# Patient Record
Sex: Male | Born: 1935 | Race: White | Hispanic: No | Marital: Married | Smoking: Former smoker
Health system: Southern US, Community
[De-identification: ages and names within clinical notes are randomized; demographics above are authoritative.]

## PROBLEM LIST (undated history)

## (undated) DIAGNOSIS — Z951 Presence of aortocoronary bypass graft: Secondary | ICD-10-CM

## (undated) DIAGNOSIS — Z8679 Personal history of other diseases of the circulatory system: Secondary | ICD-10-CM

## (undated) DIAGNOSIS — I10 Essential (primary) hypertension: Secondary | ICD-10-CM

## (undated) DIAGNOSIS — R7881 Bacteremia: Secondary | ICD-10-CM

## (undated) DIAGNOSIS — Z87438 Personal history of other diseases of male genital organs: Secondary | ICD-10-CM

## (undated) DIAGNOSIS — I499 Cardiac arrhythmia, unspecified: Secondary | ICD-10-CM

## (undated) DIAGNOSIS — N183 Chronic kidney disease, stage 3 unspecified: Secondary | ICD-10-CM

## (undated) DIAGNOSIS — C61 Malignant neoplasm of prostate: Secondary | ICD-10-CM

## (undated) DIAGNOSIS — Z8711 Personal history of peptic ulcer disease: Secondary | ICD-10-CM

## (undated) DIAGNOSIS — B951 Streptococcus, group B, as the cause of diseases classified elsewhere: Secondary | ICD-10-CM

## (undated) DIAGNOSIS — Z95810 Presence of automatic (implantable) cardiac defibrillator: Secondary | ICD-10-CM

## (undated) DIAGNOSIS — I071 Rheumatic tricuspid insufficiency: Secondary | ICD-10-CM

## (undated) DIAGNOSIS — N134 Hydroureter: Secondary | ICD-10-CM

## (undated) DIAGNOSIS — I255 Ischemic cardiomyopathy: Secondary | ICD-10-CM

## (undated) DIAGNOSIS — Q6211 Congenital occlusion of ureteropelvic junction: Secondary | ICD-10-CM

## (undated) DIAGNOSIS — Z950 Presence of cardiac pacemaker: Secondary | ICD-10-CM

## (undated) DIAGNOSIS — I509 Heart failure, unspecified: Secondary | ICD-10-CM

## (undated) DIAGNOSIS — H9190 Unspecified hearing loss, unspecified ear: Secondary | ICD-10-CM

## (undated) DIAGNOSIS — I480 Paroxysmal atrial fibrillation: Secondary | ICD-10-CM

## (undated) DIAGNOSIS — I2510 Atherosclerotic heart disease of native coronary artery without angina pectoris: Secondary | ICD-10-CM

## (undated) DIAGNOSIS — N5089 Other specified disorders of the male genital organs: Secondary | ICD-10-CM

## (undated) HISTORY — DX: Atherosclerotic heart disease of native coronary artery without angina pectoris: I25.10

## (undated) HISTORY — DX: Presence of aortocoronary bypass graft: Z95.1

## (undated) HISTORY — DX: Personal history of other diseases of the circulatory system: Z86.79

## (undated) HISTORY — DX: Hydroureter: N13.4

## (undated) HISTORY — DX: Congenital occlusion of ureteropelvic junction: Q62.11

## (undated) HISTORY — DX: Other specified disorders of the male genital organs: N50.89

## (undated) HISTORY — DX: Personal history of other diseases of male genital organs: Z87.438

## (undated) HISTORY — DX: Presence of cardiac pacemaker: Z95.0

## (undated) HISTORY — DX: Ischemic cardiomyopathy: I25.5

## (undated) HISTORY — PX: TRANSURETHRAL RESECTION OF PROSTATE: SHX73

## (undated) HISTORY — DX: Personal history of peptic ulcer disease: Z87.11

## (undated) HISTORY — PX: INSERT / REPLACE / REMOVE PACEMAKER: SUR710

---

## 1978-03-16 DIAGNOSIS — Z8711 Personal history of peptic ulcer disease: Secondary | ICD-10-CM

## 1978-03-16 HISTORY — DX: Personal history of peptic ulcer disease: Z87.11

## 1984-03-16 DIAGNOSIS — Z951 Presence of aortocoronary bypass graft: Secondary | ICD-10-CM

## 1984-03-16 HISTORY — PX: CORONARY ARTERY BYPASS GRAFT: SHX141

## 1984-03-16 HISTORY — DX: Presence of aortocoronary bypass graft: Z95.1

## 1998-04-15 ENCOUNTER — Encounter: Payer: Self-pay | Admitting: Emergency Medicine

## 1998-04-15 ENCOUNTER — Inpatient Hospital Stay (HOSPITAL_COMMUNITY): Admission: EM | Admit: 1998-04-15 | Discharge: 1998-04-27 | Payer: Self-pay | Admitting: Emergency Medicine

## 1998-04-26 ENCOUNTER — Encounter: Payer: Self-pay | Admitting: Cardiology

## 2000-03-16 DIAGNOSIS — Z950 Presence of cardiac pacemaker: Secondary | ICD-10-CM

## 2000-03-16 HISTORY — DX: Presence of cardiac pacemaker: Z95.0

## 2002-03-24 ENCOUNTER — Ambulatory Visit (HOSPITAL_COMMUNITY): Admission: RE | Admit: 2002-03-24 | Discharge: 2002-03-25 | Payer: Self-pay | Admitting: Cardiology

## 2002-03-24 ENCOUNTER — Encounter: Payer: Self-pay | Admitting: Cardiology

## 2004-09-27 ENCOUNTER — Emergency Department: Payer: Self-pay | Admitting: Emergency Medicine

## 2004-10-02 ENCOUNTER — Emergency Department: Payer: Self-pay | Admitting: Emergency Medicine

## 2005-01-08 ENCOUNTER — Inpatient Hospital Stay: Payer: Self-pay | Admitting: Vascular Surgery

## 2005-03-24 ENCOUNTER — Ambulatory Visit (HOSPITAL_COMMUNITY): Admission: RE | Admit: 2005-03-24 | Discharge: 2005-03-25 | Payer: Self-pay | Admitting: *Deleted

## 2005-03-24 DIAGNOSIS — Z95810 Presence of automatic (implantable) cardiac defibrillator: Secondary | ICD-10-CM

## 2005-03-24 HISTORY — DX: Presence of automatic (implantable) cardiac defibrillator: Z95.810

## 2005-08-07 ENCOUNTER — Emergency Department: Payer: Self-pay | Admitting: Emergency Medicine

## 2005-11-13 ENCOUNTER — Inpatient Hospital Stay (HOSPITAL_COMMUNITY): Admission: AD | Admit: 2005-11-13 | Discharge: 2005-11-16 | Payer: Self-pay | Admitting: *Deleted

## 2005-11-13 ENCOUNTER — Encounter (INDEPENDENT_AMBULATORY_CARE_PROVIDER_SITE_OTHER): Payer: Self-pay | Admitting: Cardiology

## 2006-02-19 ENCOUNTER — Ambulatory Visit: Admission: RE | Admit: 2006-02-19 | Discharge: 2006-02-19 | Payer: Self-pay | Admitting: *Deleted

## 2007-03-17 DIAGNOSIS — N134 Hydroureter: Secondary | ICD-10-CM

## 2007-03-17 HISTORY — DX: Hydroureter: N13.4

## 2007-03-22 ENCOUNTER — Emergency Department (HOSPITAL_COMMUNITY): Admission: EM | Admit: 2007-03-22 | Discharge: 2007-03-22 | Payer: Self-pay | Admitting: Emergency Medicine

## 2007-05-05 ENCOUNTER — Ambulatory Visit: Payer: Self-pay | Admitting: Gastroenterology

## 2007-05-27 ENCOUNTER — Ambulatory Visit: Payer: Self-pay | Admitting: Family Medicine

## 2007-06-03 ENCOUNTER — Ambulatory Visit: Payer: Self-pay | Admitting: Urology

## 2007-06-06 ENCOUNTER — Ambulatory Visit: Payer: Self-pay | Admitting: Urology

## 2007-08-10 ENCOUNTER — Ambulatory Visit: Payer: Self-pay | Admitting: Oncology

## 2007-08-15 ENCOUNTER — Ambulatory Visit: Payer: Self-pay | Admitting: Oncology

## 2007-08-15 ENCOUNTER — Emergency Department: Payer: Self-pay | Admitting: Emergency Medicine

## 2007-09-14 ENCOUNTER — Ambulatory Visit: Payer: Self-pay | Admitting: Oncology

## 2007-11-09 ENCOUNTER — Ambulatory Visit: Payer: Self-pay | Admitting: Cardiothoracic Surgery

## 2007-11-09 ENCOUNTER — Encounter (INDEPENDENT_AMBULATORY_CARE_PROVIDER_SITE_OTHER): Payer: Self-pay | Admitting: Cardiology

## 2007-11-09 ENCOUNTER — Inpatient Hospital Stay (HOSPITAL_COMMUNITY): Admission: EM | Admit: 2007-11-09 | Discharge: 2007-11-15 | Payer: Self-pay | Admitting: Emergency Medicine

## 2007-11-10 ENCOUNTER — Encounter (INDEPENDENT_AMBULATORY_CARE_PROVIDER_SITE_OTHER): Payer: Self-pay | Admitting: Cardiology

## 2007-11-11 ENCOUNTER — Encounter (INDEPENDENT_AMBULATORY_CARE_PROVIDER_SITE_OTHER): Payer: Self-pay | Admitting: Cardiology

## 2008-04-10 ENCOUNTER — Ambulatory Visit: Payer: Self-pay | Admitting: Urology

## 2008-04-18 ENCOUNTER — Ambulatory Visit: Payer: Self-pay | Admitting: Urology

## 2009-05-13 ENCOUNTER — Emergency Department: Payer: Self-pay | Admitting: Emergency Medicine

## 2010-05-06 HISTORY — PX: OTHER SURGICAL HISTORY: SHX169

## 2010-07-29 NOTE — H&P (Signed)
NAME:  YSMAEL, HIRES NO.:  [id number]   MEDICAL RECORD NO.:  [id number]          PATIENT TYPE:  EMS   LOCATION:  MAJO                         FACILITY:  [facility]   PHYSICIAN:  Madaline Savage, M.D.DATE OF BIRTH:  06-Mar-1935   DATE OF ADMISSION:  11/09/2007  DATE OF DISCHARGE:                              HISTORY & PHYSICAL   CHIEF COMPLAINT:  Exertional chest pain.   HISTORY OF PRESENT ILLNESS:  [name] is a 76 year old male, followed  by Dr. Elsie Lincoln and Dr. Jerl Mina in [place].  He has a history of  coronary disease.  He had bypass surgery in 1986 with LIMA to the LAD, a  vein graft to the RCA, and vein graft to the OM.  He did have a history  of ischemic cardiomyopathy with an EF of 25% to 35% in the past,  although his most recent echocardiogram from August of 2007 at [facility]  showed his EF to be 55% to 60%.  His last catheterization was in 2004.  At that time, he had an occluded vein graft to the RCA and an occluded  RCA with collaterals from the left.  The vein graft to the OM and the  LIMA to the LAD were patent.  There was some talk about possible redo  surgery at that time and the patient was seen by Dr. Cornelius Moras, but he was  doing well clinically and it was decided to just treat him medically.  He has had several interventions in the past, including December of  1994, July of 1995, and I believe again in 2000.  In 2000, he had had an  acute DMI with complete heart block and received a [employer] pacemaker  implant.  This was changed out January of 2007.  He had previously been  followed by Dr. Jenne Campus and now sees Dr. Lynnea Ferrier.  He last saw Dr.  Lynnea Ferrier in July.  At some point, his Coumadin has been discontinued; he  was on this for atrial fibrillation.  Apparently he had gone to the  emergency room, possibly in [place] with some abdominal pain and they  told him to stop his Coumadin.  He has had a history of PAF.   Today he presents to the  emergency room with complaints of left-sided  chest pain that radiated up into his throat and jaw.  It seemed to come  on when he was walking.  It was associated with shortness of breath.  It  was relieved with rest and recurred again when he started to resume his  walk.  He came to the emergency room for further evaluation.  Currently  he is pain-free.  He is congenitally deaf and his history is via an  interpreter; his wife is also deaf.   PAST MEDICAL HISTORY:  1. Treated hypertension.  2. Treated dyslipidemia.  3. His last creatinine from the hospital was 1.5 in 2007.  4. There was mention of a small abdominal aortic aneurysm; the last      abdominal CT showed this to be 3.6 x 4 in August of 2007.  CURRENT MEDICATIONS:  1. Simvastatin 20 mg a day.  2. Atenolol 50 mg a day.  3. Diovan/hydrochlorothiazide 160/12.5 daily.  4. Imdur 30 mg a day.  5. Pepcid 20 mg a day.  6. Aspirin 81 mg a day.  7. Nexium 40 mg a day.  8. Nitroglycerin sublingual p.r.n.   ALLERGIES:  He has no known drug allergies.   SOCIAL HISTORY:  He is married.  He has never smoked.  He works at  [employer]; he does do some heavy lifting.   FAMILY HISTORY:  Unremarkable for coronary disease.   REVIEW OF SYSTEMS:  Essentially unremarkable except for noted above.  He  denies any fever or chills.  He denies any melena.   PHYSICAL EXAMINATION:  VITAL SIGNS:  Blood pressure 165/99, pulse 62, O2  sats 96%, temp 97.9.  GENERAL:  He is a well-developed, well-nourished male, in no acute  distress.  HEENT: Normocephalic.  Extraocular movements are intact.  Sclerae are  not icteric.  He wears glasses.  NECK:  Without JVD or bruit.  CHEST:  Clear to auscultation percussion.  CARDIAC:  Reveals a regular rate and rhythm; he does have a 2/6 MR  murmur.  ABDOMEN:  Nontender.  No bruits.  I could discern enlarged aortic  pulsation.  There were bowel sounds.  EXTREMITIES:  Without edema.  Distal pulses are  3+/4 bilaterally.  There  are no femoral bruits.  NEURO EXAM:  Grossly intact.  He is awake, alert and oriented, and  cooperative.  Moves all extremities without obvious deficit.  SKIN:  Warm and dry.   EKG is paced.  His initial enzymes are negative.  Renal profile shows  sodium 139, potassium 4.2, BUN 25, creatinine 1.3.  White count 7.7,  hemoglobin 13.8, hematocrit 40.5, platelets 204.   IMPRESSIONS:  1. Unstable angina.  2. New mitral regurgitation murmur; this was not mentioned on his      echocardiogram in August of 2007.  3. Coronary artery bypass grafting in 1986, with subsequent      interventions in December of 1994, July of 1995, and in February of      2000, with ultimate occlusion of the vein graft to the right      coronary artery in 2004, treated medically.  4. History of ischemic cardiomyopathy, with an ejection fraction of      25% to 35% in the past, although his most recent echocardiogram      from August of 2007 shows his ejection fraction to be 55% to 60%.  5. Permanent pacemaker implant in February of 2000 for complete heart      block in the setting of a myocardial infarction, generator upgraded      in January of 2007, followed by Dr. Lynnea Ferrier.  6. Past history of mild aortic enlargement, 3.6 x 4 by CT scan in      August of 2007.  7. Treated hypertension.  8. Treated dyslipidemia.  9. Congenital deafness.   PLAN:  The patient will be admitted to telemetry and started on IV  heparin.  We will continue his nitrates.  Will stop his Nexium and  continue Pepcid.  He will need another echocardiogram to evaluate his  MR.  He may need another catheterization.  He will be seen by Dr. Elsie Lincoln  today in the emergency room.      Abelino Derrick, P.A.    ______________________________  Madaline Savage, M.D.    Lenard Lance  D:  11/09/2007  T:  11/09/2007  Job:  [id number]

## 2010-07-29 NOTE — Discharge Summary (Signed)
Thomas [name], Thomas [name]                ACCOUNT NO.:  [id number]   MEDICAL RECORD NO.:  [id number]          PATIENT TYPE:  INP   LOCATION:  3704                         FACILITY:  [facility]   PHYSICIAN:  Madaline Savage, M.D.DATE OF BIRTH:  11-18-1934   DATE OF ADMISSION:  11/09/2007  DATE OF DISCHARGE:  11/15/2007                               DISCHARGE SUMMARY   DISCHARGE DIAGNOSES:  1. Unstable angina negative myocardial infarction.  2. Coronary artery disease with history of bypass grafting in 1986 and      now saphenous vein graft to the right coronary artery occluded,      saphenous vein graft to obtuse marginal, one limb was patent,      second limb had 95% stenosis.  On initial cath left internal      mammary artery (graft) difficult to visualize.      Thomas.     Re-cath brachial artery and the left internal mammary artery       (graft) was found to be patent and distal one half of the left       anterior descending was patent, TIMI flow was 3.  3. Mitral regurgitation murmur with trace mitral regurgitation on 2-D      echo, no evidence for mitral stenosis.  4. History of ischemic cardiomyopathy ejection fraction had been 25-      35% in the past, now 55-60% by echo.  Most recent echo ejection      fraction is 40% and not done during this admission.  5. History of complete heart block with permanent transvenous      pacemaker most recently upgraded January 2007.  6. History of mild aortic enlargement and stable.  7. Congenital deafness, does read instructions as well as sign      language with interpreter.  8. Treated hypertension.  9. Treated dyslipidemia.   DISCHARGE CONDITION:  Improved.   PROCEDURES:  Combined left heart cath with graft visualization November 10, 2007, and follow up brachial approach to visualize the left internal  mammary artery (graft) and the left anterior descending was done November 14, 2007.   DISCHARGE MEDICATIONS:  1. Simvastatin 20 mg every evening.  2. Atenolol 50 mg daily.  3. Diovan HCT 160/12.5 daily.  4. Imdur 30 mg daily.  5. Pepcid 20 mg daily.  6. Aspirin 81 mg daily.  7. Nexium 40 mg daily.  8. Nitroglycerin sublingual 0.4 as needed as before.   DISCHARGE INSTRUCTIONS:  1. Increase activity slowly.  May shower.  No lifting for 2 days.  No      driving for 2 days.  2. Low-sodium heart-healthy diet.  3. May return to work on November 21, 2007, but no lifting over 20      pounds.  4. Wash cath site with soap and water.  Call if any bleeding, swelling      or drainage.  5. Follow up with Dr. Elsie Lincoln on December 06, 2007, at 11:30 Thomas.m.   HISTORY OF PRESENT ILLNESS:  Thomas [name] who  was seen on November 09, 2007, in the emergency room at [facility] with left-  sided chest pain with radiation to his throat and jaw.  It occurred  while walking associated with shortness of breath, relieved with rest  and reoccurred when he started to resume as well.  He was pain-free in  the emergency room.  Information was obtained through an interpreter.   Other history includes hypertension, dyslipidemia all treated, last  creatinine was 1.5 in 2007.  He has Thomas small abdominal aneurysm.  The  last abdominal CT was 3.6 to 4 in August 2007.   FAMILY HISTORY, SOCIAL HISTORY, AND REVIEW OF SYSTEMS:  See H&P.   PHYSICAL EXAMINATION AT DISCHARGE:  VITAL SIGNS:  Blood pressure 122/81,  pulse 76, respiratory rate 20, temperature 98, oxygen saturation room  air 95%.  HEART:  S1 and S2, regular rate and rhythm.  No gallop, no murmur.  LUNGS:  Clear without rales, rhonchi or wheezes.   Left brachial site was good without hematoma, right groin cath site was  stable without hematoma.   LABORATORY DATA:  Admitting labs, hemoglobin 13.8, hematocrit 40.5, WBC  7.7, platelets 204, MCV 87.9, and neutrophils 64.   These remained stable throughout hospitalization.  Prior to discharge,  hemoglobin 13.9, hematocrit 41, WBC 6.7,  platelets 176, and MCV 89.1.   Chemistry on admission sodium 139, potassium 4.2, chloride 106, CO2 24,  BUN 24, creatinine 1.24 and glucose 92.  These remained stable at  discharge, sodium 136, potassium 4.1, chloride 105, CO2 23, BUN 24,  creatinine 1.42 and glucose 105.   Coags, on November 15, 2007, PT 14.3, INR 1.1 and heparin was less than  0.10.   AST 28, ALT 27, alkaline phos 56, total bili 1, albumin 4, cardiac  markers were all negative.  CKs range 123-91, MBs 4.1-3.2 and troponin-I  0.03-0.02.   Total cholesterol 117, LDL 71, HDL 28, and triglycerides 89.   Calcium 9.3, TSH was normal at 2.23 and UA was negative.   Chest x-ray done January 09, 2008, stable cardiomegaly, no acute  findings.  Two-D echo that was done November 09, 2007, left ventricular  systolic function was mildly depressed, EF 40%, wall motion  abnormalities difficult to interpret due to challenging images though  there appeared to be mild to moderate inferior posterior hypokinesis and  possibly distal anteroseptal and apical hypokinesis, normal RV size and  systolic function, pacer wire in place, trace MR, TR and PR, mildly  dilated LA and RA compared to previous study dated August 2007, the EF  now appears to be mildly depressed.   EKGs on admission the patient is atrially pacing at times.  He has  underlying complete heart block and ventricular pacing and sensing  appropriately.  Followup EKGs reveal the same, no acute injury pattern.   HOSPITAL COURSE:  The patient was admitted due to chest pain and also he  was found to have Thomas loud MR murmur on admission.  Please note the  patient does have 2-3/6 systolic murmur.   The patient was admitted to rule out MI.  He underwent cardiac  catheterization.  It was difficult to assess his LIMA.  Consultation  with Dr. Tyrone Sage for possible redo bypass grafting was done.  He felt  we needed to define the LIMA prior to proceeding with any other  surgeries.  Dr.  Elsie Lincoln took him back to the Cath Lab and did Thomas cath  through his left brachial artery.  The LIMA was patent.  Distal  half of  the LAD was patent.  LIMA to the LAD no more than 30% narrow, TIMI flow  was 3.  He felt he was stable and needed no further intervention and by  November 15, 2007, Mr. Streett was stable and ready for discharge home.  He will follow up as an outpatient.      Darcella Gasman. Annie Paras, N.P.    ______________________________  Madaline Savage, M.D.    LRI/MEDQ  D:  11/15/2007  T:  11/16/2007  Job:  [id number]   cc:   Sheliah Plane, MD  Jerl Mina

## 2010-07-29 NOTE — Cardiovascular Report (Signed)
NAMERUFUS, BESKE                ACCOUNT NO.:  [id number]   MEDICAL RECORD NO.:  [id number]          PATIENT TYPE:  INP   LOCATION:  3704                         FACILITY:  [facility]   PHYSICIAN:  Madaline Savage, M.D.DATE OF BIRTH:  02-Oct-1934   DATE OF PROCEDURE:  11/10/2007  DATE OF DISCHARGE:                            CARDIAC CATHETERIZATION   PROCEDURES PERFORMED:  1. Selective coronary angiography by Judkins technique.  2. Selective visualization of the saphenous vein graft to the obtuse      marginal branch of the circumflex.  3. Selective visualization of the occluded vein graft to the right      coronary artery known to be chronically totally occluded.  4. Selective visualization of the native right coronary artery 100%      occluded.  5. Unable to selectively engage the internal mammary artery graft but      nonselective visualization of the left internal mammary artery by      way of a subclavian injection.  6. Left ventricle not visualized because unable to cross the aortic      valve due to marked tortuosity.   PATIENT PROFILE:  Thomas [name] is a 76 year old congenitally deaf gentleman who  has had known coronary disease dating back to the 90s.  He underwent  coronary artery bypass grafting in [place] in 1986.  Thomas [name] came under  my care as far back as 1995 and has had 6 caths prior to today.  He has  had percutaneous interventions performed on his coronary vasculature.  His last cath in 2004 showed the left internal mammary artery graft to  the LAD was patent, the saphenous vein graft to obtuse marginal was  patent, and the saphenous vein graft to RCA was occluded.   The patient presented to the emergency room yesterday, November 09, 2007  and due to chest pain, his cardiac enzymes were negative.  His EKG was  nondiagnostic because it was a paced rhythm with underlying atrial fib.  He was admitted for overnight observation.  His subsequent enzymes have  been negative.   Today's cardiac catheterization was technically  difficult and technically limited for that reason.   RESULTS:  1. Native coronaries.      a.     Left main coronary, okay.      b.     LAD, proximal 95%.      c.     Circumflex, proximal 95%.      d.     RCA, ostial 100%, no antegrade flow into the distal RCA.  2. Grafts.      a.     Left internal mammary artery to LAD patent and only faintly       visualized, but there appears to be a stenosis near the insertion       site of the LIMA into the LAD, that is 95% severe with what       appears to be a normal LAD distal.      b.     Saphenous vein graft to RCA, occluded at proximal  anastomotic.      c.     Saphenous vein graft to obtuse marginal branch, one limb       patent, the second limb 95% stenosed.  3. Aortic valve, unable to cross.   PLAN:  The patient will have a cardiac surgical consultation to see if  there is feasibility of salvage to the distal LAD and possibly to that  one limb of the obtuse marginal and the patient will be kept under  observation until that consultation is complete.           ______________________________  Madaline Savage, M.D.     WHG/MEDQ  D:  11/10/2007  T:  11/10/2007  Job:  [id number]   cc:   [facility]  [facility]

## 2010-07-29 NOTE — Cardiovascular Report (Signed)
NAMEMarland Kitchen  SHION, BLUESTEIN NO.:  [id number]   MEDICAL RECORD NO.:  [id number]          PATIENT TYPE:  INP   LOCATION:  3704                         FACILITY:  [facility]   PHYSICIAN:  Madaline Savage, M.D.DATE OF BIRTH:  September 05, 1934   DATE OF PROCEDURE:  11/14/2007  DATE OF DISCHARGE:                            CARDIAC CATHETERIZATION   PROCEDURE PERFORMED:  Selective visualization of the left internal  mammary artery graft by a left brachial approach.  No intervention  performed.   COMPLICATIONS:  None.   ENTRY SITE:  Left brachial.   DYE USED:  Omnipaque.   PATIENT PROFILE:  Thomas [name] is a delightful, 76 year old congenitally  deaf individual who I have cared for for many years.  He recently came  to the hospital with chest pain that was possibly anginal.  He is known  to have graft dysfunction from previous coronary artery bypass grafting  performed in 1986 in [place].  His ejection fraction has been 55-60%.  He does have hypertension and treated hyperlipidemia.  His creatinine  has been 1.5 in the past.   The patient underwent diagnostic cardiac catheterization from the right  percutaneous femoral approach and it was noted that he had a saphenous  vein graft to the right coronary artery occluded, a saphenous vein graft  to obtuse marginal branch patent with one limb of that graft being 95%  stenosed, and he had a patent stent graft in his distal aorta at that  was also patent.  Because I was unable to selectively intubate the left  internal mammary artery from the groin approach due to marked  tortuosity, I planned this procedure today to reinvestigate the vascular  status of his left internal mammary artery graft to LAD.   FINDINGS:  The patient's left brachial artery and axillary artery were  large and widely patent.  The left internal mammary artery graft off the  subclavian was widely patent and the LIMA distally filled briskly.  The  insertion site of  the LIMA into the LAD looked a little narrow, but no  more than 30%.  There was TIMI III flow throughout with filling of small  diagonal branches and the apex LAD bifurcation point of the LAD could be  seen.   FINAL IMPRESSION:  Status of left internal mammary to left anterior  descending was good.  No more than 30% area of narrowing.   PLAN:  Continued medical therapy of his coronary artery disease and  overnight observation following this brachial catheterization.          ______________________________  Madaline Savage, M.D.    WHG/MEDQ  D:  11/14/2007  T:  11/14/2007  Job:  [id number]   cc:   [facility]

## 2010-07-29 NOTE — Consult Note (Signed)
NAME:  Thomas [name], Thomas [name] NO.:  [id number]   MEDICAL RECORD NO.:  [id number]          PATIENT TYPE:  INP   LOCATION:  3704                         FACILITY:  [facility]   PHYSICIAN:  Sheliah Plane, MD    DATE OF BIRTH:  September 22, 1934   DATE OF CONSULTATION:  11/11/2007  DATE OF DISCHARGE:                                 CONSULTATION   PRIMARY CARE DOCTOR:  Dr. Jerl Mina, [place].   REASON FOR CONSULTATION:  Question of suitability for redo coronary  artery bypass grafting.   CHIEF COMPLAINT:  Exertional chest pain.   HISTORY OF PRESENT ILLNESS:  The patient is a 76 year old male who has a  history of known coronary artery disease.  He had coronary artery bypass  graft in 1986 with left internal mammary to the LAD and a vein graft to  the right coronary artery and a vein graft to the obtuse marginal.  He  has had a history of ischemic cardiomyopathy with ejection fraction  between 25-35% in the past.  His last catheterization was in 2004.  At  that time, he had an occluded vein graft to the right with collaterals  from the left.  He was seen by Dr. Cornelius Moras at that time, and on  consultation with Cardiology, it was decided to proceed with medical  therapy.  The patient has done well until the last several days when he  was readmitted with exertional substernal chest discomfort radiating  into his neck and slightly into his arms, relieved by rest.  Since  hospitalization, he has had no further pain.  Cardiac catheterization  was performed yesterday.   The patient's history is obtained from his chart and also directly from  the patient with a sign language interpreter because of the patient's  congenital deafness.   PAST MEDICAL HISTORY:  Significant for hypertension and dyslipidemia,  treated.   Baseline creatinine is 1.5.   The patient has had abdominal stents placed in his abdominal aorta for  abdominal aortic aneurysm.  The details of this are not currently  available.   Since his original surgery in 1986, he has had interventions to the  distal right coronary artery in July 1995.  Beyond the insertion of the  saphenous vein graft 1997, underwent angioplasty of a diagonal branch.  In January of 2000, the patient had a myocardial infarction and  underwent placement of a permanent pacemaker for bradycardia.   Cardiolite stress test done in February 2009 showed a 40% ejection  fraction and no ischemia.   The patient is a nonsmoker, denies diabetes.   FAMILY HISTORY:  There is no history of early onset of coronary artery  disease in his family.   SOCIAL HISTORY:  The patient lives with his wife who is also deaf.  He  is a nonsmoker.  Denies alcohol use.  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   CURRENT MEDICATIONS:  Simvastatin, atenolol, Diovan, Imdur, Pepcid,  aspirin, Nexium, and nitroglycerin.   REVIEW OF SYSTEMS:  The patient, other  than recent anginal chest pain,  has remained physically active.  Denies fever, chills, or night sweats.  Denies PND, orthopnea, or lower extremity edema.  Denies hemoptysis.  Denies hematochezia.  He had previously been on Coumadin, which was  stopped because of some abdominal discomfort, last year.  History of  PAF.   PHYSICAL EXAMINATION:  VITAL SIGNS:  Blood pressure is 160/80, sinus at  62.  The patient is awake and alert in no distress.  HEENT:  Pupils are equal, round, and reactive to light.  He is obviously  deaf, but he is able to communicate with sign language.  LUNGS:  Clear bilaterally.  CARDIAC:  Regular rate and rhythm.  Does have a 2/6 murmur of mitral  insufficiency.  ABDOMEN:  He has a easily palpable abdominal pulsation.   LABORATORY FINDINGS:  Creatinine of 1.3.   Echocardiogram was performed on November 09, 2007, left ventricular  systolic function is mildly depressed with ejection fraction of 40% with  mild-to-moderate inferior  posterior hypokinesis, normal right  ventricular size, trace mitral regurgitation, tricuspid regurgitation,  mildly dilated left and right atrium.  This was compared to a study done  in August 2007, ejection fraction appears improved.   Cardiac catheterization, films from 2004 and yesterday were reviewed.  The patient's right graft is occluded, there is no obvious collateral  filling of the distal PD as it was in 2004.  The vein graft to the  obtuse marginal is patent, second more distal circumflex branch is a  small vessel feeds off the first obtuse marginal, and it now has a 70-  80% lesion in the midportion of it that was not present in 2004.  The  left internal mammary is patent, but it is underfilled and it is  difficult to discern if there is any significant disease at the  anastomosis or at the distal portion of the LAD.  On previous  catheterization, there was some degree of stenosis at the anastomosis  and also stenosis in a very distal LAD with distal LAD disease.   IMPRESSION:  The patent's history and cardiac catheterization films are  discussed and reviewed with Dr. Elsie Lincoln.  At this point, there is no  suitable vessel to bypass to the right.  The vein graft to the obtuse  marginal is patent, I would not recommend redo coronary artery bypass  grafting just to do a second small distal circumflex branch.  The  question of therapy really revolves around, does the patient have  bypassable disease or angioplastable disease in the left anterior  descending coronary artery versus diffuse disease in the left anterior  descending coronary artery that would not be amendable to redo coronary  artery bypass grafting.  Part of this is because of technical  difficulties in visualizing the mammary artery.  At this point, Dr.  Elsie Lincoln and I discussed role of CT angiography versus repeat cardiac  catheterization from the arm approach to further delineate the degree of  disease in the left  anterior descending coronary artery and mammary  graft.  If there is disease suitable for angioplasty, this could be  performed at the same time versus if there is disease, the patient could  be a candidate for redo bypass surgery from the medical  standpoint, if there is not extensive distal disease in left anterior  descending.  Dr. Elsie Lincoln is to review the films with Dr. Mariah Milling involved  in CT angio and potentially schedule the patient for repeat cardiac  catheterization.  This was all explained to the patient through the  interpreter and he is agreeable with approach.      Sheliah Plane, MD  Electronically Signed     EG/MEDQ  D:  11/11/2007  T:  11/12/2007  Job:  [id number]   cc:   Madaline Savage, M.D.

## 2010-08-01 NOTE — Discharge Summary (Signed)
NAMEKENDARIOUS, Thomas [name]                ACCOUNT NO.:  [id number]   MEDICAL RECORD NO.:  [id number]          PATIENT TYPE:  INP   LOCATION:  4729                         FACILITY:  [facility]   PHYSICIAN:  Raymon Mutton, P.A. DATE OF BIRTH:  1934/05/11   DATE OF ADMISSION:  11/13/2005  DATE OF DISCHARGE:  11/16/2005                                 DISCHARGE SUMMARY   DISCHARGE DIAGNOSES:  1. New onset atrial fibrillation.  The patient was admitted for heparin      Coumadin crossover.  2. Known coronary artery disease status post coronary artery bypass      grafting in the past.  3. Ischemic cardiomyopathy with EF 25-35%.  4. Status post percutaneous transvenous pacemaker secondary to heart      block.  5. Congenital heart disease.  6. Abdominal aortic aneurysm.  7. Hypertension.  8. Hyperlipidemia.   HISTORY/HOSPITAL COURSE:  This is a 76 year old Caucasian gentleman with  previous history of coronary artery disease, who presented to our office on  November 13, 2005 for a regular pacemaker checkup and during that check up was  found to be in atrial fibrillation, which was a new onset for the patient.  For that reason, he was admitted to [facility] to undergo heparin  Coumadin crossover.   The patient essentially did not have any complaints of chest pain, shortness  of breath, dizziness, lightheadedness or palpitations and does not know for  how long he has been out of a normal sinus rhythm and having atrial  fibrillation, but with patient interrogation reveals that he has been in  atrial fibrillation for the last month.   He underwent 2D on the day of admission, November 13, 2005 and showed overall  left ventricular systolic function was normal, and EF estimated at 55-65%,  with some mild hypokinesis of the mid distal inferior wall and mild  hypokinesis of the mid distal posterior lateral wall.  Left ventricular and  diastolic pressure was elevated, and it was not at that left,  atrium was  markedly dilated and  right atrium was mildly to moderately dilated.   His laboratories during this date reveals normal CBC with hemoglobin 15.1,  hematocrit 24.6, white blood cell count 7.7 and platelets 203.  BMP was  normal with sodium 138, potassium 4.1, BUN 21, creatinine 1.5 and TSH was  within normal limits.   The patient tolerated the load of heparin and Coumadin with no complications  and on November 16, 2005, his INR was close to therapeutic - 1.9, with goal  being 2-3.   He was assessed by Dr. Allyson Sabal and felt to be stable for discharge home.   DISCHARGE ACTIVITIES:  No restrictions as tolerated.   DISCHARGE DIET:  Low-salt, low-cholesterol, low-fat diet.   DISCHARGE MEDICATIONS:  1. Zocor 20 mg every day.  2. Niacin 500 mg every day.  3. Diovan 160/12.5 mg every day.  4. Imdur 30 mg every day.  5. Aspirin 81 mg every day.  6. He was on Coumadin 6.5 p.o. every daily, and we are going to keep him  on the same dose.   DISCHARGE INSTRUCTIONS:  He is to have his blood work done on Thursday, and  the results will be faxed over to our office for evaluation.   DISCHARGE FOLLOWUP:  Dr. Jenne Campus will see patient in 2-3 weeks in our  office, and the office will call patient with appointment date and time.      Raymon Mutton, P.A.     MK/MEDQ  D:  11/16/2005  T:  11/16/2005  Job:  [id number]   cc:   [facility]

## 2010-08-01 NOTE — H&P (Signed)
[name], [name] NO.:  [id number]   MEDICAL RECORD NO.:  [id number]           PATIENT TYPE:   LOCATION:                                 FACILITY:   PHYSICIAN:  Darlin Priestly, MD  DATE OF BIRTH:  [birth year]   DATE OF ADMISSION:  11/13/2005  DATE OF DISCHARGE:                                HISTORY & PHYSICAL   CHIEF COMPLAINT:  No complaints.   REASON FOR ADMISSION:  New-onset atrial fibrillation without  anticoagulation.   HISTORY OF PRESENT ILLNESS:  A 76 year old deaf white male with history of  coronary disease and permanent pacemaker was seen in our office today,  November 13, 2005, for a permanent pacemaker check.  He has Medtronic Adapta  model #[id number].  On that interrogation, he was found to be in atrial  fibrillation.  The heart rate is controlled.  He appeared to have gone into  the atrial fib on August 24, 2005.   Secondary to new-onset A-fib, Dr. Jenne Campus would like him admitted to [facility] for heparin/Coumadin crossover, which we have explained to the  patient by writing this down, answering questions, given a pamphlet to read  on atrial fibrillation.  He has agreed to go to the hospital for the  procedure, further evaluation and the heparin/Coumadin crossover.  Additionally discussed with Dr. Elsie Lincoln, Thomas [name] primary cardiologist.  He feels due to Thomas [name] ischemic cardiomyopathy, it would be best to  wait to cardiovert.  He does not even want a TEE cardioversion.  We will  place the patient on Coumadin, wait 6 to 8 weeks and then plan for  cardioversion.   PAST MEDICAL HISTORY:  Positive for A-fib which is new at this point.  He  has a history of coronary artery disease with bypass grafting in 1986 with  LIMA to the LAD, reverse autogenous saphenous vein graft to the distal RCA  and first obtuse marginal branch of the circumflex.  Since that time he has  had interventions including in July 1995 a PTCA of the distal RCA beyond  the  insertion of the right coronary artery graft and in December 1994 a PTCA of  the first diagonal branch of the LAD.  In 2000, he also has a history of an  acute inferior MI where he closed the saphenous vein graft to the RCA.  He  developed second- and third-degree AV block and received a permanent  pacemaker originally on April 25, 1998.  He has had a replacement of the  generator since that time with explantation of the Guidant Medtronic and  implantation of the Medtronic Adapta, and that was on March 24, 2005 by Dr.  Jenne Campus.   OTHER HISTORY:  With the acute MI, he did have ventricular tachycardia.  He  has congenital deafness, hyperlipidemia, and history of UTIs.   Last echo was December 01, 2004 when he had moderate concentric LVH; EF was  25-35% with severe apical wall hypokinesis.  There was severe septal  hypokinesis.  Apical wall motion abnormality may reflect pacemaker  activation.  The LA  was moderately dilated.  There was mild MR and mild TR,  trace aortic regurg.   Last nuclear study was December 01, 2004 which revealed moderate to large  size inferior, inferior septal, inferior apical, and inferolateral wall MI  with mild amount of superimposed ischemia.  The LV cavity was mildly dilated  and LV systolic dysfunction was mild to moderately depressed with an EF at  that time of 41%.  There was some superimposed ischemia which was new, but  the patient was asymptomatic so no further procedure was done.   His last cardiac catheterization was March 24, 2002.  He had patent LIMA to  the LAD with a 70% lesion at the insertion site and 70% apical LAD lesion  beyond the internal mammary artery insertion.  He had patent saphenous vein  graft to the obtuse marginal with a 40% proximal narrowing of the vein  graft, totally occluded vein graft to the RCA, significant 3-vessel coronary  disease.  EF was severely depressed.  Moderately dilated ascending aorta  with no  evidence of aortic regurg.  Elevated pulmonary capillary wedge  pressure with mild pulmonary hypertension.   At that point, Dr. Tressie Stalker was reconsulted for redo CABG, and he felt  that the patient was asymptomatic.  He would treat medically only at this  current time.   Other history includes pulmonary hypertension, abdominal aortic aneurysm.  I  believe this is followed by Dr. Gretta Began.  Thomas [name] tells me it has been  about a year since he has had an evaluation.  On October 03, 2004, an  infrarenal abdominal aortic aneurysm with maximal AP dimension of 4.1,  basically unchanged from May 22, 2003.  Liver cyst was noted and there was  some atrophy of the right kidney.   FAMILY HISTORY:  Mother died at 25 with a heart attack after a fall.  Father  died at 85 with liver disease.  His children are in good health.   SOCIAL HISTORY:  He is married for 25 years.  Lives with his wife.  Is a  retired Paramedic.  Does not use alcohol or tobacco.  No illicit drugs.  He tries to exercise.   REVIEW OF SYSTEMS:  GENERAL:  No specific weight changes.  No colds or  fevers.  MUSCULOSKELETAL:  Denies leg pain currently.  SKIN:  No rashes or  ulcers, only scabs where he has small cuts.  EYES:  Positive for glasses.  RESPIRATORY:  No shortness of breath.  CARDIOVASCULAR:  Denies chest pain or  edema.  GI:  No indigestion, diarrhea, constipation or melena.  GU:  No  hematuria or dysuria.  ENDOCRINE:  No diabetes or thyroid disease.  NEURO:  Denies lightheadedness, dizziness, or syncope.  HEENT:  No sinus infections,  no sore throats.  HEME:  No awareness of anemia.   VITAL SIGNS:  Today blood pressure 130/80, pulse in the 60s.   PHYSICAL EXAMINATION:  Alert and oriented white male in no acute distress.  Pleasant affect.  We are doing the exam by written notes and some pointing  to areas.  HEENT:  Sclerae are clear.  Glasses are in place.  Again, the patient is  deaf. NECK:  Supple.  No  JVD.  No bruits.  No thyromegaly.  LUNGS:  Clear without rales, rhonchi or wheezes.  HEART SOUNDS:  S1 S2.  Irregularly irregular but mildly so.  He does have a  2 to 3/6 aortic outflow murmur.  ABDOMEN:  Soft, nontender, positive bowel sounds.  Do not palpate liver,  spleen or masses.  LOWER EXTREMITIES:  Without edema, 2+ pedal, 2+ posterior tibs bilaterally.  Full range of motion of his extremities.  NEUROLOGIC:  Alert and oriented x3.  Follows commands.  Writes appropriately  to questions.  SKIN:  Warm and dry.  Brisk capillary refill.  He does have some scab on his  right hand and on his shins but no infections.   ASSESSMENT:  1. New-onset atrial fibrillation.  Plan for heparin/Coumadin crossover.  2. Coronary disease with a history of coronary artery bypass grafting and      graft dysfunction.  Currently stable.  3. Ischemic cardiomyopathy.  Ejection fraction of 25-35% per      echocardiogram a year ago.  4. Murmur.  Will repeat an echocardiogram.  5. Mild pulmonary hypertension.  6. Abdominal aortic aneurysm.  Will get CT of the abdomen while he is in      the hospital.  7. History of second- and third-degree hot block with permanent      transvenous pacemaker.  8. Congenital deafness.   PLAN:  As stated, admit to [facility], telemetry, IV heparin/Coumadin  crossover.  Will plan to anticoagulate him and then bring him back in 6-8  weeks for cardioversion per Dr. Elsie Lincoln.  Will rule out an MI as well.  If  any arrhythmias, he may be in need at some point for ICD with history of MI  as well as the EF of 20-25%.  But with the __________ study of 41%, will  wait to see what the 2-D echo is evaluating his EF.      Thomas [name]. Valarie Merino      Darlin Priestly, MD  Electronically Signed    LRI/MEDQ  D:  11/13/2005  T:  11/13/2005  Job:  [id number]   cc:   Madaline Savage, M.D.  Jerl Mina

## 2010-08-01 NOTE — Cardiovascular Report (Signed)
NAMEORESTE, MAJEED                            ACCOUNT NO.:  [id number]   MEDICAL RECORD NO.:  [id number]                   PATIENT TYPE:  OIB   LOCATION:  2857                                 FACILITY:  [facility]   PHYSICIAN:  Darlin Priestly, M.D.             DATE OF BIRTH:  [birth year]   DATE OF PROCEDURE:  03/24/2002  DATE OF DISCHARGE:                              CARDIAC CATHETERIZATION   PROCEDURES:  1. Right heart catheterization.  2. Left heart catheterization.  3. Coronary angiography.  4. Left ventriculogram.  5. Saphenous vein graft.  6. Left internal mammary angiography.  7. Ascending aortography.   COMPLICATIONS:  None.   INDICATIONS:  The patient is a 76 year old male, patient of Dr. Burnett Sheng and  Dr. Lavonne Chick, with a history of coronary artery bypass surgery in 1986 in  [place].  He also has a history of a congenital deafness, who recently has  complained of increasing chest pain.  He underwent a Cardiolite scan in  November of 2003 revealing an EF of 34% with no significant ischemia.  Because of underlying symptoms, he is now referred for repeat  catheterization.   DESCRIPTION OF PROCEDURE:  After given informed written consent, the patient  was brought to the cardiac catheterization lab where his right and left  groins were shaved, prepped, and draped in the usual sterile fashion.  ECG  monitoring was established.  Using modified Seldinger technique, a #8 French  venous sheath was inserted in the right femoral vein.  A #6 French arterial  sheath was inserted in the right femoral artery.  Next, under fluoroscopic  guidance a #7 [place] Swan-Ganz catheter was floated into the RA, RV, PA, and  wedge position and hemodynamic measurements were made.  A 7 French  diagnostic catheter was then used to perform diagnostic angiography.  This  reveals a large left main with no significant disease.   The LAD is a medium sized vessel which is totally occluded in its  proximal  portion giving rise to one diagonal branch. There is 70% proximal LAD  lesion.  The LAD is total in its proximal portion.  The mid and distal LAD  fill via patent LIMA, which inserts in its midportion.  The LIMA appears to have approximately 70% stenotic lesion at the insertion  site.  There is 70% apical LAD stenotic lesion.  There is left to right  collaterals to the distal PDA and posterolateral branch.  The first diagonal  is a medium sized vessel which bifurcates distally and has a 99% proximal  stenosis as well as a filling defect in the mid segment consistent with  either a chronic dissection or possible thrombosis.   The left circumflex is a medium sized vessel which coursed in the AV groove  and gives rise to two obtuse marginal branches and is totally occluded.  The  AV groove circumflex is noted to have  a 99% stenotic lesion just prior to  the takeoff of a second small obtuse marginal.  There are two other obtuse  marginal branches which fill via patent saphenous vein graft to the OM.  The  vein graft appears to have 30-40% proximal narrowing.  There is no  significant disease in the ring of the body of the graft.  The graft is  inserted in the OM which bifurcates distally.  There is a 40% ostial lesion  in the lower bifurcation.  This does retrograde fill of lower obtuse  marginal with no significant disease.  Again, there are left to right  collaterals of the distal RCA.   The right coronary artery is totally occluded in its proximal segment.  As  previously stated, the distal RCA fills faintly via left to right  collaterals.   Saphenous vein graft to the RCA is totally occluded at its ostium.   LEFT VENTRICULOGRAM:  The left ventriculogram reveals severely depressed EF  with 20-25% with global hypokinesis.  There is a posterobasal aneurysm which  appears akinetic.   The ascending aortography reveals a moderately dilated ascending root with  no evidence of  significant AI.  There is no vein graft to the RCA  visualized.   HEMODYNAMICS:  Right atrial pressure 11, RV 43/8, PA 42/16, wedge of 18.  Systemic arterial pressure 137/79, LV systemic pressure 136/11, LVEDP of 16,  cardiac output 6.6, cardiac index 2.9, PA saturation 70%, arterial  saturation 97%.   CONCLUSIONS:  1. Patent left internal mammary artery to the left anterior descending with     70% lesion at the insertion site and 70% apical left anterior descending     lesion beyond the internal mammary artery insertion.  2. Patent saphenous vein graft to the obtuse marginal with 40% proximal     narrowing of the vein graft.  3. Totally occluded vein graft to the right coronary artery.  4. Significant three-vessel coronary artery disease.  5. Severely depressed ejection fraction with wall motion abnormalities as     noted above.  6. Moderately dilated ascending aorta with no evidence of significant aortic     regurgitation.  7. Elevated pulmonary capillary wedge pressure with mild pulmonary     hypertension.  8. Cardiac output 6.6, cardiac index 2.9.  9. PA saturation 70%, AO saturation 97%.                                                 Darlin Priestly, M.D.    RHM/MEDQ  D:  03/24/2002  T:  03/25/2002  Job:  [id number]   cc:   Madaline Savage, M.D.  [address], [address]  [place]  [place] [address]  Fax: [phone]   Jerl Mina  [address] [place].  [place]  [place] [address]  Fax: [phone]

## 2010-08-01 NOTE — Consult Note (Signed)
Thomas [name], Thomas [name]                            ACCOUNT NO.:  [id number]   MEDICAL RECORD NO.:  [id number]                   PATIENT TYPE:  OIB   LOCATION:  4704                                 FACILITY:  [facility]   PHYSICIAN:  Salvatore Decent. Cornelius Moras, M.D.              DATE OF BIRTH:  December 29, 1934   DATE OF CONSULTATION:  03/24/2002  DATE OF DISCHARGE:                                   CONSULTATION   PHYSICIANS:  Requesting physician: Darlin Priestly, M.D.  Primary cardiologist: Madaline Savage, M.D.  Primary care physician: Jerl Mina, M.D.   REASON FOR CONSULTATION:  Three-vessel coronary artery disease with moderate  left ventricular dysfunction, abnormal stress Cardiolite exam.   HISTORY OF PRESENT ILLNESS:  The patient is a 76 year old gentleman who is  retired from the [employer] but still works part time at a International aid/development worker station.  He lives in [place] and is followed by Dr. Burnett Sheng.  He has history of congential deafness.  His entire Review of Systems is  obtained with the assistance of an interpreter with the patient's wife and  family present.  The patient has a longstanding history of coronary artery  disease originally undergoing coronary artery bypass grafting x 3 in June  1986 by Dr. Erasmo Score.  Grafts placed at the time of surgery included left  internal mammary artery to the distal left anterior descending coronary  artery, saphenous vein graft to the first circumflex marginal branch, and  saphenous vein graft to the distal right coronary artery.  The surgeon at  that time commented that the distal right coronary artery was only fair  quality vessel as was the distal left anterior descending coronary artery.  The patient initially did well.  More recently, he has been followed by Dr.  Elsie Lincoln.  He has undergone pecutaneous coronary interventions including  intervention on the distal right coronary artery in July 1995 beyond the  insertion of the saphenous  vein graft.  In 1997, the patient underwent  pecutaneous coronary intervention on his diagonal branch.  In January 2000,  the patient suffered myocardial infarction and underwent placement of a  permanent pacemaker for bradycardia.  Since then, he has done well.   The patient underwent followup stress Cardiolite exam at the [facility] on 01/24/2002.  This test was notable for the  absence of any EKG or cineographic results suggestive of coronary ischemia.  The ejection fraction had decreased somewhat to 34%, and there was inferior  wall scar.  The patient exercised 5 minutes during the test, reaching a peak  heart rate of 127.  He stopped the test because of shortness of breath,  fatigue, and mild chest pain.  Because of the abnormal exam and worsening  ejection fraction, the patient elected to return for elective  catheterization after followup visit with Dr. Elsie Lincoln on 03/02/2002.   The patient  underwent left and right heart catheterization today by Dr.  Jenne Campus.  This demonstrates persistent, severe, native three-vessel coronary  artery disease as has been noted previously.  The left internal mammary  artery graft remains patent to the distal left anterior descending coronary  artery, and the saphenous vein graft to the first circumflex marginal branch  remains patent.  The saphenous vein graft to the distal right coronary  artery remains chronically occluded.  Left ventricular function is reduced  with ejection fraction estimated 25% with global hypokinesis and inferior  wall akinesis.  Cardiac surgical consultation has been requested.   REVIEW OF SYSTEMS:  GENERAL:  The patient reports feeling well.  He has good  appetite.  He had been reasonably active physically.  CARDIAC:  The patient  denies any episodes of chest pain other than the mild chest pain which  developed during his stress Cardiolite in November.  He has not had any  episodes of chest pain  since.  He reports that episode of pain was relieved  with nitroglycerin.  The patient does report exertional shortness of breath  which is mild in severity but has developed over the last few weeks.  He  denies any resting shortness of breath, PND, orthopnea, or lower extremity  edema.  He has not had any palpitations or syncopal episodes.  RESPIRATORY:  Essentially negative.  The patient denies productive cough.  GASTROINTESTINAL:  Negative.  The patient reports good appetite.  He states  that he has been cheating on his diet and eating greasy foods recently,  especially donuts and hash browns at [employer].  The patient denies any  history of hematochezia, hematemesis, or melena.  NEUROLOGIC:  Negative.  PERIPHERAL VASCULAR:  Negative.  ENDOCRINE:  Negative.  Overall, the patient  states he feels quite well and is uncertain why we are having this  conversation.   PAST MEDICAL HISTORY:  As stated previously and notable for history of  coronary artery disease, hypertension, hyperlipidemia.  There is no history  of diabetes.  The patient is a nonsmoker.  The patient has a history of  congenital deafness.  The patient underwent permanent pacemaker secondary to  bradycardia and A-V block in February 2000.   FAMILY HISTORY:  Notable for the absence of early-onset coronary artery  disease.   SOCIAL HISTORY:  The patient is married and lives with his wife.  Both he  and his wife are hearing impaired.  He is retired from the post office,  currently works part time at a gas station.  He is a nonsmoker and denies  history of excessive alcohol consumption.   MEDICATIONS PRIOR TO ADMISSION:  1. Isosorbide 30 mg once daily.  2. Zocor 20 mg once daily.  3. Atenolol 50 mg once daily.  4. Aspirin 325 mg once daily.  5. Altace 5 mg once daily.   ALLERGIES:  The patient denies any known drug allergies or sensitivities.   PHYSICAL EXAMINATION:  GENERAL:  Well-appearing male who appears his  stated age and in no acute distress.  VITAL SIGNS:  He is currently afebrile and normotensive and in normal sinus  rhythm.  HEENT:  Essentially unrevealing.  NECK:  Supple.  There is no cervical or supraclavicular lymphadenopathy.  There is no jugular venous distention, and no carotid bruits are noted.  CHEST:  Well-healed surgical scar from previous sternotomy.  The sternum is  stable on palpation.  Breath sounds are clear and symmetric bilaterally.  No  wheezes or rhonchi  noted.  CARDIOVASCULAR:  Regular rate and rhythm.  No murmurs, rubs, or gallops are  noted.  ABDOMEN:  Soft, nontender.  There are no palpable masses.  Bowel sounds are  present.  EXTREMITIES:  Warm and well perfused.  There is no lower extremity edema.  There is a well-healed surgical scar from saphenous vein harvest in the  past.  There is no venous insufficiency.  There is no lower extremity edema.  Distal pulses are palpable in both lower legs at the ankle.  RECTAL/GU:  Both exams deferred.  NEUROLOGIC:  Grossly nonfocal and symmetric throughout.   DIAGNOSTIC TESTS:  Cardiac catheterization performed today by Dr. Jenne Campus is  reviewed.  This demonstrates severe native coronary artery disease including  chronic obstruction of the left anterior descending coronary artery just  after the takeoff of the first diagonal branch.  There was 95% proximal  stenosis of the first diagonal branch.  There is chronic occlusion of the  left circumflex coronary artery and the proximal right coronary artery.  The  saphenous vein graft to the distal right coronary artery is chronically  occluded.  The saphenous vein graft to the first circumflex marginal branch  remains widely patent with a mild 20 to 30% proximal stenosis and no  significant luminal irregularity.  This vessel perfuses the second  circumflex marginal branch via backflow without significant luminal  obstruction.  The left internal mammary artery remains widely  patent to the  distal left anterior descending coronary artery.  On one view, there does  appear to be mild narrowing at the distal anastomosis, but this is of  unclear significance and only noticeable on one projection.  On all  remaining views, the anastomosis appears widely patent.  The distal left  anterior descending coronary artery is diffusely diseased.   IMPRESSION:  Severe native three-vessel coronary artery disease with  moderate left ventricular dysfunction and abnormal stress Cardiolite exam.  The patient has chronic occlusion of the saphenous vein graft to the distal  right coronary artery.  The saphenous vein graft to the circumflex marginal  branch and left internal mammary artery to the distal left anterior  descending coronary artery both remain patent.  There is no question that  the patient's degree of left ventricular dysfunction has progressed  somewhat.  However, the patient does not seem to be having significant symptoms of angina.  Under the circumstances, I am somewhat reluctant to  consider redo coronary artery bypass grafting.  Based upon coronary anatomy,  it is possible that the diagonal branch and the right coronary artery may  not be graftable.  In the absence of significant symptoms from angina, I am  reluctant to consider repeat operation for this patient's coronary anatomy.  There is certainly no survival benefit for surgery under these  circumstances.   RECOMMENDATIONS:  I recommend medical therapy.  I would certainly consider  the patient a candidate for elective re-do coronary artery bypass grafting  if he were having symptoms of angina despite medical therapy.  At present,  the patient claims that he feels fine.  Given this, I would favor holding  off on proceeding with repeat surgical revascularization.  I would be  delighted to see him back again for repeat consultation should he suffer  from worsening symptoms despite medical therapy.  Salvatore Decent. Cornelius Moras, M.D.    CHO/MEDQ  D:  03/24/2002  T:  03/25/2002  Job:  [id number]   cc:   Madaline Savage, M.D.  [address]  [place]  [place] [address]  Fax: [phone]   Darlin Priestly, M.D.  [phone] [address]  [place]  [place] [address]  Fax: [phone]   Jerl Mina, M.D.

## 2010-08-01 NOTE — Op Note (Signed)
NAMEGREYSON, Thomas [name]                ACCOUNT NO.:  [id number]   MEDICAL RECORD NO.:  [id number]          PATIENT TYPE:  OIB   LOCATION:  2899                         FACILITY:  [facility]   PHYSICIAN:  Darlin Priestly, MD  DATE OF BIRTH:  April 12, 1934   DATE OF PROCEDURE:  03/24/2005  DATE OF DISCHARGE:                                 OPERATIVE REPORT   PROCEDURE:  1.  Explantation of a Guidant Medtronic pacer, model number V4273791, serial      number [id number].  2.  Lead interrogation.  3.  Pocket revision.  4.  Implantation of a Medtronic Adapta ADDRO1, serial number [id number].   ATTENDING PHYSICIAN:  Darlin Priestly, M.D.   COMPLICATIONS:  None.   INDICATIONS FOR PROCEDURE:  Mr. Ghosh is a 76 year old male patient of Dr.  Burnett Sheng and Dr. Chanda Busing with a history of CAD status post bypass  surgery in 1986 with subsequent intermittent complete heart block and  subsequent dual chamber pacer implantation in February 2002 using a Guidant  Discovery pacer, model number 1274.  It is now approaching end of life.  He  is now brought for elective generator change out.   DESCRIPTION OF PROCEDURE:  After giving informed written consent, the  patient was brought to the cardiac cath lab were the left chest were shaved,  prepped and draped in a sterile fashion.  ECG monitoring was established.  1% lidocaine was used to anesthetize over the previously implanted  generator.  Next, an approximately 3 cm horizontal incision was carried out  over the previously placed generator and blunt dissection was used to carry  this down to the capsule.  Hemostasis was obtained with electrocautery.  The  capsule was entered with a scalpel and easily freed from the generator.  The  generator was then delivered from the pocket.  The leads were then inspected  and appeared to be in good shape.  The atrial lead was a Guidant model  number [id number], serial number [id number], the ventricular lead was model number  4285,  serial number [id number].  The generator, as previously stated, was a  Architect pacemaker, model number V4273791, serial number [id number].  The  head screws were loosened and the leads were disconnected from the  generator.  Both leads were interrogated.  The ventricular lead revealed an  R wave of 17 millivolts.  The threshold in the ventricle was 1.1 volts at  0.5 milliseconds.  Impedance was 565 ohms.  Current 2.2 milliamps.  The  atrial lead reveals P wave of 15.9 millivolts.  The threshold in the atrium  was 0.9 volts at 0.5 milliseconds.  Impedance 493 ohms.  Current 2.2  milliamps.  The leads were then connected in serial fashion to a Medtronic  Adapta, model number ADDRO1, serial number [id number], head screws were  tightened, and pacing was confirmed.  The pocket was then copiously  irrigated with 1% Kanamycin solution.  Hemostasis was confirmed.  The  generator and leads were then delivered back into the pocket and, again,  hemostasis was confirmed.  The subcutaneous layers were closed  using running  2-0 Vicryl.  The skin layers were then closed using running 4-0 Vicryl.  We  had to apply 1% lidocaine with epinephrine to the subcutaneous tissue for  slow persistent oozing which appeared to be resolve.  Steri-Strips were then  applied.  The patient returned to the recovery room in stable condition.   CONCLUSION:  1.  Successful explantation of a Guidant Discovery generator, model number      [id number], serial number [id number].  2.  Lead interrogation.  3.  Pocket revision.  4.  Implantation of a Medtronic Adapta ADDRO1, serial number [id number].      Darlin Priestly, MD  Electronically Signed     RHM/MEDQ  D:  03/24/2005  T:  03/24/2005  Job:  [id number]   cc:   Dr. Delma Post, M.D.  Fax: [phone]

## 2010-11-13 ENCOUNTER — Ambulatory Visit: Payer: Self-pay | Admitting: Gastroenterology

## 2010-12-03 LAB — POCT CARDIAC MARKERS
CKMB, poc: 2.5
Myoglobin, poc: 93.6
Myoglobin, poc: 93.8
Operator id: 272551
Operator id: 282201

## 2010-12-03 LAB — I-STAT 8, (EC8 V) (CONVERTED LAB)
BUN: 30 — ABNORMAL HIGH
Bicarbonate: 22.1
Chloride: 107
Glucose, Bld: 138 — ABNORMAL HIGH
HCT: 29 — ABNORMAL LOW
pCO2, Ven: 36.9 — ABNORMAL LOW
pH, Ven: 7.384 — ABNORMAL HIGH

## 2010-12-17 LAB — BASIC METABOLIC PANEL
CO2: 23
Calcium: 9.3
Chloride: 105
Creatinine, Ser: 1.42
GFR calc Af Amer: 60 — ABNORMAL LOW
Glucose, Bld: 105 — ABNORMAL HIGH

## 2010-12-17 LAB — CBC
Hemoglobin: 13.9
MCHC: 33.9
MCV: 89.1
RBC: 4.59
RDW: 15

## 2010-12-17 LAB — HEPARIN LEVEL (UNFRACTIONATED): Heparin Unfractionated: <0.1 — ABNORMAL LOW

## 2011-03-17 DIAGNOSIS — Z87438 Personal history of other diseases of male genital organs: Secondary | ICD-10-CM

## 2011-03-17 HISTORY — DX: Personal history of other diseases of male genital organs: Z87.438

## 2011-07-11 ENCOUNTER — Emergency Department: Payer: Self-pay | Admitting: *Deleted

## 2011-07-11 LAB — CBC WITH DIFFERENTIAL/PLATELET
Basophil #: 0.1 10*3/uL (ref 0.0–0.1)
Basophil %: 0.5 %
Lymphocyte #: 1.1 10*3/uL (ref 1.0–3.6)
Lymphocyte %: 8.5 %
MCH: 30.8 pg (ref 26.0–34.0)
MCHC: 33.1 g/dL (ref 32.0–36.0)
MCV: 93 fL (ref 80–100)
Monocyte #: 1.3 x10 3/mm — ABNORMAL HIGH (ref 0.2–1.0)
Monocyte %: 10.5 %
Neutrophil #: 10.3 10*3/uL — ABNORMAL HIGH (ref 1.4–6.5)
Platelet: 236 10*3/uL (ref 150–440)

## 2011-07-11 LAB — BASIC METABOLIC PANEL
Anion Gap: 7 (ref 7–16)
BUN: 31 mg/dL — ABNORMAL HIGH (ref 7–18)
Chloride: 105 mmol/L (ref 98–107)
EGFR (African American): 55 — ABNORMAL LOW
Glucose: 118 mg/dL — ABNORMAL HIGH (ref 65–99)
Osmolality: 287 (ref 275–301)
Potassium: 4.3 mmol/L (ref 3.5–5.1)
Sodium: 140 mmol/L (ref 136–145)

## 2011-07-11 LAB — PROTIME-INR: Prothrombin Time: 23 secs — ABNORMAL HIGH (ref 11.5–14.7)

## 2011-07-14 LAB — COMPREHENSIVE METABOLIC PANEL
Albumin: 3.6 g/dL (ref 3.4–5.0)
Alkaline Phosphatase: 108 U/L (ref 50–136)
BUN: 31 mg/dL — ABNORMAL HIGH (ref 7–18)
Bilirubin,Total: 0.7 mg/dL (ref 0.2–1.0)
Calcium, Total: 9.5 mg/dL (ref 8.5–10.1)
Co2: 24 mmol/L (ref 21–32)
Creatinine: 1.77 mg/dL — ABNORMAL HIGH (ref 0.60–1.30)
Glucose: 102 mg/dL — ABNORMAL HIGH (ref 65–99)
Potassium: 4.2 mmol/L (ref 3.5–5.1)
SGOT(AST): 30 U/L (ref 15–37)
SGPT (ALT): 36 U/L
Total Protein: 8.5 g/dL — ABNORMAL HIGH (ref 6.4–8.2)

## 2011-07-14 LAB — PROTIME-INR
INR: 2.1
Prothrombin Time: 23.9 secs — ABNORMAL HIGH (ref 11.5–14.7)

## 2011-07-14 LAB — URINALYSIS, COMPLETE
Bilirubin,UR: NEGATIVE
Ph: 5 (ref 4.5–8.0)
Protein: 100
Specific Gravity: 1.025 (ref 1.003–1.030)

## 2011-07-14 LAB — CBC
HCT: 39.9 % — ABNORMAL LOW (ref 40.0–52.0)
MCH: 31.4 pg (ref 26.0–34.0)
MCHC: 34.1 g/dL (ref 32.0–36.0)
MCV: 92 fL (ref 80–100)
WBC: 14.8 10*3/uL — ABNORMAL HIGH (ref 3.8–10.6)

## 2011-07-15 ENCOUNTER — Inpatient Hospital Stay: Payer: Self-pay | Admitting: Specialist

## 2011-07-15 LAB — TROPONIN I
Troponin-I: 0.06 ng/mL — ABNORMAL HIGH
Troponin-I: 0.05 ng/mL

## 2011-07-16 LAB — COMPREHENSIVE METABOLIC PANEL
Albumin: 2.5 g/dL — ABNORMAL LOW (ref 3.4–5.0)
Alkaline Phosphatase: 86 U/L (ref 50–136)
Anion Gap: 9 (ref 7–16)
BUN: 26 mg/dL — ABNORMAL HIGH (ref 7–18)
Bilirubin,Total: 0.9 mg/dL (ref 0.2–1.0)
Chloride: 109 mmol/L — ABNORMAL HIGH (ref 98–107)
Co2: 21 mmol/L (ref 21–32)
Creatinine: 1.44 mg/dL — ABNORMAL HIGH (ref 0.60–1.30)
EGFR (Non-African Amer.): 47 — ABNORMAL LOW
Osmolality: 283 (ref 275–301)
SGPT (ALT): 26 U/L
Total Protein: 6.7 g/dL (ref 6.4–8.2)

## 2011-07-16 LAB — CBC WITH DIFFERENTIAL/PLATELET
Basophil #: 0.1 10*3/uL (ref 0.0–0.1)
Eosinophil #: 0 10*3/uL (ref 0.0–0.7)
HCT: 34.8 % — ABNORMAL LOW (ref 40.0–52.0)
HGB: 11.4 g/dL — ABNORMAL LOW (ref 13.0–18.0)
Lymphocyte %: 11.4 %
MCHC: 32.8 g/dL (ref 32.0–36.0)
Monocyte #: 1.2 x10 3/mm — ABNORMAL HIGH (ref 0.2–1.0)
Monocyte %: 10 %
Neutrophil #: 9.5 10*3/uL — ABNORMAL HIGH (ref 1.4–6.5)
RDW: 13.9 % (ref 11.5–14.5)

## 2011-07-16 LAB — PROTIME-INR: Prothrombin Time: 28.1 secs — ABNORMAL HIGH (ref 11.5–14.7)

## 2011-07-16 LAB — MAGNESIUM: Magnesium: 2 mg/dL

## 2011-07-20 LAB — CULTURE, BLOOD (SINGLE)

## 2011-09-15 ENCOUNTER — Ambulatory Visit: Payer: Self-pay | Admitting: Urology

## 2011-11-19 ENCOUNTER — Inpatient Hospital Stay: Payer: Self-pay | Admitting: Internal Medicine

## 2011-11-19 LAB — CBC WITH DIFFERENTIAL/PLATELET
Basophil #: 0.1 10*3/uL (ref 0.0–0.1)
Basophil %: 0.7 %
Eosinophil #: 0.1 10*3/uL (ref 0.0–0.7)
Eosinophil %: 1.8 %
HCT: 43.4 % (ref 40.0–52.0)
HGB: 14.5 g/dL (ref 13.0–18.0)
Lymphocyte #: 1.5 10*3/uL (ref 1.0–3.6)
MCH: 30.9 pg (ref 26.0–34.0)
MCHC: 33.4 g/dL (ref 32.0–36.0)
MCV: 92 fL (ref 80–100)
Neutrophil #: 5.1 10*3/uL (ref 1.4–6.5)
Platelet: 177 10*3/uL (ref 150–440)
RBC: 4.7 10*6/uL (ref 4.40–5.90)

## 2011-11-19 LAB — URINALYSIS, COMPLETE
Bacteria: NONE SEEN
Bilirubin,UR: NEGATIVE
Glucose,UR: NEGATIVE mg/dL (ref 0–75)
Hyaline Cast: 4
Ketone: NEGATIVE
RBC,UR: 4 /HPF (ref 0–5)
Specific Gravity: 1.021 (ref 1.003–1.030)
Squamous Epithelial: <1
WBC UR: NONE SEEN /HPF (ref 0–5)

## 2011-11-19 LAB — BASIC METABOLIC PANEL
Calcium, Total: 9.8 mg/dL (ref 8.5–10.1)
Creatinine: 1.3 mg/dL (ref 0.60–1.30)
EGFR (African American): >60
EGFR (Non-African Amer.): 54 — ABNORMAL LOW
Glucose: 106 mg/dL — ABNORMAL HIGH (ref 65–99)
Potassium: 4.6 mmol/L (ref 3.5–5.1)
Sodium: 141 mmol/L (ref 136–145)

## 2011-11-19 LAB — PROTIME-INR
INR: 1.6
INR: 1.5
Prothrombin Time: 18.6 secs — ABNORMAL HIGH (ref 11.5–14.7)

## 2011-11-19 LAB — CK TOTAL AND CKMB (NOT AT ARMC): CK-MB: 3.8 ng/mL — ABNORMAL HIGH (ref 0.5–3.6)

## 2011-11-20 DIAGNOSIS — I509 Heart failure, unspecified: Secondary | ICD-10-CM

## 2011-11-20 DIAGNOSIS — I059 Rheumatic mitral valve disease, unspecified: Secondary | ICD-10-CM

## 2011-11-20 HISTORY — PX: OTHER SURGICAL HISTORY: SHX169

## 2011-11-20 LAB — CK TOTAL AND CKMB (NOT AT ARMC)
CK, Total: 137 U/L (ref 35–232)
CK, Total: 117 U/L (ref 35–232)
CK-MB: 2.6 ng/mL (ref 0.5–3.6)
CK-MB: 2.2 ng/mL (ref 0.5–3.6)

## 2011-11-20 LAB — BASIC METABOLIC PANEL
Anion Gap: 9 (ref 7–16)
BUN: 23 mg/dL — ABNORMAL HIGH (ref 7–18)
Calcium, Total: 9.4 mg/dL (ref 8.5–10.1)
EGFR (African American): >60
EGFR (Non-African Amer.): 60 — ABNORMAL LOW
Glucose: 100 mg/dL — ABNORMAL HIGH (ref 65–99)
Osmolality: 283 (ref 275–301)
Sodium: 140 mmol/L (ref 136–145)

## 2011-11-20 LAB — LIPID PANEL
Cholesterol: 139 mg/dL (ref 0–200)
HDL Cholesterol: 26 mg/dL — ABNORMAL LOW (ref 40–60)
Triglycerides: 108 mg/dL (ref 0–200)

## 2011-11-20 LAB — CBC WITH DIFFERENTIAL/PLATELET
Basophil #: 0 10*3/uL (ref 0.0–0.1)
Basophil %: 0.7 %
HCT: 41.5 % (ref 40.0–52.0)
HGB: 13.9 g/dL (ref 13.0–18.0)
Lymphocyte #: 1.4 10*3/uL (ref 1.0–3.6)
Lymphocyte %: 21.3 %
MCH: 30.8 pg (ref 26.0–34.0)
MCV: 92 fL (ref 80–100)
Monocyte %: 11.8 %
RBC: 4.51 10*6/uL (ref 4.40–5.90)
RDW: 14.4 % (ref 11.5–14.5)
WBC: 6.6 10*3/uL (ref 3.8–10.6)

## 2011-11-20 LAB — PROTIME-INR
INR: 1.5
Prothrombin Time: 18.5 secs — ABNORMAL HIGH (ref 11.5–14.7)

## 2011-11-20 LAB — TROPONIN I
Troponin-I: 0.06 ng/mL — ABNORMAL HIGH
Troponin-I: 0.05 ng/mL

## 2011-11-23 ENCOUNTER — Encounter: Payer: Self-pay | Admitting: *Deleted

## 2011-11-30 ENCOUNTER — Encounter: Payer: Self-pay | Admitting: Cardiovascular Disease

## 2011-11-30 ENCOUNTER — Other Ambulatory Visit: Payer: Self-pay | Admitting: Cardiovascular Disease

## 2011-12-01 ENCOUNTER — Ambulatory Visit
Admission: RE | Admit: 2011-12-01 | Discharge: 2011-12-01 | Disposition: A | Discharge status: Home / Self Care | Payer: Medicare Other | Source: Ambulatory Visit | Attending: Cardiovascular Disease | Admitting: Cardiovascular Disease

## 2011-12-01 ENCOUNTER — Other Ambulatory Visit: Payer: Self-pay | Admitting: Cardiovascular Disease

## 2011-12-01 DIAGNOSIS — R079 Chest pain, unspecified: Secondary | ICD-10-CM

## 2011-12-01 DIAGNOSIS — Z01811 Encounter for preprocedural respiratory examination: Secondary | ICD-10-CM

## 2011-12-10 ENCOUNTER — Encounter (HOSPITAL_COMMUNITY)
Admission: RE | Disposition: A | Discharge status: Home / Self Care | Payer: Self-pay | Source: Ambulatory Visit | Attending: Cardiovascular Disease

## 2011-12-10 ENCOUNTER — Encounter (HOSPITAL_COMMUNITY): Payer: Self-pay | Admitting: Pharmacy Technician

## 2011-12-10 ENCOUNTER — Ambulatory Visit (HOSPITAL_COMMUNITY)
Admission: RE | Admit: 2011-12-10 | Discharge: 2011-12-10 | Disposition: A | Discharge status: Home / Self Care | Payer: Medicare Other | Source: Ambulatory Visit | Attending: Cardiovascular Disease | Admitting: Cardiovascular Disease

## 2011-12-10 DIAGNOSIS — I428 Other cardiomyopathies: Secondary | ICD-10-CM | POA: Insufficient documentation

## 2011-12-10 DIAGNOSIS — I509 Heart failure, unspecified: Secondary | ICD-10-CM | POA: Insufficient documentation

## 2011-12-10 DIAGNOSIS — H913 Deaf nonspeaking, not elsewhere classified: Secondary | ICD-10-CM | POA: Insufficient documentation

## 2011-12-10 DIAGNOSIS — I2510 Atherosclerotic heart disease of native coronary artery without angina pectoris: Secondary | ICD-10-CM | POA: Insufficient documentation

## 2011-12-10 DIAGNOSIS — I25810 Atherosclerosis of coronary artery bypass graft(s) without angina pectoris: Secondary | ICD-10-CM

## 2011-12-10 DIAGNOSIS — Z951 Presence of aortocoronary bypass graft: Secondary | ICD-10-CM | POA: Insufficient documentation

## 2011-12-10 HISTORY — PX: CARDIAC CATHETERIZATION: SHX172

## 2011-12-10 HISTORY — PX: LEFT HEART CATHETERIZATION WITH CORONARY/GRAFT ANGIOGRAM: SHX5450

## 2011-12-10 LAB — PROTIME-INR: Prothrombin Time: 15.1 seconds (ref 11.6–15.2)

## 2011-12-10 SURGERY — LEFT HEART CATHETERIZATION WITH CORONARY/GRAFT ANGIOGRAM
Anesthesia: LOCAL

## 2011-12-10 MED ORDER — SODIUM CHLORIDE 0.9 % IV SOLN: 1.0000 mL/kg/h | INTRAVENOUS | Status: DC

## 2011-12-10 MED ORDER — MIDAZOLAM HCL 2 MG/2ML IJ SOLN
INTRAMUSCULAR | Status: AC
  Filled 2011-12-10: qty 2

## 2011-12-10 MED ORDER — WARFARIN SODIUM 7.5 MG PO TABS: 7.5000 mg | ORAL_TABLET | Freq: Every day | ORAL | Status: DC

## 2011-12-10 MED ORDER — CARVEDILOL 6.25 MG PO TABS: 12.5000 mg | ORAL_TABLET | Freq: Two times a day (BID) | ORAL | Status: DC

## 2011-12-10 MED ORDER — ACETAMINOPHEN 325 MG PO TABS: 650.0000 mg | ORAL_TABLET | ORAL | Status: DC | PRN

## 2011-12-10 MED ORDER — VERAPAMIL HCL 2.5 MG/ML IV SOLN
INTRAVENOUS | Status: AC
  Filled 2011-12-10: qty 2

## 2011-12-10 MED ORDER — ONDANSETRON HCL 4 MG/2ML IJ SOLN: 4.0000 mg | Freq: Four times a day (QID) | INTRAMUSCULAR | Status: DC | PRN

## 2011-12-10 MED ORDER — LIDOCAINE HCL (PF) 1 % IJ SOLN
INTRAMUSCULAR | Status: AC
  Filled 2011-12-10: qty 30

## 2011-12-10 MED ORDER — SODIUM CHLORIDE 0.9 % IV SOLN
1.0000 mL/kg/h | INTRAVENOUS | Status: DC
Start: 2011-12-10 — End: 2011-12-10

## 2011-12-10 MED ORDER — NITROGLYCERIN 0.2 MG/ML ON CALL CATH LAB
INTRAVENOUS | Status: AC
  Filled 2011-12-10: qty 1

## 2011-12-10 MED ORDER — SODIUM CHLORIDE 0.9 % IJ SOLN: 3.0000 mL | Freq: Two times a day (BID) | INTRAMUSCULAR | Status: DC

## 2011-12-10 MED ORDER — SODIUM CHLORIDE 0.9 % IV SOLN
INTRAVENOUS | Status: DC
  Administered 2011-12-10: 1000 mL via INTRAVENOUS

## 2011-12-10 MED ORDER — HEPARIN SODIUM (PORCINE) 1000 UNIT/ML IJ SOLN
INTRAMUSCULAR | Status: AC
  Filled 2011-12-10: qty 1

## 2011-12-10 MED ORDER — FENTANYL CITRATE 0.05 MG/ML IJ SOLN
INTRAMUSCULAR | Status: AC
  Filled 2011-12-10: qty 2

## 2011-12-10 NOTE — CV Procedure (Signed)
CARDIAC CATHETERIZATION REPORT   Procedures performed:  1. Left heart catheterization  2. Selective coronary angiography  3. Selective angiography of the saphenous vein graft to the oblique marginal artery 4. Selective angiography of the left internal mammary artery bypass to the left anterior descending artery  Reason for procedure:  Coronary artery disease status post previous coronary artery bypass surgery Congestive heart failure, recent acute exacerbation Moderate to severe ischemic cardiomyopathy with estimated left ventricular ejection fraction of 35%   Procedure performed by: Thurmon Fair, MD, [facility]  Complications: none   Estimated blood loss: less than 5 mL   History:  This 77 year old gentleman underwent coronary bypass surgery more than 25 years ago. She has had mildly depressed left ventricle systolic function due to an extensive scar in the inferior wall. Left ventricular ejection fraction has been estimated around 40-45%. He was recently hospitalized with an acute exacerbation of heart failure and reevaluation of left ventricular ejection fraction showed a reduction in EF. There was no evidence of acute myocardial necrosis by cardiac enzymes. Significant comorbidities include permanent atrial fibrillation with slow ventricular response status post implantation of a dual-chamber permanent pacemaker (now programmed VVI), extensive peripheral vascular disease status post stent graft repair of an abdominal aortic aneurysm and moderate chronic kidney disease with an estimated GFR of around 40 mL per minute.   Previous cardiac catheterization that showed an extremely tortuous left subclavian artery and cannulation of the left internal mammary artery was only possible from the left radial artery approach. During that same cardiac catheterization in 2009 it was noted that the saphenous vein graft bypass to the right coronary artery was totally occluded as was the native right coronary  artery.  Consent: The risks, benefits, and details of the procedure were explained to the patient. Risks including death, MI, stroke, bleeding, limb ischemia, renal failure and allergy were described and accepted by the patient. Informed written consent was obtained prior to proceeding.  Technique: The patient was brought to the cardiac catheterization laboratory in the fasting state. He was prepped and draped in the usual sterile fashion. Local anesthesia with 1% lidocaine was administered to the left wristarea. Using the modified Seldinger technique a 5 French left radial  Artery glide sheath was introduced without difficulty. Under fluoroscopic guidance, using 5 [place]  JR and IM catheters, selective cannulation of the left internal mammary artery was attempted. Subselective cannulation was successfully achieved only with great difficulty even from the left radial approach. Subsequently a JR catheter was passed over wire to the ascending aorta without difficulty, but manipulation of the catheter  was extremely difficult due to the tortuous nature of the left subclavian artery. The radial approach was subsequently abandoned and the remainder of the catheterization is performed from the right femoral approach. A TR hemostasis band was placed on the radial artery.  Using the modified Seldinger technique a 5 French right common femoral artery was placed without much difficulty. Some resistance was encountered due to extensive scar tissue in the right groin. Great care was taken advancing the guidewire catheters across the abdominal aortic aneurysm stent graft. A JR catheter was initially passed the subsequent exchanges were performed over a long J-tipped guidewire. The JR catheter was used to selectively cannulate the native coronary artery there was ostially occluded with only a tiny infundibular branch present. The same catheter was used to cannulate the stump of the saphenous vein graft right coronary artery  which was also occluded and was then used to perform selective angiography  of the saphenous vein graft to the oblique marginal artery. The JR catheter also easily passed across the aortic valve the misuse to record left ventricular pressures and a pullback to the aorta. The left ventricular gram was not performed due to renal insufficiency. A JL 4 catheter was then used to cannulate the left main coronary artery and perform several angiograms of this vessel. lright coronary artery and left ventricle were respectively performed. After the procedure, femoral artery hemostasis will be achieved with manual pressure.  Contrast used: 220 mL Omnipaque  Angiographic Findings:  1. The left main coronary artery is free of significant atherosclerosis and is a very large vessel thatbifurcates in the usual fashion into the left anterior descending artery and left circumflex coronary artery.  2. The left anterior descending artery is severely diseased in its proximal portion, moderately calcified and is totally occluded following the first diagonal and first septal arteries. The distal LAD artery fills via the left internal mammary artery bypass graft. Sequential 70% to 95% stenoses are seen in the LAD and the ostium of the first diagonal artery. The diagonal artery is a moderate-sized vessel. There is some aneurysmal poststenotic dilatation in the diagonal artery. 3. The left circumflex coronary artery is a large-size vessel non- dominant vessel that generates one major oblique marginal artery, after which it is totally occluded. There is evidence of extensive luminal irregularities and moderate to severe calcification. At least 3 sequential hemodynamically meaningful stenoses are seen in the trunk of the left circumflex coronary artery and first oblique marginal artery, varying in severity between 80 and 95%. 4. The right coronary artery is totally occluded at the ostium. During injection of the LIMA bypass, mediocre  collaterals from the distal LAD artery are seen filling the posterior descending artery.  5. The left ventricle was not injected There is no aortic valve stenosis by pullback. The left ventricular end-diastolic pressure is 19 mm Hg. 6. The saphenous vein graft bypass the oblique marginal artery is a widely patent large and healthy-appearing conduit. It fills a large branching second oblique marginal vessel. There is retrograde flow to a small and diffusely diseased third oblique marginal artery, with TIMI 2 flow. This latter vessel was healthier and had better-flow at the previous cardiac catheterization was only moderate in size  7. The left internal mammary artery bypass to the LAD artery was difficult to selectively cannulate. It is widely patent and free of significant stenoses for a most of its course. It is a very tortuous vessel. The anastomotic site of the LAD artery was not well visualized but there is prompt contrast filling the entire length of the distal LAD artery. No obvious severe stenoses are seen   IMPRESSIONS:  Severe native and graft coronary artery disease. There are very limited options for revascularization. The only possible target is the first margo branch of the LAD artery but this would be a challenging intervention and unlikely to have a very significant impact on overall left ventricular performance RECOMMENDATION:  We'll review the angiograms with interventional cardiology. Suspect medical management will be the best option. Restart warfarin in 48 hours. Recheck renal function in a week. Followup in the clinic in 2 weeks. May have to discuss ICD therapy.

## 2011-12-10 NOTE — H&P (Signed)
Date of Initial H&P: 11/30/2011  History reviewed, patient examined, no change in status, stable for surgery. Extensive CAD history, > 20 years since CABG, now with worsening cardiomyopathy and recent acute HF exacerbation. Here for diagnostic cath and possible PCI. He is congenitally deaf-mute. Sign language translator services have been used. This procedure has been fully reviewed with the patient and written informed consent has been obtained. Thurmon Fair, MD, [facility] [phone] office [phone] pager 12/10/2011 8:07 AM

## 2011-12-10 NOTE — Progress Notes (Signed)
Pt walked in hallway without difficulty, right groin site remains stable. 

## 2011-12-17 ENCOUNTER — Encounter: Payer: Self-pay | Admitting: Cardiovascular Disease

## 2012-05-03 ENCOUNTER — Other Ambulatory Visit (HOSPITAL_COMMUNITY): Payer: Self-pay | Admitting: Cardiovascular Disease

## 2012-05-03 DIAGNOSIS — I2510 Atherosclerotic heart disease of native coronary artery without angina pectoris: Secondary | ICD-10-CM

## 2012-05-03 DIAGNOSIS — R0602 Shortness of breath: Secondary | ICD-10-CM

## 2012-05-03 DIAGNOSIS — I509 Heart failure, unspecified: Secondary | ICD-10-CM

## 2012-05-16 ENCOUNTER — Ambulatory Visit (HOSPITAL_COMMUNITY): Payer: Medicare Other

## 2012-08-03 ENCOUNTER — Other Ambulatory Visit (HOSPITAL_COMMUNITY): Payer: Self-pay | Admitting: Cardiovascular Disease

## 2012-08-05 ENCOUNTER — Other Ambulatory Visit: Payer: Self-pay | Admitting: *Deleted

## 2012-08-05 MED ORDER — ISOSORBIDE MONONITRATE ER 60 MG PO TB24: ORAL_TABLET | ORAL | Status: DC

## 2012-08-05 NOTE — Telephone Encounter (Signed)
rx refill for isosorbide

## 2012-09-06 ENCOUNTER — Other Ambulatory Visit: Payer: Self-pay | Admitting: *Deleted

## 2012-09-06 MED ORDER — SIMVASTATIN 20 MG PO TABS: 20.0000 mg | ORAL_TABLET | Freq: Every day | ORAL | Status: DC

## 2012-09-06 NOTE — Telephone Encounter (Signed)
Simvastatin refilled electronically

## 2012-09-12 ENCOUNTER — Emergency Department: Payer: Self-pay | Admitting: Unknown Physician Specialty

## 2012-09-13 LAB — COMPREHENSIVE METABOLIC PANEL
Albumin: 4 g/dL (ref 3.4–5.0)
Alkaline Phosphatase: 82 U/L (ref 50–136)
Bilirubin,Total: 1 mg/dL (ref 0.2–1.0)
Calcium, Total: 9.1 mg/dL (ref 8.5–10.1)
Chloride: 111 mmol/L — ABNORMAL HIGH (ref 98–107)
Co2: 25 mmol/L (ref 21–32)
EGFR (African American): 59 — ABNORMAL LOW
Glucose: 101 mg/dL — ABNORMAL HIGH (ref 65–99)
SGOT(AST): 24 U/L (ref 15–37)
SGPT (ALT): 25 U/L (ref 12–78)
Sodium: 143 mmol/L (ref 136–145)
Total Protein: 7.5 g/dL (ref 6.4–8.2)

## 2012-09-13 LAB — CBC
HGB: 14.3 g/dL (ref 13.0–18.0)
MCH: 31.8 pg (ref 26.0–34.0)
MCHC: 34.9 g/dL (ref 32.0–36.0)
Platelet: 170 10*3/uL (ref 150–440)
RBC: 4.5 10*6/uL (ref 4.40–5.90)
RDW: 14.9 % — ABNORMAL HIGH (ref 11.5–14.5)
WBC: 11.7 10*3/uL — ABNORMAL HIGH (ref 3.8–10.6)

## 2012-09-14 ENCOUNTER — Emergency Department: Payer: Self-pay | Admitting: Emergency Medicine

## 2012-09-14 LAB — CBC WITH DIFFERENTIAL/PLATELET
Basophil #: 0.1 10*3/uL (ref 0.0–0.1)
Eosinophil #: 0.2 10*3/uL (ref 0.0–0.7)
Eosinophil %: 2.1 %
HCT: 39.6 % — ABNORMAL LOW (ref 40.0–52.0)
HGB: 13.5 g/dL (ref 13.0–18.0)
MCH: 30.9 pg (ref 26.0–34.0)
MCHC: 34 g/dL (ref 32.0–36.0)
MCV: 91 fL (ref 80–100)
Monocyte %: 12.3 %
Neutrophil #: 7.4 10*3/uL — ABNORMAL HIGH (ref 1.4–6.5)
Platelet: 185 10*3/uL (ref 150–440)
RBC: 4.36 10*6/uL — ABNORMAL LOW (ref 4.40–5.90)

## 2012-09-14 LAB — COMPREHENSIVE METABOLIC PANEL
Albumin: 3.7 g/dL (ref 3.4–5.0)
Alkaline Phosphatase: 87 U/L (ref 50–136)
Anion Gap: 8 (ref 7–16)
Bilirubin,Total: 1.1 mg/dL — ABNORMAL HIGH (ref 0.2–1.0)
Chloride: 105 mmol/L (ref 98–107)
EGFR (African American): 47 — ABNORMAL LOW
Osmolality: 288 (ref 275–301)
Potassium: 3.9 mmol/L (ref 3.5–5.1)
SGPT (ALT): 26 U/L (ref 12–78)

## 2012-09-14 LAB — TROPONIN I: Troponin-I: 0.04 ng/mL

## 2012-11-11 ENCOUNTER — Encounter: Payer: Self-pay | Admitting: Physician Assistant

## 2012-11-11 DIAGNOSIS — Z951 Presence of aortocoronary bypass graft: Secondary | ICD-10-CM

## 2012-11-11 DIAGNOSIS — Z950 Presence of cardiac pacemaker: Secondary | ICD-10-CM

## 2012-11-11 DIAGNOSIS — I255 Ischemic cardiomyopathy: Secondary | ICD-10-CM

## 2012-11-11 DIAGNOSIS — Z95810 Presence of automatic (implantable) cardiac defibrillator: Secondary | ICD-10-CM | POA: Insufficient documentation

## 2012-11-11 DIAGNOSIS — I5022 Chronic systolic (congestive) heart failure: Secondary | ICD-10-CM

## 2012-11-11 DIAGNOSIS — I4821 Permanent atrial fibrillation: Secondary | ICD-10-CM

## 2012-11-11 DIAGNOSIS — I2510 Atherosclerotic heart disease of native coronary artery without angina pectoris: Secondary | ICD-10-CM | POA: Insufficient documentation

## 2012-11-15 ENCOUNTER — Encounter: Payer: Self-pay | Admitting: Cardiovascular Disease

## 2012-11-15 ENCOUNTER — Ambulatory Visit (INDEPENDENT_AMBULATORY_CARE_PROVIDER_SITE_OTHER): Payer: Medicare Other | Admitting: Cardiovascular Disease

## 2012-11-15 VITALS — BP 120/72 | HR 75 | Resp 16 | Ht 74.0 in | Wt 235.5 lb

## 2012-11-15 DIAGNOSIS — I5022 Chronic systolic (congestive) heart failure: Secondary | ICD-10-CM

## 2012-11-15 DIAGNOSIS — I4821 Permanent atrial fibrillation: Secondary | ICD-10-CM

## 2012-11-15 DIAGNOSIS — Z950 Presence of cardiac pacemaker: Secondary | ICD-10-CM

## 2012-11-15 DIAGNOSIS — I2510 Atherosclerotic heart disease of native coronary artery without angina pectoris: Secondary | ICD-10-CM

## 2012-11-15 DIAGNOSIS — I4891 Unspecified atrial fibrillation: Secondary | ICD-10-CM

## 2012-11-15 DIAGNOSIS — I255 Ischemic cardiomyopathy: Secondary | ICD-10-CM

## 2012-11-15 DIAGNOSIS — I2589 Other forms of chronic ischemic heart disease: Secondary | ICD-10-CM

## 2012-11-15 LAB — PACEMAKER DEVICE OBSERVATION

## 2012-11-15 NOTE — Patient Instructions (Addendum)
Your physician has requested that you have an echocardiogram. Echocardiography is a painless test that uses sound waves to create images of your heart. It provides your doctor with information about the size and shape of your heart and how well your heart's chambers and valves are working. This procedure takes approximately one hour. There are no restrictions for this procedure.  Your physician recommends that you schedule a follow-up appointment in:  3 months  Start Fish oil 1200mg  once a day.

## 2012-11-16 ENCOUNTER — Encounter: Payer: Self-pay | Admitting: Cardiovascular Disease

## 2012-11-16 NOTE — Assessment & Plan Note (Signed)
He has moderate to severe ischemic cardiomyopathy. Whether or not to receive a defibrillator will depend on repeat assessment of left ventricular ejection fraction. CRT P would provide quality of life improvement. It is up to him whether he would also desire defibrillator therapy, since he is 78 years old. It should be pointed out that until recently he was working full-time. Once we have the echo, will refer her for EP evaluation and discussion of these options.

## 2012-11-16 NOTE — Assessment & Plan Note (Signed)
,   But continues to have NYHA functional class 2-3 dyspnea on exertion. He is on maximum tolerated doses of beta blocker and angiotensin receptor blocker. Attempts to increase the dose of an associated with hypotension and dizziness. He is compliant with sodium restriction. Repeat an echocardiogram. If left ventricular ejection fraction remains moderately depressed, refer for evaluation for cardiac resynchronization therapy. He has atrial fibrillation with slow ventricular response and essentially 100% ventricular pacing. The paced QRS duration is 202 ms.

## 2012-11-16 NOTE — Assessment & Plan Note (Addendum)
He has very slow ventricular response and has essentially 100% ventricular pacing. This compounds the problems of his ischemic cardiomyopathy. He is on appropriate anticoagulation therapy. Has not had a stroke or other embolic events to my knowledge. No recent significant bleeding problems (he did have some transient problems with hematuria and lower GI bleeding when he had excessive hypoprothrombinemia earlier this year)

## 2012-11-16 NOTE — Assessment & Plan Note (Signed)
Normal device function. His dual-chamber device is now functioning in VVIR mode Very rare native ventricular beats. Greater than 99% % ventricular pacing

## 2012-11-16 NOTE — Assessment & Plan Note (Signed)
Repeat cardiac catheterization roughly one year ago shows severe native coronary artery disease as well as only 2 patent bypass grafts. The LAD artery has a 70% stenosis at the ostium of the first diagonal artery, which does not have a bypass graft. The distal LAD fills via a mammary artery bypass. The circumflex coronary artery is diffusely and severely diseased. There is a patent bypass to one of the 3 oblique marginal grafts. Right coronary artery is totally occluded and does not have a patent bypass. There are no good options for revascularization. It would be probably possible to perform angioplasty of the first diagonal artery branch of the LAD but this is unlikely to have a meaningful impact on overall left ventricular systolic function. He develops angina occasionally when he becomes very upset, but this symptom is much less prominent than the exertional dyspnea.

## 2012-11-16 NOTE — Progress Notes (Signed)
Patient ID: Thomas [name], male   DOB: 01/02/1935, 78 y.o.   MRN: [id number]     Reason for office visit Congestive heart failure, atrial fibrillation, coronary artery disease, complete heart block  Thomas [name] is now 78 years old and has severe CAD and advanced ischemic cardiomyopathy as well as permanent atrial relation with high-grade AV block/complete heart block. Over the last year or so he has had worsening problems with congestive heart failure and left ventricular systolic function has deteriorated. Most recently LVEF was estimated to be 30-35%. His heart failure medications have been increased to the maximum tolerated dose. He continues to have symptoms of heart failure. He cannot complete a full flight of steps without stopping to catch his breath. He develops shortness of breath and chest pain when becoming emotionally upset. 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    No Known Allergies  Current Outpatient Prescriptions  Medication Sig Dispense Refill  . carvedilol (COREG) 6.25 MG tablet Take 2 tablets (12.5 mg total) by mouth 2 (two) times daily with a meal.  60 tablet  11  . famotidine (PEPCID) 20 MG tablet Take 20 mg by mouth daily.      . furosemide (LASIX) 40 MG tablet Take 40 mg by mouth daily.      . isosorbide mononitrate (IMDUR) 60 MG 24 hr tablet Take 1 and 1/2 tablets daily  135 tablet  1  . losartan (COZAAR) 50 MG tablet Take 50 mg by mouth daily.      . nitroGLYCERIN (NITROSTAT) 0.4 MG SL tablet Place 0.4 mg under the tongue every 5 (five) minutes as needed. For chest pain.      . Omega-3 Fatty Acids (FISH OIL PO) Take 1,200 mg by mouth daily.      . simvastatin (ZOCOR) 20 MG tablet Take 1 tablet (20 mg total) by mouth daily.  30 tablet  5  . warfarin (COUMADIN) 5 MG tablet Take 5 mg by mouth daily. Daily or as directed       No current facility-administered medications for this visit.    Past Medical History  Diagnosis Date  . Coronary artery disease     . Chronic atrial fibrillation   . History of bleeding peptic ulcer 1980  . History of abdominal aortic aneurysm   . History of epididymitis 2013  . Testicular swelling   . Hydronephrosis with ureteropelvic junction obstruction   . Hydroureter on left 2009  . Status post coronary artery bypass grafting 1986    LIMA to the LAD, SVG to OM, SVG to RCA  . Presence of permanent cardiac pacemaker 03/24/2005    Implantation of a Medtronic Adapta ADDRO1, serial number [id number]  . Ischemic cardiomyopathy     EF 30-35%.    Past Surgical History  Procedure Laterality Date  . Coronary artery bypass graft  1986  . Insert / replace / remove pacemaker    . Transurethral resection of prostate      s/p  . Cardiac catheterization  12/10/2011    SVG to OM widely patent.  LIMA to LAD patent  . 2-d echocardiogram  11/20/2011    Ejection fraction 30-35% moderate concentric left ventricular hypertrophy. Left atrium is moderately dilated. Mild MR. Mild or  . Persantine myoview  05/06/2010    Post-rest ejection fraction 30%. No significant ischemia demonstrated. Compared to previous study there is no significant change.    No family history on file.  History   Social History  .  Marital Status: Married    Spouse Name: N/A    Number of Children: N/A  . Years of Education: N/A   Occupational History  . Not on file.   Social History Main Topics  . Smoking status: Former Smoker    Quit date: 03/15/1985  . Smokeless tobacco: Not on file  . Alcohol Use: Yes     Comment: occas.  . Drug Use: No  . Sexual Activity: Not on file   Other Topics Concern  . Not on file   Social History Narrative  . No narrative on file    Review of systems: The patient specifically denies any chest pain at rest, dyspnea at rest, orthopnea, paroxysmal nocturnal dyspnea, syncope, palpitations, focal neurological deficits, intermittent claudication, lower extremity edema, unexplained weight gain, cough, hemoptysis  or wheezing.  The patient also denies abdominal pain, nausea, vomiting, dysphagia, diarrhea, constipation, polyuria, polydipsia, dysuria, hematuria, frequency, urgency, abnormal bleeding or bruising, fever, chills, unexpected weight changes, mood swings, change in skin or hair texture, change in voice quality, auditory or visual problems, allergic reactions or rashes, new musculoskeletal complaints other than usual "aches and pains".   PHYSICAL EXAM BP 120/72  Pulse 75  Resp 16  Ht 6\' 2"  (1.88 m)  Wt 235 lb 8 oz (106.822 kg)  BMI 30.22 kg/m2  General: Alert, oriented x3, no distress Head: no evidence of trauma, PERRL, EOMI, no exophtalmos or lid lag, no myxedema, no xanthelasma; normal ears, nose and oropharynx Neck: normal jugular venous pulsations and no hepatojugular reflux; brisk carotid pulses without delay and no carotid bruits Chest: clear to auscultation, no signs of consolidation by percussion or palpation, normal fremitus, symmetrical and full respiratory excursions Cardiovascular: normal position and quality of the apical impulse, regular rhythm, normal first and paradoxically split second heart sounds, no rubs or gallops, rate 2-3/6 systolic ejection murmur heard in the aortic focus in the left lower sternal border Abdomen: no tenderness or distention, no masses by palpation, no abnormal pulsatility or arterial bruits, normal bowel sounds, no hepatosplenomegaly Extremities: no clubbing, cyanosis or edema; 2+ radial, ulnar and brachial pulses bilaterally; 2+ right femoral, posterior tibial and dorsalis pedis pulses; 2+ left femoral, posterior tibial and dorsalis pedis pulses; no subclavian or femoral bruits Neurological: grossly nonfocal   EKG: Atrial fibrillation, 100% ventricular pacing   ASSESSMENT AND PLAN Chronic systolic heart failure , But continues to have NYHA functional class 2-3 dyspnea on exertion. He is on maximum tolerated doses of beta blocker and angiotensin  receptor blocker. Attempts to increase the dose of an associated with hypotension and dizziness. He is compliant with sodium restriction. Repeat an echocardiogram. If left ventricular ejection fraction remains moderately depressed, refer for evaluation for cardiac resynchronization therapy. He has atrial fibrillation with slow ventricular response and essentially 100% ventricular pacing. The paced QRS duration is 202 ms.  Cardiomyopathy, ischemic: Ejection fraction 30-35% He has moderate to severe ischemic cardiomyopathy. Whether or not to receive a defibrillator will depend on repeat assessment of left ventricular ejection fraction. CRT P would provide quality of life improvement. It is up to him whether he would also desire defibrillator therapy, since he is 78 years old. It should be pointed out that until recently he was working full-time. Once we have the echo, will refer her for EP evaluation and discussion of these options.  CAD Status post coronary artery bypass grafting: 1995 Repeat cardiac catheterization roughly one year ago shows severe native coronary artery disease as well as only 2 patent  bypass grafts. The LAD artery has a 70% stenosis at the ostium of the first diagonal artery, which does not have a bypass graft. The distal LAD fills via a mammary artery bypass. The circumflex coronary artery is diffusely and severely diseased. There is a patent bypass to one of the 3 oblique marginal grafts. Right coronary artery is totally occluded and does not have a patent bypass. There are no good options for revascularization. It would be probably possible to perform angioplasty of the first diagonal artery branch of the LAD but this is unlikely to have a meaningful impact on overall left ventricular systolic function. He develops angina occasionally when he becomes very upset, but this symptom is much less prominent than the exertional dyspnea.  Permanent atrial fibrillation He has very slow  ventricular response and has essentially 100% ventricular pacing. This compounds the problems of his ischemic cardiomyopathy. He is on appropriate anticoagulation therapy. Has not had a stroke or other embolic events to my knowledge. No recent significant bleeding problems  Presence of permanent cardiac pacemaker: Single-chamber Medtronic Adapta implanted 03/24/2005 Normal device function. His dual-chamber device is now functioning in VVIR mode Very rare native ventricular beats. Greater than 99% % ventricular pacing  Orders Placed This Encounter  Procedures  . EKG 12-Lead   Meds ordered this encounter  Medications  . warfarin (COUMADIN) 5 MG tablet    Sig: Take 5 mg by mouth daily. Daily or as directed  . Omega-3 Fatty Acids (FISH OIL PO)    Sig: Take 1,200 mg by mouth daily.    Junious Silk, MD, [facility] [phone] office [phone] pager

## 2012-11-21 ENCOUNTER — Ambulatory Visit (HOSPITAL_COMMUNITY)
Admission: RE | Admit: 2012-11-21 | Discharge: 2012-11-21 | Disposition: A | Discharge status: Home / Self Care | Payer: Medicare Other | Source: Ambulatory Visit | Attending: Cardiology | Admitting: Cardiology

## 2012-11-21 DIAGNOSIS — I428 Other cardiomyopathies: Secondary | ICD-10-CM | POA: Insufficient documentation

## 2012-11-21 DIAGNOSIS — I4891 Unspecified atrial fibrillation: Secondary | ICD-10-CM | POA: Insufficient documentation

## 2012-11-21 DIAGNOSIS — R0602 Shortness of breath: Secondary | ICD-10-CM | POA: Insufficient documentation

## 2012-11-21 DIAGNOSIS — I509 Heart failure, unspecified: Secondary | ICD-10-CM | POA: Insufficient documentation

## 2012-11-21 DIAGNOSIS — I2510 Atherosclerotic heart disease of native coronary artery without angina pectoris: Secondary | ICD-10-CM | POA: Insufficient documentation

## 2012-11-21 NOTE — Progress Notes (Signed)
2D Echo Performed 11/21/2012    Thomas [name], RCS  

## 2012-11-23 ENCOUNTER — Telehealth: Payer: Self-pay | Admitting: *Deleted

## 2012-11-23 DIAGNOSIS — I442 Atrioventricular block, complete: Secondary | ICD-10-CM

## 2012-11-23 NOTE — Telephone Encounter (Signed)
Message copied by Vita Barley on Wed Nov 23, 2012  5:11 PM ------      Message from: Thurmon Fair      Created: Mon Nov 21, 2012 10:37 PM       Heart pumping function remains weak. He is pacing nearly 100% of the time. Refer to EP (Dr. Ladona Ridgel) to discuss biventricular pacemaker with or without a defibrillator ------

## 2012-11-23 NOTE — Telephone Encounter (Signed)
Echo results given through a telephone interpreter to [name].  Voiced understanding and instructed that Dr. Lubertha Basque office will call to schedule an appt.

## 2012-12-05 ENCOUNTER — Encounter: Payer: Self-pay | Admitting: Cardiology

## 2012-12-05 ENCOUNTER — Encounter: Payer: Self-pay | Admitting: Internal Medicine

## 2012-12-05 ENCOUNTER — Ambulatory Visit (INDEPENDENT_AMBULATORY_CARE_PROVIDER_SITE_OTHER): Payer: Medicare Other | Admitting: Internal Medicine

## 2012-12-05 VITALS — BP 132/84 | HR 80 | Ht 74.0 in | Wt 238.0 lb

## 2012-12-05 DIAGNOSIS — I4821 Permanent atrial fibrillation: Secondary | ICD-10-CM

## 2012-12-05 DIAGNOSIS — Z950 Presence of cardiac pacemaker: Secondary | ICD-10-CM

## 2012-12-05 DIAGNOSIS — I4891 Unspecified atrial fibrillation: Secondary | ICD-10-CM

## 2012-12-05 DIAGNOSIS — I5022 Chronic systolic (congestive) heart failure: Secondary | ICD-10-CM

## 2012-12-05 DIAGNOSIS — Z01812 Encounter for preprocedural laboratory examination: Secondary | ICD-10-CM

## 2012-12-05 NOTE — Assessment & Plan Note (Signed)
The patient has class III symptoms, an ejection fraction of 30% despite maximal medical therapy. I've recommended upgrade from his dual-chamber pacemaker to a biventricular ICD. The risk, goals, benefits, and expectations of the procedure have been discussed with the patient by way of his interpreter, and he wishes to proceed.

## 2012-12-05 NOTE — Patient Instructions (Addendum)
Your physician recommends that you return for lab work in: 1 week, BMET, PTINR, CBC  Your physician has recommended that you have a defibrillator inserted (Bi Ventricular ICD). An implantable cardioverter defibrillator (ICD) is a small device that is placed in your chest or, in rare cases, your abdomen. This device uses electrical pulses or shocks to help control life-threatening, irregular heartbeats that could lead the heart to suddenly stop beating (sudden cardiac arrest). Leads are attached to the ICD that goes into your heart. This is done in the hospital and usually requires an overnight stay. Please see the instruction sheet given to you today for more information. 12/16/2012 @ 2:00 pm.  Your physician recommends that you continue on your current medications as directed. Please refer to the Current Medication list given to you today.

## 2012-12-05 NOTE — Progress Notes (Signed)
HPI [name] is referred by Dr. Royann Shivers for evaluation and consideration for upgrade to a BiV device. He has a long-standing cardiomyopathy, chronic atrial fibrillation, and complete heart block. He is a chronic systolic heart failure which is now class III. He is status post dual-chamber pacemaker insertion, and has a paced QRS duration of over 200 ms. This is left bundle branch block. For over 2 years, his ejection fraction has been 30% by echo despite maximal medical therapy with diuretics, carvedilol, losartan, and Imdur. Despite his multiple problems, the patient continues to work part-time, though his working has been much more difficult lately secondary to worsening heart failure. Attempts to up titrate his medical therapy have resulted in hypotension and dizziness. He has not had frank syncope.  No Known Allergies   Current Outpatient Prescriptions  Medication Sig Dispense Refill  . carvedilol (COREG) 6.25 MG tablet Take 2 tablets (12.5 mg total) by mouth 2 (two) times daily with a meal.  60 tablet  11  . famotidine (PEPCID) 20 MG tablet Take 20 mg by mouth daily.      . furosemide (LASIX) 40 MG tablet Take 40 mg by mouth daily.      . isosorbide mononitrate (IMDUR) 60 MG 24 hr tablet Take 1 and 1/2 tablets daily  135 tablet  1  . losartan-hydrochlorothiazide (HYZAAR) 100-12.5 MG per tablet Take 1 tablet by mouth daily.      . nitroGLYCERIN (NITROSTAT) 0.4 MG SL tablet Place 0.4 mg under the tongue every 5 (five) minutes as needed. For chest pain.      . simvastatin (ZOCOR) 20 MG tablet Take 1 tablet (20 mg total) by mouth daily.  30 tablet  5  . warfarin (COUMADIN) 5 MG tablet Take 5 mg by mouth daily. Daily or as directed      . Omega-3 Fatty Acids (FISH OIL PO) Take 1,200 mg by mouth daily.       No current facility-administered medications for this visit.     Past Medical History  Diagnosis Date  . Coronary artery disease   . Chronic atrial fibrillation   . History of bleeding  peptic ulcer 1980  . History of abdominal aortic aneurysm   . History of epididymitis 2013  . Testicular swelling   . Hydronephrosis with ureteropelvic junction obstruction   . Hydroureter on left 2009  . Status post coronary artery bypass grafting 1986    LIMA to the LAD, SVG to OM, SVG to RCA  . Presence of permanent cardiac pacemaker 03/24/2005    Implantation of a Medtronic Adapta ADDRO1, serial number [id number]  . Ischemic cardiomyopathy     EF 30-35%.    ROS:   All systems reviewed and negative except as noted in the HPI.   Past Surgical History  Procedure Laterality Date  . Coronary artery bypass graft  1986  . Insert / replace / remove pacemaker    . Transurethral resection of prostate      s/p  . Cardiac catheterization  12/10/2011    SVG to OM widely patent.  LIMA to LAD patent  . 2-d echocardiogram  11/20/2011    Ejection fraction 30-35% moderate concentric left ventricular hypertrophy. Left atrium is moderately dilated. Mild MR. Mild or  . Persantine myoview  05/06/2010    Post-rest ejection fraction 30%. No significant ischemia demonstrated. Compared to previous study there is no significant change.     No family history on file.   History   Social History  .  Marital Status: Married    Spouse Name: N/A    Number of Children: N/A  . Years of Education: N/A   Occupational History  . Not on file.   Social History Main Topics  . Smoking status: Former Smoker    Quit date: 03/15/1985  . Smokeless tobacco: Not on file  . Alcohol Use: Yes     Comment: occas.  . Drug Use: No  . Sexual Activity: Not on file   Other Topics Concern  . Not on file   Social History Narrative  . No narrative on file     BP 132/84  Pulse 80  Ht 6\' 2"  (1.88 m)  Wt 238 lb (107.956 kg)  BMI 30.54 kg/m2  Physical Exam:  Well appearing 78 year old man, NAD HEENT: Unremarkable Neck:  7 cm JVD, no thyromegally Back:  No CVA tenderness Lungs:  Clear with no wheezes,  rales, or rhonchi. HEART:  Regular rate rhythm, no murmurs, no rubs, no clicks Abd:  soft, positive bowel sounds, no organomegally, no rebound, no guarding Ext:  2 plus pulses, no edema, no cyanosis, no clubbing Skin:  No rashes no nodules Neuro:  CN II through XII intact, motor grossly intact  EKG - atrial fibrillation with ventricular pacing in a left bundle branch block pattern, QRS duration 202 ms  DEVICE  Normal device function.  See PaceArt for details.   Assess/Plan:

## 2012-12-05 NOTE — Assessment & Plan Note (Signed)
His Medtronic dual-chamber pacemaker was not interrogated today, but previously was known to be working normally.

## 2012-12-05 NOTE — Assessment & Plan Note (Signed)
His ventricular rate is well controlled. We'll plan on holding his warfarin for 2 days prior to his device implantation.

## 2012-12-05 NOTE — Addendum Note (Signed)
Addended by: Linzie Collin D on: 12/05/2012 09:28 AM   Modules accepted: Orders

## 2012-12-09 ENCOUNTER — Telehealth: Payer: Self-pay | Admitting: Internal Medicine

## 2012-12-09 NOTE — Telephone Encounter (Signed)
Follow up  Returning a call about medications.

## 2012-12-09 NOTE — Telephone Encounter (Signed)
New Problem  Pt has surgery next week (Oct 3) and asks should he take any of his medications the day before the surgery, being that he is not to eat or drink anything. Please call and speak clearly. 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

## 2012-12-09 NOTE — Telephone Encounter (Signed)
Spoke with patient through an interrupter and all of her questions were answered

## 2012-12-09 NOTE — Telephone Encounter (Signed)
Left a message that he is due for labs on Monday and based on those will determine if he needs to hold his Warfarin prior to generator change but should hold his Furosemide the morning of procedure.  May call back with questions

## 2012-12-12 ENCOUNTER — Ambulatory Visit (INDEPENDENT_AMBULATORY_CARE_PROVIDER_SITE_OTHER): Payer: Medicare Other | Admitting: Internal Medicine

## 2012-12-12 ENCOUNTER — Encounter (HOSPITAL_COMMUNITY): Payer: Self-pay

## 2012-12-12 DIAGNOSIS — I2589 Other forms of chronic ischemic heart disease: Secondary | ICD-10-CM

## 2012-12-12 DIAGNOSIS — I4821 Permanent atrial fibrillation: Secondary | ICD-10-CM

## 2012-12-12 DIAGNOSIS — I255 Ischemic cardiomyopathy: Secondary | ICD-10-CM

## 2012-12-12 DIAGNOSIS — I4891 Unspecified atrial fibrillation: Secondary | ICD-10-CM

## 2012-12-12 DIAGNOSIS — I2510 Atherosclerotic heart disease of native coronary artery without angina pectoris: Secondary | ICD-10-CM

## 2012-12-12 LAB — CBC WITH DIFFERENTIAL/PLATELET
Basophils Absolute: 0 10*3/uL (ref 0.0–0.1)
Eosinophils Absolute: 0.2 10*3/uL (ref 0.0–0.7)
HCT: 43.7 % (ref 39.0–52.0)
Hemoglobin: 14.6 g/dL (ref 13.0–17.0)
Lymphs Abs: 1.8 10*3/uL (ref 0.7–4.0)
MCHC: 33.5 g/dL (ref 30.0–36.0)
Monocytes Relative: 9.9 % (ref 3.0–12.0)
Neutro Abs: 4.6 10*3/uL (ref 1.4–7.7)
RBC: 4.71 Mil/uL (ref 4.22–5.81)
RDW: 14.2 % (ref 11.5–14.6)

## 2012-12-12 LAB — BASIC METABOLIC PANEL
BUN: 26 mg/dL — ABNORMAL HIGH (ref 6–23)
CO2: 25 mEq/L (ref 19–32)
Calcium: 9.6 mg/dL (ref 8.4–10.5)
Chloride: 109 mEq/L (ref 96–112)
GFR: 57.58 mL/min — ABNORMAL LOW (ref >60.00)
Glucose, Bld: 104 mg/dL — ABNORMAL HIGH (ref 70–99)
Potassium: 4.1 mEq/L (ref 3.5–5.1)
Sodium: 140 mEq/L (ref 135–145)

## 2012-12-14 ENCOUNTER — Other Ambulatory Visit: Payer: Medicare Other

## 2012-12-16 ENCOUNTER — Encounter (HOSPITAL_COMMUNITY)
Admission: RE | Disposition: A | Discharge status: Home / Self Care | Payer: Self-pay | Source: Ambulatory Visit | Attending: Internal Medicine

## 2012-12-16 ENCOUNTER — Encounter (HOSPITAL_COMMUNITY): Payer: Self-pay | Admitting: *Deleted

## 2012-12-16 ENCOUNTER — Ambulatory Visit (HOSPITAL_COMMUNITY)
Admission: RE | Admit: 2012-12-16 | Discharge: 2012-12-17 | Disposition: A | Discharge status: Home / Self Care | Payer: Medicare Other | Source: Ambulatory Visit | Attending: Internal Medicine | Admitting: Internal Medicine

## 2012-12-16 DIAGNOSIS — Z7901 Long term (current) use of anticoagulants: Secondary | ICD-10-CM | POA: Diagnosis not present

## 2012-12-16 DIAGNOSIS — I4891 Unspecified atrial fibrillation: Secondary | ICD-10-CM | POA: Diagnosis not present

## 2012-12-16 DIAGNOSIS — I442 Atrioventricular block, complete: Secondary | ICD-10-CM | POA: Diagnosis present

## 2012-12-16 DIAGNOSIS — I5022 Chronic systolic (congestive) heart failure: Secondary | ICD-10-CM | POA: Insufficient documentation

## 2012-12-16 DIAGNOSIS — Z79899 Other long term (current) drug therapy: Secondary | ICD-10-CM | POA: Diagnosis not present

## 2012-12-16 DIAGNOSIS — I428 Other cardiomyopathies: Secondary | ICD-10-CM | POA: Insufficient documentation

## 2012-12-16 DIAGNOSIS — I255 Ischemic cardiomyopathy: Secondary | ICD-10-CM

## 2012-12-16 HISTORY — PX: BI-VENTRICULAR IMPLANTABLE CARDIOVERTER DEFIBRILLATOR: SHX5459

## 2012-12-16 LAB — SURGICAL PCR SCREEN
MRSA, PCR: POSITIVE — AB
Staphylococcus aureus: POSITIVE — AB

## 2012-12-16 SURGERY — BI-VENTRICULAR IMPLANTABLE CARDIOVERTER DEFIBRILLATOR  (CRT-D)
Anesthesia: LOCAL

## 2012-12-16 MED ORDER — WARFARIN SODIUM 4 MG PO TABS
4.0000 mg | ORAL_TABLET | ORAL | Status: DC
  Administered 2012-12-16: 4 mg via ORAL
  Filled 2012-12-16: qty 1

## 2012-12-16 MED ORDER — ONDANSETRON HCL 4 MG/2ML IJ SOLN: 4.0000 mg | Freq: Four times a day (QID) | INTRAMUSCULAR | Status: DC | PRN

## 2012-12-16 MED ORDER — SODIUM CHLORIDE 0.9 % IV SOLN
INTRAVENOUS | Status: DC
  Administered 2012-12-16: 13:00:00 via INTRAVENOUS

## 2012-12-16 MED ORDER — CARVEDILOL 6.25 MG PO TABS
6.2500 mg | ORAL_TABLET | Freq: Two times a day (BID) | ORAL | Status: DC
  Administered 2012-12-16 – 2012-12-17 (×2): 6.25 mg via ORAL
  Filled 2012-12-16 (×4): qty 1

## 2012-12-16 MED ORDER — MIDAZOLAM HCL 5 MG/5ML IJ SOLN
INTRAMUSCULAR | Status: AC
  Filled 2012-12-16: qty 5

## 2012-12-16 MED ORDER — WARFARIN SODIUM 2 MG PO TABS
2.0000 mg | ORAL_TABLET | ORAL | Status: DC
  Filled 2012-12-16: qty 1

## 2012-12-16 MED ORDER — FENTANYL CITRATE 0.05 MG/ML IJ SOLN
INTRAMUSCULAR | Status: AC
  Filled 2012-12-16: qty 2

## 2012-12-16 MED ORDER — HEPARIN (PORCINE) IN NACL 2-0.9 UNIT/ML-% IJ SOLN
INTRAMUSCULAR | Status: AC
  Filled 2012-12-16: qty 500

## 2012-12-16 MED ORDER — WARFARIN SODIUM 2 MG PO TABS: 2.0000 mg | ORAL_TABLET | Freq: Every day | ORAL | Status: DC

## 2012-12-16 MED ORDER — CEFAZOLIN SODIUM-DEXTROSE 2-3 GM-% IV SOLR
2.0000 g | INTRAVENOUS | Status: DC
  Filled 2012-12-16: qty 50

## 2012-12-16 MED ORDER — ISOSORBIDE MONONITRATE ER 60 MG PO TB24
90.0000 mg | ORAL_TABLET | Freq: Every day | ORAL | Status: DC
  Administered 2012-12-17: 11:00:00 90 mg via ORAL
  Filled 2012-12-16: qty 1

## 2012-12-16 MED ORDER — ACETAMINOPHEN 325 MG PO TABS: 325.0000 mg | ORAL_TABLET | ORAL | Status: DC | PRN

## 2012-12-16 MED ORDER — WARFARIN - PHYSICIAN DOSING INPATIENT: Freq: Every day | Status: DC

## 2012-12-16 MED ORDER — CEFAZOLIN SODIUM-DEXTROSE 2-3 GM-% IV SOLR
2.0000 g | Freq: Four times a day (QID) | INTRAVENOUS | Status: AC
  Administered 2012-12-16 – 2012-12-17 (×3): 2 g via INTRAVENOUS
  Filled 2012-12-16 (×3): qty 50

## 2012-12-16 MED ORDER — FUROSEMIDE 40 MG PO TABS
40.0000 mg | ORAL_TABLET | Freq: Every day | ORAL | Status: DC
Start: 2012-12-17 — End: 2012-12-17
  Administered 2012-12-17: 40 mg via ORAL
  Filled 2012-12-16: qty 1

## 2012-12-16 MED ORDER — FAMOTIDINE 20 MG PO TABS
20.0000 mg | ORAL_TABLET | Freq: Every day | ORAL | Status: DC
  Administered 2012-12-16 – 2012-12-17 (×2): 20 mg via ORAL
  Filled 2012-12-16 (×2): qty 1

## 2012-12-16 MED ORDER — HYDROCHLOROTHIAZIDE 12.5 MG PO CAPS
12.5000 mg | ORAL_CAPSULE | Freq: Every day | ORAL | Status: DC
  Administered 2012-12-16 – 2012-12-17 (×2): 12.5 mg via ORAL
  Filled 2012-12-16 (×2): qty 1

## 2012-12-16 MED ORDER — LOSARTAN POTASSIUM-HCTZ 100-12.5 MG PO TABS: 1.0000 | ORAL_TABLET | Freq: Every day | ORAL | Status: DC

## 2012-12-16 MED ORDER — CHLORHEXIDINE GLUCONATE 4 % EX LIQD: 60.0000 mL | Freq: Once | CUTANEOUS | Status: DC

## 2012-12-16 MED ORDER — LIDOCAINE HCL (PF) 1 % IJ SOLN
INTRAMUSCULAR | Status: AC
  Filled 2012-12-16: qty 60

## 2012-12-16 MED ORDER — MUPIROCIN 2 % EX OINT
TOPICAL_OINTMENT | Freq: Once | CUTANEOUS | Status: AC
  Administered 2012-12-16: 1 via NASAL

## 2012-12-16 MED ORDER — SIMVASTATIN 20 MG PO TABS
20.0000 mg | ORAL_TABLET | Freq: Every evening | ORAL | Status: DC
  Filled 2012-12-16: qty 1

## 2012-12-16 MED ORDER — SODIUM CHLORIDE 0.9 % IR SOLN
80.0000 mg | Status: DC
  Filled 2012-12-16: qty 2

## 2012-12-16 MED ORDER — LOSARTAN POTASSIUM 50 MG PO TABS
100.0000 mg | ORAL_TABLET | Freq: Every day | ORAL | Status: DC
  Administered 2012-12-16 – 2012-12-17 (×2): 100 mg via ORAL
  Filled 2012-12-16 (×2): qty 2

## 2012-12-16 MED ORDER — MUPIROCIN 2 % EX OINT
TOPICAL_OINTMENT | CUTANEOUS | Status: AC
  Administered 2012-12-17: 1 via NASAL
  Filled 2012-12-16: qty 22

## 2012-12-16 MED ORDER — SODIUM CHLORIDE 0.9 % IV SOLN: INTRAVENOUS | Status: DC

## 2012-12-16 NOTE — CV Procedure (Signed)
BiV ICD upgrade from a DDD PM without immediate complication. Z#[id number].

## 2012-12-16 NOTE — Interval H&P Note (Signed)
History and Physical Interval Note:Since his prior clinic visit, no change in the history, physical exam, assessment and plan.   12/16/2012 1:21 PM  Thomas [name]  has presented today for surgery, with the diagnosis of hf  The various methods of treatment have been discussed with the patient and family. After consideration of risks, benefits and other options for treatment, the patient has consented to  Procedure(s): BI-VENTRICULAR IMPLANTABLE CARDIOVERTER DEFIBRILLATOR  (CRT-D) (N/A) as a surgical intervention .  The patient's history has been reviewed, patient examined, no change in status, stable for surgery.  I have reviewed the patient's chart and labs.  Questions were answered to the patient's satisfaction.     Thomas [name].D.ICD Criteria  Current LVEF (within 6 months):30%  NYHA Functional Classification: Class II  Heart Failure History:  Yes, Duration of heart failure since onset is > 9 months  Non-Ischemic Dilated Cardiomyopathy History:  Yes, timeframe is > 9 months  Atrial Fibrillation/Atrial Flutter:  No.  Ventricular Tachycardia History:  No.  Cardiac Arrest History:  No  History of Syndromes with Risk of Sudden Death:  No.  Previous ICD:  No.  Electrophysiology Study: No.  Anticoagulation Therapy:  Patient is on anticoagulation therapy, anticoagulation was held prior to procedure.   Beta Blocker Therapy:  Yes.   Ace Inhibitor/ARB Therapy:  Yes.

## 2012-12-16 NOTE — Op Note (Signed)
Thomas [name], VORA NO.:  [id number]  MEDICAL RECORD NO.:  [id number]  LOCATION:  3W21C                        FACILITY:  [facility]  PHYSICIAN:  Doylene Canning. Ladona Ridgel, MD    DATE OF BIRTH:  03-Apr-1934  DATE OF PROCEDURE:  12/16/2012 DATE OF DISCHARGE:                              OPERATIVE REPORT   PROCEDURE PERFORMED:  Insertion of a biventricular implantable cardioverter-defibrillator with removal of a previously implanted dual- chamber pacemaker with pacemaker pocket revision, coronary sinus, and left upper extremity venography, and defibrillation threshold testing.  INTRODUCTION:  The patient is a 78 year old man with a nonischemic cardiomyopathy and chronic systolic heart failure, ejection fraction 30% by echo despite maximal medical therapy.  He is status post dual-chamber pacemaker and has a pacing-induced left bundle-branch block with a QRS duration of about 200 milliseconds.  He is now referred for upgrade to a biventricular ICD.  DESCRIPTION OF PROCEDURE:  After informed consent was obtained, the patient was taken to the diagnostic EP lab in a fasting state.  After usual preparation and draping, intravenous fentanyl and midazolam were given for sedation.  A 40 mL of lidocaine was infiltrated into the left infraclavicular region and a 7-cm incision was carried out over this region with electrocautery utilized to dissect down to the fascial plane.  Care was initially taken not to enter the pacemaker pocket. Initial attempts to puncturing the left subclavian vein were unsuccessful.  A 10 mL of contrast was injected into the left upper extremity venous system, demonstrating that the vein was patent, but was in fact displaced compared to the old lead had injured.  The vein was then punctured x2 and a Medtronic model [id number], 65-cm defibrillation lead, serial [id number] was advanced into the right ventricle and mapping was carried out.  This R waves which were paced  were approximately 8 mV and with the lead actively fixed, the pacing threshold was less than a V at 0.5 milliseconds with a pacing impedance of 575 ohms.  A 10 V pacing did not stimulate the diaphragm.  With the ventricular lead in satisfactory position, attention was then turned to placement of the left ventricular lead.  A 6-French hexapolar EP catheter along with a Medtronic MB2 guiding catheter was advanced into the right atrium.  The coronary sinus was cannulated and the mapping catheter was advanced into the coronary sinus.  The guiding catheter was advanced over the mapping catheter and into the coronary sinus.  Venography of the coronary sinus was carried out.  This demonstrated a smaller lateral vein and a fairly large posterior vein which coursed up into the posterior lateral portion of the left ventricle.  This vein that was selected for LV lead placement. The Medtronic model 4298, 88-cm quadripolar LV pacing lead, serial #[id number] was advanced over the guidewire and into the posterolateral vein of the left ventricle.  The pacing threshold was somewhat variable at different pacing locations, typically between 1 to 2 V at 0.5 milliseconds.  The pacing impedance was around 400 ohms.  With these relatively satisfactory parameters, the guiding catheter was removed from the LV lead in the usual manner.  The LV lead remained in stable position.  The lead was secured to the subpectoral fascia with a figure- of-eight silk suture.  The sewing sleeves were secured with silk suture. Electrocautery was then utilized to expand and revise the subcutaneous pocket.  This was done to accommodate larger ICD and new defibrillator lead.  Having accomplished this, the old atrial and RV pacing leads were disconnected from the old dual-chamber pacemaker and the old RV pacing lead was capped.  The atrial lead was plugged into the new Medtronic Viva Quad XT CRT-D, serial [id number] and the  defibrillator lead and the LV lead were also plugged into this new Medtronic device.  The pocket was again irrigated and the device was placed back into the subcutaneous pocket.  The pocket was irrigated with additional antibiotic irrigation and the incision was closed with 2-0 and 3-0 Vicryl.  At this point, I scrubbed out of the procedures, supervised defibrillation threshold testing.  After the patient was more deeply sedated with fentanyl and Versed, VF was induced with a T-wave shock.  A 20-joule shock was then delivered which terminated ventricular fibrillation as well as atrial tachycardia/flutter and restored sinus rhythm.  Because the patient had not been therapeutically anticoagulated for 3 weeks, he was subsequently paced back into atrial flutter/tachycardia.  COMPLICATIONS:  There were no immediate procedure complications.  RESULTS:  This demonstrates successful biventricular ICD upgrade of a dual-chamber pacemaker without immediate procedure complications.     Doylene Canning. Ladona Ridgel, MD     GWT/MEDQ  D:  12/16/2012  T:  12/16/2012  Job:  [id number]  cc:   Thurmon Fair, MD

## 2012-12-16 NOTE — Progress Notes (Signed)
Spoke with translator around 2200 and she stated she would be here tomorrow December 17, 2012 around 1100 to see the pt. Will pass on to next shift RN. Meanwhile written communication is being used. Sanda Linger, RN

## 2012-12-16 NOTE — H&P (View-Only) (Signed)
HPI [name] is referred by Dr. Royann Shivers for evaluation and consideration for upgrade to a BiV device. He has a long-standing cardiomyopathy, chronic atrial fibrillation, and complete heart block. He is a chronic systolic heart failure which is now class III. He is status post dual-chamber pacemaker insertion, and has a paced QRS duration of over 200 ms. This is left bundle branch block. For over 2 years, his ejection fraction has been 30% by echo despite maximal medical therapy with diuretics, carvedilol, losartan, and Imdur. Despite his multiple problems, the patient continues to work part-time, though his working has been much more difficult lately secondary to worsening heart failure. Attempts to up titrate his medical therapy have resulted in hypotension and dizziness. He has not had frank syncope.  No Known Allergies   Current Outpatient Prescriptions  Medication Sig Dispense Refill  . carvedilol (COREG) 6.25 MG tablet Take 2 tablets (12.5 mg total) by mouth 2 (two) times daily with a meal.  60 tablet  11  . famotidine (PEPCID) 20 MG tablet Take 20 mg by mouth daily.      . furosemide (LASIX) 40 MG tablet Take 40 mg by mouth daily.      . isosorbide mononitrate (IMDUR) 60 MG 24 hr tablet Take 1 and 1/2 tablets daily  135 tablet  1  . losartan-hydrochlorothiazide (HYZAAR) 100-12.5 MG per tablet Take 1 tablet by mouth daily.      . nitroGLYCERIN (NITROSTAT) 0.4 MG SL tablet Place 0.4 mg under the tongue every 5 (five) minutes as needed. For chest pain.      . simvastatin (ZOCOR) 20 MG tablet Take 1 tablet (20 mg total) by mouth daily.  30 tablet  5  . warfarin (COUMADIN) 5 MG tablet Take 5 mg by mouth daily. Daily or as directed      . Omega-3 Fatty Acids (FISH OIL PO) Take 1,200 mg by mouth daily.       No current facility-administered medications for this visit.     Past Medical History  Diagnosis Date  . Coronary artery disease   . Chronic atrial fibrillation   . History of bleeding  peptic ulcer 1980  . History of abdominal aortic aneurysm   . History of epididymitis 2013  . Testicular swelling   . Hydronephrosis with ureteropelvic junction obstruction   . Hydroureter on left 2009  . Status post coronary artery bypass grafting 1986    LIMA to the LAD, SVG to OM, SVG to RCA  . Presence of permanent cardiac pacemaker 03/24/2005    Implantation of a Medtronic Adapta ADDRO1, serial number [id number]  . Ischemic cardiomyopathy     EF 30-35%.    ROS:   All systems reviewed and negative except as noted in the HPI.   Past Surgical History  Procedure Laterality Date  . Coronary artery bypass graft  1986  . Insert / replace / remove pacemaker    . Transurethral resection of prostate      s/p  . Cardiac catheterization  12/10/2011    SVG to OM widely patent.  LIMA to LAD patent  . 2-d echocardiogram  11/20/2011    Ejection fraction 30-35% moderate concentric left ventricular hypertrophy. Left atrium is moderately dilated. Mild MR. Mild or  . Persantine myoview  05/06/2010    Post-rest ejection fraction 30%. No significant ischemia demonstrated. Compared to previous study there is no significant change.     No family history on file.   History   Social History  .  Marital Status: Married    Spouse Name: N/A    Number of Children: N/A  . Years of Education: N/A   Occupational History  . Not on file.   Social History Main Topics  . Smoking status: Former Smoker    Quit date: 03/15/1985  . Smokeless tobacco: Not on file  . Alcohol Use: Yes     Comment: occas.  . Drug Use: No  . Sexual Activity: Not on file   Other Topics Concern  . Not on file   Social History Narrative  . No narrative on file     BP 132/84  Pulse 80  Ht 6\' 2"  (1.88 m)  Wt 238 lb (107.956 kg)  BMI 30.54 kg/m2  Physical Exam:  Well appearing 78 year old man, NAD HEENT: Unremarkable Neck:  7 cm JVD, no thyromegally Back:  No CVA tenderness Lungs:  Clear with no wheezes,  rales, or rhonchi. HEART:  Regular rate rhythm, no murmurs, no rubs, no clicks Abd:  soft, positive bowel sounds, no organomegally, no rebound, no guarding Ext:  2 plus pulses, no edema, no cyanosis, no clubbing Skin:  No rashes no nodules Neuro:  CN II through XII intact, motor grossly intact  EKG - atrial fibrillation with ventricular pacing in a left bundle branch block pattern, QRS duration 202 ms  DEVICE  Normal device function.  See PaceArt for details.   Assess/Plan:

## 2012-12-17 ENCOUNTER — Ambulatory Visit (HOSPITAL_COMMUNITY): Payer: Medicare Other

## 2012-12-17 DIAGNOSIS — I2589 Other forms of chronic ischemic heart disease: Secondary | ICD-10-CM

## 2012-12-17 DIAGNOSIS — I442 Atrioventricular block, complete: Secondary | ICD-10-CM | POA: Diagnosis not present

## 2012-12-17 LAB — PROTIME-INR: INR: 1.79 — ABNORMAL HIGH (ref 0.00–1.49)

## 2012-12-17 MED ORDER — CHLORHEXIDINE GLUCONATE CLOTH 2 % EX PADS
6.0000 | MEDICATED_PAD | Freq: Every day | CUTANEOUS | Status: DC
  Administered 2012-12-17: 6 via TOPICAL

## 2012-12-17 MED ORDER — MUPIROCIN 2 % EX OINT
1.0000 "application " | TOPICAL_OINTMENT | Freq: Two times a day (BID) | CUTANEOUS | Status: DC
  Administered 2012-12-17: 1 via NASAL
  Filled 2012-12-17: qty 22

## 2012-12-17 NOTE — Progress Notes (Signed)
Patient ID: Thomas [name], male   DOB: 1934-06-07, 78 y.o.   MRN: [id number]      Subjective:   Patient seen with sign language translator. No complaints this morning. No significant pain at procedure site.    Objective:   Temp:  [97 F (36.1 C)-98.2 F (36.8 C)] 98.2 F (36.8 C) (10/04 0651) Pulse Rate:  [58-68] 58 (10/04 0651) Resp:  [18-20] 18 (10/04 0651) BP: (120-138)/(71-84) 138/84 mmHg (10/04 0651) SpO2:  [92 %-97 %] 95 % (10/04 0651) Weight:  [238 lb (107.956 kg)] 238 lb (107.956 kg) (10/03 1204)    Filed Weights   12/16/12 1204  Weight: 238 lb (107.956 kg)    Intake/Output Summary (Last 24 hours) at 12/17/12 1120 Last data filed at 12/17/12 0500  Gross per 24 hour  Intake      0 ml  Output    550 ml  Net   -550 ml    Telemetry: V-paced  Exam:  General:NAD  HEENT:sclera clear,pupils equal round reactive, no palpable adenopathy  Resp:CTAB  Cardiac:RRR, 3/6 systolic murmur at apex, no JVD, no carotid bruits  GI:abd soft, NT, ND  WUJ:WJXBJYNWGN warm, no edema   Psych: appropriate affect  Lab Results:  Basic Metabolic Panel:  Recent Labs Lab 12/12/12 0808  NA 140  K 4.1  CL 109  CO2 25  GLUCOSE 104*  BUN 26*  CREATININE 1.3  CALCIUM 9.6    Liver Function Tests: No results found for this basename: AST, ALT, ALKPHOS, BILITOT, PROT, ALBUMIN,  in the last 168 hours  CBC:  Recent Labs Lab 12/12/12 0808  WBC 7.4  HGB 14.6  HCT 43.7  MCV 92.7  PLT 173.0    Cardiac Enzymes: No results found for this basename: CKTOTAL, CKMB, CKMBINDEX, TROPONINI,  in the last 168 hours  BNP: No results found for this basename: PROBNP,  in the last 8760 hours  Coagulation:  Recent Labs Lab 12/12/12 0808 12/16/12 1237 12/17/12 0610  INR 2.4* 1.90* 1.79*    ECG:   Medications:   Scheduled Medications: . carvedilol  6.25 mg Oral BID WC  .  ceFAZolin (ANCEF) IV  2 g Intravenous Q6H  . Chlorhexidine Gluconate Cloth  6 each Topical Q0600    . famotidine  20 mg Oral Daily  . furosemide  40 mg Oral Daily  . hydrochlorothiazide  12.5 mg Oral Daily  . isosorbide mononitrate  90 mg Oral Daily  . losartan  100 mg Oral Daily  . mupirocin ointment  1 application Nasal BID  . simvastatin  20 mg Oral QPM  . warfarin  2 mg Oral Custom  . warfarin  4 mg Oral Custom  . Warfarin - Physician Dosing Inpatient   Does not apply q1800     Infusions:       PRN Medications:  acetaminophen, ondansetron (ZOFRAN) IV     Assessment/Plan   78 yo male history of chronic systolic heart failure LVEF 30%, chronic afib, and complete heart block, with prior dual chamber pacemaker placement. He presented yesterday for upgrade to BiV ICD, tolerated procedure without complication.   1. Chronic systolic heart failure - successful implantation of BiV AICD yesterday, no pain at site - patient will be discharged home today, follow up with EP clinic at outpatient - continue current medications.       Dina Rich, M.D., F.A.C.C.

## 2012-12-17 NOTE — Discharge Summary (Signed)
Physician Discharge Summary  Patient ID: Thomas [name] MRN: [id number] DOB/AGE: 1934/05/03 78 y.o.  Admit date: 12/16/2012 Discharge date: 12/17/2012 Primary Discharge Diagnosis: 1. CHB S/P BiV ICD upgrade on 12/16/12 Medtronic  Viva Quad XT CRT-D, serial #[id number] and the defibrillator lead and  the LV lead were also plugged into this new Medtronic device  Secondary Discharge Diagnosis: 1. Chronic Atrial fibrillation 2. Systolic CM Class III EF of 30-35%.  Significant Diagnostic Studies: Echocardiogram: 11/21/12 Left ventricle: Systolic function was moderately to severely reduced. The estimated ejection fraction was in the range of 30% to 35%. Akinesis and scarring of the inferolateral and inferior myocardium; consistent with infarction in the distribution of the right coronary and left circumflex coronary artery. Features are consistent with a pseudonormal left ventricular filling pattern, with concomitant abnormal relaxation and increased filling pressure (grade 2 diastolic dysfunction). - Ventricular septum: Septal motion showed abnormal function, dyssynergy, and paradox. These changes are consistent with right ventricular pacing. - Aortic valve: Mild regurgitation. - Mitral valve: Moderate regurgitation directed eccentrically and posteriorly. - Left atrium: The atrium was severely dilated. - Right ventricle: The cavity size was moderately dilated. Wall thickness was normal. - Right atrium: The atrium was moderately dilated. - Atrial septum: No defect or patent foramen ovale was identified. - Tricuspid valve: Moderate regurgitation.   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 Rozetta Nunnery and Dr. Sharrell Ku admitted for IV ICD pacemaker upgrade in the setting of long-standing heart myopathy, chronic atrial fibrillation, and complete heart block. The patient was admitted on 12/16/2012 for implantation. He tolerated procedure well without  evidence of bleeding or infection. A Medtronic [employer] XT CRT-D, serial [id number] and the defibrillator lead and  the LV lead were also plugged into this new Medtronic device.    The patient was seen and examined by Dr. Dina Rich on rounds, found to be stable for discharge. Verbal instructions were given to the patient via interpreter, for signing. Multiple questions were answered. He was given written instructions on discharge for pacemaker care and use of sling. He will followup with the pacemaker clinic in 2 weeks, Dr. Ladona Ridgel in 3 months, and should followup with his primary cardiologist within 3 months as well. He is to see his PCP, Dr. Dorris Fetch for INR check and warfarin dosing. They are aware of the need to do this.    Discharge Exam: Blood pressure 138/84, pulse 58, temperature 98.2 F (36.8 C), temperature source Oral, resp. rate 18, height 6\' 2"  (1.88 m), weight 238 lb (107.956 kg), SpO2 95.00%.  Labs:   Lab Results  Component Value Date   WBC 7.4 12/12/2012   HGB 14.6 12/12/2012   HCT 43.7 12/12/2012   MCV 92.7 12/12/2012   PLT 173.0 12/12/2012     Recent Labs Lab 12/12/12 0808  NA 140  K 4.1  CL 109  CO2 25  BUN 26*  CREATININE 1.3  CALCIUM 9.6  GLUCOSE 104*        Radiology: Dg Chest 2 View  12/17/2012   CLINICAL DATA:  Status post biventricular ICD upgrade.  EXAM: CHEST  2 VIEW  COMPARISON:  12/01/2011  FINDINGS: Interval revision of left chest wall ICD. The leads are within the right atrial appendage and right ventricle. The heart size is enlarged. There is no pleural effusion or edema. No airspace consolidation identified. No pneumothorax identified.  IMPRESSION: 1. No evidence for CHF.  2. No COMPLICATIONS following ICD revision.  Electronically Signed   By: Signa Kell M.D.   On: 12/17/2012 07:28    EKG: V-Paced  FOLLOW UP PLANS AND APPOINTMENTS      Future Appointments Provider Department Dept Phone   12/29/2012 10:30 AM [facility] Device 1  [facility] Office [phone]       Medication List    ASK your doctor about these medications       carvedilol 6.25 MG tablet  Commonly known as:  COREG  Take 6.25 mg by mouth 2 (two) times daily with a meal.     famotidine 20 MG tablet  Commonly known as:  PEPCID  Take 20 mg by mouth daily.     furosemide 40 MG tablet  Commonly known as:  LASIX  Take 40 mg by mouth daily.     isosorbide mononitrate 60 MG 24 hr tablet  Commonly known as:  IMDUR  Take 90 mg by mouth daily.     losartan-hydrochlorothiazide 100-12.5 MG per tablet  Commonly known as:  HYZAAR  Take 1 tablet by mouth daily.     nitroGLYCERIN 0.4 MG SL tablet  Commonly known as:  NITROSTAT  Place 0.4 mg under the tongue every 5 (five) minutes as needed. For chest pain.     simvastatin 20 MG tablet  Commonly known as:  ZOCOR  Take 20 mg by mouth every evening.     warfarin 4 MG tablet  Commonly known as:  COUMADIN  Take 2-4 mg by mouth daily. 4 mg on Monday and Friday, 2 mg on Tuesday, Wednesday, Thursday, Saturday, and Sunday.       Follow-up Information   Follow up with Lewayne Bunting, MD. (Our office is going to call you to make appt in 3 months with Dr. Ladona Ridgel.)    Specialty:  Cardiology   Contact information:   [address] [place] [address] [phone]         Time spent with patient to include physician time: 60 minutes Signed: Joni Reining 12/17/2012, 12:18 PM Co-Sign MD

## 2012-12-29 ENCOUNTER — Ambulatory Visit (INDEPENDENT_AMBULATORY_CARE_PROVIDER_SITE_OTHER): Payer: Medicare Other | Admitting: *Deleted

## 2012-12-29 DIAGNOSIS — I2589 Other forms of chronic ischemic heart disease: Secondary | ICD-10-CM

## 2012-12-29 DIAGNOSIS — I255 Ischemic cardiomyopathy: Secondary | ICD-10-CM

## 2012-12-29 DIAGNOSIS — I5022 Chronic systolic (congestive) heart failure: Secondary | ICD-10-CM

## 2012-12-29 LAB — ICD DEVICE OBSERVATION
AL AMPLITUDE: 1 mv
DEV-0020ICD: NEGATIVE
LV LEAD IMPEDENCE ICD: 399 Ohm
RV LEAD IMPEDENCE ICD: 437 Ohm
RV LEAD THRESHOLD: 1 V
VENTRICULAR PACING ICD: 98.3 pct

## 2012-12-29 NOTE — Patient Instructions (Signed)
May wash the area with soap and water.  No oils or lotions to the site. No lifting more than 10 lbs with the left arm for 2 weeks then gradually increase.   Return office visit in January with Dr. Ladona Ridgel. If the device shocks you 1 time and you feel ok call the office @ [phone] and ask for the Device Clinic. If the device shocks you 1 time and you don't feel well, chest pain, shortness of breath or dizziness call 911.

## 2012-12-29 NOTE — Progress Notes (Signed)
Wound check appointment. Steri-strips removed. Wound without redness or edema. Incision edges approximated, wound well healed. Normal device function. Thresholds, sensing, and impedances consistent with implant measurements. Device programmed at 3.5V for extra safety margin until 3 month visit. Histogram distribution appropriate for patient and level of activity. 1.1% A-fib, + coumadin.  No ventricular arrhythmias noted. Patient educated about wound care, arm mobility, lifting restrictions, shock plan. ROV in 3 months with implanting physician.

## 2013-01-09 ENCOUNTER — Encounter: Payer: Self-pay | Admitting: Internal Medicine

## 2013-01-23 ENCOUNTER — Other Ambulatory Visit: Payer: Self-pay | Admitting: Cardiovascular Disease

## 2013-01-23 NOTE — Telephone Encounter (Signed)
Rx was sent to pharmacy electronically. 

## 2013-02-03 ENCOUNTER — Other Ambulatory Visit: Payer: Self-pay | Admitting: Cardiovascular Disease

## 2013-02-03 NOTE — Telephone Encounter (Signed)
Rx was sent to pharmacy electronically. 

## 2013-02-13 ENCOUNTER — Encounter: Payer: Self-pay | Admitting: Cardiovascular Disease

## 2013-02-13 ENCOUNTER — Ambulatory Visit (INDEPENDENT_AMBULATORY_CARE_PROVIDER_SITE_OTHER): Payer: Medicare Other | Admitting: Cardiovascular Disease

## 2013-02-13 VITALS — BP 120/73 | HR 88 | Ht 74.0 in | Wt 237.7 lb

## 2013-02-13 DIAGNOSIS — I5022 Chronic systolic (congestive) heart failure: Secondary | ICD-10-CM

## 2013-02-13 DIAGNOSIS — I509 Heart failure, unspecified: Secondary | ICD-10-CM

## 2013-02-13 DIAGNOSIS — I428 Other cardiomyopathies: Secondary | ICD-10-CM

## 2013-02-13 DIAGNOSIS — Z95810 Presence of automatic (implantable) cardiac defibrillator: Secondary | ICD-10-CM

## 2013-02-13 DIAGNOSIS — I2510 Atherosclerotic heart disease of native coronary artery without angina pectoris: Secondary | ICD-10-CM

## 2013-02-13 DIAGNOSIS — I4821 Permanent atrial fibrillation: Secondary | ICD-10-CM

## 2013-02-13 DIAGNOSIS — I4891 Unspecified atrial fibrillation: Secondary | ICD-10-CM

## 2013-02-13 LAB — ICD DEVICE OBSERVATION

## 2013-02-13 NOTE — Patient Instructions (Signed)
Follow up with the [facility] office as scheduled.   Your physician recommends that you schedule a follow-up appointment in: 4 months

## 2013-02-15 LAB — MDC_IDC_ENUM_SESS_TYPE_INCLINIC
Battery Remaining Longevity: 6.9
Brady Statistic AS VP Percent: 98.7 %
HighPow Impedance: 69 Ohm
Lead Channel Impedance Value: 380 Ohm
Lead Channel Pacing Threshold Amplitude: 1.5 V
Lead Channel Pacing Threshold Pulse Width: 0.4 ms
Lead Channel Pacing Threshold Pulse Width: 0.4 ms
Lead Channel Setting Pacing Amplitude: 2.75 V
Lead Channel Setting Pacing Amplitude: 3.5 V
Lead Channel Setting Pacing Pulse Width: 0.4 ms
Lead Channel Setting Pacing Pulse Width: 0.4 ms
Lead Channel Setting Sensing Sensitivity: 0.3 mV

## 2013-02-19 NOTE — Assessment & Plan Note (Signed)
Just 2 months following upgrade of his pacemaker to a CRT device he feels substantially better. He now has NYHA functional class II, has rosy cheeks and is back to his usual jovial and energetic this position. He has clearly benefited from this device. He is on appropriate treatment with an angiotensin receptor blocker and carvedilol in the maximum dose that he has tolerated so far. We may try to further increase the carvedilol at future appointments.

## 2013-02-19 NOTE — Progress Notes (Signed)
Patient ID: Thomas [name], male   DOB: 1934/06/01, 78 y.o.   MRN: [id number]      Reason for office visit CHF, atrial fibrillation, ischemic cardiomyopathy Recent implantation of CRT-D device  Thomas [name] has a long-standing history of coronary disease and underwent bypass surgery in 1995. Recently he has had substantial deterioration in clinical status. His left ventricular ejection fraction is about 30% and he had progressed to NYHA functional class 3-4. He had a dual-chamber permanent pacemaker for complete heart block, but seemed to have permanent atrial fibrillation. Less than 2 months after upgrade of his pacemaker to a biventricular device, from steal substantially better he is alert he return to work. He is much more energetic. His wife has noticed a change and describes it as dramatic. He is taking a relatively low dose of loop diuretic. He does not have any cardiac complaints.  No Known Allergies  Current Outpatient Prescriptions  Medication Sig Dispense Refill  . carvedilol (COREG) 6.25 MG tablet Take 2 tablets (12.5 mg total) by mouth 2 (two) times daily with a meal.  120 tablet  5  . famotidine (PEPCID) 20 MG tablet Take 20 mg by mouth daily.      . furosemide (LASIX) 40 MG tablet Take 40 mg by mouth daily.      . isosorbide mononitrate (IMDUR) 60 MG 24 hr tablet TAKE 1 & 1/2 TABLETS DAILY  135 tablet  1  . losartan-hydrochlorothiazide (HYZAAR) 100-12.5 MG per tablet Take 1 tablet by mouth daily.      . nitroGLYCERIN (NITROSTAT) 0.4 MG SL tablet Place 0.4 mg under the tongue every 5 (five) minutes as needed. For chest pain.      . simvastatin (ZOCOR) 20 MG tablet Take 20 mg by mouth every evening.      . warfarin (COUMADIN) 4 MG tablet Take 2-4 mg by mouth daily. 4 mg on Monday and Friday, 2 mg on Tuesday, Wednesday, Thursday, Saturday, and Sunday.       No current facility-administered medications for this visit.    Past Medical History  Diagnosis Date  . Coronary artery  disease   . Chronic atrial fibrillation   . History of bleeding peptic ulcer 1980  . History of abdominal aortic aneurysm   . History of epididymitis 2013  . Testicular swelling   . Hydronephrosis with ureteropelvic junction obstruction   . Hydroureter on left 2009  . Status post coronary artery bypass grafting 1986    LIMA to the LAD, SVG to OM, SVG to RCA  . Presence of permanent cardiac pacemaker 03/24/2005    Implantation of a Medtronic Adapta ADDRO1, serial number [id number]  . Ischemic cardiomyopathy     EF 30-35%.    Past Surgical History  Procedure Laterality Date  . Coronary artery bypass graft  1986  . Insert / replace / remove pacemaker    . Transurethral resection of prostate      s/p  . Cardiac catheterization  12/10/2011    SVG to OM widely patent.  LIMA to LAD patent  . 2-d echocardiogram  11/20/2011    Ejection fraction 30-35% moderate concentric left ventricular hypertrophy. Left atrium is moderately dilated. Mild MR. Mild or  . Persantine myoview  05/06/2010    Post-rest ejection fraction 30%. No significant ischemia demonstrated. Compared to previous study there is no significant change.    No family history on file.  History   Social History  . Marital Status: Married    Spouse Name: N/A  Number of Children: N/A  . Years of Education: N/A   Occupational History  . Not on file.   Social History Main Topics  . Smoking status: Former Smoker    Quit date: 03/15/1985  . Smokeless tobacco: Not on file  . Alcohol Use: Yes     Comment: occas.  . Drug Use: No  . Sexual Activity: Not Currently   Other Topics Concern  . Not on file   Social History Narrative  . No narrative on file    Review of systems: The patient specifically denies any chest pain at rest or with exertion, dyspnea at rest or with exertion, orthopnea, paroxysmal nocturnal dyspnea, syncope, palpitations, focal neurological deficits, intermittent claudication, lower extremity  edema, unexplained weight gain, cough, hemoptysis or wheezing.  The patient also denies abdominal pain, nausea, vomiting, dysphagia, diarrhea, constipation, polyuria, polydipsia, dysuria, hematuria, frequency, urgency, abnormal bleeding or bruising, fever, chills, unexpected weight changes, mood swings, change in skin or hair texture, change in voice quality, auditory or visual problems, allergic reactions or rashes, new musculoskeletal complaints other than usual "aches and pains".   PHYSICAL EXAM BP 120/73  Pulse 88  Ht 6\' 2"  (1.88 m)  Wt 237 lb 11.2 oz (107.82 kg)  BMI 30.51 kg/m2 General: Alert, oriented x3, no distress  Head: no evidence of trauma, PERRL, EOMI, no exophtalmos or lid lag, no myxedema, no xanthelasma; normal ears, nose and oropharynx  Neck: normal jugular venous pulsations and no hepatojugular reflux; brisk carotid pulses without delay and no carotid bruits  Chest: clear to auscultation, no signs of consolidation by percussion or palpation, normal fremitus, symmetrical and full respiratory excursions ; healthy device scar Cardiovascular: normal position and quality of the apical impulse, regular rhythm, normal first and paradoxically split second heart sounds, no rubs or gallops,2-3/6 systolic ejection murmur heard in the aortic focus in the left lower sternal border  Abdomen: no tenderness or distention, no masses by palpation, no abnormal pulsatility or arterial bruits, normal bowel sounds, no hepatosplenomegaly  Extremities: no clubbing, cyanosis or edema; 2+ radial, ulnar and brachial pulses bilaterally; 2+ right femoral, posterior tibial and dorsalis pedis pulses; 2+ left femoral, posterior tibial and dorsalis pedis pulses; no subclavian or femoral bruits  Neurological: grossly nonfocal   EKG: AFib, V paced. Note that the QRS duration has decreased from 200 ms to 154 ms with biventricular pacing  BMET    Component Value Date/Time   NA 140 12/12/2012 0808   K 4.1  12/12/2012 0808   CL 109 12/12/2012 0808   CO2 25 12/12/2012 0808   GLUCOSE 104* 12/12/2012 0808   BUN 26* 12/12/2012 0808   CREATININE 1.3 12/12/2012 0808   CALCIUM 9.6 12/12/2012 0808   GFRNONAA 49* 11/15/2007 0425   GFRAA  Value: 60        The eGFR has been calculated using the MDRD equation. This calculation has not been validated in all clinical* 11/15/2007 0425     ASSESSMENT AND PLAN Biventricular ICD Medtronic Viva single chamber October 2014 CRT-D device check in office. Thresholds and sensing consistent with previous device measurements. Lead impedance trends stable over time. 1628 AT/AF episodes recorded(10.1%)---max dur. 14 mins, Max A 462, Max Avg V 103---AFL per EGMs. Patient's  sensitivity was increased to 0.4mV to ensure that AF is indeed permanent and never undersensed. No ventricular arrhythmia episodes recorded. Patient bi-ventricularly pacing 100% of the time with 1% as VSRp---5 VS episodes. Device programmed with  appropriate safety margins. Heart failure diagnostics reviewed  and trends are still in the initial state. RV output decreased today to chronic setting. Estimated longevity >6 years.  Patient will follow up with Dr Ladona Ridgel as scheduled and I will see him in 4 months.   Chronic systolic heart failure Just 2 months following upgrade of his pacemaker to a CRT device he feels substantially better. He now has NYHA functional class II, has rosy cheeks and is back to his usual jovial and energetic this position. He has clearly benefited from this device. He is on appropriate treatment with an angiotensin receptor blocker and carvedilol in the maximum dose that he has tolerated so far. We may try to further increase the carvedilol at future appointments.  Permanent atrial fibrillation He is appropriately anticoagulated. We have presumed that he is in permanent atrial fibrillation. I suspect that the episodes of atrial paced rhythm may be due to atrial under sensing and we made the  necessary changes facial sensitivity to make sure that this is the case. If he truly has periods when the atrial fibrillation stops, this might be an opportunity for further improvement in his clinical status by using antiarrhythmic therapy.  CAD Status post coronary artery bypass grafting: 1995 Repeat cardiac catheterization roughly one year ago shows severe native coronary artery disease as well as only 2 patent bypass grafts. The LAD artery has a 70% stenosis at the ostium of the first diagonal artery, which does not have a bypass graft. The distal LAD fills via a mammary artery bypass. The circumflex coronary artery is diffusely and severely diseased. There is a patent bypass to one of the 3 oblique marginal grafts. Right coronary artery is totally occluded and does not have a patent bypass. There are no good options for revascularization.   Patient Instructions  Follow up with the [facility] office as scheduled.   Your physician recommends that you schedule a follow-up appointment in: 4 months     Orders Placed This Encounter  Procedures  . Implantable device check  Aarik Blank  Thurmon Fair, MD, [facility] [phone] office [phone] pager

## 2013-02-19 NOTE — Assessment & Plan Note (Signed)
Repeat cardiac catheterization roughly one year ago shows severe native coronary artery disease as well as only 2 patent bypass grafts. The LAD artery has a 70% stenosis at the ostium of the first diagonal artery, which does not have a bypass graft. The distal LAD fills via a mammary artery bypass. The circumflex coronary artery is diffusely and severely diseased. There is a patent bypass to one of the 3 oblique marginal grafts. Right coronary artery is totally occluded and does not have a patent bypass. There are no good options for revascularization.

## 2013-02-19 NOTE — Assessment & Plan Note (Signed)
CRT-D device check in office. Thresholds and sensing consistent with previous device measurements. Lead impedance trends stable over time. 1628 AT/AF episodes recorded(10.1%)---max dur. 14 mins, Max A 462, Max Avg V 103---AFL per EGMs. Patient's  sensitivity was increased to 0.4mV to ensure that AF is indeed permanent and never undersensed. No ventricular arrhythmia episodes recorded. Patient bi-ventricularly pacing 100% of the time with 1% as VSRp---5 VS episodes. Device programmed with  appropriate safety margins. Heart failure diagnostics reviewed and trends are still in the initial state. RV output decreased today to chronic setting. Estimated longevity >6 years.  Patient will follow up with Dr Ladona Ridgel as scheduled and I will see him in 4 months.

## 2013-02-19 NOTE — Assessment & Plan Note (Signed)
He is appropriately anticoagulated. We have presumed that he is in permanent atrial fibrillation. I suspect that the episodes of atrial paced rhythm may be due to atrial under sensing and we made the necessary changes facial sensitivity to make sure that this is the case. If he truly has periods when the atrial fibrillation stops, this might be an opportunity for further improvement in his clinical status by using antiarrhythmic therapy.

## 2013-02-28 ENCOUNTER — Telehealth: Payer: Self-pay | Admitting: *Deleted

## 2013-02-28 NOTE — Telephone Encounter (Signed)
Wife notified Dr. Salena Saner is unable to sign the CERTIFICATION OF DISABILITY FOR PROPERTY TAX EXCLUSION because the patient is currently working.   This was done through an interpreter.  Wife did not totally understand and was not happy with the answer so she hung up.

## 2013-02-28 NOTE — Telephone Encounter (Signed)
Forwarded to B. Lassiter, CMA.  

## 2013-02-28 NOTE — Telephone Encounter (Signed)
Pt was calling in regards to having the paperwork filled out that she dropped off this morning. She wants to pick it up today before we close.

## 2013-03-17 ENCOUNTER — Other Ambulatory Visit: Payer: Self-pay | Admitting: Cardiovascular Disease

## 2013-03-17 NOTE — Telephone Encounter (Signed)
Rx was sent to pharmacy electronically. 

## 2013-03-25 ENCOUNTER — Other Ambulatory Visit: Payer: Self-pay | Admitting: Cardiovascular Disease

## 2013-03-27 NOTE — Telephone Encounter (Signed)
Rx was sent to pharmacy electronically. 

## 2013-04-05 ENCOUNTER — Encounter: Payer: Self-pay | Admitting: *Deleted

## 2013-04-13 ENCOUNTER — Encounter: Payer: Self-pay | Admitting: Internal Medicine

## 2013-04-13 ENCOUNTER — Ambulatory Visit (INDEPENDENT_AMBULATORY_CARE_PROVIDER_SITE_OTHER): Payer: Medicare Other | Admitting: Internal Medicine

## 2013-04-13 VITALS — BP 158/90 | HR 82 | Ht 74.0 in | Wt 243.0 lb

## 2013-04-13 DIAGNOSIS — I4821 Permanent atrial fibrillation: Secondary | ICD-10-CM

## 2013-04-13 DIAGNOSIS — I255 Ischemic cardiomyopathy: Secondary | ICD-10-CM

## 2013-04-13 DIAGNOSIS — I5022 Chronic systolic (congestive) heart failure: Secondary | ICD-10-CM

## 2013-04-13 DIAGNOSIS — I2589 Other forms of chronic ischemic heart disease: Secondary | ICD-10-CM

## 2013-04-13 DIAGNOSIS — Z95810 Presence of automatic (implantable) cardiac defibrillator: Secondary | ICD-10-CM

## 2013-04-13 DIAGNOSIS — I4891 Unspecified atrial fibrillation: Secondary | ICD-10-CM

## 2013-04-13 LAB — MDC_IDC_ENUM_SESS_TYPE_INCLINIC
Battery Voltage: 3.01 V
Brady Statistic AP VS Percent: 0 %
Brady Statistic AS VP Percent: 98.74 %
Date Time Interrogation Session: 20150129162038
HighPow Impedance: 285 Ohm
HighPow Impedance: 80 Ohm
Lead Channel Pacing Threshold Amplitude: 1 V
Lead Channel Pacing Threshold Amplitude: 1.625 V
Lead Channel Pacing Threshold Pulse Width: 0.4 ms
Lead Channel Pacing Threshold Pulse Width: 0.4 ms
Lead Channel Sensing Intrinsic Amplitude: 1.25 mV
Lead Channel Setting Pacing Amplitude: 2.5 V
Lead Channel Setting Pacing Pulse Width: 0.4 ms
Lead Channel Setting Pacing Pulse Width: 0.6 ms
Lead Channel Setting Sensing Sensitivity: 0.3 mV
MDC IDC MSMT BATTERY REMAINING LONGEVITY: 87 mo
MDC IDC MSMT LEADCHNL RA IMPEDANCE VALUE: 437 Ohm
MDC IDC MSMT LEADCHNL RA SENSING INTR AMPL: 1 mV
MDC IDC MSMT LEADCHNL RV IMPEDANCE VALUE: 494 Ohm
MDC IDC SET LEADCHNL RV PACING AMPLITUDE: 2 V
MDC IDC SET ZONE DETECTION INTERVAL: 400 ms
MDC IDC STAT BRADY AP VP PERCENT: 0 %
MDC IDC STAT BRADY AS VS PERCENT: 1.26 %
MDC IDC STAT BRADY RA PERCENT PACED: 0 %
MDC IDC STAT BRADY RV PERCENT PACED: 98.91 %
Zone Setting Detection Interval: 300 ms
Zone Setting Detection Interval: 350 ms
Zone Setting Detection Interval: 360 ms

## 2013-04-13 NOTE — Assessment & Plan Note (Signed)
His chronic systolic heart failure is improved. His QRS duration has been reduced by 50 ms with biventricular pacing. We'll plan to see him back in several months. He will followup with his primary cardiologist  Dr. Sallyanne Kuster.

## 2013-04-13 NOTE — Progress Notes (Signed)
HPI Mr. Brummitt returns today for followup. He is a pleasant 79 yo man with an ICM, chronic systolic CHF, LBBB, EF 55%. He underwent BiV ICD implant several months ago.  In the interim, the patient is much improved. His chronic systolic heart failure has improved from class IIIB to class IIA. He has had no syncope. He denies chest pain or peripheral edema. No ICD shock. He remains active. No Known Allergies   Current Outpatient Prescriptions  Medication Sig Dispense Refill  . carvedilol (COREG) 6.25 MG tablet Take 2 tablets (12.5 mg total) by mouth 2 (two) times daily with a meal.  120 tablet  5  . famotidine (PEPCID) 20 MG tablet Take 20 mg by mouth daily.      . furosemide (LASIX) 40 MG tablet Take 40 mg by mouth daily.      . isosorbide mononitrate (IMDUR) 60 MG 24 hr tablet TAKE 1 & 1/2 TABLETS DAILY  135 tablet  1  . losartan-hydrochlorothiazide (HYZAAR) 100-12.5 MG per tablet TAKE 1 TABLET EVERY DAY  30 tablet  11  . nitroGLYCERIN (NITROSTAT) 0.4 MG SL tablet Place 0.4 mg under the tongue every 5 (five) minutes as needed. For chest pain.      . simvastatin (ZOCOR) 20 MG tablet Take 20 mg by mouth every evening.      . warfarin (COUMADIN) 4 MG tablet Take 2-4 mg by mouth daily. 4 mg on Monday and Friday, 2 mg on Tuesday, Wednesday, Thursday, Saturday, and Sunday.       No current facility-administered medications for this visit.     Past Medical History  Diagnosis Date  . Coronary artery disease   . Chronic atrial fibrillation   . History of bleeding peptic ulcer 1980  . History of abdominal aortic aneurysm   . History of epididymitis 2013  . Testicular swelling   . Hydronephrosis with ureteropelvic junction obstruction   . Hydroureter on left 2009  . Status post coronary artery bypass grafting 1986    LIMA to the LAD, SVG to OM, SVG to RCA  . Presence of permanent cardiac pacemaker 03/24/2005    Implantation of a [place], serial number [id number]  .  Ischemic cardiomyopathy     EF 30-35%.    ROS:   All systems reviewed and negative except as noted in the HPI.   Past Surgical History  Procedure Laterality Date  . Coronary artery bypass graft  1986  . Insert / replace / remove pacemaker    . Transurethral resection of prostate      s/p  . Cardiac catheterization  12/10/2011    SVG to OM widely patent.  LIMA to LAD patent  . 2-d echocardiogram  11/20/2011    Ejection fraction 30-35% moderate concentric left ventricular hypertrophy. Left atrium is moderately dilated. Mild MR. Mild or  . Persantine myoview  05/06/2010    Post-rest ejection fraction 30%. No significant ischemia demonstrated. Compared to previous study there is no significant change.     No family history on file.   History   Social History  . Marital Status: Married    Spouse Name: N/A    Number of Children: N/A  . Years of Education: N/A   Occupational History  . Not on file.   Social History Main Topics  . Smoking status: Former Smoker    Quit date: 03/15/1985  . Smokeless tobacco: Not on file  . Alcohol Use: Yes  Comment: occas.  . Drug Use: No  . Sexual Activity: Not Currently   Other Topics Concern  . Not on file   Social History Narrative  . No narrative on file     BP 160/98  Pulse 82  Ht 6\' 2"  (1.88 m)  Wt 243 lb (110.224 kg)  BMI 31.19 kg/m2  Physical Exam:  Well appearing 79 year old man, NAD HEENT: Unremarkable Neck:  No JVD, no thyromegally Back:  No CVA tenderness Lungs:  Clear with no wheezes, rales, or rhonchi. HEART:  Regular rate rhythm, no murmurs, no rubs, no clicks Abd:  soft, positive bowel sounds, no organomegally, no rebound, no guarding Ext:  2 plus pulses, no edema, no cyanosis, no clubbing Skin:  No rashes no nodules Neuro:  CN II through XII intact, motor grossly intact  EKG - atrial fibrillation with ventricular pacing  DEVICE  Normal device function.  See PaceArt for details.   Assess/Plan:

## 2013-04-13 NOTE — Patient Instructions (Signed)
Your physician wants you to follow-up in: 12 months with Dr Knox Saliva will receive a reminder letter in the mail two months in advance. If you don't receive a letter, please call our office to schedule the follow-up appointment.     Remote monitoring is used to monitor your Pacemaker or ICD from home. This monitoring reduces the number of office visits required to check your device to one time per year. It allows [place] to keep an eye on the functioning of your device to ensure it is working properly. You are scheduled for a device check from home on 07/18/13. You may send your transmission at any time that day. If you have a wireless device, the transmission will be sent automatically. After your physician reviews your transmission, you will receive a postcard with your next transmission date.

## 2013-04-13 NOTE — Assessment & Plan Note (Signed)
Interrogation of his device demonstrates normal biventricular pacing. We'll plan to recheck in several months.

## 2013-04-13 NOTE — Assessment & Plan Note (Signed)
The patient asked to return to sinus rhythm when we checked his ICD. He now has paroxysmal atrial fibrillation. No change in medical therapy.

## 2013-05-01 ENCOUNTER — Telehealth: Payer: Self-pay | Admitting: *Deleted

## 2013-05-01 NOTE — Telephone Encounter (Signed)
Handicap placard filled out and mailed to patient.

## 2013-07-18 ENCOUNTER — Other Ambulatory Visit: Payer: Self-pay | Admitting: Internal Medicine

## 2013-07-18 ENCOUNTER — Ambulatory Visit (INDEPENDENT_AMBULATORY_CARE_PROVIDER_SITE_OTHER): Payer: Medicare Other | Admitting: *Deleted

## 2013-07-18 ENCOUNTER — Encounter: Payer: Self-pay | Admitting: Internal Medicine

## 2013-07-18 DIAGNOSIS — I4891 Unspecified atrial fibrillation: Secondary | ICD-10-CM

## 2013-07-18 DIAGNOSIS — I4821 Permanent atrial fibrillation: Secondary | ICD-10-CM

## 2013-07-18 DIAGNOSIS — I509 Heart failure, unspecified: Secondary | ICD-10-CM

## 2013-07-18 DIAGNOSIS — I428 Other cardiomyopathies: Secondary | ICD-10-CM

## 2013-07-21 ENCOUNTER — Other Ambulatory Visit: Payer: Self-pay

## 2013-07-21 MED ORDER — ISOSORBIDE MONONITRATE ER 60 MG PO TB24: ORAL_TABLET | ORAL | Status: DC

## 2013-07-21 NOTE — Telephone Encounter (Signed)
Rx was sent to pharmacy electronically. 

## 2013-07-23 LAB — MDC_IDC_ENUM_SESS_TYPE_REMOTE
Brady Statistic AP VS Percent: 0 %
Brady Statistic AS VP Percent: 96.2 %
Brady Statistic AS VS Percent: 3.8 %
HIGH POWER IMPEDANCE MEASURED VALUE: 81 Ohm
Lead Channel Impedance Value: 437 Ohm
Lead Channel Impedance Value: 494 Ohm
Lead Channel Pacing Threshold Amplitude: 1.125 V
Lead Channel Pacing Threshold Pulse Width: 0.6 ms
Lead Channel Sensing Intrinsic Amplitude: 0.9 mV
Lead Channel Sensing Intrinsic Amplitude: 15.4 mV
Lead Channel Setting Pacing Amplitude: 2.5 V
Lead Channel Setting Pacing Pulse Width: 0.6 ms
MDC IDC MSMT LEADCHNL LV IMPEDANCE VALUE: 399 Ohm
MDC IDC MSMT LEADCHNL RV PACING THRESHOLD AMPLITUDE: 0.875 V
MDC IDC MSMT LEADCHNL RV PACING THRESHOLD PULSEWIDTH: 0.4 ms
MDC IDC SET LEADCHNL RV PACING AMPLITUDE: 2 V
MDC IDC SET LEADCHNL RV PACING PULSEWIDTH: 0.4 ms
MDC IDC SET LEADCHNL RV SENSING SENSITIVITY: 0.3 mV
MDC IDC SET ZONE DETECTION INTERVAL: 360 ms
MDC IDC SET ZONE DETECTION INTERVAL: 400 ms
MDC IDC STAT BRADY AP VP PERCENT: 0 %
Zone Setting Detection Interval: 300 ms
Zone Setting Detection Interval: 350 ms

## 2013-07-27 ENCOUNTER — Encounter: Payer: Self-pay | Admitting: Cardiology

## 2013-07-28 NOTE — Progress Notes (Signed)
Remote ICD transmission.   

## 2013-07-31 ENCOUNTER — Telehealth: Payer: Self-pay | Admitting: Internal Medicine

## 2013-07-31 NOTE — Telephone Encounter (Signed)
New Message:  Pt believes Claiborne Billings was trying to reach him. He is requesting a call back.

## 2013-08-01 NOTE — Telephone Encounter (Signed)
Routing to Gasper Sells in regards to getting interpreter for support group meeting.

## 2013-08-01 NOTE — Telephone Encounter (Signed)
I did not call the patient.  Will forward to device clinic and see if they were trying to reach patient

## 2013-08-09 ENCOUNTER — Telehealth: Payer: Self-pay | Admitting: Internal Medicine

## 2013-08-09 NOTE — Telephone Encounter (Signed)
I called the patient.  Wife has to work Midwife and cannot attend the ICD SG..  Interpreter was cancelled also

## 2013-08-09 NOTE — Telephone Encounter (Signed)
They canceled for tonight   We canceled the interpreter

## 2013-08-09 NOTE — Telephone Encounter (Signed)
Thomas [name] called patient and is calling them back to let them know there will be an interpreter there tonight

## 2013-08-09 NOTE — Telephone Encounter (Signed)
New Message:  Pt's wife states she missed a call from Dr. [name] or his nurse. They are requesting a call back.

## 2013-08-26 ENCOUNTER — Other Ambulatory Visit: Payer: Self-pay | Admitting: Cardiovascular Disease

## 2013-08-28 NOTE — Telephone Encounter (Signed)
Rx was sent to pharmacy electronically. 

## 2013-09-04 ENCOUNTER — Other Ambulatory Visit: Payer: Self-pay | Admitting: Cardiovascular Disease

## 2013-09-05 NOTE — Telephone Encounter (Signed)
Rx was sent to pharmacy electronically. 

## 2013-09-21 ENCOUNTER — Inpatient Hospital Stay (HOSPITAL_COMMUNITY)
Admission: EM | Admit: 2013-09-21 | Discharge: 2013-09-26 | DRG: 249 | Disposition: A | Discharge status: Home / Self Care | Payer: Medicare Other | Attending: Cardiology | Admitting: Cardiology

## 2013-09-21 ENCOUNTER — Telehealth: Payer: Self-pay | Admitting: Cardiovascular Disease

## 2013-09-21 ENCOUNTER — Emergency Department (HOSPITAL_COMMUNITY): Payer: Medicare Other

## 2013-09-21 ENCOUNTER — Encounter (HOSPITAL_COMMUNITY): Payer: Self-pay | Admitting: Emergency Medicine

## 2013-09-21 DIAGNOSIS — I25711 Atherosclerosis of autologous vein coronary artery bypass graft(s) with angina pectoris with documented spasm: Secondary | ICD-10-CM

## 2013-09-21 DIAGNOSIS — I4891 Unspecified atrial fibrillation: Secondary | ICD-10-CM | POA: Diagnosis present

## 2013-09-21 DIAGNOSIS — I129 Hypertensive chronic kidney disease with stage 1 through stage 4 chronic kidney disease, or unspecified chronic kidney disease: Secondary | ICD-10-CM | POA: Diagnosis present

## 2013-09-21 DIAGNOSIS — Z87891 Personal history of nicotine dependence: Secondary | ICD-10-CM

## 2013-09-21 DIAGNOSIS — I10 Essential (primary) hypertension: Secondary | ICD-10-CM

## 2013-09-21 DIAGNOSIS — N183 Chronic kidney disease, stage 3 unspecified: Secondary | ICD-10-CM | POA: Diagnosis present

## 2013-09-21 DIAGNOSIS — I059 Rheumatic mitral valve disease, unspecified: Secondary | ICD-10-CM | POA: Diagnosis present

## 2013-09-21 DIAGNOSIS — H9190 Unspecified hearing loss, unspecified ear: Secondary | ICD-10-CM | POA: Diagnosis present

## 2013-09-21 DIAGNOSIS — I25119 Atherosclerotic heart disease of native coronary artery with unspecified angina pectoris: Secondary | ICD-10-CM

## 2013-09-21 DIAGNOSIS — Z955 Presence of coronary angioplasty implant and graft: Secondary | ICD-10-CM

## 2013-09-21 DIAGNOSIS — I4821 Permanent atrial fibrillation: Secondary | ICD-10-CM

## 2013-09-21 DIAGNOSIS — I214 Non-ST elevation (NSTEMI) myocardial infarction: Secondary | ICD-10-CM | POA: Diagnosis present

## 2013-09-21 DIAGNOSIS — R791 Abnormal coagulation profile: Secondary | ICD-10-CM | POA: Diagnosis present

## 2013-09-21 DIAGNOSIS — T82897A Other specified complication of cardiac prosthetic devices, implants and grafts, initial encounter: Secondary | ICD-10-CM | POA: Diagnosis present

## 2013-09-21 DIAGNOSIS — Z951 Presence of aortocoronary bypass graft: Secondary | ICD-10-CM | POA: Diagnosis not present

## 2013-09-21 DIAGNOSIS — I255 Ischemic cardiomyopathy: Secondary | ICD-10-CM

## 2013-09-21 DIAGNOSIS — Y832 Surgical operation with anastomosis, bypass or graft as the cause of abnormal reaction of the patient, or of later complication, without mention of misadventure at the time of the procedure: Secondary | ICD-10-CM | POA: Diagnosis present

## 2013-09-21 DIAGNOSIS — I2589 Other forms of chronic ischemic heart disease: Secondary | ICD-10-CM | POA: Diagnosis present

## 2013-09-21 DIAGNOSIS — I2510 Atherosclerotic heart disease of native coronary artery without angina pectoris: Secondary | ICD-10-CM | POA: Diagnosis present

## 2013-09-21 DIAGNOSIS — Z95810 Presence of automatic (implantable) cardiac defibrillator: Secondary | ICD-10-CM

## 2013-09-21 DIAGNOSIS — Z950 Presence of cardiac pacemaker: Secondary | ICD-10-CM | POA: Diagnosis not present

## 2013-09-21 DIAGNOSIS — I5022 Chronic systolic (congestive) heart failure: Secondary | ICD-10-CM

## 2013-09-21 DIAGNOSIS — I079 Rheumatic tricuspid valve disease, unspecified: Secondary | ICD-10-CM | POA: Diagnosis present

## 2013-09-21 DIAGNOSIS — I2582 Chronic total occlusion of coronary artery: Secondary | ICD-10-CM | POA: Diagnosis present

## 2013-09-21 DIAGNOSIS — Z7901 Long term (current) use of anticoagulants: Secondary | ICD-10-CM | POA: Diagnosis not present

## 2013-09-21 DIAGNOSIS — I25810 Atherosclerosis of coronary artery bypass graft(s) without angina pectoris: Secondary | ICD-10-CM | POA: Diagnosis present

## 2013-09-21 DIAGNOSIS — R079 Chest pain, unspecified: Secondary | ICD-10-CM | POA: Diagnosis present

## 2013-09-21 HISTORY — DX: Chronic kidney disease, stage 3 (moderate): N18.3

## 2013-09-21 HISTORY — DX: Chronic kidney disease, stage 3 unspecified: N18.30

## 2013-09-21 HISTORY — DX: Rheumatic tricuspid insufficiency: I07.1

## 2013-09-21 HISTORY — DX: Unspecified hearing loss, unspecified ear: H91.90

## 2013-09-21 HISTORY — DX: Presence of automatic (implantable) cardiac defibrillator: Z95.810

## 2013-09-21 HISTORY — DX: Essential (primary) hypertension: I10

## 2013-09-21 HISTORY — DX: Paroxysmal atrial fibrillation: I48.0

## 2013-09-21 LAB — TROPONIN I
Troponin I: 3.2 ng/mL (ref <0.30)
Troponin I: 4.87 ng/mL (ref <0.30)

## 2013-09-21 LAB — BASIC METABOLIC PANEL
ANION GAP: 14 (ref 5–15)
BUN: 26 mg/dL — ABNORMAL HIGH (ref 6–23)
CO2: 22 meq/L (ref 19–32)
Calcium: 9.7 mg/dL (ref 8.4–10.5)
Chloride: 106 mEq/L (ref 96–112)
Creatinine, Ser: 1.24 mg/dL (ref 0.50–1.35)
GFR calc Af Amer: 63 mL/min — ABNORMAL LOW (ref >90)
GFR calc non Af Amer: 55 mL/min — ABNORMAL LOW (ref >90)
Glucose, Bld: 101 mg/dL — ABNORMAL HIGH (ref 70–99)
Potassium: 4.6 mEq/L (ref 3.7–5.3)
Sodium: 142 mEq/L (ref 137–147)

## 2013-09-21 LAB — CBC
HCT: 41.3 % (ref 39.0–52.0)
Hemoglobin: 14 g/dL (ref 13.0–17.0)
MCH: 31.2 pg (ref 26.0–34.0)
MCHC: 33.9 g/dL (ref 30.0–36.0)
MCV: 92 fL (ref 78.0–100.0)
PLATELETS: 161 10*3/uL (ref 150–400)
RBC: 4.49 MIL/uL (ref 4.22–5.81)
RDW: 14.2 % (ref 11.5–15.5)
WBC: 7.6 10*3/uL (ref 4.0–10.5)

## 2013-09-21 LAB — PRO B NATRIURETIC PEPTIDE: Pro B Natriuretic peptide (BNP): 1456 pg/mL — ABNORMAL HIGH (ref 0–450)

## 2013-09-21 LAB — I-STAT TROPONIN, ED: Troponin i, poc: 0.21 ng/mL (ref 0.00–0.08)

## 2013-09-21 LAB — PROTIME-INR
INR: 1.76 — AB (ref 0.00–1.49)
Prothrombin Time: 20.5 seconds — ABNORMAL HIGH (ref 11.6–15.2)

## 2013-09-21 LAB — TSH: TSH: 3.21 u[IU]/mL (ref 0.350–4.500)

## 2013-09-21 LAB — MRSA PCR SCREENING: MRSA by PCR: NEGATIVE

## 2013-09-21 LAB — HEPARIN LEVEL (UNFRACTIONATED): HEPARIN UNFRACTIONATED: 0.23 [IU]/mL — AB (ref 0.30–0.70)

## 2013-09-21 MED ORDER — ACETAMINOPHEN 325 MG PO TABS
650.0000 mg | ORAL_TABLET | ORAL | Status: DC | PRN
  Administered 2013-09-25: 16:00:00 650 mg via ORAL
  Filled 2013-09-21: qty 2

## 2013-09-21 MED ORDER — MORPHINE SULFATE 4 MG/ML IJ SOLN
4.0000 mg | INTRAMUSCULAR | Status: DC | PRN
  Administered 2013-09-21: 4 mg via INTRAVENOUS
  Filled 2013-09-21: qty 1

## 2013-09-21 MED ORDER — FUROSEMIDE 40 MG PO TABS
40.0000 mg | ORAL_TABLET | Freq: Every day | ORAL | Status: DC
  Administered 2013-09-22 – 2013-09-24 (×3): 40 mg via ORAL
  Filled 2013-09-21 (×3): qty 1

## 2013-09-21 MED ORDER — LOSARTAN POTASSIUM 50 MG PO TABS
100.0000 mg | ORAL_TABLET | Freq: Every day | ORAL | Status: DC
  Administered 2013-09-22 – 2013-09-24 (×3): 100 mg via ORAL
  Filled 2013-09-21 (×4): qty 2

## 2013-09-21 MED ORDER — SIMVASTATIN 20 MG PO TABS
20.0000 mg | ORAL_TABLET | Freq: Every evening | ORAL | Status: DC
  Administered 2013-09-21 – 2013-09-25 (×5): 20 mg via ORAL
  Filled 2013-09-21 (×6): qty 1

## 2013-09-21 MED ORDER — HEPARIN (PORCINE) IN NACL 100-0.45 UNIT/ML-% IJ SOLN
1350.0000 [IU]/h | INTRAMUSCULAR | Status: DC
  Administered 2013-09-22 (×2): 1550 [IU]/h via INTRAVENOUS
  Administered 2013-09-23: 1450 [IU]/h via INTRAVENOUS
  Administered 2013-09-24 – 2013-09-25 (×2): 1350 [IU]/h via INTRAVENOUS
  Filled 2013-09-21 (×9): qty 250

## 2013-09-21 MED ORDER — ONDANSETRON HCL 4 MG/2ML IJ SOLN: 4.0000 mg | Freq: Four times a day (QID) | INTRAMUSCULAR | Status: DC | PRN

## 2013-09-21 MED ORDER — ASPIRIN EC 81 MG PO TBEC
81.0000 mg | DELAYED_RELEASE_TABLET | Freq: Every day | ORAL | Status: DC
  Administered 2013-09-22 – 2013-09-26 (×5): 81 mg via ORAL
  Filled 2013-09-21 (×5): qty 1

## 2013-09-21 MED ORDER — NITROGLYCERIN 0.4 MG SL SUBL: 0.4000 mg | SUBLINGUAL_TABLET | SUBLINGUAL | Status: DC | PRN

## 2013-09-21 MED ORDER — CLOPIDOGREL BISULFATE 300 MG PO TABS
600.0000 mg | ORAL_TABLET | Freq: Once | ORAL | Status: AC
  Administered 2013-09-21: 600 mg via ORAL
  Filled 2013-09-21: qty 2

## 2013-09-21 MED ORDER — ASPIRIN 300 MG RE SUPP: 300.0000 mg | RECTAL | Status: AC

## 2013-09-21 MED ORDER — SODIUM CHLORIDE 0.9 % IJ SOLN: 3.0000 mL | Freq: Two times a day (BID) | INTRAMUSCULAR | Status: DC

## 2013-09-21 MED ORDER — SODIUM CHLORIDE 0.9 % IV SOLN: 250.0000 mL | INTRAVENOUS | Status: DC | PRN

## 2013-09-21 MED ORDER — LOSARTAN POTASSIUM-HCTZ 100-12.5 MG PO TABS: 1.0000 | ORAL_TABLET | Freq: Every day | ORAL | Status: DC

## 2013-09-21 MED ORDER — CARVEDILOL 6.25 MG PO TABS
6.2500 mg | ORAL_TABLET | Freq: Two times a day (BID) | ORAL | Status: DC
  Administered 2013-09-21 – 2013-09-26 (×10): 6.25 mg via ORAL
  Filled 2013-09-21 (×12): qty 1

## 2013-09-21 MED ORDER — ASPIRIN 81 MG PO CHEW
324.0000 mg | CHEWABLE_TABLET | Freq: Once | ORAL | Status: AC
  Administered 2013-09-21: 324 mg via ORAL
  Filled 2013-09-21: qty 4

## 2013-09-21 MED ORDER — SODIUM CHLORIDE 0.9 % IJ SOLN: 3.0000 mL | INTRAMUSCULAR | Status: DC | PRN

## 2013-09-21 MED ORDER — HYDROCHLOROTHIAZIDE 12.5 MG PO CAPS
12.5000 mg | ORAL_CAPSULE | Freq: Every day | ORAL | Status: DC
  Administered 2013-09-22 – 2013-09-24 (×3): 12.5 mg via ORAL
  Filled 2013-09-21 (×4): qty 1

## 2013-09-21 MED ORDER — CLOPIDOGREL BISULFATE 75 MG PO TABS
75.0000 mg | ORAL_TABLET | ORAL | Status: AC
  Administered 2013-09-22: 75 mg via ORAL
  Filled 2013-09-21: qty 1

## 2013-09-21 MED ORDER — ISOSORBIDE MONONITRATE ER 60 MG PO TB24
90.0000 mg | ORAL_TABLET | Freq: Every day | ORAL | Status: DC
  Administered 2013-09-22 – 2013-09-26 (×5): 90 mg via ORAL
  Filled 2013-09-21 (×5): qty 1

## 2013-09-21 MED ORDER — NITROGLYCERIN 0.4 MG SL SUBL
0.4000 mg | SUBLINGUAL_TABLET | SUBLINGUAL | Status: DC | PRN
  Administered 2013-09-21: 0.4 mg via SUBLINGUAL
  Filled 2013-09-21: qty 1

## 2013-09-21 MED ORDER — SODIUM CHLORIDE 0.9 % IJ SOLN
3.0000 mL | Freq: Two times a day (BID) | INTRAMUSCULAR | Status: DC
  Administered 2013-09-21 – 2013-09-24 (×7): 3 mL via INTRAVENOUS

## 2013-09-21 MED ORDER — HEPARIN (PORCINE) IN NACL 100-0.45 UNIT/ML-% IJ SOLN
1350.0000 [IU]/h | INTRAMUSCULAR | Status: DC
  Administered 2013-09-21: 1350 [IU]/h via INTRAVENOUS
  Filled 2013-09-21 (×3): qty 250

## 2013-09-21 MED ORDER — SODIUM CHLORIDE 0.9 % IV SOLN
INTRAVENOUS | Status: DC
  Administered 2013-09-22: 04:00:00 via INTRAVENOUS

## 2013-09-21 MED ORDER — FAMOTIDINE 20 MG PO TABS
20.0000 mg | ORAL_TABLET | Freq: Every day | ORAL | Status: DC
  Administered 2013-09-21 – 2013-09-26 (×6): 20 mg via ORAL
  Filled 2013-09-21 (×7): qty 1

## 2013-09-21 MED ORDER — ASPIRIN 81 MG PO CHEW: 324.0000 mg | CHEWABLE_TABLET | ORAL | Status: AC

## 2013-09-21 NOTE — Care Management Note (Addendum)
  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   Anticipated DC Date:  09/26/2013   Anticipated DC Plan:  [place]  CM consult      Choice offered to / List presented to:             Status of service:  Completed, signed off Medicare Important Message given?  YES (If response is "NO", the following Medicare IM given date fields will be blank) Date Medicare IM given:  09/25/2013 Medicare IM given by:  AMERSON,JULIE Date Additional Medicare IM given:   Additional Medicare IM given by:    Discharge Disposition:  HOME/SELF CARE  Per UR Regulation:  Reviewed for med. necessity/level of care/duration of stay  If discussed at [place] of Stay Meetings, dates discussed:   09/26/2013    Comments:  Mariann Laster RN, BSN, [place], CCM  Nurse - Case Manager, (Unit [phone]  09/26/2013 Med Review:  Plavix

## 2013-09-21 NOTE — ED Provider Notes (Signed)
CSN: [id number]     Arrival date & time 09/21/13  0906 History   First MD Initiated Contact with Patient 09/21/13 0932     Chief Complaint  Patient presents with  . Chest Pain      HPI Pt was seen at 0940. Per pt and his wife, c/o gradual onset and worsening of persistent chest "pain" for the past several hours. Pt states he had an episode of mild chest "pressure" while he was getting ready for work, but decided to go to work anyway. Pt states he was walking around a lot a work and his chest "pressure" became worse. Pt took his own SL ntg with partial relief. Denies palpitations, no SOB/cough, no abd pain, no N/V/D, no back pain.    Past Medical History  Diagnosis Date  . Coronary artery disease   . Chronic atrial fibrillation   . History of bleeding peptic ulcer 1980  . History of abdominal aortic aneurysm   . History of epididymitis 2013  . Testicular swelling   . Hydronephrosis with ureteropelvic junction obstruction   . Hydroureter on left 2009  . Status post coronary artery bypass grafting 1986    LIMA to the LAD, SVG to OM, SVG to RCA  . Presence of permanent cardiac pacemaker 03/24/2005    Implantation of a [place], serial number [id number]  . Ischemic cardiomyopathy     EF 30-35%.  . Deaf    Past Surgical History  Procedure Laterality Date  . Coronary artery bypass graft  1986  . Insert / replace / remove pacemaker    . Transurethral resection of prostate      s/p  . Cardiac catheterization  12/10/2011    SVG to OM widely patent.  LIMA to LAD patent  . 2-d echocardiogram  11/20/2011    Ejection fraction 30-35% moderate concentric left ventricular hypertrophy. Left atrium is moderately dilated. Mild MR. Mild or  . Persantine myoview  05/06/2010    Post-rest ejection fraction 30%. No significant ischemia demonstrated. Compared to previous study there is no significant change.    History  Substance Use Topics  . Smoking status: Former Smoker    Quit  date: 03/15/1985  . Smokeless tobacco: Not on file  . Alcohol Use: Yes     Comment: occas.    Review of Systems ROS: Statement: All systems negative except as marked or noted in the HPI; Constitutional: Negative for fever and chills. ; ; Eyes: Negative for eye pain, redness and discharge. ; ; ENMT: Negative for ear pain, hoarseness, nasal congestion, sinus pressure and sore throat. ; ; Cardiovascular: +CP. Negative for palpitations, diaphoresis, dyspnea and peripheral edema. ; ; Respiratory: Negative for cough, wheezing and stridor. ; ; Gastrointestinal: Negative for nausea, vomiting, diarrhea, abdominal pain, blood in stool, hematemesis, jaundice and rectal bleeding. . ; ; Genitourinary: Negative for dysuria, flank pain and hematuria. ; ; Musculoskeletal: Negative for back pain and neck pain. Negative for swelling and trauma.; ; Skin: Negative for pruritus, rash, abrasions, blisters, bruising and skin lesion.; ; Neuro: Negative for headache, lightheadedness and neck stiffness. Negative for weakness, altered level of consciousness , altered mental status, extremity weakness, paresthesias, involuntary movement, seizure and syncope.      Allergies  Review of patient's allergies indicates no known allergies.  Home Medications   Prior to Admission medications   Medication Sig Start Date End Date Taking? Authorizing Provider  carvedilol (COREG) 6.25 MG tablet Take 6.25 mg by mouth 2 (two) times  daily with a meal.   Yes Historical Provider, MD  famotidine (PEPCID) 20 MG tablet Take 20 mg by mouth daily.   Yes Historical Provider, MD  furosemide (LASIX) 40 MG tablet Take 40 mg by mouth daily.   Yes Historical Provider, MD  isosorbide mononitrate (IMDUR) 60 MG 24 hr tablet Take 90 mg by mouth daily.   Yes Historical Provider, MD  losartan-hydrochlorothiazide (HYZAAR) 100-12.5 MG per tablet Take 1 tablet by mouth daily.   Yes Historical Provider, MD  nitroGLYCERIN (NITROSTAT) 0.4 MG SL tablet Place 0.4  mg under the tongue every 5 (five) minutes as needed. For chest pain.   Yes Historical Provider, MD  simvastatin (ZOCOR) 20 MG tablet Take 20 mg by mouth every evening.   Yes Historical Provider, MD  vitamin B-12 (CYANOCOBALAMIN) 500 MCG tablet Take 500 mcg by mouth daily at 12 noon.   Yes Historical Provider, MD  warfarin (COUMADIN) 4 MG tablet Take 2-4 mg by mouth daily. 4 mg on Monday and Friday, 2 mg on Tuesday, Wednesday, Thursday, Saturday, and Sunday.   Yes Historical Provider, MD   BP 129/78  Pulse 77  Temp(Src) 97.5 F (36.4 C) (Oral)  Resp 15  SpO2 92% Physical Exam 0945: Physical examination:  Nursing notes reviewed; Vital signs and O2 SAT reviewed;  Constitutional: Well developed, Well nourished, Well hydrated, In no acute distress; Head:  Normocephalic, atraumatic; Eyes: EOMI, PERRL, No scleral icterus; ENMT: Mouth and pharynx normal, Mucous membranes moist; Neck: Supple, Full range of motion, No lymphadenopathy; Cardiovascular: Regular rate and rhythm, No gallop; Respiratory: Breath sounds clear & equal bilaterally, No wheezes.  Speaking full sentences with ease, Normal respiratory effort/excursion; Chest: Nontender, Movement normal; Abdomen: Soft, Nontender, Nondistended, Normal bowel sounds; Genitourinary: No CVA tenderness; Extremities: Pulses normal, No tenderness, No edema, No calf edema or asymmetry.; Neuro: AA&Ox3, +deaf per hx, otherwise major CN grossly intact.  Speech clear. No gross focal motor or sensory deficits in extremities.; Skin: Color normal, Warm, Dry.   ED Course  Procedures     EKG Interpretation   Date/Time:  Thursday September 21 2013 09:17:49 EDT Ventricular Rate:  81 PR Interval:    QRS Duration: 150 QT Interval:  436 QTC Calculation: 506 R Axis:   -55 Text Interpretation:  Ventricular-paced rhythm Biventricular pacemaker  detected When compared with ECG of 12/17/2012 No significant change was  found Confirmed by [facility]  MD, Nunzio Cory [phone]) on  09/21/2013 9:33:05 AM      MDM  MDM Reviewed: previous chart, nursing note and vitals Reviewed previous: labs and ECG Interpretation: labs, ECG and x-ray Consults: cardiology    Results for orders placed during the hospital encounter of 09/21/13  CBC      Result Value Ref Range   WBC 7.6  4.0 - 10.5 K/uL   RBC 4.49  4.22 - 5.81 MIL/uL   Hemoglobin 14.0  13.0 - 17.0 g/dL   HCT 41.3  39.0 - 52.0 %   MCV 92.0  78.0 - 100.0 fL   MCH 31.2  26.0 - 34.0 pg   MCHC 33.9  30.0 - 36.0 g/dL   RDW 14.2  11.5 - 15.5 %   Platelets 161  150 - 400 K/uL  BASIC METABOLIC PANEL      Result Value Ref Range   Sodium 142  137 - 147 mEq/L   Potassium 4.6  3.7 - 5.3 mEq/L   Chloride 106  96 - 112 mEq/L   CO2 22  19 - 32  mEq/L   Glucose, Bld 101 (*) 70 - 99 mg/dL   BUN 26 (*) 6 - 23 mg/dL   Creatinine, Ser 1.24  0.50 - 1.35 mg/dL   Calcium 9.7  8.4 - 10.5 mg/dL   GFR calc non Af Amer 55 (*) >90 mL/min   GFR calc Af Amer 63 (*) >90 mL/min   Anion gap 14  5 - 15  PROTIME-INR      Result Value Ref Range   Prothrombin Time 20.5 (*) 11.6 - 15.2 seconds   INR 1.76 (*) 0.00 - 1.49  I-STAT TROPOININ, ED      Result Value Ref Range   Troponin i, poc 0.21 (*) 0.00 - 0.08 ng/mL   Comment NOTIFIED PHYSICIAN     Comment 3            Dg Chest Portable 1 View 09/21/2013   CLINICAL DATA:  Chest pain  EXAM: PORTABLE CHEST - 1 VIEW  COMPARISON:  12/17/2012  FINDINGS: Pulmonary vascular congestion noted with interstitial pulmonary edema. Probable small bilateral pleural effusions associated. Retrocardiac collapse/ consolidation is noted. The cardio pericardial silhouette is enlarged. Patient is status post CABG. Left-sided pacer size AICD remains in place. Telemetry leads overlie the chest.  IMPRESSION: Cardiomegaly with vascular congestion and interstitial pulmonary edema.  Retrocardiac collapse/consolidation.  Small bilateral pleural effusions.   Electronically Signed   By: Misty Stanley M.D.   On: 09/21/2013  10:09    1000:  Pt improved after ASA, SL ntg and IV morphine. Dx and testing d/w pt and family.  Questions answered.  Verb understanding, agreeable to admit.  T/C to Cardiology, case discussed, including:  HPI, pertinent PM/SHx, VS/PE, dx testing, ED course and treatment:  Agreeable to come to ED for evaluation.      Alfonzo Feller, DO 09/23/13 1351

## 2013-09-21 NOTE — ED Notes (Signed)
Results of troponin given to Dr. Thurnell Garbe

## 2013-09-21 NOTE — Progress Notes (Signed)
Troponin-3.20  Dr. Burt Knack  aware with no order given. No chest pain presented. Continue to monitor.

## 2013-09-21 NOTE — H&P (Addendum)
History and Physical  Patient ID: Thomas [name] MRN: [id number], SOB: 1934-12-31 79 y.o. Date of Encounter: 09/21/2013, 11:48 AM  Primary Physician: [name], MD Primary Cardiologist: Dr Sallyanne Kuster  Chief Complaint: Chest pain  HPI: 79 y.o. male w/ PMHx significant for CAD s/p CABG who presented to [facility] on 09/21/2013 with complaints of Chest Pain.  The patient is deaf, and his history is obtained with help of a translator who communicated with sign language.  The patient complains of about 3 hours of chest discomfort that he describes as a pressure radiating to his neck. This occurred this morning. He did some heavy lifting at work yesterday and his wife thinks this may be related. This feels similar to previous cardiac-related pain he's had in the past. He has not had any recent chest pain until today. He denies associated dyspnea, diaphoresis, orthopnea, PND, or leg swelling. He is feeling better since receiving treatment in the emergency department. He is chest pain-free at present. He has no other complaints today.  The patient has extensive cardiac history. He underwent CABG in 1986 in [place]. He has had multiple cardiac catheterizations and PCI procedures. Her last heart catheterization in 2013 demonstrated severe native vessel and graft disease. The vein graft to the RCA distribution is chronically occluded. The vein graft to OM was patent, and the LIMA to LAD was patent, but the distal anastomotic site was not well visualized. There is severe stenosis noted in the diagonal branch of the LAD. At that time, the patient was not having active chest pain and medical therapy was recommended for treatment of his coronary artery disease. Because of persistent severe LV dysfunction, the patient underwent biventricular pacing last year. He has noted improvement in his functional capacity since that time.   Past Medical History  Diagnosis Date  . Coronary artery disease   .  Chronic atrial fibrillation   . History of bleeding peptic ulcer 1980  . History of abdominal aortic aneurysm   . History of epididymitis 2013  . Testicular swelling   . Hydronephrosis with ureteropelvic junction obstruction   . Hydroureter on left 2009  . Status post coronary artery bypass grafting 1986    LIMA to the LAD, SVG to OM, SVG to RCA  . Presence of permanent cardiac pacemaker 03/24/2005    Implantation of a [place], serial number [id number]  . Ischemic cardiomyopathy     EF 30-35%.  . Deaf      Surgical History:  Past Surgical History  Procedure Laterality Date  . Coronary artery bypass graft  1986  . Insert / replace / remove pacemaker    . Transurethral resection of prostate      s/p  . Cardiac catheterization  12/10/2011    SVG to OM widely patent.  LIMA to LAD patent  . 2-d echocardiogram  11/20/2011    Ejection fraction 30-35% moderate concentric left ventricular hypertrophy. Left atrium is moderately dilated. Mild MR. Mild or  . Persantine myoview  05/06/2010    Post-rest ejection fraction 30%. No significant ischemia demonstrated. Compared to previous study there is no significant change.     Home Meds: Prior to Admission medications   Medication Sig Start Date End Date Taking? Authorizing Provider  carvedilol (COREG) 6.25 MG tablet Take 6.25 mg by mouth 2 (two) times daily with a meal.   Yes Historical Provider, MD  famotidine (PEPCID) 20 MG tablet Take 20 mg by mouth daily.   Yes  Historical Provider, MD  furosemide (LASIX) 40 MG tablet Take 40 mg by mouth daily.   Yes Historical Provider, MD  isosorbide mononitrate (IMDUR) 60 MG 24 hr tablet Take 90 mg by mouth daily.   Yes Historical Provider, MD  losartan-hydrochlorothiazide (HYZAAR) 100-12.5 MG per tablet Take 1 tablet by mouth daily.   Yes Historical Provider, MD  nitroGLYCERIN (NITROSTAT) 0.4 MG SL tablet Place 0.4 mg under the tongue every 5 (five) minutes as needed. For chest pain.    Yes Historical Provider, MD  simvastatin (ZOCOR) 20 MG tablet Take 20 mg by mouth every evening.   Yes Historical Provider, MD  vitamin B-12 (CYANOCOBALAMIN) 500 MCG tablet Take 500 mcg by mouth daily at 12 noon.   Yes Historical Provider, MD  warfarin (COUMADIN) 4 MG tablet Take 2-4 mg by mouth daily. 4 mg on Monday and Friday, 2 mg on Tuesday, Wednesday, Thursday, Saturday, and Sunday.   Yes Historical Provider, MD    Allergies: No Known Allergies  History   Social History  . Marital Status: Married    Spouse Name: N/A    Number of Children: N/A  . Years of Education: N/A   Occupational History  . Not on file.   Social History Main Topics  . Smoking status: Former Smoker    Quit date: 03/15/1985  . Smokeless tobacco: Not on file  . Alcohol Use: Yes     Comment: occas.  . Drug Use: No  . Sexual Activity: Not Currently   Other Topics Concern  . Not on file   Social History Narrative  . No narrative on file    Family history: Negative for premature coronary artery disease  Review of Systems: General: negative for chills, fever, night sweats or weight changes.  ENT: negative for rhinorrhea or epistaxis, positive for deafness Cardiovascular: See history of present illness Dermatological: negative for rash Respiratory: negative for cough or wheezing GI: negative for nausea, vomiting, diarrhea, bright red blood per rectum, melena, or hematemesis GU: no hematuria, urgency, or frequency Neurologic: negative for visual changes, syncope, headache, or dizziness Heme: no easy bruising or bleeding Endo: negative for excessive thirst, thyroid disorder, or flushing Musculoskeletal: negative for joint pain or swelling, negative for myalgias All other systems reviewed and are otherwise negative except as noted above.  Physical Exam: Blood pressure 142/86, pulse 65, temperature 97.5 F (36.4 C), temperature source Oral, resp. rate 24, SpO2 95.00%. General: Well developed, well  nourished, alert and oriented, in no acute distress, overweight HEENT: Normocephalic, atraumatic, sclera non-icteric, no xanthomas, nares are without discharge.  Neck: Supple. Carotids 2+ without bruits. JVP normal Lungs: Clear bilaterally to auscultation without wheezes, rales, or rhonchi. Breathing is unlabored. Heart: RRR with normal S1 and S2. There is a grade 3/6 holosystolic apical murmur Abdomen: Soft, non-tender, non-distended with normoactive bowel sounds. No hepatomegaly. No rebound/guarding. No obvious abdominal masses. Back: No CVA tenderness Msk:  Strength and tone appear normal for age. Extremities: No clubbing, cyanosis, or edema.  Distal pedal pulses are 2+ and equal bilaterally. Neuro: CNII-XII intact, moves all extremities spontaneously. Psych:  Responds to questions appropriately with a normal affect.   Labs:   Lab Results  Component Value Date   WBC 7.6 09/21/2013   HGB 14.0 09/21/2013   HCT 41.3 09/21/2013   MCV 92.0 09/21/2013   PLT 161 09/21/2013    Recent Labs Lab 09/21/13 0918  NA 142  K 4.6  CL 106  CO2 22  BUN 26*  CREATININE  1.24  CALCIUM 9.7  GLUCOSE 101*   No results found for this basename: CKTOTAL, CKMB, TROPONINI,  in the last 72 hours No results found for this basename: CHOL, HDL, LDLCALC, TRIG   No results found for this basename: DDIMER    Radiology/Studies:  Dg Chest Portable 1 View  09/21/2013   CLINICAL DATA:  Chest pain  EXAM: PORTABLE CHEST - 1 VIEW  COMPARISON:  12/17/2012  FINDINGS: Pulmonary vascular congestion noted with interstitial pulmonary edema. Probable small bilateral pleural effusions associated. Retrocardiac collapse/ consolidation is noted. The cardio pericardial silhouette is enlarged. Patient is status post CABG. Left-sided pacer size AICD remains in place. Telemetry leads overlie the chest.  IMPRESSION: Cardiomegaly with vascular congestion and interstitial pulmonary edema.  Retrocardiac collapse/consolidation.  Small bilateral  pleural effusions.   Electronically Signed   By: Misty Stanley M.D.   On: 09/21/2013 10:09     EKG: Ventricular paced rhythm  Cardiac Cath 2013: Angiographic Findings:  1. The left main coronary artery is free of significant atherosclerosis and is a very large vessel thatbifurcates in the usual fashion into the left anterior descending artery and left circumflex coronary artery.  2. The left anterior descending artery is severely diseased in its proximal portion, moderately calcified and is totally occluded following the first diagonal and first septal arteries. The distal LAD artery fills via the left internal mammary artery bypass graft. Sequential 70% to 95% stenoses are seen in the LAD and the ostium of the first diagonal artery. The diagonal artery is a moderate-sized vessel. There is some aneurysmal poststenotic dilatation in the diagonal artery.  3. The left circumflex coronary artery is a large-size vessel non- dominant vessel that generates one major oblique marginal artery, after which it is totally occluded. There is evidence of extensive luminal irregularities and moderate to severe calcification. At least 3 sequential hemodynamically meaningful stenoses are seen in the trunk of the left circumflex coronary artery and first oblique marginal artery, varying in severity between 80 and 95%.  4. The right coronary artery is totally occluded at the ostium. During injection of the LIMA bypass, mediocre collaterals from the distal LAD artery are seen filling the posterior descending artery.  5. The left ventricle was not injected There is no aortic valve stenosis by pullback. The left ventricular end-diastolic pressure is 19 mm Hg.  6. The saphenous vein graft bypass the oblique marginal artery is a widely patent large and healthy-appearing conduit. It fills a large branching second oblique marginal vessel. There is retrograde flow to a small and diffusely diseased third oblique marginal artery,  with TIMI 2 flow. This latter vessel was healthier and had better-flow at the previous cardiac catheterization was only moderate in size  7. The left internal mammary artery bypass to the LAD artery was difficult to selectively cannulate. It is widely patent and free of significant stenoses for a most of its course. It is a very tortuous vessel. The anastomotic site of the LAD artery was not well visualized but there is prompt contrast filling the entire length of the distal LAD artery. No obvious severe stenoses are seen   IMPRESSIONS:  Severe native and graft coronary artery disease. There are very limited options for revascularization. The only possible target is the first margo branch of the LAD artery but this would be a challenging intervention and unlikely to have a very significant impact on overall left ventricular performance  RECOMMENDATION:  We'll review the angiograms with interventional cardiology. Suspect medical management  will be the best option. Restart warfarin in 48 hours. Recheck renal function in a week. Followup in the clinic in 2 weeks. May have to discuss ICD therapy.         ASSESSMENT AND PLAN:  1. Non-ST elevation MI. 2. known CAD status post CABG 3. Paroxysmal atrial fibrillation on chronic anticoagulation 4. Chronic systolic heart failure with severe LV dysfunction  The patient's symptoms and mildly elevated troponin are consistent with non-ST elevation infarction. It has been many years since his last cardiac catheterization. He will be treated with IV heparin. I'm going to load him with Plavix 600 mg today. Tentatively plan on cardiac catheterization and possible PCI tomorrow. I have reviewed the specific risks including but not limited to vascular injury, stroke, acute kidney injury, myocardial infarction, and death. He understands all these risks occur at very low frequency (less than 1%) and agrees to proceed. If he has recurrent chest pain, we may need to proceed  more urgently. Will need to keep in mind his indication for long-term anticoagulation when deciding on treating his coronary artery disease. I am going to repeat his echocardiogram since he has undergone biventricular pacing since his last echo study.  Signed, Sherren Mocha MD 09/21/2013, 11:48 AM

## 2013-09-21 NOTE — ED Notes (Signed)
Pt in with wife c/o chest pain, neck pain and left arm pain since this morning, no distress noted, pt is deaf, interpreter has been paged.

## 2013-09-21 NOTE — ED Notes (Signed)
Pt on monitor, continuous pulse oximetry and blood pressure cuff; wife at bedside; awaiting sign language interpreter

## 2013-09-21 NOTE — Progress Notes (Signed)
ANTICOAGULATION CONSULT NOTE - Follow Up Consult  Pharmacy Consult for heparin Indication: chest pain/ACS, afib  No Known Allergies  Patient Measurements: Height: 6\' 2"  (188 cm) Weight: 233 lb 0.4 oz (105.7 kg) IBW/kg (Calculated) : 82.2 Heparin dosing wt: 104kg  Vital Signs: Temp: 98.1 F (36.7 C) (07/09 1900) Temp src: Oral (07/09 1900) BP: 128/66 mmHg (07/09 1900) Pulse Rate: 59 (07/09 1900)  Labs:  Recent Labs  09/21/13 0918 09/21/13 0944 09/21/13 1305 09/21/13 1900  HGB 14.0  --   --   --   HCT 41.3  --   --   --   PLT 161  --   --   --   LABPROT  --  20.5*  --   --   INR  --  1.76*  --   --   HEPARINUNFRC  --   --   --  0.23*  CREATININE 1.24  --   --   --   TROPONINI  --   --  3.20* 4.87*    Estimated Creatinine Clearance: 65.7 ml/min (by C-G formula based on Cr of 1.24).   Medications:  Scheduled:  . aspirin  324 mg Oral NOW   Or  . aspirin  300 mg Rectal NOW  . [START ON 09/22/2013] aspirin EC  81 mg Oral Daily  . carvedilol  6.25 mg Oral BID WC  . [START ON 09/22/2013] clopidogrel  75 mg Oral Pre-Cath  . famotidine  20 mg Oral Daily  . [START ON 09/22/2013] furosemide  40 mg Oral Daily  . [START ON 09/22/2013] losartan  100 mg Oral Daily   And  . [START ON 09/22/2013] hydrochlorothiazide  12.5 mg Oral Daily  . [START ON 09/22/2013] isosorbide mononitrate  90 mg Oral Daily  . simvastatin  20 mg Oral QPM  . sodium chloride  3 mL Intravenous Q12H  . sodium chloride  3 mL Intravenous Q12H   Infusions:  . [START ON 09/22/2013] sodium chloride    . heparin 1,350 Units/hr (09/21/13 1430)    Assessment: 80 yo male with CP on heparin for r/o ACS at 1350 units/hr and the initial heparin level is below goal (HL= 0.23). INR noted at 1.76 (on coumadin PTA for afib)  Goal of Therapy:  Heparin level 0.3-0.7 units/ml Monitor platelets by anticoagulation protocol: Yes   Plan:  -Increase heparin to 1550 units/hr -Heparin level in am with CBC  Hildred Laser,  Pharm D 09/21/2013 8:25 PM

## 2013-09-21 NOTE — Progress Notes (Signed)
ANTICOAGULATION CONSULT NOTE - Initial Consult  Pharmacy Consult for Heparin Indication: chest pain/ACS, Afib  No Known Allergies  Patient Measurements:   Heparin Dosing Weight:  106 kg  Vital Signs: Temp: 97.5 F (36.4 C) (07/09 0915) Temp src: Oral (07/09 0915) BP: 136/70 mmHg (07/09 1249) Pulse Rate: 78 (07/09 1249)  Labs:  Recent Labs  09/21/13 0918 09/21/13 0944  HGB 14.0  --   HCT 41.3  --   PLT 161  --   LABPROT  --  20.5*  INR  --  1.76*  CREATININE 1.24  --     The CrCl is unknown because both a height and weight (above a minimum accepted value) are required for this calculation.   Medical History: Past Medical History  Diagnosis Date  . Coronary artery disease   . Chronic atrial fibrillation   . History of bleeding peptic ulcer 1980  . History of abdominal aortic aneurysm   . History of epididymitis 2013  . Testicular swelling   . Hydronephrosis with ureteropelvic junction obstruction   . Hydroureter on left 2009  . Status post coronary artery bypass grafting 1986    LIMA to the LAD, SVG to OM, SVG to RCA  . Presence of permanent cardiac pacemaker 03/24/2005    Implantation of a [place], serial number [id number]  . Ischemic cardiomyopathy     EF 30-35%.  . Deaf     Medications:  See med rec  Assessment: 79 y/o M woke up and went to work with CP, pain in both sides of his neck and arms. Patient has significant h/o CAD with CABG 1986, multiple caths/PCI, CAF, PPM, ICM. Patient is on chronic Coumadin. Patient is deaf requiring sign language.  Labs: Scr 1.24, Troponin 0.21, INR 1.76, CBC WNL   Goal of Therapy:  Heparin level 0.3-0.7 units/ml Monitor platelets by anticoagulation protocol: Yes   Plan:  F/u more current weight. IV heparin (no bolus due to elevated INR) at 1350 units/hr Check heparin level in 6 hrs and daily.  Lenus Trauger S. Alford Highland, PharmD, BCPS Clinical Staff Pharmacist Pager [place], [place] 09/21/2013,1:18 PM

## 2013-09-21 NOTE — Telephone Encounter (Signed)
Thomas [name] is calling because her  husband is not feeling well . Wants to come in because they think something is wrong with his thyroid area. Please Call   Thanks

## 2013-09-21 NOTE — Telephone Encounter (Signed)
Returned call to patient's wife spoke to translator patient woke up this morning with pain in both sides of neck and chest pain.Stated patient just not feeling well.Stated went on to work with the pain.Stated patient just came back home from work.Patient continues to have chest pain,pain both sides of neck,pain in both arms. Rates pain #5.Advised to go to [facility] ER.Trish called and will need to get a translator for sign language.

## 2013-09-21 NOTE — ED Notes (Signed)
Pt brought back to room with wife in tow; pt used the bathroom and provided an urine specimen; pt now getting undressed and into a gown

## 2013-09-21 NOTE — ED Notes (Signed)
Attempted to call report to floor; floor to call back

## 2013-09-21 NOTE — ED Notes (Signed)
Cardiology at bedside.

## 2013-09-22 DIAGNOSIS — I369 Nonrheumatic tricuspid valve disorder, unspecified: Secondary | ICD-10-CM

## 2013-09-22 LAB — BASIC METABOLIC PANEL
Anion gap: 16 — ABNORMAL HIGH (ref 5–15)
BUN: 24 mg/dL — AB (ref 6–23)
CHLORIDE: 103 meq/L (ref 96–112)
CO2: 22 meq/L (ref 19–32)
CREATININE: 1.29 mg/dL (ref 0.50–1.35)
Calcium: 9.2 mg/dL (ref 8.4–10.5)
GFR calc non Af Amer: 52 mL/min — ABNORMAL LOW (ref >90)
GFR, EST AFRICAN AMERICAN: 60 mL/min — AB (ref >90)
Glucose, Bld: 103 mg/dL — ABNORMAL HIGH (ref 70–99)
Potassium: 4.1 mEq/L (ref 3.7–5.3)
Sodium: 141 mEq/L (ref 137–147)

## 2013-09-22 LAB — HEPARIN LEVEL (UNFRACTIONATED)
Heparin Unfractionated: 0.53 IU/mL (ref 0.30–0.70)
Heparin Unfractionated: 0.57 IU/mL (ref 0.30–0.70)

## 2013-09-22 LAB — CBC
HCT: 39.3 % (ref 39.0–52.0)
Hemoglobin: 13.3 g/dL (ref 13.0–17.0)
MCH: 30.9 pg (ref 26.0–34.0)
MCHC: 33.8 g/dL (ref 30.0–36.0)
MCV: 91.4 fL (ref 78.0–100.0)
Platelets: 147 10*3/uL — ABNORMAL LOW (ref 150–400)
RBC: 4.3 MIL/uL (ref 4.22–5.81)
RDW: 14.2 % (ref 11.5–15.5)
WBC: 6.8 10*3/uL (ref 4.0–10.5)

## 2013-09-22 LAB — TROPONIN I: Troponin I: 3.1 ng/mL (ref <0.30)

## 2013-09-22 LAB — LIPID PANEL
CHOLESTEROL: 112 mg/dL (ref 0–200)
HDL: 32 mg/dL — AB (ref >39)
LDL Cholesterol: 61 mg/dL (ref 0–99)
TRIGLYCERIDES: 93 mg/dL (ref <150)
Total CHOL/HDL Ratio: 3.5 RATIO
VLDL: 19 mg/dL (ref 0–40)

## 2013-09-22 LAB — T4, FREE: Free T4: 1.2 ng/dL (ref 0.80–1.80)

## 2013-09-22 LAB — PROTIME-INR
INR: 1.9 — AB (ref 0.00–1.49)
PROTHROMBIN TIME: 21.8 s — AB (ref 11.6–15.2)

## 2013-09-22 NOTE — Progress Notes (Signed)
ANTICOAGULATION CONSULT NOTE - Follow Up Consult  Pharmacy Consult for Heparin Indication: chest pain/ACS and atrial fibrillation  No Known Allergies  Patient Measurements: Height: 6\' 2"  (188 cm) Weight: 234 lb 9.1 oz (106.4 kg) IBW/kg (Calculated) : 82.2 Heparin Dosing Weight: 106 kg  Vital Signs: Temp: 97.9 F (36.6 C) (07/10 1150) Temp src: Oral (07/10 1150) BP: 103/59 mmHg (07/10 1150) Pulse Rate: 62 (07/10 1150)  Labs:  Recent Labs  09/21/13 0918 09/21/13 0944 09/21/13 1305 09/21/13 1900 09/22/13 0125 09/22/13 1115  HGB 14.0  --   --   --  13.3  --   HCT 41.3  --   --   --  39.3  --   PLT 161  --   --   --  147*  --   LABPROT  --  20.5*  --   --  21.8*  --   INR  --  1.76*  --   --  1.90*  --   HEPARINUNFRC  --   --   --  0.23* 0.53 0.57  CREATININE 1.24  --   --   --  1.29  --   TROPONINI  --   --  3.20* 4.87* 3.10*  --     Estimated Creatinine Clearance: 63.3 ml/min (by C-G formula based on Cr of 1.29).  Assessment:   Heparin level remains therapeutic (0.57) on 1550 units/hr.  Coumadin on hold, last dose at home on 7/8.   INR 1.76->1.91.  Cardiac cath postponed until Monday (7/13). Platelet count 161->147.   Goal of Therapy:  Heparin level 0.3-0.7 units/ml Monitor platelets by anticoagulation protocol: Yes   Plan:   Continue heparin drip at 1550 units/hr.  Daily heparin level, CBC and PT/INR; next in am.  Arty Baumgartner, [place] Pager: [phone] 09/22/2013,12:26 PM

## 2013-09-22 NOTE — Progress Notes (Signed)
    Subjective:  History obtained with assistance of interpreter Denies CP or dyspnea   Objective:  Filed Vitals:   09/22/13 0400 09/22/13 0500 09/22/13 0600 09/22/13 0801  BP: 102/43  139/67 141/73  Pulse: 60  59 59  Temp:    98.7 F (37.1 C)  TempSrc:    Oral  Resp: 20  24 26   Height:      Weight:  234 lb 9.1 oz (106.4 kg)    SpO2: 94%  97% 96%    Intake/Output from previous day:  Intake/Output Summary (Last 24 hours) at 09/22/13 0848 Last data filed at 09/22/13 0700  Gross per 24 hour  Intake 941.82 ml  Output    750 ml  Net 191.82 ml    Physical Exam: Physical exam: Well-developed well-nourished in no acute distress.  Skin is warm and dry.  HEENT is normal.  Neck is supple.  Chest is clear to auscultation with normal expansion.  Cardiovascular exam is regular rate and rhythm. 3/6 systolic murmur apex. Abdominal exam nontender or distended. No masses palpated. Extremities show no edema. neuro grossly intact    Lab Results: Basic Metabolic Panel:  Recent Labs  09/21/13 0918 09/22/13 0125  NA 142 141  K 4.6 4.1  CL 106 103  CO2 22 22  GLUCOSE 101* 103*  BUN 26* 24*  CREATININE 1.24 1.29  CALCIUM 9.7 9.2   CBC:  Recent Labs  09/21/13 0918 09/22/13 0125  WBC 7.6 6.8  HGB 14.0 13.3  HCT 41.3 39.3  MCV 92.0 91.4  PLT 161 147*   Cardiac Enzymes:  Recent Labs  09/21/13 1305 09/21/13 1900 09/22/13 0125  TROPONINI 3.20* 4.87* 3.10*     Assessment/Plan:  1 status post non-ST elevation myocardial infarction-the patient has ruled in. He is presently pain-free. His INR is 1.9. Delay cardiac catheterization until Monday. Continue IV heparin. Continue aspirin, Plavix, beta blocker, ARB and statin. 2 ischemic cardiomyopathy-continue beta blocker and ARB. Significant mitral regurgitation murmur on examination. Repeat echocardiogram. 3 status post biventricular pacemaker 4 atrial fibrillation-continue IV heparin and beta blocker. Resume Coumadin  following catheterization/revascularization.  Kirk Ruths 09/22/2013, 8:48 AM

## 2013-09-22 NOTE — Progress Notes (Signed)
ANTICOAGULATION CONSULT NOTE - Follow Up Consult  Pharmacy Consult for Heparin  Indication: chest pain/ACS, afib  No Known Allergies  Patient Measurements: Height: 6\' 2"  (188 cm) Weight: 234 lb 9.1 oz (106.4 kg) IBW/kg (Calculated) : 82.2  Vital Signs: Temp: 98.3 F (36.8 C) (07/09 2300) Temp src: Oral (07/10 0300) BP: 139/67 mmHg (07/10 0600) Pulse Rate: 59 (07/10 0600)  Labs:  Recent Labs  09/21/13 0918 09/21/13 0944 09/21/13 1305 09/21/13 1900 09/22/13 0125  HGB 14.0  --   --   --  13.3  HCT 41.3  --   --   --  39.3  PLT 161  --   --   --  147*  LABPROT  --  20.5*  --   --  21.8*  INR  --  1.76*  --   --  1.90*  HEPARINUNFRC  --   --   --  0.23* 0.53  CREATININE 1.24  --   --   --  1.29  TROPONINI  --   --  3.20* 4.87* 3.10*    Estimated Creatinine Clearance: 63.3 ml/min (by C-G formula based on Cr of 1.29).   Medications:  Heparin 1550 units/hr  Assessment: 79 y/o M on heparin for CP/afib, HL is 0.53 (drawn early), INR 1.9, other labs as above.   Goal of Therapy:  Heparin level 0.3-0.7 units/ml Monitor platelets by anticoagulation protocol: Yes   Plan:  -Continue heparin at 1550 units/hr -1000 HL -Daily CBC/HL -Monitor for bleeding  Narda Bonds 09/22/2013,7:00 AM

## 2013-09-22 NOTE — Progress Notes (Signed)
Pt. communicate through sign language. Sign language interpreter available at bedside.

## 2013-09-22 NOTE — Progress Notes (Signed)
Echocardiogram 2D Echocardiogram has been performed.  Thomas [name] 09/22/2013, 11:16 AM

## 2013-09-23 DIAGNOSIS — I2589 Other forms of chronic ischemic heart disease: Secondary | ICD-10-CM

## 2013-09-23 DIAGNOSIS — I5022 Chronic systolic (congestive) heart failure: Secondary | ICD-10-CM

## 2013-09-23 DIAGNOSIS — I4891 Unspecified atrial fibrillation: Secondary | ICD-10-CM

## 2013-09-23 DIAGNOSIS — Z95810 Presence of automatic (implantable) cardiac defibrillator: Secondary | ICD-10-CM

## 2013-09-23 DIAGNOSIS — I2510 Atherosclerotic heart disease of native coronary artery without angina pectoris: Secondary | ICD-10-CM

## 2013-09-23 DIAGNOSIS — I209 Angina pectoris, unspecified: Secondary | ICD-10-CM

## 2013-09-23 LAB — BASIC METABOLIC PANEL
Anion gap: 15 (ref 5–15)
BUN: 25 mg/dL — AB (ref 6–23)
CHLORIDE: 101 meq/L (ref 96–112)
CO2: 24 meq/L (ref 19–32)
Calcium: 9.3 mg/dL (ref 8.4–10.5)
Creatinine, Ser: 1.42 mg/dL — ABNORMAL HIGH (ref 0.50–1.35)
GFR calc Af Amer: 54 mL/min — ABNORMAL LOW (ref >90)
GFR calc non Af Amer: 46 mL/min — ABNORMAL LOW (ref >90)
GLUCOSE: 112 mg/dL — AB (ref 70–99)
Potassium: 4.2 mEq/L (ref 3.7–5.3)
Sodium: 140 mEq/L (ref 137–147)

## 2013-09-23 LAB — HEPARIN LEVEL (UNFRACTIONATED)
HEPARIN UNFRACTIONATED: 0.72 [IU]/mL — AB (ref 0.30–0.70)
Heparin Unfractionated: 0.73 IU/mL — ABNORMAL HIGH (ref 0.30–0.70)

## 2013-09-23 LAB — CBC
HEMATOCRIT: 40.2 % (ref 39.0–52.0)
HEMOGLOBIN: 13.5 g/dL (ref 13.0–17.0)
MCH: 30.8 pg (ref 26.0–34.0)
MCHC: 33.6 g/dL (ref 30.0–36.0)
MCV: 91.8 fL (ref 78.0–100.0)
Platelets: 157 10*3/uL (ref 150–400)
RBC: 4.38 MIL/uL (ref 4.22–5.81)
RDW: 14.1 % (ref 11.5–15.5)
WBC: 7.9 10*3/uL (ref 4.0–10.5)

## 2013-09-23 LAB — PROTIME-INR
INR: 1.71 — AB (ref 0.00–1.49)
Prothrombin Time: 20.1 seconds — ABNORMAL HIGH (ref 11.6–15.2)

## 2013-09-23 NOTE — Progress Notes (Signed)
ANTICOAGULATION CONSULT NOTE - Follow Up Consult  Pharmacy Consult for Heparin  Indication: chest pain/ACS, afib  No Known Allergies  Patient Measurements: Height: 6\' 2"  (188 cm) Weight: 234 lb 9.1 oz (106.4 kg) IBW/kg (Calculated) : 82.2  Vital Signs: Temp: 97.8 F (36.6 C) (07/10 2305) Temp src: Oral (07/10 2305) BP: 124/59 mmHg (07/11 0200) Pulse Rate: 62 (07/11 0200)  Labs:  Recent Labs  09/21/13 0918 09/21/13 0944 09/21/13 1305  09/21/13 1900 09/22/13 0125 09/22/13 1115 09/23/13 0233  HGB 14.0  --   --   --   --  13.3  --  13.5  HCT 41.3  --   --   --   --  39.3  --  40.2  PLT 161  --   --   --   --  147*  --  157  LABPROT  --  20.5*  --   --   --  21.8*  --  20.1*  INR  --  1.76*  --   --   --  1.90*  --  1.71*  HEPARINUNFRC  --   --   --   < > 0.23* 0.53 0.57 0.73*  CREATININE 1.24  --   --   --   --  1.29  --  1.42*  TROPONINI  --   --  3.20*  --  4.87* 3.10*  --   --   < > = values in this interval not displayed.  Estimated Creatinine Clearance: 57.5 ml/min (by C-G formula based on Cr of 1.42).   Medications:  Heparin 1550 units/hr  Assessment: 79 y/o M on heparin for CP/afib, HL is 0.73, INR 1.71, other labs as above.   Goal of Therapy:  Heparin level 0.3-0.7 units/ml Monitor platelets by anticoagulation protocol: Yes   Plan:  -Decrease heparin to 1450 units/hr -1200 HL -Daily CBC/HL -Monitor for bleeding  Narda Bonds 09/23/2013,4:17 AM

## 2013-09-23 NOTE — Progress Notes (Signed)
    Subjective:  Examined with sign-language interpreter. His chest pain has improved. I informed him that his EF has decreased to 45-50%.  We are still planning heart catheterization on Monday.  Objective:  Filed Vitals:   09/23/13 0600 09/23/13 0800 09/23/13 0900 09/23/13 1251  BP: 98/47 130/65    Pulse: 59 58    Temp:   98 F (36.7 C) 97.7 F (36.5 C)  TempSrc:   Oral Oral  Resp: 20 14    Height:      Weight:      SpO2: 94% 94%      Intake/Output from previous day:  Intake/Output Summary (Last 24 hours) at 09/23/13 1440 Last data filed at 09/23/13 1200  Gross per 24 hour  Intake 650.68 ml  Output   2200 ml  Net -1549.32 ml    Physical Exam: Physical exam: Well-developed well-nourished in no acute distress.  Skin is warm and dry.  HEENT is normal.  Neck is supple.  Chest is clear to auscultation with normal expansion.  Cardiovascular exam is regular rate and rhythm. 3/6 systolic murmur apex. Abdominal exam nontender or distended. No masses palpated. Extremities show no edema. neuro grossly intact   Lab Results: Basic Metabolic Panel:  Recent Labs  09/22/13 0125 09/23/13 0233  NA 141 140  K 4.1 4.2  CL 103 101  CO2 22 24  GLUCOSE 103* 112*  BUN 24* 25*  CREATININE 1.29 1.42*  CALCIUM 9.2 9.3   CBC:  Recent Labs  09/22/13 0125 09/23/13 0233  WBC 6.8 7.9  HGB 13.3 13.5  HCT 39.3 40.2  MCV 91.4 91.8  PLT 147* 157   Cardiac Enzymes:  Recent Labs  09/21/13 1305 09/21/13 1900 09/22/13 0125  TROPONINI 3.20* 4.87* 3.10*     Assessment/Plan:  1 status post non-ST elevation myocardial infarction-the patient has ruled in. History of 3 vessel CABG in 1986. He is presently pain-free. His INR is 1.9. Delay cardiac catheterization until Monday. Continue IV heparin. Continue aspirin, Plavix, beta blocker, ARB and statin. 2 ischemic cardiomyopathy-continue beta blocker and ARB. Significant mitral regurgitation murmur on examination. Repeat  echocardiogram. 3 status post biventricular pacemaker 4 atrial fibrillation-continue IV heparin and beta blocker. Resume Coumadin following catheterization/revascularization.  Pixie Casino, MD, [facility] Attending Cardiologist [facility]  Aniello Christopoulos C 09/23/2013, 2:40 PM

## 2013-09-23 NOTE — Progress Notes (Signed)
ANTICOAGULATION CONSULT NOTE - Follow Up Consult  Pharmacy Consult for Heparin Indication: chest pain/ACS and atrial fibrillation  No Known Allergies  Patient Measurements: Height: 6\' 2"  (188 cm) Weight: 231 lb 11.3 oz (105.1 kg) IBW/kg (Calculated) : 82.2 Heparin Dosing Weight: 105 kg  Vital Signs: Temp: 97.7 F (36.5 C) (07/11 1251) Temp src: Oral (07/11 1251) BP: 130/65 mmHg (07/11 0800) Pulse Rate: 58 (07/11 0800)  Labs:  Recent Labs  09/21/13 0918 09/21/13 0944 09/21/13 1305  09/21/13 1900 09/22/13 0125 09/22/13 1115 09/23/13 0233 09/23/13 1119  HGB 14.0  --   --   --   --  13.3  --  13.5  --   HCT 41.3  --   --   --   --  39.3  --  40.2  --   PLT 161  --   --   --   --  147*  --  157  --   LABPROT  --  20.5*  --   --   --  21.8*  --  20.1*  --   INR  --  1.76*  --   --   --  1.90*  --  1.71*  --   HEPARINUNFRC  --   --   --   < > 0.23* 0.53 0.57 0.73* 0.72*  CREATININE 1.24  --   --   --   --  1.29  --  1.42*  --   TROPONINI  --   --  3.20*  --  4.87* 3.10*  --   --   --   < > = values in this interval not displayed.  Estimated Creatinine Clearance: 57.2 ml/min (by C-G formula based on Cr of 1.42).  Assessment:   Heparin level remains slightly supra-therapeutic (0.72) after decrease from 1550 to 1450 units/hr early this am.  Coumadin on hold, last dose at home on 7/8.   INR 1.76->1.91>1.71.  Cardiac cath postponed until Monday (7/13). Platelet count 161->147->157.  Goal of Therapy:  Heparin level 0.3-0.7 units/ml Monitor platelets by anticoagulation protocol: Yes   Plan:   Decrease heparin drip to 1350 units/hr.  Daily heparin level, CBC and PT/INR; next in am.  Arty Baumgartner, [place] Pager: [phone] 09/23/2013,2:06 PM

## 2013-09-24 LAB — CBC
HCT: 41.4 % (ref 39.0–52.0)
Hemoglobin: 13.9 g/dL (ref 13.0–17.0)
MCH: 30.9 pg (ref 26.0–34.0)
MCHC: 33.6 g/dL (ref 30.0–36.0)
MCV: 92 fL (ref 78.0–100.0)
PLATELETS: 159 10*3/uL (ref 150–400)
RBC: 4.5 MIL/uL (ref 4.22–5.81)
RDW: 14.3 % (ref 11.5–15.5)
WBC: 7.7 10*3/uL (ref 4.0–10.5)

## 2013-09-24 LAB — HEPARIN LEVEL (UNFRACTIONATED): HEPARIN UNFRACTIONATED: 0.43 [IU]/mL (ref 0.30–0.70)

## 2013-09-24 MED ORDER — ASPIRIN 81 MG PO CHEW: 81.0000 mg | CHEWABLE_TABLET | ORAL | Status: DC

## 2013-09-24 MED ORDER — SODIUM CHLORIDE 0.9 % IV SOLN
INTRAVENOUS | Status: DC
  Administered 2013-09-25: 01:00:00 via INTRAVENOUS

## 2013-09-24 MED ORDER — SODIUM CHLORIDE 0.9 % IJ SOLN
3.0000 mL | Freq: Two times a day (BID) | INTRAMUSCULAR | Status: DC
  Administered 2013-09-24: 3 mL via INTRAVENOUS

## 2013-09-24 MED ORDER — SODIUM CHLORIDE 0.9 % IJ SOLN: 3.0000 mL | INTRAMUSCULAR | Status: DC | PRN

## 2013-09-24 MED ORDER — SODIUM CHLORIDE 0.9 % IV SOLN: 250.0000 mL | INTRAVENOUS | Status: DC | PRN

## 2013-09-24 NOTE — Progress Notes (Addendum)
    Subjective:  Examined with sign-language interpreter. Feels even better today. I suspect he lost a bypass graft and completed his infarct. We are still planning heart catheterization tomorrow at 0900 with a sign-language interpreter.  Objective:  Filed Vitals:   09/23/13 1927 09/23/13 2307 09/24/13 0331 09/24/13 0830  BP: 95/49 111/62 119/62 130/58  Pulse: 61 58  60  Temp: 98 F (36.7 C) 97.9 F (36.6 C) 98 F (36.7 C) 98 F (36.7 C)  TempSrc: Oral Oral Oral Oral  Resp: 18 20 18 19   Height:      Weight:   223 lb 12.3 oz (101.5 kg)   SpO2: 94% 94% 93% 95%    Intake/Output from previous day:  Intake/Output Summary (Last 24 hours) at 09/24/13 1131 Last data filed at 09/24/13 1000  Gross per 24 hour  Intake 1593.5 ml  Output   2476 ml  Net -882.5 ml    Physical Exam: Physical exam: Well-developed well-nourished in no acute distress.  Skin is warm and dry.  HEENT is normal.  Neck is supple.  Chest is clear to auscultation with normal expansion.  Cardiovascular exam is regular rate and rhythm. 3/6 systolic murmur apex. Abdominal exam nontender or distended. No masses palpated. Extremities show no edema. neuro grossly intact   Lab Results: Basic Metabolic Panel:  Recent Labs  09/22/13 0125 09/23/13 0233  NA 141 140  K 4.1 4.2  CL 103 101  CO2 22 24  GLUCOSE 103* 112*  BUN 24* 25*  CREATININE 1.29 1.42*  CALCIUM 9.2 9.3   CBC:  Recent Labs  09/23/13 0233 09/24/13 0500  WBC 7.9 7.7  HGB 13.5 13.9  HCT 40.2 41.4  MCV 91.8 92.0  PLT 157 159   Cardiac Enzymes:  Recent Labs  09/21/13 1305 09/21/13 1900 09/22/13 0125  TROPONINI 3.20* 4.87* 3.10*     Assessment/Plan:  1 status post non-ST elevation myocardial infarction-the patient has ruled in. History of 3 vessel CABG in 1986. He is presently pain-free. His INR is 1.9. Delay cardiac catheterization until Monday. Continue IV heparin. Continue aspirin, Plavix, beta blocker, ARB and statin. 2  ischemic cardiomyopathy-continue beta blocker and ARB. Significant mitral regurgitation murmur on examination. Repeat echocardiogram. 3 status post biventricular pacemaker 4 atrial fibrillation-continue IV heparin and beta blocker. Resume Coumadin following catheterization/revascularization. 5. Acute renal failure - creatinine is up today. Discontinue lasix. Will get hydration for cath.  Pixie Casino, MD, [facility] Attending Cardiologist [facility]  HILTY,Kenneth C 09/24/2013, 11:31 AM

## 2013-09-24 NOTE — Progress Notes (Signed)
Report called to 2W RN. Will be transporting pt with RN via wheelchair, o2 and Iv.   VS stable bp 125/59 hr 62 vpaced. Family notified as well about transfer.  Saunders Revel T

## 2013-09-24 NOTE — Progress Notes (Signed)
ANTICOAGULATION CONSULT NOTE - Follow Up Consult  Pharmacy Consult for Heparin Indication: chest pain/ACS and atrial fibrillation  No Known Allergies  Patient Measurements: Height: 6\' 2"  (188 cm) Weight: 223 lb 12.3 oz (101.5 kg) IBW/kg (Calculated) : 82.2 Heparin Dosing Weight: 101 kg  Vital Signs: Temp: 98.2 F (36.8 C) (07/12 1210) Temp src: Oral (07/12 1210) BP: 108/60 mmHg (07/12 1210) Pulse Rate: 61 (07/12 1210)  Labs:  Recent Labs  09/21/13 1900  09/22/13 0125  09/23/13 0233 09/23/13 1119 09/24/13 0500  HGB  --   < > 13.3  --  13.5  --  13.9  HCT  --   --  39.3  --  40.2  --  41.4  PLT  --   --  147*  --  157  --  159  LABPROT  --   --  21.8*  --  20.1*  --   --   INR  --   --  1.90*  --  1.71*  --   --   HEPARINUNFRC 0.23*  --  0.53  < > 0.73* 0.72* 0.43  CREATININE  --   --  1.29  --  1.42*  --   --   TROPONINI 4.87*  --  3.10*  --   --   --   --   < > = values in this interval not displayed.  Estimated Creatinine Clearance: 56.3 ml/min (by C-G formula based on Cr of 1.42).  Assessment:   Heparin level therapeutic (0.43) on 1350 units/hr.  Coumadin on hold, last dose at home on 7/8.   INR 1.71 yesterday. Platelet count 159, stable. For cardiac cath in am, delayed from 7/10 since INR was up. Creatinine trended up, Lasix stopped today (given); also on Losartan/HCTZ.  Plavix load given 7/9, then 75 mg on 7/10, but not on daily Plavix.  Goal of Therapy:  Heparin level 0.3-0.7 units/ml Monitor platelets by anticoagulation protocol: Yes   Plan:   Decrease heparin drip to 1350 units/hr.  Daily heparin level and CBC while on heparin.  Daily PT/INR.  Bmet in am.  Arty Baumgartner, [place] Pager: [phone] 09/24/2013,1:10 PM

## 2013-09-24 NOTE — Progress Notes (Signed)
Md notified about pt being able to transfer to tele. Vs stable no c/o cp.  Per Md ok to transfer. Saunders Revel T

## 2013-09-25 ENCOUNTER — Encounter (HOSPITAL_COMMUNITY)
Admission: EM | Disposition: A | Discharge status: Home / Self Care | Payer: Medicare Other | Source: Home / Self Care | Attending: Cardiology

## 2013-09-25 DIAGNOSIS — T82897A Other specified complication of cardiac prosthetic devices, implants and grafts, initial encounter: Secondary | ICD-10-CM | POA: Diagnosis not present

## 2013-09-25 DIAGNOSIS — I25810 Atherosclerosis of coronary artery bypass graft(s) without angina pectoris: Secondary | ICD-10-CM

## 2013-09-25 DIAGNOSIS — R079 Chest pain, unspecified: Secondary | ICD-10-CM | POA: Diagnosis not present

## 2013-09-25 DIAGNOSIS — I214 Non-ST elevation (NSTEMI) myocardial infarction: Secondary | ICD-10-CM

## 2013-09-25 HISTORY — PX: LEFT HEART CATHETERIZATION WITH CORONARY/GRAFT ANGIOGRAM: SHX5450

## 2013-09-25 LAB — PROTIME-INR
INR: 1.25 (ref 0.00–1.49)
Prothrombin Time: 15.7 seconds — ABNORMAL HIGH (ref 11.6–15.2)

## 2013-09-25 LAB — CBC
HCT: 41.4 % (ref 39.0–52.0)
HEMOGLOBIN: 14 g/dL (ref 13.0–17.0)
MCH: 31.1 pg (ref 26.0–34.0)
MCHC: 33.8 g/dL (ref 30.0–36.0)
MCV: 92 fL (ref 78.0–100.0)
Platelets: 153 10*3/uL (ref 150–400)
RBC: 4.5 MIL/uL (ref 4.22–5.81)
RDW: 14.3 % (ref 11.5–15.5)
WBC: 8.2 10*3/uL (ref 4.0–10.5)

## 2013-09-25 LAB — POCT ACTIVATED CLOTTING TIME: Activated Clotting Time: 551 seconds

## 2013-09-25 LAB — BASIC METABOLIC PANEL
Anion gap: 15 (ref 5–15)
BUN: 29 mg/dL — ABNORMAL HIGH (ref 6–23)
CO2: 23 meq/L (ref 19–32)
Calcium: 9.2 mg/dL (ref 8.4–10.5)
Chloride: 100 mEq/L (ref 96–112)
Creatinine, Ser: 1.46 mg/dL — ABNORMAL HIGH (ref 0.50–1.35)
GFR calc Af Amer: 52 mL/min — ABNORMAL LOW (ref >90)
GFR, EST NON AFRICAN AMERICAN: 45 mL/min — AB (ref >90)
GLUCOSE: 113 mg/dL — AB (ref 70–99)
POTASSIUM: 4.4 meq/L (ref 3.7–5.3)
Sodium: 138 mEq/L (ref 137–147)

## 2013-09-25 LAB — HEPARIN LEVEL (UNFRACTIONATED): HEPARIN UNFRACTIONATED: 0.24 [IU]/mL — AB (ref 0.30–0.70)

## 2013-09-25 SURGERY — LEFT HEART CATHETERIZATION WITH CORONARY/GRAFT ANGIOGRAM
Anesthesia: LOCAL

## 2013-09-25 MED ORDER — WARFARIN - PHARMACIST DOSING INPATIENT: Freq: Every day | Status: DC

## 2013-09-25 MED ORDER — SODIUM CHLORIDE 0.9 % IV SOLN: 250.0000 mL | INTRAVENOUS | Status: DC | PRN

## 2013-09-25 MED ORDER — SODIUM CHLORIDE 0.9 % IV SOLN: 1.0000 mL/kg/h | INTRAVENOUS | Status: AC

## 2013-09-25 MED ORDER — VERAPAMIL HCL 2.5 MG/ML IV SOLN
INTRAVENOUS | Status: AC
  Filled 2013-09-25: qty 2

## 2013-09-25 MED ORDER — NITROGLYCERIN 0.2 MG/ML ON CALL CATH LAB
INTRAVENOUS | Status: AC
  Filled 2013-09-25: qty 1

## 2013-09-25 MED ORDER — CLOPIDOGREL BISULFATE 75 MG PO TABS
75.0000 mg | ORAL_TABLET | Freq: Every day | ORAL | Status: DC
  Administered 2013-09-25 – 2013-09-26 (×2): 75 mg via ORAL
  Filled 2013-09-25: qty 1

## 2013-09-25 MED ORDER — BIVALIRUDIN 250 MG IV SOLR
INTRAVENOUS | Status: AC
  Filled 2013-09-25: qty 250

## 2013-09-25 MED ORDER — MIDAZOLAM HCL 2 MG/2ML IJ SOLN
INTRAMUSCULAR | Status: AC
  Filled 2013-09-25: qty 2

## 2013-09-25 MED ORDER — CLOPIDOGREL BISULFATE 300 MG PO TABS
ORAL_TABLET | ORAL | Status: AC
  Filled 2013-09-25: qty 1

## 2013-09-25 MED ORDER — LIDOCAINE HCL (PF) 1 % IJ SOLN
INTRAMUSCULAR | Status: AC
  Filled 2013-09-25: qty 30

## 2013-09-25 MED ORDER — WARFARIN SODIUM 4 MG PO TABS
4.0000 mg | ORAL_TABLET | Freq: Once | ORAL | Status: AC
  Administered 2013-09-25: 4 mg via ORAL
  Filled 2013-09-25: qty 1

## 2013-09-25 MED ORDER — HEPARIN (PORCINE) IN NACL 2-0.9 UNIT/ML-% IJ SOLN
INTRAMUSCULAR | Status: AC
  Filled 2013-09-25: qty 1500

## 2013-09-25 MED ORDER — SODIUM CHLORIDE 0.9 % IJ SOLN: 3.0000 mL | Freq: Two times a day (BID) | INTRAMUSCULAR | Status: DC

## 2013-09-25 MED ORDER — SODIUM CHLORIDE 0.9 % IJ SOLN: 3.0000 mL | INTRAMUSCULAR | Status: DC | PRN

## 2013-09-25 MED ORDER — HEPARIN SODIUM (PORCINE) 1000 UNIT/ML IJ SOLN
INTRAMUSCULAR | Status: AC
  Filled 2013-09-25: qty 1

## 2013-09-25 MED ORDER — FENTANYL CITRATE 0.05 MG/ML IJ SOLN
INTRAMUSCULAR | Status: AC
  Filled 2013-09-25: qty 2

## 2013-09-25 NOTE — Progress Notes (Signed)
Spoke with sign interpreting service to confirm meeting at 0900 today for heart cath. Pt resting with call bell within reach.  Will continue to monitor. Payton Emerald, RN

## 2013-09-25 NOTE — CV Procedure (Signed)
Cardiac Catheterization Procedure Note  Name: Thomas [name] MRN: [id number] DOB: 1934-04-22  Procedure: Selective Coronary Angiography, LIMA angiography, SVG angiography, PTCA and stenting of the SVG-OM  Indication: NSTEMI  Procedural Details:  The left wrist was prepped, draped, and anesthetized with 1% lidocaine. Using the modified Seldinger technique, a 5/6 French sheath was introduced into the right radial artery. 3 mg of verapamil was administered through the sheath, weight-based unfractionated heparin was administered intravenously. Standard Judkins catheters were used for selective coronary angiography and left ventriculography. Catheter manipulation was difficult because of left subclavian artery tortuosity. I was only able to visualize the saphenous vein graft obtuse marginal with an LCB guide catheter. I tried multiple diagnostic catheters are successfully including the LIMA, JR 4, LCB, AL-1, and AL-2. Catheter exchanges were performed over an exchange length guidewire.  PROCEDURAL FINDINGS Hemodynamics: AO 120/62 with a mean of 83   Coronary angiography: Coronary dominance: right  Left mainstem: The left main is large in caliber. It is mildly diseased and moderately calcified without significant obstruction.  Left anterior descending (LAD): The LAD is severely calcified. The vessel is totally occluded at the first diagonal branch. This is unchanged from the previous study. There is 80-90% stenosis and to the diagonal which is also a severely diseased and calcified vessel not suitable for PCI.  Left circumflex (LCx): The left circumflex is critically disease. The vessel has sequential 95% stenoses in its proximal portion and is then totally occluded in the midportion.  Right coronary artery (RCA): Known to be occluded, not selectively injected  LIMA to LAD: Patent without significant obstructive disease. The distal anastomotic site has 50-70% stenosis unchanged from the  previous study. The LAD in the mid and distal portion is widely patent.  Saphenous vein graft RCA: Known to be occluded, not selectively injected  Saphenous vein graft to OM: There is a 95% stenosis focally in the proximal body of the graft. Otherwise the graft is patent without significant obstruction.  Left ventriculography: Deferred  PCI Note:  Following the diagnostic procedure, the decision was made to proceed with PCI. Angiomax was used for anticoagulation. The patient has been loaded with Plavix 600 mg. Once a therapeutic ACT was achieved, a 6 French LCB guide catheter was inserted.  A cougar coronary guidewire was used to cross the lesion.  The guide catheter support was very tenuous and I was certain that a distal embolic protection device would not cross the lesion. The lesion was predilated with a 2.5 x 12 mm balloon.  The lesion was then stented with a 3.5 x 12 mm MultiLink vision bare-metal stent.  The stent was postdilated with a 3.5 mm noncompliant balloon to 16 atmospheres.  Following PCI, there was 0% residual stenosis and TIMI-3 flow. Final angiography confirmed an excellent result. The patient experienced no chest pain. The patient tolerated the procedure well. There were no immediate procedural complications. A TR band was used for radial hemostasis. The patient was transferred to the post catheterization recovery area for further monitoring.  PCI Data: Vessel - obtuse marginal 1/Segment - proximal body of saphenous vein graft Percent Stenosis (pre)  95 TIMI-flow 3 Stent 3.5 x 12 mm MultiLink vision bare-metal stent Percent Stenosis (post) 0 TIMI-flow (post) 3  Final Conclusions:   1. Severe native vessel coronary artery disease with severely calcified coronary arteries and total occlusion of the LAD, left circumflex, and right coronary arteries 2. Status post aortocoronary bypass surgery with continued patency of the LIMA to  LAD and saphenous vein graft to OM, known occlusion  of the saphenous vein graft RCA. 3. Severe stenosis of the saphenous vein graft to OM with successful PCI using a bare-metal stent   Recommendations:  The patient requires long-term warfarin. Would treat him with Plavix 75 mg daily x30 days. He should be continued on aspirin 81 mg and warfarin long term. If he remains stable, I think he could be discharged tomorrow morning and his INR can drift up as an outpatient.  Sherren Mocha 09/25/2013, 10:57 AM

## 2013-09-25 NOTE — Progress Notes (Signed)
ANTICOAGULATION CONSULT NOTE - Initial Consult  Pharmacy Consult for coumadin Indication: atrial fibrillation and severe CAD  No Known Allergies  Patient Measurements: Height: 6\' 2"  (188 cm) Weight: 233 lb 4 oz (105.8 kg) IBW/kg (Calculated) : 82.2 Heparin Dosing Weight:   Vital Signs: Temp: 97.4 F (36.3 C) (07/13 1000) Temp src: Oral (07/13 1000) BP: 124/72 mmHg (07/13 1245) Pulse Rate: 59 (07/13 1245)  Labs:  Recent Labs  09/23/13 0233 09/23/13 1119 09/24/13 0500 09/25/13 0521  HGB 13.5  --  13.9 14.0  HCT 40.2  --  41.4 41.4  PLT 157  --  159 153  LABPROT 20.1*  --   --  15.7*  INR 1.71*  --   --  1.25  HEPARINUNFRC 0.73* 0.72* 0.43 0.24*  CREATININE 1.42*  --   --  1.46*    Estimated Creatinine Clearance: 55.8 ml/min (by C-G formula based on Cr of 1.46).   Medical History: Past Medical History  Diagnosis Date  . Coronary artery disease   . Chronic atrial fibrillation   . History of bleeding peptic ulcer 1980  . History of abdominal aortic aneurysm   . History of epididymitis 2013  . Testicular swelling   . Hydronephrosis with ureteropelvic junction obstruction   . Hydroureter on left 2009  . Status post coronary artery bypass grafting 1986    LIMA to the LAD, SVG to OM, SVG to RCA  . Presence of permanent cardiac pacemaker 03/24/2005    Implantation of a Hector, serial number [id number]  . Ischemic cardiomyopathy     EF 30-35%.  . Deaf     Medications:  Scheduled:  . aspirin EC  81 mg Oral Daily  . carvedilol  6.25 mg Oral BID WC  . clopidogrel  75 mg Oral Daily  . famotidine  20 mg Oral Daily  . isosorbide mononitrate  90 mg Oral Daily  . simvastatin  20 mg Oral QPM  . sodium chloride  3 mL Intravenous Q12H   Infusions:  . sodium chloride 1 mL/kg/hr (09/25/13 1145)    Assessment: 79 yo male with afib and severe CAD will be restarted on coumadin s/p catherization. Patient was on coumadin 4mg  on Monday and Fridays and 2mg   other days prior to admission.  INR on admission was 1.71 and INR today is 1.25. Goal of Therapy:  INR 2-3 Monitor platelets by anticoagulation protocol: Yes   Plan:  1) Coumadin 4 mg po x1 2) INR in am  Jalena Vanderlinden, Tsz-Yin 09/25/2013,12:58 PM

## 2013-09-25 NOTE — Interval H&P Note (Signed)
History and Physical Interval Note:  09/25/2013 9:41 AM  Thomas [name]  has presented today for surgery, with the diagnosis of cp  The various methods of treatment have been discussed with the patient and family. After consideration of risks, benefits and other options for treatment, the patient has consented to  Procedure(s): LEFT HEART CATHETERIZATION WITH CORONARY/GRAFT ANGIOGRAM (N/A) as a surgical intervention .  The patient's history has been reviewed, patient examined, no change in status, stable for surgery.  I have reviewed the patient's chart and labs.  Questions were answered to the patient's satisfaction.    Cath Lab Visit (complete for each Cath Lab visit)  Clinical Evaluation Leading to the Procedure:   ACS: Yes.    Non-ACS:    Anginal Classification: CCS IV  Anti-ischemic medical therapy: Maximal Therapy (2 or more classes of medications)  Non-Invasive Test Results: No non-invasive testing performed  Prior CABG: Previous CABG       Sherren Mocha

## 2013-09-25 NOTE — Progress Notes (Signed)
Thanks, Mike.

## 2013-09-25 NOTE — H&P (View-Only) (Signed)
    Subjective:  Examined with sign-language interpreter. Feels even better today. I suspect he lost a bypass graft and completed his infarct. We are still planning heart catheterization tomorrow at 0900 with a sign-language interpreter.  Objective:  Filed Vitals:   09/23/13 1927 09/23/13 2307 09/24/13 0331 09/24/13 0830  BP: 95/49 111/62 119/62 130/58  Pulse: 61 58  60  Temp: 98 F (36.7 C) 97.9 F (36.6 C) 98 F (36.7 C) 98 F (36.7 C)  TempSrc: Oral Oral Oral Oral  Resp: 18 20 18 19   Height:      Weight:   223 lb 12.3 oz (101.5 kg)   SpO2: 94% 94% 93% 95%    Intake/Output from previous day:  Intake/Output Summary (Last 24 hours) at 09/24/13 1131 Last data filed at 09/24/13 1000  Gross per 24 hour  Intake 1593.5 ml  Output   2476 ml  Net -882.5 ml    Physical Exam: Physical exam: Well-developed well-nourished in no acute distress.  Skin is warm and dry.  HEENT is normal.  Neck is supple.  Chest is clear to auscultation with normal expansion.  Cardiovascular exam is regular rate and rhythm. 3/6 systolic murmur apex. Abdominal exam nontender or distended. No masses palpated. Extremities show no edema. neuro grossly intact   Lab Results: Basic Metabolic Panel:  Recent Labs  09/22/13 0125 09/23/13 0233  NA 141 140  K 4.1 4.2  CL 103 101  CO2 22 24  GLUCOSE 103* 112*  BUN 24* 25*  CREATININE 1.29 1.42*  CALCIUM 9.2 9.3   CBC:  Recent Labs  09/23/13 0233 09/24/13 0500  WBC 7.9 7.7  HGB 13.5 13.9  HCT 40.2 41.4  MCV 91.8 92.0  PLT 157 159   Cardiac Enzymes:  Recent Labs  09/21/13 1305 09/21/13 1900 09/22/13 0125  TROPONINI 3.20* 4.87* 3.10*     Assessment/Plan:  1 status post non-ST elevation myocardial infarction-the patient has ruled in. History of 3 vessel CABG in 1986. He is presently pain-free. His INR is 1.9. Delay cardiac catheterization until Monday. Continue IV heparin. Continue aspirin, Plavix, beta blocker, ARB and statin. 2  ischemic cardiomyopathy-continue beta blocker and ARB. Significant mitral regurgitation murmur on examination. Repeat echocardiogram. 3 status post biventricular pacemaker 4 atrial fibrillation-continue IV heparin and beta blocker. Resume Coumadin following catheterization/revascularization. 5. Acute renal failure - creatinine is up today. Discontinue lasix. Will get hydration for cath.  Pixie Casino, MD, [facility] Attending Cardiologist [facility]  HILTY,Kenneth C 09/24/2013, 11:31 AM

## 2013-09-26 ENCOUNTER — Telehealth: Payer: Self-pay | Admitting: Cardiology

## 2013-09-26 ENCOUNTER — Encounter (HOSPITAL_COMMUNITY): Payer: Self-pay | Admitting: Physician Assistant

## 2013-09-26 DIAGNOSIS — I10 Essential (primary) hypertension: Secondary | ICD-10-CM

## 2013-09-26 DIAGNOSIS — I25810 Atherosclerosis of coronary artery bypass graft(s) without angina pectoris: Secondary | ICD-10-CM

## 2013-09-26 LAB — CBC
HEMATOCRIT: 41 % (ref 39.0–52.0)
HEMOGLOBIN: 13.8 g/dL (ref 13.0–17.0)
MCH: 31.2 pg (ref 26.0–34.0)
MCHC: 33.7 g/dL (ref 30.0–36.0)
MCV: 92.6 fL (ref 78.0–100.0)
Platelets: 153 10*3/uL (ref 150–400)
RBC: 4.43 MIL/uL (ref 4.22–5.81)
RDW: 14.4 % (ref 11.5–15.5)
WBC: 7.4 10*3/uL (ref 4.0–10.5)

## 2013-09-26 LAB — BASIC METABOLIC PANEL
ANION GAP: 16 — AB (ref 5–15)
BUN: 24 mg/dL — AB (ref 6–23)
CHLORIDE: 101 meq/L (ref 96–112)
CO2: 22 meq/L (ref 19–32)
Calcium: 9.4 mg/dL (ref 8.4–10.5)
Creatinine, Ser: 1.25 mg/dL (ref 0.50–1.35)
GFR calc non Af Amer: 54 mL/min — ABNORMAL LOW (ref >90)
GFR, EST AFRICAN AMERICAN: 63 mL/min — AB (ref >90)
Glucose, Bld: 110 mg/dL — ABNORMAL HIGH (ref 70–99)
POTASSIUM: 4.3 meq/L (ref 3.7–5.3)
Sodium: 139 mEq/L (ref 137–147)

## 2013-09-26 LAB — PROTIME-INR
INR: 1.2 (ref 0.00–1.49)
Prothrombin Time: 15.2 seconds (ref 11.6–15.2)

## 2013-09-26 MED ORDER — ASPIRIN 81 MG PO TBEC: 81.0000 mg | DELAYED_RELEASE_TABLET | Freq: Every day | ORAL | Status: DC

## 2013-09-26 MED ORDER — LOSARTAN POTASSIUM-HCTZ 50-12.5 MG PO TABS: 1.0000 | ORAL_TABLET | Freq: Every day | ORAL | Status: DC

## 2013-09-26 MED ORDER — LOSARTAN POTASSIUM 50 MG PO TABS
50.0000 mg | ORAL_TABLET | Freq: Every day | ORAL | Status: DC
  Administered 2013-09-26: 10:00:00 50 mg via ORAL
  Filled 2013-09-26: qty 1

## 2013-09-26 MED ORDER — FUROSEMIDE 40 MG PO TABS: 40.0000 mg | ORAL_TABLET | Freq: Every day | ORAL | Status: DC

## 2013-09-26 MED ORDER — WARFARIN SODIUM 4 MG PO TABS
4.0000 mg | ORAL_TABLET | Freq: Once | ORAL | Status: DC
  Filled 2013-09-26: qty 1

## 2013-09-26 MED ORDER — CLOPIDOGREL BISULFATE 75 MG PO TABS: 75.0000 mg | ORAL_TABLET | Freq: Every day | ORAL | Status: DC

## 2013-09-26 MED ORDER — HYDROCHLOROTHIAZIDE 12.5 MG PO CAPS
12.5000 mg | ORAL_CAPSULE | Freq: Every day | ORAL | Status: DC
  Administered 2013-09-26: 12.5 mg via ORAL
  Filled 2013-09-26: qty 1

## 2013-09-26 MED FILL — Sodium Chloride IV Soln 0.9%: INTRAVENOUS | Qty: 50 | Status: AC

## 2013-09-26 NOTE — Discharge Summary (Signed)
Discharge Summary   [name] ID: Thomas [name] MRN: [id number], DOB/AGE: 77-Aug-1936 77 y.o. Admit date: 09/21/2013 D/C date:     09/26/2013  Primary Care Provider: [name], MD Primary Cardiologist: Thomas [name]  Primary Discharge Diagnoses:  1. CAD/NSTEMI - prior CABG 1986, multiple PCIs, s/p PTCA/BMS to SVG-OM this admission 2. ICM s/p BiV-ICD - EF 45-50% this admission 3. Paroxysmal atrial fibrillation, on Coumadin 4. Renal insufficiency, suspect CKD stage III 5. HTN 6. Deaf  7. Moderate TR  Secondary Discharge Diagnoses:  1. H/o bleeding ulcer 1980 2. H/o AAA 3. H/o epididymitis 4. H/o Hydronephrosis with ureteropelvic junction obstruction  5. Hydroureter on left  Hospital Course: Thomas [name] is a 79 y/o deaf man with history of CAD s/p CABG 1986 in [place] and multiple PCIs, ICM s/p BiV-ICD, PAF, HTN who presented to [facility] with chest pressure similar to prior angina. Thomas [name] denied associated dyspnea, diaphoresis, orthopnea, PND, or leg swelling. Thomas [name] ruled in for a NSTEMI with troponin peak of 4.87. EKG showed Ventricular paced rhythm. Coumadin was held in prep for cath. Thomas [name] was observed over Thomas weekend with plan for cath on Monday. 2D echo 09/22/13 showed EF 45-50%, cannot exclude RWMA, trivial AI, mod TR. Due to bump in Cr (with prior likely CKD stage III), Lasix and HCTZ were held pre-cath. Thomas [name] was hydrated in prep for cath. Thomas [name] underwent cath 09/25/13 showing 1. Severe native vessel coronary artery disease with severely calcified coronary arteries and total occlusion of Thomas LAD, left circumflex, and right coronary arteries  2. Status post aortocoronary bypass surgery with continued patency of Thomas LIMA to LAD and saphenous vein graft to OM, known occlusion of Thomas saphenous vein graft RCA.  3. Severe stenosis of Thomas saphenous vein graft to OM with successful PCI using a bare-metal stent  Thomas plan is for Plavix 75mg  daily x 30 days; continue aspirin and warfarin indefinitely. Dr. Burt [name] felt  Thomas [name] did not require bridging. This morning Thomas [name] is feeling well. Cr is stable post-cath. Dr. Ellyn [name] has seen and examined Thomas [name] today and feels Thomas [name] is stable for discharge. Per Dr. Ellyn [name], will resume Lasix at home dose, resume ARB at 1/2 dose, resume HCTZ at home dose. Thomas [name] reports that Thomas [name] has INR check on 10/02/13 at Dr. Barbarann [name] office. Thomas [name] was advised to notify doctor immediately if Thomas [name] develops any unusual bleeding.  Discharge Vitals: Blood pressure 141/81, pulse 65, temperature 98.3 F (36.8 C), temperature source Oral, resp. rate 16, height 6\' 2"  (1.88 m), weight 234 lb 2.1 oz (106.2 kg), SpO2 93.00%. General: Well developed, well nourished WM, in no acute distress. Head: Normocephalic, atraumatic, sclera non-icteric, no xanthomas, nares are without discharge. Neck: JVP not elevated. Lungs: Clear bilaterally to auscultation without wheezes, rales, or rhonchi. Breathing is unlabored. Heart: RRR S1 S2 without murmurs, rubs, or gallops.  Abdomen: Soft, non-tender, non-distended with normoactive bowel sounds. No rebound/guarding. Extremities: No clubbing or cyanosis. No edema. Distal pedal pulses are 2+ and equal bilaterally. R radial site with mild ecchymosis, no hematoma, good pulse in tact Neuro: Alert and oriented X 3. Moves all extremities spontaneously. commincates via sign language interpreter. Psych:  Responds to questions appropriately with a normal affect.  Labs: Lab Results  Component Value Date   WBC 7.4 09/26/2013   HGB 13.8 09/26/2013   HCT 41.0 09/26/2013   MCV 92.6 09/26/2013   PLT 153 09/26/2013     Recent Labs Lab 09/26/13 0409  NA 139  K 4.3  CL  101  CO2 22  BUN 24*  CREATININE 1.25  CALCIUM 9.4  GLUCOSE 110*    Lab Results  Component Value Date   CHOL 112 09/22/2013   HDL 32* 09/22/2013   LDLCALC 61 09/22/2013   TRIG 93 09/22/2013   No results found for this basename: DDIMER    Diagnostic Studies/Procedures   Dg Chest Portable 1 View 09/21/2013    CLINICAL DATA:  Chest pain  EXAM: PORTABLE CHEST - 1 VIEW  COMPARISON:  12/17/2012  FINDINGS: Pulmonary vascular congestion noted with interstitial pulmonary edema. Probable small bilateral pleural effusions associated. Retrocardiac collapse/ consolidation is noted. Thomas cardio pericardial silhouette is enlarged. [name]. Left-sided pacer size AICD remains in place. Telemetry leads overlie Thomas chest.  IMPRESSION: Cardiomegaly with vascular congestion and interstitial pulmonary edema.  Retrocardiac collapse/consolidation.  Small bilateral pleural effusions.   Electronically Signed   By: Thomas [name] M.D.   On: 09/21/2013 10:09   2D Echo 09/22/13 - Left ventricle: Thomas cavity size was mildly dilated. Systolic function was mildly reduced. Thomas estimated ejection fraction was in Thomas range of 45% to 50%. Regional wall motion abnormalities cannot be excluded. - Ventricular septum: Septal motion showed paradox. - Aortic valve: There was trivial regurgitation. - Right atrium: Thomas atrium was moderately dilated. - Tricuspid valve: There was moderate regurgitation.   Cath 09/25/13 Cardiac Catheterization Procedure Note  Name: Thomas [name]  MRN: [id number]  DOB: 1935-02-04  Procedure: Selective Coronary Angiography, LIMA angiography, SVG angiography, PTCA and stenting of Thomas SVG-OM  Indication: NSTEMI  Procedural Details: Thomas left wrist was prepped, draped, and anesthetized with 1% lidocaine. Using Thomas modified Seldinger technique, a 5/6 French sheath was introduced into Thomas right radial artery. 3 mg of verapamil was administered through Thomas sheath, weight-based unfractionated heparin was administered intravenously. Standard Judkins catheters were used for selective coronary angiography and left ventriculography. Catheter manipulation was difficult because of left subclavian artery tortuosity. I was only able to visualize Thomas saphenous vein graft obtuse marginal with an LCB guide catheter. I  tried multiple diagnostic catheters are successfully including Thomas LIMA, JR 4, LCB, AL-1, and AL-2. Catheter exchanges were performed over an exchange length guidewire.  PROCEDURAL FINDINGS  Hemodynamics:  AO 120/62 with a mean of 83  Coronary angiography:  Coronary dominance: right  Left mainstem: Thomas left main is large in caliber. It is mildly diseased and moderately calcified without significant obstruction.  Left anterior descending (LAD): Thomas LAD is severely calcified. Thomas vessel is totally occluded at Thomas first diagonal branch. This is unchanged from Thomas previous study. There is 80-90% stenosis and to Thomas diagonal which is also a severely diseased and calcified vessel not suitable for PCI.  Left circumflex (LCx): Thomas left circumflex is critically disease. Thomas vessel has sequential 95% stenoses in its proximal portion and is then totally occluded in Thomas midportion.  Right coronary artery (RCA): Known to be occluded, not selectively injected  LIMA to LAD: Patent without significant obstructive disease. Thomas distal anastomotic site has 50-70% stenosis unchanged from Thomas previous study. Thomas LAD in Thomas mid and distal portion is widely patent.  Saphenous vein graft RCA: Known to be occluded, not selectively injected  Saphenous vein graft to OM: There is a 95% stenosis focally in Thomas proximal body of Thomas graft. Otherwise Thomas graft is patent without significant obstruction.  Left ventriculography: Deferred  PCI Note: Following Thomas diagnostic procedure, Thomas decision was made to proceed with PCI. Angiomax was used for  anticoagulation. Thomas [name] has been loaded with Plavix 600 mg. Once a therapeutic ACT was achieved, a 6 French LCB guide catheter was inserted. A cougar coronary guidewire was used to cross Thomas lesion. Thomas guide catheter support was very tenuous and I was certain that a distal embolic protection device would not cross Thomas lesion. Thomas lesion was predilated with a 2.5 x 12 mm balloon. Thomas  lesion was then stented with a 3.5 x 12 mm MultiLink vision bare-metal stent. Thomas stent was postdilated with a 3.5 mm noncompliant balloon to 16 atmospheres. Following PCI, there was 0% residual stenosis and TIMI-3 flow. Final angiography confirmed an excellent result. Thomas [name] experienced no chest pain. Thomas [name] tolerated Thomas procedure well. There were no immediate procedural complications. A TR band was used for radial hemostasis. Thomas [name] was transferred to Thomas post catheterization recovery area for further monitoring.  PCI Data:  Vessel - obtuse marginal 1/Segment - proximal body of saphenous vein graft  Percent Stenosis (pre) 95  TIMI-flow 3  Stent 3.5 x 12 mm MultiLink vision bare-metal stent  Percent Stenosis (post) 0  TIMI-flow (post) 3  Final Conclusions:  1. Severe native vessel coronary artery disease with severely calcified coronary arteries and total occlusion of Thomas LAD, left circumflex, and right coronary arteries  2. Status post aortocoronary bypass surgery with continued patency of Thomas LIMA to LAD and saphenous vein graft to OM, known occlusion of Thomas saphenous vein graft RCA.  3. Severe stenosis of Thomas saphenous vein graft to OM with successful PCI using a bare-metal stent  Recommendations:  Thomas [name] requires long-term warfarin. Would treat him with Plavix 75 mg daily x30 days. Thomas [name] should be continued on aspirin 81 mg and warfarin long term. If Thomas [name] remains stable, I think Thomas [name] could be discharged tomorrow morning and his INR can drift up as an outpatient.  Sherren Mocha  09/25/2013, 10:57 AM   Discharge Medications   Current Discharge Medication List    START taking these medications   Details  aspirin EC 81 MG EC tablet Take 1 tablet (81 mg total) by mouth daily. Qty: 30 tablet, Refills: 11    clopidogrel (PLAVIX) 75 MG tablet Take 1 tablet (75 mg total) by mouth daily. Qty: 30 tablet, Refills: 0    losartan-hydrochlorothiazide (HYZAAR) 50-12.5 MG per tablet  Take 1 tablet by mouth daily. Qty: 30 tablet, Refills: 6      CONTINUE these medications which have CHANGED   Details  furosemide (LASIX) 40 MG tablet Take 1 tablet (40 mg total) by mouth daily. Resume 09/27/13.      CONTINUE these medications which have NOT CHANGED   Details  carvedilol (COREG) 6.25 MG tablet Take 6.25 mg by mouth 2 (two) times daily with a meal.    famotidine (PEPCID) 20 MG tablet Take 20 mg by mouth daily.    isosorbide mononitrate (IMDUR) 60 MG 24 hr tablet Take 90 mg by mouth daily.    nitroGLYCERIN (NITROSTAT) 0.4 MG SL tablet Place 0.4 mg under Thomas tongue every 5 (five) minutes as needed. For chest pain.    simvastatin (ZOCOR) 20 MG tablet Take 20 mg by mouth every evening.    vitamin B-12 (CYANOCOBALAMIN) 500 MCG tablet Take 500 mcg by mouth daily at 12 noon.    warfarin (COUMADIN) 4 MG tablet Take 2-4 mg by mouth daily. 4 mg on Monday and Friday, 2 mg on Tuesday, Wednesday, Thursday, Saturday, and Sunday.      STOP taking these  medications     losartan-hydrochlorothiazide (HYZAAR) 100-12.5 MG per tablet         Disposition   Thomas [name] will be discharged in stable condition to home. Discharge Instructions   Diet - low sodium heart healthy    Complete by:  As directed      Increase activity slowly    Complete by:  As directed   No driving for 1 week. No lifting over 10 lbs for 2 weeks. No sexual activity for 2 weeks. You may return to work on 10/03/13 with Thomas above restrictions. Keep procedure site clean & dry. If you notice increased pain, swelling, bleeding or pus, call/return!  You may shower, but no soaking baths/hot tubs/pools for 1 week.  Thomas plan is for you to continue: - Plavix for 30 days - Aspirin and Warfarin (Coumadin) long-term          Follow-up Information   Follow up with [facility] R, NP. (10/05/13 at 10:30am)    Specialty:  Cardiology   Contact information:   [address] [place]  [address] [phone]       Follow up with HEDRICK,JAMES, MD. (Please have your Coumadin level checked on 10/02/13 as scheduled)    Specialty:  Family Medicine   Contact information:   [address]   [place] [address] [phone]         Duration of Discharge Encounter: Greater than 30 minutes including physician and PA time.  Signed, Melina Copa PA-C 09/26/2013, 8:37 AM   I saw examined Thomas [name] this morning, but was not able to be available during Thomas sign language interpreters assisted evaluation. I discussed Thomas [name] with Melina Copa, PA-C. Thomas [name] remained totally asymptomatic, and has ambulated without any difficulty. Thomas [name] is otherwise stable for discharge. Agree with Thomas discharge summary.  Leonie Man, M.D., M.S. Interventional Cardiologist   Pager # [phone] 09/26/2013

## 2013-09-26 NOTE — Telephone Encounter (Signed)
Needs a TCM phone call on Wednesday.. [phone] Wynona Canes

## 2013-09-26 NOTE — Progress Notes (Signed)
CARDIAC REHAB PHASE I   Pt has walked independently without CP. Ed completed through interpreter. Good reception and appropriate questions. Interested in [facility] and will send referral to [facility]. [phone]  Josephina Shih [place] CES, ACSM 09/26/2013 9:18 AM

## 2013-09-26 NOTE — Progress Notes (Signed)
ANTICOAGULATION CONSULT NOTE - Follow Up Consult  Pharmacy Consult for coumadin Indication: atrial fibrillation  No Known Allergies  Patient Measurements: Height: 6\' 2"  (188 cm) Weight: 234 lb 2.1 oz (106.2 kg) IBW/kg (Calculated) : 82.2 Heparin Dosing Weight:   Vital Signs: Temp: 98.3 F (36.8 C) (07/14 0816) Temp src: Oral (07/14 0816) BP: 141/81 mmHg (07/14 0816) Pulse Rate: 65 (07/14 0816)  Labs:  Recent Labs  09/23/13 1119  09/24/13 0500 09/25/13 0521 09/26/13 0409  HGB  --   < > 13.9 14.0 13.8  HCT  --   --  41.4 41.4 41.0  PLT  --   --  159 153 153  LABPROT  --   --   --  15.7* 15.2  INR  --   --   --  1.25 1.20  HEPARINUNFRC 0.72*  --  0.43 0.24*  --   CREATININE  --   --   --  1.46* 1.25  < > = values in this interval not displayed.  Estimated Creatinine Clearance: 65.3 ml/min (by C-G formula based on Cr of 1.25).   Medications:  Scheduled:  . aspirin EC  81 mg Oral Daily  . carvedilol  6.25 mg Oral BID WC  . clopidogrel  75 mg Oral Daily  . famotidine  20 mg Oral Daily  . [START ON 09/27/2013] furosemide  40 mg Oral Daily  . hydrochlorothiazide  12.5 mg Oral Daily  . isosorbide mononitrate  90 mg Oral Daily  . losartan  50 mg Oral Daily  . simvastatin  20 mg Oral QPM  . sodium chloride  3 mL Intravenous Q12H  . Warfarin - Pharmacist Dosing Inpatient   Does not apply q1800   Infusions:    Assessment: 79 yo male with afib is currently on subthearpeutic coumadin.  INR today is 1.2 stable.  Goal of Therapy:  INR 2-3 Monitor platelets by anticoagulation protocol: Yes   Plan:  1) Coumadin 4mg  po x1 2) INR in am if not discharged  Dinara Lupu, Tsz-Yin 09/26/2013,8:29 AM

## 2013-09-27 ENCOUNTER — Telehealth: Payer: Self-pay | Admitting: Cardiology

## 2013-09-27 NOTE — Telephone Encounter (Signed)
TCM call, pt doing well, no complaints, knows he will follow up with me and he has the date.

## 2013-10-05 ENCOUNTER — Ambulatory Visit (INDEPENDENT_AMBULATORY_CARE_PROVIDER_SITE_OTHER): Payer: Medicare Other | Admitting: Cardiology

## 2013-10-05 ENCOUNTER — Encounter: Payer: Self-pay | Admitting: Cardiology

## 2013-10-05 VITALS — BP 110/66 | HR 81 | Ht 74.0 in | Wt 232.0 lb

## 2013-10-05 DIAGNOSIS — I255 Ischemic cardiomyopathy: Secondary | ICD-10-CM

## 2013-10-05 DIAGNOSIS — I2589 Other forms of chronic ischemic heart disease: Secondary | ICD-10-CM

## 2013-10-05 DIAGNOSIS — I2510 Atherosclerotic heart disease of native coronary artery without angina pectoris: Secondary | ICD-10-CM

## 2013-10-05 DIAGNOSIS — I214 Non-ST elevation (NSTEMI) myocardial infarction: Secondary | ICD-10-CM

## 2013-10-05 DIAGNOSIS — I4891 Unspecified atrial fibrillation: Secondary | ICD-10-CM

## 2013-10-05 DIAGNOSIS — I4821 Permanent atrial fibrillation: Secondary | ICD-10-CM

## 2013-10-05 DIAGNOSIS — I5022 Chronic systolic (congestive) heart failure: Secondary | ICD-10-CM

## 2013-10-05 NOTE — Progress Notes (Signed)
10/09/2013   PCP: Thomas Pink, MD   Chief Complaint  Patient presents with  . Follow-up    Post hospital:  Feels much better - no chest pain, minimal SOB,edema or dizziness.    Primary Cardiologist:Dr. Bertrum Sol   HPI:  79 year old deaf male ( his wife and his interpreter are present) presents for office visit post cath and PCI.  His history is outlined in the chart of CAD with CABG in 1986.  He has had multiple PCIs since that time and for his ICM he has BiV ICD.  EF 45-50%. He was admitted with chest pain/NSTEMI.   He underwent cath and rec'd BMS to VG-OM.1. Severe native vessel coronary artery disease with severely calcified coronary arteries and total occlusion of the LAD, left circumflex, and right coronary arteries  2. Status post aortocoronary bypass surgery with continued patency of the LIMA to LAD and saphenous vein graft to OM, known occlusion of the saphenous vein graft RCA.  3. Severe stenosis of the saphenous vein graft to OM with successful PCI using a bare-metal stent --- Plan is plavix X 30 days, ASA and coumadin continuous.    Other hx PAF on coumadin.  Moderate TR. His INR is checked at [facility].     Today he feels well no complaints, no SOB, no chest pain.  He has returned to work without problems.  Allergies  Allergen Reactions  . Phenazopyridine Nausea Only    Other Reaction: GI UPSET    Current Outpatient Prescriptions  Medication Sig Dispense Refill  . aspirin EC 81 MG EC tablet Take 1 tablet (81 mg total) by mouth daily.  30 tablet  11  . carvedilol (COREG) 6.25 MG tablet Take 6.25 mg by mouth 2 (two) times daily with a meal.      . clopidogrel (PLAVIX) 75 MG tablet Take 1 tablet (75 mg total) by mouth daily.  30 tablet  0  . famotidine (PEPCID) 20 MG tablet Take 20 mg by mouth daily.      . furosemide (LASIX) 40 MG tablet Take 1 tablet (40 mg total) by mouth daily.      . isosorbide mononitrate (IMDUR) 60 MG 24 hr tablet Take 90  mg by mouth daily.      [name] losartan-hydrochlorothiazide (HYZAAR) 50-12.5 MG per tablet Take 1 tablet by mouth daily.  30 tablet  6  . nitroGLYCERIN (NITROSTAT) 0.4 MG SL tablet Place 0.4 mg under the tongue every 5 (five) minutes as needed. For chest pain.      . simvastatin (ZOCOR) 20 MG tablet Take 20 mg by mouth every evening.      . vitamin B-12 (CYANOCOBALAMIN) 500 MCG tablet Take 500 mcg by mouth daily at 12 noon.      . warfarin (COUMADIN) 4 MG tablet Take 2-4 mg by mouth daily. 4 mg on Monday and Friday, 2 mg on Tuesday, Wednesday, Thursday, Saturday, and Sunday.       No current facility-administered medications for this visit.    Past Medical History  Diagnosis Date  . Coronary artery disease     a. s/p CABG 1986. b. Multiple PCIs/caths. c. 09/2013: s/p PTCA and BMS to SVG-OM.  [name] PAF (paroxysmal atrial fibrillation)   . History of bleeding peptic ulcer 1980  . History of abdominal aortic aneurysm   . History of epididymitis 2013  . Testicular swelling   . Hydronephrosis with ureteropelvic junction obstruction   .  Hydroureter on left 2009  . Status post coronary artery bypass grafting 1986    LIMA to the LAD, SVG to OM, SVG to RCA  . Biventricular ICD (implantable cardioverter-defibrillator) in place 03/24/2005    Implantation of a [place], serial number [id number]  . Ischemic cardiomyopathy     a. Prior EF 30-35%, s/p BIV-ICD. b. 09/2013: EF 45-50%.  . Deaf   . CKD (chronic kidney disease), stage III   . Moderate tricuspid regurgitation   . HTN (hypertension)     Past Surgical History  Procedure Laterality Date  . Coronary artery bypass graft  1986  . Insert / replace / remove pacemaker    . Transurethral resection of prostate      s/p  . Cardiac catheterization  12/10/2011    SVG to OM widely patent.  LIMA to LAD patent  . 2-d echocardiogram  11/20/2011    Ejection fraction 30-35% moderate concentric left ventricular hypertrophy. Left atrium is  moderately dilated. Mild MR. Mild or  . Persantine myoview  05/06/2010    Post-rest ejection fraction 30%. No significant ischemia demonstrated. Compared to previous study there is no significant change.    LJQ:GBEEFEO:FH colds or fevers, no weight changes Skin:no rashes or ulcers HEENT:no blurred vision, no congestion CV:see HPI PUL:see HPI GI:no diarrhea constipation or melena, no indigestion GU:no hematuria, no dysuria MS:no joint pain, no claudication Neuro:no syncope, no lightheadedness Endo:no diabetes, no thyroid disease  Wt Readings from Last 3 Encounters:  10/05/13 232 lb (105.235 kg)  09/26/13 234 lb 2.1 oz (106.2 kg)  09/26/13 234 lb 2.1 oz (106.2 kg)    PHYSICAL EXAM BP 110/66  Pulse 81  Ht 6\' 2"  (1.88 m)  Wt 232 lb (105.235 kg)  BMI 29.77 kg/m2 General:Pleasant affect, NAD Skin:Warm and dry, brisk capillary refill HEENT:normocephalic, sclera clear, mucus membranes moist Neck:supple, no JVD, no bruits  Heart:S1S2 RRR without murmur, gallup, rub or click Lungs:clear without rales, rhonchi, or wheezes QRF:XJOI, non tender, + BS, do not palpate liver spleen or masses Ext:no lower ext edema, 2+ pedal pulses, 2+ radial pulses Neuro:alert and oriented, MAE, follows commands, + facial symmetry  EKG:BiV pacing rate of 81  ASSESSMENT AND PLAN Acute myocardial infarction, subendocardial infarction, initial episode of care Occurred 09/21/13, NSTEMI  And PCI with BMS to VG-OM, plan for Plavix for 30 days, continue on asa and coumadin.  Pt will follow up with Dr. Jerilynn Mages. Croitoru in 6 weeks and plavix will be stopped then.   CAD Status post coronary artery bypass grafting: 1995 No further pain since MI  Cardiomyopathy, ischemic: Ejection fraction 30-35% Ef improved to 45-50%.  Chronic systolic heart failure euvolemic  Permanent atrial fibrillation Stable on coumadin followed by PCP

## 2013-10-05 NOTE — Patient Instructions (Signed)
Dr. Sallyanne Kuster recommends that you schedule a follow-up appointment in: 6-8 weeks.

## 2013-10-09 ENCOUNTER — Encounter: Payer: Self-pay | Admitting: Cardiology

## 2013-10-09 NOTE — Assessment & Plan Note (Signed)
Stable on coumadin followed by PCP

## 2013-10-09 NOTE — Assessment & Plan Note (Signed)
Occurred 09/21/13, NSTEMI  And PCI with BMS to VG-OM, plan for Plavix for 30 days, continue on asa and coumadin.  Pt will follow up with Dr. Jerilynn Mages. Croitoru in 6 weeks and plavix will be stopped then.

## 2013-10-09 NOTE — Assessment & Plan Note (Signed)
No further pain since MI

## 2013-10-09 NOTE — Assessment & Plan Note (Signed)
euvolemic 

## 2013-10-09 NOTE — Assessment & Plan Note (Signed)
Ef improved to 45-50%.

## 2013-10-18 ENCOUNTER — Encounter: Payer: Self-pay | Admitting: Cardiology

## 2013-10-18 ENCOUNTER — Ambulatory Visit (INDEPENDENT_AMBULATORY_CARE_PROVIDER_SITE_OTHER): Payer: Medicare Other | Admitting: Cardiology

## 2013-10-18 ENCOUNTER — Emergency Department (HOSPITAL_COMMUNITY)
Admission: EM | Admit: 2013-10-18 | Discharge: 2013-10-18 | Disposition: A | Discharge status: Home / Self Care | Payer: Medicare Other | Attending: Emergency Medicine | Admitting: Emergency Medicine

## 2013-10-18 ENCOUNTER — Ambulatory Visit (INDEPENDENT_AMBULATORY_CARE_PROVIDER_SITE_OTHER): Payer: Medicare Other | Admitting: Pharmacist Clinician (PhC)/ Clinical Pharmacy Specialist

## 2013-10-18 ENCOUNTER — Emergency Department (HOSPITAL_COMMUNITY): Payer: Medicare Other

## 2013-10-18 ENCOUNTER — Encounter (HOSPITAL_COMMUNITY): Payer: Self-pay | Admitting: Emergency Medicine

## 2013-10-18 VITALS — BP 131/83 | HR 84 | Ht 74.0 in | Wt 232.0 lb

## 2013-10-18 DIAGNOSIS — I2510 Atherosclerotic heart disease of native coronary artery without angina pectoris: Secondary | ICD-10-CM | POA: Insufficient documentation

## 2013-10-18 DIAGNOSIS — Z87448 Personal history of other diseases of urinary system: Secondary | ICD-10-CM | POA: Diagnosis not present

## 2013-10-18 DIAGNOSIS — Z95810 Presence of automatic (implantable) cardiac defibrillator: Secondary | ICD-10-CM | POA: Insufficient documentation

## 2013-10-18 DIAGNOSIS — Z7901 Long term (current) use of anticoagulants: Secondary | ICD-10-CM | POA: Insufficient documentation

## 2013-10-18 DIAGNOSIS — Z951 Presence of aortocoronary bypass graft: Secondary | ICD-10-CM | POA: Insufficient documentation

## 2013-10-18 DIAGNOSIS — I4891 Unspecified atrial fibrillation: Secondary | ICD-10-CM | POA: Insufficient documentation

## 2013-10-18 DIAGNOSIS — H9190 Unspecified hearing loss, unspecified ear: Secondary | ICD-10-CM | POA: Insufficient documentation

## 2013-10-18 DIAGNOSIS — R42 Dizziness and giddiness: Secondary | ICD-10-CM | POA: Insufficient documentation

## 2013-10-18 DIAGNOSIS — Z79899 Other long term (current) drug therapy: Secondary | ICD-10-CM | POA: Insufficient documentation

## 2013-10-18 DIAGNOSIS — Z7982 Long term (current) use of aspirin: Secondary | ICD-10-CM | POA: Insufficient documentation

## 2013-10-18 DIAGNOSIS — Z87891 Personal history of nicotine dependence: Secondary | ICD-10-CM | POA: Diagnosis not present

## 2013-10-18 DIAGNOSIS — H538 Other visual disturbances: Secondary | ICD-10-CM | POA: Diagnosis not present

## 2013-10-18 DIAGNOSIS — N183 Chronic kidney disease, stage 3 unspecified: Secondary | ICD-10-CM | POA: Insufficient documentation

## 2013-10-18 DIAGNOSIS — Z7902 Long term (current) use of antithrombotics/antiplatelets: Secondary | ICD-10-CM | POA: Diagnosis not present

## 2013-10-18 DIAGNOSIS — I129 Hypertensive chronic kidney disease with stage 1 through stage 4 chronic kidney disease, or unspecified chronic kidney disease: Secondary | ICD-10-CM | POA: Insufficient documentation

## 2013-10-18 DIAGNOSIS — Z8711 Personal history of peptic ulcer disease: Secondary | ICD-10-CM | POA: Insufficient documentation

## 2013-10-18 DIAGNOSIS — I25709 Atherosclerosis of coronary artery bypass graft(s), unspecified, with unspecified angina pectoris: Secondary | ICD-10-CM

## 2013-10-18 DIAGNOSIS — R29818 Other symptoms and signs involving the nervous system: Secondary | ICD-10-CM

## 2013-10-18 DIAGNOSIS — I209 Angina pectoris, unspecified: Secondary | ICD-10-CM

## 2013-10-18 DIAGNOSIS — I25810 Atherosclerosis of coronary artery bypass graft(s) without angina pectoris: Secondary | ICD-10-CM

## 2013-10-18 DIAGNOSIS — R2990 Unspecified symptoms and signs involving the nervous system: Secondary | ICD-10-CM

## 2013-10-18 LAB — CBC WITH DIFFERENTIAL/PLATELET
BASOS ABS: 0 10*3/uL (ref 0.0–0.1)
Basophils Relative: 0 % (ref 0–1)
Eosinophils Absolute: 0.2 10*3/uL (ref 0.0–0.7)
Eosinophils Relative: 2 % (ref 0–5)
HEMATOCRIT: 41.9 % (ref 39.0–52.0)
Hemoglobin: 14.5 g/dL (ref 13.0–17.0)
Lymphocytes Relative: 23 % (ref 12–46)
Lymphs Abs: 1.8 10*3/uL (ref 0.7–4.0)
MCH: 31.9 pg (ref 26.0–34.0)
MCHC: 34.6 g/dL (ref 30.0–36.0)
MCV: 92.1 fL (ref 78.0–100.0)
Monocytes Absolute: 0.9 10*3/uL (ref 0.1–1.0)
Monocytes Relative: 11 % (ref 3–12)
Neutro Abs: 5.1 10*3/uL (ref 1.7–7.7)
Neutrophils Relative %: 64 % (ref 43–77)
Platelets: 165 10*3/uL (ref 150–400)
RBC: 4.55 MIL/uL (ref 4.22–5.81)
RDW: 13.5 % (ref 11.5–15.5)
WBC: 8 10*3/uL (ref 4.0–10.5)

## 2013-10-18 LAB — I-STAT TROPONIN, ED
TROPONIN I, POC: 0 ng/mL (ref 0.00–0.08)
TROPONIN I, POC: 0.03 ng/mL (ref 0.00–0.08)

## 2013-10-18 LAB — COMPREHENSIVE METABOLIC PANEL
ALT: 25 U/L (ref 0–53)
AST: 27 U/L (ref 0–37)
Albumin: 4.2 g/dL (ref 3.5–5.2)
Alkaline Phosphatase: 66 U/L (ref 39–117)
Anion gap: 15 (ref 5–15)
BUN: 30 mg/dL — ABNORMAL HIGH (ref 6–23)
CALCIUM: 9.7 mg/dL (ref 8.4–10.5)
CO2: 24 mEq/L (ref 19–32)
Chloride: 102 mEq/L (ref 96–112)
Creatinine, Ser: 1.39 mg/dL — ABNORMAL HIGH (ref 0.50–1.35)
GFR calc Af Amer: 55 mL/min — ABNORMAL LOW (ref >90)
GFR calc non Af Amer: 48 mL/min — ABNORMAL LOW (ref >90)
Glucose, Bld: 88 mg/dL (ref 70–99)
Potassium: 4 mEq/L (ref 3.7–5.3)
Sodium: 141 mEq/L (ref 137–147)
Total Bilirubin: 0.8 mg/dL (ref 0.3–1.2)
Total Protein: 7.4 g/dL (ref 6.0–8.3)

## 2013-10-18 LAB — I-STAT CHEM 8, ED
BUN: 32 mg/dL — ABNORMAL HIGH (ref 6–23)
Calcium, Ion: 1.23 mmol/L (ref 1.13–1.30)
Chloride: 105 mEq/L (ref 96–112)
Creatinine, Ser: 1.5 mg/dL — ABNORMAL HIGH (ref 0.50–1.35)
Glucose, Bld: 77 mg/dL (ref 70–99)
HEMATOCRIT: 44 % (ref 39.0–52.0)
Hemoglobin: 15 g/dL (ref 13.0–17.0)
Potassium: 3.8 mEq/L (ref 3.7–5.3)
Sodium: 140 mEq/L (ref 137–147)
TCO2: 22 mmol/L (ref 0–100)

## 2013-10-18 LAB — CBG MONITORING, ED: GLUCOSE-CAPILLARY: 77 mg/dL (ref 70–99)

## 2013-10-18 LAB — POCT INR: INR: 2.2

## 2013-10-18 NOTE — ED Provider Notes (Signed)
CSN: [id number]     Arrival date & time 10/18/13  1250 History   First MD Initiated Contact with Patient 10/18/13 1556     Chief Complaint  Patient presents with  . Blurred Vision  . Dizziness      HPI  Mr. Halley is a 79 year old male with a history of paroxysmal atrial fibrillation, coronary artery disease status post PCI in July 2015, AAA, ischemic cardiomyopathy status post ICD placement, deafness, stage III chronic kidney disease, and hypertension who presents with a 5-7 minute duration episode of dizziness, flushing, and blurred vision bilaterally.  He reports that this episode happened yesterday morning. He denies any associated diplopia, hemiparesis, focal weakness, nausea, or vomiting. After the episode, he reports that he was back to his usual state of health. He is on anticoagulation for atrial fibrillation and his last INR was therapeutic at 2.2.  Past Medical History  Diagnosis Date  . Coronary artery disease     a. s/p CABG 1986. b. Multiple PCIs/caths. c. 09/2013: s/p PTCA and BMS to SVG-OM.  [name] PAF (paroxysmal atrial fibrillation)   . History of bleeding peptic ulcer 1980  . History of abdominal aortic aneurysm   . History of epididymitis 2013  . Testicular swelling   . Hydronephrosis with ureteropelvic junction obstruction   . Hydroureter on left 2009  . Status post coronary artery bypass grafting 1986    LIMA to the LAD, SVG to OM, SVG to RCA  . Biventricular ICD (implantable cardioverter-defibrillator) in place 03/24/2005    Implantation of a [place], serial number [id number]  . Ischemic cardiomyopathy     a. Prior EF 30-35%, s/p BIV-ICD. b. 09/2013: EF 45-50%.  . Deaf   . CKD (chronic kidney disease), stage III   . Moderate tricuspid regurgitation   . HTN (hypertension)    Past Surgical History  Procedure Laterality Date  . Coronary artery bypass graft  1986  . Insert / replace / remove pacemaker    . Transurethral resection of prostate       s/p  . Cardiac catheterization  12/10/2011    SVG to OM widely patent.  LIMA to LAD patent  . 2-d echocardiogram  11/20/2011    Ejection fraction 30-35% moderate concentric left ventricular hypertrophy. Left atrium is moderately dilated. Mild MR. Mild or  . Persantine myoview  05/06/2010    Post-rest ejection fraction 30%. No significant ischemia demonstrated. Compared to previous study there is no significant change.   No family history on file. History  Substance Use Topics  . Smoking status: Former Smoker    Quit date: 03/15/1985  . Smokeless tobacco: Not on file  . Alcohol Use: Yes     Comment: occas.    Review of Systems  Constitutional: Negative for fever and chills.  Eyes: Positive for visual disturbance ( Blurry vision lasting 5-7 minutes yesterday).  Respiratory: Negative for cough, chest tightness, shortness of breath and wheezing.   Cardiovascular: Negative for chest pain and palpitations.  Gastrointestinal: Negative for nausea, vomiting and diarrhea.  Genitourinary: Negative for dysuria and hematuria.  Neurological: Positive for dizziness (Lasting 5-7 minutes yesterday). Negative for tremors, syncope and numbness.      Allergies  Phenazopyridine  Home Medications   Prior to Admission medications   Medication Sig Start Date End Date Taking? Authorizing Provider  aspirin EC 81 MG EC tablet Take 1 tablet (81 mg total) by mouth daily. 09/26/13   Dayna N Dunn, PA-C  carvedilol (COREG) 6.25 MG  tablet Take 6.25 mg by mouth 2 (two) times daily with a meal.    Historical Provider, MD  clopidogrel (PLAVIX) 75 MG tablet Take 1 tablet (75 mg total) by mouth daily. 09/26/13   Dayna N Dunn, PA-C  famotidine (PEPCID) 20 MG tablet Take 20 mg by mouth daily.    Historical Provider, MD  furosemide (LASIX) 40 MG tablet Take 1 tablet (40 mg total) by mouth daily. 09/27/13   Dayna N Dunn, PA-C  isosorbide mononitrate (IMDUR) 60 MG 24 hr tablet Take 90 mg by mouth daily.    Historical  Provider, MD  losartan-hydrochlorothiazide (HYZAAR) 50-12.5 MG per tablet Take 1 tablet by mouth daily. 09/26/13   Dayna N Dunn, PA-C  nitroGLYCERIN (NITROSTAT) 0.4 MG SL tablet Place 0.4 mg under the tongue every 5 (five) minutes as needed. For chest pain.    Historical Provider, MD  simvastatin (ZOCOR) 20 MG tablet Take 20 mg by mouth every evening.    Historical Provider, MD  vitamin B-12 (CYANOCOBALAMIN) 500 MCG tablet Take 500 mcg by mouth daily at 12 noon.    Historical Provider, MD  warfarin (COUMADIN) 4 MG tablet Take 2-4 mg by mouth daily. 4 mg on Monday and Friday, 2 mg on Tuesday, Wednesday, Thursday, Saturday, and Sunday.    Historical Provider, MD   BP 143/82  Pulse 83  Temp(Src) 97.4 F (36.3 C) (Oral)  Resp 24  SpO2 91% Physical Exam  Constitutional: He is oriented to person, place, and time. He appears well-developed and well-nourished. No distress.  HENT:  Head: Normocephalic and atraumatic.  Eyes: EOM are normal. Pupils are equal, round, and reactive to light.  Cardiovascular: Normal rate and regular rhythm.  Exam reveals no gallop and no friction rub.   No murmur heard. Pulmonary/Chest: Effort normal and breath sounds normal. He has no wheezes. He has no rales.  Abdominal: Soft. Bowel sounds are normal. He exhibits no distension. There is no tenderness.  Musculoskeletal: He exhibits no edema.  Neurological: He is alert and oriented to person, place, and time. No cranial nerve deficit.  Strength: 5 out of 5 bilaterally in upper and lower extremities Sensation: Intact to light touch and throughout upper and lower bilateral extremities Reflexes: 2+ throughout Cerebellar: No dysmetria  Skin: He is not diaphoretic.    ED Course  Procedures (including critical care time) Labs Review Labs Reviewed  I-STAT CHEM 8, ED - Abnormal; Notable for the following:    BUN 32 (*)    Creatinine, Ser 1.50 (*)    All other components within normal limits  CBC WITH DIFFERENTIAL   COMPREHENSIVE METABOLIC PANEL  CBG MONITORING, ED  I-STAT TROPOININ, ED    Imaging Review No results found.   EKG Interpretation   Date/Time:  Wednesday October 18 2013 16:11:13 EDT Ventricular Rate:  75 PR Interval:    QRS Duration: 149 QT Interval:  449 QTC Calculation: 501 R Axis:   -76 Text Interpretation:  Complete AV block with wide QRS complex, paced  Left  bundle branch block No significant change since last tracing Confirmed by  YAO  MD, DAVID ([id number]) on 10/18/2013 4:23:09 PM      MDM   Final diagnoses:  None   5-7 minute duration of dizziness and blurred vision.  - CT head without contrast without evidence of acute intracranial pathology.  - Neurology favors episodes be near syncope possibly secondary to vasovagal and not TIA.  - No need for further TIA workup.  Luan Moore, MD 10/18/13 [phone]

## 2013-10-18 NOTE — ED Notes (Signed)
Pt placed into gown and placed on monitor family at bedside.

## 2013-10-18 NOTE — ED Notes (Addendum)
Patient presents today from PCP office for further evaluation of blurred vision and dizziness that has been present since yesterday. Patient believes these symptoms may be attributed to a recent change in his medications. Denies pain, denies numbness, tingling, SOB. Patient and wife are both deaf, interpreter at bedside translating.

## 2013-10-18 NOTE — ED Notes (Addendum)
Pt states no symptoms at this time; pt began 3 medications around July 14th after stent placement (losartan-HCTZ, aspirin, and Plavix) but symptoms of dizziness and blurred vision did not begin until yesterday.  Pt went to [facility] for evalutaion and was sent to this facility for possible neuro evaluation.  Pt stated he waited in lobby for 3 hours and symptoms seemed to have gone away. Interpreter at bedside

## 2013-10-18 NOTE — Progress Notes (Signed)
Patient ID: Thomas [name], male   DOB: 06-12-1934, 79 y.o.   MRN: [id number] CC: blurriness of vision and dizziness  HPI: Patient is a 79 yo deaf male (his wife and interpretor were present in the room) presenting to the office as a walk in. He is s/p cath and PCI in the middle of July. He has a history of CAD s/p CABG in 1986 and has had multiple PCIs since that time. He additionally has a BiV ICD in place.  Patient notes that yesterday he was at work and around 11 am he noted that he felt "funny". He noted that he had blurriness in both eyes at that time. He notes he felt weak as well at that time and ended up coming home after this occurred. He noted that it improved during the day, though noted this morning he had recurrence of blurriness and dizziness. Initially he noted that his blurry vision this morning was in one eye, then he stated that it was in both eyes. His dizziness is described as light headedness and a sensation of colors moving around. He notes this initially occurred when he sat down. He denies numbness and weakness. This has never occurred before. He denies chest pain and dyspnea. No syncope or headache. Notes vision was fine prior to yesterday. States he took his aspirin this morning.   Throughout the interview him and his wife perseverate on his medications being the cause of this issue. He specifically notes that there was a change in his losartan dosing. It is unclear if this was an increase or decrease in dosing per patient, but on review of D/C summary it appears that the dose was cut in half at discharge.    Past Medical History  Diagnosis Date  . Coronary artery disease     a. s/p CABG 1986. b. Multiple PCIs/caths. c. 09/2013: s/p PTCA and BMS to SVG-OM.  [name] PAF (paroxysmal atrial fibrillation)   . History of bleeding peptic ulcer 1980  . History of abdominal aortic aneurysm   . History of epididymitis 2013  . Testicular swelling   . Hydronephrosis with ureteropelvic  junction obstruction   . Hydroureter on left 2009  . Status post coronary artery bypass grafting 1986    LIMA to the LAD, SVG to OM, SVG to RCA  . Biventricular ICD (implantable cardioverter-defibrillator) in place 03/24/2005    Implantation of a [place], serial number [id number]  . Ischemic cardiomyopathy     a. Prior EF 30-35%, s/p BIV-ICD. b. 09/2013: EF 45-50%.  . Deaf   . CKD (chronic kidney disease), stage III   . Moderate tricuspid regurgitation   . HTN (hypertension)     Past Surgical History  Procedure Laterality Date  . Coronary artery bypass graft  1986  . Insert / replace / remove pacemaker    . Transurethral resection of prostate      s/p  . Cardiac catheterization  12/10/2011    SVG to OM widely patent.  LIMA to LAD patent  . 2-d echocardiogram  11/20/2011    Ejection fraction 30-35% moderate concentric left ventricular hypertrophy. Left atrium is moderately dilated. Mild MR. Mild or  . Persantine myoview  05/06/2010    Post-rest ejection fraction 30%. No significant ischemia demonstrated. Compared to previous study there is no significant change.    Current Outpatient Prescriptions  Medication Sig Dispense Refill  . aspirin EC 81 MG EC tablet Take 1 tablet (81 mg total) by mouth daily.  30 tablet  11  . carvedilol (COREG) 6.25 MG tablet Take 6.25 mg by mouth 2 (two) times daily with a meal.      . clopidogrel (PLAVIX) 75 MG tablet Take 1 tablet (75 mg total) by mouth daily.  30 tablet  0  . famotidine (PEPCID) 20 MG tablet Take 20 mg by mouth daily.      . furosemide (LASIX) 40 MG tablet Take 1 tablet (40 mg total) by mouth daily.      . isosorbide mononitrate (IMDUR) 60 MG 24 hr tablet Take 90 mg by mouth daily.      [name] losartan-hydrochlorothiazide (HYZAAR) 50-12.5 MG per tablet Take 1 tablet by mouth daily.  30 tablet  6  . nitroGLYCERIN (NITROSTAT) 0.4 MG SL tablet Place 0.4 mg under the tongue every 5 (five) minutes as needed. For chest pain.       . simvastatin (ZOCOR) 20 MG tablet Take 20 mg by mouth every evening.      . vitamin B-12 (CYANOCOBALAMIN) 500 MCG tablet Take 500 mcg by mouth daily at 12 noon.      . warfarin (COUMADIN) 4 MG tablet Take 2-4 mg by mouth daily. 4 mg on Monday and Friday, 2 mg on Tuesday, Wednesday, Thursday, Saturday, and Sunday.       No current facility-administered medications for this visit.    Allergies  Allergen Reactions  . Phenazopyridine Nausea Only    Other Reaction: GI UPSET    ROS: see HPI  BP 131/83  Pulse 84  Ht 6\' 2"  (1.88 m)  Wt 232 lb (105.235 kg)  BMI 29.77 kg/m2  PHYSICAL EXAM Gen: NAD, sitting on exam table comfortably HEENT: PERRL, MMM, poor dentition CV: RRR, 2/6 systolic murmur noted at left upper sternal border Pulm: CTAB, no wheezes or crackles GI: soft, NT, ND, midline ventral hernia noted Neuro: sensation to light touch diminished in bilateral distribution of cranial nerve V and there is a visual field deficit in left eye right lower visual field (he could not see anything in this visual field), otherwise CN 2-12 intact, 5/5 strength in bilateral biceps, triceps, grip, quads, hamstrings, plantar and dorsiflexion, sensation to light touch reported to be absent in bilateral UE and LE, normal gait, 2+ patellar reflexes, no pronator drift, romberg with unsteadiness, normal finger to nose and rapid alternating movements Ext: no edema present Skin: no rashes noted    EKG: paced ventricular rhythm  Labs: No results found for this or any previous visit (from the past 24 hour(s)).   Assessment and Plan: Patient is a 79 yo male with history of CAD s/p CABG and multiple PCIs and afib on coumadin with most recent INR being subtherapeutic. Patients history and physical exam are concerning for a neurological event, specifically TIA/CVA. The most notable deficit is his visual field deficit. He additionally has sensation deficit though this is bilateral and would be abnormal for  a neurological event. Given his risk factors for stroke and his physical exam findings we are advising that the patient be evaluated in the ED for concern for TIA/CVA.  This was discussed with the patient and his wife through the interpretor and they agreed with this plan. Please note that the patient has a ICD in place at this time. His most recent echo was done on 09/22/13. It does not appear that he has had carotid dopplers. INR checked in the office was 2.2.  Tommi Rumps, MD [place] PGY3   I evaluated  the patient along with Dr. Caryl Bis and agree with the assessment and plan as outlined above. I agree that his history and physical exam are both concerning for neurological event, specifically TIA/CVA. He has a history of paroxysmal atrial fibrillation with known recent subtherapeutic INRs. Given this, we feel that it is best that he be sent to the ER for further evaluation/workup, as risk for potential stroke is high. Both the patient and his wife are in agreement with this.  Lyda Jester, PA-C

## 2013-10-18 NOTE — Consult Note (Signed)
Neurology Consultation Reason for Consult: Dizziness Referring Physician: Darl Householder, D  CC: Dizziness  History is obtained from: Patient, wife  HPI: Thomas [name] is a 79 y.o. male with a history of A. fib, coronary artery disease who presents after an episode yesterday of flushing and lightheadedness. He describes the sensation as "dizziness" with possible mild off-balance. He also saw white spots in his vision bilaterally. He describes his vision appeared blurred at that time. He had a sensation of being flushed during this episode. This passed and he returned to normal self after 5-7 minutes. He denies a sensation of the room spinning.   He has never had anything like this happen before. He denies any sensation of palpitations. He did not try to walk during the event.    ROS: A 14 point ROS was performed and is negative except as noted in the HPI.   Past Medical History  Diagnosis Date  . Coronary artery disease     a. s/p CABG 1986. b. Multiple PCIs/caths. c. 09/2013: s/p PTCA and BMS to SVG-OM.  [name] PAF (paroxysmal atrial fibrillation)   . History of bleeding peptic ulcer 1980  . History of abdominal aortic aneurysm   . History of epididymitis 2013  . Testicular swelling   . Hydronephrosis with ureteropelvic junction obstruction   . Hydroureter on left 2009  . Status post coronary artery bypass grafting 1986    LIMA to the LAD, SVG to OM, SVG to RCA  . Biventricular ICD (implantable cardioverter-defibrillator) in place 03/24/2005    Implantation of a [place], serial number [id number]  . Ischemic cardiomyopathy     a. Prior EF 30-35%, s/p BIV-ICD. b. 09/2013: EF 45-50%.  . Deaf   . CKD (chronic kidney disease), stage III   . Moderate tricuspid regurgitation   . HTN (hypertension)     Family History: No history of stroke   Social History: Tob: Denies   Exam: Current vital signs: BP 129/80  Pulse 83  Temp(Src) 97.4 F (36.3 C) (Oral)  Resp 16  SpO2  97% Vital signs in last 24 hours: Temp:  [97.4 F (36.3 C)] 97.4 F (36.3 C) (08/05 1302) Pulse Rate:  [71-84] 83 (08/05 1630) Resp:  [16-24] 16 (08/05 1711) BP: (129-143)/(80-86) 129/80 mmHg (08/05 1711) SpO2:  [91 %-97 %] 97 % (08/05 1711) Weight:  [105.235 kg (232 lb)] 105.235 kg (232 lb) (08/05 1104)  General: In bed, NAD CV: Regular rate and rhythm  Mental Status: Patient is awake, alert, oriented to person, place, month, year, and situation. Immediate and remote memory are intact. Patient is able to give a clear and coherent history. No signs of aphasia or neglect Cranial Nerves: II: Visual Fields are full. No field cut was seen. Pupils are equal, round, and reactive to light.  Discs are difficult to visualize. III,IV, VI: EOMI without ptosis or diploplia.  V: Facial sensation is symmetric to temperature VII: Facial movement is symmetric.  VIII: hearing is intact to voice X: Uvula elevates symmetrically XI: Shoulder shrug is symmetric. XII: tongue is midline without atrophy or fasciculations.  Motor: Tone is normal. Bulk is normal. 5/5 strength was present in all four extremities.  Sensory: Sensation is symmetric to light touch and temperature in the arms and legs. Deep Tendon Reflexes: 2+ and symmetric in the biceps and patellae.  Cerebellar: FNF and HKS are intact bilaterally Gait: Not assessed due to multiple medical monitors in ED setting.   I have reviewed labs in epic  and the results pertinent to this consultation are: cmp - mildly elevated creatinine  I have reviewed the images obtained: CT head - negative  Impression: 79 yo M with transient flushing, blurred vision in both fields and eyes, and lightheadedness. Given the description, I wonder about a vagal even with the flushing. It sounds more like presyncope than TIA and I do not think that further evaluation is necessary.   The field cut described in the note from the referring physician was not  replicated here, though as documented it would be monocular suggesting ocular rather than cerebral pathology in any case.   Recommendations: 1) No further neurodiagnostic testing unless further events occur.    Roland Rack, MD [facility] [phone]  If 7pm- 7am, please page neurology on call as listed in [place].

## 2013-10-18 NOTE — Discharge Instructions (Signed)
It is likely that a short episode that you had yesterday of dizziness, flushing, and blurred vision was not due a small stroke (transient ischemic attack, TIA). Please followup with your primary care provider if the symptoms recur.

## 2013-10-19 ENCOUNTER — Ambulatory Visit (INDEPENDENT_AMBULATORY_CARE_PROVIDER_SITE_OTHER): Payer: Medicare Other | Admitting: *Deleted

## 2013-10-19 ENCOUNTER — Encounter: Payer: Self-pay | Admitting: Internal Medicine

## 2013-10-19 DIAGNOSIS — I428 Other cardiomyopathies: Secondary | ICD-10-CM

## 2013-10-19 DIAGNOSIS — I5022 Chronic systolic (congestive) heart failure: Secondary | ICD-10-CM

## 2013-10-19 LAB — MDC_IDC_ENUM_SESS_TYPE_REMOTE
Battery Voltage: 2.99 V
Brady Statistic AP VP Percent: 0 %
Brady Statistic AP VS Percent: 0 %
Brady Statistic AS VP Percent: 97.44 %
Brady Statistic RA Percent Paced: 0 %
Brady Statistic RV Percent Paced: 97.8 %
HIGH POWER IMPEDANCE MEASURED VALUE: 79 Ohm
HighPow Impedance: 266 Ohm
Lead Channel Impedance Value: 399 Ohm
Lead Channel Pacing Threshold Amplitude: 0.875 V
Lead Channel Pacing Threshold Pulse Width: 0.4 ms
Lead Channel Sensing Intrinsic Amplitude: 15.375 mV
Lead Channel Sensing Intrinsic Amplitude: 15.375 mV
Lead Channel Setting Pacing Amplitude: 2 V
Lead Channel Setting Pacing Pulse Width: 0.4 ms
Lead Channel Setting Pacing Pulse Width: 0.6 ms
MDC IDC MSMT BATTERY REMAINING LONGEVITY: 81 mo
MDC IDC MSMT LEADCHNL LV PACING THRESHOLD AMPLITUDE: 1.5 V
MDC IDC MSMT LEADCHNL LV PACING THRESHOLD PULSEWIDTH: 0.6 ms
MDC IDC MSMT LEADCHNL RA SENSING INTR AMPL: 1 mV
MDC IDC MSMT LEADCHNL RA SENSING INTR AMPL: 1 mV
MDC IDC MSMT LEADCHNL RV IMPEDANCE VALUE: 456 Ohm
MDC IDC SESS DTM: 20150806173216
MDC IDC SET LEADCHNL LV PACING AMPLITUDE: 2.5 V
MDC IDC SET LEADCHNL RV SENSING SENSITIVITY: 0.3 mV
MDC IDC SET ZONE DETECTION INTERVAL: 300 ms
MDC IDC SET ZONE DETECTION INTERVAL: 350 ms
MDC IDC STAT BRADY AS VS PERCENT: 2.56 %
Zone Setting Detection Interval: 360 ms
Zone Setting Detection Interval: 400 ms

## 2013-10-19 NOTE — Progress Notes (Signed)
Remote ICD transmission.   

## 2013-10-20 NOTE — Addendum Note (Signed)
Addended by: Vear Clock on: 10/20/2013 12:40 PM   Modules accepted: Orders

## 2013-10-20 NOTE — ED Provider Notes (Signed)
I saw and evaluated the patient, reviewed the resident's note and I agree with the findings and plan.   EKG Interpretation   Date/Time:  Wednesday October 18 2013 16:11:13 EDT Ventricular Rate:  75 PR Interval:    QRS Duration: 149 QT Interval:  449 QTC Calculation: 501 R Axis:   -76 Text Interpretation:  Complete AV block with wide QRS complex, paced  Left  bundle branch block No significant change since last tracing Confirmed by  Sava Proby  MD, Charan Prieto ([id number]) on 10/18/2013 4:23:09 PM      Thomas [name] is a 79 y.o. male hx of paroxysmal afib, CAD s/o PCI, AAA, ICD here with blurry vision. Blurry vision lasting 5-7 min yesterday. No other weakness or numbness. Sent from clinic for r/o TIA vs stroke. Patient deaf but with translator, has CN 2-12 intact. Nl strength and sensation throughout. Labs and CT head unremarkable. Neuro evaluated patient and felt that he likely had near syncope and doesn't need TIA or stroke workup. Patient has ICD and is closely followed up with cardiology so is stable for outpatient workup.   Results for orders placed during the hospital encounter of 10/18/13  CBC WITH DIFFERENTIAL      Result Value Ref Range   WBC 8.0  4.0 - 10.5 K/uL   RBC 4.55  4.22 - 5.81 MIL/uL   Hemoglobin 14.5  13.0 - 17.0 g/dL   HCT 41.9  39.0 - 52.0 %   MCV 92.1  78.0 - 100.0 fL   MCH 31.9  26.0 - 34.0 pg   MCHC 34.6  30.0 - 36.0 g/dL   RDW 13.5  11.5 - 15.5 %   Platelets 165  150 - 400 K/uL   Neutrophils Relative % 64  43 - 77 %   Neutro Abs 5.1  1.7 - 7.7 K/uL   Lymphocytes Relative 23  12 - 46 %   Lymphs Abs 1.8  0.7 - 4.0 K/uL   Monocytes Relative 11  3 - 12 %   Monocytes Absolute 0.9  0.1 - 1.0 K/uL   Eosinophils Relative 2  0 - 5 %   Eosinophils Absolute 0.2  0.0 - 0.7 K/uL   Basophils Relative 0  0 - 1 %   Basophils Absolute 0.0  0.0 - 0.1 K/uL  COMPREHENSIVE METABOLIC PANEL      Result Value Ref Range   Sodium 141  137 - 147 mEq/L   Potassium 4.0  3.7 - 5.3 mEq/L   Chloride 102  96 - 112 mEq/L   CO2 24  19 - 32 mEq/L   Glucose, Bld 88  70 - 99 mg/dL   BUN 30 (*) 6 - 23 mg/dL   Creatinine, Ser 1.39 (*) 0.50 - 1.35 mg/dL   Calcium 9.7  8.4 - 10.5 mg/dL   Total Protein 7.4  6.0 - 8.3 g/dL   Albumin 4.2  3.5 - 5.2 g/dL   AST 27  0 - 37 U/L   ALT 25  0 - 53 U/L   Alkaline Phosphatase 66  39 - 117 U/L   Total Bilirubin 0.8  0.3 - 1.2 mg/dL   GFR calc non Af Amer 48 (*) >90 mL/min   GFR calc Af Amer 55 (*) >90 mL/min   Anion gap 15  5 - 15  CBG MONITORING, ED      Result Value Ref Range   Glucose-Capillary 77  70 - 99 mg/dL  I-STAT CHEM 8, ED  Result Value Ref Range   Sodium 140  137 - 147 mEq/L   Potassium 3.8  3.7 - 5.3 mEq/L   Chloride 105  96 - 112 mEq/L   BUN 32 (*) 6 - 23 mg/dL   Creatinine, Ser 1.50 (*) 0.50 - 1.35 mg/dL   Glucose, Bld 77  70 - 99 mg/dL   Calcium, Ion 1.23  1.13 - 1.30 mmol/L   TCO2 22  0 - 100 mmol/L   Hemoglobin 15.0  13.0 - 17.0 g/dL   HCT 44.0  39.0 - 52.0 %  I-STAT TROPOININ, ED      Result Value Ref Range   Troponin i, poc 0.00  0.00 - 0.08 ng/mL   Comment 3           I-STAT TROPOININ, ED      Result Value Ref Range   Troponin i, poc 0.03  0.00 - 0.08 ng/mL   Comment 3            Ct Head Wo Contrast  10/18/2013   CLINICAL DATA:  Blurred vision and dizziness.  EXAM: CT HEAD WITHOUT CONTRAST  TECHNIQUE: Contiguous axial images were obtained from the base of the skull through the vertex without intravenous contrast.  COMPARISON:  None.  FINDINGS: Ventricles are normal in configuration. There is ventricular and sulcal enlargement reflecting mild atrophy. No hydrocephalus.  No parenchymal masses or mass effect. There is patchy white matter hypoattenuation consistent with mild to moderate chronic microvascular ischemic change. There is no evidence of an infarct.  There are no extra-axial masses or abnormal fluid collections.  No intracranial hemorrhage.  Visualized sinuses and mastoid air cells are clear. Clear middle  ear cavities. No skull lesion.  IMPRESSION: 1. No acute intracranial abnormalities. 2. Mild atrophy and mild to moderate chronic microvascular ischemic change.   Electronically Signed   By: Lajean Manes M.D.   On: 10/18/2013 16:52   Dg Chest Portable 1 View  09/21/2013   CLINICAL DATA:  Chest pain  EXAM: PORTABLE CHEST - 1 VIEW  COMPARISON:  12/17/2012  FINDINGS: Pulmonary vascular congestion noted with interstitial pulmonary edema. Probable small bilateral pleural effusions associated. Retrocardiac collapse/ consolidation is noted. The cardio pericardial silhouette is enlarged. Patient is status post CABG. Left-sided pacer size AICD remains in place. Telemetry leads overlie the chest.  IMPRESSION: Cardiomegaly with vascular congestion and interstitial pulmonary edema.  Retrocardiac collapse/consolidation.  Small bilateral pleural effusions.   Electronically Signed   By: Misty Stanley M.D.   On: 09/21/2013 10:09       Wandra Arthurs, MD 10/20/13 [phone]

## 2013-10-27 ENCOUNTER — Encounter: Payer: Self-pay | Admitting: Cardiology

## 2013-12-11 ENCOUNTER — Encounter: Payer: Self-pay | Admitting: Cardiovascular Disease

## 2013-12-11 ENCOUNTER — Ambulatory Visit (INDEPENDENT_AMBULATORY_CARE_PROVIDER_SITE_OTHER): Payer: Medicare Other | Admitting: Cardiovascular Disease

## 2013-12-11 VITALS — BP 149/82 | HR 93 | Resp 16 | Ht 74.0 in | Wt 240.6 lb

## 2013-12-11 DIAGNOSIS — Z95810 Presence of automatic (implantable) cardiac defibrillator: Secondary | ICD-10-CM

## 2013-12-11 DIAGNOSIS — I5022 Chronic systolic (congestive) heart failure: Secondary | ICD-10-CM

## 2013-12-11 DIAGNOSIS — I255 Ischemic cardiomyopathy: Secondary | ICD-10-CM

## 2013-12-11 DIAGNOSIS — I4891 Unspecified atrial fibrillation: Secondary | ICD-10-CM

## 2013-12-11 DIAGNOSIS — I25810 Atherosclerosis of coronary artery bypass graft(s) without angina pectoris: Secondary | ICD-10-CM

## 2013-12-11 DIAGNOSIS — I441 Atrioventricular block, second degree: Secondary | ICD-10-CM

## 2013-12-11 DIAGNOSIS — I4821 Permanent atrial fibrillation: Secondary | ICD-10-CM

## 2013-12-11 DIAGNOSIS — Z7901 Long term (current) use of anticoagulants: Secondary | ICD-10-CM

## 2013-12-11 DIAGNOSIS — I2589 Other forms of chronic ischemic heart disease: Secondary | ICD-10-CM

## 2013-12-11 MED ORDER — CARVEDILOL 12.5 MG PO TABS: 12.5000 mg | ORAL_TABLET | Freq: Two times a day (BID) | ORAL | Status: DC

## 2013-12-11 NOTE — Patient Instructions (Signed)
INCREASE Carvedilol to 12.5mg  twice a day.  Dr. Sallyanne Kuster recommends that you schedule a follow-up appointment in: 3 months.

## 2013-12-12 ENCOUNTER — Encounter: Payer: Self-pay | Admitting: Cardiovascular Disease

## 2013-12-12 DIAGNOSIS — I441 Atrioventricular block, second degree: Secondary | ICD-10-CM | POA: Insufficient documentation

## 2013-12-12 NOTE — Progress Notes (Signed)
Patient ID: Thomas [name], male   DOB: 03/31/1934, 79 y.o.   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  Thomas [name] is now 79 years old and has severe CAD and advanced ischemic cardiomyopathy as well as permanent atrial fibrillation with high-grade AV block/intermittent complete heart block. He had bypass surgery in 1995.   Throughout 2013-2014 he had worsening problems with congestive heart failure and left ventricular systolic function has deteriorated. LVEF was around 30% and cardiac catheterization showed no need for revascularization. He underwent implantation of a cardiac resynchronization defibrillator in October 2014, followed by substantial clinical improvement. In July of 2015 he was admitted with a non-ST segment elevation myocardial infarction and received a stent to the saphenous vein graft to the oblique marginal artery (bare-metal 3.5x12 mm MultiLink vision in SVG to OM; occluded native coronary arteries, patent LIMA to LAD with 50-70% anastomotic stenosis, chronic occlusion of SVG-RCA).  He feels much better than he did a few months ago. He has had no bleeding complications on aspirin and warfarin therapy. He does not have a history of stroke or transient ischemic attack. Reevaluation of left ventricular ejection fraction in July 2015 showed improvement at 45-50% (February 2012 30%, September 2013 30-35%). He is working again and has no physical limitations. He denies angina, shortness of breath or lower extremity edema.  His last defibrillator remote check in August showed greater than 97% biventricular pacing. This is followed by Dr. [name] - he will see him in October in the clinic.  Allergies  Allergen Reactions  . Phenazopyridine Nausea Only    Other Reaction: GI  UPSET    Current Outpatient Prescriptions  Medication Sig Dispense Refill  . aspirin EC 81 MG EC tablet Take 1 tablet (81 mg total) by mouth daily.  30 tablet  11  . furosemide (LASIX) 40 MG tablet Take 1 tablet (40 mg total) by mouth daily.      . isosorbide mononitrate (IMDUR) 60 MG 24 hr tablet Take 90 mg by mouth daily.      [name] losartan-hydrochlorothiazide (HYZAAR) 50-12.5 MG per tablet Take 1 tablet by mouth daily.  30 tablet  6  . nitroGLYCERIN (NITROSTAT) 0.4 MG SL tablet Place 0.4 mg under the tongue every 5 (five) minutes as needed. For chest pain.      . simvastatin (ZOCOR) 20 MG tablet Take 20 mg by mouth every evening.      . warfarin (COUMADIN) 4 MG tablet Take 2-4 mg by mouth daily. 4 mg on Monday and Friday, 2 mg on Tuesday, Wednesday, Thursday, Saturday, and Sunday.      . carvedilol (COREG) 12.5 MG tablet Take 1 tablet (12.5 mg total) by mouth 2 (two) times daily.  180 tablet  3   No current facility-administered medications for this visit.    Past Medical History  Diagnosis Date  . Coronary artery disease     a. s/p CABG 1986. b. Multiple PCIs/caths. c. 09/2013: s/p PTCA and BMS to SVG-OM.  [name] PAF (paroxysmal atrial fibrillation)   . History of bleeding peptic ulcer 1980  . History of abdominal aortic aneurysm   . History of epididymitis 2013  . Testicular swelling   . Hydronephrosis with ureteropelvic junction obstruction   . Hydroureter on left 2009  . Status post coronary  artery bypass grafting 1986    LIMA to the LAD, SVG to OM, SVG to RCA  . Biventricular ICD (implantable cardioverter-defibrillator) in place 03/24/2005    Implantation of a [place], serial number [id number]  . Ischemic cardiomyopathy     a. Prior EF 30-35%, s/p BIV-ICD. b. 09/2013: EF 45-50%.  . Deaf   . CKD (chronic kidney disease), stage III   . Moderate tricuspid regurgitation   . HTN (hypertension)     Past Surgical History  Procedure Laterality Date  . Coronary artery  bypass graft  1986  . Insert / replace / remove pacemaker    . Transurethral resection of prostate      s/p  . Cardiac catheterization  12/10/2011    SVG to OM widely patent.  LIMA to LAD patent  . 2-d echocardiogram  11/20/2011    Ejection fraction 30-35% moderate concentric left ventricular hypertrophy. Left atrium is moderately dilated. Mild MR. Mild or  . Persantine myoview  05/06/2010    Post-rest ejection fraction 30%. No significant ischemia demonstrated. Compared to previous study there is no significant change.    No family history on file.  History   Social History  . Marital Status: Married    Spouse Name: N/A    Number of Children: N/A  . Years of Education: N/A   Occupational History  . Not on file.   Social History Main Topics  . Smoking status: Former Smoker    Quit date: 03/15/1985  . Smokeless tobacco: Not on file  . Alcohol Use: Yes     Comment: occas.  . Drug Use: No  . Sexual Activity: Not Currently   Other Topics Concern  . Not on file   Social History Narrative  . No narrative on file    Review of systems: The patient specifically denies any chest pain at rest or with exertion, dyspnea at rest or with exertion, orthopnea, paroxysmal nocturnal dyspnea, syncope, palpitations, focal neurological deficits, intermittent claudication, lower extremity edema, unexplained weight gain, cough, hemoptysis or wheezing.  The patient also denies abdominal pain, nausea, vomiting, dysphagia, diarrhea, constipation, polyuria, polydipsia, dysuria, hematuria, frequency, urgency, abnormal bleeding or bruising, fever, chills, unexpected weight changes, mood swings, change in skin or hair texture, change in voice quality, auditory or visual problems, allergic reactions or rashes, new musculoskeletal complaints other than usual "aches and pains".   PHYSICAL EXAM BP 149/82  Pulse 93  Resp 16  Ht 6\' 2"  (1.88 m)  Wt 109.135 kg (240 lb 9.6 oz)  BMI 30.88  kg/m2  General: Alert, oriented x3, no distress Head: no evidence of trauma, PERRL, EOMI, no exophtalmos or lid lag, no myxedema, no xanthelasma; normal ears, nose and oropharynx Neck: normal jugular venous pulsations and no hepatojugular reflux; brisk carotid pulses without delay and no carotid bruits Chest: clear to auscultation, no signs of consolidation by percussion or palpation, normal fremitus, symmetrical and full respiratory excursions; defibrillator site and sternotomy scar well-healed Cardiovascular: normal position and quality of the apical impulse, regular rhythm, normal first and paradoxically split second heart sounds, no murmurs, rubs or gallops Abdomen: no tenderness or distention, no masses by palpation, no abnormal pulsatility or arterial bruits, normal bowel sounds, no hepatosplenomegaly Extremities: no clubbing, cyanosis or edema; 2+ radial, ulnar and brachial pulses bilaterally; 2+ right femoral, posterior tibial and dorsalis pedis pulses; 2+ left femoral, posterior tibial and dorsalis pedis pulses; no subclavian or femoral bruits Neurological: grossly nonfocal   Lipid Panel  Component Value Date/Time   CHOL 112 09/22/2013 0125   TRIG 93 09/22/2013 0125   HDL 32* 09/22/2013 0125   CHOLHDL 3.5 09/22/2013 0125   VLDL 19 09/22/2013 0125   LDLCALC 61 09/22/2013 0125    BMET    Component Value Date/Time   NA 141 10/18/2013 1615   K 4.0 10/18/2013 1615   CL 102 10/18/2013 1615   CO2 24 10/18/2013 1615   GLUCOSE 88 10/18/2013 1615   BUN 30* 10/18/2013 1615   CREATININE 1.39* 10/18/2013 1615   CALCIUM 9.7 10/18/2013 1615   GFRNONAA 48* 10/18/2013 1615   GFRAA 55* 10/18/2013 1615     ASSESSMENT AND PLAN  Biventricular ICD Medtronic Viva single chamber October 2014  Normal device function. Patient will follow up with Dr.Taylor as scheduled. Chronic systolic heart failure  He is on appropriate treatment with an angiotensin receptor blocker and carvedilol. I think it's time to try to  further increase the carvedilol to 12.5 mg twice a day, since he seems to be hemodynamically compensated.  Permanent atrial fibrillation  He is appropriately anticoagulated. Satisfactory rate control. Increasing the dose of beta blocker may further improve the efficiency of biventricular pacing CAD Status post coronary artery bypass grafting: 1995  Status post non-ST segment elevation myocardial infarction and stent of the saphenous vein graft to the oblique marginal artery in July 2015 (bare-metal stent, now no longer on Plavix). Watch first possible signs of restenosis early next year. Also has extensive inferior wall scar following complete occlusion of both the native right coronary artery and the SVG to RCA. Most recent ejection fraction around 45%.   Meds ordered this encounter  Medications  . carvedilol (COREG) 12.5 MG tablet    Sig: Take 1 tablet (12.5 mg total) by mouth 2 (two) times daily.    Dispense:  180 tablet    Refill:  Vado Esaul Dorwart, MD, [facility] [phone] office [phone] pager

## 2014-01-15 ENCOUNTER — Encounter: Payer: Self-pay | Admitting: Internal Medicine

## 2014-01-15 ENCOUNTER — Other Ambulatory Visit: Payer: Self-pay | Admitting: Cardiovascular Disease

## 2014-01-15 ENCOUNTER — Ambulatory Visit (INDEPENDENT_AMBULATORY_CARE_PROVIDER_SITE_OTHER): Payer: Medicare Other | Admitting: Internal Medicine

## 2014-01-15 VITALS — BP 132/80 | HR 82 | Ht 74.0 in | Wt 240.8 lb

## 2014-01-15 DIAGNOSIS — Z95810 Presence of automatic (implantable) cardiac defibrillator: Secondary | ICD-10-CM

## 2014-01-15 DIAGNOSIS — I5022 Chronic systolic (congestive) heart failure: Secondary | ICD-10-CM

## 2014-01-15 DIAGNOSIS — I4821 Permanent atrial fibrillation: Secondary | ICD-10-CM

## 2014-01-15 DIAGNOSIS — I482 Chronic atrial fibrillation: Secondary | ICD-10-CM

## 2014-01-15 DIAGNOSIS — I441 Atrioventricular block, second degree: Secondary | ICD-10-CM

## 2014-01-15 DIAGNOSIS — I255 Ischemic cardiomyopathy: Secondary | ICD-10-CM

## 2014-01-15 LAB — MDC_IDC_ENUM_SESS_TYPE_INCLINIC
Lead Channel Pacing Threshold Amplitude: 0.75 V
Lead Channel Pacing Threshold Amplitude: 1.5 V
Lead Channel Pacing Threshold Pulse Width: 0.4 ms
Lead Channel Setting Pacing Amplitude: 2.5 V
Lead Channel Setting Pacing Pulse Width: 0.4 ms
Lead Channel Setting Pacing Pulse Width: 0.6 ms
Lead Channel Setting Sensing Sensitivity: 0.3 mV
MDC IDC MSMT LEADCHNL LV PACING THRESHOLD PULSEWIDTH: 0.6 ms
MDC IDC SET LEADCHNL RV PACING AMPLITUDE: 2 V
MDC IDC SET ZONE DETECTION INTERVAL: 400 ms
Zone Setting Detection Interval: 300 ms
Zone Setting Detection Interval: 350 ms
Zone Setting Detection Interval: 360 ms

## 2014-01-15 NOTE — Progress Notes (Signed)
HPI [name] returns today for followup. He is a pleasant 79 yo man with an ICM, chronic systolic CHF, LBBB, EF 53%. He underwent BiV ICD implant over a year ago.  In the interim, the patient is much improved. His chronic systolic heart failure has improved from class IIIB to class IIA. He has had no syncope. He denies chest pain or peripheral edema. No ICD shock. He remains active. He admits to some dietary indiscretion. Allergies  Allergen Reactions  . Phenazopyridine Nausea Only and Other (See Comments)    Other Reaction: GI UPSET     Current Outpatient Prescriptions  Medication Sig Dispense Refill  . aspirin EC 81 MG EC tablet Take 1 tablet (81 mg total) by mouth daily. 30 tablet 11  . carvedilol (COREG) 12.5 MG tablet Take 1 tablet (12.5 mg total) by mouth 2 (two) times daily. 180 tablet 3  . furosemide (LASIX) 40 MG tablet Take 1 tablet (40 mg total) by mouth daily.    . isosorbide mononitrate (IMDUR) 60 MG 24 hr tablet Take 90 mg by mouth daily.    [name] losartan-hydrochlorothiazide (HYZAAR) 50-12.5 MG per tablet Take 1 tablet by mouth daily. 30 tablet 6  . nitroGLYCERIN (NITROSTAT) 0.4 MG SL tablet Place 0.4 mg under the tongue every 5 (five) minutes as needed (MAX 3 TABLETS). For chest pain.    . simvastatin (ZOCOR) 20 MG tablet Take 20 mg by mouth every evening.    . vitamin B-12 (CYANOCOBALAMIN) 500 MCG tablet Take 500 mcg by mouth daily.    [name] warfarin (COUMADIN) 4 MG tablet Take 2-4 mg by mouth daily. 4 mg on Monday and Friday, 2 mg on Tuesday, Wednesday, Thursday, Saturday, and Sunday.     No current facility-administered medications for this visit.     Past Medical History  Diagnosis Date  . Coronary artery disease     a. s/p CABG 1986. b. Multiple PCIs/caths. c. 09/2013: s/p PTCA and BMS to SVG-OM.  [name] PAF (paroxysmal atrial fibrillation)   . History of bleeding peptic ulcer 1980  . History of abdominal aortic aneurysm   . History of epididymitis 2013  .  Testicular swelling   . Hydronephrosis with ureteropelvic junction obstruction   . Hydroureter on left 2009  . Status post coronary artery bypass grafting 1986    LIMA to the LAD, SVG to OM, SVG to RCA  . Biventricular ICD (implantable cardioverter-defibrillator) in place 03/24/2005    Implantation of a [place], serial number [id number]  . Ischemic cardiomyopathy     a. Prior EF 30-35%, s/p BIV-ICD. b. 09/2013: EF 45-50%.  . Deaf   . CKD (chronic kidney disease), stage III   . Moderate tricuspid regurgitation   . HTN (hypertension)     ROS:   All systems reviewed and negative except as noted in the HPI.   Past Surgical History  Procedure Laterality Date  . Coronary artery bypass graft  1986  . Insert / replace / remove pacemaker    . Transurethral resection of prostate      s/p  . Cardiac catheterization  12/10/2011    SVG to OM widely patent.  LIMA to LAD patent  . 2-d echocardiogram  11/20/2011    Ejection fraction 30-35% moderate concentric left ventricular hypertrophy. Left atrium is moderately dilated. Mild MR. Mild or  . Persantine myoview  05/06/2010    Post-rest ejection fraction 30%. No significant ischemia demonstrated. Compared to previous study there is  no significant change.     History reviewed. No pertinent family history.   History   Social History  . Marital Status: Married    Spouse Name: N/A    Number of Children: N/A  . Years of Education: N/A   Occupational History  . Not on file.   Social History Main Topics  . Smoking status: Former Smoker    Quit date: 03/15/1985  . Smokeless tobacco: Not on file  . Alcohol Use: Yes     Comment: occas.  . Drug Use: No  . Sexual Activity: Not Currently   Other Topics Concern  . Not on file   Social History Narrative     BP 132/80 mmHg  Pulse 82  Ht 6\' 2"  (1.88 m)  Wt 240 lb 12.8 oz (109.226 kg)  BMI 30.90 kg/m2  Physical Exam:  Well appearing 79 year old man, NAD HEENT:  Unremarkable Neck:  No JVD, no thyromegally Back:  No CVA tenderness Lungs:  Clear with no wheezes, rales, or rhonchi. HEART:  Regular rate rhythm, no murmurs, no rubs, no clicks Abd:  soft, positive bowel sounds, no organomegally, no rebound, no guarding Ext:  2 plus pulses, no edema, no cyanosis, no clubbing Skin:  No rashes no nodules Neuro:  CN II through XII intact, motor grossly intact   DEVICE  Normal device function.  See PaceArt for details.   Assess/Plan:

## 2014-01-15 NOTE — Assessment & Plan Note (Signed)
His Medtronic Biv ICD is working normally. Will recheck in several months.

## 2014-01-15 NOTE — Patient Instructions (Signed)
Your physician wants you to follow-up in: 12 months with Dr Knox Saliva will receive a reminder letter in the mail two months in advance. If you don't receive a letter, please call our office to schedule the follow-up appointment.  Remote monitoring is used to monitor your Pacemaker of ICD from home. This monitoring reduces the number of office visits required to check your device to one time per year. It allows [place] to keep an eye on the functioning of your device to ensure it is working properly. You are scheduled for a device check from home on 04/18/14. You may send your transmission at any time that day. If you have a wireless device, the transmission will be sent automatically. After your physician reviews your transmission, you will receive a postcard with your next transmission date.

## 2014-01-15 NOTE — Assessment & Plan Note (Signed)
His chronic systolic heart failure is class 2. No change in meds. He will work on reducing his sodium intake.

## 2014-01-15 NOTE — Assessment & Plan Note (Signed)
His atrial fib is well controlled. No change in medical therapy. He is in atrial fib 100% of the time.

## 2014-01-17 ENCOUNTER — Encounter: Payer: Self-pay | Admitting: Internal Medicine

## 2014-02-14 ENCOUNTER — Emergency Department: Payer: Self-pay | Admitting: Emergency Medicine

## 2014-02-14 LAB — CBC
HCT: 43.3 % (ref 40.0–52.0)
HGB: 14.2 g/dL (ref 13.0–18.0)
MCH: 31.1 pg (ref 26.0–34.0)
MCHC: 32.8 g/dL (ref 32.0–36.0)
MCV: 95 fL (ref 80–100)
Platelet: 154 10*3/uL (ref 150–440)
RBC: 4.56 10*6/uL (ref 4.40–5.90)
RDW: 14.1 % (ref 11.5–14.5)
WBC: 7.5 10*3/uL (ref 3.8–10.6)

## 2014-02-14 LAB — TROPONIN I
Troponin-I: 0.05 ng/mL
Troponin-I: 0.05 ng/mL

## 2014-02-14 LAB — COMPREHENSIVE METABOLIC PANEL
ALK PHOS: 70 U/L
ALT: 29 U/L
ANION GAP: 8 (ref 7–16)
AST: 21 U/L (ref 15–37)
Albumin: 4 g/dL (ref 3.4–5.0)
BILIRUBIN TOTAL: 1.2 mg/dL — AB (ref 0.2–1.0)
BUN: 30 mg/dL — ABNORMAL HIGH (ref 7–18)
CALCIUM: 9.6 mg/dL (ref 8.5–10.1)
CHLORIDE: 107 mmol/L (ref 98–107)
CO2: 25 mmol/L (ref 21–32)
Creatinine: 1.24 mg/dL (ref 0.60–1.30)
EGFR (Non-African Amer.): >60
Glucose: 103 mg/dL — ABNORMAL HIGH (ref 65–99)
OSMOLALITY: 286 (ref 275–301)
POTASSIUM: 4.1 mmol/L (ref 3.5–5.1)
Sodium: 140 mmol/L (ref 136–145)
TOTAL PROTEIN: 7.5 g/dL (ref 6.4–8.2)

## 2014-02-14 LAB — HEMOGLOBIN: HGB: 14.3 g/dL (ref 13.0–18.0)

## 2014-02-14 LAB — PROTIME-INR
INR: 1.9
Prothrombin Time: 21.4 secs — ABNORMAL HIGH (ref 11.5–14.7)

## 2014-02-19 ENCOUNTER — Encounter: Payer: Self-pay | Admitting: Cardiovascular Disease

## 2014-02-19 ENCOUNTER — Ambulatory Visit (INDEPENDENT_AMBULATORY_CARE_PROVIDER_SITE_OTHER): Payer: Medicare Other | Admitting: Cardiovascular Disease

## 2014-02-19 ENCOUNTER — Other Ambulatory Visit: Payer: Self-pay | Admitting: *Deleted

## 2014-02-19 VITALS — BP 140/70 | HR 84 | Ht 74.0 in | Wt 235.6 lb

## 2014-02-19 DIAGNOSIS — I214 Non-ST elevation (NSTEMI) myocardial infarction: Secondary | ICD-10-CM

## 2014-02-19 DIAGNOSIS — I255 Ischemic cardiomyopathy: Secondary | ICD-10-CM

## 2014-02-19 DIAGNOSIS — I441 Atrioventricular block, second degree: Secondary | ICD-10-CM

## 2014-02-19 DIAGNOSIS — I4821 Permanent atrial fibrillation: Secondary | ICD-10-CM

## 2014-02-19 DIAGNOSIS — I5022 Chronic systolic (congestive) heart failure: Secondary | ICD-10-CM

## 2014-02-19 DIAGNOSIS — I25810 Atherosclerosis of coronary artery bypass graft(s) without angina pectoris: Secondary | ICD-10-CM

## 2014-02-19 DIAGNOSIS — I482 Chronic atrial fibrillation: Secondary | ICD-10-CM

## 2014-02-19 DIAGNOSIS — K921 Melena: Secondary | ICD-10-CM

## 2014-02-19 DIAGNOSIS — I071 Rheumatic tricuspid insufficiency: Secondary | ICD-10-CM

## 2014-02-19 DIAGNOSIS — Z95810 Presence of automatic (implantable) cardiac defibrillator: Secondary | ICD-10-CM

## 2014-02-19 MED ORDER — APIXABAN 5 MG PO TABS: 5.0000 mg | ORAL_TABLET | Freq: Two times a day (BID) | ORAL | Status: DC

## 2014-02-19 NOTE — Patient Instructions (Signed)
STAY off Coumadin.  START Eliquis 5mg  twice a day.  Dr. Sallyanne Kuster recommends that you schedule a follow-up appointment in: one month.

## 2014-02-20 ENCOUNTER — Encounter: Payer: Self-pay | Admitting: Cardiovascular Disease

## 2014-02-20 DIAGNOSIS — K921 Melena: Secondary | ICD-10-CM | POA: Insufficient documentation

## 2014-02-20 DIAGNOSIS — I361 Nonrheumatic tricuspid (valve) insufficiency: Secondary | ICD-10-CM | POA: Insufficient documentation

## 2014-02-20 DIAGNOSIS — I071 Rheumatic tricuspid insufficiency: Secondary | ICD-10-CM

## 2014-02-20 NOTE — Progress Notes (Signed)
Patient ID: Thomas [name], male   DOB: [birth year], 79 y.o.   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  Within the last week he was seen in the [facility] regional emergency department for an episode of hematochezia. He was worried about the bleeding but from the emergency room description there was very scanty evidence of bleeding. His hemoglobin was normal 14.9. His INR was only 1.9. I'm not sure why but he was told to discontinue his Coumadin. He has not had any bleeding since. He does not have a history of lower GI bleeding in the past. Today his INR is 1.2. He does not have a history of stroke or TIA.  He does express a lot of frustration regarding the changes in his warfarin dosage. He states that he often gets confused as to what the correct amount of warfarin is.  Thomas [name] is now 79 years old and has severe CAD and advanced ischemic cardiomyopathy as well as permanent atrial fibrillation with high-grade AV block/intermittent complete heart block. He had bypass surgery in 1995.   Throughout 2013-2014 he had worsening problems with congestive heart failure and left ventricular systolic function has deteriorated. LVEF was around 30% and cardiac catheterization showed no need for revascularization. He underwent implantation of a cardiac resynchronization defibrillator in October 2014, followed by substantial clinical improvement. In July of 2015 he was admitted with a non-ST segment elevation myocardial infarction and received a stent to the saphenous vein graft to the oblique marginal artery (bare-metal 3.5x12 mm MultiLink vision in SVG to OM; occluded native coronary arteries, patent LIMA to LAD with 50-70% anastomotic stenosis, chronic occlusion of SVG-RCA).  Reevaluation of left ventricular ejection fraction in July 2015 showed improvement at 45-50% (February 2012 30%, September 2013 30-35%). He is working again and has no physical limitations. He denies angina, shortness of breath or lower extremity edema. His last defibrillator remote check in August showed greater than 97% biventricular pacing. No recent episodes of atrial fibrillation. His device is followed by Dr. [name].   Allergies  Allergen Reactions  . Phenazopyridine Nausea Only and Other (See Comments)    Other Reaction: GI UPSET    Current Outpatient Prescriptions  Medication Sig Dispense Refill  . carvedilol (COREG) 12.5 MG tablet Take 1 tablet (12.5 mg total) by mouth 2 (two) times daily. 180 tablet 3  . furosemide (LASIX) 40 MG tablet Take 1 tablet (40 mg total) by mouth daily.    . isosorbide mononitrate (IMDUR) 60 MG 24 hr tablet Take 90 mg by mouth daily.    [name] losartan-hydrochlorothiazide (HYZAAR) 50-12.5 MG per tablet Take 1 tablet by mouth daily. 30 tablet 6  . nitroGLYCERIN (NITROSTAT) 0.4 MG SL tablet Place 0.4 mg under the tongue every 5 (five) minutes as needed (MAX 3 TABLETS). For chest pain.    . simvastatin (ZOCOR) 20 MG tablet Take 20 mg by mouth every evening.    . simvastatin (ZOCOR) 20 MG tablet TAKE 1 TABLET BY MOUTH DAILY 30 tablet 6  . vitamin B-12 (CYANOCOBALAMIN) 500 MCG tablet Take 500 mcg by mouth daily.    [name] apixaban (ELIQUIS) 5 MG TABS tablet Take 1 tablet (5 mg total) by mouth 2 (two) times daily. 60 tablet 5  . aspirin EC 81 MG EC tablet  Take 1 tablet (81 mg total) by mouth daily. (Patient not taking: Reported on 02/19/2014) 30 tablet 11   No current facility-administered medications for this visit.    Past Medical History  Diagnosis Date  . Coronary artery disease     a. s/p CABG 1986. b. Multiple PCIs/caths. c. 09/2013: s/p PTCA and BMS to SVG-OM.  [name] PAF (paroxysmal atrial fibrillation)   . History of bleeding peptic ulcer 1980  . History of  abdominal aortic aneurysm   . History of epididymitis 2013  . Testicular swelling   . Hydronephrosis with ureteropelvic junction obstruction   . Hydroureter on left 2009  . Status post coronary artery bypass grafting 1986    LIMA to the LAD, SVG to OM, SVG to RCA  . Biventricular ICD (implantable cardioverter-defibrillator) in place 03/24/2005    Implantation of a [place], serial number [id number]  . Ischemic cardiomyopathy     a. Prior EF 30-35%, s/p BIV-ICD. b. 09/2013: EF 45-50%.  . Deaf   . CKD (chronic kidney disease), stage III   . Moderate tricuspid regurgitation   . HTN (hypertension)     Past Surgical History  Procedure Laterality Date  . Coronary artery bypass graft  1986  . Insert / replace / remove pacemaker    . Transurethral resection of prostate      s/p  . Cardiac catheterization  12/10/2011    SVG to OM widely patent.  LIMA to LAD patent  . 2-d echocardiogram  11/20/2011    Ejection fraction 30-35% moderate concentric left ventricular hypertrophy. Left atrium is moderately dilated. Mild MR. Mild or  . Persantine myoview  05/06/2010    Post-rest ejection fraction 30%. No significant ischemia demonstrated. Compared to previous study there is no significant change.    No family history on file.  History   Social History  . Marital Status: Married    Spouse Name: N/A    Number of Children: N/A  . Years of Education: N/A   Occupational History  . Not on file.   Social History Main Topics  . Smoking status: Former Smoker    Quit date: 03/15/1985  . Smokeless tobacco: Not on file  . Alcohol Use: Yes     Comment: occas.  . Drug Use: No  . Sexual Activity: Not Currently   Other Topics Concern  . Not on file   Social History Narrative    Review of systems: The patient specifically denies any chest pain at rest or with exertion, dyspnea at rest or with exertion, orthopnea, paroxysmal nocturnal dyspnea, syncope, palpitations, focal  neurological deficits, intermittent claudication, lower extremity edema, unexplained weight gain, cough, hemoptysis or wheezing.  The patient also denies abdominal pain, nausea, vomiting, dysphagia, diarrhea, constipation, polyuria, polydipsia, dysuria, hematuria, frequency, urgency, abnormal bruising, fever, chills, unexpected weight changes, mood swings, change in skin or hair texture, change in voice quality, auditory or visual problems, allergic reactions or rashes, new musculoskeletal complaints other than usual "aches and pains".  PHYSICAL EXAM BP 140/70 mmHg  Pulse 84  Ht 6\' 2"  (1.88 m)  Wt 235 lb 9.6 oz (106.867 kg)  BMI 30.24 kg/m2 General: Alert, oriented x3, no distress Head: no evidence of trauma, PERRL, EOMI, no exophtalmos or lid lag, no myxedema, no xanthelasma; normal ears, nose and oropharynx Neck: normal jugular venous pulsations and no hepatojugular reflux; brisk carotid pulses without delay and no carotid bruits Chest: clear to auscultation, no signs of consolidation by percussion or palpation, normal fremitus,  symmetrical and full respiratory excursions; defibrillator site and sternotomy scar well-healed Cardiovascular: normal position and quality of the apical impulse, regular rhythm, normal first and paradoxically split second heart sounds, no rubs or gallops, 2/6 holosystolic murmur at left lower sternal border Abdomen: no tenderness or distention, no masses by palpation, no abnormal pulsatility or arterial bruits, normal bowel sounds, no hepatosplenomegaly Extremities: no clubbing, cyanosis or edema; 2+ radial, ulnar and brachial pulses bilaterally; 2+ right femoral, posterior tibial and dorsalis pedis pulses; 2+ left femoral, posterior tibial and dorsalis pedis pulses; no subclavian or femoral bruits Neurological: grossly nonfocal    Lipid Panel     Component Value Date/Time   CHOL 112 09/22/2013 0125   TRIG 93 09/22/2013 0125   HDL 32* 09/22/2013 0125   CHOLHDL  3.5 09/22/2013 0125   VLDL 19 09/22/2013 0125   LDLCALC 61 09/22/2013 0125    BMET    Component Value Date/Time   NA 141 10/18/2013 1615   K 4.0 10/18/2013 1615   CL 102 10/18/2013 1615   CO2 24 10/18/2013 1615   GLUCOSE 88 10/18/2013 1615   BUN 30* 10/18/2013 1615   CREATININE 1.39* 10/18/2013 1615   CALCIUM 9.7 10/18/2013 1615   GFRNONAA 48* 10/18/2013 1615   GFRAA 55* 10/18/2013 1615     ASSESSMENT AND PLAN  Shaheed had an isolated episode of very short-lived rectal bleeding, probably hemorrhoidal bleed. There is no evidence of ongoing bleeding and he has no worrisome gastrointestinal symptoms. His functional status is excellent. His hemoglobin was 14.9 and by exam today he does not appear to be anemic.  I think this event can be categorized as minor bleeding and is not a reason to interrupt chronic anticoagulation. He is at high risk for future embolic events related to atrial fibrillation since he has depressed left ventricular systolic function, history of hypertension advanced age and a markedly dilated left atrium.  On the other hand warfarin may not be the best choice for him, especially since there is difficulty in communication secondary to his hearing impediment. I think he will do better with a novel anticoagulant and will use Eliquis since this has a better bleeding profile especially in the setting of mild renal dysfunction.  Patient Instructions  STAY off Coumadin.  START Eliquis 5mg  twice a day.  Dr. Sallyanne Kuster recommends that you schedule a follow-up appointment in: one month.       Meds ordered this encounter  Medications  . apixaban (ELIQUIS) 5 MG TABS tablet    Sig: Take 1 tablet (5 mg total) by mouth 2 (two) times daily.    Dispense:  60 tablet    Refill:  Wallace Jheri Mitter, MD, [facility] [phone] office [phone] pager

## 2014-02-22 ENCOUNTER — Encounter (HOSPITAL_COMMUNITY): Payer: Self-pay | Admitting: Cardiovascular Disease

## 2014-03-12 ENCOUNTER — Telehealth: Payer: Self-pay | Admitting: Cardiology

## 2014-03-12 NOTE — Telephone Encounter (Signed)
Closed encounter °

## 2014-03-23 ENCOUNTER — Emergency Department: Payer: Self-pay | Admitting: Emergency Medicine

## 2014-03-26 ENCOUNTER — Ambulatory Visit: Payer: Medicare Other | Admitting: Cardiology

## 2014-03-27 ENCOUNTER — Ambulatory Visit: Payer: Medicare Other | Admitting: Cardiology

## 2014-04-02 ENCOUNTER — Encounter: Payer: Self-pay | Admitting: Physician Assistant

## 2014-04-02 ENCOUNTER — Ambulatory Visit (INDEPENDENT_AMBULATORY_CARE_PROVIDER_SITE_OTHER): Payer: PPO | Admitting: Physician Assistant

## 2014-04-02 VITALS — BP 144/88 | HR 95 | Ht 74.0 in | Wt 245.2 lb

## 2014-04-02 DIAGNOSIS — I10 Essential (primary) hypertension: Secondary | ICD-10-CM | POA: Insufficient documentation

## 2014-04-02 DIAGNOSIS — I482 Chronic atrial fibrillation: Secondary | ICD-10-CM

## 2014-04-02 DIAGNOSIS — I255 Ischemic cardiomyopathy: Secondary | ICD-10-CM

## 2014-04-02 DIAGNOSIS — I4821 Permanent atrial fibrillation: Secondary | ICD-10-CM

## 2014-04-02 DIAGNOSIS — I2583 Coronary atherosclerosis due to lipid rich plaque: Secondary | ICD-10-CM

## 2014-04-02 DIAGNOSIS — Z7901 Long term (current) use of anticoagulants: Secondary | ICD-10-CM

## 2014-04-02 DIAGNOSIS — I2510 Atherosclerotic heart disease of native coronary artery without angina pectoris: Secondary | ICD-10-CM

## 2014-04-02 DIAGNOSIS — I071 Rheumatic tricuspid insufficiency: Secondary | ICD-10-CM

## 2014-04-02 NOTE — Assessment & Plan Note (Signed)
Continue to follow.

## 2014-04-02 NOTE — Patient Instructions (Signed)
Your physician wants you to follow-up in: 6 months with Dr. Sallyanne Kuster. You will receive a reminder letter in the mail two months in advance. If you don't receive a letter, please call our office to schedule the follow-up appointment.

## 2014-04-02 NOTE — Assessment & Plan Note (Signed)
Mildly elevated.  Continue current meds

## 2014-04-02 NOTE — Assessment & Plan Note (Signed)
Rate controlled.  No changes

## 2014-04-02 NOTE — Progress Notes (Signed)
Patient ID: Thomas [name], male   DOB: 07/29/1934, 80 y.o.   MRN: [id number]    Date:  04/02/2014   ID:  Thomas [name], DOB Feb 15, 1935, MRN [id number]  PCP:  [name], MD  Primary Cardiologist:  Croitoru  Chief Complaint  Patient presents with  . Follow-up    1 month visit - no complaints     History of Present Illness: Thomas [name] is a 80 y.o. male who has severe CAD and advanced ischemic cardiomyopathy as well as permanent atrial fibrillation with high-grade AV block/intermittent complete heart block. He had bypass surgery in 1995.   Throughout 2013-2014 he had worsening problems with congestive heart failure and left ventricular systolic function has deteriorated. LVEF was around 30% and cardiac catheterization showed no need for revascularization. He underwent implantation of a cardiac resynchronization defibrillator in October 2014, followed by substantial clinical improvement. In July of 2015 he was admitted with a non-ST segment elevation myocardial infarction and received a stent to the saphenous vein graft to the oblique marginal artery (bare-metal 3.5x12 mm MultiLink vision in SVG to OM; occluded native coronary arteries, patent LIMA to LAD with 50-70% anastomotic stenosis, chronic occlusion of SVG-RCA). Reevaluation of left ventricular ejection fraction in July 2015 showed improvement at 45-50% (February 2012 30%, September 2013 30-35%). He is working again and has no physical limitations.  His last defibrillator remote check in August showed greater than 97% biventricular pacing. No recent episodes of atrial fibrillation. His device is followed by Dr. [name].  The patient presents for a three month evaluation.  He reports doing very well.  He did have three to four episodes of epistaxis.  forom the colder, drier air and eliquis.  He will monitor.  Otherwise he denies nausea, vomiting, fever, chest pain, shortness of breath, orthopnea, dizziness, PND, cough, congestion,  abdominal pain, hematochezia, melena, lower extremity edema, claudication.  Unfortunately we had a lengthy fire drill during the coldest morning of the winter.  The patient did well coming back up the stairs will only some mild dyspnea.    Wt Readings from Last 3 Encounters:  04/02/14 245 lb 3.2 oz (111.222 kg)  02/19/14 235 lb 9.6 oz (106.867 kg)  01/15/14 240 lb 12.8 oz (109.226 kg)     Past Medical History  Diagnosis Date  . Coronary artery disease     a. s/p CABG 1986. b. Multiple PCIs/caths. c. 09/2013: s/p PTCA and BMS to SVG-OM.  [name] PAF (paroxysmal atrial fibrillation)   . History of bleeding peptic ulcer 1980  . History of abdominal aortic aneurysm   . History of epididymitis 2013  . Testicular swelling   . Hydronephrosis with ureteropelvic junction obstruction   . Hydroureter on left 2009  . Status post coronary artery bypass grafting 1986    LIMA to the LAD, SVG to OM, SVG to RCA  . Biventricular ICD (implantable cardioverter-defibrillator) in place 03/24/2005    Implantation of a [place], serial number [id number]  . Ischemic cardiomyopathy     a. Prior EF 30-35%, s/p BIV-ICD. b. 09/2013: EF 45-50%.  . Deaf   . CKD (chronic kidney disease), stage III   . Moderate tricuspid regurgitation   . HTN (hypertension)     Current Outpatient Prescriptions  Medication Sig Dispense Refill  . apixaban (ELIQUIS) 5 MG TABS tablet Take 1 tablet (5 mg total) by mouth 2 (two) times daily. 60 tablet 5  . aspirin EC 81 MG EC tablet Take 1 tablet (81  mg total) by mouth daily. 30 tablet 11  . carvedilol (COREG) 12.5 MG tablet Take 1 tablet (12.5 mg total) by mouth 2 (two) times daily. 180 tablet 3  . furosemide (LASIX) 40 MG tablet Take 1 tablet (40 mg total) by mouth daily.    . isosorbide mononitrate (IMDUR) 60 MG 24 hr tablet Take 90 mg by mouth daily.    [name] losartan-hydrochlorothiazide (HYZAAR) 50-12.5 MG per tablet Take 1 tablet by mouth daily. 30 tablet 6  . nitroGLYCERIN  (NITROSTAT) 0.4 MG SL tablet Place 0.4 mg under the tongue every 5 (five) minutes as needed (MAX 3 TABLETS). For chest pain.    . simvastatin (ZOCOR) 20 MG tablet TAKE 1 TABLET BY MOUTH DAILY 30 tablet 6  . vitamin B-12 (CYANOCOBALAMIN) 500 MCG tablet Take 500 mcg by mouth daily.     No current facility-administered medications for this visit.    Allergies:    Allergies  Allergen Reactions  . Phenazopyridine Nausea Only and Other (See Comments)    Other Reaction: GI UPSET    Social History:  The patient  reports that he quit smoking about 29 years ago. He does not have any smokeless tobacco history on file. He reports that he drinks alcohol. He reports that he does not use illicit drugs.   Family history:  History reviewed. No pertinent family history.  ROS:  Please see the history of present illness.  All other systems reviewed and negative.   PHYSICAL EXAM: VS:  BP 144/88 mmHg  Pulse 95  Ht 6\' 2"  (1.88 m)  Wt 245 lb 3.2 oz (111.222 kg)  BMI 31.47 kg/m2 Well nourished, well developed, in no acute distress HEENT: Pupils are equal round react to light accommodation extraocular movements are intact.  Neck: no JVDNo cervical lymphadenopathy. Cardiac: Regular rate and rhythm with 2/6 sys MM apex and Axillae Lungs:  clear to auscultation bilaterally, no wheezing, rhonchi or rales Abd: soft, nontender, positive bowel sounds all quadrants, no hepatosplenomegaly Ext: no lower extremity edema.  2+ radial and dorsalis pedis pulses. Skin: warm and dry Neuro:  Grossly normal  EKG:  V paced 95bpm  ASSESSMENT AND PLAN:  Problem List Items Addressed This Visit    Permanent atrial fibrillation    Rate controlled.  No changes      Moderate tricuspid regurgitation    Continue to follow      Long term current use of anticoagulant therapy - Primary    Continue eliquis      Cardiomyopathy, ischemic: Ejection fraction 45%    Euvolemic.  Continue current lasix dosing      CAD Status  post coronary artery bypass grafting: 1995    No angina when coming back up the stairs during fire alarm.      Benign essential HTN    Mildly elevated.  Continue current meds        I think he can follow up in six months.

## 2014-04-02 NOTE — Assessment & Plan Note (Signed)
No angina when coming back up the stairs during fire alarm.

## 2014-04-02 NOTE — Assessment & Plan Note (Signed)
Continue eliquis  ?

## 2014-04-02 NOTE — Assessment & Plan Note (Signed)
Euvolemic.  Continue current lasix dosing

## 2014-04-07 ENCOUNTER — Other Ambulatory Visit: Payer: Self-pay | Admitting: Physician Assistant

## 2014-04-09 NOTE — Telephone Encounter (Signed)
Rx refill sent to patient pharmacy   

## 2014-04-18 ENCOUNTER — Ambulatory Visit (INDEPENDENT_AMBULATORY_CARE_PROVIDER_SITE_OTHER): Payer: PPO | Admitting: *Deleted

## 2014-04-18 DIAGNOSIS — I255 Ischemic cardiomyopathy: Secondary | ICD-10-CM

## 2014-04-18 DIAGNOSIS — I5022 Chronic systolic (congestive) heart failure: Secondary | ICD-10-CM

## 2014-04-18 LAB — MDC_IDC_ENUM_SESS_TYPE_REMOTE
Battery Voltage: 2.98 V
Brady Statistic AP VS Percent: 0 %
Brady Statistic AS VS Percent: 0.78 %
Brady Statistic RV Percent Paced: 99.33 %
HighPow Impedance: 64 Ohm
Lead Channel Impedance Value: 323 Ohm
Lead Channel Impedance Value: 380 Ohm
Lead Channel Impedance Value: 380 Ohm
Lead Channel Impedance Value: 380 Ohm
Lead Channel Impedance Value: 399 Ohm
Lead Channel Impedance Value: 456 Ohm
Lead Channel Impedance Value: 456 Ohm
Lead Channel Impedance Value: 665 Ohm
Lead Channel Impedance Value: 703 Ohm
Lead Channel Impedance Value: 722 Ohm
Lead Channel Pacing Threshold Amplitude: 0.625 V
Lead Channel Pacing Threshold Pulse Width: 0.4 ms
Lead Channel Pacing Threshold Pulse Width: 0.6 ms
Lead Channel Sensing Intrinsic Amplitude: 0.875 mV
Lead Channel Sensing Intrinsic Amplitude: 0.875 mV
Lead Channel Sensing Intrinsic Amplitude: 8.875 mV
Lead Channel Sensing Intrinsic Amplitude: 8.875 mV
Lead Channel Setting Pacing Amplitude: 2.25 V
Lead Channel Setting Pacing Pulse Width: 0.6 ms
Lead Channel Setting Sensing Sensitivity: 0.3 mV
MDC IDC MSMT BATTERY REMAINING LONGEVITY: 74 mo
MDC IDC MSMT LEADCHNL LV IMPEDANCE VALUE: 437 Ohm
MDC IDC MSMT LEADCHNL LV IMPEDANCE VALUE: 703 Ohm
MDC IDC MSMT LEADCHNL LV IMPEDANCE VALUE: 779 Ohm
MDC IDC MSMT LEADCHNL LV PACING THRESHOLD AMPLITUDE: 1.125 V
MDC IDC SESS DTM: 20160203083423
MDC IDC SET LEADCHNL LV PACING AMPLITUDE: 2.25 V
MDC IDC SET LEADCHNL RV PACING PULSEWIDTH: 0.4 ms
MDC IDC SET ZONE DETECTION INTERVAL: 300 ms
MDC IDC STAT BRADY AP VP PERCENT: 0 %
MDC IDC STAT BRADY AS VP PERCENT: 99.22 %
MDC IDC STAT BRADY RA PERCENT PACED: 0 %
Zone Setting Detection Interval: 350 ms
Zone Setting Detection Interval: 360 ms
Zone Setting Detection Interval: 400 ms

## 2014-04-18 NOTE — Progress Notes (Signed)
Remote ICD transmission.   

## 2014-04-27 ENCOUNTER — Other Ambulatory Visit: Payer: Self-pay | Admitting: Cardiovascular Disease

## 2014-04-27 ENCOUNTER — Telehealth: Payer: Self-pay | Admitting: Cardiovascular Disease

## 2014-04-27 NOTE — Telephone Encounter (Signed)
[facility] is calling because they receive a notice stating that refills are not appropriate , and the pharmacist is asking what are they to do . Please call    Thanks

## 2014-04-27 NOTE — Telephone Encounter (Signed)
Spoke to [employer] YES , AUTHORIZATION FOR ISOSORBIDE IS CORRECT.

## 2014-04-27 NOTE — Telephone Encounter (Signed)
Rx(s) sent to pharmacy electronically.  

## 2014-05-03 ENCOUNTER — Encounter: Payer: Self-pay | Admitting: Cardiology

## 2014-05-07 ENCOUNTER — Encounter: Payer: Self-pay | Admitting: Internal Medicine

## 2014-06-11 ENCOUNTER — Telehealth: Payer: Self-pay | Admitting: Cardiovascular Disease

## 2014-06-11 DIAGNOSIS — K625 Hemorrhage of anus and rectum: Secondary | ICD-10-CM

## 2014-06-11 NOTE — Telephone Encounter (Signed)
Pt's wife called in stating that he has been having blood in his stool since yesterday which has persisted into today. She would like to know if he needs to be seen . Please call back  She stated that she will be leaving for a doctors appt at 1:30  Thanks

## 2014-06-11 NOTE — Telephone Encounter (Addendum)
Use of deaf interpreter relay service  Called pt, interpreter speaking for wife. Pt has had blood in stool for past 2 days. Appears he was changed over from Coumadin to Eliquis in December 2015 for this issue, which req hospitalization.  Wife was unable to continue w/ telephone call, she has another appt to take pt to this afternoon - did confirm daily meds. Unable to determine severity of bleeding, she did state only occurrent w/ BMs.  Requested to call back & speak to [place] later this afternoon - I advised OK - in interim, will route to Dr Sallyanne Kuster & confer on options.

## 2014-06-11 NOTE — Telephone Encounter (Signed)
Pt had been at appt w/ other provider (?PCP) mid-afternoon, waited to call back until later this afternoon.  No answer, went to visual voice mail. Left message to call back tomorrow.

## 2014-06-11 NOTE — Telephone Encounter (Signed)
Hold Eliquis for 3 days, restart if bleeding has stopped. Check CBC

## 2014-06-12 NOTE — Telephone Encounter (Signed)
No answer when called this AM.

## 2014-06-13 NOTE — Telephone Encounter (Signed)
Received call form patient's wife interpreter.Dr.Croitoru advised to hold Eliquis for 3 days,restart if bleeding has stopped.Needs a CBC.Stated will have done 06/14/14.Advised to call back if continues to have bleeding.

## 2014-06-14 ENCOUNTER — Telehealth: Payer: Self-pay | Admitting: Cardiovascular Disease

## 2014-06-14 LAB — CBC WITH DIFFERENTIAL/PLATELET
Basophils Absolute: 0.1 10*3/uL (ref 0.0–0.1)
Basophils Relative: 1 % (ref 0–1)
Eosinophils Absolute: 0.2 10*3/uL (ref 0.0–0.7)
Eosinophils Relative: 3 % (ref 0–5)
HEMATOCRIT: 38.9 % — AB (ref 39.0–52.0)
Hemoglobin: 12.9 g/dL — ABNORMAL LOW (ref 13.0–17.0)
Lymphocytes Relative: 21 % (ref 12–46)
Lymphs Abs: 1.5 10*3/uL (ref 0.7–4.0)
MCH: 30.6 pg (ref 26.0–34.0)
MCHC: 33.2 g/dL (ref 30.0–36.0)
MCV: 92.2 fL (ref 78.0–100.0)
MONO ABS: 0.7 10*3/uL (ref 0.1–1.0)
MONOS PCT: 10 % (ref 3–12)
MPV: 9.6 fL (ref 8.6–12.4)
NEUTROS PCT: 65 % (ref 43–77)
Neutro Abs: 4.7 10*3/uL (ref 1.7–7.7)
Platelets: 190 10*3/uL (ref 150–400)
RBC: 4.22 MIL/uL (ref 4.22–5.81)
RDW: 14.4 % (ref 11.5–15.5)
WBC: 7.2 10*3/uL (ref 4.0–10.5)

## 2014-06-14 NOTE — Telephone Encounter (Signed)
Patient walked in with [place] for Property Tax Exclusion for Dr Sallyanne Kuster to sign.  Form was given to B Lassiter for Dr Sallyanne Kuster to sign.

## 2014-06-15 ENCOUNTER — Telehealth: Payer: Self-pay | Admitting: Cardiovascular Disease

## 2014-06-15 NOTE — Telephone Encounter (Signed)
Received [facility] Tax Exempt form from Dr. Sallyanne Kuster signed.  Mailed to patient 06/15/14. lp

## 2014-06-18 ENCOUNTER — Telehealth: Payer: Self-pay | Admitting: *Deleted

## 2014-06-18 DIAGNOSIS — D649 Anemia, unspecified: Secondary | ICD-10-CM

## 2014-06-18 NOTE — Telephone Encounter (Signed)
Lab results given to patient's wife through the interp phone service.  Voiced understanding and will have blood work rechecked in 2 weeks.

## 2014-06-18 NOTE — Telephone Encounter (Signed)
-----   Message from Sanda Klein, MD sent at 06/15/2014 10:07 AM EDT ----- Very slight reduction in hemoglobin. Repeat in 2 weeks

## 2014-06-19 ENCOUNTER — Telehealth: Payer: Self-pay | Admitting: *Deleted

## 2014-06-19 NOTE — Telephone Encounter (Signed)
Stop Eliquis for a week. Has he seen a gastroenterologist? It sounds probably hemorrhoidal, try anusol HC or similar OTC product for a week, then restart Eliquis.

## 2014-06-19 NOTE — Telephone Encounter (Signed)
Called patient's wife - per Dr. Loletha Grayer Hold Eliquis x 1 week/then restart - bleeding sounds like it is coming from hemorrhoids - use hemorrhoid preparation.  Needs to see GI specialist if he doesn't have one.  Will let me know.

## 2014-06-19 NOTE — Telephone Encounter (Signed)
Call received from Mrs. Bubba Camp and interpreter.  Mr. Heindel has bright red blood in his stool again this AM.  Restarted Eliquis Saturday after holding x 3 days due to bloody stools.  Will send to Dr. Loletha Grayer for review and advise.  Told wife I will call back before his next dose.

## 2014-06-25 ENCOUNTER — Telehealth: Payer: Self-pay | Admitting: Cardiovascular Disease

## 2014-06-25 ENCOUNTER — Other Ambulatory Visit: Payer: Self-pay | Admitting: *Deleted

## 2014-06-25 DIAGNOSIS — D649 Anemia, unspecified: Secondary | ICD-10-CM

## 2014-06-25 LAB — CBC
HEMATOCRIT: 40.7 % (ref 39.0–52.0)
Hemoglobin: 13.7 g/dL (ref 13.0–17.0)
MCH: 31 pg (ref 26.0–34.0)
MCHC: 33.7 g/dL (ref 30.0–36.0)
MCV: 92.1 fL (ref 78.0–100.0)
MPV: 9.5 fL (ref 8.6–12.4)
Platelets: 191 10*3/uL (ref 150–400)
RBC: 4.42 MIL/uL (ref 4.22–5.81)
RDW: 14.7 % (ref 11.5–15.5)
WBC: 7.1 10*3/uL (ref 4.0–10.5)

## 2014-06-25 NOTE — Telephone Encounter (Signed)
Future CBC order released for lab.

## 2014-06-26 ENCOUNTER — Telehealth: Payer: Self-pay | Admitting: Cardiovascular Disease

## 2014-06-26 DIAGNOSIS — K921 Melena: Secondary | ICD-10-CM

## 2014-06-26 NOTE — Telephone Encounter (Signed)
Returned call to patient/wife thru interpreter line. Left message with the information provided by [facility]. Reiterated necessity to seek GI evaluation and provided names of Dr. Earlean Shawl and Dr. Watt Climes (if patient does not have GI doctor).

## 2014-06-26 NOTE — Telephone Encounter (Signed)
[name] is returning Jenna's call   Thanks

## 2014-06-26 NOTE — Telephone Encounter (Signed)
Pt really needs to get in touch with GI.  If bleeding is bright red, could be sign of hemorrhoids or rectal tear from straining.  If has constipation problems might try adding stool softener daily.  Would suggest he hold Eliquis for 2 days to see if bleeding stops, but beyond that if it's not a hemorrhoid or tear, GI needs to diagnose him.

## 2014-06-26 NOTE — Telephone Encounter (Signed)
Received call from patient's wife using interpreter service. Patient was having bloody stools last week and was instructed to hold Eliquis x7 days and see a GI doctor. Patient reports he is having bloody stools since resuming Eliquis. Patient has NOT seen GI doctor. Patient/wife report big blood clots in stool. Patient had CBC yesterday and all labs values WNL.   Will defer to Dr. Sallyanne Kuster and Erasmo Downer to advise.   Wife said is OK to leave VM with instructions or best to call after 3pm

## 2014-06-26 NOTE — Telephone Encounter (Signed)
Spoke with patient's wife and she voiced understanding of instructions to hold Eliquis another 2 days. She reports patient's stool is soft and he is not straining. She describes clots in his stool that look like jelly beans.   Spoke with another nurse and it was recommended that patient see someone with [facility] - referral placed. Patient was recommended to see GI doctor last week and never did this.   Wife then states the bleeding hasn't happened in a few days but then states the bleeding hasn't occurred since last week.   Informed wife that urgent referral for [facility] was placed and the phone number and address was provided. Wife will call their office

## 2014-06-26 NOTE — Telephone Encounter (Signed)
Spoke with patient/wife/interpreter. They are waiting to her what they should do. Patient feels "fatigued everywhere" and feels weak. He denies SOB. He denies lightheadedness/dizziness.   BP/HR checked while on the phone - BP 144/77, HR 66  No current bleeding issues. Patient has not taken any more Eliquis

## 2014-06-27 ENCOUNTER — Encounter: Payer: Self-pay | Admitting: Physician Assistant

## 2014-07-03 NOTE — Consult Note (Signed)
PATIENT NAME:  Thomas [name], Thomas [name] MR#:  [id number] DATE OF BIRTH:  06-06-1934  DATE OF CONSULTATION:  11/20/2011  REFERRING PHYSICIAN:  Dustin Flock, MD    CONSULTING PHYSICIAN:  Muhammad A. Fletcher Anon, MD  PRIMARY CARE PHYSICIAN: Irven Easterly. Kary Kos, MD   PRIMARY CARDIOLOGIST: [facility] in [place]: Congestive heart failure.   HISTORY OF PRESENT ILLNESS: The patient is a 80 year old white male who is deaf. History was obtained with the help of an interpreter. He has known history of chronic systolic heart failure, coronary artery disease, atrial fibrillation and pacemaker placement. Most recent cardiac evaluation that I was able to find in the inpatient records at [facility] included cardiac catheterization in 2009 which showed significant underlying three-vessel coronary artery disease with patent LIMA to LAD and saphenous vein graft to IMP. Saphenous vein graft to RCA was found to be occluded. Ejection fraction was 40%. Medical therapy was recommended. The patient presented with increased shortness of breath and orthopnea. He also reports having chest pain with activities which has been going on for at least 3 to 4 months. This usually responds to nitroglycerin spray. He reports having back pain a few weeks ago, and he went to an urgent care facility. He was prescribed Percocet, etodolac, and Flexeril as well as meloxicam. Since he started these medications, he noticed increased dyspnea. The patient was given here IV furosemide and had significant improvement in his symptoms. He feels back to his baseline at this time. He denies any chest pain currently.   PAST MEDICAL HISTORY:  1. Coronary artery disease, status post CABG in 1986. Most recent cardiac catheterization in 2009 as outlined above with an occluded saphenous vein graft to RCA.  2. Ischemic cardiomyopathy with most recent ejection fraction of 40% reported in 2009. Currently it is 30 to 35%.   3. Chronic atrial fibrillation on long-term anticoagulation.  4. Status post pacemaker placement.  5. History of hydronephrosis.  6. Peptic ulcer disease.  7. Abdominal aortic aneurysm.  8. Epididymitis.   ALLERGIES: No known drug allergies.   HOME MEDICATIONS:  1. Atenolol 50 mg daily.  2. Cimetidine 20 mg daily.  3. Hydrochlorothiazide 12.5 mg once daily.  4. Isosorbide mononitrate 60 mg daily.  5. Nitroglycerin spray as needed.  6. Simvastatin 20 mg daily.  7. Warfarin 5 mg once daily.   SOCIAL HISTORY: Negative for smoking, alcohol or recreational drug use. The patient lives with his wife.   FAMILY HISTORY: Negative for premature coronary artery disease.   REVIEW OF SYSTEMS: A 10-point review of systems was performed, and it is negative other than what is mentioned in the history of present illness.   PHYSICAL EXAMINATION:  GENERAL: The patient appears to be at his stated age in no acute distress.   VITAL SIGNS: Temperature 98.1, pulse 71, respiratory rate 18, blood pressure is 143/84, and oxygen saturation is 94% on room air.   HEENT: Normocephalic, atraumatic.   NECK: No jugular venous distention or carotid bruits.   RESPIRATORY: Normal respiratory effort with no use of accessory muscles. Auscultation reveals a few crackles at the base.   CARDIOVASCULAR: Normal PMI. Normal S1 and S2 with no gallops or murmurs.   ABDOMEN: Benign, nontender, and nondistended.   EXTREMITIES: Trace edema bilaterally.  LABORATORY, DIAGNOSTIC AND RADIOLOGIC DATA: His BNP was elevated at 3233. Creatinine was 1.19. Troponin was 0.08 and subsequently went down to 0.05. Hemoglobin was normal. INR was 1.5. ECG showed atrial fibrillation  with ventricular paced rhythm. Chest x-ray showed cardiomegaly with pulmonary vascular congestion.   IMPRESSION:  1. Acute on chronic systolic heart failure.  2. Mildly elevated cardiac enzymes likely due to supply/demand ischemia with a known history of  coronary artery disease.  3. Stable Class II angina.  4. Chronic atrial fibrillation with subtherapeutic anticoagulation.  5. Status post permanent pacemaker placement.   RECOMMENDATION: The patient appears to have decompensation of heart failure with evidence of fluid overload. It is possible that decompensation was caused by recent nonsteroidal anti-inflammatory medications. He improved quickly with IV furosemide. I think he will need a maintenance dose of oral furosemide upon discharge, likely 40 mg once daily. I also recommend switching his atenolol to Carvedilol, which should work better with this degree of cardiomyopathy. His ejection fraction was noted to be 30 to 35 percent. The minimal elevation in troponin level is likely due to supply-demand ischemia from congestive heart failure. However, underlying progression of coronary artery disease cannot be entirely excluded. Thus, I recommend evaluation with a Lexiscan nuclear stress test. This can be done as an outpatient with his cardiologist in [place]. Given that he seems to be pacemaker-dependent, and with his decrease in ejection fraction, consideration should be for upgrading his device to a biventricular pacemaker.  ____________________________ Mertie Clause Fletcher Anon, MD maa:cbb D: 11/20/2011 19:04:47 ET T: 11/21/2011 11:53:41 ET JOB#: [id number]  cc: Muhammad A. Fletcher Anon, MD, <Dictator> Irven Easterly. Kary Kos, [facility] in [place], [place]  [place] Jury Ferne Reus MD ELECTRONICALLY SIGNED 12/18/2011 13:15

## 2014-07-03 NOTE — H&P (Signed)
PATIENT NAME:  Thomas [name], Thomas [name] MR#:  [id number] DATE OF BIRTH:  1934-07-09  DATE OF ADMISSION:  11/19/2011  PRIMARY MD: [name], MD   ED REFERRING DOCTOR: Dr. Corky [name]    CHIEF COMPLAINT: Shortness of breath.   HISTORY OF PRESENT ILLNESS: The patient is a 80 year old white male with history of coronary artery disease, history of atrial fibrillation status post pacemaker placement who is chronically deaf. He presents with complaint of shortness of breath ongoing for about one week. The patient reports that he started having back pain and was seen at an urgent care facility and was prescribed Percocet, Etodolac, Flexeril, and Meloxicam. He was started on these treatments and he reports that since starting these he took them for a few days and he started developing shortness of breath. The patient reports that he gets short of breath with any exertion. He, otherwise, has not had any chest pain. The patient reports that he sleeps on a single pillow. Does not have any nocturnal dyspnea. He is not sure if his legs are swollen but on examination he does have 1+ edema. The patient, otherwise, has chronic intermittent cough but that has not been worse. He has not had any wheezing. No fevers or chills. Denies any abdominal pain, nausea, vomiting, or diarrhea. Denies any urinary symptoms. Denies any calf pain.   PAST MEDICAL HISTORY:  1. History of left hydroureter, hydronephrosis, and obstruction of the left UV junction due to BPH status post TURP in 2009.  2. Coronary artery disease, status post CABG in 1986.  3. Status post pacemaker placement.  4. Chronic atrial fibrillation.  5. History of peptic ulcer disease with bleeding in the 1980's. No problems since. 6. History of abdominal aortic aneurysms. Details are not available.  7. History of epididymitis requiring hospitalization in 07/2011 associated with testicular swelling.   PAST SURGICAL HISTORY:  1. Status post pacemaker placement. 2. Status  post TURP.   ALLERGIES: Allergies to no medications.   OUTPATIENT MEDICATIONS:  1. Atenolol 50 daily.  2. Famotidine 20 daily.  3. Hydrochlorothiazide 12.5/100 daily.  4. Isosorbide mononitrate 60 daily.  5. Nitroglycerin 0.4 sublingual p.r.n.  6. Simvastatin 20 daily.  7. Warfarin 5 mg daily.   SOCIAL HISTORY: The patient is a nonsmoker, nonalcoholic. Currently lives with his wife.   FAMILY HISTORY: Significant for arthritis in his mother.   REVIEW OF SYSTEMS: CONSTITUTIONAL: Denies any fevers, fatigue, weakness. No weight loss. No weight gain. Does have some back pain. EYES: No blurred or double vision. No pain. No redness. No inflammation. No glaucoma. No cataracts. ENT: No tinnitus. No ear pain. Has chronic hearing loss and uses interpreter to communicate. No allergies, seasonal or year-round allergies. No epistaxis. No difficulty swallowing. RESPIRATORY: Has intermittent cough. No wheezing. No hemoptysis. No COPD. No pneumonia. CARDIOVASCULAR: Denies any chest pain. Denies any orthopnea. Does have edema. Has chronic atrial fibrillation. Complains of dyspnea on exertion. No syncope. GI: No nausea, vomiting, diarrhea. No abdominal pain. No hematemesis. No melena. No rectal bleeding. GU: Denies any dysuria, hematuria, renal calculus, or frequency. ENDOCRINE: Denies any polyuria, nocturia, or thyroid problems. HEME/LYMPH: Denies anemia, easy bruisability, or bleeding. SKIN: No acne. No rash. No changes in mole, hair or skin. MUSCULOSKELETAL: Complains of some back pain. No gout. NEUROLOGIC: No numbness. No CVA. No TIA. No seizures. PSYCHIATRIC: No anxiety. No insomnia. No ADD.   PHYSICAL EXAMINATION:   GENERAL: The patient is an obese Caucasian male. Currently does not appear to be in any  distress.   HEENT: Head atraumatic, normocephalic. Pupils equally round, reactive to light and accommodation. There is no conjunctival pallor. No scleral icterus. Nasal exam shows no drainage or ulceration.  Oropharynx is clear without any exudates.   NECK: No thyromegaly. No carotid bruits.   CARDIOVASCULAR: Regular rate and rhythm. No murmurs, rubs, clicks, or gallops. PMI is not displaced.   LUNGS: He has occasional crackles at the bases.   ABDOMEN: Soft, nontender, nondistended. Positive bowel sounds x4. There is no hepatosplenomegaly.   EXTREMITIES: 1+ edema.   SKIN: No rash.   LYMPHATICS: No lymph nodes palpable.   VASCULAR: Good DP, PT pulses.   PSYCHIATRIC: Not anxious or depressed.   NEUROLOGICAL: Awake, alert, oriented x3. Cranial nerves II through XII grossly intact. No focal deficits.   EVALUATIONS IN THE ED: Glucose 106. BNP 3233. BUN 23, creatinine 1.30, sodium 141, potassium 4.6, chloride 110, CO2 25. CPK 169. CK-MB 3.8. Troponin 0.08. WBC 7.6, hemoglobin 14.5, platelet count 177.   Chest x-ray shows cardiomegaly with increase in pulmonary vascular markings consistent with possible congestive heart failure.   Urinalysis shows 1+ blood, nitrites negative, leukocytes negative.   EKG shows paced rhythm.   ASSESSMENT AND PLAN: The patient is a 80 year old Caucasian male who is chronically deaf who developed back pain about two weeks ago, went to urgent care and given some Percocet, etodolac, Flexeril, and meloxicam. He reports that since starting the etodolac he started having shortness of breath.  1. Shortness of breath, possibly due to acute systolic CHF, type unknown. At this time will place him on IV Lasix. Get echocardiogram of the heart. Follow his ins and outs. Will have Cardiology evaluate the patient as well.  2. Elevated cardiac enzymes. Follow serial cardiac enzymes. Cardiology evaluation. May need to obtain records from his primary cardiologist in [place].  3. Peptic ulcer disease. Will place him on PPI.  4. Atrial fibrillation, on chronic Coumadin. INR is subtherapeutic. I will go ahead and increase his Coumadin dose.  5. Hypertension. Will continue atenolol,  losartan, and Imdur. Will hold HCTZ in light of him being on IV Lasix.  6. Miscellaneous. The patient is already on Coumadin for DVT prophylaxis.    TIME SPENT: 40 minutes.   ____________________________ Lafonda Mosses Posey Pronto, MD shp:drc D: 11/19/2011 19:33:14 ET T: 11/20/2011 06:37:50 ET JOB#: [id number]  cc: Alaijah Gibler H. Posey Pronto, MD, <Dictator> Irven Easterly. Kary Kos, MD Alric Seton MD ELECTRONICALLY SIGNED 11/20/2011 18:12

## 2014-07-03 NOTE — Discharge Summary (Signed)
PATIENT NAME:  Thomas [name], Thomas [name] MR#:  [id number] DATE OF BIRTH:  [birth year]  DATE OF ADMISSION:  11/19/2011 DATE OF DISCHARGE:  11/21/2011  DISCHARGE DIAGNOSES:  1. Possible acute systolic congestive heart failure exacerbations. The patient's ejection fraction is 30%.  2. Elevated enzymes due to mild ischemia.  3. Peptic ulcer disease. 4. Atrial fibrillation, on chronic Coumadin anticoagulation.  5. Hypertension.  6. The patient is deaf.   FOLLOW UP:  1. Follow up with cardiologist in [place] in 1 to 2 weeks after discharge.  2. Follow up with primary care physician, Dr. Jarome Lamas, in 1 to 2 weeks after discharge.  3. PT-INR checkup in 3 to 4 days.   DIET: Low sodium.   ACTIVITY: As tolerated.   DISCHARGE MEDICATIONS:  1. Losartan 50 mg daily.  2. Lasix 40 mg daily.  3. Coreg 6.25 mg b.i.d.  4. Coumadin 7.5 mg daily.  5. Pepcid 20 mg daily.  6. Nitroglycerin p.r.n.  7. Imdur 60 mg daily.  8. Simvastatin 20 mg daily.   CONSULTATION: Cardiology consultation with Dr. Fletcher Anon.   LABORATORY, DIAGNOSTIC AND RADIOLOGICAL DATA: A 2-D echo showed technically difficult, moderate-to-severely reduced left ventricular systolic function, ejection fraction 30 to 35%, moderate concentric left ventricular hypertrophy. The left atrium is moderately dilated. Mild MR, mild AR. Chest x-ray shows some pulmonary vascular congestion.  INR 1.5. CBC normal. BNP 3233 on admission. Complete metabolic panel essentially normal. Cholesterol profile normal. Cardiac enzymes ranged from 0.08 to 0.05.   HOSPITAL COURSE: The patient is a 80 year old male who is deaf, with history of chronic atrial fibrillation on Coumadin, status post pacemaker, coronary artery disease, status post coronary artery bypass graft, peptic ulcer disease, presented with shortness of breath. He was found to have acute systolic congestive heart failure exacerbation. He was diuresed with Lasix with good results. An echo was done which showed  ejection fraction of 30 to 35%. He also had minimally elevated liver function tests; therefore, a Cardiology consultation with Dr. Fletcher Anon was obtained, who recommended diuresis with switching the patient over to Lasix and outpatient stress test with his primary cardiologist in [place]. The patient was also treated with aspirin, statin, Imdur and beta blocker. His beta blocker was switched from atenolol to Coreg. The patient's INR was not therapeutic, therefore, the dose of his Coumadin has been increased. He will need a PT-INR check-up in 3 to 4 days. His hypertension remained well controlled after initiation of Lasix. The patient had symptomatic improvement. He is being discharged home in stable condition.   TIME SPENT: 45 minutes.  ____________________________ Cherre Huger, MD sp:cbb D: 11/21/2011 14:32:56 ET T: 11/23/2011 13:18:31 ET JOB#: [id number]  cc: Cherre Huger, MD, <Dictator> Irven Easterly. Kary Kos, MD Cherre Huger MD ELECTRONICALLY SIGNED 11/23/2011 15:20

## 2014-07-03 NOTE — Consult Note (Signed)
Brief Consult Note: Diagnosis: CHF: acute on chronic (EF 30-35%), CAD s/p CABG, A-fib on anticogulation.   Patient was seen by consultant.   Consult note dictated.   Comments: The patient sees [facility] in [place].  Most recent cardiac evaluation that I found on [facility] record was in 2009. He had a cardiac cath at that time which showed patent LIMA to LAD and SVG to OM. SVG to RCA was occluded. EF was 40%.   EF appears worse now. Recommend:   Can change Lasix to PO (40 mg once daily) I changed Atenolol to Carvedilol.  Continue Losartan.  I recommend a Lexiscan nuclear stress test which can be as an outpatient with his cardiologist.  Electronic Signatures: Kathlyn Sacramento (MD)  (Signed 06-Sep-13 18:56)  Authored: Brief Consult Note   Last Updated: 06-Sep-13 18:56 by Kathlyn Sacramento (MD)

## 2014-07-07 NOTE — Consult Note (Signed)
PATIENT NAME:  Thomas [name], Thomas [name] MR#:  [id number] DATE OF BIRTH:  Oct 08, 1934  DATE OF CONSULTATION:  02/14/2014  PRIMARY CARE PHYSICIAN:  Irven Easterly. Kary Kos, MD  REQUESTING PHYSICIAN:   Jon Gills. Reita Cliche, MD  CONSULTING PHYSICIAN:  Alden Feagan S. Manuella Ghazi, MD  CHIEF COMPLAINT: Rectal bleed.   HISTORY OF PRESENT ILLNESS: The patient is a 80 year old male with a known history of GERD, hypertension, hyperlipidemia, and coronary artery disease, status post stenting, was seen in consultation for rectal bleed. The patient came ambulatory to triage here at [facility] this morning around 5:00 without any acute distress or difficulty. He reported that he woke up around 2:30 in the morning with mild abdominal cramping and passed small amount of rectal blood x 1.  He has not had anything after that, but he was just concerned and decided to come to the Emergency Department.  The patient is deaf and most of the history is obtained through interpreter for sign language along with Emergency Department physicians and medical records. The patient's wife was also at the bedside. The patient denies any symptoms at this time, nor has he had any further bleeding. He denies any abdominal pain. He denies any nausea, vomiting, diarrhea, or constipation. He denies any fever.   PAST MEDICAL HISTORY: 1.  BPH.  2.  Coronary artery disease, status post coronary artery bypass graft.  3.  Chronic atrial fibrillation. 4.  History of peptic ulcer disease.  5.  History of abdominal aortic aneurysm.  6.  History of epididymitis.   PAST SURGICAL HISTORY:   1.  Pacemaker placement.  2.  TURP.   ALLERGIES: No known drug allergies.   SOCIAL HISTORY: No smoking or alcohol. He lives with his wife.   FAMILY HISTORY: Positive for arthritis in his mother.   MEDICATIONS AT HOME: 1.  Vitamin B12 once daily. 2.  Aspirin 81 mg p.o. daily. 3.  Coreg 12.5 mg p.o. daily.  4.  Lasix 40 mg p.o. daily.  5.  Isosorbide mononitrate 90 mg p.o.  daily.  6.  Losartan 50 mg p.o. daily.  7.  Nitroglycerin 0.4 mg sublingual daily as needed for chest pain. 8.  Simvastatin 20 mg p.o. daily.  9.  Warfarin 4 mg p.o. daily.   REVIEW OF SYSTEMS:   CONSTITUTIONAL: No fever, fatigue, weakness.  EYES: No blurred or double vision.  ENT: No tinnitus or ear pain.  RESPIRATORY: No cough, wheezing, hemoptysis.  CARDIOVASCULAR: No chest pain, orthopnea, edema.  GASTROINTESTINAL: No nausea, vomiting. He had one episode of a small amount of rectal bleed. Had mild abdominal cramp right before that rectal bleed, none since then. GENITOURINARY: No dysuria or hematuria.  ENDOCRINE: No polyuria or nocturia.  HEMATOLOGY: No anemia or easy bruising.  SKIN: No rash or lesion.  MUSCULOSKELETAL: No history of arthritis or muscle cramp.  NEUROLOGIC: No tingling, numbness, weakness.  PSYCHIATRY: No history of anxiety or depression.   PHYSICAL EXAMINATION: VITAL SIGNS: Temperature 97.9, heart rate 66 per minute, respirations 18 per minute, blood pressure 155/94. He was saturating 95% on room air.  GENERAL: The patient is a 80 year old male lying in the bed comfortably without any acute distress.  HEENT:  Eyes, pupils equal, round, reactive to light and accommodation. No scleral icterus. Extraocular muscles intact. Head atraumatic, normocephalic. Oropharynx and nasopharynx clear.  NECK: Supple. No jugular venous distention. No thyroid enlargement or tenderness.  LUNGS: Clear to auscultation bilaterally. No wheezing, rales, rhonchi, or crepitation.  CARDIOVASCULAR: S1, S2 normal. No murmurs,  rubs, or gallop. ABDOMEN: Soft, nontender, nondistended. Bowel sounds present. No organomegaly or mass.  EXTREMITIES: No pedal edema, cyanosis, or clubbing.  NEUROLOGIC: Cranial nerves II through XII intact. Muscle strength 5/5 in all extremities. Sensation intact. PSYCHIATRY:  The patient is alert and oriented x 3.  SKIN: No obvious rash, lesions.  MUSCULOSKELETAL: No  joint effusion or tenderness.   LABORATORY DATA: Normal CBC. Normal BMP. Normal liver function tests. Normal first set of troponin. Coagulation panel showed PT of 21.4, INR 1.9.   IMAGING:  CT scan of the abdomen and pelvis with contrast in the Emergency Department showed no acute process in the abdomen or pelvis. Cardiomegaly with elevated right heart pressure. Hepatomegaly. Scarred right kidney.  IMPRESSION AND PLAN: 1.  Rectal bleed with no hemodynamic compromise and no clinical signs of decompensation. His hemoglobin is very stable. His vitals are stable. This certainly could represent small diverticular bleed which usually always stops on its own with conservative means.  I have requested him to stop aspirin and warfarin at this time until he sees his primary care physician. He already has a scheduled appointment with Dr. Kary Kos on coming Monday at 9:30 in the morning which I have asked him to make sure he follows through.  I also discussed the case with Dr. Candace Cruise from gastroenterology who was on call, and he agreed with the above management. He certainly did not feel this patient would need colonoscopy at this time, especially him being on warfarin and with his hemoglobin being stable.  I have discussed the management plan with the patient and his wife at bedside with the help of interpreter and they are in complete agreement with this and agreeable to go home and follow up outpatient with his primary physician. I have explained to the patient that he certainly could have some lingering bleeding in a small amount and some dark tarry stool for the next couple bowel movements a few days, this considered normal clotting process so not to be concerned about same. Unless he gets really symptomatic and dizzy or has active, fresh red blood per rectum, then to return to Emergency Department or call his primary care physician. I have also explained to the patient his risk of being off anticoagulation, especially  aspirin and warfarin, considering his history of cardiac disease and atrial fibrillation, but his INR is 1.9 so at this time considering his risk of bleeding being higher, I recommended him to hold both warfarin and aspirin at least until Monday.   I have discussed the case with the ED physician, Dr. Lisa Roca, who was somewhat concerned about letting him go, although considering that concerned I have offered to recheck the hemoglobin which again came back about the same after 2 hours wait time and she also felt need for rechecking of troponin, which was also unchanged. The patient will be discharged home with outpatient followup with his primary care physician and gastroenterology, Dr. Candace Cruise.   2.  Hypertension. Continue home medication.  3.  Chronic atrial fibrillation, holding warfarin at this time along with aspirin, and this can be resumed after he sees his primary care physician on Monday. Risks and benefits were explained of holding those medications.  4.  Peptic ulcer disease. He was recommended to continue proton pump inhibitor.   CODE STATUS: Full code.   TOTAL TIME TAKING CARE OF THIS PATIENT: 55 minutes.   ____________________________ Lucina Mellow. Manuella Ghazi, MD vss:LT D: 02/14/2014 19:38:17 ET T: 02/14/2014 20:05:48 ET JOB#: [id number]  cc: Mckaylin Bastien S. Manuella Ghazi, MD, <Dictator> Irven Easterly. Kary Kos, MD Mertie Clause. Fletcher Anon, MD Lupita Dawn. Candace Cruise, MD Remer Macho MD ELECTRONICALLY SIGNED 02/17/2014 15:42

## 2014-07-08 NOTE — Consult Note (Signed)
Pt seen, Chart Reviewed, Films Reviewed, Note Dictated  Acute Right Epididymorchitis, Acute Cystitis, BPH  The urine is consistant with a probable urinary teact infection but unfortunatly no culture was obtained.  2 CT's have beeb obtained with 72 hrs as well as a scrotal U/S which are all consistant with a bladder infection and resultant epididymorchitis.  There is no evidence of sepsis.  He is afebrile with mildly elevated WBC. All he needs is Abx therapy.  Cipro 750 mg is preferred since this gets into the testicle well.  Bactrim would be the other option. Both can effect the anticoagulation.  The coumadin dose could be cut in half and monitored during treatment.  He will need a 10 day course. Keep the testicle elevated on a towel or jock strap. It will take 1-2 weeks for resolution of the swelling and changes.  F/U as outpatient in 10 to 14 days is all that is otherwise needed.  The bladder appears to be emptying adequatly. Please call if other issues arrise.  Electronic Signatures: Murrell Redden (MD)  (Signed on 01-May-13 17:03)  Authored  Last Updated: 01-May-13 17:03 by Murrell Redden (MD)

## 2014-07-08 NOTE — Consult Note (Signed)
PATIENT NAME:  Thomas [name], Thomas [name] MR#:  [id number] DATE OF BIRTH:  08-23-1934  DATE OF CONSULTATION:  07/15/2011  REFERRING PHYSICIAN:   CONSULTING PHYSICIAN:  Denice Bors. Jacqlyn Larsen, MD  HISTORY: Thomas [name] is a 80 year old gentleman who has a history of prostatic hypertrophy. He has undergone evaluation in the past by Dr. Maryan Puls. He underwent a KTP laser TURP in March 2009. He has had no recent urological follow-up. He states he was doing well until approximately three days prior. He was having no difficulty with urination. He was having no burning with urination or increased frequency. He developed a temperature to 102 on an outpatient basis. He initially presented to the Emergency Room. He was placed on a course of Cipro. Unfortunately, no urine cultures were obtained at his initial presentation. A scrotal ultrasound was obtained demonstrating findings consistent with epididymitis. A CT scan also demonstrated thickening of the bladder consistent with probable urinary tract infection as well as secondary changes related to prostatic hypertrophy. He was discharged from the Emergency Room, returning approximately 48 hours later with persistent fever and testicular pain. A repeat CT scan was obtained demonstrating no new findings. His temperature, however, has been afebrile throughout his entire hospitalization. After presentation to the Emergency Room, his initial presenting white blood cell count was 12.8. It had risen to 14.8 at the time of initial presentation for hospitalization. On examination his findings are consistent with right epididymal orchitis. He seems to be responding appropriately to IV antibiotic therapy. There is no current indication for urinary retention. There had been communication issues with the patient and his family due to a history of deafness. Much of the conversation was obtained through writing. There was a subsequent delay in getting a Optometrist. This, however, was obtained  completing the questioning and information.   PAST MEDICAL HISTORY:  1. Prostatic hypertrophy as stated above.  2. Coronary artery disease. 3. History of left hydroureter and hydronephrosis related to prostatic hypertrophy and bladder outlet obstruction.  4. Abdominal aortic aneurysm.   PAST SURGICAL HISTORY:  1. KTP laser TURP in 2009.  2. Pacemaker placement.   ALLERGIES: No known drug allergies.   MEDICATIONS ON ADMISSION:  1. Vicodin as needed. 2. Atenolol 50 mg daily. 3. Famotidine 20 mg daily.  4. Hydrochlorothiazide/Losartan 12.5/100 mg daily.  5. Isosorbide 30 mg daily.  6. Nitroglycerin as needed. 7. Coumadin 5 mg daily.  8. Zocor 20 mg daily.   PHYSICAL EXAMINATION:  VITAL SIGNS: Afebrile, vital signs stable.   HEENT: Within normal limits.   CHEST: Clear to auscultation bilaterally.   CARDIOVASCULAR: Regular rate and rhythm.   ABDOMEN: Soft, nontender, nondistended. No palpable masses. No appreciable CVA tenderness.   GU: External genitalia demonstrates a normal phallus. The right hemiscrotum is swollen and enlarged with no evidence of abscess. The left hemiscrotum is relatively normal. The right testicle is approximately triple in size with no areas of fluctuance. It is warm and erythematous. The left testicle is within normal limits.   EXTREMITIES: Free range of motion x4.   NEUROLOGIC: Motor and sensory are grossly intact.   ASSESSMENT:  1. Right epididymoorchitis.  2. Acute cystitis.  3. Prostatic hypertrophy.   RECOMMENDATIONS: There is no evidence of urinary retention as the source of infection. He appears to be emptying adequately. This may be a solitary event. There is, however, a chance of future recurrence. This will need some degree of monitoring. All that is indicated at present is antibiotic therapy, Cipro or Bactrim or  the most appropriate antibiotics for this purpose. Unfortunately, no culture was obtained for confirmation of appropriate  antibiotic therapy. With his Coumadin use, monitoring of the Coumadin and reduction in the Coumadin dosing will be necessary. He should be on Cipro 750 mg for a 10 day course. It will take 7 to 14 days for reduction in testicular swelling. There will be more rapid reduction in the tenderness. He should utilize a scrotal support when ambulating. When recumbent, elevation of the testicle on a towel will be most appropriate. There is no current evidence of abscess. Monitoring is all that is indicated for now. He should follow up in 10 to 14 days as an outpatient for re-evaluation. If there are any further questions, please free to contact us.   ____________________________ Denice Bors. Jacqlyn Larsen, MD bsc:ap D: 07/16/2011 07:59:18 ET T: 07/16/2011 10:57:36 ET JOB#: [id number]  cc: Denice Bors. Jacqlyn Larsen, MD, <Dictator>  Denice Bors Sarim Rothman MD ELECTRONICALLY SIGNED 07/27/2011 8:15

## 2014-07-08 NOTE — H&P (Signed)
PATIENT NAME:  Thomas [name], DENARDO MR#:  [id number] DATE OF BIRTH:  [birth year]  DATE OF ADMISSION:  07/15/2011  SUBJECTIVE: The patient is a 80 year old male who presented to the ED with a history of right testicular swelling. The patient started noticing the swelling three days ago associated with high-grade fever and chills. He has noted temperatures up to 101 and 102 degrees Fahrenheit. His fever was 102 degrees Fahrenheit this afternoon. He also was found to have a bruise along his right flank which he sustained this morning after banging his back against the door of the grocery store. He, however, did not fall. He denies any lightheadedness. Denies any chest pain or shortness of breath. He denies any nausea, vomiting, diarrhea, blood in his stool, black tarry stool. The patient is on Coumadin currently. His INR is pending at this time. He complains of 10 out of 10 pain in his right testicle. He states that the testicle has been red and swollen for the last three days, however, the swelling has come down today. He was initiated on Ciprofloxacin p.o. In the outpatient setting he has failed therapy and has continued to spike fevers.   The patient is hard of hearing and interpreter has been used to communicate with his wife and the patient himself.   The patient does have a history of left hydroureter, hydronephrosis, and obstruction of the left ureterovesical junction, benign prostatic hypertrophy, status post TURP in 2009.   PAST MEDICAL HISTORY: 1. History of CABG. 2. Pacemaker placement. 3. Peptic ulcer disease. 4. Abdominal aortic aneurysm.  PAST SURGICAL HISTORY: 1. Pacemaker placement.  2. TURP.  FAMILY HISTORY: Significant for arthritis in mother.  SOCIAL HISTORY: The patient is a nonsmoker, nonalcoholic, and currently lives with his wife.   ALLERGIES: No known drug allergies.   CURRENT MEDICATIONS: 1. Vicodin. 2. Atenolol. 3. Ciprofloxacin. 4. Famotidine. 5. HCTZ/losartan.   6. Imdur. 7. Nitroglycerin. 8. Coumadin. 9. Zocor.   REVIEW OF SYSTEMS: CONSTITUTIONAL: As described above, fever associated with myalgias. No history of weight loss or weight gain. HEENT: Denies any blurry vision, double vision. Denies any ear pain. Hearing loss is chronic for him. RESPIRATORY: No cough. No hemoptysis. No dyspnea. CARDIOVASCULAR: Denies any orthopnea, shortness of breath, palpitations, or near syncopal episodes. GI: Denies any nausea, vomiting, or abdominal pain. GU: Complains of dysuria, testicular swelling, redness, tenderness and pain in the testicle. HEME: He has bruises along his right flank, otherwise no obvious bleeding. NEUROLOGIC: Denies any weakness, dysarthria, tremors, or vertigo.   LABORATORY, DIAGNOSTIC, AND RADIOLOGICAL DATA: Sodium 139, potassium 4.2, chloride 106, bicarb 24, glucose 102, BUN 31, creatinine 1.77, anion gap 9, WBC 14.8, hemoglobin 13.6, hematocrit 39.9. INR 2.1. Lactic acid 1.3.   ASSESSMENT AND PLAN:  1. Right testicular swelling. Differential diagnosis includes testicular torsion versus epididymitis versus testicular abscess. The patient received one dose of Zosyn in the ER. We will start the patient on Levaquin and IV Flagyl. Dr. Jacqlyn Larsen of Urology will be consulted in the morning. Testicular ultrasound has been ordered.  2. Acute on chronic renal failure with a baseline creatinine of 1.45. Creatinine is 1.77, most likely prerenal and also in the setting of use of HCTZ/losartan. His antihypertensive medications will be held. We will hydrate him with IV fluids as his blood pressure is borderline low.  3. Atrial fibrillation, on anticoagulation with Coumadin. 4. Hypertension. We will hold HCTZ/losartan. Initiate atenolol tomorrow if blood pressure tolerates.    CODE STATUS: He is a FULL CODE.  ____________________________ Reyne Dumas, MD na:drc D: 07/15/2011 02:18:29 ET T: 07/15/2011 06:53:21 ET JOB#: [id number]  cc: Reyne Dumas, MD,  <Dictator> Reyne Dumas MD ELECTRONICALLY SIGNED 08/26/2011 0:12

## 2014-07-08 NOTE — H&P (Signed)
PATIENT NAME:  Thomas [name], Thomas [name] MR#:  [id number] DATE OF BIRTH:  04-Mar-1935  DATE OF ADMISSION:  07/15/2011  ADDENDUM:  PHYSICAL EXAMINATION:   GENERAL: The patient is very uncomfortable, drooling.  VITAL SIGNS: Blood pressure 110/71, respirations 20, pulse 63, temperature 98.6.  NECK: Tender to palpation in the bilateral submandibular region. Supple. No JVD.  HEENT: Oral mucosa appears to be dry. Tongue is erythematous. Submandibular and submental lymphadenopathy bilaterally right greater than left.   LUNGS: Clear to auscultation bilaterally. No wheezes, no crackles, no rhonchi.  CARDIOVASCULAR: Regular rate and rhythm. No murmurs, rubs, or gallops.  ABDOMEN: Soft, nontender, nondistended.  EXTREMITIES: Without cyanosis, clubbing, or edema.   NEUROLOGIC: Cranial nerves II through XII grossly intact.  PSYCHIATRIC: Appropriate mood and affect.   LYMPHATIC: Submental and submandibular lymphadenopathy bilaterally.  SKIN: Without any skin rashes.   ____________________________ Reyne Dumas, MD na:drc D: 07/15/2011 08:24:37 ET T: 07/15/2011 08:47:13 ET JOB#: [id number] Reyne Dumas MD ELECTRONICALLY SIGNED 08/26/2011 0:12

## 2014-07-08 NOTE — Discharge Summary (Signed)
PATIENT NAME:  Thomas [name], Thomas [name] MR#:  [id number] DATE OF BIRTH:  Oct 24, 1934  DATE OF ADMISSION:  07/15/2011 DATE OF DISCHARGE:  07/16/2011  For a detailed note, please take a look at the history and physical done by Dr. Allyson Sabal.  DISCHARGE DIAGNOSES:  1. Acute epididymitis.  2. Left-sided testicular swelling and redness due to epididymitis.  3. Acute renal failure.  4. History of chronic atrial fibrillation.  5. Hyperlipidemia.  6. Hypertension.   DIET: Patient is being discharged on a low sodium, low fat diet.   ACTIVITY: As tolerated.   FOLLOW UP: Follow up with Dr. Edrick Oh from urology in the next 1 to 2 weeks.   DISCHARGE MEDICATIONS:  1. Ciprofloxacin 750 mg b.i.d. x10 days.  2. Sublingual nitroglycerin as needed.  3. Atenolol 50 mg daily.  4. Zocor 20 mg daily.  5. Pepcid 20 mg daily.  6. HCTZ/losartan 12.5/100, 1 tab daily.  7. Imdur 30 mg daily.  8. Tylenol 650 q.6 hours p.r.n.  9. Aleve as needed for pain. 10. Coumadin 2.5 mg to be taken while the patient is on Cipro and to be changed to 5 mg daily after the patient has finished antibiotic therapy.   [place] COURSE: Dr. Edrick Oh from urology.   BRIEF HOSPITAL COURSE: This is a 80 year old male with medical problems as mentioned above presented to the hospital on 07/15/2011 due to painful right scrotal swelling and redness.  1. Right scrotal swelling and redness, likely secondary to acute epididymitis. Prior to coming to the hospital patient had already had an ultrasound of his right testicle done on 07/11/2011 which showed that he had acute epididymitis. Patient was getting p.o. ciprofloxacin and some p.r.n. Vicodin for pain although his symptoms are not improving therefore he was brought to the hospital. Patient was started on IV antibiotic therapy with Zosyn, Flagyl and Levaquin. A urology consult was obtained and as per Dr. Jacqlyn Larsen the swelling and redness usually takes about 1 to 2 weeks to go  away. He did not recommend such as escalated antibiotic therapy and recommended putting the patient on ciprofloxacin 750 b.i.d. which is what I discharged him on. He will have a follow up with Dr. Jacqlyn Larsen coming up in the next 10 to 14 days. He was advised while he was ambulating to keep his testes elevated.  2. Acute renal failure. I do not have a baseline creatinine to compare with. Patient's creatinine was 1.4 on admission. It escalated up to as high as 1.7. After some IV fluids, his creatinine did come down to 1.4. He was urinating well with no evidence of urinary obstruction. Patient's HCTZ and ACE were held although he can resume that upon discharge.  3. Hypertension. Patient remained hemodynamically stable during the hospital course. His antihypertensives initially were held due to some hypotension although since his blood pressure has improved with some fluids, he can resume his antihypertensives including atenolol and HCTZ and losartan and Imdur.   4. History of chronic atrial fibrillation. Patient remained rate controlled. He will resume his atenolol. He was on Coumadin. His INR was therapeutic although his Coumadin dose has been cut in half as he is going to be on antibiotics which could affect his Coumadin dosing.   CODE STATUS: Patient is a FULL CODE.   TIME SPENT WITH DISCHARGE: 40 minutes.   ____________________________ Belia Heman. Verdell Carmine, MD vjs:cms D: 07/16/2011 15:56:42 ET T: 07/17/2011 10:38:06 ET JOB#: [id number]  cc: Belia Heman. Verdell Carmine, MD, <Dictator> Aaron Edelman  Bjorn Loser, MD Irven Easterly. Kary Kos, MD Henreitta Leber MD ELECTRONICALLY SIGNED 07/24/2011 18:48

## 2014-07-11 ENCOUNTER — Ambulatory Visit: Payer: No Typology Code available for payment source | Admitting: Physician Assistant

## 2014-07-12 ENCOUNTER — Telehealth: Payer: Self-pay | Admitting: Cardiovascular Disease

## 2014-07-12 NOTE — Telephone Encounter (Signed)
Pt. Has appt. With GI doctor may 19th, states he had some blood clots in his stool today. Pt. Instructed to continue with eliquis but if bleeding gets worse to let us know and to call the gI doctor, pt. Agreed with plan. I considered this to be the best plan of action since it sounds like this is old blood from higher up in the GI tract.

## 2014-07-12 NOTE — Telephone Encounter (Signed)
If he only sees a small amount of blood associated with a normal bowel movement, this most likely represents hemorrhoidal bleeding, which he has had before. This is common with anticoagulants such as Eliquis. Stop Eliquis if he develops frank rectal bleeding not associated with a normal bowel movement

## 2014-07-12 NOTE — Telephone Encounter (Signed)
Will do, and thank you

## 2014-07-12 NOTE — Telephone Encounter (Signed)
Pt's wife called in stating that the pt was having some blood of his stool and she would like to know what she should do. Please call  Thanks

## 2014-07-23 ENCOUNTER — Telehealth: Payer: Self-pay | Admitting: Cardiovascular Disease

## 2014-07-23 ENCOUNTER — Ambulatory Visit (INDEPENDENT_AMBULATORY_CARE_PROVIDER_SITE_OTHER): Payer: PPO | Admitting: *Deleted

## 2014-07-23 DIAGNOSIS — I255 Ischemic cardiomyopathy: Secondary | ICD-10-CM

## 2014-07-23 DIAGNOSIS — I5022 Chronic systolic (congestive) heart failure: Secondary | ICD-10-CM

## 2014-07-23 NOTE — Telephone Encounter (Signed)
Pt wants to know if you have been receiving his information from his remote pacemaker?

## 2014-07-23 NOTE — Progress Notes (Signed)
Remote ICD transmission.   

## 2014-07-23 NOTE — Telephone Encounter (Signed)
Pt will return call later his phone is not working properly.

## 2014-07-24 NOTE — Telephone Encounter (Signed)
Pt aware that transmission received.

## 2014-07-27 LAB — CUP PACEART REMOTE DEVICE CHECK
Battery Remaining Longevity: 71 mo
Brady Statistic AP VP Percent: 0 %
Brady Statistic AP VS Percent: 0 %
Brady Statistic AS VP Percent: 99.37 %
Brady Statistic RA Percent Paced: 0 %
Date Time Interrogation Session: 20160509052305
HighPow Impedance: 76 Ohm
Lead Channel Impedance Value: 285 Ohm
Lead Channel Impedance Value: 399 Ohm
Lead Channel Impedance Value: 399 Ohm
Lead Channel Impedance Value: 399 Ohm
Lead Channel Impedance Value: 437 Ohm
Lead Channel Impedance Value: 494 Ohm
Lead Channel Impedance Value: 703 Ohm
Lead Channel Impedance Value: 722 Ohm
Lead Channel Impedance Value: 779 Ohm
Lead Channel Pacing Threshold Amplitude: 1.25 V
Lead Channel Pacing Threshold Pulse Width: 0.4 ms
Lead Channel Pacing Threshold Pulse Width: 0.6 ms
Lead Channel Sensing Intrinsic Amplitude: 17.375 mV
Lead Channel Sensing Intrinsic Amplitude: 17.375 mV
Lead Channel Setting Pacing Amplitude: 2 V
Lead Channel Setting Pacing Amplitude: 2.25 V
Lead Channel Setting Pacing Pulse Width: 0.6 ms
MDC IDC MSMT BATTERY VOLTAGE: 2.97 V
MDC IDC MSMT LEADCHNL LV IMPEDANCE VALUE: 380 Ohm
MDC IDC MSMT LEADCHNL LV IMPEDANCE VALUE: 437 Ohm
MDC IDC MSMT LEADCHNL LV IMPEDANCE VALUE: 703 Ohm
MDC IDC MSMT LEADCHNL LV IMPEDANCE VALUE: 703 Ohm
MDC IDC MSMT LEADCHNL RA SENSING INTR AMPL: 0.75 mV
MDC IDC MSMT LEADCHNL RA SENSING INTR AMPL: 0.75 mV
MDC IDC MSMT LEADCHNL RV PACING THRESHOLD AMPLITUDE: 0.625 V
MDC IDC SET LEADCHNL RV PACING PULSEWIDTH: 0.4 ms
MDC IDC SET LEADCHNL RV SENSING SENSITIVITY: 0.3 mV
MDC IDC SET ZONE DETECTION INTERVAL: 400 ms
MDC IDC STAT BRADY AS VS PERCENT: 0.63 %
MDC IDC STAT BRADY RV PERCENT PACED: 99.52 %
Zone Setting Detection Interval: 300 ms
Zone Setting Detection Interval: 350 ms
Zone Setting Detection Interval: 360 ms

## 2014-08-02 ENCOUNTER — Ambulatory Visit: Payer: No Typology Code available for payment source | Admitting: Gastroenterology

## 2014-08-03 ENCOUNTER — Encounter: Payer: Self-pay | Admitting: Cardiology

## 2014-08-07 ENCOUNTER — Encounter: Payer: Self-pay | Admitting: Internal Medicine

## 2014-08-21 ENCOUNTER — Other Ambulatory Visit: Payer: Self-pay | Admitting: Physician Assistant

## 2014-08-21 NOTE — Telephone Encounter (Signed)
Rx(s) sent to pharmacy electronically.  

## 2014-08-23 ENCOUNTER — Other Ambulatory Visit: Payer: Self-pay | Admitting: Cardiovascular Disease

## 2014-08-24 ENCOUNTER — Encounter: Payer: Self-pay | Admitting: Emergency Medicine

## 2014-08-24 ENCOUNTER — Emergency Department
Admission: EM | Admit: 2014-08-24 | Discharge: 2014-08-24 | Disposition: A | Discharge status: Home / Self Care | Payer: PPO | Attending: Emergency Medicine | Admitting: Emergency Medicine

## 2014-08-24 DIAGNOSIS — R040 Epistaxis: Secondary | ICD-10-CM

## 2014-08-24 DIAGNOSIS — N183 Chronic kidney disease, stage 3 (moderate): Secondary | ICD-10-CM | POA: Diagnosis not present

## 2014-08-24 DIAGNOSIS — I129 Hypertensive chronic kidney disease with stage 1 through stage 4 chronic kidney disease, or unspecified chronic kidney disease: Secondary | ICD-10-CM | POA: Diagnosis not present

## 2014-08-24 DIAGNOSIS — Z87891 Personal history of nicotine dependence: Secondary | ICD-10-CM | POA: Diagnosis not present

## 2014-08-24 MED ORDER — OXYMETAZOLINE HCL 0.05 % NA SOLN: 2.0000 | Freq: Two times a day (BID) | NASAL | Status: DC | PRN

## 2014-08-24 MED ORDER — OXYMETAZOLINE HCL 0.05 % NA SOLN
1.0000 | Freq: Once | NASAL | Status: AC
  Administered 2014-08-24: 1 via NASAL
  Filled 2014-08-24: qty 15

## 2014-08-24 NOTE — ED Provider Notes (Signed)
[facility] Emergency Department Provider Note     Time seen: ----------------------------------------- 11:00 AM on 08/24/2014 -----------------------------------------    I have reviewed the triage vital signs and the nursing notes.   HISTORY  Chief Complaint Epistaxis    HPI Thomas [name] is a 80 y.o. male who presents ER for intermittent nosebleeds for the last several months. Patient does not have any bleeding since her out to the ER, had her bleeding prior when he was at work. She was sent to ER by his work boss. Denies any other complaints. Also notes he's been switched from Coumadin to Eliquis several months ago. Again patient aroused without any active bleeding, is deaf, or history and symptoms are conveyed through deaf interpreter   Past Medical History  Diagnosis Date  . Coronary artery disease     a. s/p CABG 1986. b. Multiple PCIs/caths. c. 09/2013: s/p PTCA and BMS to SVG-OM.  [name] PAF (paroxysmal atrial fibrillation)   . History of bleeding peptic ulcer 1980  . History of abdominal aortic aneurysm   . History of epididymitis 2013  . Testicular swelling   . Hydronephrosis with ureteropelvic junction obstruction   . Hydroureter on left 2009  . Status post coronary artery bypass grafting 1986    LIMA to the LAD, SVG to OM, SVG to RCA  . Biventricular ICD (implantable cardioverter-defibrillator) in place 03/24/2005    Implantation of a [place], serial number [id number]  . Ischemic cardiomyopathy     a. Prior EF 30-35%, s/p BIV-ICD. b. 09/2013: EF 45-50%.  . Deaf   . CKD (chronic kidney disease), stage III   . Moderate tricuspid regurgitation   . HTN (hypertension)     Patient Active Problem List   Diagnosis Date Noted  . Benign essential HTN 04/02/2014  . Hematochezia 02/20/2014  . Moderate tricuspid regurgitation 02/20/2014  . Mobitz type II atrioventricular block 12/12/2013  . Long term current use of anticoagulant  therapy 10/18/2013  . Acute myocardial infarction, subendocardial infarction, initial episode of care 09/21/2013  . Cardiomyopathy, ischemic: Ejection fraction 45% 11/11/2012  . CAD Status post coronary artery bypass grafting: 1995 11/11/2012  . Permanent atrial fibrillation 11/11/2012  . Biventricular ICD Medtronic Viva single chamber October 2014 11/11/2012  . Chronic systolic heart failure 65/78/4696    Past Surgical History  Procedure Laterality Date  . Coronary artery bypass graft  1986  . Insert / replace / remove pacemaker    . Transurethral resection of prostate      s/p  . Cardiac catheterization  12/10/2011    SVG to OM widely patent.  LIMA to LAD patent  . 2-d echocardiogram  11/20/2011    Ejection fraction 30-35% moderate concentric left ventricular hypertrophy. Left atrium is moderately dilated. Mild MR. Mild or  . Persantine myoview  05/06/2010    Post-rest ejection fraction 30%. No significant ischemia demonstrated. Compared to previous study there is no significant change.  . Left heart catheterization with coronary/graft angiogram N/A 12/10/2011    Procedure: LEFT HEART CATHETERIZATION WITH Beatrix Fetters;  Surgeon: Sanda Klein, MD;  Location: [facility] CATH LAB;  Service: Cardiovascular;  Laterality: N/A;  . Bi-ventricular implantable cardioverter defibrillator N/A 12/16/2012    Procedure: BI-VENTRICULAR IMPLANTABLE CARDIOVERTER DEFIBRILLATOR  (CRT-D);  Surgeon: [name], MD;  Location: [facility] CATH LAB;  Service: Cardiovascular;  Laterality: N/A;  . Left heart catheterization with coronary/graft angiogram N/A 09/25/2013    Procedure: LEFT HEART CATHETERIZATION WITH CORONARY/GRAFT ANGIOGRAM;  Surgeon: Legrand Como  Ree Kida, MD;  Location[facility] CATH LAB;  Service: Cardiovascular;  Laterality: N/A;    Allergies Phenazopyridine  Social History History  Substance Use Topics  . Smoking status: Former Smoker    Quit date: 03/15/1985  . Smokeless tobacco: Not on file  .  Alcohol Use: Yes     Comment: occas.    Review of Systems Constitutional: Negative for fever. Eyes: Negative for visual changes. ENT: Positive for epistaxis Cardiovascular: Negative for chest pain. Respiratory: Negative for shortness of breath. Gastrointestinal: Negative for abdominal pain, vomiting and diarrhea. Genitourinary: Negative for dysuria. Musculoskeletal: Negative for back pain. Skin: Negative for rash. Neurological: Negative for headaches, focal weakness or numbness.  10-point ROS otherwise negative.  ____________________________________________   PHYSICAL EXAM:  VITAL SIGNS: ED Triage Vitals  Enc Vitals Group     BP 08/24/14 1047 131/77 mmHg     Pulse Rate 08/24/14 1047 88     Resp 08/24/14 1047 20     Temp 08/24/14 1047 97.8 F (36.6 C)     Temp Source 08/24/14 1047 Oral     SpO2 08/24/14 1047 99 %     Weight 08/24/14 1047 240 lb (108.863 kg)     Height 08/24/14 1047 6\' 2"  (1.88 m)     Head Cir --      Peak Flow --      Pain Score --      Pain Loc --      Pain Edu? --      Excl. in [place]? --     Constitutional: Alert and oriented. Well appearing and in no distress. Eyes: Conjunctivae are normal. PERRL. Normal extraocular movements. ENT   Head: Normocephalic and atraumatic.   Nose: No active bleeding bilaterally, fresh dried blood to left anterior nasal septum   Mouth/Throat: Mucous membranes are moist.   Neck: No stridor. Hematological/Lymphatic/Immunilogical: No cervical lymphadenopathy. Cardiovascular: Normal rate, regular rhythm. Normal and symmetric distal pulses are present in all extremities. No murmurs, rubs, or gallops. Respiratory: Normal respiratory effort without tachypnea nor retractions. Breath sounds are clear and equal bilaterally. No wheezes/rales/rhonchi. Gastrointestinal: Soft and nontender. No distention. No abdominal bruits. There is no CVA tenderness. Musculoskeletal: Nontender with normal range of motion in all  extremities. No joint effusions.  No lower extremity tenderness nor edema. Neurologic:  Normal speech and language. No gross focal neurologic deficits are appreciated. Speech is normal. No gait instability. Skin:  Skin is warm, dry and intact. No rash noted. Psychiatric: Mood and affect are normal. Speech and behavior are normal. Patient exhibits appropriate insight and judgment. ____________________________________________  ED COURSE:  Pertinent labs & imaging results that were available during my care of the patient were reviewed by me and considered in my medical decision making (see chart for details). Patient given Afrin spray bilaterally, symptoms from Eliquis likely. ____________________________________________    LABS (pertinent positives/negatives)  None RADIOLOGY  None  ____________________________________________  FINAL ASSESSMENT AND PLAN  Epistaxis secondary to Eliquis  Plan: Patient looks well, advised sent back treatment including leaning forward and holding pressure on the nose for 10 minutes after Neo-Synephrine nasal spray bilaterally. Patient is on Eliquis which is likely the problem. He has follow-up with his cardiologist next month advised him if the bleeding worsens he will have to come off the Eliquis.   Earleen Newport, MD   Earleen Newport, MD 08/24/14 [phone]

## 2014-08-24 NOTE — Discharge Instructions (Signed)

## 2014-08-24 NOTE — ED Notes (Signed)
Pt awaiting on ASL interpreter.  Pt Aware there will be about 1 hour wait.

## 2014-08-24 NOTE — ED Notes (Signed)
States he developed nose bleed  Over the past 2 days .[name] No bleeding at present

## 2014-09-08 ENCOUNTER — Other Ambulatory Visit: Payer: Self-pay | Admitting: Cardiovascular Disease

## 2014-09-10 NOTE — Telephone Encounter (Signed)
Rx(s) sent to pharmacy electronically.  

## 2014-09-26 ENCOUNTER — Emergency Department: Payer: Worker's Compensation

## 2014-09-26 ENCOUNTER — Encounter: Payer: Self-pay | Admitting: Emergency Medicine

## 2014-09-26 ENCOUNTER — Emergency Department
Admission: EM | Admit: 2014-09-26 | Discharge: 2014-09-26 | Disposition: A | Discharge status: Home / Self Care | Payer: Worker's Compensation | Attending: Emergency Medicine | Admitting: Emergency Medicine

## 2014-09-26 DIAGNOSIS — Z7982 Long term (current) use of aspirin: Secondary | ICD-10-CM | POA: Insufficient documentation

## 2014-09-26 DIAGNOSIS — W01198A Fall on same level from slipping, tripping and stumbling with subsequent striking against other object, initial encounter: Secondary | ICD-10-CM | POA: Diagnosis not present

## 2014-09-26 DIAGNOSIS — I129 Hypertensive chronic kidney disease with stage 1 through stage 4 chronic kidney disease, or unspecified chronic kidney disease: Secondary | ICD-10-CM | POA: Insufficient documentation

## 2014-09-26 DIAGNOSIS — Y9289 Other specified places as the place of occurrence of the external cause: Secondary | ICD-10-CM | POA: Diagnosis not present

## 2014-09-26 DIAGNOSIS — Z87891 Personal history of nicotine dependence: Secondary | ICD-10-CM | POA: Insufficient documentation

## 2014-09-26 DIAGNOSIS — S0003XA Contusion of scalp, initial encounter: Secondary | ICD-10-CM | POA: Diagnosis not present

## 2014-09-26 DIAGNOSIS — Y990 Civilian activity done for income or pay: Secondary | ICD-10-CM | POA: Diagnosis not present

## 2014-09-26 DIAGNOSIS — Y9389 Activity, other specified: Secondary | ICD-10-CM | POA: Diagnosis not present

## 2014-09-26 DIAGNOSIS — N183 Chronic kidney disease, stage 3 (moderate): Secondary | ICD-10-CM | POA: Insufficient documentation

## 2014-09-26 DIAGNOSIS — S50812A Abrasion of left forearm, initial encounter: Secondary | ICD-10-CM | POA: Diagnosis not present

## 2014-09-26 DIAGNOSIS — Z79899 Other long term (current) drug therapy: Secondary | ICD-10-CM | POA: Diagnosis not present

## 2014-09-26 DIAGNOSIS — S0990XA Unspecified injury of head, initial encounter: Secondary | ICD-10-CM | POA: Diagnosis present

## 2014-09-26 MED ORDER — BACITRACIN-NEOMYCIN-POLYMYXIN 400-5-5000 EX OINT
TOPICAL_OINTMENT | Freq: Once | CUTANEOUS | Status: AC
  Administered 2014-09-26: 1 via TOPICAL
  Filled 2014-09-26: qty 1

## 2014-09-26 NOTE — ED Notes (Signed)
Brought over from [facility]  S/p fall at work with possible LOC. Deaf interpreter called

## 2014-09-26 NOTE — ED Provider Notes (Signed)
[facility] Emergency Department Provider Note  ____________________________________________  Time seen: 2:49 PM  I have reviewed the triage vital signs and the nursing notes.   HISTORY  Chief Complaint Fall  History is obtained through a sign language interpreter  HPI Thomas [name] is a 80 y.o. male had a mechanical fall today at work. He states he fell backwards tripping over a box that was behind him and landed on concrete floor. He did hit his head and has abrasions to the right posterior aspect. There is a history of some dizziness which is now cleared. There is questionable loss of consciousness although he denies any LOC. He denies any vision problems, nausea or vomiting. He does have some abrasions to his left forearm. Tetanus is been in the last 3 years.He describes his pain as a 6 out of 10.   Past Medical History  Diagnosis Date  . Coronary artery disease     a. s/p CABG 1986. b. Multiple PCIs/caths. c. 09/2013: s/p PTCA and BMS to SVG-OM.  [name] PAF (paroxysmal atrial fibrillation)   . History of bleeding peptic ulcer 1980  . History of abdominal aortic aneurysm   . History of epididymitis 2013  . Testicular swelling   . Hydronephrosis with ureteropelvic junction obstruction   . Hydroureter on left 2009  . Status post coronary artery bypass grafting 1986    LIMA to the LAD, SVG to OM, SVG to RCA  . Biventricular ICD (implantable cardioverter-defibrillator) in place 03/24/2005    Implantation of a [place], serial number [id number]  . Ischemic cardiomyopathy     a. Prior EF 30-35%, s/p BIV-ICD. b. 09/2013: EF 45-50%.  . Deaf   . CKD (chronic kidney disease), stage III   . Moderate tricuspid regurgitation   . HTN (hypertension)     Patient Active Problem List   Diagnosis Date Noted  . Benign essential HTN 04/02/2014  . Hematochezia 02/20/2014  . Moderate tricuspid regurgitation 02/20/2014  . Mobitz type II atrioventricular  block 12/12/2013  . Long term current use of anticoagulant therapy 10/18/2013  . Acute myocardial infarction, subendocardial infarction, initial episode of care 09/21/2013  . Cardiomyopathy, ischemic: Ejection fraction 45% 11/11/2012  . CAD Status post coronary artery bypass grafting: 1995 11/11/2012  . Permanent atrial fibrillation 11/11/2012  . Biventricular ICD Medtronic Viva single chamber October 2014 11/11/2012  . Chronic systolic heart failure 78/93/8101    Past Surgical History  Procedure Laterality Date  . Coronary artery bypass graft  1986  . Insert / replace / remove pacemaker    . Transurethral resection of prostate      s/p  . Cardiac catheterization  12/10/2011    SVG to OM widely patent.  LIMA to LAD patent  . 2-d echocardiogram  11/20/2011    Ejection fraction 30-35% moderate concentric left ventricular hypertrophy. Left atrium is moderately dilated. Mild MR. Mild or  . Persantine myoview  05/06/2010    Post-rest ejection fraction 30%. No significant ischemia demonstrated. Compared to previous study there is no significant change.  . Left heart catheterization with coronary/graft angiogram N/A 12/10/2011    Procedure: LEFT HEART CATHETERIZATION WITH Beatrix Fetters;  Surgeon: Sanda Klein, MD;  Location: [place] CATH LAB;  Service: Cardiovascular;  Laterality: N/A;  . Bi-ventricular implantable cardioverter defibrillator N/A 12/16/2012    Procedure: BI-VENTRICULAR IMPLANTABLE CARDIOVERTER DEFIBRILLATOR  (CRT-D);  Surgeon: [name], MD;  Location: [facility] CATH LAB;  Service: Cardiovascular;  Laterality: N/A;  . Left heart  catheterization with coronary/graft angiogram N/A 09/25/2013    Procedure: LEFT HEART CATHETERIZATION WITH Beatrix Fetters;  Surgeon: Blane Ohara, MD;  Location: [facility] CATH LAB;  Service: Cardiovascular;  Laterality: N/A;    Current Outpatient Rx  Name  Route  Sig  Dispense  Refill  . apixaban (ELIQUIS) 5 MG TABS tablet   Oral   Take 1  tablet (5 mg total) by mouth 2 (two) times daily.   60 tablet   1   . aspirin 81 MG EC tablet   Oral   Take 1 tablet (81 mg total) by mouth daily.   30 tablet   7   . carvedilol (COREG) 12.5 MG tablet   Oral   Take 1 tablet (12.5 mg total) by mouth 2 (two) times daily.   180 tablet   3   . furosemide (LASIX) 40 MG tablet   Oral   Take 1 tablet (40 mg total) by mouth daily.         . isosorbide mononitrate (IMDUR) 60 MG 24 hr tablet   Oral   Take 1.5 tablets (90 mg total) by mouth daily.   135 tablet   3   . losartan-hydrochlorothiazide (HYZAAR) 50-12.5 MG per tablet      TAKE 1 TABLET BY MOUTH DAILY.   30 tablet   6   . nitroGLYCERIN (NITROSTAT) 0.4 MG SL tablet   Sublingual   Place 0.4 mg under the tongue every 5 (five) minutes as needed (MAX 3 TABLETS). For chest pain.         [name] oxymetazoline (AFRIN) 0.05 % nasal spray   Each Nare   Place 2 sprays into both nostrils 2 (two) times daily as needed for congestion. Use as needed for nose bleeds   15 mL   2   . simvastatin (ZOCOR) 20 MG tablet      TAKE 1 TABLET BY MOUTH DAILY   30 tablet   6   . simvastatin (ZOCOR) 20 MG tablet      TAKE 1 TABLET BY MOUTH DAILY   30 tablet   5   . vitamin B-12 (CYANOCOBALAMIN) 500 MCG tablet   Oral   Take 500 mcg by mouth daily.           Allergies Phenazopyridine  No family history on file.  Social History History  Substance Use Topics  . Smoking status: Former Smoker    Quit date: 03/15/1985  . Smokeless tobacco: Not on file  . Alcohol Use: No     Comment: occas.    Review of Systems Constitutional: No fever/chills Eyes: No visual changes. ENT: No sore throat. Cardiovascular: Denies chest pain. Respiratory: Denies shortness of breath. Gastrointestinal: No abdominal pain.  No nausea, no vomiting.  Genitourinary: Negative for dysuria. Musculoskeletal: Negative for back pain. Skin: Negative for rash. Neurological: Negative for headaches, focal  weakness or numbness.  10-point ROS otherwise negative.  ____________________________________________   PHYSICAL EXAM:  VITAL SIGNS: ED Triage Vitals  Enc Vitals Group     BP 09/26/14 1154 116/59 mmHg     Pulse Rate 09/26/14 1154 67     Resp 09/26/14 1154 20     Temp --      Temp src --      SpO2 09/26/14 1154 96 %     Weight 09/26/14 1151 235 lb (106.595 kg)     Height 09/26/14 1151 6\' 2"  (1.88 m)     Head Cir --  Peak Flow --      Pain Score 09/26/14 1151 6     Pain Loc --      Pain Edu? --      Excl. in [place]? --     Constitutional: Alert and oriented. Well appearing and in no acute distress. Eyes: Conjunctivae are normal. PERRL. EOMI. Head: Atraumatic. Nose: No congestion/rhinnorhea. Neck: No stridor. No cervical tenderness on palpation.  Cardiovascular: Normal rate, regular rhythm. Grossly normal heart sounds.  Good peripheral circulation. Respiratory: Normal respiratory effort.  No retractions. Lungs CTAB. Gastrointestinal: Soft and nontender. No distention. Musculoskeletal: No lower extremity tenderness nor edema.  No joint effusions. Neurologic:  Normal speech and language. No gross focal neurologic deficits are appreciated. No gait instability. Skin:  Skin is warm, dry. No rash noted. There is superficial abrasions to his left forearm 2 bleeding is controlled. There is some soft tissue edema localized right posterior scalp approximately 2.5 cm  in diameter. This is very tender to touch. There is also a superficial abrasion but no active bleeding at present. Psychiatric: Mood and affect are normal. Speech and behavior are normal.  ____________________________________________   LABS (all labs ordered are listed, but only abnormal results are displayed)  Labs Reviewed - No data to display   RADIOLOGY  CT of the head failed to show any intracranial findings acutely per radiologist ____________________________________________   PROCEDURES  Procedure(s)  performed: None  Critical Care performed: No  ____________________________________________   INITIAL IMPRESSION / ASSESSMENT AND PLAN / ED COURSE  Pertinent labs & imaging results that were available during my care of the patient were reviewed by me and considered in my medical decision making (see chart for details).  Discharge instructions were discussed with the patient and wife present using the sign language interpreter. Patient and wife are aware that should he have any symptoms listed on the head injury sheet they're to return to the emergency room immediately. He is follow-up with his doctor this week. He is also to only take Tylenol if needed for headache or pain this evening. He was given an ice bag while in the emergency room and also Neosporin Band-Aid to his abrasions on his left forearm. ____________________________________________   FINAL CLINICAL IMPRESSION(S) / ED DIAGNOSES  Final diagnoses:  Contusion of scalp, initial encounter  Abrasion of forearm, left, initial encounter      Johnn Hai, PA-C 09/26/14 [place], MD 10/01/14 [phone]

## 2014-09-26 NOTE — ED Notes (Signed)
Per interpreter pt fell backward over a box at work and hit his head on the cement floor. Pt denies any loc at time of fall but c/o dizziness.

## 2014-09-26 NOTE — ED Notes (Signed)
Tripped over box and fell backwards and hit head on concrete, has abrasion to back of head, and to left forearm, momently loc

## 2014-09-26 NOTE — Discharge Instructions (Signed)

## 2014-10-08 ENCOUNTER — Ambulatory Visit: Payer: No Typology Code available for payment source | Admitting: Gastroenterology

## 2014-10-24 ENCOUNTER — Ambulatory Visit (INDEPENDENT_AMBULATORY_CARE_PROVIDER_SITE_OTHER): Payer: PPO | Admitting: *Deleted

## 2014-10-24 ENCOUNTER — Encounter: Payer: Self-pay | Admitting: Internal Medicine

## 2014-10-24 DIAGNOSIS — I5022 Chronic systolic (congestive) heart failure: Secondary | ICD-10-CM | POA: Diagnosis not present

## 2014-10-24 DIAGNOSIS — I255 Ischemic cardiomyopathy: Secondary | ICD-10-CM | POA: Diagnosis not present

## 2014-10-24 NOTE — Progress Notes (Signed)
Remote ICD transmission.   

## 2014-10-29 ENCOUNTER — Telehealth: Payer: Self-pay

## 2014-10-29 NOTE — Telephone Encounter (Signed)
Patient left form to be completed by Dr. Sallyanne Kuster for Geoffery [name] to avoid service disruption from November 2016 through March 2017

## 2014-10-30 LAB — CUP PACEART REMOTE DEVICE CHECK
Battery Voltage: 2.97 V
Brady Statistic AP VP Percent: 0 %
Brady Statistic AS VP Percent: 99.14 %
Brady Statistic AS VS Percent: 0.86 %
Brady Statistic RA Percent Paced: 0 %
Brady Statistic RV Percent Paced: 99.33 %
HIGH POWER IMPEDANCE MEASURED VALUE: 70 Ohm
Lead Channel Impedance Value: 323 Ohm
Lead Channel Impedance Value: 380 Ohm
Lead Channel Impedance Value: 380 Ohm
Lead Channel Impedance Value: 437 Ohm
Lead Channel Impedance Value: 646 Ohm
Lead Channel Impedance Value: 646 Ohm
Lead Channel Impedance Value: 665 Ohm
Lead Channel Pacing Threshold Amplitude: 0.625 V
Lead Channel Pacing Threshold Pulse Width: 0.4 ms
Lead Channel Pacing Threshold Pulse Width: 0.6 ms
Lead Channel Sensing Intrinsic Amplitude: 0.75 mV
Lead Channel Sensing Intrinsic Amplitude: 0.75 mV
Lead Channel Sensing Intrinsic Amplitude: 17.375 mV
Lead Channel Sensing Intrinsic Amplitude: 17.375 mV
Lead Channel Setting Pacing Amplitude: 2 V
Lead Channel Setting Pacing Amplitude: 2.25 V
Lead Channel Setting Pacing Pulse Width: 0.4 ms
Lead Channel Setting Pacing Pulse Width: 0.6 ms
MDC IDC MSMT BATTERY REMAINING LONGEVITY: 63 mo
MDC IDC MSMT LEADCHNL LV IMPEDANCE VALUE: 380 Ohm
MDC IDC MSMT LEADCHNL LV IMPEDANCE VALUE: 494 Ohm
MDC IDC MSMT LEADCHNL LV IMPEDANCE VALUE: 703 Ohm
MDC IDC MSMT LEADCHNL LV IMPEDANCE VALUE: 703 Ohm
MDC IDC MSMT LEADCHNL LV PACING THRESHOLD AMPLITUDE: 1.25 V
MDC IDC MSMT LEADCHNL RA IMPEDANCE VALUE: 380 Ohm
MDC IDC MSMT LEADCHNL RV IMPEDANCE VALUE: 380 Ohm
MDC IDC SESS DTM: 20160810062705
MDC IDC SET LEADCHNL RV SENSING SENSITIVITY: 0.3 mV
MDC IDC SET ZONE DETECTION INTERVAL: 350 ms
MDC IDC STAT BRADY AP VS PERCENT: 0 %
Zone Setting Detection Interval: 300 ms
Zone Setting Detection Interval: 360 ms
Zone Setting Detection Interval: 400 ms

## 2014-10-30 NOTE — Telephone Encounter (Signed)
Form completed and signed to prevent gas from being turned off between November through March for [employer]  Mailed to patient.

## 2014-11-09 ENCOUNTER — Other Ambulatory Visit: Payer: Self-pay | Admitting: Cardiovascular Disease

## 2014-11-14 ENCOUNTER — Encounter: Payer: Self-pay | Admitting: Cardiology

## 2014-11-25 ENCOUNTER — Other Ambulatory Visit: Payer: Self-pay | Admitting: Cardiovascular Disease

## 2014-12-03 ENCOUNTER — Other Ambulatory Visit: Payer: Self-pay | Admitting: Cardiovascular Disease

## 2014-12-03 NOTE — Telephone Encounter (Signed)
REFILL 

## 2014-12-05 ENCOUNTER — Other Ambulatory Visit: Payer: Self-pay | Admitting: Cardiovascular Disease

## 2014-12-06 NOTE — Telephone Encounter (Signed)
REFILL 

## 2014-12-25 ENCOUNTER — Telehealth: Payer: Self-pay | Admitting: Cardiovascular Disease

## 2014-12-25 MED ORDER — APIXABAN 5 MG PO TABS: 5.0000 mg | ORAL_TABLET | Freq: Two times a day (BID) | ORAL | Status: DC

## 2014-12-25 NOTE — Telephone Encounter (Signed)
Pt called back. Voiced understanding that Eliquis samples were provided at our front desk, will wait for further recommendations from physician.

## 2014-12-25 NOTE — Telephone Encounter (Signed)
Left message through interpreter services that I would leave Eliquis samples at front desk for patient.  Per last encounter, Dr. Loletha Grayer noted did consider coumadin but concern would be for compliance on that medication. Will route for recommendations.

## 2014-12-25 NOTE — Telephone Encounter (Signed)
Thomas [name] is calling about a medication (Eliquis) stating that he is unable to afford this and is wanting something that is more affordable for him . Please call  Thanks

## 2014-12-28 ENCOUNTER — Encounter: Payer: Self-pay | Admitting: *Deleted

## 2014-12-28 ENCOUNTER — Emergency Department: Payer: PPO

## 2014-12-28 ENCOUNTER — Emergency Department
Admission: EM | Admit: 2014-12-28 | Discharge: 2014-12-28 | Disposition: A | Discharge status: Home / Self Care | Payer: PPO | Attending: Emergency Medicine | Admitting: Emergency Medicine

## 2014-12-28 DIAGNOSIS — H913 Deaf nonspeaking, not elsewhere classified: Secondary | ICD-10-CM | POA: Insufficient documentation

## 2014-12-28 DIAGNOSIS — N183 Chronic kidney disease, stage 3 (moderate): Secondary | ICD-10-CM | POA: Diagnosis not present

## 2014-12-28 DIAGNOSIS — J209 Acute bronchitis, unspecified: Secondary | ICD-10-CM | POA: Diagnosis not present

## 2014-12-28 DIAGNOSIS — Z79899 Other long term (current) drug therapy: Secondary | ICD-10-CM | POA: Insufficient documentation

## 2014-12-28 DIAGNOSIS — I129 Hypertensive chronic kidney disease with stage 1 through stage 4 chronic kidney disease, or unspecified chronic kidney disease: Secondary | ICD-10-CM | POA: Diagnosis not present

## 2014-12-28 DIAGNOSIS — J069 Acute upper respiratory infection, unspecified: Secondary | ICD-10-CM | POA: Diagnosis not present

## 2014-12-28 DIAGNOSIS — Z7952 Long term (current) use of systemic steroids: Secondary | ICD-10-CM | POA: Insufficient documentation

## 2014-12-28 DIAGNOSIS — J40 Bronchitis, not specified as acute or chronic: Secondary | ICD-10-CM

## 2014-12-28 DIAGNOSIS — Z87891 Personal history of nicotine dependence: Secondary | ICD-10-CM | POA: Diagnosis not present

## 2014-12-28 DIAGNOSIS — Z7982 Long term (current) use of aspirin: Secondary | ICD-10-CM | POA: Diagnosis not present

## 2014-12-28 DIAGNOSIS — R05 Cough: Secondary | ICD-10-CM | POA: Diagnosis present

## 2014-12-28 MED ORDER — PREDNISONE 20 MG PO TABS
40.0000 mg | ORAL_TABLET | Freq: Once | ORAL | Status: AC
  Administered 2014-12-28: 40 mg via ORAL
  Filled 2014-12-28: qty 2

## 2014-12-28 MED ORDER — IPRATROPIUM-ALBUTEROL 0.5-2.5 (3) MG/3ML IN SOLN
1.5000 mL | Freq: Once | RESPIRATORY_TRACT | Status: AC
  Administered 2014-12-28: 1.5 mL via RESPIRATORY_TRACT
  Filled 2014-12-28: qty 3

## 2014-12-28 MED ORDER — PREDNISONE 20 MG PO TABS: 20.0000 mg | ORAL_TABLET | Freq: Once | ORAL | Status: DC

## 2014-12-28 MED ORDER — AZITHROMYCIN 250 MG PO TABS
500.0000 mg | ORAL_TABLET | Freq: Once | ORAL | Status: AC
  Administered 2014-12-28: 500 mg via ORAL
  Filled 2014-12-28: qty 2

## 2014-12-28 MED ORDER — ALBUTEROL SULFATE HFA 108 (90 BASE) MCG/ACT IN AERS: 2.0000 | INHALATION_SPRAY | Freq: Four times a day (QID) | RESPIRATORY_TRACT | Status: DC | PRN

## 2014-12-28 MED ORDER — IPRATROPIUM-ALBUTEROL 0.5-2.5 (3) MG/3ML IN SOLN: 3.0000 mL | Freq: Once | RESPIRATORY_TRACT | Status: DC

## 2014-12-28 MED ORDER — AZITHROMYCIN 500 MG PO TABS: 500.0000 mg | ORAL_TABLET | Freq: Every day | ORAL | Status: AC

## 2014-12-28 NOTE — ED Notes (Signed)
Pt reports cough and congestion since last night. Unable to sleep well. Pt deaf, writing on papers in triage.

## 2014-12-28 NOTE — ED Provider Notes (Signed)
[facility] Emergency Department Provider Note  ____________________________________________  Time seen: 1300  I have reviewed the triage vital signs and the nursing notes.   HISTORY  Chief Complaint Cough and Nasal Congestion     HPI Thomas [name] is a pleasant 80 y.o. male who is deaf and communicates with written and language. He is alert and in no acute distress. He reports that he has had a cough for one week. He also reports he has had nasal congestion and a bit of a sore throat. He feels the sore throat is because he has been coughing so much. He denies any fever. He denies any chest pain. He reports that the cough is keeping him awake at night.  Thomas [name] does have a cardiac history. He has an implanted pacemaker.   Past Medical History  Diagnosis Date  . Coronary artery disease     a. s/p CABG 1986. b. Multiple PCIs/caths. c. 09/2013: s/p PTCA and BMS to SVG-OM.  [name] PAF (paroxysmal atrial fibrillation) ([place])   . History of bleeding peptic ulcer 1980  . History of abdominal aortic aneurysm   . History of epididymitis 2013  . Testicular swelling   . Hydronephrosis with ureteropelvic junction obstruction   . Hydroureter on left 2009  . Status post coronary artery bypass grafting 1986    LIMA to the LAD, SVG to OM, SVG to RCA  . Biventricular ICD (implantable cardioverter-defibrillator) in place 03/24/2005    Implantation of a [place], serial number [id number]  . Ischemic cardiomyopathy     a. Prior EF 30-35%, s/p BIV-ICD. b. 09/2013: EF 45-50%.  . Deaf   . CKD (chronic kidney disease), stage III   . Moderate tricuspid regurgitation   . HTN (hypertension)     Patient Active Problem List   Diagnosis Date Noted  . Benign essential HTN 04/02/2014  . Hematochezia 02/20/2014  . Moderate tricuspid regurgitation 02/20/2014  . Mobitz type II atrioventricular block 12/12/2013  . Long term current use of anticoagulant therapy  10/18/2013  . Acute myocardial infarction, subendocardial infarction, initial episode of care ([place]) 09/21/2013  . Cardiomyopathy, ischemic: Ejection fraction 45% 11/11/2012  . CAD Status post coronary artery bypass grafting: 1995 11/11/2012  . Permanent atrial fibrillation ([place]) 11/11/2012  . Biventricular ICD Medtronic Viva single chamber October 2014 11/11/2012  . Chronic systolic heart failure ([place]) 11/11/2012    Past Surgical History  Procedure Laterality Date  . Coronary artery bypass graft  1986  . Insert / replace / remove pacemaker    . Transurethral resection of prostate      s/p  . Cardiac catheterization  12/10/2011    SVG to OM widely patent.  LIMA to LAD patent  . 2-d echocardiogram  11/20/2011    Ejection fraction 30-35% moderate concentric left ventricular hypertrophy. Left atrium is moderately dilated. Mild MR. Mild or  . Persantine myoview  05/06/2010    Post-rest ejection fraction 30%. No significant ischemia demonstrated. Compared to previous study there is no significant change.  . Left heart catheterization with coronary/graft angiogram N/A 12/10/2011    Procedure: LEFT HEART CATHETERIZATION WITH Beatrix Fetters;  Surgeon: Sanda Klein, MD;  Location: [place] CATH LAB;  Service: Cardiovascular;  Laterality: N/A;  . Bi-ventricular implantable cardioverter defibrillator N/A 12/16/2012    Procedure: BI-VENTRICULAR IMPLANTABLE CARDIOVERTER DEFIBRILLATOR  (CRT-D);  Surgeon: [name], MD;  Location: [facility] CATH LAB;  Service: Cardiovascular;  Laterality: N/A;  . Left heart catheterization with coronary/graft angiogram  N/A 09/25/2013    Procedure: LEFT HEART CATHETERIZATION WITH Beatrix Fetters;  Surgeon: Blane Ohara, MD;  Location: [facility] CATH LAB;  Service: Cardiovascular;  Laterality: N/A;    Current Outpatient Rx  Name  Route  Sig  Dispense  Refill  . albuterol (PROVENTIL HFA;VENTOLIN HFA) 108 (90 BASE) MCG/ACT inhaler   Inhalation   Inhale 2 puffs  into the lungs every 6 (six) hours as needed for wheezing or shortness of breath.   1 Inhaler   0   . apixaban (ELIQUIS) 5 MG TABS tablet   Oral   Take 1 tablet (5 mg total) by mouth 2 (two) times daily.   56 tablet   0     [id number]  Exp 03/2017   . aspirin 81 MG EC tablet   Oral   Take 1 tablet (81 mg total) by mouth daily.   30 tablet   7   . azithromycin (ZITHROMAX) 500 MG tablet   Oral   Take 1 tablet (500 mg total) by mouth daily. Take 1 tablet daily for 3 days.   2 tablet   0   . carvedilol (COREG) 12.5 MG tablet      TAKE 1 TABLET (12.5 MG TOTAL) BY MOUTH 2 (TWO) TIMES DAILY.   180 tablet   0   . ELIQUIS 5 MG TABS tablet      TAKE 1 TABLET (5 MG TOTAL) BY MOUTH 2 (TWO) TIMES DAILY.   60 tablet   4   . furosemide (LASIX) 40 MG tablet   Oral   Take 1 tablet (40 mg total) by mouth daily.         . isosorbide mononitrate (IMDUR) 60 MG 24 hr tablet   Oral   Take 1.5 tablets (90 mg total) by mouth daily.   135 tablet   3   . losartan-hydrochlorothiazide (HYZAAR) 50-12.5 MG per tablet      TAKE 1 TABLET BY MOUTH EVERY DAY   30 tablet   2   . nitroGLYCERIN (NITROSTAT) 0.4 MG SL tablet   Sublingual   Place 0.4 mg under the tongue every 5 (five) minutes as needed (MAX 3 TABLETS). For chest pain.         [name] oxymetazoline (AFRIN) 0.05 % nasal spray   Each Nare   Place 2 sprays into both nostrils 2 (two) times daily as needed for congestion. Use as needed for nose bleeds   15 mL   2   . predniSONE (DELTASONE) 20 MG tablet   Oral   Take 1 tablet (20 mg total) by mouth once.   4 tablet   0   . simvastatin (ZOCOR) 20 MG tablet      TAKE 1 TABLET BY MOUTH DAILY   30 tablet   6   . simvastatin (ZOCOR) 20 MG tablet      TAKE 1 TABLET BY MOUTH DAILY   30 tablet   5   . vitamin B-12 (CYANOCOBALAMIN) 500 MCG tablet   Oral   Take 500 mcg by mouth daily.           Allergies Phenazopyridine  No family history on file.  Social  History Social History  Substance Use Topics  . Smoking status: Former Smoker    Quit date: 03/15/1985  . Smokeless tobacco: None  . Alcohol Use: No     Comment: occas.    Review of Systems  Constitutional: Negative for fever. ENT: Positive for sore throat. Cardiovascular: Negative  for chest pain. Respiratory: Positive for cough. See history of present illness Gastrointestinal: Negative for abdominal pain, vomiting and diarrhea. Genitourinary: Negative for dysuria. Musculoskeletal: No myalgias or injuries. Skin: Negative for rash. Neurological: Negative for paresthesia or weakness   10-point ROS otherwise negative.  ____________________________________________   PHYSICAL EXAM:  VITAL SIGNS: ED Triage Vitals  Enc Vitals Group     BP 12/28/14 1124 130/79 mmHg     Pulse Rate 12/28/14 1124 80     Resp 12/28/14 1124 16     Temp 12/28/14 1124 97.6 F (36.4 C)     Temp Source 12/28/14 1124 Oral     SpO2 12/28/14 1124 95 %     Weight 12/28/14 1124 240 lb (108.863 kg)     Height 12/28/14 1124 6\' 2"  (1.88 m)     Head Cir --      Peak Flow --      Pain Score --      Pain Loc --      Pain Edu? --      Excl. in [place]? --     Constitutional: Alert. Communicative through writing. Cognition appears intact. No acute distress. Intermittent cough noted. ENT   Head: Normocephalic and atraumatic.   Nose: No congestion/rhinnorhea.         Oropharynx: No swelling or erythema. Normal-appearing posterior oropharynx. Cardiovascular: Normal rate, regular rhythm, no murmur noted Respiratory:  Normal respiratory effort, no tachypnea.    Breath sounds are clear without a noted wheeze on auscultation with stethoscope. Following this exam, it did sound as though the patient had a mild gross wheeze to the naked ear.  Gastrointestinal: Soft and nontender. No distention.  Back: No muscle spasm, no tenderness, no CVA tenderness. Musculoskeletal: No deformity noted. Nontender with normal  range of motion in all extremities.  No noted edema. Neurologic:  Patient is deaf. Communicating well with written word. No acute gross focal neurologic deficits are appreciated.  Skin:  Skin is warm, dry. No rash noted. Psychiatric: Mood and affect are normal. Behavior are normal.  ____________________________________________    LABS (pertinent positives/negatives)    ____________________________________________   EKG    ____________________________________________    RADIOLOGY  Chest x-ray: IMPRESSION: 1. No radiographic evidence of acute cardiopulmonary disease. 2. Moderate cardiomegaly, unchanged. 3. Extensive atherosclerosis. 4. Postoperative changes and support apparatus, as above.  ____________________________________________ ____________________________________________   INITIAL IMPRESSION / ASSESSMENT AND PLAN / ED COURSE  Pertinent labs & imaging results that were available during my care of the patient were reviewed by me and considered in my medical decision making (see chart for details).  The patient's chest x-ray is negative for acute change. There is no infiltrate. No sign of pneumonia. He likely has a viral bronchitis. Given his notable comorbidities due to his heart disease, we will treat him with antibiotics for just respiratory infection that has been present for one week. I will also treat him with a half dose breathing treatment here in the emergency department due to the mild wheeze that was noted with the naked ear. We will also place the patient on low-dose steroids to attempt to reduce his cough and allow him to sleep better tonight.  I've asked patient follow with his primary physician and I counseled him to return to the emergency department if he has increasing shortness of breath, if he has any chest pain, or if he has any other concerns.  ____________________________________________   FINAL CLINICAL IMPRESSION(S) / ED DIAGNOSES  Final  diagnoses:  URI (upper respiratory infection)  Bronchitis      Ahmed Prima, MD 12/28/14 2212

## 2014-12-28 NOTE — Discharge Instructions (Signed)
You appeared to have a cold and minor bronchitis. We discussed using antibiotics, even though this is more likely to be viral, because of your other health problems and the length of time that you've had this cough. Take azithromycin once a day for the next 2 days. This total of 3 days of azithromycin will give you antibiotic coverage for 10 days. Take prednisone as prescribed. This is a low dose, but it may help reduce the cough. Use the albuterol inhaler as needed. It may also help clear lungs and reduce the cough. Return to the emergency department if you have more shortness of breath, if you have chest pain, or if you have any other urgent concerns. Follow-up with her regular doctor this coming week for ongoing care.  Upper Respiratory Infection, Adult Most upper respiratory infections (URIs) are caused by a virus. A URI affects the nose, throat, and upper air passages. The most common type of URI is often called "the common cold." HOME CARE   Take medicines only as told by your doctor.  Gargle warm saltwater or take cough drops to comfort your throat as told by your doctor.  Use a warm mist humidifier or inhale steam from a shower to increase air moisture. This may make it easier to breathe.  Drink enough fluid to keep your pee (urine) clear or pale yellow.  Eat soups and other clear broths.  Have a healthy diet.  Rest as needed.  Go back to work when your fever is gone or your doctor says it is okay.  You may need to stay home longer to avoid giving your URI to others.  You can also wear a face mask and wash your hands often to prevent spread of the virus.  Use your inhaler more if you have asthma.  Do not use any tobacco products, including cigarettes, chewing tobacco, or electronic cigarettes. If you need help quitting, ask your doctor. GET HELP IF:  You are getting worse, not better.  Your symptoms are not helped by medicine.  You have chills.  You are getting more  short of breath.  You have brown or red mucus.  You have yellow or brown discharge from your nose.  You have pain in your face, especially when you bend forward.  You have a fever.  You have puffy (swollen) neck glands.  You have pain while swallowing.  You have white areas in the back of your throat. GET HELP RIGHT AWAY IF:   You have very bad or constant:  Headache.  Ear pain.  Pain in your forehead, behind your eyes, and over your cheekbones (sinus pain).  Chest pain.  You have long-lasting (chronic) lung disease and any of the following:  Wheezing.  Long-lasting cough.  Coughing up blood.  A change in your usual mucus.  You have a stiff neck.  You have changes in your:  Vision.  Hearing.  Thinking.  Mood. MAKE SURE YOU:   Understand these instructions.  Will watch your condition.  Will get help right away if you are not doing well or get worse.   This information is not intended to replace advice given to you by your health care provider. Make sure you discuss any questions you have with your health care provider.   Document Released: 08/19/2007 Document Revised: 07/17/2014 Document Reviewed: 06/07/2013 Elsevier Interactive Patient Education [employer].

## 2014-12-31 NOTE — Telephone Encounter (Signed)
Left message with phone interpreter: "This is Ovid Curd returning call from Dr. Lurline Del office, regarding the medication you called about last week. I have a suggestion to start the patient assistance program to get this medicine affordably. There is a form you must fill out - I have printed a copy and will leave it at the desk for you to pick up. You may also call back to ask questions and I can give information on how to get the form."

## 2014-12-31 NOTE — Telephone Encounter (Signed)
Would suggest he apply for patient assistance for Eliquis.  Has had compliance problems with warfarin in the past, not a good candidate to go back on.  He would need to show spending 3% of his income on medications in the calendar year to date in order to qualify.

## 2015-01-21 ENCOUNTER — Encounter: Payer: PPO | Admitting: Internal Medicine

## 2015-01-21 NOTE — Progress Notes (Signed)
HPI Thomas [name] returns today for followup. He is a pleasant 80 yo man with an ICM, chronic systolic CHF, LBBB, EF 54%. He underwent BiV ICD implant over a year ago. In the interim, the patient is much improved. His chronic systolic heart failure has improved from class IIIB to class IIA. He has had no syncope. He denies chest pain or peripheral edema. No ICD shock. He remains active. He admits to some dietary indiscretion. Allergies  Allergen Reactions  . Phenazopyridine Nausea Only and Other (See Comments)    Other Reaction: GI UPSET     Current Outpatient Prescriptions  Medication Sig Dispense Refill  . albuterol (PROVENTIL HFA;VENTOLIN HFA) 108 (90 BASE) MCG/ACT inhaler Inhale 2 puffs into the lungs every 6 (six) hours as needed for wheezing or shortness of breath. 1 Inhaler 0  . apixaban (ELIQUIS) 5 MG TABS tablet Take 1 tablet (5 mg total) by mouth 2 (two) times daily. 56 tablet 0  . aspirin 81 MG EC tablet Take 1 tablet (81 mg total) by mouth daily. 30 tablet 7  . carvedilol (COREG) 12.5 MG tablet TAKE 1 TABLET (12.5 MG TOTAL) BY MOUTH 2 (TWO) TIMES DAILY. 180 tablet 0  . ELIQUIS 5 MG TABS tablet TAKE 1 TABLET (5 MG TOTAL) BY MOUTH 2 (TWO) TIMES DAILY. 60 tablet 4  . furosemide (LASIX) 40 MG tablet Take 1 tablet (40 mg total) by mouth daily.    . isosorbide mononitrate (IMDUR) 60 MG 24 hr tablet Take 1.5 tablets (90 mg total) by mouth daily. 135 tablet 3  . losartan-hydrochlorothiazide (HYZAAR) 50-12.5 MG per tablet TAKE 1 TABLET BY MOUTH EVERY DAY 30 tablet 2  . nitroGLYCERIN (NITROSTAT) 0.4 MG SL tablet Place 0.4 mg under the tongue every 5 (five) minutes as needed (MAX 3 TABLETS). For chest pain.    [name] oxymetazoline (AFRIN) 0.05 % nasal spray Place 2 sprays into both nostrils 2 (two) times daily as needed for congestion. Use as needed for nose bleeds 15 mL 2  . predniSONE (DELTASONE) 20 MG tablet Take 1 tablet (20 mg total) by mouth once. 4 tablet 0  . simvastatin (ZOCOR) 20  MG tablet TAKE 1 TABLET BY MOUTH DAILY 30 tablet 6  . simvastatin (ZOCOR) 20 MG tablet TAKE 1 TABLET BY MOUTH DAILY 30 tablet 5  . vitamin B-12 (CYANOCOBALAMIN) 500 MCG tablet Take 500 mcg by mouth daily.     No current facility-administered medications for this visit.     Past Medical History  Diagnosis Date  . Coronary artery disease     a. s/p CABG 1986. b. Multiple PCIs/caths. c. 09/2013: s/p PTCA and BMS to SVG-OM.  [name] PAF (paroxysmal atrial fibrillation) ([place])   . History of bleeding peptic ulcer 1980  . History of abdominal aortic aneurysm   . History of epididymitis 2013  . Testicular swelling   . Hydronephrosis with ureteropelvic junction obstruction   . Hydroureter on left 2009  . Status post coronary artery bypass grafting 1986    LIMA to the LAD, SVG to OM, SVG to RCA  . Biventricular ICD (implantable cardioverter-defibrillator) in place 03/24/2005    Implantation of a [place], serial number [id number]  . Ischemic cardiomyopathy     a. Prior EF 30-35%, s/p BIV-ICD. b. 09/2013: EF 45-50%.  . Deaf   . CKD (chronic kidney disease), stage III   . Moderate tricuspid regurgitation   . HTN (hypertension)     ROS:   All  systems reviewed and negative except as noted in the HPI.   Past Surgical History  Procedure Laterality Date  . Coronary artery bypass graft  1986  . Insert / replace / remove pacemaker    . Transurethral resection of prostate      s/p  . Cardiac catheterization  12/10/2011    SVG to OM widely patent.  LIMA to LAD patent  . 2-d echocardiogram  11/20/2011    Ejection fraction 30-35% moderate concentric left ventricular hypertrophy. Left atrium is moderately dilated. Mild MR. Mild or  . Persantine myoview  05/06/2010    Post-rest ejection fraction 30%. No significant ischemia demonstrated. Compared to previous study there is no significant change.  . Left heart catheterization with coronary/graft angiogram N/A 12/10/2011    Procedure:  LEFT HEART CATHETERIZATION WITH Beatrix Fetters;  Surgeon: Sanda Klein, MD;  Location: [place] CATH LAB;  Service: Cardiovascular;  Laterality: N/A;  . Bi-ventricular implantable cardioverter defibrillator N/A 12/16/2012    Procedure: BI-VENTRICULAR IMPLANTABLE CARDIOVERTER DEFIBRILLATOR  (CRT-D);  Surgeon: [name], MD;  Location: [facility] CATH LAB;  Service: Cardiovascular;  Laterality: N/A;  . Left heart catheterization with coronary/graft angiogram N/A 09/25/2013    Procedure: LEFT HEART CATHETERIZATION WITH Beatrix Fetters;  Surgeon: Blane Ohara, MD;  Location: [facility] CATH LAB;  Service: Cardiovascular;  Laterality: N/A;     No family history on file.   Social History   Social History  . Marital Status: Married    Spouse Name: N/A  . Number of Children: N/A  . Years of Education: N/A   Occupational History  . Not on file.   Social History Main Topics  . Smoking status: Former Smoker    Quit date: 03/15/1985  . Smokeless tobacco: Not on file  . Alcohol Use: No     Comment: occas.  . Drug Use: No  . Sexual Activity: Not Currently   Other Topics Concern  . Not on file   Social History Narrative     There were no vitals taken for this visit.  Physical Exam:  Well appearing NAD HEENT: Unremarkable Neck:  No JVD, no thyromegally Lymphatics:  No adenopathy Back:  No CVA tenderness Lungs:  Clear HEART:  Regular rate rhythm, no murmurs, no rubs, no clicks Abd:  soft, positive bowel sounds, no organomegally, no rebound, no guarding Ext:  2 plus pulses, no edema, no cyanosis, no clubbing Skin:  No rashes no nodules Neuro:  CN II through XII intact, motor grossly intact  EKG  DEVICE  Normal device function.  See PaceArt for details.   Assess/Plan:

## 2015-01-28 ENCOUNTER — Telehealth: Payer: Self-pay | Admitting: Internal Medicine

## 2015-01-28 ENCOUNTER — Telehealth: Payer: Self-pay | Admitting: Cardiovascular Disease

## 2015-01-28 NOTE — Telephone Encounter (Signed)
Returned call to patient's wife office out of Eliquis samples.

## 2015-01-28 NOTE — Telephone Encounter (Signed)
Pt says since we do not have Eliquis,what can he do? The interpreter says you can reach her on her pager [phone]-(Text only)-if you can not get her on the phone.

## 2015-01-28 NOTE — Telephone Encounter (Signed)
Returned call to patient's wife.5 boxes of Eliquis 5 mg samples left at [employer] office front desk.Follow up appointment scheduled with Dr.Croitoru 02/20/15 at 2:00 pm.

## 2015-01-28 NOTE — Telephone Encounter (Signed)
New message: ° ° ° ° ° °Patient calling the office for samples of medication: ° ° °1.  What medication and dosage are you requesting samples for? eliquis 5 mg ° °2.  Are you currently out of this medication? yes ° ° °

## 2015-01-28 NOTE — Telephone Encounter (Signed)
Called pt and spoke with pt's spouse [name], explaining that we did not have any samples of Eliquis 5 mg and if they wanted to give our office a call back later in the week, to see if we have gotten any samples in. I also gave them the Medicare low-Income subsidy program information as well. I advised them that if they have any other problems, questions or concerns to call the office. Spouse verbalized understanding.

## 2015-01-28 NOTE — Telephone Encounter (Signed)
°  Patient calling the office for samples of medication: ° ° °1.  What medication and dosage are you requesting samples for? °Eliquis  °2.  Are you currently out of this medication?  °Yes  ° ° °

## 2015-02-20 ENCOUNTER — Encounter: Payer: Self-pay | Admitting: Cardiovascular Disease

## 2015-02-20 ENCOUNTER — Ambulatory Visit (INDEPENDENT_AMBULATORY_CARE_PROVIDER_SITE_OTHER): Payer: PPO | Admitting: Cardiovascular Disease

## 2015-02-20 VITALS — BP 110/68 | HR 78 | Ht 74.0 in | Wt 245.3 lb

## 2015-02-20 DIAGNOSIS — Z95810 Presence of automatic (implantable) cardiac defibrillator: Secondary | ICD-10-CM

## 2015-02-20 DIAGNOSIS — I25718 Atherosclerosis of autologous vein coronary artery bypass graft(s) with other forms of angina pectoris: Secondary | ICD-10-CM

## 2015-02-20 DIAGNOSIS — I4821 Permanent atrial fibrillation: Secondary | ICD-10-CM

## 2015-02-20 DIAGNOSIS — Z7901 Long term (current) use of anticoagulants: Secondary | ICD-10-CM

## 2015-02-20 DIAGNOSIS — I482 Chronic atrial fibrillation: Secondary | ICD-10-CM

## 2015-02-20 DIAGNOSIS — I441 Atrioventricular block, second degree: Secondary | ICD-10-CM

## 2015-02-20 DIAGNOSIS — I255 Ischemic cardiomyopathy: Secondary | ICD-10-CM

## 2015-02-20 DIAGNOSIS — I5022 Chronic systolic (congestive) heart failure: Secondary | ICD-10-CM

## 2015-02-20 DIAGNOSIS — Z951 Presence of aortocoronary bypass graft: Secondary | ICD-10-CM

## 2015-02-20 MED ORDER — APIXABAN 5 MG PO TABS: 5.0000 mg | ORAL_TABLET | Freq: Two times a day (BID) | ORAL | Status: DC

## 2015-02-20 NOTE — Patient Instructions (Signed)
Your physician wants you to follow-up in: 6 Months. You will receive a reminder letter in the mail two months in advance. If you don't receive a letter, please call our office to schedule the follow-up appointment.  Your physician has recommended you make the following change in your medication: STOP Aspirin  Merry Christmas and Happy New Year!!!

## 2015-02-20 NOTE — Progress Notes (Signed)
Patient ID: BILLY FORCE, male   DOB: 11-04-1934, 80 y.o.   MRN: [id number]     Cardiology Office Note   Date:  02/22/2015   ID:  NAZIRE CHISMAN, DOB May 23, 1934, MRN [id number]  PCP:  [name], MD  Cardiologist:   Sanda Klein, MD ; Cristopher Peru, M.D.  Chief Complaint  Patient presents with  . Follow-up    no chest pain, occassional shortness of breath, no edema, no pain or cramping in legs, no lightheaded or dizzness      History of Present Illness: EUFEMIO STFLEUR is a 80 y.o. male who presents for  Follow-up of chronic systolic heart failure due to ischemic cardiomyopathy with previous history of coronary artery bypass surgery and placement of a stent in the saphenous vein graft to the oblique marginal artery following an acute and STEMI in 2015. He has atrial fibrillation and a history of second degree Mobitz type 2 AV block for which he received a pacemaker that was upgraded to a CRT-D device in October 2014.  Alfonso has congenital deafness and is accompanied as always by sign language interpreter and by his wife.  Since his last appointment he has them quite well and denies problems with exertional angina, palpitations, syncope, focal neurological deficits, bleeding problems, edema, claudication or significant weight changes. 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  His defibrillator was interrogated during an office visit with Dr. [name] just 1 month ago.  He had bypass surgery in 1995. Left ventricle systolic function deteriorated in 2014 without options for revascularization by cardiac catheterization and he underwent implantation of his biventricular ICD in October 2014. He has substantial clinical improvement in left ventricular ejection fraction also improved to 45-50 percent. In July 2015 he had an acute non-STEMI with placement of a stent to the SVG to OM (bare-metal 3.512 mm MultiLink vision ). At that time  his native coronary arteries were found to be occluded the LIMA to LAD was patent with a 50-70 percent anastomosis and there is chronic total occlusion of the vein graft to the right coronary artery.  Past Medical History  Diagnosis Date  . Coronary artery disease     a. s/p CABG 1986. b. Multiple PCIs/caths. c. 09/2013: s/p PTCA and BMS to SVG-OM.  [name] PAF (paroxysmal atrial fibrillation) ([place])   . History of bleeding peptic ulcer 1980  . History of abdominal aortic aneurysm   . History of epididymitis 2013  . Testicular swelling   . Hydronephrosis with ureteropelvic junction obstruction   . Hydroureter on left 2009  . Status post coronary artery bypass grafting 1986    LIMA to the LAD, SVG to OM, SVG to RCA  . Biventricular ICD (implantable cardioverter-defibrillator) in place 03/24/2005    Implantation of a [place], serial number [id number]  . Ischemic cardiomyopathy     a. Prior EF 30-35%, s/p BIV-ICD. b. 09/2013: EF 45-50%.  . Deaf   . CKD (chronic kidney disease), stage III   . Moderate tricuspid regurgitation   . HTN (hypertension)     Past Surgical History  Procedure Laterality Date  . Coronary artery bypass graft  1986  . Insert / replace / remove pacemaker    . Transurethral resection of prostate      s/p  . Cardiac catheterization  12/10/2011    SVG to OM widely patent.  LIMA to LAD patent  . 2-d echocardiogram  11/20/2011  Ejection fraction 30-35% moderate concentric left ventricular hypertrophy. Left atrium is moderately dilated. Mild MR. Mild or  . Persantine myoview  05/06/2010    Post-rest ejection fraction 30%. No significant ischemia demonstrated. Compared to previous study there is no significant change.  . Left heart catheterization with coronary/graft angiogram N/A 12/10/2011    Procedure: LEFT HEART CATHETERIZATION WITH Beatrix Fetters;  Surgeon: Sanda Klein, MD;  Location: [place] CATH LAB;  Service: Cardiovascular;  Laterality: N/A;    . Bi-ventricular implantable cardioverter defibrillator N/A 12/16/2012    Procedure: BI-VENTRICULAR IMPLANTABLE CARDIOVERTER DEFIBRILLATOR  (CRT-D);  Surgeon: [name], MD;  Location: [facility] CATH LAB;  Service: Cardiovascular;  Laterality: N/A;  . Left heart catheterization with coronary/graft angiogram N/A 09/25/2013    Procedure: LEFT HEART CATHETERIZATION WITH Beatrix Fetters;  Surgeon: Blane Ohara, MD;  Location: [facility] CATH LAB;  Service: Cardiovascular;  Laterality: N/A;     Current Outpatient Prescriptions  Medication Sig Dispense Refill  . albuterol (PROVENTIL HFA;VENTOLIN HFA) 108 (90 BASE) MCG/ACT inhaler Inhale 2 puffs into the lungs every 6 (six) hours as needed for wheezing or shortness of breath. 1 Inhaler 0  . apixaban (ELIQUIS) 5 MG TABS tablet Take 1 tablet (5 mg total) by mouth 2 (two) times daily. 56 tablet 0  . apixaban (ELIQUIS) 5 MG TABS tablet Take 1 tablet (5 mg total) by mouth 2 (two) times daily. 56 tablet 0  . carvedilol (COREG) 12.5 MG tablet TAKE 1 TABLET (12.5 MG TOTAL) BY MOUTH 2 (TWO) TIMES DAILY. 180 tablet 0  . furosemide (LASIX) 40 MG tablet Take 1 tablet (40 mg total) by mouth daily.    . isosorbide mononitrate (IMDUR) 60 MG 24 hr tablet Take 1.5 tablets (90 mg total) by mouth daily. 135 tablet 3  . losartan-hydrochlorothiazide (HYZAAR) 50-12.5 MG per tablet TAKE 1 TABLET BY MOUTH EVERY DAY 30 tablet 2  . nitroGLYCERIN (NITROSTAT) 0.4 MG SL tablet Place 0.4 mg under the tongue every 5 (five) minutes as needed (MAX 3 TABLETS). For chest pain.    [name] oxymetazoline (AFRIN) 0.05 % nasal spray Place 2 sprays into both nostrils 2 (two) times daily as needed for congestion. Use as needed for nose bleeds 15 mL 2  . simvastatin (ZOCOR) 20 MG tablet TAKE 1 TABLET BY MOUTH DAILY 30 tablet 5  . vitamin B-12 (CYANOCOBALAMIN) 500 MCG tablet Take 500 mcg by mouth daily.     No current facility-administered medications for this visit.    Allergies:    Phenazopyridine and Ramipril    Social History:  The patient  reports that he quit smoking about 29 years ago. He does not have any smokeless tobacco history on file. He reports that he does not drink alcohol or use illicit drugs.    ROS:  Please see the history of present illness.    Otherwise, review of systems positive for none.   All other systems are reviewed and negative.    PHYSICAL EXAM: VS:  BP 110/68 mmHg  Pulse 78  Ht 6\' 2"  (1.88 m)  Wt 245 lb 5 oz (111.273 kg)  BMI 31.48 kg/m2 , BMI Body mass index is 31.48 kg/(m^2).  General: Alert, oriented x3, no distress Head: no evidence of trauma, PERRL, EOMI, no exophtalmos or lid lag, no myxedema, no xanthelasma; normal ears, nose and oropharynx Neck: normal jugular venous pulsations and no hepatojugular reflux; brisk carotid pulses without delay and no carotid bruits Chest: clear to auscultation, no signs of consolidation by percussion or  palpation, normal fremitus, symmetrical and full respiratory excursions Cardiovascular: normal position and quality of the apical impulse, regular rhythm, normal first and paradoxically split second heart sounds, no murmurs, rubs or gallops Abdomen: no tenderness or distention, no masses by palpation, no abnormal pulsatility or arterial bruits, normal bowel sounds, no hepatosplenomegaly Extremities: no clubbing, cyanosis or edema; 2+ radial, ulnar and brachial pulses bilaterally; 2+ right femoral, posterior tibial and dorsalis pedis pulses; 2+ left femoral, posterior tibial and dorsalis pedis pulses; no subclavian or femoral bruits Neurological: grossly nonfocal Psych: euthymic mood, full affect   EKG:  EKG is ordered today. The ekg ordered today demonstrates  Atrial fibrillation, biventricular pacing, QTC 497 ms   Recent Labs: 06/25/2014: Hemoglobin 13.7; Platelets 191    Lipid Panel    Component Value Date/Time   CHOL 112 09/22/2013 0125   CHOL 139 11/20/2011 0624   TRIG 93 09/22/2013  0125   TRIG 108 11/20/2011 0624   HDL 32* 09/22/2013 0125   HDL 26* 11/20/2011 0624   CHOLHDL 3.5 09/22/2013 0125   VLDL 19 09/22/2013 0125   VLDL 22 11/20/2011 0624   LDLCALC 61 09/22/2013 0125   LDLCALC 91 11/20/2011 0624      Wt Readings from Last 3 Encounters:  02/20/15 245 lb 5 oz (111.273 kg)  12/28/14 240 lb (108.863 kg)  09/26/14 235 lb (106.595 kg)     ASSESSMENT AND PLAN:  1.  Combined systolic and diastolic heart failure, NYHA functional class II, euvolemic on a relatively low dose of loop diuretic. Stable weight. On appropriate angiotensin receptor blocker and beta blocker therapy  2.  Moderate ischemic cardiomyopathy, most recent estimated LVEF 45%  3.  CAD status post CABG and PCI of SVG to OM. Patent LIMA to LAD. Occluded native arteries and occluded SVG to RCA. Currently free of angina on beta blocker and long-acting nitrates.  More than a year has passed since his stent procedure. Will stop aspirin to reduce bleeding risk.  4.  Permanent atrial fibrillation on Eliquis anticoagulation. CHADSVasc 5. Previous difficulty with warfarin anticoagulation. No recent bleeding or embolic events.  5.  High-grade AV block  6. CRT-D (Medtronic Viva quad XT)  With normal function, followed by Dr. [name].  7.  Hyperlipidemia on statin therapy.  Last checked her LDL less than 70  8.  Essential hypertension with good control  9.  Congenital deafness    Current medicines are reviewed at length with the patient today.  The patient does not have concerns regarding medicines.  The following changes have been made:   Stop aspirin  Labs/ tests ordered today include:  Orders Placed This Encounter  Procedures  . EKG 12-Lead    Patient Instructions  Your physician wants you to follow-up in: 6 Months. You will receive a reminder letter in the mail two months in advance. If you don't receive a letter, please call our office to schedule the follow-up appointment.  Your  physician has recommended you make the following change in your medication: STOP Aspirin  Merry Christmas and [place]!!!       Mikael Spray, MD  02/22/2015 9:14 AM    Sanda Klein, MD, [facility] [phone] office [phone] pager

## 2015-02-22 DIAGNOSIS — I2510 Atherosclerotic heart disease of native coronary artery without angina pectoris: Secondary | ICD-10-CM | POA: Insufficient documentation

## 2015-02-22 DIAGNOSIS — Z951 Presence of aortocoronary bypass graft: Secondary | ICD-10-CM | POA: Insufficient documentation

## 2015-02-22 DIAGNOSIS — Z9861 Coronary angioplasty status: Secondary | ICD-10-CM | POA: Insufficient documentation

## 2015-02-28 ENCOUNTER — Other Ambulatory Visit: Payer: Self-pay | Admitting: Cardiovascular Disease

## 2015-02-28 NOTE — Telephone Encounter (Signed)
Rx(s) sent to pharmacy electronically.  

## 2015-03-05 ENCOUNTER — Encounter: Payer: Self-pay | Admitting: *Deleted

## 2015-03-05 ENCOUNTER — Encounter: Payer: Self-pay | Admitting: Internal Medicine

## 2015-03-05 ENCOUNTER — Ambulatory Visit (INDEPENDENT_AMBULATORY_CARE_PROVIDER_SITE_OTHER): Payer: PPO | Admitting: Internal Medicine

## 2015-03-05 VITALS — BP 138/78 | HR 86 | Ht 74.0 in | Wt 241.0 lb

## 2015-03-05 DIAGNOSIS — Z7901 Long term (current) use of anticoagulants: Secondary | ICD-10-CM | POA: Diagnosis not present

## 2015-03-05 DIAGNOSIS — I255 Ischemic cardiomyopathy: Secondary | ICD-10-CM

## 2015-03-05 DIAGNOSIS — I482 Chronic atrial fibrillation: Secondary | ICD-10-CM

## 2015-03-05 DIAGNOSIS — Z95810 Presence of automatic (implantable) cardiac defibrillator: Secondary | ICD-10-CM

## 2015-03-05 DIAGNOSIS — I5022 Chronic systolic (congestive) heart failure: Secondary | ICD-10-CM | POA: Diagnosis not present

## 2015-03-05 DIAGNOSIS — I4821 Permanent atrial fibrillation: Secondary | ICD-10-CM

## 2015-03-05 LAB — CUP PACEART INCLINIC DEVICE CHECK
Battery Remaining Longevity: 52 mo
Battery Voltage: 2.97 V
Brady Statistic AP VP Percent: 0 %
Brady Statistic AS VS Percent: 1.05 %
Brady Statistic RA Percent Paced: 0 %
Brady Statistic RV Percent Paced: 99.12 %
HighPow Impedance: 68 Ohm
HighPow Impedance: 68 Ohm
Implantable Lead Implant Date: 20000210
Implantable Lead Location: 753858
Implantable Lead Location: 753860
Implantable Lead Model: 4298
Implantable Lead Serial Number: 272469
Implantable Lead Serial Number: 413633
Lead Channel Impedance Value: 323 Ohm
Lead Channel Impedance Value: 323 Ohm
Lead Channel Impedance Value: 380 Ohm
Lead Channel Impedance Value: 399 Ohm
Lead Channel Impedance Value: 399 Ohm
Lead Channel Impedance Value: 399 Ohm
Lead Channel Impedance Value: 399 Ohm
Lead Channel Impedance Value: 399 Ohm
Lead Channel Impedance Value: 494 Ohm
Lead Channel Impedance Value: 665 Ohm
Lead Channel Impedance Value: 665 Ohm
Lead Channel Impedance Value: 703 Ohm
Lead Channel Impedance Value: 722 Ohm
Lead Channel Pacing Threshold Amplitude: 0.75 V
Lead Channel Pacing Threshold Amplitude: 1.125 V
Lead Channel Pacing Threshold Pulse Width: 0.4 ms
Lead Channel Sensing Intrinsic Amplitude: 17.4 mV
Lead Channel Setting Pacing Amplitude: 2 V
Lead Channel Setting Pacing Pulse Width: 0.6 ms
Lead Channel Setting Sensing Sensitivity: 0.3 mV
MDC IDC LEAD IMPLANT DT: 20000210
MDC IDC LEAD IMPLANT DT: 20141003
MDC IDC LEAD LOCATION: 753859
MDC IDC LEAD MODEL: 4244
MDC IDC LEAD MODEL: 4285
MDC IDC MSMT LEADCHNL LV IMPEDANCE VALUE: 380 Ohm
MDC IDC MSMT LEADCHNL LV IMPEDANCE VALUE: 399 Ohm
MDC IDC MSMT LEADCHNL LV IMPEDANCE VALUE: 437 Ohm
MDC IDC MSMT LEADCHNL LV IMPEDANCE VALUE: 437 Ohm
MDC IDC MSMT LEADCHNL LV IMPEDANCE VALUE: 494 Ohm
MDC IDC MSMT LEADCHNL LV IMPEDANCE VALUE: 646 Ohm
MDC IDC MSMT LEADCHNL LV IMPEDANCE VALUE: 646 Ohm
MDC IDC MSMT LEADCHNL LV IMPEDANCE VALUE: 703 Ohm
MDC IDC MSMT LEADCHNL LV IMPEDANCE VALUE: 703 Ohm
MDC IDC MSMT LEADCHNL LV IMPEDANCE VALUE: 703 Ohm
MDC IDC MSMT LEADCHNL LV IMPEDANCE VALUE: 722 Ohm
MDC IDC MSMT LEADCHNL LV PACING THRESHOLD AMPLITUDE: 1.125 V
MDC IDC MSMT LEADCHNL LV PACING THRESHOLD PULSEWIDTH: 0.6 ms
MDC IDC MSMT LEADCHNL LV PACING THRESHOLD PULSEWIDTH: 0.6 ms
MDC IDC MSMT LEADCHNL RA IMPEDANCE VALUE: 399 Ohm
MDC IDC MSMT LEADCHNL RA SENSING INTR AMPL: 0.9 mV
MDC IDC MSMT LEADCHNL RV IMPEDANCE VALUE: 399 Ohm
MDC IDC SESS DTM: 20161220172139
MDC IDC SET LEADCHNL LV PACING AMPLITUDE: 2.25 V
MDC IDC SET LEADCHNL RV PACING PULSEWIDTH: 0.4 ms
MDC IDC STAT BRADY AP VS PERCENT: 0 %
MDC IDC STAT BRADY AS VP PERCENT: 98.95 %

## 2015-03-05 NOTE — Assessment & Plan Note (Signed)
His ventricular rate is well controlled. He will continue Eliquis.

## 2015-03-05 NOTE — Assessment & Plan Note (Signed)
His symptoms remain class 2A. He will continue his current meds. I have asked him to maintain a low sodium diet.

## 2015-03-05 NOTE — Assessment & Plan Note (Signed)
His medtronic Biv device is working normally. Will recheck in several months.

## 2015-03-05 NOTE — Assessment & Plan Note (Signed)
He will continue Eliquis and stop ASA due to the bleeding risk.

## 2015-03-05 NOTE — Progress Notes (Signed)
HPI Thomas [name] returns today for followup. He is a pleasant 80 yo man with an ICM, chronic systolic CHF, LBBB, EF [id number]. He underwent BiV ICD implant over a year ago. In the interim, the patient is much improved. His chronic systolic heart failure has improved from class IIIB to class IIA and his LV function went from 30-35% up to 45-50%. He has had no syncope. He denies chest pain or peripheral edema. No ICD shock. He remains active. He admits to some dietary indiscretion. Allergies  Allergen Reactions  . Phenazopyridine Nausea Only and Other (See Comments)    Other Reaction: GI UPSET  . Ramipril Other (See Comments)     Current Outpatient Prescriptions  Medication Sig Dispense Refill  . albuterol (PROVENTIL HFA;VENTOLIN HFA) 108 (90 BASE) MCG/ACT inhaler Inhale 2 puffs into the lungs every 6 (six) hours as needed for wheezing or shortness of breath. 1 Inhaler 0  . apixaban (ELIQUIS) 5 MG TABS tablet Take 1 tablet (5 mg total) by mouth 2 (two) times daily. 56 tablet 0  . carvedilol (COREG) 12.5 MG tablet TAKE 1 TABLET (12.5 MG TOTAL) BY MOUTH 2 (TWO) TIMES DAILY. 180 tablet 0  . furosemide (LASIX) 40 MG tablet Take 1 tablet (40 mg total) by mouth daily.    . isosorbide mononitrate (IMDUR) 60 MG 24 hr tablet Take 1.5 tablets (90 mg total) by mouth daily. 135 tablet 3  . losartan-hydrochlorothiazide (HYZAAR) 50-12.5 MG tablet TAKE 1 TABLET BY MOUTH EVERY DAY 30 tablet 6  . oxymetazoline (AFRIN) 0.05 % nasal spray Place 2 sprays into both nostrils 2 (two) times daily as needed for congestion. Use as needed for nose bleeds 15 mL 2  . simvastatin (ZOCOR) 20 MG tablet TAKE 1 TABLET BY MOUTH DAILY 30 tablet 5  . vitamin B-12 (CYANOCOBALAMIN) 500 MCG tablet Take 500 mcg by mouth daily.     No current facility-administered medications for this visit.     Past Medical History  Diagnosis Date  . Coronary artery disease     a. s/p CABG 1986. b. Multiple PCIs/caths. c. 09/2013: s/p PTCA and  BMS to SVG-OM.  [name] PAF (paroxysmal atrial fibrillation) ([place])   . History of bleeding peptic ulcer 1980  . History of abdominal aortic aneurysm   . History of epididymitis 2013  . Testicular swelling   . Hydronephrosis with ureteropelvic junction obstruction   . Hydroureter on left 2009  . Status post coronary artery bypass grafting 1986    LIMA to the LAD, SVG to OM, SVG to RCA  . Biventricular ICD (implantable cardioverter-defibrillator) in place 03/24/2005    Implantation of a [place], serial number [id number]  . Ischemic cardiomyopathy     a. Prior EF 30-35%, s/p BIV-ICD. b. 09/2013: EF 45-50%.  . Deaf   . CKD (chronic kidney disease), stage III   . Moderate tricuspid regurgitation   . HTN (hypertension)     ROS:   All systems reviewed and negative except as noted in the HPI.   Past Surgical History  Procedure Laterality Date  . Coronary artery bypass graft  1986  . Insert / replace / remove pacemaker    . Transurethral resection of prostate      s/p  . Cardiac catheterization  12/10/2011    SVG to OM widely patent.  LIMA to LAD patent  . 2-d echocardiogram  11/20/2011    Ejection fraction 30-35% moderate concentric left ventricular hypertrophy. Left atrium is moderately  dilated. Mild MR. Mild or  . Persantine myoview  05/06/2010    Post-rest ejection fraction 30%. No significant ischemia demonstrated. Compared to previous study there is no significant change.  . Left heart catheterization with coronary/graft angiogram N/A 12/10/2011    Procedure: LEFT HEART CATHETERIZATION WITH Beatrix Fetters;  Surgeon: Sanda Klein, MD;  Location: [place] CATH LAB;  Service: Cardiovascular;  Laterality: N/A;  . Bi-ventricular implantable cardioverter defibrillator N/A 12/16/2012    Procedure: BI-VENTRICULAR IMPLANTABLE CARDIOVERTER DEFIBRILLATOR  (CRT-D);  Surgeon: [name], MD;  Location: [facility] CATH LAB;  Service: Cardiovascular;  Laterality: N/A;  . Left heart  catheterization with coronary/graft angiogram N/A 09/25/2013    Procedure: LEFT HEART CATHETERIZATION WITH Beatrix Fetters;  Surgeon: Blane Ohara, MD;  Location: [facility] CATH LAB;  Service: Cardiovascular;  Laterality: N/A;     History reviewed. No pertinent family history.   Social History   Social History  . Marital Status: Married    Spouse Name: N/A  . Number of Children: N/A  . Years of Education: N/A   Occupational History  . Not on file.   Social History Main Topics  . Smoking status: Former Smoker    Quit date: 03/15/1985  . Smokeless tobacco: Not on file  . Alcohol Use: No     Comment: occas.  . Drug Use: No  . Sexual Activity: Not Currently   Other Topics Concern  . Not on file   Social History Narrative     BP 138/78 mmHg  Pulse 86  Ht 6\' 2"  (1.88 m)  Wt 241 lb (109.317 kg)  BMI 30.93 kg/m2  Physical Exam:  Well appearing 80 yo man, NAD HEENT: Unremarkable Neck:  6 cm JVD, no thyromegally Lymphatics:  No adenopathy Back:  No CVA tenderness Lungs:  Clear with no wheezes HEART:  Regular rate rhythm, no murmurs, no rubs, no clicks Abd:  soft, positive bowel sounds, no organomegally, no rebound, no guarding Ext:  2 plus pulses, no edema, no cyanosis, no clubbing Skin:  No rashes no nodules Neuro:  CN II through XII intact, motor grossly intact  DEVICE  Normal device function.  See PaceArt for details.   Assess/Plan:

## 2015-03-05 NOTE — Patient Instructions (Signed)
Medication Instructions:  Your physician recommends that you continue on your current medications as directed. Please refer to the Current Medication list given to you today.   Labwork: None ordered   Testing/Procedures: None ordered   Follow-Up: Your physician wants you to follow-up in: 12 months with Dr Knox Saliva will receive a reminder letter in the mail two months in advance. If you don't receive a letter, please call our office to schedule the follow-up appointment.   Remote monitoring is used to monitor your  ICD from home. This monitoring reduces the number of office visits required to check your device to one time per year. It allows [place] to keep an eye on the functioning of your device to ensure it is working properly. You are scheduled for a device check from home on 06/04/15. You may send your transmission at any time that day. If you have a wireless device, the transmission will be sent automatically. After your physician reviews your transmission, you will receive a postcard with your next transmission date.    Any Other Special Instructions Will Be Listed Below (If Applicable).     If you need a refill on your cardiac medications before your next appointment, please call your pharmacy.

## 2015-03-12 ENCOUNTER — Other Ambulatory Visit: Payer: Self-pay | Admitting: Cardiovascular Disease

## 2015-03-12 NOTE — Telephone Encounter (Signed)
Rx request sent to pharmacy.  

## 2015-03-24 ENCOUNTER — Other Ambulatory Visit: Payer: Self-pay | Admitting: Cardiovascular Disease

## 2015-03-25 NOTE — Telephone Encounter (Signed)
Rx(s) sent to pharmacy electronically.  

## 2015-03-28 ENCOUNTER — Telehealth: Payer: Self-pay | Admitting: *Deleted

## 2015-03-28 NOTE — Telephone Encounter (Signed)
Eliquis samples put back in closet.

## 2015-04-02 ENCOUNTER — Telehealth: Payer: Self-pay | Admitting: Cardiovascular Disease

## 2015-04-02 MED ORDER — ALBUTEROL SULFATE HFA 108 (90 BASE) MCG/ACT IN AERS: 2.0000 | INHALATION_SPRAY | Freq: Four times a day (QID) | RESPIRATORY_TRACT | Status: DC | PRN

## 2015-04-02 NOTE — Telephone Encounter (Signed)
Spoke to sign language interpreter who had called in on behalf of patient. Refilled proair for patient at request. Pt denies acute concerns. This was last filled by ED physician in October.  Advised we will refill today, Dr. Sallyanne Kuster may want PCP to fill this next time. Will route for FYI.

## 2015-04-11 ENCOUNTER — Telehealth: Payer: Self-pay | Admitting: Internal Medicine

## 2015-04-11 MED ORDER — APIXABAN 5 MG PO TABS: 5.0000 mg | ORAL_TABLET | Freq: Two times a day (BID) | ORAL | Status: DC

## 2015-04-11 NOTE — Telephone Encounter (Signed)
Follow up      Calling because no one has called regarding samples.  I assured the pt we received the message and someone would call.  OK to leave message regarding samples

## 2015-04-11 NOTE — Telephone Encounter (Signed)
Returned call via deaf telephone relay service and informed we have samples available at the [place] office. Wife acknowledged understanding.

## 2015-04-11 NOTE — Telephone Encounter (Signed)
New message  ° ° ° °Patient calling the office for samples of medication: ° ° °1.  What medication and dosage are you requesting samples for? eliquis 5 mg  ° °2.  Are you currently out of this medication? Yes  ° °

## 2015-05-01 DIAGNOSIS — M25562 Pain in left knee: Secondary | ICD-10-CM | POA: Diagnosis not present

## 2015-05-01 DIAGNOSIS — M1712 Unilateral primary osteoarthritis, left knee: Secondary | ICD-10-CM | POA: Diagnosis not present

## 2015-05-04 ENCOUNTER — Other Ambulatory Visit: Payer: Self-pay | Admitting: Cardiovascular Disease

## 2015-05-06 DIAGNOSIS — M1712 Unilateral primary osteoarthritis, left knee: Secondary | ICD-10-CM | POA: Diagnosis not present

## 2015-05-06 NOTE — Telephone Encounter (Signed)
Rx(s) sent to pharmacy electronically.  

## 2015-05-23 DIAGNOSIS — W19XXXA Unspecified fall, initial encounter: Secondary | ICD-10-CM | POA: Diagnosis not present

## 2015-05-23 DIAGNOSIS — Y92099 Unspecified place in other non-institutional residence as the place of occurrence of the external cause: Secondary | ICD-10-CM | POA: Diagnosis not present

## 2015-05-23 DIAGNOSIS — S59911A Unspecified injury of right forearm, initial encounter: Secondary | ICD-10-CM | POA: Diagnosis not present

## 2015-05-23 DIAGNOSIS — M19021 Primary osteoarthritis, right elbow: Secondary | ICD-10-CM | POA: Diagnosis not present

## 2015-05-23 DIAGNOSIS — Z23 Encounter for immunization: Secondary | ICD-10-CM | POA: Diagnosis not present

## 2015-06-04 ENCOUNTER — Ambulatory Visit (INDEPENDENT_AMBULATORY_CARE_PROVIDER_SITE_OTHER): Payer: PPO | Admitting: *Deleted

## 2015-06-04 DIAGNOSIS — I255 Ischemic cardiomyopathy: Secondary | ICD-10-CM

## 2015-06-04 DIAGNOSIS — I5022 Chronic systolic (congestive) heart failure: Secondary | ICD-10-CM | POA: Diagnosis not present

## 2015-06-05 NOTE — Progress Notes (Signed)
Remote ICD transmission.   

## 2015-06-06 ENCOUNTER — Other Ambulatory Visit: Payer: Self-pay | Admitting: Cardiovascular Disease

## 2015-06-06 NOTE — Telephone Encounter (Signed)
Medication samples have been provided to the patient.  Drug name: Eliquis 5 mg  Qty: 56 tab (4 boxes)  LOT: [id number]  Exp.Date: 05/19  Samples left at front desk for patient pick-up. Patient notified. Left message through interpreter that samples are ready.  Sheral Apley M 5:07 PM 06/06/2015

## 2015-06-06 NOTE — Telephone Encounter (Signed)
Patient calling the office for samples of medication: ° ° °1.  What medication and dosage are you requesting samples for? °Eliquis 5 mg  °2.  Are you currently out of this medication?  °Yes ° ° ° °

## 2015-06-10 ENCOUNTER — Telehealth: Payer: Self-pay | Admitting: Internal Medicine

## 2015-06-10 NOTE — Telephone Encounter (Signed)
samples for eliquis 5 mg & PA form placed up front for pt. called wife and she expressed understanding.

## 2015-06-10 NOTE — Telephone Encounter (Signed)
New Message  Patient wife calling the office for samples of medication:   1.  What medication and dosage are you requesting samples for? Eliquis   2.  Are you currently out of this medication?  Yes    Pt wife states that she has to leave by 1:30p please call before then

## 2015-06-13 DIAGNOSIS — J208 Acute bronchitis due to other specified organisms: Secondary | ICD-10-CM | POA: Diagnosis not present

## 2015-07-04 LAB — CUP PACEART REMOTE DEVICE CHECK
Battery Remaining Longevity: 54 mo
Brady Statistic AP VS Percent: 0 %
Brady Statistic AS VS Percent: 1.3 %
Brady Statistic RA Percent Paced: 0 %
Date Time Interrogation Session: 20170321073523
HighPow Impedance: 67 Ohm
Implantable Lead Implant Date: 20000210
Implantable Lead Implant Date: 20141003
Implantable Lead Location: 753858
Implantable Lead Location: 753860
Implantable Lead Model: 4244
Implantable Lead Serial Number: 413633
Lead Channel Impedance Value: 323 Ohm
Lead Channel Impedance Value: 399 Ohm
Lead Channel Impedance Value: 399 Ohm
Lead Channel Impedance Value: 494 Ohm
Lead Channel Impedance Value: 665 Ohm
Lead Channel Impedance Value: 703 Ohm
Lead Channel Pacing Threshold Amplitude: 1.375 V
Lead Channel Sensing Intrinsic Amplitude: 0.875 mV
Lead Channel Sensing Intrinsic Amplitude: 8 mV
Lead Channel Setting Pacing Pulse Width: 0.4 ms
Lead Channel Setting Sensing Sensitivity: 0.3 mV
MDC IDC LEAD IMPLANT DT: 20000210
MDC IDC LEAD LOCATION: 753859
MDC IDC LEAD SERIAL: 272469
MDC IDC MSMT BATTERY VOLTAGE: 2.97 V
MDC IDC MSMT LEADCHNL LV IMPEDANCE VALUE: 342 Ohm
MDC IDC MSMT LEADCHNL LV IMPEDANCE VALUE: 380 Ohm
MDC IDC MSMT LEADCHNL LV IMPEDANCE VALUE: 437 Ohm
MDC IDC MSMT LEADCHNL LV IMPEDANCE VALUE: 665 Ohm
MDC IDC MSMT LEADCHNL LV IMPEDANCE VALUE: 665 Ohm
MDC IDC MSMT LEADCHNL LV IMPEDANCE VALUE: 665 Ohm
MDC IDC MSMT LEADCHNL LV PACING THRESHOLD PULSEWIDTH: 0.6 ms
MDC IDC MSMT LEADCHNL RA IMPEDANCE VALUE: 399 Ohm
MDC IDC MSMT LEADCHNL RA SENSING INTR AMPL: 0.875 mV
MDC IDC MSMT LEADCHNL RV PACING THRESHOLD AMPLITUDE: 0.75 V
MDC IDC MSMT LEADCHNL RV PACING THRESHOLD PULSEWIDTH: 0.4 ms
MDC IDC MSMT LEADCHNL RV SENSING INTR AMPL: 8 mV
MDC IDC SET LEADCHNL LV PACING AMPLITUDE: 2.5 V
MDC IDC SET LEADCHNL LV PACING PULSEWIDTH: 0.6 ms
MDC IDC SET LEADCHNL RV PACING AMPLITUDE: 2 V
MDC IDC STAT BRADY AP VP PERCENT: 0 %
MDC IDC STAT BRADY AS VP PERCENT: 98.7 %
MDC IDC STAT BRADY RV PERCENT PACED: 98.87 %

## 2015-07-10 IMAGING — CT CT ABD-PELV W/ CM
2 of 5 series · 15 of 46 positions shown, 17 images · IV contrast (isovue)
Comparison: 07/14/2011.  07/11/2011

CLINICAL DATA: awoke 755am with mid abd cramping, passed rectal
blood x 1; denies hx of same. no abd surgeries. hx: cardiac stent,
CABG X 4, pacemaker. Left lower quadrant pain with rectal bleeding.
Initial encounter.

EXAM:
CT ABDOMEN AND PELVIS WITH CONTRAST
TECHNIQUE: Multidetector CT imaging of the abdomen and pelvis was performed
using the standard protocol following bolus administration of
intravenous contrast.
CONTRAST:  100 cc Isovue 300

[Series 2: routine abd pel with · axial · 0.84mm/px · z∈[+196,+682]mm · 12 of 109 slices shown, 14 images]
[im 6/109  soft-tissue]
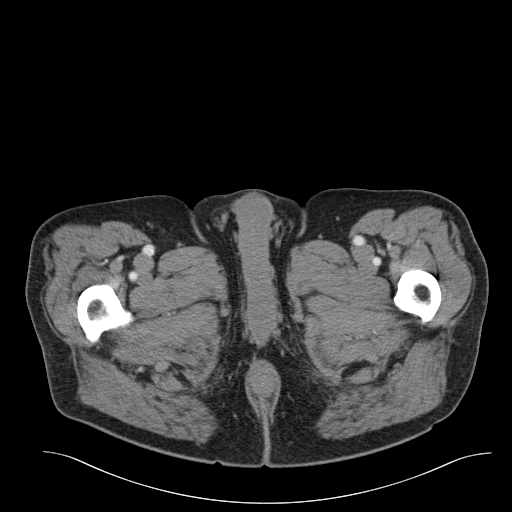
[im 6/109  bone]
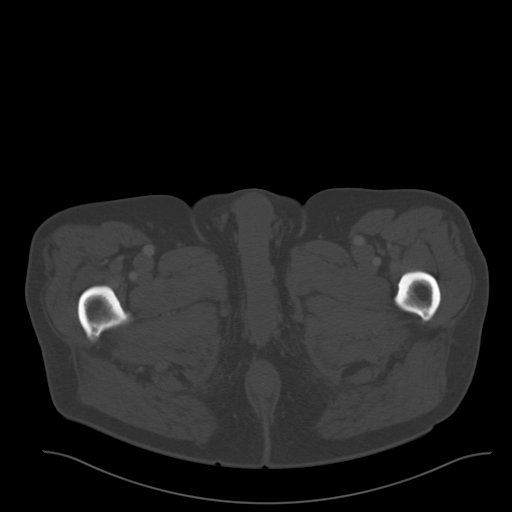
[im 16/109  soft-tissue]
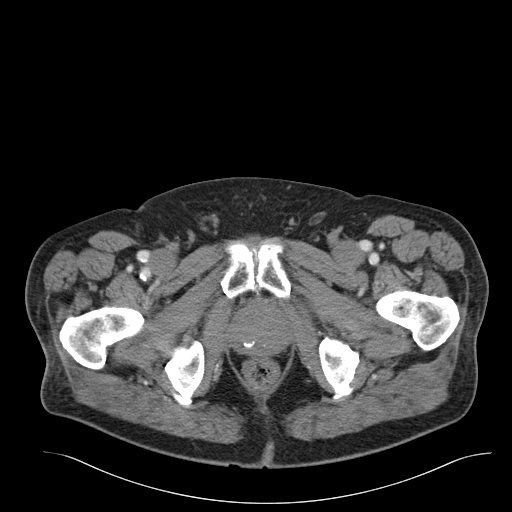
[im 26/109  soft-tissue]
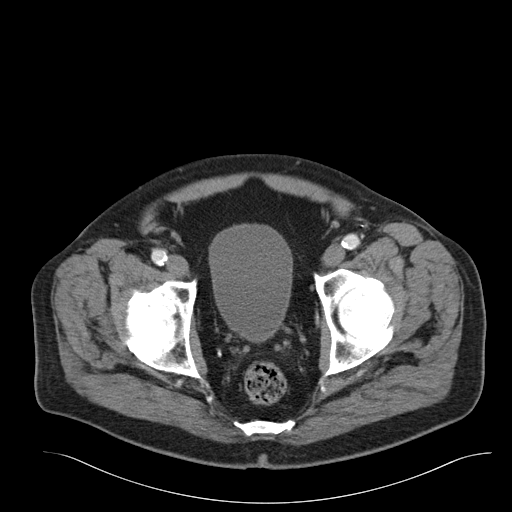
[im 31/109  soft-tissue]
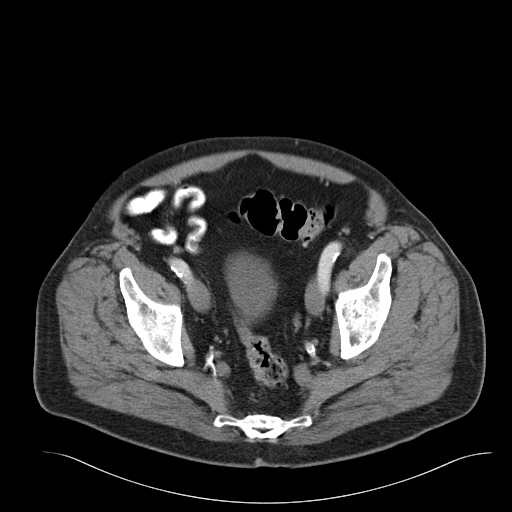
[im 42/109  soft-tissue]
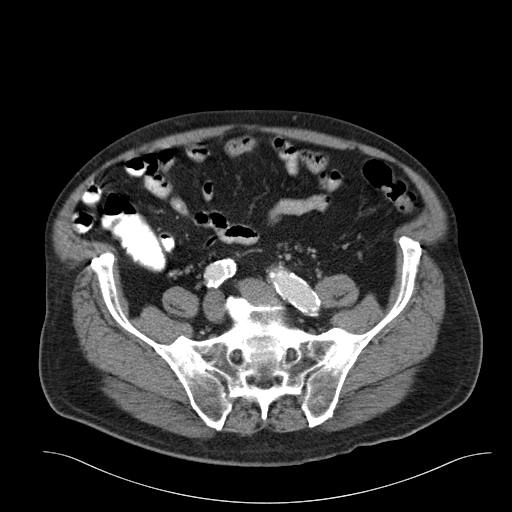
[im 52/109  soft-tissue]
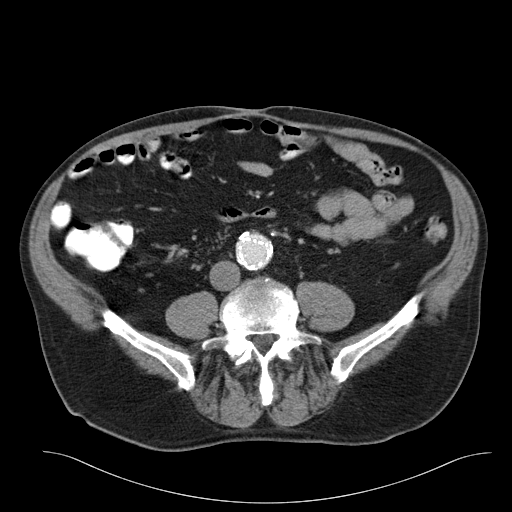
[im 57/109  soft-tissue]
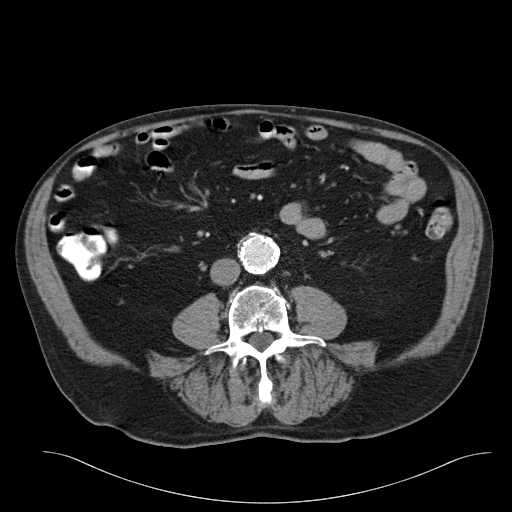
[im 67/109  soft-tissue]
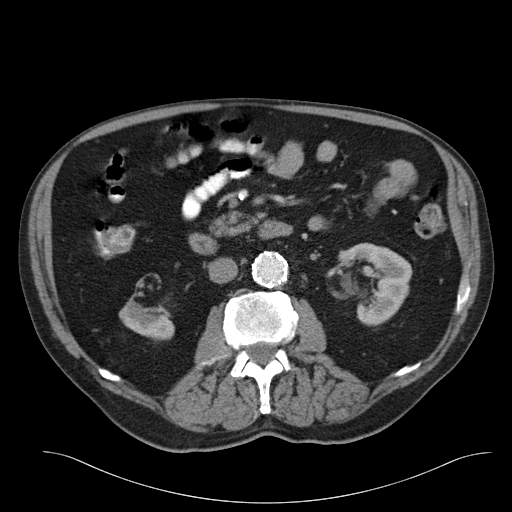
[im 78/109  soft-tissue]
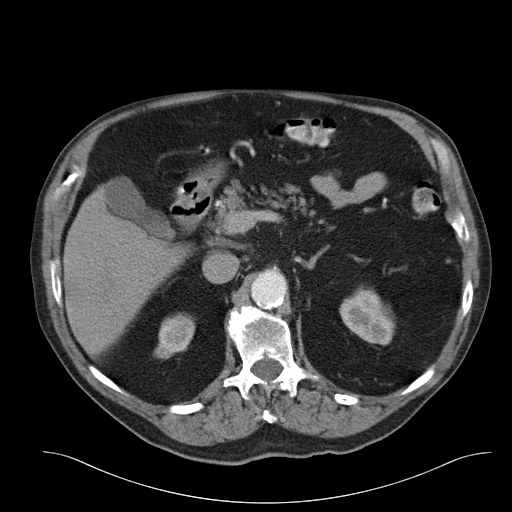
[im 78/109  bone]
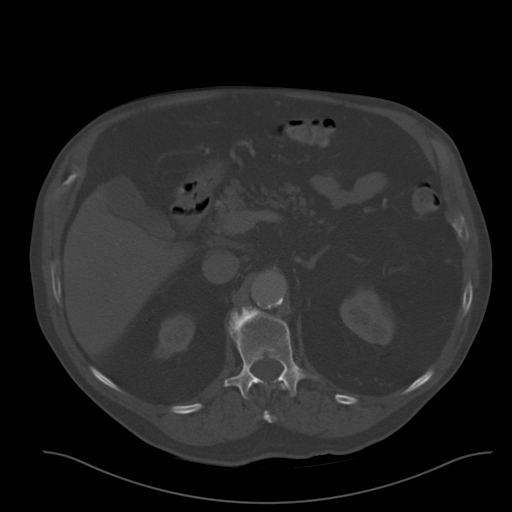
[im 83/109  soft-tissue]
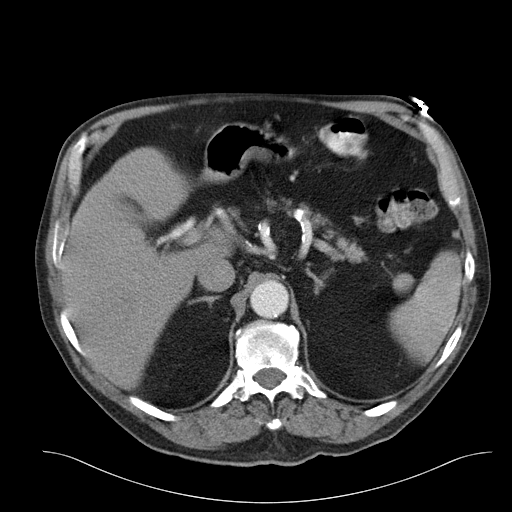
[im 93/109  soft-tissue]
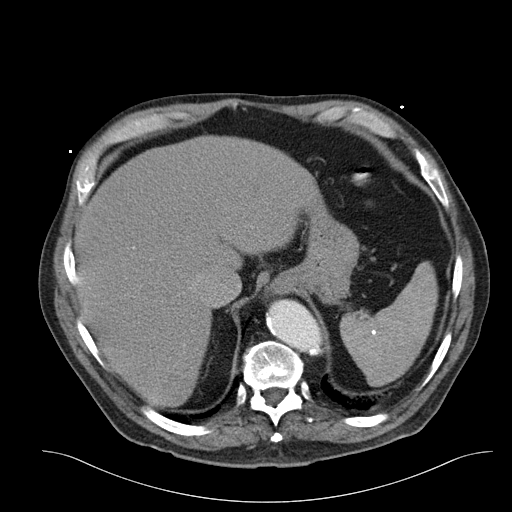
[im 103/109  soft-tissue]
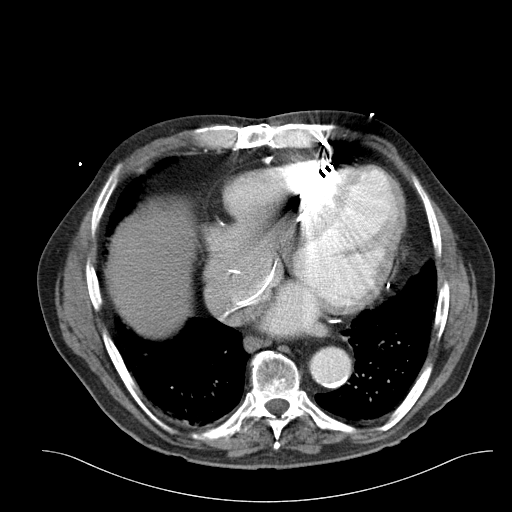

[Series 6: cor routine abd pel with · coronal · 0.81mm/px · 3 of 149 slices shown]
[im 50/149  soft-tissue]
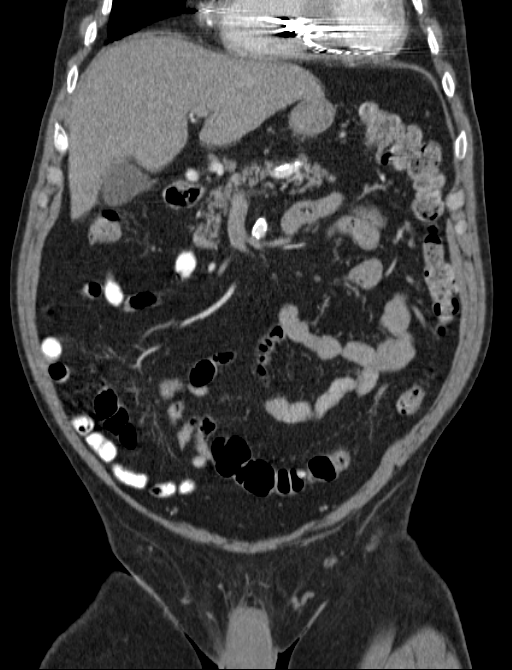
[im 66/149  soft-tissue]
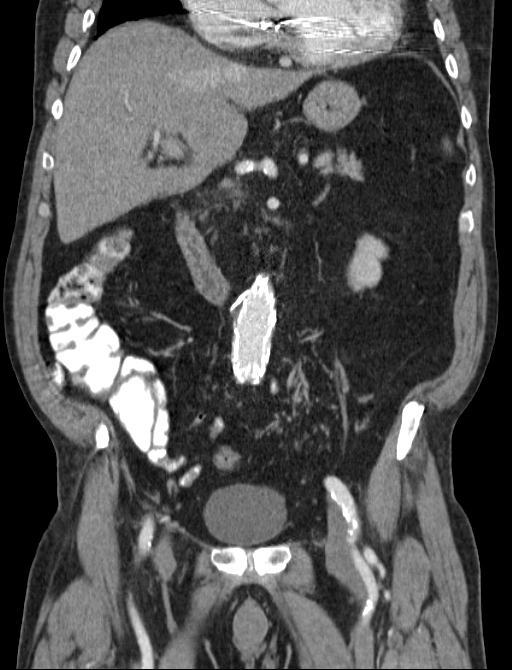
[im 83/149  soft-tissue]
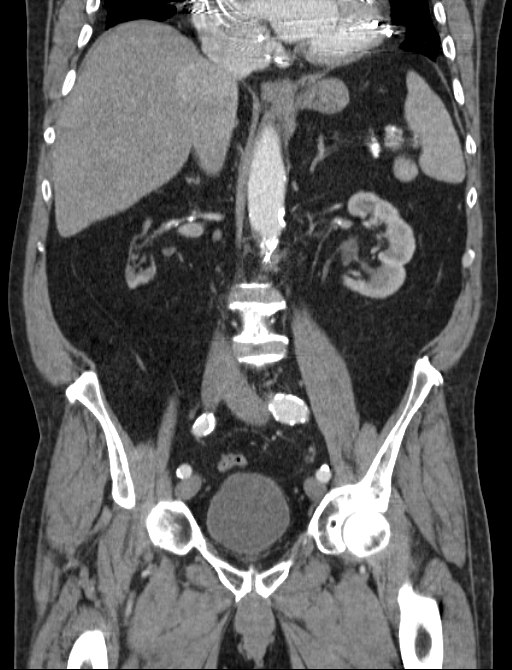

[15 of 46 positions shown; findings below may reference images not displayed]

FINDINGS: Lower chest: Mild motion degradation at the lung bases. Grossly
clear. Moderate to marked cardiomegaly with prior median sternotomy
for CABG. Pacer in the AICD device.

Hepatobiliary: Reflux of contrast into the IVC and hepatic veins,
suggesting elevated heart pressures. Mild hepatic steatosis and
hepatomegaly, 18.2 cm craniocaudal. Motion degradation continuing
into the abdomen. A left liver lobe low-density lesion measures
cm and is similar back to 07/11/2011, likely a cyst or minimally
complex cyst. Normal gallbladder, without biliary ductal dilatation.

Pancreas: Normal, without mass or pancreatic ductal dilatation.

Spleen: Splenule.  Old granulomatous disease within.

Adrenals/Urinary Tract: Normal left adrenal gland. Mild right
adrenal nodularity which is unchanged and likely due to an
underlying adenoma. Moderately scarred right kidney. Mild left renal
cortical thinning. Interpolar left renal lesion which is less than a
cm and most likely a cyst. No hydronephrosis. Normal urinary
bladder.

Stomach/Bowel: Normal stomach, without wall thickening. Scattered
colonic diverticula. Normal terminal ileum and appendix. Normal
small bowel.

Vascular/Lymphatic: Aortic and branch vessel atherosclerosis.
Multiple left renal arteries. Infrarenal abdominal aortic stent
graft repair. No retroperitoneal or retrocrural adenopathy.
Bilateral common iliac artery ectasia. 1.8 cm on the left and 1.7 cm
on the right. This is unchanged. Left internal iliac artery
dilatation at 1.4 cm is also similar. No pelvic adenopathy.

Reproductive: Radiation seeds in the prostate.

Other:  No significant free fluid.

Musculoskeletal: Degenerative partial fusion of the bilateral
sacroiliac joints.
IMPRESSION: 1. Mildly motion degraded exam.
2.  No acute process in the abdomen or pelvis.
3. Cardiomegaly with elevated right heart pressures.
4. Hepatomegaly and suspicion of mild hepatic steatosis.
5. Scarred right kidney.
6. Status post aortic endograft repair; similar dilatation of the
common iliac and left internal iliac arteries.

## 2015-07-15 ENCOUNTER — Telehealth: Payer: Self-pay | Admitting: Cardiovascular Disease

## 2015-07-15 NOTE — Telephone Encounter (Signed)
Pt called for samples, left Eliquis  5mg  up front for pt.

## 2015-07-15 NOTE — Telephone Encounter (Signed)
New message      Patient calling the office for samples of medication:   1.  What medication and dosage are you requesting samples for? eliquis 5mg   2.  Are you currently out of this medication? Have a few left

## 2015-07-16 DIAGNOSIS — R319 Hematuria, unspecified: Secondary | ICD-10-CM | POA: Diagnosis not present

## 2015-07-16 DIAGNOSIS — R399 Unspecified symptoms and signs involving the genitourinary system: Secondary | ICD-10-CM | POA: Diagnosis not present

## 2015-07-16 DIAGNOSIS — R109 Unspecified abdominal pain: Secondary | ICD-10-CM | POA: Diagnosis not present

## 2015-07-23 ENCOUNTER — Encounter: Payer: Self-pay | Admitting: Cardiovascular Disease

## 2015-08-06 ENCOUNTER — Encounter: Payer: Self-pay | Admitting: Cardiology

## 2015-08-19 ENCOUNTER — Encounter: Payer: Self-pay | Admitting: Urology

## 2015-08-19 ENCOUNTER — Ambulatory Visit (INDEPENDENT_AMBULATORY_CARE_PROVIDER_SITE_OTHER): Payer: PPO | Admitting: Urology

## 2015-08-19 VITALS — BP 149/82 | HR 65 | Ht 74.0 in | Wt 233.4 lb

## 2015-08-19 DIAGNOSIS — R310 Gross hematuria: Secondary | ICD-10-CM

## 2015-08-19 DIAGNOSIS — N200 Calculus of kidney: Secondary | ICD-10-CM | POA: Insufficient documentation

## 2015-08-19 DIAGNOSIS — H9193 Unspecified hearing loss, bilateral: Secondary | ICD-10-CM | POA: Insufficient documentation

## 2015-08-19 DIAGNOSIS — Z950 Presence of cardiac pacemaker: Secondary | ICD-10-CM | POA: Insufficient documentation

## 2015-08-19 DIAGNOSIS — H269 Unspecified cataract: Secondary | ICD-10-CM | POA: Insufficient documentation

## 2015-08-19 DIAGNOSIS — E782 Mixed hyperlipidemia: Secondary | ICD-10-CM | POA: Insufficient documentation

## 2015-08-19 DIAGNOSIS — I219 Acute myocardial infarction, unspecified: Secondary | ICD-10-CM | POA: Insufficient documentation

## 2015-08-19 DIAGNOSIS — C61 Malignant neoplasm of prostate: Secondary | ICD-10-CM | POA: Diagnosis not present

## 2015-08-19 DIAGNOSIS — K279 Peptic ulcer, site unspecified, unspecified as acute or chronic, without hemorrhage or perforation: Secondary | ICD-10-CM | POA: Insufficient documentation

## 2015-08-19 DIAGNOSIS — I10 Essential (primary) hypertension: Secondary | ICD-10-CM | POA: Insufficient documentation

## 2015-08-19 DIAGNOSIS — R3129 Other microscopic hematuria: Secondary | ICD-10-CM

## 2015-08-19 DIAGNOSIS — M722 Plantar fascial fibromatosis: Secondary | ICD-10-CM | POA: Insufficient documentation

## 2015-08-19 DIAGNOSIS — K219 Gastro-esophageal reflux disease without esophagitis: Secondary | ICD-10-CM | POA: Insufficient documentation

## 2015-08-19 DIAGNOSIS — E785 Hyperlipidemia, unspecified: Secondary | ICD-10-CM | POA: Insufficient documentation

## 2015-08-19 LAB — MICROSCOPIC EXAMINATION: Bacteria, UA: NONE SEEN

## 2015-08-19 LAB — URINALYSIS, COMPLETE
BILIRUBIN UA: NEGATIVE
GLUCOSE, UA: NEGATIVE
Ketones, UA: NEGATIVE
Leukocytes, UA: NEGATIVE
Nitrite, UA: NEGATIVE
Protein, UA: NEGATIVE
Specific Gravity, UA: 1.025 (ref 1.005–1.030)
UUROB: 0.2 mg/dL (ref 0.2–1.0)
pH, UA: 5 (ref 5.0–7.5)

## 2015-08-19 NOTE — Progress Notes (Signed)
Patient seen today for microscopic hematuria. Referred by NP Cornetto.   1) MH - Patient underwent a May 2017 urinalysis which showed greater than 50 red blood cells per high-powered field. At the time patient noticed dark red urine and left lower back pain. He may seen a small stone pass. He reports stone passage 7-8 years ago as well. Exposure risks include possible radiation, see below. Also, remote smoker. No other exposure risk. Patient has no lower urinary tract symptoms. His UA today is normal. No microscopic hematuria or bacteria.  Past medical history significant for CHF, EF of 30%, eliquis use.  2) Prostate cancer   - there is a question whether the patient had prostate cancer. There is a diagnosis or discussion of prostate cancer and some of his notes but no details. On questioning the patient does recall a prostate biopsy in going to see a doctor "2 times per week". On exam the prostate is flat and feels radiated but he does not recall a diagnosis of prostate cancer nor any treatment for prostate cancer. There is an encounter at [facility] in 2011 on care everywhere with Dr. Terance Hart but there are no documents attached. A October 2011 PSA was 0.6.  His urologic history, past medical, surgical, family history, social history, allergies, medications were reviewed.  A 13 system review of systems was obtained and was negative.   Physical exam: Patient is in no acute distress. Abdominal exam reveals possible prior umbilical hernia. No other incisional scars. On digital rectal exam the prostate is flat and indurated consistent with radiation   Assessment/plan:   Microscopic hematuria-possible kidney stone passage. BUN and creatinine were sent, discussed nature risks benefits and alternatives to CT scan with IV contrast and cystoscopy and elected to proceed.  Prostate cancer-PSA was sent. Will get records sent over from [facility]. I suspect the patient was treated with radiation.  Cc: NP  Cornetto

## 2015-08-20 LAB — BUN+CREAT
BUN/Creatinine Ratio: 18 (ref 10–24)
BUN: 22 mg/dL (ref 8–27)
Creatinine, Ser: 1.23 mg/dL (ref 0.76–1.27)
GFR, EST AFRICAN AMERICAN: 65 mL/min/{1.73_m2} (ref >59)
GFR, EST NON AFRICAN AMERICAN: 56 mL/min/{1.73_m2} — AB (ref >59)

## 2015-08-20 LAB — PSA: Prostate Specific Ag, Serum: 0.4 ng/mL (ref 0.0–4.0)

## 2015-08-23 ENCOUNTER — Other Ambulatory Visit: Payer: Self-pay | Admitting: Cardiovascular Disease

## 2015-08-23 ENCOUNTER — Telehealth: Payer: Self-pay | Admitting: Internal Medicine

## 2015-08-23 NOTE — Telephone Encounter (Signed)
Spoke with pt interpreter service, interpreter [phone], left message that pt samples are available for pick up.

## 2015-08-23 NOTE — Telephone Encounter (Signed)
Rx request sent to pharmacy.  

## 2015-08-23 NOTE — Telephone Encounter (Signed)
New message       Patient calling the office for samples of medication:   1.  What medication and dosage are you requesting samples for?Eliquis 5 mg po twice daily  2.  Are you currently out of this medication? yes  The wife is getting off at 3:30pm if she can come by and pick it up

## 2015-08-30 ENCOUNTER — Other Ambulatory Visit: Payer: Self-pay | Admitting: Cardiovascular Disease

## 2015-09-03 ENCOUNTER — Ambulatory Visit (INDEPENDENT_AMBULATORY_CARE_PROVIDER_SITE_OTHER): Payer: PPO | Admitting: *Deleted

## 2015-09-03 DIAGNOSIS — I5022 Chronic systolic (congestive) heart failure: Secondary | ICD-10-CM

## 2015-09-03 DIAGNOSIS — I255 Ischemic cardiomyopathy: Secondary | ICD-10-CM | POA: Diagnosis not present

## 2015-09-03 NOTE — Progress Notes (Signed)
Remote ICD transmission.   

## 2015-09-04 LAB — CUP PACEART REMOTE DEVICE CHECK
Battery Remaining Longevity: 50 mo
Battery Voltage: 2.97 V
Brady Statistic AS VP Percent: 99.45 %
Brady Statistic RA Percent Paced: 0 %
HIGH POWER IMPEDANCE MEASURED VALUE: 70 Ohm
Implantable Lead Implant Date: 20000210
Implantable Lead Location: 753858
Implantable Lead Location: 753859
Implantable Lead Location: 753860
Implantable Lead Model: 4244
Implantable Lead Model: 4298
Implantable Lead Serial Number: 413633
Lead Channel Impedance Value: 285 Ohm
Lead Channel Impedance Value: 342 Ohm
Lead Channel Impedance Value: 380 Ohm
Lead Channel Impedance Value: 399 Ohm
Lead Channel Impedance Value: 456 Ohm
Lead Channel Impedance Value: 646 Ohm
Lead Channel Impedance Value: 665 Ohm
Lead Channel Pacing Threshold Amplitude: 0.75 V
Lead Channel Pacing Threshold Pulse Width: 0.4 ms
Lead Channel Pacing Threshold Pulse Width: 0.6 ms
Lead Channel Sensing Intrinsic Amplitude: 8 mV
Lead Channel Setting Pacing Amplitude: 2.5 V
Lead Channel Setting Pacing Pulse Width: 0.4 ms
Lead Channel Setting Pacing Pulse Width: 0.6 ms
Lead Channel Setting Sensing Sensitivity: 0.3 mV
MDC IDC LEAD IMPLANT DT: 20000210
MDC IDC LEAD IMPLANT DT: 20141003
MDC IDC LEAD SERIAL: 272469
MDC IDC MSMT LEADCHNL LV IMPEDANCE VALUE: 399 Ohm
MDC IDC MSMT LEADCHNL LV IMPEDANCE VALUE: 437 Ohm
MDC IDC MSMT LEADCHNL LV IMPEDANCE VALUE: 665 Ohm
MDC IDC MSMT LEADCHNL LV IMPEDANCE VALUE: 665 Ohm
MDC IDC MSMT LEADCHNL LV IMPEDANCE VALUE: 722 Ohm
MDC IDC MSMT LEADCHNL LV PACING THRESHOLD AMPLITUDE: 1.375 V
MDC IDC MSMT LEADCHNL RA IMPEDANCE VALUE: 399 Ohm
MDC IDC MSMT LEADCHNL RA SENSING INTR AMPL: 0.875 mV
MDC IDC MSMT LEADCHNL RA SENSING INTR AMPL: 0.875 mV
MDC IDC MSMT LEADCHNL RV SENSING INTR AMPL: 8 mV
MDC IDC SESS DTM: 20170620073323
MDC IDC SET LEADCHNL RV PACING AMPLITUDE: 2 V
MDC IDC STAT BRADY AP VP PERCENT: 0 %
MDC IDC STAT BRADY AP VS PERCENT: 0 %
MDC IDC STAT BRADY AS VS PERCENT: 0.55 %
MDC IDC STAT BRADY RV PERCENT PACED: 99.5 %

## 2015-09-06 ENCOUNTER — Encounter: Payer: Self-pay | Admitting: Cardiology

## 2015-09-18 ENCOUNTER — Ambulatory Visit (INDEPENDENT_AMBULATORY_CARE_PROVIDER_SITE_OTHER): Payer: PPO | Admitting: Urology

## 2015-09-18 VITALS — BP 150/80 | HR 86 | Ht 74.0 in | Wt 237.0 lb

## 2015-09-18 DIAGNOSIS — N35912 Unspecified bulbous urethral stricture, male: Secondary | ICD-10-CM | POA: Insufficient documentation

## 2015-09-18 DIAGNOSIS — N99111 Postprocedural bulbous urethral stricture: Secondary | ICD-10-CM

## 2015-09-18 DIAGNOSIS — R3129 Other microscopic hematuria: Secondary | ICD-10-CM

## 2015-09-18 DIAGNOSIS — R310 Gross hematuria: Secondary | ICD-10-CM

## 2015-09-18 LAB — URINALYSIS, COMPLETE
Bilirubin, UA: NEGATIVE
GLUCOSE, UA: NEGATIVE
KETONES UA: NEGATIVE
LEUKOCYTES UA: NEGATIVE
Nitrite, UA: NEGATIVE
PROTEIN UA: NEGATIVE
SPEC GRAV UA: 1.02 (ref 1.005–1.030)
Urobilinogen, Ur: 0.2 mg/dL (ref 0.2–1.0)
pH, UA: 5 (ref 5.0–7.5)

## 2015-09-18 LAB — MICROSCOPIC EXAMINATION: BACTERIA UA: NONE SEEN

## 2015-09-18 MED ORDER — CIPROFLOXACIN HCL 500 MG PO TABS
500.0000 mg | ORAL_TABLET | Freq: Once | ORAL | Status: AC
  Administered 2015-09-18: 500 mg via ORAL

## 2015-09-18 MED ORDER — LIDOCAINE HCL 2 % EX GEL
1.0000 "application " | Freq: Once | CUTANEOUS | Status: AC
  Administered 2015-09-18: 1 via URETHRAL

## 2015-09-18 NOTE — Progress Notes (Signed)
Follow-up microscopic hematuria and history of prostate cancer.   1) MH - Patient underwent a May 2017 urinalysis which showed greater than 50 red blood cells per high-powered field. At the time patient noticed dark red urine and left lower back pain. He may seen a small stone pass. He reports stone passage 7-8 years ago as well. Exposure risks include possible radiation, see below. Also, remote smoker. No other exposure risk. Patient has no lower urinary tract symptoms. His UA today is normal. No microscopic hematuria or bacteria.   Patient is here for cystoscopy. I had planned for a CT scan of the abdomen and pelvis but don't see that that was done. His BUN and creatinine were normal (22 and 1.23).   Past medical history significant for CHF, EF of 30%, eliquis use.  2) Prostate cancer - there is a question whether the patient had prostate cancer. There is a diagnosis or discussion of prostate cancer and some of his notes but no details. On questioning the patient does recall a prostate biopsy and having a catheter placed and going to see a doctor "2 times per week" for a month. On June 2017 exam the prostate felt flat and radiated. There is an encounter at [facility] in 2011 on care everywhere with Dr. Terance Hart but there are no documents attached. AnOctober 2011 PSA was 0.6.   I checked a PSA last visit which was 0.4 -- June 2017  Procedure -- Cystoscopy: After consent was obtained patient was placed supine. The penis was prepped and draped in the usual sterile fashion. Cystoscope was passed per urethra and the urethra and bladder inspected. The scope was removed without difficulty. He was covered with a Cipro.  Findings-in the urethra there was a stricture in the bulb which I was carefully able to pop through with the scope. The prostatic urethra was patent but there was some neovascularity and friable vessels especially along the left side. The trigone and ureteral orifices were in their normal  orthotopic position. There was clear efflux bilaterally. The bladder appeared unremarkable. There were no tumors, stones or foreign bodies. There were no areas of erythema.  UA today again shows microscopic hematuria.  Assessment/plan:  #1 microscopic hematuria-will schedule patient for CT scan of the abdomen and pelvis. BUN and creatinine were resent.  #2 history of prostate cancer-prostate exam is normal, PSA low and there is some neovascularity in the prostatic urethra -- all consistent with prior radiation. Currently, I don't see any evidence of prostate cancer or prostate cancer recurrence.

## 2015-09-19 LAB — BUN+CREAT
BUN/Creatinine Ratio: 20 (ref 10–24)
BUN: 25 mg/dL (ref 8–27)
Creatinine, Ser: 1.22 mg/dL (ref 0.76–1.27)
GFR calc Af Amer: 65 mL/min/{1.73_m2} (ref >59)
GFR calc non Af Amer: 56 mL/min/{1.73_m2} — ABNORMAL LOW (ref >59)

## 2015-09-30 ENCOUNTER — Ambulatory Visit: Admission: RE | Admit: 2015-09-30 | Payer: PPO | Source: Ambulatory Visit

## 2015-10-04 ENCOUNTER — Other Ambulatory Visit: Payer: Self-pay | Admitting: *Deleted

## 2015-10-04 MED ORDER — CARVEDILOL 12.5 MG PO TABS: 12.5000 mg | ORAL_TABLET | Freq: Two times a day (BID) | ORAL | Status: DC

## 2015-10-09 ENCOUNTER — Telehealth: Payer: Self-pay | Admitting: Cardiovascular Disease

## 2015-10-09 NOTE — Telephone Encounter (Signed)
°*  STAT* If patient is at the pharmacy, call can be transferred to refill team.   1. Which medications need to be refilled? (please list name of each medication and dose if known) ELIQUIS 2mg   2. Which pharmacy/location (including street and city if local pharmacy) is medication to be sent to?pick up  3. Do they need a 30 day or 90 day supply?[place]

## 2015-10-10 ENCOUNTER — Telehealth: Payer: Self-pay | Admitting: Cardiovascular Disease

## 2015-10-10 NOTE — Telephone Encounter (Signed)
Attempted to call patient to let them know that samples will be placed at front desk for patient.  There was no answer with interpreter for home number and no voice mail set up on cell.

## 2015-10-10 NOTE — Telephone Encounter (Signed)
Pt called back and I informed the pt that we did not have any samples of Eliquis 5 mg tablets at the [address] office and that he could call to Dr. Victorino December office at [facility] to see if they have any at his office. Called pt back to inform him that Sharyn Lull, [place] had placed samples up at the front desk and that he could come by and pick them up. Pt verbalized understanding.

## 2015-10-10 NOTE — Telephone Encounter (Signed)
New message       Patient calling the office for samples of medication:   1.  What medication and dosage are you requesting samples for?Eliquis 5 mg po daily  2.  Are you currently out of this medication?  The pt is out of medication completely     The wife is going to be in [place] at 9 am today she was wondering if she could come by and pick-up the medication.

## 2015-10-10 NOTE — Telephone Encounter (Signed)
[facility] office does not have Samples of Eliquis. [facility] street office and they have samples. Routing call to [address].

## 2015-10-14 ENCOUNTER — Ambulatory Visit: Payer: PPO

## 2015-10-16 ENCOUNTER — Other Ambulatory Visit: Payer: Self-pay | Admitting: Cardiovascular Disease

## 2015-10-16 NOTE — Telephone Encounter (Signed)
Rx request sent to pharmacy.  

## 2015-10-21 ENCOUNTER — Ambulatory Visit
Admission: RE | Admit: 2015-10-21 | Discharge: 2015-10-21 | Disposition: A | Discharge status: Home / Self Care | Payer: PPO | Source: Ambulatory Visit | Attending: Urology | Admitting: Urology

## 2015-10-21 DIAGNOSIS — N400 Enlarged prostate without lower urinary tract symptoms: Secondary | ICD-10-CM | POA: Insufficient documentation

## 2015-10-21 DIAGNOSIS — N261 Atrophy of kidney (terminal): Secondary | ICD-10-CM | POA: Insufficient documentation

## 2015-10-21 DIAGNOSIS — R3129 Other microscopic hematuria: Secondary | ICD-10-CM | POA: Insufficient documentation

## 2015-10-21 DIAGNOSIS — N200 Calculus of kidney: Secondary | ICD-10-CM | POA: Insufficient documentation

## 2015-10-21 DIAGNOSIS — I700 Atherosclerosis of aorta: Secondary | ICD-10-CM | POA: Insufficient documentation

## 2015-10-21 DIAGNOSIS — Z95818 Presence of other cardiac implants and grafts: Secondary | ICD-10-CM | POA: Insufficient documentation

## 2015-10-21 HISTORY — DX: Malignant neoplasm of prostate: C61

## 2015-10-21 HISTORY — DX: Heart failure, unspecified: I50.9

## 2015-10-21 HISTORY — DX: Essential (primary) hypertension: I10

## 2015-10-21 MED ORDER — IOPAMIDOL (ISOVUE-300) INJECTION 61%
125.0000 mL | Freq: Once | INTRAVENOUS | Status: AC | PRN
  Administered 2015-10-21: 125 mL via INTRAVENOUS

## 2015-10-25 ENCOUNTER — Telehealth: Payer: Self-pay

## 2015-10-25 NOTE — Telephone Encounter (Signed)
Notify patient his CT looked normal. Of note, he has gold markers in the prostate which are used for radiation, so it looks like he DID have radiation. There was no sign of any prostate cancer on the CT> -Dr. Octavia Bruckner with pt in reference to CT results. Pt voiced understanding.

## 2015-10-28 ENCOUNTER — Ambulatory Visit (INDEPENDENT_AMBULATORY_CARE_PROVIDER_SITE_OTHER): Payer: PPO | Admitting: Urology

## 2015-10-28 VITALS — BP 128/77 | HR 83 | Ht 74.0 in | Wt 231.0 lb

## 2015-10-28 DIAGNOSIS — C61 Malignant neoplasm of prostate: Secondary | ICD-10-CM

## 2015-10-28 DIAGNOSIS — R3129 Other microscopic hematuria: Secondary | ICD-10-CM

## 2015-10-28 DIAGNOSIS — N200 Calculus of kidney: Secondary | ICD-10-CM | POA: Diagnosis not present

## 2015-10-28 DIAGNOSIS — N99111 Postprocedural bulbous urethral stricture: Secondary | ICD-10-CM | POA: Diagnosis not present

## 2015-10-28 NOTE — Progress Notes (Signed)
10/28/2015 6:51 AM   [name] 1934/08/17 [id number]  Referring provider: [name], MD [address] [facility] [place], [place] [address]    HPI:  1. Calculus of kidney - prior small stones passed medically x several. CT 10/2015 as part of hematuria eval with punctate left renal stone only (<61mm, non-obstructing)  2. Malignant neoplasm of prostate ([place]) - s/p external beam radiation many years ago for unkown grade / stage disease.  Recent Surveillance: 2011 - PSA 0.6 2017 - PSA 0.4 / CT [place]  3. Microhematuria - microblood on UA x many 2017. Eval 10/2015 with CT, Cysto with mild radiation cystitis changes of prostatic urethra and small non-obstructing stone. He is on eliquus for cardiac indication.   4. Postprocedural bulbous urethral stricture - short segment bulbar stricture noted at cysto 2017 and dilated with cystoscope. Denies bothersoem voiding before or after dilation.  PMH sig for CHF, CAD/CABG EF of 30%, eliquis use, deafness  Today "Vandy" is seen in f/u above. Interval CT very favorable w/o worrisome upper trat masses.   PMH: Past Medical History:  Diagnosis Date  . Biventricular ICD (implantable cardioverter-defibrillator) in place 03/24/2005   Implantation of a Medtronic Adapta ADDRO1, serial number [id number]  . CHF (congestive heart failure) ([place])   . CKD (chronic kidney disease), stage III   . Coronary artery disease    a. s/p CABG 1986. b. Multiple PCIs/caths. c. 09/2013: s/p PTCA and BMS to SVG-OM.  [name]   . History of abdominal aortic aneurysm   . History of bleeding peptic ulcer 1980  . History of epididymitis 2013  . HTN (hypertension)   . Hydronephrosis with ureteropelvic junction obstruction   . Hydroureter on left 2009  . Hypertension   . Ischemic cardiomyopathy    a. Prior EF 30-35%, s/p BIV-ICD. b. 09/2013: EF 45-50%.  . Moderate tricuspid regurgitation   . PAF (paroxysmal atrial fibrillation) ([place])   . Prostate cancer  ([place])   . Status post coronary artery bypass grafting 1986   LIMA to the LAD, SVG to OM, SVG to RCA  . Testicular swelling     Surgical History: Past Surgical History:  Procedure Laterality Date  . 2-D echocardiogram  11/20/2011   Ejection fraction 30-35% moderate concentric left ventricular hypertrophy. Left atrium is moderately dilated. Mild MR. Mild or  . BI-VENTRICULAR IMPLANTABLE CARDIOVERTER DEFIBRILLATOR N/A 12/16/2012   Procedure: BI-VENTRICULAR IMPLANTABLE CARDIOVERTER DEFIBRILLATOR  (CRT-D);  Surgeon: [name], MD;  Location: [facility] CATH LAB;  Service: Cardiovascular;  Laterality: N/A;  . CARDIAC CATHETERIZATION  12/10/2011   SVG to OM widely patent.  LIMA to LAD patent  . CORONARY ARTERY BYPASS GRAFT  1986  . INSERT / REPLACE / REMOVE PACEMAKER    . LEFT HEART CATHETERIZATION WITH CORONARY/GRAFT ANGIOGRAM N/A 12/10/2011   Procedure: LEFT HEART CATHETERIZATION WITH Beatrix Fetters;  Surgeon: Sanda Klein, MD;  Location: [place] CATH LAB;  Service: Cardiovascular;  Laterality: N/A;  . LEFT HEART CATHETERIZATION WITH CORONARY/GRAFT ANGIOGRAM N/A 09/25/2013   Procedure: LEFT HEART CATHETERIZATION WITH Beatrix Fetters;  Surgeon: Blane Ohara, MD;  Location: [facility] CATH LAB;  Service: Cardiovascular;  Laterality: N/A;  . Persantine Myoview  05/06/2010   Post-rest ejection fraction 30%. No significant ischemia demonstrated. Compared to previous study there is no significant change.  . TRANSURETHRAL RESECTION OF PROSTATE     s/p    Home Medications:    Medication List       Accurate as of 10/28/15  6:51 AM. Always use your most recent med list.          albuterol 108 (90 Base) MCG/ACT inhaler Commonly known as:  PROVENTIL HFA;VENTOLIN HFA Inhale 2 puffs into the lungs every 6 (six) hours as needed for wheezing or shortness of breath.   apixaban 5 MG Tabs tablet Commonly known as:  ELIQUIS Take 1 tablet (5 mg total) by mouth 2 (two) times daily.     carvedilol 12.5 MG tablet Commonly known as:  COREG Take 1 tablet (12.5 mg total) by mouth 2 (two) times daily with a meal. Please schedule appointment for refills.   furosemide 40 MG tablet Commonly known as:  LASIX Take 1 tablet (40 mg total) by mouth daily.   isosorbide mononitrate 60 MG 24 hr tablet Commonly known as:  IMDUR Take 1.5 tablets (90 mg total) by mouth daily.   losartan-hydrochlorothiazide 50-12.5 MG tablet Commonly known as:  HYZAAR TAKE 1 TABLET BY MOUTH EVERY DAY   oxymetazoline 0.05 % nasal spray Commonly known as:  AFRIN Place 2 sprays into both nostrils 2 (two) times daily as needed for congestion. Use as needed for nose bleeds   simvastatin 20 MG tablet Commonly known as:  ZOCOR TAKE 1 TABLET BY MOUTH DAILY   tamsulosin 0.4 MG Caps capsule Commonly known as:  FLOMAX TAKE 1 CAPSULE (0.4 MG TOTAL) BY MOUTH ONCE DAILY. TAKE 30 MINUTES AFTER SAME MEAL EACH DAY.   vitamin B-12 500 MCG tablet Commonly known as:  CYANOCOBALAMIN Take 500 mcg by mouth daily. Reported on 08/19/2015       Allergies:  Allergies  Allergen Reactions  . Phenazopyridine Nausea Only and Other (See Comments)    Other Reaction: GI UPSET  . Ramipril Other (See Comments)    Family History: No family history on file.  Social History:  reports that he quit smoking about 30 years ago. He does not have any smokeless tobacco history on file. He reports that he does not drink alcohol or use drugs.  ROS:   + LE edema NO hematuria NO GI symtpoms NO skin lesions NO headaches NO blurry vision      Physical Exam: There were no vitals taken for this visit.  Constitutional:  Alert and oriented, No acute distress. HEENT: [place] AT, moist mucus membranes.  Trachea midline, no masses. Cardiovascular: No clubbing, cyanosis, or edema. Respiratory: Normal respiratory effort, no increased work of breathing. GI: Abdomen is soft, nontender, nondistended, no abdominal masses GU: No CVA  tenderness.  Skin: No rashes, bruises or suspicious lesions. Lymph: No cervical or inguinal adenopathy. Neurologic: Grossly intact, no focal deficits, moving all 4 extremities. Psychiatric: Normal mood and affect.  Laboratory Data: Lab Results  Component Value Date   WBC 7.1 06/25/2014   HGB 13.7 06/25/2014   HCT 40.7 06/25/2014   MCV 92.1 06/25/2014   PLT 191 06/25/2014    Lab Results  Component Value Date   CREATININE 1.22 09/18/2015     Urinalysis    Component Value Date/Time   COLORURINE Yellow 11/19/2011 1501   APPEARANCEUR Clear 09/18/2015 0924   LABSPEC 1.021 11/19/2011 1501   PHURINE 5.0 11/19/2011 1501   GLUCOSEU Negative 09/18/2015 0924   GLUCOSEU Negative 11/19/2011 1501   HGBUR 1+ 11/19/2011 1501   BILIRUBINUR Negative 09/18/2015 0924   BILIRUBINUR Negative 11/19/2011 1501   KETONESUR Negative 11/19/2011 1501   PROTEINUR Negative 09/18/2015 0924   PROTEINUR 100 mg/dL 11/19/2011 1501   NITRITE Negative 09/18/2015 0924   NITRITE Negative  11/19/2011 1501   LEUKOCYTESUR Negative 09/18/2015 0924   LEUKOCYTESUR Negative 11/19/2011 1501    Pertinent Imaging: As per HPI  Assessment & Plan:    1. Calculus of kidney - present stone burden tiny and non-obstructive. Do not rec any therapy, would likely pass with medical therapy alone should it drop.  2. Malignant neoplasm of prostate ([place]) - good biochemical control s/p prior radiotherapy. Contiue yearly surveillance.   3. Microhematuria - likley from small non-obstructing stone and / or mild radiation changes. Consider repeat eval for gross episodes only.   4. Postprocedural bulbous urethral stricture - asymptomatic pre/post dilation. Warned to contact MD for progressive obstructive sympotms.  RTC 1 year with PSA prior and sign languane interpreter.    No Follow-up on file.  Alexis Frock, [facility] [address], [place], [place] [address] [phone]

## 2015-10-31 ENCOUNTER — Observation Stay
Admission: EM | Admit: 2015-10-31 | Discharge: 2015-11-01 | Disposition: A | Discharge status: Home / Self Care | Payer: PPO | Attending: Internal Medicine | Admitting: Internal Medicine

## 2015-10-31 ENCOUNTER — Emergency Department: Payer: PPO

## 2015-10-31 ENCOUNTER — Encounter: Payer: Self-pay | Admitting: Emergency Medicine

## 2015-10-31 ENCOUNTER — Observation Stay: Payer: PPO

## 2015-10-31 DIAGNOSIS — Z87891 Personal history of nicotine dependence: Secondary | ICD-10-CM | POA: Diagnosis not present

## 2015-10-31 DIAGNOSIS — I2510 Atherosclerotic heart disease of native coronary artery without angina pectoris: Secondary | ICD-10-CM | POA: Diagnosis not present

## 2015-10-31 DIAGNOSIS — N183 Chronic kidney disease, stage 3 (moderate): Secondary | ICD-10-CM | POA: Insufficient documentation

## 2015-10-31 DIAGNOSIS — C61 Malignant neoplasm of prostate: Secondary | ICD-10-CM | POA: Diagnosis not present

## 2015-10-31 DIAGNOSIS — I255 Ischemic cardiomyopathy: Secondary | ICD-10-CM | POA: Insufficient documentation

## 2015-10-31 DIAGNOSIS — K219 Gastro-esophageal reflux disease without esophagitis: Secondary | ICD-10-CM | POA: Insufficient documentation

## 2015-10-31 DIAGNOSIS — I700 Atherosclerosis of aorta: Secondary | ICD-10-CM | POA: Insufficient documentation

## 2015-10-31 DIAGNOSIS — I071 Rheumatic tricuspid insufficiency: Secondary | ICD-10-CM | POA: Insufficient documentation

## 2015-10-31 DIAGNOSIS — Z23 Encounter for immunization: Secondary | ICD-10-CM | POA: Diagnosis not present

## 2015-10-31 DIAGNOSIS — I63231 Cerebral infarction due to unspecified occlusion or stenosis of right carotid arteries: Secondary | ICD-10-CM | POA: Diagnosis not present

## 2015-10-31 DIAGNOSIS — I482 Chronic atrial fibrillation: Secondary | ICD-10-CM | POA: Insufficient documentation

## 2015-10-31 DIAGNOSIS — N359 Urethral stricture, unspecified: Secondary | ICD-10-CM | POA: Insufficient documentation

## 2015-10-31 DIAGNOSIS — M24576 Contracture, unspecified foot: Secondary | ICD-10-CM | POA: Insufficient documentation

## 2015-10-31 DIAGNOSIS — R29818 Other symptoms and signs involving the nervous system: Secondary | ICD-10-CM | POA: Diagnosis not present

## 2015-10-31 DIAGNOSIS — I69851 Hemiplegia and hemiparesis following other cerebrovascular disease affecting right dominant side: Principal | ICD-10-CM | POA: Insufficient documentation

## 2015-10-31 DIAGNOSIS — I639 Cerebral infarction, unspecified: Secondary | ICD-10-CM | POA: Diagnosis present

## 2015-10-31 DIAGNOSIS — K279 Peptic ulcer, site unspecified, unspecified as acute or chronic, without hemorrhage or perforation: Secondary | ICD-10-CM | POA: Insufficient documentation

## 2015-10-31 DIAGNOSIS — H9190 Unspecified hearing loss, unspecified ear: Secondary | ICD-10-CM | POA: Insufficient documentation

## 2015-10-31 DIAGNOSIS — I63232 Cerebral infarction due to unspecified occlusion or stenosis of left carotid arteries: Secondary | ICD-10-CM | POA: Diagnosis not present

## 2015-10-31 DIAGNOSIS — I252 Old myocardial infarction: Secondary | ICD-10-CM | POA: Insufficient documentation

## 2015-10-31 DIAGNOSIS — Z8679 Personal history of other diseases of the circulatory system: Secondary | ICD-10-CM | POA: Diagnosis not present

## 2015-10-31 DIAGNOSIS — I10 Essential (primary) hypertension: Secondary | ICD-10-CM | POA: Diagnosis not present

## 2015-10-31 DIAGNOSIS — I441 Atrioventricular block, second degree: Secondary | ICD-10-CM | POA: Insufficient documentation

## 2015-10-31 DIAGNOSIS — R42 Dizziness and giddiness: Secondary | ICD-10-CM

## 2015-10-31 DIAGNOSIS — Z8249 Family history of ischemic heart disease and other diseases of the circulatory system: Secondary | ICD-10-CM | POA: Insufficient documentation

## 2015-10-31 DIAGNOSIS — Z8711 Personal history of peptic ulcer disease: Secondary | ICD-10-CM | POA: Diagnosis not present

## 2015-10-31 DIAGNOSIS — E785 Hyperlipidemia, unspecified: Secondary | ICD-10-CM | POA: Insufficient documentation

## 2015-10-31 DIAGNOSIS — Z8673 Personal history of transient ischemic attack (TIA), and cerebral infarction without residual deficits: Secondary | ICD-10-CM | POA: Diagnosis not present

## 2015-10-31 DIAGNOSIS — R29898 Other symptoms and signs involving the musculoskeletal system: Secondary | ICD-10-CM

## 2015-10-31 DIAGNOSIS — Z888 Allergy status to other drugs, medicaments and biological substances status: Secondary | ICD-10-CM | POA: Insufficient documentation

## 2015-10-31 DIAGNOSIS — I130 Hypertensive heart and chronic kidney disease with heart failure and stage 1 through stage 4 chronic kidney disease, or unspecified chronic kidney disease: Secondary | ICD-10-CM | POA: Diagnosis not present

## 2015-10-31 DIAGNOSIS — Z95810 Presence of automatic (implantable) cardiac defibrillator: Secondary | ICD-10-CM | POA: Diagnosis not present

## 2015-10-31 DIAGNOSIS — N200 Calculus of kidney: Secondary | ICD-10-CM | POA: Insufficient documentation

## 2015-10-31 DIAGNOSIS — I6523 Occlusion and stenosis of bilateral carotid arteries: Secondary | ICD-10-CM | POA: Insufficient documentation

## 2015-10-31 DIAGNOSIS — Z7901 Long term (current) use of anticoagulants: Secondary | ICD-10-CM | POA: Insufficient documentation

## 2015-10-31 DIAGNOSIS — Z951 Presence of aortocoronary bypass graft: Secondary | ICD-10-CM | POA: Insufficient documentation

## 2015-10-31 DIAGNOSIS — M6281 Muscle weakness (generalized): Secondary | ICD-10-CM | POA: Diagnosis not present

## 2015-10-31 DIAGNOSIS — I5022 Chronic systolic (congestive) heart failure: Secondary | ICD-10-CM | POA: Insufficient documentation

## 2015-10-31 DIAGNOSIS — I480 Paroxysmal atrial fibrillation: Secondary | ICD-10-CM | POA: Insufficient documentation

## 2015-10-31 DIAGNOSIS — M720 Palmar fascial fibromatosis [Dupuytren]: Secondary | ICD-10-CM | POA: Insufficient documentation

## 2015-10-31 DIAGNOSIS — Z79899 Other long term (current) drug therapy: Secondary | ICD-10-CM | POA: Insufficient documentation

## 2015-10-31 LAB — DIFFERENTIAL
Basophils Absolute: 0.1 10*3/uL (ref 0–0.1)
Basophils Relative: 1 %
EOS PCT: 6 %
Eosinophils Absolute: 0.3 10*3/uL (ref 0–0.7)
LYMPHS ABS: 1.3 10*3/uL (ref 1.0–3.6)
LYMPHS PCT: 22 %
MONO ABS: 0.7 10*3/uL (ref 0.2–1.0)
Monocytes Relative: 12 %
NEUTROS PCT: 59 %
Neutro Abs: 3.6 10*3/uL (ref 1.4–6.5)

## 2015-10-31 LAB — COMPREHENSIVE METABOLIC PANEL
ALBUMIN: 4.2 g/dL (ref 3.5–5.0)
ALK PHOS: 104 U/L (ref 38–126)
ALT: 21 U/L (ref 17–63)
ANION GAP: 9 (ref 5–15)
AST: 29 U/L (ref 15–41)
BILIRUBIN TOTAL: 1.3 mg/dL — AB (ref 0.3–1.2)
BUN: 29 mg/dL — ABNORMAL HIGH (ref 6–20)
CALCIUM: 9.7 mg/dL (ref 8.9–10.3)
CO2: 23 mmol/L (ref 22–32)
Chloride: 109 mmol/L (ref 101–111)
Creatinine, Ser: 1.16 mg/dL (ref 0.61–1.24)
GFR calc Af Amer: >60 mL/min (ref >60)
GFR calc non Af Amer: 58 mL/min — ABNORMAL LOW (ref >60)
GLUCOSE: 131 mg/dL — AB (ref 65–99)
Potassium: 4.1 mmol/L (ref 3.5–5.1)
Sodium: 141 mmol/L (ref 135–145)
TOTAL PROTEIN: 7.3 g/dL (ref 6.5–8.1)

## 2015-10-31 LAB — CBC
HEMATOCRIT: 39.3 % — AB (ref 40.0–52.0)
HEMOGLOBIN: 13.3 g/dL (ref 13.0–18.0)
MCH: 31.6 pg (ref 26.0–34.0)
MCHC: 33.7 g/dL (ref 32.0–36.0)
MCV: 93.8 fL (ref 80.0–100.0)
Platelets: 131 10*3/uL — ABNORMAL LOW (ref 150–440)
RBC: 4.19 MIL/uL — AB (ref 4.40–5.90)
RDW: 14.8 % — ABNORMAL HIGH (ref 11.5–14.5)
WBC: 6 10*3/uL (ref 3.8–10.6)

## 2015-10-31 LAB — PROTIME-INR
INR: 1.39
Prothrombin Time: 17.2 seconds — ABNORMAL HIGH (ref 11.4–15.2)

## 2015-10-31 LAB — GLUCOSE, CAPILLARY: Glucose-Capillary: 136 mg/dL — ABNORMAL HIGH (ref 65–99)

## 2015-10-31 LAB — TROPONIN I: Troponin I: 0.03 ng/mL (ref <0.03)

## 2015-10-31 LAB — APTT: aPTT: 46 seconds — ABNORMAL HIGH (ref 24–36)

## 2015-10-31 MED ORDER — CARVEDILOL 12.5 MG PO TABS
12.5000 mg | ORAL_TABLET | Freq: Two times a day (BID) | ORAL | Status: DC
  Administered 2015-10-31 – 2015-11-01 (×2): 12.5 mg via ORAL
  Filled 2015-10-31 (×2): qty 1

## 2015-10-31 MED ORDER — SIMVASTATIN 20 MG PO TABS
20.0000 mg | ORAL_TABLET | Freq: Every day | ORAL | Status: DC
  Administered 2015-10-31: 19:00:00 20 mg via ORAL
  Filled 2015-10-31: qty 1

## 2015-10-31 MED ORDER — VITAMIN B-12 1000 MCG PO TABS
500.0000 ug | ORAL_TABLET | Freq: Every day | ORAL | Status: DC
  Administered 2015-11-01: 500 ug via ORAL
  Filled 2015-10-31: qty 1

## 2015-10-31 MED ORDER — OXYMETAZOLINE HCL 0.05 % NA SOLN
2.0000 | Freq: Two times a day (BID) | NASAL | Status: DC | PRN
  Filled 2015-10-31: qty 15

## 2015-10-31 MED ORDER — SENNOSIDES-DOCUSATE SODIUM 8.6-50 MG PO TABS: 1.0000 | ORAL_TABLET | Freq: Every evening | ORAL | Status: DC | PRN

## 2015-10-31 MED ORDER — ACETAMINOPHEN 650 MG RE SUPP: 650.0000 mg | RECTAL | Status: DC | PRN

## 2015-10-31 MED ORDER — APIXABAN 5 MG PO TABS
5.0000 mg | ORAL_TABLET | Freq: Two times a day (BID) | ORAL | Status: DC
  Administered 2015-10-31 – 2015-11-01 (×2): 5 mg via ORAL
  Filled 2015-10-31 (×3): qty 1

## 2015-10-31 MED ORDER — STROKE: EARLY STAGES OF RECOVERY BOOK
Freq: Once | Status: AC
  Administered 2015-10-31: 16:00:00

## 2015-10-31 MED ORDER — ALBUTEROL SULFATE (2.5 MG/3ML) 0.083% IN NEBU: 2.5000 mg | INHALATION_SOLUTION | Freq: Four times a day (QID) | RESPIRATORY_TRACT | Status: DC | PRN

## 2015-10-31 MED ORDER — PNEUMOCOCCAL VAC POLYVALENT 25 MCG/0.5ML IJ INJ
0.5000 mL | INJECTION | INTRAMUSCULAR | Status: AC
Start: 2015-11-01 — End: 2015-11-01
  Administered 2015-11-01: 09:00:00 0.5 mL via INTRAMUSCULAR
  Filled 2015-10-31: qty 0.5

## 2015-10-31 MED ORDER — ISOSORBIDE MONONITRATE ER 30 MG PO TB24
90.0000 mg | ORAL_TABLET | Freq: Every day | ORAL | Status: DC
  Administered 2015-11-01: 90 mg via ORAL
  Filled 2015-10-31: qty 3

## 2015-10-31 MED ORDER — ASPIRIN EC 81 MG PO TBEC
81.0000 mg | DELAYED_RELEASE_TABLET | Freq: Every day | ORAL | Status: DC
  Administered 2015-10-31 – 2015-11-01 (×2): 81 mg via ORAL
  Filled 2015-10-31 (×2): qty 1

## 2015-10-31 MED ORDER — PANTOPRAZOLE SODIUM 40 MG PO TBEC
40.0000 mg | DELAYED_RELEASE_TABLET | Freq: Every day | ORAL | Status: DC
  Administered 2015-10-31 – 2015-11-01 (×2): 40 mg via ORAL
  Filled 2015-10-31 (×2): qty 1

## 2015-10-31 MED ORDER — FUROSEMIDE 40 MG PO TABS
40.0000 mg | ORAL_TABLET | Freq: Every day | ORAL | Status: DC
  Administered 2015-11-01: 09:00:00 40 mg via ORAL
  Filled 2015-10-31 (×2): qty 1

## 2015-10-31 MED ORDER — ACETAMINOPHEN 325 MG PO TABS: 650.0000 mg | ORAL_TABLET | ORAL | Status: DC | PRN

## 2015-10-31 NOTE — Progress Notes (Signed)
Pt came in today after c/o being dizzy and rt sided numbness.  Stroke workup ct neg  Down now for carotid US. A/o nih 1. Denies pain  neuros intact

## 2015-10-31 NOTE — ED Notes (Signed)
Code  Stroke  Called  To 333 

## 2015-10-31 NOTE — Consult Note (Signed)
Referring Physician: [name]    Chief Complaint: Right sided numbness and dizziness  HPI: Thomas [name] is an 81 y.o. male who reports that he came with his wife to the doctor today and noted after she went into the appointment that he was dizzy.  Noted numbness on the right side as well.  Went home to eat but felt no better and presented for evaluation.  Patient with a history of PAF on Eliquis.  Initial NIHSS of 1.    Date last known well: Date: 10/31/2015 Time last known well: Time: 09:00 tPA Given: No: On Eliquis  Past Medical History:  Diagnosis Date  . Biventricular ICD (implantable cardioverter-defibrillator) in place 03/24/2005   Implantation of a Medtronic Adapta ADDRO1, serial number [id number]  . CHF (congestive heart failure) ([place])   . CKD (chronic kidney disease), stage III   . Coronary artery disease    a. s/p CABG 1986. b. Multiple PCIs/caths. c. 09/2013: s/p PTCA and BMS to SVG-OM.  [name]   . History of abdominal aortic aneurysm   . History of bleeding peptic ulcer 1980  . History of epididymitis 2013  . HTN (hypertension)   . Hydronephrosis with ureteropelvic junction obstruction   . Hydroureter on left 2009  . Hypertension   . Ischemic cardiomyopathy    a. Prior EF 30-35%, s/p BIV-ICD. b. 09/2013: EF 45-50%.  . Moderate tricuspid regurgitation   . PAF (paroxysmal atrial fibrillation) ([place])   . Prostate cancer ([place])   . Status post coronary artery bypass grafting 1986   LIMA to the LAD, SVG to OM, SVG to RCA  . Testicular swelling     Past Surgical History:  Procedure Laterality Date  . 2-D echocardiogram  11/20/2011   Ejection fraction 30-35% moderate concentric left ventricular hypertrophy. Left atrium is moderately dilated. Mild MR. Mild or  . BI-VENTRICULAR IMPLANTABLE CARDIOVERTER DEFIBRILLATOR N/A 12/16/2012   Procedure: BI-VENTRICULAR IMPLANTABLE CARDIOVERTER DEFIBRILLATOR  (CRT-D);  Surgeon: [name], MD;  Location: [facility] C[facility] LAB;  Service:  Cardiovascular;  Laterality: N/A;  . CARDIAC CATHETERIZATION  12/10/2011   SVG to OM widely patent.  LIMA to LAD patent  . CORONARY ARTERY BYPASS GRAFT  1986  . INSERT / REPLACE / REMOVE PACEMAKER    . LEFT HEART CATHETERIZATION WITH CORONARY/GRAFT ANGIOGRAM N/A 12/10/2011   Procedure: LEFT HEART CATHETERIZATION WITH Beatrix Fetters;  Surgeon: Sanda Klein, MD;  Location: [place] CATH LAB;  Service: Cardiovascular;  Laterality: N/A;  . LEFT HEART CATHETERIZATION WITH CORONARY/GRAFT ANGIOGRAM N/A 09/25/2013   Procedure: LEFT HEART CATHETERIZATION WITH Beatrix Fetters;  Surgeon: Blane Ohara, MD;  Location: [facility] CATH LAB;  Service: Cardiovascular;  Laterality: N/A;  . Persantine Myoview  05/06/2010   Post-rest ejection fraction 30%. No significant ischemia demonstrated. Compared to previous study there is no significant change.  . TRANSURETHRAL RESECTION OF PROSTATE     s/p    Family history: Mother with Hypertension, arthritis.  Father with arthritis and kidney disease  Social History:  reports that he quit smoking about 30 years ago. He has never used smokeless tobacco. He reports that he does not drink alcohol or use drugs.  Allergies:  Allergies  Allergen Reactions  . Phenazopyridine Nausea Only and Other (See Comments)    Other Reaction: GI UPSET  . Ramipril Other (See Comments)    Medications: I have reviewed the patient's current medications. Prior to Admission:  Prior to Admission medications   Medication Sig Start Date End Date Taking? Authorizing Provider  albuterol (PROVENTIL HFA;VENTOLIN HFA) 108 (90 Base) MCG/ACT inhaler Inhale 2 puffs into the lungs every 6 (six) hours as needed for wheezing or shortness of breath. 04/02/15  Yes Mihai Croitoru, MD  apixaban (ELIQUIS) 5 MG TABS tablet Take 1 tablet (5 mg total) by mouth 2 (two) times daily. 04/11/15  Yes Mihai Croitoru, MD  carvedilol (COREG) 12.5 MG tablet Take 1 tablet (12.5 mg total) by mouth 2 (two) times  daily with a meal. Please schedule appointment for refills. 10/04/15  Yes Mihai Croitoru, MD  furosemide (LASIX) 40 MG tablet Take 1 tablet (40 mg total) by mouth daily. 09/27/13  Yes Dayna N Dunn, PA-C  isosorbide mononitrate (IMDUR) 60 MG 24 hr tablet Take 1.5 tablets (90 mg total) by mouth daily. 05/06/15  Yes Mihai Croitoru, MD  losartan-hydrochlorothiazide (HYZAAR) 50-12.5 MG tablet Take 1 tablet by mouth daily.   Yes Historical Provider, MD  oxymetazoline (AFRIN) 0.05 % nasal spray Place 2 sprays into both nostrils 2 (two) times daily as needed for congestion. Use as needed for nose bleeds 08/24/14  Yes Earleen Newport, MD  simvastatin (ZOCOR) 20 MG tablet Take 20 mg by mouth daily at 6 PM.   Yes Historical Provider, MD  vitamin B-12 (CYANOCOBALAMIN) 500 MCG tablet Take 500 mcg by mouth daily. Reported on 08/19/2015   Yes Historical Provider, MD     ROS: History obtained from the patient  General ROS: negative for - chills, fatigue, fever, night sweats, weight gain or weight loss Psychological ROS: negative for - behavioral disorder, hallucinations, memory difficulties, mood swings or suicidal ideation Ophthalmic ROS: negative for - blurry vision, double vision, eye pain or loss of vision ENT ROS: deaf Allergy and Immunology ROS: negative for - hives or itchy/watery eyes Hematological and Lymphatic ROS: negative for - bleeding problems, bruising or swollen lymph nodes Endocrine ROS: negative for - galactorrhea, hair pattern changes, polydipsia/polyuria or temperature intolerance Respiratory ROS: negative for - cough, hemoptysis, shortness of breath or wheezing Cardiovascular ROS: negative for - chest pain, dyspnea on exertion, edema or irregular heartbeat Gastrointestinal ROS: negative for - abdominal pain, diarrhea, hematemesis, nausea/vomiting or stool incontinence Genito-Urinary ROS: negative for - dysuria, hematuria, incontinence or urinary frequency/urgency Musculoskeletal ROS:  negative for - joint swelling or muscular weakness Neurological ROS: as noted in HPI Dermatological ROS: negative for rash and skin lesion changes  Physical Examination: Temperature 97.6 F (36.4 C).  HEENT-  Normocephalic, no lesions, without obvious abnormality.  Normal external eye and conjunctiva.  Normal TM's bilaterally.  Normal auditory canals and external ears. Normal external nose, mucus membranes and septum.  Normal pharynx. Cardiovascular- S1, S2 normal, pulses palpable throughout   Lungs- chest clear, no wheezing, rales, normal symmetric air entry Abdomen- soft, non-tender; bowel sounds normal; no masses,  no organomegaly Extremities- no edema Lymph-no adenopathy palpable Musculoskeletal-no joint tenderness, deformity or swelling Skin-warm and dry, no hyperpigmentation, vitiligo, or suspicious lesions  Neurological Examination Mental Status: Alert, oriented, thought content appropriate.  Speech dysarthric.  Able to follow 3 step commands without difficulty. Cranial Nerves: II: Discs flat bilaterally; Visual fields grossly normal, pupils equal, round, reactive to light and accommodation III,IV, VI: ptosis not present, extra-ocular motions intact bilaterally V,VII: smile symmetric, facial light touch sensation decreased on the right VIII: hearing normal bilaterally IX,X: gag reflex present XI: bilateral shoulder shrug XII: midline tongue extension Motor: Right : Upper extremity   5/5    Left:     Upper extremity   5/5  Lower extremity  5/5     Lower extremity   5/5 Tone and bulk:normal tone throughout; no atrophy noted Sensory: Pinprick and light touch decreased on the right Deep Tendon Reflexes: 2+ and symmetric with absent AJ's bilaterally Plantars: Right: downgoing   Left: downgoing Cerebellar: Normal finger-to-nose and normal heel-to-shin testing bilaterally Gait: not tested due to safety concerns    Laboratory Studies:  Basic Metabolic Panel:  Recent  Labs Lab 10/31/15 1033  NA 141  K 4.1  CL 109  CO2 23  GLUCOSE 131*  BUN 29*  CREATININE 1.16  CALCIUM 9.7    Liver Function Tests:  Recent Labs Lab 10/31/15 1033  AST 29  ALT 21  ALKPHOS 104  BILITOT 1.3*  PROT 7.3  ALBUMIN 4.2   No results for input(s): LIPASE, AMYLASE in the last 168 hours. No results for input(s): AMMONIA in the last 168 hours.  CBC:  Recent Labs Lab 10/31/15 1033  WBC 6.0  NEUTROABS 3.6  HGB 13.3  HCT 39.3*  MCV 93.8  PLT 131*    Cardiac Enzymes:  Recent Labs Lab 10/31/15 1033  TROPONINI 0.03*    BNP: Invalid input(s): POCBNP  CBG:  Recent Labs Lab 10/31/15 1049  GLUCAP 136*    Microbiology: Results for orders placed or performed in visit on 09/18/15  Microscopic Examination     Status: Abnormal   Collection Time: 09/18/15  9:24 AM  Result Value Ref Range Status   WBC, UA 0-5 0 - 5 /hpf Final   RBC, UA 3-10 (A) 0 - 2 /hpf Final   Epithelial Cells (non renal) 0-10 0 - 10 /hpf Final   Bacteria, UA None seen None seen/Few Final    Coagulation Studies:  Recent Labs  10/31/15 1033  LABPROT 17.2*  INR 1.39    Urinalysis: No results for input(s): COLORURINE, LABSPEC, PHURINE, GLUCOSEU, HGBUR, BILIRUBINUR, KETONESUR, PROTEINUR, UROBILINOGEN, NITRITE, LEUKOCYTESUR in the last 168 hours.  Invalid input(s): APPERANCEUR  Lipid Panel:    Component Value Date/Time   CHOL 112 09/22/2013 0125   CHOL 139 11/20/2011 0624   TRIG 93 09/22/2013 0125   TRIG 108 11/20/2011 0624   HDL 32 (L) 09/22/2013 0125   HDL 26 (L) 11/20/2011 0624   CHOLHDL 3.5 09/22/2013 0125   VLDL 19 09/22/2013 0125   VLDL 22 11/20/2011 0624   LDLCALC 61 09/22/2013 0125   LDLCALC 91 11/20/2011 0624    HgbA1C: No results found for: HGBA1C  Urine Drug Screen:  No results found for: LABOPIA, COCAINSCRNUR, LABBENZ, AMPHETMU, THCU, LABBARB  Alcohol Level: No results for input(s): ETH in the last 168 hours.   Imaging: Ct Head Code Stroke W/o  Cm  Addendum Date: 10/31/2015   ADDENDUM REPORT: 10/31/2015 11:03 ADDENDUM: These results were called by telephone at the time of interpretation on 10/31/2015 at 11:00 am to Dr. Lenise Arena , who verbally acknowledged these results. Electronically Signed   By: Nelson Chimes M.D.   On: 10/31/2015 11:03   Result Date: 10/31/2015 CLINICAL DATA:  Code stroke. Sudden onset of dizziness and right arm weakness 40 minutes ago. EXAM: CT HEAD WITHOUT CONTRAST TECHNIQUE: Contiguous axial images were obtained from the base of the skull through the vertex without intravenous contrast. COMPARISON:  09/26/2014 FINDINGS: The brain shows generalized atrophy with chronic small-vessel ischemic changes of the white matter. There is an old lacunar infarction of the left thalamus. No sign of acute infarction, mass lesion, hemorrhage, hydrocephalus or extra-axial collection. There is atherosclerotic calcification of the major  vessels at the base of the brain. ASPECTS [facility] Stroke Program Early CT Score) - Ganglionic level infarction (caudate, lentiform nuclei, internal capsule, insula, M1-M3 cortex): 7 - Supraganglionic infarction (M4-M6 cortex): 3 Total score (0-10 with 10 being normal): 10 IMPRESSION: 1. No acute finding by CT. Atrophy and chronic small vessel ischemic changes. Old left thalamic lacunar infarction. 2. ASPECTS is 10 I am in the process of contacting the code stroke physician. Electronically Signed: By: Nelson Chimes M.D. On: 10/31/2015 10:52    Assessment: 81 y.o. male presenting with dizziness and right sided numbness.  History of PAF on Eliquis.  Not a candidate for tPA due to Eliquis compliance.  NIHSS of 1.  Suspect embolic infarct.  Further work up recommended.    Stroke Risk Factors - atrial fibrillation and hypertension  Plan: 1. HgbA1c, fasting lipid panel 2. Repeat head CT in 1-2 days 3. PT consult, OT consult, Speech consult 4. Echocardiogram 5. Carotid dopplers 6. Prophylactic  therapy-Antiplatelet med: Aspirin - dose 81mg  daily 7. NPO until RN stroke swallow screen 8. Telemetry monitoring 9. Frequent neuro checks  Case discussed with Dr. Leone Brand, MD Neurology [phone] 10/31/2015, 12:53 PM

## 2015-10-31 NOTE — Progress Notes (Signed)
Chaplain responded to complete order. Patient is deaf.Thomas [name] put in order requesting interpreter. Minerva Fester [phone]

## 2015-10-31 NOTE — ED Triage Notes (Signed)
Reports sudden onset of dizziness and right arm weakness onset today at 1010. No facial droop noted. Right grip weak, MAE

## 2015-10-31 NOTE — ED Provider Notes (Signed)
[facility] Emergency Department Provider Note        Time seen: ----------------------------------------- 10:43 AM on 10/31/2015 -----------------------------------------    I have reviewed the triage vital signs and the nursing notes.   HISTORY  Chief Complaint Weakness    HPI Thomas [name] is a 81 y.o. male who presents the ER fordizziness and right arm weakness that started around 10 this morning. Patient presented to the ER as a code stroke, patient states his main complaint was his right leg and right arm weakness. Patient felt like he couldn't move his right leg compared to the left and that his grip on his right hand wasn't as good as the left. Patient has not had a history of this before, did take his medicines including Eliquis this morning. He denies any facial symptoms or vision problems.   Past Medical History:  Diagnosis Date  . Biventricular ICD (implantable cardioverter-defibrillator) in place 03/24/2005   Implantation of a Medtronic Adapta ADDRO1, serial number [id number]  . CHF (congestive heart failure) ([place])   . CKD (chronic kidney disease), stage III   . Coronary artery disease    a. s/p CABG 1986. b. Multiple PCIs/caths. c. 09/2013: s/p PTCA and BMS to SVG-OM.  [name]   . History of abdominal aortic aneurysm   . History of bleeding peptic ulcer 1980  . History of epididymitis 2013  . HTN (hypertension)   . Hydronephrosis with ureteropelvic junction obstruction   . Hydroureter on left 2009  . Hypertension   . Ischemic cardiomyopathy    a. Prior EF 30-35%, s/p BIV-ICD. b. 09/2013: EF 45-50%.  . Moderate tricuspid regurgitation   . PAF (paroxysmal atrial fibrillation) ([place])   . Prostate cancer ([place])   . Status post coronary artery bypass grafting 1986   LIMA to the LAD, SVG to OM, SVG to RCA  . Testicular swelling     Patient Active Problem List   Diagnosis Date Noted  . Bulbous urethral stricture 09/18/2015  . Bilateral  deafness 08/19/2015  . Bilateral cataracts 08/19/2015  . Acid reflux 08/19/2015  . HLD (hyperlipidemia) 08/19/2015  . BP (high blood pressure) 08/19/2015  . Myocardial infarction ([place]) 08/19/2015  . Calculus of kidney 08/19/2015  . Artificial cardiac pacemaker 08/19/2015  . Gastroduodenal ulcer 08/19/2015  . Dupuytren's contracture of foot 08/19/2015  . Malignant neoplasm of prostate ([place]) 08/19/2015  . Microhematuria 08/19/2015  . Coronary artery disease 02/22/2015  . Status post coronary artery bypass grafting 02/22/2015  . Benign essential HTN 04/02/2014  . Hematochezia 02/20/2014  . Moderate tricuspid regurgitation 02/20/2014  . Mobitz type II atrioventricular block 12/12/2013  . Long term current use of anticoagulant therapy 10/18/2013  . Acute myocardial infarction, subendocardial infarction, initial episode of care ([place]) 09/21/2013  . Cardiomyopathy, ischemic: Ejection fraction 45% 11/11/2012  . CAD Status post coronary artery bypass grafting: 1995 11/11/2012  . Permanent atrial fibrillation ([place]) 11/11/2012  . Biventricular ICD Medtronic Viva single chamber October 2014 11/11/2012  . Chronic systolic heart failure ([place]) 11/11/2012    Past Surgical History:  Procedure Laterality Date  . 2-D echocardiogram  11/20/2011   Ejection fraction 30-35% moderate concentric left ventricular hypertrophy. Left atrium is moderately dilated. Mild MR. Mild or  . BI-VENTRICULAR IMPLANTABLE CARDIOVERTER DEFIBRILLATOR N/A 12/16/2012   Procedure: BI-VENTRICULAR IMPLANTABLE CARDIOVERTER DEFIBRILLATOR  (CRT-D);  Surgeon: [name], MD;  Location: [facility] CATH LAB;  Service: Cardiovascular;  Laterality: N/A;  . CARDIAC CATHETERIZATION  12/10/2011   SVG to OM  widely patent.  LIMA to LAD patent  . CORONARY ARTERY BYPASS GRAFT  1986  . INSERT / REPLACE / REMOVE PACEMAKER    . LEFT HEART CATHETERIZATION WITH CORONARY/GRAFT ANGIOGRAM N/A 12/10/2011   Procedure: LEFT HEART CATHETERIZATION WITH  Beatrix Fetters;  Surgeon: Sanda Klein, MD;  Location: [place] CATH LAB;  Service: Cardiovascular;  Laterality: N/A;  . LEFT HEART CATHETERIZATION WITH CORONARY/GRAFT ANGIOGRAM N/A 09/25/2013   Procedure: LEFT HEART CATHETERIZATION WITH Beatrix Fetters;  Surgeon: Blane Ohara, MD;  Location: [facility] CATH LAB;  Service: Cardiovascular;  Laterality: N/A;  . Persantine Myoview  05/06/2010   Post-rest ejection fraction 30%. No significant ischemia demonstrated. Compared to previous study there is no significant change.  . TRANSURETHRAL RESECTION OF PROSTATE     s/p    Allergies Phenazopyridine and Ramipril  Social History Social History  Substance Use Topics  . Smoking status: Former Smoker    Quit date: 03/15/1985  . Smokeless tobacco: Never Used  . Alcohol use No     Comment: occas.    Review of Systems Constitutional: Negative for fever. Cardiovascular: Negative for chest pain. Respiratory: Negative for shortness of breath. Gastrointestinal: Negative for abdominal pain, vomiting and diarrhea. Genitourinary: Negative for dysuria. Musculoskeletal: Negative for back pain. Skin: Negative for rash. Neurological: Positive for dizziness, right arm and right leg weakness  10-point ROS otherwise negative.  ____________________________________________   PHYSICAL EXAM:  VITAL SIGNS: ED Triage Vitals  Enc Vitals Group     BP      Pulse      Resp      Temp      Temp src      SpO2      Weight      Height      Head Circumference      Peak Flow      Pain Score      Pain Loc      Pain Edu?      Excl. in [place]?    Constitutional: Alert and oriented. Well appearing and in no distress. Eyes: Conjunctivae are normal. PERRL. Normal extraocular movements. ENT   Head: Normocephalic and atraumatic.   Nose: No congestion/rhinnorhea.   Mouth/Throat: Mucous membranes are moist.   Neck: No stridor. Cardiovascular: Normal rate, regular rhythm. No murmurs, rubs,  or gallops. Respiratory: Normal respiratory effort without tachypnea nor retractions. Breath sounds are clear and equal bilaterally. No wheezes/rales/rhonchi. Gastrointestinal: Soft and nontender. Normal bowel sounds Musculoskeletal: Nontender with normal range of motion in all extremities. No lower extremity tenderness nor edema. Neurologic:  Normal speech and language. No gross focal neurologic deficits are appreciated.  Skin:  Skin is warm, dry and intact. No rash noted. Psychiatric: Mood and affect are normal. Speech and behavior are normal.  ____________________________________________  EKG: Interpreted by me.Ventricular paced rhythm with a rate of 65 bpm, no acute changes are identified.  ____________________________________________  ED COURSE:  Pertinent labs & imaging results that were available during my care of the patient were reviewed by me and considered in my medical decision making (see chart for details). Clinical Course  Patient presents to ER with acute neurologic symptoms. He is brought in under code stroke criteria. He'll be evaluated by neurology.  Procedures ____________________________________________   LABS (pertinent positives/negatives)  Labs Reviewed  PROTIME-INR - Abnormal; Notable for the following:       Result Value   Prothrombin Time 17.2 (*)    All other components within normal limits  APTT - Abnormal; Notable  for the following:    aPTT 46 (*)    All other components within normal limits  CBC - Abnormal; Notable for the following:    RBC 4.19 (*)    HCT 39.3 (*)    RDW 14.8 (*)    Platelets 131 (*)    All other components within normal limits  GLUCOSE, CAPILLARY - Abnormal; Notable for the following:    Glucose-Capillary 136 (*)    All other components within normal limits  DIFFERENTIAL  COMPREHENSIVE METABOLIC PANEL  TROPONIN I  CBG MONITORING, ED    RADIOLOGY Images were viewed by me  CT head IMPRESSION: 1. No acute findings  within the abdomen or pelvis. 2. Small nonobstructing left renal calculus. 3. Bilateral renal atrophy, right greater than left. Multiple areas of cortical scarring are noted involving the right kidney. 4. Prostate gland enlargement. 5. Aortic atherosclerosis. The patient is status post stent graft repair of infrarenal abdominal aorta 6. Morphologic features a liver are suggestive of early cirrhosis.   Electronically Signed   By: Kerby Moors M.D. ____________________________________________  FINAL ASSESSMENT AND PLAN  CVA  Plan: Patient with labs and imaging as dictated above. Patient likely with acute left-sided CVA affecting the right side of his body, particular his right leg. His right arm appears to have returned to normal but his right leg remains weak. He is not a TPA candidate due to him being on Eliquis and having taking a dose this morning. His NIH stroke scale is 1. I will discuss with the hospitalist for admission. He has already been evaluated by neurology.   Earleen Newport, MD   Note: This dictation was prepared with Dragon dictation. Any transcriptional errors that result from this process are unintentional    Earleen Newport, MD 10/31/15 1118

## 2015-10-31 NOTE — ED Notes (Signed)
Pt arrived to room

## 2015-10-31 NOTE — H&P (Addendum)
[place] [facility] [place] NAME: Thomas [name]    MR#:  [id number]  DATE OF BIRTH:  [birth year]  DATE OF ADMISSION:  10/31/2015  PRIMARY CARE PHYSICIAN: [name], MD   REQUESTING/REFERRING PHYSICIAN: Dr. [name]  CHIEF COMPLAINT:   Dizziness and right-sided weakness HISTORY OF PRESENT ILLNESS:  Thomas [name]  is a 81 y.o. male with a known history of Coronary artery disease status post CABG, biventricular ICD, congestive heart failure, chronic kidney disease, hypertension, hyperlipidemia and ch deafness is brought into the ED for dizziness and right-sided weakness started at around 10 AM today. Code stroke was called and patient was complaining of right leg and right arm weakness to the ED physician. CT head is negative for acute findings. Denies any slurred speech or headache. According to the wife patient is under a lot of stress. Patient takes eliquis on daily basis and took his medications today a.m. prior to his ER ED arrival.  PAST MEDICAL HISTORY:   Past Medical History:  Diagnosis Date  . Biventricular ICD (implantable cardioverter-defibrillator) in place 03/24/2005   Implantation of a Medtronic Adapta ADDRO1, serial number [id number]  . CHF (congestive heart failure) ([place])   . CKD (chronic kidney disease), stage III   . Coronary artery disease    a. s/p CABG 1986. b. Multiple PCIs/caths. c. 09/2013: s/p PTCA and BMS to SVG-OM.  [name]   . History of abdominal aortic aneurysm   . History of bleeding peptic ulcer 1980  . History of epididymitis 2013  . HTN (hypertension)   . Hydronephrosis with ureteropelvic junction obstruction   . Hydroureter on left 2009  . Hypertension   . Ischemic cardiomyopathy    a. Prior EF 30-35%, s/p BIV-ICD. b. 09/2013: EF 45-50%.  . Moderate tricuspid regurgitation   . PAF (paroxysmal atrial fibrillation) ([place])   . Prostate cancer ([place])   . Status post coronary artery bypass grafting 1986    LIMA to the LAD, SVG to OM, SVG to RCA  . Testicular swelling     PAST SURGICAL HISTOIRY:   Past Surgical History:  Procedure Laterality Date  . 2-D echocardiogram  11/20/2011   Ejection fraction 30-35% moderate concentric left ventricular hypertrophy. Left atrium is moderately dilated. Mild MR. Mild or  . BI-VENTRICULAR IMPLANTABLE CARDIOVERTER DEFIBRILLATOR N/A 12/16/2012   Procedure: BI-VENTRICULAR IMPLANTABLE CARDIOVERTER DEFIBRILLATOR  (CRT-D);  Surgeon: [name], MD;  Location: [facility] CATH LAB;  Service: Cardiovascular;  Laterality: N/A;  . CARDIAC CATHETERIZATION  12/10/2011   SVG to OM widely patent.  LIMA to LAD patent  . CORONARY ARTERY BYPASS GRAFT  1986  . INSERT / REPLACE / REMOVE PACEMAKER    . LEFT HEART CATHETERIZATION WITH CORONARY/GRAFT ANGIOGRAM N/A 12/10/2011   Procedure: LEFT HEART CATHETERIZATION WITH Beatrix Fetters;  Surgeon: Sanda Klein, MD;  Location: [facility] CATH LAB;  Service: Cardiovascular;  Laterality: N/A;  . LEFT HEART CATHETERIZATION WITH CORONARY/GRAFT ANGIOGRAM N/A 09/25/2013   Procedure: LEFT HEART CATHETERIZATION WITH Beatrix Fetters;  Surgeon: Blane Ohara, MD;  Location: [facility] CATH LAB;  Service: Cardiovascular;  Laterality: N/A;  . Persantine Myoview  05/06/2010   Post-rest ejection fraction 30%. No significant ischemia demonstrated. Compared to previous study there is no significant change.  . TRANSURETHRAL RESECTION OF PROSTATE     s/p    SOCIAL HISTORY:   Social History  Substance Use Topics  . Smoking status: Former Smoker    Quit date: 03/15/1985  . Smokeless tobacco:  Never Used  . Alcohol use No     Comment: occas.    FAMILY HISTORY:  Hypertension and heart conditions in  his family  DRUG ALLERGIES:   Allergies  Allergen Reactions  . Phenazopyridine Nausea Only and Other (See Comments)    Other Reaction: GI UPSET  . Ramipril Other (See Comments)    REVIEW OF SYSTEMS:  CONSTITUTIONAL: No fever, fatigue or  weakness.  EYES: No blurred or double vision.  EARS, NOSE, AND THROAT: No tinnitus or ear pain.  RESPIRATORY: No cough, shortness of breath, wheezing or hemoptysis.  CARDIOVASCULAR: No chest pain, orthopnea, edema.  GASTROINTESTINAL: No nausea, vomiting, diarrhea or abdominal pain.  GENITOURINARY: No dysuria, hematuria.  ENDOCRINE: No polyuria, nocturia,  HEMATOLOGY: No anemia, easy bruising or bleeding SKIN: No rash or lesion. MUSCULOSKELETAL: No joint pain or arthritis.   NEUROLOGIC: No tingling, numbness, Reporting improving right-sided weakness.  PSYCHIATRY: No anxiety or depression.   MEDICATIONS AT HOME:   Prior to Admission medications   Medication Sig Start Date End Date Taking? Authorizing Provider  albuterol (PROVENTIL HFA;VENTOLIN HFA) 108 (90 Base) MCG/ACT inhaler Inhale 2 puffs into the lungs every 6 (six) hours as needed for wheezing or shortness of breath. 04/02/15  Yes Mihai Croitoru, MD  apixaban (ELIQUIS) 5 MG TABS tablet Take 1 tablet (5 mg total) by mouth 2 (two) times daily. 04/11/15  Yes Mihai Croitoru, MD  carvedilol (COREG) 12.5 MG tablet Take 1 tablet (12.5 mg total) by mouth 2 (two) times daily with a meal. Please schedule appointment for refills. 10/04/15  Yes Mihai Croitoru, MD  furosemide (LASIX) 40 MG tablet Take 1 tablet (40 mg total) by mouth daily. 09/27/13  Yes Dayna N Dunn, PA-C  isosorbide mononitrate (IMDUR) 60 MG 24 hr tablet Take 1.5 tablets (90 mg total) by mouth daily. 05/06/15  Yes Mihai Croitoru, MD  losartan-hydrochlorothiazide (HYZAAR) 50-12.5 MG tablet Take 1 tablet by mouth daily.   Yes Historical Provider, MD  oxymetazoline (AFRIN) 0.05 % nasal spray Place 2 sprays into both nostrils 2 (two) times daily as needed for congestion. Use as needed for nose bleeds 08/24/14  Yes Earleen Newport, MD  simvastatin (ZOCOR) 20 MG tablet Take 20 mg by mouth daily at 6 PM.   Yes Historical Provider, MD  vitamin B-12 (CYANOCOBALAMIN) 500 MCG tablet Take 500  mcg by mouth daily. Reported on 08/19/2015   Yes Historical Provider, MD      VITAL SIGNS:  Temperature 97.6 F (36.4 C).  PHYSICAL EXAMINATION:  GENERAL:  81 y.o.-year-old patient lying in the bed with no acute distress.  EYES: Pupils equal, round, reactive to light and accommodation. No scleral icterus. Extraocular muscles intact.  HEENT: Head atraumatic, normocephalic. Oropharynx and nasopharynx clear.  NECK:  Supple, no jugular venous distention. No thyroid enlargement, no tenderness.  LUNGS: Normal breath sounds bilaterally, no wheezing, rales,rhonchi or crepitation. No use of accessory muscles of respiration.  CARDIOVASCULAR: S1, S2 normal. No murmurs, rubs, or gallops.  ABDOMEN: Soft, nontender, nondistended. Bowel sounds present. No organomegaly or mass.  EXTREMITIES: No pedal edema, cyanosis, or clubbing.  NEUROLOGIC: Cranial nerves II through XII are intact. Muscle strength 5/5 in all extremitiesExcept right upper extremity 4 out of 5 Sensation intact. Gait not checked.  PSYCHIATRIC: The patient is alert and oriented x 3.  SKIN: No obvious rash, lesion, or ulcer.   LABORATORY PANEL:   CBC  Recent Labs Lab 10/31/15 1033  WBC 6.0  HGB 13.3  HCT 39.3*  PLT  131*   ------------------------------------------------------------------------------------------------------------------  Chemistries   Recent Labs Lab 10/31/15 1033  NA 141  K 4.1  CL 109  CO2 23  GLUCOSE 131*  BUN 29*  CREATININE 1.16  CALCIUM 9.7  AST 29  ALT 21  ALKPHOS 104  BILITOT 1.3*   ------------------------------------------------------------------------------------------------------------------  Cardiac Enzymes  Recent Labs Lab 10/31/15 1033  TROPONINI 0.03*   ------------------------------------------------------------------------------------------------------------------  RADIOLOGY:  Ct Head Code Stroke W/o Cm  Addendum Date: 10/31/2015   ADDENDUM REPORT: 10/31/2015 11:03  ADDENDUM: These results were called by telephone at the time of interpretation on 10/31/2015 at 11:00 am to Dr. Lenise Arena , who verbally acknowledged these results. Electronically Signed   By: Nelson Chimes M.D.   On: 10/31/2015 11:03   Result Date: 10/31/2015 CLINICAL DATA:  Code stroke. Sudden onset of dizziness and right arm weakness 40 minutes ago. EXAM: CT HEAD WITHOUT CONTRAST TECHNIQUE: Contiguous axial images were obtained from the base of the skull through the vertex without intravenous contrast. COMPARISON:  09/26/2014 FINDINGS: The brain shows generalized atrophy with chronic small-vessel ischemic changes of the white matter. There is an old lacunar infarction of the left thalamus. No sign of acute infarction, mass lesion, hemorrhage, hydrocephalus or extra-axial collection. There is atherosclerotic calcification of the major vessels at the base of the brain. ASPECTS [facility] Stroke Program Early CT Score) - Ganglionic level infarction (caudate, lentiform nuclei, internal capsule, insula, M1-M3 cortex): 7 - Supraganglionic infarction (M4-M6 cortex): 3 Total score (0-10 with 10 being normal): 10 IMPRESSION: 1. No acute finding by CT. Atrophy and chronic small vessel ischemic changes. Old left thalamic lacunar infarction. 2. ASPECTS is 10 I am in the process of contacting the code stroke physician. Electronically Signed: By: Nelson Chimes M.D. On: 10/31/2015 10:52    EKG:   Orders placed or performed during the hospital encounter of 10/31/15  . ED EKG  . ED EKG    IMPRESSION AND PLAN:   81 year old male came into the ED with a chief complaint of dizziness and right arm weakness started at around 10 AM. CT head is negative. As per ED physician Dr. [name] discussion with Dr. Doy Mince on call neurologist TPA was not considered as patient is already on eliquis  #Acute CVA with right-sided weakness Admit to MedSurg unit CT head is negative MRI of the brain cannot be ordered as patient  has biventricular ICD Will consider repeating another CT head if symptoms are getting worse in the next 24 hours Carotid Dopplers and 2-D echocardiogram is ordered per protocol Baby aspirin 81 mg once daily Continue eliquis. Check fasting lipid panel in a.m. in the meanwhile continue patient's Zocor 20 mg once daily basis Continue neurochecks PT and OT evaluation Patient has passed bedside swallow evaluation according to the ED nurse. Will start patient on heart healthy diet Neurology consult is placed to Dr. Doy Mince Check hemoglobin A1c in a.m.  #Essential hypertension Blood pressure is soft Will hold off Hyzaar Resume rest of the medications including Coreg, Imdur and Lasix  #Coronary artery disease status post CABG, status post multiple PCI /cardiac caths Continue aspirin, Coreg, statin and Imdur Currently patient is asymptomatic  #History of peptic ulcer disease Patient currently denies any abdominal pain Provide PPI  #History of congestive heart failure Continue Lasix and statin Monitor daily weights and intake and output. Patient currently is asymptomatic and not fluid overloaded  DVT prophylaxis continue home medication eliquis    All the records are reviewed and case discussed with ED  provider. Management plans discussed with the patient, wife and with the help of a sign language interpreter  ms Carlene and they are in agreement.  CODE STATUS: FC,wife and son are the healthcare power of attorneyT TO TAL TIME TAKING CARE OF THIS PATIENT: 45  minutes.   Note: This dictation was prepared with Dragon dictation along with smaller phrase technology. Any transcriptional errors that result from this process are unintentional.  Nicholes Mango M.D on 10/31/2015 at 12:25 PM  Between 7am to 6pm - Pager - [phone]  After 6pm go to [email or web address] - password [email or web address] [facility]  [facility]  Office  [phone]  CC: Primary care physician; [name], MD

## 2015-11-01 DIAGNOSIS — I10 Essential (primary) hypertension: Secondary | ICD-10-CM | POA: Diagnosis not present

## 2015-11-01 DIAGNOSIS — I69851 Hemiplegia and hemiparesis following other cerebrovascular disease affecting right dominant side: Secondary | ICD-10-CM | POA: Diagnosis not present

## 2015-11-01 DIAGNOSIS — G459 Transient cerebral ischemic attack, unspecified: Secondary | ICD-10-CM | POA: Diagnosis not present

## 2015-11-01 DIAGNOSIS — E785 Hyperlipidemia, unspecified: Secondary | ICD-10-CM | POA: Diagnosis not present

## 2015-11-01 DIAGNOSIS — I2510 Atherosclerotic heart disease of native coronary artery without angina pectoris: Secondary | ICD-10-CM | POA: Diagnosis not present

## 2015-11-01 LAB — LIPID PANEL
CHOL/HDL RATIO: 3 ratio
CHOLESTEROL: 92 mg/dL (ref 0–200)
HDL: 31 mg/dL — AB (ref >40)
LDL Cholesterol: 46 mg/dL (ref 0–99)
TRIGLYCERIDES: 76 mg/dL (ref <150)
VLDL: 15 mg/dL (ref 0–40)

## 2015-11-01 LAB — GLUCOSE, CAPILLARY: GLUCOSE-CAPILLARY: 115 mg/dL — AB (ref 65–99)

## 2015-11-01 LAB — HEMOGLOBIN A1C: Hgb A1c MFr Bld: 6.1 % — ABNORMAL HIGH (ref 4.0–6.0)

## 2015-11-01 MED ORDER — ASPIRIN 81 MG PO TBEC: 81.0000 mg | DELAYED_RELEASE_TABLET | Freq: Every day | ORAL | 1 refills | Status: DC

## 2015-11-01 NOTE — Care Management Obs Status (Signed)
[place] NOTIFICATION   Patient Details  Name: Thomas [name] MRN: [id number] Date of Birth: 1934-07-07   Medicare Observation Status Notification Given:  Yes    Shelbie Ammons, RN 11/01/2015, 9:18 AM

## 2015-11-01 NOTE — Discharge Summary (Signed)
[place] [facility] [place] NAME: Thomas Nothdurft    MR#:  [id number]  DATE OF BIRTH:  12/29/1934  DATE OF ADMISSION:  10/31/2015 ADMITTING PHYSICIAN: Nicholes Mango, MD  DATE OF DISCHARGE: 11/01/15  PRIMARY CARE PHYSICIAN: [name], MD    ADMISSION DIAGNOSIS:  Dizziness [R42] Right leg weakness [R29.898]  DISCHARGE DIAGNOSIS:  TIA  SECONDARY DIAGNOSIS:   Past Medical History:  Diagnosis Date  . Biventricular ICD (implantable cardioverter-defibrillator) in place 03/24/2005   Implantation of a Medtronic Adapta ADDRO1, serial number [id number]  . CHF (congestive heart failure) ([place])   . CKD (chronic kidney disease), stage III   . Coronary artery disease    a. s/p CABG 1986. b. Multiple PCIs/caths. c. 09/2013: s/p PTCA and BMS to SVG-OM.  [name]   . History of abdominal aortic aneurysm   . History of bleeding peptic ulcer 1980  . History of epididymitis 2013  . HTN (hypertension)   . Hydronephrosis with ureteropelvic junction obstruction   . Hydroureter on left 2009  . Hypertension   . Ischemic cardiomyopathy    a. Prior EF 30-35%, s/p BIV-ICD. b. 09/2013: EF 45-50%.  . Moderate tricuspid regurgitation   . PAF (paroxysmal atrial fibrillation) ([place])   . Prostate cancer ([place])   . Status post coronary artery bypass grafting 1986   LIMA to the LAD, SVG to OM, SVG to RCA  . Testicular swelling     HOSPITAL COURSE:  81 year old male came into the ED with a chief complaint of dizziness and right arm weakness started at around 10 AM. CT head is negative. As per ED physician Dr. [name] discussion with Dr. Doy Mince on call neurologist TPA was not considered as patient is already on eliquis  # right-sided weakness due to [facility] CT head is negative MRI of the brain cannot be ordered as patient has biventricular ICD Carotid Dopplers negative Baby aspirin 81 mg once daily Continue eliquis. PT and OT evaluation noted recommends  [facility] consult with Dr. Doy Mince  #Essential hypertension Cont  hyzaar,Coreg, Imdur and Lasix  #Coronary artery disease status post CABG, status post multiple PCI /cardiac cath Continue aspirin, Coreg, statin and Imdur Currently patient is asymptomatic  #History of peptic ulcer disease Patient currently denies any abdominal pain Provide PPI  #History of congestive heart failure Continue Lasix and statin  DVT prophylaxis continue home medication eliquis  overall stable D/c home D/w via interpreter since pt is deaf  CONSULTS OBTAINED:  Treatment Team:  Nicholes Mango, MD Alexis Goodell, MD  DRUG ALLERGIES:   Allergies  Allergen Reactions  . Phenazopyridine Nausea Only and Other (See Comments)    Other Reaction: GI UPSET  . Ramipril Other (See Comments)    DISCHARGE MEDICATIONS:   Current Discharge Medication List    START taking these medications   Details  aspirin EC 81 MG EC tablet Take 1 tablet (81 mg total) by mouth daily. Qty: 30 tablet, Refills: 1      CONTINUE these medications which have NOT CHANGED   Details  albuterol (PROVENTIL HFA;VENTOLIN HFA) 108 (90 Base) MCG/ACT inhaler Inhale 2 puffs into the lungs every 6 (six) hours as needed for wheezing or shortness of breath. Qty: 1 Inhaler, Refills: 0    apixaban (ELIQUIS) 5 MG TABS tablet Take 1 tablet (5 mg total) by mouth 2 (two) times daily. Qty: 42 tablet, Refills: 0    carvedilol (COREG) 12.5 MG tablet Take 1 tablet (12.5 mg  total) by mouth 2 (two) times daily with a meal. Please schedule appointment for refills. Qty: 60 tablet, Refills: 0    furosemide (LASIX) 40 MG tablet Take 1 tablet (40 mg total) by mouth daily.    isosorbide mononitrate (IMDUR) 60 MG 24 hr tablet Take 1.5 tablets (90 mg total) by mouth daily. Qty: 135 tablet, Refills: 2    losartan-hydrochlorothiazide (HYZAAR) 50-12.5 MG tablet Take 1 tablet by mouth daily.    oxymetazoline (AFRIN) 0.05 % nasal  spray Place 2 sprays into both nostrils 2 (two) times daily as needed for congestion. Use as needed for nose bleeds Qty: 15 mL, Refills: 2    simvastatin (ZOCOR) 20 MG tablet Take 20 mg by mouth daily at 6 PM.    vitamin B-12 (CYANOCOBALAMIN) 500 MCG tablet Take 500 mcg by mouth daily. Reported on 08/19/2015        If you experience worsening of your admission symptoms, develop shortness of breath, life threatening emergency, suicidal or homicidal thoughts you must seek medical attention immediately by calling 911 or calling your MD immediately  if symptoms less severe.  You Must read complete instructions/literature along with all the possible adverse reactions/side effects for all the Medicines you take and that have been prescribed to you. Take any new Medicines after you have completely understood and accept all the possible adverse reactions/side effects.   Please note  You were cared for by a hospitalist during your hospital stay. If you have any questions about your discharge medications or the care you received while you were in the hospital after you are discharged, you can call the unit and asked to speak with the hospitalist on call if the hospitalist that took care of you is not available. Once you are discharged, your primary care physician will handle any further medical issues. Please note that NO REFILLS for any discharge medications will be authorized once you are discharged, as it is imperative that you return to your primary care physician (or establish a relationship with a primary care physician if you do not have one) for your aftercare needs so that they can reassess your need for medications and monitor your lab values. Today   SUBJECTIVE   Via interpreter. Doing well No complaints. Numbness lasted only for about 2 hours VITAL SIGNS:  Blood pressure 135/67, pulse 66, temperature (!) 92.2 F (33.4 C), temperature source Oral, resp. rate 17, height 6' (1.829 m), weight  236 lb (107 kg), SpO2 95 %.  I/O:   Intake/Output Summary (Last 24 hours) at 11/01/15 1010 Last data filed at 11/01/15 0315  Gross per 24 hour  Intake              240 ml  Output                2 ml  Net              238 ml    PHYSICAL EXAMINATION:  GENERAL:  81 y.o.-year-old patient lying in the bed with no acute distress.  EYES: Pupils equal, round, reactive to light and accommodation. No scleral icterus. Extraocular muscles intact.  HEENT: Head atraumatic, normocephalic. Oropharynx and nasopharynx clear.  NECK:  Supple, no jugular venous distention. No thyroid enlargement, no tenderness.  LUNGS: Normal breath sounds bilaterally, no wheezing, rales,rhonchi or crepitation. No use of accessory muscles of respiration.  CARDIOVASCULAR: S1, S2 normal. No murmurs, rubs, or gallops.  ABDOMEN: Soft, non-tender, non-distended. Bowel sounds present. No organomegaly or mass.  EXTREMITIES: No pedal edema, cyanosis, or clubbing.  NEUROLOGIC: Cranial nerves II through XII are intact. Muscle strength 5/5 in all extremities. Sensation intact. Gait not checked.  PSYCHIATRIC: The patient is alert and oriented x 3.  SKIN: No obvious rash, lesion, or ulcer.   DATA REVIEW:   CBC   Recent Labs Lab 10/31/15 1033  WBC 6.0  HGB 13.3  HCT 39.3*  PLT 131*    Chemistries   Recent Labs Lab 10/31/15 1033  NA 141  K 4.1  CL 109  CO2 23  GLUCOSE 131*  BUN 29*  CREATININE 1.16  CALCIUM 9.7  AST 29  ALT 21  ALKPHOS 104  BILITOT 1.3*    Microbiology Results   No results found for this or any previous visit (from the past 240 hour(s)).  RADIOLOGY:  US Carotid Bilateral (at [facility] Only)  Result Date: 11/01/2015 CLINICAL DATA:  CVA. EXAM: BILATERAL CAROTID DUPLEX ULTRASOUND TECHNIQUE: Pearline Cables scale imaging, color Doppler and duplex ultrasound were performed of bilateral carotid and vertebral arteries in the neck. COMPARISON:  CT 10/31/2015. FINDINGS: Criteria: Quantification of carotid  stenosis is based on velocity parameters that correlate the residual internal carotid diameter with NASCET-based stenosis levels, using the diameter of the distal internal carotid lumen as the denominator for stenosis measurement. The following velocity measurements were obtained: RIGHT ICA:  60/19 cm/sec CCA:  [id number] cm/sec SYSTOLIC ICA/CCA RATIO:  0.9 DIASTOLIC ICA/CCA RATIO:  1.5 ECA:  86 cm/sec LEFT ICA:  41/6 cm/sec CCA:  [id number] cm/sec SYSTOLIC ICA/CCA RATIO:  1.0 DIASTOLIC ICA/CCA RATIO:  1.7 ECA:  92 cm/sec RIGHT CAROTID ARTERY: Mild right carotid bifurcation plaque. No flow limiting stenosis. RIGHT VERTEBRAL ARTERY:  Patent with antegrade flow. LEFT CAROTID ARTERY: Mild left carotid bifurcation plaque. No flow limiting stenosis. LEFT VERTEBRAL ARTERY:  Patent with antegrade flow. IMPRESSION: 1. Mild bilateral carotid bifurcation plaque. No flow limiting stenosis. Degree of stenosis less than 50% bilaterally. 2.  Vertebral arteries are patent antegrade flow. Electronically Signed   By: Marcello Moores  Register   On: 11/01/2015 06:46   Ct Head Code Stroke W/o Cm  Addendum Date: 10/31/2015   ADDENDUM REPORT: 10/31/2015 11:03 ADDENDUM: These results were called by telephone at the time of interpretation on 10/31/2015 at 11:00 am to Dr. Lenise Arena , who verbally acknowledged these results. Electronically Signed   By: Nelson Chimes M.D.   On: 10/31/2015 11:03   Result Date: 10/31/2015 CLINICAL DATA:  Code stroke. Sudden onset of dizziness and right arm weakness 40 minutes ago. EXAM: CT HEAD WITHOUT CONTRAST TECHNIQUE: Contiguous axial images were obtained from the base of the skull through the vertex without intravenous contrast. COMPARISON:  09/26/2014 FINDINGS: The brain shows generalized atrophy with chronic small-vessel ischemic changes of the white matter. There is an old lacunar infarction of the left thalamus. No sign of acute infarction, mass lesion, hemorrhage, hydrocephalus or extra-axial collection.  There is atherosclerotic calcification of the major vessels at the base of the brain. ASPECTS [facility] Stroke Program Early CT Score) - Ganglionic level infarction (caudate, lentiform nuclei, internal capsule, insula, M1-M3 cortex): 7 - Supraganglionic infarction (M4-M6 cortex): 3 Total score (0-10 with 10 being normal): 10 IMPRESSION: 1. No acute finding by CT. Atrophy and chronic small vessel ischemic changes. Old left thalamic lacunar infarction. 2. ASPECTS is 10 I am in the process of contacting the code stroke physician. Electronically Signed: By: Nelson Chimes M.D. On: 10/31/2015 10:52     Management plans discussed with  the patient, family and they are in agreement.  CODE STATUS:     Code Status Orders        Start     Ordered   10/31/15 1445  Full code  Continuous     10/31/15 1444    Code Status History    Date Active Date Inactive Code Status Order ID Comments User Context   09/25/2013 11:37 AM 09/26/2013  3:02 PM Full Code [id number]  Sherren Mocha, MD Inpatient   09/21/2013  2:44 PM 09/25/2013 11:37 AM Full Code [id number]  Sherren Mocha, MD Inpatient   12/16/2012  4:48 PM 12/17/2012  4:39 PM Full Code [id number]  [name], MD Inpatient      TOTAL TIME TAKING CARE OF THIS PATIENT: 40 minutes.    Ersel Wadleigh M.D on 11/01/2015 at 10:10 AM  Between 7am to 6pm - Pager - [phone] After 6pm go to [email or web address] - password [email or web address] [facility]  [facility]  Office  [phone]  CC: Primary care physician; [name], MD

## 2015-11-01 NOTE — Discharge Instructions (Signed)
HHPT °

## 2015-11-01 NOTE — Evaluation (Signed)
Occupational Therapy Evaluation Patient Details Name: Thomas [name] MRN: [id number] DOB: 1934/11/21 Today's Date: 11/01/2015    History of Present Illness Pt. is a 81 y.o. male who was admitted with right sided weakness. Pt. presents with a PMH including: Left Thalamic Infarct, CHF, CKD, deafness, AAA, and PAF.   Clinical Impression   Pt. Is a 81 y.o. Male who was admitted to [facility] with right sided weakness. Pt. Reports the weakness has resolved, and his ability to use his RUE for ADL tasks is back to baseline.  No further OT services are warranted at this time. OT services are discharged.    Follow Up Recommendations  No OT follow up    Equipment Recommendations       Recommendations for Other Services PT consult     Precautions / Restrictions Precautions Precautions: Fall Restrictions Weight Bearing Restrictions: No      Mobility Bed Mobility               General bed mobility comments: Pt. up in chair upon arrival.  Transfers Overall transfer level: Modified independent                Balance Overall balance assessment: Needs assistance Sitting-balance support: Feet supported Sitting balance-Leahy Scale: Good   Postural control: Posterior lean Standing balance support: Bilateral upper extremity supported Standing balance-Leahy Scale: Fair Standing balance comment: less than fair dynamic standing balance                            ADL Overall ADL's : Needs assistance/impaired Eating/Feeding: Set up;Independent   Grooming: Set up;Independent           Upper Body Dressing : Independent   Lower Body Dressing: Independent               Functional mobility during ADLs: Modified independent       Vision     Perception     Praxis      Pertinent Vitals/Pain Pain Assessment: No/denies pain Pain Score: 0-No pain     Hand Dominance Right   Extremity/Trunk Assessment Upper Extremity Assessment Upper Extremity  Assessment: Overall WFL for tasks assessed (Pt. reports being back to baseline.)      Communication Communication Communication:  (Interpreter present for sign language.)   Cognition Arousal/Alertness: Awake/alert Behavior During Therapy: WFL for tasks assessed/performed Overall Cognitive Status: Within Functional Limits for tasks assessed                     General Comments       Exercises      Shoulder Instructions      Home Living Family/patient expects to be discharged to:: Private residence Living Arrangements: Spouse/significant other Available Help at Discharge: Family;Available 24 hours/day Type of Home: House Home Access: Stairs to enter [employer] of Steps: 1 Entrance Stairs-Rails: None Home Layout: One level     Bathroom Shower/Tub: Corporate investment banker: Standard Bathroom Accessibility: Yes   Home Equipment: None          Prior Functioning/Environment Level of Independence: Independent        Comments: pt. waqs able to drive.    OT Diagnosis:     OT Problem List:     OT Treatment/Interventions:      OT Goals(Current goals can be found in the care plan section) Acute Rehab OT Goals Patient Stated Goal: To get better.  OT Frequency:  Barriers to D/C:            Co-evaluation   Reason for Co-Treatment: For patient/therapist safety;Other (comment) (Pt has interpreter and used co visit for safety, communicati) PT goals addressed during session: Mobility/safety with mobility;Balance;Proper use of DME;Strengthening/ROM        End of Session Equipment Utilized During Treatment: Gait belt  Activity Tolerance: Patient tolerated treatment well Patient left:  (Pt. left with PT services, and interpreter.)   Time: [id number] OT Time Calculation (min): 35 min Charges:  OT General Charges $OT Visit: 1 Procedure OT Evaluation $OT Eval Moderate Complexity: 1 Procedure G-Codes: OT G-codes **NOT FOR  INPATIENT CLASS** Functional Assessment Tool Used: Clinical judgement based on pt. current functional status. Functional Limitation: Self care Self Care Current Status [phone]): At least 1 percent but less than 20 percent impaired, limited or restricted Self Care Goal Status [id number]): At least 1 percent but less than 20 percent impaired, limited or restricted Self Care Discharge Status [phone]): At least 1 percent but less than 20 percent impaired, limited or restricted  Harrel Carina, MS, OTR/L 11/01/2015, 1:17 PM

## 2015-11-01 NOTE — Evaluation (Signed)
SLP completed screening, pt wfl and did not require full evaluation. Continue regular with thin diet.

## 2015-11-01 NOTE — Evaluation (Signed)
Physical Therapy Evaluation Patient Details Name: Thomas [name] MRN: [id number] DOB: [birth year] Today's Date: 11/01/2015   History of Present Illness  81 yo male with onset of stroke symptoms including R side numbness and weakness was admitted.  PMHx:  L thalamic lacunar infarct, CHF, CKD, DEAF, AAA, PAF  Clinical Impression  Pt is going to be followed by PT daily for rehab for balance and gait training, as well as Strengthening to RLE as needed.  Very good motivation and will need some signing translation with all treatment.  Translator card in room and number to contact translator Lenox Ahr and [employer] is [phone].    Follow Up Recommendations Home health PT;Supervision/Assistance - 24 hour    Equipment Recommendations  Rolling walker with 5" wheels    Recommendations for Other Services Rehab consult     Precautions / Restrictions Precautions Precautions: Fall (telemetry) Restrictions Weight Bearing Restrictions: No      Mobility  Bed Mobility               General bed mobility comments: up when PT arrived in chair  Transfers Overall transfer level: Modified independent Equipment used: Rolling walker (2 wheeled);1 person hand held assist             General transfer comment: pt was cued for hand placement for sitting via interpreter  Ambulation/Gait Ambulation/Gait assistance: Min guard;Min assist;+2 physical assistance;+2 safety/equipment Ambulation Distance (Feet): 300 Feet (without walker and 150 with walker) Assistive device: Rolling walker (2 wheeled);1 person hand held assist Gait Pattern/deviations: Step-through pattern;Staggering right;Drifts right/left;Wide base of support;Decreased stride length Gait velocity: reduced Gait velocity interpretation: Below normal speed for age/gender General Gait Details: listing to R side with effort to turn corners and maneuver in confined spaces  Stairs Stairs: Yes Stairs  assistance: Min assist Stair Management: One rail Left;Forwards;Step to pattern;Alternating pattern Number of Stairs: 3 General stair comments: pt has no rail at home but depends on the rail  Wheelchair Mobility    Modified Rankin (Stroke Patients Only) Modified Rankin (Stroke Patients Only) Pre-Morbid Rankin Score: Slight disability Modified Rankin: Moderately severe disability     Balance Overall balance assessment: Needs assistance Sitting-balance support: Feet supported Sitting balance-Leahy Scale: Good   Postural control: Posterior lean Standing balance support: Bilateral upper extremity supported Standing balance-Leahy Scale: Fair Standing balance comment: less than fair dynamic standing balance                             Pertinent Vitals/Pain Pain Assessment: No/denies pain    Home Living Family/patient expects to be discharged to:: Private residence Living Arrangements: Spouse/significant other Available Help at Discharge: Family;Available 24 hours/day Type of Home: House Home Access: Stairs to enter Entrance Stairs-Rails: None Entrance Stairs-Number of Steps: 1 Home Layout: One level Home Equipment: None      Prior Function Level of Independence: Independent         Comments: drove and was able to care for himself     Hand Dominance   Dominant Hand: Right    Extremity/Trunk Assessment   Upper Extremity Assessment: Overall WFL for tasks assessed           Lower Extremity Assessment: RLE deficits/detail RLE Deficits / Details: R knee and hip are 4- to 4 strength    Cervical / Trunk Assessment: Normal  Communication   Communication: Interpreter utilized;Deaf (pt is deaf but signs well and lip reads)  Cognition Arousal/Alertness: Awake/alert  Behavior During Therapy: WFL for tasks assessed/performed Overall Cognitive Status: Within Functional Limits for tasks assessed                      General Comments       Exercises        Assessment/Plan    PT Assessment Patient needs continued PT services  PT Diagnosis Difficulty walking   PT Problem List Decreased strength;Decreased range of motion;Decreased balance;Decreased activity tolerance;Decreased mobility;Decreased coordination;Decreased knowledge of use of DME;Decreased safety awareness;Decreased knowledge of precautions;Cardiopulmonary status limiting activity;Obesity  PT Treatment Interventions DME instruction;Gait training;Stair training;Functional mobility training;Therapeutic activities;Therapeutic exercise;Balance training;Neuromuscular re-education;Patient/family education   PT Goals (Current goals can be found in the Care Plan section) Acute Rehab PT Goals Patient Stated Goal: to get up and move PT Goal Formulation: With patient Time For Goal Achievement: 11/15/15 Potential to Achieve Goals: Good    Frequency 7X/week   Barriers to discharge Decreased caregiver support      Co-evaluation PT/OT/SLP Co-Evaluation/Treatment: Yes Reason for Co-Treatment: For patient/therapist safety;Other (comment) (Pt has interpreter and used co visit for safety, communicati) PT goals addressed during session: Mobility/safety with mobility;Balance;Proper use of DME;Strengthening/ROM         End of Session Equipment Utilized During Treatment: Gait belt Activity Tolerance: Patient tolerated treatment well;Patient limited by fatigue Patient left: in chair;with call bell/phone within reach;with nursing/sitter in room;Other (comment) Engineer, maintenance (IT)) Nurse Communication: Mobility status    Functional Assessment Tool Used: clinical judgment Functional Limitation: Mobility: Walking and moving around Mobility: Walking and Moving Around Current Status [phone]): At least 40 percent but less than 60 percent impaired, limited or restricted Mobility: Walking and Moving Around Goal Status [phone]): At least 1 percent but less than 20 percent impaired, limited or  restricted    Time: 0915-1003 PT Time Calculation (min) (ACUTE ONLY): 48 min   Charges:   PT Evaluation $PT Eval Moderate Complexity: 1 Procedure PT Treatments $Gait Training: 8-22 mins   PT G Codes:   PT G-Codes **NOT FOR INPATIENT CLASS** Functional Assessment Tool Used: clinical judgment Functional Limitation: Mobility: Walking and moving around Mobility: Walking and Moving Around Current Status [id number]): At least 40 percent but less than 60 percent impaired, limited or restricted Mobility: Walking and Moving Around Goal Status [phone]): At least 1 percent but less than 20 percent impaired, limited or restricted    Ramond Dial 11/01/2015, 11:46 AM    Mee Hives, PT MS Acute Rehab Dept. Number: [place]

## 2015-11-01 NOTE — Progress Notes (Signed)
Received MD order to discharge patient to home, reviewed homes meds, prescription and discharge instructions with patient  Through interpretor and patient verbalized understanding discharge to home with friend Barnie Del in car, and patient took his his new walker  Home

## 2015-11-01 NOTE — Care Management (Signed)
Admitted to [facility] with the diagnosis of CVA under observation status. Lives with wife, [name] x 41 years [phone]). Son is [name] [phone]). Last seen Dr. Kary Kos sometime last year. Good appetite. No falls. Takes care of all basic and instrumental activities of daily living himself, drives. Prescriptions are filled at [facility] in [place]. No home Health. No skilled facility. No home oxygen. Wife will transport. All information discussed through sign person Shelbie Ammons RN MSN [facility] [phone]

## 2015-11-01 NOTE — Care Management (Signed)
Physical therapy evaluation completed. Recommends home with home health, physical therapy, and rolling walker. Discussed agencies. [facility]. Discussed that an intrepreter would be requested on each visit. Floydene Flock, [facility] representative updated. Wife will transport.  Shelbie Ammons RN MSN [facility] Care Management [phone]

## 2015-11-04 ENCOUNTER — Other Ambulatory Visit: Payer: Self-pay | Admitting: Cardiovascular Disease

## 2015-11-04 NOTE — Telephone Encounter (Signed)
Rx request sent to pharmacy.  

## 2015-11-05 ENCOUNTER — Other Ambulatory Visit: Payer: Self-pay | Admitting: Cardiovascular Disease

## 2015-11-11 ENCOUNTER — Ambulatory Visit (INDEPENDENT_AMBULATORY_CARE_PROVIDER_SITE_OTHER): Payer: PPO | Admitting: Cardiovascular Disease

## 2015-11-11 ENCOUNTER — Encounter: Payer: Self-pay | Admitting: Cardiovascular Disease

## 2015-11-11 VITALS — BP 134/77 | HR 65 | Ht 72.0 in | Wt 230.4 lb

## 2015-11-11 DIAGNOSIS — I5022 Chronic systolic (congestive) heart failure: Secondary | ICD-10-CM

## 2015-11-11 DIAGNOSIS — I4821 Permanent atrial fibrillation: Secondary | ICD-10-CM

## 2015-11-11 DIAGNOSIS — I071 Rheumatic tricuspid insufficiency: Secondary | ICD-10-CM | POA: Diagnosis not present

## 2015-11-11 DIAGNOSIS — Z95810 Presence of automatic (implantable) cardiac defibrillator: Secondary | ICD-10-CM

## 2015-11-11 DIAGNOSIS — Z7901 Long term (current) use of anticoagulants: Secondary | ICD-10-CM

## 2015-11-11 DIAGNOSIS — E785 Hyperlipidemia, unspecified: Secondary | ICD-10-CM

## 2015-11-11 DIAGNOSIS — I25708 Atherosclerosis of coronary artery bypass graft(s), unspecified, with other forms of angina pectoris: Secondary | ICD-10-CM | POA: Diagnosis not present

## 2015-11-11 DIAGNOSIS — I10 Essential (primary) hypertension: Secondary | ICD-10-CM

## 2015-11-11 DIAGNOSIS — I482 Chronic atrial fibrillation: Secondary | ICD-10-CM

## 2015-11-11 DIAGNOSIS — I441 Atrioventricular block, second degree: Secondary | ICD-10-CM

## 2015-11-11 DIAGNOSIS — H9193 Unspecified hearing loss, bilateral: Secondary | ICD-10-CM

## 2015-11-11 MED ORDER — APIXABAN 5 MG PO TABS: 5.0000 mg | ORAL_TABLET | Freq: Two times a day (BID) | ORAL | 11 refills | Status: DC

## 2015-11-11 NOTE — Patient Instructions (Signed)
Dr Sallyanne Kuster recommends that you continue on your current medications as directed. Please refer to the Current Medication list given to you today.  Your physician has requested that you have an echocardiogram. Echocardiography is a painless test that uses sound waves to create images of your heart. It provides your doctor with information about the size and shape of your heart and how well your heart's chambers and valves are working. This procedure takes approximately one hour. There are no restrictions for this procedure. This will be performed at our [facility] office - [address].  Remote monitoring is used to monitor your Pacemaker of ICD from home. This monitoring reduces the number of office visits required to check your device to one time per year. It allows [place] to keep an eye on the functioning of your device to ensure it is working properly. You are scheduled for a device check from home on Monday, November 27th, 2017. You may send your transmission at any time that day. If you have a wireless device, the transmission will be sent automatically. After your physician reviews your transmission, you will receive a postcard with your next transmission date.  Dr Sallyanne Kuster recommends that you schedule a follow-up appointment in 6 months with a device check. You will receive a reminder letter in the mail two months in advance. If you don't receive a letter, please call our office to schedule the follow-up appointment.  If you need a refill on your cardiac medications before your next appointment, please call your pharmacy.   Your discharge diagnosis from your recent hospitalization was transient ischemic attack (TIA).

## 2015-11-11 NOTE — Progress Notes (Signed)
Cardiology Office Note    Date:  11/11/2015   ID:  Thomas [name], Thomas [name] 12/10/1934, MRN [id number]  PCP:  [name], MD  Cardiologist:  Cristopher Peru, MD (EP); Sanda Klein, MD   Chief Complaint  Patient presents with  . Follow-up    History of Present Illness:  Thomas [name] is a 81 y.o. male with chronic systolic heart failure related to ischemic cardiomyopathy, previous coronary bypass surgery and previous stent in SVG-OM for acute non-STEMI in 2015, long-term persistent atrial fibrillation, history of second degree Mobitz type II AV block, CRT-D Medtronic device implanted 2014.  As always, we have the benefit of the services of a sign language interpreter.  On August 17 he was briefly hospitalized for what was deemed to be a transient ischemic attack. He came in with complaints of unsteady gait, but his symptoms resolved within less than 24 hours. He was advised to restart 81 mg aspirin. He was compliant with his oral anticoagulant.  Other than that event, since his last appointment Thomas [name] has been feeling great. 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 He denies any problems with shortness of breath or chest discomfort while doing this. He has not had leg edema, palpitations, syncope, defibrillator discharges, other focal neurological complaints, orthopnea, PND, bleeding, etc.  Device interrogation today shows normal function. Estimated generates a longevity is 3.8 years. He has 99.2% biventricular pacing and have been no meaningful episodes of high ventricular rate. Lead parameters remain stable.  He had bypass surgery in 1995. Left ventricle systolic function deteriorated in 2014 without options for revascularization by cardiac catheterization and he underwent implantation of his CRT-D in October 2014. He had substantial clinical improvement and left ventricular EF also improved to 45-50%. In July 2015 he had an acute non-STEMI with placement of  a stent to the SVG to OM (bare-metal 3.512 mm MultiLink vision). Native coronary arteries were occluded, LIMA to LAD was patent with a 50-70% stenosis at the anastomosis and chronic total occlusion of SVG-RCA.  Past Medical History:  Diagnosis Date  . Biventricular ICD (implantable cardioverter-defibrillator) in place 03/24/2005   Implantation of a Medtronic Adapta ADDRO1, serial number [id number]  . CHF (congestive heart failure) ([place])   . CKD (chronic kidney disease), stage III   . Coronary artery disease    a. s/p CABG 1986. b. Multiple PCIs/caths. c. 09/2013: s/p PTCA and BMS to SVG-OM.  [name]   . History of abdominal aortic aneurysm   . History of bleeding peptic ulcer 1980  . History of epididymitis 2013  . HTN (hypertension)   . Hydronephrosis with ureteropelvic junction obstruction   . Hydroureter on left 2009  . Hypertension   . Ischemic cardiomyopathy    a. Prior EF 30-35%, s/p BIV-ICD. b. 09/2013: EF 45-50%.  . Moderate tricuspid regurgitation   . PAF (paroxysmal atrial fibrillation) ([place])   . Prostate cancer ([place])   . Status post coronary artery bypass grafting 1986   LIMA to the LAD, SVG to OM, SVG to RCA  . Testicular swelling     Past Surgical History:  Procedure Laterality Date  . 2-D echocardiogram  11/20/2011   Ejection fraction 30-35% moderate concentric left ventricular hypertrophy. Left atrium is moderately dilated. Mild MR. Mild or  . BI-VENTRICULAR IMPLANTABLE CARDIOVERTER DEFIBRILLATOR N/A 12/16/2012   Procedure: BI-VENTRICULAR IMPLANTABLE CARDIOVERTER DEFIBRILLATOR  (CRT-D);  Surgeon: [name], MD;  Location: [facility] CATH LAB;  Service: Cardiovascular;  Laterality:  N/A;  . CARDIAC CATHETERIZATION  12/10/2011   SVG to OM widely patent.  LIMA to LAD patent  . CORONARY ARTERY BYPASS GRAFT  1986  . INSERT / REPLACE / REMOVE PACEMAKER    . LEFT HEART CATHETERIZATION WITH CORONARY/GRAFT ANGIOGRAM N/A 12/10/2011   Procedure: LEFT HEART CATHETERIZATION WITH  Beatrix Fetters;  Surgeon: Sanda Klein, MD;  Location: [facility] CATH LAB;  Service: Cardiovascular;  Laterality: N/A;  . LEFT HEART CATHETERIZATION WITH CORONARY/GRAFT ANGIOGRAM N/A 09/25/2013   Procedure: LEFT HEART CATHETERIZATION WITH Beatrix Fetters;  Surgeon: Blane Ohara, MD;  Location: [facility] CATH LAB;  Service: Cardiovascular;  Laterality: N/A;  . Persantine Myoview  05/06/2010   Post-rest ejection fraction 30%. No significant ischemia demonstrated. Compared to previous study there is no significant change.  . TRANSURETHRAL RESECTION OF PROSTATE     s/p    Current Medications: Outpatient Medications Prior to Visit  Medication Sig Dispense Refill  . albuterol (PROVENTIL HFA;VENTOLIN HFA) 108 (90 Base) MCG/ACT inhaler Inhale 2 puffs into the lungs every 6 (six) hours as needed for wheezing or shortness of breath. 1 Inhaler 0  . aspirin EC 81 MG EC tablet Take 1 tablet (81 mg total) by mouth daily. 30 tablet 1  . carvedilol (COREG) 12.5 MG tablet TAKE 1 TABLET TWICE A DAY APPT NEEDED FOR REFILLS 60 tablet 0  . isosorbide mononitrate (IMDUR) 60 MG 24 hr tablet TAKE 1.5 TABLETS (90 MG TOTAL) BY MOUTH DAILY. 135 tablet 0  . losartan-hydrochlorothiazide (HYZAAR) 50-12.5 MG tablet Take 1 tablet by mouth daily.    [name] oxymetazoline (AFRIN) 0.05 % nasal spray Place 2 sprays into both nostrils 2 (two) times daily as needed for congestion. Use as needed for nose bleeds 15 mL 2  . simvastatin (ZOCOR) 20 MG tablet Take 20 mg by mouth daily at 6 PM.    . vitamin B-12 (CYANOCOBALAMIN) 500 MCG tablet Take 500 mcg by mouth daily. Reported on 08/19/2015    . apixaban (ELIQUIS) 5 MG TABS tablet Take 1 tablet (5 mg total) by mouth 2 (two) times daily. 42 tablet 0  . furosemide (LASIX) 40 MG tablet Take 1 tablet (40 mg total) by mouth daily.     No facility-administered medications prior to visit.      Allergies:   Phenazopyridine and Ramipril   Social History   Social History  . Marital  status: Married    Spouse name: N/A  . Number of children: N/A  . Years of education: N/A   Social History Main Topics  . Smoking status: Former Smoker    Quit date: 03/15/1985  . Smokeless tobacco: Never Used  . Alcohol use No     Comment: occas.  . Drug use: No  . Sexual activity: Not Currently   Other Topics Concern  . None   Social History Narrative  . None     Family History:  The patient's family history is not on file.   ROS:   Please see the history of present illness.    ROS All other systems reviewed and are negative.   PHYSICAL EXAM:   VS:  BP 134/77 (BP Location: Left Arm, Patient Position: Sitting, Cuff Size: Normal)   Pulse 65   Ht 6' (1.829 m)   Wt 230 lb 6.4 oz (104.5 kg)   SpO2 96%   BMI 31.25 kg/m    GEN: Well nourished, well developed, in no acute distress  HEENT: normal  Neck: no JVD, carotid bruits, or masses  Cardiac: Paradoxically split S2, RRR; 3/6 holosystolic murmur heard throughout the entire precordium, no diastolic murmurs, rubs, or gallops,no edema , healthy left subclavian defibrillator site Respiratory:  clear to auscultation bilaterally, normal work of breathing GI: soft, nontender, nondistended, + BS MS: no deformity or atrophy  Skin: warm and dry, no rash Neuro:  Alert and Oriented x 3, Strength and sensation are intact Psych: euthymic mood, full affect  Wt Readings from Last 3 Encounters:  11/11/15 230 lb 6.4 oz (104.5 kg)  10/31/15 236 lb (107 kg)  10/28/15 231 lb (104.8 kg)      Studies/Labs Reviewed:   EKG:  EKG is not ordered today.  The Intracardiac electrogram today demonstrates 100% ventricular paced rhythm with background atrial fibrillation  Recent Labs: 10/31/2015: ALT 21; BUN 29; Creatinine, Ser 1.16; Hemoglobin 13.3; Platelets 131; Potassium 4.1; Sodium 141   Lipid Panel    Component Value Date/Time   CHOL 92 11/01/2015 0331   CHOL 139 11/20/2011 0624   TRIG 76 11/01/2015 0331   TRIG 108 11/20/2011 0624     HDL 31 (L) 11/01/2015 0331   HDL 26 (L) 11/20/2011 0624   CHOLHDL 3.0 11/01/2015 0331   VLDL 15 11/01/2015 0331   VLDL 22 11/20/2011 0624   LDLCALC 46 11/01/2015 0331   LDLCALC 91 11/20/2011 0624     ASSESSMENT:    1. Chronic systolic heart failure ([place])   2. Coronary artery disease involving coronary bypass graft of native heart with other forms of angina pectoris (HCC)   3. Permanent atrial fibrillation ([place])   4. Long term current use of anticoagulant   5. Mobitz type II atrioventricular block   6. Biventricular ICD Medtronic Viva single chamber October 2014   7. HLD (hyperlipidemia)   8. Essential hypertension   9. Tricuspid regurgitation   10. Bilateral deafness      PLAN:  In order of problems listed above:  1. CHF: NYHA functional class I-II, euvolemic on a relatively low dose of loop diuretic. Stable weight. On appropriate angiotensin receptor blocker and beta blocker therapy. Moderate ischemic cardiomyopathy, most recent estimated LVEF 45%. 2. CAD status post CABG and PCI of SVG to OM. Patent LIMA to LAD. Occluded native arteries and occluded SVG to RCA. Currently free of angina on beta blocker and long-acting nitrates. 3. AFib: on Eliquis anticoagulation. CHADSVasc 7 if he indeed just had a TIA (age 81, TIA 2, HTN, CAD, CHF). Previous difficulty with warfarin anticoagulation. Aspirin restarted after presumed TIA. 4. No recent bleeding, but he has had GI bleeding requiring transfusion in the past. So had an emergency room visit about a year ago for severe epistaxis. Will stop aspirin after one month. 5. High-grade AV block: device programmed VVIR due to permanent atrial fibrillation. 6. CRT-D (Medtronic Viva quad XT)  With normal function, followed by Dr. [name]. 7. Hyperlipidemia on statin therapy. LDL less than 70. 8. HTN: good control. 9. TR: Murmur sounds louder than before, reevaluate by echo. No overt right heart failure findings. 10.  Congenital  deafness  Medication Adjustments/Labs and Tests Ordered: Current medicines are reviewed at length with the patient today.  Concerns regarding medicines are outlined above.  Medication changes, Labs and Tests ordered today are listed in the Patient Instructions below. Patient Instructions  Dr Sallyanne Kuster recommends that you continue on your current medications as directed. Please refer to the Current Medication list given to you today.  Your physician has requested that you have an echocardiogram. Echocardiography is a painless  test that uses sound waves to create images of your heart. It provides your doctor with information about the size and shape of your heart and how well your heart's chambers and valves are working. This procedure takes approximately one hour. There are no restrictions for this procedure. This will be performed at our [facility] office - [address], [address].  Remote monitoring is used to monitor your Pacemaker of ICD from home. This monitoring reduces the number of office visits required to check your device to one time per year. It allows [place] to keep an eye on the functioning of your device to ensure it is working properly. You are scheduled for a device check from home on Monday, November 27th, 2017. You may send your transmission at any time that day. If you have a wireless device, the transmission will be sent automatically. After your physician reviews your transmission, you will receive a postcard with your next transmission date.  Dr Sallyanne Kuster recommends that you schedule a follow-up appointment in 6 months with a device check. You will receive a reminder letter in the mail two months in advance. If you don't receive a letter, please call our office to schedule the follow-up appointment.  If you need a refill on your cardiac medications before your next appointment, please call your pharmacy.   Your discharge diagnosis from your recent hospitalization was transient  ischemic attack (TIA).    Signed, Sanda Klein, MD  11/11/2015 12:22 PM    [facility] [place], [place], [place]  [address] Phone: [phone]; Fax: [phone]

## 2015-11-12 LAB — CUP PACEART INCLINIC DEVICE CHECK
Battery Voltage: 2.97 V
Brady Statistic AP VS Percent: 0 %
Brady Statistic AS VP Percent: 99.21 %
Brady Statistic AS VS Percent: 0.79 %
HIGH POWER IMPEDANCE MEASURED VALUE: 67 Ohm
Implantable Lead Implant Date: 20000210
Implantable Lead Implant Date: 20141003
Implantable Lead Location: 753860
Implantable Lead Model: 4244
Implantable Lead Serial Number: 413633
Lead Channel Impedance Value: 323 Ohm
Lead Channel Impedance Value: 342 Ohm
Lead Channel Impedance Value: 437 Ohm
Lead Channel Impedance Value: 665 Ohm
Lead Channel Impedance Value: 665 Ohm
Lead Channel Impedance Value: 665 Ohm
Lead Channel Pacing Threshold Amplitude: 1.375 V
Lead Channel Pacing Threshold Pulse Width: 0.4 ms
Lead Channel Pacing Threshold Pulse Width: 0.6 ms
Lead Channel Sensing Intrinsic Amplitude: 0.875 mV
Lead Channel Setting Pacing Amplitude: 2 V
Lead Channel Setting Pacing Pulse Width: 0.4 ms
Lead Channel Setting Sensing Sensitivity: 0.3 mV
MDC IDC LEAD IMPLANT DT: 20000210
MDC IDC LEAD LOCATION: 753858
MDC IDC LEAD LOCATION: 753859
MDC IDC LEAD SERIAL: 272469
MDC IDC MSMT BATTERY REMAINING LONGEVITY: 46 mo
MDC IDC MSMT LEADCHNL LV IMPEDANCE VALUE: 380 Ohm
MDC IDC MSMT LEADCHNL LV IMPEDANCE VALUE: 399 Ohm
MDC IDC MSMT LEADCHNL LV IMPEDANCE VALUE: 437 Ohm
MDC IDC MSMT LEADCHNL LV IMPEDANCE VALUE: 456 Ohm
MDC IDC MSMT LEADCHNL LV IMPEDANCE VALUE: 665 Ohm
MDC IDC MSMT LEADCHNL LV IMPEDANCE VALUE: 760 Ohm
MDC IDC MSMT LEADCHNL RA IMPEDANCE VALUE: 399 Ohm
MDC IDC MSMT LEADCHNL RA SENSING INTR AMPL: 0.875 mV
MDC IDC MSMT LEADCHNL RV PACING THRESHOLD AMPLITUDE: 0.75 V
MDC IDC MSMT LEADCHNL RV SENSING INTR AMPL: 4.5 mV
MDC IDC MSMT LEADCHNL RV SENSING INTR AMPL: 4.5 mV
MDC IDC SESS DTM: 20170828134139
MDC IDC SET LEADCHNL LV PACING AMPLITUDE: 2.5 V
MDC IDC SET LEADCHNL LV PACING PULSEWIDTH: 0.6 ms
MDC IDC STAT BRADY AP VP PERCENT: 0 %
MDC IDC STAT BRADY RA PERCENT PACED: 0 %
MDC IDC STAT BRADY RV PERCENT PACED: 99.22 %

## 2015-11-14 DIAGNOSIS — G459 Transient cerebral ischemic attack, unspecified: Secondary | ICD-10-CM | POA: Diagnosis not present

## 2015-11-15 ENCOUNTER — Other Ambulatory Visit: Payer: Self-pay | Admitting: Cardiovascular Disease

## 2015-11-25 ENCOUNTER — Ambulatory Visit (HOSPITAL_COMMUNITY): Payer: PPO | Attending: Internal Medicine

## 2015-11-25 DIAGNOSIS — I255 Ischemic cardiomyopathy: Secondary | ICD-10-CM | POA: Insufficient documentation

## 2015-11-25 DIAGNOSIS — I071 Rheumatic tricuspid insufficiency: Secondary | ICD-10-CM | POA: Insufficient documentation

## 2015-11-25 DIAGNOSIS — I340 Nonrheumatic mitral (valve) insufficiency: Secondary | ICD-10-CM | POA: Insufficient documentation

## 2015-11-25 DIAGNOSIS — N189 Chronic kidney disease, unspecified: Secondary | ICD-10-CM | POA: Insufficient documentation

## 2015-11-25 DIAGNOSIS — I130 Hypertensive heart and chronic kidney disease with heart failure and stage 1 through stage 4 chronic kidney disease, or unspecified chronic kidney disease: Secondary | ICD-10-CM | POA: Diagnosis not present

## 2015-11-25 DIAGNOSIS — I351 Nonrheumatic aortic (valve) insufficiency: Secondary | ICD-10-CM | POA: Insufficient documentation

## 2015-11-25 DIAGNOSIS — I509 Heart failure, unspecified: Secondary | ICD-10-CM | POA: Diagnosis not present

## 2015-11-25 DIAGNOSIS — Z951 Presence of aortocoronary bypass graft: Secondary | ICD-10-CM | POA: Diagnosis not present

## 2015-11-25 DIAGNOSIS — I480 Paroxysmal atrial fibrillation: Secondary | ICD-10-CM | POA: Insufficient documentation

## 2015-12-02 ENCOUNTER — Telehealth: Payer: Self-pay | Admitting: Cardiovascular Disease

## 2015-12-02 NOTE — Telephone Encounter (Signed)
[name] is calling because Thomas [name] has been having some swelling in his legs and his heart rate is 45-50 and also wants the results of his doppler . Please call .

## 2015-12-02 NOTE — Telephone Encounter (Signed)
Returned call to patient-spoke with patient via interpreter.  Pt wanting to know if he needs to call us if his legs begin swelling.  Advised patient that he could call us at anytime if he has concerns or questions.  Advised to call and let us know if he has increased swelling, SOB, increased in weight (>3lbs in 24 hours).  Reports he does not have a scale, states he can get one.  Advised this would be a good idea if he is able to monitor weights daily.  Denies swelling or SOB at this time, he just wanted to clarify based on echo results and recommendations.  Pt verbalized understanding and will call with further questions or concerns.

## 2015-12-03 ENCOUNTER — Telehealth: Payer: Self-pay | Admitting: Cardiology

## 2015-12-03 ENCOUNTER — Ambulatory Visit (INDEPENDENT_AMBULATORY_CARE_PROVIDER_SITE_OTHER): Payer: PPO | Admitting: *Deleted

## 2015-12-03 DIAGNOSIS — I441 Atrioventricular block, second degree: Secondary | ICD-10-CM | POA: Diagnosis not present

## 2015-12-03 NOTE — Progress Notes (Signed)
Remote ICD transmission.   

## 2015-12-03 NOTE — Telephone Encounter (Signed)
Attempted to confirm remote transmission with pt. No answer and was unable to leave a message.   

## 2015-12-04 ENCOUNTER — Encounter: Payer: Self-pay | Admitting: Cardiology

## 2015-12-05 NOTE — Progress Notes (Signed)
This encounter was created in error - please disregard.

## 2015-12-06 ENCOUNTER — Other Ambulatory Visit: Payer: Self-pay | Admitting: Cardiovascular Disease

## 2015-12-16 DIAGNOSIS — I639 Cerebral infarction, unspecified: Secondary | ICD-10-CM | POA: Diagnosis not present

## 2015-12-16 DIAGNOSIS — I480 Paroxysmal atrial fibrillation: Secondary | ICD-10-CM | POA: Diagnosis not present

## 2015-12-16 DIAGNOSIS — R531 Weakness: Secondary | ICD-10-CM | POA: Diagnosis not present

## 2015-12-23 LAB — CUP PACEART REMOTE DEVICE CHECK
Battery Voltage: 2.97 V
Brady Statistic AP VP Percent: 0 %
Brady Statistic AP VS Percent: 0 %
Brady Statistic AS VP Percent: 100 %
Brady Statistic RA Percent Paced: 0 %
Brady Statistic RV Percent Paced: 99.47 %
Date Time Interrogation Session: 20170919184432
HighPow Impedance: 65 Ohm
Implantable Lead Implant Date: 20000210
Implantable Lead Location: 753859
Implantable Lead Model: 4244
Implantable Lead Serial Number: 272469
Lead Channel Impedance Value: 285 Ohm
Lead Channel Impedance Value: 342 Ohm
Lead Channel Impedance Value: 342 Ohm
Lead Channel Impedance Value: 380 Ohm
Lead Channel Impedance Value: 380 Ohm
Lead Channel Impedance Value: 608 Ohm
Lead Channel Impedance Value: 646 Ohm
Lead Channel Pacing Threshold Amplitude: 0.625 V
Lead Channel Pacing Threshold Pulse Width: 0.4 ms
Lead Channel Sensing Intrinsic Amplitude: 4.5 mV
Lead Channel Setting Pacing Amplitude: 2.5 V
Lead Channel Setting Pacing Pulse Width: 0.4 ms
Lead Channel Setting Pacing Pulse Width: 0.6 ms
Lead Channel Setting Sensing Sensitivity: 0.3 mV
MDC IDC LEAD IMPLANT DT: 20000210
MDC IDC LEAD IMPLANT DT: 20141003
MDC IDC LEAD LOCATION: 753858
MDC IDC LEAD LOCATION: 753860
MDC IDC LEAD SERIAL: 413633
MDC IDC MSMT BATTERY REMAINING LONGEVITY: 44 mo
MDC IDC MSMT LEADCHNL LV IMPEDANCE VALUE: 456 Ohm
MDC IDC MSMT LEADCHNL LV IMPEDANCE VALUE: 646 Ohm
MDC IDC MSMT LEADCHNL LV IMPEDANCE VALUE: 646 Ohm
MDC IDC MSMT LEADCHNL LV IMPEDANCE VALUE: 665 Ohm
MDC IDC MSMT LEADCHNL LV PACING THRESHOLD AMPLITUDE: 1.375 V
MDC IDC MSMT LEADCHNL LV PACING THRESHOLD PULSEWIDTH: 0.6 ms
MDC IDC MSMT LEADCHNL RA IMPEDANCE VALUE: 399 Ohm
MDC IDC MSMT LEADCHNL RA SENSING INTR AMPL: 0.875 mV
MDC IDC MSMT LEADCHNL RA SENSING INTR AMPL: 0.875 mV
MDC IDC MSMT LEADCHNL RV IMPEDANCE VALUE: 380 Ohm
MDC IDC MSMT LEADCHNL RV SENSING INTR AMPL: 4.5 mV
MDC IDC SET LEADCHNL RV PACING AMPLITUDE: 2 V
MDC IDC STAT BRADY AS VS PERCENT: 0 %

## 2016-02-04 ENCOUNTER — Telehealth: Payer: Self-pay | Admitting: Cardiovascular Disease

## 2016-02-04 DIAGNOSIS — M545 Low back pain: Secondary | ICD-10-CM | POA: Diagnosis not present

## 2016-02-04 DIAGNOSIS — R05 Cough: Secondary | ICD-10-CM | POA: Diagnosis not present

## 2016-02-04 NOTE — Telephone Encounter (Signed)
Thomas [name], It's possible he is describing edema of the leg, with a indentation related to pressure from sleeping in a certain way. Can you please ask him if he has been weighing himself, if his ankles are swollen? He has had heart failure before. Please remind him of a sodium restricted diet and ask him if he's taking diuretics. Thanks, [employer]

## 2016-02-04 NOTE — Telephone Encounter (Signed)
Called back. Went to [employer]. Left msg to call.

## 2016-02-04 NOTE — Telephone Encounter (Signed)
Pt woke up this morning at 4:00,he had an indention in his right leg. After he walked around a while it disappeared,really weird. Please call to advise.

## 2016-02-04 NOTE — Telephone Encounter (Signed)
I returned call and spoke w wife (DPR) with assistance of sign language interpreter line. She voices concern that patient's leg looked "bowed in" this AM, he was having a hard time walking on it last night. She's not sure if he slept on it wrong but woke up with it continuing to bother him this morning (difficulty walking). He denies pain. He denies swelling of leg, redness, tenderness, etc. He is not c/o shortness of breath or chest pain. I advised PCP evaluation - unsure how else to direct them, but I did also ask that she call back if she notes any other symptoms today.

## 2016-02-10 ENCOUNTER — Ambulatory Visit (INDEPENDENT_AMBULATORY_CARE_PROVIDER_SITE_OTHER): Payer: PPO | Admitting: *Deleted

## 2016-02-10 DIAGNOSIS — I255 Ischemic cardiomyopathy: Secondary | ICD-10-CM

## 2016-02-12 NOTE — Progress Notes (Signed)
Remote ICD transmission.   

## 2016-02-13 ENCOUNTER — Encounter: Payer: Self-pay | Admitting: Cardiology

## 2016-02-26 ENCOUNTER — Emergency Department: Payer: PPO

## 2016-02-26 ENCOUNTER — Emergency Department
Admission: EM | Admit: 2016-02-26 | Discharge: 2016-02-26 | Disposition: A | Discharge status: Home / Self Care | Payer: PPO | Attending: Emergency Medicine | Admitting: Emergency Medicine

## 2016-02-26 ENCOUNTER — Encounter: Payer: Self-pay | Admitting: Emergency Medicine

## 2016-02-26 DIAGNOSIS — Z87891 Personal history of nicotine dependence: Secondary | ICD-10-CM | POA: Insufficient documentation

## 2016-02-26 DIAGNOSIS — M7981 Nontraumatic hematoma of soft tissue: Secondary | ICD-10-CM | POA: Insufficient documentation

## 2016-02-26 DIAGNOSIS — Z8546 Personal history of malignant neoplasm of prostate: Secondary | ICD-10-CM | POA: Diagnosis not present

## 2016-02-26 DIAGNOSIS — I509 Heart failure, unspecified: Secondary | ICD-10-CM | POA: Insufficient documentation

## 2016-02-26 DIAGNOSIS — Z7982 Long term (current) use of aspirin: Secondary | ICD-10-CM | POA: Diagnosis not present

## 2016-02-26 DIAGNOSIS — N183 Chronic kidney disease, stage 3 (moderate): Secondary | ICD-10-CM | POA: Insufficient documentation

## 2016-02-26 DIAGNOSIS — M79661 Pain in right lower leg: Secondary | ICD-10-CM | POA: Diagnosis not present

## 2016-02-26 DIAGNOSIS — R2241 Localized swelling, mass and lump, right lower limb: Secondary | ICD-10-CM | POA: Diagnosis not present

## 2016-02-26 DIAGNOSIS — I2510 Atherosclerotic heart disease of native coronary artery without angina pectoris: Secondary | ICD-10-CM | POA: Diagnosis not present

## 2016-02-26 DIAGNOSIS — M7989 Other specified soft tissue disorders: Secondary | ICD-10-CM | POA: Diagnosis not present

## 2016-02-26 DIAGNOSIS — I130 Hypertensive heart and chronic kidney disease with heart failure and stage 1 through stage 4 chronic kidney disease, or unspecified chronic kidney disease: Secondary | ICD-10-CM | POA: Insufficient documentation

## 2016-02-26 DIAGNOSIS — S8011XA Contusion of right lower leg, initial encounter: Secondary | ICD-10-CM | POA: Diagnosis not present

## 2016-02-26 LAB — CBC WITH DIFFERENTIAL/PLATELET
Basophils Absolute: 0.1 10*3/uL (ref 0–0.1)
Basophils Relative: 1 %
EOS ABS: 0.4 10*3/uL (ref 0–0.7)
Eosinophils Relative: 6 %
HEMATOCRIT: 38 % — AB (ref 40.0–52.0)
HEMOGLOBIN: 12.9 g/dL — AB (ref 13.0–18.0)
LYMPHS ABS: 1.3 10*3/uL (ref 1.0–3.6)
Lymphocytes Relative: 20 %
MCH: 31.7 pg (ref 26.0–34.0)
MCHC: 34.1 g/dL (ref 32.0–36.0)
MCV: 93.2 fL (ref 80.0–100.0)
MONOS PCT: 12 %
Monocytes Absolute: 0.8 10*3/uL (ref 0.2–1.0)
NEUTROS PCT: 61 %
Neutro Abs: 3.7 10*3/uL (ref 1.4–6.5)
Platelets: 156 10*3/uL (ref 150–440)
RBC: 4.08 MIL/uL — ABNORMAL LOW (ref 4.40–5.90)
RDW: 14.8 % — ABNORMAL HIGH (ref 11.5–14.5)
WBC: 6.2 10*3/uL (ref 3.8–10.6)

## 2016-02-26 LAB — COMPREHENSIVE METABOLIC PANEL
ALBUMIN: 4.2 g/dL (ref 3.5–5.0)
ALT: 21 U/L (ref 17–63)
AST: 28 U/L (ref 15–41)
Alkaline Phosphatase: 116 U/L (ref 38–126)
Anion gap: 5 (ref 5–15)
BUN: 31 mg/dL — ABNORMAL HIGH (ref 6–20)
CHLORIDE: 108 mmol/L (ref 101–111)
CO2: 26 mmol/L (ref 22–32)
CREATININE: 1.25 mg/dL — AB (ref 0.61–1.24)
Calcium: 9.7 mg/dL (ref 8.9–10.3)
GFR calc non Af Amer: 53 mL/min — ABNORMAL LOW (ref >60)
GLUCOSE: 97 mg/dL (ref 65–99)
Potassium: 4.2 mmol/L (ref 3.5–5.1)
SODIUM: 139 mmol/L (ref 135–145)
Total Bilirubin: 1.5 mg/dL — ABNORMAL HIGH (ref 0.3–1.2)
Total Protein: 7.4 g/dL (ref 6.5–8.1)

## 2016-02-26 LAB — APTT: APTT: 53 s — AB (ref 24–36)

## 2016-02-26 LAB — PROTIME-INR
INR: 1.61
Prothrombin Time: 19.3 seconds — ABNORMAL HIGH (ref 11.4–15.2)

## 2016-02-26 NOTE — Discharge Instructions (Signed)
The sore area on the back of your leg appears to be a resolving bruise, which is not uncommon with Eliquis. I advise tylenol and cold compresses if it gets worsee in any way, return to the emergency department. See your doctor tomorrow.

## 2016-02-26 NOTE — ED Provider Notes (Signed)
[facility] Emergency Department Provider Note  ____________________________________________   I have reviewed the triage vital signs and the nursing notes.   HISTORY  Chief Complaint Leg Swelling    HPI Thomas [name] is a 81 y.o. male history per interpreter via sign language. Patient with chronic bilateral lower extremity swelling has a "knot" on the back of his right calf which is been there for 2-3 weeks. It's a little bit tender. Does not have any chest pain shortness of breath, he is on eliquis for A. fib. He has had no pain in the calf in general just in that one isolated spot which appears to be "bruised".      Past Medical History:  Diagnosis Date  . Biventricular ICD (implantable cardioverter-defibrillator) in place 03/24/2005   Implantation of a Medtronic Adapta ADDRO1, serial number [id number]  . CHF (congestive heart failure) ([place])   . CKD (chronic kidney disease), stage III   . Coronary artery disease    a. s/p CABG 1986. b. Multiple PCIs/caths. c. 09/2013: s/p PTCA and BMS to SVG-OM.  [name]   . History of abdominal aortic aneurysm   . History of bleeding peptic ulcer 1980  . History of epididymitis 2013  . HTN (hypertension)   . Hydronephrosis with ureteropelvic junction obstruction   . Hydroureter on left 2009  . Hypertension   . Ischemic cardiomyopathy    a. Prior EF 30-35%, s/p BIV-ICD. b. 09/2013: EF 45-50%.  . Moderate tricuspid regurgitation   . PAF (paroxysmal atrial fibrillation) ([place])   . Prostate cancer ([place])   . Status post coronary artery bypass grafting 1986   LIMA to the LAD, SVG to OM, SVG to RCA  . Testicular swelling     Patient Active Problem List   Diagnosis Date Noted  . CVA (cerebral infarction) 10/31/2015  . Bulbous urethral stricture 09/18/2015  . Bilateral deafness 08/19/2015  . Bilateral cataracts 08/19/2015  . Acid reflux 08/19/2015  . HLD (hyperlipidemia) 08/19/2015  . BP (high blood pressure)  08/19/2015  . Myocardial infarction 08/19/2015  . Calculus of kidney 08/19/2015  . Artificial cardiac pacemaker 08/19/2015  . Gastroduodenal ulcer 08/19/2015  . Dupuytren's contracture of foot 08/19/2015  . Malignant neoplasm of prostate ([place]) 08/19/2015  . Microhematuria 08/19/2015  . Coronary artery disease 02/22/2015  . Status post coronary artery bypass grafting 02/22/2015  . Benign essential HTN 04/02/2014  . Hematochezia 02/20/2014  . Moderate tricuspid regurgitation 02/20/2014  . Mobitz type II atrioventricular block 12/12/2013  . Long term current use of anticoagulant 10/18/2013  . Acute myocardial infarction, subendocardial infarction, initial episode of care ([place]) 09/21/2013  . Cardiomyopathy, ischemic: Ejection fraction 45% 11/11/2012  . CAD Status post coronary artery bypass grafting: 1995 11/11/2012  . Permanent atrial fibrillation ([place]) 11/11/2012  . Biventricular ICD Medtronic Viva single chamber October 2014 11/11/2012  . Chronic systolic heart failure ([place]) 11/11/2012    Past Surgical History:  Procedure Laterality Date  . 2-D echocardiogram  11/20/2011   Ejection fraction 30-35% moderate concentric left ventricular hypertrophy. Left atrium is moderately dilated. Mild MR. Mild or  . BI-VENTRICULAR IMPLANTABLE CARDIOVERTER DEFIBRILLATOR N/A 12/16/2012   Procedure: BI-VENTRICULAR IMPLANTABLE CARDIOVERTER DEFIBRILLATOR  (CRT-D);  Surgeon: [name], MD;  Location: [facility] CATH LAB;  Service: Cardiovascular;  Laterality: N/A;  . CARDIAC CATHETERIZATION  12/10/2011   SVG to OM widely patent.  LIMA to LAD patent  . CORONARY ARTERY BYPASS GRAFT  1986  . INSERT / REPLACE / REMOVE PACEMAKER    .  LEFT HEART CATHETERIZATION WITH CORONARY/GRAFT ANGIOGRAM N/A 12/10/2011   Procedure: LEFT HEART CATHETERIZATION WITH Beatrix Fetters;  Surgeon: Sanda Klein, MD;  Location: [facility] CATH LAB;  Service: Cardiovascular;  Laterality: N/A;  . LEFT HEART CATHETERIZATION WITH  CORONARY/GRAFT ANGIOGRAM N/A 09/25/2013   Procedure: LEFT HEART CATHETERIZATION WITH Beatrix Fetters;  Surgeon: Blane Ohara, MD;  Location: [facility] CATH LAB;  Service: Cardiovascular;  Laterality: N/A;  . Persantine Myoview  05/06/2010   Post-rest ejection fraction 30%. No significant ischemia demonstrated. Compared to previous study there is no significant change.  . TRANSURETHRAL RESECTION OF PROSTATE     s/p    Prior to Admission medications   Medication Sig Start Date End Date Taking? Authorizing Provider  albuterol (PROVENTIL HFA;VENTOLIN HFA) 108 (90 Base) MCG/ACT inhaler Inhale 2 puffs into the lungs every 6 (six) hours as needed for wheezing or shortness of breath. 04/02/15  Yes Mihai Croitoru, MD  aspirin EC 81 MG EC tablet Take 1 tablet (81 mg total) by mouth daily. 11/01/15  Yes Fritzi Mandes, MD  carvedilol (COREG) 12.5 MG tablet TAKE 1 TABLET TWICE A DAY APPT NEEDED FOR REFILLS 12/06/15  Yes Mihai Croitoru, MD  ELIQUIS 5 MG TABS tablet TAKE 1 TABLET (5 MG TOTAL) BY MOUTH 2 (TWO) TIMES DAILY. 11/15/15  Yes Mihai Croitoru, MD  furosemide (LASIX) 40 MG tablet Take 1 tablet (40 mg total) by mouth daily. 09/27/13  Yes Dayna N Dunn, PA-C  isosorbide mononitrate (IMDUR) 60 MG 24 hr tablet TAKE 1.5 TABLETS (90 MG TOTAL) BY MOUTH DAILY. 11/06/15  Yes Mihai Croitoru, MD  losartan-hydrochlorothiazide (HYZAAR) 50-12.5 MG tablet Take 1 tablet by mouth daily.   Yes Historical Provider, MD  oxymetazoline (AFRIN) 0.05 % nasal spray Place 2 sprays into both nostrils 2 (two) times daily as needed for congestion. Use as needed for nose bleeds 08/24/14  Yes Earleen Newport, MD  simvastatin (ZOCOR) 20 MG tablet Take 20 mg by mouth daily at 6 PM.   Yes Historical Provider, MD  vitamin B-12 (CYANOCOBALAMIN) 500 MCG tablet Take 500 mcg by mouth daily. Reported on 08/19/2015   Yes Historical Provider, MD  tamsulosin (FLOMAX) 0.4 MG CAPS capsule Take 0.4 mg by mouth daily. 10/24/15   Historical Provider, MD     Allergies Phenazopyridine and Ramipril  History reviewed. No pertinent family history.  Social History Social History  Substance Use Topics  . Smoking status: Former Smoker    Quit date: 03/15/1985  . Smokeless tobacco: Never Used  . Alcohol use No     Comment: occas.    Review of Systems Constitutional: No fever/chills Eyes: No visual changes. ENT: No sore throat. No stiff neck no neck pain Cardiovascular: Denies chest pain. Respiratory: Denies shortness of breath. Gastrointestinal:   no vomiting.  No diarrhea.  No constipation. Genitourinary: Negative for dysuria. Musculoskeletal: Negative lower extremity swelling over baseline Skin: Negative for rash. Neurological: Negative for severe headaches, focal weakness or numbness. 10-point ROS otherwise negative.  ____________________________________________   PHYSICAL EXAM:  VITAL SIGNS: ED Triage Vitals  Enc Vitals Group     BP 02/26/16 0931 113/60     Pulse Rate 02/26/16 0931 65     Resp 02/26/16 0931 20     Temp 02/26/16 0931 97.4 F (36.3 C)     Temp Source 02/26/16 0931 Oral     SpO2 02/26/16 0931 95 %     Weight 02/26/16 0932 232 lb (105.2 kg)     Height 02/26/16 0932 6\' 2"  (  1.88 m)     Head Circumference --      Peak Flow --      Pain Score 02/26/16 0959 6     Pain Loc --      Pain Edu? --      Excl. in [place]? --     Constitutional: Alert and oriented. Well appearing and in no acute distress. Eyes: Conjunctivae are normal. PERRL. EOMI. Head: Atraumatic. Nose: No congestion/rhinnorhea. Mouth/Throat: Mucous membranes are moist.  Oropharynx non-erythematous. Neck: No stridor.   Nontender with no meningismus Cardiovascular: Normal rate, regular rhythm. Grossly normal heart sounds.  Good peripheral circulation. Respiratory: Normal respiratory effort.  No retractions. Lungs CTAB. Abdominal: Soft and nontender. No distention. No guarding no rebound Back:  There is no focal tenderness or step off.  there is  no midline tenderness there are no lesions noted. there is no CVA tenderness Musculoskeletal:There is chronic appearing mild bilateral pitting edema which is symmetric, there is chronic anterior skin changes consistent with chronic edema. There is what appears to be resolving bruise to the posterior right calf., It is mildly tender. His partially 2.5 x 2.57 m around. It is not fluctuant is not hot to touch it is not markedly red. Does have similar appearance other bruising noted on his lower extremities. no upper extremity tenderness. No joint effusions, no DVT signs strong distal pulses no edema Neurologic:  Normal speech and language. No gross focal neurologic deficits are appreciated.  Skin:  Skin is warm, dry and intact. No rash noted. Psychiatric: Mood and affect are normal. Speech and behavior are normal.  ____________________________________________   LABS (all labs ordered are listed, but only abnormal results are displayed)  Labs Reviewed  PROTIME-INR - Abnormal; Notable for the following:       Result Value   Prothrombin Time 19.3 (*)    All other components within normal limits  APTT - Abnormal; Notable for the following:    aPTT 53 (*)    All other components within normal limits  COMPREHENSIVE METABOLIC PANEL - Abnormal; Notable for the following:    BUN 31 (*)    Creatinine, Ser 1.25 (*)    Total Bilirubin 1.5 (*)    GFR calc non Af Amer 53 (*)    All other components within normal limits  CBC WITH DIFFERENTIAL/PLATELET - Abnormal; Notable for the following:    RBC 4.08 (*)    Hemoglobin 12.9 (*)    HCT 38.0 (*)    RDW 14.8 (*)    All other components within normal limits   ____________________________________________  EKG  I personally interpreted any EKGs ordered by me or triage  ____________________________________________  RADIOLOGY  I reviewed any imaging ordered by me or triage that were performed during my shift and, if possible, patient and/or family  made aware of any abnormal findings. ____________________________________________   PROCEDURES  Procedure(s) performed: None  Procedures  Critical Care performed: None  ____________________________________________   INITIAL IMPRESSION / ASSESSMENT AND PLAN / ED COURSE  Pertinent labs & imaging results that were available during my care of the patient were reviewed by me and considered in my medical decision making (see chart for details).  Patient with what appears to me to be a contusion to the right lower leg 2 weeks. Appears to be resolving. No evidence of abscess or superinfection. Ultrasound shows no evidence of DVT and I do not think this is a DVT. Patient has no symptoms of PE, he is already anticoagulated, I  have encouraged him to use cold compresses, I suspect this will pass. He understands if he gets worse or he has other new or worrisome symptoms he is to return to the emergency department. Caution given and understood  Clinical Course    ____________________________________________   FINAL CLINICAL IMPRESSION(S) / ED DIAGNOSES  Final diagnoses:  None      This chart was dictated using voice recognition software.  Despite best efforts to proofread,  errors can occur which can change meaning.      Schuyler Amor, MD 02/26/16 [phone]

## 2016-02-26 NOTE — ED Triage Notes (Signed)
Patient to ED from [facility] with right leg swelling X 2 weeks.  Patient and wife are deaf.  Interpreter has been requested.  Right lower leg edematous, ropey, hard warm area noted.

## 2016-02-26 NOTE — ED Notes (Addendum)
MD McShane at bedside. Interpreter present and triage completed

## 2016-02-26 NOTE — ED Triage Notes (Signed)
Pt arrived by POV with c/o of right leg pain.

## 2016-02-28 LAB — CUP PACEART REMOTE DEVICE CHECK
Battery Remaining Longevity: 42 mo
Battery Voltage: 2.96 V
Brady Statistic AP VP Percent: 0 %
Brady Statistic AP VS Percent: 0 %
Brady Statistic RA Percent Paced: 0 %
Brady Statistic RV Percent Paced: 99.17 %
HighPow Impedance: 72 Ohm
Implantable Lead Implant Date: 20000210
Implantable Lead Implant Date: 20141003
Implantable Lead Location: 753859
Implantable Lead Location: 753860
Implantable Lead Model: 4244
Implantable Lead Model: 4285
Implantable Lead Model: 4298
Implantable Pulse Generator Implant Date: 20141003
Lead Channel Impedance Value: 285 Ohm
Lead Channel Impedance Value: 342 Ohm
Lead Channel Impedance Value: 380 Ohm
Lead Channel Impedance Value: 380 Ohm
Lead Channel Impedance Value: 399 Ohm
Lead Channel Impedance Value: 456 Ohm
Lead Channel Impedance Value: 608 Ohm
Lead Channel Impedance Value: 608 Ohm
Lead Channel Impedance Value: 703 Ohm
Lead Channel Pacing Threshold Amplitude: 0.75 V
Lead Channel Sensing Intrinsic Amplitude: 0.875 mV
Lead Channel Sensing Intrinsic Amplitude: 4.5 mV
Lead Channel Sensing Intrinsic Amplitude: 4.5 mV
Lead Channel Setting Pacing Amplitude: 2 V
Lead Channel Setting Pacing Pulse Width: 0.4 ms
Lead Channel Setting Pacing Pulse Width: 0.6 ms
MDC IDC LEAD IMPLANT DT: 20000210
MDC IDC LEAD LOCATION: 753858
MDC IDC LEAD SERIAL: 272469
MDC IDC LEAD SERIAL: 413633
MDC IDC MSMT LEADCHNL LV IMPEDANCE VALUE: 342 Ohm
MDC IDC MSMT LEADCHNL LV IMPEDANCE VALUE: 646 Ohm
MDC IDC MSMT LEADCHNL LV IMPEDANCE VALUE: 646 Ohm
MDC IDC MSMT LEADCHNL LV PACING THRESHOLD AMPLITUDE: 1.25 V
MDC IDC MSMT LEADCHNL LV PACING THRESHOLD PULSEWIDTH: 0.6 ms
MDC IDC MSMT LEADCHNL RA IMPEDANCE VALUE: 399 Ohm
MDC IDC MSMT LEADCHNL RA SENSING INTR AMPL: 0.875 mV
MDC IDC MSMT LEADCHNL RV PACING THRESHOLD PULSEWIDTH: 0.4 ms
MDC IDC SESS DTM: 20171127051703
MDC IDC SET LEADCHNL LV PACING AMPLITUDE: 2.25 V
MDC IDC SET LEADCHNL RV SENSING SENSITIVITY: 0.3 mV
MDC IDC STAT BRADY AS VP PERCENT: 99.27 %
MDC IDC STAT BRADY AS VS PERCENT: 0.73 %

## 2016-02-28 NOTE — Telephone Encounter (Signed)
Patient calling the office for samples of medication:   1.  What medication and dosage are you requesting samples for? eliquis  2.  Are you currently out of this medication?   yes   Per refills at [address] pt can call back on Tuesday to check and see because they are currently out/ not sure about NL  Please call pt or wife

## 2016-02-28 NOTE — Telephone Encounter (Signed)
have samples for pt.

## 2016-03-03 ENCOUNTER — Ambulatory Visit (INDEPENDENT_AMBULATORY_CARE_PROVIDER_SITE_OTHER): Payer: PPO | Admitting: *Deleted

## 2016-03-03 DIAGNOSIS — I255 Ischemic cardiomyopathy: Secondary | ICD-10-CM

## 2016-03-03 NOTE — Progress Notes (Signed)
Remote ICD transmission.   

## 2016-03-04 ENCOUNTER — Encounter: Payer: Self-pay | Admitting: Cardiology

## 2016-03-06 LAB — CUP PACEART REMOTE DEVICE CHECK
Battery Remaining Longevity: 42 mo
Brady Statistic AP VP Percent: 0 %
Brady Statistic AP VS Percent: 0 %
Brady Statistic RV Percent Paced: 98.89 %
Date Time Interrogation Session: 20171219072605
HighPow Impedance: 80 Ohm
Implantable Lead Implant Date: 20141003
Implantable Lead Location: 753859
Implantable Lead Model: 4285
Implantable Lead Model: 4298
Lead Channel Impedance Value: 342 Ohm
Lead Channel Impedance Value: 399 Ohm
Lead Channel Impedance Value: 437 Ohm
Lead Channel Impedance Value: 437 Ohm
Lead Channel Impedance Value: 494 Ohm
Lead Channel Pacing Threshold Amplitude: 0.75 V
Lead Channel Pacing Threshold Pulse Width: 0.4 ms
Lead Channel Sensing Intrinsic Amplitude: 4.5 mV
Lead Channel Setting Pacing Amplitude: 2 V
Lead Channel Setting Pacing Amplitude: 2.25 V
Lead Channel Setting Pacing Pulse Width: 0.6 ms
MDC IDC LEAD IMPLANT DT: 20000210
MDC IDC LEAD IMPLANT DT: 20000210
MDC IDC LEAD LOCATION: 753858
MDC IDC LEAD LOCATION: 753860
MDC IDC LEAD SERIAL: 272469
MDC IDC LEAD SERIAL: 413633
MDC IDC MSMT BATTERY VOLTAGE: 2.96 V
MDC IDC MSMT LEADCHNL LV IMPEDANCE VALUE: 380 Ohm
MDC IDC MSMT LEADCHNL LV IMPEDANCE VALUE: 703 Ohm
MDC IDC MSMT LEADCHNL LV IMPEDANCE VALUE: 703 Ohm
MDC IDC MSMT LEADCHNL LV IMPEDANCE VALUE: 703 Ohm
MDC IDC MSMT LEADCHNL LV IMPEDANCE VALUE: 703 Ohm
MDC IDC MSMT LEADCHNL LV IMPEDANCE VALUE: 760 Ohm
MDC IDC MSMT LEADCHNL LV PACING THRESHOLD AMPLITUDE: 1.25 V
MDC IDC MSMT LEADCHNL LV PACING THRESHOLD PULSEWIDTH: 0.6 ms
MDC IDC MSMT LEADCHNL RA IMPEDANCE VALUE: 399 Ohm
MDC IDC MSMT LEADCHNL RA SENSING INTR AMPL: 0.875 mV
MDC IDC MSMT LEADCHNL RA SENSING INTR AMPL: 0.875 mV
MDC IDC MSMT LEADCHNL RV IMPEDANCE VALUE: 323 Ohm
MDC IDC MSMT LEADCHNL RV SENSING INTR AMPL: 4.5 mV
MDC IDC PG IMPLANT DT: 20141003
MDC IDC SET LEADCHNL RV PACING PULSEWIDTH: 0.4 ms
MDC IDC SET LEADCHNL RV SENSING SENSITIVITY: 0.3 mV
MDC IDC STAT BRADY AS VP PERCENT: 98.86 %
MDC IDC STAT BRADY AS VS PERCENT: 1.14 %
MDC IDC STAT BRADY RA PERCENT PACED: 0 %

## 2016-04-14 ENCOUNTER — Telehealth: Payer: Self-pay | Admitting: Pharmacist

## 2016-04-14 NOTE — Telephone Encounter (Signed)
Samples of this drug were given to the patient   Eliquis 5mg    Quantity 28 tabs  Lot Number [id number]

## 2016-04-16 ENCOUNTER — Telehealth: Payer: Self-pay | Admitting: Internal Medicine

## 2016-04-16 NOTE — Telephone Encounter (Signed)
Spoke with pts wife through interpreter line, informed her that from the pts defibrillator stand point it is ok to eat Spaghetti sauce, but from a heart healthy diet standpoint to monitor pts sodium intake. Pts wife appreciative of phone call.

## 2016-04-16 NOTE — Telephone Encounter (Signed)
New message       Wife want to know if pt can have spaghetti with pork.  He has a Estate manager/land agent

## 2016-04-20 ENCOUNTER — Encounter: Payer: Self-pay | Admitting: Internal Medicine

## 2016-04-20 ENCOUNTER — Ambulatory Visit (INDEPENDENT_AMBULATORY_CARE_PROVIDER_SITE_OTHER): Payer: PPO | Admitting: Internal Medicine

## 2016-04-20 VITALS — BP 130/76 | HR 79 | Ht 72.0 in | Wt 228.6 lb

## 2016-04-20 DIAGNOSIS — I5022 Chronic systolic (congestive) heart failure: Secondary | ICD-10-CM

## 2016-04-20 DIAGNOSIS — Z95810 Presence of automatic (implantable) cardiac defibrillator: Secondary | ICD-10-CM

## 2016-04-20 DIAGNOSIS — I255 Ischemic cardiomyopathy: Secondary | ICD-10-CM | POA: Diagnosis not present

## 2016-04-20 LAB — CUP PACEART INCLINIC DEVICE CHECK
Battery Remaining Longevity: 36 mo
Brady Statistic AP VP Percent: 0 %
Brady Statistic AP VS Percent: 0 %
Brady Statistic AS VP Percent: 98.91 %
Brady Statistic AS VS Percent: 1.09 %
HighPow Impedance: 66 Ohm
Implantable Lead Implant Date: 20000210
Implantable Lead Implant Date: 20141003
Implantable Lead Location: 753860
Implantable Lead Model: 4244
Implantable Lead Model: 4285
Implantable Lead Serial Number: 272469
Implantable Lead Serial Number: 413633
Lead Channel Impedance Value: 342 Ohm
Lead Channel Impedance Value: 342 Ohm
Lead Channel Impedance Value: 380 Ohm
Lead Channel Impedance Value: 399 Ohm
Lead Channel Impedance Value: 399 Ohm
Lead Channel Impedance Value: 456 Ohm
Lead Channel Impedance Value: 646 Ohm
Lead Channel Impedance Value: 646 Ohm
Lead Channel Pacing Threshold Amplitude: 0.75 V
Lead Channel Pacing Threshold Amplitude: 1.125 V
Lead Channel Pacing Threshold Pulse Width: 0.6 ms
Lead Channel Sensing Intrinsic Amplitude: 0.875 mV
Lead Channel Sensing Intrinsic Amplitude: 0.875 mV
Lead Channel Sensing Intrinsic Amplitude: 4.5 mV
Lead Channel Setting Pacing Pulse Width: 0.4 ms
Lead Channel Setting Pacing Pulse Width: 0.6 ms
Lead Channel Setting Sensing Sensitivity: 0.3 mV
MDC IDC LEAD IMPLANT DT: 20000210
MDC IDC LEAD LOCATION: 753858
MDC IDC LEAD LOCATION: 753859
MDC IDC MSMT BATTERY VOLTAGE: 2.95 V
MDC IDC MSMT LEADCHNL LV IMPEDANCE VALUE: 380 Ohm
MDC IDC MSMT LEADCHNL LV IMPEDANCE VALUE: 646 Ohm
MDC IDC MSMT LEADCHNL LV IMPEDANCE VALUE: 646 Ohm
MDC IDC MSMT LEADCHNL LV IMPEDANCE VALUE: 703 Ohm
MDC IDC MSMT LEADCHNL RV IMPEDANCE VALUE: 285 Ohm
MDC IDC MSMT LEADCHNL RV PACING THRESHOLD PULSEWIDTH: 0.4 ms
MDC IDC MSMT LEADCHNL RV SENSING INTR AMPL: 4.5 mV
MDC IDC PG IMPLANT DT: 20141003
MDC IDC SESS DTM: 20180205161507
MDC IDC SET LEADCHNL LV PACING AMPLITUDE: 2.25 V
MDC IDC SET LEADCHNL RV PACING AMPLITUDE: 2 V
MDC IDC STAT BRADY RA PERCENT PACED: 0 %
MDC IDC STAT BRADY RV PERCENT PACED: 98.99 %

## 2016-04-20 NOTE — Patient Instructions (Signed)
Medication Instructions:  Your physician recommends that you continue on your current medications as directed. Please refer to the Current Medication list given to you today.   Labwork: None Ordered   Testing/Procedures: None Ordered   Follow-Up: Your physician wants you to follow-up in: 1 year with Dr. [name]. You will receive a reminder letter in the mail two months in advance. If you don't receive a letter, please call our office to schedule the follow-up appointment.  Remote monitoring is used to monitor your ICD from home. This monitoring reduces the number of office visits required to check your device to one time per year. It allows [place] to keep an eye on the functioning of your device to ensure it is working properly. You are scheduled for a device check from home on 07/20/16. You may send your transmission at any time that day. If you have a wireless device, the transmission will be sent automatically. After your physician reviews your transmission, you will receive a postcard with your next transmission date.    Any Other Special Instructions Will Be Listed Below (If Applicable).     If you need a refill on your cardiac medications before your next appointment, please call your pharmacy.

## 2016-04-20 NOTE — Progress Notes (Signed)
HPI [name] returns today for followup. He is a pleasant 82 yo man with an ICM, chronic systolic CHF, LBBB, EF [id number]. He underwent BiV ICD implant over 3 years ago. In the interim, the patient is much improved. His chronic systolic heart failure has improved from class IIIB to class IIA and his LV function went from 30-35% up to 45-50%. He has had no syncope. He denies chest pain or peripheral edema. No ICD shock. He remains active. He admits to some dietary indiscretion. He is still working.  Allergies  Allergen Reactions  . Phenazopyridine Nausea Only and Other (See Comments)    Other Reaction: GI UPSET  . Ramipril Other (See Comments)     Current Outpatient Prescriptions  Medication Sig Dispense Refill  . albuterol (PROVENTIL HFA;VENTOLIN HFA) 108 (90 Base) MCG/ACT inhaler Inhale 2 puffs into the lungs every 6 (six) hours as needed for wheezing or shortness of breath. 1 Inhaler 0  . aspirin EC 81 MG EC tablet Take 1 tablet (81 mg total) by mouth daily. 30 tablet 1  . carvedilol (COREG) 12.5 MG tablet TAKE 1 TABLET TWICE A DAY APPT NEEDED FOR REFILLS 60 tablet 4  . ELIQUIS 5 MG TABS tablet TAKE 1 TABLET (5 MG TOTAL) BY MOUTH 2 (TWO) TIMES DAILY. 60 tablet 3  . furosemide (LASIX) 40 MG tablet Take 1 tablet (40 mg total) by mouth daily.    . isosorbide mononitrate (IMDUR) 60 MG 24 hr tablet TAKE 1.5 TABLETS (90 MG TOTAL) BY MOUTH DAILY. 135 tablet 0  . losartan-hydrochlorothiazide (HYZAAR) 50-12.5 MG tablet Take 1 tablet by mouth daily.    [name] oxymetazoline (AFRIN) 0.05 % nasal spray Place 2 sprays into both nostrils 2 (two) times daily as needed for congestion. Use as needed for nose bleeds 15 mL 2  . simvastatin (ZOCOR) 20 MG tablet Take 20 mg by mouth daily at 6 PM.    . tamsulosin (FLOMAX) 0.4 MG CAPS capsule Take 0.4 mg by mouth daily.  3  . vitamin B-12 (CYANOCOBALAMIN) 500 MCG tablet Take 500 mcg by mouth daily. Reported on 08/19/2015     No current facility-administered  medications for this visit.      Past Medical History:  Diagnosis Date  . Biventricular ICD (implantable cardioverter-defibrillator) in place 03/24/2005   Implantation of a Medtronic Adapta ADDRO1, serial number [id number]  . CHF (congestive heart failure) ([place])   . CKD (chronic kidney disease), stage III   . Coronary artery disease    a. s/p CABG 1986. b. Multiple PCIs/caths. c. 09/2013: s/p PTCA and BMS to SVG-OM.  [name]   . History of abdominal aortic aneurysm   . History of bleeding peptic ulcer 1980  . History of epididymitis 2013  . HTN (hypertension)   . Hydronephrosis with ureteropelvic junction obstruction   . Hydroureter on left 2009  . Hypertension   . Ischemic cardiomyopathy    a. Prior EF 30-35%, s/p BIV-ICD. b. 09/2013: EF 45-50%.  . Moderate tricuspid regurgitation   . PAF (paroxysmal atrial fibrillation) ([place])   . Prostate cancer ([place])   . Status post coronary artery bypass grafting 1986   LIMA to the LAD, SVG to OM, SVG to RCA  . Testicular swelling     ROS:   All systems reviewed and negative except as noted in the HPI.   Past Surgical History:  Procedure Laterality Date  . 2-D echocardiogram  11/20/2011   Ejection fraction 30-35% moderate concentric left  ventricular hypertrophy. Left atrium is moderately dilated. Mild MR. Mild or  . BI-VENTRICULAR IMPLANTABLE CARDIOVERTER DEFIBRILLATOR N/A 12/16/2012   Procedure: BI-VENTRICULAR IMPLANTABLE CARDIOVERTER DEFIBRILLATOR  (CRT-D);  Surgeon: Thomas Lance, MD;  Location[facility] CATH LAB;  Service: Cardiovascular;  Laterality: N/A;  . CARDIAC CATHETERIZATION  12/10/2011   SVG to OM widely patent.  LIMA to LAD patent  . CORONARY ARTERY BYPASS GRAFT  1986  . INSERT / REPLACE / REMOVE PACEMAKER    . LEFT HEART CATHETERIZATION WITH CORONARY/GRAFT ANGIOGRAM N/A 12/10/2011   Procedure: LEFT HEART CATHETERIZATION WITH Thomas [name];  Surgeon: Thomas Klein, MD;  Location: [facility] CATH LAB;  Service: Cardiovascular;   Laterality: N/A;  . LEFT HEART CATHETERIZATION WITH CORONARY/GRAFT ANGIOGRAM N/A 09/25/2013   Procedure: LEFT HEART CATHETERIZATION WITH Thomas [name];  Surgeon: Thomas Ohara, MD;  Location: [facility] CATH LAB;  Service: Cardiovascular;  Laterality: N/A;  . Persantine Myoview  05/06/2010   Post-rest ejection fraction 30%. No significant ischemia demonstrated. Compared to previous study there is no significant change.  . TRANSURETHRAL RESECTION OF PROSTATE     s/p     No family history on file.   Social History   Social History  . Marital status: Married    Spouse name: N/A  . Number of children: N/A  . Years of education: N/A   Occupational History  . Not on file.   Social History Main Topics  . Smoking status: Former Smoker    Quit date: 03/15/1985  . Smokeless tobacco: Never Used  . Alcohol use No     Comment: occas.  . Drug use: No  . Sexual activity: Not Currently   Other Topics Concern  . Not on file   Social History Narrative  . No narrative on file     BP 130/76   Pulse 79   Ht 6' (1.829 m)   Wt 228 lb 9.6 oz (103.7 kg)   SpO2 96%   BMI 31.00 kg/m   Physical Exam:  Well appearing 82yo man, NAD HEENT: Unremarkable Neck:  6 cm JVD, no thyromegally Lymphatics:  No adenopathy Back:  No CVA tenderness Lungs:  Clear with no wheezes HEART:  Regular rate rhythm, no murmurs, no rubs, no clicks Abd:  soft, positive bowel sounds, no organomegally, no rebound, no guarding Ext:  2 plus pulses, no edema, no cyanosis, no clubbing Skin:  No rashes no nodules Neuro:  CN II through XII intact, motor grossly intact  ECG - NSR with P synchronous biv pacing  DEVICE  Normal device function.  See PaceArt for details.   Assess/Plan: 1. Chronic systolic heart failure - his symptoms are well controlled. He will continue his current meds. 2. ICD - His medtronic Biv ICD is working normally. As he has not had any VT, I would consider downgrading to a BiV PPM  when comes time to change out. 3. HTN - his blood pressure is well controlled. He is encouraged to maintain a low sodium diet.  Thomas [name].D.

## 2016-04-23 ENCOUNTER — Emergency Department
Admission: EM | Admit: 2016-04-23 | Discharge: 2016-04-23 | Disposition: A | Discharge status: Home / Self Care | Payer: PPO | Attending: Emergency Medicine | Admitting: Emergency Medicine

## 2016-04-23 ENCOUNTER — Encounter: Payer: Self-pay | Admitting: Emergency Medicine

## 2016-04-23 ENCOUNTER — Emergency Department: Payer: PPO

## 2016-04-23 DIAGNOSIS — Z8546 Personal history of malignant neoplasm of prostate: Secondary | ICD-10-CM | POA: Diagnosis not present

## 2016-04-23 DIAGNOSIS — S40011A Contusion of right shoulder, initial encounter: Secondary | ICD-10-CM | POA: Insufficient documentation

## 2016-04-23 DIAGNOSIS — Z79899 Other long term (current) drug therapy: Secondary | ICD-10-CM | POA: Insufficient documentation

## 2016-04-23 DIAGNOSIS — S0081XA Abrasion of other part of head, initial encounter: Secondary | ICD-10-CM | POA: Diagnosis not present

## 2016-04-23 DIAGNOSIS — I2510 Atherosclerotic heart disease of native coronary artery without angina pectoris: Secondary | ICD-10-CM | POA: Diagnosis not present

## 2016-04-23 DIAGNOSIS — I130 Hypertensive heart and chronic kidney disease with heart failure and stage 1 through stage 4 chronic kidney disease, or unspecified chronic kidney disease: Secondary | ICD-10-CM | POA: Diagnosis not present

## 2016-04-23 DIAGNOSIS — I509 Heart failure, unspecified: Secondary | ICD-10-CM | POA: Diagnosis not present

## 2016-04-23 DIAGNOSIS — Z951 Presence of aortocoronary bypass graft: Secondary | ICD-10-CM | POA: Diagnosis not present

## 2016-04-23 DIAGNOSIS — Y939 Activity, unspecified: Secondary | ICD-10-CM | POA: Insufficient documentation

## 2016-04-23 DIAGNOSIS — W101XXA Fall (on)(from) sidewalk curb, initial encounter: Secondary | ICD-10-CM | POA: Insufficient documentation

## 2016-04-23 DIAGNOSIS — S0990XA Unspecified injury of head, initial encounter: Secondary | ICD-10-CM | POA: Diagnosis not present

## 2016-04-23 DIAGNOSIS — N183 Chronic kidney disease, stage 3 (moderate): Secondary | ICD-10-CM | POA: Insufficient documentation

## 2016-04-23 DIAGNOSIS — T148XXA Other injury of unspecified body region, initial encounter: Secondary | ICD-10-CM

## 2016-04-23 DIAGNOSIS — M25511 Pain in right shoulder: Secondary | ICD-10-CM | POA: Diagnosis not present

## 2016-04-23 DIAGNOSIS — Z7982 Long term (current) use of aspirin: Secondary | ICD-10-CM | POA: Diagnosis not present

## 2016-04-23 DIAGNOSIS — Z87891 Personal history of nicotine dependence: Secondary | ICD-10-CM | POA: Insufficient documentation

## 2016-04-23 DIAGNOSIS — Y929 Unspecified place or not applicable: Secondary | ICD-10-CM | POA: Insufficient documentation

## 2016-04-23 DIAGNOSIS — S4991XA Unspecified injury of right shoulder and upper arm, initial encounter: Secondary | ICD-10-CM | POA: Diagnosis not present

## 2016-04-23 DIAGNOSIS — S40211A Abrasion of right shoulder, initial encounter: Secondary | ICD-10-CM | POA: Diagnosis not present

## 2016-04-23 DIAGNOSIS — Y999 Unspecified external cause status: Secondary | ICD-10-CM | POA: Diagnosis not present

## 2016-04-23 NOTE — ED Provider Notes (Signed)
[facility] Emergency Department Provider Note ____________________________________________   I have reviewed the triage vital signs and the triage nursing note.  HISTORY  Chief Complaint Fall   Historian Patient through a sign language interpreter  HPI Thomas [name] is a 82 y.o. male who sustained a fall over a curb yesterday and is having persistent pain on the top of his right shoulder, worse with any abduction or tenderness raise the arm. He states that he also scratched his right knee and his right temple, but he had no loss of consciousness, nor any altered mental status nor any headaches. He is having no neck pain. No chest pain or trouble breathing. No recent illnesses.  He states that although his medication list states aspirin, he does not take a baby aspirin daily. I went back and asked him again about blood thinners, and he is on Eloquis.   Past Medical History:  Diagnosis Date  . Biventricular ICD (implantable cardioverter-defibrillator) in place 03/24/2005   Implantation of a Medtronic Adapta ADDRO1, serial number [id number]  . CHF (congestive heart failure) ([place])   . CKD (chronic kidney disease), stage III   . Coronary artery disease    a. s/p CABG 1986. b. Multiple PCIs/caths. c. 09/2013: s/p PTCA and BMS to SVG-OM.  [name]   . History of abdominal aortic aneurysm   . History of bleeding peptic ulcer 1980  . History of epididymitis 2013  . HTN (hypertension)   . Hydronephrosis with ureteropelvic junction obstruction   . Hydroureter on left 2009  . Hypertension   . Ischemic cardiomyopathy    a. Prior EF 30-35%, s/p BIV-ICD. b. 09/2013: EF 45-50%.  . Moderate tricuspid regurgitation   . PAF (paroxysmal atrial fibrillation) ([place])   . Prostate cancer ([place])   . Status post coronary artery bypass grafting 1986   LIMA to the LAD, SVG to OM, SVG to RCA  . Testicular swelling     Patient Active Problem List   Diagnosis Date Noted  . CVA  (cerebral infarction) 10/31/2015  . Bulbous urethral stricture 09/18/2015  . Bilateral deafness 08/19/2015  . Bilateral cataracts 08/19/2015  . Acid reflux 08/19/2015  . HLD (hyperlipidemia) 08/19/2015  . BP (high blood pressure) 08/19/2015  . Myocardial infarction 08/19/2015  . Calculus of kidney 08/19/2015  . Artificial cardiac pacemaker 08/19/2015  . Gastroduodenal ulcer 08/19/2015  . Dupuytren's contracture of foot 08/19/2015  . Malignant neoplasm of prostate ([place]) 08/19/2015  . Microhematuria 08/19/2015  . Coronary artery disease 02/22/2015  . Status post coronary artery bypass grafting 02/22/2015  . Benign essential HTN 04/02/2014  . Hematochezia 02/20/2014  . Moderate tricuspid regurgitation 02/20/2014  . Mobitz type II atrioventricular block 12/12/2013  . Long term current use of anticoagulant 10/18/2013  . Acute myocardial infarction, subendocardial infarction, initial episode of care ([place]) 09/21/2013  . Cardiomyopathy, ischemic: Ejection fraction 45% 11/11/2012  . CAD Status post coronary artery bypass grafting: 1995 11/11/2012  . Permanent atrial fibrillation ([place]) 11/11/2012  . Biventricular ICD Medtronic Viva single chamber October 2014 11/11/2012  . Chronic systolic heart failure ([place]) 11/11/2012    Past Surgical History:  Procedure Laterality Date  . 2-D echocardiogram  11/20/2011   Ejection fraction 30-35% moderate concentric left ventricular hypertrophy. Left atrium is moderately dilated. Mild MR. Mild or  . BI-VENTRICULAR IMPLANTABLE CARDIOVERTER DEFIBRILLATOR N/A 12/16/2012   Procedure: BI-VENTRICULAR IMPLANTABLE CARDIOVERTER DEFIBRILLATOR  (CRT-D);  Surgeon: [name], MD;  Location: [facility] CATH LAB;  Service: Cardiovascular;  Laterality:  N/A;  . CARDIAC CATHETERIZATION  12/10/2011   SVG to OM widely patent.  LIMA to LAD patent  . CORONARY ARTERY BYPASS GRAFT  1986  . INSERT / REPLACE / REMOVE PACEMAKER    . LEFT HEART CATHETERIZATION WITH CORONARY/GRAFT  ANGIOGRAM N/A 12/10/2011   Procedure: LEFT HEART CATHETERIZATION WITH Beatrix Fetters;  Surgeon: Sanda Klein, MD;  Location: [place] CATH LAB;  Service: Cardiovascular;  Laterality: N/A;  . LEFT HEART CATHETERIZATION WITH CORONARY/GRAFT ANGIOGRAM N/A 09/25/2013   Procedure: LEFT HEART CATHETERIZATION WITH Beatrix Fetters;  Surgeon: Blane Ohara, MD;  Location: [facility] CATH LAB;  Service: Cardiovascular;  Laterality: N/A;  . Persantine Myoview  05/06/2010   Post-rest ejection fraction 30%. No significant ischemia demonstrated. Compared to previous study there is no significant change.  . TRANSURETHRAL RESECTION OF PROSTATE     s/p    Prior to Admission medications   Medication Sig Start Date End Date Taking? Authorizing Provider  albuterol (PROVENTIL HFA;VENTOLIN HFA) 108 (90 Base) MCG/ACT inhaler Inhale 2 puffs into the lungs every 6 (six) hours as needed for wheezing or shortness of breath. 04/02/15   Mihai Croitoru, MD  aspirin EC 81 MG EC tablet Take 1 tablet (81 mg total) by mouth daily. 11/01/15   Fritzi Mandes, MD  carvedilol (COREG) 12.5 MG tablet TAKE 1 TABLET TWICE A DAY APPT NEEDED FOR REFILLS 12/06/15   Mihai Croitoru, MD  ELIQUIS 5 MG TABS tablet TAKE 1 TABLET (5 MG TOTAL) BY MOUTH 2 (TWO) TIMES DAILY. 11/15/15   Mihai Croitoru, MD  furosemide (LASIX) 40 MG tablet Take 1 tablet (40 mg total) by mouth daily. 09/27/13   Dayna N Dunn, PA-C  isosorbide mononitrate (IMDUR) 60 MG 24 hr tablet TAKE 1.5 TABLETS (90 MG TOTAL) BY MOUTH DAILY. 11/06/15   Mihai Croitoru, MD  losartan-hydrochlorothiazide (HYZAAR) 50-12.5 MG tablet Take 1 tablet by mouth daily.    Historical Provider, MD  oxymetazoline (AFRIN) 0.05 % nasal spray Place 2 sprays into both nostrils 2 (two) times daily as needed for congestion. Use as needed for nose bleeds 08/24/14   Earleen Newport, MD  simvastatin (ZOCOR) 20 MG tablet Take 20 mg by mouth daily at 6 PM.    Historical Provider, MD  tamsulosin (FLOMAX) 0.4 MG  CAPS capsule Take 0.4 mg by mouth daily. 10/24/15   Historical Provider, MD  vitamin B-12 (CYANOCOBALAMIN) 500 MCG tablet Take 500 mcg by mouth daily. Reported on 08/19/2015    Historical Provider, MD    Allergies  Allergen Reactions  . Phenazopyridine Nausea Only and Other (See Comments)    Other Reaction: GI UPSET  . Ramipril Other (See Comments)    No family history on file.  Social History Social History  Substance Use Topics  . Smoking status: Former Smoker    Quit date: 03/15/1985  . Smokeless tobacco: Never Used  . Alcohol use No     Comment: occas.    Review of Systems  Constitutional: Negative for fever. Eyes: Negative for visual changes. ENT: Negative for sore throat. Cardiovascular: Negative for chest pain. Respiratory: Negative for shortness of breath. Gastrointestinal: Negative for abdominal pain, vomiting and diarrhea. Genitourinary: Negative for dysuria. Musculoskeletal: Negative for back pain.  Negative for neck pain. Positive for right shoulder pain as per history of present illness. Skin: Negative for rash. Neurological: Negative for headache. 10 point Review of Systems otherwise negative ____________________________________________   PHYSICAL EXAM:  VITAL SIGNS: ED Triage Vitals  Enc Vitals Group  BP 04/23/16 0949 140/74     Pulse Rate 04/23/16 0949 65     Resp 04/23/16 0949 18     Temp 04/23/16 0949 97.6 F (36.4 C)     Temp Source 04/23/16 0949 Oral     SpO2 04/23/16 0949 96 %     Weight 04/23/16 0950 228 lb (103.4 kg)     Height 04/23/16 0950 6' (1.829 m)     Head Circumference --      Peak Flow --      Pain Score 04/23/16 0950 10     Pain Loc --      Pain Edu? --      Excl. in [place]? --      Constitutional: Alert and Cooperative. Well appearing and in no distress. HEENT   Head: Normocephalic.  Small abrasion right forehead/temple      Eyes: Conjunctivae are normal. PERRL. Normal extraocular movements.      Ears:         Nose:  No congestion/rhinnorhea.   Mouth/Throat: Mucous membranes are moist.   Neck: No stridor.  No midline C-spine tenderness to palpation or range of motion. Cardiovascular/Chest: Normal rate, regular rhythm.  No murmurs, rubs, or gallops. Respiratory: Normal respiratory effort without tachypnea nor retractions. Breath sounds are clear and equal bilaterally. No wheezes/rales/rhonchi. Gastrointestinal: Soft. No distention, no guarding, no rebound. Nontender.    Genitourinary/rectal:Deferred Musculoskeletal: Swelling and tenderness anterior/top of the shoulder. Range of motion of the shoulder due to pain. Neurologic:  Normal speech and language. No gross or focal neurologic deficits are appreciated. Skin:  Skin is warm, dry and intact. No rash noted. Psychiatric: Mood and affect are normal. Speech and behavior are normal. Patient exhibits appropriate insight and judgment.   ____________________________________________  LABS (pertinent positives/negatives)  Labs Reviewed - No data to display  ____________________________________________    EKG I, Lisa Roca, MD, the attending physician have personally viewed and interpreted all ECGs.  60 bpm. Electronic ventricular pacemaker ____________________________________________  RADIOLOGY All Xrays were viewed by me. Imaging interpreted by Radiologist.  Shoulder right complete: IMPRESSION: Severe osteoarthritic change centered on the glenohumeral joint with milder changes of the [facility] joint. No definite acute displaced fracture but an impacted fracture is not absolutely excluded. CT scanning of the right shoulder is recommended.  CT head without contrast: IMPRESSION: 1. No evidence for acute intracranial abnormality. 2. Atrophy and small vessel disease. 3. Remote lacunar infarct of the left thalamus. 4. Chronic sinus disease. 5. Left mastoid effusion.  CT shoulder right:  IMPRESSION: 1. Moderate-to-marked osteoarthritis of the  glenohumeral joint. 2. Mild-to-moderate osteoarthritis of the AC joint undersurface spurring. 3. No acute fracture.  No dislocations or subluxations. 4. Rotator cuff calcific tendinopathy along the supraspinatus and subscapularis tendons. No retraction or full-thickness tear identified. __________________________________________  PROCEDURES  Procedure(s) performed: None  Critical Care performed: None  ____________________________________________   ED COURSE / ASSESSMENT AND PLAN  Pertinent labs & imaging results that were available during my care of the patient were reviewed by me and considered in my medical decision making (see chart for details).  It sounds like he essentially tripped over a curb, doesn't sound like anything medical going on or syncope in terms of the fall.  He came in for traumatic evaluation of the right shoulder where there is a pump and exquisite tenderness.  Since is on Eloquis, I did discuss and recommend CT head imaging given clear knocked his head.  Right shoulder x-ray uncertain about whether or not  there is an underlying fracture, radiologist had recommended CT.  CT the head is negative for acute traumatic findings Right shoulder CT shows no acute fracture. I did discuss with patient that if ongoing discomfort after a week or so, may pursue orthopedic referral or primary care may consider MRI for further evaluation of the rotator cuff.    CONSULTATIONS:   None   Patient / Family / Caregiver informed of clinical course, medical decision-making process, and agree with plan.   I discussed return precautions, follow-up instructions, and discharge instructions with patient and/or family.   ___________________________________________   FINAL CLINICAL IMPRESSION(S) / ED DIAGNOSES   Final diagnoses:  Abrasion  Contusion of right shoulder, initial encounter              Note: This dictation was prepared with Dragon dictation. Any  transcriptional errors that result from this process are unintentional    Lisa Roca, MD 04/23/16 1447

## 2016-04-23 NOTE — ED Triage Notes (Signed)
Pt reports falling yesterday. Pt states he was a little dizzy prior to fall. Pt reports hitting his head. Denies LOC. Pt reports head, right shoulder, and right knee pain.

## 2016-04-23 NOTE — ED Notes (Signed)
Pt arrives from [facility] in wheelchair. Pt deaf, interpreter paged.

## 2016-04-23 NOTE — ED Notes (Signed)
Using an ASL interpreter named "Pat" this RN assessed patient who reported that he tripped and fell on a slippery sidewalk yesterday afternoon after work and that he is having pain and swelling to his right shoulder.  Patient reports an abrasion to his right knee and a small abrasion to the right side of his forehead.  Patient reports less than normal range of motion of his right shoulder.

## 2016-04-23 NOTE — ED Notes (Signed)
MD in room to assess patient with ASL interpreter.

## 2016-04-23 NOTE — ED Notes (Signed)
ASL Interpreter Lawerance Bach [phone] left number to call when patient is roomed and services are needed.

## 2016-04-23 NOTE — Discharge Instructions (Signed)
Return to the emergency department for any worsening pain, weakness, numbness.  Follow up with your primary doctor in 1-2 weeks, and you may follow with orthopedic surgeon if you have persistent pain.

## 2016-04-23 NOTE — ED Notes (Signed)
Called interpreter services and scheduled tabitha interpreter to interpret at 12:15pm.

## 2016-05-07 DIAGNOSIS — I679 Cerebrovascular disease, unspecified: Secondary | ICD-10-CM | POA: Diagnosis not present

## 2016-05-07 DIAGNOSIS — I10 Essential (primary) hypertension: Secondary | ICD-10-CM | POA: Diagnosis not present

## 2016-05-13 ENCOUNTER — Other Ambulatory Visit: Payer: Self-pay | Admitting: Cardiovascular Disease

## 2016-05-18 ENCOUNTER — Other Ambulatory Visit: Payer: Self-pay | Admitting: Cardiovascular Disease

## 2016-05-18 MED ORDER — SIMVASTATIN 20 MG PO TABS: 20.0000 mg | ORAL_TABLET | Freq: Every day | ORAL | 0 refills | Status: DC

## 2016-05-18 NOTE — Telephone Encounter (Signed)
New Message   Patient calling the office for samples of medication:   1.  What medication and dosage are you requesting samples for?   ELIQUIS 5 MG TABS tablet   2.  Are you currently out of this medication? Yes  Per pt wife has to leave before 1:30pm and If at all possible would like a call back by then

## 2016-05-18 NOTE — Telephone Encounter (Signed)
New Message  Patient calling the office for samples of medication:   1.  What medication and dosage are you requesting samples for? simvastatin 20 mg tablet mouth daily at 6pm  2.  Are you currently out of this medication?  Yes  Pt called with an interpretor and pt will need one when called back.

## 2016-05-19 NOTE — Telephone Encounter (Signed)
Follow up    Pt wife is calling to follow up. She says she has not heard anything about samples.

## 2016-05-19 NOTE — Telephone Encounter (Signed)
F/u message  Pt wife call to f/u on medication sample requested. Please call back to discuss

## 2016-05-20 ENCOUNTER — Other Ambulatory Visit: Payer: Self-pay | Admitting: Cardiovascular Disease

## 2016-05-20 NOTE — Telephone Encounter (Signed)
Received call from wife via interpreter-requesting samples for eliquis 5mg .  Medication samples have been provided to the patient.  Drug name: Eliquis 5mg   Qty: 21 LOT: [id number]  Exp.Date: 3/20  Samples left at front desk for patient pick-up.

## 2016-05-21 NOTE — Telephone Encounter (Signed)
Rx(s) sent to pharmacy electronically.  

## 2016-05-29 ENCOUNTER — Other Ambulatory Visit: Payer: Self-pay | Admitting: Cardiovascular Disease

## 2016-05-29 NOTE — Telephone Encounter (Signed)
Rx(s) sent to pharmacy electronically.  

## 2016-05-30 ENCOUNTER — Other Ambulatory Visit: Payer: Self-pay | Admitting: Cardiovascular Disease

## 2016-06-02 ENCOUNTER — Other Ambulatory Visit: Payer: Self-pay | Admitting: Cardiovascular Disease

## 2016-06-03 ENCOUNTER — Other Ambulatory Visit: Payer: Self-pay

## 2016-06-03 ENCOUNTER — Encounter: Payer: Self-pay | Admitting: Cardiovascular Disease

## 2016-06-03 MED ORDER — ISOSORBIDE MONONITRATE ER 60 MG PO TB24: 90.0000 mg | ORAL_TABLET | Freq: Every day | ORAL | 5 refills | Status: DC

## 2016-07-01 ENCOUNTER — Other Ambulatory Visit: Payer: Self-pay | Admitting: Cardiovascular Disease

## 2016-07-02 ENCOUNTER — Telehealth: Payer: Self-pay | Admitting: Cardiovascular Disease

## 2016-07-02 NOTE — Telephone Encounter (Signed)
Returned the phone call to the patient's wife, per DPR. She stated that she would like Eliquis 5 mg samples (Eliquis 5mg  twice daily).   Medication Samples have been provided to the patient.  Drug name: Eliquis        Strength: 5 mg         Qty: 3 boxes (42 tablets)   LOT: [id number]   Exp.Date: 05/2018  The patient's wife has been instructed regarding the dose, and frequency of taking this medication. The patient's wife verbalized her understanding. They will be up front for them.

## 2016-07-02 NOTE — Telephone Encounter (Signed)
New message    Patient calling the office for samples of medication:   1.  What medication and dosage are you requesting samples for? 12.5 mg Eliquis   2.  Are you currently out of this medication?  Yes  , medicare will not kick into July can you give them 3 months worth

## 2016-07-15 ENCOUNTER — Emergency Department: Payer: Managed Care, Other (non HMO)

## 2016-07-15 ENCOUNTER — Observation Stay
Admission: EM | Admit: 2016-07-15 | Discharge: 2016-07-15 | Disposition: A | Discharge status: Home / Self Care | Payer: Managed Care, Other (non HMO) | Attending: Internal Medicine | Admitting: Internal Medicine

## 2016-07-15 ENCOUNTER — Encounter: Payer: Self-pay | Admitting: Emergency Medicine

## 2016-07-15 DIAGNOSIS — R42 Dizziness and giddiness: Secondary | ICD-10-CM | POA: Insufficient documentation

## 2016-07-15 DIAGNOSIS — I130 Hypertensive heart and chronic kidney disease with heart failure and stage 1 through stage 4 chronic kidney disease, or unspecified chronic kidney disease: Secondary | ICD-10-CM | POA: Diagnosis not present

## 2016-07-15 DIAGNOSIS — Z8546 Personal history of malignant neoplasm of prostate: Secondary | ICD-10-CM | POA: Insufficient documentation

## 2016-07-15 DIAGNOSIS — I255 Ischemic cardiomyopathy: Secondary | ICD-10-CM | POA: Insufficient documentation

## 2016-07-15 DIAGNOSIS — N183 Chronic kidney disease, stage 3 (moderate): Secondary | ICD-10-CM | POA: Insufficient documentation

## 2016-07-15 DIAGNOSIS — J069 Acute upper respiratory infection, unspecified: Secondary | ICD-10-CM | POA: Diagnosis not present

## 2016-07-15 DIAGNOSIS — I2510 Atherosclerotic heart disease of native coronary artery without angina pectoris: Secondary | ICD-10-CM | POA: Insufficient documentation

## 2016-07-15 DIAGNOSIS — I248 Other forms of acute ischemic heart disease: Secondary | ICD-10-CM | POA: Insufficient documentation

## 2016-07-15 DIAGNOSIS — I5022 Chronic systolic (congestive) heart failure: Secondary | ICD-10-CM | POA: Diagnosis not present

## 2016-07-15 DIAGNOSIS — Z9181 History of falling: Secondary | ICD-10-CM | POA: Diagnosis not present

## 2016-07-15 DIAGNOSIS — Z7901 Long term (current) use of anticoagulants: Secondary | ICD-10-CM | POA: Insufficient documentation

## 2016-07-15 DIAGNOSIS — A084 Viral intestinal infection, unspecified: Secondary | ICD-10-CM | POA: Insufficient documentation

## 2016-07-15 DIAGNOSIS — H9193 Unspecified hearing loss, bilateral: Secondary | ICD-10-CM | POA: Diagnosis present

## 2016-07-15 DIAGNOSIS — I420 Dilated cardiomyopathy: Secondary | ICD-10-CM | POA: Insufficient documentation

## 2016-07-15 DIAGNOSIS — Z955 Presence of coronary angioplasty implant and graft: Secondary | ICD-10-CM | POA: Diagnosis not present

## 2016-07-15 DIAGNOSIS — E785 Hyperlipidemia, unspecified: Secondary | ICD-10-CM | POA: Insufficient documentation

## 2016-07-15 DIAGNOSIS — I25708 Atherosclerosis of coronary artery bypass graft(s), unspecified, with other forms of angina pectoris: Secondary | ICD-10-CM

## 2016-07-15 DIAGNOSIS — Z87891 Personal history of nicotine dependence: Secondary | ICD-10-CM | POA: Insufficient documentation

## 2016-07-15 DIAGNOSIS — I481 Persistent atrial fibrillation: Secondary | ICD-10-CM | POA: Insufficient documentation

## 2016-07-15 DIAGNOSIS — R739 Hyperglycemia, unspecified: Secondary | ICD-10-CM | POA: Insufficient documentation

## 2016-07-15 DIAGNOSIS — E860 Dehydration: Secondary | ICD-10-CM | POA: Diagnosis not present

## 2016-07-15 DIAGNOSIS — R778 Other specified abnormalities of plasma proteins: Secondary | ICD-10-CM

## 2016-07-15 DIAGNOSIS — R55 Syncope and collapse: Secondary | ICD-10-CM | POA: Diagnosis not present

## 2016-07-15 DIAGNOSIS — R5383 Other fatigue: Secondary | ICD-10-CM

## 2016-07-15 DIAGNOSIS — I482 Chronic atrial fibrillation: Secondary | ICD-10-CM | POA: Diagnosis not present

## 2016-07-15 DIAGNOSIS — R7989 Other specified abnormal findings of blood chemistry: Secondary | ICD-10-CM

## 2016-07-15 DIAGNOSIS — I5042 Chronic combined systolic (congestive) and diastolic (congestive) heart failure: Secondary | ICD-10-CM | POA: Diagnosis present

## 2016-07-15 DIAGNOSIS — Z95810 Presence of automatic (implantable) cardiac defibrillator: Secondary | ICD-10-CM | POA: Diagnosis not present

## 2016-07-15 DIAGNOSIS — I4821 Permanent atrial fibrillation: Secondary | ICD-10-CM | POA: Diagnosis present

## 2016-07-15 DIAGNOSIS — R748 Abnormal levels of other serum enzymes: Secondary | ICD-10-CM

## 2016-07-15 DIAGNOSIS — I252 Old myocardial infarction: Secondary | ICD-10-CM | POA: Insufficient documentation

## 2016-07-15 DIAGNOSIS — R197 Diarrhea, unspecified: Secondary | ICD-10-CM

## 2016-07-15 DIAGNOSIS — Z7982 Long term (current) use of aspirin: Secondary | ICD-10-CM | POA: Insufficient documentation

## 2016-07-15 DIAGNOSIS — R531 Weakness: Secondary | ICD-10-CM | POA: Diagnosis present

## 2016-07-15 DIAGNOSIS — Z79899 Other long term (current) drug therapy: Secondary | ICD-10-CM | POA: Insufficient documentation

## 2016-07-15 DIAGNOSIS — Z8673 Personal history of transient ischemic attack (TIA), and cerebral infarction without residual deficits: Secondary | ICD-10-CM | POA: Insufficient documentation

## 2016-07-15 DIAGNOSIS — I480 Paroxysmal atrial fibrillation: Secondary | ICD-10-CM | POA: Insufficient documentation

## 2016-07-15 DIAGNOSIS — Z951 Presence of aortocoronary bypass graft: Secondary | ICD-10-CM | POA: Insufficient documentation

## 2016-07-15 DIAGNOSIS — I2489 Other forms of acute ischemic heart disease: Secondary | ICD-10-CM | POA: Diagnosis present

## 2016-07-15 LAB — COMPREHENSIVE METABOLIC PANEL
ALBUMIN: 3.9 g/dL (ref 3.5–5.0)
ALT: 19 U/L (ref 17–63)
ANION GAP: 6 (ref 5–15)
AST: 33 U/L (ref 15–41)
Alkaline Phosphatase: 116 U/L (ref 38–126)
BILIRUBIN TOTAL: 1.7 mg/dL — AB (ref 0.3–1.2)
BUN: 27 mg/dL — AB (ref 6–20)
CALCIUM: 9.6 mg/dL (ref 8.9–10.3)
CO2: 25 mmol/L (ref 22–32)
Chloride: 109 mmol/L (ref 101–111)
Creatinine, Ser: 1.12 mg/dL (ref 0.61–1.24)
GFR calc Af Amer: >60 mL/min (ref >60)
GLUCOSE: 195 mg/dL — AB (ref 65–99)
Potassium: 4.1 mmol/L (ref 3.5–5.1)
Sodium: 140 mmol/L (ref 135–145)
TOTAL PROTEIN: 7.1 g/dL (ref 6.5–8.1)

## 2016-07-15 LAB — TROPONIN I
TROPONIN I: 0.06 ng/mL — AB (ref <0.03)
TROPONIN I: 0.03 ng/mL — AB (ref <0.03)

## 2016-07-15 LAB — CBC WITH DIFFERENTIAL/PLATELET
BASOS PCT: 1 %
Basophils Absolute: 0 10*3/uL (ref 0–0.1)
Eosinophils Absolute: 0.3 10*3/uL (ref 0–0.7)
Eosinophils Relative: 6 %
HEMATOCRIT: 39.1 % — AB (ref 40.0–52.0)
HEMOGLOBIN: 13.4 g/dL (ref 13.0–18.0)
LYMPHS ABS: 0.9 10*3/uL — AB (ref 1.0–3.6)
Lymphocytes Relative: 17 %
MCH: 31.9 pg (ref 26.0–34.0)
MCHC: 34.2 g/dL (ref 32.0–36.0)
MCV: 93.5 fL (ref 80.0–100.0)
MONO ABS: 0.5 10*3/uL (ref 0.2–1.0)
MONOS PCT: 10 %
NEUTROS ABS: 3.5 10*3/uL (ref 1.4–6.5)
NEUTROS PCT: 66 %
Platelets: 133 10*3/uL — ABNORMAL LOW (ref 150–440)
RBC: 4.18 MIL/uL — ABNORMAL LOW (ref 4.40–5.90)
RDW: 15.2 % — ABNORMAL HIGH (ref 11.5–14.5)
WBC: 5.2 10*3/uL (ref 3.8–10.6)

## 2016-07-15 LAB — URINALYSIS, COMPLETE (UACMP) WITH MICROSCOPIC
Bacteria, UA: NONE SEEN
Bilirubin Urine: NEGATIVE
Glucose, UA: NEGATIVE mg/dL
Ketones, ur: NEGATIVE mg/dL
LEUKOCYTES UA: NEGATIVE
Nitrite: NEGATIVE
Protein, ur: NEGATIVE mg/dL
SPECIFIC GRAVITY, URINE: 1.019 (ref 1.005–1.030)
SQUAMOUS EPITHELIAL / LPF: NONE SEEN
pH: 5 (ref 5.0–8.0)

## 2016-07-15 LAB — LACTIC ACID, PLASMA: LACTIC ACID, VENOUS: 1.6 mmol/L (ref 0.5–1.9)

## 2016-07-15 MED ORDER — INSULIN ASPART 100 UNIT/ML ~~LOC~~ SOLN: 0.0000 [IU] | Freq: Three times a day (TID) | SUBCUTANEOUS | Status: DC

## 2016-07-15 MED ORDER — ACETAMINOPHEN 650 MG RE SUPP: 650.0000 mg | Freq: Four times a day (QID) | RECTAL | Status: DC | PRN

## 2016-07-15 MED ORDER — INSULIN ASPART 100 UNIT/ML ~~LOC~~ SOLN: 0.0000 [IU] | Freq: Every day | SUBCUTANEOUS | Status: DC

## 2016-07-15 MED ORDER — SENNOSIDES-DOCUSATE SODIUM 8.6-50 MG PO TABS: 1.0000 | ORAL_TABLET | Freq: Every evening | ORAL | Status: DC | PRN

## 2016-07-15 MED ORDER — VITAMIN B-12 1000 MCG PO TABS: 500.0000 ug | ORAL_TABLET | Freq: Every day | ORAL | Status: DC

## 2016-07-15 MED ORDER — SIMVASTATIN 20 MG PO TABS: 20.0000 mg | ORAL_TABLET | Freq: Every day | ORAL | Status: DC

## 2016-07-15 MED ORDER — CARVEDILOL 3.125 MG PO TABS: 3.1250 mg | ORAL_TABLET | Freq: Two times a day (BID) | ORAL | Status: DC

## 2016-07-15 MED ORDER — HYDROCHLOROTHIAZIDE 12.5 MG PO CAPS: 12.5000 mg | ORAL_CAPSULE | Freq: Every day | ORAL | Status: DC

## 2016-07-15 MED ORDER — ALBUTEROL SULFATE HFA 108 (90 BASE) MCG/ACT IN AERS: 2.0000 | INHALATION_SPRAY | Freq: Four times a day (QID) | RESPIRATORY_TRACT | Status: DC | PRN

## 2016-07-15 MED ORDER — APIXABAN 5 MG PO TABS: 5.0000 mg | ORAL_TABLET | Freq: Two times a day (BID) | ORAL | Status: DC

## 2016-07-15 MED ORDER — SODIUM CHLORIDE 0.9 % IV BOLUS (SEPSIS)
500.0000 mL | Freq: Once | INTRAVENOUS | Status: AC
  Administered 2016-07-15: 500 mL via INTRAVENOUS

## 2016-07-15 MED ORDER — ONDANSETRON HCL 4 MG/2ML IJ SOLN: 4.0000 mg | Freq: Four times a day (QID) | INTRAMUSCULAR | Status: DC | PRN

## 2016-07-15 MED ORDER — HYDROCODONE-ACETAMINOPHEN 5-325 MG PO TABS: 1.0000 | ORAL_TABLET | ORAL | Status: DC | PRN

## 2016-07-15 MED ORDER — TAMSULOSIN HCL 0.4 MG PO CAPS: 0.4000 mg | ORAL_CAPSULE | Freq: Every day | ORAL | Status: DC

## 2016-07-15 MED ORDER — ONDANSETRON HCL 4 MG PO TABS: 4.0000 mg | ORAL_TABLET | Freq: Four times a day (QID) | ORAL | Status: DC | PRN

## 2016-07-15 MED ORDER — ONDANSETRON HCL 4 MG/2ML IJ SOLN
4.0000 mg | Freq: Once | INTRAMUSCULAR | Status: AC
  Administered 2016-07-15: 4 mg via INTRAVENOUS
  Filled 2016-07-15: qty 2

## 2016-07-15 MED ORDER — LOSARTAN POTASSIUM-HCTZ 50-12.5 MG PO TABS: 1.0000 | ORAL_TABLET | Freq: Every day | ORAL | Status: DC

## 2016-07-15 MED ORDER — LOSARTAN POTASSIUM 50 MG PO TABS: 50.0000 mg | ORAL_TABLET | Freq: Every day | ORAL | Status: DC

## 2016-07-15 MED ORDER — ALBUTEROL SULFATE (2.5 MG/3ML) 0.083% IN NEBU
2.5000 mg | INHALATION_SOLUTION | Freq: Four times a day (QID) | RESPIRATORY_TRACT | Status: DC | PRN
Start: 2016-07-15 — End: 2016-07-15

## 2016-07-15 MED ORDER — ACETAMINOPHEN 325 MG PO TABS: 650.0000 mg | ORAL_TABLET | Freq: Four times a day (QID) | ORAL | Status: DC | PRN

## 2016-07-15 MED ORDER — ISOSORBIDE MONONITRATE ER 60 MG PO TB24: 90.0000 mg | ORAL_TABLET | Freq: Every day | ORAL | Status: DC

## 2016-07-15 MED ORDER — FUROSEMIDE 40 MG PO TABS: 40.0000 mg | ORAL_TABLET | Freq: Every day | ORAL | Status: DC

## 2016-07-15 NOTE — H&P (Signed)
[facility] [place] NAME: Thomas [name]    MR#:  [id number]  DATE OF BIRTH:  Jan 11, 1935  DATE OF ADMISSION:  07/15/2016  PRIMARY CARE PHYSICIAN: [name], MD   REQUESTING/REFERRING PHYSICIAN: dr sung  CHIEF COMPLAINT:   Near syncope  History of present illness taken via sign interpreter HISTORY OF PRESENT ILLNESS:  Thomas [name]  is a 82 y.o. male with a known history of AICD, CKD stage 3 and CAD who Since the EMS due to generalized weakness and near syncope. For the past day patient has had generalized weakness and feelings of lightheadedness. He almost fainted and thought he may have had [place] fever yesterday. He was brought to the ER for further evaluation. Troponin was 0.06. He has not had a fever while in the emergency room. Patient denies chest pain, shortness of breath, worsening of lower extremity edema, PND or orthopnea. He denies cough, nausea or vomiting.  PAST MEDICAL HISTORY:   Past Medical History:  Diagnosis Date  . Biventricular ICD (implantable cardioverter-defibrillator) in place 03/24/2005   Implantation of a Medtronic Adapta ADDRO1, serial number [id number]  . CHF (congestive heart failure) ([place])   . CKD (chronic kidney disease), stage III   . Coronary artery disease    a. s/p CABG 1986. b. Multiple PCIs/caths. c. 09/2013: s/p PTCA and BMS to SVG-OM.  [name]   . History of abdominal aortic aneurysm   . History of bleeding peptic ulcer 1980  . History of epididymitis 2013  . HTN (hypertension)   . Hydronephrosis with ureteropelvic junction obstruction   . Hydroureter on left 2009  . Hypertension   . Ischemic cardiomyopathy    a. Prior EF 30-35%, s/p BIV-ICD. b. 09/2013: EF 45-50%.  . Moderate tricuspid regurgitation   . PAF (paroxysmal atrial fibrillation) ([place])   . Prostate cancer ([place])   . Status post coronary artery bypass grafting 1986   LIMA to the LAD, SVG to OM, SVG to RCA  . Testicular swelling      PAST SURGICAL HISTORY:   Past Surgical History:  Procedure Laterality Date  . 2-D echocardiogram  11/20/2011   Ejection fraction 30-35% moderate concentric left ventricular hypertrophy. Left atrium is moderately dilated. Mild MR. Mild or  . BI-VENTRICULAR IMPLANTABLE CARDIOVERTER DEFIBRILLATOR N/A 12/16/2012   Procedure: BI-VENTRICULAR IMPLANTABLE CARDIOVERTER DEFIBRILLATOR  (CRT-D);  Surgeon: [name], MD;  Location: [facility] CATH LAB;  Service: Cardiovascular;  Laterality: N/A;  . CARDIAC CATHETERIZATION  12/10/2011   SVG to OM widely patent.  LIMA to LAD patent  . CORONARY ARTERY BYPASS GRAFT  1986  . INSERT / REPLACE / REMOVE PACEMAKER    . LEFT HEART CATHETERIZATION WITH CORONARY/GRAFT ANGIOGRAM N/A 12/10/2011   Procedure: LEFT HEART CATHETERIZATION WITH Beatrix Fetters;  Surgeon: Sanda Klein, MD;  Location: [facility] CATH LAB;  Service: Cardiovascular;  Laterality: N/A;  . LEFT HEART CATHETERIZATION WITH CORONARY/GRAFT ANGIOGRAM N/A 09/25/2013   Procedure: LEFT HEART CATHETERIZATION WITH Beatrix Fetters;  Surgeon: Blane Ohara, MD;  Location: [facility] CATH LAB;  Service: Cardiovascular;  Laterality: N/A;  . Persantine Myoview  05/06/2010   Post-rest ejection fraction 30%. No significant ischemia demonstrated. Compared to previous study there is no significant change.  . TRANSURETHRAL RESECTION OF PROSTATE     s/p    SOCIAL HISTORY:   Social History  Substance Use Topics  . Smoking status: Former Smoker    Quit date: 03/15/1985  . Smokeless tobacco: Never Used  .  Alcohol use No     Comment: occas.    FAMILY HISTORY:  Arthritis HTN  DRUG ALLERGIES:   Allergies  Allergen Reactions  . Phenazopyridine Nausea Only and Other (See Comments)    Other Reaction: GI UPSET  . Ramipril Other (See Comments)    REVIEW OF SYSTEMS:   Review of Systems  Constitutional: Positive for malaise/fatigue. Negative for chills and fever.  HENT: Negative.  Negative for ear  discharge, ear pain, hearing loss, nosebleeds and sore throat.   Eyes: Negative.  Negative for blurred vision and pain.  Respiratory: Negative.  Negative for cough, hemoptysis, shortness of breath and wheezing.   Cardiovascular: Negative.  Negative for chest pain, palpitations and leg swelling.  Gastrointestinal: Negative.  Negative for abdominal pain, blood in stool, diarrhea, nausea and vomiting.  Genitourinary: Negative.  Negative for dysuria.  Musculoskeletal: Negative.  Negative for back pain.  Skin: Negative.   Neurological: Positive for weakness. Negative for dizziness, tremors, speech change, focal weakness, seizures and headaches.  Endo/Heme/Allergies: Negative.  Does not bruise/bleed easily.  Psychiatric/Behavioral: Negative.  Negative for depression, hallucinations and suicidal ideas.    MEDICATIONS AT HOME:   APIXABAN (ELIQUIS ORAL) Take 5 mg by mouth 2 (two) times daily.   . carvedilol (COREG) 12.5 MG tablet Take 12.5 mg by mouth nightly.  . cyanocobalamin (VITAMIN B12) 500 MCG tablet Take by mouth.  . FUROsemide (LASIX) 40 MG tablet Take 40 mg by mouth once daily as needed for Edema.  . isosorbide mononitrate (IMDUR) 60 MG ER tablet Take by mouth.  . losartan-hydrochlorothiazide (HYZAAR) 50-12.5 mg tablet Take 1 tablet by mouth once daily.  . simvastatin (ZOCOR) 20 MG tablet Take 1 tablet (20 mg total) by mouth nightly. 30 tablet 5  . tamsulosin (FLOMAX) 0.4 mg capsule TAKE 1 CAPSULE (0.4 MG TOTAL) BY MOUTH ONCE DAILY. TAKE 30 MINUTES AFTER SAME MEAL EACH DAY. 30 capsule 3                                                                                               VITAL SIGNS:  Blood pressure 138/81, pulse 66, temperature 98.3 F (36.8 C), temperature source Oral, resp. rate 20, SpO2 97 %.  PHYSICAL EXAMINATION:   Physical Exam  Constitutional: He is oriented to person, place, and time and well-developed, well-nourished, and in no distress. No  distress.  HENT:  Head: Normocephalic.  Eyes: No scleral icterus.  Neck: Normal range of motion. Neck supple. No JVD present. No tracheal deviation present.  Cardiovascular: Normal rate and regular rhythm.  Exam reveals no gallop and no friction rub.   Murmur heard. Pulmonary/Chest: Effort normal and breath sounds normal. No respiratory distress. He has no wheezes. He has no rales. He exhibits no tenderness.  Abdominal: Soft. Bowel sounds are normal. He exhibits no distension and no mass. There is no tenderness. There is no rebound and no guarding.  Musculoskeletal: Normal range of motion. He exhibits edema (mild).  Neurological: He is alert and oriented to person, place, and time.  Skin: Skin is warm. No rash noted. No erythema.  Psychiatric: Affect and judgment  normal.      LABORATORY PANEL:   CBC  Recent Labs Lab 07/15/16 0557  WBC 5.2  HGB 13.4  HCT 39.1*  PLT 133*   ------------------------------------------------------------------------------------------------------------------  Chemistries   Recent Labs Lab 07/15/16 0557  NA 140  K 4.1  CL 109  CO2 25  GLUCOSE 195*  BUN 27*  CREATININE 1.12  CALCIUM 9.6  AST 33  ALT 19  ALKPHOS 116  BILITOT 1.7*   ------------------------------------------------------------------------------------------------------------------  Cardiac Enzymes  Recent Labs Lab 07/15/16 0557  TROPONINI 0.06*   ------------------------------------------------------------------------------------------------------------------  RADIOLOGY:  Ct Head Wo Contrast  Result Date: 07/15/2016 CLINICAL DATA:  Woke up feeling weak and lightheaded this morning. Recent fever. EXAM: CT HEAD WITHOUT CONTRAST TECHNIQUE: Contiguous axial images were obtained from the base of the skull through the vertex without intravenous contrast. COMPARISON:  04/23/2016 FINDINGS: Brain: There is no intracranial hemorrhage, mass or evidence of acute infarction. There  is moderate generalized atrophy. There is moderate chronic microvascular ischemic change. There is no significant extra-axial fluid collection. No acute intracranial findings are evident. Vascular: No hyperdense vessel or unexpected calcification. Skull: Normal. Negative for fracture or focal lesion. Sinuses/Orbits: No acute finding. Other: None. IMPRESSION: No acute intracranial findings. There is moderate generalized atrophy and chronic appearing white matter hypodensities which likely represent small vessel ischemic disease. Electronically Signed   By: Andreas Newport M.D.   On: 07/15/2016 06:51   Dg Chest Port 1 View  Result Date: 07/15/2016 CLINICAL DATA:  Febrile last night.  Lightheaded this morning. EXAM: PORTABLE CHEST 1 VIEW COMPARISON:  12/28/2014 FINDINGS: Unchanged marked cardiomegaly. Intact appearances of the transvenous cardiac leads. Mild vascular and interstitial prominence. Mild left base airspace opacity. No large effusion. IMPRESSION: Cardiomegaly with vascular and interstitial prominence suggesting a degree of congestive heart failure. Mild left base opacities may represent asymmetric alveolar edema. Infectious infiltrate not entirely excluded. Electronically Signed   By: Andreas Newport M.D.   On: 07/15/2016 06:26    EKG:   Paced rhythm  IMPRESSION AND PLAN:   82 year old male with history of AICD, chronic kidney disease stage III who presents with presyncope and elevated troponin.  1. Elevated troponin: Due to extensive cardiac history patient will need to be evaluated by cardiology. We will continue telemetry monitoring and following troponins.  2. Presyncope: Patient does not appear to be hypovolemic or orthostatic Continue telemetry monitoring  3. CAD/AICD/ischemic cardiomyopathy: Continue Coreg, isosorbide, Zocor,Hyzaar  4. PAF: Continue Eliquis and Coreg for heart rate control.  5. Chronic kidney disease stage III: Creatinine seems to be at baseline 6.  Hyperglycemia: I will check hemoglobin A1c   All the records are reviewed and case discussed with ED provider. Management plans discussed with the patient and wife and they are in agreement  CODE STATUS: full  TOTAL TIME TAKING CARE OF THIS PATIENT: 45 minutes.    Tram Wrenn M.D on 07/15/2016 at 7:04 AM  Between 7am to 6pm - Pager - [phone]  After 6pm go to [email or web address] - password [email or web address] [place] [facility]  Office  [phone]  CC: Primary care physician; [name], MD

## 2016-07-15 NOTE — Progress Notes (Signed)
Admission Done using Interpreter on wheels name sam [id number]

## 2016-07-15 NOTE — Discharge Summary (Signed)
[place] [facility] [place] NAME: Okie Bogacz    MR#:  [id number]  DATE OF BIRTH:  [birth year]  DATE OF ADMISSION:  07/15/2016 ADMITTING PHYSICIAN: Bettey Costa, MD  DATE OF DISCHARGE: 07/15/2016  PRIMARY CARE PHYSICIAN: [name], MD    ADMISSION DIAGNOSIS:  Dizziness [R42] Elevated troponin [R74.8] Generalized weakness [R53.1] Near syncope [R55]  DISCHARGE DIAGNOSIS:  Principal Problem:   Weakness Active Problems:   CAD Status post coronary artery bypass grafting: 1995   Permanent atrial fibrillation [facility])   Biventricular ICD Medtronic Viva single chamber October 0102   Chronic systolic heart failure (HCC)   Bilateral deafness   Demand ischemia (HCC)   Fatigue   Diarrhea   URI (upper respiratory infection)   SECONDARY DIAGNOSIS:   Past Medical History:  Diagnosis Date  . Biventricular ICD (implantable cardioverter-defibrillator) in place 03/24/2005   Implantation of a Medtronic Adapta ADDRO1, serial number [id number]  . CHF (congestive heart failure) ([place])   . CKD (chronic kidney disease), stage III   . Coronary artery disease    a. s/p CABG 1986. b. Multiple PCIs/caths. c. 09/2013: s/p PTCA and BMS to SVG-OM.  [name]   . History of abdominal aortic aneurysm   . History of bleeding peptic ulcer 1980  . History of epididymitis 2013  . HTN (hypertension)   . Hydronephrosis with ureteropelvic junction obstruction   . Hydroureter on left 2009  . Hypertension   . Ischemic cardiomyopathy    a. Prior EF 30-35%, s/p BIV-ICD. b. 09/2013: EF 45-50%.  . Moderate tricuspid regurgitation   . PAF (paroxysmal atrial fibrillation) ([place])   . Prostate cancer ([place])   . Status post coronary artery bypass grafting 1986   LIMA to the LAD, SVG to OM, SVG to RCA  . Testicular swelling     HOSPITAL COURSE:  82 year old male with extensive cardiac history who presented with weakness and fatigue and found to have elevated troponin.  1. Elevated  troponin due to demand ischemia. Symptoms were not concerning for angina. He did not have ACS.  2. Weakness and fatigue with presyncope due to diarrhea from viral illness This is improved.  3. CAD: Patient will continue outpatient regimen  4. Mobitz type 2/diet cardiac myopathy: Patient has no symptoms concerning for arrhythmia. Patient follow up with cardiology   DISCHARGE CONDITIONS AND DIET:   Patient is stable for discharge on heart healthy diet  CONSULTS OBTAINED:  Treatment Team:  Minna Merritts, MD  DRUG ALLERGIES:   Allergies  Allergen Reactions  . Phenazopyridine Nausea Only and Other (See Comments)    Other Reaction: GI UPSET  . Ramipril Other (See Comments)    DISCHARGE MEDICATIONS:   Current Discharge Medication List    CONTINUE these medications which have NOT CHANGED   Details  aspirin EC 81 MG EC tablet Take 1 tablet (81 mg total) by mouth daily. Qty: 30 tablet, Refills: 1    carvedilol (COREG) 12.5 MG tablet TAKE 1 TABLET TWICE A DAY APPT NEEDED FOR REFILLS Qty: 60 tablet, Refills: 4    ELIQUIS 5 MG TABS tablet TAKE 1 TABLET (5 MG TOTAL) BY MOUTH 2 (TWO) TIMES DAILY. Qty: 60 tablet, Refills: 5    furosemide (LASIX) 40 MG tablet Take 1 tablet (40 mg total) by mouth daily.    isosorbide mononitrate (IMDUR) 60 MG 24 hr tablet Take 1.5 tablets (90 mg total) by mouth daily. Qty: 45 tablet, Refills: 5    losartan-hydrochlorothiazide (  HYZAAR) 50-12.5 MG tablet TAKE 1 TABLET BY MOUTH EVERY DAY **NEED OFFICE VISIT** Qty: 30 tablet, Refills: 9    simvastatin (ZOCOR) 20 MG tablet Take 1 tablet (20 mg total) by mouth daily at 6 PM. <PLEASE MAKE APPOINTMENT FOR REFILLS> Qty: 30 tablet, Refills: 0    tamsulosin (FLOMAX) 0.4 MG CAPS capsule Take 0.4 mg by mouth daily. Refills: 3    oxymetazoline (AFRIN) 0.05 % nasal spray Place 2 sprays into both nostrils 2 (two) times daily as needed for congestion. Use as needed for nose bleeds Qty: 15 mL, Refills: 2       STOP taking these medications     albuterol (PROVENTIL HFA;VENTOLIN HFA) 108 (90 Base) MCG/ACT inhaler      vitamin B-12 (CYANOCOBALAMIN) 500 MCG tablet           Today   CHIEF COMPLAINT:     VITAL SIGNS:  Blood pressure 128/72, pulse 64, temperature 97.8 F (36.6 C), resp. rate 16, weight 101.7 kg (224 lb 3.2 oz), SpO2 94 %.   REVIEW OF SYSTEMS:  Review of Systems  Constitutional: Negative.  Negative for chills, fever and malaise/fatigue.  HENT: Negative.  Negative for ear discharge, ear pain, hearing loss, nosebleeds and sore throat.   Eyes: Negative.  Negative for blurred vision and pain.  Respiratory: Negative.  Negative for cough, hemoptysis, shortness of breath and wheezing.   Cardiovascular: Negative.  Negative for chest pain, palpitations and leg swelling.  Gastrointestinal: Negative.  Negative for abdominal pain, blood in stool, diarrhea, nausea and vomiting.  Genitourinary: Negative.  Negative for dysuria.  Musculoskeletal: Negative.  Negative for back pain.  Skin: Negative.   Neurological: Negative for dizziness, tremors, speech change, focal weakness, seizures and headaches.  Endo/Heme/Allergies: Negative.  Does not bruise/bleed easily.  Psychiatric/Behavioral: Negative.  Negative for depression, hallucinations and suicidal ideas.     PHYSICAL EXAMINATION:  GENERAL:  82 y.o.-year-old patient lying in the bed with no acute distress.  Patient with bilateral deafness NECK:  Supple, no jugular venous distention. No thyroid enlargement, no tenderness.  LUNGS: Normal breath sounds bilaterally, no wheezing, rales,rhonchi  No use of accessory muscles of respiration.  CARDIOVASCULAR: S1, S2 normal. No murmurs, rubs, or gallops.  ABDOMEN: Soft, non-tender, non-distended. Bowel sounds present. No organomegaly or mass.  EXTREMITIES: No pedal edema, cyanosis, or clubbing.  PSYCHIATRIC: The patient is alert and oriented x 3.  SKIN: No obvious rash, lesion, or  ulcer.   DATA REVIEW:   CBC  Recent Labs Lab 07/15/16 0557  WBC 5.2  HGB 13.4  HCT 39.1*  PLT 133*    Chemistries   Recent Labs Lab 07/15/16 0557  NA 140  K 4.1  CL 109  CO2 25  GLUCOSE 195*  BUN 27*  CREATININE 1.12  CALCIUM 9.6  AST 33  ALT 19  ALKPHOS 116  BILITOT 1.7*    Cardiac Enzymes  Recent Labs Lab 07/15/16 0557  TROPONINI 0.06*    Microbiology Results  @[email or web address] @  RADIOLOGY:  Ct Head Wo Contrast  Result Date: 07/15/2016 CLINICAL DATA:  Woke up feeling weak and lightheaded this morning. Recent fever. EXAM: CT HEAD WITHOUT CONTRAST TECHNIQUE: Contiguous axial images were obtained from the base of the skull through the vertex without intravenous contrast. COMPARISON:  04/23/2016 FINDINGS: Brain: There is no intracranial hemorrhage, mass or evidence of acute infarction. There is moderate generalized atrophy. There is moderate chronic microvascular ischemic change. There is no significant extra-axial fluid collection. No acute intracranial findings are  evident. Vascular: No hyperdense vessel or unexpected calcification. Skull: Normal. Negative for fracture or focal lesion. Sinuses/Orbits: No acute finding. Other: None. IMPRESSION: No acute intracranial findings. There is moderate generalized atrophy and chronic appearing white matter hypodensities which likely represent small vessel ischemic disease. Electronically Signed   By: Andreas Newport M.D.   On: 07/15/2016 06:51   Dg Chest Port 1 View  Result Date: 07/15/2016 CLINICAL DATA:  Febrile last night.  Lightheaded this morning. EXAM: PORTABLE CHEST 1 VIEW COMPARISON:  12/28/2014 FINDINGS: Unchanged marked cardiomegaly. Intact appearances of the transvenous cardiac leads. Mild vascular and interstitial prominence. Mild left base airspace opacity. No large effusion. IMPRESSION: Cardiomegaly with vascular and interstitial prominence suggesting a degree of congestive heart failure. Mild left base opacities  may represent asymmetric alveolar edema. Infectious infiltrate not entirely excluded. Electronically Signed   By: Andreas Newport M.D.   On: 07/15/2016 06:26      Current Discharge Medication List    CONTINUE these medications which have NOT CHANGED   Details  aspirin EC 81 MG EC tablet Take 1 tablet (81 mg total) by mouth daily. Qty: 30 tablet, Refills: 1    carvedilol (COREG) 12.5 MG tablet TAKE 1 TABLET TWICE A DAY APPT NEEDED FOR REFILLS Qty: 60 tablet, Refills: 4    ELIQUIS 5 MG TABS tablet TAKE 1 TABLET (5 MG TOTAL) BY MOUTH 2 (TWO) TIMES DAILY. Qty: 60 tablet, Refills: 5    furosemide (LASIX) 40 MG tablet Take 1 tablet (40 mg total) by mouth daily.    isosorbide mononitrate (IMDUR) 60 MG 24 hr tablet Take 1.5 tablets (90 mg total) by mouth daily. Qty: 45 tablet, Refills: 5    losartan-hydrochlorothiazide (HYZAAR) 50-12.5 MG tablet TAKE 1 TABLET BY MOUTH EVERY DAY **NEED OFFICE VISIT** Qty: 30 tablet, Refills: 9    simvastatin (ZOCOR) 20 MG tablet Take 1 tablet (20 mg total) by mouth daily at 6 PM. <PLEASE MAKE APPOINTMENT FOR REFILLS> Qty: 30 tablet, Refills: 0    tamsulosin (FLOMAX) 0.4 MG CAPS capsule Take 0.4 mg by mouth daily. Refills: 3    oxymetazoline (AFRIN) 0.05 % nasal spray Place 2 sprays into both nostrils 2 (two) times daily as needed for congestion. Use as needed for nose bleeds Qty: 15 mL, Refills: 2      STOP taking these medications     albuterol (PROVENTIL HFA;VENTOLIN HFA) 108 (90 Base) MCG/ACT inhaler      vitamin B-12 (CYANOCOBALAMIN) 500 MCG tablet          }   Management plans discussed with the patient and he is in agreement. Stable for discharge home  Patient should follow up with pcp  CODE STATUS:     Code Status Orders        Start     Ordered   07/15/16 0929  Full code  Continuous     07/15/16 0929    Code Status History    Date Active Date Inactive Code Status Order ID Comments User Context   10/31/2015  2:44 PM  11/01/2015  7:21 PM Full Code [id number]  Nicholes Mango, MD Inpatient   09/25/2013 11:37 AM 09/26/2013  3:02 PM Full Code [id number]  Sherren Mocha, MD Inpatient   09/21/2013  2:44 PM 09/25/2013 11:37 AM Full Code [id number]  Sherren Mocha, MD Inpatient   12/16/2012  4:48 PM 12/17/2012  4:39 PM Full Code [id number]  [name], MD Inpatient      TOTAL TIME TAKING CARE OF THIS  PATIENT: 37 minutes.    Note: This dictation was prepared with Dragon dictation along with smaller phrase technology. Any transcriptional errors that result from this process are unintentional.  Maela Takeda M.D on 07/15/2016 at 11:07 AM  Between 7am to 6pm - Pager - [phone] After 6pm go to [email or web address] - password [email or web address] [facility]  Office  [phone]  CC: Primary care physician; [name], MD

## 2016-07-15 NOTE — Progress Notes (Addendum)
Patient discharged shortly after admission. I walked the patient around the unit, and patient states they have no dizziness, weakness, or pain. Vitals stable. Second troponin 0.03.  No medications given since admission, and no new medications on discharge instructions. ASL tele interpreter used for admission and medication review. ASL interpreter Danny, [id number], was used for and all discharge instructions.   Tele and IV removed

## 2016-07-15 NOTE — ED Notes (Signed)
Date and time results received: 07/15/16 0641  Test: Troponin Critical Value: 0.06   Name of Provider Notified: Beather Arbour, MD  Orders Received? Or Actions Taken?: MD made aware.

## 2016-07-15 NOTE — ED Notes (Signed)
Pt offered urinal, does not have to urinate at this time.  Will attempt to obtain urine specimen later.

## 2016-07-15 NOTE — Progress Notes (Signed)
Thomas [name] was admitted to the Hospital on 07/15/2016 and Discharged  07/15/2016 and should be excused from work/school   for 2 days starting 07/15/2016 , may return to work/school without any restrictions.  Call Bettey Costa MD with questions.  Danton Palmateer M.D on 07/15/2016,at 11:18 AM  [place] [facility]  [phone]

## 2016-07-15 NOTE — ED Provider Notes (Signed)
[facility] Emergency Department Provider Note   ____________________________________________   First MD Initiated Contact with Patient 07/15/16 [phone]     (approximate)  I have reviewed the triage vital signs and the nursing notes.   HISTORY  Chief Complaint Near Syncope; Fever; and Weakness  History obtained via sign language interpreter  HPI Thomas [name] is a 82 y.o. male brought to the ED from home via EMS with a chief complaint of generalized weakness, dizziness and near syncope. Per spouse, patient was feeling unwell yesterday. He was supposed to be seen by his PCP but there was no interpreter so he was unable to have his appointment. Complains of a one-day history of generalized weakness, dizziness, almost fainting, subjective fever and chest pain. Reports feeling lightheaded and nauseous. Denies headache, vision changes, shortness of breath, vomiting, diarrhea.Denies recent travel or trauma. Nothing makes his symptoms better or worse.   Past Medical History:  Diagnosis Date  . Biventricular ICD (implantable cardioverter-defibrillator) in place 03/24/2005   Implantation of a Medtronic Adapta ADDRO1, serial number [id number]  . CHF (congestive heart failure) ([place])   . CKD (chronic kidney disease), stage III   . Coronary artery disease    a. s/p CABG 1986. b. Multiple PCIs/caths. c. 09/2013: s/p PTCA and BMS to SVG-OM.  [name]   . History of abdominal aortic aneurysm   . History of bleeding peptic ulcer 1980  . History of epididymitis 2013  . HTN (hypertension)   . Hydronephrosis with ureteropelvic junction obstruction   . Hydroureter on left 2009  . Hypertension   . Ischemic cardiomyopathy    a. Prior EF 30-35%, s/p BIV-ICD. b. 09/2013: EF 45-50%.  . Moderate tricuspid regurgitation   . PAF (paroxysmal atrial fibrillation) ([place])   . Prostate cancer ([place])   . Status post coronary artery bypass grafting 1986   LIMA to the LAD, SVG to OM, SVG to  RCA  . Testicular swelling     Patient Active Problem List   Diagnosis Date Noted  . CVA (cerebral infarction) 10/31/2015  . Bulbous urethral stricture 09/18/2015  . Bilateral deafness 08/19/2015  . Bilateral cataracts 08/19/2015  . Acid reflux 08/19/2015  . HLD (hyperlipidemia) 08/19/2015  . BP (high blood pressure) 08/19/2015  . Myocardial infarction ([place]) 08/19/2015  . Calculus of kidney 08/19/2015  . Artificial cardiac pacemaker 08/19/2015  . Gastroduodenal ulcer 08/19/2015  . Dupuytren's contracture of foot 08/19/2015  . Malignant neoplasm of prostate ([place]) 08/19/2015  . Microhematuria 08/19/2015  . Coronary artery disease 02/22/2015  . Status post coronary artery bypass grafting 02/22/2015  . Benign essential HTN 04/02/2014  . Hematochezia 02/20/2014  . Moderate tricuspid regurgitation 02/20/2014  . Mobitz type II atrioventricular block 12/12/2013  . Long term current use of anticoagulant 10/18/2013  . Acute myocardial infarction, subendocardial infarction, initial episode of care ([place]) 09/21/2013  . Cardiomyopathy, ischemic: Ejection fraction 45% 11/11/2012  . CAD Status post coronary artery bypass grafting: 1995 11/11/2012  . Permanent atrial fibrillation ([place]) 11/11/2012  . Biventricular ICD Medtronic Viva single chamber October 2014 11/11/2012  . Chronic systolic heart failure ([place]) 11/11/2012    Past Surgical History:  Procedure Laterality Date  . 2-D echocardiogram  11/20/2011   Ejection fraction 30-35% moderate concentric left ventricular hypertrophy. Left atrium is moderately dilated. Mild MR. Mild or  . BI-VENTRICULAR IMPLANTABLE CARDIOVERTER DEFIBRILLATOR N/A 12/16/2012   Procedure: BI-VENTRICULAR IMPLANTABLE CARDIOVERTER DEFIBRILLATOR  (CRT-D);  Surgeon: [name], MD;  Location: [facility] CATH LAB;  Service: Cardiovascular;  Laterality: N/A;  . CARDIAC CATHETERIZATION  12/10/2011   SVG to OM widely patent.  LIMA to LAD patent  . CORONARY ARTERY BYPASS GRAFT   1986  . INSERT / REPLACE / REMOVE PACEMAKER    . LEFT HEART CATHETERIZATION WITH CORONARY/GRAFT ANGIOGRAM N/A 12/10/2011   Procedure: LEFT HEART CATHETERIZATION WITH Beatrix Fetters;  Surgeon: Sanda Klein, MD;  Location: [facility] CATH LAB;  Service: Cardiovascular;  Laterality: N/A;  . LEFT HEART CATHETERIZATION WITH CORONARY/GRAFT ANGIOGRAM N/A 09/25/2013   Procedure: LEFT HEART CATHETERIZATION WITH Beatrix Fetters;  Surgeon: Blane Ohara, MD;  Location: [facility] CATH LAB;  Service: Cardiovascular;  Laterality: N/A;  . Persantine Myoview  05/06/2010   Post-rest ejection fraction 30%. No significant ischemia demonstrated. Compared to previous study there is no significant change.  . TRANSURETHRAL RESECTION OF PROSTATE     s/p    Prior to Admission medications   Medication Sig Start Date End Date Taking? Authorizing Provider  albuterol (PROVENTIL HFA;VENTOLIN HFA) 108 (90 Base) MCG/ACT inhaler Inhale 2 puffs into the lungs every 6 (six) hours as needed for wheezing or shortness of breath. 04/02/15   Mihai Croitoru, MD  aspirin EC 81 MG EC tablet Take 1 tablet (81 mg total) by mouth daily. 11/01/15   Fritzi Mandes, MD  carvedilol (COREG) 12.5 MG tablet TAKE 1 TABLET TWICE A DAY APPT NEEDED FOR REFILLS 12/06/15   Mihai Croitoru, MD  ELIQUIS 5 MG TABS tablet TAKE 1 TABLET (5 MG TOTAL) BY MOUTH 2 (TWO) TIMES DAILY. 05/14/16   Mihai Croitoru, MD  furosemide (LASIX) 40 MG tablet Take 1 tablet (40 mg total) by mouth daily. 09/27/13   Dayna N Dunn, PA-C  isosorbide mononitrate (IMDUR) 60 MG 24 hr tablet Take 1.5 tablets (90 mg total) by mouth daily. 06/03/16   Mihai Croitoru, MD  losartan-hydrochlorothiazide (HYZAAR) 50-12.5 MG tablet TAKE 1 TABLET BY MOUTH EVERY DAY **NEED OFFICE VISIT** 07/02/16   [name], MD  oxymetazoline (AFRIN) 0.05 % nasal spray Place 2 sprays into both nostrils 2 (two) times daily as needed for congestion. Use as needed for nose bleeds 08/24/14   Earleen Newport, MD    simvastatin (ZOCOR) 20 MG tablet Take 1 tablet (20 mg total) by mouth daily at 6 PM. <PLEASE MAKE APPOINTMENT FOR REFILLS> 05/18/16   Mihai Croitoru, MD  tamsulosin (FLOMAX) 0.4 MG CAPS capsule Take 0.4 mg by mouth daily. 10/24/15   Historical Provider, MD  vitamin B-12 (CYANOCOBALAMIN) 500 MCG tablet Take 500 mcg by mouth daily. Reported on 08/19/2015    Historical Provider, MD    Allergies Phenazopyridine and Ramipril  No family history on file.  Social History Social History  Substance Use Topics  . Smoking status: Former Smoker    Quit date: 03/15/1985  . Smokeless tobacco: Never Used  . Alcohol use No     Comment: occas.    Review of Systems  Constitutional: Positive for subjective fever/chills. Positive for generalized weakness. Eyes: No visual changes. ENT: No sore throat. Cardiovascular: Positive for chest pain. Respiratory: Denies shortness of breath. Gastrointestinal: No abdominal pain.  Positive for nausea, no vomiting.  No diarrhea.  No constipation. Genitourinary: Negative for dysuria. Musculoskeletal: Negative for back pain. Skin: Negative for rash. Neurological: Negative for headaches, focal weakness or numbness.   ____________________________________________   PHYSICAL EXAM:  VITAL SIGNS: ED Triage Vitals  Enc Vitals Group     BP 07/15/16 0546 138/81     Pulse Rate 07/15/16 0546 66  Resp 07/15/16 0546 20     Temp 07/15/16 0546 98.3 F (36.8 C)     Temp Source 07/15/16 0546 Oral     SpO2 07/15/16 0546 97 %     Weight --      Height --      Head Circumference --      Peak Flow --      Pain Score 07/15/16 0601 6     Pain Loc --      Pain Edu? --      Excl. in [place]? --     Constitutional: Alert and oriented. Well appearing and in no acute distress. Eyes: Conjunctivae are normal. PERRL. EOMI. Head: Atraumatic. Nose: No congestion/rhinnorhea. Mouth/Throat: Mucous membranes are moist.  Oropharynx non-erythematous. Neck: No stridor.  No carotid  bruits. Cardiovascular: Normal rate, regular rhythm. II/VI SEM.  Good peripheral circulation. Respiratory: Normal respiratory effort.  No retractions. Lungs CTAB. Gastrointestinal: Soft and nontender. No distention. No abdominal bruits. No CVA tenderness. Musculoskeletal: No lower extremity tenderness nor edema.  No joint effusions. Neurologic:  Answers questions appropriately. CN II-XII grossly intact. No gross focal neurologic deficits are appreciated. MAEx4. Skin:  Skin is warm, dry and intact. No rash noted. Psychiatric: Mood and affect are normal. Speech and behavior are normal.  ____________________________________________   LABS (all labs ordered are listed, but only abnormal results are displayed)  Labs Reviewed  TROPONIN I - Abnormal; Notable for the following:       Result Value   Troponin I 0.06 (*)    All other components within normal limits  CBC WITH DIFFERENTIAL/PLATELET - Abnormal; Notable for the following:    RBC 4.18 (*)    HCT 39.1 (*)    RDW 15.2 (*)    Platelets 133 (*)    Lymphs Abs 0.9 (*)    All other components within normal limits  COMPREHENSIVE METABOLIC PANEL - Abnormal; Notable for the following:    Glucose, Bld 195 (*)    BUN 27 (*)    Total Bilirubin 1.7 (*)    All other components within normal limits  URINALYSIS, COMPLETE (UACMP) WITH MICROSCOPIC  LACTIC ACID, PLASMA  LACTIC ACID, PLASMA   ____________________________________________  EKG  ED ECG REPORT I, Khadir Roam J, the attending physician, personally viewed and interpreted this ECG.   Date: 07/15/2016  EKG Time: 0554  Rate: 64  Rhythm: Paced rhythm  Axis: Paced  Intervals:Paced  ST&T Change: Paced  ____________________________________________  RADIOLOGY  Portable chest x-ray interpreted per Dr. Alroy Dust: Cardiomegaly with vascular and interstitial prominence suggesting a  degree of congestive heart failure. Mild left base opacities may  represent asymmetric alveolar edema.  Infectious infiltrate not  entirely excluded.   CT head without contrast interpreted per Dr. Alroy Dust:   No acute intracranial findings. There is moderate generalized  atrophy and chronic appearing white matter hypodensities which  likely represent small vessel ischemic disease.   ____________________________________________   PROCEDURES  Procedure(s) performed: None  Procedures  Critical Care performed: No  ____________________________________________   INITIAL IMPRESSION / ASSESSMENT AND PLAN / ED COURSE  Pertinent labs & imaging results that were available during my care of the patient were reviewed by me and considered in my medical decision making (see chart for details).  82 year old male with complex medical history including ischemic cardiomyopathy, paroxysmal atrial fibrillation on Eliquis, status post pacer/ICD, CHF who presents with generalized weakness, near syncope and chest pain. Will obtain screening lab work including troponin, CT head. Will give judicious fluid  bolus, Zofran for nausea and reassess.  Clinical Course as of Jul 16 651  Wed Jul 15, 2016  0651 Updated patient and spouse of laboratory and imaging results. Discussed with hospitalist to evaluate patient in the emergency department for admission.  [JS]    Clinical Course User Index [JS] Paulette Blanch, MD     ____________________________________________   FINAL CLINICAL IMPRESSION(S) / ED DIAGNOSES  Final diagnoses:  Generalized weakness  Near syncope  Dizziness  Elevated troponin      NEW MEDICATIONS STARTED DURING THIS VISIT:  New Prescriptions   No medications on file     Note:  This document was prepared using Dragon voice recognition software and may include unintentional dictation errors.    Paulette Blanch, MD 07/15/16 [phone]

## 2016-07-15 NOTE — Consult Note (Signed)
Cardiology Consultation Note  Patient ID: Thomas [name], MRN: [id number], DOB/AGE: 80/05/1934 80 y.o. Admit date: 07/15/2016   Date of Consult: 07/15/2016 Primary Physician: [name], MD Primary Cardiologist: Dr. Sallyanne Kuster, MD Requesting Physician: Dr. Benjie Karvonen, MD  Chief Complaint: Weakness Reason for Consult: Elevated troponin   Visit completed with ASL interpreter.   HPI: Thomas [name] is a 82 y.o. male who is being seen today for the evaluation of elevated troponin at the request of Dr. Benjie Karvonen, MD. Patient has a h/o CAD s/p 3-vessel CABG with previous stnt to SVG-OM for NSTEMI in 2015, ICM/dilated CM s/p ICD in 2014, persistent Afib on Eliquis, bilateral deafness, HTN, HLD, prior stroke and TIA, generalized weakness, and mechanical falls who presented to [facility] for a "health check" in the setting of diarrhea, URI with fever and generalized weakness.   He had bypass surgery in 1995. Left ventricle systolic function deteriorated in 2014 without options for revascularization by cardiac catheterization and he underwent implantation of his CRT-D in October 2014. He had substantial clinical improvement and left ventricular EF also improved to 45-50%. In July 2015 he had an acute non-STEMI with placement of a stent to the SVG to OM (bare-metal 3.512 mm MultiLink vision). Native coronary arteries were occluded, LIMA to LAD was patent with a 50-70% stenosis at the anastomosis and chronic total occlusion of SVG-RCA.  Admitted 10/2015 for TIA. Echo as below. Carotid ultrasound with nonobstructive disease, CT head without acute process with old lacunar infarct. Unable to have MRI 2/2 ICD. Had been complaint with Elqiuis. Has been seen in the ED for mechanical falls and generalized weakness.  Has had diarrhea for the past 3 days beginning on 4/29 and continuing today. There has also been associated subjective fevers and cough. Patient was sitting on a stool at home on 5/1 and his wife startled him. He got up,  felt weak and she brought him to the hospital for a "health check." No associated chest pain, SOB, palpitations, nausea, vomiting, dizziness, or syncope.   Orthostatic vitals signs negative. Troponin peaked at 0.06 at this time. SCr 1.12, K+ 4.1, WBC 5.2, HGb 13.4, no GI panel. CXR with possible infiltrate. Head CT not acute. EKG V paced.     Past Medical History:  Diagnosis Date  . Biventricular ICD (implantable cardioverter-defibrillator) in place 03/24/2005   Implantation of a Medtronic Adapta ADDRO1, serial number [id number]  . CHF (congestive heart failure) ([place])   . CKD (chronic kidney disease), stage III   . Coronary artery disease    a. s/p CABG 1986. b. Multiple PCIs/caths. c. 09/2013: s/p PTCA and BMS to SVG-OM.  [name]   . History of abdominal aortic aneurysm   . History of bleeding peptic ulcer 1980  . History of epididymitis 2013  . HTN (hypertension)   . Hydronephrosis with ureteropelvic junction obstruction   . Hydroureter on left 2009  . Hypertension   . Ischemic cardiomyopathy    a. Prior EF 30-35%, s/p BIV-ICD. b. 09/2013: EF 45-50%.  . Moderate tricuspid regurgitation   . PAF (paroxysmal atrial fibrillation) ([place])   . Prostate cancer ([place])   . Status post coronary artery bypass grafting 1986   LIMA to the LAD, SVG to OM, SVG to RCA  . Testicular swelling       Most Recent Cardiac Studies: As above   Surgical History:  Past Surgical History:  Procedure Laterality Date  . 2-D echocardiogram  11/20/2011   Ejection fraction 30-35% moderate concentric  left ventricular hypertrophy. Left atrium is moderately dilated. Mild MR. Mild or  . BI-VENTRICULAR IMPLANTABLE CARDIOVERTER DEFIBRILLATOR N/A 12/16/2012   Procedure: BI-VENTRICULAR IMPLANTABLE CARDIOVERTER DEFIBRILLATOR  (CRT-D);  Surgeon: [name], MD;  Location: [facility] CATH LAB;  Service: Cardiovascular;  Laterality: N/A;  . CARDIAC CATHETERIZATION  12/10/2011   SVG to OM widely patent.  LIMA to LAD patent  .  CORONARY ARTERY BYPASS GRAFT  1986  . INSERT / REPLACE / REMOVE PACEMAKER    . LEFT HEART CATHETERIZATION WITH CORONARY/GRAFT ANGIOGRAM N/A 12/10/2011   Procedure: LEFT HEART CATHETERIZATION WITH Beatrix Fetters;  Surgeon: Sanda Klein, MD;  Location: [facility] CATH LAB;  Service: Cardiovascular;  Laterality: N/A;  . LEFT HEART CATHETERIZATION WITH CORONARY/GRAFT ANGIOGRAM N/A 09/25/2013   Procedure: LEFT HEART CATHETERIZATION WITH Beatrix Fetters;  Surgeon: Blane Ohara, MD;  Location: [facility] CATH LAB;  Service: Cardiovascular;  Laterality: N/A;  . Persantine Myoview  05/06/2010   Post-rest ejection fraction 30%. No significant ischemia demonstrated. Compared to previous study there is no significant change.  . TRANSURETHRAL RESECTION OF PROSTATE     s/p     Home Meds: Prior to Admission medications   Medication Sig Start Date End Date Taking? Authorizing Provider  aspirin EC 81 MG EC tablet Take 1 tablet (81 mg total) by mouth daily. 11/01/15  Yes Fritzi Mandes, MD  carvedilol (COREG) 12.5 MG tablet TAKE 1 TABLET TWICE A DAY APPT NEEDED FOR REFILLS 12/06/15  Yes Mihai Croitoru, MD  ELIQUIS 5 MG TABS tablet TAKE 1 TABLET (5 MG TOTAL) BY MOUTH 2 (TWO) TIMES DAILY. 05/14/16  Yes Mihai Croitoru, MD  furosemide (LASIX) 40 MG tablet Take 1 tablet (40 mg total) by mouth daily. 09/27/13  Yes Dayna N Samella Lucchetti, PA-C  isosorbide mononitrate (IMDUR) 60 MG 24 hr tablet Take 1.5 tablets (90 mg total) by mouth daily. 06/03/16  Yes Mihai Croitoru, MD  losartan-hydrochlorothiazide (HYZAAR) 50-12.5 MG tablet TAKE 1 TABLET BY MOUTH EVERY DAY **NEED OFFICE VISIT** 07/02/16  Yes [name], MD  simvastatin (ZOCOR) 20 MG tablet Take 1 tablet (20 mg total) by mouth daily at 6 PM. <PLEASE MAKE APPOINTMENT FOR REFILLS> 05/18/16  Yes Mihai Croitoru, MD  tamsulosin (FLOMAX) 0.4 MG CAPS capsule Take 0.4 mg by mouth daily. 10/24/15  Yes Historical Provider, MD  albuterol (PROVENTIL HFA;VENTOLIN HFA) 108 (90 Base) MCG/ACT  inhaler Inhale 2 puffs into the lungs every 6 (six) hours as needed for wheezing or shortness of breath. Patient not taking: Reported on 07/15/2016 04/02/15   [name] Gobble Croitoru, MD  oxymetazoline (AFRIN) 0.05 % nasal spray Place 2 sprays into both nostrils 2 (two) times daily as needed for congestion. Use as needed for nose bleeds Patient not taking: Reported on 07/15/2016 08/24/14   Earleen Newport, MD    Inpatient Medications:  . apixaban  5 mg Oral BID  . carvedilol  3.125 mg Oral BID WC  . furosemide  40 mg Oral Daily  . losartan  50 mg Oral Daily   And  . hydrochlorothiazide  12.5 mg Oral Daily  . insulin aspart  0-5 Units Subcutaneous QHS  . insulin aspart  0-9 Units Subcutaneous TID WC  . isosorbide mononitrate  90 mg Oral Daily  . simvastatin  20 mg Oral q1800  . tamsulosin  0.4 mg Oral Daily  . vitamin B-12  500 mcg Oral Daily     Allergies:  Allergies  Allergen Reactions  . Phenazopyridine Nausea Only and Other (See Comments)  Other Reaction: GI UPSET  . Ramipril Other (See Comments)    Social History   Social History  . Marital status: Married    Spouse name: N/A  . Number of children: N/A  . Years of education: N/A   Occupational History  . Not on file.   Social History Main Topics  . Smoking status: Former Smoker    Quit date: 03/15/1985  . Smokeless tobacco: Never Used  . Alcohol use No     Comment: occas.  . Drug use: No  . Sexual activity: Not Currently   Other Topics Concern  . Not on file   Social History Narrative  . No narrative on file     Family History  Problem Relation Age of Onset  . Hypertension Father      Review of Systems: Review of Systems  Constitutional: Positive for chills, fever and malaise/fatigue. Negative for diaphoresis and weight loss.  HENT: Negative for congestion.   Eyes: Negative for discharge and redness.  Respiratory: Positive for cough. Negative for hemoptysis, sputum production, shortness of breath and  wheezing.   Cardiovascular: Negative for chest pain, palpitations, orthopnea, claudication, leg swelling and PND.  Gastrointestinal: Negative for abdominal pain, blood in stool, heartburn, melena, nausea and vomiting.  Genitourinary: Negative for hematuria.  Musculoskeletal: Negative for falls and myalgias.  Skin: Negative for rash.  Neurological: Positive for weakness. Negative for dizziness, tingling, tremors, sensory change, speech change, focal weakness and loss of consciousness.  Endo/Heme/Allergies: Does not bruise/bleed easily.  Psychiatric/Behavioral: Negative for substance abuse. The patient is nervous/anxious.   All other systems reviewed and are negative.   Labs:  Recent Labs  07/15/16 0557  TROPONINI 0.06*   Lab Results  Component Value Date   WBC 5.2 07/15/2016   HGB 13.4 07/15/2016   HCT 39.1 (L) 07/15/2016   MCV 93.5 07/15/2016   PLT 133 (L) 07/15/2016     Recent Labs Lab 07/15/16 0557  NA 140  K 4.1  CL 109  CO2 25  BUN 27*  CREATININE 1.12  CALCIUM 9.6  PROT 7.1  BILITOT 1.7*  ALKPHOS 116  ALT 19  AST 33  GLUCOSE 195*   Lab Results  Component Value Date   CHOL 92 11/01/2015   HDL 31 (L) 11/01/2015   LDLCALC 46 11/01/2015   TRIG 76 11/01/2015   No results found for: DDIMER  Radiology/Studies:  Ct Head Wo Contrast  Result Date: 07/15/2016 CLINICAL DATA:  Woke up feeling weak and lightheaded this morning. Recent fever. EXAM: CT HEAD WITHOUT CONTRAST TECHNIQUE: Contiguous axial images were obtained from the base of the skull through the vertex without intravenous contrast. COMPARISON:  04/23/2016 FINDINGS: Brain: There is no intracranial hemorrhage, mass or evidence of acute infarction. There is moderate generalized atrophy. There is moderate chronic microvascular ischemic change. There is no significant extra-axial fluid collection. No acute intracranial findings are evident. Vascular: No hyperdense vessel or unexpected calcification. Skull:  Normal. Negative for fracture or focal lesion. Sinuses/Orbits: No acute finding. Other: None. IMPRESSION: No acute intracranial findings. There is moderate generalized atrophy and chronic appearing white matter hypodensities which likely represent small vessel ischemic disease. Electronically Signed   By: Andreas Newport M.D.   On: 07/15/2016 06:51   Dg Chest Port 1 View  Result Date: 07/15/2016 CLINICAL DATA:  Febrile last night.  Lightheaded this morning. EXAM: PORTABLE CHEST 1 VIEW COMPARISON:  12/28/2014 FINDINGS: Unchanged marked cardiomegaly. Intact appearances of the transvenous cardiac leads. Mild vascular and interstitial  prominence. Mild left base airspace opacity. No large effusion. IMPRESSION: Cardiomegaly with vascular and interstitial prominence suggesting a degree of congestive heart failure. Mild left base opacities may represent asymmetric alveolar edema. Infectious infiltrate not entirely excluded. Electronically Signed   By: Andreas Newport M.D.   On: 07/15/2016 06:26    EKG: Interpreted by me showed: V paced, 64 bpm Telemetry: Interpreted by me showed: V paced  Weights: Filed Weights   07/15/16 0900  Weight: 224 lb 3.2 oz (101.7 kg)     Physical Exam: Blood pressure 128/72, pulse 64, temperature 97.8 F (36.6 C), resp. rate 16, weight 224 lb 3.2 oz (101.7 kg), SpO2 94 %. Body mass index is 30.41 kg/m. General: Well developed, well nourished, in no acute distress. Deaf.  Head: Normocephalic, atraumatic, sclera non-icteric, no xanthomas, nares are without discharge.  Neck: Negative for carotid bruits. JVD not elevated. Lungs: Clear bilaterally to auscultation without wheezes, rales, or rhonchi. Breathing is unlabored. Heart: RRR with S1 S2. No murmurs, rubs, or gallops appreciated. Abdomen: Soft, non-tender, non-distended with normoactive bowel sounds. No hepatomegaly. No rebound/guarding. No obvious abdominal masses. Msk:  Strength and tone appear normal for  age. Extremities: No clubbing or cyanosis. No edema. Distal pedal pulses are 2+ and equal bilaterally. Neuro: Alert and oriented X 3. No facial asymmetry. No focal deficit. Moves all extremities spontaneously. Psych:  Responds to questions appropriately with a normal affect.    Assessment and Plan:  Principal Problem:   Weakness Active Problems:   Fatigue   Diarrhea   URI (upper respiratory infection)   CAD Status post coronary artery bypass grafting: 1995   Permanent atrial fibrillation [facility])   Biventricular ICD Medtronic Viva single chamber October 7591   Chronic systolic heart failure ([place])   Bilateral deafness   Demand ischemia ([place])    1. Weakness/fatigue/malaise: -Patient's primary issue -Has had diarrhea on 4/29 and 5/1 into 5/2 -Also with cough and subjective fever -Decreased PO intake including solids and liquids -Likely viral bug, consider GI panel, defer to IM -Has had several falls, feels weak when walking -Notes difficutly lifting heavy objects at work, wants a note -Walk with PT, may need HHPT -Multiple visits for weakness/mechanical falls  2. Demand ischemia: -No symptoms concerning for angina -Likely demand ischemia in the setting of his decreased PO intake, diarrhea, and URI -No plans for inpatient ischemic evaluation  3. CAD as above: -As above -Continue Eliquis in place of ASA, Coreg, simvastatin, losartan, Imdur  4. Mobitz type II/ICM/dilated cardiomyopathy: -No symptoms concerning for arrhythmia -No prior malignant ventricular arrhythmia -Device appears to be functioning well -Outpatient follow up  5. Bilateral deafness: -Office visit completed ASL interpreter   6. Permanent Afib: -Paced -Coreg -Eliquis  -CHADS2VASc at least 8 (CHF, HTN, age x 2, DM, stroke x 2, vascular disease)  7. URI/possible infiltrate: -Patient with subjective fever and coughing -Per IM   Signed, Christell Faith, PA-C [facility] Pager: [phone] 07/15/2016,  11:03 AM

## 2016-07-15 NOTE — ED Triage Notes (Signed)
Pt arrived via ems. Pt deaf and unable to read; using ASL interpreter on ipad for triage. Pt reported waking up this morning and feeling weak and dizzy. Pt also reports having a fever but did not check the temperature grade. Pt reports "a little bit" when asked if he was having chest pain. Pt states feeling nauseated but denies vomiting.

## 2016-07-15 NOTE — Care Management Obs Status (Signed)
[facility] NOTIFICATION   Patient Details  Name: Thomas [name] MRN: [id number] Date of Birth: 01-29-1935   Medicare Observation Status Notification Given:  No < 24 hours   Katrina Stack, RN 07/15/2016, 12:18 PM

## 2016-07-16 LAB — HEMOGLOBIN A1C
HEMOGLOBIN A1C: 5.7 % — AB (ref 4.8–5.6)
Mean Plasma Glucose: 117 mg/dL

## 2016-07-20 ENCOUNTER — Ambulatory Visit (INDEPENDENT_AMBULATORY_CARE_PROVIDER_SITE_OTHER): Payer: Managed Care, Other (non HMO) | Admitting: *Deleted

## 2016-07-20 DIAGNOSIS — I255 Ischemic cardiomyopathy: Secondary | ICD-10-CM | POA: Diagnosis not present

## 2016-07-20 DIAGNOSIS — I5022 Chronic systolic (congestive) heart failure: Secondary | ICD-10-CM

## 2016-07-21 LAB — CUP PACEART REMOTE DEVICE CHECK
Battery Remaining Longevity: 32 mo
Battery Voltage: 2.95 V
Brady Statistic AP VP Percent: 0 %
Brady Statistic AS VS Percent: 4.18 %
Brady Statistic RA Percent Paced: 0 %
Date Time Interrogation Session: 20180507103626
HIGH POWER IMPEDANCE MEASURED VALUE: 63 Ohm
Implantable Lead Implant Date: 20000210
Implantable Lead Implant Date: 20000210
Implantable Lead Location: 753859
Implantable Lead Location: 753860
Implantable Lead Model: 4244
Implantable Lead Model: 4298
Implantable Lead Serial Number: 272469
Implantable Lead Serial Number: 413633
Lead Channel Impedance Value: 285 Ohm
Lead Channel Impedance Value: 380 Ohm
Lead Channel Impedance Value: 380 Ohm
Lead Channel Impedance Value: 399 Ohm
Lead Channel Impedance Value: 437 Ohm
Lead Channel Impedance Value: 608 Ohm
Lead Channel Impedance Value: 608 Ohm
Lead Channel Impedance Value: 608 Ohm
Lead Channel Impedance Value: 608 Ohm
Lead Channel Pacing Threshold Amplitude: 1.125 V
Lead Channel Pacing Threshold Pulse Width: 0.4 ms
Lead Channel Sensing Intrinsic Amplitude: 0.875 mV
Lead Channel Sensing Intrinsic Amplitude: 4.5 mV
Lead Channel Setting Pacing Pulse Width: 0.4 ms
Lead Channel Setting Pacing Pulse Width: 0.6 ms
Lead Channel Setting Sensing Sensitivity: 0.3 mV
MDC IDC LEAD IMPLANT DT: 20141003
MDC IDC LEAD LOCATION: 753858
MDC IDC MSMT LEADCHNL LV IMPEDANCE VALUE: 342 Ohm
MDC IDC MSMT LEADCHNL LV IMPEDANCE VALUE: 380 Ohm
MDC IDC MSMT LEADCHNL LV IMPEDANCE VALUE: 380 Ohm
MDC IDC MSMT LEADCHNL LV IMPEDANCE VALUE: 665 Ohm
MDC IDC MSMT LEADCHNL LV PACING THRESHOLD PULSEWIDTH: 0.6 ms
MDC IDC MSMT LEADCHNL RA SENSING INTR AMPL: 0.875 mV
MDC IDC MSMT LEADCHNL RV PACING THRESHOLD AMPLITUDE: 0.75 V
MDC IDC MSMT LEADCHNL RV SENSING INTR AMPL: 4.5 mV
MDC IDC PG IMPLANT DT: 20141003
MDC IDC SET LEADCHNL LV PACING AMPLITUDE: 2.25 V
MDC IDC SET LEADCHNL RV PACING AMPLITUDE: 2 V
MDC IDC STAT BRADY AP VS PERCENT: 0 %
MDC IDC STAT BRADY AS VP PERCENT: 95.82 %
MDC IDC STAT BRADY RV PERCENT PACED: 97.9 %

## 2016-07-21 NOTE — Progress Notes (Signed)
Remote ICD transmission.   

## 2016-07-24 ENCOUNTER — Encounter: Payer: Self-pay | Admitting: Cardiology

## 2016-07-24 NOTE — Progress Notes (Signed)
Letter  

## 2016-08-03 ENCOUNTER — Telehealth: Payer: Self-pay | Admitting: Internal Medicine

## 2016-08-03 NOTE — Telephone Encounter (Signed)
New message      Patient calling the office for samples of medication:   1.  What medication and dosage are you requesting samples for? eliquis 5mg  2.  Are you currently out of this medication? Pt has 3 pills left.  Pt want to come by thurs am to get samples.  Their phone is not working.  OK to call son and leave message on vm regarding samples

## 2016-08-04 NOTE — Telephone Encounter (Signed)
Called, left msg for pt's son, Sherrie Sport. Informed 2 weeks worth of Eliquis samples are ready to be picked up at our office. Informed pt assistance form is enclosed with instructions. Informed to please call our office with questions or concerns.

## 2016-08-12 NOTE — Telephone Encounter (Signed)
F/u call:  Patient calling to see if Eliquis samples are still available. Thanks.

## 2016-08-14 NOTE — Telephone Encounter (Signed)
Called pt and left message for pt to call back concerning his medication of Eliquis 5 mg tablet samples. I needed to ask the pt if he has returned his assistance forms.

## 2016-08-17 ENCOUNTER — Ambulatory Visit (INDEPENDENT_AMBULATORY_CARE_PROVIDER_SITE_OTHER): Payer: Managed Care, Other (non HMO) | Admitting: Cardiovascular Disease

## 2016-08-17 ENCOUNTER — Other Ambulatory Visit: Payer: Self-pay | Admitting: *Deleted

## 2016-08-17 VITALS — Ht 72.0 in

## 2016-08-17 DIAGNOSIS — I10 Essential (primary) hypertension: Secondary | ICD-10-CM | POA: Diagnosis not present

## 2016-08-17 DIAGNOSIS — I25708 Atherosclerosis of coronary artery bypass graft(s), unspecified, with other forms of angina pectoris: Secondary | ICD-10-CM | POA: Diagnosis not present

## 2016-08-17 DIAGNOSIS — E7800 Pure hypercholesterolemia, unspecified: Secondary | ICD-10-CM

## 2016-08-17 DIAGNOSIS — I5022 Chronic systolic (congestive) heart failure: Secondary | ICD-10-CM | POA: Diagnosis not present

## 2016-08-17 DIAGNOSIS — I441 Atrioventricular block, second degree: Secondary | ICD-10-CM | POA: Diagnosis not present

## 2016-08-17 DIAGNOSIS — Z95810 Presence of automatic (implantable) cardiac defibrillator: Secondary | ICD-10-CM

## 2016-08-17 DIAGNOSIS — I071 Rheumatic tricuspid insufficiency: Secondary | ICD-10-CM | POA: Diagnosis not present

## 2016-08-17 DIAGNOSIS — I4821 Permanent atrial fibrillation: Secondary | ICD-10-CM

## 2016-08-17 DIAGNOSIS — I482 Chronic atrial fibrillation: Secondary | ICD-10-CM

## 2016-08-17 DIAGNOSIS — I255 Ischemic cardiomyopathy: Secondary | ICD-10-CM

## 2016-08-17 DIAGNOSIS — I209 Angina pectoris, unspecified: Secondary | ICD-10-CM

## 2016-08-17 MED ORDER — POTASSIUM CHLORIDE ER 20 MEQ PO TBCR: 20.0000 meq | EXTENDED_RELEASE_TABLET | Freq: Every day | ORAL | 3 refills | Status: DC

## 2016-08-17 MED ORDER — FUROSEMIDE 40 MG PO TABS: 40.0000 mg | ORAL_TABLET | Freq: Every day | ORAL | 3 refills | Status: DC

## 2016-08-17 NOTE — Progress Notes (Signed)
Patient received as a walk in with wife requesting samples of Eliquis and stating that Dr. Kary Kos saw patient his AM and was concerned about his swelling in his legs therefore sent them here "urgently" to our office to see Dr. Sallyanne Kuster.  No current appt.  Wife became upset that we were unaware of patient coming to our office and that he needed to be seen as they are very concerned.   Escorted patient and wife back to room with video interpretation services to assist.   Patient reports increased swelling to BL LE.  Reports "a little" SOB.  Denies increase in salt intake.  Reports weight 231 lbs this AM.  Patient reports taking furosemide 40mg  AS NEEDED, last dose yesterday, reports this helped but swelling came back.  Dr. Sallyanne Kuster came to bedside. Advised to take furosemide 40mg  daily and K 61meq daily, follow up in 1 week with blood work.   Dr. Sallyanne Kuster spoke to patient and wife about salt restriction.  Patient will weigh daily and bring report to follow up.

## 2016-08-19 NOTE — Progress Notes (Signed)
Cardiology Office Note    Date:  08/20/2016   ID:  Thomas [name], DOB 1935/01/31, MRN [id number]  PCP:  [name], MD  Cardiologist:  Cristopher Peru, MD (EP); Sanda Klein, MD   Chief Complaint  Patient presents with  . Follow-up  . Shortness of Breath    History of Present Illness:  Thomas [name] is a 82 y.o. male with chronic systolic heart failure related to ischemic cardiomyopathy, previous coronary bypass surgery and previous stent in SVG-OM for acute non-STEMI in 2015, long-term persistent atrial fibrillation, history of second degree Mobitz type II AV block, CRT-D Medtronic device implanted 2014.  As always, we need the services of a sign language interpreter. Since Traves and his wife just walked in today, we had to call for a Skype sign language interpreter.  He was seen in his primary care physician's office earlier today, where he is seeing a new provider. He was told that he needs to be seen by cardiology urgently since he had edema. Therefore they came straight over. He does describe mild exertional dyspnea, but does not have orthopnea or PND. He denies chest pain. Unfortunately his PCPs notes were not available when he came in, Although I can see the patient's vital signs in Care Everywhere. I think they might have misunderstood him.  He is not taking his diuretic every day but rather as needed. When he takes the diuretic the swelling improves, but it comes back promptly the next day.  Device interrogation on May 8 showed no interim arrhythmia, stable thoracic impedance and normal device function.  He had bypass surgery in 1995. Left ventricle systolic function deteriorated in 2014 without options for revascularization by cardiac catheterization and he underwent implantation of his CRT-D in October 2014. He had substantial clinical improvement and left ventricular EF also improved to 45-50%. In July 2015 he had an acute non-STEMI with placement of a stent to the SVG to  OM (bare-metal 3.512 mm MultiLink vision). Native coronary arteries were occluded, LIMA to LAD was patent with a 50-70% stenosis at the anastomosis and chronic total occlusion of SVG-RCA.  Past Medical History:  Diagnosis Date  . Biventricular ICD (implantable cardioverter-defibrillator) in place 03/24/2005   Implantation of a Medtronic Adapta ADDRO1, serial number [id number]  . CHF (congestive heart failure) ([place])   . CKD (chronic kidney disease), stage III   . Coronary artery disease    a. s/p CABG 1986. b. Multiple PCIs/caths. c. 09/2013: s/p PTCA and BMS to SVG-OM.  [name]   . History of abdominal aortic aneurysm   . History of bleeding peptic ulcer 1980  . History of epididymitis 2013  . HTN (hypertension)   . Hydronephrosis with ureteropelvic junction obstruction   . Hydroureter on left 2009  . Hypertension   . Ischemic cardiomyopathy    a. Prior EF 30-35%, s/p BIV-ICD. b. 09/2013: EF 45-50%.  . Moderate tricuspid regurgitation   . PAF (paroxysmal atrial fibrillation) ([place])   . Prostate cancer ([place])   . Status post coronary artery bypass grafting 1986   LIMA to the LAD, SVG to OM, SVG to RCA  . Testicular swelling     Past Surgical History:  Procedure Laterality Date  . 2-D echocardiogram  11/20/2011   Ejection fraction 30-35% moderate concentric left ventricular hypertrophy. Left atrium is moderately dilated. Mild MR. Mild or  . BI-VENTRICULAR IMPLANTABLE CARDIOVERTER DEFIBRILLATOR N/A 12/16/2012   Procedure: BI-VENTRICULAR IMPLANTABLE CARDIOVERTER DEFIBRILLATOR  (CRT-D);  Surgeon: [name],  MD;  Location: [facility];  Service: Cardiovascular;  Laterality: N/A;  . CARDIAC CATHETERIZATION  12/10/2011   SVG to OM widely patent.  LIMA to LAD patent  . CORONARY ARTERY BYPASS GRAFT  1986  . INSERT / REPLACE / REMOVE PACEMAKER    . LEFT HEART CATHETERIZATION WITH CORONARY/GRAFT ANGIOGRAM N/A 12/10/2011   Procedure: LEFT HEART CATHETERIZATION WITH Beatrix Fetters;   Surgeon: Sanda Klein, MD;  Location: [facility];  Service: Cardiovascular;  Laterality: N/A;  . LEFT HEART CATHETERIZATION WITH CORONARY/GRAFT ANGIOGRAM N/A 09/25/2013   Procedure: LEFT HEART CATHETERIZATION WITH Beatrix Fetters;  Surgeon: Blane Ohara, MD;  Location: [facility] C[facility];  Service: Cardiovascular;  Laterality: N/A;  . Persantine Myoview  05/06/2010   Post-rest ejection fraction 30%. No significant ischemia demonstrated. Compared to previous study there is no significant change.  . TRANSURETHRAL RESECTION OF PROSTATE     s/p    Current Medications: Outpatient Medications Prior to Visit  Medication Sig Dispense Refill  . aspirin EC 81 MG EC tablet Take 1 tablet (81 mg total) by mouth daily. 30 tablet 1  . carvedilol (COREG) 12.5 MG tablet TAKE 1 TABLET TWICE A DAY APPT NEEDED FOR REFILLS 60 tablet 4  . ELIQUIS 5 MG TABS tablet TAKE 1 TABLET (5 MG TOTAL) BY MOUTH 2 (TWO) TIMES DAILY. 60 tablet 5  . furosemide (LASIX) 40 MG tablet Take 1 tablet (40 mg total) by mouth daily. 60 tablet 3  . isosorbide mononitrate (IMDUR) 60 MG 24 hr tablet Take 1.5 tablets (90 mg total) by mouth daily. 45 tablet 5  . losartan-hydrochlorothiazide (HYZAAR) 50-12.5 MG tablet TAKE 1 TABLET BY MOUTH EVERY DAY **NEED OFFICE VISIT** 30 tablet 9  . oxymetazoline (AFRIN) 0.05 % nasal spray Place 2 sprays into both nostrils 2 (two) times daily as needed for congestion. Use as needed for nose bleeds (Patient not taking: Reported on 07/15/2016) 15 mL 2  . potassium chloride 20 MEQ TBCR Take 20 mEq by mouth daily. 60 tablet 3  . simvastatin (ZOCOR) 20 MG tablet Take 1 tablet (20 mg total) by mouth daily at 6 PM. <PLEASE MAKE APPOINTMENT FOR REFILLS> 30 tablet 0  . tamsulosin (FLOMAX) 0.4 MG CAPS capsule Take 0.4 mg by mouth daily.  3   No facility-administered medications prior to visit.      Allergies:   Phenazopyridine and Ramipril   Social History   Social History  . Marital status: Married     Spouse name: N/A  . Number of children: N/A  . Years of education: N/A   Social History Main Topics  . Smoking status: Former Smoker    Quit date: 03/15/1985  . Smokeless tobacco: Never Used  . Alcohol use No     Comment: occas.  . Drug use: No  . Sexual activity: Not Currently   Other Topics Concern  . Not on file   Social History Narrative  . No narrative on file     Family History:  The patient's family history includes Hypertension in his father.   ROS:   Please see the history of present illness.    ROS All other systems reviewed and are negative.   PHYSICAL EXAM:   VS:  Ht 6' (1.829 m)   , weight 204.8 kg (231 pounds), BMI 29.66, blood pressure 122/72, heart rate 82 bpm GEN: Well nourished, well developed, in no acute distress  HEENT: normal  Neck: 6-7 cm JVD, no carotid bruits, goiter or masses Cardiac:  Paradoxically split S2, RRR; 3/6 holosystolic murmur heard throughout the entire precordium, no diastolic murmurs, rubs, or gallops, mild (1-2+) ankle and pretibial symmetrical edema , healthy left subclavian defibrillator site Respiratory:  clear to auscultation bilaterally, normal work of breathing GI: soft, nontender, nondistended, + BS MS: no deformity or atrophy  Skin: warm and dry, no rash Neuro:  Alert and Oriented x 3, Strength and sensation are intact Psych: euthymic mood, full affect  Wt Readings from Last 3 Encounters:  07/15/16 224 lb 3.2 oz (101.7 kg)  04/23/16 228 lb (103.4 kg)  04/20/16 228 lb 9.6 oz (103.7 kg)      Studies/Labs Reviewed:   EKG:  EKG is not ordered today.  The Intracardiac electrogram today demonstrates 100% ventricular paced rhythm with background atrial fibrillation  Recent Labs: 07/15/2016: ALT 19; BUN 27; Creatinine, Ser 1.12; Hemoglobin 13.4; Platelets 133; Potassium 4.1; Sodium 140   Lipid Panel    Component Value Date/Time   CHOL 92 11/01/2015 0331   CHOL 139 11/20/2011 0624   TRIG 76 11/01/2015 0331   TRIG 108  11/20/2011 0624   HDL 31 (L) 11/01/2015 0331   HDL 26 (L) 11/20/2011 0624   CHOLHDL 3.0 11/01/2015 0331   VLDL 15 11/01/2015 0331   VLDL 22 11/20/2011 0624   LDLCALC 46 11/01/2015 0331   LDLCALC 91 11/20/2011 0624     ASSESSMENT:    1. Chronic systolic heart failure ([place])   2. Coronary artery disease involving coronary bypass graft of native heart with other forms of angina pectoris (HCC)   3. Permanent atrial fibrillation ([place])   4. Mobitz type II atrioventricular block   5. Biventricular ICD Medtronic Viva single chamber October 2014   6. Pure hypercholesterolemia   7. Benign essential HTN   8. Moderate tricuspid regurgitation      PLAN:  In order of problems listed above:  1. CHF: NYHA functional class II, by exam mildly hypervolemic on PRN low dose of loop diuretic. Weight is up 4-7 pounds, depending on which baseline we use. On appropriate angiotensin receptor blocker and beta blocker therapy. Moderate ischemic cardiomyopathy, most recent estimated LVEF 45%. Asked him to take his furosemide 40 mg every day and potassium 20 mEq every day and will perform a labs in a week. Reinforced the need for sodium restriction 2. CAD: He denies angina pectoris status post CABG and PCI of SVG to OM. Patent LIMA to LAD. Occluded native arteries and occluded SVG to RCA. Currently free of angina on beta blocker and long-acting nitrates. 3. AFib: on Eliquis anticoagulation. CHADSVasc 7 (age 32, TIA 2, HTN, CAD, CHF). Previous difficulty with warfarin anticoagulation. Aspirin restarted after presumed TIA. 4. High-grade AV block: device now programmed VVIR due to permanent atrial fibrillation. 5. CRT-D (Medtronic Viva quad XT)  With normal function, followed by Dr. [name]. Reviewed last remote download from May 8. 6. Hyperlipidemia on statin therapy. LDL cholesterol in target range 7. HTN: He reports good control. 8. TR: His echo last September shows severe dilation of the right ventricle with  mildly reduced right ventricular systolic function, although TR was described as being only mild-moderate. His murmur is quite prominent on exam. I wonder if we are underestimating the severity of the leak and whether it might be related to his pacemaker leads. 9.  Congenital deafness - I think there was some miscommunication about the need for his visit today. I wonder whether it was another issue that his primary care physician wanted addressed.  Medication Adjustments/Labs and Tests Ordered: Current medicines are reviewed at length with the patient today.  Concerns regarding medicines are outlined above.  Medication changes, Labs and Tests ordered today are listed in the Patient Instructions below.  Instructed to take furosemide 40 mg every day and potassium chloride 20 mEq every day and return for a labs in 1 week.  Signed, Sanda Klein, MD  08/20/2016 4:25 PM    [facility] [address], [place], [place]  [address] Phone: [phone]; Fax: [phone]

## 2016-08-20 ENCOUNTER — Encounter: Payer: Self-pay | Admitting: Cardiovascular Disease

## 2016-08-24 ENCOUNTER — Ambulatory Visit (INDEPENDENT_AMBULATORY_CARE_PROVIDER_SITE_OTHER): Payer: Managed Care, Other (non HMO) | Admitting: Physician Assistant

## 2016-08-24 ENCOUNTER — Telehealth: Payer: Self-pay | Admitting: Cardiovascular Disease

## 2016-08-24 ENCOUNTER — Encounter: Payer: Self-pay | Admitting: Physician Assistant

## 2016-08-24 VITALS — BP 96/60 | HR 66 | Ht 74.0 in | Wt 226.8 lb

## 2016-08-24 DIAGNOSIS — I255 Ischemic cardiomyopathy: Secondary | ICD-10-CM

## 2016-08-24 DIAGNOSIS — I25810 Atherosclerosis of coronary artery bypass graft(s) without angina pectoris: Secondary | ICD-10-CM | POA: Diagnosis not present

## 2016-08-24 DIAGNOSIS — I4821 Permanent atrial fibrillation: Secondary | ICD-10-CM

## 2016-08-24 DIAGNOSIS — Z95810 Presence of automatic (implantable) cardiac defibrillator: Secondary | ICD-10-CM | POA: Diagnosis not present

## 2016-08-24 DIAGNOSIS — I10 Essential (primary) hypertension: Secondary | ICD-10-CM | POA: Diagnosis not present

## 2016-08-24 DIAGNOSIS — I5022 Chronic systolic (congestive) heart failure: Secondary | ICD-10-CM

## 2016-08-24 DIAGNOSIS — I482 Chronic atrial fibrillation: Secondary | ICD-10-CM | POA: Diagnosis not present

## 2016-08-24 LAB — BASIC METABOLIC PANEL
BUN / CREAT RATIO: 25 — AB (ref 10–24)
BUN: 32 mg/dL — AB (ref 8–27)
CALCIUM: 9.8 mg/dL (ref 8.6–10.2)
CHLORIDE: 103 mmol/L (ref 96–106)
CO2: 23 mmol/L (ref 20–29)
CREATININE: 1.27 mg/dL (ref 0.76–1.27)
GFR calc non Af Amer: 53 mL/min/{1.73_m2} — ABNORMAL LOW (ref >59)
GFR, EST AFRICAN AMERICAN: 62 mL/min/{1.73_m2} (ref >59)
Glucose: 74 mg/dL (ref 65–99)
Potassium: 4.7 mmol/L (ref 3.5–5.2)
Sodium: 140 mmol/L (ref 134–144)

## 2016-08-24 NOTE — Patient Instructions (Addendum)
Medication Instructions:   MAKE SURE YOUR TAKING YOUR LASIX AND POTASSIUM DAILY  If you need a refill on your cardiac medications before your next appointment, please call your pharmacy.  Labwork:  BMET TODAY     Testing/Procedures: NONE ORDERED  TODAY    Follow-Up:  IN 2 MONTHS WITH DR CROTURO    Any Other Special Instructions Will Be Listed Below (If Applicable).                                                         Low-Sodium Eating Plan Sodium, which is an element that makes up salt, helps you maintain a healthy balance of fluids in your body. Too much sodium can increase your blood pressure and cause fluid and waste to be held in your body. Your health care provider or dietitian may recommend following this plan if you have high blood pressure (hypertension), kidney disease, liver disease, or heart failure. Eating less sodium can help lower your blood pressure, reduce swelling, and protect your heart, liver, and kidneys. What are tips for following this plan? General guidelines  Most people on this plan should limit their sodium intake to 1,500-2,000 mg (milligrams) of sodium each day. Reading food labels  The Nutrition Facts label lists the amount of sodium in one serving of the food. If you eat more than one serving, you must multiply the listed amount of sodium by the number of servings.  Choose foods with less than 140 mg of sodium per serving.  Avoid foods with 300 mg of sodium or more per serving. Shopping  Look for lower-sodium products, often labeled as "low-sodium" or "no salt added."  Always check the sodium content even if foods are labeled as "unsalted" or "no salt added".  Buy fresh foods. ? Avoid canned foods and premade or frozen meals. ? Avoid canned, cured, or processed meats  Buy breads that have less than 80 mg of sodium per slice. Cooking  Eat more home-cooked food and less restaurant, buffet, and fast food.  Avoid adding salt when  cooking. Use salt-free seasonings or herbs instead of table salt or sea salt. Check with your health care provider or pharmacist before using salt substitutes.  Cook with plant-based oils, such as canola, sunflower, or olive oil. Meal planning  When eating at a restaurant, ask that your food be prepared with less salt or no salt, if possible.  Avoid foods that contain MSG (monosodium glutamate). MSG is sometimes added to [place] food, bouillon, and some canned foods. What foods are recommended? The items listed may not be a complete list. Talk with your dietitian about what dietary choices are best for you. Grains Low-sodium cereals, including oats, puffed wheat and rice, and shredded wheat. Low-sodium crackers. Unsalted rice. Unsalted pasta. Low-sodium bread. Whole-grain breads and whole-grain pasta. Vegetables Fresh or frozen vegetables. "No salt added" canned vegetables. "No salt added" tomato sauce and paste. Low-sodium or reduced-sodium tomato and vegetable juice. Fruits Fresh, frozen, or canned fruit. Fruit juice. Meats and other protein foods Fresh or frozen (no salt added) meat, poultry, seafood, and fish. Low-sodium canned tuna and salmon. Unsalted nuts. Dried peas, beans, and lentils without added salt. Unsalted canned beans. Eggs. Unsalted nut butters. Dairy Milk. Soy milk. Cheese that is naturally low in sodium, such as ricotta cheese, fresh mozzarella,  or Swiss cheese Low-sodium or reduced-sodium cheese. Cream cheese. Yogurt. Fats and oils Unsalted butter. Unsalted margarine with no trans fat. Vegetable oils such as canola or olive oils. Seasonings and other foods Fresh and dried herbs and spices. Salt-free seasonings. Low-sodium mustard and ketchup. Sodium-free salad dressing. Sodium-free light mayonnaise. Fresh or refrigerated horseradish. Lemon juice. Vinegar. Homemade, reduced-sodium, or low-sodium soups. Unsalted popcorn and pretzels. Low-salt or salt-free chips. What foods  are not recommended? The items listed may not be a complete list. Talk with your dietitian about what dietary choices are best for you. Grains Instant hot cereals. Bread stuffing, pancake, and biscuit mixes. Croutons. Seasoned rice or pasta mixes. Noodle soup cups. Boxed or frozen macaroni and cheese. Regular salted crackers. Self-rising flour. Vegetables Sauerkraut, pickled vegetables, and relishes. Olives. [place] fries. Onion rings. Regular canned vegetables (not low-sodium or reduced-sodium). Regular canned tomato sauce and paste (not low-sodium or reduced-sodium). Regular tomato and vegetable juice (not low-sodium or reduced-sodium). Frozen vegetables in sauces. Meats and other protein foods Meat or fish that is salted, canned, smoked, spiced, or pickled. Bacon, ham, sausage, hotdogs, corned beef, chipped beef, packaged lunch meats, salt pork, jerky, pickled herring, anchovies, regular canned tuna, sardines, salted nuts. Dairy Processed cheese and cheese spreads. Cheese curds. Blue cheese. Feta cheese. String cheese. Regular cottage cheese. Buttermilk. Canned milk. Fats and oils Salted butter. Regular margarine. Ghee. Bacon fat. Seasonings and other foods Onion salt, garlic salt, seasoned salt, table salt, and sea salt. Canned and packaged gravies. Worcestershire sauce. Tartar sauce. Barbecue sauce. Teriyaki sauce. Soy sauce, including reduced-sodium. Steak sauce. Fish sauce. Oyster sauce. Cocktail sauce. Horseradish that you find on the shelf. Regular ketchup and mustard. Meat flavorings and tenderizers. Bouillon cubes. Hot sauce and Tabasco sauce. Premade or packaged marinades. Premade or packaged taco seasonings. Relishes. Regular salad dressings. Salsa. Potato and tortilla chips. Corn chips and puffs. Salted popcorn and pretzels. Canned or dried soups. Pizza. Frozen entrees and pot pies. Summary  Eating less sodium can help lower your blood pressure, reduce swelling, and protect your heart,  liver, and kidneys.  Most people on this plan should limit their sodium intake to 1,500-2,000 mg (milligrams) of sodium each day.  Canned, boxed, and frozen foods are high in sodium. Restaurant foods, fast foods, and pizza are also very high in sodium. You also get sodium by adding salt to food.  Try to cook at home, eat more fresh fruits and vegetables, and eat less fast food, canned, processed, or prepared foods. This information is not intended to replace advice given to you by your health care provider. Make sure you discuss any questions you have with your health care provider. Document Released: 08/22/2001 Document Revised: 02/24/2016 Document Reviewed: 02/24/2016 Elsevier Interactive Patient Education  2017 [employer].

## 2016-08-24 NOTE — Telephone Encounter (Signed)
Left message per DPR.   Samples at the front for the patient.  Medication Samples have been provided to the patient.  Drug name: Eliquis        Strength: 5 mg         Qty: 3 boxes   LOT: [id number]   Exp.Date: 10/20

## 2016-08-24 NOTE — Progress Notes (Signed)
Cardiology Office Note    Date:  08/24/2016   ID:  Thomas [name], DOB 1934/12/20, MRN [id number]  PCP:  [name], MD  Cardiologist: Dr. Sallyanne Kuster EPS: Dr. [name]  Chief Complaint  Patient presents with  . Follow-up    History of Present Illness:  Thomas [name] is a 82 y.o. male with history of ischemic cardiomyopathy, CABG in 1995, defibrillator placed in 2014 for deterioration of LV function with Substantial improvement in LV function EF 45-50%. NSTEMI treated with stent to the SVG to OM 09/2013 native arteries were occluded, LIMA to the LAD was patent with 50-70% stenosis at the anastomosis, chronic total SVG to the RCA. Patient has chronic systolic CHF, chronic atrial fibrillation, Mobitz type II AV block, hypertension, HLD, moderate TR.  Patient was seen on 08/17/16 urgently by Dr.Croitoru after being sent from primary care. He was found to be mildly hyper fully make with weight up 4-7 pounds. He was asked to take his Lasix and potassium daily and reinforced sodium restriction. Echo in September 2017 showed severe dilatation of the RV with mildly reduced RV systolic function although TR was described as being only mild to moderate. His murmur was quite prominent on exam. Question whether we are underestimating the severity of the leak and if it's related to his pacemaker leads.  Patient comes in today for follow-up. We are using the portable interpreter for sign language with interpreter Florentina Jenny [phone]. Patient's breathing is much better. He has lost 5 pounds on our scales 7 on his. He still has mild leg edema. He has stopped eating salty foods and is drinking more water. He denies any chest pain, palpitations or other cardiac complaints.  Past Medical History:  Diagnosis Date  . Biventricular ICD (implantable cardioverter-defibrillator) in place 03/24/2005   Implantation of a Medtronic Adapta ADDRO1, serial number [id number]  . CHF (congestive heart failure) ([place])   . CKD  (chronic kidney disease), stage III   . Coronary artery disease    a. s/p CABG 1986. b. Multiple PCIs/caths. c. 09/2013: s/p PTCA and BMS to SVG-OM.  [name]   . History of abdominal aortic aneurysm   . History of bleeding peptic ulcer 1980  . History of epididymitis 2013  . HTN (hypertension)   . Hydronephrosis with ureteropelvic junction obstruction   . Hydroureter on left 2009  . Hypertension   . Ischemic cardiomyopathy    a. Prior EF 30-35%, s/p BIV-ICD. b. 09/2013: EF 45-50%.  . Moderate tricuspid regurgitation   . PAF (paroxysmal atrial fibrillation) ([place])   . Prostate cancer ([place])   . Status post coronary artery bypass grafting 1986   LIMA to the LAD, SVG to OM, SVG to RCA  . Testicular swelling     Past Surgical History:  Procedure Laterality Date  . 2-D echocardiogram  11/20/2011   Ejection fraction 30-35% moderate concentric left ventricular hypertrophy. Left atrium is moderately dilated. Mild MR. Mild or  . BI-VENTRICULAR IMPLANTABLE CARDIOVERTER DEFIBRILLATOR N/A 12/16/2012   Procedure: BI-VENTRICULAR IMPLANTABLE CARDIOVERTER DEFIBRILLATOR  (CRT-D);  Surgeon: [name], MD;  Location: [facility] CATH LAB;  Service: Cardiovascular;  Laterality: N/A;  . CARDIAC CATHETERIZATION  12/10/2011   SVG to OM widely patent.  LIMA to LAD patent  . CORONARY ARTERY BYPASS GRAFT  1986  . INSERT / REPLACE / REMOVE PACEMAKER    . LEFT HEART CATHETERIZATION WITH CORONARY/GRAFT ANGIOGRAM N/A 12/10/2011   Procedure: LEFT HEART CATHETERIZATION WITH Beatrix Fetters;  Surgeon: Sanda Klein, MD;  Location: [facility] CATH LAB;  Service: Cardiovascular;  Laterality: N/A;  . LEFT HEART CATHETERIZATION WITH CORONARY/GRAFT ANGIOGRAM N/A 09/25/2013   Procedure: LEFT HEART CATHETERIZATION WITH Beatrix Fetters;  Surgeon: Blane Ohara, MD;  Location: [facility] CATH LAB;  Service: Cardiovascular;  Laterality: N/A;  . Persantine Myoview  05/06/2010   Post-rest ejection fraction 30%. No significant  ischemia demonstrated. Compared to previous study there is no significant change.  . TRANSURETHRAL RESECTION OF PROSTATE     s/p    Current Medications: Outpatient Medications Prior to Visit  Medication Sig Dispense Refill  . carvedilol (COREG) 12.5 MG tablet TAKE 1 TABLET TWICE A DAY APPT NEEDED FOR REFILLS 60 tablet 4  . ELIQUIS 5 MG TABS tablet TAKE 1 TABLET (5 MG TOTAL) BY MOUTH 2 (TWO) TIMES DAILY. 60 tablet 5  . isosorbide mononitrate (IMDUR) 60 MG 24 hr tablet Take 1.5 tablets (90 mg total) by mouth daily. 45 tablet 5  . losartan-hydrochlorothiazide (HYZAAR) 50-12.5 MG tablet TAKE 1 TABLET BY MOUTH EVERY DAY **NEED OFFICE VISIT** 30 tablet 9  . potassium chloride 20 MEQ TBCR Take 20 mEq by mouth daily. 60 tablet 3  . simvastatin (ZOCOR) 20 MG tablet Take 1 tablet (20 mg total) by mouth daily at 6 PM. <PLEASE MAKE APPOINTMENT FOR REFILLS> 30 tablet 0  . tamsulosin (FLOMAX) 0.4 MG CAPS capsule Take 0.4 mg by mouth daily.  3  . aspirin EC 81 MG EC tablet Take 1 tablet (81 mg total) by mouth daily. (Patient not taking: Reported on 08/24/2016) 30 tablet 1  . furosemide (LASIX) 40 MG tablet Take 1 tablet (40 mg total) by mouth daily. (Patient not taking: Reported on 08/24/2016) 60 tablet 3  . oxymetazoline (AFRIN) 0.05 % nasal spray Place 2 sprays into both nostrils 2 (two) times daily as needed for congestion. Use as needed for nose bleeds (Patient not taking: Reported on 07/15/2016) 15 mL 2   No facility-administered medications prior to visit.      Allergies:   Phenazopyridine and Ramipril   Social History   Social History  . Marital status: Married    Spouse name: N/A  . Number of children: N/A  . Years of education: N/A   Social History Main Topics  . Smoking status: Former Smoker    Quit date: 03/15/1985  . Smokeless tobacco: Never Used  . Alcohol use No     Comment: occas.  . Drug use: No  . Sexual activity: Not Currently   Other Topics Concern  . None   Social History  Narrative  . None     Family History:  The patient's   family history includes Hypertension in his father.   ROS:   Please see the history of present illness.    Review of Systems  Constitution: Negative.  HENT: Negative.   Cardiovascular: Positive for leg swelling.  Respiratory: Negative.   Endocrine: Negative.   Hematologic/Lymphatic: Negative.   Musculoskeletal: Negative.   Gastrointestinal: Negative.   Genitourinary: Negative.   Neurological: Negative.    All other systems reviewed and are negative.   PHYSICAL EXAM:   VS:  BP 96/60 (BP Location: Right Arm)   Pulse 66   Ht 6\' 2"  (1.88 m)   Wt 226 lb 12.8 oz (102.9 kg)   BMI 29.12 kg/m   Physical Exam  GEN: Well nourished, well developed, in no acute distress  Neck: no JVD, carotid bruits, or masses Cardiac:RRR; 2/6 systolic murmur at the left sternal border  Respiratory:  clear to auscultation bilaterally, normal work of breathing GI: soft, nontender, nondistended, + BS Ext: Mild ankle edema bilaterally without cyanosis, clubbing Good distal pulses bilaterally Neuro:  Alert and Oriented x 3 Psych: euthymic mood, full affect  Wt Readings from Last 3 Encounters:  08/24/16 226 lb 12.8 oz (102.9 kg)  07/15/16 224 lb 3.2 oz (101.7 kg)  04/23/16 228 lb (103.4 kg)      Studies/Labs Reviewed:   EKG:  EKG is not ordered today.    Recent Labs: 07/15/2016: ALT 19; BUN 27; Creatinine, Ser 1.12; Hemoglobin 13.4; Platelets 133; Potassium 4.1; Sodium 140   Lipid Panel    Component Value Date/Time   CHOL 92 11/01/2015 0331   CHOL 139 11/20/2011 0624   TRIG 76 11/01/2015 0331   TRIG 108 11/20/2011 0624   HDL 31 (L) 11/01/2015 0331   HDL 26 (L) 11/20/2011 0624   CHOLHDL 3.0 11/01/2015 0331   VLDL 15 11/01/2015 0331   VLDL 22 11/20/2011 0624   LDLCALC 46 11/01/2015 0331   LDLCALC 91 11/20/2011 0624    Additional studies/ records that were reviewed today include:   2-D echo 9/2017Study Conclusions   - Left  ventricle: LVEF is approxiamtely 45 to 50% with inferior   akinesis; inferoseptal hypokinesis consistent with conduction   delay The cavity size was severely dilated. Wall thickness was   increased in a pattern of mild LVH. The study is not technically   sufficient to allow evaluation of LV diastolic function. - Aortic valve: There was mild regurgitation. - Mitral valve: There was moderate regurgitation. - Left atrium: The atrium was severely dilated. - Right ventricle: The cavity size was severely dilated. Systolic   function was mildly reduced. - Right atrium: The atrium was moderately dilated. - Tricuspid valve: There was mild-moderate regurgitation.      ASSESSMENT:    1. Chronic systolic heart failure (HCC)   2. Cardiomyopathy, ischemic: Ejection fraction 45%   3. Coronary artery disease involving coronary bypass graft of native heart without angina pectoris   4. Permanent atrial fibrillation ([place])   5. Benign essential HTN   6. Biventricular ICD Medtronic Viva single chamber October 2014      PLAN:  In order of problems listed above:  Chronic systolic CHF much better compensated.Patient following a low salt diet and has lost 5 lbs. Continue daily lasix and potassium. Check BMET, f/u with Dr. Sallyanne Kuster in 2 months.  Ischemic cardiomyopathy ejection fraction 45% currently compensated  CAD with previous CABG and stenting see above dictation for details. No angina  Chronic atrial fibrillation on Eliquis and Coreg with good rate control  Benign essential hypertension blood pressure on the low side today but no symptoms  Biventricular ICD followed by Dr. [name]  Medication Adjustments/Labs and Tests Ordered: Current medicines are reviewed at length with the patient today.  Concerns regarding medicines are outlined above.  Medication changes, Labs and Tests ordered today are listed in the Patient Instructions below. Patient Instructions  Medication Instructions:   MAKE  SURE YOUR TAKING YOUR LASIX AND POTASSIUM DAILY  If you need a refill on your cardiac medications before your next appointment, please call your pharmacy.  Labwork:  BMET TODAY     Testing/Procedures: NONE ORDERED  TODAY    Follow-Up:  IN 2 MONTHS WITH DR CROTURO    Any Other Special Instructions Will Be Listed Below (If Applicable).  Low-Sodium Eating Plan Sodium, which is an element that makes up salt, helps you maintain a healthy balance of fluids in your body. Too much sodium can increase your blood pressure and cause fluid and waste to be held in your body. Your health care provider or dietitian may recommend following this plan if you have high blood pressure (hypertension), kidney disease, liver disease, or heart failure. Eating less sodium can help lower your blood pressure, reduce swelling, and protect your heart, liver, and kidneys. What are tips for following this plan? General guidelines  Most people on this plan should limit their sodium intake to 1,500-2,000 mg (milligrams) of sodium each day. Reading food labels  The Nutrition Facts label lists the amount of sodium in one serving of the food. If you eat more than one serving, you must multiply the listed amount of sodium by the number of servings.  Choose foods with less than 140 mg of sodium per serving.  Avoid foods with 300 mg of sodium or more per serving. Shopping  Look for lower-sodium products, often labeled as "low-sodium" or "no salt added."  Always check the sodium content even if foods are labeled as "unsalted" or "no salt added".  Buy fresh foods. ? Avoid canned foods and premade or frozen meals. ? Avoid canned, cured, or processed meats  Buy breads that have less than 80 mg of sodium per slice. Cooking  Eat more home-cooked food and less restaurant, buffet, and fast food.  Avoid adding salt when cooking. Use salt-free seasonings or  herbs instead of table salt or sea salt. Check with your health care provider or pharmacist before using salt substitutes.  Cook with plant-based oils, such as canola, sunflower, or olive oil. Meal planning  When eating at a restaurant, ask that your food be prepared with less salt or no salt, if possible.  Avoid foods that contain MSG (monosodium glutamate). MSG is sometimes added to [place] food, bouillon, and some canned foods. What foods are recommended? The items listed may not be a complete list. Talk with your dietitian about what dietary choices are best for you. Grains Low-sodium cereals, including oats, puffed wheat and rice, and shredded wheat. Low-sodium crackers. Unsalted rice. Unsalted pasta. Low-sodium bread. Whole-grain breads and whole-grain pasta. Vegetables Fresh or frozen vegetables. "No salt added" canned vegetables. "No salt added" tomato sauce and paste. Low-sodium or reduced-sodium tomato and vegetable juice. Fruits Fresh, frozen, or canned fruit. Fruit juice. Meats and other protein foods Fresh or frozen (no salt added) meat, poultry, seafood, and fish. Low-sodium canned tuna and salmon. Unsalted nuts. Dried peas, beans, and lentils without added salt. Unsalted canned beans. Eggs. Unsalted nut butters. Dairy Milk. Soy milk. Cheese that is naturally low in sodium, such as ricotta cheese, fresh mozzarella, or Swiss cheese Low-sodium or reduced-sodium cheese. Cream cheese. Yogurt. Fats and oils Unsalted butter. Unsalted margarine with no trans fat. Vegetable oils such as canola or olive oils. Seasonings and other foods Fresh and dried herbs and spices. Salt-free seasonings. Low-sodium mustard and ketchup. Sodium-free salad dressing. Sodium-free light mayonnaise. Fresh or refrigerated horseradish. Lemon juice. Vinegar. Homemade, reduced-sodium, or low-sodium soups. Unsalted popcorn and pretzels. Low-salt or salt-free chips. What foods are not recommended? The items  listed may not be a complete list. Talk with your dietitian about what dietary choices are best for you. Grains Instant hot cereals. Bread stuffing, pancake, and biscuit mixes. Croutons. Seasoned rice or pasta mixes. Noodle soup cups. Boxed or frozen macaroni and cheese. Regular salted  crackers. Self-rising flour. Vegetables Sauerkraut, pickled vegetables, and relishes. Olives. [place] fries. Onion rings. Regular canned vegetables (not low-sodium or reduced-sodium). Regular canned tomato sauce and paste (not low-sodium or reduced-sodium). Regular tomato and vegetable juice (not low-sodium or reduced-sodium). Frozen vegetables in sauces. Meats and other protein foods Meat or fish that is salted, canned, smoked, spiced, or pickled. Bacon, ham, sausage, hotdogs, corned beef, chipped beef, packaged lunch meats, salt pork, jerky, pickled herring, anchovies, regular canned tuna, sardines, salted nuts. Dairy Processed cheese and cheese spreads. Cheese curds. Blue cheese. Feta cheese. String cheese. Regular cottage cheese. Buttermilk. Canned milk. Fats and oils Salted butter. Regular margarine. Ghee. Bacon fat. Seasonings and other foods Onion salt, garlic salt, seasoned salt, table salt, and sea salt. Canned and packaged gravies. Worcestershire sauce. Tartar sauce. Barbecue sauce. Teriyaki sauce. Soy sauce, including reduced-sodium. Steak sauce. Fish sauce. Oyster sauce. Cocktail sauce. Horseradish that you find on the shelf. Regular ketchup and mustard. Meat flavorings and tenderizers. Bouillon cubes. Hot sauce and Tabasco sauce. Premade or packaged marinades. Premade or packaged taco seasonings. Relishes. Regular salad dressings. Salsa. Potato and tortilla chips. Corn chips and puffs. Salted popcorn and pretzels. Canned or dried soups. Pizza. Frozen entrees and pot pies. Summary  Eating less sodium can help lower your blood pressure, reduce swelling, and protect your heart, liver, and kidneys.  Most  people on this plan should limit their sodium intake to 1,500-2,000 mg (milligrams) of sodium each day.  Canned, boxed, and frozen foods are high in sodium. Restaurant foods, fast foods, and pizza are also very high in sodium. You also get sodium by adding salt to food.  Try to cook at home, eat more fresh fruits and vegetables, and eat less fast food, canned, processed, or prepared foods. This information is not intended to replace advice given to you by your health care provider. Make sure you discuss any questions you have with your health care provider. Document Released: 08/22/2001 Document Revised: 02/24/2016 Document Reviewed: 02/24/2016 Elsevier Interactive Patient Education  2017 [place], Ermalinda Barrios, [place]  08/24/2016 9:45 AM    [facility] [address], [place], [place]  [address] Phone: [phone]; Fax: [phone]

## 2016-08-24 NOTE — Addendum Note (Signed)
Addended by: Claude Manges on: 08/24/2016 09:50 AM   Modules accepted: Orders

## 2016-08-24 NOTE — Telephone Encounter (Signed)
Patient calling the office for samples of medication:   1.  What medication and dosage are you requesting samples for?sample Eliquis  2.  Are you currently out of this medication?

## 2016-08-25 ENCOUNTER — Ambulatory Visit: Payer: Medicare Other | Admitting: Student

## 2016-09-03 ENCOUNTER — Other Ambulatory Visit: Payer: Self-pay | Admitting: Cardiovascular Disease

## 2016-09-03 NOTE — Telephone Encounter (Signed)
Rx(s) sent to pharmacy electronically.  

## 2016-09-07 ENCOUNTER — Other Ambulatory Visit: Payer: Self-pay | Admitting: Cardiovascular Disease

## 2016-10-19 ENCOUNTER — Other Ambulatory Visit: Payer: PPO

## 2016-10-19 ENCOUNTER — Ambulatory Visit (INDEPENDENT_AMBULATORY_CARE_PROVIDER_SITE_OTHER): Payer: Medicare Other | Admitting: *Deleted

## 2016-10-19 DIAGNOSIS — I5022 Chronic systolic (congestive) heart failure: Secondary | ICD-10-CM | POA: Diagnosis not present

## 2016-10-19 DIAGNOSIS — I255 Ischemic cardiomyopathy: Secondary | ICD-10-CM

## 2016-10-19 DIAGNOSIS — C61 Malignant neoplasm of prostate: Secondary | ICD-10-CM

## 2016-10-20 LAB — PSA: Prostate Specific Ag, Serum: 0.2 ng/mL (ref 0.0–4.0)

## 2016-10-20 LAB — PLEASE NOTE

## 2016-10-20 NOTE — Progress Notes (Signed)
Remote ICD transmission.   

## 2016-10-21 ENCOUNTER — Encounter: Payer: Self-pay | Admitting: Cardiology

## 2016-10-26 ENCOUNTER — Ambulatory Visit (INDEPENDENT_AMBULATORY_CARE_PROVIDER_SITE_OTHER): Payer: Medicare Other | Admitting: Urology

## 2016-10-26 ENCOUNTER — Telehealth: Payer: Self-pay | Admitting: Cardiovascular Disease

## 2016-10-26 ENCOUNTER — Encounter: Payer: Self-pay | Admitting: Urology

## 2016-10-26 VITALS — BP 144/76 | HR 87 | Ht 74.0 in | Wt 228.0 lb

## 2016-10-26 DIAGNOSIS — R310 Gross hematuria: Secondary | ICD-10-CM

## 2016-10-26 DIAGNOSIS — R972 Elevated prostate specific antigen [PSA]: Secondary | ICD-10-CM

## 2016-10-26 LAB — URINALYSIS, COMPLETE
BILIRUBIN UA: NEGATIVE
Glucose, UA: NEGATIVE
KETONES UA: NEGATIVE
Leukocytes, UA: NEGATIVE
Nitrite, UA: NEGATIVE
SPEC GRAV UA: 1.025 (ref 1.005–1.030)
Urobilinogen, Ur: 0.2 mg/dL (ref 0.2–1.0)
pH, UA: 6.5 (ref 5.0–7.5)

## 2016-10-26 LAB — MICROSCOPIC EXAMINATION
BACTERIA UA: NONE SEEN
EPITHELIAL CELLS (NON RENAL): NONE SEEN /HPF (ref 0–10)
WBC, UA: NONE SEEN /hpf (ref 0–?)

## 2016-10-26 NOTE — Progress Notes (Signed)
10/26/2016 10:36 AM   Thomas [name] [birth year] [id number]  Referring provider: [name], MD [address] [facility] [place], [place] [address]  Chief Complaint  Patient presents with  . Hematuria    PSA results    HPI: The patient has a past history of kidney stones treated here. He has prostate cancer treated with external beam radiation and a PSA value in 2017 was 0.4. He was evaluated in 2017 for blood in the urine. He was noted to have a mild bulbar stricture dilated with the scope during that workup.  Today The PSA was ordered on 10/19/2016 and was 0.2  No blood in urine. Flow was good. Frequency normal. No infections. Urinalysis negative   PMH: Past Medical History:  Diagnosis Date  . Biventricular ICD (implantable cardioverter-defibrillator) in place 03/24/2005   Implantation of a Medtronic Adapta ADDRO1, serial number [id number]  . CHF (congestive heart failure) ([place])   . CKD (chronic kidney disease), stage III   . Coronary artery disease    a. s/p CABG 1986. b. Multiple PCIs/caths. c. 09/2013: s/p PTCA and BMS to SVG-OM.  [name]   . History of abdominal aortic aneurysm   . History of bleeding peptic ulcer 1980  . History of epididymitis 2013  . HTN (hypertension)   . Hydronephrosis with ureteropelvic junction obstruction   . Hydroureter on left 2009  . Hypertension   . Ischemic cardiomyopathy    a. Prior EF 30-35%, s/p BIV-ICD. b. 09/2013: EF 45-50%.  . Moderate tricuspid regurgitation   . PAF (paroxysmal atrial fibrillation) ([place])   . Prostate cancer ([place])   . Status post coronary artery bypass grafting 1986   LIMA to the LAD, SVG to OM, SVG to RCA  . Testicular swelling     Surgical History: Past Surgical History:  Procedure Laterality Date  . 2-D echocardiogram  11/20/2011   Ejection fraction 30-35% moderate concentric left ventricular hypertrophy. Left atrium is moderately dilated. Mild MR. Mild or  . BI-VENTRICULAR IMPLANTABLE  CARDIOVERTER DEFIBRILLATOR N/A 12/16/2012   Procedure: BI-VENTRICULAR IMPLANTABLE CARDIOVERTER DEFIBRILLATOR  (CRT-D);  Surgeon: [name], MD;  Location: [facility] CATH LAB;  Service: Cardiovascular;  Laterality: N/A;  . CARDIAC CATHETERIZATION  12/10/2011   SVG to OM widely patent.  LIMA to LAD patent  . CORONARY ARTERY BYPASS GRAFT  1986  . INSERT / REPLACE / REMOVE PACEMAKER    . LEFT HEART CATHETERIZATION WITH CORONARY/GRAFT ANGIOGRAM N/A 12/10/2011   Procedure: LEFT HEART CATHETERIZATION WITH Beatrix Fetters;  Surgeon: Sanda Klein, MD;  Location: [facility] CATH LAB;  Service: Cardiovascular;  Laterality: N/A;  . LEFT HEART CATHETERIZATION WITH CORONARY/GRAFT ANGIOGRAM N/A 09/25/2013   Procedure: LEFT HEART CATHETERIZATION WITH Beatrix Fetters;  Surgeon: Blane Ohara, MD;  Location: [facility] CATH LAB;  Service: Cardiovascular;  Laterality: N/A;  . Persantine Myoview  05/06/2010   Post-rest ejection fraction 30%. No significant ischemia demonstrated. Compared to previous study there is no significant change.  . TRANSURETHRAL RESECTION OF PROSTATE     s/p    Home Medications:  Allergies as of 10/26/2016      Reactions   Phenazopyridine Nausea Only, Other (See Comments)   Other Reaction: GI UPSET   Ramipril Other (See Comments)      Medication List       Accurate as of 10/26/16 10:36 AM. Always use your most recent med list.          carvedilol 12.5 MG tablet Commonly known as:  COREG Take 1 tablet (12.5 mg total) by mouth 2 (two) times daily with a meal.   ELIQUIS 5 MG Tabs tablet Generic drug:  apixaban TAKE 1 TABLET (5 MG TOTAL) BY MOUTH 2 (TWO) TIMES DAILY.   furosemide 40 MG tablet Commonly known as:  LASIX Take 40 mg by mouth 2 (two) times daily.   isosorbide mononitrate 60 MG 24 hr tablet Commonly known as:  IMDUR Take 1.5 tablets (90 mg total) by mouth daily.   losartan-hydrochlorothiazide 50-12.5 MG tablet Commonly known as:  HYZAAR TAKE 1 TABLET BY  MOUTH EVERY DAY **NEED OFFICE VISIT**   Potassium Chloride ER 20 MEQ Tbcr Take 20 mEq by mouth daily.   simvastatin 20 MG tablet Commonly known as:  ZOCOR TAKE 1 TABLET BY MOUTH DAILY   tamsulosin 0.4 MG Caps capsule Commonly known as:  FLOMAX Take 0.4 mg by mouth daily.   vitamin B-12 500 MCG tablet Commonly known as:  CYANOCOBALAMIN Take 500 mcg by mouth daily.       Allergies:  Allergies  Allergen Reactions  . Phenazopyridine Nausea Only and Other (See Comments)    Other Reaction: GI UPSET  . Ramipril Other (See Comments)    Family History: Family History  Problem Relation Age of Onset  . Hypertension Father     Social History:  reports that he quit smoking about 31 years ago. He has never used smokeless tobacco. He reports that he does not drink alcohol or use drugs.  ROS: UROLOGY Frequent Urination?: No Hard to postpone urination?: No Burning/pain with urination?: No Get up at night to urinate?: No Leakage of urine?: No Urine stream starts and stops?: No Trouble starting stream?: No Do you have to strain to urinate?: No Blood in urine?: No Urinary tract infection?: No Sexually transmitted disease?: No Injury to kidneys or bladder?: No Painful intercourse?: No Weak stream?: No Erection problems?: No Penile pain?: No  Gastrointestinal Nausea?: No Vomiting?: No Indigestion/heartburn?: No Diarrhea?: No Constipation?: No  Constitutional Fever: No Night sweats?: No Weight loss?: No Fatigue?: No  Skin Skin rash/lesions?: No Itching?: No  Eyes Blurred vision?: No Double vision?: No  Ears/Nose/Throat Sore throat?: No Sinus problems?: No  Hematologic/Lymphatic Swollen glands?: No Easy bruising?: No  Cardiovascular Leg swelling?: No Chest pain?: No  Respiratory Cough?: No Shortness of breath?: No  Endocrine Excessive thirst?: No  Musculoskeletal Back pain?: No Joint pain?: No  Neurological Headaches?: No Dizziness?:  No  Psychologic Depression?: No Anxiety?: No  Physical Exam: BP (!) 144/76 (BP Location: Left Arm, Patient Position: Sitting, Cuff Size: Normal)   Pulse 87   Ht 6\' 2"  (1.88 m)   Wt 228 lb (103.4 kg)   BMI 29.27 kg/m     Laboratory Data: Lab Results  Component Value Date   WBC 5.2 07/15/2016   HGB 13.4 07/15/2016   HCT 39.1 (L) 07/15/2016   MCV 93.5 07/15/2016   PLT 133 (L) 07/15/2016    Lab Results  Component Value Date   CREATININE 1.27 08/24/2016    No results found for: PSA  No results found for: TESTOSTERONE  Lab Results  Component Value Date   HGBA1C 5.7 (H) 07/15/2016    Urinalysis    Component Value Date/Time   COLORURINE YELLOW (A) 07/15/2016 0557   APPEARANCEUR CLEAR (A) 07/15/2016 0557   APPEARANCEUR Clear 09/18/2015 0924   LABSPEC 1.019 07/15/2016 0557   LABSPEC 1.021 11/19/2011 1501   PHURINE 5.0 07/15/2016 [place] 07/15/2016 0557  GLUCOSEU Negative 11/19/2011 1501   HGBUR SMALL (A) 07/15/2016 0557   BILIRUBINUR NEGATIVE 07/15/2016 0557   BILIRUBINUR Negative 09/18/2015 0924   BILIRUBINUR Negative 11/19/2011 1501   KETONESUR NEGATIVE 07/15/2016 0557   PROTEINUR NEGATIVE 07/15/2016 0557   NITRITE NEGATIVE 07/15/2016 0557   LEUKOCYTESUR NEGATIVE 07/15/2016 0557   LEUKOCYTESUR Negative 09/18/2015 0924   LEUKOCYTESUR Negative 11/19/2011 1501    Pertinent Imaging: None  Assessment & Plan:  I offered to see the patient when necessary but he would rather have a PSA once a year. We will see him next year  1. Gross hematuria 2. Prostate cancer - Urinalysis, Complete   Return in about 1 year (around 10/26/2017).  Reece Packer, MD  [facility] [address] [place], [place] [address] [phone]

## 2016-10-26 NOTE — Telephone Encounter (Signed)
New message   Patient calling the office for samples of medication:   1.  What medication and dosage are you requesting samples for?ELIQUIS 5 MG TABS tablet  2.  Are you currently out of this medication? Has 2 left

## 2016-10-27 ENCOUNTER — Telehealth: Payer: Self-pay | Admitting: Cardiovascular Disease

## 2016-10-27 NOTE — Telephone Encounter (Signed)
Tee called patient earlier. No answer at that time, but w help of TTY interpretation service she informed patient in message that Eliquis samples are currently not in supply at our office. I have also left msg for patient to inform that we do not currently have samples.

## 2016-10-27 NOTE — Telephone Encounter (Signed)
New Message     Patient calling the office for samples of medication:   1.  What medication and dosage are you requesting samples for? Eliquis 5 mg   2.  Are you currently out of this medication? 1 pill left , they would like to pick up in the morning

## 2016-10-27 NOTE — Telephone Encounter (Signed)
No answer will attempt later

## 2016-10-27 NOTE — Telephone Encounter (Signed)
Left message for patient informing him that we are out of Eliquis samples at this time; advised to check with the office next week.   [phone]-Interpreter number

## 2016-10-28 ENCOUNTER — Other Ambulatory Visit: Payer: Self-pay

## 2016-11-05 LAB — CUP PACEART REMOTE DEVICE CHECK
Battery Remaining Longevity: 28 mo
Battery Voltage: 2.95 V
Brady Statistic AS VP Percent: 100 %
Brady Statistic RA Percent Paced: 0 %
Date Time Interrogation Session: 20180806041604
HIGH POWER IMPEDANCE MEASURED VALUE: 74 Ohm
Implantable Lead Implant Date: 20000210
Implantable Lead Implant Date: 20000210
Implantable Lead Location: 753858
Implantable Lead Serial Number: 413633
Implantable Pulse Generator Implant Date: 20141003
Lead Channel Impedance Value: 285 Ohm
Lead Channel Impedance Value: 342 Ohm
Lead Channel Impedance Value: 342 Ohm
Lead Channel Impedance Value: 380 Ohm
Lead Channel Impedance Value: 456 Ohm
Lead Channel Impedance Value: 608 Ohm
Lead Channel Impedance Value: 646 Ohm
Lead Channel Impedance Value: 646 Ohm
Lead Channel Impedance Value: 703 Ohm
Lead Channel Pacing Threshold Pulse Width: 0.6 ms
Lead Channel Sensing Intrinsic Amplitude: 0.875 mV
Lead Channel Sensing Intrinsic Amplitude: 0.875 mV
Lead Channel Sensing Intrinsic Amplitude: 4.5 mV
Lead Channel Setting Pacing Amplitude: 2 V
Lead Channel Setting Pacing Pulse Width: 0.4 ms
Lead Channel Setting Sensing Sensitivity: 0.3 mV
MDC IDC LEAD IMPLANT DT: 20141003
MDC IDC LEAD LOCATION: 753859
MDC IDC LEAD LOCATION: 753860
MDC IDC LEAD SERIAL: 272469
MDC IDC MSMT LEADCHNL LV IMPEDANCE VALUE: 342 Ohm
MDC IDC MSMT LEADCHNL LV IMPEDANCE VALUE: 380 Ohm
MDC IDC MSMT LEADCHNL LV IMPEDANCE VALUE: 646 Ohm
MDC IDC MSMT LEADCHNL LV PACING THRESHOLD AMPLITUDE: 1.25 V
MDC IDC MSMT LEADCHNL RA IMPEDANCE VALUE: 399 Ohm
MDC IDC MSMT LEADCHNL RV PACING THRESHOLD AMPLITUDE: 0.75 V
MDC IDC MSMT LEADCHNL RV PACING THRESHOLD PULSEWIDTH: 0.4 ms
MDC IDC MSMT LEADCHNL RV SENSING INTR AMPL: 4.5 mV
MDC IDC SET LEADCHNL LV PACING AMPLITUDE: 2.25 V
MDC IDC SET LEADCHNL LV PACING PULSEWIDTH: 0.6 ms
MDC IDC STAT BRADY AP VP PERCENT: 0 %
MDC IDC STAT BRADY AP VS PERCENT: 0 %
MDC IDC STAT BRADY AS VS PERCENT: 0 %
MDC IDC STAT BRADY RV PERCENT PACED: 98.18 %

## 2016-11-11 ENCOUNTER — Other Ambulatory Visit: Payer: Self-pay | Admitting: Cardiovascular Disease

## 2016-11-11 NOTE — Telephone Encounter (Signed)
REFILL 

## 2016-11-30 ENCOUNTER — Ambulatory Visit (INDEPENDENT_AMBULATORY_CARE_PROVIDER_SITE_OTHER): Payer: PPO | Admitting: Cardiovascular Disease

## 2016-11-30 ENCOUNTER — Encounter: Payer: Self-pay | Admitting: Cardiovascular Disease

## 2016-11-30 VITALS — BP 120/63 | HR 66 | Ht 74.0 in | Wt 231.0 lb

## 2016-11-30 DIAGNOSIS — I5022 Chronic systolic (congestive) heart failure: Secondary | ICD-10-CM

## 2016-11-30 DIAGNOSIS — Z95810 Presence of automatic (implantable) cardiac defibrillator: Secondary | ICD-10-CM | POA: Diagnosis not present

## 2016-11-30 DIAGNOSIS — I4821 Permanent atrial fibrillation: Secondary | ICD-10-CM

## 2016-11-30 DIAGNOSIS — I441 Atrioventricular block, second degree: Secondary | ICD-10-CM | POA: Diagnosis not present

## 2016-11-30 DIAGNOSIS — H9193 Unspecified hearing loss, bilateral: Secondary | ICD-10-CM

## 2016-11-30 DIAGNOSIS — I071 Rheumatic tricuspid insufficiency: Secondary | ICD-10-CM

## 2016-11-30 DIAGNOSIS — E7800 Pure hypercholesterolemia, unspecified: Secondary | ICD-10-CM | POA: Diagnosis not present

## 2016-11-30 DIAGNOSIS — I255 Ischemic cardiomyopathy: Secondary | ICD-10-CM

## 2016-11-30 DIAGNOSIS — Z7901 Long term (current) use of anticoagulants: Secondary | ICD-10-CM | POA: Diagnosis not present

## 2016-11-30 DIAGNOSIS — I482 Chronic atrial fibrillation: Secondary | ICD-10-CM

## 2016-11-30 DIAGNOSIS — I10 Essential (primary) hypertension: Secondary | ICD-10-CM | POA: Diagnosis not present

## 2016-11-30 DIAGNOSIS — I25810 Atherosclerosis of coronary artery bypass graft(s) without angina pectoris: Secondary | ICD-10-CM

## 2016-11-30 MED ORDER — LORATADINE 10 MG PO TABS: 10.0000 mg | ORAL_TABLET | Freq: Every day | ORAL | 3 refills | Status: DC

## 2016-11-30 MED ORDER — POTASSIUM CHLORIDE ER 10 MEQ PO TBCR: 30.0000 meq | EXTENDED_RELEASE_TABLET | Freq: Every day | ORAL | 3 refills | Status: DC

## 2016-11-30 MED ORDER — FUROSEMIDE 40 MG PO TABS: ORAL_TABLET | ORAL | 3 refills | Status: DC

## 2016-11-30 NOTE — Progress Notes (Signed)
Cardiology Office Note    Date:  12/01/2016   ID:  Thomas [name], DOB Oct 02, 1934, MRN [id number]  PCP:  [name], MD  Cardiologist:  Cristopher Peru, MD (EP); Sanda Klein, MD   Chief Complaint  Patient presents with  . Follow-up    potassium makes legs and feet itch, and frequent urination     History of Present Illness:  Thomas [name] is a 82 y.o. male with chronic systolic heart failure related to ischemic cardiomyopathy, previous coronary bypass surgery and previous stent in SVG-OM for acute non-STEMI in 2015, long-term persistent atrial fibrillation, history of second degree Mobitz type II AV block, CRT-D Medtronic device implanted 2014.  As always, we need the services of a sign language interpreter. Thomas [name] is here today: she has always provided excellent services and has excellent rapport with Thomas [name].  He has not had any cardiac problems recently but he has struggled with a cough productive of yellow sputum for several weeks now. He has tried 3 different antibiotics, he tells me. He does not have fever or pleurisy. He does not have orthopnea or PND and has not been troubled by leg edema. However, according to our scale he is roughly 5 pounds above his ideal weight and on exam he does have a little bit of jugular venous distention. In contradistinction, his thoracic impedance (Optivol) does not show evidence of hypervolemia. He has not had angina pectoris or leg edema. 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  Device interrogation shows normal function. The presenting rhythm is biventricular pacing at about 80 bpm. He has not had any recorded ventricular tachycardia since January 2017. V sensing events are infrequent and brief, longest only 15 seconds. BiV pacing is essentially 100%. Activity remains excellent at roughly 4 hours per day and constant over the last several months. Generator longevity is 2.2 years. The left ventricular lead threshold  is stable at 1.25 V at 0.6 ms. Heart rate histogram distribution is appropriate. He has an appointment with Dr. [name] in November.   Device interrogation on May 8 showed no interim arrhythmia, stable thoracic impedance and normal device function.  He had bypass surgery in 1995. Left ventricle systolic function deteriorated in 2014 without options for revascularization by cardiac catheterization and he underwent implantation of his CRT-D in October 2014. He had substantial clinical improvement and left ventricular EF also improved to 45-50%. In July 2015 he had an acute non-STEMI with placement of a stent to the SVG to OM (bare-metal 3.512 mm MultiLink vision). Native coronary arteries were occluded, LIMA to LAD was patent with a 50-70% stenosis at the anastomosis and chronic total occlusion of SVG-RCA.  Past Medical History:  Diagnosis Date  . Biventricular ICD (implantable cardioverter-defibrillator) in place 03/24/2005   Implantation of a Medtronic Adapta ADDRO1, serial number [id number]  . CHF (congestive heart failure) ([place])   . CKD (chronic kidney disease), stage III   . Coronary artery disease    a. s/p CABG 1986. b. Multiple PCIs/caths. c. 09/2013: s/p PTCA and BMS to SVG-OM.  [name]   . History of abdominal aortic aneurysm   . History of bleeding peptic ulcer 1980  . History of epididymitis 2013  . HTN (hypertension)   . Hydronephrosis with ureteropelvic junction obstruction   . Hydroureter on left 2009  . Hypertension   . Ischemic cardiomyopathy    a. Prior EF 30-35%, s/p BIV-ICD. b. 09/2013: EF 45-50%.  . Moderate  tricuspid regurgitation   . PAF (paroxysmal atrial fibrillation) ([place])   . Prostate cancer ([place])   . Status post coronary artery bypass grafting 1986   LIMA to the LAD, SVG to OM, SVG to RCA  . Testicular swelling     Past Surgical History:  Procedure Laterality Date  . 2-D echocardiogram  11/20/2011   Ejection fraction 30-35% moderate concentric left  ventricular hypertrophy. Left atrium is moderately dilated. Mild MR. Mild or  . BI-VENTRICULAR IMPLANTABLE CARDIOVERTER DEFIBRILLATOR N/A 12/16/2012   Procedure: BI-VENTRICULAR IMPLANTABLE CARDIOVERTER DEFIBRILLATOR  (CRT-D);  Surgeon: [name], MD;  Location: [facility] CATH LAB;  Service: Cardiovascular;  Laterality: N/A;  . CARDIAC CATHETERIZATION  12/10/2011   SVG to OM widely patent.  LIMA to LAD patent  . CORONARY ARTERY BYPASS GRAFT  1986  . INSERT / REPLACE / REMOVE PACEMAKER    . LEFT HEART CATHETERIZATION WITH CORONARY/GRAFT ANGIOGRAM N/A 12/10/2011   Procedure: LEFT HEART CATHETERIZATION WITH Beatrix Fetters;  Surgeon: Sanda Klein, MD;  Location: [place] CATH LAB;  Service: Cardiovascular;  Laterality: N/A;  . LEFT HEART CATHETERIZATION WITH CORONARY/GRAFT ANGIOGRAM N/A 09/25/2013   Procedure: LEFT HEART CATHETERIZATION WITH Beatrix Fetters;  Surgeon: Blane Ohara, MD;  Location: [facility] CATH LAB;  Service: Cardiovascular;  Laterality: N/A;  . Persantine Myoview  05/06/2010   Post-rest ejection fraction 30%. No significant ischemia demonstrated. Compared to previous study there is no significant change.  . TRANSURETHRAL RESECTION OF PROSTATE     s/p    Current Medications: Outpatient Medications Prior to Visit  Medication Sig Dispense Refill  . carvedilol (COREG) 12.5 MG tablet Take 1 tablet (12.5 mg total) by mouth 2 (two) times daily with a meal. 180 tablet 3  . ELIQUIS 5 MG TABS tablet TAKE 1 TABLET (5 MG TOTAL) BY MOUTH 2 (TWO) TIMES DAILY. 60 tablet 5  . isosorbide mononitrate (IMDUR) 60 MG 24 hr tablet TAKE 1.5 TABLETS (90 MG TOTAL) BY MOUTH DAILY. 45 tablet 5  . losartan-hydrochlorothiazide (HYZAAR) 50-12.5 MG tablet TAKE 1 TABLET BY MOUTH EVERY DAY **NEED OFFICE VISIT** 30 tablet 9  . simvastatin (ZOCOR) 20 MG tablet TAKE 1 TABLET BY MOUTH DAILY 30 tablet 6  . tamsulosin (FLOMAX) 0.4 MG CAPS capsule Take 0.4 mg by mouth daily.  3  . vitamin B-12 (CYANOCOBALAMIN)  500 MCG tablet Take 500 mcg by mouth daily.    . furosemide (LASIX) 40 MG tablet Take 40 mg by mouth 2 (two) times daily.    . potassium chloride 20 MEQ TBCR Take 20 mEq by mouth daily. 60 tablet 3   No facility-administered medications prior to visit.      Allergies:   Phenazopyridine and Ramipril   Social History   Social History  . Marital status: Married    Spouse name: N/A  . Number of children: N/A  . Years of education: N/A   Social History Main Topics  . Smoking status: Former Smoker    Quit date: 03/15/1985  . Smokeless tobacco: Never Used  . Alcohol use No     Comment: occas.  . Drug use: No  . Sexual activity: Not Currently   Other Topics Concern  . None   Social History Narrative  . None     Family History:  The patient's family history includes Hypertension in his father.   ROS:   Please see the history of present illness.    ROS All other systems reviewed and are negative.   PHYSICAL EXAM:  VS:  BP 120/63   [name] 68   Ht 6\' 2"  (1.88 m)   Wt 231 lb (104.8 kg)   BMI 29.66 kg/m   , weight 204.8 kg (231 pounds), BMI 29.66, blood pressure 122/72, heart rate 82 bpm  General: Alert, oriented x3, no distress, Borderline obese Head: no evidence of trauma, PERRL, EOMI, no exophtalmos or lid lag, no myxedema, no xanthelasma; normal ears, nose and oropharynx Neck: normal jugular venous pulsations and no hepatojugular reflux; brisk carotid pulses without delay and no carotid bruits Chest: clear to auscultation, no signs of consolidation by percussion or palpation, normal fremitus, symmetrical and full respiratory excursions Cardiovascular: normal position and quality of the apical impulse, regular rhythm, Paradoxically split S2, RRR; 3/6 holosystolic murmur heard throughout the entire precordium, no diastolic murmurs, rubs or gallops Abdomen: no tenderness or distention, no masses by palpation, no abnormal pulsatility or arterial bruits, normal bowel sounds, no  hepatosplenomegaly Extremities: no clubbing, cyanosis or edema; 2+ radial, ulnar and brachial pulses bilaterally; 2+ right femoral, posterior tibial and dorsalis pedis pulses; 2+ left femoral, posterior tibial and dorsalis pedis pulses; no subclavian or femoral bruits Neurological: grossly nonfocal Psych: Normal mood and affect   Wt Readings from Last 3 Encounters:  11/30/16 231 lb (104.8 kg)  10/26/16 228 lb (103.4 kg)  08/24/16 226 lb 12.8 oz (102.9 kg)      Studies/Labs Reviewed:   EKG:  EKG is ordered today.  It shows biventricular pacing at 82 bpm. The QRS is quite broad at 166 ms but there is evidence of left ventricular effective pacing. QTC 516 ms  Recent Labs: 07/15/2016: ALT 19; Hemoglobin 13.4; Platelets 133 08/24/2016: BUN 32; Creatinine, Ser 1.27; Potassium 4.7; Sodium 140   Lipid Panel    Component Value Date/Time   CHOL 92 11/01/2015 0331   CHOL 139 11/20/2011 0624   TRIG 76 11/01/2015 0331   TRIG 108 11/20/2011 0624   HDL 31 (L) 11/01/2015 0331   HDL 26 (L) 11/20/2011 0624   CHOLHDL 3.0 11/01/2015 0331   VLDL 15 11/01/2015 0331   VLDL 22 11/20/2011 0624   LDLCALC 46 11/01/2015 0331   LDLCALC 91 11/20/2011 0624     ASSESSMENT:    1. Chronic systolic heart failure ([place])   2. Coronary artery disease involving coronary bypass graft of native heart without angina pectoris   3. Permanent atrial fibrillation ([place])   4. Long term current use of anticoagulant   5. Mobitz type II atrioventricular block   6. Biventricular ICD (implantable cardioverter-defibrillator) in place   7. Pure hypercholesterolemia   8. Benign essential HTN   9. Moderate tricuspid regurgitation   10. Bilateral deafness      PLAN:  In order of problems listed above:  1. CHF: Functional class II status, weight suggest that he is mildly hypervolemic, maybe by about 5 pounds, but he is clearly not in heart failure. His cough seems to be unrelated to heart failure. He does have yellowish  sputum and this has not improved despite antibiotics. Will try to see if a course of antihistamines helps. On appropriate angiotensin receptor blocker and beta blocker therapy. Moderate ischemic cardiomyopathy, most recent estimated LVEF 45%. Reminded him about the need for sodium restriction and how to monitor his weight on a daily basis. He has been taking furosemide as needed only, he will take it daily with a potassium supplement. 2. CAD: He has not had angina pectoris. He is status post CABG and PCI of SVG  to OM. Patent LIMA to LAD. Occluded native arteries and occluded SVG to RCA. He is on 2 antianginal medications: Beta blockers and long-acting nitrates 3. AFib: Rate control has not been a problem.CHADSVasc 7 (age 43, TIA 2, HTN, CAD, CHF). 4. Eliquis anticoagulation. No recent bleeding problems. Previous difficulty with warfarin anticoagulation.  5. High-grade AV block: He is not device dependent, but is 100% paced. 6. CRT-D (Medtronic Viva quad XT)  With normal device function, next appointment with Dr. [name] November.  7. Hyperlipidemia on statin therapy his most recent LDL was excellent at 46 8. HTN: Excellent blood pressure control, no symptoms of hypotension. Consider transition to [facility] if financially acceptable. 9. TR: His echo last September shows severe dilation of the right ventricle with mildly reduced right ventricular systolic function. Right cuspid regurgitation was described as being only mild to moderate. By physical exam seems a little more important than that and maybe partly related to his pacemaker leads. 10.  Congenital deafness -always needs a sign language interpreter  Medication Adjustments/Labs and Tests Ordered: Current medicines are reviewed at length with the patient today.  Concerns regarding medicines are outlined above.  Medication changes, Labs and Tests ordered today are listed in the Patient Instructions below.  Instructed to take furosemide 40 mg every day  and potassium chloride 20 mEq every day and return for a labs in 1 week.  Signed, Sanda Klein, MD  12/01/2016 7:16 PM    [address] [place], [place], [place]  [address] Phone: [phone]; Fax: [phone]

## 2016-11-30 NOTE — Patient Instructions (Signed)
Dr Sallyanne Kuster has recommended making the following medication changes: 1. INCREASE Furosemide - take 60 mg (1.5 tablets) in the morning and take 40 mg (1 tablet) in the evening 2. START Claritin 10 mg daily 3. CHANGE Potassium to KDUR - take 30 mEq (3 tablets) daily  Your physician recommends that you schedule a follow-up appointment in 3 months with a pacemaker check.  If you need a refill on your cardiac medications before your next appointment, please call your pharmacy.

## 2017-01-04 ENCOUNTER — Telehealth: Payer: Self-pay | Admitting: Cardiovascular Disease

## 2017-01-04 NOTE — Telephone Encounter (Signed)
Patient notified that we are currently out of samples and will call him when some are available.

## 2017-01-04 NOTE — Telephone Encounter (Signed)
New message      Patient calling the office for samples of medication:   1.  What medication and dosage are you requesting samples for? 5mg  eliquis   2.  Are you currently out of this medication?  Yes

## 2017-01-05 NOTE — Telephone Encounter (Signed)
Returned call to patient's wife left message Eliquis 5 mg samples left at front desk.

## 2017-01-08 ENCOUNTER — Telehealth: Payer: Self-pay | Admitting: Cardiovascular Disease

## 2017-01-08 ENCOUNTER — Other Ambulatory Visit: Payer: Self-pay | Admitting: Cardiovascular Disease

## 2017-01-08 NOTE — Telephone Encounter (Signed)
Patient calling the office for samples of medication: ° ° °1.  What medication and dosage are you requesting samples for? Eliquis 5 mg  ° °2.  Are you currently out of this medication? yes ° ° °

## 2017-01-08 NOTE — Telephone Encounter (Signed)
°  New Prob  Patient calling the office for samples of medication:   1.  What medication and dosage are you requesting samples for?  Eliquis 5 mg  2.  Are you currently out of this medication?  No   Pt uses sign language interpreter. Please call and confirm if samples are available or not in the office.

## 2017-01-08 NOTE — Telephone Encounter (Signed)
Left message via sign language interpreter-no samples available call back and check again next week

## 2017-01-11 NOTE — Telephone Encounter (Signed)
Leave message letting pt know samples was place at front desk.[name]

## 2017-01-19 ENCOUNTER — Ambulatory Visit (INDEPENDENT_AMBULATORY_CARE_PROVIDER_SITE_OTHER): Payer: Medicare Other | Admitting: *Deleted

## 2017-01-19 DIAGNOSIS — I5022 Chronic systolic (congestive) heart failure: Secondary | ICD-10-CM | POA: Diagnosis not present

## 2017-01-19 NOTE — Progress Notes (Signed)
Remote ICD transmission.   

## 2017-01-21 ENCOUNTER — Encounter: Payer: Self-pay | Admitting: Cardiology

## 2017-01-23 LAB — CUP PACEART REMOTE DEVICE CHECK
Battery Remaining Longevity: 24 mo
Battery Voltage: 2.95 V
Brady Statistic RA Percent Paced: 0 %
Date Time Interrogation Session: 20181106083424
HIGH POWER IMPEDANCE MEASURED VALUE: 58 Ohm
Implantable Lead Implant Date: 20000210
Implantable Lead Implant Date: 20000210
Implantable Lead Location: 753858
Implantable Lead Model: 4298
Implantable Lead Serial Number: 413633
Implantable Pulse Generator Implant Date: 20141003
Lead Channel Impedance Value: 266 Ohm
Lead Channel Impedance Value: 380 Ohm
Lead Channel Impedance Value: 551 Ohm
Lead Channel Impedance Value: 570 Ohm
Lead Channel Impedance Value: 570 Ohm
Lead Channel Impedance Value: 646 Ohm
Lead Channel Pacing Threshold Pulse Width: 0.6 ms
Lead Channel Sensing Intrinsic Amplitude: 0.875 mV
Lead Channel Sensing Intrinsic Amplitude: 0.875 mV
Lead Channel Sensing Intrinsic Amplitude: 7 mV
Lead Channel Sensing Intrinsic Amplitude: 7 mV
Lead Channel Setting Pacing Amplitude: 2 V
Lead Channel Setting Pacing Pulse Width: 0.4 ms
Lead Channel Setting Sensing Sensitivity: 0.3 mV
MDC IDC LEAD IMPLANT DT: 20141003
MDC IDC LEAD LOCATION: 753859
MDC IDC LEAD LOCATION: 753860
MDC IDC LEAD SERIAL: 272469
MDC IDC MSMT LEADCHNL LV IMPEDANCE VALUE: 323 Ohm
MDC IDC MSMT LEADCHNL LV IMPEDANCE VALUE: 323 Ohm
MDC IDC MSMT LEADCHNL LV IMPEDANCE VALUE: 342 Ohm
MDC IDC MSMT LEADCHNL LV IMPEDANCE VALUE: 437 Ohm
MDC IDC MSMT LEADCHNL LV IMPEDANCE VALUE: 570 Ohm
MDC IDC MSMT LEADCHNL LV PACING THRESHOLD AMPLITUDE: 1.25 V
MDC IDC MSMT LEADCHNL RA IMPEDANCE VALUE: 399 Ohm
MDC IDC MSMT LEADCHNL RV IMPEDANCE VALUE: 342 Ohm
MDC IDC MSMT LEADCHNL RV PACING THRESHOLD AMPLITUDE: 0.75 V
MDC IDC MSMT LEADCHNL RV PACING THRESHOLD PULSEWIDTH: 0.4 ms
MDC IDC SET LEADCHNL LV PACING AMPLITUDE: 2.25 V
MDC IDC SET LEADCHNL LV PACING PULSEWIDTH: 0.6 ms
MDC IDC STAT BRADY AP VP PERCENT: 0 %
MDC IDC STAT BRADY AP VS PERCENT: 0 %
MDC IDC STAT BRADY AS VP PERCENT: 97.98 %
MDC IDC STAT BRADY AS VS PERCENT: 2.02 %
MDC IDC STAT BRADY RV PERCENT PACED: 95.46 %

## 2017-02-18 ENCOUNTER — Telehealth: Payer: Self-pay | Admitting: Cardiovascular Disease

## 2017-02-18 NOTE — Telephone Encounter (Signed)
New Message   [phone] text her if you have them   Patient calling the office for samples of medication:   1.  What medication and dosage are you requesting samples for?  eliquis 5 mg   2.  Are you currently out of this medication?  yes

## 2017-02-18 NOTE — Telephone Encounter (Signed)
Left message for patient through sign language interrupter, no samples available, please let us know if you need a script called to the pharmacy.

## 2017-02-24 ENCOUNTER — Other Ambulatory Visit: Payer: Self-pay | Admitting: Internal Medicine

## 2017-02-24 NOTE — Telephone Encounter (Signed)
Patient calling the office for samples of medication:   1.  What medication and dosage are you requesting samples for? Eliquis  2.  Are you currently out of this medication? Completely out of it

## 2017-02-26 NOTE — Telephone Encounter (Signed)
Left message for pt letting him know samples of Eliquis left at front desk.

## 2017-03-01 NOTE — Progress Notes (Signed)
Cardiology Office Note    Date:  03/08/2017   ID:  Thomas, [name] 07/09/1934, MRN [id number]  PCP:  [name], MD  Cardiologist:  Cristopher Peru, MD (EP); Sanda Klein, MD   Chief Complaint  Patient presents with  . Follow-up CHF    pt reports no complaints    History of Present Illness:  Thomas [name] is a 82 y.o. male with chronic systolic heart failure related to ischemic cardiomyopathy, previous coronary bypass surgery and previous stent in SVG-OM for acute non-STEMI in 2015, long-term persistent atrial fibrillation, history of second degree Mobitz type II AV block, CRT-D Medtronic device implanted 2014.  As always, we need the services of a sign language interpreter.   The patient specifically denies any chest pain at rest exertion, dyspnea at rest or with exertion, orthopnea, paroxysmal nocturnal dyspnea, syncope, palpitations, focal neurological deficits, intermittent claudication, unexplained weight gain, cough, hemoptysis or wheezing.  He has persistent leg edema. Despite the prescription for furosemide twice daily, he only takes it once a day. Sometimes he will skip it because he doesn't want to mix it with acetaminophen. He does not like to go to the bathroom frequently.  His weight is minimally up compared to baseline dry weight of approximately 225 lb.  Device interrogation shows normal function. The presenting rhythm is biventricular pacing at about 70 bpm. He has only had a handful of episodes of nonsustained VT, all brief (max 4 seconds). V sensing events are infrequent and brief, longest only 10 seconds. BiV pacing is approximately 97%. Activity remains excellent at roughly 4 hours per day and constant over the last year. Generator longevity is 1.9 years. The left ventricular lead threshold is stable at 1.25 V at 0.6 ms. Heart rate histogram distribution is appropriate. The Optivol trend is stable.  He had bypass surgery in 1995. Left ventricle systolic  function deteriorated in 2014 without options for revascularization by cardiac catheterization and he underwent implantation of his CRT-D in October 2014. He had substantial clinical improvement and left ventricular EF also improved to 45-50%. In July 2015 he had an acute non-STEMI with placement of a stent to the SVG to OM (bare-metal 3.512 mm MultiLink vision). Native coronary arteries were occluded, LIMA to LAD was patent with a 50-70% stenosis at the anastomosis and chronic total occlusion of SVG-RCA.  Past Medical History:  Diagnosis Date  . Biventricular ICD (implantable cardioverter-defibrillator) in place 03/24/2005   Implantation of a Medtronic Adapta ADDRO1, serial number [id number]  . CHF (congestive heart failure) ([place])   . CKD (chronic kidney disease), stage III ([place])   . Coronary artery disease    a. s/p CABG 1986. b. Multiple PCIs/caths. c. 09/2013: s/p PTCA and BMS to SVG-OM.  [name]   . History of abdominal aortic aneurysm   . History of bleeding peptic ulcer 1980  . History of epididymitis 2013  . HTN (hypertension)   . Hydronephrosis with ureteropelvic junction obstruction   . Hydroureter on left 2009  . Hypertension   . Ischemic cardiomyopathy    a. Prior EF 30-35%, s/p BIV-ICD. b. 09/2013: EF 45-50%.  . Moderate tricuspid regurgitation   . PAF (paroxysmal atrial fibrillation) ([place])   . Prostate cancer ([place])   . Status post coronary artery bypass grafting 1986   LIMA to the LAD, SVG to OM, SVG to RCA  . Testicular swelling     Past Surgical History:  Procedure Laterality Date  . 2-D echocardiogram  11/20/2011  Ejection fraction 30-35% moderate concentric left ventricular hypertrophy. Left atrium is moderately dilated. Mild MR. Mild or  . BI-VENTRICULAR IMPLANTABLE CARDIOVERTER DEFIBRILLATOR N/A 12/16/2012   Procedure: BI-VENTRICULAR IMPLANTABLE CARDIOVERTER DEFIBRILLATOR  (CRT-D);  Surgeon: [name], MD;  Location: [facility] CATH LAB;  Service: Cardiovascular;   Laterality: N/A;  . CARDIAC CATHETERIZATION  12/10/2011   SVG to OM widely patent.  LIMA to LAD patent  . CORONARY ARTERY BYPASS GRAFT  1986  . INSERT / REPLACE / REMOVE PACEMAKER    . LEFT HEART CATHETERIZATION WITH CORONARY/GRAFT ANGIOGRAM N/A 12/10/2011   Procedure: LEFT HEART CATHETERIZATION WITH Beatrix Fetters;  Surgeon: Sanda Klein, MD;  Location: [facility] CATH LAB;  Service: Cardiovascular;  Laterality: N/A;  . LEFT HEART CATHETERIZATION WITH CORONARY/GRAFT ANGIOGRAM N/A 09/25/2013   Procedure: LEFT HEART CATHETERIZATION WITH Beatrix Fetters;  Surgeon: Blane Ohara, MD;  Location: [facility] CATH LAB;  Service: Cardiovascular;  Laterality: N/A;  . Persantine Myoview  05/06/2010   Post-rest ejection fraction 30%. No significant ischemia demonstrated. Compared to previous study there is no significant change.  . TRANSURETHRAL RESECTION OF PROSTATE     s/p    Current Medications: Outpatient Medications Prior to Visit  Medication Sig Dispense Refill  . albuterol (PROVENTIL HFA;VENTOLIN HFA) 108 (90 Base) MCG/ACT inhaler 2 puffs q.i.d. p.r.n. short of breath, wheezing, or cough    . carvedilol (COREG) 12.5 MG tablet Take 1 tablet (12.5 mg total) by mouth 2 (two) times daily with a meal. 180 tablet 3  . ELIQUIS 5 MG TABS tablet TAKE 1 TABLET (5 MG TOTAL) BY MOUTH 2 (TWO) TIMES DAILY. 60 tablet 5  . isosorbide mononitrate (IMDUR) 60 MG 24 hr tablet TAKE 1.5 TABLETS (90 MG TOTAL) BY MOUTH DAILY. 45 tablet 5  . loratadine (CLARITIN) 10 MG tablet Take 1 tablet (10 mg total) by mouth daily. 90 tablet 3  . losartan-hydrochlorothiazide (HYZAAR) 50-12.5 MG tablet TAKE 1 TABLET BY MOUTH EVERY DAY **NEED OFFICE VISIT** 30 tablet 9  . potassium chloride (K-DUR) 10 MEQ tablet Take 3 tablets (30 mEq total) by mouth daily. 270 tablet 3  . simvastatin (ZOCOR) 20 MG tablet TAKE 1 TABLET BY MOUTH DAILY 30 tablet 6  . tamsulosin (FLOMAX) 0.4 MG CAPS capsule Take 0.4 mg by mouth daily.  3  .  vitamin B-12 (CYANOCOBALAMIN) 500 MCG tablet Take 500 mcg by mouth daily.    . furosemide (LASIX) 40 MG tablet Take 1.5 tablets (60 mg total) by mouth every morning and take 1 tablet (40 mg total) by mouth every evening. 225 tablet 3   No facility-administered medications prior to visit.      Allergies:   Phenazopyridine and Ramipril   Social History   Socioeconomic History  . Marital status: Married    Spouse name: None  . Number of children: None  . Years of education: None  . Highest education level: None  Social Needs  . Financial resource strain: None  . Food insecurity - worry: None  . Food insecurity - inability: None  . Transportation needs - medical: None  . Transportation needs - non-medical: None  Occupational History  . None  Tobacco Use  . Smoking status: Former Smoker    Last attempt to quit: 03/15/1985    Years since quitting: 32.0  . Smokeless tobacco: Never Used  Substance and Sexual Activity  . Alcohol use: No    Comment: occas.  . Drug use: No  . Sexual activity: Not Currently  Other Topics Concern  .  None  Social History Narrative  . None     Family History:  The patient's family history includes Hypertension in his father.   ROS:   Please see the history of present illness.    ROS All other systems reviewed and are negative.   PHYSICAL EXAM:   VS:  BP (!) 122/58   Pulse 68   Ht 6\' 2"  (1.88 m)   Wt 226 lb 12.8 oz (102.9 kg)   BMI 29.12 kg/m    General: Alert, oriented x3, no distress, smiling, appears well Head: no evidence of trauma, PERRL, EOMI, no exophtalmos or lid lag, no myxedema, no xanthelasma; normal ears, nose and oropharynx Neck: normal jugular venous pulsations and no hepatojugular reflux; brisk carotid pulses without delay and no carotid bruits Chest: clear to auscultation, no signs of consolidation by percussion or palpation, normal fremitus, symmetrical and full respiratory excursions Cardiovascular: normal position and  quality of the apical impulse, regular rhythm, normal first and paradoxically split S2, RRR; 3/6 holosystolic murmur heard throughout the entire precordium, no diastolic murmurs, rubs or gallops Abdomen: no tenderness or distention, no masses by palpation, no abnormal pulsatility or arterial bruits, normal bowel sounds, no hepatosplenomegaly Extremities: no clubbing, cyanosis , 2+ symmetrical pretibial edema; 2+ radial, ulnar and brachial pulses bilaterally; 2+ right femoral, posterior tibial and dorsalis pedis pulses; 2+ left femoral, posterior tibial and dorsalis pedis pulses; no subclavian or femoral bruits Neurological: grossly nonfocal. Congenital deafness. Psych: Normal mood and affect  Wt Readings from Last 3 Encounters:  03/08/17 226 lb 12.8 oz (102.9 kg)  11/30/16 231 lb (104.8 kg)  10/26/16 228 lb (103.4 kg)      Studies/Labs Reviewed:   EKG:  EKG is not ordered today.    Recent Labs: 07/15/2016: ALT 19; Hemoglobin 13.4; Platelets 133 08/24/2016: BUN 32; Creatinine, Ser 1.27; Potassium 4.7; Sodium 140   Lipid Panel    Component Value Date/Time   CHOL 92 11/01/2015 0331   CHOL 139 11/20/2011 0624   TRIG 76 11/01/2015 0331   TRIG 108 11/20/2011 0624   HDL 31 (L) 11/01/2015 0331   HDL 26 (L) 11/20/2011 0624   CHOLHDL 3.0 11/01/2015 0331   VLDL 15 11/01/2015 0331   VLDL 22 11/20/2011 0624   LDLCALC 46 11/01/2015 0331   LDLCALC 91 11/20/2011 0624     ASSESSMENT:    1. Chronic systolic heart failure ([place])   2. Coronary artery disease involving coronary bypass graft of native heart without angina pectoris   3. Permanent atrial fibrillation ([place])   4. Long term current use of anticoagulant   5. Mobitz type II atrioventricular block   6. Biventricular ICD Medtronic Viva single chamber October 2014   7. Pure hypercholesterolemia   8. Benign essential HTN   9. Moderate tricuspid regurgitation   10. Bilateral deafness   11. Medication management     PLAN:  In order of  problems listed above:  1. CHF: Functional class 1-2 status, weight and thoracic impedance pattern suggest that he is euvolemic. I think persistent edema in his legs is related to peripheral venous insufficiency. Recommended compression stockings. Moderate ischemic cardiomyopathy, most recent estimated LVEF 45%. Reminded him about the need for sodium restriction and how to monitor his weight on a daily basis. He has been taking furosemide only once daily. Will change his prescription to 80 mg once a day 2. CAD: He has not had angina pectoris. He is status post CABG and PCI of SVG to OM. Patent  LIMA to LAD. Occluded native arteries and occluded SVG to RCA. He is on 2 antianginal medications: Beta blockers and long-acting nitrates 3. AFib: Rate control is good.CHADSVasc 7 (age 72, TIA 2, HTN, CAD, CHF). 4. Eliquis anticoagulation. No bleeding problems. Previous difficulty with warfarin anticoagulation.  5. High-grade AV block: He is not device dependent, but is 97% BiV paced. 6. CRT-D (Medtronic Viva quad XT)  With normal device function, next appointment with Dr. [name] November.  7. Hyperlipidemia on statin, time to recheck. 8. HTN: excellent blood pressure control, no symptoms of hypotension.  9. TR: His echo last September shows severe dilation of the right ventricle with mildly reduced right ventricular systolic function. Tricuspid regurgitation was described as being only mild to moderate. His murmur is quite prominent and I suspect he has at least moderate TR based on clinical criteria. 10.  Congenital deafness - always needs a sign language interpreter  Medication Adjustments/Labs and Tests Ordered: Current medicines are reviewed at length with the patient today.  Concerns regarding medicines are outlined above.  Medication changes, Labs and Tests ordered today are listed in the Patient Instructions below.  Patient Instructions  Dr Sallyanne Kuster has recommended making the following medication  changes: 1. INCREASE Furpsemide (Lasix) to 80 mg - take 1 tablet by mouth in the morning  Remote monitoring is used to monitor your Pacemaker of ICD from home. This monitoring reduces the number of office visits required to check your device to one time per year. It allows [place] to keep an eye on the functioning of your device to ensure it is working properly. You are scheduled for a device check from home on Tuesday, February 5th, 2019. You may send your transmission at any time that day. If you have a wireless device, the transmission will be sent automatically. After your physician reviews your transmission, you will receive a postcard with your next transmission date.  Dr Sallyanne Kuster recommends that you schedule a follow-up appointment in 6 months with an ICD check. You will receive a reminder letter in the mail two months in advance. If you don't receive a letter, please call our office to schedule the follow-up appointment.  If you need a refill on your cardiac medications before your next appointment, please call your pharmacy.   Signed, Sanda Klein, MD  03/08/2017 9:51 AM    [facility] [address], [place], [place]  [address] Phone: [phone]; Fax: [phone]

## 2017-03-02 LAB — CUP PACEART INCLINIC DEVICE CHECK
Battery Remaining Longevity: 27 mo
Brady Statistic AP VP Percent: 0 %
Brady Statistic AP VS Percent: 0 %
Brady Statistic RA Percent Paced: 0 %
Brady Statistic RV Percent Paced: 97.67 %
HighPow Impedance: 72 Ohm
Implantable Lead Implant Date: 20141003
Implantable Lead Location: 753859
Implantable Lead Model: 4285
Implantable Lead Model: 4298
Lead Channel Impedance Value: 342 Ohm
Lead Channel Impedance Value: 342 Ohm
Lead Channel Impedance Value: 399 Ohm
Lead Channel Impedance Value: 437 Ohm
Lead Channel Impedance Value: 456 Ohm
Lead Channel Pacing Threshold Amplitude: 0.75 V
Lead Channel Pacing Threshold Pulse Width: 0.4 ms
Lead Channel Sensing Intrinsic Amplitude: 4.5 mV
Lead Channel Setting Pacing Amplitude: 2 V
Lead Channel Setting Pacing Amplitude: 2.25 V
Lead Channel Setting Pacing Pulse Width: 0.6 ms
MDC IDC LEAD IMPLANT DT: 20000210
MDC IDC LEAD IMPLANT DT: 20000210
MDC IDC LEAD LOCATION: 753858
MDC IDC LEAD LOCATION: 753860
MDC IDC LEAD SERIAL: 272469
MDC IDC LEAD SERIAL: 413633
MDC IDC MSMT BATTERY VOLTAGE: 2.95 V
MDC IDC MSMT LEADCHNL LV IMPEDANCE VALUE: 646 Ohm
MDC IDC MSMT LEADCHNL LV IMPEDANCE VALUE: 646 Ohm
MDC IDC MSMT LEADCHNL LV IMPEDANCE VALUE: 665 Ohm
MDC IDC MSMT LEADCHNL LV IMPEDANCE VALUE: 665 Ohm
MDC IDC MSMT LEADCHNL LV IMPEDANCE VALUE: 722 Ohm
MDC IDC MSMT LEADCHNL LV PACING THRESHOLD AMPLITUDE: 1.25 V
MDC IDC MSMT LEADCHNL LV PACING THRESHOLD PULSEWIDTH: 0.6 ms
MDC IDC MSMT LEADCHNL RA IMPEDANCE VALUE: 399 Ohm
MDC IDC MSMT LEADCHNL RA SENSING INTR AMPL: 0.875 mV
MDC IDC MSMT LEADCHNL RA SENSING INTR AMPL: 0.875 mV
MDC IDC MSMT LEADCHNL RV IMPEDANCE VALUE: 285 Ohm
MDC IDC MSMT LEADCHNL RV IMPEDANCE VALUE: 399 Ohm
MDC IDC MSMT LEADCHNL RV SENSING INTR AMPL: 4.5 mV
MDC IDC PG IMPLANT DT: 20141003
MDC IDC SESS DTM: 20180917131014
MDC IDC SET LEADCHNL RV PACING PULSEWIDTH: 0.4 ms
MDC IDC SET LEADCHNL RV SENSING SENSITIVITY: 0.3 mV
MDC IDC STAT BRADY AS VP PERCENT: 96.18 %
MDC IDC STAT BRADY AS VS PERCENT: 3.82 %

## 2017-03-08 ENCOUNTER — Ambulatory Visit (INDEPENDENT_AMBULATORY_CARE_PROVIDER_SITE_OTHER): Payer: Medicare Other | Admitting: Cardiovascular Disease

## 2017-03-08 ENCOUNTER — Encounter: Payer: Self-pay | Admitting: Cardiovascular Disease

## 2017-03-08 VITALS — BP 122/58 | HR 68 | Ht 74.0 in | Wt 226.8 lb

## 2017-03-08 DIAGNOSIS — Z95810 Presence of automatic (implantable) cardiac defibrillator: Secondary | ICD-10-CM

## 2017-03-08 DIAGNOSIS — Z7901 Long term (current) use of anticoagulants: Secondary | ICD-10-CM | POA: Diagnosis not present

## 2017-03-08 DIAGNOSIS — I25810 Atherosclerosis of coronary artery bypass graft(s) without angina pectoris: Secondary | ICD-10-CM

## 2017-03-08 DIAGNOSIS — I10 Essential (primary) hypertension: Secondary | ICD-10-CM | POA: Diagnosis not present

## 2017-03-08 DIAGNOSIS — I255 Ischemic cardiomyopathy: Secondary | ICD-10-CM

## 2017-03-08 DIAGNOSIS — I482 Chronic atrial fibrillation: Secondary | ICD-10-CM | POA: Diagnosis not present

## 2017-03-08 DIAGNOSIS — I441 Atrioventricular block, second degree: Secondary | ICD-10-CM

## 2017-03-08 DIAGNOSIS — I5022 Chronic systolic (congestive) heart failure: Secondary | ICD-10-CM

## 2017-03-08 DIAGNOSIS — Z79899 Other long term (current) drug therapy: Secondary | ICD-10-CM | POA: Diagnosis not present

## 2017-03-08 DIAGNOSIS — I071 Rheumatic tricuspid insufficiency: Secondary | ICD-10-CM

## 2017-03-08 DIAGNOSIS — H9193 Unspecified hearing loss, bilateral: Secondary | ICD-10-CM

## 2017-03-08 DIAGNOSIS — I4821 Permanent atrial fibrillation: Secondary | ICD-10-CM

## 2017-03-08 DIAGNOSIS — E7800 Pure hypercholesterolemia, unspecified: Secondary | ICD-10-CM

## 2017-03-08 MED ORDER — FUROSEMIDE 80 MG PO TABS: 80.0000 mg | ORAL_TABLET | Freq: Every day | ORAL | 3 refills | Status: DC

## 2017-03-08 NOTE — Patient Instructions (Signed)
Dr Sallyanne Kuster has recommended making the following medication changes: 1. INCREASE Furpsemide (Lasix) to 80 mg - take 1 tablet by mouth in the morning  Remote monitoring is used to monitor your Pacemaker of ICD from home. This monitoring reduces the number of office visits required to check your device to one time per year. It allows [place] to keep an eye on the functioning of your device to ensure it is working properly. You are scheduled for a device check from home on Tuesday, February 5th, 2019. You may send your transmission at any time that day. If you have a wireless device, the transmission will be sent automatically. After your physician reviews your transmission, you will receive a postcard with your next transmission date.  Dr Sallyanne Kuster recommends that you schedule a follow-up appointment in 6 months with an ICD check. You will receive a reminder letter in the mail two months in advance. If you don't receive a letter, please call our office to schedule the follow-up appointment.  If you need a refill on your cardiac medications before your next appointment, please call your pharmacy.

## 2017-03-11 ENCOUNTER — Other Ambulatory Visit: Payer: Self-pay | Admitting: Internal Medicine

## 2017-04-20 ENCOUNTER — Ambulatory Visit (INDEPENDENT_AMBULATORY_CARE_PROVIDER_SITE_OTHER): Payer: Medicare Other | Admitting: *Deleted

## 2017-04-20 DIAGNOSIS — I255 Ischemic cardiomyopathy: Secondary | ICD-10-CM

## 2017-04-20 DIAGNOSIS — I441 Atrioventricular block, second degree: Secondary | ICD-10-CM | POA: Diagnosis not present

## 2017-04-20 DIAGNOSIS — I5022 Chronic systolic (congestive) heart failure: Secondary | ICD-10-CM

## 2017-04-20 NOTE — Progress Notes (Signed)
Remote ICD transmission.   

## 2017-04-21 ENCOUNTER — Encounter: Payer: Self-pay | Admitting: Cardiology

## 2017-04-26 LAB — CUP PACEART INCLINIC DEVICE CHECK
Battery Remaining Longevity: 23 mo
Battery Voltage: 2.94 V
Brady Statistic AP VP Percent: 0 %
Brady Statistic AP VS Percent: 0 %
Brady Statistic AS VP Percent: 96.68 %
Brady Statistic AS VS Percent: 3.32 %
Brady Statistic RA Percent Paced: 0 %
Brady Statistic RV Percent Paced: 95.74 %
Date Time Interrogation Session: 20181224145727
HighPow Impedance: 80 Ohm
Implantable Lead Implant Date: 20000210
Implantable Lead Implant Date: 20000210
Implantable Lead Implant Date: 20141003
Implantable Lead Location: 753858
Implantable Lead Location: 753859
Implantable Lead Location: 753860
Implantable Lead Model: 4244
Implantable Lead Model: 4285
Implantable Lead Model: 4298
Implantable Lead Serial Number: 272469
Implantable Lead Serial Number: 413633
Implantable Pulse Generator Implant Date: 20141003
Lead Channel Impedance Value: 323 Ohm
Lead Channel Impedance Value: 380 Ohm
Lead Channel Impedance Value: 380 Ohm
Lead Channel Impedance Value: 399 Ohm
Lead Channel Impedance Value: 399 Ohm
Lead Channel Impedance Value: 437 Ohm
Lead Channel Impedance Value: 456 Ohm
Lead Channel Impedance Value: 456 Ohm
Lead Channel Impedance Value: 665 Ohm
Lead Channel Impedance Value: 703 Ohm
Lead Channel Impedance Value: 703 Ohm
Lead Channel Impedance Value: 703 Ohm
Lead Channel Impedance Value: 779 Ohm
Lead Channel Pacing Threshold Amplitude: 0.75 V
Lead Channel Pacing Threshold Amplitude: 1.25 V
Lead Channel Pacing Threshold Pulse Width: 0.4 ms
Lead Channel Pacing Threshold Pulse Width: 0.6 ms
Lead Channel Sensing Intrinsic Amplitude: 0.875 mV
Lead Channel Sensing Intrinsic Amplitude: 0.875 mV
Lead Channel Sensing Intrinsic Amplitude: 7.625 mV
Lead Channel Sensing Intrinsic Amplitude: 7.625 mV
Lead Channel Setting Pacing Amplitude: 2 V
Lead Channel Setting Pacing Amplitude: 2.25 V
Lead Channel Setting Pacing Pulse Width: 0.4 ms
Lead Channel Setting Pacing Pulse Width: 0.6 ms
Lead Channel Setting Sensing Sensitivity: 0.3 mV

## 2017-04-30 ENCOUNTER — Telehealth: Payer: Self-pay | Admitting: Cardiovascular Disease

## 2017-04-30 NOTE — Telephone Encounter (Signed)
New message  Pt c/o swelling: STAT is pt has developed SOB within 24 hours  1) How much weight have you gained and in what time span? Not sure  2) If swelling, where is the swelling located? legs  3) Are you currently taking a fluid pill? yes  4) Are you currently SOB? no  5) Do you have a log of your daily weights (if so, list)? no  6) Have you gained 3 pounds in a day or 5 pounds in a week? Not sure  7) Have you traveled recently? no  Pt is also having some itchiness, hives, and a mixture of blood and water looking fluids coming from the swollen area

## 2017-04-30 NOTE — Telephone Encounter (Signed)
Patient called via an interpreter. The patient has broken out in hives along both of his legs. The patient stated that he has been scratching his legs and they are now oozing blood and fluid. The redness and swelling has started to increase. He has been advised to go to an urgent care or fast med to have his legs evaluated due to the risk of infection. The patient has verbalized his understanding.

## 2017-05-12 LAB — CUP PACEART REMOTE DEVICE CHECK
Battery Voltage: 2.94 V
Brady Statistic AS VP Percent: 95.86 %
Brady Statistic RA Percent Paced: 0 %
Brady Statistic RV Percent Paced: 96.42 %
Date Time Interrogation Session: 20190205072705
HighPow Impedance: 72 Ohm
Implantable Lead Implant Date: 20000210
Implantable Lead Implant Date: 20000210
Implantable Lead Location: 753858
Implantable Lead Location: 753859
Implantable Lead Model: 4298
Implantable Lead Serial Number: 272469
Implantable Lead Serial Number: 413633
Implantable Pulse Generator Implant Date: 20141003
Lead Channel Impedance Value: 285 Ohm
Lead Channel Impedance Value: 380 Ohm
Lead Channel Impedance Value: 399 Ohm
Lead Channel Impedance Value: 570 Ohm
Lead Channel Impedance Value: 608 Ohm
Lead Channel Impedance Value: 608 Ohm
Lead Channel Impedance Value: 665 Ohm
Lead Channel Pacing Threshold Pulse Width: 0.6 ms
Lead Channel Sensing Intrinsic Amplitude: 0.875 mV
Lead Channel Sensing Intrinsic Amplitude: 0.875 mV
Lead Channel Sensing Intrinsic Amplitude: 7.625 mV
Lead Channel Sensing Intrinsic Amplitude: 7.625 mV
Lead Channel Setting Pacing Amplitude: 2 V
Lead Channel Setting Pacing Amplitude: 2.25 V
Lead Channel Setting Pacing Pulse Width: 0.4 ms
Lead Channel Setting Sensing Sensitivity: 0.3 mV
MDC IDC LEAD IMPLANT DT: 20141003
MDC IDC LEAD LOCATION: 753860
MDC IDC MSMT BATTERY REMAINING LONGEVITY: 24 mo
MDC IDC MSMT LEADCHNL LV IMPEDANCE VALUE: 342 Ohm
MDC IDC MSMT LEADCHNL LV IMPEDANCE VALUE: 342 Ohm
MDC IDC MSMT LEADCHNL LV IMPEDANCE VALUE: 380 Ohm
MDC IDC MSMT LEADCHNL LV IMPEDANCE VALUE: 437 Ohm
MDC IDC MSMT LEADCHNL LV IMPEDANCE VALUE: 608 Ohm
MDC IDC MSMT LEADCHNL LV PACING THRESHOLD AMPLITUDE: 1.25 V
MDC IDC MSMT LEADCHNL RV IMPEDANCE VALUE: 342 Ohm
MDC IDC MSMT LEADCHNL RV PACING THRESHOLD AMPLITUDE: 0.75 V
MDC IDC MSMT LEADCHNL RV PACING THRESHOLD PULSEWIDTH: 0.4 ms
MDC IDC SET LEADCHNL LV PACING PULSEWIDTH: 0.6 ms
MDC IDC STAT BRADY AP VP PERCENT: 0 %
MDC IDC STAT BRADY AP VS PERCENT: 0 %
MDC IDC STAT BRADY AS VS PERCENT: 4.14 %

## 2017-05-13 ENCOUNTER — Telehealth: Payer: Self-pay | Admitting: Cardiovascular Disease

## 2017-05-13 NOTE — Telephone Encounter (Signed)
Patient calling the office for samples of medication:   1.  What medication and dosage are you requesting samples for? Eliquis 5 mg   2.  Are you currently out of this medication? Low, has few pills left

## 2017-05-13 NOTE — Telephone Encounter (Signed)
Left message for patient samples at the front desk for pickup 

## 2017-05-13 NOTE — Telephone Encounter (Signed)
F/U Call:  Patient wife asks that you please leave message

## 2017-05-19 ENCOUNTER — Telehealth: Payer: Self-pay | Admitting: Cardiovascular Disease

## 2017-05-19 ENCOUNTER — Other Ambulatory Visit: Payer: Self-pay

## 2017-05-19 NOTE — Telephone Encounter (Signed)
Returned the call to the patient. Message left via interpretor that samples are at the front for him. These were left from 05/13/17.

## 2017-05-19 NOTE — Telephone Encounter (Signed)
New message   Patient calling the office for samples of medication:   1.  What medication and dosage are you requesting samples for?ELIQUIS 5 MG TABS tablet  2.  Are you currently out of this medication? yes   

## 2017-05-20 MED ORDER — APIXABAN 5 MG PO TABS: 5.0000 mg | ORAL_TABLET | Freq: Two times a day (BID) | ORAL | 2 refills | Status: DC

## 2017-05-25 ENCOUNTER — Other Ambulatory Visit: Payer: Self-pay | Admitting: Cardiovascular Disease

## 2017-05-25 ENCOUNTER — Telehealth: Payer: Self-pay | Admitting: Cardiovascular Disease

## 2017-05-25 NOTE — Telephone Encounter (Signed)
Message left on voicemail that we did not have samples available at this time for eliquis.

## 2017-05-25 NOTE — Telephone Encounter (Signed)
Patient calling the office for samples of medication: ° ° °1.  What medication and dosage are you requesting samples for?  Eliquis  ° °2.  Are you currently out of this medication? no ° ° °

## 2017-05-27 ENCOUNTER — Other Ambulatory Visit: Payer: Self-pay | Admitting: Internal Medicine

## 2017-05-27 ENCOUNTER — Telehealth: Payer: Self-pay | Admitting: Cardiovascular Disease

## 2017-05-27 NOTE — Telephone Encounter (Signed)
Patient calling the office for samples of medication: ° ° °1.  What medication and dosage are you requesting samples for?  Eliquis  ° °2.  Are you currently out of this medication? no ° ° °

## 2017-05-27 NOTE — Telephone Encounter (Signed)
Wrong provider

## 2017-05-29 ENCOUNTER — Other Ambulatory Visit: Payer: Self-pay | Admitting: Cardiovascular Disease

## 2017-05-31 NOTE — Telephone Encounter (Signed)
Pt's wife came by [address] office asking for samples of Eliquis. I explained to the wife that we did not have any samples of Eliquis, but I did give the wife an patient assistant application for pt to fill out, because pt has been getting samples for a long time and I explained to the wife that we give samples to pts that are just starting this medication and that she needed to complete the form and return it to Dr. Victorino December nurse, because the pt is Dr. Victorino December pt. I informed the wife that she could call back tomorrow to both offices, to see if we have gotten any samples in. Pt's wife understood by writing all this information on paper, because pt's wife is deaf.

## 2017-06-01 NOTE — Telephone Encounter (Signed)
Called patient and informed him that we do not currently have any samples of Eliquis at this time; interpreter informed patient and patient stated thank you!

## 2017-06-02 ENCOUNTER — Telehealth: Payer: Self-pay

## 2017-06-02 ENCOUNTER — Other Ambulatory Visit: Payer: Self-pay | Admitting: Cardiovascular Disease

## 2017-06-02 NOTE — Telephone Encounter (Signed)
The pts wife dropped off the pts BMS Pt assistance application for Eliquis. Per the pts chart Eliquis is refilled under Dr Sallyanne Kuster as the pt sees him in our [facility] office. I have s/w Chelly, Dr Victorino December CMA, who asked that I fax the pts BMS Pt assistance application to her at [phone] which I have done.  I also called and left a detailed message on the pts VM explaining that I have faxed his application to Dr [employer] office and that if he has any questions concerning his application to contact our [facility] office.

## 2017-06-15 ENCOUNTER — Other Ambulatory Visit: Payer: Self-pay

## 2017-06-15 MED ORDER — APIXABAN 5 MG PO TABS: 5.0000 mg | ORAL_TABLET | Freq: Two times a day (BID) | ORAL | 3 refills | Status: DC

## 2017-07-06 ENCOUNTER — Other Ambulatory Visit: Payer: Self-pay | Admitting: Cardiovascular Disease

## 2017-07-06 NOTE — Telephone Encounter (Signed)
REFILL 

## 2017-07-08 NOTE — Telephone Encounter (Signed)
Rx printed for patient assistance application.

## 2017-07-12 ENCOUNTER — Ambulatory Visit (INDEPENDENT_AMBULATORY_CARE_PROVIDER_SITE_OTHER): Payer: Medicare Other | Admitting: Internal Medicine

## 2017-07-12 ENCOUNTER — Encounter: Payer: Self-pay | Admitting: Internal Medicine

## 2017-07-12 VITALS — BP 104/60 | HR 79 | Ht 74.0 in | Wt 238.4 lb

## 2017-07-12 DIAGNOSIS — I255 Ischemic cardiomyopathy: Secondary | ICD-10-CM | POA: Diagnosis not present

## 2017-07-12 DIAGNOSIS — Z95810 Presence of automatic (implantable) cardiac defibrillator: Secondary | ICD-10-CM | POA: Diagnosis not present

## 2017-07-12 DIAGNOSIS — I10 Essential (primary) hypertension: Secondary | ICD-10-CM

## 2017-07-12 DIAGNOSIS — I5022 Chronic systolic (congestive) heart failure: Secondary | ICD-10-CM | POA: Diagnosis not present

## 2017-07-12 LAB — CUP PACEART INCLINIC DEVICE CHECK
Battery Remaining Longevity: 23 mo
Battery Voltage: 2.92 V
Brady Statistic AP VS Percent: 0 %
Brady Statistic AS VS Percent: 4.38 %
HighPow Impedance: 66 Ohm
Implantable Lead Implant Date: 20000210
Implantable Lead Implant Date: 20141003
Implantable Lead Location: 753858
Implantable Lead Location: 753860
Implantable Lead Model: 4244
Implantable Lead Model: 4285
Implantable Lead Model: 4298
Implantable Lead Serial Number: 272469
Lead Channel Impedance Value: 342 Ohm
Lead Channel Impedance Value: 342 Ohm
Lead Channel Impedance Value: 399 Ohm
Lead Channel Impedance Value: 437 Ohm
Lead Channel Impedance Value: 570 Ohm
Lead Channel Impedance Value: 570 Ohm
Lead Channel Impedance Value: 570 Ohm
Lead Channel Impedance Value: 570 Ohm
Lead Channel Impedance Value: 665 Ohm
Lead Channel Pacing Threshold Amplitude: 1.125 V
Lead Channel Pacing Threshold Pulse Width: 0.6 ms
Lead Channel Sensing Intrinsic Amplitude: 0.875 mV
Lead Channel Sensing Intrinsic Amplitude: 0.875 mV
Lead Channel Setting Pacing Amplitude: 2 V
MDC IDC LEAD IMPLANT DT: 20000210
MDC IDC LEAD LOCATION: 753859
MDC IDC LEAD SERIAL: 413633
MDC IDC MSMT LEADCHNL LV IMPEDANCE VALUE: 323 Ohm
MDC IDC MSMT LEADCHNL LV IMPEDANCE VALUE: 342 Ohm
MDC IDC MSMT LEADCHNL LV IMPEDANCE VALUE: 380 Ohm
MDC IDC MSMT LEADCHNL RV IMPEDANCE VALUE: 285 Ohm
MDC IDC MSMT LEADCHNL RV PACING THRESHOLD AMPLITUDE: 0.75 V
MDC IDC MSMT LEADCHNL RV PACING THRESHOLD PULSEWIDTH: 0.4 ms
MDC IDC MSMT LEADCHNL RV SENSING INTR AMPL: 7.625 mV
MDC IDC PG IMPLANT DT: 20141003
MDC IDC SESS DTM: 20190429094214
MDC IDC SET LEADCHNL LV PACING AMPLITUDE: 2.25 V
MDC IDC SET LEADCHNL LV PACING PULSEWIDTH: 0.6 ms
MDC IDC SET LEADCHNL RV PACING PULSEWIDTH: 0.4 ms
MDC IDC SET LEADCHNL RV SENSING SENSITIVITY: 0.3 mV
MDC IDC STAT BRADY AP VP PERCENT: 0 %
MDC IDC STAT BRADY AS VP PERCENT: 95.62 %
MDC IDC STAT BRADY RA PERCENT PACED: 0 %
MDC IDC STAT BRADY RV PERCENT PACED: 96.64 %

## 2017-07-12 NOTE — Patient Instructions (Addendum)
Medication Instructions:  Your physician has recommended you make the following change in your medication: 1.  INCREASE your LASIX for the next THREE DAYS-take 1.5 tablets by mouth daily.  After 3 days return to one tablet daily.  Labwork: None ordered.  Testing/Procedures: None ordered.  Follow-Up: Your physician wants you to follow-up in: one year with Dr. [name].   You will receive a reminder letter in the mail two months in advance. If you don't receive a letter, please call our office to schedule the follow-up appointment.  Remote monitoring is used to monitor your ICD from home. This monitoring reduces the number of office visits required to check your device to one time per year. It allows [place] to keep an eye on the functioning of your device to ensure it is working properly. You are scheduled for a device check from home on 07/20/2017. You may send your transmission at any time that day. If you have a wireless device, the transmission will be sent automatically. After your physician reviews your transmission, you will receive a postcard with your next transmission date.  Any Other Special Instructions Will Be Listed Below (If Applicable).  If you need a refill on your cardiac medications before your next appointment, please call your pharmacy.

## 2017-07-12 NOTE — Progress Notes (Addendum)
HPI Mr. Dupras returns today for followup. He is a pleasant 83 yo man with an ICM, chronic systolic CHF, LBBB, EF 56%. He underwent BiV ICD implant over 4 years ago. In the interim, the patient has had more peripheral edema and admits to not taking his lasix. He has had no syncope. He denies chest pain. He does have peripheral edema. No ICD shock. He remains active. He admits to some dietary indiscretion. He is still working.   Allergies  Allergen Reactions  . Phenazopyridine Nausea Only and Other (See Comments)    Other Reaction: GI UPSET  . Ramipril Other (See Comments)     Current Outpatient Medications  Medication Sig Dispense Refill  . albuterol (PROVENTIL HFA;VENTOLIN HFA) 108 (90 Base) MCG/ACT inhaler 2 puffs q.i.d. p.r.n. short of breath, wheezing, or cough    . apixaban (ELIQUIS) 5 MG TABS tablet Take 1 tablet (5 mg total) by mouth 2 (two) times daily. 180 tablet 3  . carvedilol (COREG) 12.5 MG tablet Take 1 tablet (12.5 mg total) by mouth 2 (two) times daily with a meal. 180 tablet 3  . furosemide (LASIX) 80 MG tablet Take 1 tablet (80 mg total) by mouth daily. 90 tablet 3  . isosorbide mononitrate (IMDUR) 60 MG 24 hr tablet TAKE 1.5 TABLETS (90 MG TOTAL) BY MOUTH DAILY. 45 tablet 3  . loratadine (CLARITIN) 10 MG tablet Take 1 tablet (10 mg total) by mouth daily. 90 tablet 3  . losartan-hydrochlorothiazide (HYZAAR) 50-12.5 MG tablet TAKE 1 TABLET BY MOUTH EVERY DAY **NEED OFFICE VISIT** 30 tablet 9  . losartan-hydrochlorothiazide (HYZAAR) 50-12.5 MG tablet TAKE 1 TABLET BY MOUTH EVERY DAY 30 tablet 6  . potassium chloride (K-DUR) 10 MEQ tablet Take 3 tablets (30 mEq total) by mouth daily. 270 tablet 3  . simvastatin (ZOCOR) 20 MG tablet TAKE 1 TABLET BY MOUTH DAILY 90 tablet 2  . tamsulosin (FLOMAX) 0.4 MG CAPS capsule Take 0.4 mg by mouth daily.  3  . vitamin B-12 (CYANOCOBALAMIN) 500 MCG tablet Take 500 mcg by mouth daily.     No current facility-administered  medications for this visit.      Past Medical History:  Diagnosis Date  . Biventricular ICD (implantable cardioverter-defibrillator) in place 03/24/2005   Implantation of a Medtronic Adapta ADDRO1, serial number [id number]  . CHF (congestive heart failure) ([place])   . CKD (chronic kidney disease), stage III ([place])   . Coronary artery disease    a. s/p CABG 1986. b. Multiple PCIs/caths. c. 09/2013: s/p PTCA and BMS to SVG-OM.  [name]   . History of abdominal aortic aneurysm   . History of bleeding peptic ulcer 1980  . History of epididymitis 2013  . HTN (hypertension)   . Hydronephrosis with ureteropelvic junction obstruction   . Hydroureter on left 2009  . Hypertension   . Ischemic cardiomyopathy    a. Prior EF 30-35%, s/p BIV-ICD. b. 09/2013: EF 45-50%.  . Moderate tricuspid regurgitation   . PAF (paroxysmal atrial fibrillation) ([place])   . Prostate cancer ([place])   . Status post coronary artery bypass grafting 1986   LIMA to the LAD, SVG to OM, SVG to RCA  . Testicular swelling     ROS:   All systems reviewed and negative except as noted in the HPI.   Past Surgical History:  Procedure Laterality Date  . 2-D echocardiogram  11/20/2011   Ejection fraction 30-35% moderate concentric left ventricular hypertrophy. Left atrium is moderately dilated.  Mild MR. Mild or  . BI-VENTRICULAR IMPLANTABLE CARDIOVERTER DEFIBRILLATOR N/A 12/16/2012   Procedure: BI-VENTRICULAR IMPLANTABLE CARDIOVERTER DEFIBRILLATOR  (CRT-D);  Surgeon: [name], MD;  Location: [facility] CATH LAB;  Service: Cardiovascular;  Laterality: N/A;  . CARDIAC CATHETERIZATION  12/10/2011   SVG to OM widely patent.  LIMA to LAD patent  . CORONARY ARTERY BYPASS GRAFT  1986  . INSERT / REPLACE / REMOVE PACEMAKER    . LEFT HEART CATHETERIZATION WITH CORONARY/GRAFT ANGIOGRAM N/A 12/10/2011   Procedure: LEFT HEART CATHETERIZATION WITH Beatrix Fetters;  Surgeon: Sanda Klein, MD;  Location: [facility] CATH LAB;  Service:  Cardiovascular;  Laterality: N/A;  . LEFT HEART CATHETERIZATION WITH CORONARY/GRAFT ANGIOGRAM N/A 09/25/2013   Procedure: LEFT HEART CATHETERIZATION WITH Beatrix Fetters;  Surgeon: Blane Ohara, MD;  Location: [facility] CATH LAB;  Service: Cardiovascular;  Laterality: N/A;  . Persantine Myoview  05/06/2010   Post-rest ejection fraction 30%. No significant ischemia demonstrated. Compared to previous study there is no significant change.  . TRANSURETHRAL RESECTION OF PROSTATE     s/p     Family History  Problem Relation Age of Onset  . Hypertension Father      Social History   Socioeconomic History  . Marital status: Married    Spouse name: Not on file  . Number of children: Not on file  . Years of education: Not on file  . Highest education level: Not on file  Occupational History  . Not on file  Social Needs  . Financial resource strain: Not on file  . Food insecurity:    Worry: Not on file    Inability: Not on file  . Transportation needs:    Medical: Not on file    Non-medical: Not on file  Tobacco Use  . Smoking status: Former Smoker    Last attempt to quit: 03/15/1985    Years since quitting: 32.3  . Smokeless tobacco: Never Used  Substance and Sexual Activity  . Alcohol use: No    Comment: occas.  . Drug use: No  . Sexual activity: Not Currently  Lifestyle  . Physical activity:    Days per week: Not on file    Minutes per session: Not on file  . Stress: Not on file  Relationships  . Social connections:    Talks on phone: Not on file    Gets together: Not on file    Attends religious service: Not on file    Active member of club or organization: Not on file    Attends meetings of clubs or organizations: Not on file    Relationship status: Not on file  . Intimate partner violence:    Fear of current or ex partner: Not on file    Emotionally abused: Not on file    Physically abused: Not on file    Forced sexual activity: Not on file  Other Topics  Concern  . Not on file  Social History Narrative  . Not on file     BP 104/60   Pulse 79   Ht 6\' 2"  (1.88 m)   Wt 238 lb 6.4 oz (108.1 kg)   SpO2 95%   BMI 30.61 kg/m   Physical Exam:  Well appearing 83 yo man, NAD HEENT: Unremarkable Neck:  6 cm JVD, no thyromegally Lymphatics:  No adenopathy Back:  No CVA tenderness Lungs:  Clear with no wheezes HEART:  Regular rate rhythm, no murmurs, no rubs, no clicks Abd:  soft, positive bowel sounds, no  organomegally, no rebound, no guarding Ext:  2 plus pulses, 1+ edema, no cyanosis, no clubbing Skin:  No rashes no nodules Neuro:  CN II through XII intact, motor grossly intact  EKG -atrial fib with biv pacing  DEVICE  Normal device function.  See PaceArt for details.   Assess/Plan: 1. Chronic systolic heart failure - his fluid volume is up. He admits to non-compliance with his meds. I have asked that the patient increase his lasix for 3 days and then take it as directed which he has not been doing.  2. ICD - his Medtronic device is working normally.  3. Atrial fib - his ventricular rate is well controlled. Will follow. 4. HTN - his blood pressure has been well controlled. We will follow and he is encourated not to miss his meds.  Mikle Bosworth.D.

## 2017-07-20 ENCOUNTER — Ambulatory Visit (INDEPENDENT_AMBULATORY_CARE_PROVIDER_SITE_OTHER): Payer: Medicare Other | Admitting: *Deleted

## 2017-07-20 ENCOUNTER — Telehealth: Payer: Self-pay | Admitting: Cardiology

## 2017-07-20 DIAGNOSIS — I5022 Chronic systolic (congestive) heart failure: Secondary | ICD-10-CM

## 2017-07-20 DIAGNOSIS — I255 Ischemic cardiomyopathy: Secondary | ICD-10-CM

## 2017-07-20 NOTE — Telephone Encounter (Signed)
LMOVM reminding pt to send remote transmission.   

## 2017-07-21 NOTE — Progress Notes (Signed)
Remote ICD transmission.   

## 2017-07-22 ENCOUNTER — Encounter: Payer: Self-pay | Admitting: Cardiology

## 2017-08-06 LAB — CUP PACEART REMOTE DEVICE CHECK
Battery Remaining Longevity: 22 mo
Brady Statistic AP VP Percent: 0 %
Brady Statistic AP VS Percent: 0 %
Brady Statistic AS VS Percent: 5.71 %
Date Time Interrogation Session: 20190508144759
HighPow Impedance: 84 Ohm
Implantable Lead Implant Date: 20000210
Implantable Lead Implant Date: 20141003
Implantable Lead Location: 753859
Implantable Lead Location: 753860
Implantable Lead Model: 4244
Implantable Lead Model: 4285
Implantable Lead Serial Number: 272469
Implantable Lead Serial Number: 413633
Implantable Pulse Generator Implant Date: 20141003
Lead Channel Impedance Value: 285 Ohm
Lead Channel Impedance Value: 380 Ohm
Lead Channel Impedance Value: 380 Ohm
Lead Channel Impedance Value: 399 Ohm
Lead Channel Impedance Value: 456 Ohm
Lead Channel Impedance Value: 456 Ohm
Lead Channel Impedance Value: 703 Ohm
Lead Channel Impedance Value: 722 Ohm
Lead Channel Impedance Value: 779 Ohm
Lead Channel Pacing Threshold Amplitude: 1.25 V
Lead Channel Pacing Threshold Pulse Width: 0.6 ms
Lead Channel Sensing Intrinsic Amplitude: 0.875 mV
Lead Channel Sensing Intrinsic Amplitude: 0.875 mV
Lead Channel Setting Pacing Amplitude: 2 V
Lead Channel Setting Pacing Pulse Width: 0.6 ms
Lead Channel Setting Sensing Sensitivity: 0.3 mV
MDC IDC LEAD IMPLANT DT: 20000210
MDC IDC LEAD LOCATION: 753858
MDC IDC MSMT BATTERY VOLTAGE: 2.92 V
MDC IDC MSMT LEADCHNL LV IMPEDANCE VALUE: 380 Ohm
MDC IDC MSMT LEADCHNL LV IMPEDANCE VALUE: 437 Ohm
MDC IDC MSMT LEADCHNL LV IMPEDANCE VALUE: 703 Ohm
MDC IDC MSMT LEADCHNL LV IMPEDANCE VALUE: 722 Ohm
MDC IDC MSMT LEADCHNL RV PACING THRESHOLD AMPLITUDE: 0.625 V
MDC IDC MSMT LEADCHNL RV PACING THRESHOLD PULSEWIDTH: 0.4 ms
MDC IDC MSMT LEADCHNL RV SENSING INTR AMPL: 7.625 mV
MDC IDC MSMT LEADCHNL RV SENSING INTR AMPL: 7.625 mV
MDC IDC SET LEADCHNL LV PACING AMPLITUDE: 2.25 V
MDC IDC SET LEADCHNL RV PACING PULSEWIDTH: 0.4 ms
MDC IDC STAT BRADY AS VP PERCENT: 94.29 %
MDC IDC STAT BRADY RA PERCENT PACED: 0 %
MDC IDC STAT BRADY RV PERCENT PACED: 98.03 %

## 2017-08-29 ENCOUNTER — Emergency Department
Admission: EM | Admit: 2017-08-29 | Discharge: 2017-08-29 | Disposition: A | Discharge status: Home / Self Care | Payer: Medicare Other | Attending: Emergency Medicine | Admitting: Emergency Medicine

## 2017-08-29 ENCOUNTER — Other Ambulatory Visit: Payer: Self-pay

## 2017-08-29 DIAGNOSIS — Y929 Unspecified place or not applicable: Secondary | ICD-10-CM | POA: Insufficient documentation

## 2017-08-29 DIAGNOSIS — I130 Hypertensive heart and chronic kidney disease with heart failure and stage 1 through stage 4 chronic kidney disease, or unspecified chronic kidney disease: Secondary | ICD-10-CM | POA: Insufficient documentation

## 2017-08-29 DIAGNOSIS — Z79899 Other long term (current) drug therapy: Secondary | ICD-10-CM | POA: Diagnosis not present

## 2017-08-29 DIAGNOSIS — W228XXA Striking against or struck by other objects, initial encounter: Secondary | ICD-10-CM | POA: Insufficient documentation

## 2017-08-29 DIAGNOSIS — Z87891 Personal history of nicotine dependence: Secondary | ICD-10-CM | POA: Diagnosis not present

## 2017-08-29 DIAGNOSIS — I5022 Chronic systolic (congestive) heart failure: Secondary | ICD-10-CM | POA: Diagnosis not present

## 2017-08-29 DIAGNOSIS — L089 Local infection of the skin and subcutaneous tissue, unspecified: Secondary | ICD-10-CM

## 2017-08-29 DIAGNOSIS — Z950 Presence of cardiac pacemaker: Secondary | ICD-10-CM | POA: Insufficient documentation

## 2017-08-29 DIAGNOSIS — Y999 Unspecified external cause status: Secondary | ICD-10-CM | POA: Diagnosis not present

## 2017-08-29 DIAGNOSIS — I2510 Atherosclerotic heart disease of native coronary artery without angina pectoris: Secondary | ICD-10-CM | POA: Insufficient documentation

## 2017-08-29 DIAGNOSIS — S99911A Unspecified injury of right ankle, initial encounter: Secondary | ICD-10-CM | POA: Diagnosis present

## 2017-08-29 DIAGNOSIS — S90511A Abrasion, right ankle, initial encounter: Secondary | ICD-10-CM | POA: Insufficient documentation

## 2017-08-29 DIAGNOSIS — Z23 Encounter for immunization: Secondary | ICD-10-CM | POA: Insufficient documentation

## 2017-08-29 DIAGNOSIS — Y939 Activity, unspecified: Secondary | ICD-10-CM | POA: Diagnosis not present

## 2017-08-29 DIAGNOSIS — N183 Chronic kidney disease, stage 3 (moderate): Secondary | ICD-10-CM | POA: Diagnosis not present

## 2017-08-29 MED ORDER — CEPHALEXIN 500 MG PO CAPS: 500.0000 mg | ORAL_CAPSULE | Freq: Three times a day (TID) | ORAL | 0 refills | Status: DC

## 2017-08-29 MED ORDER — TETANUS-DIPHTH-ACELL PERTUSSIS 5-2.5-18.5 LF-MCG/0.5 IM SUSP
0.5000 mL | Freq: Once | INTRAMUSCULAR | Status: AC
  Administered 2017-08-29: 0.5 mL via INTRAMUSCULAR
  Filled 2017-08-29: qty 0.5

## 2017-08-29 MED ORDER — MUPIROCIN CALCIUM 2 % EX CREA: 1.0000 "application " | TOPICAL_CREAM | Freq: Two times a day (BID) | CUTANEOUS | 0 refills | Status: DC

## 2017-08-29 NOTE — ED Triage Notes (Addendum)
ASL interpreter used in triage on the [place].   States blister on leg that was scraped by boxes and is weeping. Started earlier this week. Denies it being bloody. R leg, covered with a band-aid, appears saturated.  Denies fever at home.

## 2017-08-29 NOTE — Discharge Instructions (Addendum)
Clean the area with soap and water daily.  Apply the Bactroban cream twice a day.  Change dressing as needed.  Take the Keflex 3 times a day as prescribed.  If you are worsening please return the emergency department

## 2017-08-29 NOTE — ED Notes (Signed)
See triage note  States he developed a blister to right lowe leg about 3 days ago  Now area is draining  No fever  Swelling noted to lower ext

## 2017-08-29 NOTE — ED Provider Notes (Signed)
[facility] Emergency Department Provider Note  ____________________________________________   First MD Initiated Contact with Patient 08/29/17 1640     (approximate)  I have reviewed the triage vital signs and the nursing notes.   HISTORY  Chief Complaint Blister    HPI Thomas [name] is a 83 y.o. male presents emergency department complaining of a wound to the right lower leg.  He states he brushed his leg up against a box and the skin became open.  He has had drainage from the area since.  States it is watery and weeping.  He denies any fever or chills.  Denies any other swelling of the extremities.  He states he does get Band-Aids on it but keeps soaking through the Band-Aid.  He is unsure the last tetanus.  All of this information was gathered from the ASL interpreter on strata  Past Medical History:  Diagnosis Date  . Biventricular ICD (implantable cardioverter-defibrillator) in place 03/24/2005   Implantation of a Medtronic Adapta ADDRO1, serial number [id number]  . CHF (congestive heart failure) ([place])   . CKD (chronic kidney disease), stage III ([place])   . Coronary artery disease    a. s/p CABG 1986. b. Multiple PCIs/caths. c. 09/2013: s/p PTCA and BMS to SVG-OM.  [name]   . History of abdominal aortic aneurysm   . History of bleeding peptic ulcer 1980  . History of epididymitis 2013  . HTN (hypertension)   . Hydronephrosis with ureteropelvic junction obstruction   . Hydroureter on left 2009  . Hypertension   . Ischemic cardiomyopathy    a. Prior EF 30-35%, s/p BIV-ICD. b. 09/2013: EF 45-50%.  . Moderate tricuspid regurgitation   . PAF (paroxysmal atrial fibrillation) ([place])   . Prostate cancer ([place])   . Status post coronary artery bypass grafting 1986   LIMA to the LAD, SVG to OM, SVG to RCA  . Testicular swelling     Patient Active Problem List   Diagnosis Date Noted  . Demand ischemia ([place]) 07/15/2016  . Weakness 07/15/2016  . Fatigue  07/15/2016  . Diarrhea 07/15/2016  . URI (upper respiratory infection) 07/15/2016  . CVA (cerebral infarction) 10/31/2015  . Bulbous urethral stricture 09/18/2015  . Bilateral deafness 08/19/2015  . Bilateral cataracts 08/19/2015  . Acid reflux 08/19/2015  . HLD (hyperlipidemia) 08/19/2015  . BP (high blood pressure) 08/19/2015  . Myocardial infarction ([place]) 08/19/2015  . Calculus of kidney 08/19/2015  . Artificial cardiac pacemaker 08/19/2015  . Gastroduodenal ulcer 08/19/2015  . Dupuytren's contracture of foot 08/19/2015  . Malignant neoplasm of prostate ([place]) 08/19/2015  . Microhematuria 08/19/2015  . Coronary artery disease 02/22/2015  . Status post coronary artery bypass grafting 02/22/2015  . Benign essential HTN 04/02/2014  . Hematochezia 02/20/2014  . Moderate tricuspid regurgitation 02/20/2014  . Mobitz type II atrioventricular block 12/12/2013  . Long term current use of anticoagulant 10/18/2013  . Acute myocardial infarction, subendocardial infarction, initial episode of care ([place]) 09/21/2013  . Cardiomyopathy, ischemic: Ejection fraction 45% 11/11/2012  . CAD Status post coronary artery bypass grafting: 1995 11/11/2012  . Permanent atrial fibrillation ([place]) 11/11/2012  . Biventricular ICD Medtronic Viva single chamber October 2014 11/11/2012  . Chronic systolic heart failure ([place]) 11/11/2012    Past Surgical History:  Procedure Laterality Date  . 2-D echocardiogram  11/20/2011   Ejection fraction 30-35% moderate concentric left ventricular hypertrophy. Left atrium is moderately dilated. Mild MR. Mild or  . BI-VENTRICULAR IMPLANTABLE CARDIOVERTER DEFIBRILLATOR N/A 12/16/2012  Procedure: BI-VENTRICULAR IMPLANTABLE CARDIOVERTER DEFIBRILLATOR  (CRT-D);  Surgeon: [name], MD;  Location: [facility] CATH LAB;  Service: Cardiovascular;  Laterality: N/A;  . CARDIAC CATHETERIZATION  12/10/2011   SVG to OM widely patent.  LIMA to LAD patent  . CORONARY ARTERY BYPASS GRAFT   1986  . INSERT / REPLACE / REMOVE PACEMAKER    . LEFT HEART CATHETERIZATION WITH CORONARY/GRAFT ANGIOGRAM N/A 12/10/2011   Procedure: LEFT HEART CATHETERIZATION WITH Beatrix Fetters;  Surgeon: Sanda Klein, MD;  Location: [facility] CATH LAB;  Service: Cardiovascular;  Laterality: N/A;  . LEFT HEART CATHETERIZATION WITH CORONARY/GRAFT ANGIOGRAM N/A 09/25/2013   Procedure: LEFT HEART CATHETERIZATION WITH Beatrix Fetters;  Surgeon: Blane Ohara, MD;  Location: [facility] CATH LAB;  Service: Cardiovascular;  Laterality: N/A;  . Persantine Myoview  05/06/2010   Post-rest ejection fraction 30%. No significant ischemia demonstrated. Compared to previous study there is no significant change.  . TRANSURETHRAL RESECTION OF PROSTATE     s/p    Prior to Admission medications   Medication Sig Start Date End Date Taking? Authorizing Provider  albuterol (PROVENTIL HFA;VENTOLIN HFA) 108 (90 Base) MCG/ACT inhaler 2 puffs q.i.d. p.r.n. short of breath, wheezing, or cough 10/30/16   [provider]  apixaban (ELIQUIS) 5 MG TABS tablet Take 1 tablet (5 mg total) by mouth 2 (two) times daily. 06/15/17   Croitoru, Mihai, MD  carvedilol (COREG) 12.5 MG tablet Take 1 tablet (12.5 mg total) by mouth 2 (two) times daily with a meal. 09/07/16   Croitoru, Mihai, MD  cephALEXin (KEFLEX) 500 MG capsule Take 1 capsule (500 mg total) by mouth 3 (three) times daily. 08/29/17   Rilea Arutyunyan, Linden Dolin, PA-C  furosemide (LASIX) 80 MG tablet Take 1 tablet (80 mg total) by mouth daily. 03/08/17   Croitoru, Mihai, MD  isosorbide mononitrate (IMDUR) 60 MG 24 hr tablet TAKE 1.5 TABLETS (90 MG TOTAL) BY MOUTH DAILY. 07/06/17   Croitoru, Mihai, MD  loratadine (CLARITIN) 10 MG tablet Take 1 tablet (10 mg total) by mouth daily. 11/30/16   Croitoru, [name] Gobble, MD  losartan-hydrochlorothiazide (HYZAAR) 50-12.5 MG tablet TAKE 1 TABLET BY MOUTH EVERY DAY **NEED OFFICE VISIT** 07/02/16   [name], MD  losartan-hydrochlorothiazide [facility])  50-12.5 MG tablet TAKE 1 TABLET BY MOUTH EVERY DAY 05/25/17   Croitoru, Mihai, MD  mupirocin cream (BACTROBAN) 2 % Apply 1 application topically 2 (two) times daily. 08/29/17   Briele Lagasse, Linden Dolin, PA-C  potassium chloride (K-DUR) 10 MEQ tablet Take 3 tablets (30 mEq total) by mouth daily. 11/30/16   Croitoru, Mihai, MD  simvastatin (ZOCOR) 20 MG tablet TAKE 1 TABLET BY MOUTH DAILY 05/31/17   Croitoru, Mihai, MD  tamsulosin (FLOMAX) 0.4 MG CAPS capsule Take 0.4 mg by mouth daily. 10/24/15   [provider]  vitamin B-12 (CYANOCOBALAMIN) 500 MCG tablet Take 500 mcg by mouth daily.    [provider]    Allergies Phenazopyridine and Ramipril  Family History  Problem Relation Age of Onset  . Hypertension Father     Social History Social History   Tobacco Use  . Smoking status: Former Smoker    Last attempt to quit: 03/15/1985    Years since quitting: 32.4  . Smokeless tobacco: Never Used  Substance Use Topics  . Alcohol use: No    Comment: occas.  . Drug use: No    Review of Systems  Constitutional: No fever/chills Eyes: No visual changes. ENT: No sore throat. Respiratory: Denies cough Genitourinary: Negative for dysuria.  Musculoskeletal: Negative for back pain. Skin: Negative for rash.  Positive for open wound to the right lower leg    ____________________________________________   PHYSICAL EXAM:  VITAL SIGNS: ED Triage Vitals [08/29/17 1634]  Enc Vitals Group     BP 131/69     Pulse Rate 85     Resp 18     Temp 98 F (36.7 C)     Temp Source Oral     SpO2 93 %     Weight 235 lb (106.6 kg)     Height 6\' 2"  (1.88 m)     Head Circumference      Peak Flow      Pain Score 0     Pain Loc      Pain Edu?      Excl. in [place]?     Constitutional: Alert and oriented. Well appearing and in no acute distress. Eyes: Conjunctivae are normal.  Head: Atraumatic. Nose: No congestion/rhinnorhea. Mouth/Throat: Mucous membranes are moist.   Cardiovascular:  Normal rate, regular rhythm. Respiratory: Normal respiratory effort.  No retractions GU: deferred Musculoskeletal: FROM all extremities, warm and well perfused Neurologic:  Normal speech and language.  Skin:  Skin is warm, dry .  The wound on the right lower leg is linear and about 5 inches long with some weeping noted.  Some of the weeping is watery and some is serosanguineous.  He is neurovascularly intact.   Psychiatric: Mood and affect are normal. Speech and behavior are normal.  ____________________________________________   LABS (all labs ordered are listed, but only abnormal results are displayed)  Labs Reviewed - No data to display ____________________________________________   ____________________________________________  RADIOLOGY    ____________________________________________   PROCEDURES  Procedure(s) performed: Dressing was applied by nursing staff, Tdap was given by nursing staff  Procedures    ____________________________________________   INITIAL IMPRESSION / [place] / ED COURSE  Pertinent labs & imaging results that were available during my care of the patient were reviewed by me and considered in my medical decision making (see chart for details).  Patient is an 83 year old male presents emergency department complaining of a wound to the right lower leg.  On physical exam there is a large abrasion with open weeping of the wound.  The area was cleaned and dressed with an ABD pad.  Patient is follow-up with his regular doctor in 3 days for recheck.  He was given Tdap while here in the emergency department.  He was given Bactroban ointment to apply topically, Keflex 500 mg 3 times a day for infection.  He is to return to the emergency department if worsening.  He states he understands will comply with instructions.  He was discharged in stable condition.  All information was gathered by video interpreter for ASL     As part of my medical  decision making, I reviewed the following data within the [place] History obtained from family, Nursing notes reviewed and incorporated, Interpreter needed, Old chart reviewed, Notes from prior ED visits and [place] Controlled Substance Database  ____________________________________________   FINAL CLINICAL IMPRESSION(S) / ED DIAGNOSES  Final diagnoses:  Abrasion of ankle with infection, right, initial encounter      NEW MEDICATIONS STARTED DURING THIS VISIT:  Discharge Medication List as of 08/29/2017  5:00 PM    START taking these medications   Details  cephALEXin (KEFLEX) 500 MG capsule Take 1 capsule (500 mg total) by mouth 3 (three) times daily., Starting Sun  08/29/2017, Print    mupirocin cream (BACTROBAN) 2 % Apply 1 application topically 2 (two) times daily., Starting Sun 08/29/2017, Print         Note:  This document was prepared using Dragon voice recognition software and may include unintentional dictation errors.    Versie Starks, PA-C 08/29/17 Tamala Fothergill, [place], MD 08/29/17 2312

## 2017-09-30 ENCOUNTER — Other Ambulatory Visit: Payer: Self-pay | Admitting: Cardiovascular Disease

## 2017-09-30 NOTE — Telephone Encounter (Signed)
Rx sent to pharmacy   

## 2017-10-04 ENCOUNTER — Other Ambulatory Visit: Payer: Self-pay | Admitting: Cardiovascular Disease

## 2017-10-05 ENCOUNTER — Encounter: Payer: Self-pay | Admitting: *Deleted

## 2017-10-12 ENCOUNTER — Ambulatory Visit: Payer: Medicare Other | Admitting: Anesthesiology

## 2017-10-12 ENCOUNTER — Ambulatory Visit
Admission: RE | Admit: 2017-10-12 | Discharge: 2017-10-12 | Disposition: A | Discharge status: Home / Self Care | Payer: Medicare Other | Source: Ambulatory Visit | Attending: Ophthalmology | Admitting: Ophthalmology

## 2017-10-12 ENCOUNTER — Other Ambulatory Visit: Payer: Self-pay

## 2017-10-12 ENCOUNTER — Encounter: Payer: Self-pay | Admitting: Ophthalmology

## 2017-10-12 ENCOUNTER — Encounter
Admission: RE | Disposition: A | Discharge status: Home / Self Care | Payer: Self-pay | Source: Ambulatory Visit | Attending: Ophthalmology

## 2017-10-12 DIAGNOSIS — I2510 Atherosclerotic heart disease of native coronary artery without angina pectoris: Secondary | ICD-10-CM | POA: Insufficient documentation

## 2017-10-12 DIAGNOSIS — H9190 Unspecified hearing loss, unspecified ear: Secondary | ICD-10-CM | POA: Insufficient documentation

## 2017-10-12 DIAGNOSIS — E7800 Pure hypercholesterolemia, unspecified: Secondary | ICD-10-CM | POA: Diagnosis not present

## 2017-10-12 DIAGNOSIS — Z950 Presence of cardiac pacemaker: Secondary | ICD-10-CM | POA: Diagnosis not present

## 2017-10-12 DIAGNOSIS — H2511 Age-related nuclear cataract, right eye: Secondary | ICD-10-CM | POA: Insufficient documentation

## 2017-10-12 DIAGNOSIS — Z79899 Other long term (current) drug therapy: Secondary | ICD-10-CM | POA: Diagnosis not present

## 2017-10-12 DIAGNOSIS — Z951 Presence of aortocoronary bypass graft: Secondary | ICD-10-CM | POA: Insufficient documentation

## 2017-10-12 DIAGNOSIS — Z7901 Long term (current) use of anticoagulants: Secondary | ICD-10-CM | POA: Insufficient documentation

## 2017-10-12 HISTORY — PX: CATARACT EXTRACTION W/PHACO: SHX586

## 2017-10-12 HISTORY — DX: Cardiac arrhythmia, unspecified: I49.9

## 2017-10-12 HISTORY — DX: Presence of automatic (implantable) cardiac defibrillator: Z95.810

## 2017-10-12 SURGERY — PHACOEMULSIFICATION, CATARACT, WITH IOL INSERTION
Anesthesia: Monitor Anesthesia Care | Site: Eye | Laterality: Right | Wound class: "Clean "

## 2017-10-12 MED ORDER — LIDOCAINE HCL (PF) 4 % IJ SOLN
INTRAMUSCULAR | Status: AC
  Filled 2017-10-12: qty 5

## 2017-10-12 MED ORDER — EPINEPHRINE PF 1 MG/ML IJ SOLN
INTRAMUSCULAR | Status: DC | PRN
  Administered 2017-10-12: 1 mL via OPHTHALMIC

## 2017-10-12 MED ORDER — EPINEPHRINE PF 1 MG/ML IJ SOLN
INTRAMUSCULAR | Status: AC
  Filled 2017-10-12: qty 1

## 2017-10-12 MED ORDER — MIDAZOLAM HCL 2 MG/2ML IJ SOLN
INTRAMUSCULAR | Status: DC | PRN
  Administered 2017-10-12: 0.5 mg via INTRAVENOUS

## 2017-10-12 MED ORDER — TETRACAINE HCL 0.5 % OP SOLN
1.0000 [drp] | Freq: Once | OPHTHALMIC | Status: AC
  Administered 2017-10-12 (×2): 1 [drp] via OPHTHALMIC

## 2017-10-12 MED ORDER — POVIDONE-IODINE 5 % OP SOLN
OPHTHALMIC | Status: AC
  Filled 2017-10-12: qty 30

## 2017-10-12 MED ORDER — NA CHONDROIT SULF-NA HYALURON 40-17 MG/ML IO SOLN
INTRAOCULAR | Status: AC
  Filled 2017-10-12: qty 1

## 2017-10-12 MED ORDER — FENTANYL CITRATE (PF) 100 MCG/2ML IJ SOLN
INTRAMUSCULAR | Status: DC | PRN
  Administered 2017-10-12: 25 ug via INTRAVENOUS

## 2017-10-12 MED ORDER — NA CHONDROIT SULF-NA HYALURON 40-17 MG/ML IO SOLN
INTRAOCULAR | Status: DC | PRN
  Administered 2017-10-12: 1 mL via INTRAOCULAR

## 2017-10-12 MED ORDER — MOXIFLOXACIN HCL 0.5 % OP SOLN
OPHTHALMIC | Status: DC | PRN
  Administered 2017-10-12: .2 mL via OPHTHALMIC

## 2017-10-12 MED ORDER — POVIDONE-IODINE 5 % OP SOLN
OPHTHALMIC | Status: DC | PRN
  Administered 2017-10-12: 1 via OPHTHALMIC

## 2017-10-12 MED ORDER — TETRACAINE HCL 0.5 % OP SOLN
OPHTHALMIC | Status: AC
  Administered 2017-10-12: 1 [drp] via OPHTHALMIC
  Filled 2017-10-12: qty 4

## 2017-10-12 MED ORDER — CYCLOPENTOLATE HCL 2 % OP SOLN
OPHTHALMIC | Status: AC
  Administered 2017-10-12: 1 [drp] via OPHTHALMIC
  Filled 2017-10-12: qty 2

## 2017-10-12 MED ORDER — LIDOCAINE HCL (PF) 4 % IJ SOLN
INTRAMUSCULAR | Status: DC | PRN
  Administered 2017-10-12: 2 mL via OPHTHALMIC

## 2017-10-12 MED ORDER — ONDANSETRON HCL 4 MG/2ML IJ SOLN: 4.0000 mg | Freq: Once | INTRAMUSCULAR | Status: DC | PRN

## 2017-10-12 MED ORDER — PHENYLEPHRINE HCL 10 % OP SOLN
OPHTHALMIC | Status: AC
  Administered 2017-10-12: 1 [drp] via OPHTHALMIC
  Filled 2017-10-12: qty 5

## 2017-10-12 MED ORDER — CARBACHOL 0.01 % IO SOLN
INTRAOCULAR | Status: DC | PRN
  Administered 2017-10-12: .5 mL via INTRAOCULAR

## 2017-10-12 MED ORDER — SODIUM CHLORIDE 0.9 % IV SOLN
INTRAVENOUS | Status: DC
  Administered 2017-10-12 (×2): via INTRAVENOUS

## 2017-10-12 MED ORDER — CYCLOPENTOLATE HCL 2 % OP SOLN
1.0000 [drp] | OPHTHALMIC | Status: AC
  Administered 2017-10-12 (×4): 1 [drp] via OPHTHALMIC

## 2017-10-12 MED ORDER — MOXIFLOXACIN HCL 0.5 % OP SOLN
OPHTHALMIC | Status: AC
  Filled 2017-10-12: qty 3

## 2017-10-12 MED ORDER — PHENYLEPHRINE HCL 10 % OP SOLN
1.0000 [drp] | OPHTHALMIC | Status: AC
  Administered 2017-10-12 (×4): 1 [drp] via OPHTHALMIC

## 2017-10-12 MED ORDER — MOXIFLOXACIN HCL 0.5 % OP SOLN: 1.0000 [drp] | OPHTHALMIC | Status: DC | PRN

## 2017-10-12 SURGICAL SUPPLY — 16 items
GLOVE BIO SURGEON STRL SZ8 (GLOVE) ×3 IMPLANT
GLOVE BIOGEL M 6.5 STRL (GLOVE) ×3 IMPLANT
GLOVE SURG LX 8.0 MICRO (GLOVE) ×2
GLOVE SURG LX STRL 8.0 MICRO (GLOVE) ×1 IMPLANT
GOWN STRL REUS W/ TWL LRG LVL3 (GOWN DISPOSABLE) ×2 IMPLANT
GOWN STRL REUS W/TWL LRG LVL3 (GOWN DISPOSABLE) ×4
LABEL CATARACT MEDS ST (LABEL) ×3 IMPLANT
LENS IOL TECNIS ITEC 19.5 (Intraocular Lens) ×2 IMPLANT
PACK CATARACT (MISCELLANEOUS) ×3 IMPLANT
PACK CATARACT BRASINGTON LX (MISCELLANEOUS) ×3 IMPLANT
PACK EYE AFTER SURG (MISCELLANEOUS) ×3 IMPLANT
SOL BSS BAG (MISCELLANEOUS) ×3
SOLUTION BSS BAG (MISCELLANEOUS) ×1 IMPLANT
SYR 5ML LL (SYRINGE) ×3 IMPLANT
WATER STERILE IRR 250ML POUR (IV SOLUTION) ×3 IMPLANT
WIPE NON LINTING 3.25X3.25 (MISCELLANEOUS) ×3 IMPLANT

## 2017-10-12 NOTE — Anesthesia Postprocedure Evaluation (Signed)
Anesthesia Post Note  Patient: Thomas [name]  Procedure(s) Performed: CATARACT EXTRACTION PHACO AND INTRAOCULAR LENS PLACEMENT (IOC) (Right Eye)  Patient location during evaluation: PACU Anesthesia Type: MAC Level of consciousness: awake and awake and alert Pain management: pain level controlled Vital Signs Assessment: post-procedure vital signs reviewed and stable Cardiovascular status: stable Anesthetic complications: no     Last Vitals:  Vitals:   10/12/17 0720 10/12/17 0840  BP: (!) 149/107 119/77  Pulse: 95 61  Resp: 17 16  Temp: 36.5 C 36.4 C  SpO2: 92% 96%    Last Pain:  Vitals:   10/12/17 0840  TempSrc: Temporal                 Bernardo Heater

## 2017-10-12 NOTE — Discharge Instructions (Addendum)
Eye Surgery Discharge Instructions  Expect mild scratchy sensation or mild soreness. DO NOT RUB YOUR EYE!  The day of surgery:  Minimal physical activity, but bed rest is not required  No reading, computer work, or close hand work  No bending, lifting, or straining.  May watch TV  For 24 hours:  No driving, legal decisions, or alcoholic beverages  Safety precautions  Eat anything you prefer: It is better to start with liquids, then soup then solid foods.  Solar shield eyeglasses should be worn for comfort in the sunlight/patch while sleepi.  Resume all regular medications including aspirin or Coumadin if these were discontinued prior to surgery. You may shower, bathe, shave, or wash your hair. Tylenol may be taken for mild discomfort. FOLLOW DR. PORFILIO'S POSTOP EYE DROP INSTRUCTION SHEET AS REVIEWED.  Call your doctor if you experience significant pain, nausea, or vomiting, fever > 101 or other signs of infection. [phone] or [phone] Specific instructions:  Follow-up Information    Birder Robson, MD Follow up.   Specialty:  Ophthalmology Why:  10/13/17 @ 10:50 AM Contact information: [address] [place] [address] [phone]

## 2017-10-12 NOTE — Anesthesia Preprocedure Evaluation (Signed)
Anesthesia Evaluation  Patient identified by MRN, date of birth, ID band Patient awake    Reviewed: Allergy & Precautions, NPO status , Patient's Chart, lab work & pertinent test results  History of Anesthesia Complications Negative for: history of anesthetic complications  Airway Mallampati: II       Dental   Pulmonary asthma , neg sleep apnea, neg COPD, former smoker,           Cardiovascular hypertension, Pt. on medications and Pt. on home beta blockers + CAD, + Past MI and +CHF  + dysrhythmias + pacemaker + Cardiac Defibrillator      Neuro/Psych neg Seizures    GI/Hepatic Neg liver ROS, PUD, neg GERD  ,  Endo/Other  neg diabetes  Renal/GU Renal InsufficiencyRenal disease     Musculoskeletal   Abdominal   Peds  Hematology   Anesthesia Other Findings   Reproductive/Obstetrics                             Anesthesia Physical Anesthesia Plan  ASA: III  Anesthesia Plan: MAC   Post-op Pain Management:    Induction: Intravenous  PONV Risk Score and Plan:   Airway Management Planned:   Additional Equipment:   Intra-op Plan:   Post-operative Plan:   Informed Consent: I have reviewed the patients History and Physical, chart, labs and discussed the procedure including the risks, benefits and alternatives for the proposed anesthesia with the patient or authorized representative who has indicated his/her understanding and acceptance.     Plan Discussed with:   Anesthesia Plan Comments:         Anesthesia Quick Evaluation

## 2017-10-12 NOTE — H&P (Signed)
All labs reviewed. Abnormal studies sent to patients PCP when indicated.  Previous H&P reviewed, patient examined, there are NO CHANGES.  Thomas Saxon Porfilio7/30/20198:12 AM

## 2017-10-12 NOTE — Anesthesia Post-op Follow-up Note (Signed)
Anesthesia QCDR form completed.        

## 2017-10-12 NOTE — Transfer of Care (Signed)
Immediate Anesthesia Transfer of Care Note  Patient: Thomas [name]  Procedure(s) Performed: CATARACT EXTRACTION PHACO AND INTRAOCULAR LENS PLACEMENT (IOC) (Right Eye)  Patient Location: PACU  Anesthesia Type:MAC  Level of Consciousness: awake, alert  and patient cooperative  Airway & Oxygen Therapy: Patient Spontanous Breathing  Post-op Assessment: Report given to RN and Post -op Vital signs reviewed and stable  Post vital signs: Reviewed and stable  Last Vitals:  Vitals Value Taken Time  BP 119/77 10/12/2017  8:40 AM  Temp 36.4 C 10/12/2017  8:40 AM  Pulse 61 10/12/2017  8:40 AM  Resp 16 10/12/2017  8:40 AM  SpO2 96 % 10/12/2017  8:40 AM    Last Pain:  Vitals:   10/12/17 0840  TempSrc: Temporal         Complications: No apparent anesthesia complications

## 2017-10-12 NOTE — Op Note (Signed)
PREOPERATIVE DIAGNOSIS:  Nuclear sclerotic cataract of the right eye.   POSTOPERATIVE DIAGNOSIS:  nuclear sclerotic cataract right eye   OPERATIVE PROCEDURE: Procedure(s): CATARACT EXTRACTION PHACO AND INTRAOCULAR LENS PLACEMENT (IOC)   SURGEON:  Birder Robson, MD.   ANESTHESIA:  Anesthesiologist: Gunnar Fusi, MD CRNA: Bernardo Heater, CRNA  1.      Managed anesthesia care. 2.      0.18ml of Shugarcaine was instilled in the eye following the paracentesis.   COMPLICATIONS:  None.   TECHNIQUE:   Stop and chop   DESCRIPTION OF PROCEDURE:  The patient was examined and consented in the preoperative holding area where the aforementioned topical anesthesia was applied to the right eye and then brought back to the Operating Room where the right eye was prepped and draped in the usual sterile ophthalmic fashion and a lid speculum was placed. A paracentesis was created with the side port blade and the anterior chamber was filled with viscoelastic. A near clear corneal incision was performed with the steel keratome. A continuous curvilinear capsulorrhexis was performed with a cystotome followed by the capsulorrhexis forceps. Hydrodissection and hydrodelineation were carried out with BSS on a blunt cannula. The lens was removed in a stop and chop  technique and the remaining cortical material was removed with the irrigation-aspiration handpiece. The capsular bag was inflated with viscoelastic and the Technis [id number]  lens was placed in the capsular bag without complication. The remaining viscoelastic was removed from the eye with the irrigation-aspiration handpiece. The wounds were hydrated. The anterior chamber was flushed with Miostat and the eye was inflated to physiologic pressure. 0.31ml of Vigamox was placed in the anterior chamber. The wounds were found to be water tight. The eye was dressed with Vigamox. The patient was given protective glasses to wear throughout the day and a shield with which  to sleep tonight. The patient was also given drops with which to begin a drop regimen today and will follow-up with me in one day. Implant Name Type Inv. Item Serial No. Manufacturer Lot No. LRB No. Used  LENS IOL DIOP 19.5 - [id number] Intraocular Lens LENS IOL DIOP 19.5 [id number] AMO  Right 1   Procedure(s) with comments: CATARACT EXTRACTION PHACO AND INTRAOCULAR LENS PLACEMENT (IOC) (Right) - [place] 00:57 AP% 15.9 CDE 9.07 Fluid pack lot # [id number]  Electronically signed: Birder Robson 10/12/2017 8:38 AM

## 2017-10-19 ENCOUNTER — Ambulatory Visit (INDEPENDENT_AMBULATORY_CARE_PROVIDER_SITE_OTHER): Payer: Medicare Other | Admitting: *Deleted

## 2017-10-19 DIAGNOSIS — I5022 Chronic systolic (congestive) heart failure: Secondary | ICD-10-CM

## 2017-10-19 DIAGNOSIS — I255 Ischemic cardiomyopathy: Secondary | ICD-10-CM | POA: Diagnosis not present

## 2017-10-20 NOTE — Progress Notes (Signed)
Remote ICD transmission.   

## 2017-10-25 ENCOUNTER — Ambulatory Visit: Payer: Medicare Other | Admitting: Urology

## 2017-10-25 ENCOUNTER — Encounter: Payer: Self-pay | Admitting: Urology

## 2017-11-09 LAB — CUP PACEART REMOTE DEVICE CHECK
Battery Remaining Longevity: 20 mo
Battery Voltage: 2.92 V
Brady Statistic AP VP Percent: 0 %
Brady Statistic AP VS Percent: 0 %
Brady Statistic RA Percent Paced: 0 %
Brady Statistic RV Percent Paced: 97.51 %
HighPow Impedance: 67 Ohm
Implantable Lead Implant Date: 20000210
Implantable Lead Implant Date: 20141003
Implantable Lead Location: 753859
Implantable Lead Location: 753860
Implantable Lead Model: 4244
Implantable Lead Model: 4285
Implantable Lead Model: 4298
Implantable Pulse Generator Implant Date: 20141003
Lead Channel Impedance Value: 266 Ohm
Lead Channel Impedance Value: 323 Ohm
Lead Channel Impedance Value: 323 Ohm
Lead Channel Impedance Value: 380 Ohm
Lead Channel Impedance Value: 437 Ohm
Lead Channel Impedance Value: 570 Ohm
Lead Channel Impedance Value: 570 Ohm
Lead Channel Impedance Value: 608 Ohm
Lead Channel Pacing Threshold Amplitude: 0.75 V
Lead Channel Pacing Threshold Pulse Width: 0.6 ms
Lead Channel Sensing Intrinsic Amplitude: 0.875 mV
Lead Channel Sensing Intrinsic Amplitude: 4.75 mV
Lead Channel Sensing Intrinsic Amplitude: 4.75 mV
Lead Channel Setting Pacing Amplitude: 2 V
Lead Channel Setting Pacing Pulse Width: 0.4 ms
Lead Channel Setting Pacing Pulse Width: 0.6 ms
MDC IDC LEAD IMPLANT DT: 20000210
MDC IDC LEAD LOCATION: 753858
MDC IDC LEAD SERIAL: 272469
MDC IDC LEAD SERIAL: 413633
MDC IDC MSMT LEADCHNL LV IMPEDANCE VALUE: 342 Ohm
MDC IDC MSMT LEADCHNL LV IMPEDANCE VALUE: 342 Ohm
MDC IDC MSMT LEADCHNL LV IMPEDANCE VALUE: 570 Ohm
MDC IDC MSMT LEADCHNL LV IMPEDANCE VALUE: 570 Ohm
MDC IDC MSMT LEADCHNL LV PACING THRESHOLD AMPLITUDE: 1.375 V
MDC IDC MSMT LEADCHNL RA IMPEDANCE VALUE: 399 Ohm
MDC IDC MSMT LEADCHNL RA SENSING INTR AMPL: 0.875 mV
MDC IDC MSMT LEADCHNL RV PACING THRESHOLD PULSEWIDTH: 0.4 ms
MDC IDC SESS DTM: 20190807022406
MDC IDC SET LEADCHNL LV PACING AMPLITUDE: 2.5 V
MDC IDC SET LEADCHNL RV SENSING SENSITIVITY: 0.3 mV
MDC IDC STAT BRADY AS VP PERCENT: 96.95 %
MDC IDC STAT BRADY AS VS PERCENT: 3.05 %

## 2017-11-29 ENCOUNTER — Other Ambulatory Visit: Payer: Self-pay | Admitting: Cardiovascular Disease

## 2017-11-29 ENCOUNTER — Ambulatory Visit: Payer: Medicare Other | Admitting: Urology

## 2017-12-13 ENCOUNTER — Encounter: Payer: Self-pay | Admitting: Urology

## 2017-12-13 ENCOUNTER — Ambulatory Visit (INDEPENDENT_AMBULATORY_CARE_PROVIDER_SITE_OTHER): Payer: Medicare Other | Admitting: Urology

## 2017-12-13 VITALS — BP 105/58 | HR 92 | Ht 74.0 in | Wt 239.8 lb

## 2017-12-13 DIAGNOSIS — R310 Gross hematuria: Secondary | ICD-10-CM | POA: Diagnosis not present

## 2017-12-13 DIAGNOSIS — I255 Ischemic cardiomyopathy: Secondary | ICD-10-CM

## 2017-12-13 DIAGNOSIS — N3946 Mixed incontinence: Secondary | ICD-10-CM | POA: Diagnosis not present

## 2017-12-13 LAB — URINALYSIS, COMPLETE
Bilirubin, UA: NEGATIVE
GLUCOSE, UA: NEGATIVE
KETONES UA: NEGATIVE
LEUKOCYTES UA: NEGATIVE
Nitrite, UA: NEGATIVE
PROTEIN UA: NEGATIVE
RBC, UA: NEGATIVE
SPEC GRAV UA: 1.025 (ref 1.005–1.030)
Urobilinogen, Ur: 0.2 mg/dL (ref 0.2–1.0)
pH, UA: 5 (ref 5.0–7.5)

## 2017-12-13 LAB — MICROSCOPIC EXAMINATION
Epithelial Cells (non renal): NONE SEEN /hpf (ref 0–10)
RBC, UA: NONE SEEN /hpf (ref 0–2)
WBC, UA: NONE SEEN /hpf (ref 0–5)

## 2017-12-13 LAB — BLADDER SCAN AMB NON-IMAGING

## 2017-12-13 MED ORDER — MIRABEGRON ER 50 MG PO TB24: 50.0000 mg | ORAL_TABLET | Freq: Every day | ORAL | 11 refills | Status: DC

## 2017-12-13 MED ORDER — TAMSULOSIN HCL 0.4 MG PO CAPS: 0.4000 mg | ORAL_CAPSULE | Freq: Every day | ORAL | 11 refills | Status: DC

## 2017-12-13 NOTE — Progress Notes (Signed)
12/13/2017 8:50 AM   Thomas [name] January 28, 1935 [id number]  Referring provider: [name], MD [address] [facility] [place], [place] [address]  Chief Complaint  Patient presents with  . Follow-up    HPI: 2017: Carcinoma of the prostate treated with radiation; small renal calculi with negative work-up for microscopic hematuria; easily dilated in the office with cystoscope bulbous urethral stricture; PSA October 19, 2016 0.2  Today The patient was called for routine follow-up.  History is more difficult with his deafness.  I do believe he likely has urge incontinence and significant nighttime frequency.  At first he said he was not having any bladder trouble.  He is on Flomax.  He is not complaining of blood in the urine or bladder infections  Modifying factors: There are no other modifying factors  Associated signs and symptoms: There are no other associated signs and symptoms Aggravating and relieving factors: There are no other aggravating or relieving factors Severity: Moderate Duration: Persistent    PMH: Past Medical History:  Diagnosis Date  . AICD (automatic cardioverter/defibrillator) present   . Biventricular ICD (implantable cardioverter-defibrillator) in place 03/24/2005   Implantation of a Medtronic Adapta ADDRO1, serial number [id number]  . CHF (congestive heart failure) ([place])   . CKD (chronic kidney disease), stage III ([place])   . Coronary artery disease    a. s/p CABG 1986. b. Multiple PCIs/caths. c. 09/2013: s/p PTCA and BMS to SVG-OM.  [name]   . Dysrhythmia   . History of abdominal aortic aneurysm   . History of bleeding peptic ulcer 1980  . History of epididymitis 2013  . HTN (hypertension)   . Hydronephrosis with ureteropelvic junction obstruction   . Hydroureter on left 2009  . Hypertension   . Ischemic cardiomyopathy    a. Prior EF 30-35%, s/p BIV-ICD. b. 09/2013: EF 45-50%.  . Moderate tricuspid regurgitation   . PAF (paroxysmal  atrial fibrillation) ([place])   . Presence of permanent cardiac pacemaker   . Prostate cancer ([place])   . Status post coronary artery bypass grafting 1986   LIMA to the LAD, SVG to OM, SVG to RCA  . Testicular swelling     Surgical History: Past Surgical History:  Procedure Laterality Date  . 2-D echocardiogram  11/20/2011   Ejection fraction 30-35% moderate concentric left ventricular hypertrophy. Left atrium is moderately dilated. Mild MR. Mild or  . BI-VENTRICULAR IMPLANTABLE CARDIOVERTER DEFIBRILLATOR N/A 12/16/2012   Procedure: BI-VENTRICULAR IMPLANTABLE CARDIOVERTER DEFIBRILLATOR  (CRT-D);  Surgeon: [name], MD;  Location: [facility] CATH LAB;  Service: Cardiovascular;  Laterality: N/A;  . CARDIAC CATHETERIZATION  12/10/2011   SVG to OM widely patent.  LIMA to LAD patent  . CATARACT EXTRACTION W/PHACO Right 10/12/2017   Procedure: CATARACT EXTRACTION PHACO AND INTRAOCULAR LENS PLACEMENT (IOC);  Surgeon: Birder Robson, MD;  Location: [facility] ORS;  Service: Ophthalmology;  Laterality: Right;  [place] 00:57 AP% 15.9 CDE 9.07 Fluid pack lot # [id number]  . CORONARY ARTERY BYPASS GRAFT  1986  . INSERT / REPLACE / REMOVE PACEMAKER    . LEFT HEART CATHETERIZATION WITH CORONARY/GRAFT ANGIOGRAM N/A 12/10/2011   Procedure: LEFT HEART CATHETERIZATION WITH Beatrix Fetters;  Surgeon: Sanda Klein, MD;  Location: [facility] CATH LAB;  Service: Cardiovascular;  Laterality: N/A;  . LEFT HEART CATHETERIZATION WITH CORONARY/GRAFT ANGIOGRAM N/A 09/25/2013   Procedure: LEFT HEART CATHETERIZATION WITH Beatrix Fetters;  Surgeon: Blane Ohara, MD;  Location: [facility] CATH LAB;  Service: Cardiovascular;  Laterality: N/A;  . Persantine  Myoview  05/06/2010   Post-rest ejection fraction 30%. No significant ischemia demonstrated. Compared to previous study there is no significant change.  . TRANSURETHRAL RESECTION OF PROSTATE     s/p    Home Medications:  Allergies as of 12/13/2017      Reactions    Phenazopyridine Nausea Only, Other (See Comments)   Other Reaction: GI UPSET   Ramipril Other (See Comments)      Medication List        Accurate as of 12/13/17  8:50 AM. Always use your most recent med list.          albuterol 108 (90 Base) MCG/ACT inhaler Commonly known as:  PROVENTIL HFA;VENTOLIN HFA 2 puffs q.i.d. p.r.n. short of breath, wheezing, or cough   apixaban 5 MG Tabs tablet Commonly known as:  ELIQUIS Take 1 tablet (5 mg total) by mouth 2 (two) times daily.   ELIQUIS 5 MG Tabs tablet Generic drug:  apixaban TAKE 1 TABLET (5 MG TOTAL) BY MOUTH 2 (TWO) TIMES DAILY.   carvedilol 12.5 MG tablet Commonly known as:  COREG Take 1 tablet (12.5 mg total) by mouth 2 (two) times daily with a meal.   furosemide 80 MG tablet Commonly known as:  LASIX Take 1 tablet (80 mg total) by mouth daily.   hydrochlorothiazide 12.5 MG tablet Commonly known as:  HYDRODIURIL   isosorbide mononitrate 60 MG 24 hr tablet Commonly known as:  IMDUR Take 1.5 tablets (90 mg total) by mouth daily. Please call to make appointment for further refills.   loratadine 10 MG tablet Commonly known as:  CLARITIN Take 1 tablet (10 mg total) by mouth daily.   losartan 50 MG tablet Commonly known as:  COZAAR TAKE 1 TABLET BY MOUTH EVERY DAY **TAKE WITH HCTZ**   losartan-hydrochlorothiazide 50-12.5 MG tablet Commonly known as:  HYZAAR TAKE 1 TABLET BY MOUTH EVERY DAY **NEED OFFICE VISIT**   mupirocin cream 2 % Commonly known as:  BACTROBAN Apply 1 application topically 2 (two) times daily.   potassium chloride 10 MEQ tablet Commonly known as:  K-DUR Take 3 tablets (30 mEq total) by mouth daily.   simvastatin 20 MG tablet Commonly known as:  ZOCOR TAKE 1 TABLET BY MOUTH DAILY   tamsulosin 0.4 MG Caps capsule Commonly known as:  FLOMAX Take 0.4 mg by mouth daily.   triamcinolone cream 0.1 % Commonly known as:  KENALOG Apply topically.   vitamin B-12 500 MCG tablet Commonly known  as:  CYANOCOBALAMIN Take 500 mcg by mouth daily.       Allergies:  Allergies  Allergen Reactions  . Phenazopyridine Nausea Only and Other (See Comments)    Other Reaction: GI UPSET  . Ramipril Other (See Comments)    Family History: Family History  Problem Relation Age of Onset  . Hypertension Father     Social History:  reports that he quit smoking about 32 years ago. He has never used smokeless tobacco. He reports that he does not drink alcohol or use drugs.  ROS: UROLOGY Frequent Urination?: Yes Hard to postpone urination?: No Burning/pain with urination?: No Get up at night to urinate?: Yes Leakage of urine?: Yes Urine stream starts and stops?: Yes Trouble starting stream?: No Do you have to strain to urinate?: No Blood in urine?: No Urinary tract infection?: No Sexually transmitted disease?: No Injury to kidneys or bladder?: No Painful intercourse?: No Weak stream?: No Erection problems?: No Penile pain?: No  Gastrointestinal Nausea?: No Vomiting?: No Indigestion/heartburn?: Yes  Diarrhea?: No Constipation?: No  Constitutional Fever: No Night sweats?: No Weight loss?: No Fatigue?: No  Skin Skin rash/lesions?: No Itching?: Yes  Eyes Blurred vision?: No Double vision?: No  Ears/Nose/Throat Sore throat?: No Sinus problems?: No  Hematologic/Lymphatic Swollen glands?: No Easy bruising?: No  Cardiovascular Leg swelling?: Yes Chest pain?: No  Respiratory Cough?: No Shortness of breath?: No  Endocrine Excessive thirst?: No  Musculoskeletal Back pain?: No Joint pain?: No  Neurological Headaches?: No Dizziness?: Yes  Psychologic Depression?: No Anxiety?: No  Physical Exam: BP (!) 105/58 (BP Location: Left Arm, Patient Position: Sitting, Cuff Size: Large)   Pulse 92   Ht 6\' 2"  (1.88 m)   Wt 239 lb 12.8 oz (108.8 kg)   BMI 30.79 kg/m   Constitutional:  Alert and oriented, No acute distress.  Laboratory Data: Lab Results    Component Value Date   WBC 5.2 07/15/2016   HGB 13.4 07/15/2016   HCT 39.1 (L) 07/15/2016   MCV 93.5 07/15/2016   PLT 133 (L) 07/15/2016    Lab Results  Component Value Date   CREATININE 1.27 08/24/2016    No results found for: PSA  No results found for: TESTOSTERONE  Lab Results  Component Value Date   HGBA1C 5.7 (H) 07/15/2016    Urinalysis    Component Value Date/Time   COLORURINE YELLOW (A) 07/15/2016 0557   APPEARANCEUR Clear 10/26/2016 1023   LABSPEC 1.019 07/15/2016 0557   LABSPEC 1.021 11/19/2011 1501   PHURINE 5.0 07/15/2016 0557   GLUCOSEU Negative 10/26/2016 1023   GLUCOSEU Negative 11/19/2011 1501   HGBUR SMALL (A) 07/15/2016 0557   BILIRUBINUR Negative 10/26/2016 1023   BILIRUBINUR Negative 11/19/2011 1501   KETONESUR NEGATIVE 07/15/2016 0557   PROTEINUR 1+ (A) 10/26/2016 1023   PROTEINUR NEGATIVE 07/15/2016 0557   NITRITE Negative 10/26/2016 1023   NITRITE NEGATIVE 07/15/2016 0557   LEUKOCYTESUR Negative 10/26/2016 1023   LEUKOCYTESUR Negative 11/19/2011 1501    Pertinent Imaging:   Assessment & Plan: Urine was sent for culture.  Residual was 32 mL.  Reassess in 6 or 7 weeks on Myrbetriq samples and prescription.  We will have reasonable treatment goals but I will try to help him with an overactive bladder medication in addition to re-prescribing the Flomax.  Based on age I did not order the PSA today  There are no diagnoses linked to this encounter.  No follow-ups on file.  Reece Packer, MD  [facility] [address], [place], [place] [address] [phone]

## 2017-12-25 ENCOUNTER — Other Ambulatory Visit: Payer: Self-pay | Admitting: Cardiovascular Disease

## 2017-12-27 NOTE — Telephone Encounter (Signed)
Rx has been sent to the pharmacy electronically. ° °

## 2017-12-30 ENCOUNTER — Emergency Department: Payer: Medicare Other

## 2017-12-30 ENCOUNTER — Encounter: Payer: Self-pay | Admitting: *Deleted

## 2017-12-30 ENCOUNTER — Other Ambulatory Visit: Payer: Self-pay

## 2017-12-30 ENCOUNTER — Emergency Department
Admission: EM | Admit: 2017-12-30 | Discharge: 2017-12-30 | Disposition: A | Discharge status: Home / Self Care | Payer: Medicare Other | Attending: Emergency Medicine | Admitting: Emergency Medicine

## 2017-12-30 DIAGNOSIS — Z7901 Long term (current) use of anticoagulants: Secondary | ICD-10-CM | POA: Diagnosis not present

## 2017-12-30 DIAGNOSIS — I7121 Aneurysm of the ascending aorta, without rupture: Secondary | ICD-10-CM

## 2017-12-30 DIAGNOSIS — I712 Thoracic aortic aneurysm, without rupture: Secondary | ICD-10-CM | POA: Diagnosis not present

## 2017-12-30 DIAGNOSIS — Z7722 Contact with and (suspected) exposure to environmental tobacco smoke (acute) (chronic): Secondary | ICD-10-CM | POA: Insufficient documentation

## 2017-12-30 DIAGNOSIS — Z79899 Other long term (current) drug therapy: Secondary | ICD-10-CM | POA: Diagnosis not present

## 2017-12-30 DIAGNOSIS — I5022 Chronic systolic (congestive) heart failure: Secondary | ICD-10-CM | POA: Insufficient documentation

## 2017-12-30 DIAGNOSIS — M542 Cervicalgia: Secondary | ICD-10-CM | POA: Diagnosis present

## 2017-12-30 DIAGNOSIS — Z8546 Personal history of malignant neoplasm of prostate: Secondary | ICD-10-CM | POA: Diagnosis not present

## 2017-12-30 DIAGNOSIS — I252 Old myocardial infarction: Secondary | ICD-10-CM | POA: Insufficient documentation

## 2017-12-30 DIAGNOSIS — I2510 Atherosclerotic heart disease of native coronary artery without angina pectoris: Secondary | ICD-10-CM | POA: Insufficient documentation

## 2017-12-30 DIAGNOSIS — Z950 Presence of cardiac pacemaker: Secondary | ICD-10-CM | POA: Diagnosis not present

## 2017-12-30 DIAGNOSIS — I130 Hypertensive heart and chronic kidney disease with heart failure and stage 1 through stage 4 chronic kidney disease, or unspecified chronic kidney disease: Secondary | ICD-10-CM | POA: Insufficient documentation

## 2017-12-30 DIAGNOSIS — N183 Chronic kidney disease, stage 3 (moderate): Secondary | ICD-10-CM | POA: Insufficient documentation

## 2017-12-30 DIAGNOSIS — Z951 Presence of aortocoronary bypass graft: Secondary | ICD-10-CM | POA: Insufficient documentation

## 2017-12-30 LAB — CBC
HCT: 34.7 % — ABNORMAL LOW (ref 39.0–52.0)
Hemoglobin: 11.2 g/dL — ABNORMAL LOW (ref 13.0–17.0)
MCH: 30.4 pg (ref 26.0–34.0)
MCHC: 32.3 g/dL (ref 30.0–36.0)
MCV: 94.3 fL (ref 80.0–100.0)
NRBC: 0 % (ref 0.0–0.2)
Platelets: 212 10*3/uL (ref 150–400)
RBC: 3.68 MIL/uL — AB (ref 4.22–5.81)
RDW: 15.6 % — ABNORMAL HIGH (ref 11.5–15.5)
WBC: 5.6 10*3/uL (ref 4.0–10.5)

## 2017-12-30 LAB — GROUP A STREP BY PCR: Group A Strep by PCR: NOT DETECTED

## 2017-12-30 LAB — BASIC METABOLIC PANEL
ANION GAP: 5 (ref 5–15)
BUN: 31 mg/dL — ABNORMAL HIGH (ref 8–23)
CALCIUM: 8.2 mg/dL — AB (ref 8.9–10.3)
CHLORIDE: 109 mmol/L (ref 98–111)
CO2: 28 mmol/L (ref 22–32)
Creatinine, Ser: 1.18 mg/dL (ref 0.61–1.24)
GFR calc non Af Amer: 56 mL/min — ABNORMAL LOW (ref >60)
Glucose, Bld: 105 mg/dL — ABNORMAL HIGH (ref 70–99)
Potassium: 4.1 mmol/L (ref 3.5–5.1)
SODIUM: 142 mmol/L (ref 135–145)

## 2017-12-30 MED ORDER — ACETAMINOPHEN 325 MG PO TABS
650.0000 mg | ORAL_TABLET | Freq: Once | ORAL | Status: AC
  Administered 2017-12-30: 650 mg via ORAL
  Filled 2017-12-30: qty 2

## 2017-12-30 MED ORDER — IOPAMIDOL (ISOVUE-370) INJECTION 76%
75.0000 mL | Freq: Once | INTRAVENOUS | Status: AC | PRN
  Administered 2017-12-30: 75 mL via INTRAVENOUS

## 2017-12-30 MED ORDER — LIDOCAINE VISCOUS HCL 2 % MT SOLN
15.0000 mL | Freq: Once | OROMUCOSAL | Status: AC
  Administered 2017-12-30: 15 mL via OROMUCOSAL
  Filled 2017-12-30: qty 15

## 2017-12-30 NOTE — ED Provider Notes (Signed)
[name]  ____________________________________________  Time seen: Approximately 8:47 PM  I have reviewed the triage vital signs and the nursing notes.   HISTORY  Chief Complaint Neck Pain   HPI Thomas [name] is a 83 y.o. male with a history of ischemic cardiomyopathy, CHF with EF of 30% status post AICD, CAD, deafness, hypertension, atrial fibrillation on Eliquis who presents for evaluation of neck pain.  Patient reports that he woke up with pain on the right side of his neck.  The pain is sharp and only present with swallowing.  There is no pain at rest.  He denies sore throat.  He reports that the pain radiates to his right ear.  The pain is not worse with movement of the neck.  No fever or chills, no cough, no nausea or vomiting.  Past Medical History:  Diagnosis Date  . AICD (automatic cardioverter/defibrillator) present   . Biventricular ICD (implantable cardioverter-defibrillator) in place 03/24/2005   Implantation of a Medtronic Adapta ADDRO1, serial number [id number]  . CHF (congestive heart failure) ([place])   . CKD (chronic kidney disease), stage III ([place])   . Coronary artery disease    a. s/p CABG 1986. b. Multiple PCIs/caths. c. 09/2013: s/p PTCA and BMS to SVG-OM.  [name]   . Dysrhythmia   . History of abdominal aortic aneurysm   . History of bleeding peptic ulcer 1980  . History of epididymitis 2013  . HTN (hypertension)   . Hydronephrosis with ureteropelvic junction obstruction   . Hydroureter on left 2009  . Hypertension   . Ischemic cardiomyopathy    a. Prior EF 30-35%, s/p BIV-ICD. b. 09/2013: EF 45-50%.  . Moderate tricuspid regurgitation   . PAF (paroxysmal atrial fibrillation) ([place])   . Presence of permanent cardiac pacemaker   . Prostate cancer ([place])   . Status post coronary artery bypass grafting 1986   LIMA to the LAD, SVG to OM, SVG to RCA  . Testicular swelling     Patient Active  Problem List   Diagnosis Date Noted  . Demand ischemia ([place]) 07/15/2016  . Weakness 07/15/2016  . Fatigue 07/15/2016  . Diarrhea 07/15/2016  . URI (upper respiratory infection) 07/15/2016  . CVA (cerebral infarction) 10/31/2015  . Bulbous urethral stricture 09/18/2015  . Bilateral deafness 08/19/2015  . Bilateral cataracts 08/19/2015  . Acid reflux 08/19/2015  . HLD (hyperlipidemia) 08/19/2015  . BP (high blood pressure) 08/19/2015  . Myocardial infarction ([place]) 08/19/2015  . Calculus of kidney 08/19/2015  . Artificial cardiac pacemaker 08/19/2015  . Gastroduodenal ulcer 08/19/2015  . Dupuytren's contracture of foot 08/19/2015  . Malignant neoplasm of prostate ([place]) 08/19/2015  . Microhematuria 08/19/2015  . Coronary artery disease 02/22/2015  . Status post coronary artery bypass grafting 02/22/2015  . Benign essential HTN 04/02/2014  . Hematochezia 02/20/2014  . Moderate tricuspid regurgitation 02/20/2014  . Mobitz type II atrioventricular block 12/12/2013  . Long term current use of anticoagulant 10/18/2013  . Acute myocardial infarction, subendocardial infarction, initial episode of care ([place]) 09/21/2013  . Cardiomyopathy, ischemic: Ejection fraction 45% 11/11/2012  . CAD Status post coronary artery bypass grafting: 1995 11/11/2012  . Permanent atrial fibrillation ([place]) 11/11/2012  . Biventricular ICD Medtronic Viva single chamber October 2014 11/11/2012  . Chronic systolic heart failure ([place]) 11/11/2012    Past Surgical History:  Procedure Laterality Date  . 2-D echocardiogram  11/20/2011   Ejection fraction 30-35% moderate concentric left ventricular hypertrophy. Left atrium is  moderately dilated. Mild MR. Mild or  . BI-VENTRICULAR IMPLANTABLE CARDIOVERTER DEFIBRILLATOR N/A 12/16/2012   Procedure: BI-VENTRICULAR IMPLANTABLE CARDIOVERTER DEFIBRILLATOR  (CRT-D);  Surgeon: [name], MD;  Location: [facility] CATH LAB;  Service: Cardiovascular;  Laterality: N/A;  . CARDIAC  CATHETERIZATION  12/10/2011   SVG to OM widely patent.  LIMA to LAD patent  . CATARACT EXTRACTION W/PHACO Right 10/12/2017   Procedure: CATARACT EXTRACTION PHACO AND INTRAOCULAR LENS PLACEMENT (IOC);  Surgeon: Birder Robson, MD;  Location: [facility] ORS;  Service: Ophthalmology;  Laterality: Right;  [place] 00:57 AP% 15.9 CDE 9.07 Fluid pack lot # [id number]  . CORONARY ARTERY BYPASS GRAFT  1986  . INSERT / REPLACE / REMOVE PACEMAKER    . LEFT HEART CATHETERIZATION WITH CORONARY/GRAFT ANGIOGRAM N/A 12/10/2011   Procedure: LEFT HEART CATHETERIZATION WITH Beatrix Fetters;  Surgeon: Sanda Klein, MD;  Location: [facility] [place] CATH LAB;  Service: Cardiovascular;  Laterality: N/A;  . LEFT HEART CATHETERIZATION WITH CORONARY/GRAFT ANGIOGRAM N/A 09/25/2013   Procedure: LEFT HEART CATHETERIZATION WITH Beatrix Fetters;  Surgeon: Blane Ohara, MD;  Location: [facility] CATH LAB;  Service: Cardiovascular;  Laterality: N/A;  . Persantine Myoview  05/06/2010   Post-rest ejection fraction 30%. No significant ischemia demonstrated. Compared to previous study there is no significant change.  . TRANSURETHRAL RESECTION OF PROSTATE     s/p    Prior to Admission medications   Medication Sig Start Date End Date Taking? Authorizing Provider  albuterol (PROVENTIL HFA;VENTOLIN HFA) 108 (90 Base) MCG/ACT inhaler 2 puffs q.i.d. p.r.n. short of breath, wheezing, or cough 10/30/16   [provider]  apixaban (ELIQUIS) 5 MG TABS tablet Take 1 tablet (5 mg total) by mouth 2 (two) times daily. 06/15/17   Croitoru, Mihai, MD  carvedilol (COREG) 12.5 MG tablet TAKE 1 TABLET BY MOUTH 2 TIMES DAILY WITH A MEAL. 12/27/17   Croitoru, Mihai, MD  ELIQUIS 5 MG TABS tablet TAKE 1 TABLET (5 MG TOTAL) BY MOUTH 2 (TWO) TIMES DAILY. 10/05/17   Croitoru, Mihai, MD  furosemide (LASIX) 80 MG tablet Take 1 tablet (80 mg total) by mouth daily. 03/08/17   Croitoru, Mihai, MD  hydrochlorothiazide (HYDRODIURIL) 12.5 MG tablet  12/04/17    [provider]  isosorbide mononitrate (IMDUR) 60 MG 24 hr tablet Take 1.5 tablets (90 mg total) by mouth daily. Please call to make appointment for further refills. 09/30/17   Croitoru, Mihai, MD  loratadine (CLARITIN) 10 MG tablet Take 1 tablet (10 mg total) by mouth daily. 11/30/16   Croitoru, Mihai, MD  losartan (COZAAR) 50 MG tablet TAKE 1 TABLET BY MOUTH EVERY DAY **TAKE WITH HCTZ** 11/29/17   Croitoru, [name] Gobble, MD  losartan-hydrochlorothiazide (HYZAAR) 50-12.5 MG tablet TAKE 1 TABLET BY MOUTH EVERY DAY **NEED OFFICE VISIT** 07/02/16   [name], MD  mirabegron ER (MYRBETRIQ) 50 MG TB24 tablet Take 1 tablet (50 mg total) by mouth daily. 12/13/17   Bjorn Loser, MD  mupirocin cream (BACTROBAN) 2 % Apply 1 application topically 2 (two) times daily. 08/29/17   Fisher, Linden Dolin, PA-C  potassium chloride (K-DUR) 10 MEQ tablet Take 3 tablets (30 mEq total) by mouth daily. 11/30/16   Croitoru, Mihai, MD  simvastatin (ZOCOR) 20 MG tablet TAKE 1 TABLET BY MOUTH DAILY 05/31/17   Croitoru, Mihai, MD  tamsulosin (FLOMAX) 0.4 MG CAPS capsule Take 1 capsule (0.4 mg total) by mouth daily. 12/13/17   Bjorn Loser, MD  triamcinolone cream (KENALOG) 0.1 % Apply topically. 01/12/17 04/30/18  [provider]  vitamin  B-12 (CYANOCOBALAMIN) 500 MCG tablet Take 500 mcg by mouth daily.    [provider]    Allergies Phenazopyridine and Ramipril  Family History  Problem Relation Age of Onset  . Hypertension Father     Social History Social History   Tobacco Use  . Smoking status: Former Smoker    Last attempt to quit: 03/15/1985    Years since quitting: 32.8  . Smokeless tobacco: Never Used  Substance Use Topics  . Alcohol use: No    Comment: occas.  . Drug use: No    Review of Systems  Constitutional: Negative for fever. Eyes: Negative for visual changes. ENT: Negative for sore throat. Neck: + neck pain  Cardiovascular: Negative for chest pain. Respiratory:  Negative for shortness of breath. Gastrointestinal: Negative for abdominal pain, vomiting or diarrhea. Genitourinary: Negative for dysuria. Musculoskeletal: Negative for back pain. Skin: Negative for rash. Neurological: Negative for headaches, weakness or numbness. Psych: No SI or HI  ____________________________________________   PHYSICAL EXAM:  VITAL SIGNS: ED Triage Vitals  Enc Vitals Group     BP 12/30/17 1757 123/70     Pulse Rate 12/30/17 1757 94     Resp 12/30/17 1757 14     Temp 12/30/17 1757 98 F (36.7 C)     Temp Source 12/30/17 1757 Oral     SpO2 12/30/17 1757 92 %     Weight 12/30/17 1804 239 lb (108.4 kg)     Height 12/30/17 1804 6\' 2"  (1.88 m)     Head Circumference --      Peak Flow --      Pain Score 12/30/17 1806 6     Pain Loc --      Pain Edu? --      Excl. in [place]? --     Constitutional: Alert and oriented. Well appearing and in no apparent distress. HEENT:      Head: Normocephalic and atraumatic.         Eyes: Conjunctivae are normal. Sclera is non-icteric.       Ears: TMs visualized and clear, external ear canal normal      Mouth/Throat: Mucous membranes are moist.  Oropharynx is clear with no erythema or exudate      Neck: Supple with no signs of meningismus.  Palpation of the right side of the neck elicits tenderness, no masses palpable, no lymphadenopathy Cardiovascular: Regular rate and rhythm. No murmurs, gallops, or rubs. 2+ symmetrical distal pulses are present in all extremities. No JVD. Respiratory: Normal respiratory effort. Lungs are clear to auscultation bilaterally. No wheezes, crackles, or rhonchi.  Gastrointestinal: Soft, non tender, and non distended with positive bowel sounds. No rebound or guarding. Musculoskeletal: Nontender with normal range of motion in all extremities. No edema, cyanosis, or erythema of extremities. Neurologic: Normal speech and language. Face is symmetric. Moving all extremities. No gross focal neurologic  deficits are appreciated. Skin: Skin is warm, dry and intact. No rash noted. Psychiatric: Mood and affect are normal. Speech and behavior are normal.  ____________________________________________   LABS (all labs ordered are listed, but only abnormal results are displayed)  Labs Reviewed  BASIC METABOLIC PANEL - Abnormal; Notable for the following components:      Result Value   Glucose, Bld 105 (*)    BUN 31 (*)    Calcium 8.2 (*)    GFR calc non Af Amer 56 (*)    All other components within normal limits  CBC - Abnormal; Notable for the following  components:   RBC 3.68 (*)    Hemoglobin 11.2 (*)    HCT 34.7 (*)    RDW 15.6 (*)    All other components within normal limits  GROUP A STREP BY PCR   ____________________________________________  EKG  none  ____________________________________________  RADIOLOGY  I have personally reviewed the images performed during this visit and I agree with the Radiologist's read.   Interpretation by Radiologist:  Ct Angio Neck W And/or Wo Contrast  Result Date: 12/30/2017 CLINICAL DATA:  83 y/o M; pain when swallowing on the right-sided neck. Symptoms began this morning. EXAM: CT ANGIOGRAPHY NECK TECHNIQUE: Multidetector CT imaging of the neck was performed using the standard protocol during bolus administration of intravenous contrast. Multiplanar CT image reconstructions and MIPs were obtained to evaluate the vascular anatomy. Carotid stenosis measurements (when applicable) are obtained utilizing NASCET criteria, using the distal internal carotid diameter as the denominator. CONTRAST:  75 cc Isovue 370 COMPARISON:  10/31/2015 carotid ultrasound FINDINGS: Aortic arch: Ascending thoracic aortic aneurysm measuring 4.8 cm, partially visualized. Postsurgical changes related to coronary artery bypass with LIMA and saphenous grafts. Aortic calcific atherosclerosis. No dissection. Right carotid system: No evidence of dissection, stenosis (50% or  greater) or occlusion. Non stenotic calcified plaque of the carotid bifurcation. Left carotid system: No evidence of dissection, stenosis (50% or greater) or occlusion. Calcified plaque of the carotid bifurcation with mild less than 50% proximal ICA stenosis. Vertebral arteries: Codominant. No evidence of dissection, stenosis (50% or greater) or occlusion. Calcified plaque of vertebral artery origins with mild less than 50% stenosis. Skeleton: Moderate cervical spondylosis with multilevel disc and facet degenerative changes. No high-grade bony foraminal or canal stenosis. No acute osseous abnormality is evident. Other neck: Negative. Upper chest: Small bilateral pleural effusions. IMPRESSION: 1. No evidence of dissection, hemodynamically significant stenosis, or occlusion of the carotid and vertebral arteries of the neck. 2. No mass or inflammatory process identified. 3. Ascending thoracic aortic aneurysm measuring 4.8 cm, partially visualized. Recommend semi-annual imaging followup by CTA or MRA and referral to cardiothoracic surgery if not already obtained. This recommendation follows 2010 ACCF/AHA/AATS/ACR/ASA/SCA/SCAI/SIR/STS/SVM Guidelines for the Diagnosis and Management of Patients With Thoracic Aortic Disease. Circulation. 2010; 121: S505-L976 4. Small bilateral pleural effusions. Electronically Signed   By: Kristine Garbe M.D.   On: 12/30/2017 22:35      ____________________________________________   PROCEDURES  Procedure(s) performed: None Procedures Critical Care performed:  None ____________________________________________   INITIAL IMPRESSION / ASSESSMENT AND PLAN / ED COURSE   83 y.o. male with a history of ischemic cardiomyopathy, CHF with EF of 30% status post AICD, CAD, deafness, hypertension, atrial fibrillation on Eliquis who presents for evaluation of neck pain.  Normal head and neck exam with tenderness to palpation of the right lateral neck with no obvious masses or  lymphadenopathy.  Due to prior history of smoking we will send patient for CT of the neck to rule out deep mass versus cervical artery dissection/thrombosis.  Strep swab done in the waiting room which is negative.  There is no evidence of erythema of the tonsils or peritonsillar abscess. Will give tylenol and viscous lidocaine for pain  Clinical Course as of Dec 30 2308  Thu Dec 30, 2017  2253 CT showing no abnormalities of the neck.  It did show an incidental finding of an ascending aortic thoracic aneurysm. Patient has no CP and neck pain is only presents with swallowing therefore unlikely this is the cause of his pain. Recommended f/u with  vascular surgery. Neck pain most likely viral pharyngitis. Recommended tylenol and f/u with PCP. Discussed my standard return precautions including chest pain, sob, fever, difficulty swallowing.    [CV]    Clinical Course User Index [CV] Alfred Levins [place], MD     As part of my medical decision making, I reviewed the following data within the [place] notes reviewed and incorporated, Interpreter needed, Labs reviewed , Old chart reviewed, Radiograph reviewed , Notes from prior ED visits and [place] Controlled Substance Database    Pertinent labs & imaging results that were available during my care of the patient were reviewed by me and considered in my medical decision making (see chart for details).    ____________________________________________   FINAL CLINICAL IMPRESSION(S) / ED DIAGNOSES  Final diagnoses:  Neck pain  Thoracic ascending aortic aneurysm ([place])      NEW MEDICATIONS STARTED DURING THIS VISIT:  ED Discharge Orders    None       Note:  This document was prepared using Dragon voice recognition software and may include unintentional dictation errors.    Rudene Re, MD 12/30/17 [phone]

## 2017-12-30 NOTE — ED Notes (Signed)
Dr Alfred Levins and Daiva Nakayama, RN at bedside at this time to assess pt using video ASL interpreter.

## 2017-12-30 NOTE — ED Triage Notes (Addendum)
Pt is deaf.  Interpreter on stick in room with pt.  Pt reports pain when swallowing and pain on right side of neck.  Pt staes throbbing type of pain.  No known injury to neck.   Pt denies sore throat.  No earache.  Sx began this morning.   Pt alert

## 2017-12-30 NOTE — ED Notes (Signed)
Per dr Kerman Passey, strep test only for now.

## 2017-12-30 NOTE — ED Notes (Signed)
Pt returned to ED Rm 25 from CT at this time. 

## 2017-12-30 NOTE — Discharge Instructions (Addendum)
CT did not show any cause of your neck pain.  This is most likely a sore throat caused by a virus.  The CT did find a dilation of the big aorta in your chest.  It is very important that you follow-up with vascular surgeon Dr. Lucky Cowboy in a couple of weeks for close monitoring.  Return to the emergency room if you have chest pain or shortness of breath, difficulty swallowing, fever, or any other new symptoms that are concerning to you.  In the meantime just take Tylenol for the pain.

## 2018-01-18 ENCOUNTER — Ambulatory Visit (INDEPENDENT_AMBULATORY_CARE_PROVIDER_SITE_OTHER): Payer: Medicare Other | Admitting: *Deleted

## 2018-01-18 DIAGNOSIS — I255 Ischemic cardiomyopathy: Secondary | ICD-10-CM | POA: Diagnosis not present

## 2018-01-19 NOTE — Progress Notes (Signed)
Remote ICD transmission.   

## 2018-01-21 ENCOUNTER — Other Ambulatory Visit: Payer: Self-pay | Admitting: Cardiovascular Disease

## 2018-01-21 NOTE — Telephone Encounter (Signed)
Rx request sent to pharmacy.  

## 2018-01-23 ENCOUNTER — Encounter: Payer: Self-pay | Admitting: Cardiology

## 2018-02-01 ENCOUNTER — Other Ambulatory Visit: Payer: Self-pay

## 2018-02-01 ENCOUNTER — Emergency Department: Payer: Medicare Other

## 2018-02-01 ENCOUNTER — Encounter: Payer: Self-pay | Admitting: Emergency Medicine

## 2018-02-01 ENCOUNTER — Emergency Department
Admission: EM | Admit: 2018-02-01 | Discharge: 2018-02-01 | Disposition: A | Discharge status: Home / Self Care | Payer: Medicare Other | Attending: Emergency Medicine | Admitting: Emergency Medicine

## 2018-02-01 DIAGNOSIS — I252 Old myocardial infarction: Secondary | ICD-10-CM | POA: Insufficient documentation

## 2018-02-01 DIAGNOSIS — Z950 Presence of cardiac pacemaker: Secondary | ICD-10-CM | POA: Diagnosis not present

## 2018-02-01 DIAGNOSIS — I130 Hypertensive heart and chronic kidney disease with heart failure and stage 1 through stage 4 chronic kidney disease, or unspecified chronic kidney disease: Secondary | ICD-10-CM | POA: Insufficient documentation

## 2018-02-01 DIAGNOSIS — Z8546 Personal history of malignant neoplasm of prostate: Secondary | ICD-10-CM | POA: Insufficient documentation

## 2018-02-01 DIAGNOSIS — Z87891 Personal history of nicotine dependence: Secondary | ICD-10-CM | POA: Diagnosis not present

## 2018-02-01 DIAGNOSIS — Z79899 Other long term (current) drug therapy: Secondary | ICD-10-CM | POA: Diagnosis not present

## 2018-02-01 DIAGNOSIS — Z951 Presence of aortocoronary bypass graft: Secondary | ICD-10-CM | POA: Diagnosis not present

## 2018-02-01 DIAGNOSIS — Z7901 Long term (current) use of anticoagulants: Secondary | ICD-10-CM | POA: Diagnosis not present

## 2018-02-01 DIAGNOSIS — I509 Heart failure, unspecified: Secondary | ICD-10-CM | POA: Diagnosis not present

## 2018-02-01 DIAGNOSIS — I2510 Atherosclerotic heart disease of native coronary artery without angina pectoris: Secondary | ICD-10-CM | POA: Diagnosis not present

## 2018-02-01 DIAGNOSIS — R0602 Shortness of breath: Secondary | ICD-10-CM | POA: Diagnosis present

## 2018-02-01 DIAGNOSIS — N183 Chronic kidney disease, stage 3 (moderate): Secondary | ICD-10-CM | POA: Insufficient documentation

## 2018-02-01 LAB — COMPREHENSIVE METABOLIC PANEL
ALT: 15 U/L (ref 0–44)
AST: 25 U/L (ref 15–41)
Albumin: 2.8 g/dL — ABNORMAL LOW (ref 3.5–5.0)
Alkaline Phosphatase: 87 U/L (ref 38–126)
Anion gap: 3 — ABNORMAL LOW (ref 5–15)
BILIRUBIN TOTAL: 1 mg/dL (ref 0.3–1.2)
BUN: 31 mg/dL — ABNORMAL HIGH (ref 8–23)
CHLORIDE: 112 mmol/L — AB (ref 98–111)
CO2: 26 mmol/L (ref 22–32)
CREATININE: 1.08 mg/dL (ref 0.61–1.24)
Calcium: 8.6 mg/dL — ABNORMAL LOW (ref 8.9–10.3)
GFR calc non Af Amer: >60 mL/min (ref >60)
Glucose, Bld: 118 mg/dL — ABNORMAL HIGH (ref 70–99)
POTASSIUM: 4.6 mmol/L (ref 3.5–5.1)
Sodium: 141 mmol/L (ref 135–145)
TOTAL PROTEIN: 5.3 g/dL — AB (ref 6.5–8.1)

## 2018-02-01 LAB — LIPASE, BLOOD: LIPASE: 30 U/L (ref 11–51)

## 2018-02-01 LAB — CBC WITH DIFFERENTIAL/PLATELET
Abs Immature Granulocytes: 0.03 10*3/uL (ref 0.00–0.07)
BASOS PCT: 1 %
Basophils Absolute: 0.1 10*3/uL (ref 0.0–0.1)
EOS ABS: 0.2 10*3/uL (ref 0.0–0.5)
EOS PCT: 4 %
HCT: 34.6 % — ABNORMAL LOW (ref 39.0–52.0)
Hemoglobin: 10.7 g/dL — ABNORMAL LOW (ref 13.0–17.0)
Immature Granulocytes: 1 %
Lymphocytes Relative: 13 %
Lymphs Abs: 0.7 10*3/uL (ref 0.7–4.0)
MCH: 29.2 pg (ref 26.0–34.0)
MCHC: 30.9 g/dL (ref 30.0–36.0)
MCV: 94.5 fL (ref 80.0–100.0)
MONO ABS: 0.7 10*3/uL (ref 0.1–1.0)
Monocytes Relative: 13 %
Neutro Abs: 3.9 10*3/uL (ref 1.7–7.7)
Neutrophils Relative %: 68 %
PLATELETS: 201 10*3/uL (ref 150–400)
RBC: 3.66 MIL/uL — AB (ref 4.22–5.81)
RDW: 15.6 % — ABNORMAL HIGH (ref 11.5–15.5)
WBC: 5.6 10*3/uL (ref 4.0–10.5)
nRBC: 0 % (ref 0.0–0.2)

## 2018-02-01 LAB — BRAIN NATRIURETIC PEPTIDE: B NATRIURETIC PEPTIDE 5: 400 pg/mL — AB (ref 0.0–100.0)

## 2018-02-01 LAB — MAGNESIUM: MAGNESIUM: 2.1 mg/dL (ref 1.7–2.4)

## 2018-02-01 LAB — TROPONIN I: TROPONIN I: 0.03 ng/mL — AB (ref <0.03)

## 2018-02-01 MED ORDER — FUROSEMIDE 10 MG/ML IJ SOLN
80.0000 mg | Freq: Once | INTRAMUSCULAR | Status: AC
  Administered 2018-02-01: 80 mg via INTRAVENOUS
  Filled 2018-02-01: qty 8

## 2018-02-01 NOTE — Discharge Instructions (Addendum)
As we discussed, you do have a little bit too much fluid at this time consistent with an acute CHF exacerbation.  Fortunately it does not require you to stay in the hospital at this time.  We gave you an extra dose of Lasix.  I also encourage you to take 2 doses of Lasix tomorrow instead of just 1.   please take your medications as recommended and follow-up with the doctor listed at the next available opportunity.  Return to the emergency department if you develop any new or worsening symptoms that concern you.

## 2018-02-01 NOTE — ED Triage Notes (Signed)
Patient ambulatory to triage with steady gait, without difficulty or distress noted; pt accomp by wife, both deaf, relaying info thru written communication; pt reports SHOB, nonprod cough and mid CP tonight; denies any recent illness

## 2018-02-01 NOTE — ED Provider Notes (Signed)
[facility] Emergency Department Provider Note  ____________________________________________   First MD Initiated Contact with Patient 02/01/18 0522     (approximate)  I have reviewed the triage vital signs and the nursing notes.   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  Communication is accomplished through the use of the iPad ASL interpreter.  HPI Thomas [name] is a 83 y.o. male with medical history as listed below which most notably includes CHF with an ejection fraction of about 30% on Lasix 80 mg daily.  He presents by private vehicle with his wife for evaluation of acute onset shortness of breath tonight.  He states he was in his usual state of health yesterday but while he was asleep he developed shortness of breath and awoke gasping.  He states that the symptoms were worse when he was lying flat and better when he sat up.  Using his albuterol inhaler at home did not help.  He also reports some dull substernal chest pain associated with shortness of breath.  He has a mild nonproductive cough.  He reports that for a week or more he has had increased swelling to bilateral lower extremities.  He states he has been compliant with his Lasix and cannot think of a particular reason his symptoms may be getting worse.  He denies fever/chills, nausea, vomiting, abdominal pain, and dysuria.  He has had similar symptoms in the past.  He describes the symptoms as severe earlier and currently moderate.  Even at rest he can feel some shortness of breath.  Past Medical History:  Diagnosis Date  . AICD (automatic cardioverter/defibrillator) present   . Biventricular ICD (implantable cardioverter-defibrillator) in place 03/24/2005   Implantation of a Medtronic Adapta ADDRO1, serial number [id number]  . CHF (congestive heart failure) ([place])   . CKD (chronic kidney disease), stage III ([place])   . Coronary artery disease    a.  s/p CABG 1986. b. Multiple PCIs/caths. c. 09/2013: s/p PTCA and BMS to SVG-OM.  [name]   . Dysrhythmia   . History of abdominal aortic aneurysm   . History of bleeding peptic ulcer 1980  . History of epididymitis 2013  . HTN (hypertension)   . Hydronephrosis with ureteropelvic junction obstruction   . Hydroureter on left 2009  . Hypertension   . Ischemic cardiomyopathy    a. Prior EF 30-35%, s/p BIV-ICD. b. 09/2013: EF 45-50%.  . Moderate tricuspid regurgitation   . PAF (paroxysmal atrial fibrillation) ([place])   . Presence of permanent cardiac pacemaker   . Prostate cancer ([place])   . Status post coronary artery bypass grafting 1986   LIMA to the LAD, SVG to OM, SVG to RCA  . Testicular swelling     Patient Active Problem List   Diagnosis Date Noted  . Demand ischemia ([place]) 07/15/2016  . Weakness 07/15/2016  . Fatigue 07/15/2016  . Diarrhea 07/15/2016  . URI (upper respiratory infection) 07/15/2016  . CVA (cerebral infarction) 10/31/2015  . Bulbous urethral stricture 09/18/2015  . Bilateral deafness 08/19/2015  . Bilateral cataracts 08/19/2015  . Acid reflux 08/19/2015  . HLD (hyperlipidemia) 08/19/2015  . BP (high blood pressure) 08/19/2015  . Myocardial infarction ([place]) 08/19/2015  . Calculus of kidney 08/19/2015  . Artificial cardiac pacemaker 08/19/2015  . Gastroduodenal ulcer 08/19/2015  . Dupuytren's contracture of foot 08/19/2015  . Malignant neoplasm of prostate ([place]) 08/19/2015  . Microhematuria 08/19/2015  . Coronary artery disease  02/22/2015  . Status post coronary artery bypass grafting 02/22/2015  . Benign essential HTN 04/02/2014  . Hematochezia 02/20/2014  . Moderate tricuspid regurgitation 02/20/2014  . Mobitz type II atrioventricular block 12/12/2013  . Long term current use of anticoagulant 10/18/2013  . Acute myocardial infarction, subendocardial infarction, initial episode of care ([place]) 09/21/2013  . Cardiomyopathy, ischemic: Ejection fraction 45%  11/11/2012  . CAD Status post coronary artery bypass grafting: 1995 11/11/2012  . Permanent atrial fibrillation ([place]) 11/11/2012  . Biventricular ICD Medtronic Viva single chamber October 2014 11/11/2012  . Chronic systolic heart failure ([place]) 11/11/2012    Past Surgical History:  Procedure Laterality Date  . 2-D echocardiogram  11/20/2011   Ejection fraction 30-35% moderate concentric left ventricular hypertrophy. Left atrium is moderately dilated. Mild MR. Mild or  . BI-VENTRICULAR IMPLANTABLE CARDIOVERTER DEFIBRILLATOR N/A 12/16/2012   Procedure: BI-VENTRICULAR IMPLANTABLE CARDIOVERTER DEFIBRILLATOR  (CRT-D);  Surgeon: [name], MD;  Location: [facility] CATH LAB;  Service: Cardiovascular;  Laterality: N/A;  . CARDIAC CATHETERIZATION  12/10/2011   SVG to OM widely patent.  LIMA to LAD patent  . CATARACT EXTRACTION W/PHACO Right 10/12/2017   Procedure: CATARACT EXTRACTION PHACO AND INTRAOCULAR LENS PLACEMENT (IOC);  Surgeon: Birder Robson, MD;  Location: [facility] ORS;  Service: Ophthalmology;  Laterality: Right;  [place] 00:57 AP% 15.9 CDE 9.07 Fluid pack lot # [id number]  . CORONARY ARTERY BYPASS GRAFT  1986  . INSERT / REPLACE / REMOVE PACEMAKER    . LEFT HEART CATHETERIZATION WITH CORONARY/GRAFT ANGIOGRAM N/A 12/10/2011   Procedure: LEFT HEART CATHETERIZATION WITH Beatrix Fetters;  Surgeon: Sanda Klein, MD;  Location: [facility] CATH LAB;  Service: Cardiovascular;  Laterality: N/A;  . LEFT HEART CATHETERIZATION WITH CORONARY/GRAFT ANGIOGRAM N/A 09/25/2013   Procedure: LEFT HEART CATHETERIZATION WITH Beatrix Fetters;  Surgeon: Blane Ohara, MD;  Location: [facility] CATH LAB;  Service: Cardiovascular;  Laterality: N/A;  . Persantine Myoview  05/06/2010   Post-rest ejection fraction 30%. No significant ischemia demonstrated. Compared to previous study there is no significant change.  . TRANSURETHRAL RESECTION OF PROSTATE     s/p    Prior to Admission medications   Medication Sig Start  Date End Date Taking? Authorizing Provider  albuterol (PROVENTIL HFA;VENTOLIN HFA) 108 (90 Base) MCG/ACT inhaler 2 puffs q.i.d. p.r.n. short of breath, wheezing, or cough 10/30/16   [provider]  apixaban (ELIQUIS) 5 MG TABS tablet Take 1 tablet (5 mg total) by mouth 2 (two) times daily. 06/15/17   Croitoru, Mihai, MD  carvedilol (COREG) 12.5 MG tablet TAKE 1 TABLET BY MOUTH 2 TIMES DAILY WITH A MEAL. 12/27/17   Croitoru, Mihai, MD  ELIQUIS 5 MG TABS tablet TAKE 1 TABLET (5 MG TOTAL) BY MOUTH 2 (TWO) TIMES DAILY. 10/05/17   Croitoru, Mihai, MD  furosemide (LASIX) 80 MG tablet Take 1 tablet (80 mg total) by mouth daily. 03/08/17   Croitoru, Mihai, MD  hydrochlorothiazide (HYDRODIURIL) 12.5 MG tablet  12/04/17   [provider]  isosorbide mononitrate (IMDUR) 60 MG 24 hr tablet TAKE 1.5 TABLETS BY MOUTH DAILY. **PLEASE CALL TO MAKE APPOINTMENT FOR FURTHER REFILLS 01/21/18   Croitoru, [name] Gobble, MD  loratadine (CLARITIN) 10 MG tablet Take 1 tablet (10 mg total) by mouth daily. 11/30/16   Croitoru, Mihai, MD  losartan (COZAAR) 50 MG tablet TAKE 1 TABLET BY MOUTH EVERY DAY **TAKE WITH HCTZ** 11/29/17   Croitoru, Mihai, MD  losartan-hydrochlorothiazide (HYZAAR) 50-12.5 MG tablet TAKE 1 TABLET BY MOUTH EVERY DAY **NEED OFFICE VISIT** 07/02/16  [name], MD  mirabegron ER (MYRBETRIQ) 50 MG TB24 tablet Take 1 tablet (50 mg total) by mouth daily. 12/13/17   Bjorn Loser, MD  mupirocin cream (BACTROBAN) 2 % Apply 1 application topically 2 (two) times daily. 08/29/17   Fisher, Linden Dolin, PA-C  potassium chloride (K-DUR) 10 MEQ tablet Take 3 tablets (30 mEq total) by mouth daily. 11/30/16   Croitoru, Mihai, MD  simvastatin (ZOCOR) 20 MG tablet TAKE 1 TABLET BY MOUTH DAILY 05/31/17   Croitoru, Mihai, MD  tamsulosin (FLOMAX) 0.4 MG CAPS capsule Take 1 capsule (0.4 mg total) by mouth daily. 12/13/17   Bjorn Loser, MD  triamcinolone cream (KENALOG) 0.1 % Apply topically. 01/12/17 04/30/18   [provider]  vitamin B-12 (CYANOCOBALAMIN) 500 MCG tablet Take 500 mcg by mouth daily.    [provider]    Allergies Phenazopyridine and Ramipril  Family History  Problem Relation Age of Onset  . Hypertension Father     Social History Social History   Tobacco Use  . Smoking status: Former Smoker    Last attempt to quit: 03/15/1985    Years since quitting: 32.9  . Smokeless tobacco: Never Used  Substance Use Topics  . Alcohol use: No    Comment: occas.  . Drug use: No    Review of Systems Constitutional: No fever/chills Eyes: No visual changes. ENT: No sore throat. Cardiovascular: Substernal chest pressure or discomfort Respiratory: Shortness of breath, worse with supine position, better with sitting up.  Mild cough. Gastrointestinal: No abdominal pain.  No nausea, no vomiting.  No diarrhea.  No constipation. Genitourinary: Negative for dysuria. Musculoskeletal: Bilateral lower extremity swelling for a week or more.  Negative for neck pain.  Negative for back pain. Integumentary: Negative for rash. Neurological: Negative for headaches, focal weakness or numbness.   ____________________________________________   PHYSICAL EXAM:  VITAL SIGNS: ED Triage Vitals  Enc Vitals Group     BP 02/01/18 0449 (!) 148/78     Pulse Rate 02/01/18 0449 82     Resp 02/01/18 0449 18     Temp 02/01/18 0449 98.3 F (36.8 C)     Temp Source 02/01/18 0449 Oral     SpO2 02/01/18 0449 100 %     Weight 02/01/18 0454 111.1 kg (245 lb)     Height 02/01/18 0454 1.88 m (6\' 2" )     Head Circumference --      Peak Flow --      Pain Score 02/01/18 0454 6     Pain Loc --      Pain Edu? --      Excl. in [place]? --     Constitutional: Alert and oriented. Well appearing and in no acute distress while at rest in bed Eyes: Conjunctivae are normal.  Head: Atraumatic. Nose: No congestion/rhinnorhea. Mouth/Throat: Mucous membranes are moist. Neck: No stridor.  No meningeal  signs.   Cardiovascular: Normal rate, regular rhythm. Good peripheral circulation. Grossly normal heart sounds.  Pacemaker. Respiratory: Normal respiratory effort.  No retractions. Lungs CTAB. Gastrointestinal: Soft and nontender. No distention.  Musculoskeletal: 1-2+ pitting edema in bilateral lower extremities.  No evidence of acute infection. Neurologic:  Normal speech and language. No gross focal neurologic deficits are appreciated.  Skin:  Skin is warm, dry and intact. No rash noted. Psychiatric: Mood and affect are normal. Speech and behavior are normal.  ____________________________________________   LABS (all labs ordered are listed, but only abnormal results are displayed)  Labs Reviewed  CBC  WITH DIFFERENTIAL/PLATELET - Abnormal; Notable for the following components:      Result Value   RBC 3.66 (*)    Hemoglobin 10.7 (*)    HCT 34.6 (*)    RDW 15.6 (*)    All other components within normal limits  TROPONIN I - Abnormal; Notable for the following components:   Troponin I 0.03 (*)    All other components within normal limits  COMPREHENSIVE METABOLIC PANEL - Abnormal; Notable for the following components:   Chloride 112 (*)    Glucose, Bld 118 (*)    BUN 31 (*)    Calcium 8.6 (*)    Total Protein 5.3 (*)    Albumin 2.8 (*)    Anion gap 3 (*)    All other components within normal limits  BRAIN NATRIURETIC PEPTIDE - Abnormal; Notable for the following components:   B Natriuretic Peptide 400.0 (*)    All other components within normal limits  LIPASE, BLOOD  MAGNESIUM   ____________________________________________  EKG  ED ECG REPORT I, Hinda Kehr, the attending physician, personally viewed and interpreted this ECG.  Date: 02/01/2018 EKG Time: 4:57 AM Rate: 87 Rhythm: Ventricular paced rhythm QRS Axis: Ventricular paced rhythm Intervals: Ventricular paced rhythm ST/T Wave abnormalities: Non-specific ST segment / T-wave changes, but no evidence of acute  ischemia. Narrative Interpretation: no evidence of acute ischemia   ____________________________________________  RADIOLOGY   ED MD interpretation: Pulmonary vascular congestion with some small bilateral pleural effusions and cardiac enlargement.  Official radiology report(s): Dg Chest 2 View  Result Date: 02/01/2018 CLINICAL DATA:  Shortness of breath and cough and chest pain tonight. EXAM: CHEST - 2 VIEW COMPARISON:  07/15/2016 FINDINGS: Postoperative changes in the mediastinum. Cardiac pacemaker. Cardiac enlargement with mild pulmonary vascular congestion. No edema or consolidation. Blunting of costophrenic angles consistent with small bilateral pleural effusions. No pneumothorax. Calcification of the aorta. Degenerative changes in the spine. IMPRESSION: Cardiac enlargement with mild pulmonary vascular congestion and small bilateral pleural effusions. Electronically Signed   By: Lucienne Capers M.D.   On: 02/01/2018 05:27    ____________________________________________   PROCEDURES  Critical Care performed: No   Procedure(s) performed:   Procedures   ____________________________________________   INITIAL IMPRESSION / ASSESSMENT AND PLAN / ED COURSE  As part of my medical decision making, I reviewed the following data within the [place] History obtained from family, Nursing notes reviewed and incorporated, Interpreter needed, Labs reviewed , EKG interpreted , Old chart reviewed, Radiograph reviewed  and Notes from prior ED visits    Differential diagnosis includes, but is not limited to, acute CHF exacerbation, pneumonia, ACS, pericardial effusion, other nonspecific infection.  The patient's history and signs and symptoms are all consistent with CHF exacerbation.  He does not see his cardiologist regularly and apparently has not been to see him or her for a year.  His vital signs are stable and he is satting in the upper 90s.  His lung sounds are clear.   His troponin is negligibly elevated at 0.03 which is not unexpected given his symptoms and his history of heart failure.  He has no leukocytosis.  His renal function is normal and has no significant abnormalities on conference of metabolic panel, lipase, nor magnesium.  His BNP is minimally elevated at 400.  I believe this patient would be best served by an additional dose of Lasix 80 mg IV and close outpatient follow-up in the heart failure clinic.  He is agreeable to this  plan.  I will send a message to the heart failure clinic and order an outpatient ambulatory consult so that he can get into see them as quickly as possible.  I gave my usual and customary return precautions and he and his wife understand and agree with the plan.  I have recommending that the patient take twice daily Lasix 80 mg at home at least for the next 2 days or until he can follow-up with either his cardiologist or the heart failure clinic.     ____________________________________________  FINAL CLINICAL IMPRESSION(S) / ED DIAGNOSES  Final diagnoses:  Acute on chronic congestive heart failure, unspecified heart failure type ([place])     MEDICATIONS GIVEN DURING THIS VISIT:  Medications  furosemide (LASIX) injection 80 mg (80 mg Intravenous Given 02/01/18 0962)     ED Discharge Orders         Ordered    AMB referral to CHF clinic    Comments:  Patient has acute CHF exacerbation, only sees cardiologist once a year and the cardiologist is not in [place].  Would benefit from close outpatient follow-up with you. Of note, patient and wife are both deaf and will require ASL interpreting services.   02/01/18 0716           Note:  This document was prepared using Dragon voice recognition software and may include unintentional dictation errors.    Hinda Kehr, MD 02/01/18 [phone]

## 2018-02-01 NOTE — ED Notes (Signed)
Patient and patients wife are deaf, ASL Interpreter over computer used to communicate.

## 2018-02-01 NOTE — ED Notes (Signed)
[employer] interpreter Daleen Snook used for discharge. Discharge instructions and follow-up care reviewed. Patient and spouse verbalized understanding

## 2018-02-07 ENCOUNTER — Ambulatory Visit: Payer: Medicare Other | Admitting: Family

## 2018-02-07 ENCOUNTER — Encounter: Payer: Self-pay | Admitting: Urology

## 2018-02-07 ENCOUNTER — Ambulatory Visit: Payer: Medicare Other | Admitting: Urology

## 2018-02-09 NOTE — Progress Notes (Deleted)
   Patient ID: Thomas [name], male    DOB: October 02, 1934, 83 y.o.   MRN: [id number]  HPI  Thomas [name] is a 83 y/o male with a history of   Echo report from 11/25/15 reviewed and showed an EF of 45-50% along with mild AR, moderate Thomas and mild-moderate TR.   Was in the ED 02/01/18 due to acute on chronic HF. Was given IV lasix and released. Was in the ED 12/30/17 due to neck pain. CT showed no neck abnormalities and patient was released.   He presents today for his initial visit with a chief complaint of   Review of Systems    Physical Exam    Assessment & Plan:  1: Chronic heart failure with mildly reduced ejection fraction- - NYHA class - BNP 02/01/18 was 400.0  2: HTN- - BP - BMP from 02/01/18 reviewed and showed sodium 141, potassium 4.6, creatinine 1.08 and GFR >60

## 2018-02-14 ENCOUNTER — Ambulatory Visit: Payer: Medicare Other | Admitting: Family

## 2018-02-24 ENCOUNTER — Ambulatory Visit: Payer: Medicare Other | Admitting: Family

## 2018-02-27 NOTE — Progress Notes (Signed)
Patient ID: Thomas [name], male       DOB: [birth year], 83 y.o.      MRN [id number]  Entire visit was done in the presence of the sign language interpreter  HPI  Thomas [name] is a 83 y/o male with a history of CAD,HTN, CKD, PAF, peptic ulcer, bilateral deafness, previous tobacco use and chronic heart failure.   Echo report from 11/25/15 reviewed and showed an EF of 45-50% along with mild AR, moderate Thomas and mild-moderate TR.   Was in the ED 02/01/18 due to acute on chronic HF. Was given IV lasix and released. Was in the ED 12/30/17 due to neck pain. CT showed no neck abnormalities and patient was released.   He presents today for his initial visit with a chief complaint of minimal shortness of breath upon moderate exertion. He describes this as chronic in nature having been present for several years. He has associated fatigue, pedal edema, palpitations and easy bruising along with this. He denies any difficulty sleeping, abdominal distention, chest pain, cough or dizziness. Currently does not have any scales.   Past Medical History:  Diagnosis Date  . AICD (automatic cardioverter/defibrillator) present   . Biventricular ICD (implantable cardioverter-defibrillator) in place 03/24/2005   Implantation of a [employer] Adapta ADDRO1, serial number [id number]  . CHF (congestive heart failure) ([place])   . CKD (chronic kidney disease), stage III ([place])   . Coronary artery disease    a. s/p CABG 1986. b. Multiple PCIs/caths. c. 09/2013: s/p PTCA and BMS to SVG-OM.  [name]   . Dysrhythmia   . History of abdominal aortic aneurysm   . History of bleeding peptic ulcer 1980  . History of epididymitis 2013  . HTN (hypertension)   . Hydronephrosis with ureteropelvic junction obstruction   . Hydroureter on left 2009  . Hypertension   . Ischemic cardiomyopathy    a. Prior EF 30-35%, s/p BIV-ICD. b. 09/2013: EF 45-50%.  . Moderate tricuspid regurgitation   . PAF (paroxysmal atrial fibrillation) ([place])   .  Presence of permanent cardiac pacemaker   . Prostate cancer ([place])   . Status post coronary artery bypass grafting 1986   LIMA to the LAD, SVG to OM, SVG to RCA  . Testicular swelling    Past Surgical History:  Procedure Laterality Date  . 2-D echocardiogram  11/20/2011   Ejection fraction 30-35% moderate concentric left ventricular hypertrophy. Left atrium is moderately dilated. Mild Thomas. Mild or  . BI-VENTRICULAR IMPLANTABLE CARDIOVERTER DEFIBRILLATOR N/A 12/16/2012   Procedure: BI-VENTRICULAR IMPLANTABLE CARDIOVERTER DEFIBRILLATOR  (CRT-D);  Surgeon: [name], MD;  Location[facility] C[facility] LAB;  Service: Cardiovascular;  Laterality: N/A;  . CARDIAC CATHETERIZATION  12/10/2011   SVG to OM widely patent.  LIMA to LAD patent  . CATARACT EXTRACTION W/PHACO Right 10/12/2017   Procedure: CATARACT EXTRACTION PHACO AND INTRAOCULAR LENS PLACEMENT (IOC);  Surgeon: Birder Robson, MD;  Location: [facility] ORS;  Service: Ophthalmology;  Laterality: Right;  [place] 00:57 AP% 15.9 CDE 9.07 Fluid pack lot # [id number]  . CORONARY ARTERY BYPASS GRAFT  1986  . INSERT / REPLACE / REMOVE PACEMAKER    . LEFT HEART CATHETERIZATION WITH CORONARY/GRAFT ANGIOGRAM N/A 12/10/2011   Procedure: LEFT HEART CATHETERIZATION WITH Beatrix Fetters;  Surgeon: Sanda Klein, MD;  Location: [place] CATH LAB;  Service: Cardiovascular;  Laterality: N/A;  . LEFT HEART CATHETERIZATION WITH CORONARY/GRAFT ANGIOGRAM N/A 09/25/2013   Procedure: LEFT HEART CATHETERIZATION WITH Beatrix Fetters;  Surgeon: Blane Ohara, MD;  Location:  [place] CATH LAB;  Service: Cardiovascular;  Laterality: N/A;  . Persantine Myoview  05/06/2010   Post-rest ejection fraction 30%. No significant ischemia demonstrated. Compared to previous study there is no significant change.  . TRANSURETHRAL RESECTION OF PROSTATE     s/p   Family History  Problem Relation Age of Onset  . Hypertension Father    Social History   Tobacco Use  . Smoking status:  Former Smoker    Last attempt to quit: 03/15/1985    Years since quitting: 32.9  . Smokeless tobacco: Never Used  Substance Use Topics  . Alcohol use: No    Comment: occas.   Allergies  Allergen Reactions  . Phenazopyridine Nausea Only and Other (See Comments)    Other Reaction: GI UPSET  . Ramipril Other (See Comments)   Prior to Admission medications   Medication Sig Start Date End Date Taking? Authorizing Provider  albuterol (PROVENTIL HFA;VENTOLIN HFA) 108 (90 Base) MCG/ACT inhaler 2 puffs q.i.d. p.r.n. short of breath, wheezing, or cough 10/30/16  Yes [provider]  carvedilol (COREG) 12.5 MG tablet Take 12.5 mg by mouth 2 (two) times daily with a meal.   Yes [provider]  ELIQUIS 5 MG TABS tablet TAKE 1 TABLET (5 MG TOTAL) BY MOUTH 2 (TWO) TIMES DAILY. 10/05/17  Yes Croitoru, Mihai, MD  furosemide (LASIX) 80 MG tablet Take 1 tablet (80 mg total) by mouth daily. Patient taking differently: Take 40 mg by mouth daily.  03/08/17  Yes Croitoru, Mihai, MD  isosorbide mononitrate (IMDUR) 60 MG 24 hr tablet TAKE 1.5 TABLETS BY MOUTH DAILY.  01/21/18  Yes Croitoru, Mihai, MD  losartan-hydrochlorothiazide (HYZAAR) 50-12.5 MG tablet TAKE 1 TABLET BY MOUTH EVERY DAY  07/02/16  Yes [name], MD  potassium chloride (K-DUR) 10 MEQ tablet Take 3 tablets (30 mEq total) by mouth daily. Patient taking differently: Take 20 mEq by mouth daily.  11/30/16  Yes Croitoru, Mihai, MD  simvastatin (ZOCOR) 20 MG tablet TAKE 1 TABLET BY MOUTH DAILY 05/31/17  Yes Croitoru, Mihai, MD  vitamin B-12 (CYANOCOBALAMIN) 500 MCG tablet Take 500 mcg by mouth daily.   Yes [provider]  mirabegron ER (MYRBETRIQ) 50 MG TB24 tablet Take 1 tablet (50 mg total) by mouth daily. 12/13/17   Bjorn Loser, MD    Review of Systems  Constitutional: Positive for fatigue. Negative for appetite change.  HENT: Positive for hearing loss. Negative for congestion, rhinorrhea and sore throat.    Eyes: Negative.   Respiratory: Positive for shortness of breath (with moderate exertion). Negative for chest tightness.   Cardiovascular: Positive for palpitations (sometimes) and leg swelling. Negative for chest pain.  Gastrointestinal: Negative for abdominal distention and abdominal pain.  Endocrine: Negative.   Genitourinary: Negative.   Musculoskeletal: Positive for arthralgias (sometimes in legs). Negative for back pain.  Skin: Negative.   Allergic/Immunologic: Negative.   Neurological: Negative for dizziness and light-headedness.  Hematological: Negative for adenopathy. Bruises/bleeds easily.  Psychiatric/Behavioral: Negative for dysphoric mood and sleep disturbance (sleeping on 2 pillows). The patient is not nervous/anxious.     Vitals:   02/28/18 0904  BP: 135/72  Pulse: 91  Resp: 18  SpO2: 95%  Weight: 236 lb 6 oz (107.2 kg)  Height: 6\' 2"  (1.88 m)   Wt Readings from Last 3 Encounters:  02/28/18 236 lb 6 oz (107.2 kg)  02/01/18 245 lb (111.1 kg)  12/30/17 239 lb (108.4 kg)   Lab Results  Component Value Date   CREATININE  1.08 02/01/2018   CREATININE 1.18 12/30/2017   CREATININE 1.27 08/24/2016    Physical Exam Vitals signs and nursing note reviewed.  Constitutional:      Appearance: He is well-developed.  HENT:     Head: Normocephalic and atraumatic.     Ears:     Comments: Completely deaf in both ears Cardiovascular:     Rate and Rhythm: Normal rate and regular rhythm.  Pulmonary:     Effort: Pulmonary effort is normal.     Breath sounds: Normal breath sounds. No rhonchi or rales.  Abdominal:     Palpations: Abdomen is soft.     Tenderness: There is no abdominal tenderness.  Musculoskeletal:     Right lower leg: He exhibits no tenderness. Edema (2+ pitting) present.     Left lower leg: He exhibits no tenderness. Edema (2+ pitting) present.  Skin:    General: Skin is warm and dry.  Neurological:     Mental Status: He is alert and oriented to person,  place, and time.     Motor: No weakness.  Psychiatric:        Mood and Affect: Mood normal.        Behavior: Behavior normal.    Assessment & Plan:  1: Chronic heart failure with mildly reduced ejection fraction- - NYHA class II - euvolemic today although does have pedal edema - not weighing as he doesn't have scales; set of scales given to him today and he was instructed to weigh daily and to call for an overnight weight gain of >2 pounds or a weekly weight gain of >5 pounds - not adding salt and has recently started reading food labels. Reviewed the importance of closely following a 2000mg  sodium diet and written dietary information was given to him about this - drinks 1 cup of coffee and ~ 32 ounces of water daily - remains active and still works Tues-Friday - saw cardiology [name]) 07/12/17 - has ICD - EF >40% so would not qualify for entresto - BNP 02/01/18 was 400.0 - says that he hasn't received his flu vaccine yet  2: HTN- - BP looks good today - saw PCP Kary Kos) 10/13/16 - BMP from 02/01/18 reviewed and showed sodium 141, potassium 4.6, creatinine 1.08 and GFR >60  3: Lymphedema- - stage 2 - not elevating his legs much during the day as he still works 4 days / week; encouraged him to elevate them as much as possible - not wearing TED hose; explained the importance of getting a pair of those and putting them on first thing in the morning with removal at bedtime - limited in his ability to exercise due to his shortness of breath - brochure given on lymphapress compression boots and consider using them if above therapies do not work  Patient did not bring his medications nor a list. Each medication was verbally reviewed with the patient and he was encouraged to bring the bottles to every visit to confirm accuracy of list.  Return in 6 weeks or sooner for any questions/problems before then.   Entire visit was done in the presence of the sign language interpreter

## 2018-02-28 ENCOUNTER — Ambulatory Visit: Payer: Medicare Other | Attending: Family | Admitting: Family

## 2018-02-28 ENCOUNTER — Encounter: Payer: Self-pay | Admitting: Family

## 2018-02-28 VITALS — BP 135/72 | HR 91 | Resp 18 | Ht 74.0 in | Wt 236.4 lb

## 2018-02-28 DIAGNOSIS — K279 Peptic ulcer, site unspecified, unspecified as acute or chronic, without hemorrhage or perforation: Secondary | ICD-10-CM | POA: Diagnosis not present

## 2018-02-28 DIAGNOSIS — I130 Hypertensive heart and chronic kidney disease with heart failure and stage 1 through stage 4 chronic kidney disease, or unspecified chronic kidney disease: Secondary | ICD-10-CM | POA: Diagnosis not present

## 2018-02-28 DIAGNOSIS — Z7901 Long term (current) use of anticoagulants: Secondary | ICD-10-CM | POA: Insufficient documentation

## 2018-02-28 DIAGNOSIS — H9193 Unspecified hearing loss, bilateral: Secondary | ICD-10-CM | POA: Diagnosis not present

## 2018-02-28 DIAGNOSIS — I10 Essential (primary) hypertension: Secondary | ICD-10-CM

## 2018-02-28 DIAGNOSIS — I890 Lymphedema, not elsewhere classified: Secondary | ICD-10-CM | POA: Insufficient documentation

## 2018-02-28 DIAGNOSIS — Z951 Presence of aortocoronary bypass graft: Secondary | ICD-10-CM | POA: Insufficient documentation

## 2018-02-28 DIAGNOSIS — I5022 Chronic systolic (congestive) heart failure: Secondary | ICD-10-CM | POA: Diagnosis present

## 2018-02-28 DIAGNOSIS — I255 Ischemic cardiomyopathy: Secondary | ICD-10-CM | POA: Diagnosis not present

## 2018-02-28 DIAGNOSIS — Z8546 Personal history of malignant neoplasm of prostate: Secondary | ICD-10-CM | POA: Insufficient documentation

## 2018-02-28 DIAGNOSIS — Z95810 Presence of automatic (implantable) cardiac defibrillator: Secondary | ICD-10-CM | POA: Insufficient documentation

## 2018-02-28 DIAGNOSIS — Z79899 Other long term (current) drug therapy: Secondary | ICD-10-CM | POA: Insufficient documentation

## 2018-02-28 DIAGNOSIS — Z87891 Personal history of nicotine dependence: Secondary | ICD-10-CM | POA: Diagnosis not present

## 2018-02-28 DIAGNOSIS — N183 Chronic kidney disease, stage 3 (moderate): Secondary | ICD-10-CM | POA: Insufficient documentation

## 2018-02-28 DIAGNOSIS — I2510 Atherosclerotic heart disease of native coronary artery without angina pectoris: Secondary | ICD-10-CM | POA: Diagnosis not present

## 2018-02-28 DIAGNOSIS — R0602 Shortness of breath: Secondary | ICD-10-CM | POA: Diagnosis present

## 2018-02-28 DIAGNOSIS — I480 Paroxysmal atrial fibrillation: Secondary | ICD-10-CM | POA: Insufficient documentation

## 2018-02-28 NOTE — Patient Instructions (Addendum)
Begin weighing daily and call for an overnight weight gain of > 2 pounds or a weekly weight gain of >5 pounds. 

## 2018-03-14 ENCOUNTER — Telehealth: Payer: Self-pay | Admitting: Cardiovascular Disease

## 2018-03-14 ENCOUNTER — Emergency Department (HOSPITAL_COMMUNITY): Payer: Medicare Other

## 2018-03-14 ENCOUNTER — Encounter (HOSPITAL_COMMUNITY): Payer: Self-pay | Admitting: Emergency Medicine

## 2018-03-14 ENCOUNTER — Emergency Department (HOSPITAL_COMMUNITY)
Admission: EM | Admit: 2018-03-14 | Discharge: 2018-03-14 | Disposition: A | Discharge status: Home / Self Care | Payer: Medicare Other | Attending: Emergency Medicine | Admitting: Emergency Medicine

## 2018-03-14 ENCOUNTER — Other Ambulatory Visit: Payer: Self-pay

## 2018-03-14 DIAGNOSIS — Z7901 Long term (current) use of anticoagulants: Secondary | ICD-10-CM | POA: Diagnosis not present

## 2018-03-14 DIAGNOSIS — Z951 Presence of aortocoronary bypass graft: Secondary | ICD-10-CM | POA: Diagnosis not present

## 2018-03-14 DIAGNOSIS — I5022 Chronic systolic (congestive) heart failure: Secondary | ICD-10-CM | POA: Insufficient documentation

## 2018-03-14 DIAGNOSIS — R0789 Other chest pain: Secondary | ICD-10-CM | POA: Diagnosis present

## 2018-03-14 DIAGNOSIS — I130 Hypertensive heart and chronic kidney disease with heart failure and stage 1 through stage 4 chronic kidney disease, or unspecified chronic kidney disease: Secondary | ICD-10-CM | POA: Insufficient documentation

## 2018-03-14 DIAGNOSIS — Z79899 Other long term (current) drug therapy: Secondary | ICD-10-CM | POA: Insufficient documentation

## 2018-03-14 DIAGNOSIS — R609 Edema, unspecified: Secondary | ICD-10-CM

## 2018-03-14 DIAGNOSIS — M6281 Muscle weakness (generalized): Secondary | ICD-10-CM | POA: Insufficient documentation

## 2018-03-14 DIAGNOSIS — R600 Localized edema: Secondary | ICD-10-CM | POA: Diagnosis not present

## 2018-03-14 DIAGNOSIS — Z8546 Personal history of malignant neoplasm of prostate: Secondary | ICD-10-CM | POA: Diagnosis not present

## 2018-03-14 DIAGNOSIS — Z95810 Presence of automatic (implantable) cardiac defibrillator: Secondary | ICD-10-CM | POA: Insufficient documentation

## 2018-03-14 DIAGNOSIS — N183 Chronic kidney disease, stage 3 (moderate): Secondary | ICD-10-CM | POA: Insufficient documentation

## 2018-03-14 DIAGNOSIS — Z87891 Personal history of nicotine dependence: Secondary | ICD-10-CM | POA: Insufficient documentation

## 2018-03-14 DIAGNOSIS — M79602 Pain in left arm: Secondary | ICD-10-CM | POA: Diagnosis not present

## 2018-03-14 LAB — CBC WITH DIFFERENTIAL/PLATELET
ABS IMMATURE GRANULOCYTES: 0.01 10*3/uL (ref 0.00–0.07)
BASOS ABS: 0.1 10*3/uL (ref 0.0–0.1)
BASOS PCT: 1 %
Eosinophils Absolute: 0.3 10*3/uL (ref 0.0–0.5)
Eosinophils Relative: 5 %
HCT: 31.5 % — ABNORMAL LOW (ref 39.0–52.0)
Hemoglobin: 9.5 g/dL — ABNORMAL LOW (ref 13.0–17.0)
Immature Granulocytes: 0 %
Lymphocytes Relative: 17 %
Lymphs Abs: 0.9 10*3/uL (ref 0.7–4.0)
MCH: 27 pg (ref 26.0–34.0)
MCHC: 30.2 g/dL (ref 30.0–36.0)
MCV: 89.5 fL (ref 80.0–100.0)
MONO ABS: 0.8 10*3/uL (ref 0.1–1.0)
Monocytes Relative: 15 %
NEUTROS ABS: 3.2 10*3/uL (ref 1.7–7.7)
NRBC: 0 % (ref 0.0–0.2)
Neutrophils Relative %: 62 %
PLATELETS: 200 10*3/uL (ref 150–400)
RBC: 3.52 MIL/uL — AB (ref 4.22–5.81)
RDW: 15.4 % (ref 11.5–15.5)
WBC: 5.2 10*3/uL (ref 4.0–10.5)

## 2018-03-14 LAB — BASIC METABOLIC PANEL
Anion gap: 8 (ref 5–15)
BUN: 26 mg/dL — AB (ref 8–23)
CO2: 24 mmol/L (ref 22–32)
CREATININE: 1.3 mg/dL — AB (ref 0.61–1.24)
Calcium: 8.6 mg/dL — ABNORMAL LOW (ref 8.9–10.3)
Chloride: 109 mmol/L (ref 98–111)
GFR calc Af Amer: 59 mL/min — ABNORMAL LOW (ref >60)
GFR, EST NON AFRICAN AMERICAN: 51 mL/min — AB (ref >60)
Glucose, Bld: 94 mg/dL (ref 70–99)
Potassium: 4.1 mmol/L (ref 3.5–5.1)
Sodium: 141 mmol/L (ref 135–145)

## 2018-03-14 LAB — I-STAT TROPONIN, ED: TROPONIN I, POC: 0.03 ng/mL (ref 0.00–0.08)

## 2018-03-14 LAB — I-STAT CHEM 8, ED
BUN: 26 mg/dL — AB (ref 8–23)
CALCIUM ION: 1.19 mmol/L (ref 1.15–1.40)
CHLORIDE: 109 mmol/L (ref 98–111)
CREATININE: 1.2 mg/dL (ref 0.61–1.24)
GLUCOSE: 88 mg/dL (ref 70–99)
HCT: 30 % — ABNORMAL LOW (ref 39.0–52.0)
Hemoglobin: 10.2 g/dL — ABNORMAL LOW (ref 13.0–17.0)
Potassium: 4.1 mmol/L (ref 3.5–5.1)
Sodium: 142 mmol/L (ref 135–145)
TCO2: 24 mmol/L (ref 22–32)

## 2018-03-14 LAB — MAGNESIUM: Magnesium: 2.1 mg/dL (ref 1.7–2.4)

## 2018-03-14 LAB — BRAIN NATRIURETIC PEPTIDE: B Natriuretic Peptide: 375.8 pg/mL — ABNORMAL HIGH (ref 0.0–100.0)

## 2018-03-14 MED ORDER — FUROSEMIDE 10 MG/ML IJ SOLN
80.0000 mg | Freq: Once | INTRAMUSCULAR | Status: AC
  Administered 2018-03-14: 80 mg via INTRAVENOUS
  Filled 2018-03-14: qty 8

## 2018-03-14 NOTE — ED Notes (Signed)
Interpreter pad used for entirety of assessment and triage.

## 2018-03-14 NOTE — ED Provider Notes (Signed)
[facility] EMERGENCY DEPARTMENT Provider Note   CSN: [id number] Arrival date & time: 03/14/18  1649     History   Chief Complaint Chief Complaint  Patient presents with  . Chest Pain  . Leg Swelling    HPI Thomas [name] is a 83 y.o. male.  83 yo M with a chief complaint of left arm and leg cramping.  Patient is been having slowly worsening lower extremity edema over the past 3 months or so.  Now starting to have some trouble getting around the house for the past couple days.  Feeling weak.  Feels that the left arm cramping goes into his chest.  Denies shortness of breath with this.  Has not really been able to exert himself so unsure if this makes it worse.  Denies abdominal pain cough congestion fevers chills or myalgias.  Denies any recent change in his medication.  He saw his cardiologist in the office a couple weeks ago with no significant changes.  Baseline BNP at that time was 400.  He called the cardiologist today with the cramping in his arm and leg and was suggested that he call his family doctor and the family called 84 and came to the hospital.  The history is provided by the patient and the spouse.  Chest Pain   Associated symptoms include weakness (generalized). Pertinent negatives include no abdominal pain, no fever, no headaches, no palpitations, no shortness of breath and no vomiting.  Illness  This is a new problem. The current episode started 2 days ago. The problem occurs constantly. The problem has not changed since onset.Associated symptoms include chest pain. Pertinent negatives include no abdominal pain, no headaches and no shortness of breath. Nothing aggravates the symptoms. Nothing relieves the symptoms. He has tried nothing for the symptoms. The treatment provided no relief.    Past Medical History:  Diagnosis Date  . AICD (automatic cardioverter/defibrillator) present   . Biventricular ICD (implantable cardioverter-defibrillator) in  place 03/24/2005   Implantation of a Medtronic Adapta ADDRO1, serial number [id number]  . CHF (congestive heart failure) ([place])   . CKD (chronic kidney disease), stage III ([place])   . Coronary artery disease    a. s/p CABG 1986. b. Multiple PCIs/caths. c. 09/2013: s/p PTCA and BMS to SVG-OM.  [name]   . Dysrhythmia   . History of abdominal aortic aneurysm   . History of bleeding peptic ulcer 1980  . History of epididymitis 2013  . HTN (hypertension)   . Hydronephrosis with ureteropelvic junction obstruction   . Hydroureter on left 2009  . Hypertension   . Ischemic cardiomyopathy    a. Prior EF 30-35%, s/p BIV-ICD. b. 09/2013: EF 45-50%.  . Moderate tricuspid regurgitation   . PAF (paroxysmal atrial fibrillation) ([place])   . Presence of permanent cardiac pacemaker   . Prostate cancer ([place])   . Status post coronary artery bypass grafting 1986   LIMA to the LAD, SVG to OM, SVG to RCA  . Testicular swelling     Patient Active Problem List   Diagnosis Date Noted  . Lymphedema 02/28/2018  . Weakness 07/15/2016  . Fatigue 07/15/2016  . Diarrhea 07/15/2016  . CVA (cerebral infarction) 10/31/2015  . Bulbous urethral stricture 09/18/2015  . Bilateral deafness 08/19/2015  . Bilateral cataracts 08/19/2015  . Acid reflux 08/19/2015  . HLD (hyperlipidemia) 08/19/2015  . BP (high blood pressure) 08/19/2015  . Myocardial infarction ([place]) 08/19/2015  . Calculus of kidney 08/19/2015  . Artificial  cardiac pacemaker 08/19/2015  . Gastroduodenal ulcer 08/19/2015  . Dupuytren's contracture of foot 08/19/2015  . Malignant neoplasm of prostate ([place]) 08/19/2015  . Microhematuria 08/19/2015  . Coronary artery disease 02/22/2015  . Status post coronary artery bypass grafting 02/22/2015  . Benign essential HTN 04/02/2014  . Hematochezia 02/20/2014  . Moderate tricuspid regurgitation 02/20/2014  . Mobitz type II atrioventricular block 12/12/2013  . Long term current use of anticoagulant 10/18/2013    . Cardiomyopathy, ischemic: Ejection fraction 45% 11/11/2012  . CAD Status post coronary artery bypass grafting: 1995 11/11/2012  . Permanent atrial fibrillation ([place]) 11/11/2012  . Biventricular ICD Medtronic Viva single chamber October 2014 11/11/2012  . Chronic systolic heart failure ([place]) 11/11/2012    Past Surgical History:  Procedure Laterality Date  . 2-D echocardiogram  11/20/2011   Ejection fraction 30-35% moderate concentric left ventricular hypertrophy. Left atrium is moderately dilated. Mild MR. Mild or  . BI-VENTRICULAR IMPLANTABLE CARDIOVERTER DEFIBRILLATOR N/A 12/16/2012   Procedure: BI-VENTRICULAR IMPLANTABLE CARDIOVERTER DEFIBRILLATOR  (CRT-D);  Surgeon: [name], MD;  Location: [facility] CATH LAB;  Service: Cardiovascular;  Laterality: N/A;  . CARDIAC CATHETERIZATION  12/10/2011   SVG to OM widely patent.  LIMA to LAD patent  . CATARACT EXTRACTION W/PHACO Right 10/12/2017   Procedure: CATARACT EXTRACTION PHACO AND INTRAOCULAR LENS PLACEMENT (IOC);  Surgeon: Birder Robson, MD;  Location: [facility] ORS;  Service: Ophthalmology;  Laterality: Right;  [place] 00:57 AP% 15.9 CDE 9.07 Fluid pack lot # [id number]  . CORONARY ARTERY BYPASS GRAFT  1986  . INSERT / REPLACE / REMOVE PACEMAKER    . LEFT HEART CATHETERIZATION WITH CORONARY/GRAFT ANGIOGRAM N/A 12/10/2011   Procedure: LEFT HEART CATHETERIZATION WITH Beatrix Fetters;  Surgeon: Sanda Klein, MD;  Location: [facility] CATH LAB;  Service: Cardiovascular;  Laterality: N/A;  . LEFT HEART CATHETERIZATION WITH CORONARY/GRAFT ANGIOGRAM N/A 09/25/2013   Procedure: LEFT HEART CATHETERIZATION WITH Beatrix Fetters;  Surgeon: Blane Ohara, MD;  Location: [facility] CATH LAB;  Service: Cardiovascular;  Laterality: N/A;  . Persantine Myoview  05/06/2010   Post-rest ejection fraction 30%. No significant ischemia demonstrated. Compared to previous study there is no significant change.  . TRANSURETHRAL RESECTION OF PROSTATE     s/p         Home Medications    Prior to Admission medications   Medication Sig Start Date End Date Taking? Authorizing Provider  albuterol (PROVENTIL HFA;VENTOLIN HFA) 108 (90 Base) MCG/ACT inhaler 2 puffs q.i.d. p.r.n. short of breath, wheezing, or cough 10/30/16   [provider]  carvedilol (COREG) 12.5 MG tablet Take 12.5 mg by mouth 2 (two) times daily with a meal.    [provider]  ELIQUIS 5 MG TABS tablet TAKE 1 TABLET (5 MG TOTAL) BY MOUTH 2 (TWO) TIMES DAILY. 10/05/17   Croitoru, Mihai, MD  furosemide (LASIX) 80 MG tablet Take 1 tablet (80 mg total) by mouth daily. Patient taking differently: Take 40 mg by mouth daily.  03/08/17   Croitoru, Mihai, MD  isosorbide mononitrate (IMDUR) 60 MG 24 hr tablet TAKE 1.5 TABLETS BY MOUTH DAILY. **PLEASE CALL TO MAKE APPOINTMENT FOR FURTHER REFILLS 01/21/18   Croitoru, [name] Gobble, MD  losartan-hydrochlorothiazide (HYZAAR) 50-12.5 MG tablet TAKE 1 TABLET BY MOUTH EVERY DAY **NEED OFFICE VISIT** 07/02/16   [name], MD  mirabegron ER (MYRBETRIQ) 50 MG TB24 tablet Take 1 tablet (50 mg total) by mouth daily. 12/13/17   Bjorn Loser, MD  potassium chloride (K-DUR) 10 MEQ tablet Take 3 tablets (30 mEq total)  by mouth daily. Patient taking differently: Take 20 mEq by mouth daily.  11/30/16   Croitoru, Mihai, MD  simvastatin (ZOCOR) 20 MG tablet TAKE 1 TABLET BY MOUTH DAILY 05/31/17   Croitoru, Mihai, MD  vitamin B-12 (CYANOCOBALAMIN) 500 MCG tablet Take 500 mcg by mouth daily.    [provider]    Family History Family History  Problem Relation Age of Onset  . Hypertension Father     Social History Social History   Tobacco Use  . Smoking status: Former Smoker    Last attempt to quit: 03/15/1985    Years since quitting: 33.0  . Smokeless tobacco: Never Used  Substance Use Topics  . Alcohol use: No    Comment: occas.  . Drug use: No     Allergies   Phenazopyridine and Ramipril   Review of Systems Review  of Systems  Constitutional: Negative for chills and fever.  HENT: Negative for congestion and facial swelling.   Eyes: Negative for discharge and visual disturbance.  Respiratory: Negative for shortness of breath.   Cardiovascular: Positive for chest pain. Negative for palpitations.  Gastrointestinal: Negative for abdominal pain, diarrhea and vomiting.  Musculoskeletal: Positive for myalgias (left arm and leg pain). Negative for arthralgias.  Skin: Negative for color change and rash.  Neurological: Positive for weakness (generalized). Negative for tremors, syncope and headaches.  Psychiatric/Behavioral: Negative for confusion and dysphoric mood.     Physical Exam Updated Vital Signs BP (!) 141/85   Pulse 76   Temp 98.1 F (36.7 C) (Oral)   Resp 20   Ht 6\' 2"  (1.88 m)   Wt 104.7 kg   SpO2 97%   BMI 29.65 kg/m   Physical Exam Vitals signs and nursing note reviewed.  Constitutional:      Appearance: He is well-developed.  HENT:     Head: Normocephalic and atraumatic.  Eyes:     Pupils: Pupils are equal, round, and reactive to light.  Neck:     Musculoskeletal: Normal range of motion and neck supple.     Vascular: JVD ( To the angle of the jaw) present.  Cardiovascular:     Rate and Rhythm: Normal rate and regular rhythm.     Heart sounds: No murmur. No friction rub. No gallop.   Pulmonary:     Effort: No respiratory distress.     Breath sounds: Rales (trace at bases) present. No wheezing.  Abdominal:     General: There is no distension.     Tenderness: There is no guarding or rebound.  Musculoskeletal: Normal range of motion.     Right lower leg: Edema present.     Left lower leg: Edema present.     Comments: 3+ edema to the thighs bilaterally  Skin:    Coloration: Skin is not pale.     Findings: No rash.  Neurological:     Mental Status: He is alert and oriented to person, place, and time.  Psychiatric:        Behavior: Behavior normal.      ED Treatments /  Results  Labs (all labs ordered are listed, but only abnormal results are displayed) Labs Reviewed  CBC WITH DIFFERENTIAL/PLATELET - Abnormal; Notable for the following components:      Result Value   RBC 3.52 (*)    Hemoglobin 9.5 (*)    HCT 31.5 (*)    All other components within normal limits  BASIC METABOLIC PANEL - Abnormal; Notable for the following components:  BUN 26 (*)    Creatinine, Ser 1.30 (*)    Calcium 8.6 (*)    GFR calc non Af Amer 51 (*)    GFR calc Af Amer 59 (*)    All other components within normal limits  BRAIN NATRIURETIC PEPTIDE - Abnormal; Notable for the following components:   B Natriuretic Peptide 375.8 (*)    All other components within normal limits  I-STAT CHEM 8, ED - Abnormal; Notable for the following components:   BUN 26 (*)    Hemoglobin 10.2 (*)    HCT 30.0 (*)    All other components within normal limits  MAGNESIUM  I-STAT TROPONIN, ED    EKG EKG Interpretation  Date/Time:  Monday March 14 2018 17:01:32 EST Ventricular Rate:  70 PR Interval:    QRS Duration: 148 QT Interval:  459 QTC Calculation: 496 R Axis:   -62 Text Interpretation:  Afib/flut and V-paced complexes No further analysis attempted due to paced rhythm No significant change since last tracing Confirmed by Deno Etienne [id number]) on 03/14/2018 6:59:10 PM   Radiology Dg Chest 2 View  Result Date: 03/14/2018 CLINICAL DATA:  Shortness of breath. Left-sided chest pain. Left arm pain. EXAM: CHEST - 2 VIEW COMPARISON:  Chest x-ray dated 02/01/2018 FINDINGS: There is chronic cardiomegaly. CABG. AICD in place. Pacemaker in place. Pulmonary vascularity is normal. Small bilateral pleural effusions, unchanged. Aortic atherosclerosis. No acute bone abnormality. Moderate arthritis of the right glenohumeral joint. IMPRESSION: 1. No significant change since the prior study. Small bilateral pleural effusions. Chronic cardiomegaly. 2.  Aortic Atherosclerosis (ICD10-I70.0). Electronically  Signed   By: Lorriane Shire M.D.   On: 03/14/2018 18:57    Procedures Procedures (including critical care time)  Medications Ordered in ED Medications  furosemide (LASIX) injection 80 mg (80 mg Intravenous Given 03/14/18 1918)     Initial Impression / Assessment and Plan / ED Course  I have reviewed the triage vital signs and the nursing notes.  Pertinent labs & imaging results that were available during my care of the patient were reviewed by me and considered in my medical decision making (see chart for details).     83 yo M with a chief complaint of lower extremity edema.  Going on for the past 3 months but worsening over the past few days.  Patient is having trouble getting up and moving around in the house.  No explicit shortness of breath.  Has been having left-sided muscle spasms in the arm and the leg.  Also extending into the chest.  His troponin here is negative.  He has a small bump in his creatinine.  I feel that clinically he is having likely worsening fluid overload.  He has had some significant weight gain at home.  Not doing well with his home diuretic therapy and he is clinically deaf make it hard for him to communicate with his provider.  I will discuss with cardiology here.  I discussed the case with Dr. Prospect Lions.  I updated him that the patient called the office earlier and he was doing much better now.  He is ambulated multiple times in the ED without assistance.  Has had some good urine output after being dosed with Lasix.  Per nursing he mentioned to them that he actually has been noncompliant with his medications recently.  That makes this the more likely cause of his fluid overload.  We will have him double his dose for the next couple days and call his cardiologist  in the morning.  I feel this is completely atypical of ACS, initial troponin was negative.  I do not feel this needs to be repeated.  His pacemaker was interrogated without concerning event within the  last few months.  8:12 PM:  I have discussed the diagnosis/risks/treatment options with the patient and family and believe the pt to be eligible for discharge home to follow-up with PCP, cards. We also discussed returning to the ED immediately if new or worsening sx occur. We discussed the sx which are most concerning (e.g., sudden worsening pain, fever, inability to tolerate by mouth) that necessitate immediate return. Medications administered to the patient during their visit and any new prescriptions provided to the patient are listed below.  Medications given during this visit Medications  furosemide (LASIX) injection 80 mg (80 mg Intravenous Given 03/14/18 1918)     The patient appears reasonably screen and/or stabilized for discharge and I doubt any other medical condition or other [facility] requiring further screening, evaluation, or treatment in the ED at this time prior to discharge.    Final Clinical Impressions(s) / ED Diagnoses   Final diagnoses:  Peripheral edema    ED Discharge Orders    None       Deno Etienne, DO 03/14/18 2012

## 2018-03-14 NOTE — Telephone Encounter (Signed)
SPOKE TO WIFE THROUGH INTERPRTER- PATIENT IS NOT HAVING ANY CHEST DISCOMFORT, DISCOMFORT ON LEFT ARM , LEFT SIDE AND TO HIS KNEE.  INFORMED WIFE TO CONTACT  PRIMARY  FOR SYMPTOMS, DUE TO SYMPTOMS BEING VAGUE  IN NATURE. SHE VOICE Thomas [name]

## 2018-03-14 NOTE — Telephone Encounter (Signed)
  Wife is calling back through interpretor regarding patient having left arm pain.

## 2018-03-14 NOTE — ED Notes (Signed)
Interpreter at [employer]

## 2018-03-14 NOTE — Discharge Instructions (Signed)
Double your Lasix for the next couple days or until you hear different from your cardiologist.  Return for worsening shortness of breath or fatigue.

## 2018-03-14 NOTE — ED Triage Notes (Signed)
Pt arrived [facility] from home with c/o Left chest pain and arm pain that the pt believes is related to arthritis. He states he was just watching tv and began having a lot of pain in his left arm. Pt also c/o BLE swelling but reports that he saw his doctor and was prescribed lasix. States that it helps with the swelling but that he doesn't like going to the bathroom so much so he hasn't been taking it like he is supposed to.

## 2018-03-14 NOTE — ED Notes (Signed)
PT back from xray

## 2018-03-14 NOTE — ED Notes (Signed)
Patient transported to X-ray 

## 2018-03-14 NOTE — ED Notes (Addendum)
Patient denies chest pain and is resting comfortably.

## 2018-03-14 NOTE — Telephone Encounter (Signed)
New Message   Patients wife is calling on his behalf. She states that he is complaining of pain in his left arm. He is not short of breath and no chest pain. Just concern that it may be his heart. It runs from the finger tips all the way do to the knee.  Please call to discuss. It just started today.

## 2018-03-14 NOTE — ED Notes (Signed)
ED Provider at bedside. 

## 2018-03-15 ENCOUNTER — Telehealth: Payer: Self-pay | Admitting: Internal Medicine

## 2018-03-15 ENCOUNTER — Telehealth: Payer: Self-pay

## 2018-03-15 ENCOUNTER — Telehealth: Payer: Self-pay | Admitting: Cardiovascular Disease

## 2018-03-15 NOTE — Telephone Encounter (Signed)
New Message   Pt c/o medication issue:  1. Name of Medication: furosemide (LASIX) 80 MG tablet  2. How are you currently taking this medication (dosage and times per day)? 40mg  1x daily   3. Are you having a reaction (difficulty breathing--STAT)? No   4. What is your medication issue? Pt was seen at hospital 03/14/18 for fluid and left arm pain. Has questions on medication

## 2018-03-15 NOTE — Telephone Encounter (Signed)
  Wife is calling again and stated that when we call back that if there is no answer please leave a message with instructions

## 2018-03-15 NOTE — Telephone Encounter (Signed)
Follow up   Per Sonia Baller Needs to speak to Dr croitoru's nurse

## 2018-03-15 NOTE — Telephone Encounter (Signed)
Spoke with pt and pt wife through interpreter to relay Dr. Loletha Grayer message to continue taking lasix as prescribed and return for office visit with APP. Per interpreter, pt and wife agreeable with plan. Scheduled for appt 03/28/2018 at 3p with Jory Sims, NP

## 2018-03-15 NOTE — Telephone Encounter (Signed)
New message     Pt returning call . Pt didn't understand if they should keep taking medication.

## 2018-03-15 NOTE — Telephone Encounter (Signed)
Returned call to patient thru interpreter. Patient states he cannot take lasix 80mg  in the morning because he has to work but can take 80mg  at night. Advised patient how to take medication, as prescription reads lasix 80mg  QAM and 40mg  QPM, as previously relayed by Chima, RN around 2:50pm today - which was confirmed by MD. Advised patient that I cannot tell make him take his medication this way at home, but I can provide the information per the provider on the prescription.

## 2018-03-15 NOTE — Telephone Encounter (Signed)
Please continue this dose and make appt w APP next week or so MCr

## 2018-03-15 NOTE — Telephone Encounter (Signed)
Returned call to patient talked to interpreter patient had swelling in left arm and left leg with cramping in left leg.Patient went to [facility] ED yesterday received IV Lasix.Stated swelling has improved.No cramping.Patient taking Lasix 80 mg in am and 40 mg in pm.Patient was told to ask Dr.Croitoru if he should continue this dose or if he needs to increase.He also wants to know if he needs appointment with Dr.Croitoru.Message sent to Dr.Croitoru for advice.

## 2018-03-20 LAB — CUP PACEART REMOTE DEVICE CHECK
Brady Statistic AP VS Percent: 0 %
Brady Statistic AS VP Percent: 97.51 %
Brady Statistic AS VS Percent: 2.49 %
Brady Statistic RA Percent Paced: 0 %
HIGH POWER IMPEDANCE MEASURED VALUE: 69 Ohm
Implantable Lead Implant Date: 20000210
Implantable Lead Location: 753859
Implantable Lead Model: 4244
Implantable Lead Model: 4285
Implantable Lead Serial Number: 272469
Implantable Lead Serial Number: 413633
Implantable Pulse Generator Implant Date: 20141003
Lead Channel Impedance Value: 285 Ohm
Lead Channel Impedance Value: 380 Ohm
Lead Channel Impedance Value: 399 Ohm
Lead Channel Impedance Value: 646 Ohm
Lead Channel Impedance Value: 646 Ohm
Lead Channel Impedance Value: 665 Ohm
Lead Channel Impedance Value: 703 Ohm
Lead Channel Pacing Threshold Amplitude: 1.25 V
Lead Channel Pacing Threshold Pulse Width: 0.4 ms
Lead Channel Pacing Threshold Pulse Width: 0.6 ms
Lead Channel Sensing Intrinsic Amplitude: 0.875 mV
Lead Channel Sensing Intrinsic Amplitude: 0.875 mV
Lead Channel Sensing Intrinsic Amplitude: 4.375 mV
Lead Channel Setting Pacing Amplitude: 2.25 V
Lead Channel Setting Pacing Pulse Width: 0.4 ms
Lead Channel Setting Sensing Sensitivity: 0.3 mV
MDC IDC LEAD IMPLANT DT: 20000210
MDC IDC LEAD IMPLANT DT: 20141003
MDC IDC LEAD LOCATION: 753858
MDC IDC LEAD LOCATION: 753860
MDC IDC MSMT BATTERY REMAINING LONGEVITY: 18 mo
MDC IDC MSMT BATTERY VOLTAGE: 2.91 V
MDC IDC MSMT LEADCHNL LV IMPEDANCE VALUE: 342 Ohm
MDC IDC MSMT LEADCHNL LV IMPEDANCE VALUE: 380 Ohm
MDC IDC MSMT LEADCHNL LV IMPEDANCE VALUE: 399 Ohm
MDC IDC MSMT LEADCHNL LV IMPEDANCE VALUE: 437 Ohm
MDC IDC MSMT LEADCHNL LV IMPEDANCE VALUE: 608 Ohm
MDC IDC MSMT LEADCHNL RV IMPEDANCE VALUE: 342 Ohm
MDC IDC MSMT LEADCHNL RV PACING THRESHOLD AMPLITUDE: 0.75 V
MDC IDC MSMT LEADCHNL RV SENSING INTR AMPL: 4.375 mV
MDC IDC SESS DTM: 20191105062404
MDC IDC SET LEADCHNL LV PACING PULSEWIDTH: 0.6 ms
MDC IDC SET LEADCHNL RV PACING AMPLITUDE: 2 V
MDC IDC STAT BRADY AP VP PERCENT: 0 %
MDC IDC STAT BRADY RV PERCENT PACED: 98 %

## 2018-03-27 NOTE — Progress Notes (Signed)
Cardiology Office Note   Date:  03/28/2018   ID:  Thomas [name], DOB 08-Nov-1934, MRN [id number]  PCP:  [name], MD  Cardiologist:  Dr.Taylor No chief complaint on file.    History of Present Illness: Thomas [name] is a 84 y.o. male who presents for ongoing assessment and management of ICM, chronic systolic CHF EF 30%,, ICD in situ (BiV [id number]), continues to struggle with low sodium diet. He was last seen by Dr. [name] on 07/12/2017 and was advised to increase lasix for a few days due to LE and then return to normal regimen.   He was seen in the hospital on 03/14/2018 for fluid overload and left arm pain which had lasted for 2 days. He was having left arm spasms in the arm and leg. He is clinically deaf with difficult communication. He admitted to medical non-compliance He was asked to double his dose of lasix for a few days and call to make an appointment. Pacemaker was interrogated and not found to have concerning events. He was treated with IV lasix 80 mg X 1.   He has gotten confused on his lasix and took 80 mg BID yesterday. He states that he just wasn't sure that he took it and therefore took an additional one to avoid recurrence of fluid.  He is deaf and interaction is cumbersome as we are using an interpretor  He and his wife have multiple questions about his diet and also about his medications.   Past Medical History:  Diagnosis Date  . AICD (automatic cardioverter/defibrillator) present   . Biventricular ICD (implantable cardioverter-defibrillator) in place 03/24/2005   Implantation of a Medtronic Adapta ADDRO1, serial number [id number]  . CHF (congestive heart failure) ([place])   . CKD (chronic kidney disease), stage III ([place])   . Coronary artery disease    a. s/p CABG 1986. b. Multiple PCIs/caths. c. 09/2013: s/p PTCA and BMS to SVG-OM.  [name]   . Dysrhythmia   . History of abdominal aortic aneurysm   . History of bleeding peptic ulcer 1980  . History of epididymitis  2013  . HTN (hypertension)   . Hydronephrosis with ureteropelvic junction obstruction   . Hydroureter on left 2009  . Hypertension   . Ischemic cardiomyopathy    a. Prior EF 30-35%, s/p BIV-ICD. b. 09/2013: EF 45-50%.  . Moderate tricuspid regurgitation   . PAF (paroxysmal atrial fibrillation) ([place])   . Presence of permanent cardiac pacemaker   . Prostate cancer ([place])   . Status post coronary artery bypass grafting 1986   LIMA to the LAD, SVG to OM, SVG to RCA  . Testicular swelling     Past Surgical History:  Procedure Laterality Date  . 2-D echocardiogram  11/20/2011   Ejection fraction 30-35% moderate concentric left ventricular hypertrophy. Left atrium is moderately dilated. Mild MR. Mild or  . BI-VENTRICULAR IMPLANTABLE CARDIOVERTER DEFIBRILLATOR N/A 12/16/2012   Procedure: BI-VENTRICULAR IMPLANTABLE CARDIOVERTER DEFIBRILLATOR  (CRT-D);  Surgeon: [name], MD;  Location: [facility] C[facility] LAB;  Service: Cardiovascular;  Laterality: N/A;  . CARDIAC CATHETERIZATION  12/10/2011   SVG to OM widely patent.  LIMA to LAD patent  . CATARACT EXTRACTION W/PHACO Right 10/12/2017   Procedure: CATARACT EXTRACTION PHACO AND INTRAOCULAR LENS PLACEMENT (IOC);  Surgeon: Birder Robson, MD;  Location: [facility] ORS;  Service: Ophthalmology;  Laterality: Right;  [place] 00:57 AP% 15.9 CDE 9.07 Fluid pack lot # [id number]  . CORONARY ARTERY BYPASS GRAFT  1986  . INSERT /  REPLACE / REMOVE PACEMAKER    . LEFT HEART CATHETERIZATION WITH CORONARY/GRAFT ANGIOGRAM N/A 12/10/2011   Procedure: LEFT HEART CATHETERIZATION WITH Beatrix Fetters;  Surgeon: Sanda Klein, MD;  Location: [facility] CATH LAB;  Service: Cardiovascular;  Laterality: N/A;  . LEFT HEART CATHETERIZATION WITH CORONARY/GRAFT ANGIOGRAM N/A 09/25/2013   Procedure: LEFT HEART CATHETERIZATION WITH Beatrix Fetters;  Surgeon: Blane Ohara, MD;  Location: [facility] CATH LAB;  Service: Cardiovascular;  Laterality: N/A;  . Persantine Myoview  05/06/2010     Post-rest ejection fraction 30%. No significant ischemia demonstrated. Compared to previous study there is no significant change.  . TRANSURETHRAL RESECTION OF PROSTATE     s/p       Allergies:   Phenazopyridine and Ramipril    Social History:  The patient  reports that he quit smoking about 33 years ago. He has never used smokeless tobacco. He reports that he does not drink alcohol or use drugs.   Family History:  The patient's family history includes Hypertension in his father.    ROS: All other systems are reviewed and negative. Unless otherwise mentioned in H&P    PHYSICAL EXAM: VS:  BP (!) 112/58   Pulse 73   Ht 6\' 2"  (1.88 m)   Wt 227 lb 3.2 oz (103.1 kg)   SpO2 95%   BMI 29.17 kg/m  , BMI Body mass index is 29.17 kg/m. GEN: Well nourished, well developed, in no acute distress HEENT: normal Neck: no JVD, carotid bruits, or masses Cardiac: RRR; no murmurs, rubs, or gallops,no edema  Respiratory:  Clear to auscultation bilaterally, normal work of breathing GI: soft, nontender, nondistended, + BS MS: no deformity or atrophy Skin: warm and dry, no rash Neuro:  Strength and sensation are intact Psych: euthymic mood, full affect   EKG:    Recent Labs: 02/01/2018: ALT 15 03/14/2018: B Natriuretic Peptide 375.8; BUN 26; Creatinine, Ser 1.20; Hemoglobin 10.2; Magnesium 2.1; Platelets 200; Potassium 4.1; Sodium 142    Lipid Panel    Component Value Date/Time   CHOL 92 11/01/2015 0331   CHOL 139 11/20/2011 0624   TRIG 76 11/01/2015 0331   TRIG 108 11/20/2011 0624   HDL 31 (L) 11/01/2015 0331   HDL 26 (L) 11/20/2011 0624   CHOLHDL 3.0 11/01/2015 0331   VLDL 15 11/01/2015 0331   VLDL 22 11/20/2011 0624   LDLCALC 46 11/01/2015 0331   LDLCALC 91 11/20/2011 0624      Wt Readings from Last 3 Encounters:  03/28/18 227 lb 3.2 oz (103.1 kg)  03/14/18 230 lb 14.4 oz (104.7 kg)  02/28/18 236 lb 6 oz (107.2 kg)      Other studies Reviewed: Echocardiogram  11/25/2015 Left ventricle: LVEF is approxiamtely 45 to 50% with inferior   akinesis; inferoseptal hypokinesis consistent with conduction   delay The cavity size was severely dilated. Wall thickness was   increased in a pattern of mild LVH. The study is not technically   sufficient to allow evaluation of LV diastolic function. - Aortic valve: There was mild regurgitation. - Mitral valve: There was moderate regurgitation. - Left atrium: The atrium was severely dilated. - Right ventricle: The cavity size was severely dilated. Systolic   function was mildly reduced. - Right atrium: The atrium was moderately dilated. - Tricuspid valve: There was mild-moderate regurgitation.    ASSESSMENT AND PLAN:  1. ICM: EF of 30%. He was recently seen in the ER for fluid overload and received IV lasix. He has maintained his weight  since discharge. He did accidentally take an an additional dose of lasix yesterday.   They have many questions about the lasix, his diet and daily weights. This is all explained through deaf interpretor  He will not take any lasix today ( he takes it in the afternoon) and will start back tomorrow at lasix 80 mg daily. He will have a BMET done today to check his kidney function. He will weigh daily and report any weight gain of 3-5 lbs which does not decrease after taking his usual dose.   I will refer him to a dietician for further discussion as the questions are becoming quite lengthy and he would be better served seeing them.   2. ICD in situ: Continue interrogations per protocol   3. CKD: Repeating BMET  4. CAD: Hx of CABG, multiple PCI's BMS to the SVG-OM. He will continue secondary prevention Heart healthy diet.    Current medicines are reviewed at length with the patient today.  I have spend over 45 minutes with this patient and his wife answering multiple questions and explaining his plan.   Labs/ tests ordered today include: BMET  Phill Myron. West Pugh, ANP, AACC    03/28/2018 4:35 PM    [place] [address] Office [phone] Fax [phone]

## 2018-03-28 ENCOUNTER — Encounter: Payer: Self-pay | Admitting: Adult Health

## 2018-03-28 ENCOUNTER — Ambulatory Visit: Payer: PPO | Admitting: Adult Health

## 2018-03-28 VITALS — BP 112/58 | HR 73 | Ht 74.0 in | Wt 227.2 lb

## 2018-03-28 DIAGNOSIS — Z79899 Other long term (current) drug therapy: Secondary | ICD-10-CM

## 2018-03-28 DIAGNOSIS — Z95810 Presence of automatic (implantable) cardiac defibrillator: Secondary | ICD-10-CM

## 2018-03-28 DIAGNOSIS — I2510 Atherosclerotic heart disease of native coronary artery without angina pectoris: Secondary | ICD-10-CM | POA: Diagnosis not present

## 2018-03-28 DIAGNOSIS — I5022 Chronic systolic (congestive) heart failure: Secondary | ICD-10-CM

## 2018-03-28 DIAGNOSIS — I43 Cardiomyopathy in diseases classified elsewhere: Secondary | ICD-10-CM

## 2018-03-28 MED ORDER — HYDROCORTISONE-DIPHENHYDRAMINE 0.5-0.5 % EX LIQD: 1.0000 "application " | Freq: Every day | CUTANEOUS | 11 refills | Status: DC

## 2018-03-28 MED ORDER — FUROSEMIDE 80 MG PO TABS: 80.0000 mg | ORAL_TABLET | Freq: Every day | ORAL | 11 refills | Status: DC

## 2018-03-28 MED ORDER — FUROSEMIDE 40 MG PO TABS: 80.0000 mg | ORAL_TABLET | Freq: Every day | ORAL | 3 refills | Status: DC

## 2018-03-28 NOTE — Patient Instructions (Addendum)
Medication Instructions:  INCREASE LASIX TO 80MG  DAILY APPLY HYDROCORTISONE/DIPENHYDRAMINE TO LEGS DAILY-AS-NEEDED If you need a refill on your cardiac medications before your next appointment, please call your pharmacy.  Labwork: BMET TODAY HERE IN OUR OFFICE AT [facility]   Take the provided lab slips with you to the lab for your blood draw.  When you have your labs (blood work) drawn today and your tests are completely normal, you will receive your results only by MyChart Message (if you have MyChart) -OR-  A paper copy in the mail.  If you have any lab test that is abnormal or we need to change your treatment, we will call you to review these results.  Special Instructions: MAKE SURE TO TALK TO YOUR PRIMARY CARE ABOUT MYRBETRIC   SOMEONE WILL BE CALLING YOU TO DISCUSS DIETARY EXAMPLES  PLEASE GET AND WEAR COMPRESSION HOSE-ON ALL DAY AND OFF AT BEDTIME  Follow-Up: You will need a follow up appointment in 1 months.   You may see Sanda Klein, MD Jory Sims, DNP, AACC  or one of the following Advanced Practice Providers on your designated Care Team:  Almyra Deforest, PA-C  Fabian Sharp, PA-C   At [facility], you and your health needs are our priority.  As part of our continuing mission to provide you with exceptional heart care, we have created designated Provider Care Teams.  These Care Teams include your primary Cardiologist (physician) and Advanced Practice Providers (APPs -  Physician Assistants and Nurse Practitioners) who all work together to provide you with the care you need, when you need it.  Thank you for choosing [facility] at [facility]!!

## 2018-03-29 LAB — BASIC METABOLIC PANEL
BUN / CREAT RATIO: 25 — AB (ref 10–24)
BUN: 33 mg/dL — AB (ref 8–27)
CALCIUM: 9.3 mg/dL (ref 8.6–10.2)
CHLORIDE: 107 mmol/L — AB (ref 96–106)
CO2: 26 mmol/L (ref 20–29)
CREATININE: 1.33 mg/dL — AB (ref 0.76–1.27)
GFR calc Af Amer: 58 mL/min/{1.73_m2} — ABNORMAL LOW (ref >59)
GFR calc non Af Amer: 50 mL/min/{1.73_m2} — ABNORMAL LOW (ref >59)
GLUCOSE: 70 mg/dL (ref 65–99)
Potassium: 4.7 mmol/L (ref 3.5–5.2)
Sodium: 146 mmol/L — ABNORMAL HIGH (ref 134–144)

## 2018-03-30 ENCOUNTER — Telehealth: Payer: Self-pay | Admitting: Cardiovascular Disease

## 2018-03-30 ENCOUNTER — Other Ambulatory Visit: Payer: Self-pay

## 2018-03-30 MED ORDER — HYDROCORTISONE 2 % EX LOTN: 40.0000 [oz_av] | TOPICAL_LOTION | Freq: Two times a day (BID) | CUTANEOUS | 0 refills | Status: DC

## 2018-03-30 NOTE — Telephone Encounter (Signed)
Message forwarded to the ordering provider Jory Sims, DNP and her nurse Sharyn Lull.

## 2018-03-30 NOTE — Telephone Encounter (Signed)
New message   Pt c/o medication issue:  1. Name of Medication: Hydrocortisone-diphenhydrAMINE 0.5-0.5 % LIQD  2. How are you currently taking this medication (dosage and times per day)? No   3. Are you having a reaction (difficulty breathing--STAT)?no   4. What is your medication issue? Thomas [name] states that the pharmacy does not make this  medicine. Will need a substitute or it will have to be called in to another pharmacy. [facility] wants to make sure that is liquid, not a gel or ointment. Please advise.

## 2018-03-30 NOTE — Telephone Encounter (Signed)
SPOKE WITH Thomas [name] AND WILL SEND NEW RX AFTER TALKING WITH KL

## 2018-04-08 NOTE — Progress Notes (Signed)
Patient ID: Thomas [name], male       DOB: 06-13-2034, 84 y.o.      MRN [id number]  Entire visit was done in the presence of the sign language interpreter  HPI  Mr Thomas [name] is a 84 y/o male with a history of CAD,HTN, CKD, PAF, peptic ulcer, bilateral deafness, previous tobacco use and chronic heart failure.   Echo report from 11/25/15 reviewed and showed an EF of 45-50% along with mild AR, moderate MR and mild-moderate TR.   Was in the ED 03/14/18 due to leg swelling after not taking his medication correctly. Diuresed and released. Was in the ED 02/01/18 due to acute on chronic HF. Was given IV lasix and released. Was in the ED 12/30/17 due to neck pain. CT showed no neck abnormalities and patient was released.   He presents today for a follow-up visit with a chief complaint of minimal fatigue upon moderate exertion. He describes this as chronic in nature having been present for several years. He has associated pedal edema along with this. He denies any difficulty sleeping, dizziness, abdominal distention, palpitations, chest pain, shortness of breath or weight gain. He has not gotten support socks yet but has been elevating his legs more.   Past Medical History:  Diagnosis Date  . AICD (automatic cardioverter/defibrillator) present   . Biventricular ICD (implantable cardioverter-defibrillator) in place 03/24/2005   Implantation of a Medtronic Adapta ADDRO1, serial number [id number]  . CHF (congestive heart failure) ([place])   . CKD (chronic kidney disease), stage III ([place])   . Coronary artery disease    a. s/p CABG 1986. b. Multiple PCIs/caths. c. 09/2013: s/p PTCA and BMS to SVG-OM.  [name]   . Dysrhythmia   . History of abdominal aortic aneurysm   . History of bleeding peptic ulcer 1980  . History of epididymitis 2013  . HTN (hypertension)   . Hydronephrosis with ureteropelvic junction obstruction   . Hydroureter on left 2009  . Hypertension   . Ischemic cardiomyopathy    a. Prior EF  30-35%, s/p BIV-ICD. b. 09/2013: EF 45-50%.  . Moderate tricuspid regurgitation   . PAF (paroxysmal atrial fibrillation) ([place])   . Presence of permanent cardiac pacemaker   . Prostate cancer ([place])   . Status post coronary artery bypass grafting 1986   LIMA to the LAD, SVG to OM, SVG to RCA  . Testicular swelling    Past Surgical History:  Procedure Laterality Date  . 2-D echocardiogram  11/20/2011   Ejection fraction 30-35% moderate concentric left ventricular hypertrophy. Left atrium is moderately dilated. Mild MR. Mild or  . BI-VENTRICULAR IMPLANTABLE CARDIOVERTER DEFIBRILLATOR N/A 12/16/2012   Procedure: BI-VENTRICULAR IMPLANTABLE CARDIOVERTER DEFIBRILLATOR  (CRT-D);  Surgeon: [name], MD;  Location: [facility] CATH LAB;  Service: Cardiovascular;  Laterality: N/A;  . CARDIAC CATHETERIZATION  12/10/2011   SVG to OM widely patent.  LIMA to LAD patent  . CATARACT EXTRACTION W/PHACO Right 10/12/2017   Procedure: CATARACT EXTRACTION PHACO AND INTRAOCULAR LENS PLACEMENT (IOC);  Surgeon: Birder Robson, MD;  Location: [facility] ORS;  Service: Ophthalmology;  Laterality: Right;  [place] 00:57 AP% 15.9 CDE 9.07 Fluid pack lot # [id number]  . CORONARY ARTERY BYPASS GRAFT  1986  . INSERT / REPLACE / REMOVE PACEMAKER    . LEFT HEART CATHETERIZATION WITH CORONARY/GRAFT ANGIOGRAM N/A 12/10/2011   Procedure: LEFT HEART CATHETERIZATION WITH Beatrix Fetters;  Surgeon: Sanda Klein, MD;  Location: [facility] CATH LAB;  Service: Cardiovascular;  Laterality: N/A;  . LEFT  HEART CATHETERIZATION WITH CORONARY/GRAFT ANGIOGRAM N/A 09/25/2013   Procedure: LEFT HEART CATHETERIZATION WITH Beatrix Fetters;  Surgeon: Blane Ohara, MD;  Location: [facility] CATH LAB;  Service: Cardiovascular;  Laterality: N/A;  . Persantine Myoview  05/06/2010   Post-rest ejection fraction 30%. No significant ischemia demonstrated. Compared to previous study there is no significant change.  . TRANSURETHRAL RESECTION OF PROSTATE     s/p    Family History  Problem Relation Age of Onset  . Hypertension Father    Social History   Tobacco Use  . Smoking status: Former Smoker    Last attempt to quit: 03/15/1985    Years since quitting: 33.0  . Smokeless tobacco: Never Used  Substance Use Topics  . Alcohol use: No    Comment: occas.   Allergies  Allergen Reactions  . Phenazopyridine Nausea Only and Other (See Comments)    GI UPSET  . Ramipril    Prior to Admission medications   Medication Sig Start Date End Date Taking? Authorizing Provider  albuterol (PROVENTIL HFA;VENTOLIN HFA) 108 (90 Base) MCG/ACT inhaler 2 puffs q.i.d. p.r.n. short of breath, wheezing, or cough 10/30/16  Yes [provider]  carvedilol (COREG) 12.5 MG tablet Take 12.5 mg by mouth 2 (two) times daily with a meal.   Yes [provider]  ELIQUIS 5 MG TABS tablet TAKE 1 TABLET (5 MG TOTAL) BY MOUTH 2 (TWO) TIMES DAILY. 10/05/17  Yes Croitoru, Mihai, MD  furosemide (LASIX) 80 MG tablet Take 1 tablet (80 mg total) by mouth daily. 03/28/18  Yes Lendon Colonel, NP  HYDROCORTISONE, TOPICAL, 2 % LOTN Apply 40 oz topically 2 (two) times daily. APPLY BID TO AFFECTED AREA 03/30/18  Yes Lendon Colonel, NP  isosorbide mononitrate (IMDUR) 60 MG 24 hr tablet TAKE 1.5 TABLETS BY MOUTH DAILY.  01/21/18  Yes Croitoru, Mihai, MD  losartan-hydrochlorothiazide (HYZAAR) 50-12.5 MG tablet TAKE 1 TABLET BY MOUTH EVERY DAY  07/02/16  Yes [name], MD  mirabegron ER (MYRBETRIQ) 50 MG TB24 tablet Take 1 tablet (50 mg total) by mouth daily. 12/13/17  Yes MacDiarmid, Nicki Reaper, MD  potassium chloride (K-DUR) 10 MEQ tablet Take 3 tablets (30 mEq total) by mouth daily. Patient taking differently: Take 20 mEq by mouth daily.  11/30/16  Yes Croitoru, Mihai, MD  simvastatin (ZOCOR) 20 MG tablet TAKE 1 TABLET BY MOUTH DAILY 05/31/17  Yes Croitoru, Mihai, MD  vitamin B-12 (CYANOCOBALAMIN) 500 MCG tablet Take 500 mcg by mouth daily.   Yes [provider]     Review of Systems  Constitutional: Positive for fatigue. Negative for appetite change.  HENT: Positive for hearing loss. Negative for congestion, rhinorrhea and sore throat.   Eyes: Negative.   Respiratory: Negative for chest tightness and shortness of breath.   Cardiovascular: Positive for leg swelling. Negative for chest pain and palpitations.  Gastrointestinal: Negative for abdominal distention and abdominal pain.  Endocrine: Negative.   Genitourinary: Negative.   Musculoskeletal: Positive for arthralgias (sometimes in legs). Negative for back pain.  Skin: Negative.   Allergic/Immunologic: Negative.   Neurological: Negative for dizziness and light-headedness.  Hematological: Negative for adenopathy. Bruises/bleeds easily.  Psychiatric/Behavioral: Negative for dysphoric mood and sleep disturbance (sleeping on 2 pillows). The patient is not nervous/anxious.    Vitals:   04/11/18 0914  BP: (!) 109/57  Pulse: 84  Resp: 18  SpO2: 95%  Weight: 233 lb 6 oz (105.9 kg)  Height: 6\' 2"  (1.88 m)   Wt Readings from Last 3  Encounters:  04/11/18 233 lb 6 oz (105.9 kg)  03/28/18 227 lb 3.2 oz (103.1 kg)  03/14/18 230 lb 14.4 oz (104.7 kg)   Lab Results  Component Value Date   CREATININE 1.33 (H) 03/28/2018   CREATININE 1.20 03/14/2018   CREATININE 1.30 (H) 03/14/2018    Physical Exam Vitals signs and nursing note reviewed.  Constitutional:      Appearance: He is well-developed.  HENT:     Head: Normocephalic and atraumatic.     Ears:     Comments: Completely deaf in both ears Cardiovascular:     Rate and Rhythm: Normal rate and regular rhythm.  Pulmonary:     Effort: Pulmonary effort is normal.     Breath sounds: Normal breath sounds. No rhonchi or rales.  Abdominal:     Palpations: Abdomen is soft.     Tenderness: There is no abdominal tenderness.  Musculoskeletal:     Right lower leg: He exhibits no tenderness. Edema (2+ pitting) present.     Left lower leg: He  exhibits no tenderness. Edema (2+ pitting) present.  Skin:    General: Skin is warm and dry.  Neurological:     Mental Status: He is alert and oriented to person, place, and time.     Motor: No weakness.  Psychiatric:        Mood and Affect: Mood normal.        Behavior: Behavior normal.    Assessment & Plan:  1: Chronic heart failure with mildly reduced ejection fraction- - NYHA class II - euvolemic  - weighing daily and he was reminded to call for an overnight weight gain of >2 pounds or a weekly weight gain of >5 pounds - weight down 3 pounds from last visit here 6 weeks ago - not adding salt and has been reading food labels. Reviewed the importance of closely following a 2000mg  sodium diet  - drinks 1 cup of coffee and ~ 32 ounces of water daily - last echo done 11/2015 so this was scheduled for 05/02/2018 - remains active and still works Tues-Friday - saw cardiology Purcell Nails) 03/28/2018 - has ICD - EF >40% so would not qualify for entresto - BNP 03/14/18 was 375.8 - says that he hasn't received his flu vaccine yet  2: HTN- - BP looks good although on the low side - saw PCP Kary Kos) 10/13/16 - BMP from 03/28/2018 reviewed and showed sodium 146, potassium 4.7, creatinine 1.33 and GFR 50  3: Lymphedema- - stage 2 - has been elevating his legs more often during the day when he's off work and then when he gets home in the afternoons - addresses given for local medical supply stores so that they can call regarding pricing of support socks - limited in his ability to exercise due to his shortness of breath - can consider lymphapress compression boots if edema persists after he gets the support socks.  Patient did not bring his medications nor a list. Each medication was verbally reviewed with the patient and he was encouraged to bring the bottles to every visit to confirm accuracy of list.  Return in 3 months or sooner for any questions/problems before then.    Entire visit  was done in the presence of the sign language interpreter

## 2018-04-11 ENCOUNTER — Ambulatory Visit: Payer: PPO | Attending: Family | Admitting: Family

## 2018-04-11 ENCOUNTER — Encounter: Payer: Self-pay | Admitting: Pharmacist

## 2018-04-11 ENCOUNTER — Encounter: Payer: Self-pay | Admitting: Family

## 2018-04-11 ENCOUNTER — Other Ambulatory Visit: Payer: Self-pay | Admitting: Cardiovascular Disease

## 2018-04-11 VITALS — BP 109/57 | HR 84 | Resp 18 | Ht 74.0 in | Wt 233.4 lb

## 2018-04-11 DIAGNOSIS — I255 Ischemic cardiomyopathy: Secondary | ICD-10-CM | POA: Insufficient documentation

## 2018-04-11 DIAGNOSIS — Z7901 Long term (current) use of anticoagulants: Secondary | ICD-10-CM | POA: Diagnosis not present

## 2018-04-11 DIAGNOSIS — I509 Heart failure, unspecified: Secondary | ICD-10-CM | POA: Insufficient documentation

## 2018-04-11 DIAGNOSIS — I10 Essential (primary) hypertension: Secondary | ICD-10-CM

## 2018-04-11 DIAGNOSIS — I890 Lymphedema, not elsewhere classified: Secondary | ICD-10-CM

## 2018-04-11 DIAGNOSIS — I130 Hypertensive heart and chronic kidney disease with heart failure and stage 1 through stage 4 chronic kidney disease, or unspecified chronic kidney disease: Secondary | ICD-10-CM | POA: Insufficient documentation

## 2018-04-11 DIAGNOSIS — Z95810 Presence of automatic (implantable) cardiac defibrillator: Secondary | ICD-10-CM | POA: Diagnosis not present

## 2018-04-11 DIAGNOSIS — N183 Chronic kidney disease, stage 3 (moderate): Secondary | ICD-10-CM | POA: Diagnosis not present

## 2018-04-11 DIAGNOSIS — Z87891 Personal history of nicotine dependence: Secondary | ICD-10-CM | POA: Diagnosis not present

## 2018-04-11 DIAGNOSIS — I2510 Atherosclerotic heart disease of native coronary artery without angina pectoris: Secondary | ICD-10-CM | POA: Diagnosis not present

## 2018-04-11 DIAGNOSIS — Z8546 Personal history of malignant neoplasm of prostate: Secondary | ICD-10-CM | POA: Insufficient documentation

## 2018-04-11 DIAGNOSIS — Z79899 Other long term (current) drug therapy: Secondary | ICD-10-CM | POA: Diagnosis not present

## 2018-04-11 DIAGNOSIS — I480 Paroxysmal atrial fibrillation: Secondary | ICD-10-CM | POA: Insufficient documentation

## 2018-04-11 DIAGNOSIS — Z888 Allergy status to other drugs, medicaments and biological substances status: Secondary | ICD-10-CM | POA: Diagnosis not present

## 2018-04-11 DIAGNOSIS — Z951 Presence of aortocoronary bypass graft: Secondary | ICD-10-CM | POA: Diagnosis not present

## 2018-04-11 DIAGNOSIS — Z8249 Family history of ischemic heart disease and other diseases of the circulatory system: Secondary | ICD-10-CM | POA: Diagnosis not present

## 2018-04-11 DIAGNOSIS — I5022 Chronic systolic (congestive) heart failure: Secondary | ICD-10-CM

## 2018-04-11 NOTE — Progress Notes (Signed)
Patient declined pharmacist visit.

## 2018-04-11 NOTE — Patient Instructions (Signed)
Continue weighing daily and call for an overnight weight gain of > 2 pounds or a weekly weight gain of >5 pounds. 

## 2018-04-19 ENCOUNTER — Ambulatory Visit (INDEPENDENT_AMBULATORY_CARE_PROVIDER_SITE_OTHER): Payer: 59

## 2018-04-19 DIAGNOSIS — I5022 Chronic systolic (congestive) heart failure: Secondary | ICD-10-CM

## 2018-04-19 DIAGNOSIS — I255 Ischemic cardiomyopathy: Secondary | ICD-10-CM

## 2018-04-22 ENCOUNTER — Telehealth: Payer: Self-pay | Admitting: Cardiovascular Disease

## 2018-04-22 ENCOUNTER — Telehealth: Payer: Self-pay

## 2018-04-22 LAB — CUP PACEART REMOTE DEVICE CHECK
Brady Statistic AS VP Percent: 99.29 %
Brady Statistic AS VS Percent: 0.71 %
Brady Statistic RA Percent Paced: 0 %
Date Time Interrogation Session: 20200204111806
HIGH POWER IMPEDANCE MEASURED VALUE: 61 Ohm
Implantable Lead Implant Date: 20000210
Implantable Lead Location: 753859
Implantable Lead Location: 753860
Implantable Lead Model: 4244
Implantable Lead Serial Number: 413633
Implantable Pulse Generator Implant Date: 20141003
Lead Channel Impedance Value: 266 Ohm
Lead Channel Impedance Value: 342 Ohm
Lead Channel Impedance Value: 342 Ohm
Lead Channel Impedance Value: 399 Ohm
Lead Channel Impedance Value: 551 Ohm
Lead Channel Impedance Value: 570 Ohm
Lead Channel Impedance Value: 570 Ohm
Lead Channel Impedance Value: 570 Ohm
Lead Channel Impedance Value: 570 Ohm
Lead Channel Pacing Threshold Amplitude: 0.75 V
Lead Channel Pacing Threshold Amplitude: 1.375 V
Lead Channel Pacing Threshold Pulse Width: 0.4 ms
Lead Channel Pacing Threshold Pulse Width: 0.6 ms
Lead Channel Sensing Intrinsic Amplitude: 0.875 mV
Lead Channel Sensing Intrinsic Amplitude: 0.875 mV
Lead Channel Sensing Intrinsic Amplitude: 4.75 mV
Lead Channel Setting Pacing Pulse Width: 0.4 ms
Lead Channel Setting Pacing Pulse Width: 0.6 ms
Lead Channel Setting Sensing Sensitivity: 0.3 mV
MDC IDC LEAD IMPLANT DT: 20000210
MDC IDC LEAD IMPLANT DT: 20141003
MDC IDC LEAD LOCATION: 753858
MDC IDC LEAD SERIAL: 272469
MDC IDC MSMT BATTERY REMAINING LONGEVITY: 16 mo
MDC IDC MSMT BATTERY VOLTAGE: 2.9 V
MDC IDC MSMT LEADCHNL LV IMPEDANCE VALUE: 323 Ohm
MDC IDC MSMT LEADCHNL LV IMPEDANCE VALUE: 342 Ohm
MDC IDC MSMT LEADCHNL LV IMPEDANCE VALUE: 437 Ohm
MDC IDC MSMT LEADCHNL RV IMPEDANCE VALUE: 285 Ohm
MDC IDC MSMT LEADCHNL RV SENSING INTR AMPL: 4.75 mV
MDC IDC SET LEADCHNL LV PACING AMPLITUDE: 2.5 V
MDC IDC SET LEADCHNL RV PACING AMPLITUDE: 2 V
MDC IDC STAT BRADY AP VP PERCENT: 0 %
MDC IDC STAT BRADY AP VS PERCENT: 0 %
MDC IDC STAT BRADY RV PERCENT PACED: 98.66 %

## 2018-04-22 NOTE — Telephone Encounter (Signed)
Called pt to follow up on coaching referral. Left vm

## 2018-04-22 NOTE — Telephone Encounter (Signed)
New Message ° ° °Patient returning your phone call. °

## 2018-04-23 NOTE — Progress Notes (Signed)
Cardiology Office Note   Date:  04/25/2018   ID:  [name], [name] July 01, 1934, MRN [id number]  PCP:  [name], MD  Cardiologist:  Dr.Taylor  Chief Complaint  Patient presents with  . Congestive Heart Failure  . Cardiomyopathy  . Coronary Artery Disease     History of Present Illness: Thomas [name] is a 84 y.o. male who presents for ongoing assessment and management of chronic systolic CHF, EF of 06%, and ICM, Hx of CABG, multiple PCI's BMS to the SVG-OM. He will continue secondary prevention Heart healthy diet.. He has an ICD (BiV ICD) placed in 2015. He was last seen in the office 03/28/2018 after hospitalization for decompensated CHF in 02/2018  Echo revealed EF of 30% .He is deaf and there are challenges with communication.   He is here for close follow up concerning his volume status. He was increased on his dose of lasix to 80 mg. He was advised to have support hose for lymphedema.   He comes today with his wife and an interpretor due to his deafness. He has gained about 6 lbs He has not yet bought the support hose. He is also complaining of his skin in thel lower extremities itching. He has scratched open some wounds. He has some dermatitis that is noted in the distal portion of the legs, more on the right than the left. He has not take his diuretic today.   He thinks that he is due for a PPM replacement. He reports that he has had it 10 years and was supposed to talk to Dr.Taylor about this on upcoming appointment.   Past Medical History:  Diagnosis Date  . AICD (automatic cardioverter/defibrillator) present   . Biventricular ICD (implantable cardioverter-defibrillator) in place 03/24/2005   Implantation of a Medtronic Adapta ADDRO1, serial number [id number]  . CHF (congestive heart failure) ([place])   . CKD (chronic kidney disease), stage III ([place])   . Coronary artery disease    a. s/p CABG 1986. b. Multiple PCIs/caths. c. 09/2013: s/p PTCA and BMS to SVG-OM.  [name]   .  Dysrhythmia   . History of abdominal aortic aneurysm   . History of bleeding peptic ulcer 1980  . History of epididymitis 2013  . HTN (hypertension)   . Hydronephrosis with ureteropelvic junction obstruction   . Hydroureter on left 2009  . Hypertension   . Ischemic cardiomyopathy    a. Prior EF 30-35%, s/p BIV-ICD. b. 09/2013: EF 45-50%.  . Moderate tricuspid regurgitation   . PAF (paroxysmal atrial fibrillation) ([place])   . Presence of permanent cardiac pacemaker   . Prostate cancer ([place])   . Status post coronary artery bypass grafting 1986   LIMA to the LAD, SVG to OM, SVG to RCA  . Testicular swelling     Past Surgical History:  Procedure Laterality Date  . 2-D echocardiogram  11/20/2011   Ejection fraction 30-35% moderate concentric left ventricular hypertrophy. Left atrium is moderately dilated. Mild MR. Mild or  . BI-VENTRICULAR IMPLANTABLE CARDIOVERTER DEFIBRILLATOR N/A 12/16/2012   Procedure: BI-VENTRICULAR IMPLANTABLE CARDIOVERTER DEFIBRILLATOR  (CRT-D);  Surgeon: [name], MD;  Location: [facility] CATH LAB;  Service: Cardiovascular;  Laterality: N/A;  . CARDIAC CATHETERIZATION  12/10/2011   SVG to OM widely patent.  LIMA to LAD patent  . CATARACT EXTRACTION W/PHACO Right 10/12/2017   Procedure: CATARACT EXTRACTION PHACO AND INTRAOCULAR LENS PLACEMENT (IOC);  Surgeon: Birder Robson, MD;  Location: [facility] ORS;  Service: Ophthalmology;  Laterality: Right;  [place] 00:57 AP% 15.9 CDE 9.07 Fluid pack lot # [id number]  . CORONARY ARTERY BYPASS GRAFT  1986  . INSERT / REPLACE / REMOVE PACEMAKER    . LEFT HEART CATHETERIZATION WITH CORONARY/GRAFT ANGIOGRAM N/A 12/10/2011   Procedure: LEFT HEART CATHETERIZATION WITH Beatrix Fetters;  Surgeon: Sanda Klein, MD;  Location: [place] CATH LAB;  Service: Cardiovascular;  Laterality: N/A;  . LEFT HEART CATHETERIZATION WITH CORONARY/GRAFT ANGIOGRAM N/A 09/25/2013   Procedure: LEFT HEART CATHETERIZATION WITH Beatrix Fetters;  Surgeon:  Blane Ohara, MD;  Location: [facility] CATH LAB;  Service: Cardiovascular;  Laterality: N/A;  . Persantine Myoview  05/06/2010   Post-rest ejection fraction 30%. No significant ischemia demonstrated. Compared to previous study there is no significant change.  . TRANSURETHRAL RESECTION OF PROSTATE     s/p       Allergies:   Phenazopyridine and Ramipril    Social History:  The patient  reports that he quit smoking about 33 years ago. He has never used smokeless tobacco. He reports that he does not drink alcohol or use drugs.   Family History:  The patient's family history includes Hypertension in his father.    ROS: All other systems are reviewed and negative. Unless otherwise mentioned in H&P    PHYSICAL EXAM: VS:  BP 110/62   Pulse 91   Ht 6\' 2"  (1.88 m)   Wt 242 lb (109.8 kg)   BMI 31.07 kg/m  , BMI Body mass index is 31.07 kg/m. GEN: Well nourished, well developed, in no acute distress HEENT: normal Neck: no JVD, carotid bruits, or masses Cardiac: IRRR; no murmurs, rubs, or gallops,dependent  edema  Respiratory:  Crackles in the bases GI: soft, nontender, nondistended, + BS MS: no deformity or atrophy Skin: warm and dry, no rash, open sores and areas of skin break and erythema.  Neuro:  Strength and sensation are intact Psych: euthymic mood, full affect   EKG: Not completed this office visit.   Recent Labs: 02/01/2018: ALT 15 03/14/2018: B Natriuretic Peptide 375.8; Hemoglobin 10.2; Magnesium 2.1; Platelets 200 03/28/2018: BUN 33; Creatinine, Ser 1.33; Potassium 4.7; Sodium 146    Lipid Panel    Component Value Date/Time   CHOL 92 11/01/2015 0331   CHOL 139 11/20/2011 0624   TRIG 76 11/01/2015 0331   TRIG 108 11/20/2011 0624   HDL 31 (L) 11/01/2015 0331   HDL 26 (L) 11/20/2011 0624   CHOLHDL 3.0 11/01/2015 0331   VLDL 15 11/01/2015 0331   VLDL 22 11/20/2011 0624   LDLCALC 46 11/01/2015 0331   LDLCALC 91 11/20/2011 0624      Wt Readings from Last 3  Encounters:  04/25/18 242 lb (109.8 kg)  04/11/18 233 lb 6 oz (105.9 kg)  03/28/18 227 lb 3.2 oz (103.1 kg)      Other studies Reviewed: Echocardiogram 12/23/15 Left ventricle: LVEF is approxiamtely 45 to 50% with inferior akinesis; inferoseptal hypokinesis consistent with conduction delay The cavity size was severely dilated. Wall thickness was increased in a pattern of mild LVH. The study is not technically sufficient to allow evaluation of LV diastolic function. - Aortic valve: There was mild regurgitation. - Mitral valve: There was moderate regurgitation. - Left atrium: The atrium was severely dilated. - Right ventricle: The cavity size was severely dilated. Systolic function was mildly reduced. - Right atrium: The atrium was moderately dilated. - Tricuspid valve: There was mild-moderate regurgitation.   ASSESSMENT AND PLAN:  1. Chronic LEE: He has 2+ dependent edema with what  looks like early cellulitis caused by his scratching.  I will give him a Rx for Keflex 500 mg BID and hydrocortisone cream with anti-itch properties.   2. CAD: Hx of CABG: No complaints of chest pain or increased fatigue.   3. ICM: Last EF was 40%,. He is due to have repeat echo on 05/02/2018. May need medication adjustments if EF is worsened.   4. PPM In situ: Will see Dr. [name] in April at which time they will discuss at that time. Last interrogation on 04/22/2018 reported 16 months battery life.   Current medicines are reviewed at length with the patient today.    Labs/ tests ordered today include: None  Phill Myron. West Pugh, ANP, AACC   04/25/2018 3:09 PM    [facility] [address] Office [phone] Fax [phone]

## 2018-04-25 ENCOUNTER — Ambulatory Visit (INDEPENDENT_AMBULATORY_CARE_PROVIDER_SITE_OTHER): Payer: 59 | Admitting: Adult Health

## 2018-04-25 ENCOUNTER — Encounter: Payer: Self-pay | Admitting: Adult Health

## 2018-04-25 VITALS — BP 110/62 | HR 91 | Ht 74.0 in | Wt 242.0 lb

## 2018-04-25 DIAGNOSIS — I2510 Atherosclerotic heart disease of native coronary artery without angina pectoris: Secondary | ICD-10-CM | POA: Diagnosis not present

## 2018-04-25 DIAGNOSIS — I255 Ischemic cardiomyopathy: Secondary | ICD-10-CM

## 2018-04-25 DIAGNOSIS — I5022 Chronic systolic (congestive) heart failure: Secondary | ICD-10-CM

## 2018-04-25 MED ORDER — CEPHALEXIN 500 MG PO CAPS: 500.0000 mg | ORAL_CAPSULE | Freq: Two times a day (BID) | ORAL | 0 refills | Status: DC

## 2018-04-25 MED ORDER — HYDROCORTISONE 2 % EX LOTN: 40.0000 [oz_av] | TOPICAL_LOTION | Freq: Two times a day (BID) | CUTANEOUS | 0 refills | Status: DC

## 2018-04-25 NOTE — Patient Instructions (Signed)
Medication Instructions:  TAKE KEFLEX 500MG  TWICE DAILY FOR 7 DAYS If you need a refill on your cardiac medications before your next appointment, please call your pharmacy.  Labwork: When you have your labs (blood work) drawn today and your tests are completely normal, you will receive your results only by MyChart Message (if you have MyChart) -OR-  A paper copy in the mail.  If you have any lab test that is abnormal or we need to change your treatment, we will call you to review these results.  Special Instructions: TAKE AND LOG YOUR WEIGHT DAILY AND BRING LOG WITH YOU TO YOUR APPOINTMENTS  PLEASE FOLLOW LOW SALT DIET  Follow-Up: MAKE SURE TO KEEP SCHEDULED APPOINTMENT WITH DR Knox Saliva will need a follow up appointment in June WITH Sanda Klein, MD Please call our office 2 months  (April 2020) in advance to schedule this  (June 2020) appointment or one of the following Advanced Practice Providers on your designated Care Team:  Almyra Deforest, PA-C [place], [place]. At [facility], you and your health needs are our priority.  As part of our continuing mission to provide you with exceptional heart care, we have created designated Provider Care Teams.  These Care Teams include your primary Cardiologist (physician) and Advanced Practice Providers (APPs -  Physician Assistants and Nurse Practitioners) who all work together to provide you with the care you need, when you need it.  Thank you for choosing [facility] at [facility]!!

## 2018-04-26 NOTE — Progress Notes (Signed)
Thomas [name], His usual weight is around 230 lb +/- a few pounds. He seems to be about 10 lb above "dry weight". I would recommend increasing his loop diuretic dose until he has lost 12 lb. Give him furosemide 80 mg BID and an extra 20 mEq daily of KCl until he reaches that , then go back to once daily dosing. BMET next week. Thanks, [employer]

## 2018-04-27 ENCOUNTER — Other Ambulatory Visit: Payer: Self-pay | Admitting: Cardiovascular Disease

## 2018-04-28 NOTE — Progress Notes (Signed)
Remote ICD transmission.   

## 2018-05-02 ENCOUNTER — Ambulatory Visit
Admission: RE | Admit: 2018-05-02 | Discharge: 2018-05-02 | Disposition: A | Discharge status: Home / Self Care | Payer: PPO | Source: Ambulatory Visit | Attending: Family | Admitting: Family

## 2018-05-02 DIAGNOSIS — Z95810 Presence of automatic (implantable) cardiac defibrillator: Secondary | ICD-10-CM | POA: Insufficient documentation

## 2018-05-02 DIAGNOSIS — I255 Ischemic cardiomyopathy: Secondary | ICD-10-CM | POA: Diagnosis not present

## 2018-05-02 DIAGNOSIS — I5022 Chronic systolic (congestive) heart failure: Secondary | ICD-10-CM | POA: Diagnosis not present

## 2018-05-02 DIAGNOSIS — I130 Hypertensive heart and chronic kidney disease with heart failure and stage 1 through stage 4 chronic kidney disease, or unspecified chronic kidney disease: Secondary | ICD-10-CM | POA: Insufficient documentation

## 2018-05-02 DIAGNOSIS — Z951 Presence of aortocoronary bypass graft: Secondary | ICD-10-CM | POA: Diagnosis not present

## 2018-05-02 DIAGNOSIS — I083 Combined rheumatic disorders of mitral, aortic and tricuspid valves: Secondary | ICD-10-CM | POA: Insufficient documentation

## 2018-05-02 DIAGNOSIS — I480 Paroxysmal atrial fibrillation: Secondary | ICD-10-CM | POA: Insufficient documentation

## 2018-05-02 DIAGNOSIS — I272 Pulmonary hypertension, unspecified: Secondary | ICD-10-CM | POA: Insufficient documentation

## 2018-05-02 DIAGNOSIS — N189 Chronic kidney disease, unspecified: Secondary | ICD-10-CM | POA: Insufficient documentation

## 2018-05-02 NOTE — Progress Notes (Signed)
*  PRELIMINARY RESULTS* Echocardiogram 2D Echocardiogram has been performed.  Thomas [name] 05/02/2018, 10:39 AM

## 2018-05-09 DIAGNOSIS — Z961 Presence of intraocular lens: Secondary | ICD-10-CM | POA: Diagnosis not present

## 2018-05-18 DIAGNOSIS — B9689 Other specified bacterial agents as the cause of diseases classified elsewhere: Secondary | ICD-10-CM | POA: Diagnosis not present

## 2018-05-18 DIAGNOSIS — J019 Acute sinusitis, unspecified: Secondary | ICD-10-CM | POA: Diagnosis not present

## 2018-05-18 DIAGNOSIS — R05 Cough: Secondary | ICD-10-CM | POA: Diagnosis not present

## 2018-05-19 ENCOUNTER — Emergency Department
Admission: EM | Admit: 2018-05-19 | Discharge: 2018-05-19 | Disposition: A | Discharge status: Home / Self Care | Payer: PPO | Attending: Emergency Medicine | Admitting: Emergency Medicine

## 2018-05-19 ENCOUNTER — Telehealth: Payer: Self-pay | Admitting: Adult Health

## 2018-05-19 ENCOUNTER — Encounter: Payer: Self-pay | Admitting: Emergency Medicine

## 2018-05-19 ENCOUNTER — Emergency Department: Payer: PPO

## 2018-05-19 ENCOUNTER — Other Ambulatory Visit: Payer: Self-pay

## 2018-05-19 DIAGNOSIS — R270 Ataxia, unspecified: Secondary | ICD-10-CM | POA: Insufficient documentation

## 2018-05-19 DIAGNOSIS — R55 Syncope and collapse: Secondary | ICD-10-CM | POA: Insufficient documentation

## 2018-05-19 DIAGNOSIS — Z7901 Long term (current) use of anticoagulants: Secondary | ICD-10-CM | POA: Diagnosis not present

## 2018-05-19 DIAGNOSIS — Z5321 Procedure and treatment not carried out due to patient leaving prior to being seen by health care provider: Secondary | ICD-10-CM | POA: Insufficient documentation

## 2018-05-19 DIAGNOSIS — S0990XA Unspecified injury of head, initial encounter: Secondary | ICD-10-CM | POA: Diagnosis not present

## 2018-05-19 NOTE — Telephone Encounter (Signed)
Spoke with pts wife Thomas [name] again and she reports that he did fall yesterday because he was unsteady on his feet and he hit his head and was unconscious for several minutes and was very difficult arousing him. He does not have any open wounds but he has bruising and was lethargic the rest of the day. She says that he has been getting weaker and she is worried that he is declining... I advised her to take him to the ER to have his head assessed but she declined.. I strongly urged her to take him and to talk with his PCP about getting in for an evaluation ( Thomas [name]) and she agreed.. I also made him an appt per her request to come in next week and see Thomas Sims NP for 05/25/18 at 2pm.

## 2018-05-19 NOTE — Telephone Encounter (Signed)
New Message   Pt c/o Syncope: STAT if syncope occurred within 30 minutes and pt complains of lightheadedness High Priority if episode of passing out, completely, today or in last 24 hours   1. Did you pass out today? Yesterday  after lunch between 11am and 1pm  2. When is the last time you passed out? Yesterday   3. Has this occurred multiple times? Had the same issue back in December of 2019  4. Did you have any symptoms prior to passing out? Looses balance, weakness, and dizziness.

## 2018-05-19 NOTE — Telephone Encounter (Signed)
Called pts wife back, Thomas [name], and through signs video relay service...Marland KitchenMarland Kitchen pt is still having increased discomfort of his lower extremities.[name] they are asking for more Hydrocortisone.. I asked about the message that I received about him falling and they replied there was "no fall".... I asked about his legs and the communication was not good.. I asked if he had any appts soon with his PCP, Thomas [name] but they put me on hold for several minutes and then said "Thank you" and hung up..  I called back and through Signs Video Relay I had to leave a message.

## 2018-05-19 NOTE — Telephone Encounter (Signed)
New Message    *STAT* If patient is at the pharmacy, call can be transferred to refill team.   1. Which medications need to be refilled? (please list name of each medication and dose if known) cephALEXin (KEFLEX) 500 MG capsule   2. Which pharmacy/location (including street and city if local pharmacy) is medication to be sent to? [id number] [address], [place]  3. Do they need a 30 day or 90 day supply? [place]

## 2018-05-19 NOTE — ED Triage Notes (Addendum)
Triage via [place] interpreter. Pt arrives with wife after fall yesterday per family. Family reports positive loc. Pt went to PCP prior to arrival to ED; PCP sent pt to ED for Ct scan. No neurological deficits

## 2018-05-24 NOTE — Progress Notes (Addendum)
Cardiology Office Note   Date:  05/25/2018   ID:  [name], Vane January 02, 1935, MRN [id number]  PCP:  [name], MD  Cardiologist: Dr. [name]  Chief Complaint  Patient presents with  . Shortness of Breath  . Coronary Artery Disease  . Cardiomyopathy  . Congestive Heart Failure     History of Present Illness: Thomas [name] is a 84 y.o. male who presents for ongoing assessment and management of chronic systolic CHF, EF of 79%, and ICM, Hx of CABG, multiple PCI's BMS to the SVG-OM. He will continue secondary prevention Heart healthy diet.. He has an ICD (BiV ICD) placed in 2015. He was last seen in the office 03/28/2018 after hospitalization for decompensated CHF in 02/2018  Echo revealed EF of 30% .He is deaf and there are challenges with communication. His wife is deaf as well. Information is assisted by deaf sign interpretors.   On last visit he continued to have LEE and what appeared to be early cellulitis. He was started on Keflex 500 mg BID, and hydrocortisone cream topically for sores which were causing him to itch and scratch. He had healed from the cellulitis but continues to scratch at his legs.   He apparently had an episode of "blacking out" and hitting his head on 05/19/2018. He was sent to ER, but did not stay after his CT of the head. He was found to be  Neurologically at baseline. He has been tired and not feeling well. He was advised to follow up with PCP but has not done so.   He comes today with worsening coughing congestion, and LEE. He is having some orthopnea. He is feeling worse from last office visit. He is on higher doses of lasix and has lost some weight but due to his symptoms he may need to be admitted.   Past Medical History:  Diagnosis Date  . AICD (automatic cardioverter/defibrillator) present   . Biventricular ICD (implantable cardioverter-defibrillator) in place 03/24/2005   Implantation of a Medtronic Adapta ADDRO1, serial number [id number]  . CHF  (congestive heart failure) ([place])   . CKD (chronic kidney disease), stage III ([place])   . Coronary artery disease    a. s/p CABG 1986. b. Multiple PCIs/caths. c. 09/2013: s/p PTCA and BMS to SVG-OM.  [name]   . Dysrhythmia   . History of abdominal aortic aneurysm   . History of bleeding peptic ulcer 1980  . History of epididymitis 2013  . HTN (hypertension)   . Hydronephrosis with ureteropelvic junction obstruction   . Hydroureter on left 2009  . Hypertension   . Ischemic cardiomyopathy    a. Prior EF 30-35%, s/p BIV-ICD. b. 09/2013: EF 45-50%.  . Moderate tricuspid regurgitation   . PAF (paroxysmal atrial fibrillation) ([place])   . Presence of permanent cardiac pacemaker   . Prostate cancer ([place])   . Status post coronary artery bypass grafting 1986   LIMA to the LAD, SVG to OM, SVG to RCA  . Testicular swelling     Past Surgical History:  Procedure Laterality Date  . 2-D echocardiogram  11/20/2011   Ejection fraction 30-35% moderate concentric left ventricular hypertrophy. Left atrium is moderately dilated. Mild MR. Mild or  . BI-VENTRICULAR IMPLANTABLE CARDIOVERTER DEFIBRILLATOR N/A 12/16/2012   Procedure: BI-VENTRICULAR IMPLANTABLE CARDIOVERTER DEFIBRILLATOR  (CRT-D);  Surgeon: [name], MD;  Location: [facility] CATH LAB;  Service: Cardiovascular;  Laterality: N/A;  . CARDIAC CATHETERIZATION  12/10/2011   SVG to OM widely patent.  LIMA  to LAD patent  . CATARACT EXTRACTION W/PHACO Right 10/12/2017   Procedure: CATARACT EXTRACTION PHACO AND INTRAOCULAR LENS PLACEMENT (IOC);  Surgeon: Birder Robson, MD;  Location: [facility] ORS;  Service: Ophthalmology;  Laterality: Right;  [place] 00:57 AP% 15.9 CDE 9.07 Fluid pack lot # [id number]  . CORONARY ARTERY BYPASS GRAFT  1986  . INSERT / REPLACE / REMOVE PACEMAKER    . LEFT HEART CATHETERIZATION WITH CORONARY/GRAFT ANGIOGRAM N/A 12/10/2011   Procedure: LEFT HEART CATHETERIZATION WITH Beatrix Fetters;  Surgeon: Sanda Klein, MD;  Location: [place]  CATH LAB;  Service: Cardiovascular;  Laterality: N/A;  . LEFT HEART CATHETERIZATION WITH CORONARY/GRAFT ANGIOGRAM N/A 09/25/2013   Procedure: LEFT HEART CATHETERIZATION WITH Beatrix Fetters;  Surgeon: Blane Ohara, MD;  Location: [facility] CATH LAB;  Service: Cardiovascular;  Laterality: N/A;  . Persantine Myoview  05/06/2010   Post-rest ejection fraction 30%. No significant ischemia demonstrated. Compared to previous study there is no significant change.  . TRANSURETHRAL RESECTION OF PROSTATE     s/p     Current Outpatient Medications  Medication Sig Dispense Refill  . albuterol (PROVENTIL HFA;VENTOLIN HFA) 108 (90 Base) MCG/ACT inhaler 2 puffs q.i.d. p.r.n. short of breath, wheezing, or cough    . carvedilol (COREG) 12.5 MG tablet Take 12.5 mg by mouth 2 (two) times daily with a meal.    . cephALEXin (KEFLEX) 500 MG capsule Take 1 capsule (500 mg total) by mouth 2 (two) times daily. 14 capsule 0  . ELIQUIS 5 MG TABS tablet TAKE 1 TABLET (5 MG TOTAL) BY MOUTH 2 (TWO) TIMES DAILY. 180 tablet 1  . furosemide (LASIX) 80 MG tablet Take 1 tablet (80 mg total) by mouth daily. 30 tablet 11  . HYDROCORTISONE, TOPICAL, 2 % LOTN Apply 40 oz topically 2 (two) times daily. APPLY BID TO AFFECTED AREA 1 Bottle 0  . isosorbide mononitrate (IMDUR) 60 MG 24 hr tablet Take 1.5 tablets by mouth daily 135 tablet 0  . losartan (COZAAR) 25 MG tablet     . losartan-hydrochlorothiazide (HYZAAR) 50-12.5 MG tablet TAKE 1 TABLET BY MOUTH EVERY DAY **NEED OFFICE VISIT** 30 tablet 9  . mirabegron ER (MYRBETRIQ) 50 MG TB24 tablet Take 1 tablet (50 mg total) by mouth daily. 30 tablet 11  . potassium chloride (K-DUR) 10 MEQ tablet Take 3 tablets (30 mEq total) by mouth daily. (Patient taking differently: Take 20 mEq by mouth daily. ) 270 tablet 3  . simvastatin (ZOCOR) 20 MG tablet TAKE 1 TABLET BY MOUTH DAILY 90 tablet 2  . vitamin B-12 (CYANOCOBALAMIN) 500 MCG tablet Take 500 mcg by mouth daily.     No current  facility-administered medications for this visit.     Allergies:   Phenazopyridine and Ramipril    Social History:  The patient  reports that he quit smoking about 33 years ago. He has never used smokeless tobacco. He reports that he does not drink alcohol or use drugs.   Family History:  The patient's family history includes Hypertension in his father.    ROS: All other systems are reviewed and negative. Unless otherwise mentioned in H&P    PHYSICAL EXAM: VS:  BP 106/60   Pulse 80   Ht 6\' 2"  (1.88 m)   Wt 233 lb 12.8 oz (106.1 kg)   SpO2 95%   BMI 30.02 kg/m  , BMI Body mass index is 30.02 kg/m. GEN: Well nourished, well developed, in no acute distress HEENT: normal Neck: no JVD, carotid bruits, or masses Cardiac: RRR;idistant  heart sounds,  2/6 systolic murmurs, rubs, or gallops,2+-3+ pitting edema. Scratches to his LE on the right.  Respiratory:  Bilateral rhonchi with frequent coughing. GI: soft, nontender, nondistended, + BS MS: no deformity or atrophy  Skin: warm and dry, no rash Neuro:  Strength and sensation are intact Psych: euthymic mood, full affect   EKG: Not completed this office.  Recent Labs: 02/01/2018: ALT 15 03/14/2018: B Natriuretic Peptide 375.8; Hemoglobin 10.2; Magnesium 2.1; Platelets 200 03/28/2018: BUN 33; Creatinine, Ser 1.33; Potassium 4.7; Sodium 146    Lipid Panel    Component Value Date/Time   CHOL 92 11/01/2015 0331   CHOL 139 11/20/2011 0624   TRIG 76 11/01/2015 0331   TRIG 108 11/20/2011 0624   HDL 31 (L) 11/01/2015 0331   HDL 26 (L) 11/20/2011 0624   CHOLHDL 3.0 11/01/2015 0331   VLDL 15 11/01/2015 0331   VLDL 22 11/20/2011 0624   LDLCALC 46 11/01/2015 0331   LDLCALC 91 11/20/2011 0624      Wt Readings from Last 3 Encounters:  05/25/18 233 lb 12.8 oz (106.1 kg)  05/19/18 180 lb (81.6 kg)  04/25/18 242 lb (109.8 kg)      Other studies Reviewed: Echocardiogram 2018-05-10  1. The left ventricle has moderately reduced  systolic function, with an ejection fraction of 35-40%. The cavity size was moderately dilated. Left ventricular diastology could not be evaluated.  2. The right ventricle has mildly reduced systolic function. The cavity was moderately enlarged. There is no increase in right ventricular wall thickness.  3. Left atrial size was severely dilated.  4. Right atrial size was severely dilated.  5. The mitral valve is normal in structure. Mitral valve regurgitation is mild to moderate by color flow Doppler. The MR jet is eccentric posteriorly directed.  6. The tricuspid valve is normal in structure.  7. The aortic valve is tricuspid Aortic valve regurgitation is mild by color flow Doppler.  8. The pulmonic valve was normal in structure.  9. There is moderate dilatation of the aortic root and of the ascending aorta. 10. Moderately dilated pulmonary artery. 11. Pulmonary hypertension is moderate. 12. The inferior vena cava was dilated in size with <50% respiratory variability. 13. Right atrial pressure is estimated at 15 mmHg.  CT of the Head 05/19/2018  IMPRESSION: 1. No acute intracranial abnormality. 2. Age-related cerebral atrophy with mild chronic small vessel ischemic disease, with small remote lacunar infarcts involving the bilateral basal ganglia and left thalamus.  ASSESSMENT AND PLAN:  1. Acute on chronic mixed CHF: He has continued to have volume overload despite diuretic therapy. His now congested and coughing. He may have a component of bronchitis versus pneumonia. He is more short of breath and states he is coughing up yellow phellem. I am reluctant to go up on his diuretic due to syncopal episodes and hypotension. I think he would be better served by being admitted for work up and treatment.   2. Chronic DOE with coughing: He may have some infection vs bronchitis. Will need CXR and further evaluation of his symptoms with possible antibiotic therapy.   3. ICD in situ: Will need  interrogation.   4. Syncopal episodes: He has had continued near syncopal episodes without loss of consciousness. He states he feels dizzy and has spots before his eyes that get bigger when he feel as if he might pass out.   He did not report that his ICD shocked him.  5. Cellulitis of the LEE: He has completed  a course of antibiotics but continues to scratch his legs with bleeding on the right LEE. No evidence of infection at this time.    Current medicines are reviewed at length with the patient today.  I have discussed this with Dr. Ellyn Hack, DOD, in the [place] office today. Based upon our findings, he will be transported to the ED for evaluation and need for medical admission with cardiology consult to manage CHF. Lenox Ahr, live interpretor for deaf  interpretor services has made himself available for assistance in communication.  I have spoken to [place]. Charge nurse at [facility] to inform her of transport to ED.    Labs/ tests ordered today include: None  Phill Myron. [place] Pugh, ANP, AACC   05/25/2018 2:52 PM    [facility] [place] [address] Office [phone] Fax [phone]

## 2018-05-25 ENCOUNTER — Ambulatory Visit (INDEPENDENT_AMBULATORY_CARE_PROVIDER_SITE_OTHER): Payer: 59 | Admitting: Adult Health

## 2018-05-25 ENCOUNTER — Other Ambulatory Visit: Payer: Self-pay

## 2018-05-25 ENCOUNTER — Encounter: Payer: Self-pay | Admitting: Adult Health

## 2018-05-25 ENCOUNTER — Encounter (HOSPITAL_COMMUNITY): Payer: Self-pay | Admitting: Emergency Medicine

## 2018-05-25 ENCOUNTER — Inpatient Hospital Stay: Admission: AD | Admit: 2018-05-25 | Payer: 59 | Source: Ambulatory Visit | Admitting: Cardiology

## 2018-05-25 ENCOUNTER — Emergency Department (HOSPITAL_COMMUNITY): Payer: PPO

## 2018-05-25 ENCOUNTER — Inpatient Hospital Stay (HOSPITAL_COMMUNITY)
Admission: EM | Admit: 2018-05-25 | Discharge: 2018-05-28 | DRG: 377 | Disposition: A | Discharge status: Home / Self Care | Payer: PPO | Attending: Family Medicine | Admitting: Family Medicine

## 2018-05-25 VITALS — BP 106/60 | HR 80 | Ht 74.0 in | Wt 233.8 lb

## 2018-05-25 DIAGNOSIS — I5032 Chronic diastolic (congestive) heart failure: Secondary | ICD-10-CM | POA: Diagnosis not present

## 2018-05-25 DIAGNOSIS — Z95810 Presence of automatic (implantable) cardiac defibrillator: Secondary | ICD-10-CM | POA: Diagnosis not present

## 2018-05-25 DIAGNOSIS — J40 Bronchitis, not specified as acute or chronic: Secondary | ICD-10-CM | POA: Diagnosis not present

## 2018-05-25 DIAGNOSIS — I130 Hypertensive heart and chronic kidney disease with heart failure and stage 1 through stage 4 chronic kidney disease, or unspecified chronic kidney disease: Secondary | ICD-10-CM | POA: Diagnosis present

## 2018-05-25 DIAGNOSIS — D500 Iron deficiency anemia secondary to blood loss (chronic): Secondary | ICD-10-CM | POA: Diagnosis present

## 2018-05-25 DIAGNOSIS — D649 Anemia, unspecified: Secondary | ICD-10-CM | POA: Diagnosis present

## 2018-05-25 DIAGNOSIS — K922 Gastrointestinal hemorrhage, unspecified: Principal | ICD-10-CM | POA: Diagnosis present

## 2018-05-25 DIAGNOSIS — N179 Acute kidney failure, unspecified: Secondary | ICD-10-CM | POA: Diagnosis present

## 2018-05-25 DIAGNOSIS — K7460 Unspecified cirrhosis of liver: Secondary | ICD-10-CM | POA: Diagnosis present

## 2018-05-25 DIAGNOSIS — K449 Diaphragmatic hernia without obstruction or gangrene: Secondary | ICD-10-CM | POA: Diagnosis present

## 2018-05-25 DIAGNOSIS — Z79899 Other long term (current) drug therapy: Secondary | ICD-10-CM | POA: Diagnosis not present

## 2018-05-25 DIAGNOSIS — I071 Rheumatic tricuspid insufficiency: Secondary | ICD-10-CM | POA: Diagnosis present

## 2018-05-25 DIAGNOSIS — R531 Weakness: Secondary | ICD-10-CM | POA: Diagnosis not present

## 2018-05-25 DIAGNOSIS — Z951 Presence of aortocoronary bypass graft: Secondary | ICD-10-CM | POA: Diagnosis not present

## 2018-05-25 DIAGNOSIS — Z9079 Acquired absence of other genital organ(s): Secondary | ICD-10-CM

## 2018-05-25 DIAGNOSIS — R0902 Hypoxemia: Secondary | ICD-10-CM | POA: Diagnosis not present

## 2018-05-25 DIAGNOSIS — N183 Chronic kidney disease, stage 3 (moderate): Secondary | ICD-10-CM | POA: Diagnosis present

## 2018-05-25 DIAGNOSIS — Z8249 Family history of ischemic heart disease and other diseases of the circulatory system: Secondary | ICD-10-CM | POA: Diagnosis not present

## 2018-05-25 DIAGNOSIS — H9190 Unspecified hearing loss, unspecified ear: Secondary | ICD-10-CM | POA: Diagnosis present

## 2018-05-25 DIAGNOSIS — J189 Pneumonia, unspecified organism: Secondary | ICD-10-CM | POA: Diagnosis not present

## 2018-05-25 DIAGNOSIS — Z8546 Personal history of malignant neoplasm of prostate: Secondary | ICD-10-CM

## 2018-05-25 DIAGNOSIS — R05 Cough: Secondary | ICD-10-CM | POA: Diagnosis not present

## 2018-05-25 DIAGNOSIS — I959 Hypotension, unspecified: Secondary | ICD-10-CM | POA: Diagnosis present

## 2018-05-25 DIAGNOSIS — I255 Ischemic cardiomyopathy: Secondary | ICD-10-CM

## 2018-05-25 DIAGNOSIS — K2980 Duodenitis without bleeding: Secondary | ICD-10-CM | POA: Diagnosis not present

## 2018-05-25 DIAGNOSIS — Z8673 Personal history of transient ischemic attack (TIA), and cerebral infarction without residual deficits: Secondary | ICD-10-CM | POA: Diagnosis not present

## 2018-05-25 DIAGNOSIS — R5381 Other malaise: Secondary | ICD-10-CM | POA: Diagnosis not present

## 2018-05-25 DIAGNOSIS — R195 Other fecal abnormalities: Secondary | ICD-10-CM | POA: Diagnosis present

## 2018-05-25 DIAGNOSIS — I509 Heart failure, unspecified: Secondary | ICD-10-CM

## 2018-05-25 DIAGNOSIS — Z8679 Personal history of other diseases of the circulatory system: Secondary | ICD-10-CM

## 2018-05-25 DIAGNOSIS — R55 Syncope and collapse: Secondary | ICD-10-CM | POA: Diagnosis not present

## 2018-05-25 DIAGNOSIS — Z8711 Personal history of peptic ulcer disease: Secondary | ICD-10-CM

## 2018-05-25 DIAGNOSIS — R7989 Other specified abnormal findings of blood chemistry: Secondary | ICD-10-CM | POA: Diagnosis not present

## 2018-05-25 DIAGNOSIS — Z7901 Long term (current) use of anticoagulants: Secondary | ICD-10-CM

## 2018-05-25 DIAGNOSIS — I2510 Atherosclerotic heart disease of native coronary artery without angina pectoris: Secondary | ICD-10-CM | POA: Diagnosis present

## 2018-05-25 DIAGNOSIS — Z79891 Long term (current) use of opiate analgesic: Secondary | ICD-10-CM

## 2018-05-25 DIAGNOSIS — I5043 Acute on chronic combined systolic (congestive) and diastolic (congestive) heart failure: Secondary | ICD-10-CM

## 2018-05-25 DIAGNOSIS — I10 Essential (primary) hypertension: Secondary | ICD-10-CM | POA: Diagnosis present

## 2018-05-25 DIAGNOSIS — R0602 Shortness of breath: Secondary | ICD-10-CM | POA: Diagnosis not present

## 2018-05-25 DIAGNOSIS — R778 Other specified abnormalities of plasma proteins: Secondary | ICD-10-CM | POA: Diagnosis present

## 2018-05-25 DIAGNOSIS — I5022 Chronic systolic (congestive) heart failure: Secondary | ICD-10-CM | POA: Diagnosis not present

## 2018-05-25 DIAGNOSIS — I110 Hypertensive heart disease with heart failure: Secondary | ICD-10-CM | POA: Diagnosis not present

## 2018-05-25 DIAGNOSIS — K3189 Other diseases of stomach and duodenum: Secondary | ICD-10-CM | POA: Diagnosis present

## 2018-05-25 DIAGNOSIS — I25810 Atherosclerosis of coronary artery bypass graft(s) without angina pectoris: Secondary | ICD-10-CM | POA: Diagnosis not present

## 2018-05-25 DIAGNOSIS — N200 Calculus of kidney: Secondary | ICD-10-CM | POA: Diagnosis not present

## 2018-05-25 DIAGNOSIS — R0609 Other forms of dyspnea: Secondary | ICD-10-CM | POA: Diagnosis not present

## 2018-05-25 DIAGNOSIS — K921 Melena: Secondary | ICD-10-CM | POA: Diagnosis not present

## 2018-05-25 DIAGNOSIS — R933 Abnormal findings on diagnostic imaging of other parts of digestive tract: Secondary | ICD-10-CM | POA: Diagnosis not present

## 2018-05-25 DIAGNOSIS — I4821 Permanent atrial fibrillation: Secondary | ICD-10-CM | POA: Diagnosis present

## 2018-05-25 DIAGNOSIS — I5042 Chronic combined systolic (congestive) and diastolic (congestive) heart failure: Secondary | ICD-10-CM | POA: Diagnosis present

## 2018-05-25 DIAGNOSIS — R0600 Dyspnea, unspecified: Secondary | ICD-10-CM

## 2018-05-25 LAB — COMPREHENSIVE METABOLIC PANEL
ALK PHOS: 95 U/L (ref 38–126)
ALT: 16 U/L (ref 0–44)
AST: 26 U/L (ref 15–41)
Albumin: 2.5 g/dL — ABNORMAL LOW (ref 3.5–5.0)
Anion gap: 5 (ref 5–15)
BUN: 24 mg/dL — ABNORMAL HIGH (ref 8–23)
CO2: 24 mmol/L (ref 22–32)
Calcium: 8.5 mg/dL — ABNORMAL LOW (ref 8.9–10.3)
Chloride: 111 mmol/L (ref 98–111)
Creatinine, Ser: 1.23 mg/dL (ref 0.61–1.24)
GFR calc Af Amer: >60 mL/min (ref >60)
GFR calc non Af Amer: 55 mL/min — ABNORMAL LOW (ref >60)
GLUCOSE: 89 mg/dL (ref 70–99)
Potassium: 4 mmol/L (ref 3.5–5.1)
Sodium: 140 mmol/L (ref 135–145)
Total Bilirubin: 0.7 mg/dL (ref 0.3–1.2)
Total Protein: 4.9 g/dL — ABNORMAL LOW (ref 6.5–8.1)

## 2018-05-25 LAB — CBC WITH DIFFERENTIAL/PLATELET
Abs Immature Granulocytes: 0.02 10*3/uL (ref 0.00–0.07)
Basophils Absolute: 0.1 10*3/uL (ref 0.0–0.1)
Basophils Relative: 1 %
Eosinophils Absolute: 0.2 10*3/uL (ref 0.0–0.5)
Eosinophils Relative: 4 %
HCT: 27.8 % — ABNORMAL LOW (ref 39.0–52.0)
HEMOGLOBIN: 8.4 g/dL — AB (ref 13.0–17.0)
Immature Granulocytes: 0 %
Lymphocytes Relative: 14 %
Lymphs Abs: 0.8 10*3/uL (ref 0.7–4.0)
MCH: 25.2 pg — ABNORMAL LOW (ref 26.0–34.0)
MCHC: 30.2 g/dL (ref 30.0–36.0)
MCV: 83.5 fL (ref 80.0–100.0)
Monocytes Absolute: 0.8 10*3/uL (ref 0.1–1.0)
Monocytes Relative: 14 %
Neutro Abs: 3.6 10*3/uL (ref 1.7–7.7)
Neutrophils Relative %: 67 %
Platelets: 241 10*3/uL (ref 150–400)
RBC: 3.33 MIL/uL — ABNORMAL LOW (ref 4.22–5.81)
RDW: 17.4 % — ABNORMAL HIGH (ref 11.5–15.5)
WBC: 5.4 10*3/uL (ref 4.0–10.5)
nRBC: 0 % (ref 0.0–0.2)

## 2018-05-25 LAB — TROPONIN I: Troponin I: 0.04 ng/mL (ref <0.03)

## 2018-05-25 LAB — POC OCCULT BLOOD, ED: Fecal Occult Bld: POSITIVE — AB

## 2018-05-25 LAB — BRAIN NATRIURETIC PEPTIDE: B Natriuretic Peptide: 292.5 pg/mL — ABNORMAL HIGH (ref 0.0–100.0)

## 2018-05-25 MED ORDER — ALBUTEROL SULFATE (2.5 MG/3ML) 0.083% IN NEBU: 2.5000 mg | INHALATION_SOLUTION | Freq: Four times a day (QID) | RESPIRATORY_TRACT | Status: DC | PRN

## 2018-05-25 MED ORDER — ISOSORBIDE MONONITRATE ER 60 MG PO TB24
90.0000 mg | ORAL_TABLET | Freq: Every day | ORAL | Status: DC
  Administered 2018-05-26: 90 mg via ORAL
  Filled 2018-05-25: qty 1

## 2018-05-25 MED ORDER — PANTOPRAZOLE SODIUM 40 MG IV SOLR
40.0000 mg | Freq: Two times a day (BID) | INTRAVENOUS | Status: DC
  Administered 2018-05-26 – 2018-05-27 (×3): 40 mg via INTRAVENOUS
  Filled 2018-05-25 (×3): qty 40

## 2018-05-25 MED ORDER — SODIUM CHLORIDE 0.9 % IV SOLN: 250.0000 mL | INTRAVENOUS | Status: DC | PRN

## 2018-05-25 MED ORDER — LOSARTAN POTASSIUM-HCTZ 50-12.5 MG PO TABS: 1.0000 | ORAL_TABLET | Freq: Every day | ORAL | Status: DC

## 2018-05-25 MED ORDER — SODIUM CHLORIDE 0.9 % IV SOLN
8.0000 mg/h | INTRAVENOUS | Status: DC
  Filled 2018-05-25: qty 80

## 2018-05-25 MED ORDER — INFLUENZA VAC SPLIT HIGH-DOSE 0.5 ML IM SUSY: 0.5000 mL | PREFILLED_SYRINGE | INTRAMUSCULAR | Status: DC | PRN

## 2018-05-25 MED ORDER — HYDROCHLOROTHIAZIDE 12.5 MG PO CAPS
12.5000 mg | ORAL_CAPSULE | Freq: Every day | ORAL | Status: DC
  Filled 2018-05-25: qty 1

## 2018-05-25 MED ORDER — SODIUM CHLORIDE 0.9 % IV SOLN
80.0000 mg | Freq: Once | INTRAVENOUS | Status: AC
  Administered 2018-05-25: 80 mg via INTRAVENOUS
  Filled 2018-05-25: qty 80

## 2018-05-25 MED ORDER — FUROSEMIDE 20 MG PO TABS
80.0000 mg | ORAL_TABLET | Freq: Every day | ORAL | Status: DC
  Administered 2018-05-26: 80 mg via ORAL
  Filled 2018-05-25: qty 4

## 2018-05-25 MED ORDER — SODIUM CHLORIDE 0.9% FLUSH
3.0000 mL | Freq: Two times a day (BID) | INTRAVENOUS | Status: DC
  Administered 2018-05-25 – 2018-05-28 (×6): 3 mL via INTRAVENOUS

## 2018-05-25 MED ORDER — CARVEDILOL 12.5 MG PO TABS
12.5000 mg | ORAL_TABLET | Freq: Two times a day (BID) | ORAL | Status: DC
  Administered 2018-05-26: 12.5 mg via ORAL
  Filled 2018-05-25 (×2): qty 1

## 2018-05-25 MED ORDER — ACETAMINOPHEN 325 MG PO TABS
650.0000 mg | ORAL_TABLET | ORAL | Status: DC | PRN
  Administered 2018-05-26: 650 mg via ORAL
  Filled 2018-05-25: qty 2

## 2018-05-25 MED ORDER — LOSARTAN POTASSIUM 50 MG PO TABS
50.0000 mg | ORAL_TABLET | Freq: Every day | ORAL | Status: DC
  Administered 2018-05-26: 50 mg via ORAL
  Filled 2018-05-25: qty 1

## 2018-05-25 MED ORDER — SODIUM CHLORIDE 0.9% FLUSH: 3.0000 mL | INTRAVENOUS | Status: DC | PRN

## 2018-05-25 MED ORDER — ONDANSETRON HCL 4 MG/2ML IJ SOLN: 4.0000 mg | Freq: Four times a day (QID) | INTRAMUSCULAR | Status: DC | PRN

## 2018-05-25 MED ORDER — SIMVASTATIN 20 MG PO TABS
20.0000 mg | ORAL_TABLET | Freq: Every day | ORAL | Status: DC
  Administered 2018-05-26 – 2018-05-27 (×2): 20 mg via ORAL
  Filled 2018-05-25 (×3): qty 1

## 2018-05-25 NOTE — ED Triage Notes (Signed)
Per [facility]-  Pt pt picked up from heartcare due to SOB and productive cough with yellow phlegm x1 week.  Send to ED to ruleout pneumonia.

## 2018-05-25 NOTE — ED Provider Notes (Addendum)
[facility] EMERGENCY DEPARTMENT Provider Note   CSN: [id number] Arrival date & time: 05/25/18  1613    History   Chief Complaint Chief Complaint  Patient presents with   Shortness of Breath   Cough    HPI Thomas [name] is a 84 y.o. male.    Patient is deaf and in person translator used. HPI Patient presents from cardiology with shortness of breath and cough.  Has had more shortness of breath over the last 2 weeks.  Has had cough with some yellow sputum production.  Also there was worry of volume overload.  Seen in cardiology and sent in for further work-up.  Likely require inpatient diuresis since he has has hypotension and heart failure.  No chest pain.  Has had increased swelling in his legs.  No fevers.  Few days ago had a syncopal episode while out.  Reportedly was confused after.  Did not hit his head.  He is on Eliquis. Past Medical History:  Diagnosis Date   AICD (automatic cardioverter/defibrillator) present    Biventricular ICD (implantable cardioverter-defibrillator) in place 03/24/2005   Implantation of a Medtronic Adapta ADDRO1, serial number [id number]   CHF (congestive heart failure) (HCC)    CKD (chronic kidney disease), stage III ([place])    Coronary artery disease    a. s/p CABG 1986. b. Multiple PCIs/caths. c. 09/2013: s/p PTCA and BMS to SVG-OM.   Deaf    Dysrhythmia    History of abdominal aortic aneurysm    History of bleeding peptic ulcer 1980   History of epididymitis 2013   HTN (hypertension)    Hydronephrosis with ureteropelvic junction obstruction    Hydroureter on left 2009   Hypertension    Ischemic cardiomyopathy    a. Prior EF 30-35%, s/p BIV-ICD. b. 09/2013: EF 45-50%.   Moderate tricuspid regurgitation    PAF (paroxysmal atrial fibrillation) (HCC)    Presence of permanent cardiac pacemaker    Prostate cancer ([place])    Status post coronary artery bypass grafting 1986   LIMA to the LAD, SVG to OM, SVG to  RCA   Testicular swelling     Patient Active Problem List   Diagnosis Date Noted   Acute on chronic combined systolic and diastolic CHF (congestive heart failure) ([place])    GI bleed 05/26/2018   Occult GI bleeding 05/25/2018   Normocytic anemia 05/25/2018   Elevated troponin 05/25/2018   Anemia    Lymphedema 02/28/2018   Weakness 07/15/2016   Fatigue 07/15/2016   CVA (cerebral infarction) 10/31/2015   Bulbous urethral stricture 09/18/2015   Bilateral deafness 08/19/2015   Bilateral cataracts 08/19/2015   Acid reflux 08/19/2015   HLD (hyperlipidemia) 08/19/2015   BP (high blood pressure) 08/19/2015   Myocardial infarction ([place]) 08/19/2015   Calculus of kidney 08/19/2015   Artificial cardiac pacemaker 08/19/2015   Gastroduodenal ulcer 08/19/2015   Dupuytren's contracture of foot 08/19/2015   Malignant neoplasm of prostate ([place]) 08/19/2015   Microhematuria 08/19/2015   Coronary artery disease 02/22/2015   Status post coronary artery bypass grafting 02/22/2015   Benign essential HTN 04/02/2014   Hematochezia 02/20/2014   Moderate tricuspid regurgitation 02/20/2014   Mobitz type II atrioventricular block 12/12/2013   Long term current use of anticoagulant 10/18/2013   Cardiomyopathy, ischemic: Ejection fraction 45% 11/11/2012   CAD Status post coronary artery bypass grafting: 1995 11/11/2012   Permanent atrial fibrillation ([place]) 11/11/2012   Biventricular ICD Medtronic Viva single chamber October 2014 11/11/2012   Chronic  systolic heart failure ([place]) 11/11/2012    Past Surgical History:  Procedure Laterality Date   2-D echocardiogram  11/20/2011   Ejection fraction 30-35% moderate concentric left ventricular hypertrophy. Left atrium is moderately dilated. Mild MR. Mild or   BI-VENTRICULAR IMPLANTABLE CARDIOVERTER DEFIBRILLATOR N/A 12/16/2012   Procedure: BI-VENTRICULAR IMPLANTABLE CARDIOVERTER DEFIBRILLATOR  (CRT-D);  Surgeon: [name], MD;  Location: [facility] CATH LAB;  Service: Cardiovascular;  Laterality: N/A;   CARDIAC CATHETERIZATION  12/10/2011   SVG to OM widely patent.  LIMA to LAD patent   CATARACT EXTRACTION W/PHACO Right 10/12/2017   Procedure: CATARACT EXTRACTION PHACO AND INTRAOCULAR LENS PLACEMENT (IOC);  Surgeon: Birder Robson, MD;  Location: [facility] ORS;  Service: Ophthalmology;  Laterality: Right;  [place] 00:57 AP% 15.9 CDE 9.07 Fluid pack lot # [id number]   CORONARY ARTERY BYPASS GRAFT  1986   ESOPHAGOGASTRODUODENOSCOPY (EGD) WITH PROPOFOL N/A 05/27/2018   Procedure: ESOPHAGOGASTRODUODENOSCOPY (EGD) WITH PROPOFOL;  Surgeon: Clarene Essex, MD;  Location: [facility];  Service: Endoscopy;  Laterality: N/A;   INSERT / REPLACE / REMOVE PACEMAKER     LEFT HEART CATHETERIZATION WITH CORONARY/GRAFT ANGIOGRAM N/A 12/10/2011   Procedure: LEFT HEART CATHETERIZATION WITH Beatrix Fetters;  Surgeon: Sanda Klein, MD;  Location: [facility] CATH LAB;  Service: Cardiovascular;  Laterality: N/A;   LEFT HEART CATHETERIZATION WITH CORONARY/GRAFT ANGIOGRAM N/A 09/25/2013   Procedure: LEFT HEART CATHETERIZATION WITH Beatrix Fetters;  Surgeon: Blane Ohara, MD;  Location: [facility] CATH LAB;  Service: Cardiovascular;  Laterality: N/A;   Persantine Myoview  05/06/2010   Post-rest ejection fraction 30%. No significant ischemia demonstrated. Compared to previous study there is no significant change.   TRANSURETHRAL RESECTION OF PROSTATE     s/p        Home Medications    Prior to Admission medications   Medication Sig Start Date End Date Taking? Authorizing Provider  albuterol (PROVENTIL HFA;VENTOLIN HFA) 108 (90 Base) MCG/ACT inhaler Inhale 2 puffs into the lungs 4 (four) times daily as needed.  10/30/16  Yes [provider]  carvedilol (COREG) 12.5 MG tablet Take 12.5 mg by mouth 2 (two) times daily with a meal.   Yes [provider]  HYDROCORTISONE, TOPICAL, 2 % LOTN Apply 40 oz topically 2  (two) times daily. APPLY BID TO AFFECTED AREA 04/25/18  Yes Lendon Colonel, NP  isosorbide mononitrate (IMDUR) 60 MG 24 hr tablet Take 1.5 tablets by mouth daily Patient taking differently: Take 90 mg by mouth daily. Take 1.5 tablets by mouth daily 04/27/18  Yes Croitoru, Mihai, MD  simvastatin (ZOCOR) 20 MG tablet TAKE 1 TABLET BY MOUTH DAILY Patient taking differently: Take 20 mg by mouth daily.  04/11/18  Yes Croitoru, Mihai, MD  vitamin B-12 (CYANOCOBALAMIN) 500 MCG tablet Take 500 mcg by mouth daily.   Yes [provider]  apixaban (ELIQUIS) 2.5 MG TABS tablet Take 1 tablet (2.5 mg total) by mouth 2 (two) times daily for 30 days. (start on 3/21.  Please review dosing with your PCP prior to restarting) 06/04/18 07/04/18  Elodia Florence., MD  ferrous sulfate 325 (65 FE) MG tablet Take 1 tablet (325 mg total) by mouth daily for 30 days. 05/28/18 06/27/18  Elodia Florence., MD  mirabegron ER (MYRBETRIQ) 50 MG TB24 tablet Take 1 tablet (50 mg total) by mouth daily. Patient not taking: Reported on 05/25/2018 12/13/17   Bjorn Loser, MD  pantoprazole (PROTONIX) 40 MG tablet Take 1 tablet (40 mg total) by mouth daily for 30  days. 05/29/18 06/28/18  Elodia Florence., MD    Family History Family History  Problem Relation Age of Onset   Hypertension Father     Social History Social History   Tobacco Use   Smoking status: Former Smoker    Last attempt to quit: 03/15/1985    Years since quitting: 33.2   Smokeless tobacco: Never Used  Substance Use Topics   Alcohol use: No    Comment: occas.   Drug use: No     Allergies   Phenazopyridine and Ramipril   Review of Systems Review of Systems  Constitutional: Negative for appetite change.  HENT: Positive for congestion.   Eyes: Negative for photophobia.  Respiratory: Positive for cough, shortness of breath and wheezing.   Cardiovascular: Positive for leg swelling. Negative for chest pain.    Gastrointestinal: Negative for abdominal pain.  Endocrine: Negative for polyuria.  Genitourinary: Negative for flank pain.  Musculoskeletal: Negative for back pain.  Neurological: Positive for syncope.  Psychiatric/Behavioral: Negative for confusion.     Physical Exam Updated Vital Signs BP 128/74 (BP Location: Right Arm)    Pulse 82    Temp 98 F (36.7 C)    Resp (!) 22    Ht 6\' 2"  (1.88 m)    Wt 100.1 kg    SpO2 94%    BMI 28.34 kg/m   Physical Exam Vitals signs and nursing note reviewed.  Constitutional:      Appearance: He is well-developed.  HENT:     Head: Atraumatic.  Eyes:     Pupils: Pupils are equal, round, and reactive to light.  Neck:     Musculoskeletal: Neck supple.  Cardiovascular:     Rate and Rhythm: Regular rhythm.  Pulmonary:     Comments: Diffuse harsh breath sounds with wheezes and prolonged expirations. Chest:     Chest wall: No tenderness.     Comments: AICD to left chest wall. Abdominal:     Tenderness: There is no abdominal tenderness.  Musculoskeletal:     Right lower leg: Edema present.     Left lower leg: Edema present.     Comments: Pitting edema bilateral lower extremities.  Skin:    General: Skin is warm.     Capillary Refill: Capillary refill takes less than 2 seconds.  Neurological:     General: No focal deficit present.     Mental Status: He is alert.      ED Treatments / Results  Labs (all labs ordered are listed, but only abnormal results are displayed) Labs Reviewed  COMPREHENSIVE METABOLIC PANEL - Abnormal; Notable for the following components:      Result Value   BUN 24 (*)    Calcium 8.5 (*)    Total Protein 4.9 (*)    Albumin 2.5 (*)    GFR calc non Af Amer 55 (*)    All other components within normal limits  BRAIN NATRIURETIC PEPTIDE - Abnormal; Notable for the following components:   B Natriuretic Peptide 292.5 (*)    All other components within normal limits  TROPONIN I - Abnormal; Notable for the following  components:   Troponin I 0.04 (*)    All other components within normal limits  CBC WITH DIFFERENTIAL/PLATELET - Abnormal; Notable for the following components:   RBC 3.33 (*)    Hemoglobin 8.4 (*)    HCT 27.8 (*)    MCH 25.2 (*)    RDW 17.4 (*)    All other components  within normal limits  BASIC METABOLIC PANEL - Abnormal; Notable for the following components:   Creatinine, Ser 1.28 (*)    Calcium 8.1 (*)    GFR calc non Af Amer 52 (*)    All other components within normal limits  TROPONIN I - Abnormal; Notable for the following components:   Troponin I 0.03 (*)    All other components within normal limits  HEMOGLOBIN AND HEMATOCRIT, BLOOD - Abnormal; Notable for the following components:   Hemoglobin 8.1 (*)    HCT 26.7 (*)    All other components within normal limits  HEMOGLOBIN AND HEMATOCRIT, BLOOD - Abnormal; Notable for the following components:   Hemoglobin 8.1 (*)    HCT 26.7 (*)    All other components within normal limits  HEMOGLOBIN AND HEMATOCRIT, BLOOD - Abnormal; Notable for the following components:   Hemoglobin 8.7 (*)    HCT 28.5 (*)    All other components within normal limits  IRON AND TIBC - Abnormal; Notable for the following components:   Iron 22 (*)    Saturation Ratios 6 (*)    All other components within normal limits  FERRITIN - Abnormal; Notable for the following components:   Ferritin 21 (*)    All other components within normal limits  BASIC METABOLIC PANEL - Abnormal; Notable for the following components:   BUN 24 (*)    Creatinine, Ser 1.60 (*)    Calcium 8.4 (*)    GFR calc non Af Amer 40 (*)    GFR calc Af Amer 46 (*)    All other components within normal limits  CBC - Abnormal; Notable for the following components:   RBC 3.19 (*)    Hemoglobin 8.0 (*)    HCT 25.5 (*)    MCV 79.9 (*)    MCH 25.1 (*)    RDW 17.3 (*)    All other components within normal limits  BASIC METABOLIC PANEL - Abnormal; Notable for the following components:     Glucose, Bld 116 (*)    BUN 32 (*)    Creatinine, Ser 1.73 (*)    Calcium 8.5 (*)    GFR calc non Af Amer 36 (*)    GFR calc Af Amer 42 (*)    All other components within normal limits  CBC - Abnormal; Notable for the following components:   RBC 3.40 (*)    Hemoglobin 8.6 (*)    HCT 27.8 (*)    MCH 25.3 (*)    RDW 17.4 (*)    All other components within normal limits  POC OCCULT BLOOD, ED - Abnormal; Notable for the following components:   Fecal Occult Bld POSITIVE (*)    All other components within normal limits  MRSA PCR SCREENING  VITAMIN B12  FOLATE  MAGNESIUM  TYPE AND SCREEN  ABO/RH    EKG None ED ECG REPORT   Date: 05/25/2018  Rate: 60  Rhythm: paced  QRS Axis: paced  Intervals: paced  ST/T Wave abnormalities: paced     Radiology No results found.  Procedures Procedures (including critical care time)  Medications Ordered in ED Medications  lactated ringers infusion ( Intravenous New Bag/Given 05/26/18 2247)  pantoprazole (PROTONIX) 80 mg in sodium chloride 0.9 % 100 mL IVPB (0 mg Intravenous Stopped 05/25/18 2157)  ferumoxytol (FERAHEME) 510 mg in sodium chloride 0.9 % 100 mL IVPB (510 mg Intravenous New Bag/Given 05/28/18 0005)     Initial Impression / Assessment and Plan /  ED Course  I have reviewed the triage vital signs and the nursing notes.  Pertinent labs & imaging results that were available during my care of the patient were reviewed by me and considered in my medical decision making (see chart for details).       Patient presented with URI symptoms coughing shortness of breath.  Came from cardiology.  Cardiology believes he would need admission to help manage fluid status.  BNP is elevated although x-ray overall looks pretty good.  Does have edema on his legs and weight is up.  However does have an anemia.  He is on Eliquis for his atrial fibrillation.  He is guaiac positive with brown stool.  Protonix drip started.  Patient may need blood  transfusion but I think will need to be judiciously done due to patient's volume overload.  Will admit to.V   Final Clinical Impressions(s) / ED Diagnoses   Final diagnoses:  Dyspnea, unspecified type  Congestive heart failure, unspecified HF chronicity, unspecified heart failure type (HCC)  Anemia, unspecified type  Gastrointestinal hemorrhage, unspecified gastrointestinal hemorrhage type    ED Discharge Orders         Ordered    apixaban (ELIQUIS) 2.5 MG TABS tablet  2 times daily     05/28/18 1144    pantoprazole (PROTONIX) 40 MG tablet  Daily     05/28/18 1144    ferrous sulfate 325 (65 FE) MG tablet  Daily     05/28/18 1144    Increase activity slowly     05/28/18 1144    Diet - low sodium heart healthy     05/28/18 1144    Discharge instructions    Comments:  You were seen for low blood counts and concern for gastrointestinal bleeding.  You had an EGD with Dr. Watt Climes which showed erythematous duodenopathy.  Take protonix 40 mg daily.  Please follow up with Dr. Watt Climes as an outpatient.  You'll discuss possible colonoscopy with him as an outpatient.  Do not take your eliquis until 3/21.  After 3/21, you can restart your eliquis,  I've changed the dose to 2.5 mg twice daily because of your kidney function.  Please ask your PCP what dose you should take of your eliquis (if your kidney function improves, you maybe able to resume your prior dose).  If you notice signs or symptoms of bleeding, stop your eliquis.  Stop your losartan-HCTZ combination pill.  Do not take your lasix now.  Please follow up with your PCP in a few days (between 3/16 - 3/19, ideally) to repeat labs.  Do not restart your lasix or losartan until your PCP or cardiologist tells you it's ok.  (we have stopped these medications because you had low blood pressure and some kidney injury, you need repeat labs early next week to recheck your kidney function - your repeat kidney labs will also help [place] tell what dose of eliquis  you will need to restart next week).  I think your fainting was probably related to low blood pressure.  Continue iron daily.  Please follow up repeat iron studies as an outpatient with your PCP.  Discuss repeat iron infusion with them as well.  Your CT scan showed early suspected cirrhosis.  Please follow this up with your primary care doctor.  Please discuss your imaging studies from this hospital stay with your PCP.  Return for new, recurrent, or worsening symptoms.  Please ask your PCP to request records from this hospitalization so they  know what was done and what the next steps will be.   05/28/18 1144    (HEART FAILURE PATIENTS) Call MD:  Anytime you have any of the following symptoms: 1) 3 pound weight gain in 24 hours or 5 pounds in 1 week 2) shortness of breath, with or without a dry hacking cough 3) swelling in the hands, feet or stomach 4) if you have to sleep on extra pillows at night in order to breathe.     05/28/18 1144    Call MD for:  persistant dizziness or light-headedness     05/28/18 1144    Call MD for:  extreme fatigue     05/28/18 1144    Call MD for:  hives     05/28/18 1144    Call MD for:  difficulty breathing, headache or visual disturbances     05/28/18 1144    Call MD for:  redness, tenderness, or signs of infection (pain, swelling, redness, odor or green/yellow discharge around incision site)     05/28/18 1144    Call MD for:  severe uncontrolled pain     05/28/18 1144    Call MD for:  persistant nausea and vomiting     05/28/18 1144           Davonna Belling, MD 05/25/18 2012    Davonna Belling, MD 06/09/18 [phone]

## 2018-05-25 NOTE — ED Notes (Addendum)
ED TO INPATIENT HANDOFF REPORT  ED Nurse Name and Phone #: Brallan Denio [phone]  S Name/Age/Gender Thomas [name] 84 y.o. male Room/Bed: 019C/019C  Code Status   Code Status: Full Code  Home/SNF/Other floor Patient oriented to: self, place, time and situation Is this baseline? Yes   Triage Complete: Triage complete  Chief Complaint Possible PNA  Triage Note Per [facility]-  Pt pt picked up from heartcare due to SOB and productive cough with yellow phlegm x1 week.  Send to ED to ruleout pneumonia.    Allergies Allergies  Allergen Reactions  . Phenazopyridine Nausea Only and Other (See Comments)    GI UPSET  . Ramipril Other (See Comments)    unk    Level of Care/Admitting Diagnosis ED Disposition    ED Disposition Condition [facility] Area: [facility] [[id number]]  Level of Care: Telemetry Medical [104]  I expect the patient will be discharged within 24 hours: Yes  LOW acuity---Tx typically complete <24 hrs---ACUTE conditions typically can be evaluated <24 hours---LABS likely to return to acceptable levels <24 hours---IS near functional baseline---EXPECTED to return to current living arrangement---NOT newly hypoxic: Does not meet criteria for 5C-Observation unit  Diagnosis: Occult GI bleeding [1914782]  Admitting Physician: Vianne Bulls [[id number]]  Attending Physician: Vianne Bulls [[id number]]  PT Class (Do Not Modify): Observation [104]  PT Acc Code (Do Not Modify): Observation [10022]       B Medical/Surgery History Past Medical History:  Diagnosis Date  . AICD (automatic cardioverter/defibrillator) present   . Biventricular ICD (implantable cardioverter-defibrillator) in place 03/24/2005   Implantation of a Medtronic Adapta ADDRO1, serial number [id number]  . CHF (congestive heart failure) ([place])   . CKD (chronic kidney disease), stage III ([place])   . Coronary artery disease    a. s/p CABG 1986. b. Multiple PCIs/caths. c. 09/2013: s/p  PTCA and BMS to SVG-OM.  [name]   . Dysrhythmia   . History of abdominal aortic aneurysm   . History of bleeding peptic ulcer 1980  . History of epididymitis 2013  . HTN (hypertension)   . Hydronephrosis with ureteropelvic junction obstruction   . Hydroureter on left 2009  . Hypertension   . Ischemic cardiomyopathy    a. Prior EF 30-35%, s/p BIV-ICD. b. 09/2013: EF 45-50%.  . Moderate tricuspid regurgitation   . PAF (paroxysmal atrial fibrillation) ([place])   . Presence of permanent cardiac pacemaker   . Prostate cancer ([place])   . Status post coronary artery bypass grafting 1986   LIMA to the LAD, SVG to OM, SVG to RCA  . Testicular swelling    Past Surgical History:  Procedure Laterality Date  . 2-D echocardiogram  11/20/2011   Ejection fraction 30-35% moderate concentric left ventricular hypertrophy. Left atrium is moderately dilated. Mild MR. Mild or  . BI-VENTRICULAR IMPLANTABLE CARDIOVERTER DEFIBRILLATOR N/A 12/16/2012   Procedure: BI-VENTRICULAR IMPLANTABLE CARDIOVERTER DEFIBRILLATOR  (CRT-D);  Surgeon: [name], MD;  Location: [facility] CATH LAB;  Service: Cardiovascular;  Laterality: N/A;  . CARDIAC CATHETERIZATION  12/10/2011   SVG to OM widely patent.  LIMA to LAD patent  . CATARACT EXTRACTION W/PHACO Right 10/12/2017   Procedure: CATARACT EXTRACTION PHACO AND INTRAOCULAR LENS PLACEMENT (IOC);  Surgeon: Birder Robson, MD;  Location: [facility] ORS;  Service: Ophthalmology;  Laterality: Right;  [place] 00:57 AP% 15.9 CDE 9.07 Fluid pack lot # [id number]  . CORONARY ARTERY BYPASS GRAFT  1986  . INSERT / REPLACE / REMOVE PACEMAKER    .  LEFT HEART CATHETERIZATION WITH CORONARY/GRAFT ANGIOGRAM N/A 12/10/2011   Procedure: LEFT HEART CATHETERIZATION WITH Beatrix Fetters;  Surgeon: Sanda Klein, MD;  Location: [place] [facility] CATH LAB;  Service: Cardiovascular;  Laterality: N/A;  . LEFT HEART CATHETERIZATION WITH CORONARY/GRAFT ANGIOGRAM N/A 09/25/2013   Procedure: LEFT HEART CATHETERIZATION WITH  Beatrix Fetters;  Surgeon: Blane Ohara, MD;  Location: [facility] CATH LAB;  Service: Cardiovascular;  Laterality: N/A;  . Persantine Myoview  05/06/2010   Post-rest ejection fraction 30%. No significant ischemia demonstrated. Compared to previous study there is no significant change.  . TRANSURETHRAL RESECTION OF PROSTATE     s/p     A IV Location/Drains/Wounds Patient Lines/Drains/Airways Status   Active Line/Drains/Airways    Name:   Placement date:   Placement time:   Site:   Days:   Peripheral IV 05/25/18 Right Antecubital   05/25/18    1652    Antecubital   less than 1   Incision (Closed) 10/12/17 Eye Right   10/12/17    0821     225          Intake/Output Last 24 hours No intake or output data in the 24 hours ending 05/25/18 2101  Labs/Imaging Results for orders placed or performed during the hospital encounter of 05/25/18 (from the past 48 hour(s))  Comprehensive metabolic panel     Status: Abnormal   Collection Time: 05/25/18  4:51 PM  Result Value Ref Range   Sodium 140 135 - 145 mmol/L   Potassium 4.0 3.5 - 5.1 mmol/L   Chloride 111 98 - 111 mmol/L   CO2 24 22 - 32 mmol/L   Glucose, Bld 89 70 - 99 mg/dL   BUN 24 (H) 8 - 23 mg/dL   Creatinine, Ser 1.23 0.61 - 1.24 mg/dL   Calcium 8.5 (L) 8.9 - 10.3 mg/dL   Total Protein 4.9 (L) 6.5 - 8.1 g/dL   Albumin 2.5 (L) 3.5 - 5.0 g/dL   AST 26 15 - 41 U/L   ALT 16 0 - 44 U/L   Alkaline Phosphatase 95 38 - 126 U/L   Total Bilirubin 0.7 0.3 - 1.2 mg/dL   GFR calc non Af Amer 55 (L) >60 mL/min   GFR calc Af Amer >60 >60 mL/min   Anion gap 5 5 - 15    Comment: Performed at [facility], [address] [place], [place] [address]  Brain natriuretic peptide     Status: Abnormal   Collection Time: 05/25/18  4:51 PM  Result Value Ref Range   B Natriuretic Peptide 292.5 (H) 0.0 - 100.0 pg/mL    Comment: Performed at [facility] [address] [place], [place] [address]  Troponin I - Once     Status:  Abnormal   Collection Time: 05/25/18  4:51 PM  Result Value Ref Range   Troponin I 0.04 (HH) <0.03 ng/mL    Comment: CRITICAL RESULT CALLED TO, READ BACK BY AND VERIFIED WITHDossie Arbour 1742 05/25/2018 D BRADLEY Performed at [facility] [address] [place], [place] [address]   CBC with Differential     Status: Abnormal   Collection Time: 05/25/18  4:51 PM  Result Value Ref Range   WBC 5.4 4.0 - 10.5 K/uL   RBC 3.33 (L) 4.22 - 5.81 MIL/uL   Hemoglobin 8.4 (L) 13.0 - 17.0 g/dL   HCT 27.8 (L) 39.0 - 52.0 %   MCV 83.5 80.0 - 100.0 fL   MCH  25.2 (L) 26.0 - 34.0 pg   MCHC 30.2 30.0 - 36.0 g/dL   RDW 17.4 (H) 11.5 - 15.5 %   Platelets 241 150 - 400 K/uL   nRBC 0.0 0.0 - 0.2 %   Neutrophils Relative % 67 %   Neutro Abs 3.6 1.7 - 7.7 K/uL   Lymphocytes Relative 14 %   Lymphs Abs 0.8 0.7 - 4.0 K/uL   Monocytes Relative 14 %   Monocytes Absolute 0.8 0.1 - 1.0 K/uL   Eosinophils Relative 4 %   Eosinophils Absolute 0.2 0.0 - 0.5 K/uL   Basophils Relative 1 %   Basophils Absolute 0.1 0.0 - 0.1 K/uL   Immature Granulocytes 0 %   Abs Immature Granulocytes 0.02 0.00 - 0.07 K/uL    Comment: Performed at [facility] [address] [place], [place] [address]  POC occult blood, ED     Status: Abnormal   Collection Time: 05/25/18  8:08 PM  Result Value Ref Range   Fecal Occult Bld POSITIVE (A) NEGATIVE   Dg Chest 2 View  Result Date: 05/25/2018 CLINICAL DATA:  Short of breath productive cough COMPARISON:  03/14/2018 FINDINGS: CABG.  AICD.  Cardiac enlargement. Small pleural effusions unchanged from the prior study. Negative for edema or pneumonia. IMPRESSION: Chronic small pleural effusions unchanged. Negative for edema or pneumonia. Electronically Signed   By: Franchot Gallo M.D.   On: 05/25/2018 17:34    Pending Labs Unresulted Labs (From admission, onward)    Start     Ordered   05/26/18 4403  Basic metabolic panel  Daily,   R     05/25/18 2042   05/25/18 2300   Troponin I - Once-Timed  Once-Timed,   R     05/25/18 2042   05/25/18 2300  Hemoglobin and hematocrit, blood  Now then every 8 hours,   R     05/25/18 2043   05/25/18 2042  Type and screen [place]  Once,   R    Comments:  [place]    05/25/18 2042          Vitals/Pain Today's Vitals   05/25/18 1900 05/25/18 1930 05/25/18 2000 05/25/18 2030  BP: 110/64 94/60 123/70 109/67  Pulse: 61 64 70 (!) 59  Resp: 17 19 (!) 25 20  Temp:      TempSrc:      SpO2: 97% 93% 95% 100%  Weight:      Height:        Isolation Precautions No active isolations  Medications Medications  pantoprazole (PROTONIX) 80 mg in sodium chloride 0.9 % 100 mL IVPB (has no administration in time range)  albuterol (PROVENTIL) (2.5 MG/3ML) 0.083% nebulizer solution 2.5 mg (has no administration in time range)  simvastatin (ZOCOR) tablet 20 mg (has no administration in time range)  sodium chloride flush (NS) 0.9 % injection 3 mL (has no administration in time range)  sodium chloride flush (NS) 0.9 % injection 3 mL (has no administration in time range)  0.9 %  sodium chloride infusion (has no administration in time range)  acetaminophen (TYLENOL) tablet 650 mg (has no administration in time range)  ondansetron (ZOFRAN) injection 4 mg (has no administration in time range)  pantoprazole (PROTONIX) injection 40 mg (has no administration in time range)    Mobility walks Low fall risk   Focused Assessments Cardiac Assessment Handoff:    Lab Results  Component Value Date   CKTOTAL 117 11/20/2011  CKMB 2.2 11/20/2011   TROPONINI 0.04 (HH) 05/25/2018   No results found for: DDIMER Does the Patient currently have chest pain? No    , Pulmonary Assessment Handoff:  Lung sounds:            R Recommendations: See Admitting Provider Note  Report given to:  Raven, RN  Additional Notes:  Pt is deaf as is his wife. Interpreter to accompany patient up to  floor

## 2018-05-25 NOTE — ED Notes (Signed)
Pt. Is deaf, interpreter is on the way.

## 2018-05-25 NOTE — H&P (Signed)
History and Physical    Thomas [name] [id number] DOB: 1935/03/08 DOA: 05/25/2018  PCP: [name], MD   Patient coming from: Home   Chief Complaint: Fatigue, malaise, increased SOB   HPI: Thomas [name] is a 84 y.o. male with medical history significant for deafness, CAD status post CABG, chronic systolic CHF with AICD, chronic kidney disease stage III, hypertension, and paroxysmal atrial fibrillation on Eliquis, now presenting to the emergency department for evaluation of fatigue, malaise, and increased dyspnea.  Patient reports insidious worsening in his chronic dyspnea over the past several days, has gained a few pounds over this interval, and reports that his chronic bilateral lower extremity edema has been fluctuating some.  He denies any chest pain, has had a mild cough, denies fevers or chills, and denies any abdominal pain or indigestion.  He has not noted any melena or hematochezia.  He denies any vomiting.  He was seen by his cardiologist earlier today and he was sent to the ED for further evaluation of his complaints.  ED Course: Upon arrival to the ED, patient is found to be afebrile, saturating well on room air, slightly tachypneic, and with blood pressure 94/60.  EKG features atrial fibrillation with ventricular pacing and chest x-ray is notable for chronic small pleural effusions without change.  Chemistry panel is notable for albumin of 2.5 and total protein of 4.9.  CBC features a normocytic anemia with hemoglobin of 8.4, down from 10.2 in December.  Troponin is slightly elevated at 0.04 and BNP is elevated to 293.  DRE reveals brown stool that is FOBT positive.  Patient was given 80 mg IV Protonix in the ED.  He remains hemodynamically stable and will be admitted for ongoing evaluation and management of occult GI bleeding.  Review of Systems:  All other systems reviewed and apart from HPI, are negative.  Past Medical History:  Diagnosis Date  . AICD (automatic  cardioverter/defibrillator) present   . Biventricular ICD (implantable cardioverter-defibrillator) in place 03/24/2005   Implantation of a Medtronic Adapta ADDRO1, serial number [id number]  . CHF (congestive heart failure) ([place])   . CKD (chronic kidney disease), stage III ([place])   . Coronary artery disease    a. s/p CABG 1986. b. Multiple PCIs/caths. c. 09/2013: s/p PTCA and BMS to SVG-OM.  [name]   . Dysrhythmia   . History of abdominal aortic aneurysm   . History of bleeding peptic ulcer 1980  . History of epididymitis 2013  . HTN (hypertension)   . Hydronephrosis with ureteropelvic junction obstruction   . Hydroureter on left 2009  . Hypertension   . Ischemic cardiomyopathy    a. Prior EF 30-35%, s/p BIV-ICD. b. 09/2013: EF 45-50%.  . Moderate tricuspid regurgitation   . PAF (paroxysmal atrial fibrillation) ([place])   . Presence of permanent cardiac pacemaker   . Prostate cancer ([place])   . Status post coronary artery bypass grafting 1986   LIMA to the LAD, SVG to OM, SVG to RCA  . Testicular swelling     Past Surgical History:  Procedure Laterality Date  . 2-D echocardiogram  11/20/2011   Ejection fraction 30-35% moderate concentric left ventricular hypertrophy. Left atrium is moderately dilated. Mild MR. Mild or  . BI-VENTRICULAR IMPLANTABLE CARDIOVERTER DEFIBRILLATOR N/A 12/16/2012   Procedure: BI-VENTRICULAR IMPLANTABLE CARDIOVERTER DEFIBRILLATOR  (CRT-D);  Surgeon: [name], MD;  Location: [facility] CATH LAB;  Service: Cardiovascular;  Laterality: N/A;  . CARDIAC CATHETERIZATION  12/10/2011   SVG to OM widely  patent.  LIMA to LAD patent  . CATARACT EXTRACTION W/PHACO Right 10/12/2017   Procedure: CATARACT EXTRACTION PHACO AND INTRAOCULAR LENS PLACEMENT (IOC);  Surgeon: Birder Robson, MD;  Location: [facility] ORS;  Service: Ophthalmology;  Laterality: Right;  [place] 00:57 AP% 15.9 CDE 9.07 Fluid pack lot # [id number]  . CORONARY ARTERY BYPASS GRAFT  1986  . INSERT / REPLACE / REMOVE  PACEMAKER    . LEFT HEART CATHETERIZATION WITH CORONARY/GRAFT ANGIOGRAM N/A 12/10/2011   Procedure: LEFT HEART CATHETERIZATION WITH Beatrix Fetters;  Surgeon: Sanda Klein, MD;  Location: [facility] CATH LAB;  Service: Cardiovascular;  Laterality: N/A;  . LEFT HEART CATHETERIZATION WITH CORONARY/GRAFT ANGIOGRAM N/A 09/25/2013   Procedure: LEFT HEART CATHETERIZATION WITH Beatrix Fetters;  Surgeon: Blane Ohara, MD;  Location: [facility] CATH LAB;  Service: Cardiovascular;  Laterality: N/A;  . Persantine Myoview  05/06/2010   Post-rest ejection fraction 30%. No significant ischemia demonstrated. Compared to previous study there is no significant change.  . TRANSURETHRAL RESECTION OF PROSTATE     s/p     reports that he quit smoking about 33 years ago. He has never used smokeless tobacco. He reports that he does not drink alcohol or use drugs.  Allergies  Allergen Reactions  . Phenazopyridine Nausea Only and Other (See Comments)    GI UPSET  . Ramipril Other (See Comments)    unk    Family History  Problem Relation Age of Onset  . Hypertension Father      Prior to Admission medications   Medication Sig Start Date End Date Taking? Authorizing Provider  albuterol (PROVENTIL HFA;VENTOLIN HFA) 108 (90 Base) MCG/ACT inhaler Inhale 2 puffs into the lungs 4 (four) times daily as needed.  10/30/16  Yes [provider]  carvedilol (COREG) 12.5 MG tablet Take 12.5 mg by mouth 2 (two) times daily with a meal.   Yes [provider]  ELIQUIS 5 MG TABS tablet TAKE 1 TABLET (5 MG TOTAL) BY MOUTH 2 (TWO) TIMES DAILY. Patient taking differently: Take 5 mg by mouth 2 (two) times daily.  10/05/17  Yes Croitoru, Mihai, MD  furosemide (LASIX) 80 MG tablet Take 1 tablet (80 mg total) by mouth daily. Patient taking differently: Take 80 mg by mouth daily as needed for fluid.  03/28/18  Yes Lendon Colonel, NP  HYDROCORTISONE, TOPICAL, 2 % LOTN Apply 40 oz topically 2 (two) times  daily. APPLY BID TO AFFECTED AREA 04/25/18  Yes Lendon Colonel, NP  isosorbide mononitrate (IMDUR) 60 MG 24 hr tablet Take 1.5 tablets by mouth daily Patient taking differently: Take 90 mg by mouth daily. Take 1.5 tablets by mouth daily 04/27/18  Yes Croitoru, Mihai, MD  losartan-hydrochlorothiazide (HYZAAR) 50-12.5 MG tablet TAKE 1 TABLET BY MOUTH EVERY DAY **NEED OFFICE VISIT** Patient taking differently: Take 1 tablet by mouth daily.  07/02/16  Yes [name], MD  potassium chloride (K-DUR) 10 MEQ tablet Take 3 tablets (30 mEq total) by mouth daily. Patient taking differently: Take 20 mEq by mouth daily.  11/30/16  Yes Croitoru, Mihai, MD  simvastatin (ZOCOR) 20 MG tablet TAKE 1 TABLET BY MOUTH DAILY Patient taking differently: Take 20 mg by mouth daily.  04/11/18  Yes Croitoru, Mihai, MD  vitamin B-12 (CYANOCOBALAMIN) 500 MCG tablet Take 500 mcg by mouth daily.   Yes [provider]  mirabegron ER (MYRBETRIQ) 50 MG TB24 tablet Take 1 tablet (50 mg total) by mouth daily. Patient not taking: Reported on 05/25/2018 12/13/17   Bjorn Loser,  MD    Physical Exam: Vitals:   05/25/18 1900 05/25/18 1930 05/25/18 2000 05/25/18 2030  BP: 110/64 94/60 123/70 109/67  Pulse: 61 64 70 (!) 59  Resp: 17 19 (!) 25 20  Temp:      TempSrc:      SpO2: 97% 93% 95% 100%  Weight:      Height:        Constitutional: NAD, calm  Eyes: PERTLA, lids and conjunctivae normal ENMT: Mucous membranes are moist. Posterior pharynx clear of any exudate or lesions.   Neck: normal, supple, no masses, no thyromegaly Respiratory: clear to auscultation bilaterally, no wheezing, no crackles. Normal respiratory effort.    Cardiovascular: S1 & S2 heard, regular rate and rhythm. Pretibial pitting edema bilaterally. Abdomen: No distension, no tenderness, soft. Bowel sounds normal.  Musculoskeletal: no clubbing / cyanosis. No joint deformity upper and lower extremities.   Skin: Superficial excoriations to  lower left leg. Warm, dry, well-perfused. Neurologic: No facial deficit. Deaf. Sensation intact. Moving all extremities.  Psychiatric: Alert and oriented to person, place, and situation. Pleasant and cooperative.    Labs on Admission: I have personally reviewed following labs and imaging studies  CBC: Recent Labs  Lab 05/25/18 1651  WBC 5.4  NEUTROABS 3.6  HGB 8.4*  HCT 27.8*  MCV 83.5  PLT 419   Basic Metabolic Panel: Recent Labs  Lab 05/25/18 1651  NA 140  K 4.0  CL 111  CO2 24  GLUCOSE 89  BUN 24*  CREATININE 1.23  CALCIUM 8.5*   GFR: Estimated Creatinine Clearance: 61.6 mL/min (by C-G formula based on SCr of 1.23 mg/dL). Liver Function Tests: Recent Labs  Lab 05/25/18 1651  AST 26  ALT 16  ALKPHOS 95  BILITOT 0.7  PROT 4.9*  ALBUMIN 2.5*   No results for input(s): LIPASE, AMYLASE in the last 168 hours. No results for input(s): AMMONIA in the last 168 hours. Coagulation Profile: No results for input(s): INR, PROTIME in the last 168 hours. Cardiac Enzymes: Recent Labs  Lab 05/25/18 1651  TROPONINI 0.04*   BNP (last 3 results) No results for input(s): PROBNP in the last 8760 hours. HbA1C: No results for input(s): HGBA1C in the last 72 hours. CBG: No results for input(s): GLUCAP in the last 168 hours. Lipid Profile: No results for input(s): CHOL, HDL, LDLCALC, TRIG, CHOLHDL, LDLDIRECT in the last 72 hours. Thyroid Function Tests: No results for input(s): TSH, T4TOTAL, FREET4, T3FREE, THYROIDAB in the last 72 hours. Anemia Panel: No results for input(s): VITAMINB12, FOLATE, FERRITIN, TIBC, IRON, RETICCTPCT in the last 72 hours. Urine analysis:    Component Value Date/Time   COLORURINE YELLOW (A) 07/15/2016 0557   APPEARANCEUR Clear 12/13/2017 0918   LABSPEC 1.019 07/15/2016 0557   LABSPEC 1.021 11/19/2011 1501   PHURINE 5.0 07/15/2016 0557   GLUCOSEU Negative 12/13/2017 0918   GLUCOSEU Negative 11/19/2011 1501   HGBUR SMALL (A) 07/15/2016  0557   BILIRUBINUR Negative 12/13/2017 0918   BILIRUBINUR Negative 11/19/2011 1501   KETONESUR NEGATIVE 07/15/2016 0557   PROTEINUR Negative 12/13/2017 0918   PROTEINUR NEGATIVE 07/15/2016 0557   NITRITE Negative 12/13/2017 0918   NITRITE NEGATIVE 07/15/2016 0557   LEUKOCYTESUR Negative 12/13/2017 0918   LEUKOCYTESUR Negative 11/19/2011 1501   Sepsis Labs: @LABRCNTIP (procalcitonin:4,lacticidven:4) )No results found for this or any previous visit (from the past 240 hour(s)).   Radiological Exams on Admission: Dg Chest 2 View  Result Date: 05/25/2018 CLINICAL DATA:  Short of breath productive cough COMPARISON:  03/14/2018 FINDINGS: CABG.  AICD.  Cardiac enlargement. Small pleural effusions unchanged from the prior study. Negative for edema or pneumonia. IMPRESSION: Chronic small pleural effusions unchanged. Negative for edema or pneumonia. Electronically Signed   By: Franchot Gallo M.D.   On: 05/25/2018 17:34    EKG: Independently reviewed. Atrial fibrillation, ventricular pacing.   Assessment/Plan   1. Occult GI bleeding; anemia  - Presents with fatigue, malaise, and increase in chronic dyspnea   - Found to have 2 g drop in Hgb from December and FOBT is positive  - Family reports that he has hx of PUD  - He denies abd pain, vomiting, or diarrhea, and has not seen any melena or hematochezia  - Treated with 80 mg IV Protonix in ED  - Type and screen, continue Protonix 40 mg IV q12h, trend H&H, hold Eliquis   2. Chronic systolic CHF  - Patient reports recent wt gain, mild increase in chronic dyspnea, and fluctuating b/l leg swelling  - SLIV, keep NPO for now in light of possible UGIB, continue PO Lasix, Coreg, and ARB as tolerated, give extra Lasix if requires transfusion, and follow daily wt and I/O's   3. Atrial fibrillation  - Rate-controlled on admission  - CHADS-VASc is at least 68 (age x2, CHF, HTN, CAD)  - Hold Eliquis while evaluating occult GIB   4. Hypertension  - BP  at goal  - Continue Coreg and losartan-HCTZ as tolerated   5. CAD; elevated troponin  - No recent chest pain  - Troponin 0.04 in ED  - Continue cardiac monitoring, trend troponin, continue statin, beta-blocker, nitrates, and ARB    DVT prophylaxis: Eliquis held on admission; SCD's  Code Status: Full  Family Communication: Discussed with patient  Consults called: None  Admission status: observation     Vianne Bulls, MD [facility] Pager [phone]  If 7PM-7AM, please contact night-coverage [email or web address] Password [facility]  05/25/2018, 8:44 PM

## 2018-05-25 NOTE — Patient Instructions (Signed)
Transport to [facility] ER for decompensated CHF -vs- Pneumonia

## 2018-05-25 NOTE — ED Notes (Signed)
Pt transported to xray 

## 2018-05-26 DIAGNOSIS — I959 Hypotension, unspecified: Secondary | ICD-10-CM | POA: Diagnosis present

## 2018-05-26 DIAGNOSIS — R195 Other fecal abnormalities: Secondary | ICD-10-CM

## 2018-05-26 DIAGNOSIS — N179 Acute kidney failure, unspecified: Secondary | ICD-10-CM | POA: Diagnosis present

## 2018-05-26 DIAGNOSIS — I130 Hypertensive heart and chronic kidney disease with heart failure and stage 1 through stage 4 chronic kidney disease, or unspecified chronic kidney disease: Secondary | ICD-10-CM | POA: Diagnosis present

## 2018-05-26 DIAGNOSIS — H9190 Unspecified hearing loss, unspecified ear: Secondary | ICD-10-CM | POA: Diagnosis present

## 2018-05-26 DIAGNOSIS — I4821 Permanent atrial fibrillation: Secondary | ICD-10-CM

## 2018-05-26 DIAGNOSIS — I255 Ischemic cardiomyopathy: Secondary | ICD-10-CM | POA: Diagnosis present

## 2018-05-26 DIAGNOSIS — I071 Rheumatic tricuspid insufficiency: Secondary | ICD-10-CM | POA: Diagnosis present

## 2018-05-26 DIAGNOSIS — N183 Chronic kidney disease, stage 3 (moderate): Secondary | ICD-10-CM | POA: Diagnosis present

## 2018-05-26 DIAGNOSIS — Z951 Presence of aortocoronary bypass graft: Secondary | ICD-10-CM | POA: Diagnosis not present

## 2018-05-26 DIAGNOSIS — D500 Iron deficiency anemia secondary to blood loss (chronic): Secondary | ICD-10-CM | POA: Diagnosis present

## 2018-05-26 DIAGNOSIS — I5043 Acute on chronic combined systolic (congestive) and diastolic (congestive) heart failure: Secondary | ICD-10-CM | POA: Diagnosis present

## 2018-05-26 DIAGNOSIS — Z79891 Long term (current) use of opiate analgesic: Secondary | ICD-10-CM | POA: Diagnosis not present

## 2018-05-26 DIAGNOSIS — Z7901 Long term (current) use of anticoagulants: Secondary | ICD-10-CM | POA: Diagnosis not present

## 2018-05-26 DIAGNOSIS — K922 Gastrointestinal hemorrhage, unspecified: Secondary | ICD-10-CM | POA: Diagnosis present

## 2018-05-26 DIAGNOSIS — Z8673 Personal history of transient ischemic attack (TIA), and cerebral infarction without residual deficits: Secondary | ICD-10-CM | POA: Diagnosis not present

## 2018-05-26 DIAGNOSIS — R7989 Other specified abnormal findings of blood chemistry: Secondary | ICD-10-CM | POA: Diagnosis not present

## 2018-05-26 DIAGNOSIS — Z8249 Family history of ischemic heart disease and other diseases of the circulatory system: Secondary | ICD-10-CM | POA: Diagnosis not present

## 2018-05-26 DIAGNOSIS — I10 Essential (primary) hypertension: Secondary | ICD-10-CM

## 2018-05-26 DIAGNOSIS — K3189 Other diseases of stomach and duodenum: Secondary | ICD-10-CM | POA: Diagnosis present

## 2018-05-26 DIAGNOSIS — K449 Diaphragmatic hernia without obstruction or gangrene: Secondary | ICD-10-CM | POA: Diagnosis present

## 2018-05-26 DIAGNOSIS — I5022 Chronic systolic (congestive) heart failure: Secondary | ICD-10-CM

## 2018-05-26 DIAGNOSIS — K7460 Unspecified cirrhosis of liver: Secondary | ICD-10-CM | POA: Diagnosis present

## 2018-05-26 DIAGNOSIS — Z8546 Personal history of malignant neoplasm of prostate: Secondary | ICD-10-CM | POA: Diagnosis not present

## 2018-05-26 DIAGNOSIS — Z95810 Presence of automatic (implantable) cardiac defibrillator: Secondary | ICD-10-CM | POA: Diagnosis not present

## 2018-05-26 DIAGNOSIS — Z79899 Other long term (current) drug therapy: Secondary | ICD-10-CM | POA: Diagnosis not present

## 2018-05-26 DIAGNOSIS — I2510 Atherosclerotic heart disease of native coronary artery without angina pectoris: Secondary | ICD-10-CM | POA: Diagnosis present

## 2018-05-26 DIAGNOSIS — Z8711 Personal history of peptic ulcer disease: Secondary | ICD-10-CM | POA: Diagnosis not present

## 2018-05-26 LAB — BASIC METABOLIC PANEL
Anion gap: 6 (ref 5–15)
BUN: 23 mg/dL (ref 8–23)
CO2: 23 mmol/L (ref 22–32)
Calcium: 8.1 mg/dL — ABNORMAL LOW (ref 8.9–10.3)
Chloride: 111 mmol/L (ref 98–111)
Creatinine, Ser: 1.28 mg/dL — ABNORMAL HIGH (ref 0.61–1.24)
GFR calc Af Amer: >60 mL/min (ref >60)
GFR calc non Af Amer: 52 mL/min — ABNORMAL LOW (ref >60)
Glucose, Bld: 98 mg/dL (ref 70–99)
POTASSIUM: 4 mmol/L (ref 3.5–5.1)
Sodium: 140 mmol/L (ref 135–145)

## 2018-05-26 LAB — HEMOGLOBIN AND HEMATOCRIT, BLOOD
HCT: 26.7 % — ABNORMAL LOW (ref 39.0–52.0)
HCT: 26.7 % — ABNORMAL LOW (ref 39.0–52.0)
HCT: 28.5 % — ABNORMAL LOW (ref 39.0–52.0)
Hemoglobin: 8.1 g/dL — ABNORMAL LOW (ref 13.0–17.0)
Hemoglobin: 8.1 g/dL — ABNORMAL LOW (ref 13.0–17.0)
Hemoglobin: 8.7 g/dL — ABNORMAL LOW (ref 13.0–17.0)

## 2018-05-26 LAB — TROPONIN I: TROPONIN I: 0.03 ng/mL — AB (ref <0.03)

## 2018-05-26 LAB — IRON AND TIBC
Iron: 22 ug/dL — ABNORMAL LOW (ref 45–182)
Saturation Ratios: 6 % — ABNORMAL LOW (ref 17.9–39.5)
TIBC: 356 ug/dL (ref 250–450)
UIBC: 334 ug/dL

## 2018-05-26 LAB — FOLATE: Folate: 11.5 ng/mL (ref >5.9)

## 2018-05-26 LAB — MAGNESIUM: Magnesium: 2 mg/dL (ref 1.7–2.4)

## 2018-05-26 LAB — TYPE AND SCREEN
ABO/RH(D): O POS
Antibody Screen: NEGATIVE

## 2018-05-26 LAB — ABO/RH: ABO/RH(D): O POS

## 2018-05-26 LAB — MRSA PCR SCREENING: MRSA by PCR: NEGATIVE

## 2018-05-26 LAB — VITAMIN B12: Vitamin B-12: 556 pg/mL (ref 180–914)

## 2018-05-26 LAB — FERRITIN: FERRITIN: 21 ng/mL — AB (ref 24–336)

## 2018-05-26 MED ORDER — CARVEDILOL 6.25 MG PO TABS
6.2500 mg | ORAL_TABLET | Freq: Two times a day (BID) | ORAL | Status: DC
  Administered 2018-05-27 – 2018-05-28 (×3): 6.25 mg via ORAL
  Filled 2018-05-26 (×3): qty 1

## 2018-05-26 MED ORDER — LACTATED RINGERS IV SOLN
INTRAVENOUS | Status: AC
  Administered 2018-05-26: 23:00:00 via INTRAVENOUS

## 2018-05-26 MED ORDER — CYCLOBENZAPRINE HCL 5 MG PO TABS
5.0000 mg | ORAL_TABLET | Freq: Three times a day (TID) | ORAL | Status: DC | PRN
  Filled 2018-05-26: qty 1

## 2018-05-26 MED ORDER — FUROSEMIDE 10 MG/ML IJ SOLN
80.0000 mg | Freq: Two times a day (BID) | INTRAMUSCULAR | Status: DC
  Filled 2018-05-26: qty 8

## 2018-05-26 MED ORDER — LACTATED RINGERS IV SOLN: INTRAVENOUS | Status: DC

## 2018-05-26 MED ORDER — FUROSEMIDE 80 MG PO TABS: 80.0000 mg | ORAL_TABLET | Freq: Every day | ORAL | Status: DC

## 2018-05-26 MED ORDER — SODIUM CHLORIDE 0.9 % IV SOLN
INTRAVENOUS | Status: DC
  Administered 2018-05-26: 21:00:00 via INTRAVENOUS

## 2018-05-26 NOTE — Consult Note (Signed)
Referring Provider:  Lamar Primary Care Physician:  [name], MD Primary Gastroenterologist:   Reason for Consultation: GI bleed  HPI: Thomas [name] is a 84 y.o. male deafness, CAD s/p CABG and multiple PCIs, ICM/chronic combined CHF, lymphedema, BiV-ICD 2015, CKD, history of atrial fibrillation  on Eliquis currently admitted to the hospital with dyspnea on exertion and shortness of breath.  He was found to have a drop in hemoglobin.  Occult blood positive.  GI is consulted for further evaluation.  History obtained with help of sign language interpreter. Patient is denying any abdominal pain, nausea or vomiting. He has been having intermittent dark stools but denies any black tarry stools and bright red blood per rectum. Denies any diarrhea or constipation. Denies dysphagia odynophagia.  He had colonoscopy 10 years ago which was normal.  Past Medical History:  Diagnosis Date  . AICD (automatic cardioverter/defibrillator) present   . Biventricular ICD (implantable cardioverter-defibrillator) in place 03/24/2005   Implantation of a Medtronic Adapta ADDRO1, serial number [id number]  . CHF (congestive heart failure) ([place])   . CKD (chronic kidney disease), stage III ([place])   . Coronary artery disease    a. s/p CABG 1986. b. Multiple PCIs/caths. c. 09/2013: s/p PTCA and BMS to SVG-OM.  [name]   . Dysrhythmia   . History of abdominal aortic aneurysm   . History of bleeding peptic ulcer 1980  . History of epididymitis 2013  . HTN (hypertension)   . Hydronephrosis with ureteropelvic junction obstruction   . Hydroureter on left 2009  . Hypertension   . Ischemic cardiomyopathy    a. Prior EF 30-35%, s/p BIV-ICD. b. 09/2013: EF 45-50%.  . Moderate tricuspid regurgitation   . PAF (paroxysmal atrial fibrillation) ([place])   . Presence of permanent cardiac pacemaker   . Prostate cancer ([place])   . Status post coronary artery bypass grafting 1986   LIMA to the LAD, SVG to OM, SVG to RCA  .  Testicular swelling     Past Surgical History:  Procedure Laterality Date  . 2-D echocardiogram  11/20/2011   Ejection fraction 30-35% moderate concentric left ventricular hypertrophy. Left atrium is moderately dilated. Mild MR. Mild or  . BI-VENTRICULAR IMPLANTABLE CARDIOVERTER DEFIBRILLATOR N/A 12/16/2012   Procedure: BI-VENTRICULAR IMPLANTABLE CARDIOVERTER DEFIBRILLATOR  (CRT-D);  Surgeon: [name], MD;  Location: [facility] CATH LAB;  Service: Cardiovascular;  Laterality: N/A;  . CARDIAC CATHETERIZATION  12/10/2011   SVG to OM widely patent.  LIMA to LAD patent  . CATARACT EXTRACTION W/PHACO Right 10/12/2017   Procedure: CATARACT EXTRACTION PHACO AND INTRAOCULAR LENS PLACEMENT (IOC);  Surgeon: Birder Robson, MD;  Location: [facility] ORS;  Service: Ophthalmology;  Laterality: Right;  [place] 00:57 AP% 15.9 CDE 9.07 Fluid pack lot # [id number]  . CORONARY ARTERY BYPASS GRAFT  1986  . INSERT / REPLACE / REMOVE PACEMAKER    . LEFT HEART CATHETERIZATION WITH CORONARY/GRAFT ANGIOGRAM N/A 12/10/2011   Procedure: LEFT HEART CATHETERIZATION WITH Beatrix Fetters;  Surgeon: Sanda Klein, MD;  Location: [facility] CATH LAB;  Service: Cardiovascular;  Laterality: N/A;  . LEFT HEART CATHETERIZATION WITH CORONARY/GRAFT ANGIOGRAM N/A 09/25/2013   Procedure: LEFT HEART CATHETERIZATION WITH Beatrix Fetters;  Surgeon: Blane Ohara, MD;  Location: [facility] CATH LAB;  Service: Cardiovascular;  Laterality: N/A;  . Persantine Myoview  05/06/2010   Post-rest ejection fraction 30%. No significant ischemia demonstrated. Compared to previous study there is no significant change.  . TRANSURETHRAL RESECTION OF PROSTATE     s/p  Prior to Admission medications   Medication Sig Start Date End Date Taking? Authorizing Provider  albuterol (PROVENTIL HFA;VENTOLIN HFA) 108 (90 Base) MCG/ACT inhaler Inhale 2 puffs into the lungs 4 (four) times daily as needed.  10/30/16  Yes [provider]  carvedilol (COREG)  12.5 MG tablet Take 12.5 mg by mouth 2 (two) times daily with a meal.   Yes [provider]  ELIQUIS 5 MG TABS tablet TAKE 1 TABLET (5 MG TOTAL) BY MOUTH 2 (TWO) TIMES DAILY. Patient taking differently: Take 5 mg by mouth 2 (two) times daily.  10/05/17  Yes Croitoru, Mihai, MD  furosemide (LASIX) 80 MG tablet Take 1 tablet (80 mg total) by mouth daily. Patient taking differently: Take 80 mg by mouth daily as needed for fluid.  03/28/18  Yes Lendon Colonel, NP  HYDROCORTISONE, TOPICAL, 2 % LOTN Apply 40 oz topically 2 (two) times daily. APPLY BID TO AFFECTED AREA 04/25/18  Yes Lendon Colonel, NP  isosorbide mononitrate (IMDUR) 60 MG 24 hr tablet Take 1.5 tablets by mouth daily Patient taking differently: Take 90 mg by mouth daily. Take 1.5 tablets by mouth daily 04/27/18  Yes Croitoru, Mihai, MD  losartan-hydrochlorothiazide (HYZAAR) 50-12.5 MG tablet TAKE 1 TABLET BY MOUTH EVERY DAY **NEED OFFICE VISIT** Patient taking differently: Take 1 tablet by mouth daily.  07/02/16  Yes [name], MD  potassium chloride (K-DUR) 10 MEQ tablet Take 3 tablets (30 mEq total) by mouth daily. Patient taking differently: Take 20 mEq by mouth daily.  11/30/16  Yes Croitoru, Mihai, MD  simvastatin (ZOCOR) 20 MG tablet TAKE 1 TABLET BY MOUTH DAILY Patient taking differently: Take 20 mg by mouth daily.  04/11/18  Yes Croitoru, Mihai, MD  vitamin B-12 (CYANOCOBALAMIN) 500 MCG tablet Take 500 mcg by mouth daily.   Yes [provider]  mirabegron ER (MYRBETRIQ) 50 MG TB24 tablet Take 1 tablet (50 mg total) by mouth daily. Patient not taking: Reported on 05/25/2018 12/13/17   Bjorn Loser, MD    Scheduled Meds: . carvedilol  12.5 mg Oral BID WC  . furosemide  80 mg Intravenous BID  . isosorbide mononitrate  90 mg Oral Daily  . losartan  50 mg Oral Daily  . pantoprazole (PROTONIX) IV  40 mg Intravenous Q12H  . simvastatin  20 mg Oral q1800  . sodium chloride flush  3 mL Intravenous Q12H    Continuous Infusions: . sodium chloride     PRN Meds:.sodium chloride, acetaminophen, albuterol, Influenza vac split quadrivalent PF, ondansetron (ZOFRAN) IV, sodium chloride flush  Allergies as of 05/25/2018 - Review Complete 05/25/2018  Allergen Reaction Noted  . Phenazopyridine Nausea Only and Other (See Comments) 10/05/2013  . Ramipril Other (See Comments) 02/20/2015    Family History  Problem Relation Age of Onset  . Hypertension Father     Social History   Socioeconomic History  . Marital status: Married    Spouse name: Not on file  . Number of children: Not on file  . Years of education: Not on file  . Highest education level: Not on file  Occupational History  . Not on file  Social Needs  . Financial resource strain: Not on file  . Food insecurity:    Worry: Not on file    Inability: Not on file  . Transportation needs:    Medical: Not on file    Non-medical: Not on file  Tobacco Use  . Smoking status: Former Smoker    Last attempt  to quit: 03/15/1985    Years since quitting: 33.2  . Smokeless tobacco: Never Used  Substance and Sexual Activity  . Alcohol use: No    Comment: occas.  . Drug use: No  . Sexual activity: Yes  Lifestyle  . Physical activity:    Days per week: Not on file    Minutes per session: Not on file  . Stress: Not on file  Relationships  . Social connections:    Talks on phone: Not on file    Gets together: Not on file    Attends religious service: Not on file    Active member of club or organization: Not on file    Attends meetings of clubs or organizations: Not on file    Relationship status: Not on file  . Intimate partner violence:    Fear of current or ex partner: Not on file    Emotionally abused: Not on file    Physically abused: Not on file    Forced sexual activity: Not on file  Other Topics Concern  . Not on file  Social History Narrative  . Not on file    Review of Systems: GI ROS per HPI  Physical  Exam: Vital signs: Vitals:   05/26/18 0904 05/26/18 1200  BP: 126/72 118/69  Pulse: 75 67  Resp:    Temp:    SpO2: 97% 93%   Last BM Date: 05/25/18 General:   Alert,  Well-developed, well-nourished, pleasant and cooperative in NAD Lungs:  Clear throughout to auscultation.   No wheezes, crackles, or rhonchi. No acute distress. Heart:  Regular rate and rhythm;  murmur Abdomen: Soft, nontender, nondistended, bowel sounds present.  No peritoneal signs LE : Edema noted  Rectal:  Deferred  GI:  Lab Results: Recent Labs    05/25/18 1651 05/25/18 2246 05/26/18 0626  WBC 5.4  --   --   HGB 8.4* 8.1* 8.1*  HCT 27.8* 26.7* 26.7*  PLT 241  --   --    BMET Recent Labs    05/25/18 1651 05/26/18 0626  NA 140 140  K 4.0 4.0  CL 111 111  CO2 24 23  GLUCOSE 89 98  BUN 24* 23  CREATININE 1.23 1.28*  CALCIUM 8.5* 8.1*   LFT Recent Labs    05/25/18 1651  PROT 4.9*  ALBUMIN 2.5*  AST 26  ALT 16  ALKPHOS 95  BILITOT 0.7   PT/INR No results for input(s): LABPROT, INR in the last 72 hours.   Studies/Results: Dg Chest 2 View  Result Date: 05/25/2018 CLINICAL DATA:  Short of breath productive cough COMPARISON:  03/14/2018 FINDINGS: CABG.  AICD.  Cardiac enlargement. Small pleural effusions unchanged from the prior study. Negative for edema or pneumonia. IMPRESSION: Chronic small pleural effusions unchanged. Negative for edema or pneumonia. Electronically Signed   By: Franchot Gallo M.D.   On: 05/25/2018 17:34    Impression/Plan: -Anemia with dark  and occult blood positive stool.  History of ulcer disease per family. -History of atrial fibrillation.  Eliquis on hold since yesterday  -Coronary artery disease - H/O CHF  Recommendations -------------------------- -Patient's hemoglobin was normal  In 2018.  In November 2019 he was found to have a hemoglobin of 10.7 which was down to 9.5 in December 2019.  Current hemoglobin is around 8.1. - discussed with nursing staff. He  has been having dark stools while in the hospital. We will schedule EGD for tomorrow. Consent obtained with help of sign language interpreter. -  Continue PPI for now. - Further plan based on endoscopy findings.    LOS: 0 days   Otis Brace  MD, FACP 05/26/2018, 12:46 PM  Contact #  [phone]

## 2018-05-26 NOTE — Progress Notes (Signed)
Pt was admitted and oriented to the unit. Pt's vitals were stable and has no complaints of pain. Pt's interpreters was at bedside and stayed to help the pt get settled in. Called for interpreter to be scheduled for tomorrow morning. Will continue to monitor.

## 2018-05-26 NOTE — Progress Notes (Addendum)
PROGRESS NOTE    Thomas [name]  GEZ:[id number] DOB: 10/06/1934 DOA: 05/25/2018 PCP: [name], MD   Brief Narrative:  Thomas [name] is a 84 y.o. male with medical history significant for deafness, CAD status post CABG, chronic systolic CHF with AICD, chronic kidney disease stage III, hypertension, and paroxysmal atrial fibrillation on Eliquis, now presenting to the emergency department for evaluation of fatigue, malaise, and increased dyspnea.  Patient reports insidious worsening in his chronic dyspnea over the past several days, has gained a few pounds over this interval, and reports that his chronic bilateral lower extremity edema has been fluctuating some.  He denies any chest pain, has had a mild cough, denies fevers or chills, and denies any abdominal pain or indigestion.  He has not noted any melena or hematochezia.  He denies any vomiting.  He was seen by his cardiologist earlier today and he was sent to the ED for further evaluation of his complaints.  Assessment & Plan:   Principal Problem:   Occult GI bleeding Active Problems:   CAD Status post coronary artery bypass grafting: 1995   Permanent atrial fibrillation (HCC)   Chronic systolic heart failure (HCC)   Benign essential HTN   Normocytic anemia   Elevated troponin  Addendum: c/o cramping, lightheadedness, and hypotension.  Will hold IV lasix and losartan and imdur.  Decrease dose of carvedilol and place holding parameters.  Gentle IVF overnight and follow.  1. Occult GI bleeding; anemia  - Hb dropped from 10.2 to 8.4 in setting of chronic anticoagulation, positive hemoccult (though brown stool) - Pt with dyspnea, fatigue, malaise as well - stable H/H today, will trend  - Family reports that he has hx of PUD  - has not seen any melena or hematochezia  - Treated with 80 mg IV Protonix in ED  - Type and screen, continue Protonix 40 mg IV q12h, trend H&H, hold Eliquis  - GI has been c/s, appreciate recs  - iron  panel, b12, folate  2. Chronic systolic CHF  - Patient reports recent wt gain, mild increase in chronic dyspnea, and fluctuating b/l leg swelling  - lasix increased to IV BID, appreciate cardiology recs recommendations (discontinued HCTZ, consider transitioning arb to entresto and spironolactone)  # Syncopal Episode: per previous notes, occurred on 05/19/18.  Had negative head CT at the time, but I don't see ED note. Interrogation of pacemaker suggested chronic afib per cardiology (no new arrhythmias).   Continue on telemetry Appreciate cards recs  3. Atrial fibrillation  - Rate-controlled on admission  - CHADS-VASc is at least 63 (age x2, CHF, HTN, CAD)  - Hold Eliquis while evaluating occult GIB   4. Hypertension  - BP at goal  - Continue Coreg and losartan - discontinue HCTZ at discharge as pt on lasix for diuresis.   5. CAD; elevated troponin  - No recent chest pain  - troponins low and flat, follow up per cardiology  - Continue cardiac monitoring, continue statin, beta-blocker, nitrates, and ARB   DVT prophylaxis: SCD Code Status: full  Family Communication: none at bedside Disposition Plan: pending GI and cards evaluation and sign off.  Requires inpatient hopsitalization given concern for GI bleed, need for IV PPI and inpatient evaluation.    Consultants:   Cardiology  GI  Procedures:   none  Antimicrobials:  Anti-infectives (From admission, onward)   None     Subjective: Sign language interpreter present at bedside. Here because legs were swelling and he was  told blood in stool Legs swelling on/off for about 1 month.   Maybe a little better this AM?  Objective: Vitals:   05/25/18 2139 05/26/18 0415 05/26/18 0904 05/26/18 1200  BP: 131/78 112/72 126/72 118/69  Pulse: 69 61 75 67  Resp: 18 18    Temp: 98 F (36.7 C) 98.1 F (36.7 C)    TempSrc: Oral Oral    SpO2: 93% 93% 97% 93%  Weight: 103.2 kg 102.8 kg    Height: 6\' 2"  (1.88 m)        Intake/Output Summary (Last 24 hours) at 05/26/2018 1244 Last data filed at 05/26/2018 1126 Gross per 24 hour  Intake 231.87 ml  Output 2025 ml  Net -1793.13 ml   Filed Weights   05/25/18 1644 05/25/18 2139 05/26/18 0415  Weight: 108 kg 103.2 kg 102.8 kg    Examination:  General exam: Appears calm and comfortable  Respiratory system: Clear to auscultation. Respiratory effort normal. Cardiovascular system: S1 & S2 heard, RRR. Gastrointestinal system: Abdomen is nondistended, soft and nontender.  Central nervous system: Alert and oriented. No focal neurological deficits.  Deaf.  Sign language interpreter used. Extremities: 1+ LEE bilaterally Skin: No rashes, lesions or ulcers Psychiatry: Judgement and insight appear normal. Mood & affect appropriate.     Data Reviewed: I have personally reviewed following labs and imaging studies  CBC: Recent Labs  Lab 05/25/18 1651 05/25/18 2246 05/26/18 0626  WBC 5.4  --   --   NEUTROABS 3.6  --   --   HGB 8.4* 8.1* 8.1*  HCT 27.8* 26.7* 26.7*  MCV 83.5  --   --   PLT 241  --   --    Basic Metabolic Panel: Recent Labs  Lab 05/25/18 1651 05/26/18 0626  NA 140 140  K 4.0 4.0  CL 111 111  CO2 24 23  GLUCOSE 89 98  BUN 24* 23  CREATININE 1.23 1.28*  CALCIUM 8.5* 8.1*   GFR: Estimated Creatinine Clearance: 57.9 mL/min (A) (by C-G formula based on SCr of 1.28 mg/dL (H)). Liver Function Tests: Recent Labs  Lab 05/25/18 1651  AST 26  ALT 16  ALKPHOS 95  BILITOT 0.7  PROT 4.9*  ALBUMIN 2.5*   No results for input(s): LIPASE, AMYLASE in the last 168 hours. No results for input(s): AMMONIA in the last 168 hours. Coagulation Profile: No results for input(s): INR, PROTIME in the last 168 hours. Cardiac Enzymes: Recent Labs  Lab 05/25/18 1651 05/25/18 2246  TROPONINI 0.04* 0.03*   BNP (last 3 results) No results for input(s): PROBNP in the last 8760 hours. HbA1C: No results for input(s): HGBA1C in the last 72 hours.  CBG: No results for input(s): GLUCAP in the last 168 hours. Lipid Profile: No results for input(s): CHOL, HDL, LDLCALC, TRIG, CHOLHDL, LDLDIRECT in the last 72 hours. Thyroid Function Tests: No results for input(s): TSH, T4TOTAL, FREET4, T3FREE, THYROIDAB in the last 72 hours. Anemia Panel: No results for input(s): VITAMINB12, FOLATE, FERRITIN, TIBC, IRON, RETICCTPCT in the last 72 hours. Sepsis Labs: No results for input(s): PROCALCITON, LATICACIDVEN in the last 168 hours.  Recent Results (from the past 240 hour(s))  MRSA PCR Screening     Status: None   Collection Time: 05/25/18 11:00 PM  Result Value Ref Range Status   MRSA by PCR NEGATIVE NEGATIVE Final    Comment:        The GeneXpert MRSA Assay (FDA approved for NASAL specimens only), is one component of a  comprehensive MRSA colonization surveillance program. It is not intended to diagnose MRSA infection nor to guide or monitor treatment for MRSA infections. Performed at [facility], [place] [address], [place], [place] [address]          Radiology Studies: Dg Chest 2 View  Result Date: 05/25/2018 CLINICAL DATA:  Short of breath productive cough COMPARISON:  03/14/2018 FINDINGS: CABG.  AICD.  Cardiac enlargement. Small pleural effusions unchanged from the prior study. Negative for edema or pneumonia. IMPRESSION: Chronic small pleural effusions unchanged. Negative for edema or pneumonia. Electronically Signed   By: Franchot Gallo M.D.   On: 05/25/2018 17:34        Scheduled Meds: . carvedilol  12.5 mg Oral BID WC  . furosemide  80 mg Intravenous BID  . isosorbide mononitrate  90 mg Oral Daily  . losartan  50 mg Oral Daily  . pantoprazole (PROTONIX) IV  40 mg Intravenous Q12H  . simvastatin  20 mg Oral q1800  . sodium chloride flush  3 mL Intravenous Q12H   Continuous Infusions: . sodium chloride       LOS: 0 days    Time spent: over 30 min    Fayrene Helper, MD [facility] Pager AMION   If 7PM-7AM, please contact night-coverage [email or web address] Password [name] 05/26/2018, 12:44 PM

## 2018-05-26 NOTE — TOC Initial Note (Signed)
Transition of Care [facility]) - Initial/Assessment Note    Patient Details  Name: Thomas [name] MRN: [id number] Date of Birth: 26-Jun-1934  [facility]) CM/SW Contact:    Sherrilyn Rist Phone Number: [phone] 05/26/2018, 4:23 PM  Clinical Narrative:                 CM talked to patient at the bedside via Interpreter / Sign Language;  PCP: [name], MD; has private insurance with [employer] with prescription drug coverage; [facility]; his wife takes him to all of his apts; No DME at the home; no needs identified at this time; CM will continue to follow for progression of care.  Expected Discharge Plan: Home/Self Care Barriers to Discharge: No Barriers Identified   Patient Goals and CMS Choice Patient states their goals for this hospitalization and ongoing recovery are:: No more bleeding      Expected Discharge Plan and Services Expected Discharge Plan: Home/Self Care Discharge Planning Services: CM Consult   Living arrangements for the past 2 months: Single Family Home                   DME Agency: NA HH Arranged: NA    Prior Living Arrangements/Services Living arrangements for the past 2 months: Single Family Home Lives with:: Spouse Patient language and need for interpreter reviewed:: Yes(Interpreter present for sign language) Do you feel safe going back to the place where you live?: Yes      Need for Family Participation in Patient Care: Yes (Comment) Care giver support system in place?: Yes (comment)      Activities of Daily Living Home Assistive Devices/Equipment: Cane (specify quad or straight), Walker (specify type) ADL Screening (condition at time of admission) Patient's cognitive ability adequate to safely complete daily activities?: Yes Is the patient deaf or have difficulty hearing?: Yes Does the patient have difficulty seeing, even when wearing glasses/contacts?: No Does the patient have difficulty concentrating,  remembering, or making decisions?: No Patient able to express need for assistance with ADLs?: Yes Does the patient have difficulty dressing or bathing?: No Independently performs ADLs?: Yes (appropriate for developmental age) Does the patient have difficulty walking or climbing stairs?: No Weakness of Legs: None Weakness of Arms/Hands: None  Permission Sought/Granted Permission sought to share information with : Case Manager Permission granted to share information with : Yes, Verbal Permission Granted  Share Information with NAME: [name] spouse           Emotional Assessment Appearance:: Well-Groomed Attitude/Demeanor/Rapport: Gracious, Self-Confident, Charismatic Affect (typically observed): Accepting Orientation: : Oriented to Self, Oriented to  Time, Oriented to Place, Oriented to Situation Alcohol / Substance Use: Not Applicable Psych Involvement: No (comment)  Admission diagnosis:  Gastrointestinal hemorrhage, unspecified gastrointestinal hemorrhage type [K92.2] Anemia, unspecified type [D64.9] Dyspnea, unspecified type [R06.00] Congestive heart failure, unspecified HF chronicity, unspecified heart failure type [facility]) [I50.9] Patient Active Problem List   Diagnosis Date Noted  . Occult GI bleeding 05/25/2018  . Normocytic anemia 05/25/2018  . Elevated troponin 05/25/2018  . Anemia   . Lymphedema 02/28/2018  . Weakness 07/15/2016  . Fatigue 07/15/2016  . CVA (cerebral infarction) 10/31/2015  . Bulbous urethral stricture 09/18/2015  . Bilateral deafness 08/19/2015  . Bilateral cataracts 08/19/2015  . Acid reflux 08/19/2015  . HLD (hyperlipidemia) 08/19/2015  . BP (high blood pressure) 08/19/2015  . Myocardial infarction ([place]) 08/19/2015  . Calculus of kidney 08/19/2015  . Artificial cardiac pacemaker 08/19/2015  . Gastroduodenal  ulcer 08/19/2015  . Dupuytren's contracture of foot 08/19/2015  . Malignant neoplasm of prostate ([place]) 08/19/2015  . Microhematuria  08/19/2015  . Coronary artery disease 02/22/2015  . Status post coronary artery bypass grafting 02/22/2015  . Benign essential HTN 04/02/2014  . Hematochezia 02/20/2014  . Moderate tricuspid regurgitation 02/20/2014  . Mobitz type II atrioventricular block 12/12/2013  . Long term current use of anticoagulant 10/18/2013  . Cardiomyopathy, ischemic: Ejection fraction 45% 11/11/2012  . CAD Status post coronary artery bypass grafting: 1995 11/11/2012  . Permanent atrial fibrillation ([place]) 11/11/2012  . Biventricular ICD Medtronic Viva single chamber October 2014 11/11/2012  . Chronic systolic heart failure ([place]) 11/11/2012   PCP:  [name], MD Pharmacy:   [id number] [place], [place] [address] [place] [address] Phone: [phone] Fax: [phone]     Social Determinants of Health (SDOH) Interventions    Readmission Risk Interventions  No flowsheet data found.

## 2018-05-26 NOTE — Care Management Obs Status (Signed)
MEDICARE OBSERVATION STATUS NOTIFICATION   Patient Details  Name: Thomas [name] MRN: [id number] Date of Birth: 16-Jul-1934   Medicare Observation Status Notification Given:  Yes    Royston Bake, RN 05/26/2018, 4:13 PM

## 2018-05-26 NOTE — Progress Notes (Addendum)
Progress Note  Patient Name: Thomas [name] Date of Encounter: 05/26/2018  Primary Cardiologist: Sanda Klein, MD  Subjective   Per discussion with nurse, patient does not like to communicate by paper or video translator but instead prefers in-patient translator. This has about an hour turnaround time for arrival so in the meantime I communicated via paper. He indicates he is feeling better, less cough. Swelling doesn't seem that impressive lying in bed. Not sure what further w/u is from GI standpoint.   Inpatient Medications    Scheduled Meds: . carvedilol  12.5 mg Oral BID WC  . furosemide  80 mg Oral Daily  . losartan  50 mg Oral Daily   Or  . hydrochlorothiazide  12.5 mg Oral Daily  . isosorbide mononitrate  90 mg Oral Daily  . pantoprazole (PROTONIX) IV  40 mg Intravenous Q12H  . simvastatin  20 mg Oral q1800  . sodium chloride flush  3 mL Intravenous Q12H   Continuous Infusions: . sodium chloride     PRN Meds: sodium chloride, acetaminophen, albuterol, Influenza vac split quadrivalent PF, ondansetron (ZOFRAN) IV, sodium chloride flush   Vital Signs    Vitals:   05/25/18 2100 05/25/18 2139 05/26/18 0415 05/26/18 0904  BP: 111/67 131/78 112/72 126/72  Pulse: (!) 54 69 61 75  Resp: (!) 21 18 18    Temp:  98 F (36.7 C) 98.1 F (36.7 C)   TempSrc:  Oral Oral   SpO2: 97% 93% 93% 97%  Weight:  103.2 kg 102.8 kg   Height:  6\' 2"  (1.88 m)      Intake/Output Summary (Last 24 hours) at 05/26/2018 1140 Last data filed at 05/26/2018 1126 Gross per 24 hour  Intake 231.87 ml  Output 2025 ml  Net -1793.13 ml   Last 3 Weights 05/26/2018 05/25/2018 05/25/2018  Weight (lbs) 226 lb 11.2 oz 227 lb 9.6 oz 238 lb  Weight (kg) 102.83 kg 103.239 kg 107.956 kg     Telemetry    V paced - Personally Reviewed  Physical Exam   GEN: No acute distress.  HEENT: Normocephalic, atraumatic, sclera non-icteric. Neck: No JVD or bruits. Cardiac: RRR no murmurs, rubs, or gallops.   Radials/DP/PT 1+ and equal bilaterally.  Respiratory: Quiet wheezing occasionally, coarse, no rales or rhonchi. Breathing is unlabored. GI: Soft, nontender, non-distended, BS +x 4. MS: no deformity. Extremities: No clubbing or cyanosis. Trace BLE edema. Distal pedal pulses are 2+ and equal bilaterally. Neuro: Deaf, understands conversation via writing Psych: Normal affect  Labs    Chemistry Recent Labs  Lab 05/25/18 1651 05/26/18 0626  NA 140 140  K 4.0 4.0  CL 111 111  CO2 24 23  GLUCOSE 89 98  BUN 24* 23  CREATININE 1.23 1.28*  CALCIUM 8.5* 8.1*  PROT 4.9*  --   ALBUMIN 2.5*  --   AST 26  --   ALT 16  --   ALKPHOS 95  --   BILITOT 0.7  --   GFRNONAA 55* 52*  GFRAA >60 >60  ANIONGAP 5 6     Hematology Recent Labs  Lab 05/25/18 1651 05/25/18 2246 05/26/18 0626  WBC 5.4  --   --   RBC 3.33*  --   --   HGB 8.4* 8.1* 8.1*  HCT 27.8* 26.7* 26.7*  MCV 83.5  --   --   MCH 25.2*  --   --   MCHC 30.2  --   --   RDW 17.4*  --   --  PLT 241  --   --     Cardiac Enzymes Recent Labs  Lab 05/25/18 1651 05/25/18 2246  TROPONINI 0.04* 0.03*   No results for input(s): TROPIPOC in the last 168 hours.   BNP Recent Labs  Lab 05/25/18 1651  BNP 292.5*     DDimer No results for input(s): DDIMER in the last 168 hours.   Radiology    Dg Chest 2 View  Result Date: 05/25/2018 CLINICAL DATA:  Short of breath productive cough COMPARISON:  03/14/2018 FINDINGS: CABG.  AICD.  Cardiac enlargement. Small pleural effusions unchanged from the prior study. Negative for edema or pneumonia. IMPRESSION: Chronic small pleural effusions unchanged. Negative for edema or pneumonia. Electronically Signed   By: Franchot Gallo M.D.   On: 05/25/2018 17:34   Patient Profile     84 y.o. 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 He presented to [facility] office yesterday reporting chest congestion, yellow sputum cough, worsening SOB and edema. He also had recent syncope and near-syncope. On arrival he was found to be progressively more anemic than previous - seems to be a slow downtrend since 2018 (13.4 -> 8.1). FOBT is positive.  Assessment & Plan    1. Worsening DOE and syncope, possibly related to symptomatic anemia with slow GIB - Hgb has dropped about 5 grams over 2 years' time without obvious knowledge to the patient. Eliquis on hold, pending further eval by internal medicine. See below re: ICD.  2. Chronic combined CHF and chronic lower extremity edema with h/o lymphedema - edema appears rather compensated lying in bed. This has been called lymphedema in the past. Low albumin is likely contributing. Consider discontinuing HCTZ since he is on Lasix and instead adding spironolactone given his low EF. Consideration could be given to transitioning AR[facility] to [facility] as OP though not sure I would pursue until anemia is under control given recent syncope and near-syncope. His weight is actually below prior baseline weights, ranging anywhere from 227-238. Addendum: Device was interrogated today given h/o syncope - no cardiac reason for this to have occurred. Per rep, device function is normal, underlying CHB, and no new arrhythmias but looks like chronic AF.  3. CKD stage III - appears at baseline.  4. Elevated troponin - low/flat trend, doubt ACS. Follow clinically. Not currently a good candidate for invasive w/u anyway given declining Hgb.  5. Persistent atrial fib- difficult to discern on EKGs, but reported to be in atrial fib in 06/2017 OV with Dr. [name] at which time no specific mention was made of  pursuing rhythm control. Device interrogations do not really specify but suspect chronic. Device interrogation also suggestive of chronic AF. Eliquis on hold by IM pending w/u of anemia.  For questions or updates, please contact [place] Please consult [email or web address] for contact info under Cardiology/STEMI.  Signed, Charlie Pitter, PA-C 05/26/2018, 11:40 AM

## 2018-05-27 ENCOUNTER — Encounter (HOSPITAL_COMMUNITY)
Admission: EM | Disposition: A | Discharge status: Home / Self Care | Payer: Self-pay | Source: Home / Self Care | Attending: Family Medicine

## 2018-05-27 ENCOUNTER — Inpatient Hospital Stay (HOSPITAL_COMMUNITY): Payer: PPO | Admitting: Anesthesiology

## 2018-05-27 ENCOUNTER — Inpatient Hospital Stay (HOSPITAL_COMMUNITY): Payer: PPO

## 2018-05-27 ENCOUNTER — Encounter (HOSPITAL_COMMUNITY): Payer: Self-pay | Admitting: *Deleted

## 2018-05-27 DIAGNOSIS — K922 Gastrointestinal hemorrhage, unspecified: Principal | ICD-10-CM

## 2018-05-27 DIAGNOSIS — I5043 Acute on chronic combined systolic (congestive) and diastolic (congestive) heart failure: Secondary | ICD-10-CM

## 2018-05-27 HISTORY — PX: ESOPHAGOGASTRODUODENOSCOPY (EGD) WITH PROPOFOL: SHX5813

## 2018-05-27 LAB — BASIC METABOLIC PANEL
Anion gap: 7 (ref 5–15)
BUN: 24 mg/dL — ABNORMAL HIGH (ref 8–23)
CO2: 25 mmol/L (ref 22–32)
Calcium: 8.4 mg/dL — ABNORMAL LOW (ref 8.9–10.3)
Chloride: 107 mmol/L (ref 98–111)
Creatinine, Ser: 1.6 mg/dL — ABNORMAL HIGH (ref 0.61–1.24)
GFR calc Af Amer: 46 mL/min — ABNORMAL LOW (ref >60)
GFR calc non Af Amer: 40 mL/min — ABNORMAL LOW (ref >60)
Glucose, Bld: 93 mg/dL (ref 70–99)
Potassium: 3.6 mmol/L (ref 3.5–5.1)
Sodium: 139 mmol/L (ref 135–145)

## 2018-05-27 LAB — CBC
HCT: 25.5 % — ABNORMAL LOW (ref 39.0–52.0)
Hemoglobin: 8 g/dL — ABNORMAL LOW (ref 13.0–17.0)
MCH: 25.1 pg — ABNORMAL LOW (ref 26.0–34.0)
MCHC: 31.4 g/dL (ref 30.0–36.0)
MCV: 79.9 fL — ABNORMAL LOW (ref 80.0–100.0)
Platelets: 213 10*3/uL (ref 150–400)
RBC: 3.19 MIL/uL — ABNORMAL LOW (ref 4.22–5.81)
RDW: 17.3 % — ABNORMAL HIGH (ref 11.5–15.5)
WBC: 5 10*3/uL (ref 4.0–10.5)
nRBC: 0 % (ref 0.0–0.2)

## 2018-05-27 SURGERY — ESOPHAGOGASTRODUODENOSCOPY (EGD) WITH PROPOFOL
Anesthesia: Monitor Anesthesia Care

## 2018-05-27 MED ORDER — BUTAMBEN-TETRACAINE-BENZOCAINE 2-2-14 % EX AERO
INHALATION_SPRAY | CUTANEOUS | Status: DC | PRN
  Administered 2018-05-27: 1 via TOPICAL

## 2018-05-27 MED ORDER — PANTOPRAZOLE SODIUM 40 MG PO TBEC
40.0000 mg | DELAYED_RELEASE_TABLET | Freq: Every day | ORAL | Status: DC
  Administered 2018-05-27 – 2018-05-28 (×2): 40 mg via ORAL
  Filled 2018-05-27 (×2): qty 1

## 2018-05-27 MED ORDER — PROPOFOL 500 MG/50ML IV EMUL
INTRAVENOUS | Status: DC | PRN
  Administered 2018-05-27: 150 ug/kg/min via INTRAVENOUS

## 2018-05-27 MED ORDER — LACTATED RINGERS IV SOLN
INTRAVENOUS | Status: DC | PRN
  Administered 2018-05-27: 13:00:00 via INTRAVENOUS

## 2018-05-27 MED ORDER — SODIUM CHLORIDE 0.9 % IV SOLN
510.0000 mg | Freq: Once | INTRAVENOUS | Status: AC
  Administered 2018-05-28: 510 mg via INTRAVENOUS
  Filled 2018-05-27 (×3): qty 17

## 2018-05-27 SURGICAL SUPPLY — 14 items

## 2018-05-27 NOTE — Progress Notes (Signed)
Patient's family called-  Still having a lot of pain/cramping in his hands.  Tylenol and heat did not help at all.  Gave Thomas [name] some mustard until I could talk to MD.   Mr. Coger started feeling dizzy at that time.   Assisted patient to bed from chair and took BP.  BP was 91/52 Held IV lasix and coreg- called MD  Dr. Florene Glen came to check on patient.  Ordered flexeril for hands, changed lasix to PO and held both lasix and coreg until tomorrow

## 2018-05-27 NOTE — Progress Notes (Signed)
DAILY PROGRESS NOTE   Patient Name: Thomas [name] Date of Encounter: 05/27/2018 Cardiologist: Sanda Klein, MD  Chief Complaint   Had cramping in his hands last night and dizziness  Patient Profile   84 y.o. 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 He presented to [facility] office yesterday reporting chest congestion, yellow sputum cough, worsening SOB and edema. He also had recent syncope and near-syncope. On arrival he was found to be progressively more anemic than previous - seems to be a slow downtrend since 2018 (13.4 -> 8.1). FOBT is positive.  Subjective   Cramping and dizziness reported yesterday - orthostatics negative. Lasix was held - creatinine jumped to 1.6 overnight. BP was low - but has improved.  Objective   Vitals:   05/27/18 0504 05/27/18 0505 05/27/18 0506 05/27/18 0509  BP: (!) 103/56 (!) 107/57 (!) 108/57 111/61  Pulse: 61 61 75 67  Resp:      Temp: 98 F (36.7 C)     TempSrc: Oral     SpO2: 95%     Weight:   99.1 kg   Height:        Intake/Output Summary (Last 24 hours) at 05/27/2018 0834 Last data filed at 05/27/2018 0700 Gross per 24 hour  Intake 1039.95 ml  Output 2400 ml  Net -1360.05 ml   Filed Weights   05/25/18 2139 05/26/18 0415 05/27/18 0506  Weight: 103.2 kg 102.8 kg 99.1 kg    Physical Exam   General appearance: alert and no distress Neck: JVD - large v waves cm above sternal notch, no carotid bruit and thyroid not enlarged, symmetric, no tenderness/mass/nodules Lungs: clear to auscultation bilaterally Heart: regular rate and rhythm Abdomen:  soft, non-tender; bowel sounds normal; no masses,  no organomegaly Extremities: edema trace bilateral Pulses: 2+ and symmetric Skin: pale, warm, dry Neurologic: Grossly normal Psych: Pleasant  Inpatient Medications    Scheduled Meds: . carvedilol  6.25 mg Oral BID WC  . pantoprazole (PROTONIX) IV  40 mg Intravenous Q12H  . simvastatin  20 mg Oral q1800  . sodium chloride flush  3 mL Intravenous Q12H    Continuous Infusions: . sodium chloride    . sodium chloride Stopped (05/26/18 2245)  . ferumoxytol    . lactated ringers 75 mL/hr at 05/26/18 2247    PRN Meds: sodium chloride, acetaminophen, albuterol, cyclobenzaprine, Influenza vac split quadrivalent PF, ondansetron (ZOFRAN) IV, sodium chloride flush   Labs   Results for orders placed or performed during the hospital encounter of 05/25/18 (from the past 48 hour(s))  Comprehensive metabolic panel     Status: Abnormal   Collection Time: 05/25/18  4:51 PM  Result Value Ref Range   Sodium 140 135 - 145 mmol/L   Potassium 4.0 3.5 - 5.1 mmol/L   Chloride 111 98 - 111 mmol/L   CO2 24 22 - 32 mmol/L   Glucose, Bld 89 70 - 99 mg/dL   BUN 24 (H) 8 - 23 mg/dL   Creatinine, Ser 1.23 0.61 - 1.24 mg/dL   Calcium 8.5 (L) 8.9 - 10.3 mg/dL   Total Protein 4.9 (L) 6.5 - 8.1 g/dL  Albumin 2.5 (L) 3.5 - 5.0 g/dL   AST 26 15 - 41 U/L   ALT 16 0 - 44 U/L   Alkaline Phosphatase 95 38 - 126 U/L   Total Bilirubin 0.7 0.3 - 1.2 mg/dL   GFR calc non Af Amer 55 (L) >60 mL/min   GFR calc Af Amer >60 >60 mL/min   Anion gap 5 5 - 15    Comment: Performed at [facility] [address], [place], [place] [address]  Brain natriuretic peptide     Status: Abnormal   Collection Time: 05/25/18  4:51 PM  Result Value Ref Range   B Natriuretic Peptide 292.5 (H) 0.0 - 100.0 pg/mL    Comment: Performed at [facility] [address], [place], [place] [address]  Troponin I - Once     Status: Abnormal   Collection Time: 05/25/18  4:51 PM   Result Value Ref Range   Troponin I 0.04 (HH) <0.03 ng/mL    Comment: CRITICAL RESULT CALLED TO, READ BACK BY AND VERIFIED WITHDossie Arbour 1742 05/25/2018 D BRADLEY Performed at [facility] [address], [place], [place] [address]   CBC with Differential     Status: Abnormal   Collection Time: 05/25/18  4:51 PM  Result Value Ref Range   WBC 5.4 4.0 - 10.5 K/uL   RBC 3.33 (L) 4.22 - 5.81 MIL/uL   Hemoglobin 8.4 (L) 13.0 - 17.0 g/dL   HCT 27.8 (L) 39.0 - 52.0 %   MCV 83.5 80.0 - 100.0 fL   MCH 25.2 (L) 26.0 - 34.0 pg   MCHC 30.2 30.0 - 36.0 g/dL   RDW 17.4 (H) 11.5 - 15.5 %   Platelets 241 150 - 400 K/uL   nRBC 0.0 0.0 - 0.2 %   Neutrophils Relative % 67 %   Neutro Abs 3.6 1.7 - 7.7 K/uL   Lymphocytes Relative 14 %   Lymphs Abs 0.8 0.7 - 4.0 K/uL   Monocytes Relative 14 %   Monocytes Absolute 0.8 0.1 - 1.0 K/uL   Eosinophils Relative 4 %   Eosinophils Absolute 0.2 0.0 - 0.5 K/uL   Basophils Relative 1 %   Basophils Absolute 0.1 0.0 - 0.1 K/uL   Immature Granulocytes 0 %   Abs Immature Granulocytes 0.02 0.00 - 0.07 K/uL    Comment: Performed at [facility] [address] [place], [place] [address]  POC occult blood, ED     Status: Abnormal   Collection Time: 05/25/18  8:08 PM  Result Value Ref Range   Fecal Occult Bld POSITIVE (A) NEGATIVE  Type and screen [place]     Status: None   Collection Time: 05/25/18 10:46 PM  Result Value Ref Range   ABO/RH(D) O POS    Antibody Screen NEG    Sample Expiration      05/28/2018 Performed at [facility], [place] [address], [place], [place] [address]   Troponin I - Once-Timed     Status: Abnormal   Collection Time: 05/25/18 10:46 PM  Result Value Ref Range   Troponin I 0.03 (HH) <0.03 ng/mL    Comment: CRITICAL VALUE NOTED.  VALUE IS CONSISTENT WITH PREVIOUSLY REPORTED AND CALLED VALUE. Performed at [facility], [place] [address], [place], [place] [address]   Hemoglobin and  hematocrit, blood     Status: Abnormal   Collection Time: 05/25/18 10:46 PM  Result Value Ref  Range   Hemoglobin 8.1 (L) 13.0 - 17.0 g/dL   HCT 26.7 (L) 39.0 - 52.0 %    Comment: Performed at [facility] [address] [place], [place] [address]  ABO/Rh     Status: None   Collection Time: 05/25/18 10:46 PM  Result Value Ref Range   ABO/RH(D)      O POS Performed at [facility] [address] [place], [place] [address]   MRSA PCR Screening     Status: None   Collection Time: 05/25/18 11:00 PM  Result Value Ref Range   MRSA by PCR NEGATIVE NEGATIVE    Comment:        The GeneXpert MRSA Assay (FDA approved for NASAL specimens only), is one component of a comprehensive MRSA colonization surveillance program. It is not intended to diagnose MRSA infection nor to guide or monitor treatment for MRSA infections. Performed at [facility], [place] [address] [place], [place] [address]   Basic metabolic panel     Status: Abnormal   Collection Time: 05/26/18  6:26 AM  Result Value Ref Range   Sodium 140 135 - 145 mmol/L   Potassium 4.0 3.5 - 5.1 mmol/L   Chloride 111 98 - 111 mmol/L   CO2 23 22 - 32 mmol/L   Glucose, Bld 98 70 - 99 mg/dL   BUN 23 8 - 23 mg/dL   Creatinine, Ser 1.28 (H) 0.61 - 1.24 mg/dL   Calcium 8.1 (L) 8.9 - 10.3 mg/dL   GFR calc non Af Amer 52 (L) >60 mL/min   GFR calc Af Amer >60 >60 mL/min   Anion gap 6 5 - 15    Comment: Performed at [facility] [address] [place], [place] [address]  Hemoglobin and hematocrit, blood     Status: Abnormal   Collection Time: 05/26/18  6:26 AM  Result Value Ref Range   Hemoglobin 8.1 (L) 13.0 - 17.0 g/dL   HCT 26.7 (L) 39.0 - 52.0 %    Comment: Performed at [facility] [address] [place], [place] [address]  Hemoglobin and hematocrit, blood     Status: Abnormal   Collection Time: 05/26/18  1:59 PM  Result Value Ref Range   Hemoglobin 8.7 (L) 13.0 - 17.0 g/dL   HCT 28.5 (L) 39.0  - 52.0 %    Comment: Performed at [facility], [place] [address], [place], [place] [address]  Iron and TIBC     Status: Abnormal   Collection Time: 05/26/18  1:59 PM  Result Value Ref Range   Iron 22 (L) 45 - 182 ug/dL   TIBC 356 250 - 450 ug/dL   Saturation Ratios 6 (L) 17.9 - 39.5 %   UIBC 334 ug/dL    Comment: Performed at [facility], [place] [address] [place], [place] [address]  Ferritin     Status: Abnormal   Collection Time: 05/26/18  1:59 PM  Result Value Ref Range   Ferritin 21 (L) 24 - 336 ng/mL    Comment: Performed at [facility], [place] [address], [place], [place] [address]  Vitamin B12     Status: None   Collection Time: 05/26/18  1:59 PM  Result Value Ref Range   Vitamin B-12 556 180 - 914 pg/mL    Comment: (NOTE) This assay is not validated for testing neonatal or myeloproliferative syndrome specimens for Vitamin B12 levels. Performed at [facility], [place] [address] [place], [place] [address]   Folate     Status: None   Collection Time: 05/26/18  1:59 PM  Result Value Ref Range   Folate 11.5 >5.9 ng/mL    Comment: Performed at [facility], [place] [address] [place], [place] [address]  Magnesium     Status: None   Collection Time: 05/26/18  8:25 PM  Result Value Ref Range   Magnesium 2.0 1.7 - 2.4 mg/dL    Comment: Performed at [facility] [address], [place], [place] [address]  Basic metabolic panel     Status: Abnormal   Collection Time: 05/27/18  4:57 AM  Result Value Ref Range   Sodium 139 135 - 145 mmol/L   Potassium 3.6 3.5 - 5.1 mmol/L   Chloride 107 98 - 111 mmol/L   CO2 25 22 - 32 mmol/L   Glucose, Bld 93 70 - 99 mg/dL   BUN 24 (H) 8 - 23 mg/dL   Creatinine, Ser 1.60 (H) 0.61 - 1.24 mg/dL   Calcium 8.4 (L) 8.9 - 10.3 mg/dL   GFR calc non Af Amer 40 (L) >60 mL/min   GFR calc Af Amer 46 (L) >60 mL/min   Anion gap 7 5 - 15    Comment: Performed at [facility] [address], [place], [place] [address]   CBC     Status: Abnormal   Collection Time: 05/27/18  4:57 AM  Result Value Ref Range   WBC 5.0 4.0 - 10.5 K/uL   RBC 3.19 (L) 4.22 - 5.81 MIL/uL   Hemoglobin 8.0 (L) 13.0 - 17.0 g/dL   HCT 25.5 (L) 39.0 - 52.0 %   MCV 79.9 (L) 80.0 - 100.0 fL   MCH 25.1 (L) 26.0 - 34.0 pg   MCHC 31.4 30.0 - 36.0 g/dL   RDW 17.3 (H) 11.5 - 15.5 %   Platelets 213 150 - 400 K/uL   nRBC 0.0 0.0 - 0.2 %    Comment: Performed at [facility] [address], [place], [place] [address]    ECG   N/A  Telemetry   Paced rhythm - Personally Reviewed  Radiology    Dg Chest 2 View  Result Date: 05/25/2018 CLINICAL DATA:  Short of breath productive cough COMPARISON:  03/14/2018 FINDINGS: CABG.  AICD.  Cardiac enlargement. Small pleural effusions unchanged from the prior study. Negative for edema or pneumonia. IMPRESSION: Chronic small pleural effusions unchanged. Negative for edema or pneumonia. Electronically Signed   By: Franchot Gallo M.D.   On: 05/25/2018 17:34    Cardiac Studies   N/A  Assessment   1. Principal Problem: 2.   Occult GI bleeding 3. Active Problems: 4.   CAD Status post coronary artery bypass grafting: 1995 5.   Permanent atrial fibrillation ([place]) 6.   Chronic systolic heart failure ([place]) 7.   Benign essential HTN 8.   Normocytic anemia 9.   Elevated troponin 10.   GI bleed 11.   Plan   1. Feels better today - 1.4L negative overnight. Had cramping and dizziness - acute rise in creatinine. Agree with holding lasix today, especially since he is NPO for EGD - probably start oral lasix tomorrow. Has large V waves, likely masquerading as JVD. Peripheral edema is almost gone.  Time Spent Directly with Patient:  I have spent a total of 25 minutes with the patient reviewing hospital notes, telemetry, EKGs, labs and examining  the patient as well as establishing an assessment and plan that was discussed personally with the patient.  > 50% of time was spent in direct patient  care.  Length of Stay:  LOS: 1 day   Pixie Casino, MD, [facility], [place] Director of the [facility]  Card[facility] Diplomate of the American Board of Clinical Lipidology Attending Cardiologist  Direct Dial: [phone]  Fax: [phone]  Website:  [email or web address] 05/27/2018, 8:34 AM

## 2018-05-27 NOTE — Progress Notes (Signed)
Thomas [name] 1:19 PM  Subjective: Patient seen and examined and discussed with he and his wife with a interpreter and has had no signs of bleeding and his case discussed with my partner Dr. Rudean Curt and his hospital computer chart was reviewed  Objective: Vital signs stable afebrile exam please see preassessment evaluation creatinine increased a little hemoglobin stable iron deficient  Assessment: GI blood loss in patient on Eliquis  Plan: Okay to proceed with endoscopy with anesthesia assistance  [facility]  Pager [phone] After 5PM or if no answer call [phone]

## 2018-05-27 NOTE — Progress Notes (Signed)
See endoscopy report but I would discharge him on 1 Protonix a day and will check a CT just to be sure and consider repeat colonoscopy either as inpatient or outpatient if needed or if signs of GI blood loss continue

## 2018-05-27 NOTE — Progress Notes (Signed)
Ortho's done:103/56, HR 70 lying                         10/57, HR 61 sitting                         108/57, HR 75 standing                          111/61, HR 67 standing for 3 min Thanks Arvella Nigh RN.

## 2018-05-27 NOTE — Evaluation (Signed)
Physical Therapy Evaluation & Discharge Patient Details Name: LOWEN BARRINGER MRN: [id number] DOB: 01-28-1935 Today's Date: 05/27/2018   History of Present Illness  Pt is an 84 y.o. male admitted 05/24/08 with c/o chest congestion, SOB and edema, also near-syncope. (-) orthostatics. Worked up for occult GIB. Plan for EGD. PMH includes CAD, afib, HF, HTN, stroke; pt is deaf.    Clinical Impression  Patient evaluated by Physical Therapy with no further acute PT needs identified. PTA, pt mod indep with SPC and lives with supportive family. Today, pt mod indep with ambulation and ADLs. All education has been completed and the patient has no further questions. Encouraged continued ambulation during hospital admission. Acute PT is signing off. Thank you for this referral.    Follow Up Recommendations No PT follow up    Equipment Recommendations  None recommended by PT    Recommendations for Other Services       Precautions / Restrictions Precautions Precautions: None Restrictions Weight Bearing Restrictions: No      Mobility  Bed Mobility Overal bed mobility: Independent                Transfers Overall transfer level: Modified independent Equipment used: Straight cane                Ambulation/Gait Ambulation/Gait assistance: Modified independent (Device/Increase time) Gait Distance (Feet): 300 Feet Assistive device: Straight cane Gait Pattern/deviations: Step-through pattern;Decreased stride length   Gait velocity interpretation: 1.31 - 2.62 ft/sec, indicative of limited community ambulator General Gait Details: Slow, steady gait, mod indep with SPC. No overt instability or LOB with higher level balance activities  Stairs            Wheelchair Mobility    Modified Rankin (Stroke Patients Only)       Balance Overall balance assessment: Needs assistance   Sitting balance-Leahy Scale: Good       Standing balance-Leahy Scale: Fair               High level balance activites: Side stepping;Backward walking;Direction changes;Turns;Sudden stops;Head turns High Level Balance Comments: No overt instability or LOB with higher level balance activities while ambulating with SPC             Pertinent Vitals/Pain Pain Assessment: No/denies pain    Home Living Family/patient expects to be discharged to:: Private residence Living Arrangements: Spouse/significant other Available Help at Discharge: Family;Available 24 hours/day Type of Home: House Home Access: Level entry     Home Layout: One level Home Equipment: Cane - single point;Walker - 2 wheels Additional Comments: Lives with wife and adult son     Prior Function Level of Independence: Independent with assistive device(s)         Comments: Mod indep with SPC. Drives. Wife travels to all appointments with him     Hand Dominance   Dominant Hand: Right    Extremity/Trunk Assessment   Upper Extremity Assessment Upper Extremity Assessment: Overall WFL for tasks assessed    Lower Extremity Assessment Lower Extremity Assessment: Overall WFL for tasks assessed    Cervical / Trunk Assessment Cervical / Trunk Assessment: Normal  Communication   Communication: Prefers language other than [place];Interpreter utilized(Pt is deaf; ASL in-person interpreter [employer]) utilized)  Cognition Arousal/Alertness: Awake/alert Behavior During Therapy: WFL for tasks assessed/performed Overall Cognitive Status: Within Functional Limits for tasks assessed  General Comments      Exercises     Assessment/Plan    PT Assessment Patent does not need any further PT services  PT Problem List         PT Treatment Interventions      PT Goals (Current goals can be found in the Care Plan section)  Acute Rehab PT Goals PT Goal Formulation: All assessment and education complete, DC therapy    Frequency     Barriers to  discharge        Co-evaluation               AM-PAC PT "6 Clicks" Mobility  Outcome Measure Help needed turning from your back to your side while in a flat bed without using bedrails?: None Help needed moving from lying on your back to sitting on the side of a flat bed without using bedrails?: None Help needed moving to and from a bed to a chair (including a wheelchair)?: None Help needed standing up from a chair using your arms (e.g., wheelchair or bedside chair)?: None Help needed to walk in hospital room?: None Help needed climbing 3-5 steps with a railing? : A Little 6 Click Score: 23    End of Session   Activity Tolerance: Patient tolerated treatment well Patient left: in chair;with call bell/phone within reach Nurse Communication: Mobility status PT Visit Diagnosis: Other abnormalities of gait and mobility (R26.89)    Time: 3832-9191 PT Time Calculation (min) (ACUTE ONLY): 22 min   Charges:   PT Evaluation $PT Eval Low Complexity: 1 Low        Mabeline Caras, PT, DPT Acute Rehabilitation Services  Pager [phone] [place] 05/27/2018, 10:43 AM

## 2018-05-27 NOTE — Op Note (Signed)
[facility] Patient Name: Thomas [name] Procedure Date : 05/27/2018 MRN: [id number] Attending MD: Clarene Essex , MD Date of Birth: 1935-03-08 CSN: [id number] Age: 84 Admit Type: Inpatient Procedure:                Upper GI endoscopy Indications:              Iron deficiency anemia secondary to chronic blood                            loss in patient on Eliquis Providers:                Clarene Essex, MD, Cleda Daub, RN, Cletis Athens,                            Technician Referring MD:              Medicines:                Propofol total dose 188 mg IV Complications:            No immediate complications. Estimated Blood Loss:     Estimated blood loss: none. Estimated blood loss:                            none. Procedure:                Pre-Anesthesia Assessment:                           - Prior to the procedure, a History and Physical                            was performed, and patient medications and                            allergies were reviewed. The patient's tolerance of                            previous anesthesia was also reviewed. The risks                            and benefits of the procedure and the sedation                            options and risks were discussed with the patient.                            All questions were answered, and informed consent                            was obtained. Prior Anticoagulants: The patient has                            taken Eliquis (apixaban), last dose was 2 days  prior to procedure. ASA Grade Assessment: III - A                            patient with severe systemic disease. After                            reviewing the risks and benefits, the patient was                            deemed in satisfactory condition to undergo the                            procedure.                           After obtaining informed consent, the endoscope was   passed under direct vision. Throughout the                            procedure, the patient's blood pressure, pulse, and                            oxygen saturations were monitored continuously. The                            GIF-H190 ([id number]) Olympus gastroscope was                            introduced through the mouth, and advanced to the                            third part of duodenum. The upper GI endoscopy was                            accomplished without difficulty. The patient                            tolerated the procedure well. Scope In: Scope Out: Findings:      The larynx was normal.      A small hiatal hernia was present.      The entire examined stomach was normal.      Patchy mildly erythematous mucosa without active bleeding and with no       stigmata of bleeding was found in the duodenal bulb, in the first       portion of the duodenum and in the second portion of the duodenum.      The third portion of the duodenum was normal.      The exam was otherwise without abnormality.      A single strand of clotted blood was found at the pylorus was easily       washed away without an obvious source although probably from the       duodenal bulb inflammation. Impression:               - Normal larynx.                           -  Small hiatal hernia.                           - Normal stomach.                           - Erythematous duodenopathy.                           - Normal third portion of the duodenum.                           - The examination was otherwise normal.                           - Clotted blood in the pylorus.                           - No specimens collected. Recommendation:           - Soft diet today.                           - Continue present medications. Reevaluate blood                            thinner needs                           - Return to GI clinic PRN.                           - Telephone GI clinic if symptomatic PRN.                            - Perform a CT scan (computed tomography) of                            abdomen without contrast and pelvis without                            contrast at appointment to be scheduled. And                            consider inpatient or outpatient colonoscopy                            pending those findings Procedure Code(s):        --- Professional ---                           [phone], Esophagogastroduodenoscopy, flexible,                            transoral; diagnostic, including collection of                            specimen(s) by brushing or washing, when performed                            (  separate procedure) Diagnosis Code(s):        --- Professional ---                           K44.9, Diaphragmatic hernia without obstruction or                            gangrene                           K31.89, Other diseases of stomach and duodenum                           K92.2, Gastrointestinal hemorrhage, unspecified                           D50.0, Iron deficiency anemia secondary to blood                            loss (chronic) CPT copyright 2018 American Medical Association. All rights reserved. The codes documented in this report are preliminary and upon coder review may  be revised to meet current compliance requirements. Clarene Essex, MD 05/27/2018 2:15:57 PM This report has been signed electronically. Number of Addenda: 0

## 2018-05-27 NOTE — Anesthesia Preprocedure Evaluation (Addendum)
Anesthesia Evaluation    Airway Mallampati: II       Dental  (+) Poor Dentition   Pulmonary former smoker,    Pulmonary exam normal breath sounds clear to auscultation       Cardiovascular hypertension, Normal cardiovascular exam Rhythm:Regular Rate:Normal     Neuro/Psych    GI/Hepatic   Endo/Other    Renal/GU      Musculoskeletal   Abdominal Normal abdominal exam  (+)   Peds  Hematology   Anesthesia Other Findings Result status: Final result   ECHOCARDIOGRAM REPORT       Patient Name:   Thomas [name] Date of Exam: 05/02/2018 Medical Rec #:  [id number]      Height:       74.0 in Accession #:    [id number]     Weight:       242.0 lb Date of Birth:  03-20-1934     BSA:          2.36 m Patient Age:    84 years       BP:           110/62 mmHg Patient Gender: M              HR:           91 bpm. Exam Location:  [facility]    Procedure: 2D Echo  Indications:     Cardiomegaly 429.3   History:         Patient has prior history of Echocardiogram examinations. CHF;                  Risk Factors: Hypertension. AAA, AICD, CKD, dysrhythmia,                  ischemic cardiomyopathy, Moderate tricuspid regurgitation, PAF,                  pacemaker, CABG.   Sonographer:     Sherrie Sport Referring Phys:  [id number] Alisa Graff Diagnosing Phys: Kathlyn Sacramento MD  IMPRESSIONS    1. The left ventricle has moderately reduced systolic function, with an ejection fraction of 35-40%. The cavity size was moderately dilated. Left ventricular diastology could not be evaluated.  2. The right ventricle has mildly reduced systolic function. The cavity was moderately enlarged. There is no increase in right ventricular wall thickness.  3. Left atrial size was severely dilated.  4. Right atrial size was severely dilated.  5. The mitral valve is normal in structure. Mitral valve regurgitation is mild to moderate by color flow Doppler.  The MR jet is eccentric posteriorly directed.  6. The tricuspid valve is normal in structure.  7. The aortic valve is tricuspid Aortic valve regurgitation is mild by color flow Doppler.  8. The pulmonic valve was normal in structure.  9. There is moderate dilatation of the aortic root and of the ascending aorta. 10. Moderately dilated pulmonary artery. 11. Pulmonary hypertension is moderate. 12. The inferior vena cava was dilated in size with <50% respiratory variability. 13. Right atrial pressure is estimated at 15 mmHg.  FINDINGS  reviewed. Histograms appropriate. Leads and battery stable for patient. Follow up as outlined above. No recommended changes.   Indications   Chronic systolic heart failure (HCC) (I50.22 (ICD-10-CM)) Ischemic cardiomyopathy (I25.5 (ICD-10-CM)) Conclusion   Normal remote reviewed. Battery estimated 15 months  Next remote 07/26/18. JMoose    Reproductive/Obstetrics  Anesthesia Physical Anesthesia Plan  ASA: III  Anesthesia Plan: MAC   Post-op Pain Management:    Induction:   PONV Risk Score and Plan: 1  Airway Management Planned: Natural Airway, Mask and Nasal Cannula  Additional Equipment:   Intra-op Plan:   Post-operative Plan:   Informed Consent: I have reviewed the patients History and Physical, chart, labs and discussed the procedure including the risks, benefits and alternatives for the proposed anesthesia with the patient or authorized representative who has indicated his/her understanding and acceptance.     Dental advisory given  Plan Discussed with: CRNA  Anesthesia Plan Comments:         Anesthesia Quick Evaluation                                  Anesthesia Evaluation  Patient identified by MRN, date of birth, ID band Patient awake    Reviewed: Allergy & Precautions, NPO status , Patient's Chart, lab work & pertinent test results  History of Anesthesia  Complications Negative for: history of anesthetic complications  Airway Mallampati: II       Dental   Pulmonary asthma , neg sleep apnea, neg COPD, former smoker,           Cardiovascular hypertension, Pt. on medications and Pt. on home beta blockers + CAD, + Past MI and +CHF  + dysrhythmias + pacemaker + Cardiac Defibrillator      Neuro/Psych neg Seizures    GI/Hepatic Neg liver ROS, PUD, neg GERD  ,  Endo/Other  neg diabetes  Renal/GU Renal InsufficiencyRenal disease     Musculoskeletal   Abdominal   Peds  Hematology   Anesthesia Other Findings   Reproductive/Obstetrics                             Anesthesia Physical Anesthesia Plan  ASA: III  Anesthesia Plan: MAC   Post-op Pain Management:    Induction: Intravenous  PONV Risk Score and Plan:   Airway Management Planned:   Additional Equipment:   Intra-op Plan:   Post-operative Plan:   Informed Consent: I have reviewed the patients History and Physical, chart, labs and discussed the procedure including the risks, benefits and alternatives for the proposed anesthesia with the patient or authorized representative who has indicated his/her understanding and acceptance.     Plan Discussed with:   Anesthesia Plan Comments:         Anesthesia Quick Evaluation

## 2018-05-27 NOTE — Progress Notes (Signed)
**Note De-Identified vi Obfusction** PROGRESS NOTE    Thomas [name]  LZJ:[id number] DOB: 12/05/1934 DOA: 05/25/2018 PCP: Mrylnd Pink, MD   Brief Nrrtive:  Thomas [name] is  84 y.o. mle with medicl history significnt for defness, CAD sttus post CABG, chronic systolic CHF with AICD, chronic kidney disese stge III, hypertension, nd proxysml tril fibrilltion on Eliquis, now presenting to the emergency deprtment for evlution of ftigue, mlise, nd incresed dyspne.  Ptient reports insidious worsening in his chronic dyspne over the pst severl dys, hs gined  few pounds over this intervl, nd reports tht his chronic bilterl lower extremity edem hs been fluctuting some.  He denies ny chest pin, hs hd  mild cough, denies fevers or chills, nd denies ny bdominl pin or indigestion.  He hs not noted ny melen or hemtochezi.  He denies ny vomiting.  He ws seen by his crdiologist erlier tody nd he ws sent to the ED for further evlution of his complints.  Assessment & Pln:   Principl Problem:   Occult GI bleeding Active Problems:   CAD Sttus post coronry rtery bypss grfting: 1995   Permnent tril fibrilltion (HCC)   Chronic systolic hert filure (HCC)   Benign essentil HTN   Normocytic nemi   Elevted troponin   GI bleed  1. Occult GI bleeding; nemi  - Hb dropped from 10.2 to 8.4 in setting of chronic nticogultion, positive hemoccult (though brown stool) - Pt with dyspne, ftigue, mlise s well - stble H/H tody, follow dily - Fmily reports tht he hs hx of PUD  - hs not seen ny melen or hemtochezi  - Treted with 80 mg IV Protonix in ED  - Type nd screen, continue Protonix 40 mg IV q12h, trend H&H, hold Eliquis  - GI hs been c/s, pprecite recs - pln for EGD tody - iron deficiency nemi, will pln for IV iron 3/13.  Norml B12, folte.  2. Chronic systolic CHF  - Ptient reports recent wt gin, mild increse in chronic dyspne, nd  fluctuting b/l leg swelling  - holding lsix, imdur, losrtn, decresed dose of coreg due to hypotension yesterdy (discontinued HCTZ, consider trnsitioning rb to entresto nd spironolctone)  # Hypotension: occurred yesterdy, pt noted lighthededness nd crmping.  Holding lsix/imdur/losrtn t the moment.  Dose of bet blocker decresed.  - orthosttics this AM norml  # Crmping: suspect 2/2 dehydrtion, follow.  Improved fter mustrd pcks, IVF.  Continue to monitor.   # Syncopl Episode: per previous notes, occurred on 05/19/18.  Hd negtive hed CT t the time, but I don't see ED note. Interrogtion of pcemker suggested chronic fib per crdiology (no new rrhythmis).   Continue on telemetry Apprecite crds recs Orthosttics norml tody.  Given low BP yesterdy, my be relted to hypotension with BP meds.  3. Atril fibrilltion  - Rte-controlled on dmission  - CHADS-VASc is t lest 5 (ge x2, CHF, HTN, CAD)  - Hold Eliquis while evluting occult GIB   4. Hypertension  - BP t gol  - Coreg nd losrtn on hold - discontinue HCTZ t dischrge s pt on lsix for diuresis.   5. CAD; elevted troponin  - No recent chest pin  - troponins low nd flt, follow up per crdiology  - Continue crdic monitoring, continue sttin, bet-blocker, (imdur nd rb currently on hold)  # Acute Kidney Injury: follow while holding lsix.  Likely 2/2 recent hypotension.  ARB on hold s well.  Continue to monitor. Follow UA.  DVT prophylxis: **Note De-Identified vi Obfusction** SCD Code Sttus: full  Fmily Communiction: none t bedside Disposition Pln: pending GI nd crds evlution nd sign off.  Requires inptient hopsitliztion given concern for GI bleed, need for IV PPI nd inptient evlution.    Consultnts:   Crdiology  GI  Procedures:   none  ntimicrobils:  nti-infectives (From dmission, onwrd)   None     Subjective: Crmping better this M. Feels ok.  Some questions bout  procedure nd pln for lsix. Sign lnguge interpreter used.  Objective: Vitls:   05/27/18 0504 05/27/18 0505 05/27/18 0506 05/27/18 0509  BP: (!) 103/56 (!) 107/57 (!) 108/57 111/61  Pulse: 61 61 75 67  Resp:      Temp: 98 F (36.7 C)     TempSrc: Orl     SpO2: 95%     Weight:   99.1 kg   Height:        Intke/Output Summry (Lst 24 hours) t 05/27/2018 0829 Lst dt filed t 05/27/2018 0700 Gross per 24 hour  Intke 1039.95 ml  Output 2400 ml  Net -1360.05 ml   Filed Weights   05/25/18 2139 05/26/18 0415 05/27/18 0506  Weight: 103.2 kg 102.8 kg 99.1 kg    Exmintion:  Generl: No cute distress. Crdiovsculr: Hert sounds show  regulr rte, nd rhythm.  Lungs: Cler to usculttion bilterlly  bdomen: Soft, nontender, nondistended  Neurologicl: lert nd oriented 3. Moves ll extremities 4. Crnil nerves II through XII grossly intct. Skin: Wrm nd dry. No rshes or lesions. Extremities: No clubbing or cynosis. Trce edem bilterlly. Psychitric: Mood nd ffect re norml. Insight nd judgment re pproprite.   Dt Reviewed: I hve personlly reviewed following lbs nd imging studies  CBC: Recent Lbs  Lb 05/25/18 1651 05/25/18 2246 05/26/18 0626 05/26/18 1359 05/27/18 0457  WBC 5.4  --   --   --  5.0  NEUTROBS 3.6  --   --   --   --   HGB 8.4* 8.1* 8.1* 8.7* 8.0*  HCT 27.8* 26.7* 26.7* 28.5* 25.5*  MCV 83.5  --   --   --  79.9*  PLT 241  --   --   --  161   Bsic Metbolic Pnel: Recent Lbs  Lb 05/25/18 1651 05/26/18 0626 05/26/18 2025 05/27/18 0457  N 140 140  --  139  K 4.0 4.0  --  3.6  CL 111 111  --  107  CO2 24 23  --  25  GLUCOSE 89 98  --  93  BUN 24* 23  --  24*  CRETININE 1.23 1.28*  --  1.60*  CLCIUM 8.5* 8.1*  --  8.4*  MG  --   --  2.0  --    GFR: Estimted Cretinine Clernce: 45.6 mL/min () (by [place] formul bsed on SCr of 1.6 mg/dL (H)). Liver Function Tests: Recent Lbs  Lb 05/25/18  1651  ST 26  LT 16  LKPHOS 95  BILITOT 0.7  PROT 4.9*  LBUMIN 2.5*   No results for input(s): LIPSE, MYLSE in the lst 168 hours. No results for input(s): MMONI in the lst 168 hours. Cogultion Profile: No results for input(s): INR, PROTIME in the lst 168 hours. Crdic Enzymes: Recent Lbs  Lb 05/25/18 1651 05/25/18 2246  TROPONINI 0.04* 0.03*   BNP (lst 3 results) No results for input(s): PROBNP in the lst 8760 hours. Hb1C: No results for input(s): HGB1C in the lst 72 hours. CBG: No results for input(s): GLUCP in **Note De-Identified vi Obfusction** the lst 168 hours. Lipid Profile: No results for input(s): CHOL, HDL, LDLCALC, TRIG, CHOLHDL, LDLDIRECT in the lst 72 hours. Thyroid Function Tests: No results for input(s): TSH, T4TOTAL, FREET4, T3FREE, THYROIDAB in the lst 72 hours. Anemi Pnel: Recent Lbs    05/26/18 1359  VITAMINB12 556  FOLATE 11.5  FERRITIN 21*  TIBC 356  IRON 22*   Sepsis Lbs: No results for input(s): PROCALCITON, LATICACIDVEN in the lst 168 hours.  Recent Results (from the pst 240 hour(s))  MRSA PCR Screening     Sttus: None   Collection Time: 05/25/18 11:00 PM  Result Vlue Ref Rnge Sttus   MRSA by PCR NEGATIVE NEGATIVE Finl    Comment:        The GeneXpert MRSA Assy (FDA pproved for NASAL specimens only), is one component of  comprehensive MRSA coloniztion surveillnce progrm. It is not intended to dignose MRSA infection nor to guide or monitor tretment for MRSA infections. Performed t [facility] [address], [place], [place] [address]          Rdiology Studies: Dg Chest 2 View  Result Dte: 05/25/2018 CLINICAL DATA:  Short of breth productive cough COMPARISON:  03/14/2018 FINDINGS: CABG.  AICD.  Crdic enlrgement. Smll pleurl effusions unchnged from the prior study. Negtive for edem or pneumoni. IMPRESSION: Chronic smll pleurl effusions unchnged. Negtive for edem or pneumoni. Electroniclly  Signed   By: Frnchot Gllo M.D.   On: 05/25/2018 17:34        Scheduled Meds: . crvedilol  6.25 mg Orl BID WC  . pntoprzole (PROTONIX) IV  40 mg Intrvenous Q12H  . simvsttin  20 mg Orl q1800  . sodium chloride flush  3 mL Intrvenous Q12H   Continuous Infusions: . sodium chloride    . sodium chloride Stopped (05/26/18 2245)  . ferumoxytol    . lctted ringers 75 mL/hr t 05/26/18 2247     LOS: 1 dy    Time spent: over 30 min    Fyrene Helper, MD [facility] AMION  If 7PM-7AM, plese contct night-coverge [email or web address] [facility] 05/27/2018, 8:29 AM

## 2018-05-27 NOTE — Transfer of Care (Signed)
Immediate Anesthesia Transfer of Care Note  Patient: Thomas [name]  Procedure(s) Performed: ESOPHAGOGASTRODUODENOSCOPY (EGD) WITH PROPOFOL (N/A )  Patient Location: Endoscopy Unit  Anesthesia Type:MAC  Level of Consciousness: drowsy  Airway & Oxygen Therapy: Patient Spontanous Breathing and Patient connected to nasal cannula oxygen  Post-op Assessment: Report given to RN and Post -op Vital signs reviewed and stable  Post vital signs: Reviewed and stable  Last Vitals:  Vitals Value Taken Time  BP    Temp    Pulse    Resp    SpO2      Last Pain:  Vitals:   05/27/18 1224  TempSrc: Oral  PainSc: 0-No pain      Patients Stated Pain Goal: 0 (41/66/06 3016)  Complications: No apparent anesthesia complications

## 2018-05-28 ENCOUNTER — Encounter (HOSPITAL_COMMUNITY): Payer: Self-pay | Admitting: Gastroenterology

## 2018-05-28 LAB — CBC
HCT: 27.8 % — ABNORMAL LOW (ref 39.0–52.0)
Hemoglobin: 8.6 g/dL — ABNORMAL LOW (ref 13.0–17.0)
MCH: 25.3 pg — ABNORMAL LOW (ref 26.0–34.0)
MCHC: 30.9 g/dL (ref 30.0–36.0)
MCV: 81.8 fL (ref 80.0–100.0)
Platelets: 185 10*3/uL (ref 150–400)
RBC: 3.4 MIL/uL — ABNORMAL LOW (ref 4.22–5.81)
RDW: 17.4 % — AB (ref 11.5–15.5)
WBC: 4.9 10*3/uL (ref 4.0–10.5)
nRBC: 0 % (ref 0.0–0.2)

## 2018-05-28 LAB — BASIC METABOLIC PANEL
Anion gap: 7 (ref 5–15)
BUN: 32 mg/dL — AB (ref 8–23)
CO2: 23 mmol/L (ref 22–32)
Calcium: 8.5 mg/dL — ABNORMAL LOW (ref 8.9–10.3)
Chloride: 108 mmol/L (ref 98–111)
Creatinine, Ser: 1.73 mg/dL — ABNORMAL HIGH (ref 0.61–1.24)
GFR calc Af Amer: 42 mL/min — ABNORMAL LOW (ref >60)
GFR calc non Af Amer: 36 mL/min — ABNORMAL LOW (ref >60)
Glucose, Bld: 116 mg/dL — ABNORMAL HIGH (ref 70–99)
Potassium: 4.1 mmol/L (ref 3.5–5.1)
Sodium: 138 mmol/L (ref 135–145)

## 2018-05-28 MED ORDER — PANTOPRAZOLE SODIUM 40 MG PO TBEC: 40.0000 mg | DELAYED_RELEASE_TABLET | Freq: Every day | ORAL | 0 refills | Status: DC

## 2018-05-28 MED ORDER — APIXABAN 2.5 MG PO TABS: 2.5000 mg | ORAL_TABLET | Freq: Two times a day (BID) | ORAL | 0 refills | Status: DC

## 2018-05-28 MED ORDER — FERROUS SULFATE 325 (65 FE) MG PO TABS: 325.0000 mg | ORAL_TABLET | Freq: Every day | ORAL | 0 refills | Status: DC

## 2018-05-28 NOTE — Progress Notes (Signed)
Progress Note  Patient Name: Thomas [name] Date of Encounter: 05/28/2018    Patient Profile   84 y.o. 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    Subjective   With interpreter Ambulating without CP or SOB  Inpatient Medications    Scheduled Meds: . carvedilol  6.25 mg Oral BID WC  . pantoprazole  40 mg Oral Daily  . simvastatin  20 mg Oral q1800  . sodium chloride flush  3 mL Intravenous Q12H   Continuous Infusions: . sodium chloride     PRN Meds: sodium chloride, acetaminophen, albuterol, cyclobenzaprine, Influenza vac split quadrivalent PF, ondansetron (ZOFRAN) IV, sodium chloride flush   Vital Signs    Vitals:   05/27/18 1430 05/27/18 2001 05/28/18 0159 05/28/18 0535  BP: (!) 109/55 (!) 134/99  125/61  Pulse: (!) 59 73  70  Resp: (!) 22 18  18   Temp:  97.6 F (36.4 C)  98 F (36.7 C)  TempSrc:  Oral  Oral  SpO2: 93% 98%  94%  Weight:   100.1 kg   Height:        Intake/Output Summary (Last 24 hours) at 05/28/2018 1011 Last data filed at 05/28/2018 0252 Gross per 24 hour  Intake 664 ml  Output 400 ml  Net 264 ml   Filed Weights   05/26/18 0415 05/27/18 0506 05/28/18 0159  Weight: 102.8 kg 99.1 kg 100.1 kg    Telemetry    afib  - Personally Reviewed  ECG      Physical Exam   GEN: No acute distress.   Neck: JVDflat Cardiac: irregularly irregular no  murmurs, rubs, or gallops.  Respiratory: Clear to auscultation bilaterally. GI: Soft, nontender, non-distended   MS:  edema; No deformity. Neuro:  Nonfocal  Psych: Normal affect  Skin Warm and dry   Labs    Chemistry Recent Labs  Lab 05/25/18 1651 05/26/18 0626 05/27/18 0457 05/28/18 0355  NA 140 140 139 138  K 4.0 4.0 3.6 4.1  CL 111 111 107 108  CO2 24 23 25 23   GLUCOSE 89 98 93 116*  BUN 24* 23 24* 32*  CREATININE 1.23 1.28* 1.60* 1.73*  CALCIUM 8.5* 8.1* 8.4* 8.5*  PROT 4.9*  --   --   --   ALBUMIN 2.5*  --   --   --   AST 26  --   --   --   ALT 16  --   --   --   ALKPHOS 95  --   --   --   BILITOT 0.7  --   --   --   GFRNONAA 55* 52* 40* 36*  GFRAA >60 >60 46* 42*  ANIONGAP 5 6 7 7      Hematology Recent Labs  Lab 05/25/18 1651  05/26/18 1359 05/27/18 0457 05/28/18 0355  WBC 5.4  --   --  5.0 4.9  RBC 3.33*  --   --  3.19* 3.40*  HGB 8.4*   < > 8.7* 8.0* 8.6*  HCT 27.8*   < > 28.5* 25.5* 27.8*  MCV 83.5  --   --  79.9* 81.8  MCH 25.2*  --   --  25.1* 25.3*  MCHC 30.2  --   --  31.4 30.9  RDW 17.4*  --   --  17.3* 17.4*  PLT 241  --   --  213 185   < > = values in this interval not displayed.    Cardiac Enzymes Recent Labs  Lab 05/25/18 1651 05/25/18 2246  TROPONINI 0.04* 0.03*   No results for input(s): TROPIPOC in the last 168 hours.   BNP Recent Labs  Lab 05/25/18 1651  BNP 292.5*     DDimer No results for input(s): DDIMER in the last 168 hours.   Radiology    Ct Abdomen Pelvis Wo Contrast  Result Date: 05/27/2018 CLINICAL DATA:  Anemia.  History of TURP, prostate cancer. EXAM: CT ABDOMEN AND PELVIS WITHOUT CONTRAST TECHNIQUE: Multidetector CT imaging of the abdomen and pelvis was performed following the standard protocol without IV contrast. COMPARISON:  CT abdomen and pelvis October 21, 2015 FINDINGS: LOWER CHEST: Small pleural effusions. Enlarged RIGHT atrium and inferior vena cava seen with RIGHT heart failure. Cardiomegaly and probable pulmonary edema. Status post median sternotomy. Streak artifact from cardiac pacemaker leads. HEPATOBILIARY:  2.9 cm low-density cyst LEFT lobe of the liver, slightly nodular hepatic contour. Normal gallbladder. PANCREAS: Mild peripancreatic fat stranding, similar to prior examination. No pseudocyst or pancreatic calcifications. SPLEEN: Calcified granulomas.  Normal size. ADRENALS/URINARY TRACT: Kidneys are orthotopic, demonstrating normal size. Atrophic RIGHT kidney, severe within lower pole. 2 mm LEFT upper pole nephrolithiasis. No hydronephrosis; limited assessment for renal masses by nonenhanced CT. The unopacified ureters are normal in course and caliber. Urinary bladder is partially distended and unremarkable. Normal adrenal glands. STOMACH/BOWEL: The stomach, small and large bowel are normal in course and caliber without inflammatory changes, sensitivity decreased by lack of enteric contrast. Mild sigmoid colonic diverticulosis. Normal appendix. VASCULAR/LYMPHATIC: Aortoiliac vessels are normal in course and caliber. Severe calcific atherosclerosis. Aortoiliac stent graft. No lymphadenopathy by CT size criteria. REPRODUCTIVE: Mild prostatomegaly with brachy therapy seeds. OTHER: No intraperitoneal free fluid or free air. MUSCULOSKELETAL: Non-acute. Surgical clips LEFT inguinal soft tissues seen with vascular access IMPRESSION: 1. Mild peripancreatic fat stranding, potential pancreatitis, limited assessment by noncontrast CT. 2. Early suspected cirrhosis.  No findings of portal hypertension. 3. 2 mm nonobstructing LEFT nephrolithiasis.  RIGHT renal atrophy. 4. Cardiomegaly and suspected pulmonary edema. Small pleural effusions. 5.  Aortic Atherosclerosis (ICD10-I70.0). Electronically Signed   By: Elon Alas M.D.   On: 05/27/2018 20:47      Assessment & Plan     Anemia and GI bleeding  hgb 8  Ischemic cardiomyopathy with prior bypass  Atrial fibrillation permanent on anticoagulation currently on hold  Dyspnea with acute/chronic heart failure   Biventricular ICD  Renal  Insufficiency acute grade 3    Syncope thought non cardiac   Home today Needs iron supplements outpt check of BMET as Cr increasing     Signed, Virl Axe, MD  05/28/2018, 10:11 AM

## 2018-05-28 NOTE — Discharge Summary (Signed)
Physician Discharge Summary  Thomas [name] HWE:[id number] DOB: September 12, 1934 DOA: 05/25/2018  PCP: Thomas Pink, MD  Admit date: 05/25/2018 Discharge date: 05/28/2018  Time spent: 62minutes  Recommendations for Outpatient Follow-up:  1. Follow up outpatient CBC/CMP 2. Follow up outpatient renal function to ensure improving off lasix/ARB.  Work up additionally as indicated. Defer resumption of these meds to outpatient PCP/cardiologist pending improvement in renal function and blood pressure. Suspect syncopal episode 2/2 hypotension with BP meds. 3. Would discontinue HCTZ as pt was on lasix 4. Holding eliquis for 1 week.  Continue PPI.  Eliquis dose reduced to 2.5 mg BID given renal function, but please follow up to ensure this is appropriate dose in follow up.   5. Follow up with GI as outpatient to consider colonoscopy. 6. Continue to monitor for si/sx bleeding while resuming eliquis.  Discussed risks/benefits with pt.  7. Consider repeat iron transfusion outpatient.  Started on PO iron. 8. Concern for possible pancreatitis on imaging, no pain on exam 9. Concern for early cirrhosis, follow with PCP/GI   Discharge Diagnoses:  Principal Problem:   Occult GI bleeding Active Problems:   CAD Status post coronary artery bypass grafting: 1995   Permanent atrial fibrillation (HCC)   Chronic systolic heart failure (HCC)   Benign essential HTN   Normocytic anemia   Elevated troponin   GI bleed   Acute on chronic combined systolic and diastolic CHF (congestive heart failure) (Thomas [name])   Discharge Condition: stable  Diet recommendation: heart healthy  Filed Weights   05/26/18 0415 05/27/18 0506 05/28/18 0159  Weight: 102.8 kg 99.1 kg 100.1 kg    History of present illness:  Thomas [name] 84 y.o.malewith medical history significant fordeafness, CAD status post CABG, chronic systolic CHF withAICD, chronic kidney disease stage III, hypertension, and paroxysmal atrial fibrillation on  Eliquis, now presenting to the emergency department for evaluation of fatigue, malaise, and increased dyspnea. Patient reports insidious worsening in his chronic dyspnea over the past several days, has gained Thomas [name] few pounds over this interval, and reports that his chronic bilateral lower extremity edema has been fluctuating some. He denies any chest pain, has had Thomas [name] mild cough, denies fevers or chills, and denies any abdominal pain or indigestion. He has not noted any melena or hematochezia. He denies any vomiting. He was seen by his cardiologist earlier today and he was sent to the ED for further evaluation of his complaints.  He was admitted for concern for occult GI bleeding and symptomatic anemia.  He had EGD with GI which showed erythematous duodenopathy.  Eliquis placed on hold x1 week.  Pt started on PPI daily.  Plan for follow up with GI outpatient to discuss possible colonoscopy.  Initially thought to be volume overloaded, but became hypotensive and developed cramping.  His losartan/HCTZ/lasix were discontinued with plans to follow up with outpatient provider regarding possible resumption (HCTZ should be d/c'd if plans for lasix).  He developed AKI during hospitalization, likely related to hypotension/arb.  Will need outpatient follow up.   See below for additional details  Hospital Course:  1.Occult GI bleeding; anemia -Hb dropped from 10.2 to 8.4 in setting of chronic anticoagulation, positive hemoccult (though brown stool) - Hb stable at discharge - Pt with dyspnea, fatigue, malaise as well - stable H/H today, follow daily -Family reports that he has hx of PUD -has not seen any melena or hematochezia -GI c/s, EGD with erythematous duodenopathy.  Started on PPI daily.  Discussed plan for anticoaguation  with GI covering today who discussed hold x1 week, then resume.  Discussed with pt, discussed risks/benefits and precautions.  CT abdomen/pelvis with contrast notable for possible  pancreatitis (no sx) and early cirrhosis.  (follow outpatient). - iron deficiency anemia, s/p IV iron 3/13.  Normal B12, folate.  2.Chronic systolic CHF -LE edema improved today - holding lasix, losartan.  Resume coreg and imdur.  Follow outpatient for resumption.  # Hypotension: improved, follow up when to resume ARB/diuretic.  # Cramping: suspect 2/2 dehydration, resolved.  # Syncopal Episode: per previous notes, occurred on 05/19/18.  Had negative head CT at the time, but I don't see ED note. Interrogation of pacemaker suggested chronic afib per cardiology (no new arrhythmias).   Continue on telemetry Appreciate cards recs Orthostatics normal today.  Given low BP , may be related to hypotension with BP meds.  3.Atrial fibrillation -Rate-controlled on admission -CHADS-VASc is at least 71 (age x2, CHF, HTN, CAD) -Hold Eliquis while evaluating occult GIB- restart in 1 week with lower dose given AKI - follow up with outpatient provider regarding long term dosing.  4.Hypertension -BP at goal - losartan on hold.  Continue imdur/coreg.  5.CAD; elevated troponin -No recent chest pain -troponins low and flat, follow up per cardiology  -Continue cardiac monitoring, continue statin, beta-blocker, imdur (and arb currently on hold)  # Acute Kidney Injury: follow while holding lasix.  Likely 2/2 recent hypotension.  ARB on hold as well.  Continue to monitor.  UA not collected prior to d/c.  Discussed f/u outpatient early next week.  CT without obstruction.  # Early cirrhosis: follow outpatient.   Procedures: EGD Normal larynx. - Small hiatal hernia. - Normal stomach. - Erythematous duodenopathy. - Normal third portion of the duodenum. - The examination was otherwise normal. - Clotted blood in the pylorus. - No specimens collected. Impression: - Soft diet today. - Continue present medications. Reevaluate blood thinner needs - Return to GI clinic PRN. -  Telephone GI clinic if symptomatic PRN. - Perform [name] CT scan (computed tomography) of abdomen without contrast and pelvis without contrast at appointment to be   Consultations:  GI  Cardiology  Discharge Exam: Vitals:   05/27/18 2001 05/28/18 0535  BP: (!) 134/99 125/61  Pulse: 73 70  Resp: 18 18  Temp: 97.6 F (36.4 C) 98 F (36.7 C)  SpO2: 98% 94%   Doing well Looking forward to d/c home Wife at bedside Discussed extensively with sign language interpreter present  General: No acute distress. Cardiovascular: Heart sounds show Abdulrahman Bracey regular rate, and rhythm.  Lungs: Clear to auscultation bilaterally  Abdomen: Soft, nontender, nondistended  Neurological: Alert and oriented 3. Moves all extremities 4. Cranial nerves II through XII grossly intact. Skin: Warm and dry. No rashes or lesions. Extremities: No clubbing or cyanosis. No edema. Psychiatric: Mood and affect are normal. Insight and judgment are appropriate.  Discharge Instructions   Discharge Instructions    (HEART FAILURE PATIENTS) Call MD:  Anytime you have any of the following symptoms: 1) 3 pound weight gain in 24 hours or 5 pounds in 1 week 2) shortness of breath, with or without Gidget Quizhpi dry hacking cough 3) swelling in the hands, feet or stomach 4) if you have to sleep on extra pillows at night in order to breathe.   Complete by:  As directed    Call MD for:  difficulty breathing, headache or visual disturbances   Complete by:  As directed    Call MD for:  extreme  fatigue   Complete by:  As directed    Call MD for:  hives   Complete by:  As directed    Call MD for:  persistant dizziness or light-headedness   Complete by:  As directed    Call MD for:  persistant nausea and vomiting   Complete by:  As directed    Call MD for:  redness, tenderness, or signs of infection (pain, swelling, redness, odor or green/yellow discharge around incision site)   Complete by:  As directed    Call MD for:  severe uncontrolled pain    Complete by:  As directed    Diet - low sodium heart healthy   Complete by:  As directed    Discharge instructions   Complete by:  As directed    You were seen for low blood counts and concern for gastrointestinal bleeding.  You had an EGD with Dr. Watt Climes which showed erythematous duodenopathy.  Take protonix 40 mg daily.  Please follow up with Dr. Watt Climes as an outpatient.  You'll discuss possible colonoscopy with him as an outpatient.  Do not take your eliquis until 3/21.  After 3/21, you can restart your eliquis,  I've changed the dose to 2.5 mg twice daily because of your kidney function.  Please ask your PCP what dose you should take of your eliquis (if your kidney function improves, you maybe able to resume your prior dose).  If you notice signs or symptoms of bleeding, stop your eliquis.  Stop your losartan-HCTZ combination pill.  Do not take your lasix now.  Please follow up with your PCP in [place] few days (between 3/16 - 3/19, ideally) to repeat labs.  Do not restart your lasix or losartan until your PCP or cardiologist tells you it's ok.  (we have stopped these medications because you had low blood pressure and some kidney injury, you need repeat labs early next week to recheck your kidney function - your repeat kidney labs will also help [place] tell what dose of eliquis you will need to restart next week).  I think your fainting was probably related to low blood pressure.  Continue iron daily.  Please follow up repeat iron studies as an outpatient with your PCP.  Discuss repeat iron infusion with them as well.  Your CT scan showed early suspected cirrhosis.  Please follow this up with your primary care doctor.  Please discuss your imaging studies from this hospital stay with your PCP.  Return for new, recurrent, or worsening symptoms.  Please ask your PCP to request records from this hospitalization so they know what was done and what the next steps will be.   Increase activity slowly   Complete  by:  As directed      Allergies as of 05/28/2018      Reactions   Phenazopyridine Nausea Only, Other (See Comments)   GI UPSET   Ramipril Other (See Comments)   unk      Medication List    STOP taking these medications   furosemide 80 MG tablet Commonly known as:  LASIX   losartan-hydrochlorothiazide 50-12.5 MG tablet Commonly known as:  HYZAAR   potassium chloride 10 MEQ tablet Commonly known as:  K-DUR     TAKE these medications   albuterol 108 (90 Base) MCG/ACT inhaler Commonly known as:  PROVENTIL HFA;VENTOLIN HFA Inhale 2 puffs into the lungs 4 (four) times daily as needed.   apixaban 2.5 MG Tabs tablet Commonly known as:  Eliquis Take  1 tablet (2.5 mg total) by mouth 2 (two) times daily for 30 days. (start on 3/21.  Please review dosing with your PCP prior to restarting) Start taking on:  June 04, 2018 What changed:    medication strength  See the new instructions.  These instructions start on June 04, 2018. If you are unsure what to do until then, ask your doctor or other care provider.   carvedilol 12.5 MG tablet Commonly known as:  COREG Take 12.5 mg by mouth 2 (two) times daily with Alayzia Pavlock meal.   ferrous sulfate 325 (65 FE) MG tablet Take 1 tablet (325 mg total) by mouth daily for 30 days.   HYDROCORTISONE (TOPICAL) 2 % Lotn Apply 40 oz topically 2 (two) times daily. APPLY BID TO AFFECTED AREA   isosorbide mononitrate 60 MG 24 hr tablet Commonly known as:  IMDUR Take 1.5 tablets by mouth daily What changed:    how much to take  how to take this  when to take this   mirabegron ER 50 MG Tb24 tablet Commonly known as:  MYRBETRIQ Take 1 tablet (50 mg total) by mouth daily.   pantoprazole 40 MG tablet Commonly known as:  PROTONIX Take 1 tablet (40 mg total) by mouth daily for 30 days. Start taking on:  May 29, 2018   simvastatin 20 MG tablet Commonly known as:  ZOCOR TAKE 1 TABLET BY MOUTH DAILY   vitamin B-12 500 MCG tablet Commonly  known as:  CYANOCOBALAMIN Take 500 mcg by mouth daily.      Allergies  Allergen Reactions  . Phenazopyridine Nausea Only and Other (See Comments)    GI UPSET  . Ramipril Other (See Comments)    unk   Follow-up Information    Clarene Essex, MD Follow up.   Specialty:  Gastroenterology Why:  Call for follow up appointment Contact information: [address] [place] [address] [phone]        Thomas Pink, MD Follow up.   Specialty:  Family Medicine Why:  Follow up within [place] few days of discharge Contact information: [place] [address] [phone]        Sanda Klein, MD Follow up.   Specialty:  Cardiology Why:  call for follow up Contact information: [address] [place] [address] [phone]            The results of significant diagnostics from this hospitalization (including imaging, microbiology, ancillary and laboratory) are listed below for reference.    Significant Diagnostic Studies: Ct Abdomen Pelvis Wo Contrast  Result Date: 05/27/2018 CLINICAL DATA:  Anemia.  History of TURP, prostate cancer. EXAM: CT ABDOMEN AND PELVIS WITHOUT CONTRAST TECHNIQUE: Multidetector CT imaging of the abdomen and pelvis was performed following the standard protocol without IV contrast. COMPARISON:  CT abdomen and pelvis October 21, 2015 FINDINGS: LOWER CHEST: Small pleural effusions. Enlarged RIGHT atrium and inferior vena cava seen with RIGHT heart failure. Cardiomegaly and probable pulmonary edema. Status post median sternotomy. Streak artifact from cardiac pacemaker leads. HEPATOBILIARY: 2.9 cm low-density cyst LEFT lobe of the liver, slightly nodular hepatic contour. Normal gallbladder. PANCREAS: Mild peripancreatic fat stranding, similar to prior examination. No pseudocyst or pancreatic calcifications. SPLEEN: Calcified granulomas.  Normal size. ADRENALS/URINARY TRACT: Kidneys are orthotopic, demonstrating normal  size. Atrophic RIGHT kidney, severe within lower pole. 2 mm LEFT upper pole nephrolithiasis. No hydronephrosis; limited assessment for renal masses by nonenhanced CT. The unopacified ureters are normal in course and  caliber. Urinary bladder is partially distended and unremarkable. Normal adrenal glands. STOMACH/BOWEL: The stomach, small and large bowel are normal in course and caliber without inflammatory changes, sensitivity decreased by lack of enteric contrast. Mild sigmoid colonic diverticulosis. Normal appendix. VASCULAR/LYMPHATIC: Aortoiliac vessels are normal in course and caliber. Severe calcific atherosclerosis. Aortoiliac stent graft. No lymphadenopathy by CT size criteria. REPRODUCTIVE: Mild prostatomegaly with brachy therapy seeds. OTHER: No intraperitoneal free fluid or free air. MUSCULOSKELETAL: Non-acute. Surgical clips LEFT inguinal soft tissues seen with vascular access IMPRESSION: 1. Mild peripancreatic fat stranding, potential pancreatitis, limited assessment by noncontrast CT. 2. Early suspected cirrhosis.  No findings of portal hypertension. 3. 2 mm nonobstructing LEFT nephrolithiasis.  RIGHT renal atrophy. 4. Cardiomegaly and suspected pulmonary edema. Small pleural effusions. 5.  Aortic Atherosclerosis (ICD10-I70.0). Electronically Signed   By: Elon Alas M.D.   On: 05/27/2018 20:47   Dg Chest 2 View  Result Date: 05/25/2018 CLINICAL DATA:  Short of breath productive cough COMPARISON:  03/14/2018 FINDINGS: CABG.  AICD.  Cardiac enlargement. Small pleural effusions unchanged from the prior study. Negative for edema or pneumonia. IMPRESSION: Chronic small pleural effusions unchanged. Negative for edema or pneumonia. Electronically Signed   By: Franchot Gallo M.D.   On: 05/25/2018 17:34   Ct Head Wo Contrast  Result Date: 05/19/2018 CLINICAL DATA:  Initial evaluation for acute ataxia, recent fall. EXAM: CT HEAD WITHOUT CONTRAST TECHNIQUE: Contiguous axial images were obtained from  the base of the skull through the vertex without intravenous contrast. COMPARISON:  Prior head CT from 07/15/2016. FINDINGS: Brain: Generalized age-related cerebral atrophy. Patchy hypodensity within the periventricular white matter most consistent with chronic microvascular ischemic disease, mild for age. Superimposed remote lacunar infarcts present within the bilateral basal ganglia and left thalamus. No acute intracranial hemorrhage. No acute large vessel territory infarct. No mass lesion, midline shift or mass effect. No hydrocephalus. No extra-axial fluid collection. Vascular: No hyperdense vessel. Scattered vascular calcifications noted within the carotid siphons. Skull: Scalp soft tissues and calvarium within normal limits. Sinuses/Orbits: Globes and orbital soft tissues demonstrate no acute finding. Paranasal sinuses are largely clear. Trace bilateral mastoid effusions noted, of doubtful significance. Other: None. IMPRESSION: 1. No acute intracranial abnormality. 2. Age-related cerebral atrophy with mild chronic small vessel ischemic disease, with small remote lacunar infarcts involving the bilateral basal ganglia and left thalamus. Electronically Signed   By: Jeannine Boga M.D.   On: 05/19/2018 17:21    Microbiology: Recent Results (from the past 240 hour(s))  MRSA PCR Screening     Status: None   Collection Time: 05/25/18 11:00 PM  Result Value Ref Range Status   MRSA by PCR NEGATIVE NEGATIVE Final    Comment:        The GeneXpert MRSA Assay (FDA approved for NASAL specimens only), is one component of [place] comprehensive MRSA colonization surveillance program. It is not intended to diagnose MRSA infection nor to guide or monitor treatment for MRSA infections. Performed at [facility], [place] [address] [place], [place] [address]      Labs: Basic Metabolic Panel: Recent Labs  Lab 05/25/18 1651 05/26/18 0626 05/26/18 2025 05/27/18 0457 05/28/18 0355  NA 140 140  --   139 138  K 4.0 4.0  --  3.6 4.1  CL 111 111  --  107 108  CO2 24 23  --  25 23  GLUCOSE 89 98  --  93 116*  BUN 24* 23  --  24* 32*  CREATININE 1.23  1.28*  --  1.60* 1.73*  CALCIUM 8.5* 8.1*  --  8.4* 8.5*  MG  --   --  2.0  --   --    Liver Function Tests: Recent Labs  Lab 05/25/18 1651  AST 26  ALT 16  ALKPHOS 95  BILITOT 0.7  PROT 4.9*  ALBUMIN 2.5*   No results for input(s): LIPASE, AMYLASE in the last 168 hours. No results for input(s): AMMONIA in the last 168 hours. CBC: Recent Labs  Lab 05/25/18 1651 05/25/18 2246 05/26/18 0626 05/26/18 1359 05/27/18 0457 05/28/18 0355  WBC 5.4  --   --   --  5.0 4.9  NEUTROABS 3.6  --   --   --   --   --   HGB 8.4* 8.1* 8.1* 8.7* 8.0* 8.6*  HCT 27.8* 26.7* 26.7* 28.5* 25.5* 27.8*  MCV 83.5  --   --   --  79.9* 81.8  PLT 241  --   --   --  213 185   Cardiac Enzymes: Recent Labs  Lab 05/25/18 1651 05/25/18 2246  TROPONINI 0.04* 0.03*   BNP: BNP (last 3 results) Recent Labs    02/01/18 0511 03/14/18 1736 05/25/18 1651  BNP 400.0* 375.8* 292.5*    ProBNP (last 3 results) No results for input(s): PROBNP in the last 8760 hours.  CBG: No results for input(s): GLUCAP in the last 168 hours.     Signed:  Fayrene Helper MD.  [facility] 05/28/2018, 11:45 AM

## 2018-05-31 DIAGNOSIS — N289 Disorder of kidney and ureter, unspecified: Secondary | ICD-10-CM | POA: Diagnosis not present

## 2018-05-31 DIAGNOSIS — I10 Essential (primary) hypertension: Secondary | ICD-10-CM | POA: Diagnosis not present

## 2018-05-31 DIAGNOSIS — D509 Iron deficiency anemia, unspecified: Secondary | ICD-10-CM | POA: Diagnosis not present

## 2018-05-31 NOTE — Anesthesia Postprocedure Evaluation (Signed)
Anesthesia Post Note  Patient: Thomas [name]  Procedure(s) Performed: ESOPHAGOGASTRODUODENOSCOPY (EGD) WITH PROPOFOL (N/A )     Patient location during evaluation: Endoscopy Anesthesia Type: MAC Level of consciousness: awake Pain management: pain level controlled Vital Signs Assessment: post-procedure vital signs reviewed and stable Respiratory status: spontaneous breathing Cardiovascular status: stable Postop Assessment: no apparent nausea or vomiting Anesthetic complications: no    Last Vitals:  Vitals:   05/28/18 0535 05/28/18 1213  BP: 125/61 128/74  Pulse: 70 82  Resp: 18 (!) 22  Temp: 36.7 C 36.7 C  SpO2: 94% 94%    Last Pain:  Vitals:   05/28/18 0800  TempSrc:   PainSc: 0-No pain   Pain Goal: Patients Stated Pain Goal: 0 (05/26/18 1650)                 Huston Foley

## 2018-06-02 DIAGNOSIS — D509 Iron deficiency anemia, unspecified: Secondary | ICD-10-CM | POA: Diagnosis not present

## 2018-06-02 DIAGNOSIS — N289 Disorder of kidney and ureter, unspecified: Secondary | ICD-10-CM | POA: Diagnosis not present

## 2018-06-02 DIAGNOSIS — I10 Essential (primary) hypertension: Secondary | ICD-10-CM | POA: Diagnosis not present

## 2018-06-10 ENCOUNTER — Telehealth: Payer: Self-pay

## 2018-06-10 NOTE — Telephone Encounter (Signed)
CALLED PT VIA TELETYPE INTERPRETER AND PT IS AMENABLE TO HAVING A TELEPHONE VISIT AT THAT TIME-NO WEBEX WILL BE AVAILABLE FOR THIS VISIT AS THEY DO NOT HAVE A COMPUTER OR EMAIL HE DOES ASK TO HAVE THE AVS MAILED TO HIM AFTER THE VISIT. HE WILL HAVE MEDICATIONS, BP READING AND WEIGHT AVAILABLE AT 1:15 PM TO BE REVIEWED AT THAT TIME VIA TELETYPE. APPT TYPE CHANGED FOR TELEVISIT

## 2018-06-13 ENCOUNTER — Other Ambulatory Visit: Payer: Self-pay

## 2018-06-13 NOTE — Progress Notes (Signed)
Virtual Visit via Telephone Note     Evaluation Performed:  Follow-up visit post hospitalization   This visit type was conducted due to national recommendations for restrictions regarding the COVID-19 Pandemic (e.g. social distancing).  This format is felt to be most appropriate for this patient at this time.  All issues noted in this document were discussed and addressed.  No physical exam was performed (except for noted visual exam findings with Telehealth visits).  See MyChart message from today for the patient's consent to telehealth for [facility].  Date:  06/14/2018   ID:  Thomas [name], DOB February 18, 1935, MRN [id number]  Patient Location:  [place] [address]   Provider location:   [place], [place] -home office   PCP:  [name], MD  Cardiologist:  Sanda Klein, MD  Electrophysiologist:  None   Chief Complaint:  Post hospital follow up CHF.   History of Present Illness:    Thomas [name] is a 84 y.o. male who presents via audio/video conferencing for a telehealth visit today. (This visit is with the assistance of a deaf interpretor over the phone, as patient and his wife are deaf.) He has a history of CAD s/p CABG with multiple PCI, BiV ICD, AAA, bleeding ulcers, persistent atrial fib, remote CVA and mixed CHF, with ICM, EF of 35%-40% per recent echo in 04/2018.   He was admitted to the hospital on 05/25/2018 after being seen by me in the office with near syncope, cough, congestion, and dyspnea. He was found to be anemic (Hgb of 8) with positive FOBT.   He was also found to have decompensated CHF, likely related to low albumin per Dr. Debara Pickett. He was found to be in atrial fib per device interrogation. Eliquis was put on hold in the setting of anemia. He was to follow up with GI for colonoscopy at their discretion.   Lasix, HCTZ and ARB were discontinued , Eliquis was to be on hold for one week (stopped on 05/26/2018) and was to be restarted at 2.5 mg  BID, he was to see GI for OP evaluation and colonoscopy. It was recommended that he have iron transfusion as an OP. He was started on po iron. His weight on discharge was 101.1 kg ( 222.4 lbs)  Based upon the discharge instructions, he is not to be on the above, but apparently is confused by this, as he has started taking the lasix again. He states he was told to take the medications again this week. This is not indicated in the discharge summary.   He denies dizziness, edema or dyspnea.   The patient he does not have symptoms concerning for COVID-19 infection (fever, chills, cough, or new SHORTNESS OF BREATH).    Prior CV studies:   The following studies were reviewed today: Echocardiogram 05/02/2018 1. The left ventricle has moderately reduced systolic function, with an ejection fraction of 35-40%. The cavity size was moderately dilated. Left ventricular diastology could not be evaluated.  2. The right ventricle has mildly reduced systolic function. The cavity was moderately enlarged. There is no increase in right ventricular wall thickness.  3. Left atrial size was severely dilated.  4. Right atrial size was severely dilated.  5. The mitral valve is normal in structure. Mitral valve regurgitation is mild to moderate by color flow Doppler. The MR jet is eccentric posteriorly directed.  6. The tricuspid valve is normal in structure.  7. The aortic valve is tricuspid Aortic valve regurgitation  is mild by color flow Doppler.  8. The pulmonic valve was normal in structure.  9. There is moderate dilatation of the aortic root and of the ascending aorta. 10. Moderately dilated pulmonary artery. 11. Pulmonary hypertension is moderate. 12. The inferior vena cava was dilated in size with <50% respiratory variability. 13. Right atrial pressure is estimated at 15 mmHg.  CT of the Head 05/27/2018 1. No acute intracranial abnormality. 2. Age-related cerebral atrophy with mild chronic small  vessel ischemic disease, with small remote lacunar infarcts involving the bilateral basal ganglia and left thalamus.  CT of the Abdomen 05/27/2018 IMPRESSION: 1. Mild peripancreatic fat stranding, potential pancreatitis, limited assessment by noncontrast CT. 2. Early suspected cirrhosis.  No findings of portal hypertension. 3. 2 mm nonobstructing LEFT nephrolithiasis.  RIGHT renal atrophy. 4. Cardiomegaly and suspected pulmonary edema. Small pleural effusions. 5.  Aortic Atherosclerosis (ICD10-I70.0).   Past Medical History:  Diagnosis Date   AICD (automatic cardioverter/defibrillator) present    Biventricular ICD (implantable cardioverter-defibrillator) in place 03/24/2005   Implantation of a Med[employer]onic Adapta ADDRO1, serial number [id number]   CHF (congestive heart failure) (HCC)    CKD (chronic kidney disease), stage III ([place])    Coronary artery disease    a. s/p CABG 1986. b. Multiple PCIs/caths. c. 09/2013: s/p PTCA and BMS to SVG-OM.   Deaf    Dysrhythmia    History of abdominal aortic aneurysm    History of bleeding peptic ulcer 1980   History of epididymitis 2013   HTN (hypertension)    Hydronephrosis with ureteropelvic junction obstruction    Hydroureter on left 2009   Hypertension    Ischemic cardiomyopathy    a. Prior EF 30-35%, s/p BIV-ICD. b. 09/2013: EF 45-50%.   Moderate tricuspid regurgitation    PAF (paroxysmal atrial fibrillation) (HCC)    Presence of permanent cardiac pacemaker    Prostate cancer [facility])    Status post coronary artery bypass grafting 1986   LIMA to the LAD, SVG to OM, SVG to RCA   Testicular swelling    Past Surgical History:  Procedure Laterality Date   2-D echocardiogram  11/20/2011   Ejection fraction 30-35% moderate concentric left ventricular hypertrophy. Left atrium is moderately dilated. Mild MR. Mild or   BI-VENTRICULAR IMPLANTABLE CARDIOVERTER DEFIBRILLATOR N/A 12/16/2012   Procedure: BI-VENTRICULAR  IMPLANTABLE CARDIOVERTER DEFIBRILLATOR  (CRT-D);  Surgeon: [name], MD;  Location[facility] CATH LAB;  Service: Cardiovascular;  Laterality: N/A;   CARDIAC CATHETERIZATION  12/10/2011   SVG to OM widely patent.  LIMA to LAD patent   CATARACT EXTRACTION W/PHACO Right 10/12/2017   Procedure: CATARACT EXTRACTION PHACO AND INTRAOCULAR LENS PLACEMENT (IOC);  Surgeon: Birder Robson, MD;  Location: [facility] ORS;  Service: Ophthalmology;  Laterality: Right;  [place] 00:57 AP% 15.9 CDE 9.07 Fluid pack lot # [id number]   CORONARY ARTERY BYPASS GRAFT  1986   ESOPHAGOGASTRODUODENOSCOPY (EGD) WITH PROPOFOL N/A 05/27/2018   Procedure: ESOPHAGOGASTRODUODENOSCOPY (EGD) WITH PROPOFOL;  Surgeon: Clarene Essex, MD;  Location: [place];  Service: Endoscopy;  Laterality: N/A;   INSERT / REPLACE / REMOVE PACEMAKER     LEFT HEART CATHETERIZATION WITH CORONARY/GRAFT ANGIOGRAM N/A 12/10/2011   Procedure: LEFT HEART CATHETERIZATION WITH Beatrix Fetters;  Surgeon: Sanda Klein, MD;  Location: [place] CATH LAB;  Service: Cardiovascular;  Laterality: N/A;   LEFT HEART CATHETERIZATION WITH CORONARY/GRAFT ANGIOGRAM N/A 09/25/2013   Procedure: LEFT HEART CATHETERIZATION WITH Beatrix Fetters;  Surgeon: Blane Ohara, MD;  Location: [facility] CATH LAB;  Service: Cardiovascular;  Laterality: N/A;   Persantine Myoview  05/06/2010   Post-rest ejection fraction 30%. No significant ischemia demonstrated. Compared to previous study there is no significant change.   TRANSURETHRAL RESECTION OF PROSTATE     s/p     Current Meds  Medication Sig   apixaban (ELIQUIS) 2.5 MG TABS tablet Take 1 tablet (2.5 mg total) by mouth 2 (two) times daily for 30 days. (start on 3/21.  Please review dosing with your PCP prior to restarting)   carvedilol (COREG) 12.5 MG tablet Take 12.5 mg by mouth daily.   ferrous sulfate 325 (65 FE) MG tablet Take 1 tablet (325 mg total) by mouth daily for 30 days.   isosorbide mononitrate (IMDUR)  60 MG 24 hr tablet Take 1.5 tablets by mouth daily (Patient taking differently: Take 90 mg by mouth daily. Take 1.5 tablets by mouth daily)   pantoprazole (PROTONIX) 40 MG tablet Take 1 tablet (40 mg total) by mouth daily for 30 days.   simvastatin (ZOCOR) 20 MG tablet Take 1 tablet (20 mg total) by mouth daily.   vitamin B-12 (CYANOCOBALAMIN) 500 MCG tablet Take 500 mcg by mouth daily.   [DISCONTINUED] simvastatin (ZOCOR) 20 MG tablet TAKE 1 TABLET BY MOUTH DAILY (Patient taking differently: Take 20 mg by mouth daily. )     Allergies:   Phenazopyridine and Ramipril   Social History   Tobacco Use   Smoking status: Former Smoker    Last attempt to quit: 03/15/1985    Years since quitting: 33.2   Smokeless tobacco: Never Used  Substance Use Topics   Alcohol use: No    Comment: occas.   Drug use: No     Family Hx: The patient's family history includes Hypertension in his father.  ROS:   Please see the history of present illness.     All other systems reviewed and are negative.   Labs/Other Tests and Data Reviewed:    Recent Labs: 05/25/2018: ALT 16; B Natriuretic Peptide 292.5 05/26/2018: Magnesium 2.0 05/28/2018: BUN 32; Creatinine, Ser 1.73; Hemoglobin 8.6; Platelets 185; Potassium 4.1; Sodium 138   Recent Lipid Panel Lab Results  Component Value Date/Time   CHOL 92 11/01/2015 03:31 AM   CHOL 139 11/20/2011 06:24 AM   TRIG 76 11/01/2015 03:31 AM   TRIG 108 11/20/2011 06:24 AM   HDL 31 (L) 11/01/2015 03:31 AM   HDL 26 (L) 11/20/2011 06:24 AM   CHOLHDL 3.0 11/01/2015 03:31 AM   LDLCALC 46 11/01/2015 03:31 AM   LDLCALC 91 11/20/2011 06:24 AM    Wt Readings from Last 3 Encounters:  05/28/18 220 lb 11.2 oz (100.1 kg)  05/25/18 233 lb 12.8 oz (106.1 kg)  05/19/18 180 lb (81.6 kg)     Exam:    Vital Signs:  BP 128/69    Pulse 95  Weight 223 lbs.   Unable to assess via telephone visit through 3rd party interpretor.   ASSESSMENT & PLAN:    1.  Chronic  Mixed CHF: He is confused about the medications that he should be on and not be on. Based up the discharge summary, he was to hold the lasix, HCTZ and losartan. He stated that he was told to take the lasix and buy compression hose. This is not indicated in the discharge summary.  He denies symptoms.   Due to the confusion over is medications, and need for clarification of instructions and med list, I have asked that he be seen in the office for in person  review. He will fair better with 1:1 interaction and assessment instead of over the phone    2. CAD: Hx of CABG with multiple PCI Denies symptoms.   3. Persistent Atrial Fib: Meds will be reviewed when seen in the office.    COVID-19 Education: The signs and symptoms of COVID-19 were discussed with the patient and how to seek care for testing (follow up with PCP or arrange E-visit).  The importance of social distancing was discussed today.  Patient Risk:   After full review of this patients clinical status, I feel that they are at least moderate risk at this time.  Time:   Today, I have spent 15 minutes with the patient with telehealth technology discussing his status, and confusion of his medications.     Medication Adjustments/Labs and Tests Ordered: Current medicines are reviewed at length with the patient today.  Concerns regarding medicines are outlined above.   Tests Ordered: No orders of the defined types were placed in this encounter.  Medication Changes: Meds ordered this encounter  Medications   simvastatin (ZOCOR) 20 MG tablet    Sig: Take 1 tablet (20 mg total) by mouth daily.    Dispense:  90 tablet    Refill:  2    Disposition:  To have office visit this week to review and assess his medications and make a treatment plan.   Signed, Phill Myron. [place] Pugh, ANP, AACC   06/14/2018 2:28 PM    [facility] [place] [address], [place], [place]  [address] Phone: [phone]; Fax: [phone]

## 2018-06-14 ENCOUNTER — Telehealth: Payer: Self-pay | Admitting: Adult Health

## 2018-06-14 ENCOUNTER — Other Ambulatory Visit: Payer: Self-pay

## 2018-06-14 ENCOUNTER — Telehealth: Payer: Self-pay

## 2018-06-14 ENCOUNTER — Ambulatory Visit: Payer: 59 | Admitting: Adult Health

## 2018-06-14 ENCOUNTER — Telehealth (INDEPENDENT_AMBULATORY_CARE_PROVIDER_SITE_OTHER): Payer: PPO | Admitting: Adult Health

## 2018-06-14 VITALS — BP 128/69 | HR 95

## 2018-06-14 DIAGNOSIS — I5022 Chronic systolic (congestive) heart failure: Secondary | ICD-10-CM

## 2018-06-14 DIAGNOSIS — K922 Gastrointestinal hemorrhage, unspecified: Secondary | ICD-10-CM | POA: Diagnosis not present

## 2018-06-14 DIAGNOSIS — I43 Cardiomyopathy in diseases classified elsewhere: Secondary | ICD-10-CM

## 2018-06-14 DIAGNOSIS — I2510 Atherosclerotic heart disease of native coronary artery without angina pectoris: Secondary | ICD-10-CM | POA: Diagnosis not present

## 2018-06-14 MED ORDER — SIMVASTATIN 20 MG PO TABS: 20.0000 mg | ORAL_TABLET | Freq: Every day | ORAL | 2 refills | Status: DC

## 2018-06-14 NOTE — Telephone Encounter (Signed)
Per Thomas [name] pt needs to be seen in the office this week. He is deaf and has recently been discharged from the hospital and is very confused about his medications. Per KL she tried to straighten this out over the phone buy d/t the teletype this is hard to do. Please call and schedule an appt with someone in the office and please arranger for in person interpreter. OK to add to you sched thursThank you

## 2018-06-14 NOTE — Patient Instructions (Addendum)
Medication Instructions:  NO CHANGES- Your physician recommends that you continue on your current medications as directed. Please refer to the Current Medication list given to you today. If you need a refill on your cardiac medications before your next appointment, please call your pharmacy.  Labwork: NONE ORDERED Take the provided lab slips with you to the lab for your blood draw.   When you have your labs (blood work) drawn today and your tests are completely normal, you will receive your results only by MyChart Message (if you have MyChart) -OR-  A paper copy in the mail.  If you have any lab test that is abnormal or we need to change your treatment, we will call you to review these results.  Follow-Up: You will need a follow up appointment THIS WEEK. You may see Sanda Klein, MD, OR DOD PROVIDER or one of the following Advanced Practice Providers on your designated Care Team:  Rosaria Ferries, PA-C   Jory Sims, DNP, ANP      At [facility], you and your health needs are our priority.  As part of our continuing mission to provide you with exceptional heart care, we have created designated Provider Care Teams.  These Care Teams include your primary Cardiologist (physician) and Advanced Practice Providers (APPs -  Physician Assistants and Nurse Practitioners) who all work together to provide you with the care you need, when you need it.  Thank you for choosing [facility] at [facility]!!

## 2018-06-14 NOTE — Telephone Encounter (Signed)
NOTED

## 2018-06-14 NOTE — Telephone Encounter (Signed)
Returned call to pt informed of Curt Bears wanting pt to be seen in the office and that appt was scheduled 4-2 @120pm  with Dr [place]. Informed pt of current office protocol/screening outside office. Pt states that he will be there at scheduled time.

## 2018-06-14 NOTE — Telephone Encounter (Signed)
New Message:   Patient returning call back for his appt. Please call patient back.

## 2018-06-16 ENCOUNTER — Ambulatory Visit (INDEPENDENT_AMBULATORY_CARE_PROVIDER_SITE_OTHER): Payer: 59 | Admitting: Cardiology

## 2018-06-16 ENCOUNTER — Encounter: Payer: Self-pay | Admitting: Cardiology

## 2018-06-16 ENCOUNTER — Other Ambulatory Visit: Payer: Self-pay

## 2018-06-16 VITALS — BP 138/76 | HR 78 | Ht 74.0 in | Wt 234.0 lb

## 2018-06-16 DIAGNOSIS — I5022 Chronic systolic (congestive) heart failure: Secondary | ICD-10-CM | POA: Diagnosis not present

## 2018-06-16 DIAGNOSIS — N183 Chronic kidney disease, stage 3 unspecified: Secondary | ICD-10-CM

## 2018-06-16 DIAGNOSIS — K922 Gastrointestinal hemorrhage, unspecified: Secondary | ICD-10-CM

## 2018-06-16 DIAGNOSIS — I4819 Other persistent atrial fibrillation: Secondary | ICD-10-CM | POA: Diagnosis not present

## 2018-06-16 NOTE — Progress Notes (Signed)
Cardiology Office Note   Date:  06/16/2018   ID:  Thomas [name], Thomas [name] 11-Mar-1935, MRN [id number]  PCP:  Thomas Pink, MD  Cardiologist:  Thomas Klein MD  Chief Complaint  [name] presents with  . Follow-up    Discuss medications.      History of Present Illness: Thomas [name] is a 84 y.o. male who presents post hospital follow up for Afib and CHF. He is seen with his wife who is also deaf and a sign language interpreter. He had a virtual visit with Jory Sims NP on 06/14/18 but due to difficulty with sign language interpretation it was felt it would be best to have a person to person visit today. He has a history of CAD s/p CABG with multiple PCI, BiV ICD, AAA, bleeding ulcers, persistent atrial fib, remote CVA and mixed CHF, with ICM, EF of 35%-40% per recent echo in 04/2018.   He was admitted to [facility] on 05/25/2018 after being seen  with near syncope, cough, and dyspnea. He was found to be anemic (Hgb of 8) with positive FOBT. He was also treated for CHF with IV lasix. There was some thought that his edema was more chronic related to low albumin. HCTZ and losartan were held due to rising creatinine. Peak 1.73. He was placed on po lasix.  Eliquis was put on hold in Thomas setting of anemia. upper EGD shows mild erythema in Thomas duodenum. GI felt Eliquis could be resumed in one week. If he had further evidence of bleeding colonoscopy could be considered as outpatient. Eliquis to be resumed at 2.5 mg bid due to age and renal function. He was started on po iron.  His weight on discharge was 101.1 kg ( 222.4 lbs)  He did have follow up lab work on 05/31/18. Creatinine was down to 1.1. Hgb improved to 9.2. potassium was normal. Albumin was 3.1. HCTZ and losartan were resumed at that time.  Today he reports that he is feeling well. His SOB and fatigue have resolved. He still has LE edema but this is chronic and unchanged. He is back working - does light work. Tolerating his  medication well.     Past Medical History:  Diagnosis Date  . AICD (automatic cardioverter/defibrillator) present   . Biventricular ICD (implantable cardioverter-defibrillator) in place 03/24/2005   Implantation of a Medtronic Adapta ADDRO1, serial number [id number]  . CHF (congestive heart failure) ([place])   . CKD (chronic kidney disease), stage III ([place])   . Coronary artery disease    a. s/p CABG 1986. b. Multiple PCIs/caths. c. 09/2013: s/p PTCA and BMS to SVG-OM.  [name]   . Dysrhythmia   . History of abdominal aortic aneurysm   . History of bleeding peptic ulcer 1980  . History of epididymitis 2013  . HTN (hypertension)   . Hydronephrosis with ureteropelvic junction obstruction   . Hydroureter on left 2009  . Hypertension   . Ischemic cardiomyopathy    a. Prior EF 30-35%, s/p BIV-ICD. b. 09/2013: EF 45-50%.  . Moderate tricuspid regurgitation   . PAF (paroxysmal atrial fibrillation) ([place])   . Presence of permanent cardiac pacemaker   . Prostate cancer ([place])   . Status post coronary artery bypass grafting 1986   LIMA to Thomas LAD, SVG to OM, SVG to RCA  . Testicular swelling     Past Surgical History:  Procedure Laterality Date  . 2-D echocardiogram  11/20/2011   Ejection fraction 30-35% moderate concentric left  ventricular hypertrophy. Left atrium is moderately dilated. Mild MR. Mild or  . BI-VENTRICULAR IMPLANTABLE CARDIOVERTER DEFIBRILLATOR N/A 12/16/2012   Procedure: BI-VENTRICULAR IMPLANTABLE CARDIOVERTER DEFIBRILLATOR  (CRT-D);  Surgeon: [name], MD;  Location: [facility] CATH LAB;  Service: Cardiovascular;  Laterality: N/A;  . CARDIAC CATHETERIZATION  12/10/2011   SVG to OM widely patent.  LIMA to LAD patent  . CATARACT EXTRACTION W/PHACO Right 10/12/2017   Procedure: CATARACT EXTRACTION PHACO AND INTRAOCULAR LENS PLACEMENT (IOC);  Surgeon: Birder Robson, MD;  Location: [facility] ORS;  Service: Ophthalmology;  Laterality: Right;  [place] 00:57 AP% 15.9 CDE 9.07 Fluid pack lot  # [id number]  . CORONARY ARTERY BYPASS GRAFT  1986  . ESOPHAGOGASTRODUODENOSCOPY (EGD) WITH PROPOFOL N/A 05/27/2018   Procedure: ESOPHAGOGASTRODUODENOSCOPY (EGD) WITH PROPOFOL;  Surgeon: Clarene Essex, MD;  Location: [facility];  Service: Endoscopy;  Laterality: N/A;  . INSERT / REPLACE / REMOVE PACEMAKER    . LEFT HEART CATHETERIZATION WITH CORONARY/GRAFT ANGIOGRAM N/A 12/10/2011   Procedure: LEFT HEART CATHETERIZATION WITH Beatrix Fetters;  Surgeon: Thomas Klein, MD;  Location: [facility] CATH LAB;  Service: Cardiovascular;  Laterality: N/A;  . LEFT HEART CATHETERIZATION WITH CORONARY/GRAFT ANGIOGRAM N/A 09/25/2013   Procedure: LEFT HEART CATHETERIZATION WITH Beatrix Fetters;  Surgeon: Blane Ohara, MD;  Location: [facility] CATH LAB;  Service: Cardiovascular;  Laterality: N/A;  . Persantine Myoview  05/06/2010   Post-rest ejection fraction 30%. No significant ischemia demonstrated. Compared to previous study there is no significant change.  . TRANSURETHRAL RESECTION OF PROSTATE     s/p     Current Outpatient Medications  Medication Sig Dispense Refill  . albuterol (PROVENTIL HFA;VENTOLIN HFA) 108 (90 Base) MCG/ACT inhaler Inhale 2 puffs into Thomas lungs 4 (four) times daily as needed.     [name] apixaban (ELIQUIS) 2.5 MG TABS tablet Take 1 tablet (2.5 mg total) by mouth 2 (two) times daily for 30 days. (start on 3/21.  Please review dosing with your PCP prior to restarting) 60 tablet 0  . carvedilol (COREG) 12.5 MG tablet Take 12.5 mg by mouth daily.    . ferrous sulfate 325 (65 FE) MG tablet Take 1 tablet (325 mg total) by mouth daily for 30 days. 30 tablet 0  . HYDROCORTISONE, TOPICAL, 2 % LOTN Apply 40 oz topically 2 (two) times daily. APPLY BID TO AFFECTED AREA 1 Bottle 0  . isosorbide mononitrate (IMDUR) 60 MG 24 hr tablet Take 1.5 tablets by mouth daily ([name] taking differently: Take 90 mg by mouth daily. Take 1.5 tablets by mouth daily) 135 tablet 0  . pantoprazole (PROTONIX) 40 MG  tablet Take 1 tablet (40 mg total) by mouth daily for 30 days. 30 tablet 0  . simvastatin (ZOCOR) 20 MG tablet Take 1 tablet (20 mg total) by mouth daily. 90 tablet 2  . vitamin B-12 (CYANOCOBALAMIN) 500 MCG tablet Take 500 mcg by mouth daily.     No current facility-administered medications for this visit.     Allergies:   Phenazopyridine and Ramipril    Social History:  Thomas [name]  reports that he quit smoking about 33 years ago. He has never used smokeless tobacco. He reports that he does not drink alcohol or use drugs.   Family History:  Thomas [name]'s family history includes Hypertension in his father.    ROS:  Please see Thomas history of present illness.   Otherwise, review of systems are positive for none.   All other systems are reviewed and negative.    PHYSICAL EXAM: VS:  BP 138/76 (BP Location: Left Arm, [name] Position: Sitting, Cuff Size: Normal)   Pulse 78   Ht 6\' 2"  (1.88 m)   Wt 234 lb (106.1 kg)   BMI 30.04 kg/m  , BMI Body mass index is 30.04 kg/m. GEN: Well nourished, well developed, in no acute distress  HEENT: normal  Neck: no JVD, prominent V waves. No carotid bruits, or masses Cardiac: IRRR; gr 1-6/1 holosystolic murmur at Thomas apex. No  rubs, or gallops. 2+ LE edema. Respiratory:  clear to auscultation bilaterally, normal work of breathing GI: soft, nontender, nondistended, + BS MS: no deformity or atrophy  Skin: warm and dry, no rash Neuro:  Strength and sensation are intact Psych: euthymic mood, full affect   EKG:  EKG is not ordered today. Thomas ekg ordered today demonstrates N/A   Recent Labs: 05/25/2018: ALT 16; B Natriuretic Peptide 292.5 05/26/2018: Magnesium 2.0 05/28/2018: BUN 32; Creatinine, Ser 1.73; Hemoglobin 8.6; Platelets 185; Potassium 4.1; Sodium 138    Lipid Panel    Component Value Date/Time   CHOL 92 11/01/2015 0331   CHOL 139 11/20/2011 0624   TRIG 76 11/01/2015 0331   TRIG 108 11/20/2011 0624   HDL 31 (L) 11/01/2015 0331    HDL 26 (L) 11/20/2011 0624   CHOLHDL 3.0 11/01/2015 0331   VLDL 15 11/01/2015 0331   VLDL 22 11/20/2011 0624   LDLCALC 46 11/01/2015 0331   LDLCALC 91 11/20/2011 0624      Wt Readings from Last 3 Encounters:  06/16/18 234 lb (106.1 kg)  05/28/18 220 lb 11.2 oz (100.1 kg)  05/25/18 233 lb 12.8 oz (106.1 kg)      Other studies Reviewed: Additional studies/ records that were reviewed today include:  Echo 05/02/18: IMPRESSIONS    1. Thomas left ventricle has moderately reduced systolic function, with an ejection fraction of 35-40%. Thomas cavity size was moderately dilated. Left ventricular diastology could not be evaluated.  2. Thomas right ventricle has mildly reduced systolic function. Thomas cavity was moderately enlarged. There is no increase in right ventricular wall thickness.  3. Left atrial size was severely dilated.  4. Right atrial size was severely dilated.  5. Thomas mitral valve is normal in structure. Mitral valve regurgitation is mild to moderate by color flow Doppler. Thomas MR jet is eccentric posteriorly directed.  6. Thomas tricuspid valve is normal in structure.  7. Thomas aortic valve is tricuspid Aortic valve regurgitation is mild by color flow Doppler.  8. Thomas pulmonic valve was normal in structure.  9. There is moderate dilatation of Thomas aortic root and of Thomas ascending aorta. 10. Moderately dilated pulmonary artery. 11. Pulmonary hypertension is moderate. 12. Thomas inferior vena cava was dilated in size with <50% respiratory variability. 13. Right atrial pressure is   ASSESSMENT AND PLAN:  1. Iron deficiency anemia secondary to occult GIB. Improved on iron therapy. Continue protonix. Now back on Eliquis at lower dose of 2.5 mg bid. Follow up with GI.   2. Chronic combined CHF and chronic lower extremity edema with h/o lymphedema - edema is chronic and unchanged per [name] report  Low albumin is likely contributing. On lasix and HCTZ. Recommend feet elevation when possible.  Moderate compression hose.  3. CKD stage III - improved. Now back on HCTZ and losartan.  4. Elevated troponin - low/flat trend, doubt ACS. Due to demand ischemia in setting of anemia, CKD.  5. Persistent atrial fib- rate controlled. Resume Eliquis 2.5 mg bid  Current medicines are reviewed at length with Thomas [name] today.  Thomas [name] does not have concerns regarding medicines.  Thomas following changes have been made:  no change  Labs/ tests ordered today include:  No orders of Thomas defined types were placed in this encounter.    Disposition:   FU with Dr Sallyanne Kuster  in 3 months  Signed, Khadeja Abt Martinique, MD  06/16/2018 1:58 PM    [facility] [address], [place], [place], [address] Phone [phone], Fax [phone]

## 2018-06-16 NOTE — Patient Instructions (Signed)
Medication Instructions:  Continue same medications If you need a refill on your cardiac medications before your next appointment, please call your pharmacy.   Lab work: None ordered   Testing/Procedures: None ordered  Follow-Up: At [facility], you and your health needs are our priority.  As part of our continuing mission to provide you with exceptional heart care, we have created designated Provider Care Teams.  These Care Teams include your primary Cardiologist (physician) and Advanced Practice Providers (APPs -  Physician Assistants and Nurse Practitioners) who all work together to provide you with the care you need, when you need it. . Schedule appointment with Dr.Croitoru in 3 months

## 2018-06-23 DIAGNOSIS — R195 Other fecal abnormalities: Secondary | ICD-10-CM | POA: Diagnosis not present

## 2018-06-23 DIAGNOSIS — Z7901 Long term (current) use of anticoagulants: Secondary | ICD-10-CM | POA: Diagnosis not present

## 2018-06-23 DIAGNOSIS — Z95810 Presence of automatic (implantable) cardiac defibrillator: Secondary | ICD-10-CM | POA: Diagnosis not present

## 2018-06-23 DIAGNOSIS — H9193 Unspecified hearing loss, bilateral: Secondary | ICD-10-CM | POA: Diagnosis not present

## 2018-06-23 DIAGNOSIS — I6381 Other cerebral infarction due to occlusion or stenosis of small artery: Secondary | ICD-10-CM | POA: Diagnosis not present

## 2018-06-23 DIAGNOSIS — Z950 Presence of cardiac pacemaker: Secondary | ICD-10-CM | POA: Diagnosis not present

## 2018-06-23 DIAGNOSIS — Z8719 Personal history of other diseases of the digestive system: Secondary | ICD-10-CM | POA: Diagnosis not present

## 2018-06-23 DIAGNOSIS — D500 Iron deficiency anemia secondary to blood loss (chronic): Secondary | ICD-10-CM | POA: Diagnosis not present

## 2018-06-23 DIAGNOSIS — I480 Paroxysmal atrial fibrillation: Secondary | ICD-10-CM | POA: Diagnosis not present

## 2018-06-23 DIAGNOSIS — K219 Gastro-esophageal reflux disease without esophagitis: Secondary | ICD-10-CM | POA: Diagnosis not present

## 2018-06-24 ENCOUNTER — Telehealth: Payer: Self-pay | Admitting: *Deleted

## 2018-06-24 NOTE — Telephone Encounter (Signed)
   [facility] Pre-operative Risk Assessment    Request for surgical clearance:  1. What type of surgery is being performed? COLONSCOPY  2. When is this surgery scheduled? STAT  3. What type of clearance is required (medical clearance vs. Pharmacy clearance to hold med vs. Both)? BOTH  4. Are there any medications that need to be held prior to surgery and how long? ELIQUIS  5. Practice name and name of physician performing surgery? [facility] DE[facility] DR Alice Reichert PROCEDURE TO BE DONE AT [facility] 6. What is your office phone number [phone]   7.   What is your office fax number [phone]  8.   Anesthesia type (None, local, MAC, general) ? CHOICE   Devra Dopp 06/24/2018, 8:26 AM  _________________________________________________________________   (provider comments below)

## 2018-06-24 NOTE — Telephone Encounter (Signed)
Patient with diagnosis of afib on Eliquis for anticoagulation.    Procedure: Colonoscopy Date of procedure: STAT  CHADS2-VASc score of  8 (CHF, HTN, AGE, DM2, stroke/tia x 2, CAD, AGE, male)  CrCl 43 ml/min  Per office protocol, patient can hold Eliquis for 1 day prior to procedure.

## 2018-06-24 NOTE — Telephone Encounter (Signed)
   Primary Cardiologist: Sanda Klein, MD  Chart reviewed as part of pre-operative protocol coverage. Patient was contacted 06/24/2018 in reference to pre-operative risk assessment for pending surgery as outlined below.  ZOLTAN GENEST was last seen on 06/16/2018 by Dr. Peter Martinique and was doing well from a cardiac perspective.  Since that day, KOEHN SALEHI is back to light work without complication.  Therefore, based on ACC/AHA guidelines, the patient would be at acceptable risk for the planned procedure without further cardiovascular testing.   Per pharmacy: Patient with diagnosis of afib on Eliquis for anticoagulation.    Procedure: Colonoscopy Date of procedure: STAT  CHADS2-VASc score of  8 (CHF, HTN, AGE, DM2, stroke/tia x 2, CAD, AGE, male)  CrCl 43 ml/min  Per office protocol, patient can hold Eliquis for 1 day prior to procedure.     I will route this recommendation to the requesting party via Epic fax function and remove from pre-op pool.  Please call with questions.  Kathyrn Drown, NP 06/24/2018, 9:25 AM

## 2018-07-01 DIAGNOSIS — D509 Iron deficiency anemia, unspecified: Secondary | ICD-10-CM | POA: Diagnosis not present

## 2018-07-04 ENCOUNTER — Telehealth: Payer: Self-pay | Admitting: Cardiology

## 2018-07-04 NOTE — Telephone Encounter (Signed)
I re-faxed at the number given. Please call to confirm receipt.

## 2018-07-04 NOTE — Telephone Encounter (Signed)
Follow Up:   Robyn from Dr Ricky Stabs [facility] calling to check on the status of the clearance from 06-23-18. Please fax to [phone].

## 2018-07-06 ENCOUNTER — Other Ambulatory Visit: Payer: Self-pay | Admitting: Cardiovascular Disease

## 2018-07-06 ENCOUNTER — Telehealth: Payer: Self-pay | Admitting: Internal Medicine

## 2018-07-06 NOTE — Telephone Encounter (Signed)
New message   Changed patient visit on 04.30.20 to a phone visit. Pt can use interpreter phone when they receive phone call. Phone number is listed in the notes.      Virtual Visit Pre-Appointment Phone Call  "(Name), I am calling you today to discuss your upcoming appointment. We are currently trying to limit exposure to the virus that causes COVID-19 by seeing patients at home rather than in the office."  1. "What is the BEST phone number to call the day of the visit?" - include this in appointment notes  2. Do you have or have access to (through a family member/friend) a smartphone with video capability that we can use for your visit?" a. If yes - list this number in appt notes as cell (if different from BEST phone #) and list the appointment type as a VIDEO visit in appointment notes b. If no - list the appointment type as a PHONE visit in appointment notes  3. Confirm consent - "In the setting of the current Covid19 crisis, you are scheduled for a (phone or video) visit with your provider on (date) at (time).  Just as we do with many in-office visits, in order for you to participate in this visit, we must obtain consent.  If you'd like, I can send this to your mychart (if signed up) or email for you to review.  Otherwise, I can obtain your verbal consent now.  All virtual visits are billed to your insurance company just like a normal visit would be.  By agreeing to a virtual visit, we'd like you to understand that the technology does not allow for your provider to perform an examination, and thus may limit your provider's ability to fully assess your condition. If your provider identifies any concerns that need to be evaluated in person, we will make arrangements to do so.  Finally, though the technology is pretty good, we cannot assure that it will always work on either your or our end, and in the setting of a video visit, we may have to convert it to a phone-only visit.  In either  situation, we cannot ensure that we have a secure connection.  Are you willing to proceed?" STAFF: Did the patient verbally acknowledge consent to telehealth visit? Document YES/NO here: YES  4. Advise patient to be prepared - "Two hours prior to your appointment, go ahead and check your blood pressure, pulse, oxygen saturation, and your weight (if you have the equipment to check those) and write them all down. When your visit starts, your provider will ask you for this information. If you have an Apple Watch or Kardia device, please plan to have heart rate information ready on the day of your appointment. Please have a pen and paper handy nearby the day of the visit as well."  5. Give patient instructions for MyChart download to smartphone OR [email or web address] as below if video visit (depending on what platform provider is using)  6. Inform patient they will receive a phone call 15 minutes prior to their appointment time (may be from unknown caller ID) so they should be prepared to answer    TELEPHONE CALL NOTE  Thomas [name] has been deemed a candidate for a follow-up tele-health visit to limit community exposure during the Covid-19 pandemic. I spoke with the patient via phone to ensure availability of phone/video source, confirm preferred email & phone number, and discuss instructions and expectations.  I reminded Thomas [name] to be prepared with  any vital sign and/or heart rhythm information that could potentially be obtained via home monitoring, at the time of his visit. I reminded Thomas [name] to expect a phone call prior to his visit.  Thomas [name] 07/06/2018 4:29 PM   INSTRUCTIONS FOR DOWNLOADING THE MYCHART APP TO SMARTPHONE  - The patient must first make sure to have activated MyChart and know their login information - If Apple, go to [employer] and type in MyChart in the search bar and download the app. If Android, ask patient to go to [employer] and type in [place] in  the search bar and download the app. The app is free but as with any other app downloads, their phone may require them to verify saved payment information or Apple/Android password.  - The patient will need to then log into the app with their MyChart username and password, and select [facility] as their healthcare provider to link the account. When it is time for your visit, go to the MyChart app, find appointments, and click Begin Video Visit. Be sure to Select Allow for your device to access the Microphone and Camera for your visit. You will then be connected, and your provider will be with you shortly.  **If they have any issues connecting, or need assistance please contact MyChart service desk [phone])**  **If using a computer, in order to ensure the best quality for their visit they will need to use either of the following Internet Browsers: [employer], or Google Chrome**  IF USING DOXIMITY or DOXY.ME - The patient will receive a link just prior to their visit by text.     FULL LENGTH CONSENT FOR TELE-HEALTH VISIT   I hereby voluntarily request, consent and authorize [facility] and its employed or contracted physicians, physician assistants, nurse practitioners or other licensed health care professionals (the Practitioner), to provide me with telemedicine health care services (the Services") as deemed necessary by the treating Practitioner. I acknowledge and consent to receive the Services by the Practitioner via telemedicine. I understand that the telemedicine visit will involve communicating with the Practitioner through live audiovisual communication technology and the disclosure of certain medical information by electronic transmission. I acknowledge that I have been given the opportunity to request an in-person assessment or other available alternative prior to the telemedicine visit and am voluntarily participating in the telemedicine visit.  I understand that  I have the right to withhold or withdraw my consent to the use of telemedicine in the course of my care at any time, without affecting my right to future care or treatment, and that the Practitioner or I may terminate the telemedicine visit at any time. I understand that I have the right to inspect all information obtained and/or recorded in the course of the telemedicine visit and may receive copies of available information for a reasonable fee.  I understand that some of the potential risks of receiving the Services via telemedicine include:   Delay or interruption in medical evaluation due to technological equipment failure or disruption;  Information transmitted may not be sufficient (e.g. poor resolution of images) to allow for appropriate medical decision making by the Practitioner; and/or   In rare instances, security protocols could fail, causing a breach of personal health information.  Furthermore, I acknowledge that it is my responsibility to provide information about my medical history, conditions and care that is complete and accurate to the best of my ability. I acknowledge that Practitioner's advice, recommendations, and/or  decision may be based on factors not within their control, such as incomplete or inaccurate data provided by me or distortions of diagnostic images or specimens that may result from electronic transmissions. I understand that the practice of medicine is not an exact science and that Practitioner makes no warranties or guarantees regarding treatment outcomes. I acknowledge that I will receive a copy of this consent concurrently upon execution via email to the email address I last provided but may also request a printed copy by calling the office of [facility].    I understand that my insurance will be billed for this visit.   I have read or had this consent read to me.  I understand the contents of this consent, which adequately explains the benefits and risks of  the Services being provided via telemedicine.   I have been provided ample opportunity to ask questions regarding this consent and the Services and have had my questions answered to my satisfaction.  I give my informed consent for the services to be provided through the use of telemedicine in my medical care  By participating in this telemedicine visit I agree to the above.

## 2018-07-11 ENCOUNTER — Ambulatory Visit: Payer: 59 | Admitting: Family

## 2018-07-12 ENCOUNTER — Encounter: Payer: Self-pay | Admitting: *Deleted

## 2018-07-13 ENCOUNTER — Ambulatory Visit: Payer: PPO | Admitting: Registered Nurse

## 2018-07-13 ENCOUNTER — Encounter: Payer: Self-pay | Admitting: *Deleted

## 2018-07-13 ENCOUNTER — Ambulatory Visit
Admission: RE | Admit: 2018-07-13 | Discharge: 2018-07-13 | Disposition: A | Discharge status: Home / Self Care | Payer: PPO | Attending: Internal Medicine | Admitting: Internal Medicine

## 2018-07-13 ENCOUNTER — Encounter
Admission: RE | Disposition: A | Discharge status: Home / Self Care | Payer: Self-pay | Source: Home / Self Care | Attending: Internal Medicine

## 2018-07-13 ENCOUNTER — Other Ambulatory Visit: Payer: Self-pay

## 2018-07-13 DIAGNOSIS — Z95810 Presence of automatic (implantable) cardiac defibrillator: Secondary | ICD-10-CM | POA: Diagnosis not present

## 2018-07-13 DIAGNOSIS — N183 Chronic kidney disease, stage 3 (moderate): Secondary | ICD-10-CM | POA: Insufficient documentation

## 2018-07-13 DIAGNOSIS — I480 Paroxysmal atrial fibrillation: Secondary | ICD-10-CM | POA: Diagnosis not present

## 2018-07-13 DIAGNOSIS — Z79899 Other long term (current) drug therapy: Secondary | ICD-10-CM | POA: Insufficient documentation

## 2018-07-13 DIAGNOSIS — Z87891 Personal history of nicotine dependence: Secondary | ICD-10-CM | POA: Insufficient documentation

## 2018-07-13 DIAGNOSIS — K648 Other hemorrhoids: Secondary | ICD-10-CM | POA: Diagnosis not present

## 2018-07-13 DIAGNOSIS — D128 Benign neoplasm of rectum: Secondary | ICD-10-CM | POA: Insufficient documentation

## 2018-07-13 DIAGNOSIS — I509 Heart failure, unspecified: Secondary | ICD-10-CM | POA: Insufficient documentation

## 2018-07-13 DIAGNOSIS — Z8546 Personal history of malignant neoplasm of prostate: Secondary | ICD-10-CM | POA: Insufficient documentation

## 2018-07-13 DIAGNOSIS — I2510 Atherosclerotic heart disease of native coronary artery without angina pectoris: Secondary | ICD-10-CM | POA: Diagnosis not present

## 2018-07-13 DIAGNOSIS — I5043 Acute on chronic combined systolic (congestive) and diastolic (congestive) heart failure: Secondary | ICD-10-CM | POA: Diagnosis not present

## 2018-07-13 DIAGNOSIS — K31819 Angiodysplasia of stomach and duodenum without bleeding: Secondary | ICD-10-CM | POA: Diagnosis not present

## 2018-07-13 DIAGNOSIS — I130 Hypertensive heart and chronic kidney disease with heart failure and stage 1 through stage 4 chronic kidney disease, or unspecified chronic kidney disease: Secondary | ICD-10-CM | POA: Insufficient documentation

## 2018-07-13 DIAGNOSIS — K2990 Gastroduodenitis, unspecified, without bleeding: Secondary | ICD-10-CM | POA: Diagnosis not present

## 2018-07-13 DIAGNOSIS — K641 Second degree hemorrhoids: Secondary | ICD-10-CM | POA: Diagnosis not present

## 2018-07-13 DIAGNOSIS — K2970 Gastritis, unspecified, without bleeding: Secondary | ICD-10-CM | POA: Insufficient documentation

## 2018-07-13 DIAGNOSIS — K921 Melena: Secondary | ICD-10-CM | POA: Diagnosis not present

## 2018-07-13 DIAGNOSIS — I252 Old myocardial infarction: Secondary | ICD-10-CM | POA: Diagnosis not present

## 2018-07-13 DIAGNOSIS — K621 Rectal polyp: Secondary | ICD-10-CM | POA: Diagnosis not present

## 2018-07-13 DIAGNOSIS — Z951 Presence of aortocoronary bypass graft: Secondary | ICD-10-CM | POA: Insufficient documentation

## 2018-07-13 DIAGNOSIS — Z7901 Long term (current) use of anticoagulants: Secondary | ICD-10-CM | POA: Insufficient documentation

## 2018-07-13 DIAGNOSIS — Z888 Allergy status to other drugs, medicaments and biological substances status: Secondary | ICD-10-CM | POA: Insufficient documentation

## 2018-07-13 DIAGNOSIS — H9190 Unspecified hearing loss, unspecified ear: Secondary | ICD-10-CM | POA: Diagnosis not present

## 2018-07-13 DIAGNOSIS — D500 Iron deficiency anemia secondary to blood loss (chronic): Secondary | ICD-10-CM | POA: Diagnosis not present

## 2018-07-13 DIAGNOSIS — R195 Other fecal abnormalities: Secondary | ICD-10-CM | POA: Diagnosis not present

## 2018-07-13 DIAGNOSIS — I255 Ischemic cardiomyopathy: Secondary | ICD-10-CM | POA: Insufficient documentation

## 2018-07-13 HISTORY — PX: ESOPHAGOGASTRODUODENOSCOPY: SHX5428

## 2018-07-13 HISTORY — PX: COLONOSCOPY: SHX5424

## 2018-07-13 SURGERY — EGD (ESOPHAGOGASTRODUODENOSCOPY)
Anesthesia: General

## 2018-07-13 MED ORDER — PROPOFOL 10 MG/ML IV BOLUS
INTRAVENOUS | Status: DC | PRN
  Administered 2018-07-13: 60 mg via INTRAVENOUS

## 2018-07-13 MED ORDER — PROPOFOL 10 MG/ML IV BOLUS
INTRAVENOUS | Status: AC
  Filled 2018-07-13: qty 20

## 2018-07-13 MED ORDER — SODIUM CHLORIDE 0.9 % IV SOLN
INTRAVENOUS | Status: DC
  Administered 2018-07-13: 08:00:00 via INTRAVENOUS

## 2018-07-13 MED ORDER — PROPOFOL 500 MG/50ML IV EMUL
INTRAVENOUS | Status: DC | PRN
  Administered 2018-07-13: 140 ug/kg/min via INTRAVENOUS

## 2018-07-13 MED ORDER — PHENYLEPHRINE HCL (PRESSORS) 10 MG/ML IV SOLN
INTRAVENOUS | Status: DC | PRN
  Administered 2018-07-13: 100 ug via INTRAVENOUS
  Administered 2018-07-13: 200 ug via INTRAVENOUS
  Administered 2018-07-13: 100 ug via INTRAVENOUS
  Administered 2018-07-13: 250 ug via INTRAVENOUS
  Administered 2018-07-13: 100 ug via INTRAVENOUS

## 2018-07-13 MED ORDER — PROPOFOL 500 MG/50ML IV EMUL
INTRAVENOUS | Status: AC
  Filled 2018-07-13: qty 50

## 2018-07-13 NOTE — Transfer of Care (Signed)
Immediate Anesthesia Transfer of Care Note  Patient: HULET EHRMANN  Procedure(s) Performed: ESOPHAGOGASTRODUODENOSCOPY (EGD) (N/A ) COLONOSCOPY (N/A )  Patient Location: PACU  Anesthesia Type:General  Level of Consciousness: sedated  Airway & Oxygen Therapy: Patient Spontanous Breathing and Patient connected to nasal cannula oxygen  Post-op Assessment: Report given to RN and Post -op Vital signs reviewed and stable  Post vital signs: Reviewed and stable  Last Vitals:  Vitals Value Taken Time  BP 95/47 07/13/2018  9:38 AM  Temp 36.3 C 07/13/2018  9:38 AM  Pulse 68 07/13/2018  9:38 AM  Resp 17 07/13/2018  9:38 AM  SpO2 95 % 07/13/2018  9:38 AM    Last Pain:  Vitals:   07/13/18 0937  TempSrc: Tympanic  PainSc: Asleep         Complications: No apparent anesthesia complications

## 2018-07-13 NOTE — Op Note (Signed)
[facility] Patient Name: Thomas [name] Procedure Date: 07/13/2018 7:53 AM MRN: [id number] Account #: [id number] Date of Birth: 02-14-1935 Admit Type: Outpatient Age: 84 Room: [facility] ENDO ROOM 4 Gender: Male Note Status: Finalized Procedure:            Colonoscopy Indications:          Heme positive stool, Melena, Iron deficiency anemia                        secondary to chronic blood loss Providers:            Benay Pike. Lemmie Steinhaus MD, MD Medicines:            Propofol per Anesthesia Complications:        No immediate complications. Procedure:            Pre-Anesthesia Assessment:                       - The risks and benefits of the procedure and the                        sedation options and risks were discussed with the                        patient. All questions were answered and informed                        consent was obtained.                       - Patient identification and proposed procedure were                        verified prior to the procedure by the nurse. The                        procedure was verified in the procedure room.                       - ASA Grade Assessment: III - A patient with severe                        systemic disease.                       - After reviewing the risks and benefits, the patient                        was deemed in satisfactory condition to undergo the                        procedure.                       After obtaining informed consent, the colonoscope was                        passed under direct vision. Throughout the procedure,                        the patient's blood pressure, pulse, and oxygen  saturations were monitored continuously. The                        Colonoscope was introduced through the anus and                        advanced to the the cecum, identified by appendiceal                        orifice and ileocecal valve. The colonoscopy was                        technically difficult and complex due to unsatisfactory                        bowel prep. Successful completion of the procedure was                        aided by lavage. The colonoscopy was somewhat difficult                        due to poor endoscopic visualization. Successful                        completion of the procedure was aided by lavage. The                        patient tolerated the procedure well. The quality of                        the bowel preparation was. Findings:      The perianal and digital rectal examinations were normal. Pertinent       negatives include normal sphincter tone and no palpable rectal lesions.      Semi-liquid stool was found in the entire colon, interfering with       visualization. Lavage of the area was performed using a large amount of       tap water, resulting in clearance with fair visualization.      Non-bleeding internal hemorrhoids were found during retroflexion. The       hemorrhoids were Grade II (internal hemorrhoids that prolapse but reduce       spontaneously).      The exam was otherwise without abnormality.      A 6 mm polyp was found in the rectum. The polyp was sessile. The polyp       was removed with a cold snare. Resection and retrieval were complete. Impression:           - Stool in the entire examined colon.                       - Non-bleeding internal hemorrhoids.                       - The examination was otherwise normal.                       - No specimens collected. Recommendation:       - Patient has a contact number available for  emergencies. The signs and symptoms of potential                        delayed complications were discussed with the patient.                        Return to normal activities tomorrow. Written discharge                        instructions were provided to the patient.                       - Resume previous diet.                       -  Continue present medications.                       - Repeat colonoscopy date to be determined after                        pending pathology results are reviewed for surveillance.                       - Return to my office in 2 months.                       - To visualize the small bowel, perform video capsule                        endoscopy at appointment to be scheduled. Procedure Code(s):    --- Professional ---                       [phone], Colonoscopy, flexible; with removal of tumor(s),                        polyp(s), or other lesion(s) by snare technique Diagnosis Code(s):    --- Professional ---                       D50.0, Iron deficiency anemia secondary to blood loss                        (chronic)                       K92.1, Melena (includes Hematochezia)                       R19.5, Other fecal abnormalities                       K64.1, Second degree hemorrhoids CPT copyright 2019 American Medical Association. All rights reserved. The codes documented in this report are preliminary and upon coder review may  be revised to meet current compliance requirements. Efrain Sella MD, MD 07/13/2018 9:35:39 AM This report has been signed electronically. Number of Addenda: 0 Note Initiated On: 07/13/2018 7:53 AM Scope Withdrawal Time: 0 hours 11 minutes 45 seconds  Total Procedure Duration: 0 hours 18 minutes 27 seconds       [facility]

## 2018-07-13 NOTE — Interval H&P Note (Signed)
History and Physical Interval Note:  07/13/2018 8:45 AM  Thomas [name]  has presented today for surgery, with the diagnosis of occult blood possitive stool.  The various methods of treatment have been discussed with the patient and family. After consideration of risks, benefits and other options for treatment, the patient has consented to  Procedure(s): ESOPHAGOGASTRODUODENOSCOPY (EGD) (N/A) COLONOSCOPY (N/A) as a surgical intervention.  The patient's history has been reviewed, patient examined, no change in status, stable for surgery.  I have reviewed the patient's chart and labs.  Questions were answered to the patient's satisfaction.     [place], [place]

## 2018-07-13 NOTE — Anesthesia Postprocedure Evaluation (Signed)
Anesthesia Post Note  Patient: KIRAN CARLINE  Procedure(s) Performed: ESOPHAGOGASTRODUODENOSCOPY (EGD) (N/A ) COLONOSCOPY (N/A )  Patient location during evaluation: Endoscopy Anesthesia Type: General Level of consciousness: awake and alert and oriented Pain management: pain level controlled Vital Signs Assessment: post-procedure vital signs reviewed and stable Respiratory status: spontaneous breathing, nonlabored ventilation and respiratory function stable Cardiovascular status: blood pressure returned to baseline and stable Postop Assessment: no signs of nausea or vomiting Anesthetic complications: no   Magnet used on ICD during procedure due to electrocautery use, medtronic rep contacted post procedure and states device will revert to previous settings after magnet removed, no need for reprogramming.  Last Vitals:  Vitals:   07/13/18 0947 07/13/18 0957  BP: 112/66 114/66  Pulse: (!) 59 60  Resp: 20 19  Temp:    SpO2: 98% 93%    Last Pain:  Vitals:   07/13/18 0957  TempSrc:   PainSc: 0-No pain                 Lumina Gitto

## 2018-07-13 NOTE — Anesthesia Post-op Follow-up Note (Signed)
Anesthesia QCDR form completed.        

## 2018-07-13 NOTE — Anesthesia Preprocedure Evaluation (Signed)
Anesthesia Evaluation  Patient identified by MRN, date of birth, ID band Patient awake    Reviewed: Allergy & Precautions, NPO status , Patient's Chart, lab work & pertinent test results  History of Anesthesia Complications Negative for: history of anesthetic complications  Airway Mallampati: II  TM Distance: >3 FB Neck ROM: Full    Dental no notable dental hx.    Pulmonary neg sleep apnea, neg COPD, former smoker,    breath sounds clear to auscultation- rhonchi (-) wheezing      Cardiovascular hypertension, Pt. on medications + CAD, + Past MI, + Cardiac Stents, + CABG (1986) and +CHF (EF 35-40% )  + dysrhythmias Atrial Fibrillation + pacemaker + Cardiac Defibrillator  Rhythm:Regular Rate:Normal - Systolic murmurs and - Diastolic murmurs Echo 1/61/09:  1. The left ventricle has moderately reduced systolic function, with an ejection fraction of 35-40%. The cavity size was moderately dilated. Left ventricular diastology could not be evaluated.  2. The right ventricle has mildly reduced systolic function. The cavity was moderately enlarged. There is no increase in right ventricular wall thickness.  3. Left atrial size was severely dilated.  4. Right atrial size was severely dilated.  5. The mitral valve is normal in structure. Mitral valve regurgitation is mild to moderate by color flow Doppler. The MR jet is eccentric posteriorly directed.  6. The tricuspid valve is normal in structure.  7. The aortic valve is tricuspid Aortic valve regurgitation is mild by color flow Doppler.  8. The pulmonic valve was normal in structure.  9. There is moderate dilatation of the aortic root and of the ascending aorta. 10. Moderately dilated pulmonary artery. 11. Pulmonary hypertension is moderate. 12. The inferior vena cava was dilated in size with <50% respiratory variability. 13. Right atrial pressure is estimated at 15 mmHg.   Neuro/Psych neg  Seizures negative neurological ROS  negative psych ROS   GI/Hepatic Neg liver ROS, PUD, GERD  Medicated,  Endo/Other  negative endocrine ROSneg diabetes  Renal/GU Renal InsufficiencyRenal disease     Musculoskeletal negative musculoskeletal ROS (+)   Abdominal (+) - obese,   Peds  Hematology  (+) anemia ,   Anesthesia Other Findings Past Medical History: No date: AICD (automatic cardioverter/defibrillator) present 03/24/2005: Biventricular ICD (implantable cardioverter- defibrillator) in place     Comment:  Implantation of a Medtronic Adapta ADDRO1, serial number              [id number] No date: CHF (congestive heart failure) (HCC) No date: CKD (chronic kidney disease), stage III ([place]) No date: Coronary artery disease     Comment:  a. s/p CABG 1986. b. Multiple PCIs/caths. c. 09/2013: s/p              PTCA and BMS to SVG-OM. No date: [place] No date: Dysrhythmia No date: History of abdominal aortic aneurysm 1980: History of bleeding peptic ulcer 2013: History of epididymitis No date: HTN (hypertension) No date: Hydronephrosis with ureteropelvic junction obstruction 2009: Hydroureter on left No date: Hypertension No date: Ischemic cardiomyopathy     Comment:  a. Prior EF 30-35%, s/p BIV-ICD. b. 09/2013: EF 45-50%. No date: Moderate tricuspid regurgitation No date: PAF (paroxysmal atrial fibrillation) (HCC) No date: Presence of permanent cardiac pacemaker No date: Prostate cancer ([place]) 1986: Status post coronary artery bypass grafting     Comment:  LIMA to the LAD, SVG to OM, SVG to RCA No date: Testicular swelling   Reproductive/Obstetrics  Anesthesia Physical Anesthesia Plan  ASA: III  Anesthesia Plan: General   Post-op Pain Management:    Induction: Intravenous  PONV Risk Score and Plan: 1 and Propofol infusion  Airway Management Planned: Natural Airway  Additional Equipment:   Intra-op Plan:    Post-operative Plan:   Informed Consent: I have reviewed the patients History and Physical, chart, labs and discussed the procedure including the risks, benefits and alternatives for the proposed anesthesia with the patient or authorized representative who has indicated his/her understanding and acceptance.     Dental advisory given  Plan Discussed with: CRNA and Anesthesiologist  Anesthesia Plan Comments:         Anesthesia Quick Evaluation

## 2018-07-13 NOTE — H&P (Signed)
Outpatient short stay form Pre-procedure 07/13/2018 8:40 AM Thomas [name] Thomas [name], M.D.  Primary Physician: Thomas [name], M.D.  Reason for visit: Iron deficiency anemia, hemoccult positive stool, melena.  History of present illness:  Patient is a pleasant 84 y/o male with a history of iron deficiency anemia with symptoms of melena and Hemoccult positive stool.  Melena seems to have resolved.  Patient seen with the assistance of sign language interpreter.  Recent CT scan obtained on April 28, 2018 suggested possible pancreatitis with "early suspected cirrhosis".  Right renal atrophy was noted with a left renal nonobstructing stone.  He is on a decreased dose of Eliquis which he has held for today's procedure.    Current Facility-Administered Medications:  .  0.9 %  sodium chloride infusion, , Intravenous, Continuous, [place], Thomas Pike, MD, Last Rate: 20 mL/hr at 07/13/18 0818  Medications Prior to Admission  Medication Sig Dispense Refill Last Dose  . albuterol (PROVENTIL HFA;VENTOLIN HFA) 108 (90 Base) MCG/ACT inhaler Inhale 2 puffs into the lungs 4 (four) times daily as needed.    Taking  . apixaban (ELIQUIS) 2.5 MG TABS tablet Take 1 tablet (2.5 mg total) by mouth 2 (two) times daily for 30 days. (start on 3/21.  Please review dosing with your PCP prior to restarting) 60 tablet 0 Taking  . apixaban (ELIQUIS) 5 MG TABS tablet Take 1 tablet (5 mg total) by mouth 2 (two) times daily. Please repeat blood work prior to next refill authorization. 180 tablet 0 07/10/2018  . carvedilol (COREG) 12.5 MG tablet Take 12.5 mg by mouth daily.   07/12/2018 at Unknown time  . ferrous sulfate 325 (65 FE) MG tablet Take 1 tablet (325 mg total) by mouth daily for 30 days. 30 tablet 0 Taking  . HYDROCORTISONE, TOPICAL, 2 % LOTN Apply 40 oz topically 2 (two) times daily. APPLY BID TO AFFECTED AREA 1 Bottle 0 Taking  . isosorbide mononitrate (IMDUR) 60 MG 24 hr tablet Take 1.5 tablets by mouth daily (Patient taking  differently: Take 90 mg by mouth daily. Take 1.5 tablets by mouth daily) 135 tablet 0 Taking  . pantoprazole (PROTONIX) 40 MG tablet Take 1 tablet (40 mg total) by mouth daily for 30 days. 30 tablet 0 Taking  . simvastatin (ZOCOR) 20 MG tablet Take 1 tablet (20 mg total) by mouth daily. 90 tablet 2 Taking  . vitamin B-12 (CYANOCOBALAMIN) 500 MCG tablet Take 500 mcg by mouth daily.   Taking     Allergies  Allergen Reactions  . Phenazopyridine Nausea Only and Other (See Comments)    GI UPSET  . Ramipril Other (See Comments)    unk     Past Medical History:  Diagnosis Date  . AICD (automatic cardioverter/defibrillator) present   . Biventricular ICD (implantable cardioverter-defibrillator) in place 03/24/2005   Implantation of a Medtronic Adapta ADDRO1, serial number [id number]  . CHF (congestive heart failure) ([place])   . CKD (chronic kidney disease), stage III ([place])   . Coronary artery disease    a. s/p CABG 1986. b. Multiple PCIs/caths. c. 09/2013: s/p PTCA and BMS to SVG-OM.  [name]   . Dysrhythmia   . History of abdominal aortic aneurysm   . History of bleeding peptic ulcer 1980  . History of epididymitis 2013  . HTN (hypertension)   . Hydronephrosis with ureteropelvic junction obstruction   . Hydroureter on left 2009  . Hypertension   . Ischemic cardiomyopathy    a. Prior EF 30-35%, s/p BIV-ICD. b. 09/2013:  EF 45-50%.  . Moderate tricuspid regurgitation   . PAF (paroxysmal atrial fibrillation) ([place])   . Presence of permanent cardiac pacemaker   . Prostate cancer ([place])   . Status post coronary artery bypass grafting 1986   LIMA to the LAD, SVG to OM, SVG to RCA  . Testicular swelling     Review of systems:  Otherwise negative.    Physical Exam- with the assistance of a sign language interpreter  Gen: Alert, oriented. Appears stated age.  HEENT: [place]/AT. PERRLA. Lungs: CTA, no wheezes. CV: RR nl S1, S2. Abd: soft, benign, no masses. BS+ Ext: No edema. Pulses  2+    Planned procedures: Proceed with EGD and colonoscopy. The patient understands the nature of the planned procedure, indications, risks, alternatives and potential complications including but not limited to bleeding, infection, perforation, damage to internal organs and possible oversedation/side effects from anesthesia. The patient agrees and gives consent to proceed.  Please refer to procedure notes for findings, recommendations and patient disposition/instructions.     Thomas [name] Thomas [name], M.D. Gastroenterology 07/13/2018  8:40 AM

## 2018-07-13 NOTE — Op Note (Signed)
[facility] Gastroenterology Patient Name: Thomas [name] Procedure Date: 07/13/2018 7:54 AM MRN: [id number] Account #: [id number] Date of Birth: 06-14-1934 Admit Type: Outpatient Age: 84 Room: [facility] ENDO ROOM 4 Gender: Male Note Status: Finalized Procedure:            Upper GI endoscopy Indications:          Iron deficiency anemia secondary to chronic blood loss,                        Heme positive stool, Melena Providers:            Benay Pike. Alice Reichert MD, MD Referring MD:         Irven Easterly. Kary Kos, MD (Referring MD) Medicines:            Propofol per Anesthesia Complications:        No immediate complications. Procedure:            Pre-Anesthesia Assessment:                       - The risks and benefits of the procedure and the                        sedation options and risks were discussed with the                        patient. All questions were answered and informed                        consent was obtained.                       - Patient identification and proposed procedure were                        verified prior to the procedure by the nurse. The                        procedure was verified in the procedure room.                       - ASA Grade Assessment: III - A patient with severe                        systemic disease.                       - After reviewing the risks and benefits, the patient                        was deemed in satisfactory condition to undergo the                        procedure.                       After obtaining informed consent, the endoscope was                        passed under direct vision. Throughout the procedure,  the patient's blood pressure, pulse, and oxygen                        saturations were monitored continuously. The Endoscope                        was introduced through the mouth, and advanced to the                        third part of duodenum. The upper GI endoscopy  was                        accomplished without difficulty. The patient tolerated                        the procedure well. Findings:      The examined esophagus was normal.      Patchy mild inflammation characterized by erythema was found in the       gastric antrum.      Clotted blood was found in the gastric body. small clot was suctioned       out without any overt ulceration, erosion, avm or mass noted in the       stomach. No evidence of gastric varices.      Two small angioectasias without bleeding were found in the third portion       of the duodenum. Coagulation for bleeding prevention using argon plasma       at 0.5 liters/minute and 20 watts was successful.      The exam was otherwise without abnormality. Impression:           - Normal esophagus.                       - Gastritis.                       - Clotted blood in the gastric body.                       - Two non-bleeding angioectasias in the duodenum.                        Treated with argon plasma coagulation (APC).                       - The examination was otherwise normal.                       - No specimens collected. Recommendation:       - Proceed with colonoscopy Procedure Code(s):    --- Professional ---                       [phone], Esophagogastroduodenoscopy, flexible, transoral;                        with control of bleeding, any method Diagnosis Code(s):    --- Professional ---                       K92.1, Melena (includes Hematochezia)  R19.5, Other fecal abnormalities                       D50.0, Iron deficiency anemia secondary to blood loss                        (chronic)                       K31.819, Angiodysplasia of stomach and duodenum without                        bleeding                       K92.2, Gastrointestinal hemorrhage, unspecified                       K29.70, Gastritis, unspecified, without bleeding CPT copyright 2019 American Medical Association. All  rights reserved. The codes documented in this report are preliminary and upon coder review may  be revised to meet current compliance requirements. Efrain Sella MD, MD 07/13/2018 9:08:43 AM This report has been signed electronically. Number of Addenda: 0 Note Initiated On: 07/13/2018 7:54 AM      [facility]

## 2018-07-14 ENCOUNTER — Telehealth: Payer: PPO | Admitting: Internal Medicine

## 2018-07-14 ENCOUNTER — Encounter: Payer: Self-pay | Admitting: Internal Medicine

## 2018-07-14 ENCOUNTER — Telehealth: Payer: Self-pay | Admitting: Internal Medicine

## 2018-07-14 LAB — SURGICAL PATHOLOGY

## 2018-07-14 NOTE — Telephone Encounter (Signed)
Returned call to Pt.  Rescheduled Pt for tomorrow at 10:45 am.

## 2018-07-14 NOTE — Telephone Encounter (Signed)
New Message:    Pt is calling about his appt, it was today?

## 2018-07-14 NOTE — Telephone Encounter (Signed)
Returned call to services.  Was advised [facility] has an [employer] vendor for virtual visits.  Number received.  Appears this Pt has his own services connected to his landline.

## 2018-07-14 NOTE — Telephone Encounter (Signed)
New Message    Janett Billow is calling from [place] services for the virtual visit   Please call

## 2018-07-14 NOTE — OR Nursing (Signed)
Following procedure, the magnet was removed from over the pacemaker.   Called to have the pacemaker checked before his discharge.  OK'ed by the company.

## 2018-07-15 ENCOUNTER — Telehealth: Payer: PPO | Admitting: Internal Medicine

## 2018-07-15 ENCOUNTER — Other Ambulatory Visit: Payer: Self-pay

## 2018-07-18 ENCOUNTER — Telehealth: Payer: Self-pay | Admitting: Cardiology

## 2018-07-18 NOTE — Telephone Encounter (Signed)
Call placed to Dr. Ricky Stabs office.  Currently closed.  Will call back later.

## 2018-07-18 NOTE — Telephone Encounter (Signed)
See new phone note.  Confirmation of receiving the surgical clearance.

## 2018-07-18 NOTE — Telephone Encounter (Signed)
Call placed to Dr. Ricky Stabs office in re: to Surgical Clearance Request. Spoke with Anderson Malta and the nurse was on the phone so she will have her call back with an answer whether they have or have not received clearance back.

## 2018-07-18 NOTE — Telephone Encounter (Signed)
Follow Up:   Shirlean Mylar called and said to let you know she did receive pt's clearance.

## 2018-07-19 ENCOUNTER — Telehealth: Payer: PPO | Admitting: Internal Medicine

## 2018-07-19 ENCOUNTER — Other Ambulatory Visit: Payer: Self-pay

## 2018-07-20 ENCOUNTER — Ambulatory Visit (INDEPENDENT_AMBULATORY_CARE_PROVIDER_SITE_OTHER): Payer: PPO | Admitting: Internal Medicine

## 2018-07-20 ENCOUNTER — Encounter: Payer: Self-pay | Admitting: Internal Medicine

## 2018-07-20 ENCOUNTER — Other Ambulatory Visit: Payer: Self-pay

## 2018-07-20 VITALS — BP 132/76 | HR 87 | Ht 74.0 in | Wt 236.0 lb

## 2018-07-20 DIAGNOSIS — Z950 Presence of cardiac pacemaker: Secondary | ICD-10-CM

## 2018-07-20 DIAGNOSIS — I5022 Chronic systolic (congestive) heart failure: Secondary | ICD-10-CM

## 2018-07-20 DIAGNOSIS — I10 Essential (primary) hypertension: Secondary | ICD-10-CM

## 2018-07-20 DIAGNOSIS — Z95810 Presence of automatic (implantable) cardiac defibrillator: Secondary | ICD-10-CM | POA: Diagnosis not present

## 2018-07-20 DIAGNOSIS — I255 Ischemic cardiomyopathy: Secondary | ICD-10-CM

## 2018-07-20 LAB — CUP PACEART INCLINIC DEVICE CHECK
Battery Remaining Longevity: 13 mo
Battery Voltage: 2.88 V
Brady Statistic AP VP Percent: 0 %
Brady Statistic AP VS Percent: 0 %
Brady Statistic AS VP Percent: 98.17 %
Brady Statistic AS VS Percent: 1.83 %
Brady Statistic RA Percent Paced: 0 %
Brady Statistic RV Percent Paced: 97.67 %
Date Time Interrogation Session: 20200506175002
HighPow Impedance: 54 Ohm
Implantable Lead Implant Date: 20000210
Implantable Lead Implant Date: 20000210
Implantable Lead Implant Date: 20141003
Implantable Lead Location: 753858
Implantable Lead Location: 753859
Implantable Lead Location: 753860
Implantable Lead Model: 4244
Implantable Lead Model: 4285
Implantable Lead Model: 4298
Implantable Lead Serial Number: 272469
Implantable Lead Serial Number: 413633
Implantable Pulse Generator Implant Date: 20141003
Lead Channel Impedance Value: 266 Ohm
Lead Channel Impedance Value: 323 Ohm
Lead Channel Impedance Value: 342 Ohm
Lead Channel Impedance Value: 342 Ohm
Lead Channel Impedance Value: 380 Ohm
Lead Channel Impedance Value: 380 Ohm
Lead Channel Impedance Value: 399 Ohm
Lead Channel Impedance Value: 437 Ohm
Lead Channel Impedance Value: 551 Ohm
Lead Channel Impedance Value: 608 Ohm
Lead Channel Impedance Value: 608 Ohm
Lead Channel Impedance Value: 646 Ohm
Lead Channel Impedance Value: 646 Ohm
Lead Channel Pacing Threshold Amplitude: 0.75 V
Lead Channel Pacing Threshold Amplitude: 1.5 V
Lead Channel Pacing Threshold Pulse Width: 0.4 ms
Lead Channel Pacing Threshold Pulse Width: 0.6 ms
Lead Channel Sensing Intrinsic Amplitude: 0.875 mV
Lead Channel Sensing Intrinsic Amplitude: 0.875 mV
Lead Channel Sensing Intrinsic Amplitude: 4.25 mV
Lead Channel Sensing Intrinsic Amplitude: 5.125 mV
Lead Channel Setting Pacing Amplitude: 2 V
Lead Channel Setting Pacing Amplitude: 2.5 V
Lead Channel Setting Pacing Pulse Width: 0.4 ms
Lead Channel Setting Pacing Pulse Width: 0.6 ms
Lead Channel Setting Sensing Sensitivity: 0.3 mV

## 2018-07-20 MED ORDER — FUROSEMIDE 40 MG PO TABS: 40.0000 mg | ORAL_TABLET | Freq: Every day | ORAL | 3 refills | Status: DC

## 2018-07-20 MED ORDER — LOSARTAN POTASSIUM 50 MG PO TABS: 50.0000 mg | ORAL_TABLET | Freq: Every day | ORAL | 3 refills | Status: DC

## 2018-07-20 NOTE — Progress Notes (Signed)
HPI Thomas [name] returns today for ongoing evaluation and management of his ICD. 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 He has not had an ICD shock. He denies anginal symptoms. He has not been taking lasix although it was on his med list in January but not in March. He has been bothered by swelling and sob. He denies syncope. He recently had a colonoscopy and an EGD. Allergies  Allergen Reactions  . Phenazopyridine Nausea Only and Other (See Comments)    GI UPSET  . Ramipril Other (See Comments)    unk     Current Outpatient Medications  Medication Sig Dispense Refill  . albuterol (PROVENTIL HFA;VENTOLIN HFA) 108 (90 Base) MCG/ACT inhaler Inhale 2 puffs into the lungs 4 (four) times daily as needed.     [name] apixaban (ELIQUIS) 5 MG TABS tablet Take 1 tablet (5 mg total) by mouth 2 (two) times daily. Please repeat blood work prior to next refill authorization. 180 tablet 0  . carvedilol (COREG) 12.5 MG tablet Take 12.5 mg by mouth daily.    . ferrous sulfate 325 (65 FE) MG tablet Take 1 tablet (325 mg total) by mouth daily for 30 days. 30 tablet 0  . HYDROCORTISONE, TOPICAL, 2 % LOTN Apply 40 oz topically 2 (two) times daily. APPLY BID TO AFFECTED AREA 1 Bottle 0  . isosorbide mononitrate (IMDUR) 60 MG 24 hr tablet Take 1.5 tablets by mouth daily (Patient taking differently: Take 90 mg by mouth daily. Take 1.5 tablets by mouth daily) 135 tablet 0  . pantoprazole (PROTONIX) 40 MG tablet Take 1 tablet (40 mg total) by mouth daily for 30 days. 30 tablet 0  . simvastatin (ZOCOR) 20 MG tablet Take 1 tablet (20 mg total) by mouth daily. 90 tablet 2  . vitamin B-12 (CYANOCOBALAMIN) 500 MCG tablet Take 500 mcg by mouth daily.    . furosemide (LASIX) 40 MG tablet Take 1 tablet (40 mg total) by mouth daily. 90 tablet 3  . losartan (COZAAR) 50 MG tablet Take 1 tablet (50 mg total) by mouth daily. 90 tablet 3   No current facility-administered medications for  this visit.      Past Medical History:  Diagnosis Date  . AICD (automatic cardioverter/defibrillator) present   . Biventricular ICD (implantable cardioverter-defibrillator) in place 03/24/2005   Implantation of a Medtronic Adapta ADDRO1, serial number [id number]  . CHF (congestive heart failure) (Thomas [name])   . CKD (chronic kidney disease), stage III (Thomas [name])   . Coronary artery disease    a. s/p CABG 1986. b. Multiple PCIs/caths. c. 09/2013: s/p PTCA and BMS to SVG-OM.  [name]   . Dysrhythmia   . History of abdominal aortic aneurysm   . History of bleeding peptic ulcer 1980  . History of epididymitis 2013  . HTN (hypertension)   . Hydronephrosis with ureteropelvic junction obstruction   . Hydroureter on left 2009  . Hypertension   . Ischemic cardiomyopathy    a. Prior EF 30-35%, s/p BIV-ICD. b. 09/2013: EF 45-50%.  . Moderate tricuspid regurgitation   . PAF (paroxysmal atrial fibrillation) (Thomas [name])   . Presence of permanent cardiac pacemaker   . Prostate cancer (Thomas [name])   . Status post coronary artery bypass grafting 1986   LIMA to the LAD, SVG to OM, SVG to RCA  . Testicular swelling     ROS:   All systems reviewed and negative except as noted in the  HPI.   Past Surgical History:  Procedure Laterality Date  . 2-D echocardiogram  11/20/2011   Ejection fraction 30-35% moderate concentric left ventricular hypertrophy. Left atrium is moderately dilated. Mild MR. Mild or  . BI-VENTRICULAR IMPLANTABLE CARDIOVERTER DEFIBRILLATOR N/A 12/16/2012   Procedure: BI-VENTRICULAR IMPLANTABLE CARDIOVERTER DEFIBRILLATOR  (CRT-D);  Surgeon: [name], MD;  Location: [facility] CATH LAB;  Service: Cardiovascular;  Laterality: N/A;  . CARDIAC CATHETERIZATION  12/10/2011   SVG to OM widely patent.  LIMA to LAD patent  . CATARACT EXTRACTION W/PHACO Right 10/12/2017   Procedure: CATARACT EXTRACTION PHACO AND INTRAOCULAR LENS PLACEMENT (IOC);  Surgeon: Birder Robson, MD;  Location: [facility] ORS;  Service:  Ophthalmology;  Laterality: Right;  [place] 00:57 AP% 15.9 CDE 9.07 Fluid pack lot # [id number]  . COLONOSCOPY N/A 07/13/2018   Procedure: COLONOSCOPY;  Surgeon: Toledo, Thomas Pike, MD;  Location: [facility] ENDOSCOPY;  Service: Gastroenterology;  Laterality: N/A;  . CORONARY ARTERY BYPASS GRAFT  1986  . ESOPHAGOGASTRODUODENOSCOPY N/A 07/13/2018   Procedure: ESOPHAGOGASTRODUODENOSCOPY (EGD);  Surgeon: Toledo, Thomas Pike, MD;  Location: [facility] ENDOSCOPY;  Service: Gastroenterology;  Laterality: N/A;  . ESOPHAGOGASTRODUODENOSCOPY (EGD) WITH PROPOFOL N/A 05/27/2018   Procedure: ESOPHAGOGASTRODUODENOSCOPY (EGD) WITH PROPOFOL;  Surgeon: Thomas Essex, MD;  Location: [facility];  Service: Endoscopy;  Laterality: N/A;  . INSERT / REPLACE / REMOVE PACEMAKER    . LEFT HEART CATHETERIZATION WITH CORONARY/GRAFT ANGIOGRAM N/A 12/10/2011   Procedure: LEFT HEART CATHETERIZATION WITH Thomas [name];  Surgeon: Sanda Klein, MD;  Location: [facility] CATH LAB;  Service: Cardiovascular;  Laterality: N/A;  . LEFT HEART CATHETERIZATION WITH CORONARY/GRAFT ANGIOGRAM N/A 09/25/2013   Procedure: LEFT HEART CATHETERIZATION WITH Thomas [name];  Surgeon: Thomas Ohara, MD;  Location: [facility] CATH LAB;  Service: Cardiovascular;  Laterality: N/A;  . Persantine Myoview  05/06/2010   Post-rest ejection fraction 30%. No significant ischemia demonstrated. Compared to previous study there is no significant change.  . TRANSURETHRAL RESECTION OF PROSTATE     s/p     Family History  Problem Relation Age of Onset  . Hypertension Father      Social History   Socioeconomic History  . Marital status: Married    Spouse name: Not on file  . Number of children: Not on file  . Years of education: Not on file  . Highest education level: Not on file  Occupational History  . Not on file  Social Needs  . Financial resource strain: Not on file  . Food insecurity:    Worry: Not on file    Inability: Not on file  . Transportation  needs:    Medical: Not on file    Non-medical: Not on file  Tobacco Use  . Smoking status: Former Smoker    Last attempt to quit: 03/15/1985    Years since quitting: 33.3  . Smokeless tobacco: Never Used  Substance and Sexual Activity  . Alcohol use: No    Comment: occas.  . Drug use: No  . Sexual activity: Yes  Lifestyle  . Physical activity:    Days per week: Not on file    Minutes per session: Not on file  . Stress: Not on file  Relationships  . Social connections:    Talks on phone: Not on file    Gets together: Not on file    Attends religious service: Not on file    Active member of club or organization: Not on file    Attends meetings of clubs or organizations: Not on file  Relationship status: Not on file  . Intimate partner violence:    Fear of current or ex partner: Not on file    Emotionally abused: Not on file    Physically abused: Not on file    Forced sexual activity: Not on file  Other Topics Concern  . Not on file  Social History Narrative  . Not on file     BP 132/76   Pulse 87   Ht 6\' 2"  (1.88 m)   Wt 236 lb (107 kg)   BMI 30.30 kg/m   Physical Exam:  Well appearing NAD HEENT: Unremarkable Neck:  No JVD, no thyromegally Lymphatics:  No adenopathy Back:  No CVA tenderness Lungs:  Clear with no wheezes HEART:  Regular rate rhythm, no murmurs, no rubs, no clicks Abd:  soft, positive bowel sounds, no organomegally, no rebound, no guarding Ext:  2 plus pulses, no edema, no cyanosis, no clubbing Skin:  No rashes no nodules Neuro:  CN II through XII intact, motor grossly intact  EKG - NSR with biv pacing  DEVICE  Normal device function.  See PaceArt for details.   Assess/Plan: 1. Chronic systolic heart failure - I have asked him to restart lasix 40 mg daily. Stop HCTZ. 2. ICD - his medtronic Biv ICD is working normally. We will recheck in several months. 3. CAD - he denies anginal symptoms. He is still working.  4. Atrial fib - He  remains on systemic anti-coagulation and has not had any bleeding. Since his last ICD check, no atrial fib.  Thomas [name].D.

## 2018-07-20 NOTE — Patient Instructions (Signed)
Medication Instructions:  Your physician has recommended you make the following change in your medication:   1.  STOP taking HCTZ (hydrochlorothiazide) if you are taking this medication  2.  Start taking LOSARTAN 50 mg-  Take one tablet by mouth once a day  3.  Start taking LASIX 40 mg-- Take one tablet by mouth once a day  Labwork: None ordered.  Testing/Procedures: None ordered.  Follow-Up:  You will follow up with Dr. Sallyanne Kuster in 2 months.  Your physician wants you to follow-up in: one year with Dr. [name].   You will receive a reminder letter in the mail two months in advance. If you don't receive a letter, please call our office to schedule the follow-up appointment.  Remote monitoring is used to monitor your ICD from home. This monitoring reduces the number of office visits required to check your device to one time per year. It allows [place] to keep an eye on the functioning of your device to ensure it is working properly. You are scheduled for a device check from home on 07/26/2018. You may send your transmission at any time that day. If you have a wireless device, the transmission will be sent automatically. After your physician reviews your transmission, you will receive a postcard with your next transmission date.  Any Other Special Instructions Will Be Listed Below (If Applicable).  If you need a refill on your cardiac medications before your next appointment, please call your pharmacy.

## 2018-07-25 ENCOUNTER — Telehealth: Payer: Self-pay | Admitting: Internal Medicine

## 2018-07-25 NOTE — Telephone Encounter (Signed)
New Message    Pt is calling because he will be at work the day he is scheduled for his home remote check [place] can only do afternoon appts on those days   Please call to reschedule home remote check

## 2018-07-25 NOTE — Telephone Encounter (Signed)
I changed the appointment time to 6 pm

## 2018-07-26 ENCOUNTER — Other Ambulatory Visit: Payer: Self-pay

## 2018-07-26 ENCOUNTER — Telehealth: Payer: Self-pay | Admitting: Cardiology

## 2018-07-26 ENCOUNTER — Encounter: Payer: PPO | Admitting: *Deleted

## 2018-07-26 NOTE — Telephone Encounter (Signed)
Spoke with patient and wife through [place] interpreter. Pt has been feeling weak, no appetite for the past few days. Denies abdominal distention, LE edema improving, doesn't feel like he is retaining fluid. He recently had an EGD and colonoscopy--diagnosed with a "bleeding ulcer", f/u at [facility] planned for next month.  Pt stopped HCTZ and started furosemide and losartan as instructed at OV on 5/6. BP 146/83 right now, weight 232lb per home scale, though pt doesn't weigh daily. Just took today's dose of furosemide. ShOB about the same, but ran out of his inhaler and picking up a refill soon.  Advised transmission shows normal device function, but OptiVol suggests ongoing fluid retention since end of April. Discussed fluid and sodium restriction. Advised I'll route this message to Dr. [name] and Sonia Baller, RN regarding heart failure diagnostics. Advised to contact GI doctor regarding lack of appetite. Pt and wife agreeable and aware to call back for new or worsening symptoms.

## 2018-07-26 NOTE — Telephone Encounter (Signed)
Transmission received.

## 2018-07-26 NOTE — Telephone Encounter (Signed)
Patient wife called using a sign language interpreter. She stated that pt has not been feeling well and hasn't been eating. Patient was due for a transmission today. Transmission was not received. Instructed pt wife to have pt send a manual transmission w/ his monitor. Once transmission is received a RN will review and call back. Pt wife verbalized understanding.

## 2018-07-28 NOTE — Telephone Encounter (Signed)
Pt does not feel like he is retaining fluid.  Med changes made on 07/20/2018.  Pt was enrolled in [facility] clinic.  Outreach made to [facility] nurse.  Asked to [place] this nurse after next remote to see if there has been any improvement after med changes.  If no improvement at that time will see if Dr. [name] wants to increase any medications.

## 2018-07-29 ENCOUNTER — Telehealth: Payer: Self-pay | Admitting: Cardiovascular Disease

## 2018-07-29 NOTE — Telephone Encounter (Signed)
°*  STAT* If patient is at the pharmacy, call can be transferred to refill team.   1. Which medications need to be refilled? (please list name of each medication and dose if known)   albuterol (PROVENTIL HFA;VENTOLIN HFA) 108 (90 Base) MCG/ACT inhaler  2. Which pharmacy/location (including street and city if local pharmacy) is medication to be sent to? [id number] [address], [place]   3. Do they need a 30 day or 90 day supply? 30  Pt called to ask for a refill of this medication. This is not something Dr. Sallyanne Kuster usually writes. I tried to reach out to his PCP, but the office was closed

## 2018-07-29 NOTE — Telephone Encounter (Signed)
Follow up    Patient is calling about her refill

## 2018-07-29 NOTE — Telephone Encounter (Signed)
Message forwarded to Dr Sallyanne Kuster for review.

## 2018-07-30 NOTE — Telephone Encounter (Signed)
Please refill his inhaler

## 2018-08-01 DIAGNOSIS — J209 Acute bronchitis, unspecified: Secondary | ICD-10-CM | POA: Diagnosis not present

## 2018-08-01 DIAGNOSIS — Z8719 Personal history of other diseases of the digestive system: Secondary | ICD-10-CM | POA: Diagnosis not present

## 2018-08-01 DIAGNOSIS — I255 Ischemic cardiomyopathy: Secondary | ICD-10-CM | POA: Diagnosis not present

## 2018-08-01 DIAGNOSIS — I5022 Chronic systolic (congestive) heart failure: Secondary | ICD-10-CM | POA: Diagnosis not present

## 2018-08-01 DIAGNOSIS — R05 Cough: Secondary | ICD-10-CM | POA: Diagnosis not present

## 2018-08-01 DIAGNOSIS — D509 Iron deficiency anemia, unspecified: Secondary | ICD-10-CM | POA: Diagnosis not present

## 2018-08-01 DIAGNOSIS — I480 Paroxysmal atrial fibrillation: Secondary | ICD-10-CM | POA: Diagnosis not present

## 2018-08-01 DIAGNOSIS — Z95810 Presence of automatic (implantable) cardiac defibrillator: Secondary | ICD-10-CM | POA: Diagnosis not present

## 2018-08-02 MED ORDER — ALBUTEROL SULFATE HFA 108 (90 BASE) MCG/ACT IN AERS: 2.0000 | INHALATION_SPRAY | Freq: Four times a day (QID) | RESPIRATORY_TRACT | 1 refills | Status: DC | PRN

## 2018-08-02 NOTE — Telephone Encounter (Signed)
Albuterol inhaler refilled per Dr Sallyanne Kuster.

## 2018-08-11 ENCOUNTER — Other Ambulatory Visit: Payer: Self-pay

## 2018-08-11 ENCOUNTER — Inpatient Hospital Stay (HOSPITAL_COMMUNITY)
Admission: EM | Admit: 2018-08-11 | Discharge: 2018-08-17 | DRG: 291 | Disposition: A | Discharge status: Home Health | Payer: PPO | Attending: Internal Medicine | Admitting: Internal Medicine

## 2018-08-11 ENCOUNTER — Encounter (HOSPITAL_COMMUNITY): Payer: Self-pay

## 2018-08-11 ENCOUNTER — Telehealth: Payer: Self-pay | Admitting: Cardiovascular Disease

## 2018-08-11 ENCOUNTER — Ambulatory Visit: Payer: PPO | Admitting: Cardiology

## 2018-08-11 DIAGNOSIS — Z20828 Contact with and (suspected) exposure to other viral communicable diseases: Secondary | ICD-10-CM | POA: Diagnosis present

## 2018-08-11 DIAGNOSIS — R609 Edema, unspecified: Secondary | ICD-10-CM | POA: Diagnosis not present

## 2018-08-11 DIAGNOSIS — R0600 Dyspnea, unspecified: Secondary | ICD-10-CM | POA: Diagnosis not present

## 2018-08-11 DIAGNOSIS — J90 Pleural effusion, not elsewhere classified: Secondary | ICD-10-CM

## 2018-08-11 DIAGNOSIS — Y929 Unspecified place or not applicable: Secondary | ICD-10-CM

## 2018-08-11 DIAGNOSIS — I4821 Permanent atrial fibrillation: Secondary | ICD-10-CM | POA: Diagnosis present

## 2018-08-11 DIAGNOSIS — E861 Hypovolemia: Secondary | ICD-10-CM | POA: Diagnosis not present

## 2018-08-11 DIAGNOSIS — Z8249 Family history of ischemic heart disease and other diseases of the circulatory system: Secondary | ICD-10-CM

## 2018-08-11 DIAGNOSIS — I5043 Acute on chronic combined systolic (congestive) and diastolic (congestive) heart failure: Secondary | ICD-10-CM | POA: Diagnosis present

## 2018-08-11 DIAGNOSIS — D509 Iron deficiency anemia, unspecified: Secondary | ICD-10-CM | POA: Diagnosis present

## 2018-08-11 DIAGNOSIS — I130 Hypertensive heart and chronic kidney disease with heart failure and stage 1 through stage 4 chronic kidney disease, or unspecified chronic kidney disease: Principal | ICD-10-CM | POA: Diagnosis present

## 2018-08-11 DIAGNOSIS — R0602 Shortness of breath: Secondary | ICD-10-CM | POA: Diagnosis not present

## 2018-08-11 DIAGNOSIS — D649 Anemia, unspecified: Secondary | ICD-10-CM | POA: Diagnosis present

## 2018-08-11 DIAGNOSIS — R0609 Other forms of dyspnea: Secondary | ICD-10-CM | POA: Diagnosis not present

## 2018-08-11 DIAGNOSIS — T501X5A Adverse effect of loop [high-ceiling] diuretics, initial encounter: Secondary | ICD-10-CM | POA: Diagnosis not present

## 2018-08-11 DIAGNOSIS — S40022A Contusion of left upper arm, initial encounter: Secondary | ICD-10-CM | POA: Diagnosis present

## 2018-08-11 DIAGNOSIS — Z8546 Personal history of malignant neoplasm of prostate: Secondary | ICD-10-CM

## 2018-08-11 DIAGNOSIS — D500 Iron deficiency anemia secondary to blood loss (chronic): Secondary | ICD-10-CM | POA: Diagnosis not present

## 2018-08-11 DIAGNOSIS — Z87891 Personal history of nicotine dependence: Secondary | ICD-10-CM

## 2018-08-11 DIAGNOSIS — H9193 Unspecified hearing loss, bilateral: Secondary | ICD-10-CM | POA: Diagnosis present

## 2018-08-11 DIAGNOSIS — Z9861 Coronary angioplasty status: Secondary | ICD-10-CM

## 2018-08-11 DIAGNOSIS — I878 Other specified disorders of veins: Secondary | ICD-10-CM | POA: Diagnosis present

## 2018-08-11 DIAGNOSIS — M79622 Pain in left upper arm: Secondary | ICD-10-CM | POA: Diagnosis not present

## 2018-08-11 DIAGNOSIS — I2510 Atherosclerotic heart disease of native coronary artery without angina pectoris: Secondary | ICD-10-CM | POA: Diagnosis present

## 2018-08-11 DIAGNOSIS — I5022 Chronic systolic (congestive) heart failure: Secondary | ICD-10-CM | POA: Diagnosis present

## 2018-08-11 DIAGNOSIS — I959 Hypotension, unspecified: Secondary | ICD-10-CM | POA: Diagnosis not present

## 2018-08-11 DIAGNOSIS — S4992XA Unspecified injury of left shoulder and upper arm, initial encounter: Secondary | ICD-10-CM | POA: Diagnosis not present

## 2018-08-11 DIAGNOSIS — W010XXA Fall on same level from slipping, tripping and stumbling without subsequent striking against object, initial encounter: Secondary | ICD-10-CM | POA: Diagnosis present

## 2018-08-11 DIAGNOSIS — R600 Localized edema: Secondary | ICD-10-CM | POA: Diagnosis not present

## 2018-08-11 DIAGNOSIS — I5023 Acute on chronic systolic (congestive) heart failure: Secondary | ICD-10-CM | POA: Diagnosis present

## 2018-08-11 DIAGNOSIS — R531 Weakness: Secondary | ICD-10-CM | POA: Diagnosis not present

## 2018-08-11 DIAGNOSIS — Z8679 Personal history of other diseases of the circulatory system: Secondary | ICD-10-CM

## 2018-08-11 DIAGNOSIS — I9589 Other hypotension: Secondary | ICD-10-CM | POA: Diagnosis not present

## 2018-08-11 DIAGNOSIS — R0789 Other chest pain: Secondary | ICD-10-CM | POA: Diagnosis present

## 2018-08-11 DIAGNOSIS — Z79899 Other long term (current) drug therapy: Secondary | ICD-10-CM

## 2018-08-11 DIAGNOSIS — I255 Ischemic cardiomyopathy: Secondary | ICD-10-CM | POA: Diagnosis present

## 2018-08-11 DIAGNOSIS — W19XXXA Unspecified fall, initial encounter: Secondary | ICD-10-CM

## 2018-08-11 DIAGNOSIS — I10 Essential (primary) hypertension: Secondary | ICD-10-CM | POA: Diagnosis not present

## 2018-08-11 DIAGNOSIS — Z951 Presence of aortocoronary bypass graft: Secondary | ICD-10-CM

## 2018-08-11 DIAGNOSIS — E785 Hyperlipidemia, unspecified: Secondary | ICD-10-CM | POA: Diagnosis present

## 2018-08-11 DIAGNOSIS — S299XXA Unspecified injury of thorax, initial encounter: Secondary | ICD-10-CM | POA: Diagnosis not present

## 2018-08-11 DIAGNOSIS — N183 Chronic kidney disease, stage 3 (moderate): Secondary | ICD-10-CM | POA: Diagnosis present

## 2018-08-11 DIAGNOSIS — R109 Unspecified abdominal pain: Secondary | ICD-10-CM | POA: Diagnosis not present

## 2018-08-11 DIAGNOSIS — S0990XA Unspecified injury of head, initial encounter: Secondary | ICD-10-CM | POA: Diagnosis not present

## 2018-08-11 DIAGNOSIS — I509 Heart failure, unspecified: Secondary | ICD-10-CM

## 2018-08-11 DIAGNOSIS — J9811 Atelectasis: Secondary | ICD-10-CM | POA: Diagnosis present

## 2018-08-11 DIAGNOSIS — R296 Repeated falls: Secondary | ICD-10-CM | POA: Diagnosis present

## 2018-08-11 DIAGNOSIS — R710 Precipitous drop in hematocrit: Secondary | ICD-10-CM | POA: Diagnosis not present

## 2018-08-11 DIAGNOSIS — Z95810 Presence of automatic (implantable) cardiac defibrillator: Secondary | ICD-10-CM | POA: Diagnosis present

## 2018-08-11 DIAGNOSIS — Z7901 Long term (current) use of anticoagulants: Secondary | ICD-10-CM

## 2018-08-11 LAB — URINALYSIS, ROUTINE W REFLEX MICROSCOPIC
Bilirubin Urine: NEGATIVE
Glucose, UA: NEGATIVE mg/dL
Hgb urine dipstick: NEGATIVE
Ketones, ur: NEGATIVE mg/dL
Leukocytes,Ua: NEGATIVE
Nitrite: NEGATIVE
Protein, ur: NEGATIVE mg/dL
Specific Gravity, Urine: 1.021 (ref 1.005–1.030)
pH: 5 (ref 5.0–8.0)

## 2018-08-11 LAB — BASIC METABOLIC PANEL
Anion gap: 5 (ref 5–15)
BUN: 29 mg/dL — ABNORMAL HIGH (ref 8–23)
CO2: 26 mmol/L (ref 22–32)
Calcium: 8.4 mg/dL — ABNORMAL LOW (ref 8.9–10.3)
Chloride: 109 mmol/L (ref 98–111)
Creatinine, Ser: 1.26 mg/dL — ABNORMAL HIGH (ref 0.61–1.24)
GFR calc Af Amer: >60 mL/min (ref >60)
GFR calc non Af Amer: 53 mL/min — ABNORMAL LOW (ref >60)
Glucose, Bld: 121 mg/dL — ABNORMAL HIGH (ref 70–99)
Potassium: 4.5 mmol/L (ref 3.5–5.1)
Sodium: 140 mmol/L (ref 135–145)

## 2018-08-11 LAB — CBC
HCT: 27.8 % — ABNORMAL LOW (ref 39.0–52.0)
Hemoglobin: 8.2 g/dL — ABNORMAL LOW (ref 13.0–17.0)
MCH: 23 pg — ABNORMAL LOW (ref 26.0–34.0)
MCHC: 29.5 g/dL — ABNORMAL LOW (ref 30.0–36.0)
MCV: 77.9 fL — ABNORMAL LOW (ref 80.0–100.0)
Platelets: 301 10*3/uL (ref 150–400)
RBC: 3.57 MIL/uL — ABNORMAL LOW (ref 4.22–5.81)
RDW: 19.4 % — ABNORMAL HIGH (ref 11.5–15.5)
WBC: 7.9 10*3/uL (ref 4.0–10.5)
nRBC: 0.3 % — ABNORMAL HIGH (ref 0.0–0.2)

## 2018-08-11 LAB — BRAIN NATRIURETIC PEPTIDE: B Natriuretic Peptide: 316.9 pg/mL — ABNORMAL HIGH (ref 0.0–100.0)

## 2018-08-11 LAB — TROPONIN I: Troponin I: 0.03 ng/mL (ref <0.03)

## 2018-08-11 MED ORDER — SODIUM CHLORIDE 0.9 % IV BOLUS
500.0000 mL | Freq: Once | INTRAVENOUS | Status: AC
  Administered 2018-08-11: 500 mL via INTRAVENOUS

## 2018-08-11 MED ORDER — SODIUM CHLORIDE 0.9% FLUSH
3.0000 mL | Freq: Once | INTRAVENOUS | Status: AC
  Administered 2018-08-11: 3 mL via INTRAVENOUS

## 2018-08-11 MED ORDER — FENTANYL CITRATE (PF) 100 MCG/2ML IJ SOLN
50.0000 ug | Freq: Once | INTRAMUSCULAR | Status: AC
  Administered 2018-08-11: 50 ug via INTRAVENOUS
  Filled 2018-08-11: qty 2

## 2018-08-11 NOTE — Telephone Encounter (Signed)
Wife called on behalf of patient. She states the patient has been feeling weak. He is struggling a little bit to breathe, but does not feel dizzy. [name] He fell once on Sunday, and fell again this morning. She wanted to know if his heart monitor showed any abnormalities.   Please use home line to call the patient. The call will go through an interpreter.

## 2018-08-11 NOTE — Telephone Encounter (Signed)
Returned call to patient's wife spoke to her through a interpreter.Husband woke up this morning very weak.Stated he was so weak he fell.Stated he has new swelling in both legs.No sob. No chest pain.Stated she would like him to be seen in office.Appointment scheduled with Kerin Ransom PA today at 11:00 am.

## 2018-08-11 NOTE — ED Provider Notes (Addendum)
[name]: [id number] Arrival date & time: 08/11/18  1911    History   Chief Complaint Chief Complaint  Patient presents with  . Weakness  . Fall    HPI Thomas [name] is a 84 y.o. male.     Patient with a history of ischemic cardiomyopathy s/p ICD, EF 45-50% 2015), CAD with CABG 2015, CKD, HTN, PAF on Eliquis, past bleeding peptic ulcer, deafness, presents with generalized weakness causing 2 falls today. No dizziness or chest pain prior to falls. He was unable to get himself up from the floor requiring his sons to get him up. He denies fever, headache, nausea, vomiting, cough or congestion. She reports one month of progressive generalized weakness, increased swelling to bilateral LE's and SOB. He was last seen by his doctor in March when they changed his diuretic medications per wife. She reports he has not been eating or drinking well over the last several days. No urinary symptoms. He denies being around any known to have COVID-19 or COVID type symptoms. He complains of pain in his left upper arm and left ribs where he fell this morning. No pelvic or hip pain.   The history is provided by the patient. A language interpreter was used (ASL interpreter via video).  Weakness  Associated symptoms: shortness of breath   Associated symptoms: no abdominal pain, no chest pain, no cough, no diarrhea, no dizziness, no fever, no headaches, no nausea and no vomiting   Fall  Associated symptoms include shortness of breath. Pertinent negatives include no chest pain, no abdominal pain and no headaches.    Past Medical History:  Diagnosis Date  . AICD (automatic cardioverter/defibrillator) present   . Biventricular ICD (implantable cardioverter-defibrillator) in place 03/24/2005   Implantation of a Medtronic Adapta ADDRO1, serial number [id number]  . CHF (congestive heart failure) ([place])   . CKD (chronic kidney disease), stage III  ([place])   . Coronary artery disease    a. s/p CABG 1986. b. Multiple PCIs/caths. c. 09/2013: s/p PTCA and BMS to SVG-OM.  [name]   . Dysrhythmia   . History of abdominal aortic aneurysm   . History of bleeding peptic ulcer 1980  . History of epididymitis 2013  . HTN (hypertension)   . Hydronephrosis with ureteropelvic junction obstruction   . Hydroureter on left 2009  . Hypertension   . Ischemic cardiomyopathy    a. Prior EF 30-35%, s/p BIV-ICD. b. 09/2013: EF 45-50%.  . Moderate tricuspid regurgitation   . PAF (paroxysmal atrial fibrillation) ([place])   . Presence of permanent cardiac pacemaker   . Prostate cancer ([place])   . Status post coronary artery bypass grafting 1986   LIMA to the LAD, SVG to OM, SVG to RCA  . Testicular swelling     Patient Active Problem List   Diagnosis Date Noted  . Acute on chronic combined systolic and diastolic CHF (congestive heart failure) ([place])   . GI bleed 05/26/2018  . Occult GI bleeding 05/25/2018  . Normocytic anemia 05/25/2018  . Elevated troponin 05/25/2018  . Anemia   . Lymphedema 02/28/2018  . Weakness 07/15/2016  . Fatigue 07/15/2016  . CVA (cerebral infarction) 10/31/2015  . Bulbous urethral stricture 09/18/2015  . Bilateral deafness 08/19/2015  . Bilateral cataracts 08/19/2015  . Acid reflux 08/19/2015  . HLD (hyperlipidemia) 08/19/2015  . BP (high blood pressure) 08/19/2015  . Myocardial infarction ([place]) 08/19/2015  . Calculus of kidney 08/19/2015  .  Artificial cardiac pacemaker 08/19/2015  . Gastroduodenal ulcer 08/19/2015  . Dupuytren's contracture of foot 08/19/2015  . Malignant neoplasm of prostate ([place]) 08/19/2015  . Microhematuria 08/19/2015  . Coronary artery disease 02/22/2015  . Status post coronary artery bypass grafting 02/22/2015  . Benign essential HTN 04/02/2014  . Hematochezia 02/20/2014  . Moderate tricuspid regurgitation 02/20/2014  . Mobitz type II atrioventricular block 12/12/2013  . Long term current use  of anticoagulant 10/18/2013  . Cardiomyopathy, ischemic: Ejection fraction 45% 11/11/2012  . CAD Status post coronary artery bypass grafting: 1995 11/11/2012  . Permanent atrial fibrillation ([place]) 11/11/2012  . Biventricular ICD Medtronic Viva single chamber October 2014 11/11/2012  . Chronic systolic heart failure ([place]) 11/11/2012    Past Surgical History:  Procedure Laterality Date  . 2-D echocardiogram  11/20/2011   Ejection fraction 30-35% moderate concentric left ventricular hypertrophy. Left atrium is moderately dilated. Mild MR. Mild or  . BI-VENTRICULAR IMPLANTABLE CARDIOVERTER DEFIBRILLATOR N/A 12/16/2012   Procedure: BI-VENTRICULAR IMPLANTABLE CARDIOVERTER DEFIBRILLATOR  (CRT-D);  Surgeon: [name], MD;  Location: [facility] CATH LAB;  Service: Cardiovascular;  Laterality: N/A;  . CARDIAC CATHETERIZATION  12/10/2011   SVG to OM widely patent.  LIMA to LAD patent  . CATARACT EXTRACTION W/PHACO Right 10/12/2017   Procedure: CATARACT EXTRACTION PHACO AND INTRAOCULAR LENS PLACEMENT (IOC);  Surgeon: Birder Robson, MD;  Location: [facility] ORS;  Service: Ophthalmology;  Laterality: Right;  [place] 00:57 AP% 15.9 CDE 9.07 Fluid pack lot # [id number]  . COLONOSCOPY N/A 07/13/2018   Procedure: COLONOSCOPY;  Surgeon: Toledo, Benay Pike, MD;  Location: [facility] ENDOSCOPY;  Service: Gastroenterology;  Laterality: N/A;  . CORONARY ARTERY BYPASS GRAFT  1986  . ESOPHAGOGASTRODUODENOSCOPY N/A 07/13/2018   Procedure: ESOPHAGOGASTRODUODENOSCOPY (EGD);  Surgeon: Toledo, Benay Pike, MD;  Location: [facility] ENDOSCOPY;  Service: Gastroenterology;  Laterality: N/A;  . ESOPHAGOGASTRODUODENOSCOPY (EGD) WITH PROPOFOL N/A 05/27/2018   Procedure: ESOPHAGOGASTRODUODENOSCOPY (EGD) WITH PROPOFOL;  Surgeon: Clarene Essex, MD;  Location: [facility];  Service: Endoscopy;  Laterality: N/A;  . INSERT / REPLACE / REMOVE PACEMAKER    . LEFT HEART CATHETERIZATION WITH CORONARY/GRAFT ANGIOGRAM N/A 12/10/2011   Procedure: LEFT HEART  CATHETERIZATION WITH Beatrix Fetters;  Surgeon: Sanda Klein, MD;  Location: [facility] CATH LAB;  Service: Cardiovascular;  Laterality: N/A;  . LEFT HEART CATHETERIZATION WITH CORONARY/GRAFT ANGIOGRAM N/A 09/25/2013   Procedure: LEFT HEART CATHETERIZATION WITH Beatrix Fetters;  Surgeon: Blane Ohara, MD;  Location: [facility] CATH LAB;  Service: Cardiovascular;  Laterality: N/A;  . Persantine Myoview  05/06/2010   Post-rest ejection fraction 30%. No significant ischemia demonstrated. Compared to previous study there is no significant change.  . TRANSURETHRAL RESECTION OF PROSTATE     s/p        Home Medications    Prior to Admission medications   Medication Sig Start Date End Date Taking? Authorizing Provider  albuterol (VENTOLIN HFA) 108 (90 Base) MCG/ACT inhaler Inhale 2 puffs into the lungs 4 (four) times daily as needed. 08/02/18   Croitoru, Mihai, MD  apixaban (ELIQUIS) 5 MG TABS tablet Take 1 tablet (5 mg total) by mouth 2 (two) times daily. Please repeat blood work prior to next refill authorization. 07/06/18   Martinique, Peter M, MD  carvedilol (COREG) 12.5 MG tablet Take 12.5 mg by mouth daily.    [provider]  ferrous sulfate 325 (65 FE) MG tablet Take 1 tablet (325 mg total) by mouth daily for 30 days. 05/28/18   Elodia Florence., MD  furosemide (LASIX) 40  MG tablet Take 1 tablet (40 mg total) by mouth daily. 07/20/18 10/18/18  [name], MD  HYDROCORTISONE, TOPICAL, 2 % LOTN Apply 40 oz topically 2 (two) times daily. APPLY BID TO AFFECTED AREA 04/25/18   Lendon Colonel, NP  isosorbide mononitrate (IMDUR) 60 MG 24 hr tablet Take 1.5 tablets by mouth daily Patient taking differently: Take 90 mg by mouth daily. Take 1.5 tablets by mouth daily 04/27/18   Croitoru, Mihai, MD  losartan (COZAAR) 50 MG tablet Take 1 tablet (50 mg total) by mouth daily. 07/20/18 10/18/18  [name], MD  pantoprazole (PROTONIX) 40 MG tablet Take 1 tablet (40 mg total) by mouth  daily for 30 days. 05/29/18   Elodia Florence., MD  simvastatin (ZOCOR) 20 MG tablet Take 1 tablet (20 mg total) by mouth daily. 06/14/18   Lendon Colonel, NP  vitamin B-12 (CYANOCOBALAMIN) 500 MCG tablet Take 500 mcg by mouth daily.    [provider]    Family History Family History  Problem Relation Age of Onset  . Hypertension Father     Social History Social History   Tobacco Use  . Smoking status: Former Smoker    Last attempt to quit: 03/15/1985    Years since quitting: 33.4  . Smokeless tobacco: Never Used  Substance Use Topics  . Alcohol use: No    Comment: occas.  . Drug use: No     Allergies   Phenazopyridine and Ramipril   Review of Systems Review of Systems  Constitutional: Positive for activity change and appetite change. Negative for chills and fever.  HENT: Negative.  Negative for congestion and sore throat.   Eyes: Negative for visual disturbance.  Respiratory: Positive for shortness of breath. Negative for cough.   Cardiovascular: Positive for leg swelling. Negative for chest pain.  Gastrointestinal: Negative.  Negative for abdominal pain, diarrhea, nausea and vomiting.  Genitourinary: Negative.   Musculoskeletal: Negative for neck pain.       See HPI.  Skin: Positive for color change.  Neurological: Positive for weakness. Negative for dizziness, facial asymmetry, light-headedness and headaches.  Hematological: Bruises/bleeds easily.  Psychiatric/Behavioral: Negative for confusion.     Physical Exam Updated Vital Signs BP 120/70 (BP Location: Right Arm)   Pulse 64   Temp 98.1 F (36.7 C) (Oral)   Resp 16   SpO2 96%   Physical Exam Vitals signs and nursing note reviewed.  Constitutional:      General: He is not in acute distress.    Appearance: He is well-developed.  HENT:     Head: Normocephalic and atraumatic.     Nose: Nose normal.     Mouth/Throat:     Mouth: Mucous membranes are moist.  Eyes:     Extraocular  Movements: Extraocular movements intact.     Conjunctiva/sclera: Conjunctivae normal.     Pupils: Pupils are equal, round, and reactive to light.  Neck:     Musculoskeletal: Normal range of motion and neck supple.  Cardiovascular:     Rate and Rhythm: Normal rate and regular rhythm.     Heart sounds: No murmur.  Pulmonary:     Effort: Pulmonary effort is normal.     Breath sounds: Rales present. No wheezing or rhonchi.     Comments: Pacemaker/defib present in left chest. Chest:     Chest wall: No tenderness.  Abdominal:     General: Bowel sounds are normal.     Palpations: Abdomen is soft.  Tenderness: There is no abdominal tenderness. There is no guarding or rebound.  Musculoskeletal: Normal range of motion.     Right lower leg: Edema present.     Left lower leg: Edema present.     Comments: Left UE is bruised. No bony deformity. ROM is limited secondary to pain. Tender most in proximal UE. Distal pulses present. Significant bilateral LE edema, 3+. No weeping, redness or discoloration.   Skin:    General: Skin is warm and dry.     Findings: No rash.  Neurological:     General: No focal deficit present.     Mental Status: He is alert and oriented to person, place, and time. Mental status is at baseline.     Sensory: No sensory deficit.     Coordination: Coordination normal.      ED Treatments / Results  Labs (all labs ordered are listed, but only abnormal results are displayed) Labs Reviewed  BASIC METABOLIC PANEL - Abnormal; Notable for the following components:      Result Value   Glucose, Bld 121 (*)    BUN 29 (*)    Creatinine, Ser 1.26 (*)    Calcium 8.4 (*)    GFR calc non Af Amer 53 (*)    All other components within normal limits  CBC - Abnormal; Notable for the following components:   RBC 3.57 (*)    Hemoglobin 8.2 (*)    HCT 27.8 (*)    MCV 77.9 (*)    MCH 23.0 (*)    MCHC 29.5 (*)    RDW 19.4 (*)    nRBC 0.3 (*)    All other components within  normal limits  SARS CORONAVIRUS 2 (HOSPITAL ORDER, [facility] LAB)  URINALYSIS, ROUTINE W REFLEX MICROSCOPIC  BRAIN NATRIURETIC PEPTIDE  TROPONIN I  CBG MONITORING, ED    EKG EKG Interpretation  Date/Time:  Thursday Aug 11 2018 19:58:02 EDT Ventricular Rate:  61 PR Interval:    QRS Duration: 154 QT Interval:  470 QTC Calculation: 473 R Axis:   -72 Text Interpretation:  Ventricular-paced rhythm Biventricular pacemaker detected Abnormal ECG similar to previous Confirmed by Theotis Burrow [id number]) on 08/11/2018 9:51:28 PM   Radiology No results found.  Procedures Procedures (including critical care time)  Medications Ordered in ED Medications  sodium chloride flush (NS) 0.9 % injection 3 mL (has no administration in time range)  sodium chloride 0.9 % bolus 500 mL (has no administration in time range)  fentaNYL (SUBLIMAZE) injection 50 mcg (has no administration in time range)     Initial Impression / Assessment and Plan / ED Course  I have reviewed the triage vital signs and the nursing notes.  Pertinent labs & imaging results that were available during my care of the patient were reviewed by me and considered in my medical decision making (see chart for details).  Duriel Deery was evaluated in the Emergency Department on 08/12/18 for the symptoms described in the history of present illness. He was evaluated in the context of the global COVID-19 pandemic, which necessitated consideration that the patient might be at risk for infection with the SARS-CoV-2 virus that causes COVID-19. Institutional protocols and algorithms that pertain to the evaluation of patients at risk for COVID-19 are in a state of rapid change based on information released by regulatory bodies including the CDC and federal and state organizations. These policies and algorithms were followed during the patient's care in the ED.  Patient to ED for evaluation of progressive  weakness and SOB, with increased LE edema. No chest pain or fever. Falls x 2 today, too weak to get up on his own.   The patient is nontoxic in appearance. Awake, alert. He has significant LE edema bilaterally. CXR does not show significant CHF, however, symptoms/presentation are c/w CHF exacerbation. Weakness is felt unexplained, but CT head, blood studies do not support neurologic event or acute infection.   IV Lasix ordered. Patient and wife updated on results. Feel he would benefit from admission to the hospital for diuresis and observation for improvement in weakness. COVID test negative.   Hospitalist was paged for [place].  Final Clinical Impressions(s) / ED Diagnoses   Final diagnoses:  None   1. Peripheral Edema 2. DOE 3. Weakness 4. Fall  ED Discharge Orders    None       Charlann Lange, PA-C 08/12/18 0239    Charlann Lange, PA-C 08/12/18 0732    Ward, Delice Bison, DO 08/12/18 [phone]

## 2018-08-11 NOTE — ED Triage Notes (Addendum)
Using sign language interpreter: Per wife patient has been having increased falls over the last 2 weeks including 2 today. He also reports generalized weakness, SOB and swelling to his lower extremities. Pt appears pale in color and reports he is scheduled to have colonoscopy and endoscopy next month but denies rectal bleeding or dark tarry stool. He is alert and oriented, only endorses pain to the left scapula area. 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

## 2018-08-12 ENCOUNTER — Ambulatory Visit: Payer: PPO | Admitting: Cardiology

## 2018-08-12 ENCOUNTER — Other Ambulatory Visit: Payer: Self-pay

## 2018-08-12 ENCOUNTER — Emergency Department (HOSPITAL_COMMUNITY): Payer: PPO

## 2018-08-12 DIAGNOSIS — Z951 Presence of aortocoronary bypass graft: Secondary | ICD-10-CM | POA: Diagnosis not present

## 2018-08-12 DIAGNOSIS — I10 Essential (primary) hypertension: Secondary | ICD-10-CM | POA: Diagnosis not present

## 2018-08-12 DIAGNOSIS — Z8546 Personal history of malignant neoplasm of prostate: Secondary | ICD-10-CM | POA: Diagnosis not present

## 2018-08-12 DIAGNOSIS — R0789 Other chest pain: Secondary | ICD-10-CM | POA: Diagnosis present

## 2018-08-12 DIAGNOSIS — R296 Repeated falls: Secondary | ICD-10-CM | POA: Diagnosis present

## 2018-08-12 DIAGNOSIS — N183 Chronic kidney disease, stage 3 (moderate): Secondary | ICD-10-CM | POA: Diagnosis present

## 2018-08-12 DIAGNOSIS — D500 Iron deficiency anemia secondary to blood loss (chronic): Secondary | ICD-10-CM | POA: Diagnosis not present

## 2018-08-12 DIAGNOSIS — W010XXA Fall on same level from slipping, tripping and stumbling without subsequent striking against object, initial encounter: Secondary | ICD-10-CM | POA: Diagnosis present

## 2018-08-12 DIAGNOSIS — Z9861 Coronary angioplasty status: Secondary | ICD-10-CM | POA: Diagnosis not present

## 2018-08-12 DIAGNOSIS — I9589 Other hypotension: Secondary | ICD-10-CM | POA: Diagnosis not present

## 2018-08-12 DIAGNOSIS — Z87891 Personal history of nicotine dependence: Secondary | ICD-10-CM | POA: Diagnosis not present

## 2018-08-12 DIAGNOSIS — D509 Iron deficiency anemia, unspecified: Secondary | ICD-10-CM | POA: Diagnosis present

## 2018-08-12 DIAGNOSIS — Z95810 Presence of automatic (implantable) cardiac defibrillator: Secondary | ICD-10-CM | POA: Diagnosis not present

## 2018-08-12 DIAGNOSIS — J9811 Atelectasis: Secondary | ICD-10-CM | POA: Diagnosis present

## 2018-08-12 DIAGNOSIS — R609 Edema, unspecified: Secondary | ICD-10-CM

## 2018-08-12 DIAGNOSIS — Z8679 Personal history of other diseases of the circulatory system: Secondary | ICD-10-CM | POA: Diagnosis not present

## 2018-08-12 DIAGNOSIS — Z20828 Contact with and (suspected) exposure to other viral communicable diseases: Secondary | ICD-10-CM | POA: Diagnosis present

## 2018-08-12 DIAGNOSIS — E785 Hyperlipidemia, unspecified: Secondary | ICD-10-CM | POA: Diagnosis present

## 2018-08-12 DIAGNOSIS — H9193 Unspecified hearing loss, bilateral: Secondary | ICD-10-CM | POA: Diagnosis present

## 2018-08-12 DIAGNOSIS — Z8249 Family history of ischemic heart disease and other diseases of the circulatory system: Secondary | ICD-10-CM | POA: Diagnosis not present

## 2018-08-12 DIAGNOSIS — I5022 Chronic systolic (congestive) heart failure: Secondary | ICD-10-CM | POA: Diagnosis present

## 2018-08-12 DIAGNOSIS — T501X5A Adverse effect of loop [high-ceiling] diuretics, initial encounter: Secondary | ICD-10-CM | POA: Diagnosis not present

## 2018-08-12 DIAGNOSIS — I255 Ischemic cardiomyopathy: Secondary | ICD-10-CM | POA: Diagnosis present

## 2018-08-12 DIAGNOSIS — I2510 Atherosclerotic heart disease of native coronary artery without angina pectoris: Secondary | ICD-10-CM | POA: Diagnosis present

## 2018-08-12 DIAGNOSIS — S40022A Contusion of left upper arm, initial encounter: Secondary | ICD-10-CM | POA: Diagnosis present

## 2018-08-12 DIAGNOSIS — Y929 Unspecified place or not applicable: Secondary | ICD-10-CM | POA: Diagnosis not present

## 2018-08-12 DIAGNOSIS — W19XXXA Unspecified fall, initial encounter: Secondary | ICD-10-CM | POA: Diagnosis not present

## 2018-08-12 DIAGNOSIS — I878 Other specified disorders of veins: Secondary | ICD-10-CM | POA: Diagnosis present

## 2018-08-12 DIAGNOSIS — I5043 Acute on chronic combined systolic (congestive) and diastolic (congestive) heart failure: Secondary | ICD-10-CM | POA: Diagnosis present

## 2018-08-12 DIAGNOSIS — I5023 Acute on chronic systolic (congestive) heart failure: Secondary | ICD-10-CM | POA: Diagnosis present

## 2018-08-12 DIAGNOSIS — R0609 Other forms of dyspnea: Secondary | ICD-10-CM

## 2018-08-12 DIAGNOSIS — R531 Weakness: Secondary | ICD-10-CM

## 2018-08-12 DIAGNOSIS — I130 Hypertensive heart and chronic kidney disease with heart failure and stage 1 through stage 4 chronic kidney disease, or unspecified chronic kidney disease: Secondary | ICD-10-CM | POA: Diagnosis present

## 2018-08-12 DIAGNOSIS — I4821 Permanent atrial fibrillation: Secondary | ICD-10-CM | POA: Diagnosis present

## 2018-08-12 LAB — TROPONIN I
Troponin I: 0.03 ng/mL (ref <0.03)
Troponin I: 0.03 ng/mL (ref <0.03)
Troponin I: 0.03 ng/mL (ref <0.03)

## 2018-08-12 LAB — SARS CORONAVIRUS 2 BY RT PCR (HOSPITAL ORDER, PERFORMED IN ~~LOC~~ HOSPITAL LAB): SARS Coronavirus 2: NEGATIVE

## 2018-08-12 MED ORDER — HYDROCORTISONE 1 % EX CREA
1.0000 "application " | TOPICAL_CREAM | Freq: Three times a day (TID) | CUTANEOUS | Status: DC | PRN
  Administered 2018-08-13 – 2018-08-17 (×6): 1 via TOPICAL
  Filled 2018-08-12 (×2): qty 28

## 2018-08-12 MED ORDER — HYDROCODONE-ACETAMINOPHEN 5-325 MG PO TABS
1.0000 | ORAL_TABLET | Freq: Four times a day (QID) | ORAL | Status: DC | PRN
  Administered 2018-08-12 – 2018-08-16 (×8): 1 via ORAL
  Filled 2018-08-12 (×8): qty 1

## 2018-08-12 MED ORDER — PANTOPRAZOLE SODIUM 40 MG PO TBEC
40.0000 mg | DELAYED_RELEASE_TABLET | Freq: Every day | ORAL | Status: DC
  Administered 2018-08-12 – 2018-08-17 (×6): 40 mg via ORAL
  Filled 2018-08-12 (×6): qty 1

## 2018-08-12 MED ORDER — MORPHINE SULFATE (PF) 2 MG/ML IV SOLN
1.0000 mg | INTRAVENOUS | Status: DC | PRN
  Administered 2018-08-12: 1 mg via INTRAVENOUS
  Filled 2018-08-12: qty 1

## 2018-08-12 MED ORDER — FERROUS SULFATE 325 (65 FE) MG PO TABS
325.0000 mg | ORAL_TABLET | Freq: Every day | ORAL | Status: DC
  Administered 2018-08-12 – 2018-08-17 (×6): 325 mg via ORAL
  Filled 2018-08-12 (×6): qty 1

## 2018-08-12 MED ORDER — SIMVASTATIN 20 MG PO TABS
20.0000 mg | ORAL_TABLET | Freq: Every day | ORAL | Status: DC
  Administered 2018-08-12 – 2018-08-16 (×5): 20 mg via ORAL
  Filled 2018-08-12 (×6): qty 1

## 2018-08-12 MED ORDER — ISOSORBIDE MONONITRATE ER 60 MG PO TB24
90.0000 mg | ORAL_TABLET | Freq: Every day | ORAL | Status: DC
  Administered 2018-08-12 – 2018-08-16 (×5): 90 mg via ORAL
  Filled 2018-08-12 (×5): qty 1

## 2018-08-12 MED ORDER — MAGNESIUM HYDROXIDE 400 MG/5ML PO SUSP: 30.0000 mL | Freq: Every day | ORAL | Status: DC | PRN

## 2018-08-12 MED ORDER — LOSARTAN POTASSIUM 50 MG PO TABS
50.0000 mg | ORAL_TABLET | Freq: Every day | ORAL | Status: DC
  Administered 2018-08-12 – 2018-08-14 (×3): 50 mg via ORAL
  Filled 2018-08-12 (×3): qty 1

## 2018-08-12 MED ORDER — APIXABAN 5 MG PO TABS
5.0000 mg | ORAL_TABLET | Freq: Two times a day (BID) | ORAL | Status: DC
  Administered 2018-08-12 – 2018-08-14 (×5): 5 mg via ORAL
  Filled 2018-08-12 (×5): qty 1

## 2018-08-12 MED ORDER — GUAIFENESIN-DM 100-10 MG/5ML PO SYRP
15.0000 mL | ORAL_SOLUTION | ORAL | Status: DC | PRN
  Administered 2018-08-12 – 2018-08-13 (×2): 15 mL via ORAL
  Filled 2018-08-12 (×2): qty 15

## 2018-08-12 MED ORDER — FUROSEMIDE 10 MG/ML IJ SOLN
40.0000 mg | Freq: Two times a day (BID) | INTRAMUSCULAR | Status: DC
  Administered 2018-08-12 – 2018-08-14 (×5): 40 mg via INTRAVENOUS
  Filled 2018-08-12 (×5): qty 4

## 2018-08-12 MED ORDER — ALUM & MAG HYDROXIDE-SIMETH 200-200-20 MG/5ML PO SUSP: 30.0000 mL | ORAL | Status: DC | PRN

## 2018-08-12 MED ORDER — SODIUM CHLORIDE 0.9% FLUSH
3.0000 mL | INTRAVENOUS | Status: DC | PRN
  Administered 2018-08-16 (×2): 3 mL via INTRAVENOUS
  Filled 2018-08-12 (×2): qty 3

## 2018-08-12 MED ORDER — VITAMIN B-12 1000 MCG PO TABS
500.0000 ug | ORAL_TABLET | Freq: Every day | ORAL | Status: DC
  Administered 2018-08-12 – 2018-08-17 (×6): 500 ug via ORAL
  Filled 2018-08-12 (×6): qty 1

## 2018-08-12 MED ORDER — FUROSEMIDE 10 MG/ML IJ SOLN
40.0000 mg | Freq: Once | INTRAMUSCULAR | Status: AC
  Administered 2018-08-12: 40 mg via INTRAVENOUS
  Filled 2018-08-12: qty 4

## 2018-08-12 MED ORDER — SODIUM CHLORIDE 0.9 % IV SOLN: 250.0000 mL | INTRAVENOUS | Status: DC | PRN

## 2018-08-12 MED ORDER — ACETAMINOPHEN 325 MG PO TABS: 650.0000 mg | ORAL_TABLET | ORAL | Status: DC | PRN

## 2018-08-12 MED ORDER — ONDANSETRON HCL 4 MG/2ML IJ SOLN: 4.0000 mg | Freq: Four times a day (QID) | INTRAMUSCULAR | Status: DC | PRN

## 2018-08-12 MED ORDER — SPIRONOLACTONE 12.5 MG HALF TABLET
12.5000 mg | ORAL_TABLET | Freq: Every day | ORAL | Status: DC
  Administered 2018-08-12 – 2018-08-14 (×3): 12.5 mg via ORAL
  Filled 2018-08-12 (×3): qty 1

## 2018-08-12 MED ORDER — CARVEDILOL 12.5 MG PO TABS
12.5000 mg | ORAL_TABLET | Freq: Every day | ORAL | Status: DC
  Administered 2018-08-12 – 2018-08-14 (×3): 12.5 mg via ORAL
  Filled 2018-08-12 (×3): qty 1

## 2018-08-12 MED ORDER — FENTANYL CITRATE (PF) 100 MCG/2ML IJ SOLN
25.0000 ug | INTRAMUSCULAR | Status: DC | PRN
  Administered 2018-08-12: 50 ug via INTRAVENOUS
  Filled 2018-08-12 (×2): qty 2

## 2018-08-12 MED ORDER — SODIUM CHLORIDE 0.9% FLUSH
3.0000 mL | Freq: Two times a day (BID) | INTRAVENOUS | Status: DC
  Administered 2018-08-12 – 2018-08-17 (×11): 3 mL via INTRAVENOUS

## 2018-08-12 MED ORDER — ALBUTEROL SULFATE (2.5 MG/3ML) 0.083% IN NEBU: 3.0000 mL | INHALATION_SOLUTION | Freq: Four times a day (QID) | RESPIRATORY_TRACT | Status: DC | PRN

## 2018-08-12 MED ORDER — FENTANYL CITRATE (PF) 100 MCG/2ML IJ SOLN
50.0000 ug | Freq: Once | INTRAMUSCULAR | Status: AC
  Administered 2018-08-12: 50 ug via INTRAVENOUS
  Filled 2018-08-12: qty 2

## 2018-08-12 MED ORDER — LOPERAMIDE HCL 2 MG PO CAPS: 2.0000 mg | ORAL_CAPSULE | ORAL | Status: DC | PRN

## 2018-08-12 NOTE — Progress Notes (Signed)
OT Cancellation Note  Patient Details Name: Thomas [name] MRN: [id number] DOB: [birth year]   Cancelled Treatment:    Reason Eval/Treat Not Completed: Other (comment)(waiting for in person ASL interpreter). Went in with written note to let the patient know that therapy would perform evaluation once in-person interpreter arrives. Pt agreeable. OT will continue to follow for evaluation.  [address] 08/12/2018, 8:46 AM   Hulda Humphrey OTR/L Acute Rehabilitation Services Pager: [phone] Office: [phone]

## 2018-08-12 NOTE — Evaluation (Signed)
Occupational Therapy Evaluation Patient Details Name: Thomas [name] MRN: [id number] DOB: 09-Aug-1934 Today's Date: 08/12/2018    History of Present Illness Thomas [name] is a 84 y.o. male with PMH including CHF,CABG in 1995, AICD / Biventricular PPM placement, A.Fib, HTN. Patient presented to the ED with c/o generalized weakness, increased BLE edema, increased SOB, 2 falls.  Symptoms ongoing over the past 1 month.     Clinical Impression   In-person Interpreter: Claiborne Billings used throughout the session. Pt reports that his PLOF was ambulating with RW, and he bathed himself independently in standing. His wife helped him get dressed. TOday was a VERY limited evaluation due to pain in his left shoulder (although whenever he would cough he would wince and grimace in pain indicating maybe it is more rib related). He was provided education on necessity of evaluation, ROM to work out pain/soreness, and Pt agreeable. He attempted to roll, and his pain jumped from a 7 to a 12, and he very strongly declined getting to the EOB or OOB at this time. Pt is able to access hand to face with RUE for grooming/feeding etc. Due to recent falls, and declining OOB at this time OT has no choice but to recommend SNF level post-acute for safety.    Follow Up Recommendations  SNF;Supervision/Assistance - 24 hour    Equipment Recommendations  3 in 1 bedside commode    Recommendations for Other Services       Precautions / Restrictions Precautions Precautions: Fall Restrictions Weight Bearing Restrictions: No      Mobility Bed Mobility Overal bed mobility: Needs Assistance Bed Mobility: Rolling Rolling: Max assist;+2 for physical assistance;+2 for safety/equipment         General bed mobility comments: Pt delined any bed mobility. He was a +2 max A for using the bed pads to scoot up in bed. He states that his shoulder hurts too much to move at all despite max education  Transfers                  General transfer comment: Pt provided with education about importance of moving, importance of evaluation, and max encouragement. Pt adamently declining all transfers due to L shoulder.     Balance Overall balance assessment: History of Falls                                         ADL either performed or assessed with clinical judgement   ADL Overall ADL's : Needs assistance/impaired Eating/Feeding: Minimal assistance;Bed level   Grooming: Moderate assistance;Bed level Grooming Details (indicate cue type and reason): for BUE tasks Upper Body Bathing: Maximal assistance;Bed level   Lower Body Bathing: Maximal assistance;Bed level   Upper Body Dressing : Maximal assistance;Bed level   Lower Body Dressing: Maximal assistance;Bed level     Toilet Transfer Details (indicate cue type and reason): declined         Functional mobility during ADLs: (declined) General ADL Comments: Pt is limited by pain in LUE/shoulder     Vision Baseline Vision/History: Wears glasses Wears Glasses: Reading only Patient Visual Report: No change from baseline Additional Comments: reports did not hit his head, no changes in vision.     Perception     Praxis      Pertinent Vitals/Pain Pain Assessment: 0-10 Pain Score: 7  Pain Location: L shoulder Pain Descriptors / Indicators: Aching;Discomfort;Grimacing;Guarding Pain Intervention(s):  Limited activity within patient's tolerance;Monitored during session;Repositioned     Hand Dominance Right   Extremity/Trunk Assessment Upper Extremity Assessment Upper Extremity Assessment: LUE deficits/detail LUE Deficits / Details: pain in shoulder, will not allow ROM of any sort LUE: Unable to fully assess due to pain LUE Coordination: decreased gross motor   Lower Extremity Assessment Lower Extremity Assessment: Defer to PT evaluation(BLE edema noted)       Communication Communication Communication: Prefers language other than  [place];Interpreter utilized   Cognition Arousal/Alertness: Awake/alert Behavior During Therapy: WFL for tasks assessed/performed Overall Cognitive Status: Within Functional Limits for tasks assessed                                     General Comments  ASL interpreter Claiborne Billings used throughout session    Exercises     Shoulder Instructions      Home Living Family/patient expects to be discharged to:: Private residence Living Arrangements: Spouse/significant other;Children Available Help at Discharge: Family;Available 24 hours/day Type of Home: House Home Access: Level entry     Home Layout: One level     Bathroom Shower/Tub: Teacher, early years/pre: Standard     Home Equipment: Environmental consultant - 2 wheels   Additional Comments: Lives with wife and adult son       Prior Functioning/Environment Level of Independence: Independent with assistive device(s)                 OT Problem List: Decreased range of motion;Decreased activity tolerance;Decreased safety awareness;Increased edema;Pain;Impaired UE functional use      OT Treatment/Interventions: Self-care/ADL training;Therapeutic activities;Patient/family education;Balance training;DME and/or AE instruction    OT Goals(Current goals can be found in the care plan section) Acute Rehab OT Goals Patient Stated Goal: decrease pain OT Goal Formulation: With patient Time For Goal Achievement: 08/26/18 Potential to Achieve Goals: Good  OT Frequency: Min 2X/week   Barriers to D/C:            Co-evaluation PT/OT/SLP Co-Evaluation/Treatment: Yes Reason for Co-Treatment: Necessary to address cognition/behavior during functional activity;For patient/therapist safety;To address functional/ADL transfers;Other (comment)(time limits of interpreter)   OT goals addressed during session: Strengthening/ROM;ADL's and self-care      AM-PAC OT "6 Clicks" Daily Activity     Outcome Measure Help from another  person eating meals?: A Little Help from another person taking care of personal grooming?: A Little Help from another person toileting, which includes using toliet, bedpan, or urinal?: A Lot Help from another person bathing (including washing, rinsing, drying)?: A Lot Help from another person to put on and taking off regular upper body clothing?: A Lot Help from another person to put on and taking off regular lower body clothing?: A Lot 6 Click Score: 14   End of Session Equipment Utilized During Treatment: Oxygen Nurse Communication: Mobility status;Patient requests pain meds  Activity Tolerance: Patient limited by pain Patient left: in bed;with bed alarm set;with call bell/phone within reach;with nursing/sitter in room;Other (comment)(ASL interpreter Claiborne Billings in room)  OT Visit Diagnosis: Repeated falls (R29.6);Pain;History of falling (Z91.81) Pain - Right/Left: Left Pain - part of body: Shoulder                Time: 1610-9604 OT Time Calculation (min): 23 min Charges:  OT General Charges $OT Visit: 1 Visit OT Evaluation $OT Eval Moderate Complexity: [place] Pager: [phone] Office: [phone]  [place] 08/12/2018, 10:33 AM

## 2018-08-12 NOTE — Progress Notes (Signed)
Pt was received on 3E7 this morning, alert.  Pt is deaf and requires a in person translator.  No signs of distress noted. Pt connected to telemetry and pericare provided, due to incontinence during transport.  Pt is comfortable using urinal at bedside.  RN attempted to notify CAP and left contact information to request translator.  CAP information has been passed on to [place], [employer]

## 2018-08-12 NOTE — Progress Notes (Signed)
   Asked [employer] rep Dannial Monarch) to help interrogate patient's device given recent falls. Interrogation placed in shadow chart.   In summary: Interrogation showed that device is working fine. It shows possible recent fluid accumulation ongoing since 07/07/2018. Patient had 9 beats of non-sustained VT on 08/04/2018. No other episodes.   Will continue with current plan.   Darreld Mclean, PA-C 08/12/2018 5:10 PM

## 2018-08-12 NOTE — TOC Initial Note (Signed)
Transition of Care [facility] - Initial/Assessment Note    Patient Details  Name: Thomas [name] MRN: [id number] Date of Birth: [birth year]  Transition of [facility]) CM/SW Contact:    Royston Bake, RN Phone Number: 08/12/2018, 12:26 PM  Clinical Narrative:                 CM talked to patient with a sign language interpreter at the bedside. Patient lives at home with spouse,she takes him to his apts, errands and assist as needed; PCP: [name], MD; has private insurance with [employer] with prescription drug coverage; [facility] [address] [facility] [place]; pt reports no problem getting his medication. DME - walker, cane and bedside commode at home; patient is refusing all [place] services at this time. CM will continue to follow for progression of care.   Expected Discharge Plan: Home/Self Care Barriers to Discharge: No Barriers Identified   Patient Goals and CMS Choice Patient states their goals for this hospitalization and ongoing recovery are:: to get better CMS Medicare.gov Compare Post Acute Care list provided to:: Patient Choice offered to / list presented to : Patient  Expected Discharge Plan and Services Expected Discharge Plan: Home/Self Care In-house Referral: NA Discharge Planning Services: CM Consult Post Acute Care Choice: NA Living arrangements for the past 2 months: Single Family Home                 DME Arranged: N/A DME Agency: NA       HH Arranged: Patient Refused [place] Agency: NA        Prior Living Arrangements/Services Living arrangements for the past 2 months: Single Family Home Lives with:: Spouse Patient language and need for interpreter reviewed:: Yes(Patient is Deaf, Person at the bedside for sign language ) Do you feel safe going back to the place where you live?: Yes      Need for Family Participation in Patient Care: Yes (Comment) Care giver support system in place?: Yes (comment)   Criminal Activity/Legal  Involvement Pertinent to Current Situation/Hospitalization: No - Comment as needed  Activities of Daily Living Home Assistive Devices/Equipment: Environmental consultant (specify type), Cane (specify quad or straight), Eyeglasses, Grab bars around toilet, Grab bars in shower ADL Screening (condition at time of admission) Patient's cognitive ability adequate to safely complete daily activities?: Yes Is the patient deaf or have difficulty hearing?: Yes Does the patient have difficulty seeing, even when wearing glasses/contacts?: No Does the patient have difficulty concentrating, remembering, or making decisions?: Yes Patient able to express need for assistance with ADLs?: Yes Does the patient have difficulty dressing or bathing?: Yes Independently performs ADLs?: No Communication: Independent Dressing (OT): Needs assistance Grooming: Needs assistance Is this a change from baseline?: Pre-admission baseline Feeding: Needs assistance Bathing: Needs assistance Is this a change from baseline?: Pre-admission baseline Toileting: Needs assistance In/Out Bed: Needs assistance Is this a change from baseline?: Pre-admission baseline Walks in Home: Independent with device (comment) Does the patient have difficulty walking or climbing stairs?: Yes Weakness of Legs: Both Weakness of Arms/Hands: Left  Permission Sought/Granted Permission sought to share information with : Case Manager Permission granted to share information with : Yes, Verbal Permission Granted  Share Information with NAME: spouse           Emotional Assessment Appearance:: Developmentally appropriate Attitude/Demeanor/Rapport: Gracious Affect (typically observed): Accepting Orientation: : Oriented to Self, Oriented to  Time, Oriented to Place, Oriented to Situation Alcohol / Substance Use: Other (comment) Psych  Involvement: No (comment)  Admission diagnosis:  Peripheral edema [R60.9] DOE (dyspnea on exertion) [R06.09] Generalized weakness  [R53.1] Fall, initial encounter [W19.XXXA] Patient Active Problem List   Diagnosis Date Noted  . Acute on chronic combined systolic and diastolic CHF (congestive heart failure) ([place])   . GI bleed 05/26/2018  . Occult GI bleeding 05/25/2018  . Normocytic anemia 05/25/2018  . Elevated troponin 05/25/2018  . Anemia   . Lymphedema 02/28/2018  . Weakness 07/15/2016  . Fatigue 07/15/2016  . CVA (cerebral infarction) 10/31/2015  . Bulbous urethral stricture 09/18/2015  . Bilateral deafness 08/19/2015  . Bilateral cataracts 08/19/2015  . Acid reflux 08/19/2015  . HLD (hyperlipidemia) 08/19/2015  . BP (high blood pressure) 08/19/2015  . Myocardial infarction ([place]) 08/19/2015  . Calculus of kidney 08/19/2015  . Artificial cardiac pacemaker 08/19/2015  . Gastroduodenal ulcer 08/19/2015  . Dupuytren's contracture of foot 08/19/2015  . Malignant neoplasm of prostate ([place]) 08/19/2015  . Microhematuria 08/19/2015  . Coronary artery disease 02/22/2015  . Status post coronary artery bypass grafting 02/22/2015  . Benign essential HTN 04/02/2014  . Hematochezia 02/20/2014  . Moderate tricuspid regurgitation 02/20/2014  . Mobitz type II atrioventricular block 12/12/2013  . Long term current use of anticoagulant 10/18/2013  . Cardiomyopathy, ischemic: Ejection fraction 45% 11/11/2012  . CAD Status post coronary artery bypass grafting: 1995 11/11/2012  . Permanent atrial fibrillation ([place]) 11/11/2012  . Biventricular ICD Medtronic Viva single chamber October 2014 11/11/2012  . Chronic systolic heart failure ([place]) 11/11/2012   PCP:  [name], MD Pharmacy:   [id number] [place] [place] [address] [place] [address] Phone: [phone] Fax: [phone]     Social Determinants of Health (SDOH) Interventions    Readmission Risk Interventions No flowsheet data found.

## 2018-08-12 NOTE — Consult Note (Addendum)
Cardiology Consult    Patient ID: ELAI VANWYK MRN: [id number], DOB/AGE: 1934-05-15   Admit date: 08/11/2018 Date of Consult: 08/12/2018  Primary Physician: [name], MD Primary Cardiologist: Sanda Klein, MD  Primary Electrophysiologist: Cristopher Peru, MD Requesting Provider: Oretha Milch, MD  Patient Profile    CAILEAN HEACOCK is a 84 y.o. deaf male with a history of CAD s/p remote CABG in 1986 with subsequent stenting, CHF/ ischemic cardiomyopathy s/p BiV ICD, paroxysmal atrial fibrillation on Eliquis, hypertension, prostate cancer, CKD stage III, who is being seen today for the evaluation of acute CHF at the request of Dr. Lonny Prude.  History of Present Illness    Mr. Wurzer is a 84 year old deaf male with the above history who is followed by Dr. Sallyanne Kuster and Dr. [name]. Patient was admitted from 05/25/2018 to 05/28/2018 for occult GI bleeding and symptomatic anemia after presenting with fatigue, malaise, and increased dyspnea. EGD showed a erythematous duodenoum. Eliquis was placed on hold for 1 week and patient was started on PPI. Patient was initially thought to be volume overloaded but then became hypotensive and developed AKI. His Losartan/HCTZ/Lasix were discontinued with plans to follow-up as outpatient regarding possible resumption. Troponin was low and flat during that admission but was not felt to be ACS. Patient had an outpatient colonoscopy on 07/13/2018 which showed non-bleeding internal hemorrhoid. Patient recently seen by Dr. [name] on 07/20/2018 for follow up and management of ICD at which time he was doing well. He denied any anginal symptoms or ICD shocks at that time. He did report shortness of breath and edema. Patient had some confusion about which medications he should be taking and had not been taking his Lasix. Therefore, Lasix was restarted. Losartan was also restarted at some point.  Patient's wife called our office yesterday reporting patient has been have some  shortness of breath and weakness and had fallen twice in the last week. Patient was set up for an appointment with Kerin Ransom, PA-C; however, patient ended up going to the ED instead.  Patient reports he has fallen twice recently including once on the day of admission. Patient states he was a little dizzy prior to fallen but thinks he really just lost his balance. Patient denies any ICD shocks and syncope. The last time he fell, he hit his left shoulder and back. Patient reports some shortness of breath for the past 3 days but states it worsened after the fall. Shortness of breath occurs at rest and with exertion. He denies any orthopnea. He has some chronic lower extremity edema but states it has worsened. Patient denies any weight gain and states he has actually lost 5 lbs gradually. Patient has left shoulder/back pain due to the fall but denies any chest pain. Patient has had a productive cough for a while but denies any fevers or hemoptysis. Patient admits that he sometimes forgets to take his Lasix.  In the ED, vitals stable. EKG showed ventricular paced rhythm. Initial troponin borderline positive at 0.03. BNP elevated at 316.9. Chest x-ray showed stable small left pleural effusion and left basilar atelectasis. WBC 7.9, Hgb 8.2, Plts 301. Na 140, K 4.5, Glucose 121, SCr 1.26. COVID-19 testing negative. Patient admitted for further evaluation.  Patient states he feels better after being started on IV Lasix.  Of note, history was obtained using interpreter Lenox Ahr).   Past Medical History   Past Medical History:  Diagnosis Date   AICD (automatic cardioverter/defibrillator) present    Biventricular ICD (  implantable cardioverter-defibrillator) in place 03/24/2005   Implantation of a Medtronic Adapta ADDRO1, serial number [id number]   CHF (congestive heart failure) (HCC)    CKD (chronic kidney disease), stage III ([place])    Coronary artery disease    a. s/p CABG 1986. b. Multiple  PCIs/caths. c. 09/2013: s/p PTCA and BMS to SVG-OM.   Deaf    Dysrhythmia    History of abdominal aortic aneurysm    History of bleeding peptic ulcer 1980   History of epididymitis 2013   HTN (hypertension)    Hydronephrosis with ureteropelvic junction obstruction    Hydroureter on left 2009   Hypertension    Ischemic cardiomyopathy    a. Prior EF 30-35%, s/p BIV-ICD. b. 09/2013: EF 45-50%.   Moderate tricuspid regurgitation    PAF (paroxysmal atrial fibrillation) (HCC)    Presence of permanent cardiac pacemaker    Prostate cancer [facility])    Status post coronary artery bypass grafting 1986   LIMA to the LAD, SVG to OM, SVG to RCA   Testicular swelling     Past Surgical History:  Procedure Laterality Date   2-D echocardiogram  11/20/2011   Ejection fraction 30-35% moderate concentric left ventricular hypertrophy. Left atrium is moderately dilated. Mild MR. Mild or   BI-VENTRICULAR IMPLANTABLE CARDIOVERTER DEFIBRILLATOR N/A 12/16/2012   Procedure: BI-VENTRICULAR IMPLANTABLE CARDIOVERTER DEFIBRILLATOR  (CRT-D);  Surgeon: [name], MD;  Location: [facility] CATH LAB;  Service: Cardiovascular;  Laterality: N/A;   CARDIAC CATHETERIZATION  12/10/2011   SVG to OM widely patent.  LIMA to LAD patent   CATARACT EXTRACTION W/PHACO Right 10/12/2017   Procedure: CATARACT EXTRACTION PHACO AND INTRAOCULAR LENS PLACEMENT (IOC);  Surgeon: Birder Robson, MD;  Location: [facility] ORS;  Service: Ophthalmology;  Laterality: Right;  [place] 00:57 AP% 15.9 CDE 9.07 Fluid pack lot # [id number]   COLONOSCOPY N/A 07/13/2018   Procedure: COLONOSCOPY;  Surgeon: Toledo, Benay Pike, MD;  Location: [facility] ENDOSCOPY;  Service: Gastroenterology;  Laterality: N/A;   CORONARY ARTERY BYPASS GRAFT  1986   ESOPHAGOGASTRODUODENOSCOPY N/A 07/13/2018   Procedure: ESOPHAGOGASTRODUODENOSCOPY (EGD);  Surgeon: Toledo, Benay Pike, MD;  Location: [facility] ENDOSCOPY;  Service: Gastroenterology;  Laterality: N/A;    ESOPHAGOGASTRODUODENOSCOPY (EGD) WITH PROPOFOL N/A 05/27/2018   Procedure: ESOPHAGOGASTRODUODENOSCOPY (EGD) WITH PROPOFOL;  Surgeon: Clarene Essex, MD;  Location: [place];  Service: Endoscopy;  Laterality: N/A;   INSERT / REPLACE / REMOVE PACEMAKER     LEFT HEART CATHETERIZATION WITH CORONARY/GRAFT ANGIOGRAM N/A 12/10/2011   Procedure: LEFT HEART CATHETERIZATION WITH Beatrix Fetters;  Surgeon: Sanda Klein, MD;  Location: [place] CATH LAB;  Service: Cardiovascular;  Laterality: N/A;   LEFT HEART CATHETERIZATION WITH CORONARY/GRAFT ANGIOGRAM N/A 09/25/2013   Procedure: LEFT HEART CATHETERIZATION WITH Beatrix Fetters;  Surgeon: Blane Ohara, MD;  Location[facility] CATH LAB;  Service: Cardiovascular;  Laterality: N/A;   Persantine Myoview  05/06/2010   Post-rest ejection fraction 30%. No significant ischemia demonstrated. Compared to previous study there is no significant change.   TRANSURETHRAL RESECTION OF PROSTATE     s/p     Allergies  Allergies  Allergen Reactions   Phenazopyridine Nausea Only and Other (See Comments)    GI UPSET   Ramipril Other (See Comments)    unk    Inpatient Medications     apixaban  5 mg Oral BID   carvedilol  12.5 mg Oral Q breakfast   ferrous sulfate  325 mg Oral Daily   furosemide  40 mg Intravenous BID   isosorbide mononitrate  90 mg Oral Daily   losartan  50 mg Oral Daily   pantoprazole  40 mg Oral Daily   simvastatin  20 mg Oral q1800   sodium chloride flush  3 mL Intravenous Q12H   vitamin B-12  500 mcg Oral Daily    Family History    Family History  Problem Relation Age of Onset   Hypertension Father    He indicated that his mother is deceased. He indicated that his father is deceased.   Social History    Social History   Socioeconomic History   Marital status: Married    Spouse name: Not on file   Number of children: Not on file   Years of education: Not on file   Highest education level: Not  on file  Occupational History   Not on file  Social Needs   Financial resource strain: Not on file   Food insecurity:    Worry: Not on file    Inability: Not on file   Transportation needs:    Medical: Not on file    Non-medical: Not on file  Tobacco Use   Smoking status: Former Smoker    Last attempt to quit: 03/15/1985    Years since quitting: 33.4   Smokeless tobacco: Never Used  Substance and Sexual Activity   Alcohol use: No    Comment: occas.   Drug use: No   Sexual activity: Yes  Lifestyle   Physical activity:    Days per week: Not on file    Minutes per session: Not on file   Stress: Not on file  Relationships   Social connections:    Talks on phone: Not on file    Gets together: Not on file    Attends religious service: Not on file    Active member of club or organization: Not on file    Attends meetings of clubs or organizations: Not on file    Relationship status: Not on file   Intimate partner violence:    Fear of current or ex partner: Not on file    Emotionally abused: Not on file    Physically abused: Not on file    Forced sexual activity: Not on file  Other Topics Concern   Not on file  Social History Narrative   Not on file     Review of Systems    Please see HPI. Otherwise, negative.  Physical Exam    Blood pressure 132/66, pulse 65, temperature 98.2 F (36.8 C), temperature source Oral, resp. rate 16, height 6\' 2"  (1.88 m), weight 112 kg, SpO2 94 %.   Physical Exam per MD:  General: 84 y.o. male resting comfortably in no acute distress. Pleasant and cooperative. HEENT: Normal. Neck: Supple.  Lungs: No increased work of breathing. Clear to auscultation bilaterally. No wheezes, rhonchi, or rales. Heart: RRR. Distinct S1 and S2. II/VI systolic murmur at apex (consistent with MR). No gallops or rubs.  Abdomen: Soft, non-distended, and non-tender to palpation. Bowel sounds present in all 4 quadrants.   Extremities: 2-3+  pitting edema of bilateral lower extremities.  Skin: Warm and dry. Neuro: Alert and oriented x3. No focal deficits. Moves all extremities spontaneously. Psych: Normal affect.  Labs    Troponin (Point of Care Test) No results for input(s): TROPIPOC in the last 72 hours. Recent Labs    08/11/18 2246 08/12/18 0613  TROPONINI 0.03* 0.03*   Lab Results  Component Value Date   WBC 7.9 08/11/2018   HGB  8.2 (L) 08/11/2018   HCT 27.8 (L) 08/11/2018   MCV 77.9 (L) 08/11/2018   PLT 301 08/11/2018    Recent Labs  Lab 08/11/18 2000  NA 140  K 4.5  CL 109  CO2 26  BUN 29*  CREATININE 1.26*  CALCIUM 8.4*  GLUCOSE 121*   Lab Results  Component Value Date   CHOL 92 11/01/2015   HDL 31 (L) 11/01/2015   LDLCALC 46 11/01/2015   TRIG 76 11/01/2015   No results found for: [facility]   Radiology Studies    Dg Ribs Unilateral W/chest Left  Result Date: 08/12/2018 CLINICAL DATA:  Recent falls with left-sided chest pain, initial encounter EXAM: LEFT RIBS AND CHEST - 3+ VIEW COMPARISON:  05/25/2018 FINDINGS: Cardiac shadow is enlarged. Defibrillator is again seen as well as postoperative changes. Lungs are well aerated bilaterally. Stable small left pleural effusion is noted as well as left basilar atelectasis. No acute rib abnormality is noted. IMPRESSION: No acute rib abnormality is seen. Stable small left pleural effusion and left basilar atelectasis. Electronically Signed   By: Inez Catalina M.D.   On: 08/12/2018 00:59   Ct Head Wo Contrast  Result Date: 08/12/2018 CLINICAL DATA:  Recent falls with blood thinners EXAM: CT HEAD WITHOUT CONTRAST TECHNIQUE: Contiguous axial images were obtained from the base of the skull through the vertex without intravenous contrast. COMPARISON:  05/19/2018 FINDINGS: Brain: Chronic atrophic and ischemic changes are noted. Lacunar infarcts are noted within the left thalamus better visualized than on the prior exam. No findings to suggest acute hemorrhage, acute  infarction or space-occupying mass lesion are noted. Vascular: No hyperdense vessel or unexpected calcification. Skull: Normal. Negative for fracture or focal lesion. Sinuses/Orbits: No acute finding. Other: None. IMPRESSION: Chronic atrophic and ischemic changes. 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 No acute abnormality noted. Electronically Signed   By: Inez Catalina M.D.   On: 08/12/2018 02:06   Dg Humerus Left  Result Date: 08/12/2018 CLINICAL DATA:  Increasing falls over last 2 weeks with left arm pain, initial encounter EXAM: LEFT HUMERUS - 2+ VIEW COMPARISON:  None. FINDINGS: Degenerative changes of the acromioclavicular joint are noted. No acute fracture or dislocation is noted. No soft tissue abnormality is seen. IMPRESSION: No acute abnormality noted. Electronically Signed   By: Inez Catalina M.D.   On: 08/12/2018 00:57    EKG     EKG: EKG was personally reviewed and demonstrates: Ventricular paced with rate of 60 bpm.   Telemetry: Telemetry was personally reviewed and demonstrates: Ventricular paced rhythm.  Cardiac Imaging    Echocardiogram 05/02/2018: 1. The left ventricle has moderately reduced systolic function, with an ejection fraction of 35-40%. The cavity size was moderately dilated. Left ventricular diastology could not be evaluated.  2. The right ventricle has mildly reduced systolic function. The cavity was moderately enlarged. There is no increase in right ventricular wall thickness.  3. Left atrial size was severely dilated.  4. Right atrial size was severely dilated.  5. The mitral valve is normal in structure. Mitral valve regurgitation is mild to moderate by color flow Doppler. The MR jet is eccentric posteriorly directed.  6. The tricuspid valve is normal in structure.  7. The aortic valve is tricuspid Aortic valve regurgitation is mild by color flow Doppler.  8. The pulmonic valve was normal in structure.  9. There is moderate dilatation of the aortic  root and of the ascending aorta. 10. Moderately dilated pulmonary artery. 11. Pulmonary hypertension  is moderate. 12. The inferior vena cava was dilated in size with <50% respiratory variability. 13. Right atrial pressure is estimated at 15 mmHg.  Assessment & Plan    Acute on Chronic CHF/ Ischemic Cardiomyopathy s/p ICD - Patient presented with generalized weakness and shortness of breath. - Chest x-ray showed stable small left pleural effusion and left basilar atelectasis.  - BNP elevated at 316.9.  - Most recent Echo from 05/02/2018 showed moderately dilated LV with EF of 35-40%. RV was also moderately enlarged with mildly reduced systolic function. - Patietn was started on IV Lasix 40mg  twice daily. Documented output of 2 L in the past 24 hours.  - Continue current dose of Lasix. - Continue home Losartan 50 mg daily and Coreg 12.5 mg twice daily. - Will add Spironolactone 12.5mg  daily. - Continue to monitor daily weights, strict I/O's, and renal function.   Elevated Troponin - Troponin borderline positive and flat at 0.03 >> 0.03. Not consistent with ACS. Per chart review, every troponin in our system has been positive at least in the 0.03 to 0.06 range.  Paroxysmal Atrial Fibrillation - Telemetry shows ventricular paced rhythm. - Continue Coreg as above. - Continue Eliquis.   Hypertension - BP currently well controlled. - Continue Losartan and Coreg as above.  Fall  - Patient has had a couple of falls over the last few days due to weakness. Patient reports some dizziness prior to the fall but denies but thinks he just lost his balance. He denies any ICD shocks or syncope. - Will interrogate ICD.  Otherwise, per primary team.  Signed, Darreld Mclean, PA-C 08/12/2018, 11:45 AM Pager: [phone] For questions or updates, please contact   Please consult [email or web address] for contact info under Cardiology/STEMI. As above, patient seen and examined.  Briefly he is an  84 year old male with past medical history of coronary artery disease status post prior coronary artery bypass graft, ischemic cardiomyopathy, chronic combined systolic/diastolic congestive heart failure, paroxysmal atrial fibrillation, hypertension, chronic stage III kidney disease, prostate cancer, deafness for evaluation of acute on chronic combined systolic/diastolic congestive heart failure.  History was obtained with the assistance of an interpreter.  5 days ago the patient fell stating he lost his balance and was dizzy.  He did not have syncope nor was there ICD discharge.  He has had pain in his left arm and left back since that time.  He also had some increased dyspnea on exertion.  He denies orthopnea, PND or other chest pain.  He noticed worsening bilateral lower extremity edema.  He was admitted and cardiology asked to evaluate.  Some improvement since admission.  He occasionally misses his Lasix as he states he forgets. BNP 317.  Troponin 0 0.03, 0.03 and 0.03.  Creatinine 1.26.  Hemoglobin 8.2. ECG personally reviewed and shows ventricular pacing with underlying atrial fibrillation.  Patient presents with acute on chronic combined systolic/diastolic congestive heart failure.  Most recent echocardiogram February 2020 showed ejection fraction 35 to 40%, severe biatrial enlargement, mild to moderate mitral regurgitation, mild aortic insufficiency and moderate dilatation of the aortic root.  Agree with Lasix 40 mg IV twice daily.  Continue losartan and carvedilol.  Add spironolactone 12.5 mg daily.  Follow renal function closely.  Needs fluid restriction and low-sodium diet.  Continue apixaban for atrial fibrillation.  Continue statin for coronary disease.  He is not on aspirin given need for anticoagulation.  Follow hemoglobin as patient has a microcytic anemia.  He has had recent evaluation with endoscopy.  Other issues per primary care. Will have ICD interrogated.   Kirk Ruths, MD

## 2018-08-12 NOTE — Evaluation (Signed)
Physical Therapy Evaluation Patient Details Name: Thomas [name] MRN: [id number] DOB: 04/16/1934 Today's Date: 08/12/2018   History of Present Illness  Thomas [name] is a 84 y.o. male with PMH including CHF,CABG in 1995, AICD / Biventricular PPM placement, A.Fib, HTN. Patient presented to the ED with c/o generalized weakness, increased BLE edema, increased SOB, 2 falls.  Symptoms ongoing over the past 1 month.      Clinical Impression  Patient admitted with the above listed diagnosis. ASL interpreter, Claiborne Billings, present for duration of session. Patient reporting pain at L shoulder, however, as patient would cough he would grimace and moan in pain while reach for chest indicating possible rib/chest pain. Patient only lifting B LE in bed and adamantly refusing all other mobility. Max motivation and education provided on importance of mobility and overall goals of evaluation from PT with patient continuing to decline mobility. Patient required total A+2 for repositioning in bed. Currently recommending SNF at discharge due to currently mobility levels and inability to evaluate safe mobility needed to return home. PT to continue to follow.     Follow Up Recommendations SNF    Equipment Recommendations  None recommended by PT    Recommendations for Other Services       Precautions / Restrictions Precautions Precautions: Fall Restrictions Weight Bearing Restrictions: No      Mobility  Bed Mobility Overal bed mobility: Needs Assistance Bed Mobility: Rolling Rolling: Max assist;+2 for physical assistance;+2 for safety/equipment         General bed mobility comments: Pt delined any bed mobility. He was a +2 max A for using the bed pads to scoot up in bed. He states that his shoulder hurts too much to move at all despite max education  Transfers                 General transfer comment: Pt provided with education about importance of moving, importance of evaluation, and max  encouragement. Pt adamently declining all transfers due to L shoulder.   Ambulation/Gait             General Gait Details: deferred  Stairs            Wheelchair Mobility    Modified Rankin (Stroke Patients Only)       Balance Overall balance assessment: History of Falls                                           Pertinent Vitals/Pain Pain Assessment: 0-10 Pain Score: 7  Pain Location: L shoulder Pain Descriptors / Indicators: Aching;Discomfort;Grimacing;Guarding Pain Intervention(s): Limited activity within patient's tolerance;Monitored during session;Repositioned    Home Living Family/patient expects to be discharged to:: Private residence Living Arrangements: Spouse/significant other;Children Available Help at Discharge: Family;Available 24 hours/day Type of Home: House Home Access: Level entry     Home Layout: One level Home Equipment: Walker - 2 wheels Additional Comments: Lives with wife and adult son     Prior Function Level of Independence: Independent with assistive device(s)               Hand Dominance   Dominant Hand: Right    Extremity/Trunk Assessment   Upper Extremity Assessment Upper Extremity Assessment: LUE deficits/detail LUE Deficits / Details: pain in shoulder, will not allow ROM of any sort LUE: Unable to fully assess due to pain LUE Coordination: decreased gross motor  Lower Extremity Assessment Lower Extremity Assessment: (able to lift BLE against gravity, B LE edema noted)       Communication   Communication: Prefers language other than [place];Interpreter utilized  Cognition Arousal/Alertness: Awake/alert Behavior During Therapy: WFL for tasks assessed/performed Overall Cognitive Status: Within Functional Limits for tasks assessed                                        General Comments General comments (skin integrity, edema, etc.): ASL interpreter Claiborne Billings used throughout  session    Exercises     Assessment/Plan    PT Assessment Patient needs continued PT services  PT Problem List Decreased strength;Decreased balance;Decreased activity tolerance;Decreased mobility;Decreased knowledge of use of DME;Decreased safety awareness       PT Treatment Interventions DME instruction;Gait training;Functional mobility training;Therapeutic activities;Therapeutic exercise;Balance training;Patient/family education    PT Goals (Current goals can be found in the Care Plan section)  Acute Rehab PT Goals Patient Stated Goal: decrease pain PT Goal Formulation: With patient Time For Goal Achievement: 08/26/18 Potential to Achieve Goals: Good    Frequency Min 3X/week   Barriers to discharge        Co-evaluation PT/OT/SLP Co-Evaluation/Treatment: Yes Reason for Co-Treatment: Necessary to address cognition/behavior during functional activity;For patient/therapist safety;To address functional/ADL transfers(time limits of interpreter) PT goals addressed during session: Mobility/safety with mobility OT goals addressed during session: Strengthening/ROM;ADL's and self-care       AM-PAC PT "6 Clicks" Mobility  Outcome Measure Help needed turning from your back to your side while in a flat bed without using bedrails?: Total Help needed moving from lying on your back to sitting on the side of a flat bed without using bedrails?: Total Help needed moving to and from a bed to a chair (including a wheelchair)?: Total Help needed standing up from a chair using your arms (e.g., wheelchair or bedside chair)?: Total Help needed to walk in hospital room?: Total Help needed climbing 3-5 steps with a railing? : Total 6 Click Score: 6    End of Session   Activity Tolerance: Patient limited by pain Patient left: in bed;with call bell/phone within reach;with bed alarm set;with nursing/sitter in room Nurse Communication: Mobility status PT Visit Diagnosis: Unsteadiness on feet  (R26.81);Other abnormalities of gait and mobility (R26.89);Muscle weakness (generalized) (M62.81);History of falling (Z91.81)    Time: 3154-0086 PT Time Calculation (min) (ACUTE ONLY): 22 min   Charges:   PT Evaluation $PT Eval Moderate Complexity: 1 Mod         Lanney Gins, PT, DPT Supplemental Physical Therapist 08/12/18 11:21 AM Pager: [phone] Office: [phone]

## 2018-08-12 NOTE — Care Management Obs Status (Signed)
[facility] NOTIFICATION   Patient Details  Name: Thomas [name] MRN: [id number] Date of Birth: [birth year]   Medicare Observation Status Notification Given:  Yes(Letter explaned via sign language professional;  all questions answered)    Royston Bake, RN 08/12/2018, 12:32 PM

## 2018-08-12 NOTE — H&P (Signed)
History and Physical    Thomas [name] NAT:[id number] DOB: 1934-08-03 DOA: 08/11/2018  PCP: [name], MD  Patient coming from: Home  I have personally briefly reviewed patient's old medical records in [place]  Chief Complaint: Weakness, Falls  HPI: Thomas [name] is a 84 y.o. male with medical history significant of CHF dt ICM with EF 35-40% on most recent echo in Feb this year, he is s/p 3x CABG in 1995 and AICD / Biventricular PPM placement.  He also has A.Fib on eliquis and HTN.  Patient presents to the ED with c/o generalized weakness, increased BLE edema, increased SOB.  Symptoms ongoing over the past 1 month.  Today he had 2 falls which is what ultimately prompted him to present to the ED.  No sick contacts, no fever, no urinary symptoms.  Pain in L arm and ribs where he fell this AM.   ED Course: BNP 316, trop 0.03.  No broken bones on rib and arm X rays.  Small L pleural effusion.  Given 40mg  lasix x1, hospitalist asked to admit.   Review of Systems: As per HPI otherwise 10 point review of systems negative.   Past Medical History:  Diagnosis Date  . AICD (automatic cardioverter/defibrillator) present   . Biventricular ICD (implantable cardioverter-defibrillator) in place 03/24/2005   Implantation of a Medtronic Adapta ADDRO1, serial number [id number]  . CHF (congestive heart failure) ([place])   . CKD (chronic kidney disease), stage III ([place])   . Coronary artery disease    a. s/p CABG 1986. b. Multiple PCIs/caths. c. 09/2013: s/p PTCA and BMS to SVG-OM.  [name]   . Dysrhythmia   . History of abdominal aortic aneurysm   . History of bleeding peptic ulcer 1980  . History of epididymitis 2013  . HTN (hypertension)   . Hydronephrosis with ureteropelvic junction obstruction   . Hydroureter on left 2009  . Hypertension   . Ischemic cardiomyopathy    a. Prior EF 30-35%, s/p BIV-ICD. b. 09/2013: EF 45-50%.  . Moderate tricuspid regurgitation   . PAF  (paroxysmal atrial fibrillation) ([place])   . Presence of permanent cardiac pacemaker   . Prostate cancer ([place])   . Status post coronary artery bypass grafting 1986   LIMA to the LAD, SVG to OM, SVG to RCA  . Testicular swelling     Past Surgical History:  Procedure Laterality Date  . 2-D echocardiogram  11/20/2011   Ejection fraction 30-35% moderate concentric left ventricular hypertrophy. Left atrium is moderately dilated. Mild MR. Mild or  . BI-VENTRICULAR IMPLANTABLE CARDIOVERTER DEFIBRILLATOR N/A 12/16/2012   Procedure: BI-VENTRICULAR IMPLANTABLE CARDIOVERTER DEFIBRILLATOR  (CRT-D);  Surgeon: [name], MD;  Location: [facility] CATH LAB;  Service: Cardiovascular;  Laterality: N/A;  . CARDIAC CATHETERIZATION  12/10/2011   SVG to OM widely patent.  LIMA to LAD patent  . CATARACT EXTRACTION W/PHACO Right 10/12/2017   Procedure: CATARACT EXTRACTION PHACO AND INTRAOCULAR LENS PLACEMENT (IOC);  Surgeon: Birder Robson, MD;  Location: [facility] ORS;  Service: Ophthalmology;  Laterality: Right;  [place] 00:57 AP% 15.9 CDE 9.07 Fluid pack lot # [id number]  . COLONOSCOPY N/A 07/13/2018   Procedure: COLONOSCOPY;  Surgeon: Toledo, Benay Pike, MD;  Location: [facility] ENDOSCOPY;  Service: Gastroenterology;  Laterality: N/A;  . CORONARY ARTERY BYPASS GRAFT  1986  . ESOPHAGOGASTRODUODENOSCOPY N/A 07/13/2018   Procedure: ESOPHAGOGASTRODUODENOSCOPY (EGD);  Surgeon: Toledo, Benay Pike, MD;  Location: [facility] ENDOSCOPY;  Service: Gastroenterology;  Laterality: N/A;  . ESOPHAGOGASTRODUODENOSCOPY (EGD) WITH PROPOFOL  N/A 05/27/2018   Procedure: ESOPHAGOGASTRODUODENOSCOPY (EGD) WITH PROPOFOL;  Surgeon: Clarene Essex, MD;  Location: [place];  Service: Endoscopy;  Laterality: N/A;  . INSERT / REPLACE / REMOVE PACEMAKER    . LEFT HEART CATHETERIZATION WITH CORONARY/GRAFT ANGIOGRAM N/A 12/10/2011   Procedure: LEFT HEART CATHETERIZATION WITH Beatrix Fetters;  Surgeon: Sanda Klein, MD;  Location: [place] CATH LAB;  Service:  Cardiovascular;  Laterality: N/A;  . LEFT HEART CATHETERIZATION WITH CORONARY/GRAFT ANGIOGRAM N/A 09/25/2013   Procedure: LEFT HEART CATHETERIZATION WITH Beatrix Fetters;  Surgeon: Blane Ohara, MD;  Location: [facility] CATH LAB;  Service: Cardiovascular;  Laterality: N/A;  . Persantine Myoview  05/06/2010   Post-rest ejection fraction 30%. No significant ischemia demonstrated. Compared to previous study there is no significant change.  . TRANSURETHRAL RESECTION OF PROSTATE     s/p     reports that he quit smoking about 33 years ago. He has never used smokeless tobacco. He reports that he does not drink alcohol or use drugs.  Allergies  Allergen Reactions  . Phenazopyridine Nausea Only and Other (See Comments)    GI UPSET  . Ramipril Other (See Comments)    unk    Family History  Problem Relation Age of Onset  . Hypertension Father      Prior to Admission medications   Medication Sig Start Date End Date Taking? Authorizing Provider  albuterol (VENTOLIN HFA) 108 (90 Base) MCG/ACT inhaler Inhale 2 puffs into the lungs 4 (four) times daily as needed. 08/02/18   Croitoru, Mihai, MD  apixaban (ELIQUIS) 5 MG TABS tablet Take 1 tablet (5 mg total) by mouth 2 (two) times daily. Please repeat blood work prior to next refill authorization. 07/06/18   Martinique, Peter M, MD  carvedilol (COREG) 12.5 MG tablet Take 12.5 mg by mouth daily.    [provider]  ferrous sulfate 325 (65 FE) MG tablet Take 1 tablet (325 mg total) by mouth daily for 30 days. 05/28/18   Elodia Florence., MD  furosemide (LASIX) 40 MG tablet Take 1 tablet (40 mg total) by mouth daily. 07/20/18 10/18/18  [name], MD  HYDROCORTISONE, TOPICAL, 2 % LOTN Apply 40 oz topically 2 (two) times daily. APPLY BID TO AFFECTED AREA 04/25/18   Lendon Colonel, NP  isosorbide mononitrate (IMDUR) 60 MG 24 hr tablet Take 1.5 tablets by mouth daily Patient taking differently: Take 90 mg by mouth daily. Take 1.5 tablets  by mouth daily 04/27/18   Croitoru, Mihai, MD  losartan (COZAAR) 50 MG tablet Take 1 tablet (50 mg total) by mouth daily. 07/20/18 10/18/18  [name], MD  pantoprazole (PROTONIX) 40 MG tablet Take 1 tablet (40 mg total) by mouth daily for 30 days. 05/29/18   Elodia Florence., MD  simvastatin (ZOCOR) 20 MG tablet Take 1 tablet (20 mg total) by mouth daily. 06/14/18   Lendon Colonel, NP  vitamin B-12 (CYANOCOBALAMIN) 500 MCG tablet Take 500 mcg by mouth daily.    [provider]    Physical Exam: Vitals:   08/11/18 2201 08/11/18 2230 08/11/18 2300 08/11/18 2345  BP: 120/70 116/60 120/68 117/67  Pulse: 64 (!) 59 (!) 59 (!) 58  Resp:  (!) 26 19 19   Temp:      TempSrc:      SpO2: 96% 94% 93% 94%    Constitutional: NAD, calm, comfortable Eyes: PERRL, lids and conjunctivae normal ENMT: Mucous membranes are moist. Posterior pharynx clear of any exudate or lesions.Normal  dentition.  Neck: normal, supple, no masses, no thyromegaly Respiratory: Rales present  Cardiovascular: Regular rate and rhythm, no murmurs / rubs / gallops. 3+ BLE pitting edema. 2+ pedal pulses. No carotid bruits.  Abdomen: no tenderness, no masses palpated. No hepatosplenomegaly. Bowel sounds positive.  Musculoskeletal: no clubbing / cyanosis. No joint deformity upper and lower extremities. Good ROM, no contractures. Normal muscle tone.  Skin: no rashes, lesions, ulcers. No induration Neurologic: CN 2-12 grossly intact. Sensation intact, DTR normal. Strength 5/5 in all 4.  Psychiatric: Normal judgment and insight. Alert and oriented x 3. Normal mood.    Labs on Admission: I have personally reviewed following labs and imaging studies  CBC: Recent Labs  Lab 08/11/18 2000  WBC 7.9  HGB 8.2*  HCT 27.8*  MCV 77.9*  PLT 240   Basic Metabolic Panel: Recent Labs  Lab 08/11/18 2000  NA 140  K 4.5  CL 109  CO2 26  GLUCOSE 121*  BUN 29*  CREATININE 1.26*  CALCIUM 8.4*   GFR: CrCl cannot be  calculated (Unknown ideal weight.). Liver Function Tests: No results for input(s): AST, ALT, ALKPHOS, BILITOT, PROT, ALBUMIN in the last 168 hours. No results for input(s): LIPASE, AMYLASE in the last 168 hours. No results for input(s): AMMONIA in the last 168 hours. Coagulation Profile: No results for input(s): INR, PROTIME in the last 168 hours. Cardiac Enzymes: Recent Labs  Lab 08/11/18 2246  TROPONINI 0.03*   BNP (last 3 results) No results for input(s): PROBNP in the last 8760 hours. HbA1C: No results for input(s): HGBA1C in the last 72 hours. CBG: No results for input(s): GLUCAP in the last 168 hours. Lipid Profile: No results for input(s): CHOL, HDL, LDLCALC, TRIG, CHOLHDL, LDLDIRECT in the last 72 hours. Thyroid Function Tests: No results for input(s): TSH, T4TOTAL, FREET4, T3FREE, THYROIDAB in the last 72 hours. Anemia Panel: No results for input(s): VITAMINB12, FOLATE, FERRITIN, TIBC, IRON, RETICCTPCT in the last 72 hours. Urine analysis:    Component Value Date/Time   COLORURINE YELLOW 08/11/2018 1954   APPEARANCEUR CLEAR 08/11/2018 1954   APPEARANCEUR Clear 12/13/2017 0918   LABSPEC 1.021 08/11/2018 1954   LABSPEC 1.021 11/19/2011 1501   PHURINE 5.0 08/11/2018 1954   GLUCOSEU NEGATIVE 08/11/2018 1954   GLUCOSEU Negative 11/19/2011 1501   HGBUR NEGATIVE 08/11/2018 [place] NEGATIVE 08/11/2018 1954   BILIRUBINUR Negative 12/13/2017 0918   BILIRUBINUR Negative 11/19/2011 1501   [place] 08/11/2018 [place] NEGATIVE 08/11/2018 1954   NITRITE NEGATIVE 08/11/2018 1954   LEUKOCYTESUR NEGATIVE 08/11/2018 1954   LEUKOCYTESUR Negative 11/19/2011 1501    Radiological Exams on Admission: Dg Ribs Unilateral W/chest Left  Result Date: 08/12/2018 CLINICAL DATA:  Recent falls with left-sided chest pain, initial encounter EXAM: LEFT RIBS AND CHEST - 3+ VIEW COMPARISON:  05/25/2018 FINDINGS: Cardiac shadow is enlarged. Defibrillator is again  seen as well as postoperative changes. Lungs are well aerated bilaterally. Stable small left pleural effusion is noted as well as left basilar atelectasis. No acute rib abnormality is noted. IMPRESSION: No acute rib abnormality is seen. Stable small left pleural effusion and left basilar atelectasis. Electronically Signed   By: Inez Catalina M.D.   On: 08/12/2018 00:59   Ct Head Wo Contrast  Result Date: 08/12/2018 CLINICAL DATA:  Recent falls with blood thinners EXAM: CT HEAD WITHOUT CONTRAST TECHNIQUE: Contiguous axial images were obtained from the base of the skull through the vertex without intravenous contrast. COMPARISON:  05/19/2018 FINDINGS: Brain:  Chronic atrophic and ischemic changes are noted. Lacunar infarcts are noted within the left thalamus better visualized than on the prior exam. No findings to suggest acute hemorrhage, acute infarction or space-occupying mass lesion are noted. Vascular: No hyperdense vessel or unexpected calcification. Skull: Normal. Negative for fracture or focal lesion. Sinuses/Orbits: No acute finding. Other: None. IMPRESSION: Chronic atrophic and ischemic changes. 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 No acute abnormality noted. Electronically Signed   By: Inez Catalina M.D.   On: 08/12/2018 02:06   Dg Humerus Left  Result Date: 08/12/2018 CLINICAL DATA:  Increasing falls over last 2 weeks with left arm pain, initial encounter EXAM: LEFT HUMERUS - 2+ VIEW COMPARISON:  None. FINDINGS: Degenerative changes of the acromioclavicular joint are noted. No acute fracture or dislocation is noted. No soft tissue abnormality is seen. IMPRESSION: No acute abnormality noted. Electronically Signed   By: Inez Catalina M.D.   On: 08/12/2018 00:57    EKG: Independently reviewed.  Assessment/Plan Principal Problem:   Acute on chronic combined systolic and diastolic CHF (congestive heart failure) (HCC) Active Problems:   Permanent atrial fibrillation [facility])    Biventricular ICD Medtronic Viva single chamber October 2014   Benign essential HTN   Status post coronary artery bypass grafting   Bilateral deafness   Anemia    1. Acute on chronic combined CHF - 1. CHF pathway 2. Lasix 40mg  IV BID 3. Strict intake and output 4. BMP daily 5. Tele monitor 6. Will hold off on repeat 2d echo for the moment 7. Serial trops just-in-case, though I think his CP / arm pain is secondary to fall. 2. HTN - 1. Continue home BP meds 3. A.Fib - 1. Continue Coreg once daily 2. Continue Eliquis 4. Anemia - 1. Stable anemia with HGB essentially unchanged since March 2. 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 3. Looks like he follows up with his GI doc on 6/19  DVT prophylaxis: Eliquis Code Status: Full Family Communication: Wife at bedside Disposition Plan: Home after admit Consults called: None Admission status: Place in [place]   , [place] Hospitalists  How to contact the [facility] Attending or Consulting provider [place] or covering provider during after hours [place], for this patient?  1. Check the care team in [facility] and look for a) attending/consulting [facility] provider listed and b) the [facility] team listed 2. Log into [email or web address]  Amion Physician Scheduling and messaging for groups and whole hospitals  On call and physician scheduling software for group practices, residents, hospitalists and other medical providers for call, clinic, rotation and shift schedules. OnCall [employer] is a hospital-wide system for scheduling doctors and paging doctors on call. EasyPlot is for scientific plotting and data analysis.  [email or web address]  and use [place]'s universal password to access. If you do not have the password, please contact the hospital operator.  3. Locate the [facility] provider you are looking for under [facility]  and page to a number that you can be directly reached. 4. If you still have difficulty reaching the provider, please page the [facility] (Director on Call) for the Hospitalists listed on amion for assistance.  08/12/2018, 4:10 AM

## 2018-08-12 NOTE — Progress Notes (Signed)
[facility] PROGRESS NOTE  Thomas [name] IOX:[id number] DOB: 07-30-1934 DOA: 08/11/2018 PCP: [name], MD  Assessment/Plan: 1. Acute on chronic ischemic CHF with combined systolic/diastolic exacerbation (TTE, 2/20: EF 35- 40%).  Reports poor adherence with lasix (denied on my exam, but admitted to cardiology consultants), denies any dietary indiscretion, and follows fluid restriction at home.  Reported weight gain (dry weight 231 per report), lower extremity edema on exam, found to have mildly elevated BNP of 316, small pleural effusion seems consistent with CHF flare.  Continue IV Lasix 40 mg twice daily as he is responding well with net -2 L in the past 24 hours, losartan, Coreg, cardiology recommends adding spironolactone 12.5 mg daily, closely monitor renal function daily weights, strict I's and O's, fluid restriction, low-salt diet 2. Reported falls x2.  Orthostatics negative.  Patient is a poor historian but thinks he lost his balance while standing denied any shocks, or prodromal symptoms cardiology plans to interrogate ICD. Pain control. XR imaging negative for fracture of left arm and ribs 3. Paroxysmal atrial fibrillation, ventricular paced rhythm on telemetry, continue Coreg,Continue apixaban 4. HTN, at goal.  Continue beta-blocker, closely monitor. 5. CAD status post CABG (remote history), stable.  Continue home Imdur, Zocor 6. CKD stage III stable at baseline (1.2-1.7), avoid nephrotoxins, monitor BMP, monitor output. 7. Hyperlipidemia/CAD, continue statin 8. Microcytic anemia secondary to iron deficient anemia, history of acute blood loss anemia.  Recent admission in March for symptomatic anemia related to erythematous to adenopathy.  Patient had plan outpatient evaluation by [facility] ( small bowel capsule endoscopy)of his iron deficiency anemia and reported melena (last seen in office by GI on 06/23/2018)Currentlydenies any melena or bright red blood per rectum, closely monitor  CBC  Code Status: FULL CODE Family Communication: none at bedside (indicate person spoken with, relationship, and if by phone, the number) Disposition Plan: IV Lasix, ICD interogation, PT recommends SNF given pain/poor mobility related to recent fall   Consultants:  Cardiology  Procedures:  none  Antibiotics:  none (indicate start date, and stop date if known)  HPI/Subjective:  Thomas [name] is a 84 y.o. year old male with medical history significant for CHF/ischemic cardiomyopathy status post biventricular ICD, paroxysmal atrial fibrillation on Eliquis, HTN, CKD stage III, CAD status post CABG in 1986, chronic anemia, deafness, and recent admission from 3/11-3/14 2020 symptomatic anemia secondary to erythematous throughout the base of EGDwho presented on 08/11/2018 with worsening lower leg swelling, falls x2 and dyspnea and was found to have acute on chronic combined systolic/diastolic CHF exacerbation.  Patient is signing with ASL interpreter Has no complaints No acute events overnight Reports that he has been taking his Lasix without fail  Objective: Vitals:   08/12/18 0833 08/12/18 1023  BP: 112/62 132/66  Pulse: 65 65  Resp: 17 16  Temp: 98.3 F (36.8 C) 98.2 F (36.8 C)  SpO2: 92% 94%    Intake/Output Summary (Last 24 hours) at 08/12/2018 1453 Last data filed at 08/12/2018 1333 Gross per 24 hour  Intake 500 ml  Output 2225 ml  Net -1725 ml   Filed Weights   08/12/18 0540  Weight: 112 kg    Exam:   General: Elderly male, lying in bed in no distress  Cardiovascular: Regular rate and rhythm, loud systolic murmur, loudest at apex, 2+ pitting edema bilateral lower extremities below knee  Respiratory: Normal respiratory effort on room air, minimal crackles at bases  Abdomen: Soft, nondistended, nontender, normal bowel sounds  Musculoskeletal: Limited  range of motion of left shoulder/arm to by pain  Skin bruising of left lateral arm  Neurologic alert  oriented x4, deaf, no appreciable focal deficits otherwise  Data Reviewed: Basic Metabolic Panel: Recent Labs  Lab 08/11/18 2000  NA 140  K 4.5  CL 109  CO2 26  GLUCOSE 121*  BUN 29*  CREATININE 1.26*  CALCIUM 8.4*   Liver Function Tests: No results for input(s): AST, ALT, ALKPHOS, BILITOT, PROT, ALBUMIN in the last 168 hours. No results for input(s): LIPASE, AMYLASE in the last 168 hours. No results for input(s): AMMONIA in the last 168 hours. CBC: Recent Labs  Lab 08/11/18 2000  WBC 7.9  HGB 8.2*  HCT 27.8*  MCV 77.9*  PLT 301   Cardiac Enzymes: Recent Labs  Lab 08/11/18 2246 08/12/18 0613 08/12/18 1200  TROPONINI 0.03* 0.03* 0.03*   BNP (last 3 results) Recent Labs    03/14/18 1736 05/25/18 1651 08/11/18 2246  BNP 375.8* 292.5* 316.9*    ProBNP (last 3 results) No results for input(s): PROBNP in the last 8760 hours.  CBG: No results for input(s): GLUCAP in the last 168 hours.  Recent Results (from the past 240 hour(s))  SARS Coronavirus 2 (CEPHEID - Performed in [place] hospital lab), Hosp Order     Status: None   Collection Time: 08/11/18 10:46 PM  Result Value Ref Range Status   SARS Coronavirus 2 NEGATIVE NEGATIVE Final    Comment: (NOTE) If result is NEGATIVE SARS-CoV-2 target nucleic acids are NOT DETECTED. The SARS-CoV-2 RNA is generally detectable in upper and lower  respiratory specimens during the acute phase of infection. The lowest  concentration of SARS-CoV-2 viral copies this assay can detect is 250  copies / mL. A negative result does not preclude SARS-CoV-2 infection  and should not be used as the sole basis for treatment or other  patient management decisions.  A negative result may occur with  improper specimen collection / handling, submission of specimen other  than nasopharyngeal swab, presence of viral mutation(s) within the  areas targeted by this assay, and inadequate number of viral copies  (<250 copies / mL). A  negative result must be combined with clinical  observations, patient history, and epidemiological information. If result is POSITIVE SARS-CoV-2 target nucleic acids are DETECTED. The SARS-CoV-2 RNA is generally detectable in upper and lower  respiratory specimens dur ing the acute phase of infection.  Positive  results are indicative of active infection with SARS-CoV-2.  Clinical  correlation with patient history and other diagnostic information is  necessary to determine patient infection status.  Positive results do  not rule out bacterial infection or co-infection with other viruses. If result is PRESUMPTIVE POSTIVE SARS-CoV-2 nucleic acids MAY BE PRESENT.   A presumptive positive result was obtained on the submitted specimen  and confirmed on repeat testing.  While 2019 novel coronavirus  (SARS-CoV-2) nucleic acids may be present in the submitted sample  additional confirmatory testing may be necessary for epidemiological  and / or clinical management purposes  to differentiate between  SARS-CoV-2 and other Sarbecovirus currently known to infect humans.  If clinically indicated additional testing with an alternate test  methodology [phone]) is advised. The SARS-CoV-2 RNA is generally  detectable in upper and lower respiratory sp ecimens during the acute  phase of infection. The expected result is Negative. Fact Sheet for Patients:  StrictlyIdeas.no Fact Sheet for Healthcare Providers: BankingDealers.co.za This test is not yet approved or cleared by the [place]  FDA and has been authorized for detection and/or diagnosis of SARS-CoV-2 by FDA under an Emergency Use Authorization (EUA).  This EUA will remain in effect (meaning this test can be used) for the duration of the COVID-19 declaration under Section 564(b)(1) of the Act, 21 U.S.C. section 360bbb-3(b)(1), unless the authorization is terminated or revoked sooner. Performed  at [facility], [place] [address], [place], [place] [address]      Studies: Dg Ribs Unilateral W/chest Left  Result Date: 08/12/2018 CLINICAL DATA:  Recent falls with left-sided chest pain, initial encounter EXAM: LEFT RIBS AND CHEST - 3+ VIEW COMPARISON:  05/25/2018 FINDINGS: Cardiac shadow is enlarged. Defibrillator is again seen as well as postoperative changes. Lungs are well aerated bilaterally. Stable small left pleural effusion is noted as well as left basilar atelectasis. No acute rib abnormality is noted. IMPRESSION: No acute rib abnormality is seen. Stable small left pleural effusion and left basilar atelectasis. Electronically Signed   By: Inez Catalina M.D.   On: 08/12/2018 00:59   Ct Head Wo Contrast  Result Date: 08/12/2018 CLINICAL DATA:  Recent falls with blood thinners EXAM: CT HEAD WITHOUT CONTRAST TECHNIQUE: Contiguous axial images were obtained from the base of the skull through the vertex without intravenous contrast. COMPARISON:  05/19/2018 FINDINGS: Brain: Chronic atrophic and ischemic changes are noted. Lacunar infarcts are noted within the left thalamus better visualized than on the prior exam. No findings to suggest acute hemorrhage, acute infarction or space-occupying mass lesion are noted. Vascular: No hyperdense vessel or unexpected calcification. Skull: Normal. Negative for fracture or focal lesion. Sinuses/Orbits: No acute finding. Other: None. IMPRESSION: Chronic atrophic and ischemic changes. 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 No acute abnormality noted. Electronically Signed   By: Inez Catalina M.D.   On: 08/12/2018 02:06   Dg Humerus Left  Result Date: 08/12/2018 CLINICAL DATA:  Increasing falls over last 2 weeks with left arm pain, initial encounter EXAM: LEFT HUMERUS - 2+ VIEW COMPARISON:  None. FINDINGS: Degenerative changes of the acromioclavicular joint are noted. No acute fracture or dislocation is noted. No soft tissue abnormality is seen.  IMPRESSION: No acute abnormality noted. Electronically Signed   By: Inez Catalina M.D.   On: 08/12/2018 00:57    Scheduled Meds: . apixaban  5 mg Oral BID  . carvedilol  12.5 mg Oral Q breakfast  . ferrous sulfate  325 mg Oral Daily  . furosemide  40 mg Intravenous BID  . isosorbide mononitrate  90 mg Oral Daily  . losartan  50 mg Oral Daily  . pantoprazole  40 mg Oral Daily  . simvastatin  20 mg Oral q1800  . sodium chloride flush  3 mL Intravenous Q12H  . spironolactone  12.5 mg Oral Daily  . vitamin B-12  500 mcg Oral Daily   Continuous Infusions: . sodium chloride      Principal Problem:   Acute on chronic combined systolic and diastolic CHF (congestive heart failure) (HCC) Active Problems:   Permanent atrial fibrillation [facility])   Biventricular ICD Medtronic Viva single chamber October 2014   Benign essential HTN   Status post coronary artery bypass grafting   Bilateral deafness   Anemia      Desiree Hane  [facility]

## 2018-08-12 NOTE — Plan of Care (Signed)
Problem: Education: Goal: Ability to demonstrate management of disease process will improve Outcome: Progressing Goal: Ability to verbalize understanding of medication therapies will improve Outcome: Progressing Goal: Individualized Educational Video(s) Outcome: Progressing   Problem: Activity: Goal: Capacity to carry out activities will improve Outcome: Progressing   Problem: Cardiac: Goal: Ability to achieve and maintain adequate cardiopulmonary perfusion will improve Outcome: Progressing   Problem: Education: Goal: Knowledge of General Education information will improve Description Including pain rating scale, medication(s)/side effects and non-pharmacologic comfort measures Outcome: Progressing   Problem: Health Behavior/Discharge Planning: Goal: Ability to manage health-related needs will improve Outcome: Progressing   Problem: Clinical Measurements: Goal: Ability to maintain clinical measurements within normal limits will improve Outcome: Progressing Goal: Will remain free from infection Outcome: Progressing Goal: Diagnostic test results will improve Outcome: Progressing Goal: Respiratory complications will improve Outcome: Progressing Goal: Cardiovascular complication will be avoided Outcome: Progressing   Problem: Activity: Goal: Risk for activity intolerance will decrease Outcome: Progressing   Problem: Nutrition: Goal: Adequate nutrition will be maintained Outcome: Progressing   Problem: Coping: Goal: Level of anxiety will decrease Outcome: Progressing   Problem: Elimination: Goal: Will not experience complications related to bowel motility Outcome: Progressing Goal: Will not experience complications related to urinary retention Outcome: Progressing   Problem: Pain Managment: Goal: General experience of comfort will improve Outcome: Progressing   Problem: Safety: Goal: Ability to remain free from injury will improve Outcome: Progressing    Problem: Skin Integrity: Goal: Risk for impaired skin integrity will decrease Outcome: Progressing   Problem: Consults Goal: RH GENERAL PATIENT EDUCATION Description See Patient Education module for education specifics. Outcome: Progressing Goal: Skin Care Protocol Initiated - if Braden Score 18 or less Description If consults are not indicated, leave blank or document N/A Outcome: Progressing Goal: Nutrition Consult-if indicated Outcome: Progressing Goal: Diabetes Guidelines if Diabetic/Glucose > 140 Description If diabetic or lab glucose is > 140 mg/dl - Initiate Diabetes/Hyperglycemia Guidelines & Document Interventions  Outcome: Progressing   Problem: RH BOWEL ELIMINATION Goal: RH STG MANAGE BOWEL WITH ASSISTANCE Description STG Manage Bowel with Assistance. Outcome: Progressing Goal: RH STG MANAGE BOWEL W/MEDICATION W/ASSISTANCE Description STG Manage Bowel with Medication with Assistance. Outcome: Progressing   Problem: RH BLADDER ELIMINATION Goal: RH STG MANAGE BLADDER WITH ASSISTANCE Description STG Manage Bladder With Assistance Outcome: Progressing Goal: RH STG MANAGE BLADDER WITH MEDICATION WITH ASSISTANCE Description STG Manage Bladder With Medication With Assistance. Outcome: Progressing Goal: RH STG MANAGE BLADDER WITH EQUIPMENT WITH ASSISTANCE Description STG Manage Bladder With Equipment With Assistance Outcome: Progressing   Problem: RH SKIN INTEGRITY Goal: RH STG SKIN FREE OF INFECTION/BREAKDOWN Outcome: Progressing Goal: RH STG MAINTAIN SKIN INTEGRITY WITH ASSISTANCE Description STG Maintain Skin Integrity With Assistance. Outcome: Progressing Goal: RH STG ABLE TO PERFORM INCISION/WOUND CARE W/ASSISTANCE Description STG Able To Perform Incision/Wound Care With Assistance. Outcome: Progressing   Problem: RH SAFETY Goal: RH STG ADHERE TO SAFETY PRECAUTIONS W/ASSISTANCE/DEVICE Description STG Adhere to Safety Precautions With  Assistance/Device. Outcome: Progressing Goal: RH STG DECREASED RISK OF FALL WITH ASSISTANCE Description STG Decreased Risk of Fall With Assistance. Outcome: Progressing   Problem: RH PAIN MANAGEMENT Goal: RH STG PAIN MANAGED AT OR BELOW PT'S PAIN GOAL Outcome: Progressing   Problem: RH KNOWLEDGE DEFICIT GENERAL Goal: RH STG INCREASE KNOWLEDGE OF SELF CARE AFTER HOSPITALIZATION Outcome: Progressing   Problem: RH Vision Goal: RH LTG Vision (Specify) Outcome: Progressing   Problem: RH Pre-functional/Other (Specify) Goal: RH LTG Pre-functional (Specify) Outcome: Progressing Goal: RH LTG Interdisciplinary (Specify) 1 Description  RH LTG Interdisciplinary (Specify)1 Outcome: Progressing Goal: RH LTG Interdisciplinary (Specify) 2 Description RH LTG Interdisciplinary (Specify) 2  Outcome: Progressing

## 2018-08-12 NOTE — Progress Notes (Signed)
Wife has left she came in for couple of hours today bring in patient's meds writer alerted pharmacy list of meds placed in patient's chart(home meds). Dr.Netty did speak with wife through interpreter today updated her on status of patient and treatment plan she reported if he continued to do well may discharge him home tomarrow. Wife informed Probation officer would be back Optician, dispensing informed. He did have pacer interogated the tech reported no problems with pacer. Writer spoke with [place] for Dr.Crenshaw they reported plan to come up and talk with patient was to set up time with interpreter. No further changes noted this shift.

## 2018-08-13 DIAGNOSIS — W19XXXA Unspecified fall, initial encounter: Secondary | ICD-10-CM

## 2018-08-13 DIAGNOSIS — H9193 Unspecified hearing loss, bilateral: Secondary | ICD-10-CM

## 2018-08-13 LAB — CBC
HCT: 24.4 % — ABNORMAL LOW (ref 39.0–52.0)
Hemoglobin: 7.5 g/dL — ABNORMAL LOW (ref 13.0–17.0)
MCH: 23.4 pg — ABNORMAL LOW (ref 26.0–34.0)
MCHC: 30.7 g/dL (ref 30.0–36.0)
MCV: 76.3 fL — ABNORMAL LOW (ref 80.0–100.0)
Platelets: 232 10*3/uL (ref 150–400)
RBC: 3.2 MIL/uL — ABNORMAL LOW (ref 4.22–5.81)
RDW: 19.5 % — ABNORMAL HIGH (ref 11.5–15.5)
WBC: 7.1 10*3/uL (ref 4.0–10.5)
nRBC: 0 % (ref 0.0–0.2)

## 2018-08-13 LAB — BASIC METABOLIC PANEL
Anion gap: 8 (ref 5–15)
BUN: 24 mg/dL — ABNORMAL HIGH (ref 8–23)
CO2: 26 mmol/L (ref 22–32)
Calcium: 8.1 mg/dL — ABNORMAL LOW (ref 8.9–10.3)
Chloride: 104 mmol/L (ref 98–111)
Creatinine, Ser: 1.43 mg/dL — ABNORMAL HIGH (ref 0.61–1.24)
GFR calc Af Amer: 53 mL/min — ABNORMAL LOW (ref >60)
GFR calc non Af Amer: 46 mL/min — ABNORMAL LOW (ref >60)
Glucose, Bld: 97 mg/dL (ref 70–99)
Potassium: 4.1 mmol/L (ref 3.5–5.1)
Sodium: 138 mmol/L (ref 135–145)

## 2018-08-13 LAB — PREPARE RBC (CROSSMATCH)

## 2018-08-13 MED ORDER — FUROSEMIDE 10 MG/ML IJ SOLN
20.0000 mg | Freq: Once | INTRAMUSCULAR | Status: AC
  Administered 2018-08-14: 20 mg via INTRAVENOUS
  Filled 2018-08-13: qty 2

## 2018-08-13 MED ORDER — SODIUM CHLORIDE 0.9% IV SOLUTION: Freq: Once | INTRAVENOUS | Status: DC

## 2018-08-13 MED ORDER — POLYETHYLENE GLYCOL 3350 17 G PO PACK
17.0000 g | PACK | Freq: Every day | ORAL | Status: DC
  Administered 2018-08-13 – 2018-08-14 (×2): 17 g via ORAL
  Filled 2018-08-13 (×2): qty 1

## 2018-08-13 MED ORDER — CYCLOBENZAPRINE HCL 5 MG PO TABS
5.0000 mg | ORAL_TABLET | Freq: Three times a day (TID) | ORAL | Status: DC | PRN
  Administered 2018-08-14 – 2018-08-16 (×2): 5 mg via ORAL
  Filled 2018-08-13 (×2): qty 1

## 2018-08-13 MED ORDER — SENNOSIDES-DOCUSATE SODIUM 8.6-50 MG PO TABS
1.0000 | ORAL_TABLET | Freq: Two times a day (BID) | ORAL | Status: DC
  Administered 2018-08-13 – 2018-08-14 (×3): 1 via ORAL
  Filled 2018-08-13 (×3): qty 1

## 2018-08-13 NOTE — Progress Notes (Signed)
Nutrition Brief Note  RD working remotely.  RD consulted for assessment of nutritional status/needs.   Wt Readings from Last 15 Encounters:  08/13/18 103.4 kg  07/20/18 107 kg  06/16/18 106.1 kg  05/28/18 100.1 kg  05/25/18 106.1 kg  05/19/18 81.6 kg  04/25/18 109.8 kg  04/11/18 105.9 kg  03/28/18 103.1 kg  03/14/18 104.7 kg  02/28/18 107.2 kg  02/01/18 111.1 kg  12/30/17 108.4 kg  12/13/17 108.8 kg  10/12/17 108.9 kg   Thomas [name] is a 84 y.o. male with medical history significant of CHF dt ICM with EF 35-40% on most recent echo in Feb this year, he is s/p 3x CABG in 1995 and AICD / Biventricular PPM placement.  He also has A.Fib on eliquis and HTN.  Pt admitted with CHF.   Pt requires sign language interpreter for communication. Since RD is working remotely today, unable to speak with pt to obtain further history. Per cardiology notes, pt reports indiscretion with lasix.   Reviewed I/O's: -2.4 L x 24 hours and -3.4 L since admission  UOP: 3.3 L since admission  Reviewed wt hx; wt ranges between 220-233#. Suspect wt fluctuations related to diuretic use.   RD will provide written material for pt at discharge. Provided "Low Sodium Nutrition Therapy", "Sodium Free Flavoring Tips", and "Fluid Restricted Nutrition Therapy" handouts from [facility] Nutrition Care Manual. Text pasted to AVS/discharge instructions.   Labs reviewed.   Body mass index is 29.27 kg/m. Patient meets criteria for overweight based on current BMI.   Current diet order is 2 grams sodium with 1.5 L fluid restriction, patient is consuming approximately 75-100% of meals at this time. Labs and medications reviewed.   No nutrition interventions warranted at this time. If nutrition issues arise, please consult RD.   My Rinke A. [name], RD, LDN, [place] Registered Dietitian II Certified Diabetes Care and Education Specialist Pager: [phone] After hours Pager: [phone]

## 2018-08-13 NOTE — Progress Notes (Signed)
[facility] PROGRESS NOTE  Thomas [name]:[id number] DOB: 1934-11-02 DOA: 08/11/2018 PCP: [name], MD  Assessment/Plan: 1. Acute on chronic ischemic CHF with combined systolic/diastolic exacerbation (TTE, 2/20: EF 35- 40%).  Reports poor adherence with lasix (denied on my exam, but admitted to cardiology consultants), reported weight gain (dry weight 231 per report), lower extremity edema on exam, found to have mildly elevated BNP of 316, small pleural effusion seems consistent with CHF flare.  Continue IV Lasix 40 mg twice daily as he is responding well with net -2 L in the past 24 hours and down 3 L since admission but still with peripheral edema, losartan, Coreg, cardiology recommended adding spironolactone 12.5 mg daily, closely monitor renal function daily weights, strict I's and O's, fluid restriction, low-salt diet 2. Reported falls x2.  Orthostatics negative.  Patient is a poor historian but thinks he lost his balance while standing denied any shocks, or prodromal symptoms cardiology interrogated ICD. Pain control with flexeril PRN. XR imaging negative for fracture of left arm and ribs 3. Paroxysmal atrial fibrillation, ventricular paced rhythm on telemetry, continue Coreg,Continue apixaban 4. HTN, at goal.  Continue beta-blocker, closely monitor. 5. CAD status post CABG (remote history), stable.  Continue home Imdur, Zocor 6. CKD stage III stable at baseline (1.2-1.7), avoid nephrotoxins, monitor BMP, monitor output. 7. Hyperlipidemia/CAD, continue statin 8. Microcytic anemia secondary to iron deficient anemia, history of acute blood loss anemia.  Recent admission in March for symptomatic anemia related to erythematous to adenopathy.  Patient had plan outpatient evaluation by [facility] ( small bowel capsule endoscopy)of his iron deficiency anemia and reported melena (last seen in office by GI on 06/23/2018)Currentlydenies any melena or bright red blood per rectum, closely monitor  CBC, transfuse 1 U given goal hemoglobin > 8 ( cardiac disease) and give lasix 20 mg after transfusion  Code Status: FULL CODE Family Communication: none at bedside (indicate person spoken with, relationship, and if by phone, the number) Disposition Plan: IV Lasix, blood transfusion PT recommends SNF given pain/poor mobility related to recent fall   Consultants:  Cardiology  Procedures:  none  Antibiotics:  none (indicate start date, and stop date if known)  HPI/Subjective:  Thomas [name] is a 84 y.o. year old male with medical history significant for CHF/ischemic cardiomyopathy status post biventricular ICD, paroxysmal atrial fibrillation on Eliquis, HTN, CKD stage III, CAD status post CABG in 1986, chronic anemia, deafness, and recent admission from 3/11-3/14 2020 symptomatic anemia secondary to erythematous throughout the base of EGDwho presented on 08/11/2018 with worsening lower leg swelling, falls x2 and dyspnea and was found to have acute on chronic combined systolic/diastolic CHF exacerbation.  Patient is signing with ASL interpreter at bedside Still having left shoulder pain Wife at bedside  Objective: Vitals:   08/13/18 1136 08/13/18 1930  BP: (!) 111/59 118/62  Pulse: (!) 59 60  Resp: 20   Temp: 98.3 F (36.8 C)   SpO2: 92% 91%    Intake/Output Summary (Last 24 hours) at 08/13/2018 2112 Last data filed at 08/13/2018 1718 Gross per 24 hour  Intake 1377 ml  Output 2450 ml  Net -1073 ml   Filed Weights   08/12/18 0540 08/13/18 0627  Weight: 112 kg 103.4 kg    Exam:   General: Elderly male, lying in bed in no distress  Cardiovascular: Regular rate and rhythm, loud systolic murmur, loudest at apex, 2+ pitting edema bilateral lower extremities below knee  Respiratory: difficult to appreciate as unable to  lean forward with pain from shoulder/ribcage area  Abdomen: Soft, nondistended, nontender, normal bowel sounds  Musculoskeletal: Limited range of motion  of left shoulder/arm due to pain  Skin bruising of left lateral arm  Neurologic alert oriented x4, deaf, no appreciable focal deficits otherwise  Data Reviewed: Basic Metabolic Panel: Recent Labs  Lab 08/11/18 2000 08/13/18 0502  NA 140 138  K 4.5 4.1  CL 109 104  CO2 26 26  GLUCOSE 121* 97  BUN 29* 24*  CREATININE 1.26* 1.43*  CALCIUM 8.4* 8.1*   Liver Function Tests: No results for input(s): AST, ALT, ALKPHOS, BILITOT, PROT, ALBUMIN in the last 168 hours. No results for input(s): LIPASE, AMYLASE in the last 168 hours. No results for input(s): AMMONIA in the last 168 hours. CBC: Recent Labs  Lab 08/11/18 2000 08/13/18 0502  WBC 7.9 7.1  HGB 8.2* 7.5*  HCT 27.8* 24.4*  MCV 77.9* 76.3*  PLT 301 232   Cardiac Enzymes: Recent Labs  Lab 08/11/18 2246 08/12/18 0613 08/12/18 1200 08/12/18 1752  TROPONINI 0.03* 0.03* 0.03* 0.03*   BNP (last 3 results) Recent Labs    03/14/18 1736 05/25/18 1651 08/11/18 2246  BNP 375.8* 292.5* 316.9*    ProBNP (last 3 results) No results for input(s): PROBNP in the last 8760 hours.  CBG: No results for input(s): GLUCAP in the last 168 hours.  Recent Results (from the past 240 hour(s))  SARS Coronavirus 2 (CEPHEID - Performed in [facility] hospital lab), Hosp Order     Status: None   Collection Time: 08/11/18 10:46 PM  Result Value Ref Range Status   SARS Coronavirus 2 NEGATIVE NEGATIVE Final    Comment: (NOTE) If result is NEGATIVE SARS-CoV-2 target nucleic acids are NOT DETECTED. The SARS-CoV-2 RNA is generally detectable in upper and lower  respiratory specimens during the acute phase of infection. The lowest  concentration of SARS-CoV-2 viral copies this assay can detect is 250  copies / mL. A negative result does not preclude SARS-CoV-2 infection  and should not be used as the sole basis for treatment or other  patient management decisions.  A negative result may occur with  improper specimen collection /  handling, submission of specimen other  than nasopharyngeal swab, presence of viral mutation(s) within the  areas targeted by this assay, and inadequate number of viral copies  (<250 copies / mL). A negative result must be combined with clinical  observations, patient history, and epidemiological information. If result is POSITIVE SARS-CoV-2 target nucleic acids are DETECTED. The SARS-CoV-2 RNA is generally detectable in upper and lower  respiratory specimens dur ing the acute phase of infection.  Positive  results are indicative of active infection with SARS-CoV-2.  Clinical  correlation with patient history and other diagnostic information is  necessary to determine patient infection status.  Positive results do  not rule out bacterial infection or co-infection with other viruses. If result is PRESUMPTIVE POSTIVE SARS-CoV-2 nucleic acids MAY BE PRESENT.   A presumptive positive result was obtained on the submitted specimen  and confirmed on repeat testing.  While 2019 novel coronavirus  (SARS-CoV-2) nucleic acids may be present in the submitted sample  additional confirmatory testing may be necessary for epidemiological  and / or clinical management purposes  to differentiate between  SARS-CoV-2 and other Sarbecovirus currently known to infect humans.  If clinically indicated additional testing with an alternate test  methodology [phone]) is advised. The SARS-CoV-2 RNA is generally  detectable in upper and lower respiratory  sp ecimens during the acute  phase of infection. The expected result is Negative. Fact Sheet for Patients:  StrictlyIdeas.no Fact Sheet for Healthcare Providers: BankingDealers.co.za This test is not yet approved or cleared by the [place] FDA and has been authorized for detection and/or diagnosis of SARS-CoV-2 by FDA under an Emergency Use Authorization (EUA).  This EUA will remain in effect (meaning this  test can be used) for the duration of the COVID-19 declaration under Section 564(b)(1) of the Act, 21 U.S.C. section 360bbb-3(b)(1), unless the authorization is terminated or revoked sooner. Performed at [facility], [place] [address], [place], [place] [address]      Studies: Dg Ribs Unilateral W/chest Left  Result Date: 08/12/2018 CLINICAL DATA:  Recent falls with left-sided chest pain, initial encounter EXAM: LEFT RIBS AND CHEST - 3+ VIEW COMPARISON:  05/25/2018 FINDINGS: Cardiac shadow is enlarged. Defibrillator is again seen as well as postoperative changes. Lungs are well aerated bilaterally. Stable small left pleural effusion is noted as well as left basilar atelectasis. No acute rib abnormality is noted. IMPRESSION: No acute rib abnormality is seen. Stable small left pleural effusion and left basilar atelectasis. Electronically Signed   By: Inez Catalina M.D.   On: 08/12/2018 00:59   Ct Head Wo Contrast  Result Date: 08/12/2018 CLINICAL DATA:  Recent falls with blood thinners EXAM: CT HEAD WITHOUT CONTRAST TECHNIQUE: Contiguous axial images were obtained from the base of the skull through the vertex without intravenous contrast. COMPARISON:  05/19/2018 FINDINGS: Brain: Chronic atrophic and ischemic changes are noted. Lacunar infarcts are noted within the left thalamus better visualized than on the prior exam. No findings to suggest acute hemorrhage, acute infarction or space-occupying mass lesion are noted. Vascular: No hyperdense vessel or unexpected calcification. Skull: Normal. Negative for fracture or focal lesion. Sinuses/Orbits: No acute finding. Other: None. IMPRESSION: Chronic atrophic and ischemic changes. 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 No acute abnormality noted. Electronically Signed   By: Inez Catalina M.D.   On: 08/12/2018 02:06   Dg Humerus Left  Result Date: 08/12/2018 CLINICAL DATA:  Increasing falls over last 2 weeks with left arm pain, initial  encounter EXAM: LEFT HUMERUS - 2+ VIEW COMPARISON:  None. FINDINGS: Degenerative changes of the acromioclavicular joint are noted. No acute fracture or dislocation is noted. No soft tissue abnormality is seen. IMPRESSION: No acute abnormality noted. Electronically Signed   By: Inez Catalina M.D.   On: 08/12/2018 00:57    Scheduled Meds: . sodium chloride   Intravenous Once  . apixaban  5 mg Oral BID  . carvedilol  12.5 mg Oral Q breakfast  . ferrous sulfate  325 mg Oral Daily  . furosemide  20 mg Intravenous Once  . furosemide  40 mg Intravenous BID  . isosorbide mononitrate  90 mg Oral Daily  . losartan  50 mg Oral Daily  . pantoprazole  40 mg Oral Daily  . polyethylene glycol  17 g Oral Daily  . senna-docusate  1 tablet Oral BID  . simvastatin  20 mg Oral q1800  . sodium chloride flush  3 mL Intravenous Q12H  . spironolactone  12.5 mg Oral Daily  . vitamin B-12  500 mcg Oral Daily   Continuous Infusions: . sodium chloride      Principal Problem:   Acute on chronic combined systolic and diastolic CHF (congestive heart failure) (HCC) Active Problems:   Permanent atrial fibrillation [facility])   Biventricular ICD [employer] Viva single chamber October  2014   Benign essential HTN   Status post coronary artery bypass grafting   Bilateral deafness   Anemia   Acute on chronic systolic CHF (congestive heart failure) ([place])      Desiree Hane  [facility]

## 2018-08-13 NOTE — Progress Notes (Signed)
Called MD- interpreter in room.   MD able to come while present.

## 2018-08-13 NOTE — Progress Notes (Signed)
Blood bank needed further explanation for giving blood with Hgb greater than 7. MD on call notified. Will re-evaluate in AM.

## 2018-08-13 NOTE — Discharge Instructions (Signed)
Low Sodium Nutrition Therapy  °Eating less sodium can help you if you have high blood pressure, heart failure, or kidney or liver disease.  ° °Your body needs a little sodium, but too much sodium can cause your body to hold onto extra water. This extra water will raise your blood pressure and can cause damage to your heart, kidneys, or liver as they are forced to work harder.  ° °Sometimes you can see how the extra fluid affects you because your hands, legs, or belly swell. You may also hold water around your heart and lungs, which makes it hard to breathe.  ° °Even if you take medication for blood pressure or a water pill (diuretic) to remove fluid, it is still important to have less salt in your diet.  ° °Check with your primary care provider before drinking alcohol since it may affect the amount of fluid in your body and how your heart, kidneys, or liver work. °Sodium in Food °A low-sodium meal plan limits the sodium that you get from food and beverages to 1,500-2,000 milligrams (mg) per day. Salt is the main source of sodium. Read the nutrition label on the package to find out how much sodium is in one serving of a food.  °• Select foods with 140 milligrams (mg) of sodium or less per serving.  °• You may be able to eat one or two servings of foods with a little more than 140 milligrams (mg) of sodium if you are closely watching how much sodium you eat in a day.  °• Check the serving size on the label. The amount of sodium listed on the label shows the amount in one serving of the food. So, if you eat more than one serving, you will get more sodium than the amount listed. ° °Tips °Cutting Back on Sodium °• Eat more fresh foods.  °• Fresh fruits and vegetables are low in sodium, as well as frozen vegetables and fruits that have no added juices or sauces.  °• Fresh meats are lower in sodium than processed meats, such as bacon, sausage, and hotdogs.  °• Not all processed foods are unhealthy, but some processed foods  may have too much sodium.  °• Eat less salt at the table and when cooking. One of the ingredients in salt is sodium.  °• One teaspoon of table salt has 2,300 milligrams of sodium.  °• Leave the salt out of recipes for pasta, casseroles, and soups. °• Be a smart shopper.  °• Food packages that say “Salt-free”, sodium-free”, “very low sodium,” and “low sodium” have less than 140 milligrams of sodium per serving.  °• Beware of products identified as “Unsalted,” “No Salt Added,” “Reduced Sodium,” or “Lower Sodium.” These items may still be high in sodium. You should always check the nutrition label. °• Add flavors to your food without adding sodium.  °• Try lemon juice, lime juice, or vinegar.  °• Dry or fresh herbs add flavor.  °• Buy a sodium-free seasoning blend or make your own at home. °• You can purchase salt-free or sodium-free condiments like barbeque sauce in stores and online. Ask your registered dietitian nutritionist for recommendations and where to find them.  °•  °Eating in Restaurants °• Choose foods carefully when you eat outside your home. Restaurant foods can be very high in sodium. Many restaurants provide nutrition facts on their menus or their websites. If you cannot find that information, ask your server. Let your server know that you want your food   to be cooked without salt and that you would like your salad dressing and sauces to be served on the side.  °•  ° °• Foods Recommended °• Food Group • Foods Recommended  °• Grains • Bread, bagels, rolls without salted tops °Homemade bread made with reduced-sodium baking powder °Cold cereals, especially shredded wheat and puffed rice °Oats, grits, or cream of wheat °Pastas, quinoa, and rice °Popcorn, pretzels or crackers without salt °Corn tortillas  °• Protein Foods • Fresh meats and fish; turkey bacon (check the nutrition labels - make sure they are not packaged in a sodium solution) °Canned or packed tuna (no more than 4 ounces at 1 serving) °Beans  and peas °Soybeans) and tofu °Eggs °Nuts or nut butters without salt  °• Dairy • Milk or milk powder °Plant milks, such as rice and soy °Yogurt, including Greek yogurt °Small amounts of natural cheese (blocks of cheese) or reduced-sodium cheese can be used in moderation. (Swiss, ricotta, and fresh mozzarella cheese are lower in sodium than the others) °Cream Cheese °Low sodium cottage cheese  °• Vegetables • Fresh and frozen vegetables without added sauces or salt °Homemade soups (without salt) °Low-sodium, salt-free or sodium-free canned vegetables and soups  °• Fruit • Fresh and canned fruits °Dried fruits, such as raisins, cranberries, and prunes  °• Oils • Tub or liquid margarine, regular or without salt °Canola, corn, peanut, olive, safflower, or sunflower oils  °• Condiments • Fresh or dried herbs such as basil, bay leaf, dill, mustard (dry), nutmeg, paprika, parsley, rosemary, sage, or thyme.  °Low sodium ketchup °Vinegar  °Lemon or lime juice °Pepper, red pepper flakes, and cayenne. °Hot sauce contains sodium, but if you use just a drop or two, it will not add up to much.  °Salt-free or sodium-free seasoning mixes and marinades °Simple salad dressings: vinegar and oil  °•  °• Foods Not Recommended °• Food Group • Foods Not Recommended  °• Grains • Breads or crackers topped with salt °Cereals (hot/cold) with more than 300 mg sodium per serving °Biscuits, cornbread, and other “quick” breads prepared with baking soda °Pre-packaged bread crumbs °Seasoned and packaged rice and pasta mixes °Self-rising flours  °• Protein Foods • Cured meats: Bacon, ham, sausage, pepperoni and hot dogs °Canned meats (chili, vienna sausage, or sardines) °Smoked fish and meats °Frozen meals that have more than 600 mg of sodium per serving °Egg substitute (with added sodium)  °• Dairy • Buttermilk °Processed cheese spreads °Cottage cheese (1 cup may have over 500 mg of sodium; look for low-sodium.) °American or feta cheese °Shredded  Cheese has more sodium than blocks of cheese °String cheese  °• Vegetables • Canned vegetables (unless they are salt-free, sodium-free or low sodium) °Frozen vegetables with seasoning and sauces °Sauerkraut and pickled vegetables °Canned or dried soups (unless they are salt-free, sodium-free, or low sodium) °French fries and onion rings  °• Fruit • Dried fruits preserved with additives that have sodium  °• Oils • Salted butter or margarine, all types of olives  °• Condiments • Salt, sea salt, kosher salt, onion salt, and garlic salt °Seasoning mixes with salt °Bouillon cubes °Ketchup °Barbeque sauce and Worcestershire sauce unless low sodium °Soy sauce °Salsa, pickles, olives, relish °Salad dressings: ranch, blue cheese, Italian, and French.  °•  °• Low Sodium Sample 1-Day Menu  °• Breakfast • 1 cup cooked oatmeal  °• 1 slice whole wheat bread toast  °• 1 tablespoon peanut butter without salt  °• 1 banana  °•   1 cup 1% milk  °• Lunch • Tacos made with: 2 corn tortillas  °• ¼ cup black beans, low sodium  °• ½ cup roasted or grilled chicken (without skin)  °• ¼ avocado  °• Squeeze of lime juice  °• 1 cup salad greens  °• 1 tablespoon low-sodium salad dressing  °• ¼ cup strawberries  °• 1 orange  °• Afternoon Snack • 1/3 cup grapes  °• 6 ounces yogurt  °• Evening Meal • 3 ounces herb-baked fish  °• 1 baked potato  °• 2 teaspoons olive oil  °• ½ cup cooked carrots  °• 2 thick slices tomatoes on:  °• 2 lettuce leaves  °• 1 teaspoon olive oil  °• 1 teaspoon balsamic vinegar  °• 1 cup 1% milk  °• Evening Snack • 1 apple  °• ¼ cup almonds without salt  °•  °• Low-Sodium Vegetarian (Lacto-Ovo) Sample 1-Day Menu  °• Breakfast • 1 cup cooked oatmeal  °• 1 slice whole wheat toast  °• 1 tablespoon peanut butter without salt  °• 1 banana  °• 1 cup 1% milk  °• Lunch • Tacos made with: 2 corn tortillas  °• ¼ cup black beans, low sodium  °• ½ cup roasted or grilled chicken (without skin)  °• ¼ avocado  °• Squeeze of lime juice  °• 1  cup salad greens  °• 1 tablespoon low-sodium salad dressing  °• ¼ cup strawberries  °• 1 orange  °• Evening Meal • Stir fry made with: ½ cup tofu  °• 1 cup brown rice  °• ½ cup broccoli  °• ½ cup green beans  °• ½ cup peppers  °• ½ tablespoon peanut oil  °• 1 orange  °• 1 cup 1% milk  °• Evening Snack • 4 strips celery  °• 2 tablespoons hummus  °• 1 hard-boiled egg  °•  °• Low-Sodium Vegan Sample 1-Day Menu  °• Breakfast • 1 cup cooked oatmeal  °• 1 tablespoon peanut butter without salt  °• 1 cup blueberries  °• 1 cup soymilk fortified with calcium, vitamin B12, and vitamin D  °• Lunch • 1 small whole wheat pita  °• ½ cup cooked lentils  °• 2 tablespoons hummus  °• 4 carrot sticks  °• 1 medium apple  °• 1 cup soymilk fortified with calcium, vitamin B12, and vitamin D  °• Evening Meal • Stir fry made with: ½ cup tofu  °• 1 cup brown rice  °• ½ cup broccoli  °• ½ cup green beans  °• ½ cup peppers  °• ½ tablespoon peanut oil  °• 1 cup cantaloupe  °• Evening Snack • 1 cup soy yogurt  °• ¼ cup mixed nuts  °• Copyright 2020 © [employer]. All rights reserved °•  °• Sodium Free Flavoring Tips °•  °• When cooking, the following items may be used for flavoring instead of salt or seasonings that contain sodium. °• Remember: A little bit of spice goes a long way! Be careful not to overseason. °• Spice Blend Recipe (makes about ? cup) °• 5 teaspoons onion powder  °• 2½ teaspoons garlic powder  °• 2½ teaspoons paprika  °• 2½ teaspoon dry mustard  °• 1½ teaspoon crushed thyme leaves  °• ½ teaspoon white pepper  °• ¼ teaspoon celery seed °Food Item Flavorings  °Beef Basil, bay leaf, caraway, curry, dill, dry mustard, garlic, grape jelly, green pepper, mace, marjoram, mushrooms (fresh), nutmeg, onion or onion powder, parsley, pepper,   rosemary, sage  °Chicken Basil, cloves, cranberries, mace, mushrooms (fresh), nutmeg, oregano, paprika, parsley, pineapple, saffron, sage, savory, tarragon, thyme, tomato,  turmeric  °Egg Chervil, curry, dill, dry mustard, garlic or garlic powder, green pepper, jelly, mushrooms (fresh), nutmeg, onion powder, paprika, parsley, rosemary, tarragon, tomato  °Fish Basil, bay leaf, chervil, curry, dill, dry mustard, green pepper, lemon juice, marjoram, mushrooms (fresh), paprika, pepper, tarragon, tomato, turmeric  °Lamb Cloves, curry, dill, garlic or garlic powder, mace, mint, mint jelly, onion, oregano, parsley, pineapple, rosemary, tarragon, thyme  °Pork Applesauce, basil, caraway, chives, cloves, garlic or garlic powder, onion or onion powder, rosemary, thyme  °Veal Apricots, basil, bay leaf, currant jelly, curry, ginger, marjoram, mushrooms (fresh), oregano, paprika  °Vegetables Basil, dill, garlic or garlic powder, ginger, lemon juice, mace, marjoram, nutmeg, onion or onion powder, tarragon, tomato, sugar or sugar substitute, salt-free salad dressing, vinegar  °Desserts Allspice, anise, cinnamon, cloves, ginger, mace, nutmeg, vanilla extract, other extracts  ° °Copyright 2020 © [employer]. All rights reserved ° °Fluid Restricted Nutrition Therapy  °You have been prescribed this diet because your condition affects how much fluid you can eat or drink. If your heart, liver, or kidneys aren't working properly, you may not be able to effectively eliminate fluids from the body and this may cause swelling (edema) in the legs, arms, and/or stomach. °Drink no more than 1.5  liters or 50 ounces or 6 1/4 cups of fluid per day.  °• You don't need to stop eating or drinking the same fluids you normally would, but you may need to eat or drink less than usual.  °• Your registered dietitian nutritionist will help you determine the correct amount of fluid to consume during the day °Breakfast Include fluids taken with medications  °Lunch Include fluids taken with medications  °Dinner Include fluids taken with medications  °Bedtime Snack Include fluids taken with medications   ° ° ° °Tips °What Are Fluids? ° °A fluid is anything that is liquid or anything that would melt if left at room temperature. You will need to count these foods and liquids--including any liquid used to take medication--as part of your daily fluid intake. Some examples are: °• Alcohol (drink only with your doctor's permission)  °• Coffee, tea, and other hot beverages  °• Gelatin (Jell-O)  °• Gravy  °• Ice cream, sherbet, sorbet  °• Ice cubes, ice chips  °• Milk, liquid creamer  °• Nutritional supplements  °• Popsicles  °• Vegetable and fruit juices; fluid in canned fruit  °• Watermelon  °• Yogurt  °• Soft drinks, lemonade, limeade  °• Soups  °• Syrup °How Do I Measure My Fluid Intake? °• Record your fluid intake daily.  °• Tip: Every day, each time you eat or drink fluids, pour water in the same amount into an empty container that can hold the same amount of fluids you are allowed daily. This may help you keep track of how much fluid you are taking in throughout the day.  °• To accurately keep track of how much liquid you take in, measure the size of the cups, glasses, and bowls you use. If you eat soup, measure how much of it is liquid and how much is solid (such as noodles, vegetables, meat). °Conversions for Measuring Fluid Intake  °Milliliters (mL) Liters (L) Ounces (oz) Cups (c)  °1000 1 32 4  °1200 1.2 40 5  °1500 1.5 50 6 1/4  °1800 1.8 60 7 1/2  °2000 2 67   8 1/3  °Tips to Reduce Your Thirst °• Chew gum or suck on hard candy.  °• Rinse or gargle with mouthwash. Do not swallow.  °• Ice chips or popsicles my help quench thirst, but this too needs to be calculated into the total restriction. Melt ice chips or cubes first to figure out how much fluid they produce (for example, experiment with melting ½ cup ice chips or 2 ice cubes).  °• Add a lemon wedge to your water.  °• Limit how much salt you take in. A high salt intake might make you thirstier.  °• Don't eat or drink all your allowed liquids at once. Space  your liquids out through the day.  °• Use small glasses and cups and sip slowly. If allowed, take your medications with fluids you eat or drink during a meal.  ° °Fluid-Restricted Nutrition Therapy Sample 1-Day Menu  °Breakfast 1 slice wheat toast  °1 tablespoon peanut butter  °1/2 cup yogurt (120 milliliters)  °1/2 cup blueberries  °1 cup milk (240 milliliters)   °Lunch 3 ounces sliced turkey  °2 slices whole wheat bread  °1/2 cup lettuce for sandwich  °2 slices tomato for sandwich  °1 ounce reduced-fat, reduced-sodium cheese  °1/2 cup fresh carrot sticks  °1 banana  °1 cup unsweetened tea (240 milliliters)   °Evening Meal 8 ounces soup (240 milliliters)  °3 ounces salmon  °1/2 cup quinoa  °1 cup green beans  °1 cup mixed greens salad  °1 tablespoon olive oil  °1 cup coffee (240 milliliters)  °Evening Snack 1/2 cup sliced peaches  °1/2 cup frozen yogurt (120 milliliters)  °1 cup water (240 milliliters)  °Copyright 2020 © [employer]. All rights reserved °

## 2018-08-13 NOTE — Plan of Care (Signed)
Discussed with night nurse blood bank concern about patient getting blood transfusion when hemoglobin greater than 7 ( 7.5). In setting of active cardiac disease ( CHF, AF) goal hemoglobin is 8. Given patient with dyspnea for CHF planned for 1 U transfusion followed by lasix to be given post transfusion. Hopefully able to give tonight. Is not emergent but to optimize in setting of active cardiac disease  Desiree Hane, MD

## 2018-08-13 NOTE — Progress Notes (Signed)
Patient ID: DESMOND SZABO, male   DOB: Sep 26, 1934, 84 y.o.   MRN: [id number]   Progress Note  Patient Name: Thomas [name] Date of Encounter: 08/13/2018  Primary Cardiologist: Sanda Klein, MD   Subjective   No chest pain. Breathing is better.   Inpatient Medications    Scheduled Meds:  apixaban  5 mg Oral BID   carvedilol  12.5 mg Oral Q breakfast   ferrous sulfate  325 mg Oral Daily   furosemide  40 mg Intravenous BID   isosorbide mononitrate  90 mg Oral Daily   losartan  50 mg Oral Daily   pantoprazole  40 mg Oral Daily   simvastatin  20 mg Oral q1800   sodium chloride flush  3 mL Intravenous Q12H   spironolactone  12.5 mg Oral Daily   vitamin B-12  500 mcg Oral Daily   Continuous Infusions:  sodium chloride     PRN Meds: sodium chloride, acetaminophen, albuterol, alum & mag hydroxide-simeth, guaiFENesin-dextromethorphan, HYDROcodone-acetaminophen, hydrocortisone cream, loperamide, magnesium hydroxide, morphine injection, ondansetron (ZOFRAN) IV, sodium chloride flush   Vital Signs    Vitals:   08/12/18 1100 08/12/18 2042 08/13/18 0627 08/13/18 0627  BP:  (!) 106/59  103/63  Pulse:  (!) 59  60  Resp:  20  20  Temp:  98.7 F (37.1 C)  98 F (36.7 C)  TempSrc:    Oral  SpO2:  94%  93%  Weight:   103.4 kg   Height: 6\' 2"  (1.88 m)       Intake/Output Summary (Last 24 hours) at 08/13/2018 0939 Last data filed at 08/13/2018 5277 Gross per 24 hour  Intake 1120 ml  Output 2975 ml  Net -1855 ml   Filed Weights   08/12/18 0540 08/13/18 0627  Weight: 112 kg 103.4 kg    Telemetry    Atrial fib with ventricular pacing - Personally Reviewed  ECG    none - Personally Reviewed  Physical Exam   GEN: No acute distress.   Neck: No JVD Cardiac: RRR, no murmurs, rubs, or gallops.  Respiratory: Clear to auscultation bilaterally. GI: Soft, nontender, non-distended  MS: No edema; No deformity. Neuro:  Nonfocal  Psych: Normal affect   Labs      Chemistry Recent Labs  Lab 08/11/18 2000 08/13/18 0502  NA 140 138  K 4.5 4.1  CL 109 104  CO2 26 26  GLUCOSE 121* 97  BUN 29* 24*  CREATININE 1.26* 1.43*  CALCIUM 8.4* 8.1*  GFRNONAA 53* 46*  GFRAA >60 53*  ANIONGAP 5 8     Hematology Recent Labs  Lab 08/11/18 2000 08/13/18 0502  WBC 7.9 7.1  RBC 3.57* 3.20*  HGB 8.2* 7.5*  HCT 27.8* 24.4*  MCV 77.9* 76.3*  MCH 23.0* 23.4*  MCHC 29.5* 30.7  RDW 19.4* 19.5*  PLT 301 232    Cardiac Enzymes Recent Labs  Lab 08/11/18 2246 08/12/18 0613 08/12/18 1200 08/12/18 1752  TROPONINI 0.03* 0.03* 0.03* 0.03*   No results for input(s): TROPIPOC in the last 168 hours.   BNP Recent Labs  Lab 08/11/18 2246  BNP 316.9*     DDimer No results for input(s): DDIMER in the last 168 hours.   Radiology    Dg Ribs Unilateral W/chest Left  Result Date: 08/12/2018 CLINICAL DATA:  Recent falls with left-sided chest pain, initial encounter EXAM: LEFT RIBS AND CHEST - 3+ VIEW COMPARISON:  05/25/2018 FINDINGS: Cardiac shadow is enlarged. Defibrillator is again seen as well  as postoperative changes. Lungs are well aerated bilaterally. Stable small left pleural effusion is noted as well as left basilar atelectasis. No acute rib abnormality is noted. IMPRESSION: No acute rib abnormality is seen. Stable small left pleural effusion and left basilar atelectasis. Electronically Signed   By: Inez Catalina M.D.   On: 08/12/2018 00:59   Ct Head Wo Contrast  Result Date: 08/12/2018 CLINICAL DATA:  Recent falls with blood thinners EXAM: CT HEAD WITHOUT CONTRAST TECHNIQUE: Contiguous axial images were obtained from the base of the skull through the vertex without intravenous contrast. COMPARISON:  05/19/2018 FINDINGS: Brain: Chronic atrophic and ischemic changes are noted. Lacunar infarcts are noted within the left thalamus better visualized than on the prior exam. No findings to suggest acute hemorrhage, acute infarction or space-occupying mass  lesion are noted. Vascular: No hyperdense vessel or unexpected calcification. Skull: Normal. Negative for fracture or focal lesion. Sinuses/Orbits: No acute finding. Other: None. IMPRESSION: Chronic atrophic and ischemic changes. 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 No acute abnormality noted. Electronically Signed   By: Inez Catalina M.D.   On: 08/12/2018 02:06   Dg Humerus Left  Result Date: 08/12/2018 CLINICAL DATA:  Increasing falls over last 2 weeks with left arm pain, initial encounter EXAM: LEFT HUMERUS - 2+ VIEW COMPARISON:  None. FINDINGS: Degenerative changes of the acromioclavicular joint are noted. No acute fracture or dislocation is noted. No soft tissue abnormality is seen. IMPRESSION: No acute abnormality noted. Electronically Signed   By: Inez Catalina M.D.   On: 08/12/2018 00:57    Cardiac Studies   none  Patient Profile     84 y.o. male admitted with acute on chronic systolic heart failure  Assessment & Plan    1. Acute on chronic systolic heart failure - he has diuresed almost 20 lbs. He will continue IV lasix and other home meds. 2. Venous stasis - he will need support stockings at discharge. 3. Stage 2 renal insufficiency - we will follow his renal function. 4. Atrial fib - I think that this is now chronic and his rates are controlled.     For questions or updates, please contact [place] Please consult [email or web address] for contact info under Cardiology/STEMI.      Signed, Cristopher Peru, MD  08/13/2018, 9:39 AM

## 2018-08-14 ENCOUNTER — Inpatient Hospital Stay (HOSPITAL_COMMUNITY): Payer: PPO

## 2018-08-14 DIAGNOSIS — I959 Hypotension, unspecified: Secondary | ICD-10-CM | POA: Diagnosis not present

## 2018-08-14 LAB — TYPE AND SCREEN
ABO/RH(D): O POS
Antibody Screen: NEGATIVE
Unit division: 0

## 2018-08-14 LAB — BASIC METABOLIC PANEL
Anion gap: 9 (ref 5–15)
BUN: 25 mg/dL — ABNORMAL HIGH (ref 8–23)
CO2: 26 mmol/L (ref 22–32)
Calcium: 8.2 mg/dL — ABNORMAL LOW (ref 8.9–10.3)
Chloride: 101 mmol/L (ref 98–111)
Creatinine, Ser: 1.68 mg/dL — ABNORMAL HIGH (ref 0.61–1.24)
GFR calc Af Amer: 43 mL/min — ABNORMAL LOW (ref >60)
GFR calc non Af Amer: 38 mL/min — ABNORMAL LOW (ref >60)
Glucose, Bld: 101 mg/dL — ABNORMAL HIGH (ref 70–99)
Potassium: 4.1 mmol/L (ref 3.5–5.1)
Sodium: 136 mmol/L (ref 135–145)

## 2018-08-14 LAB — CBC
HCT: 26.1 % — ABNORMAL LOW (ref 39.0–52.0)
HCT: 25 % — ABNORMAL LOW (ref 39.0–52.0)
HCT: 25.2 % — ABNORMAL LOW (ref 39.0–52.0)
Hemoglobin: 8 g/dL — ABNORMAL LOW (ref 13.0–17.0)
Hemoglobin: 7.8 g/dL — ABNORMAL LOW (ref 13.0–17.0)
Hemoglobin: 7.9 g/dL — ABNORMAL LOW (ref 13.0–17.0)
MCH: 23.1 pg — ABNORMAL LOW (ref 26.0–34.0)
MCH: 23.7 pg — ABNORMAL LOW (ref 26.0–34.0)
MCH: 23.7 pg — ABNORMAL LOW (ref 26.0–34.0)
MCHC: 30.7 g/dL (ref 30.0–36.0)
MCHC: 31.2 g/dL (ref 30.0–36.0)
MCHC: 31.3 g/dL (ref 30.0–36.0)
MCV: 75.2 fL — ABNORMAL LOW (ref 80.0–100.0)
MCV: 76 fL — ABNORMAL LOW (ref 80.0–100.0)
MCV: 75.4 fL — ABNORMAL LOW (ref 80.0–100.0)
Platelets: 245 10*3/uL (ref 150–400)
Platelets: 232 10*3/uL (ref 150–400)
Platelets: 240 10*3/uL (ref 150–400)
RBC: 3.47 MIL/uL — ABNORMAL LOW (ref 4.22–5.81)
RBC: 3.29 MIL/uL — ABNORMAL LOW (ref 4.22–5.81)
RBC: 3.34 MIL/uL — ABNORMAL LOW (ref 4.22–5.81)
RDW: 19.1 % — ABNORMAL HIGH (ref 11.5–15.5)
RDW: 19.3 % — ABNORMAL HIGH (ref 11.5–15.5)
RDW: 19.3 % — ABNORMAL HIGH (ref 11.5–15.5)
WBC: 6.6 10*3/uL (ref 4.0–10.5)
WBC: 7.5 10*3/uL (ref 4.0–10.5)
WBC: 6.7 10*3/uL (ref 4.0–10.5)
nRBC: 0 % (ref 0.0–0.2)
nRBC: 0 % (ref 0.0–0.2)
nRBC: 0 % (ref 0.0–0.2)

## 2018-08-14 LAB — BPAM RBC
Blood Product Expiration Date: 202006042359
ISSUE DATE / TIME: 202005302337
Unit Type and Rh: 5100

## 2018-08-14 LAB — LACTIC ACID, PLASMA
Lactic Acid, Venous: 0.9 mmol/L (ref 0.5–1.9)
Lactic Acid, Venous: 1.6 mmol/L (ref 0.5–1.9)

## 2018-08-14 MED ORDER — SODIUM CHLORIDE 0.9 % IV BOLUS
500.0000 mL | Freq: Once | INTRAVENOUS | Status: AC
  Administered 2018-08-14: 500 mL via INTRAVENOUS

## 2018-08-14 MED ORDER — POLYETHYLENE GLYCOL 3350 17 G PO PACK: 17.0000 g | PACK | Freq: Every day | ORAL | Status: DC | PRN

## 2018-08-14 MED ORDER — SENNOSIDES-DOCUSATE SODIUM 8.6-50 MG PO TABS: 1.0000 | ORAL_TABLET | Freq: Every evening | ORAL | Status: DC | PRN

## 2018-08-14 NOTE — Progress Notes (Addendum)
Patient's BP is 96/54 1 hour after previous check.   Paged MD  MD aware- will order 500 ml bolus for patient and a CT abd.  Concern for bleeding.    Called interpreter service to get an interpreter for MD to explain to MD.  Son called during this time.  Gave update to son regarding father's care  Patient taken to CT- [facility] nurse and interpreter  went down with patient.

## 2018-08-14 NOTE — Progress Notes (Signed)
Discussed patients medications and assessment with patient using an interpreter.  Patient was also able to communicate with doctors while interpreter was present.

## 2018-08-14 NOTE — Plan of Care (Signed)
  Problem: Education: Goal: Ability to demonstrate management of disease process will improve Outcome: Progressing   Problem: Education: Goal: Ability to verbalize understanding of medication therapies will improve Outcome: Progressing   Problem: Activity: Goal: Capacity to carry out activities will improve Outcome: Progressing   Problem: Cardiac: Goal: Ability to achieve and maintain adequate cardiopulmonary perfusion will improve Outcome: Progressing   Problem: Education: Goal: Knowledge of General Education information will improve Description Including pain rating scale, medication(s)/side effects and non-pharmacologic comfort measures Outcome: Progressing   Problem: Health Behavior/Discharge Planning: Goal: Ability to manage health-related needs will improve Outcome: Progressing   Problem: Activity: Goal: Risk for activity intolerance will decrease Outcome: Progressing

## 2018-08-14 NOTE — Progress Notes (Addendum)
[facility] PROGRESS NOTE  Thomas [name] LAG:[id number] DOB: [birth year] DOA: 08/11/2018 PCP: [name], MD  Assessment/Plan: 1. Hypotensive episode.  Witnessed by nursing this morning.  Blood pressure 155 followed by 75/47 then 88/57.  Nurse reported brief lightheadedness but able to maintain normal mentation.  Could be related to overdiuresis however patient's hemoglobin only went up point 1:05 unit, Could b potential bleed.  Denies any melena or bright red blood per stool.  Will check CBC now, discontinue further diuresis, stop losartan, coreg closely monitor BP, check lactate, pending results may need to discontinue eliquis and consult GI.   Addendum: 1:33 pm: cbc shows hgb of 7.8 (previous 8, baseline hgb 7.5-8), BP slightly better in 90s and patient mentating ok. Will continue to trend cbc, stool always dark given he is on iron, unless hgb drops below 7 or we see bright red blood not sure this is a GI bleed so no need to consult GI currently. If becomes persistently hypotensive will need stepdown monitoring at the minimum. CT abd STAT ordered to ensure no acute pathology there given acute clinical change 2. Acute on chronic ischemic CHF with combined systolic/diastolic exacerbation (TTE, 2/20: EF 35- 40%), improving. Down 4.7 L this admission but weight has gone up reportedly 7lbs from yesterday, seems inaccurate given clinical improvement (decreased dyspnea, decrease in peripheral edema); down 12 pounds from admission weight.  Will hold off on further diuresis given hypotensive episode mentioned above.  Continue to monitor volume status, losartan, Coreg, cardiology recommended adding spironolactone 12.5 mg daily, closely monitor renal function daily weights, strict I's and O's, fluid restriction, low-salt diet 3. Reported falls x2.  Orthostatics negative on admission.  Patient is a poor historian but thinks he lost his balance while standing denied any shocks, or prodromal symptoms  cardiology interrogated ICD. Pain control with flexeril PRN. XR imaging negative for fracture of left arm and ribs.  Pain management with Tylenol, Flexeril, Norco, discontinue IV morphine for breakthrough given improvement. 4. Paroxysmal atrial fibrillation, ventricular paced rhythm on telemetry, continue Coreg,Continue apixaban 5. HTN,, brief episode of hypotension.  If persists may need to discontinue beta-blocker, closely monitor.   6. CAD status post CABG (remote history), stable.  Continue home Imdur, Zocor. 7. CKD stage III stable at baseline (1.2-1.7), avoid nephrotoxins, monitor BMP, monitor output. 8. Hyperlipidemia/CAD, continue statin 9. Microcytic anemia secondary to iron deficient anemia, history of acute blood loss anemia.  Recent admission in March for symptomatic anemia related to erythematous to adenopathy.  Patient had plan outpatient evaluation by [facility] ( small bowel capsule endoscopy)of his iron deficiency anemia and reported melena (last seen in office by GI on 06/23/2018)Currentlydenies any melena or bright red blood per rectum, status post 1 U given goal hemoglobin > 8 ( cardiac disease) on 5/30, repeat CBC this afternoon   Code Status: FULL CODE Family Communication: none at bedside (indicate person spoken with, relationship, and if by phone, the number) Disposition Plan: Hold further diuresis, monitor blood pressure, monitor CBC, lactate.   Consultants:  Cardiology  Procedures:  none  Antibiotics:  none (indicate start date, and stop date if known)  HPI/Subjective:  Thomas [name] is a 84 y.o. year old male with medical history significant for CHF/ischemic cardiomyopathy status post biventricular ICD, paroxysmal atrial fibrillation on Eliquis, HTN, CKD stage III, CAD status post CABG in 1986, chronic anemia, deafness, and recent admission from 3/11-3/14 2020 symptomatic anemia secondary to erythematous throughout the base of EGDwho presented on 08/11/2018 with  worsening lower leg swelling, falls x2 and dyspnea and was found to have acute on chronic combined systolic/diastolic CHF exacerbation.   "I feel much better"  Objective: Vitals:   08/14/18 1045 08/14/18 1057  BP: (!) 75/47 (!) 88/57  Pulse: (!) 59 62  Resp:    Temp:    SpO2: 93% 92%    Intake/Output Summary (Last 24 hours) at 08/14/2018 1203 Last data filed at 08/14/2018 0750 Gross per 24 hour  Intake 1739.5 ml  Output 2800 ml  Net -1060.5 ml   Filed Weights   08/12/18 0540 08/13/18 0627 08/14/18 0504  Weight: 112 kg 103.4 kg 106.7 kg    Exam:   General: Elderly male, sitting in bedside chair, no distress  Cardiovascular: Regular rate and rhythm, loud systolic murmur, loudest at apex, 1+ pitting edema bilateral lower extremities to calfs (improved prior exam)  Respiratory: Normal respiratory effort on room air, minimal crackles at bases  Abdomen: Soft, nondistended, nontender, normal bowel sounds  Musculoskeletal: improvement in range of motion of left shoulder/arm due to pain  Skin: bruising of left lateral arm  Neurologic alert oriented x4, deaf, no appreciable focal deficits otherwise  Data Reviewed: Basic Metabolic Panel: Recent Labs  Lab 08/11/18 2000 08/13/18 0502 08/14/18 0545  NA 140 138 136  K 4.5 4.1 4.1  CL 109 104 101  CO2 26 26 26   GLUCOSE 121* 97 101*  BUN 29* 24* 25*  CREATININE 1.26* 1.43* 1.68*  CALCIUM 8.4* 8.1* 8.2*   Liver Function Tests: No results for input(s): AST, ALT, ALKPHOS, BILITOT, PROT, ALBUMIN in the last 168 hours. No results for input(s): LIPASE, AMYLASE in the last 168 hours. No results for input(s): AMMONIA in the last 168 hours. CBC: Recent Labs  Lab 08/11/18 2000 08/13/18 0502 08/14/18 0545  WBC 7.9 7.1 6.6  HGB 8.2* 7.5* 8.0*  HCT 27.8* 24.4* 26.1*  MCV 77.9* 76.3* 75.2*  PLT 301 232 245   Cardiac Enzymes: Recent Labs  Lab 08/11/18 2246 08/12/18 0613 08/12/18 1200 08/12/18 1752  TROPONINI 0.03*  0.03* 0.03* 0.03*   BNP (last 3 results) Recent Labs    03/14/18 1736 05/25/18 1651 08/11/18 2246  BNP 375.8* 292.5* 316.9*    ProBNP (last 3 results) No results for input(s): PROBNP in the last 8760 hours.  CBG: No results for input(s): GLUCAP in the last 168 hours.  Recent Results (from the past 240 hour(s))  SARS Coronavirus 2 (CEPHEID - Performed in [place] hospital lab), Hosp Order     Status: None   Collection Time: 08/11/18 10:46 PM  Result Value Ref Range Status   SARS Coronavirus 2 NEGATIVE NEGATIVE Final    Comment: (NOTE) If result is NEGATIVE SARS-CoV-2 target nucleic acids are NOT DETECTED. The SARS-CoV-2 RNA is generally detectable in upper and lower  respiratory specimens during the acute phase of infection. The lowest  concentration of SARS-CoV-2 viral copies this assay can detect is 250  copies / mL. A negative result does not preclude SARS-CoV-2 infection  and should not be used as the sole basis for treatment or other  patient management decisions.  A negative result may occur with  improper specimen collection / handling, submission of specimen other  than nasopharyngeal swab, presence of viral mutation(s) within the  areas targeted by this assay, and inadequate number of viral copies  (<250 copies / mL). A negative result must be combined with clinical  observations, patient history, and epidemiological information. If result is POSITIVE SARS-CoV-2 target  nucleic acids are DETECTED. The SARS-CoV-2 RNA is generally detectable in upper and lower  respiratory specimens dur ing the acute phase of infection.  Positive  results are indicative of active infection with SARS-CoV-2.  Clinical  correlation with patient history and other diagnostic information is  necessary to determine patient infection status.  Positive results do  not rule out bacterial infection or co-infection with other viruses. If result is PRESUMPTIVE POSTIVE SARS-CoV-2 nucleic acids  MAY BE PRESENT.   A presumptive positive result was obtained on the submitted specimen  and confirmed on repeat testing.  While 2019 novel coronavirus  (SARS-CoV-2) nucleic acids may be present in the submitted sample  additional confirmatory testing may be necessary for epidemiological  and / or clinical management purposes  to differentiate between  SARS-CoV-2 and other Sarbecovirus currently known to infect humans.  If clinically indicated additional testing with an alternate test  methodology [phone]) is advised. The SARS-CoV-2 RNA is generally  detectable in upper and lower respiratory sp ecimens during the acute  phase of infection. The expected result is Negative. Fact Sheet for Patients:  StrictlyIdeas.no Fact Sheet for Healthcare Providers: BankingDealers.co.za This test is not yet approved or cleared by the [place] FDA and has been authorized for detection and/or diagnosis of SARS-CoV-2 by FDA under an Emergency Use Authorization (EUA).  This EUA will remain in effect (meaning this test can be used) for the duration of the COVID-19 declaration under Section 564(b)(1) of the Act, 21 U.S.C. section 360bbb-3(b)(1), unless the authorization is terminated or revoked sooner. Performed at [facility], [place] [address] [place], [place] [address]      Studies: No results found.  Scheduled Meds: . sodium chloride   Intravenous Once  . apixaban  5 mg Oral BID  . carvedilol  12.5 mg Oral Q breakfast  . ferrous sulfate  325 mg Oral Daily  . isosorbide mononitrate  90 mg Oral Daily  . losartan  50 mg Oral Daily  . pantoprazole  40 mg Oral Daily  . simvastatin  20 mg Oral q1800  . sodium chloride flush  3 mL Intravenous Q12H  . spironolactone  12.5 mg Oral Daily  . vitamin B-12  500 mcg Oral Daily   Continuous Infusions: . sodium chloride      Principal Problem:   Acute on chronic combined systolic and diastolic  CHF (congestive heart failure) (HCC) Active Problems:   Permanent atrial fibrillation [facility])   Biventricular ICD Med[employer] Viva single chamber October 2014   Benign essential HTN   Status post coronary artery bypass grafting   Bilateral deafness   Anemia   Acute on chronic systolic CHF (congestive heart failure) ([place])      Desiree Hane  [facility]

## 2018-08-14 NOTE — Progress Notes (Signed)
Per CCMD, 7 beats of wide QRS, not vtach. Will review strip and continue to monitor.

## 2018-08-14 NOTE — Progress Notes (Addendum)
Patient started complaining about dizziness.  Check vitals- patient BP was 75/47 (recheck- 69/44)   Patient's looked pale and unstable upon standing.  Assisted patient back to bed (with 2 person assist and gait belt)  Rechecked BP while lying  - 88/57)    MD is aware

## 2018-08-14 NOTE — Progress Notes (Signed)
Patient had BM, no visible blood.

## 2018-08-14 NOTE — Progress Notes (Signed)
Progress Note  Patient Name: Thomas [name] Date of Encounter: 08/14/2018  Primary Cardiologist: Sanda Klein, MD   Subjective   Denies dyspnea. C/o tingling in lower legs.  Inpatient Medications    Scheduled Meds: . sodium chloride   Intravenous Once  . apixaban  5 mg Oral BID  . carvedilol  12.5 mg Oral Q breakfast  . ferrous sulfate  325 mg Oral Daily  . furosemide  40 mg Intravenous BID  . isosorbide mononitrate  90 mg Oral Daily  . losartan  50 mg Oral Daily  . pantoprazole  40 mg Oral Daily  . simvastatin  20 mg Oral q1800  . sodium chloride flush  3 mL Intravenous Q12H  . spironolactone  12.5 mg Oral Daily  . vitamin B-12  500 mcg Oral Daily   Continuous Infusions: . sodium chloride     PRN Meds: sodium chloride, acetaminophen, albuterol, alum & mag hydroxide-simeth, cyclobenzaprine, guaiFENesin-dextromethorphan, HYDROcodone-acetaminophen, hydrocortisone cream, loperamide, magnesium hydroxide, morphine injection, ondansetron (ZOFRAN) IV, polyethylene glycol, senna-docusate, sodium chloride flush   Vital Signs    Vitals:   08/14/18 0504 08/14/18 0905 08/14/18 1045 08/14/18 1057  BP: 119/67 (!) 100/55 (!) 75/47 (!) 88/57  Pulse: 64 64 (!) 59 62  Resp: 18     Temp: 98.3 F (36.8 C)     TempSrc: Oral     SpO2: 93% 93% 93% 92%  Weight:      Height:        Intake/Output Summary (Last 24 hours) at 08/14/2018 1110 Last data filed at 08/14/2018 0750 Gross per 24 hour  Intake 1739.5 ml  Output 3250 ml  Net -1510.5 ml   Filed Weights   08/12/18 0540 08/13/18 0627 08/14/18 0504  Weight: 112 kg 103.4 kg 106.7 kg    Telemetry    Atrial fib with ventricular pacing - Personally Reviewed  ECG    none - Personally Reviewed  Physical Exam   GEN: No acute distress.   Neck: No JVD Cardiac: RRR, no murmurs, rubs, or gallops.  Respiratory: Clear to auscultation bilaterally. GI: Soft, nontender, non-distended  MS: No edema; No deformity. Neuro:  Nonfocal   Psych: Normal affect   Labs    Chemistry Recent Labs  Lab 08/11/18 2000 08/13/18 0502 08/14/18 0545  NA 140 138 136  K 4.5 4.1 4.1  CL 109 104 101  CO2 26 26 26   GLUCOSE 121* 97 101*  BUN 29* 24* 25*  CREATININE 1.26* 1.43* 1.68*  CALCIUM 8.4* 8.1* 8.2*  GFRNONAA 53* 46* 38*  GFRAA >60 53* 43*  ANIONGAP 5 8 9      Hematology Recent Labs  Lab 08/11/18 2000 08/13/18 0502 08/14/18 0545  WBC 7.9 7.1 6.6  RBC 3.57* 3.20* 3.47*  HGB 8.2* 7.5* 8.0*  HCT 27.8* 24.4* 26.1*  MCV 77.9* 76.3* 75.2*  MCH 23.0* 23.4* 23.1*  MCHC 29.5* 30.7 30.7  RDW 19.4* 19.5* 19.1*  PLT 301 232 245    Cardiac Enzymes Recent Labs  Lab 08/11/18 2246 08/12/18 0613 08/12/18 1200 08/12/18 1752  TROPONINI 0.03* 0.03* 0.03* 0.03*   No results for input(s): TROPIPOC in the last 168 hours.   BNP Recent Labs  Lab 08/11/18 2246  BNP 316.9*     DDimer No results for input(s): DDIMER in the last 168 hours.   Radiology    No results found.  Cardiac Studies   none  Patient Profile     84 y.o. male admitted with falls and worsening CHF  also anemic.   Assessment & Plan    1. Acute on chronic systolic heart failure - he feels better but his weight is up. He will probably need to back off on the lasix as his creatinine is worse. 2. Anemia - he was transfused a liter and HCT only went up 1.5 points. I wonder if he has a slow bleed in his gut. 3. Atrial fib - his rates are controlled. 4. ICD - interogation 3 weeks ago demonstrated normal device function but his fluid index was going up.       For questions or updates, please contact [place] Please consult [email or web address] for contact info under Cardiology/STEMI.      Signed, Cristopher Peru, MD  08/14/2018, 11:10 AM  Patient ID: [name], male   DOB: 12-Apr-1934, 84 y.o.   MRN: [id number]

## 2018-08-15 DIAGNOSIS — I9589 Other hypotension: Secondary | ICD-10-CM

## 2018-08-15 DIAGNOSIS — I4821 Permanent atrial fibrillation: Secondary | ICD-10-CM

## 2018-08-15 DIAGNOSIS — E861 Hypovolemia: Secondary | ICD-10-CM

## 2018-08-15 LAB — CBC
HCT: 25.6 % — ABNORMAL LOW (ref 39.0–52.0)
HCT: 27.1 % — ABNORMAL LOW (ref 39.0–52.0)
Hemoglobin: 7.8 g/dL — ABNORMAL LOW (ref 13.0–17.0)
Hemoglobin: 8.2 g/dL — ABNORMAL LOW (ref 13.0–17.0)
MCH: 23 pg — ABNORMAL LOW (ref 26.0–34.0)
MCH: 23.4 pg — ABNORMAL LOW (ref 26.0–34.0)
MCHC: 30.5 g/dL (ref 30.0–36.0)
MCHC: 30.3 g/dL (ref 30.0–36.0)
MCV: 75.5 fL — ABNORMAL LOW (ref 80.0–100.0)
MCV: 77.2 fL — ABNORMAL LOW (ref 80.0–100.0)
Platelets: 219 10*3/uL (ref 150–400)
Platelets: 240 10*3/uL (ref 150–400)
RBC: 3.39 MIL/uL — ABNORMAL LOW (ref 4.22–5.81)
RBC: 3.51 MIL/uL — ABNORMAL LOW (ref 4.22–5.81)
RDW: 19.6 % — ABNORMAL HIGH (ref 11.5–15.5)
RDW: 19.9 % — ABNORMAL HIGH (ref 11.5–15.5)
WBC: 6.1 10*3/uL (ref 4.0–10.5)
WBC: 7 10*3/uL (ref 4.0–10.5)
nRBC: 0 % (ref 0.0–0.2)
nRBC: 0 % (ref 0.0–0.2)

## 2018-08-15 LAB — BASIC METABOLIC PANEL
Anion gap: 6 (ref 5–15)
BUN: 25 mg/dL — ABNORMAL HIGH (ref 8–23)
CO2: 25 mmol/L (ref 22–32)
Calcium: 8.1 mg/dL — ABNORMAL LOW (ref 8.9–10.3)
Chloride: 105 mmol/L (ref 98–111)
Creatinine, Ser: 1.69 mg/dL — ABNORMAL HIGH (ref 0.61–1.24)
GFR calc Af Amer: 43 mL/min — ABNORMAL LOW (ref >60)
GFR calc non Af Amer: 37 mL/min — ABNORMAL LOW (ref >60)
Glucose, Bld: 98 mg/dL (ref 70–99)
Potassium: 4.2 mmol/L (ref 3.5–5.1)
Sodium: 136 mmol/L (ref 135–145)

## 2018-08-15 NOTE — Progress Notes (Signed)
[facility] PROGRESS NOTE  Thomas [name]:[id number] DOB: [birth year] DOA: 08/11/2018 PCP: [name], MD  Assessment/Plan: 1. Hypotension.  Suspect either overdiuresis or slow GI bleed.  SBP's ranging 108-123 currently much better than yesterday.  Hemoglobin stable and no active signs of blood loss, CT abdomen unremarkable.  Continue to monitor off home BP meds and holding diuretics.    2. Acute on chronic Microcytic anemia secondary to iron deficient anemia.  Recent admission in March for symptomatic anemia related to erythematous to adenopathy.  Hypotensive episode with poor hemoglobin response to 1 unit yesterday concerning for potential GI bleed, however, hemoglobin stable at 8.2, without melena.  Continue to monitor off Eliquis.  Will likely still need outpatient evaluation   3. Acute on chronic ischemic CHF with combined systolic/diastolic exacerbation (TTE, 2/20: EF 35- 40%).  Weights inconsistent, negative for much, like related to fluid resuscitation received yesterday for hypotension.  Minimal swelling on exam, hold diuresis while monitoring for hypotension.   4. Reported falls x2 prior to admission.  Orthostatics negative on admission.  No findings on ICD interrogation.  X-ray left arm/ribs negative for acute fractures.   Pain management with PRN Tylenol, Flexeril, Norco,   5. Paroxysmal atrial fibrillation, ventricular paced rhythm on telemetry, continue Coreg, holding Eliquis while monitoring for any bleed/drop in hemoglobin  6. HTN.  Discontinued home beta-blockers in setting of hypotension yesterday, BP better but still relative hypotension, so will monitor.  7. CAD status post CABG (remote history), Asymptomatic  Continue home Imdur, Zocor.  8. CKD stage III stable at baseline (1.2-1.7), avoid nephrotoxins, monitor BMP, monitor output.  9. Hyperlipidemia. continue statin   Code Status: FULL CODE Family Communication: Wife updated with ASL interpreter at bedside  (indicate person spoken with, relationship, and if by phone, the number) Disposition Plan: Hold further diuresis, monitor blood pressure, monitor CBC,   Consultants:  Cardiology  Procedures:  none  Antibiotics:  none (indicate start date, and stop date if known)  HPI/Subjective:  Thomas [name] is a 84 y.o. year old male with medical history significant for CHF/ischemic cardiomyopathy status post biventricular ICD, paroxysmal atrial fibrillation on Eliquis, HTN, CKD stage III, CAD status post CABG in 1986, chronic anemia, deafness, and recent admission from 3/11-3/14 2020 symptomatic anemia secondary to erythematous throughout the base of EGDwho presented on 08/11/2018 with worsening lower leg swelling, falls x2 and dyspnea and was found to have acute on chronic combined systolic/diastolic CHF exacerbation.   No abdominal pain No chest pain No dyspnea  Objective: Vitals:   08/15/18 0932 08/15/18 1207  BP: 109/70 108/70  Pulse: 62 62  Resp:  18  Temp:  98.6 F (37 C)  SpO2:  96%    Intake/Output Summary (Last 24 hours) at 08/15/2018 1436 Last data filed at 08/15/2018 1329 Gross per 24 hour  Intake 1103.12 ml  Output 1300 ml  Net -196.88 ml   Filed Weights   08/13/18 0627 08/14/18 0504 08/15/18 0437  Weight: 103.4 kg 106.7 kg 101.3 kg    Exam:   General: Elderly male, lying comfortably in bed   Cardiovascular: Regular rate and rhythm, loud systolic murmur, loudest at apex, 1+ pitting edema bilateral lower extremities to calfs (improved prior exam)  Respiratory: Normal respiratory effort on room air, minimal crackles at bases  Abdomen: Soft, nondistended, nontender, normal bowel sounds  Musculoskeletal: improvement in range of motion of left shoulder/arm due to pain  Skin: bruising of left lateral arm  Neurologic alert oriented x4, deaf,  no appreciable focal deficits otherwise  Data Reviewed: Basic Metabolic Panel: Recent Labs  Lab 08/11/18 2000  08/13/18 0502 08/14/18 0545 08/15/18 0251  NA 140 138 136 136  K 4.5 4.1 4.1 4.2  CL 109 104 101 105  CO2 26 26 26 25   GLUCOSE 121* 97 101* 98  BUN 29* 24* 25* 25*  CREATININE 1.26* 1.43* 1.68* 1.69*  CALCIUM 8.4* 8.1* 8.2* 8.1*   Liver Function Tests: No results for input(s): AST, ALT, ALKPHOS, BILITOT, PROT, ALBUMIN in the last 168 hours. No results for input(s): LIPASE, AMYLASE in the last 168 hours. No results for input(s): AMMONIA in the last 168 hours. CBC: Recent Labs  Lab 08/14/18 0545 08/14/18 1216 08/14/18 1747 08/15/18 0251 08/15/18 1045  WBC 6.6 7.5 6.7 6.1 7.0  HGB 8.0* 7.8* 7.9* 7.8* 8.2*  HCT 26.1* 25.0* 25.2* 25.6* 27.1*  MCV 75.2* 76.0* 75.4* 75.5* 77.2*  PLT 245 232 240 219 240   Cardiac Enzymes: Recent Labs  Lab 08/11/18 2246 08/12/18 0613 08/12/18 1200 08/12/18 1752  TROPONINI 0.03* 0.03* 0.03* 0.03*   BNP (last 3 results) Recent Labs    03/14/18 1736 05/25/18 1651 08/11/18 2246  BNP 375.8* 292.5* 316.9*    ProBNP (last 3 results) No results for input(s): PROBNP in the last 8760 hours.  CBG: No results for input(s): GLUCAP in the last 168 hours.  Recent Results (from the past 240 hour(s))  SARS Coronavirus 2 (CEPHEID - Performed in [place] hospital lab), Hosp Order     Status: None   Collection Time: 08/11/18 10:46 PM  Result Value Ref Range Status   SARS Coronavirus 2 NEGATIVE NEGATIVE Final    Comment: (NOTE) If result is NEGATIVE SARS-CoV-2 target nucleic acids are NOT DETECTED. The SARS-CoV-2 RNA is generally detectable in upper and lower  respiratory specimens during the acute phase of infection. The lowest  concentration of SARS-CoV-2 viral copies this assay can detect is 250  copies / mL. A negative result does not preclude SARS-CoV-2 infection  and should not be used as the sole basis for treatment or other  patient management decisions.  A negative result may occur with  improper specimen collection / handling,  submission of specimen other  than nasopharyngeal swab, presence of viral mutation(s) within the  areas targeted by this assay, and inadequate number of viral copies  (<250 copies / mL). A negative result must be combined with clinical  observations, patient history, and epidemiological information. If result is POSITIVE SARS-CoV-2 target nucleic acids are DETECTED. The SARS-CoV-2 RNA is generally detectable in upper and lower  respiratory specimens dur ing the acute phase of infection.  Positive  results are indicative of active infection with SARS-CoV-2.  Clinical  correlation with patient history and other diagnostic information is  necessary to determine patient infection status.  Positive results do  not rule out bacterial infection or co-infection with other viruses. If result is PRESUMPTIVE POSTIVE SARS-CoV-2 nucleic acids MAY BE PRESENT.   A presumptive positive result was obtained on the submitted specimen  and confirmed on repeat testing.  While 2019 novel coronavirus  (SARS-CoV-2) nucleic acids may be present in the submitted sample  additional confirmatory testing may be necessary for epidemiological  and / or clinical management purposes  to differentiate between  SARS-CoV-2 and other Sarbecovirus currently known to infect humans.  If clinically indicated additional testing with an alternate test  methodology [phone]) is advised. The SARS-CoV-2 RNA is generally  detectable in upper and lower  respiratory sp ecimens during the acute  phase of infection. The expected result is Negative. Fact Sheet for Patients:  StrictlyIdeas.no Fact Sheet for Healthcare Providers: BankingDealers.co.za This test is not yet approved or cleared by the [place] FDA and has been authorized for detection and/or diagnosis of SARS-CoV-2 by FDA under an Emergency Use Authorization (EUA).  This EUA will remain in effect (meaning this test can be  used) for the duration of the COVID-19 declaration under Section 564(b)(1) of the Act, 21 U.S.C. section 360bbb-3(b)(1), unless the authorization is terminated or revoked sooner. Performed at [facility], [place] [address], [place],  [address]      Studies: Ct Abdomen Pelvis Wo Contrast  Result Date: 08/14/2018 CLINICAL DATA:  New hypotension with dropping hemoglobin. Evaluate for retroperitoneal bleed. Abdominal pain EXAM: CT ABDOMEN AND PELVIS WITHOUT CONTRAST TECHNIQUE: Multidetector CT imaging of the abdomen and pelvis was performed following the standard protocol without IV contrast. COMPARISON:  05/27/2018 FINDINGS: Lower chest: Cardiomegaly. Biventricular pacer leads. Moderate layering pleural effusions. Hepatobiliary: Lobulated liver could reflect cirrhosis-as previously suggested. The caudate lobe is also large.No evidence of biliary obstruction or stone. Pancreas: Stable mild fat haziness around the pancreas which is likely related to generalized volume overload given stability. Spleen: Granulomatous calcification Adrenals/Urinary Tract: Negative adrenals. No hydronephrosis or stone. Advanced right renal scarring. No acute bladder finding. Stomach/Bowel: No obstruction. No inflammatory bowel wall thickening. Vascular/Lymphatic: Extensive atherosclerotic plaque. There is been stent grafting of the abdominal aorta. Bilateral common iliac artery aneurysm measuring up to 2.2 cm diameter on the left. No retroperitoneal or other hemorrhage. Reproductive:Enlarged prostate with eccentric left-sided fullness which has been seen since at least 2017. There are prostate brachytherapy seeds. Other: No ascites or pneumoperitoneum. Musculoskeletal: Degenerative disease without acute finding. Anasarca IMPRESSION: 1. Negative for retroperitoneal hemorrhage or other specific cause of anemia. 2. No acute finding in the abdomen. 3. Moderate pleural effusions which have increased from 05/27/2018.  Electronically Signed   By: Monte Fantasia M.D.   On: 08/14/2018 15:08    Scheduled Meds: . sodium chloride   Intravenous Once  . ferrous sulfate  325 mg Oral Daily  . isosorbide mononitrate  90 mg Oral Daily  . pantoprazole  40 mg Oral Daily  . simvastatin  20 mg Oral q1800  . sodium chloride flush  3 mL Intravenous Q12H  . vitamin B-12  500 mcg Oral Daily   Continuous Infusions: . sodium chloride      Principal Problem:   Acute on chronic combined systolic and diastolic CHF (congestive heart failure) (HCC) Active Problems:   Permanent atrial fibrillation [facility])   Biventricular ICD Medtronic Viva single chamber October 2014   Benign essential HTN   Status post coronary artery bypass grafting   Bilateral deafness   Anemia   Acute on chronic systolic CHF (congestive heart failure) ([place])   Hypotension      Desiree Hane  [facility]

## 2018-08-15 NOTE — Progress Notes (Signed)
Progress Note  Patient Name: Thomas [name] Date of Encounter: 08/15/2018  Primary Cardiologist: Sanda Klein, MD   Subjective   84 yo with deafness, CHF ,  Acute on chronic CHF, anemia Seen with Thomas [name] ( sign language interpreter )   Inpatient Medications    Scheduled Meds:  sodium chloride   Intravenous Once   ferrous sulfate  325 mg Oral Daily   isosorbide mononitrate  90 mg Oral Daily   pantoprazole  40 mg Oral Daily   simvastatin  20 mg Oral q1800   sodium chloride flush  3 mL Intravenous Q12H   vitamin B-12  500 mcg Oral Daily   Continuous Infusions:  sodium chloride     PRN Meds: sodium chloride, acetaminophen, albuterol, alum & mag hydroxide-simeth, cyclobenzaprine, guaiFENesin-dextromethorphan, HYDROcodone-acetaminophen, hydrocortisone cream, loperamide, magnesium hydroxide, ondansetron (ZOFRAN) IV, polyethylene glycol, senna-docusate, sodium chloride flush   Vital Signs    Vitals:   08/14/18 2200 08/15/18 0437 08/15/18 0637 08/15/18 0700  BP: 102/63 112/67 123/73 113/60  Pulse: 60 70 66 (!) 59  Resp:  18    Temp:  98.2 F (36.8 C)    TempSrc:  Oral    SpO2:  94%    Weight:  101.3 kg    Height:        Intake/Output Summary (Last 24 hours) at 08/15/2018 0851 Last data filed at 08/15/2018 0746 Gross per 24 hour  Intake 860.12 ml  Output 1300 ml  Net -439.88 ml   Last 3 Weights 08/15/2018 08/14/2018 08/13/2018  Weight (lbs) 223 lb 4.8 oz 235 lb 3.2 oz 228 lb  Weight (kg) 101.288 kg 106.686 kg 103.42 kg      Telemetry    Atrial fib, V paced  - Personally Reviewed  ECG     - Personally Reviewed  Physical Exam   GEN: elderly male,  Generally weak.   .   Neck: No JVD Cardiac: Irreg.   9-6/2 systolic murmur radiating to axilla .  Respiratory: Clear to auscultation bilaterally. GI: Soft, nontender, non-distended  MS: 1-2 + edema on back of legs.  Neuro:  Nonfocal , deafness Psych: Normal affect   Labs    Chemistry Recent Labs    Lab 08/13/18 0502 08/14/18 0545 08/15/18 0251  NA 138 136 136  K 4.1 4.1 4.2  CL 104 101 105  CO2 26 26 25   GLUCOSE 97 101* 98  BUN 24* 25* 25*  CREATININE 1.43* 1.68* 1.69*  CALCIUM 8.1* 8.2* 8.1*  GFRNONAA 46* 38* 37*  GFRAA 53* 43* 43*  ANIONGAP 8 9 6      Hematology Recent Labs  Lab 08/14/18 1216 08/14/18 1747 08/15/18 0251  WBC 7.5 6.7 6.1  RBC 3.29* 3.34* 3.39*  HGB 7.8* 7.9* 7.8*  HCT 25.0* 25.2* 25.6*  MCV 76.0* 75.4* 75.5*  MCH 23.7* 23.7* 23.0*  MCHC 31.2 31.3 30.5  RDW 19.3* 19.3* 19.6*  PLT 232 240 219    Cardiac Enzymes Recent Labs  Lab 08/11/18 2246 08/12/18 0613 08/12/18 1200 08/12/18 1752  TROPONINI 0.03* 0.03* 0.03* 0.03*   No results for input(s): TROPIPOC in the last 168 hours.   BNP Recent Labs  Lab 08/11/18 2246  BNP 316.9*     DDimer No results for input(s): DDIMER in the last 168 hours.   Radiology    Ct Abdomen Pelvis Wo Contrast  Result Date: 08/14/2018 CLINICAL DATA:  New hypotension with dropping hemoglobin. Evaluate for retroperitoneal bleed. Abdominal pain EXAM: CT ABDOMEN AND PELVIS  WITHOUT CONTRAST TECHNIQUE: Multidetector CT imaging of the abdomen and pelvis was performed following the standard protocol without IV contrast. COMPARISON:  05/27/2018 FINDINGS: Lower chest: Cardiomegaly. Biventricular pacer leads. Moderate layering pleural effusions. Hepatobiliary: Lobulated liver could reflect cirrhosis-as previously suggested. The caudate lobe is also large.No evidence of biliary obstruction or stone. Pancreas: Stable mild fat haziness around the pancreas which is likely related to generalized volume overload given stability. Spleen: Granulomatous calcification Adrenals/Urinary Tract: Negative adrenals. No hydronephrosis or stone. Advanced right renal scarring. No acute bladder finding. Stomach/Bowel: No obstruction. No inflammatory bowel wall thickening. Vascular/Lymphatic: Extensive atherosclerotic plaque. There is been stent  grafting of the abdominal aorta. Bilateral common iliac artery aneurysm measuring up to 2.2 cm diameter on the left. No retroperitoneal or other hemorrhage. Reproductive:Enlarged prostate with eccentric left-sided fullness which has been seen since at least 2017. There are prostate brachytherapy seeds. Other: No ascites or pneumoperitoneum. Musculoskeletal: Degenerative disease without acute finding. Anasarca IMPRESSION: 1. Negative for retroperitoneal hemorrhage or other specific cause of anemia. 2. No acute finding in the abdomen. 3. Moderate pleural effusions which have increased from 05/27/2018. Electronically Signed   By: Monte Fantasia M.D.   On: 08/14/2018 15:08    Cardiac Studies      Patient Profile     84 year old male with past medical history of coronary artery disease status post prior coronary artery bypass graft, ischemic cardiomyopathy, chronic combined systolic/diastolic congestive heart failure, paroxysmal atrial fibrillation, hypertension, chronic stage III kidney disease, prostate cancer, deafness for evaluation of acute on chronic combined systolic/diastolic congestive heart failure.   Assessment & Plan    1. Acute on Chronic Combined Systolic and Diastolic CHF: Lasix is currently on hold due to his hypotensive episode.   Breathing better.   Still has volume excess. Consider restarting lasix tomorrow    2.  Atrial fib :  HR is well controlled.  eliquis is currently on hold due to his anemia / falling Hb.   3.  CAD - no angina   For questions or updates, please contact [place] Please consult [email or web address] for contact info under        Signed, Lyda Jester, PA-C  08/15/2018, 8:51 AM

## 2018-08-15 NOTE — Progress Notes (Signed)
BP has been better after 2nd bolus. No complaints of being dizzy or lightheaded.

## 2018-08-15 NOTE — Plan of Care (Signed)
  Problem: Education: Goal: Ability to demonstrate management of disease process will improve Outcome: Adequate for Discharge   Problem: Education: Goal: Ability to verbalize understanding of medication therapies will improve Outcome: Adequate for Discharge   Problem: Activity: Goal: Capacity to carry out activities will improve Outcome: Adequate for Discharge   

## 2018-08-15 NOTE — Progress Notes (Signed)
Physical Therapy Treatment Patient Details Name: Thomas [name] MRN: [id number] DOB: 1934-12-24 Today's Date: 08/15/2018    History of Present Illness COLTON TASSIN is a 84 y.o. male with PMH including CHF,CABG in 1995, AICD / Biventricular PPM placement, A.Fib, HTN. Patient presented to the ED with c/o generalized weakness, increased BLE edema, increased SOB, 2 falls. CHF    PT Comments    Pt, wife and interpreter [name], all present throughout session. Wife frequently interrupting pt to speak to interpreter and both perseverating at times on his fall. Son lives with them but works 11a-11p and they report he cannot assist to help pt dress or transfer OOB prior to work. However, pt does have a recliner at home and is agreeable that he could sleep in the recliner to assist with getting up and down since bed mobility is pt's biggest struggle. Wife clearly cannot assist at home as she has trouble even standing in room. Pt may be able to return home if he can safely demonstrate multiple transfers from chair level and increased gait.     Follow Up Recommendations  SNF;Home health PT;Supervision/Assistance - 24 hour     Equipment Recommendations  Rolling walker with 5" wheels    Recommendations for Other Services       Precautions / Restrictions Precautions Precautions: Fall    Mobility  Bed Mobility Overal bed mobility: Needs Assistance Bed Mobility: Supine to Sit     Supine to sit: Mod assist     General bed mobility comments: pt unable to roll to side to push or use UE to pull with hand held assist to elevate trunk due to shoulder pain. Assist to pivot legs to EOB and elevate trunk from under shoulder with bed flat and no rail  Transfers Overall transfer level: Needs assistance   Transfers: Sit to/from Stand Sit to Stand: Min assist         General transfer comment: min assist to stand from bed with decreased use of bil Ue to push off surface due to  pain  Ambulation/Gait Ambulation/Gait assistance: Min guard Gait Distance (Feet): 275 Feet Assistive device: Rolling walker (2 wheeled) Gait Pattern/deviations: Step-through pattern;Decreased stride length;Trunk flexed   Gait velocity interpretation: >2.62 ft/sec, indicative of community ambulatory General Gait Details: cues for posture and position in RW   Stairs             Wheelchair Mobility    Modified Rankin (Stroke Patients Only)       Balance Overall balance assessment: History of Falls                                          Cognition Arousal/Alertness: Awake/alert Behavior During Therapy: WFL for tasks assessed/performed Overall Cognitive Status: Impaired/Different from baseline Area of Impairment: Memory;Problem solving                     Memory: Decreased short-term memory         General Comments: pt frequently interrupting interpreter and not answering question asked but repeatedly stating he fell      Exercises General Exercises - Lower Extremity Long Arc Quad: AROM;15 reps;Seated;Both Hip Flexion/Marching: AROM;15 reps;Seated;Both    General Comments        Pertinent Vitals/Pain Pain Score: 6  Pain Location: Bil shoulder Pain Descriptors / Indicators: Aching;Discomfort;Grimacing;Guarding Pain Intervention(s): Limited activity within patient's tolerance;Monitored  during session;Repositioned    Home Living                      Prior Function            PT Goals (current goals can now be found in the care plan section) Progress towards PT goals: Progressing toward goals    Frequency           PT Plan Current plan remains appropriate    Co-evaluation              AM-PAC PT "6 Clicks" Mobility   Outcome Measure  Help needed turning from your back to your side while in a flat bed without using bedrails?: A Little Help needed moving from lying on your back to sitting on the side  of a flat bed without using bedrails?: A Lot Help needed moving to and from a bed to a chair (including a wheelchair)?: A Little Help needed standing up from a chair using your arms (e.g., wheelchair or bedside chair)?: A Little Help needed to walk in hospital room?: A Little Help needed climbing 3-5 steps with a railing? : A Little 6 Click Score: 17    End of Session Equipment Utilized During Treatment: Gait belt Activity Tolerance: Patient tolerated treatment well Patient left: in chair;with call bell/phone within reach;with chair alarm set;with family/visitor present Nurse Communication: Mobility status PT Visit Diagnosis: Unsteadiness on feet (R26.81);Other abnormalities of gait and mobility (R26.89);Muscle weakness (generalized) (M62.81);History of falling (Z91.81)     Time: 9528-4132 PT Time Calculation (min) (ACUTE ONLY): 33 min  Charges:  $Gait Training: 8-22 mins $Therapeutic Exercise: 8-22 mins                     Yennifer Segovia Pam Drown, PT Acute Rehabilitation Services Pager: [phone] Office: [phone]    Sandy Salaam Alexius Ellington 08/15/2018, 12:22 PM

## 2018-08-15 NOTE — Progress Notes (Signed)
Patient alarmed 8 beats vtach. Patient asymptomatic and sleeping in the chair. Will continue to monitor.

## 2018-08-16 ENCOUNTER — Inpatient Hospital Stay (HOSPITAL_COMMUNITY): Payer: PPO

## 2018-08-16 DIAGNOSIS — J90 Pleural effusion, not elsewhere classified: Secondary | ICD-10-CM

## 2018-08-16 DIAGNOSIS — I509 Heart failure, unspecified: Secondary | ICD-10-CM

## 2018-08-16 LAB — BASIC METABOLIC PANEL
Anion gap: 9 (ref 5–15)
BUN: 21 mg/dL (ref 8–23)
CO2: 24 mmol/L (ref 22–32)
Calcium: 8.2 mg/dL — ABNORMAL LOW (ref 8.9–10.3)
Chloride: 105 mmol/L (ref 98–111)
Creatinine, Ser: 1.41 mg/dL — ABNORMAL HIGH (ref 0.61–1.24)
GFR calc Af Amer: 54 mL/min — ABNORMAL LOW (ref >60)
GFR calc non Af Amer: 46 mL/min — ABNORMAL LOW (ref >60)
Glucose, Bld: 95 mg/dL (ref 70–99)
Potassium: 4.1 mmol/L (ref 3.5–5.1)
Sodium: 138 mmol/L (ref 135–145)

## 2018-08-16 LAB — CBC
HCT: 25.8 % — ABNORMAL LOW (ref 39.0–52.0)
Hemoglobin: 7.9 g/dL — ABNORMAL LOW (ref 13.0–17.0)
MCH: 23.7 pg — ABNORMAL LOW (ref 26.0–34.0)
MCHC: 30.6 g/dL (ref 30.0–36.0)
MCV: 77.2 fL — ABNORMAL LOW (ref 80.0–100.0)
Platelets: 212 10*3/uL (ref 150–400)
RBC: 3.34 MIL/uL — ABNORMAL LOW (ref 4.22–5.81)
RDW: 19.9 % — ABNORMAL HIGH (ref 11.5–15.5)
WBC: 5.6 10*3/uL (ref 4.0–10.5)
nRBC: 0 % (ref 0.0–0.2)

## 2018-08-16 MED ORDER — CARVEDILOL 3.125 MG PO TABS
3.1250 mg | ORAL_TABLET | Freq: Two times a day (BID) | ORAL | Status: DC
  Administered 2018-08-16 – 2018-08-17 (×2): 3.125 mg via ORAL
  Filled 2018-08-16 (×2): qty 1

## 2018-08-16 MED ORDER — LOSARTAN POTASSIUM 25 MG PO TABS
25.0000 mg | ORAL_TABLET | Freq: Every day | ORAL | Status: DC
  Administered 2018-08-16 – 2018-08-17 (×2): 25 mg via ORAL
  Filled 2018-08-16 (×2): qty 1

## 2018-08-16 MED ORDER — FUROSEMIDE 40 MG PO TABS
40.0000 mg | ORAL_TABLET | Freq: Every day | ORAL | Status: DC
  Administered 2018-08-16 – 2018-08-17 (×2): 40 mg via ORAL
  Filled 2018-08-16 (×2): qty 1

## 2018-08-16 NOTE — Progress Notes (Signed)
Physical Therapy Treatment Patient Details Name: Thomas [name] MRN: [id number] DOB: [birth year] Today's Date: 08/16/2018    History of Present Illness Thomas [name] is a 84 y.o. male with PMH including CHF,CABG in 1995, AICD / Biventricular PPM placement, A.Fib, HTN. Patient presented to the ED with c/o generalized weakness, increased BLE edema, increased SOB, 2 falls. CHF    PT Comments    Pt very pleasant and moving much better with all transfers and gait today.Interpreter Thomas [name] present throughout session along with wife. Pt able to get out of bed without rails without report of pain and no assist today. Pt with increased ambulation distance without RW and states he feels well without SOB. Pt and wife agreeable to D/C home with home safety assessment to address potential fall risk. Encouraged mobility with staff acutely.   HR 72-84 with activity    Follow Up Recommendations  Home health PT     Equipment Recommendations  None recommended by PT    Recommendations for Other Services       Precautions / Restrictions Precautions Precautions: Fall    Mobility  Bed Mobility Overal bed mobility: Modified Independent Bed Mobility: Supine to Sit;Sit to Supine           General bed mobility comments: pt able to transition to supine and back to sitting with bed flat and no rails today  Transfers Overall transfer level: Needs assistance   Transfers: Sit to/from Stand Sit to Stand: Supervision         General transfer comment: cues for hand placement x 3 trials from bed and chair  Ambulation/Gait Ambulation/Gait assistance: Supervision Gait Distance (Feet): 350 Feet Assistive device: Rolling walker (2 wheeled);None Gait Pattern/deviations: WFL(Within Functional Limits)   Gait velocity interpretation: >2.62 ft/sec, indicative of community ambulatory General Gait Details: pt walked 62' with RW then able to let go of it and walk remainder of distance without DME  with good posture and stability   Stairs             Wheelchair Mobility    Modified Rankin (Stroke Patients Only)       Balance Overall balance assessment: History of Falls                                          Cognition Arousal/Alertness: Awake/alert Behavior During Therapy: WFL for tasks assessed/performed Overall Cognitive Status: Within Functional Limits for tasks assessed                                        Exercises General Exercises - Lower Extremity Long Arc Quad: AROM;Seated;Both;20 reps Hip Flexion/Marching: AROM;Seated;Both;20 reps    General Comments        Pertinent Vitals/Pain Pain Assessment: 0-10 Pain Score: 3  Pain Location: Bil shoulder Pain Descriptors / Indicators: Aching;Sore Pain Intervention(s): Limited activity within patient's tolerance;Repositioned    Home Living                      Prior Function            PT Goals (current goals can now be found in the care plan section) Progress towards PT goals: Progressing toward goals    Frequency           PT  Plan Current plan remains appropriate    Co-evaluation              AM-PAC PT "6 Clicks" Mobility   Outcome Measure  Help needed turning from your back to your side while in a flat bed without using bedrails?: None Help needed moving from lying on your back to sitting on the side of a flat bed without using bedrails?: None Help needed moving to and from a bed to a chair (including a wheelchair)?: A Little Help needed standing up from a chair using your arms (e.g., wheelchair or bedside chair)?: A Little Help needed to walk in hospital room?: A Little Help needed climbing 3-5 steps with a railing? : A Little 6 Click Score: 20    End of Session Equipment Utilized During Treatment: Gait belt Activity Tolerance: Patient tolerated treatment well Patient left: in chair;with call bell/phone within reach;with chair  alarm set;with family/visitor present Nurse Communication: Mobility status PT Visit Diagnosis: Other abnormalities of gait and mobility (R26.89);History of falling (Z91.81)     Time: 4332-9518 PT Time Calculation (min) (ACUTE ONLY): 21 min  Charges:  $Gait Training: 8-22 mins                     Thomas [name], PT Acute Rehabilitation Services Pager: [phone] Office: [phone]    Thomas [name] 08/16/2018, 9:45 AM

## 2018-08-16 NOTE — Progress Notes (Signed)
[facility] PROGRESS NOTE  Thomas [name] HWE:[id number] DOB: 09/30/1934 DOA: 08/11/2018 PCP: [name], MD  Assessment/Plan: 1. Hypotension.  Suspect either overdiuresis or slow GI bleed.  SBP's stable and much better than yesterday.  Hemoglobin stable and no active signs of blood loss will resume coreg 3.125 mg BID, losartan 25 mg, and oral lasix 40 mg, close BP monitoring.    2. Acute on chronic Microcytic anemia secondary to iron deficient anemia.  Recent admission in March for symptomatic anemia related to erythematous to adenopathy.  Hypotensive episode with poor hemoglobin response to 1 unit on 6/1concerning for potential GI bleed, however, hemoglobin stable , without melena and improved BP. Will remain off Eliquis, plan for outpatient endoscopic evaluation by GI at [facility] in June.   3. Acute on chronic ischemic CHF with combined systolic/diastolic exacerbation (TTE, 2/20: EF 35- 40%).  Weights inconsistent, negative 7 L this admission with IV lasix, CXR with some pulmonary edema and small pleural effusions. Resume coreg, losartan, and oral lasix today and closely monitor BP and fluid status, cardiology following. Discuss with Dr. Sallyanne Kuster in follow up about restarting eliquis  4. Reported falls x2 prior to admission.  Orthostatics negative on admission.  No findings on ICD interrogation.  X-ray left arm/ribs negative for acute fractures. Improved ambulation with PT.  Holding eliquis. Pain management with PRN Tylenol, Flexeril, Norco,   5. Paroxysmal atrial fibrillation, ventricular paced rhythm on telemetry, continue Coreg, holding Eliquis while monitoring for any bleed/drop in hemoglobin  6. HTN.  SBP better with no hypotension n last 24-48 hours, resuming home BB, Acei. Monitor.   7. CAD status post CABG (remote history), Asymptomatic  Continue home Imdur, Zocor.  8. CKD stage III stable at baseline (1.2-1.7), avoid nephrotoxins, monitor BMP, monitor  output.  9. Hyperlipidemia. continue statin   Code Status: FULL CODE Family Communication: Wife updated with ASL interpreter at bedside (indicate person spoken with, relationship, and if by phone, the number) Disposition Plan: resume oral lasix monitor blood pressure and volume status, holding eliquis due to hgb and falls, monitor CBC, possible dc in 24-48 hours   Consultants:  Cardiology  Procedures:  none  Antibiotics:  none (indicate start date, and stop date if known)  HPI/Subjective:  Thomas [name] is a 84 y.o. year old male with medical history significant for CHF/ischemic cardiomyopathy status post biventricular ICD, paroxysmal atrial fibrillation on Eliquis, HTN, CKD stage III, CAD status post CABG in 1986, chronic anemia, deafness, and recent admission from 3/11-3/14 2020 symptomatic anemia secondary to erythematous throughout the base of EGDwho presented on 08/11/2018 with worsening lower leg swelling, falls x2 and dyspnea and was found to have acute on chronic combined systolic/diastolic CHF exacerbation.   Feeling well No dyspnea Feels swelling better  Objective: Vitals:   08/16/18 0942 08/16/18 1125  BP:  114/66  Pulse: 72 63  Resp:  20  Temp:    SpO2:  96%    Intake/Output Summary (Last 24 hours) at 08/16/2018 1924 Last data filed at 08/16/2018 1840 Gross per 24 hour  Intake 1320 ml  Output 3050 ml  Net -1730 ml   Filed Weights   08/14/18 0504 08/15/18 0437 08/16/18 0500  Weight: 106.7 kg 101.3 kg 100.6 kg    Exam:   General: Elderly male, lying comfortably in bed   Cardiovascular: Regular rate and rhythm, loud systolic murmur, loudest at apex, no pitting edema, compression stockings in place  Respiratory: Normal respiratory effort on room air, minimal crackles at  bases  Abdomen: Soft, nondistended, nontender, normal bowel sounds  Musculoskeletal: improvement in range of motion of left shoulder/arm due to pain  Skin: bruising of left lateral  arm  Neurologic alert oriented x4, deaf, no appreciable focal deficits otherwise  Data Reviewed: Basic Metabolic Panel: Recent Labs  Lab 08/11/18 2000 08/13/18 0502 08/14/18 0545 08/15/18 0251 08/16/18 0537  NA 140 138 136 136 138  K 4.5 4.1 4.1 4.2 4.1  CL 109 104 101 105 105  CO2 26 26 26 25 24   GLUCOSE 121* 97 101* 98 95  BUN 29* 24* 25* 25* 21  CREATININE 1.26* 1.43* 1.68* 1.69* 1.41*  CALCIUM 8.4* 8.1* 8.2* 8.1* 8.2*   Liver Function Tests: No results for input(s): AST, ALT, ALKPHOS, BILITOT, PROT, ALBUMIN in the last 168 hours. No results for input(s): LIPASE, AMYLASE in the last 168 hours. No results for input(s): AMMONIA in the last 168 hours. CBC: Recent Labs  Lab 08/14/18 1216 08/14/18 1747 08/15/18 0251 08/15/18 1045 08/16/18 0537  WBC 7.5 6.7 6.1 7.0 5.6  HGB 7.8* 7.9* 7.8* 8.2* 7.9*  HCT 25.0* 25.2* 25.6* 27.1* 25.8*  MCV 76.0* 75.4* 75.5* 77.2* 77.2*  PLT 232 240 219 240 212   Cardiac Enzymes: Recent Labs  Lab 08/11/18 2246 08/12/18 0613 08/12/18 1200 08/12/18 1752  TROPONINI 0.03* 0.03* 0.03* 0.03*   BNP (last 3 results) Recent Labs    03/14/18 1736 05/25/18 1651 08/11/18 2246  BNP 375.8* 292.5* 316.9*    ProBNP (last 3 results) No results for input(s): PROBNP in the last 8760 hours.  CBG: No results for input(s): GLUCAP in the last 168 hours.  Recent Results (from the past 240 hour(s))  SARS Coronavirus 2 (CEPHEID - Performed in [place] hospital lab), Hosp Order     Status: None   Collection Time: 08/11/18 10:46 PM  Result Value Ref Range Status   SARS Coronavirus 2 NEGATIVE NEGATIVE Final    Comment: (NOTE) If result is NEGATIVE SARS-CoV-2 target nucleic acids are NOT DETECTED. The SARS-CoV-2 RNA is generally detectable in upper and lower  respiratory specimens during the acute phase of infection. The lowest  concentration of SARS-CoV-2 viral copies this assay can detect is 250  copies / mL. A negative result does not  preclude SARS-CoV-2 infection  and should not be used as the sole basis for treatment or other  patient management decisions.  A negative result may occur with  improper specimen collection / handling, submission of specimen other  than nasopharyngeal swab, presence of viral mutation(s) within the  areas targeted by this assay, and inadequate number of viral copies  (<250 copies / mL). A negative result must be combined with clinical  observations, patient history, and epidemiological information. If result is POSITIVE SARS-CoV-2 target nucleic acids are DETECTED. The SARS-CoV-2 RNA is generally detectable in upper and lower  respiratory specimens dur ing the acute phase of infection.  Positive  results are indicative of active infection with SARS-CoV-2.  Clinical  correlation with patient history and other diagnostic information is  necessary to determine patient infection status.  Positive results do  not rule out bacterial infection or co-infection with other viruses. If result is PRESUMPTIVE POSTIVE SARS-CoV-2 nucleic acids MAY BE PRESENT.   A presumptive positive result was obtained on the submitted specimen  and confirmed on repeat testing.  While 2019 novel coronavirus  (SARS-CoV-2) nucleic acids may be present in the submitted sample  additional confirmatory testing may be necessary for epidemiological  and /  or clinical management purposes  to differentiate between  SARS-CoV-2 and other Sarbecovirus currently known to infect humans.  If clinically indicated additional testing with an alternate test  methodology [phone]) is advised. The SARS-CoV-2 RNA is generally  detectable in upper and lower respiratory sp ecimens during the acute  phase of infection. The expected result is Negative. Fact Sheet for Patients:  StrictlyIdeas.no Fact Sheet for Healthcare Providers: BankingDealers.co.za This test is not yet approved or cleared by  the [place] FDA and has been authorized for detection and/or diagnosis of SARS-CoV-2 by FDA under an Emergency Use Authorization (EUA).  This EUA will remain in effect (meaning this test can be used) for the duration of the COVID-19 declaration under Section 564(b)(1) of the Act, 21 U.S.C. section 360bbb-3(b)(1), unless the authorization is terminated or revoked sooner. Performed at [facility], [place] [address] [place], [place] [address]      Studies: Dg Chest 2 View  Result Date: 08/16/2018 CLINICAL DATA:  Shortness of breath.  Pleural effusions. EXAM: CHEST - 2 VIEW COMPARISON:  Single-view of the chest 08/12/2018. PA and lateral chest 05/25/2018. FINDINGS: There is cardiomegaly and pulmonary edema. Associated small bilateral pleural effusions are seen. Aortic atherosclerosis is noted. AICD/pacing device is unchanged. IMPRESSION: Congestive heart failure with associated small pleural effusions. Electronically Signed   By: Inge Rise M.D.   On: 08/16/2018 12:57    Scheduled Meds: . sodium chloride   Intravenous Once  . carvedilol  3.125 mg Oral BID WC  . ferrous sulfate  325 mg Oral Daily  . furosemide  40 mg Oral Daily  . isosorbide mononitrate  90 mg Oral Daily  . losartan  25 mg Oral Daily  . pantoprazole  40 mg Oral Daily  . simvastatin  20 mg Oral q1800  . sodium chloride flush  3 mL Intravenous Q12H  . vitamin B-12  500 mcg Oral Daily   Continuous Infusions: . sodium chloride      Principal Problem:   Acute on chronic combined systolic and diastolic CHF (congestive heart failure) (HCC) Active Problems:   Permanent atrial fibrillation [facility])   Biventricular ICD Medtronic Viva single chamber October 2014   Benign essential HTN   Status post coronary artery bypass grafting   Bilateral deafness   Anemia   Acute on chronic systolic CHF (congestive heart failure) ([place])   Hypotension      Desiree Hane  [facility]

## 2018-08-16 NOTE — Progress Notes (Addendum)
Progress Note  Patient Name: Thomas [name] Date of Encounter: 08/16/2018  Primary Cardiologist: Sanda Klein, MD   Subjective   84 yo with deafness, CHF ,  Acute on chronic CHF, anemia Seen with [name] ( sign language interpreter )   Has diuresed 6.2 liters so far this admission   Inpatient Medications    Scheduled Meds: . sodium chloride   Intravenous Once  . ferrous sulfate  325 mg Oral Daily  . isosorbide mononitrate  90 mg Oral Daily  . pantoprazole  40 mg Oral Daily  . simvastatin  20 mg Oral q1800  . sodium chloride flush  3 mL Intravenous Q12H  . vitamin B-12  500 mcg Oral Daily   Continuous Infusions: . sodium chloride     PRN Meds: sodium chloride, acetaminophen, albuterol, alum & mag hydroxide-simeth, cyclobenzaprine, guaiFENesin-dextromethorphan, HYDROcodone-acetaminophen, hydrocortisone cream, loperamide, magnesium hydroxide, ondansetron (ZOFRAN) IV, polyethylene glycol, senna-docusate, sodium chloride flush   Vital Signs    Vitals:   08/16/18 0458 08/16/18 0500 08/16/18 0912 08/16/18 0942  BP: 112/65  119/63   Pulse: 61  66 72  Resp:   16   Temp: 98 F (36.7 C)  (!) 97.5 F (36.4 C)   TempSrc: Oral  Oral   SpO2: 94%  96%   Weight:  100.6 kg    Height:        Intake/Output Summary (Last 24 hours) at 08/16/2018 1013 Last data filed at 08/16/2018 0856 Gross per 24 hour  Intake 960 ml  Output 1925 ml  Net -965 ml   Last 3 Weights 08/16/2018 08/15/2018 08/14/2018  Weight (lbs) 221 lb 11.2 oz 223 lb 4.8 oz 235 lb 3.2 oz  Weight (kg) 100.562 kg 101.288 kg 106.686 kg      Telemetry    Atrial fib, V paced  - Personally Reviewed  ECG     - Personally Reviewed  Physical Exam   Physical Exam: Blood pressure 119/63, pulse 72, temperature (!) 97.5 F (36.4 C), temperature source Oral, resp. rate 16, height 6\' 2"  (1.88 m), weight 100.6 kg, SpO2 96 %.  GEN:  Elderly male,   HEENT: Normal NECK: No JVD; No carotid bruits LYMPHATICS: No  lymphadenopathy CARDIAC: RRR  RESPIRATORY:  Basilar rales.  ABDOMEN: Soft, non-tender, non-distended MUSCULOSKELETAL:  No edema; No deformity  SKIN: Warm and dry NEUROLOGIC:  Alert and oriented x 3   Labs    Chemistry Recent Labs  Lab 08/14/18 0545 08/15/18 0251 08/16/18 0537  NA 136 136 138  K 4.1 4.2 4.1  CL 101 105 105  CO2 26 25 24   GLUCOSE 101* 98 95  BUN 25* 25* 21  CREATININE 1.68* 1.69* 1.41*  CALCIUM 8.2* 8.1* 8.2*  GFRNONAA 38* 37* 46*  GFRAA 43* 43* 54*  ANIONGAP 9 6 9      Hematology Recent Labs  Lab 08/15/18 0251 08/15/18 1045 08/16/18 0537  WBC 6.1 7.0 5.6  RBC 3.39* 3.51* 3.34*  HGB 7.8* 8.2* 7.9*  HCT 25.6* 27.1* 25.8*  MCV 75.5* 77.2* 77.2*  MCH 23.0* 23.4* 23.7*  MCHC 30.5 30.3 30.6  RDW 19.6* 19.9* 19.9*  PLT 219 240 212    Cardiac Enzymes Recent Labs  Lab 08/11/18 2246 08/12/18 0613 08/12/18 1200 08/12/18 1752  TROPONINI 0.03* 0.03* 0.03* 0.03*   No results for input(s): TROPIPOC in the last 168 hours.   BNP Recent Labs  Lab 08/11/18 2246  BNP 316.9*     DDimer No results for input(s): DDIMER  in the last 168 hours.   Radiology    Ct Abdomen Pelvis Wo Contrast  Result Date: 08/14/2018 CLINICAL DATA:  New hypotension with dropping hemoglobin. Evaluate for retroperitoneal bleed. Abdominal pain EXAM: CT ABDOMEN AND PELVIS WITHOUT CONTRAST TECHNIQUE: Multidetector CT imaging of the abdomen and pelvis was performed following the standard protocol without IV contrast. COMPARISON:  05/27/2018 FINDINGS: Lower chest: Cardiomegaly. Biventricular pacer leads. Moderate layering pleural effusions. Hepatobiliary: Lobulated liver could reflect cirrhosis-as previously suggested. The caudate lobe is also large.No evidence of biliary obstruction or stone. Pancreas: Stable mild fat haziness around the pancreas which is likely related to generalized volume overload given stability. Spleen: Granulomatous calcification Adrenals/Urinary Tract: Negative  adrenals. No hydronephrosis or stone. Advanced right renal scarring. No acute bladder finding. Stomach/Bowel: No obstruction. No inflammatory bowel wall thickening. Vascular/Lymphatic: Extensive atherosclerotic plaque. There is been stent grafting of the abdominal aorta. Bilateral common iliac artery aneurysm measuring up to 2.2 cm diameter on the left. No retroperitoneal or other hemorrhage. Reproductive:Enlarged prostate with eccentric left-sided fullness which has been seen since at least 2017. There are prostate brachytherapy seeds. Other: No ascites or pneumoperitoneum. Musculoskeletal: Degenerative disease without acute finding. Anasarca IMPRESSION: 1. Negative for retroperitoneal hemorrhage or other specific cause of anemia. 2. No acute finding in the abdomen. 3. Moderate pleural effusions which have increased from 05/27/2018. Electronically Signed   By: Monte Fantasia M.D.   On: 08/14/2018 15:08    Cardiac Studies      Patient Profile     84 year old male with past medical history of coronary artery disease status post prior coronary artery bypass graft, ischemic cardiomyopathy, chronic combined systolic/diastolic congestive heart failure, paroxysmal atrial fibrillation, hypertension, chronic stage III kidney disease, prostate cancer, deafness for evaluation of acute on chronic combined systolic/diastolic congestive heart failure.   Assessment & Plan    1. Acute on Chronic Combined Systolic and Diastolic CHF:  breathing is much better.    Will restart on Lasix 40 mg a day  Losartan 25 mg a day ,  Coreg 3.125 mg BID ( roughly 1/2 of previous doses)   He was hypotensive after vigorous diuresis and BP meds were held.  Will restart   2.  Atrial fib :  HR is well controlled.   will be restarting coreg at 3.125 BID He was on Eliquis  but this has been stopped due to GI bleeding and risk of falls.  Will follow up with GI and with Dr. Sallyanne Kuster in the near future to decide if Eliquis should be  restarted   3.  CAD -   No angina    For questions or updates, please contact [place] Please consult [email or web address] for contact info under        Signed, Mertie Moores, MD  08/16/2018, 10:13 AM

## 2018-08-17 LAB — CBC
HCT: 25.9 % — ABNORMAL LOW (ref 39.0–52.0)
Hemoglobin: 7.8 g/dL — ABNORMAL LOW (ref 13.0–17.0)
MCH: 23.4 pg — ABNORMAL LOW (ref 26.0–34.0)
MCHC: 30.1 g/dL (ref 30.0–36.0)
MCV: 77.5 fL — ABNORMAL LOW (ref 80.0–100.0)
Platelets: 195 10*3/uL (ref 150–400)
RBC: 3.34 MIL/uL — ABNORMAL LOW (ref 4.22–5.81)
RDW: 20.2 % — ABNORMAL HIGH (ref 11.5–15.5)
WBC: 5.8 10*3/uL (ref 4.0–10.5)
nRBC: 0 % (ref 0.0–0.2)

## 2018-08-17 LAB — BASIC METABOLIC PANEL
Anion gap: 10 (ref 5–15)
BUN: 21 mg/dL (ref 8–23)
CO2: 24 mmol/L (ref 22–32)
Calcium: 8.3 mg/dL — ABNORMAL LOW (ref 8.9–10.3)
Chloride: 106 mmol/L (ref 98–111)
Creatinine, Ser: 1.43 mg/dL — ABNORMAL HIGH (ref 0.61–1.24)
GFR calc Af Amer: 53 mL/min — ABNORMAL LOW (ref >60)
GFR calc non Af Amer: 46 mL/min — ABNORMAL LOW (ref >60)
Glucose, Bld: 95 mg/dL (ref 70–99)
Potassium: 4.2 mmol/L (ref 3.5–5.1)
Sodium: 140 mmol/L (ref 135–145)

## 2018-08-17 MED ORDER — ISOSORBIDE MONONITRATE ER 30 MG PO TB24
30.0000 mg | ORAL_TABLET | Freq: Every day | ORAL | Status: DC
  Administered 2018-08-17: 30 mg via ORAL
  Filled 2018-08-17: qty 1

## 2018-08-17 MED ORDER — LOSARTAN POTASSIUM 25 MG PO TABS: 25.0000 mg | ORAL_TABLET | Freq: Every day | ORAL | 0 refills | Status: DC

## 2018-08-17 MED ORDER — CARVEDILOL 3.125 MG PO TABS: 3.1250 mg | ORAL_TABLET | Freq: Two times a day (BID) | ORAL | 0 refills | Status: DC

## 2018-08-17 MED ORDER — ISOSORBIDE MONONITRATE ER 30 MG PO TB24: 30.0000 mg | ORAL_TABLET | Freq: Every day | ORAL | 0 refills | Status: DC

## 2018-08-17 NOTE — Progress Notes (Signed)
Progress Note  Patient Name: Thomas [name] Date of Encounter: 08/17/2018  Primary Cardiologist: Sanda Klein, MD   Subjective   84 yo with deafness, CHF ,  Acute on chronic CHF, anemia Seen with Odis Hollingshead ( sign language interpreter )   Has diuresed 7.5  liters so far this admission Feeling well .  Has walked the halls   Inpatient Medications    Scheduled Meds: . sodium chloride   Intravenous Once  . carvedilol  3.125 mg Oral BID WC  . ferrous sulfate  325 mg Oral Daily  . furosemide  40 mg Oral Daily  . isosorbide mononitrate  30 mg Oral Daily  . losartan  25 mg Oral Daily  . pantoprazole  40 mg Oral Daily  . simvastatin  20 mg Oral q1800  . sodium chloride flush  3 mL Intravenous Q12H  . vitamin B-12  500 mcg Oral Daily   Continuous Infusions: . sodium chloride     PRN Meds: sodium chloride, acetaminophen, albuterol, alum & mag hydroxide-simeth, cyclobenzaprine, guaiFENesin-dextromethorphan, HYDROcodone-acetaminophen, hydrocortisone cream, loperamide, magnesium hydroxide, ondansetron (ZOFRAN) IV, polyethylene glycol, senna-docusate, sodium chloride flush   Vital Signs    Vitals:   08/16/18 1958 08/17/18 0018 08/17/18 0513 08/17/18 0947  BP: 112/64 (!) 108/57 123/66 124/63  Pulse: 61 60 63 78  Resp: 18 18 20    Temp: 98.2 F (36.8 C) 98.4 F (36.9 C) 97.8 F (36.6 C)   TempSrc: Oral Oral Oral   SpO2: 93% 94% 93% 98%  Weight:   100.3 kg   Height:        Intake/Output Summary (Last 24 hours) at 08/17/2018 0959 Last data filed at 08/17/2018 0518 Gross per 24 hour  Intake 960 ml  Output 2350 ml  Net -1390 ml   Last 3 Weights 08/17/2018 08/16/2018 08/15/2018  Weight (lbs) 221 lb 3.2 oz 221 lb 11.2 oz 223 lb 4.8 oz  Weight (kg) 100.336 kg 100.562 kg 101.288 kg      Telemetry    Atrial fib, V paced  - Personally Reviewed  ECG     - Personally Reviewed  Physical Exam   Physical Exam: Blood pressure 124/63, pulse 78, temperature 97.8 F (36.6 C),  temperature source Oral, resp. rate 20, height 6\' 2"  (1.88 m), weight 100.3 kg, SpO2 98 %.  GEN:  Elderly male,   HEENT: Normal NECK: no JVD, no bruits  LYMPHATICS: No lymphadenopathy CARDIAC:  Irreg. Irreg. , 2/6 systolic murmur  RESPIRATORY:  Lungs sound better  ABDOMEN: obese, NT  MUSCULOSKELETAL:   1+ leg edema  SKIN: Warm and dry NEUROLOGIC:  Alert and oriented x 3.  Deafness.    Labs    Chemistry Recent Labs  Lab 08/15/18 0251 08/16/18 0537 08/17/18 0453  NA 136 138 140  K 4.2 4.1 4.2  CL 105 105 106  CO2 25 24 24   GLUCOSE 98 95 95  BUN 25* 21 21  CREATININE 1.69* 1.41* 1.43*  CALCIUM 8.1* 8.2* 8.3*  GFRNONAA 37* 46* 46*  GFRAA 43* 54* 53*  ANIONGAP 6 9 10      Hematology Recent Labs  Lab 08/15/18 1045 08/16/18 0537 08/17/18 0453  WBC 7.0 5.6 5.8  RBC 3.51* 3.34* 3.34*  HGB 8.2* 7.9* 7.8*  HCT 27.1* 25.8* 25.9*  MCV 77.2* 77.2* 77.5*  MCH 23.4* 23.7* 23.4*  MCHC 30.3 30.6 30.1  RDW 19.9* 19.9* 20.2*  PLT 240 212 195    Cardiac Enzymes Recent Labs  Lab 08/11/18 2246  08/12/18 0613 08/12/18 1200 08/12/18 1752  TROPONINI 0.03* 0.03* 0.03* 0.03*   No results for input(s): TROPIPOC in the last 168 hours.   BNP Recent Labs  Lab 08/11/18 2246  BNP 316.9*     DDimer No results for input(s): DDIMER in the last 168 hours.   Radiology    Dg Chest 2 View  Result Date: 08/16/2018 CLINICAL DATA:  Shortness of breath.  Pleural effusions. EXAM: CHEST - 2 VIEW COMPARISON:  Single-view of the chest 08/12/2018. PA and lateral chest 05/25/2018. FINDINGS: There is cardiomegaly and pulmonary edema. Associated small bilateral pleural effusions are seen. Aortic atherosclerosis is noted. AICD/pacing device is unchanged. IMPRESSION: Congestive heart failure with associated small pleural effusions. Electronically Signed   By: Inge Rise M.D.   On: 08/16/2018 12:57    Cardiac Studies      Patient Profile     84 year old male with past medical history  of coronary artery disease status post prior coronary artery bypass graft, ischemic cardiomyopathy, chronic combined systolic/diastolic congestive heart failure, paroxysmal atrial fibrillation, hypertension, chronic stage III kidney disease, prostate cancer, deafness for evaluation of acute on chronic combined systolic/diastolic congestive heart failure.   Assessment & Plan    1. Acute on Chronic Combined Systolic and Diastolic CHF:  breathing is much better.    Will restart on Lasix 40 mg a day  Losartan 25 mg a day ,  Coreg 3.125 mg BID ( roughly 1/2 of previous doses)   BP is stable  Potassium appears to be stable .   Will need a BMP checked in 1-2 weeks   2.  Atrial fib :  HR is well controlled.   will be restarting coreg at 3.125 BID Was on eliquis on admission but this has been held due to his GI bleeding and hx of recent falls.   3.  CAD -   No angina   He has requested a note to return to work and excusing him for being absent during this hospitalization    For questions or updates, please contact [place] Please consult [email or web address] for contact info under        Signed, Mertie Moores, MD  08/17/2018, 9:59 AM

## 2018-08-17 NOTE — Progress Notes (Signed)
Occupational Therapy Treatment Patient Details Name: Thomas [name] MRN: [id number] DOB: 31-May-1934 Today's Date: 08/17/2018    History of present illness Thomas [name] is a 84 y.o. male with PMH including CHF,CABG in 1995, AICD / Biventricular PPM placement, A.Fib, HTN. Patient presented to the ED with c/o generalized weakness, increased BLE edema, increased SOB, 2 falls. CHF   OT comments  Pt performing sit to stand with supervisionA; grooming at sink with set-upA and fair balance. Pt with no LOB episodes and ambulating with supervisionA. Pt with ASL interpreter in room. Pt performing lower body ADL tasks with modA overall due to LUE shoulder pain. Pt could benefit from continued OT skilled services for ADL and LB AE. Pt reports that he does not want 3in1 rather does not mind buying a toilet riser. OT to follow acutely.    Follow Up Recommendations  Home health OT;Supervision - Intermittent    Equipment Recommendations  3 in 1 bedside commode(pt reports that he wants to buy toilet riser )    Recommendations for Other Services      Precautions / Restrictions Precautions Precautions: Fall Restrictions Weight Bearing Restrictions: No       Mobility Bed Mobility Overal bed mobility: Modified Independent                Transfers Overall transfer level: Needs assistance   Transfers: Sit to/from Stand Sit to Stand: Supervision         General transfer comment: from higher surface with placement for BUEs    Balance Overall balance assessment: History of Falls                                         ADL either performed or assessed with clinical judgement   ADL Overall ADL's : Needs assistance/impaired     Grooming: Supervision/safety;Standing           Upper Body Dressing : Supervision/safety;Sitting;Standing   Lower Body Dressing: Moderate assistance;Sitting/lateral leans;Sit to/from stand Lower Body Dressing Details (indicate cue type  and reason): Pt's LUE shoulder unable to be used for donning socks Toilet Transfer: Supervision/safety;Cueing for safety;Cueing for sequencing   Toileting- Clothing Manipulation and Hygiene: Supervision/safety;Sitting/lateral lean;Sit to/from stand       Functional mobility during ADLs: Modified independent General ADL Comments: Pt is limited by pain in [place]?: No apparent visual deficits   Perception     Praxis      Cognition Arousal/Alertness: Awake/alert Behavior During Therapy: WFL for tasks assessed/performed Overall Cognitive Status: Within Functional Limits for tasks assessed                                 General Comments: Pt following commands, no memory impairment today.        Exercises     Shoulder Instructions       General Comments ASL Interpreter present    Pertinent Vitals/ Pain       Pain Assessment: 0-10 Pain Score: 2  Pain Location: Bil shoulder Pain Descriptors / Indicators: Aching;Sore Pain Intervention(s): Limited activity within patient's tolerance;Repositioned  Home Living  Prior Functioning/Environment              Frequency  Min 2X/week        Progress Toward Goals  OT Goals(current goals can now be found in the care plan section)  Progress towards OT goals: Progressing toward goals  Acute Rehab OT Goals Patient Stated Goal: decrease pain OT Goal Formulation: With patient Time For Goal Achievement: 08/26/18 Potential to Achieve Goals: Good ADL Goals Pt Will Perform Grooming: standing;with supervision Pt Will Perform Upper Body Bathing: with supervision;sitting Pt Will Perform Lower Body Bathing: with supervision;sitting/lateral leans Pt Will Transfer to Toilet: with supervision;ambulating Pt Will Perform Toileting - Clothing Manipulation and hygiene: with modified independence;sit to/from stand Pt Will  Perform Tub/Shower Transfer: with supervision;ambulating;3 in 1 Pt/caregiver will Perform Home Exercise Program: Left upper extremity;Independently;With written HEP provided  Plan Discharge plan remains appropriate    Co-evaluation                 AM-PAC OT "6 Clicks" Daily Activity     Outcome Measure   Help from another person eating meals?: None Help from another person taking care of personal grooming?: None Help from another person toileting, which includes using toliet, bedpan, or urinal?: None Help from another person bathing (including washing, rinsing, drying)?: A Little Help from another person to put on and taking off regular upper body clothing?: A Little Help from another person to put on and taking off regular lower body clothing?: A Little 6 Click Score: 21    End of Session Equipment Utilized During Treatment: Gait belt  OT Visit Diagnosis: Repeated falls (R29.6);Pain;History of falling (Z91.81) Pain - Right/Left: Left Pain - part of body: Shoulder   Activity Tolerance Patient tolerated treatment well   Patient Left in chair;with call bell/phone within reach;with family/visitor present   Nurse Communication Mobility status        Time: 1005-1020 OT Time Calculation (min): 15 min  Charges: OT General Charges $OT Visit: 1 Visit OT Treatments $Self Care/Home Management : 8-22 mins  Darryl Nestle) Marsa Aris OTR/L Acute Rehabilitation Services Pager: [phone] Office: [phone]   Jenene Slicker Rice Walsh 08/17/2018, 12:16 PM

## 2018-08-17 NOTE — TOC Transition Note (Signed)
Transition of Care [facility]) - CM/SW Discharge Note   Patient Details  Name: Thomas [name] MRN: [id number] Date of Birth: Sep 11, 1934  Transition of Care [facility]) CM/SW Contact:  Royston Bake, RN Phone Number: 08/17/2018, 11:08 AM   Clinical Narrative:    Patient changed his mind and now is requesting [place] services, pt chose [facility] ( [place]) [place] via sign interpreter; Dan with [facility] called for arrangements.    Final next level of care: [place] Barriers to Discharge: No Barriers Identified   Patient Goals and CMS Choice Patient states their goals for this hospitalization and ongoing recovery are:: to get better CMS Medicare.gov Compare Post Acute Care list provided to:: Patient Choice offered to / list presented to : Patient  Discharge Placement                       Discharge Plan and Services In-house Referral: NA Discharge Planning Services: CM Consult Post Acute Care Choice: NA          DME Arranged: N/A DME Agency: NA       HH Arranged: RN, Disease Management, PT [place] Agency: [place] ([place]) Date HH Agency Contacted: 08/17/18 Time [place]: 1107 Representative spoke with at [place]: Dan RN  Social Determinants of Health ([place]) Interventions     Readmission Risk Interventions No flowsheet data found.

## 2018-08-17 NOTE — Plan of Care (Signed)
  Problem: Education: Goal: Ability to demonstrate management of disease process will improve Outcome: Adequate for Discharge   Problem: Education: Goal: Ability to verbalize understanding of medication therapies will improve Outcome: Adequate for Discharge   Problem: Coping: Goal: Level of anxiety will decrease Outcome: Adequate for Discharge

## 2018-08-17 NOTE — Consult Note (Signed)
   [facility] CM Inpatient Consult   08/17/2018  Thomas [name] Jul 17, 1934 [id number]   Patient screened for high risk score for unplanned readmission score  to check if potential [place] [facility] services are needed.  Patient in the [employer] plan.  Patient admitted with HF exacerbation.  Patient's primary care provider: [name], MD Lysle Morales.  This provider is listed to provide the transition of care follow up call.   Patient had been recommended for SNF initially rehab. Patient communicates with sign language. Chart review of  inpatient [facility] team notes of patient agreed to have home health care after refusing initially. No community follow up assessed for barriers at this time.   For questions or needs please contact:   Natividad Brood, RN BSN [facility] Liaison  [phone] business mobile phone Toll free office [phone]  Fax number: [phone] [email or web address]

## 2018-08-17 NOTE — TOC Transition Note (Signed)
Transition of Care [facility]) - CM/SW Discharge Note   Patient Details  Name: Thomas [name] MRN: [id number] Date of Birth: 1934/06/22  Transition of Care [facility]) CM/SW Contact:  Sherrilyn Rist Phone Number: [phone] 08/17/2018, 8:05 AM   Clinical Narrative:    CM following for progression of care; pt is refusing all [place] services at this time. Please see full CM assessment on 08/12/2018.   Final next level of care: Home/Self Care Barriers to Discharge: No Barriers Identified   Patient Goals and CMS Choice Patient states their goals for this hospitalization and ongoing recovery are:: to get better CMS Medicare.gov Compare Post Acute Care list provided to:: Patient Choice offered to / list presented to : Patient  Discharge Placement                       Discharge Plan and Services In-house Referral: NA Discharge Planning Services: CM Consult Post Acute Care Choice: NA          DME Arranged: N/A DME Agency: NA       HH Arranged: Patient Refused [place] Agency: NA        Social Determinants of Health (SDOH) Interventions     Readmission Risk Interventions No flowsheet data found.

## 2018-08-17 NOTE — Care Management Important Message (Signed)
Important Message  Patient Details  Name: Thomas [name] MRN: [id number] Date of Birth: 1935/03/03   Medicare Important Message Given:  Yes    [name] 08/17/2018, 2:02 PM

## 2018-08-18 ENCOUNTER — Other Ambulatory Visit: Payer: Self-pay | Admitting: *Deleted

## 2018-08-18 ENCOUNTER — Ambulatory Visit: Payer: PPO | Admitting: Family

## 2018-08-18 ENCOUNTER — Telehealth: Payer: Self-pay | Admitting: Cardiovascular Disease

## 2018-08-18 MED ORDER — VITAMIN B-12 500 MCG PO TABS: 500.0000 ug | ORAL_TABLET | Freq: Every day | ORAL | 1 refills | Status: DC

## 2018-08-18 NOTE — Telephone Encounter (Signed)
Please refill it for him.  He can have a 90-day supply with 3 refills.

## 2018-08-18 NOTE — Progress Notes (Signed)
Assessment done and medications given this morning with an interpreter present.   During this timeLoss adjuster, chartered, Cardiologist, and OT came in.   Wife was present during cardiologist visit and all questions were answered using the interpreter.

## 2018-08-18 NOTE — Progress Notes (Signed)
Discussed discharge instructions and medications with patient and wife using an interpreter.   All questions were answered and patient discharge home with wife.

## 2018-08-18 NOTE — Discharge Summary (Signed)
[facility] Discharge Summary   Patient: Thomas [name] ZOX:[id number]   PCP: [name], MD DOB: 1934/10/13   Date of admission: 08/11/2018   Date of discharge: 08/17/2018     Discharge Diagnoses:   Principal Problem:   Acute on chronic combined systolic and diastolic CHF (congestive heart failure) ([place]) Active Problems:   Permanent atrial fibrillation [facility])   Biventricular ICD Medtronic Viva single chamber October 2014   Benign essential HTN   Status post coronary artery bypass grafting   Bilateral deafness   Anemia   Acute on chronic systolic CHF (congestive heart failure) ([place])   Hypotension   Admitted From: home Disposition:  Home with home health  Recommendations for Outpatient Follow-up:  1. Please follow up with PCP in 1 week   Follow-up Information    [name], MD. Go on 08/23/2018.   Specialty:  Family Medicine Why:  @1 :30pm Contact information: [address] [place] [address] [phone]        Sanda Klein, MD. Go on 08/30/2018.   Specialty:  Cardiology Why:  @11 :00am with Andres Shad virtual visit Contact information: [address] [place] [address] [phone]        Efrain Sella, MD. Go on 09/02/2018.   Specialty:  Gastroenterology Why:  follow up as scheduled to discuss anemia and resumption of blood thinner.  Contact information: [address] [place]  [address] [phone]        [facility] Follow up.   Specialty:  Home Health Services Why:  They will do your home health care at your home         Diet recommendation: cardiac diet  Activity: The patient is advised to gradually reintroduce usual activities.  Discharge Condition: good  Code Status: full code  History of present illness: As per the H and P dictated on admission, "ALBINO BUFFORD is a 84 y.o. male with medical history significant of CHF dt ICM with EF 35-40% on most recent echo in Feb  this year, he is s/p 3x CABG in 1995 and AICD / Biventricular PPM placement.  He also has A.Fib on eliquis and HTN.  Patient presents to the ED with c/o generalized weakness, increased BLE edema, increased SOB.  Symptoms ongoing over the past 1 month.  Today he had 2 falls which is what ultimately prompted him to present to the ED.  No sick contacts, no fever, no urinary symptoms.  Pain in L arm and ribs where he fell this AM."  Hospital Course:  Summary of his active problems in the hospital is as following. 1. Hypotension.  Suspect either overdiuresis.  SBP's stable and much better than yesterday.  Hemoglobin stable and no active signs of blood loss will resume coreg 3.125 mg BID, losartan 25 mg, and oral lasix 40 mg, close BP monitoring.    2. Acute on chronic Microcytic anemia secondary to iron deficient anemia.  Recent admission in March for symptomatic anemia related to erythematous to adenopathy.  Hypotensive episode with poor hemoglobin response to 1 unit on 6/1concerning for potential GI bleed, however, hemoglobin stable , without melena and improved BP. Will remain off Eliquis, plan for outpatient endoscopic evaluation by GI at [facility] in June.   3. Acute on chronic ischemic CHF with combined systolic/diastolic exacerbation (TTE, 2/20: EF 35- 40%).  Weights inconsistent, negative 7 L this admission with IV lasix, CXR with some pulmonary edema and small pleural effusions. Resume coreg, losartan, and oral  lasix today and closely monitor BP and fluid status, cardiology following. Discuss with Dr. Sallyanne Kuster in follow up about restarting eliquis  4. Reported falls x2 prior to admission.  Orthostatics negative on admission.  No findings on ICD interrogation.  X-ray left arm/ribs negative for acute fractures. Improved ambulation with PT.  Holding eliquis. Pain management with PRN Tylenol, Flexeril, Norco,   5. Paroxysmal atrial fibrillation, ventricular paced rhythm on telemetry, continue  Coreg, holding Eliquis while monitoring for any bleed/drop in hemoglobin  6. HTN.  SBP better with no hypotension n last 24-48 hours, resuming home BB, Acei. Monitor.   7. CAD status post CABG (remote history), Asymptomatic  Continue home Imdur, Zocor.  8. CKD stage III stable at baseline (1.2-1.7), avoid nephrotoxins, monitor BMP  9. Hyperlipidemia. continue statin  Patient was seen by physical therapy, who recommended home health, which was arranged by case manager. On the day of the discharge the patient's vitals were stable , and no other acute medical condition were reported by patient. the patient was felt safe to be discharge at home with home health.  Consultants: cardiology  Procedures: none  DISCHARGE MEDICATION: Allergies as of 08/17/2018      Reactions   Phenazopyridine Nausea Only, Other (See Comments)   GI UPSET   Ramipril Other (See Comments)   unk Other reaction(s): Other (See Comments), Unknown unk      Medication List    STOP taking these medications   apixaban 5 MG Tabs tablet Commonly known as:  Eliquis   azithromycin 250 MG tablet Commonly known as:  ZITHROMAX   benzonatate 200 MG capsule Commonly known as:  TESSALON   predniSONE 10 MG tablet Commonly known as:  DELTASONE     TAKE these medications   albuterol 108 (90 Base) MCG/ACT inhaler Commonly known as:  VENTOLIN HFA Inhale 2 puffs into the lungs 4 (four) times daily as needed. What changed:  reasons to take this   carvedilol 3.125 MG tablet Commonly known as:  COREG Take 1 tablet (3.125 mg total) by mouth 2 (two) times daily with a meal. What changed:    medication strength  how much to take   ferrous sulfate 325 (65 FE) MG tablet Take 1 tablet (325 mg total) by mouth daily for 30 days.   furosemide 40 MG tablet Commonly known as:  LASIX Take 1 tablet (40 mg total) by mouth daily. What changed:  how much to take   HYDROCORTISONE (TOPICAL) 2 % Lotn Apply 40 oz topically 2  (two) times daily. APPLY BID TO AFFECTED AREA What changed:    when to take this  reasons to take this   isosorbide mononitrate 30 MG 24 hr tablet Commonly known as:  IMDUR Take 1 tablet (30 mg total) by mouth daily. What changed:    medication strength  how much to take  how to take this  when to take this  additional instructions   losartan 25 MG tablet Commonly known as:  COZAAR Take 1 tablet (25 mg total) by mouth daily. What changed:    medication strength  how much to take   pantoprazole 40 MG tablet Commonly known as:  PROTONIX Take 1 tablet (40 mg total) by mouth daily for 30 days.   simvastatin 20 MG tablet Commonly known as:  ZOCOR Take 1 tablet (20 mg total) by mouth daily.   vitamin B-12 500 MCG tablet Commonly known as:  CYANOCOBALAMIN Take 500 mcg by mouth daily.  Allergies  Allergen Reactions   Phenazopyridine Nausea Only and Other (See Comments)    GI UPSET   Ramipril Other (See Comments)    unk Other reaction(s): Other (See Comments), Unknown unk   Discharge Instructions    Diet - low sodium heart healthy   Complete by:  As directed    Discharge instructions   Complete by:  As directed    It is important that you read the given instructions as well as go over your medication list with RN to help you understand your care after this hospitalization.  Discharge Instructions: Please follow-up with PCP in 1-2 weeks  Please request your primary care physician to go over all Hospital Tests and Procedure/Radiological results at the follow up. Please get all Hospital records sent to your PCP by signing hospital release before you go home.   Do not take more than prescribed Pain, Sleep and Anxiety Medications. You were cared for by a hospitalist during your hospital stay. If you have any questions about your discharge medications or the care you received while you were in the hospital after you are discharged, you can call the unit  @UNIT @ you were admitted to and ask to speak with the hospitalist on call if the hospitalist that took care of you is not available.  Once you are discharged, your primary care physician will handle any further medical issues. Please note that NO REFILLS for any discharge medications will be authorized once you are discharged, as it is imperative that you return to your primary care physician (or establish a relationship with a primary care physician if you do not have one) for your aftercare needs so that they can reassess your need for medications and monitor your lab values. You Must read complete instructions/literature along with all the possible adverse reactions/side effects for all the Medicines you take and that have been prescribed to you. Take any new Medicines after you have completely understood and accept all the possible adverse reactions/side effects. Wear Seat belts while driving. If you have smoked or chewed Tobacco in the last 2 yrs please stop smoking and/or stop any Recreational drug use.  If you drink alcohol, please moderate the use and do not drive, operating heavy machinery, perform activities at heights, swimming or participation in water activities or provide baby sitting services under influence.   Increase activity slowly   Complete by:  As directed      Discharge Exam: Filed Weights   08/15/18 0437 08/16/18 0500 08/17/18 0513  Weight: 101.3 kg 100.6 kg 100.3 kg   Vitals:   08/17/18 0513 08/17/18 0947  BP: 123/66 124/63  Pulse: 63 78  Resp: 20   Temp: 97.8 F (36.6 C)   SpO2: 93% 98%   General: Appear in no distress, no Rash; Oral Mucosa Clear, moist. no Abnormal Mass Or lumps Cardiovascular: S1 and S2 Present, no Murmur, Respiratory: normal respiratory effort, Bilateral Air entry present and Clear to Auscultation, no Crackles, no wheezes Abdomen: Bowel Sound present, Soft and no tenderness, no hernia Extremities: no Pedal edema, no calf  tenderness Neurology: alert and oriented to time, place, and person affect appropriate. normal without focal findings, mental status, speech normal, alert and oriented x3, PERLA, Motor strength 5/5 and symmetric and sensation grossly normal   The results of significant diagnostics from this hospitalization (including imaging, microbiology, ancillary and laboratory) are listed below for reference.    Significant Diagnostic Studies: Ct Abdomen Pelvis Wo Contrast  Result Date: 08/14/2018 CLINICAL  DATA:  New hypotension with dropping hemoglobin. Evaluate for retroperitoneal bleed. Abdominal pain EXAM: CT ABDOMEN AND PELVIS WITHOUT CONTRAST TECHNIQUE: Multidetector CT imaging of the abdomen and pelvis was performed following the standard protocol without IV contrast. COMPARISON:  05/27/2018 FINDINGS: Lower chest: Cardiomegaly. Biventricular pacer leads. Moderate layering pleural effusions. Hepatobiliary: Lobulated liver could reflect cirrhosis-as previously suggested. The caudate lobe is also large.No evidence of biliary obstruction or stone. Pancreas: Stable mild fat haziness around the pancreas which is likely related to generalized volume overload given stability. Spleen: Granulomatous calcification Adrenals/Urinary Tract: Negative adrenals. No hydronephrosis or stone. Advanced right renal scarring. No acute bladder finding. Stomach/Bowel: No obstruction. No inflammatory bowel wall thickening. Vascular/Lymphatic: Extensive atherosclerotic plaque. There is been stent grafting of the abdominal aorta. Bilateral common iliac artery aneurysm measuring up to 2.2 cm diameter on the left. No retroperitoneal or other hemorrhage. Reproductive:Enlarged prostate with eccentric left-sided fullness which has been seen since at least 2017. There are prostate brachytherapy seeds. Other: No ascites or pneumoperitoneum. Musculoskeletal: Degenerative disease without acute finding. Anasarca IMPRESSION: 1. Negative for  retroperitoneal hemorrhage or other specific cause of anemia. 2. No acute finding in the abdomen. 3. Moderate pleural effusions which have increased from 05/27/2018. Electronically Signed   By: Monte Fantasia M.D.   On: 08/14/2018 15:08   Dg Chest 2 View  Result Date: 08/16/2018 CLINICAL DATA:  Shortness of breath.  Pleural effusions. EXAM: CHEST - 2 VIEW COMPARISON:  Single-view of the chest 08/12/2018. PA and lateral chest 05/25/2018. FINDINGS: There is cardiomegaly and pulmonary edema. Associated small bilateral pleural effusions are seen. Aortic atherosclerosis is noted. AICD/pacing device is unchanged. IMPRESSION: Congestive heart failure with associated small pleural effusions. Electronically Signed   By: Inge Rise M.D.   On: 08/16/2018 12:57   Dg Ribs Unilateral W/chest Left  Result Date: 08/12/2018 CLINICAL DATA:  Recent falls with left-sided chest pain, initial encounter EXAM: LEFT RIBS AND CHEST - 3+ VIEW COMPARISON:  05/25/2018 FINDINGS: Cardiac shadow is enlarged. Defibrillator is again seen as well as postoperative changes. Lungs are well aerated bilaterally. Stable small left pleural effusion is noted as well as left basilar atelectasis. No acute rib abnormality is noted. IMPRESSION: No acute rib abnormality is seen. Stable small left pleural effusion and left basilar atelectasis. Electronically Signed   By: Inez Catalina M.D.   On: 08/12/2018 00:59   Ct Head Wo Contrast  Result Date: 08/12/2018 CLINICAL DATA:  Recent falls with blood thinners EXAM: CT HEAD WITHOUT CONTRAST TECHNIQUE: Contiguous axial images were obtained from the base of the skull through the vertex without intravenous contrast. COMPARISON:  05/19/2018 FINDINGS: Brain: Chronic atrophic and ischemic changes are noted. Lacunar infarcts are noted within the left thalamus better visualized than on the prior exam. No findings to suggest acute hemorrhage, acute infarction or space-occupying mass lesion are noted. Vascular:  No hyperdense vessel or unexpected calcification. Skull: Normal. Negative for fracture or focal lesion. Sinuses/Orbits: No acute finding. Other: None. IMPRESSION: Chronic atrophic and ischemic changes. 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 No acute abnormality noted. Electronically Signed   By: Inez Catalina M.D.   On: 08/12/2018 02:06   Dg Humerus Left  Result Date: 08/12/2018 CLINICAL DATA:  Increasing falls over last 2 weeks with left arm pain, initial encounter EXAM: LEFT HUMERUS - 2+ VIEW COMPARISON:  None. FINDINGS: Degenerative changes of the acromioclavicular joint are noted. No acute fracture or dislocation is noted. No soft tissue abnormality is seen. IMPRESSION: No acute abnormality  noted. Electronically Signed   By: Inez Catalina M.D.   On: 08/12/2018 00:57    Microbiology: Recent Results (from the past 240 hour(s))  SARS Coronavirus 2 (CEPHEID - Performed in [place] hospital lab), Hosp Order     Status: None   Collection Time: 08/11/18 10:46 PM  Result Value Ref Range Status   SARS Coronavirus 2 NEGATIVE NEGATIVE Final    Comment: (NOTE) If result is NEGATIVE SARS-CoV-2 target nucleic acids are NOT DETECTED. The SARS-CoV-2 RNA is generally detectable in upper and lower  respiratory specimens during the acute phase of infection. The lowest  concentration of SARS-CoV-2 viral copies this assay can detect is 250  copies / mL. A negative result does not preclude SARS-CoV-2 infection  and should not be used as the sole basis for treatment or other  patient management decisions.  A negative result may occur with  improper specimen collection / handling, submission of specimen other  than nasopharyngeal swab, presence of viral mutation(s) within the  areas targeted by this assay, and inadequate number of viral copies  (<250 copies / mL). A negative result must be combined with clinical  observations, patient history, and epidemiological information. If result is  POSITIVE SARS-CoV-2 target nucleic acids are DETECTED. The SARS-CoV-2 RNA is generally detectable in upper and lower  respiratory specimens dur ing the acute phase of infection.  Positive  results are indicative of active infection with SARS-CoV-2.  Clinical  correlation with patient history and other diagnostic information is  necessary to determine patient infection status.  Positive results do  not rule out bacterial infection or co-infection with other viruses. If result is PRESUMPTIVE POSTIVE SARS-CoV-2 nucleic acids MAY BE PRESENT.   A presumptive positive result was obtained on the submitted specimen  and confirmed on repeat testing.  While 2019 novel coronavirus  (SARS-CoV-2) nucleic acids may be present in the submitted sample  additional confirmatory testing may be necessary for epidemiological  and / or clinical management purposes  to differentiate between  SARS-CoV-2 and other Sarbecovirus currently known to infect humans.  If clinically indicated additional testing with an alternate test  methodology [id number]) is advised. The SARS-CoV-2 RNA is generally  detectable in upper and lower respiratory sp ecimens during the acute  phase of infection. The expected result is Negative. Fact Sheet for Patients:  StrictlyIdeas.no Fact Sheet for Healthcare Providers: BankingDealers.co.za This test is not yet approved or cleared by the [place] FDA and has been authorized for detection and/or diagnosis of SARS-CoV-2 by FDA under an Emergency Use Authorization (EUA).  This EUA will remain in effect (meaning this test can be used) for the duration of the COVID-19 declaration under Section 564(b)(1) of the Act, 21 U.S.C. section 360bbb-3(b)(1), unless the authorization is terminated or revoked sooner. Performed at [facility], [place] [address] [place], [place] [address]      Labs: CBC: Recent Labs  Lab 08/14/18 1747  08/15/18 0251 08/15/18 1045 08/16/18 0537 08/17/18 0453  WBC 6.7 6.1 7.0 5.6 5.8  HGB 7.9* 7.8* 8.2* 7.9* 7.8*  HCT 25.2* 25.6* 27.1* 25.8* 25.9*  MCV 75.4* 75.5* 77.2* 77.2* 77.5*  PLT 240 219 240 212 811   Basic Metabolic Panel: Recent Labs  Lab 08/13/18 0502 08/14/18 0545 08/15/18 0251 08/16/18 0537 08/17/18 0453  NA 138 136 136 138 140  K 4.1 4.1 4.2 4.1 4.2  CL 104 101 105 105 106  CO2 26 26 25 24 24   GLUCOSE 97 101* 98  95 95  BUN 24* 25* 25* 21 21  CREATININE 1.43* 1.68* 1.69* 1.41* 1.43*  CALCIUM 8.1* 8.2* 8.1* 8.2* 8.3*   Liver Function Tests: No results for input(s): AST, ALT, ALKPHOS, BILITOT, PROT, ALBUMIN in the last 168 hours. No results for input(s): LIPASE, AMYLASE in the last 168 hours. No results for input(s): AMMONIA in the last 168 hours. Cardiac Enzymes: Recent Labs  Lab 08/11/18 2246 08/12/18 0613 08/12/18 1200 08/12/18 1752  TROPONINI 0.03* 0.03* 0.03* 0.03*   BNP (last 3 results) Recent Labs    03/14/18 1736 05/25/18 1651 08/11/18 2246  BNP 375.8* 292.5* 316.9*   CBG: No results for input(s): GLUCAP in the last 168 hours. Time spent: 35 minutes  Signed:  Berle Mull  [facility] 08/17/2018

## 2018-08-18 NOTE — Telephone Encounter (Signed)
°*  STAT* If patient is at the pharmacy, call can be transferred to refill team.   1. Which medications need to be refilled? (please list name of each medication and dose if known) vitamin B-12 (CYANOCOBALAMIN) 500 MCG tablet  2. Which pharmacy/location (including street and city if local pharmacy) is medication to be sent to? [id number] [address], [place]  3. Do they need a 30 day or 90 day supply? 90 days  Patient is out of medication

## 2018-08-19 ENCOUNTER — Telehealth: Payer: Self-pay | Admitting: Adult Health

## 2018-08-19 NOTE — Telephone Encounter (Signed)
New Messages            *STAT* If patient is at the pharmacy, call can be transferred to refill team.   1. Which medications need to be refilled? (please list name of each medication and dose if known) HYDROCORTISONE, TOPICAL, 2 % LOTN  2. Which pharmacy/location (including street and city if local pharmacy) is medication to be sent to? [facility] [address]  3. Do they need a 30 day or 90 day supply? 90   Patient would like a call back when done.

## 2018-08-22 ENCOUNTER — Other Ambulatory Visit: Payer: Self-pay

## 2018-08-22 NOTE — Telephone Encounter (Signed)
Refill

## 2018-08-23 ENCOUNTER — Telehealth: Payer: Self-pay | Admitting: Internal Medicine

## 2018-08-23 DIAGNOSIS — I5022 Chronic systolic (congestive) heart failure: Secondary | ICD-10-CM | POA: Diagnosis not present

## 2018-08-23 DIAGNOSIS — D509 Iron deficiency anemia, unspecified: Secondary | ICD-10-CM | POA: Diagnosis not present

## 2018-08-23 MED ORDER — HYDROCORTISONE 2 % EX LOTN: 40.0000 [oz_av] | TOPICAL_LOTION | Freq: Two times a day (BID) | CUTANEOUS | 3 refills | Status: DC | PRN

## 2018-08-23 NOTE — Telephone Encounter (Signed)
New Message             Patient's wife is calling to see if her husband is to take Lasix or not? Pls call to advise

## 2018-08-23 NOTE — Telephone Encounter (Signed)
Returned call to patient's wife.She stated since husband came home from hospital he has not been taking Lasix.Stated she was told this could cause a stroke.Advised I will send message to Dr.Croitoru for advice.

## 2018-08-23 NOTE — Telephone Encounter (Signed)
He needs to take the lasix 40 mg daily. It does not cause strokes. He will have heart failure and end up in the hospital again if he does not. Please find out how much he weighed when he arrived from the hospital and how much he weighs today.

## 2018-08-23 NOTE — Telephone Encounter (Signed)
Returned call to patient left message on personal voice mail refill sent to pharmacy.

## 2018-08-24 NOTE — Telephone Encounter (Signed)
Spoke to wife through a interpreter Dr.Croitoru's advice given.Husband weighed 236 lbs at hospital and this morning he weighs 227 lbs.He will take Lasix 40 mg  daily when he finishes working at 1:00 pm.Advised to call office if he gains 3 lbs overnight.

## 2018-08-24 NOTE — Telephone Encounter (Signed)
Thank you :)

## 2018-08-29 ENCOUNTER — Other Ambulatory Visit: Payer: Self-pay

## 2018-08-29 ENCOUNTER — Telehealth: Payer: PPO | Admitting: Adult Health

## 2018-08-29 ENCOUNTER — Encounter: Payer: Self-pay | Admitting: Family

## 2018-08-29 ENCOUNTER — Ambulatory Visit: Payer: PPO | Attending: Family | Admitting: Family

## 2018-08-29 VITALS — BP 114/66 | HR 83 | Temp 98.3°F | Resp 18 | Ht 74.0 in | Wt 231.4 lb

## 2018-08-29 DIAGNOSIS — Z87891 Personal history of nicotine dependence: Secondary | ICD-10-CM | POA: Insufficient documentation

## 2018-08-29 DIAGNOSIS — I480 Paroxysmal atrial fibrillation: Secondary | ICD-10-CM | POA: Insufficient documentation

## 2018-08-29 DIAGNOSIS — Z79899 Other long term (current) drug therapy: Secondary | ICD-10-CM | POA: Diagnosis not present

## 2018-08-29 DIAGNOSIS — Z883 Allergy status to other anti-infective agents status: Secondary | ICD-10-CM | POA: Insufficient documentation

## 2018-08-29 DIAGNOSIS — I255 Ischemic cardiomyopathy: Secondary | ICD-10-CM | POA: Insufficient documentation

## 2018-08-29 DIAGNOSIS — Z888 Allergy status to other drugs, medicaments and biological substances status: Secondary | ICD-10-CM | POA: Diagnosis not present

## 2018-08-29 DIAGNOSIS — Z8546 Personal history of malignant neoplasm of prostate: Secondary | ICD-10-CM | POA: Insufficient documentation

## 2018-08-29 DIAGNOSIS — I130 Hypertensive heart and chronic kidney disease with heart failure and stage 1 through stage 4 chronic kidney disease, or unspecified chronic kidney disease: Secondary | ICD-10-CM | POA: Insufficient documentation

## 2018-08-29 DIAGNOSIS — Z951 Presence of aortocoronary bypass graft: Secondary | ICD-10-CM | POA: Insufficient documentation

## 2018-08-29 DIAGNOSIS — Z8249 Family history of ischemic heart disease and other diseases of the circulatory system: Secondary | ICD-10-CM | POA: Insufficient documentation

## 2018-08-29 DIAGNOSIS — Z950 Presence of cardiac pacemaker: Secondary | ICD-10-CM | POA: Diagnosis not present

## 2018-08-29 DIAGNOSIS — I25810 Atherosclerosis of coronary artery bypass graft(s) without angina pectoris: Secondary | ICD-10-CM | POA: Diagnosis not present

## 2018-08-29 DIAGNOSIS — D649 Anemia, unspecified: Secondary | ICD-10-CM | POA: Diagnosis not present

## 2018-08-29 DIAGNOSIS — I890 Lymphedema, not elsewhere classified: Secondary | ICD-10-CM | POA: Insufficient documentation

## 2018-08-29 DIAGNOSIS — N183 Chronic kidney disease, stage 3 (moderate): Secondary | ICD-10-CM | POA: Insufficient documentation

## 2018-08-29 DIAGNOSIS — I10 Essential (primary) hypertension: Secondary | ICD-10-CM

## 2018-08-29 DIAGNOSIS — I5022 Chronic systolic (congestive) heart failure: Secondary | ICD-10-CM | POA: Insufficient documentation

## 2018-08-29 NOTE — Progress Notes (Signed)
Patient ID: Thomas [name], male       DOB: 30-Nov-2034, 84 y.o.      MRN [id number]  Entire visit was done in the presence of the sign language interpreter  HPI  Mr Keena is a 84 y/o male with a history of CAD,HTN, CKD, PAF, peptic ulcer, bilateral deafness, previous tobacco use and chronic heart failure.   Echo report from 05/02/2018 reviewed and showed an EF of 35-40% along with mild/ moderate MR, mild AR and moderate pulmonary HTN.Echo report from 11/25/15 reviewed and showed an EF of 45-50% along with mild AR, moderate MR and mild-moderate TR.   Admitted 08/11/2018 due to weakness, edema and recent falls. Cardiology consult obtained. Initially needed IV lasix and then developed hypotension. Lost ~ 7L during admission. Discharged after 6 days. Admitted 05/25/2018 due to anemia and HF. GI consulted. Had EGD done and had iron infusion. Discharged after 3 days.   He presents today for a follow-up visit with a chief complaint of minimal fatigue upon moderate exertion. He describes this as chronic in nature having been present for several years. He has associated pedal edema along with this. He denies any difficulty sleeping, dizziness, abdominal distention, palpitations, chest pain, shortness of breath, cough or weight gain. He says that he's not adding salt to his food but doesn't weigh daily and when he does weigh, he doesn't write it down.   Past Medical History:  Diagnosis Date  . AICD (automatic cardioverter/defibrillator) present   . Biventricular ICD (implantable cardioverter-defibrillator) in place 03/24/2005   Implantation of a Medtronic Adapta ADDRO1, serial number [id number]  . CHF (congestive heart failure) ([place])   . CKD (chronic kidney disease), stage III ([place])   . Coronary artery disease    a. s/p CABG 1986. b. Multiple PCIs/caths. c. 09/2013: s/p PTCA and BMS to SVG-OM.  [name]   . Dysrhythmia   . History of abdominal aortic aneurysm   . History of bleeding peptic ulcer 1980  .  History of epididymitis 2013  . HTN (hypertension)   . Hydronephrosis with ureteropelvic junction obstruction   . Hydroureter on left 2009  . Hypertension   . Ischemic cardiomyopathy    a. Prior EF 30-35%, s/p BIV-ICD. b. 09/2013: EF 45-50%.  . Moderate tricuspid regurgitation   . PAF (paroxysmal atrial fibrillation) ([place])   . Presence of permanent cardiac pacemaker   . Prostate cancer ([place])   . Status post coronary artery bypass grafting 1986   LIMA to the LAD, SVG to OM, SVG to RCA  . Testicular swelling    Past Surgical History:  Procedure Laterality Date  . 2-D echocardiogram  11/20/2011   Ejection fraction 30-35% moderate concentric left ventricular hypertrophy. Left atrium is moderately dilated. Mild MR. Mild or  . BI-VENTRICULAR IMPLANTABLE CARDIOVERTER DEFIBRILLATOR N/A 12/16/2012   Procedure: BI-VENTRICULAR IMPLANTABLE CARDIOVERTER DEFIBRILLATOR  (CRT-D);  Surgeon: [name], MD;  Location: [facility] CATH LAB;  Service: Cardiovascular;  Laterality: N/A;  . CARDIAC CATHETERIZATION  12/10/2011   SVG to OM widely patent.  LIMA to LAD patent  . CATARACT EXTRACTION W/PHACO Right 10/12/2017   Procedure: CATARACT EXTRACTION PHACO AND INTRAOCULAR LENS PLACEMENT (IOC);  Surgeon: Birder Robson, MD;  Location: [facility] ORS;  Service: Ophthalmology;  Laterality: Right;  [place] 00:57 AP% 15.9 CDE 9.07 Fluid pack lot # [id number]  . COLONOSCOPY N/A 07/13/2018   Procedure: COLONOSCOPY;  Surgeon: Toledo, Benay Pike, MD;  Location: [facility] ENDOSCOPY;  Service: Gastroenterology;  Laterality: N/A;  . CORONARY  ARTERY BYPASS GRAFT  1986  . ESOPHAGOGASTRODUODENOSCOPY N/A 07/13/2018   Procedure: ESOPHAGOGASTRODUODENOSCOPY (EGD);  Surgeon: Toledo, Benay Pike, MD;  Location: [facility] ENDOSCOPY;  Service: Gastroenterology;  Laterality: N/A;  . ESOPHAGOGASTRODUODENOSCOPY (EGD) WITH PROPOFOL N/A 05/27/2018   Procedure: ESOPHAGOGASTRODUODENOSCOPY (EGD) WITH PROPOFOL;  Surgeon: Clarene Essex, MD;  Location: [place];   Service: Endoscopy;  Laterality: N/A;  . INSERT / REPLACE / REMOVE PACEMAKER    . LEFT HEART CATHETERIZATION WITH CORONARY/GRAFT ANGIOGRAM N/A 12/10/2011   Procedure: LEFT HEART CATHETERIZATION WITH Beatrix Fetters;  Surgeon: Sanda Klein, MD;  Location: [facility] CATH LAB;  Service: Cardiovascular;  Laterality: N/A;  . LEFT HEART CATHETERIZATION WITH CORONARY/GRAFT ANGIOGRAM N/A 09/25/2013   Procedure: LEFT HEART CATHETERIZATION WITH Beatrix Fetters;  Surgeon: Blane Ohara, MD;  Location: [facility] CATH LAB;  Service: Cardiovascular;  Laterality: N/A;  . Persantine Myoview  05/06/2010   Post-rest ejection fraction 30%. No significant ischemia demonstrated. Compared to previous study there is no significant change.  . TRANSURETHRAL RESECTION OF PROSTATE     s/p   Family History  Problem Relation Age of Onset  . Hypertension Father    Social History   Tobacco Use  . Smoking status: Former Smoker    Quit date: 03/15/1985    Years since quitting: 33.4  . Smokeless tobacco: Never Used  Substance Use Topics  . Alcohol use: No    Comment: occas.   Allergies  Allergen Reactions  . Phenazopyridine Nausea Only and Other (See Comments)    GI UPSET  . Ramipril Other (See Comments)    unk Other reaction(s): Other (See Comments), Unknown unk   Prior to Admission medications   Medication Sig Start Date End Date Taking? Authorizing Provider  albuterol (VENTOLIN HFA) 108 (90 Base) MCG/ACT inhaler Inhale 2 puffs into the lungs 4 (four) times daily as needed. Patient taking differently: Inhale 2 puffs into the lungs 4 (four) times daily as needed for wheezing or shortness of breath.  08/02/18  Yes Croitoru, Mihai, MD  carvedilol (COREG) 3.125 MG tablet Take 1 tablet (3.125 mg total) by mouth 2 (two) times daily with a meal. 08/17/18  Yes Lavina Hamman, MD  ferrous sulfate 325 (65 FE) MG tablet Take 1 tablet (325 mg total) by mouth daily for 30 days. 05/28/18  Yes Elodia Florence.,  MD  furosemide (LASIX) 40 MG tablet Take 1 tablet (40 mg total) by mouth daily. 08/24/18  Yes Croitoru, Mihai, MD  HYDROCORTISONE, TOPICAL, 2 % LOTN Apply 40 oz topically 2 (two) times daily as needed (as needed for itching on foot). APPLY BID TO AFFECTED AREA 08/23/18  Yes Croitoru, Mihai, MD  isosorbide mononitrate (IMDUR) 30 MG 24 hr tablet Take 1 tablet (30 mg total) by mouth daily. 08/18/18  Yes Lavina Hamman, MD  losartan (COZAAR) 25 MG tablet Take 1 tablet (25 mg total) by mouth daily. 08/18/18  Yes Lavina Hamman, MD  pantoprazole (PROTONIX) 40 MG tablet Take 1 tablet (40 mg total) by mouth daily for 30 days. 05/29/18  Yes Elodia Florence., MD  simvastatin (ZOCOR) 20 MG tablet Take 1 tablet (20 mg total) by mouth daily. 06/14/18  Yes Lendon Colonel, NP  vitamin B-12 (CYANOCOBALAMIN) 500 MCG tablet Take 1 tablet (500 mcg total) by mouth daily. 08/18/18  Yes Maisie Fus, MD     Review of Systems  Constitutional: Positive for fatigue. Negative for appetite change.  HENT: Positive for hearing loss. Negative for congestion, rhinorrhea and  sore throat.   Eyes: Negative.   Respiratory: Negative for cough, chest tightness and shortness of breath.   Cardiovascular: Positive for leg swelling. Negative for chest pain and palpitations.  Gastrointestinal: Negative for abdominal distention and abdominal pain.  Endocrine: Negative.   Genitourinary: Negative.   Musculoskeletal: Positive for arthralgias (left shoulder). Negative for back pain.  Skin: Negative.   Allergic/Immunologic: Negative.   Neurological: Negative for dizziness and light-headedness.  Hematological: Negative for adenopathy. Bruises/bleeds easily.  Psychiatric/Behavioral: Negative for dysphoric mood and sleep disturbance (sleeping on 2 pillows). The patient is not nervous/anxious.    Vitals:   08/29/18 0956  BP: 114/66  Pulse: 83  Resp: 18  Temp: 98.3 F (36.8 C)  SpO2: 97%  Weight: 231 lb 6 oz (105 kg)  Height: 6'  2" (1.88 m)   Wt Readings from Last 3 Encounters:  08/29/18 231 lb 6 oz (105 kg)  08/17/18 221 lb 3.2 oz (100.3 kg)  07/20/18 236 lb (107 kg)   Lab Results  Component Value Date   CREATININE 1.43 (H) 08/17/2018   CREATININE 1.41 (H) 08/16/2018   CREATININE 1.69 (H) 08/15/2018    Physical Exam Vitals signs and nursing note reviewed.  Constitutional:      Appearance: He is well-developed.  HENT:     Head: Normocephalic and atraumatic.     Ears:     Comments: Completely deaf in both ears Neck:     Musculoskeletal: Normal range of motion and neck supple.  Cardiovascular:     Rate and Rhythm: Normal rate and regular rhythm.  Pulmonary:     Effort: Pulmonary effort is normal.     Breath sounds: Normal breath sounds. No rhonchi or rales.  Abdominal:     Palpations: Abdomen is soft.     Tenderness: There is no abdominal tenderness.  Musculoskeletal:     Right lower leg: He exhibits no tenderness. Edema (1+ pitting) present.     Left lower leg: He exhibits no tenderness. Edema (1+ pitting) present.  Skin:    General: Skin is warm and dry.  Neurological:     Mental Status: He is alert and oriented to person, place, and time.     Motor: No weakness.  Psychiatric:        Mood and Affect: Mood normal.        Behavior: Behavior normal.    Assessment & Plan:  1: Chronic heart failure with reduced ejection fraction- - NYHA class II - euvolemic  - not weighing daily and he was instructed to resume weighing daily and write the weight down so that he can call for an overnight weight gain of >2 pounds or a weekly weight gain of >5 pounds - weight down 2 pounds from last visit here 5 months ago - not adding salt and has been reading food labels. Reviewed the importance of closely following a 2000mg  sodium diet  - drinks 1 cup of coffee and ~ 48 ounces of water daily - once COVID restrictions are lifted, consider changing his losartan to entresto - remains active and still works  Tues-Friday - saw cardiology ([place]) 06/16/2018 - has ICD and saw EP [name]) 07/20/2018 - BNP 08/11/2018 was 316.9  2: HTN- - BP looks good (114/66) - saw PCP Kary Kos) 08/23/2018 - BMP from 08/17/2018 reviewed and showed sodium 140, potassium 4.2, creatinine 1.43 and GFR 46  3: Lymphedema- - stage 2 - has been elevating his legs more often during the day when he's off work  and then when he gets home in the afternoons - patient wearing compression socks but edema persists - limited in his ability to exercise due to his fatigue - will make referral for lymphapress compression boots  Patient did not bring his medications nor a list. Each medication was verbally reviewed with the patient and he was encouraged to bring the bottles to every visit to confirm accuracy of list.  Return in 6 weeks or sooner for any questions/problems before then.   Entire visit was done in the presence of the sign language interpreter

## 2018-08-29 NOTE — Progress Notes (Deleted)
{Choose 1 Note Type (Telehealth Visit or Telephone Visit):[phone]}   Date:  08/29/2018   ID:  Thomas [name], DOB 06-Mar-1935, MRN [id number]  {Patient Location:[id number]::"Home"} {Provider Location:[phone]::"Home"}  PCP:  [name], MD  Cardiologist:  Sanda Klein, MD  Electrophysiologist:  None   Evaluation Performed:  {Choose Visit Type:[phone]::"Follow-Up Visit"}  Chief Complaint:  ***  History of Present Illness:    Thomas [name] is a 84 y.o. male with known history of acute on chronic mixed CHF (EF of 35% to 40%), CAD status post CABG, ischemic cardiomyopathy, paroxysmal atrial fibrillation, hypertension, chronic kidney disease stage III, with other history to include deafness requiring an interpreter, and prostate cancer.  The patient was recently admitted to the hospital with acute on chronic mixed CHF and was diuresed with IV diuretics -7 L and started on Lasix 40 mg daily, he was to continue losartan 25 mg daily Coreg 3.125 mg twice daily, he was taken off of Eliquis during hospitalization in the setting of GI bleeding and history of recent falls.  He was noted to have chronic microcytic anemia secondary to iron deficiency.  Initial chest x-ray did reveal pulmonary edema and small bilateral pleural effusions.  Discharge weight 100.3 kg.  His wife, who is also deaf, called our office through an interpreter on 08/23/2018 stating that the patient had not been taking Lasix, as she was afraid it was going to cause a stroke.  Dr. Sallyanne Kuster advised that he take his Lasix 40 mg daily and gave her reassurance that it does not cause stroke, and warned her that he could decompensate and end up back in the hospital with heart failure if he did not take his medications.  He was advised to call the office if he gained over 3 pounds overnight, with discharge weight of 236 pounds, but wife stated that he weighed 227 pounds at home.  We are seeing him on follow-up concerning his overall  status.      The patient {does/does not:200015} have symptoms concerning for COVID-19 infection (fever, chills, cough, or new shortness of breath).    Past Medical History:  Diagnosis Date  . AICD (automatic cardioverter/defibrillator) present   . Biventricular ICD (implantable cardioverter-defibrillator) in place 03/24/2005   Implantation of a Medtronic Adapta ADDRO1, serial number [id number]  . CHF (congestive heart failure) ([place])   . CKD (chronic kidney disease), stage III ([place])   . Coronary artery disease    a. s/p CABG 1986. b. Multiple PCIs/caths. c. 09/2013: s/p PTCA and BMS to SVG-OM.  [name]   . Dysrhythmia   . History of abdominal aortic aneurysm   . History of bleeding peptic ulcer 1980  . History of epididymitis 2013  . HTN (hypertension)   . Hydronephrosis with ureteropelvic junction obstruction   . Hydroureter on left 2009  . Hypertension   . Ischemic cardiomyopathy    a. Prior EF 30-35%, s/p BIV-ICD. b. 09/2013: EF 45-50%.  . Moderate tricuspid regurgitation   . PAF (paroxysmal atrial fibrillation) ([place])   . Presence of permanent cardiac pacemaker   . Prostate cancer ([place])   . Status post coronary artery bypass grafting 1986   LIMA to the LAD, SVG to OM, SVG to RCA  . Testicular swelling    Past Surgical History:  Procedure Laterality Date  . 2-D echocardiogram  11/20/2011   Ejection fraction 30-35% moderate concentric left ventricular hypertrophy. Left atrium is moderately dilated. Mild MR. Mild or  . BI-VENTRICULAR IMPLANTABLE CARDIOVERTER DEFIBRILLATOR N/A 12/16/2012  Procedure: BI-VENTRICULAR IMPLANTABLE CARDIOVERTER DEFIBRILLATOR  (CRT-D);  Surgeon: [name], MD;  Location[facility] CATH LAB;  Service: Cardiovascular;  Laterality: N/A;  . CARDIAC CATHETERIZATION  12/10/2011   SVG to OM widely patent.  LIMA to LAD patent  . CATARACT EXTRACTION W/PHACO Right 10/12/2017   Procedure: CATARACT EXTRACTION PHACO AND INTRAOCULAR LENS PLACEMENT (IOC);  Surgeon:  Birder Robson, MD;  Location: [facility] ORS;  Service: Ophthalmology;  Laterality: Right;  [place] 00:57 AP% 15.9 CDE 9.07 Fluid pack lot # [id number]  . COLONOSCOPY N/A 07/13/2018   Procedure: COLONOSCOPY;  Surgeon: Toledo, Benay Pike, MD;  Location: [facility] ENDOSCOPY;  Service: Gastroenterology;  Laterality: N/A;  . CORONARY ARTERY BYPASS GRAFT  1986  . ESOPHAGOGASTRODUODENOSCOPY N/A 07/13/2018   Procedure: ESOPHAGOGASTRODUODENOSCOPY (EGD);  Surgeon: Toledo, Benay Pike, MD;  Location: [facility] ENDOSCOPY;  Service: Gastroenterology;  Laterality: N/A;  . ESOPHAGOGASTRODUODENOSCOPY (EGD) WITH PROPOFOL N/A 05/27/2018   Procedure: ESOPHAGOGASTRODUODENOSCOPY (EGD) WITH PROPOFOL;  Surgeon: Clarene Essex, MD;  Location: [place];  Service: Endoscopy;  Laterality: N/A;  . INSERT / REPLACE / REMOVE PACEMAKER    . LEFT HEART CATHETERIZATION WITH CORONARY/GRAFT ANGIOGRAM N/A 12/10/2011   Procedure: LEFT HEART CATHETERIZATION WITH Beatrix Fetters;  Surgeon: Sanda Klein, MD;  Location: [place] CATH LAB;  Service: Cardiovascular;  Laterality: N/A;  . LEFT HEART CATHETERIZATION WITH CORONARY/GRAFT ANGIOGRAM N/A 09/25/2013   Procedure: LEFT HEART CATHETERIZATION WITH Beatrix Fetters;  Surgeon: Blane Ohara, MD;  Location: [facility] CATH LAB;  Service: Cardiovascular;  Laterality: N/A;  . Persantine Myoview  05/06/2010   Post-rest ejection fraction 30%. No significant ischemia demonstrated. Compared to previous study there is no significant change.  . TRANSURETHRAL RESECTION OF PROSTATE     s/p     No outpatient medications have been marked as taking for the 08/30/18 encounter (Appointment) with Lendon Colonel, NP.     Allergies:   Phenazopyridine and Ramipril   Social History   Tobacco Use  . Smoking status: Former Smoker    Quit date: 03/15/1985    Years since quitting: 33.4  . Smokeless tobacco: Never Used  Substance Use Topics  . Alcohol use: No    Comment: occas.  . Drug use: No     Family  Hx: The patient's family history includes Hypertension in his father.  ROS:   Please see the history of present illness.    *** All other systems reviewed and are negative.   Prior CV studies:   The following studies were reviewed today:  ***  Labs/Other Tests and Data Reviewed:    EKG:  {EKG/Telemetry Strips [phone]}  Recent Labs: 05/25/2018: ALT 16 05/26/2018: Magnesium 2.0 08/11/2018: B Natriuretic Peptide 316.9 08/17/2018: BUN 21; Creatinine, Ser 1.43; Hemoglobin 7.8; Platelets 195; Potassium 4.2; Sodium 140   Recent Lipid Panel Lab Results  Component Value Date/Time   CHOL 92 11/01/2015 03:31 AM   CHOL 139 11/20/2011 06:24 AM   TRIG 76 11/01/2015 03:31 AM   TRIG 108 11/20/2011 06:24 AM   HDL 31 (L) 11/01/2015 03:31 AM   HDL 26 (L) 11/20/2011 06:24 AM   CHOLHDL 3.0 11/01/2015 03:31 AM   LDLCALC 46 11/01/2015 03:31 AM   LDLCALC 91 11/20/2011 06:24 AM    Wt Readings from Last 3 Encounters:  08/29/18 231 lb 6 oz (105 kg)  08/17/18 221 lb 3.2 oz (100.3 kg)  07/20/18 236 lb (107 kg)     Objective:    Vital Signs:  There were no vitals taken for this visit.   {  [facility] Virtual Exam (Optional):[phone]::"VITAL SIGNS:  reviewed"}  ASSESSMENT & PLAN:    1. ***  COVID-19 Education: The signs and symptoms of COVID-19 were discussed with the patient and how to seek care for testing (follow up with PCP or arrange E-visit).  ***The importance of social distancing was discussed today.  Time:   Today, I have spent *** minutes with the patient with telehealth technology discussing the above problems.     Medication Adjustments/Labs and Tests Ordered: Current medicines are reviewed at length with the patient today.  Concerns regarding medicines are outlined above.   Tests Ordered: No orders of the defined types were placed in this encounter.   Medication Changes: No orders of the defined types were placed in this encounter.   Disposition:  Follow up  {follow up:15908}  Signed, Phill Myron. [place] Pugh, ANP, AACC  08/29/2018 4:30 PM    [facility]

## 2018-08-29 NOTE — Patient Instructions (Addendum)
Resume weighing daily and call for an overnight weight gain of > 2 pounds or a weekly weight gain of >5 pounds.  Can order zip up compression socks from [employer] or check [employer] in [place] for them

## 2018-08-30 ENCOUNTER — Telehealth: Payer: PPO | Admitting: Adult Health

## 2018-09-04 NOTE — Progress Notes (Signed)
Cardiology Office Note   Date:  09/04/2018   ID:  Thomas [name], Thomas [name] 1934-04-26, MRN [id number]  PCP:  [name], MD  Cardiologist:  Croitoru  Chief Complaint:  Hospital follow up   History of Present Illness: Thomas [name] is a 84 y.o. male who presents for hospital follow-up after admission for decompensated CHF, with history of permanent atrial fibrillation, biventricular ICD Medtronic Viva single-chamber pacemaker placed October 2014, hypertension, CAD status post CABG x3 in 1995, chronic kidney disease, deafness requiring interpreter, and anemia.  He was discharged on 08/17/2018 after admission with generalized weakness bilateral lower extremity edema and increased shortness of breath worsening over 1 month.  The patient also fell twice which was the reason for his presentation to ED.  He was found to be anemic he did receive 1 unit of packed red blood cells on 6 1, with consideration for potential GI bleed however hemoglobin stabilized without melena..  The patient was treated with IV Lasix, and was -7 L during admission.  Chest x-ray did reveal some pulmonary edema and small pleural effusions.  The patient was resumed on carvedilol 3.125 mg twice daily, losartan, and oral Lasix.  Due to falls the patient was taken off of Eliquis and will have this discussion today on this visit.  He is without complaints. He is wearing support hose, avoiding salt and taking his medications as directed. He has seen hematologist today and has had blood drawn, He is supposed to continue iron supplements and transfusions as needed.   Past Medical History:  Diagnosis Date  . AICD (automatic cardioverter/defibrillator) present   . Biventricular ICD (implantable cardioverter-defibrillator) in place 03/24/2005   Implantation of a Medtronic Adapta ADDRO1, serial number [id number]  . CHF (congestive heart failure) ([place])   . CKD (chronic kidney disease), stage III ([place])   . Coronary artery disease    a.  s/p CABG 1986. b. Multiple PCIs/caths. c. 09/2013: s/p PTCA and BMS to SVG-OM.  [name]   . Dysrhythmia   . History of abdominal aortic aneurysm   . History of bleeding peptic ulcer 1980  . History of epididymitis 2013  . HTN (hypertension)   . Hydronephrosis with ureteropelvic junction obstruction   . Hydroureter on left 2009  . Hypertension   . Ischemic cardiomyopathy    a. Prior EF 30-35%, s/p BIV-ICD. b. 09/2013: EF 45-50%.  . Moderate tricuspid regurgitation   . PAF (paroxysmal atrial fibrillation) ([place])   . Presence of permanent cardiac pacemaker   . Prostate cancer ([place])   . Status post coronary artery bypass grafting 1986   LIMA to the LAD, SVG to OM, SVG to RCA  . Testicular swelling     Past Surgical History:  Procedure Laterality Date  . 2-D echocardiogram  11/20/2011   Ejection fraction 30-35% moderate concentric left ventricular hypertrophy. Left atrium is moderately dilated. Mild MR. Mild or  . BI-VENTRICULAR IMPLANTABLE CARDIOVERTER DEFIBRILLATOR N/A 12/16/2012   Procedure: BI-VENTRICULAR IMPLANTABLE CARDIOVERTER DEFIBRILLATOR  (CRT-D);  Surgeon: [name], MD;  Location: [facility] CATH LAB;  Service: Cardiovascular;  Laterality: N/A;  . CARDIAC CATHETERIZATION  12/10/2011   SVG to OM widely patent.  LIMA to LAD patent  . CATARACT EXTRACTION W/PHACO Right 10/12/2017   Procedure: CATARACT EXTRACTION PHACO AND INTRAOCULAR LENS PLACEMENT (IOC);  Surgeon: Birder Robson, MD;  Location: [facility] ORS;  Service: Ophthalmology;  Laterality: Right;  [place] 00:57 AP% 15.9 CDE 9.07 Fluid pack lot # [id number]  . COLONOSCOPY N/A 07/13/2018  Procedure: COLONOSCOPY;  Surgeon: Toledo, Benay Pike, MD;  Location: [facility] ENDOSCOPY;  Service: Gastroenterology;  Laterality: N/A;  . CORONARY ARTERY BYPASS GRAFT  1986  . ESOPHAGOGASTRODUODENOSCOPY N/A 07/13/2018   Procedure: ESOPHAGOGASTRODUODENOSCOPY (EGD);  Surgeon: Toledo, Benay Pike, MD;  Location: [facility] ENDOSCOPY;  Service: Gastroenterology;   Laterality: N/A;  . ESOPHAGOGASTRODUODENOSCOPY (EGD) WITH PROPOFOL N/A 05/27/2018   Procedure: ESOPHAGOGASTRODUODENOSCOPY (EGD) WITH PROPOFOL;  Surgeon: Clarene Essex, MD;  Location: [place];  Service: Endoscopy;  Laterality: N/A;  . INSERT / REPLACE / REMOVE PACEMAKER    . LEFT HEART CATHETERIZATION WITH CORONARY/GRAFT ANGIOGRAM N/A 12/10/2011   Procedure: LEFT HEART CATHETERIZATION WITH Beatrix Fetters;  Surgeon: Sanda Klein, MD;  Location: [facility] CATH LAB;  Service: Cardiovascular;  Laterality: N/A;  . LEFT HEART CATHETERIZATION WITH CORONARY/GRAFT ANGIOGRAM N/A 09/25/2013   Procedure: LEFT HEART CATHETERIZATION WITH Beatrix Fetters;  Surgeon: Blane Ohara, MD;  Location: [facility] CATH LAB;  Service: Cardiovascular;  Laterality: N/A;  . Persantine Myoview  05/06/2010   Post-rest ejection fraction 30%. No significant ischemia demonstrated. Compared to previous study there is no significant change.  . TRANSURETHRAL RESECTION OF PROSTATE     s/p     Current Outpatient Medications  Medication Sig Dispense Refill  . albuterol (VENTOLIN HFA) 108 (90 Base) MCG/ACT inhaler Inhale 2 puffs into the lungs 4 (four) times daily as needed. (Patient taking differently: Inhale 2 puffs into the lungs 4 (four) times daily as needed for wheezing or shortness of breath. ) 1 Inhaler 1  . carvedilol (COREG) 3.125 MG tablet Take 1 tablet (3.125 mg total) by mouth 2 (two) times daily with a meal. 60 tablet 0  . ferrous sulfate 325 (65 FE) MG tablet Take 1 tablet (325 mg total) by mouth daily for 30 days. 30 tablet 0  . furosemide (LASIX) 40 MG tablet Take 1 tablet (40 mg total) by mouth daily. 90 tablet 3  . HYDROCORTISONE, TOPICAL, 2 % LOTN Apply 40 oz topically 2 (two) times daily as needed (as needed for itching on foot). APPLY BID TO AFFECTED AREA 1 Bottle 3  . isosorbide mononitrate (IMDUR) 30 MG 24 hr tablet Take 1 tablet (30 mg total) by mouth daily. 30 tablet 0  . losartan (COZAAR) 25 MG  tablet Take 1 tablet (25 mg total) by mouth daily. 30 tablet 0  . pantoprazole (PROTONIX) 40 MG tablet Take 1 tablet (40 mg total) by mouth daily for 30 days. 30 tablet 0  . simvastatin (ZOCOR) 20 MG tablet Take 1 tablet (20 mg total) by mouth daily. 90 tablet 2  . vitamin B-12 (CYANOCOBALAMIN) 500 MCG tablet Take 1 tablet (500 mcg total) by mouth daily. 90 tablet 1   No current facility-administered medications for this visit.     Allergies:   Phenazopyridine and Ramipril    Social History:  The patient  reports that he quit smoking about 33 years ago. He has never used smokeless tobacco. He reports that he does not drink alcohol or use drugs.   Family History:  The patient's family history includes Hypertension in his father.    ROS: All other systems are reviewed and negative. Unless otherwise mentioned in H&P    PHYSICAL EXAM: VS:  There were no vitals taken for this visit. , BMI There is no height or weight on file to calculate BMI. GEN: Well nourished, well developed, in no acute distress HEENT: normal Neck: no JVD, carotid bruits, or masses Cardiac: RRR; 54/2 holosystolic murmur, rubs, or gallops,depnedent  edema wearing support hose.  Respiratory:  Clear to auscultation bilaterally, normal work of breathing GI: soft, nontender, nondistended, + BS MS: no deformity or atrophy Skin: warm and dry, no rash Neuro:  Strength and sensation are intact, except for deafness requiring interpretor  Psych: euthymic mood, full affect   EKG:  EKG is not ordered today   Recent Labs: 05/25/2018: ALT 16 05/26/2018: Magnesium 2.0 08/11/2018: B Natriuretic Peptide 316.9 08/17/2018: BUN 21; Creatinine, Ser 1.43; Hemoglobin 7.8; Platelets 195; Potassium 4.2; Sodium 140    Lipid Panel    Component Value Date/Time   CHOL 92 11/01/2015 0331   CHOL 139 11/20/2011 0624   TRIG 76 11/01/2015 0331   TRIG 108 11/20/2011 0624   HDL 31 (L) 11/01/2015 0331   HDL 26 (L) 11/20/2011 0624   CHOLHDL  3.0 11/01/2015 0331   VLDL 15 11/01/2015 0331   VLDL 22 11/20/2011 0624   LDLCALC 46 11/01/2015 0331   LDLCALC 91 11/20/2011 0624      Wt Readings from Last 3 Encounters:  08/29/18 231 lb 6 oz (105 kg)  08/17/18 221 lb 3.2 oz (100.3 kg)  07/20/18 236 lb (107 kg)      Other studies Reviewed: Echocardiogram 30-May-2018 1. The left ventricle has moderately reduced systolic function, with an ejection fraction of 35-40%. The cavity size was moderately dilated. Left ventricular diastology could not be evaluated.  2. The right ventricle has mildly reduced systolic function. The cavity was moderately enlarged. There is no increase in right ventricular wall thickness.  3. Left atrial size was severely dilated.  4. Right atrial size was severely dilated.  5. The mitral valve is normal in structure. Mitral valve regurgitation is mild to moderate by color flow Doppler. The MR jet is eccentric posteriorly directed.  6. The tricuspid valve is normal in structure.  7. The aortic valve is tricuspid Aortic valve regurgitation is mild by color flow Doppler.  8. The pulmonic valve was normal in structure.  9. There is moderate dilatation of the aortic root and of the ascending aorta. 10. Moderately dilated pulmonary artery. 11. Pulmonary hypertension is moderate. 12. The inferior vena cava was dilated in size with <50% respiratory variability. 13. Right atrial pressure is estimated at 15 mmHg.  ICD Check 07/20/2018 ICD check in clinic. Normal device function. Thresholds and sensing consistent with previous device measurements. Impedance trends stable over time. No evidence of any ventricular arrhythmias. Histogram distribution appropriate for patient and level of  activity. No changes made this session. Device programmed at appropriate safety margins. Estimated longevity 12 months   ASSESSMENT AND PLAN:  1.  NICM: Last echo revealed EF of 35%-40% in 04/2018. He is to continue current medication  regimen with coreg, lasix, and losartan. BP is well controlled currently.   2. Chronic Systolic CHF:  He is maintaining his weight, weighing daily, and avoiding salty foods. He is still having some dependent edema, but is wearing support hose. Legs look better first thing in the morning but have had some swelling later in the day. He denies pain or DO/E at this time. Will see him every 3-4 months for close follow up of his status.   3. Anemia: Receiving iron supplements and has had a blood transfusion during recent hospitalization. He was seen today by hematologist with labs drawn.   4. AICD in situ: Last check on 07/20/2018 with no evidence of ventricular arrhythmias. Continue to have interrogations per protocol.    Current medicines are reviewed at  length with the patient today.    Labs/ tests ordered today include: None  Phill Myron. West Pugh, ANP, [facility]   09/04/2018 8:48 AM    [facility] [address] Office [phone] Fax [phone]

## 2018-09-05 ENCOUNTER — Ambulatory Visit (INDEPENDENT_AMBULATORY_CARE_PROVIDER_SITE_OTHER): Payer: PPO | Admitting: Adult Health

## 2018-09-05 ENCOUNTER — Encounter: Payer: Self-pay | Admitting: Adult Health

## 2018-09-05 ENCOUNTER — Other Ambulatory Visit: Payer: Self-pay

## 2018-09-05 VITALS — BP 120/66 | HR 44 | Ht 74.0 in | Wt 231.2 lb

## 2018-09-05 DIAGNOSIS — I10 Essential (primary) hypertension: Secondary | ICD-10-CM

## 2018-09-05 DIAGNOSIS — Z23 Encounter for immunization: Secondary | ICD-10-CM | POA: Diagnosis not present

## 2018-09-05 DIAGNOSIS — I5032 Chronic diastolic (congestive) heart failure: Secondary | ICD-10-CM | POA: Diagnosis not present

## 2018-09-05 DIAGNOSIS — K922 Gastrointestinal hemorrhage, unspecified: Secondary | ICD-10-CM

## 2018-09-05 DIAGNOSIS — D500 Iron deficiency anemia secondary to blood loss (chronic): Secondary | ICD-10-CM

## 2018-09-05 DIAGNOSIS — D649 Anemia, unspecified: Secondary | ICD-10-CM | POA: Diagnosis not present

## 2018-09-05 DIAGNOSIS — Z95810 Presence of automatic (implantable) cardiac defibrillator: Secondary | ICD-10-CM

## 2018-09-05 DIAGNOSIS — Z0000 Encounter for general adult medical examination without abnormal findings: Secondary | ICD-10-CM | POA: Diagnosis not present

## 2018-09-05 NOTE — Patient Instructions (Signed)
Follow-Up: You will need a follow up appointment in 4 months.  Please call our office 2 months in advance,AUGUST 2020 to schedule this, October 2020 appointment.  You may see Sanda Klein, MD Jory Sims, DNP, [place] or one of the following Advanced Practice Providers on your designated Care Team:  Almyra Deforest,  PA-C  Fabian Sharp, [place]        Medication Instructions:  NO CHANGES- Your physician recommends that you continue on your current medications as directed. Please refer to the Current Medication list given to you today. If you need a refill on your cardiac medications before your next appointment, please call your pharmacy. Labwork: When you have labs (blood work) and your tests are completely normal, you will receive your results ONLY by [place] (if you have MyChart) -OR- A paper copy in the mail.  At [facility], you and your health needs are our priority.  As part of our continuing mission to provide you with exceptional heart care, we have created designated Provider Care Teams.  These Care Teams include your primary Cardiologist (physician) and Advanced Practice Providers (APPs -  Physician Assistants and Nurse Practitioners) who all work together to provide you with the care you need, when you need it.  Thank you for choosing [facility] at [facility]!!

## 2018-09-08 DIAGNOSIS — D508 Other iron deficiency anemias: Secondary | ICD-10-CM | POA: Diagnosis not present

## 2018-09-08 DIAGNOSIS — K31811 Angiodysplasia of stomach and duodenum with bleeding: Secondary | ICD-10-CM | POA: Diagnosis not present

## 2018-09-08 DIAGNOSIS — R195 Other fecal abnormalities: Secondary | ICD-10-CM | POA: Diagnosis not present

## 2018-09-13 ENCOUNTER — Other Ambulatory Visit: Payer: Self-pay

## 2018-09-13 ENCOUNTER — Encounter: Payer: Self-pay | Admitting: Emergency Medicine

## 2018-09-13 ENCOUNTER — Inpatient Hospital Stay
Admission: EM | Admit: 2018-09-13 | Discharge: 2018-09-16 | DRG: 378 | Disposition: A | Discharge status: Home / Self Care | Payer: PPO | Attending: Internal Medicine | Admitting: Internal Medicine

## 2018-09-13 DIAGNOSIS — I255 Ischemic cardiomyopathy: Secondary | ICD-10-CM | POA: Diagnosis present

## 2018-09-13 DIAGNOSIS — Z8546 Personal history of malignant neoplasm of prostate: Secondary | ICD-10-CM | POA: Diagnosis not present

## 2018-09-13 DIAGNOSIS — Z9861 Coronary angioplasty status: Secondary | ICD-10-CM | POA: Diagnosis not present

## 2018-09-13 DIAGNOSIS — K2941 Chronic atrophic gastritis with bleeding: Secondary | ICD-10-CM | POA: Diagnosis present

## 2018-09-13 DIAGNOSIS — I10 Essential (primary) hypertension: Secondary | ICD-10-CM | POA: Diagnosis present

## 2018-09-13 DIAGNOSIS — K3189 Other diseases of stomach and duodenum: Secondary | ICD-10-CM | POA: Diagnosis not present

## 2018-09-13 DIAGNOSIS — I959 Hypotension, unspecified: Secondary | ICD-10-CM | POA: Diagnosis present

## 2018-09-13 DIAGNOSIS — Z951 Presence of aortocoronary bypass graft: Secondary | ICD-10-CM | POA: Diagnosis not present

## 2018-09-13 DIAGNOSIS — D649 Anemia, unspecified: Secondary | ICD-10-CM | POA: Diagnosis not present

## 2018-09-13 DIAGNOSIS — Z8673 Personal history of transient ischemic attack (TIA), and cerebral infarction without residual deficits: Secondary | ICD-10-CM | POA: Diagnosis not present

## 2018-09-13 DIAGNOSIS — K31811 Angiodysplasia of stomach and duodenum with bleeding: Principal | ICD-10-CM | POA: Diagnosis present

## 2018-09-13 DIAGNOSIS — Z79899 Other long term (current) drug therapy: Secondary | ICD-10-CM

## 2018-09-13 DIAGNOSIS — Z95810 Presence of automatic (implantable) cardiac defibrillator: Secondary | ICD-10-CM | POA: Diagnosis not present

## 2018-09-13 DIAGNOSIS — K922 Gastrointestinal hemorrhage, unspecified: Secondary | ICD-10-CM | POA: Diagnosis present

## 2018-09-13 DIAGNOSIS — I5022 Chronic systolic (congestive) heart failure: Secondary | ICD-10-CM | POA: Diagnosis present

## 2018-09-13 DIAGNOSIS — N183 Chronic kidney disease, stage 3 (moderate): Secondary | ICD-10-CM | POA: Diagnosis present

## 2018-09-13 DIAGNOSIS — D62 Acute posthemorrhagic anemia: Secondary | ICD-10-CM | POA: Diagnosis present

## 2018-09-13 DIAGNOSIS — Z03818 Encounter for observation for suspected exposure to other biological agents ruled out: Secondary | ICD-10-CM | POA: Diagnosis not present

## 2018-09-13 DIAGNOSIS — K449 Diaphragmatic hernia without obstruction or gangrene: Secondary | ICD-10-CM | POA: Diagnosis present

## 2018-09-13 DIAGNOSIS — I130 Hypertensive heart and chronic kidney disease with heart failure and stage 1 through stage 4 chronic kidney disease, or unspecified chronic kidney disease: Secondary | ICD-10-CM | POA: Diagnosis present

## 2018-09-13 DIAGNOSIS — K31819 Angiodysplasia of stomach and duodenum without bleeding: Secondary | ICD-10-CM | POA: Diagnosis not present

## 2018-09-13 DIAGNOSIS — K219 Gastro-esophageal reflux disease without esophagitis: Secondary | ICD-10-CM | POA: Diagnosis present

## 2018-09-13 DIAGNOSIS — Z9079 Acquired absence of other genital organ(s): Secondary | ICD-10-CM | POA: Diagnosis not present

## 2018-09-13 DIAGNOSIS — I480 Paroxysmal atrial fibrillation: Secondary | ICD-10-CM | POA: Diagnosis present

## 2018-09-13 DIAGNOSIS — E785 Hyperlipidemia, unspecified: Secondary | ICD-10-CM | POA: Diagnosis present

## 2018-09-13 DIAGNOSIS — Z1159 Encounter for screening for other viral diseases: Secondary | ICD-10-CM | POA: Diagnosis not present

## 2018-09-13 DIAGNOSIS — K2981 Duodenitis with bleeding: Secondary | ICD-10-CM | POA: Diagnosis present

## 2018-09-13 DIAGNOSIS — I2510 Atherosclerotic heart disease of native coronary artery without angina pectoris: Secondary | ICD-10-CM | POA: Diagnosis present

## 2018-09-13 DIAGNOSIS — I252 Old myocardial infarction: Secondary | ICD-10-CM

## 2018-09-13 DIAGNOSIS — Z87891 Personal history of nicotine dependence: Secondary | ICD-10-CM

## 2018-09-13 DIAGNOSIS — K2980 Duodenitis without bleeding: Secondary | ICD-10-CM | POA: Diagnosis not present

## 2018-09-13 DIAGNOSIS — Z888 Allergy status to other drugs, medicaments and biological substances status: Secondary | ICD-10-CM | POA: Diagnosis not present

## 2018-09-13 DIAGNOSIS — Z8679 Personal history of other diseases of the circulatory system: Secondary | ICD-10-CM

## 2018-09-13 DIAGNOSIS — I071 Rheumatic tricuspid insufficiency: Secondary | ICD-10-CM | POA: Diagnosis present

## 2018-09-13 DIAGNOSIS — K222 Esophageal obstruction: Secondary | ICD-10-CM | POA: Diagnosis present

## 2018-09-13 DIAGNOSIS — Z8711 Personal history of peptic ulcer disease: Secondary | ICD-10-CM | POA: Diagnosis not present

## 2018-09-13 DIAGNOSIS — K2940 Chronic atrophic gastritis without bleeding: Secondary | ICD-10-CM | POA: Diagnosis not present

## 2018-09-13 DIAGNOSIS — K279 Peptic ulcer, site unspecified, unspecified as acute or chronic, without hemorrhage or perforation: Secondary | ICD-10-CM | POA: Diagnosis not present

## 2018-09-13 DIAGNOSIS — I5043 Acute on chronic combined systolic (congestive) and diastolic (congestive) heart failure: Secondary | ICD-10-CM | POA: Diagnosis not present

## 2018-09-13 DIAGNOSIS — H9193 Unspecified hearing loss, bilateral: Secondary | ICD-10-CM | POA: Diagnosis present

## 2018-09-13 DIAGNOSIS — Z8249 Family history of ischemic heart disease and other diseases of the circulatory system: Secondary | ICD-10-CM

## 2018-09-13 LAB — COMPREHENSIVE METABOLIC PANEL
ALT: 14 U/L (ref 0–44)
AST: 22 U/L (ref 15–41)
Albumin: 2.6 g/dL — ABNORMAL LOW (ref 3.5–5.0)
Alkaline Phosphatase: 90 U/L (ref 38–126)
Anion gap: 4 — ABNORMAL LOW (ref 5–15)
BUN: 32 mg/dL — ABNORMAL HIGH (ref 8–23)
CO2: 27 mmol/L (ref 22–32)
Calcium: 8.2 mg/dL — ABNORMAL LOW (ref 8.9–10.3)
Chloride: 112 mmol/L — ABNORMAL HIGH (ref 98–111)
Creatinine, Ser: 1.17 mg/dL (ref 0.61–1.24)
GFR calc Af Amer: >60 mL/min (ref >60)
GFR calc non Af Amer: 58 mL/min — ABNORMAL LOW (ref >60)
Glucose, Bld: 111 mg/dL — ABNORMAL HIGH (ref 70–99)
Potassium: 4 mmol/L (ref 3.5–5.1)
Sodium: 143 mmol/L (ref 135–145)
Total Bilirubin: 0.6 mg/dL (ref 0.3–1.2)
Total Protein: 5 g/dL — ABNORMAL LOW (ref 6.5–8.1)

## 2018-09-13 LAB — CBC
HCT: 26.7 % — ABNORMAL LOW (ref 39.0–52.0)
Hemoglobin: 7.7 g/dL — ABNORMAL LOW (ref 13.0–17.0)
MCH: 23.2 pg — ABNORMAL LOW (ref 26.0–34.0)
MCHC: 28.8 g/dL — ABNORMAL LOW (ref 30.0–36.0)
MCV: 80.4 fL (ref 80.0–100.0)
Platelets: 227 10*3/uL (ref 150–400)
RBC: 3.32 MIL/uL — ABNORMAL LOW (ref 4.22–5.81)
RDW: 21.5 % — ABNORMAL HIGH (ref 11.5–15.5)
WBC: 5.7 10*3/uL (ref 4.0–10.5)
nRBC: 0 % (ref 0.0–0.2)

## 2018-09-13 LAB — SARS CORONAVIRUS 2 BY RT PCR (HOSPITAL ORDER, PERFORMED IN ~~LOC~~ HOSPITAL LAB): SARS Coronavirus 2: NEGATIVE

## 2018-09-13 MED ORDER — SODIUM CHLORIDE 0.9 % IV SOLN
80.0000 mg | Freq: Once | INTRAVENOUS | Status: AC
  Administered 2018-09-13: 80 mg via INTRAVENOUS
  Filled 2018-09-13: qty 80

## 2018-09-13 MED ORDER — SODIUM CHLORIDE 0.9 % IV SOLN
10.0000 mL/h | Freq: Once | INTRAVENOUS | Status: AC
  Administered 2018-09-14: 10 mL/h via INTRAVENOUS

## 2018-09-13 NOTE — ED Notes (Signed)
Consent for blood product obtained. Awaiting Protonix from pharmacy, unable to retrieve from pysis. Blood to be transfused when ready. Patient swabbed for covid. Awaiting bed status.

## 2018-09-13 NOTE — ED Notes (Addendum)
ED TO INPATIENT HANDOFF REPORT  ED Nurse Name and Phone #: Karena Addison [phone]  S Name/Age/Gender Thomas [name] 84 y.o. male Room/Bed: ED14A/ED14A  Code Status   Code Status: Prior  Home/SNF/Other Home Patient oriented to: self, place, time and situation Is this baseline? Yes   Triage Complete: Triage complete  Chief Complaint Abd pain  Triage Note ALS interpreter used for triage. Per wife, patient was seen at [facility] last Tuesday and had endoscopy done at [facility] last week. Patient was called by GI and instructed to come to ED due to low blood count and detection of GI bleed. Patient reports weakness x2 weeks. States he has had dark stools for several days.    Allergies Allergies  Allergen Reactions  . Phenazopyridine Nausea Only and Other (See Comments)    GI UPSET  . Ramipril Other (See Comments)    unk Other reaction(s): Other (See Comments), Unknown unk    Level of Care/Admitting Diagnosis ED Disposition    ED Disposition Condition Comment   Admit  The patient appears reasonably stabilized for admission considering the current resources, flow, and capabilities available in the ED at this time, and I doubt any other [facility] requiring further screening and/or treatment in the ED prior to admission is  present.       B Medical/Surgery History Past Medical History:  Diagnosis Date  . AICD (automatic cardioverter/defibrillator) present   . Biventricular ICD (implantable cardioverter-defibrillator) in place 03/24/2005   Implantation of a Medtronic Adapta ADDRO1, serial number [id number]  . CHF (congestive heart failure) ([place])   . CKD (chronic kidney disease), stage III ([place])   . Coronary artery disease    a. s/p CABG 1986. b. Multiple PCIs/caths. c. 09/2013: s/p PTCA and BMS to SVG-OM.  [name]   . Dysrhythmia   . History of abdominal aortic aneurysm   . History of bleeding peptic ulcer 1980  . History of epididymitis 2013  . HTN (hypertension)   . Hydronephrosis with  ureteropelvic junction obstruction   . Hydroureter on left 2009  . Hypertension   . Ischemic cardiomyopathy    a. Prior EF 30-35%, s/p BIV-ICD. b. 09/2013: EF 45-50%.  . Moderate tricuspid regurgitation   . PAF (paroxysmal atrial fibrillation) ([place])   . Presence of permanent cardiac pacemaker   . Prostate cancer ([place])   . Status post coronary artery bypass grafting 1986   LIMA to the LAD, SVG to OM, SVG to RCA  . Testicular swelling    Past Surgical History:  Procedure Laterality Date  . 2-D echocardiogram  11/20/2011   Ejection fraction 30-35% moderate concentric left ventricular hypertrophy. Left atrium is moderately dilated. Mild MR. Mild or  . BI-VENTRICULAR IMPLANTABLE CARDIOVERTER DEFIBRILLATOR N/A 12/16/2012   Procedure: BI-VENTRICULAR IMPLANTABLE CARDIOVERTER DEFIBRILLATOR  (CRT-D);  Surgeon: [name], MD;  Location: [facility] CATH LAB;  Service: Cardiovascular;  Laterality: N/A;  . CARDIAC CATHETERIZATION  12/10/2011   SVG to OM widely patent.  LIMA to LAD patent  . CATARACT EXTRACTION W/PHACO Right 10/12/2017   Procedure: CATARACT EXTRACTION PHACO AND INTRAOCULAR LENS PLACEMENT (IOC);  Surgeon: Birder Robson, MD;  Location: [facility] ORS;  Service: Ophthalmology;  Laterality: Right;  [place] 00:57 AP% 15.9 CDE 9.07 Fluid pack lot # [id number]  . COLONOSCOPY N/A 07/13/2018   Procedure: COLONOSCOPY;  Surgeon: Toledo, Benay Pike, MD;  Location: [facility] ENDOSCOPY;  Service: Gastroenterology;  Laterality: N/A;  . CORONARY ARTERY BYPASS GRAFT  1986  . ESOPHAGOGASTRODUODENOSCOPY N/A 07/13/2018   Procedure:  ESOPHAGOGASTRODUODENOSCOPY (EGD);  Surgeon: Toledo, Benay Pike, MD;  Location: [facility] ENDOSCOPY;  Service: Gastroenterology;  Laterality: N/A;  . ESOPHAGOGASTRODUODENOSCOPY (EGD) WITH PROPOFOL N/A 05/27/2018   Procedure: ESOPHAGOGASTRODUODENOSCOPY (EGD) WITH PROPOFOL;  Surgeon: Clarene Essex, MD;  Location: [place];  Service: Endoscopy;  Laterality: N/A;  . INSERT / REPLACE / REMOVE PACEMAKER     . LEFT HEART CATHETERIZATION WITH CORONARY/GRAFT ANGIOGRAM N/A 12/10/2011   Procedure: LEFT HEART CATHETERIZATION WITH Beatrix Fetters;  Surgeon: Sanda Klein, MD;  Location: [place] CATH LAB;  Service: Cardiovascular;  Laterality: N/A;  . LEFT HEART CATHETERIZATION WITH CORONARY/GRAFT ANGIOGRAM N/A 09/25/2013   Procedure: LEFT HEART CATHETERIZATION WITH Beatrix Fetters;  Surgeon: Blane Ohara, MD;  Location: [facility] CATH LAB;  Service: Cardiovascular;  Laterality: N/A;  . Persantine Myoview  05/06/2010   Post-rest ejection fraction 30%. No significant ischemia demonstrated. Compared to previous study there is no significant change.  . TRANSURETHRAL RESECTION OF PROSTATE     s/p     A IV Location/Drains/Wounds Patient Lines/Drains/Airways Status   Active Line/Drains/Airways    Name:   Placement date:   Placement time:   Site:   Days:   Peripheral IV 09/13/18 Left Antecubital   09/13/18    2241    Antecubital   less than 1          Intake/Output Last 24 hours No intake or output data in the 24 hours ending 09/13/18 2339  Labs/Imaging Results for orders placed or performed during the hospital encounter of 09/13/18 (from the past 48 hour(s))  Comprehensive metabolic panel     Status: Abnormal   Collection Time: 09/13/18  6:35 PM  Result Value Ref Range   Sodium 143 135 - 145 mmol/L   Potassium 4.0 3.5 - 5.1 mmol/L   Chloride 112 (H) 98 - 111 mmol/L   CO2 27 22 - 32 mmol/L   Glucose, Bld 111 (H) 70 - 99 mg/dL   BUN 32 (H) 8 - 23 mg/dL   Creatinine, Ser 1.17 0.61 - 1.24 mg/dL   Calcium 8.2 (L) 8.9 - 10.3 mg/dL   Total Protein 5.0 (L) 6.5 - 8.1 g/dL   Albumin 2.6 (L) 3.5 - 5.0 g/dL   AST 22 15 - 41 U/L   ALT 14 0 - 44 U/L   Alkaline Phosphatase 90 38 - 126 U/L   Total Bilirubin 0.6 0.3 - 1.2 mg/dL   GFR calc non Af Amer 58 (L) >60 mL/min   GFR calc Af Amer >60 >60 mL/min   Anion gap 4 (L) 5 - 15    Comment: Performed at [facility], [address] [place], [place] [address]  CBC     Status: Abnormal   Collection Time: 09/13/18  6:35 PM  Result Value Ref Range   WBC 5.7 4.0 - 10.5 K/uL   RBC 3.32 (L) 4.22 - 5.81 MIL/uL   Hemoglobin 7.7 (L) 13.0 - 17.0 g/dL   HCT 26.7 (L) 39.0 - 52.0 %   MCV 80.4 80.0 - 100.0 fL   MCH 23.2 (L) 26.0 - 34.0 pg   MCHC 28.8 (L) 30.0 - 36.0 g/dL   RDW 21.5 (H) 11.5 - 15.5 %   Platelets 227 150 - 400 K/uL   nRBC 0.0 0.0 - 0.2 %    Comment: Performed at [facility], [address] [place], [place] [address]  Type and screen [place]     Status: None (Preliminary result)   Collection Time:  09/13/18  6:35 PM  Result Value Ref Range   ABO/RH(D) O POS    Antibody Screen POS    Sample Expiration      09/16/2018,2359 Performed at [facility], [address] [place], [place] [address]    Antibody Identification PENDING    No results found.  Pending Labs Unresulted Labs (From admission, onward)    Start     Ordered   09/13/18 2154  Prepare RBC  (Adult Blood Administration - PRBC)  Once,   R    Question Answer Comment  # of Units 2 units   Transfusion Indications Symptomatic Anemia   Transfusion Indications Actively Bleeding / GI Bleed   If emergent release call blood bank Not emergent release      09/13/18 2153   09/13/18 2154  SARS Coronavirus 2 (CEPHEID- Performed in [place] hospital lab), Hosp Order  (Symptomatic Patients Labs with Precautions )  ONCE - STAT,   STAT     09/13/18 2153          Vitals/Pain Today's Vitals   09/13/18 2202 09/13/18 2204 09/13/18 2230 09/13/18 2300  BP: (!) 84/65 125/60 110/64 127/71  Pulse: 68  60 61  Resp: 17  (!) 25 (!) 22  Temp: 98.6 F (37 C)     TempSrc: Oral     SpO2: 97%  96% 97%  Weight:      Height:      PainSc: 0-No pain       Isolation Precautions Airborne and Contact precautions  Medications Medications  0.9 %  sodium chloride infusion (has no administration in time range)   pantoprazole (PROTONIX) 80 mg in sodium chloride 0.9 % 100 mL IVPB (0 mg Intravenous Stopped 09/13/18 2318)    Mobility walks Low fall risk   Focused Assessments    R Recommendations: See Admitting Provider Note  Report given to: Phineas Real, RN

## 2018-09-13 NOTE — ED Notes (Signed)
protonix drip started. Vss. Awaiting admission, transfusion and bed status.

## 2018-09-13 NOTE — ED Provider Notes (Signed)
Borderline patient does appear pale.  Anticipate patient will require transfusion.  Will consult with GI for further recommendations.  [facility] Emergency Department Provider Note    First MD Initiated Contact with Patient 09/13/18 2051     (approximate)  I have reviewed the triage vital signs and the nursing notes.   HISTORY  Chief Complaint GI Bleeding    HPI Thomas [name] is a 84 y.o. male post extensive past medical history presents the ER after had abnormal outpatient bleeding Stool study showing evidence of duodenal and gastric bleeding.  Patient with persistent melena but is also complaining of worsening fatigue weakness and exertional dyspnea.  He was recently admitted to [facility] ([place]) was taken off of his Eliquis.  Does not take any anticoagulation at this time.  Was having persistent bleeding.  Denies any history of cirrhosis.    Past Medical History:  Diagnosis Date  . AICD (automatic cardioverter/defibrillator) present   . Biventricular ICD (implantable cardioverter-defibrillator) in place 03/24/2005   Implantation of a Medtronic Adapta ADDRO1, serial number [id number]  . CHF (congestive heart failure) ([place])   . CKD (chronic kidney disease), stage III ([place])   . Coronary artery disease    a. s/p CABG 1986. b. Multiple PCIs/caths. c. 09/2013: s/p PTCA and BMS to SVG-OM.  [name]   . Dysrhythmia   . History of abdominal aortic aneurysm   . History of bleeding peptic ulcer 1980  . History of epididymitis 2013  . HTN (hypertension)   . Hydronephrosis with ureteropelvic junction obstruction   . Hydroureter on left 2009  . Hypertension   . Ischemic cardiomyopathy    a. Prior EF 30-35%, s/p BIV-ICD. b. 09/2013: EF 45-50%.  . Moderate tricuspid regurgitation   . PAF (paroxysmal atrial fibrillation) ([place])   . Presence of permanent cardiac pacemaker   . Prostate cancer ([place])   . Status post coronary artery bypass grafting 1986   LIMA to the  LAD, SVG to OM, SVG to RCA  . Testicular swelling    Family History  Problem Relation Age of Onset  . Hypertension Father    Past Surgical History:  Procedure Laterality Date  . 2-D echocardiogram  11/20/2011   Ejection fraction 30-35% moderate concentric left ventricular hypertrophy. Left atrium is moderately dilated. Mild MR. Mild or  . BI-VENTRICULAR IMPLANTABLE CARDIOVERTER DEFIBRILLATOR N/A 12/16/2012   Procedure: BI-VENTRICULAR IMPLANTABLE CARDIOVERTER DEFIBRILLATOR  (CRT-D);  Surgeon: [name], MD;  Location: [facility] CATH LAB;  Service: Cardiovascular;  Laterality: N/A;  . CARDIAC CATHETERIZATION  12/10/2011   SVG to OM widely patent.  LIMA to LAD patent  . CATARACT EXTRACTION W/PHACO Right 10/12/2017   Procedure: CATARACT EXTRACTION PHACO AND INTRAOCULAR LENS PLACEMENT (IOC);  Surgeon: Birder Robson, MD;  Location: [facility] ORS;  Service: Ophthalmology;  Laterality: Right;  [place] 00:57 AP% 15.9 CDE 9.07 Fluid pack lot # [id number]  . COLONOSCOPY N/A 07/13/2018   Procedure: COLONOSCOPY;  Surgeon: Toledo, Benay Pike, MD;  Location: [facility] ENDOSCOPY;  Service: Gastroenterology;  Laterality: N/A;  . CORONARY ARTERY BYPASS GRAFT  1986  . ESOPHAGOGASTRODUODENOSCOPY N/A 07/13/2018   Procedure: ESOPHAGOGASTRODUODENOSCOPY (EGD);  Surgeon: Toledo, Benay Pike, MD;  Location: [facility] ENDOSCOPY;  Service: Gastroenterology;  Laterality: N/A;  . ESOPHAGOGASTRODUODENOSCOPY (EGD) WITH PROPOFOL N/A 05/27/2018   Procedure: ESOPHAGOGASTRODUODENOSCOPY (EGD) WITH PROPOFOL;  Surgeon: Clarene Essex, MD;  Location: [place];  Service: Endoscopy;  Laterality: N/A;  . INSERT / REPLACE / REMOVE PACEMAKER    . LEFT HEART  CATHETERIZATION WITH CORONARY/GRAFT ANGIOGRAM N/A 12/10/2011   Procedure: LEFT HEART CATHETERIZATION WITH Beatrix Fetters;  Surgeon: Sanda Klein, MD;  Location: [place] CATH LAB;  Service: Cardiovascular;  Laterality: N/A;  . LEFT HEART CATHETERIZATION WITH CORONARY/GRAFT ANGIOGRAM N/A 09/25/2013    Procedure: LEFT HEART CATHETERIZATION WITH Beatrix Fetters;  Surgeon: Blane Ohara, MD;  Location: [facility] CATH LAB;  Service: Cardiovascular;  Laterality: N/A;  . Persantine Myoview  05/06/2010   Post-rest ejection fraction 30%. No significant ischemia demonstrated. Compared to previous study there is no significant change.  . TRANSURETHRAL RESECTION OF PROSTATE     s/p   Patient Active Problem List   Diagnosis Date Noted  . Hypotension 08/14/2018  . Acute on chronic systolic CHF (congestive heart failure) ([place]) 08/12/2018  . Acute on chronic combined systolic and diastolic CHF (congestive heart failure) ([place])   . GI bleed 05/26/2018  . Occult GI bleeding 05/25/2018  . Normocytic anemia 05/25/2018  . Elevated troponin 05/25/2018  . Anemia   . Lymphedema 02/28/2018  . Weakness 07/15/2016  . Fatigue 07/15/2016  . CVA (cerebral infarction) 10/31/2015  . Bulbous urethral stricture 09/18/2015  . Bilateral deafness 08/19/2015  . Bilateral cataracts 08/19/2015  . Acid reflux 08/19/2015  . HLD (hyperlipidemia) 08/19/2015  . BP (high blood pressure) 08/19/2015  . Myocardial infarction ([place]) 08/19/2015  . Calculus of kidney 08/19/2015  . Artificial cardiac pacemaker 08/19/2015  . Gastroduodenal ulcer 08/19/2015  . Dupuytren's contracture of foot 08/19/2015  . Malignant neoplasm of prostate ([place]) 08/19/2015  . Microhematuria 08/19/2015  . Coronary artery disease 02/22/2015  . Status post coronary artery bypass grafting 02/22/2015  . Benign essential HTN 04/02/2014  . Hematochezia 02/20/2014  . Moderate tricuspid regurgitation 02/20/2014  . Mobitz type II atrioventricular block 12/12/2013  . Long term current use of anticoagulant 10/18/2013  . Cardiomyopathy, ischemic: Ejection fraction 45% 11/11/2012  . CAD Status post coronary artery bypass grafting: 1995 11/11/2012  . Permanent atrial fibrillation ([place]) 11/11/2012  . Biventricular ICD Medtronic Viva single chamber October  2014 11/11/2012  . Chronic systolic heart failure ([place]) 11/11/2012      Prior to Admission medications   Medication Sig Start Date End Date Taking? Authorizing Provider  albuterol (VENTOLIN HFA) 108 (90 Base) MCG/ACT inhaler Inhale 2 puffs into the lungs 4 (four) times daily as needed. Patient taking differently: Inhale 2 puffs into the lungs 4 (four) times daily as needed for wheezing or shortness of breath.  08/02/18   Croitoru, Mihai, MD  carvedilol (COREG) 3.125 MG tablet Take 1 tablet (3.125 mg total) by mouth 2 (two) times daily with a meal. 08/17/18   Lavina Hamman, MD  ferrous sulfate 325 (65 FE) MG tablet Take 1 tablet (325 mg total) by mouth daily for 30 days. 05/28/18   Elodia Florence., MD  furosemide (LASIX) 40 MG tablet Take 1 tablet (40 mg total) by mouth daily. 08/24/18   Croitoru, Mihai, MD  HYDROCORTISONE, TOPICAL, 2 % LOTN Apply 40 oz topically 2 (two) times daily as needed (as needed for itching on foot). APPLY BID TO AFFECTED AREA 08/23/18   Croitoru, [name] Gobble, MD  isosorbide mononitrate (IMDUR) 30 MG 24 hr tablet Take 1 tablet (30 mg total) by mouth daily. 08/18/18   Lavina Hamman, MD  losartan (COZAAR) 25 MG tablet Take 1 tablet (25 mg total) by mouth daily. 08/18/18   Lavina Hamman, MD  pantoprazole (PROTONIX) 40 MG tablet Take 1 tablet (40 mg total) by mouth daily for  30 days. 05/29/18   Elodia Florence., MD  simvastatin (ZOCOR) 20 MG tablet Take 1 tablet (20 mg total) by mouth daily. 06/14/18   Lendon Colonel, NP  vitamin B-12 (CYANOCOBALAMIN) 500 MCG tablet Take 1 tablet (500 mcg total) by mouth daily. 08/18/18   Maisie Fus, MD    Allergies Phenazopyridine and Ramipril    Social History Social History   Tobacco Use  . Smoking status: Former Smoker    Quit date: 03/15/1985    Years since quitting: 33.5  . Smokeless tobacco: Never Used  Substance Use Topics  . Alcohol use: No    Comment: occas.  . Drug use: No    Review of Systems Patient  denies headaches, rhinorrhea, blurry vision, numbness, shortness of breath, chest pain, edema, cough, abdominal pain, nausea, vomiting, diarrhea, dysuria, fevers, rashes or hallucinations unless otherwise stated above in HPI. ____________________________________________   PHYSICAL EXAM:  VITAL SIGNS: Vitals:   09/13/18 2230 09/13/18 2300  BP: 110/64 127/71  Pulse: 60 61  Resp: (!) 25 (!) 22  Temp:    SpO2: 96% 97%    Constitutional: Alert and oriented.  Eyes: Conjunctivae are normal.  Head: Atraumatic. Nose: No congestion/rhinnorhea. Mouth/Throat: Mucous membranes are moist.   Neck: No stridor. Painless ROM.  Cardiovascular: Normal rate, regular rhythm. Grossly normal heart sounds.  Good peripheral circulation. Respiratory: Normal respiratory effort.  No retractions. Lungs CTAB. Gastrointestinal: Soft and nontender. No distention. No abdominal bruits. No CVA tenderness. Genitourinary:  Musculoskeletal: No lower extremity tenderness nor edema.  No joint effusions. Neurologic:  Normal speech and language. No gross focal neurologic deficits are appreciated. No facial droop Skin:  Skin is warm, dry and intact. No rash noted. Psychiatric: Mood and affect are normal. Speech and behavior are normal.  ____________________________________________   LABS (all labs ordered are listed, but only abnormal results are displayed)  Results for orders placed or performed during the hospital encounter of 09/13/18 (from the past 24 hour(s))  Comprehensive metabolic panel     Status: Abnormal   Collection Time: 09/13/18  6:35 PM  Result Value Ref Range   Sodium 143 135 - 145 mmol/L   Potassium 4.0 3.5 - 5.1 mmol/L   Chloride 112 (H) 98 - 111 mmol/L   CO2 27 22 - 32 mmol/L   Glucose, Bld 111 (H) 70 - 99 mg/dL   BUN 32 (H) 8 - 23 mg/dL   Creatinine, Ser 1.17 0.61 - 1.24 mg/dL   Calcium 8.2 (L) 8.9 - 10.3 mg/dL   Total Protein 5.0 (L) 6.5 - 8.1 g/dL   Albumin 2.6 (L) 3.5 - 5.0 g/dL   AST 22  15 - 41 U/L   ALT 14 0 - 44 U/L   Alkaline Phosphatase 90 38 - 126 U/L   Total Bilirubin 0.6 0.3 - 1.2 mg/dL   GFR calc non Af Amer 58 (L) >60 mL/min   GFR calc Af Amer >60 >60 mL/min   Anion gap 4 (L) 5 - 15  CBC     Status: Abnormal   Collection Time: 09/13/18  6:35 PM  Result Value Ref Range   WBC 5.7 4.0 - 10.5 K/uL   RBC 3.32 (L) 4.22 - 5.81 MIL/uL   Hemoglobin 7.7 (L) 13.0 - 17.0 g/dL   HCT 26.7 (L) 39.0 - 52.0 %   MCV 80.4 80.0 - 100.0 fL   MCH 23.2 (L) 26.0 - 34.0 pg   MCHC 28.8 (L) 30.0 - 36.0 g/dL  RDW 21.5 (H) 11.5 - 15.5 %   Platelets 227 150 - 400 K/uL   nRBC 0.0 0.0 - 0.2 %  Type and screen [facility]     Status: None (Preliminary result)   Collection Time: 09/13/18  6:35 PM  Result Value Ref Range   ABO/RH(D) O POS    Antibody Screen POS    Sample Expiration      09/16/2018,2359 Performed at [facility], [address], [place], [place] [address]    Antibody Identification PENDING    ____________________________________________ ____________________________________________  RADIOLOGY  I personally reviewed all radiographic images ordered to evaluate for the above acute complaints and reviewed radiology reports and findings.  These findings were personally discussed with the patient.  Please see medical record for radiology report.  ____________________________________________   PROCEDURES  Procedure(s) performed:  .Critical Care Performed by: Merlyn Lot, MD Authorized by: Merlyn Lot, MD   Critical care provider statement:    Critical care time (minutes):  30   Critical care time was exclusive of:  Separately billable procedures and treating other patients   Critical care was necessary to treat or prevent imminent or life-threatening deterioration of the following conditions: GI bleed.   Critical care was time spent personally by me on the following activities:  Development of treatment plan with patient  or surrogate, discussions with consultants, evaluation of patient's response to treatment, examination of patient, obtaining history from patient or surrogate, ordering and performing treatments and interventions, ordering and review of laboratory studies, ordering and review of radiographic studies, pulse oximetry, re-evaluation of patient's condition and review of old charts      Critical Care performed: yes ____________________________________________   INITIAL IMPRESSION / [place] / ED COURSE  Pertinent labs & imaging results that were available during my care of the patient were reviewed by me and considered in my medical decision making (see chart for details).   DDX: ugib, acute blood loss anemia, enteritis, ida   Thomas [name] is a 84 y.o. who presents to the ED with symptoms as described above.  Patient with evidence of symptomatic of subacute and chronic blood loss that seems to gotten the point where he is now much more symptomatic from it.  Did have outpatient scan that showed evidence of duodenal and gastric bleeding therefore he was sent to the ER.  Is not currently on any blood thinners.  Clinical Course as of Sep 12 2337  Tue Sep 13, 2018  2213 This case was discussed in consultation with Dr. Bonna Gains of GI.  Agrees with plan for Protonix infusion and drip as well as transfusion with plan for endoscopy tomorrow.   [PR]    Clinical Course User Index [PR] Merlyn Lot, MD    The patient was evaluated in Emergency Department today for the symptoms described in the history of present illness. He/she was evaluated in the context of the global COVID-19 pandemic, which necessitated consideration that the patient might be at risk for infection with the SARS-CoV-2 virus that causes COVID-19. Institutional protocols and algorithms that pertain to the evaluation of patients at risk for COVID-19 are in a state of rapid change based on information released by  regulatory bodies including the CDC and federal and state organizations. These policies and algorithms were followed during the patient's care in the ED.  As part of my medical decision making, I reviewed the following data within the [place] notes reviewed and incorporated, Labs  reviewed, notes from prior ED visits and [place] Controlled Substance Database   ____________________________________________   FINAL CLINICAL IMPRESSION(S) / ED DIAGNOSES  Final diagnoses:  UGIB (upper gastrointestinal bleed)  Symptomatic anemia      NEW MEDICATIONS STARTED DURING THIS VISIT:  New Prescriptions   No medications on file     Note:  This document was prepared using Dragon voice recognition software and may include unintentional dictation errors.    Merlyn Lot, MD 09/13/18 [phone]

## 2018-09-13 NOTE — ED Notes (Signed)
Patient awaiting admission for blood transfusion and endoscopy with Dr. Alice Reichert.

## 2018-09-13 NOTE — ED Notes (Signed)
Lab called with result of "positive antibody scree" Dr. Archie Balboa notified.

## 2018-09-13 NOTE — ED Triage Notes (Signed)
ALS interpreter used for triage. Per wife, patient was seen at [place] [facility] last Tuesday and had endoscopy done at [facility] last week. Patient was called by GI and instructed to come to ED due to low blood count and detection of GI bleed. Patient reports weakness x2 weeks. States he has had dark stools for several days.

## 2018-09-14 ENCOUNTER — Encounter: Payer: Self-pay | Admitting: Anesthesiology

## 2018-09-14 ENCOUNTER — Encounter
Admission: EM | Disposition: A | Discharge status: Home / Self Care | Payer: Self-pay | Source: Home / Self Care | Attending: Internal Medicine

## 2018-09-14 ENCOUNTER — Inpatient Hospital Stay: Payer: PPO | Admitting: Anesthesiology

## 2018-09-14 DIAGNOSIS — Z8546 Personal history of malignant neoplasm of prostate: Secondary | ICD-10-CM | POA: Diagnosis not present

## 2018-09-14 DIAGNOSIS — N183 Chronic kidney disease, stage 3 (moderate): Secondary | ICD-10-CM | POA: Diagnosis present

## 2018-09-14 DIAGNOSIS — K222 Esophageal obstruction: Secondary | ICD-10-CM | POA: Diagnosis present

## 2018-09-14 DIAGNOSIS — K2981 Duodenitis with bleeding: Secondary | ICD-10-CM | POA: Diagnosis present

## 2018-09-14 DIAGNOSIS — I5022 Chronic systolic (congestive) heart failure: Secondary | ICD-10-CM | POA: Diagnosis present

## 2018-09-14 DIAGNOSIS — E785 Hyperlipidemia, unspecified: Secondary | ICD-10-CM | POA: Diagnosis present

## 2018-09-14 DIAGNOSIS — Z8711 Personal history of peptic ulcer disease: Secondary | ICD-10-CM | POA: Diagnosis not present

## 2018-09-14 DIAGNOSIS — K922 Gastrointestinal hemorrhage, unspecified: Secondary | ICD-10-CM | POA: Diagnosis present

## 2018-09-14 DIAGNOSIS — D62 Acute posthemorrhagic anemia: Secondary | ICD-10-CM | POA: Diagnosis present

## 2018-09-14 DIAGNOSIS — Z8673 Personal history of transient ischemic attack (TIA), and cerebral infarction without residual deficits: Secondary | ICD-10-CM | POA: Diagnosis not present

## 2018-09-14 DIAGNOSIS — I2510 Atherosclerotic heart disease of native coronary artery without angina pectoris: Secondary | ICD-10-CM | POA: Diagnosis present

## 2018-09-14 DIAGNOSIS — K31811 Angiodysplasia of stomach and duodenum with bleeding: Secondary | ICD-10-CM | POA: Diagnosis present

## 2018-09-14 DIAGNOSIS — I959 Hypotension, unspecified: Secondary | ICD-10-CM | POA: Diagnosis present

## 2018-09-14 DIAGNOSIS — Z79899 Other long term (current) drug therapy: Secondary | ICD-10-CM | POA: Diagnosis not present

## 2018-09-14 DIAGNOSIS — Z888 Allergy status to other drugs, medicaments and biological substances status: Secondary | ICD-10-CM | POA: Diagnosis not present

## 2018-09-14 DIAGNOSIS — Z9861 Coronary angioplasty status: Secondary | ICD-10-CM | POA: Diagnosis not present

## 2018-09-14 DIAGNOSIS — Z9079 Acquired absence of other genital organ(s): Secondary | ICD-10-CM | POA: Diagnosis not present

## 2018-09-14 DIAGNOSIS — I480 Paroxysmal atrial fibrillation: Secondary | ICD-10-CM | POA: Diagnosis present

## 2018-09-14 DIAGNOSIS — Z951 Presence of aortocoronary bypass graft: Secondary | ICD-10-CM | POA: Diagnosis not present

## 2018-09-14 DIAGNOSIS — I130 Hypertensive heart and chronic kidney disease with heart failure and stage 1 through stage 4 chronic kidney disease, or unspecified chronic kidney disease: Secondary | ICD-10-CM | POA: Diagnosis present

## 2018-09-14 DIAGNOSIS — I255 Ischemic cardiomyopathy: Secondary | ICD-10-CM | POA: Diagnosis present

## 2018-09-14 DIAGNOSIS — K2941 Chronic atrophic gastritis with bleeding: Secondary | ICD-10-CM | POA: Diagnosis present

## 2018-09-14 DIAGNOSIS — I10 Essential (primary) hypertension: Secondary | ICD-10-CM | POA: Diagnosis not present

## 2018-09-14 DIAGNOSIS — Z1159 Encounter for screening for other viral diseases: Secondary | ICD-10-CM | POA: Diagnosis not present

## 2018-09-14 DIAGNOSIS — Z95810 Presence of automatic (implantable) cardiac defibrillator: Secondary | ICD-10-CM | POA: Diagnosis not present

## 2018-09-14 DIAGNOSIS — K449 Diaphragmatic hernia without obstruction or gangrene: Secondary | ICD-10-CM | POA: Diagnosis present

## 2018-09-14 HISTORY — PX: ESOPHAGOGASTRODUODENOSCOPY: SHX5428

## 2018-09-14 HISTORY — PX: ENTEROSCOPY: SHX5533

## 2018-09-14 LAB — TSH: TSH: 4.939 u[IU]/mL — ABNORMAL HIGH (ref 0.350–4.500)

## 2018-09-14 LAB — ABO/RH: ABO/RH(D): O POS

## 2018-09-14 SURGERY — EGD (ESOPHAGOGASTRODUODENOSCOPY)
Anesthesia: General

## 2018-09-14 MED ORDER — VITAMIN B-12 1000 MCG PO TABS
500.0000 ug | ORAL_TABLET | Freq: Every day | ORAL | Status: DC
  Administered 2018-09-14 – 2018-09-16 (×3): 500 ug via ORAL
  Filled 2018-09-14 (×3): qty 1

## 2018-09-14 MED ORDER — PROPOFOL 500 MG/50ML IV EMUL
INTRAVENOUS | Status: AC
  Filled 2018-09-14: qty 50

## 2018-09-14 MED ORDER — ONDANSETRON HCL 4 MG PO TABS: 4.0000 mg | ORAL_TABLET | Freq: Four times a day (QID) | ORAL | Status: DC | PRN

## 2018-09-14 MED ORDER — SODIUM CHLORIDE 0.9 % IV SOLN
INTRAVENOUS | Status: DC
  Administered 2018-09-14: 14:00:00 via INTRAVENOUS

## 2018-09-14 MED ORDER — EPINEPHRINE 1 MG/10ML IJ SOSY
PREFILLED_SYRINGE | INTRAMUSCULAR | Status: AC
  Filled 2018-09-14: qty 10

## 2018-09-14 MED ORDER — ALBUTEROL SULFATE (2.5 MG/3ML) 0.083% IN NEBU: 2.5000 mg | INHALATION_SOLUTION | RESPIRATORY_TRACT | Status: DC | PRN

## 2018-09-14 MED ORDER — LIDOCAINE HCL (PF) 2 % IJ SOLN
INTRAMUSCULAR | Status: AC
  Filled 2018-09-14: qty 10

## 2018-09-14 MED ORDER — FERROUS SULFATE 325 (65 FE) MG PO TABS
325.0000 mg | ORAL_TABLET | Freq: Every day | ORAL | Status: DC
  Administered 2018-09-14 – 2018-09-16 (×3): 325 mg via ORAL
  Filled 2018-09-14 (×3): qty 1

## 2018-09-14 MED ORDER — ISOSORBIDE MONONITRATE ER 30 MG PO TB24
30.0000 mg | ORAL_TABLET | Freq: Every day | ORAL | Status: DC
  Administered 2018-09-14 – 2018-09-16 (×3): 30 mg via ORAL
  Filled 2018-09-14 (×3): qty 1

## 2018-09-14 MED ORDER — PROPOFOL 10 MG/ML IV BOLUS
INTRAVENOUS | Status: DC | PRN
  Administered 2018-09-14: 10 mg via INTRAVENOUS

## 2018-09-14 MED ORDER — LOSARTAN POTASSIUM 25 MG PO TABS
25.0000 mg | ORAL_TABLET | Freq: Every day | ORAL | Status: DC
  Administered 2018-09-14 – 2018-09-16 (×3): 25 mg via ORAL
  Filled 2018-09-14 (×3): qty 1

## 2018-09-14 MED ORDER — PANTOPRAZOLE SODIUM 40 MG IV SOLR
40.0000 mg | Freq: Two times a day (BID) | INTRAVENOUS | Status: DC
  Administered 2018-09-14 – 2018-09-15 (×3): 40 mg via INTRAVENOUS
  Filled 2018-09-14 (×3): qty 40

## 2018-09-14 MED ORDER — SODIUM CHLORIDE (PF) 0.9 % IJ SOLN
PREFILLED_SYRINGE | INTRAMUSCULAR | Status: DC | PRN
  Administered 2018-09-14: 15:00:00 3 mL

## 2018-09-14 MED ORDER — PHENYLEPHRINE HCL (PRESSORS) 10 MG/ML IV SOLN
INTRAVENOUS | Status: AC
  Filled 2018-09-14: qty 1

## 2018-09-14 MED ORDER — DOCUSATE SODIUM 100 MG PO CAPS
100.0000 mg | ORAL_CAPSULE | Freq: Two times a day (BID) | ORAL | Status: DC
  Administered 2018-09-14 – 2018-09-16 (×4): 100 mg via ORAL
  Filled 2018-09-14 (×4): qty 1

## 2018-09-14 MED ORDER — ACETAMINOPHEN 325 MG PO TABS: 650.0000 mg | ORAL_TABLET | Freq: Four times a day (QID) | ORAL | Status: DC | PRN

## 2018-09-14 MED ORDER — CARVEDILOL 6.25 MG PO TABS
3.1250 mg | ORAL_TABLET | Freq: Two times a day (BID) | ORAL | Status: DC
  Administered 2018-09-14 – 2018-09-16 (×3): 3.125 mg via ORAL
  Filled 2018-09-14 (×4): qty 1

## 2018-09-14 MED ORDER — PROPOFOL 500 MG/50ML IV EMUL
INTRAVENOUS | Status: DC | PRN
  Administered 2018-09-14: 120 ug/kg/min via INTRAVENOUS

## 2018-09-14 MED ORDER — FUROSEMIDE 40 MG PO TABS
40.0000 mg | ORAL_TABLET | Freq: Every day | ORAL | Status: DC
  Administered 2018-09-14 – 2018-09-16 (×3): 40 mg via ORAL
  Filled 2018-09-14 (×3): qty 1

## 2018-09-14 MED ORDER — ACETAMINOPHEN 650 MG RE SUPP: 650.0000 mg | Freq: Four times a day (QID) | RECTAL | Status: DC | PRN

## 2018-09-14 MED ORDER — SIMVASTATIN 20 MG PO TABS
20.0000 mg | ORAL_TABLET | Freq: Every day | ORAL | Status: DC
  Administered 2018-09-14 – 2018-09-15 (×2): 20 mg via ORAL
  Filled 2018-09-14 (×3): qty 1

## 2018-09-14 MED ORDER — PHENYLEPHRINE HCL (PRESSORS) 10 MG/ML IV SOLN
INTRAVENOUS | Status: DC | PRN
  Administered 2018-09-14 (×2): 50 ug via INTRAVENOUS
  Administered 2018-09-14: 100 ug via INTRAVENOUS

## 2018-09-14 MED ORDER — ONDANSETRON HCL 4 MG/2ML IJ SOLN: 4.0000 mg | Freq: Four times a day (QID) | INTRAMUSCULAR | Status: DC | PRN

## 2018-09-14 MED ORDER — HYDROCORTISONE 1 % EX LOTN
1.0000 "application " | TOPICAL_LOTION | Freq: Two times a day (BID) | CUTANEOUS | Status: DC | PRN
  Filled 2018-09-14: qty 118

## 2018-09-14 NOTE — Anesthesia Post-op Follow-up Note (Signed)
Anesthesia QCDR form completed.        

## 2018-09-14 NOTE — Interval H&P Note (Signed)
History and Physical Interval Note:  09/14/2018 1:31 PM  Thomas [name]  has presented today for surgery, with the diagnosis of symptomatic anemia, GI blood loss anemia, melena, positive small bowel capsule endoscopy showing source of bleeding.  The various methods of treatment have been discussed with the patient and family. After consideration of risks, benefits and other options for treatment, the patient has consented to  Procedure(s) with comments: ESOPHAGOGASTRODUODENOSCOPY (EGD) (N/A) - SIGN LANAGUAGE INTERPRETER ENTEROSCOPY (N/A) - symptomatic anemia, GI blood loss anemia, melena, positive small bowel capsule endoscopy showing source of bleeding  as a surgical intervention.  The patient's history has been reviewed, patient examined, no change in status, stable for surgery.  I have reviewed the patient's chart and labs.  Questions were answered to the patient's satisfaction.     [employer], [place]

## 2018-09-14 NOTE — Op Note (Signed)
[facility] Patient Name: Thomas [name] Procedure Date: 09/14/2018 1:53 PM MRN: [id number] Account #: [id number] Date of Birth: 05/15/1934 Admit Type: Inpatient Age: 84 Room: [facility] ENDO ROOM 1 Gender: Male Note Status: Finalized Procedure:            Small bowel enteroscopy Indications:          Acute post hemorrhagic anemia, Recurrent bleeding in                        the small bowel, Arteriovenous malformation in the                        small intestine, Positive capsule endoscopy of the                        small intestine with evidence of blood in the distal                        stomach and proximal small bowel. Providers:            Benay Pike. Alice Reichert MD, MD Referring MD:         Irven Easterly. Kary Kos, MD (Referring MD) Medicines:            Propofol per Anesthesia Complications:        No immediate complications. Procedure:            Pre-Anesthesia Assessment:                       - The risks and benefits of the procedure and the                        sedation options and risks were discussed with the                        patient. All questions were answered and informed                        consent was obtained.                       - Patient identification and proposed procedure were                        verified prior to the procedure by the nurse. The                        procedure was verified in the procedure room.                       - ASA Grade Assessment: III - A patient with severe                        systemic disease.                       - After reviewing the risks and benefits, the patient                        was deemed in satisfactory condition to undergo the  procedure.                       After obtaining informed consent, the endoscope was                        passed under direct vision. Throughout the procedure,                        the patient's blood pressure, pulse, and  oxygen                        saturations were monitored continuously. The                        Colonoscope was introduced through the mouth and                        advanced to the mid-jejunum. The small bowel                        enteroscopy was accomplished without difficulty. The                        patient tolerated the procedure well. Findings:      A non-obstructing Schatzki ring was found at the gastroesophageal       junction.      A small hiatal hernia was present.      Diffuse atrophic mucosa was found in the cardia.      A single 7 mm angioectasia with stigmata of recent bleeding was found on       the lesser curvature of the stomach. Coagulation for hemostasis using       bipolar probe was successful.      Red blood was found in the prepyloric region of the stomach.      Two angioectasias with stigmata of recent bleeding were found in the       second portion of the duodenum. Area was successfully injected with 2 mL       of a 1:10,000 solution of epinephrine for hemostasis. Coagulation for       hemostasis using bipolar probe was successful.      Multiple angioectasias with no bleeding were found in the second portion       of the duodenum. Coagulation for bleeding prevention using bipolar probe       was successful.      Localized inflammation, characterized by adherent blood, congestion       (edema), erythema and friability was found in the major papilla.       Biopsies were taken with a cold forceps for histology.      A single angioectasia with no bleeding was found in the proximal       jejunum. Coagulation for bleeding prevention using bipolar probe was       successful. Impression:           - Non-obstructing Schatzki ring.                       - Small hiatal hernia.                       - Gastric mucosal atrophy.                       -  A single recently bleeding angioectasia in the                        stomach. Treated with bipolar cautery.                        - Red blood in the prepyloric region of the stomach.                       - Two recently bleeding angioectasias in the duodenum.                        Injected. Treated with bipolar cautery.                       - Multiple non-bleeding angioectasias in the duodenum.                        Treated with bipolar cautery.                       - Duodenitis.                       - A single non-bleeding angioectasia in the jejunum.                        Treated with bipolar cautery.                       - No bleeding from any lesions noted at the end of the                        procedure. Recommendation:       - Observe patient in hospital ward for observation.                       - Clear liquid diet.                       - The findings and recommendations were discussed with                        the patient and their spouse. Procedure Code(s):    --- Professional ---                       [phone], 78, Small intestinal endoscopy, enteroscopy                        beyond second portion of duodenum, not including ileum;                        with control of bleeding (eg, injection, bipolar                        cautery, unipolar cautery, laser, heater probe,                        stapler, plasma coagulator)                       44361, Small intestinal endoscopy, enteroscopy beyond  second portion of duodenum, not including ileum; with                        biopsy, single or multiple Diagnosis Code(s):    --- Professional ---                       D62, Acute posthemorrhagic anemia                       K55.20, Angiodysplasia of colon without hemorrhage                       K29.80, Duodenitis without bleeding                       K92.2, Gastrointestinal hemorrhage, unspecified                       K31.811, Angiodysplasia of stomach and duodenum with                        bleeding                       K31.89, Other diseases of stomach and  duodenum                       K44.9, Diaphragmatic hernia without obstruction or                        gangrene                       K22.2, Esophageal obstruction CPT copyright 2019 American Medical Association. All rights reserved. The codes documented in this report are preliminary and upon coder review may  be revised to meet current compliance requirements. Efrain Sella MD, MD 09/14/2018 2:59:24 PM This report has been signed electronically. Number of Addenda: 0 Note Initiated On: 09/14/2018 1:53 PM Estimated Blood Loss: Estimated blood loss was minimal.      [facility]

## 2018-09-14 NOTE — Anesthesia Preprocedure Evaluation (Addendum)
Anesthesia Evaluation  Patient identified by MRN, date of birth, ID band Patient awake    Reviewed: Allergy & Precautions, NPO status , Patient's Chart, lab work & pertinent test results, reviewed documented beta blocker date and time   Airway Mallampati: III  TM Distance: >3 FB     Dental  (+) Chipped   Pulmonary former smoker,           Cardiovascular hypertension, Pt. on medications and Pt. on home beta blockers + CAD, + Past MI, + CABG and +CHF  + dysrhythmias Atrial Fibrillation + pacemaker + Cardiac Defibrillator      Neuro/Psych    GI/Hepatic PUD, GERD  ,  Endo/Other    Renal/GU Renal disease     Musculoskeletal   Abdominal   Peds  Hematology  (+) Blood dyscrasia, anemia ,   Anesthesia Other Findings Deaf  Reproductive/Obstetrics                            Anesthesia Physical Anesthesia Plan  ASA: III  Anesthesia Plan: General   Post-op Pain Management:    Induction: Intravenous  PONV Risk Score and Plan: Propofol infusion  Airway Management Planned:   Additional Equipment:   Intra-op Plan:   Post-operative Plan:   Informed Consent: I have reviewed the patients History and Physical, chart, labs and discussed the procedure including the risks, benefits and alternatives for the proposed anesthesia with the patient or authorized representative who has indicated his/her understanding and acceptance.       Plan Discussed with: CRNA  Anesthesia Plan Comments: (Consent obtained with sign language interpreter with assistance from wife.  KLF)       Anesthesia Quick Evaluation

## 2018-09-14 NOTE — Progress Notes (Signed)
MD notified of 10-beat run of v-tach.

## 2018-09-14 NOTE — H&P (View-Only) (Signed)
[facility] GI Inpatient Consult Note   Kathline Magic, M.D.  Reason for Consult: melena, anemia, positive small bowel capsule endoscopy.   Attending Requesting Consult: Rosilyn Mings, M.D.  Outpatient Primary Physician: [name], M.D.  History of Present Illness: Thomas [name] is a 84 y.o. male who is pleasant and hearing impaired presenting for symptomatic anemia with fatigue as well as symptoms of intermittent melena.  Hemoglobin was low at 7.7 when admitted last evening.  He is a patient with whom I am very familiar given previous work-up for anemia on July 13, 2018 with EGD and colonoscopy.  EGD showed 2 nonbleeding duodenal AVMs that were cauterized using APC cautery.  Patient had a small blood clot in the distal stomach that was suctioned out without any underlying mucosal lesion.  Colonoscopy revealed small tubular adenoma of the rectum but was otherwise unremarkable. The patient had a outpatient small bowel capsule endoscopy performed at [facility] recently.  I did send him to do given the fact that he has a implantable defibrillator.  The procedure went off without issues and there was noted to be some blood in the distal prepyloric region of the stomach as well as in the proximal small bowel.  This corresponded with the same area that was cauterized in April 2020.  It is unknown if there are more distal lesions present but none were noted on the capsule endoscopy. The patient denies any abdominal pain at present and seems to be in no distress.  He denies any hematemesis.  There is been otherwise no change in bowel habits. All history above was obtained from the patient and his wife using a video interpreter versed in the skill of sign language.  Past Medical History:  Past Medical History:  Diagnosis Date  . AICD (automatic cardioverter/defibrillator) present   . Biventricular ICD (implantable cardioverter-defibrillator) in place 03/24/2005   Implantation of a  Medtronic Adapta ADDRO1, serial number [id number]  . CHF (congestive heart failure) ([place])   . CKD (chronic kidney disease), stage III ([place])   . Coronary artery disease    a. s/p CABG 1986. b. Multiple PCIs/caths. c. 09/2013: s/p PTCA and BMS to SVG-OM.  [name]   . Dysrhythmia   . History of abdominal aortic aneurysm   . History of bleeding peptic ulcer 1980  . History of epididymitis 2013  . HTN (hypertension)   . Hydronephrosis with ureteropelvic junction obstruction   . Hydroureter on left 2009  . Hypertension   . Ischemic cardiomyopathy    a. Prior EF 30-35%, s/p BIV-ICD. b. 09/2013: EF 45-50%.  . Moderate tricuspid regurgitation   . PAF (paroxysmal atrial fibrillation) ([place])   . Presence of permanent cardiac pacemaker   . Prostate cancer ([place])   . Status post coronary artery bypass grafting 1986   LIMA to the LAD, SVG to OM, SVG to RCA  . Testicular swelling     Problem List: Patient Active Problem List   Diagnosis Date Noted  . GIB (gastrointestinal bleeding) 09/14/2018  . Hypotension 08/14/2018  . Acute on chronic systolic CHF (congestive heart failure) ([place]) 08/12/2018  . Acute on chronic combined systolic and diastolic CHF (congestive heart failure) ([place])   . GI bleed 05/26/2018  . Occult GI bleeding 05/25/2018  . Normocytic anemia 05/25/2018  . Elevated troponin 05/25/2018  . Anemia   . Lymphedema 02/28/2018  . Weakness 07/15/2016  . Fatigue 07/15/2016  . CVA (cerebral infarction) 10/31/2015  . Bulbous urethral stricture 09/18/2015  .  Bilateral deafness 08/19/2015  . Bilateral cataracts 08/19/2015  . Acid reflux 08/19/2015  . HLD (hyperlipidemia) 08/19/2015  . BP (high blood pressure) 08/19/2015  . Myocardial infarction ([place]) 08/19/2015  . Calculus of kidney 08/19/2015  . Artificial cardiac pacemaker 08/19/2015  . Gastroduodenal ulcer 08/19/2015  . Dupuytren's contracture of foot 08/19/2015  . Malignant neoplasm of prostate ([place]) 08/19/2015  . Microhematuria  08/19/2015  . Coronary artery disease 02/22/2015  . Status post coronary artery bypass grafting 02/22/2015  . Benign essential HTN 04/02/2014  . Hematochezia 02/20/2014  . Moderate tricuspid regurgitation 02/20/2014  . Mobitz type II atrioventricular block 12/12/2013  . Long term current use of anticoagulant 10/18/2013  . Cardiomyopathy, ischemic: Ejection fraction 45% 11/11/2012  . CAD Status post coronary artery bypass grafting: 1995 11/11/2012  . Permanent atrial fibrillation ([place]) 11/11/2012  . Biventricular ICD Medtronic Viva single chamber October 2014 11/11/2012  . Chronic systolic heart failure ([place]) 11/11/2012    Past Surgical History: Past Surgical History:  Procedure Laterality Date  . 2-D echocardiogram  11/20/2011   Ejection fraction 30-35% moderate concentric left ventricular hypertrophy. Left atrium is moderately dilated. Mild MR. Mild or  . BI-VENTRICULAR IMPLANTABLE CARDIOVERTER DEFIBRILLATOR N/A 12/16/2012   Procedure: BI-VENTRICULAR IMPLANTABLE CARDIOVERTER DEFIBRILLATOR  (CRT-D);  Surgeon: [name], MD;  Location[facility] CATH LAB;  Service: Cardiovascular;  Laterality: N/A;  . CARDIAC CATHETERIZATION  12/10/2011   SVG to OM widely patent.  LIMA to LAD patent  . CATARACT EXTRACTION W/PHACO Right 10/12/2017   Procedure: CATARACT EXTRACTION PHACO AND INTRAOCULAR LENS PLACEMENT (IOC);  Surgeon: Birder Robson, MD;  Location: [facility] ORS;  Service: Ophthalmology;  Laterality: Right;  [place] 00:57 AP% 15.9 CDE 9.07 Fluid pack lot # [id number]  . COLONOSCOPY N/A 07/13/2018   Procedure: COLONOSCOPY;  Surgeon: Terri Rorrer, Benay Pike, MD;  Location: [facility] ENDOSCOPY;  Service: Gastroenterology;  Laterality: N/A;  . CORONARY ARTERY BYPASS GRAFT  1986  . ESOPHAGOGASTRODUODENOSCOPY N/A 07/13/2018   Procedure: ESOPHAGOGASTRODUODENOSCOPY (EGD);  Surgeon: Jaleil Renwick, Benay Pike, MD;  Location: [facility] ENDOSCOPY;  Service: Gastroenterology;  Laterality: N/A;  . ESOPHAGOGASTRODUODENOSCOPY (EGD) WITH  PROPOFOL N/A 05/27/2018   Procedure: ESOPHAGOGASTRODUODENOSCOPY (EGD) WITH PROPOFOL;  Surgeon: Clarene Essex, MD;  Location: [place];  Service: Endoscopy;  Laterality: N/A;  . INSERT / REPLACE / REMOVE PACEMAKER    . LEFT HEART CATHETERIZATION WITH CORONARY/GRAFT ANGIOGRAM N/A 12/10/2011   Procedure: LEFT HEART CATHETERIZATION WITH Beatrix Fetters;  Surgeon: Sanda Klein, MD;  Location: [place] CATH LAB;  Service: Cardiovascular;  Laterality: N/A;  . LEFT HEART CATHETERIZATION WITH CORONARY/GRAFT ANGIOGRAM N/A 09/25/2013   Procedure: LEFT HEART CATHETERIZATION WITH Beatrix Fetters;  Surgeon: Blane Ohara, MD;  Location: [facility] CATH LAB;  Service: Cardiovascular;  Laterality: N/A;  . Persantine Myoview  05/06/2010   Post-rest ejection fraction 30%. No significant ischemia demonstrated. Compared to previous study there is no significant change.  . TRANSURETHRAL RESECTION OF PROSTATE     s/p    Allergies: Allergies  Allergen Reactions  . Phenazopyridine Nausea Only and Other (See Comments)    GI UPSET  . Ramipril Other (See Comments)    unk Other reaction(s): Other (See Comments), Unknown unk    Home Medications: Medications Prior to Admission  Medication Sig Dispense Refill Last Dose  . albuterol (VENTOLIN HFA) 108 (90 Base) MCG/ACT inhaler Inhale 2 puffs into the lungs 4 (four) times daily as needed. (Patient taking differently: Inhale 2 puffs into the lungs 4 (four) times daily as needed for wheezing or shortness of  breath. ) 1 Inhaler 1 prn at prn  . carvedilol (COREG) 3.125 MG tablet Take 1 tablet (3.125 mg total) by mouth 2 (two) times daily with a meal. 60 tablet 0 unknown at unknown  . ferrous sulfate 325 (65 FE) MG tablet Take 1 tablet (325 mg total) by mouth daily for 30 days. 30 tablet 0 unknown at unknown  . furosemide (LASIX) 40 MG tablet Take 1 tablet (40 mg total) by mouth daily. 90 tablet 3 unknown at unknown  . isosorbide mononitrate (IMDUR) 30 MG 24 hr  tablet Take 1 tablet (30 mg total) by mouth daily. 30 tablet 0 unknown at unknown  . losartan (COZAAR) 25 MG tablet Take 1 tablet (25 mg total) by mouth daily. 30 tablet 0 unknown at unknown  . pantoprazole (PROTONIX) 40 MG tablet Take 1 tablet (40 mg total) by mouth daily for 30 days. 30 tablet 0 unknown at unknown  . simvastatin (ZOCOR) 20 MG tablet Take 1 tablet (20 mg total) by mouth daily. 90 tablet 2 unknown at unknown  . vitamin B-12 (CYANOCOBALAMIN) 500 MCG tablet Take 1 tablet (500 mcg total) by mouth daily. 90 tablet 1 unknown at unknown  . HYDROCORTISONE, TOPICAL, 2 % LOTN Apply 40 oz topically 2 (two) times daily as needed (as needed for itching on foot). APPLY BID TO AFFECTED AREA 1 Bottle 3 unknown at unknown   Home medication reconciliation was completed with the patient.   Scheduled Inpatient Medications:   . carvedilol  3.125 mg Oral BID WC  . docusate sodium  100 mg Oral BID  . ferrous sulfate  325 mg Oral Daily  . furosemide  40 mg Oral Daily  . isosorbide mononitrate  30 mg Oral Daily  . losartan  25 mg Oral Daily  . pantoprazole (PROTONIX) IV  40 mg Intravenous Q12H  . simvastatin  20 mg Oral Daily  . vitamin B-12  500 mcg Oral Daily    Continuous Inpatient Infusions:    PRN Inpatient Medications:  acetaminophen **OR** acetaminophen, albuterol, hydrocortisone, ondansetron **OR** ondansetron (ZOFRAN) IV  Family History: family history includes Hypertension in his father.   GI Family History: Negative  Social History:   reports that he quit smoking about 33 years ago. He has never used smokeless tobacco. He reports that he does not drink alcohol or use drugs. The patient denies ETOH, tobacco, or drug use.    Review of Systems: Review of Systems - Negative except That in the history of present illness.  Physical Examination: BP 116/66   Pulse 61   Temp 98.3 F (36.8 C) (Oral)   Resp 17   Ht 6\' 2"  (1.88 m)   Wt 102.4 kg   SpO2 93%   BMI 28.98 kg/m   Physical Exam Vitals signs and nursing note reviewed.  Constitutional:      Appearance: Normal appearance.  HENT:     Head: Normocephalic and atraumatic.  Cardiovascular:     Rate and Rhythm: Normal rate and regular rhythm.     Pulses: Normal pulses.  Pulmonary:     Effort: Pulmonary effort is normal.     Breath sounds: Normal breath sounds.  Abdominal:     General: Abdomen is flat.     Palpations: Abdomen is soft.  Musculoskeletal: Normal range of motion.  Skin:    General: Skin is warm and dry.  Neurological:     Mental Status: He is alert.     Comments: Patient has sensorineural hearing loss  Psychiatric:  Mood and Affect: Mood normal.        Behavior: Behavior normal.     Data: Lab Results  Component Value Date   WBC 5.7 09/13/2018   HGB 7.7 (L) 09/13/2018   HCT 26.7 (L) 09/13/2018   MCV 80.4 09/13/2018   PLT 227 09/13/2018   Recent Labs  Lab 09/13/18 1835  HGB 7.7*   Lab Results  Component Value Date   NA 143 09/13/2018   K 4.0 09/13/2018   CL 112 (H) 09/13/2018   CO2 27 09/13/2018   BUN 32 (H) 09/13/2018   CREATININE 1.17 09/13/2018   Lab Results  Component Value Date   ALT 14 09/13/2018   AST 22 09/13/2018   ALKPHOS 90 09/13/2018   BILITOT 0.6 09/13/2018   No results for input(s): APTT, INR, PTT in the last 168 hours. CBC Latest Ref Rng & Units 09/13/2018 08/17/2018 08/16/2018  WBC 4.0 - 10.5 K/uL 5.7 5.8 5.6  Hemoglobin 13.0 - 17.0 g/dL 7.7(L) 7.8(L) 7.9(L)  Hematocrit 39.0 - 52.0 % 26.7(L) 25.9(L) 25.8(L)  Platelets 150 - 400 K/uL 227 195 212    STUDIES: No results found. @IMAGES @  Assessment: 1.  Gastrointestinal blood loss anemia-likely related to small bowel vascular malformations. 2.  Small bowel angiodysplasia. 3.  Bilateral hearing loss. 4.  Coronary artery disease status post coronary bypass x3 status post AICD implantation.  Currently stable.  COVID-19 status: Tested negative      Recommendations:  1.  Transfuse as  necessary to preserve oxygen carrying capacity for patient with history of coronary disease. 2.  EGD with small bowel enteroscopy and possible injection/cautery hemostasis.The patient understands the nature of the planned procedure, indications, risks, alternatives and potential complications including but not limited to bleeding, infection, perforation, damage to internal organs and possible oversedation/side effects from anesthesia. The patient agrees and gives consent to proceed.  Please refer to procedure notes for findings, recommendations and patient disposition/instructions.   Thank you for the consult. Please call with questions or concerns.  Olean Ree, "Lanny Hurst MD [facility] [place], [place] [address] [phone]  09/14/2018 1:01 PM

## 2018-09-14 NOTE — Consult Note (Signed)
[facility] GI Inpatient Consult Note   Kathline Magic, M.D.  Reason for Consult: melena, anemia, positive small bowel capsule endoscopy.   Attending Requesting Consult: Rosilyn Mings, M.D.  Outpatient Primary Physician: [name], M.D.  History of Present Illness: Thomas [name] is a 84 y.o. male who is pleasant and hearing impaired presenting for symptomatic anemia with fatigue as well as symptoms of intermittent melena.  Hemoglobin was low at 7.7 when admitted last evening.  He is a patient with whom I am very familiar given previous work-up for anemia on July 13, 2018 with EGD and colonoscopy.  EGD showed 2 nonbleeding duodenal AVMs that were cauterized using APC cautery.  Patient had a small blood clot in the distal stomach that was suctioned out without any underlying mucosal lesion.  Colonoscopy revealed small tubular adenoma of the rectum but was otherwise unremarkable. The patient had a outpatient small bowel capsule endoscopy performed at [facility] recently.  I did send him to do given the fact that he has a implantable defibrillator.  The procedure went off without issues and there was noted to be some blood in the distal prepyloric region of the stomach as well as in the proximal small bowel.  This corresponded with the same area that was cauterized in April 2020.  It is unknown if there are more distal lesions present but none were noted on the capsule endoscopy. The patient denies any abdominal pain at present and seems to be in no distress.  He denies any hematemesis.  There is been otherwise no change in bowel habits. All history above was obtained from the patient and his wife using a video interpreter versed in the skill of sign language.  Past Medical History:  Past Medical History:  Diagnosis Date  . AICD (automatic cardioverter/defibrillator) present   . Biventricular ICD (implantable cardioverter-defibrillator) in place 03/24/2005   Implantation of a  Medtronic Adapta ADDRO1, serial number [id number]  . CHF (congestive heart failure) ([place])   . CKD (chronic kidney disease), stage III ([place])   . Coronary artery disease    a. s/p CABG 1986. b. Multiple PCIs/caths. c. 09/2013: s/p PTCA and BMS to SVG-OM.  [name]   . Dysrhythmia   . History of abdominal aortic aneurysm   . History of bleeding peptic ulcer 1980  . History of epididymitis 2013  . HTN (hypertension)   . Hydronephrosis with ureteropelvic junction obstruction   . Hydroureter on left 2009  . Hypertension   . Ischemic cardiomyopathy    a. Prior EF 30-35%, s/p BIV-ICD. b. 09/2013: EF 45-50%.  . Moderate tricuspid regurgitation   . PAF (paroxysmal atrial fibrillation) ([place])   . Presence of permanent cardiac pacemaker   . Prostate cancer ([place])   . Status post coronary artery bypass grafting 1986   LIMA to the LAD, SVG to OM, SVG to RCA  . Testicular swelling     Problem List: Patient Active Problem List   Diagnosis Date Noted  . GIB (gastrointestinal bleeding) 09/14/2018  . Hypotension 08/14/2018  . Acute on chronic systolic CHF (congestive heart failure) ([place]) 08/12/2018  . Acute on chronic combined systolic and diastolic CHF (congestive heart failure) ([place])   . GI bleed 05/26/2018  . Occult GI bleeding 05/25/2018  . Normocytic anemia 05/25/2018  . Elevated troponin 05/25/2018  . Anemia   . Lymphedema 02/28/2018  . Weakness 07/15/2016  . Fatigue 07/15/2016  . CVA (cerebral infarction) 10/31/2015  . Bulbous urethral stricture 09/18/2015  .  Bilateral deafness 08/19/2015  . Bilateral cataracts 08/19/2015  . Acid reflux 08/19/2015  . HLD (hyperlipidemia) 08/19/2015  . BP (high blood pressure) 08/19/2015  . Myocardial infarction ([place]) 08/19/2015  . Calculus of kidney 08/19/2015  . Artificial cardiac pacemaker 08/19/2015  . Gastroduodenal ulcer 08/19/2015  . Dupuytren's contracture of foot 08/19/2015  . Malignant neoplasm of prostate ([place]) 08/19/2015  . Microhematuria  08/19/2015  . Coronary artery disease 02/22/2015  . Status post coronary artery bypass grafting 02/22/2015  . Benign essential HTN 04/02/2014  . Hematochezia 02/20/2014  . Moderate tricuspid regurgitation 02/20/2014  . Mobitz type II atrioventricular block 12/12/2013  . Long term current use of anticoagulant 10/18/2013  . Cardiomyopathy, ischemic: Ejection fraction 45% 11/11/2012  . CAD Status post coronary artery bypass grafting: 1995 11/11/2012  . Permanent atrial fibrillation ([place]) 11/11/2012  . Biventricular ICD Medtronic Viva single chamber October 2014 11/11/2012  . Chronic systolic heart failure ([employer]) 11/11/2012    Past Surgical History: Past Surgical History:  Procedure Laterality Date  . 2-D echocardiogram  11/20/2011   Ejection fraction 30-35% moderate concentric left ventricular hypertrophy. Left atrium is moderately dilated. Mild MR. Mild or  . BI-VENTRICULAR IMPLANTABLE CARDIOVERTER DEFIBRILLATOR N/A 12/16/2012   Procedure: BI-VENTRICULAR IMPLANTABLE CARDIOVERTER DEFIBRILLATOR  (CRT-D);  Surgeon: [name], MD;  Location: [facility] CATH LAB;  Service: Cardiovascular;  Laterality: N/A;  . CARDIAC CATHETERIZATION  12/10/2011   SVG to OM widely patent.  LIMA to LAD patent  . CATARACT EXTRACTION W/PHACO Right 10/12/2017   Procedure: CATARACT EXTRACTION PHACO AND INTRAOCULAR LENS PLACEMENT (IOC);  Surgeon: Birder Robson, MD;  Location: [facility] ORS;  Service: Ophthalmology;  Laterality: Right;  [place] 00:57 AP% 15.9 CDE 9.07 Fluid pack lot # [id number]  . COLONOSCOPY N/A 07/13/2018   Procedure: COLONOSCOPY;  Surgeon: , Benay Pike, MD;  Location: [facility] ENDOSCOPY;  Service: Gastroenterology;  Laterality: N/A;  . CORONARY ARTERY BYPASS GRAFT  1986  . ESOPHAGOGASTRODUODENOSCOPY N/A 07/13/2018   Procedure: ESOPHAGOGASTRODUODENOSCOPY (EGD);  Surgeon: , Benay Pike, MD;  Location: [facility] ENDOSCOPY;  Service: Gastroenterology;  Laterality: N/A;  . ESOPHAGOGASTRODUODENOSCOPY (EGD) WITH  PROPOFOL N/A 05/27/2018   Procedure: ESOPHAGOGASTRODUODENOSCOPY (EGD) WITH PROPOFOL;  Surgeon: Clarene Essex, MD;  Location: [facility];  Service: Endoscopy;  Laterality: N/A;  . INSERT / REPLACE / REMOVE PACEMAKER    . LEFT HEART CATHETERIZATION WITH CORONARY/GRAFT ANGIOGRAM N/A 12/10/2011   Procedure: LEFT HEART CATHETERIZATION WITH Beatrix Fetters;  Surgeon: Sanda Klein, MD;  Location: [facility] CATH LAB;  Service: Cardiovascular;  Laterality: N/A;  . LEFT HEART CATHETERIZATION WITH CORONARY/GRAFT ANGIOGRAM N/A 09/25/2013   Procedure: LEFT HEART CATHETERIZATION WITH Beatrix Fetters;  Surgeon: Blane Ohara, MD;  Location: [facility] CATH LAB;  Service: Cardiovascular;  Laterality: N/A;  . Persantine Myoview  05/06/2010   Post-rest ejection fraction 30%. No significant ischemia demonstrated. Compared to previous study there is no significant change.  . TRANSURETHRAL RESECTION OF PROSTATE     s/p    Allergies: Allergies  Allergen Reactions  . Phenazopyridine Nausea Only and Other (See Comments)    GI UPSET  . Ramipril Other (See Comments)    unk Other reaction(s): Other (See Comments), Unknown unk    Home Medications: Medications Prior to Admission  Medication Sig Dispense Refill Last Dose  . albuterol (VENTOLIN HFA) 108 (90 Base) MCG/ACT inhaler Inhale 2 puffs into the lungs 4 (four) times daily as needed. (Patient taking differently: Inhale 2 puffs into the lungs 4 (four) times daily as needed for wheezing or shortness of  breath. ) 1 Inhaler 1 prn at prn  . carvedilol (COREG) 3.125 MG tablet Take 1 tablet (3.125 mg total) by mouth 2 (two) times daily with a meal. 60 tablet 0 unknown at unknown  . ferrous sulfate 325 (65 FE) MG tablet Take 1 tablet (325 mg total) by mouth daily for 30 days. 30 tablet 0 unknown at unknown  . furosemide (LASIX) 40 MG tablet Take 1 tablet (40 mg total) by mouth daily. 90 tablet 3 unknown at unknown  . isosorbide mononitrate (IMDUR) 30 MG 24 hr  tablet Take 1 tablet (30 mg total) by mouth daily. 30 tablet 0 unknown at unknown  . losartan (COZAAR) 25 MG tablet Take 1 tablet (25 mg total) by mouth daily. 30 tablet 0 unknown at unknown  . pantoprazole (PROTONIX) 40 MG tablet Take 1 tablet (40 mg total) by mouth daily for 30 days. 30 tablet 0 unknown at unknown  . simvastatin (ZOCOR) 20 MG tablet Take 1 tablet (20 mg total) by mouth daily. 90 tablet 2 unknown at unknown  . vitamin B-12 (CYANOCOBALAMIN) 500 MCG tablet Take 1 tablet (500 mcg total) by mouth daily. 90 tablet 1 unknown at unknown  . HYDROCORTISONE, TOPICAL, 2 % LOTN Apply 40 oz topically 2 (two) times daily as needed (as needed for itching on foot). APPLY BID TO AFFECTED AREA 1 Bottle 3 unknown at unknown   Home medication reconciliation was completed with the patient.   Scheduled Inpatient Medications:   . carvedilol  3.125 mg Oral BID WC  . docusate sodium  100 mg Oral BID  . ferrous sulfate  325 mg Oral Daily  . furosemide  40 mg Oral Daily  . isosorbide mononitrate  30 mg Oral Daily  . losartan  25 mg Oral Daily  . pantoprazole (PROTONIX) IV  40 mg Intravenous Q12H  . simvastatin  20 mg Oral Daily  . vitamin B-12  500 mcg Oral Daily    Continuous Inpatient Infusions:    PRN Inpatient Medications:  acetaminophen **OR** acetaminophen, albuterol, hydrocortisone, ondansetron **OR** ondansetron (ZOFRAN) IV  Family History: family history includes Hypertension in his father.   GI Family History: Negative  Social History:   reports that he quit smoking about 33 years ago. He has never used smokeless tobacco. He reports that he does not drink alcohol or use drugs. The patient denies ETOH, tobacco, or drug use.    Review of Systems: Review of Systems - Negative except That in the history of present illness.  Physical Examination: BP 116/66   Pulse 61   Temp 98.3 F (36.8 C) (Oral)   Resp 17   Ht 6\' 2"  (1.88 m)   Wt 102.4 kg   SpO2 93%   BMI 28.98 kg/m   Physical Exam Vitals signs and nursing note reviewed.  Constitutional:      Appearance: Normal appearance.  HENT:     Head: Normocephalic and atraumatic.  Cardiovascular:     Rate and Rhythm: Normal rate and regular rhythm.     Pulses: Normal pulses.  Pulmonary:     Effort: Pulmonary effort is normal.     Breath sounds: Normal breath sounds.  Abdominal:     General: Abdomen is flat.     Palpations: Abdomen is soft.  Musculoskeletal: Normal range of motion.  Skin:    General: Skin is warm and dry.  Neurological:     Mental Status: He is alert.     Comments: Patient has sensorineural hearing loss  Psychiatric:  Mood and Affect: Mood normal.        Behavior: Behavior normal.     Data: Lab Results  Component Value Date   WBC 5.7 09/13/2018   HGB 7.7 (L) 09/13/2018   HCT 26.7 (L) 09/13/2018   MCV 80.4 09/13/2018   PLT 227 09/13/2018   Recent Labs  Lab 09/13/18 1835  HGB 7.7*   Lab Results  Component Value Date   NA 143 09/13/2018   K 4.0 09/13/2018   CL 112 (H) 09/13/2018   CO2 27 09/13/2018   BUN 32 (H) 09/13/2018   CREATININE 1.17 09/13/2018   Lab Results  Component Value Date   ALT 14 09/13/2018   AST 22 09/13/2018   ALKPHOS 90 09/13/2018   BILITOT 0.6 09/13/2018   No results for input(s): APTT, INR, PTT in the last 168 hours. CBC Latest Ref Rng & Units 09/13/2018 08/17/2018 08/16/2018  WBC 4.0 - 10.5 K/uL 5.7 5.8 5.6  Hemoglobin 13.0 - 17.0 g/dL 7.7(L) 7.8(L) 7.9(L)  Hematocrit 39.0 - 52.0 % 26.7(L) 25.9(L) 25.8(L)  Platelets 150 - 400 K/uL 227 195 212    STUDIES: No results found. @IMAGES @  Assessment: 1.  Gastrointestinal blood loss anemia-likely related to small bowel vascular malformations. 2.  Small bowel angiodysplasia. 3.  Bilateral hearing loss. 4.  Coronary artery disease status post coronary bypass x3 status post AICD implantation.  Currently stable.  COVID-19 status: Tested negative      Recommendations:  1.  Transfuse as  necessary to preserve oxygen carrying capacity for patient with history of coronary disease. 2.  EGD with small bowel enteroscopy and possible injection/cautery hemostasis.The patient understands the nature of the planned procedure, indications, risks, alternatives and potential complications including but not limited to bleeding, infection, perforation, damage to internal organs and possible oversedation/side effects from anesthesia. The patient agrees and gives consent to proceed.  Please refer to procedure notes for findings, recommendations and patient disposition/instructions.   Thank you for the consult. Please call with questions or concerns.  Olean Ree, "Lanny Hurst MD [facility] [place], [place] [address] [phone]  09/14/2018 1:01 PM

## 2018-09-14 NOTE — Progress Notes (Signed)
Saw the patient this morning.  Vital signs and lab reviewed.  Physical examination done. The patient got PRBC transfusion.  N.p.o. for EGD. Discussed with patient and RN. Time spent about 20 minutes.

## 2018-09-14 NOTE — Transfer of Care (Signed)
Immediate Anesthesia Transfer of Care Note  Patient: Thomas [name]  Procedure(s) Performed: ESOPHAGOGASTRODUODENOSCOPY (EGD) (N/A ) ENTEROSCOPY (N/A )  Patient Location: PACU  Anesthesia Type:General  Level of Consciousness: sedated  Airway & Oxygen Therapy: Patient Spontanous Breathing and Patient connected to face mask oxygen  Post-op Assessment: Report given to RN and Post -op Vital signs reviewed and stable  Post vital signs: Reviewed and stable  Last Vitals:  Vitals Value Taken Time  BP 95/52 09/14/18 1453  Temp 36 C 09/14/18 1452  Pulse 60 09/14/18 1456  Resp 27 09/14/18 1456  SpO2 92 % 09/14/18 1456  Vitals shown include unvalidated device data.  Last Pain:  Vitals:   09/14/18 1452  TempSrc: Tympanic  PainSc:          Complications: No apparent anesthesia complications

## 2018-09-14 NOTE — H&P (Signed)
Thomas [name] is an 84 y.o. male.   Chief Complaint: Melena HPI: The patient with past medical history of CAD status post bypass, cardiomyopathy status post AICD placement, paroxysmal atrial fibrillation, hypertension and chronic kidney disease presents to the emergency department complaining of weakness and fatigue.  The patient recently had a GI bleed and was taken off of Eliquis along admission to [facility] 1 month ago.  He has reportedly undergone a capsule endoscopy since that time.  However he is continued to have dark stools and was told to seek evaluation in the emergency department.  He was found to be hypotensive and have a hemoglobin of 7.7 g/dL.  Blood transfusion was ordered prior to the emergency department staff called the hospitalist service for admission.  Past Medical History:  Diagnosis Date  . AICD (automatic cardioverter/defibrillator) present   . Biventricular ICD (implantable cardioverter-defibrillator) in place 03/24/2005   Implantation of a Medtronic Adapta ADDRO1, serial number [id number]  . CHF (congestive heart failure) ([place])   . CKD (chronic kidney disease), stage III ([place])   . Coronary artery disease    a. s/p CABG 1986. b. Multiple PCIs/caths. c. 09/2013: s/p PTCA and BMS to SVG-OM.  [name]   . Dysrhythmia   . History of abdominal aortic aneurysm   . History of bleeding peptic ulcer 1980  . History of epididymitis 2013  . HTN (hypertension)   . Hydronephrosis with ureteropelvic junction obstruction   . Hydroureter on left 2009  . Hypertension   . Ischemic cardiomyopathy    a. Prior EF 30-35%, s/p BIV-ICD. b. 09/2013: EF 45-50%.  . Moderate tricuspid regurgitation   . PAF (paroxysmal atrial fibrillation) ([place])   . Presence of permanent cardiac pacemaker   . Prostate cancer ([place])   . Status post coronary artery bypass grafting 1986   LIMA to the LAD, SVG to OM, SVG to RCA  . Testicular swelling     Past Surgical History:  Procedure Laterality  Date  . 2-D echocardiogram  11/20/2011   Ejection fraction 30-35% moderate concentric left ventricular hypertrophy. Left atrium is moderately dilated. Mild MR. Mild or  . BI-VENTRICULAR IMPLANTABLE CARDIOVERTER DEFIBRILLATOR N/A 12/16/2012   Procedure: BI-VENTRICULAR IMPLANTABLE CARDIOVERTER DEFIBRILLATOR  (CRT-D);  Surgeon: [name], MD;  Location: [facility] CATH LAB;  Service: Cardiovascular;  Laterality: N/A;  . CARDIAC CATHETERIZATION  12/10/2011   SVG to OM widely patent.  LIMA to LAD patent  . CATARACT EXTRACTION W/PHACO Right 10/12/2017   Procedure: CATARACT EXTRACTION PHACO AND INTRAOCULAR LENS PLACEMENT (IOC);  Surgeon: Birder Robson, MD;  Location: [facility] ORS;  Service: Ophthalmology;  Laterality: Right;  [place] 00:57 AP% 15.9 CDE 9.07 Fluid pack lot # [id number]  . COLONOSCOPY N/A 07/13/2018   Procedure: COLONOSCOPY;  Surgeon: Toledo, Benay Pike, MD;  Location: [facility] ENDOSCOPY;  Service: Gastroenterology;  Laterality: N/A;  . CORONARY ARTERY BYPASS GRAFT  1986  . ESOPHAGOGASTRODUODENOSCOPY N/A 07/13/2018   Procedure: ESOPHAGOGASTRODUODENOSCOPY (EGD);  Surgeon: Toledo, Benay Pike, MD;  Location: [facility] ENDOSCOPY;  Service: Gastroenterology;  Laterality: N/A;  . ESOPHAGOGASTRODUODENOSCOPY (EGD) WITH PROPOFOL N/A 05/27/2018   Procedure: ESOPHAGOGASTRODUODENOSCOPY (EGD) WITH PROPOFOL;  Surgeon: Clarene Essex, MD;  Location: [place];  Service: Endoscopy;  Laterality: N/A;  . INSERT / REPLACE / REMOVE PACEMAKER    . LEFT HEART CATHETERIZATION WITH CORONARY/GRAFT ANGIOGRAM N/A 12/10/2011   Procedure: LEFT HEART CATHETERIZATION WITH Beatrix Fetters;  Surgeon: Sanda Klein, MD;  Location: [place] CATH LAB;  Service: Cardiovascular;  Laterality: N/A;  .  LEFT HEART CATHETERIZATION WITH CORONARY/GRAFT ANGIOGRAM N/A 09/25/2013   Procedure: LEFT HEART CATHETERIZATION WITH Beatrix Fetters;  Surgeon: Blane Ohara, MD;  Location: [facility] C[facility] LAB;  Service: Cardiovascular;  Laterality: N/A;  .  Persantine Myoview  05/06/2010   Post-rest ejection fraction 30%. No significant ischemia demonstrated. Compared to previous study there is no significant change.  . TRANSURETHRAL RESECTION OF PROSTATE     s/p    Family History  Problem Relation Age of Onset  . Hypertension Father    Social History:  reports that he quit smoking about 33 years ago. He has never used smokeless tobacco. He reports that he does not drink alcohol or use drugs.  Allergies:  Allergies  Allergen Reactions  . Phenazopyridine Nausea Only and Other (See Comments)    GI UPSET  . Ramipril Other (See Comments)    unk Other reaction(s): Other (See Comments), Unknown unk    Medications Prior to Admission  Medication Sig Dispense Refill  . albuterol (VENTOLIN HFA) 108 (90 Base) MCG/ACT inhaler Inhale 2 puffs into the lungs 4 (four) times daily as needed. (Patient taking differently: Inhale 2 puffs into the lungs 4 (four) times daily as needed for wheezing or shortness of breath. ) 1 Inhaler 1  . carvedilol (COREG) 3.125 MG tablet Take 1 tablet (3.125 mg total) by mouth 2 (two) times daily with a meal. 60 tablet 0  . ferrous sulfate 325 (65 FE) MG tablet Take 1 tablet (325 mg total) by mouth daily for 30 days. 30 tablet 0  . furosemide (LASIX) 40 MG tablet Take 1 tablet (40 mg total) by mouth daily. 90 tablet 3  . isosorbide mononitrate (IMDUR) 30 MG 24 hr tablet Take 1 tablet (30 mg total) by mouth daily. 30 tablet 0  . losartan (COZAAR) 25 MG tablet Take 1 tablet (25 mg total) by mouth daily. 30 tablet 0  . pantoprazole (PROTONIX) 40 MG tablet Take 1 tablet (40 mg total) by mouth daily for 30 days. 30 tablet 0  . simvastatin (ZOCOR) 20 MG tablet Take 1 tablet (20 mg total) by mouth daily. 90 tablet 2  . vitamin B-12 (CYANOCOBALAMIN) 500 MCG tablet Take 1 tablet (500 mcg total) by mouth daily. 90 tablet 1  . HYDROCORTISONE, TOPICAL, 2 % LOTN Apply 40 oz topically 2 (two) times daily as needed (as needed for itching  on foot). APPLY BID TO AFFECTED AREA 1 Bottle 3    Results for orders placed or performed during the hospital encounter of 09/13/18 (from the past 48 hour(s))  Comprehensive metabolic panel     Status: Abnormal   Collection Time: 09/13/18  6:35 PM  Result Value Ref Range   Sodium 143 135 - 145 mmol/L   Potassium 4.0 3.5 - 5.1 mmol/L   Chloride 112 (H) 98 - 111 mmol/L   CO2 27 22 - 32 mmol/L   Glucose, Bld 111 (H) 70 - 99 mg/dL   BUN 32 (H) 8 - 23 mg/dL   Creatinine, Ser 1.17 0.61 - 1.24 mg/dL   Calcium 8.2 (L) 8.9 - 10.3 mg/dL   Total Protein 5.0 (L) 6.5 - 8.1 g/dL   Albumin 2.6 (L) 3.5 - 5.0 g/dL   AST 22 15 - 41 U/L   ALT 14 0 - 44 U/L   Alkaline Phosphatase 90 38 - 126 U/L   Total Bilirubin 0.6 0.3 - 1.2 mg/dL   GFR calc non Af Amer 58 (L) >60 mL/min   GFR calc Af Amer >  60 >60 mL/min   Anion gap 4 (L) 5 - 15    Comment: Performed at [facility], [address] [place], [place] [address]  CBC     Status: Abnormal   Collection Time: 09/13/18  6:35 PM  Result Value Ref Range   WBC 5.7 4.0 - 10.5 K/uL   RBC 3.32 (L) 4.22 - 5.81 MIL/uL   Hemoglobin 7.7 (L) 13.0 - 17.0 g/dL   HCT 26.7 (L) 39.0 - 52.0 %   MCV 80.4 80.0 - 100.0 fL   MCH 23.2 (L) 26.0 - 34.0 pg   MCHC 28.8 (L) 30.0 - 36.0 g/dL   RDW 21.5 (H) 11.5 - 15.5 %   Platelets 227 150 - 400 K/uL   nRBC 0.0 0.0 - 0.2 %    Comment: Performed at [facility], [address] [place], [place] [address]  Type and screen [place]     Status: None (Preliminary result)   Collection Time: 09/13/18  6:35 PM  Result Value Ref Range   ABO/RH(D) O POS    Antibody Screen POS    Sample Expiration 09/16/2018,2359    Antibody Identification ANTI c    Unit Number [id number]    Blood Component Type RED CELLS,LR    Unit division 00    Status of Unit ISSUED    Transfusion Status OK TO TRANSFUSE    Crossmatch Result COMPATIBLE    Donor AG Type      NEGATIVE FOR E ANTIGEN NEGATIVE FOR c  ANTIGEN Performed at [facility], [address] [place], [place] [address]    Unit Number [id number]    Blood Component Type RED CELLS,LR    Unit division 00    Status of Unit ISSUED    Transfusion Status OK TO TRANSFUSE    Crossmatch Result COMPATIBLE    Donor AG Type NEGATIVE FOR E ANTIGEN NEGATIVE FOR c ANTIGEN   TSH     Status: Abnormal   Collection Time: 09/13/18  6:35 PM  Result Value Ref Range   TSH 4.939 (H) 0.350 - 4.500 uIU/mL    Comment: Performed by a 3rd Generation assay with a functional sensitivity of <=0.01 uIU/mL. Performed at [facility], [address] [place], [place] [address]   SARS Coronavirus 2 (CEPHEID- Performed in [facility] hospital lab), Hosp Order     Status: None   Collection Time: 09/13/18 10:24 PM   Specimen: Nasopharyngeal Swab  Result Value Ref Range   SARS Coronavirus 2 NEGATIVE NEGATIVE    Comment: (NOTE) If result is NEGATIVE SARS-CoV-2 target nucleic acids are NOT DETECTED. The SARS-CoV-2 RNA is generally detectable in upper and lower  respiratory specimens during the acute phase of infection. The lowest  concentration of SARS-CoV-2 viral copies this assay can detect is 250  copies / mL. A negative result does not preclude SARS-CoV-2 infection  and should not be used as the sole basis for treatment or other  patient management decisions.  A negative result may occur with  improper specimen collection / handling, submission of specimen other  than nasopharyngeal swab, presence of viral mutation(s) within the  areas targeted by this assay, and inadequate number of viral copies  (<250 copies / mL). A negative result must be combined with clinical  observations, patient history, and epidemiological information. If result is POSITIVE SARS-CoV-2 target nucleic acids are DETECTED. The SARS-CoV-2 RNA is generally detectable in upper and lower  respiratory specimens dur ing the  acute phase of infection.  Positive   results are indicative of active infection with SARS-CoV-2.  Clinical  correlation with patient history and other diagnostic information is  necessary to determine patient infection status.  Positive results do  not rule out bacterial infection or co-infection with other viruses. If result is PRESUMPTIVE POSTIVE SARS-CoV-2 nucleic acids MAY BE PRESENT.   A presumptive positive result was obtained on the submitted specimen  and confirmed on repeat testing.  While 2019 novel coronavirus  (SARS-CoV-2) nucleic acids may be present in the submitted sample  additional confirmatory testing may be necessary for epidemiological  and / or clinical management purposes  to differentiate between  SARS-CoV-2 and other Sarbecovirus currently known to infect humans.  If clinically indicated additional testing with an alternate test  methodology [phone]) is advised. The SARS-CoV-2 RNA is generally  detectable in upper and lower respiratory sp ecimens during the acute  phase of infection. The expected result is Negative. Fact Sheet for Patients:  StrictlyIdeas.no Fact Sheet for Healthcare Providers: BankingDealers.co.za This test is not yet approved or cleared by the [place] FDA and has been authorized for detection and/or diagnosis of SARS-CoV-2 by FDA under an Emergency Use Authorization (EUA).  This EUA will remain in effect (meaning this test can be used) for the duration of the COVID-19 declaration under Section 564(b)(1) of the Act, 21 U.S.C. section 360bbb-3(b)(1), unless the authorization is terminated or revoked sooner. Performed at [facility], [address] [place], [place] [address]   Prepare RBC     Status: None   Collection Time: 09/13/18 10:30 PM  Result Value Ref Range   Order Confirmation      ORDER PROCESSED BY BLOOD BANK Performed at [facility], [address] [place], [place] [address]    No  results found.  Review of Systems  Constitutional: Negative for chills and fever.  HENT: Negative for sore throat and tinnitus.   Eyes: Negative for blurred vision and redness.  Respiratory: Negative for cough and shortness of breath.   Cardiovascular: Negative for chest pain, palpitations, orthopnea and PND.  Gastrointestinal: Positive for melena. Negative for abdominal pain, diarrhea, nausea and vomiting.  Genitourinary: Negative for dysuria, frequency and urgency.  Musculoskeletal: Negative for joint pain and myalgias.  Skin: Negative for rash.       No lesions  Neurological: Negative for speech change, focal weakness and weakness.  Endo/Heme/Allergies: Does not bruise/bleed easily.       No temperature intolerance  Psychiatric/Behavioral: Negative for depression and suicidal ideas.    Blood pressure 114/66, pulse 61, temperature 98.5 F (36.9 C), temperature source Oral, resp. rate 20, height 6\' 2"  (1.88 m), weight 102.4 kg, SpO2 95 %. Physical Exam  Vitals reviewed. Constitutional: He is oriented to person, place, and time. He appears well-developed and well-nourished. No distress.  HENT:  Head: Normocephalic and atraumatic.  Mouth/Throat: Oropharynx is clear and moist.  Eyes: Pupils are equal, round, and reactive to light. Conjunctivae and EOM are normal. No scleral icterus.  Neck: Normal range of motion. Neck supple. No JVD present. No tracheal deviation present. No thyromegaly present.  Cardiovascular: Normal rate, regular rhythm and normal heart sounds. Exam reveals no gallop and no friction rub.  No murmur heard. Respiratory: Effort normal and breath sounds normal. No respiratory distress. He has no wheezes.  GI: Soft. Bowel sounds are normal. He exhibits no distension. There is no abdominal tenderness.  Genitourinary:    Genitourinary Comments: Deferred  Musculoskeletal: Normal range of motion.        General: No edema.  Lymphadenopathy:    He has no cervical  adenopathy.  Neurological: He is alert and oriented to person, place, and time. No cranial nerve deficit.  Skin: Skin is warm and dry. No rash noted. No erythema.  Psychiatric: He has a normal mood and affect. His behavior is normal. Judgment and thought content normal.     Assessment/Plan This is an 84 year old male admitted for GI bleed. 1.  GI bleed: Initially hypotensive but now stabilized.  Currently receiving 2 units red blood cells.  Maintain 2 large-bore IVs.  Continue to monitor telemetry.  Consult GI. 2.  Hypertension: Controlled; continue carvedilol and losartan 3.  CAD: Stable; monitor telemetry.  Continue Imdur 4.  CHF: Chronic; systolic.  Continue Lasix 5.  Hyperlipidemia: Continue statin therapy 6.  DVT prophylaxis: SCDs 7.  GI prophylaxis: Pantoprazole per home regimen The patient is a full code.  Time spent on admission orders and patient care approximately 45 minutes  Harrie Foreman, MD 09/14/2018, 8:59 AM

## 2018-09-15 ENCOUNTER — Encounter: Payer: Self-pay | Admitting: Internal Medicine

## 2018-09-15 LAB — BASIC METABOLIC PANEL
Anion gap: 4 — ABNORMAL LOW (ref 5–15)
BUN: 29 mg/dL — ABNORMAL HIGH (ref 8–23)
CO2: 25 mmol/L (ref 22–32)
Calcium: 8 mg/dL — ABNORMAL LOW (ref 8.9–10.3)
Chloride: 111 mmol/L (ref 98–111)
Creatinine, Ser: 1.23 mg/dL (ref 0.61–1.24)
GFR calc Af Amer: >60 mL/min (ref >60)
GFR calc non Af Amer: 55 mL/min — ABNORMAL LOW (ref >60)
Glucose, Bld: 94 mg/dL (ref 70–99)
Potassium: 3.8 mmol/L (ref 3.5–5.1)
Sodium: 140 mmol/L (ref 135–145)

## 2018-09-15 LAB — HEMOGLOBIN: Hemoglobin: 8.2 g/dL — ABNORMAL LOW (ref 13.0–17.0)

## 2018-09-15 LAB — MAGNESIUM: Magnesium: 2.2 mg/dL (ref 1.7–2.4)

## 2018-09-15 MED ORDER — PANTOPRAZOLE SODIUM 40 MG PO TBEC
40.0000 mg | DELAYED_RELEASE_TABLET | Freq: Two times a day (BID) | ORAL | Status: DC
  Administered 2018-09-15 – 2018-09-16 (×2): 40 mg via ORAL
  Filled 2018-09-15 (×2): qty 1

## 2018-09-15 NOTE — Evaluation (Signed)
Physical Therapy Evaluation Patient Details Name: Thomas [name] MRN: [id number] DOB: Jan 16, 1935 Today's Date: 09/15/2018   History of Present Illness  84 y.o. male with CAD status post bypass, cardiomyopathy status post AICD placement, paroxysmal atrial fibrillation, hypertension and chronic kidney disease presents to the emergency department complaining of weakness and fatigue, pt is deaf.  Found to have low BP and GI bleed.  Clinical Impression  Pt is able to ambulate with good safety and confidence using SPC, which is his baseline.  His HR remained in the 80s-90s t/o the effort and O2 was in the 90s.  Pt felt good about his effort and going home, no questions.  Will complete PT orders, pt is safe to return home from PT perspective.    Follow Up Recommendations No PT follow up    Equipment Recommendations  None recommended by PT    Recommendations for Other Services       Precautions / Restrictions Precautions Precautions: Fall Restrictions Weight Bearing Restrictions: No      Mobility  Bed Mobility               General bed mobility comments: in recliner on arrival, returned to recliner, likely would have had no issues  Transfers Overall transfer level: Independent Equipment used: Rolling walker (2 wheeled)             General transfer comment: Pt is able to easily rise to standing w/o assist  Ambulation/Gait Ambulation/Gait assistance: Independent Gait Distance (Feet): 200 Feet Assistive device: Straight cane       General Gait Details: Pt walks with easy confidence, community appropriate speed and no LOBs or safety issues  Stairs            Wheelchair Mobility    Modified Rankin (Stroke Patients Only)       Balance Overall balance assessment: Independent                                           Pertinent Vitals/Pain Pain Assessment: No/denies pain Pain Score: (pt gives "OK" sign when cued about pain)    Home  Living Family/patient expects to be discharged to:: Private residence Living Arrangements: Spouse/significant other;Children Available Help at Discharge: Family;Available 24 hours/day Type of Home: House Home Access: Level entry     Home Layout: One level Home Equipment: Walker - 2 wheels Additional Comments: Lives with wife and adult son     Prior Function Level of Independence: Independent with assistive device(s)         Comments: Mod indep with SPC. Drives. Wife travels to all appointments with him     Hand Dominance        Extremity/Trunk Assessment   Upper Extremity Assessment Upper Extremity Assessment: Overall WFL for tasks assessed    Lower Extremity Assessment Lower Extremity Assessment: Overall WFL for tasks assessed       Communication   Communication: Prefers language other than English(deaf, sign language)  Cognition Arousal/Alertness: Awake/alert Behavior During Therapy: WFL for tasks assessed/performed Overall Cognitive Status: Within Functional Limits for tasks assessed                                        General Comments      Exercises     Assessment/Plan  PT Assessment Patent does not need any further PT services  PT Problem List         PT Treatment Interventions      PT Goals (Current goals can be found in the Care Plan section)  Acute Rehab PT Goals Patient Stated Goal: go home PT Goal Formulation: All assessment and education complete, DC therapy    Frequency     Barriers to discharge        Co-evaluation               AM-PAC PT "6 Clicks" Mobility  Outcome Measure Help needed turning from your back to your side while in a flat bed without using bedrails?: None Help needed moving from lying on your back to sitting on the side of a flat bed without using bedrails?: None Help needed moving to and from a bed to a chair (including a wheelchair)?: None Help needed standing up from a chair using  your arms (e.g., wheelchair or bedside chair)?: None Help needed to walk in hospital room?: None Help needed climbing 3-5 steps with a railing? : None 6 Click Score: 24    End of Session Equipment Utilized During Treatment: Gait belt Activity Tolerance: Patient tolerated treatment well Patient left: with chair alarm set;with call bell/phone within reach   PT Visit Diagnosis: Unsteadiness on feet (R26.81);Difficulty in walking, not elsewhere classified (R26.2)    Time: 9191-6606 PT Time Calculation (min) (ACUTE ONLY): 18 min   Charges:   PT Evaluation $PT Eval Low Complexity: 1 Low          Kreg Shropshire, DPT 09/15/2018, 12:30 PM

## 2018-09-15 NOTE — Anesthesia Postprocedure Evaluation (Signed)
Anesthesia Post Note  Patient: Thomas [name]  Procedure(s) Performed: ESOPHAGOGASTRODUODENOSCOPY (EGD) (N/A ) ENTEROSCOPY (N/A )  Patient location during evaluation: Endoscopy Anesthesia Type: General Level of consciousness: awake and alert Pain management: pain level controlled Vital Signs Assessment: post-procedure vital signs reviewed and stable Respiratory status: spontaneous breathing, nonlabored ventilation, respiratory function stable and patient connected to nasal cannula oxygen Cardiovascular status: blood pressure returned to baseline and stable Postop Assessment: no apparent nausea or vomiting Anesthetic complications: no     Last Vitals:  Vitals:   09/15/18 0947 09/15/18 1259  BP: 116/60 94/63  Pulse: 64 62  Resp:  16  Temp:  36.5 C  SpO2: 97% 96%    Last Pain:  Vitals:   09/15/18 1500  TempSrc:   PainSc: 0-No pain                 [name] S

## 2018-09-15 NOTE — Progress Notes (Signed)
[facility] [place] NAME: Thomas [name]    MR#:  [id number]  DATE OF BIRTH:  1934-04-23  SUBJECTIVE:  CHIEF COMPLAINT:   Chief Complaint  Patient presents with  . GI Bleeding   Bloody stool per patient REVIEW OF SYSTEMS:  Review of Systems  Constitutional: Negative for chills, fever and malaise/fatigue.  HENT: Negative for sore throat.   Eyes: Negative for blurred vision and double vision.  Respiratory: Negative for cough, hemoptysis, shortness of breath, wheezing and stridor.   Cardiovascular: Negative for chest pain, palpitations, orthopnea and leg swelling.  Gastrointestinal: Positive for blood in stool. Negative for abdominal pain, diarrhea, melena, nausea and vomiting.  Genitourinary: Negative for dysuria, flank pain and hematuria.  Musculoskeletal: Negative for back pain.  Neurological: Negative for dizziness, sensory change, focal weakness, seizures, loss of consciousness, weakness and headaches.  Endo/Heme/Allergies: Negative for polydipsia.    DRUG ALLERGIES:   Allergies  Allergen Reactions  . Phenazopyridine Nausea Only and Other (See Comments)    GI UPSET  . Ramipril Other (See Comments)    unk Other reaction(s): Other (See Comments), Unknown unk   VITALS:  Blood pressure 94/63, pulse 62, temperature 97.7 F (36.5 C), temperature source Oral, resp. rate 16, height 6\' 2"  (1.88 m), weight 103.3 kg, SpO2 96 %. PHYSICAL EXAMINATION:  Physical Exam HENT:     Head: Normocephalic.     Mouth/Throat:     Mouth: Mucous membranes are moist.  Eyes:     General: No scleral icterus.    Conjunctiva/sclera: Conjunctivae normal.     Pupils: Pupils are equal, round, and reactive to light.  Neck:     Musculoskeletal: Normal range of motion and neck supple.     Vascular: No JVD.     Trachea: No tracheal deviation.  Cardiovascular:     Rate and Rhythm: Normal rate and regular rhythm.     Heart sounds: Normal heart sounds.  No murmur. No gallop.   Pulmonary:     Effort: Pulmonary effort is normal. No respiratory distress.     Breath sounds: Normal breath sounds. No wheezing or rales.  Abdominal:     General: Bowel sounds are normal. There is no distension.     Palpations: Abdomen is soft.     Tenderness: There is no abdominal tenderness. There is no rebound.  Musculoskeletal: Normal range of motion.        General: No tenderness.  Skin:    Findings: No erythema or rash.  Neurological:     General: No focal deficit present.     Mental Status: He is alert and oriented to person, place, and time.     Cranial Nerves: No cranial nerve deficit.  Psychiatric:        Mood and Affect: Mood normal.    LABORATORY PANEL:  Male CBC Recent Labs  Lab 09/13/18 1835 09/15/18 0433  WBC 5.7  --   HGB 7.7* 8.2*  HCT 26.7*  --   PLT 227  --    ------------------------------------------------------------------------------------------------------------------ Chemistries  Recent Labs  Lab 09/13/18 1835 09/15/18 0433  NA 143 140  K 4.0 3.8  CL 112* 111  CO2 27 25  GLUCOSE 111* 94  BUN 32* 29*  CREATININE 1.17 1.23  CALCIUM 8.2* 8.0*  MG  --  2.2  AST 22  --   ALT 14  --   ALKPHOS 90  --   BILITOT 0.6  --    RADIOLOGY:  No results found. ASSESSMENT AND PLAN:   This is an 84 year old male admitted for GI bleed. 1.  GI bleed: Initially hypotensive but now stabilized.   EGD: A single recently bleeding angioectasia in stomach. Treated with bipolar cautery. Two recently bleeding angioectasias in the duodenum. Injected. Treated with bipolar cautery. Multiple non-bleeding angioectasias in the duodenum. Treated with bipolar cautery. Duodenitis. A single non-bleeding angioectasia in the jejunum. Treated with bipolar cautery.  The patient still has a little bloody stool today.  Anemia due to acute blood loss.  Received 2 units red blood cells.  Hemoglobin up to 8.2 today.  Follow hemoglobin tomorrow.  2.   Hypertension: Controlled; continue carvedilol and losartan 3.  CAD: Stable; monitor telemetry.  Continue Imdur 4.  CHF: Chronic; systolic ejection fraction 35 to 40%.  Continue Lasix 5.  Hyperlipidemia: Continue statin therapy 6.  DVT prophylaxis: SCDs 7.  GI prophylaxis: Pantoprazole per home regimen All the records are reviewed and case discussed with Care Management/Social Worker. Management plans discussed with the patient, his wife and they are in agreement.  CODE STATUS: Full Code  TOTAL TIME TAKING CARE OF THIS PATIENT: 26 minutes.   More than 50% of the time was spent in counseling/coordination of care: YES  POSSIBLE D/C IN 1-2 DAYS, DEPENDING ON CLINICAL CONDITION.   Demetrios Loll M.D on 09/15/2018 at 1:41 PM  Between 7am to 6pm - Pager - [phone]  After 6pm go to [email or web address] - [facility]

## 2018-09-16 LAB — TYPE AND SCREEN
ABO/RH(D): O POS
Antibody Screen: POSITIVE
Donor AG Type: NEGATIVE
Donor AG Type: NEGATIVE
Unit division: 0
Unit division: 0
Unit division: 0
Unit division: 0

## 2018-09-16 LAB — BPAM RBC
Blood Product Expiration Date: 202007152359
Blood Product Expiration Date: 202007242359
Blood Product Expiration Date: 202007292359
Blood Product Expiration Date: 202008012359
ISSUE DATE / TIME: 202007010038
ISSUE DATE / TIME: 202007010615
ISSUE DATE / TIME: 202007021935
Unit Type and Rh: 5100
Unit Type and Rh: 5100
Unit Type and Rh: 5100
Unit Type and Rh: 5100

## 2018-09-16 LAB — T4: T4, Total: 6.9 ug/dL (ref 4.5–12.0)

## 2018-09-16 LAB — HEMOGLOBIN: Hemoglobin: 8.3 g/dL — ABNORMAL LOW (ref 13.0–17.0)

## 2018-09-16 LAB — PREPARE RBC (CROSSMATCH)

## 2018-09-16 MED ORDER — PANTOPRAZOLE SODIUM 40 MG PO TBEC: 40.0000 mg | DELAYED_RELEASE_TABLET | Freq: Two times a day (BID) | ORAL | 0 refills | Status: DC

## 2018-09-16 NOTE — Progress Notes (Signed)
Dc instructions carefullly gone over with pt and his wife using interpreter on a stick.  They need a work note. Have paged dr Bridgett Larsson

## 2018-09-16 NOTE — Progress Notes (Signed)
Work note given. Dc home via wc. Wife at bedsdie

## 2018-09-16 NOTE — Discharge Instructions (Signed)
Gastrointestinal Bleeding °Gastrointestinal (GI) bleeding is bleeding somewhere along the digestive tract, between the mouth and the anus. This tract includes the mouth, esophagus, stomach, small intestine, large intestine, and anus. The large intestine is often called the colon. °GI bleeding can be caused by various problems. The severity of these problems can range from mild to serious or even life-threatening. If you have GI bleeding, you may find blood in your stools (feces), you may have black stools, or you may vomit blood. If there is a lot of bleeding, you may need to stay in the hospital. °What are the causes? °This condition may be caused by: °· Inflammation, irritation, or swelling of the esophagus (esophagitis). The esophagus is part of the body that moves food from your mouth to your stomach. °· Swollen veins in the rectum (hemorrhoids). °· Areas of painful tearing in the anus that are often caused by passing hard stool (anal fissures). °· Pouches that form on the colon over time, with age, and may bleed a lot (diverticulosis). °· Inflammation (diverticulitis) in areas with diverticulosis. This can cause pain, fever, and bloody stools, although bleeding may be mild. °· Growths (polyps) or cancer. Colon cancer often starts out as precancerous polyps. °· Gastritis and ulcers. With these, bleeding may come from the upper GI tract, near the stomach. °What increases the risk? °You are more likely to develop this condition if you: °· Have an infection in your stomach from a type of bacteria called Helicobacter pylori. °· Take certain medicines, such as: °? NSAIDs. °? Aspirin. °? Selective serotonin reuptake inhibitors (SSRIs). °? Steroids. °? Antiplatelet or anticoagulant medicines. °· Smoke. °· Drink alcohol. °What are the signs or symptoms? °Common symptoms of this condition include: °· Bright red blood in your vomit, or vomit that looks like coffee grounds. °· Bloody, black, or tarry stools. °? Bleeding  from the lower GI tract will usually cause red or maroon blood in the stools. °? Bleeding from the upper GI tract may cause black, tarry stools that are often stronger smelling than usual. °? In certain cases, if the bleeding is fast enough, the stools may be red. °· Pain or cramping in the abdomen. °How is this diagnosed? °This condition may be diagnosed based on: °· Your medical history and a physical exam. °· Various tests, such as: °? Blood tests. °? Stool tests. °? X-rays and other imaging tests. °? Esophagogastroduodenoscopy (EGD). In this test, a flexible, lighted tube is used to look at your esophagus, stomach, and small intestine. °? Colonoscopy. In this test, a flexible, lighted tube is used to look at your colon. °How is this treated? °Treatment for this condition depends on the cause of the bleeding. For example: °· For bleeding from the esophagus, stomach, small intestine, or colon, the health care provider doing your EGD or colonoscopy may be able to stop the bleeding as part of the procedure. °· Inflammation or infection of the colon can be treated with medicines. °· Certain rectal problems can be treated with creams, suppositories, or warm baths. °· Medicines may be given to reduce acid in your stomach. °· Surgery is sometimes needed. °· Blood transfusions are sometimes needed if a lot of blood has been lost. °If bleeding is mild, you may be allowed to go home. If there is a lot of bleeding, you will need to stay in the hospital for observation. °Follow these instructions at home: ° °· Take over-the-counter and prescription medicines only as told by your health care provider. °·   Eat foods that are high in fiber, such as beans, whole grains, and fresh fruits and vegetables. This will help to keep your stools soft. Eating 1-3 prunes each day works well for many people. °· Drink enough fluid to keep your urine pale yellow. °· Keep all follow-up visits as told by your health care provider. This is  important. °Contact a health care provider if: °· Your symptoms do not improve. °Get help right away if: °· Your bleeding does not stop. °· You feel light-headed or you faint. °· You feel weak. °· You have severe cramps in your back or abdomen. °· You pass large blood clots in your stool. °· Your symptoms are getting worse. °· You have chest pain or fast heartbeats. °Summary °· Gastrointestinal (GI) bleeding is bleeding somewhere along the digestive tract, between the mouth and anus. GI bleeding can be caused by various problems. The severity of these problems can range from mild to serious or even life-threatening. °· Treatment for this condition depends on the cause of the bleeding. °· Take over-the-counter and prescription medicines only as told by your health care provider. °· Keep all follow-up visits as told by your health care provider. This is important. °· Get help right away if your bleeding increases, your symptoms are getting worse, or you have new symptoms. °This information is not intended to replace advice given to you by your health care provider. Make sure you discuss any questions you have with your health care provider. °Document Released: 02/28/2000 Document Revised: 10/13/2017 Document Reviewed: 10/13/2017 °Elsevier Patient Education © 2020 Elsevier Inc. ° °

## 2018-09-16 NOTE — Progress Notes (Signed)
[place] [facility] [place] was admitted to the Hospital on 09/13/2018 and Discharged  09/16/2018 and should be excused from work/school   for 7  days starting 09/13/2018 , may return to work/school without any restrictions.  Demetrios Loll M.D on 09/16/2018,at 12:41 PM  [place] [facility]  [phone]

## 2018-09-16 NOTE — Discharge Summary (Signed)
[facility] [place] NAME: Thomas [name]    MR#:  [id number]  [place] OF BIRTH:  February 09, 1935  DATE OF ADMISSION:  09/13/2018   ADMITTING PHYSICIAN: Harrie Foreman, MD  DATE OF DISCHARGE:09/16/2018 PRIMARY CARE PHYSICIAN: [name], MD   ADMISSION DIAGNOSIS:  UGIB (upper gastrointestinal bleed) [K92.2] Symptomatic anemia [D64.9] DISCHARGE DIAGNOSIS:  Active Problems:   BP (high blood pressure)   GIB (gastrointestinal bleeding)  SECONDARY DIAGNOSIS:   Past Medical History:  Diagnosis Date   AICD (automatic cardioverter/defibrillator) present    Biventricular ICD (implantable cardioverter-defibrillator) in place 03/24/2005   Implantation of a Medtronic Adapta ADDRO1, serial number [id number]   CHF (congestive heart failure) (HCC)    CKD (chronic kidney disease), stage III ([place])    Coronary artery disease    a. s/p CABG 1986. b. Multiple PCIs/caths. c. 09/2013: s/p PTCA and BMS to SVG-OM.   Deaf    Dysrhythmia    History of abdominal aortic aneurysm    History of bleeding peptic ulcer 1980   History of epididymitis 2013   HTN (hypertension)    Hydronephrosis with ureteropelvic junction obstruction    Hydroureter on left 2009   Hypertension    Ischemic cardiomyopathy    a. Prior EF 30-35%, s/p BIV-ICD. b. 09/2013: EF 45-50%.   Moderate tricuspid regurgitation    PAF (paroxysmal atrial fibrillation) (HCC)    Presence of permanent cardiac pacemaker    Prostate cancer [facility])    Status post coronary artery bypass grafting 1986   LIMA to the LAD, SVG to OM, SVG to RCA   Testicular swelling    HOSPITAL COURSE:  This is an 84 year old male admitted for GI bleed. 1. GI bleed: Initially hypotensive but now stabilized.  EGD: A single recently bleeding angioectasia in stomach. Treated with bipolar cautery. Two recently bleeding angioectasias in the duodenum.Injected. Treated with bipolar cautery. Multiple  non-bleeding angioectasias in the duodenum. Treated with bipolar cautery. Duodenitis. A single non-bleeding angioectasia in the jejunum. Treated with bipolar cautery.  The patient still has no bloody stool since yesterday.  Continue Protonix p.o.  Anemia due to acute blood loss.  Received 2 units red blood cells.  Hemoglobin up to 8.2 today.  Follow hemoglobin 8.3 today.  2. Hypertension: Controlled; continue carvedilol and losartan 3.CAD: Stable; monitor telemetry. Continue Imdur 4. CHF: Chronic; systolic ejection fraction 35 to 40%. Continue Lasix 5. Hyperlipidemia: Continue statin therapy DISCHARGE CONDITIONS:  Stable, discharged home today. CONSULTS OBTAINED:   DRUG ALLERGIES:   Allergies  Allergen Reactions   Phenazopyridine Nausea Only and Other (See Comments)    GI UPSET   Ramipril Other (See Comments)    unk Other reaction(s): Other (See Comments), Unknown unk   DISCHARGE MEDICATIONS:   Allergies as of 09/16/2018      Reactions   Phenazopyridine Nausea Only, Other (See Comments)   GI UPSET   Ramipril Other (See Comments)   unk Other reaction(s): Other (See Comments), Unknown unk      Medication List    TAKE these medications   albuterol 108 (90 Base) MCG/ACT inhaler Commonly known as: VENTOLIN HFA Inhale 2 puffs into the lungs 4 (four) times daily as needed. What changed: reasons to take this   carvedilol 3.125 MG tablet Commonly known as: COREG Take 1 tablet (3.125 mg total) by mouth 2 (two) times daily with a meal.   ferrous sulfate 325 (65 FE) MG tablet Take 1 tablet (325 mg total) by  mouth daily for 30 days.   furosemide 40 MG tablet Commonly known as: LASIX Take 1 tablet (40 mg total) by mouth daily.   HYDROCORTISONE (TOPICAL) 2 % Lotn Apply 40 oz topically 2 (two) times daily as needed (as needed for itching on foot). APPLY BID TO AFFECTED AREA   isosorbide mononitrate 30 MG 24 hr tablet Commonly known as: IMDUR Take 1 tablet (30 mg  total) by mouth daily.   losartan 25 MG tablet Commonly known as: COZAAR Take 1 tablet (25 mg total) by mouth daily.   pantoprazole 40 MG tablet Commonly known as: PROTONIX Take 1 tablet (40 mg total) by mouth 2 (two) times daily before a meal. What changed: when to take this   simvastatin 20 MG tablet Commonly known as: ZOCOR Take 1 tablet (20 mg total) by mouth daily.   vitamin B-12 500 MCG tablet Commonly known as: CYANOCOBALAMIN Take 1 tablet (500 mcg total) by mouth daily.        DISCHARGE INSTRUCTIONS:  See AVS  If you experience worsening of your admission symptoms, develop shortness of breath, life threatening emergency, suicidal or homicidal thoughts you must seek medical attention immediately by calling 911 or calling your MD immediately  if symptoms less severe.  You Must read complete instructions/literature along with all the possible adverse reactions/side effects for all the Medicines you take and that have been prescribed to you. Take any new Medicines after you have completely understood and accpet all the possible adverse reactions/side effects.   Please note  You were cared for by a hospitalist during your hospital stay. If you have any questions about your discharge medications or the care you received while you were in the hospital after you are discharged, you can call the unit and asked to speak with the hospitalist on call if the hospitalist that took care of you is not available. Once you are discharged, your primary care physician will handle any further medical issues. Please note that NO REFILLS for any discharge medications will be authorized once you are discharged, as it is imperative that you return to your primary care physician (or establish a relationship with a primary care physician if you do not have one) for your aftercare needs so that they can reassess your need for medications and monitor your lab values.    On the day of Discharge:    VITAL SIGNS:  Blood pressure 122/66, pulse 85, temperature 98 F (36.7 C), temperature source Oral, resp. rate 20, height 6\' 2"  (1.88 m), weight 102.7 kg, SpO2 94 %. PHYSICAL EXAMINATION:  GENERAL:  84 y.o.-year-old patient lying in the bed with no acute distress.  EYES: Pupils equal, round, reactive to light and accommodation. No scleral icterus. Extraocular muscles intact.  HEENT: Head atraumatic, normocephalic. Oropharynx and nasopharynx clear.  NECK:  Supple, no jugular venous distention. No thyroid enlargement, no tenderness.  LUNGS: Normal breath sounds bilaterally, no wheezing, rales,rhonchi or crepitation. No use of accessory muscles of respiration.  CARDIOVASCULAR: S1, S2 normal. No murmurs, rubs, or gallops.  ABDOMEN: Soft, non-tender, non-distended. Bowel sounds present. No organomegaly or mass.  EXTREMITIES: No pedal edema, cyanosis, or clubbing.  NEUROLOGIC: Cranial nerves II through XII are intact. Muscle strength 5/5 in all extremities. Sensation intact. Gait not checked.  PSYCHIATRIC: The patient is alert and oriented x 3.  SKIN: No obvious rash, lesion, or ulcer.  DATA REVIEW:   CBC Recent Labs  Lab 09/13/18 1835  09/16/18 0411  WBC 5.7  --   --  HGB 7.7*   < > 8.3*  HCT 26.7*  --   --   PLT 227  --   --    < > = values in this interval not displayed.    Chemistries  Recent Labs  Lab 09/13/18 1835 09/15/18 0433  NA 143 140  K 4.0 3.8  CL 112* 111  CO2 27 25  GLUCOSE 111* 94  BUN 32* 29*  CREATININE 1.17 1.23  CALCIUM 8.2* 8.0*  MG  --  2.2  AST 22  --   ALT 14  --   ALKPHOS 90  --   BILITOT 0.6  --      Microbiology Results  Results for orders placed or performed during the hospital encounter of 09/13/18  SARS Coronavirus 2 (CEPHEID- Performed in [place] hospital lab), Hosp Order     Status: None   Collection Time: 09/13/18 10:24 PM   Specimen: Nasopharyngeal Swab  Result Value Ref Range Status   SARS Coronavirus 2 NEGATIVE NEGATIVE  Final    Comment: (NOTE) If result is NEGATIVE SARS-CoV-2 target nucleic acids are NOT DETECTED. The SARS-CoV-2 RNA is generally detectable in upper and lower  respiratory specimens during the acute phase of infection. The lowest  concentration of SARS-CoV-2 viral copies this assay can detect is 250  copies / mL. A negative result does not preclude SARS-CoV-2 infection  and should not be used as the sole basis for treatment or other  patient management decisions.  A negative result may occur with  improper specimen collection / handling, submission of specimen other  than nasopharyngeal swab, presence of viral mutation(s) within the  areas targeted by this assay, and inadequate number of viral copies  (<250 copies / mL). A negative result must be combined with clinical  observations, patient history, and epidemiological information. If result is POSITIVE SARS-CoV-2 target nucleic acids are DETECTED. The SARS-CoV-2 RNA is generally detectable in upper and lower  respiratory specimens dur ing the acute phase of infection.  Positive  results are indicative of active infection with SARS-CoV-2.  Clinical  correlation with patient history and other diagnostic information is  necessary to determine patient infection status.  Positive results do  not rule out bacterial infection or co-infection with other viruses. If result is PRESUMPTIVE POSTIVE SARS-CoV-2 nucleic acids MAY BE PRESENT.   A presumptive positive result was obtained on the submitted specimen  and confirmed on repeat testing.  While 2019 novel coronavirus  (SARS-CoV-2) nucleic acids may be present in the submitted sample  additional confirmatory testing may be necessary for epidemiological  and / or clinical management purposes  to differentiate between  SARS-CoV-2 and other Sarbecovirus currently known to infect humans.  If clinically indicated additional testing with an alternate test  methodology [phone]) is advised. The  SARS-CoV-2 RNA is generally  detectable in upper and lower respiratory sp ecimens during the acute  phase of infection. The expected result is Negative. Fact Sheet for Patients:  StrictlyIdeas.no Fact Sheet for Healthcare Providers: BankingDealers.co.za This test is not yet approved or cleared by the [place] FDA and has been authorized for detection and/or diagnosis of SARS-CoV-2 by FDA under an Emergency Use Authorization (EUA).  This EUA will remain in effect (meaning this test can be used) for the duration of the COVID-19 declaration under Section 564(b)(1) of the Act, 21 U.S.C. section 360bbb-3(b)(1), unless the authorization is terminated or revoked sooner. Performed at [facility], [address] [place], [place] [address]  RADIOLOGY:  No results found.   Management plans discussed with the patient, family and they are in agreement.  CODE STATUS: Full Code   TOTAL TIME TAKING CARE OF THIS PATIENT: 32 minutes.    Demetrios Loll M.D on 09/16/2018 at 11:44 AM  Between 7am to 6pm - Pager - [phone]  After 6pm go to [email or web address] - Proofreader  [facility]  Office  [phone]  CC: Primary care physician; [name], MD   Note: This dictation was prepared with Dragon dictation along with smaller phrase technology. Any transcriptional errors that result from this process are unintentional.

## 2018-09-20 LAB — SURGICAL PATHOLOGY

## 2018-10-06 DIAGNOSIS — Z950 Presence of cardiac pacemaker: Secondary | ICD-10-CM | POA: Diagnosis not present

## 2018-10-06 DIAGNOSIS — I480 Paroxysmal atrial fibrillation: Secondary | ICD-10-CM | POA: Diagnosis not present

## 2018-10-06 DIAGNOSIS — Q2730 Arteriovenous malformation, site unspecified: Secondary | ICD-10-CM | POA: Diagnosis not present

## 2018-10-06 DIAGNOSIS — K31811 Angiodysplasia of stomach and duodenum with bleeding: Secondary | ICD-10-CM | POA: Diagnosis not present

## 2018-10-06 DIAGNOSIS — Z8719 Personal history of other diseases of the digestive system: Secondary | ICD-10-CM | POA: Diagnosis not present

## 2018-10-06 DIAGNOSIS — K2990 Gastroduodenitis, unspecified, without bleeding: Secondary | ICD-10-CM | POA: Diagnosis not present

## 2018-10-10 DIAGNOSIS — L989 Disorder of the skin and subcutaneous tissue, unspecified: Secondary | ICD-10-CM | POA: Diagnosis not present

## 2018-10-10 DIAGNOSIS — D649 Anemia, unspecified: Secondary | ICD-10-CM | POA: Diagnosis not present

## 2018-10-10 DIAGNOSIS — Z8719 Personal history of other diseases of the digestive system: Secondary | ICD-10-CM | POA: Diagnosis not present

## 2018-10-11 ENCOUNTER — Emergency Department
Admission: EM | Admit: 2018-10-11 | Discharge: 2018-10-11 | Disposition: A | Discharge status: Home / Self Care | Payer: PPO | Attending: Emergency Medicine | Admitting: Emergency Medicine

## 2018-10-11 ENCOUNTER — Emergency Department: Payer: PPO

## 2018-10-11 ENCOUNTER — Encounter: Payer: Self-pay | Admitting: Emergency Medicine

## 2018-10-11 ENCOUNTER — Other Ambulatory Visit: Payer: Self-pay

## 2018-10-11 DIAGNOSIS — R0602 Shortness of breath: Secondary | ICD-10-CM

## 2018-10-11 DIAGNOSIS — I130 Hypertensive heart and chronic kidney disease with heart failure and stage 1 through stage 4 chronic kidney disease, or unspecified chronic kidney disease: Secondary | ICD-10-CM | POA: Diagnosis not present

## 2018-10-11 DIAGNOSIS — R079 Chest pain, unspecified: Secondary | ICD-10-CM | POA: Insufficient documentation

## 2018-10-11 DIAGNOSIS — R0789 Other chest pain: Secondary | ICD-10-CM | POA: Diagnosis not present

## 2018-10-11 DIAGNOSIS — Z8546 Personal history of malignant neoplasm of prostate: Secondary | ICD-10-CM | POA: Diagnosis not present

## 2018-10-11 DIAGNOSIS — I5042 Chronic combined systolic (congestive) and diastolic (congestive) heart failure: Secondary | ICD-10-CM | POA: Diagnosis not present

## 2018-10-11 DIAGNOSIS — I2510 Atherosclerotic heart disease of native coronary artery without angina pectoris: Secondary | ICD-10-CM | POA: Insufficient documentation

## 2018-10-11 DIAGNOSIS — Z87891 Personal history of nicotine dependence: Secondary | ICD-10-CM | POA: Diagnosis not present

## 2018-10-11 DIAGNOSIS — Z79899 Other long term (current) drug therapy: Secondary | ICD-10-CM | POA: Diagnosis not present

## 2018-10-11 DIAGNOSIS — Z950 Presence of cardiac pacemaker: Secondary | ICD-10-CM | POA: Diagnosis not present

## 2018-10-11 DIAGNOSIS — Z8673 Personal history of transient ischemic attack (TIA), and cerebral infarction without residual deficits: Secondary | ICD-10-CM | POA: Diagnosis not present

## 2018-10-11 DIAGNOSIS — Z951 Presence of aortocoronary bypass graft: Secondary | ICD-10-CM | POA: Diagnosis not present

## 2018-10-11 DIAGNOSIS — N183 Chronic kidney disease, stage 3 (moderate): Secondary | ICD-10-CM | POA: Diagnosis not present

## 2018-10-11 LAB — BASIC METABOLIC PANEL
Anion gap: 3 — ABNORMAL LOW (ref 5–15)
BUN: 35 mg/dL — ABNORMAL HIGH (ref 8–23)
CO2: 26 mmol/L (ref 22–32)
Calcium: 8.3 mg/dL — ABNORMAL LOW (ref 8.9–10.3)
Chloride: 113 mmol/L — ABNORMAL HIGH (ref 98–111)
Creatinine, Ser: 1.2 mg/dL (ref 0.61–1.24)
GFR calc Af Amer: >60 mL/min (ref >60)
GFR calc non Af Amer: 56 mL/min — ABNORMAL LOW (ref >60)
Glucose, Bld: 110 mg/dL — ABNORMAL HIGH (ref 70–99)
Potassium: 4.3 mmol/L (ref 3.5–5.1)
Sodium: 142 mmol/L (ref 135–145)

## 2018-10-11 LAB — CBC
HCT: 28.2 % — ABNORMAL LOW (ref 39.0–52.0)
Hemoglobin: 8.2 g/dL — ABNORMAL LOW (ref 13.0–17.0)
MCH: 23.9 pg — ABNORMAL LOW (ref 26.0–34.0)
MCHC: 29.1 g/dL — ABNORMAL LOW (ref 30.0–36.0)
MCV: 82.2 fL (ref 80.0–100.0)
Platelets: 233 10*3/uL (ref 150–400)
RBC: 3.43 MIL/uL — ABNORMAL LOW (ref 4.22–5.81)
RDW: 20.8 % — ABNORMAL HIGH (ref 11.5–15.5)
WBC: 6.1 10*3/uL (ref 4.0–10.5)
nRBC: 0 % (ref 0.0–0.2)

## 2018-10-11 LAB — TROPONIN I (HIGH SENSITIVITY)
Troponin I (High Sensitivity): 26 ng/L — ABNORMAL HIGH (ref <18)
Troponin I (High Sensitivity): 27 ng/L — ABNORMAL HIGH (ref <18)

## 2018-10-11 MED ORDER — SODIUM CHLORIDE 0.9% FLUSH
3.0000 mL | Freq: Once | INTRAVENOUS | Status: AC
  Administered 2018-10-11: 10:00:00 3 mL via INTRAVENOUS

## 2018-10-11 NOTE — ED Provider Notes (Signed)
[facility] Emergency Department Provider Note  Time seen: 10:00 AM  I have reviewed the triage vital signs and the nursing notes.   HISTORY  Chief Complaint Shortness of Breath   HPI Thomas [name] is a 84 y.o. male with a past medical history of CHF, CKD, hypertension, PAF, pacemaker/AICD, presents to the emergency department for shortness of breath and chest pain.  According to the patient with sign language interpreter, he developed sudden onset of shortness of breath this morning around 4:00 where he felt like he could not catch his breath and was having some mild chest pressure/tightness as well.  His symptoms have since completely resolved.  Denies any shortness of breath or chest pain at this time.  However he was concerned so he came to the emergency department for evaluation.  Patient denies any cough fever or congestion.  Overall appears well, no acute distress.   Past Medical History:  Diagnosis Date  . AICD (automatic cardioverter/defibrillator) present   . Biventricular ICD (implantable cardioverter-defibrillator) in place 03/24/2005   Implantation of a Medtronic Adapta ADDRO1, serial number [id number]  . CHF (congestive heart failure) ([place])   . CKD (chronic kidney disease), stage III ([place])   . Coronary artery disease    a. s/p CABG 1986. b. Multiple PCIs/caths. c. 09/2013: s/p PTCA and BMS to SVG-OM.  [name]   . Dysrhythmia   . History of abdominal aortic aneurysm   . History of bleeding peptic ulcer 1980  . History of epididymitis 2013  . HTN (hypertension)   . Hydronephrosis with ureteropelvic junction obstruction   . Hydroureter on left 2009  . Hypertension   . Ischemic cardiomyopathy    a. Prior EF 30-35%, s/p BIV-ICD. b. 09/2013: EF 45-50%.  . Moderate tricuspid regurgitation   . PAF (paroxysmal atrial fibrillation) ([place])   . Presence of permanent cardiac pacemaker   . Prostate cancer ([place])   . Status post coronary artery bypass grafting  1986   LIMA to the LAD, SVG to OM, SVG to RCA  . Testicular swelling     Patient Active Problem List   Diagnosis Date Noted  . GIB (gastrointestinal bleeding) 09/14/2018  . Hypotension 08/14/2018  . Acute on chronic systolic CHF (congestive heart failure) ([place]) 08/12/2018  . Acute on chronic combined systolic and diastolic CHF (congestive heart failure) ([place])   . GI bleed 05/26/2018  . Occult GI bleeding 05/25/2018  . Normocytic anemia 05/25/2018  . Elevated troponin 05/25/2018  . Anemia   . Lymphedema 02/28/2018  . Weakness 07/15/2016  . Fatigue 07/15/2016  . CVA (cerebral infarction) 10/31/2015  . Bulbous urethral stricture 09/18/2015  . Bilateral deafness 08/19/2015  . Bilateral cataracts 08/19/2015  . Acid reflux 08/19/2015  . HLD (hyperlipidemia) 08/19/2015  . BP (high blood pressure) 08/19/2015  . Myocardial infarction ([place]) 08/19/2015  . Calculus of kidney 08/19/2015  . Artificial cardiac pacemaker 08/19/2015  . Gastroduodenal ulcer 08/19/2015  . Dupuytren's contracture of foot 08/19/2015  . Malignant neoplasm of prostate ([place]) 08/19/2015  . Microhematuria 08/19/2015  . Coronary artery disease 02/22/2015  . Status post coronary artery bypass grafting 02/22/2015  . Benign essential HTN 04/02/2014  . Hematochezia 02/20/2014  . Moderate tricuspid regurgitation 02/20/2014  . Mobitz type II atrioventricular block 12/12/2013  . Long term current use of anticoagulant 10/18/2013  . Cardiomyopathy, ischemic: Ejection fraction 45% 11/11/2012  . CAD Status post coronary artery bypass grafting: 1995 11/11/2012  . Permanent atrial fibrillation ([place]) 11/11/2012  .  Biventricular ICD Medtronic Viva single chamber October 2014 11/11/2012  . Chronic systolic heart failure ([place]) 11/11/2012    Past Surgical History:  Procedure Laterality Date  . 2-D echocardiogram  11/20/2011   Ejection fraction 30-35% moderate concentric left ventricular hypertrophy. Left atrium is moderately  dilated. Mild MR. Mild or  . BI-VENTRICULAR IMPLANTABLE CARDIOVERTER DEFIBRILLATOR N/A 12/16/2012   Procedure: BI-VENTRICULAR IMPLANTABLE CARDIOVERTER DEFIBRILLATOR  (CRT-D);  Surgeon: [name], MD;  Location: [facility] CATH LAB;  Service: Cardiovascular;  Laterality: N/A;  . CARDIAC CATHETERIZATION  12/10/2011   SVG to OM widely patent.  LIMA to LAD patent  . CATARACT EXTRACTION W/PHACO Right 10/12/2017   Procedure: CATARACT EXTRACTION PHACO AND INTRAOCULAR LENS PLACEMENT (IOC);  Surgeon: Birder Robson, MD;  Location: [facility] ORS;  Service: Ophthalmology;  Laterality: Right;  [place] 00:57 AP% 15.9 CDE 9.07 Fluid pack lot # [id number]  . COLONOSCOPY N/A 07/13/2018   Procedure: COLONOSCOPY;  Surgeon: Toledo, Benay Pike, MD;  Location: [facility] ENDOSCOPY;  Service: Gastroenterology;  Laterality: N/A;  . CORONARY ARTERY BYPASS GRAFT  1986  . ENTEROSCOPY N/A 09/14/2018   Procedure: ENTEROSCOPY;  Surgeon: Toledo, Benay Pike, MD;  Location: [facility] ENDOSCOPY;  Service: Gastroenterology;  Laterality: N/A;  symptomatic anemia, GI blood loss anemia, melena, positive small bowel capsule endoscopy showing source of bleeding   . ESOPHAGOGASTRODUODENOSCOPY N/A 07/13/2018   Procedure: ESOPHAGOGASTRODUODENOSCOPY (EGD);  Surgeon: Toledo, Benay Pike, MD;  Location: [facility] ENDOSCOPY;  Service: Gastroenterology;  Laterality: N/A;  . ESOPHAGOGASTRODUODENOSCOPY N/A 09/14/2018   Procedure: ESOPHAGOGASTRODUODENOSCOPY (EGD);  Surgeon: Toledo, Benay Pike, MD;  Location: [facility] ENDOSCOPY;  Service: Gastroenterology;  Laterality: N/A;  SIGN LANAGUAGE INTERPRETER  . ESOPHAGOGASTRODUODENOSCOPY (EGD) WITH PROPOFOL N/A 05/27/2018   Procedure: ESOPHAGOGASTRODUODENOSCOPY (EGD) WITH PROPOFOL;  Surgeon: Clarene Essex, MD;  Location: [place];  Service: Endoscopy;  Laterality: N/A;  . INSERT / REPLACE / REMOVE PACEMAKER    . LEFT HEART CATHETERIZATION WITH CORONARY/GRAFT ANGIOGRAM N/A 12/10/2011   Procedure: LEFT HEART CATHETERIZATION WITH Beatrix Fetters;  Surgeon: Sanda Klein, MD;  Location: [place] CATH LAB;  Service: Cardiovascular;  Laterality: N/A;  . LEFT HEART CATHETERIZATION WITH CORONARY/GRAFT ANGIOGRAM N/A 09/25/2013   Procedure: LEFT HEART CATHETERIZATION WITH Beatrix Fetters;  Surgeon: Blane Ohara, MD;  Location: [facility] CATH LAB;  Service: Cardiovascular;  Laterality: N/A;  . Persantine Myoview  05/06/2010   Post-rest ejection fraction 30%. No significant ischemia demonstrated. Compared to previous study there is no significant change.  . TRANSURETHRAL RESECTION OF PROSTATE     s/p    Prior to Admission medications   Medication Sig Start Date End Date Taking? Authorizing Provider  albuterol (VENTOLIN HFA) 108 (90 Base) MCG/ACT inhaler Inhale 2 puffs into the lungs 4 (four) times daily as needed. Patient taking differently: Inhale 2 puffs into the lungs 4 (four) times daily as needed for wheezing or shortness of breath.  08/02/18   Croitoru, Mihai, MD  carvedilol (COREG) 3.125 MG tablet Take 1 tablet (3.125 mg total) by mouth 2 (two) times daily with a meal. 08/17/18   Lavina Hamman, MD  ferrous sulfate 325 (65 FE) MG tablet Take 1 tablet (325 mg total) by mouth daily for 30 days. 05/28/18   Elodia Florence., MD  furosemide (LASIX) 40 MG tablet Take 1 tablet (40 mg total) by mouth daily. 08/24/18   Croitoru, Mihai, MD  HYDROCORTISONE, TOPICAL, 2 % LOTN Apply 40 oz topically 2 (two) times daily as needed (as needed for itching on foot). APPLY BID TO AFFECTED  AREA 08/23/18   Croitoru, Mihai, MD  isosorbide mononitrate (IMDUR) 30 MG 24 hr tablet Take 1 tablet (30 mg total) by mouth daily. 08/18/18   Lavina Hamman, MD  losartan (COZAAR) 25 MG tablet Take 1 tablet (25 mg total) by mouth daily. 08/18/18   Lavina Hamman, MD  pantoprazole (PROTONIX) 40 MG tablet Take 1 tablet (40 mg total) by mouth 2 (two) times daily before a meal. 09/16/18   Demetrios Loll, MD  simvastatin (ZOCOR) 20 MG tablet Take 1 tablet (20 mg total) by mouth  daily. 06/14/18   Lendon Colonel, NP  vitamin B-12 (CYANOCOBALAMIN) 500 MCG tablet Take 1 tablet (500 mcg total) by mouth daily. 08/18/18   Maisie Fus, MD    Allergies  Allergen Reactions  . Phenazopyridine Nausea Only and Other (See Comments)    GI UPSET  . Ramipril Other (See Comments)    unk Other reaction(s): Other (See Comments), Unknown unk    Family History  Problem Relation Age of Onset  . Hypertension Father     Social History Social History   Tobacco Use  . Smoking status: Former Smoker    Quit date: 03/15/1985    Years since quitting: 33.5  . Smokeless tobacco: Never Used  Substance Use Topics  . Alcohol use: No    Comment: occas.  . Drug use: No    Review of Systems Constitutional: Negative for fever. ENT: Negative for recent illness/congestion Cardiovascular: Positive for mild chest pain which has since resolved. Respiratory: Negative for cough.  Positive for shortness of breath which is since resolved. Gastrointestinal: Negative for abdominal pain All other ROS negative  ____________________________________________   PHYSICAL EXAM:  VITAL SIGNS: ED Triage Vitals  Enc Vitals Group     BP 10/11/18 0908 122/60     Pulse Rate 10/11/18 0907 67     Resp 10/11/18 0907 18     Temp 10/11/18 0907 98.2 F (36.8 C)     Temp Source 10/11/18 0907 Oral     SpO2 10/11/18 0908 96 %     Weight 10/11/18 0908 232 lb (105.2 kg)     Height 10/11/18 0908 6\' 2"  (1.88 m)     Head Circumference --      Peak Flow --      Pain Score 10/11/18 0905 6     Pain Loc --      Pain Edu? --      Excl. in [place]? --    Constitutional: Alert and oriented. Well appearing and in no distress. Eyes: Normal exam ENT      Head: Normocephalic and atraumatic.      Mouth/Throat: Mucous membranes are moist. Cardiovascular: Normal rate, regular rhythm.  Respiratory: Normal respiratory effort without tachypnea nor retractions. Breath sounds are clear Gastrointestinal: Soft and  nontender. No distention. Musculoskeletal: Nontender with normal range of motion in all extremities.  Neurologic:  Normal speech and language. No gross focal neurologic deficits Skin:  Skin is warm, dry and intact.  Psychiatric: Mood and affect are normal.  ____________________________________________    EKG  EKG viewed and interpreted by myself shows a ventricular paced rhythm at 62 bpm with a widened QRS, nonspecific ST changes.  ____________________________________________    RADIOLOGY  Chest x-ray shows mild CHF with small bibasilar effusions.  ____________________________________________   INITIAL IMPRESSION / ASSESSMENT AND PLAN / ED COURSE  Pertinent labs & imaging results that were available during my care of the patient were reviewed by me and  considered in my medical decision making (see chart for details).   Patient presents to the emergency department for chest pain shortness of breath beginning around 4:00 this morning.  Patient states his symptoms have since resolved.  Overall the patient appears well.  Differential would include angina, ACS, pneumonia, pneumothorax.  Patient's labs show anemia however largely unchanged from baseline.  Troponin is currently 26.  We will repeat a troponin in 2 hours.  Currently the patient appears very well, has no complaints and his symptoms have resolved.  Repeat troponin is unchanged.  Patient continues to appear well with no chest pain complaints or shortness of breath.  We will discharge patient home with PCP follow-up.  XADEN KAUFMAN was evaluated in Emergency Department on 10/11/2018 for the symptoms described in the history of present illness. He was evaluated in the context of the global COVID-19 pandemic, which necessitated consideration that the patient might be at risk for infection with the SARS-CoV-2 virus that causes COVID-19. Institutional protocols and algorithms that pertain to the evaluation of patients at risk for  COVID-19 are in a state of rapid change based on information released by regulatory bodies including the CDC and federal and state organizations. These policies and algorithms were followed during the patient's care in the ED.  ____________________________________________   FINAL CLINICAL IMPRESSION(S) / ED DIAGNOSES  Dyspnea Chest pain   Harvest Dark, MD 10/11/18 1219

## 2018-10-11 NOTE — ED Triage Notes (Signed)
Sudden onset short of breath about 4am.  Was okay yuesterday.

## 2018-10-14 NOTE — Progress Notes (Deleted)
Patient ID: Thomas [name], male       DOB: 11-29-2034, 84 y.o.      MRN [id number]  Entire visit was done in the presence of the sign language interpreter  HPI  Thomas [name] is a 84 y/o male with a history of CAD,HTN, CKD, PAF, peptic ulcer, bilateral deafness, previous tobacco use and chronic heart failure.   Echo report from 05/02/2018 reviewed and showed an EF of 35-40% along with mild/ moderate Thomas, mild AR and moderate pulmonary HTN.Echo report from 11/25/15 reviewed and showed an EF of 45-50% along with mild AR, moderate Thomas and mild-moderate TR.   Was in the ED 10/11/2018 due to shortness of breath/ episode of chest pain. Symptoms had resolved prior to arriving at the ED. Evaluated and released. Admitted 09/13/2018 due to GIB. Initially hypotensive. GI consult obtained. EGD done and cautery of angioectasias was done. 2 units of blood given. Discharged after 3 days.  Admitted 08/11/2018 due to weakness, edema and recent falls. Cardiology consult obtained. Initially needed IV lasix and then developed hypotension. Lost ~ 7L during admission. Discharged after 6 days. Admitted 05/25/2018 due to anemia and HF. GI consulted. Had EGD done and had iron infusion. Discharged after 3 days.   He presents today for a follow-up visit with a chief complaint of   Past Medical History:  Diagnosis Date  . AICD (automatic cardioverter/defibrillator) present   . Biventricular ICD (implantable cardioverter-defibrillator) in place 03/24/2005   Implantation of a Medtronic Adapta ADDRO1, serial number [id number]  . CHF (congestive heart failure) ([place])   . CKD (chronic kidney disease), stage III ([place])   . Coronary artery disease    a. s/p CABG 1986. b. Multiple PCIs/caths. c. 09/2013: s/p PTCA and BMS to SVG-OM.  [name]   . Dysrhythmia   . History of abdominal aortic aneurysm   . History of bleeding peptic ulcer 1980  . History of epididymitis 2013  . HTN (hypertension)   . Hydronephrosis with ureteropelvic junction  obstruction   . Hydroureter on left 2009  . Hypertension   . Ischemic cardiomyopathy    a. Prior EF 30-35%, s/p BIV-ICD. b. 09/2013: EF 45-50%.  . Moderate tricuspid regurgitation   . PAF (paroxysmal atrial fibrillation) ([place])   . Presence of permanent cardiac pacemaker   . Prostate cancer ([place])   . Status post coronary artery bypass grafting 1986   LIMA to the LAD, SVG to OM, SVG to RCA  . Testicular swelling    Past Surgical History:  Procedure Laterality Date  . 2-D echocardiogram  11/20/2011   Ejection fraction 30-35% moderate concentric left ventricular hypertrophy. Left atrium is moderately dilated. Mild Thomas. Mild or  . BI-VENTRICULAR IMPLANTABLE CARDIOVERTER DEFIBRILLATOR N/A 12/16/2012   Procedure: BI-VENTRICULAR IMPLANTABLE CARDIOVERTER DEFIBRILLATOR  (CRT-D);  Surgeon: Thomas Lance, [name];  Location: [facility] CATH LAB;  Service: Cardiovascular;  Laterality: N/A;  . CARDIAC CATHETERIZATION  12/10/2011   SVG to OM widely patent.  LIMA to LAD patent  . CATARACT EXTRACTION W/PHACO Right 10/12/2017   Procedure: CATARACT EXTRACTION PHACO AND INTRAOCULAR LENS PLACEMENT (IOC);  Surgeon: Thomas Robson, [name];  Location: [facility] ORS;  Service: Ophthalmology;  Laterality: Right;  [place] 00:57 AP% 15.9 CDE 9.07 Fluid pack lot # [id number]  . COLONOSCOPY N/A 07/13/2018   Procedure: COLONOSCOPY;  Surgeon: Thomas [name];  Location: [facility] ENDOSCOPY;  Service: Gastroenterology;  Laterality: N/A;  . CORONARY ARTERY BYPASS GRAFT  1986  . ENTEROSCOPY N/A 09/14/2018   Procedure: ENTEROSCOPY;  Surgeon: Thomas [name];  Location: [facility] ENDOSCOPY;  Service: Gastroenterology;  Laterality: N/A;  symptomatic anemia, GI blood loss anemia, melena, positive small bowel capsule endoscopy showing source of bleeding   . ESOPHAGOGASTRODUODENOSCOPY N/A 07/13/2018   Procedure: ESOPHAGOGASTRODUODENOSCOPY (EGD);  Surgeon: Thomas [name];  Location: [facility] ENDOSCOPY;  Service: Gastroenterology;  Laterality: N/A;  .  ESOPHAGOGASTRODUODENOSCOPY N/A 09/14/2018   Procedure: ESOPHAGOGASTRODUODENOSCOPY (EGD);  Surgeon: Thomas [name];  Location: [facility] ENDOSCOPY;  Service: Gastroenterology;  Laterality: N/A;  SIGN LANAGUAGE INTERPRETER  . ESOPHAGOGASTRODUODENOSCOPY (EGD) WITH PROPOFOL N/A 05/27/2018   Procedure: ESOPHAGOGASTRODUODENOSCOPY (EGD) WITH PROPOFOL;  Surgeon: Thomas Essex, [name];  Location: [place];  Service: Endoscopy;  Laterality: N/A;  . INSERT / REPLACE / REMOVE PACEMAKER    . LEFT HEART CATHETERIZATION WITH CORONARY/GRAFT ANGIOGRAM N/A 12/10/2011   Procedure: LEFT HEART CATHETERIZATION WITH Thomas [name];  Surgeon: Thomas Klein, [name];  Location: [place] CATH LAB;  Service: Cardiovascular;  Laterality: N/A;  . LEFT HEART CATHETERIZATION WITH CORONARY/GRAFT ANGIOGRAM N/A 09/25/2013   Procedure: LEFT HEART CATHETERIZATION WITH Thomas [name];  Surgeon: Thomas Ohara, [name];  Location: [facility] CATH LAB;  Service: Cardiovascular;  Laterality: N/A;  . Persantine Myoview  05/06/2010   Post-rest ejection fraction 30%. No significant ischemia demonstrated. Compared to previous study there is no significant change.  . TRANSURETHRAL RESECTION OF PROSTATE     s/p   Family History  Problem Relation Age of Onset  . Hypertension Father    Social History   Tobacco Use  . Smoking status: Former Smoker    Quit date: 03/15/1985    Years since quitting: 33.6  . Smokeless tobacco: Never Used  Substance Use Topics  . Alcohol use: No    Comment: occas.   Allergies  Allergen Reactions  . Phenazopyridine Nausea Only and Other (See Comments)    GI UPSET  . Ramipril Other (See Comments)    unk Other reaction(s): Other (See Comments), Unknown unk      Review of Systems  Constitutional: Positive for fatigue. Negative for appetite change.  HENT: Positive for hearing loss. Negative for congestion, rhinorrhea and sore throat.   Eyes: Negative.   Respiratory: Negative for cough, chest tightness  and shortness of breath.   Cardiovascular: Positive for leg swelling. Negative for chest pain and palpitations.  Gastrointestinal: Negative for abdominal distention and abdominal pain.  Endocrine: Negative.   Genitourinary: Negative.   Musculoskeletal: Positive for arthralgias (left shoulder). Negative for back pain.  Skin: Negative.   Allergic/Immunologic: Negative.   Neurological: Negative for dizziness and light-headedness.  Hematological: Negative for adenopathy. Bruises/bleeds easily.  Psychiatric/Behavioral: Negative for dysphoric mood and sleep disturbance (sleeping on 2 pillows). The patient is not nervous/anxious.      Physical Exam Vitals signs and nursing note reviewed.  Constitutional:      Appearance: He is well-developed.  HENT:     Head: Normocephalic and atraumatic.     Ears:     Comments: Completely deaf in both ears Neck:     Musculoskeletal: Normal range of motion and neck supple.  Cardiovascular:     Rate and Rhythm: Normal rate and regular rhythm.  Pulmonary:     Effort: Pulmonary effort is normal.     Breath sounds: Normal breath sounds. No rhonchi or rales.  Abdominal:     Palpations: Abdomen is soft.     Tenderness: There is no abdominal tenderness.  Musculoskeletal:     Right lower leg: He exhibits no tenderness. Edema (1+ pitting)  present.     Left lower leg: He exhibits no tenderness. Edema (1+ pitting) present.  Skin:    General: Skin is warm and dry.  Neurological:     Mental Status: He is alert and oriented to person, place, and time.     Motor: No weakness.  Psychiatric:        Mood and Affect: Mood normal.        Behavior: Behavior normal.    Assessment & Plan:  1: Chronic heart failure with reduced ejection fraction- - NYHA class II - euvolemic  - not weighing daily and he was instructed to resume weighing daily and write the weight down so that he can call for an overnight weight gain of >2 pounds or a weekly weight gain of >5  pounds - weight 231.6 pounds from last visit here 2 months ago - not adding salt and has been reading food labels. Reviewed the importance of closely following a 2000mg  sodium diet  - drinks 1 cup of coffee and ~ 48 ounces of water daily - once COVID restrictions are lifted, consider changing his losartan to entresto - remains active and still works Tues-Friday - saw cardiology Purcell Nails) 09/05/2018 - has ICD and saw EP [name]) 07/20/2018 - BNP 08/11/2018 was 316.9  2: HTN- - BP - saw PCP Kary Kos) 10/10/2018 - BMP from 10/11/2018 reviewed and showed sodium 142, potassium 4.3, creatinine 1.2 and GFR 56  3: Lymphedema- - stage 2 - has been elevating his legs more often during the day when he's off work and then when he gets home in the afternoons - patient wearing compression socks but edema persists - limited in his ability to exercise due to his fatigue - will make referral for lymphapress compression boots  Patient did not bring his medications nor a list. Each medication was verbally reviewed with the patient and he was encouraged to bring the bottles to every visit to confirm accuracy of list.    Entire visit was done in the presence of the sign language interpreter

## 2018-10-17 ENCOUNTER — Ambulatory Visit: Payer: PPO | Admitting: Family

## 2018-10-17 DIAGNOSIS — R0602 Shortness of breath: Secondary | ICD-10-CM | POA: Diagnosis not present

## 2018-10-17 DIAGNOSIS — D649 Anemia, unspecified: Secondary | ICD-10-CM | POA: Diagnosis not present

## 2018-10-17 DIAGNOSIS — J209 Acute bronchitis, unspecified: Secondary | ICD-10-CM | POA: Diagnosis not present

## 2018-10-17 DIAGNOSIS — R079 Chest pain, unspecified: Secondary | ICD-10-CM | POA: Diagnosis not present

## 2018-10-27 ENCOUNTER — Other Ambulatory Visit: Payer: Self-pay

## 2018-10-27 ENCOUNTER — Encounter: Payer: Self-pay | Admitting: Cardiovascular Disease

## 2018-10-27 ENCOUNTER — Ambulatory Visit (INDEPENDENT_AMBULATORY_CARE_PROVIDER_SITE_OTHER): Payer: PPO | Admitting: Cardiovascular Disease

## 2018-10-27 VITALS — BP 120/73 | HR 91 | Ht 74.0 in | Wt 240.6 lb

## 2018-10-27 DIAGNOSIS — I361 Nonrheumatic tricuspid (valve) insufficiency: Secondary | ICD-10-CM | POA: Diagnosis not present

## 2018-10-27 DIAGNOSIS — I441 Atrioventricular block, second degree: Secondary | ICD-10-CM

## 2018-10-27 DIAGNOSIS — I5043 Acute on chronic combined systolic (congestive) and diastolic (congestive) heart failure: Secondary | ICD-10-CM

## 2018-10-27 DIAGNOSIS — D485 Neoplasm of uncertain behavior of skin: Secondary | ICD-10-CM | POA: Diagnosis not present

## 2018-10-27 DIAGNOSIS — I25708 Atherosclerosis of coronary artery bypass graft(s), unspecified, with other forms of angina pectoris: Secondary | ICD-10-CM

## 2018-10-27 DIAGNOSIS — L578 Other skin changes due to chronic exposure to nonionizing radiation: Secondary | ICD-10-CM | POA: Diagnosis not present

## 2018-10-27 DIAGNOSIS — D500 Iron deficiency anemia secondary to blood loss (chronic): Secondary | ICD-10-CM | POA: Diagnosis not present

## 2018-10-27 DIAGNOSIS — L219 Seborrheic dermatitis, unspecified: Secondary | ICD-10-CM | POA: Diagnosis not present

## 2018-10-27 DIAGNOSIS — L821 Other seborrheic keratosis: Secondary | ICD-10-CM | POA: Diagnosis not present

## 2018-10-27 DIAGNOSIS — L738 Other specified follicular disorders: Secondary | ICD-10-CM | POA: Diagnosis not present

## 2018-10-27 DIAGNOSIS — Z7901 Long term (current) use of anticoagulants: Secondary | ICD-10-CM | POA: Diagnosis not present

## 2018-10-27 DIAGNOSIS — C44319 Basal cell carcinoma of skin of other parts of face: Secondary | ICD-10-CM | POA: Diagnosis not present

## 2018-10-27 DIAGNOSIS — E7800 Pure hypercholesterolemia, unspecified: Secondary | ICD-10-CM | POA: Diagnosis not present

## 2018-10-27 DIAGNOSIS — D692 Other nonthrombocytopenic purpura: Secondary | ICD-10-CM | POA: Diagnosis not present

## 2018-10-27 DIAGNOSIS — I10 Essential (primary) hypertension: Secondary | ICD-10-CM

## 2018-10-27 DIAGNOSIS — I4821 Permanent atrial fibrillation: Secondary | ICD-10-CM

## 2018-10-27 DIAGNOSIS — Z95810 Presence of automatic (implantable) cardiac defibrillator: Secondary | ICD-10-CM

## 2018-10-27 DIAGNOSIS — D2239 Melanocytic nevi of other parts of face: Secondary | ICD-10-CM | POA: Diagnosis not present

## 2018-10-27 MED ORDER — FOLIC ACID 1 MG PO TABS: 1.0000 mg | ORAL_TABLET | Freq: Every day | ORAL | Status: DC

## 2018-10-27 MED ORDER — POTASSIUM CHLORIDE CRYS ER 20 MEQ PO TBCR: 20.0000 meq | EXTENDED_RELEASE_TABLET | Freq: Every day | ORAL | 3 refills | Status: DC

## 2018-10-27 MED ORDER — FUROSEMIDE 40 MG PO TABS: 80.0000 mg | ORAL_TABLET | Freq: Every day | ORAL | 1 refills | Status: DC

## 2018-10-27 NOTE — Progress Notes (Signed)
Cardiology Office Note    Date:  11/02/2018   ID:  Thomas [name], DOB 18-Apr-1934, MRN [id number]  PCP:  [name], MD  Cardiologist:  Cristopher Peru, MD (EP); Sanda Klein, MD   Chief Complaint  Patient presents with  . Follow-up CHF    pt reports no complaints    History of Present Illness:  Thomas [name] is a 84 y.o. male with chronic systolic heart failure related to ischemic cardiomyopathy, previous coronary bypass surgery and previous stent in SVG-OM for acute non-STEMI in 2015, long-term persistent atrial fibrillation, history of second degree Mobitz type II AV block, CRT-D Medtronic device implanted 2014.  As always, we need the services of a sign language interpreter.   He has recently had problems with GI bleeding, requiring 2u PRBC transfusions. Two areas of angioectasia were treated in the stomach and jejunum. Most recent Hgb on 7/28 was still low at 8.2 (stable from 07/03).  He was seen in [facility] ED on 07/28 for dyspnea and chest pain. Troponin was normal. CXR showed mild CHF with an increased L pleural effusion.  I do not think he was given extra diuretics.  The patient specifically denies any chest pain at rest exertion, dyspnea at rest or with exertion, orthopnea, paroxysmal nocturnal dyspnea, syncope, palpitations, focal neurological deficits, intermittent claudication, lower extremity edema, unexplained weight gain, cough, hemoptysis or wheezing.  Despite his lack of subjective complaints, he appears mildly tachypneic, has evident JVD 8-9 cm and tight bilateral calf edema to the knees.  His weight is substantially up compared to baseline dry weight of approximately 225 lb (and he may have lost some true weight since then with all his GI issues).  ECG on 10/11/2018 showed atrial fibrillation with 100% V pacing.  Last device interrogation in clinic showed 97.7% BiV pacing and escape rhythm at 30-40 bpm.   He had bypass surgery in 1995. Left ventricle systolic  function deteriorated in 2014 without options for revascularization by cardiac catheterization and he underwent implantation of his CRT-D in October 2014. He had substantial clinical improvement and left ventricular EF also improved to 45-50%. In July 2015 he had an acute non-STEMI with placement of a stent to the SVG to OM (bare-metal 3.512 mm MultiLink vision). Native coronary arteries were occluded, LIMA to LAD was patent with a 50-70% stenosis at the anastomosis and chronic total occlusion of SVG-RCA.  Past Medical History:  Diagnosis Date  . AICD (automatic cardioverter/defibrillator) present   . Biventricular ICD (implantable cardioverter-defibrillator) in place 03/24/2005   Implantation of a Medtronic Adapta ADDRO1, serial number [id number]  . CHF (congestive heart failure) ([place])   . CKD (chronic kidney disease), stage III ([place])   . Coronary artery disease    a. s/p CABG 1986. b. Multiple PCIs/caths. c. 09/2013: s/p PTCA and BMS to SVG-OM.  [name]   . Dysrhythmia   . History of abdominal aortic aneurysm   . History of bleeding peptic ulcer 1980  . History of epididymitis 2013  . HTN (hypertension)   . Hydronephrosis with ureteropelvic junction obstruction   . Hydroureter on left 2009  . Hypertension   . Ischemic cardiomyopathy    a. Prior EF 30-35%, s/p BIV-ICD. b. 09/2013: EF 45-50%.  . Moderate tricuspid regurgitation   . PAF (paroxysmal atrial fibrillation) ([place])   . Presence of permanent cardiac pacemaker   . Prostate cancer ([place])   . Status post coronary artery bypass grafting 1986   LIMA to the LAD, SVG  to OM, SVG to RCA  . Testicular swelling     Past Surgical History:  Procedure Laterality Date  . 2-D echocardiogram  11/20/2011   Ejection fraction 30-35% moderate concentric left ventricular hypertrophy. Left atrium is moderately dilated. Mild MR. Mild or  . BI-VENTRICULAR IMPLANTABLE CARDIOVERTER DEFIBRILLATOR N/A 12/16/2012   Procedure: BI-VENTRICULAR IMPLANTABLE  CARDIOVERTER DEFIBRILLATOR  (CRT-D);  Surgeon: [name], MD;  Location: [facility] CATH LAB;  Service: Cardiovascular;  Laterality: N/A;  . CARDIAC CATHETERIZATION  12/10/2011   SVG to OM widely patent.  LIMA to LAD patent  . CATARACT EXTRACTION W/PHACO Right 10/12/2017   Procedure: CATARACT EXTRACTION PHACO AND INTRAOCULAR LENS PLACEMENT (IOC);  Surgeon: Birder Robson, MD;  Location: [facility] ORS;  Service: Ophthalmology;  Laterality: Right;  [place] 00:57 AP% 15.9 CDE 9.07 Fluid pack lot # [id number]  . COLONOSCOPY N/A 07/13/2018   Procedure: COLONOSCOPY;  Surgeon: Toledo, Benay Pike, MD;  Location: [facility] ENDOSCOPY;  Service: Gastroenterology;  Laterality: N/A;  . CORONARY ARTERY BYPASS GRAFT  1986  . ENTEROSCOPY N/A 09/14/2018   Procedure: ENTEROSCOPY;  Surgeon: Toledo, Benay Pike, MD;  Location: [facility] ENDOSCOPY;  Service: Gastroenterology;  Laterality: N/A;  symptomatic anemia, GI blood loss anemia, melena, positive small bowel capsule endoscopy showing source of bleeding   . ESOPHAGOGASTRODUODENOSCOPY N/A 07/13/2018   Procedure: ESOPHAGOGASTRODUODENOSCOPY (EGD);  Surgeon: Toledo, Benay Pike, MD;  Location: [facility] ENDOSCOPY;  Service: Gastroenterology;  Laterality: N/A;  . ESOPHAGOGASTRODUODENOSCOPY N/A 09/14/2018   Procedure: ESOPHAGOGASTRODUODENOSCOPY (EGD);  Surgeon: Toledo, Benay Pike, MD;  Location: [facility] ENDOSCOPY;  Service: Gastroenterology;  Laterality: N/A;  SIGN LANAGUAGE INTERPRETER  . ESOPHAGOGASTRODUODENOSCOPY (EGD) WITH PROPOFOL N/A 05/27/2018   Procedure: ESOPHAGOGASTRODUODENOSCOPY (EGD) WITH PROPOFOL;  Surgeon: Clarene Essex, MD;  Location: [place];  Service: Endoscopy;  Laterality: N/A;  . INSERT / REPLACE / REMOVE PACEMAKER    . LEFT HEART CATHETERIZATION WITH CORONARY/GRAFT ANGIOGRAM N/A 12/10/2011   Procedure: LEFT HEART CATHETERIZATION WITH Beatrix Fetters;  Surgeon: Sanda Klein, MD;  Location: [place] CATH LAB;  Service: Cardiovascular;  Laterality: N/A;  . LEFT HEART CATHETERIZATION  WITH CORONARY/GRAFT ANGIOGRAM N/A 09/25/2013   Procedure: LEFT HEART CATHETERIZATION WITH Beatrix Fetters;  Surgeon: Blane Ohara, MD;  Location: [facility] CATH LAB;  Service: Cardiovascular;  Laterality: N/A;  . Persantine Myoview  05/06/2010   Post-rest ejection fraction 30%. No significant ischemia demonstrated. Compared to previous study there is no significant change.  . TRANSURETHRAL RESECTION OF PROSTATE     s/p    Current Medications: Outpatient Medications Prior to Visit  Medication Sig Dispense Refill  . albuterol (VENTOLIN HFA) 108 (90 Base) MCG/ACT inhaler Inhale 2 puffs into the lungs 4 (four) times daily as needed. (Patient taking differently: Inhale 2 puffs into the lungs 4 (four) times daily as needed for wheezing or shortness of breath. ) 1 Inhaler 1  . carvedilol (COREG) 3.125 MG tablet Take 1 tablet (3.125 mg total) by mouth 2 (two) times daily with a meal. 60 tablet 0  . ferrous sulfate 325 (65 FE) MG tablet Take 1 tablet (325 mg total) by mouth daily for 30 days. 30 tablet 0  . HYDROCORTISONE, TOPICAL, 2 % LOTN Apply 40 oz topically 2 (two) times daily as needed (as needed for itching on foot). APPLY BID TO AFFECTED AREA 1 Bottle 3  . isosorbide mononitrate (IMDUR) 30 MG 24 hr tablet Take 1 tablet (30 mg total) by mouth daily. 30 tablet 0  . losartan (COZAAR) 25 MG tablet Take 1 tablet (25 mg total)  by mouth daily. 30 tablet 0  . pantoprazole (PROTONIX) 40 MG tablet Take 1 tablet (40 mg total) by mouth 2 (two) times daily before a meal. 60 tablet 0  . simvastatin (ZOCOR) 20 MG tablet Take 1 tablet (20 mg total) by mouth daily. 90 tablet 2  . vitamin B-12 (CYANOCOBALAMIN) 500 MCG tablet Take 1 tablet (500 mcg total) by mouth daily. 90 tablet 1  . furosemide (LASIX) 40 MG tablet Take 1 tablet (40 mg total) by mouth daily. 90 tablet 3   No facility-administered medications prior to visit.      Allergies:   Phenazopyridine and Ramipril   Social History    Socioeconomic History  . Marital status: Married    Spouse name: Not on file  . Number of children: Not on file  . Years of education: Not on file  . Highest education level: Not on file  Occupational History  . Not on file  Social Needs  . Financial resource strain: Not on file  . Food insecurity    Worry: Not on file    Inability: Not on file  . Transportation needs    Medical: Not on file    Non-medical: Not on file  Tobacco Use  . Smoking status: Former Smoker    Quit date: 03/15/1985    Years since quitting: 33.6  . Smokeless tobacco: Never Used  Substance and Sexual Activity  . Alcohol use: No    Comment: occas.  . Drug use: No  . Sexual activity: Yes  Lifestyle  . Physical activity    Days per week: Not on file    Minutes per session: Not on file  . Stress: Not on file  Relationships  . Social Herbalist on phone: Not on file    Gets together: Not on file    Attends religious service: Not on file    Active member of club or organization: Not on file    Attends meetings of clubs or organizations: Not on file    Relationship status: Not on file  Other Topics Concern  . Not on file  Social History Narrative  . Not on file     Family History:  The patient's family history includes Hypertension in his father.   ROS:   Please see the history of present illness.    ROS All other systems are reviewed  And are negative.  PHYSICAL EXAM:   VS:  BP 120/73   Pulse 91   Ht 6\' 2"  (1.88 m)   Wt 240 lb 9.6 oz (109.1 kg)   SpO2 96%   BMI 30.89 kg/m     General: Alert, oriented x3, no distress, smiling, pale Head: no evidence of trauma, PERRL, EOMI, no exophtalmos or lid lag, no myxedema, no xanthelasma; normal ears, nose and oropharynx Neck: 8-9 cm elevation in jugular venous pulsations and prompt hepatojugular reflux; brisk carotid pulses without delay and no carotid bruits Chest: clear to auscultation, no signs of consolidation by percussion or  palpation, normal fremitus, symmetrical and full respiratory excursions Cardiovascular: normal position and quality of the apical impulse, regular rhythm, normal first and paradoxically split second heart sounds, 3/6 mid precordial holosystolic murmur, no diastolic murmurs, rubs or gallops Abdomen: no tenderness or distention, no masses by palpation, no abnormal pulsatility or arterial bruits, normal bowel sounds, no hepatosplenomegaly Extremities: no clubbing, cyanosis; symmetrical 3+ calf edema to the knees; 2+ radial, ulnar and brachial pulses bilaterally; 2+ right femoral, posterior tibial  and dorsalis pedis pulses; 2+ left femoral, posterior tibial and dorsalis pedis pulses; no subclavian or femoral bruits Neurological: grossly nonfocal Psych: Normal mood and affect   Wt Readings from Last 3 Encounters:  10/27/18 240 lb 9.6 oz (109.1 kg)  10/11/18 232 lb (105.2 kg)  09/16/18 226 lb 6.6 oz (102.7 kg)      Studies/Labs Reviewed:   EKG:  EKG is ordered today.  AFib, 100% V paced  Recent Labs: 08/11/2018: B Natriuretic Peptide 316.9 09/13/2018: ALT 14; TSH 4.939 09/15/2018: Magnesium 2.2 10/11/2018: BUN 35; Creatinine, Ser 1.20; Hemoglobin 8.2; Platelets 233; Potassium 4.3; Sodium 142   Lipid Panel    Component Value Date/Time   CHOL 92 11/01/2015 0331   CHOL 139 11/20/2011 0624   TRIG 76 11/01/2015 0331   TRIG 108 11/20/2011 0624   HDL 31 (L) 11/01/2015 0331   HDL 26 (L) 11/20/2011 0624   CHOLHDL 3.0 11/01/2015 0331   VLDL 15 11/01/2015 0331   VLDL 22 11/20/2011 0624   LDLCALC 46 11/01/2015 0331   LDLCALC 91 11/20/2011 0624     ASSESSMENT:    No diagnosis found.  PLAN:  In order of problems listed above:  1. CHF: Clearly hypervolemic, likely CHF exacerbation due to anemia.  He always has some persistent edema in his legs, related to peripheral venous insufficiency. He is wearing compression stockings. Moderate ischemic cardiomyopathy, most recent estimated LVEF 45%.  Reminded him about the need for sodium restriction and how to monitor his weight on a daily basis. Increased diuretic dose today to furosemide 80 mg daily and added KCl supplement. 2. Anemia: on Fe supplement, off anticoagulants, still Hgb 8 for last month. Add folic acid. 3. CAD: Suspect angina 07/28 was anemia and CHF related. He has not had angina pectoris since then. He is status post CABG and PCI of SVG to OM. Patent LIMA to LAD. Occluded native arteries and occluded SVG to RCA. He is on 2 antianginal medications: Beta blockers and long-acting nitrates 4. AFib: Never has RVR. CHADSVasc 7 (age 87, TIA 2, HTN, CAD, CHF). Temporarily off anticoagulation until his anemia shows improvemnt. 5. Eliquis anticoagulation. Has had bleeding due to gastric and jejunal angioectasia recently and is currently off anticoagulation.  6. High-grade AV block: Has underlying escape rhythm <40. He is not device dependent, but is 97% BiV paced. 7. CRT-D (Medtronic Viva quad XT)  With normal device function, due for a download August 15, next appointment with Dr. [name] in November.  8. Hyperlipidemia on statin. 9. HTN: excellent blood pressure control, no symptoms of hypotension.  10. TR: Loud TR murmur today. His echo in February 2020 shows  dilation of the right ventricle with mildly reduced right ventricular systolic function. Tricuspid regurgitation was described as being only mild. He has at least moderate TR based on clinical criteria. 10.  Congenital deafness - always needs a sign language interpreter  Medication Adjustments/Labs and Tests Ordered: Current medicines are reviewed at length with the patient today.  Concerns regarding medicines are outlined above.  Medication changes, Labs and Tests ordered today are listed in the Patient Instructions below.  Patient Instructions  Medication Instructions:  INCREASE the Furosemide to 80 mg (2 tablets) once daily START Potassium 20 mEq once daily START Folic Acid 1  mg once daily  If you need a refill on your cardiac medications before your next appointment, please call your pharmacy.   Lab work: None ordered If you have labs (blood work) drawn today and your  tests are completely normal, you will receive your results only by: [name] MyChart Message (if you have MyChart) OR . A paper copy in the mail If you have any lab test that is abnormal or we need to change your treatment, we will call you to review the results.  Testing/Procedures: None ordered  Follow-Up: Follow up in 3 months for a virtual visit with an APP  Any Other Special Instructions Will Be Listed Below (If Applicable). Please call in one week or send a message through MyChart with a current weight     Signed, Sanda Klein, MD  11/02/2018 2:51 PM    [address], [place], [place]  [address] Phone: [phone]; Fax: [phone]

## 2018-10-27 NOTE — Patient Instructions (Addendum)
Medication Instructions:  INCREASE the Furosemide to 80 mg (2 tablets) once daily START Potassium 20 mEq once daily START Folic Acid 1 mg once daily  If you need a refill on your cardiac medications before your next appointment, please call your pharmacy.   Lab work: None ordered If you have labs (blood work) drawn today and your tests are completely normal, you will receive your results only by: [name] MyChart Message (if you have MyChart) OR . A paper copy in the mail If you have any lab test that is abnormal or we need to change your treatment, we will call you to review the results.  Testing/Procedures: None ordered  Follow-Up: Follow up in 3 months for a virtual visit with an APP  Any Other Special Instructions Will Be Listed Below (If Applicable). Please call in one week or send a message through MyChart with a current weight

## 2018-11-02 DIAGNOSIS — D500 Iron deficiency anemia secondary to blood loss (chronic): Secondary | ICD-10-CM | POA: Insufficient documentation

## 2018-11-02 DIAGNOSIS — I25708 Atherosclerosis of coronary artery bypass graft(s), unspecified, with other forms of angina pectoris: Secondary | ICD-10-CM | POA: Insufficient documentation

## 2018-11-02 DIAGNOSIS — E7800 Pure hypercholesterolemia, unspecified: Secondary | ICD-10-CM | POA: Insufficient documentation

## 2018-11-02 DIAGNOSIS — Z95810 Presence of automatic (implantable) cardiac defibrillator: Secondary | ICD-10-CM | POA: Insufficient documentation

## 2018-11-03 ENCOUNTER — Ambulatory Visit (INDEPENDENT_AMBULATORY_CARE_PROVIDER_SITE_OTHER): Payer: PPO | Admitting: *Deleted

## 2018-11-03 ENCOUNTER — Telehealth: Payer: Self-pay | Admitting: Student

## 2018-11-03 DIAGNOSIS — I5023 Acute on chronic systolic (congestive) heart failure: Secondary | ICD-10-CM

## 2018-11-03 DIAGNOSIS — I4821 Permanent atrial fibrillation: Secondary | ICD-10-CM

## 2018-11-03 LAB — CUP PACEART REMOTE DEVICE CHECK
Battery Remaining Longevity: 8 mo
Battery Voltage: 2.86 V
Brady Statistic AP VP Percent: 0 %
Brady Statistic AP VS Percent: 0 %
Brady Statistic AS VP Percent: 96.35 %
Brady Statistic AS VS Percent: 3.65 %
Brady Statistic RA Percent Paced: 0 %
Brady Statistic RV Percent Paced: 97.72 %
Date Time Interrogation Session: 20200820145724
HighPow Impedance: 56 Ohm
Implantable Lead Implant Date: 20000210
Implantable Lead Implant Date: 20000210
Implantable Lead Implant Date: 20141003
Implantable Lead Location: 753858
Implantable Lead Location: 753859
Implantable Lead Location: 753860
Implantable Lead Model: 4244
Implantable Lead Model: 4285
Implantable Lead Model: 4298
Implantable Lead Serial Number: 272469
Implantable Lead Serial Number: 413633
Implantable Pulse Generator Implant Date: 20141003
Lead Channel Impedance Value: 285 Ohm
Lead Channel Impedance Value: 323 Ohm
Lead Channel Impedance Value: 323 Ohm
Lead Channel Impedance Value: 323 Ohm
Lead Channel Impedance Value: 342 Ohm
Lead Channel Impedance Value: 380 Ohm
Lead Channel Impedance Value: 399 Ohm
Lead Channel Impedance Value: 456 Ohm
Lead Channel Impedance Value: 551 Ohm
Lead Channel Impedance Value: 570 Ohm
Lead Channel Impedance Value: 570 Ohm
Lead Channel Impedance Value: 608 Ohm
Lead Channel Impedance Value: 608 Ohm
Lead Channel Pacing Threshold Amplitude: 0.75 V
Lead Channel Pacing Threshold Amplitude: 1.625 V
Lead Channel Pacing Threshold Pulse Width: 0.4 ms
Lead Channel Pacing Threshold Pulse Width: 0.6 ms
Lead Channel Sensing Intrinsic Amplitude: 0.875 mV
Lead Channel Sensing Intrinsic Amplitude: 4.5 mV
Lead Channel Setting Pacing Amplitude: 2 V
Lead Channel Setting Pacing Amplitude: 2.75 V
Lead Channel Setting Pacing Pulse Width: 0.4 ms
Lead Channel Setting Pacing Pulse Width: 0.6 ms
Lead Channel Setting Sensing Sensitivity: 0.3 mV

## 2018-11-03 NOTE — Telephone Encounter (Signed)
Worked via Astronomer to help patient send manual transmission.   His wife states he was home on 09/28/18, and isn't sure why remote check didn't go through.   Pt tried repeatedly while I was on the phone to send. He would hold it up to his chest and get a green "check mark", but we have not received any data on our end.   They were given number for Medtronic Tachy tech support at [phone] and will attempt to troubleshoot with the continued assistance of the interpreter.   We will continue to monitor for his transmission.    Legrand Como [address]" [place], PA-C 11/03/2018 1:52 PM

## 2018-11-03 NOTE — Telephone Encounter (Signed)
Received transmission. Optivol was up significantly with thoracic impedence low.  He is trending towards normal since diuretic adjustment as below.   He was feeling slightly better, with a primary complaint of continued fatigue per wife via interpreter.       Legrand Como [address] [place], PA-C 11/03/2018 2:40 PM

## 2018-11-04 NOTE — Telephone Encounter (Signed)
Thanks,  Thomas [name]

## 2018-11-05 NOTE — Progress Notes (Signed)
Patient ID: Thomas [name], male       DOB: [birth year], 84 y.o.      MRN [id number]  Entire visit was done in the presence of the sign language interpreter  HPI  Mr Willinger is a 84 y/o male with a history of CAD,HTN, CKD, PAF, peptic ulcer, bilateral deafness, previous tobacco use and chronic heart failure.   Echo report from 05/02/2018 reviewed and showed an EF of 35-40% along with mild/ moderate MR, mild AR and moderate pulmonary HTN.Echo report from 11/25/15 reviewed and showed an EF of 45-50% along with mild AR, moderate MR and mild-moderate TR.   Was in the ED 10/11/2018 due to shortness of breath/ episode of chest pain. Symptoms had resolved prior to arriving at the ED. Evaluated and released. Admitted 09/13/2018 due to GIB. Initially hypotensive. GI consult obtained. EGD done and cautery of angioectasias was done. 2 units of blood given. Discharged after 3 days.  Admitted 08/11/2018 due to weakness, edema and recent falls. Cardiology consult obtained. Initially needed IV lasix and then developed hypotension. Lost ~ 7L during admission. Discharged after 6 days. Admitted 05/25/2018 due to anemia and HF. GI consulted. Had EGD done and had iron infusion. Discharged after 3 days.   He presents today for a follow-up visit with a chief complaint of shortness of breath upon moderate exertion. He describes this as chronic in nature having been present for several years but he does feel like it's worsening some. He was able to walk to the office from the [facility] although was short of breath upon arrival. He has associated fatigue, pedal edema, palpitations and gradual weight gain along with this. He denies any difficulty sleeping, dizziness, abdominal distention or cough.   Past Medical History:  Diagnosis Date  . AICD (automatic cardioverter/defibrillator) present   . Biventricular ICD (implantable cardioverter-defibrillator) in place 03/24/2005   Implantation of a Medtronic Adapta ADDRO1, serial number  [id number]  . CHF (congestive heart failure) ([place])   . CKD (chronic kidney disease), stage III ([place])   . Coronary artery disease    a. s/p CABG 1986. b. Multiple PCIs/caths. c. 09/2013: s/p PTCA and BMS to SVG-OM.  [name]   . Dysrhythmia   . History of abdominal aortic aneurysm   . History of bleeding peptic ulcer 1980  . History of epididymitis 2013  . HTN (hypertension)   . Hydronephrosis with ureteropelvic junction obstruction   . Hydroureter on left 2009  . Hypertension   . Ischemic cardiomyopathy    a. Prior EF 30-35%, s/p BIV-ICD. b. 09/2013: EF 45-50%.  . Moderate tricuspid regurgitation   . PAF (paroxysmal atrial fibrillation) ([place])   . Presence of permanent cardiac pacemaker   . Prostate cancer ([place])   . Status post coronary artery bypass grafting 1986   LIMA to the LAD, SVG to OM, SVG to RCA  . Testicular swelling    Past Surgical History:  Procedure Laterality Date  . 2-D echocardiogram  11/20/2011   Ejection fraction 30-35% moderate concentric left ventricular hypertrophy. Left atrium is moderately dilated. Mild MR. Mild or  . BI-VENTRICULAR IMPLANTABLE CARDIOVERTER DEFIBRILLATOR N/A 12/16/2012   Procedure: BI-VENTRICULAR IMPLANTABLE CARDIOVERTER DEFIBRILLATOR  (CRT-D);  Surgeon: [name], MD;  Location: [facility] CATH LAB;  Service: Cardiovascular;  Laterality: N/A;  . CARDIAC CATHETERIZATION  12/10/2011   SVG to OM widely patent.  LIMA to LAD patent  . CATARACT EXTRACTION W/PHACO Right 10/12/2017   Procedure: CATARACT EXTRACTION PHACO AND INTRAOCULAR LENS PLACEMENT (  [place]);  Surgeon: Birder Robson, MD;  Location: [facility] ORS;  Service: Ophthalmology;  Laterality: Right;  [place] 00:57 AP% 15.9 CDE 9.07 Fluid pack lot # [id number]  . COLONOSCOPY N/A 07/13/2018   Procedure: COLONOSCOPY;  Surgeon: Toledo, Benay Pike, MD;  Location: [facility] ENDOSCOPY;  Service: Gastroenterology;  Laterality: N/A;  . CORONARY ARTERY BYPASS GRAFT  1986  . ENTEROSCOPY N/A 09/14/2018   Procedure:  ENTEROSCOPY;  Surgeon: Toledo, Benay Pike, MD;  Location: [facility] ENDOSCOPY;  Service: Gastroenterology;  Laterality: N/A;  symptomatic anemia, GI blood loss anemia, melena, positive small bowel capsule endoscopy showing source of bleeding   . ESOPHAGOGASTRODUODENOSCOPY N/A 07/13/2018   Procedure: ESOPHAGOGASTRODUODENOSCOPY (EGD);  Surgeon: Toledo, Benay Pike, MD;  Location: [facility] ENDOSCOPY;  Service: Gastroenterology;  Laterality: N/A;  . ESOPHAGOGASTRODUODENOSCOPY N/A 09/14/2018   Procedure: ESOPHAGOGASTRODUODENOSCOPY (EGD);  Surgeon: Toledo, Benay Pike, MD;  Location: [facility] ENDOSCOPY;  Service: Gastroenterology;  Laterality: N/A;  SIGN LANAGUAGE INTERPRETER  . ESOPHAGOGASTRODUODENOSCOPY (EGD) WITH PROPOFOL N/A 05/27/2018   Procedure: ESOPHAGOGASTRODUODENOSCOPY (EGD) WITH PROPOFOL;  Surgeon: Clarene Essex, MD;  Location: [place];  Service: Endoscopy;  Laterality: N/A;  . INSERT / REPLACE / REMOVE PACEMAKER    . LEFT HEART CATHETERIZATION WITH CORONARY/GRAFT ANGIOGRAM N/A 12/10/2011   Procedure: LEFT HEART CATHETERIZATION WITH Beatrix Fetters;  Surgeon: Sanda Klein, MD;  Location: [place] CATH LAB;  Service: Cardiovascular;  Laterality: N/A;  . LEFT HEART CATHETERIZATION WITH CORONARY/GRAFT ANGIOGRAM N/A 09/25/2013   Procedure: LEFT HEART CATHETERIZATION WITH Beatrix Fetters;  Surgeon: Blane Ohara, MD;  Location: [facility] CATH LAB;  Service: Cardiovascular;  Laterality: N/A;  . Persantine Myoview  05/06/2010   Post-rest ejection fraction 30%. No significant ischemia demonstrated. Compared to previous study there is no significant change.  . TRANSURETHRAL RESECTION OF PROSTATE     s/p   Family History  Problem Relation Age of Onset  . Hypertension Father    Social History   Tobacco Use  . Smoking status: Former Smoker    Quit date: 03/15/1985    Years since quitting: 33.6  . Smokeless tobacco: Never Used  Substance Use Topics  . Alcohol use: No    Comment: occas.   Allergies   Allergen Reactions  . Phenazopyridine Nausea Only and Other (See Comments)    GI UPSET  . Ramipril Other (See Comments)    unk Other reaction(s): Other (See Comments), Unknown unk    Prior to Admission medications   Medication Sig Start Date End Date Taking? Authorizing Provider  albuterol (VENTOLIN HFA) 108 (90 Base) MCG/ACT inhaler Inhale 2 puffs into the lungs 4 (four) times daily as needed. Patient taking differently: Inhale 2 puffs into the lungs 4 (four) times daily as needed for wheezing or shortness of breath.  08/02/18  Yes Croitoru, Mihai, MD  carvedilol (COREG) 3.125 MG tablet Take 1 tablet (3.125 mg total) by mouth 2 (two) times daily with a meal. 08/17/18  Yes Lavina Hamman, MD  ferrous sulfate 325 (65 FE) MG tablet Take 1 tablet (325 mg total) by mouth daily for 30 days. 05/28/18  Yes Elodia Florence., MD  folic acid (FOLVITE) 1 MG tablet Take 1 tablet (1 mg total) by mouth daily. 10/27/18  Yes Croitoru, Mihai, MD  furosemide (LASIX) 40 MG tablet Take 2 tablets (80 mg total) by mouth daily. 10/27/18  Yes Croitoru, Mihai, MD  HYDROCORTISONE, TOPICAL, 2 % LOTN Apply 40 oz topically 2 (two) times daily as needed (as needed for itching on foot). APPLY BID TO AFFECTED  AREA 08/23/18  Yes Croitoru, Mihai, MD  isosorbide mononitrate (IMDUR) 30 MG 24 hr tablet Take 1 tablet (30 mg total) by mouth daily. 08/18/18  Yes Lavina Hamman, MD  losartan (COZAAR) 25 MG tablet Take 1 tablet (25 mg total) by mouth daily. 08/18/18  Yes Lavina Hamman, MD  pantoprazole (PROTONIX) 40 MG tablet Take 1 tablet (40 mg total) by mouth 2 (two) times daily before a meal. 09/16/18  Yes Demetrios Loll, MD  potassium chloride SA (K-DUR) 20 MEQ tablet Take 1 tablet (20 mEq total) by mouth daily. 10/27/18 01/25/19 Yes Croitoru, Mihai, MD  simvastatin (ZOCOR) 20 MG tablet Take 1 tablet (20 mg total) by mouth daily. 06/14/18  Yes Lendon Colonel, NP  vitamin B-12 (CYANOCOBALAMIN) 500 MCG tablet Take 1 tablet (500 mcg  total) by mouth daily. 08/18/18  Yes Maisie Fus, MD     Review of Systems  Constitutional: Positive for fatigue. Negative for appetite change.  HENT: Positive for hearing loss. Negative for congestion, rhinorrhea and sore throat.   Eyes: Negative.   Respiratory: Positive for shortness of breath. Negative for cough and chest tightness.   Cardiovascular: Positive for palpitations and leg swelling. Negative for chest pain.  Gastrointestinal: Negative for abdominal distention and abdominal pain.  Endocrine: Negative.   Genitourinary: Negative.   Musculoskeletal: Positive for arthralgias (left shoulder). Negative for back pain.  Skin: Negative.   Allergic/Immunologic: Negative.   Neurological: Negative for dizziness and light-headedness.  Hematological: Negative for adenopathy. Bruises/bleeds easily.  Psychiatric/Behavioral: Negative for dysphoric mood and sleep disturbance (sleeping on 2 pillows). The patient is not nervous/anxious.    Vitals:   11/07/18 1337  BP: 119/60  Pulse: 81  Resp: 18  Temp: 98.1 F (36.7 C)  TempSrc: Oral  SpO2: 94%  Weight: 239 lb 3.2 oz (108.5 kg)  Height: 6\' 2"  (1.88 m)   Wt Readings from Last 3 Encounters:  11/07/18 239 lb 3.2 oz (108.5 kg)  10/27/18 240 lb 9.6 oz (109.1 kg)  10/11/18 232 lb (105.2 kg)   Lab Results  Component Value Date   CREATININE 1.20 10/11/2018   CREATININE 1.23 09/15/2018   CREATININE 1.17 09/13/2018     Physical Exam Vitals signs and nursing note reviewed.  Constitutional:      Appearance: He is well-developed.  HENT:     Head: Normocephalic and atraumatic.     Ears:     Comments: Completely deaf in both ears Neck:     Musculoskeletal: Normal range of motion and neck supple.  Cardiovascular:     Rate and Rhythm: Normal rate and regular rhythm.  Pulmonary:     Effort: Pulmonary effort is normal.     Breath sounds: Normal breath sounds. No rhonchi or rales.  Abdominal:     Palpations: Abdomen is soft.      Tenderness: There is no abdominal tenderness.  Musculoskeletal:     Right lower leg: He exhibits no tenderness. Edema (1+ pitting) present.     Left lower leg: He exhibits no tenderness. Edema (1+ pitting) present.  Skin:    General: Skin is warm and dry.  Neurological:     Mental Status: He is alert and oriented to person, place, and time.     Motor: No weakness.  Psychiatric:        Mood and Affect: Mood normal.        Behavior: Behavior normal.    Assessment & Plan:  1: Chronic heart failure with reduced ejection  fraction- - NYHA class II - mildly fluid overloaded with edema and shortness of breath - not weighing daily and he was instructed to resume weighing daily and write the weight down so that he can call for an overnight weight gain of >2 pounds or a weekly weight gain of >5 pounds - weight up 8 pounds from last visit here 2 months ago - not adding salt and has been reading food labels. Reviewed the importance of closely following a 2000mg  sodium diet  - drinks 1 cup of coffee and ~ 48 ounces of water daily - will stop losartan and begin entresto 24/26mg  twice daily; voucher given so that he gets the first 30 days of medication at no charge - will get BMP at next visit - remains active and still works Tues-Friday - saw cardiology (Croitoru) 10/27/2018 - has ICD and saw EP [name]) 07/20/2018 - BNP 08/11/2018 was 316.9  2: HTN- - BP looks good today - saw PCP Kary Kos) 10/10/2018 - BMP from 10/11/2018 reviewed and showed sodium 142, potassium 4.3, creatinine 1.2 and GFR 56  3: Lymphedema- - stage 2 - has been elevating his legs more often during the day when he's off work and then when he gets home in the afternoons - patient wearing compression socks but edema persists - limited in his ability to exercise due to his shortness of breath - will make referral for lymphapress compression boots again as patient's wife says that she doesn't think she's heard from them  Patient  did not bring his medications nor a list. Each medication was verbally reviewed with the patient and he was encouraged to bring the bottles to every visit to confirm accuracy of list.  Return in 1 month or sooner for any questions/problems before then    Entire visit was done in the presence of the sign language interpreter

## 2018-11-07 ENCOUNTER — Ambulatory Visit: Payer: PPO | Attending: Family | Admitting: Family

## 2018-11-07 ENCOUNTER — Encounter: Payer: Self-pay | Admitting: Family

## 2018-11-07 ENCOUNTER — Other Ambulatory Visit: Payer: Self-pay

## 2018-11-07 VITALS — BP 119/60 | HR 81 | Temp 98.1°F | Resp 18 | Ht 74.0 in | Wt 239.2 lb

## 2018-11-07 DIAGNOSIS — H9193 Unspecified hearing loss, bilateral: Secondary | ICD-10-CM | POA: Diagnosis not present

## 2018-11-07 DIAGNOSIS — Z8546 Personal history of malignant neoplasm of prostate: Secondary | ICD-10-CM | POA: Insufficient documentation

## 2018-11-07 DIAGNOSIS — I890 Lymphedema, not elsewhere classified: Secondary | ICD-10-CM | POA: Insufficient documentation

## 2018-11-07 DIAGNOSIS — Z95810 Presence of automatic (implantable) cardiac defibrillator: Secondary | ICD-10-CM | POA: Insufficient documentation

## 2018-11-07 DIAGNOSIS — Z79899 Other long term (current) drug therapy: Secondary | ICD-10-CM | POA: Insufficient documentation

## 2018-11-07 DIAGNOSIS — Z888 Allergy status to other drugs, medicaments and biological substances status: Secondary | ICD-10-CM | POA: Insufficient documentation

## 2018-11-07 DIAGNOSIS — Z8249 Family history of ischemic heart disease and other diseases of the circulatory system: Secondary | ICD-10-CM | POA: Diagnosis not present

## 2018-11-07 DIAGNOSIS — I255 Ischemic cardiomyopathy: Secondary | ICD-10-CM | POA: Diagnosis not present

## 2018-11-07 DIAGNOSIS — Z8711 Personal history of peptic ulcer disease: Secondary | ICD-10-CM | POA: Insufficient documentation

## 2018-11-07 DIAGNOSIS — I130 Hypertensive heart and chronic kidney disease with heart failure and stage 1 through stage 4 chronic kidney disease, or unspecified chronic kidney disease: Secondary | ICD-10-CM | POA: Insufficient documentation

## 2018-11-07 DIAGNOSIS — Z87891 Personal history of nicotine dependence: Secondary | ICD-10-CM | POA: Diagnosis not present

## 2018-11-07 DIAGNOSIS — I5022 Chronic systolic (congestive) heart failure: Secondary | ICD-10-CM

## 2018-11-07 DIAGNOSIS — I480 Paroxysmal atrial fibrillation: Secondary | ICD-10-CM | POA: Insufficient documentation

## 2018-11-07 DIAGNOSIS — I2510 Atherosclerotic heart disease of native coronary artery without angina pectoris: Secondary | ICD-10-CM | POA: Diagnosis not present

## 2018-11-07 DIAGNOSIS — N183 Chronic kidney disease, stage 3 (moderate): Secondary | ICD-10-CM | POA: Diagnosis not present

## 2018-11-07 DIAGNOSIS — I509 Heart failure, unspecified: Secondary | ICD-10-CM | POA: Diagnosis present

## 2018-11-07 DIAGNOSIS — Z951 Presence of aortocoronary bypass graft: Secondary | ICD-10-CM | POA: Insufficient documentation

## 2018-11-07 DIAGNOSIS — I10 Essential (primary) hypertension: Secondary | ICD-10-CM

## 2018-11-07 MED ORDER — SACUBITRIL-VALSARTAN 24-26 MG PO TABS: 1.0000 | ORAL_TABLET | Freq: Two times a day (BID) | ORAL | 3 refills | Status: DC

## 2018-11-07 NOTE — Patient Instructions (Addendum)
Continue weighing daily and call for an overnight weight gain of > 2 pounds or a weekly weight gain of >5 pounds.  Stop taking the losartan and begin taking entresto 24/26mg  twice daily.

## 2018-11-08 ENCOUNTER — Encounter: Payer: Self-pay | Admitting: Family

## 2018-11-09 ENCOUNTER — Telehealth: Payer: Self-pay | Admitting: Family

## 2018-11-09 MED ORDER — LOSARTAN POTASSIUM 25 MG PO TABS: 25.0000 mg | ORAL_TABLET | Freq: Every day | ORAL | 3 refills | Status: DC

## 2018-11-09 NOTE — Telephone Encounter (Signed)
Interpreter services called and is using sign language with patient's wife. Thomas [name] was started 2 days ago and he has now developed arm swelling and bruising on the arm. Advised her to stop the entresto and resume the losartan as he was previously taking. Advised her to have patient prop his arm up on a pillow to reduce swelling and call us back if it doesn't improve.   Will now add entresto as an allergy.

## 2018-11-11 NOTE — Progress Notes (Signed)
Remote ICD transmission.   

## 2018-11-30 DIAGNOSIS — C4441 Basal cell carcinoma of skin of scalp and neck: Secondary | ICD-10-CM | POA: Diagnosis not present

## 2018-11-30 DIAGNOSIS — I890 Lymphedema, not elsewhere classified: Secondary | ICD-10-CM | POA: Diagnosis not present

## 2018-11-30 DIAGNOSIS — C44319 Basal cell carcinoma of skin of other parts of face: Secondary | ICD-10-CM | POA: Diagnosis not present

## 2018-11-30 DIAGNOSIS — D485 Neoplasm of uncertain behavior of skin: Secondary | ICD-10-CM | POA: Diagnosis not present

## 2018-11-30 NOTE — Progress Notes (Deleted)
Patient ID: Thomas [name], male       DOB: 11-29-2034, 84 y.o.      MRN [id number]  Entire visit was done in the presence of the sign language interpreter  HPI  Thomas [name] is a 84 y/o male with a history of CAD,HTN, CKD, PAF, peptic ulcer, bilateral deafness, previous tobacco use and chronic heart failure.   Echo report from 05/02/2018 reviewed and showed an EF of 35-40% along with mild/ moderate Thomas, mild AR and moderate pulmonary HTN.Echo report from 11/25/15 reviewed and showed an EF of 45-50% along with mild AR, moderate Thomas and mild-moderate TR.   Was in the ED 10/11/2018 due to shortness of breath/ episode of chest pain. Symptoms had resolved prior to arriving at the ED. Evaluated and released. Admitted 09/13/2018 due to GIB. Initially hypotensive. GI consult obtained. EGD done and cautery of angioectasias was done. 2 units of blood given. Discharged after 3 days.  Admitted 08/11/2018 due to weakness, edema and recent falls. Cardiology consult obtained. Initially needed IV lasix and then developed hypotension. Lost ~ 7L during admission. Discharged after 6 days. Admitted 05/25/2018 due to anemia and HF. GI consulted. Had EGD done and had iron infusion. Discharged after 3 days.   He presents today for a follow-up visit with a chief complaint of   Past Medical History:  Diagnosis Date  . AICD (automatic cardioverter/defibrillator) present   . Biventricular ICD (implantable cardioverter-defibrillator) in place 03/24/2005   Implantation of a Medtronic Adapta ADDRO1, serial number [id number]  . CHF (congestive heart failure) ([place])   . CKD (chronic kidney disease), stage III ([place])   . Coronary artery disease    a. s/p CABG 1986. b. Multiple PCIs/caths. c. 09/2013: s/p PTCA and BMS to SVG-OM.  [name]   . Dysrhythmia   . History of abdominal aortic aneurysm   . History of bleeding peptic ulcer 1980  . History of epididymitis 2013  . HTN (hypertension)   . Hydronephrosis with ureteropelvic junction  obstruction   . Hydroureter on left 2009  . Hypertension   . Ischemic cardiomyopathy    a. Prior EF 30-35%, s/p BIV-ICD. b. 09/2013: EF 45-50%.  . Moderate tricuspid regurgitation   . PAF (paroxysmal atrial fibrillation) ([place])   . Presence of permanent cardiac pacemaker   . Prostate cancer ([place])   . Status post coronary artery bypass grafting 1986   LIMA to the LAD, SVG to OM, SVG to RCA  . Testicular swelling    Past Surgical History:  Procedure Laterality Date  . 2-D echocardiogram  11/20/2011   Ejection fraction 30-35% moderate concentric left ventricular hypertrophy. Left atrium is moderately dilated. Mild Thomas. Mild or  . BI-VENTRICULAR IMPLANTABLE CARDIOVERTER DEFIBRILLATOR N/A 12/16/2012   Procedure: BI-VENTRICULAR IMPLANTABLE CARDIOVERTER DEFIBRILLATOR  (CRT-D);  Surgeon: Thomas Lance, MD;  Location: [facility] CATH LAB;  Service: Cardiovascular;  Laterality: N/A;  . CARDIAC CATHETERIZATION  12/10/2011   SVG to OM widely patent.  LIMA to LAD patent  . CATARACT EXTRACTION W/PHACO Right 10/12/2017   Procedure: CATARACT EXTRACTION PHACO AND INTRAOCULAR LENS PLACEMENT ([facility]);  Surgeon: Thomas Robson, MD;  Location: [facility] ORS;  Service: Ophthalmology;  Laterality: Right;  [place] 00:57 AP% 15.9 CDE 9.07 Fluid pack lot # [id number]  . COLONOSCOPY N/A 07/13/2018   Procedure: COLONOSCOPY;  Surgeon: Toledo, Benay Pike, MD;  Location: [facility] ENDOSCOPY;  Service: Gastroenterology;  Laterality: N/A;  . CORONARY ARTERY BYPASS GRAFT  1986  . ENTEROSCOPY N/A 09/14/2018   Procedure: ENTEROSCOPY;  Surgeon: Toledo, Benay Pike, MD;  Location: [facility] ENDOSCOPY;  Service: Gastroenterology;  Laterality: N/A;  symptomatic anemia, GI blood loss anemia, melena, positive small bowel capsule endoscopy showing source of bleeding   . ESOPHAGOGASTRODUODENOSCOPY N/A 07/13/2018   Procedure: ESOPHAGOGASTRODUODENOSCOPY (EGD);  Surgeon: Toledo, Benay Pike, MD;  Location: [facility] ENDOSCOPY;  Service: Gastroenterology;  Laterality: N/A;  .  ESOPHAGOGASTRODUODENOSCOPY N/A 09/14/2018   Procedure: ESOPHAGOGASTRODUODENOSCOPY (EGD);  Surgeon: Toledo, Benay Pike, MD;  Location: [facility] ENDOSCOPY;  Service: Gastroenterology;  Laterality: N/A;  SIGN LANAGUAGE INTERPRETER  . ESOPHAGOGASTRODUODENOSCOPY (EGD) WITH PROPOFOL N/A 05/27/2018   Procedure: ESOPHAGOGASTRODUODENOSCOPY (EGD) WITH PROPOFOL;  Surgeon: Thomas Essex, MD;  Location: [place];  Service: Endoscopy;  Laterality: N/A;  . INSERT / REPLACE / REMOVE PACEMAKER    . LEFT HEART CATHETERIZATION WITH CORONARY/GRAFT ANGIOGRAM N/A 12/10/2011   Procedure: LEFT HEART CATHETERIZATION WITH Thomas [name];  Surgeon: Thomas Klein, MD;  Location: [place] CATH LAB;  Service: Cardiovascular;  Laterality: N/A;  . LEFT HEART CATHETERIZATION WITH CORONARY/GRAFT ANGIOGRAM N/A 09/25/2013   Procedure: LEFT HEART CATHETERIZATION WITH Thomas [name];  Surgeon: Thomas Ohara, MD;  Location: [facility] CATH LAB;  Service: Cardiovascular;  Laterality: N/A;  . Persantine Myoview  05/06/2010   Post-rest ejection fraction 30%. No significant ischemia demonstrated. Compared to previous study there is no significant change.  . TRANSURETHRAL RESECTION OF PROSTATE     s/p   Family History  Problem Relation Age of Onset  . Hypertension Father    Social History   Tobacco Use  . Smoking status: Former Smoker    Quit date: 03/15/1985    Years since quitting: 33.7  . Smokeless tobacco: Never Used  Substance Use Topics  . Alcohol use: No    Comment: occas.   Allergies  Allergen Reactions  . Phenazopyridine Nausea Only and Other (See Comments)    GI UPSET  . Entresto [Sacubitril-Valsartan] Swelling    And bruising of arm  . Ramipril Other (See Comments)    unk Other reaction(s): Other (See Comments), Unknown unk      Review of Systems  Constitutional: Positive for fatigue. Negative for appetite change.  HENT: Positive for hearing loss. Negative for congestion, rhinorrhea and sore throat.    Eyes: Negative.   Respiratory: Positive for shortness of breath. Negative for cough and chest tightness.   Cardiovascular: Positive for palpitations and leg swelling. Negative for chest pain.  Gastrointestinal: Negative for abdominal distention and abdominal pain.  Endocrine: Negative.   Genitourinary: Negative.   Musculoskeletal: Positive for arthralgias (left shoulder). Negative for back pain.  Skin: Negative.   Allergic/Immunologic: Negative.   Neurological: Negative for dizziness and light-headedness.  Hematological: Negative for adenopathy. Bruises/bleeds easily.  Psychiatric/Behavioral: Negative for dysphoric mood and sleep disturbance (sleeping on 2 pillows). The patient is not nervous/anxious.      Physical Exam Vitals signs and nursing note reviewed.  Constitutional:      Appearance: He is well-developed.  HENT:     Head: Normocephalic and atraumatic.     Ears:     Comments: Completely deaf in both ears Neck:     Musculoskeletal: Normal range of motion and neck supple.  Cardiovascular:     Rate and Rhythm: Normal rate and regular rhythm.  Pulmonary:     Effort: Pulmonary effort is normal.     Breath sounds: Normal breath sounds. No rhonchi or rales.  Abdominal:     Palpations: Abdomen is soft.     Tenderness: There is no abdominal tenderness.  Musculoskeletal:  Right lower leg: He exhibits no tenderness. Edema (1+ pitting) present.     Left lower leg: He exhibits no tenderness. Edema (1+ pitting) present.  Skin:    General: Skin is warm and dry.  Neurological:     Mental Status: He is alert and oriented to person, place, and time.     Motor: No weakness.  Psychiatric:        Mood and Affect: Mood normal.        Behavior: Behavior normal.    Assessment & Plan:  1: Chronic heart failure with reduced ejection fraction- - NYHA class II - mildly fluid overloaded with edema and shortness of breath - not weighing daily and he was instructed to resume weighing  daily and write the weight down so that he can call for an overnight weight gain of >2 pounds or a weekly weight gain of >5 pounds - weight up 8 pounds from last visit here 2 months ago - not adding salt and has been reading food labels. Reviewed the importance of closely following a 2000mg  sodium diet  - drinks 1 cup of coffee and ~ 48 ounces of water daily - had arm swelling with entresto which was stopped and changed back to losartan 25mg  daily.  - remains active and still works Tues-Friday - saw cardiology (Croitoru) 10/27/2018 - has ICD and saw EP [name]) 07/20/2018 - BNP 08/11/2018 was 316.9  2: HTN- - BP looks good today - saw PCP Kary Kos) 10/10/2018 - BMP from 10/11/2018 reviewed and showed sodium 142, potassium 4.3, creatinine 1.2 and GFR 56  3: Lymphedema- - stage 2 - has been elevating his legs more often during the day when he's off work and then when he gets home in the afternoons - patient wearing compression socks but edema persists - limited in his ability to exercise due to his shortness of breath - will make referral for lymphapress compression boots again as patient's wife says that she doesn't think she's heard from them  Patient did not bring his medications nor a list. Each medication was verbally reviewed with the patient and he was encouraged to bring the bottles to every visit to confirm accuracy of list.  Return in 1 month or sooner for any questions/problems before then    Entire visit was done in the presence of the sign language interpreter

## 2018-12-05 ENCOUNTER — Telehealth: Payer: Self-pay | Admitting: Family

## 2018-12-05 ENCOUNTER — Ambulatory Visit (INDEPENDENT_AMBULATORY_CARE_PROVIDER_SITE_OTHER): Payer: PPO | Admitting: *Deleted

## 2018-12-05 ENCOUNTER — Telehealth: Payer: Self-pay | Admitting: Cardiology

## 2018-12-05 ENCOUNTER — Ambulatory Visit: Payer: PPO | Admitting: Family

## 2018-12-05 DIAGNOSIS — I255 Ischemic cardiomyopathy: Secondary | ICD-10-CM

## 2018-12-05 NOTE — Telephone Encounter (Signed)
Confirm with wife that transmission received. Patient is hearing impaired and wife interpreting for patient during call. Transmission reviewed. No alerts or episodes and normal device function.Questions answered.

## 2018-12-05 NOTE — Telephone Encounter (Signed)
Patient did not show for his [facility] appointment on 12/05/2018. Will attempt to reschedule.

## 2018-12-05 NOTE — Telephone Encounter (Signed)
New Message    1. Has your device fired? no  2. Is you device beeping? no  3. Are you experiencing draining or swelling at device site? no  4. Are you calling to see if we received your device transmission? yes  5. Have you passed out? No   Patients wife is calling to see if the remote transmission was received. Please call     Please route to [place]

## 2018-12-06 LAB — CUP PACEART REMOTE DEVICE CHECK
Battery Remaining Longevity: 8 mo
Battery Voltage: 2.85 V
Brady Statistic AP VP Percent: 0 %
Brady Statistic AP VS Percent: 0 %
Brady Statistic AS VP Percent: 97.62 %
Brady Statistic AS VS Percent: 2.38 %
Brady Statistic RA Percent Paced: 0 %
Brady Statistic RV Percent Paced: 97.85 %
Date Time Interrogation Session: 20200921041802
HighPow Impedance: 55 Ohm
Implantable Lead Implant Date: 20000210
Implantable Lead Implant Date: 20000210
Implantable Lead Implant Date: 20141003
Implantable Lead Location: 753858
Implantable Lead Location: 753859
Implantable Lead Location: 753860
Implantable Lead Model: 4244
Implantable Lead Model: 4285
Implantable Lead Model: 4298
Implantable Lead Serial Number: 272469
Implantable Lead Serial Number: 413633
Implantable Pulse Generator Implant Date: 20141003
Lead Channel Impedance Value: 228 Ohm
Lead Channel Impedance Value: 323 Ohm
Lead Channel Impedance Value: 323 Ohm
Lead Channel Impedance Value: 323 Ohm
Lead Channel Impedance Value: 323 Ohm
Lead Channel Impedance Value: 323 Ohm
Lead Channel Impedance Value: 399 Ohm
Lead Channel Impedance Value: 437 Ohm
Lead Channel Impedance Value: 513 Ohm
Lead Channel Impedance Value: 551 Ohm
Lead Channel Impedance Value: 551 Ohm
Lead Channel Impedance Value: 551 Ohm
Lead Channel Impedance Value: 551 Ohm
Lead Channel Pacing Threshold Amplitude: 0.625 V
Lead Channel Pacing Threshold Amplitude: 1.375 V
Lead Channel Pacing Threshold Pulse Width: 0.4 ms
Lead Channel Pacing Threshold Pulse Width: 0.6 ms
Lead Channel Sensing Intrinsic Amplitude: 0.875 mV
Lead Channel Sensing Intrinsic Amplitude: 0.875 mV
Lead Channel Sensing Intrinsic Amplitude: 4.125 mV
Lead Channel Sensing Intrinsic Amplitude: 4.125 mV
Lead Channel Setting Pacing Amplitude: 2 V
Lead Channel Setting Pacing Amplitude: 2.5 V
Lead Channel Setting Pacing Pulse Width: 0.4 ms
Lead Channel Setting Pacing Pulse Width: 0.6 ms
Lead Channel Setting Sensing Sensitivity: 0.3 mV

## 2018-12-13 ENCOUNTER — Encounter: Payer: Self-pay | Admitting: Cardiology

## 2018-12-13 NOTE — Progress Notes (Signed)
Remote ICD transmission.   

## 2018-12-13 NOTE — Addendum Note (Signed)
Addended by: Tiajuana Amass on: 12/13/2018 12:31 PM   Modules accepted: Level of Service

## 2019-01-05 ENCOUNTER — Ambulatory Visit (INDEPENDENT_AMBULATORY_CARE_PROVIDER_SITE_OTHER): Payer: PPO | Admitting: *Deleted

## 2019-01-05 DIAGNOSIS — I441 Atrioventricular block, second degree: Secondary | ICD-10-CM

## 2019-01-05 DIAGNOSIS — I5023 Acute on chronic systolic (congestive) heart failure: Secondary | ICD-10-CM

## 2019-01-06 LAB — CUP PACEART REMOTE DEVICE CHECK
Battery Remaining Longevity: 7 mo
Battery Voltage: 2.82 V
Brady Statistic AP VP Percent: 0 %
Brady Statistic AP VS Percent: 0 %
Brady Statistic AS VP Percent: 100 %
Brady Statistic AS VS Percent: 0 %
Brady Statistic RA Percent Paced: 0 %
Brady Statistic RV Percent Paced: 98.09 %
Date Time Interrogation Session: 20201022041607
HighPow Impedance: 64 Ohm
Implantable Lead Implant Date: 20000210
Implantable Lead Implant Date: 20000210
Implantable Lead Implant Date: 20141003
Implantable Lead Location: 753858
Implantable Lead Location: 753859
Implantable Lead Location: 753860
Implantable Lead Model: 4244
Implantable Lead Model: 4285
Implantable Lead Model: 4298
Implantable Lead Serial Number: 272469
Implantable Lead Serial Number: 413633
Implantable Pulse Generator Implant Date: 20141003
Lead Channel Impedance Value: 228 Ohm
Lead Channel Impedance Value: 323 Ohm
Lead Channel Impedance Value: 323 Ohm
Lead Channel Impedance Value: 323 Ohm
Lead Channel Impedance Value: 323 Ohm
Lead Channel Impedance Value: 323 Ohm
Lead Channel Impedance Value: 399 Ohm
Lead Channel Impedance Value: 456 Ohm
Lead Channel Impedance Value: 551 Ohm
Lead Channel Impedance Value: 551 Ohm
Lead Channel Impedance Value: 551 Ohm
Lead Channel Impedance Value: 551 Ohm
Lead Channel Impedance Value: 551 Ohm
Lead Channel Pacing Threshold Amplitude: 0.625 V
Lead Channel Pacing Threshold Amplitude: 1.625 V
Lead Channel Pacing Threshold Pulse Width: 0.4 ms
Lead Channel Pacing Threshold Pulse Width: 0.6 ms
Lead Channel Sensing Intrinsic Amplitude: 0.875 mV
Lead Channel Sensing Intrinsic Amplitude: 0.875 mV
Lead Channel Sensing Intrinsic Amplitude: 9.375 mV
Lead Channel Sensing Intrinsic Amplitude: 9.375 mV
Lead Channel Setting Pacing Amplitude: 2 V
Lead Channel Setting Pacing Amplitude: 2.75 V
Lead Channel Setting Pacing Pulse Width: 0.4 ms
Lead Channel Setting Pacing Pulse Width: 0.6 ms
Lead Channel Setting Sensing Sensitivity: 0.3 mV

## 2019-01-16 DIAGNOSIS — Z85828 Personal history of other malignant neoplasm of skin: Secondary | ICD-10-CM | POA: Diagnosis not present

## 2019-01-16 DIAGNOSIS — C4441 Basal cell carcinoma of skin of scalp and neck: Secondary | ICD-10-CM | POA: Diagnosis not present

## 2019-01-17 NOTE — Addendum Note (Signed)
Addended by: Douglass Rivers D on: 01/17/2019 01:35 PM   Modules accepted: Level of Service

## 2019-01-17 NOTE — Progress Notes (Signed)
Remote ICD transmission.   

## 2019-01-30 ENCOUNTER — Telehealth: Payer: Self-pay

## 2019-01-30 ENCOUNTER — Telehealth (INDEPENDENT_AMBULATORY_CARE_PROVIDER_SITE_OTHER): Payer: PPO | Admitting: Cardiology

## 2019-01-30 ENCOUNTER — Encounter: Payer: Self-pay | Admitting: Cardiology

## 2019-01-30 VITALS — BP 132/62 | HR 89 | Ht 74.0 in | Wt 227.0 lb

## 2019-01-30 DIAGNOSIS — I2510 Atherosclerotic heart disease of native coronary artery without angina pectoris: Secondary | ICD-10-CM

## 2019-01-30 DIAGNOSIS — I5042 Chronic combined systolic (congestive) and diastolic (congestive) heart failure: Secondary | ICD-10-CM | POA: Diagnosis not present

## 2019-01-30 DIAGNOSIS — Z95810 Presence of automatic (implantable) cardiac defibrillator: Secondary | ICD-10-CM

## 2019-01-30 DIAGNOSIS — R195 Other fecal abnormalities: Secondary | ICD-10-CM

## 2019-01-30 DIAGNOSIS — N183 Chronic kidney disease, stage 3 unspecified: Secondary | ICD-10-CM | POA: Insufficient documentation

## 2019-01-30 DIAGNOSIS — I255 Ischemic cardiomyopathy: Secondary | ICD-10-CM

## 2019-01-30 DIAGNOSIS — I4821 Permanent atrial fibrillation: Secondary | ICD-10-CM

## 2019-01-30 DIAGNOSIS — I25810 Atherosclerosis of coronary artery bypass graft(s) without angina pectoris: Secondary | ICD-10-CM

## 2019-01-30 DIAGNOSIS — H9193 Unspecified hearing loss, bilateral: Secondary | ICD-10-CM

## 2019-01-30 NOTE — Assessment & Plan Note (Signed)
AVMs documented, he has required transfusion in the past

## 2019-01-30 NOTE — Progress Notes (Signed)
Virtual Visit via Telephone Note   This visit type was conducted due to national recommendations for restrictions regarding the COVID-19 Pandemic (e.g. social distancing) in an effort to limit this patient's exposure and mitigate transmission in our community.  Due to his co-morbid illnesses, this patient is at least at moderate risk for complications without adequate follow up.  This format is felt to be most appropriate for this patient at this time.  The patient did not have access to video technology/had technical difficulties with video requiring transitioning to audio format only (telephone).  All issues noted in this document were discussed and addressed.  No physical exam could be performed with this format.  Please refer to the patient's chart for his  consent to telehealth for [facility].   Date:  01/30/2019   ID:  TREVON BERGSMA, DOB 03/01/1935, MRN [id number]  Patient Location: Home Provider Location: Home  PCP:  [name], MD  Cardiologist:  Sanda Klein, MD  Electrophysiologist:  None   Evaluation Performed:  Follow-Up Visit  Chief Complaint:  None  History of Present Illness:    ARLAND PATTILLO is a 84 y.o. male with a history of coronary disease, status post coronary artery bypass grafting in 1995 with subsequent interventions, his last intervention was July 2015 with a bare-metal stent to the SVG to OM.  He has a chronic occlusion of the SVG to his RCA and a patent LIMA to LAD.  Other issues include cardiomyopathy, his last ejection fraction was 35 to 40% February 2020.  He had a Medtronic CRT-D device implanted in 2014.  He has chronic atrial fibrillation and is on chronic anticoagulation.  He was hospitalized earlier this year with heart failure.  He also had anemia which required transfusion ultimately AVMs were discovered.  He is deaf and requires an interpreter.  He was contacted today for routine follow-up.  He had seen Dr. Sallyanne Kuster in August.  I spoke with  his wife on the phone who interpreted for [place].  She says he done well since we saw him last, he has had no significant chest pain or shortness of breath.  He is careful with his medications and watches his diet.  The patient does not have symptoms concerning for COVID-19 infection (fever, chills, cough, or new shortness of breath).    Past Medical History:  Diagnosis Date  . AICD (automatic cardioverter/defibrillator) present   . Biventricular ICD (implantable cardioverter-defibrillator) in place 03/24/2005   Implantation of a Medtronic Adapta ADDRO1, serial number [id number]  . CHF (congestive heart failure) ([place])   . CKD (chronic kidney disease), stage III   . Coronary artery disease    a. s/p CABG 1986. b. Multiple PCIs/caths. c. 09/2013: s/p PTCA and BMS to SVG-OM.  [name]   . Dysrhythmia   . History of abdominal aortic aneurysm   . History of bleeding peptic ulcer 1980  . History of epididymitis 2013  . HTN (hypertension)   . Hydronephrosis with ureteropelvic junction obstruction   . Hydroureter on left 2009  . Hypertension   . Ischemic cardiomyopathy    a. Prior EF 30-35%, s/p BIV-ICD. b. 09/2013: EF 45-50%.  . Moderate tricuspid regurgitation   . PAF (paroxysmal atrial fibrillation) ([place])   . Presence of permanent cardiac pacemaker   . Prostate cancer ([place])   . Status post coronary artery bypass grafting 1986   LIMA to the LAD, SVG to OM, SVG to RCA  . Testicular swelling  Past Surgical History:  Procedure Laterality Date  . 2-D echocardiogram  11/20/2011   Ejection fraction 30-35% moderate concentric left ventricular hypertrophy. Left atrium is moderately dilated. Mild MR. Mild or  . BI-VENTRICULAR IMPLANTABLE CARDIOVERTER DEFIBRILLATOR N/A 12/16/2012   Procedure: BI-VENTRICULAR IMPLANTABLE CARDIOVERTER DEFIBRILLATOR  (CRT-D);  Surgeon: [name], MD;  Location: [facility] CATH LAB;  Service: Cardiovascular;  Laterality: N/A;  . CARDIAC CATHETERIZATION  12/10/2011   SVG to  OM widely patent.  LIMA to LAD patent  . CATARACT EXTRACTION W/PHACO Right 10/12/2017   Procedure: CATARACT EXTRACTION PHACO AND INTRAOCULAR LENS PLACEMENT (IOC);  Surgeon: Birder Robson, MD;  Location: [facility] ORS;  Service: Ophthalmology;  Laterality: Right;  [place] 00:57 AP% 15.9 CDE 9.07 Fluid pack lot # [id number]  . COLONOSCOPY N/A 07/13/2018   Procedure: COLONOSCOPY;  Surgeon: Toledo, Benay Pike, MD;  Location: [facility] ENDOSCOPY;  Service: Gastroenterology;  Laterality: N/A;  . CORONARY ARTERY BYPASS GRAFT  1986  . ENTEROSCOPY N/A 09/14/2018   Procedure: ENTEROSCOPY;  Surgeon: Toledo, Benay Pike, MD;  Location: [facility] ENDOSCOPY;  Service: Gastroenterology;  Laterality: N/A;  symptomatic anemia, GI blood loss anemia, melena, positive small bowel capsule endoscopy showing source of bleeding   . ESOPHAGOGASTRODUODENOSCOPY N/A 07/13/2018   Procedure: ESOPHAGOGASTRODUODENOSCOPY (EGD);  Surgeon: Toledo, Benay Pike, MD;  Location: [facility] ENDOSCOPY;  Service: Gastroenterology;  Laterality: N/A;  . ESOPHAGOGASTRODUODENOSCOPY N/A 09/14/2018   Procedure: ESOPHAGOGASTRODUODENOSCOPY (EGD);  Surgeon: Toledo, Benay Pike, MD;  Location: [facility] ENDOSCOPY;  Service: Gastroenterology;  Laterality: N/A;  SIGN LANAGUAGE INTERPRETER  . ESOPHAGOGASTRODUODENOSCOPY (EGD) WITH PROPOFOL N/A 05/27/2018   Procedure: ESOPHAGOGASTRODUODENOSCOPY (EGD) WITH PROPOFOL;  Surgeon: Clarene Essex, MD;  Location: [place];  Service: Endoscopy;  Laterality: N/A;  . INSERT / REPLACE / REMOVE PACEMAKER    . LEFT HEART CATHETERIZATION WITH CORONARY/GRAFT ANGIOGRAM N/A 12/10/2011   Procedure: LEFT HEART CATHETERIZATION WITH Beatrix Fetters;  Surgeon: Sanda Klein, MD;  Location: [facility] CATH LAB;  Service: Cardiovascular;  Laterality: N/A;  . LEFT HEART CATHETERIZATION WITH CORONARY/GRAFT ANGIOGRAM N/A 09/25/2013   Procedure: LEFT HEART CATHETERIZATION WITH Beatrix Fetters;  Surgeon: Blane Ohara, MD;  Location: [facility] CATH LAB;  Service:  Cardiovascular;  Laterality: N/A;  . Persantine Myoview  05/06/2010   Post-rest ejection fraction 30%. No significant ischemia demonstrated. Compared to previous study there is no significant change.  . TRANSURETHRAL RESECTION OF PROSTATE     s/p     Current Meds  Medication Sig  . albuterol (VENTOLIN HFA) 108 (90 Base) MCG/ACT inhaler Inhale 2 puffs into the lungs 4 (four) times daily as needed. (Patient taking differently: Inhale 2 puffs into the lungs 4 (four) times daily as needed for wheezing or shortness of breath. )  . carvedilol (COREG) 3.125 MG tablet Take 1 tablet (3.125 mg total) by mouth 2 (two) times daily with a meal.  . ferrous sulfate 325 (65 FE) MG tablet Take 1 tablet (325 mg total) by mouth daily for 30 days.  . furosemide (LASIX) 40 MG tablet Take 2 tablets (80 mg total) by mouth daily.  [name] HYDROCORTISONE, TOPICAL, 2 % LOTN Apply 40 oz topically 2 (two) times daily as needed (as needed for itching on foot). APPLY BID TO AFFECTED AREA  . isosorbide mononitrate (IMDUR) 30 MG 24 hr tablet Take 1 tablet (30 mg total) by mouth daily.  [name] losartan (COZAAR) 25 MG tablet Take 1 tablet (25 mg total) by mouth daily.  . pantoprazole (PROTONIX) 40 MG tablet Take 1 tablet (40 mg total) by mouth  2 (two) times daily before a meal. (Patient taking differently: Take 40 mg by mouth daily. )  . simvastatin (ZOCOR) 20 MG tablet Take 1 tablet (20 mg total) by mouth daily.  . vitamin B-12 (CYANOCOBALAMIN) 500 MCG tablet Take 1 tablet (500 mcg total) by mouth daily.     Allergies:   Phenazopyridine, Entresto [sacubitril-valsartan], and Ramipril   Social History   Tobacco Use  . Smoking status: Former Smoker    Quit date: 03/15/1985    Years since quitting: 33.9  . Smokeless tobacco: Never Used  Substance Use Topics  . Alcohol use: No    Comment: occas.  . Drug use: No     Family Hx: The patient's family history includes Hypertension in his father.  ROS:   Please see the history of  present illness.    All other systems reviewed and are negative.   Prior CV studies:   The following studies were reviewed today:  Echo Feb 2020  Labs/Other Tests and Data Reviewed:    EKG:  No ECG reviewed.  Recent Labs: 08/11/2018: B Natriuretic Peptide 316.9 09/13/2018: ALT 14; TSH 4.939 09/15/2018: Magnesium 2.2 10/11/2018: BUN 35; Creatinine, Ser 1.20; Hemoglobin 8.2; Platelets 233; Potassium 4.3; Sodium 142   Recent Lipid Panel Lab Results  Component Value Date/Time   CHOL 92 11/01/2015 03:31 AM   CHOL 139 11/20/2011 06:24 AM   TRIG 76 11/01/2015 03:31 AM   TRIG 108 11/20/2011 06:24 AM   HDL 31 (L) 11/01/2015 03:31 AM   HDL 26 (L) 11/20/2011 06:24 AM   CHOLHDL 3.0 11/01/2015 03:31 AM   LDLCALC 46 11/01/2015 03:31 AM   LDLCALC 91 11/20/2011 06:24 AM    Wt Readings from Last 3 Encounters:  01/30/19 227 lb (103 kg)  11/07/18 239 lb 3.2 oz (108.5 kg)  10/27/18 240 lb 9.6 oz (109.1 kg)     Objective:    Vital Signs:  BP 132/62   Pulse 89   Ht 6\' 2"  (1.88 m)   Wt 227 lb (103 kg)   BMI 29.15 kg/m    VITAL SIGNS:  reviewed  ASSESSMENT & PLAN:    CAD S/P multiple PCIs Multiple PCIs/caths - last PCI 09/2013- BMS to SVG-OM. Residual CTO of SVG-RCA, patent LIMA  Cardiomyopathy, ischemic EF 35-40% by echo Feb 2020  Biventricular ICD in place Medtronic CRT-D 2014   Permanent atrial fibrillation (HCC) CAF on last pacemaker check  Occult GI bleeding AVMs documented, he has required transfusion in the past  CRI (chronic renal insufficiency), stage 3 (moderate) GFR 56  Bilateral deafness Spoke with his wife who translated for [place]  COVID-19 Education: The signs and symptoms of COVID-19 were discussed with the patient and how to seek care for testing (follow up with PCP or arrange E-visit).  The importance of social distancing was discussed today.  Time:   Today, I have spent 20 minutes with the patient with telehealth technology discussing the above problems.      Medication Adjustments/Labs and Tests Ordered: Current medicines are reviewed at length with the patient today.  Concerns regarding medicines are outlined above.   Tests Ordered: No orders of the defined types were placed in this encounter.   Medication Changes: No orders of the defined types were placed in this encounter.   Follow Up:  In Person Dr Sallyanne Kuster in May 2021  Signed, Kerin Ransom, [place]  01/30/2019 11:29 AM    [place]

## 2019-01-30 NOTE — Telephone Encounter (Signed)

## 2019-01-30 NOTE — Assessment & Plan Note (Signed)
CAF on last pacemaker check

## 2019-01-30 NOTE — Assessment & Plan Note (Signed)
GFR 56

## 2019-01-30 NOTE — Assessment & Plan Note (Signed)
Spoke with his wife who translated for [place]

## 2019-01-30 NOTE — Assessment & Plan Note (Signed)
Medtronic CRT-D 2014

## 2019-01-30 NOTE — Assessment & Plan Note (Signed)
Multiple PCIs/caths - last PCI 09/2013- BMS to SVG-OM. Residual CTO of SVG-RCA, patent LIMA

## 2019-01-30 NOTE — Patient Instructions (Signed)
Medication Instructions:  Your physician recommends that you continue on your current medications as directed. Please refer to the Current Medication list given to you today. *If you need a refill on your cardiac medications before your next appointment, please call your pharmacy*  Lab Work: None  If you have labs (blood work) drawn today and your tests are completely normal, you will receive your results only by: . MyChart Message (if you have MyChart) OR . A paper copy in the mail If you have any lab test that is abnormal or we need to change your treatment, we will call you to review the results.  Testing/Procedures: None   Follow-Up: At [facility], you and your health needs are our priority.  As part of our continuing mission to provide you with exceptional heart care, we have created designated Provider Care Teams.  These Care Teams include your primary Cardiologist (physician) and Advanced Practice Providers (APPs -  Physician Assistants and Nurse Practitioners) who all work together to provide you with the care you need, when you need it.  Your next appointment:   6 month(s)  The format for your next appointment:   In Person  Provider:   Mihai Croitoru, MD  Other Instructions  

## 2019-01-30 NOTE — Assessment & Plan Note (Signed)
EF 35-40% by echo Feb 2020

## 2019-01-30 NOTE — Telephone Encounter (Signed)
Left detailed message on patients voicemail to discuss AVS instructions. Left patient Thomas [name]'s recommendations from today's virtual office visit. Informed patient to follow up in 6 months with Thomas [name]. Advised patient to contact office with any questions or concerns. Patient voiced understanding and AVS mailed.

## 2019-02-06 ENCOUNTER — Ambulatory Visit (INDEPENDENT_AMBULATORY_CARE_PROVIDER_SITE_OTHER): Payer: PPO | Admitting: *Deleted

## 2019-02-06 DIAGNOSIS — I5043 Acute on chronic combined systolic (congestive) and diastolic (congestive) heart failure: Secondary | ICD-10-CM | POA: Diagnosis not present

## 2019-02-06 LAB — CUP PACEART REMOTE DEVICE CHECK
Battery Remaining Longevity: 6 mo
Battery Voltage: 2.82 V
Brady Statistic AP VP Percent: 0 %
Brady Statistic AP VS Percent: 0 %
Brady Statistic AS VP Percent: 98.44 %
Brady Statistic AS VS Percent: 1.56 %
Brady Statistic RA Percent Paced: 0 %
Brady Statistic RV Percent Paced: 98.23 %
Date Time Interrogation Session: 20201123001604
HighPow Impedance: 43 Ohm
Implantable Lead Implant Date: 20000210
Implantable Lead Implant Date: 20000210
Implantable Lead Implant Date: 20141003
Implantable Lead Location: 753858
Implantable Lead Location: 753859
Implantable Lead Location: 753860
Implantable Lead Model: 4244
Implantable Lead Model: 4285
Implantable Lead Model: 4298
Implantable Lead Serial Number: 272469
Implantable Lead Serial Number: 413633
Implantable Pulse Generator Implant Date: 20141003
Lead Channel Impedance Value: 228 Ohm
Lead Channel Impedance Value: 285 Ohm
Lead Channel Impedance Value: 323 Ohm
Lead Channel Impedance Value: 323 Ohm
Lead Channel Impedance Value: 323 Ohm
Lead Channel Impedance Value: 323 Ohm
Lead Channel Impedance Value: 399 Ohm
Lead Channel Impedance Value: 437 Ohm
Lead Channel Impedance Value: 513 Ohm
Lead Channel Impedance Value: 513 Ohm
Lead Channel Impedance Value: 551 Ohm
Lead Channel Impedance Value: 551 Ohm
Lead Channel Impedance Value: 551 Ohm
Lead Channel Pacing Threshold Amplitude: 0.75 V
Lead Channel Pacing Threshold Amplitude: 1.625 V
Lead Channel Pacing Threshold Pulse Width: 0.4 ms
Lead Channel Pacing Threshold Pulse Width: 0.6 ms
Lead Channel Sensing Intrinsic Amplitude: 0.875 mV
Lead Channel Sensing Intrinsic Amplitude: 0.875 mV
Lead Channel Sensing Intrinsic Amplitude: 9.375 mV
Lead Channel Sensing Intrinsic Amplitude: 9.375 mV
Lead Channel Setting Pacing Amplitude: 2 V
Lead Channel Setting Pacing Amplitude: 2.75 V
Lead Channel Setting Pacing Pulse Width: 0.4 ms
Lead Channel Setting Pacing Pulse Width: 0.6 ms
Lead Channel Setting Sensing Sensitivity: 0.3 mV

## 2019-02-27 ENCOUNTER — Telehealth: Payer: Self-pay | Admitting: Cardiovascular Disease

## 2019-02-27 DIAGNOSIS — D649 Anemia, unspecified: Secondary | ICD-10-CM | POA: Diagnosis not present

## 2019-02-27 DIAGNOSIS — J019 Acute sinusitis, unspecified: Secondary | ICD-10-CM | POA: Diagnosis not present

## 2019-02-27 DIAGNOSIS — B9689 Other specified bacterial agents as the cause of diseases classified elsewhere: Secondary | ICD-10-CM | POA: Diagnosis not present

## 2019-02-27 DIAGNOSIS — Z03818 Encounter for observation for suspected exposure to other biological agents ruled out: Secondary | ICD-10-CM | POA: Diagnosis not present

## 2019-02-27 DIAGNOSIS — L97929 Non-pressure chronic ulcer of unspecified part of left lower leg with unspecified severity: Secondary | ICD-10-CM | POA: Diagnosis not present

## 2019-02-27 DIAGNOSIS — R600 Localized edema: Secondary | ICD-10-CM | POA: Diagnosis not present

## 2019-02-27 DIAGNOSIS — I83029 Varicose veins of left lower extremity with ulcer of unspecified site: Secondary | ICD-10-CM | POA: Diagnosis not present

## 2019-02-27 NOTE — Telephone Encounter (Signed)
Pt has called stating that he is experiencing chills all over his body and that his body is really cold. Blood pressure last checked was 134/65 with normal heart rate. Want to talk to Dr. Regarding symptoms. Pt has interpreter on line with him  Please call to discuss.

## 2019-02-27 NOTE — Telephone Encounter (Signed)
Pts wife called to report the pt has been having chills, cold hands and feet, clammy, weak.. no SOB, cough, chest pain.... no fever as far as she can tell... he did have cough last week but it has subsided.. he has not been exposed to anyone that has been sick recently.. I have advised them to call his PCP Dr. Kary Kos.. they report they have left a message already this morning at his office.   I also advised to continue to monitor his temp.. his BP now is 134/65. To call Dr. Kary Kos back if they do not hear from him soon.

## 2019-03-08 ENCOUNTER — Telehealth: Payer: Self-pay | Admitting: Cardiovascular Disease

## 2019-03-08 MED ORDER — METOLAZONE 2.5 MG PO TABS: ORAL_TABLET | ORAL | 0 refills | Status: DC

## 2019-03-08 NOTE — Telephone Encounter (Signed)
Spoke with pt with assistance of sign language interpreter.  Pt states he has had edema to LLE 'on and off' for about 1 yr. No pain, warmth, or discoloration to LLE in comparison to RLE. Pt confirms that he is on Lasix 80 mg daily. No wt gain. Elevates legs to help with edema. Per pt wife, pt does not like to wear his compression stockings and they are difficult to put on. Denies indiscretion with salt. Reviewed salty six. States he eats pizza and canned foods once in a while. Informed pt that nurse will consult Dr. C to advise and then call back. Pt states he will be leaving soon and can leave message or call back tomorrow.   Consulted Dr. Loletha Grayer. Recommendation is that pt take one dose of metolazone 2.5 mg tomorrow morning 30 min before taking his Lasix, triage nurse send Rx for 6 tabs to pharmacy for pick up with instructions that pt only take when instructed by physician, and pt to be scheduled for appt after first of Jan 2021. Rx sent to pt pharmacy    Contacted pt to advise. LMTCB

## 2019-03-08 NOTE — Telephone Encounter (Signed)
New message   Pt c/o swelling: STAT is pt has developed SOB within 24 hours  1) How much weight have you gained and in what time span? No   2) If swelling, where is the swelling located?left leg   3) Are you currently taking a fluid pill? Yes   4) Are you currently SOB? No   5) Do you have a log of your daily weights (if so, list)?no   6) Have you gained 3 pounds in a day or 5 pounds in a week?no   7) Have you traveled recently? No

## 2019-03-09 ENCOUNTER — Ambulatory Visit (INDEPENDENT_AMBULATORY_CARE_PROVIDER_SITE_OTHER): Payer: PPO | Admitting: *Deleted

## 2019-03-09 DIAGNOSIS — I441 Atrioventricular block, second degree: Secondary | ICD-10-CM

## 2019-03-12 LAB — CUP PACEART REMOTE DEVICE CHECK
Battery Remaining Longevity: 5 mo
Battery Voltage: 2.82 V
Brady Statistic AP VP Percent: 0 %
Brady Statistic AP VS Percent: 0 %
Brady Statistic AS VP Percent: 96.75 %
Brady Statistic AS VS Percent: 3.25 %
Brady Statistic RA Percent Paced: 0 %
Brady Statistic RV Percent Paced: 96.89 %
Date Time Interrogation Session: 20201224012302
HighPow Impedance: 65 Ohm
Implantable Lead Implant Date: 20000210
Implantable Lead Implant Date: 20000210
Implantable Lead Implant Date: 20141003
Implantable Lead Location: 753858
Implantable Lead Location: 753859
Implantable Lead Location: 753860
Implantable Lead Model: 4244
Implantable Lead Model: 4285
Implantable Lead Model: 4298
Implantable Lead Serial Number: 272469
Implantable Lead Serial Number: 413633
Implantable Pulse Generator Implant Date: 20141003
Lead Channel Impedance Value: 228 Ohm
Lead Channel Impedance Value: 285 Ohm
Lead Channel Impedance Value: 323 Ohm
Lead Channel Impedance Value: 323 Ohm
Lead Channel Impedance Value: 323 Ohm
Lead Channel Impedance Value: 342 Ohm
Lead Channel Impedance Value: 399 Ohm
Lead Channel Impedance Value: 437 Ohm
Lead Channel Impedance Value: 513 Ohm
Lead Channel Impedance Value: 513 Ohm
Lead Channel Impedance Value: 551 Ohm
Lead Channel Impedance Value: 551 Ohm
Lead Channel Impedance Value: 570 Ohm
Lead Channel Pacing Threshold Amplitude: 0.625 V
Lead Channel Pacing Threshold Amplitude: 1.75 V
Lead Channel Pacing Threshold Pulse Width: 0.4 ms
Lead Channel Pacing Threshold Pulse Width: 0.6 ms
Lead Channel Sensing Intrinsic Amplitude: 0.875 mV
Lead Channel Sensing Intrinsic Amplitude: 0.875 mV
Lead Channel Sensing Intrinsic Amplitude: 9.375 mV
Lead Channel Sensing Intrinsic Amplitude: 9.375 mV
Lead Channel Setting Pacing Amplitude: 2 V
Lead Channel Setting Pacing Amplitude: 2.75 V
Lead Channel Setting Pacing Pulse Width: 0.4 ms
Lead Channel Setting Pacing Pulse Width: 0.6 ms
Lead Channel Setting Sensing Sensitivity: 0.3 mV

## 2019-03-12 NOTE — Progress Notes (Signed)
1 month battery check

## 2019-03-27 DIAGNOSIS — R05 Cough: Secondary | ICD-10-CM | POA: Diagnosis not present

## 2019-03-27 DIAGNOSIS — R112 Nausea with vomiting, unspecified: Secondary | ICD-10-CM | POA: Diagnosis not present

## 2019-03-27 DIAGNOSIS — J22 Unspecified acute lower respiratory infection: Secondary | ICD-10-CM | POA: Diagnosis not present

## 2019-03-27 DIAGNOSIS — J029 Acute pharyngitis, unspecified: Secondary | ICD-10-CM | POA: Diagnosis not present

## 2019-03-27 DIAGNOSIS — R509 Fever, unspecified: Secondary | ICD-10-CM | POA: Diagnosis not present

## 2019-03-27 NOTE — Telephone Encounter (Signed)
Follow Up  Spoke with sign language interpreter, no answer from patient, lmtcb and scheduled visit.

## 2019-04-07 ENCOUNTER — Other Ambulatory Visit: Payer: Self-pay | Admitting: Adult Health

## 2019-04-10 ENCOUNTER — Ambulatory Visit (INDEPENDENT_AMBULATORY_CARE_PROVIDER_SITE_OTHER): Payer: PPO | Admitting: *Deleted

## 2019-04-10 DIAGNOSIS — I441 Atrioventricular block, second degree: Secondary | ICD-10-CM | POA: Diagnosis not present

## 2019-04-11 LAB — CUP PACEART REMOTE DEVICE CHECK
Battery Remaining Longevity: 4 mo
Battery Voltage: 2.8 V
Brady Statistic AP VP Percent: 0 %
Brady Statistic AP VS Percent: 0 %
Brady Statistic AS VP Percent: 96.95 %
Brady Statistic AS VS Percent: 3.05 %
Brady Statistic RA Percent Paced: 0 %
Brady Statistic RV Percent Paced: 97.79 %
Date Time Interrogation Session: 20210126003525
HighPow Impedance: 62 Ohm
Implantable Lead Implant Date: 20000210
Implantable Lead Implant Date: 20000210
Implantable Lead Implant Date: 20141003
Implantable Lead Location: 753858
Implantable Lead Location: 753859
Implantable Lead Location: 753860
Implantable Lead Model: 4244
Implantable Lead Model: 4285
Implantable Lead Model: 4298
Implantable Lead Serial Number: 272469
Implantable Lead Serial Number: 413633
Implantable Pulse Generator Implant Date: 20141003
Lead Channel Impedance Value: 266 Ohm
Lead Channel Impedance Value: 266 Ohm
Lead Channel Impedance Value: 285 Ohm
Lead Channel Impedance Value: 323 Ohm
Lead Channel Impedance Value: 323 Ohm
Lead Channel Impedance Value: 323 Ohm
Lead Channel Impedance Value: 399 Ohm
Lead Channel Impedance Value: 399 Ohm
Lead Channel Impedance Value: 494 Ohm
Lead Channel Impedance Value: 513 Ohm
Lead Channel Impedance Value: 513 Ohm
Lead Channel Impedance Value: 513 Ohm
Lead Channel Impedance Value: 513 Ohm
Lead Channel Pacing Threshold Amplitude: 0.75 V
Lead Channel Pacing Threshold Amplitude: 1.75 V
Lead Channel Pacing Threshold Pulse Width: 0.4 ms
Lead Channel Pacing Threshold Pulse Width: 0.6 ms
Lead Channel Sensing Intrinsic Amplitude: 0.875 mV
Lead Channel Sensing Intrinsic Amplitude: 0.875 mV
Lead Channel Sensing Intrinsic Amplitude: 3.25 mV
Lead Channel Sensing Intrinsic Amplitude: 3.25 mV
Lead Channel Setting Pacing Amplitude: 2 V
Lead Channel Setting Pacing Amplitude: 2.75 V
Lead Channel Setting Pacing Pulse Width: 0.4 ms
Lead Channel Setting Pacing Pulse Width: 0.6 ms
Lead Channel Setting Sensing Sensitivity: 0.3 mV

## 2019-04-14 ENCOUNTER — Telehealth: Payer: Self-pay | Admitting: Cardiovascular Disease

## 2019-04-14 NOTE — Telephone Encounter (Signed)
We are recommending the COVID-19 vaccine to all of our patients. Cardiac medications (including blood thinners) should not deter anyone from being vaccinated and there is no need to hold any of those medications prior to vaccine administration.     Currently, there is a hotline to call (active 03/24/19) to schedule vaccination appointments as no walk-ins will be accepted.   Number: [phone].    If an appointment is not available please go to [email or web address] to sign up for notification when additional vaccine appointments are available.   If you have further questions or concerns about the vaccine process, please visit [email or web address] or contact your primary care physician.   I have informed patient of instructions.   

## 2019-04-17 DIAGNOSIS — L821 Other seborrheic keratosis: Secondary | ICD-10-CM | POA: Diagnosis not present

## 2019-04-17 DIAGNOSIS — I872 Venous insufficiency (chronic) (peripheral): Secondary | ICD-10-CM | POA: Diagnosis not present

## 2019-04-17 DIAGNOSIS — Z85828 Personal history of other malignant neoplasm of skin: Secondary | ICD-10-CM | POA: Diagnosis not present

## 2019-04-17 DIAGNOSIS — D2239 Melanocytic nevi of other parts of face: Secondary | ICD-10-CM | POA: Diagnosis not present

## 2019-04-17 DIAGNOSIS — L97819 Non-pressure chronic ulcer of other part of right lower leg with unspecified severity: Secondary | ICD-10-CM | POA: Diagnosis not present

## 2019-04-17 DIAGNOSIS — R600 Localized edema: Secondary | ICD-10-CM | POA: Diagnosis not present

## 2019-04-17 DIAGNOSIS — L720 Epidermal cyst: Secondary | ICD-10-CM | POA: Diagnosis not present

## 2019-04-17 DIAGNOSIS — L578 Other skin changes due to chronic exposure to nonionizing radiation: Secondary | ICD-10-CM | POA: Diagnosis not present

## 2019-04-17 NOTE — Progress Notes (Deleted)
Cardiology Office Note    Date:  04/17/2019   ID:  Anzar, Barnett 1934/07/20, MRN [id number]  PCP:  [name], MD  Cardiologist:  Cristopher Peru, MD (EP); Sanda Klein, MD   Chief Complaint  Patient presents with  . Follow-up CHF    pt reports no complaints    History of Present Illness:  Thomas [name] is a 85 y.o. male with chronic systolic heart failure related to ischemic cardiomyopathy, previous coronary bypass surgery and previous stent in SVG-OM for acute non-STEMI in 2015, long-term persistent atrial fibrillation, history of second degree Mobitz type II AV block, CRT-D Medtronic device implanted 2014.  As always, we need the services of a sign language interpreter.   Hwas doing well at the January 30, 2019 telemedicine visit (227 lb that day). No ER visits or hospitalizations since then. No recent overt GI bleeding, but Hgb still low at 8.1 (microcytic) on 03/27/2019 labs.  *** He has recently had problems with GI bleeding, requiring 2u PRBC transfusions. Two areas of angioectasia were treated in the stomach and jejunum. Most recent Hgb on 7/28 was still low at 8.2 (stable from 07/03).  He was seen in [facility] ED on 07/28 for dyspnea and chest pain. Troponin was normal. CXR showed mild CHF with an increased L pleural effusion.  I do not think he was given extra diuretics.  The patient specifically denies any chest pain at rest exertion, dyspnea at rest or with exertion, orthopnea, paroxysmal nocturnal dyspnea, syncope, palpitations, focal neurological deficits, intermittent claudication, lower extremity edema, unexplained weight gain, cough, hemoptysis or wheezing.  Despite his lack of subjective complaints, he appears mildly tachypneic, has evident JVD 8-9 cm and tight bilateral calf edema to the knees.  His weight is substantially up compared to baseline dry weight of approximately 225 lb (and he may have lost some true weight since then with all his GI issues).  ECG  on 10/11/2018 showed atrial fibrillation with 100% V pacing.  Last device interrogation in clinic showed 97.7% BiV pacing and escape rhythm at 30-40 bpm.   He had bypass surgery in 1995. Left ventricle systolic function deteriorated in 2014 without options for revascularization by cardiac catheterization and he underwent implantation of his CRT-D in October 2014. He had substantial clinical improvement and left ventricular EF also improved to 45-50%. In July 2015 he had an acute non-STEMI with placement of a stent to the SVG to OM (bare-metal 3.512 mm MultiLink vision). Native coronary arteries were occluded, LIMA to LAD was patent with a 50-70% stenosis at the anastomosis and chronic total occlusion of SVG-RCA.  Past Medical History:  Diagnosis Date  . AICD (automatic cardioverter/defibrillator) present   . Biventricular ICD (implantable cardioverter-defibrillator) in place 03/24/2005   Implantation of a Medtronic Adapta ADDRO1, serial number [id number]  . CHF (congestive heart failure) ([place])   . CKD (chronic kidney disease), stage III   . Coronary artery disease    a. s/p CABG 1986. b. Multiple PCIs/caths. c. 09/2013: s/p PTCA and BMS to SVG-OM.  [name]   . Dysrhythmia   . History of abdominal aortic aneurysm   . History of bleeding peptic ulcer 1980  . History of epididymitis 2013  . HTN (hypertension)   . Hydronephrosis with ureteropelvic junction obstruction   . Hydroureter on left 2009  . Hypertension   . Ischemic cardiomyopathy    a. Prior EF 30-35%, s/p BIV-ICD. b. 09/2013: EF 45-50%.  . Moderate tricuspid regurgitation   .  PAF (paroxysmal atrial fibrillation) ([place])   . Presence of permanent cardiac pacemaker   . Prostate cancer ([place])   . Status post coronary artery bypass grafting 1986   LIMA to the LAD, SVG to OM, SVG to RCA  . Testicular swelling     Past Surgical History:  Procedure Laterality Date  . 2-D echocardiogram  11/20/2011   Ejection fraction 30-35% moderate  concentric left ventricular hypertrophy. Left atrium is moderately dilated. Mild MR. Mild or  . BI-VENTRICULAR IMPLANTABLE CARDIOVERTER DEFIBRILLATOR N/A 12/16/2012   Procedure: BI-VENTRICULAR IMPLANTABLE CARDIOVERTER DEFIBRILLATOR  (CRT-D);  Surgeon: [name], MD;  Location: [facility] C[facility] LAB;  Service: Cardiovascular;  Laterality: N/A;  . CARDIAC CATHETERIZATION  12/10/2011   SVG to OM widely patent.  LIMA to LAD patent  . CATARACT EXTRACTION W/PHACO Right 10/12/2017   Procedure: CATARACT EXTRACTION PHACO AND INTRAOCULAR LENS PLACEMENT (IOC);  Surgeon: Birder Robson, MD;  Location: [facility] ORS;  Service: Ophthalmology;  Laterality: Right;  [place] 00:57 AP% 15.9 CDE 9.07 Fluid pack lot # [id number]  . COLONOSCOPY N/A 07/13/2018   Procedure: COLONOSCOPY;  Surgeon: Toledo, Benay Pike, MD;  Location: [facility] ENDOSCOPY;  Service: Gastroenterology;  Laterality: N/A;  . CORONARY ARTERY BYPASS GRAFT  1986  . ENTEROSCOPY N/A 09/14/2018   Procedure: ENTEROSCOPY;  Surgeon: Toledo, Benay Pike, MD;  Location: [facility] ENDOSCOPY;  Service: Gastroenterology;  Laterality: N/A;  symptomatic anemia, GI blood loss anemia, melena, positive small bowel capsule endoscopy showing source of bleeding   . ESOPHAGOGASTRODUODENOSCOPY N/A 07/13/2018   Procedure: ESOPHAGOGASTRODUODENOSCOPY (EGD);  Surgeon: Toledo, Benay Pike, MD;  Location: [facility] ENDOSCOPY;  Service: Gastroenterology;  Laterality: N/A;  . ESOPHAGOGASTRODUODENOSCOPY N/A 09/14/2018   Procedure: ESOPHAGOGASTRODUODENOSCOPY (EGD);  Surgeon: Toledo, Benay Pike, MD;  Location: [facility] ENDOSCOPY;  Service: Gastroenterology;  Laterality: N/A;  SIGN LANAGUAGE INTERPRETER  . ESOPHAGOGASTRODUODENOSCOPY (EGD) WITH PROPOFOL N/A 05/27/2018   Procedure: ESOPHAGOGASTRODUODENOSCOPY (EGD) WITH PROPOFOL;  Surgeon: Clarene Essex, MD;  Location: [place];  Service: Endoscopy;  Laterality: N/A;  . INSERT / REPLACE / REMOVE PACEMAKER    . LEFT HEART CATHETERIZATION WITH CORONARY/GRAFT ANGIOGRAM N/A  12/10/2011   Procedure: LEFT HEART CATHETERIZATION WITH Beatrix Fetters;  Surgeon: Sanda Klein, MD;  Location: [place] CATH LAB;  Service: Cardiovascular;  Laterality: N/A;  . LEFT HEART CATHETERIZATION WITH CORONARY/GRAFT ANGIOGRAM N/A 09/25/2013   Procedure: LEFT HEART CATHETERIZATION WITH Beatrix Fetters;  Surgeon: Blane Ohara, MD;  Location: [facility] CATH LAB;  Service: Cardiovascular;  Laterality: N/A;  . Persantine Myoview  05/06/2010   Post-rest ejection fraction 30%. No significant ischemia demonstrated. Compared to previous study there is no significant change.  . TRANSURETHRAL RESECTION OF PROSTATE     s/p    Current Medications: Outpatient Medications Prior to Visit  Medication Sig Dispense Refill  . albuterol (VENTOLIN HFA) 108 (90 Base) MCG/ACT inhaler Inhale 2 puffs into the lungs 4 (four) times daily as needed. (Patient taking differently: Inhale 2 puffs into the lungs 4 (four) times daily as needed for wheezing or shortness of breath. ) 1 Inhaler 1  . carvedilol (COREG) 3.125 MG tablet Take 1 tablet (3.125 mg total) by mouth 2 (two) times daily with a meal. 60 tablet 0  . ferrous sulfate 325 (65 FE) MG tablet Take 1 tablet (325 mg total) by mouth daily for 30 days. 30 tablet 0  . furosemide (LASIX) 40 MG tablet Take 2 tablets (80 mg total) by mouth daily. 180 tablet 1  . HYDROCORTISONE, TOPICAL, 2 % LOTN Apply 40 oz topically  2 (two) times daily as needed (as needed for itching on foot). APPLY BID TO AFFECTED AREA 1 Bottle 3  . isosorbide mononitrate (IMDUR) 30 MG 24 hr tablet Take 1 tablet (30 mg total) by mouth daily. 30 tablet 0  . losartan (COZAAR) 25 MG tablet Take 1 tablet (25 mg total) by mouth daily. 90 tablet 3  . metolazone (ZAROXOLYN) 2.5 MG tablet Take 1 tablet (2.5 mg) by mouth on the morning of March 09, 2019. Take 30 minutes before taking Lasix. *ONLY TAKE WHEN INSTRUCTED BY CARDIOLOGIST TO DO SO* 6 tablet 0  . pantoprazole (PROTONIX) 40 MG tablet  Take 1 tablet (40 mg total) by mouth 2 (two) times daily before a meal. (Patient taking differently: Take 40 mg by mouth daily. ) 60 tablet 0  . simvastatin (ZOCOR) 20 MG tablet TAKE 1 TABLET BY MOUTH EVERY DAY 90 tablet 2  . vitamin B-12 (CYANOCOBALAMIN) 500 MCG tablet Take 1 tablet (500 mcg total) by mouth daily. 90 tablet 1   No facility-administered medications prior to visit.     Allergies:   Phenazopyridine, Entresto [sacubitril-valsartan], and Ramipril   Social History   Socioeconomic History  . Marital status: Married    Spouse name: Not on file  . Number of children: Not on file  . Years of education: Not on file  . Highest education level: Not on file  Occupational History  . Not on file  Tobacco Use  . Smoking status: Former Smoker    Quit date: 03/15/1985    Years since quitting: 34.1  . Smokeless tobacco: Never Used  Substance and Sexual Activity  . Alcohol use: No    Comment: occas.  . Drug use: No  . Sexual activity: Yes  Other Topics Concern  . Not on file  Social History Narrative  . Not on file   Social Determinants of Health   Financial Resource Strain: Low Risk   . Difficulty of Paying Living Expenses: Not hard at all  Food Insecurity: No Food Insecurity  . Worried About Charity fundraiser in the Last Year: Never true  . Ran Out of Food in the Last Year: Never true  Transportation Needs: No Transportation Needs  . Lack of Transportation (Medical): No  . Lack of Transportation (Non-Medical): No  Physical Activity: Inactive  . Days of Exercise per Week: 0 days  . Minutes of Exercise per Session: 0 min  Stress: No Stress Concern Present  . Feeling of Stress : Not at all  Social Connections: Slightly Isolated  . Frequency of Communication with Friends and Family: Never  . Frequency of Social Gatherings with Friends and Family: Never  . Attends Religious Services: 1 to 4 times per year  . Active Member of Clubs or Organizations: Yes  . Attends  Archivist Meetings: 1 to 4 times per year  . Marital Status: Married     Family History:  The patient's family history includes Hypertension in his father.   ROS:   Please see the history of present illness.    ROS All other systems are reviewed and are negative.  PHYSICAL EXAM:   VS:  There were no vitals taken for this visit.   *** General: Alert, oriented x3, no distress, smiling, pale Head: no evidence of trauma, PERRL, EOMI, no exophtalmos or lid lag, no myxedema, no xanthelasma; normal ears, nose and oropharynx Neck: 8-9 cm elevation in jugular venous pulsations and prompt hepatojugular reflux; brisk carotid pulses without delay  and no carotid bruits Chest: clear to auscultation, no signs of consolidation by percussion or palpation, normal fremitus, symmetrical and full respiratory excursions Cardiovascular: normal position and quality of the apical impulse, regular rhythm, normal first and paradoxically split second heart sounds, 3/6 mid precordial holosystolic murmur, no diastolic murmurs, rubs or gallops Abdomen: no tenderness or distention, no masses by palpation, no abnormal pulsatility or arterial bruits, normal bowel sounds, no hepatosplenomegaly Extremities: no clubbing, cyanosis; symmetrical 3+ calf edema to the knees; 2+ radial, ulnar and brachial pulses bilaterally; 2+ right femoral, posterior tibial and dorsalis pedis pulses; 2+ left femoral, posterior tibial and dorsalis pedis pulses; no subclavian or femoral bruits Neurological: grossly nonfocal Psych: Normal mood and affect   Wt Readings from Last 3 Encounters:  01/30/19 227 lb (103 kg)  11/07/18 239 lb 3.2 oz (108.5 kg)  10/27/18 240 lb 9.6 oz (109.1 kg)      Studies/Labs Reviewed:   EKG:  EKG is ordered today.  AFib, 100% V paced  Recent Labs: 08/11/2018: B Natriuretic Peptide 316.9 09/13/2018: ALT 14; TSH 4.939 09/15/2018: Magnesium 2.2 10/11/2018: BUN 35; Creatinine, Ser 1.20; Hemoglobin 8.2;  Platelets 233; Potassium 4.3; Sodium 142   Lipid Panel    Component Value Date/Time   CHOL 92 11/01/2015 0331   CHOL 139 11/20/2011 0624   TRIG 76 11/01/2015 0331   TRIG 108 11/20/2011 0624   HDL 31 (L) 11/01/2015 0331   HDL 26 (L) 11/20/2011 0624   CHOLHDL 3.0 11/01/2015 0331   VLDL 15 11/01/2015 0331   VLDL 22 11/20/2011 0624   LDLCALC 46 11/01/2015 0331   LDLCALC 91 11/20/2011 0624     ASSESSMENT:    No diagnosis found.  PLAN:  In order of problems listed above:  1. CHF: Clearly hypervolemic, likely CHF exacerbation due to anemia.  He always has some persistent edema in his legs, related to peripheral venous insufficiency. He is wearing compression stockings. Moderate ischemic cardiomyopathy, most recent estimated LVEF 45%. Reminded him about the need for sodium restriction and how to monitor his weight on a daily basis. Increased diuretic dose today to furosemide 80 mg daily and added KCl supplement. 2. Anemia: on Fe supplement, off anticoagulants, still Hgb 8 for last month. Add folic acid. 3. CAD: Suspect angina 07/28 was anemia and CHF related. He has not had angina pectoris since then. He is status post CABG and PCI of SVG to OM. Patent LIMA to LAD. Occluded native arteries and occluded SVG to RCA. He is on 2 antianginal medications: Beta blockers and long-acting nitrates 4. AFib: Never has RVR. CHADSVasc 7 (age 86, TIA 2, HTN, CAD, CHF). Temporarily off anticoagulation until his anemia shows improvemnt. 5. Eliquis anticoagulation. Has had bleeding due to gastric and jejunal angioectasia recently and is currently off anticoagulation.  6. High-grade AV block: Has underlying escape rhythm <40. He is not device dependent, but is 97% BiV paced. 7. CRT-D (Medtronic Viva quad XT)  With normal device function, due for a download August 15, next appointment with Dr. [name] in November.  8. Hyperlipidemia on statin. 9. HTN: excellent blood pressure control, no symptoms of hypotension.   10. TR: Loud TR murmur today. His echo in February 2020 shows  dilation of the right ventricle with mildly reduced right ventricular systolic function. Tricuspid regurgitation was described as being only mild. He has at least moderate TR based on clinical criteria. 10.  Congenital deafness - always needs a sign language interpreter  Medication Adjustments/Labs and Tests Ordered:  Current medicines are reviewed at length with the patient today.  Concerns regarding medicines are outlined above.  Medication changes, Labs and Tests ordered today are listed in the Patient Instructions below.  There are no Patient Instructions on file for this visit.  Signed, Sanda Klein, MD  04/17/2019 6:02 PM    [facility] [address], [place], [place]  [address] Phone: [phone]; Fax: [phone]

## 2019-04-18 DIAGNOSIS — L97929 Non-pressure chronic ulcer of unspecified part of left lower leg with unspecified severity: Secondary | ICD-10-CM | POA: Diagnosis not present

## 2019-04-24 ENCOUNTER — Encounter: Payer: PPO | Admitting: Cardiovascular Disease

## 2019-05-08 ENCOUNTER — Encounter: Payer: PPO | Admitting: Cardiovascular Disease

## 2019-05-11 ENCOUNTER — Ambulatory Visit (INDEPENDENT_AMBULATORY_CARE_PROVIDER_SITE_OTHER): Payer: PPO | Admitting: *Deleted

## 2019-05-11 DIAGNOSIS — I255 Ischemic cardiomyopathy: Secondary | ICD-10-CM | POA: Diagnosis not present

## 2019-05-12 LAB — CUP PACEART REMOTE DEVICE CHECK
Battery Remaining Longevity: 4 mo
Battery Voltage: 2.79 V
Brady Statistic AP VP Percent: 0 %
Brady Statistic AP VS Percent: 0 %
Brady Statistic AS VP Percent: 98.39 %
Brady Statistic AS VS Percent: 1.61 %
Brady Statistic RA Percent Paced: 0 %
Brady Statistic RV Percent Paced: 97.23 %
Date Time Interrogation Session: 20210226013626
HighPow Impedance: 57 Ohm
Implantable Lead Implant Date: 20000210
Implantable Lead Implant Date: 20000210
Implantable Lead Implant Date: 20141003
Implantable Lead Location: 753858
Implantable Lead Location: 753859
Implantable Lead Location: 753860
Implantable Lead Model: 4244
Implantable Lead Model: 4285
Implantable Lead Model: 4298
Implantable Lead Serial Number: 272469
Implantable Lead Serial Number: 413633
Implantable Pulse Generator Implant Date: 20141003
Lead Channel Impedance Value: 228 Ohm
Lead Channel Impedance Value: 285 Ohm
Lead Channel Impedance Value: 323 Ohm
Lead Channel Impedance Value: 323 Ohm
Lead Channel Impedance Value: 323 Ohm
Lead Channel Impedance Value: 323 Ohm
Lead Channel Impedance Value: 399 Ohm
Lead Channel Impedance Value: 437 Ohm
Lead Channel Impedance Value: 494 Ohm
Lead Channel Impedance Value: 513 Ohm
Lead Channel Impedance Value: 513 Ohm
Lead Channel Impedance Value: 513 Ohm
Lead Channel Impedance Value: 513 Ohm
Lead Channel Pacing Threshold Amplitude: 0.625 V
Lead Channel Pacing Threshold Amplitude: 1.625 V
Lead Channel Pacing Threshold Pulse Width: 0.4 ms
Lead Channel Pacing Threshold Pulse Width: 0.6 ms
Lead Channel Sensing Intrinsic Amplitude: 0.875 mV
Lead Channel Sensing Intrinsic Amplitude: 0.875 mV
Lead Channel Sensing Intrinsic Amplitude: 3.25 mV
Lead Channel Sensing Intrinsic Amplitude: 3.25 mV
Lead Channel Setting Pacing Amplitude: 2 V
Lead Channel Setting Pacing Amplitude: 2.75 V
Lead Channel Setting Pacing Pulse Width: 0.4 ms
Lead Channel Setting Pacing Pulse Width: 0.6 ms
Lead Channel Setting Sensing Sensitivity: 0.3 mV

## 2019-05-19 ENCOUNTER — Inpatient Hospital Stay
Admission: EM | Admit: 2019-05-19 | Discharge: 2019-05-30 | DRG: 291 | Disposition: A | Discharge status: Home Health | Payer: PPO | Attending: Internal Medicine | Admitting: Internal Medicine

## 2019-05-19 ENCOUNTER — Emergency Department: Payer: PPO

## 2019-05-19 ENCOUNTER — Encounter: Payer: Self-pay | Admitting: Emergency Medicine

## 2019-05-19 ENCOUNTER — Other Ambulatory Visit: Payer: Self-pay

## 2019-05-19 DIAGNOSIS — R188 Other ascites: Secondary | ICD-10-CM | POA: Diagnosis not present

## 2019-05-19 DIAGNOSIS — I5042 Chronic combined systolic (congestive) and diastolic (congestive) heart failure: Secondary | ICD-10-CM | POA: Diagnosis not present

## 2019-05-19 DIAGNOSIS — I25810 Atherosclerosis of coronary artery bypass graft(s) without angina pectoris: Secondary | ICD-10-CM | POA: Diagnosis not present

## 2019-05-19 DIAGNOSIS — K7460 Unspecified cirrhosis of liver: Secondary | ICD-10-CM

## 2019-05-19 DIAGNOSIS — R601 Generalized edema: Secondary | ICD-10-CM

## 2019-05-19 DIAGNOSIS — D631 Anemia in chronic kidney disease: Secondary | ICD-10-CM | POA: Diagnosis present

## 2019-05-19 DIAGNOSIS — R600 Localized edema: Secondary | ICD-10-CM | POA: Diagnosis not present

## 2019-05-19 DIAGNOSIS — Z955 Presence of coronary angioplasty implant and graft: Secondary | ICD-10-CM

## 2019-05-19 DIAGNOSIS — Z87891 Personal history of nicotine dependence: Secondary | ICD-10-CM | POA: Diagnosis not present

## 2019-05-19 DIAGNOSIS — K7290 Hepatic failure, unspecified without coma: Secondary | ICD-10-CM

## 2019-05-19 DIAGNOSIS — I2510 Atherosclerotic heart disease of native coronary artery without angina pectoris: Secondary | ICD-10-CM | POA: Diagnosis not present

## 2019-05-19 DIAGNOSIS — I255 Ischemic cardiomyopathy: Secondary | ICD-10-CM | POA: Diagnosis present

## 2019-05-19 DIAGNOSIS — N183 Chronic kidney disease, stage 3 unspecified: Secondary | ICD-10-CM | POA: Diagnosis not present

## 2019-05-19 DIAGNOSIS — Z951 Presence of aortocoronary bypass graft: Secondary | ICD-10-CM

## 2019-05-19 DIAGNOSIS — I959 Hypotension, unspecified: Secondary | ICD-10-CM | POA: Diagnosis present

## 2019-05-19 DIAGNOSIS — N1831 Chronic kidney disease, stage 3a: Secondary | ICD-10-CM | POA: Diagnosis present

## 2019-05-19 DIAGNOSIS — R0602 Shortness of breath: Secondary | ICD-10-CM | POA: Diagnosis present

## 2019-05-19 DIAGNOSIS — I252 Old myocardial infarction: Secondary | ICD-10-CM

## 2019-05-19 DIAGNOSIS — J9601 Acute respiratory failure with hypoxia: Secondary | ICD-10-CM | POA: Diagnosis present

## 2019-05-19 DIAGNOSIS — I441 Atrioventricular block, second degree: Secondary | ICD-10-CM | POA: Diagnosis not present

## 2019-05-19 DIAGNOSIS — I083 Combined rheumatic disorders of mitral, aortic and tricuspid valves: Secondary | ICD-10-CM | POA: Diagnosis present

## 2019-05-19 DIAGNOSIS — I340 Nonrheumatic mitral (valve) insufficiency: Secondary | ICD-10-CM | POA: Diagnosis not present

## 2019-05-19 DIAGNOSIS — Z95810 Presence of automatic (implantable) cardiac defibrillator: Secondary | ICD-10-CM

## 2019-05-19 DIAGNOSIS — Z8249 Family history of ischemic heart disease and other diseases of the circulatory system: Secondary | ICD-10-CM

## 2019-05-19 DIAGNOSIS — E785 Hyperlipidemia, unspecified: Secondary | ICD-10-CM | POA: Diagnosis present

## 2019-05-19 DIAGNOSIS — I5023 Acute on chronic systolic (congestive) heart failure: Secondary | ICD-10-CM | POA: Diagnosis not present

## 2019-05-19 DIAGNOSIS — Z8546 Personal history of malignant neoplasm of prostate: Secondary | ICD-10-CM | POA: Diagnosis not present

## 2019-05-19 DIAGNOSIS — Z8673 Personal history of transient ischemic attack (TIA), and cerebral infarction without residual deficits: Secondary | ICD-10-CM | POA: Diagnosis not present

## 2019-05-19 DIAGNOSIS — H9193 Unspecified hearing loss, bilateral: Secondary | ICD-10-CM | POA: Diagnosis present

## 2019-05-19 DIAGNOSIS — Z20822 Contact with and (suspected) exposure to covid-19: Secondary | ICD-10-CM | POA: Diagnosis not present

## 2019-05-19 DIAGNOSIS — I4821 Permanent atrial fibrillation: Secondary | ICD-10-CM | POA: Diagnosis not present

## 2019-05-19 DIAGNOSIS — M7989 Other specified soft tissue disorders: Secondary | ICD-10-CM

## 2019-05-19 DIAGNOSIS — I5022 Chronic systolic (congestive) heart failure: Secondary | ICD-10-CM | POA: Diagnosis present

## 2019-05-19 DIAGNOSIS — I248 Other forms of acute ischemic heart disease: Secondary | ICD-10-CM | POA: Diagnosis not present

## 2019-05-19 DIAGNOSIS — J189 Pneumonia, unspecified organism: Secondary | ICD-10-CM

## 2019-05-19 DIAGNOSIS — I130 Hypertensive heart and chronic kidney disease with heart failure and stage 1 through stage 4 chronic kidney disease, or unspecified chronic kidney disease: Principal | ICD-10-CM | POA: Diagnosis present

## 2019-05-19 DIAGNOSIS — Z8679 Personal history of other diseases of the circulatory system: Secondary | ICD-10-CM

## 2019-05-19 DIAGNOSIS — N179 Acute kidney failure, unspecified: Secondary | ICD-10-CM

## 2019-05-19 DIAGNOSIS — I5043 Acute on chronic combined systolic (congestive) and diastolic (congestive) heart failure: Secondary | ICD-10-CM

## 2019-05-19 DIAGNOSIS — I1310 Hypertensive heart and chronic kidney disease without heart failure, with stage 1 through stage 4 chronic kidney disease, or unspecified chronic kidney disease: Secondary | ICD-10-CM | POA: Diagnosis not present

## 2019-05-19 DIAGNOSIS — I361 Nonrheumatic tricuspid (valve) insufficiency: Secondary | ICD-10-CM | POA: Diagnosis not present

## 2019-05-19 DIAGNOSIS — R778 Other specified abnormalities of plasma proteins: Secondary | ICD-10-CM | POA: Diagnosis not present

## 2019-05-19 DIAGNOSIS — I10 Essential (primary) hypertension: Secondary | ICD-10-CM | POA: Diagnosis present

## 2019-05-19 DIAGNOSIS — N189 Chronic kidney disease, unspecified: Secondary | ICD-10-CM

## 2019-05-19 DIAGNOSIS — J9811 Atelectasis: Secondary | ICD-10-CM | POA: Diagnosis not present

## 2019-05-19 DIAGNOSIS — K7689 Other specified diseases of liver: Secondary | ICD-10-CM | POA: Diagnosis not present

## 2019-05-19 DIAGNOSIS — J90 Pleural effusion, not elsewhere classified: Secondary | ICD-10-CM | POA: Diagnosis not present

## 2019-05-19 DIAGNOSIS — Z8616 Personal history of COVID-19: Secondary | ICD-10-CM

## 2019-05-19 DIAGNOSIS — K5520 Angiodysplasia of colon without hemorrhage: Secondary | ICD-10-CM | POA: Diagnosis present

## 2019-05-19 DIAGNOSIS — Z9861 Coronary angioplasty status: Secondary | ICD-10-CM

## 2019-05-19 DIAGNOSIS — R0902 Hypoxemia: Secondary | ICD-10-CM

## 2019-05-19 DIAGNOSIS — K7469 Other cirrhosis of liver: Secondary | ICD-10-CM | POA: Diagnosis not present

## 2019-05-19 DIAGNOSIS — Z7901 Long term (current) use of anticoagulants: Secondary | ICD-10-CM

## 2019-05-19 DIAGNOSIS — I25708 Atherosclerosis of coronary artery bypass graft(s), unspecified, with other forms of angina pectoris: Secondary | ICD-10-CM | POA: Diagnosis not present

## 2019-05-19 DIAGNOSIS — Z888 Allergy status to other drugs, medicaments and biological substances status: Secondary | ICD-10-CM

## 2019-05-19 DIAGNOSIS — I272 Pulmonary hypertension, unspecified: Secondary | ICD-10-CM | POA: Diagnosis present

## 2019-05-19 DIAGNOSIS — R7989 Other specified abnormal findings of blood chemistry: Secondary | ICD-10-CM | POA: Diagnosis present

## 2019-05-19 LAB — CBC
HCT: 27.5 % — ABNORMAL LOW (ref 39.0–52.0)
Hemoglobin: 8.2 g/dL — ABNORMAL LOW (ref 13.0–17.0)
MCH: 23.1 pg — ABNORMAL LOW (ref 26.0–34.0)
MCHC: 29.8 g/dL — ABNORMAL LOW (ref 30.0–36.0)
MCV: 77.5 fL — ABNORMAL LOW (ref 80.0–100.0)
Platelets: 283 10*3/uL (ref 150–400)
RBC: 3.55 MIL/uL — ABNORMAL LOW (ref 4.22–5.81)
RDW: 21 % — ABNORMAL HIGH (ref 11.5–15.5)
WBC: 14.2 10*3/uL — ABNORMAL HIGH (ref 4.0–10.5)
nRBC: 0.1 % (ref 0.0–0.2)

## 2019-05-19 LAB — TROPONIN I (HIGH SENSITIVITY)
Troponin I (High Sensitivity): 266 ng/L (ref <18)
Troponin I (High Sensitivity): 332 ng/L (ref <18)
Troponin I (High Sensitivity): 390 ng/L (ref <18)

## 2019-05-19 LAB — PROTEIN / CREATININE RATIO, URINE
Creatinine, Urine: 73 mg/dL
Protein Creatinine Ratio: 0.14 mg/mg{Cre} (ref 0.00–0.15)
Total Protein, Urine: 10 mg/dL

## 2019-05-19 LAB — BASIC METABOLIC PANEL
Anion gap: 8 (ref 5–15)
BUN: 37 mg/dL — ABNORMAL HIGH (ref 8–23)
CO2: 26 mmol/L (ref 22–32)
Calcium: 8 mg/dL — ABNORMAL LOW (ref 8.9–10.3)
Chloride: 105 mmol/L (ref 98–111)
Creatinine, Ser: 1.73 mg/dL — ABNORMAL HIGH (ref 0.61–1.24)
GFR calc Af Amer: 42 mL/min — ABNORMAL LOW (ref >60)
GFR calc non Af Amer: 36 mL/min — ABNORMAL LOW (ref >60)
Glucose, Bld: 108 mg/dL — ABNORMAL HIGH (ref 70–99)
Potassium: 4.1 mmol/L (ref 3.5–5.1)
Sodium: 139 mmol/L (ref 135–145)

## 2019-05-19 LAB — CREATININE, SERUM
Creatinine, Ser: 1.98 mg/dL — ABNORMAL HIGH (ref 0.61–1.24)
GFR calc Af Amer: 35 mL/min — ABNORMAL LOW (ref >60)
GFR calc non Af Amer: 31 mL/min — ABNORMAL LOW (ref >60)

## 2019-05-19 LAB — HEPATIC FUNCTION PANEL
ALT: 17 U/L (ref 0–44)
AST: 48 U/L — ABNORMAL HIGH (ref 15–41)
Albumin: 1.7 g/dL — ABNORMAL LOW (ref 3.5–5.0)
Alkaline Phosphatase: 95 U/L (ref 38–126)
Bilirubin, Direct: 0.3 mg/dL — ABNORMAL HIGH (ref 0.0–0.2)
Indirect Bilirubin: 0.6 mg/dL (ref 0.3–0.9)
Total Bilirubin: 0.9 mg/dL (ref 0.3–1.2)
Total Protein: 4.4 g/dL — ABNORMAL LOW (ref 6.5–8.1)

## 2019-05-19 LAB — PROCALCITONIN: Procalcitonin: 2.09 ng/mL

## 2019-05-19 LAB — POC SARS CORONAVIRUS 2 AG
SARS Coronavirus 2 Ag: NEGATIVE
SARS Coronavirus 2 Ag: NEGATIVE
SARS Coronavirus 2 Ag: NEGATIVE

## 2019-05-19 LAB — TSH: TSH: 2.407 u[IU]/mL (ref 0.350–4.500)

## 2019-05-19 LAB — BRAIN NATRIURETIC PEPTIDE: B Natriuretic Peptide: 633 pg/mL — ABNORMAL HIGH (ref 0.0–100.0)

## 2019-05-19 MED ORDER — SODIUM CHLORIDE 0.9% FLUSH
3.0000 mL | Freq: Once | INTRAVENOUS | Status: AC
  Administered 2019-05-19: 3 mL via INTRAVENOUS

## 2019-05-19 MED ORDER — POLYETHYLENE GLYCOL 3350 17 G PO PACK: 17.0000 g | PACK | Freq: Every day | ORAL | Status: DC | PRN

## 2019-05-19 MED ORDER — SODIUM CHLORIDE 0.9% FLUSH
3.0000 mL | Freq: Two times a day (BID) | INTRAVENOUS | Status: DC
  Administered 2019-05-19 – 2019-05-29 (×16): 3 mL via INTRAVENOUS

## 2019-05-19 MED ORDER — FUROSEMIDE 10 MG/ML IJ SOLN
40.0000 mg | Freq: Once | INTRAMUSCULAR | Status: AC
  Administered 2019-05-19: 40 mg via INTRAVENOUS
  Filled 2019-05-19: qty 4

## 2019-05-19 MED ORDER — SODIUM CHLORIDE 0.9 % IV SOLN
1.0000 g | INTRAVENOUS | Status: DC
  Administered 2019-05-19: 1 g via INTRAVENOUS
  Filled 2019-05-19 (×2): qty 10

## 2019-05-19 MED ORDER — ACETAMINOPHEN 650 MG RE SUPP: 650.0000 mg | Freq: Four times a day (QID) | RECTAL | Status: DC | PRN

## 2019-05-19 MED ORDER — SODIUM CHLORIDE 0.9% FLUSH
3.0000 mL | INTRAVENOUS | Status: DC | PRN
  Administered 2019-05-21 – 2019-05-26 (×3): 3 mL via INTRAVENOUS

## 2019-05-19 MED ORDER — ASPIRIN 81 MG PO CHEW
81.0000 mg | CHEWABLE_TABLET | Freq: Once | ORAL | Status: AC
  Administered 2019-05-19: 81 mg via ORAL
  Filled 2019-05-19: qty 1

## 2019-05-19 MED ORDER — FUROSEMIDE 10 MG/ML IJ SOLN
40.0000 mg | Freq: Two times a day (BID) | INTRAMUSCULAR | Status: DC
  Administered 2019-05-20 – 2019-05-22 (×4): 40 mg via INTRAVENOUS
  Filled 2019-05-19 (×5): qty 4

## 2019-05-19 MED ORDER — ASPIRIN 81 MG PO CHEW: 324.0000 mg | CHEWABLE_TABLET | Freq: Once | ORAL | Status: DC

## 2019-05-19 MED ORDER — SODIUM CHLORIDE 0.9 % IV SOLN
500.0000 mg | INTRAVENOUS | Status: DC
  Administered 2019-05-20: 500 mg via INTRAVENOUS
  Filled 2019-05-19 (×2): qty 500

## 2019-05-19 MED ORDER — BISACODYL 5 MG PO TBEC: 5.0000 mg | DELAYED_RELEASE_TABLET | Freq: Every day | ORAL | Status: DC | PRN

## 2019-05-19 MED ORDER — SODIUM CHLORIDE 0.9 % IV SOLN
250.0000 mL | INTRAVENOUS | Status: DC | PRN
  Administered 2019-05-19: 250 mL via INTRAVENOUS

## 2019-05-19 MED ORDER — ACETAMINOPHEN 325 MG PO TABS
650.0000 mg | ORAL_TABLET | Freq: Four times a day (QID) | ORAL | Status: DC | PRN
  Administered 2019-05-20 – 2019-05-24 (×3): 650 mg via ORAL
  Filled 2019-05-19 (×3): qty 2

## 2019-05-19 MED ORDER — ENOXAPARIN SODIUM 40 MG/0.4ML ~~LOC~~ SOLN
40.0000 mg | SUBCUTANEOUS | Status: DC
  Administered 2019-05-19 – 2019-05-29 (×11): 40 mg via SUBCUTANEOUS
  Filled 2019-05-19 (×11): qty 0.4

## 2019-05-19 NOTE — ED Notes (Signed)
Xray staff to bedside again but pt now adamant he must immediately use urinal. Interpreting system used again as pt was trying to explain something to this RN. Pt requested privacy and promised this RN he would not attempt to stand at bedside. Stated he would use urinal in bed. Reports recent issues with dec ability to urinate when he feels the urge to go and that it burns when he does try to urinate.

## 2019-05-19 NOTE — ED Notes (Signed)
Interpreter request placed as pt is deaf. Introduced self to pt. Pt somewhat dypnic and tachypnic at rest in bed. Placed on 2L O2 via [place].

## 2019-05-19 NOTE — ED Notes (Signed)
Gave pt half cup of water with verbal okay from [place].

## 2019-05-19 NOTE — ED Notes (Signed)
Pt attempted to get up to restroom on his own. Pt did not use call bell. This RN entered room to find pt sitting on edge of bed with stool in a trail from bed to bedside toilet. Pt given new gown and bath wipes insisting this RN give him privacy to change but pt too weak to do so alone. Assisted to change into gown and called EVS to assist with cleaning floor.

## 2019-05-19 NOTE — ED Notes (Signed)
Xray staff to bedside but would like to wait for interpreter to explain to pt what they're doing. This RN to call xray staff once interpreter at bedside.

## 2019-05-19 NOTE — ED Notes (Signed)
Admitting provider at bedside.

## 2019-05-19 NOTE — ED Notes (Signed)
Consulting providers at bedside. Will place IV for 2nd trop collection once providers done communicating with pt. Will also collect covid swab then.

## 2019-05-19 NOTE — ED Notes (Signed)
Explained to wife that no visitors allowed in ED at this time per policy.  Pt is oriented per triage note.  Wife also is deaf and so still need sign interpretor to communicate with pt and wife.  Informed her that she can wait outside or is welcome to go home and they can update her once something is known.

## 2019-05-19 NOTE — ED Triage Notes (Signed)
Pt presents to ED via POV with c/o weakness, swelling to BLE and to testicles. Pt is deaf, video interpreter used for triage. Pt's wife reports increasing weakness, states patient needs a shower due to being unable to take care of him self and swelling to BLE and testicles. Pt noted to be pale in color upon arrival, significant edema noted to BLE upon arrival to ED. Pt with noted weakness to all extremities.   Upon triage completion pt placed in front of staff for best visual access.   This RN explained to patient's wife prior to patient's departure from triage that if she was needed she would be contacted.

## 2019-05-19 NOTE — ED Notes (Signed)
Pt gave permission to update wife. Wife updated.

## 2019-05-19 NOTE — ED Notes (Signed)
Using interpreting system pt assessed. Pt reports recent change in swelling of legs; slightly inc SOB; leg swelling bilaterally 3+; skin at legs pink and pt has blisters on lower legs; pt currently on 4L of O2 via [place]; denies history of COPD; states has had heart surgery in the past. Denies CP currently. States mild leg discomfort. Explained to pt xray is to be completed soon. Pt agreeable. Explained why his wife cannot be at bedside with current visitor restrictions. Pt understanding. Pt denies any needs currently. Explained use of call bell. Bed locked low. Rails up. Call bell within reach.

## 2019-05-19 NOTE — ED Notes (Signed)
Pt repositioned in bed and given another blanket. EDP Kinner notified of elevated Trop in person.

## 2019-05-19 NOTE — ED Notes (Signed)
Pt given more warm blankets. Denies any other needs. Bed locked low. Legs raised. Rail up. Call bell within reach.

## 2019-05-19 NOTE — Consult Note (Signed)
Cardiology Consultation:   Patient ID: Thomas [name]; [id number]; 29-Mar-1934   Admit date: 05/19/2019 Date of Consult: 05/19/2019  Primary Care Provider: [name], MD Primary Cardiologist: Croitoru Primary Electrophysiologist:  [name]   Patient Profile:   Thomas [name] is a 85 y.o. male with a hx of CAD status post remote CABG with subsequent PCI/BMS to the SVG to OM in 2015 with known CTO of the SVG to RCA and patent LIMA to LAD, chronic combined systolic and diastolic CHF, ICM, Medtronic CRT-D implanted in 2014 permanent A. fib not on [place] secondary to underlying microcytic anemia with known AVMs as well as falls, prior GI bleed requiring packed red blood cell transfusion with subsequent discovery of aforementioned AVMs, CKD stage III, prostate cancer, HTN, HLD, and deafness who is being seen today for the evaluation of lower extremity/scrotal swelling and orthopnea at the request of Dr. Jacqualine Code.  History of Present Illness:   Mr. Weldin status post remote CABG with subsequent multiple PCI's, most recently in 09/2013 with PTCA/BMS to the SVG to OM as well as CTO of the SVG to RCA and patent LIMA to LAD.  Most recent echo from 04/2018 showed an EF of 35 to 40% with moderately dilated LV, mildly reduced RV systolic function with moderately enlarged RV cavity size, severe biatrial enlargement, mild to moderate mitral regurgitation, mild aortic insufficiency, moderately dilated aortic root and ascending aorta, and moderate pulmonary hypertension.  More recently, patient called his primary cardiologist office in 02/2019 noting chills and generalized weakness with associated cough.  He was advised to contact his PCP.  He called later in the month noting lower extremity swelling for the past year.  He confirmed he was taking Lasix 80 mg daily and denied any weight gain at that time.  He was not wearing compression stockings.  This was followed by a call later in the month.  Patient presented  to the ED on 3/5 noting a 1 to 2-year history of generalized weakness, myalgias and arthralgias as well as lower extremity and scrotal swelling which has been worse over the past 2 weeks.  There has been some associated orthopnea.  He denies any chest pain.  Indicates there have been no changes to his diet or medications.  He is unable to elaborate on his symptoms any further.  Upon the patient's arrival to [facility] they were found to have BP in the low 100s over 60s to 70s, HR 60s to the 100s bpm, temp afebrile, oxygen saturation ranging from the mid 80s to mid 90s% requiring supplemental oxygen via nasal cannula at 2 L, weight not documented. EKG showed AV paced rhythm as outlined below, CXR showed persistent bilateral, small left and trace right pleural effusions with possible pneumonia in the left lung base as well as unchanged cardiomegaly with pulmonary vascular congestion. Labs showed an initial high-sensitivity troponin of 266 with delta pending, BNP 633, WBC 14.2, Hgb 8.2 with baseline approximately 7-8 it appears, PLT 283, potassium 4.1, BUN 37, serum creatinine 1.73 with baseline approximately 1.1-1.2.  At time of cardiology consult, ASL video interpreter was used.  However, she was having difficulty understanding what the patient was signing.  Therefore, she got a second interpreter on the video call for video conferencing and with the assistance of 2 ASL interpreters we were able to communicate with the patient.  Patient indicates for the past 1 to 2 years he has been "sore all over" he has also noted bilateral lower extremity  swelling, abdominal distention, swelling in his arms, cough productive of yellow sputum, and orthopnea.  Despite him saying he has pain all over he he denies any chest pain.  He does state the lower extremity swelling has been worse for the past 2 weeks though is unable to explain why this may be.  He denies any recent changes in his medications or diet.  He has not received any  therapies or interventions.   Past Medical History:  Diagnosis Date  . AICD (automatic cardioverter/defibrillator) present   . Biventricular ICD (implantable cardioverter-defibrillator) in place 03/24/2005   Implantation of a Medtronic Adapta ADDRO1, serial number [id number]  . CHF (congestive heart failure) ([place])   . CKD (chronic kidney disease), stage III   . Coronary artery disease    a. s/p CABG 1986. b. Multiple PCIs/caths. c. 09/2013: s/p PTCA and BMS to SVG-OM.  [name]   . Dysrhythmia   . History of abdominal aortic aneurysm   . History of bleeding peptic ulcer 1980  . History of epididymitis 2013  . HTN (hypertension)   . Hydronephrosis with ureteropelvic junction obstruction   . Hydroureter on left 2009  . Hypertension   . Ischemic cardiomyopathy    a. Prior EF 30-35%, s/p BIV-ICD. b. 09/2013: EF 45-50%.  . Moderate tricuspid regurgitation   . PAF (paroxysmal atrial fibrillation) ([place])   . Presence of permanent cardiac pacemaker   . Prostate cancer ([place])   . Status post coronary artery bypass grafting 1986   LIMA to the LAD, SVG to OM, SVG to RCA  . Testicular swelling     Past Surgical History:  Procedure Laterality Date  . 2-D echocardiogram  11/20/2011   Ejection fraction 30-35% moderate concentric left ventricular hypertrophy. Left atrium is moderately dilated. Mild MR. Mild or  . BI-VENTRICULAR IMPLANTABLE CARDIOVERTER DEFIBRILLATOR N/A 12/16/2012   Procedure: BI-VENTRICULAR IMPLANTABLE CARDIOVERTER DEFIBRILLATOR  (CRT-D);  Surgeon: [name], MD;  Location: [facility] CATH LAB;  Service: Cardiovascular;  Laterality: N/A;  . CARDIAC CATHETERIZATION  12/10/2011   SVG to OM widely patent.  LIMA to LAD patent  . CATARACT EXTRACTION W/PHACO Right 10/12/2017   Procedure: CATARACT EXTRACTION PHACO AND INTRAOCULAR LENS PLACEMENT (IOC);  Surgeon: Birder Robson, MD;  Location: [facility] ORS;  Service: Ophthalmology;  Laterality: Right;  [place] 00:57 AP% 15.9 CDE 9.07 Fluid pack  lot # [id number]  . COLONOSCOPY N/A 07/13/2018   Procedure: COLONOSCOPY;  Surgeon: Toledo, Benay Pike, MD;  Location: [facility] ENDOSCOPY;  Service: Gastroenterology;  Laterality: N/A;  . CORONARY ARTERY BYPASS GRAFT  1986  . ENTEROSCOPY N/A 09/14/2018   Procedure: ENTEROSCOPY;  Surgeon: Toledo, Benay Pike, MD;  Location: [facility] ENDOSCOPY;  Service: Gastroenterology;  Laterality: N/A;  symptomatic anemia, GI blood loss anemia, melena, positive small bowel capsule endoscopy showing source of bleeding   . ESOPHAGOGASTRODUODENOSCOPY N/A 07/13/2018   Procedure: ESOPHAGOGASTRODUODENOSCOPY (EGD);  Surgeon: Toledo, Benay Pike, MD;  Location: [facility] ENDOSCOPY;  Service: Gastroenterology;  Laterality: N/A;  . ESOPHAGOGASTRODUODENOSCOPY N/A 09/14/2018   Procedure: ESOPHAGOGASTRODUODENOSCOPY (EGD);  Surgeon: Toledo, Benay Pike, MD;  Location: [facility] ENDOSCOPY;  Service: Gastroenterology;  Laterality: N/A;  SIGN LANAGUAGE INTERPRETER  . ESOPHAGOGASTRODUODENOSCOPY (EGD) WITH PROPOFOL N/A 05/27/2018   Procedure: ESOPHAGOGASTRODUODENOSCOPY (EGD) WITH PROPOFOL;  Surgeon: Clarene Essex, MD;  Location: [place];  Service: Endoscopy;  Laterality: N/A;  . INSERT / REPLACE / REMOVE PACEMAKER    . LEFT HEART CATHETERIZATION WITH CORONARY/GRAFT ANGIOGRAM N/A 12/10/2011   Procedure: LEFT HEART CATHETERIZATION WITH CORONARY/GRAFT ANGIOGRAM;  Surgeon:  Sanda Klein, MD;  Location: [place] CATH LAB;  Service: Cardiovascular;  Laterality: N/A;  . LEFT HEART CATHETERIZATION WITH CORONARY/GRAFT ANGIOGRAM N/A 09/25/2013   Procedure: LEFT HEART CATHETERIZATION WITH Beatrix Fetters;  Surgeon: Blane Ohara, MD;  Location[facility] CATH LAB;  Service: Cardiovascular;  Laterality: N/A;  . Persantine Myoview  05/06/2010   Post-rest ejection fraction 30%. No significant ischemia demonstrated. Compared to previous study there is no significant change.  . TRANSURETHRAL RESECTION OF PROSTATE     s/p     Home Meds: Prior to Admission medications     Medication Sig Start Date End Date Taking? Authorizing Provider  albuterol (VENTOLIN HFA) 108 (90 Base) MCG/ACT inhaler Inhale 2 puffs into the lungs 4 (four) times daily as needed. Patient taking differently: Inhale 2 puffs into the lungs 4 (four) times daily as needed for wheezing or shortness of breath.  08/02/18   Croitoru, Mihai, MD  carvedilol (COREG) 3.125 MG tablet Take 1 tablet (3.125 mg total) by mouth 2 (two) times daily with a meal. 08/17/18   Lavina Hamman, MD  ferrous sulfate 325 (65 FE) MG tablet Take 1 tablet (325 mg total) by mouth daily for 30 days. 05/28/18   Elodia Florence., MD  furosemide (LASIX) 40 MG tablet Take 2 tablets (80 mg total) by mouth daily. 10/27/18   Croitoru, Mihai, MD  HYDROCORTISONE, TOPICAL, 2 % LOTN Apply 40 oz topically 2 (two) times daily as needed (as needed for itching on foot). APPLY BID TO AFFECTED AREA 08/23/18   Croitoru, [name] Gobble, MD  isosorbide mononitrate (IMDUR) 30 MG 24 hr tablet Take 1 tablet (30 mg total) by mouth daily. 08/18/18   Lavina Hamman, MD  losartan (COZAAR) 25 MG tablet Take 1 tablet (25 mg total) by mouth daily. 11/09/18 02/07/19  Alisa Graff, FNP  metolazone (ZAROXOLYN) 2.5 MG tablet Take 1 tablet (2.5 mg) by mouth on the morning of March 09, 2019. Take 30 minutes before taking Lasix. *ONLY TAKE WHEN INSTRUCTED BY CARDIOLOGIST TO DO SO* 03/09/19   Croitoru, Mihai, MD  pantoprazole (PROTONIX) 40 MG tablet Take 1 tablet (40 mg total) by mouth 2 (two) times daily before a meal. Patient taking differently: Take 40 mg by mouth daily.  09/16/18   Demetrios Loll, MD  simvastatin (ZOCOR) 20 MG tablet TAKE 1 TABLET BY MOUTH EVERY DAY 04/07/19   Erlene Quan, PA-C  vitamin B-12 (CYANOCOBALAMIN) 500 MCG tablet Take 1 tablet (500 mcg total) by mouth daily. 08/18/18   Maisie Fus, MD    Inpatient Medications: Scheduled Meds: . sodium chloride flush  3 mL Intravenous Once   Continuous Infusions:  PRN Meds:   Allergies:   Allergies   Allergen Reactions  . Phenazopyridine Nausea Only and Other (See Comments)    GI UPSET  . Entresto [Sacubitril-Valsartan] Swelling    And bruising of arm  . Ramipril Other (See Comments)    unk Other reaction(s): Other (See Comments), Unknown unk    Social History:   Social History   Socioeconomic History  . Marital status: Married    Spouse name: Not on file  . Number of children: Not on file  . Years of education: Not on file  . Highest education level: Not on file  Occupational History  . Not on file  Tobacco Use  . Smoking status: Former Smoker    Quit date: 03/15/1985    Years since quitting: 34.2  . Smokeless tobacco: Never Used  Substance  and Sexual Activity  . Alcohol use: No    Comment: occas.  . Drug use: No  . Sexual activity: Yes  Other Topics Concern  . Not on file  Social History Narrative  . Not on file   Social Determinants of Health   Financial Resource Strain: Low Risk   . Difficulty of Paying Living Expenses: Not hard at all  Food Insecurity: No Food Insecurity  . Worried About Charity fundraiser in the Last Year: Never true  . Ran Out of Food in the Last Year: Never true  Transportation Needs: No Transportation Needs  . Lack of Transportation (Medical): No  . Lack of Transportation (Non-Medical): No  Physical Activity: Inactive  . Days of Exercise per Week: 0 days  . Minutes of Exercise per Session: 0 min  Stress: No Stress Concern Present  . Feeling of Stress : Not at all  Social Connections: Slightly Isolated  . Frequency of Communication with Friends and Family: Never  . Frequency of Social Gatherings with Friends and Family: Never  . Attends Religious Services: 1 to 4 times per year  . Active Member of Clubs or Organizations: Yes  . Attends Archivist Meetings: 1 to 4 times per year  . Marital Status: Married  Human resources officer Violence: Not At Risk  . Fear of Current or Ex-Partner: No  . Emotionally Abused: No  .  Physically Abused: No  . Sexually Abused: No     Family History:   Family History  Problem Relation Age of Onset  . Hypertension Father     ROS:  Review of Systems  Constitutional: Positive for malaise/fatigue. Negative for chills, diaphoresis, fever and weight loss.  HENT: Positive for hearing loss. Negative for congestion.   Eyes: Negative for discharge and redness.  Respiratory: Positive for cough, sputum production and shortness of breath. Negative for hemoptysis and wheezing.        Cough productive of yellow sputum for the past 1 to 2 years  Cardiovascular: Positive for orthopnea and leg swelling. Negative for chest pain, palpitations, claudication and PND.       Lower extremity swelling present for 1 to 2 years  Gastrointestinal: Negative for abdominal pain, blood in stool, heartburn, melena, nausea and vomiting.  Genitourinary: Negative for hematuria.  Musculoskeletal: Positive for back pain, joint pain and myalgias. Negative for falls.  Skin: Negative for rash.  Neurological: Positive for weakness. Negative for dizziness, tingling, tremors, sensory change, speech change, focal weakness and loss of consciousness.  Endo/Heme/Allergies: Does not bruise/bleed easily.  Psychiatric/Behavioral: Negative for substance abuse. The patient is not nervous/anxious.   All other systems reviewed and are negative.     Physical Exam/Data:   Vitals:   05/19/19 1500 05/19/19 1515 05/19/19 1530 05/19/19 1547  BP: 102/79   101/79  Pulse:  89    Resp:   (!) 29 15  Temp:      TempSrc:      SpO2:       No intake or output data in the 24 hours ending 05/19/19 1556 There were no vitals filed for this visit. There is no height or weight on file to calculate BMI.   Physical Exam: General: Deaf.  Well developed, well nourished, in no acute distress. Head: Normocephalic, atraumatic, sclera non-icteric, no xanthomas, nares without discharge.  Neck: Negative for carotid bruits.  Lungs:  Diminished breath sounds bilaterally, left worse than right, crackles bilaterally. Breathing is unlabored. Heart: Bradycardic with S1 S2. III/VI  blowing systolic murmurs best heard at the apex, no rubs, or gallops appreciated. Abdomen: Soft, non-tender, non-distended with normoactive bowel sounds. No hepatomegaly. No rebound/guarding. No obvious abdominal masses. Msk:  Strength and tone appear normal for age. Extremities: No clubbing or cyanosis.  3+ bilateral lower extremity pitting and weeping edema to the thighs. Distal pedal pulses are 2+ and equal bilaterally. Neuro: Alert and oriented X 3. No facial asymmetry. No focal deficit. Moves all extremities spontaneously. Psych:  Responds to questions appropriately with a normal affect.   EKG:  The EKG was personally reviewed and demonstrates: V paced rhythm, 63 bpm Telemetry:  Telemetry was personally reviewed and demonstrates: Paced  Weights: There were no vitals filed for this visit.  Relevant CV Studies:  2D echo 05/02/2018: 1. The left ventricle has moderately reduced systolic function, with an  ejection fraction of 35-40%. The cavity size was moderately dilated. Left  ventricular diastology could not be evaluated.  2. The right ventricle has mildly reduced systolic function. The cavity  was moderately enlarged. There is no increase in right ventricular wall  thickness.  3. Left atrial size was severely dilated.  4. Right atrial size was severely dilated.  5. The mitral valve is normal in structure. Mitral valve regurgitation is  mild to moderate by color flow Doppler. The MR jet is eccentric  posteriorly directed.  6. The tricuspid valve is normal in structure.  7. The aortic valve is tricuspid Aortic valve regurgitation is mild by  color flow Doppler.  8. The pulmonic valve was normal in structure.  9. There is moderate dilatation of the aortic root and of the ascending  aorta.  10. Moderately dilated pulmonary artery.    11. Pulmonary hypertension is moderate.  12. The inferior vena cava was dilated in size with <50% respiratory  variability.  13. Right atrial pressure is estimated at 15 mmHg.  Laboratory Data:  Chemistry Recent Labs  Lab 05/19/19 1404  NA 139  K 4.1  CL 105  CO2 26  GLUCOSE 108*  BUN 37*  CREATININE 1.73*  CALCIUM 8.0*  GFRNONAA 36*  GFRAA 42*  ANIONGAP 8    No results for input(s): PROT, ALBUMIN, AST, ALT, ALKPHOS, BILITOT in the last 168 hours. Hematology Recent Labs  Lab 05/19/19 1404  WBC 14.2*  RBC 3.55*  HGB 8.2*  HCT 27.5*  MCV 77.5*  MCH 23.1*  MCHC 29.8*  RDW 21.0*  PLT 283   Cardiac EnzymesNo results for input(s): TROPONINI in the last 168 hours. No results for input(s): TROPIPOC in the last 168 hours.  BNPNo results for input(s): BNP, PROBNP in the last 168 hours.  DDimer No results for input(s): DDIMER in the last 168 hours.  Radiology/Studies:  No results found.  Assessment and Plan:   1.  Acute hypoxic respiratory distress: -Likely multifactorial including acute on chronic HFrEF secondary to ICM, left base pneumonia, and anemia -Supplemental oxygen per primary service -Remaining as below  2. Acute on chronic combined systolic and diastolic CHF/ICM/lower extremity swelling status post BiV ICD: -Overall, this is a difficult clinical scenario given language barrier as it took to ASL interpreters to communicate with the patient.  Even with this, it was still difficult to communicate and obtain details from the patient in an effective manner. -It appears he has had 1 to 2-year history of symptoms leading to his presentation with a possible 2-week history of worsening lower extremity swelling prompting him to come into the ED today -He does appear  significantly edematous though there could be numerous etiologies playing a role in this presentation with work-up pending -Cannot exclude degree of third spacing secondary to underlying anemia and  hypoalbuminemia with prior albumin of 2.6 -Chest x-ray did show persistent bilateral small left and trace right pleural effusions along with possible pneumonia along the left lung base which corresponds to auscultation on exam -Recommend LFT, TSH, further work-up of his anemia, and echo -Recommend a dose of IV Lasix in the ED and monitoring strict urine output with close monitoring of his renal function and KCl repletion as indicated -Further recommendations pending the above evaluation -Device appears to be functioning normally with normal device interrogation 05/12/2019 -For now, we will need to hold GDMT including Coreg, Imdur, and losartan secondary to relative hypotension possibly in the setting of underlying sepsis  2.  CAD status post CABG status post PCI/BMS without angina/elevated high-sensitivity troponin: -Patient denies chest pain -His mildly elevated troponin is possibly in the setting of supply demand ischemia secondary to underlying known coronary disease with underlying volume overload, possible pneumonia, anemia, and AKI -Cycle delta troponin -Currently, no indication for heparin drip -ASA -Check echo as above -Recommend holding simvastatin until LFT is back to evaluate for component of congestive hepatopathy  -Further recommendations pending  3.  Possible left base pneumonia/leukocytosis: -Patient complains of a cough productive of yellow sputum with x-ray demonstrating possible pneumonia along the left base which corresponds to lung auscultation on exam -Defer antibiotics to ED/primary service -Recommend incentive spirometry -Possible pneumonia likely playing a role in the patient's generalized malaise and myalgias -Consider PCT to guide antibiotic therapy  4.  Microcytic anemia with prior GI bleed and subsequent discovery of AVMs: -Appears stable, though is likely playing a significant role in his overall presentation -Management per primary service  5.  AKI: -Cannot  exclude some degree of vascular congestion -Recommend dose of IV Lasix in the ED as outlined above with close monitoring of urine output and renal function  6.  Language barrier: -As above, it took to ASL interpreters to complete our consult in the ED  7.  HTN: -Blood pressure is on the soft side with readings in the low [id number] systolic, possibly in the setting of underlying sepsis -Recommend the holding of Coreg, Imdur, and losartan  8.  HLD: -Recommend holding simvastatin until LFTs back as outlined above   For questions or updates, please contact [place] Please consult [email or web address] for contact info under Cardiology/STEMI.   Signed, Christell Faith, PA-C [place] Pager: [phone] 05/19/2019, 3:56 PM

## 2019-05-19 NOTE — H&P (Signed)
History and Physical:    Thomas [name]   [id number] DOB: 10-05-1934 DOA: 05/19/2019  Referring MD/provider: Dr Katy Fitch PCP: [name], MD   Patient coming from: Home  Chief Complaint: Cough and shortness of breath.  Patient is seen with the assistance of ASL translator.  History of Present Illness:   Thomas [name] is an 85 y.o. male with PMH significant for HTN, CAD, combined systolic and diastolic heart failure, atrial fibrillation not on anticoagulation given history of GI bleeds, biventricular pacemaker, lymphedema who presents complaining of cough and shortness of breath for the past 3 to 4 days.  Of note patient's wife was diagnosed with Covid 2 weeks ago.  Patient states that his cough has increased significantly over the past 2 days to the point where he is dizzy from coughing.  He also complains of orthopnea and has taken to sleeping in his recliner for the past couple of days, maybe longer, patient is not sure.  Patient denies any fevers or chills.  He admits to some fatigue.  No loss of taste or smell.  He has minimal anorexia.  No diarrhea nausea or vomiting.  Cough is minimally productive of yellow phlegm.  No hemoptysis.  Patient denies any chest pain.  Patient notes he has had significant swelling in his legs and his scrotum for a very long time, probably more than a year.  Patient states that it was not as bad a year ago but it has become really bad recently, is unable to specify how long.  ED Course:  The patient was noted to be considerably hypoxic with O2 sats in the mid 80s on room air.  His blood pressure was soft.  They had some difficulty getting a good history from him due to translation difficulties.  He was seen by cardiology who thought he was likely suffering from acute on chronic heart failure.  They recommended diuresis with Lasix and an echocardiogram was ordered.  ROS:   ROS   Review of Systems: As per HPI  Past Medical History:   Past  Medical History:  Diagnosis Date  . AICD (automatic cardioverter/defibrillator) present   . Biventricular ICD (implantable cardioverter-defibrillator) in place 03/24/2005   Implantation of a Med[employer]onic Adapta ADDRO1, serial number [id number]  . CHF (congestive heart failure) ([place])   . CKD (chronic kidney disease), stage III   . Coronary artery disease    a. s/p CABG 1986. b. Multiple PCIs/caths. c. 09/2013: s/p PTCA and BMS to SVG-OM.  [name]   . Dysrhythmia   . History of abdominal aortic aneurysm   . History of bleeding peptic ulcer 1980  . History of epididymitis 2013  . HTN (hypertension)   . Hydronephrosis with ureteropelvic junction obstruction   . Hydroureter on left 2009  . Hypertension   . Ischemic cardiomyopathy    a. Prior EF 30-35%, s/p BIV-ICD. b. 09/2013: EF 45-50%.  . Moderate tricuspid regurgitation   . PAF (paroxysmal atrial fibrillation) ([place])   . Presence of permanent cardiac pacemaker   . Prostate cancer ([place])   . Status post coronary artery bypass grafting 1986   LIMA to the LAD, SVG to OM, SVG to RCA  . Testicular swelling     Past Surgical History:   Past Surgical History:  Procedure Laterality Date  . 2-D echocardiogram  11/20/2011   Ejection fraction 30-35% moderate concentric left ventricular hypertrophy. Left atrium is moderately dilated. Mild MR. Mild or  . BI-VENTRICULAR IMPLANTABLE CARDIOVERTER DEFIBRILLATOR  N/A 12/16/2012   Procedure: BI-VENTRICULAR IMPLANTABLE CARDIOVERTER DEFIBRILLATOR  (CRT-D);  Surgeon: [name], MD;  Location: [facility] CATH LAB;  Service: Cardiovascular;  Laterality: N/A;  . CARDIAC CATHETERIZATION  12/10/2011   SVG to OM widely patent.  LIMA to LAD patent  . CATARACT EXTRACTION W/PHACO Right 10/12/2017   Procedure: CATARACT EXTRACTION PHACO AND INTRAOCULAR LENS PLACEMENT (IOC);  Surgeon: Birder Robson, MD;  Location: [facility] ORS;  Service: Ophthalmology;  Laterality: Right;  [place] 00:57 AP% 15.9 CDE 9.07 Fluid pack lot #  [id number]  . COLONOSCOPY N/A 07/13/2018   Procedure: COLONOSCOPY;  Surgeon: Toledo, Benay Pike, MD;  Location: [facility] ENDOSCOPY;  Service: Gastroenterology;  Laterality: N/A;  . CORONARY ARTERY BYPASS GRAFT  1986  . ENTEROSCOPY N/A 09/14/2018   Procedure: ENTEROSCOPY;  Surgeon: Toledo, Benay Pike, MD;  Location: [facility] ENDOSCOPY;  Service: Gastroenterology;  Laterality: N/A;  symptomatic anemia, GI blood loss anemia, melena, positive small bowel capsule endoscopy showing source of bleeding   . ESOPHAGOGASTRODUODENOSCOPY N/A 07/13/2018   Procedure: ESOPHAGOGASTRODUODENOSCOPY (EGD);  Surgeon: Toledo, Benay Pike, MD;  Location: [facility] ENDOSCOPY;  Service: Gastroenterology;  Laterality: N/A;  . ESOPHAGOGASTRODUODENOSCOPY N/A 09/14/2018   Procedure: ESOPHAGOGASTRODUODENOSCOPY (EGD);  Surgeon: Toledo, Benay Pike, MD;  Location: [facility] ENDOSCOPY;  Service: Gastroenterology;  Laterality: N/A;  SIGN LANAGUAGE INTERPRETER  . ESOPHAGOGASTRODUODENOSCOPY (EGD) WITH PROPOFOL N/A 05/27/2018   Procedure: ESOPHAGOGASTRODUODENOSCOPY (EGD) WITH PROPOFOL;  Surgeon: Clarene Essex, MD;  Location: [place];  Service: Endoscopy;  Laterality: N/A;  . INSERT / REPLACE / REMOVE PACEMAKER    . LEFT HEART CATHETERIZATION WITH CORONARY/GRAFT ANGIOGRAM N/A 12/10/2011   Procedure: LEFT HEART CATHETERIZATION WITH Beatrix Fetters;  Surgeon: Sanda Klein, MD;  Location: [facility] CATH LAB;  Service: Cardiovascular;  Laterality: N/A;  . LEFT HEART CATHETERIZATION WITH CORONARY/GRAFT ANGIOGRAM N/A 09/25/2013   Procedure: LEFT HEART CATHETERIZATION WITH Beatrix Fetters;  Surgeon: Blane Ohara, MD;  Location: [facility] CATH LAB;  Service: Cardiovascular;  Laterality: N/A;  . Persantine Myoview  05/06/2010   Post-rest ejection fraction 30%. No significant ischemia demonstrated. Compared to previous study there is no significant change.  . TRANSURETHRAL RESECTION OF PROSTATE     s/p    Social History:   Social History   Socioeconomic  History  . Marital status: Married    Spouse name: Not on file  . Number of children: Not on file  . Years of education: Not on file  . Highest education level: Not on file  Occupational History  . Not on file  Tobacco Use  . Smoking status: Former Smoker    Quit date: 03/15/1985    Years since quitting: 34.2  . Smokeless tobacco: Never Used  Substance and Sexual Activity  . Alcohol use: No    Comment: occas.  . Drug use: No  . Sexual activity: Yes  Other Topics Concern  . Not on file  Social History Narrative  . Not on file   Social Determinants of Health   Financial Resource Strain: Low Risk   . Difficulty of Paying Living Expenses: Not hard at all  Food Insecurity: No Food Insecurity  . Worried About Charity fundraiser in the Last Year: Never true  . Ran Out of Food in the Last Year: Never true  Transportation Needs: No Transportation Needs  . Lack of Transportation (Medical): No  . Lack of Transportation (Non-Medical): No  Physical Activity: Inactive  . Days of Exercise per Week: 0 days  . Minutes of Exercise per Session: 0 min  Stress: No Stress Concern Present  . Feeling of Stress : Not at all  Social Connections: Slightly Isolated  . Frequency of Communication with Friends and Family: Never  . Frequency of Social Gatherings with Friends and Family: Never  . Attends Religious Services: 1 to 4 times per year  . Active Member of Clubs or Organizations: Yes  . Attends Archivist Meetings: 1 to 4 times per year  . Marital Status: Married  Human resources officer Violence: Not At Risk  . Fear of Current or Ex-Partner: No  . Emotionally Abused: No  . Physically Abused: No  . Sexually Abused: No    Allergies   Phenazopyridine, Entresto [sacubitril-valsartan], and Ramipril  Family history:   Family History  Problem Relation Age of Onset  . Hypertension Father     Current Medications:   Prior to Admission medications   Medication Sig Start Date End  Date Taking? Authorizing Provider  albuterol (VENTOLIN HFA) 108 (90 Base) MCG/ACT inhaler Inhale 2 puffs into the lungs 4 (four) times daily as needed. Patient taking differently: Inhale 2 puffs into the lungs 4 (four) times daily as needed for wheezing or shortness of breath.  08/02/18   Croitoru, Mihai, MD  carvedilol (COREG) 3.125 MG tablet Take 1 tablet (3.125 mg total) by mouth 2 (two) times daily with a meal. 08/17/18   Lavina Hamman, MD  ferrous sulfate 325 (65 FE) MG tablet Take 1 tablet (325 mg total) by mouth daily for 30 days. 05/28/18   Elodia Florence., MD  furosemide (LASIX) 40 MG tablet Take 2 tablets (80 mg total) by mouth daily. 10/27/18   Croitoru, Mihai, MD  HYDROCORTISONE, TOPICAL, 2 % LOTN Apply 40 oz topically 2 (two) times daily as needed (as needed for itching on foot). APPLY BID TO AFFECTED AREA 08/23/18   Croitoru, [name] Gobble, MD  isosorbide mononitrate (IMDUR) 30 MG 24 hr tablet Take 1 tablet (30 mg total) by mouth daily. 08/18/18   Lavina Hamman, MD  losartan (COZAAR) 25 MG tablet Take 1 tablet (25 mg total) by mouth daily. 11/09/18 02/07/19  Alisa Graff, FNP  metolazone (ZAROXOLYN) 2.5 MG tablet Take 1 tablet (2.5 mg) by mouth on the morning of March 09, 2019. Take 30 minutes before taking Lasix. *ONLY TAKE WHEN INSTRUCTED BY CARDIOLOGIST TO DO SO* 03/09/19   Croitoru, Mihai, MD  pantoprazole (PROTONIX) 40 MG tablet Take 1 tablet (40 mg total) by mouth 2 (two) times daily before a meal. Patient taking differently: Take 40 mg by mouth daily.  09/16/18   Demetrios Loll, MD  simvastatin (ZOCOR) 20 MG tablet TAKE 1 TABLET BY MOUTH EVERY DAY 04/07/19   Erlene Quan, PA-C  vitamin B-12 (CYANOCOBALAMIN) 500 MCG tablet Take 1 tablet (500 mcg total) by mouth daily. 08/18/18   Maisie Fus, MD    Physical Exam:   Vitals:   05/19/19 1807 05/19/19 1808 05/19/19 1815 05/19/19 1830  BP: (!) 125/58   (!) 120/58  Pulse:  81 62 61  Resp: (!) 25  (!) 26   Temp:      TempSrc:        SpO2:  98% 100% 96%     Physical Exam: Blood pressure (!) 120/58, pulse 61, temperature 98.7 F (37.1 C), temperature source Oral, resp. rate (!) 26, SpO2 96 %. Gen: Chronically ill-appearing gentleman with massive lower extremity and scrotal edema with superficial blistering of the skin. Constitutional: Alert and awake, oriented x3, he has some  unlabored tachypnea. Eyes: sclera anicteric, conjuctiva clear,  CVS: [id number] clear 2/6 systolic murmur Respiratory: Decreased breath sounds bilaterally with rales at bases. GI: NABS, soft, NT, he has some fullness that is firm but not hard at his left lower quadrant which may be stool. LE: Massive edema in his lower extremities and scrotum all the way up to his buttock area. Neuro: A/O x 3, Moving all extremities equally with normal strength, CN 3-12 intact, grossly nonfocal.  Psych: patient is logical and coherent, judgement and insight appear normal, mood and affect appropriate to situation.   Data Review:    Labs: Basic Metabolic Panel: Recent Labs  Lab 05/19/19 1404  NA 139  K 4.1  CL 105  CO2 26  GLUCOSE 108*  BUN 37*  CREATININE 1.73*  CALCIUM 8.0*   Liver Function Tests: No results for input(s): AST, ALT, ALKPHOS, BILITOT, PROT, ALBUMIN in the last 168 hours. No results for input(s): LIPASE, AMYLASE in the last 168 hours. No results for input(s): AMMONIA in the last 168 hours. CBC: Recent Labs  Lab 05/19/19 1404  WBC 14.2*  HGB 8.2*  HCT 27.5*  MCV 77.5*  PLT 283   Cardiac Enzymes: No results for input(s): CKTOTAL, CKMB, CKMBINDEX, TROPONINI in the last 168 hours.  BNP (last 3 results) No results for input(s): PROBNP in the last 8760 hours. CBG: No results for input(s): GLUCAP in the last 168 hours.  Urinalysis    Component Value Date/Time   COLORURINE YELLOW 08/11/2018 1954   APPEARANCEUR CLEAR 08/11/2018 1954   APPEARANCEUR Clear 12/13/2017 0918   LABSPEC 1.021 08/11/2018 1954   LABSPEC 1.021 11/19/2011  1501   PHURINE 5.0 08/11/2018 1954   GLUCOSEU NEGATIVE 08/11/2018 1954   GLUCOSEU Negative 11/19/2011 1501   HGBUR NEGATIVE 08/11/2018 [place] NEGATIVE 08/11/2018 1954   BILIRUBINUR Negative 12/13/2017 0918   BILIRUBINUR Negative 11/19/2011 1501   [place] 08/11/2018 1954   PROTEINUR NEGATIVE 08/11/2018 1954   NITRITE NEGATIVE 08/11/2018 1954   LEUKOCYTESUR NEGATIVE 08/11/2018 1954   LEUKOCYTESUR Negative 11/19/2011 1501      Radiographic Studies: DG Chest Portable 1 View  Result Date: 05/19/2019 CLINICAL DATA:  Dyspnea. EXAM: PORTABLE CHEST 1 VIEW COMPARISON:  Chest radiograph 10/11/2018 FINDINGS: Redemonstrated left chest AICD device. Prior median sternotomy with unchanged cardiomegaly. Aortic atherosclerosis. Pulmonary vascular congestion is similar to prior examination. Bilateral pleural effusions (small left, trace right) with associated left basilar atelectasis. No evidence of pneumothorax. No acute bony abnormality. IMPRESSION: No significant interval change as compared to chest radiograph 10/11/2018. Persistent bilateral pleural effusions (small left, trace right) with left basilar atelectasis. Pneumonia at the left lung base cannot be excluded. Unchanged cardiomegaly with pulmonary vascular congestion. Aortic atherosclerosis. Electronically Signed   By: Kellie Simmering DO   On: 05/19/2019 16:06    EKG: Independently reviewed.  Biventricular paced rhythm at 60.   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  He is noted to have anasarca which he states has been present for more than a year.  Anasarca Will order protein creatinine ratio to rule out nephrotic syndrome, TSH also ordered Lasix 40 mg IV twice daily, Daily weights and strict I's and O's Repeat abdominal exam to see if firmness at left lower quadrant is feces Can consider abdominal CT to rule out abdominal mass if other work-up is negative.  Possible pneumonia with recent Covid exposure Chest x-ray cannot rule out infiltrate Patient does have a leukocytosis Covid 19 PCR is pending We will check chest CT to evaluate for possible infiltrates Procalcitonin and CRP ordered Empiric ceftriaxone and azithromycin ordered for tonight, can be discontinued if procalcitonin is less than 0.1  Combined systolic and diastolic heart failure  Appreciate cardiology consultation, they will continue to follow Echocardiogram ordered Lasix 40 mg IV twice daily ordered as noted above with daily weights and strict I's and O's.  Elevated troponin Thought to be secondary to demand ischemia per cardiology, I agree We will follow troponins out every 6 hours x4 Does not need to be reconciled so his cardiac medications can be restarted.  Acute on chronic renal failure Will need to follow her fully as we diurese I do not think he is intravascularly volume depleted given marked edema peripherally.  HTN Hold antihypertensives at present given soft blood pressure, medicine still need to be reconciled  Atrial fibrillation Biventricular paced rhythm No anticoagulation presumably secondary to history of multiple GI bleeds.    Other information:   DVT prophylaxis: Lovenox ordered. Code Status: Full Family Communication: None tonight, wife who is deaf can be contacted in the morning with ASL if  warranted. Disposition Plan: Home Consults called: Cardiology Admission status: Inpatient  [place] [facility]  If 7PM-7AM, please contact night-coverage [email or web address] Password [facility] 05/19/2019, 7:07 PM

## 2019-05-19 NOTE — ED Provider Notes (Signed)
[facility] Emergency Department Provider Note   ____________________________________________   First MD Initiated Contact with Patient 05/19/19 1503     (approximate)  I have reviewed the triage vital signs and the nursing notes.   HISTORY  Chief Complaint Weakness and Leg Swelling  American sign language interpreter utilized.    HPI Thomas [name] is a 85 y.o. male history of coronary disease, CHF, cardiomyopathy   Reports has been having leg swelling and weakness for about a year, but he started to feel very short of breath recently  He also has been feeling achy.  Fatigue.  Reports significant and increasing swelling both lower legs as well as his right scrotum.  No chest pain.  Does report shortness of breath primarily with laying down.  Does have a history of heart failure.  Followed by cardiology.  Denies Covid exposure  Past Medical History:  Diagnosis Date  . AICD (automatic cardioverter/defibrillator) present   . Biventricular ICD (implantable cardioverter-defibrillator) in place 03/24/2005   Implantation of a Med[employer]onic Adapta ADDRO1, serial number [id number]  . CHF (congestive heart failure) ([place])   . CKD (chronic kidney disease), stage III   . Coronary artery disease    a. s/p CABG 1986. b. Multiple PCIs/caths. c. 09/2013: s/p PTCA and BMS to SVG-OM.  [name]   . Dysrhythmia   . History of abdominal aortic aneurysm   . History of bleeding peptic ulcer 1980  . History of epididymitis 2013  . HTN (hypertension)   . Hydronephrosis with ureteropelvic junction obstruction   . Hydroureter on left 2009  . Hypertension   . Ischemic cardiomyopathy    a. Prior EF 30-35%, s/p BIV-ICD. b. 09/2013: EF 45-50%.  . Moderate tricuspid regurgitation   . PAF (paroxysmal atrial fibrillation) ([place])   . Presence of permanent cardiac pacemaker   . Prostate cancer ([place])   . Status post coronary artery bypass grafting 1986   LIMA to the LAD, SVG to OM,  SVG to RCA  . Testicular swelling     Patient Active Problem List   Diagnosis Date Noted  . CRI (chronic renal insufficiency), stage 3 (moderate) 01/30/2019  . Iron deficiency anemia due to chronic blood loss 11/02/2018  . Coronary artery disease of bypass graft of native heart with stable angina pectoris ([place]) 11/02/2018  . Pure hypercholesterolemia 11/02/2018  . Biventricular automatic implantable cardioverter defibrillator in situ 11/02/2018  . GIB (gastrointestinal bleeding) 09/14/2018  . Hypotension 08/14/2018  . Acute on chronic systolic CHF (congestive heart failure) ([place]) 08/12/2018  . Acute on chronic combined systolic and diastolic CHF (congestive heart failure) ([place])   . GI bleed 05/26/2018  . Occult GI bleeding 05/25/2018  . Normocytic anemia 05/25/2018  . Elevated troponin 05/25/2018  . Anemia   . Lymphedema 02/28/2018  . Weakness 07/15/2016  . Fatigue 07/15/2016  . CVA (cerebral infarction) 10/31/2015  . Bulbous urethral stricture 09/18/2015  . Bilateral deafness 08/19/2015  . Bilateral cataracts 08/19/2015  . Acid reflux 08/19/2015  . HLD (hyperlipidemia) 08/19/2015  . Essential hypertension 08/19/2015  . Myocardial infarction ([place]) 08/19/2015  . Calculus of kidney 08/19/2015  . Artificial cardiac pacemaker 08/19/2015  . Gastroduodenal ulcer 08/19/2015  . Dupuytren's contracture of foot 08/19/2015  . Malignant neoplasm of prostate ([place]) 08/19/2015  . Microhematuria 08/19/2015  . CAD S/P multiple PCIs 02/22/2015  . Status post coronary artery bypass grafting 02/22/2015  . Benign essential HTN 04/02/2014  . Hematochezia 02/20/2014  . Tricuspid valve regurgitation, nonrheumatic  02/20/2014  . Mobitz type II atrioventricular block 12/12/2013  . Long term current use of anticoagulant 10/18/2013  . Cardiomyopathy, ischemic 11/11/2012  . CAD Status post coronary artery bypass grafting: 1995 11/11/2012  . Permanent atrial fibrillation ([place]) 11/11/2012  .  Biventricular ICD in place 11/11/2012  . Chronic combined systolic and diastolic heart failure ([place]) 11/11/2012    Past Surgical History:  Procedure Laterality Date  . 2-D echocardiogram  11/20/2011   Ejection fraction 30-35% moderate concentric left ventricular hypertrophy. Left atrium is moderately dilated. Mild MR. Mild or  . BI-VENTRICULAR IMPLANTABLE CARDIOVERTER DEFIBRILLATOR N/A 12/16/2012   Procedure: BI-VENTRICULAR IMPLANTABLE CARDIOVERTER DEFIBRILLATOR  (CRT-D);  Surgeon: [name], MD;  Location: [facility] CATH LAB;  Service: Cardiovascular;  Laterality: N/A;  . CARDIAC CATHETERIZATION  12/10/2011   SVG to OM widely patent.  LIMA to LAD patent  . CATARACT EXTRACTION W/PHACO Right 10/12/2017   Procedure: CATARACT EXTRACTION PHACO AND INTRAOCULAR LENS PLACEMENT (IOC);  Surgeon: Birder Robson, MD;  Location: [facility] ORS;  Service: Ophthalmology;  Laterality: Right;  [place] 00:57 AP% 15.9 CDE 9.07 Fluid pack lot # [id number]  . COLONOSCOPY N/A 07/13/2018   Procedure: COLONOSCOPY;  Surgeon: Toledo, Benay Pike, MD;  Location: [facility] ENDOSCOPY;  Service: Gastroenterology;  Laterality: N/A;  . CORONARY ARTERY BYPASS GRAFT  1986  . ENTEROSCOPY N/A 09/14/2018   Procedure: ENTEROSCOPY;  Surgeon: Toledo, Benay Pike, MD;  Location: [facility] ENDOSCOPY;  Service: Gastroenterology;  Laterality: N/A;  symptomatic anemia, GI blood loss anemia, melena, positive small bowel capsule endoscopy showing source of bleeding   . ESOPHAGOGASTRODUODENOSCOPY N/A 07/13/2018   Procedure: ESOPHAGOGASTRODUODENOSCOPY (EGD);  Surgeon: Toledo, Benay Pike, MD;  Location: [facility] ENDOSCOPY;  Service: Gastroenterology;  Laterality: N/A;  . ESOPHAGOGASTRODUODENOSCOPY N/A 09/14/2018   Procedure: ESOPHAGOGASTRODUODENOSCOPY (EGD);  Surgeon: Toledo, Benay Pike, MD;  Location: [facility] ENDOSCOPY;  Service: Gastroenterology;  Laterality: N/A;  SIGN LANAGUAGE INTERPRETER  . ESOPHAGOGASTRODUODENOSCOPY (EGD) WITH PROPOFOL N/A 05/27/2018   Procedure:  ESOPHAGOGASTRODUODENOSCOPY (EGD) WITH PROPOFOL;  Surgeon: Clarene Essex, MD;  Location: [place];  Service: Endoscopy;  Laterality: N/A;  . INSERT / REPLACE / REMOVE PACEMAKER    . LEFT HEART CATHETERIZATION WITH CORONARY/GRAFT ANGIOGRAM N/A 12/10/2011   Procedure: LEFT HEART CATHETERIZATION WITH Beatrix Fetters;  Surgeon: Sanda Klein, MD;  Location: [place] CATH LAB;  Service: Cardiovascular;  Laterality: N/A;  . LEFT HEART CATHETERIZATION WITH CORONARY/GRAFT ANGIOGRAM N/A 09/25/2013   Procedure: LEFT HEART CATHETERIZATION WITH Beatrix Fetters;  Surgeon: Blane Ohara, MD;  Location: [facility] CATH LAB;  Service: Cardiovascular;  Laterality: N/A;  . Persantine Myoview  05/06/2010   Post-rest ejection fraction 30%. No significant ischemia demonstrated. Compared to previous study there is no significant change.  . TRANSURETHRAL RESECTION OF PROSTATE     s/p    Prior to Admission medications   Medication Sig Start Date End Date Taking? Authorizing Provider  albuterol (VENTOLIN HFA) 108 (90 Base) MCG/ACT inhaler Inhale 2 puffs into the lungs 4 (four) times daily as needed. Patient taking differently: Inhale 2 puffs into the lungs 4 (four) times daily as needed for wheezing or shortness of breath.  08/02/18   Croitoru, Mihai, MD  carvedilol (COREG) 3.125 MG tablet Take 1 tablet (3.125 mg total) by mouth 2 (two) times daily with a meal. 08/17/18   Lavina Hamman, MD  ferrous sulfate 325 (65 FE) MG tablet Take 1 tablet (325 mg total) by mouth daily for 30 days. 05/28/18   Elodia Florence., MD  furosemide (LASIX) 40 MG  tablet Take 2 tablets (80 mg total) by mouth daily. 10/27/18   Croitoru, Mihai, MD  HYDROCORTISONE, TOPICAL, 2 % LOTN Apply 40 oz topically 2 (two) times daily as needed (as needed for itching on foot). APPLY BID TO AFFECTED AREA 08/23/18   Croitoru, [name] Gobble, MD  isosorbide mononitrate (IMDUR) 30 MG 24 hr tablet Take 1 tablet (30 mg total) by mouth daily. 08/18/18   Lavina Hamman, MD  losartan (COZAAR) 25 MG tablet Take 1 tablet (25 mg total) by mouth daily. 11/09/18 02/07/19  Alisa Graff, FNP  metolazone (ZAROXOLYN) 2.5 MG tablet Take 1 tablet (2.5 mg) by mouth on the morning of March 09, 2019. Take 30 minutes before taking Lasix. *ONLY TAKE WHEN INSTRUCTED BY CARDIOLOGIST TO DO SO* 03/09/19   Croitoru, Mihai, MD  pantoprazole (PROTONIX) 40 MG tablet Take 1 tablet (40 mg total) by mouth 2 (two) times daily before a meal. Patient taking differently: Take 40 mg by mouth daily.  09/16/18   Demetrios Loll, MD  simvastatin (ZOCOR) 20 MG tablet TAKE 1 TABLET BY MOUTH EVERY DAY 04/07/19   Erlene Quan, PA-C  vitamin B-12 (CYANOCOBALAMIN) 500 MCG tablet Take 1 tablet (500 mcg total) by mouth daily. 08/18/18   Maisie Fus, MD    Allergies Phenazopyridine, Entresto [sacubitril-valsartan], and Ramipril  Family History  Problem Relation Age of Onset  . Hypertension Father     Social History Social History   Tobacco Use  . Smoking status: Former Smoker    Quit date: 03/15/1985    Years since quitting: 34.2  . Smokeless tobacco: Never Used  Substance Use Topics  . Alcohol use: No    Comment: occas.  . Drug use: No    Review of Systems Constitutional: No fever/chills but fatigue with some body aches Cardiovascular: Denies chest pain. Respiratory: Shortness of breath, worse with laying flat.  Relieved somewhat with oxygen Gastrointestinal: No abdominal pain.   Genitourinary: Negative for dysuria.  Testicle swollen especially over the right side has been ongoing but worsening over a years time Musculoskeletal: Negative for back pain.  Significant swelling in both legs.  Some generalized muscle aches Urologic: See HPI.  No difficulty urinating Skin: Negative for rash. Neurological: Negative for focal weakness.    ____________________________________________   PHYSICAL EXAM:  VITAL SIGNS: ED Triage Vitals [05/19/19 1355]  Enc Vitals Group     BP 100/68       Pulse Rate 66     Resp (!) 22     Temp 98.7 F (37.1 C)     Temp Source Oral     SpO2 94 %     Weight      Height      Head Circumference      Peak Flow      Pain Score 0     Pain Loc      Pain Edu?      Excl. in [place]?     Constitutional: Alert and oriented.  Mildly ill-appearing, somewhat tachypneic.  Increased work of breathing. Eyes: Conjunctivae are normal. Head: Atraumatic. Nose: No congestion/rhinnorhea. Mouth/Throat: Mucous membranes are moist. Neck: No stridor.  Cardiovascular: Normal rate, regular rhythm. Grossly normal heart sounds.  Good peripheral circulation. Respiratory: Mild accessory muscle use.  Diminished lung sounds at bases bilateral.  The patient is deaf.  He does however appear to have accessory muscle use. Gastrointestinal: Soft and nontender. No distention. Musculoskeletal: No lower extremity tenderness has anasarca involving both lower extremities as  well as right greater than left side of the scrotum.  No erythema redness or evidence of skin breakdown to noted Neurologic:  Normal speech and language. No gross focal neurologic deficits are appreciated.  Skin:  Skin is warm, dry and intact. No rash noted. Psychiatric: Mood and affect are normal. Speech and behavior are normal.  ____________________________________________   LABS (all labs ordered are listed, but only abnormal results are displayed)  Labs Reviewed  BASIC METABOLIC PANEL - Abnormal; Notable for the following components:      Result Value   Glucose, Bld 108 (*)    BUN 37 (*)    Creatinine, Ser 1.73 (*)    Calcium 8.0 (*)    GFR calc non Af Amer 36 (*)    GFR calc Af Amer 42 (*)    All other components within normal limits  CBC - Abnormal; Notable for the following components:   WBC 14.2 (*)    RBC 3.55 (*)    Hemoglobin 8.2 (*)    HCT 27.5 (*)    MCV 77.5 (*)    MCH 23.1 (*)    MCHC 29.8 (*)    RDW 21.0 (*)    All other components within normal limits  BRAIN NATRIURETIC  PEPTIDE - Abnormal; Notable for the following components:   B Natriuretic Peptide 633.0 (*)    All other components within normal limits  TROPONIN I (HIGH SENSITIVITY) - Abnormal; Notable for the following components:   Troponin I (High Sensitivity) 266 (*)    All other components within normal limits  TROPONIN I (HIGH SENSITIVITY) - Abnormal; Notable for the following components:   Troponin I (High Sensitivity) 332 (*)    All other components within normal limits  SARS CORONAVIRUS 2 (TAT 6-24 HRS)  HEPATIC FUNCTION PANEL  TSH   ____________________________________________  EKG  Reviewed inter by me at 1500 Heart rate 69 QRS 150 QTc 460 Ventricular paced ____________________________________________  RADIOLOGY  DG Chest Portable 1 View  Result Date: 05/19/2019 CLINICAL DATA:  Dyspnea. EXAM: PORTABLE CHEST 1 VIEW COMPARISON:  Chest radiograph 10/11/2018 FINDINGS: Redemonstrated left chest AICD device. Prior median sternotomy with unchanged cardiomegaly. Aortic atherosclerosis. Pulmonary vascular congestion is similar to prior examination. Bilateral pleural effusions (small left, trace right) with associated left basilar atelectasis. No evidence of pneumothorax. No acute bony abnormality. IMPRESSION: No significant interval change as compared to chest radiograph 10/11/2018. Persistent bilateral pleural effusions (small left, trace right) with left basilar atelectasis. Pneumonia at the left lung base cannot be excluded. Unchanged cardiomegaly with pulmonary vascular congestion. Aortic atherosclerosis. Electronically Signed   By: Kellie Simmering DO   On: 05/19/2019 16:06    Chest x-ray personally viewed by me, small pleural effusions.  No significant interval changes.  Radiologist reports left lung base pneumonia cannot be excluded.  Cardiomegaly with vascular congestion ____________________________________________   PROCEDURES  Procedure(s) performed: None  Procedures  Critical Care  performed: No  ____________________________________________   INITIAL IMPRESSION / ASSESSMENT AND PLAN / ED COURSE  Pertinent labs & imaging results that were available during my care of the patient were reviewed by me and considered in my medical decision making (see chart for details).   Patient presents for concerns of dyspnea as well as significant lower extremity swelling.  Dyspnea beginning recently, with bilateral lower extremity swelling apparently being a longstanding issue.  He appears quite volume overloaded.  Denies acute infectious symptoms except he does note some muscle aches but no known Covid exposure.  He  is afebrile.  Does have leukocytosis, but no overt or obvious signs of infection by my clinical assessment at this time.  Clinical Course as of May 19 1727  Fri May 19, 2019  1517 Cardiology consult placed with Dr. Saunders Revel.   [MQ]  [phone] Cardiology has seen, recommends start with IV lasix. Admit to hospitalist. Cardiology will obtain echo.   [MQ]    Clinical Course User Index [MQ] Delman Kitten, MD   Discussed with hospitalist, will admit for further work-up and care at this time.  ____________________________________________   FINAL CLINICAL IMPRESSION(S) / ED DIAGNOSES  Final diagnoses:  AKI (acute kidney injury) ([place])  Anasarca  Hypoxia        Note:  This document was prepared using Dragon voice recognition software and may include unintentional dictation errors       Delman Kitten, MD 05/19/19 1729

## 2019-05-19 NOTE — ED Provider Notes (Signed)
Hospitalist currently seeing patient, requests we order a noncontrast chest CT for them to review for further diagnostic work-up   Delman Kitten, MD 05/19/19 1806

## 2019-05-19 NOTE — ED Notes (Signed)
Pt repositioned in bed again. Explained next steps using interpreting system.

## 2019-05-19 NOTE — ED Notes (Signed)
Pt's rapid covid test NEGATIVE.

## 2019-05-19 NOTE — ED Notes (Signed)
POC SARS 2 Ag result charted in error by either another nurse or a NT. Pt's is still in process and has about 10 minutes left. This RN will chart results of rapid test momentarily.

## 2019-05-19 NOTE — ED Notes (Signed)
Explained needed to get rapid covid swab. Pt agreeable. Not sure how good of a sample the 11min rapid is as pt kept grabbing and shoving this RN's arm away while attempting to collect sample.

## 2019-05-20 ENCOUNTER — Encounter: Payer: Self-pay | Admitting: Internal Medicine

## 2019-05-20 DIAGNOSIS — N183 Chronic kidney disease, stage 3 unspecified: Secondary | ICD-10-CM

## 2019-05-20 DIAGNOSIS — R601 Generalized edema: Secondary | ICD-10-CM

## 2019-05-20 DIAGNOSIS — K7290 Hepatic failure, unspecified without coma: Secondary | ICD-10-CM

## 2019-05-20 LAB — COMPREHENSIVE METABOLIC PANEL WITH GFR
ALT: 21 U/L (ref 0–44)
AST: 65 U/L — ABNORMAL HIGH (ref 15–41)
Albumin: 1.8 g/dL — ABNORMAL LOW (ref 3.5–5.0)
Alkaline Phosphatase: 97 U/L (ref 38–126)
Anion gap: 12 (ref 5–15)
BUN: 49 mg/dL — ABNORMAL HIGH (ref 8–23)
CO2: 24 mmol/L (ref 22–32)
Calcium: 7.8 mg/dL — ABNORMAL LOW (ref 8.9–10.3)
Chloride: 106 mmol/L (ref 98–111)
Creatinine, Ser: 1.99 mg/dL — ABNORMAL HIGH (ref 0.61–1.24)
GFR calc Af Amer: 35 mL/min — ABNORMAL LOW (ref >60)
GFR calc non Af Amer: 30 mL/min — ABNORMAL LOW (ref >60)
Glucose, Bld: 89 mg/dL (ref 70–99)
Potassium: 4.2 mmol/L (ref 3.5–5.1)
Sodium: 142 mmol/L (ref 135–145)
Total Bilirubin: 0.9 mg/dL (ref 0.3–1.2)
Total Protein: 4.4 g/dL — ABNORMAL LOW (ref 6.5–8.1)

## 2019-05-20 LAB — CBC
HCT: 27 % — ABNORMAL LOW (ref 39.0–52.0)
Hemoglobin: 8 g/dL — ABNORMAL LOW (ref 13.0–17.0)
MCH: 23 pg — ABNORMAL LOW (ref 26.0–34.0)
MCHC: 29.6 g/dL — ABNORMAL LOW (ref 30.0–36.0)
MCV: 77.6 fL — ABNORMAL LOW (ref 80.0–100.0)
Platelets: 250 K/uL (ref 150–400)
RBC: 3.48 MIL/uL — ABNORMAL LOW (ref 4.22–5.81)
RDW: 20.8 % — ABNORMAL HIGH (ref 11.5–15.5)
WBC: 14.4 K/uL — ABNORMAL HIGH (ref 4.0–10.5)
nRBC: 0 % (ref 0.0–0.2)

## 2019-05-20 LAB — TROPONIN I (HIGH SENSITIVITY)
Troponin I (High Sensitivity): 397 ng/L (ref <18)
Troponin I (High Sensitivity): 411 ng/L (ref <18)
Troponin I (High Sensitivity): 339 ng/L (ref <18)

## 2019-05-20 LAB — SARS CORONAVIRUS 2 (TAT 6-24 HRS): SARS Coronavirus 2: NEGATIVE

## 2019-05-20 MED ORDER — SPIRONOLACTONE 25 MG PO TABS
25.0000 mg | ORAL_TABLET | Freq: Two times a day (BID) | ORAL | Status: DC
  Administered 2019-05-20 – 2019-05-22 (×6): 25 mg via ORAL
  Filled 2019-05-20 (×6): qty 1

## 2019-05-20 MED ORDER — OXYCODONE HCL 5 MG PO TABS
5.0000 mg | ORAL_TABLET | Freq: Four times a day (QID) | ORAL | Status: DC | PRN
  Administered 2019-05-20 – 2019-05-28 (×7): 5 mg via ORAL
  Filled 2019-05-20 (×8): qty 1

## 2019-05-20 MED ORDER — ENSURE ENLIVE PO LIQD
237.0000 mL | Freq: Three times a day (TID) | ORAL | Status: DC
  Administered 2019-05-20 – 2019-05-29 (×21): 237 mL via ORAL

## 2019-05-20 MED ORDER — ADULT MULTIVITAMIN W/MINERALS CH
1.0000 | ORAL_TABLET | Freq: Every day | ORAL | Status: DC
  Administered 2019-05-20 – 2019-05-29 (×10): 1 via ORAL
  Filled 2019-05-20 (×10): qty 1

## 2019-05-20 NOTE — Progress Notes (Signed)
Received pt from ED via stretcher.  Pt is deaf and communicating with him via pen/paper.  Oriented to surroundings and call bell system, acknowledged instructions to use call bell for assistance getting out of bed or for other concerns.

## 2019-05-20 NOTE — Plan of Care (Signed)
  Problem: Clinical Measurements: Goal: Ability to maintain clinical measurements within normal limits will improve Outcome: Not Progressing Note: B.U.N. is nearly 50. Will continue to monitor renal labs for the remainder of the shift. Patient for both an ultrasound of the liver and abdomen at some point. Thomas [name] [facility]

## 2019-05-20 NOTE — Plan of Care (Signed)
  Problem: Clinical Measurements: Goal: Ability to maintain clinical measurements within normal limits will improve Outcome: Progressing Goal: Will remain free from infection Outcome: Progressing Goal: Diagnostic test results will improve Outcome: Progressing   Problem: Pain Managment: Goal: General experience of comfort will improve Outcome: Progressing   Problem: Safety: Goal: Ability to remain free from injury will improve Outcome: Progressing   Problem: Skin Integrity: Goal: Risk for impaired skin integrity will decrease Outcome: Progressing   Problem: Education: Goal: Ability to demonstrate management of disease process will improve Outcome: Progressing Goal: Ability to verbalize understanding of medication therapies will improve Outcome: Progressing Goal: Individualized Educational Video(s) Outcome: Progressing   Problem: Activity: Goal: Capacity to carry out activities will improve Outcome: Progressing   Problem: Cardiac: Goal: Ability to achieve and maintain adequate cardiopulmonary perfusion will improve Outcome: Progressing

## 2019-05-20 NOTE — Progress Notes (Signed)
Have checked on patient multiple times today, sleeping every time in chair. Will continue to monitor. Wenda Low [facility]

## 2019-05-20 NOTE — ED Notes (Signed)
I spoke with the patient's spouse over the phone with the assistance of a non-medical ASL interpreter. Patient's spouse reports patient currently takes medications listed in the PTA medication list. I have noted various potential discrepancies between the reported medications and the pharmacy dispense record as the conversation was challenging.  ** The above is intended solely for informational and/or communicative purposes. It should in no way be considered an endorsement of any specific treatment, therapy or action. **

## 2019-05-20 NOTE — Progress Notes (Signed)
Initial Nutrition Assessment  **RD working remotely**  DOCUMENTATION CODES:   Not applicable  INTERVENTION:  Ensure Enlive po TID, each supplement provides 350 kcal and 20 grams of protein  MVI daily  Encourage adequate PO intake.     NUTRITION DIAGNOSIS:   Inadequate oral intake related to lethargy/confusion as evidenced by other (comment)(per RN report).    GOAL:   Patient will meet greater than or equal to 90% of their needs    MONITOR:   PO intake, Supplement acceptance, Weight trends, I & O's, Labs  REASON FOR ASSESSMENT:   Malnutrition Screening Tool    ASSESSMENT:   Pt is deaf with a PMH significant for HTN, CAD s/p CABG with multiple stens, CHF, permanent A. Fib, biventricular pacemaker, lymphedema presented with lower extremity swelling and orthopnea. Pt admitted with anasarca.  Of note, pt's wife was diagnosed with COVID 2 weeks ago.   Pt requires sign language interpreter for communication. Since RD is working remotely today, unable to speak with pt to obtain history.   No PO intake documented. Per RN, pt has not consumed much PO due to sleeping.   Pt is noted to have had a 7.4% wt loss x4 months, which is not significant for time frame. However, fluid status could be masking weight loss.   Medications reviewed and include: Lasix, IV abx  Labs reviewed: BUN/Cr 49/1.99 (H), AST 65 (H)  UOP: 555ml x24 hours I/O: -812ml since admit  NUTRITION - FOCUSED PHYSICAL EXAM:  RD unable to perform at this time.   Diet Order:   Diet Order            Diet 2 gram sodium Room service appropriate? Yes; Fluid consistency: Thin  Diet effective now              EDUCATION NEEDS:   Not appropriate for education at this time  Skin:  Skin Assessment: Skin Integrity Issues: Skin Integrity Issues:: Other (Comment) Other: closed blisters on bilateral legs  Last BM:  unknown  Height:   Ht Readings from Last 1 Encounters:  05/19/19 6\' 2"  (1.88 m)     Weight:   Wt Readings from Last 10 Encounters:  05/20/19 95.2 kg  01/30/19 103 kg  11/07/18 108.5 kg  10/27/18 109.1 kg  10/11/18 105.2 kg  09/16/18 102.7 kg  09/05/18 104.9 kg  08/29/18 105 kg  08/17/18 100.3 kg  07/20/18 107 kg     BMI:  Body mass index is 26.94 kg/m.  Estimated Nutritional Needs:   Kcal:  2000-2200  Protein:  110-125 grams  Fluid:  >/=2L/d   Thomas Ina, MS, RD, LDN RD pager number and weekend/on-call pager number located in [place].

## 2019-05-20 NOTE — Progress Notes (Signed)
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  Covid is negative.  BNP is elevated 638.  Work-up has revealed a cirrhotic liver.  He is getting diuresed with daily weights and I's and O's.  GI and cardiology consulting.  Subjective:  Patient states that he feels much better this morning than he did yesterday.  Although the cough is still present he notes his breathing is better and he generally just feels better overall.  Patient is unable to be more specific.  Patient seen with ASL translator.   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  Patient also has CKD 3.  Anasarca Protein creatinine ratio within normal limits, ruling out nephrotic syndrome Imaging however does show cirrhotic appearing liver which will need to be worked  up. Patient also clearly has decompensated heart failure Continue Lasix 40 mg IV twice daily Weight yesterday 95.6 kg, today 95.2 Net out is 850 cc yesterday Continue Daily weights and strict I's and O's  Cirrhosis New diagnosis of cirrhosis, clearly decompensated  Will need to be careful with diuresis as above especially given CKD 3 in order to avoid hepatorenal syndrome. Order liver ultrasound Will start spironolactone when he 5 twice daily to start with Order hepatitis B and C GI consult for further work-up and management  Acute decompensated combined systolic and diastolic heart failure  Continue Lasix 40 IV twice daily Appreciate cardiology consultation, they will continue to follow Echocardiogram ordered by cardiology and pending He does have moderate sized pleural effusions bilaterally, no need for thoracentesis at present we will try to diurese  Acute on chronic renal failure, CKD 3 Creatinine minimally increased with diuresis, will need to follow closely given new diagnosis of cirrhosis as noted above. Will need to avoid hepatorenal syndrome so diuresis must be judicious.  Possible pneumonia with recent Covid exposure Covid PCR is negative Chest CT does not show any infiltration Although procalcitonin is 2, patient is afebrile and we do have other causes for him to be short of breath with cough, will discontinue ceftriaxone azithromycin which was started empirically yesterday. Follow clinically.  Elevated troponin Thought to be secondary to demand ischemia per cardiology, I agree We will follow troponins out every 6 hours  x4 Medications need to be reconciled so his cardiac medications can be restarted.  Atrial fibrillation Biventricular paced rhythm No anticoagulation presumably secondary to history of multiple GI bleeds.  HTN Hold antihypertensives at present given soft blood pressure  History of GI bleed No evidence of bleeding at present, follow H&H as  warranted Avoid antiplatelets and anticoagulants   DVT prophylaxis: Lovenox Code Status: Full Family Communication: Will attempt to communicate with wife who is also deaf at home Disposition Plan:   Patient is from: Home  Anticipated Discharge Location: Home  Barriers to Discharge: Ongoing decompensated heart failure and decompensated cirrhosis  Is patient medically stable for Discharge: No   Consultants:  Cardiology  GI  Procedures:  None  Antimicrobials:  Ceftriaxone and azithromycin were started empirically yesterday and discontinued today   Exam:  General: Patient in good spirits sitting up in bed with his feet hanging down with massive edema. Eyes: sclera anicteric, conjuctiva mild injection bilaterally CVS: S1-S2, regular 2/6 systolic murmur Respiratory: Small lung volumes with rales at bases with coarse breath sounds.  No wheezes GI: NABS, soft, NT, obese. LE: Massive edema including scrotal edema up to his buttocks..  Neuro: A/O x 3, Moving all extremities equally with normal strength, CN 3-12 intact, grossly nonfocal.  Psych: patient is logical and coherent, judgment and insight appear poor, mood and affect are hopeful and appreciative.   Data Reviewed: Basic Metabolic Panel: Recent Labs  Lab 05/19/19 1404 05/19/19 2047 05/20/19 0637  NA 139  --  142  K 4.1  --  4.2  CL 105  --  106  CO2 26  --  24  GLUCOSE 108*  --  89  BUN 37*  --  49*  CREATININE 1.73* 1.98* 1.99*  CALCIUM 8.0*  --  7.8*   Liver Function Tests: Recent Labs  Lab 05/19/19 2047 05/20/19 0637  AST 48* 65*  ALT 17 21  ALKPHOS 95 97  BILITOT 0.9 0.9  PROT 4.4* 4.4*  ALBUMIN 1.7* 1.8*   No results for input(s): LIPASE, AMYLASE in the last 168 hours. No results for input(s): AMMONIA in the last 168 hours. CBC: Recent Labs  Lab 05/19/19 1404 05/20/19 0637  WBC 14.2* 14.4*  HGB 8.2* 8.0*  HCT 27.5* 27.0*  MCV 77.5* 77.6*  PLT 283 250   Cardiac Enzymes: No results  for input(s): CKTOTAL, CKMB, CKMBINDEX, TROPONINI in the last 168 hours. BNP (last 3 results) No results for input(s): PROBNP in the last 8760 hours. CBG: No results for input(s): GLUCAP in the last 168 hours.  Recent Results (from the past 240 hour(s))  SARS CORONAVIRUS 2 (TAT 6-24 HRS) Nasopharyngeal Nasopharyngeal Swab     Status: None   Collection Time: 05/19/19  3:53 PM   Specimen: Nasopharyngeal Swab  Result Value Ref Range Status   SARS Coronavirus 2 NEGATIVE NEGATIVE Final    Comment: (NOTE) SARS-CoV-2 target nucleic acids are NOT DETECTED. The SARS-CoV-2 RNA is generally detectable in upper and lower respiratory specimens during the acute phase of infection. Negative results do not preclude SARS-CoV-2 infection, do not rule out co-infections with other pathogens, and should not be used as the sole basis for treatment or other patient management decisions. Negative results must be combined with clinical observations, patient history, and epidemiological information. The expected result is Negative. Fact Sheet for Patients: SugarRoll.be Fact Sheet for Healthcare Providers: https://www.woods-mathews.com/ This test is not yet approved or cleared by the [place] FDA and  has been authorized for  detection and/or diagnosis of SARS-CoV-2 by FDA under an Emergency Use Authorization (EUA). This EUA will remain  in effect (meaning this test can be used) for the duration of the COVID-19 declaration under Section 56 4(b)(1) of the Act, 21 U.S.C. section 360bbb-3(b)(1), unless the authorization is terminated or revoked sooner. Performed at [facility], [place] [address], [place], [place] [address]       Studies: CT Chest Wo Contrast  Result Date: 05/19/2019 CLINICAL DATA:  Shortness of breath. EXAM: CT CHEST WITHOUT CONTRAST TECHNIQUE: Multidetector CT imaging of the chest was performed following the standard protocol without IV  contrast. COMPARISON:  None recent FINDINGS: Cardiovascular: The heart size is significantly enlarged. Advanced coronary artery calcifications are noted. The patient is status post prior median sternotomy. Atherosclerotic changes are noted of the thoracic aorta. There is a thoracic aortic aneurysm of the ascending aorta measuring approximately 4.7 cm. There is no significant pericardial effusion. Mediastinum/Nodes: --No mediastinal or hilar lymphadenopathy. --No axillary lymphadenopathy. --No supraclavicular lymphadenopathy. --Normal thyroid gland. --The esophagus is unremarkable Lungs/Pleura: There are moderate-sized bilateral pleural effusions. The lung fields are poorly evaluated secondary to extensive respiratory motion artifact. There is atelectasis at the lung bases bilaterally. There is atelectasis in the lingula. The trachea is unremarkable. There is no pneumothorax. There is slightly prominent interstitial lung markings. Upper Abdomen: The liver appears cirrhotic. There is a cyst within the left hepatic lobe which is essentially stable from prior studies. Musculoskeletal: No chest wall abnormality. No acute or significant osseous findings. IMPRESSION: 1. Cardiomegaly with moderate-sized bilateral pleural effusions in adjacent compressive atelectasis. 2. Aneurysmal dilatation of the ascending aorta measuring approximately 4.7 cm. Ascending thoracic aortic aneurysm. Recommend semi-annual imaging followup by CTA or MRA and referral to cardiothoracic surgery if not already obtained. This recommendation follows 2010 ACCF/AHA/AATS/ACR/ASA/SCA/SCAI/SIR/STS/SVM Guidelines for the Diagnosis and Management of Patients With Thoracic Aortic Disease. Circulation. 2010; [id number]. Aortic aneurysm NOS (ICD10-I71.9) 3. Cirrhotic appearing liver. Aortic Atherosclerosis (ICD10-I70.0). Electronically Signed   By: Constance Holster M.D.   On: 05/19/2019 19:12   DG Chest Portable 1 View  Result Date:  05/19/2019 CLINICAL DATA:  Dyspnea. EXAM: PORTABLE CHEST 1 VIEW COMPARISON:  Chest radiograph 10/11/2018 FINDINGS: Redemonstrated left chest AICD device. Prior median sternotomy with unchanged cardiomegaly. Aortic atherosclerosis. Pulmonary vascular congestion is similar to prior examination. Bilateral pleural effusions (small left, trace right) with associated left basilar atelectasis. No evidence of pneumothorax. No acute bony abnormality. IMPRESSION: No significant interval change as compared to chest radiograph 10/11/2018. Persistent bilateral pleural effusions (small left, trace right) with left basilar atelectasis. Pneumonia at the left lung base cannot be excluded. Unchanged cardiomegaly with pulmonary vascular congestion. Aortic atherosclerosis. Electronically Signed   By: Kellie Simmering DO   On: 05/19/2019 16:06     Scheduled Meds: . enoxaparin (LOVENOX) injection  40 mg Subcutaneous Q24H  . furosemide  40 mg Intravenous Q12H  . sodium chloride flush  3 mL Intravenous Q12H   Continuous Infusions: . sodium chloride Stopped (05/20/19 0542)  . azithromycin Stopped (05/20/19 0214)  . cefTRIAXone (ROCEPHIN)  IV Stopped (05/20/19 0050)    Principal Problem:   Anasarca Active Problems:   CAD Status post coronary artery bypass grafting: 1995   Permanent atrial fibrillation (HCC)   Chronic combined systolic and diastolic heart failure (HCC)   Benign essential HTN   CAD S/P multiple PCIs   Elevated troponin   Acute on chronic systolic CHF (congestive heart failure) (HCC)   CRI (chronic renal insufficiency),  stage 3 (moderate)     Dewaine Oats Derek Jack, MD, FACP, [facility]  If 7PM-7AM, please contact night-coverage [email or web address] Password [email or web address] 05/20/2019, 1:17 PM    LOS: 1 day

## 2019-05-20 NOTE — Progress Notes (Signed)
Progress Note  Patient Name: Thomas [name] Date of Encounter: 05/20/2019  Primary Cardiologist: Sanda Klein, MD , [name]  Subjective   No acute complaints today, though translation is difficult.  He has been urinating well.    Inpatient Medications    Scheduled Meds: . enoxaparin (LOVENOX) injection  40 mg Subcutaneous Q24H  . furosemide  40 mg Intravenous Q12H  . sodium chloride flush  3 mL Intravenous Q12H   Continuous Infusions: . sodium chloride Stopped (05/20/19 0542)  . azithromycin Stopped (05/20/19 0214)  . cefTRIAXone (ROCEPHIN)  IV Stopped (05/20/19 0050)   PRN Meds: sodium chloride, acetaminophen **OR** acetaminophen, bisacodyl, oxyCODONE, polyethylene glycol, sodium chloride flush   Vital Signs    Vitals:   05/20/19 0356 05/20/19 0836 05/20/19 1157 05/20/19 1205  BP: (!) 101/45 (!) 110/58 (!) 98/58 (!) 111/58  Pulse: 60 67 64 64  Resp: 20 19 18    Temp: 98.3 F (36.8 C) 98.1 F (36.7 C) 97.8 F (36.6 C)   TempSrc: Oral Oral Oral   SpO2: 97% 94% 100%   Weight: 95.2 kg     Height:        Intake/Output Summary (Last 24 hours) at 05/20/2019 1301 Last data filed at 05/20/2019 1157 Gross per 24 hour  Intake --  Output 825 ml  Net -825 ml   Last 3 Weights 05/20/2019 05/19/2019 01/30/2019  Weight (lbs) 209 lb 12.8 oz 210 lb 11.2 oz 227 lb  Weight (kg) 95.165 kg 95.573 kg 102.967 kg      Telemetry    Sinus rhythm, ventricular paced- Personally Reviewed  ECG    None new- Personally Reviewed  Physical Exam   GEN: No acute distress.   Neck: No JVD Cardiac: RRR, no murmurs, rubs, or gallops.  Respiratory:  Rhonchi throughout GI: Soft, nontender, non-distended  MS: No edema; No deformity. Neuro:  Nonfocal  Psych: Normal affect   Labs    High Sensitivity Troponin:   Recent Labs  Lab 05/19/19 1404 05/19/19 1553 05/19/19 2047 05/20/19 0054 05/20/19 0637  TROPONINIHS 266* 332* 390* 397* 411*      Chemistry Recent Labs  Lab 05/19/19 1404  05/19/19 2047 05/20/19 0637  NA 139  --  142  K 4.1  --  4.2  CL 105  --  106  CO2 26  --  24  GLUCOSE 108*  --  89  BUN 37*  --  49*  CREATININE 1.73* 1.98* 1.99*  CALCIUM 8.0*  --  7.8*  PROT  --  4.4* 4.4*  ALBUMIN  --  1.7* 1.8*  AST  --  48* 65*  ALT  --  17 21  ALKPHOS  --  95 97  BILITOT  --  0.9 0.9  GFRNONAA 36* 31* 30*  GFRAA 42* 35* 35*  ANIONGAP 8  --  12     Hematology Recent Labs  Lab 05/19/19 1404 05/20/19 0637  WBC 14.2* 14.4*  RBC 3.55* 3.48*  HGB 8.2* 8.0*  HCT 27.5* 27.0*  MCV 77.5* 77.6*  MCH 23.1* 23.0*  MCHC 29.8* 29.6*  RDW 21.0* 20.8*  PLT 283 250    BNP Recent Labs  Lab 05/19/19 1404  BNP 633.0*     DDimer No results for input(s): DDIMER in the last 168 hours.   Radiology    CT Chest Wo Contrast  Result Date: 05/19/2019 CLINICAL DATA:  Shortness of breath. EXAM: CT CHEST WITHOUT CONTRAST TECHNIQUE: Multidetector CT imaging of the chest was performed following  the standard protocol without IV contrast. COMPARISON:  None recent FINDINGS: Cardiovascular: The heart size is significantly enlarged. Advanced coronary artery calcifications are noted. The patient is status post prior median sternotomy. Atherosclerotic changes are noted of the thoracic aorta. There is a thoracic aortic aneurysm of the ascending aorta measuring approximately 4.7 cm. There is no significant pericardial effusion. Mediastinum/Nodes: --No mediastinal or hilar lymphadenopathy. --No axillary lymphadenopathy. --No supraclavicular lymphadenopathy. --Normal thyroid gland. --The esophagus is unremarkable Lungs/Pleura: There are moderate-sized bilateral pleural effusions. The lung fields are poorly evaluated secondary to extensive respiratory motion artifact. There is atelectasis at the lung bases bilaterally. There is atelectasis in the lingula. The trachea is unremarkable. There is no pneumothorax. There is slightly prominent interstitial lung markings. Upper Abdomen: The liver  appears cirrhotic. There is a cyst within the left hepatic lobe which is essentially stable from prior studies. Musculoskeletal: No chest wall abnormality. No acute or significant osseous findings. IMPRESSION: 1. Cardiomegaly with moderate-sized bilateral pleural effusions in adjacent compressive atelectasis. 2. Aneurysmal dilatation of the ascending aorta measuring approximately 4.7 cm. Ascending thoracic aortic aneurysm. Recommend semi-annual imaging followup by CTA or MRA and referral to cardiothoracic surgery if not already obtained. This recommendation follows 2010 ACCF/AHA/AATS/ACR/ASA/SCA/SCAI/SIR/STS/SVM Guidelines for the Diagnosis and Management of Patients With Thoracic Aortic Disease. Circulation. 2010; [id number]. Aortic aneurysm NOS (ICD10-I71.9) 3. Cirrhotic appearing liver. Aortic Atherosclerosis (ICD10-I70.0). Electronically Signed   By: Constance Holster M.D.   On: 05/19/2019 19:12   DG Chest Portable 1 View  Result Date: 05/19/2019 CLINICAL DATA:  Dyspnea. EXAM: PORTABLE CHEST 1 VIEW COMPARISON:  Chest radiograph 10/11/2018 FINDINGS: Redemonstrated left chest AICD device. Prior median sternotomy with unchanged cardiomegaly. Aortic atherosclerosis. Pulmonary vascular congestion is similar to prior examination. Bilateral pleural effusions (small left, trace right) with associated left basilar atelectasis. No evidence of pneumothorax. No acute bony abnormality. IMPRESSION: No significant interval change as compared to chest radiograph 10/11/2018. Persistent bilateral pleural effusions (small left, trace right) with left basilar atelectasis. Pneumonia at the left lung base cannot be excluded. Unchanged cardiomegaly with pulmonary vascular congestion. Aortic atherosclerosis. Electronically Signed   By: Kellie Simmering DO   On: 05/19/2019 16:06    Cardiac Studies   TTE 05/02/18 1. The left ventricle has moderately reduced systolic function, with an  ejection fraction of 35-40%. The cavity  size was moderately dilated. Left  ventricular diastology could not be evaluated.  2. The right ventricle has mildly reduced systolic function. The cavity  was moderately enlarged. There is no increase in right ventricular wall  thickness.  3. Left atrial size was severely dilated.  4. Right atrial size was severely dilated.  5. The mitral valve is normal in structure. Mitral valve regurgitation is  mild to moderate by color flow Doppler. The MR jet is eccentric  posteriorly directed.  6. The tricuspid valve is normal in structure.  7. The aortic valve is tricuspid Aortic valve regurgitation is mild by  color flow Doppler.  8. The pulmonic valve was normal in structure.  9. There is moderate dilatation of the aortic root and of the ascending  aorta.  10. Moderately dilated pulmonary artery.  11. Pulmonary hypertension is moderate.  12. The inferior vena cava was dilated in size with <50% respiratory  variability.  13. Right atrial pressure is estimated at 15 mmHg.   Patient Profile     85 y.o. male history of coronary disease status post CABG with multiple stents, chronic systolic and diastolic heart failure, status post  [employer] CRT-D, permanent atrial fibrillation not anticoagulated due to GI bleeds who presented to the hospital with lower extremity swelling and orthopnea.  Assessment & Plan    1.  Acute on chronic systolic heart failure due to ischemic cardiomyopathy: Currently getting IV diuresis.  Net out 825 cc.  He continues to have rhonchi throughout his lungs.  Currently holding goal-directed medical therapy in the setting of relative hypotension.  Would continue diuresis.  2.  Coronary artery disease status post CABG with multiple stents: Troponin elevation likely due to demand ischemia.  No current chest pain.  3.  Hypertension: Blood pressures remain low.  Holding optimal medical therapy for heart failure.  4.  Acute renal failure: Unfortunately creatinine  remains elevated.  Would continue with IV diuresis to reassess whether or not this Obrian Bulson improve kidney function, and whether volume overload is causing dysfunction.     For questions or updates, please contact [place] Please consult [email or web address] for contact info under        Signed, Ronetta Molla Meredith Leeds, MD  05/20/2019, 1:01 PM

## 2019-05-21 ENCOUNTER — Inpatient Hospital Stay: Payer: PPO

## 2019-05-21 ENCOUNTER — Inpatient Hospital Stay (HOSPITAL_COMMUNITY)
Admit: 2019-05-21 | Discharge: 2019-05-21 | Disposition: A | Discharge status: Home / Self Care | Payer: PPO | Attending: Internal Medicine | Admitting: Internal Medicine

## 2019-05-21 DIAGNOSIS — I340 Nonrheumatic mitral (valve) insufficiency: Secondary | ICD-10-CM

## 2019-05-21 DIAGNOSIS — K7689 Other specified diseases of liver: Secondary | ICD-10-CM

## 2019-05-21 DIAGNOSIS — I361 Nonrheumatic tricuspid (valve) insufficiency: Secondary | ICD-10-CM

## 2019-05-21 DIAGNOSIS — K7469 Other cirrhosis of liver: Secondary | ICD-10-CM

## 2019-05-21 LAB — CBC WITH DIFFERENTIAL/PLATELET
Abs Immature Granulocytes: 0.04 10*3/uL (ref 0.00–0.07)
Basophils Absolute: 0 10*3/uL (ref 0.0–0.1)
Basophils Relative: 0 %
Eosinophils Absolute: 0 10*3/uL (ref 0.0–0.5)
Eosinophils Relative: 0 %
HCT: 25.6 % — ABNORMAL LOW (ref 39.0–52.0)
Hemoglobin: 7.6 g/dL — ABNORMAL LOW (ref 13.0–17.0)
Immature Granulocytes: 0 %
Lymphocytes Relative: 10 %
Lymphs Abs: 0.9 10*3/uL (ref 0.7–4.0)
MCH: 22.7 pg — ABNORMAL LOW (ref 26.0–34.0)
MCHC: 29.7 g/dL — ABNORMAL LOW (ref 30.0–36.0)
MCV: 76.4 fL — ABNORMAL LOW (ref 80.0–100.0)
Monocytes Absolute: 1 10*3/uL (ref 0.1–1.0)
Monocytes Relative: 11 %
Neutro Abs: 6.9 10*3/uL (ref 1.7–7.7)
Neutrophils Relative %: 79 %
Platelets: 227 10*3/uL (ref 150–400)
RBC: 3.35 MIL/uL — ABNORMAL LOW (ref 4.22–5.81)
RDW: 21 % — ABNORMAL HIGH (ref 11.5–15.5)
WBC: 9 10*3/uL (ref 4.0–10.5)
nRBC: 0 % (ref 0.0–0.2)

## 2019-05-21 LAB — ECHOCARDIOGRAM COMPLETE
Height: 74 in
Weight: 3635.2 oz

## 2019-05-21 LAB — COMPREHENSIVE METABOLIC PANEL
ALT: 23 U/L (ref 0–44)
AST: 65 U/L — ABNORMAL HIGH (ref 15–41)
Albumin: 1.7 g/dL — ABNORMAL LOW (ref 3.5–5.0)
Alkaline Phosphatase: 82 U/L (ref 38–126)
Anion gap: 12 (ref 5–15)
BUN: 61 mg/dL — ABNORMAL HIGH (ref 8–23)
CO2: 26 mmol/L (ref 22–32)
Calcium: 7.8 mg/dL — ABNORMAL LOW (ref 8.9–10.3)
Chloride: 103 mmol/L (ref 98–111)
Creatinine, Ser: 1.96 mg/dL — ABNORMAL HIGH (ref 0.61–1.24)
GFR calc Af Amer: 36 mL/min — ABNORMAL LOW (ref >60)
GFR calc non Af Amer: 31 mL/min — ABNORMAL LOW (ref >60)
Glucose, Bld: 135 mg/dL — ABNORMAL HIGH (ref 70–99)
Potassium: 3.9 mmol/L (ref 3.5–5.1)
Sodium: 141 mmol/L (ref 135–145)
Total Bilirubin: 0.6 mg/dL (ref 0.3–1.2)
Total Protein: 4.5 g/dL — ABNORMAL LOW (ref 6.5–8.1)

## 2019-05-21 LAB — HEPATITIS B SURFACE ANTIGEN: Hepatitis B Surface Ag: NONREACTIVE

## 2019-05-21 LAB — PROTIME-INR
INR: 1.4 — ABNORMAL HIGH (ref 0.8–1.2)
Prothrombin Time: 17 seconds — ABNORMAL HIGH (ref 11.4–15.2)

## 2019-05-21 LAB — HEPATITIS C ANTIBODY: HCV Ab: NONREACTIVE

## 2019-05-21 MED ORDER — PERFLUTREN LIPID MICROSPHERE
1.0000 mL | INTRAVENOUS | Status: AC | PRN
  Administered 2019-05-21: 4 mL via INTRAVENOUS
  Filled 2019-05-21: qty 10

## 2019-05-21 NOTE — Progress Notes (Signed)
*  PRELIMINARY RESULTS* Echocardiogram 2D Echocardiogram has been performed. Definity IV Contrast used on this study.  Thomas [name] 05/21/2019, 3:15 PM

## 2019-05-21 NOTE — Progress Notes (Signed)
Progress Note  Patient Name: CARNEY MERCADEL Date of Encounter: 05/21/2019  Primary Cardiologist: Sanda Klein, MD , [name]  Subjective   No acute complaints today.  Translation remains difficult due to deafness.  Inpatient Medications    Scheduled Meds: . enoxaparin (LOVENOX) injection  40 mg Subcutaneous Q24H  . feeding supplement (ENSURE ENLIVE)  237 mL Oral TID BM  . furosemide  40 mg Intravenous Q12H  . multivitamin with minerals  1 tablet Oral Daily  . sodium chloride flush  3 mL Intravenous Q12H  . spironolactone  25 mg Oral BID   Continuous Infusions: . sodium chloride Stopped (05/20/19 0542)   PRN Meds: sodium chloride, acetaminophen **OR** acetaminophen, bisacodyl, oxyCODONE, polyethylene glycol, sodium chloride flush   Vital Signs    Vitals:   05/20/19 1920 05/21/19 0359 05/21/19 0726 05/21/19 1117  BP: (!) 97/53 (!) 100/58 114/70 (!) 107/45  Pulse: 60 60 65 60  Resp: 20 20 18 18   Temp: 98 F (36.7 C) 98 F (36.7 C) 97.8 F (36.6 C) (!) 97.5 F (36.4 C)  TempSrc: Oral Oral Oral Oral  SpO2: 93% 92% 100% 97%  Weight:  103.1 kg    Height:        Intake/Output Summary (Last 24 hours) at 05/21/2019 1254 Last data filed at 05/21/2019 1249 Gross per 24 hour  Intake 3 ml  Output 1925 ml  Net -1922 ml   Last 3 Weights 05/21/2019 05/20/2019 05/19/2019  Weight (lbs) 227 lb 3.2 oz 209 lb 12.8 oz 210 lb 11.2 oz  Weight (kg) 103.057 kg 95.165 kg 95.573 kg      Telemetry    Sinus rhythm, ventricular paced, 1 episode nonsustained VT- Personally Reviewed  ECG    None new- Personally Reviewed  Physical Exam   GEN: Well nourished, well developed, in no acute distress  HEENT: normal  Neck: no JVD, carotid bruits, or masses Cardiac: RRR; no murmurs, rubs, or gallops,no edema  Respiratory: Rhonchi GI: soft, nontender, nondistended, + BS MS: no deformity or atrophy  Skin: warm and dry,  Neuro:  Strength and sensation are intact Psych: euthymic mood, full  affect   Labs    High Sensitivity Troponin:   Recent Labs  Lab 05/19/19 1553 05/19/19 2047 05/20/19 0054 05/20/19 0637 05/20/19 1321  TROPONINIHS 332* 390* 397* 411* 339*      Chemistry Recent Labs  Lab 05/19/19 1404 05/19/19 1404 05/19/19 2047 05/20/19 0637 05/21/19 0426  NA 139  --   --  142 141  K 4.1  --   --  4.2 3.9  CL 105  --   --  106 103  CO2 26  --   --  24 26  GLUCOSE 108*  --   --  89 135*  BUN 37*  --   --  49* 61*  CREATININE 1.73*   < > 1.98* 1.99* 1.96*  CALCIUM 8.0*  --   --  7.8* 7.8*  PROT  --   --  4.4* 4.4* 4.5*  ALBUMIN  --   --  1.7* 1.8* 1.7*  AST  --   --  48* 65* 65*  ALT  --   --  17 21 23   ALKPHOS  --   --  95 97 82  BILITOT  --   --  0.9 0.9 0.6  GFRNONAA 36*   < > 31* 30* 31*  GFRAA 42*   < > 35* 35* 36*  ANIONGAP 8  --   --  12 12   < > = values in this interval not displayed.     Hematology Recent Labs  Lab 05/19/19 1404 05/20/19 0637 05/21/19 0426  WBC 14.2* 14.4* 9.0  RBC 3.55* 3.48* 3.35*  HGB 8.2* 8.0* 7.6*  HCT 27.5* 27.0* 25.6*  MCV 77.5* 77.6* 76.4*  MCH 23.1* 23.0* 22.7*  MCHC 29.8* 29.6* 29.7*  RDW 21.0* 20.8* 21.0*  PLT 283 250 227    BNP Recent Labs  Lab 05/19/19 1404  BNP 633.0*     DDimer No results for input(s): DDIMER in the last 168 hours.   Radiology    CT Chest Wo Contrast  Result Date: 05/19/2019 CLINICAL DATA:  Shortness of breath. EXAM: CT CHEST WITHOUT CONTRAST TECHNIQUE: Multidetector CT imaging of the chest was performed following the standard protocol without IV contrast. COMPARISON:  None recent FINDINGS: Cardiovascular: The heart size is significantly enlarged. Advanced coronary artery calcifications are noted. The patient is status post prior median sternotomy. Atherosclerotic changes are noted of the thoracic aorta. There is a thoracic aortic aneurysm of the ascending aorta measuring approximately 4.7 cm. There is no significant pericardial effusion. Mediastinum/Nodes: --No  mediastinal or hilar lymphadenopathy. --No axillary lymphadenopathy. --No supraclavicular lymphadenopathy. --Normal thyroid gland. --The esophagus is unremarkable Lungs/Pleura: There are moderate-sized bilateral pleural effusions. The lung fields are poorly evaluated secondary to extensive respiratory motion artifact. There is atelectasis at the lung bases bilaterally. There is atelectasis in the lingula. The trachea is unremarkable. There is no pneumothorax. There is slightly prominent interstitial lung markings. Upper Abdomen: The liver appears cirrhotic. There is a cyst within the left hepatic lobe which is essentially stable from prior studies. Musculoskeletal: No chest wall abnormality. No acute or significant osseous findings. IMPRESSION: 1. Cardiomegaly with moderate-sized bilateral pleural effusions in adjacent compressive atelectasis. 2. Aneurysmal dilatation of the ascending aorta measuring approximately 4.7 cm. Ascending thoracic aortic aneurysm. Recommend semi-annual imaging followup by CTA or MRA and referral to cardiothoracic surgery if not already obtained. This recommendation follows 2010 ACCF/AHA/AATS/ACR/ASA/SCA/SCAI/SIR/STS/SVM Guidelines for the Diagnosis and Management of Patients With Thoracic Aortic Disease. Circulation. 2010; [id number]. Aortic aneurysm NOS (ICD10-I71.9) 3. Cirrhotic appearing liver. Aortic Atherosclerosis (ICD10-I70.0). Electronically Signed   By: Constance Holster M.D.   On: 05/19/2019 19:12   DG Chest Portable 1 View  Result Date: 05/19/2019 CLINICAL DATA:  Dyspnea. EXAM: PORTABLE CHEST 1 VIEW COMPARISON:  Chest radiograph 10/11/2018 FINDINGS: Redemonstrated left chest AICD device. Prior median sternotomy with unchanged cardiomegaly. Aortic atherosclerosis. Pulmonary vascular congestion is similar to prior examination. Bilateral pleural effusions (small left, trace right) with associated left basilar atelectasis. No evidence of pneumothorax. No acute bony  abnormality. IMPRESSION: No significant interval change as compared to chest radiograph 10/11/2018. Persistent bilateral pleural effusions (small left, trace right) with left basilar atelectasis. Pneumonia at the left lung base cannot be excluded. Unchanged cardiomegaly with pulmonary vascular congestion. Aortic atherosclerosis. Electronically Signed   By: Kellie Simmering DO   On: 05/19/2019 16:06   [place] ELASTOGRAPHY LIVER  Result Date: 05/21/2019 CLINICAL DATA:  85 year old male with history of cirrhosis presenting with anasarca. EXAM: US ABDOMEN LIMITED - RIGHT UPPER QUADRANT ULTRASOUND HEPATIC ELASTOGRAPHY TECHNIQUE: Sonography of the right upper quadrant was performed. In addition, ultrasound elastography evaluation of the liver was performed. A region of interest was placed within the right lobe of the liver. Following application of a compressive sonographic pulse, tissue compressibility was assessed. Multiple assessments were performed at the selected site. Median tissue compressibility was determined. Previously, hepatic stiffness  was assessed by shear wave velocity. Based on recently published Society of Radiologists in Ultrasound consensus article, reporting is now recommended to be performed in the SI units of pressure (kiloPascals) representing hepatic stiffness/elasticity. The obtained result is compared to the published reference standards. (cACLD = compensated Advanced Chronic Liver Disease) COMPARISON:  None. FINDINGS: ULTRASOUND ABDOMEN LIMITED RIGHT UPPER QUADRANT Gallbladder: No gallstones or wall thickening visualized. No sonographic Murphy sign noted. Common bile duct: Diameter: 4.8 mm Liver: No focal lesion identified, however, the echogenicity of the liver is diffusely heterogeneous and slightly nodular. In the left lobe of the liver there is a 3.1 x 2.4 x 2.5 cm lesion which is generally anechoic with increased through transmission, although there more are multiple internal septations,  compatible with a complex cyst. In the right lobe of the liver there is an additional area of very subtle heterogeneous echogenicity measuring 2.2 x 2.0 cm, suspicious for a solid lesion. Portal vein is patent on color Doppler imaging with normal direction of blood flow towards the liver. ULTRASOUND HEPATIC ELASTOGRAPHY Device: Siemens Helix VTQ Patient position: Supine Transducer 5C1 Number of measurements: 10 Hepatic segment:  8 Median kPa: 1.78 IQR: 0.13 IQR/Median kPa ratio: 0.06 Data quality:  Good Diagnostic category:  < or = 5 kPa: high probability of being normal IMPRESSION: ULTRASOUND RUQ: 1. Mild nodular contour and heterogeneous echogenicity throughout the hepatic parenchyma, suggesting early changes of cirrhosis. 2. Two liver lesions, one of which in the left lobe of the liver appears to represent a complex cyst (as has been demonstrated on prior CT examinations), while the other lesion in the right lobe of the liver is suspicious for a solid lesion and has not been previously demonstrated. Further evaluation with nonemergent abdominal MRI with and without IV gadolinium is recommended in the near future to exclude the possibility of hepatocellular carcinoma. ULTRASOUND HEPATIC ELASTOGRAPHY: Median kPa:  1.78 Diagnostic category:  < or = 5 kPa: high probability of being normal The use of hepatic elastography is applicable to patients with viral hepatitis and non-alcoholic fatty liver disease. At this time, there is insufficient data for the referenced cut-off values and use in other causes of liver disease, including alcoholic liver disease. Patients, however, may be assessed by elastography and serve as their own reference standard/baseline. In patients with non-alcoholic liver disease, the values suggesting compensated advanced chronic liver disease (cACLD) may be lower, and patients may need additional testing with elasticity results of 7-9 kPa. Please note that abnormal hepatic elasticity and shear  wave velocities may also be identified in clinical settings other than with hepatic fibrosis, such as: acute hepatitis, elevated right heart and central venous pressures including use of beta blockers, veno-occlusive disease (Budd-Chiari), infiltrative processes such as mastocytosis/amyloidosis/infiltrative tumor/lymphoma, extrahepatic cholestasis, with hyperemia in the post-prandial state, and with liver transplantation. Correlation with patient history, laboratory data, and clinical condition recommended. Diagnostic Categories: < or =5 kPa: high probability of being normal < or =9 kPa: in the absence of other known clinical signs, rules out cACLD >9 kPa and ?13 kPa: suggestive of cACLD, but needs further testing >13 kPa: highly suggestive of cACLD > or =17 kPa: highly suggestive of cACLD with an increased probability of clinically significant portal hypertension Electronically Signed   By: Vinnie Langton M.D.   On: 05/21/2019 11:18   US Abdomen Limited RUQ  Result Date: 05/21/2019 CLINICAL DATA:  85 year old male with history of cirrhosis presenting with anasarca. EXAM: US ABDOMEN LIMITED - RIGHT UPPER QUADRANT ULTRASOUND HEPATIC  ELASTOGRAPHY TECHNIQUE: Sonography of the right upper quadrant was performed. In addition, ultrasound elastography evaluation of the liver was performed. A region of interest was placed within the right lobe of the liver. Following application of a compressive sonographic pulse, tissue compressibility was assessed. Multiple assessments were performed at the selected site. Median tissue compressibility was determined. Previously, hepatic stiffness was assessed by shear wave velocity. Based on recently published Society of Radiologists in Ultrasound consensus article, reporting is now recommended to be performed in the SI units of pressure (kiloPascals) representing hepatic stiffness/elasticity. The obtained result is compared to the published reference standards. (cACLD = compensated  Advanced Chronic Liver Disease) COMPARISON:  None. FINDINGS: ULTRASOUND ABDOMEN LIMITED RIGHT UPPER QUADRANT Gallbladder: No gallstones or wall thickening visualized. No sonographic Murphy sign noted. Common bile duct: Diameter: 4.8 mm Liver: No focal lesion identified, however, the echogenicity of the liver is diffusely heterogeneous and slightly nodular. In the left lobe of the liver there is a 3.1 x 2.4 x 2.5 cm lesion which is generally anechoic with increased through transmission, although there more are multiple internal septations, compatible with a complex cyst. In the right lobe of the liver there is an additional area of very subtle heterogeneous echogenicity measuring 2.2 x 2.0 cm, suspicious for a solid lesion. Portal vein is patent on color Doppler imaging with normal direction of blood flow towards the liver. ULTRASOUND HEPATIC ELASTOGRAPHY Device: Siemens Helix VTQ Patient position: Supine Transducer 5C1 Number of measurements: 10 Hepatic segment:  8 Median kPa: 1.78 IQR: 0.13 IQR/Median kPa ratio: 0.06 Data quality:  Good Diagnostic category:  < or = 5 kPa: high probability of being normal IMPRESSION: ULTRASOUND RUQ: 1. Mild nodular contour and heterogeneous echogenicity throughout the hepatic parenchyma, suggesting early changes of cirrhosis. 2. Two liver lesions, one of which in the left lobe of the liver appears to represent a complex cyst (as has been demonstrated on prior CT examinations), while the other lesion in the right lobe of the liver is suspicious for a solid lesion and has not been previously demonstrated. Further evaluation with nonemergent abdominal MRI with and without IV gadolinium is recommended in the near future to exclude the possibility of hepatocellular carcinoma. ULTRASOUND HEPATIC ELASTOGRAPHY: Median kPa:  1.78 Diagnostic category:  < or = 5 kPa: high probability of being normal The use of hepatic elastography is applicable to patients with viral hepatitis and  non-alcoholic fatty liver disease. At this time, there is insufficient data for the referenced cut-off values and use in other causes of liver disease, including alcoholic liver disease. Patients, however, may be assessed by elastography and serve as their own reference standard/baseline. In patients with non-alcoholic liver disease, the values suggesting compensated advanced chronic liver disease (cACLD) may be lower, and patients may need additional testing with elasticity results of 7-9 kPa. Please note that abnormal hepatic elasticity and shear wave velocities may also be identified in clinical settings other than with hepatic fibrosis, such as: acute hepatitis, elevated right heart and central venous pressures including use of beta blockers, veno-occlusive disease (Budd-Chiari), infiltrative processes such as mastocytosis/amyloidosis/infiltrative tumor/lymphoma, extrahepatic cholestasis, with hyperemia in the post-prandial state, and with liver transplantation. Correlation with patient history, laboratory data, and clinical condition recommended. Diagnostic Categories: < or =5 kPa: high probability of being normal < or =9 kPa: in the absence of other known clinical signs, rules out cACLD >9 kPa and ?13 kPa: suggestive of cACLD, but needs further testing >13 kPa: highly suggestive of cACLD > or =17  kPa: highly suggestive of cACLD with an increased probability of clinically significant portal hypertension Electronically Signed   By: Vinnie Langton M.D.   On: 05/21/2019 11:18    Cardiac Studies   TTE 05/02/18 1. The left ventricle has moderately reduced systolic function, with an  ejection fraction of 35-40%. The cavity size was moderately dilated. Left  ventricular diastology could not be evaluated.  2. The right ventricle has mildly reduced systolic function. The cavity  was moderately enlarged. There is no increase in right ventricular wall  thickness.  3. Left atrial size was severely dilated.    4. Right atrial size was severely dilated.  5. The mitral valve is normal in structure. Mitral valve regurgitation is  mild to moderate by color flow Doppler. The MR jet is eccentric  posteriorly directed.  6. The tricuspid valve is normal in structure.  7. The aortic valve is tricuspid Aortic valve regurgitation is mild by  color flow Doppler.  8. The pulmonic valve was normal in structure.  9. There is moderate dilatation of the aortic root and of the ascending  aorta.  10. Moderately dilated pulmonary artery.  11. Pulmonary hypertension is moderate.  12. The inferior vena cava was dilated in size with <50% respiratory  variability.  13. Right atrial pressure is estimated at 15 mmHg.   Patient Profile     85 y.o. male history of coronary disease status post CABG with multiple stents, chronic systolic and diastolic heart failure, status post [employer] CRT-D, permanent atrial fibrillation not anticoagulated due to GI bleeds who presented to the hospital with lower extremity swelling and orthopnea.  Assessment & Plan    1.  Acute on chronic systolic heart failure due to ischemic cardiomyopathy: Currently net -2.7 L.  Has diuresed well.  Unfortunately diuresis has slowed and he is currently getting a bladder scan.  He continues to have evidence of volume overload.  Creatinine has remained relatively stable.  Continue with diuresis.   2.  Coronary artery disease status post CABG with multiple stents: Troponin elevation likely due to demand ischemia.  No current chest pain.  3.  Hypertension: Blood pressure remains low.  Holding therapy for heart failure at this time.  4.  Acute renal failure: Creatinine remains elevated.  Has remained stable with IV diuresis.  No changes.     For questions or updates, please contact [place] Please consult [email or web address] for contact info under        Signed, Sircharles Holzheimer Meredith Leeds, MD  05/21/2019, 12:54 PM

## 2019-05-21 NOTE — Progress Notes (Signed)
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  Covid is negative.  BNP is elevated 638.  Work-up has revealed a cirrhotic liver.  He is getting diuresed with daily weights and I's and O's.  GI and cardiology consulting.  Subjective:  Patient seen with ASL translator.  He states he continues to feel better and better.  Thinks his breathing is much better.  Thinks his swelling is down.  He would like for scrotum to be less swollen than it is but is happy with the progress.   Objective: Vitals:   05/21/19 0359 05/21/19 0726 05/21/19 1117 05/21/19 1526  BP: (!) 100/58 114/70 (!) 107/45 97/60  Pulse: 60 65 60 60  Resp: 20 18 18 18   Temp: 98 F (36.7 C) 97.8 F (36.6 C) (!) 97.5 F (36.4 C) 97.9 F (36.6 C)  TempSrc: Oral Oral Oral Oral  SpO2: 92% 100% 97% 94%  Weight: 103.1 kg     Height:        Intake/Output Summary (Last 24 hours) at 05/21/2019 1706 Last data filed at 05/21/2019 1531 Gross per 24 hour  Intake 28.35 ml  Output 1950 ml  Net -1921.65 ml   Filed Weights   05/19/19 2133 05/20/19 0356 05/21/19 0359  Weight: 95.6 kg 95.2 kg 103.1 kg     Assessment & Plan:   85 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  Patient also has CKD 3.  Anasarca Patient continues to diurese reasonably on 40 mg IV twice daily Apparently net loss is 800 cc a day Weight is noted to be 103 kg  today but 95 yesterday, this does not make sense will request reweigh Creatinine is stable Can consider increasing Lasix to 80 twice daily if warranted  Cirrhosis Patient with early cirrhosis on ultrasound. Tolerating spironolactone 25 twice daily without hyperkalemia Continue diuresis If there is ascites, patient will need a tap.  Liver nodule Of note patient seems to have 2 liver nodules, one is cystic but 1 is solid. MRI is recommended however patient has chronic renal insufficiency We will discuss with GI best course of action, consult pending  Acute decompensated combined systolic and diastolic heart failure  Continue Lasix 40 IV twice daily Appreciate cardiology consultation, they will continue to follow Echocardiogram ordered by cardiology and pending He does have moderate sized pleural effusions bilaterally, no need for thoracentesis at present we will try to diurese  Acute on chronic renal failure, CKD 3 Creatinine minimally increased with diuresis, will need to follow closely given new diagnosis of cirrhosis as noted above. Will need to avoid hepatorenal syndrome so diuresis must be judicious.  Possible pneumonia with recent Covid exposure Covid PCR is negative Chest CT does not show any infiltration Although procalcitonin is 2, patient is afebrile and we do have other causes for him to be short of breath with cough, will discontinue ceftriaxone azithromycin which was started empirically yesterday. Follow clinically.  Elevated troponin Thought to be secondary to demand ischemia per  cardiology, I agree We will follow troponins out every 6 hours x4 Medications need to be reconciled so his cardiac medications can be restarted.  Atrial fibrillation Biventricular paced rhythm No anticoagulation presumably secondary to history of multiple GI bleeds.  HTN Hold antihypertensives at present given soft blood pressure  History of GI bleed No evidence of bleeding at  present, follow H&H as warranted Avoid antiplatelets and anticoagulants   DVT prophylaxis: Lovenox Code Status: Full Family Communication: Will attempt to communicate with wife who is also deaf at home Disposition Plan:   Patient is from: Home  Anticipated Discharge Location: Home  Barriers to Discharge: Ongoing decompensated heart failure and decompensated cirrhosis, new diagnosis of solid liver nodule  Is patient medically stable for Discharge: No   Consultants:  Cardiology  GI  Procedures:  None  Antimicrobials:  Ceftriaxone and azithromycin were started empirically yesterday and discontinued today   Exam:  General: Patient in good spirits sitting up in bed with his feet hanging down with massive edema. Eyes: sclera anicteric, conjuctiva mild injection bilaterally CVS: S1-S2, regular 2/6 systolic murmur Respiratory: Small lung volumes with rales at bases with coarse breath sounds.  No wheezes GI: NABS, soft, NT, obese. LE: Massive edema including scrotal edema up to his buttocks..  Neuro: A/O x 3, Moving all extremities equally with normal strength, CN 3-12 intact, grossly nonfocal.  Psych: patient is logical and coherent, judgment and insight appear poor, mood and affect are hopeful and appreciative.   Data Reviewed: Basic Metabolic Panel: Recent Labs  Lab 05/19/19 1404 05/19/19 2047 05/20/19 0637 05/21/19 0426  NA 139  --  142 141  K 4.1  --  4.2 3.9  CL 105  --  106 103  CO2 26  --  24 26  GLUCOSE 108*  --  89 135*  BUN 37*  --  49* 61*  CREATININE 1.73* 1.98* 1.99* 1.96*  CALCIUM 8.0*  --  7.8* 7.8*   Liver Function Tests: Recent Labs  Lab 05/19/19 2047 05/20/19 0637 05/21/19 0426  AST 48* 65* 65*  ALT 17 21 23   ALKPHOS 95 97 82  BILITOT 0.9 0.9 0.6  PROT 4.4* 4.4* 4.5*  ALBUMIN 1.7* 1.8* 1.7*   No results for input(s): LIPASE, AMYLASE in the last 168 hours. No results for input(s): AMMONIA in the last 168 hours. CBC: Recent Labs  Lab  05/19/19 1404 05/20/19 0637 05/21/19 0426  WBC 14.2* 14.4* 9.0  NEUTROABS  --   --  6.9  HGB 8.2* 8.0* 7.6*  HCT 27.5* 27.0* 25.6*  MCV 77.5* 77.6* 76.4*  PLT 283 250 227   Cardiac Enzymes: No results for input(s): CKTOTAL, CKMB, CKMBINDEX, TROPONINI in the last 168 hours. BNP (last 3 results) No results for input(s): PROBNP in the last 8760 hours. CBG: No results for input(s): GLUCAP in the last 168 hours.  Recent Results (from the past 240 hour(s))  SARS CORONAVIRUS 2 (TAT 6-24 HRS) Nasopharyngeal Nasopharyngeal Swab     Status: None   Collection Time: 05/19/19  3:53 PM   Specimen: Nasopharyngeal Swab  Result Value Ref Range Status   SARS Coronavirus 2 NEGATIVE NEGATIVE Final    Comment: (NOTE) SARS-CoV-2 target nucleic acids are NOT DETECTED. The SARS-CoV-2 RNA is generally detectable in upper and lower respiratory specimens during the acute phase of infection. Negative results do not preclude SARS-CoV-2 infection, do not rule out co-infections with other pathogens, and should not be used as the sole basis for treatment or other  patient management decisions. Negative results must be combined with clinical observations, patient history, and epidemiological information. The expected result is Negative. Fact Sheet for Patients: SugarRoll.be Fact Sheet for Healthcare Providers: https://www.woods-mathews.com/ This test is not yet approved or cleared by the [place] FDA and  has been authorized for detection and/or diagnosis of SARS-CoV-2 by FDA under an Emergency Use Authorization (EUA). This EUA will remain  in effect (meaning this test can be used) for the duration of the COVID-19 declaration under Section 56 4(b)(1) of the Act, 21 U.S.C. section 360bbb-3(b)(1), unless the authorization is terminated or revoked sooner. Performed at [facility], [place] [address], [place], [place] [address]       Studies: CT Chest Wo  Contrast  Result Date: 05/19/2019 CLINICAL DATA:  Shortness of breath. EXAM: CT CHEST WITHOUT CONTRAST TECHNIQUE: Multidetector CT imaging of the chest was performed following the standard protocol without IV contrast. COMPARISON:  None recent FINDINGS: Cardiovascular: The heart size is significantly enlarged. Advanced coronary artery calcifications are noted. The patient is status post prior median sternotomy. Atherosclerotic changes are noted of the thoracic aorta. There is a thoracic aortic aneurysm of the ascending aorta measuring approximately 4.7 cm. There is no significant pericardial effusion. Mediastinum/Nodes: --No mediastinal or hilar lymphadenopathy. --No axillary lymphadenopathy. --No supraclavicular lymphadenopathy. --Normal thyroid gland. --The esophagus is unremarkable Lungs/Pleura: There are moderate-sized bilateral pleural effusions. The lung fields are poorly evaluated secondary to extensive respiratory motion artifact. There is atelectasis at the lung bases bilaterally. There is atelectasis in the lingula. The trachea is unremarkable. There is no pneumothorax. There is slightly prominent interstitial lung markings. Upper Abdomen: The liver appears cirrhotic. There is a cyst within the left hepatic lobe which is essentially stable from prior studies. Musculoskeletal: No chest wall abnormality. No acute or significant osseous findings. IMPRESSION: 1. Cardiomegaly with moderate-sized bilateral pleural effusions in adjacent compressive atelectasis. 2. Aneurysmal dilatation of the ascending aorta measuring approximately 4.7 cm. Ascending thoracic aortic aneurysm. Recommend semi-annual imaging followup by CTA or MRA and referral to cardiothoracic surgery if not already obtained. This recommendation follows 2010 ACCF/AHA/AATS/ACR/ASA/SCA/SCAI/SIR/STS/SVM Guidelines for the Diagnosis and Management of Patients With Thoracic Aortic Disease. Circulation. 2010; [id number]. Aortic aneurysm NOS  (ICD10-I71.9) 3. Cirrhotic appearing liver. Aortic Atherosclerosis (ICD10-I70.0). Electronically Signed   By: Constance Holster M.D.   On: 05/19/2019 19:12   [place] ELASTOGRAPHY LIVER  Result Date: 05/21/2019 CLINICAL DATA:  85 year old male with history of cirrhosis presenting with anasarca. EXAM: US ABDOMEN LIMITED - RIGHT UPPER QUADRANT ULTRASOUND HEPATIC ELASTOGRAPHY TECHNIQUE: Sonography of the right upper quadrant was performed. In addition, ultrasound elastography evaluation of the liver was performed. A region of interest was placed within the right lobe of the liver. Following application of a compressive sonographic pulse, tissue compressibility was assessed. Multiple assessments were performed at the selected site. Median tissue compressibility was determined. Previously, hepatic stiffness was assessed by shear wave velocity. Based on recently published Society of Radiologists in Ultrasound consensus article, reporting is now recommended to be performed in the SI units of pressure (kiloPascals) representing hepatic stiffness/elasticity. The obtained result is compared to the published reference standards. (cACLD = compensated Advanced Chronic Liver Disease) COMPARISON:  None. FINDINGS: ULTRASOUND ABDOMEN LIMITED RIGHT UPPER QUADRANT Gallbladder: No gallstones or wall thickening visualized. No sonographic Murphy sign noted. Common bile duct: Diameter: 4.8 mm Liver: No focal lesion identified, however, the echogenicity of the liver is diffusely heterogeneous and slightly nodular. In the left lobe of the liver there is  a 3.1 x 2.4 x 2.5 cm lesion which is generally anechoic with increased through transmission, although there more are multiple internal septations, compatible with a complex cyst. In the right lobe of the liver there is an additional area of very subtle heterogeneous echogenicity measuring 2.2 x 2.0 cm, suspicious for a solid lesion. Portal vein is patent on color Doppler imaging with normal  direction of blood flow towards the liver. ULTRASOUND HEPATIC ELASTOGRAPHY Device: Siemens Helix VTQ Patient position: Supine Transducer 5C1 Number of measurements: 10 Hepatic segment:  8 Median kPa: 1.78 IQR: 0.13 IQR/Median kPa ratio: 0.06 Data quality:  Good Diagnostic category:  < or = 5 kPa: high probability of being normal IMPRESSION: ULTRASOUND RUQ: 1. Mild nodular contour and heterogeneous echogenicity throughout the hepatic parenchyma, suggesting early changes of cirrhosis. 2. Two liver lesions, one of which in the left lobe of the liver appears to represent a complex cyst (as has been demonstrated on prior CT examinations), while the other lesion in the right lobe of the liver is suspicious for a solid lesion and has not been previously demonstrated. Further evaluation with nonemergent abdominal MRI with and without IV gadolinium is recommended in the near future to exclude the possibility of hepatocellular carcinoma. ULTRASOUND HEPATIC ELASTOGRAPHY: Median kPa:  1.78 Diagnostic category:  < or = 5 kPa: high probability of being normal The use of hepatic elastography is applicable to patients with viral hepatitis and non-alcoholic fatty liver disease. At this time, there is insufficient data for the referenced cut-off values and use in other causes of liver disease, including alcoholic liver disease. Patients, however, may be assessed by elastography and serve as their own reference standard/baseline. In patients with non-alcoholic liver disease, the values suggesting compensated advanced chronic liver disease (cACLD) may be lower, and patients may need additional testing with elasticity results of 7-9 kPa. Please note that abnormal hepatic elasticity and shear wave velocities may also be identified in clinical settings other than with hepatic fibrosis, such as: acute hepatitis, elevated right heart and central venous pressures including use of beta blockers, veno-occlusive disease (Budd-Chiari),  infiltrative processes such as mastocytosis/amyloidosis/infiltrative tumor/lymphoma, extrahepatic cholestasis, with hyperemia in the post-prandial state, and with liver transplantation. Correlation with patient history, laboratory data, and clinical condition recommended. Diagnostic Categories: < or =5 kPa: high probability of being normal < or =9 kPa: in the absence of other known clinical signs, rules out cACLD >9 kPa and ?13 kPa: suggestive of cACLD, but needs further testing >13 kPa: highly suggestive of cACLD > or =17 kPa: highly suggestive of cACLD with an increased probability of clinically significant portal hypertension Electronically Signed   By: Vinnie Langton M.D.   On: 05/21/2019 11:18   US Abdomen Limited RUQ  Result Date: 05/21/2019 CLINICAL DATA:  85 year old male with history of cirrhosis presenting with anasarca. EXAM: US ABDOMEN LIMITED - RIGHT UPPER QUADRANT ULTRASOUND HEPATIC ELASTOGRAPHY TECHNIQUE: Sonography of the right upper quadrant was performed. In addition, ultrasound elastography evaluation of the liver was performed. A region of interest was placed within the right lobe of the liver. Following application of a compressive sonographic pulse, tissue compressibility was assessed. Multiple assessments were performed at the selected site. Median tissue compressibility was determined. Previously, hepatic stiffness was assessed by shear wave velocity. Based on recently published Society of Radiologists in Ultrasound consensus article, reporting is now recommended to be performed in the SI units of pressure (kiloPascals) representing hepatic stiffness/elasticity. The obtained result is compared to the published reference standards. (cACLD =  compensated Advanced Chronic Liver Disease) COMPARISON:  None. FINDINGS: ULTRASOUND ABDOMEN LIMITED RIGHT UPPER QUADRANT Gallbladder: No gallstones or wall thickening visualized. No sonographic Murphy sign noted. Common bile duct: Diameter: 4.8 mm  Liver: No focal lesion identified, however, the echogenicity of the liver is diffusely heterogeneous and slightly nodular. In the left lobe of the liver there is a 3.1 x 2.4 x 2.5 cm lesion which is generally anechoic with increased through transmission, although there more are multiple internal septations, compatible with a complex cyst. In the right lobe of the liver there is an additional area of very subtle heterogeneous echogenicity measuring 2.2 x 2.0 cm, suspicious for a solid lesion. Portal vein is patent on color Doppler imaging with normal direction of blood flow towards the liver. ULTRASOUND HEPATIC ELASTOGRAPHY Device: Siemens Helix VTQ Patient position: Supine Transducer 5C1 Number of measurements: 10 Hepatic segment:  8 Median kPa: 1.78 IQR: 0.13 IQR/Median kPa ratio: 0.06 Data quality:  Good Diagnostic category:  < or = 5 kPa: high probability of being normal IMPRESSION: ULTRASOUND RUQ: 1. Mild nodular contour and heterogeneous echogenicity throughout the hepatic parenchyma, suggesting early changes of cirrhosis. 2. Two liver lesions, one of which in the left lobe of the liver appears to represent a complex cyst (as has been demonstrated on prior CT examinations), while the other lesion in the right lobe of the liver is suspicious for a solid lesion and has not been previously demonstrated. Further evaluation with nonemergent abdominal MRI with and without IV gadolinium is recommended in the near future to exclude the possibility of hepatocellular carcinoma. ULTRASOUND HEPATIC ELASTOGRAPHY: Median kPa:  1.78 Diagnostic category:  < or = 5 kPa: high probability of being normal The use of hepatic elastography is applicable to patients with viral hepatitis and non-alcoholic fatty liver disease. At this time, there is insufficient data for the referenced cut-off values and use in other causes of liver disease, including alcoholic liver disease. Patients, however, may be assessed by elastography and serve  as their own reference standard/baseline. In patients with non-alcoholic liver disease, the values suggesting compensated advanced chronic liver disease (cACLD) may be lower, and patients may need additional testing with elasticity results of 7-9 kPa. Please note that abnormal hepatic elasticity and shear wave velocities may also be identified in clinical settings other than with hepatic fibrosis, such as: acute hepatitis, elevated right heart and central venous pressures including use of beta blockers, veno-occlusive disease (Budd-Chiari), infiltrative processes such as mastocytosis/amyloidosis/infiltrative tumor/lymphoma, extrahepatic cholestasis, with hyperemia in the post-prandial state, and with liver transplantation. Correlation with patient history, laboratory data, and clinical condition recommended. Diagnostic Categories: < or =5 kPa: high probability of being normal < or =9 kPa: in the absence of other known clinical signs, rules out cACLD >9 kPa and ?13 kPa: suggestive of cACLD, but needs further testing >13 kPa: highly suggestive of cACLD > or =17 kPa: highly suggestive of cACLD with an increased probability of clinically significant portal hypertension Electronically Signed   By: Vinnie Langton M.D.   On: 05/21/2019 11:18     Scheduled Meds: . enoxaparin (LOVENOX) injection  40 mg Subcutaneous Q24H  . feeding supplement (ENSURE ENLIVE)  237 mL Oral TID BM  . furosemide  40 mg Intravenous Q12H  . multivitamin with minerals  1 tablet Oral Daily  . sodium chloride flush  3 mL Intravenous Q12H  . spironolactone  25 mg Oral BID   Continuous Infusions: . sodium chloride Stopped (05/20/19 0542)    Principal Problem:  Anasarca Active Problems:   CAD Status post coronary artery bypass grafting: 1995   Permanent atrial fibrillation (HCC)   Chronic combined systolic and diastolic heart failure (HCC)   Benign essential HTN   CAD S/P multiple PCIs   Elevated troponin   Acute on chronic  systolic CHF (congestive heart failure) (HCC)   CRI (chronic renal insufficiency), stage 3 (moderate)     Atina Feeley Derek Jack, MD, FACP, [facility]. [facility]  If 7PM-7AM, please contact night-coverage [email or web address] Password [email or web address] 05/21/2019, 5:06 PM    LOS: 2 days

## 2019-05-21 NOTE — Progress Notes (Signed)
Pt had medium black BM, communicated that this isn't uncommon for him.

## 2019-05-22 ENCOUNTER — Encounter: Payer: PPO | Admitting: Cardiovascular Disease

## 2019-05-22 ENCOUNTER — Telehealth: Payer: Self-pay | Admitting: Cardiovascular Disease

## 2019-05-22 DIAGNOSIS — K7290 Hepatic failure, unspecified without coma: Secondary | ICD-10-CM

## 2019-05-22 DIAGNOSIS — K7460 Unspecified cirrhosis of liver: Secondary | ICD-10-CM

## 2019-05-22 LAB — BASIC METABOLIC PANEL
Anion gap: 5 (ref 5–15)
BUN: 59 mg/dL — ABNORMAL HIGH (ref 8–23)
CO2: 28 mmol/L (ref 22–32)
Calcium: 7.2 mg/dL — ABNORMAL LOW (ref 8.9–10.3)
Chloride: 105 mmol/L (ref 98–111)
Creatinine, Ser: 1.77 mg/dL — ABNORMAL HIGH (ref 0.61–1.24)
GFR calc Af Amer: 41 mL/min — ABNORMAL LOW (ref >60)
GFR calc non Af Amer: 35 mL/min — ABNORMAL LOW (ref >60)
Glucose, Bld: 126 mg/dL — ABNORMAL HIGH (ref 70–99)
Potassium: 3.7 mmol/L (ref 3.5–5.1)
Sodium: 138 mmol/L (ref 135–145)

## 2019-05-22 LAB — CBC
HCT: 25.5 % — ABNORMAL LOW (ref 39.0–52.0)
Hemoglobin: 7.5 g/dL — ABNORMAL LOW (ref 13.0–17.0)
MCH: 23 pg — ABNORMAL LOW (ref 26.0–34.0)
MCHC: 29.4 g/dL — ABNORMAL LOW (ref 30.0–36.0)
MCV: 78.2 fL — ABNORMAL LOW (ref 80.0–100.0)
Platelets: 249 10*3/uL (ref 150–400)
RBC: 3.26 MIL/uL — ABNORMAL LOW (ref 4.22–5.81)
RDW: 20.7 % — ABNORMAL HIGH (ref 11.5–15.5)
WBC: 8 10*3/uL (ref 4.0–10.5)
nRBC: 0.6 % — ABNORMAL HIGH (ref 0.0–0.2)

## 2019-05-22 MED ORDER — FUROSEMIDE 10 MG/ML IJ SOLN
80.0000 mg | Freq: Once | INTRAMUSCULAR | Status: AC
  Administered 2019-05-22: 80 mg via INTRAVENOUS
  Filled 2019-05-22: qty 8

## 2019-05-22 MED ORDER — FUROSEMIDE 10 MG/ML IJ SOLN: 40.0000 mg | Freq: Two times a day (BID) | INTRAMUSCULAR | Status: DC

## 2019-05-22 NOTE — Telephone Encounter (Signed)
Wife calling to let Dr. Sallyanne Kuster and his nurse know that the patient was taken to the hospital on Friday 05/19/19 after he fell out of his chair and couldn't control himself. They noticed he was more swollen than normal so they took him to [facility] where he is currently admitted. She state that he has heart, kidney and liver failure and they will be doing a biopsy on him. If you need to get in touch with her, you can call her at [phone], if she doesn't answer you can leave a VM.

## 2019-05-22 NOTE — Telephone Encounter (Signed)
Noted thank you

## 2019-05-22 NOTE — Progress Notes (Signed)
Progress Note  Patient Name: Thomas [name] Date of Encounter: 05/22/2019  Primary Cardiologist: Croitoru Primary Electrophysiologist: [name]  Subjective   Volume status improving. Documented UOP of 1 L for the past 24 hours with a net - 3.9 L for the admission. Renal function is improving. Uncertain weight accuracy.   Inpatient Medications    Scheduled Meds: . enoxaparin (LOVENOX) injection  40 mg Subcutaneous Q24H  . feeding supplement (ENSURE ENLIVE)  237 mL Oral TID BM  . furosemide  40 mg Intravenous Q12H  . multivitamin with minerals  1 tablet Oral Daily  . sodium chloride flush  3 mL Intravenous Q12H  . spironolactone  25 mg Oral BID   Continuous Infusions: . sodium chloride Stopped (05/20/19 0542)   PRN Meds: sodium chloride, acetaminophen **OR** acetaminophen, bisacodyl, oxyCODONE, polyethylene glycol, sodium chloride flush   Vital Signs    Vitals:   05/21/19 1938 05/22/19 0400 05/22/19 0551 05/22/19 0728  BP: 107/62  113/63 108/69  Pulse: 61  (!) 59 66  Resp: 20  20 18   Temp: 98.2 F (36.8 C)  98 F (36.7 C) 97.9 F (36.6 C)  TempSrc: Oral  Oral Oral  SpO2: 95%  93% 92%  Weight:  102.4 kg    Height:        Intake/Output Summary (Last 24 hours) at 05/22/2019 1027 Last data filed at 05/22/2019 0945 Gross per 24 hour  Intake 1111.35 ml  Output 2175 ml  Net -1063.65 ml   Filed Weights   05/21/19 0359 05/21/19 1728 05/22/19 0400  Weight: 103.1 kg 101.6 kg 102.4 kg    Telemetry    paced - Personally Reviewed  ECG    No new tracings - Personally Reviewed  Physical Exam   GEN: No acute distress. Deaf.  Neck: JVD elevated ~ 10 cm. Cardiac: RRR, II/VI blowing systolic murmur, no rubs, or gallops.  Respiratory: Diminished breath sounds bilaterally with bilateral crackles.  GI: Soft, nontender, anasarca.   MS: 3+ bilateral lower extremity edema to the sacrum; No deformity. Neuro:  Alert and oriented x 3; Nonfocal.  Psych: Normal affect.  Labs      Chemistry Recent Labs  Lab 05/19/19 1404 05/19/19 2047 05/20/19 0637 05/21/19 0426 05/22/19 0410  NA   < >  --  142 141 138  K   < >  --  4.2 3.9 3.7  CL   < >  --  106 103 105  CO2   < >  --  24 26 28   GLUCOSE   < >  --  89 135* 126*  BUN   < >  --  49* 61* 59*  CREATININE   < > 1.98* 1.99* 1.96* 1.77*  CALCIUM   < >  --  7.8* 7.8* 7.2*  PROT  --  4.4* 4.4* 4.5*  --   ALBUMIN  --  1.7* 1.8* 1.7*  --   AST  --  48* 65* 65*  --   ALT  --  17 21 23   --   ALKPHOS  --  95 97 82  --   BILITOT  --  0.9 0.9 0.6  --   GFRNONAA   < > 31* 30* 31* 35*  GFRAA   < > 35* 35* 36* 41*  ANIONGAP   < >  --  12 12 5    < > = values in this interval not displayed.     Hematology Recent Labs  Lab 05/20/19 [phone]  05/21/19 0426 05/22/19 0418  WBC 14.4* 9.0 8.0  RBC 3.48* 3.35* 3.26*  HGB 8.0* 7.6* 7.5*  HCT 27.0* 25.6* 25.5*  MCV 77.6* 76.4* 78.2*  MCH 23.0* 22.7* 23.0*  MCHC 29.6* 29.7* 29.4*  RDW 20.8* 21.0* 20.7*  PLT 250 227 249    Cardiac EnzymesNo results for input(s): TROPONINI in the last 168 hours. No results for input(s): TROPIPOC in the last 168 hours.   BNP Recent Labs  Lab 05/19/19 1404  BNP 633.0*     DDimer No results for input(s): DDIMER in the last 168 hours.   Radiology    [place] ELASTOGRAPHY LIVER  Result Date: 05/21/2019 IMPRESSION: ULTRASOUND RUQ: 1. Mild nodular contour and heterogeneous echogenicity throughout the hepatic parenchyma, suggesting early changes of cirrhosis. 2. Two liver lesions, one of which in the left lobe of the liver appears to represent a complex cyst (as has been demonstrated on prior CT examinations), while the other lesion in the right lobe of the liver is suspicious for a solid lesion and has not been previously demonstrated. Further evaluation with nonemergent abdominal MRI with and without IV gadolinium is recommended in the near future to exclude the possibility of hepatocellular carcinoma. ULTRASOUND HEPATIC ELASTOGRAPHY: Median  kPa:  1.78 Diagnostic category:  < or = 5 kPa: high probability of being normal The use of hepatic elastography is applicable to patients with viral hepatitis and non-alcoholic fatty liver disease. At this time, there is insufficient data for the referenced cut-off values and use in other causes of liver disease, including alcoholic liver disease. Patients, however, may be assessed by elastography and serve as their own reference standard/baseline. In patients with non-alcoholic liver disease, the values suggesting compensated advanced chronic liver disease (cACLD) may be lower, and patients may need additional testing with elasticity results of 7-9 kPa. Please note that abnormal hepatic elasticity and shear wave velocities may also be identified in clinical settings other than with hepatic fibrosis, such as: acute hepatitis, elevated right heart and central venous pressures including use of beta blockers, veno-occlusive disease (Budd-Chiari), infiltrative processes such as mastocytosis/amyloidosis/infiltrative tumor/lymphoma, extrahepatic cholestasis, with hyperemia in the post-prandial state, and with liver transplantation. Correlation with patient history, laboratory data, and clinical condition recommended. Diagnostic Categories: < or =5 kPa: high probability of being normal < or =9 kPa: in the absence of other known clinical signs, rules out cACLD >9 kPa and ?13 kPa: suggestive of cACLD, but needs further testing >13 kPa: highly suggestive of cACLD > or =17 kPa: highly suggestive of cACLD with an increased probability of clinically significant portal hypertension Electronically Signed   By: Vinnie Langton M.D.   On: 05/21/2019 11:18   US Abdomen Limited RUQ  Result Date: 05/21/2019 IMPRESSION: ULTRASOUND RUQ: 1. Mild nodular contour and heterogeneous echogenicity throughout the hepatic parenchyma, suggesting early changes of cirrhosis. 2. Two liver lesions, one of which in the left lobe of the liver  appears to represent a complex cyst (as has been demonstrated on prior CT examinations), while the other lesion in the right lobe of the liver is suspicious for a solid lesion and has not been previously demonstrated. Further evaluation with nonemergent abdominal MRI with and without IV gadolinium is recommended in the near future to exclude the possibility of hepatocellular carcinoma. ULTRASOUND HEPATIC ELASTOGRAPHY: Median kPa:  1.78 Diagnostic category:  < or = 5 kPa: high probability of being normal The use of hepatic elastography is applicable to patients with viral hepatitis and non-alcoholic fatty liver disease. At  this time, there is insufficient data for the referenced cut-off values and use in other causes of liver disease, including alcoholic liver disease. Patients, however, may be assessed by elastography and serve as their own reference standard/baseline. In patients with non-alcoholic liver disease, the values suggesting compensated advanced chronic liver disease (cACLD) may be lower, and patients may need additional testing with elasticity results of 7-9 kPa. Please note that abnormal hepatic elasticity and shear wave velocities may also be identified in clinical settings other than with hepatic fibrosis, such as: acute hepatitis, elevated right heart and central venous pressures including use of beta blockers, veno-occlusive disease (Budd-Chiari), infiltrative processes such as mastocytosis/amyloidosis/infiltrative tumor/lymphoma, extrahepatic cholestasis, with hyperemia in the post-prandial state, and with liver transplantation. Correlation with patient history, laboratory data, and clinical condition recommended. Diagnostic Categories: < or =5 kPa: high probability of being normal < or =9 kPa: in the absence of other known clinical signs, rules out cACLD >9 kPa and ?13 kPa: suggestive of cACLD, but needs further testing >13 kPa: highly suggestive of cACLD > or =17 kPa: highly suggestive of cACLD  with an increased probability of clinically significant portal hypertension Electronically Signed   By: Vinnie Langton M.D.   On: 05/21/2019 11:18    Cardiac Studies   2D echo 05/21/2019: 1. Left ventricular ejection fraction, by estimation, is 35 to 40%. The  left ventricle has moderately decreased function. The left ventricle  demonstrates global hypokinesis. The left ventricular internal cavity size  was moderately to severely dilated.  There is mild left ventricular hypertrophy. Left ventricular diastolic  parameters are indeterminate. There is akinesis of the left ventricular,  basal inferior segment.  2. Right ventricular systolic function is mildly reduced. The right  ventricular size is mildly enlarged. There is moderately elevated  pulmonary artery systolic pressure.  3. Left atrial size was severely dilated.  4. Right atrial size was moderately dilated.  5. Moderate pleural effusion in the left lateral region.  6. The mitral valve is degenerative. Mild to moderate mitral valve  regurgitation.  7. Tricuspid valve regurgitation is severe.  8. The aortic valve has an indeterminant number of cusps. Aortic valve  regurgitation is mild. Mild aortic valve sclerosis is present, with no  evidence of aortic valve stenosis.  9. Aortic dilatation noted. There is mild dilatation of the aortic root  and of the ascending aorta measuring 43 mm.  10. The inferior vena cava is dilated in size with <50% respiratory  variability, suggesting right atrial pressure of 15 mmHg.  __________  2D Echo 04/2018: 1. The left ventricle has moderately reduced systolic function, with an  ejection fraction of 35-40%. The cavity size was moderately dilated. Left  ventricular diastology could not be evaluated.  2. The right ventricle has mildly reduced systolic function. The cavity  was moderately enlarged. There is no increase in right ventricular wall  thickness.  3. Left atrial size was  severely dilated.  4. Right atrial size was severely dilated.  5. The mitral valve is normal in structure. Mitral valve regurgitation is  mild to moderate by color flow Doppler. The MR jet is eccentric  posteriorly directed.  6. The tricuspid valve is normal in structure.  7. The aortic valve is tricuspid Aortic valve regurgitation is mild by  color flow Doppler.  8. The pulmonic valve was normal in structure.  9. There is moderate dilatation of the aortic root and of the ascending  aorta.  10. Moderately dilated pulmonary artery.  11.  Pulmonary hypertension is moderate.  12. The inferior vena cava was dilated in size with <50% respiratory  variability.  13. Right atrial pressure is estimated at 15 mmHg.   Patient Profile     85 y.o. male with history of CAD status post remote CABG with subsequent PCI/BMS to the SVG to OM in 2015 with known CTO of the SVG to RCA and patent LIMA to LAD, chronic combined systolic and diastolic CHF, ICM, [employer] CRT-D implanted in 2014 permanent A. fib not on [place] secondary to underlying microcytic anemia with known AVMs as well as falls, prior GI bleed requiring packed red blood cell transfusion with subsequent discovery of aforementioned AVMs, CKD stage III, prostate cancer, HTN, HLD, and deafness who presented with dyspnea and orthopnea.   Assessment & Plan    1. Acute on chronic combined systolic and diastolic CHF/ICM s/p ICD: -He remains volume up, net - 3.9 L for the admission, uncertain weights -GDMT has been held since admission secondary to relative hypotension and AKI, resume as able -Device appears to be functioning normally, follow up with EP as directed  -Tolerating addition of spironolactone as below -Will give a one-time dose of IV Lasix 80 mg this afternoon after discussion with IM with further recommendations pending his response with this -Goal 1 kg weight reduction per 24 hours -CHF education -Strict I/O with daily standing  weights  2. CAD s/p CABG with subsequent PCI with demand ischemia: -Never with chest pain -Minimal troponin bump in the setting of known multivessel CAD with volume overload and AKI -Echo with stable cardiomyopathy as above -No plans for inpatient ischemic evaluation at this time -Resume PTA Coreg, Imdur, and statin when able  3. Permanent Afib: -Rate well controlled without rate limiting medications at this time -Not on [place] secondary to prior GI with known AVMs -CHADS2VASc at least 5 (CHF, HTN, age x 2, vascular disease)  4. Acute on CKD stage III: -Improving  -Close monitoring with diuresis   5. Anasarca/cirrhosis/liver nodule: -GI has been consulted -Spironolactone -Lasix as above -Will need liver biopsy   6. HTN: -Blood pressure remains on the soft side -Holding PTA medications   7. Language barrier: -ASL interpreter used   8. Anemia with known AVM: -HGB 7.5 -Transfuse per IM   For questions or updates, please contact [place] Please consult [email or web address] for contact info under Cardiology/STEMI.    Signed, Christell Faith, PA-C [place] Pager: [phone] 05/22/2019, 10:27 AM

## 2019-05-22 NOTE — Progress Notes (Addendum)
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 ASL interpretation with history of combined systolic and diastolic heart failure, atrial flutter status post biventricular pacemaker, CAD, HTN was admitted 05/19/2019 with anasarca, cough and shortness of breath.  Covid is negative.  BNP is elevated 638.  Work-up has revealed a cirrhotic liver.  He is getting diuresed with daily weights and I's and O's.  GI and cardiology consulting.  Subjective:  Patient seen with ASL translator.  Patient's wife was present throughout the visit and participated actively.  She was the primary person to ask questions about his care.  She was mostly concerned about his diagnoses both of his heart and his liver.  She is also wondering why he developed so much edema so quickly over the past couple months.  Patient himself indicated that he was feeling better and better.  Notes that his scrotum is shrinking and his lower extremities are also decreasing.  His breathing continues to improve.  He is happy with the progress.  Nursing report of "black tarry stools"  yesterday  Objective: Vitals:   05/22/19 0551 05/22/19 0728 05/22/19 1146 05/22/19 1547  BP: 113/63 108/69 (!) 108/57 (!) 108/59  Pulse: (!) 59 66 60 66  Resp: 20 18 18 16   Temp: 98 F (36.7 C) 97.9 F (36.6 C) 97.9 F (36.6 C) 97.7 F (36.5 C)  TempSrc: Oral Oral Oral Oral  SpO2: 93% 92% 95% 92%  Weight:      Height:        Intake/Output Summary (Last 24 hours) at 05/22/2019 1600 Last data filed at 05/22/2019 1454 Gross per 24 hour  Intake 1326 ml  Output 2525 ml  Net -1199 ml   Filed Weights   05/21/19 0359 05/21/19 1728 05/22/19 0400  Weight: 103.1 kg 101.6 kg 102.4 kg     Assessment & Plan:   85 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  Patient also has CKD 3.  Anasarca Patient continues to diurese reasonably on 40 mg IV twice daily, -1 L yesterday. Weight measurement is clearly unreliable Will change diuresis to Lasix 80 mg x 1 now and follow urine output If he will tolerate it, can consider increasing to 80 twice daily However would want to be very careful and not diurese too quickly given possibility of hepatorenal syndrome.  Acute decompensated combined systolic and diastolic heart failure  [place] forces apparently improved with diuresis, creatinine now better. We will try increasing Lasix to 80 mg x 1 and consider increasing it to 80 twice daily if toleratedEchocardiogram done yesterday is without change from previous He does have moderate sized pleural effusions bilaterally, no need for thoracentesis at present we will try to diurese.  If unable to adequately diurese, can consider therapeutic pleurocentesis. [place] cardiology consultation, they will continue to follow  Cirrhosis Patient with early cirrhosis on ultrasound. Tolerating spironolactone 25 twice daily without hyperkalemia, will increase to 3 times daily if potassium continues normal.  Goal would be to have patient on spironolactone 100 mg a day.  However it is possible that his potassium is remaining low due to his diuresis.  Would need to follow that very  closely after diuresis has been discontinued.   No ascites seen on liver ultrasound so no diagnostic tap is possible.  "Black tarry stools" per nursing report Patient remains hemodynamically stable H&H is stable Send stool for guaiac x3  Acute on chronic renal failure, CKD 3 Creatinine improved with diuresis given likely improvement in Star[place] forces.   Will need to avoid hepatorenal syndrome so  diuresis must be judicious.  Liver nodule Discussed liver nodule with interventional radiology, they do not think this is a particularly concerning nodule.  Recommendation is for noncontrasted MRI to be done after patient is adequately diuresed (so he can lie still and flat during MRI) to see if this nodule is in fact a nodule or just part of the heterogenous architecture of his liver.  Cough with recent Covid exposure Cough is improved with diuresis. Covid PCR is negative Chest CT does not show any infiltration Although procalcitonin is 2, patient has remained afebrile and clinically well status post discontinuation of ceftriaxone azithromycin which was started empirically. Follow clinically.  Elevated troponin Thought to be secondary to demand ischemia per cardiology Troponin peaked at 411 and was trending down.  Atrial fibrillation Biventricular paced rhythm No anticoagulation presumably secondary to history of multiple GI bleeds.  HTN Hold antihypertensives at present given soft blood pressure  History of GI bleed No evidence of bleeding at present, follow H&H as warranted Avoid antiplatelets and anticoagulants   DVT prophylaxis: Lovenox Code Status: Full Family Communication: Will attempt to communicate with wife who is also deaf at home Disposition Plan:   Patient is from: Home  Anticipated Discharge Location: Home  Barriers to Discharge: Ongoing decompensated heart failure and decompensated cirrhosis undergoing diuresis Is patient medically stable for Discharge: No   Consultants:  Cardiology  GI  Procedures:  None  Antimicrobials:  Ceftriaxone and azithromycin were started empirically yesterday and discontinued today   Exam:  General: Patient in good spirits sitting up recliner with his attentive wife at bedside. Eyes: sclera anicteric, conjuctiva mild injection bilaterally CVS: S1-S2, regular 2/6 systolic murmur Respiratory: Small lung volumes  with improved air entry, does have decreased breath sounds at bases. GI: NABS, soft, NT, obese. LE: Massive but improving edema in entire leg up to buttocks.  Including proving scrotal edema. Neuro: A/O x 3, Moving all extremities equally with normal strength, CN 3-12 intact, grossly nonfocal.  Psych: patient is logical and coherent, judgment and insight appear poor, mood and affect continue to be appreciative and friendly.  Data Reviewed: Basic Metabolic Panel: Recent Labs  Lab 05/19/19 1404 05/19/19 2047 05/20/19 0637 05/21/19 0426 05/22/19 0410  NA 139  --  142 141 138  K 4.1  --  4.2 3.9 3.7  CL 105  --  106 103 105  CO2 26  --  24 26 28   GLUCOSE 108*  --  89 135* 126*  BUN 37*  --  49* 61* 59*  CREATININE 1.73* 1.98* 1.99* 1.96* 1.77*  CALCIUM 8.0*  --  7.8* 7.8* 7.2*   Liver Function Tests: Recent Labs  Lab 05/19/19 2047 05/20/19 0637 05/21/19 0426  AST 48* 65* 65*  ALT 17 21 23   ALKPHOS 95 97 82  BILITOT 0.9 0.9 0.6  PROT 4.4* 4.4* 4.5*  ALBUMIN 1.7* 1.8* 1.7*   No results for input(s): LIPASE, AMYLASE in the last 168 hours. No results for input(s): AMMONIA in the last 168 hours. CBC: Recent Labs  Lab 05/19/19 1404 05/20/19 0637 05/21/19 0426 05/22/19 0418  WBC  14.2* 14.4* 9.0 8.0  NEUTROABS  --   --  6.9  --   HGB 8.2* 8.0* 7.6* 7.5*  HCT 27.5* 27.0* 25.6* 25.5*  MCV 77.5* 77.6* 76.4* 78.2*  PLT 283 250 227 249   Cardiac Enzymes: No results for input(s): CKTOTAL, CKMB, CKMBINDEX, TROPONINI in the last 168 hours. BNP (last 3 results) No results for input(s): PROBNP in the last 8760 hours. CBG: No results for input(s): GLUCAP in the last 168 hours.  Recent Results (from the past 240 hour(s))  SARS CORONAVIRUS 2 (TAT 6-24 HRS) Nasopharyngeal Nasopharyngeal Swab     Status: None   Collection Time: 05/19/19  3:53 PM   Specimen: Nasopharyngeal Swab  Result Value Ref Range Status   SARS Coronavirus 2 NEGATIVE NEGATIVE Final    Comment:  (NOTE) SARS-CoV-2 target nucleic acids are NOT DETECTED. The SARS-CoV-2 RNA is generally detectable in upper and lower respiratory specimens during the acute phase of infection. Negative results do not preclude SARS-CoV-2 infection, do not rule out co-infections with other pathogens, and should not be used as the sole basis for treatment or other patient management decisions. Negative results must be combined with clinical observations, patient history, and epidemiological information. The expected result is Negative. Fact Sheet for Patients: SugarRoll.be Fact Sheet for Healthcare Providers: https://www.woods-mathews.com/ This test is not yet approved or cleared by the [place] FDA and  has been authorized for detection and/or diagnosis of SARS-CoV-2 by FDA under an Emergency Use Authorization (EUA). This EUA will remain  in effect (meaning this test can be used) for the duration of the COVID-19 declaration under Section 56 4(b)(1) of the Act, 21 U.S.C. section 360bbb-3(b)(1), unless the authorization is terminated or revoked sooner. Performed at [facility], [place] [address] [place], [place] [address]       Studies: ECHOCARDIOGRAM COMPLETE  Result Date: 05/21/2019    ECHOCARDIOGRAM REPORT   Patient Name:   FRANKIE GOUDY Date of Exam: 05/21/2019 Medical Rec #:  [id number]      Height:       74.0 in Accession #:    [id number]     Weight:       227.2 lb Date of Birth:  [birth year]     BSA:          2.295 m Patient Age:    34 years       BP:           107/45 mmHg Patient Gender: M              HR:           62 bpm. Exam Location:  [facility] Procedure: 2D Echo and Intracardiac Opacification Agent Indications:     Dyspnea 786.09/ R06.00  History:         Patient has prior history of Echocardiogram examinations, most                  recent 05/02/2018.  Sonographer:     Arville Go RDCS Referring Phys:  [id number] [place] Diagnosing  Phys: Nelva Bush MD  Sonographer Comments: Technically difficult study due to poor echo windows. Image acquisition challenging due to patient body habitus. IMPRESSIONS  1. Left ventricular ejection fraction, by estimation, is 35 to 40%. The left ventricle has moderately decreased function. The left ventricle demonstrates global hypokinesis. The left ventricular internal cavity size was moderately to severely dilated. There is mild left ventricular hypertrophy. Left ventricular diastolic parameters are indeterminate. There  is akinesis of the left ventricular, basal inferior segment.  2. Right ventricular systolic function is mildly reduced. The right ventricular size is mildly enlarged. There is moderately elevated pulmonary artery systolic pressure.  3. Left atrial size was severely dilated.  4. Right atrial size was moderately dilated.  5. Moderate pleural effusion in the left lateral region.  6. The mitral valve is degenerative. Mild to moderate mitral valve regurgitation.  7. Tricuspid valve regurgitation is severe.  8. The aortic valve has an indeterminant number of cusps. Aortic valve regurgitation is mild. Mild aortic valve sclerosis is present, with no evidence of aortic valve stenosis.  9. Aortic dilatation noted. There is mild dilatation of the aortic root and of the ascending aorta measuring 43 mm. 10. The inferior vena cava is dilated in size with <50% respiratory variability, suggesting right atrial pressure of 15 mmHg. FINDINGS  Left Ventricle: Left ventricular ejection fraction, by estimation, is 35 to 40%. The left ventricle has moderately decreased function. The left ventricle demonstrates global hypokinesis. Definity contrast agent was given IV to delineate the left ventricular endocardial borders. The left ventricular internal cavity size was moderately to severely dilated. There is mild left ventricular hypertrophy. Left ventricular diastolic parameters are indeterminate. Right Ventricle: The  right ventricular size is mildly enlarged. No increase in right ventricular wall thickness. Right ventricular systolic function is mildly reduced. There is moderately elevated pulmonary artery systolic pressure. The tricuspid regurgitant velocity is 3.11 m/s, and with an assumed right atrial pressure of 15 mmHg, the estimated right ventricular systolic pressure is [id number] mmHg. Left Atrium: Left atrial size was severely dilated. Right Atrium: Right atrial size was moderately dilated. Pericardium: There is no evidence of pericardial effusion. Mitral Valve: The mitral valve is degenerative in appearance. There is mild thickening of the mitral valve leaflet(s). Mild to moderate mitral valve regurgitation, with posteriorly-directed jet. Tricuspid Valve: The tricuspid valve is grossly normal. Tricuspid valve regurgitation is severe. The flow in the hepatic veins is reversed during ventricular systole. Aortic Valve: The aortic valve has an indeterminant number of cusps. . There is mild thickening of the aortic valve. Aortic valve regurgitation is mild. Mild aortic valve sclerosis is present, with no evidence of aortic valve stenosis. There is mild thickening of the aortic valve. Aortic valve peak gradient measures 8.9 mmHg. Pulmonic Valve: The pulmonic valve was normal in structure. Pulmonic valve regurgitation is not visualized. No evidence of pulmonic stenosis. Aorta: Aortic dilatation noted. There is mild dilatation of the aortic root and of the ascending aorta measuring 43 mm. Pulmonary Artery: The pulmonary artery is not well seen. Venous: The inferior vena cava is dilated in size with less than 50% respiratory variability, suggesting right atrial pressure of 15 mmHg. IAS/Shunts: The interatrial septum was not well visualized. Additional Comments: A pacer wire is visualized in the right atrium and right ventricle. There is a moderate pleural effusion in the left lateral region.  LEFT VENTRICLE PLAX 2D LVIDd:          6.38 cm      Diastology LVIDs:         4.90 cm      LV e' lateral:   10.60 cm/s LV PW:         1.30 cm      LV E/e' lateral: 10.8 LV IVS:        1.15 cm      LV e' medial:    8.92 cm/s LVOT diam:     2.10  cm      LV E/e' medial:  12.8 LV SV:         52 LV SV Index:   23 LVOT Area:     3.46 cm  LV Volumes (MOD) LV vol d, MOD A2C: 192.0 ml LV vol d, MOD A4C: 246.0 ml LV vol s, MOD A2C: 125.0 ml LV vol s, MOD A4C: 153.0 ml LV SV MOD A2C:     67.0 ml LV SV MOD A4C:     246.0 ml LV SV MOD BP:      79.4 ml RIGHT VENTRICLE RV Basal diam:  5.20 cm RV S prime:     10.20 cm/s TAPSE (M-mode): 1.6 cm LEFT ATRIUM            Index       RIGHT ATRIUM           Index LA diam:      6.40 cm  2.79 cm/m  RA Area:     33.00 cm LA Vol (A2C): 122.0 ml 53.17 ml/m RA Volume:   123.00 ml 53.61 ml/m LA Vol (A4C): 164.0 ml 71.48 ml/m  AORTIC VALVE                PULMONIC VALVE AV Area (Vmax): 2.08 cm    PV Vmax:        1.21 m/s AV Vmax:        149.00 cm/s PV Peak grad:   5.9 mmHg AV Peak Grad:   8.9 mmHg    RVOT Peak grad: 4 mmHg LVOT Vmax:      89.40 cm/s LVOT Vmean:     53.200 cm/s LVOT VTI:       0.151 m  AORTA Ao Root diam: 4.30 cm Ao Asc diam:  4.10 cm MITRAL VALVE                TRICUSPID VALVE MV Area (PHT): 2.56 cm     TV Peak grad:   29.4 mmHg MV Decel Time: 296 msec     TV Vmax:        2.71 m/s MV E velocity: 114.00 cm/s  TR Peak grad:   38.7 mmHg                             TR Vmax:        311.00 cm/s                              SHUNTS                             Systemic VTI:  0.15 m                             Systemic Diam: 2.10 cm Nelva Bush MD Electronically signed by Nelva Bush MD Signature Date/Time: 05/21/2019/6:51:19 PM    Final    [place] ELASTOGRAPHY LIVER  Result Date: 05/21/2019 CLINICAL DATA:  85 year old male with history of cirrhosis presenting with anasarca. EXAM: US ABDOMEN LIMITED - RIGHT UPPER QUADRANT ULTRASOUND HEPATIC ELASTOGRAPHY TECHNIQUE: Sonography of the right upper quadrant was performed.  In addition, ultrasound elastography evaluation of the liver was performed. A region of interest was placed within the right lobe of the liver. Following application of a compressive sonographic pulse,  tissue compressibility was assessed. Multiple assessments were performed at the selected site. Median tissue compressibility was determined. Previously, hepatic stiffness was assessed by shear wave velocity. Based on recently published Society of Radiologists in Ultrasound consensus article, reporting is now recommended to be performed in the SI units of pressure (kiloPascals) representing hepatic stiffness/elasticity. The obtained result is compared to the published reference standards. (cACLD = compensated Advanced Chronic Liver Disease) COMPARISON:  None. FINDINGS: ULTRASOUND ABDOMEN LIMITED RIGHT UPPER QUADRANT Gallbladder: No gallstones or wall thickening visualized. No sonographic Murphy sign noted. Common bile duct: Diameter: 4.8 mm Liver: No focal lesion identified, however, the echogenicity of the liver is diffusely heterogeneous and slightly nodular. In the left lobe of the liver there is a 3.1 x 2.4 x 2.5 cm lesion which is generally anechoic with increased through transmission, although there more are multiple internal septations, compatible with a complex cyst. In the right lobe of the liver there is an additional area of very subtle heterogeneous echogenicity measuring 2.2 x 2.0 cm, suspicious for a solid lesion. Portal vein is patent on color Doppler imaging with normal direction of blood flow towards the liver. ULTRASOUND HEPATIC ELASTOGRAPHY Device: Siemens Helix VTQ Patient position: Supine Transducer 5C1 Number of measurements: 10 Hepatic segment:  8 Median kPa: 1.78 IQR: 0.13 IQR/Median kPa ratio: 0.06 Data quality:  Good Diagnostic category:  < or = 5 kPa: high probability of being normal IMPRESSION: ULTRASOUND RUQ: 1. Mild nodular contour and heterogeneous echogenicity throughout the hepatic  parenchyma, suggesting early changes of cirrhosis. 2. Two liver lesions, one of which in the left lobe of the liver appears to represent a complex cyst (as has been demonstrated on prior CT examinations), while the other lesion in the right lobe of the liver is suspicious for a solid lesion and has not been previously demonstrated. Further evaluation with nonemergent abdominal MRI with and without IV gadolinium is recommended in the near future to exclude the possibility of hepatocellular carcinoma. ULTRASOUND HEPATIC ELASTOGRAPHY: Median kPa:  1.78 Diagnostic category:  < or = 5 kPa: high probability of being normal The use of hepatic elastography is applicable to patients with viral hepatitis and non-alcoholic fatty liver disease. At this time, there is insufficient data for the referenced cut-off values and use in other causes of liver disease, including alcoholic liver disease. Patients, however, may be assessed by elastography and serve as their own reference standard/baseline. In patients with non-alcoholic liver disease, the values suggesting compensated advanced chronic liver disease (cACLD) may be lower, and patients may need additional testing with elasticity results of 7-9 kPa. Please note that abnormal hepatic elasticity and shear wave velocities may also be identified in clinical settings other than with hepatic fibrosis, such as: acute hepatitis, elevated right heart and central venous pressures including use of beta blockers, veno-occlusive disease (Budd-Chiari), infiltrative processes such as mastocytosis/amyloidosis/infiltrative tumor/lymphoma, extrahepatic cholestasis, with hyperemia in the post-prandial state, and with liver transplantation. Correlation with patient history, laboratory data, and clinical condition recommended. Diagnostic Categories: < or =5 kPa: high probability of being normal < or =9 kPa: in the absence of other known clinical signs, rules out cACLD >9 kPa and ?13 kPa:  suggestive of cACLD, but needs further testing >13 kPa: highly suggestive of cACLD > or =17 kPa: highly suggestive of cACLD with an increased probability of clinically significant portal hypertension Electronically Signed   By: Vinnie Langton M.D.   On: 05/21/2019 11:18   US Abdomen Limited RUQ  Result Date: 05/21/2019 CLINICAL DATA:  85 year old male with history of cirrhosis presenting with anasarca. EXAM: US ABDOMEN LIMITED - RIGHT UPPER QUADRANT ULTRASOUND HEPATIC ELASTOGRAPHY TECHNIQUE: Sonography of the right upper quadrant was performed. In addition, ultrasound elastography evaluation of the liver was performed. A region of interest was placed within the right lobe of the liver. Following application of a compressive sonographic pulse, tissue compressibility was assessed. Multiple assessments were performed at the selected site. Median tissue compressibility was determined. Previously, hepatic stiffness was assessed by shear wave velocity. Based on recently published Society of Radiologists in Ultrasound consensus article, reporting is now recommended to be performed in the SI units of pressure (kiloPascals) representing hepatic stiffness/elasticity. The obtained result is compared to the published reference standards. (cACLD = compensated Advanced Chronic Liver Disease) COMPARISON:  None. FINDINGS: ULTRASOUND ABDOMEN LIMITED RIGHT UPPER QUADRANT Gallbladder: No gallstones or wall thickening visualized. No sonographic Murphy sign noted. Common bile duct: Diameter: 4.8 mm Liver: No focal lesion identified, however, the echogenicity of the liver is diffusely heterogeneous and slightly nodular. In the left lobe of the liver there is a 3.1 x 2.4 x 2.5 cm lesion which is generally anechoic with increased through transmission, although there more are multiple internal septations, compatible with a complex cyst. In the right lobe of the liver there is an additional area of very subtle heterogeneous echogenicity  measuring 2.2 x 2.0 cm, suspicious for a solid lesion. Portal vein is patent on color Doppler imaging with normal direction of blood flow towards the liver. ULTRASOUND HEPATIC ELASTOGRAPHY Device: Siemens Helix VTQ Patient position: Supine Transducer 5C1 Number of measurements: 10 Hepatic segment:  8 Median kPa: 1.78 IQR: 0.13 IQR/Median kPa ratio: 0.06 Data quality:  Good Diagnostic category:  < or = 5 kPa: high probability of being normal IMPRESSION: ULTRASOUND RUQ: 1. Mild nodular contour and heterogeneous echogenicity throughout the hepatic parenchyma, suggesting early changes of cirrhosis. 2. Two liver lesions, one of which in the left lobe of the liver appears to represent a complex cyst (as has been demonstrated on prior CT examinations), while the other lesion in the right lobe of the liver is suspicious for a solid lesion and has not been previously demonstrated. Further evaluation with nonemergent abdominal MRI with and without IV gadolinium is recommended in the near future to exclude the possibility of hepatocellular carcinoma. ULTRASOUND HEPATIC ELASTOGRAPHY: Median kPa:  1.78 Diagnostic category:  < or = 5 kPa: high probability of being normal The use of hepatic elastography is applicable to patients with viral hepatitis and non-alcoholic fatty liver disease. At this time, there is insufficient data for the referenced cut-off values and use in other causes of liver disease, including alcoholic liver disease. Patients, however, may be assessed by elastography and serve as their own reference standard/baseline. In patients with non-alcoholic liver disease, the values suggesting compensated advanced chronic liver disease (cACLD) may be lower, and patients may need additional testing with elasticity results of 7-9 kPa. Please note that abnormal hepatic elasticity and shear wave velocities may also be identified in clinical settings other than with hepatic fibrosis, such as: acute hepatitis, elevated right  heart and central venous pressures including use of beta blockers, veno-occlusive disease (Budd-Chiari), infiltrative processes such as mastocytosis/amyloidosis/infiltrative tumor/lymphoma, extrahepatic cholestasis, with hyperemia in the post-prandial state, and with liver transplantation. Correlation with patient history, laboratory data, and clinical condition recommended. Diagnostic Categories: < or =5 kPa: high probability of being normal < or =9 kPa: in the absence of other known clinical signs, rules out cACLD >9 kPa  and ?13 kPa: suggestive of cACLD, but needs further testing >13 kPa: highly suggestive of cACLD > or =17 kPa: highly suggestive of cACLD with an increased probability of clinically significant portal hypertension Electronically Signed   By: Vinnie Langton M.D.   On: 05/21/2019 11:18     Scheduled Meds: . enoxaparin (LOVENOX) injection  40 mg Subcutaneous Q24H  . feeding supplement (ENSURE ENLIVE)  237 mL Oral TID BM  . [START ON 05/23/2019] furosemide  40 mg Intravenous BID  . furosemide  80 mg Intravenous Once  . multivitamin with minerals  1 tablet Oral Daily  . sodium chloride flush  3 mL Intravenous Q12H  . spironolactone  25 mg Oral BID   Continuous Infusions: . sodium chloride Stopped (05/20/19 0542)    Principal Problem:   Anasarca Active Problems:   CAD Status post coronary artery bypass grafting: 1995   Permanent atrial fibrillation (HCC)   Chronic combined systolic and diastolic heart failure (HCC)   Benign essential HTN   CAD S/P multiple PCIs   Elevated troponin   Acute on chronic systolic CHF (congestive heart failure) (HCC)   CRI (chronic renal insufficiency), stage 3 (moderate)     Dewaine Oats Derek Jack, MD, FACP, [facility]. [facility]  If 7PM-7AM, please contact night-coverage [email or web address] Password [email or web address] 05/22/2019, 4:00 PM    LOS: 3 days

## 2019-05-22 NOTE — Progress Notes (Signed)
Paged Jamse Arn, MD concerning pt having tarry black stools x2 days. Last Hgb 7.6 yesterday and no CBC ordered today.

## 2019-05-23 DIAGNOSIS — I5023 Acute on chronic systolic (congestive) heart failure: Secondary | ICD-10-CM

## 2019-05-23 LAB — CBC
HCT: 24.5 % — ABNORMAL LOW (ref 39.0–52.0)
Hemoglobin: 7.3 g/dL — ABNORMAL LOW (ref 13.0–17.0)
MCH: 23 pg — ABNORMAL LOW (ref 26.0–34.0)
MCHC: 29.8 g/dL — ABNORMAL LOW (ref 30.0–36.0)
MCV: 77 fL — ABNORMAL LOW (ref 80.0–100.0)
Platelets: 246 10*3/uL (ref 150–400)
RBC: 3.18 MIL/uL — ABNORMAL LOW (ref 4.22–5.81)
RDW: 20.7 % — ABNORMAL HIGH (ref 11.5–15.5)
WBC: 6.7 10*3/uL (ref 4.0–10.5)
nRBC: 0.4 % — ABNORMAL HIGH (ref 0.0–0.2)

## 2019-05-23 LAB — BASIC METABOLIC PANEL
Anion gap: 7 (ref 5–15)
BUN: 44 mg/dL — ABNORMAL HIGH (ref 8–23)
CO2: 28 mmol/L (ref 22–32)
Calcium: 7.6 mg/dL — ABNORMAL LOW (ref 8.9–10.3)
Chloride: 105 mmol/L (ref 98–111)
Creatinine, Ser: 1.37 mg/dL — ABNORMAL HIGH (ref 0.61–1.24)
GFR calc Af Amer: 55 mL/min — ABNORMAL LOW (ref >60)
GFR calc non Af Amer: 48 mL/min — ABNORMAL LOW (ref >60)
Glucose, Bld: 107 mg/dL — ABNORMAL HIGH (ref 70–99)
Potassium: 3.5 mmol/L (ref 3.5–5.1)
Sodium: 140 mmol/L (ref 135–145)

## 2019-05-23 LAB — HEPATITIS B SURFACE ANTIBODY, QUANTITATIVE: Hep B S AB Quant (Post): <3.1 m[IU]/mL — ABNORMAL LOW (ref >9.9)

## 2019-05-23 MED ORDER — SPIRONOLACTONE 25 MG PO TABS
100.0000 mg | ORAL_TABLET | Freq: Every day | ORAL | Status: DC
  Administered 2019-05-23 – 2019-05-25 (×3): 100 mg via ORAL
  Filled 2019-05-23 (×3): qty 4

## 2019-05-23 MED ORDER — FUROSEMIDE 10 MG/ML IJ SOLN
80.0000 mg | Freq: Two times a day (BID) | INTRAMUSCULAR | Status: DC
  Administered 2019-05-23 – 2019-05-28 (×10): 80 mg via INTRAVENOUS
  Filled 2019-05-23 (×11): qty 8

## 2019-05-23 NOTE — Telephone Encounter (Signed)
Looked over his notes. Seems to be improving from a heart point of view. Being followed by our colleagues at [facility].

## 2019-05-23 NOTE — Progress Notes (Addendum)
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  Covid is negative.  BNP is elevated 638.  Work-up has revealed a cirrhotic liver.  He is getting diuresed with daily weights and I's and O's.  GI and cardiology consulting.  Subjective:  Patient continues to be pleased with his diuresis.  Indicates that his legs are even thinner and his scrotum is thinner and his breathing is better.  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  Patient also has CKD 3.  Anasarca Patient increased to Lasix 80 twice daily today. Will need to follow I's and O's very very closely. Unclear if weight measurement is reliable or not given variation in weights. Will need to follow creatinine closely, patient might be at risk for hepatorenal syndrome if  diuresis is too aggressive. Patient's creatinine is actually improved today as we continue to optimize Starling forces in the heart.  Acute decompensated combined systolic and diastolic heart failure  Starling forces apparently improved with diuresis, creatinine continues to improve. As noted above, patient increased to Lasix 80 twice daily today.   He does have moderate sized pleural effusions bilaterally, no need for thoracentesis at present we will try to diurese.  If unable to adequately diurese, can consider therapeutic pleurocentesis. [place] cardiology consultation, they will continue to follow  Cirrhosis Patient with early cirrhosis on ultrasound. Parental lactone increased to 100 daily yesterday, potassium is still within normal limits. Of course patient is getting diuresed significantly which may account for still normal potassium. Would need to follow potassium on spironolactone 100 after diuresis is completed. No ascites seen on liver ultrasound so no diagnostic tap is possible.  "Black tarry stools" per nursing report/history of GI bleed Patient remains hemodynamically stable, H&H are stable however patient does have a history of GI bleed. Continue to follow H&H and consider GI consult if H&H continue to fall. This can also be worked up as an outpatient if patient remains clinically stable. Continue to avoid NSAIDs and antiplatelets.  Acute on chronic renal failure, CKD 3 Creatinine improved with diuresis given likely improvement in Starling forces.   Will need to avoid hepatorenal syndrome so diuresis must be judicious.  Liver nodule Discussed liver nodule with  interventional radiology, they do not think this is a particularly concerning finding, possibly over read.  Recommendation is for noncontrasted MRI to be done after patient is adequately diuresed (so he can lie still and flat during MRI) to see if this nodule is in fact a nodule or just part of the heterogenous  architecture of his liver.  Cough with recent Covid exposure Cough is improved with diuresis. Covid PCR is negative Chest CT does not show any infiltration Although procalcitonin is 2, patient has remained afebrile and clinically well status post discontinuation of ceftriaxone azithromycin which was started empirically. Follow clinically.  Elevated troponin Thought to be secondary to demand ischemia per cardiology Troponin peaked at 411 and was trending down.  Atrial fibrillation Biventricular paced rhythm No anticoagulation presumably secondary to history of multiple GI bleeds.  HTN Hold antihypertensives at present given soft blood pressure   DVT prophylaxis: Lovenox Code Status: Full Family Communication: Long conversation with his wife yesterday Disposition Plan:   Patient is from: Home  Anticipated Discharge Location: Home  Barriers to Discharge: Ongoing decompensated heart failure and decompensated cirrhosis undergoing diuresis Is patient medically stable for   Discharge: No   Consultants:  Cardiology  GI  Procedures:  None  Antimicrobials:  Ceftriaxone and azithromycin were started empirically and discontinued after 1 day   Exam:  General: Patient lying flat in bed in no acute distress, breathing comfortably  eyes: sclera anicteric, conjuctiva mild injection bilaterally CVS: S1-S2, regular 2/6 systolic murmur Respiratory: Small lung volumes with improved air entry, does have decreased breath sounds at bases. GI: NABS, soft, NT, obese. LE: Extremity edema continues to improve, scrotal edema continues to improve  neuro: A/O x 3, Moving all extremities equally with normal strength, CN 3-12 intact, grossly nonfocal.  Psych: patient is logical and coherent, judgment and insight appear poor, mood and affect continue to be appreciative and friendly.  Data Reviewed: Basic Metabolic Panel: Recent Labs  Lab 05/19/19 1404 05/19/19 1404 05/19/19 2047  05/20/19 [id number] 05/21/19 0426 05/22/19 0410 05/23/19 0325  NA 139  --   --  142 141 138 140  K 4.1  --   --  4.2 3.9 3.7 3.5  CL 105  --   --  106 103 105 105  CO2 26  --   --  24 26 28 28   GLUCOSE 108*  --   --  89 135* 126* 107*  BUN 37*  --   --  49* 61* 59* 44*  CREATININE 1.73*   < > 1.98* 1.99* 1.96* 1.77* 1.37*  CALCIUM 8.0*  --   --  7.8* 7.8* 7.2* 7.6*   < > = values in this interval not displayed.   Liver Function Tests: Recent Labs  Lab 05/19/19 2047 05/20/19 0637 05/21/19 0426  AST 48* 65* 65*  ALT 17 21 23   ALKPHOS 95 97 82  BILITOT 0.9 0.9 0.6  PROT 4.4* 4.4* 4.5*  ALBUMIN 1.7* 1.8* 1.7*   No results for input(s): LIPASE, AMYLASE in the last 168 hours. No results for input(s): AMMONIA in the last 168 hours. CBC: Recent Labs  Lab 05/19/19 1404 05/20/19 0637 05/21/19 0426 05/22/19 0418 05/23/19 0325  WBC 14.2* 14.4* 9.0 8.0 6.7  NEUTROABS  --   --  6.9  --   --   HGB 8.2* 8.0* 7.6* 7.5* 7.3*  HCT 27.5* 27.0* 25.6* 25.5* 24.5*  MCV 77.5* 77.6* 76.4* 78.2* 77.0*  PLT 283 250 227 249 246   Cardiac  Enzymes: No results for input(s): CKTOTAL, CKMB, CKMBINDEX, TROPONINI in the last 168 hours. BNP (last 3 results) No results for input(s): PROBNP in the last 8760 hours. CBG: No results for input(s): GLUCAP in the last 168 hours.  Recent Results (from the past 240 hour(s))  SARS CORONAVIRUS 2 (TAT 6-24 HRS) Nasopharyngeal Nasopharyngeal Swab     Status: None   Collection Time: 05/19/19  3:53 PM   Specimen: Nasopharyngeal Swab  Result Value Ref Range Status   SARS Coronavirus 2 NEGATIVE NEGATIVE Final    Comment: (NOTE) SARS-CoV-2 target nucleic acids are NOT DETECTED. The SARS-CoV-2 RNA is generally detectable in upper and lower respiratory specimens during the acute phase of infection. Negative results do not preclude SARS-CoV-2 infection, do not rule out co-infections with other pathogens, and should not be used as the sole basis for treatment or  other patient management decisions. Negative results must be combined with clinical observations, patient history, and epidemiological information. The expected result is Negative. Fact Sheet for Patients: SugarRoll.be Fact Sheet for Healthcare Providers: https://www.woods-mathews.com/ This test is not yet approved or cleared by the [place] FDA and  has been authorized for detection and/or diagnosis of SARS-CoV-2 by FDA under an Emergency Use Authorization (EUA). This EUA will remain  in effect (meaning this test can be used) for the duration of the COVID-19 declaration under Section 56 4(b)(1) of the Act, 21 U.S.C. section 360bbb-3(b)(1), unless the authorization is terminated or revoked sooner. Performed at [facility], [place] [address] [place], [place] [address]       Studies: No results found.   Scheduled Meds: . enoxaparin (LOVENOX) injection  40 mg Subcutaneous Q24H  . feeding supplement (ENSURE ENLIVE)  237 mL Oral TID BM  . furosemide  80 mg Intravenous BID  . multivitamin with minerals  1 tablet Oral Daily  . sodium chloride flush  3 mL Intravenous Q12H  . spironolactone  100 mg Oral Daily   Continuous Infusions: . sodium chloride Stopped (05/20/19 0542)    Principal Problem:   Anasarca Active Problems:   CAD Status post coronary artery bypass grafting: 1995   Permanent atrial fibrillation (HCC)   Chronic combined systolic and diastolic heart failure (HCC)   Benign essential HTN   CAD S/P multiple PCIs   Elevated troponin   Acute on chronic systolic CHF (congestive heart failure) (HCC)   CRI (chronic renal insufficiency), stage 3 (moderate)   Decompensated hepatic cirrhosis (HCC)     Dewaine Oats Derek Jack, MD, FACP, [facility]. [facility]  If 7PM-7AM, please contact night-coverage [email or web address] Password [email or web address] 05/23/2019, 3:55 PM    LOS: 4 days

## 2019-05-23 NOTE — Progress Notes (Signed)
Progress Note  Patient Name: Thomas [name] Date of Encounter: 05/23/2019  Primary Cardiologist: Croitoru Primary Electrophysiologist: [name]  Subjective   Dyspnea much improved. Swelling improving. No chest pain. Documented UOP of 3 L for the past 24 hours with a net - 6.3 L for the admission. Weight trend 102.4-->99.8 kg. Renal function is improving. HGB low, though stable.   Inpatient Medications    Scheduled Meds:  enoxaparin (LOVENOX) injection  40 mg Subcutaneous Q24H   feeding supplement (ENSURE ENLIVE)  237 mL Oral TID BM   furosemide  80 mg Intravenous BID   multivitamin with minerals  1 tablet Oral Daily   sodium chloride flush  3 mL Intravenous Q12H   spironolactone  100 mg Oral Daily   Continuous Infusions:  sodium chloride Stopped (05/20/19 0542)   PRN Meds: sodium chloride, acetaminophen **OR** acetaminophen, bisacodyl, oxyCODONE, polyethylene glycol, sodium chloride flush   Vital Signs    Vitals:   05/22/19 1936 05/23/19 0514 05/23/19 0521 05/23/19 0805  BP: 123/60 108/66  114/63  Pulse: 69 (!) 58  64  Resp: 20 20  17   Temp: 97.6 F (36.4 C) 98.2 F (36.8 C)  97.9 F (36.6 C)  TempSrc: Oral Oral  Oral  SpO2: 94% 96%  100%  Weight:   99.8 kg   Height:        Intake/Output Summary (Last 24 hours) at 05/23/2019 0857 Last data filed at 05/23/2019 0805 Gross per 24 hour  Intake 1200 ml  Output 3325 ml  Net -2125 ml   Filed Weights   05/21/19 1728 05/22/19 0400 05/23/19 0521  Weight: 101.6 kg 102.4 kg 99.8 kg    Telemetry    paced with PVCs - Personally Reviewed  ECG    No new tracings - Personally Reviewed  Physical Exam   GEN: No acute distress. Deaf.  Neck: JVD elevated ~ 10 cm. Cardiac: RRR, II/VI blowing systolic murmur, no rubs, or gallops.  Respiratory: Diminished breath sounds bilaterally with bilateral crackles.  GI: Soft, nontender, anasarca.   MS: Improving 2+ bilateral lower extremity edema to the sacrum; No  deformity. Neuro:  Alert and oriented x 3; Nonfocal.  Psych: Normal affect.  Labs    Chemistry Recent Labs  Lab 05/19/19 1404 05/19/19 2047 05/20/19 [id number] 05/20/19 [id number] 05/21/19 0426 05/22/19 0410 05/23/19 0325  NA   < >  --  142   < > 141 138 140  K   < >  --  4.2   < > 3.9 3.7 3.5  CL   < >  --  106   < > 103 105 105  CO2   < >  --  24   < > 26 28 28   GLUCOSE   < >  --  89   < > 135* 126* 107*  BUN   < >  --  49*   < > 61* 59* 44*  CREATININE   < > 1.98* 1.99*   < > 1.96* 1.77* 1.37*  CALCIUM   < >  --  7.8*   < > 7.8* 7.2* 7.6*  PROT  --  4.4* 4.4*  --  4.5*  --   --   ALBUMIN  --  1.7* 1.8*  --  1.7*  --   --   AST  --  48* 65*  --  65*  --   --   ALT  --  17 21  --  23  --   --  ALKPHOS  --  95 97  --  82  --   --   BILITOT  --  0.9 0.9  --  0.6  --   --   GFRNONAA   < > 31* 30*   < > 31* 35* 48*  GFRAA   < > 35* 35*   < > 36* 41* 55*  ANIONGAP   < >  --  12   < > 12 5 7    < > = values in this interval not displayed.     Hematology Recent Labs  Lab 05/21/19 0426 05/22/19 0418 05/23/19 0325  WBC 9.0 8.0 6.7  RBC 3.35* 3.26* 3.18*  HGB 7.6* 7.5* 7.3*  HCT 25.6* 25.5* 24.5*  MCV 76.4* 78.2* 77.0*  MCH 22.7* 23.0* 23.0*  MCHC 29.7* 29.4* 29.8*  RDW 21.0* 20.7* 20.7*  PLT 227 249 246    Cardiac EnzymesNo results for input(s): TROPONINI in the last 168 hours. No results for input(s): TROPIPOC in the last 168 hours.   BNP Recent Labs  Lab 05/19/19 1404  BNP 633.0*     DDimer No results for input(s): DDIMER in the last 168 hours.   Radiology    [place] ELASTOGRAPHY LIVER  Result Date: 05/21/2019 IMPRESSION: ULTRASOUND RUQ: 1. Mild nodular contour and heterogeneous echogenicity throughout the hepatic parenchyma, suggesting early changes of cirrhosis. 2. Two liver lesions, one of which in the left lobe of the liver appears to represent a complex cyst (as has been demonstrated on prior CT examinations), while the other lesion in the right lobe of the liver is  suspicious for a solid lesion and has not been previously demonstrated. Further evaluation with nonemergent abdominal MRI with and without IV gadolinium is recommended in the near future to exclude the possibility of hepatocellular carcinoma. ULTRASOUND HEPATIC ELASTOGRAPHY: Median kPa:  1.78 Diagnostic category:  < or = 5 kPa: high probability of being normal The use of hepatic elastography is applicable to patients with viral hepatitis and non-alcoholic fatty liver disease. At this time, there is insufficient data for the referenced cut-off values and use in other causes of liver disease, including alcoholic liver disease. Patients, however, may be assessed by elastography and serve as their own reference standard/baseline. In patients with non-alcoholic liver disease, the values suggesting compensated advanced chronic liver disease (cACLD) may be lower, and patients may need additional testing with elasticity results of 7-9 kPa. Please note that abnormal hepatic elasticity and shear wave velocities may also be identified in clinical settings other than with hepatic fibrosis, such as: acute hepatitis, elevated right heart and central venous pressures including use of beta blockers, veno-occlusive disease (Budd-Chiari), infiltrative processes such as mastocytosis/amyloidosis/infiltrative tumor/lymphoma, extrahepatic cholestasis, with hyperemia in the post-prandial state, and with liver transplantation. Correlation with patient history, laboratory data, and clinical condition recommended. Diagnostic Categories: < or =5 kPa: high probability of being normal < or =9 kPa: in the absence of other known clinical signs, rules out cACLD >9 kPa and ?13 kPa: suggestive of cACLD, but needs further testing >13 kPa: highly suggestive of cACLD > or =17 kPa: highly suggestive of cACLD with an increased probability of clinically significant portal hypertension Electronically Signed   By: Vinnie Langton M.D.   On: 05/21/2019  11:18   US Abdomen Limited RUQ  Result Date: 05/21/2019 IMPRESSION: ULTRASOUND RUQ: 1. Mild nodular contour and heterogeneous echogenicity throughout the hepatic parenchyma, suggesting early changes of cirrhosis. 2. Two liver lesions, one of which in the left lobe  of the liver appears to represent a complex cyst (as has been demonstrated on prior CT examinations), while the other lesion in the right lobe of the liver is suspicious for a solid lesion and has not been previously demonstrated. Further evaluation with nonemergent abdominal MRI with and without IV gadolinium is recommended in the near future to exclude the possibility of hepatocellular carcinoma. ULTRASOUND HEPATIC ELASTOGRAPHY: Median kPa:  1.78 Diagnostic category:  < or = 5 kPa: high probability of being normal The use of hepatic elastography is applicable to patients with viral hepatitis and non-alcoholic fatty liver disease. At this time, there is insufficient data for the referenced cut-off values and use in other causes of liver disease, including alcoholic liver disease. Patients, however, may be assessed by elastography and serve as their own reference standard/baseline. In patients with non-alcoholic liver disease, the values suggesting compensated advanced chronic liver disease (cACLD) may be lower, and patients may need additional testing with elasticity results of 7-9 kPa. Please note that abnormal hepatic elasticity and shear wave velocities may also be identified in clinical settings other than with hepatic fibrosis, such as: acute hepatitis, elevated right heart and central venous pressures including use of beta blockers, veno-occlusive disease (Budd-Chiari), infiltrative processes such as mastocytosis/amyloidosis/infiltrative tumor/lymphoma, extrahepatic cholestasis, with hyperemia in the post-prandial state, and with liver transplantation. Correlation with patient history, laboratory data, and clinical condition recommended.  Diagnostic Categories: < or =5 kPa: high probability of being normal < or =9 kPa: in the absence of other known clinical signs, rules out cACLD >9 kPa and ?13 kPa: suggestive of cACLD, but needs further testing >13 kPa: highly suggestive of cACLD > or =17 kPa: highly suggestive of cACLD with an increased probability of clinically significant portal hypertension Electronically Signed   By: Vinnie Langton M.D.   On: 05/21/2019 11:18    Cardiac Studies   2D echo 05/21/2019: 1. Left ventricular ejection fraction, by estimation, is 35 to 40%. The  left ventricle has moderately decreased function. The left ventricle  demonstrates global hypokinesis. The left ventricular internal cavity size  was moderately to severely dilated.  There is mild left ventricular hypertrophy. Left ventricular diastolic  parameters are indeterminate. There is akinesis of the left ventricular,  basal inferior segment.  2. Right ventricular systolic function is mildly reduced. The right  ventricular size is mildly enlarged. There is moderately elevated  pulmonary artery systolic pressure.  3. Left atrial size was severely dilated.  4. Right atrial size was moderately dilated.  5. Moderate pleural effusion in the left lateral region.  6. The mitral valve is degenerative. Mild to moderate mitral valve  regurgitation.  7. Tricuspid valve regurgitation is severe.  8. The aortic valve has an indeterminant number of cusps. Aortic valve  regurgitation is mild. Mild aortic valve sclerosis is present, with no  evidence of aortic valve stenosis.  9. Aortic dilatation noted. There is mild dilatation of the aortic root  and of the ascending aorta measuring 43 mm.  10. The inferior vena cava is dilated in size with <50% respiratory  variability, suggesting right atrial pressure of 15 mmHg.  __________  2D Echo 04/2018: 1. The left ventricle has moderately reduced systolic function, with an  ejection fraction of 35-40%.  The cavity size was moderately dilated. Left  ventricular diastology could not be evaluated.  2. The right ventricle has mildly reduced systolic function. The cavity  was moderately enlarged. There is no increase in right ventricular wall  thickness.  3.  Left atrial size was severely dilated.  4. Right atrial size was severely dilated.  5. The mitral valve is normal in structure. Mitral valve regurgitation is  mild to moderate by color flow Doppler. The MR jet is eccentric  posteriorly directed.  6. The tricuspid valve is normal in structure.  7. The aortic valve is tricuspid Aortic valve regurgitation is mild by  color flow Doppler.  8. The pulmonic valve was normal in structure.  9. There is moderate dilatation of the aortic root and of the ascending  aorta.  10. Moderately dilated pulmonary artery.  11. Pulmonary hypertension is moderate.  12. The inferior vena cava was dilated in size with <50% respiratory  variability.  13. Right atrial pressure is estimated at 15 mmHg.   Patient Profile     85 y.o. male with history of CAD status post remote CABG with subsequent PCI/BMS to the SVG to OM in 2015 with known CTO of the SVG to RCA and patent LIMA to LAD, chronic combined systolic and diastolic CHF, ICM, Medtronic CRT-D implanted in 2014 permanent A. fib not on [place] secondary to underlying microcytic anemia with known AVMs as well as falls, prior GI bleed requiring packed red blood cell transfusion with subsequent discovery of aforementioned AVMs, CKD stage III, prostate cancer, HTN, HLD, and deafness who presented with dyspnea and orthopnea.   Assessment & Plan    1. Acute on chronic combined systolic and diastolic CHF/ICM s/p ICD: -He remains volume up, net - 6.3 L for the admission, with weight 102.4-->99.8 kg -Volume status remains elevated, though improving -Agree with escalation of IV Lasix to 80 mg bid  -GDMT has been held since admission secondary to relative  hypotension and AKI, resume as able -Device appears to be functioning normally, follow up with EP as directed  -Tolerating addition of spironolactone as below -CHF education -Strict I/O with daily standing weights  2. CAD s/p CABG with subsequent PCI with demand ischemia: -Never with chest pain -Minimal troponin bump in the setting of known multivessel CAD with volume overload and AKI -Echo with stable cardiomyopathy as above -No plans for inpatient ischemic evaluation at this time -Resume PTA Coreg, Imdur, and statin when able  3. Permanent Afib: -Rate well controlled without rate limiting medications at this time -Not on [place] secondary to prior GI with known AVMs -CHADS2VASc at least 5 (CHF, HTN, age x 2, vascular disease)  4. Acute on CKD stage III: -Improving  -Close monitoring with diuresis   5. Anasarca/cirrhosis/liver nodule: -GI has been consulted -Spironolactone titrated to 100 mg daily  -Lasix as above -Will need liver biopsy   6. HTN: -Blood pressure remains on the soft side -Holding PTA medications   7. Language barrier: -ASL interpreter used   8. Anemia with known AVM: -HGB low, though stable -Transfuse per IM  9. IV infiltrated: -Noted along the right upper extremity -RN notified    For questions or updates, please contact [place] Please consult [email or web address] for contact info under Cardiology/STEMI.    Signed, Christell Faith, PA-C [place] Pager: [phone] 05/23/2019, 8:57 AM

## 2019-05-24 DIAGNOSIS — I5042 Chronic combined systolic (congestive) and diastolic (congestive) heart failure: Secondary | ICD-10-CM

## 2019-05-24 LAB — COMPREHENSIVE METABOLIC PANEL
ALT: 27 U/L (ref 0–44)
AST: 43 U/L — ABNORMAL HIGH (ref 15–41)
Albumin: 1.7 g/dL — ABNORMAL LOW (ref 3.5–5.0)
Alkaline Phosphatase: 117 U/L (ref 38–126)
Anion gap: 6 (ref 5–15)
BUN: 35 mg/dL — ABNORMAL HIGH (ref 8–23)
CO2: 31 mmol/L (ref 22–32)
Calcium: 7.6 mg/dL — ABNORMAL LOW (ref 8.9–10.3)
Chloride: 105 mmol/L (ref 98–111)
Creatinine, Ser: 1.23 mg/dL (ref 0.61–1.24)
GFR calc Af Amer: >60 mL/min (ref >60)
GFR calc non Af Amer: 54 mL/min — ABNORMAL LOW (ref >60)
Glucose, Bld: 121 mg/dL — ABNORMAL HIGH (ref 70–99)
Potassium: 3.8 mmol/L (ref 3.5–5.1)
Sodium: 142 mmol/L (ref 135–145)
Total Bilirubin: 0.7 mg/dL (ref 0.3–1.2)
Total Protein: 4.8 g/dL — ABNORMAL LOW (ref 6.5–8.1)

## 2019-05-24 LAB — CBC
HCT: 25.5 % — ABNORMAL LOW (ref 39.0–52.0)
Hemoglobin: 7.4 g/dL — ABNORMAL LOW (ref 13.0–17.0)
MCH: 22.5 pg — ABNORMAL LOW (ref 26.0–34.0)
MCHC: 29 g/dL — ABNORMAL LOW (ref 30.0–36.0)
MCV: 77.5 fL — ABNORMAL LOW (ref 80.0–100.0)
Platelets: 273 10*3/uL (ref 150–400)
RBC: 3.29 MIL/uL — ABNORMAL LOW (ref 4.22–5.81)
RDW: 20.7 % — ABNORMAL HIGH (ref 11.5–15.5)
WBC: 6.5 10*3/uL (ref 4.0–10.5)
nRBC: 0.6 % — ABNORMAL HIGH (ref 0.0–0.2)

## 2019-05-24 MED ORDER — ACETAMINOPHEN 325 MG PO TABS
650.0000 mg | ORAL_TABLET | Freq: Four times a day (QID) | ORAL | Status: DC | PRN
  Administered 2019-05-25 (×2): 650 mg via ORAL
  Filled 2019-05-24 (×2): qty 2

## 2019-05-24 MED ORDER — ACETAMINOPHEN 650 MG RE SUPP: 650.0000 mg | Freq: Four times a day (QID) | RECTAL | Status: DC | PRN

## 2019-05-24 NOTE — Progress Notes (Signed)
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  Covid is negative.  BNP is elevated 638.  Work-up has revealed a cirrhotic liver.  He is getting diuresed with daily weights and I's and O's.  Cardiology consulting.    Subjective 3/10: Patient seen with assistance of ASL interpreter while sitting up edge of bed having lunch today.  He reports he feels good.  Denies fevers or chills, abdominal pain, nausea vomiting or diarrhea, chest pain or shortness of breath.  States his leg swelling is slowly improving.  States he had been having a cough which he thinks might have been due to the fluid, states this is improving.  Says he feels a little better each day.  No acute events reported.   Assessment & Plan:   Principal Problem:   Anasarca Active Problems:   CAD Status post coronary artery bypass grafting: 1995   Permanent atrial fibrillation (HCC)   Chronic combined systolic and diastolic heart failure (HCC)   Benign essential HTN   CAD S/P multiple PCIs   Elevated troponin   Acute on chronic systolic CHF (congestive heart failure) (HCC)   CRI (chronic renal insufficiency), stage 3 (moderate)   Decompensated hepatic cirrhosis (HCC)   Anasarca --continue increased dose Lasix 80 twice daily. --continue spironolactone --strict I/O's and daily weights --monitor renal function closely, risk for cardiorenal syndrome with aggressive diuresis   Acute decompensation of combined systolic/diastolic CHF Star[place] forces apparently improved with diuresis, creatinine continues to improve.  Has moderate pleural effusions bilaterally, will defer thoracentesis for now and  continue diuresis. --Cardiology following --diuresis as above  Cirrhosis Patient with early cirrhosis on ultrasound, without ascites seen. --continue diuresis with Lasix and Aldactone --monitor potassium, replace as needed for K>4.0  "Black tarry stools" per nursing report - resolved History of GI bleed Patient hemodynamically stable, H&H stable, but does have a prior history of GI bleed. --monitor H&H --consider GI consult if further drop in Hbg or ongoing bleeding --recommend outpatient GI follow up --avoid all NSAIDs and antiplatelets  Acute kidney injury superimposed on CKD stage IIIa Creatinine improved with diuresis given likely improvement in [place] forces.   Will need to avoid hepatorenal syndrome so diuresis must be judicious. --monitor renal function and electrolytes  Liver nodule Discussed liver nodule with interventional radiology, they do not think this is a particularly concerning finding, possibly over-read.  Recommendation is for noncontrasted MRI to be done after patient is adequately diuresed (so he can lie still and flat during MRI) to see if this nodule is in fact a nodule or just part of the heterogenous architecture of his liver.  Cough with recent Covid exposure -improving with diuresis.  COVID-19 PCR negative.  Chest CT without infiltrates.  Procalcitonin was mildly elevated but patient afebrile and without leukocytosis or other clinical signs of infection.  Empiric antibiotics started on admission discontinued. --Monitor for signs symptoms of infection --Follow CBC  Elevated troponin -secondary to demand ischemia per cardiology. Troponin peaked at 411 and trended down.  Patient without chest pain.  Atrial fibrillation -with biventricular pacemaker.  Not on anticoagulation presumably due to history of recurrent GI bleeding.  Rate controlled without medication. --Monitor  Essential hypertension -currently with borderline soft blood  pressures --Hold antihypertensives   DVT prophylaxis: Lovenox   Code Status: Full Code  Family Communication: none at bedside during encounter   Disposition Plan:  Home pending improvement and clearance by cardiology, PT evaluation Coming From Home Exp DC Date 3/11 Barriers ongoing diuresis Medically Stable for Discharge? no   Consultants:   Cardiology  Procedures:   None  Antimicrobials:   Rocephin & Azithromycin empirically on admission, discontinued after 1 day    Objective: Vitals:   05/23/19 1615 05/23/19 2001 05/24/19 0327 05/24/19 0729  BP: (!) 106/53 104/63 120/60 106/60  Pulse: 61 60 72 60  Resp: 17 20 20 17   Temp: 98.3 F (36.8 C) 98.6 F (37 C) 98.4 F (36.9 C) 97.8 F (36.6 C)  TempSrc: Oral Oral Oral Oral  SpO2: 93% 94% 95% 97%  Weight:   98.7 kg   Height:        Intake/Output Summary (Last 24 hours) at 05/24/2019 0752 Last data filed at 05/24/2019 0340 Gross per 24 hour  Intake 600 ml  Output 2475 ml  Net -1875 ml   Filed Weights   05/22/19 0400 05/23/19 0521 05/24/19 0327  Weight: 102.4 kg 99.8 kg 98.7 kg    Examination:  General exam: awake, alert, no acute distress HEENT: deaf, clear conjunctiva, anicteric sclera, moist mucus membranes Respiratory system: CTAB but diminished at bases bilaterally, no wheezes, rales or rhonchi, normal respiratory effort. Cardiovascular system: normal S1/S2, RRR, bilateral lower extremity edema L>R.   Gastrointestinal system: soft, NT, ND, no HSM felt, +bowel sounds. Central nervous system: alert, oriented x3, no gross focal neurologic deficits, normal speech Extremities: moves all, normal tone Skin: dry, intact, normal temperature, lower extremity venous stasis changes Psychiatry: normal mood, congruent affect   Data Reviewed: I have personally reviewed following labs and imaging studies  CBC: Recent Labs  Lab 05/20/19 0637 05/21/19 0426 05/22/19 0418 05/23/19 0325 05/24/19 0413  WBC 14.4*  9.0 8.0 6.7 6.5  NEUTROABS  --  6.9  --   --   --   HGB 8.0* 7.6* 7.5* 7.3* 7.4*  HCT 27.0* 25.6* 25.5* 24.5* 25.5*  MCV 77.6* 76.4* 78.2* 77.0* 77.5*  PLT 250 227 249 246 [id number]   Basic Metabolic Panel: Recent Labs  Lab 05/20/19 0637 05/21/19 0426 05/22/19 0410 05/23/19 0325 05/24/19 0413  NA 142 141 138 140 142  K 4.2 3.9 3.7 3.5 3.8  CL 106 103 105 105 105  CO2 24 26 28 28 31   GLUCOSE 89 135* 126* 107* 121*  BUN 49* 61* 59* 44* 35*  CREATININE 1.99* 1.96* 1.77* 1.37* 1.23  CALCIUM 7.8* 7.8* 7.2* 7.6* 7.6*   GFR: Estimated Creatinine Clearance: 58.2 mL/min (by C-G formula based on SCr of 1.23 mg/dL). Liver Function Tests: Recent Labs  Lab 05/19/19 2047 05/20/19 0637 05/21/19 0426 05/24/19 0413  AST 48* 65* 65* 43*  ALT 17 21 23 27   ALKPHOS 95 97 82 117  BILITOT 0.9 0.9 0.6 0.7  PROT 4.4* 4.4* 4.5* 4.8*  ALBUMIN 1.7* 1.8* 1.7* 1.7*   No results for input(s): LIPASE, AMYLASE in the last 168 hours. No results for input(s): AMMONIA in the last 168 hours. Coagulation Profile: Recent Labs  Lab 05/21/19 0426  INR 1.4*   Cardiac Enzymes: No results for input(s): CKTOTAL, CKMB, CKMBINDEX, TROPONINI in the last 168 hours. BNP (last 3 results) No results for input(s): PROBNP in the last 8760 hours. HbA1C: No results for input(s): HGBA1C  in the last 72 hours. CBG: No results for input(s): GLUCAP in the last 168 hours. Lipid Profile: No results for input(s): CHOL, HDL, LDLCALC, TRIG, CHOLHDL, LDLDIRECT in the last 72 hours. Thyroid Function Tests: No results for input(s): TSH, T4TOTAL, FREET4, T3FREE, THYROIDAB in the last 72 hours. Anemia Panel: No results for input(s): VITAMINB12, FOLATE, FERRITIN, TIBC, IRON, RETICCTPCT in the last 72 hours. Sepsis Labs: Recent Labs  Lab 05/19/19 2048  PROCALCITON 2.09    Recent Results (from the past 240 hour(s))  SARS CORONAVIRUS 2 (TAT 6-24 HRS) Nasopharyngeal Nasopharyngeal Swab     Status: None   Collection Time:  05/19/19  3:53 PM   Specimen: Nasopharyngeal Swab  Result Value Ref Range Status   SARS Coronavirus 2 NEGATIVE NEGATIVE Final    Comment: (NOTE) SARS-CoV-2 target nucleic acids are NOT DETECTED. The SARS-CoV-2 RNA is generally detectable in upper and lower respiratory specimens during the acute phase of infection. Negative results do not preclude SARS-CoV-2 infection, do not rule out co-infections with other pathogens, and should not be used as the sole basis for treatment or other patient management decisions. Negative results must be combined with clinical observations, patient history, and epidemiological information. The expected result is Negative. Fact Sheet for Patients: SugarRoll.be Fact Sheet for Healthcare Providers: https://www.woods-mathews.com/ This test is not yet approved or cleared by the [place] FDA and  has been authorized for detection and/or diagnosis of SARS-CoV-2 by FDA under an Emergency Use Authorization (EUA). This EUA will remain  in effect (meaning this test can be used) for the duration of the COVID-19 declaration under Section 56 4(b)(1) of the Act, 21 U.S.C. section 360bbb-3(b)(1), unless the authorization is terminated or revoked sooner. Performed at [facility], [place] [address], [place], [place] [address]          Radiology Studies: No results found.      Scheduled Meds: . enoxaparin (LOVENOX) injection  40 mg Subcutaneous Q24H  . feeding supplement (ENSURE ENLIVE)  237 mL Oral TID BM  . furosemide  80 mg Intravenous BID  . multivitamin with minerals  1 tablet Oral Daily  . sodium chloride flush  3 mL Intravenous Q12H  . spironolactone  100 mg Oral Daily   Continuous Infusions: . sodium chloride Stopped (05/20/19 0542)     LOS: 5 days    Time spent: 40 minutes    Ezekiel Slocumb, DO [facility]   If 7PM-7AM, please contact  night-coverage [email or web address] 05/24/2019, 7:52 AM

## 2019-05-24 NOTE — Evaluation (Signed)
Physical Therapy Evaluation Patient Details Name: GARETT PIPPINS MRN: [id number] DOB: [birth year] Today's Date: 05/24/2019   History of Present Illness  Pt admitted for anasarca. HIstory includes heart failure, Aflutter, CAD, and HTN.   Clinical Impression  Pt is a pleasant 85 year old male who was admitted for anasarca. Pt uses ASL, interpreter via video, Janett Billow.  Pt performs bed mobility with min assist and able to sit at EOB. Unable to perform functional reaching outside of BOS on R side and overall demonstrates poor functional strength/balance. Unable to attain standing position this date due to weakness and pain. Reports history of multiple falls recently from using [facility] and is currently not at baseline level. Pt is very motivated to participate in therapy and hopeful for improvement. At baseline, pt reports wife assists with bathing/dressing however he is typically ambulatory. Would benefit from skilled PT to address above deficits and promote optimal return to PLOF; recommend transition to STR upon discharge from acute hospitalization.     Follow Up Recommendations SNF    Equipment Recommendations  None recommended by PT    Recommendations for Other Services       Precautions / Restrictions Precautions Precautions: Fall Restrictions Weight Bearing Restrictions: No      Mobility  Bed Mobility Overal bed mobility: Needs Assistance Bed Mobility: Supine to Sit     Supine to sit: Min assist     General bed mobility comments: needs assist for sliding B LEs off bed and trunk assistance for seated at EOB. ONce seated, able to sit with upright posture. Then able to laterally scoot towards HOB using arms.   Transfers                 General transfer comment: unable to stand this date due to pain/weakness  Ambulation/Gait                Stairs            Wheelchair Mobility    Modified Rankin (Stroke Patients Only)       Balance Overall balance  assessment: History of Falls;Needs assistance Sitting-balance support: Feet supported Sitting balance-Leahy Scale: Good                                       Pertinent Vitals/Pain Pain Assessment: Faces Faces Pain Scale: Hurts little more Pain Location: B LEs Pain Descriptors / Indicators: Constant;Discomfort Pain Intervention(s): Limited activity within patient's tolerance    Home Living Family/patient expects to be discharged to:: Private residence Living Arrangements: Spouse/significant other;Children Available Help at Discharge: Family;Available 24 hours/day Type of Home: House Home Access: Level entry     Home Layout: One level Home Equipment: Walker - 2 wheels;Cane - single point Additional Comments: Lives with wife and adult son     Prior Function Level of Independence: Independent with assistive device(s)         Comments: Was ambulatory with [facility] household distances. Reports history of multiple falls recently due to weakness.      Hand Dominance        Extremity/Trunk Assessment   Upper Extremity Assessment Upper Extremity Assessment: Generalized weakness(B UE grossly 4/5)    Lower Extremity Assessment Lower Extremity Assessment: Generalized weakness(B LE Grossly 2/5)       Communication   Communication: Prefers language other than [place];Interpreter utilized(ASL)  Cognition Arousal/Alertness: Lethargic(sleepy, however wakes up easily) Behavior During Therapy: [facility]  for tasks assessed/performed Overall Cognitive Status: Impaired/Different from baseline                                 General Comments: confused to date      General Comments      Exercises Other Exercises Other Exercises: Attempted ther-ex including SLRs and LAQ. Pt has difficulty with completion and reports pain. Noted weeping from wounds on distal B LEs. 5 reps performed   Assessment/Plan    PT Assessment Patient needs continued PT services  PT  Problem List Decreased strength;Decreased activity tolerance;Decreased balance;Decreased mobility;Decreased cognition;Pain       PT Treatment Interventions Gait training;DME instruction;Therapeutic exercise;Balance training    PT Goals (Current goals can be found in the Care Plan section)  Acute Rehab PT Goals Patient Stated Goal: to be able to walk again PT Goal Formulation: With patient Time For Goal Achievement: 06/07/19 Potential to Achieve Goals: Good    Frequency Min 2X/week   Barriers to discharge        Co-evaluation               AM-PAC PT "6 Clicks" Mobility  Outcome Measure Help needed turning from your back to your side while in a flat bed without using bedrails?: A Little Help needed moving from lying on your back to sitting on the side of a flat bed without using bedrails?: A Little Help needed moving to and from a bed to a chair (including a wheelchair)?: Total Help needed standing up from a chair using your arms (e.g., wheelchair or bedside chair)?: Total Help needed to walk in hospital room?: Total Help needed climbing 3-5 steps with a railing? : Total 6 Click Score: 10    End of Session   Activity Tolerance: Patient limited by fatigue Patient left: in bed;with bed alarm set Nurse Communication: Mobility status PT Visit Diagnosis: Muscle weakness (generalized) (M62.81);Repeated falls (R29.6);Difficulty in walking, not elsewhere classified (R26.2);Pain Pain - Right/Left: (bilat) Pain - part of body: Leg    Time: [id number] PT Time Calculation (min) (ACUTE ONLY): 27 min   Charges:   PT Evaluation $PT Eval Moderate Complexity: 1 Mod PT Treatments $Therapeutic Activity: 8-22 mins        [name], PT, DPT [phone]   Royalty Domagala 05/24/2019, 3:52 PM

## 2019-05-24 NOTE — Progress Notes (Signed)
Nutrition Follow Up Note  DOCUMENTATION CODES:   Not applicable  INTERVENTION:   Ensure Enlive po TID, each supplement provides 350 kcal and 20 grams of protein  MVI daily  Recommend continue ferrous sulfate 325mg  po daily  NUTRITION DIAGNOSIS:   Inadequate oral intake related to lethargy/confusion as evidenced by other (comment)(per RN report).  GOAL:   Patient will meet greater than or equal to 90% of their needs -progressing   MONITOR:   PO intake, Supplement acceptance, Weight trends, I & O's, Labs  ASSESSMENT:   Pt is deaf with a PMH significant for HTN, CAD s/p CABG with multiple stens, CHF, permanent A. Fib, biventricular pacemaker, lymphedema presented with lower extremity swelling and orthopnea. Pt admitted with anasarca.   Pt with improved appetite and oral intake; pt eating anywhere from sips and bites of meals to 100%. Pt is drinking Ensure supplements. Anasarca improving. Per chart, pt is down ~10lbs since admit.   Of note, pt with microcytic anemia; pt with h/o iron deficiency. Pt takes ferrous sulfate 325mg  po daily and is followed by PCP for this.   Medications reviewed and include: lovenox, lasix, MVI, aldactone   Labs reviewed: BUN 35(H), alb 1.7(L) Hgb 7.4(L), Hct 25.5(L), MCV 77.5(L), MCH 22.5(L), MCHC 29.0(L) Iron 22(L), TIBC 356, ferritin 21(L)- 05/2018  Diet Order:   Diet Order            Diet 2 gram sodium Room service appropriate? Yes; Fluid consistency: Thin  Diet effective now             EDUCATION NEEDS:   Not appropriate for education at this time  Skin:  Skin Assessment: Skin Integrity Issues: Skin Integrity Issues:: Other (Comment) Other: closed blisters on bilateral legs  Last BM:  3/9- type 2  Height:   Ht Readings from Last 1 Encounters:  05/19/19 6\' 2"  (1.88 m)    Weight:   Wt Readings from Last 10 Encounters:  05/24/19 98.7 kg  01/30/19 103 kg  11/07/18 108.5 kg  10/27/18 109.1 kg  10/11/18 105.2 kg  09/16/18  102.7 kg  09/05/18 104.9 kg  08/29/18 105 kg  08/17/18 100.3 kg  07/20/18 107 kg    BMI:  Body mass index is 27.93 kg/m.  Estimated Nutritional Needs:   Kcal:  2200-2500kcal/day  Protein:  110-125 grams  Fluid:  >2.2L/day  Koleen Distance MS, RD, LDN Contact information available in Amion

## 2019-05-24 NOTE — Progress Notes (Signed)
Progress Note  Patient Name: Thomas [name] Date of Encounter: 05/24/2019  Primary Cardiologist: Croitoru Primary Electrophysiologist: [name]  Subjective   Dyspnea much improved. Swelling improving. No chest pain. Documented UOP of 1.8 L for the past 24 hours with a net - 8.3 L for the admission. Weight trend 102.4-->99.8-->98.7 kg. Renal function is improving. HGB low, though stable. Left arm feels much better following removal of infiltrated IV.   Inpatient Medications    Scheduled Meds: . enoxaparin (LOVENOX) injection  40 mg Subcutaneous Q24H  . feeding supplement (ENSURE ENLIVE)  237 mL Oral TID BM  . furosemide  80 mg Intravenous BID  . multivitamin with minerals  1 tablet Oral Daily  . sodium chloride flush  3 mL Intravenous Q12H  . spironolactone  100 mg Oral Daily   Continuous Infusions: . sodium chloride Stopped (05/20/19 0542)   PRN Meds: sodium chloride, acetaminophen **OR** acetaminophen, bisacodyl, oxyCODONE, polyethylene glycol, sodium chloride flush   Vital Signs    Vitals:   05/23/19 1615 05/23/19 2001 05/24/19 0327 05/24/19 0729  BP: (!) 106/53 104/63 120/60 106/60  Pulse: 61 60 72 60  Resp: 17 20 20 17   Temp: 98.3 F (36.8 C) 98.6 F (37 C) 98.4 F (36.9 C) 97.8 F (36.6 C)  TempSrc: Oral Oral Oral Oral  SpO2: 93% 94% 95% 97%  Weight:   98.7 kg   Height:        Intake/Output Summary (Last 24 hours) at 05/24/2019 0810 Last data filed at 05/24/2019 0340 Gross per 24 hour  Intake 360 ml  Output 2375 ml  Net -2015 ml   Filed Weights   05/22/19 0400 05/23/19 0521 05/24/19 0327  Weight: 102.4 kg 99.8 kg 98.7 kg    Telemetry    paced with PVCs - Personally Reviewed  ECG    No new tracings - Personally Reviewed  Physical Exam   GEN: No acute distress. Deaf.  Neck: JVD elevated ~ 10 cm. Cardiac: RRR, II/VI blowing systolic murmur, no rubs, or gallops.  Respiratory: Diminished breath sounds bilaterally with bilateral crackles.  GI:  Soft, nontender, anasarca.   MS: Improving 2+ bilateral lower extremity edema to the sacrum; No deformity. Neuro:  Alert and oriented x 3; Nonfocal.  Psych: Normal affect.  Labs    Chemistry Recent Labs  Lab 05/20/19 [phone] 05/20/19 [id number] 05/21/19 0426 05/21/19 0426 05/22/19 0410 05/23/19 0325 05/24/19 0413  NA 142   < > 141   < > 138 140 142  K 4.2   < > 3.9   < > 3.7 3.5 3.8  CL 106   < > 103   < > 105 105 105  CO2 24   < > 26   < > 28 28 31   GLUCOSE 89   < > 135*   < > 126* 107* 121*  BUN 49*   < > 61*   < > 59* 44* 35*  CREATININE 1.99*   < > 1.96*   < > 1.77* 1.37* 1.23  CALCIUM 7.8*   < > 7.8*   < > 7.2* 7.6* 7.6*  PROT 4.4*  --  4.5*  --   --   --  4.8*  ALBUMIN 1.8*  --  1.7*  --   --   --  1.7*  AST 65*  --  65*  --   --   --  43*  ALT 21  --  23  --   --   --  27  ALKPHOS 97  --  82  --   --   --  117  BILITOT 0.9  --  0.6  --   --   --  0.7  GFRNONAA 30*   < > 31*   < > 35* 48* 54*  GFRAA 35*   < > 36*   < > 41* 55* >60  ANIONGAP 12   < > 12   < > 5 7 6    < > = values in this interval not displayed.     Hematology Recent Labs  Lab 05/22/19 0418 05/23/19 0325 05/24/19 0413  WBC 8.0 6.7 6.5  RBC 3.26* 3.18* 3.29*  HGB 7.5* 7.3* 7.4*  HCT 25.5* 24.5* 25.5*  MCV 78.2* 77.0* 77.5*  MCH 23.0* 23.0* 22.5*  MCHC 29.4* 29.8* 29.0*  RDW 20.7* 20.7* 20.7*  PLT 249 246 273    Cardiac EnzymesNo results for input(s): TROPONINI in the last 168 hours. No results for input(s): TROPIPOC in the last 168 hours.   BNP Recent Labs  Lab 05/19/19 1404  BNP 633.0*     DDimer No results for input(s): DDIMER in the last 168 hours.   Radiology    [place] ELASTOGRAPHY LIVER  Result Date: 05/21/2019 IMPRESSION: ULTRASOUND RUQ: 1. Mild nodular contour and heterogeneous echogenicity throughout the hepatic parenchyma, suggesting early changes of cirrhosis. 2. Two liver lesions, one of which in the left lobe of the liver appears to represent a complex cyst (as has been  demonstrated on prior CT examinations), while the other lesion in the right lobe of the liver is suspicious for a solid lesion and has not been previously demonstrated. Further evaluation with nonemergent abdominal MRI with and without IV gadolinium is recommended in the near future to exclude the possibility of hepatocellular carcinoma. ULTRASOUND HEPATIC ELASTOGRAPHY: Median kPa:  1.78 Diagnostic category:  < or = 5 kPa: high probability of being normal The use of hepatic elastography is applicable to patients with viral hepatitis and non-alcoholic fatty liver disease. At this time, there is insufficient data for the referenced cut-off values and use in other causes of liver disease, including alcoholic liver disease. Patients, however, may be assessed by elastography and serve as their own reference standard/baseline. In patients with non-alcoholic liver disease, the values suggesting compensated advanced chronic liver disease (cACLD) may be lower, and patients may need additional testing with elasticity results of 7-9 kPa. Please note that abnormal hepatic elasticity and shear wave velocities may also be identified in clinical settings other than with hepatic fibrosis, such as: acute hepatitis, elevated right heart and central venous pressures including use of beta blockers, veno-occlusive disease (Budd-Chiari), infiltrative processes such as mastocytosis/amyloidosis/infiltrative tumor/lymphoma, extrahepatic cholestasis, with hyperemia in the post-prandial state, and with liver transplantation. Correlation with patient history, laboratory data, and clinical condition recommended. Diagnostic Categories: < or =5 kPa: high probability of being normal < or =9 kPa: in the absence of other known clinical signs, rules out cACLD >9 kPa and ?13 kPa: suggestive of cACLD, but needs further testing >13 kPa: highly suggestive of cACLD > or =17 kPa: highly suggestive of cACLD with an increased probability of clinically  significant portal hypertension Electronically Signed   By: Vinnie Langton M.D.   On: 05/21/2019 11:18   US Abdomen Limited RUQ  Result Date: 05/21/2019 IMPRESSION: ULTRASOUND RUQ: 1. Mild nodular contour and heterogeneous echogenicity throughout the hepatic parenchyma, suggesting early changes of cirrhosis. 2. Two liver lesions, one of which in the left lobe  of the liver appears to represent a complex cyst (as has been demonstrated on prior CT examinations), while the other lesion in the right lobe of the liver is suspicious for a solid lesion and has not been previously demonstrated. Further evaluation with nonemergent abdominal MRI with and without IV gadolinium is recommended in the near future to exclude the possibility of hepatocellular carcinoma. ULTRASOUND HEPATIC ELASTOGRAPHY: Median kPa:  1.78 Diagnostic category:  < or = 5 kPa: high probability of being normal The use of hepatic elastography is applicable to patients with viral hepatitis and non-alcoholic fatty liver disease. At this time, there is insufficient data for the referenced cut-off values and use in other causes of liver disease, including alcoholic liver disease. Patients, however, may be assessed by elastography and serve as their own reference standard/baseline. In patients with non-alcoholic liver disease, the values suggesting compensated advanced chronic liver disease (cACLD) may be lower, and patients may need additional testing with elasticity results of 7-9 kPa. Please note that abnormal hepatic elasticity and shear wave velocities may also be identified in clinical settings other than with hepatic fibrosis, such as: acute hepatitis, elevated right heart and central venous pressures including use of beta blockers, veno-occlusive disease (Budd-Chiari), infiltrative processes such as mastocytosis/amyloidosis/infiltrative tumor/lymphoma, extrahepatic cholestasis, with hyperemia in the post-prandial state, and with liver  transplantation. Correlation with patient history, laboratory data, and clinical condition recommended. Diagnostic Categories: < or =5 kPa: high probability of being normal < or =9 kPa: in the absence of other known clinical signs, rules out cACLD >9 kPa and ?13 kPa: suggestive of cACLD, but needs further testing >13 kPa: highly suggestive of cACLD > or =17 kPa: highly suggestive of cACLD with an increased probability of clinically significant portal hypertension Electronically Signed   By: Vinnie Langton M.D.   On: 05/21/2019 11:18    Cardiac Studies   2D echo 05/21/2019: 1. Left ventricular ejection fraction, by estimation, is 35 to 40%. The  left ventricle has moderately decreased function. The left ventricle  demonstrates global hypokinesis. The left ventricular internal cavity size  was moderately to severely dilated.  There is mild left ventricular hypertrophy. Left ventricular diastolic  parameters are indeterminate. There is akinesis of the left ventricular,  basal inferior segment.  2. Right ventricular systolic function is mildly reduced. The right  ventricular size is mildly enlarged. There is moderately elevated  pulmonary artery systolic pressure.  3. Left atrial size was severely dilated.  4. Right atrial size was moderately dilated.  5. Moderate pleural effusion in the left lateral region.  6. The mitral valve is degenerative. Mild to moderate mitral valve  regurgitation.  7. Tricuspid valve regurgitation is severe.  8. The aortic valve has an indeterminant number of cusps. Aortic valve  regurgitation is mild. Mild aortic valve sclerosis is present, with no  evidence of aortic valve stenosis.  9. Aortic dilatation noted. There is mild dilatation of the aortic root  and of the ascending aorta measuring 43 mm.  10. The inferior vena cava is dilated in size with <50% respiratory  variability, suggesting right atrial pressure of 15 mmHg.  __________  2D Echo  04/2018: 1. The left ventricle has moderately reduced systolic function, with an  ejection fraction of 35-40%. The cavity size was moderately dilated. Left  ventricular diastology could not be evaluated.  2. The right ventricle has mildly reduced systolic function. The cavity  was moderately enlarged. There is no increase in right ventricular wall  thickness.  3.  Left atrial size was severely dilated.  4. Right atrial size was severely dilated.  5. The mitral valve is normal in structure. Mitral valve regurgitation is  mild to moderate by color flow Doppler. The MR jet is eccentric  posteriorly directed.  6. The tricuspid valve is normal in structure.  7. The aortic valve is tricuspid Aortic valve regurgitation is mild by  color flow Doppler.  8. The pulmonic valve was normal in structure.  9. There is moderate dilatation of the aortic root and of the ascending  aorta.  10. Moderately dilated pulmonary artery.  11. Pulmonary hypertension is moderate.  12. The inferior vena cava was dilated in size with <50% respiratory  variability.  13. Right atrial pressure is estimated at 15 mmHg.   Patient Profile     85 y.o. male with history of CAD status post remote CABG with subsequent PCI/BMS to the SVG to OM in 2015 with known CTO of the SVG to RCA and patent LIMA to LAD, chronic combined systolic and diastolic CHF, ICM, [employer] CRT-D implanted in 2014 permanent A. fib not on [place] secondary to underlying microcytic anemia with known AVMs as well as falls, prior GI bleed requiring packed red blood cell transfusion with subsequent discovery of aforementioned AVMs, CKD stage III, prostate cancer, HTN, HLD, and deafness who presented with dyspnea and orthopnea.   Assessment & Plan    1. Acute on chronic combined systolic and diastolic CHF/ICM s/p ICD: -He remains volume up, net - 8.3 L for the admission, with weight 102.4-->99.8-->98.7 kg -Volume status remains elevated, though  improving -Continue IV Lasix to 80 mg bid  -GDMT has been held since admission secondary to relative hypotension and AKI, resume as able -Device appears to be functioning normally, follow up with EP as directed  -Tolerating addition of spironolactone as below -CHF education -Strict I/O with daily standing weights  2. CAD s/p CABG with subsequent PCI with demand ischemia: -Never with chest pain -Minimal troponin bump in the setting of known multivessel CAD with volume overload and AKI -Echo with stable cardiomyopathy as above -No plans for inpatient ischemic evaluation at this time -Resume PTA Coreg, Imdur, and statin when able  3. Permanent Afib: -Rate well controlled without rate limiting medications at this time -Not on [place] secondary to prior GI with known AVMs -CHADS2VASc at least 5 (CHF, HTN, age x 2, vascular disease)  4. Acute on CKD stage III: -Improving  -Close monitoring with diuresis   5. Anasarca/cirrhosis/liver nodule: -GI has been consulted -Spironolactone titrated to 100 mg daily  -Lasix as above -Will need liver biopsy   6. HTN: -Blood pressure remains on the soft side -Holding PTA medications   7. Language barrier: -ASL video interpreter used   8. Anemia with known AVM: -HGB low, though stable -Transfuse per IM -No OAC as above   For questions or updates, please contact [place] Please consult [email or web address] for contact info under Cardiology/STEMI.    Signed, Christell Faith, PA-C [place] Pager: [phone] 05/24/2019, 8:10 AM

## 2019-05-25 DIAGNOSIS — R188 Other ascites: Secondary | ICD-10-CM

## 2019-05-25 DIAGNOSIS — I25708 Atherosclerosis of coronary artery bypass graft(s), unspecified, with other forms of angina pectoris: Secondary | ICD-10-CM

## 2019-05-25 DIAGNOSIS — K7460 Unspecified cirrhosis of liver: Secondary | ICD-10-CM

## 2019-05-25 DIAGNOSIS — I1310 Hypertensive heart and chronic kidney disease without heart failure, with stage 1 through stage 4 chronic kidney disease, or unspecified chronic kidney disease: Secondary | ICD-10-CM

## 2019-05-25 LAB — BASIC METABOLIC PANEL
Anion gap: 6 (ref 5–15)
BUN: 28 mg/dL — ABNORMAL HIGH (ref 8–23)
CO2: 31 mmol/L (ref 22–32)
Calcium: 7.6 mg/dL — ABNORMAL LOW (ref 8.9–10.3)
Chloride: 103 mmol/L (ref 98–111)
Creatinine, Ser: 1.21 mg/dL (ref 0.61–1.24)
GFR calc Af Amer: >60 mL/min (ref >60)
GFR calc non Af Amer: 55 mL/min — ABNORMAL LOW (ref >60)
Glucose, Bld: 98 mg/dL (ref 70–99)
Potassium: 4 mmol/L (ref 3.5–5.1)
Sodium: 140 mmol/L (ref 135–145)

## 2019-05-25 LAB — CBC
HCT: 24.6 % — ABNORMAL LOW (ref 39.0–52.0)
Hemoglobin: 7.2 g/dL — ABNORMAL LOW (ref 13.0–17.0)
MCH: 22.5 pg — ABNORMAL LOW (ref 26.0–34.0)
MCHC: 29.3 g/dL — ABNORMAL LOW (ref 30.0–36.0)
MCV: 76.9 fL — ABNORMAL LOW (ref 80.0–100.0)
Platelets: 271 10*3/uL (ref 150–400)
RBC: 3.2 MIL/uL — ABNORMAL LOW (ref 4.22–5.81)
RDW: 20.4 % — ABNORMAL HIGH (ref 11.5–15.5)
WBC: 6.5 10*3/uL (ref 4.0–10.5)
nRBC: 0.3 % — ABNORMAL HIGH (ref 0.0–0.2)

## 2019-05-25 LAB — PREPARE RBC (CROSSMATCH)

## 2019-05-25 LAB — MAGNESIUM: Magnesium: 2.2 mg/dL (ref 1.7–2.4)

## 2019-05-25 MED ORDER — LOSARTAN POTASSIUM 25 MG PO TABS
25.0000 mg | ORAL_TABLET | Freq: Every day | ORAL | Status: DC
  Administered 2019-05-26 – 2019-05-27 (×2): 25 mg via ORAL
  Filled 2019-05-25 (×2): qty 1

## 2019-05-25 MED ORDER — CARVEDILOL 3.125 MG PO TABS
3.1250 mg | ORAL_TABLET | Freq: Two times a day (BID) | ORAL | Status: DC
  Administered 2019-05-26 – 2019-05-29 (×5): 3.125 mg via ORAL
  Filled 2019-05-25 (×7): qty 1

## 2019-05-25 MED ORDER — SPIRONOLACTONE 25 MG PO TABS
25.0000 mg | ORAL_TABLET | Freq: Every day | ORAL | Status: DC
  Administered 2019-05-26 – 2019-05-27 (×2): 25 mg via ORAL
  Filled 2019-05-25 (×2): qty 1

## 2019-05-25 MED ORDER — SODIUM CHLORIDE 0.9% IV SOLUTION: Freq: Once | INTRAVENOUS | Status: DC

## 2019-05-25 NOTE — Progress Notes (Signed)
Discussed blood transfusion with patient- included a handout to watch for S&S.  Consent signed  Patient has had blood before, understand what to watch for.     Per MD- will give night dose of lasix at the end of blood transfusion.

## 2019-05-25 NOTE — Progress Notes (Signed)
PROGRESS NOTE    Thomas [name]  [id number] DOB: [birth year] DOA: 05/19/2019  PCP: [name], MD    LOS - 6   Brief Narrative:  85 year old deaf male requiring ASL interpretation with history of combined systolic/diastolic heart failure, atrial flutter status post biventricular pacemaker, CAD, HTN was admitted 05/19/2019 with anasarca, cough and shortness of breath. Covid is negative. BNP is elevated 638. Work-up has revealed a cirrhotic liver. He is getting diuresed with daily weights and I's and O's. Cardiology consulting.   Subjective 3/11: Patient seen and examined at bedside with assistance of ASL interpreter.  No acute events reported overnight.  Patient reports feeling a little better.  Denies chest pain or shortness of breath, abdominal pain, nausea vomiting or diarrhea, fevers or chills.  We discussed plan for medication changes today per cardiology, he is agreeable.  Discussed blood transfusion with patient and he agrees to this as well.  Assessment & Plan:   Principal Problem:   Anasarca Active Problems:   CAD Status post coronary artery bypass grafting: 1995   Permanent atrial fibrillation (HCC)   Chronic combined systolic and diastolic heart failure (HCC)   Benign essential HTN   CAD S/P multiple PCIs   Elevated troponin   Acute on chronic systolic CHF (congestive heart failure) (HCC)   CRI (chronic renal insufficiency), stage 3 (moderate)   Decompensated hepatic cirrhosis (HCC)   Anasarca --continue increased dose Lasix 80 twice daily. --continue spironolactone --strict I/O's and daily weights --monitor renal function closely, risk for cardiorenal syndrome with aggressive diuresis   Acute decompensation of combined systolic/diastolic CHF Starling forces apparently improved with diuresis, creatininecontinues to improve.  Has moderate pleural effusions bilaterally, will defer thoracentesis for now and continue diuresis.  Net fluid balance -8.5 L to  date. --Cardiology following --diuresis as above --daily weights & strick I/O's --transfuse RBC's as below --resume losartan and Coreg  --reduce spironolactone to 25mg   Cirrhosis Patient with early cirrhosis on ultrasound, without ascites seen. --continue diuresis with Lasix and Aldactone --monitor potassium, replace as needed for K>4.0  Acute on Chronic Microcytic Anemia - baseline Hbg appears to be 7.9-8.6, now in low 7's.  Has known GI AVM's, so likely some chronic blood loss.  Patient with SOB worse than baseline recently, and persistent despite diuresis, concern for symptomatic anemia contributing.  Patient agreeable to RBC transfusion after discussion of risks/benefits. --type & cross --transfuse 1 unit pRBC's --post-transfusion H&H --will get his IV lasix this evening, do not expect he will require extra lasix --anemia panel with AM labs  "Black tarry stools" per nursing report - resolved History of GI bleed Patient hemodynamically stable,H&H stable, but does have a prior history of GI bleed. --monitor H&H --consider GI consult if further drop in Hbg or ongoing bleeding --recommend outpatient GI follow up --avoid all NSAIDs and antiplatelets  Acute kidney injury superimposed on CKD stage IIIa Creatinine improved with diuresis given likely improvement in Starling forces.  Will need to avoid hepatorenal syndrome so diuresis must be judicious. --monitor renal function and electrolytes  Liver nodule Discussed liver nodule with interventional radiology, they do not think this is a particularly concerningfinding, possibly over-read.  Recommendation is for noncontrasted MRI to be done after patient is adequately diuresed (so he can lie still and flat during MRI) to see if this nodule is in fact a nodule or just part of the heterogenous architecture of his liver.  Cough with recent Covid exposure -improving with diuresis.  COVID-19 PCR negative.  Chest  CT without  infiltrates.  Procalcitonin was mildly elevated but patient afebrile and without leukocytosis or other clinical signs of infection.  Empiric antibiotics started on admission discontinued. --Monitor for signs symptoms of infection --Follow CBC  Elevated troponin -secondary to demand ischemia per cardiology. Troponin peaked at 411 and trended down.  Patient without chest pain.  Atrial fibrillation -with biventricular pacemaker.  Not on anticoagulation presumably due to history of recurrent GI bleeding.  Rate controlled without medication. --Monitor  Essential hypertension -currently with borderline soft blood pressures --Hold antihypertensives   DVT prophylaxis: Lovenox   Code Status: Full Code  Family Communication: none at bedside during encounter   Disposition Plan:  To SNF pending further improvement in volume status and clearance by cardiology.  Continues to require IV diuresis with volume status not yet at baseline. Coming From home Exp DC Date 3/13 Barriers as above Medically Stable for Discharge? no   Consultants:   Cardiology  Procedures:   None  Antimicrobials:   Rocephin & Azithromycin empirically on admission, discontinued after 1 day    Objective: Vitals:   05/24/19 1545 05/24/19 1941 05/24/19 2317 05/25/19 0400  BP: 112/61 106/64  108/60  Pulse: 66 61 64 62  Resp: 16 16  16   Temp: 98 F (36.7 C) 98.2 F (36.8 C)  98.1 F (36.7 C)  TempSrc: Oral Oral  Oral  SpO2: 95% (!) 89% 94% 94%  Weight:    97.6 kg  Height:        Intake/Output Summary (Last 24 hours) at 05/25/2019 0759 Last data filed at 05/24/2019 0815 Gross per 24 hour  Intake --  Output 200 ml  Net -200 ml   Filed Weights   05/23/19 0521 05/24/19 0327 05/25/19 0400  Weight: 99.8 kg 98.7 kg 97.6 kg    Examination:  General exam: awake, alert, no acute distress HEENT: deaf, clear conjunctiva, anicteric sclera, moist mucus membranes, hearing grossly normal  Respiratory system:  CTAB diminished at bases, no wheezes or rhonchi, normal respiratory effort. Cardiovascular system: normal S1/S2, RRR, tense pitting lower extremity edema.   Gastrointestinal system: soft, NT, ND, no HSM felt, +bowel sounds. Central nervous system: A&O x3. no gross focal neurologic deficits, normal speech Extremities: moves all, no cyanosis, normal tone Psychiatry: normal mood, congruent affect, judgement and insight appear normal    Data Reviewed: I have personally reviewed following labs and imaging studies  CBC: Recent Labs  Lab 05/21/19 0426 05/22/19 0418 05/23/19 0325 05/24/19 0413 05/25/19 0348  WBC 9.0 8.0 6.7 6.5 6.5  NEUTROABS 6.9  --   --   --   --   HGB 7.6* 7.5* 7.3* 7.4* 7.2*  HCT 25.6* 25.5* 24.5* 25.5* 24.6*  MCV 76.4* 78.2* 77.0* 77.5* 76.9*  PLT 227 249 246 273 [id number]   Basic Metabolic Panel: Recent Labs  Lab 05/21/19 0426 05/22/19 0410 05/23/19 0325 05/24/19 0413 05/25/19 0348  NA 141 138 140 142 140  K 3.9 3.7 3.5 3.8 4.0  CL 103 105 105 105 103  CO2 26 28 28 31 31   GLUCOSE 135* 126* 107* 121* 98  BUN 61* 59* 44* 35* 28*  CREATININE 1.96* 1.77* 1.37* 1.23 1.21  CALCIUM 7.8* 7.2* 7.6* 7.6* 7.6*  MG  --   --   --   --  2.2   GFR: Estimated Creatinine Clearance: 54.7 mL/min (by C-G formula based on SCr of 1.21 mg/dL). Liver Function Tests: Recent Labs  Lab 05/19/19 2047 05/20/19 [id number] 05/21/19 0426 05/24/19 0413  AST 48* 65* 65* 43*  ALT 17 21 23 27   ALKPHOS 95 97 82 117  BILITOT 0.9 0.9 0.6 0.7  PROT 4.4* 4.4* 4.5* 4.8*  ALBUMIN 1.7* 1.8* 1.7* 1.7*   No results for input(s): LIPASE, AMYLASE in the last 168 hours. No results for input(s): AMMONIA in the last 168 hours. Coagulation Profile: Recent Labs  Lab 05/21/19 0426  INR 1.4*   Cardiac Enzymes: No results for input(s): CKTOTAL, CKMB, CKMBINDEX, TROPONINI in the last 168 hours. BNP (last 3 results) No results for input(s): PROBNP in the last 8760 hours. HbA1C: No results for  input(s): HGBA1C in the last 72 hours. CBG: No results for input(s): GLUCAP in the last 168 hours. Lipid Profile: No results for input(s): CHOL, HDL, LDLCALC, TRIG, CHOLHDL, LDLDIRECT in the last 72 hours. Thyroid Function Tests: No results for input(s): TSH, T4TOTAL, FREET4, T3FREE, THYROIDAB in the last 72 hours. Anemia Panel: No results for input(s): VITAMINB12, FOLATE, FERRITIN, TIBC, IRON, RETICCTPCT in the last 72 hours. Sepsis Labs: Recent Labs  Lab 05/19/19 2048  PROCALCITON 2.09    Recent Results (from the past 240 hour(s))  SARS CORONAVIRUS 2 (TAT 6-24 HRS) Nasopharyngeal Nasopharyngeal Swab     Status: None   Collection Time: 05/19/19  3:53 PM   Specimen: Nasopharyngeal Swab  Result Value Ref Range Status   SARS Coronavirus 2 NEGATIVE NEGATIVE Final    Comment: (NOTE) SARS-CoV-2 target nucleic acids are NOT DETECTED. The SARS-CoV-2 RNA is generally detectable in upper and lower respiratory specimens during the acute phase of infection. Negative results do not preclude SARS-CoV-2 infection, do not rule out co-infections with other pathogens, and should not be used as the sole basis for treatment or other patient management decisions. Negative results must be combined with clinical observations, patient history, and epidemiological information. The expected result is Negative. Fact Sheet for Patients: SugarRoll.be Fact Sheet for Healthcare Providers: https://www.woods-mathews.com/ This test is not yet approved or cleared by the [place] FDA and  has been authorized for detection and/or diagnosis of SARS-CoV-2 by FDA under an Emergency Use Authorization (EUA). This EUA will remain  in effect (meaning this test can be used) for the duration of the COVID-19 declaration under Section 56 4(b)(1) of the Act, 21 U.S.C. section 360bbb-3(b)(1), unless the authorization is terminated or revoked sooner. Performed at [facility], [place] [address] [place], [place] [address]          Radiology Studies: No results found.      Scheduled Meds: . enoxaparin (LOVENOX) injection  40 mg Subcutaneous Q24H  . feeding supplement (ENSURE ENLIVE)  237 mL Oral TID BM  . furosemide  80 mg Intravenous BID  . multivitamin with minerals  1 tablet Oral Daily  . sodium chloride flush  3 mL Intravenous Q12H  . spironolactone  100 mg Oral Daily   Continuous Infusions: . sodium chloride Stopped (05/20/19 0542)     LOS: 6 days    Time spent: 35 minutes    Ezekiel Slocumb, DO [facility]   If 7PM-7AM, please contact night-coverage [email or web address] 05/25/2019, 7:59 AM

## 2019-05-25 NOTE — Progress Notes (Signed)
Progress Note  Patient Name: Thomas [name] Date of Encounter: 05/25/2019  Primary Cardiologist: Sanda Klein, MD   Subjective   No complaints this morning, continued leg swelling Reports his leg swelling is coming down but still not at baseline Eating well Able to ambulate Worked with physical therapy yesterday, unable to stand secondary to weakness and pain  Inpatient Medications    Scheduled Meds: . enoxaparin (LOVENOX) injection  40 mg Subcutaneous Q24H  . feeding supplement (ENSURE ENLIVE)  237 mL Oral TID BM  . furosemide  80 mg Intravenous BID  . multivitamin with minerals  1 tablet Oral Daily  . sodium chloride flush  3 mL Intravenous Q12H  . spironolactone  100 mg Oral Daily   Continuous Infusions: . sodium chloride Stopped (05/20/19 0542)   PRN Meds: sodium chloride, acetaminophen **OR** acetaminophen, bisacodyl, oxyCODONE, polyethylene glycol, sodium chloride flush   Vital Signs    Vitals:   05/24/19 1941 05/24/19 2317 05/25/19 0400 05/25/19 0823  BP: 106/64  108/60 (!) 113/51  Pulse: 61 64 62 64  Resp: 16  16 17   Temp: 98.2 F (36.8 C)  98.1 F (36.7 C) 98.1 F (36.7 C)  TempSrc: Oral  Oral Oral  SpO2: (!) 89% 94% 94% 96%  Weight:   97.6 kg   Height:       No intake or output data in the 24 hours ending 05/25/19 0925 Last 3 Weights 05/25/2019 05/24/2019 05/23/2019  Weight (lbs) 215 lb 3.2 oz 217 lb 8 oz 220 lb 1.6 oz  Weight (kg) 97.614 kg 98.657 kg 99.837 kg      Telemetry    Normal sinus rhythm- Personally Reviewed  ECG    - Personally Reviewed  Physical Exam   GEN: No acute distress.   Neck: No JVD Cardiac: RRR, no murmurs, rubs, or gallops.  1-2+ pitting edema to below the knees bilaterally Respiratory: Clear to auscultation bilaterally. GI: Soft, nontender, non-distended  MS: No edema; No deformity. Neuro:  Nonfocal  Psych: Normal affect   Labs    High Sensitivity Troponin:   Recent Labs  Lab 05/19/19 1553 05/19/19 2047  05/20/19 0054 05/20/19 0637 05/20/19 1321  TROPONINIHS 332* 390* 397* 411* 339*      Chemistry Recent Labs  Lab 05/20/19 [id number] 05/20/19 [id number] 05/21/19 0426 05/22/19 0410 05/23/19 0325 05/24/19 0413 05/25/19 0348  NA 142   < > 141   < > 140 142 140  K 4.2   < > 3.9   < > 3.5 3.8 4.0  CL 106   < > 103   < > 105 105 103  CO2 24   < > 26   < > 28 31 31   GLUCOSE 89   < > 135*   < > 107* 121* 98  BUN 49*   < > 61*   < > 44* 35* 28*  CREATININE 1.99*   < > 1.96*   < > 1.37* 1.23 1.21  CALCIUM 7.8*   < > 7.8*   < > 7.6* 7.6* 7.6*  PROT 4.4*  --  4.5*  --   --  4.8*  --   ALBUMIN 1.8*  --  1.7*  --   --  1.7*  --   AST 65*  --  65*  --   --  43*  --   ALT 21  --  23  --   --  27  --   ALKPHOS 97  --  82  --   --  117  --   BILITOT 0.9  --  0.6  --   --  0.7  --   GFRNONAA 30*   < > 31*   < > 48* 54* 55*  GFRAA 35*   < > 36*   < > 55* >60 >60  ANIONGAP 12   < > 12   < > 7 6 6    < > = values in this interval not displayed.     Hematology Recent Labs  Lab 05/23/19 0325 05/24/19 0413 05/25/19 0348  WBC 6.7 6.5 6.5  RBC 3.18* 3.29* 3.20*  HGB 7.3* 7.4* 7.2*  HCT 24.5* 25.5* 24.6*  MCV 77.0* 77.5* 76.9*  MCH 23.0* 22.5* 22.5*  MCHC 29.8* 29.0* 29.3*  RDW 20.7* 20.7* 20.4*  PLT 246 273 271    BNP Recent Labs  Lab 05/19/19 1404  BNP 633.0*     DDimer No results for input(s): DDIMER in the last 168 hours.   Radiology    No results found.  Cardiac Studies  Echocardiogram . Left ventricular ejection fraction, by estimation, is 35 to 40%. The  left ventricle has moderately decreased function. The left ventricle  demonstrates global hypokinesis. The left ventricular internal cavity size  was moderately to severely dilated.  There is mild left ventricular hypertrophy. Left ventricular diastolic  parameters are indeterminate. There is akinesis of the left ventricular,  basal inferior segment.  2. Right ventricular systolic function is mildly reduced. The right    ventricular size is mildly enlarged. There is moderately elevated  pulmonary artery systolic pressure.  3. Left atrial size was severely dilated.  4. Right atrial size was moderately dilated.  5. Moderate pleural effusion in the left lateral region.  6. The mitral valve is degenerative. Mild to moderate mitral valve  regurgitation.  7. Tricuspid valve regurgitation is severe.  8. The aortic valve has an indeterminant number of cusps. Aortic valve  regurgitation is mild. Mild aortic valve sclerosis is present, with no  evidence of aortic valve stenosis.  9. Aortic dilatation noted. There is mild dilatation of the aortic root  and of the ascending aorta measuring 43 mm.  10. The inferior vena cava is dilated in size with <50% respiratory  variability, suggesting right atrial pressure of 15 mmHg.   Patient Profile     85 y.o. male with history of CAD status post remote CABG with subsequent PCI/BMS to the SVG to OM in 2015 with known CTO of the SVG to RCA and patent LIMA to LAD, chronic combined systolic and diastolic CHF, ICM, Medtronic CRT-D implanted in 2014 permanent A. fib not on [place] secondary to underlying microcytic anemia with known AVMs as well as falls, prior GI bleed requiring packed red blood cell transfusion with subsequent discovery of aforementioned AVMs, CKD stage III, prostate cancer, HTN, HLD, and deafnesswho presented with dyspnea and orthopnea.   Assessment & Plan    1. Acute on chronic combined systolic and diastolic CHF/ICM  net - 8.5 L for the admission,  Weight trending lower 97 kg, baseline unclear Still with significant fluid retention, not at baseline Improving renal function consistent with cardiorenal syndrome -Continue IV Lasix to 80 mg bid  -Daily weights, close monitoring of renal function -Symptoms likely exacerbated by anemia as detailed below Low blood pressure limiting advancing his medications --On arrival was on losartan, carvedilol. We  will look to place him back on losartan carvedilol tomorrow with lower dose spironolactone noted renal function his return to baseline  2. CAD s/p CABG with subsequent PCI with demand ischemia:  multivessel CAD  -Echo with stable cardiomyopathy  Ejection fraction 30 to 35% -Look to restart carvedilol, losartan tomorrow.  Was on these medications on arrival -Hold off on Imdur, monitor pressures on the others first  3. Permanent Afib: -Not on [place] secondary to prior GI with known AVMs -CHADS2VASc at least 5 (CHF, HTN, age x 2, vascular disease) We will look to place back on low-dose Coreg tomorrow  4. Acute on CKD stage III:  Cardiorenal, function improved to baseline  5. Anasarca/cirrhosis/liver nodule: -GI following -Lasix 80 IV twice daily  6. HTN: -Blood pressure remains on the soft side -Holding PTA medications   7. Language barrier: -ASL video interpreter used   8. Anemia with known AVM: Likely contributing to lower extremity edema, shortness of breath, weakness Chronic issue Needs follow-up with hematology   Total encounter time more than 25 minutes  Greater than 50% was spent in counseling and coordination of care with the patient   For questions or updates, please contact [place] Please consult [email or web address] for contact info under        Signed, Ida Rogue, MD  05/25/2019, 9:25 AM

## 2019-05-26 ENCOUNTER — Inpatient Hospital Stay: Payer: PPO

## 2019-05-26 DIAGNOSIS — M7989 Other specified soft tissue disorders: Secondary | ICD-10-CM

## 2019-05-26 LAB — IRON AND TIBC
Iron: 29 ug/dL — ABNORMAL LOW (ref 45–182)
Saturation Ratios: 14 % — ABNORMAL LOW (ref 17.9–39.5)
TIBC: 214 ug/dL — ABNORMAL LOW (ref 250–450)
UIBC: 185 ug/dL

## 2019-05-26 LAB — BASIC METABOLIC PANEL
Anion gap: 7 (ref 5–15)
BUN: 25 mg/dL — ABNORMAL HIGH (ref 8–23)
CO2: 29 mmol/L (ref 22–32)
Calcium: 7.7 mg/dL — ABNORMAL LOW (ref 8.9–10.3)
Chloride: 102 mmol/L (ref 98–111)
Creatinine, Ser: 1.36 mg/dL — ABNORMAL HIGH (ref 0.61–1.24)
GFR calc Af Amer: 56 mL/min — ABNORMAL LOW (ref >60)
GFR calc non Af Amer: 48 mL/min — ABNORMAL LOW (ref >60)
Glucose, Bld: 93 mg/dL (ref 70–99)
Potassium: 4.3 mmol/L (ref 3.5–5.1)
Sodium: 138 mmol/L (ref 135–145)

## 2019-05-26 LAB — CBC
HCT: 27.3 % — ABNORMAL LOW (ref 39.0–52.0)
Hemoglobin: 8.1 g/dL — ABNORMAL LOW (ref 13.0–17.0)
MCH: 22.9 pg — ABNORMAL LOW (ref 26.0–34.0)
MCHC: 29.7 g/dL — ABNORMAL LOW (ref 30.0–36.0)
MCV: 77.1 fL — ABNORMAL LOW (ref 80.0–100.0)
Platelets: 289 10*3/uL (ref 150–400)
RBC: 3.54 MIL/uL — ABNORMAL LOW (ref 4.22–5.81)
RDW: 20.4 % — ABNORMAL HIGH (ref 11.5–15.5)
WBC: 7.2 10*3/uL (ref 4.0–10.5)
nRBC: 0.3 % — ABNORMAL HIGH (ref 0.0–0.2)

## 2019-05-26 LAB — RETICULOCYTES
Immature Retic Fract: 41 % — ABNORMAL HIGH (ref 2.3–15.9)
RBC.: 3.6 MIL/uL — ABNORMAL LOW (ref 4.22–5.81)
Retic Count, Absolute: 59.4 10*3/uL (ref 19.0–186.0)
Retic Ct Pct: 1.7 % (ref 0.4–3.1)

## 2019-05-26 LAB — FERRITIN: Ferritin: 47 ng/mL (ref 24–336)

## 2019-05-26 LAB — FOLATE: Folate: 8.8 ng/mL (ref >5.9)

## 2019-05-26 LAB — MAGNESIUM: Magnesium: 2.3 mg/dL (ref 1.7–2.4)

## 2019-05-26 LAB — VITAMIN B12: Vitamin B-12: 1027 pg/mL — ABNORMAL HIGH (ref 180–914)

## 2019-05-26 NOTE — Progress Notes (Addendum)
Progress Note  Patient Name: Thomas [name] Date of Encounter: 05/26/2019  Primary Cardiologist: Sanda Klein, MD   Subjective   ROS obtained using the Ipad / video interpreter in the room.  He reports racing HR but denies any CP, palpitations, or SOB. No nausea. He does report bilateral LEE but states that he feels it is going down. During physical exam today, he reports that he is unable to sit up due to deconditioning. He also reports a tenderness to palpation when assessing his lower extremities for edema, most significant in the RLE. Left arm infiltrate still with some erythema but he does not currently feel any pain.  He denies any other symptoms. He does not have any questions at this time regarding his plan for cardiac care.   Inpatient Medications    Scheduled Meds: . sodium chloride   Intravenous Once  . carvedilol  3.125 mg Oral BID WC  . enoxaparin (LOVENOX) injection  40 mg Subcutaneous Q24H  . feeding supplement (ENSURE ENLIVE)  237 mL Oral TID BM  . furosemide  80 mg Intravenous BID  . losartan  25 mg Oral Daily  . multivitamin with minerals  1 tablet Oral Daily  . sodium chloride flush  3 mL Intravenous Q12H  . spironolactone  25 mg Oral Daily   Continuous Infusions: . sodium chloride Stopped (05/20/19 0542)   PRN Meds: sodium chloride, acetaminophen **OR** acetaminophen, bisacodyl, oxyCODONE, polyethylene glycol, sodium chloride flush   Vital Signs    Vitals:   05/25/19 2148 05/26/19 0324 05/26/19 0326 05/26/19 0802  BP: (!) 103/54  105/61 106/64  Pulse: 62  66 60  Resp: 19  20 18   Temp: 98.5 F (36.9 C)  98.5 F (36.9 C) 97.8 F (36.6 C)  TempSrc: Oral  Oral Oral  SpO2: 93%  94% 93%  Weight:  94.2 kg    Height:        Intake/Output Summary (Last 24 hours) at 05/26/2019 1035 Last data filed at 05/26/2019 0900 Gross per 24 hour  Intake 1492 ml  Output 2150 ml  Net -658 ml   Last 3 Weights 05/26/2019 05/25/2019 05/24/2019  Weight (lbs) 207 lb  9.6 oz 215 lb 3.2 oz 217 lb 8 oz  Weight (kg) 94.167 kg 97.614 kg 98.657 kg      Telemetry    Paced, rare ectopy - Personally Reviewed  ECG    No new tracings - Personally Reviewed  Physical Exam   GEN: No acute distress.  Watching television. Deaf. Interpreter used.  Neck: JVD difficult to assess 2/2 facial hair, JVP appears elevated. Cardiac: RRR, 2/6 systolic murmur. No rubs or gallops.  Respiratory: Unable to sit up for lung exam but bibasilar reduced breath sounds. R base crackles. GI: Soft, nontender, non-distended  MS: 2+ LLE edema >1+ RLE edema with pain of right lower extremity palpation greater than that of left Neuro:  Nonfocal  Psych: Normal affect   Labs    High Sensitivity Troponin:   Recent Labs  Lab 05/19/19 1553 05/19/19 2047 05/20/19 0054 05/20/19 0637 05/20/19 1321  TROPONINIHS 332* 390* 397* 411* 339*      Chemistry Recent Labs  Lab 05/20/19 [id number] 05/20/19 [id number] 05/21/19 0426 05/22/19 0410 05/24/19 0413 05/25/19 0348 05/26/19 0504  NA 142   < > 141   < > 142 140 138  K 4.2   < > 3.9   < > 3.8 4.0 4.3  CL 106   < > 103   < >  105 103 102  CO2 24   < > 26   < > 31 31 29   GLUCOSE 89   < > 135*   < > 121* 98 93  BUN 49*   < > 61*   < > 35* 28* 25*  CREATININE 1.99*   < > 1.96*   < > 1.23 1.21 1.36*  CALCIUM 7.8*   < > 7.8*   < > 7.6* 7.6* 7.7*  PROT 4.4*  --  4.5*  --  4.8*  --   --   ALBUMIN 1.8*  --  1.7*  --  1.7*  --   --   AST 65*  --  65*  --  43*  --   --   ALT 21  --  23  --  27  --   --   ALKPHOS 97  --  82  --  117  --   --   BILITOT 0.9  --  0.6  --  0.7  --   --   GFRNONAA 30*   < > 31*   < > 54* 55* 48*  GFRAA 35*   < > 36*   < > >60 >60 56*  ANIONGAP 12   < > 12   < > 6 6 7    < > = values in this interval not displayed.     Hematology Recent Labs  Lab 05/24/19 0413 05/24/19 0413 05/25/19 0348 05/26/19 0504  WBC 6.5  --  6.5 7.2  RBC 3.29*   < > 3.20* 3.54*  3.60*  HGB 7.4*  --  7.2* 8.1*  HCT 25.5*  --  24.6*  27.3*  MCV 77.5*  --  76.9* 77.1*  MCH 22.5*  --  22.5* 22.9*  MCHC 29.0*  --  29.3* 29.7*  RDW 20.7*  --  20.4* 20.4*  PLT 273  --  271 289   < > = values in this interval not displayed.    BNP Recent Labs  Lab 05/19/19 1404  BNP 633.0*     DDimer No results for input(s): DDIMER in the last 168 hours.   Radiology    No results found.  Cardiac Studies   Echocardiogram 05/21/19 . Left ventricular ejection fraction, by estimation, is 35 to 40%. The  left ventricle has moderately decreased function. The left ventricle  demonstrates global hypokinesis. The left ventricular internal cavity size  was moderately to severely dilated.  There is mild left ventricular hypertrophy. Left ventricular diastolic  parameters are indeterminate. There is akinesis of the left ventricular,  basal inferior segment.  2. Right ventricular systolic function is mildly reduced. The right  ventricular size is mildly enlarged. There is moderately elevated  pulmonary artery systolic pressure.  3. Left atrial size was severely dilated.  4. Right atrial size was moderately dilated.  5. Moderate pleural effusion in the left lateral region.  6. The mitral valve is degenerative. Mild to moderate mitral valve  regurgitation.  7. Tricuspid valve regurgitation is severe.  8. The aortic valve has an indeterminant number of cusps. Aortic valve  regurgitation is mild. Mild aortic valve sclerosis is present, with no  evidence of aortic valve stenosis.  9. Aortic dilatation noted. There is mild dilatation of the aortic root  and of the ascending aorta measuring 43 mm.  10. The inferior vena cava is dilated in size with <50% respiratory  variability, suggesting right atrial pressure of 15 mmHg.  2D Echo 04/2018: 1.  The left ventricle has moderately reduced systolic function, with an  ejection fraction of 35-40%. The cavity size was moderately dilated. Left  ventricular diastology could not be  evaluated.  2. The right ventricle has mildly reduced systolic function. The cavity  was moderately enlarged. There is no increase in right ventricular wall  thickness.  3. Left atrial size was severely dilated.  4. Right atrial size was severely dilated.  5. The mitral valve is normal in structure. Mitral valve regurgitation is  mild to moderate by color flow Doppler. The MR jet is eccentric  posteriorly directed.  6. The tricuspid valve is normal in structure.  7. The aortic valve is tricuspid Aortic valve regurgitation is mild by  color flow Doppler.  8. The pulmonic valve was normal in structure.  9. There is moderate dilatation of the aortic root and of the ascending  aorta.  10. Moderately dilated pulmonary artery.  11. Pulmonary hypertension is moderate.  12. The inferior vena cava was dilated in size with <50% respiratory  variability.  13. Right atrial pressure is estimated at 15 mmHg.   Patient Profile     85 y.o. male with a history of CAD s/p remote CABG with subsequent PCI/BMS to the SVG to OM 2015 with known CTO of the SVG to RCA and patent LIMA to LAD, chronic combined systolic and diastolic CHF, ICM, Medtronic CRT-D implanted in 2014, permanent Afib not on [place] secondary to underlying microcytic anemia with known AVMs as well as falls, prior GIB requiring PRBC transfusion with subsequent discovery of aforementioned AVMs, CKDIII, prostate CA, HTN, HLD, and deafness and evaluated today for DOE and orthopnea.   Assessment & Plan    Acute on chronic combined systolic and diastolic CHF/ICM --Denies SOB. Remains volume up on exam. Net -9L for the admission with -778cc yesterday. Weight 97.6kg  94.2kg. Volume status improving though elevated. Continue to monitor I/Os, daily standing weights, daily BMET.  --Slight bump in Cr overnight with BUN decreased and CO2 improving. Continue IV lasix 80mg  BID as renal function allows or until euvolemic on exam. He will need  transition to oral diuresis before discharge.  --Given his asymmetric LLE edema on exam with L>R, continue to monitor oxygen saturations closely with low threshold to scan for DVT if needed. Current SpO2 95%. No SOB. He is currently on lovenox. --GDMT has been limited earlier in admission by his hypotension and AKI and we have been resuming as able. Spironolactone added earlier this admission and he has been tolerating it well. Received Coreg 3.125mg  and Losartan 25mg  this AM at 9AM and tolerating well.  --Device checked per review of previous progress notes and appears to be functioning correctly with follow-up with EP recommended as directed.   CAD s/p CABG with PCI  Demand Ischemia --No CP. Minimal elevation in HS Tn in the setting of known multivessel CAD, elevated volume status and AKI. Echo as above with EF stable 30-35%. No current plans for ischemic evaluation. Continue Coreg and statin. It appears that he is no longer on his home Imdur; however, this can continue to be held given his softer pressures.   Permanent Afib --Rate well controlled on Coreg. Not on [place] 2/2 previous GIB and known AVMs. CHA2DS2VASc score of at least  5 (CHF, HTN, age1, vascular).  Acute on chronic CKDIII --Daily BMET. Close monitoring with diuresis.   [place] / cirrhosis / liver nodule --GI consulted. Lasix and spironolactone as above. As previously noted, plan for liver bx.  HTN --Current BP 104/61. Continue PTA medications as BP allows.  Language barrier --ASL video interpreter utilized.  Anemia with known AVM --Daily CBC. Hgb stable at 7.2 with low threshold to transfuse if needed. Transfusion per IM. Not on [place].  L  arm infiltrate --No pain reported. Continue to monitor.   Asymmetric LUE and LLE edema --LUE edema 1/1 infiltrate with LLE edema noted today as above.  For questions or updates, please contact [place] Please consult [email or web address] for contact info under         Signed, [name] Chaco, PA-C  05/26/2019, 10:35 AM

## 2019-05-26 NOTE — NC FL2 (Signed)
[place] LEVEL OF CARE SCREENING TOOL     IDENTIFICATION  Patient Name: Thomas [name] Birthdate: 1934/12/16 Sex: male Admission Date (Current Location): 05/19/2019  [place] and [place] Number:  Engineering geologist and Address:  [facility], [address] [place], [place] [address]      Provider Number: [id number]  Attending Physician Name and Address:  Ezekiel Slocumb, DO  Relative Name and Phone Number:       Current Level of Care: Hospital Recommended Level of Care: [place] Prior Approval Number:    Date Approved/Denied:   PASRR Number:    Discharge Plan: SNF    Current Diagnoses: Patient Active Problem List   Diagnosis Date Noted  . Decompensated hepatic cirrhosis ([place]) 05/22/2019  . Anasarca 05/19/2019  . CRI (chronic renal insufficiency), stage 3 (moderate) 01/30/2019  . Iron deficiency anemia due to chronic blood loss 11/02/2018  . Coronary artery disease of bypass graft of native heart with stable angina pectoris ([place]) 11/02/2018  . Pure hypercholesterolemia 11/02/2018  . Biventricular automatic implantable cardioverter defibrillator in situ 11/02/2018  . GIB (gastrointestinal bleeding) 09/14/2018  . Hypotension 08/14/2018  . Acute on chronic systolic CHF (congestive heart failure) ([place]) 08/12/2018  . Acute on chronic combined systolic and diastolic CHF (congestive heart failure) ([place])   . GI bleed 05/26/2018  . Occult GI bleeding 05/25/2018  . Normocytic anemia 05/25/2018  . Elevated troponin 05/25/2018  . Anemia   . Lymphedema 02/28/2018  . Weakness 07/15/2016  . Fatigue 07/15/2016  . CVA (cerebral infarction) 10/31/2015  . Bulbous urethral stricture 09/18/2015  . Bilateral deafness 08/19/2015  . Bilateral cataracts 08/19/2015  . Acid reflux 08/19/2015  . HLD (hyperlipidemia) 08/19/2015  . Essential hypertension 08/19/2015  . Myocardial infarction ([place]) 08/19/2015  . Calculus of kidney  08/19/2015  . Artificial cardiac pacemaker 08/19/2015  . Gastroduodenal ulcer 08/19/2015  . Dupuytren's contracture of foot 08/19/2015  . Malignant neoplasm of prostate ([place]) 08/19/2015  . Microhematuria 08/19/2015  . CAD S/P multiple PCIs 02/22/2015  . Status post coronary artery bypass grafting 02/22/2015  . Benign essential HTN 04/02/2014  . Hematochezia 02/20/2014  . Tricuspid valve regurgitation, nonrheumatic 02/20/2014  . Mobitz type II atrioventricular block 12/12/2013  . Long term current use of anticoagulant 10/18/2013  . Cardiomyopathy, ischemic 11/11/2012  . CAD Status post coronary artery bypass grafting: 1995 11/11/2012  . Permanent atrial fibrillation ([place]) 11/11/2012  . Biventricular ICD in place 11/11/2012  . Chronic combined systolic and diastolic heart failure ([place]) 11/11/2012    Orientation RESPIRATION BLADDER Height & Weight     Situation, Place, Time, Self  Normal External catheter, Incontinent(placed 3/9) Weight: 207 lb 9.6 oz (94.2 kg) Height:  6\' 2"  (188 cm)  BEHAVIORAL SYMPTOMS/MOOD NEUROLOGICAL BOWEL NUTRITION STATUS      Continent Diet(Diet 2 gram sodium, thin liquids)  AMBULATORY STATUS COMMUNICATION OF NEEDS Skin   Limited Assist Verbally Normal                       Personal Care Assistance Level of Assistance  Bathing, Feeding, Dressing Bathing Assistance: Limited assistance Feeding assistance: Independent Dressing Assistance: Limited assistance     Functional Limitations Info  Speech, Hearing, Sight Sight Info: Adequate Hearing Info: (pt is deaf, can read and perform sign language) Speech Info: Impaired    SPECIAL CARE FACTORS FREQUENCY  PT (By licensed PT), OT (By licensed OT)     PT Frequency: 5x OT  Frequency: 5x            Contractures Contractures Info: Not present    Additional Factors Info  Code Status, Allergies Code Status Info: Full Code Allergies Info: Entresto (Sacubitril-valsartan), Phenazopyridine,  Ramipril           Current Medications (05/26/2019):  This is the current hospital active medication list Current Facility-Administered Medications  Medication Dose Route Frequency Provider Last Rate Last Admin  . 0.9 %  sodium chloride infusion (Manually program via Guardrails IV Fluids)   Intravenous Once Nicole Kindred A, DO      . 0.9 %  sodium chloride infusion  250 mL Intravenous PRN Vashti Hey, MD   Stopped at 05/20/19 0542  . acetaminophen (TYLENOL) tablet 650 mg  650 mg Oral Q6H PRN Benita Gutter, RPH   650 mg at 05/25/19 2225   Or  . acetaminophen (TYLENOL) suppository 650 mg  650 mg Rectal Q6H PRN Benita Gutter, RPH      . bisacodyl (DULCOLAX) EC tablet 5 mg  5 mg Oral Daily PRN Bonnell Public Tublu, MD      . carvedilol (COREG) tablet 3.125 mg  3.125 mg Oral BID WC Minna Merritts, MD   3.125 mg at 05/26/19 0903  . enoxaparin (LOVENOX) injection 40 mg  40 mg Subcutaneous Q24H Bonnell Public Tublu, MD   40 mg at 05/25/19 2227  . feeding supplement (ENSURE ENLIVE) (ENSURE ENLIVE) liquid 237 mL  237 mL Oral TID BM Bonnell Public Tublu, MD   237 mL at 05/24/19 2021  . furosemide (LASIX) injection 80 mg  80 mg Intravenous BID Bonnell Public Tublu, MD   80 mg at 05/26/19 0903  . losartan (COZAAR) tablet 25 mg  25 mg Oral Daily Minna Merritts, MD   25 mg at 05/26/19 0903  . multivitamin with minerals tablet 1 tablet  1 tablet Oral Daily Vashti Hey, MD   1 tablet at 05/25/19 1725  . oxyCODONE (Oxy IR/ROXICODONE) immediate release tablet 5 mg  5 mg Oral Q6H PRN Lang Snow, NP   5 mg at 05/22/19 1528  . polyethylene glycol (MIRALAX / GLYCOLAX) packet 17 g  17 g Oral Daily PRN Bonnell Public Tublu, MD      . sodium chloride flush (NS) 0.9 % injection 3 mL  3 mL Intravenous Q12H Bonnell Public Tublu, MD   3 mL at 05/26/19 0905  . sodium chloride flush (NS) 0.9 % injection 3 mL  3 mL Intravenous PRN Vashti Hey, MD   3 mL at 05/23/19 2009  . spironolactone (ALDACTONE) tablet 25 mg  25 mg Oral Daily Minna Merritts, MD   25 mg at 05/26/19 [id number]     Discharge Medications: Please see discharge summary for a list of discharge medications.  Relevant Imaging Results:  Relevant Lab Results:   Additional Information [id number], pt needs sign language  Mirah Nevins A Amerie Beaumont, LCSW

## 2019-05-26 NOTE — Progress Notes (Signed)
Physical Therapy Treatment Patient Details Name: Thomas [name] MRN: [id number] DOB: 10/26/1934 Today's Date: 05/26/2019    History of Present Illness Pt admitted for anasarca. HIstory includes heart failure, Aflutter, CAD, and HTN.     PT Comments    Pt is making good progress towards goals with improved ambulation distance this date. Somewhat impulsive and needs cues for safety and waiting for interpreter to finish signing what therapist is saying. Pt with urgent needs for bathroom tasks. Assisted and encouraged independence. Pt gets slightly agitated/frustrated to attempt, however then apologizes and thanks therapist for pushing him to do things for himself. Left with empty urinal at bedside and all needs met. Able to participate in B LE there-ex however again self limiting and depends on therapist to assist with there-ex despite sufficient strength to attempt with supervision. Will continue to progress as able.    Follow Up Recommendations  SNF     Equipment Recommendations  None recommended by PT    Recommendations for Other Services       Precautions / Restrictions Precautions Precautions: Fall Restrictions Weight Bearing Restrictions: No    Mobility  Bed Mobility Overal bed mobility: Needs Assistance Bed Mobility: Supine to Sit     Supine to sit: Min assist     General bed mobility comments: needs assist for sliding B LEs off bed and trunk assist. Once seated, able to sit with upright posture while at EOB  Transfers Overall transfer level: Needs assistance Equipment used: Rolling walker (2 wheeled) Transfers: Sit to/from Stand Sit to Stand: Min guard         General transfer comment: safe technique with ability to push from seated surface. Once standing, upright posture. Shoes donned prior to initiating mobility  Ambulation/Gait Ambulation/Gait assistance: Min guard Gait Distance (Feet): 40 Feet Assistive device: Rolling walker (2 wheeled) Gait  Pattern/deviations: Step-through pattern     General Gait Details: ambulated in room, impulsive and difficult to redirect using interpreter. Pt with urgent bathroom needs. Returned back to bed. Used RW   Stairs             Wheelchair Mobility    Modified Rankin (Stroke Patients Only)       Balance Overall balance assessment: History of Falls;Needs assistance Sitting-balance support: Feet supported Sitting balance-Leahy Scale: Good     Standing balance support: Bilateral upper extremity supported Standing balance-Leahy Scale: Good                              Cognition Arousal/Alertness: Awake/alert Behavior During Therapy: Impulsive Overall Cognitive Status: Within Functional Limits for tasks assessed                                        Exercises Other Exercises Other Exercises: Supine ther-ex performed on B LE including SLRs, hip abd/add, and knee flexion. Needs min assist for performance and reports discomfort with exertion. Other Exercises: Ambulated to [facility] with BM. Needs heavy cues for participation in hygiene as pt unwilling to attempt. Educated on ADLs instruction and pt able to wipe his bottom with min assist. Pt able to use urinal with cga.    General Comments        Pertinent Vitals/Pain Pain Assessment: Faces Faces Pain Scale: Hurts little more Pain Location: B LEs Pain Descriptors / Indicators: Constant;Discomfort Pain Intervention(s): Limited activity within  patient's tolerance    Home Living                      Prior Function            PT Goals (current goals can now be found in the care plan section) Acute Rehab PT Goals Patient Stated Goal: to be able to walk again PT Goal Formulation: With patient Time For Goal Achievement: 06/07/19 Potential to Achieve Goals: Good Progress towards PT goals: Progressing toward goals    Frequency    Min 2X/week      PT Plan Current plan remains  appropriate    Co-evaluation              AM-PAC PT "6 Clicks" Mobility   Outcome Measure  Help needed turning from your back to your side while in a flat bed without using bedrails?: A Little Help needed moving from lying on your back to sitting on the side of a flat bed without using bedrails?: A Little Help needed moving to and from a bed to a chair (including a wheelchair)?: A Little Help needed standing up from a chair using your arms (e.g., wheelchair or bedside chair)?: A Little Help needed to walk in hospital room?: A Little Help needed climbing 3-5 steps with a railing? : A Lot 6 Click Score: 17    End of Session Equipment Utilized During Treatment: Gait belt Activity Tolerance: Patient tolerated treatment well Patient left: in bed;with bed alarm set Nurse Communication: Mobility status PT Visit Diagnosis: Muscle weakness (generalized) (M62.81);Repeated falls (R29.6);Difficulty in walking, not elsewhere classified (R26.2);Pain Pain - Right/Left: (bilat) Pain - part of body: Leg     Time: 1330-1409 PT Time Calculation (min) (ACUTE ONLY): 39 min  Charges:  $Gait Training: 8-22 mins $Therapeutic Exercise: 8-22 mins $Therapeutic Activity: 8-22 mins                     [name], PT, DPT [phone]    Thomas [name] 05/26/2019, 3:37 PM

## 2019-05-26 NOTE — Progress Notes (Addendum)
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 Covid is negative. BNP is elevated 638. Work-up has revealed a cirrhotic liver. He is getting diuresed with daily weights and I's and O's.Cardiology consulting.  Subjective 3/12: No acute events reported.  Denies chest pain, SOB. Says leg swelling feels better, but both still with significant edema.  Reports feeling very weak.  Denies CP, N/V/D or other complaints.  Assessment & Plan:   Principal Problem:   Anasarca Active Problems:   CAD Status post coronary artery bypass grafting: 1995   Permanent atrial fibrillation (HCC)   Chronic combined systolic and diastolic heart failure (HCC)   Benign essential HTN   CAD S/P multiple PCIs   Elevated troponin   Acute on chronic systolic CHF (congestive heart failure) (HCC)   CRI (chronic renal insufficiency), stage 3 (moderate)   Decompensated hepatic cirrhosis (HCC)    Acute decompensation ofcombined systolic/diastolicCHF Anasarca Starling forces apparently improved with diuresis, creatininecontinues to improve.Has moderate pleural effusions bilaterally, will defer thoracentesis for now and continue diuresis.  Net fluid balance -9.5 L to date. --Cardiology following --continue Lasix 80 mg IV BID --continue spironolactone --daily weights & strick I/O's --transfuse RBC's as below --resume losartan and Coreg  --reduce spironolactone to 25mg  --monitor renal function closely, risk for cardiorenal syndrome with aggressive diuresis --will get lower extremity dopplers to assess for DVT given persistent edema despite 9L fluid off since admission.  Is on  prophylaxis.  Cirrhosis Patient with early cirrhosis on ultrasound, without ascites seen. --continue diuresis with Lasix and Aldactone --monitor potassium, replace as needed for K>4.0  Acute on Chronic Microcytic Anemia - baseline Hbg appears to be 7.9-8.6, now in low 7's.  Has known GI AVM's, so likely some chronic blood loss.  Patient with SOB worse than baseline recently, and persistent despite diuresis, concern for symptomatic anemia contributing.  Patient agreeable to RBC transfusion after discussion of risks/benefits. --type & cross --transfuse 1 unit pRBC's --post-transfusion H&H --will get his IV lasix this evening, do not expect he will require extra lasix --anemia panel with AM labs  "Black tarry stools" per nursing report- resolved History of GI bleed Patient hemodynamically stable,H&H stable, butdoes have apriorhistory of GI bleed. --monitor H&H --consider GI consult if further drop in Hbg or ongoing bleeding --recommend outpatient GI follow up --avoidallNSAIDs and antiplatelets  Acutekidney injury superimposed onCKDstage IIIa Creatinine improved with diuresis given likely improvement in Starling forces.  Will need to avoid hepatorenal syndrome so diuresis must be judicious. --monitor renal function and electrolytes  Liver nodule Discussed liver nodule with interventional radiology, they do not think this is a particularly concerningfinding, possibly over-read. Recommendation is for noncontrasted MRI to be done after patient is adequately diuresed (so he can lie still and flat during MRI) to see if this nodule is in fact a nodule or just part of the heterogenous architecture of his liver.  Cough with recent Covid exposure-improving with diuresis. COVID-19 PCR negative. Chest CT without infiltrates. Procalcitonin was mildly elevated but patient afebrile and without leukocytosis or other clinical signs of infection. Empiric antibiotics started on  admission discontinued. --Monitor for signs symptoms of infection --Follow CBC  Elevated troponin-secondary to demand ischemia per cardiology.  Troponin peaked at 411 andtrended down. Patient without chest pain.  Atrial fibrillation-with biventricular pacemaker.Not on anticoagulation presumably due to history of recurrent GI bleeding. Rate controlled without medication. --Monitor  Essential hypertension-currently with borderline soft blood pressures --Hold antihypertensives   DVT prophylaxis: Lovenox   Code Status: Full Code  Family Communication: none at bedside during encounter.  Wife updated at bedside with help of ASL interpreter in the late afternoon.  All questions answered.  Her primary concern is being able to visit patient at facility and that he be able to stand and walk again.  Disposition Plan:  To SNF pending further improvement in volume status and clearance by cardiology.  Continues to require IV diuresis with volume status not yet at baseline. Coming From home Exp DC Date 3/13 Barriers as above Medically Stable for Discharge? no   Consultants:   Cardiology  Procedures:   None   Antimicrobials:   Rocephin & Azithromycin empirically on admission, discontinued after 1 day   Objective: Vitals:   05/26/19 0324 05/26/19 0326 05/26/19 0802 05/26/19 1139  BP:  105/61 106/64 104/61  Pulse:  66 60 60  Resp:  20 18 20   Temp:  98.5 F (36.9 C) 97.8 F (36.6 C) 98.3 F (36.8 C)  TempSrc:  Oral Oral Oral  SpO2:  94% 93% 95%  Weight: 94.2 kg     Height:        Intake/Output Summary (Last 24 hours) at 05/26/2019 1410 Last data filed at 05/26/2019 1138 Gross per 24 hour  Intake 1132 ml  Output 2575 ml  Net -1443 ml   Filed Weights   05/24/19 0327 05/25/19 0400 05/26/19 0324  Weight: 98.7 kg 97.6 kg 94.2 kg    Examination:  General exam: awake, alert, no acute distress HEENT: moist mucus membranes, hearing grossly normal    Respiratory system: CTAB but diminished at bases, no wheezes, rales or rhonchi, normal respiratory effort. Cardiovascular system: normal S1/S2, RRR, no pedal edema.   Extremities: L>R pitting edema of lower extremities not weaping, normal tone Skin: dry, intact, normal temperature, venous stasis changes of lower extremities left>right Psychiatry: normal mood, congruent affect, judgement and insight appear normal    Data Reviewed: I have personally reviewed following labs and imaging studies  CBC: Recent Labs  Lab 05/21/19 0426 05/21/19 0426 05/22/19 0418 05/23/19 0325 05/24/19 0413 05/25/19 0348 05/26/19 0504  WBC 9.0   < > 8.0 6.7 6.5 6.5 7.2  NEUTROABS 6.9  --   --   --   --   --   --   HGB 7.6*   < > 7.5* 7.3* 7.4* 7.2* 8.1*  HCT 25.6*   < > 25.5* 24.5* 25.5* 24.6* 27.3*  MCV 76.4*   < > 78.2* 77.0* 77.5* 76.9* 77.1*  PLT 227   < > 249 246 273 271 289   < > = values in this interval not displayed.   Basic Metabolic Panel: Recent Labs  Lab 05/22/19 0410 05/23/19 0325 05/24/19 0413 05/25/19 0348 05/26/19 0504  NA 138 140 142 140 138  K 3.7 3.5 3.8 4.0 4.3  CL 105 105 105 103 102  CO2 28 28 31 31 29   GLUCOSE 126* 107* 121* 98 93  BUN 59* 44* 35* 28* 25*  CREATININE 1.77* 1.37* 1.23 1.21 1.36*  CALCIUM 7.2* 7.6* 7.6* 7.6* 7.7*  MG  --   --   --  2.2 2.3   GFR: Estimated Creatinine Clearance: 48.7 mL/min (A) (by C-G formula  based on SCr of 1.36 mg/dL (H)). Liver Function Tests: Recent Labs  Lab 05/19/19 2047 05/20/19 0637 05/21/19 0426 05/24/19 0413  AST 48* 65* 65* 43*  ALT 17 21 23 27   ALKPHOS 95 97 82 117  BILITOT 0.9 0.9 0.6 0.7  PROT 4.4* 4.4* 4.5* 4.8*  ALBUMIN 1.7* 1.8* 1.7* 1.7*   No results for input(s): LIPASE, AMYLASE in the last 168 hours. No results for input(s): AMMONIA in the last 168 hours. Coagulation Profile: Recent Labs  Lab 05/21/19 0426  INR 1.4*   Cardiac Enzymes: No results for input(s): CKTOTAL, CKMB, CKMBINDEX, TROPONINI  in the last 168 hours. BNP (last 3 results) No results for input(s): PROBNP in the last 8760 hours. HbA1C: No results for input(s): HGBA1C in the last 72 hours. CBG: No results for input(s): GLUCAP in the last 168 hours. Lipid Profile: No results for input(s): CHOL, HDL, LDLCALC, TRIG, CHOLHDL, LDLDIRECT in the last 72 hours. Thyroid Function Tests: No results for input(s): TSH, T4TOTAL, FREET4, T3FREE, THYROIDAB in the last 72 hours. Anemia Panel: Recent Labs    05/26/19 0504  VITAMINB12 1,027*  FOLATE 8.8  FERRITIN 47  TIBC 214*  IRON 29*  RETICCTPCT 1.7   Sepsis Labs: Recent Labs  Lab 05/19/19 2048  PROCALCITON 2.09    Recent Results (from the past 240 hour(s))  SARS CORONAVIRUS 2 (TAT 6-24 HRS) Nasopharyngeal Nasopharyngeal Swab     Status: None   Collection Time: 05/19/19  3:53 PM   Specimen: Nasopharyngeal Swab  Result Value Ref Range Status   SARS Coronavirus 2 NEGATIVE NEGATIVE Final    Comment: (NOTE) SARS-CoV-2 target nucleic acids are NOT DETECTED. The SARS-CoV-2 RNA is generally detectable in upper and lower respiratory specimens during the acute phase of infection. Negative results do not preclude SARS-CoV-2 infection, do not rule out co-infections with other pathogens, and should not be used as the sole basis for treatment or other patient management decisions. Negative results must be combined with clinical observations, patient history, and epidemiological information. The expected result is Negative. Fact Sheet for Patients: SugarRoll.be Fact Sheet for Healthcare Providers: https://www.woods-mathews.com/ This test is not yet approved or cleared by the [place] FDA and  has been authorized for detection and/or diagnosis of SARS-CoV-2 by FDA under an Emergency Use Authorization (EUA). This EUA will remain  in effect (meaning this test can be used) for the duration of the COVID-19 declaration under Section  56 4(b)(1) of the Act, 21 U.S.C. section 360bbb-3(b)(1), unless the authorization is terminated or revoked sooner. Performed at [facility], [place] [address] [place], [place] [address]          Radiology Studies: No results found.      Scheduled Meds: . sodium chloride   Intravenous Once  . carvedilol  3.125 mg Oral BID WC  . enoxaparin (LOVENOX) injection  40 mg Subcutaneous Q24H  . feeding supplement (ENSURE ENLIVE)  237 mL Oral TID BM  . furosemide  80 mg Intravenous BID  . losartan  25 mg Oral Daily  . multivitamin with minerals  1 tablet Oral Daily  . sodium chloride flush  3 mL Intravenous Q12H  . spironolactone  25 mg Oral Daily   Continuous Infusions: . sodium chloride Stopped (05/20/19 0542)     LOS: 7 days    Time spent: 25 minutes    Ezekiel Slocumb, DO [facility]   If 7PM-7AM, please contact night-coverage [email or web address] 05/26/2019, 2:10 PM

## 2019-05-26 NOTE — TOC Initial Note (Signed)
Transition of Care [facility]) - Initial/Assessment Note    Patient Details  Name: Thomas [name] MRN: [id number] Date of Birth: 1934-04-20  Transition of Care [facility]) CM/SW Contact:    Eileen Stanford, LCSW Phone Number: 05/26/2019, 9:21 AM  Clinical Narrative:  Pt is alert and oriented. CSW used sign language interpreter in order to assess pt. Interpreters name was Lorrie. Pt states he is agreeable to SNF. Pt states he lives in [place] near [place] and would like to be placed near there. Pt states he sees Dr. Wallene Huh. Pt states family transports him to appointments. Pt states he can afford all of his medications and [facility]. CSW will start bed search.                 Expected Discharge Plan: Skilled Nursing Facility Barriers to Discharge: Continued Medical Work up   Patient Goals and CMS Choice Patient states their goals for this hospitalization and ongoing recovery are:: to get stronger   Choice offered to / list presented to : Patient  Expected Discharge Plan and Services Expected Discharge Plan: [place] In-house Referral: Clinical Social Work   Post Acute Care Choice: [place] Living arrangements for the past 2 months: [place]                                      Prior Living Arrangements/Services Living arrangements for the past 2 months: Single Family Home Lives with:: Spouse Patient language and need for interpreter reviewed:: Yes Do you feel safe going back to the place where you live?: Yes      Need for Family Participation in Patient Care: Yes (Comment) Care giver support system in place?: Yes (comment)   Criminal Activity/Legal Involvement Pertinent to Current Situation/Hospitalization: No - Comment as needed  Activities of Daily Living Home Assistive Devices/Equipment: Eyeglasses ADL Screening (condition at time of admission) Patient's cognitive ability adequate to safely complete daily activities?:  Yes Is the patient deaf or have difficulty hearing?: Yes Does the patient have difficulty seeing, even when wearing glasses/contacts?: No Does the patient have difficulty concentrating, remembering, or making decisions?: No Patient able to express need for assistance with ADLs?: Yes Does the patient have difficulty dressing or bathing?: Yes Independently performs ADLs?: No Communication: Independent with device (comment)(can read and write) Dressing (OT): Needs assistance Feeding: Independent Bathing: Needs assistance In/Out Bed: Needs assistance Walks in Home: Independent Does the patient have difficulty walking or climbing stairs?: Yes Weakness of Legs: Both Weakness of Arms/Hands: Both  Permission Sought/Granted Permission sought to share information with : Family Supports    Share Information with NAME: [name]     Permission granted to share info w Relationship: spouse     Emotional Assessment Appearance:: Appears stated age Attitude/Demeanor/Rapport: Engaged Affect (typically observed): Accepting, Appropriate Orientation: : Oriented to  Time, Oriented to Place, Oriented to Self, Oriented to Situation Alcohol / Substance Use: Not Applicable Psych Involvement: No (comment)  Admission diagnosis:  Anasarca [R60.1] Hypoxia [R09.02] AKI (acute kidney injury) ([place]) [N17.9] Patient Active Problem List   Diagnosis Date Noted  . Decompensated hepatic cirrhosis ([place]) 05/22/2019  . Anasarca 05/19/2019  . CRI (chronic renal insufficiency), stage 3 (moderate) 01/30/2019  . Iron deficiency anemia due to chronic blood loss 11/02/2018  . Coronary artery disease of bypass graft of native heart with stable angina pectoris ([place]) 11/02/2018  . Pure  hypercholesterolemia 11/02/2018  . Biventricular automatic implantable cardioverter defibrillator in situ 11/02/2018  . GIB (gastrointestinal bleeding) 09/14/2018  . Hypotension 08/14/2018  . Acute on chronic systolic CHF (congestive heart  failure) ([place]) 08/12/2018  . Acute on chronic combined systolic and diastolic CHF (congestive heart failure) ([place])   . GI bleed 05/26/2018  . Occult GI bleeding 05/25/2018  . Normocytic anemia 05/25/2018  . Elevated troponin 05/25/2018  . Anemia   . Lymphedema 02/28/2018  . Weakness 07/15/2016  . Fatigue 07/15/2016  . CVA (cerebral infarction) 10/31/2015  . Bulbous urethral stricture 09/18/2015  . Bilateral deafness 08/19/2015  . Bilateral cataracts 08/19/2015  . Acid reflux 08/19/2015  . HLD (hyperlipidemia) 08/19/2015  . Essential hypertension 08/19/2015  . Myocardial infarction ([place]) 08/19/2015  . Calculus of kidney 08/19/2015  . Artificial cardiac pacemaker 08/19/2015  . Gastroduodenal ulcer 08/19/2015  . Dupuytren's contracture of foot 08/19/2015  . Malignant neoplasm of prostate ([place]) 08/19/2015  . Microhematuria 08/19/2015  . CAD S/P multiple PCIs 02/22/2015  . Status post coronary artery bypass grafting 02/22/2015  . Benign essential HTN 04/02/2014  . Hematochezia 02/20/2014  . Tricuspid valve regurgitation, nonrheumatic 02/20/2014  . Mobitz type II atrioventricular block 12/12/2013  . Long term current use of anticoagulant 10/18/2013  . Cardiomyopathy, ischemic 11/11/2012  . CAD Status post coronary artery bypass grafting: 1995 11/11/2012  . Permanent atrial fibrillation ([place]) 11/11/2012  . Biventricular ICD in place 11/11/2012  . Chronic combined systolic and diastolic heart failure ([place]) 11/11/2012   PCP:  [name], MD Pharmacy[id number] [place], [place] [address] [place] [address] Phone: [phone] Fax: [phone]     Social Determinants of Health (SDOH) Interventions    Readmission Risk Interventions No flowsheet data found.

## 2019-05-26 NOTE — Plan of Care (Signed)
  Problem: Education: Goal: Ability to demonstrate management of disease process will improve Outcome: Progressing Remains edematous, continues to diurese with decreasing daily weights noted.

## 2019-05-26 NOTE — TOC Progression Note (Signed)
Transition of Care [facility]) - Progression Note    Patient Details  Name: Thomas [name] MRN: [id number] Date of Birth: Jul 30, 1934  Transition of Care [facility]) CM/SW [place], LCSW Phone Number: 05/26/2019, 3:34 PM  Clinical Narrative:  Only bed offer at this time is [facility]. CSW spoke with pt and pt's wife at bedside using interpreter Marjorie Smolder [phone]). Pt's wife was asking for the information of the facility. CSW printed it off and provided it at bedside. Pt's wife is stating she needs to be able to be at the facility with pt. CSW states she will ask facility about visitation. Per [facility] there is still no visitation but that she would talk to management. [employer] auth started.     Expected Discharge Plan: [place] Barriers to Discharge: Continued Medical Work up  Expected Discharge Plan and Services Expected Discharge Plan: [place] In-house Referral: Clinical Social Work   Post Acute Care Choice: [place] Living arrangements for the past 2 months: Single Family Home                                       Social Determinants of Health (SDOH) Interventions    Readmission Risk Interventions Readmission Risk Prevention Plan 05/26/2019  Transportation Screening Complete  PCP or Specialist Appt within 3-5 Days Complete  HRI or [place] Complete  Social Work Consult for [place] Planning/Counseling Complete  Palliative Care Screening Not Applicable  Medication Review Press photographer) Complete  Some recent data might be hidden

## 2019-05-27 LAB — CBC
HCT: 26.6 % — ABNORMAL LOW (ref 39.0–52.0)
Hemoglobin: 7.9 g/dL — ABNORMAL LOW (ref 13.0–17.0)
MCH: 23.1 pg — ABNORMAL LOW (ref 26.0–34.0)
MCHC: 29.7 g/dL — ABNORMAL LOW (ref 30.0–36.0)
MCV: 77.8 fL — ABNORMAL LOW (ref 80.0–100.0)
Platelets: 328 10*3/uL (ref 150–400)
RBC: 3.42 MIL/uL — ABNORMAL LOW (ref 4.22–5.81)
RDW: 20.8 % — ABNORMAL HIGH (ref 11.5–15.5)
WBC: 6.6 10*3/uL (ref 4.0–10.5)
nRBC: 0 % (ref 0.0–0.2)

## 2019-05-27 LAB — BASIC METABOLIC PANEL
Anion gap: 8 (ref 5–15)
BUN: 27 mg/dL — ABNORMAL HIGH (ref 8–23)
CO2: 27 mmol/L (ref 22–32)
Calcium: 7.8 mg/dL — ABNORMAL LOW (ref 8.9–10.3)
Chloride: 98 mmol/L (ref 98–111)
Creatinine, Ser: 1.38 mg/dL — ABNORMAL HIGH (ref 0.61–1.24)
GFR calc Af Amer: 55 mL/min — ABNORMAL LOW (ref >60)
GFR calc non Af Amer: 47 mL/min — ABNORMAL LOW (ref >60)
Glucose, Bld: 78 mg/dL (ref 70–99)
Potassium: 4.2 mmol/L (ref 3.5–5.1)
Sodium: 133 mmol/L — ABNORMAL LOW (ref 135–145)

## 2019-05-27 LAB — MAGNESIUM: Magnesium: 2.5 mg/dL — ABNORMAL HIGH (ref 1.7–2.4)

## 2019-05-27 NOTE — Progress Notes (Addendum)
Progress Note  Patient Name: Thomas [name] Date of Encounter: 05/27/2019  Primary Cardiologist: Sanda Klein, MD   Subjective   No acute events over the past 24 hours.  Lower extremity edema seems to be improving.  Patient is net -2.2 L over the past 24 hours.  Creatinine is stable over the past 24 hours.  Inpatient Medications    Scheduled Meds: . sodium chloride   Intravenous Once  . carvedilol  3.125 mg Oral BID WC  . enoxaparin (LOVENOX) injection  40 mg Subcutaneous Q24H  . feeding supplement (ENSURE ENLIVE)  237 mL Oral TID BM  . furosemide  80 mg Intravenous BID  . losartan  25 mg Oral Daily  . multivitamin with minerals  1 tablet Oral Daily  . sodium chloride flush  3 mL Intravenous Q12H  . spironolactone  25 mg Oral Daily   Continuous Infusions: . sodium chloride Stopped (05/20/19 0542)   PRN Meds: sodium chloride, acetaminophen **OR** acetaminophen, bisacodyl, oxyCODONE, polyethylene glycol, sodium chloride flush   Vital Signs    Vitals:   05/26/19 1938 05/27/19 0534 05/27/19 0745 05/27/19 1134  BP: (!) 95/57 (!) 100/54 (!) 101/57 (!) 90/53  Pulse: (!) 59 62 62 (!) 59  Resp: 19 20 17 16   Temp: 98.2 F (36.8 C) 97.7 F (36.5 C) 97.9 F (36.6 C) 97.8 F (36.6 C)  TempSrc: Oral Oral Oral   SpO2: 94% 95% 93% 95%  Weight:  91.2 kg    Height:        Intake/Output Summary (Last 24 hours) at 05/27/2019 1141 Last data filed at 05/27/2019 1133 Gross per 24 hour  Intake 240 ml  Output 2450 ml  Net -2210 ml   Last 3 Weights 05/27/2019 05/26/2019 05/25/2019  Weight (lbs) 201 lb 207 lb 9.6 oz 215 lb 3.2 oz  Weight (kg) 91.173 kg 94.167 kg 97.614 kg      Telemetry    Paced rhythm- Personally Reviewed  ECG    No new tracing observed- Personally Reviewed  Physical Exam   GEN: No acute distress.   Neck: + JVD Cardiac:  Regular rhythm, systolic murmur, Respiratory:  Decreased breath sounds at bases GI: Soft, nontender, non-distended  MS: 2+ edema; No  deformity. Neuro:  Nonfocal  Psych: Normal affect   Labs    High Sensitivity Troponin:   Recent Labs  Lab 05/19/19 1553 05/19/19 2047 05/20/19 0054 05/20/19 0637 05/20/19 1321  TROPONINIHS 332* 390* 397* 411* 339*      Chemistry Recent Labs  Lab 05/21/19 0426 05/22/19 0410 05/24/19 0413 05/24/19 0413 05/25/19 0348 05/26/19 0504 05/27/19 0447  NA 141   < > 142   < > 140 138 133*  K 3.9   < > 3.8   < > 4.0 4.3 4.2  CL 103   < > 105   < > 103 102 98  CO2 26   < > 31   < > 31 29 27   GLUCOSE 135*   < > 121*   < > 98 93 78  BUN 61*   < > 35*   < > 28* 25* 27*  CREATININE 1.96*   < > 1.23   < > 1.21 1.36* 1.38*  CALCIUM 7.8*   < > 7.6*   < > 7.6* 7.7* 7.8*  PROT 4.5*  --  4.8*  --   --   --   --   ALBUMIN 1.7*  --  1.7*  --   --   --   --  AST 65*  --  43*  --   --   --   --   ALT 23  --  27  --   --   --   --   ALKPHOS 82  --  117  --   --   --   --   BILITOT 0.6  --  0.7  --   --   --   --   GFRNONAA 31*   < > 54*   < > 55* 48* 47*  GFRAA 36*   < > >60   < > >60 56* 55*  ANIONGAP 12   < > 6   < > 6 7 8    < > = values in this interval not displayed.     Hematology Recent Labs  Lab 05/25/19 0348 05/26/19 0504 05/27/19 0447  WBC 6.5 7.2 6.6  RBC 3.20* 3.54*  3.60* 3.42*  HGB 7.2* 8.1* 7.9*  HCT 24.6* 27.3* 26.6*  MCV 76.9* 77.1* 77.8*  MCH 22.5* 22.9* 23.1*  MCHC 29.3* 29.7* 29.7*  RDW 20.4* 20.4* 20.8*  PLT 271 289 328    BNPNo results for input(s): BNP, PROBNP in the last 168 hours.   DDimer No results for input(s): DDIMER in the last 168 hours.   Radiology    US Venous Img Lower Bilateral (DVT)  Result Date: 05/26/2019 CLINICAL DATA:  Bilateral lower extremity edema for the past week. History of malignancy. Evaluate for DVT. EXAM: BILATERAL LOWER EXTREMITY VENOUS DOPPLER ULTRASOUND TECHNIQUE: Gray-scale sonography with graded compression, as well as color Doppler and duplex ultrasound were performed to evaluate the lower extremity deep venous  systems from the level of the common femoral vein and including the common femoral, femoral, profunda femoral, popliteal and calf veins including the posterior tibial, peroneal and gastrocnemius veins when visible. The superficial great saphenous vein was also interrogated. Spectral Doppler was utilized to evaluate flow at rest and with distal augmentation maneuvers in the common femoral, femoral and popliteal veins. COMPARISON:  Right lower extremity venous Doppler ultrasound-02/25/2026 (negative). FINDINGS: RIGHT LOWER EXTREMITY Common Femoral Vein: No evidence of thrombus. Normal compressibility, respiratory phasicity and response to augmentation. Saphenofemoral Junction: No evidence of thrombus. Normal compressibility and flow on color Doppler imaging. Profunda Femoral Vein: No evidence of thrombus. Normal compressibility and flow on color Doppler imaging. Femoral Vein: No evidence of thrombus. Normal compressibility, respiratory phasicity and response to augmentation. Popliteal Vein: No evidence of thrombus. Normal compressibility, respiratory phasicity and response to augmentation. Calf Veins: No evidence of thrombus. Normal compressibility and flow on color Doppler imaging. Superficial Great Saphenous Vein: No evidence of thrombus. Normal compressibility. Venous Reflux:  None. Other Findings:  None. LEFT LOWER EXTREMITY Common Femoral Vein: No evidence of thrombus. Normal compressibility, respiratory phasicity and response to augmentation. Saphenofemoral Junction: No evidence of thrombus. Normal compressibility and flow on color Doppler imaging. Profunda Femoral Vein: No evidence of thrombus. Normal compressibility and flow on color Doppler imaging. Femoral Vein: No evidence of thrombus. Normal compressibility, respiratory phasicity and response to augmentation. Popliteal Vein: No evidence of thrombus. Normal compressibility, respiratory phasicity and response to augmentation. Calf Veins: No evidence of  thrombus. Normal compressibility and flow on color Doppler imaging. Superficial Great Saphenous Vein: No evidence of thrombus. Normal compressibility. Venous Reflux:  None. Other Findings:  None. IMPRESSION: No evidence of DVT within either lower extremity. Electronically Signed   By: Sandi Mariscal M.D.   On: 05/26/2019 17:08    Cardiac Studies  Echocardiogram 05/21/19 . Left ventricular ejection fraction, by estimation, is 35 to 40%. The  left ventricle has moderately decreased function. The left ventricle  demonstrates global hypokinesis. The left ventricular internal cavity size  was moderately to severely dilated.  There is mild left ventricular hypertrophy. Left ventricular diastolic  parameters are indeterminate. There is akinesis of the left ventricular,  basal inferior segment.  2. Right ventricular systolic function is mildly reduced. The right  ventricular size is mildly enlarged. There is moderately elevated  pulmonary artery systolic pressure.  3. Left atrial size was severely dilated.  4. Right atrial size was moderately dilated.  5. Moderate pleural effusion in the left lateral region.  6. The mitral valve is degenerative. Mild to moderate mitral valve  regurgitation.  7. Tricuspid valve regurgitation is severe.  8. The aortic valve has an indeterminant number of cusps. Aortic valve  regurgitation is mild. Mild aortic valve sclerosis is present, with no  evidence of aortic valve stenosis.  9. Aortic dilatation noted. There is mild dilatation of the aortic root  and of the ascending aorta measuring 43 mm.  10. The inferior vena cava is dilated in size with <50% respiratory  variability, suggesting right atrial pressure of 15 mmHg.  Patient Profile     85 y.o. male 85 year old gentleman with history of CAD status post CABG, PCI, heart failure reduced ejection fraction last EF 35 to 40%, status post ICD 2014, permanent A. fib not on anticoagulation due to GI bleed,  deafness who is being seen for volume overload  Assessment & Plan    1. Edema, HFrEF EF 35-40% -Still with 2+ edema in the lower extremity -Net -2.2 L over the past 24 hours -Creatinine looks better today.  Continue IV Lasix 80 twice daily. -We may have to decrease dose tomorrow if creatinine starts to worsen. -Blood pressure lipid systolic in the 0000000.  We will stop losartan and Aldactone to give more room for diuresing. -Continue Coreg,   2.  History of CAD status post CABG, PCI -Currently no chest pain -Continue Coreg and statin.  History of GI bleed prevents anticoagulant usage.  3.  Permanent A. Fib -Heart rate controlled.  Currently paced rhythm. -Not on anticoagulation due to history of GI bleeds, known AVM.[name]      Signed, Kate Sable, MD  05/27/2019, 11:41 AM

## 2019-05-27 NOTE — Progress Notes (Signed)
PROGRESS NOTE    Thomas [name]  [id number] DOB: 1934-05-20 DOA: 05/19/2019  PCP: [name], MD    LOS - 8   Brief Narrative:  85 year old deaf male requiring ASL interpretation with history of combined systolic/diastolic heart failure, atrial flutter status post biventricular pacemaker, CAD, HTN was admitted 05/19/2019 with anasarca, cough and shortness of breath. Covid is negative. BNP is elevated 638. Work-up has revealed a cirrhotic liver. He is getting diuresed with daily weights and I's and O's.Cardiology consulting.  Subjective 3/13: No acute events reported.  Patient without acute complaints of fever/chills, CP, SOB or other symptoms.  Leg swelling a little better.    Assessment & Plan:   Principal Problem:   Anasarca Active Problems:   CAD Status post coronary artery bypass grafting: 1995   Permanent atrial fibrillation (HCC)   Chronic combined systolic and diastolic heart failure (HCC)   Benign essential HTN   CAD S/P multiple PCIs   Elevated troponin   Acute on chronic systolic CHF (congestive heart failure) (HCC)   CRI (chronic renal insufficiency), stage 3 (moderate)   Decompensated hepatic cirrhosis (HCC)   Leg swelling   Acute decompensation ofcombined systolic/diastolicCHF Anasarca Starling forces apparently improved with diuresis, creatininecontinues to improve.Has moderate pleural effusions bilaterally, will defer thoracentesis for now and continue diuresis.Net fluid balance -9.5 L to date. Lower extremity duplex U/S ruled out DVT bilaterally. --Cardiology following --continue Lasix 80 mg IV BID --continue Coreg --spironolactone & losartan - stopped today due to soft BP's to give BP room to tolerate diuresis --daily weights & strick I/O's --monitor renal function closely, risk for cardiorenal syndrome with aggressive diuresis  Cirrhosis Patient with early cirrhosis on ultrasound, without ascites seen. --continue diuresis with  Lasix  --hold Aldactone due to soft BP's with diuresis --monitor potassium, replace as needed for K>4.0  Acute on Chronic Microcytic Anemia - baseline Hbg appears to be 7.9-8.6, now in low 7's. Has known GI AVM's, so likely some chronic blood loss. Patient with SOB worse than baseline recently, and persistent despite diuresis, concern for symptomatic anemia contributing.  Transfused 1 unit pRBC's on 3/11. --monitor CBC --transfuse if Hbg < 7.0  "Black tarry stools" per nursing report- resolved History of GI bleed Patient hemodynamically stable,H&H stable, butdoes have apriorhistory of GI bleed. --monitor H&H --consider GI consult if further drop in Hbg or ongoing bleeding --recommend outpatient GI follow up --avoidallNSAIDs and antiplatelets  Acutekidney injury superimposed onCKDstage IIIa Creatinine improved with diuresis given likely improvement in Starling forces.  Will need to avoid hepatorenal syndrome so diuresis must be judicious. --monitor renal function and electrolytes  Liver nodule Discussed liver nodule with interventional radiology, they do not think this is a particularly concerningfinding, possibly over-read. Recommendation is for noncontrasted MRI to be done after patient is adequately diuresed (so he can lie still and flat during MRI) to see if this nodule is in fact a nodule or just part of the heterogenous architecture of his liver.  Cough with recent Covid exposure-improving with diuresis. COVID-19 PCR negative. Chest CT without infiltrates. Procalcitonin was mildly elevated but patient afebrile and without leukocytosis or other clinical signs of infection. Empiric antibiotics started on admission discontinued. --Monitor for signs symptoms of infection --Follow CBC  Elevated troponin-secondary to demand ischemia per cardiology. Troponin peaked at 411 andtrended down. Patient without chest pain.  Atrial fibrillation-with  biventricular pacemaker.Not on anticoagulation presumably due to history of recurrent GI bleeding. Rate controlled without medication. --Monitor  Essential hypertension-currently with borderline soft blood  pressures --Hold antihypertensives    DVT prophylaxis: Lovenox   Code Status: Full Code  Family Communication: none at bedside during [place]   Disposition Plan:  To SNF pending more diuresis and cardiology clearance.  Patient continues to require aggressive IV diuresis at this time as volume status not back to baseline. Coming From home Exp DC Date 3/15 Barriers IV diuresis, hypervolemia Medically Stable for Discharge? no   Consultants:   Cardiology  Antimicrobials:    Rocephin & Azithromycin empirically on admission, discontinued after 1 day   Objective: Vitals:   05/26/19 1708 05/26/19 1938 05/27/19 0534 05/27/19 0745  BP: (!) 101/51 (!) 95/57 (!) 100/54 (!) 101/57  Pulse: 63 (!) 59 62 62  Resp: 18 19 20 17   Temp: 97.8 F (36.6 C) 98.2 F (36.8 C) 97.7 F (36.5 C) 97.9 F (36.6 C)  TempSrc: Oral Oral Oral Oral  SpO2: 94% 94% 95% 93%  Weight:   91.2 kg   Height:        Intake/Output Summary (Last 24 hours) at 05/27/2019 0830 Last data filed at 05/27/2019 0742 Gross per 24 hour  Intake 360 ml  Output 2575 ml  Net -2215 ml   Filed Weights   05/25/19 0400 05/26/19 0324 05/27/19 0534  Weight: 97.6 kg 94.2 kg 91.2 kg    Examination:  General exam: awake, alert, no acute distress HEENT: moist mucus membranes, deaf Respiratory system: CTAB diminished bases, no wheezes, rales or rhonchi, normal respiratory effort. Cardiovascular system: normal S1/S2, RRR Extremities: b/l lower extremity pitting edema slightly better, normal tone, moves all extremities    Data Reviewed: I have personally reviewed following labs and imaging studies  CBC: Recent Labs  Lab 05/21/19 0426 05/22/19 0418 05/23/19 0325 05/24/19 0413 05/25/19 0348 05/26/19 0504  05/27/19 0447  WBC 9.0   < > 6.7 6.5 6.5 7.2 6.6  NEUTROABS 6.9  --   --   --   --   --   --   HGB 7.6*   < > 7.3* 7.4* 7.2* 8.1* 7.9*  HCT 25.6*   < > 24.5* 25.5* 24.6* 27.3* 26.6*  MCV 76.4*   < > 77.0* 77.5* 76.9* 77.1* 77.8*  PLT 227   < > 246 273 271 289 328   < > = values in this interval not displayed.   Basic Metabolic Panel: Recent Labs  Lab 05/23/19 0325 05/24/19 0413 05/25/19 0348 05/26/19 0504 05/27/19 0447  NA 140 142 140 138 133*  K 3.5 3.8 4.0 4.3 4.2  CL 105 105 103 102 98  CO2 28 31 31 29 27   GLUCOSE 107* 121* 98 93 78  BUN 44* 35* 28* 25* 27*  CREATININE 1.37* 1.23 1.21 1.36* 1.38*  CALCIUM 7.6* 7.6* 7.6* 7.7* 7.8*  MG  --   --  2.2 2.3 2.5*   GFR: Estimated Creatinine Clearance: 48 mL/min (A) (by C-G formula based on SCr of 1.38 mg/dL (H)). Liver Function Tests: Recent Labs  Lab 05/21/19 0426 05/24/19 0413  AST 65* 43*  ALT 23 27  ALKPHOS 82 117  BILITOT 0.6 0.7  PROT 4.5* 4.8*  ALBUMIN 1.7* 1.7*   No results for input(s): LIPASE, AMYLASE in the last 168 hours. No results for input(s): AMMONIA in the last 168 hours. Coagulation Profile: Recent Labs  Lab 05/21/19 0426  INR 1.4*   Cardiac Enzymes: No results for input(s): CKTOTAL, CKMB, CKMBINDEX, TROPONINI in the last 168 hours. BNP (last 3 results) No results for input(s): PROBNP in  the last 8760 hours. HbA1C: No results for input(s): HGBA1C in the last 72 hours. CBG: No results for input(s): GLUCAP in the last 168 hours. Lipid Profile: No results for input(s): CHOL, HDL, LDLCALC, TRIG, CHOLHDL, LDLDIRECT in the last 72 hours. Thyroid Function Tests: No results for input(s): TSH, T4TOTAL, FREET4, T3FREE, THYROIDAB in the last 72 hours. Anemia Panel: Recent Labs    05/26/19 0504  VITAMINB12 1,027*  FOLATE 8.8  FERRITIN 47  TIBC 214*  IRON 29*  RETICCTPCT 1.7   Sepsis Labs: No results for input(s): PROCALCITON, LATICACIDVEN in the last 168 hours.  Recent Results (from the  past 240 hour(s))  SARS CORONAVIRUS 2 (TAT 6-24 HRS) Nasopharyngeal Nasopharyngeal Swab     Status: None   Collection Time: 05/19/19  3:53 PM   Specimen: Nasopharyngeal Swab  Result Value Ref Range Status   SARS Coronavirus 2 NEGATIVE NEGATIVE Final    Comment: (NOTE) SARS-CoV-2 target nucleic acids are NOT DETECTED. The SARS-CoV-2 RNA is generally detectable in upper and lower respiratory specimens during the acute phase of infection. Negative results do not preclude SARS-CoV-2 infection, do not rule out co-infections with other pathogens, and should not be used as the sole basis for treatment or other patient management decisions. Negative results must be combined with clinical observations, patient history, and epidemiological information. The expected result is Negative. Fact Sheet for Patients: SugarRoll.be Fact Sheet for Healthcare Providers: https://www.woods-mathews.com/ This test is not yet approved or cleared by the [place] FDA and  has been authorized for detection and/or diagnosis of SARS-CoV-2 by FDA under an Emergency Use Authorization (EUA). This EUA will remain  in effect (meaning this test can be used) for the duration of the COVID-19 declaration under Section 56 4(b)(1) of the Act, 21 U.S.C. section 360bbb-3(b)(1), unless the authorization is terminated or revoked sooner. Performed at [facility], [place] [address] [place], [place] [address]          Radiology Studies: US Venous Img Lower Bilateral (DVT)  Result Date: 05/26/2019 CLINICAL DATA:  Bilateral lower extremity edema for the past week. History of malignancy. Evaluate for DVT. EXAM: BILATERAL LOWER EXTREMITY VENOUS DOPPLER ULTRASOUND TECHNIQUE: Gray-scale sonography with graded compression, as well as color Doppler and duplex ultrasound were performed to evaluate the lower extremity deep venous systems from the level of the common femoral vein and  including the common femoral, femoral, profunda femoral, popliteal and calf veins including the posterior tibial, peroneal and gastrocnemius veins when visible. The superficial great saphenous vein was also interrogated. Spectral Doppler was utilized to evaluate flow at rest and with distal augmentation maneuvers in the common femoral, femoral and popliteal veins. COMPARISON:  Right lower extremity venous Doppler ultrasound-02/25/2026 (negative). FINDINGS: RIGHT LOWER EXTREMITY Common Femoral Vein: No evidence of thrombus. Normal compressibility, respiratory phasicity and response to augmentation. Saphenofemoral Junction: No evidence of thrombus. Normal compressibility and flow on color Doppler imaging. Profunda Femoral Vein: No evidence of thrombus. Normal compressibility and flow on color Doppler imaging. Femoral Vein: No evidence of thrombus. Normal compressibility, respiratory phasicity and response to augmentation. Popliteal Vein: No evidence of thrombus. Normal compressibility, respiratory phasicity and response to augmentation. Calf Veins: No evidence of thrombus. Normal compressibility and flow on color Doppler imaging. Superficial Great Saphenous Vein: No evidence of thrombus. Normal compressibility. Venous Reflux:  None. Other Findings:  None. LEFT LOWER EXTREMITY Common Femoral Vein: No evidence of thrombus. Normal compressibility, respiratory phasicity and response to augmentation. Saphenofemoral Junction: No evidence of thrombus. Normal  compressibility and flow on color Doppler imaging. Profunda Femoral Vein: No evidence of thrombus. Normal compressibility and flow on color Doppler imaging. Femoral Vein: No evidence of thrombus. Normal compressibility, respiratory phasicity and response to augmentation. Popliteal Vein: No evidence of thrombus. Normal compressibility, respiratory phasicity and response to augmentation. Calf Veins: No evidence of thrombus. Normal compressibility and flow on color Doppler  imaging. Superficial Great Saphenous Vein: No evidence of thrombus. Normal compressibility. Venous Reflux:  None. Other Findings:  None. IMPRESSION: No evidence of DVT within either lower extremity. Electronically Signed   By: Sandi Mariscal M.D.   On: 05/26/2019 17:08        Scheduled Meds: . sodium chloride   Intravenous Once  . carvedilol  3.125 mg Oral BID WC  . enoxaparin (LOVENOX) injection  40 mg Subcutaneous Q24H  . feeding supplement (ENSURE ENLIVE)  237 mL Oral TID BM  . furosemide  80 mg Intravenous BID  . losartan  25 mg Oral Daily  . multivitamin with minerals  1 tablet Oral Daily  . sodium chloride flush  3 mL Intravenous Q12H  . spironolactone  25 mg Oral Daily   Continuous Infusions: . sodium chloride Stopped (05/20/19 0542)     LOS: 8 days    Time spent: 15 minutes    Ezekiel Slocumb, DO [facility]   If 7PM-7AM, please contact night-coverage [email or web address] 05/27/2019, 8:30 AM

## 2019-05-27 NOTE — Progress Notes (Signed)
MD notified via secured chat. Pts BP is 96/54 and HR is in 60s. Orders to hold this dose of lasix IV and coreg. I will continue to assess.

## 2019-05-28 LAB — BASIC METABOLIC PANEL
Anion gap: 6 (ref 5–15)
BUN: 26 mg/dL — ABNORMAL HIGH (ref 8–23)
CO2: 29 mmol/L (ref 22–32)
Calcium: 8 mg/dL — ABNORMAL LOW (ref 8.9–10.3)
Chloride: 102 mmol/L (ref 98–111)
Creatinine, Ser: 1.55 mg/dL — ABNORMAL HIGH (ref 0.61–1.24)
GFR calc Af Amer: 48 mL/min — ABNORMAL LOW (ref >60)
GFR calc non Af Amer: 41 mL/min — ABNORMAL LOW (ref >60)
Glucose, Bld: 84 mg/dL (ref 70–99)
Potassium: 4.3 mmol/L (ref 3.5–5.1)
Sodium: 137 mmol/L (ref 135–145)

## 2019-05-28 LAB — CBC
HCT: 26.8 % — ABNORMAL LOW (ref 39.0–52.0)
Hemoglobin: 8 g/dL — ABNORMAL LOW (ref 13.0–17.0)
MCH: 23 pg — ABNORMAL LOW (ref 26.0–34.0)
MCHC: 29.9 g/dL — ABNORMAL LOW (ref 30.0–36.0)
MCV: 77 fL — ABNORMAL LOW (ref 80.0–100.0)
Platelets: 323 10*3/uL (ref 150–400)
RBC: 3.48 MIL/uL — ABNORMAL LOW (ref 4.22–5.81)
RDW: 21.2 % — ABNORMAL HIGH (ref 11.5–15.5)
WBC: 6.4 10*3/uL (ref 4.0–10.5)
nRBC: 0 % (ref 0.0–0.2)

## 2019-05-28 LAB — MAGNESIUM: Magnesium: 2.6 mg/dL — ABNORMAL HIGH (ref 1.7–2.4)

## 2019-05-28 MED ORDER — FUROSEMIDE 10 MG/ML IJ SOLN
40.0000 mg | Freq: Two times a day (BID) | INTRAMUSCULAR | Status: DC
  Administered 2019-05-29: 40 mg via INTRAVENOUS
  Filled 2019-05-28: qty 4

## 2019-05-28 NOTE — Progress Notes (Signed)
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 Covid is negative. BNP is elevated 638. Work-up has revealed a cirrhotic liver. He is getting diuresed with daily weights and I's and O's.Cardiology consulting.  Subjective 3/14: Patient seen and examined at bedside this morning. No acute is reported overnight. He reports his leg swelling is much better. Denies fevers or chills, chest pain or shortness of breath, nausea vomiting diarrhea or other acute complaints.  Assessment & Plan:   Principal Problem:   Anasarca Active Problems:   CAD Status post coronary artery bypass grafting: 1995   Permanent atrial fibrillation (HCC)   Chronic combined systolic and diastolic heart failure (HCC)   Benign essential HTN   CAD S/P multiple PCIs   Elevated troponin   Acute on chronic systolic CHF (congestive heart failure) (HCC)   CRI (chronic renal insufficiency), stage 3 (moderate)   Decompensated hepatic cirrhosis (HCC)   Leg swelling   Acute decompensation ofcombined systolic/diastolicCHF Anasarca Starling forces apparently improved with diuresis, creatininecontinues to improve.Has moderate pleural effusions bilaterally, will defer thoracentesis for now and continue diuresis.Net fluid balance -13 L to date (unclear if inputs are accurate). Lower extremity edema greatly improved. Lower extremity duplex U/S ruled out DVT bilaterally. --Cardiology following --Decrease Lasix to 40 mg IV BID, given creatinine slightly worse --continue Coreg --spironolactone & losartan held due to borderline blood pressures --daily weights & strick I/O's --monitor renal  function closely, risk for cardiorenal syndrome with aggressive diuresis  Cirrhosis Patient with early cirrhosis on ultrasound, without ascites seen. --continue diuresis with Lasix  --hold Aldactone due to soft BP's with diuresis --monitor potassium, replace as needed for K>4.0  Acute on Chronic Microcytic Anemia - baseline Hbg appears to be 7.9-8.6, now in low 7's. Has known GI AVM's, so likely some chronic blood loss. Patient with SOB worse than baseline recently, and persistent despite diuresis, concern for symptomatic anemia contributing.  Transfused 1 unit pRBC's on 3/11. --monitor CBC --transfuse if Hbg < 7.0  "Black tarry stools" per nursing report- resolved History of GI bleed Patient hemodynamically stable,H&H stable, butdoes have apriorhistory of GI bleed. --monitor H&H --consider GI consult if further drop in Hbg or ongoing bleeding --recommend outpatient GI follow up --avoidallNSAIDs and antiplatelets  Acutekidney injury superimposed onCKDstage IIIa Creatinine improved with diuresis given likely improvement in Starling forces.  Will need to avoid hepatorenal syndrome so diuresis must be judicious. --monitor renal function and electrolytes  Liver nodule Discussed liver nodule with interventional radiology, they do not think this is a particularly concerningfinding, possibly over-read. Recommendation is for noncontrasted MRI to be done after patient is adequately diuresed (so he can lie still and flat during MRI) to see if this nodule is in fact a nodule or just part of the heterogenous architecture of his liver.  Cough with recent Covid exposure-improving with diuresis. COVID-19 PCR negative. Chest CT without infiltrates. Procalcitonin was mildly elevated but patient afebrile and without leukocytosis or other clinical signs of infection. Empiric antibiotics started on admission discontinued. --Monitor for signs symptoms of infection --Follow  CBC  Elevated troponin-secondary to demand ischemia per cardiology. Troponin peaked at 411 andtrended down. Patient without chest pain.  Atrial fibrillation-with biventricular pacemaker.Not on  anticoagulation presumably due to history of recurrent GI bleeding. Rate controlled without medication. --Monitor  Essential hypertension-currently with borderline soft blood pressures --Hold antihypertensives    DVT prophylaxis: Lovenox   Code Status: Full Code  Family Communication: none at bedside during [place]   Disposition Plan:  To SNF pending more diuresis and cardiology clearance.  Patient continues to require aggressive IV diuresis at this time as volume status not back to baseline. Coming From home Exp DC Date 3/15 Barriers IV diuresis, hypervolemia Medically Stable for Discharge? no   Consultants:   Cardiology  Antimicrobials:    Rocephin & Azithromycin empirically on admission, discontinued after 1 day  Objective: Vitals:   05/28/19 0634 05/28/19 0749 05/28/19 0759 05/28/19 1128  BP:  (!) 102/55  (!) 105/52  Pulse:  63 72 60  Resp:      Temp:  97.8 F (36.6 C)  98.4 F (36.9 C)  TempSrc:  Oral  Oral  SpO2:  95%  94%  Weight: 91.2 kg     Height:        Intake/Output Summary (Last 24 hours) at 05/28/2019 1302 Last data filed at 05/28/2019 1127 Gross per 24 hour  Intake 720 ml  Output 2050 ml  Net -1330 ml   Filed Weights   05/26/19 0324 05/27/19 0534 05/28/19 0634  Weight: 94.2 kg 91.2 kg 91.2 kg    Examination:  General exam: awake, alert, no acute distress HEENT: moist mucus membranes, deaf Respiratory system: CTAB, no wheezes, rales or rhonchi, normal respiratory effort. Cardiovascular system: normal S1/S2, RRR, lower extremity edema much better.   Extremities: moves all, normal tone, no cyanosis, lower extremity venous stasis changes left worse than right Skin: Dry, intact, normal temperature and color Psychiatry: Normal mood,  congruent affect, insight and judgment appear normal   Data Reviewed: I have personally reviewed following labs and imaging studies  CBC: Recent Labs  Lab 05/24/19 0413 05/25/19 0348 05/26/19 0504 05/27/19 0447 05/28/19 0520  WBC 6.5 6.5 7.2 6.6 6.4  HGB 7.4* 7.2* 8.1* 7.9* 8.0*  HCT 25.5* 24.6* 27.3* 26.6* 26.8*  MCV 77.5* 76.9* 77.1* 77.8* 77.0*  PLT 273 271 289 328 [id number]   Basic Metabolic Panel: Recent Labs  Lab 05/24/19 0413 05/25/19 0348 05/26/19 0504 05/27/19 0447 05/28/19 0520  NA 142 140 138 133* 137  K 3.8 4.0 4.3 4.2 4.3  CL 105 103 102 98 102  CO2 31 31 29 27 29   GLUCOSE 121* 98 93 78 84  BUN 35* 28* 25* 27* 26*  CREATININE 1.23 1.21 1.36* 1.38* 1.55*  CALCIUM 7.6* 7.6* 7.7* 7.8* 8.0*  MG  --  2.2 2.3 2.5* 2.6*   GFR: Estimated Creatinine Clearance: 42.7 mL/min (A) (by C-G formula based on SCr of 1.55 mg/dL (H)). Liver Function Tests: Recent Labs  Lab 05/24/19 0413  AST 43*  ALT 27  ALKPHOS 117  BILITOT 0.7  PROT 4.8*  ALBUMIN 1.7*   No results for input(s): LIPASE, AMYLASE in the last 168 hours. No results for input(s): AMMONIA in the last 168 hours. Coagulation Profile: No results for input(s): INR, PROTIME in the last 168 hours. Cardiac Enzymes: No results for input(s): CKTOTAL, CKMB, CKMBINDEX, TROPONINI in the last 168 hours. BNP (last 3 results) No results for input(s): PROBNP in the last 8760 hours. HbA1C: No results for input(s): HGBA1C in the last 72 hours. CBG: No results for input(s): GLUCAP in the last 168 hours. Lipid Profile: No results for input(s): CHOL, HDL, LDLCALC, TRIG,  CHOLHDL, LDLDIRECT in the last 72 hours. Thyroid Function Tests: No results for input(s): TSH, T4TOTAL, FREET4, T3FREE, THYROIDAB in the last 72 hours. Anemia Panel: Recent Labs    05/26/19 0504  VITAMINB12 1,027*  FOLATE 8.8  FERRITIN 47  TIBC 214*  IRON 29*  RETICCTPCT 1.7   Sepsis Labs: No results for input(s): PROCALCITON, LATICACIDVEN in  the last 168 hours.  Recent Results (from the past 240 hour(s))  SARS CORONAVIRUS 2 (TAT 6-24 HRS) Nasopharyngeal Nasopharyngeal Swab     Status: None   Collection Time: 05/19/19  3:53 PM   Specimen: Nasopharyngeal Swab  Result Value Ref Range Status   SARS Coronavirus 2 NEGATIVE NEGATIVE Final    Comment: (NOTE) SARS-CoV-2 target nucleic acids are NOT DETECTED. The SARS-CoV-2 RNA is generally detectable in upper and lower respiratory specimens during the acute phase of infection. Negative results do not preclude SARS-CoV-2 infection, do not rule out co-infections with other pathogens, and should not be used as the sole basis for treatment or other patient management decisions. Negative results must be combined with clinical observations, patient history, and epidemiological information. The expected result is Negative. Fact Sheet for Patients: SugarRoll.be Fact Sheet for Healthcare Providers: https://www.woods-mathews.com/ This test is not yet approved or cleared by the [place] FDA and  has been authorized for detection and/or diagnosis of SARS-CoV-2 by FDA under an Emergency Use Authorization (EUA). This EUA will remain  in effect (meaning this test can be used) for the duration of the COVID-19 declaration under Section 56 4(b)(1) of the Act, 21 U.S.C. section 360bbb-3(b)(1), unless the authorization is terminated or revoked sooner. Performed at [facility], [place] [address] [place], [place] [address]          Radiology Studies: US Venous Img Lower Bilateral (DVT)  Result Date: 05/26/2019 CLINICAL DATA:  Bilateral lower extremity edema for the past week. History of malignancy. Evaluate for DVT. EXAM: BILATERAL LOWER EXTREMITY VENOUS DOPPLER ULTRASOUND TECHNIQUE: Gray-scale sonography with graded compression, as well as color Doppler and duplex ultrasound were performed to evaluate the lower extremity deep venous systems  from the level of the common femoral vein and including the common femoral, femoral, profunda femoral, popliteal and calf veins including the posterior tibial, peroneal and gastrocnemius veins when visible. The superficial great saphenous vein was also interrogated. Spectral Doppler was utilized to evaluate flow at rest and with distal augmentation maneuvers in the common femoral, femoral and popliteal veins. COMPARISON:  Right lower extremity venous Doppler ultrasound-02/25/2026 (negative). FINDINGS: RIGHT LOWER EXTREMITY Common Femoral Vein: No evidence of thrombus. Normal compressibility, respiratory phasicity and response to augmentation. Saphenofemoral Junction: No evidence of thrombus. Normal compressibility and flow on color Doppler imaging. Profunda Femoral Vein: No evidence of thrombus. Normal compressibility and flow on color Doppler imaging. Femoral Vein: No evidence of thrombus. Normal compressibility, respiratory phasicity and response to augmentation. Popliteal Vein: No evidence of thrombus. Normal compressibility, respiratory phasicity and response to augmentation. Calf Veins: No evidence of thrombus. Normal compressibility and flow on color Doppler imaging. Superficial Great Saphenous Vein: No evidence of thrombus. Normal compressibility. Venous Reflux:  None. Other Findings:  None. LEFT LOWER EXTREMITY Common Femoral Vein: No evidence of thrombus. Normal compressibility, respiratory phasicity and response to augmentation. Saphenofemoral Junction: No evidence of thrombus. Normal compressibility and flow on color Doppler imaging. Profunda Femoral Vein: No evidence of thrombus. Normal compressibility and flow on color Doppler imaging. Femoral Vein: No evidence of thrombus. Normal compressibility, respiratory phasicity and response to augmentation.  Popliteal Vein: No evidence of thrombus. Normal compressibility, respiratory phasicity and response to augmentation. Calf Veins: No evidence of thrombus.  Normal compressibility and flow on color Doppler imaging. Superficial Great Saphenous Vein: No evidence of thrombus. Normal compressibility. Venous Reflux:  None. Other Findings:  None. IMPRESSION: No evidence of DVT within either lower extremity. Electronically Signed   By: Sandi Mariscal M.D.   On: 05/26/2019 17:08        Scheduled Meds: . sodium chloride   Intravenous Once  . carvedilol  3.125 mg Oral BID WC  . enoxaparin (LOVENOX) injection  40 mg Subcutaneous Q24H  . feeding supplement (ENSURE ENLIVE)  237 mL Oral TID BM  . furosemide  40 mg Intravenous BID  . multivitamin with minerals  1 tablet Oral Daily  . sodium chloride flush  3 mL Intravenous Q12H   Continuous Infusions: . sodium chloride Stopped (05/20/19 0542)     LOS: 9 days    Time spent: 30 minutes    Ezekiel Slocumb, DO [facility]   If 7PM-7AM, please contact night-coverage [email or web address] 05/28/2019, 1:02 PM

## 2019-05-28 NOTE — Progress Notes (Signed)
Progress Note  Patient Name: Thomas [name] Date of Encounter: 05/28/2019  Primary Cardiologist: Sanda Klein, MD   Subjective   No acute events over the past 24 hours.  Lower extremity edema seems to be improving.  Patient is net -1.2 L over the past 24 hours.  Creatinine has worsened over the past 24 hours.  Inpatient Medications    Scheduled Meds: . sodium chloride   Intravenous Once  . carvedilol  3.125 mg Oral BID WC  . enoxaparin (LOVENOX) injection  40 mg Subcutaneous Q24H  . feeding supplement (ENSURE ENLIVE)  237 mL Oral TID BM  . furosemide  40 mg Intravenous BID  . multivitamin with minerals  1 tablet Oral Daily  . sodium chloride flush  3 mL Intravenous Q12H   Continuous Infusions: . sodium chloride Stopped (05/20/19 0542)   PRN Meds: sodium chloride, acetaminophen **OR** acetaminophen, bisacodyl, oxyCODONE, polyethylene glycol, sodium chloride flush   Vital Signs    Vitals:   05/28/19 0626 05/28/19 0634 05/28/19 0749 05/28/19 0759  BP: 101/60  (!) 102/55   Pulse: 66  63 72  Resp: 20     Temp: 97.9 F (36.6 C)  97.8 F (36.6 C)   TempSrc:   Oral   SpO2: 94%  95%   Weight:  91.2 kg    Height:        Intake/Output Summary (Last 24 hours) at 05/28/2019 1120 Last data filed at 05/28/2019 0950 Gross per 24 hour  Intake 720 ml  Output 1650 ml  Net -930 ml   Last 3 Weights 05/28/2019 05/27/2019 05/26/2019  Weight (lbs) 201 lb 201 lb 207 lb 9.6 oz  Weight (kg) 91.173 kg 91.173 kg 94.167 kg      Telemetry    Paced rhythm- Personally Reviewed  ECG    No new tracing observed- Personally Reviewed  Physical Exam   GEN: No acute distress.   Neck: + JVD Cardiac:  Regular rhythm, systolic murmur, Respiratory:  Decreased breath sounds at bases GI: Soft, nontender, non-distended  MS: 2+ edema; No deformity. Neuro:  Nonfocal  Psych: Normal affect   Labs    High Sensitivity Troponin:   Recent Labs  Lab 05/19/19 1553 05/19/19 2047 05/20/19 0054  05/20/19 0637 05/20/19 1321  TROPONINIHS 332* 390* 397* 411* 339*      Chemistry Recent Labs  Lab 05/24/19 0413 05/25/19 0348 05/26/19 0504 05/27/19 0447 05/28/19 0520  NA 142   < > 138 133* 137  K 3.8   < > 4.3 4.2 4.3  CL 105   < > 102 98 102  CO2 31   < > 29 27 29   GLUCOSE 121*   < > 93 78 84  BUN 35*   < > 25* 27* 26*  CREATININE 1.23   < > 1.36* 1.38* 1.55*  CALCIUM 7.6*   < > 7.7* 7.8* 8.0*  PROT 4.8*  --   --   --   --   ALBUMIN 1.7*  --   --   --   --   AST 43*  --   --   --   --   ALT 27  --   --   --   --   ALKPHOS 117  --   --   --   --   BILITOT 0.7  --   --   --   --   GFRNONAA 54*   < > 48* 47* 41*  GFRAA >60   < >  56* 55* 48*  ANIONGAP 6   < > 7 8 6    < > = values in this interval not displayed.     Hematology Recent Labs  Lab 05/26/19 0504 05/27/19 0447 05/28/19 0520  WBC 7.2 6.6 6.4  RBC 3.54*  3.60* 3.42* 3.48*  HGB 8.1* 7.9* 8.0*  HCT 27.3* 26.6* 26.8*  MCV 77.1* 77.8* 77.0*  MCH 22.9* 23.1* 23.0*  MCHC 29.7* 29.7* 29.9*  RDW 20.4* 20.8* 21.2*  PLT 289 328 323    BNPNo results for input(s): BNP, PROBNP in the last 168 hours.   DDimer No results for input(s): DDIMER in the last 168 hours.   Radiology    US Venous Img Lower Bilateral (DVT)  Result Date: 05/26/2019 CLINICAL DATA:  Bilateral lower extremity edema for the past week. History of malignancy. Evaluate for DVT. EXAM: BILATERAL LOWER EXTREMITY VENOUS DOPPLER ULTRASOUND TECHNIQUE: Gray-scale sonography with graded compression, as well as color Doppler and duplex ultrasound were performed to evaluate the lower extremity deep venous systems from the level of the common femoral vein and including the common femoral, femoral, profunda femoral, popliteal and calf veins including the posterior tibial, peroneal and gastrocnemius veins when visible. The superficial great saphenous vein was also interrogated. Spectral Doppler was utilized to evaluate flow at rest and with distal augmentation  maneuvers in the common femoral, femoral and popliteal veins. COMPARISON:  Right lower extremity venous Doppler ultrasound-02/25/2026 (negative). FINDINGS: RIGHT LOWER EXTREMITY Common Femoral Vein: No evidence of thrombus. Normal compressibility, respiratory phasicity and response to augmentation. Saphenofemoral Junction: No evidence of thrombus. Normal compressibility and flow on color Doppler imaging. Profunda Femoral Vein: No evidence of thrombus. Normal compressibility and flow on color Doppler imaging. Femoral Vein: No evidence of thrombus. Normal compressibility, respiratory phasicity and response to augmentation. Popliteal Vein: No evidence of thrombus. Normal compressibility, respiratory phasicity and response to augmentation. Calf Veins: No evidence of thrombus. Normal compressibility and flow on color Doppler imaging. Superficial Great Saphenous Vein: No evidence of thrombus. Normal compressibility. Venous Reflux:  None. Other Findings:  None. LEFT LOWER EXTREMITY Common Femoral Vein: No evidence of thrombus. Normal compressibility, respiratory phasicity and response to augmentation. Saphenofemoral Junction: No evidence of thrombus. Normal compressibility and flow on color Doppler imaging. Profunda Femoral Vein: No evidence of thrombus. Normal compressibility and flow on color Doppler imaging. Femoral Vein: No evidence of thrombus. Normal compressibility, respiratory phasicity and response to augmentation. Popliteal Vein: No evidence of thrombus. Normal compressibility, respiratory phasicity and response to augmentation. Calf Veins: No evidence of thrombus. Normal compressibility and flow on color Doppler imaging. Superficial Great Saphenous Vein: No evidence of thrombus. Normal compressibility. Venous Reflux:  None. Other Findings:  None. IMPRESSION: No evidence of DVT within either lower extremity. Electronically Signed   By: Sandi Mariscal M.D.   On: 05/26/2019 17:08    Cardiac Studies    Echocardiogram 05/21/19 . Left ventricular ejection fraction, by estimation, is 35 to 40%. The  left ventricle has moderately decreased function. The left ventricle  demonstrates global hypokinesis. The left ventricular internal cavity size  was moderately to severely dilated.  There is mild left ventricular hypertrophy. Left ventricular diastolic  parameters are indeterminate. There is akinesis of the left ventricular,  basal inferior segment.  2. Right ventricular systolic function is mildly reduced. The right  ventricular size is mildly enlarged. There is moderately elevated  pulmonary artery systolic pressure.  3. Left atrial size was severely dilated.  4. Right atrial size was moderately dilated.  5. Moderate pleural effusion in the left lateral region.  6. The mitral valve is degenerative. Mild to moderate mitral valve  regurgitation.  7. Tricuspid valve regurgitation is severe.  8. The aortic valve has an indeterminant number of cusps. Aortic valve  regurgitation is mild. Mild aortic valve sclerosis is present, with no  evidence of aortic valve stenosis.  9. Aortic dilatation noted. There is mild dilatation of the aortic root  and of the ascending aorta measuring 43 mm.  10. The inferior vena cava is dilated in size with <50% respiratory  variability, suggesting right atrial pressure of 15 mmHg.  Patient Profile     85 y.o. male 85 year old gentleman with history of CAD status post CABG, PCI, heart failure reduced ejection fraction last EF 35 to 40%, status post ICD 2014, permanent A. fib not on anticoagulation due to GI bleed, deafness who is being seen for volume overload  Assessment & Plan    1. Edema, HFrEF EF 35-40% -Still with 2+ edema in the lower extremity -Net -1.2 L over the past 24 hours -Creatinine has worsened.  Decrease IV Lasix to 40mg  twice daily. -Monitor creatinine closely -Continue Coreg, low blood pressure prevents addition of other heart  failure therapy.  2.  History of CAD status post CABG, PCI -Currently with no chest pain -Continue Coreg and statin.  History of GI bleed prevents anticoagulant usage.  3.  Permanent A. Fib -Heart rate controlled.  Currently paced rhythm. -Not on anticoagulation due to history of GI bleeds, known AVM.[name]      Signed, Kate Sable, MD  05/28/2019, 11:20 AM

## 2019-05-28 NOTE — Progress Notes (Signed)
MD notified. Pts BP is low and HR is 59. Orders to hold IV lasix and coreg this afternoon. No new orders at this time. I will continue to assess.

## 2019-05-29 ENCOUNTER — Ambulatory Visit: Payer: PPO | Admitting: Family

## 2019-05-29 LAB — BASIC METABOLIC PANEL
Anion gap: 4 — ABNORMAL LOW (ref 5–15)
BUN: 25 mg/dL — ABNORMAL HIGH (ref 8–23)
CO2: 30 mmol/L (ref 22–32)
Calcium: 8 mg/dL — ABNORMAL LOW (ref 8.9–10.3)
Chloride: 100 mmol/L (ref 98–111)
Creatinine, Ser: 1.38 mg/dL — ABNORMAL HIGH (ref 0.61–1.24)
GFR calc Af Amer: 55 mL/min — ABNORMAL LOW (ref >60)
GFR calc non Af Amer: 47 mL/min — ABNORMAL LOW (ref >60)
Glucose, Bld: 92 mg/dL (ref 70–99)
Potassium: 4.4 mmol/L (ref 3.5–5.1)
Sodium: 134 mmol/L — ABNORMAL LOW (ref 135–145)

## 2019-05-29 LAB — MAGNESIUM: Magnesium: 2.5 mg/dL — ABNORMAL HIGH (ref 1.7–2.4)

## 2019-05-29 LAB — CBC
HCT: 26.3 % — ABNORMAL LOW (ref 39.0–52.0)
Hemoglobin: 7.9 g/dL — ABNORMAL LOW (ref 13.0–17.0)
MCH: 23.2 pg — ABNORMAL LOW (ref 26.0–34.0)
MCHC: 30 g/dL (ref 30.0–36.0)
MCV: 77.1 fL — ABNORMAL LOW (ref 80.0–100.0)
Platelets: 314 10*3/uL (ref 150–400)
RBC: 3.41 MIL/uL — ABNORMAL LOW (ref 4.22–5.81)
RDW: 21.1 % — ABNORMAL HIGH (ref 11.5–15.5)
WBC: 6.5 10*3/uL (ref 4.0–10.5)
nRBC: 0 % (ref 0.0–0.2)

## 2019-05-29 MED ORDER — ENSURE ENLIVE PO LIQD: 237.0000 mL | Freq: Three times a day (TID) | ORAL | 12 refills | Status: DC

## 2019-05-29 MED ORDER — ADULT MULTIVITAMIN W/MINERALS CH: 1.0000 | ORAL_TABLET | Freq: Every day | ORAL | Status: AC

## 2019-05-29 MED ORDER — TORSEMIDE 20 MG PO TABS
40.0000 mg | ORAL_TABLET | Freq: Every day | ORAL | Status: DC
  Administered 2019-05-30: 40 mg via ORAL
  Filled 2019-05-29: qty 2

## 2019-05-29 MED ORDER — OXYCODONE HCL 5 MG PO TABS: 5.0000 mg | ORAL_TABLET | Freq: Four times a day (QID) | ORAL | 0 refills | Status: AC | PRN

## 2019-05-29 MED ORDER — TORSEMIDE 20 MG PO TABS: 40.0000 mg | ORAL_TABLET | Freq: Every day | ORAL | 1 refills | Status: DC

## 2019-05-29 NOTE — Progress Notes (Signed)
Notified on call provider about not being able to contact wife for discharge. Spoke with the patient's son about the current discharge plan and not being able to contact the patient's wife.

## 2019-05-29 NOTE — TOC Transition Note (Signed)
Transition of Care [facility]) - CM/SW Discharge Note   Patient Details  Name: Thomas [name] MRN: [id number] Date of Birth: Jan 30, 1935  [facility] CM/SW Contact:  Eileen Stanford, LCSW Phone Number: 05/29/2019, 3:28 PM   Clinical Narrative:   CSW explained to pt at bedside that [facility] will service pt and provide PT at his home, twice a week. Pt understanding. Pt's spouse not at bedside yet. No additional needs at this time.    Final next level of care: Home w Home Health Services Barriers to Discharge: No Barriers Identified   Patient Goals and CMS Choice Patient states their goals for this hospitalization and ongoing recovery are:: to get stronger   Choice offered to / list presented to : Patient  Discharge Placement                Patient to be transferred to facility by: Pts spouse will transport pt home   Patient and family notified of of transfer: 05/29/19  Discharge Plan and Services In-house Referral: Clinical Social Work   Post Acute Care Choice: [place]                    HH Arranged: PT [facility] Agency: [facility] Date [place]: 05/29/19 Time [place]: 1528 Representative spoke with at [place]: [place] ([place]) Interventions     Readmission Risk Interventions Readmission Risk Prevention Plan 05/26/2019  Transportation Screening Complete  PCP or Specialist Appt within 3-5 Days Complete  HRI or [place] Complete  Social Work Consult for [place] Planning/Counseling Complete  Palliative Care Screening Not Applicable  Medication Review Press photographer) Complete  Some recent data might be hidden

## 2019-05-29 NOTE — Discharge Summary (Signed)
Physician Discharge Summary  Thomas [name] [id number] DOB: June 09, 1934 DOA: 05/19/2019  PCP: [name], MD  Admit date: 05/19/2019 Discharge date: 05/30/2019  Admitted From: home Disposition:  home  Recommendations for Outpatient Follow-up:  1. Follow up with PCP in 1-2 weeks 2. Please obtain BMP/CBC in one week 3. 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 Covid is negative. BNP is elevated 638. Work-up has revealed a cirrhotic liver. He was aggressively diuresed with daily weights and I's and O's.Cardiology consulted.    Acute decompensation ofcombined systolic/diastolicCHF Anasarca Starling forces apparently improved with diuresis, creatininecontinues to improve.Has moderate pleural effusions bilaterally, will defer thoracentesis for now and continue diuresis.Net fluid balance over -13 L to date (unclear if inputs are accurate). Lower extremity edema greatly improved.  Has very low albumin so total resolution of edema is unlikely.  Lower extremity duplex U/S ruled out DVT bilaterally. --Cardiology following --discharged on torsemide 40 mg daily --continueCoreg, hold for soft BP or bradycardia --spironolactone& losartan held due to borderline blood pressures --daily weights at home --provided patient detailed written instructions and discussed with interpreter - he needs to check BP at home before taking meds, and if low, then hold the diuretic and Coreg.  He expressed understanding and agreement.  Cirrhosis Patient with early cirrhosis on ultrasound, without  ascites seen. --torsemide as above --holdAldactonedue to soft BP's with diuresis  Acute on Chronic Microcytic Anemia - baseline Hbg appears to be 7.9-8.6, now in low 7's. Has known GI AVM's, so likely some chronic blood loss. Patient with SOB worse than baseline recently, and persistent despite diuresis, concern for symptomatic anemia contributing.Transfused 1 unit pRBC's on 3/11. --monitor CBC --transfuse if Hbg < 7.0  "Black tarry stools" per nursing report- resolved History of GI bleed Patient hemodynamically stable,H&H stable, butdoes have apriorhistory of GI bleed. --monitor H&H --consider GI consult if further drop in Hbg or ongoing bleeding --recommend outpatient GI follow up --avoidallNSAIDs and antiplatelets  Acutekidney injury superimposed onCKDstage IIIa - improved with diuresis given likely improvement in Starling forces. Risk of hepatorenal syndrome. --monitor renal function and electrolytes  Liver nodule Discussed liver nodule with interventional radiology, they do not think this is a particularly concerningfinding, possibly over-read. Recommendation is for noncontrasted MRI to be done after patient is adequately diuresed (so he can lie still and flat during MRI) to see if this nodule is in fact a nodule or just part of the heterogenous architecture of his liver.  Cough with recent Covid exposure-improving with diuresis. COVID-19 PCR negative. Chest CT without infiltrates. Procalcitonin was mildly elevated but patient afebrile and without leukocytosis or other clinical signs of infection. Empiric antibiotics started on admission discontinued. --Monitor for signs symptoms of infection --Follow CBC  Elevated troponin-secondary to demand ischemia per cardiology. Troponin peaked at 411 andtrended down. Patient without chest pain.  Atrial fibrillation-with biventricular pacemaker.Not on anticoagulation presumably due to history of  recurrent GI bleeding. Rate controlled without medication. --Monitor  Essential hypertension-currently with borderline soft blood pressures --Hold antihypertensives    Discharge Diagnoses: Principal Problem:   Anasarca Active Problems:   CAD Status post coronary artery bypass grafting: 1995   Permanent atrial fibrillation (  HCC)   Chronic combined systolic and diastolic heart failure (HCC)   Benign essential HTN   CAD S/P multiple PCIs   Elevated troponin   Acute on chronic systolic CHF (congestive heart failure) (HCC)   CRI (chronic renal insufficiency), stage 3 (moderate)   Decompensated hepatic cirrhosis (HCC)   Leg swelling    Discharge Instructions   Discharge Instructions    (HEART FAILURE PATIENTS) Call MD:  Anytime you have any of the following symptoms: 1) 3 pound weight gain in 24 hours or 5 pounds in 1 week 2) shortness of breath, with or without a dry hacking cough 3) swelling in the hands, feet or stomach 4) if you have to sleep on extra pillows at night in order to breathe.   Complete by: As directed    AMB referral to CHF clinic   Complete by: As directed    Amb Referral to Cardiac Rehabilitation   Complete by: As directed    Diagnosis: Heart Failure (see criteria below if ordering Phase II)   Heart Failure Type: Chronic Systolic   After initial evaluation and assessments completed: Virtual Based Care may be provided alone or in conjunction with Phase 2 Cardiac Rehab based on patient barriers.: Yes   Call MD for:   Complete by: As directed    Worsening leg swelling   Call MD for:  persistant dizziness or light-headedness   Complete by: As directed    Diet - low sodium heart healthy   Complete by: As directed    Increase activity slowly   Complete by: As directed      Allergies as of 05/29/2019      Reactions   Entresto [sacubitril-valsartan] Swelling   And bruising of arm   Phenazopyridine Nausea Only, Other (See Comments)   GI UPSET   Ramipril  Other (See Comments)   unk Other reaction(s): Other (See Comments), Unknown unk      Medication List    STOP taking these medications   Eliquis 5 MG Tabs tablet Generic drug: apixaban   furosemide 40 MG tablet Commonly known as: LASIX   isosorbide mononitrate 30 MG 24 hr tablet Commonly known as: IMDUR   losartan-hydrochlorothiazide 50-12.5 MG tablet Commonly known as: HYZAAR     TAKE these medications   albuterol 108 (90 Base) MCG/ACT inhaler Commonly known as: VENTOLIN HFA Inhale 2 puffs into the lungs 4 (four) times daily as needed. What changed: reasons to take this   carvedilol 3.125 MG tablet Commonly known as: COREG Take 1 tablet (3.125 mg total) by mouth 2 (two) times daily with a meal. What changed:   how much to take  when to take this   [employer] Acetaminophen Ex St 500 MG tablet Generic drug: acetaminophen Take 500-1,000 mg by mouth every 6 (six) hours as needed for pain or fever.   feeding supplement (ENSURE ENLIVE) Liqd Take 237 mLs by mouth 3 (three) times daily between meals.   ferrous sulfate 325 (65 FE) MG tablet Take 1 tablet (325 mg total) by mouth daily for 30 days.   multivitamin with minerals Tabs tablet Take 1 tablet by mouth daily.   oxyCODONE 5 MG immediate release tablet Commonly known as: Oxy IR/ROXICODONE Take 1 tablet (5 mg total) by mouth every 6 (six) hours as needed for up to 3 days for severe pain.   pantoprazole 40 MG tablet Commonly known as: PROTONIX Take 1 tablet (40 mg total) by mouth 2 (two) times daily before a meal. What  changed: when to take this   simvastatin 20 MG tablet Commonly known as: ZOCOR TAKE 1 TABLET BY MOUTH EVERY DAY   torsemide 20 MG tablet Commonly known as: DEMADEX Take 2 tablets (40 mg total) by mouth daily. Start taking on: May 30, 2019   triamcinolone cream 0.1 % Commonly known as: KENALOG Apply 1 application topically 2 (two) times daily as needed for itching. (avoid face, groin and  axilla)   vitamin B-12 500 MCG tablet Commonly known as: CYANOCOBALAMIN Take 1 tablet (500 mcg total) by mouth daily.       Contact information for follow-up providers    [facility] up on 06/05/2019.   Specialty: Cardiology Why: at 10:00am. Enter through the [place] entrance Contact information: [place] [phone]           Contact information for after-discharge care    [facility] .   Service: Skilled Nursing Contact information: [employer] [place] [phone]                 Allergies  Allergen Reactions  . Entresto [Sacubitril-Valsartan] Swelling    And bruising of arm  . Phenazopyridine Nausea Only and Other (See Comments)    GI UPSET  . Ramipril Other (See Comments)    unk Other reaction(s): Other (See Comments), Unknown unk    Consultations:  Cardiology    Procedures/Studies: CT Chest Wo Contrast  Result Date: 05/19/2019 CLINICAL DATA:  Shortness of breath. EXAM: CT CHEST WITHOUT CONTRAST TECHNIQUE: Multidetector CT imaging of the chest was performed following the standard protocol without IV contrast. COMPARISON:  None recent FINDINGS: Cardiovascular: The heart size is significantly enlarged. Advanced coronary artery calcifications are noted. The patient is status post prior median sternotomy. Atherosclerotic changes are noted of the thoracic aorta. There is a thoracic aortic aneurysm of the ascending aorta measuring approximately 4.7 cm. There is no significant pericardial effusion. Mediastinum/Nodes: --No mediastinal or hilar lymphadenopathy. --No axillary lymphadenopathy. --No supraclavicular lymphadenopathy. --Normal thyroid gland. --The esophagus is unremarkable Lungs/Pleura: There are moderate-sized bilateral pleural effusions. The lung fields are poorly evaluated  secondary to extensive respiratory motion artifact. There is atelectasis at the lung bases bilaterally. There is atelectasis in the lingula. The trachea is unremarkable. There is no pneumothorax. There is slightly prominent interstitial lung markings. Upper Abdomen: The liver appears cirrhotic. There is a cyst within the left hepatic lobe which is essentially stable from prior studies. Musculoskeletal: No chest wall abnormality. No acute or significant osseous findings. IMPRESSION: 1. Cardiomegaly with moderate-sized bilateral pleural effusions in adjacent compressive atelectasis. 2. Aneurysmal dilatation of the ascending aorta measuring approximately 4.7 cm. Ascending thoracic aortic aneurysm. Recommend semi-annual imaging followup by CTA or MRA and referral to cardiothoracic surgery if not already obtained. This recommendation follows 2010 ACCF/AHA/AATS/ACR/ASA/SCA/SCAI/SIR/STS/SVM Guidelines for the Diagnosis and Management of Patients With Thoracic Aortic Disease. Circulation. 2010; [id number]. Aortic aneurysm NOS (ICD10-I71.9) 3. Cirrhotic appearing liver. Aortic Atherosclerosis (ICD10-I70.0). Electronically Signed   By: Constance Holster M.D.   On: 05/19/2019 19:12   US Venous Img Lower Bilateral (DVT)  Result Date: 05/26/2019 CLINICAL DATA:  Bilateral lower extremity edema for the past week. History of malignancy. Evaluate for DVT. EXAM: BILATERAL LOWER EXTREMITY VENOUS DOPPLER ULTRASOUND TECHNIQUE: Gray-scale sonography with graded compression, as well as color Doppler and duplex ultrasound were performed to evaluate the lower extremity deep  venous systems from the level of the common femoral vein and including the common femoral, femoral, profunda femoral, popliteal and calf veins including the posterior tibial, peroneal and gastrocnemius veins when visible. The superficial great saphenous vein was also interrogated. Spectral Doppler was utilized to evaluate flow at rest and with distal  augmentation maneuvers in the common femoral, femoral and popliteal veins. COMPARISON:  Right lower extremity venous Doppler ultrasound-02/25/2026 (negative). FINDINGS: RIGHT LOWER EXTREMITY Common Femoral Vein: No evidence of thrombus. Normal compressibility, respiratory phasicity and response to augmentation. Saphenofemoral Junction: No evidence of thrombus. Normal compressibility and flow on color Doppler imaging. Profunda Femoral Vein: No evidence of thrombus. Normal compressibility and flow on color Doppler imaging. Femoral Vein: No evidence of thrombus. Normal compressibility, respiratory phasicity and response to augmentation. Popliteal Vein: No evidence of thrombus. Normal compressibility, respiratory phasicity and response to augmentation. Calf Veins: No evidence of thrombus. Normal compressibility and flow on color Doppler imaging. Superficial Great Saphenous Vein: No evidence of thrombus. Normal compressibility. Venous Reflux:  None. Other Findings:  None. LEFT LOWER EXTREMITY Common Femoral Vein: No evidence of thrombus. Normal compressibility, respiratory phasicity and response to augmentation. Saphenofemoral Junction: No evidence of thrombus. Normal compressibility and flow on color Doppler imaging. Profunda Femoral Vein: No evidence of thrombus. Normal compressibility and flow on color Doppler imaging. Femoral Vein: No evidence of thrombus. Normal compressibility, respiratory phasicity and response to augmentation. Popliteal Vein: No evidence of thrombus. Normal compressibility, respiratory phasicity and response to augmentation. Calf Veins: No evidence of thrombus. Normal compressibility and flow on color Doppler imaging. Superficial Great Saphenous Vein: No evidence of thrombus. Normal compressibility. Venous Reflux:  None. Other Findings:  None. IMPRESSION: No evidence of DVT within either lower extremity. Electronically Signed   By: Sandi Mariscal M.D.   On: 05/26/2019 17:08   DG Chest Portable 1  View  Result Date: 05/19/2019 CLINICAL DATA:  Dyspnea. EXAM: PORTABLE CHEST 1 VIEW COMPARISON:  Chest radiograph 10/11/2018 FINDINGS: Redemonstrated left chest AICD device. Prior median sternotomy with unchanged cardiomegaly. Aortic atherosclerosis. Pulmonary vascular congestion is similar to prior examination. Bilateral pleural effusions (small left, trace right) with associated left basilar atelectasis. No evidence of pneumothorax. No acute bony abnormality. IMPRESSION: No significant interval change as compared to chest radiograph 10/11/2018. Persistent bilateral pleural effusions (small left, trace right) with left basilar atelectasis. Pneumonia at the left lung base cannot be excluded. Unchanged cardiomegaly with pulmonary vascular congestion. Aortic atherosclerosis. Electronically Signed   By: Kellie Simmering DO   On: 05/19/2019 16:06   ECHOCARDIOGRAM COMPLETE  Result Date: 05/21/2019    ECHOCARDIOGRAM REPORT   Patient Name:   Thomas [name] Date of Exam: 05/21/2019 Medical Rec #:  [id number]      Height:       74.0 in Accession #:    [id number]     Weight:       227.2 lb Date of Birth:  Nov 24, 1934     BSA:          2.295 m Patient Age:    8 years       BP:           107/45 mmHg Patient Gender: M              HR:           62 bpm. Exam Location:  [facility] Procedure: 2D Echo and Intracardiac Opacification Agent Indications:     Dyspnea 786.09/ R06.00  History:  Patient has prior history of Echocardiogram examinations, most                  recent 05/02/2018.  Sonographer:     Arville Go RDCS Referring Phys:  [id number] [place] Diagnosing Phys: Nelva Bush MD  Sonographer Comments: Technically difficult study due to poor echo windows. Image acquisition challenging due to patient body habitus. IMPRESSIONS  1. Left ventricular ejection fraction, by estimation, is 35 to 40%. The left ventricle has moderately decreased function. The left ventricle demonstrates global hypokinesis. The left  ventricular internal cavity size was moderately to severely dilated. There is mild left ventricular hypertrophy. Left ventricular diastolic parameters are indeterminate. There is akinesis of the left ventricular, basal inferior segment.  2. Right ventricular systolic function is mildly reduced. The right ventricular size is mildly enlarged. There is moderately elevated pulmonary artery systolic pressure.  3. Left atrial size was severely dilated.  4. Right atrial size was moderately dilated.  5. Moderate pleural effusion in the left lateral region.  6. The mitral valve is degenerative. Mild to moderate mitral valve regurgitation.  7. Tricuspid valve regurgitation is severe.  8. The aortic valve has an indeterminant number of cusps. Aortic valve regurgitation is mild. Mild aortic valve sclerosis is present, with no evidence of aortic valve stenosis.  9. Aortic dilatation noted. There is mild dilatation of the aortic root and of the ascending aorta measuring 43 mm. 10. The inferior vena cava is dilated in size with <50% respiratory variability, suggesting right atrial pressure of 15 mmHg. FINDINGS  Left Ventricle: Left ventricular ejection fraction, by estimation, is 35 to 40%. The left ventricle has moderately decreased function. The left ventricle demonstrates global hypokinesis. Definity contrast agent was given IV to delineate the left ventricular endocardial borders. The left ventricular internal cavity size was moderately to severely dilated. There is mild left ventricular hypertrophy. Left ventricular diastolic parameters are indeterminate. Right Ventricle: The right ventricular size is mildly enlarged. No increase in right ventricular wall thickness. Right ventricular systolic function is mildly reduced. There is moderately elevated pulmonary artery systolic pressure. The tricuspid regurgitant velocity is 3.11 m/s, and with an assumed right atrial pressure of 15 mmHg, the estimated right ventricular systolic  pressure is [id number] mmHg. Left Atrium: Left atrial size was severely dilated. Right Atrium: Right atrial size was moderately dilated. Pericardium: There is no evidence of pericardial effusion. Mitral Valve: The mitral valve is degenerative in appearance. There is mild thickening of the mitral valve leaflet(s). Mild to moderate mitral valve regurgitation, with posteriorly-directed jet. Tricuspid Valve: The tricuspid valve is grossly normal. Tricuspid valve regurgitation is severe. The flow in the hepatic veins is reversed during ventricular systole. Aortic Valve: The aortic valve has an indeterminant number of cusps. . There is mild thickening of the aortic valve. Aortic valve regurgitation is mild. Mild aortic valve sclerosis is present, with no evidence of aortic valve stenosis. There is mild thickening of the aortic valve. Aortic valve peak gradient measures 8.9 mmHg. Pulmonic Valve: The pulmonic valve was normal in structure. Pulmonic valve regurgitation is not visualized. No evidence of pulmonic stenosis. Aorta: Aortic dilatation noted. There is mild dilatation of the aortic root and of the ascending aorta measuring 43 mm. Pulmonary Artery: The pulmonary artery is not well seen. Venous: The inferior vena cava is dilated in size with less than 50% respiratory variability, suggesting right atrial pressure of 15 mmHg. IAS/Shunts: The interatrial septum was not well visualized. Additional Comments: A pacer wire  is visualized in the right atrium and right ventricle. There is a moderate pleural effusion in the left lateral region.  LEFT VENTRICLE PLAX 2D LVIDd:         6.38 cm      Diastology LVIDs:         4.90 cm      LV e' lateral:   10.60 cm/s LV PW:         1.30 cm      LV E/e' lateral: 10.8 LV IVS:        1.15 cm      LV e' medial:    8.92 cm/s LVOT diam:     2.10 cm      LV E/e' medial:  12.8 LV SV:         52 LV SV Index:   23 LVOT Area:     3.46 cm  LV Volumes (MOD) LV vol d, MOD A2C: 192.0 ml LV vol d, MOD  A4C: 246.0 ml LV vol s, MOD A2C: 125.0 ml LV vol s, MOD A4C: 153.0 ml LV SV MOD A2C:     67.0 ml LV SV MOD A4C:     246.0 ml LV SV MOD BP:      79.4 ml RIGHT VENTRICLE RV Basal diam:  5.20 cm RV S prime:     10.20 cm/s TAPSE (M-mode): 1.6 cm LEFT ATRIUM            Index       RIGHT ATRIUM           Index LA diam:      6.40 cm  2.79 cm/m  RA Area:     33.00 cm LA Vol (A2C): 122.0 ml 53.17 ml/m RA Volume:   123.00 ml 53.61 ml/m LA Vol (A4C): 164.0 ml 71.48 ml/m  AORTIC VALVE                PULMONIC VALVE AV Area (Vmax): 2.08 cm    PV Vmax:        1.21 m/s AV Vmax:        149.00 cm/s PV Peak grad:   5.9 mmHg AV Peak Grad:   8.9 mmHg    RVOT Peak grad: 4 mmHg LVOT Vmax:      89.40 cm/s LVOT Vmean:     53.200 cm/s LVOT VTI:       0.151 m  AORTA Ao Root diam: 4.30 cm Ao Asc diam:  4.10 cm MITRAL VALVE                TRICUSPID VALVE MV Area (PHT): 2.56 cm     TV Peak grad:   29.4 mmHg MV Decel Time: 296 msec     TV Vmax:        2.71 m/s MV E velocity: 114.00 cm/s  TR Peak grad:   38.7 mmHg                             TR Vmax:        311.00 cm/s                              SHUNTS                             Systemic VTI:  0.15 m  Systemic Diam: 2.10 cm Nelva Bush MD Electronically signed by Nelva Bush MD Signature Date/Time: 05/21/2019/6:51:19 PM    Final    CUP PACEART REMOTE DEVICE CHECK  Result Date: 05/12/2019 Scheduled remote reviewed.  One NS-VT recorded. Normal device function.  Battery estimated 3 months to ERI. Next remote 30 days.  [place] ELASTOGRAPHY LIVER  Result Date: 05/21/2019 CLINICAL DATA:  85 year old male with history of cirrhosis presenting with anasarca. EXAM: US ABDOMEN LIMITED - RIGHT UPPER QUADRANT ULTRASOUND HEPATIC ELASTOGRAPHY TECHNIQUE: Sonography of the right upper quadrant was performed. In addition, ultrasound elastography evaluation of the liver was performed. A region of interest was placed within the right lobe of the liver. Following  application of a compressive sonographic pulse, tissue compressibility was assessed. Multiple assessments were performed at the selected site. Median tissue compressibility was determined. Previously, hepatic stiffness was assessed by shear wave velocity. Based on recently published Society of Radiologists in Ultrasound consensus article, reporting is now recommended to be performed in the SI units of pressure (kiloPascals) representing hepatic stiffness/elasticity. The obtained result is compared to the published reference standards. (cACLD = compensated Advanced Chronic Liver Disease) COMPARISON:  None. FINDINGS: ULTRASOUND ABDOMEN LIMITED RIGHT UPPER QUADRANT Gallbladder: No gallstones or wall thickening visualized. No sonographic Murphy sign noted. Common bile duct: Diameter: 4.8 mm Liver: No focal lesion identified, however, the echogenicity of the liver is diffusely heterogeneous and slightly nodular. In the left lobe of the liver there is a 3.1 x 2.4 x 2.5 cm lesion which is generally anechoic with increased through transmission, although there more are multiple internal septations, compatible with a complex cyst. In the right lobe of the liver there is an additional area of very subtle heterogeneous echogenicity measuring 2.2 x 2.0 cm, suspicious for a solid lesion. Portal vein is patent on color Doppler imaging with normal direction of blood flow towards the liver. ULTRASOUND HEPATIC ELASTOGRAPHY Device: Siemens Helix VTQ Patient position: Supine Transducer 5C1 Number of measurements: 10 Hepatic segment:  8 Median kPa: 1.78 IQR: 0.13 IQR/Median kPa ratio: 0.06 Data quality:  Good Diagnostic category:  < or = 5 kPa: high probability of being normal IMPRESSION: ULTRASOUND RUQ: 1. Mild nodular contour and heterogeneous echogenicity throughout the hepatic parenchyma, suggesting early changes of cirrhosis. 2. Two liver lesions, one of which in the left lobe of the liver appears to represent a complex cyst (as  has been demonstrated on prior CT examinations), while the other lesion in the right lobe of the liver is suspicious for a solid lesion and has not been previously demonstrated. Further evaluation with nonemergent abdominal MRI with and without IV gadolinium is recommended in the near future to exclude the possibility of hepatocellular carcinoma. ULTRASOUND HEPATIC ELASTOGRAPHY: Median kPa:  1.78 Diagnostic category:  < or = 5 kPa: high probability of being normal The use of hepatic elastography is applicable to patients with viral hepatitis and non-alcoholic fatty liver disease. At this time, there is insufficient data for the referenced cut-off values and use in other causes of liver disease, including alcoholic liver disease. Patients, however, may be assessed by elastography and serve as their own reference standard/baseline. In patients with non-alcoholic liver disease, the values suggesting compensated advanced chronic liver disease (cACLD) may be lower, and patients may need additional testing with elasticity results of 7-9 kPa. Please note that abnormal hepatic elasticity and shear wave velocities may also be identified in clinical settings other than with hepatic fibrosis, such as: acute hepatitis, elevated right heart and central venous  pressures including use of beta blockers, veno-occlusive disease (Budd-Chiari), infiltrative processes such as mastocytosis/amyloidosis/infiltrative tumor/lymphoma, extrahepatic cholestasis, with hyperemia in the post-prandial state, and with liver transplantation. Correlation with patient history, laboratory data, and clinical condition recommended. Diagnostic Categories: < or =5 kPa: high probability of being normal < or =9 kPa: in the absence of other known clinical signs, rules out cACLD >9 kPa and ?13 kPa: suggestive of cACLD, but needs further testing >13 kPa: highly suggestive of cACLD > or =17 kPa: highly suggestive of cACLD with an increased probability of  clinically significant portal hypertension Electronically Signed   By: Vinnie Langton M.D.   On: 05/21/2019 11:18   US Abdomen Limited RUQ  Result Date: 05/21/2019 CLINICAL DATA:  85 year old male with history of cirrhosis presenting with anasarca. EXAM: US ABDOMEN LIMITED - RIGHT UPPER QUADRANT ULTRASOUND HEPATIC ELASTOGRAPHY TECHNIQUE: Sonography of the right upper quadrant was performed. In addition, ultrasound elastography evaluation of the liver was performed. A region of interest was placed within the right lobe of the liver. Following application of a compressive sonographic pulse, tissue compressibility was assessed. Multiple assessments were performed at the selected site. Median tissue compressibility was determined. Previously, hepatic stiffness was assessed by shear wave velocity. Based on recently published Society of Radiologists in Ultrasound consensus article, reporting is now recommended to be performed in the SI units of pressure (kiloPascals) representing hepatic stiffness/elasticity. The obtained result is compared to the published reference standards. (cACLD = compensated Advanced Chronic Liver Disease) COMPARISON:  None. FINDINGS: ULTRASOUND ABDOMEN LIMITED RIGHT UPPER QUADRANT Gallbladder: No gallstones or wall thickening visualized. No sonographic Murphy sign noted. Common bile duct: Diameter: 4.8 mm Liver: No focal lesion identified, however, the echogenicity of the liver is diffusely heterogeneous and slightly nodular. In the left lobe of the liver there is a 3.1 x 2.4 x 2.5 cm lesion which is generally anechoic with increased through transmission, although there more are multiple internal septations, compatible with a complex cyst. In the right lobe of the liver there is an additional area of very subtle heterogeneous echogenicity measuring 2.2 x 2.0 cm, suspicious for a solid lesion. Portal vein is patent on color Doppler imaging with normal direction of blood flow towards the liver.  ULTRASOUND HEPATIC ELASTOGRAPHY Device: Siemens Helix VTQ Patient position: Supine Transducer 5C1 Number of measurements: 10 Hepatic segment:  8 Median kPa: 1.78 IQR: 0.13 IQR/Median kPa ratio: 0.06 Data quality:  Good Diagnostic category:  < or = 5 kPa: high probability of being normal IMPRESSION: ULTRASOUND RUQ: 1. Mild nodular contour and heterogeneous echogenicity throughout the hepatic parenchyma, suggesting early changes of cirrhosis. 2. Two liver lesions, one of which in the left lobe of the liver appears to represent a complex cyst (as has been demonstrated on prior CT examinations), while the other lesion in the right lobe of the liver is suspicious for a solid lesion and has not been previously demonstrated. Further evaluation with nonemergent abdominal MRI with and without IV gadolinium is recommended in the near future to exclude the possibility of hepatocellular carcinoma. ULTRASOUND HEPATIC ELASTOGRAPHY: Median kPa:  1.78 Diagnostic category:  < or = 5 kPa: high probability of being normal The use of hepatic elastography is applicable to patients with viral hepatitis and non-alcoholic fatty liver disease. At this time, there is insufficient data for the referenced cut-off values and use in other causes of liver disease, including alcoholic liver disease. Patients, however, may be assessed by elastography and serve as their own reference standard/baseline. In patients  with non-alcoholic liver disease, the values suggesting compensated advanced chronic liver disease (cACLD) may be lower, and patients may need additional testing with elasticity results of 7-9 kPa. Please note that abnormal hepatic elasticity and shear wave velocities may also be identified in clinical settings other than with hepatic fibrosis, such as: acute hepatitis, elevated right heart and central venous pressures including use of beta blockers, veno-occlusive disease (Budd-Chiari), infiltrative processes such as  mastocytosis/amyloidosis/infiltrative tumor/lymphoma, extrahepatic cholestasis, with hyperemia in the post-prandial state, and with liver transplantation. Correlation with patient history, laboratory data, and clinical condition recommended. Diagnostic Categories: < or =5 kPa: high probability of being normal < or =9 kPa: in the absence of other known clinical signs, rules out cACLD >9 kPa and ?13 kPa: suggestive of cACLD, but needs further testing >13 kPa: highly suggestive of cACLD > or =17 kPa: highly suggestive of cACLD with an increased probability of clinically significant portal hypertension Electronically Signed   By: Vinnie Langton M.D.   On: 05/21/2019 11:18        Subjective: Patient seen this AM with ASL virtual interpreter.  He reports he feels great.  Denies SOB or chest pain or other complaints.  Reviewed instructions for medications again, including need to check BP before taking meds and holding them if low.  He agrees and states understanding.   Discharge Exam: Vitals:   05/29/19 0438 05/29/19 0721  BP: 109/65 107/66  Pulse: 67 69  Resp: 18 18  Temp: 98.5 F (36.9 C) 97.9 F (36.6 C)  SpO2: 94% 94%   Vitals:   05/28/19 1937 05/29/19 0255 05/29/19 0438 05/29/19 0721  BP: (!) 104/59  109/65 107/66  Pulse: 62  67 69  Resp: 20  18 18   Temp: 98.1 F (36.7 C)  98.5 F (36.9 C) 97.9 F (36.6 C)  TempSrc: Oral  Oral Oral  SpO2: 94%  94% 94%  Weight:  90.4 kg    Height:        General: Pt is alert, awake, not in acute distress Cardiovascular: RRR, S1/S2 +, no rubs, no gallops Respiratory: CTA bilaterally, no wheezing, no rhonchi Abdominal: Soft, NT, ND, bowel sounds + Extremities: greatly improved pitting edema L>R with stable chronic skin changes, no cyanosis    The results of significant diagnostics from this hospitalization (including imaging, microbiology, ancillary and laboratory) are listed below for reference.     Microbiology: Recent Results (from  the past 240 hour(s))  SARS CORONAVIRUS 2 (TAT 6-24 HRS) Nasopharyngeal Nasopharyngeal Swab     Status: None   Collection Time: 05/19/19  3:53 PM   Specimen: Nasopharyngeal Swab  Result Value Ref Range Status   SARS Coronavirus 2 NEGATIVE NEGATIVE Final    Comment: (NOTE) SARS-CoV-2 target nucleic acids are NOT DETECTED. The SARS-CoV-2 RNA is generally detectable in upper and lower respiratory specimens during the acute phase of infection. Negative results do not preclude SARS-CoV-2 infection, do not rule out co-infections with other pathogens, and should not be used as the sole basis for treatment or other patient management decisions. Negative results must be combined with clinical observations, patient history, and epidemiological information. The expected result is Negative. Fact Sheet for Patients: SugarRoll.be Fact Sheet for Healthcare Providers: https://www.woods-mathews.com/ This test is not yet approved or cleared by the [place] FDA and  has been authorized for detection and/or diagnosis of SARS-CoV-2 by FDA under an Emergency Use Authorization (EUA). This EUA will remain  in effect (meaning this test can be used) for the  duration of the COVID-19 declaration under Section 56 4(b)(1) of the Act, 21 U.S.C. section 360bbb-3(b)(1), unless the authorization is terminated or revoked sooner. Performed at [facility], [place] [address], [place], [place] [address]      Labs: BNP (last 3 results) Recent Labs    08/11/18 2246 05/19/19 1404  BNP 316.9* 123XX123*   Basic Metabolic Panel: Recent Labs  Lab 05/25/19 0348 05/26/19 0504 05/27/19 0447 05/28/19 0520 05/29/19 0524  NA 140 138 133* 137 134*  K 4.0 4.3 4.2 4.3 4.4  CL 103 102 98 102 100  CO2 31 29 27 29 30   GLUCOSE 98 93 78 84 92  BUN 28* 25* 27* 26* 25*  CREATININE 1.21 1.36* 1.38* 1.55* 1.38*  CALCIUM 7.6* 7.7* 7.8* 8.0* 8.0*  MG 2.2 2.3 2.5* 2.6* 2.5*    Liver Function Tests: Recent Labs  Lab 05/24/19 0413  AST 43*  ALT 27  ALKPHOS 117  BILITOT 0.7  PROT 4.8*  ALBUMIN 1.7*   No results for input(s): LIPASE, AMYLASE in the last 168 hours. No results for input(s): AMMONIA in the last 168 hours. CBC: Recent Labs  Lab 05/25/19 0348 05/26/19 0504 05/27/19 0447 05/28/19 0520 05/29/19 0524  WBC 6.5 7.2 6.6 6.4 6.5  HGB 7.2* 8.1* 7.9* 8.0* 7.9*  HCT 24.6* 27.3* 26.6* 26.8* 26.3*  MCV 76.9* 77.1* 77.8* 77.0* 77.1*  PLT 271 289 328 323 314   Cardiac Enzymes: No results for input(s): CKTOTAL, CKMB, CKMBINDEX, TROPONINI in the last 168 hours. BNP: Invalid input(s): POCBNP CBG: No results for input(s): GLUCAP in the last 168 hours. D-Dimer No results for input(s): DDIMER in the last 72 hours. Hgb A1c No results for input(s): HGBA1C in the last 72 hours. Lipid Profile No results for input(s): CHOL, HDL, LDLCALC, TRIG, CHOLHDL, LDLDIRECT in the last 72 hours. Thyroid function studies No results for input(s): TSH, T4TOTAL, T3FREE, THYROIDAB in the last 72 hours.  Invalid input(s): FREET3 Anemia work up No results for input(s): VITAMINB12, FOLATE, FERRITIN, TIBC, IRON, RETICCTPCT in the last 72 hours. Urinalysis    Component Value Date/Time   COLORURINE YELLOW 08/11/2018 1954   APPEARANCEUR CLEAR 08/11/2018 1954   APPEARANCEUR Clear 12/13/2017 0918   LABSPEC 1.021 08/11/2018 1954   LABSPEC 1.021 11/19/2011 1501   PHURINE 5.0 08/11/2018 1954   GLUCOSEU NEGATIVE 08/11/2018 1954   GLUCOSEU Negative 11/19/2011 1501   HGBUR NEGATIVE 08/11/2018 [place] NEGATIVE 08/11/2018 1954   BILIRUBINUR Negative 12/13/2017 0918   BILIRUBINUR Negative 11/19/2011 1501   [place] 08/11/2018 [place] NEGATIVE 08/11/2018 1954   NITRITE NEGATIVE 08/11/2018 1954   LEUKOCYTESUR NEGATIVE 08/11/2018 1954   LEUKOCYTESUR Negative 11/19/2011 1501   Sepsis Labs Invalid input(s): PROCALCITONIN,  WBC,   LACTICIDVEN Microbiology Recent Results (from the past 240 hour(s))  SARS CORONAVIRUS 2 (TAT 6-24 HRS) Nasopharyngeal Nasopharyngeal Swab     Status: None   Collection Time: 05/19/19  3:53 PM   Specimen: Nasopharyngeal Swab  Result Value Ref Range Status   SARS Coronavirus 2 NEGATIVE NEGATIVE Final    Comment: (NOTE) SARS-CoV-2 target nucleic acids are NOT DETECTED. The SARS-CoV-2 RNA is generally detectable in upper and lower respiratory specimens during the acute phase of infection. Negative results do not preclude SARS-CoV-2 infection, do not rule out co-infections with other pathogens, and should not be used as the sole basis for treatment or other patient management decisions. Negative results must be combined with clinical observations, patient history, and epidemiological  information. The expected result is Negative. Fact Sheet for Patients: SugarRoll.be Fact Sheet for Healthcare Providers: https://www.woods-mathews.com/ This test is not yet approved or cleared by the [place] FDA and  has been authorized for detection and/or diagnosis of SARS-CoV-2 by FDA under an Emergency Use Authorization (EUA). This EUA will remain  in effect (meaning this test can be used) for the duration of the COVID-19 declaration under Section 56 4(b)(1) of the Act, 21 U.S.C. section 360bbb-3(b)(1), unless the authorization is terminated or revoked sooner. Performed at [facility], [place] [address] [place], [place] [address]      Time coordinating discharge: Over 30 minutes  SIGNED:   Ezekiel Slocumb, DO [facility] 05/29/2019, 11:11 AM   If 7PM-7AM, please contact night-coverage [email or web address]

## 2019-05-29 NOTE — TOC Progression Note (Addendum)
Transition of Care [facility]) - Progression Note    Patient Details  Name: Thomas [name] MRN: [id number] Date of Birth: [birth year]  [facility] of [facility] CM/SW Contact  Eileen Stanford, LCSW Phone Number: 05/29/2019, 12:31 PM  Clinical Narrative:   PT has updated recommendation to [facility]. CSW will reach out to [employer] to see if they can service pt given insurance.  1:20 [facility] can service pt, however the soonest they can come out to the house is Friday. PT and MD notified--they are ok with it. Pt will d/c today.    Expected Discharge Plan: [place] Barriers to Discharge: Continued Medical Work up  Expected Discharge Plan and Services Expected Discharge Plan: [place] In-house Referral: Clinical Social Work   Post Acute Care Choice: [place] Living arrangements for the past 2 months: Single Family Home Expected Discharge Date: 05/29/19                                     Social Determinants of Health (SDOH) Interventions    Readmission Risk Interventions Readmission Risk Prevention Plan 05/26/2019  Transportation Screening Complete  PCP or Specialist Appt within 3-5 Days Complete  HRI or [place] Complete  Social Work Consult for [place] Planning/Counseling Complete  Palliative Care Screening Not Applicable  Medication Review [employer] photographer) Complete  Some recent data might be hidden

## 2019-05-29 NOTE — Progress Notes (Signed)
Physical Therapy Treatment Patient Details Name: Thomas [name] MRN: [id number] DOB: 1934/10/13 Today's Date: 05/29/2019    History of Present Illness Pt admitted for anasarca. HIstory includes heart failure, Aflutter, CAD, and HTN.     PT Comments    Pt is making good progress towards goals. ASL interpreter used, Thomas [name]. Pt in recliner upon arrival and agreeable to ambulate reporting he feels much better this date. Decreased edema noted in B LEs. Able to ambulate in hallway with close chair follow as he does fatigue and needs 2 seated rest breaks due to fatigue. Updated recommendations at this time and discussed with pt and MD. Will continue to progress as able.   Follow Up Recommendations  Home health PT     Equipment Recommendations  None recommended by PT    Recommendations for Other Services       Precautions / Restrictions Precautions Precautions: Fall Restrictions Weight Bearing Restrictions: No    Mobility  Bed Mobility               General bed mobility comments: not performed as he received in recliner  Transfers Overall transfer level: Needs assistance Equipment used: Rolling walker (2 wheeled) Transfers: Sit to/from Stand Sit to Stand: Min guard         General transfer comment: no cues required, able to stand with upright posture.   Ambulation/Gait Ambulation/Gait assistance: Min guard Gait Distance (Feet): 200 Feet Assistive device: Rolling walker (2 wheeled) Gait Pattern/deviations: Step-through pattern     General Gait Details: ambulated in hallway with +2 for safety as pt still somewhat impulsive with gait training. Pt needed 2 sitting rest breaks due to fatigue/LE weakness during mobility efforts. Chair follow closely. Reciprocal gait pattern   Stairs             Wheelchair Mobility    Modified Rankin (Stroke Patients Only)       Balance Overall balance assessment: History of Falls;Needs assistance Sitting-balance support:  Feet supported Sitting balance-Leahy Scale: Good     Standing balance support: Bilateral upper extremity supported Standing balance-Leahy Scale: Good                              Cognition Arousal/Alertness: Awake/alert Behavior During Therapy: WFL for tasks assessed/performed Overall Cognitive Status: Within Functional Limits for tasks assessed                                        Exercises      General Comments        Pertinent Vitals/Pain Pain Assessment: No/denies pain    Home Living                      Prior Function            PT Goals (current goals can now be found in the care plan section) Acute Rehab PT Goals Patient Stated Goal: to go home PT Goal Formulation: With patient Time For Goal Achievement: 06/07/19 Potential to Achieve Goals: Good Progress towards PT goals: Progressing toward goals    Frequency    Min 2X/week      PT Plan Discharge plan needs to be updated    Co-evaluation              AM-PAC PT "6 Clicks" Mobility   Outcome Measure  Help needed turning from your back to your side while in a flat bed without using bedrails?: A Little Help needed moving from lying on your back to sitting on the side of a flat bed without using bedrails?: A Little Help needed moving to and from a bed to a chair (including a wheelchair)?: None Help needed standing up from a chair using your arms (e.g., wheelchair or bedside chair)?: None Help needed to walk in hospital room?: None Help needed climbing 3-5 steps with a railing? : A Little 6 Click Score: 21    End of Session Equipment Utilized During Treatment: Gait belt Activity Tolerance: Patient tolerated treatment well Patient left: in chair;with chair alarm set Nurse Communication: Mobility status PT Visit Diagnosis: Muscle weakness (generalized) (M62.81);Repeated falls (R29.6);Difficulty in walking, not elsewhere classified (R26.2);Pain     Time:  [id number] PT Time Calculation (min) (ACUTE ONLY): 23 min  Charges:  $Gait Training: 23-37 mins                     Thomas [name], [place], DPT [phone]    Thomas [name] 05/29/2019, 12:24 PM

## 2019-05-29 NOTE — Progress Notes (Signed)
Progress Note  Patient Name: Thomas [name] Date of Encounter: 05/29/2019  Primary Cardiologist: Sanda Klein, MD   Subjective   He feels significantly better.  He is almost -20 L for the admission and his weight is down almost 10 kg. I tried to communicate with him with the interpreter but the volume was not working.  Thus, I communicated via writing. I discussed also with Dr. Arbutus Ped.  Inpatient Medications    Scheduled Meds: . sodium chloride   Intravenous Once  . carvedilol  3.125 mg Oral BID WC  . enoxaparin (LOVENOX) injection  40 mg Subcutaneous Q24H  . feeding supplement (ENSURE ENLIVE)  237 mL Oral TID BM  . multivitamin with minerals  1 tablet Oral Daily  . sodium chloride flush  3 mL Intravenous Q12H  . torsemide  40 mg Oral Daily   Continuous Infusions: . sodium chloride Stopped (05/20/19 0542)   PRN Meds: sodium chloride, acetaminophen **OR** acetaminophen, bisacodyl, oxyCODONE, polyethylene glycol, sodium chloride flush   Vital Signs    Vitals:   05/28/19 1937 05/29/19 0255 05/29/19 0438 05/29/19 0721  BP: (!) 104/59  109/65 107/66  Pulse: 62  67 69  Resp: 20  18 18   Temp: 98.1 F (36.7 C)  98.5 F (36.9 C) 97.9 F (36.6 C)  TempSrc: Oral  Oral Oral  SpO2: 94%  94% 94%  Weight:  90.4 kg    Height:        Intake/Output Summary (Last 24 hours) at 05/29/2019 0934 Last data filed at 05/29/2019 0754 Gross per 24 hour  Intake 720 ml  Output 2125 ml  Net -1405 ml   Last 3 Weights 05/29/2019 05/28/2019 05/27/2019  Weight (lbs) 199 lb 3.2 oz 201 lb 201 lb  Weight (kg) 90.357 kg 91.173 kg 91.173 kg      Telemetry    Paced rhythm- Personally Reviewed  ECG    No new tracing observed- Personally Reviewed  Physical Exam   GEN: No acute distress.   Neck:  No JVD Cardiac:  Regular rhythm, systolic murmur, Respiratory:  Decreased breath sounds at bases. few crackles at the right base. GI: Soft, nontender, non-distended  MS: 2+ edema; No  deformity. Neuro:  Nonfocal  Psych: Normal affect   Labs    High Sensitivity Troponin:   Recent Labs  Lab 05/19/19 1553 05/19/19 2047 05/20/19 0054 05/20/19 0637 05/20/19 1321  TROPONINIHS 332* 390* 397* 411* 339*      Chemistry Recent Labs  Lab 05/24/19 0413 05/25/19 0348 05/27/19 0447 05/28/19 0520 05/29/19 0524  NA 142   < > 133* 137 134*  K 3.8   < > 4.2 4.3 4.4  CL 105   < > 98 102 100  CO2 31   < > 27 29 30   GLUCOSE 121*   < > 78 84 92  BUN 35*   < > 27* 26* 25*  CREATININE 1.23   < > 1.38* 1.55* 1.38*  CALCIUM 7.6*   < > 7.8* 8.0* 8.0*  PROT 4.8*  --   --   --   --   ALBUMIN 1.7*  --   --   --   --   AST 43*  --   --   --   --   ALT 27  --   --   --   --   ALKPHOS 117  --   --   --   --   BILITOT 0.7  --   --   --   --  GFRNONAA 54*   < > 47* 41* 47*  GFRAA >60   < > 55* 48* 55*  ANIONGAP 6   < > 8 6 4*   < > = values in this interval not displayed.     Hematology Recent Labs  Lab 05/27/19 0447 05/28/19 0520 05/29/19 0524  WBC 6.6 6.4 6.5  RBC 3.42* 3.48* 3.41*  HGB 7.9* 8.0* 7.9*  HCT 26.6* 26.8* 26.3*  MCV 77.8* 77.0* 77.1*  MCH 23.1* 23.0* 23.2*  MCHC 29.7* 29.9* 30.0  RDW 20.8* 21.2* 21.1*  PLT 328 323 314    BNPNo results for input(s): BNP, PROBNP in the last 168 hours.   DDimer No results for input(s): DDIMER in the last 168 hours.   Radiology    No results found.  Cardiac Studies   Echocardiogram 05/21/19 . Left ventricular ejection fraction, by estimation, is 35 to 40%. The  left ventricle has moderately decreased function. The left ventricle  demonstrates global hypokinesis. The left ventricular internal cavity size  was moderately to severely dilated.  There is mild left ventricular hypertrophy. Left ventricular diastolic  parameters are indeterminate. There is akinesis of the left ventricular,  basal inferior segment.  2. Right ventricular systolic function is mildly reduced. The right  ventricular size is mildly  enlarged. There is moderately elevated  pulmonary artery systolic pressure.  3. Left atrial size was severely dilated.  4. Right atrial size was moderately dilated.  5. Moderate pleural effusion in the left lateral region.  6. The mitral valve is degenerative. Mild to moderate mitral valve  regurgitation.  7. Tricuspid valve regurgitation is severe.  8. The aortic valve has an indeterminant number of cusps. Aortic valve  regurgitation is mild. Mild aortic valve sclerosis is present, with no  evidence of aortic valve stenosis.  9. Aortic dilatation noted. There is mild dilatation of the aortic root  and of the ascending aorta measuring 43 mm.  10. The inferior vena cava is dilated in size with <50% respiratory  variability, suggesting right atrial pressure of 15 mmHg.  Patient Profile     85 y.o. male 85 year old gentleman with history of CAD status post CABG, PCI, heart failure reduced ejection fraction last EF 35 to 40%, status post ICD 2014, permanent A. fib not on anticoagulation due to GI bleed, deafness who is being seen for volume overload  Assessment & Plan    1.  Acute on chronic systolic heart failure: Ejection fraction of 35 to 40%. Significant improvement in leg edema since last week and he is almost down 10 kg for the admission.  Although he continues to have pitting edema, I do not think we will be able to dry him out completely especially in the setting of chronicity as well as very low albumin.  As a matter fact, furosemide has been held the last few doses due to low blood pressure. -Still with 2+ edema in the lower extremity -I think his volume status has improved significantly.  I switched him from IV furosemide to torsemide 40 mg once daily. Continue small dose carvedilol. Not on an ACE inhibitor or ARB due to hypotension. The patient otherwise can be discharged from a cardiac standpoint if no other issues but might need reevaluation by physical therapy.  2.   History of CAD status post CABG, PCI -Currently with no chest pain -Continue Coreg and statin.  History of GI bleed prevents anticoagulant usage.  3.  Permanent A. Fib -Heart rate controlled.  Currently  paced rhythm. -Not on anticoagulation due to history of GI bleeds, known AVM.[name]      Signed, Kathlyn [place], MD  05/29/2019, 9:34 AM

## 2019-05-30 LAB — BPAM RBC
Blood Product Expiration Date: 202103172359
Blood Product Expiration Date: 202104042359
ISSUE DATE / TIME: 202103111803
Unit Type and Rh: 5100
Unit Type and Rh: 5100

## 2019-05-30 LAB — BASIC METABOLIC PANEL
Anion gap: 5 (ref 5–15)
BUN: 25 mg/dL — ABNORMAL HIGH (ref 8–23)
CO2: 31 mmol/L (ref 22–32)
Calcium: 8 mg/dL — ABNORMAL LOW (ref 8.9–10.3)
Chloride: 103 mmol/L (ref 98–111)
Creatinine, Ser: 1.3 mg/dL — ABNORMAL HIGH (ref 0.61–1.24)
GFR calc Af Amer: 59 mL/min — ABNORMAL LOW (ref >60)
GFR calc non Af Amer: 51 mL/min — ABNORMAL LOW (ref >60)
Glucose, Bld: 93 mg/dL (ref 70–99)
Potassium: 4.4 mmol/L (ref 3.5–5.1)
Sodium: 139 mmol/L (ref 135–145)

## 2019-05-30 LAB — TYPE AND SCREEN
ABO/RH(D): O POS
Antibody Screen: NEGATIVE
Donor AG Type: NEGATIVE
PT AG Type: POSITIVE
Unit division: 0
Unit division: 0

## 2019-05-30 LAB — CBC
HCT: 26.6 % — ABNORMAL LOW (ref 39.0–52.0)
Hemoglobin: 7.8 g/dL — ABNORMAL LOW (ref 13.0–17.0)
MCH: 22.8 pg — ABNORMAL LOW (ref 26.0–34.0)
MCHC: 29.3 g/dL — ABNORMAL LOW (ref 30.0–36.0)
MCV: 77.8 fL — ABNORMAL LOW (ref 80.0–100.0)
Platelets: 297 10*3/uL (ref 150–400)
RBC: 3.42 MIL/uL — ABNORMAL LOW (ref 4.22–5.81)
RDW: 21 % — ABNORMAL HIGH (ref 11.5–15.5)
WBC: 6.5 10*3/uL (ref 4.0–10.5)
nRBC: 0 % (ref 0.0–0.2)

## 2019-05-30 LAB — MAGNESIUM: Magnesium: 2.5 mg/dL — ABNORMAL HIGH (ref 1.7–2.4)

## 2019-05-30 NOTE — Plan of Care (Signed)
  Problem: Clinical Measurements: Goal: Ability to maintain clinical measurements within normal limits will improve Outcome: Adequate for Discharge Goal: Will remain free from infection Outcome: Adequate for Discharge Goal: Diagnostic test results will improve Outcome: Adequate for Discharge   Problem: Pain Managment: Goal: General experience of comfort will improve Outcome: Adequate for Discharge   Problem: Safety: Goal: Ability to remain free from injury will improve Outcome: Adequate for Discharge   Problem: Skin Integrity: Goal: Risk for impaired skin integrity will decrease Outcome: Adequate for Discharge   Problem: Education: Goal: Ability to demonstrate management of disease process will improve Outcome: Adequate for Discharge Goal: Ability to verbalize understanding of medication therapies will improve Outcome: Adequate for Discharge Goal: Individualized Educational Video(s) Outcome: Adequate for Discharge   Problem: Activity: Goal: Capacity to carry out activities will improve Outcome: Adequate for Discharge   Problem: Cardiac: Goal: Ability to achieve and maintain adequate cardiopulmonary perfusion will improve Outcome: Adequate for Discharge

## 2019-05-30 NOTE — Progress Notes (Signed)
The patient's wife called stating that she thought the patient would be leaving on Tuesday. She is working from 7am-12pm. She stated that she will come to the hospital after work on Tuesday for discharge instructions.

## 2019-05-30 NOTE — Discharge Instructions (Signed)
Check your blood pressure in the morning BEFORE you take your medications. If the BP less than 110/60 - meaning the top number is less than 110 OR the bottom number is less than 60, then do not take carvedilol or torsemide.  In the evening before taking carvedilol, check your blood pressure again and do not take if BP less than 110/60

## 2019-05-30 NOTE — Progress Notes (Signed)
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 Covid is negative. BNP is elevated 638. Work-up has revealed a cirrhotic liver. He is getting diuresed with daily weights and I's and O's.Cardiology consulting.  Subjective 3/14: Patient seen and examined at bedside this morning with ASL virtual interpreter's assistance. No acute is reported overnight. Reports he feels really good today.  Agrees with going home, says he is ready.  Denies fevers or chills, chest pain or shortness of breath, nausea vomiting diarrhea or other acute complaints.  Assessment & Plan:   Principal Problem:   Anasarca Active Problems:   CAD Status post coronary artery bypass grafting: 1995   Permanent atrial fibrillation (HCC)   Chronic combined systolic and diastolic heart failure (HCC)   Benign essential HTN   CAD S/P multiple PCIs   Elevated troponin   Acute on chronic systolic CHF (congestive heart failure) (HCC)   CRI (chronic renal insufficiency), stage 3 (moderate)   Decompensated hepatic cirrhosis (HCC)   Leg swelling   Acute decompensation ofcombined systolic/diastolicCHF Anasarca Starling forces apparently improved with diuresis, creatininecontinues to improve.Has moderate pleural effusions bilaterally, will defer thoracentesis for now and continue diuresis.Net fluid balance -13 L to date (unclear if inputs are accurate). Lower extremity edema greatly improved. Lower extremity duplex U/S ruled out DVT bilaterally. --Cardiology following --Decrease Lasix to 40 mg IV BID, given creatinine slightly worse --continue Coreg, hold for soft BP or  bradycardia --spironolactone & losartan held due to borderline blood pressures --daily weights & strick I/O's --monitor renal function closely, risk for cardiorenal syndrome with aggressive diuresis  Cirrhosis Patient with early cirrhosis on ultrasound, without ascites seen. --continue diuresis with Lasix  --hold Aldactone due to soft BP's with diuresis --monitor potassium, replace as needed for K>4.0  Acute on Chronic Microcytic Anemia - baseline Hbg appears to be 7.9-8.6, now in low 7's. Has known GI AVM's, so likely some chronic blood loss. Patient with SOB worse than baseline recently, and persistent despite diuresis, concern for symptomatic anemia contributing.  Transfused 1 unit pRBC's on 3/11. --monitor CBC --transfuse if Hbg < 7.0  "Black tarry stools" per nursing report- resolved History of GI bleed Patient hemodynamically stable,H&H stable, butdoes have apriorhistory of GI bleed. --monitor H&H --consider GI consult if further drop in Hbg or ongoing bleeding --recommend outpatient GI follow up --avoidallNSAIDs and antiplatelets  Acutekidney injury superimposed onCKDstage IIIa Creatinine improved with diuresis given likely improvement in Starling forces.  Will need to avoid hepatorenal syndrome so diuresis must be judicious. --monitor renal function and electrolytes  Liver nodule Discussed liver nodule with interventional radiology, they do not think this is a particularly concerningfinding, possibly over-read. Recommendation is for noncontrasted MRI to be done after patient is adequately diuresed (so he can lie still and flat during MRI) to see if this nodule is in fact a nodule or just part of the heterogenous architecture of his liver.  Cough with recent Covid exposure-improving with diuresis. COVID-19 PCR negative. Chest CT without infiltrates. Procalcitonin was mildly elevated but patient afebrile and without leukocytosis or other clinical signs  of infection. Empiric antibiotics started on admission discontinued. --Monitor for signs symptoms of infection --Follow CBC  Elevated troponin-secondary to demand  ischemia per cardiology. Troponin peaked at 411 andtrended down. Patient without chest pain.  Atrial fibrillation-with biventricular pacemaker.Not on anticoagulation presumably due to history of recurrent GI bleeding. Rate controlled without medication. --Monitor  Essential hypertension-currently with borderline soft blood pressures --Hold antihypertensives    DVT prophylaxis: Lovenox   Code Status: Full Code  Family Communication: none at bedside during [place]   Disposition Plan:  was actually discharged home today, but unable to reach wife to notify her to pick him up.  Discharge tomorrow when transportation and wife ready. Coming From home Exp DC Date 3/16 Barriers unable to reach wife re discharge Medically Stable for Discharge? yes  Consultants:   Cardiology  Antimicrobials:    Rocephin & Azithromycin empirically on admission, discontinued after 1 day  Objective: Vitals:   05/30/19 0456 05/30/19 0729 05/30/19 1121 05/30/19 1604  BP: 97/61 101/60 104/66 (!) 106/58  Pulse: 62 (!) 59 65 60  Resp:  18 18 16   Temp: 97.7 F (36.5 C) 98.4 F (36.9 C) 98.4 F (36.9 C) (!) 97.5 F (36.4 C)  TempSrc: Oral Oral Oral Oral  SpO2: 92% 95% 94% 95%  Weight: 90.6 kg     Height:       No intake or output data in the 24 hours ending 06/08/19 1601 Filed Weights   05/28/19 0634 05/29/19 0255 05/30/19 0456  Weight: 91.2 kg 90.4 kg 90.6 kg    Examination:  General exam: awake, alert, no acute distress HEENT: moist mucus membranes, deaf Respiratory system: CTAB, normal respiratory effort. Cardiovascular system: normal S1/S2, RRR, lower extremity edema much better.   Extremities: moves all, normal tone, no cyanosis, lower extremity venous stasis changes left worse than right Skin: Dry,  intact, normal temperature and color Psychiatry: Normal mood, congruent affect  Data Reviewed: I have personally reviewed following labs and imaging studies  CBC: No results for input(s): WBC, NEUTROABS, HGB, HCT, MCV, PLT in the last 168 hours. Basic Metabolic Panel: No results for input(s): NA, K, CL, CO2, GLUCOSE, BUN, CREATININE, CALCIUM, MG, PHOS in the last 168 hours. GFR: Estimated Creatinine Clearance: 50.9 mL/min (A) (by C-G formula based on SCr of 1.3 mg/dL (H)). Liver Function Tests: No results for input(s): AST, ALT, ALKPHOS, BILITOT, PROT, ALBUMIN in the last 168 hours. No results for input(s): LIPASE, AMYLASE in the last 168 hours. No results for input(s): AMMONIA in the last 168 hours. Coagulation Profile: No results for input(s): INR, PROTIME in the last 168 hours. Cardiac Enzymes: No results for input(s): CKTOTAL, CKMB, CKMBINDEX, TROPONINI in the last 168 hours. BNP (last 3 results) No results for input(s): PROBNP in the last 8760 hours. HbA1C: No results for input(s): HGBA1C in the last 72 hours. CBG: No results for input(s): GLUCAP in the last 168 hours. Lipid Profile: No results for input(s): CHOL, HDL, LDLCALC, TRIG, CHOLHDL, LDLDIRECT in the last 72 hours. Thyroid Function Tests: No results for input(s): TSH, T4TOTAL, FREET4, T3FREE, THYROIDAB in the last 72 hours. Anemia Panel: No results for input(s): VITAMINB12, FOLATE, FERRITIN, TIBC, IRON, RETICCTPCT in the last 72 hours. Sepsis Labs: No results for input(s): PROCALCITON, LATICACIDVEN in the last 168 hours.  No results found for this or any previous visit (from the past 240 hour(s)).       Radiology Studies: No results found.      Scheduled Meds:  Continuous Infusions:    LOS: 11 days    Time spent: 30 minutes    Ezekiel Slocumb, DO [facility]  If 7PM-7AM, please contact night-coverage [email or web address] 06/08/2019, 4:01 PM

## 2019-05-30 NOTE — Care Management Important Message (Signed)
Important Message  Patient Details  Name: Thomas [name] MRN: [id number] Date of Birth: 1935-02-04   Medicare Important Message Given:  Yes     Dannette Barbara 05/30/2019, 10:51 AM

## 2019-05-30 NOTE — Progress Notes (Signed)
Discharge instructions gone over with patient and wife using ASL on interpreter device. All questions answered appropriately. Holding parameters gone over for carvedilol and patient verbalized having blood pressure device at home. Patient and wife verbalized understanding. Wife taking patient home. IV taken out and tele monitor off.

## 2019-05-30 NOTE — Progress Notes (Signed)
Nutrition Follow Up Note  DOCUMENTATION CODES:   Not applicable  INTERVENTION:   Ensure Enlive po TID, each supplement provides 350 kcal and 20 grams of protein  MVI daily  Recommend continue ferrous sulfate 325mg  po daily  NUTRITION DIAGNOSIS:   Inadequate oral intake related to lethargy/confusion as evidenced by other (comment)(per RN report).  GOAL:   Patient will meet greater than or equal to 90% of their needs -progressing   MONITOR:   PO intake, Supplement acceptance, Weight trends, I & O's, Labs  ASSESSMENT:   Pt is deaf with a PMH significant for HTN, CAD s/p CABG with multiple stens, CHF, permanent A. Fib, biventricular pacemaker, lymphedema presented with lower extremity swelling and orthopnea. Pt admitted with anasarca.   Pt with improved appetite and oral intake; pt eating anywhere from sips and bites of meals to 100%. Pt is drinking some Ensure supplements. Anasarca improving. Per chart, pt is down ~10lbs since admit. Edema significantly better. Pt -14L on his I & Os.  Of note, pt with microcytic anemia; pt with h/o iron deficiency. Pt takes ferrous sulfate 325mg  po daily and is followed by PCP for this.   Medications reviewed and include: lovenox, lasix, MVI, torsemide  Labs reviewed: BUN 25(H), creat 1.30(H) Hgb 7.8(L), Hct 26.6(L), MCV 77.8(L), MCH 22.8(L), MCHC 29.3(L) Iron 22(L), TIBC 356, ferritin 21(L)- 05/2018  Diet Order:   Diet Order            Diet - low sodium heart healthy        Diet 2 gram sodium Room service appropriate? Yes; Fluid consistency: Thin  Diet effective now             EDUCATION NEEDS:   Not appropriate for education at this time  Skin:  Skin Assessment: Skin Integrity Issues: Skin Integrity Issues:: Other (Comment) Other: closed blisters on bilateral legs  Last BM:  3/15- type 4  Height:   Ht Readings from Last 1 Encounters:  05/19/19 6\' 2"  (1.88 m)    Weight:   Wt Readings from Last 10 Encounters:   05/30/19 90.6 kg  01/30/19 103 kg  11/07/18 108.5 kg  10/27/18 109.1 kg  10/11/18 105.2 kg  09/16/18 102.7 kg  09/05/18 104.9 kg  08/29/18 105 kg  08/17/18 100.3 kg  07/20/18 107 kg    BMI:  Body mass index is 25.65 kg/m.  Estimated Nutritional Needs:   Kcal:  2200-2500kcal/day  Protein:  110-125 grams  Fluid:  >2.2L/day  Koleen Distance MS, RD, LDN Contact information available in Amion

## 2019-06-02 ENCOUNTER — Telehealth: Payer: Self-pay | Admitting: Family

## 2019-06-02 NOTE — Telephone Encounter (Signed)
LVM with patient to give [place] a call back to confirm his follow up appointment with the [facility] after his recent hospital d/c and to see how patient is doing.   Alyse Low, [place]

## 2019-06-03 NOTE — Progress Notes (Signed)
Patient ID: Thomas [name], male       DOB: 10/02/2034, 85 y.o.      MRN [id number]  Entire visit was done with video interpreting services through [employer] (as patient showed up >30 minutes late for appointment)   HPI  Thomas [name] is a 85 y/o male with a history of CAD,HTN, CKD, PAF, peptic ulcer, bilateral deafness, previous tobacco use and chronic heart failure.   Echo report from 05/21/19 reviewed and showed an EF of 35-40% along with moderately elevated PA pressure, mild/moderate Thomas and severe TR. Echo report from 05/02/2018 reviewed and showed an EF of 35-40% along with mild/ moderate Thomas, mild AR and moderate pulmonary HTN.Echo report from 11/25/15 reviewed and showed an EF of 45-50% along with mild AR, moderate Thomas and mild-moderate TR.   Admitted 05/19/19 due to acute on chronic HF. Cardiology consult obtained. Initially given IV lasix and then transitioned to oral diuretics. Diuretic was held for a few days due to hypotension. Down ~10 kg. COVID negative. Diagnosed with liver cirrhosis. Ultrasound ruled out DVT. Given 1 unit of blood due to anemia. Elevated troponin thought to be due to demand ischemia. Discharged after 16 days.   He presents today for a follow-up visit with a chief complaint of minimal shortness upon moderate exertion. He describes this as chronic in nature having been present for several years but has improved since recent hospital admission. He has associated fatigue, pedal edema, palpitations and difficulty sleeping along with this. He denies any dizziness, abdominal distention, chest pain or cough.   Hasn't been weighing himself daily but does have working scales at home. Has worn compression socks in the past but they need to get a bigger size. Not elevating his legs much either.   Past Medical History:  Diagnosis Date  . AICD (automatic cardioverter/defibrillator) present   . Biventricular ICD (implantable cardioverter-defibrillator) in place 03/24/2005   Implantation of a  [employer] Adapta ADDRO1, serial number [id number]  . CHF (congestive heart failure) ([place])   . CKD (chronic kidney disease), stage III   . Coronary artery disease    a. s/p CABG 1986. b. Multiple PCIs/caths. c. 09/2013: s/p PTCA and BMS to SVG-OM.  [name]   . Dysrhythmia   . History of abdominal aortic aneurysm   . History of bleeding peptic ulcer 1980  . History of epididymitis 2013  . HTN (hypertension)   . Hydronephrosis with ureteropelvic junction obstruction   . Hydroureter on left 2009  . Hypertension   . Ischemic cardiomyopathy    a. Prior EF 30-35%, s/p BIV-ICD. b. 09/2013: EF 45-50%.  . Moderate tricuspid regurgitation   . PAF (paroxysmal atrial fibrillation) ([place])   . Presence of permanent cardiac pacemaker   . Prostate cancer ([place])   . Status post coronary artery bypass grafting 1986   LIMA to the LAD, SVG to OM, SVG to RCA  . Testicular swelling    Past Surgical History:  Procedure Laterality Date  . 2-D echocardiogram  11/20/2011   Ejection fraction 30-35% moderate concentric left ventricular hypertrophy. Left atrium is moderately dilated. Mild Thomas. Mild or  . BI-VENTRICULAR IMPLANTABLE CARDIOVERTER DEFIBRILLATOR N/A 12/16/2012   Procedure: BI-VENTRICULAR IMPLANTABLE CARDIOVERTER DEFIBRILLATOR  (CRT-D);  Surgeon: [name], MD;  Location: [facility] CATH LAB;  Service: Cardiovascular;  Laterality: N/A;  . CARDIAC CATHETERIZATION  12/10/2011   SVG to OM widely patent.  LIMA to LAD patent  . CATARACT EXTRACTION W/PHACO Right 10/12/2017   Procedure: CATARACT EXTRACTION PHACO  AND INTRAOCULAR LENS PLACEMENT (IOC);  Surgeon: Birder Robson, MD;  Location: [facility] ORS;  Service: Ophthalmology;  Laterality: Right;  [place] 00:57 AP% 15.9 CDE 9.07 Fluid pack lot # [id number]  . COLONOSCOPY N/A 07/13/2018   Procedure: COLONOSCOPY;  Surgeon: Toledo, Benay Pike, MD;  Location: [facility] ENDOSCOPY;  Service: Gastroenterology;  Laterality: N/A;  . CORONARY ARTERY BYPASS GRAFT  1986  . ENTEROSCOPY N/A  09/14/2018   Procedure: ENTEROSCOPY;  Surgeon: Toledo, Benay Pike, MD;  Location: [facility] ENDOSCOPY;  Service: Gastroenterology;  Laterality: N/A;  symptomatic anemia, GI blood loss anemia, melena, positive small bowel capsule endoscopy showing source of bleeding   . ESOPHAGOGASTRODUODENOSCOPY N/A 07/13/2018   Procedure: ESOPHAGOGASTRODUODENOSCOPY (EGD);  Surgeon: Toledo, Benay Pike, MD;  Location: [facility] ENDOSCOPY;  Service: Gastroenterology;  Laterality: N/A;  . ESOPHAGOGASTRODUODENOSCOPY N/A 09/14/2018   Procedure: ESOPHAGOGASTRODUODENOSCOPY (EGD);  Surgeon: Toledo, Benay Pike, MD;  Location: [facility] ENDOSCOPY;  Service: Gastroenterology;  Laterality: N/A;  SIGN LANAGUAGE INTERPRETER  . ESOPHAGOGASTRODUODENOSCOPY (EGD) WITH PROPOFOL N/A 05/27/2018   Procedure: ESOPHAGOGASTRODUODENOSCOPY (EGD) WITH PROPOFOL;  Surgeon: Clarene Essex, MD;  Location: [place];  Service: Endoscopy;  Laterality: N/A;  . INSERT / REPLACE / REMOVE PACEMAKER    . LEFT HEART CATHETERIZATION WITH CORONARY/GRAFT ANGIOGRAM N/A 12/10/2011   Procedure: LEFT HEART CATHETERIZATION WITH Beatrix Fetters;  Surgeon: Sanda Klein, MD;  Location: [place] CATH LAB;  Service: Cardiovascular;  Laterality: N/A;  . LEFT HEART CATHETERIZATION WITH CORONARY/GRAFT ANGIOGRAM N/A 09/25/2013   Procedure: LEFT HEART CATHETERIZATION WITH Beatrix Fetters;  Surgeon: Blane Ohara, MD;  Location: [facility] CATH LAB;  Service: Cardiovascular;  Laterality: N/A;  . Persantine Myoview  05/06/2010   Post-rest ejection fraction 30%. No significant ischemia demonstrated. Compared to previous study there is no significant change.  . TRANSURETHRAL RESECTION OF PROSTATE     s/p   Family History  Problem Relation Age of Onset  . Hypertension Father    Social History   Tobacco Use  . Smoking status: Former Smoker    Quit date: 03/15/1985    Years since quitting: 34.2  . Smokeless tobacco: Never Used  Substance Use Topics  . Alcohol use: No    Comment:  occas.   Allergies  Allergen Reactions  . Entresto [Sacubitril-Valsartan] Swelling    And bruising of arm  . Phenazopyridine Nausea Only and Other (See Comments)    GI UPSET  . Ramipril Other (See Comments)    unk Other reaction(s): Other (See Comments), Unknown unk   Prior to Admission medications   Medication Sig Start Date End Date Taking? Authorizing Provider  albuterol (VENTOLIN HFA) 108 (90 Base) MCG/ACT inhaler Inhale 2 puffs into the lungs 4 (four) times daily as needed. Patient taking differently: Inhale 2 puffs into the lungs 4 (four) times daily as needed for wheezing or shortness of breath.  08/02/18  Yes Croitoru, Mihai, MD  carvedilol (COREG) 3.125 MG tablet Take 1 tablet (3.125 mg total) by mouth 2 (two) times daily with a meal. Patient taking differently: Take 12.5 mg by mouth daily.  08/17/18  Yes Lavina Hamman, MD  [employer] ACETAMINOPHEN EX ST 500 MG tablet Take 500-1,000 mg by mouth every 6 (six) hours as needed for pain or fever. 04/26/19  Yes [provider]  feeding supplement, ENSURE ENLIVE, (ENSURE ENLIVE) LIQD Take 237 mLs by mouth 3 (three) times daily between meals. 05/29/19  Yes Nicole Kindred A, DO  ferrous sulfate 325 (65 FE) MG tablet Take 1 tablet (325 mg total) by mouth daily  for 30 days. 05/28/18  Yes Elodia Florence., MD  Multiple Vitamin (MULTIVITAMIN WITH MINERALS) TABS tablet Take 1 tablet by mouth daily. 05/29/19  Yes Nicole Kindred A, DO  pantoprazole (PROTONIX) 40 MG tablet Take 1 tablet (40 mg total) by mouth 2 (two) times daily before a meal. Patient taking differently: Take 40 mg by mouth daily.  09/16/18  Yes Demetrios Loll, MD  simvastatin (ZOCOR) 20 MG tablet TAKE 1 TABLET BY MOUTH EVERY DAY Patient taking differently: Take 20 mg by mouth daily.  04/07/19  Yes Kilroy, Luke K, PA-C  torsemide (DEMADEX) 20 MG tablet Take 2 tablets (40 mg total) by mouth daily. 05/30/19  Yes Nicole Kindred A, DO  triamcinolone cream (KENALOG) 0.1 % Apply 1  application topically 2 (two) times daily as needed for itching. (avoid face, groin and axilla) 04/17/19  Yes [provider]  vitamin B-12 (CYANOCOBALAMIN) 500 MCG tablet Take 1 tablet (500 mcg total) by mouth daily. 08/18/18  Yes Maisie Fus, MD      Review of Systems  Constitutional: Positive for fatigue. Negative for appetite change.  HENT: Positive for hearing loss. Negative for congestion, rhinorrhea and sore throat.   Eyes: Negative.   Respiratory: Positive for shortness of breath (minimal). Negative for cough and chest tightness.   Cardiovascular: Positive for palpitations and leg swelling. Negative for chest pain.  Gastrointestinal: Negative for abdominal distention and abdominal pain.  Endocrine: Negative.   Genitourinary: Negative.   Musculoskeletal: Negative for arthralgias and back pain.  Skin: Negative.   Allergic/Immunologic: Negative.   Neurological: Negative for dizziness and light-headedness.  Hematological: Negative for adenopathy. Bruises/bleeds easily.  Psychiatric/Behavioral: Positive for sleep disturbance (sleeping in recliner). Negative for dysphoric mood. The patient is not nervous/anxious.    Vitals:   06/05/19 1110  BP: 112/62  Pulse: 74  Resp: 18  SpO2: 99%  Weight: 201 lb (91.2 kg)  Height: 6\' 2"  (1.88 m)   Wt Readings from Last 3 Encounters:  06/05/19 201 lb (91.2 kg)  05/30/19 199 lb 12.8 oz (90.6 kg)  01/30/19 227 lb (103 kg)   Lab Results  Component Value Date   CREATININE 1.30 (H) 05/30/2019   CREATININE 1.38 (H) 05/29/2019   CREATININE 1.55 (H) 05/28/2019     Physical Exam Vitals and nursing note reviewed.  Constitutional:      Appearance: He is well-developed.  HENT:     Head: Normocephalic and atraumatic.     Ears:     Comments: Completely deaf in both ears Cardiovascular:     Rate and Rhythm: Normal rate and regular rhythm.  Pulmonary:     Effort: Pulmonary effort is normal.     Breath sounds: Normal breath sounds.  No rhonchi or rales.  Abdominal:     Palpations: Abdomen is soft.     Tenderness: There is no abdominal tenderness.  Musculoskeletal:     Cervical back: Normal range of motion and neck supple.     Right lower leg: No tenderness. Edema (2+ pitting) present.     Left lower leg: No tenderness. Edema (2+ pitting) present.  Skin:    General: Skin is warm and dry.  Neurological:     Mental Status: He is alert and oriented to person, place, and time.     Motor: No weakness.  Psychiatric:        Mood and Affect: Mood normal.        Behavior: Behavior normal.    Assessment & Plan:  1: Chronic heart failure with reduced ejection fraction- - NYHA class II - euvolemic today - not weighing daily and he was instructed to resume weighing daily and write the weight down so that he can call for an overnight weight gain of >2 pounds or a weekly weight gain of >5 pounds - weight down 38 pounds from last visit here 7 months ago - not adding salt and has been reading food labels. Reviewed the importance of closely following a 2000mg  sodium diet  - drinks 1 cup of coffee and ~ 48 ounces of water daily - had telemedicine visit with cardiology Rosalyn Gess) 01/30/2019 - has had side effect from both entresto and ramipril in the past - has ICD and saw EP [name]) 04/10/19 - BNP 05/19/19 was 633.0  2: HTN- - BP looks good today - saw PCP Fulton Mole) 05/19/19 - BMP from 05/30/19 reviewed and showed sodium 139, potassium 4.4, creatinine 1.3 and GFR 51  3: Lymphedema- - stage 2 - hasn't been elevating his legs much and he was instructed to elevate his legs when he's sitting for long periods of time - has worn compression socks in the past but feels like he needs a bigger size; they will stop by the same place they got previous ones and see if they can get him a larger size - will ask about compression boots if edema persists as referral had been made previously   Medication list was reviewed.   Return in 2 months  or sooner for any questions/problems before then.    Entire visit was done with video interpreting services through [employer]

## 2019-06-05 ENCOUNTER — Ambulatory Visit: Payer: PPO | Attending: Family | Admitting: Family

## 2019-06-05 ENCOUNTER — Other Ambulatory Visit: Payer: Self-pay

## 2019-06-05 ENCOUNTER — Encounter: Payer: Self-pay | Admitting: Family

## 2019-06-05 VITALS — BP 112/62 | HR 74 | Resp 18 | Ht 74.0 in | Wt 201.0 lb

## 2019-06-05 DIAGNOSIS — Z8679 Personal history of other diseases of the circulatory system: Secondary | ICD-10-CM | POA: Insufficient documentation

## 2019-06-05 DIAGNOSIS — G479 Sleep disorder, unspecified: Secondary | ICD-10-CM | POA: Insufficient documentation

## 2019-06-05 DIAGNOSIS — I255 Ischemic cardiomyopathy: Secondary | ICD-10-CM | POA: Insufficient documentation

## 2019-06-05 DIAGNOSIS — I480 Paroxysmal atrial fibrillation: Secondary | ICD-10-CM | POA: Diagnosis not present

## 2019-06-05 DIAGNOSIS — K7460 Unspecified cirrhosis of liver: Secondary | ICD-10-CM | POA: Insufficient documentation

## 2019-06-05 DIAGNOSIS — I5022 Chronic systolic (congestive) heart failure: Secondary | ICD-10-CM

## 2019-06-05 DIAGNOSIS — R0602 Shortness of breath: Secondary | ICD-10-CM | POA: Insufficient documentation

## 2019-06-05 DIAGNOSIS — Z950 Presence of cardiac pacemaker: Secondary | ICD-10-CM | POA: Insufficient documentation

## 2019-06-05 DIAGNOSIS — R002 Palpitations: Secondary | ICD-10-CM | POA: Insufficient documentation

## 2019-06-05 DIAGNOSIS — I890 Lymphedema, not elsewhere classified: Secondary | ICD-10-CM | POA: Diagnosis not present

## 2019-06-05 DIAGNOSIS — I272 Pulmonary hypertension, unspecified: Secondary | ICD-10-CM | POA: Insufficient documentation

## 2019-06-05 DIAGNOSIS — Z79899 Other long term (current) drug therapy: Secondary | ICD-10-CM | POA: Diagnosis not present

## 2019-06-05 DIAGNOSIS — Z87891 Personal history of nicotine dependence: Secondary | ICD-10-CM | POA: Diagnosis not present

## 2019-06-05 DIAGNOSIS — I10 Essential (primary) hypertension: Secondary | ICD-10-CM

## 2019-06-05 DIAGNOSIS — Z7901 Long term (current) use of anticoagulants: Secondary | ICD-10-CM | POA: Insufficient documentation

## 2019-06-05 DIAGNOSIS — N183 Chronic kidney disease, stage 3 unspecified: Secondary | ICD-10-CM | POA: Diagnosis not present

## 2019-06-05 DIAGNOSIS — Z8249 Family history of ischemic heart disease and other diseases of the circulatory system: Secondary | ICD-10-CM | POA: Diagnosis not present

## 2019-06-05 DIAGNOSIS — I130 Hypertensive heart and chronic kidney disease with heart failure and stage 1 through stage 4 chronic kidney disease, or unspecified chronic kidney disease: Secondary | ICD-10-CM | POA: Insufficient documentation

## 2019-06-05 DIAGNOSIS — I2510 Atherosclerotic heart disease of native coronary artery without angina pectoris: Secondary | ICD-10-CM | POA: Diagnosis not present

## 2019-06-05 DIAGNOSIS — D649 Anemia, unspecified: Secondary | ICD-10-CM | POA: Diagnosis not present

## 2019-06-05 DIAGNOSIS — Z951 Presence of aortocoronary bypass graft: Secondary | ICD-10-CM | POA: Diagnosis not present

## 2019-06-05 NOTE — Patient Instructions (Addendum)
Resume weighing daily and call for an overnight weight gain of > 2 pounds or a weekly weight gain of >5 pounds. 

## 2019-06-07 DIAGNOSIS — Z9181 History of falling: Secondary | ICD-10-CM | POA: Diagnosis not present

## 2019-06-07 DIAGNOSIS — Z87891 Personal history of nicotine dependence: Secondary | ICD-10-CM | POA: Diagnosis not present

## 2019-06-07 DIAGNOSIS — Z7951 Long term (current) use of inhaled steroids: Secondary | ICD-10-CM | POA: Diagnosis not present

## 2019-06-07 DIAGNOSIS — I252 Old myocardial infarction: Secondary | ICD-10-CM | POA: Diagnosis not present

## 2019-06-07 DIAGNOSIS — E7800 Pure hypercholesterolemia, unspecified: Secondary | ICD-10-CM | POA: Diagnosis not present

## 2019-06-07 DIAGNOSIS — I2510 Atherosclerotic heart disease of native coronary artery without angina pectoris: Secondary | ICD-10-CM | POA: Diagnosis not present

## 2019-06-07 DIAGNOSIS — N183 Chronic kidney disease, stage 3 unspecified: Secondary | ICD-10-CM | POA: Diagnosis not present

## 2019-06-07 DIAGNOSIS — I4821 Permanent atrial fibrillation: Secondary | ICD-10-CM | POA: Diagnosis not present

## 2019-06-07 DIAGNOSIS — I890 Lymphedema, not elsewhere classified: Secondary | ICD-10-CM | POA: Diagnosis not present

## 2019-06-07 DIAGNOSIS — I255 Ischemic cardiomyopathy: Secondary | ICD-10-CM | POA: Diagnosis not present

## 2019-06-07 DIAGNOSIS — D631 Anemia in chronic kidney disease: Secondary | ICD-10-CM | POA: Diagnosis not present

## 2019-06-07 DIAGNOSIS — I110 Hypertensive heart disease with heart failure: Secondary | ICD-10-CM | POA: Diagnosis not present

## 2019-06-07 DIAGNOSIS — Z8546 Personal history of malignant neoplasm of prostate: Secondary | ICD-10-CM | POA: Diagnosis not present

## 2019-06-07 DIAGNOSIS — I441 Atrioventricular block, second degree: Secondary | ICD-10-CM | POA: Diagnosis not present

## 2019-06-07 DIAGNOSIS — H9193 Unspecified hearing loss, bilateral: Secondary | ICD-10-CM | POA: Diagnosis not present

## 2019-06-07 DIAGNOSIS — I071 Rheumatic tricuspid insufficiency: Secondary | ICD-10-CM | POA: Diagnosis not present

## 2019-06-07 DIAGNOSIS — I5042 Chronic combined systolic (congestive) and diastolic (congestive) heart failure: Secondary | ICD-10-CM | POA: Diagnosis not present

## 2019-06-07 DIAGNOSIS — I499 Cardiac arrhythmia, unspecified: Secondary | ICD-10-CM | POA: Diagnosis not present

## 2019-06-07 DIAGNOSIS — K219 Gastro-esophageal reflux disease without esophagitis: Secondary | ICD-10-CM | POA: Diagnosis not present

## 2019-06-07 DIAGNOSIS — Z951 Presence of aortocoronary bypass graft: Secondary | ICD-10-CM | POA: Diagnosis not present

## 2019-06-07 DIAGNOSIS — Z8673 Personal history of transient ischemic attack (TIA), and cerebral infarction without residual deficits: Secondary | ICD-10-CM | POA: Diagnosis not present

## 2019-06-07 DIAGNOSIS — Z95810 Presence of automatic (implantable) cardiac defibrillator: Secondary | ICD-10-CM | POA: Diagnosis not present

## 2019-06-07 DIAGNOSIS — Z950 Presence of cardiac pacemaker: Secondary | ICD-10-CM | POA: Diagnosis not present

## 2019-06-09 ENCOUNTER — Telehealth: Payer: Self-pay | Admitting: Cardiovascular Disease

## 2019-06-09 NOTE — Telephone Encounter (Signed)
Contacted patient via interpreter line- left message for patient to call back.

## 2019-06-09 NOTE — Telephone Encounter (Signed)
Per Interpretor Service, patient called about the covid vaccine. I read the dot phrase for covid vaccine but patient has some concerns beings that he just got out of the hospital. He wants to know if he should wait to get it or if it is safe to get it now.

## 2019-06-12 ENCOUNTER — Ambulatory Visit (INDEPENDENT_AMBULATORY_CARE_PROVIDER_SITE_OTHER): Payer: PPO | Admitting: *Deleted

## 2019-06-12 DIAGNOSIS — I5042 Chronic combined systolic (congestive) and diastolic (congestive) heart failure: Secondary | ICD-10-CM | POA: Diagnosis not present

## 2019-06-12 DIAGNOSIS — H9193 Unspecified hearing loss, bilateral: Secondary | ICD-10-CM | POA: Diagnosis not present

## 2019-06-12 DIAGNOSIS — Z9181 History of falling: Secondary | ICD-10-CM | POA: Diagnosis not present

## 2019-06-12 DIAGNOSIS — Z8546 Personal history of malignant neoplasm of prostate: Secondary | ICD-10-CM | POA: Diagnosis not present

## 2019-06-12 DIAGNOSIS — Z7951 Long term (current) use of inhaled steroids: Secondary | ICD-10-CM | POA: Diagnosis not present

## 2019-06-12 DIAGNOSIS — I4821 Permanent atrial fibrillation: Secondary | ICD-10-CM | POA: Diagnosis not present

## 2019-06-12 DIAGNOSIS — I252 Old myocardial infarction: Secondary | ICD-10-CM | POA: Diagnosis not present

## 2019-06-12 DIAGNOSIS — I441 Atrioventricular block, second degree: Secondary | ICD-10-CM | POA: Diagnosis not present

## 2019-06-12 DIAGNOSIS — Z87891 Personal history of nicotine dependence: Secondary | ICD-10-CM | POA: Diagnosis not present

## 2019-06-12 DIAGNOSIS — N183 Chronic kidney disease, stage 3 unspecified: Secondary | ICD-10-CM | POA: Diagnosis not present

## 2019-06-12 DIAGNOSIS — I071 Rheumatic tricuspid insufficiency: Secondary | ICD-10-CM | POA: Diagnosis not present

## 2019-06-12 DIAGNOSIS — I890 Lymphedema, not elsewhere classified: Secondary | ICD-10-CM | POA: Diagnosis not present

## 2019-06-12 DIAGNOSIS — I499 Cardiac arrhythmia, unspecified: Secondary | ICD-10-CM | POA: Diagnosis not present

## 2019-06-12 DIAGNOSIS — D631 Anemia in chronic kidney disease: Secondary | ICD-10-CM | POA: Diagnosis not present

## 2019-06-12 DIAGNOSIS — I110 Hypertensive heart disease with heart failure: Secondary | ICD-10-CM | POA: Diagnosis not present

## 2019-06-12 DIAGNOSIS — K219 Gastro-esophageal reflux disease without esophagitis: Secondary | ICD-10-CM | POA: Diagnosis not present

## 2019-06-12 DIAGNOSIS — I255 Ischemic cardiomyopathy: Secondary | ICD-10-CM

## 2019-06-12 DIAGNOSIS — I2510 Atherosclerotic heart disease of native coronary artery without angina pectoris: Secondary | ICD-10-CM | POA: Diagnosis not present

## 2019-06-12 DIAGNOSIS — Z951 Presence of aortocoronary bypass graft: Secondary | ICD-10-CM | POA: Diagnosis not present

## 2019-06-12 DIAGNOSIS — Z950 Presence of cardiac pacemaker: Secondary | ICD-10-CM | POA: Diagnosis not present

## 2019-06-12 DIAGNOSIS — Z8673 Personal history of transient ischemic attack (TIA), and cerebral infarction without residual deficits: Secondary | ICD-10-CM | POA: Diagnosis not present

## 2019-06-12 DIAGNOSIS — Z95810 Presence of automatic (implantable) cardiac defibrillator: Secondary | ICD-10-CM | POA: Diagnosis not present

## 2019-06-12 DIAGNOSIS — E7800 Pure hypercholesterolemia, unspecified: Secondary | ICD-10-CM | POA: Diagnosis not present

## 2019-06-12 LAB — CUP PACEART REMOTE DEVICE CHECK
Battery Remaining Longevity: 4 mo
Battery Voltage: 2.78 V
Brady Statistic AP VP Percent: 0 %
Brady Statistic AP VS Percent: 0 %
Brady Statistic AS VP Percent: 94.4 %
Brady Statistic AS VS Percent: 5.6 %
Brady Statistic RA Percent Paced: 0 %
Brady Statistic RV Percent Paced: 97.07 %
Date Time Interrogation Session: 20210329031805
HighPow Impedance: 59 Ohm
Implantable Lead Implant Date: 20000210
Implantable Lead Implant Date: 20000210
Implantable Lead Implant Date: 20141003
Implantable Lead Location: 753858
Implantable Lead Location: 753859
Implantable Lead Location: 753860
Implantable Lead Model: 4244
Implantable Lead Model: 4285
Implantable Lead Model: 4298
Implantable Lead Serial Number: 272469
Implantable Lead Serial Number: 413633
Implantable Pulse Generator Implant Date: 20141003
Lead Channel Impedance Value: 228 Ohm
Lead Channel Impedance Value: 285 Ohm
Lead Channel Impedance Value: 285 Ohm
Lead Channel Impedance Value: 285 Ohm
Lead Channel Impedance Value: 323 Ohm
Lead Channel Impedance Value: 323 Ohm
Lead Channel Impedance Value: 399 Ohm
Lead Channel Impedance Value: 399 Ohm
Lead Channel Impedance Value: 494 Ohm
Lead Channel Impedance Value: 494 Ohm
Lead Channel Impedance Value: 513 Ohm
Lead Channel Impedance Value: 513 Ohm
Lead Channel Impedance Value: 513 Ohm
Lead Channel Pacing Threshold Amplitude: 1 V
Lead Channel Pacing Threshold Amplitude: 1.75 V
Lead Channel Pacing Threshold Pulse Width: 0.4 ms
Lead Channel Pacing Threshold Pulse Width: 0.6 ms
Lead Channel Sensing Intrinsic Amplitude: 0.875 mV
Lead Channel Sensing Intrinsic Amplitude: 0.875 mV
Lead Channel Sensing Intrinsic Amplitude: 4 mV
Lead Channel Sensing Intrinsic Amplitude: 4 mV
Lead Channel Setting Pacing Amplitude: 2 V
Lead Channel Setting Pacing Amplitude: 2.75 V
Lead Channel Setting Pacing Pulse Width: 0.4 ms
Lead Channel Setting Pacing Pulse Width: 0.6 ms
Lead Channel Setting Sensing Sensitivity: 0.3 mV

## 2019-06-12 NOTE — Addendum Note (Signed)
Addended by: Jennette Banker on: 06/12/2019 02:20 PM   Modules accepted: Level of Service

## 2019-06-12 NOTE — Progress Notes (Signed)
ICD Remote  

## 2019-06-12 NOTE — Telephone Encounter (Signed)
Contacted patient via interpreter [id number]

## 2019-06-15 DIAGNOSIS — Z8546 Personal history of malignant neoplasm of prostate: Secondary | ICD-10-CM | POA: Diagnosis not present

## 2019-06-15 DIAGNOSIS — I4821 Permanent atrial fibrillation: Secondary | ICD-10-CM | POA: Diagnosis not present

## 2019-06-15 DIAGNOSIS — N183 Chronic kidney disease, stage 3 unspecified: Secondary | ICD-10-CM | POA: Diagnosis not present

## 2019-06-15 DIAGNOSIS — I441 Atrioventricular block, second degree: Secondary | ICD-10-CM | POA: Diagnosis not present

## 2019-06-15 DIAGNOSIS — I890 Lymphedema, not elsewhere classified: Secondary | ICD-10-CM | POA: Diagnosis not present

## 2019-06-15 DIAGNOSIS — I252 Old myocardial infarction: Secondary | ICD-10-CM | POA: Diagnosis not present

## 2019-06-15 DIAGNOSIS — Z87891 Personal history of nicotine dependence: Secondary | ICD-10-CM | POA: Diagnosis not present

## 2019-06-15 DIAGNOSIS — Z8673 Personal history of transient ischemic attack (TIA), and cerebral infarction without residual deficits: Secondary | ICD-10-CM | POA: Diagnosis not present

## 2019-06-15 DIAGNOSIS — I071 Rheumatic tricuspid insufficiency: Secondary | ICD-10-CM | POA: Diagnosis not present

## 2019-06-15 DIAGNOSIS — Z950 Presence of cardiac pacemaker: Secondary | ICD-10-CM | POA: Diagnosis not present

## 2019-06-15 DIAGNOSIS — Z95810 Presence of automatic (implantable) cardiac defibrillator: Secondary | ICD-10-CM | POA: Diagnosis not present

## 2019-06-15 DIAGNOSIS — D631 Anemia in chronic kidney disease: Secondary | ICD-10-CM | POA: Diagnosis not present

## 2019-06-15 DIAGNOSIS — Z951 Presence of aortocoronary bypass graft: Secondary | ICD-10-CM | POA: Diagnosis not present

## 2019-06-15 DIAGNOSIS — I499 Cardiac arrhythmia, unspecified: Secondary | ICD-10-CM | POA: Diagnosis not present

## 2019-06-15 DIAGNOSIS — K219 Gastro-esophageal reflux disease without esophagitis: Secondary | ICD-10-CM | POA: Diagnosis not present

## 2019-06-15 DIAGNOSIS — Z7951 Long term (current) use of inhaled steroids: Secondary | ICD-10-CM | POA: Diagnosis not present

## 2019-06-15 DIAGNOSIS — I255 Ischemic cardiomyopathy: Secondary | ICD-10-CM | POA: Diagnosis not present

## 2019-06-15 DIAGNOSIS — H9193 Unspecified hearing loss, bilateral: Secondary | ICD-10-CM | POA: Diagnosis not present

## 2019-06-15 DIAGNOSIS — I110 Hypertensive heart disease with heart failure: Secondary | ICD-10-CM | POA: Diagnosis not present

## 2019-06-15 DIAGNOSIS — I5042 Chronic combined systolic (congestive) and diastolic (congestive) heart failure: Secondary | ICD-10-CM | POA: Diagnosis not present

## 2019-06-15 DIAGNOSIS — Z9181 History of falling: Secondary | ICD-10-CM | POA: Diagnosis not present

## 2019-06-15 DIAGNOSIS — E7800 Pure hypercholesterolemia, unspecified: Secondary | ICD-10-CM | POA: Diagnosis not present

## 2019-06-15 DIAGNOSIS — I2510 Atherosclerotic heart disease of native coronary artery without angina pectoris: Secondary | ICD-10-CM | POA: Diagnosis not present

## 2019-06-16 ENCOUNTER — Other Ambulatory Visit: Payer: Self-pay

## 2019-06-16 ENCOUNTER — Inpatient Hospital Stay
Admission: EM | Admit: 2019-06-16 | Discharge: 2019-06-25 | DRG: 871 | Disposition: A | Discharge status: Home Health | Payer: PPO | Attending: Family Medicine | Admitting: Family Medicine

## 2019-06-16 ENCOUNTER — Emergency Department: Payer: PPO

## 2019-06-16 DIAGNOSIS — Z87891 Personal history of nicotine dependence: Secondary | ICD-10-CM | POA: Diagnosis not present

## 2019-06-16 DIAGNOSIS — I071 Rheumatic tricuspid insufficiency: Secondary | ICD-10-CM | POA: Diagnosis present

## 2019-06-16 DIAGNOSIS — Z9861 Coronary angioplasty status: Secondary | ICD-10-CM | POA: Diagnosis not present

## 2019-06-16 DIAGNOSIS — E43 Unspecified severe protein-calorie malnutrition: Secondary | ICD-10-CM | POA: Diagnosis not present

## 2019-06-16 DIAGNOSIS — I509 Heart failure, unspecified: Secondary | ICD-10-CM | POA: Diagnosis not present

## 2019-06-16 DIAGNOSIS — R000 Tachycardia, unspecified: Secondary | ICD-10-CM | POA: Diagnosis not present

## 2019-06-16 DIAGNOSIS — K921 Melena: Secondary | ICD-10-CM | POA: Diagnosis not present

## 2019-06-16 DIAGNOSIS — H9193 Unspecified hearing loss, bilateral: Secondary | ICD-10-CM | POA: Diagnosis present

## 2019-06-16 DIAGNOSIS — Z8711 Personal history of peptic ulcer disease: Secondary | ICD-10-CM

## 2019-06-16 DIAGNOSIS — L89322 Pressure ulcer of left buttock, stage 2: Secondary | ICD-10-CM | POA: Diagnosis not present

## 2019-06-16 DIAGNOSIS — I959 Hypotension, unspecified: Secondary | ICD-10-CM | POA: Diagnosis present

## 2019-06-16 DIAGNOSIS — Z8679 Personal history of other diseases of the circulatory system: Secondary | ICD-10-CM | POA: Diagnosis not present

## 2019-06-16 DIAGNOSIS — K766 Portal hypertension: Secondary | ICD-10-CM | POA: Diagnosis not present

## 2019-06-16 DIAGNOSIS — R6510 Systemic inflammatory response syndrome (SIRS) of non-infectious origin without acute organ dysfunction: Secondary | ICD-10-CM | POA: Diagnosis present

## 2019-06-16 DIAGNOSIS — C61 Malignant neoplasm of prostate: Secondary | ICD-10-CM | POA: Diagnosis not present

## 2019-06-16 DIAGNOSIS — E785 Hyperlipidemia, unspecified: Secondary | ICD-10-CM | POA: Diagnosis not present

## 2019-06-16 DIAGNOSIS — R509 Fever, unspecified: Secondary | ICD-10-CM | POA: Diagnosis not present

## 2019-06-16 DIAGNOSIS — Z951 Presence of aortocoronary bypass graft: Secondary | ICD-10-CM

## 2019-06-16 DIAGNOSIS — R1310 Dysphagia, unspecified: Secondary | ICD-10-CM | POA: Diagnosis not present

## 2019-06-16 DIAGNOSIS — I2510 Atherosclerotic heart disease of native coronary artery without angina pectoris: Secondary | ICD-10-CM

## 2019-06-16 DIAGNOSIS — R1110 Vomiting, unspecified: Secondary | ICD-10-CM | POA: Diagnosis not present

## 2019-06-16 DIAGNOSIS — Q2733 Arteriovenous malformation of digestive system vessel: Secondary | ICD-10-CM | POA: Diagnosis not present

## 2019-06-16 DIAGNOSIS — R601 Generalized edema: Secondary | ICD-10-CM | POA: Diagnosis present

## 2019-06-16 DIAGNOSIS — R7881 Bacteremia: Secondary | ICD-10-CM | POA: Diagnosis not present

## 2019-06-16 DIAGNOSIS — K449 Diaphragmatic hernia without obstruction or gangrene: Secondary | ICD-10-CM | POA: Diagnosis present

## 2019-06-16 DIAGNOSIS — Z209 Contact with and (suspected) exposure to unspecified communicable disease: Secondary | ICD-10-CM | POA: Diagnosis not present

## 2019-06-16 DIAGNOSIS — I890 Lymphedema, not elsewhere classified: Secondary | ICD-10-CM

## 2019-06-16 DIAGNOSIS — Z95810 Presence of automatic (implantable) cardiac defibrillator: Secondary | ICD-10-CM | POA: Diagnosis not present

## 2019-06-16 DIAGNOSIS — M7989 Other specified soft tissue disorders: Secondary | ICD-10-CM | POA: Diagnosis not present

## 2019-06-16 DIAGNOSIS — Z79899 Other long term (current) drug therapy: Secondary | ICD-10-CM

## 2019-06-16 DIAGNOSIS — I351 Nonrheumatic aortic (valve) insufficiency: Secondary | ICD-10-CM | POA: Diagnosis not present

## 2019-06-16 DIAGNOSIS — Q211 Atrial septal defect: Secondary | ICD-10-CM | POA: Diagnosis not present

## 2019-06-16 DIAGNOSIS — I4821 Permanent atrial fibrillation: Secondary | ICD-10-CM | POA: Diagnosis present

## 2019-06-16 DIAGNOSIS — H903 Sensorineural hearing loss, bilateral: Secondary | ICD-10-CM | POA: Diagnosis present

## 2019-06-16 DIAGNOSIS — B951 Streptococcus, group B, as the cause of diseases classified elsewhere: Secondary | ICD-10-CM | POA: Diagnosis present

## 2019-06-16 DIAGNOSIS — Z8249 Family history of ischemic heart disease and other diseases of the circulatory system: Secondary | ICD-10-CM

## 2019-06-16 DIAGNOSIS — R0902 Hypoxemia: Secondary | ICD-10-CM | POA: Diagnosis not present

## 2019-06-16 DIAGNOSIS — I25708 Atherosclerosis of coronary artery bypass graft(s), unspecified, with other forms of angina pectoris: Secondary | ICD-10-CM | POA: Diagnosis not present

## 2019-06-16 DIAGNOSIS — K7469 Other cirrhosis of liver: Secondary | ICD-10-CM | POA: Diagnosis not present

## 2019-06-16 DIAGNOSIS — N5089 Other specified disorders of the male genital organs: Secondary | ICD-10-CM | POA: Diagnosis present

## 2019-06-16 DIAGNOSIS — Z888 Allergy status to other drugs, medicaments and biological substances status: Secondary | ICD-10-CM

## 2019-06-16 DIAGNOSIS — K2940 Chronic atrophic gastritis without bleeding: Secondary | ICD-10-CM | POA: Diagnosis not present

## 2019-06-16 DIAGNOSIS — I5043 Acute on chronic combined systolic (congestive) and diastolic (congestive) heart failure: Secondary | ICD-10-CM | POA: Diagnosis present

## 2019-06-16 DIAGNOSIS — Z6828 Body mass index (BMI) 28.0-28.9, adult: Secondary | ICD-10-CM | POA: Diagnosis not present

## 2019-06-16 DIAGNOSIS — R5381 Other malaise: Secondary | ICD-10-CM | POA: Diagnosis present

## 2019-06-16 DIAGNOSIS — I110 Hypertensive heart disease with heart failure: Secondary | ICD-10-CM | POA: Diagnosis present

## 2019-06-16 DIAGNOSIS — R001 Bradycardia, unspecified: Secondary | ICD-10-CM | POA: Diagnosis not present

## 2019-06-16 DIAGNOSIS — N183 Chronic kidney disease, stage 3 unspecified: Secondary | ICD-10-CM | POA: Diagnosis present

## 2019-06-16 DIAGNOSIS — Z452 Encounter for adjustment and management of vascular access device: Secondary | ICD-10-CM | POA: Diagnosis not present

## 2019-06-16 DIAGNOSIS — I255 Ischemic cardiomyopathy: Secondary | ICD-10-CM | POA: Diagnosis present

## 2019-06-16 DIAGNOSIS — I272 Pulmonary hypertension, unspecified: Secondary | ICD-10-CM | POA: Diagnosis present

## 2019-06-16 DIAGNOSIS — R197 Diarrhea, unspecified: Secondary | ICD-10-CM | POA: Diagnosis present

## 2019-06-16 DIAGNOSIS — K769 Liver disease, unspecified: Secondary | ICD-10-CM | POA: Diagnosis not present

## 2019-06-16 DIAGNOSIS — A401 Sepsis due to streptococcus, group B: Principal | ICD-10-CM | POA: Diagnosis present

## 2019-06-16 DIAGNOSIS — L8992 Pressure ulcer of unspecified site, stage 2: Secondary | ICD-10-CM | POA: Diagnosis present

## 2019-06-16 DIAGNOSIS — R8271 Bacteriuria: Secondary | ICD-10-CM | POA: Diagnosis present

## 2019-06-16 DIAGNOSIS — N179 Acute kidney failure, unspecified: Secondary | ICD-10-CM | POA: Diagnosis not present

## 2019-06-16 DIAGNOSIS — Z95828 Presence of other vascular implants and grafts: Secondary | ICD-10-CM

## 2019-06-16 DIAGNOSIS — E876 Hypokalemia: Secondary | ICD-10-CM | POA: Diagnosis present

## 2019-06-16 DIAGNOSIS — K7460 Unspecified cirrhosis of liver: Secondary | ICD-10-CM | POA: Diagnosis not present

## 2019-06-16 DIAGNOSIS — D500 Iron deficiency anemia secondary to blood loss (chronic): Secondary | ICD-10-CM | POA: Diagnosis not present

## 2019-06-16 DIAGNOSIS — E872 Acidosis, unspecified: Secondary | ICD-10-CM | POA: Diagnosis present

## 2019-06-16 DIAGNOSIS — K31811 Angiodysplasia of stomach and duodenum with bleeding: Secondary | ICD-10-CM | POA: Diagnosis not present

## 2019-06-16 DIAGNOSIS — N135 Crossing vessel and stricture of ureter without hydronephrosis: Secondary | ICD-10-CM | POA: Diagnosis not present

## 2019-06-16 DIAGNOSIS — L89312 Pressure ulcer of right buttock, stage 2: Secondary | ICD-10-CM | POA: Diagnosis present

## 2019-06-16 DIAGNOSIS — Z950 Presence of cardiac pacemaker: Secondary | ICD-10-CM

## 2019-06-16 DIAGNOSIS — Z8546 Personal history of malignant neoplasm of prostate: Secondary | ICD-10-CM

## 2019-06-16 DIAGNOSIS — I361 Nonrheumatic tricuspid (valve) insufficiency: Secondary | ICD-10-CM | POA: Diagnosis not present

## 2019-06-16 DIAGNOSIS — K2970 Gastritis, unspecified, without bleeding: Secondary | ICD-10-CM | POA: Diagnosis not present

## 2019-06-16 DIAGNOSIS — R011 Cardiac murmur, unspecified: Secondary | ICD-10-CM | POA: Diagnosis not present

## 2019-06-16 DIAGNOSIS — Z8673 Personal history of transient ischemic attack (TIA), and cerebral infarction without residual deficits: Secondary | ICD-10-CM

## 2019-06-16 DIAGNOSIS — I480 Paroxysmal atrial fibrillation: Secondary | ICD-10-CM | POA: Diagnosis not present

## 2019-06-16 DIAGNOSIS — I340 Nonrheumatic mitral (valve) insufficiency: Secondary | ICD-10-CM | POA: Diagnosis not present

## 2019-06-16 DIAGNOSIS — K31819 Angiodysplasia of stomach and duodenum without bleeding: Secondary | ICD-10-CM | POA: Diagnosis not present

## 2019-06-16 DIAGNOSIS — I130 Hypertensive heart and chronic kidney disease with heart failure and stage 1 through stage 4 chronic kidney disease, or unspecified chronic kidney disease: Secondary | ICD-10-CM | POA: Diagnosis not present

## 2019-06-16 DIAGNOSIS — Z20822 Contact with and (suspected) exposure to covid-19: Secondary | ICD-10-CM | POA: Diagnosis not present

## 2019-06-16 DIAGNOSIS — N451 Epididymitis: Secondary | ICD-10-CM | POA: Diagnosis present

## 2019-06-16 DIAGNOSIS — D649 Anemia, unspecified: Secondary | ICD-10-CM | POA: Diagnosis present

## 2019-06-16 DIAGNOSIS — R52 Pain, unspecified: Secondary | ICD-10-CM | POA: Diagnosis not present

## 2019-06-16 DIAGNOSIS — J9811 Atelectasis: Secondary | ICD-10-CM | POA: Diagnosis not present

## 2019-06-16 DIAGNOSIS — I252 Old myocardial infarction: Secondary | ICD-10-CM | POA: Diagnosis not present

## 2019-06-16 DIAGNOSIS — R600 Localized edema: Secondary | ICD-10-CM | POA: Diagnosis not present

## 2019-06-16 LAB — COMPREHENSIVE METABOLIC PANEL
ALT: 17 U/L (ref 0–44)
AST: 51 U/L — ABNORMAL HIGH (ref 15–41)
Albumin: 2.1 g/dL — ABNORMAL LOW (ref 3.5–5.0)
Alkaline Phosphatase: 100 U/L (ref 38–126)
Anion gap: 12 (ref 5–15)
BUN: 32 mg/dL — ABNORMAL HIGH (ref 8–23)
CO2: 26 mmol/L (ref 22–32)
Calcium: 8.1 mg/dL — ABNORMAL LOW (ref 8.9–10.3)
Chloride: 102 mmol/L (ref 98–111)
Creatinine, Ser: 1.76 mg/dL — ABNORMAL HIGH (ref 0.61–1.24)
GFR calc Af Amer: 41 mL/min — ABNORMAL LOW (ref >60)
GFR calc non Af Amer: 35 mL/min — ABNORMAL LOW (ref >60)
Glucose, Bld: 118 mg/dL — ABNORMAL HIGH (ref 70–99)
Potassium: 3.3 mmol/L — ABNORMAL LOW (ref 3.5–5.1)
Sodium: 140 mmol/L (ref 135–145)
Total Bilirubin: 1.1 mg/dL (ref 0.3–1.2)
Total Protein: 5.2 g/dL — ABNORMAL LOW (ref 6.5–8.1)

## 2019-06-16 LAB — CBC WITH DIFFERENTIAL/PLATELET
Abs Immature Granulocytes: 0.23 10*3/uL — ABNORMAL HIGH (ref 0.00–0.07)
Basophils Absolute: 0 10*3/uL (ref 0.0–0.1)
Basophils Relative: 0 %
Eosinophils Absolute: 0.2 10*3/uL (ref 0.0–0.5)
Eosinophils Relative: 1 %
HCT: 27.7 % — ABNORMAL LOW (ref 39.0–52.0)
Hemoglobin: 8.5 g/dL — ABNORMAL LOW (ref 13.0–17.0)
Immature Granulocytes: 1 %
Lymphocytes Relative: 3 %
Lymphs Abs: 0.6 10*3/uL — ABNORMAL LOW (ref 0.7–4.0)
MCH: 23.9 pg — ABNORMAL LOW (ref 26.0–34.0)
MCHC: 30.7 g/dL (ref 30.0–36.0)
MCV: 78 fL — ABNORMAL LOW (ref 80.0–100.0)
Monocytes Absolute: 0.9 10*3/uL (ref 0.1–1.0)
Monocytes Relative: 5 %
Neutro Abs: 16.1 10*3/uL — ABNORMAL HIGH (ref 1.7–7.7)
Neutrophils Relative %: 90 %
Platelets: 206 10*3/uL (ref 150–400)
RBC: 3.55 MIL/uL — ABNORMAL LOW (ref 4.22–5.81)
RDW: 20.8 % — ABNORMAL HIGH (ref 11.5–15.5)
WBC: 17.9 10*3/uL — ABNORMAL HIGH (ref 4.0–10.5)
nRBC: 0 % (ref 0.0–0.2)

## 2019-06-16 LAB — URINALYSIS, ROUTINE W REFLEX MICROSCOPIC
Bacteria, UA: NONE SEEN
Bilirubin Urine: NEGATIVE
Glucose, UA: NEGATIVE mg/dL
Hgb urine dipstick: NEGATIVE
Ketones, ur: NEGATIVE mg/dL
Leukocytes,Ua: NEGATIVE
Nitrite: NEGATIVE
Protein, ur: 30 mg/dL — AB
Specific Gravity, Urine: 1.014 (ref 1.005–1.030)
Squamous Epithelial / HPF: NONE SEEN (ref 0–5)
pH: 5 (ref 5.0–8.0)

## 2019-06-16 LAB — PROTIME-INR
INR: 1.4 — ABNORMAL HIGH (ref 0.8–1.2)
Prothrombin Time: 17.2 seconds — ABNORMAL HIGH (ref 11.4–15.2)

## 2019-06-16 LAB — RESPIRATORY PANEL BY RT PCR (FLU A&B, COVID)
Influenza A by PCR: NEGATIVE
Influenza B by PCR: NEGATIVE
SARS Coronavirus 2 by RT PCR: NEGATIVE

## 2019-06-16 LAB — LACTIC ACID, PLASMA: Lactic Acid, Venous: 4.6 mmol/L (ref 0.5–1.9)

## 2019-06-16 LAB — APTT: aPTT: 43 seconds — ABNORMAL HIGH (ref 24–36)

## 2019-06-16 MED ORDER — IOHEXOL 9 MG/ML PO SOLN: 500.0000 mL | ORAL | Status: AC

## 2019-06-16 MED ORDER — VANCOMYCIN HCL IN DEXTROSE 1-5 GM/200ML-% IV SOLN
1000.0000 mg | Freq: Once | INTRAVENOUS | Status: AC
  Administered 2019-06-16: 1000 mg via INTRAVENOUS
  Filled 2019-06-16: qty 200

## 2019-06-16 MED ORDER — SODIUM CHLORIDE 0.9 % IV BOLUS (SEPSIS): 1000.0000 mL | Freq: Once | INTRAVENOUS | Status: DC

## 2019-06-16 MED ORDER — SODIUM CHLORIDE 0.9 % IV BOLUS (SEPSIS)
1000.0000 mL | Freq: Once | INTRAVENOUS | Status: DC
  Administered 2019-06-16: 1000 mL via INTRAVENOUS

## 2019-06-16 MED ORDER — PIPERACILLIN-TAZOBACTAM 3.375 G IVPB
3.3750 g | Freq: Three times a day (TID) | INTRAVENOUS | Status: DC
  Administered 2019-06-17 – 2019-06-18 (×4): 3.375 g via INTRAVENOUS
  Filled 2019-06-16 (×4): qty 50

## 2019-06-16 MED ORDER — PIPERACILLIN-TAZOBACTAM 3.375 G IVPB 30 MIN
3.3750 g | Freq: Once | INTRAVENOUS | Status: AC
  Administered 2019-06-16: 3.375 g via INTRAVENOUS
  Filled 2019-06-16: qty 50

## 2019-06-16 MED ORDER — ONDANSETRON HCL 4 MG/2ML IJ SOLN
4.0000 mg | Freq: Once | INTRAMUSCULAR | Status: AC
  Administered 2019-06-16: 4 mg via INTRAVENOUS
  Filled 2019-06-16: qty 2

## 2019-06-16 MED ORDER — SODIUM CHLORIDE 0.9 % IV SOLN: Freq: Once | INTRAVENOUS | Status: AC

## 2019-06-16 NOTE — H&P (Addendum)
History and Physical    ANACLETO WEINERT [id number] DOB: December 23, 1934 DOA: 06/16/2019  PCP: [name], MD  Patient coming from: home   Chief Complaint: chills, vomiting, diarrhea  HPI: Thomas [name] is a 85 y.o. male with medical history significant for chronic a-fib, sCHF (ef 35-40%), ICD/pacemaker, htn, deaf, ckd3, anasarca, GIB 2/2 AVMs, CVA, and newly identified possible cirrhosis, presenting with above.  Admitted last month for CHF exacerbation and anasarca, treated w/ aggressive diuresis. Also transfused one unit, and with imaging consistent w/ cirrhosis and ascites.  Per wife has been relatively stable at home since discharge. No change in LE swelling. Earlier today began to experience chills and shakes. Had nbnb emesis and diarrhea (no blood). Also more confused than normal, and less responsive. Denies pain, denies abdominal pain, denies chest pain, denies dysuria. Denies new skin lesions. Also complaining of some shortness of breath, no cough. An episode like this has never happened before. Denies history of c diff or other intestinal infections. Did receive empiric antibiotics at start of recent hospitalization.  ED Course: labs, antibiotics, cxr.  Review of Systems: As per HPI otherwise 10 point review of systems negative.    Past Medical History:  Diagnosis Date  . AICD (automatic cardioverter/defibrillator) present   . Biventricular ICD (implantable cardioverter-defibrillator) in place 03/24/2005   Implantation of a [employer]onic Adapta ADDRO1, serial number [id number]  . CHF (congestive heart failure) ([place])   . CKD (chronic kidney disease), stage III   . Coronary artery disease    a. s/p CABG 1986. b. Multiple PCIs/caths. c. 09/2013: s/p PTCA and BMS to SVG-OM.  [name]   . Dysrhythmia   . History of abdominal aortic aneurysm   . History of bleeding peptic ulcer 1980  . History of epididymitis 2013  . HTN (hypertension)   . Hydronephrosis with ureteropelvic junction  obstruction   . Hydroureter on left 2009  . Hypertension   . Ischemic cardiomyopathy    a. Prior EF 30-35%, s/p BIV-ICD. b. 09/2013: EF 45-50%.  . Moderate tricuspid regurgitation   . PAF (paroxysmal atrial fibrillation) ([place])   . Presence of permanent cardiac pacemaker   . Prostate cancer ([name])   . Status post coronary artery bypass grafting 1986   LIMA to the LAD, SVG to OM, SVG to RCA  . Testicular swelling     Past Surgical History:  Procedure Laterality Date  . 2-D echocardiogram  11/20/2011   Ejection fraction 30-35% moderate concentric left ventricular hypertrophy. Left atrium is moderately dilated. Mild MR. Mild or  . BI-VENTRICULAR IMPLANTABLE CARDIOVERTER DEFIBRILLATOR N/A 12/16/2012   Procedure: BI-VENTRICULAR IMPLANTABLE CARDIOVERTER DEFIBRILLATOR  (CRT-D);  Surgeon: [name], MD;  Location: [facility] CATH LAB;  Service: Cardiovascular;  Laterality: N/A;  . CARDIAC CATHETERIZATION  12/10/2011   SVG to OM widely patent.  LIMA to LAD patent  . CATARACT EXTRACTION W/PHACO Right 10/12/2017   Procedure: CATARACT EXTRACTION PHACO AND INTRAOCULAR LENS PLACEMENT (IOC);  Surgeon: Birder Robson, MD;  Location: [facility] ORS;  Service: Ophthalmology;  Laterality: Right;  [place] 00:57 AP% 15.9 CDE 9.07 Fluid pack lot # [id number]  . COLONOSCOPY N/A 07/13/2018   Procedure: COLONOSCOPY;  Surgeon: Toledo, Benay Pike, MD;  Location: [facility] ENDOSCOPY;  Service: Gastroenterology;  Laterality: N/A;  . CORONARY ARTERY BYPASS GRAFT  1986  . ENTEROSCOPY N/A 09/14/2018   Procedure: ENTEROSCOPY;  Surgeon: Toledo, Benay Pike, MD;  Location: [facility] ENDOSCOPY;  Service: Gastroenterology;  Laterality: N/A;  symptomatic anemia, GI blood loss anemia, melena,  positive small bowel capsule endoscopy showing source of bleeding   . ESOPHAGOGASTRODUODENOSCOPY N/A 07/13/2018   Procedure: ESOPHAGOGASTRODUODENOSCOPY (EGD);  Surgeon: Toledo, Benay Pike, MD;  Location: [facility] ENDOSCOPY;  Service: Gastroenterology;  Laterality: N/A;  .  ESOPHAGOGASTRODUODENOSCOPY N/A 09/14/2018   Procedure: ESOPHAGOGASTRODUODENOSCOPY (EGD);  Surgeon: Toledo, Benay Pike, MD;  Location: [facility] ENDOSCOPY;  Service: Gastroenterology;  Laterality: N/A;  SIGN LANAGUAGE INTERPRETER  . ESOPHAGOGASTRODUODENOSCOPY (EGD) WITH PROPOFOL N/A 05/27/2018   Procedure: ESOPHAGOGASTRODUODENOSCOPY (EGD) WITH PROPOFOL;  Surgeon: Clarene Essex, MD;  Location: [facility];  Service: Endoscopy;  Laterality: N/A;  . INSERT / REPLACE / REMOVE PACEMAKER    . LEFT HEART CATHETERIZATION WITH CORONARY/GRAFT ANGIOGRAM N/A 12/10/2011   Procedure: LEFT HEART CATHETERIZATION WITH Beatrix Fetters;  Surgeon: Sanda Klein, MD;  Location: [facility] CATH LAB;  Service: Cardiovascular;  Laterality: N/A;  . LEFT HEART CATHETERIZATION WITH CORONARY/GRAFT ANGIOGRAM N/A 09/25/2013   Procedure: LEFT HEART CATHETERIZATION WITH Beatrix Fetters;  Surgeon: Blane Ohara, MD;  Location[facility] C[facility] LAB;  Service: Cardiovascular;  Laterality: N/A;  . Persantine Myoview  05/06/2010   Post-rest ejection fraction 30%. No significant ischemia demonstrated. Compared to previous study there is no significant change.  . TRANSURETHRAL RESECTION OF PROSTATE     s/p     reports that he quit smoking about 34 years ago. He has never used smokeless tobacco. He reports that he does not drink alcohol or use drugs.  Allergies  Allergen Reactions  . Entresto [Sacubitril-Valsartan] Swelling    And bruising of arm  . Phenazopyridine Nausea Only and Other (See Comments)    GI UPSET  . Ramipril Other (See Comments)    unk Other reaction(s): Other (See Comments), Unknown unk    Family History  Problem Relation Age of Onset  . Hypertension Father     Prior to Admission medications   Medication Sig Start Date End Date Taking? Authorizing Provider  albuterol (VENTOLIN HFA) 108 (90 Base) MCG/ACT inhaler Inhale 2 puffs into the lungs 4 (four) times daily as needed. Patient taking differently: Inhale 2  puffs into the lungs 4 (four) times daily as needed for wheezing or shortness of breath.  08/02/18   Croitoru, Mihai, MD  carvedilol (COREG) 3.125 MG tablet Take 1 tablet (3.125 mg total) by mouth 2 (two) times daily with a meal. Patient taking differently: Take 12.5 mg by mouth daily.  08/17/18   Lavina Hamman, MD  [employer] ACETAMINOPHEN EX ST 500 MG tablet Take 500-1,000 mg by mouth every 6 (six) hours as needed for pain or fever. 04/26/19   [provider]  feeding supplement, ENSURE ENLIVE, (ENSURE ENLIVE) LIQD Take 237 mLs by mouth 3 (three) times daily between meals. 05/29/19   Nicole Kindred A, DO  ferrous sulfate 325 (65 FE) MG tablet Take 1 tablet (325 mg total) by mouth daily for 30 days. 05/28/18   Elodia Florence., MD  Multiple Vitamin (MULTIVITAMIN WITH MINERALS) TABS tablet Take 1 tablet by mouth daily. 05/29/19   Ezekiel Slocumb, DO  pantoprazole (PROTONIX) 40 MG tablet Take 1 tablet (40 mg total) by mouth 2 (two) times daily before a meal. Patient taking differently: Take 40 mg by mouth daily.  09/16/18   Demetrios Loll, MD  simvastatin (ZOCOR) 20 MG tablet TAKE 1 TABLET BY MOUTH EVERY DAY Patient taking differently: Take 20 mg by mouth daily.  04/07/19   Erlene Quan, PA-C  torsemide (DEMADEX) 20 MG tablet Take 2 tablets (40 mg total) by mouth daily.  05/30/19   Ezekiel Slocumb, DO  triamcinolone cream (KENALOG) 0.1 % Apply 1 application topically 2 (two) times daily as needed for itching. (avoid face, groin and axilla) 04/17/19   [provider]  vitamin B-12 (CYANOCOBALAMIN) 500 MCG tablet Take 1 tablet (500 mcg total) by mouth daily. 08/18/18   Maisie Fus, MD    Physical Exam: Vitals:   06/16/19 2053 06/16/19 2059  BP: (!) 125/58   Pulse: 68   Resp: (!) 26   Temp: (!) 101.4 F (38.6 C)   TempSrc: Oral   SpO2: 95%   Weight:  91.2 kg  Height:  6\' 2"  (1.88 m)    Constitutional: mild distress, chronically ill appearing Head: Atraumatic Eyes: Conjunctiva  clear ENM: dry mucous membranes. Poor dentition.  Neck: Supple Respiratory: poor inspiratory effort. Rales at bases. No wheeze. Cardiovascular: Regular rate and rhythm. Mod systolic murmur Abdomen: mildly distended, pos bowel sounds, no rebound or guarding, non tender. Musculoskeletal: No joint deformity upper and lower extremities. Normal ROM, no contractures. Decreased muscle tone.  Skin: vessicles lower extremities. Erythema of sacrum no ulcer Extremities: severe edema to abdomen Neurologic: Alert, moving all 4 extremities. Deaf. Psychiatric: unable to assess.   Labs on Admission: I have personally reviewed following labs and imaging studies  CBC: Recent Labs  Lab 06/16/19 2052  WBC 17.9*  NEUTROABS 16.1*  HGB 8.5*  HCT 27.7*  MCV 78.0*  PLT 99991111   Basic Metabolic Panel: Recent Labs  Lab 06/16/19 2052  NA 140  K 3.3*  CL 102  CO2 26  GLUCOSE 118*  BUN 32*  CREATININE 1.76*  CALCIUM 8.1*   GFR: Estimated Creatinine Clearance: 37.6 mL/min (A) (by C-G formula based on SCr of 1.76 mg/dL (H)). Liver Function Tests: Recent Labs  Lab 06/16/19 2052  AST 51*  ALT 17  ALKPHOS 100  BILITOT 1.1  PROT 5.2*  ALBUMIN 2.1*   No results for input(s): LIPASE, AMYLASE in the last 168 hours. No results for input(s): AMMONIA in the last 168 hours. Coagulation Profile: Recent Labs  Lab 06/16/19 2052  INR 1.4*   Cardiac Enzymes: No results for input(s): CKTOTAL, CKMB, CKMBINDEX, TROPONINI in the last 168 hours. BNP (last 3 results) No results for input(s): PROBNP in the last 8760 hours. HbA1C: No results for input(s): HGBA1C in the last 72 hours. CBG: No results for input(s): GLUCAP in the last 168 hours. Lipid Profile: No results for input(s): CHOL, HDL, LDLCALC, TRIG, CHOLHDL, LDLDIRECT in the last 72 hours. Thyroid Function Tests: No results for input(s): TSH, T4TOTAL, FREET4, T3FREE, THYROIDAB in the last 72 hours. Anemia Panel: No results for input(s):  VITAMINB12, FOLATE, FERRITIN, TIBC, IRON, RETICCTPCT in the last 72 hours. Urine analysis:    Component Value Date/Time   COLORURINE YELLOW (A) 06/16/2019 2304   APPEARANCEUR HAZY (A) 06/16/2019 2304   APPEARANCEUR Clear 12/13/2017 0918   LABSPEC 1.014 06/16/2019 2304   LABSPEC 1.021 11/19/2011 1501   PHURINE 5.0 06/16/2019 2304   GLUCOSEU NEGATIVE 06/16/2019 2304   GLUCOSEU Negative 11/19/2011 1501   HGBUR NEGATIVE 06/16/2019 2304   BILIRUBINUR NEGATIVE 06/16/2019 2304   BILIRUBINUR Negative 12/13/2017 0918   BILIRUBINUR Negative 11/19/2011 1501   KETONESUR NEGATIVE 06/16/2019 2304   PROTEINUR 30 (A) 06/16/2019 2304   NITRITE NEGATIVE 06/16/2019 2304   LEUKOCYTESUR NEGATIVE 06/16/2019 2304   LEUKOCYTESUR Negative 11/19/2011 1501    Radiological Exams on Admission: DG Chest Port 1 View  Result Date: 06/16/2019 CLINICAL DATA:  Fevers  and lethargy EXAM: PORTABLE CHEST 1 VIEW COMPARISON:  05/19/2019 FINDINGS: Cardiac shadow is enlarged in size but stable. Defibrillator is again seen and stable. Postsurgical changes are again noted. Aortic calcifications are seen. Vascular congestion is again identified with mild edema. No focal infiltrate is seen. Stable left basilar atelectasis is noted. No acute bony abnormality is seen. IMPRESSION: Stable changes of vascular congestion.  No acute abnormality noted. Electronically Signed   By: Inez Catalina M.D.   On: 06/16/2019 21:39    EKG: Independently reviewed. Bi-v paced rhythm  Assessment/Plan Principal Problem:   SIRS (systemic inflammatory response syndrome) (HCC) Active Problems:   Cardiomyopathy, ischemic   CAD Status post coronary artery bypass grafting: 1995   Permanent atrial fibrillation ([place])   Biventricular ICD in place   Benign essential HTN   CAD S/P multiple PCIs   Essential hypertension   CRI (chronic renal insufficiency), stage 3 (moderate)   Anasarca   # SIRS - WBCs elevated, lactate elevated, with mild aki, and  febrile. given vomiting and diarrhea suspect possible GI source at this point, vs bacteremia from unknown source. No signs cellulitis. CXR clear though does have a new o2 requirement. covid and flu negative. Urinalysis not suggestive of infection. No report of IV drug use. Has not stooled since arriving to the ED but given recent hospitalization am concerned for possible c diff. Hemodynamically stable but fragile patient at baseline.  - given aki on ckd with gfr near 30 have held for now on CT imaging of abdomen/pelvis w/ IV contrast but did discuss w/ radiology who thinks a non-contrast CT could still be helpful in terms of evaluating for serious intraabdominal pathology, so will order that study - continue vanc/zosyn for now - judicious fluids given sCHF, ordered @ 75/hr - repeat lactate and bmp; check lipase - f/u blood and urine cultures - enteric precautions - step-down  # Systolic heart failure - recent ef 35-40%. Wt today (201) is same as at cardiologist's 2 wks ago, d/c wt was 199, and wife reports no change in edema, no pulmonary edema on exam. Thus do not think sig chf exacerbation at this point. Has pacer/icd. - cont carvedilol - hold torsemide for now given signs dehydration; gentle fluid resuscitation as above - daily weights, I/Os  # Hypoxia - appears to be 2/2 acute illness. o2 monitor also with difficulty tracing. cxr clear, do not think sig fluid overload.  - With relative immobility and recent hospitalization and recent d/c of home anticoagulant, would need to consider PE w/u if worsening respiratory status - continue [place] O2  # hypokalemia - mild, 3.3. likely 2/2 combination diuretics and GI losses.  - ns w/ 20 of KCl - f/u Mg level  # Acute kidney injury - likely prerenal 2/2 sirs. Cr 1.76 from baseline of around 1.3. - treat as above  # Anemia - chronic, hgb 8.5 today which is stable - monitor  # Deaf  - interpreter used  # chronic a-fib - recent d/c of  anticoagulation - cont carvedilol  # Stage 1 pressure ulcer - nursing wound care order  DVT prophylaxis: heparin Code Status: full code  Family Communication: wife, at bedside  Disposition Plan: tbd  Consults called: none  Admission status: step-down    Desma Maxim MD [facility] Pager [phone]  If 7PM-7AM, please contact night-coverage [email or web address] Password [facility]  06/16/2019, 11:48 PM

## 2019-06-16 NOTE — Telephone Encounter (Signed)
Patient's wife returned call.  

## 2019-06-16 NOTE — ED Triage Notes (Signed)
Per EMS report, Patient is hearing impaired and patient's wife found him lethargic in his wheelchair in the bathroom. Wife stood him up and patient had diarrhea. Patient has nausea and vomiting per wife's report through [place] interpreter. Patient arrived with BLE pitting edema. Patient is alert upon arrival. Video ASL interpreter was used, but patient smiled and nodded, but did not sign back. Patient had an oral temp for EMS of 102.2.

## 2019-06-16 NOTE — ED Notes (Signed)
Admitting md at bedside

## 2019-06-16 NOTE — Telephone Encounter (Signed)
LMVM-via intrepeter

## 2019-06-16 NOTE — Telephone Encounter (Signed)
Contacted patient- LVM again to call back. 3rd attempt will remove from triage pool,.

## 2019-06-16 NOTE — ED Notes (Signed)
Report given to Michelle RN

## 2019-06-16 NOTE — ED Notes (Addendum)
Dr. [name] aware of Lactic acid of 4.6. Patient placed on 2L O2 via [place].

## 2019-06-16 NOTE — ED Provider Notes (Signed)
[facility] Emergency Department Provider Note       Time seen: ----------------------------------------- 8:39 PM on 06/16/2019 -----------------------------------------  Level V caveat: History/ROS limited by altered mental status I have reviewed the triage vital signs and the nursing notes.  HISTORY   Chief Complaint No chief complaint on file.    HPI Thomas [name] is a 85 y.o. male with a history of CHF, CKD, coronary disease, paroxysmal atrial fibrillation, prostate cancer who presents to the ED for fever and lethargy.  Wife found him lethargic in his wheelchair in the bathroom.  She then stood him up and he had diarrhea.  He has had nausea and vomiting, also arrived with pitting edema.  Patient was noted to be febrile to 102.2 by EMS.  Past Medical History:  Diagnosis Date  . AICD (automatic cardioverter/defibrillator) present   . Biventricular ICD (implantable cardioverter-defibrillator) in place 03/24/2005   Implantation of a Med[employer]onic Adapta ADDRO1, serial number [id number]  . CHF (congestive heart failure) ([place])   . CKD (chronic kidney disease), stage III   . Coronary artery disease    a. s/p CABG 1986. b. Multiple PCIs/caths. c. 09/2013: s/p PTCA and BMS to SVG-OM.  [name]   . Dysrhythmia   . History of abdominal aortic aneurysm   . History of bleeding peptic ulcer 1980  . History of epididymitis 2013  . HTN (hypertension)   . Hydronephrosis with ureteropelvic junction obstruction   . Hydroureter on left 2009  . Hypertension   . Ischemic cardiomyopathy    a. Prior EF 30-35%, s/p BIV-ICD. b. 09/2013: EF 45-50%.  . Moderate tricuspid regurgitation   . PAF (paroxysmal atrial fibrillation) ([place])   . Presence of permanent cardiac pacemaker   . Prostate cancer ([place])   . Status post coronary artery bypass grafting 1986   LIMA to the LAD, SVG to OM, SVG to RCA  . Testicular swelling     Patient Active Problem List   Diagnosis Date Noted  .  Leg swelling   . Decompensated hepatic cirrhosis ([place]) 05/22/2019  . Anasarca 05/19/2019  . CRI (chronic renal insufficiency), stage 3 (moderate) 01/30/2019  . Iron deficiency anemia due to chronic blood loss 11/02/2018  . Coronary artery disease of bypass graft of native heart with stable angina pectoris ([place]) 11/02/2018  . Pure hypercholesterolemia 11/02/2018  . Biventricular automatic implantable cardioverter defibrillator in situ 11/02/2018  . GIB (gastrointestinal bleeding) 09/14/2018  . Hypotension 08/14/2018  . Acute on chronic systolic CHF (congestive heart failure) ([place]) 08/12/2018  . Acute on chronic combined systolic and diastolic CHF (congestive heart failure) ([place])   . GI bleed 05/26/2018  . Occult GI bleeding 05/25/2018  . Normocytic anemia 05/25/2018  . Elevated troponin 05/25/2018  . Anemia   . Lymphedema 02/28/2018  . Weakness 07/15/2016  . Fatigue 07/15/2016  . CVA (cerebral infarction) 10/31/2015  . Bulbous urethral stricture 09/18/2015  . Bilateral deafness 08/19/2015  . Bilateral cataracts 08/19/2015  . Acid reflux 08/19/2015  . HLD (hyperlipidemia) 08/19/2015  . Essential hypertension 08/19/2015  . Myocardial infarction ([place]) 08/19/2015  . Calculus of kidney 08/19/2015  . Artificial cardiac pacemaker 08/19/2015  . Gastroduodenal ulcer 08/19/2015  . Dupuytren's contracture of foot 08/19/2015  . Malignant neoplasm of prostate ([place]) 08/19/2015  . Microhematuria 08/19/2015  . CAD S/P multiple PCIs 02/22/2015  . Status post coronary artery bypass grafting 02/22/2015  . Benign essential HTN 04/02/2014  . Hematochezia 02/20/2014  . Tricuspid valve regurgitation, nonrheumatic 02/20/2014  .  Mobitz type II atrioventricular block 12/12/2013  . Long term current use of anticoagulant 10/18/2013  . Cardiomyopathy, ischemic 11/11/2012  . CAD Status post coronary artery bypass grafting: 1995 11/11/2012  . Permanent atrial fibrillation ([place]) 11/11/2012  .  Biventricular ICD in place 11/11/2012  . Chronic combined systolic and diastolic heart failure ([place]) 11/11/2012    Past Surgical History:  Procedure Laterality Date  . 2-D echocardiogram  11/20/2011   Ejection fraction 30-35% moderate concentric left ventricular hypertrophy. Left atrium is moderately dilated. Mild MR. Mild or  . BI-VENTRICULAR IMPLANTABLE CARDIOVERTER DEFIBRILLATOR N/A 12/16/2012   Procedure: BI-VENTRICULAR IMPLANTABLE CARDIOVERTER DEFIBRILLATOR  (CRT-D);  Surgeon: [name], MD;  Location: [facility] CATH LAB;  Service: Cardiovascular;  Laterality: N/A;  . CARDIAC CATHETERIZATION  12/10/2011   SVG to OM widely patent.  LIMA to LAD patent  . CATARACT EXTRACTION W/PHACO Right 10/12/2017   Procedure: CATARACT EXTRACTION PHACO AND INTRAOCULAR LENS PLACEMENT (IOC);  Surgeon: Birder Robson, MD;  Location: [facility] ORS;  Service: Ophthalmology;  Laterality: Right;  [place] 00:57 AP% 15.9 CDE 9.07 Fluid pack lot # [id number]  . COLONOSCOPY N/A 07/13/2018   Procedure: COLONOSCOPY;  Surgeon: Toledo, Benay Pike, MD;  Location: [facility] ENDOSCOPY;  Service: Gastroenterology;  Laterality: N/A;  . CORONARY ARTERY BYPASS GRAFT  1986  . ENTEROSCOPY N/A 09/14/2018   Procedure: ENTEROSCOPY;  Surgeon: Toledo, Benay Pike, MD;  Location: [facility] ENDOSCOPY;  Service: Gastroenterology;  Laterality: N/A;  symptomatic anemia, GI blood loss anemia, melena, positive small bowel capsule endoscopy showing source of bleeding   . ESOPHAGOGASTRODUODENOSCOPY N/A 07/13/2018   Procedure: ESOPHAGOGASTRODUODENOSCOPY (EGD);  Surgeon: Toledo, Benay Pike, MD;  Location: [facility] ENDOSCOPY;  Service: Gastroenterology;  Laterality: N/A;  . ESOPHAGOGASTRODUODENOSCOPY N/A 09/14/2018   Procedure: ESOPHAGOGASTRODUODENOSCOPY (EGD);  Surgeon: Toledo, Benay Pike, MD;  Location: [facility] ENDOSCOPY;  Service: Gastroenterology;  Laterality: N/A;  SIGN LANAGUAGE INTERPRETER  . ESOPHAGOGASTRODUODENOSCOPY (EGD) WITH PROPOFOL N/A 05/27/2018   Procedure:  ESOPHAGOGASTRODUODENOSCOPY (EGD) WITH PROPOFOL;  Surgeon: Clarene Essex, MD;  Location: [place];  Service: Endoscopy;  Laterality: N/A;  . INSERT / REPLACE / REMOVE PACEMAKER    . LEFT HEART CATHETERIZATION WITH CORONARY/GRAFT ANGIOGRAM N/A 12/10/2011   Procedure: LEFT HEART CATHETERIZATION WITH Beatrix Fetters;  Surgeon: Sanda Klein, MD;  Location: [place] CATH LAB;  Service: Cardiovascular;  Laterality: N/A;  . LEFT HEART CATHETERIZATION WITH CORONARY/GRAFT ANGIOGRAM N/A 09/25/2013   Procedure: LEFT HEART CATHETERIZATION WITH Beatrix Fetters;  Surgeon: Blane Ohara, MD;  Location: [facility] CATH LAB;  Service: Cardiovascular;  Laterality: N/A;  . Persantine Myoview  05/06/2010   Post-rest ejection fraction 30%. No significant ischemia demonstrated. Compared to previous study there is no significant change.  . TRANSURETHRAL RESECTION OF PROSTATE     s/p    Allergies Entresto [sacubitril-valsartan], Phenazopyridine, and Ramipril  Social History Social History   Tobacco Use  . Smoking status: Former Smoker    Quit date: 03/15/1985    Years since quitting: 34.2  . Smokeless tobacco: Never Used  Substance Use Topics  . Alcohol use: No    Comment: occas.  . Drug use: No    Review of Systems Constitutional: Positive for fever Cardiovascular: Negative for chest pain. Respiratory: Negative for shortness of breath. Gastrointestinal: Negative for abdominal pain, positive for vomiting and diarrhea Musculoskeletal: Negative for back pain. Skin: Negative for rash. Neurological: Negative for headaches, focal weakness or numbness.  All systems negative/normal/unremarkable except as stated in the HPI  ____________________________________________   PHYSICAL EXAM:  VITAL SIGNS: ED Triage  Vitals  Enc Vitals Group     BP      Pulse      Resp      Temp      Temp src      SpO2      Weight      Height      Head Circumference      Peak Flow      Pain Score      Pain  Loc      Pain Edu?      Excl. in [place]?     Constitutional: Alert and oriented. Well appearing and in no distress. Eyes: Conjunctivae are normal. Normal extraocular movements. Cardiovascular: Normal rate, regular rhythm. No murmurs, rubs, or gallops. Respiratory: Normal respiratory effort without tachypnea nor retractions. Breath sounds are clear and equal bilaterally. No wheezes/rales/rhonchi. Gastrointestinal: Soft and nontender. Normal bowel sounds Musculoskeletal: Nontender with normal range of motion in extremities.  Pitting edema is noted on lower extremities Neurologic:  Normal speech and language. No gross focal neurologic deficits are appreciated.  Skin:  Skin is warm, dry and intact. No rash noted. Psychiatric: Mood and affect are normal. Speech and behavior are normal.  ____________________________________________  EKG: Interpreted by me.  Ventricular paced rhythm with a rate of 71 bpm  ____________________________________________  ED COURSE:  As part of my medical decision making, I reviewed the following data within the [place] History obtained from family if available, nursing notes, old chart and ekg, as well as notes from prior ED visits. Patient presented for fever, vomiting, diarrhea and weakness, we will assess with labs and imaging as indicated at this time. Clinical Course as of Jun 15 2252  Fri Jun 16, 2019  2153 Patient has heart failure and cannot receive the full 30 cc/kg bolus   [JW]    Clinical Course User Index [JW] Earleen Newport, MD   Procedures  Thomas [name] was evaluated in Emergency Department on 06/16/2019 for the symptoms described in the history of present illness. He was evaluated in the context of the global COVID-19 pandemic, which necessitated consideration that the patient might be at risk for infection with the SARS-CoV-2 virus that causes COVID-19. Institutional protocols and algorithms that pertain to the evaluation  of patients at risk for COVID-19 are in a state of rapid change based on information released by regulatory bodies including the CDC and federal and state organizations. These policies and algorithms were followed during the patient's care in the ED.  ____________________________________________   LABS (pertinent positives/negatives)  Labs Reviewed  LACTIC ACID, PLASMA - Abnormal; Notable for the following components:      Result Value   Lactic Acid, Venous 4.6 (*)    All other components within normal limits  COMPREHENSIVE METABOLIC PANEL - Abnormal; Notable for the following components:   Potassium 3.3 (*)    Glucose, Bld 118 (*)    BUN 32 (*)    Creatinine, Ser 1.76 (*)    Calcium 8.1 (*)    Total Protein 5.2 (*)    Albumin 2.1 (*)    AST 51 (*)    GFR calc non Af Amer 35 (*)    GFR calc Af Amer 41 (*)    All other components within normal limits  CBC WITH DIFFERENTIAL/PLATELET - Abnormal; Notable for the following components:   WBC 17.9 (*)    RBC 3.55 (*)    Hemoglobin 8.5 (*)    HCT 27.7 (*)  MCV 78.0 (*)    MCH 23.9 (*)    RDW 20.8 (*)    Neutro Abs 16.1 (*)    Lymphs Abs 0.6 (*)    Abs Immature Granulocytes 0.23 (*)    All other components within normal limits  APTT - Abnormal; Notable for the following components:   aPTT 43 (*)    All other components within normal limits  PROTIME-INR - Abnormal; Notable for the following components:   Prothrombin Time 17.2 (*)    INR 1.4 (*)    All other components within normal limits  CULTURE, BLOOD (ROUTINE X 2)  CULTURE, BLOOD (ROUTINE X 2)  URINE CULTURE  RESPIRATORY PANEL BY RT PCR (FLU A&B, COVID)  LACTIC ACID, PLASMA  URINALYSIS, ROUTINE W REFLEX MICROSCOPIC    RADIOLOGY Images were viewed by me  Chest x-ray IMPRESSION:  Stable changes of vascular congestion. No acute abnormality noted.  ____________________________________________   DIFFERENTIAL DIAGNOSIS   Sepsis, dehydration, electrolyte abnormality,  gastroenteritis, COVID-19  FINAL ASSESSMENT AND PLAN  Fever, sepsis   Plan: The patient had presented for fever, vomiting and diarrhea with weakness. Patient's labs did indicate significant leukocytosis with a left shift and elevated lactic acidosis concerning for sepsis.  As dictated above he cannot receive the full fluid bolus due to congestive heart failure history.  Patient's imaging did not reveal any acute process.  He has received broad-spectrum antibiotics, I will discuss with the hospitalist for admission.   Laurence Aly, MD    Note: This note was generated in part or whole with voice recognition software. Voice recognition is usually quite accurate but there are transcription errors that can and very often do occur. I apologize for any typographical errors that were not detected and corrected.     Earleen Newport, MD 06/16/19 2255

## 2019-06-17 ENCOUNTER — Encounter: Payer: Self-pay | Admitting: Hospitalist

## 2019-06-17 ENCOUNTER — Inpatient Hospital Stay: Payer: PPO

## 2019-06-17 DIAGNOSIS — R6510 Systemic inflammatory response syndrome (SIRS) of non-infectious origin without acute organ dysfunction: Secondary | ICD-10-CM | POA: Diagnosis not present

## 2019-06-17 DIAGNOSIS — B951 Streptococcus, group B, as the cause of diseases classified elsewhere: Secondary | ICD-10-CM | POA: Diagnosis present

## 2019-06-17 DIAGNOSIS — R7881 Bacteremia: Secondary | ICD-10-CM | POA: Diagnosis present

## 2019-06-17 LAB — BLOOD CULTURE ID PANEL (REFLEXED)

## 2019-06-17 LAB — CBC
HCT: 24.6 % — ABNORMAL LOW (ref 39.0–52.0)
Hemoglobin: 7.5 g/dL — ABNORMAL LOW (ref 13.0–17.0)
MCH: 23.8 pg — ABNORMAL LOW (ref 26.0–34.0)
MCHC: 30.5 g/dL (ref 30.0–36.0)
MCV: 78.1 fL — ABNORMAL LOW (ref 80.0–100.0)
Platelets: 172 10*3/uL (ref 150–400)
RBC: 3.15 MIL/uL — ABNORMAL LOW (ref 4.22–5.81)
RDW: 20.6 % — ABNORMAL HIGH (ref 11.5–15.5)
WBC: 16.2 10*3/uL — ABNORMAL HIGH (ref 4.0–10.5)
nRBC: 0 % (ref 0.0–0.2)

## 2019-06-17 LAB — COMPREHENSIVE METABOLIC PANEL
ALT: 15 U/L (ref 0–44)
AST: 44 U/L — ABNORMAL HIGH (ref 15–41)
Albumin: 1.7 g/dL — ABNORMAL LOW (ref 3.5–5.0)
Alkaline Phosphatase: 80 U/L (ref 38–126)
Anion gap: 9 (ref 5–15)
BUN: 36 mg/dL — ABNORMAL HIGH (ref 8–23)
CO2: 26 mmol/L (ref 22–32)
Calcium: 7.4 mg/dL — ABNORMAL LOW (ref 8.9–10.3)
Chloride: 104 mmol/L (ref 98–111)
Creatinine, Ser: 1.57 mg/dL — ABNORMAL HIGH (ref 0.61–1.24)
GFR calc Af Amer: 47 mL/min — ABNORMAL LOW (ref >60)
GFR calc non Af Amer: 40 mL/min — ABNORMAL LOW (ref >60)
Glucose, Bld: 127 mg/dL — ABNORMAL HIGH (ref 70–99)
Potassium: 3.3 mmol/L — ABNORMAL LOW (ref 3.5–5.1)
Sodium: 139 mmol/L (ref 135–145)
Total Bilirubin: 1.1 mg/dL (ref 0.3–1.2)
Total Protein: 4.4 g/dL — ABNORMAL LOW (ref 6.5–8.1)

## 2019-06-17 LAB — C DIFFICILE QUICK SCREEN W PCR REFLEX
C Diff antigen: NEGATIVE
C Diff interpretation: NOT DETECTED
C Diff toxin: NEGATIVE

## 2019-06-17 LAB — BRAIN NATRIURETIC PEPTIDE: B Natriuretic Peptide: 1390 pg/mL — ABNORMAL HIGH (ref 0.0–100.0)

## 2019-06-17 LAB — LACTIC ACID, PLASMA
Lactic Acid, Venous: 1 mmol/L (ref 0.5–1.9)
Lactic Acid, Venous: 3.5 mmol/L (ref 0.5–1.9)

## 2019-06-17 LAB — MAGNESIUM: Magnesium: 1.8 mg/dL (ref 1.7–2.4)

## 2019-06-17 LAB — LIPASE, BLOOD: Lipase: 17 U/L (ref 11–51)

## 2019-06-17 MED ORDER — ACETAMINOPHEN 325 MG PO TABS
650.0000 mg | ORAL_TABLET | Freq: Four times a day (QID) | ORAL | Status: DC | PRN
  Administered 2019-06-17 – 2019-06-21 (×2): 650 mg via ORAL
  Filled 2019-06-17 (×3): qty 2

## 2019-06-17 MED ORDER — ALBUTEROL SULFATE HFA 108 (90 BASE) MCG/ACT IN AERS
2.0000 | INHALATION_SPRAY | Freq: Four times a day (QID) | RESPIRATORY_TRACT | Status: DC | PRN
  Filled 2019-06-17: qty 6.7

## 2019-06-17 MED ORDER — ONDANSETRON HCL 4 MG/2ML IJ SOLN
4.0000 mg | Freq: Once | INTRAMUSCULAR | Status: AC
  Administered 2019-06-17: 02:00:00 4 mg via INTRAVENOUS

## 2019-06-17 MED ORDER — HEPARIN SODIUM (PORCINE) 5000 UNIT/ML IJ SOLN
5000.0000 [IU] | Freq: Three times a day (TID) | INTRAMUSCULAR | Status: DC
  Administered 2019-06-17 (×3): 5000 [IU] via SUBCUTANEOUS
  Filled 2019-06-17 (×3): qty 1

## 2019-06-17 MED ORDER — CARVEDILOL 6.25 MG PO TABS: 3.1250 mg | ORAL_TABLET | Freq: Two times a day (BID) | ORAL | Status: DC

## 2019-06-17 MED ORDER — VANCOMYCIN HCL IN DEXTROSE 1-5 GM/200ML-% IV SOLN
1000.0000 mg | INTRAVENOUS | Status: DC
  Administered 2019-06-17 – 2019-06-18 (×2): 1000 mg via INTRAVENOUS
  Filled 2019-06-17 (×2): qty 200

## 2019-06-17 MED ORDER — POTASSIUM CHLORIDE IN NACL 20-0.9 MEQ/L-% IV SOLN
INTRAVENOUS | Status: DC
  Filled 2019-06-17 (×4): qty 1000

## 2019-06-17 MED ORDER — PANTOPRAZOLE SODIUM 40 MG PO TBEC
40.0000 mg | DELAYED_RELEASE_TABLET | Freq: Every day | ORAL | Status: DC
  Administered 2019-06-17 – 2019-06-25 (×9): 40 mg via ORAL
  Filled 2019-06-17 (×9): qty 1

## 2019-06-17 MED ORDER — ONDANSETRON HCL 4 MG/2ML IJ SOLN
INTRAMUSCULAR | Status: AC
  Filled 2019-06-17: qty 2

## 2019-06-17 MED ORDER — SODIUM CHLORIDE 0.9 % IV BOLUS
1000.0000 mL | Freq: Once | INTRAVENOUS | Status: AC
  Administered 2019-06-17: 1000 mL via INTRAVENOUS

## 2019-06-17 NOTE — ED Notes (Signed)
Patient incontinent of liquid stool. Patient changed and repositioned at this time.

## 2019-06-17 NOTE — Progress Notes (Signed)
PHARMACY - PHYSICIAN COMMUNICATION CRITICAL VALUE ALERT - BLOOD CULTURE IDENTIFICATION (BCID)  Thomas [name] is an 85 y.o. male who presented to [facility] on 06/16/2019 with a chief complaint of sepsis  Assessment:  Lab reports 2 of 4 bottles + w/ GPC, will be sent to [facility] for further w/u  Name of physician (or Provider) Contacted: Jonny Ruiz, NP  Current antibiotics: Vancomycin and Zosyn  Changes to prescribed antibiotics recommended:  Patient is on recommended antibiotics - No changes needed  No results found for this or any previous visit.  [name] 06/17/2019  4:41 AM

## 2019-06-17 NOTE — ED Notes (Signed)
Attempted to call report- inpatient RN busy. Will call back.

## 2019-06-17 NOTE — ED Notes (Signed)
Pt provided urinal, is asleep in bed. Mucous membranes are noted to be dry. Pt refuses oral liquids.

## 2019-06-17 NOTE — ED Notes (Signed)
Dr. Lai at bedside

## 2019-06-17 NOTE — ED Notes (Signed)
Pt given TV remote

## 2019-06-17 NOTE — ED Notes (Signed)
Dr Billie Ruddy aware of bloody diarrhea

## 2019-06-17 NOTE — ED Notes (Signed)
Pt with wet brief and some bloody diarrhea. Linens, gown and brief changed and new chux applied. Pt repositioned for comfort.

## 2019-06-17 NOTE — ED Notes (Signed)
Patient to Ct scan

## 2019-06-17 NOTE — ED Notes (Signed)
Pt changed of bloody diarrhea and clean gown placed on pt

## 2019-06-17 NOTE — ED Provider Notes (Addendum)
Procedures  Clinical Course as of Jun 16 213  Fri Jun 16, 2019  2153 Patient has heart failure and cannot receive the full 30 cc/kg bolus   [JW]    Clinical Course User Index [JW] Earleen Newport, MD    ----------------------------------------- 2:15 AM on 06/17/2019 ----------------------------------------- Assumed care from Dr. [name] in the ED at shift change at 11:00pm. With lactate > 4 suggesting septic shock, I ordered additional 2L IVF bolus on top of 1L bolus he had already received, with plan to monitor clinically for signs of CHF or respiratory failure.  Pt has had 2L bolus so far, and 3rd liter appears to have been changed to an infusion with K at 75 ml/hr.  Sent message to hospitalist team regarding fluid orders.  Lactate has improved to 3.5.  Sepsis reassessment has been completed at this time.    Carrie Mew, MD 06/17/19 0217  ----------------------------------------- 5:53 AM on 06/17/2019 -----------------------------------------  Third liter IV fluid bolus has been completed.  Sepsis reassessment has been completed at this time.  Still breathing comfortably, normal oxygenation, no signs of worsened heart failure or pulmonary edema.  Blood pressure appears normal.    Carrie Mew, MD 06/17/19 [phone]

## 2019-06-17 NOTE — Plan of Care (Signed)
Continuing with plan of care. 

## 2019-06-17 NOTE — Progress Notes (Signed)
PROGRESS NOTE    FARUK MCNEIL  [id number] DOB: 1934/03/26 DOA: 06/16/2019 PCP: [name], MD    Assessment & Plan:   Principal Problem:   SIRS (systemic inflammatory response syndrome) ([place]) Active Problems:   Cardiomyopathy, ischemic   CAD Status post coronary artery bypass grafting: 1995   Permanent atrial fibrillation ([place])   Biventricular ICD in place   Benign essential HTN   CAD S/P multiple PCIs   Essential hypertension   CRI (chronic renal insufficiency), stage 3 (moderate)   Anasarca    JAYMISON DENSMORE is a 85 y.o. male with medical history significant for chronic a-fib, sCHF (ef 35-40%), ICD/pacemaker, htn, deaf, ckd3, anasarca, GIB 2/2 AVMs, CVA, and newly identified possible cirrhosis, presenting with chills, vomiting and diarrhea.   # Sepsis 2/2 Strep bacteremia - WBCs elevated, lactate elevated, with mild aki, and febrile.  No signs cellulitis. CXR clear. covid and flu negative. Urinalysis not suggestive of infection. No report of IV drug use.  CT a/p wo showed no acute finding. --started on vanc/zosyn on presentation. --2 of 4 bottles grew Strep PLAN: - continue vanc/zosyn for now - judicious fluids given sCHF, ordered @ 75/hr - repeat lactate   # Systolic heart failure - recent ef 35-40%.  Wt today (201) is same as at cardiologist's 2 wks ago, d/c wt was 199, and wife reports no change in edema, no pulmonary edema on exam. Thus do not think sig chf exacerbation at this point. Has pacer/icd. PLAN: - Hold carvedilol for now due to hypotension  - hold torsemide for now given signs dehydration; gentle fluid resuscitation as above - daily weights, I/Os  # Hypoxia, resolved  - appears to be 2/2 acute illness. o2 monitor also with difficulty tracing. cxr clear, do not think sig fluid overload.   # hypokalemia - mild --3.3. likely 2/2 combination diuretics and GI losses.  - ns w/ 20 of KCl - f/u Mg level  # Acute kidney injury - likely prerenal  2/2 sirs. Cr 1.76 from baseline of around 1.3. - treat as above  # Anemia - chronic,  --baseline 7-8 - monitor  # Deaf  - interpreter used  # chronic a-fib - recent d/c of anticoagulation - Hold carvedilol for now due to hypotension HR low  # Stage 1 pressure ulcer - nursing wound care order  # bloody diarrhea --1 episode noted this morning --Monitor Hgb   DVT prophylaxis: SCD/Compression stockings Code Status: Full code  Family Communication:  Disposition Plan: Undetermined, still workup sepsis, still on broad-spectrum IV abx.   Subjective and Interval History:  Pt indicated with a thumbs up that he was doing ok, however, pointed to his legs which were grossly swollen.  Nursing noted 1 episode of bloody diarrhea this morning.  No N/V.     Objective: Vitals:   06/17/19 1200 06/17/19 1214 06/17/19 1300 06/17/19 1500  BP: (!) 98/58  100/60 (!) 91/59  Pulse: 61  (!) 59 63  Resp: 16  16 12   Temp:  (!) 97.5 F (36.4 C)    TempSrc:  Oral    SpO2: 100%  100% 100%  Weight:      Height:        Intake/Output Summary (Last 24 hours) at 06/17/2019 1536 Last data filed at 06/17/2019 0913 Gross per 24 hour  Intake 2298.89 ml  Output --  Net 2298.89 ml   Filed Weights   06/16/19 2059  Weight: 91.2 kg    Examination:  Constitutional: NAD, alert HEENT: conjunctivae and lids normal, EOMI, deaf CV: RRR no M,R,G. Distal pulses +2.  No cyanosis.   RESP: CTA B/L, normal respiratory effort  GI: +BS, NTND Extremities: Lumpy, uneven pitting edema in BLE SKIN: warm, dry and intact Neuro: II - XII grossly intact.  Sensation intact Psych: Normal mood and affect.     Data Reviewed: I have personally reviewed following labs and imaging studies  CBC: Recent Labs  Lab 06/16/19 2052 06/17/19 0321  WBC 17.9* 16.2*  NEUTROABS 16.1*  --   HGB 8.5* 7.5*  HCT 27.7* 24.6*  MCV 78.0* 78.1*  PLT 206 [id number]   Basic Metabolic Panel: Recent Labs  Lab 06/16/19 2052  06/17/19 0321  NA 140 139  K 3.3* 3.3*  CL 102 104  CO2 26 26  GLUCOSE 118* 127*  BUN 32* 36*  CREATININE 1.76* 1.57*  CALCIUM 8.1* 7.4*  MG  --  1.8   GFR: Estimated Creatinine Clearance: 42.2 mL/min (A) (by C-G formula based on SCr of 1.57 mg/dL (H)). Liver Function Tests: Recent Labs  Lab 06/16/19 2052 06/17/19 0321  AST 51* 44*  ALT 17 15  ALKPHOS 100 80  BILITOT 1.1 1.1  PROT 5.2* 4.4*  ALBUMIN 2.1* 1.7*   Recent Labs  Lab 06/16/19 2052  LIPASE 17   No results for input(s): AMMONIA in the last 168 hours. Coagulation Profile: Recent Labs  Lab 06/16/19 2052  INR 1.4*   Cardiac Enzymes: No results for input(s): CKTOTAL, CKMB, CKMBINDEX, TROPONINI in the last 168 hours. BNP (last 3 results) No results for input(s): PROBNP in the last 8760 hours. HbA1C: No results for input(s): HGBA1C in the last 72 hours. CBG: No results for input(s): GLUCAP in the last 168 hours. Lipid Profile: No results for input(s): CHOL, HDL, LDLCALC, TRIG, CHOLHDL, LDLDIRECT in the last 72 hours. Thyroid Function Tests: No results for input(s): TSH, T4TOTAL, FREET4, T3FREE, THYROIDAB in the last 72 hours. Anemia Panel: No results for input(s): VITAMINB12, FOLATE, FERRITIN, TIBC, IRON, RETICCTPCT in the last 72 hours. Sepsis Labs: Recent Labs  Lab 06/16/19 2052 06/16/19 2354  LATICACIDVEN 4.6* 3.5*    Recent Results (from the past 240 hour(s))  C Difficile Quick Screen w PCR reflex     Status: None   Collection Time: 06/16/19  2:06 AM   Specimen: STOOL  Result Value Ref Range Status   C Diff antigen NEGATIVE NEGATIVE Final   C Diff toxin NEGATIVE NEGATIVE Final   C Diff interpretation No C. difficile detected.  Final    Comment: Performed at [facility], [address] [place], [place] [address]  Blood Culture (routine x 2)     Status: None (Preliminary result)   Collection Time: 06/16/19  8:52 PM   Specimen: BLOOD  Result Value Ref Range Status   Specimen  Description   Final    BLOOD LEFT FA Performed at [facility], [address] [place], [place] [address]    Special Requests   Final    BOTTLES DRAWN AEROBIC AND ANAEROBIC Blood Culture adequate volume Performed at [facility], [address] [place], [place] [address]    Culture  Setup Time   Final    IN BOTH AEROBIC AND ANAEROBIC BOTTLES GRAM POSITIVE COCCI IN PAIRS CRITICAL RESULT CALLED TO, READ BACK BY AND VERIFIED WITH: [place] ON 06/17/19 AT [facility] Performed at [facility], [place] [address] [place], [place] [address]    Culture  GRAM POSITIVE COCCI  Final   Report Status PENDING  Incomplete  Respiratory Panel by RT PCR (Flu A&B, Covid) - Nasopharyngeal Swab     Status: None   Collection Time: 06/16/19 10:02 PM   Specimen: Nasopharyngeal Swab  Result Value Ref Range Status   SARS Coronavirus 2 by RT PCR NEGATIVE NEGATIVE Final    Comment: (NOTE) SARS-CoV-2 target nucleic acids are NOT DETECTED. The SARS-CoV-2 RNA is generally detectable in upper respiratoy specimens during the acute phase of infection. The lowest concentration of SARS-CoV-2 viral copies this assay can detect is 131 copies/mL. A negative result does not preclude SARS-Cov-2 infection and should not be used as the sole basis for treatment or other patient management decisions. A negative result may occur with  improper specimen collection/handling, submission of specimen other than nasopharyngeal swab, presence of viral mutation(s) within the areas targeted by this assay, and inadequate number of viral copies (<131 copies/mL). A negative result must be combined with clinical observations, patient history, and epidemiological information. The expected result is Negative. Fact Sheet for Patients:  PinkCheek.be Fact Sheet for Healthcare Providers:  GravelBags.it This test is not yet ap proved or cleared by the [place]  FDA and  has been authorized for detection and/or diagnosis of SARS-CoV-2 by FDA under an Emergency Use Authorization (EUA). This EUA will remain  in effect (meaning this test can be used) for the duration of the COVID-19 declaration under Section 564(b)(1) of the Act, 21 U.S.C. section 360bbb-3(b)(1), unless the authorization is terminated or revoked sooner.    Influenza A by PCR NEGATIVE NEGATIVE Final   Influenza B by PCR NEGATIVE NEGATIVE Final    Comment: (NOTE) The Xpert Xpress SARS-CoV-2/FLU/RSV assay is intended as an aid in  the diagnosis of influenza from Nasopharyngeal swab specimens and  should not be used as a sole basis for treatment. Nasal washings and  aspirates are unacceptable for Xpert Xpress SARS-CoV-2/FLU/RSV  testing. Fact Sheet for Patients: PinkCheek.be Fact Sheet for Healthcare Providers: GravelBags.it This test is not yet approved or cleared by the [place] FDA and  has been authorized for detection and/or diagnosis of SARS-CoV-2 by  FDA under an Emergency Use Authorization (EUA). This EUA will remain  in effect (meaning this test can be used) for the duration of the  Covid-19 declaration under Section 564(b)(1) of the Act, 21  U.S.C. section 360bbb-3(b)(1), unless the authorization is  terminated or revoked. Performed at [facility], [address] [place], [place] [address]   Blood Culture (routine x 2)     Status: None (Preliminary result)   Collection Time: 06/16/19 10:03 PM   Specimen: BLOOD  Result Value Ref Range Status   Specimen Description   Final    BLOOD LEFT FA Performed at [facility], [address], [place], [place] [address]    Special Requests   Final    BOTTLES DRAWN AEROBIC AND ANAEROBIC Blood Culture adequate volume Performed at [facility], [address] [place], [place] [address]    Culture  Setup Time   Final    Organism ID to  follow IN BOTH AEROBIC AND ANAEROBIC BOTTLES GRAM POSITIVE COCCI IN PAIRS CRITICAL VALUE NOTED.  VALUE IS CONSISTENT WITH PREVIOUSLY REPORTED AND CALLED VALUE. Performed at [facility], [place] [address], [place],  [address]    Culture [facility] POSITIVE COCCI  Final   Report Status PENDING  Incomplete  Blood Culture ID Panel (Reflexed)  Status: Abnormal   Collection Time: 06/16/19 10:03 PM  Result Value Ref Range Status   Enterococcus species NOT DETECTED NOT DETECTED Final   Listeria monocytogenes NOT DETECTED NOT DETECTED Final   Staphylococcus species NOT DETECTED NOT DETECTED Final   Staphylococcus aureus (BCID) NOT DETECTED NOT DETECTED Final   Streptococcus species DETECTED (A) NOT DETECTED Final    Comment: CRITICAL RESULT CALLED TO, READ BACK BY AND VERIFIED WITH: SCOTT HALL ON 06/17/19 AT 0608 [facility]    Streptococcus agalactiae DETECTED (A) NOT DETECTED Final    Comment: CRITICAL RESULT CALLED TO, READ BACK BY AND VERIFIED WITH: SCOTT HALL ON 06/17/19 AT 0608 [facility]    Streptococcus pneumoniae NOT DETECTED NOT DETECTED Final   Streptococcus pyogenes NOT DETECTED NOT DETECTED Final   Acinetobacter baumannii NOT DETECTED NOT DETECTED Final   Enterobacteriaceae species NOT DETECTED NOT DETECTED Final   Enterobacter cloacae complex NOT DETECTED NOT DETECTED Final   Escherichia coli NOT DETECTED NOT DETECTED Final   Klebsiella oxytoca NOT DETECTED NOT DETECTED Final   Klebsiella pneumoniae NOT DETECTED NOT DETECTED Final   Proteus species NOT DETECTED NOT DETECTED Final   Serratia marcescens NOT DETECTED NOT DETECTED Final   Haemophilus influenzae NOT DETECTED NOT DETECTED Final   Neisseria meningitidis NOT DETECTED NOT DETECTED Final   Pseudomonas aeruginosa NOT DETECTED NOT DETECTED Final   Candida albicans NOT DETECTED NOT DETECTED Final   Candida glabrata NOT DETECTED NOT DETECTED Final   Candida krusei NOT DETECTED NOT DETECTED Final   Candida parapsilosis NOT DETECTED  NOT DETECTED Final   Candida tropicalis NOT DETECTED NOT DETECTED Final    Comment: Performed at [facility], [address], [place], [place] [address]      Radiology Studies: CT ABDOMEN PELVIS WO CONTRAST  Result Date: 06/17/2019 CLINICAL DATA:  Vomiting. Diarrhea. EXAM: CT ABDOMEN AND PELVIS WITHOUT CONTRAST TECHNIQUE: Multidetector CT imaging of the abdomen and pelvis was performed following the standard protocol without IV contrast. COMPARISON:  Aug 14, 2018 FINDINGS: Lower chest: There are moderate-sized bilateral pleural effusions with adjacent atelectasis.There is massive cardiomegaly. Hepatobiliary: The liver surface appears somewhat nodular. Normal gallbladder.There is no biliary ductal dilation. Pancreas: Normal contours without ductal dilatation. No peripancreatic fluid collection. Spleen: The spleen is borderline enlarged. Adrenals/Urinary Tract: --Adrenal glands: No adrenal hemorrhage. --Right kidney/ureter: There is extensive scarring throughout the right kidney without evidence for hydronephrosis or radiopaque obstructing kidney stone. --Left kidney/ureter: There is no left-sided hydronephrosis. --Urinary bladder: Unremarkable. Stomach/Bowel: --Stomach/Duodenum: No hiatal hernia or other gastric abnormality. Normal duodenal course and caliber. --Small bowel: No dilatation or inflammation. --Colon: There is scattered colonic diverticula without CT evidence for diverticulitis. --Appendix: Normal. Vascular/Lymphatic: Extensive atherosclerotic changes are noted. The patient is status post EVAR. --No retroperitoneal lymphadenopathy. --No mesenteric lymphadenopathy. --No pelvic or inguinal lymphadenopathy. Reproductive: Unremarkable Other: No ascites or free air. The abdominal wall is normal. Musculoskeletal. No acute displaced fractures. IMPRESSION: 1. No acute abnormality detected within the abdomen or pelvis. 2. Cardiomegaly with moderate-sized bilateral pleural effusions. 3.  Cirrhosis 4. There are additional chronic findings as detailed above. Aortic Atherosclerosis (ICD10-I70.0). Electronically Signed   By: Constance Holster M.D.   On: 06/17/2019 03:09   DG Chest Port 1 View  Result Date: 06/16/2019 CLINICAL DATA:  Fevers and lethargy EXAM: PORTABLE CHEST 1 VIEW COMPARISON:  05/19/2019 FINDINGS: Cardiac shadow is enlarged in size but stable. Defibrillator is again seen and stable. Postsurgical changes are again noted. Aortic calcifications are seen. Vascular congestion is again  identified with mild edema. No focal infiltrate is seen. Stable left basilar atelectasis is noted. No acute bony abnormality is seen. IMPRESSION: Stable changes of vascular congestion.  No acute abnormality noted. Electronically Signed   By: Inez Catalina M.D.   On: 06/16/2019 21:39     Scheduled Meds:  carvedilol  3.125 mg Oral BID WC   heparin  5,000 Units Subcutaneous Q8H   pantoprazole  40 mg Oral Daily   Continuous Infusions:  0.9 % NaCl with KCl 20 mEq / L 75 mL/hr at 06/17/19 1401   piperacillin-tazobactam (ZOSYN)  IV 3.375 g (06/17/19 1406)   vancomycin 1,000 mg (06/17/19 1515)     LOS: 1 day     Enzo Bi, MD [facility] If 7PM-7AM, please contact night-coverage 06/17/2019, 3:36 PM

## 2019-06-17 NOTE — Progress Notes (Signed)
Pharmacy Antibiotic Note  Thomas [name] is a 85 y.o. male admitted on 06/16/2019 with sepsis.  Pharmacy has been consulted for Vancomycin and Zosyn dosing.  Plan: Zosyn 3.375g IV q8h (4 hour infusion).   Vancomycin 1000 mg IV Q 18 hrs. Goal AUC 400-550. Expected AUC: 515.3, Css 15.3 SCr used: 1.57  Height: 6\' 2"  (188 cm) Weight: 91.2 kg (201 lb) IBW/kg (Calculated) : 82.2  Temp (24hrs), Avg:101.4 F (38.6 C), Min:101.4 F (38.6 C), Max:101.4 F (38.6 C)  Recent Labs  Lab 06/16/19 2052 06/16/19 2354 06/17/19 0321  WBC 17.9*  --  16.2*  CREATININE 1.76*  --  1.57*  LATICACIDVEN 4.6* 3.5*  --     Estimated Creatinine Clearance: 42.2 mL/min (A) (by C-G formula based on SCr of 1.57 mg/dL (H)).    Allergies  Allergen Reactions  . Entresto [Sacubitril-Valsartan] Swelling    And bruising of arm  . Phenazopyridine Nausea Only and Other (See Comments)    GI UPSET  . Ramipril Other (See Comments)    unk Other reaction(s): Other (See Comments), Unknown unk    Antimicrobials this admission:   >>    >>   Dose adjustments this admission:   Microbiology results:  BCx:   UCx:    Sputum:    MRSA PCR:   Thank you for allowing pharmacy to be a part of this patient's care.  [name] 06/17/2019 4:15 AM

## 2019-06-18 DIAGNOSIS — L8992 Pressure ulcer of unspecified site, stage 2: Secondary | ICD-10-CM | POA: Diagnosis present

## 2019-06-18 DIAGNOSIS — N179 Acute kidney failure, unspecified: Secondary | ICD-10-CM | POA: Diagnosis present

## 2019-06-18 DIAGNOSIS — E872 Acidosis, unspecified: Secondary | ICD-10-CM | POA: Diagnosis present

## 2019-06-18 DIAGNOSIS — R8271 Bacteriuria: Secondary | ICD-10-CM | POA: Diagnosis present

## 2019-06-18 DIAGNOSIS — R6510 Systemic inflammatory response syndrome (SIRS) of non-infectious origin without acute organ dysfunction: Secondary | ICD-10-CM | POA: Diagnosis not present

## 2019-06-18 LAB — BASIC METABOLIC PANEL
Anion gap: 7 (ref 5–15)
BUN: 43 mg/dL — ABNORMAL HIGH (ref 8–23)
CO2: 26 mmol/L (ref 22–32)
Calcium: 7.4 mg/dL — ABNORMAL LOW (ref 8.9–10.3)
Chloride: 105 mmol/L (ref 98–111)
Creatinine, Ser: 1.57 mg/dL — ABNORMAL HIGH (ref 0.61–1.24)
GFR calc Af Amer: 47 mL/min — ABNORMAL LOW (ref >60)
GFR calc non Af Amer: 40 mL/min — ABNORMAL LOW (ref >60)
Glucose, Bld: 126 mg/dL — ABNORMAL HIGH (ref 70–99)
Potassium: 3.3 mmol/L — ABNORMAL LOW (ref 3.5–5.1)
Sodium: 138 mmol/L (ref 135–145)

## 2019-06-18 LAB — GASTROINTESTINAL PANEL BY PCR, STOOL (REPLACES STOOL CULTURE)

## 2019-06-18 LAB — CBC
HCT: 26.1 % — ABNORMAL LOW (ref 39.0–52.0)
Hemoglobin: 7.6 g/dL — ABNORMAL LOW (ref 13.0–17.0)
MCH: 23.8 pg — ABNORMAL LOW (ref 26.0–34.0)
MCHC: 29.1 g/dL — ABNORMAL LOW (ref 30.0–36.0)
MCV: 81.6 fL (ref 80.0–100.0)
Platelets: 162 10*3/uL (ref 150–400)
RBC: 3.2 MIL/uL — ABNORMAL LOW (ref 4.22–5.81)
RDW: 20.4 % — ABNORMAL HIGH (ref 11.5–15.5)
WBC: 13.6 10*3/uL — ABNORMAL HIGH (ref 4.0–10.5)
nRBC: 0 % (ref 0.0–0.2)

## 2019-06-18 LAB — URINE CULTURE: Culture: NO GROWTH

## 2019-06-18 LAB — MAGNESIUM: Magnesium: 2 mg/dL (ref 1.7–2.4)

## 2019-06-18 MED ORDER — PENICILLIN G POTASSIUM 5000000 UNITS IJ SOLR
3.0000 10*6.[IU] | INTRAVENOUS | Status: AC
  Administered 2019-06-18 – 2019-06-22 (×26): 3 10*6.[IU] via INTRAVENOUS
  Filled 2019-06-18 (×35): qty 3

## 2019-06-18 MED ORDER — PENICILLIN G POT IN DEXTROSE 60000 UNIT/ML IV SOLN
3.0000 10*6.[IU] | INTRAVENOUS | Status: DC
  Filled 2019-06-18 (×4): qty 50

## 2019-06-18 MED ORDER — POTASSIUM CHLORIDE CRYS ER 20 MEQ PO TBCR
40.0000 meq | EXTENDED_RELEASE_TABLET | Freq: Once | ORAL | Status: AC
  Administered 2019-06-18: 40 meq via ORAL
  Filled 2019-06-18: qty 2

## 2019-06-18 NOTE — Progress Notes (Signed)
Patient's left arm noted to have swelling and increased swelling in the bilateral lower extremities, Dr. Billie Ruddy aware, no new orders at this time, will continue to monitor and endorse to the oncoming shift.

## 2019-06-18 NOTE — Progress Notes (Addendum)
PROGRESS NOTE    Thomas [name]  [id number] DOB: [birth year] DOA: 06/16/2019 PCP: [name], MD    Assessment & Plan:   Principal Problem:   SIRS (systemic inflammatory response syndrome) ([place]) Active Problems:   Cardiomyopathy, ischemic   CAD Status post coronary artery bypass grafting: 1995   Permanent atrial fibrillation ([place])   Biventricular ICD in place   Benign essential HTN   CAD S/P multiple PCIs   Essential hypertension   CRI (chronic renal insufficiency), stage 3 (moderate)   Anasarca   Bacteremia    Thomas [name] is a 85 y.o. Caucasian male with medical history significant for chronic a-fib, sCHF (ef 35-40%), ICD/pacemaker, htn, deaf, ckd3, anasarca, GIB 2/2 AVMs, CVA, and newly identified possible cirrhosis, presenting with chills, vomiting and diarrhea.   # Sepsis 2/2 Group B Strep bacteremia # lactic acidosis, resolved - WBCs elevated, lactate elevated, with mild aki, and febrile.  No signs cellulitis. CXR clear. covid and flu negative. Urinalysis not suggestive of infection. No report of IV drug use.  CT a/p wo showed no acute finding. --started on vanc/zosyn on presentation. --2 of 4 bottles grew Strep AGALACTIAE PLAN: - De-escalate to Penicillin, per pharm rec - d/c MIVF --repeat blood cx x2 today --TTE to assess for endocarditis  # Systolic heart failure - recent ef 35-40%.  Wt (201) is same as at cardiologist's 2 wks ago, d/c wt was 199, and wife reports no change in edema, no pulmonary edema on exam. Thus do not think sig chf exacerbation at this point. Has pacer/icd. PLAN: - Hold carvedilol for now due to hypotension  - hold torsemide for now given signs dehydration and sepsis - daily weights, I/Os  # Hypoxia  - appears to be 2/2 acute illness. o2 monitor also with difficulty tracing. cxr clear, do not think sig fluid overload.   # hypokalemia - mild --3.3. likely 2/2 combination diuretics and GI losses.  --replete PRN  # Acute  kidney injury, improved - likely prerenal 2/2 sirs. Cr 1.76 from baseline of around 1.3. - treat as above  # Anemia - chronic,  --baseline 7-8 - monitor  # Deaf   # chronic a-fib - recent d/c of anticoagulation - Hold carvedilol for now due to hypotension HR low  # Skin excoriations on buttocks, not pressure ulcers yet.  # bloody diarrhea --1 episode noted this morning --Monitor Hgb   DVT prophylaxis: SCD/Compression stockings Code Status: Full code  Family Communication:  Disposition Plan: Will need to have repeat blood cx neg for 3 days before discharge, so may discharge in 3 days.   Subjective and Interval History:  Interview conducted via writing.  Pt had no complaints.  No fever, dyspnea, chest pain, abdominal pain, N/V/D.     Objective: Vitals:   06/18/19 0020 06/18/19 0351 06/18/19 0540 06/18/19 0701  BP: (!) 94/58 106/60    Pulse: (!) 58 (!) 58    Resp: 20 20    Temp: 97.7 F (36.5 C) 97.7 F (36.5 C)    TempSrc: Oral Oral    SpO2: 100% 96%  98%  Weight:   103.6 kg   Height:        Intake/Output Summary (Last 24 hours) at 06/18/2019 1451 Last data filed at 06/18/2019 1200 Gross per 24 hour  Intake 2710.9 ml  Output 400 ml  Net 2310.9 ml   Filed Weights   06/16/19 2059 06/18/19 0540  Weight: 91.2 kg 103.6 kg    Examination:  Constitutional: NAD, alert HEENT: conjunctivae and lids normal, EOMI, deaf CV: RRR no M,R,G. Distal pulses +2.  No cyanosis.   RESP: CTA B/L, normal respiratory effort  GI: +BS, NTND Extremities: Lumpy, uneven pitting edema in BLE.  Left arm more swollen. SKIN: warm, dry and intact Neuro: II - XII grossly intact.  Sensation intact Psych: Normal mood and affect.     Data Reviewed: I have personally reviewed following labs and imaging studies  CBC: Recent Labs  Lab 06/16/19 2052 06/17/19 0321 06/18/19 0444  WBC 17.9* 16.2* 13.6*  NEUTROABS 16.1*  --   --   HGB 8.5* 7.5* 7.6*  HCT 27.7* 24.6* 26.1*  MCV  78.0* 78.1* 81.6  PLT 206 172 0000000   Basic Metabolic Panel: Recent Labs  Lab 06/16/19 2052 06/17/19 0321 06/18/19 0444  NA 140 139 138  K 3.3* 3.3* 3.3*  CL 102 104 105  CO2 26 26 26   GLUCOSE 118* 127* 126*  BUN 32* 36* 43*  CREATININE 1.76* 1.57* 1.57*  CALCIUM 8.1* 7.4* 7.4*  MG  --  1.8 2.0   GFR: Estimated Creatinine Clearance: 46.6 mL/min (A) (by C-G formula based on SCr of 1.57 mg/dL (H)). Liver Function Tests: Recent Labs  Lab 06/16/19 2052 06/17/19 0321  AST 51* 44*  ALT 17 15  ALKPHOS 100 80  BILITOT 1.1 1.1  PROT 5.2* 4.4*  ALBUMIN 2.1* 1.7*   Recent Labs  Lab 06/16/19 2052  LIPASE 17   No results for input(s): AMMONIA in the last 168 hours. Coagulation Profile: Recent Labs  Lab 06/16/19 2052  INR 1.4*   Cardiac Enzymes: No results for input(s): CKTOTAL, CKMB, CKMBINDEX, TROPONINI in the last 168 hours. BNP (last 3 results) No results for input(s): PROBNP in the last 8760 hours. HbA1C: No results for input(s): HGBA1C in the last 72 hours. CBG: No results for input(s): GLUCAP in the last 168 hours. Lipid Profile: No results for input(s): CHOL, HDL, LDLCALC, TRIG, CHOLHDL, LDLDIRECT in the last 72 hours. Thyroid Function Tests: No results for input(s): TSH, T4TOTAL, FREET4, T3FREE, THYROIDAB in the last 72 hours. Anemia Panel: No results for input(s): VITAMINB12, FOLATE, FERRITIN, TIBC, IRON, RETICCTPCT in the last 72 hours. Sepsis Labs: Recent Labs  Lab 06/16/19 2052 06/16/19 2354 06/17/19 1754  LATICACIDVEN 4.6* 3.5* 1.0    Recent Results (from the past 240 hour(s))  C Difficile Quick Screen w PCR reflex     Status: None   Collection Time: 06/16/19  2:06 AM   Specimen: STOOL  Result Value Ref Range Status   C Diff antigen NEGATIVE NEGATIVE Final   C Diff toxin NEGATIVE NEGATIVE Final   C Diff interpretation No C. difficile detected.  Final    Comment: Performed at [facility], [address] [place], [place] [address]    Gastrointestinal Panel by PCR , Stool     Status: None   Collection Time: 06/16/19  2:06 AM  Result Value Ref Range Status   Campylobacter species NOT DETECTED NOT DETECTED Final   Plesimonas shigelloides NOT DETECTED NOT DETECTED Final   Salmonella species NOT DETECTED NOT DETECTED Final   Yersinia enterocolitica NOT DETECTED NOT DETECTED Final   Vibrio species NOT DETECTED NOT DETECTED Final   Vibrio cholerae NOT DETECTED NOT DETECTED Final   Enteroaggregative E coli (EAEC) NOT DETECTED NOT DETECTED Final   Enteropathogenic E coli (EPEC) NOT DETECTED NOT DETECTED Final   Enterotoxigenic E coli (ETEC) NOT DETECTED NOT DETECTED Final   Shiga  like toxin producing E coli (STEC) NOT DETECTED NOT DETECTED Final   Shigella/Enteroinvasive E coli (EIEC) NOT DETECTED NOT DETECTED Final   Cryptosporidium NOT DETECTED NOT DETECTED Final   Cyclospora cayetanensis NOT DETECTED NOT DETECTED Final   Entamoeba histolytica NOT DETECTED NOT DETECTED Final   Giardia lamblia NOT DETECTED NOT DETECTED Final   Adenovirus F40/41 NOT DETECTED NOT DETECTED Final   Astrovirus NOT DETECTED NOT DETECTED Final   Norovirus GI/GII NOT DETECTED NOT DETECTED Final   Rotavirus A NOT DETECTED NOT DETECTED Final   Sapovirus (I, II, IV, and V) NOT DETECTED NOT DETECTED Final    Comment: Performed at [facility], [address] [place], [place] [address]  Blood Culture (routine x 2)     Status: Abnormal (Preliminary result)   Collection Time: 06/16/19  8:52 PM   Specimen: BLOOD  Result Value Ref Range Status   Specimen Description   Final    BLOOD LEFT FA Performed at [facility], [address], [place], [place] [address]    Special Requests   Final    BOTTLES DRAWN AEROBIC AND ANAEROBIC Blood Culture adequate volume Performed at [facility], [address] [place], [place] [address]    Culture  Setup Time   Final    IN BOTH AEROBIC AND ANAEROBIC BOTTLES GRAM POSITIVE COCCI IN  PAIRS CRITICAL RESULT CALLED TO, READ BACK BY AND VERIFIED WITH: SCOTT HALL ON 06/17/19 AT 0420 [facility] Performed at [facility], [place] [address], [place], [place] [address]    Culture GROUP B STREP(S.AGALACTIAE)ISOLATED (A)  Final   Report Status PENDING  Incomplete  Respiratory Panel by RT PCR (Flu A&B, Covid) - Nasopharyngeal Swab     Status: None   Collection Time: 06/16/19 10:02 PM   Specimen: Nasopharyngeal Swab  Result Value Ref Range Status   SARS Coronavirus 2 by RT PCR NEGATIVE NEGATIVE Final    Comment: (NOTE) SARS-CoV-2 target nucleic acids are NOT DETECTED. The SARS-CoV-2 RNA is generally detectable in upper respiratoy specimens during the acute phase of infection. The lowest concentration of SARS-CoV-2 viral copies this assay can detect is 131 copies/mL. A negative result does not preclude SARS-Cov-2 infection and should not be used as the sole basis for treatment or other patient management decisions. A negative result may occur with  improper specimen collection/handling, submission of specimen other than nasopharyngeal swab, presence of viral mutation(s) within the areas targeted by this assay, and inadequate number of viral copies (<131 copies/mL). A negative result must be combined with clinical observations, patient history, and epidemiological information. The expected result is Negative. Fact Sheet for Patients:  PinkCheek.be Fact Sheet for Healthcare Providers:  GravelBags.it This test is not yet ap proved or cleared by the [place] FDA and  has been authorized for detection and/or diagnosis of SARS-CoV-2 by FDA under an Emergency Use Authorization (EUA). This EUA will remain  in effect (meaning this test can be used) for the duration of the COVID-19 declaration under Section 564(b)(1) of the Act, 21 U.S.C. section 360bbb-3(b)(1), unless the authorization is terminated or revoked sooner.     Influenza A by PCR NEGATIVE NEGATIVE Final   Influenza B by PCR NEGATIVE NEGATIVE Final    Comment: (NOTE) The Xpert Xpress SARS-CoV-2/FLU/RSV assay is intended as an aid in  the diagnosis of influenza from Nasopharyngeal swab specimens and  should not be used as a sole basis for treatment. Nasal washings and  aspirates are unacceptable for Xpert  Xpress SARS-CoV-2/FLU/RSV  testing. Fact Sheet for Patients: PinkCheek.be Fact Sheet for Healthcare Providers: GravelBags.it This test is not yet approved or cleared by the [place] FDA and  has been authorized for detection and/or diagnosis of SARS-CoV-2 by  FDA under an Emergency Use Authorization (EUA). This EUA will remain  in effect (meaning this test can be used) for the duration of the  Covid-19 declaration under Section 564(b)(1) of the Act, 21  U.S.C. section 360bbb-3(b)(1), unless the authorization is  terminated or revoked. Performed at [facility], [address] [place], [place] [address]   Blood Culture (routine x 2)     Status: Abnormal (Preliminary result)   Collection Time: 06/16/19 10:03 PM   Specimen: BLOOD  Result Value Ref Range Status   Specimen Description   Final    BLOOD LEFT FA Performed at [facility], [address] [place], [place] [address]    Special Requests   Final    BOTTLES DRAWN AEROBIC AND ANAEROBIC Blood Culture adequate volume Performed at [facility], [address] [place], [place] [address]    Culture  Setup Time   Final    Organism ID to follow IN BOTH AEROBIC AND ANAEROBIC BOTTLES GRAM POSITIVE COCCI IN PAIRS CRITICAL VALUE NOTED.  VALUE IS CONSISTENT WITH PREVIOUSLY REPORTED AND CALLED VALUE.    Culture (A)  Final    GROUP B STREP(S.AGALACTIAE)ISOLATED SUSCEPTIBILITIES TO FOLLOW Performed at [place] [address], [place], [place] [address]    Report Status PENDING  Incomplete   Blood Culture ID Panel (Reflexed)     Status: Abnormal   Collection Time: 06/16/19 10:03 PM  Result Value Ref Range Status   Enterococcus species NOT DETECTED NOT DETECTED Final   Listeria monocytogenes NOT DETECTED NOT DETECTED Final   Staphylococcus species NOT DETECTED NOT DETECTED Final   Staphylococcus aureus (BCID) NOT DETECTED NOT DETECTED Final   Streptococcus species DETECTED (A) NOT DETECTED Final    Comment: CRITICAL RESULT CALLED TO, READ BACK BY AND VERIFIED WITH: SCOTT HALL ON 06/17/19 AT 0608 [facility]    Streptococcus agalactiae DETECTED (A) NOT DETECTED Final    Comment: CRITICAL RESULT CALLED TO, READ BACK BY AND VERIFIED WITH: SCOTT HALL ON 06/17/19 AT 0608 [facility]    Streptococcus pneumoniae NOT DETECTED NOT DETECTED Final   Streptococcus pyogenes NOT DETECTED NOT DETECTED Final   Acinetobacter baumannii NOT DETECTED NOT DETECTED Final   Enterobacteriaceae species NOT DETECTED NOT DETECTED Final   Enterobacter cloacae complex NOT DETECTED NOT DETECTED Final   Escherichia coli NOT DETECTED NOT DETECTED Final   Klebsiella oxytoca NOT DETECTED NOT DETECTED Final   Klebsiella pneumoniae NOT DETECTED NOT DETECTED Final   Proteus species NOT DETECTED NOT DETECTED Final   Serratia marcescens NOT DETECTED NOT DETECTED Final   Haemophilus influenzae NOT DETECTED NOT DETECTED Final   Neisseria meningitidis NOT DETECTED NOT DETECTED Final   Pseudomonas aeruginosa NOT DETECTED NOT DETECTED Final   Candida albicans NOT DETECTED NOT DETECTED Final   Candida glabrata NOT DETECTED NOT DETECTED Final   Candida krusei NOT DETECTED NOT DETECTED Final   Candida parapsilosis NOT DETECTED NOT DETECTED Final   Candida tropicalis NOT DETECTED NOT DETECTED Final    Comment: Performed at [facility], [address] [place], [place] [address]  Urine culture     Status: None   Collection Time: 06/16/19 11:04 PM   Specimen: In/Out Cath Urine  Result Value Ref Range Status  Specimen  Description   Final    IN/OUT CATH URINE Performed at [facility], [address] [place], [place] [address]    Special Requests   Final    NONE Performed at [facility], [address], [place], [place] [address]    Culture   Final    NO GROWTH Performed at [facility], [place] [address] [place], [place] [address]    Report Status 06/18/2019 FINAL  Final      Radiology Studies: CT ABDOMEN PELVIS WO CONTRAST  Result Date: 06/17/2019 CLINICAL DATA:  Vomiting. Diarrhea. EXAM: CT ABDOMEN AND PELVIS WITHOUT CONTRAST TECHNIQUE: Multidetector CT imaging of the abdomen and pelvis was performed following the standard protocol without IV contrast. COMPARISON:  Aug 14, 2018 FINDINGS: Lower chest: There are moderate-sized bilateral pleural effusions with adjacent atelectasis.There is massive cardiomegaly. Hepatobiliary: The liver surface appears somewhat nodular. Normal gallbladder.There is no biliary ductal dilation. Pancreas: Normal contours without ductal dilatation. No peripancreatic fluid collection. Spleen: The spleen is borderline enlarged. Adrenals/Urinary Tract: --Adrenal glands: No adrenal hemorrhage. --Right kidney/ureter: There is extensive scarring throughout the right kidney without evidence for hydronephrosis or radiopaque obstructing kidney stone. --Left kidney/ureter: There is no left-sided hydronephrosis. --Urinary bladder: Unremarkable. Stomach/Bowel: --Stomach/Duodenum: No hiatal hernia or other gastric abnormality. Normal duodenal course and caliber. --Small bowel: No dilatation or inflammation. --Colon: There is scattered colonic diverticula without CT evidence for diverticulitis. --Appendix: Normal. Vascular/Lymphatic: Extensive atherosclerotic changes are noted. The patient is status post EVAR. --No retroperitoneal lymphadenopathy. --No mesenteric lymphadenopathy. --No pelvic or inguinal lymphadenopathy. Reproductive: Unremarkable Other: No ascites or free  air. The abdominal wall is normal. Musculoskeletal. No acute displaced fractures. IMPRESSION: 1. No acute abnormality detected within the abdomen or pelvis. 2. Cardiomegaly with moderate-sized bilateral pleural effusions. 3. Cirrhosis 4. There are additional chronic findings as detailed above. Aortic Atherosclerosis (ICD10-I70.0). Electronically Signed   By: Constance Holster M.D.   On: 06/17/2019 03:09   DG Chest Port 1 View  Result Date: 06/16/2019 CLINICAL DATA:  Fevers and lethargy EXAM: PORTABLE CHEST 1 VIEW COMPARISON:  05/19/2019 FINDINGS: Cardiac shadow is enlarged in size but stable. Defibrillator is again seen and stable. Postsurgical changes are again noted. Aortic calcifications are seen. Vascular congestion is again identified with mild edema. No focal infiltrate is seen. Stable left basilar atelectasis is noted. No acute bony abnormality is seen. IMPRESSION: Stable changes of vascular congestion.  No acute abnormality noted. Electronically Signed   By: Inez Catalina M.D.   On: 06/16/2019 21:39     Scheduled Meds: . pantoprazole  40 mg Oral Daily   Continuous Infusions: . pencillin G potassium IV 3 Million Units (06/18/19 1318)     LOS: 2 days     Enzo Bi, MD [facility] If 7PM-7AM, please contact night-coverage 06/18/2019, 2:51 PM

## 2019-06-18 NOTE — Plan of Care (Signed)
Continuing with plan of care. 

## 2019-06-19 ENCOUNTER — Inpatient Hospital Stay (HOSPITAL_COMMUNITY)
Admit: 2019-06-19 | Discharge: 2019-06-19 | Disposition: A | Discharge status: Home / Self Care | Payer: PPO | Attending: Hospitalist | Admitting: Hospitalist

## 2019-06-19 DIAGNOSIS — R8271 Bacteriuria: Secondary | ICD-10-CM | POA: Diagnosis not present

## 2019-06-19 DIAGNOSIS — R7881 Bacteremia: Secondary | ICD-10-CM

## 2019-06-19 LAB — ECHOCARDIOGRAM COMPLETE
Height: 74 in
Weight: 3654.34 oz

## 2019-06-19 LAB — CBC
HCT: 28.5 % — ABNORMAL LOW (ref 39.0–52.0)
Hemoglobin: 8.1 g/dL — ABNORMAL LOW (ref 13.0–17.0)
MCH: 23 pg — ABNORMAL LOW (ref 26.0–34.0)
MCHC: 28.4 g/dL — ABNORMAL LOW (ref 30.0–36.0)
MCV: 81 fL (ref 80.0–100.0)
Platelets: 195 10*3/uL (ref 150–400)
RBC: 3.52 MIL/uL — ABNORMAL LOW (ref 4.22–5.81)
RDW: 20.1 % — ABNORMAL HIGH (ref 11.5–15.5)
WBC: 9.9 10*3/uL (ref 4.0–10.5)
nRBC: 0 % (ref 0.0–0.2)

## 2019-06-19 LAB — BASIC METABOLIC PANEL
Anion gap: 4 — ABNORMAL LOW (ref 5–15)
BUN: 39 mg/dL — ABNORMAL HIGH (ref 8–23)
CO2: 29 mmol/L (ref 22–32)
Calcium: 7.8 mg/dL — ABNORMAL LOW (ref 8.9–10.3)
Chloride: 107 mmol/L (ref 98–111)
Creatinine, Ser: 1.33 mg/dL — ABNORMAL HIGH (ref 0.61–1.24)
GFR calc Af Amer: 57 mL/min — ABNORMAL LOW (ref >60)
GFR calc non Af Amer: 49 mL/min — ABNORMAL LOW (ref >60)
Glucose, Bld: 114 mg/dL — ABNORMAL HIGH (ref 70–99)
Potassium: 3.7 mmol/L (ref 3.5–5.1)
Sodium: 140 mmol/L (ref 135–145)

## 2019-06-19 LAB — CULTURE, BLOOD (ROUTINE X 2)
Special Requests: ADEQUATE
Special Requests: ADEQUATE

## 2019-06-19 LAB — MAGNESIUM: Magnesium: 2 mg/dL (ref 1.7–2.4)

## 2019-06-19 MED ORDER — TORSEMIDE 20 MG PO TABS
40.0000 mg | ORAL_TABLET | Freq: Every day | ORAL | Status: DC
  Administered 2019-06-19: 40 mg via ORAL
  Filled 2019-06-19 (×3): qty 2

## 2019-06-19 MED ORDER — SODIUM CHLORIDE 0.9 % IV SOLN
INTRAVENOUS | Status: DC | PRN
  Administered 2019-06-19: 250 mL via INTRAVENOUS
  Administered 2019-06-23: 1000 mL via INTRAVENOUS
  Administered 2019-06-24: 40 mL via INTRAVENOUS

## 2019-06-19 NOTE — Progress Notes (Signed)
*  PRELIMINARY RESULTS* Echocardiogram 2D Echocardiogram has been performed.  Thomas [name] 06/19/2019, 11:55 AM

## 2019-06-19 NOTE — Care Management Important Message (Signed)
Important Message  Patient Details  Name: Thomas [name] MRN: [id number] Date of Birth: 1934-08-08   Medicare Important Message Given:  Yes  Initial Medicare IM given by Patient Access Associate on 06/18/2019 at 8:40am.     Dannette Barbara 06/19/2019, 8:27 AM

## 2019-06-19 NOTE — Progress Notes (Signed)
PROGRESS NOTE    Thomas [name]  [id number] DOB: 28-Nov-1934 DOA: 06/16/2019 PCP: [name], MD    Assessment & Plan:   Principal Problem:   Group B streptococcal bacteriuria Active Problems:   Cardiomyopathy, ischemic   CAD Status post coronary artery bypass grafting: 1995   Permanent atrial fibrillation ([place])   Biventricular ICD in place   CAD S/P multiple PCIs   Bilateral deafness   Lymphedema   Anemia   Hypotension   Biventricular automatic implantable cardioverter defibrillator in situ   CRI (chronic renal insufficiency), stage 3 (moderate)   Anasarca   Leg swelling   SIRS (systemic inflammatory response syndrome) (H[facility])   Bacteremia   AKI (acute kidney injury) ([place])   Lactic acidosis   Pressure ulcer, stage 2 ([place])    Thomas [name] is a 85 y.o. Caucasian male with medical history significant for chronic a-fib, sCHF (ef 35-40%), ICD/pacemaker, htn, deaf, ckd3, anasarca, GIB 2/2 AVMs, CVA, and newly identified possible cirrhosis, presenting with chills, vomiting and diarrhea.   # Sepsis 2/2 Group B Strep bacteremia # lactic acidosis, resolved - WBCs elevated, lactate elevated, with mild aki, and febrile.  No signs cellulitis. CXR clear. covid and flu negative. Urinalysis not suggestive of infection. No report of IV drug use.  CT a/p wo showed no acute finding. --started on vanc/zosyn on presentation. --2 of 4 bottles grew Strep AGALACTIAE PLAN: - continue Penicillin, per pharm rec --f/u 2nd set of blood cx x2 from 4/4 --TTE can not rule out endocarditis, will plan for TEE tomorrow (NPO [place])  # Systolic heart failure - recent ef 35-40%.  Wt (201) is same as at cardiologist's 2 wks ago, d/c wt was 199, and wife reports no change in edema, no pulmonary edema on exam. Thus do not think sig chf exacerbation at this point. Has pacer/icd. PLAN: - Hold carvedilol for now due to hypotension  - resume torsemide 40 mg daily - daily weights, I/Os  # Hypoxia  -  appears to be 2/2 acute illness. o2 monitor also with difficulty tracing. cxr clear, do not think sig fluid overload.   # hypokalemia - mild --replete PRN  # Acute kidney injury, improved - likely prerenal 2/2 sirs. Cr 1.76 from baseline of around 1.3. - treat as above  # Anemia - chronic,  --baseline 7-8 - monitor  # Deaf   # chronic a-fib - recent d/c of anticoagulation - Hold carvedilol for now due to hypotension HR low  # Skin excoriations on buttocks, not pressure ulcers yet.  # bloody diarrhea --1 episode noted morning after presentation --Monitor Hgb   DVT prophylaxis: SCD/Compression stockings Code Status: Full code  Family Communication:  Disposition Plan: Will need to have repeat blood cx neg for 3 days before discharge, so may discharge in 2 days, if TEE neg.   Subjective and Interval History:  Interview conducted via writing.  Pt again had no complaints.  No fever, dyspnea, chest pain, abdominal pain, N/V/D.  Eating ok.   Objective: Vitals:   06/18/19 1810 06/18/19 1949 06/19/19 0437 06/19/19 0555  BP: 134/61 118/71  116/77  Pulse: 60 (!) 44  63  Resp:  18  20  Temp: 98 F (36.7 C) 97.9 F (36.6 C)  97.6 F (36.4 C)  TempSrc: Oral Oral  Oral  SpO2: 97% 95%  93%  Weight:   103.6 kg   Height:        Intake/Output Summary (Last 24 hours) at 06/19/2019 1839  Last data filed at 06/19/2019 1322 Gross per 24 hour  Intake 740 ml  Output 475 ml  Net 265 ml   Filed Weights   06/16/19 2059 06/18/19 0540 06/19/19 0437  Weight: 91.2 kg 103.6 kg 103.6 kg    Examination:   Constitutional: NAD, alert HEENT: conjunctivae and lids normal, EOMI, deaf CV: RRR no M,R,G. Distal pulses +2.  No cyanosis.   RESP: CTA B/L, normal respiratory effort  GI: +BS, NTND Extremities: Lumpy, uneven pitting edema in BLE.  Left arm more swollen. SKIN: warm, dry and intact Neuro: II - XII grossly intact.  Sensation intact Psych: Normal mood and affect.     Data  Reviewed: I have personally reviewed following labs and imaging studies  CBC: Recent Labs  Lab 06/16/19 2052 06/17/19 0321 06/18/19 0444 06/19/19 0534  WBC 17.9* 16.2* 13.6* 9.9  NEUTROABS 16.1*  --   --   --   HGB 8.5* 7.5* 7.6* 8.1*  HCT 27.7* 24.6* 26.1* 28.5*  MCV 78.0* 78.1* 81.6 81.0  PLT 206 172 162 0000000   Basic Metabolic Panel: Recent Labs  Lab 06/16/19 2052 06/17/19 0321 06/18/19 0444 06/19/19 0534  NA 140 139 138 140  K 3.3* 3.3* 3.3* 3.7  CL 102 104 105 107  CO2 26 26 26 29   GLUCOSE 118* 127* 126* 114*  BUN 32* 36* 43* 39*  CREATININE 1.76* 1.57* 1.57* 1.33*  CALCIUM 8.1* 7.4* 7.4* 7.8*  MG  --  1.8 2.0 2.0   GFR: Estimated Creatinine Clearance: 55 mL/min (A) (by C-G formula based on SCr of 1.33 mg/dL (H)). Liver Function Tests: Recent Labs  Lab 06/16/19 2052 06/17/19 0321  AST 51* 44*  ALT 17 15  ALKPHOS 100 80  BILITOT 1.1 1.1  PROT 5.2* 4.4*  ALBUMIN 2.1* 1.7*   Recent Labs  Lab 06/16/19 2052  LIPASE 17   No results for input(s): AMMONIA in the last 168 hours. Coagulation Profile: Recent Labs  Lab 06/16/19 2052  INR 1.4*   Cardiac Enzymes: No results for input(s): CKTOTAL, CKMB, CKMBINDEX, TROPONINI in the last 168 hours. BNP (last 3 results) No results for input(s): PROBNP in the last 8760 hours. HbA1C: No results for input(s): HGBA1C in the last 72 hours. CBG: No results for input(s): GLUCAP in the last 168 hours. Lipid Profile: No results for input(s): CHOL, HDL, LDLCALC, TRIG, CHOLHDL, LDLDIRECT in the last 72 hours. Thyroid Function Tests: No results for input(s): TSH, T4TOTAL, FREET4, T3FREE, THYROIDAB in the last 72 hours. Anemia Panel: No results for input(s): VITAMINB12, FOLATE, FERRITIN, TIBC, IRON, RETICCTPCT in the last 72 hours. Sepsis Labs: Recent Labs  Lab 06/16/19 2052 06/16/19 2354 06/17/19 1754  LATICACIDVEN 4.6* 3.5* 1.0    Recent Results (from the past 240 hour(s))  C Difficile Quick Screen w PCR  reflex     Status: None   Collection Time: 06/16/19  2:06 AM   Specimen: STOOL  Result Value Ref Range Status   C Diff antigen NEGATIVE NEGATIVE Final   C Diff toxin NEGATIVE NEGATIVE Final   C Diff interpretation No C. difficile detected.  Final    Comment: Performed at [facility], [address] [place], [place] [address]  Gastrointestinal Panel by PCR , Stool     Status: None   Collection Time: 06/16/19  2:06 AM  Result Value Ref Range Status   Campylobacter species NOT DETECTED NOT DETECTED Final   Plesimonas shigelloides NOT DETECTED NOT DETECTED Final   Salmonella species  NOT DETECTED NOT DETECTED Final   Yersinia enterocolitica NOT DETECTED NOT DETECTED Final   Vibrio species NOT DETECTED NOT DETECTED Final   Vibrio cholerae NOT DETECTED NOT DETECTED Final   Enteroaggregative E coli (EAEC) NOT DETECTED NOT DETECTED Final   Enteropathogenic E coli (EPEC) NOT DETECTED NOT DETECTED Final   Enterotoxigenic E coli (ETEC) NOT DETECTED NOT DETECTED Final   Shiga like toxin producing E coli (STEC) NOT DETECTED NOT DETECTED Final   Shigella/Enteroinvasive E coli (EIEC) NOT DETECTED NOT DETECTED Final   Cryptosporidium NOT DETECTED NOT DETECTED Final   Cyclospora cayetanensis NOT DETECTED NOT DETECTED Final   Entamoeba histolytica NOT DETECTED NOT DETECTED Final   Giardia lamblia NOT DETECTED NOT DETECTED Final   Adenovirus F40/41 NOT DETECTED NOT DETECTED Final   Astrovirus NOT DETECTED NOT DETECTED Final   Norovirus GI/GII NOT DETECTED NOT DETECTED Final   Rotavirus A NOT DETECTED NOT DETECTED Final   Sapovirus (I, II, IV, and V) NOT DETECTED NOT DETECTED Final    Comment: Performed at [facility], [address] [place], [place] [address]  Blood Culture (routine x 2)     Status: Abnormal   Collection Time: 06/16/19  8:52 PM   Specimen: BLOOD  Result Value Ref Range Status   Specimen Description   Final    BLOOD LEFT FA Performed at [facility],  [address], [place], [place] [address]    Special Requests   Final    BOTTLES DRAWN AEROBIC AND ANAEROBIC Blood Culture adequate volume Performed at [facility], [address] [place], [place] [address]    Culture  Setup Time   Final    IN BOTH AEROBIC AND ANAEROBIC BOTTLES GRAM POSITIVE COCCI IN PAIRS CRITICAL RESULT CALLED TO, READ BACK BY AND VERIFIED WITH: SCOTT HALL ON 06/17/19 AT 0420 [facility]    Culture (A)  Final    GROUP B STREP(S.AGALACTIAE)ISOLATED SUSCEPTIBILITIES PERFORMED ON PREVIOUS CULTURE WITHIN THE LAST 5 DAYS. Performed at [facility], [place] [address], [place],  [address]    Report Status 06/19/2019 FINAL  Final  Respiratory Panel by RT PCR (Flu A&B, Covid) - Nasopharyngeal Swab     Status: None   Collection Time: 06/16/19 10:02 PM   Specimen: Nasopharyngeal Swab  Result Value Ref Range Status   SARS Coronavirus 2 by RT PCR NEGATIVE NEGATIVE Final    Comment: (NOTE) SARS-CoV-2 target nucleic acids are NOT DETECTED. The SARS-CoV-2 RNA is generally detectable in upper respiratoy specimens during the acute phase of infection. The lowest concentration of SARS-CoV-2 viral copies this assay can detect is 131 copies/mL. A negative result does not preclude SARS-Cov-2 infection and should not be used as the sole basis for treatment or other patient management decisions. A negative result may occur with  improper specimen collection/handling, submission of specimen other than nasopharyngeal swab, presence of viral mutation(s) within the areas targeted by this assay, and inadequate number of viral copies (<131 copies/mL). A negative result must be combined with clinical observations, patient history, and epidemiological information. The expected result is Negative. Fact Sheet for Patients:  PinkCheek.be Fact Sheet for Healthcare Providers:  GravelBags.it This test is not yet ap proved or  cleared by the [place] FDA and  has been authorized for detection and/or diagnosis of SARS-CoV-2 by FDA under an Emergency Use Authorization (EUA). This EUA will remain  in effect (meaning this test can be used) for the duration of the COVID-19 declaration under Section  564(b)(1) of the Act, 21 U.S.C. section 360bbb-3(b)(1), unless the authorization is terminated or revoked sooner.    Influenza A by PCR NEGATIVE NEGATIVE Final   Influenza B by PCR NEGATIVE NEGATIVE Final    Comment: (NOTE) The Xpert Xpress SARS-CoV-2/FLU/RSV assay is intended as an aid in  the diagnosis of influenza from Nasopharyngeal swab specimens and  should not be used as a sole basis for treatment. Nasal washings and  aspirates are unacceptable for Xpert Xpress SARS-CoV-2/FLU/RSV  testing. Fact Sheet for Patients: PinkCheek.be Fact Sheet for Healthcare Providers: GravelBags.it This test is not yet approved or cleared by the [place] FDA and  has been authorized for detection and/or diagnosis of SARS-CoV-2 by  FDA under an Emergency Use Authorization (EUA). This EUA will remain  in effect (meaning this test can be used) for the duration of the  Covid-19 declaration under Section 564(b)(1) of the Act, 21  U.S.C. section 360bbb-3(b)(1), unless the authorization is  terminated or revoked. Performed at [facility], [address] [place], [place] [address]   Blood Culture (routine x 2)     Status: Abnormal   Collection Time: 06/16/19 10:03 PM   Specimen: BLOOD  Result Value Ref Range Status   Specimen Description   Final    BLOOD LEFT FA Performed at [facility], [address] [place], [place] [address]    Special Requests   Final    BOTTLES DRAWN AEROBIC AND ANAEROBIC Blood Culture adequate volume Performed at [facility], [address] [place], [place] [address]    Culture  Setup Time   Final     Organism ID to follow IN BOTH AEROBIC AND ANAEROBIC BOTTLES GRAM POSITIVE COCCI IN PAIRS CRITICAL VALUE NOTED.  VALUE IS CONSISTENT WITH PREVIOUSLY REPORTED AND CALLED VALUE. Performed at [facility], [place] [address], [place], [place] [address]    Culture GROUP B STREP(S.AGALACTIAE)ISOLATED (A)  Final   Report Status 06/19/2019 FINAL  Final   Organism ID, Bacteria GROUP B STREP(S.AGALACTIAE)ISOLATED  Final      Susceptibility   Group b strep(s.agalactiae)isolated - MIC*    CLINDAMYCIN >=1 RESISTANT Resistant     AMPICILLIN <=0.25 SENSITIVE Sensitive     ERYTHROMYCIN >=8 RESISTANT Resistant     VANCOMYCIN 0.5 SENSITIVE Sensitive     CEFTRIAXONE <=0.12 SENSITIVE Sensitive     LEVOFLOXACIN 1 SENSITIVE Sensitive     * GROUP B STREP(S.AGALACTIAE)ISOLATED  Blood Culture ID Panel (Reflexed)     Status: Abnormal   Collection Time: 06/16/19 10:03 PM  Result Value Ref Range Status   Enterococcus species NOT DETECTED NOT DETECTED Final   Listeria monocytogenes NOT DETECTED NOT DETECTED Final   Staphylococcus species NOT DETECTED NOT DETECTED Final   Staphylococcus aureus (BCID) NOT DETECTED NOT DETECTED Final   Streptococcus species DETECTED (A) NOT DETECTED Final    Comment: CRITICAL RESULT CALLED TO, READ BACK BY AND VERIFIED WITH: SCOTT HALL ON 06/17/19 AT [id number] [facility]    Streptococcus agalactiae DETECTED (A) NOT DETECTED Final    Comment: CRITICAL RESULT CALLED TO, READ BACK BY AND VERIFIED WITH: SCOTT HALL ON 06/17/19 AT 0608 [facility]    Streptococcus pneumoniae NOT DETECTED NOT DETECTED Final   Streptococcus pyogenes NOT DETECTED NOT DETECTED Final   Acinetobacter baumannii NOT DETECTED NOT DETECTED Final   Enterobacteriaceae species NOT DETECTED NOT DETECTED Final   Enterobacter cloacae complex NOT DETECTED NOT DETECTED Final   Escherichia coli NOT DETECTED NOT DETECTED Final  Klebsiella oxytoca NOT DETECTED NOT DETECTED Final   Klebsiella pneumoniae NOT DETECTED NOT DETECTED Final    Proteus species NOT DETECTED NOT DETECTED Final   Serratia marcescens NOT DETECTED NOT DETECTED Final   Haemophilus influenzae NOT DETECTED NOT DETECTED Final   Neisseria meningitidis NOT DETECTED NOT DETECTED Final   Pseudomonas aeruginosa NOT DETECTED NOT DETECTED Final   Candida albicans NOT DETECTED NOT DETECTED Final   Candida glabrata NOT DETECTED NOT DETECTED Final   Candida krusei NOT DETECTED NOT DETECTED Final   Candida parapsilosis NOT DETECTED NOT DETECTED Final   Candida tropicalis NOT DETECTED NOT DETECTED Final    Comment: Performed at [facility], [address] [place], [place] [address]  Urine culture     Status: None   Collection Time: 06/16/19 11:04 PM   Specimen: In/Out Cath Urine  Result Value Ref Range Status   Specimen Description   Final    IN/OUT CATH URINE Performed at [facility], [address] [place], [place] [address]    Special Requests   Final    NONE Performed at [facility], [address], [place], [place] [address]    Culture   Final    NO GROWTH Performed at [facility], [place] [address] [place], [place] [address]    Report Status 06/18/2019 FINAL  Final  Culture, blood (Routine X 2) w Reflex to ID Panel     Status: None (Preliminary result)   Collection Time: 06/18/19  5:07 PM   Specimen: BLOOD  Result Value Ref Range Status   Specimen Description BLOOD BLOOD LEFT HAND  Final   Special Requests   Final    BOTTLES DRAWN AEROBIC AND ANAEROBIC Blood Culture adequate volume   Culture   Final    NO GROWTH < 12 HOURS Performed at [facility], [address], [place], [place] [address]    Report Status PENDING  Incomplete  Culture, blood (Routine X 2) w Reflex to ID Panel     Status: None (Preliminary result)   Collection Time: 06/18/19  5:07 PM   Specimen: BLOOD  Result Value Ref Range Status   Specimen Description BLOOD BLOOD RIGHT HAND  Final   Special Requests AEROBIC BOTTLE ONLY  Blood Culture adequate volume  Final   Culture   Final    NO GROWTH < 12 HOURS Performed at [facility], [address], [place], [place] [address]    Report Status PENDING  Incomplete      Radiology Studies: ECHOCARDIOGRAM COMPLETE  Result Date: 06/19/2019    ECHOCARDIOGRAM REPORT   Patient Name:   JASH JASON Date of Exam: 06/19/2019 Medical Rec #:  [id number]      Height:       74.0 in Accession #:    [id number]     Weight:       228.4 lb Date of Birth:  September 15, 1934     BSA:          2.300 m Patient Age:    53 years       BP:           116/77 mmHg Patient Gender: M              HR:           62 bpm. Exam Location:  [facility] Procedure: 2D Echo, Color Doppler and Cardiac Doppler Indications:     R78.81 Bacteremia  History:  Patient has prior history of Echocardiogram examinations, most                  recent 05/21/2019. Ischemic Cardiomyopathy and CHF, CAD, Prior                  CABG and Pacemaker, CKD; Risk Factors:Hypertension.  Sonographer:     Charmayne Sheer RDCS (AE) Referring Phys:  [id number] Otila Kluver Jackquelyn Sundberg Diagnosing Phys: Kathlyn Sacramento MD IMPRESSIONS  1. Left ventricular ejection fraction, by estimation, is 35 to 40%. The left ventricle has moderately decreased function. The left ventricle has no regional wall motion abnormalities. The left ventricular internal cavity size was mildly to moderately dilated. Left ventricular diastolic parameters are indeterminate.  2. Right ventricular systolic function is mildly reduced. The right ventricular size is normal. There is moderately elevated pulmonary artery systolic pressure. The estimated right ventricular systolic pressure is 123456 mmHg.  3. Left atrial size was severely dilated.  4. Right atrial size was moderately dilated.  5. Moderate pleural effusion in the left lateral region.  6. The mitral valve is normal in structure. Mild mitral valve regurgitation. No evidence of mitral stenosis.  7. Tricuspid valve regurgitation is moderate.  8. The  aortic valve is normal in structure. Aortic valve regurgitation is not visualized. Mild to moderate aortic valve sclerosis/calcification is present, without any evidence of aortic stenosis.  9. Moderately dilated pulmonary artery. 10. The inferior vena cava is dilated in size with <50% respiratory variability, suggesting right atrial pressure of 15 mmHg. 11. No clear vegetation but the RV pacemaker lead appears thinkened. A vegetation can not be excluded. Consider TEE. FINDINGS  Left Ventricle: Left ventricular ejection fraction, by estimation, is 35 to 40%. The left ventricle has moderately decreased function. The left ventricle has no regional wall motion abnormalities. The left ventricular internal cavity size was mildly to moderately dilated. There is no left ventricular hypertrophy. Left ventricular diastolic parameters are indeterminate. Right Ventricle: The right ventricular size is normal. No increase in right ventricular wall thickness. Right ventricular systolic function is mildly reduced. There is moderately elevated pulmonary artery systolic pressure. The tricuspid regurgitant velocity is 3.35 m/s, and with an assumed right atrial pressure of 15 mmHg, the estimated right ventricular systolic pressure is 123456 mmHg. Left Atrium: Left atrial size was severely dilated. Right Atrium: Right atrial size was moderately dilated. Pericardium: There is no evidence of pericardial effusion. Mitral Valve: The mitral valve is normal in structure. Normal mobility of the mitral valve leaflets. Mild mitral valve regurgitation. No evidence of mitral valve stenosis. MV peak gradient, 9.9 mmHg. The mean mitral valve gradient is 3.0 mmHg. Tricuspid Valve: The tricuspid valve is normal in structure. Tricuspid valve regurgitation is moderate . No evidence of tricuspid stenosis. Aortic Valve: The aortic valve is normal in structure. Aortic valve regurgitation is not visualized. Mild to moderate aortic valve  sclerosis/calcification is present, without any evidence of aortic stenosis. Aortic valve mean gradient measures 7.0 mmHg. Aortic valve peak gradient measures 13.2 mmHg. Aortic valve area, by VTI measures 2.21 cm. Pulmonic Valve: The pulmonic valve was normal in structure. Pulmonic valve regurgitation is mild. No evidence of pulmonic stenosis. Aorta: The aortic root is normal in size and structure. Pulmonary Artery: The pulmonary artery is moderately dilated. Venous: The inferior vena cava is dilated in size with less than 50% respiratory variability, suggesting right atrial pressure of 15 mmHg. IAS/Shunts: No atrial level shunt detected by color flow Doppler. Additional Comments: A pacer wire is  visualized. There is a moderate pleural effusion in the left lateral region.  LEFT VENTRICLE PLAX 2D LVIDd:         5.83 cm      Diastology LVIDs:         4.99 cm      LV e' lateral:   9.68 cm/s LV PW:         0.88 cm      LV E/e' lateral: 15.0 LV IVS:        0.84 cm      LV e' medial:    6.09 cm/s LVOT diam:     2.10 cm      LV E/e' medial:  23.9 LV SV:         71 LV SV Index:   31 LVOT Area:     3.46 cm  LV Volumes (MOD) LV vol d, MOD A4C: 221.0 ml LV vol s, MOD A4C: 101.0 ml LV SV MOD A4C:     221.0 ml RIGHT VENTRICLE RV Basal diam:  5.16 cm LEFT ATRIUM            Index       RIGHT ATRIUM           Index LA diam:      5.90 cm  2.57 cm/m  RA Area:     30.90 cm LA Vol (A2C): 85.6 ml  37.22 ml/m RA Volume:   112.00 ml 48.70 ml/m LA Vol (A4C): 148.0 ml 64.36 ml/m  AORTIC VALVE                    PULMONIC VALVE AV Area (Vmax):    2.15 cm     PV Vmax:       1.05 m/s AV Area (Vmean):   1.99 cm     PV Vmean:      69.000 cm/s AV Area (VTI):     2.21 cm     PV VTI:        0.216 m AV Vmax:           182.00 cm/s  PV Peak grad:  4.4 mmHg AV Vmean:          119.000 cm/s PV Mean grad:  2.0 mmHg AV VTI:            0.319 m AV Peak Grad:      13.2 mmHg AV Mean Grad:      7.0 mmHg LVOT Vmax:         113.00 cm/s LVOT Vmean:         68.400 cm/s LVOT VTI:          0.204 m LVOT/AV VTI ratio: 0.64  AORTA Ao Root diam: 4.10 cm MITRAL VALVE                TRICUSPID VALVE MV Area (PHT): 3.76 cm     TR Peak grad:   44.9 mmHg MV Peak grad:  9.9 mmHg     TR Vmax:        335.00 cm/s MV Mean grad:  3.0 mmHg MV Vmax:       1.57 m/s     SHUNTS MV Vmean:      72.1 cm/s    Systemic VTI:  0.20 m MV Decel Time: 202 msec     Systemic Diam: 2.10 cm MV E velocity: 145.67 cm/s Kathlyn Sacramento MD Electronically signed by Kathlyn Sacramento MD Signature Date/Time: 06/19/2019/1:07:51 PM    Final  Scheduled Meds: . pantoprazole  40 mg Oral Daily  . torsemide  40 mg Oral Daily   Continuous Infusions: . sodium chloride 250 mL (06/19/19 0957)  . pencillin G potassium IV 3 Million Units (06/19/19 1740)     LOS: 3 days     Enzo Bi, MD [facility] If 7PM-7AM, please contact night-coverage 06/19/2019, 6:39 PM

## 2019-06-20 DIAGNOSIS — R8271 Bacteriuria: Secondary | ICD-10-CM

## 2019-06-20 DIAGNOSIS — I4821 Permanent atrial fibrillation: Secondary | ICD-10-CM

## 2019-06-20 DIAGNOSIS — I5043 Acute on chronic combined systolic (congestive) and diastolic (congestive) heart failure: Secondary | ICD-10-CM

## 2019-06-20 LAB — BASIC METABOLIC PANEL
Anion gap: 8 (ref 5–15)
BUN: 31 mg/dL — ABNORMAL HIGH (ref 8–23)
CO2: 28 mmol/L (ref 22–32)
Calcium: 7.7 mg/dL — ABNORMAL LOW (ref 8.9–10.3)
Chloride: 106 mmol/L (ref 98–111)
Creatinine, Ser: 1.14 mg/dL (ref 0.61–1.24)
GFR calc Af Amer: >60 mL/min (ref >60)
GFR calc non Af Amer: 60 mL/min — ABNORMAL LOW (ref >60)
Glucose, Bld: 98 mg/dL (ref 70–99)
Potassium: 3.3 mmol/L — ABNORMAL LOW (ref 3.5–5.1)
Sodium: 142 mmol/L (ref 135–145)

## 2019-06-20 LAB — CBC
HCT: 27.8 % — ABNORMAL LOW (ref 39.0–52.0)
Hemoglobin: 8 g/dL — ABNORMAL LOW (ref 13.0–17.0)
MCH: 23.1 pg — ABNORMAL LOW (ref 26.0–34.0)
MCHC: 28.8 g/dL — ABNORMAL LOW (ref 30.0–36.0)
MCV: 80.3 fL (ref 80.0–100.0)
Platelets: 203 10*3/uL (ref 150–400)
RBC: 3.46 MIL/uL — ABNORMAL LOW (ref 4.22–5.81)
RDW: 19.9 % — ABNORMAL HIGH (ref 11.5–15.5)
WBC: 8.5 10*3/uL (ref 4.0–10.5)
nRBC: 0 % (ref 0.0–0.2)

## 2019-06-20 LAB — MAGNESIUM: Magnesium: 1.9 mg/dL (ref 1.7–2.4)

## 2019-06-20 MED ORDER — POTASSIUM CHLORIDE CRYS ER 20 MEQ PO TBCR
40.0000 meq | EXTENDED_RELEASE_TABLET | Freq: Once | ORAL | Status: AC
  Administered 2019-06-20: 40 meq via ORAL
  Filled 2019-06-20: qty 2

## 2019-06-20 NOTE — Progress Notes (Signed)
PROGRESS NOTE    Thomas [name]  [id number] DOB: [birth year] DOA: 06/16/2019 PCP: [name], MD    Assessment & Plan:   Principal Problem:   Group B streptococcal bacteriuria Active Problems:   Cardiomyopathy, ischemic   CAD Status post coronary artery bypass grafting: 1995   Permanent atrial fibrillation ([place])   Biventricular ICD in place   CAD S/P multiple PCIs   Bilateral deafness   Lymphedema   Anemia   Hypotension   Biventricular automatic implantable cardioverter defibrillator in situ   CRI (chronic renal insufficiency), stage 3 (moderate)   Anasarca   Leg swelling   SIRS (systemic inflammatory response syndrome) (HCC)   Bacteremia   AKI (acute kidney injury) ([place])   Lactic acidosis   Pressure ulcer, stage 2 ([place])    Thomas [name] is a 85 y.o. Caucasian male with medical history significant for chronic a-fib, sCHF (ef 35-40%), ICD/pacemaker, htn, deaf, ckd3, anasarca, GIB 2/2 AVMs, CVA, and newly identified possible cirrhosis, presenting with chills, vomiting and diarrhea.   # Sepsis 2/2 Group B Strep bacteremia # lactic acidosis, resolved - WBCs elevated, lactate elevated, with mild aki, and febrile.  No signs cellulitis. CXR clear. covid and flu negative. Urinalysis not suggestive of infection. No report of IV drug use.  CT a/p wo showed no acute finding. --started on vanc/zosyn on presentation. --2 of 4 bottles grew Strep AGALACTIAE --TTE showed thickened pacemaker lead, can not rule out endocarditis PLAN: - continue Penicillin, per pharm rec --f/u 2nd set of blood cx x2 from 4/4 --consult cardiology for TEE today 4/6 --GI consult requested by cardiology, "given patient's report of black stools over the last month, chronic anemia with history of GI bleeds, recent CT noting changes consistent with cirrhosis, and dysphagia, GI consultation should be obtained to comment on the safety of performing transesophageal echocardiogram." --Will keep pt NPO MN in  case GI plans for EGD tomorrow  # Acute on chronic systolic and diastolic heart failure - recent ef 35-40% # Mod pulm HTN --Wt (201) is same as at cardiologist's 2 wks ago, d/c wt was 199, and wife reported no change in edema. --Per cards, evidence of volume overload, including 2+ BLE edema and moderate pulmonary hypertension by echo as well as severely elevated CVP.   PLAN: - Hold carvedilol for now due to hypotension  - continue torsemide 40 mg daily - daily weights, I/Os  # Hypoxia  - appears to be 2/2 acute illness. o2 monitor also with difficulty tracing. cxr clear, do not think sig fluid overload.   # hypokalemia - mild --replete PRN  # Acute kidney injury, improved - likely prerenal 2/2 sirs. Cr 1.76 from baseline of around 1.3. - treat as above  # Anemia - chronic # bloody diarrhea --1 episode noted morning after presentation --Monitor Hgb --baseline 7-8  # Deaf   # chronic a-fib  - recent d/c of anticoagulation given history of recurrent GI bleeds and chronic anemia. - Hold carvedilol for now due to hypotension HR low Rate controlled with ventricular pacing.   # Skin excoriations on buttocks, not pressure ulcers yet.  Coronary artery disease No angina reported.  Continue secondary prevention with simvastatin.  No antiplatelet therapy in the setting of recurrent GI bleeds, chronic anemia, and report of recent melena.   DVT prophylaxis: SCD/Compression stockings Code Status: Full code  Family Communication:  Disposition Plan: Will need TEE to rule out vegetation, will need GI clearance for TEE, PT eval to determine  disposition.  At least several more days.   Subjective and Interval History:  Interview conducted via writing.  Pt gave thumbs up when asking how he was doing.  No fever, dyspnea, pain, N/V/D.  Eating ok.     Objective: Vitals:   06/19/19 1845 06/19/19 1917 06/20/19 0340 06/20/19 1135  BP: 128/71 103/60 113/63 104/65  Pulse: 65 60 (!)  59 61  Resp:  18 20   Temp: 98.2 F (36.8 C) 98.2 F (36.8 C) 98 F (36.7 C) 97.6 F (36.4 C)  TempSrc: Oral Oral Oral Oral  SpO2: 96% 98% 97% 96%  Weight:      Height:        Intake/Output Summary (Last 24 hours) at 06/20/2019 1726 Last data filed at 06/20/2019 1002 Gross per 24 hour  Intake 450.35 ml  Output 1550 ml  Net -1099.65 ml   Filed Weights   06/16/19 2059 06/18/19 0540 06/19/19 0437  Weight: 91.2 kg 103.6 kg 103.6 kg    Examination:   Constitutional: NAD, alert HEENT: conjunctivae and lids normal, EOMI, deaf CV: RRR no M,R,G. Distal pulses +2.  No cyanosis.   RESP: CTA B/L, normal respiratory effort  GI: +BS, NTND Extremities: Lumpy, uneven pitting edema in BLE.  Left arm more swollen. SKIN: warm, dry and intact Neuro: II - XII grossly intact.  Sensation intact Psych: Normal mood and affect.     Data Reviewed: I have personally reviewed following labs and imaging studies  CBC: Recent Labs  Lab 06/16/19 2052 06/17/19 0321 06/18/19 0444 06/19/19 0534 06/20/19 0544  WBC 17.9* 16.2* 13.6* 9.9 8.5  NEUTROABS 16.1*  --   --   --   --   HGB 8.5* 7.5* 7.6* 8.1* 8.0*  HCT 27.7* 24.6* 26.1* 28.5* 27.8*  MCV 78.0* 78.1* 81.6 81.0 80.3  PLT 206 172 162 195 [id number]   Basic Metabolic Panel: Recent Labs  Lab 06/16/19 2052 06/17/19 0321 06/18/19 0444 06/19/19 0534 06/20/19 0544  NA 140 139 138 140 142  K 3.3* 3.3* 3.3* 3.7 3.3*  CL 102 104 105 107 106  CO2 26 26 26 29 28   GLUCOSE 118* 127* 126* 114* 98  BUN 32* 36* 43* 39* 31*  CREATININE 1.76* 1.57* 1.57* 1.33* 1.14  CALCIUM 8.1* 7.4* 7.4* 7.8* 7.7*  MG  --  1.8 2.0 2.0 1.9   GFR: Estimated Creatinine Clearance: 64.2 mL/min (by C-G formula based on SCr of 1.14 mg/dL). Liver Function Tests: Recent Labs  Lab 06/16/19 2052 06/17/19 0321  AST 51* 44*  ALT 17 15  ALKPHOS 100 80  BILITOT 1.1 1.1  PROT 5.2* 4.4*  ALBUMIN 2.1* 1.7*   Recent Labs  Lab 06/16/19 2052  LIPASE 17   No results for  input(s): AMMONIA in the last 168 hours. Coagulation Profile: Recent Labs  Lab 06/16/19 2052  INR 1.4*   Cardiac Enzymes: No results for input(s): CKTOTAL, CKMB, CKMBINDEX, TROPONINI in the last 168 hours. BNP (last 3 results) No results for input(s): PROBNP in the last 8760 hours. HbA1C: No results for input(s): HGBA1C in the last 72 hours. CBG: No results for input(s): GLUCAP in the last 168 hours. Lipid Profile: No results for input(s): CHOL, HDL, LDLCALC, TRIG, CHOLHDL, LDLDIRECT in the last 72 hours. Thyroid Function Tests: No results for input(s): TSH, T4TOTAL, FREET4, T3FREE, THYROIDAB in the last 72 hours. Anemia Panel: No results for input(s): VITAMINB12, FOLATE, FERRITIN, TIBC, IRON, RETICCTPCT in the last 72 hours. Sepsis Labs: Recent Labs  Lab  06/16/19 2052 06/16/19 2354 06/17/19 1754  LATICACIDVEN 4.6* 3.5* 1.0    Recent Results (from the past 240 hour(s))  C Difficile Quick Screen w PCR reflex     Status: None   Collection Time: 06/16/19  2:06 AM   Specimen: STOOL  Result Value Ref Range Status   C Diff antigen NEGATIVE NEGATIVE Final   C Diff toxin NEGATIVE NEGATIVE Final   C Diff interpretation No C. difficile detected.  Final    Comment: Performed at [facility], [address] [place], [place] [address]  Gastrointestinal Panel by PCR , Stool     Status: None   Collection Time: 06/16/19  2:06 AM  Result Value Ref Range Status   Campylobacter species NOT DETECTED NOT DETECTED Final   Plesimonas shigelloides NOT DETECTED NOT DETECTED Final   Salmonella species NOT DETECTED NOT DETECTED Final   Yersinia enterocolitica NOT DETECTED NOT DETECTED Final   Vibrio species NOT DETECTED NOT DETECTED Final   Vibrio cholerae NOT DETECTED NOT DETECTED Final   Enteroaggregative E coli (EAEC) NOT DETECTED NOT DETECTED Final   Enteropathogenic E coli (EPEC) NOT DETECTED NOT DETECTED Final   Enterotoxigenic E coli (ETEC) NOT DETECTED NOT DETECTED Final    Shiga like toxin producing E coli (STEC) NOT DETECTED NOT DETECTED Final   Shigella/Enteroinvasive E coli (EIEC) NOT DETECTED NOT DETECTED Final   Cryptosporidium NOT DETECTED NOT DETECTED Final   Cyclospora cayetanensis NOT DETECTED NOT DETECTED Final   Entamoeba histolytica NOT DETECTED NOT DETECTED Final   Giardia lamblia NOT DETECTED NOT DETECTED Final   Adenovirus F40/41 NOT DETECTED NOT DETECTED Final   Astrovirus NOT DETECTED NOT DETECTED Final   Norovirus GI/GII NOT DETECTED NOT DETECTED Final   Rotavirus A NOT DETECTED NOT DETECTED Final   Sapovirus (I, II, IV, and V) NOT DETECTED NOT DETECTED Final    Comment: Performed at [facility], [address] [place], [place] [address]  Blood Culture (routine x 2)     Status: Abnormal   Collection Time: 06/16/19  8:52 PM   Specimen: BLOOD  Result Value Ref Range Status   Specimen Description   Final    BLOOD LEFT FA Performed at [facility], [address], [place], [place] [address]    Special Requests   Final    BOTTLES DRAWN AEROBIC AND ANAEROBIC Blood Culture adequate volume Performed at [facility], [address] [place], [place] [address]    Culture  Setup Time   Final    IN BOTH AEROBIC AND ANAEROBIC BOTTLES GRAM POSITIVE COCCI IN PAIRS CRITICAL RESULT CALLED TO, READ BACK BY AND VERIFIED WITH: SCOTT HALL ON 06/17/19 AT 0420 [facility]    Culture (A)  Final    GROUP B STREP(S.AGALACTIAE)ISOLATED SUSCEPTIBILITIES PERFORMED ON PREVIOUS CULTURE WITHIN THE LAST 5 DAYS. Performed at [facility], [place] [address] [place], [place] [address]    Report Status 06/19/2019 FINAL  Final  Respiratory Panel by RT PCR (Flu A&B, Covid) - Nasopharyngeal Swab     Status: None   Collection Time: 06/16/19 10:02 PM   Specimen: Nasopharyngeal Swab  Result Value Ref Range Status   SARS Coronavirus 2 by RT PCR NEGATIVE NEGATIVE Final    Comment: (NOTE) SARS-CoV-2 target nucleic acids are NOT DETECTED. The  SARS-CoV-2 RNA is generally detectable in upper respiratoy specimens during the acute phase of infection. The lowest concentration of SARS-CoV-2 viral copies this assay can detect is 131 copies/mL. A negative  result does not preclude SARS-Cov-2 infection and should not be used as the sole basis for treatment or other patient management decisions. A negative result may occur with  improper specimen collection/handling, submission of specimen other than nasopharyngeal swab, presence of viral mutation(s) within the areas targeted by this assay, and inadequate number of viral copies (<131 copies/mL). A negative result must be combined with clinical observations, patient history, and epidemiological information. The expected result is Negative. Fact Sheet for Patients:  PinkCheek.be Fact Sheet for Healthcare Providers:  GravelBags.it This test is not yet ap proved or cleared by the [place] FDA and  has been authorized for detection and/or diagnosis of SARS-CoV-2 by FDA under an Emergency Use Authorization (EUA). This EUA will remain  in effect (meaning this test can be used) for the duration of the COVID-19 declaration under Section 564(b)(1) of the Act, 21 U.S.C. section 360bbb-3(b)(1), unless the authorization is terminated or revoked sooner.    Influenza A by PCR NEGATIVE NEGATIVE Final   Influenza B by PCR NEGATIVE NEGATIVE Final    Comment: (NOTE) The Xpert Xpress SARS-CoV-2/FLU/RSV assay is intended as an aid in  the diagnosis of influenza from Nasopharyngeal swab specimens and  should not be used as a sole basis for treatment. Nasal washings and  aspirates are unacceptable for Xpert Xpress SARS-CoV-2/FLU/RSV  testing. Fact Sheet for Patients: PinkCheek.be Fact Sheet for Healthcare Providers: GravelBags.it This test is not yet approved or cleared by the [place] FDA and  has been authorized for detection and/or diagnosis of SARS-CoV-2 by  FDA under an Emergency Use Authorization (EUA). This EUA will remain  in effect (meaning this test can be used) for the duration of the  Covid-19 declaration under Section 564(b)(1) of the Act, 21  U.S.C. section 360bbb-3(b)(1), unless the authorization is  terminated or revoked. Performed at [facility], [address] [place], [place] [address]   Blood Culture (routine x 2)     Status: Abnormal   Collection Time: 06/16/19 10:03 PM   Specimen: BLOOD  Result Value Ref Range Status   Specimen Description   Final    BLOOD LEFT FA Performed at [facility], [address] [place], [place] [address]    Special Requests   Final    BOTTLES DRAWN AEROBIC AND ANAEROBIC Blood Culture adequate volume Performed at [facility], [address] [place], [place] [address]    Culture  Setup Time   Final    Organism ID to follow IN BOTH AEROBIC AND ANAEROBIC BOTTLES GRAM POSITIVE COCCI IN PAIRS CRITICAL VALUE NOTED.  VALUE IS CONSISTENT WITH PREVIOUSLY REPORTED AND CALLED VALUE. Performed at [facility], [place] [address], [place], [place] [address]    Culture GROUP B STREP(S.AGALACTIAE)ISOLATED (A)  Final   Report Status 06/19/2019 FINAL  Final   Organism ID, Bacteria GROUP B STREP(S.AGALACTIAE)ISOLATED  Final      Susceptibility   Group b strep(s.agalactiae)isolated - MIC*    CLINDAMYCIN >=1 RESISTANT Resistant     AMPICILLIN <=0.25 SENSITIVE Sensitive     ERYTHROMYCIN >=8 RESISTANT Resistant     VANCOMYCIN 0.5 SENSITIVE Sensitive     CEFTRIAXONE <=0.12 SENSITIVE Sensitive     LEVOFLOXACIN 1 SENSITIVE Sensitive     * GROUP B STREP(S.AGALACTIAE)ISOLATED  Blood Culture ID Panel (Reflexed)     Status: Abnormal   Collection Time: 06/16/19 10:03 PM  Result Value Ref Range Status   Enterococcus species NOT DETECTED NOT DETECTED Final  Listeria monocytogenes NOT DETECTED  NOT DETECTED Final   Staphylococcus species NOT DETECTED NOT DETECTED Final   Staphylococcus aureus (BCID) NOT DETECTED NOT DETECTED Final   Streptococcus species DETECTED (A) NOT DETECTED Final    Comment: CRITICAL RESULT CALLED TO, READ BACK BY AND VERIFIED WITH: SCOTT HALL ON 06/17/19 AT 0608 [facility]    Streptococcus agalactiae DETECTED (A) NOT DETECTED Final    Comment: CRITICAL RESULT CALLED TO, READ BACK BY AND VERIFIED WITH: SCOTT HALL ON 06/17/19 AT 0608 [facility]    Streptococcus pneumoniae NOT DETECTED NOT DETECTED Final   Streptococcus pyogenes NOT DETECTED NOT DETECTED Final   Acinetobacter baumannii NOT DETECTED NOT DETECTED Final   Enterobacteriaceae species NOT DETECTED NOT DETECTED Final   Enterobacter cloacae complex NOT DETECTED NOT DETECTED Final   Escherichia coli NOT DETECTED NOT DETECTED Final   Klebsiella oxytoca NOT DETECTED NOT DETECTED Final   Klebsiella pneumoniae NOT DETECTED NOT DETECTED Final   Proteus species NOT DETECTED NOT DETECTED Final   Serratia marcescens NOT DETECTED NOT DETECTED Final   Haemophilus influenzae NOT DETECTED NOT DETECTED Final   Neisseria meningitidis NOT DETECTED NOT DETECTED Final   Pseudomonas aeruginosa NOT DETECTED NOT DETECTED Final   Candida albicans NOT DETECTED NOT DETECTED Final   Candida glabrata NOT DETECTED NOT DETECTED Final   Candida krusei NOT DETECTED NOT DETECTED Final   Candida parapsilosis NOT DETECTED NOT DETECTED Final   Candida tropicalis NOT DETECTED NOT DETECTED Final    Comment: Performed at [facility], [address], [place], [place] [address]  Urine culture     Status: None   Collection Time: 06/16/19 11:04 PM   Specimen: In/Out Cath Urine  Result Value Ref Range Status   Specimen Description   Final    IN/OUT CATH URINE Performed at [facility], [address], [place], [place] [address]    Special Requests   Final    NONE Performed at [facility], [address]  [place], [place] [address]    Culture   Final    NO GROWTH Performed at [facility], [place] [address] [place], [place] [address]    Report Status 06/18/2019 FINAL  Final  Culture, blood (Routine X 2) w Reflex to ID Panel     Status: None (Preliminary result)   Collection Time: 06/18/19  5:07 PM   Specimen: BLOOD  Result Value Ref Range Status   Specimen Description BLOOD BLOOD LEFT HAND  Final   Special Requests   Final    BOTTLES DRAWN AEROBIC AND ANAEROBIC Blood Culture adequate volume   Culture   Final    NO GROWTH 2 DAYS Performed at [facility], [address], [place], [place] [address]    Report Status PENDING  Incomplete  Culture, blood (Routine X 2) w Reflex to ID Panel     Status: None (Preliminary result)   Collection Time: 06/18/19  5:07 PM   Specimen: BLOOD  Result Value Ref Range Status   Specimen Description BLOOD BLOOD RIGHT HAND  Final   Special Requests AEROBIC BOTTLE ONLY Blood Culture adequate volume  Final   Culture   Final    NO GROWTH 2 DAYS Performed at [facility], [address], [place], [place] [address]    Report Status PENDING  Incomplete      Radiology Studies: ECHOCARDIOGRAM COMPLETE  Result Date: 06/19/2019    ECHOCARDIOGRAM REPORT   Patient Name:   Thomas [name] Date  of Exam: 06/19/2019 Medical Rec #:  [id number]      Height:       74.0 in Accession #:    [id number]     Weight:       228.4 lb Date of Birth:  [birth year]     BSA:          2.300 m Patient Age:    85 years       BP:           116/77 mmHg Patient Gender: M              HR:           62 bpm. Exam Location:  [facility] Procedure: 2D Echo, Color Doppler and Cardiac Doppler Indications:     R78.81 Bacteremia  History:         Patient has prior history of Echocardiogram examinations, most                  recent 05/21/2019. Ischemic Cardiomyopathy and CHF, CAD, Prior                  CABG and Pacemaker, CKD; Risk Factors:Hypertension.  Sonographer:     Charmayne Sheer RDCS (AE)  Referring Phys:  [id number] Otila Kluver Lazaro Isenhower Diagnosing Phys: Kathlyn Sacramento MD IMPRESSIONS  1. Left ventricular ejection fraction, by estimation, is 35 to 40%. The left ventricle has moderately decreased function. The left ventricle has no regional wall motion abnormalities. The left ventricular internal cavity size was mildly to moderately dilated. Left ventricular diastolic parameters are indeterminate.  2. Right ventricular systolic function is mildly reduced. The right ventricular size is normal. There is moderately elevated pulmonary artery systolic pressure. The estimated right ventricular systolic pressure is 123456 mmHg.  3. Left atrial size was severely dilated.  4. Right atrial size was moderately dilated.  5. Moderate pleural effusion in the left lateral region.  6. The mitral valve is normal in structure. Mild mitral valve regurgitation. No evidence of mitral stenosis.  7. Tricuspid valve regurgitation is moderate.  8. The aortic valve is normal in structure. Aortic valve regurgitation is not visualized. Mild to moderate aortic valve sclerosis/calcification is present, without any evidence of aortic stenosis.  9. Moderately dilated pulmonary artery. 10. The inferior vena cava is dilated in size with <50% respiratory variability, suggesting right atrial pressure of 15 mmHg. 11. No clear vegetation but the RV pacemaker lead appears thinkened. A vegetation can not be excluded. Consider TEE. FINDINGS  Left Ventricle: Left ventricular ejection fraction, by estimation, is 35 to 40%. The left ventricle has moderately decreased function. The left ventricle has no regional wall motion abnormalities. The left ventricular internal cavity size was mildly to moderately dilated. There is no left ventricular hypertrophy. Left ventricular diastolic parameters are indeterminate. Right Ventricle: The right ventricular size is normal. No increase in right ventricular wall thickness. Right ventricular systolic function is mildly  reduced. There is moderately elevated pulmonary artery systolic pressure. The tricuspid regurgitant velocity is 3.35 m/s, and with an assumed right atrial pressure of 15 mmHg, the estimated right ventricular systolic pressure is 123456 mmHg. Left Atrium: Left atrial size was severely dilated. Right Atrium: Right atrial size was moderately dilated. Pericardium: There is no evidence of pericardial effusion. Mitral Valve: The mitral valve is normal in structure. Normal mobility of the mitral valve leaflets. Mild mitral valve regurgitation. No evidence of mitral valve stenosis. MV peak gradient, 9.9 mmHg. The mean mitral valve gradient is 3.0 mmHg.  Tricuspid Valve: The tricuspid valve is normal in structure. Tricuspid valve regurgitation is moderate . No evidence of tricuspid stenosis. Aortic Valve: The aortic valve is normal in structure. Aortic valve regurgitation is not visualized. Mild to moderate aortic valve sclerosis/calcification is present, without any evidence of aortic stenosis. Aortic valve mean gradient measures 7.0 mmHg. Aortic valve peak gradient measures 13.2 mmHg. Aortic valve area, by VTI measures 2.21 cm. Pulmonic Valve: The pulmonic valve was normal in structure. Pulmonic valve regurgitation is mild. No evidence of pulmonic stenosis. Aorta: The aortic root is normal in size and structure. Pulmonary Artery: The pulmonary artery is moderately dilated. Venous: The inferior vena cava is dilated in size with less than 50% respiratory variability, suggesting right atrial pressure of 15 mmHg. IAS/Shunts: No atrial level shunt detected by color flow Doppler. Additional Comments: A pacer wire is visualized. There is a moderate pleural effusion in the left lateral region.  LEFT VENTRICLE PLAX 2D LVIDd:         5.83 cm      Diastology LVIDs:         4.99 cm      LV e' lateral:   9.68 cm/s LV PW:         0.88 cm      LV E/e' lateral: 15.0 LV IVS:        0.84 cm      LV e' medial:    6.09 cm/s LVOT diam:     2.10  cm      LV E/e' medial:  23.9 LV SV:         71 LV SV Index:   31 LVOT Area:     3.46 cm  LV Volumes (MOD) LV vol d, MOD A4C: 221.0 ml LV vol s, MOD A4C: 101.0 ml LV SV MOD A4C:     221.0 ml RIGHT VENTRICLE RV Basal diam:  5.16 cm LEFT ATRIUM            Index       RIGHT ATRIUM           Index LA diam:      5.90 cm  2.57 cm/m  RA Area:     30.90 cm LA Vol (A2C): 85.6 ml  37.22 ml/m RA Volume:   112.00 ml 48.70 ml/m LA Vol (A4C): 148.0 ml 64.36 ml/m  AORTIC VALVE                    PULMONIC VALVE AV Area (Vmax):    2.15 cm     PV Vmax:       1.05 m/s AV Area (Vmean):   1.99 cm     PV Vmean:      69.000 cm/s AV Area (VTI):     2.21 cm     PV VTI:        0.216 m AV Vmax:           182.00 cm/s  PV Peak grad:  4.4 mmHg AV Vmean:          119.000 cm/s PV Mean grad:  2.0 mmHg AV VTI:            0.319 m AV Peak Grad:      13.2 mmHg AV Mean Grad:      7.0 mmHg LVOT Vmax:         113.00 cm/s LVOT Vmean:        68.400 cm/s LVOT VTI:  0.204 m LVOT/AV VTI ratio: 0.64  AORTA Ao Root diam: 4.10 cm MITRAL VALVE                TRICUSPID VALVE MV Area (PHT): 3.76 cm     TR Peak grad:   44.9 mmHg MV Peak grad:  9.9 mmHg     TR Vmax:        335.00 cm/s MV Mean grad:  3.0 mmHg MV Vmax:       1.57 m/s     SHUNTS MV Vmean:      72.1 cm/s    Systemic VTI:  0.20 m MV Decel Time: 202 msec     Systemic Diam: 2.10 cm MV E velocity: 145.67 cm/s Kathlyn Sacramento MD Electronically signed by Kathlyn Sacramento MD Signature Date/Time: 06/19/2019/1:07:51 PM    Final      Scheduled Meds: . pantoprazole  40 mg Oral Daily  . torsemide  40 mg Oral Daily   Continuous Infusions: . sodium chloride 10 mL/hr at 06/20/19 1002  . pencillin G potassium IV 3 Million Units (06/20/19 1609)     LOS: 4 days     Thomas Bi, MD [facility] If 7PM-7AM, please contact night-coverage 06/20/2019, 5:26 PM

## 2019-06-20 NOTE — Consult Note (Addendum)
Cardiology Consultation:   Patient ID: AZARI KADEN MRN: [id number]; DOB: Sep 27, 1934  Admit date: 06/16/2019 Date of Consult: 06/20/2019  Primary Care Provider: [name], MD Primary Cardiologist: Sanda Klein, MD  Primary Electrophysiologist:  None    Patient Profile:   Thomas [name] is a 85 y.o. male with a hx of coronary artery disease status post remote CABG with multiple PCI's, chronic systolic and diastolic heart failure due to ischemic cardiomyopathy status post CRT-D, permanent atrial fibrillation not on anticoagulation secondary to recurrent GI bleeds from AVMs, chronic kidney disease stage III, hypertension, hyperlipidemia, prostate cancer, and deafness, who is being seen today for the evaluation of group B streptococcus bacteremia at the request of Dr. Billie Ruddy.  History is obtained with the assistance of ASL interpreter.  History of Present Illness:   Thomas [name] was hospitalized last month with acute on chronic systolic and diastolic heart failure, requiring prolonged hospitalization for diuresis.  He was discharged on 05/30/2019 and presented to the [facility] emergency department on 06/16/2019 with fevers, chills, and lethargy.  He was noted to have a temperature of 102.2 by EMS.  He was subsequently found to have 2/4 blood cultures positive for group B streptococcus.  Thomas [name] reports that he otherwise has been feeling well, denying chest pain, shortness of breath, palpitations, and lightheadedness.  On further questioning, he endorses intermittent cough as well as slight worsening of his lower extremity edema.  All in all, he feels much better than when he was admitted last month.  He was initially placed on broad-spectrum antibiotics, which have been consolidated to penicillin.  Thomas [name] notes that he has had black stools for the last month.  He also endorses dysphagia with certain foods and pills.  He underwent EGD last year, which showed a hiatal hernia but otherwise no  significant esophageal pathology.  He has previously been diagnosed with small bowel AVMs due to recurrent GI bleeds.  CT scan during admission last month raised the possibility of cirrhosis.   Past Medical History:  Diagnosis Date  . AICD (automatic cardioverter/defibrillator) present   . Biventricular ICD (implantable cardioverter-defibrillator) in place 03/24/2005   Implantation of a Medtronic Adapta ADDRO1, serial number [id number]  . CHF (congestive heart failure) ([place])   . CKD (chronic kidney disease), stage III   . Coronary artery disease    a. s/p CABG 1986. b. Multiple PCIs/caths. c. 09/2013: s/p PTCA and BMS to SVG-OM.  [name]   . Dysrhythmia   . History of abdominal aortic aneurysm   . History of bleeding peptic ulcer 1980  . History of epididymitis 2013  . HTN (hypertension)   . Hydronephrosis with ureteropelvic junction obstruction   . Hydroureter on left 2009  . Hypertension   . Ischemic cardiomyopathy    a. Prior EF 30-35%, s/p BIV-ICD. b. 09/2013: EF 45-50%.  . Moderate tricuspid regurgitation   . PAF (paroxysmal atrial fibrillation) ([place])   . Presence of permanent cardiac pacemaker   . Prostate cancer ([place])   . Status post coronary artery bypass grafting 1986   LIMA to the LAD, SVG to OM, SVG to RCA  . Testicular swelling     Past Surgical History:  Procedure Laterality Date  . 2-D echocardiogram  11/20/2011   Ejection fraction 30-35% moderate concentric left ventricular hypertrophy. Left atrium is moderately dilated. Mild MR. Mild or  . BI-VENTRICULAR IMPLANTABLE CARDIOVERTER DEFIBRILLATOR N/A 12/16/2012   Procedure: BI-VENTRICULAR IMPLANTABLE CARDIOVERTER DEFIBRILLATOR  (CRT-D);  Surgeon: [name], MD;  Location: [place] CATH LAB;  Service: Cardiovascular;  Laterality: N/A;  . CARDIAC CATHETERIZATION  12/10/2011   SVG to OM widely patent.  LIMA to LAD patent  . CATARACT EXTRACTION W/PHACO Right 10/12/2017   Procedure: CATARACT EXTRACTION PHACO AND INTRAOCULAR  LENS PLACEMENT (IOC);  Surgeon: Birder Robson, MD;  Location: [facility] ORS;  Service: Ophthalmology;  Laterality: Right;  [place] 00:57 AP% 15.9 CDE 9.07 Fluid pack lot # [id number]  . COLONOSCOPY N/A 07/13/2018   Procedure: COLONOSCOPY;  Surgeon: Toledo, Benay Pike, MD;  Location: [facility] ENDOSCOPY;  Service: Gastroenterology;  Laterality: N/A;  . CORONARY ARTERY BYPASS GRAFT  1986  . ENTEROSCOPY N/A 09/14/2018   Procedure: ENTEROSCOPY;  Surgeon: Toledo, Benay Pike, MD;  Location: [facility] ENDOSCOPY;  Service: Gastroenterology;  Laterality: N/A;  symptomatic anemia, GI blood loss anemia, melena, positive small bowel capsule endoscopy showing source of bleeding   . ESOPHAGOGASTRODUODENOSCOPY N/A 07/13/2018   Procedure: ESOPHAGOGASTRODUODENOSCOPY (EGD);  Surgeon: Toledo, Benay Pike, MD;  Location: [facility] ENDOSCOPY;  Service: Gastroenterology;  Laterality: N/A;  . ESOPHAGOGASTRODUODENOSCOPY N/A 09/14/2018   Procedure: ESOPHAGOGASTRODUODENOSCOPY (EGD);  Surgeon: Toledo, Benay Pike, MD;  Location: [facility] ENDOSCOPY;  Service: Gastroenterology;  Laterality: N/A;  SIGN LANAGUAGE INTERPRETER  . ESOPHAGOGASTRODUODENOSCOPY (EGD) WITH PROPOFOL N/A 05/27/2018   Procedure: ESOPHAGOGASTRODUODENOSCOPY (EGD) WITH PROPOFOL;  Surgeon: Clarene Essex, MD;  Location: [place];  Service: Endoscopy;  Laterality: N/A;  . INSERT / REPLACE / REMOVE PACEMAKER    . LEFT HEART CATHETERIZATION WITH CORONARY/GRAFT ANGIOGRAM N/A 12/10/2011   Procedure: LEFT HEART CATHETERIZATION WITH Beatrix Fetters;  Surgeon: Sanda Klein, MD;  Location: [place] CATH LAB;  Service: Cardiovascular;  Laterality: N/A;  . LEFT HEART CATHETERIZATION WITH CORONARY/GRAFT ANGIOGRAM N/A 09/25/2013   Procedure: LEFT HEART CATHETERIZATION WITH Beatrix Fetters;  Surgeon: Blane Ohara, MD;  Location[facility] C[facility] LAB;  Service: Cardiovascular;  Laterality: N/A;  . Persantine Myoview  05/06/2010   Post-rest ejection fraction 30%. No significant ischemia demonstrated.  Compared to previous study there is no significant change.  . TRANSURETHRAL RESECTION OF PROSTATE     s/p     Home Medications:  Prior to Admission medications   Medication Sig Start Date Chinenye Katzenberger Date Taking? Authorizing Provider  albuterol (VENTOLIN HFA) 108 (90 Base) MCG/ACT inhaler Inhale 2 puffs into the lungs 4 (four) times daily as needed. Patient taking differently: Inhale 2 puffs into the lungs 4 (four) times daily as needed for wheezing or shortness of breath.  08/02/18  Yes Croitoru, Mihai, MD  [employer] ACETAMINOPHEN EX ST 500 MG tablet Take 500-1,000 mg by mouth every 6 (six) hours as needed for pain or fever. 04/26/19  Yes [provider]  ferrous sulfate 325 (65 FE) MG tablet Take 1 tablet (325 mg total) by mouth daily for 30 days. 05/28/18  Yes Elodia Florence., MD  isosorbide mononitrate (IMDUR) 30 MG 24 hr tablet Take 30 mg by mouth daily.   Yes [provider]  Multiple Vitamin (MULTIVITAMIN WITH MINERALS) TABS tablet Take 1 tablet by mouth daily. 05/29/19  Yes Nicole Kindred A, DO  simvastatin (ZOCOR) 20 MG tablet TAKE 1 TABLET BY MOUTH EVERY DAY Patient taking differently: Take 20 mg by mouth daily.  04/07/19  Yes Kilroy, Luke K, PA-C  torsemide (DEMADEX) 20 MG tablet Take 2 tablets (40 mg total) by mouth daily. 05/30/19  Yes Nicole Kindred A, DO  triamcinolone cream (KENALOG) 0.1 % Apply 1 application topically 2 (two) times daily as needed for itching. (avoid face, groin and axilla) 04/17/19  Yes [provider]    Inpatient Medications: Scheduled Meds: . pantoprazole  40 mg Oral Daily  . torsemide  40 mg Oral Daily   Continuous Infusions: . sodium chloride 10 mL/hr at 06/20/19 1002  . pencillin G potassium IV 3 Million Units (06/20/19 1609)   PRN Meds: sodium chloride, acetaminophen, albuterol  Allergies:    Allergies  Allergen Reactions  . Entresto [Sacubitril-Valsartan] Swelling    And bruising of arm  . Phenazopyridine Nausea Only and  Other (See Comments)    GI UPSET  . Ramipril Other (See Comments)    unk Other reaction(s): Other (See Comments), Unknown unk    Social History:   Social History   Socioeconomic History  . Marital status: Married    Spouse name: Not on file  . Number of children: Not on file  . Years of education: Not on file  . Highest education level: Not on file  Occupational History  . Not on file  Tobacco Use  . Smoking status: Former Smoker    Quit date: 03/15/1985    Years since quitting: 34.2  . Smokeless tobacco: Never Used  Substance and Sexual Activity  . Alcohol use: No    Comment: occas.  . Drug use: No  . Sexual activity: Yes  Other Topics Concern  . Not on file  Social History Narrative  . Not on file   Social Determinants of Health   Financial Resource Strain: Low Risk   . Difficulty of Paying Living Expenses: Not hard at all  Food Insecurity: No Food Insecurity  . Worried About Charity fundraiser in the Last Year: Never true  . Ran Out of Food in the Last Year: Never true  Transportation Needs: No Transportation Needs  . Lack of Transportation (Medical): No  . Lack of Transportation (Non-Medical): No  Physical Activity: Inactive  . Days of Exercise per Week: 0 days  . Minutes of Exercise per Session: 0 min  Stress: No Stress Concern Present  . Feeling of Stress : Not at all  Social Connections: Slightly Isolated  . Frequency of Communication with Friends and Family: Never  . Frequency of Social Gatherings with Friends and Family: Never  . Attends Religious Services: 1 to 4 times per year  . Active Member of Clubs or Organizations: Yes  . Attends Archivist Meetings: 1 to 4 times per year  . Marital Status: Married  Human resources officer Violence: Not At Risk  . Fear of Current or Ex-Partner: No  . Emotionally Abused: No  . Physically Abused: No  . Sexually Abused: No    Family History:   Family History  Problem Relation Age of Onset  .  Hypertension Father      ROS:  Please see the history of present illness. All other ROS reviewed and negative.     Physical Exam/Data:   Vitals:   06/19/19 1845 06/19/19 1917 06/20/19 0340 06/20/19 1135  BP: 128/71 103/60 113/63 104/65  Pulse: 65 60 (!) 59 61  Resp:  18 20   Temp: 98.2 F (36.8 C) 98.2 F (36.8 C) 98 F (36.7 C) 97.6 F (36.4 C)  TempSrc: Oral Oral Oral Oral  SpO2: 96% 98% 97% 96%  Weight:      Height:        Intake/Output Summary (Last 24 hours) at 06/20/2019 1654 Last data filed at 06/20/2019 1002 Gross per 24 hour  Intake 450.35 ml  Output 1550 ml  Net -1099.65 ml  Last 3 Weights 06/19/2019 06/18/2019 06/16/2019  Weight (lbs) 228 lb 6.3 oz 228 lb 6.3 oz 201 lb  Weight (kg) 103.6 kg 103.6 kg 91.173 kg     Body mass index is 29.32 kg/m.  General:  Well nourished, well developed, in no acute distress HEENT: normal Lymph: no adenopathy Neck: no JVD Endocrine:  No thryomegaly Vascular: No carotid bruits; FA pulses 2+ bilaterally without bruits  Cardiac:  Regular rate and rhythm with 3/6 systolic murmur.  No rubs or gallops. Lungs:  clear to auscultation bilaterally, no wheezing, rhonchi or rales  Abd: soft, nontender, no hepatomegaly  Ext: 2+ pitting edema to the mid calves bilaterally. Musculoskeletal:  No deformities, BUE and BLE strength normal and equal Skin: warm and dry  Neuro:  CNs 2-12 intact, no focal abnormalities noted Psych:  Normal affect   EKG:  The EKG was personally reviewed and demonstrates:  Ventricularly paced rhythm Telemetry:  Patient not on telemetry   Relevant CV Studies: TTE (06/19/19): 1. Left ventricular ejection fraction, by estimation, is 35 to 40%. The  left ventricle has moderately decreased function. The left ventricle has  no regional wall motion abnormalities. The left ventricular internal  cavity size was mildly to moderately  dilated. Left ventricular diastolic parameters are indeterminate.  2. Right ventricular  systolic function is mildly reduced. The right  ventricular size is normal. There is moderately elevated pulmonary artery  systolic pressure. The estimated right ventricular systolic pressure is  123456 mmHg.  3. Left atrial size was severely dilated.  4. Right atrial size was moderately dilated.  5. Moderate pleural effusion in the left lateral region.  6. The mitral valve is normal in structure. Mild mitral valve  regurgitation. No evidence of mitral stenosis.  7. Tricuspid valve regurgitation is moderate.  8. The aortic valve is normal in structure. Aortic valve regurgitation is  not visualized. Mild to moderate aortic valve sclerosis/calcification is  present, without any evidence of aortic stenosis.  9. Moderately dilated pulmonary artery.  10. The inferior vena cava is dilated in size with <50% respiratory  variability, suggesting right atrial pressure of 15 mmHg.  11. No clear vegetation but the RV pacemaker lead appears thinkened. A  vegetation can not be excluded. Consider TEE.   Laboratory Data:  High Sensitivity Troponin:  No results for input(s): TROPONINIHS in the last 720 hours.   Chemistry Recent Labs  Lab 06/18/19 0444 06/19/19 0534 06/20/19 0544  NA 138 140 142  K 3.3* 3.7 3.3*  CL 105 107 106  CO2 26 29 28   GLUCOSE 126* 114* 98  BUN 43* 39* 31*  CREATININE 1.57* 1.33* 1.14  CALCIUM 7.4* 7.8* 7.7*  GFRNONAA 40* 49* 60*  GFRAA 47* 57* >60  ANIONGAP 7 4* 8    Recent Labs  Lab 06/16/19 2052 06/17/19 0321  PROT 5.2* 4.4*  ALBUMIN 2.1* 1.7*  AST 51* 44*  ALT 17 15  ALKPHOS 100 80  BILITOT 1.1 1.1   Hematology Recent Labs  Lab 06/18/19 0444 06/19/19 0534 06/20/19 0544  WBC 13.6* 9.9 8.5  RBC 3.20* 3.52* 3.46*  HGB 7.6* 8.1* 8.0*  HCT 26.1* 28.5* 27.8*  MCV 81.6 81.0 80.3  MCH 23.8* 23.0* 23.1*  MCHC 29.1* 28.4* 28.8*  RDW 20.4* 20.1* 19.9*  PLT 162 195 203   BNP Recent Labs  Lab 06/16/19 2052  BNP 1,390.0*    DDimer No results  for input(s): DDIMER in the last 168 hours.   Radiology/Studies:  CT ABDOMEN  PELVIS WO CONTRAST  Result Date: 06/17/2019 CLINICAL DATA:  Vomiting. Diarrhea. EXAM: CT ABDOMEN AND PELVIS WITHOUT CONTRAST TECHNIQUE: Multidetector CT imaging of the abdomen and pelvis was performed following the standard protocol without IV contrast. COMPARISON:  Aug 14, 2018 FINDINGS: Lower chest: There are moderate-sized bilateral pleural effusions with adjacent atelectasis.There is massive cardiomegaly. Hepatobiliary: The liver surface appears somewhat nodular. Normal gallbladder.There is no biliary ductal dilation. Pancreas: Normal contours without ductal dilatation. No peripancreatic fluid collection. Spleen: The spleen is borderline enlarged. Adrenals/Urinary Tract: --Adrenal glands: No adrenal hemorrhage. --Right kidney/ureter: There is extensive scarring throughout the right kidney without evidence for hydronephrosis or radiopaque obstructing kidney stone. --Left kidney/ureter: There is no left-sided hydronephrosis. --Urinary bladder: Unremarkable. Stomach/Bowel: --Stomach/Duodenum: No hiatal hernia or other gastric abnormality. Normal duodenal course and caliber. --Small bowel: No dilatation or inflammation. --Colon: There is scattered colonic diverticula without CT evidence for diverticulitis. --Appendix: Normal. Vascular/Lymphatic: Extensive atherosclerotic changes are noted. The patient is status post EVAR. --No retroperitoneal lymphadenopathy. --No mesenteric lymphadenopathy. --No pelvic or inguinal lymphadenopathy. Reproductive: Unremarkable Other: No ascites or free air. The abdominal wall is normal. Musculoskeletal. No acute displaced fractures. IMPRESSION: 1. No acute abnormality detected within the abdomen or pelvis. 2. Cardiomegaly with moderate-sized bilateral pleural effusions. 3. Cirrhosis 4. There are additional chronic findings as detailed above. Aortic Atherosclerosis (ICD10-I70.0). Electronically Signed    By: Constance Holster M.D.   On: 06/17/2019 03:09   DG Chest Port 1 View  Result Date: 06/16/2019 CLINICAL DATA:  Fevers and lethargy EXAM: PORTABLE CHEST 1 VIEW COMPARISON:  05/19/2019 FINDINGS: Cardiac shadow is enlarged in size but stable. Defibrillator is again seen and stable. Postsurgical changes are again noted. Aortic calcifications are seen. Vascular congestion is again identified with mild edema. No focal infiltrate is seen. Stable left basilar atelectasis is noted. No acute bony abnormality is seen. IMPRESSION: Stable changes of vascular congestion.  No acute abnormality noted. Electronically Signed   By: Inez Catalina M.D.   On: 06/16/2019 21:39   ECHOCARDIOGRAM COMPLETE  Result Date: 06/19/2019    ECHOCARDIOGRAM REPORT   Patient Name:   Thomas [name] Date of Exam: 06/19/2019 Medical Rec #:  [id number]      Height:       74.0 in Accession #:    [id number]     Weight:       228.4 lb Date of Birth:  February 22, 1935     BSA:          2.300 m Patient Age:    44 years       BP:           116/77 mmHg Patient Gender: M              HR:           62 bpm. Exam Location:  [facility] Procedure: 2D Echo, Color Doppler and Cardiac Doppler Indications:     R78.81 Bacteremia  History:         Patient has prior history of Echocardiogram examinations, most                  recent 05/21/2019. Ischemic Cardiomyopathy and CHF, CAD, Prior                  CABG and Pacemaker, CKD; Risk Factors:Hypertension.  Sonographer:     Charmayne Sheer RDCS (AE) Referring Phys:  [id number] Otila Kluver LAI Diagnosing Phys: Kathlyn Sacramento MD IMPRESSIONS  1. Left ventricular ejection fraction, by estimation, is 35  to 40%. The left ventricle has moderately decreased function. The left ventricle has no regional wall motion abnormalities. The left ventricular internal cavity size was mildly to moderately dilated. Left ventricular diastolic parameters are indeterminate.  2. Right ventricular systolic function is mildly reduced. The right ventricular size is normal.  There is moderately elevated pulmonary artery systolic pressure. The estimated right ventricular systolic pressure is 123456 mmHg.  3. Left atrial size was severely dilated.  4. Right atrial size was moderately dilated.  5. Moderate pleural effusion in the left lateral region.  6. The mitral valve is normal in structure. Mild mitral valve regurgitation. No evidence of mitral stenosis.  7. Tricuspid valve regurgitation is moderate.  8. The aortic valve is normal in structure. Aortic valve regurgitation is not visualized. Mild to moderate aortic valve sclerosis/calcification is present, without any evidence of aortic stenosis.  9. Moderately dilated pulmonary artery. 10. The inferior vena cava is dilated in size with <50% respiratory variability, suggesting right atrial pressure of 15 mmHg. 11. No clear vegetation but the RV pacemaker lead appears thinkened. A vegetation can not be excluded. Consider TEE. FINDINGS  Left Ventricle: Left ventricular ejection fraction, by estimation, is 35 to 40%. The left ventricle has moderately decreased function. The left ventricle has no regional wall motion abnormalities. The left ventricular internal cavity size was mildly to moderately dilated. There is no left ventricular hypertrophy. Left ventricular diastolic parameters are indeterminate. Right Ventricle: The right ventricular size is normal. No increase in right ventricular wall thickness. Right ventricular systolic function is mildly reduced. There is moderately elevated pulmonary artery systolic pressure. The tricuspid regurgitant velocity is 3.35 m/s, and with an assumed right atrial pressure of 15 mmHg, the estimated right ventricular systolic pressure is 123456 mmHg. Left Atrium: Left atrial size was severely dilated. Right Atrium: Right atrial size was moderately dilated. Pericardium: There is no evidence of pericardial effusion. Mitral Valve: The mitral valve is normal in structure. Normal mobility of the mitral valve  leaflets. Mild mitral valve regurgitation. No evidence of mitral valve stenosis. MV peak gradient, 9.9 mmHg. The mean mitral valve gradient is 3.0 mmHg. Tricuspid Valve: The tricuspid valve is normal in structure. Tricuspid valve regurgitation is moderate . No evidence of tricuspid stenosis. Aortic Valve: The aortic valve is normal in structure. Aortic valve regurgitation is not visualized. Mild to moderate aortic valve sclerosis/calcification is present, without any evidence of aortic stenosis. Aortic valve mean gradient measures 7.0 mmHg. Aortic valve peak gradient measures 13.2 mmHg. Aortic valve area, by VTI measures 2.21 cm. Pulmonic Valve: The pulmonic valve was normal in structure. Pulmonic valve regurgitation is mild. No evidence of pulmonic stenosis. Aorta: The aortic root is normal in size and structure. Pulmonary Artery: The pulmonary artery is moderately dilated. Venous: The inferior vena cava is dilated in size with less than 50% respiratory variability, suggesting right atrial pressure of 15 mmHg. IAS/Shunts: No atrial level shunt detected by color flow Doppler. Additional Comments: A pacer wire is visualized. There is a moderate pleural effusion in the left lateral region.  LEFT VENTRICLE PLAX 2D LVIDd:         5.83 cm      Diastology LVIDs:         4.99 cm      LV e' lateral:   9.68 cm/s LV PW:         0.88 cm      LV E/e' lateral: 15.0 LV IVS:        0.84  cm      LV e' medial:    6.09 cm/s LVOT diam:     2.10 cm      LV E/e' medial:  23.9 LV SV:         71 LV SV Index:   31 LVOT Area:     3.46 cm  LV Volumes (MOD) LV vol d, MOD A4C: 221.0 ml LV vol s, MOD A4C: 101.0 ml LV SV MOD A4C:     221.0 ml RIGHT VENTRICLE RV Basal diam:  5.16 cm LEFT ATRIUM            Index       RIGHT ATRIUM           Index LA diam:      5.90 cm  2.57 cm/m  RA Area:     30.90 cm LA Vol (A2C): 85.6 ml  37.22 ml/m RA Volume:   112.00 ml 48.70 ml/m LA Vol (A4C): 148.0 ml 64.36 ml/m  AORTIC VALVE                     PULMONIC VALVE AV Area (Vmax):    2.15 cm     PV Vmax:       1.05 m/s AV Area (Vmean):   1.99 cm     PV Vmean:      69.000 cm/s AV Area (VTI):     2.21 cm     PV VTI:        0.216 m AV Vmax:           182.00 cm/s  PV Peak grad:  4.4 mmHg AV Vmean:          119.000 cm/s PV Mean grad:  2.0 mmHg AV VTI:            0.319 m AV Peak Grad:      13.2 mmHg AV Mean Grad:      7.0 mmHg LVOT Vmax:         113.00 cm/s LVOT Vmean:        68.400 cm/s LVOT VTI:          0.204 m LVOT/AV VTI ratio: 0.64  AORTA Ao Root diam: 4.10 cm MITRAL VALVE                TRICUSPID VALVE MV Area (PHT): 3.76 cm     TR Peak grad:   44.9 mmHg MV Peak grad:  9.9 mmHg     TR Vmax:        335.00 cm/s MV Mean grad:  3.0 mmHg MV Vmax:       1.57 m/s     SHUNTS MV Vmean:      72.1 cm/s    Systemic VTI:  0.20 m MV Decel Time: 202 msec     Systemic Diam: 2.10 cm MV E velocity: 145.67 cm/s Kathlyn Sacramento MD Electronically signed by Kathlyn Sacramento MD Signature Date/Time: 06/19/2019/1:07:51 PM    Final    Assessment and Plan:   Group B streptococcus bacteremia: Source unclear, but in the setting of native valvular heart disease and implanted cardiac device, endocarditis is certainly a concern.  I agree that TEE is indicated, but given patient's report of black stools over the last month, chronic anemia with history of GI bleeds, recent CT noting changes consistent with cirrhosis, and dysphagia, GI consultation should be obtained to comment on the safety of performing transesophageal echocardiogram.  Once GI thinks it is safe to  proceed, TEE with be arranged.  I have discussed the risks and benefits of TEE, and Thomas [name] is agreeable to proceeding.  Involvement of ID is recommended to ensure appropriate antibiotic dosing/duration and outpatient follow-up.  Acute on chronic systolic and diastolic heart failure: Thomas [name] has evidence of volume overload, including 2+ BLE edema and moderate pulmonary hypertension by echo (yesterday) as well as severely  elevated CVP.  I recommend switching torsemide to furosemide 60 mg IV BID.  As his blood pressure is soft, I will defer adding goal-directed medical therapy for his heart failure.  Coronary artery disease: No angina reported.  Continue secondary prevention with simvastatin.  No antiplatelet therapy in the setting of recurrent GI bleeds, chronic anemia, and report of recent melena.  Permanent atrial fibrillation: Rate controlled with ventricular pacing.  Patient not on anticoagulation given history of recurrent GI bleeds and chronic anemia.  For questions or updates, please contact [place] Please consult [email or web address] for contact info under [facility].  Signed, Nelva Bush, MD  06/20/2019 4:54 PM

## 2019-06-21 ENCOUNTER — Encounter
Admission: EM | Disposition: A | Discharge status: Home Health | Payer: Self-pay | Source: Home / Self Care | Attending: Hospitalist

## 2019-06-21 ENCOUNTER — Inpatient Hospital Stay: Payer: PPO | Admitting: Anesthesiology

## 2019-06-21 DIAGNOSIS — Z9861 Coronary angioplasty status: Secondary | ICD-10-CM

## 2019-06-21 DIAGNOSIS — I255 Ischemic cardiomyopathy: Secondary | ICD-10-CM

## 2019-06-21 HISTORY — PX: ESOPHAGOGASTRODUODENOSCOPY: SHX5428

## 2019-06-21 LAB — BASIC METABOLIC PANEL
Anion gap: 6 (ref 5–15)
BUN: 26 mg/dL — ABNORMAL HIGH (ref 8–23)
CO2: 27 mmol/L (ref 22–32)
Calcium: 7.8 mg/dL — ABNORMAL LOW (ref 8.9–10.3)
Chloride: 105 mmol/L (ref 98–111)
Creatinine, Ser: 1.1 mg/dL (ref 0.61–1.24)
GFR calc Af Amer: >60 mL/min (ref >60)
GFR calc non Af Amer: >60 mL/min (ref >60)
Glucose, Bld: 110 mg/dL — ABNORMAL HIGH (ref 70–99)
Potassium: 3.6 mmol/L (ref 3.5–5.1)
Sodium: 138 mmol/L (ref 135–145)

## 2019-06-21 LAB — CBC
HCT: 27.2 % — ABNORMAL LOW (ref 39.0–52.0)
Hemoglobin: 8 g/dL — ABNORMAL LOW (ref 13.0–17.0)
MCH: 23.4 pg — ABNORMAL LOW (ref 26.0–34.0)
MCHC: 29.4 g/dL — ABNORMAL LOW (ref 30.0–36.0)
MCV: 79.5 fL — ABNORMAL LOW (ref 80.0–100.0)
Platelets: 222 10*3/uL (ref 150–400)
RBC: 3.42 MIL/uL — ABNORMAL LOW (ref 4.22–5.81)
RDW: 19.9 % — ABNORMAL HIGH (ref 11.5–15.5)
WBC: 9.3 10*3/uL (ref 4.0–10.5)
nRBC: 0.3 % — ABNORMAL HIGH (ref 0.0–0.2)

## 2019-06-21 LAB — MAGNESIUM: Magnesium: 2.1 mg/dL (ref 1.7–2.4)

## 2019-06-21 SURGERY — EGD (ESOPHAGOGASTRODUODENOSCOPY)
Anesthesia: General

## 2019-06-21 MED ORDER — PROPOFOL 10 MG/ML IV BOLUS
INTRAVENOUS | Status: DC | PRN
  Administered 2019-06-21: 50 mg via INTRAVENOUS

## 2019-06-21 MED ORDER — SODIUM CHLORIDE 0.9 % IV SOLN: INTRAVENOUS | Status: DC

## 2019-06-21 MED ORDER — LIDOCAINE HCL (PF) 2 % IJ SOLN
INTRAMUSCULAR | Status: DC | PRN
  Administered 2019-06-21: 100 mg via INTRADERMAL

## 2019-06-21 MED ORDER — PROPOFOL 500 MG/50ML IV EMUL
INTRAVENOUS | Status: DC | PRN
  Administered 2019-06-21: 125 ug/kg/min via INTRAVENOUS

## 2019-06-21 MED ORDER — FUROSEMIDE 10 MG/ML IJ SOLN
40.0000 mg | Freq: Once | INTRAMUSCULAR | Status: AC
  Administered 2019-06-21: 40 mg via INTRAVENOUS
  Filled 2019-06-21: qty 4

## 2019-06-21 NOTE — Transfer of Care (Signed)
Immediate Anesthesia Transfer of Care Note  Patient: Thomas [name]  Procedure(s) Performed: ESOPHAGOGASTRODUODENOSCOPY (EGD) (N/A )  Patient Location: PACU  Anesthesia Type:General  Level of Consciousness: sedated  Airway & Oxygen Therapy: Patient Spontanous Breathing and Patient connected to nasal cannula oxygen  Post-op Assessment: Report given to RN and Post -op Vital signs reviewed and stable  Post vital signs: Reviewed and stable  Last Vitals:  Vitals Value Taken Time  BP 111/55 06/21/19 1354  Temp    Pulse 60 06/21/19 1357  Resp 17 06/21/19 1357  SpO2 100 % 06/21/19 1357  Vitals shown include unvalidated device data.  Last Pain:  Vitals:   06/21/19 1301  TempSrc: Temporal  PainSc:       Patients Stated Pain Goal: 0 ([id number])  Complications: No apparent anesthesia complications

## 2019-06-21 NOTE — Progress Notes (Signed)
PROGRESS NOTE    Thomas [name]  [id number] DOB: 1934/04/06 DOA: 06/16/2019 PCP: [name], MD    Assessment & Plan:   Principal Problem:   Group B streptococcal bacteriuria Active Problems:   Cardiomyopathy, ischemic   CAD Status post coronary artery bypass grafting: 1995   Permanent atrial fibrillation ([place])   Biventricular ICD in place   CAD S/P multiple PCIs   Bilateral deafness   Lymphedema   Anemia   Hypotension   Biventricular automatic implantable cardioverter defibrillator in situ   CRI (chronic renal insufficiency), stage 3 (moderate)   Anasarca   Leg swelling   SIRS (systemic inflammatory response syndrome) (HCC)   Bacteremia   AKI (acute kidney injury) ([place])   Lactic acidosis   Pressure ulcer, stage 2 ([place])    TIYON [name] is a 85 Thomas.o. Caucasian male with medical history significant for chronic a-fib, sCHF (ef 35-40%), ICD/pacemaker, htn, deaf, ckd3, anasarca, GIB 2/2 AVMs, CVA, and newly identified possible cirrhosis, presenting with chills, vomiting and diarrhea.   # Sepsis 2/2 Group B Strep bacteremia # Lactic acidosis, resolved - WBCs elevated, lactate elevated, with mild aki, and febrile.  No signs cellulitis. CXR clear. covid and flu negative. Urinalysis not suggestive of infection. No report of IV drug use.  CT a/p wo showed no acute finding. --started on vanc/zosyn on presentation. --2 of 4 bottles grew Strep AGALACTIAE --TTE showed thickened pacemaker lead, can not rule out endocarditis PLAN: - continue Penicillin, per pharm rec --f/u 2nd set of blood cx x2 from 4/4 (neg so far) --consult cardiology for TEE, will be performed tomorrow 4/8  --Consult ID tomorrow after TEE, for duration of abx  # Melena 2/2 upper GI bleed, POA # Chronic anemia with history of GI bleeds --patient reported black stools over the last month.  Recent CT noting changes consistent with cirrhosis. --EGD today found several duodenum AVM's, with some blood oozing,  treated with cauterization.  No varices.  # Acute on chronic systolic and diastolic heart failure - recent ef 35-40% # ICD/pacemaker in place # Mod pulm HTN --Wt (201) is same as at cardiologist's 2 wks ago, d/c wt was 199, and wife reported no change in edema. --Per cards, evidence of volume overload, including 2+ BLE edema and moderate pulmonary hypertension by echo as well as severely elevated CVP.  Also noted scrotal swelling. PLAN: - Hold carvedilol for now due to hypotension  - IV lasix 40 mg x1 today. - Avoid aggressive diuresis 2/2 low BP and AKI on presentation - daily weights, I/Os  # Hypoxia  - appears to be 2/2 acute illness, and also likely some fluid overload. --diurese as above --wean O2 as able   # hypokalemia - mild --replete PRN  # Acute kidney injury, POA, resolved - likely prerenal 2/2 sirs. Cr 1.76 from baseline of around 1.3.  # Deaf   # chronic a-fib  - recent d/c of anticoagulation given history of recurrent GI bleeds and chronic anemia. - Hold carvedilol for now due to hypotension HR low.  Rate controlled with ventricular pacing.   # Skin excoriations on buttocks, not pressure ulcers yet.  Coronary artery disease No angina reported.  Continue secondary prevention with simvastatin.  No antiplatelet therapy in the setting of recurrent GI bleeds, chronic anemia, and recent melena.   DVT prophylaxis: SCD/Compression stockings Code Status: Full code  Family Communication: Couldn't reach wife, so updated son on the phone today.  Disposition Plan: Will need TEE to  rule out vegetation, since it's not urgent, cardiology planned it for tomorrow.  PT eval to determine disposition, home vs SNF rehab.  When pt can be discharged will depend on finding on TEE.  If TEE is clean, then can place PICC line tomorrow and discharge to finish abx as outpatient.     Subjective and Interval History:  Pt had no complaints, was pleased GI stopped his bleeding AVM  today.  Pt indicated that his scrotal swelling is new.  No fever, dyspnea, chest pain, abdominal pain, N/V/D.     Objective: Vitals:   06/21/19 1424 06/21/19 1434 06/21/19 1444 06/21/19 1529  BP: 119/65 117/61 124/67 (!) 110/56  Pulse:  (!) 59 60 60  Resp: (!) 24 (!) 22 (!) 21 (!) 21  Temp:      TempSrc:      SpO2:  98% 99% 98%  Weight:      Height:        Intake/Output Summary (Last 24 hours) at 06/21/2019 1746 Last data filed at 06/21/2019 1347 Gross per 24 hour  Intake 200 ml  Output 1325 ml  Net -1125 ml   Filed Weights   06/16/19 2059 06/18/19 0540 06/19/19 0437  Weight: 91.2 kg 103.6 kg 103.6 kg    Examination:   Constitutional: NAD, alert HEENT: conjunctivae and lids normal, EOMI, deaf CV: RRR no M,R,G. Distal pulses +2.  No cyanosis.   RESP: CTA B/L, normal respiratory effort  GI: +BS, NTND Extremities: Lumpy, uneven pitting edema in BLE.  Left arm more swollen. SKIN: warm, dry and intact Neuro: II - XII grossly intact.  Sensation intact Psych: Normal mood and affect.     Data Reviewed: I have personally reviewed following labs and imaging studies  CBC: Recent Labs  Lab 06/16/19 2052 06/16/19 2052 06/17/19 0321 06/18/19 0444 06/19/19 0534 06/20/19 0544 06/21/19 0610  WBC 17.9*   < > 16.2* 13.6* 9.9 8.5 9.3  NEUTROABS 16.1*  --   --   --   --   --   --   HGB 8.5*   < > 7.5* 7.6* 8.1* 8.0* 8.0*  HCT 27.7*   < > 24.6* 26.1* 28.5* 27.8* 27.2*  MCV 78.0*   < > 78.1* 81.6 81.0 80.3 79.5*  PLT 206   < > 172 162 195 203 222   < > = values in this interval not displayed.   Basic Metabolic Panel: Recent Labs  Lab 06/17/19 0321 06/18/19 0444 06/19/19 0534 06/20/19 0544 06/21/19 0610  NA 139 138 140 142 138  K 3.3* 3.3* 3.7 3.3* 3.6  CL 104 105 107 106 105  CO2 26 26 29 28 27   GLUCOSE 127* 126* 114* 98 110*  BUN 36* 43* 39* 31* 26*  CREATININE 1.57* 1.57* 1.33* 1.14 1.10  CALCIUM 7.4* 7.4* 7.8* 7.7* 7.8*  MG 1.8 2.0 2.0 1.9 2.1   GFR: Estimated  Creatinine Clearance: 66.5 mL/min (by C-G formula based on SCr of 1.1 mg/dL). Liver Function Tests: Recent Labs  Lab 06/16/19 2052 06/17/19 0321  AST 51* 44*  ALT 17 15  ALKPHOS 100 80  BILITOT 1.1 1.1  PROT 5.2* 4.4*  ALBUMIN 2.1* 1.7*   Recent Labs  Lab 06/16/19 2052  LIPASE 17   No results for input(s): AMMONIA in the last 168 hours. Coagulation Profile: Recent Labs  Lab 06/16/19 2052  INR 1.4*   Cardiac Enzymes: No results for input(s): CKTOTAL, CKMB, CKMBINDEX, TROPONINI in the last 168 hours. BNP (last 3  results) No results for input(s): PROBNP in the last 8760 hours. HbA1C: No results for input(s): HGBA1C in the last 72 hours. CBG: No results for input(s): GLUCAP in the last 168 hours. Lipid Profile: No results for input(s): CHOL, HDL, LDLCALC, TRIG, CHOLHDL, LDLDIRECT in the last 72 hours. Thyroid Function Tests: No results for input(s): TSH, T4TOTAL, FREET4, T3FREE, THYROIDAB in the last 72 hours. Anemia Panel: No results for input(s): VITAMINB12, FOLATE, FERRITIN, TIBC, IRON, RETICCTPCT in the last 72 hours. Sepsis Labs: Recent Labs  Lab 06/16/19 2052 06/16/19 2354 06/17/19 1754  LATICACIDVEN 4.6* 3.5* 1.0    Recent Results (from the past 240 hour(s))  C Difficile Quick Screen w PCR reflex     Status: None   Collection Time: 06/16/19  2:06 AM   Specimen: STOOL  Result Value Ref Range Status   C Diff antigen NEGATIVE NEGATIVE Final   C Diff toxin NEGATIVE NEGATIVE Final   C Diff interpretation No C. difficile detected.  Final    Comment: Performed at [facility], [address] [place], [place] [address]  Gastrointestinal Panel by PCR , Stool     Status: None   Collection Time: 06/16/19  2:06 AM  Result Value Ref Range Status   Campylobacter species NOT DETECTED NOT DETECTED Final   Plesimonas shigelloides NOT DETECTED NOT DETECTED Final   Salmonella species NOT DETECTED NOT DETECTED Final   Yersinia enterocolitica NOT DETECTED NOT  DETECTED Final   Vibrio species NOT DETECTED NOT DETECTED Final   Vibrio cholerae NOT DETECTED NOT DETECTED Final   Enteroaggregative E coli (EAEC) NOT DETECTED NOT DETECTED Final   Enteropathogenic E coli (EPEC) NOT DETECTED NOT DETECTED Final   Enterotoxigenic E coli (ETEC) NOT DETECTED NOT DETECTED Final   Shiga like toxin producing E coli (STEC) NOT DETECTED NOT DETECTED Final   Shigella/Enteroinvasive E coli (EIEC) NOT DETECTED NOT DETECTED Final   Cryptosporidium NOT DETECTED NOT DETECTED Final   Cyclospora cayetanensis NOT DETECTED NOT DETECTED Final   Entamoeba histolytica NOT DETECTED NOT DETECTED Final   Giardia lamblia NOT DETECTED NOT DETECTED Final   Adenovirus F40/41 NOT DETECTED NOT DETECTED Final   Astrovirus NOT DETECTED NOT DETECTED Final   Norovirus GI/GII NOT DETECTED NOT DETECTED Final   Rotavirus A NOT DETECTED NOT DETECTED Final   Sapovirus (I, II, IV, and V) NOT DETECTED NOT DETECTED Final    Comment: Performed at [facility], [address] [place], [place] [address]  Blood Culture (routine x 2)     Status: Abnormal   Collection Time: 06/16/19  8:52 PM   Specimen: BLOOD  Result Value Ref Range Status   Specimen Description   Final    BLOOD LEFT FA Performed at [facility], [address], [place], [place] [address]    Special Requests   Final    BOTTLES DRAWN AEROBIC AND ANAEROBIC Blood Culture adequate volume Performed at [facility], [address] [place], [name] [address]    Culture  Setup Time   Final    IN BOTH AEROBIC AND ANAEROBIC BOTTLES GRAM POSITIVE COCCI IN PAIRS CRITICAL RESULT CALLED TO, READ BACK BY AND VERIFIED WITH: SCOTT HALL ON 06/17/19 AT 0420 [facility]    Culture (A)  Final    GROUP B STREP(S.AGALACTIAE)ISOLATED SUSCEPTIBILITIES PERFORMED ON PREVIOUS CULTURE WITHIN THE LAST 5 DAYS. Performed at [facility], [place] [address], [place],  [address]    Report Status 06/19/2019 FINAL  Final  Respiratory Panel by RT PCR (Flu A&B, Covid) - Nasopharyngeal Swab     Status: None   Collection Time: 06/16/19 10:02 PM   Specimen: Nasopharyngeal Swab  Result Value Ref Range Status   SARS Coronavirus 2 by RT PCR NEGATIVE NEGATIVE Final    Comment: (NOTE) SARS-CoV-2 target nucleic acids are NOT DETECTED. The SARS-CoV-2 RNA is generally detectable in upper respiratoy specimens during the acute phase of infection. The lowest concentration of SARS-CoV-2 viral copies this assay can detect is 131 copies/mL. A negative result does not preclude SARS-Cov-2 infection and should not be used as the sole basis for treatment or other patient management decisions. A negative result may occur with  improper specimen collection/handling, submission of specimen other than nasopharyngeal swab, presence of viral mutation(s) within the areas targeted by this assay, and inadequate number of viral copies (<131 copies/mL). A negative result must be combined with clinical observations, patient history, and epidemiological information. The expected result is Negative. Fact Sheet for Patients:  PinkCheek.be Fact Sheet for Healthcare Providers:  GravelBags.it This test is not yet ap proved or cleared by the [place] FDA and  has been authorized for detection and/or diagnosis of SARS-CoV-2 by FDA under an Emergency Use Authorization (EUA). This EUA will remain  in effect (meaning this test can be used) for the duration of the COVID-19 declaration under Section 564(b)(1) of the Act, 21 U.S.C. section 360bbb-3(b)(1), unless the authorization is terminated or revoked sooner.    Influenza A by PCR NEGATIVE NEGATIVE Final   Influenza B by PCR NEGATIVE NEGATIVE Final    Comment: (NOTE) The Xpert Xpress SARS-CoV-2/FLU/RSV assay is intended as an aid in  the diagnosis of influenza from Nasopharyngeal swab specimens and  should not be used as a sole  basis for treatment. Nasal washings and  aspirates are unacceptable for Xpert Xpress SARS-CoV-2/FLU/RSV  testing. Fact Sheet for Patients: PinkCheek.be Fact Sheet for Healthcare Providers: GravelBags.it This test is not yet approved or cleared by the [place] FDA and  has been authorized for detection and/or diagnosis of SARS-CoV-2 by  FDA under an Emergency Use Authorization (EUA). This EUA will remain  in effect (meaning this test can be used) for the duration of the  Covid-19 declaration under Section 564(b)(1) of the Act, 21  U.S.C. section 360bbb-3(b)(1), unless the authorization is  terminated or revoked. Performed at [facility], [address] [place], [place] [address]   Blood Culture (routine x 2)     Status: Abnormal   Collection Time: 06/16/19 10:03 PM   Specimen: BLOOD  Result Value Ref Range Status   Specimen Description   Final    BLOOD LEFT FA Performed at [facility], [address] [place], [place] [address]    Special Requests   Final    BOTTLES DRAWN AEROBIC AND ANAEROBIC Blood Culture adequate volume Performed at [facility], [address] [place], [place] [address]    Culture  Setup Time   Final    Organism ID to follow IN BOTH AEROBIC AND ANAEROBIC BOTTLES GRAM POSITIVE COCCI IN PAIRS CRITICAL VALUE NOTED.  VALUE IS CONSISTENT WITH PREVIOUSLY REPORTED AND CALLED VALUE. Performed at [facility], [place] [address] [place], [place] [address]    Culture GROUP B STREP(S.AGALACTIAE)ISOLATED (A)  Final   Report Status 06/19/2019 FINAL  Final   Organism ID, Bacteria GROUP B STREP(S.AGALACTIAE)ISOLATED  Final      Susceptibility   Group b strep(s.agalactiae)isolated - MIC*  CLINDAMYCIN >=1 RESISTANT Resistant     AMPICILLIN <=0.25 SENSITIVE Sensitive     ERYTHROMYCIN >=8 RESISTANT Resistant     VANCOMYCIN 0.5 SENSITIVE Sensitive     CEFTRIAXONE <=0.12  SENSITIVE Sensitive     LEVOFLOXACIN 1 SENSITIVE Sensitive     * GROUP B STREP(S.AGALACTIAE)ISOLATED  Blood Culture ID Panel (Reflexed)     Status: Abnormal   Collection Time: 06/16/19 10:03 PM  Result Value Ref Range Status   Enterococcus species NOT DETECTED NOT DETECTED Final   Listeria monocytogenes NOT DETECTED NOT DETECTED Final   Staphylococcus species NOT DETECTED NOT DETECTED Final   Staphylococcus aureus (BCID) NOT DETECTED NOT DETECTED Final   Streptococcus species DETECTED (A) NOT DETECTED Final    Comment: CRITICAL RESULT CALLED TO, READ BACK BY AND VERIFIED WITH: SCOTT HALL ON 06/17/19 AT 0608 [facility]    Streptococcus agalactiae DETECTED (A) NOT DETECTED Final    Comment: CRITICAL RESULT CALLED TO, READ BACK BY AND VERIFIED WITH: SCOTT HALL ON 06/17/19 AT 0608 [facility]    Streptococcus pneumoniae NOT DETECTED NOT DETECTED Final   Streptococcus pyogenes NOT DETECTED NOT DETECTED Final   Acinetobacter baumannii NOT DETECTED NOT DETECTED Final   Enterobacteriaceae species NOT DETECTED NOT DETECTED Final   Enterobacter cloacae complex NOT DETECTED NOT DETECTED Final   Escherichia coli NOT DETECTED NOT DETECTED Final   Klebsiella oxytoca NOT DETECTED NOT DETECTED Final   Klebsiella pneumoniae NOT DETECTED NOT DETECTED Final   Proteus species NOT DETECTED NOT DETECTED Final   Serratia marcescens NOT DETECTED NOT DETECTED Final   Haemophilus influenzae NOT DETECTED NOT DETECTED Final   Neisseria meningitidis NOT DETECTED NOT DETECTED Final   Pseudomonas aeruginosa NOT DETECTED NOT DETECTED Final   Candida albicans NOT DETECTED NOT DETECTED Final   Candida glabrata NOT DETECTED NOT DETECTED Final   Candida krusei NOT DETECTED NOT DETECTED Final   Candida parapsilosis NOT DETECTED NOT DETECTED Final   Candida tropicalis NOT DETECTED NOT DETECTED Final    Comment: Performed at [facility], [address], [place], [place] [address]  Urine culture     Status: None   Collection  Time: 06/16/19 11:04 PM   Specimen: In/Out Cath Urine  Result Value Ref Range Status   Specimen Description   Final    IN/OUT CATH URINE Performed at [facility], [address], [place], [place] [address]    Special Requests   Final    NONE Performed at [facility], [address] [place], [place] [address]    Culture   Final    NO GROWTH Performed at [facility], [place] [address] [place], [place] [address]    Report Status 06/18/2019 FINAL  Final  Culture, blood (Routine X 2) w Reflex to ID Panel     Status: None (Preliminary result)   Collection Time: 06/18/19  5:07 PM   Specimen: BLOOD  Result Value Ref Range Status   Specimen Description BLOOD BLOOD LEFT HAND  Final   Special Requests   Final    BOTTLES DRAWN AEROBIC AND ANAEROBIC Blood Culture adequate volume   Culture   Final    NO GROWTH 3 DAYS Performed at [facility], [address] [place], [place] [address]    Report Status PENDING  Incomplete  Culture, blood (Routine X 2) w Reflex to ID Panel     Status: None (Preliminary result)   Collection Time: 06/18/19  5:07 PM   Specimen: BLOOD  Result  Value Ref Range Status   Specimen Description BLOOD BLOOD RIGHT HAND  Final   Special Requests AEROBIC BOTTLE ONLY Blood Culture adequate volume  Final   Culture   Final    NO GROWTH 3 DAYS Performed at [facility], [address] [place], [place] [address]    Report Status PENDING  Incomplete      Radiology Studies: No results found.   Scheduled Meds: . pantoprazole  40 mg Oral Daily   Continuous Infusions: . sodium chloride 10 mL/hr at 06/20/19 1609  . pencillin G potassium IV 3 Million Units (06/21/19 1536)     LOS: 5 days     Enzo Bi, MD [facility] If 7PM-7AM, please contact night-coverage 06/21/2019, 5:46 PM

## 2019-06-21 NOTE — Anesthesia Preprocedure Evaluation (Signed)
Anesthesia Evaluation  Patient identified by MRN, date of birth, ID band Patient awake    Reviewed: Allergy & Precautions, NPO status , Patient's Chart, lab work & pertinent test results  History of Anesthesia Complications Negative for: history of anesthetic complications  Airway Mallampati: II  TM Distance: >3 FB Neck ROM: Full    Dental  (+) Poor Dentition, Chipped   Pulmonary neg sleep apnea, neg COPD, former smoker,    breath sounds clear to auscultation- rhonchi (-) wheezing      Cardiovascular hypertension, Pt. on medications + CAD, + Past MI, + CABG (1986) and +CHF  + dysrhythmias Atrial Fibrillation + pacemaker + Cardiac Defibrillator  Rhythm:Regular Rate:Normal - Systolic murmurs and - Diastolic murmurs Echo 123456: 1. Left ventricular ejection fraction, by estimation, is 35 to 40%. The  left ventricle has moderately decreased function. The left ventricle has  no regional wall motion abnormalities. The left ventricular internal  cavity size was mildly to moderately  dilated. Left ventricular diastolic parameters are indeterminate.  2. Right ventricular systolic function is mildly reduced. The right  ventricular size is normal. There is moderately elevated pulmonary artery  systolic pressure. The estimated right ventricular systolic pressure is  123456 mmHg.  3. Left atrial size was severely dilated.  4. Right atrial size was moderately dilated.  5. Moderate pleural effusion in the left lateral region.  6. The mitral valve is normal in structure. Mild mitral valve  regurgitation. No evidence of mitral stenosis.  7. Tricuspid valve regurgitation is moderate.  8. The aortic valve is normal in structure. Aortic valve regurgitation is  not visualized. Mild to moderate aortic valve sclerosis/calcification is  present, without any evidence of aortic stenosis.  9. Moderately dilated pulmonary artery.  10. The  inferior vena cava is dilated in size with <50% respiratory  variability, suggesting right atrial pressure of 15 mmHg.  11. No clear vegetation but the RV pacemaker lead appears thinkened. A  vegetation can not be excluded. Consider TEE.    Neuro/Psych neg Seizures negative neurological ROS  negative psych ROS   GI/Hepatic Neg liver ROS, PUD, GERD  ,  Endo/Other  negative endocrine ROSneg diabetes  Renal/GU negative Renal ROS     Musculoskeletal   Abdominal (+) - obese,   Peds  Hematology  (+) anemia ,   Anesthesia Other Findings Past Medical History: No date: AICD (automatic cardioverter/defibrillator) present 03/24/2005: Biventricular ICD (implantable cardioverter- defibrillator) in place     Comment:  Implantation of a [employer]onic Adapta ADDRO1, serial number              [id number] No date: CHF (congestive heart failure) (HCC) No date: CKD (chronic kidney disease), stage III No date: Coronary artery disease     Comment:  a. s/p CABG 1986. b. Multiple PCIs/caths. c. 09/2013: s/p              PTCA and BMS to SVG-OM. No date: Deaf No date: Dysrhythmia No date: History of abdominal aortic aneurysm 1980: History of bleeding peptic ulcer 2013: History of epididymitis No date: HTN (hypertension) No date: Hydronephrosis with ureteropelvic junction obstruction 2009: Hydroureter on left No date: Hypertension No date: Ischemic cardiomyopathy     Comment:  a. Prior EF 30-35%, s/p BIV-ICD. b. 09/2013: EF 45-50%. No date: Moderate tricuspid regurgitation No date: PAF (paroxysmal atrial fibrillation) (HCC) No date: Presence of permanent cardiac pacemaker No date: Prostate cancer ([place]) 1986: Status post coronary artery bypass grafting     Comment:  LIMA to the LAD, SVG to OM, SVG to RCA No date: Testicular swelling   Reproductive/Obstetrics                             Anesthesia Physical Anesthesia Plan  ASA: III  Anesthesia Plan: General    Post-op Pain Management:    Induction: Intravenous  PONV Risk Score and Plan: 1 and Propofol infusion  Airway Management Planned: Natural Airway  Additional Equipment:   Intra-op Plan:   Post-operative Plan:   Informed Consent: I have reviewed the patients History and Physical, chart, labs and discussed the procedure including the risks, benefits and alternatives for the proposed anesthesia with the patient or authorized representative who has indicated his/her understanding and acceptance.     Dental advisory given  Plan Discussed with: CRNA and Anesthesiologist  Anesthesia Plan Comments:         Anesthesia Quick Evaluation

## 2019-06-21 NOTE — Interval H&P Note (Signed)
History and Physical Interval Note:  06/21/2019 12:37 PM  Thomas [name]  has presented today for surgery, with the diagnosis of Melena, Vomiting, Hx of small bowel AVMs, Newly diagnosed cryptogenic cirrhosis.  The various methods of treatment have been discussed with the patient and family. After consideration of risks, benefits and other options for treatment, the patient has consented to  Procedure(s): ESOPHAGOGASTRODUODENOSCOPY (EGD) (N/A) as a surgical intervention.  The patient's history has been reviewed, patient examined, no change in status, stable for surgery.  I have reviewed the patient's chart and labs.  Questions were answered to the patient's satisfaction.     [place], [place]

## 2019-06-21 NOTE — Progress Notes (Signed)
Interpreter will be up on the floor at 9am with physical therapy to see the patient. RN has been notified that if anyone else wants to join during this time of day they can as the interpreter will be working with him.

## 2019-06-21 NOTE — Progress Notes (Addendum)
Progress Note  Patient Name: Thomas [name] Date of Encounter: 06/21/2019  Primary Cardiologist: Sanda Klein, MD   Subjective   No acute events over the past 24 hours.  No fevers or chills noted.  On IV antibiotics for group B strep bacteremia.  Inpatient Medications    Scheduled Meds: . pantoprazole  40 mg Oral Daily   Continuous Infusions: . sodium chloride 10 mL/hr at 06/20/19 1609  . pencillin G potassium IV 3 Million Units (06/21/19 1010)   PRN Meds: sodium chloride, acetaminophen, albuterol   Vital Signs    Vitals:   06/20/19 0340 06/20/19 1135 06/20/19 1946 06/21/19 0305  BP: 113/63 104/65 113/64 121/79  Pulse: (!) 59 61 61 81  Resp: 20  18 20   Temp: 98 F (36.7 C) 97.6 F (36.4 C) 97.9 F (36.6 C) 98 F (36.7 C)  TempSrc: Oral Oral Oral Oral  SpO2: 97% 96% 95% 91%  Weight:      Height:        Intake/Output Summary (Last 24 hours) at 06/21/2019 1107 Last data filed at 06/21/2019 0800 Gross per 24 hour  Intake 139.61 ml  Output 775 ml  Net -635.39 ml   Last 3 Weights 06/19/2019 06/18/2019 06/16/2019  Weight (lbs) 228 lb 6.3 oz 228 lb 6.3 oz 201 lb  Weight (kg) 103.6 kg 103.6 kg 91.173 kg      Telemetry    Currently off telemetry.  ECG    No new tracing observed- Personally Reviewed  Physical Exam   GEN: No acute distress.   Neck:  No JVD noted Cardiac:  Regular rhythm, systolic murmur, Respiratory:  Decreased breath sounds at bases GI: Soft, nontender, non-distended  MS: 1-2+ edema; No deformity. Neuro:  Nonfocal  Psych: Normal affect   Labs    High Sensitivity Troponin:   No results for input(s): TROPONINIHS in the last 720 hours.    Chemistry Recent Labs  Lab 06/16/19 2052 06/16/19 2052 06/17/19 0321 06/18/19 0444 06/19/19 0534 06/20/19 0544 06/21/19 0610  NA 140   < > 139   < > 140 142 138  K 3.3*   < > 3.3*   < > 3.7 3.3* 3.6  CL 102   < > 104   < > 107 106 105  CO2 26   < > 26   < > 29 28 27   GLUCOSE 118*   < > 127*   <  > 114* 98 110*  BUN 32*   < > 36*   < > 39* 31* 26*  CREATININE 1.76*   < > 1.57*   < > 1.33* 1.14 1.10  CALCIUM 8.1*   < > 7.4*   < > 7.8* 7.7* 7.8*  PROT 5.2*  --  4.4*  --   --   --   --   ALBUMIN 2.1*  --  1.7*  --   --   --   --   AST 51*  --  44*  --   --   --   --   ALT 17  --  15  --   --   --   --   ALKPHOS 100  --  80  --   --   --   --   BILITOT 1.1  --  1.1  --   --   --   --   GFRNONAA 35*   < > 40*   < > 49* 60* >60  GFRAA 41*   < >  47*   < > 57* >60 >60  ANIONGAP 12   < > 9   < > 4* 8 6   < > = values in this interval not displayed.     Hematology Recent Labs  Lab 06/19/19 0534 06/20/19 0544 06/21/19 0610  WBC 9.9 8.5 9.3  RBC 3.52* 3.46* 3.42*  HGB 8.1* 8.0* 8.0*  HCT 28.5* 27.8* 27.2*  MCV 81.0 80.3 79.5*  MCH 23.0* 23.1* 23.4*  MCHC 28.4* 28.8* 29.4*  RDW 20.1* 19.9* 19.9*  PLT 195 203 222    BNP Recent Labs  Lab 06/16/19 2052  BNP 1,390.0*     DDimer No results for input(s): DDIMER in the last 168 hours.   Radiology    ECHOCARDIOGRAM COMPLETE  Result Date: 06/19/2019    ECHOCARDIOGRAM REPORT   Patient Name:   [name] Date of Exam: 06/19/2019 Medical Rec #:  [id number]      Height:       74.0 in Accession #:    [id number]     Weight:       228.4 lb Date of Birth:  01-29-1935     BSA:          2.300 m Patient Age:    85 years       BP:           116/77 mmHg Patient Gender: M              HR:           62 bpm. Exam Location:  [facility] Procedure: 2D Echo, Color Doppler and Cardiac Doppler Indications:     R78.81 Bacteremia  History:         Patient has prior history of Echocardiogram examinations, most                  recent 05/21/2019. Ischemic Cardiomyopathy and CHF, CAD, Prior                  CABG and Pacemaker, CKD; Risk Factors:Hypertension.  Sonographer:     Charmayne Sheer RDCS (AE) Referring Phys:  [id number] Otila Kluver LAI Diagnosing Phys: Kathlyn Sacramento MD IMPRESSIONS  1. Left ventricular ejection fraction, by estimation, is 35 to 40%. The left ventricle has  moderately decreased function. The left ventricle has no regional wall motion abnormalities. The left ventricular internal cavity size was mildly to moderately dilated. Left ventricular diastolic parameters are indeterminate.  2. Right ventricular systolic function is mildly reduced. The right ventricular size is normal. There is moderately elevated pulmonary artery systolic pressure. The estimated right ventricular systolic pressure is 123456 mmHg.  3. Left atrial size was severely dilated.  4. Right atrial size was moderately dilated.  5. Moderate pleural effusion in the left lateral region.  6. The mitral valve is normal in structure. Mild mitral valve regurgitation. No evidence of mitral stenosis.  7. Tricuspid valve regurgitation is moderate.  8. The aortic valve is normal in structure. Aortic valve regurgitation is not visualized. Mild to moderate aortic valve sclerosis/calcification is present, without any evidence of aortic stenosis.  9. Moderately dilated pulmonary artery. 10. The inferior vena cava is dilated in size with <50% respiratory variability, suggesting right atrial pressure of 15 mmHg. 11. No clear vegetation but the RV pacemaker lead appears thinkened. A vegetation can not be excluded. Consider TEE. FINDINGS  Left Ventricle: Left ventricular ejection fraction, by estimation, is 35 to 40%. The left ventricle has moderately decreased function. The left ventricle  has no regional wall motion abnormalities. The left ventricular internal cavity size was mildly to moderately dilated. There is no left ventricular hypertrophy. Left ventricular diastolic parameters are indeterminate. Right Ventricle: The right ventricular size is normal. No increase in right ventricular wall thickness. Right ventricular systolic function is mildly reduced. There is moderately elevated pulmonary artery systolic pressure. The tricuspid regurgitant velocity is 3.35 m/s, and with an assumed right atrial pressure of 15 mmHg, the  estimated right ventricular systolic pressure is 123456 mmHg. Left Atrium: Left atrial size was severely dilated. Right Atrium: Right atrial size was moderately dilated. Pericardium: There is no evidence of pericardial effusion. Mitral Valve: The mitral valve is normal in structure. Normal mobility of the mitral valve leaflets. Mild mitral valve regurgitation. No evidence of mitral valve stenosis. MV peak gradient, 9.9 mmHg. The mean mitral valve gradient is 3.0 mmHg. Tricuspid Valve: The tricuspid valve is normal in structure. Tricuspid valve regurgitation is moderate . No evidence of tricuspid stenosis. Aortic Valve: The aortic valve is normal in structure. Aortic valve regurgitation is not visualized. Mild to moderate aortic valve sclerosis/calcification is present, without any evidence of aortic stenosis. Aortic valve mean gradient measures 7.0 mmHg. Aortic valve peak gradient measures 13.2 mmHg. Aortic valve area, by VTI measures 2.21 cm. Pulmonic Valve: The pulmonic valve was normal in structure. Pulmonic valve regurgitation is mild. No evidence of pulmonic stenosis. Aorta: The aortic root is normal in size and structure. Pulmonary Artery: The pulmonary artery is moderately dilated. Venous: The inferior vena cava is dilated in size with less than 50% respiratory variability, suggesting right atrial pressure of 15 mmHg. IAS/Shunts: No atrial level shunt detected by color flow Doppler. Additional Comments: A pacer wire is visualized. There is a moderate pleural effusion in the left lateral region.  LEFT VENTRICLE PLAX 2D LVIDd:         5.83 cm      Diastology LVIDs:         4.99 cm      LV e' lateral:   9.68 cm/s LV PW:         0.88 cm      LV E/e' lateral: 15.0 LV IVS:        0.84 cm      LV e' medial:    6.09 cm/s LVOT diam:     2.10 cm      LV E/e' medial:  23.9 LV SV:         71 LV SV Index:   31 LVOT Area:     3.46 cm  LV Volumes (MOD) LV vol d, MOD A4C: 221.0 ml LV vol s, MOD A4C: 101.0 ml LV SV MOD A4C:      221.0 ml RIGHT VENTRICLE RV Basal diam:  5.16 cm LEFT ATRIUM            Index       RIGHT ATRIUM           Index LA diam:      5.90 cm  2.57 cm/m  RA Area:     30.90 cm LA Vol (A2C): 85.6 ml  37.22 ml/m RA Volume:   112.00 ml 48.70 ml/m LA Vol (A4C): 148.0 ml 64.36 ml/m  AORTIC VALVE                    PULMONIC VALVE AV Area (Vmax):    2.15 cm     PV Vmax:       1.05 m/s  AV Area (Vmean):   1.99 cm     PV Vmean:      69.000 cm/s AV Area (VTI):     2.21 cm     PV VTI:        0.216 m AV Vmax:           182.00 cm/s  PV Peak grad:  4.4 mmHg AV Vmean:          119.000 cm/s PV Mean grad:  2.0 mmHg AV VTI:            0.319 m AV Peak Grad:      13.2 mmHg AV Mean Grad:      7.0 mmHg LVOT Vmax:         113.00 cm/s LVOT Vmean:        68.400 cm/s LVOT VTI:          0.204 m LVOT/AV VTI ratio: 0.64  AORTA Ao Root diam: 4.10 cm MITRAL VALVE                TRICUSPID VALVE MV Area (PHT): 3.76 cm     TR Peak grad:   44.9 mmHg MV Peak grad:  9.9 mmHg     TR Vmax:        335.00 cm/s MV Mean grad:  3.0 mmHg MV Vmax:       1.57 m/s     SHUNTS MV Vmean:      72.1 cm/s    Systemic VTI:  0.20 m MV Decel Time: 202 msec     Systemic Diam: 2.10 cm MV E velocity: 145.67 cm/s Kathlyn Sacramento MD Electronically signed by Kathlyn Sacramento MD Signature Date/Time: 06/19/2019/1:07:51 PM    Final     Cardiac Studies   Echocardiogram TTE (06/19/19): 1. Left ventricular ejection fraction, by estimation, is 35 to 40%. The  left ventricle has moderately decreased function. The left ventricle has  no regional wall motion abnormalities. The left ventricular internal  cavity size was mildly to moderately  dilated. Left ventricular diastolic parameters are indeterminate.  2. Right ventricular systolic function is mildly reduced. The right  ventricular size is normal. There is moderately elevated pulmonary artery  systolic pressure. The estimated right ventricular systolic pressure is  123456 mmHg.  3. Left atrial size was severely dilated.    4. Right atrial size was moderately dilated.  5. Moderate pleural effusion in the left lateral region.  6. The mitral valve is normal in structure. Mild mitral valve  regurgitation. No evidence of mitral stenosis.  7. Tricuspid valve regurgitation is moderate.  8. The aortic valve is normal in structure. Aortic valve regurgitation is  not visualized. Mild to moderate aortic valve sclerosis/calcification is  present, without any evidence of aortic stenosis.  9. Moderately dilated pulmonary artery.  10. The inferior vena cava is dilated in size with <50% respiratory  variability, suggesting right atrial pressure of 15 mmHg.  11. No clear vegetation but the RV pacemaker lead appears thinkened. A  vegetation can not be excluded. Consider TEE.   Patient Profile     85 y.o. male 85 year old gentleman with history of CAD status post CABG, PCI, heart failure reduced ejection fraction last EF 35 to 40%, status post ICD 2014, permanent A. fib not on anticoagulation due to GI bleed, deafness who is being seen for group B strep bacteremia  Assessment & Plan    1.  Group B strep bacteremia -On IV antibiotics -Due to history of GI bleed,  waiting input from GI regarding safety for procedure/TEE -We will plan for TEE when safe  2. HFrEF EF 35-40% -Continue Toprol, torsemide. -Sinus allergic to Entresto and ramipril.  3.  History of CAD status post CABG, PCI -Currently with no chest pain -Continue beta-blocker and statin.    4.  Permanent A. Fib -Heart rate controlled.  Currently paced rhythm. -Currently has microcytic anemia. -Not on anticoagulation due to history of GI bleeds, known AVM.  Total encounter time 35 minutes  Greater than 50% was spent in counseling and coordination of care with the patient and nursing staff.   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  Okay to proceed with TEE from a GI perspective.  We will plan  for TEE tomorrow.  Please keep n.p.o. midnight.  Discussed with patient via sign interpreter.  Nursing staff also made aware of plan.    Signed, Kate Sable, MD  06/21/2019, 11:07 AM

## 2019-06-21 NOTE — Anesthesia Postprocedure Evaluation (Signed)
Anesthesia Post Note  Patient: KNOLAN FALZON  Procedure(s) Performed: ESOPHAGOGASTRODUODENOSCOPY (EGD) (N/A )  Patient location during evaluation: Endoscopy Anesthesia Type: General Level of consciousness: awake and alert and oriented Pain management: pain level controlled Vital Signs Assessment: post-procedure vital signs reviewed and stable Respiratory status: spontaneous breathing, nonlabored ventilation and respiratory function stable Cardiovascular status: blood pressure returned to baseline and stable Postop Assessment: no signs of nausea or vomiting Anesthetic complications: no     Last Vitals:  Vitals:   06/21/19 1434 06/21/19 1444  BP: 117/61 124/67  Pulse: (!) 59 60  Resp: (!) 22 (!) 21  Temp:    SpO2: 98% 99%    Last Pain:  Vitals:   06/21/19 1434  TempSrc:   PainSc: 0-No pain                 Dayanna Pryce

## 2019-06-21 NOTE — Consult Note (Signed)
[facility] GI Inpatient Consult Note   Kathline Magic, M.D.  Reason for Consult: Melena, anemia   Attending Requesting Consult: Enzo Bi, M.D.  History of Present Illness: Thomas [name] is a 85 y.o. male presents with several days of intermittent melena, no abdominal pain, nausea or vomiting. No dysphagia. Some nondescript weight loss. Last EGD 09/2018 was remarkable to gastric vascular ectasias with some stigmata of bleeding. Cauterized with gold probe.   Past Medical History:  Past Medical History:  Diagnosis Date  . AICD (automatic cardioverter/defibrillator) present   . Biventricular ICD (implantable cardioverter-defibrillator) in place 03/24/2005   Implantation of a Medtronic Adapta ADDRO1, serial number [id number]  . CHF (congestive heart failure) ([place])   . CKD (chronic kidney disease), stage III   . Coronary artery disease    a. s/p CABG 1986. b. Multiple PCIs/caths. c. 09/2013: s/p PTCA and BMS to SVG-OM.  [name]   . Dysrhythmia   . History of abdominal aortic aneurysm   . History of bleeding peptic ulcer 1980  . History of epididymitis 2013  . HTN (hypertension)   . Hydronephrosis with ureteropelvic junction obstruction   . Hydroureter on left 2009  . Hypertension   . Ischemic cardiomyopathy    a. Prior EF 30-35%, s/p BIV-ICD. b. 09/2013: EF 45-50%.  . Moderate tricuspid regurgitation   . PAF (paroxysmal atrial fibrillation) ([place])   . Presence of permanent cardiac pacemaker   . Prostate cancer ([place])   . Status post coronary artery bypass grafting 1986   LIMA to the LAD, SVG to OM, SVG to RCA  . Testicular swelling     Problem List: Patient Active Problem List   Diagnosis Date Noted  . Group B streptococcal bacteriuria 06/18/2019  . AKI (acute kidney injury) ([place]) 06/18/2019  . Lactic acidosis 06/18/2019  . Pressure ulcer, stage 2 ([place]) 06/18/2019  . Bacteremia 06/17/2019  . SIRS (systemic inflammatory response syndrome) ([place]) 06/16/2019  . Leg  swelling   . Decompensated hepatic cirrhosis ([place]) 05/22/2019  . Anasarca 05/19/2019  . CRI (chronic renal insufficiency), stage 3 (moderate) 01/30/2019  . Iron deficiency anemia due to chronic blood loss 11/02/2018  . Coronary artery disease of bypass graft of native heart with stable angina pectoris ([place]) 11/02/2018  . Pure hypercholesterolemia 11/02/2018  . Biventricular automatic implantable cardioverter defibrillator in situ 11/02/2018  . GIB (gastrointestinal bleeding) 09/14/2018  . Hypotension 08/14/2018  . Acute on chronic systolic CHF (congestive heart failure) ([place]) 08/12/2018  . Acute on chronic combined systolic and diastolic CHF (congestive heart failure) ([place])   . GI bleed 05/26/2018  . Occult GI bleeding 05/25/2018  . Normocytic anemia 05/25/2018  . Elevated troponin 05/25/2018  . Anemia   . Lymphedema 02/28/2018  . Weakness 07/15/2016  . Fatigue 07/15/2016  . CVA (cerebral infarction) 10/31/2015  . Bulbous urethral stricture 09/18/2015  . Bilateral deafness 08/19/2015  . Bilateral cataracts 08/19/2015  . Acid reflux 08/19/2015  . HLD (hyperlipidemia) 08/19/2015  . Essential hypertension 08/19/2015  . Myocardial infarction ([place]) 08/19/2015  . Calculus of kidney 08/19/2015  . Artificial cardiac pacemaker 08/19/2015  . Gastroduodenal ulcer 08/19/2015  . Dupuytren's contracture of foot 08/19/2015  . Malignant neoplasm of prostate ([place]) 08/19/2015  . Microhematuria 08/19/2015  . CAD S/P multiple PCIs 02/22/2015  . Status post coronary artery bypass grafting 02/22/2015  . Benign essential HTN 04/02/2014  . Hematochezia 02/20/2014  . Tricuspid valve regurgitation, nonrheumatic 02/20/2014  . Mobitz type II atrioventricular block 12/12/2013  .  Long term current use of anticoagulant 10/18/2013  . Cardiomyopathy, ischemic 11/11/2012  . CAD Status post coronary artery bypass grafting: 1995 11/11/2012  . Permanent atrial fibrillation ([place]) 11/11/2012  . Biventricular  ICD in place 11/11/2012  . Chronic combined systolic and diastolic heart failure ([place]) 11/11/2012    Past Surgical History: Past Surgical History:  Procedure Laterality Date  . 2-D echocardiogram  11/20/2011   Ejection fraction 30-35% moderate concentric left ventricular hypertrophy. Left atrium is moderately dilated. Mild MR. Mild or  . BI-VENTRICULAR IMPLANTABLE CARDIOVERTER DEFIBRILLATOR N/A 12/16/2012   Procedure: BI-VENTRICULAR IMPLANTABLE CARDIOVERTER DEFIBRILLATOR  (CRT-D);  Surgeon: [name], MD;  Location: [facility] CATH LAB;  Service: Cardiovascular;  Laterality: N/A;  . CARDIAC CATHETERIZATION  12/10/2011   SVG to OM widely patent.  LIMA to LAD patent  . CATARACT EXTRACTION W/PHACO Right 10/12/2017   Procedure: CATARACT EXTRACTION PHACO AND INTRAOCULAR LENS PLACEMENT (IOC);  Surgeon: Birder Robson, MD;  Location: [facility] ORS;  Service: Ophthalmology;  Laterality: Right;  [place] 00:57 AP% 15.9 CDE 9.07 Fluid pack lot # [id number]  . COLONOSCOPY N/A 07/13/2018   Procedure: COLONOSCOPY;  Surgeon: Seville Downs, Benay Pike, MD;  Location: [facility] ENDOSCOPY;  Service: Gastroenterology;  Laterality: N/A;  . CORONARY ARTERY BYPASS GRAFT  1986  . ENTEROSCOPY N/A 09/14/2018   Procedure: ENTEROSCOPY;  Surgeon: Oriya Kettering, Benay Pike, MD;  Location: [facility] ENDOSCOPY;  Service: Gastroenterology;  Laterality: N/A;  symptomatic anemia, GI blood loss anemia, melena, positive small bowel capsule endoscopy showing source of bleeding   . ESOPHAGOGASTRODUODENOSCOPY N/A 07/13/2018   Procedure: ESOPHAGOGASTRODUODENOSCOPY (EGD);  Surgeon: Ashlynn Gunnels, Benay Pike, MD;  Location: [facility] ENDOSCOPY;  Service: Gastroenterology;  Laterality: N/A;  . ESOPHAGOGASTRODUODENOSCOPY N/A 09/14/2018   Procedure: ESOPHAGOGASTRODUODENOSCOPY (EGD);  Surgeon: Jedediah Noda, Benay Pike, MD;  Location: [facility] ENDOSCOPY;  Service: Gastroenterology;  Laterality: N/A;  SIGN LANAGUAGE INTERPRETER  . ESOPHAGOGASTRODUODENOSCOPY (EGD) WITH PROPOFOL N/A 05/27/2018   Procedure:  ESOPHAGOGASTRODUODENOSCOPY (EGD) WITH PROPOFOL;  Surgeon: Clarene Essex, MD;  Location: [place];  Service: Endoscopy;  Laterality: N/A;  . INSERT / REPLACE / REMOVE PACEMAKER    . LEFT HEART CATHETERIZATION WITH CORONARY/GRAFT ANGIOGRAM N/A 12/10/2011   Procedure: LEFT HEART CATHETERIZATION WITH Beatrix Fetters;  Surgeon: Sanda Klein, MD;  Location: [place] CATH LAB;  Service: Cardiovascular;  Laterality: N/A;  . LEFT HEART CATHETERIZATION WITH CORONARY/GRAFT ANGIOGRAM N/A 09/25/2013   Procedure: LEFT HEART CATHETERIZATION WITH Beatrix Fetters;  Surgeon: Blane Ohara, MD;  Location: [facility] CATH LAB;  Service: Cardiovascular;  Laterality: N/A;  . Persantine Myoview  05/06/2010   Post-rest ejection fraction 30%. No significant ischemia demonstrated. Compared to previous study there is no significant change.  . TRANSURETHRAL RESECTION OF PROSTATE     s/p    Allergies: Allergies  Allergen Reactions  . Entresto [Sacubitril-Valsartan] Swelling    And bruising of arm  . Phenazopyridine Nausea Only and Other (See Comments)    GI UPSET  . Ramipril Other (See Comments)    unk Other reaction(s): Other (See Comments), Unknown unk    Home Medications: Medications Prior to Admission  Medication Sig Dispense Refill Last Dose  . albuterol (VENTOLIN HFA) 108 (90 Base) MCG/ACT inhaler Inhale 2 puffs into the lungs 4 (four) times daily as needed. (Patient taking differently: Inhale 2 puffs into the lungs 4 (four) times daily as needed for wheezing or shortness of breath. ) 1 Inhaler 1 prn at prn  . [employer] ACETAMINOPHEN EX ST 500 MG tablet Take 500-1,000 mg by mouth every 6 (six) hours as  needed for pain or fever.   prn at prn  . ferrous sulfate 325 (65 FE) MG tablet Take 1 tablet (325 mg total) by mouth daily for 30 days. 30 tablet 0 unknown at unknown  . isosorbide mononitrate (IMDUR) 30 MG 24 hr tablet Take 30 mg by mouth daily.   unknown at unknown  . Multiple Vitamin (MULTIVITAMIN WITH  MINERALS) TABS tablet Take 1 tablet by mouth daily.   unknown at unknown  . simvastatin (ZOCOR) 20 MG tablet TAKE 1 TABLET BY MOUTH EVERY DAY (Patient taking differently: Take 20 mg by mouth daily. ) 90 tablet 2 unknown at unknown  . torsemide (DEMADEX) 20 MG tablet Take 2 tablets (40 mg total) by mouth daily. 30 tablet 1 unknown at unknown  . triamcinolone cream (KENALOG) 0.1 % Apply 1 application topically 2 (two) times daily as needed for itching. (avoid face, groin and axilla)   prn at prn   Home medication reconciliation was completed with the patient.   Scheduled Inpatient Medications:   . pantoprazole  40 mg Oral Daily    Continuous Inpatient Infusions:   . sodium chloride 10 mL/hr at 06/20/19 1609  . pencillin G potassium IV 3 Million Units (06/21/19 1010)    PRN Inpatient Medications:  sodium chloride, acetaminophen, albuterol  Family History: family history includes Hypertension in his father.   GI Family History: Negative  Social History:   reports that he quit smoking about 34 years ago. He has never used smokeless tobacco. He reports that he does not drink alcohol or use drugs. The patient denies ETOH, tobacco, or drug use.    Review of Systems: Review of Systems - Negative except HPI. Patient also has bacteremia which is being evaluated.   Physical Examination: BP 121/79 (BP Location: Left Arm)   Pulse 81   Temp 98 F (36.7 C) (Oral)   Resp 20   Ht 6\' 2"  (1.88 m)   Wt 103.6 kg   SpO2 91%   BMI 29.32 kg/m  Physical Exam Vitals reviewed.  Constitutional:      Appearance: Normal appearance.  HENT:     Head: Normocephalic and atraumatic.     Right Ear: Tympanic membrane normal.     Left Ear: Tympanic membrane normal.     Nose: Nose normal.     Mouth/Throat:     Mouth: Mucous membranes are moist.  Eyes:     Extraocular Movements: Extraocular movements intact.     Pupils: Pupils are equal, round, and reactive to light.  Cardiovascular:     Rate and  Rhythm: Normal rate.     Pulses: Normal pulses.  Abdominal:     General: Abdomen is flat. There is no distension.     Palpations: There is no mass.     Hernia: No hernia is present.  Musculoskeletal:     Cervical back: Normal range of motion.  Skin:    General: Skin is warm and dry.  Neurological:     Mental Status: He is alert. Mental status is at baseline.     Deep Tendon Reflexes: Reflexes are normal and symmetric.     Comments: Patient with bilateral deafness     Data: Lab Results  Component Value Date   WBC 9.3 06/21/2019   HGB 8.0 (L) 06/21/2019   HCT 27.2 (L) 06/21/2019   MCV 79.5 (L) 06/21/2019   PLT 222 06/21/2019   Recent Labs  Lab 06/19/19 0534 06/20/19 0544 06/21/19 0610  HGB 8.1* 8.0* 8.0*  Lab Results  Component Value Date   NA 138 06/21/2019   K 3.6 06/21/2019   CL 105 06/21/2019   CO2 27 06/21/2019   BUN 26 (H) 06/21/2019   CREATININE 1.10 06/21/2019   Lab Results  Component Value Date   ALT 15 06/17/2019   AST 44 (H) 06/17/2019   ALKPHOS 80 06/17/2019   BILITOT 1.1 06/17/2019   Recent Labs  Lab 06/16/19 2052  APTT 43*  INR 1.4*   CBC Latest Ref Rng & Units 06/21/2019 06/20/2019 06/19/2019  WBC 4.0 - 10.5 K/uL 9.3 8.5 9.9  Hemoglobin 13.0 - 17.0 g/dL 8.0(L) 8.0(L) 8.1(L)  Hematocrit 39.0 - 52.0 % 27.2(L) 27.8(L) 28.5(L)  Platelets 150 - 400 K/uL 222 203 195    STUDIES: No results found. @IMAGES @  Assessment: 1. Anemia secondary to GI blood loss - Likely from both gastric/duodenal avm's. 2. Bacteremia - Awaiting TEE. GI clearance requested by cardiology.  3. CHF 4. CKD Stage III 5. Sensorineural deafness.   COVID-19 status: Tested Negative  Recommendations: 1. EGD with Propofol. The patient understands the nature of the planned procedure, indications, risks, alternatives and potential complications including but not limited to bleeding, infection, perforation, damage to internal organs and possible oversedation/side effects from  anesthesia. The patient agrees and gives consent to proceed.  Please refer to procedure notes for findings, recommendations and patient disposition/instructions. 2. Further recommendations to follow.   Thank you for the consult. Please call with questions or concerns.  Olean Ree, "Lanny Hurst MD [facility] [place], [place] [address] [phone]  06/21/2019 12:07 PM

## 2019-06-21 NOTE — Anesthesia Procedure Notes (Signed)
Date/Time: 06/21/2019 1:49 PM Performed by: Nelda Marseille, CRNA Pre-anesthesia Checklist: Patient identified, Emergency Drugs available, Suction available, Patient being monitored and Timeout performed Oxygen Delivery Method: Nasal cannula

## 2019-06-21 NOTE — Op Note (Signed)
[facility] Patient Name: Thomas [name] Procedure Date: 06/21/2019 1:17 PM MRN: [id number] Account #: [id number] Date of Birth: [birth year] Admit Type: Outpatient Age: 85 Room: [facility] ENDO ROOM 3 Gender: Male Note Status: Finalized Procedure:             Upper GI endoscopy Indications:           Iron deficiency anemia secondary to chronic blood                         loss, Melena, Portal venous hypertension, cryptogenic                         cirrhosis Providers:             Lorie Apley K. Cylus Douville MD, MD Medicines:             Propofol per Anesthesia Complications:         No immediate complications. Estimated blood loss:                         Minimal. Procedure:             Pre-Anesthesia Assessment:                        - The risks and benefits of the procedure and the                         sedation options and risks were discussed with the                         patient. All questions were answered and informed                         consent was obtained.                        - Patient identification and proposed procedure were                         verified prior to the procedure by the nurse. The                         procedure was verified in the procedure room.                        - ASA Grade Assessment: III - A patient with severe                         systemic disease.                        - After reviewing the risks and benefits, the patient                         was deemed in satisfactory condition to undergo the                         procedure.  After obtaining informed consent, the endoscope was                         passed under direct vision. Throughout the procedure,                         the patient's blood pressure, pulse, and oxygen                         saturations were monitored continuously. The Endoscope                         was introduced through the mouth, and advanced to  the                         third part of duodenum. The upper GI endoscopy was                         accomplished without difficulty. The patient tolerated                         the procedure well. Findings:      The examined esophagus was normal.      There is no endoscopic evidence of stricture, varices, signs of external       compression, mass or diverticula in the entire esophagus.      Diffuse atrophic mucosa was found in the cardia and in the gastric       fundus.      There is no endoscopic evidence of varices in the cardia and in the       gastric fundus.      Five small angioectasias with bleeding on contact were found in the       first portion of the duodenum and in the second portion of the duodenum.       Coagulation for hemostasis using argon plasma at 1 liter/minute and 30       watts was successful. Estimated blood loss was minimal.      The exam was otherwise without abnormality. Impression:            - Normal esophagus.                        - Gastric mucosal atrophy.                        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 Treated with                         argon plasma coagulation (APC).                        - The examination was otherwise normal.                        - No specimens collected. Recommendation:        - Return patient to hospital ward for ongoing care.                        - Proceed with TEE as per cardiology. No high risk  lesions in the esophagus or stomach to complicate TEE. Procedure Code(s):     --- Professional ---                        [phone], Esophagogastroduodenoscopy, flexible,                         transoral; with control of bleeding, any method Diagnosis Code(s):     --- Professional ---                        K92.1, Melena (includes Hematochezia)                        D50.0, Iron deficiency anemia secondary to blood loss                         (chronic)                        K31.819,  Angiodysplasia of stomach and duodenum                         without bleeding                        K31.89, Other diseases of stomach and duodenum CPT copyright 2019 [employer] Association. All rights reserved. The codes documented in this report are preliminary and upon coder review may  be revised to meet current compliance requirements. Efrain Sella MD, MD 06/21/2019 1:52:32 PM This report has been signed electronically. Number of Addenda: 0 Note Initiated On: 06/21/2019 1:17 PM Estimated Blood Loss:  Estimated blood loss was minimal.      [facility]

## 2019-06-21 NOTE — Progress Notes (Signed)
Cancellation Note:  PT consult received.  Chart reviewed.  Pt currently off floor for procedure.  Will re-attempt PT evaluation at a later date/time as medically appropriate.  Leitha Bleak, PT 06/21/19, 1:27 PM

## 2019-06-21 NOTE — Progress Notes (Signed)
The patient has alot of swelling in scrotun and he indicates that it is new to him. MD has been notified.

## 2019-06-21 NOTE — Interval H&P Note (Signed)
History and Physical Interval Note:  06/21/2019 1:16 PM  Thomas [name]  has presented today for surgery, with the diagnosis of Melena, Vomiting, Hx of small bowel AVMs, Newly diagnosed cryptogenic cirrhosis.  The various methods of treatment have been discussed with the patient and family. After consideration of risks, benefits and other options for treatment, the patient has consented to  Procedure(s): ESOPHAGOGASTRODUODENOSCOPY (EGD) (N/A) as a surgical intervention.  The patient's history has been reviewed, patient examined, no change in status, stable for surgery.  I have reviewed the patient's chart and labs.  Questions were answered to the patient's satisfaction.     [place], [place]

## 2019-06-21 NOTE — H&P (View-Only) (Signed)
[facility] GI Inpatient Consult Note   Kathline Magic, M.D.  Reason for Consult: Melena, anemia   Attending Requesting Consult: Enzo Bi, M.D.  History of Present Illness: Thomas [name] is a 85 y.o. male presents with several days of intermittent melena, no abdominal pain, nausea or vomiting. No dysphagia. Some nondescript weight loss. Last EGD 09/2018 was remarkable to gastric vascular ectasias with some stigmata of bleeding. Cauterized with gold probe.   Past Medical History:  Past Medical History:  Diagnosis Date  . AICD (automatic cardioverter/defibrillator) present   . Biventricular ICD (implantable cardioverter-defibrillator) in place 03/24/2005   Implantation of a Medtronic Adapta ADDRO1, serial number [id number]  . CHF (congestive heart failure) ([place])   . CKD (chronic kidney disease), stage III   . Coronary artery disease    a. s/p CABG 1986. b. Multiple PCIs/caths. c. 09/2013: s/p PTCA and BMS to SVG-OM.  [name]   . Dysrhythmia   . History of abdominal aortic aneurysm   . History of bleeding peptic ulcer 1980  . History of epididymitis 2013  . HTN (hypertension)   . Hydronephrosis with ureteropelvic junction obstruction   . Hydroureter on left 2009  . Hypertension   . Ischemic cardiomyopathy    a. Prior EF 30-35%, s/p BIV-ICD. b. 09/2013: EF 45-50%.  . Moderate tricuspid regurgitation   . PAF (paroxysmal atrial fibrillation) ([place])   . Presence of permanent cardiac pacemaker   . Prostate cancer ([place])   . Status post coronary artery bypass grafting 1986   LIMA to the LAD, SVG to OM, SVG to RCA  . Testicular swelling     Problem List: Patient Active Problem List   Diagnosis Date Noted  . Group B streptococcal bacteriuria 06/18/2019  . AKI (acute kidney injury) ([place]) 06/18/2019  . Lactic acidosis 06/18/2019  . Pressure ulcer, stage 2 ([place]) 06/18/2019  . Bacteremia 06/17/2019  . SIRS (systemic inflammatory response syndrome) ([place]) 06/16/2019  . Leg  swelling   . Decompensated hepatic cirrhosis ([place]) 05/22/2019  . Anasarca 05/19/2019  . CRI (chronic renal insufficiency), stage 3 (moderate) 01/30/2019  . Iron deficiency anemia due to chronic blood loss 11/02/2018  . Coronary artery disease of bypass graft of native heart with stable angina pectoris ([place]) 11/02/2018  . Pure hypercholesterolemia 11/02/2018  . Biventricular automatic implantable cardioverter defibrillator in situ 11/02/2018  . GIB (gastrointestinal bleeding) 09/14/2018  . Hypotension 08/14/2018  . Acute on chronic systolic CHF (congestive heart failure) ([place]) 08/12/2018  . Acute on chronic combined systolic and diastolic CHF (congestive heart failure) ([place])   . GI bleed 05/26/2018  . Occult GI bleeding 05/25/2018  . Normocytic anemia 05/25/2018  . Elevated troponin 05/25/2018  . Anemia   . Lymphedema 02/28/2018  . Weakness 07/15/2016  . Fatigue 07/15/2016  . CVA (cerebral infarction) 10/31/2015  . Bulbous urethral stricture 09/18/2015  . Bilateral deafness 08/19/2015  . Bilateral cataracts 08/19/2015  . Acid reflux 08/19/2015  . HLD (hyperlipidemia) 08/19/2015  . Essential hypertension 08/19/2015  . Myocardial infarction ([place]) 08/19/2015  . Calculus of kidney 08/19/2015  . Artificial cardiac pacemaker 08/19/2015  . Gastroduodenal ulcer 08/19/2015  . Dupuytren's contracture of foot 08/19/2015  . Malignant neoplasm of prostate ([place]) 08/19/2015  . Microhematuria 08/19/2015  . CAD S/P multiple PCIs 02/22/2015  . Status post coronary artery bypass grafting 02/22/2015  . Benign essential HTN 04/02/2014  . Hematochezia 02/20/2014  . Tricuspid valve regurgitation, nonrheumatic 02/20/2014  . Mobitz type II atrioventricular block 12/12/2013  .  Long term current use of anticoagulant 10/18/2013  . Cardiomyopathy, ischemic 11/11/2012  . CAD Status post coronary artery bypass grafting: 1995 11/11/2012  . Permanent atrial fibrillation ([place]) 11/11/2012  . Biventricular  ICD in place 11/11/2012  . Chronic combined systolic and diastolic heart failure ([place]) 11/11/2012    Past Surgical History: Past Surgical History:  Procedure Laterality Date  . 2-D echocardiogram  11/20/2011   Ejection fraction 30-35% moderate concentric left ventricular hypertrophy. Left atrium is moderately dilated. Mild MR. Mild or  . BI-VENTRICULAR IMPLANTABLE CARDIOVERTER DEFIBRILLATOR N/A 12/16/2012   Procedure: BI-VENTRICULAR IMPLANTABLE CARDIOVERTER DEFIBRILLATOR  (CRT-D);  Surgeon: [name], MD;  Location[facility] CATH LAB;  Service: Cardiovascular;  Laterality: N/A;  . CARDIAC CATHETERIZATION  12/10/2011   SVG to OM widely patent.  LIMA to LAD patent  . CATARACT EXTRACTION W/PHACO Right 10/12/2017   Procedure: CATARACT EXTRACTION PHACO AND INTRAOCULAR LENS PLACEMENT (IOC);  Surgeon: Birder Robson, MD;  Location: [facility] ORS;  Service: Ophthalmology;  Laterality: Right;  [place] 00:57 AP% 15.9 CDE 9.07 Fluid pack lot # [id number]  . COLONOSCOPY N/A 07/13/2018   Procedure: COLONOSCOPY;  Surgeon: Huma Imhoff, Benay Pike, MD;  Location: [facility] ENDOSCOPY;  Service: Gastroenterology;  Laterality: N/A;  . CORONARY ARTERY BYPASS GRAFT  1986  . ENTEROSCOPY N/A 09/14/2018   Procedure: ENTEROSCOPY;  Surgeon: Cloys Vera, Benay Pike, MD;  Location: [facility] ENDOSCOPY;  Service: Gastroenterology;  Laterality: N/A;  symptomatic anemia, GI blood loss anemia, melena, positive small bowel capsule endoscopy showing source of bleeding   . ESOPHAGOGASTRODUODENOSCOPY N/A 07/13/2018   Procedure: ESOPHAGOGASTRODUODENOSCOPY (EGD);  Surgeon: Sheccid Lahmann, Benay Pike, MD;  Location: [facility] ENDOSCOPY;  Service: Gastroenterology;  Laterality: N/A;  . ESOPHAGOGASTRODUODENOSCOPY N/A 09/14/2018   Procedure: ESOPHAGOGASTRODUODENOSCOPY (EGD);  Surgeon: Sammie Denner, Benay Pike, MD;  Location: [facility] ENDOSCOPY;  Service: Gastroenterology;  Laterality: N/A;  SIGN LANAGUAGE INTERPRETER  . ESOPHAGOGASTRODUODENOSCOPY (EGD) WITH PROPOFOL N/A 05/27/2018   Procedure:  ESOPHAGOGASTRODUODENOSCOPY (EGD) WITH PROPOFOL;  Surgeon: Clarene Essex, MD;  Location: [place];  Service: Endoscopy;  Laterality: N/A;  . INSERT / REPLACE / REMOVE PACEMAKER    . LEFT HEART CATHETERIZATION WITH CORONARY/GRAFT ANGIOGRAM N/A 12/10/2011   Procedure: LEFT HEART CATHETERIZATION WITH Beatrix Fetters;  Surgeon: Sanda Klein, MD;  Location: [facility] CATH LAB;  Service: Cardiovascular;  Laterality: N/A;  . LEFT HEART CATHETERIZATION WITH CORONARY/GRAFT ANGIOGRAM N/A 09/25/2013   Procedure: LEFT HEART CATHETERIZATION WITH Beatrix Fetters;  Surgeon: Blane Ohara, MD;  Location: [facility] CATH LAB;  Service: Cardiovascular;  Laterality: N/A;  . Persantine Myoview  05/06/2010   Post-rest ejection fraction 30%. No significant ischemia demonstrated. Compared to previous study there is no significant change.  . TRANSURETHRAL RESECTION OF PROSTATE     s/p    Allergies: Allergies  Allergen Reactions  . Entresto [Sacubitril-Valsartan] Swelling    And bruising of arm  . Phenazopyridine Nausea Only and Other (See Comments)    GI UPSET  . Ramipril Other (See Comments)    unk Other reaction(s): Other (See Comments), Unknown unk    Home Medications: Medications Prior to Admission  Medication Sig Dispense Refill Last Dose  . albuterol (VENTOLIN HFA) 108 (90 Base) MCG/ACT inhaler Inhale 2 puffs into the lungs 4 (four) times daily as needed. (Patient taking differently: Inhale 2 puffs into the lungs 4 (four) times daily as needed for wheezing or shortness of breath. ) 1 Inhaler 1 prn at prn  . [employer] ACETAMINOPHEN EX ST 500 MG tablet Take 500-1,000 mg by mouth every 6 (six) hours as  needed for pain or fever.   prn at prn  . ferrous sulfate 325 (65 FE) MG tablet Take 1 tablet (325 mg total) by mouth daily for 30 days. 30 tablet 0 unknown at unknown  . isosorbide mononitrate (IMDUR) 30 MG 24 hr tablet Take 30 mg by mouth daily.   unknown at unknown  . Multiple Vitamin (MULTIVITAMIN WITH  MINERALS) TABS tablet Take 1 tablet by mouth daily.   unknown at unknown  . simvastatin (ZOCOR) 20 MG tablet TAKE 1 TABLET BY MOUTH EVERY DAY (Patient taking differently: Take 20 mg by mouth daily. ) 90 tablet 2 unknown at unknown  . torsemide (DEMADEX) 20 MG tablet Take 2 tablets (40 mg total) by mouth daily. 30 tablet 1 unknown at unknown  . triamcinolone cream (KENALOG) 0.1 % Apply 1 application topically 2 (two) times daily as needed for itching. (avoid face, groin and axilla)   prn at prn   Home medication reconciliation was completed with the patient.   Scheduled Inpatient Medications:   . pantoprazole  40 mg Oral Daily    Continuous Inpatient Infusions:   . sodium chloride 10 mL/hr at 06/20/19 1609  . pencillin G potassium IV 3 Million Units (06/21/19 1010)    PRN Inpatient Medications:  sodium chloride, acetaminophen, albuterol  Family History: family history includes Hypertension in his father.   GI Family History: Negative  Social History:   reports that he quit smoking about 34 years ago. He has never used smokeless tobacco. He reports that he does not drink alcohol or use drugs. The patient denies ETOH, tobacco, or drug use.    Review of Systems: Review of Systems - Negative except HPI. Patient also has bacteremia which is being evaluated.   Physical Examination: BP 121/79 (BP Location: Left Arm)   Pulse 81   Temp 98 F (36.7 C) (Oral)   Resp 20   Ht 6\' 2"  (1.88 m)   Wt 103.6 kg   SpO2 91%   BMI 29.32 kg/m  Physical Exam Vitals reviewed.  Constitutional:      Appearance: Normal appearance.  HENT:     Head: Normocephalic and atraumatic.     Right Ear: Tympanic membrane normal.     Left Ear: Tympanic membrane normal.     Nose: Nose normal.     Mouth/Throat:     Mouth: Mucous membranes are moist.  Eyes:     Extraocular Movements: Extraocular movements intact.     Pupils: Pupils are equal, round, and reactive to light.  Cardiovascular:     Rate and  Rhythm: Normal rate.     Pulses: Normal pulses.  Abdominal:     General: Abdomen is flat. There is no distension.     Palpations: There is no mass.     Hernia: No hernia is present.  Musculoskeletal:     Cervical back: Normal range of motion.  Skin:    General: Skin is warm and dry.  Neurological:     Mental Status: He is alert. Mental status is at baseline.     Deep Tendon Reflexes: Reflexes are normal and symmetric.     Comments: Patient with bilateral deafness     Data: Lab Results  Component Value Date   WBC 9.3 06/21/2019   HGB 8.0 (L) 06/21/2019   HCT 27.2 (L) 06/21/2019   MCV 79.5 (L) 06/21/2019   PLT 222 06/21/2019   Recent Labs  Lab 06/19/19 0534 06/20/19 0544 06/21/19 0610  HGB 8.1* 8.0* 8.0*  Lab Results  Component Value Date   NA 138 06/21/2019   K 3.6 06/21/2019   CL 105 06/21/2019   CO2 27 06/21/2019   BUN 26 (H) 06/21/2019   CREATININE 1.10 06/21/2019   Lab Results  Component Value Date   ALT 15 06/17/2019   AST 44 (H) 06/17/2019   ALKPHOS 80 06/17/2019   BILITOT 1.1 06/17/2019   Recent Labs  Lab 06/16/19 2052  APTT 43*  INR 1.4*   CBC Latest Ref Rng & Units 06/21/2019 06/20/2019 06/19/2019  WBC 4.0 - 10.5 K/uL 9.3 8.5 9.9  Hemoglobin 13.0 - 17.0 g/dL 8.0(L) 8.0(L) 8.1(L)  Hematocrit 39.0 - 52.0 % 27.2(L) 27.8(L) 28.5(L)  Platelets 150 - 400 K/uL 222 203 195    STUDIES: No results found. @IMAGES @  Assessment: 1. Anemia secondary to GI blood loss - Likely from both gastric/duodenal avm's. 2. Bacteremia - Awaiting TEE. GI clearance requested by cardiology.  3. CHF 4. CKD Stage III 5. Sensorineural deafness.   COVID-19 status: Tested Negative  Recommendations: 1. EGD with Propofol. The patient understands the nature of the planned procedure, indications, risks, alternatives and potential complications including but not limited to bleeding, infection, perforation, damage to internal organs and possible oversedation/side effects from  anesthesia. The patient agrees and gives consent to proceed.  Please refer to procedure notes for findings, recommendations and patient disposition/instructions. 2. Further recommendations to follow.   Thank you for the consult. Please call with questions or concerns.  Olean Ree, "Lanny Hurst MD [facility] [place], [place] [address] [phone]  06/21/2019 12:07 PM

## 2019-06-22 ENCOUNTER — Inpatient Hospital Stay (HOSPITAL_COMMUNITY)
Admit: 2019-06-22 | Discharge: 2019-06-22 | Disposition: A | Discharge status: Home / Self Care | Payer: PPO | Attending: Cardiology | Admitting: Cardiology

## 2019-06-22 ENCOUNTER — Encounter
Admission: EM | Disposition: A | Discharge status: Home Health | Payer: Self-pay | Source: Home / Self Care | Attending: Hospitalist

## 2019-06-22 ENCOUNTER — Inpatient Hospital Stay: Payer: Self-pay

## 2019-06-22 ENCOUNTER — Encounter: Payer: Self-pay | Admitting: *Deleted

## 2019-06-22 DIAGNOSIS — I361 Nonrheumatic tricuspid (valve) insufficiency: Secondary | ICD-10-CM

## 2019-06-22 DIAGNOSIS — I509 Heart failure, unspecified: Secondary | ICD-10-CM

## 2019-06-22 DIAGNOSIS — E785 Hyperlipidemia, unspecified: Secondary | ICD-10-CM

## 2019-06-22 DIAGNOSIS — R7881 Bacteremia: Secondary | ICD-10-CM

## 2019-06-22 DIAGNOSIS — R601 Generalized edema: Secondary | ICD-10-CM

## 2019-06-22 DIAGNOSIS — K769 Liver disease, unspecified: Secondary | ICD-10-CM

## 2019-06-22 DIAGNOSIS — Q211 Atrial septal defect: Secondary | ICD-10-CM

## 2019-06-22 DIAGNOSIS — I480 Paroxysmal atrial fibrillation: Secondary | ICD-10-CM

## 2019-06-22 DIAGNOSIS — I340 Nonrheumatic mitral (valve) insufficiency: Secondary | ICD-10-CM

## 2019-06-22 DIAGNOSIS — Z951 Presence of aortocoronary bypass graft: Secondary | ICD-10-CM

## 2019-06-22 DIAGNOSIS — C61 Malignant neoplasm of prostate: Secondary | ICD-10-CM

## 2019-06-22 DIAGNOSIS — H913 Deaf nonspeaking, not elsewhere classified: Secondary | ICD-10-CM

## 2019-06-22 DIAGNOSIS — I351 Nonrheumatic aortic (valve) insufficiency: Secondary | ICD-10-CM

## 2019-06-22 DIAGNOSIS — Z95810 Presence of automatic (implantable) cardiac defibrillator: Secondary | ICD-10-CM

## 2019-06-22 DIAGNOSIS — K0889 Other specified disorders of teeth and supporting structures: Secondary | ICD-10-CM

## 2019-06-22 DIAGNOSIS — I2510 Atherosclerotic heart disease of native coronary artery without angina pectoris: Secondary | ICD-10-CM

## 2019-06-22 DIAGNOSIS — R011 Cardiac murmur, unspecified: Secondary | ICD-10-CM

## 2019-06-22 DIAGNOSIS — R6510 Systemic inflammatory response syndrome (SIRS) of non-infectious origin without acute organ dysfunction: Secondary | ICD-10-CM

## 2019-06-22 DIAGNOSIS — B951 Streptococcus, group B, as the cause of diseases classified elsewhere: Secondary | ICD-10-CM

## 2019-06-22 DIAGNOSIS — N183 Chronic kidney disease, stage 3 unspecified: Secondary | ICD-10-CM

## 2019-06-22 DIAGNOSIS — Q2733 Arteriovenous malformation of digestive system vessel: Secondary | ICD-10-CM

## 2019-06-22 DIAGNOSIS — D500 Iron deficiency anemia secondary to blood loss (chronic): Secondary | ICD-10-CM

## 2019-06-22 DIAGNOSIS — Z87891 Personal history of nicotine dependence: Secondary | ICD-10-CM

## 2019-06-22 DIAGNOSIS — Z8719 Personal history of other diseases of the digestive system: Secondary | ICD-10-CM

## 2019-06-22 DIAGNOSIS — Z888 Allergy status to other drugs, medicaments and biological substances status: Secondary | ICD-10-CM

## 2019-06-22 HISTORY — PX: TEE WITHOUT CARDIOVERSION: SHX5443

## 2019-06-22 LAB — CBC
HCT: 26.3 % — ABNORMAL LOW (ref 39.0–52.0)
Hemoglobin: 7.6 g/dL — ABNORMAL LOW (ref 13.0–17.0)
MCH: 23.2 pg — ABNORMAL LOW (ref 26.0–34.0)
MCHC: 28.9 g/dL — ABNORMAL LOW (ref 30.0–36.0)
MCV: 80.4 fL (ref 80.0–100.0)
Platelets: 232 10*3/uL (ref 150–400)
RBC: 3.27 MIL/uL — ABNORMAL LOW (ref 4.22–5.81)
RDW: 20.2 % — ABNORMAL HIGH (ref 11.5–15.5)
WBC: 8.4 10*3/uL (ref 4.0–10.5)
nRBC: 0.2 % (ref 0.0–0.2)

## 2019-06-22 LAB — BASIC METABOLIC PANEL
Anion gap: 8 (ref 5–15)
BUN: 20 mg/dL (ref 8–23)
CO2: 26 mmol/L (ref 22–32)
Calcium: 7.7 mg/dL — ABNORMAL LOW (ref 8.9–10.3)
Chloride: 105 mmol/L (ref 98–111)
Creatinine, Ser: 0.99 mg/dL (ref 0.61–1.24)
GFR calc Af Amer: >60 mL/min (ref >60)
GFR calc non Af Amer: >60 mL/min (ref >60)
Glucose, Bld: 110 mg/dL — ABNORMAL HIGH (ref 70–99)
Potassium: 3.6 mmol/L (ref 3.5–5.1)
Sodium: 139 mmol/L (ref 135–145)

## 2019-06-22 LAB — MAGNESIUM: Magnesium: 1.9 mg/dL (ref 1.7–2.4)

## 2019-06-22 SURGERY — ECHOCARDIOGRAM, TRANSESOPHAGEAL
Anesthesia: Moderate Sedation

## 2019-06-22 MED ORDER — POTASSIUM CHLORIDE CRYS ER 20 MEQ PO TBCR
40.0000 meq | EXTENDED_RELEASE_TABLET | Freq: Two times a day (BID) | ORAL | Status: DC
  Administered 2019-06-22 – 2019-06-25 (×7): 40 meq via ORAL
  Filled 2019-06-22 (×7): qty 2

## 2019-06-22 MED ORDER — FUROSEMIDE 10 MG/ML IJ SOLN
40.0000 mg | Freq: Two times a day (BID) | INTRAMUSCULAR | Status: DC
  Administered 2019-06-22 – 2019-06-23 (×3): 40 mg via INTRAVENOUS
  Filled 2019-06-22 (×3): qty 4

## 2019-06-22 MED ORDER — MIDAZOLAM HCL 5 MG/5ML IJ SOLN
INTRAMUSCULAR | Status: AC
  Filled 2019-06-22: qty 5

## 2019-06-22 MED ORDER — FENTANYL CITRATE (PF) 100 MCG/2ML IJ SOLN
INTRAMUSCULAR | Status: AC | PRN
  Administered 2019-06-22 (×3): 25 ug via INTRAVENOUS

## 2019-06-22 MED ORDER — FENTANYL CITRATE (PF) 100 MCG/2ML IJ SOLN
INTRAMUSCULAR | Status: AC
  Filled 2019-06-22: qty 2

## 2019-06-22 MED ORDER — SODIUM CHLORIDE 0.9 % IV SOLN: INTRAVENOUS | Status: DC

## 2019-06-22 MED ORDER — LIDOCAINE VISCOUS HCL 2 % MT SOLN
OROMUCOSAL | Status: AC
  Administered 2019-06-22: 15 mL via ORAL
  Filled 2019-06-22: qty 15

## 2019-06-22 MED ORDER — BUTAMBEN-TETRACAINE-BENZOCAINE 2-2-14 % EX AERO
INHALATION_SPRAY | CUTANEOUS | Status: AC
  Administered 2019-06-22: 3
  Filled 2019-06-22: qty 5

## 2019-06-22 MED ORDER — MIDAZOLAM HCL 5 MG/5ML IJ SOLN
INTRAMUSCULAR | Status: AC | PRN
  Administered 2019-06-22 (×3): 1 mg via INTRAVENOUS

## 2019-06-22 MED ORDER — SIMVASTATIN 20 MG PO TABS
20.0000 mg | ORAL_TABLET | Freq: Every day | ORAL | Status: DC
  Administered 2019-06-22 – 2019-06-25 (×4): 20 mg via ORAL
  Filled 2019-06-22 (×4): qty 1

## 2019-06-22 MED ORDER — SODIUM CHLORIDE 0.9 % IV SOLN
2.0000 g | INTRAVENOUS | Status: DC
  Administered 2019-06-22 – 2019-06-24 (×3): 2 g via INTRAVENOUS
  Filled 2019-06-22: qty 20
  Filled 2019-06-22: qty 2
  Filled 2019-06-22 (×2): qty 20

## 2019-06-22 NOTE — Clinical Social Work Note (Signed)
Per ID pharmacist, patient will likely discharge home on IV abx. CSW notified [facility] representative. Patient was active with them for PT prior to admission.  Thomas [name], [name]

## 2019-06-22 NOTE — Progress Notes (Signed)
Progress Note  Patient Name: Thomas [name] Date of Encounter: 06/22/2019  Primary Cardiologist: Sanda Klein, MD   Subjective   Transesophageal echo this morning, tolerated procedure well with no complications No valvular vegetation noted, moderate TR, PFO noted with positive bubble study Mildly depressed ejection fraction 40%  History through interpreter Feels weak, no other complaints  Inpatient Medications    Scheduled Meds: . fentaNYL      . midazolam      . pantoprazole  40 mg Oral Daily   Continuous Infusions: . sodium chloride 10 mL/hr at 06/20/19 1609  . sodium chloride    . pencillin G potassium IV 3 Million Units (06/22/19 0818)   PRN Meds: sodium chloride, acetaminophen, albuterol   Vital Signs    Vitals:   06/22/19 0950 06/22/19 0955 06/22/19 1000 06/22/19 1010  BP: 115/61 111/64 109/68 114/65  Pulse: 60 (!) 57 (!) 51 63  Resp: 13 15 14 16   Temp:      TempSrc:      SpO2: 96% 98% 99% 99%  Weight:      Height:        Intake/Output Summary (Last 24 hours) at 06/22/2019 1019 Last data filed at 06/22/2019 0500 Gross per 24 hour  Intake 318 ml  Output 1450 ml  Net -1132 ml   Last 3 Weights 06/22/2019 06/19/2019 06/18/2019  Weight (lbs) 228 lb 9.9 oz 228 lb 6.3 oz 228 lb 6.3 oz  Weight (kg) 103.7 kg 103.6 kg 103.6 kg      Telemetry    Paced rhythm- Personally Reviewed  ECG     - Personally Reviewed  Physical Exam   GEN: No acute distress.  Weak, pale Neck: No JVD Cardiac: RRR, 2-3/6 SEM LSB, rubs, or gallops.  Respiratory: Clear to auscultation bilaterally. GI: Soft, nontender, non-distended  MS: 2+ LE edema; No deformity. Neuro:  Nonfocal  Psych: Normal affect   Labs    High Sensitivity Troponin:  No results for input(s): TROPONINIHS in the last 720 hours.    Chemistry Recent Labs  Lab 06/16/19 2052 06/16/19 2052 06/17/19 0321 06/18/19 0444 06/20/19 0544 06/21/19 0610 06/22/19 0436  NA 140   < > 139   < > 142 138 139  K 3.3*    < > 3.3*   < > 3.3* 3.6 3.6  CL 102   < > 104   < > 106 105 105  CO2 26   < > 26   < > 28 27 26   GLUCOSE 118*   < > 127*   < > 98 110* 110*  BUN 32*   < > 36*   < > 31* 26* 20  CREATININE 1.76*   < > 1.57*   < > 1.14 1.10 0.99  CALCIUM 8.1*   < > 7.4*   < > 7.7* 7.8* 7.7*  PROT 5.2*  --  4.4*  --   --   --   --   ALBUMIN 2.1*  --  1.7*  --   --   --   --   AST 51*  --  44*  --   --   --   --   ALT 17  --  15  --   --   --   --   ALKPHOS 100  --  80  --   --   --   --   BILITOT 1.1  --  1.1  --   --   --   --  GFRNONAA 35*   < > 40*   < > 60* >60 >60  GFRAA 41*   < > 47*   < > >60 >60 >60  ANIONGAP 12   < > 9   < > 8 6 8    < > = values in this interval not displayed.     Hematology Recent Labs  Lab 06/20/19 0544 06/21/19 0610 06/22/19 0436  WBC 8.5 9.3 8.4  RBC 3.46* 3.42* 3.27*  HGB 8.0* 8.0* 7.6*  HCT 27.8* 27.2* 26.3*  MCV 80.3 79.5* 80.4  MCH 23.1* 23.4* 23.2*  MCHC 28.8* 29.4* 28.9*  RDW 19.9* 19.9* 20.2*  PLT 203 222 232    BNP Recent Labs  Lab 06/16/19 2052  BNP 1,390.0*     DDimer No results for input(s): DDIMER in the last 168 hours.   Radiology    No results found.  Cardiac Studies   Echocardiogram ejection fraction 40%, severely dilated left atrium, dilated pulmonary artery, right heart pressures 60 mmHg  Patient Profile     85 y.o. male 85 year old gentleman with history of CAD status post CABG, PCI, heart failure reduced ejection fraction last EF 35 to 40%, status post ICD 2014, permanent A. fib not on anticoagulation due to GI bleed, deafness who is being seen for group B strep bacteremia  Assessment & Plan    1.  Group B strep bacteremia 2/4 blood cultures positive for group B streptococcus. Fever on arrival to the hospital Transesophageal echo, close evaluation of pacer wires, No vegetation noted We will defer antibiotic treatment to primary medicine team and infectious disease = We will need close monitoring as outpatient for  persistent fevers, repeat blood cultures  2. HFrEF EF 35-40% Appears torsemide and metoprolol held Notes indicating intolerance to Entresto and ramipril. It appears beta-blockers may have been held for hypotension and bradycardia -----Needs back on diuretic given elevated right heart pressures and significant TR Significant leg edema had 1 dose of Lasix 2 days ago 1 dose of torsemide 4 days ago Notes indicating he is +1.4 L since arrival ----Given markedly dilated RV, right atrium, pulmonary artery, elevated right heart pressures, leg edema , would recommend Lasix 40 IV twice daily for several days with eventual transition back to torsemide 40 daily which he takes as outpatient  3.  History of CAD status post CABG, PCI -Currently with no chest pain On statin,   4.  Permanent A. Fib -Heart rate controlled.  Currently paced rhythm. -Currently has microcytic anemia. -Not on anticoagulation due to history of GI bleeds, known AVM. Spontaneous contrast noted in the left atrium, unable to exclude small left atrial thrombus (not well-visualized)  5.  GI bleed Black stools for 1 month on arrival to the hospital Dysphagia EGD last year showing hiatal hernia Previous diagnosis small bowel AVMs, causing GI bleeds CT scan possible cirrhosis   Total encounter time more than 45 minutes  Greater than 50% was spent in counseling and coordination of care with the patient  For questions or updates, please contact [facility] Please consult [email or web address] for contact info under        Signed, Ida Rogue, MD  06/22/2019, 10:19 AM

## 2019-06-22 NOTE — Evaluation (Addendum)
Physical Therapy Evaluation Patient Details Name: Thomas [name] MRN: [id number] DOB: 03-19-1934 Today's Date: 06/22/2019   History of Present Illness  presented to ER secondary to chills, vomiting, diarrhea, AMS and decreased responsiveness; admitted for management of SIRS/sepsis related to group B strep bacteremia.  S/P TEE (06/22/19) with no noted vegetations, mod TR, PFO and EF approx 40%  Clinical Impression  Upon evaluation, patient alert and oriented; follows commands and agreeable to participation with session as able.  Bilat UE/LE strength and ROM grossly WFL for basic transfers and mobility, though generally weakened throughout.  Currently requiring min assist for bed mobility; min assist for sit/stand, basic transfers and gait (5') with RW.  Demonstrates decreased step height/length, mod reliance on RW; slightly unsteady and 'weak' post procedure this AM.  Patient declined additional activity/distance as result.  Did note desat 85% on RA with bed/chair transfer; 2L O2 via [place] reapplied for recovery >90%.  RN informed/aware. Of note, increased edema noted throughout L UE, mid-humerus distally.  RN/MD informed/aware. Would benefit from skilled PT to address above deficits and promote optimal return to PLOF.; Recommend transition to HHPT upon discharge from acute hospitalization.  CAP ASL interpreter, Lenox Ahr present to assist with communication throughout session.    Follow Up Recommendations Home health PT    Equipment Recommendations  Rolling walker with 5" wheels    Recommendations for Other Services       Precautions / Restrictions Precautions Precautions: Fall Restrictions Weight Bearing Restrictions: No      Mobility  Bed Mobility Overal bed mobility: Needs Assistance Bed Mobility: Supine to Sit     Supine to sit: Min assist     General bed mobility comments: assist for truncal elevation  Transfers Overall transfer level: Needs assistance Equipment  used: Rolling walker (2 wheeled) Transfers: Sit to/from Stand Sit to Stand: Min assist         General transfer comment: assist for lift off, cuing for hand placement  Ambulation/Gait Ambulation/Gait assistance: Min assist Gait Distance (Feet): 5 Feet Assistive device: Rolling walker (2 wheeled)       General Gait Details: decreased step height/length, mod reliance on RW; slightly unsteady and 'weak' post procedure this AM.  Patient declined additional activity/distance as result.  Did note desat 85% on RA with bed/chair transfer; 2L O2 via [place] reapplied for recovery >90%  Stairs            Wheelchair Mobility    Modified Rankin (Stroke Patients Only)       Balance Overall balance assessment: History of Falls;Needs assistance Sitting-balance support: No upper extremity supported;Feet supported Sitting balance-Leahy Scale: Good     Standing balance support: Bilateral upper extremity supported Standing balance-Leahy Scale: Fair                               Pertinent Vitals/Pain Pain Assessment: No/denies pain    Home Living Family/patient expects to be discharged to:: Private residence Living Arrangements: Spouse/significant other Available Help at Discharge: Family;Available 24 hours/day Type of Home: House Home Access: Level entry     Home Layout: One level Home Equipment: Walker - 2 wheels;Cane - single point Additional Comments: Lives with wife and adult son     Prior Function Level of Independence: Independent with assistive device(s)         Comments: Was ambulatory with SPC household distances. Reports history of multiple falls recently due to weakness. Home O2 as  needed.     Hand Dominance        Extremity/Trunk Assessment   Upper Extremity Assessment Upper Extremity Assessment: Generalized weakness(grossly 4/5 throughout)    Lower Extremity Assessment Lower Extremity Assessment: Generalized weakness(grossly 4/5  throughout; significant pitting edema bilat LEs; baseline neuropathy knees distally)       Communication   Communication: Interpreter utilized(CAP In-person interpreter, Lenox Ahr)  Cognition Arousal/Alertness: Awake/alert Behavior During Therapy: WFL for tasks assessed/performed Overall Cognitive Status: Within Functional Limits for tasks assessed                                        General Comments      Exercises Other Exercises Other Exercises: Educated in positioning and LE therex for edema management in bilat LEs; patient voiced understanding, but question full comprehension/integration of information.  Will continue to reinforce as needed.   Assessment/Plan    PT Assessment Patient needs continued PT services  PT Problem List Decreased strength;Decreased activity tolerance;Decreased balance;Decreased mobility;Decreased cognition;Pain;Cardiopulmonary status limiting activity       PT Treatment Interventions Gait training;DME instruction;Therapeutic exercise;Balance training;Functional mobility training;Therapeutic activities;Patient/family education    PT Goals (Current goals can be found in the Care Plan section)  Acute Rehab PT Goals Patient Stated Goal: to go home PT Goal Formulation: With patient Time For Goal Achievement: 07/06/19 Potential to Achieve Goals: Good    Frequency Min 2X/week   Barriers to discharge        Co-evaluation               AM-PAC PT "6 Clicks" Mobility  Outcome Measure Help needed turning from your back to your side while in a flat bed without using bedrails?: None Help needed moving from lying on your back to sitting on the side of a flat bed without using bedrails?: A Little Help needed moving to and from a bed to a chair (including a wheelchair)?: A Little Help needed standing up from a chair using your arms (e.g., wheelchair or bedside chair)?: A Little Help needed to walk in hospital room?: A  Little Help needed climbing 3-5 steps with a railing? : A Little 6 Click Score: 19    End of Session Equipment Utilized During Treatment: Gait belt Activity Tolerance: Patient limited by fatigue Patient left: in chair;with call bell/phone within reach;with chair alarm set Nurse Communication: Mobility status PT Visit Diagnosis: Muscle weakness (generalized) (M62.81);Repeated falls (R29.6);Difficulty in walking, not elsewhere classified (R26.2)    Time: [id number] PT Time Calculation (min) (ACUTE ONLY): 20 min   Charges:   PT Evaluation $PT Eval Moderate Complexity: 1 Mod PT Treatments $Therapeutic Activity: 8-22 mins        Venetta Knee H. Owens Shark, PT, DPT, NCS 06/22/19, 11:56 AM [phone]

## 2019-06-22 NOTE — Progress Notes (Signed)
*  PRELIMINARY RESULTS* Echocardiogram Echocardiogram Transesophageal has been performed.  Thomas [name] 06/22/2019, 10:10 AM

## 2019-06-22 NOTE — Progress Notes (Signed)
PT Cancellation Note  Patient Details Name: Thomas [name] MRN: [id number] DOB: 05/24/1934   Cancelled Treatment:    Reason Eval/Treat Not Completed: Patient at procedure or test/unavailable(Evaluation planned with ASL interpreter for 0900. Upon arrival to unit, patient preparing to leave unit for TEE; interpreter assisting with translation for procedure.  Will plan to reschedule interpreter and PT evaluation for next date as in-person interpreter available (PM per discussion with interpreter).)   Larz Mark H. Owens Shark, PT, DPT, NCS 06/22/19, 8:51 AM [phone]

## 2019-06-22 NOTE — Telephone Encounter (Signed)
The patient is presently in the hospital.  I had called to discuss his question about CoVid vaccine. 4/8

## 2019-06-22 NOTE — Progress Notes (Signed)
PROGRESS NOTE    Thomas [name]  [id number] DOB: 1935-03-04 DOA: 06/16/2019 PCP: [name], MD      Brief Narrative:  Thomas [name] is a 85 y.o. M with deafness, sCHF EF 35-40%, chronic anasarca, CAD, BiV ICD/pacemaker, HTN, chronic A. fib, possible cirrhosis, and history of GI bleed due to AVMs who presented with chills, generalized malaise, and vomiting for several days.  Heart, chest x-ray unremarkable, antibiotics were started empirically.  Culture subsequently turned positive for group B strep.       Assessment & Plan:  Sepsis secondary to Group B strep bacteremia Patient presented with leukocytosis, fever, AKI, elevated lactic acid.  Blood cultures growing group B strep.  TEE today unremarkable -Continue penicillin -Consult infectious disease -Plan for PICC line tomorrow  Acute on chronic systolic CHF Diuretics had mostly been held, use very cautiously until now.  On echocardiogram today, noted dilated right ventricle, elevated right heart pressures, suggesting significant fluid overload -Resume IV Lasix twice daily -Continue potassium  Hypertension Coronary disease -Resume simvastatin -Hold home Imdur  Chronic atrial fibrillation Not on rate control agents, heart rate controlled -Hold anticoagulation due to severe anemia  Possible cirrhosis  Chronic anemia, normocytic Hemoglobin stable in the sevens today -Hold home iron  Pressure injury, stage II, buttocks, present on arrival   Severe protein calorie malnutrition As evidenced by severe depletion of subcutaneous muscle mass, chronic illness, poor oral intake, and pressure injury.         Disposition: The patient was admitted with streptococcal bacteremia and sepsis.  He will need ID consultation to determine duration of therapy.  Afterwards he will need PICC line placement for extended IV antibiotics.  In addition, his echocardiogram today showed significant fluid overload, requiring likely  several days of IV Lasix.          MDM: The below labs and imaging reports were reviewed and summarized above.  Medication management as above.  This is a severe exacerbation of his chronic illness    DVT prophylaxis: SCDs Code Status: Full code Family Communication:     Consultants:   Cardiology  Infectious disease  Procedures:   4/5 TTE  4/8 TEE  Antimicrobials:   Vancomycin and Zosyn 4/2-4/4  Penicillin 4/4>>  Culture data:   4/2 blood culture x2-group B strep  4/2 Urine culture no growth  4/4 blood culture x2 -- no growth          Subjective: Patient has no chest discomfort, abdominal pain.  He has left upper extremity swelling, left leg weakness.  No vomiting, diarrhea.  Objective: Vitals:   06/22/19 1043 06/22/19 1111 06/22/19 1204 06/22/19 1219  BP: 122/64 133/70 121/71   Pulse: (!) 54 60 64 61  Resp: 20 20  (!) 24  Temp:  97.9 F (36.6 C)  (!) 97.4 F (36.3 C)  TempSrc:  Oral    SpO2: 90% 91% 95% 96%  Weight:      Height:        Intake/Output Summary (Last 24 hours) at 06/22/2019 1307 Last data filed at 06/22/2019 0500 Gross per 24 hour  Intake 318 ml  Output 600 ml  Net -282 ml   Filed Weights   06/18/19 0540 06/19/19 0437 06/22/19 0255  Weight: 103.6 kg 103.6 kg 103.7 kg    Examination: General appearance: Thin, elderly, debilitated adult male, alert and in no acute distress.  Sitting up in recliner. HEENT: Anicteric, conjunctiva pink, lids and lashes normal. No nasal deformity, discharge, epistaxis.  Lips moist, oropharynx moist, no oral lesions, deaf.   Skin: Warm and dry.  Numerous senile purpura, no jaundice.  No suspicious rashes or lesions. Cardiac: RRR, nl [id number], systolic murmur noted.  Capillary refill is brisk.  JVP not visible.  Marked lower extremity edema.  Radial pulses 2+ and symmetric.  Left arm swelling noted, at the elbow Respiratory: Normal respiratory rate and rhythm.  CTAB without rales or wheezes.   Diminished bilateral bases. Abdomen: Abdomen soft.  No TTP or guarding. No ascites, distension, hepatosplenomegaly.   MSK: No deformities or effusions.  Diffuse loss of subcutaneous muscle mass intact, left arm swelling. Neuro: Awake and alert.  EOMI, moves all extremities. Speech fluent.    Psych: Sensorium intact and responding to questions, attention normal. Affect .  Judgment and insight appear normal.    Data Reviewed: I have personally reviewed following labs and imaging studies:  CBC: Recent Labs  Lab 06/16/19 2052 06/17/19 0321 06/18/19 0444 06/19/19 0534 06/20/19 0544 06/21/19 0610 06/22/19 0436  WBC 17.9*   < > 13.6* 9.9 8.5 9.3 8.4  NEUTROABS 16.1*  --   --   --   --   --   --   HGB 8.5*   < > 7.6* 8.1* 8.0* 8.0* 7.6*  HCT 27.7*   < > 26.1* 28.5* 27.8* 27.2* 26.3*  MCV 78.0*   < > 81.6 81.0 80.3 79.5* 80.4  PLT 206   < > 162 195 203 222 232   < > = values in this interval not displayed.   Basic Metabolic Panel: Recent Labs  Lab 06/18/19 0444 06/19/19 0534 06/20/19 0544 06/21/19 0610 06/22/19 0436  NA 138 140 142 138 139  K 3.3* 3.7 3.3* 3.6 3.6  CL 105 107 106 105 105  CO2 26 29 28 27 26   GLUCOSE 126* 114* 98 110* 110*  BUN 43* 39* 31* 26* 20  CREATININE 1.57* 1.33* 1.14 1.10 0.99  CALCIUM 7.4* 7.8* 7.7* 7.8* 7.7*  MG 2.0 2.0 1.9 2.1 1.9   GFR: Estimated Creatinine Clearance: 73.9 mL/min (by C-G formula based on SCr of 0.99 mg/dL). Liver Function Tests: Recent Labs  Lab 06/16/19 2052 06/17/19 0321  AST 51* 44*  ALT 17 15  ALKPHOS 100 80  BILITOT 1.1 1.1  PROT 5.2* 4.4*  ALBUMIN 2.1* 1.7*   Recent Labs  Lab 06/16/19 2052  LIPASE 17   No results for input(s): AMMONIA in the last 168 hours. Coagulation Profile: Recent Labs  Lab 06/16/19 2052  INR 1.4*   Cardiac Enzymes: No results for input(s): CKTOTAL, CKMB, CKMBINDEX, TROPONINI in the last 168 hours. BNP (last 3 results) No results for input(s): PROBNP in the last 8760  hours. HbA1C: No results for input(s): HGBA1C in the last 72 hours. CBG: No results for input(s): GLUCAP in the last 168 hours. Lipid Profile: No results for input(s): CHOL, HDL, LDLCALC, TRIG, CHOLHDL, LDLDIRECT in the last 72 hours. Thyroid Function Tests: No results for input(s): TSH, T4TOTAL, FREET4, T3FREE, THYROIDAB in the last 72 hours. Anemia Panel: No results for input(s): VITAMINB12, FOLATE, FERRITIN, TIBC, IRON, RETICCTPCT in the last 72 hours. Urine analysis:    Component Value Date/Time   COLORURINE YELLOW (A) 06/16/2019 2304   APPEARANCEUR HAZY (A) 06/16/2019 2304   APPEARANCEUR Clear 12/13/2017 0918   LABSPEC 1.014 06/16/2019 2304   LABSPEC 1.021 11/19/2011 1501   PHURINE 5.0 06/16/2019 2304   GLUCOSEU NEGATIVE 06/16/2019 2304   GLUCOSEU Negative 11/19/2011 1501   HGBUR NEGATIVE  06/16/2019 [place] 06/16/2019 2304   BILIRUBINUR Negative 12/13/2017 0918   BILIRUBINUR Negative 11/19/2011 1501   KETONESUR NEGATIVE 06/16/2019 2304   PROTEINUR 30 (A) 06/16/2019 2304   NITRITE NEGATIVE 06/16/2019 2304   LEUKOCYTESUR NEGATIVE 06/16/2019 2304   LEUKOCYTESUR Negative 11/19/2011 1501   Sepsis Labs: @LABRCNTIP (procalcitonin:4,lacticacidven:4)  ) Recent Results (from the past 240 hour(s))  C Difficile Quick Screen w PCR reflex     Status: None   Collection Time: 06/16/19  2:06 AM   Specimen: STOOL  Result Value Ref Range Status   C Diff antigen NEGATIVE NEGATIVE Final   C Diff toxin NEGATIVE NEGATIVE Final   C Diff interpretation No C. difficile detected.  Final    Comment: Performed at [facility], [address] [place], [place] [address]  Gastrointestinal Panel by PCR , Stool     Status: None   Collection Time: 06/16/19  2:06 AM  Result Value Ref Range Status   Campylobacter species NOT DETECTED NOT DETECTED Final   Plesimonas shigelloides NOT DETECTED NOT DETECTED Final   Salmonella species NOT DETECTED NOT DETECTED Final    Yersinia enterocolitica NOT DETECTED NOT DETECTED Final   Vibrio species NOT DETECTED NOT DETECTED Final   Vibrio cholerae NOT DETECTED NOT DETECTED Final   Enteroaggregative E coli (EAEC) NOT DETECTED NOT DETECTED Final   Enteropathogenic E coli (EPEC) NOT DETECTED NOT DETECTED Final   Enterotoxigenic E coli (ETEC) NOT DETECTED NOT DETECTED Final   Shiga like toxin producing E coli (STEC) NOT DETECTED NOT DETECTED Final   Shigella/Enteroinvasive E coli (EIEC) NOT DETECTED NOT DETECTED Final   Cryptosporidium NOT DETECTED NOT DETECTED Final   Cyclospora cayetanensis NOT DETECTED NOT DETECTED Final   Entamoeba histolytica NOT DETECTED NOT DETECTED Final   Giardia lamblia NOT DETECTED NOT DETECTED Final   Adenovirus F40/41 NOT DETECTED NOT DETECTED Final   Astrovirus NOT DETECTED NOT DETECTED Final   Norovirus GI/GII NOT DETECTED NOT DETECTED Final   Rotavirus A NOT DETECTED NOT DETECTED Final   Sapovirus (I, II, IV, and V) NOT DETECTED NOT DETECTED Final    Comment: Performed at [facility], [address] [place], [place] [address]  Blood Culture (routine x 2)     Status: Abnormal   Collection Time: 06/16/19  8:52 PM   Specimen: BLOOD  Result Value Ref Range Status   Specimen Description   Final    BLOOD LEFT FA Performed at [facility], [address] [place], [place] [address]    Special Requests   Final    BOTTLES DRAWN AEROBIC AND ANAEROBIC Blood Culture adequate volume Performed at [facility], [address] [place], [place] [address]    Culture  Setup Time   Final    IN BOTH AEROBIC AND ANAEROBIC BOTTLES GRAM POSITIVE COCCI IN PAIRS CRITICAL RESULT CALLED TO, READ BACK BY AND VERIFIED WITH: SCOTT HALL ON 06/17/19 AT 0420 [facility]    Culture (A)  Final    GROUP B STREP(S.AGALACTIAE)ISOLATED SUSCEPTIBILITIES PERFORMED ON PREVIOUS CULTURE WITHIN THE LAST 5 DAYS. Performed at [facility], [place] [address] [place], [place] [address]     Report Status 06/19/2019 FINAL  Final  Respiratory Panel by RT PCR (Flu A&B, Covid) - Nasopharyngeal Swab     Status: None   Collection Time: 06/16/19 10:02 PM   Specimen: Nasopharyngeal Swab  Result Value Ref Range Status   SARS Coronavirus 2 by RT PCR NEGATIVE NEGATIVE Final  Comment: (NOTE) SARS-CoV-2 target nucleic acids are NOT DETECTED. The SARS-CoV-2 RNA is generally detectable in upper respiratoy specimens during the acute phase of infection. The lowest concentration of SARS-CoV-2 viral copies this assay can detect is 131 copies/mL. A negative result does not preclude SARS-Cov-2 infection and should not be used as the sole basis for treatment or other patient management decisions. A negative result may occur with  improper specimen collection/handling, submission of specimen other than nasopharyngeal swab, presence of viral mutation(s) within the areas targeted by this assay, and inadequate number of viral copies (<131 copies/mL). A negative result must be combined with clinical observations, patient history, and epidemiological information. The expected result is Negative. Fact Sheet for Patients:  PinkCheek.be Fact Sheet for Healthcare Providers:  GravelBags.it This test is not yet ap proved or cleared by the [place] FDA and  has been authorized for detection and/or diagnosis of SARS-CoV-2 by FDA under an Emergency Use Authorization (EUA). This EUA will remain  in effect (meaning this test can be used) for the duration of the COVID-19 declaration under Section 564(b)(1) of the Act, 21 U.S.C. section 360bbb-3(b)(1), unless the authorization is terminated or revoked sooner.    Influenza A by PCR NEGATIVE NEGATIVE Final   Influenza B by PCR NEGATIVE NEGATIVE Final    Comment: (NOTE) The Xpert Xpress SARS-CoV-2/FLU/RSV assay is intended as an aid in  the diagnosis of influenza from Nasopharyngeal swab specimens  and  should not be used as a sole basis for treatment. Nasal washings and  aspirates are unacceptable for Xpert Xpress SARS-CoV-2/FLU/RSV  testing. Fact Sheet for Patients: PinkCheek.be Fact Sheet for Healthcare Providers: GravelBags.it This test is not yet approved or cleared by the [place] FDA and  has been authorized for detection and/or diagnosis of SARS-CoV-2 by  FDA under an Emergency Use Authorization (EUA). This EUA will remain  in effect (meaning this test can be used) for the duration of the  Covid-19 declaration under Section 564(b)(1) of the Act, 21  U.S.C. section 360bbb-3(b)(1), unless the authorization is  terminated or revoked. Performed at [facility], [address] [place], [place] [address]   Blood Culture (routine x 2)     Status: Abnormal   Collection Time: 06/16/19 10:03 PM   Specimen: BLOOD  Result Value Ref Range Status   Specimen Description   Final    BLOOD LEFT FA Performed at [facility], [address], [place], [place] [address]    Special Requests   Final    BOTTLES DRAWN AEROBIC AND ANAEROBIC Blood Culture adequate volume Performed at [facility], [address] [place], [place] [address]    Culture  Setup Time   Final    Organism ID to follow IN BOTH AEROBIC AND ANAEROBIC BOTTLES GRAM POSITIVE COCCI IN PAIRS CRITICAL VALUE NOTED.  VALUE IS CONSISTENT WITH PREVIOUSLY REPORTED AND CALLED VALUE. Performed at [facility], [place] [address] [place], [place] [address]    Culture GROUP B STREP(S.AGALACTIAE)ISOLATED (A)  Final   Report Status 06/19/2019 FINAL  Final   Organism ID, Bacteria GROUP B STREP(S.AGALACTIAE)ISOLATED  Final      Susceptibility   Group b strep(s.agalactiae)isolated - MIC*    CLINDAMYCIN >=1 RESISTANT Resistant     AMPICILLIN <=0.25 SENSITIVE Sensitive     ERYTHROMYCIN >=8 RESISTANT Resistant     VANCOMYCIN 0.5 SENSITIVE  Sensitive     CEFTRIAXONE <=0.12 SENSITIVE Sensitive     LEVOFLOXACIN 1 SENSITIVE Sensitive     *  GROUP B STREP(S.AGALACTIAE)ISOLATED  Blood Culture ID Panel (Reflexed)     Status: Abnormal   Collection Time: 06/16/19 10:03 PM  Result Value Ref Range Status   Enterococcus species NOT DETECTED NOT DETECTED Final   Listeria monocytogenes NOT DETECTED NOT DETECTED Final   Staphylococcus species NOT DETECTED NOT DETECTED Final   Staphylococcus aureus (BCID) NOT DETECTED NOT DETECTED Final   Streptococcus species DETECTED (A) NOT DETECTED Final    Comment: CRITICAL RESULT CALLED TO, READ BACK BY AND VERIFIED WITH: SCOTT HALL ON 06/17/19 AT 0608 [facility]    Streptococcus agalactiae DETECTED (A) NOT DETECTED Final    Comment: CRITICAL RESULT CALLED TO, READ BACK BY AND VERIFIED WITH: SCOTT HALL ON 06/17/19 AT 0608 [facility]    Streptococcus pneumoniae NOT DETECTED NOT DETECTED Final   Streptococcus pyogenes NOT DETECTED NOT DETECTED Final   Acinetobacter baumannii NOT DETECTED NOT DETECTED Final   Enterobacteriaceae species NOT DETECTED NOT DETECTED Final   Enterobacter cloacae complex NOT DETECTED NOT DETECTED Final   Escherichia coli NOT DETECTED NOT DETECTED Final   Klebsiella oxytoca NOT DETECTED NOT DETECTED Final   Klebsiella pneumoniae NOT DETECTED NOT DETECTED Final   Proteus species NOT DETECTED NOT DETECTED Final   Serratia marcescens NOT DETECTED NOT DETECTED Final   Haemophilus influenzae NOT DETECTED NOT DETECTED Final   Neisseria meningitidis NOT DETECTED NOT DETECTED Final   Pseudomonas aeruginosa NOT DETECTED NOT DETECTED Final   Candida albicans NOT DETECTED NOT DETECTED Final   Candida glabrata NOT DETECTED NOT DETECTED Final   Candida krusei NOT DETECTED NOT DETECTED Final   Candida parapsilosis NOT DETECTED NOT DETECTED Final   Candida tropicalis NOT DETECTED NOT DETECTED Final    Comment: Performed at [facility], [address], [place], [place] [address]  Urine  culture     Status: None   Collection Time: 06/16/19 11:04 PM   Specimen: In/Out Cath Urine  Result Value Ref Range Status   Specimen Description   Final    IN/OUT CATH URINE Performed at [facility], [address], [place], [place] [address]    Special Requests   Final    NONE Performed at [facility], [address], [place], [place] [address]    Culture   Final    NO GROWTH Performed at [facility], [place] [address] [place], [place] [address]    Report Status 06/18/2019 FINAL  Final  Culture, blood (Routine X 2) w Reflex to ID Panel     Status: None (Preliminary result)   Collection Time: 06/18/19  5:07 PM   Specimen: BLOOD  Result Value Ref Range Status   Specimen Description BLOOD BLOOD LEFT HAND  Final   Special Requests   Final    BOTTLES DRAWN AEROBIC AND ANAEROBIC Blood Culture adequate volume   Culture   Final    NO GROWTH 4 DAYS Performed at [facility], [address], [place],  [address]    Report Status PENDING  Incomplete  Culture, blood (Routine X 2) w Reflex to ID Panel     Status: None (Preliminary result)   Collection Time: 06/18/19  5:07 PM   Specimen: BLOOD  Result Value Ref Range Status   Specimen Description BLOOD BLOOD RIGHT HAND  Final   Special Requests AEROBIC BOTTLE ONLY Blood Culture adequate volume  Final   Culture   Final    NO GROWTH 4 DAYS Performed at [facility], [place],  [place] [address]    Report Status PENDING  Incomplete         Radiology Studies: No results found.      Scheduled Meds: . furosemide  40 mg Intravenous BID  . pantoprazole  40 mg Oral Daily  . potassium chloride  40 mEq Oral BID   Continuous Infusions: . sodium chloride 10 mL/hr at 06/20/19 1609  . pencillin G potassium IV 3 Million Units (06/22/19 1216)     LOS: 6 days    Time spent: 35 minutes    Edwin Dada, MD [facility] 06/22/2019, 1:07 PM      Please page though [place] or Epic secure chat:  For [employer], Adult nurse

## 2019-06-22 NOTE — Consult Note (Addendum)
NAME: Thomas [name]  DOB: 23-Aug-1934  MRN: [id number]  Date/Time: 06/22/2019 8:02 PM  REQUESTING PROVIDER: Loleta Books Subjective:  REASON FOR CONSULT: BActeremia Chart reviewed and wrote my questions to patient as he is deaf and dumb Thomas [name] is a 85 y.o. male with a history of CHF, CAD, CABG,  AICD, GI bleed, Paroxysmal afib, prostate ca presented to the ED on 06/16/19 with fever , shakes and altered mental status . He also had vomiting and diarrhea.  Was recently in the hospital between 3/5- 3/16 for CHF . Has GI bleed due to AVM Blood culture came positive for Group B streptococcus and I am seeing him for the same- He had TEE today which is neg for endocarditis , he has PFO He c/o swelling all over Past Medical History:  Diagnosis Date  . AICD (automatic cardioverter/defibrillator) present   . Biventricular ICD (implantable cardioverter-defibrillator) in place 03/24/2005   Implantation of a Medtronic Adapta ADDRO1, serial number [id number]  . CHF (congestive heart failure) ([place])   . CKD (chronic kidney disease), stage III   . Coronary artery disease    a. s/p CABG 1986. b. Multiple PCIs/caths. c. 09/2013: s/p PTCA and BMS to SVG-OM.  [name]   . Dysrhythmia   . History of abdominal aortic aneurysm   . History of bleeding peptic ulcer 1980  . History of epididymitis 2013  . HTN (hypertension)   . Hydronephrosis with ureteropelvic junction obstruction   . Hydroureter on left 2009  . Hypertension   . Ischemic cardiomyopathy    a. Prior EF 30-35%, s/p BIV-ICD. b. 09/2013: EF 45-50%.  . Moderate tricuspid regurgitation   . PAF (paroxysmal atrial fibrillation) ([place])   . Presence of permanent cardiac pacemaker   . Prostate cancer ([name])   . Status post coronary artery bypass grafting 1986   LIMA to the LAD, SVG to OM, SVG to RCA  . Testicular swelling     Past Surgical History:  Procedure Laterality Date  . 2-D echocardiogram  11/20/2011   Ejection fraction 30-35% moderate  concentric left ventricular hypertrophy. Left atrium is moderately dilated. Mild MR. Mild or  . BI-VENTRICULAR IMPLANTABLE CARDIOVERTER DEFIBRILLATOR N/A 12/16/2012   Procedure: BI-VENTRICULAR IMPLANTABLE CARDIOVERTER DEFIBRILLATOR  (CRT-D);  Surgeon: [name], MD;  Location: [facility] CATH LAB;  Service: Cardiovascular;  Laterality: N/A;  . CARDIAC CATHETERIZATION  12/10/2011   SVG to OM widely patent.  LIMA to LAD patent  . CATARACT EXTRACTION W/PHACO Right 10/12/2017   Procedure: CATARACT EXTRACTION PHACO AND INTRAOCULAR LENS PLACEMENT (IOC);  Surgeon: Birder Robson, MD;  Location: [facility] ORS;  Service: Ophthalmology;  Laterality: Right;  [place] 00:57 AP% 15.9 CDE 9.07 Fluid pack lot # [id number]  . COLONOSCOPY N/A 07/13/2018   Procedure: COLONOSCOPY;  Surgeon: Toledo, Benay Pike, MD;  Location: [facility] ENDOSCOPY;  Service: Gastroenterology;  Laterality: N/A;  . CORONARY ARTERY BYPASS GRAFT  1986  . ENTEROSCOPY N/A 09/14/2018   Procedure: ENTEROSCOPY;  Surgeon: Toledo, Benay Pike, MD;  Location: [facility] ENDOSCOPY;  Service: Gastroenterology;  Laterality: N/A;  symptomatic anemia, GI blood loss anemia, melena, positive small bowel capsule endoscopy showing source of bleeding   . ESOPHAGOGASTRODUODENOSCOPY N/A 07/13/2018   Procedure: ESOPHAGOGASTRODUODENOSCOPY (EGD);  Surgeon: Toledo, Benay Pike, MD;  Location: [facility] ENDOSCOPY;  Service: Gastroenterology;  Laterality: N/A;  . ESOPHAGOGASTRODUODENOSCOPY N/A 09/14/2018   Procedure: ESOPHAGOGASTRODUODENOSCOPY (EGD);  Surgeon: Toledo, Benay Pike, MD;  Location: [facility] ENDOSCOPY;  Service: Gastroenterology;  Laterality: N/A;  SIGN LANAGUAGE INTERPRETER  . ESOPHAGOGASTRODUODENOSCOPY  N/A 06/21/2019   Procedure: ESOPHAGOGASTRODUODENOSCOPY (EGD);  Surgeon: Toledo, Benay Pike, MD;  Location: [facility] ENDOSCOPY;  Service: Gastroenterology;  Laterality: N/A;  . ESOPHAGOGASTRODUODENOSCOPY (EGD) WITH PROPOFOL N/A 05/27/2018   Procedure: ESOPHAGOGASTRODUODENOSCOPY (EGD) WITH PROPOFOL;   Surgeon: Clarene Essex, MD;  Location: [place];  Service: Endoscopy;  Laterality: N/A;  . INSERT / REPLACE / REMOVE PACEMAKER    . LEFT HEART CATHETERIZATION WITH CORONARY/GRAFT ANGIOGRAM N/A 12/10/2011   Procedure: LEFT HEART CATHETERIZATION WITH Beatrix Fetters;  Surgeon: Sanda Klein, MD;  Location: [facility] CATH LAB;  Service: Cardiovascular;  Laterality: N/A;  . LEFT HEART CATHETERIZATION WITH CORONARY/GRAFT ANGIOGRAM N/A 09/25/2013   Procedure: LEFT HEART CATHETERIZATION WITH Beatrix Fetters;  Surgeon: Blane Ohara, MD;  Location: [facility] CATH LAB;  Service: Cardiovascular;  Laterality: N/A;  . Persantine Myoview  05/06/2010   Post-rest ejection fraction 30%. No significant ischemia demonstrated. Compared to previous study there is no significant change.  . TEE WITHOUT CARDIOVERSION N/A 06/22/2019   Procedure: TRANSESOPHAGEAL ECHOCARDIOGRAM (TEE);  Surgeon: Minna Merritts, MD;  Location: [facility] ORS;  Service: Cardiovascular;  Laterality: N/A;  . TRANSURETHRAL RESECTION OF PROSTATE     s/p    Social History   Socioeconomic History  . Marital status: Married    Spouse name: Not on file  . Number of children: Not on file  . Years of education: Not on file  . Highest education level: Not on file  Occupational History  . Not on file  Tobacco Use  . Smoking status: Former Smoker    Quit date: 03/15/1985    Years since quitting: 34.2  . Smokeless tobacco: Never Used  Substance and Sexual Activity  . Alcohol use: No    Comment: occas.  . Drug use: No  . Sexual activity: Yes  Other Topics Concern  . Not on file  Social History Narrative  . Not on file   Social Determinants of Health   Financial Resource Strain: Low Risk   . Difficulty of Paying Living Expenses: Not hard at all  Food Insecurity: No Food Insecurity  . Worried About Charity fundraiser in the Last Year: Never true  . Ran Out of Food in the Last Year: Never true  Transportation Needs: No  Transportation Needs  . Lack of Transportation (Medical): No  . Lack of Transportation (Non-Medical): No  Physical Activity: Inactive  . Days of Exercise per Week: 0 days  . Minutes of Exercise per Session: 0 min  Stress: No Stress Concern Present  . Feeling of Stress : Not at all  Social Connections: Slightly Isolated  . Frequency of Communication with Friends and Family: Never  . Frequency of Social Gatherings with Friends and Family: Never  . Attends Religious Services: 1 to 4 times per year  . Active Member of Clubs or Organizations: Yes  . Attends Archivist Meetings: 1 to 4 times per year  . Marital Status: Married  Human resources officer Violence: Not At Risk  . Fear of Current or Ex-Partner: No  . Emotionally Abused: No  . Physically Abused: No  . Sexually Abused: No    Family History  Problem Relation Age of Onset  . Hypertension Father    Allergies  Allergen Reactions  . Entresto [Sacubitril-Valsartan] Swelling    And bruising of arm  . Phenazopyridine Nausea Only and Other (See Comments)    GI UPSET  . Ramipril Other (See Comments)    unk Other reaction(s): Other (See Comments), Unknown unk   ?  Current Facility-Administered Medications  Medication Dose Route Frequency Provider Last Rate Last Admin  . 0.9 %  sodium chloride infusion   Intravenous PRN Enzo Bi, MD 10 mL/hr at 06/20/19 1609 Rate Verify at 06/20/19 1609  . acetaminophen (TYLENOL) tablet 650 mg  650 mg Oral Q6H PRN Gwynne Edinger, MD   650 mg at 06/21/19 0135  . albuterol (VENTOLIN HFA) 108 (90 Base) MCG/ACT inhaler 2 puff  2 puff Inhalation QID PRN Wouk, Ailene Rud, MD      . cefTRIAXone (ROCEPHIN) 2 g in sodium chloride 0.9 % 100 mL IVPB  2 g Intravenous Q24H Lizzy Hamre, MD      . furosemide (LASIX) injection 40 mg  40 mg Intravenous BID Minna Merritts, MD   40 mg at 06/22/19 1729  . pantoprazole (PROTONIX) EC tablet 40 mg  40 mg Oral Daily Gwynne Edinger, MD   40 mg  at 06/22/19 1206  . potassium chloride SA (KLOR-CON) CR tablet 40 mEq  40 mEq Oral BID Minna Merritts, MD   40 mEq at 06/22/19 1205  . simvastatin (ZOCOR) tablet 20 mg  20 mg Oral Daily Danford, Suann Larry, MD   20 mg at 06/22/19 1724     Abtx:  Anti-infectives (From admission, onward)   Start     Dose/Rate Route Frequency Ordered Stop   06/22/19 2000  cefTRIAXone (ROCEPHIN) 2 g in sodium chloride 0.9 % 100 mL IVPB     2 g 200 mL/hr over 30 Minutes Intravenous Every 24 hours 06/22/19 1607     06/18/19 1200  penicillin G potassium 3 Million Units in dextrose 59mL IVPB  Status:  Discontinued     3 Million Units 100 mL/hr over 30 Minutes Intravenous Every 4 hours 06/18/19 1056 06/18/19 1119   06/18/19 1200  penicillin G potassium 3 Million Units in dextrose 5 % 100 mL IVPB     3 Million Units 200 mL/hr over 30 Minutes Intravenous Every 4 hours 06/18/19 1119 06/22/19 1754   06/17/19 1500  vancomycin (VANCOCIN) IVPB 1000 mg/200 mL premix  Status:  Discontinued     1,000 mg 200 mL/hr over 60 Minutes Intravenous Every 18 hours 06/17/19 0414 06/18/19 1056   06/17/19 0600  piperacillin-tazobactam (ZOSYN) IVPB 3.375 g  Status:  Discontinued     3.375 g 12.5 mL/hr over 240 Minutes Intravenous Every 8 hours 06/16/19 2359 06/18/19 1056   06/16/19 2130  vancomycin (VANCOCIN) IVPB 1000 mg/200 mL premix     1,000 mg 200 mL/hr over 60 Minutes Intravenous  Once 06/16/19 2115 06/16/19 2350   06/16/19 2130  piperacillin-tazobactam (ZOSYN) IVPB 3.375 g     3.375 g 100 mL/hr over 30 Minutes Intravenous  Once 06/16/19 2115 06/16/19 2252      REVIEW OF SYSTEMS:  Const:  fever,  Chills,weight gain Eyes: negative diplopia or visual changes, negative eye pain ENT: negative coryza, negative sore throat Resp: negative cough, hemoptysis,  C/o dyspnea Cards: negative for chest pain, palpitations, has severe  lower extremity edema GU: negative for frequency, dysuria and hematuria GI: Negative for  abdominal pain, diarrhea, bleeding, constipation Skin: negative for rash and pruritus Heme: negative for easy bruising and gum/nose bleeding MS: weakness Neurolo:negative for headaches, dizziness, vertigo, memory problems  Psych: negative for feelings of anxiety, depression  Endocrine: negative for thyroid, diabetes Allergy/Immunology-as above?  Objective:  VITALS:  BP 121/71 (BP Location: Right Arm)   Pulse 61   Temp (!) 97.4 F (36.3 C)  Resp (!) 24   Ht 6\' 2"  (1.88 m)   Wt 103.7 kg   SpO2 96%   BMI 29.35 kg/m  PHYSICAL EXAM:  General: Alert, cooperative, no distress, appears stated age.  Head: Normocephalic, without obvious abnormality, atraumatic. Eyes: Conjunctivae clear, anicteric sclerae. Pupils are equal ENT Nares normal. No drainage or sinus tenderness. Lips, mucosa, and tongue normal. No Thrush Poor dentition Neck: Supple,  Back: No CVA tenderness. Lungs: b/l air entry Heart: systolic murmur, irregular AICD- device site fine Abdomen: Soft, non-tender,not distended. Bowel sounds normal. No masses Scrotal edema  Extremities: severe edmea feet and ankles Skin: No rashes or lesions. Or bruising Scratches over buttock - but does not look infected Lymph: Cervical, supraclavicular normal. Neurologic: Grossly non-focal Pertinent Labs Lab Results CBC    Component Value Date/Time   WBC 8.4 06/22/2019 0436   RBC 3.27 (L) 06/22/2019 0436   HGB 7.6 (L) 06/22/2019 0436   HGB 14.3 02/14/2014 1027   HCT 26.3 (L) 06/22/2019 0436   HCT 43.3 02/14/2014 0546   PLT 232 06/22/2019 0436   PLT 154 02/14/2014 0546   MCV 80.4 06/22/2019 0436   MCV 95 02/14/2014 0546   MCH 23.2 (L) 06/22/2019 0436   MCHC 28.9 (L) 06/22/2019 0436   RDW 20.2 (H) 06/22/2019 0436   RDW 14.1 02/14/2014 0546   LYMPHSABS 0.6 (L) 06/16/2019 2052   LYMPHSABS 1.6 09/14/2012 2047   MONOABS 0.9 06/16/2019 2052   MONOABS 1.3 (H) 09/14/2012 2047   EOSABS 0.2 06/16/2019 2052   EOSABS 0.2 09/14/2012  2047   BASOSABS 0.0 06/16/2019 2052   BASOSABS 0.1 09/14/2012 2047    CMP Latest Ref Rng & Units 06/22/2019 06/21/2019 06/20/2019  Glucose 70 - 99 mg/dL 110(H) 110(H) 98  BUN 8 - 23 mg/dL 20 26(H) 31(H)  Creatinine 0.61 - 1.24 mg/dL 0.99 1.10 1.14  Sodium 135 - 145 mmol/L 139 138 142  Potassium 3.5 - 5.1 mmol/L 3.6 3.6 3.3(L)  Chloride 98 - 111 mmol/L 105 105 106  CO2 22 - 32 mmol/L 26 27 28   Calcium 8.9 - 10.3 mg/dL 7.7(L) 7.8(L) 7.7(L)  Total Protein 6.5 - 8.1 g/dL - - -  Total Bilirubin 0.3 - 1.2 mg/dL - - -  Alkaline Phos 38 - 126 U/L - - -  AST 15 - 41 U/L - - -  ALT 0 - 44 U/L - - -      Microbiology: Recent Results (from the past 240 hour(s))  C Difficile Quick Screen w PCR reflex     Status: None   Collection Time: 06/16/19  2:06 AM   Specimen: STOOL  Result Value Ref Range Status   C Diff antigen NEGATIVE NEGATIVE Final   C Diff toxin NEGATIVE NEGATIVE Final   C Diff interpretation No C. difficile detected.  Final    Comment: Performed at [facility], [address] [place], [place] [address]  Gastrointestinal Panel by PCR , Stool     Status: None   Collection Time: 06/16/19  2:06 AM  Result Value Ref Range Status   Campylobacter species NOT DETECTED NOT DETECTED Final   Plesimonas shigelloides NOT DETECTED NOT DETECTED Final   Salmonella species NOT DETECTED NOT DETECTED Final   Yersinia enterocolitica NOT DETECTED NOT DETECTED Final   Vibrio species NOT DETECTED NOT DETECTED Final   Vibrio cholerae NOT DETECTED NOT DETECTED Final   Enteroaggregative E coli (EAEC) NOT DETECTED NOT DETECTED Final   Enteropathogenic E coli (EPEC) NOT DETECTED  NOT DETECTED Final   Enterotoxigenic E coli (ETEC) NOT DETECTED NOT DETECTED Final   Shiga like toxin producing E coli (STEC) NOT DETECTED NOT DETECTED Final   Shigella/Enteroinvasive E coli (EIEC) NOT DETECTED NOT DETECTED Final   Cryptosporidium NOT DETECTED NOT DETECTED Final   Cyclospora cayetanensis NOT DETECTED  NOT DETECTED Final   Entamoeba histolytica NOT DETECTED NOT DETECTED Final   Giardia lamblia NOT DETECTED NOT DETECTED Final   Adenovirus F40/41 NOT DETECTED NOT DETECTED Final   Astrovirus NOT DETECTED NOT DETECTED Final   Norovirus GI/GII NOT DETECTED NOT DETECTED Final   Rotavirus A NOT DETECTED NOT DETECTED Final   Sapovirus (I, II, IV, and V) NOT DETECTED NOT DETECTED Final    Comment: Performed at [facility], [address], [place], [place] [address]  Blood Culture (routine x 2)     Status: Abnormal   Collection Time: 06/16/19  8:52 PM   Specimen: BLOOD  Result Value Ref Range Status   Specimen Description   Final    BLOOD LEFT FA Performed at [facility], [address] [place], [place] [address]    Special Requests   Final    BOTTLES DRAWN AEROBIC AND ANAEROBIC Blood Culture adequate volume Performed at [facility], [address] [place], [place] [address]    Culture  Setup Time   Final    IN BOTH AEROBIC AND ANAEROBIC BOTTLES GRAM POSITIVE COCCI IN PAIRS CRITICAL RESULT CALLED TO, READ BACK BY AND VERIFIED WITH: SCOTT HALL ON 06/17/19 AT 0420 [facility]    Culture (A)  Final    GROUP B STREP(S.AGALACTIAE)ISOLATED SUSCEPTIBILITIES PERFORMED ON PREVIOUS CULTURE WITHIN THE LAST 5 DAYS. Performed at [facility], [place] [address] [place],  [address]    Report Status 06/19/2019 FINAL  Final  Respiratory Panel by RT PCR (Flu A&B, Covid) - Nasopharyngeal Swab     Status: None   Collection Time: 06/16/19 10:02 PM   Specimen: Nasopharyngeal Swab  Result Value Ref Range Status   SARS Coronavirus 2 by RT PCR NEGATIVE NEGATIVE Final    Comment: (NOTE) SARS-CoV-2 target nucleic acids are NOT DETECTED. The SARS-CoV-2 RNA is generally detectable in upper respiratoy specimens during the acute phase of infection. The lowest concentration of SARS-CoV-2 viral copies this assay can detect is 131 copies/mL. A negative result does not preclude  SARS-Cov-2 infection and should not be used as the sole basis for treatment or other patient management decisions. A negative result may occur with  improper specimen collection/handling, submission of specimen other than nasopharyngeal swab, presence of viral mutation(s) within the areas targeted by this assay, and inadequate number of viral copies (<131 copies/mL). A negative result must be combined with clinical observations, patient history, and epidemiological information. The expected result is Negative. Fact Sheet for Patients:  PinkCheek.be Fact Sheet for Healthcare Providers:  GravelBags.it This test is not yet ap proved or cleared by the [place] FDA and  has been authorized for detection and/or diagnosis of SARS-CoV-2 by FDA under an Emergency Use Authorization (EUA). This EUA will remain  in effect (meaning this test can be used) for the duration of the COVID-19 declaration under Section 564(b)(1) of the Act, 21 U.S.C. section 360bbb-3(b)(1), unless the authorization is terminated or revoked sooner.    Influenza A by PCR NEGATIVE NEGATIVE Final   Influenza B by PCR NEGATIVE NEGATIVE Final    Comment: (NOTE) The Xpert Xpress SARS-CoV-2/FLU/RSV assay is intended as an aid in  the diagnosis of influenza from Nasopharyngeal swab specimens and  should not be used as a sole basis for treatment. Nasal washings and  aspirates are unacceptable for Xpert Xpress SARS-CoV-2/FLU/RSV  testing. Fact Sheet for Patients: PinkCheek.be Fact Sheet for Healthcare Providers: GravelBags.it This test is not yet approved or cleared by the [place] FDA and  has been authorized for detection and/or diagnosis of SARS-CoV-2 by  FDA under an Emergency Use Authorization (EUA). This EUA will remain  in effect (meaning this test can be used) for the duration of the  Covid-19  declaration under Section 564(b)(1) of the Act, 21  U.S.C. section 360bbb-3(b)(1), unless the authorization is  terminated or revoked. Performed at [facility], [address] [place], [place] [address]   Blood Culture (routine x 2)     Status: Abnormal   Collection Time: 06/16/19 10:03 PM   Specimen: BLOOD  Result Value Ref Range Status   Specimen Description   Final    BLOOD LEFT FA Performed at [facility], [address] [place], [place] [address]    Special Requests   Final    BOTTLES DRAWN AEROBIC AND ANAEROBIC Blood Culture adequate volume Performed at [facility], [address] [place], [place] [address]    Culture  Setup Time   Final    Organism ID to follow IN BOTH AEROBIC AND ANAEROBIC BOTTLES GRAM POSITIVE COCCI IN PAIRS CRITICAL VALUE NOTED.  VALUE IS CONSISTENT WITH PREVIOUSLY REPORTED AND CALLED VALUE. Performed at [facility], [place] [address], [place], [place] [address]    Culture GROUP B STREP(S.AGALACTIAE)ISOLATED (A)  Final   Report Status 06/19/2019 FINAL  Final   Organism ID, Bacteria GROUP B STREP(S.AGALACTIAE)ISOLATED  Final      Susceptibility   Group b strep(s.agalactiae)isolated - MIC*    CLINDAMYCIN >=1 RESISTANT Resistant     AMPICILLIN <=0.25 SENSITIVE Sensitive     ERYTHROMYCIN >=8 RESISTANT Resistant     VANCOMYCIN 0.5 SENSITIVE Sensitive     CEFTRIAXONE <=0.12 SENSITIVE Sensitive     LEVOFLOXACIN 1 SENSITIVE Sensitive     * GROUP B STREP(S.AGALACTIAE)ISOLATED  Blood Culture ID Panel (Reflexed)     Status: Abnormal   Collection Time: 06/16/19 10:03 PM  Result Value Ref Range Status   Enterococcus species NOT DETECTED NOT DETECTED Final   Listeria monocytogenes NOT DETECTED NOT DETECTED Final   Staphylococcus species NOT DETECTED NOT DETECTED Final   Staphylococcus aureus (BCID) NOT DETECTED NOT DETECTED Final   Streptococcus species DETECTED (A) NOT DETECTED Final    Comment: CRITICAL RESULT CALLED  TO, READ BACK BY AND VERIFIED WITH: SCOTT HALL ON 06/17/19 AT 0608 [facility]    Streptococcus agalactiae DETECTED (A) NOT DETECTED Final    Comment: CRITICAL RESULT CALLED TO, READ BACK BY AND VERIFIED WITH: SCOTT HALL ON 06/17/19 AT 0608 [facility]    Streptococcus pneumoniae NOT DETECTED NOT DETECTED Final   Streptococcus pyogenes NOT DETECTED NOT DETECTED Final   Acinetobacter baumannii NOT DETECTED NOT DETECTED Final   Enterobacteriaceae species NOT DETECTED NOT DETECTED Final   Enterobacter cloacae complex NOT DETECTED NOT DETECTED Final   Escherichia coli NOT DETECTED NOT DETECTED Final   Klebsiella oxytoca NOT DETECTED NOT DETECTED Final   Klebsiella pneumoniae NOT DETECTED NOT DETECTED Final   Proteus species NOT DETECTED NOT DETECTED Final   Serratia marcescens NOT DETECTED NOT DETECTED Final   Haemophilus influenzae NOT DETECTED NOT DETECTED Final   Neisseria meningitidis NOT DETECTED NOT DETECTED  Final   Pseudomonas aeruginosa NOT DETECTED NOT DETECTED Final   Candida albicans NOT DETECTED NOT DETECTED Final   Candida glabrata NOT DETECTED NOT DETECTED Final   Candida krusei NOT DETECTED NOT DETECTED Final   Candida parapsilosis NOT DETECTED NOT DETECTED Final   Candida tropicalis NOT DETECTED NOT DETECTED Final    Comment: Performed at [facility], [address], [place], [place] [address]  Urine culture     Status: None   Collection Time: 06/16/19 11:04 PM   Specimen: In/Out Cath Urine  Result Value Ref Range Status   Specimen Description   Final    IN/OUT CATH URINE Performed at [facility], [address], [place], [place] [address]    Special Requests   Final    NONE Performed at [facility], [address] [place], [place] [address]    Culture   Final    NO GROWTH Performed at [facility], [place] [address], [place], [place] [address]    Report Status 06/18/2019 FINAL  Final  Culture, blood (Routine X 2) w Reflex to ID Panel      Status: None (Preliminary result)   Collection Time: 06/18/19  5:07 PM   Specimen: BLOOD  Result Value Ref Range Status   Specimen Description BLOOD BLOOD LEFT HAND  Final   Special Requests   Final    BOTTLES DRAWN AEROBIC AND ANAEROBIC Blood Culture adequate volume   Culture   Final    NO GROWTH 4 DAYS Performed at [facility], [address] [place], [place] [address]    Report Status PENDING  Incomplete  Culture, blood (Routine X 2) w Reflex to ID Panel     Status: None (Preliminary result)   Collection Time: 06/18/19  5:07 PM   Specimen: BLOOD  Result Value Ref Range Status   Specimen Description BLOOD BLOOD RIGHT HAND  Final   Special Requests AEROBIC BOTTLE ONLY Blood Culture adequate volume  Final   Culture   Final    NO GROWTH 4 DAYS Performed at [facility], [address], [place], [place] [address]    Report Status PENDING  Incomplete    IMAGING RESULTS: CXR- cardiomegaly with vascular congestion   have personally reviewed the films  2 d echo EF 35-40% = LA size severely dilated   ? Impression/Recommendation ? ? ?Group B streptococcal bacteremia- unclear source- no skin lesions. Could be from GI tract especially with GI bleeding. TEE neg for endocarditis. But with AICD risk for lead vegetation is high. Hence will treat with minimum 4 weeks of IV ceftriaxone- end date 07/14/19 Avoid penicillin because of  Q4 frequency with risk of sodium and fluid overload  CHF with anasarca- on Frusemide CAD with CABG Paroxysmal Afib Nodular liver- cirrhosis likely  Hyperlipidemia- on simvastatin  Recurrent GI bleed- AVM  AKI- resolved  Anemia-GI bleed  __________________________________________________ Discussed with patient,and care team .

## 2019-06-23 ENCOUNTER — Inpatient Hospital Stay: Payer: PPO

## 2019-06-23 DIAGNOSIS — I25708 Atherosclerosis of coronary artery bypass graft(s), unspecified, with other forms of angina pectoris: Secondary | ICD-10-CM

## 2019-06-23 DIAGNOSIS — H9193 Unspecified hearing loss, bilateral: Secondary | ICD-10-CM

## 2019-06-23 LAB — CBC
HCT: 27.8 % — ABNORMAL LOW (ref 39.0–52.0)
Hemoglobin: 8.1 g/dL — ABNORMAL LOW (ref 13.0–17.0)
MCH: 23.3 pg — ABNORMAL LOW (ref 26.0–34.0)
MCHC: 29.1 g/dL — ABNORMAL LOW (ref 30.0–36.0)
MCV: 80.1 fL (ref 80.0–100.0)
Platelets: 305 10*3/uL (ref 150–400)
RBC: 3.47 MIL/uL — ABNORMAL LOW (ref 4.22–5.81)
RDW: 20.3 % — ABNORMAL HIGH (ref 11.5–15.5)
WBC: 7.5 10*3/uL (ref 4.0–10.5)
nRBC: 0.3 % — ABNORMAL HIGH (ref 0.0–0.2)

## 2019-06-23 LAB — BASIC METABOLIC PANEL
Anion gap: 8 (ref 5–15)
BUN: 19 mg/dL (ref 8–23)
CO2: 27 mmol/L (ref 22–32)
Calcium: 7.8 mg/dL — ABNORMAL LOW (ref 8.9–10.3)
Chloride: 104 mmol/L (ref 98–111)
Creatinine, Ser: 1.03 mg/dL (ref 0.61–1.24)
GFR calc Af Amer: >60 mL/min (ref >60)
GFR calc non Af Amer: >60 mL/min (ref >60)
Glucose, Bld: 105 mg/dL — ABNORMAL HIGH (ref 70–99)
Potassium: 4 mmol/L (ref 3.5–5.1)
Sodium: 139 mmol/L (ref 135–145)

## 2019-06-23 LAB — C-REACTIVE PROTEIN: CRP: 1.2 mg/dL — ABNORMAL HIGH (ref <1.0)

## 2019-06-23 LAB — SEDIMENTATION RATE: Sed Rate: 40 mm/hr — ABNORMAL HIGH (ref 0–20)

## 2019-06-23 LAB — MAGNESIUM: Magnesium: 2 mg/dL (ref 1.7–2.4)

## 2019-06-23 MED ORDER — LOSARTAN POTASSIUM 25 MG PO TABS
12.5000 mg | ORAL_TABLET | Freq: Every day | ORAL | Status: DC
  Administered 2019-06-24 – 2019-06-25 (×2): 12.5 mg via ORAL
  Filled 2019-06-23 (×2): qty 1

## 2019-06-23 MED ORDER — SODIUM CHLORIDE 0.9% FLUSH: 10.0000 mL | INTRAVENOUS | Status: DC | PRN

## 2019-06-23 MED ORDER — FUROSEMIDE 10 MG/ML IJ SOLN
60.0000 mg | Freq: Two times a day (BID) | INTRAMUSCULAR | Status: AC
  Administered 2019-06-23 – 2019-06-24 (×2): 60 mg via INTRAVENOUS
  Filled 2019-06-23 (×2): qty 8

## 2019-06-23 MED ORDER — FERROUS SULFATE 325 (65 FE) MG PO TABS
325.0000 mg | ORAL_TABLET | Freq: Every day | ORAL | Status: DC
  Administered 2019-06-24 – 2019-06-25 (×2): 325 mg via ORAL
  Filled 2019-06-23 (×2): qty 1

## 2019-06-23 MED ORDER — CHLORHEXIDINE GLUCONATE CLOTH 2 % EX PADS
6.0000 | MEDICATED_PAD | Freq: Every day | CUTANEOUS | Status: DC
  Administered 2019-06-23 – 2019-06-25 (×3): 6 via TOPICAL

## 2019-06-23 NOTE — Progress Notes (Signed)
PROGRESS NOTE    Thomas [name]  [id number] DOB: Dec 03, 1934 DOA: 06/16/2019 PCP: [name], MD      Brief Narrative:  Mr. Situ is a 85 y.o. M with deafness, sCHF EF 35-40%, chronic anasarca, CAD, BiV ICD/pacemaker, HTN, chronic A. fib, possible cirrhosis, and history of GI bleed due to AVMs who presented with chills, generalized malaise, and vomiting for several days.  Heart, chest x-ray unremarkable, antibiotics were started empirically.  Culture subsequently turned positive for group B strep.         Assessment & Plan:  Sepsis secondary to Group B strep bacteremia Patient presented with leukocytosis, fever, AKI, elevated lactic acid.  Started on empiric antibiotics.  Blood cultures growing group B strep.  TEE 4/8 unremarkable.  ID consulted, recommended 4 weeks antibiotics. PICC in place. -Continue antibiotics, day 8 of 28 -Agree with transition to ceftriaxone in order to reduce sodium load -Consult infectious disease   Acute on chronic systolic CHF Diuretics had mostly been held, use very cautiously until now.  On echocardiogram today, noted dilated right ventricle, elevated right heart pressures, suggesting significant fluid overload  Net -900 cc yesterday, potassium and creatinine stable. -Continue IV Lasix twice daily, increased dose -Continue potassium supplement -Daily BMP -Applying Unna boots -Strict ins and outs  Hypertension Coronary disease -Resolved -Continue simvastatin -Hold home Imdur  Chronic atrial fibrillation Not on rate control agents, heart rate controlled -Hold anticoagulation due to severe anemia, eventually will resume  Possible cirrhosis   Chronic anemia, normocytic  Hemoglobin stable, no clinical bleeding -Resume home iron  Pressure injury, stage II, buttocks, present on arrival   Severe protein calorie malnutrition As evidenced by severe depletion of subcutaneous muscle mass, chronic illness, poor oral intake, and  pressure injury.         Disposition: The patient was admitted with streptococcal bacteremia and sepsis.  He has undergone work-up for streptococcal bacteremia, and ID recommended PICC line placement in 4 weeks of IV antibiotics which is being arranged.  In the meantime echocardiogram showed significant fluid overload and he is very edematous on exam.  We will continue IV Lasix as he remains severely edematous, and apply Unna boots.  Hopefully in the next 2 days we will be able to wean off his IV Lasix, resume torsemide, and transition back to home        MDM: The below labs and imaging reports reviewed and summarized above.  Medication management as above.  This is an acute illness that poses severe threat to life and bodily function, exacerbated by CHF flare.    DVT prophylaxis: SCDs Code Status: Full code Family Communication: Attempted call to wife, no answer, left VM through interpreter    Consultants:   Cardiology  Infectious disease  Procedures:   4/5 TTE  4/8 TEE  Antimicrobials:   Vancomycin and Zosyn 4/2-4/4  Penicillin 4/4>>4/8  Ceftriaxone 4/8 >>   Culture data:   4/2 blood culture x2-group B strep  4/2 Urine culture no growth  4/4 blood culture x2 -- no growth          Subjective: His arm on the right is little sore from where the PICC line was placed.  He still has severe swelling.  He still is pretty weak, but has no chest pain, focal weakness, numbness  Objective: Vitals:   06/22/19 2112 06/23/19 0501 06/23/19 1003 06/23/19 1219  BP: 118/63 116/66 110/63 (!) 108/55  Pulse: 65 64 71 60  Resp: 20 20  Temp: 97.9 F (36.6 C) 98.3 F (36.8 C)    TempSrc: Oral Oral    SpO2: 90% 95% 97% 94%  Weight:      Height:        Intake/Output Summary (Last 24 hours) at 06/23/2019 1727 Last data filed at 06/23/2019 1705 Gross per 24 hour  Intake 406.15 ml  Output 1250 ml  Net -843.85 ml   Filed Weights   06/18/19 0540 06/19/19  0437 06/22/19 0255  Weight: 103.6 kg 103.6 kg 103.7 kg    Examination: General appearance: Elderly adult male, debilitated, lying in bed, no acute distress HEENT: Anicteric, dry, pink, lids lashes normal.  No nasal deformity, discharge, epistaxis.  Lips moist, oropharynx moist, no oral lesions, dentition in adequate repair,. Skin: Warm and dry, numerous senile purpura, no jaundice. Cardiac: Regular rate and rhythm, systolic murmur noted, JVP not visible, marked lower extremity edema, weeping, left arm swelling is improved Respiratory: Normal respiratory rate and rhythm at rest, lungs clear without rales or wheezes, diminished by Abdomen: Abdomen soft without tenderness palpation or guarding MSK: Diffuse loss of subcutaneous muscle mass and fat.  Left arm swelling is better. Neuro: Awake and alert, extraocular movements intact, moves all extremities, speech fluent. deaf Psych: Sensorium intact and responding questions, attention normal, affect normal, judgment insight appear normal    Data Reviewed: I have personally reviewed following labs and imaging studies:  CBC: Recent Labs  Lab 06/16/19 2052 06/17/19 0321 06/19/19 0534 06/20/19 0544 06/21/19 0610 06/22/19 0436 06/23/19 0546  WBC 17.9*   < > 9.9 8.5 9.3 8.4 7.5  NEUTROABS 16.1*  --   --   --   --   --   --   HGB 8.5*   < > 8.1* 8.0* 8.0* 7.6* 8.1*  HCT 27.7*   < > 28.5* 27.8* 27.2* 26.3* 27.8*  MCV 78.0*   < > 81.0 80.3 79.5* 80.4 80.1  PLT 206   < > 195 203 222 232 305   < > = values in this interval not displayed.   Basic Metabolic Panel: Recent Labs  Lab 06/19/19 0534 06/20/19 0544 06/21/19 0610 06/22/19 0436 06/23/19 0546  NA 140 142 138 139 139  K 3.7 3.3* 3.6 3.6 4.0  CL 107 106 105 105 104  CO2 29 28 27 26 27   GLUCOSE 114* 98 110* 110* 105*  BUN 39* 31* 26* 20 19  CREATININE 1.33* 1.14 1.10 0.99 1.03  CALCIUM 7.8* 7.7* 7.8* 7.7* 7.8*  MG 2.0 1.9 2.1 1.9 2.0   GFR: Estimated Creatinine Clearance: 71  mL/min (by C-G formula based on SCr of 1.03 mg/dL). Liver Function Tests: Recent Labs  Lab 06/16/19 2052 06/17/19 0321  AST 51* 44*  ALT 17 15  ALKPHOS 100 80  BILITOT 1.1 1.1  PROT 5.2* 4.4*  ALBUMIN 2.1* 1.7*   Recent Labs  Lab 06/16/19 2052  LIPASE 17   No results for input(s): AMMONIA in the last 168 hours. Coagulation Profile: Recent Labs  Lab 06/16/19 2052  INR 1.4*   Cardiac Enzymes: No results for input(s): CKTOTAL, CKMB, CKMBINDEX, TROPONINI in the last 168 hours. BNP (last 3 results) No results for input(s): PROBNP in the last 8760 hours. HbA1C: No results for input(s): HGBA1C in the last 72 hours. CBG: No results for input(s): GLUCAP in the last 168 hours. Lipid Profile: No results for input(s): CHOL, HDL, LDLCALC, TRIG, CHOLHDL, LDLDIRECT in the last 72 hours. Thyroid Function Tests: No results for input(s): TSH, T4TOTAL,  FREET4, T3FREE, THYROIDAB in the last 72 hours. Anemia Panel: No results for input(s): VITAMINB12, FOLATE, FERRITIN, TIBC, IRON, RETICCTPCT in the last 72 hours. Urine analysis:    Component Value Date/Time   COLORURINE YELLOW (A) 06/16/2019 2304   APPEARANCEUR HAZY (A) 06/16/2019 2304   APPEARANCEUR Clear 12/13/2017 0918   LABSPEC 1.014 06/16/2019 2304   LABSPEC 1.021 11/19/2011 1501   PHURINE 5.0 06/16/2019 2304   GLUCOSEU NEGATIVE 06/16/2019 2304   GLUCOSEU Negative 11/19/2011 1501   HGBUR NEGATIVE 06/16/2019 2304   BILIRUBINUR NEGATIVE 06/16/2019 2304   BILIRUBINUR Negative 12/13/2017 0918   BILIRUBINUR Negative 11/19/2011 1501   KETONESUR NEGATIVE 06/16/2019 2304   PROTEINUR 30 (A) 06/16/2019 2304   NITRITE NEGATIVE 06/16/2019 2304   LEUKOCYTESUR NEGATIVE 06/16/2019 2304   LEUKOCYTESUR Negative 11/19/2011 1501   Sepsis Labs: @LABRCNTIP (procalcitonin:4,lacticacidven:4)  ) Recent Results (from the past 240 hour(s))  C Difficile Quick Screen w PCR reflex     Status: None   Collection Time: 06/16/19  2:06 AM   Specimen:  STOOL  Result Value Ref Range Status   C Diff antigen NEGATIVE NEGATIVE Final   C Diff toxin NEGATIVE NEGATIVE Final   C Diff interpretation No C. difficile detected.  Final    Comment: Performed at [facility], [address] [place], [place] [address]  Gastrointestinal Panel by PCR , Stool     Status: None   Collection Time: 06/16/19  2:06 AM  Result Value Ref Range Status   Campylobacter species NOT DETECTED NOT DETECTED Final   Plesimonas shigelloides NOT DETECTED NOT DETECTED Final   Salmonella species NOT DETECTED NOT DETECTED Final   Yersinia enterocolitica NOT DETECTED NOT DETECTED Final   Vibrio species NOT DETECTED NOT DETECTED Final   Vibrio cholerae NOT DETECTED NOT DETECTED Final   Enteroaggregative E coli (EAEC) NOT DETECTED NOT DETECTED Final   Enteropathogenic E coli (EPEC) NOT DETECTED NOT DETECTED Final   Enterotoxigenic E coli (ETEC) NOT DETECTED NOT DETECTED Final   Shiga like toxin producing E coli (STEC) NOT DETECTED NOT DETECTED Final   Shigella/Enteroinvasive E coli (EIEC) NOT DETECTED NOT DETECTED Final   Cryptosporidium NOT DETECTED NOT DETECTED Final   Cyclospora cayetanensis NOT DETECTED NOT DETECTED Final   Entamoeba histolytica NOT DETECTED NOT DETECTED Final   Giardia lamblia NOT DETECTED NOT DETECTED Final   Adenovirus F40/41 NOT DETECTED NOT DETECTED Final   Astrovirus NOT DETECTED NOT DETECTED Final   Norovirus GI/GII NOT DETECTED NOT DETECTED Final   Rotavirus A NOT DETECTED NOT DETECTED Final   Sapovirus (I, II, IV, and V) NOT DETECTED NOT DETECTED Final    Comment: Performed at [facility], [place], [address] [place], [place] [address]  Blood Culture (routine x 2)     Status: Abnormal   Collection Time: 06/16/19  8:52 PM   Specimen: BLOOD  Result Value Ref Range Status   Specimen Description   Final    BLOOD LEFT FA Performed at [facility], [address], [place], [place] [address]    Special Requests   Final      BOTTLES DRAWN AEROBIC AND ANAEROBIC Blood Culture adequate volume Performed at [facility], [address] [place], [place] [address]    Culture  Setup Time   Final    IN BOTH AEROBIC AND ANAEROBIC BOTTLES GRAM POSITIVE COCCI IN PAIRS CRITICAL RESULT CALLED TO, READ BACK BY AND VERIFIED WITH: SCOTT HALL ON 06/17/19 AT 0420 [facility]    Culture (  A)  Final    GROUP B STREP(S.AGALACTIAE)ISOLATED SUSCEPTIBILITIES PERFORMED ON PREVIOUS CULTURE WITHIN THE LAST 5 DAYS. Performed at [facility], [place] [address], [place], [place] [address]    Report Status 06/19/2019 FINAL  Final  Respiratory Panel by RT PCR (Flu A&B, Covid) - Nasopharyngeal Swab     Status: None   Collection Time: 06/16/19 10:02 PM   Specimen: Nasopharyngeal Swab  Result Value Ref Range Status   SARS Coronavirus 2 by RT PCR NEGATIVE NEGATIVE Final    Comment: (NOTE) SARS-CoV-2 target nucleic acids are NOT DETECTED. The SARS-CoV-2 RNA is generally detectable in upper respiratoy specimens during the acute phase of infection. The lowest concentration of SARS-CoV-2 viral copies this assay can detect is 131 copies/mL. A negative result does not preclude SARS-Cov-2 infection and should not be used as the sole basis for treatment or other patient management decisions. A negative result may occur with  improper specimen collection/handling, submission of specimen other than nasopharyngeal swab, presence of viral mutation(s) within the areas targeted by this assay, and inadequate number of viral copies (<131 copies/mL). A negative result must be combined with clinical observations, patient history, and epidemiological information. The expected result is Negative. Fact Sheet for Patients:  PinkCheek.be Fact Sheet for Healthcare Providers:  GravelBags.it This test is not yet ap proved or cleared by the [place] FDA and  has been authorized for detection  and/or diagnosis of SARS-CoV-2 by FDA under an Emergency Use Authorization (EUA). This EUA will remain  in effect (meaning this test can be used) for the duration of the COVID-19 declaration under Section 564(b)(1) of the Act, 21 U.S.C. section 360bbb-3(b)(1), unless the authorization is terminated or revoked sooner.    Influenza A by PCR NEGATIVE NEGATIVE Final   Influenza B by PCR NEGATIVE NEGATIVE Final    Comment: (NOTE) The Xpert Xpress SARS-CoV-2/FLU/RSV assay is intended as an aid in  the diagnosis of influenza from Nasopharyngeal swab specimens and  should not be used as a sole basis for treatment. Nasal washings and  aspirates are unacceptable for Xpert Xpress SARS-CoV-2/FLU/RSV  testing. Fact Sheet for Patients: PinkCheek.be Fact Sheet for Healthcare Providers: GravelBags.it This test is not yet approved or cleared by the [place] FDA and  has been authorized for detection and/or diagnosis of SARS-CoV-2 by  FDA under an Emergency Use Authorization (EUA). This EUA will remain  in effect (meaning this test can be used) for the duration of the  Covid-19 declaration under Section 564(b)(1) of the Act, 21  U.S.C. section 360bbb-3(b)(1), unless the authorization is  terminated or revoked. Performed at [facility], [address] [place], [place] [address]   Blood Culture (routine x 2)     Status: Abnormal   Collection Time: 06/16/19 10:03 PM   Specimen: BLOOD  Result Value Ref Range Status   Specimen Description   Final    BLOOD LEFT FA Performed at [facility], [address] [place], [place] [address]    Special Requests   Final    BOTTLES DRAWN AEROBIC AND ANAEROBIC Blood Culture adequate volume Performed at [facility], [address] [place],  [address]    Culture  Setup Time   Final    Organism ID to follow IN BOTH AEROBIC AND ANAEROBIC BOTTLES GRAM POSITIVE  COCCI IN PAIRS CRITICAL VALUE NOTED.  VALUE IS CONSISTENT WITH PREVIOUSLY REPORTED AND CALLED VALUE. Performed at [facility], [place] [address], [place], [place]  [address]    Culture GROUP B STREP(S.AGALACTIAE)ISOLATED (A)  Final   Report Status 06/19/2019 FINAL  Final   Organism ID, Bacteria GROUP B STREP(S.AGALACTIAE)ISOLATED  Final      Susceptibility   Group b strep(s.agalactiae)isolated - MIC*    CLINDAMYCIN >=1 RESISTANT Resistant     AMPICILLIN <=0.25 SENSITIVE Sensitive     ERYTHROMYCIN >=8 RESISTANT Resistant     VANCOMYCIN 0.5 SENSITIVE Sensitive     CEFTRIAXONE <=0.12 SENSITIVE Sensitive     LEVOFLOXACIN 1 SENSITIVE Sensitive     * GROUP B STREP(S.AGALACTIAE)ISOLATED  Blood Culture ID Panel (Reflexed)     Status: Abnormal   Collection Time: 06/16/19 10:03 PM  Result Value Ref Range Status   Enterococcus species NOT DETECTED NOT DETECTED Final   Listeria monocytogenes NOT DETECTED NOT DETECTED Final   Staphylococcus species NOT DETECTED NOT DETECTED Final   Staphylococcus aureus (BCID) NOT DETECTED NOT DETECTED Final   Streptococcus species DETECTED (A) NOT DETECTED Final    Comment: CRITICAL RESULT CALLED TO, READ BACK BY AND VERIFIED WITH: SCOTT HALL ON 06/17/19 AT 0608 [facility]    Streptococcus agalactiae DETECTED (A) NOT DETECTED Final    Comment: CRITICAL RESULT CALLED TO, READ BACK BY AND VERIFIED WITH: SCOTT HALL ON 06/17/19 AT 0608 [facility]    Streptococcus pneumoniae NOT DETECTED NOT DETECTED Final   Streptococcus pyogenes NOT DETECTED NOT DETECTED Final   Acinetobacter baumannii NOT DETECTED NOT DETECTED Final   Enterobacteriaceae species NOT DETECTED NOT DETECTED Final   Enterobacter cloacae complex NOT DETECTED NOT DETECTED Final   Escherichia coli NOT DETECTED NOT DETECTED Final   Klebsiella oxytoca NOT DETECTED NOT DETECTED Final   Klebsiella pneumoniae NOT DETECTED NOT DETECTED Final   Proteus species NOT DETECTED NOT DETECTED Final   Serratia marcescens NOT  DETECTED NOT DETECTED Final   Haemophilus influenzae NOT DETECTED NOT DETECTED Final   Neisseria meningitidis NOT DETECTED NOT DETECTED Final   Pseudomonas aeruginosa NOT DETECTED NOT DETECTED Final   Candida albicans NOT DETECTED NOT DETECTED Final   Candida glabrata NOT DETECTED NOT DETECTED Final   Candida krusei NOT DETECTED NOT DETECTED Final   Candida parapsilosis NOT DETECTED NOT DETECTED Final   Candida tropicalis NOT DETECTED NOT DETECTED Final    Comment: Performed at [facility], [address] [place], [place] [address]  Urine culture     Status: None   Collection Time: 06/16/19 11:04 PM   Specimen: In/Out Cath Urine  Result Value Ref Range Status   Specimen Description   Final    IN/OUT CATH URINE Performed at [facility], [address] [place], [place] [address]    Special Requests   Final    NONE Performed at [facility], [address], [place], [place] [address]    Culture   Final    NO GROWTH Performed at [facility], [place] [address] [place], [place] [address]    Report Status 06/18/2019 FINAL  Final  Culture, blood (Routine X 2) w Reflex to ID Panel     Status: None (Preliminary result)   Collection Time: 06/18/19  5:07 PM   Specimen: BLOOD  Result Value Ref Range Status   Specimen Description BLOOD BLOOD LEFT HAND  Final   Special Requests   Final    BOTTLES DRAWN AEROBIC AND ANAEROBIC Blood Culture adequate volume   Culture   Final    NO GROWTH 4 DAYS Performed at [facility], [place],  [place], [place] [address]    Report Status PENDING  Incomplete  Culture, blood (Routine X 2) w Reflex to ID Panel     Status: None (Preliminary result)   Collection Time: 06/18/19  5:07 PM   Specimen: BLOOD  Result Value Ref Range Status   Specimen Description BLOOD BLOOD RIGHT HAND  Final   Special Requests AEROBIC BOTTLE ONLY Blood Culture adequate volume  Final   Culture   Final    NO GROWTH 4  DAYS Performed at [facility], [address], [place], [place] [address]    Report Status PENDING  Incomplete         Radiology Studies: US Venous Img Upper Uni Left (DVT)  Result Date: 06/23/2019 CLINICAL DATA:  Left upper extremity pain and edema. EXAM: LEFT UPPER EXTREMITY VENOUS DOPPLER ULTRASOUND TECHNIQUE: Gray-scale sonography with graded compression, as well as color Doppler and duplex ultrasound were performed to evaluate the upper extremity deep venous system from the level of the subclavian vein and including the jugular, axillary, basilic, radial, ulnar and upper cephalic vein. Spectral Doppler was utilized to evaluate flow at rest and with distal augmentation maneuvers. COMPARISON:  None. FINDINGS: Contralateral Subclavian Vein: Respiratory phasicity is normal and symmetric with the symptomatic side. No evidence of thrombus. Normal compressibility. Internal Jugular Vein: No evidence of thrombus. Normal compressibility, respiratory phasicity and response to augmentation. Subclavian Vein: No evidence of thrombus. Normal compressibility, respiratory phasicity and response to augmentation. Axillary Vein: No evidence of thrombus. Normal compressibility, respiratory phasicity and response to augmentation. Cephalic Vein: No evidence of thrombus. Normal compressibility, respiratory phasicity and response to augmentation. Basilic Vein: No evidence of thrombus. Normal compressibility, respiratory phasicity and response to augmentation. Brachial Veins: No evidence of thrombus. Normal compressibility, respiratory phasicity and response to augmentation. Radial Veins: No evidence of thrombus. Normal compressibility, respiratory phasicity and response to augmentation. Ulnar Veins: No evidence of thrombus. Normal compressibility, respiratory phasicity and response to augmentation. Venous Reflux:  None visualized. Other Findings: No evidence of superficial thrombophlebitis or abnormal fluid  collection. IMPRESSION: No evidence of DVT within the left upper extremity. Electronically Signed   By: Aletta Edouard M.D.   On: 06/23/2019 09:19   DG Chest Port 1 View  Result Date: 06/23/2019 CLINICAL DATA:  Status post PICC line placement EXAM: PORTABLE CHEST 1 VIEW COMPARISON:  06/16/2019 FINDINGS: Cardiac shadow is enlarged but stable. Aortic calcifications are seen. Defibrillator is again noted and stable. Stable left basilar atelectasis is seen. Vascular congestion is noted. PICC line is now seen with the tip superimposed over the superior aspect of the right atrium. No other focal abnormality is noted. IMPRESSION: PICC line in the superior right atrium. Stable left basilar atelectasis and vascular congestion. Electronically Signed   By: Inez Catalina M.D.   On: 06/23/2019 12:42   [place] EKG SITE RITE  Result Date: 06/22/2019 If Site Rite image not attached, placement could not be confirmed due to current cardiac rhythm.       Scheduled Meds:  Chlorhexidine Gluconate Cloth  6 each Topical Daily   [START ON 06/24/2019] ferrous sulfate  325 mg Oral Q breakfast   furosemide  60 mg Intravenous BID   [START ON 06/24/2019] losartan  12.5 mg Oral Daily   pantoprazole  40 mg Oral Daily   potassium chloride  40 mEq Oral BID   simvastatin  20 mg Oral Daily   Continuous Infusions:  sodium chloride Stopped (06/23/19 1705)   cefTRIAXone (ROCEPHIN)  IV 2 g (  06/23/19 1705)     LOS: 7 days    Time spent: 35 minutes    Edwin Dada, MD [facility] 06/23/2019, 5:27 PM     Please page though [place] or Epic secure chat:  For [employer], Adult nurse

## 2019-06-23 NOTE — Progress Notes (Signed)
Transesophageal Echocardiogram :  Indication: Bacteremia, thickened pacer wire on transthoracic echo Requesting/ordering  physician: C. Danford  Procedure: Benzocaine spray x2 and 2 mls x 2 of viscous lidocaine were given orally to provide local anesthesia to the oropharynx. The patient was positioned supine on the left side, bite block provided. The patient was moderately sedated with the doses of versed and fentanyl as detailed below.  Using digital technique an omniplane probe was advanced into the distal esophagus without incident.   Moderate sedation: 1. Sedation used:  Versed: 3 mg, Fentanyl: 75 ug 2. Time administered:   9:40 AM time when patient started recovery: 10:10 AM 3. I was face to face during this time total sedation time 30 minutes  See report in EPIC  for complete details: In brief, transgastric imaging revealed mildly depressed LV function with and no mural apical thrombus.  .  Estimated ejection fraction was 40%.  Mild global hypokinesis.  right sided cardiac chambers were normal   Moderate tricuspid valve regurgitation Pacer wires without vegetation No valvular vegetation noted Mild eccentric MR, mild AI  Imaging of the septum showed no ASD or VSD Bubble study was positive for shunt 2D and color flow confirmed there is a small PFO  The LA was well visualized in orthogonal views.  There was  spontaneous contrast and no thrombus in the LA.   Unable to exclude small thrombus in the base of the LA appendage , difficult to visualize  The descending thoracic aorta had mild diffuse mural atherosclerosis/aortic debris with no evidence of aneurysmal dilation or disection   Thomas [name] 06/22/2019 10:12 AM

## 2019-06-23 NOTE — TOC Initial Note (Signed)
Transition of Care [facility]) - Initial/Assessment Note    Patient Details  Name: Thomas [name] MRN: [id number] Date of Birth: 26-Mar-1934  Transition of Care [facility]) CM/SW Contact:    Candie Chroman, LCSW Phone Number: 06/23/2019, 3:47 PM  Clinical Narrative: Readmission prevention screen complete. CSW met with patient using ASL interpreter (First [phone] before video cut out, then used [place] [id number]). No supports at bedside. CSW introduced role and explained that discharge planning would be discussed. Patient lives home with his wife with whom he has been married 40 years. PCP is Dr. Kary Kos. [facility] [address]. No issues getting medications. Patient was active with [facility] prior to admission. Notified him that they will work with [facility] for his home IV abx. Patient uses a walker and wheelchair at home. He has two of both. Patient aware MD wants to diurese him for a few more days before he discharges. Wife will pick him up at discharge but will have to borrow someone's car to do so. No further concerns. CSW encouraged patient to contact CSW as needed. CSW will continue to follow patient for support and facilitate return home when stable.                Expected Discharge Plan: [place] Barriers to Discharge: Continued Medical Work up   Patient Goals and CMS Choice     Choice offered to / list presented to : NA  Expected Discharge Plan and Services Expected Discharge Plan: [place] Acute Care Choice: Resumption of Svcs/PTA Provider Living arrangements for the past 2 months: Single Family Home                           HH Arranged: RN, PT, IV Antibiotics HH Agency: [facility]) Date [place]: 06/23/19   Representative spoke with at [place]: [name]: Marye Round, [facility]: Carolynn Sayers  Prior Living Arrangements/Services Living arrangements for the past 2 months:  Single Family Home Lives with:: Spouse Patient language and need for interpreter reviewed:: Yes(ASL) Do you feel safe going back to the place where you live?: Yes      Need for Family Participation in Patient Care: Yes (Comment) Care giver support system in place?: Yes (comment) Current home services: DME, Home PT Criminal Activity/Legal Involvement Pertinent to Current Situation/Hospitalization: No - Comment as needed  Activities of Daily Living Home Assistive Devices/Equipment: Eyeglasses ADL Screening (condition at time of admission) Patient's cognitive ability adequate to safely complete daily activities?: Yes Is the patient deaf or have difficulty hearing?: Yes Does the patient have difficulty seeing, even when wearing glasses/contacts?: No Does the patient have difficulty concentrating, remembering, or making decisions?: No Patient able to express need for assistance with ADLs?: Yes Does the patient have difficulty dressing or bathing?: Yes Independently performs ADLs?: No Communication: Needs assistance(sign language) Is this a change from baseline?: Pre-admission baseline Dressing (OT): Needs assistance Is this a change from baseline?: Pre-admission baseline Grooming: Needs assistance Is this a change from baseline?: Pre-admission baseline Feeding: Independent Bathing: Needs assistance Is this a change from baseline?: Pre-admission baseline Toileting: Needs assistance Is this a change from baseline?: Pre-admission baseline In/Out Bed: Needs assistance Is this a change from baseline?: Pre-admission baseline Walks in Home: Independent with device (comment) Does the patient have difficulty walking or climbing stairs?: Yes Weakness of Legs: Both Weakness of Arms/Hands: Both  Permission Sought/Granted Permission sought to share information with : Facility Art therapist granted to share information with : Yes, Verbal Permission Granted     Permission  granted to share info w AGENCY: [facility], [facility]        Emotional Assessment Appearance:: Appears stated age Attitude/Demeanor/Rapport: Engaged, Gracious Affect (typically observed): Accepting, Appropriate, Calm, Pleasant Orientation: : Oriented to Self, Oriented to Place, Oriented to  Time, Oriented to Situation Alcohol / Substance Use: Not Applicable Psych Involvement: No (comment)  Admission diagnosis:  Bacteremia [R78.81] SIRS (systemic inflammatory response syndrome) ([place]) [R65.10] Patient Active Problem List   Diagnosis Date Noted  . Group B streptococcal bacteriuria 06/18/2019  . AKI (acute kidney injury) ([place]) 06/18/2019  . Lactic acidosis 06/18/2019  . Pressure ulcer, stage 2 ([place]) 06/18/2019  . Bacteremia 06/17/2019  . SIRS (systemic inflammatory response syndrome) ([place]) 06/16/2019  . Leg swelling   . Decompensated hepatic cirrhosis ([place]) 05/22/2019  . Anasarca 05/19/2019  . CRI (chronic renal insufficiency), stage 3 (moderate) 01/30/2019  . Iron deficiency anemia due to chronic blood loss 11/02/2018  . Coronary artery disease of bypass graft of native heart with stable angina pectoris ([place]) 11/02/2018  . Pure hypercholesterolemia 11/02/2018  . Biventricular automatic implantable cardioverter defibrillator in situ 11/02/2018  . GIB (gastrointestinal bleeding) 09/14/2018  . Hypotension 08/14/2018  . Acute on chronic systolic CHF (congestive heart failure) ([place]) 08/12/2018  . Acute on chronic combined systolic and diastolic CHF (congestive heart failure) ([place])   . GI bleed 05/26/2018  . Occult GI bleeding 05/25/2018  . Normocytic anemia 05/25/2018  . Elevated troponin 05/25/2018  . Anemia   . Lymphedema 02/28/2018  . Weakness 07/15/2016  . Fatigue 07/15/2016  . CVA (cerebral infarction) 10/31/2015  . Bulbous urethral stricture 09/18/2015  . Bilateral deafness 08/19/2015  . Bilateral cataracts 08/19/2015  . Acid reflux 08/19/2015  . HLD  (hyperlipidemia) 08/19/2015  . Essential hypertension 08/19/2015  . Myocardial infarction ([place]) 08/19/2015  . Calculus of kidney 08/19/2015  . Artificial cardiac pacemaker 08/19/2015  . Gastroduodenal ulcer 08/19/2015  . Dupuytren's contracture of foot 08/19/2015  . Malignant neoplasm of prostate ([place]) 08/19/2015  . Microhematuria 08/19/2015  . CAD S/P multiple PCIs 02/22/2015  . Status post coronary artery bypass grafting 02/22/2015  . Benign essential HTN 04/02/2014  . Hematochezia 02/20/2014  . Tricuspid valve regurgitation, nonrheumatic 02/20/2014  . Mobitz type II atrioventricular block 12/12/2013  . Long term current use of anticoagulant 10/18/2013  . Cardiomyopathy, ischemic 11/11/2012  . CAD Status post coronary artery bypass grafting: 1995 11/11/2012  . Permanent atrial fibrillation ([place]) 11/11/2012  . Biventricular ICD in place 11/11/2012  . Chronic combined systolic and diastolic heart failure ([place]) 11/11/2012   PCP:  [name], MD [id number] [place] [id number]: [phone]: [phone]    Social Determinants of Health (SDOH) Interventions    Readmission Risk Interventions Readmission Risk Prevention Plan 06/23/2019 06/20/2019 05/26/2019  Transportation Screening Complete Complete Complete  PCP or Specialist Appt within 3-5 Days Complete - Complete  HRI or Home Care Consult Complete Complete Complete  Social Work Consult for RDysartPlanning/Counseling Complete - Complete  Palliative Care Screening Not Applicable Not Applicable Not Applicable  Medication Review (Press photographer Complete Complete Complete  Some recent data might be hidden

## 2019-06-23 NOTE — Treatment Plan (Signed)
Diagnosis: Group B streptococcus bacteremia Cardiac device Baseline Creatinine-1.03    Allergies  Allergen Reactions  . Entresto [Sacubitril-Valsartan] Swelling    And bruising of arm  . Phenazopyridine Nausea Only and Other (See Comments)    GI UPSET  . Ramipril Other (See Comments)    unk Other reaction(s): Other (See Comments), Unknown unk    OPAT Orders Discharge antibiotics: Ceftriaxone 2 grams IV every 24 hours for 4 weeks  End date 07/14/19  [facility] Care Per Protocol:  Labs weekly while on IV antibiotics: __X CBC with differential _X_ CMP    _X_ Please pull PIC at completion of IV antibiotics _  Fax weekly labs to [place] [phone]  Clinic Follow Up Appt:in 3 weeks   Call [phone] to make appt

## 2019-06-23 NOTE — Progress Notes (Signed)
Peripherally Inserted Central Catheter Placement  The IV Nurse has discussed with the patient and/or persons authorized to consent for the patient, the purpose of this procedure and the potential benefits and risks involved with this procedure.  The benefits include less needle sticks, lab draws from the catheter, and the patient may be discharged home with the catheter. Risks include, but not limited to, infection, bleeding, blood clot (thrombus formation), and puncture of an artery; nerve damage and irregular heartbeat and possibility to perform a PICC exchange if needed/ordered by physician.  Alternatives to this procedure were also discussed.  Bard Power PICC patient education guide, fact sheet on infection prevention and patient information card has been provided to patient /or left at bedside.    PICC Placement Documentation  PICC Single Lumen [id number] PICC Right Basilic 44 cm 0 cm (Active)  Indication for Insertion or Continuance of Line Home intravenous therapies (PICC only);Prolonged intravenous therapies 06/23/19 1200  Exposed Catheter (cm) 0 cm 06/23/19 1200  Site Assessment Clean;Dry;Intact 06/23/19 1200  Line Status Flushed;Saline locked;Blood return noted 06/23/19 1200  Dressing Type Transparent;Securing device 06/23/19 1200  Dressing Status Clean;Dry;Intact;Antimicrobial disc in place 06/23/19 1200  Dressing Intervention New dressing 06/23/19 1200  Dressing Change Due 06/30/19 06/23/19 1200    Consent signed by wife, Lawanna Kobus, with [place]/interpreter, verified by Primary RN Jearld Lesch.   Enos Fling 06/23/2019, 12:13 PM

## 2019-06-23 NOTE — Progress Notes (Signed)
Spoke with Dr. Golden Circle re: CXR for S/P PICC placement, ok to used R SL PICC. Primary RN made aware.

## 2019-06-23 NOTE — Progress Notes (Signed)
Progress Note  Patient Name: Thomas [name] Date of Encounter: 06/23/2019  Primary Cardiologist: Sanda Klein, MD   Subjective   Tolerated transesophageal echo yesterday no complications No clear sign of vegetation on heart valves or pacer wires PFO noted Significant TR and dilated RV, RA consistent with elevated right heart pressures  Discussed with interpreter this morning, This morning continues to have severe lower extremity edema pitting to below the knees Scheduled to have PICC line placed for home antibiotics Wife at the bedside, discussed strategies for compression of his lower extremities when home   Inpatient Medications    Scheduled Meds: . furosemide  40 mg Intravenous BID  . pantoprazole  40 mg Oral Daily  . potassium chloride  40 mEq Oral BID  . simvastatin  20 mg Oral Daily   Continuous Infusions: . sodium chloride 10 mL/hr at 06/20/19 1609  . cefTRIAXone (ROCEPHIN)  IV 2 g (06/22/19 2151)   PRN Meds: sodium chloride, acetaminophen, albuterol   Vital Signs    Vitals:   06/22/19 1219 06/22/19 2112 06/23/19 0501 06/23/19 1003  BP:  118/63 116/66 110/63  Pulse: 61 65 64 71  Resp: (!) 24 20 20    Temp: (!) 97.4 F (36.3 C) 97.9 F (36.6 C) 98.3 F (36.8 C)   TempSrc:  Oral Oral   SpO2: 96% 90% 95% 97%  Weight:      Height:        Intake/Output Summary (Last 24 hours) at 06/23/2019 1153 Last data filed at 06/23/2019 1033 Gross per 24 hour  Intake 0 ml  Output 1100 ml  Net -1100 ml   Last 3 Weights 06/23/2019 06/22/2019 06/19/2019  Weight (lbs) (No Data) 228 lb 9.9 oz 228 lb 6.3 oz  Weight (kg) (No Data) 103.7 kg 103.6 kg      Telemetry    Paced rhythm- Personally Reviewed  ECG     - Personally Reviewed  Physical Exam   GEN: No acute distress.   Neck: No JVD Cardiac: RRR, 2-3/6 SEM LSB, rubs, or gallops.  Respiratory: Clear to auscultation bilaterally, Rales at the bases GI: Soft, nontender, non-distended  MS: 2+ LE edema; No  deformity. Neuro:  Nonfocal  Psych: Normal affect   Labs    High Sensitivity Troponin:  No results for input(s): TROPONINIHS in the last 720 hours.    Chemistry Recent Labs  Lab 06/16/19 2052 06/16/19 2052 06/17/19 0321 06/18/19 0444 06/21/19 0610 06/22/19 0436 06/23/19 0546  NA 140   < > 139   < > 138 139 139  K 3.3*   < > 3.3*   < > 3.6 3.6 4.0  CL 102   < > 104   < > 105 105 104  CO2 26   < > 26   < > 27 26 27   GLUCOSE 118*   < > 127*   < > 110* 110* 105*  BUN 32*   < > 36*   < > 26* 20 19  CREATININE 1.76*   < > 1.57*   < > 1.10 0.99 1.03  CALCIUM 8.1*   < > 7.4*   < > 7.8* 7.7* 7.8*  PROT 5.2*  --  4.4*  --   --   --   --   ALBUMIN 2.1*  --  1.7*  --   --   --   --   AST 51*  --  44*  --   --   --   --  ALT 17  --  15  --   --   --   --   ALKPHOS 100  --  80  --   --   --   --   BILITOT 1.1  --  1.1  --   --   --   --   GFRNONAA 35*   < > 40*   < > >60 >60 >60  GFRAA 41*   < > 47*   < > >60 >60 >60  ANIONGAP 12   < > 9   < > 6 8 8    < > = values in this interval not displayed.     Hematology Recent Labs  Lab 06/21/19 0610 06/22/19 0436 06/23/19 0546  WBC 9.3 8.4 7.5  RBC 3.42* 3.27* 3.47*  HGB 8.0* 7.6* 8.1*  HCT 27.2* 26.3* 27.8*  MCV 79.5* 80.4 80.1  MCH 23.4* 23.2* 23.3*  MCHC 29.4* 28.9* 29.1*  RDW 19.9* 20.2* 20.3*  PLT 222 232 305    BNP Recent Labs  Lab 06/16/19 2052  BNP 1,390.0*     DDimer No results for input(s): DDIMER in the last 168 hours.   Radiology    US Venous Img Upper Uni Left (DVT)  Result Date: 06/23/2019 CLINICAL DATA:  Left upper extremity pain and edema. EXAM: LEFT UPPER EXTREMITY VENOUS DOPPLER ULTRASOUND TECHNIQUE: Gray-scale sonography with graded compression, as well as color Doppler and duplex ultrasound were performed to evaluate the upper extremity deep venous system from the level of the subclavian vein and including the jugular, axillary, basilic, radial, ulnar and upper cephalic vein. Spectral Doppler was  utilized to evaluate flow at rest and with distal augmentation maneuvers. COMPARISON:  None. FINDINGS: Contralateral Subclavian Vein: Respiratory phasicity is normal and symmetric with the symptomatic side. No evidence of thrombus. Normal compressibility. Internal Jugular Vein: No evidence of thrombus. Normal compressibility, respiratory phasicity and response to augmentation. Subclavian Vein: No evidence of thrombus. Normal compressibility, respiratory phasicity and response to augmentation. Axillary Vein: No evidence of thrombus. Normal compressibility, respiratory phasicity and response to augmentation. Cephalic Vein: No evidence of thrombus. Normal compressibility, respiratory phasicity and response to augmentation. Basilic Vein: No evidence of thrombus. Normal compressibility, respiratory phasicity and response to augmentation. Brachial Veins: No evidence of thrombus. Normal compressibility, respiratory phasicity and response to augmentation. Radial Veins: No evidence of thrombus. Normal compressibility, respiratory phasicity and response to augmentation. Ulnar Veins: No evidence of thrombus. Normal compressibility, respiratory phasicity and response to augmentation. Venous Reflux:  None visualized. Other Findings: No evidence of superficial thrombophlebitis or abnormal fluid collection. IMPRESSION: No evidence of DVT within the left upper extremity. Electronically Signed   By: Aletta Edouard M.D.   On: 06/23/2019 09:19   [place] EKG SITE RITE  Result Date: 06/22/2019 If Site Rite image not attached, placement could not be confirmed due to current cardiac rhythm.   Cardiac Studies   Echocardiogram ejection fraction 40%, severely dilated left atrium, dilated pulmonary artery, right heart pressures 60 mmHg  Patient Profile     85 y.o. male 85 year old gentleman with history of CAD status post CABG, PCI, heart failure reduced ejection fraction last EF 35 to 40%, status post ICD 2014, permanent A. fib not  on anticoagulation due to GI bleed, deafness who is being seen for group B strep bacteremia  Assessment & Plan    1.  Group B strep bacteremia 2/4 blood cultures positive for group B streptococcus. Fever on arrival to the hospital Transesophageal echo yesterday  No clear vegetation noted We will defer to ID  2. HFrEF EF 35-40%/pulmonary hypertension Significant tricuspid valve regurgitation on echo concerning for elevated right heart pressures -Massive pitting lower extremity edema on exam -Restarted on Lasix IV twice daily yesterday -Discussed that he could use Ace wraps for now as unable to get compression hose on given the extent of his swelling -Recommended leg elevation when sitting in his recliner at home -Low blood pressure limiting advancement of his heart failure medications.  He has had bradycardia, metoprolol has been held.  --We will add losartan 12.5 mg daily Renal function improved, likely ATN in the setting of infection -If blood pressure tolerates could restart outpatient low-dose isosorbide , was on 30 mg, could try 15 mg  3.  History of CAD status post CABG, PCI -Currently with no chest pain On statin,  No further ischemic work-up at this time  4.  Permanent A. Fib -Heart rate well controlled,  paced rhythm. -Not on anticoagulation due to history of GI bleeds, known AVM. Spontaneous contrast noted in the left atrium, unable to exclude small left atrial thrombus (not well-visualized) --Would reconsider anticoagulation once hemoglobin 9-10 range Metoprolol held for bradycardia  5.  GI bleed Black stools for 1 month on arrival to the hospital Dysphagia EGD last year showing hiatal hernia Previous diagnosis small bowel AVMs, causing GI bleeds CT scan possible cirrhosis -Currently not on anticoagulation for atrial fibrillation  Long discussion with wife at the bedside, patient, interpreter assisting with sign language  Total encounter time more than 45  minutes  Greater than 50% was spent in counseling and coordination of care with the patient  For questions or updates, please contact [place] Please consult [email or web address] for contact info under        Signed, Ida Rogue, MD  06/23/2019, 11:53 AM

## 2019-06-23 NOTE — Progress Notes (Signed)
PT Cancellation Note  Patient Details Name: Thomas [name] MRN: [id number] DOB: [birth year]   Cancelled Treatment:    Reason Eval/Treat Not Completed: Other (comment)   Pt in bed.  Assisted with urinal.  Pt had PICC line placed today.  Stated MD told him to rest his arm today.  While no orders noted, pt declined session and requested tomorrow.  Will continue as appropriate.    [name] in attendance for session.     [name] 06/23/2019, 1:00 PM

## 2019-06-24 DIAGNOSIS — M7989 Other specified soft tissue disorders: Secondary | ICD-10-CM

## 2019-06-24 LAB — CBC
HCT: 26.7 % — ABNORMAL LOW (ref 39.0–52.0)
Hemoglobin: 8 g/dL — ABNORMAL LOW (ref 13.0–17.0)
MCH: 23.5 pg — ABNORMAL LOW (ref 26.0–34.0)
MCHC: 30 g/dL (ref 30.0–36.0)
MCV: 78.5 fL — ABNORMAL LOW (ref 80.0–100.0)
Platelets: 294 10*3/uL (ref 150–400)
RBC: 3.4 MIL/uL — ABNORMAL LOW (ref 4.22–5.81)
RDW: 20.5 % — ABNORMAL HIGH (ref 11.5–15.5)
WBC: 7.1 10*3/uL (ref 4.0–10.5)
nRBC: 0 % (ref 0.0–0.2)

## 2019-06-24 LAB — BASIC METABOLIC PANEL
Anion gap: 7 (ref 5–15)
BUN: 20 mg/dL (ref 8–23)
CO2: 26 mmol/L (ref 22–32)
Calcium: 7.9 mg/dL — ABNORMAL LOW (ref 8.9–10.3)
Chloride: 108 mmol/L (ref 98–111)
Creatinine, Ser: 1.34 mg/dL — ABNORMAL HIGH (ref 0.61–1.24)
GFR calc Af Amer: 57 mL/min — ABNORMAL LOW (ref >60)
GFR calc non Af Amer: 49 mL/min — ABNORMAL LOW (ref >60)
Glucose, Bld: 81 mg/dL (ref 70–99)
Potassium: 4.7 mmol/L (ref 3.5–5.1)
Sodium: 141 mmol/L (ref 135–145)

## 2019-06-24 MED ORDER — TORSEMIDE 20 MG PO TABS
40.0000 mg | ORAL_TABLET | Freq: Every day | ORAL | Status: DC
  Filled 2019-06-24: qty 2

## 2019-06-24 MED ORDER — TORSEMIDE 20 MG PO TABS
40.0000 mg | ORAL_TABLET | Freq: Two times a day (BID) | ORAL | Status: DC
  Administered 2019-06-24 – 2019-06-25 (×2): 40 mg via ORAL
  Filled 2019-06-24 (×3): qty 2

## 2019-06-24 NOTE — Progress Notes (Signed)
PROGRESS NOTE    Thomas [name]  [id number] DOB: [birth year] DOA: 06/16/2019 PCP: [name], MD      Brief Narrative:  Thomas [name] is a 85 y.o. M with deafness, sCHF EF 35-40%, chronic anasarca, CAD, BiV ICD/pacemaker, HTN, chronic A. fib, possible cirrhosis, and history of GI bleed due to AVMs who presented with chills, generalized malaise, and vomiting for several days.  Heart, chest x-ray unremarkable, antibiotics were started empirically.  Culture subsequently turned positive for group B strep.         Assessment & Plan:  Sepsis secondary to Group B strep bacteremia Patient presented with leukocytosis, fever, AKI, elevated lactic acid.  Started on empiric antibiotics.  Blood cultures growing group B strep.  TEE 4/8 unremarkable.  ID consulted, recommended 4 weeks antibiotics. PICC in place. -Continue antibiotics, day 9 of 28 -Agree with transition to ceftriaxone in order to reduce sodium load -Consult infectious disease   Acute on chronic systolic CHF Diuretics had mostly been held, use very cautiously until now.  On echocardiogram today, noted dilated right ventricle, elevated right heart pressures, suggesting significant fluid overload  Net negative only 350 cc yesterday, potassium stable, Cr slightly up -Continue IV Lasix twice daily -Continue potassium supplement -Daily BMP -Applying Unna boots -Strict ins and outs  Hypertension Coronary disease -Continue simvastatin -Hold home Imdur  Chronic atrial fibrillation Not on rate control agents, heart rate controlled -Hold anticoagulation due to severe anemia, eventually will resume  Possible cirrhosis   Chronic anemia, normocytic  Hemoglobin stable, no clinical bleeding -Continue iron  Pressure injury, stage II, buttocks, present on arrival   Severe protein calorie malnutrition As evidenced by severe depletion of subcutaneous muscle mass, chronic illness, poor oral intake, and pressure  injury.         Disposition:    Status is: Inpatient  Remains inpatient appropriate because:Inpatient level of care appropriate due to severity of illness   Dispo: The patient is from: Home              Anticipated d/c is to: Home              Anticipated d/c date is: 1 day              Patient currently is not medically stable to d/c.           MDM: The below labs and imaging reports reviewed and summarized above.  Medication management as above.     DVT prophylaxis: SCDs Code Status: Full code Family Communication: Attempted call to wife, no answer   Consultants:   Cardiology  Infectious disease  Procedures:   4/5 TTE  4/8 TEE  Antimicrobials:   Vancomycin and Zosyn 4/2-4/4  Penicillin 4/4>>4/8  Ceftriaxone 4/8 >>   Culture data:   4/2 blood culture x2-group B strep  4/2 Urine culture no growth  4/4 blood culture x2 -- no growth          Subjective: The patient has no complaints.  All history collected through video phonic ASL interpreter.  No chest pain, orthopnea, dyspnea on exertion.  He feels at his baseline.  He still has severe swelling of bilateral legs in the left arm.  Objective: Vitals:   06/24/19 0344 06/24/19 0523 06/24/19 0922 06/24/19 1427  BP: (!) 118/56  (!) 121/54 113/61  Pulse: 77  68 61  Resp: 20  18 18   Temp: 97.9 F (36.6 C)  97.8 F (36.6 C) 97.9 F (36.6 C)  TempSrc: Oral  Oral Oral  SpO2: 94%  95% 96%  Weight:  101.7 kg    Height:        Intake/Output Summary (Last 24 hours) at 06/24/2019 1602 Last data filed at 06/24/2019 1423 Gross per 24 hour  Intake 406.15 ml  Output 350 ml  Net 56.15 ml   Filed Weights   06/19/19 0437 06/22/19 0255 06/24/19 0523  Weight: 103.6 kg 103.7 kg 101.7 kg    Examination: General appearance: Elderly adult male, alert and in no acute distress.  Sitting in recliner, sleeping, easily arousable HEENT: Anicteric, conjunctiva pink, lids and lashes normal. No  nasal deformity, discharge, epistaxis.  Lips moist, dentition in poor repair, oropharynx moist, no oral lesions.  Deaf. Skin: Warm and dry.  No suspicious rashes or lesions. Cardiac: RRR, systolic murmur noted, JVP elevated, marked lower extremity edema.    Respiratory: Normal respiratory rate and rhythm.  CTAB without rales or wheezes. Abdomen: Abdomen soft.  No tenderness palpation or guarding. No ascites, distension, hepatosplenomegaly.   MSK: No deformities or effusions of the large joints of the upper or lower extremities bilaterally.  Diffuse severe loss of subcutaneous muscle mass and fat Neuro: sleeping but easily arousable and alert.  Moves all extremities with generalized weakness, but symmetric strength.  Speech dysarthric.   Psych: Sensorium intact and responding to questions, attention normal. Affect normal.  Judgment and insight appear normal.      Data Reviewed: I have personally reviewed following labs and imaging studies:  CBC: Recent Labs  Lab 06/20/19 0544 06/21/19 0610 06/22/19 0436 06/23/19 0546 06/24/19 0642  WBC 8.5 9.3 8.4 7.5 7.1  HGB 8.0* 8.0* 7.6* 8.1* 8.0*  HCT 27.8* 27.2* 26.3* 27.8* 26.7*  MCV 80.3 79.5* 80.4 80.1 78.5*  PLT 203 222 232 305 [id number]   Basic Metabolic Panel: Recent Labs  Lab 06/19/19 0534 06/19/19 0534 06/20/19 0544 06/21/19 0610 06/22/19 0436 06/23/19 0546 06/24/19 0642  NA 140   < > 142 138 139 139 141  K 3.7   < > 3.3* 3.6 3.6 4.0 4.7  CL 107   < > 106 105 105 104 108  CO2 29   < > 28 27 26 27 26   GLUCOSE 114*   < > 98 110* 110* 105* 81  BUN 39*   < > 31* 26* 20 19 20   CREATININE 1.33*   < > 1.14 1.10 0.99 1.03 1.34*  CALCIUM 7.8*   < > 7.7* 7.8* 7.7* 7.8* 7.9*  MG 2.0  --  1.9 2.1 1.9 2.0  --    < > = values in this interval not displayed.   GFR: Estimated Creatinine Clearance: 54.1 mL/min (A) (by C-G formula based on SCr of 1.34 mg/dL (H)). Liver Function Tests: No results for input(s): AST, ALT, ALKPHOS, BILITOT,  PROT, ALBUMIN in the last 168 hours. No results for input(s): LIPASE, AMYLASE in the last 168 hours. No results for input(s): AMMONIA in the last 168 hours. Coagulation Profile: No results for input(s): INR, PROTIME in the last 168 hours. Cardiac Enzymes: No results for input(s): CKTOTAL, CKMB, CKMBINDEX, TROPONINI in the last 168 hours. BNP (last 3 results) No results for input(s): PROBNP in the last 8760 hours. HbA1C: No results for input(s): HGBA1C in the last 72 hours. CBG: No results for input(s): GLUCAP in the last 168 hours. Lipid Profile: No results for input(s): CHOL, HDL, LDLCALC, TRIG, CHOLHDL, LDLDIRECT in the last 72 hours. Thyroid Function Tests: No results for input(s):  TSH, T4TOTAL, FREET4, T3FREE, THYROIDAB in the last 72 hours. Anemia Panel: No results for input(s): VITAMINB12, FOLATE, FERRITIN, TIBC, IRON, RETICCTPCT in the last 72 hours. Urine analysis:    Component Value Date/Time   COLORURINE YELLOW (A) 06/16/2019 2304   APPEARANCEUR HAZY (A) 06/16/2019 2304   APPEARANCEUR Clear 12/13/2017 0918   LABSPEC 1.014 06/16/2019 2304   LABSPEC 1.021 11/19/2011 1501   PHURINE 5.0 06/16/2019 2304   GLUCOSEU NEGATIVE 06/16/2019 2304   GLUCOSEU Negative 11/19/2011 1501   HGBUR NEGATIVE 06/16/2019 2304   BILIRUBINUR NEGATIVE 06/16/2019 2304   BILIRUBINUR Negative 12/13/2017 0918   BILIRUBINUR Negative 11/19/2011 1501   KETONESUR NEGATIVE 06/16/2019 2304   PROTEINUR 30 (A) 06/16/2019 2304   NITRITE NEGATIVE 06/16/2019 2304   LEUKOCYTESUR NEGATIVE 06/16/2019 2304   LEUKOCYTESUR Negative 11/19/2011 1501   Sepsis Labs: @LABRCNTIP (procalcitonin:4,lacticacidven:4)  ) Recent Results (from the past 240 hour(s))  C Difficile Quick Screen w PCR reflex     Status: None   Collection Time: 06/16/19  2:06 AM   Specimen: STOOL  Result Value Ref Range Status   C Diff antigen NEGATIVE NEGATIVE Final   C Diff toxin NEGATIVE NEGATIVE Final   C Diff interpretation No C.  difficile detected.  Final    Comment: Performed at [facility], [address] [place], [place] [address]  Gastrointestinal Panel by PCR , Stool     Status: None   Collection Time: 06/16/19  2:06 AM  Result Value Ref Range Status   Campylobacter species NOT DETECTED NOT DETECTED Final   Plesimonas shigelloides NOT DETECTED NOT DETECTED Final   Salmonella species NOT DETECTED NOT DETECTED Final   Yersinia enterocolitica NOT DETECTED NOT DETECTED Final   Vibrio species NOT DETECTED NOT DETECTED Final   Vibrio cholerae NOT DETECTED NOT DETECTED Final   Enteroaggregative E coli (EAEC) NOT DETECTED NOT DETECTED Final   Enteropathogenic E coli (EPEC) NOT DETECTED NOT DETECTED Final   Enterotoxigenic E coli (ETEC) NOT DETECTED NOT DETECTED Final   Shiga like toxin producing E coli (STEC) NOT DETECTED NOT DETECTED Final   Shigella/Enteroinvasive E coli (EIEC) NOT DETECTED NOT DETECTED Final   Cryptosporidium NOT DETECTED NOT DETECTED Final   Cyclospora cayetanensis NOT DETECTED NOT DETECTED Final   Entamoeba histolytica NOT DETECTED NOT DETECTED Final   Giardia lamblia NOT DETECTED NOT DETECTED Final   Adenovirus F40/41 NOT DETECTED NOT DETECTED Final   Astrovirus NOT DETECTED NOT DETECTED Final   Norovirus GI/GII NOT DETECTED NOT DETECTED Final   Rotavirus A NOT DETECTED NOT DETECTED Final   Sapovirus (I, II, IV, and V) NOT DETECTED NOT DETECTED Final    Comment: Performed at [facility], [address] [place], [place] [address]  Blood Culture (routine x 2)     Status: Abnormal   Collection Time: 06/16/19  8:52 PM   Specimen: BLOOD  Result Value Ref Range Status   Specimen Description   Final    BLOOD LEFT FA Performed at [facility], [address], [place], [place] [address]    Special Requests   Final    BOTTLES DRAWN AEROBIC AND ANAEROBIC Blood Culture adequate volume Performed at [facility], [address] [place], [place]  [address]    Culture  Setup Time   Final    IN BOTH AEROBIC AND ANAEROBIC BOTTLES GRAM POSITIVE COCCI IN PAIRS CRITICAL RESULT CALLED TO, READ BACK BY AND VERIFIED WITH: SCOTT HALL ON 06/17/19 AT [facility]  Culture (A)  Final    GROUP B STREP(S.AGALACTIAE)ISOLATED SUSCEPTIBILITIES PERFORMED ON PREVIOUS CULTURE WITHIN THE LAST 5 DAYS. Performed at [facility], [place] [address], [place], [place] [address]    Report Status 06/19/2019 FINAL  Final  Respiratory Panel by RT PCR (Flu A&B, Covid) - Nasopharyngeal Swab     Status: None   Collection Time: 06/16/19 10:02 PM   Specimen: Nasopharyngeal Swab  Result Value Ref Range Status   SARS Coronavirus 2 by RT PCR NEGATIVE NEGATIVE Final    Comment: (NOTE) SARS-CoV-2 target nucleic acids are NOT DETECTED. The SARS-CoV-2 RNA is generally detectable in upper respiratoy specimens during the acute phase of infection. The lowest concentration of SARS-CoV-2 viral copies this assay can detect is 131 copies/mL. A negative result does not preclude SARS-Cov-2 infection and should not be used as the sole basis for treatment or other patient management decisions. A negative result may occur with  improper specimen collection/handling, submission of specimen other than nasopharyngeal swab, presence of viral mutation(s) within the areas targeted by this assay, and inadequate number of viral copies (<131 copies/mL). A negative result must be combined with clinical observations, patient history, and epidemiological information. The expected result is Negative. Fact Sheet for Patients:  PinkCheek.be Fact Sheet for Healthcare Providers:  GravelBags.it This test is not yet ap proved or cleared by the [place] FDA and  has been authorized for detection and/or diagnosis of SARS-CoV-2 by FDA under an Emergency Use Authorization (EUA). This EUA will remain  in effect (meaning this test can be used)  for the duration of the COVID-19 declaration under Section 564(b)(1) of the Act, 21 U.S.C. section 360bbb-3(b)(1), unless the authorization is terminated or revoked sooner.    Influenza A by PCR NEGATIVE NEGATIVE Final   Influenza B by PCR NEGATIVE NEGATIVE Final    Comment: (NOTE) The Xpert Xpress SARS-CoV-2/FLU/RSV assay is intended as an aid in  the diagnosis of influenza from Nasopharyngeal swab specimens and  should not be used as a sole basis for treatment. Nasal washings and  aspirates are unacceptable for Xpert Xpress SARS-CoV-2/FLU/RSV  testing. Fact Sheet for Patients: PinkCheek.be Fact Sheet for Healthcare Providers: GravelBags.it This test is not yet approved or cleared by the [place] FDA and  has been authorized for detection and/or diagnosis of SARS-CoV-2 by  FDA under an Emergency Use Authorization (EUA). This EUA will remain  in effect (meaning this test can be used) for the duration of the  Covid-19 declaration under Section 564(b)(1) of the Act, 21  U.S.C. section 360bbb-3(b)(1), unless the authorization is  terminated or revoked. Performed at [facility], [address] [place], [place] [address]   Blood Culture (routine x 2)     Status: Abnormal   Collection Time: 06/16/19 10:03 PM   Specimen: BLOOD  Result Value Ref Range Status   Specimen Description   Final    BLOOD LEFT FA Performed at [facility], [address], [place], [place] [address]    Special Requests   Final    BOTTLES DRAWN AEROBIC AND ANAEROBIC Blood Culture adequate volume Performed at [facility], [address] [place], [place] [address]    Culture  Setup Time   Final    Organism ID to follow IN BOTH AEROBIC AND ANAEROBIC BOTTLES GRAM POSITIVE COCCI IN PAIRS CRITICAL VALUE NOTED.  VALUE IS CONSISTENT WITH PREVIOUSLY REPORTED AND CALLED VALUE. Performed at [facility], [address]  [place],  [place] [address]    Culture GROUP B STREP(S.AGALACTIAE)ISOLATED (A)  Final   Report Status 06/19/2019 FINAL  Final   Organism ID, Bacteria GROUP B STREP(S.AGALACTIAE)ISOLATED  Final      Susceptibility   Group b strep(s.agalactiae)isolated - MIC*    CLINDAMYCIN >=1 RESISTANT Resistant     AMPICILLIN <=0.25 SENSITIVE Sensitive     ERYTHROMYCIN >=8 RESISTANT Resistant     VANCOMYCIN 0.5 SENSITIVE Sensitive     CEFTRIAXONE <=0.12 SENSITIVE Sensitive     LEVOFLOXACIN 1 SENSITIVE Sensitive     * GROUP B STREP(S.AGALACTIAE)ISOLATED  Blood Culture ID Panel (Reflexed)     Status: Abnormal   Collection Time: 06/16/19 10:03 PM  Result Value Ref Range Status   Enterococcus species NOT DETECTED NOT DETECTED Final   Listeria monocytogenes NOT DETECTED NOT DETECTED Final   Staphylococcus species NOT DETECTED NOT DETECTED Final   Staphylococcus aureus (BCID) NOT DETECTED NOT DETECTED Final   Streptococcus species DETECTED (A) NOT DETECTED Final    Comment: CRITICAL RESULT CALLED TO, READ BACK BY AND VERIFIED WITH: SCOTT HALL ON 06/17/19 AT 0608 [facility]    Streptococcus agalactiae DETECTED (A) NOT DETECTED Final    Comment: CRITICAL RESULT CALLED TO, READ BACK BY AND VERIFIED WITH: SCOTT HALL ON 06/17/19 AT 0608 [facility]    Streptococcus pneumoniae NOT DETECTED NOT DETECTED Final   Streptococcus pyogenes NOT DETECTED NOT DETECTED Final   Acinetobacter baumannii NOT DETECTED NOT DETECTED Final   Enterobacteriaceae species NOT DETECTED NOT DETECTED Final   Enterobacter cloacae complex NOT DETECTED NOT DETECTED Final   Escherichia coli NOT DETECTED NOT DETECTED Final   Klebsiella oxytoca NOT DETECTED NOT DETECTED Final   Klebsiella pneumoniae NOT DETECTED NOT DETECTED Final   Proteus species NOT DETECTED NOT DETECTED Final   Serratia marcescens NOT DETECTED NOT DETECTED Final   Haemophilus influenzae NOT DETECTED NOT DETECTED Final   Neisseria meningitidis NOT DETECTED NOT DETECTED Final    Pseudomonas aeruginosa NOT DETECTED NOT DETECTED Final   Candida albicans NOT DETECTED NOT DETECTED Final   Candida glabrata NOT DETECTED NOT DETECTED Final   Candida krusei NOT DETECTED NOT DETECTED Final   Candida parapsilosis NOT DETECTED NOT DETECTED Final   Candida tropicalis NOT DETECTED NOT DETECTED Final    Comment: Performed at [facility], [address], [place], [place] [address]  Urine culture     Status: None   Collection Time: 06/16/19 11:04 PM   Specimen: In/Out Cath Urine  Result Value Ref Range Status   Specimen Description   Final    IN/OUT CATH URINE Performed at [facility], [address] [place], [place] [address]    Special Requests   Final    NONE Performed at [facility], [address] [place], [place] [address]    Culture   Final    NO GROWTH Performed at [facility], [place] [address], [place], [place] [address]    Report Status 06/18/2019 FINAL  Final  Culture, blood (Routine X 2) w Reflex to ID Panel     Status: None (Preliminary result)   Collection Time: 06/18/19  5:07 PM   Specimen: BLOOD  Result Value Ref Range Status   Specimen Description BLOOD BLOOD LEFT HAND  Final   Special Requests   Final    BOTTLES DRAWN AEROBIC AND ANAEROBIC Blood Culture adequate volume   Culture   Final    NO GROWTH 4 DAYS Performed at [facility], [address] [place], [place] [address]    Report Status PENDING  Incomplete  Culture, blood (Routine X 2) w Reflex to ID Panel     Status: None (Preliminary result)   Collection Time: 06/18/19  5:07 PM   Specimen: BLOOD  Result Value Ref Range Status   Specimen Description BLOOD BLOOD RIGHT HAND  Final   Special Requests AEROBIC BOTTLE ONLY Blood Culture adequate volume  Final   Culture   Final    NO GROWTH 4 DAYS Performed at [facility], [address], [place], [place] [address]    Report Status PENDING  Incomplete         Radiology  Studies: US Venous Img Upper Uni Left (DVT)  Result Date: 06/23/2019 CLINICAL DATA:  Left upper extremity pain and edema. EXAM: LEFT UPPER EXTREMITY VENOUS DOPPLER ULTRASOUND TECHNIQUE: Gray-scale sonography with graded compression, as well as color Doppler and duplex ultrasound were performed to evaluate the upper extremity deep venous system from the level of the subclavian vein and including the jugular, axillary, basilic, radial, ulnar and upper cephalic vein. Spectral Doppler was utilized to evaluate flow at rest and with distal augmentation maneuvers. COMPARISON:  None. FINDINGS: Contralateral Subclavian Vein: Respiratory phasicity is normal and symmetric with the symptomatic side. No evidence of thrombus. Normal compressibility. Internal Jugular Vein: No evidence of thrombus. Normal compressibility, respiratory phasicity and response to augmentation. Subclavian Vein: No evidence of thrombus. Normal compressibility, respiratory phasicity and response to augmentation. Axillary Vein: No evidence of thrombus. Normal compressibility, respiratory phasicity and response to augmentation. Cephalic Vein: No evidence of thrombus. Normal compressibility, respiratory phasicity and response to augmentation. Basilic Vein: No evidence of thrombus. Normal compressibility, respiratory phasicity and response to augmentation. Brachial Veins: No evidence of thrombus. Normal compressibility, respiratory phasicity and response to augmentation. Radial Veins: No evidence of thrombus. Normal compressibility, respiratory phasicity and response to augmentation. Ulnar Veins: No evidence of thrombus. Normal compressibility, respiratory phasicity and response to augmentation. Venous Reflux:  None visualized. Other Findings: No evidence of superficial thrombophlebitis or abnormal fluid collection. IMPRESSION: No evidence of DVT within the left upper extremity. Electronically Signed   By: Aletta Edouard M.D.   On: 06/23/2019 09:19   DG  Chest Port 1 View  Result Date: 06/23/2019 CLINICAL DATA:  Status post PICC line placement EXAM: PORTABLE CHEST 1 VIEW COMPARISON:  06/16/2019 FINDINGS: Cardiac shadow is enlarged but stable. Aortic calcifications are seen. Defibrillator is again noted and stable. Stable left basilar atelectasis is seen. Vascular congestion is noted. PICC line is now seen with the tip superimposed over the superior aspect of the right atrium. No other focal abnormality is noted. IMPRESSION: PICC line in the superior right atrium. Stable left basilar atelectasis and vascular congestion. Electronically Signed   By: Inez Catalina M.D.   On: 06/23/2019 12:42        Scheduled Meds: . Chlorhexidine Gluconate Cloth  6 each Topical Daily  . ferrous sulfate  325 mg Oral Q breakfast  . losartan  12.5 mg Oral Daily  . pantoprazole  40 mg Oral Daily  . potassium chloride  40 mEq Oral BID  . simvastatin  20 mg Oral Daily  . torsemide  40 mg Oral BID   Continuous Infusions: . sodium chloride Stopped (06/23/19 1705)  . cefTRIAXone (ROCEPHIN)  IV 2 g (06/23/19 1705)     LOS: 8 days    Time spent: 25 minutes    Edwin Dada, MD [facility] 06/24/2019, 4:02 PM  Please page though [employer] or Epic secure chat:  For [employer], Adult nurse

## 2019-06-24 NOTE — Progress Notes (Signed)
Progress Note  Patient Name: Thomas [name] Date of Encounter: 06/24/2019  Primary Cardiologist: Sanda Klein, MD   Subjective   No acute events over the past 24 hours.  No fevers or chills noted.  PICC line in the right arm placed yesterday.  Prolonged course of antibiotics being planned.  Creatinine slightly worse.  Inpatient Medications    Scheduled Meds: . Chlorhexidine Gluconate Cloth  6 each Topical Daily  . ferrous sulfate  325 mg Oral Q breakfast  . losartan  12.5 mg Oral Daily  . pantoprazole  40 mg Oral Daily  . potassium chloride  40 mEq Oral BID  . simvastatin  20 mg Oral Daily  . torsemide  40 mg Oral BID   Continuous Infusions: . sodium chloride Stopped (06/23/19 1705)  . cefTRIAXone (ROCEPHIN)  IV 2 g (06/23/19 1705)   PRN Meds: sodium chloride, acetaminophen, albuterol, sodium chloride flush   Vital Signs    Vitals:   06/23/19 2045 06/24/19 0344 06/24/19 0523 06/24/19 0922  BP: 117/62 (!) 118/56  (!) 121/54  Pulse: 71 77  68  Resp: 20 20  18   Temp: 98.2 F (36.8 C) 97.9 F (36.6 C)  97.8 F (36.6 C)  TempSrc: Oral Oral  Oral  SpO2: 92% 94%  95%  Weight:   101.7 kg   Height:        Intake/Output Summary (Last 24 hours) at 06/24/2019 1233 Last data filed at 06/24/2019 1038 Gross per 24 hour  Intake 646.15 ml  Output 700 ml  Net -53.85 ml   Last 3 Weights 06/24/2019 06/23/2019 06/22/2019  Weight (lbs) 224 lb 3.3 oz (No Data) 228 lb 9.9 oz  Weight (kg) 101.7 kg (No Data) 103.7 kg      Telemetry    Currently off telemetry.  ECG    No new tracing observed- Personally Reviewed  Physical Exam   GEN: No acute distress.   Neck:  No JVD noted Cardiac:  Regular rhythm, systolic murmur, Respiratory:  Decreased breath sounds at bases GI: Soft, nontender, non-distended  MS: 2+ edema; No deformity. Neuro:  Nonfocal  Psych: Normal affect   Labs    High Sensitivity Troponin:   No results for input(s): TROPONINIHS in the last 720 hours.     Chemistry Recent Labs  Lab 06/22/19 0436 06/23/19 0546 06/24/19 0642  NA 139 139 141  K 3.6 4.0 4.7  CL 105 104 108  CO2 26 27 26   GLUCOSE 110* 105* 81  BUN 20 19 20   CREATININE 0.99 1.03 1.34*  CALCIUM 7.7* 7.8* 7.9*  GFRNONAA >60 >60 49*  GFRAA >60 >60 57*  ANIONGAP 8 8 7      Hematology Recent Labs  Lab 06/22/19 0436 06/23/19 0546 06/24/19 0642  WBC 8.4 7.5 7.1  RBC 3.27* 3.47* 3.40*  HGB 7.6* 8.1* 8.0*  HCT 26.3* 27.8* 26.7*  MCV 80.4 80.1 78.5*  MCH 23.2* 23.3* 23.5*  MCHC 28.9* 29.1* 30.0  RDW 20.2* 20.3* 20.5*  PLT 232 305 294    BNP No results for input(s): BNP, PROBNP in the last 168 hours.   DDimer No results for input(s): DDIMER in the last 168 hours.   Radiology    US Venous Img Upper Uni Left (DVT)  Result Date: 06/23/2019 CLINICAL DATA:  Left upper extremity pain and edema. EXAM: LEFT UPPER EXTREMITY VENOUS DOPPLER ULTRASOUND TECHNIQUE: Gray-scale sonography with graded compression, as well as color Doppler and duplex ultrasound were performed to evaluate the upper extremity  deep venous system from the level of the subclavian vein and including the jugular, axillary, basilic, radial, ulnar and upper cephalic vein. Spectral Doppler was utilized to evaluate flow at rest and with distal augmentation maneuvers. COMPARISON:  None. FINDINGS: Contralateral Subclavian Vein: Respiratory phasicity is normal and symmetric with the symptomatic side. No evidence of thrombus. Normal compressibility. Internal Jugular Vein: No evidence of thrombus. Normal compressibility, respiratory phasicity and response to augmentation. Subclavian Vein: No evidence of thrombus. Normal compressibility, respiratory phasicity and response to augmentation. Axillary Vein: No evidence of thrombus. Normal compressibility, respiratory phasicity and response to augmentation. Cephalic Vein: No evidence of thrombus. Normal compressibility, respiratory phasicity and response to augmentation.  Basilic Vein: No evidence of thrombus. Normal compressibility, respiratory phasicity and response to augmentation. Brachial Veins: No evidence of thrombus. Normal compressibility, respiratory phasicity and response to augmentation. Radial Veins: No evidence of thrombus. Normal compressibility, respiratory phasicity and response to augmentation. Ulnar Veins: No evidence of thrombus. Normal compressibility, respiratory phasicity and response to augmentation. Venous Reflux:  None visualized. Other Findings: No evidence of superficial thrombophlebitis or abnormal fluid collection. IMPRESSION: No evidence of DVT within the left upper extremity. Electronically Signed   By: Aletta Edouard M.D.   On: 06/23/2019 09:19   DG Chest Port 1 View  Result Date: 06/23/2019 CLINICAL DATA:  Status post PICC line placement EXAM: PORTABLE CHEST 1 VIEW COMPARISON:  06/16/2019 FINDINGS: Cardiac shadow is enlarged but stable. Aortic calcifications are seen. Defibrillator is again noted and stable. Stable left basilar atelectasis is seen. Vascular congestion is noted. PICC line is now seen with the tip superimposed over the superior aspect of the right atrium. No other focal abnormality is noted. IMPRESSION: PICC line in the superior right atrium. Stable left basilar atelectasis and vascular congestion. Electronically Signed   By: Inez Catalina M.D.   On: 06/23/2019 12:42   [place] EKG SITE RITE  Result Date: 06/22/2019 If Site Rite image not attached, placement could not be confirmed due to current cardiac rhythm.   Cardiac Studies   Echocardiogram TTE (06/19/19): 1. Left ventricular ejection fraction, by estimation, is 35 to 40%. The  left ventricle has moderately decreased function. The left ventricle has  no regional wall motion abnormalities. The left ventricular internal  cavity size was mildly to moderately  dilated. Left ventricular diastolic parameters are indeterminate.  2. Right ventricular systolic function is  mildly reduced. The right  ventricular size is normal. There is moderately elevated pulmonary artery  systolic pressure. The estimated right ventricular systolic pressure is  123456 mmHg.  3. Left atrial size was severely dilated.  4. Right atrial size was moderately dilated.  5. Moderate pleural effusion in the left lateral region.  6. The mitral valve is normal in structure. Mild mitral valve  regurgitation. No evidence of mitral stenosis.  7. Tricuspid valve regurgitation is moderate.  8. The aortic valve is normal in structure. Aortic valve regurgitation is  not visualized. Mild to moderate aortic valve sclerosis/calcification is  present, without any evidence of aortic stenosis.  9. Moderately dilated pulmonary artery.  10. The inferior vena cava is dilated in size with <50% respiratory  variability, suggesting right atrial pressure of 15 mmHg.  11. No clear vegetation but the RV pacemaker lead appears thinkened. A  vegetation can not be excluded. Consider TEE.   Patient Profile     85 y.o. male 85 year old gentleman with history of CAD status post CABG, PCI, heart failure reduced ejection fraction last EF 35 to  40%, status post ICD 2014, permanent A. fib not on anticoagulation due to GI bleed, deafness who is being seen for group B strep bacteremia, edema  Assessment & Plan    1.  Group B strep bacteremia -On antibiotics -TEE with no evidence for endocarditis. -PICC line placed for prolonged course of antibiotics as per ID/primary team  2. HFrEF EF 35-40% -Continue Toprol, losartan  -Transition to torsemide 40 mg twice daily from IV Lasix. -Continue to monitor creatinine.  3.  History of CAD status post CABG, PCI -Currently with no chest pain -Continue beta-blocker and statin.    4.  Permanent A. Fib -Heart rate controlled.  -Not on anticoagulation due to history of GI bleeds, known AVM.  Total encounter time 35 minutes  Greater than 50% was spent in counseling  and coordination of care with the patient and nursing staff.      Signed, Kate Sable, MD  06/24/2019, 12:33 PM

## 2019-06-25 LAB — COMPREHENSIVE METABOLIC PANEL
ALT: 14 U/L (ref 0–44)
AST: 25 U/L (ref 15–41)
Albumin: 1.7 g/dL — ABNORMAL LOW (ref 3.5–5.0)
Alkaline Phosphatase: 95 U/L (ref 38–126)
Anion gap: 7 (ref 5–15)
BUN: 18 mg/dL (ref 8–23)
CO2: 26 mmol/L (ref 22–32)
Calcium: 7.8 mg/dL — ABNORMAL LOW (ref 8.9–10.3)
Chloride: 108 mmol/L (ref 98–111)
Creatinine, Ser: 1.27 mg/dL — ABNORMAL HIGH (ref 0.61–1.24)
GFR calc Af Amer: >60 mL/min (ref >60)
GFR calc non Af Amer: 52 mL/min — ABNORMAL LOW (ref >60)
Glucose, Bld: 83 mg/dL (ref 70–99)
Potassium: 4.2 mmol/L (ref 3.5–5.1)
Sodium: 141 mmol/L (ref 135–145)
Total Bilirubin: 0.9 mg/dL (ref 0.3–1.2)
Total Protein: 4.5 g/dL — ABNORMAL LOW (ref 6.5–8.1)

## 2019-06-25 LAB — CBC
HCT: 25.8 % — ABNORMAL LOW (ref 39.0–52.0)
Hemoglobin: 7.6 g/dL — ABNORMAL LOW (ref 13.0–17.0)
MCH: 23.5 pg — ABNORMAL LOW (ref 26.0–34.0)
MCHC: 29.5 g/dL — ABNORMAL LOW (ref 30.0–36.0)
MCV: 79.6 fL — ABNORMAL LOW (ref 80.0–100.0)
Platelets: 277 10*3/uL (ref 150–400)
RBC: 3.24 MIL/uL — ABNORMAL LOW (ref 4.22–5.81)
RDW: 20 % — ABNORMAL HIGH (ref 11.5–15.5)
WBC: 6.5 10*3/uL (ref 4.0–10.5)
nRBC: 0 % (ref 0.0–0.2)

## 2019-06-25 MED ORDER — CEFTRIAXONE IV (FOR PTA / DISCHARGE USE ONLY): 2.0000 g | INTRAVENOUS | 0 refills | Status: AC

## 2019-06-25 MED ORDER — TORSEMIDE 20 MG PO TABS: 40.0000 mg | ORAL_TABLET | Freq: Two times a day (BID) | ORAL | 3 refills | Status: DC

## 2019-06-25 MED ORDER — PANTOPRAZOLE SODIUM 40 MG PO TBEC: 40.0000 mg | DELAYED_RELEASE_TABLET | Freq: Two times a day (BID) | ORAL | 0 refills | Status: DC

## 2019-06-25 MED ORDER — LOSARTAN POTASSIUM 25 MG PO TABS: 12.5000 mg | ORAL_TABLET | Freq: Every day | ORAL | 3 refills | Status: DC

## 2019-06-25 MED ORDER — POTASSIUM CHLORIDE CRYS ER 20 MEQ PO TBCR: 20.0000 meq | EXTENDED_RELEASE_TABLET | Freq: Two times a day (BID) | ORAL | 3 refills | Status: DC

## 2019-06-25 NOTE — Progress Notes (Signed)
Thomas [name] to be D/C'd Home per MD order.  Discussed prescriptions and follow up appointments with the patient using translator. Prescriptions given to patient, medication list explained in detail. Pt demonstrated understanding.  Allergies as of 06/25/2019      Reactions   Entresto [sacubitril-valsartan] Swelling   And bruising of arm   Phenazopyridine Nausea Only, Other (See Comments)   GI UPSET   Ramipril Other (See Comments)   unk Other reaction(s): Other (See Comments), Unknown unk      Medication List    STOP taking these medications   isosorbide mononitrate 30 MG 24 hr tablet Commonly known as: IMDUR     TAKE these medications   albuterol 108 (90 Base) MCG/ACT inhaler Commonly known as: VENTOLIN HFA Inhale 2 puffs into the lungs 4 (four) times daily as needed. What changed: reasons to take this   cefTRIAXone  IVPB Commonly known as: ROCEPHIN Inject 2 g into the vein daily for 20 days. Indication:  Group B streptococcus bacteremia Last Day of Therapy:  07/14/19 Labs - Once weekly:  CBC/D, CMP Please pull PICC at completion of IV antibiotics   [employer] Acetaminophen Ex St 500 MG tablet Generic drug: acetaminophen Take 500-1,000 mg by mouth every 6 (six) hours as needed for pain or fever.   ferrous sulfate 325 (65 FE) MG tablet Take 1 tablet (325 mg total) by mouth daily for 30 days.   losartan 25 MG tablet Commonly known as: COZAAR Take 0.5 tablets (12.5 mg total) by mouth daily. Start taking on: June 26, 2019   multivitamin with minerals Tabs tablet Take 1 tablet by mouth daily.   pantoprazole 40 MG tablet Commonly known as: PROTONIX Take 1 tablet (40 mg total) by mouth 2 (two) times daily before a meal.   potassium chloride SA 20 MEQ tablet Commonly known as: KLOR-CON Take 1 tablet (20 mEq total) by mouth 2 (two) times daily.   simvastatin 20 MG tablet Commonly known as: ZOCOR TAKE 1 TABLET BY MOUTH EVERY DAY   torsemide 20 MG tablet Commonly known  as: DEMADEX Take 2 tablets (40 mg total) by mouth 2 (two) times daily. What changed: when to take this   triamcinolone cream 0.1 % Commonly known as: KENALOG Apply 1 application topically 2 (two) times daily as needed for itching. (avoid face, groin and axilla)            Home Infusion Instuctions  (From admission, onward)         Start     Ordered   06/25/19 0000  Home infusion instructions    Question:  Instructions  Answer:  Flushing of vascular access device: 0.9% NaCl pre/post medication administration and prn patency; Heparin 100 u/ml, 65ml for implanted ports and Heparin 10u/ml, 79ml for all other central venous catheters.   06/25/19 1050          Vitals:   06/25/19 1011 06/25/19 1212  BP: (!) 108/49 110/86  Pulse: 60 75  Resp: 18 20  Temp: 98 F (36.7 C) 98 F (36.7 C)  SpO2: 96% 96%    An After Visit Summary was printed and given to the patient. Patient escorted via [place], and D/C home via family transportation.  Marry Guan 06/25/2019 2:03 PM

## 2019-06-25 NOTE — Discharge Summary (Signed)
Physician Discharge Summary  Thomas [name] MBW:[id number] DOB: 1934-12-08 DOA: 06/16/2019  PCP: Thomas Pink, MD  Admit date: 06/16/2019 Discharge date: 06/25/2019  Admitted From: Home  Disposition:  Home with [facility]   Recommendations for Outpatient Follow-up:  1. Follow up with PCP Dr. Kary [name] in 1-2 weeks 2. Dr. Kary [name]: Please obtain BMP and CBC in 1 week 3. Dr. Kary [name]: Please titrate torsemide back to once daily if warranted     Home Health: PT/OT due to ongoing SOB as well as need for home antibiotic infusion  Equipment/Devices: Rolling walker  Discharge Condition: Fair  CODE STATUS: FULL Diet recommendation: Cardiac  Brief/Interim Summary: [name] is a 85 y.o. M with deafness, sCHF EF 35-40%, chronic anasarca, CAD, BiV ICD/pacemaker, HTN, chronic A. fib, possible cirrhosis, and history of GI bleed due to AVMs who presented with chills, generalized malaise, and vomiting for several days.  Heart, chest x-ray unremarkable, antibiotics were started empirically.  Culture subsequently turned positive for group B strep.       PRINCIPAL HOSPITAL DIAGNOSIS: Sepsis due to Group B strep bacteremia    Discharge Diagnoses:   Sepsis secondary to Group B strep bacteremia Patient presented with leukocytosis, fever, AKI, elevated lactic acid.  Started on empiric antibiotics.  Blood cultures growing group B strep.  TEE 4/8 unremarkable.  ID consulted, recommended 4 weeks antibiotics.  Transitioned to ceftriaxone to reduce sodium load.  PICC placed, [facility] consulted and patient arranged for discharge to complete 4 weeks IV antibiotics until 4/30.  Weekly CBC, CMP and biweekly CRP ESR.      Acute on chronic systolic CHF Patient's echo this hospital stay noted dilated right ventricle, elevated right heart pressures, suggesting significant fluid overload  Started on IV diuretics, and Unna boots.  Transitioned back to oral diuretics, torsemide dose increased from  baseline.    Follow up with PCP in 1 week for labs and titration of diuretics.     Hypertension Coronary disease Continue simvastatin  Chronic atrial fibrillation Not on rate control agents, heart rate controlled.  Hold anticoagulation due to severe anemia.    Possible cirrhosis   Chronic anemia, normocytic  Hgb 7-9 g/dL during hospitalization.  7.6 g/dL on discharge, iron continued.  Deferred transfusion given patient's symptoms reportedly were at baseline, without symptoms feel that benefits were outweighed by risks.  Pressure injury, stage II, buttocks, present on arrival   Severe protein calorie malnutrition As evidenced by severe depletion of subcutaneous muscle mass, chronic illness, poor oral intake, and pressure injury.            Discharge Instructions  Discharge Instructions    Diet - low sodium heart healthy   Complete by: As directed    Discharge instructions   Complete by: As directed    From Dr. Loleta [name]: You were admitted for vomiting and feeling sick.  This turned out to be from a bloodstream infection with Group B streptococcus.  You were started on antibiotics. You should take ceftriaxone 2g daily by IV for 4 weeks until April 30th The home health agency will teach you how to administer this They will draw labs once weekly and fax to Thomas [name], the infectious disease doctor Please follow up with Thomas [name] in 3-4 weeks   Also, for your heart (and the swelling caused by heart failure): Change your diuretic dose to TORSEMIDE 40 mg twice daily Take your potassium supplement twice daily Keep the wraps on your legs until the end of this week.  If you are able to see Dr. Kary [name] by Friday, he and his nurses can remove the wraps. Otherwise, you may remove the wraps by Friday.    STOP taking Imdur START taking losartan 12/5 mg (1/2 tab) once daily Go see Dr. Abelina [name] in 2-4 weeks    For your anemia: Have your blood work  checked by Dr. Kary [name] in 1 week Take your iron supplement (ferrous sulfate) once daily Take this with colace to prevent constipation   Home infusion instructions   Complete by: As directed    Instructions: Flushing of vascular access device: 0.9% NaCl pre/post medication administration and prn patency; Heparin 100 u/ml, 86m for implanted ports and Heparin 10u/ml, 564mfor all other central venous catheters.   Increase activity slowly   Complete by: As directed      Allergies as of 06/25/2019      Reactions   Entresto [sacubitril-valsartan] Swelling   And bruising of arm   Phenazopyridine Nausea Only, Other (See Comments)   GI UPSET   Ramipril Other (See Comments)   unk Other reaction(s): Other (See Comments), Unknown unk      Medication List    STOP taking these medications   isosorbide mononitrate 30 MG 24 hr tablet Commonly known as: IMDUR     TAKE these medications   albuterol 108 (90 Base) MCG/ACT inhaler Commonly known as: VENTOLIN HFA Inhale 2 puffs into the lungs 4 (four) times daily as needed. What changed: reasons to take this   cefTRIAXone  IVPB Commonly known as: ROCEPHIN Inject 2 g into the vein daily for 20 days. Indication:  Group B streptococcus bacteremia Last Day of Therapy:  07/14/19 Labs - Once weekly:  CBC/D, CMP Please pull PICC at completion of IV antibiotics   [employer] Acetaminophen Ex St 500 MG tablet Generic drug: acetaminophen Take 500-1,000 mg by mouth every 6 (six) hours as needed for pain or fever.   ferrous sulfate 325 (65 FE) MG tablet Take 1 tablet (325 mg total) by mouth daily for 30 days.   losartan 25 MG tablet Commonly known as: COZAAR Take 0.5 tablets (12.5 mg total) by mouth daily. Start taking on: June 26, 2019   multivitamin with minerals Tabs tablet Take 1 tablet by mouth daily.   pantoprazole 40 MG tablet Commonly known as: PROTONIX Take 1 tablet (40 mg total) by mouth 2 (two) times daily before a meal.   potassium  chloride SA 20 MEQ tablet Commonly known as: KLOR-CON Take 1 tablet (20 mEq total) by mouth 2 (two) times daily.   simvastatin 20 MG tablet Commonly known as: ZOCOR TAKE 1 TABLET BY MOUTH EVERY DAY   torsemide 20 MG tablet Commonly known as: DEMADEX Take 2 tablets (40 mg total) by mouth 2 (two) times daily. What changed: when to take this   triamcinolone cream 0.1 % Commonly known as: KENALOG Apply 1 application topically 2 (two) times daily as needed for itching. (avoid face, groin and axilla)            Home Infusion Instuctions  (From admission, onward)         Start     Ordered   06/25/19 0000  Home infusion instructions    Question:  Instructions  Answer:  Flushing of vascular access device: 0.9% NaCl pre/post medication administration and prn patency; Heparin 100 u/ml, 21m32mor implanted ports and Heparin 10u/ml, 21ml52mr all other central venous catheters.   06/25/19 1050  Follow-up Information    Thomas Pink, MD Follow up.   Specialty: Family Medicine Why: Please schedule hospital follow up appt within 5-7 days of discharge. Contact information: [address] [place] [address] [phone]        Sanda Klein, MD. Schedule an appointment as soon as possible for a visit in 2 week(s).   Specialty: Cardiology Contact information: [address] [place] [address] [phone]        Tsosie Billing, MD. Schedule an appointment as soon as possible for a visit in 1 month(s).   Specialty: Infectious Diseases Contact information: [place] [address] [phone]          Allergies  Allergen Reactions  . Entresto [Sacubitril-Valsartan] Swelling    And bruising of arm  . Phenazopyridine Nausea Only and Other (See Comments)    GI UPSET  . Ramipril Other (See Comments)    unk Other reaction(s): Other (See Comments), Unknown unk     Consultations:  Cardiology  Gastroenterology  Infectious disease   Procedures/Studies: CT ABDOMEN PELVIS WO CONTRAST  Result Date: 06/17/2019 CLINICAL DATA:  Vomiting. Diarrhea. EXAM: CT ABDOMEN AND PELVIS WITHOUT CONTRAST TECHNIQUE: Multidetector CT imaging of the abdomen and pelvis was performed following the standard protocol without IV contrast. COMPARISON:  Aug 14, 2018 FINDINGS: Lower chest: There are moderate-sized bilateral pleural effusions with adjacent atelectasis.There is massive cardiomegaly. Hepatobiliary: The liver surface appears somewhat nodular. Normal gallbladder.There is no biliary ductal dilation. Pancreas: Normal contours without ductal dilatation. No peripancreatic fluid collection. Spleen: The spleen is borderline enlarged. Adrenals/Urinary Tract: --Adrenal glands: No adrenal hemorrhage. --Right kidney/ureter: There is extensive scarring throughout the right kidney without evidence for hydronephrosis or radiopaque obstructing kidney stone. --Left kidney/ureter: There is no left-sided hydronephrosis. --Urinary bladder: Unremarkable. Stomach/Bowel: --Stomach/Duodenum: No hiatal hernia or other gastric abnormality. Normal duodenal course and caliber. --Small bowel: No dilatation or inflammation. --Colon: There is scattered colonic diverticula without CT evidence for diverticulitis. --Appendix: Normal. Vascular/Lymphatic: Extensive atherosclerotic changes are noted. The patient is status post EVAR. --No retroperitoneal lymphadenopathy. --No mesenteric lymphadenopathy. --No pelvic or inguinal lymphadenopathy. Reproductive: Unremarkable Other: No ascites or free air. The abdominal wall is normal. Musculoskeletal. No acute displaced fractures. IMPRESSION: 1. No acute abnormality detected within the abdomen or pelvis. 2. Cardiomegaly with moderate-sized bilateral pleural effusions. 3. Cirrhosis 4. There are additional chronic findings as detailed above. Aortic Atherosclerosis  (ICD10-I70.0). Electronically Signed   By: Constance Holster M.D.   On: 06/17/2019 03:09   US Venous Img Lower Bilateral (DVT)  Result Date: 05/26/2019 CLINICAL DATA:  Bilateral lower extremity edema for the past week. History of malignancy. Evaluate for DVT. EXAM: BILATERAL LOWER EXTREMITY VENOUS DOPPLER ULTRASOUND TECHNIQUE: Gray-scale sonography with graded compression, as well as color Doppler and duplex ultrasound were performed to evaluate the lower extremity deep venous systems from the level of the common femoral vein and including the common femoral, femoral, profunda femoral, popliteal and calf veins including the posterior tibial, peroneal and gastrocnemius veins when visible. The superficial great saphenous vein was also interrogated. Spectral Doppler was utilized to evaluate flow at rest and with distal augmentation maneuvers in the common femoral, femoral and popliteal veins. COMPARISON:  Right lower extremity venous Doppler ultrasound-02/25/2026 (negative). FINDINGS: RIGHT LOWER EXTREMITY Common Femoral Vein: No evidence of thrombus. Normal compressibility, respiratory phasicity and response to augmentation. Saphenofemoral Junction: No evidence of thrombus. Normal compressibility and flow on color Doppler imaging. Profunda Femoral Vein: No evidence  of thrombus. Normal compressibility and flow on color Doppler imaging. Femoral Vein: No evidence of thrombus. Normal compressibility, respiratory phasicity and response to augmentation. Popliteal Vein: No evidence of thrombus. Normal compressibility, respiratory phasicity and response to augmentation. Calf Veins: No evidence of thrombus. Normal compressibility and flow on color Doppler imaging. Superficial Great Saphenous Vein: No evidence of thrombus. Normal compressibility. Venous Reflux:  None. Other Findings:  None. LEFT LOWER EXTREMITY Common Femoral Vein: No evidence of thrombus. Normal compressibility, respiratory phasicity and response to  augmentation. Saphenofemoral Junction: No evidence of thrombus. Normal compressibility and flow on color Doppler imaging. Profunda Femoral Vein: No evidence of thrombus. Normal compressibility and flow on color Doppler imaging. Femoral Vein: No evidence of thrombus. Normal compressibility, respiratory phasicity and response to augmentation. Popliteal Vein: No evidence of thrombus. Normal compressibility, respiratory phasicity and response to augmentation. Calf Veins: No evidence of thrombus. Normal compressibility and flow on color Doppler imaging. Superficial Great Saphenous Vein: No evidence of thrombus. Normal compressibility. Venous Reflux:  None. Other Findings:  None. IMPRESSION: No evidence of DVT within either lower extremity. Electronically Signed   By: Sandi Mariscal M.D.   On: 05/26/2019 17:08   US Venous Img Upper Uni Left (DVT)  Result Date: 06/23/2019 CLINICAL DATA:  Left upper extremity pain and edema. EXAM: LEFT UPPER EXTREMITY VENOUS DOPPLER ULTRASOUND TECHNIQUE: Gray-scale sonography with graded compression, as well as color Doppler and duplex ultrasound were performed to evaluate the upper extremity deep venous system from the level of the subclavian vein and including the jugular, axillary, basilic, radial, ulnar and upper cephalic vein. Spectral Doppler was utilized to evaluate flow at rest and with distal augmentation maneuvers. COMPARISON:  None. FINDINGS: Contralateral Subclavian Vein: Respiratory phasicity is normal and symmetric with the symptomatic side. No evidence of thrombus. Normal compressibility. Internal Jugular Vein: No evidence of thrombus. Normal compressibility, respiratory phasicity and response to augmentation. Subclavian Vein: No evidence of thrombus. Normal compressibility, respiratory phasicity and response to augmentation. Axillary Vein: No evidence of thrombus. Normal compressibility, respiratory phasicity and response to augmentation. Cephalic Vein: No evidence of  thrombus. Normal compressibility, respiratory phasicity and response to augmentation. Basilic Vein: No evidence of thrombus. Normal compressibility, respiratory phasicity and response to augmentation. Brachial Veins: No evidence of thrombus. Normal compressibility, respiratory phasicity and response to augmentation. Radial Veins: No evidence of thrombus. Normal compressibility, respiratory phasicity and response to augmentation. Ulnar Veins: No evidence of thrombus. Normal compressibility, respiratory phasicity and response to augmentation. Venous Reflux:  None visualized. Other Findings: No evidence of superficial thrombophlebitis or abnormal fluid collection. IMPRESSION: No evidence of DVT within the left upper extremity. Electronically Signed   By: Aletta Edouard M.D.   On: 06/23/2019 09:19   DG Chest Port 1 View  Result Date: 06/23/2019 CLINICAL DATA:  Status post PICC line placement EXAM: PORTABLE CHEST 1 VIEW COMPARISON:  06/16/2019 FINDINGS: Cardiac shadow is enlarged but stable. Aortic calcifications are seen. Defibrillator is again noted and stable. Stable left basilar atelectasis is seen. Vascular congestion is noted. PICC line is now seen with the tip superimposed over the superior aspect of the right atrium. No other focal abnormality is noted. IMPRESSION: PICC line in the superior right atrium. Stable left basilar atelectasis and vascular congestion. Electronically Signed   By: Inez Catalina M.D.   On: 06/23/2019 12:42   DG Chest Port 1 View  Result Date: 06/16/2019 CLINICAL DATA:  Fevers and lethargy EXAM: PORTABLE CHEST 1 VIEW COMPARISON:  05/19/2019 FINDINGS: Cardiac shadow is enlarged  in size but stable. Defibrillator is again seen and stable. Postsurgical changes are again noted. Aortic calcifications are seen. Vascular congestion is again identified with mild edema. No focal infiltrate is seen. Stable left basilar atelectasis is noted. No acute bony abnormality is seen. IMPRESSION: Stable  changes of vascular congestion.  No acute abnormality noted. Electronically Signed   By: Inez Catalina M.D.   On: 06/16/2019 21:39   ECHOCARDIOGRAM COMPLETE  Result Date: 06/19/2019    ECHOCARDIOGRAM REPORT   Patient Name:   Thomas [name] Date of Exam: 06/19/2019 Medical Rec #:  [id number]      Height:       74.0 in Accession #:    [id number]     Weight:       228.4 lb Date of Birth:  1934/04/23     BSA:          2.300 m Patient Age:    32 years       BP:           116/77 mmHg Patient Gender: M              HR:           62 bpm. Exam Location:  [facility] Procedure: 2D Echo, Color Doppler and Cardiac Doppler Indications:     R78.81 Bacteremia  History:         Patient has prior history of Echocardiogram examinations, most                  recent 05/21/2019. Ischemic Cardiomyopathy and CHF, CAD, Prior                  CABG and Pacemaker, CKD; Risk Factors:Hypertension.  Sonographer:     Charmayne Sheer RDCS (AE) Referring Phys:  [id number] Otila Kluver LAI Diagnosing Phys: Kathlyn Sacramento MD IMPRESSIONS  1. Left ventricular ejection fraction, by estimation, is 35 to 40%. The left ventricle has moderately decreased function. The left ventricle has no regional wall motion abnormalities. The left ventricular internal cavity size was mildly to moderately dilated. Left ventricular diastolic parameters are indeterminate.  2. Right ventricular systolic function is mildly reduced. The right ventricular size is normal. There is moderately elevated pulmonary artery systolic pressure. The estimated right ventricular systolic pressure is 64.3 mmHg.  3. Left atrial size was severely dilated.  4. Right atrial size was moderately dilated.  5. Moderate pleural effusion in the left lateral region.  6. The mitral valve is normal in structure. Mild mitral valve regurgitation. No evidence of mitral stenosis.  7. Tricuspid valve regurgitation is moderate.  8. The aortic valve is normal in structure. Aortic valve regurgitation is not visualized. Mild to moderate  aortic valve sclerosis/calcification is present, without any evidence of aortic stenosis.  9. Moderately dilated pulmonary artery. 10. The inferior vena cava is dilated in size with <50% respiratory variability, suggesting right atrial pressure of 15 mmHg. 11. No clear vegetation but the RV pacemaker lead appears thinkened. A vegetation can not be excluded. Consider TEE. FINDINGS  Left Ventricle: Left ventricular ejection fraction, by estimation, is 35 to 40%. The left ventricle has moderately decreased function. The left ventricle has no regional wall motion abnormalities. The left ventricular internal cavity size was mildly to moderately dilated. There is no left ventricular hypertrophy. Left ventricular diastolic parameters are indeterminate. Right Ventricle: The right ventricular size is normal. No increase in right ventricular wall thickness. Right ventricular systolic function is mildly reduced. There is moderately elevated pulmonary  artery systolic pressure. The tricuspid regurgitant velocity is 3.35 m/s, and with an assumed right atrial pressure of 15 mmHg, the estimated right ventricular systolic pressure is 60.6 mmHg. Left Atrium: Left atrial size was severely dilated. Right Atrium: Right atrial size was moderately dilated. Pericardium: There is no evidence of pericardial effusion. Mitral Valve: The mitral valve is normal in structure. Normal mobility of the mitral valve leaflets. Mild mitral valve regurgitation. No evidence of mitral valve stenosis. MV peak gradient, 9.9 mmHg. The mean mitral valve gradient is 3.0 mmHg. Tricuspid Valve: The tricuspid valve is normal in structure. Tricuspid valve regurgitation is moderate . No evidence of tricuspid stenosis. Aortic Valve: The aortic valve is normal in structure. Aortic valve regurgitation is not visualized. Mild to moderate aortic valve sclerosis/calcification is present, without any evidence of aortic stenosis. Aortic valve mean gradient measures 7.0  mmHg. Aortic valve peak gradient measures 13.2 mmHg. Aortic valve area, by VTI measures 2.21 cm. Pulmonic Valve: The pulmonic valve was normal in structure. Pulmonic valve regurgitation is mild. No evidence of pulmonic stenosis. Aorta: The aortic root is normal in size and structure. Pulmonary Artery: The pulmonary artery is moderately dilated. Venous: The inferior vena cava is dilated in size with less than 50% respiratory variability, suggesting right atrial pressure of 15 mmHg. IAS/Shunts: No atrial level shunt detected by color flow Doppler. Additional Comments: A pacer wire is visualized. There is a moderate pleural effusion in the left lateral region.  LEFT VENTRICLE PLAX 2D LVIDd:         5.83 cm      Diastology LVIDs:         4.99 cm      LV e' lateral:   9.68 cm/s LV PW:         0.88 cm      LV E/e' lateral: 15.0 LV IVS:        0.84 cm      LV e' medial:    6.09 cm/s LVOT diam:     2.10 cm      LV E/e' medial:  23.9 LV SV:         71 LV SV Index:   31 LVOT Area:     3.46 cm  LV Volumes (MOD) LV vol d, MOD A4C: 221.0 ml LV vol s, MOD A4C: 101.0 ml LV SV MOD A4C:     221.0 ml RIGHT VENTRICLE RV Basal diam:  5.16 cm LEFT ATRIUM            Index       RIGHT ATRIUM           Index LA diam:      5.90 cm  2.57 cm/m  RA Area:     30.90 cm LA Vol (A2C): 85.6 ml  37.22 ml/m RA Volume:   112.00 ml 48.70 ml/m LA Vol (A4C): 148.0 ml 64.36 ml/m  AORTIC VALVE                    PULMONIC VALVE AV Area (Vmax):    2.15 cm     PV Vmax:       1.05 m/s AV Area (Vmean):   1.99 cm     PV Vmean:      69.000 cm/s AV Area (VTI):     2.21 cm     PV VTI:        0.216 m AV Vmax:           182.00  cm/s  PV Peak grad:  4.4 mmHg AV Vmean:          119.000 cm/s PV Mean grad:  2.0 mmHg AV VTI:            0.319 m AV Peak Grad:      13.2 mmHg AV Mean Grad:      7.0 mmHg LVOT Vmax:         113.00 cm/s LVOT Vmean:        68.400 cm/s LVOT VTI:          0.204 m LVOT/AV VTI ratio: 0.64  AORTA Ao Root diam: 4.10 cm MITRAL VALVE                 TRICUSPID VALVE MV Area (PHT): 3.76 cm     TR Peak grad:   44.9 mmHg MV Peak grad:  9.9 mmHg     TR Vmax:        335.00 cm/s MV Mean grad:  3.0 mmHg MV Vmax:       1.57 m/s     SHUNTS MV Vmean:      72.1 cm/s    Systemic VTI:  0.20 m MV Decel Time: 202 msec     Systemic Diam: 2.10 cm MV E velocity: 145.67 cm/s Kathlyn Sacramento MD Electronically signed by Kathlyn Sacramento MD Signature Date/Time: 06/19/2019/1:07:51 PM    Final    ECHO TEE  Result Date: 06/23/2019    TRANSESOPHOGEAL ECHO REPORT   Patient Name:   Thomas [name] Date of Exam: 06/22/2019 Medical Rec #:  [id number]      Height:       74.0 in Accession #:    [id number]     Weight:       228.6 lb Date of Birth:  [birth year]     BSA:          2.301 m Patient Age:    28 years       BP:           115/72 mmHg Patient Gender: M              HR:           60 bpm. Exam Location:  [facility] Procedure: Transesophageal Echo, Color Doppler, Cardiac Doppler and Saline            Contrast Bubble Study Indications:     Bacteremia  History:         Patient has prior history of Echocardiogram examinations, most                  recent 06/19/2019. Defibrillator and Pacemaker;                  Signs/Symptoms:Bacteremia.  Sonographer:     Charmayne Sheer RDCS (AE) Referring Phys:  [id number] Kate Sable Diagnosing Phys: Ida Rogue MD PROCEDURE: The transesophogeal probe was passed without difficulty through the esophogus of the patient. Imaged were obtained with the patient in a left lateral decubitus position. Local oropharyngeal anesthetic was provided with viscous lidocaine. Sedation performed by performing physician. Image quality was excellent. The patient's vital signs; including heart rate, blood pressure, and oxygen saturation; remained stable throughout the procedure. The patient developed no complications during the procedure. IMPRESSIONS  1. No vegetation noted on pacer wires or valves.  2. Left ventricular ejection fraction, by estimation, is 40 to 45%. The left ventricle  has mildly decreased function. The left ventricle demonstrates global hypokinesis.  3.  Right ventricular systolic function is mildly reduced. The right ventricular size is normal.  4. Left atrial size was severely dilated. Atrial/left atrial appendage not well visualized at the base, unable to definately exclude thrombus.  5. Right atrial size was moderately dilated.  6. Mild mitral valve regurgitation, eccentric.  7. Tricuspid valve regurgitation is moderate.  8. The aortic valve is normal in structure. Aortic valve regurgitation is mild. No aortic stenosis is present.  9. There is a small patent foramen ovale with predominantly left to right shunting across the atrial septum. Positive bubble study at rest. Unable to valsalva. 10. There is mild (Grade II) layered and atheroma plaque involving the descending aorta. Conclusion(s)/Recommendation(s): Normal biventricular function without evidence of hemodynamically significant valvular heart disease. FINDINGS  Left Ventricle: Left ventricular ejection fraction, by estimation, is 40 to 45%. The left ventricle has mildly decreased function. The left ventricle demonstrates global hypokinesis. The left ventricular internal cavity size was normal in size. There is  no left ventricular hypertrophy. Right Ventricle: The right ventricular size is normal. No increase in right ventricular wall thickness. Right ventricular systolic function is mildly reduced. Left Atrium: Left atrial size was severely dilated. Spontaneous echo contrast was present in the left atrium and left atrial appendage. No left atrial/left atrial appendage thrombus was detected. Right Atrium: Right atrial size was moderately dilated. Pericardium: There is no evidence of pericardial effusion. Mitral Valve: The mitral valve is normal in structure. Normal mobility of the mitral valve leaflets. Mild mitral valve regurgitation. No evidence of mitral valve stenosis. Tricuspid Valve: The tricuspid valve is normal  in structure. Tricuspid valve regurgitation is moderate . No evidence of tricuspid stenosis. Aortic Valve: The aortic valve is normal in structure. Aortic valve regurgitation is mild. No aortic stenosis is present. Pulmonic Valve: The pulmonic valve was normal in structure. Pulmonic valve regurgitation is not visualized. No evidence of pulmonic stenosis. Aorta: The aortic root is normal in size and structure. There is mild (Grade II) layered and atheroma plaque involving the descending aorta. Venous: The inferior vena cava is normal in size with greater than 50% respiratory variability, suggesting right atrial pressure of 3 mmHg. IAS/Shunts: No atrial level shunt detected by color flow Doppler. Agitated saline contrast was given intravenously to evaluate for intracardiac shunting. A small patent foramen ovale is detected with predominantly left to right shunting across the atrial  septum. Additional Comments: A pacer wire is visualized. Ida Rogue MD Electronically signed by Ida Rogue MD Signature Date/Time: 06/23/2019/5:49:13 PM    Final    CUP PACEART REMOTE DEVICE CHECK  Result Date: 06/12/2019 Battery check. Estimated 3 months remaining until ERI. Wireless transmitting. 97.1% CRT pacing, VSR pacing 2.9%. V sensing EGMs show regular tachycardia by markers; intracardiac EGM not available. Will continue monitoring. Felisa Bonier, RN, MSN  [place] EKG SITE RITE  Result Date: 06/22/2019 If Site Rite image not attached, placement could not be confirmed due to current cardiac rhythm.      Subjective: Through interpreter patient relayed that he is comfortable, feels normal.  No dyspnea, no chest pain.  Still swelling, but improved.  No fever, malaise, nausea.  Discharge Exam: Vitals:   06/25/19 1007 06/25/19 1011  BP: (!) 89/35 (!) 108/49  Pulse: 66 60  Resp:  18  Temp:  98 F (36.7 C)  SpO2:  96%   Vitals:   06/24/19 2016 06/25/19 0406 06/25/19 1007 06/25/19 1011  BP: (!) 118/56 (!) 103/57 (!)  89/35 (!) 108/49  Pulse: 67  65 66 60  Resp: '17 16  18  ' Temp: 98.3 F (36.8 C) 99.1 F (37.3 C)  98 F (36.7 C)  TempSrc: Oral Oral  Oral  SpO2: 91% 91%  96%  Weight:      Height:        General: Pt is alert, awake, not in acute distress Cardiovascular: RRR, nl S1-S2, no murmurs appreciated.   Unna boots in place, moderate 1+ bilateral LE edema.   Respiratory: Normal respiratory rate and rhythm.  CTAB without rales or wheezes. Abdominal: Abdomen soft and non-tender.  No distension or HSM.   Neuro/Psych: Strength symmetric in upper and lower extremities.  Judgment and insight appear normal.   The results of significant diagnostics from this hospitalization (including imaging, microbiology, ancillary and laboratory) are listed below for reference.     Microbiology: Recent Results (from the past 240 hour(s))  C Difficile Quick Screen w PCR reflex     Status: None   Collection Time: 06/16/19  2:06 AM   Specimen: STOOL  Result Value Ref Range Status   C Diff antigen NEGATIVE NEGATIVE Final   C Diff toxin NEGATIVE NEGATIVE Final   C Diff interpretation No C. difficile detected.  Final    Comment: Performed at [facility], [address] [place], [place] [address]  Gastrointestinal Panel by PCR , Stool     Status: None   Collection Time: 06/16/19  2:06 AM  Result Value Ref Range Status   Campylobacter species NOT DETECTED NOT DETECTED Final   Plesimonas shigelloides NOT DETECTED NOT DETECTED Final   Salmonella species NOT DETECTED NOT DETECTED Final   Yersinia enterocolitica NOT DETECTED NOT DETECTED Final   Vibrio species NOT DETECTED NOT DETECTED Final   Vibrio cholerae NOT DETECTED NOT DETECTED Final   Enteroaggregative E coli (EAEC) NOT DETECTED NOT DETECTED Final   Enteropathogenic E coli (EPEC) NOT DETECTED NOT DETECTED Final   Enterotoxigenic E coli (ETEC) NOT DETECTED NOT DETECTED Final   Shiga like toxin producing E coli (STEC) NOT DETECTED NOT DETECTED Final    Shigella/Enteroinvasive E coli (EIEC) NOT DETECTED NOT DETECTED Final   Cryptosporidium NOT DETECTED NOT DETECTED Final   Cyclospora cayetanensis NOT DETECTED NOT DETECTED Final   Entamoeba histolytica NOT DETECTED NOT DETECTED Final   Giardia lamblia NOT DETECTED NOT DETECTED Final   Adenovirus F40/41 NOT DETECTED NOT DETECTED Final   Astrovirus NOT DETECTED NOT DETECTED Final   Norovirus GI/GII NOT DETECTED NOT DETECTED Final   Rotavirus A NOT DETECTED NOT DETECTED Final   Sapovirus (I, II, IV, and V) NOT DETECTED NOT DETECTED Final    Comment: Performed at [facility], [address] [place], [place] [address]  Blood Culture (routine x 2)     Status: Abnormal   Collection Time: 06/16/19  8:52 PM   Specimen: BLOOD  Result Value Ref Range Status   Specimen Description   Final    BLOOD LEFT FA Performed at [facility], [address], [place], [place] [address]    Special Requests   Final    BOTTLES DRAWN AEROBIC AND ANAEROBIC Blood Culture adequate volume Performed at [facility], [address] [place], [place] [address]    Culture  Setup Time   Final    IN BOTH AEROBIC AND ANAEROBIC BOTTLES GRAM POSITIVE COCCI IN PAIRS CRITICAL RESULT CALLED TO, READ BACK BY AND VERIFIED WITH: SCOTT HALL ON 06/17/19 AT 0420 [facility]    Culture (A)  Final  GROUP B STREP(S.AGALACTIAE)ISOLATED SUSCEPTIBILITIES PERFORMED ON PREVIOUS CULTURE WITHIN THE LAST 5 DAYS. Performed at [facility], [place] [address] [place], [place] [address]    Report Status 06/19/2019 FINAL  Final  Respiratory Panel by RT PCR (Flu A&B, Covid) - Nasopharyngeal Swab     Status: None   Collection Time: 06/16/19 10:02 PM   Specimen: Nasopharyngeal Swab  Result Value Ref Range Status   SARS Coronavirus 2 by RT PCR NEGATIVE NEGATIVE Final    Comment: (NOTE) SARS-CoV-2 target nucleic acids are NOT DETECTED. The SARS-CoV-2 RNA is generally detectable in upper respiratoy specimens during  the acute phase of infection. The lowest concentration of SARS-CoV-2 viral copies this assay can detect is 131 copies/mL. A negative result does not preclude SARS-Cov-2 infection and should not be used as the sole basis for treatment or other patient management decisions. A negative result may occur with  improper specimen collection/handling, submission of specimen other than nasopharyngeal swab, presence of viral mutation(s) within the areas targeted by this assay, and inadequate number of viral copies (<131 copies/mL). A negative result must be combined with clinical observations, patient history, and epidemiological information. The expected result is Negative. Fact Sheet for Patients:  PinkCheek.be Fact Sheet for Healthcare Providers:  GravelBags.it This test is not yet ap proved or cleared by the [place] FDA and  has been authorized for detection and/or diagnosis of SARS-CoV-2 by FDA under an Emergency Use Authorization (EUA). This EUA will remain  in effect (meaning this test can be used) for the duration of the COVID-19 declaration under Section 564(b)(1) of the Act, 21 U.S.C. section 360bbb-3(b)(1), unless the authorization is terminated or revoked sooner.    Influenza A by PCR NEGATIVE NEGATIVE Final   Influenza B by PCR NEGATIVE NEGATIVE Final    Comment: (NOTE) The Xpert Xpress SARS-CoV-2/FLU/RSV assay is intended as an aid in  the diagnosis of influenza from Nasopharyngeal swab specimens and  should not be used as a sole basis for treatment. Nasal washings and  aspirates are unacceptable for Xpert Xpress SARS-CoV-2/FLU/RSV  testing. Fact Sheet for Patients: PinkCheek.be Fact Sheet for Healthcare Providers: GravelBags.it This test is not yet approved or cleared by the [place] FDA and  has been authorized for detection and/or diagnosis of  SARS-CoV-2 by  FDA under an Emergency Use Authorization (EUA). This EUA will remain  in effect (meaning this test can be used) for the duration of the  Covid-19 declaration under Section 564(b)(1) of the Act, 21  U.S.C. section 360bbb-3(b)(1), unless the authorization is  terminated or revoked. Performed at [facility], [address] [place], [place] [address]   Blood Culture (routine x 2)     Status: Abnormal   Collection Time: 06/16/19 10:03 PM   Specimen: BLOOD  Result Value Ref Range Status   Specimen Description   Final    BLOOD LEFT FA Performed at [facility], [address] [place], [place] [address]    Special Requests   Final    BOTTLES DRAWN AEROBIC AND ANAEROBIC Blood Culture adequate volume Performed at [facility], [address] [place],  [address]    Culture  Setup Time   Final    Organism ID to follow IN BOTH AEROBIC AND ANAEROBIC BOTTLES GRAM POSITIVE COCCI IN PAIRS CRITICAL VALUE NOTED.  VALUE IS CONSISTENT WITH PREVIOUSLY REPORTED AND CALLED VALUE. Performed at [facility], [place] [address], [place],  [address]    Culture GROUP  B STREP(S.AGALACTIAE)ISOLATED (A)  Final   Report Status 06/19/2019 FINAL  Final   Organism ID, Bacteria GROUP B STREP(S.AGALACTIAE)ISOLATED  Final      Susceptibility   Group b strep(s.agalactiae)isolated - MIC*    CLINDAMYCIN >=1 RESISTANT Resistant     AMPICILLIN <=0.25 SENSITIVE Sensitive     ERYTHROMYCIN >=8 RESISTANT Resistant     VANCOMYCIN 0.5 SENSITIVE Sensitive     CEFTRIAXONE <=0.12 SENSITIVE Sensitive     LEVOFLOXACIN 1 SENSITIVE Sensitive     * GROUP B STREP(S.AGALACTIAE)ISOLATED  Blood Culture ID Panel (Reflexed)     Status: Abnormal   Collection Time: 06/16/19 10:03 PM  Result Value Ref Range Status   Enterococcus species NOT DETECTED NOT DETECTED Final   Listeria monocytogenes NOT DETECTED NOT DETECTED Final   Staphylococcus species NOT DETECTED NOT DETECTED  Final   Staphylococcus aureus (BCID) NOT DETECTED NOT DETECTED Final   Streptococcus species DETECTED (A) NOT DETECTED Final    Comment: CRITICAL RESULT CALLED TO, READ BACK BY AND VERIFIED WITH: SCOTT HALL ON 06/17/19 AT 0608 [facility]    Streptococcus agalactiae DETECTED (A) NOT DETECTED Final    Comment: CRITICAL RESULT CALLED TO, READ BACK BY AND VERIFIED WITH: SCOTT HALL ON 06/17/19 AT 0608 [facility]    Streptococcus pneumoniae NOT DETECTED NOT DETECTED Final   Streptococcus pyogenes NOT DETECTED NOT DETECTED Final   Acinetobacter baumannii NOT DETECTED NOT DETECTED Final   Enterobacteriaceae species NOT DETECTED NOT DETECTED Final   Enterobacter cloacae complex NOT DETECTED NOT DETECTED Final   Escherichia coli NOT DETECTED NOT DETECTED Final   Klebsiella oxytoca NOT DETECTED NOT DETECTED Final   Klebsiella pneumoniae NOT DETECTED NOT DETECTED Final   Proteus species NOT DETECTED NOT DETECTED Final   Serratia marcescens NOT DETECTED NOT DETECTED Final   Haemophilus influenzae NOT DETECTED NOT DETECTED Final   Neisseria meningitidis NOT DETECTED NOT DETECTED Final   Pseudomonas aeruginosa NOT DETECTED NOT DETECTED Final   Candida albicans NOT DETECTED NOT DETECTED Final   Candida glabrata NOT DETECTED NOT DETECTED Final   Candida krusei NOT DETECTED NOT DETECTED Final   Candida parapsilosis NOT DETECTED NOT DETECTED Final   Candida tropicalis NOT DETECTED NOT DETECTED Final    Comment: Performed at [facility], [address], [place], [place] [address]  Urine culture     Status: None   Collection Time: 06/16/19 11:04 PM   Specimen: In/Out Cath Urine  Result Value Ref Range Status   Specimen Description   Final    IN/OUT CATH URINE Performed at [facility], [address] [place], [place] [address]    Special Requests   Final    NONE Performed at [facility], [address], [place], [place] [address]    Culture   Final    NO GROWTH Performed at [facility], [place] [address], [place], [place] [address]    Report Status 06/18/2019 FINAL  Final  Culture, blood (Routine X 2) w Reflex to ID Panel     Status: None (Preliminary result)   Collection Time: 06/18/19  5:07 PM   Specimen: BLOOD  Result Value Ref Range Status   Specimen Description BLOOD BLOOD LEFT HAND  Final   Special Requests   Final    BOTTLES DRAWN AEROBIC AND ANAEROBIC Blood Culture adequate volume   Culture   Final    NO GROWTH 4 DAYS Performed at [facility], [address], [place], [place] [address]  Report Status PENDING  Incomplete  Culture, blood (Routine X 2) w Reflex to ID Panel     Status: None (Preliminary result)   Collection Time: 06/18/19  5:07 PM   Specimen: BLOOD  Result Value Ref Range Status   Specimen Description BLOOD BLOOD RIGHT HAND  Final   Special Requests AEROBIC BOTTLE ONLY Blood Culture adequate volume  Final   Culture   Final    NO GROWTH 4 DAYS Performed at [facility], [address] [place],  [address]    Report Status PENDING  Incomplete     Labs: BNP (last 3 results) Recent Labs    08/11/18 2246 05/19/19 1404 06/16/19 2052  BNP 316.9* 633.0* 9,233.0*   Basic Metabolic Panel: Recent Labs  Lab 06/19/19 0534 06/19/19 0534 06/20/19 0544 06/20/19 0544 06/21/19 0610 06/22/19 0436 06/23/19 0546 06/24/19 0642 06/25/19 0630  NA 140   < > 142   < > 138 139 139 141 141  K 3.7   < > 3.3*   < > 3.6 3.6 4.0 4.7 4.2  CL 107   < > 106   < > 105 105 104 108 108  CO2 29   < > 28   < > '27 26 27 26 26  ' GLUCOSE 114*   < > 98   < > 110* 110* 105* 81 83  BUN 39*   < > 31*   < > 26* '20 19 20 18  ' CREATININE 1.33*   < > 1.14   < > 1.10 0.99 1.03 1.34* 1.27*  CALCIUM 7.8*   < > 7.7*   < > 7.8* 7.7* 7.8* 7.9* 7.8*  MG 2.0  --  1.9  --  2.1 1.9 2.0  --   --    < > = values in this interval not displayed.   Liver Function Tests: Recent Labs  Lab 06/25/19 0630  AST 25  ALT 14  ALKPHOS 95  BILITOT  0.9  PROT 4.5*  ALBUMIN 1.7*   No results for input(s): LIPASE, AMYLASE in the last 168 hours. No results for input(s): AMMONIA in the last 168 hours. CBC: Recent Labs  Lab 06/21/19 0610 06/22/19 0436 06/23/19 0546 06/24/19 0642 06/25/19 0630  WBC 9.3 8.4 7.5 7.1 6.5  HGB 8.0* 7.6* 8.1* 8.0* 7.6*  HCT 27.2* 26.3* 27.8* 26.7* 25.8*  MCV 79.5* 80.4 80.1 78.5* 79.6*  PLT 222 232 305 294 277   Cardiac Enzymes: No results for input(s): CKTOTAL, CKMB, CKMBINDEX, TROPONINI in the last 168 hours. BNP: Invalid input(s): POCBNP CBG: No results for input(s): GLUCAP in the last 168 hours. D-Dimer No results for input(s): DDIMER in the last 72 hours. Hgb A1c No results for input(s): HGBA1C in the last 72 hours. Lipid Profile No results for input(s): CHOL, HDL, LDLCALC, TRIG, CHOLHDL, LDLDIRECT in the last 72 hours. Thyroid function studies No results for input(s): TSH, T4TOTAL, T3FREE, THYROIDAB in the last 72 hours.  Invalid input(s): FREET3 Anemia work up No results for input(s): VITAMINB12, FOLATE, FERRITIN, TIBC, IRON, RETICCTPCT in the last 72 hours. Urinalysis    Component Value Date/Time   COLORURINE YELLOW (A) 06/16/2019 2304   APPEARANCEUR HAZY (A) 06/16/2019 2304   APPEARANCEUR Clear 12/13/2017 0918   LABSPEC 1.014 06/16/2019 2304   LABSPEC 1.021 11/19/2011 1501   PHURINE 5.0 06/16/2019 2304   GLUCOSEU NEGATIVE 06/16/2019 2304   GLUCOSEU Negative 11/19/2011 1501   HGBUR NEGATIVE 06/16/2019 [place] NEGATIVE 06/16/2019 2304  BILIRUBINUR Negative 12/13/2017 0918   BILIRUBINUR Negative 11/19/2011 1501   KETONESUR NEGATIVE 06/16/2019 2304   PROTEINUR 30 (A) 06/16/2019 2304   NITRITE NEGATIVE 06/16/2019 2304   LEUKOCYTESUR NEGATIVE 06/16/2019 2304   LEUKOCYTESUR Negative 11/19/2011 1501   Sepsis Labs Invalid input(s): PROCALCITONIN,  WBC,  LACTICIDVEN Microbiology Recent Results (from the past 240 hour(s))  C Difficile Quick Screen w PCR reflex      Status: None   Collection Time: 06/16/19  2:06 AM   Specimen: STOOL  Result Value Ref Range Status   C Diff antigen NEGATIVE NEGATIVE Final   C Diff toxin NEGATIVE NEGATIVE Final   C Diff interpretation No C. difficile detected.  Final    Comment: Performed at [facility], [address] [place], [place] [address]  Gastrointestinal Panel by PCR , Stool     Status: None   Collection Time: 06/16/19  2:06 AM  Result Value Ref Range Status   Campylobacter species NOT DETECTED NOT DETECTED Final   Plesimonas shigelloides NOT DETECTED NOT DETECTED Final   Salmonella species NOT DETECTED NOT DETECTED Final   Yersinia enterocolitica NOT DETECTED NOT DETECTED Final   Vibrio species NOT DETECTED NOT DETECTED Final   Vibrio cholerae NOT DETECTED NOT DETECTED Final   Enteroaggregative E coli (EAEC) NOT DETECTED NOT DETECTED Final   Enteropathogenic E coli (EPEC) NOT DETECTED NOT DETECTED Final   Enterotoxigenic E coli (ETEC) NOT DETECTED NOT DETECTED Final   Shiga like toxin producing E coli (STEC) NOT DETECTED NOT DETECTED Final   Shigella/Enteroinvasive E coli (EIEC) NOT DETECTED NOT DETECTED Final   Cryptosporidium NOT DETECTED NOT DETECTED Final   Cyclospora cayetanensis NOT DETECTED NOT DETECTED Final   Entamoeba histolytica NOT DETECTED NOT DETECTED Final   Giardia lamblia NOT DETECTED NOT DETECTED Final   Adenovirus F40/41 NOT DETECTED NOT DETECTED Final   Astrovirus NOT DETECTED NOT DETECTED Final   Norovirus GI/GII NOT DETECTED NOT DETECTED Final   Rotavirus A NOT DETECTED NOT DETECTED Final   Sapovirus (I, II, IV, and V) NOT DETECTED NOT DETECTED Final    Comment: Performed at [facility], [address] [place], [place] [address]  Blood Culture (routine x 2)     Status: Abnormal   Collection Time: 06/16/19  8:52 PM   Specimen: BLOOD  Result Value Ref Range Status   Specimen Description   Final    BLOOD LEFT FA Performed at [facility], [address], [place], [place] [address]    Special Requests   Final    BOTTLES DRAWN AEROBIC AND ANAEROBIC Blood Culture adequate volume Performed at [facility], [address] [place], [place] [address]    Culture  Setup Time   Final    IN BOTH AEROBIC AND ANAEROBIC BOTTLES GRAM POSITIVE COCCI IN PAIRS CRITICAL RESULT CALLED TO, READ BACK BY AND VERIFIED WITH: SCOTT HALL ON 06/17/19 AT 0420 [facility]    Culture (A)  Final    GROUP B STREP(S.AGALACTIAE)ISOLATED SUSCEPTIBILITIES PERFORMED ON PREVIOUS CULTURE WITHIN THE LAST 5 DAYS. Performed at [facility], [place] [address] [place], [place] [address]    Report Status 06/19/2019 FINAL  Final  Respiratory Panel by RT PCR (Flu A&B, Covid) - Nasopharyngeal Swab     Status: None   Collection Time: 06/16/19 10:02 PM   Specimen: Nasopharyngeal Swab  Result Value Ref Range Status   SARS Coronavirus 2 by RT PCR NEGATIVE NEGATIVE Final    Comment: (NOTE)  SARS-CoV-2 target nucleic acids are NOT DETECTED. The SARS-CoV-2 RNA is generally detectable in upper respiratoy specimens during the acute phase of infection. The lowest concentration of SARS-CoV-2 viral copies this assay can detect is 131 copies/mL. A negative result does not preclude SARS-Cov-2 infection and should not be used as the sole basis for treatment or other patient management decisions. A negative result may occur with  improper specimen collection/handling, submission of specimen other than nasopharyngeal swab, presence of viral mutation(s) within the areas targeted by this assay, and inadequate number of viral copies (<131 copies/mL). A negative result must be combined with clinical observations, patient history, and epidemiological information. The expected result is Negative. Fact Sheet for Patients:  PinkCheek.be Fact Sheet for Healthcare Providers:  GravelBags.it This test is not yet ap proved or  cleared by the [place] FDA and  has been authorized for detection and/or diagnosis of SARS-CoV-2 by FDA under an Emergency Use Authorization (EUA). This EUA will remain  in effect (meaning this test can be used) for the duration of the COVID-19 declaration under Section 564(b)(1) of the Act, 21 U.S.C. section 360bbb-3(b)(1), unless the authorization is terminated or revoked sooner.    Influenza A by PCR NEGATIVE NEGATIVE Final   Influenza B by PCR NEGATIVE NEGATIVE Final    Comment: (NOTE) The Xpert Xpress SARS-CoV-2/FLU/RSV assay is intended as an aid in  the diagnosis of influenza from Nasopharyngeal swab specimens and  should not be used as a sole basis for treatment. Nasal washings and  aspirates are unacceptable for Xpert Xpress SARS-CoV-2/FLU/RSV  testing. Fact Sheet for Patients: PinkCheek.be Fact Sheet for Healthcare Providers: GravelBags.it This test is not yet approved or cleared by the [place] FDA and  has been authorized for detection and/or diagnosis of SARS-CoV-2 by  FDA under an Emergency Use Authorization (EUA). This EUA will remain  in effect (meaning this test can be used) for the duration of the  Covid-19 declaration under Section 564(b)(1) of the Act, 21  U.S.C. section 360bbb-3(b)(1), unless the authorization is  terminated or revoked. Performed at [facility], [address] [place], [place] [address]   Blood Culture (routine x 2)     Status: Abnormal   Collection Time: 06/16/19 10:03 PM   Specimen: BLOOD  Result Value Ref Range Status   Specimen Description   Final    BLOOD LEFT FA Performed at [facility], [address] [place], [place] [address]    Special Requests   Final    BOTTLES DRAWN AEROBIC AND ANAEROBIC Blood Culture adequate volume Performed at [facility], [address] [place], [place] [address]    Culture  Setup Time   Final     Organism ID to follow IN BOTH AEROBIC AND ANAEROBIC BOTTLES GRAM POSITIVE COCCI IN PAIRS CRITICAL VALUE NOTED.  VALUE IS CONSISTENT WITH PREVIOUSLY REPORTED AND CALLED VALUE. Performed at [facility], [place] [address] [place], [place] [address]    Culture GROUP B STREP(S.AGALACTIAE)ISOLATED (A)  Final   Report Status 06/19/2019 FINAL  Final   Organism ID, Bacteria GROUP B STREP(S.AGALACTIAE)ISOLATED  Final      Susceptibility   Group b strep(s.agalactiae)isolated - MIC*    CLINDAMYCIN >=1 RESISTANT Resistant     AMPICILLIN <=0.25 SENSITIVE Sensitive     ERYTHROMYCIN >=8 RESISTANT Resistant     VANCOMYCIN 0.5 SENSITIVE Sensitive     CEFTRIAXONE <=0.12 SENSITIVE Sensitive     LEVOFLOXACIN 1 SENSITIVE Sensitive     *  GROUP B STREP(S.AGALACTIAE)ISOLATED  Blood Culture ID Panel (Reflexed)     Status: Abnormal   Collection Time: 06/16/19 10:03 PM  Result Value Ref Range Status   Enterococcus species NOT DETECTED NOT DETECTED Final   Listeria monocytogenes NOT DETECTED NOT DETECTED Final   Staphylococcus species NOT DETECTED NOT DETECTED Final   Staphylococcus aureus (BCID) NOT DETECTED NOT DETECTED Final   Streptococcus species DETECTED (A) NOT DETECTED Final    Comment: CRITICAL RESULT CALLED TO, READ BACK BY AND VERIFIED WITH: SCOTT HALL ON 06/17/19 AT 0608 [facility]    Streptococcus agalactiae DETECTED (A) NOT DETECTED Final    Comment: CRITICAL RESULT CALLED TO, READ BACK BY AND VERIFIED WITH: SCOTT HALL ON 06/17/19 AT 0608 [facility]    Streptococcus pneumoniae NOT DETECTED NOT DETECTED Final   Streptococcus pyogenes NOT DETECTED NOT DETECTED Final   Acinetobacter baumannii NOT DETECTED NOT DETECTED Final   Enterobacteriaceae species NOT DETECTED NOT DETECTED Final   Enterobacter cloacae complex NOT DETECTED NOT DETECTED Final   Escherichia coli NOT DETECTED NOT DETECTED Final   Klebsiella oxytoca NOT DETECTED NOT DETECTED Final   Klebsiella pneumoniae NOT DETECTED NOT DETECTED Final    Proteus species NOT DETECTED NOT DETECTED Final   Serratia marcescens NOT DETECTED NOT DETECTED Final   Haemophilus influenzae NOT DETECTED NOT DETECTED Final   Neisseria meningitidis NOT DETECTED NOT DETECTED Final   Pseudomonas aeruginosa NOT DETECTED NOT DETECTED Final   Candida albicans NOT DETECTED NOT DETECTED Final   Candida glabrata NOT DETECTED NOT DETECTED Final   Candida krusei NOT DETECTED NOT DETECTED Final   Candida parapsilosis NOT DETECTED NOT DETECTED Final   Candida tropicalis NOT DETECTED NOT DETECTED Final    Comment: Performed at [facility], [address], [place], [place] [address]  Urine culture     Status: None   Collection Time: 06/16/19 11:04 PM   Specimen: In/Out Cath Urine  Result Value Ref Range Status   Specimen Description   Final    IN/OUT CATH URINE Performed at [facility], [address] [place], [place] [address]    Special Requests   Final    NONE Performed at [facility], [address] [place], [place] [address]    Culture   Final    NO GROWTH Performed at [facility], [place] [address] [place], [place] [address]    Report Status 06/18/2019 FINAL  Final  Culture, blood (Routine X 2) w Reflex to ID Panel     Status: None (Preliminary result)   Collection Time: 06/18/19  5:07 PM   Specimen: BLOOD  Result Value Ref Range Status   Specimen Description BLOOD BLOOD LEFT HAND  Final   Special Requests   Final    BOTTLES DRAWN AEROBIC AND ANAEROBIC Blood Culture adequate volume   Culture   Final    NO GROWTH 4 DAYS Performed at [facility], [address] [place],  [address]    Report Status PENDING  Incomplete  Culture, blood (Routine X 2) w Reflex to ID Panel     Status: None (Preliminary result)   Collection Time: 06/18/19  5:07 PM   Specimen: BLOOD  Result Value Ref Range Status   Specimen Description BLOOD BLOOD RIGHT HAND  Final   Special Requests AEROBIC BOTTLE ONLY Blood  Culture adequate volume  Final   Culture   Final    NO GROWTH 4 DAYS Performed at [facility], [place],  [place], [place] [address]    Report Status PENDING  Incomplete     Time coordinating discharge: 35 minutes The [place] controlled substances registry was reviewed for this patient       SIGNED:   Edwin Dada, MD  [facility] 06/25/2019, 10:57 AM

## 2019-06-25 NOTE — Progress Notes (Signed)
Progress Note  Patient Name: Thomas [name] Date of Encounter: 06/25/2019  Primary Cardiologist: Sanda Klein, MD   Subjective   No acute events over the past 24 hours.  No fevers or chills noted.  Creatinine stabilizing.  Net -900 cc on oral torsemide.  Inpatient Medications    Scheduled Meds: . Chlorhexidine Gluconate Cloth  6 each Topical Daily  . ferrous sulfate  325 mg Oral Q breakfast  . losartan  12.5 mg Oral Daily  . pantoprazole  40 mg Oral Daily  . potassium chloride  40 mEq Oral BID  . simvastatin  20 mg Oral Daily  . torsemide  40 mg Oral BID   Continuous Infusions: . sodium chloride 40 mL (06/24/19 1631)  . cefTRIAXone (ROCEPHIN)  IV 2 g (06/24/19 1632)   PRN Meds: sodium chloride, acetaminophen, albuterol, sodium chloride flush   Vital Signs    Vitals:   06/24/19 2016 06/25/19 0406 06/25/19 1007 06/25/19 1011  BP: (!) 118/56 (!) 103/57 (!) 89/35 (!) 108/49  Pulse: 67 65 66 60  Resp: 17 16  18   Temp: 98.3 F (36.8 C) 99.1 F (37.3 C)  98 F (36.7 C)  TempSrc: Oral Oral  Oral  SpO2: 91% 91%  96%  Weight:      Height:        Intake/Output Summary (Last 24 hours) at 06/25/2019 1104 Last data filed at 06/25/2019 1000 Gross per 24 hour  Intake 360 ml  Output 1025 ml  Net -665 ml   Last 3 Weights 06/24/2019 06/23/2019 06/22/2019  Weight (lbs) 224 lb 3.3 oz (No Data) 228 lb 9.9 oz  Weight (kg) 101.7 kg (No Data) 103.7 kg      Telemetry    Currently off telemetry.  ECG    No new tracing observed- Personally Reviewed  Physical Exam   GEN: No acute distress.   Neck:  No JVD noted Cardiac:  Regular rhythm, systolic murmur, Respiratory:  Decreased breath sounds at bases GI: Soft, nontender, non-distended  MS: 2+ edema; No deformity. Neuro:  Nonfocal  Psych: Normal affect   Labs    High Sensitivity Troponin:   No results for input(s): TROPONINIHS in the last 720 hours.    Chemistry Recent Labs  Lab 06/23/19 0546 06/24/19 0642  06/25/19 0630  NA 139 141 141  K 4.0 4.7 4.2  CL 104 108 108  CO2 27 26 26   GLUCOSE 105* 81 83  BUN 19 20 18   CREATININE 1.03 1.34* 1.27*  CALCIUM 7.8* 7.9* 7.8*  PROT  --   --  4.5*  ALBUMIN  --   --  1.7*  AST  --   --  25  ALT  --   --  14  ALKPHOS  --   --  95  BILITOT  --   --  0.9  GFRNONAA >60 49* 52*  GFRAA >60 57* >60  ANIONGAP 8 7 7      Hematology Recent Labs  Lab 06/23/19 0546 06/24/19 0642 06/25/19 0630  WBC 7.5 7.1 6.5  RBC 3.47* 3.40* 3.24*  HGB 8.1* 8.0* 7.6*  HCT 27.8* 26.7* 25.8*  MCV 80.1 78.5* 79.6*  MCH 23.3* 23.5* 23.5*  MCHC 29.1* 30.0 29.5*  RDW 20.3* 20.5* 20.0*  PLT 305 294 277    BNP No results for input(s): BNP, PROBNP in the last 168 hours.   DDimer No results for input(s): DDIMER in the last 168 hours.   Radiology    DG Chest  Port 1 View  Result Date: 06/23/2019 CLINICAL DATA:  Status post PICC line placement EXAM: PORTABLE CHEST 1 VIEW COMPARISON:  06/16/2019 FINDINGS: Cardiac shadow is enlarged but stable. Aortic calcifications are seen. Defibrillator is again noted and stable. Stable left basilar atelectasis is seen. Vascular congestion is noted. PICC line is now seen with the tip superimposed over the superior aspect of the right atrium. No other focal abnormality is noted. IMPRESSION: PICC line in the superior right atrium. Stable left basilar atelectasis and vascular congestion. Electronically Signed   By: Inez Catalina M.D.   On: 06/23/2019 12:42    Cardiac Studies   Echocardiogram TTE (06/19/19): 1. Left ventricular ejection fraction, by estimation, is 35 to 40%. The  left ventricle has moderately decreased function. The left ventricle has  no regional wall motion abnormalities. The left ventricular internal  cavity size was mildly to moderately  dilated. Left ventricular diastolic parameters are indeterminate.  2. Right ventricular systolic function is mildly reduced. The right  ventricular size is normal. There is  moderately elevated pulmonary artery  systolic pressure. The estimated right ventricular systolic pressure is  123456 mmHg.  3. Left atrial size was severely dilated.  4. Right atrial size was moderately dilated.  5. Moderate pleural effusion in the left lateral region.  6. The mitral valve is normal in structure. Mild mitral valve  regurgitation. No evidence of mitral stenosis.  7. Tricuspid valve regurgitation is moderate.  8. The aortic valve is normal in structure. Aortic valve regurgitation is  not visualized. Mild to moderate aortic valve sclerosis/calcification is  present, without any evidence of aortic stenosis.  9. Moderately dilated pulmonary artery.  10. The inferior vena cava is dilated in size with <50% respiratory  variability, suggesting right atrial pressure of 15 mmHg.  11. No clear vegetation but the RV pacemaker lead appears thinkened. A  vegetation can not be excluded. Consider TEE.   Patient Profile     85 y.o. male 85 year old gentleman with history of CAD status post CABG, PCI, heart failure reduced ejection fraction last EF 35 to 40%, status post ICD 2014, permanent A. fib not on anticoagulation due to GI bleed, deafness who is being seen for group B strep bacteremia, edema  Assessment & Plan    1.  Group B strep bacteremia -On antibiotics -TEE with no evidence for endocarditis. -PICC line placed for prolonged course of antibiotics as per ID/primary team  2. HFrEF EF 35-40% -Net -900 cc.  Continue to keep patient net negative. -Continue Toprol, losartan  -Continue oral torsemide 40 mg twice daily. -Creatinine stabilizing  3.  History of CAD status post CABG, PCI -Currently with no chest pain -Continue beta-blocker and statin.    4.  Permanent A. Fib -Heart rate controlled.  -Not on anticoagulation due to history of GI bleeds, known AVM.  No further cardiac input at this time.  Continue current cardiac medications.  Recommend close follow-up  with primary cardiologist.    Signed, Kate Sable, MD  06/25/2019, 11:04 AM

## 2019-06-25 NOTE — Progress Notes (Signed)
Physical Therapy Treatment Patient Details Name: Thomas [name] MRN: [id number] DOB: 01/11/1935 Today's Date: 06/25/2019    History of Present Illness presented to ER secondary to chills, vomiting, diarrhea, AMS and decreased responsiveness; admitted for management of SIRS/sepsis related to group B strep bacteremia.  S/P TEE (06/22/19) with no noted vegetations, mod TR, PFO and EF approx 40%    PT Comments    Interpreter arrived for session.  Lucretia Roers and available throughout.  OOB without assist.  Stood and was able to complete lap around unit x 1 with 2 self initiated rest breaks and one where he leaned against wall due to fatigue.  Steady with gait.  To commode for small BM after session.  He remains up in recliner with no further questions and feels comfortable with discharge home.   Follow Up Recommendations  Home health PT     Equipment Recommendations  Rolling walker with 5" wheels    Recommendations for Other Services       Precautions / Restrictions Restrictions Weight Bearing Restrictions: No    Mobility  Bed Mobility Overal bed mobility: Needs Assistance Bed Mobility: Supine to Sit     Supine to sit: Modified independent (Device/Increase time)        Transfers Overall transfer level: Needs assistance Equipment used: Rolling walker (2 wheeled) Transfers: Sit to/from Stand Sit to Stand: Supervision            Ambulation/Gait Ambulation/Gait assistance: Supervision;Min guard Gait Distance (Feet): 200 Feet Assistive device: Rolling walker (2 wheeled) Gait Pattern/deviations: Step-through pattern Gait velocity: slow but steady   General Gait Details: generally safe and steady.  fatigued with self initiated rest breaks.   Stairs             Wheelchair Mobility    Modified Rankin (Stroke Patients Only)       Balance Overall balance assessment: History of Falls;Needs assistance Sitting-balance support: No upper extremity supported;Feet  supported Sitting balance-Leahy Scale: Good     Standing balance support: Bilateral upper extremity supported Standing balance-Leahy Scale: Good                              Cognition Arousal/Alertness: Awake/alert Behavior During Therapy: WFL for tasks assessed/performed Overall Cognitive Status: Within Functional Limits for tasks assessed                                        Exercises Other Exercises Other Exercises: to commode, small BM    General Comments        Pertinent Vitals/Pain Pain Assessment: No/denies pain    Home Living                      Prior Function            PT Goals (current goals can now be found in the care plan section) Progress towards PT goals: Progressing toward goals    Frequency    Min 2X/week      PT Plan Current plan remains appropriate    Co-evaluation              AM-PAC PT "6 Clicks" Mobility   Outcome Measure  Help needed turning from your back to your side while in a flat bed without using bedrails?: None Help needed moving from lying on your back  to sitting on the side of a flat bed without using bedrails?: None Help needed moving to and from a bed to a chair (including a wheelchair)?: A Little Help needed standing up from a chair using your arms (e.g., wheelchair or bedside chair)?: A Little Help needed to walk in hospital room?: A Little Help needed climbing 3-5 steps with a railing? : A Little 6 Click Score: 20    End of Session Equipment Utilized During Treatment: Gait belt Activity Tolerance: Patient tolerated treatment well;Patient limited by fatigue Patient left: in chair;with call bell/phone within reach;with chair alarm set Nurse Communication: Mobility status Pain - part of body: Leg     Time: [id number] PT Time Calculation (min) (ACUTE ONLY): 24 min  Charges:  $Gait Training: 8-22 mins $Therapeutic Activity: 8-22 mins                     [name], PTA 06/25/19, 11:40 AM

## 2019-06-26 ENCOUNTER — Telehealth: Payer: Self-pay | Admitting: Cardiovascular Disease

## 2019-06-26 DIAGNOSIS — I499 Cardiac arrhythmia, unspecified: Secondary | ICD-10-CM | POA: Diagnosis not present

## 2019-06-26 DIAGNOSIS — K219 Gastro-esophageal reflux disease without esophagitis: Secondary | ICD-10-CM | POA: Diagnosis not present

## 2019-06-26 DIAGNOSIS — I252 Old myocardial infarction: Secondary | ICD-10-CM | POA: Diagnosis not present

## 2019-06-26 DIAGNOSIS — I255 Ischemic cardiomyopathy: Secondary | ICD-10-CM | POA: Diagnosis not present

## 2019-06-26 DIAGNOSIS — I2510 Atherosclerotic heart disease of native coronary artery without angina pectoris: Secondary | ICD-10-CM | POA: Diagnosis not present

## 2019-06-26 DIAGNOSIS — H9193 Unspecified hearing loss, bilateral: Secondary | ICD-10-CM | POA: Diagnosis not present

## 2019-06-26 DIAGNOSIS — Z7951 Long term (current) use of inhaled steroids: Secondary | ICD-10-CM | POA: Diagnosis not present

## 2019-06-26 DIAGNOSIS — R7881 Bacteremia: Secondary | ICD-10-CM | POA: Diagnosis not present

## 2019-06-26 DIAGNOSIS — Z9181 History of falling: Secondary | ICD-10-CM | POA: Diagnosis not present

## 2019-06-26 DIAGNOSIS — N183 Chronic kidney disease, stage 3 unspecified: Secondary | ICD-10-CM | POA: Diagnosis not present

## 2019-06-26 DIAGNOSIS — E7800 Pure hypercholesterolemia, unspecified: Secondary | ICD-10-CM | POA: Diagnosis not present

## 2019-06-26 DIAGNOSIS — I071 Rheumatic tricuspid insufficiency: Secondary | ICD-10-CM | POA: Diagnosis not present

## 2019-06-26 DIAGNOSIS — T827XXA Infection and inflammatory reaction due to other cardiac and vascular devices, implants and grafts, initial encounter: Secondary | ICD-10-CM | POA: Diagnosis not present

## 2019-06-26 DIAGNOSIS — Z8673 Personal history of transient ischemic attack (TIA), and cerebral infarction without residual deficits: Secondary | ICD-10-CM | POA: Diagnosis not present

## 2019-06-26 DIAGNOSIS — I441 Atrioventricular block, second degree: Secondary | ICD-10-CM | POA: Diagnosis not present

## 2019-06-26 DIAGNOSIS — I5042 Chronic combined systolic (congestive) and diastolic (congestive) heart failure: Secondary | ICD-10-CM | POA: Diagnosis not present

## 2019-06-26 DIAGNOSIS — Z950 Presence of cardiac pacemaker: Secondary | ICD-10-CM | POA: Diagnosis not present

## 2019-06-26 DIAGNOSIS — I890 Lymphedema, not elsewhere classified: Secondary | ICD-10-CM | POA: Diagnosis not present

## 2019-06-26 DIAGNOSIS — Z95810 Presence of automatic (implantable) cardiac defibrillator: Secondary | ICD-10-CM | POA: Diagnosis not present

## 2019-06-26 DIAGNOSIS — D631 Anemia in chronic kidney disease: Secondary | ICD-10-CM | POA: Diagnosis not present

## 2019-06-26 DIAGNOSIS — B951 Streptococcus, group B, as the cause of diseases classified elsewhere: Secondary | ICD-10-CM | POA: Diagnosis not present

## 2019-06-26 DIAGNOSIS — Z8546 Personal history of malignant neoplasm of prostate: Secondary | ICD-10-CM | POA: Diagnosis not present

## 2019-06-26 DIAGNOSIS — I110 Hypertensive heart disease with heart failure: Secondary | ICD-10-CM | POA: Diagnosis not present

## 2019-06-26 DIAGNOSIS — I4821 Permanent atrial fibrillation: Secondary | ICD-10-CM | POA: Diagnosis not present

## 2019-06-26 DIAGNOSIS — Z951 Presence of aortocoronary bypass graft: Secondary | ICD-10-CM | POA: Diagnosis not present

## 2019-06-26 DIAGNOSIS — Z87891 Personal history of nicotine dependence: Secondary | ICD-10-CM | POA: Diagnosis not present

## 2019-06-26 NOTE — Telephone Encounter (Signed)
New Message    Pts wife is calling and says th pt is feeling weak and was told the pt needed an in office with Dr Sallyanne Kuster  Pts wife says the pt doesn't want to see a PA and needs to only see Dr Sallyanne Kuster in the office     Please call

## 2019-06-27 ENCOUNTER — Telehealth: Payer: Self-pay | Admitting: Physician Assistant

## 2019-06-27 DIAGNOSIS — Z79899 Other long term (current) drug therapy: Secondary | ICD-10-CM

## 2019-06-27 DIAGNOSIS — A409 Streptococcal sepsis, unspecified: Secondary | ICD-10-CM | POA: Diagnosis not present

## 2019-06-27 DIAGNOSIS — K922 Gastrointestinal hemorrhage, unspecified: Secondary | ICD-10-CM | POA: Diagnosis not present

## 2019-06-27 DIAGNOSIS — D649 Anemia, unspecified: Secondary | ICD-10-CM | POA: Diagnosis not present

## 2019-06-27 DIAGNOSIS — I959 Hypotension, unspecified: Secondary | ICD-10-CM | POA: Diagnosis not present

## 2019-06-27 NOTE — Telephone Encounter (Signed)
Left message via sign language interpreter that it is OK for wife to come to visit

## 2019-06-27 NOTE — Telephone Encounter (Signed)
LM via sign language interpreter concerning wife coming to 07/13/19 visit. Will await call back concerning this matter

## 2019-06-27 NOTE — Telephone Encounter (Signed)
Brodin is calling with a sign language interpreter requesting his wife attend his upcoming appointment scheduled for 07/13/19 due to being unstable and still needing a walker & assitance to get around. Please advise.

## 2019-06-27 NOTE — Telephone Encounter (Signed)
Paged by home health nurse, patient has gained 3 pounds and she has noted crackles on lungs. Advised to increase torsemide to 60 mg tonight and tomorrow morning along with K. Kur 30 mEq.  Patient was seen by PCP today per nurse however no records. His Coreg was discontinued,  we do not know the reason.  Nurse will follow-up with PCP tomorrow and forward note for Dr. Sallyanne Kuster to review.

## 2019-06-27 NOTE — Telephone Encounter (Signed)
Left message to call back  

## 2019-06-28 ENCOUNTER — Telehealth: Payer: Self-pay | Admitting: Physician Assistant

## 2019-06-28 MED ORDER — METOLAZONE 2.5 MG PO TABS: 2.5000 mg | ORAL_TABLET | Freq: Once | ORAL | 0 refills | Status: DC

## 2019-06-28 NOTE — Telephone Encounter (Signed)
Spoke with wife via interpreter who report despite the increase dose in torsemide as instructed by Bhagat, pt gained another lb overnight for a total of 4 lbs. She report pt denies SOB but legs and testicles are still swollen.     Will route to MD for recommendations

## 2019-06-28 NOTE — Telephone Encounter (Signed)
Spoke with Hinton Dyer and informed MD has already been made aware and wife has been advised of plan.

## 2019-06-28 NOTE — Telephone Encounter (Signed)
Left message to call back  

## 2019-06-28 NOTE — Addendum Note (Signed)
Addended by: Meryl Crutch on: 06/28/2019 02:43 PM   Modules accepted: Orders

## 2019-06-28 NOTE — Telephone Encounter (Signed)
MD made aware that first available appointment is 4/20 at 3:15 pm with Jory Sims, DNP and ok for pt to wait until then. Wife update via interpreter and verbalized understanding. New orders placed.

## 2019-06-28 NOTE — Telephone Encounter (Signed)
New message   Per Hinton Dyer had a weight gain of 4lbs overnight. Would like a call back to discuss this patient. Hinton Dyer states that he is not home health appropriate.

## 2019-06-28 NOTE — Telephone Encounter (Signed)
Please give one dose of metolazone 2.5 mg daily tomorrow morning 30 minutes before the torsemide and bring in for first available APP in 2-3 days with BMET that same day

## 2019-06-29 ENCOUNTER — Telehealth: Payer: Self-pay | Admitting: Cardiovascular Disease

## 2019-06-29 NOTE — Telephone Encounter (Signed)
Nan returning [employer].

## 2019-06-29 NOTE — Telephone Encounter (Signed)
Wife just picked up prescription per [facility].  Advised to have Pt take metalazone 30 minutes prior to torsemide tomorrow morning 06/30/19

## 2019-06-29 NOTE — Telephone Encounter (Signed)
New message   Per Bonnita Nasuti need clarification on when to take this medication metolazone (ZAROXOLYN) 2.5 MG tablet(Expired). Please call to discuss.

## 2019-06-29 NOTE — Telephone Encounter (Signed)
Left detailed message.  Advised Pt was told to take metalozone x 1 on 06/28/19.  No further refills were given.  F/u scheduled for 07/04/19  Advised to call with any further questions.

## 2019-06-30 DIAGNOSIS — A401 Sepsis due to streptococcus, group B: Secondary | ICD-10-CM | POA: Diagnosis not present

## 2019-06-30 DIAGNOSIS — I5042 Chronic combined systolic (congestive) and diastolic (congestive) heart failure: Secondary | ICD-10-CM | POA: Diagnosis not present

## 2019-06-30 LAB — CULTURE, BLOOD (ROUTINE X 2)
Culture: NO GROWTH
Culture: NO GROWTH
Special Requests: ADEQUATE
Special Requests: ADEQUATE

## 2019-07-03 DIAGNOSIS — Z9181 History of falling: Secondary | ICD-10-CM | POA: Diagnosis not present

## 2019-07-03 DIAGNOSIS — Z95810 Presence of automatic (implantable) cardiac defibrillator: Secondary | ICD-10-CM | POA: Diagnosis not present

## 2019-07-03 DIAGNOSIS — I252 Old myocardial infarction: Secondary | ICD-10-CM | POA: Diagnosis not present

## 2019-07-03 DIAGNOSIS — E7800 Pure hypercholesterolemia, unspecified: Secondary | ICD-10-CM | POA: Diagnosis not present

## 2019-07-03 DIAGNOSIS — I255 Ischemic cardiomyopathy: Secondary | ICD-10-CM | POA: Diagnosis not present

## 2019-07-03 DIAGNOSIS — I4821 Permanent atrial fibrillation: Secondary | ICD-10-CM | POA: Diagnosis not present

## 2019-07-03 DIAGNOSIS — I441 Atrioventricular block, second degree: Secondary | ICD-10-CM | POA: Diagnosis not present

## 2019-07-03 DIAGNOSIS — Z87891 Personal history of nicotine dependence: Secondary | ICD-10-CM | POA: Diagnosis not present

## 2019-07-03 DIAGNOSIS — Z8673 Personal history of transient ischemic attack (TIA), and cerebral infarction without residual deficits: Secondary | ICD-10-CM | POA: Diagnosis not present

## 2019-07-03 DIAGNOSIS — I890 Lymphedema, not elsewhere classified: Secondary | ICD-10-CM | POA: Diagnosis not present

## 2019-07-03 DIAGNOSIS — Z8546 Personal history of malignant neoplasm of prostate: Secondary | ICD-10-CM | POA: Diagnosis not present

## 2019-07-03 DIAGNOSIS — Z7951 Long term (current) use of inhaled steroids: Secondary | ICD-10-CM | POA: Diagnosis not present

## 2019-07-03 DIAGNOSIS — I2510 Atherosclerotic heart disease of native coronary artery without angina pectoris: Secondary | ICD-10-CM | POA: Diagnosis not present

## 2019-07-03 DIAGNOSIS — I499 Cardiac arrhythmia, unspecified: Secondary | ICD-10-CM | POA: Diagnosis not present

## 2019-07-03 DIAGNOSIS — Z950 Presence of cardiac pacemaker: Secondary | ICD-10-CM | POA: Diagnosis not present

## 2019-07-03 DIAGNOSIS — N183 Chronic kidney disease, stage 3 unspecified: Secondary | ICD-10-CM | POA: Diagnosis not present

## 2019-07-03 DIAGNOSIS — Z951 Presence of aortocoronary bypass graft: Secondary | ICD-10-CM | POA: Diagnosis not present

## 2019-07-03 DIAGNOSIS — I071 Rheumatic tricuspid insufficiency: Secondary | ICD-10-CM | POA: Diagnosis not present

## 2019-07-03 DIAGNOSIS — I5042 Chronic combined systolic (congestive) and diastolic (congestive) heart failure: Secondary | ICD-10-CM | POA: Diagnosis not present

## 2019-07-03 DIAGNOSIS — K219 Gastro-esophageal reflux disease without esophagitis: Secondary | ICD-10-CM | POA: Diagnosis not present

## 2019-07-03 DIAGNOSIS — D631 Anemia in chronic kidney disease: Secondary | ICD-10-CM | POA: Diagnosis not present

## 2019-07-03 DIAGNOSIS — H9193 Unspecified hearing loss, bilateral: Secondary | ICD-10-CM | POA: Diagnosis not present

## 2019-07-03 DIAGNOSIS — I110 Hypertensive heart disease with heart failure: Secondary | ICD-10-CM | POA: Diagnosis not present

## 2019-07-04 ENCOUNTER — Ambulatory Visit (INDEPENDENT_AMBULATORY_CARE_PROVIDER_SITE_OTHER): Payer: PPO | Admitting: Adult Health

## 2019-07-04 ENCOUNTER — Encounter: Payer: Self-pay | Admitting: Adult Health

## 2019-07-04 ENCOUNTER — Other Ambulatory Visit: Payer: Self-pay

## 2019-07-04 VITALS — BP 94/52 | HR 77 | Ht 74.0 in | Wt 207.4 lb

## 2019-07-04 DIAGNOSIS — Z951 Presence of aortocoronary bypass graft: Secondary | ICD-10-CM | POA: Diagnosis not present

## 2019-07-04 DIAGNOSIS — I252 Old myocardial infarction: Secondary | ICD-10-CM | POA: Diagnosis not present

## 2019-07-04 DIAGNOSIS — Z95810 Presence of automatic (implantable) cardiac defibrillator: Secondary | ICD-10-CM | POA: Diagnosis not present

## 2019-07-04 DIAGNOSIS — I499 Cardiac arrhythmia, unspecified: Secondary | ICD-10-CM | POA: Diagnosis not present

## 2019-07-04 DIAGNOSIS — I5022 Chronic systolic (congestive) heart failure: Secondary | ICD-10-CM | POA: Diagnosis not present

## 2019-07-04 DIAGNOSIS — I441 Atrioventricular block, second degree: Secondary | ICD-10-CM | POA: Diagnosis not present

## 2019-07-04 DIAGNOSIS — Z950 Presence of cardiac pacemaker: Secondary | ICD-10-CM | POA: Diagnosis not present

## 2019-07-04 DIAGNOSIS — I890 Lymphedema, not elsewhere classified: Secondary | ICD-10-CM | POA: Diagnosis not present

## 2019-07-04 DIAGNOSIS — Z8673 Personal history of transient ischemic attack (TIA), and cerebral infarction without residual deficits: Secondary | ICD-10-CM | POA: Diagnosis not present

## 2019-07-04 DIAGNOSIS — I110 Hypertensive heart disease with heart failure: Secondary | ICD-10-CM | POA: Diagnosis not present

## 2019-07-04 DIAGNOSIS — Z7951 Long term (current) use of inhaled steroids: Secondary | ICD-10-CM | POA: Diagnosis not present

## 2019-07-04 DIAGNOSIS — K219 Gastro-esophageal reflux disease without esophagitis: Secondary | ICD-10-CM | POA: Diagnosis not present

## 2019-07-04 DIAGNOSIS — D631 Anemia in chronic kidney disease: Secondary | ICD-10-CM | POA: Diagnosis not present

## 2019-07-04 DIAGNOSIS — Z9181 History of falling: Secondary | ICD-10-CM | POA: Diagnosis not present

## 2019-07-04 DIAGNOSIS — I10 Essential (primary) hypertension: Secondary | ICD-10-CM

## 2019-07-04 DIAGNOSIS — H9193 Unspecified hearing loss, bilateral: Secondary | ICD-10-CM | POA: Diagnosis not present

## 2019-07-04 DIAGNOSIS — N183 Chronic kidney disease, stage 3 unspecified: Secondary | ICD-10-CM | POA: Diagnosis not present

## 2019-07-04 DIAGNOSIS — I5042 Chronic combined systolic (congestive) and diastolic (congestive) heart failure: Secondary | ICD-10-CM | POA: Diagnosis not present

## 2019-07-04 DIAGNOSIS — I2510 Atherosclerotic heart disease of native coronary artery without angina pectoris: Secondary | ICD-10-CM | POA: Diagnosis not present

## 2019-07-04 DIAGNOSIS — I255 Ischemic cardiomyopathy: Secondary | ICD-10-CM

## 2019-07-04 DIAGNOSIS — R8271 Bacteriuria: Secondary | ICD-10-CM

## 2019-07-04 DIAGNOSIS — I4821 Permanent atrial fibrillation: Secondary | ICD-10-CM | POA: Diagnosis not present

## 2019-07-04 DIAGNOSIS — E7800 Pure hypercholesterolemia, unspecified: Secondary | ICD-10-CM | POA: Diagnosis not present

## 2019-07-04 DIAGNOSIS — I071 Rheumatic tricuspid insufficiency: Secondary | ICD-10-CM | POA: Diagnosis not present

## 2019-07-04 DIAGNOSIS — Z8546 Personal history of malignant neoplasm of prostate: Secondary | ICD-10-CM | POA: Diagnosis not present

## 2019-07-04 DIAGNOSIS — Z87891 Personal history of nicotine dependence: Secondary | ICD-10-CM | POA: Diagnosis not present

## 2019-07-04 NOTE — Progress Notes (Signed)
Cardiology Office Note   Date:  07/04/2019   ID:  Jael, Krause 23-Sep-1934, MRN [id number]  PCP:  [name], MD  Cardiologist:  Dr. Sallyanne Kuster CC: Hospital Follow up    History of Present Illness: Thomas [name] is a 85 y.o. male who presents for post hospital follow up with multiple medical problems. History of CAD status post remote CABG and subsequent PCI, chronic systolic and diastolic heart failure due to ischemic cardiomyopathy status post CRT-D, permanent atrial fibrillation not on anticoagulation secondary to chronic anemia due to the bowel AVMs, chronic kidney disease stage 3, prostate cancer, hypertension, hyperlipidemia, and deafness.  Encounter with help of deaf interpretor today.   He was seen on consultation on 05/19/2019 due to worsening edema and dyspnea, worsening over 2 weeks prior to admission.He was hypoxic and hypotensive.Chest X-ray, demonstrated pulmonary vascular congestion with small bilateral pleural effusions and possible infiltrate at the left lung base. Echocardiogram revealed EF of 35%-40%.   He was also diagnosed with sepsis due to Group B bacteremia. He was seem by ID and placed on IV antibiotics. PICC line was placed. He was to have 4 weeks of IV antibiotics post discharge. He was restarted on po diuretics and Unna boots.  He is here today with his wife, is very frail and has lost a significant amount of weight.  He continues on antibiotic therapy through PICC line.  They are awaiting for home health nurses to see him for blood pressure checks.  He also has an infusion nurse see him as well.  He denies chest pain, dyspnea on exertion, dizziness, or palpitations.    Past Medical History:  Diagnosis Date  . AICD (automatic cardioverter/defibrillator) present   . Biventricular ICD (implantable cardioverter-defibrillator) in place 03/24/2005   Implantation of a Med[employer]onic Adapta ADDRO1, serial number [id number]  . CHF (congestive heart failure) ([place])   .  CKD (chronic kidney disease), stage III   . Coronary artery disease    a. s/p CABG 1986. b. Multiple PCIs/caths. c. 09/2013: s/p PTCA and BMS to SVG-OM.  [name]   . Dysrhythmia   . History of abdominal aortic aneurysm   . History of bleeding peptic ulcer 1980  . History of epididymitis 2013  . HTN (hypertension)   . Hydronephrosis with ureteropelvic junction obstruction   . Hydroureter on left 2009  . Hypertension   . Ischemic cardiomyopathy    a. Prior EF 30-35%, s/p BIV-ICD. b. 09/2013: EF 45-50%.  . Moderate tricuspid regurgitation   . PAF (paroxysmal atrial fibrillation) ([place])   . Presence of permanent cardiac pacemaker   . Prostate cancer ([place])   . Status post coronary artery bypass grafting 1986   LIMA to the LAD, SVG to OM, SVG to RCA  . Testicular swelling     Past Surgical History:  Procedure Laterality Date  . 2-D echocardiogram  11/20/2011   Ejection fraction 30-35% moderate concentric left ventricular hypertrophy. Left atrium is moderately dilated. Mild MR. Mild or  . BI-VENTRICULAR IMPLANTABLE CARDIOVERTER DEFIBRILLATOR N/A 12/16/2012   Procedure: BI-VENTRICULAR IMPLANTABLE CARDIOVERTER DEFIBRILLATOR  (CRT-D);  Surgeon: [name], MD;  Location: [facility] CATH LAB;  Service: Cardiovascular;  Laterality: N/A;  . CARDIAC CATHETERIZATION  12/10/2011   SVG to OM widely patent.  LIMA to LAD patent  . CATARACT EXTRACTION W/PHACO Right 10/12/2017   Procedure: CATARACT EXTRACTION PHACO AND INTRAOCULAR LENS PLACEMENT (IOC);  Surgeon: Birder Robson, MD;  Location: [facility] ORS;  Service: Ophthalmology;  Laterality: Right;  [place] 00:57 AP% 15.9 CDE 9.07 Fluid pack lot # [id number]  . COLONOSCOPY N/A 07/13/2018   Procedure: COLONOSCOPY;  Surgeon: Toledo, Benay Pike, MD;  Location: [facility] ENDOSCOPY;  Service: Gastroenterology;  Laterality: N/A;  . CORONARY ARTERY BYPASS GRAFT  1986  . ENTEROSCOPY N/A 09/14/2018   Procedure: ENTEROSCOPY;  Surgeon: Toledo, Benay Pike, MD;  Location: [facility]  ENDOSCOPY;  Service: Gastroenterology;  Laterality: N/A;  symptomatic anemia, GI blood loss anemia, melena, positive small bowel capsule endoscopy showing source of bleeding   . ESOPHAGOGASTRODUODENOSCOPY N/A 07/13/2018   Procedure: ESOPHAGOGASTRODUODENOSCOPY (EGD);  Surgeon: Toledo, Benay Pike, MD;  Location: [facility] ENDOSCOPY;  Service: Gastroenterology;  Laterality: N/A;  . ESOPHAGOGASTRODUODENOSCOPY N/A 09/14/2018   Procedure: ESOPHAGOGASTRODUODENOSCOPY (EGD);  Surgeon: Toledo, Benay Pike, MD;  Location: [facility] ENDOSCOPY;  Service: Gastroenterology;  Laterality: N/A;  SIGN LANAGUAGE INTERPRETER  . ESOPHAGOGASTRODUODENOSCOPY N/A 06/21/2019   Procedure: ESOPHAGOGASTRODUODENOSCOPY (EGD);  Surgeon: Toledo, Benay Pike, MD;  Location: [facility] ENDOSCOPY;  Service: Gastroenterology;  Laterality: N/A;  . ESOPHAGOGASTRODUODENOSCOPY (EGD) WITH PROPOFOL N/A 05/27/2018   Procedure: ESOPHAGOGASTRODUODENOSCOPY (EGD) WITH PROPOFOL;  Surgeon: Clarene Essex, MD;  Location: [facility];  Service: Endoscopy;  Laterality: N/A;  . INSERT / REPLACE / REMOVE PACEMAKER    . LEFT HEART CATHETERIZATION WITH CORONARY/GRAFT ANGIOGRAM N/A 12/10/2011   Procedure: LEFT HEART CATHETERIZATION WITH Beatrix Fetters;  Surgeon: Sanda Klein, MD;  Location: [facility] CATH LAB;  Service: Cardiovascular;  Laterality: N/A;  . LEFT HEART CATHETERIZATION WITH CORONARY/GRAFT ANGIOGRAM N/A 09/25/2013   Procedure: LEFT HEART CATHETERIZATION WITH Beatrix Fetters;  Surgeon: Blane Ohara, MD;  Location[facility] CATH LAB;  Service: Cardiovascular;  Laterality: N/A;  . Persantine Myoview  05/06/2010   Post-rest ejection fraction 30%. No significant ischemia demonstrated. Compared to previous study there is no significant change.  . TEE WITHOUT CARDIOVERSION N/A 06/22/2019   Procedure: TRANSESOPHAGEAL ECHOCARDIOGRAM (TEE);  Surgeon: Minna Merritts, MD;  Location: [facility] ORS;  Service: Cardiovascular;  Laterality: N/A;  . TRANSURETHRAL RESECTION OF  PROSTATE     s/p     Current Outpatient Medications  Medication Sig Dispense Refill  . albuterol (VENTOLIN HFA) 108 (90 Base) MCG/ACT inhaler Inhale 2 puffs into the lungs 4 (four) times daily as needed. (Patient taking differently: Inhale 2 puffs into the lungs 4 (four) times daily as needed for wheezing or shortness of breath. ) 1 Inhaler 1  . cefTRIAXone (ROCEPHIN) IVPB Inject 2 g into the vein daily for 20 days. Indication:  Group B streptococcus bacteremia Last Day of Therapy:  07/14/19 Labs - Once weekly:  CBC/D, CMP Please pull PICC at completion of IV antibiotics 20 Units 0  . CVS ACETAMINOPHEN EX ST 500 MG tablet Take 500-1,000 mg by mouth every 6 (six) hours as needed for pain or fever.    . ferrous sulfate 325 (65 FE) MG tablet Take 1 tablet (325 mg total) by mouth daily for 30 days. 30 tablet 0  . losartan (COZAAR) 25 MG tablet Take 0.5 tablets (12.5 mg total) by mouth daily. 30 tablet 3  . metolazone (ZAROXOLYN) 2.5 MG tablet Take 1 tablet (2.5 mg total) by mouth once for 1 dose. 30 mins prior to taking torsemide 1 tablet 0  . Multiple Vitamin (MULTIVITAMIN WITH MINERALS) TABS tablet Take 1 tablet by mouth daily.    . pantoprazole (PROTONIX) 40 MG tablet Take 1 tablet (40 mg total) by mouth 2 (two) times daily before a meal. 60 tablet 0  . potassium chloride SA (KLOR-CON) 20 MEQ tablet Take  1 tablet (20 mEq total) by mouth 2 (two) times daily. 60 tablet 3  . simvastatin (ZOCOR) 20 MG tablet TAKE 1 TABLET BY MOUTH EVERY DAY (Patient taking differently: Take 20 mg by mouth daily. ) 90 tablet 2  . torsemide (DEMADEX) 20 MG tablet Take 2 tablets (40 mg total) by mouth 2 (two) times daily. 120 tablet 3  . triamcinolone cream (KENALOG) 0.1 % Apply 1 application topically 2 (two) times daily as needed for itching. (avoid face, groin and axilla)     No current facility-administered medications for this visit.    Allergies:   Entresto [sacubitril-valsartan], Phenazopyridine, and  Ramipril    Social History:  The patient  reports that he quit smoking about 34 years ago. He has never used smokeless tobacco. He reports that he does not drink alcohol or use drugs.   Family History:  The patient's family history includes Hypertension in his father.    ROS: All other systems are reviewed and negative. Unless otherwise mentioned in H&P    PHYSICAL EXAM: VS:  Ht 6\' 2"  (1.88 m)   Wt 207 lb 6.4 oz (94.1 kg)   BMI 26.63 kg/m  , BMI Body mass index is 26.63 kg/m. GEN: Well nourished, well developed, in no acute distress, frail HEENT: normal Neck: no JVD, 2/6 systolic murmur heard best at the left sternal border no carotid  bruits, or masses Cardiac: RRR; no murmurs, rubs, or gallops,no edema  Respiratory:  Clear to auscultation bilaterally, normal work of breathing GI: soft, nontender, nondistended, + BS MS: no deformity or atrophy Skin: warm and dry, no rash, very pale, open sores to his left lower leg, not draining, some scratches as well from when he itches the legs.  PICC line noted in the proximal right upper arm.  No evidence of redness or signs of infection there.  Legs have woody skin thickening bilaterally.  He is not wearing Unna boots at this time. Neuro:  Strength and sensation are intact Psych: euthymic mood, full affect   EKG: Not completed this office visit.  Recent Labs: 05/19/2019: TSH 2.407 06/16/2019: B Natriuretic Peptide 1,390.0 06/23/2019: Magnesium 2.0 06/25/2019: ALT 14; BUN 18; Creatinine, Ser 1.27; Hemoglobin 7.6; Platelets 277; Potassium 4.2; Sodium 141    Lipid Panel    Component Value Date/Time   CHOL 92 11/01/2015 0331   CHOL 139 11/20/2011 0624   TRIG 76 11/01/2015 0331   TRIG 108 11/20/2011 0624   HDL 31 (L) 11/01/2015 0331   HDL 26 (L) 11/20/2011 0624   CHOLHDL 3.0 11/01/2015 0331   VLDL 15 11/01/2015 0331   VLDL 22 11/20/2011 0624   LDLCALC 46 11/01/2015 0331   LDLCALC 91 11/20/2011 0624      Wt Readings from Last 3  Encounters:  07/04/19 207 lb 6.4 oz (94.1 kg)  06/24/19 224 lb 3.3 oz (101.7 kg)  06/05/19 201 lb (91.2 kg)      Other studies Reviewed: Echocardiogram 06-25-2019  1. Left ventricular ejection fraction, by estimation, is 35 to 40%. The  left ventricle has moderately decreased function. The left ventricle has  no regional wall motion abnormalities. The left ventricular internal  cavity size was mildly to moderately  dilated. Left ventricular diastolic parameters are indeterminate.  2. Right ventricular systolic function is mildly reduced. The right  ventricular size is normal. There is moderately elevated pulmonary artery  systolic pressure. The estimated right ventricular systolic pressure is  123456 mmHg.  3. Left atrial size was severely dilated.  4. Right atrial size was moderately dilated.  5. Moderate pleural effusion in the left lateral region.  6. The mitral valve is normal in structure. Mild mitral valve  regurgitation. No evidence of mitral stenosis.  7. Tricuspid valve regurgitation is moderate.  8. The aortic valve is normal in structure. Aortic valve regurgitation is  not visualized. Mild to moderate aortic valve sclerosis/calcification is  present, without any evidence of aortic stenosis.  9. Moderately dilated pulmonary artery.  10. The inferior vena cava is dilated in size with <50% respiratory  variability, suggesting right atrial pressure of 15 mmHg.  11. No clear vegetation but the RV pacemaker lead appears thinkened. A  vegetation can not be excluded. Consider TEE.    ASSESSMENT AND PLAN:  1.  Chronic ischemic cardiomyopathy: He is currently on torsemide 40 mg twice daily, potassium replacement, metolazone 2.5 mg as needed fluid retention.  He does not appear fluid overloaded at this time.  He does have woody edema in the lower extremities but this does not appear to be significant for decompensation.  He has had recent labs completed I will request these  for evaluation.  If we do not receive them I will repeat his BMET and BNP prior to next appointment.  He is to follow-up in 1 month with Dr. Sallyanne Kuster   2.  Hypertension: Blood pressure is low normal for his reduced EF.  I will not make any changes in his regimen at this time as he is tolerating hypotension without dizziness, fatigue, or near syncope.  He will need to have follow-up BMET if not already completed by PCP.  3.  AICD in situ.  Followed by pacemaker clinic per protocol..   4.  Sepsis: Being followed by ID and infusion nurse who comes to his home for antibiotic therapy IV.  Defer to ID for ongoing management.    Current medicines are reviewed at length with the patient today.  I have spent 25 minutes dedicated to the care of this patient on the date of this encounter to include pre-visit review of records, assessment, management and diagnostic testing,with shared decision making.  Labs/ tests ordered today include: None.  (BMET and CBC if records do not indicate they have been done recently) Thomas Myron. [place] Pugh, ANP, AACC   07/04/2019 3:38 PM    [facility] [place] [address] Office [phone] Fax [phone]  Notice: This dictation was prepared with Dragon dictation along with smaller phrase technology. Any transcriptional errors that result from this process are unintentional and may not be corrected upon review.

## 2019-07-04 NOTE — Patient Instructions (Signed)
Medication Instructions:  Continue current medications  *If you need a refill on your cardiac medications before your next appointment, please call your pharmacy*   Lab Work: None Ordered   Testing/Procedures: None Ordered   Follow-Up: At [facility], you and your health needs are our priority.  As part of our continuing mission to provide you with exceptional heart care, we have created designated Provider Care Teams.  These Care Teams include your primary Cardiologist (physician) and Advanced Practice Providers (APPs -  Physician Assistants and Nurse Practitioners) who all work together to provide you with the care you need, when you need it.  We recommend signing up for the patient portal called "MyChart".  Sign up information is provided on this After Visit Summary.  MyChart is used to connect with patients for Virtual Visits (Telemedicine).  Patients are able to view lab/test results, encounter notes, upcoming appointments, etc.  Non-urgent messages can be sent to your provider as well.   To learn more about what you can do with MyChart, go to [email or web address].    Your next appointment:   1 month(s)  The format for your next appointment:   In Person  Provider:   Sanda Klein, MD

## 2019-07-06 DIAGNOSIS — R7881 Bacteremia: Secondary | ICD-10-CM | POA: Diagnosis not present

## 2019-07-10 DIAGNOSIS — I890 Lymphedema, not elsewhere classified: Secondary | ICD-10-CM | POA: Diagnosis not present

## 2019-07-10 DIAGNOSIS — H9193 Unspecified hearing loss, bilateral: Secondary | ICD-10-CM | POA: Diagnosis not present

## 2019-07-10 DIAGNOSIS — A401 Sepsis due to streptococcus, group B: Secondary | ICD-10-CM | POA: Diagnosis not present

## 2019-07-10 DIAGNOSIS — I2510 Atherosclerotic heart disease of native coronary artery without angina pectoris: Secondary | ICD-10-CM | POA: Diagnosis not present

## 2019-07-10 DIAGNOSIS — I4821 Permanent atrial fibrillation: Secondary | ICD-10-CM | POA: Diagnosis not present

## 2019-07-10 DIAGNOSIS — I255 Ischemic cardiomyopathy: Secondary | ICD-10-CM | POA: Diagnosis not present

## 2019-07-10 DIAGNOSIS — Z95810 Presence of automatic (implantable) cardiac defibrillator: Secondary | ICD-10-CM | POA: Diagnosis not present

## 2019-07-10 DIAGNOSIS — Z8673 Personal history of transient ischemic attack (TIA), and cerebral infarction without residual deficits: Secondary | ICD-10-CM | POA: Diagnosis not present

## 2019-07-10 DIAGNOSIS — E7800 Pure hypercholesterolemia, unspecified: Secondary | ICD-10-CM | POA: Diagnosis not present

## 2019-07-10 DIAGNOSIS — I252 Old myocardial infarction: Secondary | ICD-10-CM | POA: Diagnosis not present

## 2019-07-10 DIAGNOSIS — D631 Anemia in chronic kidney disease: Secondary | ICD-10-CM | POA: Diagnosis not present

## 2019-07-10 DIAGNOSIS — I110 Hypertensive heart disease with heart failure: Secondary | ICD-10-CM | POA: Diagnosis not present

## 2019-07-10 DIAGNOSIS — Z951 Presence of aortocoronary bypass graft: Secondary | ICD-10-CM | POA: Diagnosis not present

## 2019-07-10 DIAGNOSIS — I5042 Chronic combined systolic (congestive) and diastolic (congestive) heart failure: Secondary | ICD-10-CM | POA: Diagnosis not present

## 2019-07-10 DIAGNOSIS — I441 Atrioventricular block, second degree: Secondary | ICD-10-CM | POA: Diagnosis not present

## 2019-07-10 DIAGNOSIS — I499 Cardiac arrhythmia, unspecified: Secondary | ICD-10-CM | POA: Diagnosis not present

## 2019-07-10 DIAGNOSIS — Z9181 History of falling: Secondary | ICD-10-CM | POA: Diagnosis not present

## 2019-07-10 DIAGNOSIS — K219 Gastro-esophageal reflux disease without esophagitis: Secondary | ICD-10-CM | POA: Diagnosis not present

## 2019-07-10 DIAGNOSIS — Z87891 Personal history of nicotine dependence: Secondary | ICD-10-CM | POA: Diagnosis not present

## 2019-07-10 DIAGNOSIS — Z8546 Personal history of malignant neoplasm of prostate: Secondary | ICD-10-CM | POA: Diagnosis not present

## 2019-07-10 DIAGNOSIS — Z950 Presence of cardiac pacemaker: Secondary | ICD-10-CM | POA: Diagnosis not present

## 2019-07-10 DIAGNOSIS — N183 Chronic kidney disease, stage 3 unspecified: Secondary | ICD-10-CM | POA: Diagnosis not present

## 2019-07-10 DIAGNOSIS — I071 Rheumatic tricuspid insufficiency: Secondary | ICD-10-CM | POA: Diagnosis not present

## 2019-07-10 DIAGNOSIS — Z7951 Long term (current) use of inhaled steroids: Secondary | ICD-10-CM | POA: Diagnosis not present

## 2019-07-13 ENCOUNTER — Telehealth: Payer: Self-pay

## 2019-07-13 ENCOUNTER — Ambulatory Visit (INDEPENDENT_AMBULATORY_CARE_PROVIDER_SITE_OTHER): Payer: PPO | Admitting: *Deleted

## 2019-07-13 ENCOUNTER — Ambulatory Visit: Payer: PPO | Admitting: Physician Assistant

## 2019-07-13 DIAGNOSIS — I255 Ischemic cardiomyopathy: Secondary | ICD-10-CM

## 2019-07-13 LAB — CUP PACEART REMOTE DEVICE CHECK
Battery Remaining Longevity: 3 mo
Battery Voltage: 2.74 V
Brady Statistic AP VP Percent: 0 %
Brady Statistic AP VS Percent: 0 %
Brady Statistic AS VP Percent: 92.74 %
Brady Statistic AS VS Percent: 7.26 %
Brady Statistic RA Percent Paced: 0 %
Brady Statistic RV Percent Paced: 94.13 %
Date Time Interrogation Session: 20210429133827
HighPow Impedance: 51 Ohm
Implantable Lead Implant Date: 20000210
Implantable Lead Implant Date: 20000210
Implantable Lead Implant Date: 20141003
Implantable Lead Location: 753858
Implantable Lead Location: 753859
Implantable Lead Location: 753860
Implantable Lead Model: 4244
Implantable Lead Model: 4285
Implantable Lead Model: 4298
Implantable Lead Serial Number: 272469
Implantable Lead Serial Number: 413633
Implantable Pulse Generator Implant Date: 20141003
Lead Channel Impedance Value: 228 Ohm
Lead Channel Impedance Value: 285 Ohm
Lead Channel Impedance Value: 285 Ohm
Lead Channel Impedance Value: 323 Ohm
Lead Channel Impedance Value: 323 Ohm
Lead Channel Impedance Value: 380 Ohm
Lead Channel Impedance Value: 399 Ohm
Lead Channel Impedance Value: 399 Ohm
Lead Channel Impedance Value: 494 Ohm
Lead Channel Impedance Value: 494 Ohm
Lead Channel Impedance Value: 494 Ohm
Lead Channel Impedance Value: 513 Ohm
Lead Channel Impedance Value: 513 Ohm
Lead Channel Pacing Threshold Amplitude: 0.75 V
Lead Channel Pacing Threshold Amplitude: 1.5 V
Lead Channel Pacing Threshold Pulse Width: 0.4 ms
Lead Channel Pacing Threshold Pulse Width: 0.6 ms
Lead Channel Sensing Intrinsic Amplitude: 0.875 mV
Lead Channel Sensing Intrinsic Amplitude: 0.875 mV
Lead Channel Sensing Intrinsic Amplitude: 9.375 mV
Lead Channel Sensing Intrinsic Amplitude: 9.375 mV
Lead Channel Setting Pacing Amplitude: 2 V
Lead Channel Setting Pacing Amplitude: 2.5 V
Lead Channel Setting Pacing Pulse Width: 0.4 ms
Lead Channel Setting Pacing Pulse Width: 0.6 ms
Lead Channel Setting Sensing Sensitivity: 0.3 mV

## 2019-07-13 NOTE — Telephone Encounter (Signed)
Called to give orders for D/C PICC upon completing IV Abx as per Dr. [name]. Spoke to nurse Bonnita Nasuti at [facility],. She advised that she has been taking orders from PCP -[facility]. She called them to report that patients family neglected to give  two doses and the nurse from PCP called and went over what to do and when to D/C PICC> Nurse is on way to check on PICC line now but will continue to report to PCP who also has been getting all labs and handling this care. I exlained that ID usually has to do orders for PICC care and Abx but that I will report this to Ravishankar and no other communicating is necessary since PCP already did all labs and orders.

## 2019-07-14 ENCOUNTER — Emergency Department
Admission: EM | Admit: 2019-07-14 | Discharge: 2019-07-14 | Disposition: A | Discharge status: Home / Self Care | Payer: PPO | Attending: Emergency Medicine | Admitting: Emergency Medicine

## 2019-07-14 ENCOUNTER — Emergency Department: Payer: PPO

## 2019-07-14 ENCOUNTER — Encounter: Payer: Self-pay | Admitting: Emergency Medicine

## 2019-07-14 ENCOUNTER — Other Ambulatory Visit: Payer: Self-pay

## 2019-07-14 ENCOUNTER — Other Ambulatory Visit: Payer: Self-pay | Admitting: *Deleted

## 2019-07-14 DIAGNOSIS — I129 Hypertensive chronic kidney disease with stage 1 through stage 4 chronic kidney disease, or unspecified chronic kidney disease: Secondary | ICD-10-CM | POA: Diagnosis not present

## 2019-07-14 DIAGNOSIS — N183 Chronic kidney disease, stage 3 unspecified: Secondary | ICD-10-CM | POA: Insufficient documentation

## 2019-07-14 DIAGNOSIS — R609 Edema, unspecified: Secondary | ICD-10-CM | POA: Diagnosis not present

## 2019-07-14 DIAGNOSIS — Z87891 Personal history of nicotine dependence: Secondary | ICD-10-CM | POA: Diagnosis not present

## 2019-07-14 DIAGNOSIS — R0602 Shortness of breath: Secondary | ICD-10-CM | POA: Insufficient documentation

## 2019-07-14 DIAGNOSIS — Z951 Presence of aortocoronary bypass graft: Secondary | ICD-10-CM | POA: Insufficient documentation

## 2019-07-14 DIAGNOSIS — Z95810 Presence of automatic (implantable) cardiac defibrillator: Secondary | ICD-10-CM | POA: Diagnosis not present

## 2019-07-14 DIAGNOSIS — I2510 Atherosclerotic heart disease of native coronary artery without angina pectoris: Secondary | ICD-10-CM | POA: Diagnosis not present

## 2019-07-14 DIAGNOSIS — R2243 Localized swelling, mass and lump, lower limb, bilateral: Secondary | ICD-10-CM | POA: Diagnosis present

## 2019-07-14 DIAGNOSIS — Z79899 Other long term (current) drug therapy: Secondary | ICD-10-CM | POA: Insufficient documentation

## 2019-07-14 DIAGNOSIS — I5022 Chronic systolic (congestive) heart failure: Secondary | ICD-10-CM

## 2019-07-14 DIAGNOSIS — R600 Localized edema: Secondary | ICD-10-CM | POA: Diagnosis not present

## 2019-07-14 LAB — CBC
HCT: 26.7 % — ABNORMAL LOW (ref 39.0–52.0)
Hemoglobin: 8.1 g/dL — ABNORMAL LOW (ref 13.0–17.0)
MCH: 23.9 pg — ABNORMAL LOW (ref 26.0–34.0)
MCHC: 30.3 g/dL (ref 30.0–36.0)
MCV: 78.8 fL — ABNORMAL LOW (ref 80.0–100.0)
Platelets: 241 10*3/uL (ref 150–400)
RBC: 3.39 MIL/uL — ABNORMAL LOW (ref 4.22–5.81)
RDW: 21.1 % — ABNORMAL HIGH (ref 11.5–15.5)
WBC: 7.5 10*3/uL (ref 4.0–10.5)
nRBC: 0 % (ref 0.0–0.2)

## 2019-07-14 LAB — BRAIN NATRIURETIC PEPTIDE: B Natriuretic Peptide: 298 pg/mL — ABNORMAL HIGH (ref 0.0–100.0)

## 2019-07-14 LAB — BASIC METABOLIC PANEL
Anion gap: 8 (ref 5–15)
BUN: 29 mg/dL — ABNORMAL HIGH (ref 8–23)
CO2: 31 mmol/L (ref 22–32)
Calcium: 8.2 mg/dL — ABNORMAL LOW (ref 8.9–10.3)
Chloride: 103 mmol/L (ref 98–111)
Creatinine, Ser: 1.24 mg/dL (ref 0.61–1.24)
GFR calc Af Amer: >60 mL/min (ref >60)
GFR calc non Af Amer: 54 mL/min — ABNORMAL LOW (ref >60)
Glucose, Bld: 74 mg/dL (ref 70–99)
Potassium: 3.9 mmol/L (ref 3.5–5.1)
Sodium: 142 mmol/L (ref 135–145)

## 2019-07-14 MED ORDER — METOLAZONE 2.5 MG PO TABS: 2.5000 mg | ORAL_TABLET | Freq: Once | ORAL | 0 refills | Status: DC

## 2019-07-14 NOTE — ED Triage Notes (Signed)
Pt presents to ED via POV with spouse who reports increasing leg swelling for the last 2 wks. Has been taking torsemide as ordered. Hx CHF. Denies SOB.

## 2019-07-14 NOTE — Discharge Instructions (Signed)
As we discussed: Please take a metolazone 30 minutes prior to your morning dose of torsemide.  Take your evening dose of torsemide as usual.  Take the metolazone tomorrow morning , Sunday morning , and Monday morning.  Please also take an extra potassium pill on the days when you take a metolazone (tomorrow morning, Sunday morning and Monday morning)  In the future you can take the metolazone 30 minutes prior to torsemide if you feel that your fluid is building up significantly.  This should be used sparingly, no more than once or twice a week.  You also need to take an additional potassium pill if you take a dose of metolazone.

## 2019-07-14 NOTE — ED Notes (Signed)
DC instructions gone over explicitely w/ assistance from [place] interpreter.

## 2019-07-14 NOTE — ED Provider Notes (Signed)
[facility] Emergency Department Provider Note   ____________________________________________    I have reviewed the triage vital signs and the nursing notes.   HISTORY  Chief Complaint Leg Swelling  American sign language interpreter used.   HPI Thomas [name] is a 85 y.o. male with a history of CHF, CKD, CAD, AICD, paroxysmal atrial fibrillation who presents with bilateral lower extremity edema which has worsened over the last week.  Patient reports recent admission to the hospital for edema that was much worse than today and he wants to avoid admission.  Has been taking his torsemide 40 mg twice daily as prescribed.  Denies shortness of breath.  No chest pain.  No fevers or chills.  No pleurisy.  Past Medical History:  Diagnosis Date  . AICD (automatic cardioverter/defibrillator) present   . Biventricular ICD (implantable cardioverter-defibrillator) in place 03/24/2005   Implantation of a Med[employer]onic Adapta ADDRO1, serial number [id number]  . CHF (congestive heart failure) ([place])   . CKD (chronic kidney disease), stage III   . Coronary artery disease    a. s/p CABG 1986. b. Multiple PCIs/caths. c. 09/2013: s/p PTCA and BMS to SVG-OM.  [name]   . Dysrhythmia   . History of abdominal aortic aneurysm   . History of bleeding peptic ulcer 1980  . History of epididymitis 2013  . HTN (hypertension)   . Hydronephrosis with ureteropelvic junction obstruction   . Hydroureter on left 2009  . Hypertension   . Ischemic cardiomyopathy    a. Prior EF 30-35%, s/p BIV-ICD. b. 09/2013: EF 45-50%.  . Moderate tricuspid regurgitation   . PAF (paroxysmal atrial fibrillation) ([place])   . Presence of permanent cardiac pacemaker   . Prostate cancer ([place])   . Status post coronary artery bypass grafting 1986   LIMA to the LAD, SVG to OM, SVG to RCA  . Testicular swelling     Patient Active Problem List   Diagnosis Date Noted  . Group B streptococcal bacteriuria  06/18/2019  . AKI (acute kidney injury) ([place]) 06/18/2019  . Lactic acidosis 06/18/2019  . Pressure ulcer, stage 2 ([place]) 06/18/2019  . Bacteremia 06/17/2019  . SIRS (systemic inflammatory response syndrome) ([place]) 06/16/2019  . Leg swelling   . Decompensated hepatic cirrhosis ([place]) 05/22/2019  . Anasarca 05/19/2019  . CRI (chronic renal insufficiency), stage 3 (moderate) 01/30/2019  . Iron deficiency anemia due to chronic blood loss 11/02/2018  . Coronary artery disease of bypass graft of native heart with stable angina pectoris ([place]) 11/02/2018  . Pure hypercholesterolemia 11/02/2018  . Biventricular automatic implantable cardioverter defibrillator in situ 11/02/2018  . GIB (gastrointestinal bleeding) 09/14/2018  . Hypotension 08/14/2018  . Acute on chronic systolic CHF (congestive heart failure) ([place]) 08/12/2018  . Acute on chronic combined systolic and diastolic CHF (congestive heart failure) ([place])   . GI bleed 05/26/2018  . Occult GI bleeding 05/25/2018  . Normocytic anemia 05/25/2018  . Elevated troponin 05/25/2018  . Anemia   . Lymphedema 02/28/2018  . Weakness 07/15/2016  . Fatigue 07/15/2016  . CVA (cerebral infarction) 10/31/2015  . Bulbous urethral stricture 09/18/2015  . Bilateral deafness 08/19/2015  . Bilateral cataracts 08/19/2015  . Acid reflux 08/19/2015  . HLD (hyperlipidemia) 08/19/2015  . Essential hypertension 08/19/2015  . Myocardial infarction ([place]) 08/19/2015  . Calculus of kidney 08/19/2015  . Artificial cardiac pacemaker 08/19/2015  . Gastroduodenal ulcer 08/19/2015  . Dupuytren's contracture of foot 08/19/2015  . Malignant neoplasm of prostate ([place]) 08/19/2015  .  Microhematuria 08/19/2015  . CAD S/P multiple PCIs 02/22/2015  . Status post coronary artery bypass grafting 02/22/2015  . Benign essential HTN 04/02/2014  . Hematochezia 02/20/2014  . Tricuspid valve regurgitation, nonrheumatic 02/20/2014  . Mobitz type II atrioventricular block  12/12/2013  . Long term current use of anticoagulant 10/18/2013  . Cardiomyopathy, ischemic 11/11/2012  . CAD Status post coronary artery bypass grafting: 1995 11/11/2012  . Permanent atrial fibrillation ([place]) 11/11/2012  . Biventricular ICD in place 11/11/2012  . Chronic combined systolic and diastolic heart failure ([place]) 11/11/2012    Past Surgical History:  Procedure Laterality Date  . 2-D echocardiogram  11/20/2011   Ejection fraction 30-35% moderate concentric left ventricular hypertrophy. Left atrium is moderately dilated. Mild MR. Mild or  . BI-VENTRICULAR IMPLANTABLE CARDIOVERTER DEFIBRILLATOR N/A 12/16/2012   Procedure: BI-VENTRICULAR IMPLANTABLE CARDIOVERTER DEFIBRILLATOR  (CRT-D);  Surgeon: [name], MD;  Location: [facility] CATH LAB;  Service: Cardiovascular;  Laterality: N/A;  . CARDIAC CATHETERIZATION  12/10/2011   SVG to OM widely patent.  LIMA to LAD patent  . CATARACT EXTRACTION W/PHACO Right 10/12/2017   Procedure: CATARACT EXTRACTION PHACO AND INTRAOCULAR LENS PLACEMENT (IOC);  Surgeon: Birder Robson, MD;  Location: [facility] ORS;  Service: Ophthalmology;  Laterality: Right;  [place] 00:57 AP% 15.9 CDE 9.07 Fluid pack lot # [id number]  . COLONOSCOPY N/A 07/13/2018   Procedure: COLONOSCOPY;  Surgeon: Toledo, Benay Pike, MD;  Location: [facility] ENDOSCOPY;  Service: Gastroenterology;  Laterality: N/A;  . CORONARY ARTERY BYPASS GRAFT  1986  . ENTEROSCOPY N/A 09/14/2018   Procedure: ENTEROSCOPY;  Surgeon: Toledo, Benay Pike, MD;  Location: [facility] ENDOSCOPY;  Service: Gastroenterology;  Laterality: N/A;  symptomatic anemia, GI blood loss anemia, melena, positive small bowel capsule endoscopy showing source of bleeding   . ESOPHAGOGASTRODUODENOSCOPY N/A 07/13/2018   Procedure: ESOPHAGOGASTRODUODENOSCOPY (EGD);  Surgeon: Toledo, Benay Pike, MD;  Location: [facility] ENDOSCOPY;  Service: Gastroenterology;  Laterality: N/A;  . ESOPHAGOGASTRODUODENOSCOPY N/A 09/14/2018   Procedure: ESOPHAGOGASTRODUODENOSCOPY  (EGD);  Surgeon: Toledo, Benay Pike, MD;  Location: [facility] ENDOSCOPY;  Service: Gastroenterology;  Laterality: N/A;  SIGN LANAGUAGE INTERPRETER  . ESOPHAGOGASTRODUODENOSCOPY N/A 06/21/2019   Procedure: ESOPHAGOGASTRODUODENOSCOPY (EGD);  Surgeon: Toledo, Benay Pike, MD;  Location: [facility] ENDOSCOPY;  Service: Gastroenterology;  Laterality: N/A;  . ESOPHAGOGASTRODUODENOSCOPY (EGD) WITH PROPOFOL N/A 05/27/2018   Procedure: ESOPHAGOGASTRODUODENOSCOPY (EGD) WITH PROPOFOL;  Surgeon: Clarene Essex, MD;  Location: [place];  Service: Endoscopy;  Laterality: N/A;  . INSERT / REPLACE / REMOVE PACEMAKER    . LEFT HEART CATHETERIZATION WITH CORONARY/GRAFT ANGIOGRAM N/A 12/10/2011   Procedure: LEFT HEART CATHETERIZATION WITH Beatrix Fetters;  Surgeon: Sanda Klein, MD;  Location: [place] CATH LAB;  Service: Cardiovascular;  Laterality: N/A;  . LEFT HEART CATHETERIZATION WITH CORONARY/GRAFT ANGIOGRAM N/A 09/25/2013   Procedure: LEFT HEART CATHETERIZATION WITH Beatrix Fetters;  Surgeon: Blane Ohara, MD;  Location: [facility] CATH LAB;  Service: Cardiovascular;  Laterality: N/A;  . Persantine Myoview  05/06/2010   Post-rest ejection fraction 30%. No significant ischemia demonstrated. Compared to previous study there is no significant change.  . TEE WITHOUT CARDIOVERSION N/A 06/22/2019   Procedure: TRANSESOPHAGEAL ECHOCARDIOGRAM (TEE);  Surgeon: Minna Merritts, MD;  Location: [facility] ORS;  Service: Cardiovascular;  Laterality: N/A;  . TRANSURETHRAL RESECTION OF PROSTATE     s/p    Prior to Admission medications   Medication Sig Start Date End Date Taking? Authorizing Provider  albuterol (VENTOLIN HFA) 108 (90 Base) MCG/ACT inhaler Inhale 2 puffs into the lungs 4 (four) times daily as needed.  Patient taking differently: Inhale 2 puffs into the lungs 4 (four) times daily as needed for wheezing or shortness of breath.  08/02/18   Croitoru, Mihai, MD  cefTRIAXone (ROCEPHIN) IVPB Inject 2 g into the vein daily for 20  days. Indication:  Group B streptococcus bacteremia Last Day of Therapy:  07/14/19 Labs - Once weekly:  CBC/D, CMP Please pull PICC at completion of IV antibiotics 06/25/19 07/15/19  Danford, Suann Larry, MD  [employer] ACETAMINOPHEN EX ST 500 MG tablet Take 500-1,000 mg by mouth every 6 (six) hours as needed for pain or fever. 04/26/19   [provider]  ferrous sulfate 325 (65 FE) MG tablet Take 1 tablet (325 mg total) by mouth daily for 30 days. 05/28/18   Elodia Florence., MD  losartan (COZAAR) 25 MG tablet Take 0.5 tablets (12.5 mg total) by mouth daily. 06/26/19   Danford, Suann Larry, MD  metolazone (ZAROXOLYN) 2.5 MG tablet Take 1 tablet (2.5 mg total) by mouth once for 1 dose. 30 mins prior to taking torsemide 07/14/19 07/14/19  Lavonia Drafts, MD  Multiple Vitamin (MULTIVITAMIN WITH MINERALS) TABS tablet Take 1 tablet by mouth daily. 05/29/19   Ezekiel Slocumb, DO  pantoprazole (PROTONIX) 40 MG tablet Take 1 tablet (40 mg total) by mouth 2 (two) times daily before a meal. 06/25/19   Danford, Suann Larry, MD  potassium chloride SA (KLOR-CON) 20 MEQ tablet Take 1 tablet (20 mEq total) by mouth 2 (two) times daily. 06/25/19   Danford, Suann Larry, MD  simvastatin (ZOCOR) 20 MG tablet TAKE 1 TABLET BY MOUTH EVERY DAY Patient taking differently: Take 20 mg by mouth daily.  04/07/19   Erlene Quan, PA-C  torsemide (DEMADEX) 20 MG tablet Take 2 tablets (40 mg total) by mouth 2 (two) times daily. 06/25/19   Danford, Suann Larry, MD  triamcinolone cream (KENALOG) 0.1 % Apply 1 application topically 2 (two) times daily as needed for itching. (avoid face, groin and axilla) 04/17/19   [provider]     Allergies Entresto [sacubitril-valsartan], Phenazopyridine, and Ramipril  Family History  Problem Relation Age of Onset  . Hypertension Father     Social History Social History   Tobacco Use  . Smoking status: Former Smoker    Quit date: 03/15/1985    Years since  quitting: 34.3  . Smokeless tobacco: Never Used  Substance Use Topics  . Alcohol use: No    Comment: occas.  . Drug use: No    Review of Systems  Constitutional: No reported fever/chills Eyes: No visual changes.  ENT: No reported sore throat. Cardiovascular: No chest pain  Respiratory: No shortness of Gastrointestinal: No abdominal pain.     Genitourinary: Negative for dysuria. Musculoskeletal: As above Skin: Negative for rash. Neurological: No deficits   ____________________________________________   PHYSICAL EXAM:  VITAL SIGNS: ED Triage Vitals [07/14/19 1347]  Enc Vitals Group     BP (!) 95/47     Pulse Rate 63     Resp 20     Temp 98.3 F (36.8 C)     Temp src      SpO2 96 %     Weight 92.5 kg (204 lb)     Height 1.88 m (6\' 2" )     Head Circumference      Peak Flow      Pain Score 0     Pain Loc      Pain Edu?      Excl. in [place]?  Constitutional: Alert and oriented per interpreter. No acute distress. Eyes: Conjunctivae are normal.  Head: Atraumatic. Nose: No congestion/rhinnorhea. Mouth/Throat: Mucous membranes are moist.    Cardiovascular: Normal rate, regular rhythm.  Good peripheral circulation. Respiratory: Normal respiratory effort.  No retractions. Gastrointestinal: Soft and nontender. No distention.   Genitourinary: deferred Musculoskeletal: 2+ bilateral edema to the level of the knee, warm and well perfused, no erythema or cellulitis.  Some weeping of serous fluid noted Neurologic:  Normal speech and language. No gross focal neurologic deficits are appreciated.  Skin:  Skin is warm, dry Psychiatric: Mood and affect are normal. Speech and behavior are normal.  ____________________________________________   LABS (all labs ordered are listed, but only abnormal results are displayed)  Labs Reviewed  BASIC METABOLIC PANEL - Abnormal; Notable for the following components:      Result Value   BUN 29 (*)    Calcium 8.2 (*)    GFR calc non  Af Amer 54 (*)    All other components within normal limits  CBC - Abnormal; Notable for the following components:   RBC 3.39 (*)    Hemoglobin 8.1 (*)    HCT 26.7 (*)    MCV 78.8 (*)    MCH 23.9 (*)    RDW 21.1 (*)    All other components within normal limits  BRAIN NATRIURETIC PEPTIDE - Abnormal; Notable for the following components:   B Natriuretic Peptide 298.0 (*)    All other components within normal limits   ____________________________________________  EKG  None ____________________________________________  RADIOLOGY  None ____________________________________________   PROCEDURES  Procedure(s) performed: No  Procedures   Critical Care performed: No ____________________________________________   INITIAL IMPRESSION / ASSESSMENT AND PLAN / ED COURSE  Pertinent labs & imaging results that were available during my care of the patient were reviewed by me and considered in my medical decision making (see chart for details).  Patient presents with bilateral lower extremity edema which has worsened over the last week.  No shortness of breath and chest x-ray is quite reassuring.  This is consistent with worsening CHF/fluid accumulation.  Patient does have a history of anemia which is certainly making this worse.  Lab work demonstrates a BNP of 298 which is significantly improved from prior hospitalization.  No evidence of cellulitis.  Discussed at length with Dr. Rockey Situ of cardiology who recommends metolazone prior to torsemide 3 days in a row with an extra potassium.  Explained this using interpreter and wrote specific instructions as well.  Patient will follow up with the CHF clinic next week to ensure appropriate diuresis   ____________________________________________   FINAL CLINICAL IMPRESSION(S) / ED DIAGNOSES  Final diagnoses:  Peripheral edema        Note:  This document was prepared using Dragon voice recognition software and may include unintentional  dictation errors.   Lavonia Drafts, MD 07/14/19 [phone]

## 2019-07-14 NOTE — Telephone Encounter (Signed)
Agree. thanks

## 2019-07-14 NOTE — ED Notes (Signed)
Pt and spouse are both Deaf and use ASL. Triage done via video interpreter [phone]

## 2019-07-14 NOTE — ED Notes (Addendum)
ASL interpreter requested  Brought over from [facility] for bilateral lower leg swelling with wife. Pt in NAD, RR even and unlabored.

## 2019-07-17 DIAGNOSIS — Z950 Presence of cardiac pacemaker: Secondary | ICD-10-CM | POA: Diagnosis not present

## 2019-07-17 DIAGNOSIS — E7800 Pure hypercholesterolemia, unspecified: Secondary | ICD-10-CM | POA: Diagnosis not present

## 2019-07-17 DIAGNOSIS — I890 Lymphedema, not elsewhere classified: Secondary | ICD-10-CM | POA: Diagnosis not present

## 2019-07-17 DIAGNOSIS — K219 Gastro-esophageal reflux disease without esophagitis: Secondary | ICD-10-CM | POA: Diagnosis not present

## 2019-07-17 DIAGNOSIS — I110 Hypertensive heart disease with heart failure: Secondary | ICD-10-CM | POA: Diagnosis not present

## 2019-07-17 DIAGNOSIS — Z95810 Presence of automatic (implantable) cardiac defibrillator: Secondary | ICD-10-CM | POA: Diagnosis not present

## 2019-07-17 DIAGNOSIS — I5042 Chronic combined systolic (congestive) and diastolic (congestive) heart failure: Secondary | ICD-10-CM | POA: Diagnosis not present

## 2019-07-17 DIAGNOSIS — Z8546 Personal history of malignant neoplasm of prostate: Secondary | ICD-10-CM | POA: Diagnosis not present

## 2019-07-17 DIAGNOSIS — I252 Old myocardial infarction: Secondary | ICD-10-CM | POA: Diagnosis not present

## 2019-07-17 DIAGNOSIS — H9193 Unspecified hearing loss, bilateral: Secondary | ICD-10-CM | POA: Diagnosis not present

## 2019-07-17 DIAGNOSIS — Z951 Presence of aortocoronary bypass graft: Secondary | ICD-10-CM | POA: Diagnosis not present

## 2019-07-17 DIAGNOSIS — I2510 Atherosclerotic heart disease of native coronary artery without angina pectoris: Secondary | ICD-10-CM | POA: Diagnosis not present

## 2019-07-17 DIAGNOSIS — I441 Atrioventricular block, second degree: Secondary | ICD-10-CM | POA: Diagnosis not present

## 2019-07-17 DIAGNOSIS — Z7951 Long term (current) use of inhaled steroids: Secondary | ICD-10-CM | POA: Diagnosis not present

## 2019-07-17 DIAGNOSIS — D631 Anemia in chronic kidney disease: Secondary | ICD-10-CM | POA: Diagnosis not present

## 2019-07-17 DIAGNOSIS — I4821 Permanent atrial fibrillation: Secondary | ICD-10-CM | POA: Diagnosis not present

## 2019-07-17 DIAGNOSIS — N183 Chronic kidney disease, stage 3 unspecified: Secondary | ICD-10-CM | POA: Diagnosis not present

## 2019-07-17 DIAGNOSIS — Z87891 Personal history of nicotine dependence: Secondary | ICD-10-CM | POA: Diagnosis not present

## 2019-07-17 DIAGNOSIS — Z8673 Personal history of transient ischemic attack (TIA), and cerebral infarction without residual deficits: Secondary | ICD-10-CM | POA: Diagnosis not present

## 2019-07-17 DIAGNOSIS — I499 Cardiac arrhythmia, unspecified: Secondary | ICD-10-CM | POA: Diagnosis not present

## 2019-07-17 DIAGNOSIS — I255 Ischemic cardiomyopathy: Secondary | ICD-10-CM | POA: Diagnosis not present

## 2019-07-17 DIAGNOSIS — Z9181 History of falling: Secondary | ICD-10-CM | POA: Diagnosis not present

## 2019-07-17 DIAGNOSIS — I071 Rheumatic tricuspid insufficiency: Secondary | ICD-10-CM | POA: Diagnosis not present

## 2019-07-20 DIAGNOSIS — H9193 Unspecified hearing loss, bilateral: Secondary | ICD-10-CM | POA: Diagnosis not present

## 2019-07-20 DIAGNOSIS — I071 Rheumatic tricuspid insufficiency: Secondary | ICD-10-CM | POA: Diagnosis not present

## 2019-07-20 DIAGNOSIS — I499 Cardiac arrhythmia, unspecified: Secondary | ICD-10-CM | POA: Diagnosis not present

## 2019-07-20 DIAGNOSIS — I5042 Chronic combined systolic (congestive) and diastolic (congestive) heart failure: Secondary | ICD-10-CM | POA: Diagnosis not present

## 2019-07-20 DIAGNOSIS — I252 Old myocardial infarction: Secondary | ICD-10-CM | POA: Diagnosis not present

## 2019-07-20 DIAGNOSIS — Z95810 Presence of automatic (implantable) cardiac defibrillator: Secondary | ICD-10-CM | POA: Diagnosis not present

## 2019-07-20 DIAGNOSIS — E7800 Pure hypercholesterolemia, unspecified: Secondary | ICD-10-CM | POA: Diagnosis not present

## 2019-07-20 DIAGNOSIS — I890 Lymphedema, not elsewhere classified: Secondary | ICD-10-CM | POA: Diagnosis not present

## 2019-07-20 DIAGNOSIS — Z950 Presence of cardiac pacemaker: Secondary | ICD-10-CM | POA: Diagnosis not present

## 2019-07-20 DIAGNOSIS — Z87891 Personal history of nicotine dependence: Secondary | ICD-10-CM | POA: Diagnosis not present

## 2019-07-20 DIAGNOSIS — Z951 Presence of aortocoronary bypass graft: Secondary | ICD-10-CM | POA: Diagnosis not present

## 2019-07-20 DIAGNOSIS — I110 Hypertensive heart disease with heart failure: Secondary | ICD-10-CM | POA: Diagnosis not present

## 2019-07-20 DIAGNOSIS — N183 Chronic kidney disease, stage 3 unspecified: Secondary | ICD-10-CM | POA: Diagnosis not present

## 2019-07-20 DIAGNOSIS — I441 Atrioventricular block, second degree: Secondary | ICD-10-CM | POA: Diagnosis not present

## 2019-07-20 DIAGNOSIS — I2510 Atherosclerotic heart disease of native coronary artery without angina pectoris: Secondary | ICD-10-CM | POA: Diagnosis not present

## 2019-07-20 DIAGNOSIS — D631 Anemia in chronic kidney disease: Secondary | ICD-10-CM | POA: Diagnosis not present

## 2019-07-20 DIAGNOSIS — I4821 Permanent atrial fibrillation: Secondary | ICD-10-CM | POA: Diagnosis not present

## 2019-07-20 DIAGNOSIS — Z9181 History of falling: Secondary | ICD-10-CM | POA: Diagnosis not present

## 2019-07-20 DIAGNOSIS — Z7951 Long term (current) use of inhaled steroids: Secondary | ICD-10-CM | POA: Diagnosis not present

## 2019-07-20 DIAGNOSIS — Z8546 Personal history of malignant neoplasm of prostate: Secondary | ICD-10-CM | POA: Diagnosis not present

## 2019-07-20 DIAGNOSIS — K219 Gastro-esophageal reflux disease without esophagitis: Secondary | ICD-10-CM | POA: Diagnosis not present

## 2019-07-20 DIAGNOSIS — I255 Ischemic cardiomyopathy: Secondary | ICD-10-CM | POA: Diagnosis not present

## 2019-07-20 DIAGNOSIS — Z8673 Personal history of transient ischemic attack (TIA), and cerebral infarction without residual deficits: Secondary | ICD-10-CM | POA: Diagnosis not present

## 2019-07-26 ENCOUNTER — Telehealth: Payer: Self-pay | Admitting: Cardiovascular Disease

## 2019-07-26 DIAGNOSIS — I441 Atrioventricular block, second degree: Secondary | ICD-10-CM | POA: Diagnosis not present

## 2019-07-26 DIAGNOSIS — Z87891 Personal history of nicotine dependence: Secondary | ICD-10-CM | POA: Diagnosis not present

## 2019-07-26 DIAGNOSIS — I110 Hypertensive heart disease with heart failure: Secondary | ICD-10-CM | POA: Diagnosis not present

## 2019-07-26 DIAGNOSIS — H9193 Unspecified hearing loss, bilateral: Secondary | ICD-10-CM | POA: Diagnosis not present

## 2019-07-26 DIAGNOSIS — Z8546 Personal history of malignant neoplasm of prostate: Secondary | ICD-10-CM | POA: Diagnosis not present

## 2019-07-26 DIAGNOSIS — I5042 Chronic combined systolic (congestive) and diastolic (congestive) heart failure: Secondary | ICD-10-CM | POA: Diagnosis not present

## 2019-07-26 DIAGNOSIS — N183 Chronic kidney disease, stage 3 unspecified: Secondary | ICD-10-CM | POA: Diagnosis not present

## 2019-07-26 DIAGNOSIS — D631 Anemia in chronic kidney disease: Secondary | ICD-10-CM | POA: Diagnosis not present

## 2019-07-26 DIAGNOSIS — Z7951 Long term (current) use of inhaled steroids: Secondary | ICD-10-CM | POA: Diagnosis not present

## 2019-07-26 DIAGNOSIS — I890 Lymphedema, not elsewhere classified: Secondary | ICD-10-CM | POA: Diagnosis not present

## 2019-07-26 DIAGNOSIS — Z950 Presence of cardiac pacemaker: Secondary | ICD-10-CM | POA: Diagnosis not present

## 2019-07-26 DIAGNOSIS — E7800 Pure hypercholesterolemia, unspecified: Secondary | ICD-10-CM | POA: Diagnosis not present

## 2019-07-26 DIAGNOSIS — Z95810 Presence of automatic (implantable) cardiac defibrillator: Secondary | ICD-10-CM | POA: Diagnosis not present

## 2019-07-26 DIAGNOSIS — I071 Rheumatic tricuspid insufficiency: Secondary | ICD-10-CM | POA: Diagnosis not present

## 2019-07-26 DIAGNOSIS — I252 Old myocardial infarction: Secondary | ICD-10-CM | POA: Diagnosis not present

## 2019-07-26 DIAGNOSIS — I499 Cardiac arrhythmia, unspecified: Secondary | ICD-10-CM | POA: Diagnosis not present

## 2019-07-26 DIAGNOSIS — I2510 Atherosclerotic heart disease of native coronary artery without angina pectoris: Secondary | ICD-10-CM | POA: Diagnosis not present

## 2019-07-26 DIAGNOSIS — I4821 Permanent atrial fibrillation: Secondary | ICD-10-CM | POA: Diagnosis not present

## 2019-07-26 DIAGNOSIS — I255 Ischemic cardiomyopathy: Secondary | ICD-10-CM | POA: Diagnosis not present

## 2019-07-26 DIAGNOSIS — K219 Gastro-esophageal reflux disease without esophagitis: Secondary | ICD-10-CM | POA: Diagnosis not present

## 2019-07-26 DIAGNOSIS — Z951 Presence of aortocoronary bypass graft: Secondary | ICD-10-CM | POA: Diagnosis not present

## 2019-07-26 DIAGNOSIS — Z9181 History of falling: Secondary | ICD-10-CM | POA: Diagnosis not present

## 2019-07-26 DIAGNOSIS — Z8673 Personal history of transient ischemic attack (TIA), and cerebral infarction without residual deficits: Secondary | ICD-10-CM | POA: Diagnosis not present

## 2019-07-26 NOTE — Telephone Encounter (Signed)
Spoke with Estill Bamberg the occupational therapist from [facility]. She reports patient has gained 3 lbs in a week. She does say his legs are more swollen than the last time she saw him on Friday. No weeping, no measurements to report.  Patient was around 202-204 lbs on 2/5 and today he is 205lbs. No shortness of breath. Patient endurance is less than it was, he could walk 5 minutes on Friday but only 3.5 minutes today.   Advised patient to weigh daily at the same time in the same way. Advised patient to elevate his lower extremities. Discussed compression stockings but wife states his doctor at the [facility] told him not to wear them because they would push the fluid up. OT does report patient is noncompliant with elevating his lower extremities though he states he does it.    Patient to call back if he gains 3lbs in a day or 5lbs in a week.

## 2019-07-26 NOTE — Telephone Encounter (Signed)
Edema worsening and weight increased, but he has urine output all day and lots of accidents. He is taking the torsemide twice daily, morning and night. Please consolidate the torsemide to 80 mg all given once a day in the morning. Have a urinal or bedside commode to reduce the need to run to the bathroom. By the afternoon, the urine output should be less and allow for fewer accidents. Please take metolazone 2.5 mg once tomorrow morning, 30 minutes before the torsemide. Do not take metolazone every day. Need to re-evaluate his sodium dietary intake although wife says "he never salts his food". If his UO is that high, he must be getting a lot of sodium and water in as well. Keep the Monday appointment and check BMET that day, please.

## 2019-07-26 NOTE — Telephone Encounter (Signed)
Pt c/o swelling: STAT is pt has developed SOB within 24 hours  1) How much weight have you gained and in what time span? 3 lbs in a week   2) If swelling, where is the swelling located? Legs and Feet   3) Are you currently taking a fluid pill? Yes   4) Are you currently SOB? No   5) Do you have a log of your daily weights (if so, list)?   07/19/19 202 lbs to 204 lbs  07/26/19 205 lbs  6) Have you gained 3 pounds in a day or 5 pounds in a week? No   7) Have you traveled recently? No   Thomas [name] from [facility] is calling stating Thomas [name] has been having excessive swelling in his legs and feet. He has gained 3 lbs in a week due to it and they feel the fluid pill is not working. Thomas [name] will be leaving [place] within the next 30 minutes so if you are unable to call within that time frame please contact the patient in regards to this.

## 2019-07-27 NOTE — Telephone Encounter (Signed)
The patient and the patient's wife have been made aware via interpretor. They have been advised to keep the appointment on 5/17 and to have labs drawn the same day.

## 2019-07-28 NOTE — Progress Notes (Signed)
Patient ID: Thomas [name], male       DOB: Oct 07, 2034, 85 y.o.      MRN [id number]  Entire visit was done with sign language interpreter present  HPI  Thomas [name] is a 85 y/o male with a history of CAD,HTN, CKD, PAF, peptic ulcer, bilateral deafness, previous tobacco use and chronic heart failure.   TEE on 06/22/19 reviewed and showed an EF of 40-45% without vegetation along with moderate TR and mild AR. Echo report from 05/21/19 reviewed and showed an EF of 35-40% along with moderately elevated PA pressure, mild/moderate Thomas and severe TR. Echo report from 05/02/2018 reviewed and showed an EF of 35-40% along with mild/ moderate Thomas, mild AR and moderate pulmonary HTN.Echo report from 11/25/15 reviewed and showed an EF of 45-50% along with mild AR, moderate Thomas and mild-moderate TR.   Was in the ED 07/14/19 due to peripheral edema. Given metolazone and he was released. Admitted 06/16/19 due to malaise and vomiting. Cardiology, ID and GI consults obtained. Blood cultures positive for group B strep. Antibiotics started and PICC line placed for 4 weeks of IV antibiotics. Initially given IV lasix with transition to oral diuretics. Discharged after 9 days. Admitted 05/19/19 due to acute on chronic HF. Cardiology consult obtained. Initially given IV lasix and then transitioned to oral diuretics. Diuretic was held for a few days due to hypotension. Down ~10 kg. COVID negative. Diagnosed with liver cirrhosis. Ultrasound ruled out DVT. Given 1 unit of blood due to anemia. Elevated troponin thought to be due to demand ischemia. Discharged after 16 days.   He presents today for a follow-up visit with a chief complaint of minimal fatigue upon moderate exertion. He describes this as chronic in nature having been present for several years. He has associated pedal edema, with occasional blisters, easy bruising and difficulty sleeping along with this. He denies any dizziness, abdominal distention, palpitations, chest pain, shortness of  breath, cough or weight gain.   Says that he took one dose of metolazone last week with slight improvement in edema. Doesn't wear compression socks because then he feels tight/bloated in abdomen. Hasn't been weighing himself daily but does have scales. Also has a BP cuff at home but hasn't been checking his BP.   Has been drinking ~ 180 ounces of fluids / day. Says that he's not adding salt to his food but does go out to eat "occasionally". Has has swelling with the use of entresto in the past.   Past Medical History:  Diagnosis Date  . AICD (automatic cardioverter/defibrillator) present   . Biventricular ICD (implantable cardioverter-defibrillator) in place 03/24/2005   Implantation of a [employer]onic Adapta ADDRO1, serial number [id number]  . CHF (congestive heart failure) ([place])   . CKD (chronic kidney disease), stage III   . Coronary artery disease    a. s/p CABG 1986. b. Multiple PCIs/caths. c. 09/2013: s/p PTCA and BMS to SVG-OM.  [name]   . Dysrhythmia   . History of abdominal aortic aneurysm   . History of bleeding peptic ulcer 1980  . History of epididymitis 2013  . HTN (hypertension)   . Hydronephrosis with ureteropelvic junction obstruction   . Hydroureter on left 2009  . Hypertension   . Ischemic cardiomyopathy    a. Prior EF 30-35%, s/p BIV-ICD. b. 09/2013: EF 45-50%.  . Moderate tricuspid regurgitation   . PAF (paroxysmal atrial fibrillation) ([employer])   . Presence of permanent cardiac pacemaker   . Prostate cancer ([place])   .  Status post coronary artery bypass grafting 1986   LIMA to the LAD, SVG to OM, SVG to RCA  . Testicular swelling    Past Surgical History:  Procedure Laterality Date  . 2-D echocardiogram  11/20/2011   Ejection fraction 30-35% moderate concentric left ventricular hypertrophy. Left atrium is moderately dilated. Mild Thomas. Mild or  . BI-VENTRICULAR IMPLANTABLE CARDIOVERTER DEFIBRILLATOR N/A 12/16/2012   Procedure: BI-VENTRICULAR IMPLANTABLE CARDIOVERTER  DEFIBRILLATOR  (CRT-D);  Surgeon: [name], MD;  [facility] C[facility] LAB;  Service: Cardiovascular;  Laterality: N/A;  . CARDIAC CATHETERIZATION  12/10/2011   SVG to OM widely patent.  LIMA to LAD patent  . CATARACT EXTRACTION W/PHACO Right 10/12/2017   Procedure: CATARACT EXTRACTION PHACO AND INTRAOCULAR LENS PLACEMENT (IOC);  Surgeon: Birder Robson, MD;  Location: [facility] ORS;  Service: Ophthalmology;  Laterality: Right;  [place] 00:57 AP% 15.9 CDE 9.07 Fluid pack lot # [id number]  . COLONOSCOPY N/A 07/13/2018   Procedure: COLONOSCOPY;  Surgeon: Toledo, Benay Pike, MD;  Location: [facility] ENDOSCOPY;  Service: Gastroenterology;  Laterality: N/A;  . CORONARY ARTERY BYPASS GRAFT  1986  . ENTEROSCOPY N/A 09/14/2018   Procedure: ENTEROSCOPY;  Surgeon: Toledo, Benay Pike, MD;  Location: [facility] ENDOSCOPY;  Service: Gastroenterology;  Laterality: N/A;  symptomatic anemia, GI blood loss anemia, melena, positive small bowel capsule endoscopy showing source of bleeding   . ESOPHAGOGASTRODUODENOSCOPY N/A 07/13/2018   Procedure: ESOPHAGOGASTRODUODENOSCOPY (EGD);  Surgeon: Toledo, Benay Pike, MD;  Location: [facility] ENDOSCOPY;  Service: Gastroenterology;  Laterality: N/A;  . ESOPHAGOGASTRODUODENOSCOPY N/A 09/14/2018   Procedure: ESOPHAGOGASTRODUODENOSCOPY (EGD);  Surgeon: Toledo, Benay Pike, MD;  Location: [facility] ENDOSCOPY;  Service: Gastroenterology;  Laterality: N/A;  SIGN LANAGUAGE INTERPRETER  . ESOPHAGOGASTRODUODENOSCOPY N/A 06/21/2019   Procedure: ESOPHAGOGASTRODUODENOSCOPY (EGD);  Surgeon: Toledo, Benay Pike, MD;  Location: [facility] ENDOSCOPY;  Service: Gastroenterology;  Laterality: N/A;  . ESOPHAGOGASTRODUODENOSCOPY (EGD) WITH PROPOFOL N/A 05/27/2018   Procedure: ESOPHAGOGASTRODUODENOSCOPY (EGD) WITH PROPOFOL;  Surgeon: Clarene Essex, MD;  Location: [place];  Service: Endoscopy;  Laterality: N/A;  . INSERT / REPLACE / REMOVE PACEMAKER    . LEFT HEART CATHETERIZATION WITH CORONARY/GRAFT ANGIOGRAM N/A 12/10/2011   Procedure:  LEFT HEART CATHETERIZATION WITH Beatrix Fetters;  Surgeon: Sanda Klein, MD;  Location: [facility] CATH LAB;  Service: Cardiovascular;  Laterality: N/A;  . LEFT HEART CATHETERIZATION WITH CORONARY/GRAFT ANGIOGRAM N/A 09/25/2013   Procedure: LEFT HEART CATHETERIZATION WITH Beatrix Fetters;  Surgeon: Blane Ohara, MD;  Location: [facility] CATH LAB;  Service: Cardiovascular;  Laterality: N/A;  . Persantine Myoview  05/06/2010   Post-rest ejection fraction 30%. No significant ischemia demonstrated. Compared to previous study there is no significant change.  . TEE WITHOUT CARDIOVERSION N/A 06/22/2019   Procedure: TRANSESOPHAGEAL ECHOCARDIOGRAM (TEE);  Surgeon: Minna Merritts, MD;  Location: [facility] ORS;  Service: Cardiovascular;  Laterality: N/A;  . TRANSURETHRAL RESECTION OF PROSTATE     s/p   Family History  Problem Relation Age of Onset  . Hypertension Father    Social History   Tobacco Use  . Smoking status: Former Smoker    Quit date: 03/15/1985    Years since quitting: 34.3  . Smokeless tobacco: Never Used  Substance Use Topics  . Alcohol use: No    Comment: occas.   Allergies  Allergen Reactions  . Entresto [Sacubitril-Valsartan] Swelling    And bruising of arm  . Phenazopyridine Nausea Only and Other (See Comments)    GI UPSET  . Ramipril Other (See Comments)    unk Other reaction(s): Other (See Comments),  Unknown unk    Prior to Admission medications   Medication Sig Start Date End Date Taking? Authorizing Provider  albuterol (VENTOLIN HFA) 108 (90 Base) MCG/ACT inhaler Inhale 2 puffs into the lungs 4 (four) times daily as needed. Patient taking differently: Inhale 2 puffs into the lungs 4 (four) times daily as needed for wheezing or shortness of breath.  08/02/18  Yes Croitoru, Mihai, MD  [employer] ACETAMINOPHEN EX ST 500 MG tablet Take 500-1,000 mg by mouth every 6 (six) hours as needed for pain or fever. 04/26/19  Yes [provider]  ferrous sulfate 325 (65  FE) MG tablet Take 1 tablet (325 mg total) by mouth daily for 30 days. 05/28/18  Yes Elodia Florence., MD  losartan (COZAAR) 25 MG tablet Take 0.5 tablets (12.5 mg total) by mouth daily. 06/26/19  Yes Danford, Suann Larry, MD  Multiple Vitamin (MULTIVITAMIN WITH MINERALS) TABS tablet Take 1 tablet by mouth daily. 05/29/19  Yes Nicole Kindred A, DO  pantoprazole (PROTONIX) 40 MG tablet Take 1 tablet (40 mg total) by mouth 2 (two) times daily before a meal. 06/25/19  Yes Danford, Suann Larry, MD  potassium chloride SA (KLOR-CON) 20 MEQ tablet Take 1 tablet (20 mEq total) by mouth 2 (two) times daily. 06/25/19  Yes Danford, Suann Larry, MD  simvastatin (ZOCOR) 20 MG tablet TAKE 1 TABLET BY MOUTH EVERY DAY Patient taking differently: Take 20 mg by mouth daily.  04/07/19  Yes Kilroy, Luke K, PA-C  torsemide (DEMADEX) 20 MG tablet Take 2 tablets (40 mg total) by mouth 2 (two) times daily. Patient taking differently: Take 80 mg by mouth daily.  06/25/19  Yes Danford, Suann Larry, MD  triamcinolone cream (KENALOG) 0.1 % Apply 1 application topically 2 (two) times daily as needed for itching. (avoid face, groin and axilla) 04/17/19  Yes [provider]    Review of Systems  Constitutional: Positive for fatigue. Negative for appetite change.  HENT: Positive for hearing loss. Negative for congestion, rhinorrhea and sore throat.   Eyes: Negative.   Respiratory: Negative for cough, chest tightness and shortness of breath.   Cardiovascular: Positive for leg swelling. Negative for chest pain and palpitations.  Gastrointestinal: Negative for abdominal distention and abdominal pain.  Endocrine: Negative.   Genitourinary: Negative.   Musculoskeletal: Negative for arthralgias and back pain.  Skin: Negative.   Allergic/Immunologic: Negative.   Neurological: Negative for dizziness and light-headedness.  Hematological: Negative for adenopathy. Bruises/bleeds easily.  Psychiatric/Behavioral:  Positive for sleep disturbance (sleeping in recliner). Negative for dysphoric mood. The patient is not nervous/anxious.    Vitals:   07/31/19 1156  BP: (!) 97/54  Pulse: 69  Resp: 16  SpO2: 97%  Weight: 204 lb (92.5 kg)  Height: 6\' 2"  (1.88 m)   Wt Readings from Last 3 Encounters:  07/31/19 204 lb (92.5 kg)  07/14/19 204 lb (92.5 kg)  07/04/19 207 lb 6.4 oz (94.1 kg)   Lab Results  Component Value Date   CREATININE 1.24 07/14/2019   CREATININE 1.27 (H) 06/25/2019   CREATININE 1.34 (H) 06/24/2019    Physical Exam Vitals and nursing note reviewed.  Constitutional:      Appearance: He is well-developed.  HENT:     Head: Normocephalic and atraumatic.     Ears:     Comments: Completely deaf in both ears Cardiovascular:     Rate and Rhythm: Normal rate and regular rhythm.  Pulmonary:     Effort: Pulmonary effort is normal.  Breath sounds: Normal breath sounds. No rhonchi or rales.  Abdominal:     Palpations: Abdomen is soft.     Tenderness: There is no abdominal tenderness.  Musculoskeletal:     Cervical back: Normal range of motion and neck supple.     Right lower leg: No tenderness. Edema (2+ pitting) present.     Left lower leg: No tenderness. Edema (2+ pitting) present.  Skin:    General: Skin is warm and dry.  Neurological:     Mental Status: He is alert and oriented to person, place, and time.     Motor: No weakness.  Psychiatric:        Mood and Affect: Mood normal.        Behavior: Behavior normal.    Assessment & Plan:  1: Chronic heart failure with reduced ejection fraction- - NYHA class II - euvolemic today - not weighing daily and he was instructed to resume weighing daily and write the weight down so that he can call for an overnight weight gain of >2 pounds or a weekly weight gain of >5 pounds - weight up 3 pounds from last visit here 2 months ago but down a couple of pounds since using one dose of metolazone - not adding salt and has been  reading food labels. Reviewed the importance of closely following a 2000mg  sodium diet; does occasionally go out to eat and is aware that eating out provides more sodium then eating at home - drinks 1 cup of coffee and ~ 180 ounces of fluid daily; advised that he should be drinking much closer to 60 ounces of fluid daily - was called in a dose of metolazone on 07/26/19; BP limits titrating any medications - check BMP & Mg today - saw cardiology Purcell Nails) 07/04/19 - has had side effect from both entresto and ramipril in the past - discussed referral to the [facility] in [place] for assistance in managing this patient. Both patient and his wife are agreeable to going; secure chat message sent to provider and his nurse - has ICD and saw EP [name]) 04/10/19 - BNP 07/14/19 was 298.0  2: HTN- - BP on the low side today but he is without dizziness; wife says that they have a BP cuff at home and will start checking it daily - saw PCP Kary Kos) 06/27/19 - BMP from 07/14/19 reviewed and showed sodium 142, potassium 3.9, creatinine 1.24 and GFR 54  3: Lymphedema- - stage 2 - has been elevating his legs but edema continues; no weeping today but has had blisters on legs previously - has worn compression socks in the past but feels like he gets bloated/ tight in his abdomen when he wears them - took a dose of metolazone last week with slight improvement - discussed lymphedema clinic referral and they are agreeable to this as well   Patient did not bring his medications nor a list. Each medication was verbally reviewed with the patient and he was encouraged to bring the bottles to every visit to confirm accuracy of list.  Will not make a return appointment here at this time, pending evaluation at the [facility].

## 2019-07-31 ENCOUNTER — Ambulatory Visit: Payer: PPO | Attending: Family | Admitting: Family

## 2019-07-31 ENCOUNTER — Encounter: Payer: Self-pay | Admitting: Family

## 2019-07-31 ENCOUNTER — Telehealth: Payer: Self-pay | Admitting: Family

## 2019-07-31 ENCOUNTER — Other Ambulatory Visit: Payer: Self-pay

## 2019-07-31 VITALS — BP 97/54 | HR 69 | Resp 16 | Ht 74.0 in | Wt 204.0 lb

## 2019-07-31 DIAGNOSIS — Z955 Presence of coronary angioplasty implant and graft: Secondary | ICD-10-CM | POA: Diagnosis not present

## 2019-07-31 DIAGNOSIS — N183 Chronic kidney disease, stage 3 unspecified: Secondary | ICD-10-CM | POA: Insufficient documentation

## 2019-07-31 DIAGNOSIS — Z951 Presence of aortocoronary bypass graft: Secondary | ICD-10-CM | POA: Diagnosis not present

## 2019-07-31 DIAGNOSIS — K7460 Unspecified cirrhosis of liver: Secondary | ICD-10-CM | POA: Diagnosis not present

## 2019-07-31 DIAGNOSIS — Z95810 Presence of automatic (implantable) cardiac defibrillator: Secondary | ICD-10-CM | POA: Insufficient documentation

## 2019-07-31 DIAGNOSIS — Z9889 Other specified postprocedural states: Secondary | ICD-10-CM | POA: Diagnosis not present

## 2019-07-31 DIAGNOSIS — Z8546 Personal history of malignant neoplasm of prostate: Secondary | ICD-10-CM | POA: Diagnosis not present

## 2019-07-31 DIAGNOSIS — Z8711 Personal history of peptic ulcer disease: Secondary | ICD-10-CM | POA: Diagnosis not present

## 2019-07-31 DIAGNOSIS — I2510 Atherosclerotic heart disease of native coronary artery without angina pectoris: Secondary | ICD-10-CM | POA: Diagnosis not present

## 2019-07-31 DIAGNOSIS — Z8679 Personal history of other diseases of the circulatory system: Secondary | ICD-10-CM | POA: Insufficient documentation

## 2019-07-31 DIAGNOSIS — H9193 Unspecified hearing loss, bilateral: Secondary | ICD-10-CM | POA: Diagnosis not present

## 2019-07-31 DIAGNOSIS — I480 Paroxysmal atrial fibrillation: Secondary | ICD-10-CM | POA: Insufficient documentation

## 2019-07-31 DIAGNOSIS — I130 Hypertensive heart and chronic kidney disease with heart failure and stage 1 through stage 4 chronic kidney disease, or unspecified chronic kidney disease: Secondary | ICD-10-CM | POA: Insufficient documentation

## 2019-07-31 DIAGNOSIS — I5022 Chronic systolic (congestive) heart failure: Secondary | ICD-10-CM | POA: Diagnosis not present

## 2019-07-31 DIAGNOSIS — I255 Ischemic cardiomyopathy: Secondary | ICD-10-CM | POA: Diagnosis not present

## 2019-07-31 DIAGNOSIS — I10 Essential (primary) hypertension: Secondary | ICD-10-CM

## 2019-07-31 DIAGNOSIS — Z79899 Other long term (current) drug therapy: Secondary | ICD-10-CM | POA: Insufficient documentation

## 2019-07-31 DIAGNOSIS — Z87891 Personal history of nicotine dependence: Secondary | ICD-10-CM | POA: Insufficient documentation

## 2019-07-31 DIAGNOSIS — Z8619 Personal history of other infectious and parasitic diseases: Secondary | ICD-10-CM | POA: Insufficient documentation

## 2019-07-31 DIAGNOSIS — I890 Lymphedema, not elsewhere classified: Secondary | ICD-10-CM

## 2019-07-31 DIAGNOSIS — Z8249 Family history of ischemic heart disease and other diseases of the circulatory system: Secondary | ICD-10-CM | POA: Diagnosis not present

## 2019-07-31 DIAGNOSIS — D649 Anemia, unspecified: Secondary | ICD-10-CM | POA: Diagnosis not present

## 2019-07-31 LAB — MAGNESIUM: Magnesium: 2.2 mg/dL (ref 1.7–2.4)

## 2019-07-31 LAB — BASIC METABOLIC PANEL
Anion gap: 9 (ref 5–15)
BUN: 28 mg/dL — ABNORMAL HIGH (ref 8–23)
CO2: 31 mmol/L (ref 22–32)
Calcium: 8 mg/dL — ABNORMAL LOW (ref 8.9–10.3)
Chloride: 102 mmol/L (ref 98–111)
Creatinine, Ser: 1.18 mg/dL (ref 0.61–1.24)
GFR calc Af Amer: >60 mL/min (ref >60)
GFR calc non Af Amer: 57 mL/min — ABNORMAL LOW (ref >60)
Glucose, Bld: 104 mg/dL — ABNORMAL HIGH (ref 70–99)
Potassium: 3.1 mmol/L — ABNORMAL LOW (ref 3.5–5.1)
Sodium: 142 mmol/L (ref 135–145)

## 2019-07-31 NOTE — Patient Instructions (Addendum)
Resume weighing daily and call for an overnight weight gain of > 2 pounds or a weekly weight gain of >5 pounds.   Decrease fluid intake to closer to 60 ounces daily.    We will make referrals to the [facility] in [place] as well as to the Lymphedema clinic

## 2019-07-31 NOTE — Telephone Encounter (Signed)
LM on home phone via phone language services. Potassium is a little low at 3.1, most likely from metolazone use last week. Advised to double his potassium to 2 tablets BID for the next 3 days. Renal function and magnesium look good.

## 2019-08-01 ENCOUNTER — Telehealth: Payer: Self-pay | Admitting: Family

## 2019-08-01 MED ORDER — SPIRONOLACTONE 25 MG PO TABS: 25.0000 mg | ORAL_TABLET | Freq: Every day | ORAL | 3 refills | Status: DC

## 2019-08-01 NOTE — Telephone Encounter (Signed)
Called patient and spoke to wife via sign language phone interpreter services. Advised that I would like to start spironolactone 25mg  every morning to help with edema and potassium retention.   Patient's wife was asking what time of day patient should take his BID torsemide. She says that he usually takes his morning dose around 7am so advised her that he could take his second dose around 1pm so that hopefully it won't interfere with him having to get up at night to urinate.   Referrals have been made to the lymphedema clinic and the [facility]. Until he gets established with them, will continue seeing patient. Appointment was made for 08/22/19 so that lab work can be checked at that time.   Patient's wife verbalized understanding of instructions.

## 2019-08-02 ENCOUNTER — Encounter: Payer: Self-pay | Admitting: Occupational Therapy

## 2019-08-02 NOTE — Patient Instructions (Signed)

## 2019-08-04 DIAGNOSIS — H26491 Other secondary cataract, right eye: Secondary | ICD-10-CM | POA: Diagnosis not present

## 2019-08-11 ENCOUNTER — Telehealth: Payer: Self-pay | Admitting: Internal Medicine

## 2019-08-11 NOTE — Telephone Encounter (Signed)
Returned call to patient no answer.LMTC. 

## 2019-08-11 NOTE — Telephone Encounter (Signed)
Spoke to patient's wife through interpreter she stated husband continues to have swelling in both legs.He took a metolazone 2 days ago.She will give him another metolazone 2.5 mg tomorrow 30 mins before am dose of torsemide.Patient is not sob.Weight stable.Follow up appointment moved up to 6/2 at 2:15 pm with Coletta Memos NP.

## 2019-08-11 NOTE — Telephone Encounter (Signed)
Pt c/o swelling: STAT is pt has developed SOB within 24 hours  1) How much weight have you gained and in what time span?  No weight gain  2) If swelling, where is the swelling located? Legs, and there are blisters on legs  3) Are you currently taking a fluid pill? yes  4) Are you currently SOB?   5) Do you have a log of your daily weights (if so, list)?   6) Have you gained 3 pounds in a day or 5 pounds in a week?no  7) Have you traveled recently? No- he wants to know if he can take 1 tablet of his Metoprolol?

## 2019-08-11 NOTE — Telephone Encounter (Signed)
Patient is returning Cheryl's call. When a call is returned to the patient please wait for interpreter to get on the line prior to hanging up.

## 2019-08-15 ENCOUNTER — Ambulatory Visit (INDEPENDENT_AMBULATORY_CARE_PROVIDER_SITE_OTHER): Payer: PPO | Admitting: *Deleted

## 2019-08-15 ENCOUNTER — Ambulatory Visit: Payer: PPO | Admitting: Occupational Therapy

## 2019-08-15 DIAGNOSIS — I4821 Permanent atrial fibrillation: Secondary | ICD-10-CM | POA: Diagnosis not present

## 2019-08-15 DIAGNOSIS — I890 Lymphedema, not elsewhere classified: Secondary | ICD-10-CM | POA: Insufficient documentation

## 2019-08-15 NOTE — Progress Notes (Signed)
Remote ICD transmission.   

## 2019-08-16 ENCOUNTER — Encounter: Payer: Self-pay | Admitting: General Practice

## 2019-08-16 ENCOUNTER — Other Ambulatory Visit: Payer: Self-pay

## 2019-08-16 ENCOUNTER — Ambulatory Visit (INDEPENDENT_AMBULATORY_CARE_PROVIDER_SITE_OTHER): Payer: PPO | Admitting: General Practice

## 2019-08-16 VITALS — BP 116/54 | HR 74 | Temp 98.1°F | Ht 74.0 in | Wt 205.0 lb

## 2019-08-16 DIAGNOSIS — Z95810 Presence of automatic (implantable) cardiac defibrillator: Secondary | ICD-10-CM

## 2019-08-16 DIAGNOSIS — I10 Essential (primary) hypertension: Secondary | ICD-10-CM

## 2019-08-16 DIAGNOSIS — I255 Ischemic cardiomyopathy: Secondary | ICD-10-CM | POA: Diagnosis not present

## 2019-08-16 LAB — CUP PACEART REMOTE DEVICE CHECK
Battery Remaining Longevity: 2 mo
Battery Voltage: 2.72 V
Brady Statistic AP VP Percent: 0 %
Brady Statistic AP VS Percent: 0 %
Brady Statistic AS VP Percent: 85.38 %
Brady Statistic AS VS Percent: 14.62 %
Brady Statistic RA Percent Paced: 0 %
Brady Statistic RV Percent Paced: 89.84 %
Date Time Interrogation Session: 20210601002205
HighPow Impedance: 67 Ohm
Implantable Lead Implant Date: 20000210
Implantable Lead Implant Date: 20000210
Implantable Lead Implant Date: 20141003
Implantable Lead Location: 753858
Implantable Lead Location: 753859
Implantable Lead Location: 753860
Implantable Lead Model: 4244
Implantable Lead Model: 4285
Implantable Lead Model: 4298
Implantable Lead Serial Number: 272469
Implantable Lead Serial Number: 413633
Implantable Pulse Generator Implant Date: 20141003
Lead Channel Impedance Value: 266 Ohm
Lead Channel Impedance Value: 285 Ohm
Lead Channel Impedance Value: 285 Ohm
Lead Channel Impedance Value: 323 Ohm
Lead Channel Impedance Value: 323 Ohm
Lead Channel Impedance Value: 323 Ohm
Lead Channel Impedance Value: 399 Ohm
Lead Channel Impedance Value: 437 Ohm
Lead Channel Impedance Value: 494 Ohm
Lead Channel Impedance Value: 513 Ohm
Lead Channel Impedance Value: 513 Ohm
Lead Channel Impedance Value: 551 Ohm
Lead Channel Impedance Value: 551 Ohm
Lead Channel Pacing Threshold Amplitude: 0.75 V
Lead Channel Pacing Threshold Amplitude: 2 V
Lead Channel Pacing Threshold Pulse Width: 0.4 ms
Lead Channel Pacing Threshold Pulse Width: 0.6 ms
Lead Channel Sensing Intrinsic Amplitude: 0.875 mV
Lead Channel Sensing Intrinsic Amplitude: 0.875 mV
Lead Channel Sensing Intrinsic Amplitude: 4.375 mV
Lead Channel Sensing Intrinsic Amplitude: 4.375 mV
Lead Channel Setting Pacing Amplitude: 2 V
Lead Channel Setting Pacing Amplitude: 3 V
Lead Channel Setting Pacing Pulse Width: 0.4 ms
Lead Channel Setting Pacing Pulse Width: 0.6 ms
Lead Channel Setting Sensing Sensitivity: 0.3 mV

## 2019-08-16 NOTE — Patient Instructions (Addendum)
Medication Instructions:  The current medical regimen is effective;  continue present plan and medications as directed. Please refer to the Current Medication list given to you today. *If you need a refill on your cardiac medications before your next appointment, please call your pharmacy*  Special Instructions TAKE AND LOG YOUR WEIGHT DAILY  ELEVATE YOUR LEGS WHEN SITTING  PLEASE READ AND FOLLOW SALTY 6-ATTACHED  Follow-Up: Your next appointment:  3 month(s) W/PACER CHECK In Person with Sanda Klein, MD  At [facility], you and your health needs are our priority.  As part of our continuing mission to provide you with exceptional heart care, we have created designated Provider Care Teams.  These Care Teams include your primary Cardiologist (physician) and Advanced Practice Providers (APPs -  Physician Assistants and Nurse Practitioners) who all work together to provide you with the care you need, when you need it.  We recommend signing up for the patient portal called "MyChart".  Sign up information is provided on this After Visit Summary.  MyChart is used to connect with patients for Virtual Visits (Telemedicine).  Patients are able to view lab/test results, encounter notes, upcoming appointments, etc.  Non-urgent messages can be sent to your provider as well.   To learn more about what you can do with MyChart, go to [email or web address].

## 2019-08-16 NOTE — Progress Notes (Signed)
Cardiology Clinic Note   Patient Name: Thomas [name] Date of Encounter: 08/16/2019  Primary Care Provider:  [name], MD Primary Cardiologist:  Sanda Klein, MD  Patient Profile    Thomas [name]. Por 85 year old male presents to the clinic today for an evaluation of his lower extremity edema.  Past Medical History    Past Medical History:  Diagnosis Date  . AICD (automatic cardioverter/defibrillator) present   . Biventricular ICD (implantable cardioverter-defibrillator) in place 03/24/2005   Implantation of a [employer]onic Adapta ADDRO1, serial number [id number]  . CHF (congestive heart failure) ([place])   . CKD (chronic kidney disease), stage III   . Coronary artery disease    a. s/p CABG 1986. b. Multiple PCIs/caths. c. 09/2013: s/p PTCA and BMS to SVG-OM.  [name]   . Dysrhythmia   . History of abdominal aortic aneurysm   . History of bleeding peptic ulcer 1980  . History of epididymitis 2013  . HTN (hypertension)   . Hydronephrosis with ureteropelvic junction obstruction   . Hydroureter on left 2009  . Hypertension   . Ischemic cardiomyopathy    a. Prior EF 30-35%, s/p BIV-ICD. b. 09/2013: EF 45-50%.  . Moderate tricuspid regurgitation   . PAF (paroxysmal atrial fibrillation) ([place])   . Presence of permanent cardiac pacemaker   . Prostate cancer ([place])   . Status post coronary artery bypass grafting 1986   LIMA to the LAD, SVG to OM, SVG to RCA  . Testicular swelling    Past Surgical History:  Procedure Laterality Date  . 2-D echocardiogram  11/20/2011   Ejection fraction 30-35% moderate concentric left ventricular hypertrophy. Left atrium is moderately dilated. Mild MR. Mild or  . BI-VENTRICULAR IMPLANTABLE CARDIOVERTER DEFIBRILLATOR N/A 12/16/2012   Procedure: BI-VENTRICULAR IMPLANTABLE CARDIOVERTER DEFIBRILLATOR  (CRT-D);  Surgeon: [name], MD;  Location: [facility] CATH LAB;  Service: Cardiovascular;  Laterality: N/A;  . CARDIAC CATHETERIZATION  12/10/2011   SVG to  OM widely patent.  LIMA to LAD patent  . CATARACT EXTRACTION W/PHACO Right 10/12/2017   Procedure: CATARACT EXTRACTION PHACO AND INTRAOCULAR LENS PLACEMENT (IOC);  Surgeon: Birder Robson, MD;  Location: [facility] ORS;  Service: Ophthalmology;  Laterality: Right;  [place] 00:57 AP% 15.9 CDE 9.07 Fluid pack lot # [id number]  . COLONOSCOPY N/A 07/13/2018   Procedure: COLONOSCOPY;  Surgeon: Toledo, Benay Pike, MD;  Location: [facility] ENDOSCOPY;  Service: Gastroenterology;  Laterality: N/A;  . CORONARY ARTERY BYPASS GRAFT  1986  . ENTEROSCOPY N/A 09/14/2018   Procedure: ENTEROSCOPY;  Surgeon: Toledo, Benay Pike, MD;  Location: [facility] ENDOSCOPY;  Service: Gastroenterology;  Laterality: N/A;  symptomatic anemia, GI blood loss anemia, melena, positive small bowel capsule endoscopy showing source of bleeding   . ESOPHAGOGASTRODUODENOSCOPY N/A 07/13/2018   Procedure: ESOPHAGOGASTRODUODENOSCOPY (EGD);  Surgeon: Toledo, Benay Pike, MD;  Location: [facility] ENDOSCOPY;  Service: Gastroenterology;  Laterality: N/A;  . ESOPHAGOGASTRODUODENOSCOPY N/A 09/14/2018   Procedure: ESOPHAGOGASTRODUODENOSCOPY (EGD);  Surgeon: Toledo, Benay Pike, MD;  Location: [facility] ENDOSCOPY;  Service: Gastroenterology;  Laterality: N/A;  SIGN LANAGUAGE INTERPRETER  . ESOPHAGOGASTRODUODENOSCOPY N/A 06/21/2019   Procedure: ESOPHAGOGASTRODUODENOSCOPY (EGD);  Surgeon: Toledo, Benay Pike, MD;  Location: [facility] ENDOSCOPY;  Service: Gastroenterology;  Laterality: N/A;  . ESOPHAGOGASTRODUODENOSCOPY (EGD) WITH PROPOFOL N/A 05/27/2018   Procedure: ESOPHAGOGASTRODUODENOSCOPY (EGD) WITH PROPOFOL;  Surgeon: Clarene Essex, MD;  Location: [place];  Service: Endoscopy;  Laterality: N/A;  . INSERT / REPLACE / REMOVE PACEMAKER    . LEFT HEART CATHETERIZATION WITH CORONARY/GRAFT ANGIOGRAM N/A 12/10/2011   Procedure:  LEFT HEART CATHETERIZATION WITH Beatrix Fetters;  Surgeon: Sanda Klein, MD;  Location: [facility] CATH LAB;  Service: Cardiovascular;  Laterality: N/A;  . LEFT HEART  CATHETERIZATION WITH CORONARY/GRAFT ANGIOGRAM N/A 09/25/2013   Procedure: LEFT HEART CATHETERIZATION WITH Beatrix Fetters;  Surgeon: Blane Ohara, MD;  Location: [facility] CATH LAB;  Service: Cardiovascular;  Laterality: N/A;  . Persantine Myoview  05/06/2010   Post-rest ejection fraction 30%. No significant ischemia demonstrated. Compared to previous study there is no significant change.  . TEE WITHOUT CARDIOVERSION N/A 06/22/2019   Procedure: TRANSESOPHAGEAL ECHOCARDIOGRAM (TEE);  Surgeon: Minna Merritts, MD;  Location: [facility] ORS;  Service: Cardiovascular;  Laterality: N/A;  . TRANSURETHRAL RESECTION OF PROSTATE     s/p    Allergies  Allergies  Allergen Reactions  . Entresto [Sacubitril-Valsartan] Swelling    And bruising of arm  . Phenazopyridine Nausea Only and Other (See Comments)    GI UPSET  . Ramipril Other (See Comments)    unk Other reaction(s): Other (See Comments), Unknown unk    History of Present Illness    [name] has a PMH of permanent atrial fibrillation, HTN, ischemic cardiomyopathy, coronary artery disease status post CABG [id number], combined systolic and diastolic heart failure, Mobitz type II, tricuspid regurgitation, CVA, AKI, prostate cancer, biventricular ICD, and fatigue.  He was seen on 05/19/2019 due to worsening edema and dyspnea x2 weeks.  He was hypoxic and hypotensive at that time.  His chest x-ray demonstrated pulmonary vascular congestion with small bilateral pleural effusion and possible infiltrates at the left lung base.  His echocardiogram showed an EF of 35-40%.  He was diagnosed with sepsis due to group B bacterium.  Infectious disease was consulted and he was placed on IV antibiotics.  A PICC line was placed at that time.  He was prescribed 4 weeks of IV antibiotics post discharge.  He is also prescribed p.o. diuretics and Unna boots.  He was seen by Jory Sims, DNP on 07/04/2019.  During that time he was frail and has lost a significant  amount of weight.  He continued on IV antibiotics.  He was receiving care from home health nursing.  He was receiving infusion care from home nursing as well.  He denied chest pain, dyspnea, dizziness, and palpitations.  Has been seen and evaluated by outpatient rehab for his lower extremity lymphedema.  Received instructions on exercises, manual lymphedema drainage, wraps, foot elevation, monitoring skin, and compression stockings.  08/15/2019  He presents the clinic today for follow-up evaluation and states he feels well.  He has been eating a low-sodium diet and not having sodium to his food.  He uses a cane with ambulation.  He states he was told that wearing lower extremity support stockings will increase the fluid around his lungs.  He was educated about the importance of elevating his lower extremities and not compression for his lower leg would be beneficial.  His weight has been stable.  He states that in the morning his swelling has substantially decreased.  He also states he likes to watch sports and elevates his legs while he is watching.  I will give them information on lower extremity support hose, low-salt diet, elevate lower extremities when not active, and monitoring lower extremity skin.  We will have him follow-up with Dr. Loletha Grayer in 3 months.  Today he denies chest pain, shortness of breath, lower extremity edema, fatigue, palpitations, melena, hematuria, hemoptysis, diaphoresis, weakness, presyncope, syncope, orthopnea, and PND.   Home Medications  Prior to Admission medications   Medication Sig Start Date End Date Taking? Authorizing Provider  albuterol (VENTOLIN HFA) 108 (90 Base) MCG/ACT inhaler Inhale 2 puffs into the lungs 4 (four) times daily as needed. Patient taking differently: Inhale 2 puffs into the lungs 4 (four) times daily as needed for wheezing or shortness of breath.  08/02/18   Croitoru, Mihai, MD  [employer] ACETAMINOPHEN EX ST 500 MG tablet Take 500-1,000 mg by mouth every 6  (six) hours as needed for pain or fever. 04/26/19   [provider]  ferrous sulfate 325 (65 FE) MG tablet Take 1 tablet (325 mg total) by mouth daily for 30 days. 05/28/18   Elodia Florence., MD  losartan (COZAAR) 25 MG tablet Take 0.5 tablets (12.5 mg total) by mouth daily. 06/26/19   Danford, Suann Larry, MD  metolazone (ZAROXOLYN) 2.5 MG tablet Take 1 tablet (2.5 mg total) by mouth once for 1 dose. 30 mins prior to taking torsemide Patient not taking: Reported on 07/31/2019 07/14/19 07/14/19  Lavonia Drafts, MD  Multiple Vitamin (MULTIVITAMIN WITH MINERALS) TABS tablet Take 1 tablet by mouth daily. 05/29/19   Ezekiel Slocumb, DO  pantoprazole (PROTONIX) 40 MG tablet Take 1 tablet (40 mg total) by mouth 2 (two) times daily before a meal. 06/25/19   Danford, Suann Larry, MD  potassium chloride SA (KLOR-CON) 20 MEQ tablet Take 1 tablet (20 mEq total) by mouth 2 (two) times daily. 06/25/19   Danford, Suann Larry, MD  simvastatin (ZOCOR) 20 MG tablet TAKE 1 TABLET BY MOUTH EVERY DAY Patient taking differently: Take 20 mg by mouth daily.  04/07/19   Erlene Quan, PA-C  spironolactone (ALDACTONE) 25 MG tablet Take 1 tablet (25 mg total) by mouth daily. 08/01/19 10/30/19  Alisa Graff, FNP  torsemide (DEMADEX) 20 MG tablet Take 2 tablets (40 mg total) by mouth 2 (two) times daily. Patient taking differently: Take 80 mg by mouth daily.  06/25/19   Danford, Suann Larry, MD  triamcinolone cream (KENALOG) 0.1 % Apply 1 application topically 2 (two) times daily as needed for itching. (avoid face, groin and axilla) 04/17/19   [provider]    Family History    Family History  Problem Relation Age of Onset  . Hypertension Father    He indicated that his mother is deceased. He indicated that his father is deceased.  Social History    Social History   Socioeconomic History  . Marital status: Married    Spouse name: Not on file  . Number of children: Not on file  . Years  of education: Not on file  . Highest education level: Not on file  Occupational History  . Not on file  Tobacco Use  . Smoking status: Former Smoker    Quit date: 03/15/1985    Years since quitting: 34.4  . Smokeless tobacco: Never Used  Substance and Sexual Activity  . Alcohol use: No    Comment: occas.  . Drug use: No  . Sexual activity: Yes  Other Topics Concern  . Not on file  Social History Narrative  . Not on file   Social Determinants of Health   Financial Resource Strain: Low Risk   . Difficulty of Paying Living Expenses: Not hard at all  Food Insecurity: No Food Insecurity  . Worried About Charity fundraiser in the Last Year: Never true  . Ran Out of Food in the Last Year: Never true  Transportation Needs: No Transportation  Needs  . Lack of Transportation (Medical): No  . Lack of Transportation (Non-Medical): No  Physical Activity: Inactive  . Days of Exercise per Week: 0 days  . Minutes of Exercise per Session: 0 min  Stress: No Stress Concern Present  . Feeling of Stress : Not at all  Social Connections: Slightly Isolated  . Frequency of Communication with Friends and Family: Never  . Frequency of Social Gatherings with Friends and Family: Never  . Attends Religious Services: 1 to 4 times per year  . Active Member of Clubs or Organizations: Yes  . Attends Archivist Meetings: 1 to 4 times per year  . Marital Status: Married  Human resources officer Violence: Not At Risk  . Fear of Current or Ex-Partner: No  . Emotionally Abused: No  . Physically Abused: No  . Sexually Abused: No     Review of Systems    General:  No chills, fever, night sweats or weight changes.  Cardiovascular:  No chest pain, dyspnea on exertion, edema, orthopnea, palpitations, paroxysmal nocturnal dyspnea. Dermatological: No rash, lesions/masses Respiratory: No cough, dyspnea Urologic: No hematuria, dysuria Abdominal:   No nausea, vomiting, diarrhea, bright red blood per  rectum, melena, or hematemesis Neurologic:  No visual changes, wkns, changes in mental status. All other systems reviewed and are otherwise negative except as noted above.  Physical Exam    VS:  BP (!) 116/54 (BP Location: Left Arm, Patient Position: Sitting, Cuff Size: Normal)   Pulse 74   Temp 98.1 F (36.7 C)   Ht 6\' 2"  (1.88 m)   Wt 205 lb (93 kg)   BMI 26.32 kg/m  , BMI Body mass index is 26.32 kg/m. GEN: Well nourished, well developed, in no acute distress. HEENT: normal. Neck: Supple, no JVD, carotid bruits, or masses. Cardiac: RRR, no murmurs, rubs, or gallops. No clubbing, cyanosis, bilateral lower extremity +2 pitting edema.  Radials/DP/PT 2+ and equal bilaterally.  Respiratory:  Respirations regular and unlabored, clear to auscultation bilaterally. GI: Soft, nontender, nondistended, BS + x 4. MS: no deformity or atrophy. Skin: warm and dry, no rash. Neuro:  Strength and sensation are intact. Psych: Normal affect.  Accessory Clinical Findings    ECG personally reviewed by me today-none today.  EKG 06/16/2019 Accelerated junctional rhythm 71 bpm  Echocardiogram 06/19/2019 1. Left ventricular ejection fraction, by estimation, is 35 to 40%. The  left ventricle has moderately decreased function. The left ventricle has  no regional wall motion abnormalities. The left ventricular internal  cavity size was mildly to moderately  dilated. Left ventricular diastolic parameters are indeterminate.  2. Right ventricular systolic function is mildly reduced. The right  ventricular size is normal. There is moderately elevated pulmonary artery  systolic pressure. The estimated right ventricular systolic pressure is  123456 mmHg.  3. Left atrial size was severely dilated.  4. Right atrial size was moderately dilated.  5. Moderate pleural effusion in the left lateral region.  6. The mitral valve is normal in structure. Mild mitral valve  regurgitation. No evidence of mitral  stenosis.  7. Tricuspid valve regurgitation is moderate.  8. The aortic valve is normal in structure. Aortic valve regurgitation is  not visualized. Mild to moderate aortic valve sclerosis/calcification is  present, without any evidence of aortic stenosis.  9. Moderately dilated pulmonary artery.  10. The inferior vena cava is dilated in size with <50% respiratory  variability, suggesting right atrial pressure of 15 mmHg.  11. No clear vegetation but the RV  pacemaker lead appears thinkened. A  vegetation can not be excluded. Consider TEE.   Assessment & Plan   1.  Chronic ischemic cardiomyopathy-no increased DOE today or activity intolerance.  Recently seen by outpatient PT and received instruction on lower extremity lymphedema.  Weight today 205 pounds Continue torsemide, potassium, metolazone Lymphedema exercises Compression stockings Heart healthy low-sodium diet Daily weights  Essential hypertension- blood pressure today 116/54 Continue losartan Heart healthy low-sodium diet-salty 6 given Increase physical activity as tolerated  Biventricular ICD -heart rate today 74 bpm Followed by pacemaker clinic  Sepsis-no fever, chills, malaise.  Finished IV antibiotics.  PICC line discontinued. Followed by infectious disease  Disposition: Follow-up with Dr. Sallyanne Kuster in 3 months.   Jossie Ng. Ojani Berenson NP-C    08/16/2019, 3:39 PM [address] Office [phone] Fax [phone]

## 2019-08-17 NOTE — Progress Notes (Signed)
Thanks, Jesse 

## 2019-08-18 ENCOUNTER — Inpatient Hospital Stay
Admission: EM | Admit: 2019-08-18 | Discharge: 2019-08-24 | DRG: 871 | Disposition: A | Discharge status: Other Acute Inpatient Hospital | Payer: PPO | Attending: Internal Medicine | Admitting: Internal Medicine

## 2019-08-18 ENCOUNTER — Other Ambulatory Visit: Payer: Self-pay

## 2019-08-18 ENCOUNTER — Emergency Department: Payer: PPO

## 2019-08-18 ENCOUNTER — Encounter: Payer: Self-pay | Admitting: Internal Medicine

## 2019-08-18 DIAGNOSIS — A401 Sepsis due to streptococcus, group B: Principal | ICD-10-CM | POA: Diagnosis present

## 2019-08-18 DIAGNOSIS — I959 Hypotension, unspecified: Secondary | ICD-10-CM | POA: Diagnosis not present

## 2019-08-18 DIAGNOSIS — Z8546 Personal history of malignant neoplasm of prostate: Secondary | ICD-10-CM

## 2019-08-18 DIAGNOSIS — I130 Hypertensive heart and chronic kidney disease with heart failure and stage 1 through stage 4 chronic kidney disease, or unspecified chronic kidney disease: Secondary | ICD-10-CM | POA: Diagnosis not present

## 2019-08-18 DIAGNOSIS — I255 Ischemic cardiomyopathy: Secondary | ICD-10-CM | POA: Diagnosis not present

## 2019-08-18 DIAGNOSIS — I330 Acute and subacute infective endocarditis: Secondary | ICD-10-CM | POA: Diagnosis not present

## 2019-08-18 DIAGNOSIS — E872 Acidosis: Secondary | ICD-10-CM | POA: Diagnosis not present

## 2019-08-18 DIAGNOSIS — L89312 Pressure ulcer of right buttock, stage 2: Secondary | ICD-10-CM | POA: Diagnosis not present

## 2019-08-18 DIAGNOSIS — R6521 Severe sepsis with septic shock: Secondary | ICD-10-CM | POA: Diagnosis not present

## 2019-08-18 DIAGNOSIS — I5023 Acute on chronic systolic (congestive) heart failure: Secondary | ICD-10-CM

## 2019-08-18 DIAGNOSIS — E876 Hypokalemia: Secondary | ICD-10-CM | POA: Diagnosis not present

## 2019-08-18 DIAGNOSIS — I441 Atrioventricular block, second degree: Secondary | ICD-10-CM | POA: Diagnosis present

## 2019-08-18 DIAGNOSIS — D509 Iron deficiency anemia, unspecified: Secondary | ICD-10-CM | POA: Diagnosis present

## 2019-08-18 DIAGNOSIS — N1831 Chronic kidney disease, stage 3a: Secondary | ICD-10-CM | POA: Diagnosis not present

## 2019-08-18 DIAGNOSIS — L89322 Pressure ulcer of left buttock, stage 2: Secondary | ICD-10-CM | POA: Diagnosis not present

## 2019-08-18 DIAGNOSIS — Z951 Presence of aortocoronary bypass graft: Secondary | ICD-10-CM

## 2019-08-18 DIAGNOSIS — E7800 Pure hypercholesterolemia, unspecified: Secondary | ICD-10-CM | POA: Diagnosis present

## 2019-08-18 DIAGNOSIS — R509 Fever, unspecified: Secondary | ICD-10-CM | POA: Diagnosis not present

## 2019-08-18 DIAGNOSIS — E860 Dehydration: Secondary | ICD-10-CM | POA: Diagnosis present

## 2019-08-18 DIAGNOSIS — R609 Edema, unspecified: Secondary | ICD-10-CM | POA: Diagnosis not present

## 2019-08-18 DIAGNOSIS — I5043 Acute on chronic combined systolic (congestive) and diastolic (congestive) heart failure: Secondary | ICD-10-CM | POA: Diagnosis not present

## 2019-08-18 DIAGNOSIS — R6520 Severe sepsis without septic shock: Secondary | ICD-10-CM | POA: Diagnosis not present

## 2019-08-18 DIAGNOSIS — I361 Nonrheumatic tricuspid (valve) insufficiency: Secondary | ICD-10-CM | POA: Diagnosis not present

## 2019-08-18 DIAGNOSIS — I639 Cerebral infarction, unspecified: Secondary | ICD-10-CM | POA: Diagnosis present

## 2019-08-18 DIAGNOSIS — Y95 Nosocomial condition: Secondary | ICD-10-CM | POA: Diagnosis present

## 2019-08-18 DIAGNOSIS — I071 Rheumatic tricuspid insufficiency: Secondary | ICD-10-CM | POA: Diagnosis present

## 2019-08-18 DIAGNOSIS — I252 Old myocardial infarction: Secondary | ICD-10-CM

## 2019-08-18 DIAGNOSIS — I340 Nonrheumatic mitral (valve) insufficiency: Secondary | ICD-10-CM | POA: Diagnosis not present

## 2019-08-18 DIAGNOSIS — N179 Acute kidney failure, unspecified: Secondary | ICD-10-CM | POA: Diagnosis present

## 2019-08-18 DIAGNOSIS — K5520 Angiodysplasia of colon without hemorrhage: Secondary | ICD-10-CM | POA: Diagnosis not present

## 2019-08-18 DIAGNOSIS — E782 Mixed hyperlipidemia: Secondary | ICD-10-CM | POA: Diagnosis present

## 2019-08-18 DIAGNOSIS — D62 Acute posthemorrhagic anemia: Secondary | ICD-10-CM | POA: Diagnosis not present

## 2019-08-18 DIAGNOSIS — I5022 Chronic systolic (congestive) heart failure: Secondary | ICD-10-CM | POA: Diagnosis present

## 2019-08-18 DIAGNOSIS — I5082 Biventricular heart failure: Secondary | ICD-10-CM | POA: Diagnosis present

## 2019-08-18 DIAGNOSIS — B951 Streptococcus, group B, as the cause of diseases classified elsewhere: Secondary | ICD-10-CM | POA: Diagnosis not present

## 2019-08-18 DIAGNOSIS — Z9861 Coronary angioplasty status: Secondary | ICD-10-CM

## 2019-08-18 DIAGNOSIS — K921 Melena: Secondary | ICD-10-CM | POA: Diagnosis present

## 2019-08-18 DIAGNOSIS — E785 Hyperlipidemia, unspecified: Secondary | ICD-10-CM | POA: Diagnosis not present

## 2019-08-18 DIAGNOSIS — I4821 Permanent atrial fibrillation: Secondary | ICD-10-CM | POA: Diagnosis not present

## 2019-08-18 DIAGNOSIS — I351 Nonrheumatic aortic (valve) insufficiency: Secondary | ICD-10-CM | POA: Diagnosis not present

## 2019-08-18 DIAGNOSIS — I2510 Atherosclerotic heart disease of native coronary artery without angina pectoris: Secondary | ICD-10-CM | POA: Diagnosis not present

## 2019-08-18 DIAGNOSIS — D500 Iron deficiency anemia secondary to blood loss (chronic): Secondary | ICD-10-CM | POA: Diagnosis not present

## 2019-08-18 DIAGNOSIS — H903 Sensorineural hearing loss, bilateral: Secondary | ICD-10-CM | POA: Diagnosis present

## 2019-08-18 DIAGNOSIS — Z8719 Personal history of other diseases of the digestive system: Secondary | ICD-10-CM | POA: Diagnosis not present

## 2019-08-18 DIAGNOSIS — I10 Essential (primary) hypertension: Secondary | ICD-10-CM | POA: Diagnosis not present

## 2019-08-18 DIAGNOSIS — K219 Gastro-esophageal reflux disease without esophagitis: Secondary | ICD-10-CM

## 2019-08-18 DIAGNOSIS — Z8679 Personal history of other diseases of the circulatory system: Secondary | ICD-10-CM

## 2019-08-18 DIAGNOSIS — E1122 Type 2 diabetes mellitus with diabetic chronic kidney disease: Secondary | ICD-10-CM | POA: Diagnosis not present

## 2019-08-18 DIAGNOSIS — D649 Anemia, unspecified: Secondary | ICD-10-CM | POA: Diagnosis not present

## 2019-08-18 DIAGNOSIS — R531 Weakness: Secondary | ICD-10-CM | POA: Diagnosis not present

## 2019-08-18 DIAGNOSIS — Z888 Allergy status to other drugs, medicaments and biological substances status: Secondary | ICD-10-CM

## 2019-08-18 DIAGNOSIS — A4101 Sepsis due to Methicillin susceptible Staphylococcus aureus: Secondary | ICD-10-CM | POA: Diagnosis not present

## 2019-08-18 DIAGNOSIS — Z20822 Contact with and (suspected) exposure to covid-19: Secondary | ICD-10-CM | POA: Diagnosis present

## 2019-08-18 DIAGNOSIS — Z87891 Personal history of nicotine dependence: Secondary | ICD-10-CM

## 2019-08-18 DIAGNOSIS — J189 Pneumonia, unspecified organism: Secondary | ICD-10-CM | POA: Diagnosis present

## 2019-08-18 DIAGNOSIS — J153 Pneumonia due to streptococcus, group B: Secondary | ICD-10-CM | POA: Diagnosis not present

## 2019-08-18 DIAGNOSIS — Z8249 Family history of ischemic heart disease and other diseases of the circulatory system: Secondary | ICD-10-CM

## 2019-08-18 DIAGNOSIS — Z8673 Personal history of transient ischemic attack (TIA), and cerebral infarction without residual deficits: Secondary | ICD-10-CM

## 2019-08-18 DIAGNOSIS — R0902 Hypoxemia: Secondary | ICD-10-CM | POA: Diagnosis not present

## 2019-08-18 DIAGNOSIS — R7881 Bacteremia: Secondary | ICD-10-CM | POA: Diagnosis not present

## 2019-08-18 DIAGNOSIS — L89152 Pressure ulcer of sacral region, stage 2: Secondary | ICD-10-CM | POA: Diagnosis not present

## 2019-08-18 DIAGNOSIS — R55 Syncope and collapse: Secondary | ICD-10-CM | POA: Diagnosis not present

## 2019-08-18 DIAGNOSIS — Z95810 Presence of automatic (implantable) cardiac defibrillator: Secondary | ICD-10-CM

## 2019-08-18 DIAGNOSIS — A419 Sepsis, unspecified organism: Secondary | ICD-10-CM

## 2019-08-18 DIAGNOSIS — I25708 Atherosclerosis of coronary artery bypass graft(s), unspecified, with other forms of angina pectoris: Secondary | ICD-10-CM | POA: Diagnosis not present

## 2019-08-18 LAB — COMPREHENSIVE METABOLIC PANEL
ALT: 18 U/L (ref 0–44)
AST: 46 U/L — ABNORMAL HIGH (ref 15–41)
Albumin: 1.9 g/dL — ABNORMAL LOW (ref 3.5–5.0)
Alkaline Phosphatase: 87 U/L (ref 38–126)
Anion gap: 12 (ref 5–15)
BUN: 57 mg/dL — ABNORMAL HIGH (ref 8–23)
CO2: 28 mmol/L (ref 22–32)
Calcium: 7.9 mg/dL — ABNORMAL LOW (ref 8.9–10.3)
Chloride: 99 mmol/L (ref 98–111)
Creatinine, Ser: 2.34 mg/dL — ABNORMAL HIGH (ref 0.61–1.24)
GFR calc Af Amer: 29 mL/min — ABNORMAL LOW (ref >60)
GFR calc non Af Amer: 25 mL/min — ABNORMAL LOW (ref >60)
Glucose, Bld: 115 mg/dL — ABNORMAL HIGH (ref 70–99)
Potassium: 3.5 mmol/L (ref 3.5–5.1)
Sodium: 139 mmol/L (ref 135–145)
Total Bilirubin: 1.1 mg/dL (ref 0.3–1.2)
Total Protein: 4.9 g/dL — ABNORMAL LOW (ref 6.5–8.1)

## 2019-08-18 LAB — CBC WITH DIFFERENTIAL/PLATELET
Abs Immature Granulocytes: 0.07 10*3/uL (ref 0.00–0.07)
Basophils Absolute: 0 10*3/uL (ref 0.0–0.1)
Basophils Relative: 0 %
Eosinophils Absolute: 0 10*3/uL (ref 0.0–0.5)
Eosinophils Relative: 0 %
HCT: 24.4 % — ABNORMAL LOW (ref 39.0–52.0)
Hemoglobin: 7.4 g/dL — ABNORMAL LOW (ref 13.0–17.0)
Immature Granulocytes: 1 %
Lymphocytes Relative: 5 %
Lymphs Abs: 0.6 10*3/uL — ABNORMAL LOW (ref 0.7–4.0)
MCH: 23.8 pg — ABNORMAL LOW (ref 26.0–34.0)
MCHC: 30.3 g/dL (ref 30.0–36.0)
MCV: 78.5 fL — ABNORMAL LOW (ref 80.0–100.0)
Monocytes Absolute: 1 10*3/uL (ref 0.1–1.0)
Monocytes Relative: 9 %
Neutro Abs: 9.4 10*3/uL — ABNORMAL HIGH (ref 1.7–7.7)
Neutrophils Relative %: 85 %
Platelets: 181 10*3/uL (ref 150–400)
RBC: 3.11 MIL/uL — ABNORMAL LOW (ref 4.22–5.81)
RDW: 19 % — ABNORMAL HIGH (ref 11.5–15.5)
WBC: 10.9 10*3/uL — ABNORMAL HIGH (ref 4.0–10.5)
nRBC: 0 % (ref 0.0–0.2)

## 2019-08-18 LAB — CBC
HCT: 23.9 % — ABNORMAL LOW (ref 39.0–52.0)
Hemoglobin: 7.4 g/dL — ABNORMAL LOW (ref 13.0–17.0)
MCH: 23.7 pg — ABNORMAL LOW (ref 26.0–34.0)
MCHC: 31 g/dL (ref 30.0–36.0)
MCV: 76.6 fL — ABNORMAL LOW (ref 80.0–100.0)
Platelets: 160 K/uL (ref 150–400)
RBC: 3.12 MIL/uL — ABNORMAL LOW (ref 4.22–5.81)
RDW: 18.9 % — ABNORMAL HIGH (ref 11.5–15.5)
WBC: 11.1 K/uL — ABNORMAL HIGH (ref 4.0–10.5)
nRBC: 0.2 % (ref 0.0–0.2)

## 2019-08-18 LAB — LACTIC ACID, PLASMA
Lactic Acid, Venous: 2.3 mmol/L (ref 0.5–1.9)
Lactic Acid, Venous: 3.6 mmol/L (ref 0.5–1.9)
Lactic Acid, Venous: 2.3 mmol/L (ref 0.5–1.9)

## 2019-08-18 LAB — PROTIME-INR
INR: 1.6 — ABNORMAL HIGH (ref 0.8–1.2)
Prothrombin Time: 18.1 s — ABNORMAL HIGH (ref 11.4–15.2)

## 2019-08-18 LAB — PROCALCITONIN: Procalcitonin: 52.9 ng/mL

## 2019-08-18 LAB — BRAIN NATRIURETIC PEPTIDE: B Natriuretic Peptide: 588.8 pg/mL — ABNORMAL HIGH (ref 0.0–100.0)

## 2019-08-18 LAB — APTT: aPTT: 41 s — ABNORMAL HIGH (ref 24–36)

## 2019-08-18 LAB — SARS CORONAVIRUS 2 BY RT PCR (HOSPITAL ORDER, PERFORMED IN ~~LOC~~ HOSPITAL LAB): SARS Coronavirus 2: NEGATIVE

## 2019-08-18 MED ORDER — SODIUM CHLORIDE 0.9 % IV BOLUS
500.0000 mL | Freq: Once | INTRAVENOUS | Status: AC
  Administered 2019-08-18: 500 mL via INTRAVENOUS

## 2019-08-18 MED ORDER — ONDANSETRON HCL 4 MG/2ML IJ SOLN: 4.0000 mg | Freq: Three times a day (TID) | INTRAMUSCULAR | Status: DC | PRN

## 2019-08-18 MED ORDER — ALBUMIN HUMAN 25 % IV SOLN
25.0000 g | Freq: Once | INTRAVENOUS | Status: AC
  Administered 2019-08-18: 25 g via INTRAVENOUS
  Filled 2019-08-18: qty 100

## 2019-08-18 MED ORDER — SODIUM CHLORIDE 0.9 % IV SOLN
500.0000 mg | INTRAVENOUS | Status: DC
  Administered 2019-08-18: 500 mg via INTRAVENOUS
  Filled 2019-08-18 (×2): qty 500

## 2019-08-18 MED ORDER — DM-GUAIFENESIN ER 30-600 MG PO TB12: 1.0000 | ORAL_TABLET | Freq: Two times a day (BID) | ORAL | Status: DC | PRN

## 2019-08-18 MED ORDER — VANCOMYCIN HCL 750 MG/150ML IV SOLN: 750.0000 mg | INTRAVENOUS | Status: DC

## 2019-08-18 MED ORDER — SODIUM CHLORIDE 0.9 % IV SOLN
2.0000 g | INTRAVENOUS | Status: DC
  Administered 2019-08-18: 2 g via INTRAVENOUS
  Filled 2019-08-18: qty 2

## 2019-08-18 MED ORDER — ALBUTEROL SULFATE (2.5 MG/3ML) 0.083% IN NEBU: 3.0000 mL | INHALATION_SOLUTION | RESPIRATORY_TRACT | Status: DC | PRN

## 2019-08-18 MED ORDER — SODIUM CHLORIDE 0.9 % IV SOLN
2.0000 g | INTRAVENOUS | Status: DC
  Administered 2019-08-18: 2 g via INTRAVENOUS
  Filled 2019-08-18: qty 20

## 2019-08-18 MED ORDER — SODIUM CHLORIDE 0.9 % IV SOLN: INTRAVENOUS | Status: DC

## 2019-08-18 MED ORDER — ACETAMINOPHEN 325 MG PO TABS
650.0000 mg | ORAL_TABLET | Freq: Four times a day (QID) | ORAL | Status: DC | PRN
  Administered 2019-08-18: 650 mg via ORAL
  Filled 2019-08-18: qty 2

## 2019-08-18 MED ORDER — PANTOPRAZOLE SODIUM 40 MG IV SOLR
40.0000 mg | Freq: Two times a day (BID) | INTRAVENOUS | Status: DC
  Administered 2019-08-18 – 2019-08-23 (×10): 40 mg via INTRAVENOUS
  Filled 2019-08-18 (×11): qty 40

## 2019-08-18 MED ORDER — NOREPINEPHRINE 4 MG/250ML-% IV SOLN
2.0000 ug/min | INTRAVENOUS | Status: DC
  Administered 2019-08-19: 2 ug/min via INTRAVENOUS
  Filled 2019-08-18 (×2): qty 250

## 2019-08-18 MED ORDER — LACTATED RINGERS IV BOLUS
1000.0000 mL | Freq: Once | INTRAVENOUS | Status: AC
  Administered 2019-08-18: 1000 mL via INTRAVENOUS

## 2019-08-18 MED ORDER — VANCOMYCIN HCL 1500 MG/300ML IV SOLN
1500.0000 mg | Freq: Once | INTRAVENOUS | Status: AC
  Administered 2019-08-18: 1500 mg via INTRAVENOUS
  Filled 2019-08-18: qty 300

## 2019-08-18 MED ORDER — SODIUM CHLORIDE 0.9 % IV SOLN
250.0000 mL | INTRAVENOUS | Status: DC
  Administered 2019-08-22 – 2019-08-23 (×2): 250 mL via INTRAVENOUS

## 2019-08-18 NOTE — ED Notes (Signed)
Two unsuccessful attempts to get 2nd IV and collect blood cultures. Lab called for lab tech.

## 2019-08-18 NOTE — H&P (Addendum)
History and Physical    FIN HUPP VZC:[id number] DOB: [birth year] DOA: 08/18/2019  Referring MD/NP/PA:   PCP: [name], MD   Patient coming from:  The patient is coming from home.  At baseline, pt is independent for most of ADL.        Chief Complaint: Fever, chills, cough, shortness of breath, black stool  HPI: Thomas [name] is a 85 y.o. male with medical history significant of bilateral deaf, hypertension, hyperlipidemia, stroke, GERD, CAD, CABG, PAF not on anticoagulants, AICD placement, prostate cancer, epididymitis, 6033, CHF, iron deficiency anemia, peptic ulcer disease, GI bleeding, who presents with fever, chills, cough, shortness breath, black stool.  Pt is deaf. The history is obtained with the help of sign interpreter. Patient states that he started having fever and chills since yesterday.  He also has mild dry cough, mild shortness breath, denies chest pain.  Patient does not have nausea, vomiting, diarrhea or abdominal pain.  Patient reports that in the past several days, he noticed that his stool is black in color.  He is taking iron supplement.  No symptoms of UTI or unilateral weakness.  ED Course: pt was found to have WBC 10.9, lactic acid 2.3, 3.6, negative COVID-19 PCR, worsening renal function, temperature 99.9, soft blood pressure, heart rate 60, RR 20, oxygen saturation 96% on room air.  Chest x-ray showed cardiomegaly and left basilar infiltration.  Patient is admitted to [place] bed as inpatient.  Review of Systems:   General: has fevers, chills, no body weight gain, has poor appetite, has fatigue HEENT: no blurry vision, hearing changes or sore throat Respiratory: has dyspnea, coughing, no wheezing CV: no chest pain, no palpitations GI: no nausea, vomiting, abdominal pain, diarrhea, constipation. Has black stool GU: no dysuria, burning on urination, increased urinary frequency, hematuria  Ext: has leg edema Neuro: no unilateral weakness, numbness, or  tingling, no vision change or hearing loss Skin: no rash, no skin tear. MSK: No muscle spasm, no deformity, no limitation of range of movement in spin Heme: No easy bruising.  Travel history: No recent long distant travel.  Allergy:  Allergies  Allergen Reactions  . Entresto [Sacubitril-Valsartan] Swelling    And bruising of arm  . Phenazopyridine Nausea Only and Other (See Comments)    GI UPSET  . Ramipril Other (See Comments)    unk Other reaction(s): Other (See Comments), Unknown unk    Past Medical History:  Diagnosis Date  . AICD (automatic cardioverter/defibrillator) present   . Biventricular ICD (implantable cardioverter-defibrillator) in place 03/24/2005   Implantation of a [employer]onic Adapta ADDRO1, serial number [id number]  . CHF (congestive heart failure) ([place])   . CKD (chronic kidney disease), stage III   . Coronary artery disease    a. s/p CABG 1986. b. Multiple PCIs/caths. c. 09/2013: s/p PTCA and BMS to SVG-OM.  [name]   . Dysrhythmia   . History of abdominal aortic aneurysm   . History of bleeding peptic ulcer 1980  . History of epididymitis 2013  . HTN (hypertension)   . Hydronephrosis with ureteropelvic junction obstruction   . Hydroureter on left 2009  . Hypertension   . Ischemic cardiomyopathy    a. Prior EF 30-35%, s/p BIV-ICD. b. 09/2013: EF 45-50%.  . Moderate tricuspid regurgitation   . PAF (paroxysmal atrial fibrillation) ([place])   . Presence of permanent cardiac pacemaker   . Prostate cancer ([place])   . Status post coronary artery bypass grafting 1986   LIMA to the LAD,  SVG to OM, SVG to RCA  . Testicular swelling     Past Surgical History:  Procedure Laterality Date  . 2-D echocardiogram  11/20/2011   Ejection fraction 30-35% moderate concentric left ventricular hypertrophy. Left atrium is moderately dilated. Mild MR. Mild or  . BI-VENTRICULAR IMPLANTABLE CARDIOVERTER DEFIBRILLATOR N/A 12/16/2012   Procedure: BI-VENTRICULAR IMPLANTABLE  CARDIOVERTER DEFIBRILLATOR  (CRT-D);  Surgeon: [name], MD;  Location: [facility] CATH LAB;  Service: Cardiovascular;  Laterality: N/A;  . CARDIAC CATHETERIZATION  12/10/2011   SVG to OM widely patent.  LIMA to LAD patent  . CATARACT EXTRACTION W/PHACO Right 10/12/2017   Procedure: CATARACT EXTRACTION PHACO AND INTRAOCULAR LENS PLACEMENT (IOC);  Surgeon: Birder Robson, MD;  Location: [facility] ORS;  Service: Ophthalmology;  Laterality: Right;  [place] 00:57 AP% 15.9 CDE 9.07 Fluid pack lot # [id number]  . COLONOSCOPY N/A 07/13/2018   Procedure: COLONOSCOPY;  Surgeon: Toledo, Benay Pike, MD;  Location: [facility] ENDOSCOPY;  Service: Gastroenterology;  Laterality: N/A;  . CORONARY ARTERY BYPASS GRAFT  1986  . ENTEROSCOPY N/A 09/14/2018   Procedure: ENTEROSCOPY;  Surgeon: Toledo, Benay Pike, MD;  Location: [facility] ENDOSCOPY;  Service: Gastroenterology;  Laterality: N/A;  symptomatic anemia, GI blood loss anemia, melena, positive small bowel capsule endoscopy showing source of bleeding   . ESOPHAGOGASTRODUODENOSCOPY N/A 07/13/2018   Procedure: ESOPHAGOGASTRODUODENOSCOPY (EGD);  Surgeon: Toledo, Benay Pike, MD;  Location: [facility] ENDOSCOPY;  Service: Gastroenterology;  Laterality: N/A;  . ESOPHAGOGASTRODUODENOSCOPY N/A 09/14/2018   Procedure: ESOPHAGOGASTRODUODENOSCOPY (EGD);  Surgeon: Toledo, Benay Pike, MD;  Location: [facility] ENDOSCOPY;  Service: Gastroenterology;  Laterality: N/A;  SIGN LANAGUAGE INTERPRETER  . ESOPHAGOGASTRODUODENOSCOPY N/A 06/21/2019   Procedure: ESOPHAGOGASTRODUODENOSCOPY (EGD);  Surgeon: Toledo, Benay Pike, MD;  Location: [facility] ENDOSCOPY;  Service: Gastroenterology;  Laterality: N/A;  . ESOPHAGOGASTRODUODENOSCOPY (EGD) WITH PROPOFOL N/A 05/27/2018   Procedure: ESOPHAGOGASTRODUODENOSCOPY (EGD) WITH PROPOFOL;  Surgeon: Clarene Essex, MD;  Location: [place];  Service: Endoscopy;  Laterality: N/A;  . INSERT / REPLACE / REMOVE PACEMAKER    . LEFT HEART CATHETERIZATION WITH CORONARY/GRAFT ANGIOGRAM N/A 12/10/2011    Procedure: LEFT HEART CATHETERIZATION WITH Beatrix Fetters;  Surgeon: Sanda Klein, MD;  Location: [place] CATH LAB;  Service: Cardiovascular;  Laterality: N/A;  . LEFT HEART CATHETERIZATION WITH CORONARY/GRAFT ANGIOGRAM N/A 09/25/2013   Procedure: LEFT HEART CATHETERIZATION WITH Beatrix Fetters;  Surgeon: Blane Ohara, MD;  Location: [facility] CATH LAB;  Service: Cardiovascular;  Laterality: N/A;  . Persantine Myoview  05/06/2010   Post-rest ejection fraction 30%. No significant ischemia demonstrated. Compared to previous study there is no significant change.  . TEE WITHOUT CARDIOVERSION N/A 06/22/2019   Procedure: TRANSESOPHAGEAL ECHOCARDIOGRAM (TEE);  Surgeon: Minna Merritts, MD;  Location: [facility] ORS;  Service: Cardiovascular;  Laterality: N/A;  . TRANSURETHRAL RESECTION OF PROSTATE     s/p    Social History:  reports that he quit smoking about 34 years ago. He has never used smokeless tobacco. He reports that he does not drink alcohol or use drugs.  Family History:  Family History  Problem Relation Age of Onset  . Hypertension Father      Prior to Admission medications   Medication Sig Start Date End Date Taking? Authorizing Provider  albuterol (VENTOLIN HFA) 108 (90 Base) MCG/ACT inhaler Inhale 2 puffs into the lungs 4 (four) times daily as needed. Patient taking differently: Inhale 2 puffs into the lungs 4 (four) times daily as needed for wheezing or shortness of breath.  08/02/18   Croitoru, [name] Gobble, MD  [employer] ACETAMINOPHEN EX  ST 500 MG tablet Take 500-1,000 mg by mouth every 6 (six) hours as needed for pain or fever. 04/26/19   [provider]  ferrous sulfate 325 (65 FE) MG tablet Take 1 tablet (325 mg total) by mouth daily for 30 days. 05/28/18   Elodia Florence., MD  ferrous sulfate 325 (65 FE) MG tablet SMARTSIG:1 Tablet(s) By Mouth Every Morning 08/04/19   [provider]  losartan (COZAAR) 25 MG tablet Take 0.5 tablets (12.5 mg total) by mouth daily.  06/26/19   Danford, Suann Larry, MD  metolazone (ZAROXOLYN) 5 MG tablet Take 5 mg by mouth daily as needed. TAKE FOR WEIGHT GAIN >3lbs IN A DAY OR >5Llbs IN A WEEK    [provider]  Multiple Vitamin (MULTIVITAMIN WITH MINERALS) TABS tablet Take 1 tablet by mouth daily. 05/29/19   Ezekiel Slocumb, DO  pantoprazole (PROTONIX) 40 MG tablet Take 1 tablet (40 mg total) by mouth 2 (two) times daily before a meal. 06/25/19   Danford, Suann Larry, MD  potassium chloride SA (KLOR-CON) 20 MEQ tablet Take 1 tablet (20 mEq total) by mouth 2 (two) times daily. 06/25/19   Danford, Suann Larry, MD  simvastatin (ZOCOR) 20 MG tablet TAKE 1 TABLET BY MOUTH EVERY DAY Patient taking differently: Take 20 mg by mouth daily.  04/07/19   Erlene Quan, PA-C  spironolactone (ALDACTONE) 25 MG tablet Take 1 tablet (25 mg total) by mouth daily. 08/01/19 10/30/19  Alisa Graff, FNP  torsemide (DEMADEX) 20 MG tablet Take 2 tablets (40 mg total) by mouth 2 (two) times daily. Patient taking differently: Take 80 mg by mouth daily.  06/25/19   Danford, Suann Larry, MD  triamcinolone cream (KENALOG) 0.1 % Apply 1 application topically 2 (two) times daily as needed for itching. (avoid face, groin and axilla) 04/17/19   [provider]    Physical Exam: Vitals:   08/18/19 1400 08/18/19 1500 08/18/19 1530 08/18/19 1557  BP: (!) 102/53 (!) 105/55 (!) 101/52   Pulse: 63 60 61   Resp: (!) 25 (!) 23 (!) 23   Temp:    97.9 F (36.6 C)  TempSrc:    Oral  SpO2: 98% 96% 96%   Weight:      Height:       General: Not in acute distress HEENT:       Eyes: PERRL, EOMI, no scleral icterus.       ENT: No discharge from the ears and nose, no pharynx injection, no tonsillar enlargement.        Neck: No JVD, no bruit, no mass felt. Heme: No neck lymph node enlargement. Cardiac: S1/S2, RRR, No gallops or rubs. Respiratory: No rales, wheezing, rhonchi or rubs. GI: Soft, nondistended, nontender, no rebound pain, no  organomegaly, BS present. GU: No hematuria Ext: 1+ pitting leg edema bilaterally.  Chronic venous insufficiency change in both legs. 1+DP/PT pulse bilaterally. Musculoskeletal: No joint deformities, No joint redness or warmth, no limitation of ROM in spin. Skin: No rashes.  Neuro: Alert, oriented X3, cranial nerves II-XII grossly intact, moves all extremities normally.  Psych: Patient is not psychotic, no suicidal or hemocidal ideation.  Labs on Admission: I have personally reviewed following labs and imaging studies  CBC: Recent Labs  Lab 08/18/19 1353  WBC 10.9*  NEUTROABS 9.4*  HGB 7.4*  HCT 24.4*  MCV 78.5*  PLT 481   Basic Metabolic Panel: Recent Labs  Lab 08/18/19 1353  NA 139  K 3.5  CL 99  CO2 28  GLUCOSE 115*  BUN 57*  CREATININE 2.34*  CALCIUM 7.9*   GFR: Estimated Creatinine Clearance: 28.3 mL/min (A) (by C-G formula based on SCr of 2.34 mg/dL (H)). Liver Function Tests: Recent Labs  Lab 08/18/19 1353  AST 46*  ALT 18  ALKPHOS 87  BILITOT 1.1  PROT 4.9*  ALBUMIN 1.9*   No results for input(s): LIPASE, AMYLASE in the last 168 hours. No results for input(s): AMMONIA in the last 168 hours. Coagulation Profile: No results for input(s): INR, PROTIME in the last 168 hours. Cardiac Enzymes: No results for input(s): CKTOTAL, CKMB, CKMBINDEX, TROPONINI in the last 168 hours. BNP (last 3 results) No results for input(s): PROBNP in the last 8760 hours. HbA1C: No results for input(s): HGBA1C in the last 72 hours. CBG: No results for input(s): GLUCAP in the last 168 hours. Lipid Profile: No results for input(s): CHOL, HDL, LDLCALC, TRIG, CHOLHDL, LDLDIRECT in the last 72 hours. Thyroid Function Tests: No results for input(s): TSH, T4TOTAL, FREET4, T3FREE, THYROIDAB in the last 72 hours. Anemia Panel: No results for input(s): VITAMINB12, FOLATE, FERRITIN, TIBC, IRON, RETICCTPCT in the last 72 hours. Urine analysis:    Component Value Date/Time    COLORURINE YELLOW (A) 06/16/2019 2304   APPEARANCEUR HAZY (A) 06/16/2019 2304   APPEARANCEUR Clear 12/13/2017 0918   LABSPEC 1.014 06/16/2019 2304   LABSPEC 1.021 11/19/2011 1501   PHURINE 5.0 06/16/2019 2304   GLUCOSEU NEGATIVE 06/16/2019 2304   GLUCOSEU Negative 11/19/2011 1501   HGBUR NEGATIVE 06/16/2019 2304   BILIRUBINUR NEGATIVE 06/16/2019 2304   BILIRUBINUR Negative 12/13/2017 0918   BILIRUBINUR Negative 11/19/2011 1501   KETONESUR NEGATIVE 06/16/2019 2304   PROTEINUR 30 (A) 06/16/2019 2304   NITRITE NEGATIVE 06/16/2019 2304   LEUKOCYTESUR NEGATIVE 06/16/2019 2304   LEUKOCYTESUR Negative 11/19/2011 1501   Sepsis Labs: @LABRCNTIP (procalcitonin:4,lacticidven:4) ) Recent Results (from the past 240 hour(s))  SARS Coronavirus 2 by RT PCR (hospital order, performed in [place] hospital lab) Nasopharyngeal Nasopharyngeal Swab     Status: None   Collection Time: 08/18/19  2:50 PM   Specimen: Nasopharyngeal Swab  Result Value Ref Range Status   SARS Coronavirus 2 NEGATIVE NEGATIVE Final    Comment: (NOTE) SARS-CoV-2 target nucleic acids are NOT DETECTED. The SARS-CoV-2 RNA is generally detectable in upper and lower respiratory specimens during the acute phase of infection. The lowest concentration of SARS-CoV-2 viral copies this assay can detect is 250 copies / mL. A negative result does not preclude SARS-CoV-2 infection and should not be used as the sole basis for treatment or other patient management decisions.  A negative result may occur with improper specimen collection / handling, submission of specimen other than nasopharyngeal swab, presence of viral mutation(s) within the areas targeted by this assay, and inadequate number of viral copies (<250 copies / mL). A negative result must be combined with clinical observations, patient history, and epidemiological information. Fact Sheet for Patients:   StrictlyIdeas.no Fact Sheet for Healthcare  Providers: BankingDealers.co.za This test is not yet approved or cleared  by the [place] FDA and has been authorized for detection and/or diagnosis of SARS-CoV-2 by FDA under an Emergency Use Authorization (EUA).  This EUA will remain in effect (meaning this test can be used) for the duration of the COVID-19 declaration under Section 564(b)(1) of the Act, 21 U.S.C. section 360bbb-3(b)(1), unless the authorization is terminated or revoked sooner. Performed at [facility], [address] [place], [place] [address]  Radiological Exams on Admission: DG Chest Port 1 View  Result Date: 08/18/2019 CLINICAL DATA:  Fever and weakness EXAM: PORTABLE CHEST 1 VIEW COMPARISON:  July 14, 2019 FINDINGS: There is cardiomegaly with pulmonary vascularity normal. Pacemaker leads are attached to right atrium, right ventricle, and coronary sinus. There is consolidation in the left lower lobe with small left pleural effusion. There is a small right pleural effusion with slight right base atelectasis. Patient is status post internal mammary bypass grafting. There is aortic atherosclerosis. No adenopathy. There is arthropathy in each shoulder. IMPRESSION: Cardiomegaly. Airspace opacity consistent with pneumonia left base. Small pleural effusions bilaterally. Postoperative changes noted. Aortic Atherosclerosis (ICD10-I70.0). Electronically Signed   By: Lowella Grip III M.D.   On: 08/18/2019 14:20     EKG: Independently reviewed.  Paced rhythm, QTC 473  Assessment/Plan Principal Problem:   HCAP (healthcare-associated pneumonia) Active Problems:   CAD Status post coronary artery bypass grafting: 1995   Permanent atrial fibrillation (HCC)   Benign essential HTN   Acid reflux   HLD (hyperlipidemia)   Stroke (cerebrum) (HCC)   Chronic systolic CHF (congestive heart failure) (HCC)   Iron deficiency anemia due to chronic blood loss   Sepsis (HCC)   Acute renal  failure superimposed on stage 3a chronic kidney disease (HCC)   Black stool   Iron deficiency anemia   Sepsis due to HCAP (healthcare-associated pneumonia): Patient meets criteria for sepsis with leukocytosis, fever, lactic acid elevated at 2.3-3.6.  Currently blood pressure soft, hemodynamically stable.  - Will admit to progressive bed as inpt - IV Vancomycin and cefepime (patient received 1 dose of Rocephin and azithromycin in ED) - Mucinex for cough  - Bronchodilators - Urine legionella and S. pneumococcal antigen - Follow up blood culture x2, sputum culture  - will get Procalcitonin and trend lactic acid level per sepsis protocol - IVF: 500 mL of NS bolus in ED, followed by 75 mL per hour of NS (patient has CHF with EF of 40-45%, limiting aggressive IV fluids treatment)  CAD Status post coronary artery bypass grafting: 1995. No CP. -on zocor  Permanent atrial fibrillation ([place]): not on anticoagulants.  Heart rate 60. Pt is not on nodal blockers -Cardiac monitoring  Benign essential HTN: Blood pressures are soft -Hold Cozaar, metolazone, torsemide, spironolactone  Acid reflux -Protonix  HLD (hyperlipidemia) -Zocor  Stroke (cerebrum) (HCC) -On Zocor  Chronic systolic CHF (congestive heart failure) ([place]): 2D echo on 06/22/2019 showed EF of 40-50%.  Patient has 1+ leg edema, elevated BNP 588, but no obvious pulmonary edema chest x-ray.  Patient is at high risk of developing CHF exacerbation. -Hold diuretics due to sepsis and elevated lactic acid level  Iron deficiency anemia due to chronic blood loss: Patient is on iron supplement.  Patient reports that he has black stool in the past several days.  Not sure if this is new GI bleeding or due to iron supplement use.  Hemoglobin 8.1 --> 7.4 -Start Protonix 40 mg twice daily diarrhea -Check PTT/INR/type screen -f/u CBC every 6 hours.  -If hemoglobin less than 7.0, will transfuse with blood -Follow-up FOBT  Acute renal failure  superimposed on stage 3a chronic kidney disease ([place]): Baseline Cre is 1.0- 1.3, pt's Cre is 2.34 and BUN 57 on admission. Likely due to prerenal secondary to dehydration and continuation of ARB, diuretics - IVF as above - Follow up renal function by BMP - Avoid using renal toxic medications, hypotension and contrast dye (or carefully use) - Hold Cozaar, metolazone,  torsemide, spironolactone           DVT ppx: SCD Code Status: Full code Family Communication: not done, no family member is at bed side.    Disposition Plan:  Anticipate discharge back to previous environment Consults called:  none Admission status: Med-surg bed as inpt       Status is: Inpatient  Remains inpatient appropriate because:Inpatient level of care appropriate due to severity of illness.  Patient has multiple comorbidities, now presents with sepsis due to HCAP.  Patient also has black stool, need to rule out GI bleeding.  Patient's presentation is highly complicated.  Patient is at high risk of deteriorating.  Need to be treated in hospital for at least 2 days   Dispo: The patient is from: Home              Anticipated d/c is to: Home              Anticipated d/c date is: 2 days              Patient currently is not medically stable to d/c.           Date of Service 08/18/2019    Ivor Costa [facility]   If 7PM-7AM, please contact night-coverage [email or web address] 08/18/2019, 6:39 PM

## 2019-08-18 NOTE — ED Notes (Signed)
Wife leaving bedside, password set for info sharing via  Video phone since pt AND wife are deaf

## 2019-08-18 NOTE — Consult Note (Signed)
Pharmacy Antibiotic Note  Thomas [name] is a 85 y.o. male admitted on 08/18/2019 with pneumonia.  Pharmacy has been consulted for vancomycin and cefepime dosing. His renal function is significantly lower than his baseline level  Plan:  1) start vancomycin 750 mg IV Q 24 hrs following a 1500 mg loading dose SCr used: 2.34 T1/2: 23.9 h Css (calculated): 23.9/12.6 mcg/mL  2) start cefepime 2 grams IV every 24 hours  Height: 6\' 2"  (188 cm) Weight: 89.5 kg (197 lb 4.8 oz) IBW/kg (Calculated) : 82.2  Temp (24hrs), Avg:99.9 F (37.7 C), Min:99.9 F (37.7 C), Max:99.9 F (37.7 C)  Recent Labs  Lab 08/18/19 1353  WBC 10.9*  CREATININE 2.34*  LATICACIDVEN 2.3*    Estimated Creatinine Clearance: 28.3 mL/min (A) (by C-G formula based on SCr of 2.34 mg/dL (H)).    Allergies  Allergen Reactions  . Entresto [Sacubitril-Valsartan] Swelling    And bruising of arm  . Phenazopyridine Nausea Only and Other (See Comments)    GI UPSET  . Ramipril Other (See Comments)    unk Other reaction(s): Other (See Comments), Unknown unk    Antimicrobials this admission: cefepime 6/4 >>  vancomycin 6/4 >>   Microbiology results: 6/4 BCx: pending 6/4 UCx: pending  6/4 SARS CoV-2: pending   Thank you for allowing pharmacy to be a part of this patient's care.  Dallie Piles 08/18/2019 3:27 PM

## 2019-08-18 NOTE — ED Provider Notes (Signed)
[facility] Emergency Department Provider Note   ____________________________________________    I have reviewed the triage vital signs and the nursing notes.   HISTORY  Chief Complaint Weakness  Sign language interpreter used   HPI Thomas [name] is a 85 y.o. male with a history of significant CHF, CKD, coronary artery disease, paroxysmal atrial fibrillation who presents with increased weakness, febrile.  Patient is known to me, has significant CHF and significant difficulty with volume overload and low blood pressures.  Is managed by Dr. Rockey Situ of cardiology.  Overall he reports that he feels quite weak, mild cough noted, some shortness of breath.  Temperature 101.3 this a.m.  Past Medical History:  Diagnosis Date  . AICD (automatic cardioverter/defibrillator) present   . Biventricular ICD (implantable cardioverter-defibrillator) in place 03/24/2005   Implantation of a Medtronic Adapta ADDRO1, serial number [id number]  . CHF (congestive heart failure) ([place])   . CKD (chronic kidney disease), stage III   . Coronary artery disease    a. s/p CABG 1986. b. Multiple PCIs/caths. c. 09/2013: s/p PTCA and BMS to SVG-OM.  [name]   . Dysrhythmia   . History of abdominal aortic aneurysm   . History of bleeding peptic ulcer 1980  . History of epididymitis 2013  . HTN (hypertension)   . Hydronephrosis with ureteropelvic junction obstruction   . Hydroureter on left 2009  . Hypertension   . Ischemic cardiomyopathy    a. Prior EF 30-35%, s/p BIV-ICD. b. 09/2013: EF 45-50%.  . Moderate tricuspid regurgitation   . PAF (paroxysmal atrial fibrillation) ([employer])   . Presence of permanent cardiac pacemaker   . Prostate cancer ([place])   . Status post coronary artery bypass grafting 1986   LIMA to the LAD, SVG to OM, SVG to RCA  . Testicular swelling     Patient Active Problem List   Diagnosis Date Noted  . HCAP (healthcare-associated pneumonia) 08/18/2019  . Sepsis  ([place]) 08/18/2019  . Acute renal failure superimposed on stage 3a chronic kidney disease ([place]) 08/18/2019  . Group B streptococcal bacteriuria 06/18/2019  . AKI (acute kidney injury) ([place]) 06/18/2019  . Lactic acidosis 06/18/2019  . Pressure ulcer, stage 2 ([place]) 06/18/2019  . Bacteremia 06/17/2019  . SIRS (systemic inflammatory response syndrome) ([place]) 06/16/2019  . Leg swelling   . Decompensated hepatic cirrhosis ([place]) 05/22/2019  . Anasarca 05/19/2019  . CRI (chronic renal insufficiency), stage 3 (moderate) 01/30/2019  . Iron deficiency anemia due to chronic blood loss 11/02/2018  . Coronary artery disease of bypass graft of native heart with stable angina pectoris ([place]) 11/02/2018  . Pure hypercholesterolemia 11/02/2018  . Biventricular automatic implantable cardioverter defibrillator in situ 11/02/2018  . GIB (gastrointestinal bleeding) 09/14/2018  . Hypotension 08/14/2018  . Acute on chronic systolic CHF (congestive heart failure) ([place]) 08/12/2018  . Acute on chronic combined systolic and diastolic CHF (congestive heart failure) ([place])   . GI bleed 05/26/2018  . Occult GI bleeding 05/25/2018  . Normocytic anemia 05/25/2018  . Elevated troponin 05/25/2018  . Anemia   . Lymphedema 02/28/2018  . Weakness 07/15/2016  . Fatigue 07/15/2016  . Stroke (cerebrum) ([place]) 10/31/2015  . Bulbous urethral stricture 09/18/2015  . Bilateral deafness 08/19/2015  . Bilateral cataracts 08/19/2015  . Acid reflux 08/19/2015  . HLD (hyperlipidemia) 08/19/2015  . Essential hypertension 08/19/2015  . Myocardial infarction ([place]) 08/19/2015  . Calculus of kidney 08/19/2015  . Artificial cardiac pacemaker 08/19/2015  . Gastroduodenal ulcer 08/19/2015  . Dupuytren's  contracture of foot 08/19/2015  . Malignant neoplasm of prostate ([place]) 08/19/2015  . Microhematuria 08/19/2015  . CAD S/P multiple PCIs 02/22/2015  . Status post coronary artery bypass grafting 02/22/2015  . Benign essential HTN  04/02/2014  . Hematochezia 02/20/2014  . Tricuspid valve regurgitation, nonrheumatic 02/20/2014  . Mobitz type II atrioventricular block 12/12/2013  . Long term current use of anticoagulant 10/18/2013  . Cardiomyopathy, ischemic 11/11/2012  . CAD Status post coronary artery bypass grafting: 1995 11/11/2012  . Permanent atrial fibrillation ([place]) 11/11/2012  . Biventricular ICD in place 11/11/2012  . Chronic combined systolic and diastolic heart failure ([place]) 11/11/2012    Past Surgical History:  Procedure Laterality Date  . 2-D echocardiogram  11/20/2011   Ejection fraction 30-35% moderate concentric left ventricular hypertrophy. Left atrium is moderately dilated. Mild MR. Mild or  . BI-VENTRICULAR IMPLANTABLE CARDIOVERTER DEFIBRILLATOR N/A 12/16/2012   Procedure: BI-VENTRICULAR IMPLANTABLE CARDIOVERTER DEFIBRILLATOR  (CRT-D);  Surgeon: [name], MD;  Location: [facility] CATH LAB;  Service: Cardiovascular;  Laterality: N/A;  . CARDIAC CATHETERIZATION  12/10/2011   SVG to OM widely patent.  LIMA to LAD patent  . CATARACT EXTRACTION W/PHACO Right 10/12/2017   Procedure: CATARACT EXTRACTION PHACO AND INTRAOCULAR LENS PLACEMENT (IOC);  Surgeon: Birder Robson, MD;  Location: [facility] ORS;  Service: Ophthalmology;  Laterality: Right;  [place] 00:57 AP% 15.9 CDE 9.07 Fluid pack lot # [id number]  . COLONOSCOPY N/A 07/13/2018   Procedure: COLONOSCOPY;  Surgeon: Toledo, Benay Pike, MD;  Location: [facility] ENDOSCOPY;  Service: Gastroenterology;  Laterality: N/A;  . CORONARY ARTERY BYPASS GRAFT  1986  . ENTEROSCOPY N/A 09/14/2018   Procedure: ENTEROSCOPY;  Surgeon: Toledo, Benay Pike, MD;  Location: [facility] ENDOSCOPY;  Service: Gastroenterology;  Laterality: N/A;  symptomatic anemia, GI blood loss anemia, melena, positive small bowel capsule endoscopy showing source of bleeding   . ESOPHAGOGASTRODUODENOSCOPY N/A 07/13/2018   Procedure: ESOPHAGOGASTRODUODENOSCOPY (EGD);  Surgeon: Toledo, Benay Pike, MD;  Location: [facility]  ENDOSCOPY;  Service: Gastroenterology;  Laterality: N/A;  . ESOPHAGOGASTRODUODENOSCOPY N/A 09/14/2018   Procedure: ESOPHAGOGASTRODUODENOSCOPY (EGD);  Surgeon: Toledo, Benay Pike, MD;  Location: [facility] ENDOSCOPY;  Service: Gastroenterology;  Laterality: N/A;  SIGN LANAGUAGE INTERPRETER  . ESOPHAGOGASTRODUODENOSCOPY N/A 06/21/2019   Procedure: ESOPHAGOGASTRODUODENOSCOPY (EGD);  Surgeon: Toledo, Benay Pike, MD;  Location: [facility] ENDOSCOPY;  Service: Gastroenterology;  Laterality: N/A;  . ESOPHAGOGASTRODUODENOSCOPY (EGD) WITH PROPOFOL N/A 05/27/2018   Procedure: ESOPHAGOGASTRODUODENOSCOPY (EGD) WITH PROPOFOL;  Surgeon: Clarene Essex, MD;  Location: [place];  Service: Endoscopy;  Laterality: N/A;  . INSERT / REPLACE / REMOVE PACEMAKER    . LEFT HEART CATHETERIZATION WITH CORONARY/GRAFT ANGIOGRAM N/A 12/10/2011   Procedure: LEFT HEART CATHETERIZATION WITH Beatrix Fetters;  Surgeon: Sanda Klein, MD;  Location: [place] CATH LAB;  Service: Cardiovascular;  Laterality: N/A;  . LEFT HEART CATHETERIZATION WITH CORONARY/GRAFT ANGIOGRAM N/A 09/25/2013   Procedure: LEFT HEART CATHETERIZATION WITH Beatrix Fetters;  Surgeon: Blane Ohara, MD;  Location: [facility] CATH LAB;  Service: Cardiovascular;  Laterality: N/A;  . Persantine Myoview  05/06/2010   Post-rest ejection fraction 30%. No significant ischemia demonstrated. Compared to previous study there is no significant change.  . TEE WITHOUT CARDIOVERSION N/A 06/22/2019   Procedure: TRANSESOPHAGEAL ECHOCARDIOGRAM (TEE);  Surgeon: Minna Merritts, MD;  Location: [facility] ORS;  Service: Cardiovascular;  Laterality: N/A;  . TRANSURETHRAL RESECTION OF PROSTATE     s/p    Prior to Admission medications   Medication Sig Start Date End Date Taking? Authorizing Provider  albuterol (VENTOLIN HFA) 108 (90 Base)  MCG/ACT inhaler Inhale 2 puffs into the lungs 4 (four) times daily as needed. Patient taking differently: Inhale 2 puffs into the lungs 4 (four) times daily as  needed for wheezing or shortness of breath.  08/02/18   Croitoru, Mihai, MD  [employer] ACETAMINOPHEN EX ST 500 MG tablet Take 500-1,000 mg by mouth every 6 (six) hours as needed for pain or fever. 04/26/19   [provider]  ferrous sulfate 325 (65 FE) MG tablet Take 1 tablet (325 mg total) by mouth daily for 30 days. 05/28/18   Elodia Florence., MD  ferrous sulfate 325 (65 FE) MG tablet SMARTSIG:1 Tablet(s) By Mouth Every Morning 08/04/19   [provider]  losartan (COZAAR) 25 MG tablet Take 0.5 tablets (12.5 mg total) by mouth daily. 06/26/19   Danford, Suann Larry, MD  metolazone (ZAROXOLYN) 5 MG tablet Take 5 mg by mouth daily as needed. TAKE FOR WEIGHT GAIN >3lbs IN A DAY OR >5Llbs IN A WEEK    [provider]  Multiple Vitamin (MULTIVITAMIN WITH MINERALS) TABS tablet Take 1 tablet by mouth daily. 05/29/19   Ezekiel Slocumb, DO  pantoprazole (PROTONIX) 40 MG tablet Take 1 tablet (40 mg total) by mouth 2 (two) times daily before a meal. 06/25/19   Danford, Suann Larry, MD  potassium chloride SA (KLOR-CON) 20 MEQ tablet Take 1 tablet (20 mEq total) by mouth 2 (two) times daily. 06/25/19   Danford, Suann Larry, MD  simvastatin (ZOCOR) 20 MG tablet TAKE 1 TABLET BY MOUTH EVERY DAY Patient taking differently: Take 20 mg by mouth daily.  04/07/19   Erlene Quan, PA-C  spironolactone (ALDACTONE) 25 MG tablet Take 1 tablet (25 mg total) by mouth daily. 08/01/19 10/30/19  Alisa Graff, FNP  torsemide (DEMADEX) 20 MG tablet Take 2 tablets (40 mg total) by mouth 2 (two) times daily. Patient taking differently: Take 80 mg by mouth daily.  06/25/19   Danford, Suann Larry, MD  triamcinolone cream (KENALOG) 0.1 % Apply 1 application topically 2 (two) times daily as needed for itching. (avoid face, groin and axilla) 04/17/19   [provider]     Allergies Entresto [sacubitril-valsartan], Phenazopyridine, and Ramipril  Family History  Problem Relation Age of Onset  .  Hypertension Father     Social History Social History   Tobacco Use  . Smoking status: Former Smoker    Quit date: 03/15/1985    Years since quitting: 34.4  . Smokeless tobacco: Never Used  Substance Use Topics  . Alcohol use: No    Comment: occas.  . Drug use: No    Review of Systems  Constitutional: Fever as above Eyes: No visual changes.  ENT: No sore throat. Cardiovascular: Denies chest pain. Respiratory: As above Gastrointestinal: No abdominal pain.  Genitourinary: Negative for dysuria. Musculoskeletal: Negative for back pain. Skin: Negative for rash. Neurological: Negative for headache   ____________________________________________   PHYSICAL EXAM:  VITAL SIGNS: ED Triage Vitals  Enc Vitals Group     BP 08/18/19 1334 (!) 95/53     Pulse Rate 08/18/19 1334 60     Resp 08/18/19 1334 20     Temp 08/18/19 1334 99.9 F (37.7 C)     Temp Source 08/18/19 1334 Oral     SpO2 08/18/19 1334 96 %     Weight 08/18/19 1346 89.5 kg (197 lb 4.8 oz)     Height 08/18/19 1346 1.88 m (6\' 2" )     Head Circumference --  Peak Flow --      Pain Score --      Pain Loc --      Pain Edu? --      Excl. in [place]? --     Constitutional: Alert and oriented.   Nose: No congestion/rhinnorhea. Mouth/Throat: Mucous membranes are moist.    Cardiovascular: Normal rate, regular rhythm. Grossly normal heart sounds.  Good peripheral circulation. Respiratory: Normal respiratory effort.  No retractions.  Bibasilar Rales Gastrointestinal: Soft and nontender. No distention.    Musculoskeletal: 2+ edema bilaterally warm and well perfused Neurologic:  No gross focal neurologic deficits are appreciated.  Skin:  Skin is warm, dry and intact. No rash noted. Psychiatric: Mood and affect are normal. Speech and behavior are normal.  ____________________________________________   LABS (all labs ordered are listed, but only abnormal results are displayed)  Labs Reviewed  LACTIC ACID,  PLASMA - Abnormal; Notable for the following components:      Result Value   Lactic Acid, Venous 2.3 (*)    All other components within normal limits  COMPREHENSIVE METABOLIC PANEL - Abnormal; Notable for the following components:   Glucose, Bld 115 (*)    BUN 57 (*)    Creatinine, Ser 2.34 (*)    Calcium 7.9 (*)    Total Protein 4.9 (*)    Albumin 1.9 (*)    AST 46 (*)    GFR calc non Af Amer 25 (*)    GFR calc Af Amer 29 (*)    All other components within normal limits  CBC WITH DIFFERENTIAL/PLATELET - Abnormal; Notable for the following components:   WBC 10.9 (*)    RBC 3.11 (*)    Hemoglobin 7.4 (*)    HCT 24.4 (*)    MCV 78.5 (*)    MCH 23.8 (*)    RDW 19.0 (*)    Neutro Abs 9.4 (*)    Lymphs Abs 0.6 (*)    All other components within normal limits  BRAIN NATRIURETIC PEPTIDE - Abnormal; Notable for the following components:   B Natriuretic Peptide 588.8 (*)    All other components within normal limits  CULTURE, BLOOD (ROUTINE X 2)  CULTURE, BLOOD (ROUTINE X 2)  URINE CULTURE  SARS CORONAVIRUS 2 BY RT PCR (HOSPITAL ORDER, [facility])  PROCALCITONIN  LACTIC ACID, PLASMA  URINALYSIS, COMPLETE (UACMP) WITH MICROSCOPIC   ____________________________________________  EKG   ____________________________________________  RADIOLOGY  Chest x-ray demonstrates cardiomegaly, airspace opacity consistent with pneumonia, reviewed by me ____________________________________________   PROCEDURES  Procedure(s) performed: No  Procedures   Critical Care performed: yes  CRITICAL CARE Performed by: Lavonia Drafts   Total critical care time: 30 minutes  Critical care time was exclusive of separately billable procedures and treating other patients.  Critical care was necessary to treat or prevent imminent or life-threatening deterioration.  Critical care was time spent personally by me on the following activities: development of treatment plan  with patient and/or surrogate as well as nursing, discussions with consultants, evaluation of patient's response to treatment, examination of patient, obtaining history from patient or surrogate, ordering and performing treatments and interventions, ordering and review of laboratory studies, ordering and review of radiographic studies, pulse oximetry and re-evaluation of patient's condition.  ____________________________________________   INITIAL IMPRESSION / ASSESSMENT AND PLAN / ED COURSE  Pertinent labs & imaging results that were available during my care of the patient were reviewed by me and considered in my medical decision making (see  chart for details).  Patient with significant CHF with borderline blood pressure presents with fever, diffuse weakness, mild cough.  Differential includes COVID-19, pneumonia, sepsis .chest x-ray viewed by me, demonstrates likely pneumonia.  Code sepsis called, will treat with Rocephin and azithromycin.  Lactic is 2.3.  Patient's blood pressure is essentially at his baseline, at this time we will hold on aggressive fluid resuscitation given his significant history.    Lab work is significant for elevated white blood cell count of 10.9, lactic acid of 2.3, elevated BUN/creatinine ratio 57/2.34, typically his creatinine is normal, this is consistent with acute kidney injury, likely related to dehydration versus sepsis  We will contact the hospitalist for admission    ____________________________________________   FINAL CLINICAL IMPRESSION(S) / ED DIAGNOSES  Final diagnoses:  Sepsis with acute renal failure without septic shock, due to unspecified organism, unspecified acute renal failure type [facility])        Note:  This document was prepared using Dragon voice recognition software and may include unintentional dictation errors.   Lavonia Drafts, MD 08/18/19 1515

## 2019-08-18 NOTE — ED Notes (Signed)
Pt's wife at bedside, wife also deaf, interpreter used

## 2019-08-18 NOTE — ED Notes (Signed)
HOB flat d/t low BP

## 2019-08-18 NOTE — ED Notes (Signed)
Report given to Anna RN.

## 2019-08-18 NOTE — ED Triage Notes (Signed)
Per EMS report, Patient has c/o weakness since last night and was febrile this morning. Patient needs sign language interpreter. Patient was able to stand and bear weight to pivot to stretcher.   B/p 93/43 Pulse 76 Pulse 93% Temp. 101.3 oral

## 2019-08-18 NOTE — ED Notes (Signed)
Dr. Corky Downs aware of Lactic of 2.3

## 2019-08-18 NOTE — ED Notes (Signed)
Patient given warm blanket. Patient given ginger ale per Dr. Corky Downs.

## 2019-08-18 NOTE — Progress Notes (Signed)
Cross Cover Brief Note Notified by RN patient with hypotension, paced rhythm following 3 PVC run .  Only 1200 fluids given with sepsis protocol.  Albumin very low at 1.9.  Albumin and additional liter of LR ordered.  LR complete, albumin infusing but minimal improvement, MAP now 63.  Consulted intensivist. Discussed with Marda Stalker NP need for ICU and pressors.  Additional lactic acid and mag level pending

## 2019-08-18 NOTE — ED Notes (Signed)
Family contacted sign language interpreter who has not arrived yet.

## 2019-08-18 NOTE — ED Notes (Signed)
Sign language interpreter [name] at bedside.

## 2019-08-18 NOTE — Consult Note (Signed)
Name: Thomas [name] MRN: [id number] DOB: June 20, 1934    ADMISSION DATE:  08/18/2019 CONSULTATION DATE: 08/18/2019  REFERRING MD : Sharion Settler, NP  CHIEF COMPLAINT: Hypotension   BRIEF PATIENT DESCRIPTION:  85 yo male admitted with septic shock secondary to pneumonia and group B strep bacteremia requiring levophed gtt   SIGNIFICANT EVENTS/STUDIES:  06/4: Pt initially admitted to the progressive care unit, but remained in the ER pending bed availability 06/5: Pt developed hypotension requiring levophed gtt, therefore pt changed to ICU status and PCCM contacted to assist with management   HISTORY OF PRESENT ILLNESS:  This is an 85 yo male with a PMH of CABG (1986), Prostate Cancer, Pacemaker, Paroxysmal Atrial Fibrillation, Moderate Tricuspid Regurgitation, Ischemic Cardiomyopathy, HTN, Hydronephrosis with Ureteropelvic Junction Obstruction, Epididymitis, Bleeding Peptic Ulcer, Abdominal Aortic Aneurysm, Deafness, CAD, Stage III CKD, CHF, and AICD.  He presented to [facility] ER on 06/4 with weakness (onset 06/3) and fever (temp 101.3 the morning of 06/4).  Pt also endorsed some shortness of breath and a mild cough. ER labs were: glucose 115, BUN 57, creatinine 2.34, albumin 1.9, BNP 588.8, lactic acid 2.3, pct 52.90, wbc 10.9, hgb 7.4, and  platelets 160.  COVID-19 negative, however CXR concerning for pneumonia.  Pt received vancomycin, cefepime, 500 ml NS bolus, and 1L LR bolus.  He was initially admitted to the progressive care unit per hospitalist team.  However, while awaiting in the ER for progressive care bed availability he became hypotensive with sbp 70-low 80's.  He received 25 g of albumin, but remained hypotensive requiring levophed gtt.  PCCM team consulted per hospitalist team to assist with management.    PAST MEDICAL HISTORY :   has a past medical history of AICD (automatic cardioverter/defibrillator) present, Biventricular ICD (implantable cardioverter-defibrillator) in place  (03/24/2005), CHF (congestive heart failure) ([place]), CKD (chronic kidney disease), stage III, Coronary artery disease, Deaf, Dysrhythmia, History of abdominal aortic aneurysm, History of bleeding peptic ulcer (1980), History of epididymitis (2013), HTN (hypertension), Hydronephrosis with ureteropelvic junction obstruction, Hydroureter on left (2009), Hypertension, Ischemic cardiomyopathy, Moderate tricuspid regurgitation, PAF (paroxysmal atrial fibrillation) ([place]), Presence of permanent cardiac pacemaker, Prostate cancer ([place]), Status post coronary artery bypass grafting (1986), and Testicular swelling.  has a past surgical history that includes Coronary artery bypass graft (1986); Insert / replace / remove pacemaker; Transurethral resection of prostate; Cardiac catheterization (12/10/2011); 2-D echocardiogram (11/20/2011); Persantine Myoview (05/06/2010); left heart catheterization with coronary/graft angiogram (N/A, 12/10/2011); bi-ventricular implantable cardioverter defibrillator (N/A, 12/16/2012); left heart catheterization with coronary/graft angiogram (N/A, 09/25/2013); Cataract extraction w/PHACO (Right, 10/12/2017); Esophagogastroduodenoscopy (egd) with propofol (N/A, 05/27/2018); Esophagogastroduodenoscopy (N/A, 07/13/2018); Colonoscopy (N/A, 07/13/2018); Esophagogastroduodenoscopy (N/A, 09/14/2018); enteroscopy (N/A, 09/14/2018); Esophagogastroduodenoscopy (N/A, 06/21/2019); and TEE without cardioversion (N/A, 06/22/2019). Prior to Admission medications   Medication Sig Start Date End Date Taking? Authorizing Provider  albuterol (VENTOLIN HFA) 108 (90 Base) MCG/ACT inhaler Inhale 2 puffs into the lungs 4 (four) times daily as needed. Patient taking differently: Inhale 2 puffs into the lungs 4 (four) times daily as needed for wheezing or shortness of breath.  08/02/18  Yes Croitoru, Mihai, MD  [employer] ACETAMINOPHEN EX ST 500 MG tablet Take 500-1,000 mg by mouth every 6 (six) hours as needed for pain or fever. 04/26/19   Yes [provider]  ferrous sulfate 325 (65 FE) MG tablet Take 1 tablet (325 mg total) by mouth daily for 30 days. 05/28/18  Yes Elodia Florence., MD  losartan (COZAAR) 25 MG tablet Take 0.5 tablets (12.5 mg total)  by mouth daily. 06/26/19  Yes Danford, Suann Larry, MD  metolazone (ZAROXOLYN) 5 MG tablet Take 5 mg by mouth daily as needed. TAKE FOR WEIGHT GAIN >3lbs IN A DAY OR >5Llbs IN A WEEK   Yes [provider]  Multiple Vitamin (MULTIVITAMIN WITH MINERALS) TABS tablet Take 1 tablet by mouth daily. 05/29/19  Yes Nicole Kindred A, DO  pantoprazole (PROTONIX) 40 MG tablet Take 1 tablet (40 mg total) by mouth 2 (two) times daily before a meal. 06/25/19  Yes Danford, Suann Larry, MD  potassium chloride SA (KLOR-CON) 20 MEQ tablet Take 1 tablet (20 mEq total) by mouth 2 (two) times daily. 06/25/19  Yes Danford, Suann Larry, MD  simvastatin (ZOCOR) 20 MG tablet TAKE 1 TABLET BY MOUTH EVERY DAY Patient taking differently: Take 20 mg by mouth daily.  04/07/19  Yes Kilroy, Doreene Burke, PA-C  spironolactone (ALDACTONE) 25 MG tablet Take 1 tablet (25 mg total) by mouth daily. 08/01/19 10/30/19 Yes Hackney, Otila Kluver A, FNP  torsemide (DEMADEX) 20 MG tablet Take 2 tablets (40 mg total) by mouth 2 (two) times daily. Patient taking differently: Take 80 mg by mouth daily.  06/25/19  Yes Danford, Suann Larry, MD  triamcinolone cream (KENALOG) 0.1 % Apply 1 application topically 2 (two) times daily as needed for itching. (avoid face, groin and axilla) 04/17/19  Yes [provider]   Allergies  Allergen Reactions  . Entresto [Sacubitril-Valsartan] Swelling    And bruising of arm  . Phenazopyridine Nausea Only and Other (See Comments)    GI UPSET  . Ramipril Other (See Comments)    unk Other reaction(s): Other (See Comments), Unknown unk    FAMILY HISTORY:  family history includes Hypertension in his father. SOCIAL HISTORY:  reports that he quit smoking about 34 years ago. He has  never used smokeless tobacco. He reports that he does not drink alcohol or use drugs.  REVIEW OF SYSTEMS: Positives in BOLD  Constitutional: fever, chills, weight loss, malaise/fatigue and diaphoresis.  HENT: Negative for hearing loss, ear pain, nosebleeds, congestion, sore throat, neck pain, tinnitus and ear discharge.   Eyes: Negative for blurred vision, double vision, photophobia, pain, discharge and redness.  Respiratory: Negative for cough, hemoptysis, sputum production, shortness of breath, wheezing and stridor.   Cardiovascular: Negative for chest pain, palpitations, orthopnea, claudication, leg swelling and PND.  Gastrointestinal: Negative for heartburn, nausea, vomiting, abdominal pain, diarrhea, constipation, blood in stool and melena.  Genitourinary: Negative for dysuria, urgency, frequency, hematuria and flank pain.  Musculoskeletal: Negative for myalgias, back pain, joint pain and falls.  Skin: Negative for itching and rash.  Neurological: dizziness, tingling, tremors, sensory change, speech change, focal weakness, seizures, loss of consciousness, weakness and headaches.  Endo/Heme/Allergies: Negative for environmental allergies and polydipsia. Does not bruise/bleed easily.  SUBJECTIVE:  No complaints at this time   VITAL SIGNS: Temp:  [97.9 F (36.6 C)-103.1 F (39.5 C)] 98.4 F (36.9 C) (06/04 2317) Pulse Rate:  [59-67] 59 (06/04 2338) Resp:  [15-29] 17 (06/04 2338) BP: (74-105)/(44-64) 85/51 (06/04 2338) SpO2:  [87 %-100 %] 100 % (06/04 2338) Weight:  [89.5 kg] 89.5 kg (06/04 1346)  PHYSICAL EXAMINATION: General: chronically ill appearing male resting in bed, NAD Neuro: alert, follows commands, PERRLA HEENT: supple, no JVD  Cardiovascular:paced with BBB, no R/G Lungs: faint rhonchi throughout, even, non labored Abdomen: +BS x4, soft, non tender, non distended  Musculoskeletal: moves all extremities, 3+ bilateral lower extremity pitting edema  Skin: no rashes or  lesions  present   Recent Labs  Lab 08/18/19 1353  NA 139  K 3.5  CL 99  CO2 28  BUN 57*  CREATININE 2.34*  GLUCOSE 115*   Recent Labs  Lab 08/18/19 1353 08/18/19 2029  HGB 7.4* 7.4*  HCT 24.4* 23.9*  WBC 10.9* 11.1*  PLT 181 160   DG Chest Port 1 View  Result Date: 08/18/2019 CLINICAL DATA:  Fever and weakness EXAM: PORTABLE CHEST 1 VIEW COMPARISON:  July 14, 2019 FINDINGS: There is cardiomegaly with pulmonary vascularity normal. Pacemaker leads are attached to right atrium, right ventricle, and coronary sinus. There is consolidation in the left lower lobe with small left pleural effusion. There is a small right pleural effusion with slight right base atelectasis. Patient is status post internal mammary bypass grafting. There is aortic atherosclerosis. No adenopathy. There is arthropathy in each shoulder. IMPRESSION: Cardiomegaly. Airspace opacity consistent with pneumonia left base. Small pleural effusions bilaterally. Postoperative changes noted. Aortic Atherosclerosis (ICD10-I70.0). Electronically Signed   By: Lowella Grip III M.D.   On: 08/18/2019 14:20    ASSESSMENT / PLAN:  Group B streptoccocus bacteremia and possible pneumonia  Suspect chronic aspiration  Trend WBC and monitor fever curve  Trend PCT  Strep pneumoniae urinary antigen and legionella results pending  Reviewed previous infectious disease consultation note with abx recommendation during prior hospitalization with group B streptococcus bacteremia, therefore will start ceftriaxone 2g q24hrs  Speech therapy consulted appreciate input   Hypotension secondary to sepsis  HFrEF-EF 35%-40% Hx: CAD s/p CABG, paroxysmal atrial fibrillation, abdominal aortic aneurysm, AICD, pacemaker  Continuous telemetry monitoring  Gentle fluid resuscitation and prn levophed gtt to maintain map >65 Hold outpatient antihypertensives Continue outpatient simvastatin   Acute on chronic renal failure  Lactic acidosis-improving   Trend BMP  Replace electrolytes as indicated  Monitor UOP Avoid nephrotoxic medications   Iron deficiency anemia (no s/sx of acute blood loss) VTE px: SCD's, avoid chemical prophylaxis for now  Trend CBC  Monitor for s/sx of bleeding and transfuse for hgb <7   -Pt is deaf and will need sign language interpreter, however I was able to communicate with pt utilizing whiteboard.  When I asked if he understood plan of care he shook his head yes.  Also, I asked Mr. Rowlette if he had any questions and he shook his head no.  Marda Stalker, [place] Pager [phone] (please enter 7 digits) PCCM Consult Pager [phone] (please enter 7 digits)

## 2019-08-18 NOTE — ED Notes (Signed)
Patient's ASL interpreter had to leave at this time.  The phone number for [employer] is [phone].

## 2019-08-19 ENCOUNTER — Other Ambulatory Visit: Payer: Self-pay

## 2019-08-19 LAB — GASTROINTESTINAL PANEL BY PCR, STOOL (REPLACES STOOL CULTURE)

## 2019-08-19 LAB — BLOOD CULTURE ID PANEL (REFLEXED)

## 2019-08-19 LAB — CBC
HCT: 22.1 % — ABNORMAL LOW (ref 39.0–52.0)
HCT: 22.2 % — ABNORMAL LOW (ref 39.0–52.0)
HCT: 23.5 % — ABNORMAL LOW (ref 39.0–52.0)
HCT: 23.9 % — ABNORMAL LOW (ref 39.0–52.0)
HCT: 26.9 % — ABNORMAL LOW (ref 39.0–52.0)
Hemoglobin: 6.7 g/dL — ABNORMAL LOW (ref 13.0–17.0)
Hemoglobin: 6.9 g/dL — ABNORMAL LOW (ref 13.0–17.0)
Hemoglobin: 7.2 g/dL — ABNORMAL LOW (ref 13.0–17.0)
Hemoglobin: 7.3 g/dL — ABNORMAL LOW (ref 13.0–17.0)
Hemoglobin: 8.5 g/dL — ABNORMAL LOW (ref 13.0–17.0)
MCH: 23.8 pg — ABNORMAL LOW (ref 26.0–34.0)
MCH: 23.9 pg — ABNORMAL LOW (ref 26.0–34.0)
MCH: 23.9 pg — ABNORMAL LOW (ref 26.0–34.0)
MCH: 24 pg — ABNORMAL LOW (ref 26.0–34.0)
MCH: 24.8 pg — ABNORMAL LOW (ref 26.0–34.0)
MCHC: 30.1 g/dL (ref 30.0–36.0)
MCHC: 30.2 g/dL (ref 30.0–36.0)
MCHC: 31.1 g/dL (ref 30.0–36.0)
MCHC: 31.2 g/dL (ref 30.0–36.0)
MCHC: 31.6 g/dL (ref 30.0–36.0)
MCV: 76.5 fL — ABNORMAL LOW (ref 80.0–100.0)
MCV: 76.5 fL — ABNORMAL LOW (ref 80.0–100.0)
MCV: 78.4 fL — ABNORMAL LOW (ref 80.0–100.0)
MCV: 79.3 fL — ABNORMAL LOW (ref 80.0–100.0)
MCV: 79.7 fL — ABNORMAL LOW (ref 80.0–100.0)
Platelets: 136 10*3/uL — ABNORMAL LOW (ref 150–400)
Platelets: 142 10*3/uL — ABNORMAL LOW (ref 150–400)
Platelets: 144 10*3/uL — ABNORMAL LOW (ref 150–400)
Platelets: 160 10*3/uL (ref 150–400)
Platelets: 170 10*3/uL (ref 150–400)
RBC: 2.8 MIL/uL — ABNORMAL LOW (ref 4.22–5.81)
RBC: 2.89 MIL/uL — ABNORMAL LOW (ref 4.22–5.81)
RBC: 3 MIL/uL — ABNORMAL LOW (ref 4.22–5.81)
RBC: 3.07 MIL/uL — ABNORMAL LOW (ref 4.22–5.81)
RBC: 3.43 MIL/uL — ABNORMAL LOW (ref 4.22–5.81)
RDW: 18.2 % — ABNORMAL HIGH (ref 11.5–15.5)
RDW: 18.8 % — ABNORMAL HIGH (ref 11.5–15.5)
RDW: 19 % — ABNORMAL HIGH (ref 11.5–15.5)
RDW: 19.2 % — ABNORMAL HIGH (ref 11.5–15.5)
RDW: 19.3 % — ABNORMAL HIGH (ref 11.5–15.5)
WBC: 12.1 10*3/uL — ABNORMAL HIGH (ref 4.0–10.5)
WBC: 13.3 10*3/uL — ABNORMAL HIGH (ref 4.0–10.5)
WBC: 14.4 10*3/uL — ABNORMAL HIGH (ref 4.0–10.5)
WBC: 15.3 10*3/uL — ABNORMAL HIGH (ref 4.0–10.5)
WBC: 9.4 10*3/uL (ref 4.0–10.5)
nRBC: 0 % (ref 0.0–0.2)
nRBC: 0 % (ref 0.0–0.2)
nRBC: 0 % (ref 0.0–0.2)
nRBC: 0.2 % (ref 0.0–0.2)
nRBC: 0.3 % — ABNORMAL HIGH (ref 0.0–0.2)

## 2019-08-19 LAB — BASIC METABOLIC PANEL
Anion gap: 9 (ref 5–15)
BUN: 67 mg/dL — ABNORMAL HIGH (ref 8–23)
CO2: 27 mmol/L (ref 22–32)
Calcium: 7.4 mg/dL — ABNORMAL LOW (ref 8.9–10.3)
Chloride: 103 mmol/L (ref 98–111)
Creatinine, Ser: 2.57 mg/dL — ABNORMAL HIGH (ref 0.61–1.24)
GFR calc Af Amer: 26 mL/min — ABNORMAL LOW (ref >60)
GFR calc non Af Amer: 22 mL/min — ABNORMAL LOW (ref >60)
Glucose, Bld: 139 mg/dL — ABNORMAL HIGH (ref 70–99)
Potassium: 3.5 mmol/L (ref 3.5–5.1)
Sodium: 139 mmol/L (ref 135–145)

## 2019-08-19 LAB — LACTIC ACID, PLASMA
Lactic Acid, Venous: 1.4 mmol/L (ref 0.5–1.9)
Lactic Acid, Venous: 2.2 mmol/L (ref 0.5–1.9)

## 2019-08-19 LAB — PREPARE RBC (CROSSMATCH)

## 2019-08-19 LAB — MRSA PCR SCREENING: MRSA by PCR: NEGATIVE

## 2019-08-19 LAB — EXPECTORATED SPUTUM ASSESSMENT W GRAM STAIN, RFLX TO RESP C

## 2019-08-19 LAB — OCCULT BLOOD X 1 CARD TO LAB, STOOL: Fecal Occult Bld: POSITIVE — AB

## 2019-08-19 LAB — MAGNESIUM: Magnesium: 2 mg/dL (ref 1.7–2.4)

## 2019-08-19 LAB — HIV ANTIBODY (ROUTINE TESTING W REFLEX): HIV Screen 4th Generation wRfx: NONREACTIVE

## 2019-08-19 MED ORDER — ADULT MULTIVITAMIN W/MINERALS CH
1.0000 | ORAL_TABLET | Freq: Every day | ORAL | Status: DC
  Administered 2019-08-19 – 2019-08-24 (×5): 1 via ORAL
  Filled 2019-08-19 (×6): qty 1

## 2019-08-19 MED ORDER — SODIUM CHLORIDE 0.9 % IV SOLN
2.0000 g | INTRAVENOUS | Status: DC
  Administered 2019-08-19 – 2019-08-24 (×6): 2 g via INTRAVENOUS
  Filled 2019-08-19 (×2): qty 20
  Filled 2019-08-19 (×2): qty 2
  Filled 2019-08-19: qty 20
  Filled 2019-08-19 (×3): qty 2

## 2019-08-19 MED ORDER — CHLORHEXIDINE GLUCONATE CLOTH 2 % EX PADS
6.0000 | MEDICATED_PAD | Freq: Every day | CUTANEOUS | Status: DC
  Administered 2019-08-20 – 2019-08-21 (×2): 6 via TOPICAL

## 2019-08-19 MED ORDER — PENICILLIN G POT IN DEXTROSE 60000 UNIT/ML IV SOLN
3.0000 10*6.[IU] | INTRAVENOUS | Status: DC
  Filled 2019-08-19 (×4): qty 50

## 2019-08-19 MED ORDER — SODIUM CHLORIDE 0.9% IV SOLUTION: Freq: Once | INTRAVENOUS | Status: AC

## 2019-08-19 MED ORDER — DIPHENHYDRAMINE HCL 50 MG/ML IJ SOLN
25.0000 mg | Freq: Once | INTRAMUSCULAR | Status: AC
  Administered 2019-08-19: 25 mg via INTRAVENOUS
  Filled 2019-08-19: qty 1

## 2019-08-19 MED ORDER — FERROUS SULFATE 325 (65 FE) MG PO TABS
325.0000 mg | ORAL_TABLET | Freq: Every day | ORAL | Status: DC
  Administered 2019-08-19 – 2019-08-24 (×6): 325 mg via ORAL
  Filled 2019-08-19 (×8): qty 1

## 2019-08-19 MED ORDER — PENICILLIN G POTASSIUM 20000000 UNITS IJ SOLR
3.0000 10*6.[IU] | INTRAVENOUS | Status: DC
  Filled 2019-08-19 (×4): qty 3

## 2019-08-19 MED ORDER — SIMVASTATIN 20 MG PO TABS
20.0000 mg | ORAL_TABLET | Freq: Every day | ORAL | Status: DC
  Administered 2019-08-19 – 2019-08-23 (×4): 20 mg via ORAL
  Filled 2019-08-19 (×6): qty 1

## 2019-08-19 MED ORDER — STERILE WATER FOR INJECTION IJ SOLN
INTRAMUSCULAR | Status: AC
  Administered 2019-08-19: 10 mL
  Filled 2019-08-19: qty 10

## 2019-08-19 NOTE — Progress Notes (Addendum)
PHARMACY - PHYSICIAN COMMUNICATION CRITICAL VALUE ALERT - BLOOD CULTURE IDENTIFICATION (BCID)  Thomas [name] is an 85 y.o. male who presented to [facility] on 08/18/2019 with a chief complaint of fever, chills, cough, SOB, black stool  Assessment:  Tmax 203.5D, systolic 80 - 97'C, 163% O2 2L [place], [place] 3.6 >> 2.3 >> 2.2, PCT 52.90 (patient is in AKI), WBC 10.9 >> 11.1 >> 13.3, 4/4 GPC BCID Strep species group B strep, CXR IMPRESSION: Cardiomegaly. Airspace opacity consistent with pneumonia left base. Small pleural effusions bilaterally. Postoperative changes noted.  Patient also has a PICC line that has been in for 56 days, and a decubitus ulcer stage II for 58 days which could be possible sources of infection.  Name of physician (or Provider) Contacted: Marda Stalker  Current antibiotics: Azithro/vanc/cefepime  Changes to prescribed antibiotics recommended:  Recommendation not accepted by provider -- Will switch abx to ceftriaxone 2g IV q24h based on patient's h/o of bacteremia. Will continue to monitor.  Results for orders placed or performed during the hospital encounter of 08/18/19  Blood Culture ID Panel (Reflexed) (Collected: 08/18/2019  2:34 PM)  Result Value Ref Range   Enterococcus species NOT DETECTED NOT DETECTED   Listeria monocytogenes NOT DETECTED NOT DETECTED   Staphylococcus species NOT DETECTED NOT DETECTED   Staphylococcus aureus (BCID) NOT DETECTED NOT DETECTED   Streptococcus species DETECTED (A) NOT DETECTED   Streptococcus agalactiae DETECTED (A) NOT DETECTED   Streptococcus pneumoniae NOT DETECTED NOT DETECTED   Streptococcus pyogenes NOT DETECTED NOT DETECTED   Acinetobacter baumannii NOT DETECTED NOT DETECTED   Enterobacteriaceae species NOT DETECTED NOT DETECTED   Enterobacter cloacae complex NOT DETECTED NOT DETECTED   Escherichia coli NOT DETECTED NOT DETECTED   Klebsiella oxytoca NOT DETECTED NOT DETECTED   Klebsiella pneumoniae NOT DETECTED NOT DETECTED    Proteus species NOT DETECTED NOT DETECTED   Serratia marcescens NOT DETECTED NOT DETECTED   Haemophilus influenzae NOT DETECTED NOT DETECTED   Neisseria meningitidis NOT DETECTED NOT DETECTED   Pseudomonas aeruginosa NOT DETECTED NOT DETECTED   Candida albicans NOT DETECTED NOT DETECTED   Candida glabrata NOT DETECTED NOT DETECTED   Candida krusei NOT DETECTED NOT DETECTED   Candida parapsilosis NOT DETECTED NOT DETECTED   Candida tropicalis NOT DETECTED NOT DETECTED   Tobie Lords, PharmD, BCPS Clinical Pharmacist 08/19/2019  1:29 AM

## 2019-08-19 NOTE — Evaluation (Signed)
Clinical/Bedside Swallow Evaluation Patient Details  Name: Thomas [name] MRN: [id number] Date of Birth: 1935-02-11  Today's Date: 08/19/2019 Time: SLP Start Time (ACUTE ONLY): 22 SLP Stop Time (ACUTE ONLY): 1500 SLP Time Calculation (min) (ACUTE ONLY): 50 min  Past Medical History:  Past Medical History:  Diagnosis Date  . AICD (automatic cardioverter/defibrillator) present   . Biventricular ICD (implantable cardioverter-defibrillator) in place 03/24/2005   Implantation of a [employer] Adapta ADDRO1, serial number [id number]  . CHF (congestive heart failure) ([place])   . CKD (chronic kidney disease), stage III   . Coronary artery disease    a. s/p CABG 1986. b. Multiple PCIs/caths. c. 09/2013: s/p PTCA and BMS to SVG-OM.  [name]   . Dysrhythmia   . History of abdominal aortic aneurysm   . History of bleeding peptic ulcer 1980  . History of epididymitis 2013  . HTN (hypertension)   . Hydronephrosis with ureteropelvic junction obstruction   . Hydroureter on left 2009  . Hypertension   . Ischemic cardiomyopathy    a. Prior EF 30-35%, s/p BIV-ICD. b. 09/2013: EF 45-50%.  . Moderate tricuspid regurgitation   . PAF (paroxysmal atrial fibrillation) ([place])   . Presence of permanent cardiac pacemaker   . Prostate cancer ([place])   . Status post coronary artery bypass grafting 1986   LIMA to the LAD, SVG to OM, SVG to RCA  . Testicular swelling    Past Surgical History:  Past Surgical History:  Procedure Laterality Date  . 2-D echocardiogram  11/20/2011   Ejection fraction 30-35% moderate concentric left ventricular hypertrophy. Left atrium is moderately dilated. Mild MR. Mild or  . BI-VENTRICULAR IMPLANTABLE CARDIOVERTER DEFIBRILLATOR N/A 12/16/2012   Procedure: BI-VENTRICULAR IMPLANTABLE CARDIOVERTER DEFIBRILLATOR  (CRT-D);  Surgeon: [name], MD;  Location: [facility] CATH LAB;  Service: Cardiovascular;  Laterality: N/A;  . CARDIAC CATHETERIZATION  12/10/2011   SVG to OM widely patent.   LIMA to LAD patent  . CATARACT EXTRACTION W/PHACO Right 10/12/2017   Procedure: CATARACT EXTRACTION PHACO AND INTRAOCULAR LENS PLACEMENT (IOC);  Surgeon: Birder Robson, MD;  Location: [facility] ORS;  Service: Ophthalmology;  Laterality: Right;  [place] 00:57 AP% 15.9 CDE 9.07 Fluid pack lot # [id number]  . COLONOSCOPY N/A 07/13/2018   Procedure: COLONOSCOPY;  Surgeon: Toledo, Benay Pike, MD;  Location: [facility] ENDOSCOPY;  Service: Gastroenterology;  Laterality: N/A;  . CORONARY ARTERY BYPASS GRAFT  1986  . ENTEROSCOPY N/A 09/14/2018   Procedure: ENTEROSCOPY;  Surgeon: Toledo, Benay Pike, MD;  Location: [facility] ENDOSCOPY;  Service: Gastroenterology;  Laterality: N/A;  symptomatic anemia, GI blood loss anemia, melena, positive small bowel capsule endoscopy showing source of bleeding   . ESOPHAGOGASTRODUODENOSCOPY N/A 07/13/2018   Procedure: ESOPHAGOGASTRODUODENOSCOPY (EGD);  Surgeon: Toledo, Benay Pike, MD;  Location: [facility] ENDOSCOPY;  Service: Gastroenterology;  Laterality: N/A;  . ESOPHAGOGASTRODUODENOSCOPY N/A 09/14/2018   Procedure: ESOPHAGOGASTRODUODENOSCOPY (EGD);  Surgeon: Toledo, Benay Pike, MD;  Location: [facility] ENDOSCOPY;  Service: Gastroenterology;  Laterality: N/A;  SIGN LANAGUAGE INTERPRETER  . ESOPHAGOGASTRODUODENOSCOPY N/A 06/21/2019   Procedure: ESOPHAGOGASTRODUODENOSCOPY (EGD);  Surgeon: Toledo, Benay Pike, MD;  Location: [facility] ENDOSCOPY;  Service: Gastroenterology;  Laterality: N/A;  . ESOPHAGOGASTRODUODENOSCOPY (EGD) WITH PROPOFOL N/A 05/27/2018   Procedure: ESOPHAGOGASTRODUODENOSCOPY (EGD) WITH PROPOFOL;  Surgeon: Clarene Essex, MD;  Location: [place];  Service: Endoscopy;  Laterality: N/A;  . INSERT / REPLACE / REMOVE PACEMAKER    . LEFT HEART CATHETERIZATION WITH CORONARY/GRAFT ANGIOGRAM N/A 12/10/2011   Procedure: LEFT HEART CATHETERIZATION WITH CORONARY/GRAFT ANGIOGRAM;  Surgeon: Sanda Klein,  MD;  Location: [place] CATH LAB;  Service: Cardiovascular;  Laterality: N/A;  . LEFT HEART CATHETERIZATION WITH  CORONARY/GRAFT ANGIOGRAM N/A 09/25/2013   Procedure: LEFT HEART CATHETERIZATION WITH Beatrix Fetters;  Surgeon: Blane Ohara, MD;  Location: [facility] CATH LAB;  Service: Cardiovascular;  Laterality: N/A;  . Persantine Myoview  05/06/2010   Post-rest ejection fraction 30%. No significant ischemia demonstrated. Compared to previous study there is no significant change.  . TEE WITHOUT CARDIOVERSION N/A 06/22/2019   Procedure: TRANSESOPHAGEAL ECHOCARDIOGRAM (TEE);  Surgeon: Minna Merritts, MD;  Location: [facility] ORS;  Service: Cardiovascular;  Laterality: N/A;  . TRANSURETHRAL RESECTION OF PROSTATE     s/p   HPI:  Pt is 85 y/o male with a PMH of CABG (1986), Prostate Cancer, GERD, Pacemaker, Paroxysmal Atrial Fibrillation, Moderate Tricuspid Regurgitation, Ischemic Cardiomyopathy, HTN, Hydronephrosis with Ureteropelvic Junction Obstruction, Epididymitis, Bleeding Peptic Ulcer, Abdominal Aortic Aneurysm, Deafness, CAD, Stage III CKD, CHF, and AICD.  He presented to [facility] ER on 06/4 with weakness (onset 06/3) and fever (temp 101.3 the morning of 06/4).  Pt also endorsed some shortness of breath and a mild cough. Pt to ICU d/t hypotension requiring Levo gtt. Pt adm d/t HCAP.  Noted Upper Endoscopy in 06/2019 revealing Gastric mucosal atrophy. CXR 08/18/2019: Cardiomegaly. Airspace opacity consistent with pneumonia left base; small effusions.   Assessment / Plan / Recommendation Clinical Impression  Pt seen during this bedside swallow evaluation using Interpreter services(ASL); Aaron Edelman. Pt appears to present w/ adequate oropharyngeal phase swallow w/ No oropharyngeal phase dysphagia noted, No neuromuscular deficits noted. Pt consumed po trials w/ No overt, clinical s/s of aspiration during po trials. Pt appears at reduced risk for aspiration following general aspiration precautions. However, pt does endorse Baseline Esophageal Dysmotility and discomfort w/ Regurgitation occurrences. This can happen w/ Pills or  foods he stated. Prior to admission, pt was able to drink milk and liquids, eat some soft foods but not often - stated this Dysmotility had been ongoing for ~1 month. NSG noted a wet vocal quality this AM w/ oral intake. Any Esophageal dysmotility can impact the pharyngeal phase of swallowing as well as impact overall desire for oral intake. During po trials at BSE taken slowly, pt consumed all consistencies w/ no overt coughing, decline in vocal quality, or change in respiratory presentation during/post trials. O2 sats remained 98%, RR/HR at baseline. Oral phase appeared [facility] w/ timely bolus management, mastication, and control of bolus propulsion for A-P transfer for swallowing. Oral clearing achieved w/ all trial consistencies. OM Exam appeared [facility] w/ no unilateral weakness noted. Pt fed self w/ setup support. Recommend a more Mech Soft consistency diet w/ well-Cut or Minced meats, moistened foods; Thin liquids. Recommend general aspiration and Reflux precautions, Pills WHOLE in Puree for safer, easier swallowing in light of Esophageal dysmotility complaints. Education given on Pills in Puree; food consistencies and easy to eat options; general aspiration and Reflux precautions. NSG to reconsult if any new needs arise. MD/NSG/pt agreed.  SLP Visit Diagnosis: Dysphagia, unspecified (R13.10)(Esophageal phase dysmotility endorsed)    Aspiration Risk  Risk for inadequate nutrition/hydration(reduced risk for aspiration following precs.)    Diet Recommendation  Mech Soft diet w/ gravies added to moisten; Thin liquids. General aspiration and REFLUX precautions.   Medication Administration: Whole meds with puree(for easier swallowing and clearing)    Other  Recommendations Recommended Consults: Consider GI evaluation;Consider esophageal assessment(for motility d/t c/o Regurgitation; Dietician f/u) Oral Care Recommendations: Oral care BID;Oral care before and after PO;Patient independent  with oral care Other  Recommendations: (n/a)   Follow up Recommendations None      Frequency and Duration (n/a)  (n/a)       Prognosis Prognosis for Safe Diet Advancement: Good Barriers to Reach Goals: Time post onset;Severity of deficits(GI Dysmotility complaints)      Swallow Study   General Date of Onset: 08/18/19 HPI: Pt is 85 y/o male with a PMH of CABG (1986), Prostate Cancer, GERD, Pacemaker, Paroxysmal Atrial Fibrillation, Moderate Tricuspid Regurgitation, Ischemic Cardiomyopathy, HTN, Hydronephrosis with Ureteropelvic Junction Obstruction, Epididymitis, Bleeding Peptic Ulcer, Abdominal Aortic Aneurysm, Deafness, CAD, Stage III CKD, CHF, and AICD.  He presented to [facility] ER on 06/4 with weakness (onset 06/3) and fever (temp 101.3 the morning of 06/4).  Pt also endorsed some shortness of breath and a mild cough. Pt to ICU d/t hypotension requiring Levo gtt. Pt adm d/t HCAP.  Noted Upper Endoscopy in 06/2019 revealing Gastric mucosal atrophy. CXR 08/18/2019: Cardiomegaly. Airspace opacity consistent with pneumonia left base; small effusions. Type of Study: Bedside Swallow Evaluation Previous Swallow Assessment: none Diet Prior to this Study: NPO(regular at home) Temperature Spikes Noted: No(wbc 12.1 declining - receiving blood transfusion) Respiratory Status: Nasal cannula(2L) History of Recent Intubation: No Behavior/Cognition: Alert;Cooperative;Pleasant mood(Deaf - used interpreter for communication) Oral Cavity Assessment: Within Functional Limits Oral Care Completed by SLP: Yes Oral Cavity - Dentition: Adequate natural dentition;Missing dentition Vision: Functional for self-feeding Self-Feeding Abilities: Able to feed self;Needs set up Patient Positioning: Upright in bed(needed min support upright) Baseline Vocal Quality: Normal(during phonation x3) Volitional Swallow: Able to elicit    Oral/Motor/Sensory Function Overall Oral Motor/Sensory Function: Within functional limits   Ice Chips Ice chips:  Within functional limits Presentation: Spoon(fed; 3 trials - masticated well)   Thin Liquid Thin Liquid: Within functional limits Presentation: Cup;Self Fed;Straw(~4-5+ ozs)    Nectar Thick Nectar Thick Liquid: Not tested   Honey Thick Honey Thick Liquid: Not tested   Puree Puree: Within functional limits Presentation: Self Fed;Spoon(4 trials) Other Comments: large boluses   Solid     Solid: Within functional limits Presentation: Self Fed;Spoon(4 trials)        Orinda Kenner, MS, CCC-SLP Camera Krienke 08/19/2019,3:13 PM

## 2019-08-19 NOTE — Evaluation (Signed)
Occupational Therapy Evaluation Patient Details Name: Thomas [name] MRN: [id number] DOB: 13-May-1934 Today's Date: 08/19/2019    History of Present Illness Pt is 85 y/o male with a PMH of CABG (1986), Prostate Cancer, Pacemaker, Paroxysmal Atrial Fibrillation, Moderate Tricuspid Regurgitation, Ischemic Cardiomyopathy, HTN, Hydronephrosis with Ureteropelvic Junction Obstruction, Epididymitis, Bleeding Peptic Ulcer, Abdominal Aortic Aneurysm, Deafness, CAD, Stage III CKD, CHF, and AICD.  He presented to [facility] ER on 06/4 with weakness (onset 06/3) and fever (temp 101.3 the morning of 06/4).  Pt also endorsed some shortness of breath and a mild cough. Pt to ICU d/t hypotension requiring Levo gtt. Pt adm d/t HCAP.   Clinical Impression   Pt seen for OT evaluation this date in setting of acute hospitalization for HCAP requiring ICU d/t hypotension. Pt is generally deconditioned compared to his reported PLOF. Pt states that he lives with spouse in one level home with level entry and used to use [facility] for fxl mobility, but uses 2WW more now d/t several falls. Pt with gross weakness of UEs and trunk that limit his ability to fxl'ly participate in self care ADLs/ADL mobility. Pt requires MIN A for lateral rolling in bed, MOD/MAX A for bed level toileting/peri care, and is not safe to further mobilize at this time d/t soft BP 87/53 with a MAP of 64. Will continue to follow pt with OT to increase strength and mobility as it pertains to safety with self care ADLs/ADL mobility. In addition, anticipate pt will require STR following hospital stay to restore function to highest attainable level.    Follow Up Recommendations  SNF    Equipment Recommendations  Other (comment)(defer to next venue of care)    Recommendations for Other Services       Precautions / Restrictions Precautions Precautions: Fall Restrictions Weight Bearing Restrictions: No      Mobility Bed Mobility Overal bed mobility: Needs  Assistance Bed Mobility: Rolling Rolling: Min assist            Transfers                 General transfer comment: NT, BP soft at 87/53 with a MAP of 64    Balance                                           ADL either performed or assessed with clinical judgement   ADL Overall ADL's : Needs assistance/impaired Eating/Feeding: Supervision/ safety;Bed level Eating/Feeding Details (indicate cue type and reason): HOB elevated, BP too soft to try to sit EOB at this time (RN reports having tried to take pt off Levo gtt, but might have to re-start) Grooming: Wash/dry hands;Wash/dry face;Minimal assistance;Bed level           Upper Body Dressing : Minimal assistance;Bed level Upper Body Dressing Details (indicate cue type and reason): based on clinical obs Lower Body Dressing: Maximal assistance;Bed level Lower Body Dressing Details (indicate cue type and reason): based on clinical obs   Toilet Transfer Details (indicate cue type and reason): NT, BP soft Toileting- Clothing Manipulation and Hygiene: Moderate assistance;Maximal assistance;Bed level Toileting - Clothing Manipulation Details (indicate cue type and reason): pt with copious amounts of loose stool. RN needing to place flexiseal. OT facilitates bed mobility to assist in this endeavor.             Vision Baseline Vision/History: Wears glasses Patient  Visual Report: No change from baseline       Perception     Praxis      Pertinent Vitals/Pain Pain Assessment: Faces Faces Pain Scale: Hurts a little bit Pain Location: slight grimace with rolling Pain Descriptors / Indicators: Grimacing Pain Intervention(s): Limited activity within patient's tolerance;Monitored during session     Hand Dominance Right   Extremity/Trunk Assessment Upper Extremity Assessment Upper Extremity Assessment: Overall WFL for tasks assessed;Generalized weakness(ROM WFL, MMT grossly 4-/5)   Lower Extremity  Assessment Lower Extremity Assessment: Defer to PT evaluation;Generalized weakness       Communication Communication Communication: Deaf(pt used whiteboard to communicate with this Chief Strategy Officer.)   Cognition Arousal/Alertness: Awake/alert Behavior During Therapy: WFL for tasks assessed/performed Overall Cognitive Status: Difficult to assess                                 General Comments: Pt mostly oriented and appropriate, some forgetfullness.   General Comments       Exercises Other Exercises Other Exercises: OT facilitates education re: role of OT. Pt with good reception detected. Appropriate responses/questions.   Shoulder Instructions      Home Living Family/patient expects to be discharged to:: Private residence Living Arrangements: Spouse/significant other Available Help at Discharge: Family;Available 24 hours/day Type of Home: House Home Access: Level entry     Home Layout: One level               Home Equipment: Walker - 2 wheels;Cane - single point   Additional Comments: Lives with wife and adult son       Prior Functioning/Environment Level of Independence: Independent with assistive device(s)        Comments: On last adm, reported amb w/ SPC household distances. Also endorsed multiple falls due to weakness and reports using 2WW of late. Home O2 as needed.        OT Problem List: Decreased strength;Decreased activity tolerance;Impaired balance (sitting and/or standing);Decreased knowledge of use of DME or AE;Cardiopulmonary status limiting activity      OT Treatment/Interventions: Self-care/ADL training;Therapeutic exercise;Energy conservation;DME and/or AE instruction;Therapeutic activities;Balance training;Patient/family education    OT Goals(Current goals can be found in the care plan section) Acute Rehab OT Goals Patient Stated Goal: to be able to just move more/do more OT Goal Formulation: With patient Time For Goal Achievement:  09/02/19 Potential to Achieve Goals: Good  OT Frequency: Min 2X/week   Barriers to D/C:            Co-evaluation              AM-PAC OT "6 Clicks" Daily Activity     Outcome Measure Help from another person eating meals?: A Little Help from another person taking care of personal grooming?: A Little Help from another person toileting, which includes using toliet, bedpan, or urinal?: A Lot Help from another person bathing (including washing, rinsing, drying)?: A Lot Help from another person to put on and taking off regular upper body clothing?: A Little Help from another person to put on and taking off regular lower body clothing?: A Lot 6 Click Score: 15   End of Session Nurse Communication: Mobility status  Activity Tolerance: Treatment limited secondary to medical complications (Comment) Patient left: in bed;with call bell/phone within reach;with bed alarm set;with nursing/sitter in room  OT Visit Diagnosis: Repeated falls (R29.6);Muscle weakness (generalized) (M62.81)  Time: 1325-1408 OT Time Calculation (min): 43 min Charges:  OT General Charges $OT Visit: 1 Visit OT Evaluation $OT Eval Moderate Complexity: 1 Mod OT Treatments $Self Care/Home Management : 8-22 mins $Therapeutic Activity: 8-22 mins  Gerrianne Scale, MS, OTR/L ascom [phone] 08/19/19, 2:35 PM

## 2019-08-19 NOTE — Progress Notes (Signed)
PT Cancellation Note  Patient Details Name: Thomas [name] MRN: [id number] DOB: February 11, 1935   Cancelled Treatment:    Reason Eval/Treat Not Completed: Other (comment). Consult received and chart reviewed. Pt very lethargic doesn't awaken to sternal rubbing or verbal cues. Will re-attempt next available date.   [name] 08/19/2019, 11:47 AM  [name], PT, DPT [phone]

## 2019-08-19 NOTE — Progress Notes (Signed)
Name: Thomas [name] MRN: [id number] DOB: 18-Dec-1934    ADMISSION DATE:  08/18/2019 CONSULTATION DATE: 08/18/2019  REFERRING MD : Sharion Settler, NP  CHIEF COMPLAINT: Hypotension   BRIEF PATIENT DESCRIPTION:  85 yo male admitted with septic shock secondary to pneumonia and group B strep bacteremia requiring levophed gtt   SIGNIFICANT EVENTS/STUDIES:  06/4: Pt initially admitted to the progressive care unit, but remained in the ER pending bed availability 06/5: Pt developed hypotension requiring levophed gtt, therefore pt changed to ICU status and PCCM contacted to assist with management   HISTORY OF PRESENT ILLNESS:  This is an 85 yo male with a PMH of CABG (1986), Prostate Cancer, Pacemaker, Paroxysmal Atrial Fibrillation, Moderate Tricuspid Regurgitation, Ischemic Cardiomyopathy, HTN, Hydronephrosis with Ureteropelvic Junction Obstruction, Epididymitis, Bleeding Peptic Ulcer, Abdominal Aortic Aneurysm, Deafness, CAD, Stage III CKD, CHF, and AICD.  He presented to [facility] ER on 06/4 with weakness (onset 06/3) and fever (temp 101.3 the morning of 06/4).  Pt also endorsed some shortness of breath and a mild cough. ER labs were: glucose 115, BUN 57, creatinine 2.34, albumin 1.9, BNP 588.8, lactic acid 2.3, pct 52.90, wbc 10.9, hgb 7.4, and  platelets 160.  COVID-19 negative, however CXR concerning for pneumonia.  Pt received vancomycin, cefepime, 500 ml NS bolus, and 1L LR bolus.  He was initially admitted to the progressive care unit per hospitalist team.  However, while awaiting in the ER for progressive care bed availability he became hypotensive with sbp 70-low 80's.  He received 25 g of albumin, but remained hypotensive requiring levophed gtt.  PCCM team consulted per hospitalist team to assist with management.    PAST MEDICAL HISTORY :   has a past medical history of AICD (automatic cardioverter/defibrillator) present, Biventricular ICD (implantable cardioverter-defibrillator) in place  (03/24/2005), CHF (congestive heart failure) ([place]), CKD (chronic kidney disease), stage III, Coronary artery disease, Deaf, Dysrhythmia, History of abdominal aortic aneurysm, History of bleeding peptic ulcer (1980), History of epididymitis (2013), HTN (hypertension), Hydronephrosis with ureteropelvic junction obstruction, Hydroureter on left (2009), Hypertension, Ischemic cardiomyopathy, Moderate tricuspid regurgitation, PAF (paroxysmal atrial fibrillation) ([place]), Presence of permanent cardiac pacemaker, Prostate cancer ([place]), Status post coronary artery bypass grafting (1986), and Testicular swelling.  has a past surgical history that includes Coronary artery bypass graft (1986); Insert / replace / remove pacemaker; Transurethral resection of prostate; Cardiac catheterization (12/10/2011); 2-D echocardiogram (11/20/2011); Persantine Myoview (05/06/2010); left heart catheterization with coronary/graft angiogram (N/A, 12/10/2011); bi-ventricular implantable cardioverter defibrillator (N/A, 12/16/2012); left heart catheterization with coronary/graft angiogram (N/A, 09/25/2013); Cataract extraction w/PHACO (Right, 10/12/2017); Esophagogastroduodenoscopy (egd) with propofol (N/A, 05/27/2018); Esophagogastroduodenoscopy (N/A, 07/13/2018); Colonoscopy (N/A, 07/13/2018); Esophagogastroduodenoscopy (N/A, 09/14/2018); enteroscopy (N/A, 09/14/2018); Esophagogastroduodenoscopy (N/A, 06/21/2019); and TEE without cardioversion (N/A, 06/22/2019). Prior to Admission medications   Medication Sig Start Date End Date Taking? Authorizing Provider  albuterol (VENTOLIN HFA) 108 (90 Base) MCG/ACT inhaler Inhale 2 puffs into the lungs 4 (four) times daily as needed. Patient taking differently: Inhale 2 puffs into the lungs 4 (four) times daily as needed for wheezing or shortness of breath.  08/02/18  Yes Croitoru, Mihai, MD  [employer] ACETAMINOPHEN EX ST 500 MG tablet Take 500-1,000 mg by mouth every 6 (six) hours as needed for pain or fever. 04/26/19   Yes [provider]  ferrous sulfate 325 (65 FE) MG tablet Take 1 tablet (325 mg total) by mouth daily for 30 days. 05/28/18  Yes Elodia Florence., MD  losartan (COZAAR) 25 MG tablet Take 0.5 tablets (12.5 mg total)  by mouth daily. 06/26/19  Yes Danford, Suann Larry, MD  metolazone (ZAROXOLYN) 5 MG tablet Take 5 mg by mouth daily as needed. TAKE FOR WEIGHT GAIN >3lbs IN A DAY OR >5Llbs IN A WEEK   Yes [provider]  Multiple Vitamin (MULTIVITAMIN WITH MINERALS) TABS tablet Take 1 tablet by mouth daily. 05/29/19  Yes Nicole Kindred A, DO  pantoprazole (PROTONIX) 40 MG tablet Take 1 tablet (40 mg total) by mouth 2 (two) times daily before a meal. 06/25/19  Yes Danford, Suann Larry, MD  potassium chloride SA (KLOR-CON) 20 MEQ tablet Take 1 tablet (20 mEq total) by mouth 2 (two) times daily. 06/25/19  Yes Danford, Suann Larry, MD  simvastatin (ZOCOR) 20 MG tablet TAKE 1 TABLET BY MOUTH EVERY DAY Patient taking differently: Take 20 mg by mouth daily.  04/07/19  Yes Kilroy, Doreene Burke, PA-C  spironolactone (ALDACTONE) 25 MG tablet Take 1 tablet (25 mg total) by mouth daily. 08/01/19 10/30/19 Yes Hackney, Otila Kluver A, FNP  torsemide (DEMADEX) 20 MG tablet Take 2 tablets (40 mg total) by mouth 2 (two) times daily. Patient taking differently: Take 80 mg by mouth daily.  06/25/19  Yes Danford, Suann Larry, MD  triamcinolone cream (KENALOG) 0.1 % Apply 1 application topically 2 (two) times daily as needed for itching. (avoid face, groin and axilla) 04/17/19  Yes [provider]   Allergies  Allergen Reactions  . Entresto [Sacubitril-Valsartan] Swelling    And bruising of arm  . Phenazopyridine Nausea Only and Other (See Comments)    GI UPSET  . Ramipril Other (See Comments)    unk Other reaction(s): Other (See Comments), Unknown unk    FAMILY HISTORY:  family history includes Hypertension in his father. SOCIAL HISTORY:  reports that he quit smoking about 34 years ago. He has  never used smokeless tobacco. He reports that he does not drink alcohol or use drugs.  REVIEW OF SYSTEMS: Positives in BOLD  Constitutional: fever, chills, weight loss, malaise/fatigue and diaphoresis.  HENT: Negative for hearing loss, ear pain, nosebleeds, congestion, sore throat, neck pain, tinnitus and ear discharge.   Eyes: Negative for blurred vision, double vision, photophobia, pain, discharge and redness.  Respiratory: Negative for cough, hemoptysis, sputum production, shortness of breath, wheezing and stridor.   Cardiovascular: Negative for chest pain, palpitations, orthopnea, claudication, leg swelling and PND.  Gastrointestinal: Negative for heartburn, nausea, vomiting, abdominal pain, diarrhea, constipation, blood in stool and melena.  Genitourinary: Negative for dysuria, urgency, frequency, hematuria and flank pain.  Musculoskeletal: Negative for myalgias, back pain, joint pain and falls.  Skin: Negative for itching and rash.  Neurological: dizziness, tingling, tremors, sensory change, speech change, focal weakness, seizures, loss of consciousness, weakness and headaches.  Endo/Heme/Allergies: Negative for environmental allergies and polydipsia. Does not bruise/bleed easily.  SUBJECTIVE:  No complaints at this time   VITAL SIGNS: Temp:  [97.5 F (36.4 C)-103.1 F (39.5 C)] 97.5 F (36.4 C) (06/05 0900) Pulse Rate:  [43-67] 52 (06/05 0900) Resp:  [13-29] 22 (06/05 0900) BP: (74-105)/(44-64) 101/61 (06/05 0900) SpO2:  [87 %-100 %] 99 % (06/05 0900) Weight:  [89.5 kg-92.2 kg] 92.2 kg (06/05 0224)  PHYSICAL EXAMINATION: General: chronically ill appearing male resting in bed, NAD Neuro: alert, follows commands, PERRLA HEENT: supple, no JVD  Cardiovascular:paced with BBB, no R/G Lungs: faint rhonchi throughout, even, non labored Abdomen: +BS x4, soft, non tender, non distended  Musculoskeletal: moves all extremities, 3+ bilateral lower extremity pitting edema  Skin: no  rashes or  lesions present   Recent Labs  Lab 08/18/19 1353 08/19/19 0227  NA 139 139  K 3.5 3.5  CL 99 103  CO2 28 27  BUN 57* 67*  CREATININE 2.34* 2.57*  GLUCOSE 115* 139*   Recent Labs  Lab 08/18/19 2351 08/19/19 0227 08/19/19 0650  HGB 6.9* 7.3* 7.2*  HCT 22.1* 23.5* 23.9*  WBC 13.3* 15.3* 14.4*  PLT 136* 160 170   DG Chest Port 1 View  Result Date: 08/18/2019 CLINICAL DATA:  Fever and weakness EXAM: PORTABLE CHEST 1 VIEW COMPARISON:  July 14, 2019 FINDINGS: There is cardiomegaly with pulmonary vascularity normal. Pacemaker leads are attached to right atrium, right ventricle, and coronary sinus. There is consolidation in the left lower lobe with small left pleural effusion. There is a small right pleural effusion with slight right base atelectasis. Patient is status post internal mammary bypass grafting. There is aortic atherosclerosis. No adenopathy. There is arthropathy in each shoulder. IMPRESSION: Cardiomegaly. Airspace opacity consistent with pneumonia left base. Small pleural effusions bilaterally. Postoperative changes noted. Aortic Atherosclerosis (ICD10-I70.0). Electronically Signed   By: Lowella Grip III M.D.   On: 08/18/2019 14:20       ASSESSMENT / PLAN:  Group B streptoccocus bacteremia and possible pneumonia  -patient with watery diarreah - as possible source- query diverticular/hemmoroidal lesion -Abnormal CXR in context of advanced HFrEF and cannot rule out infectious infiltrate.  -c/w Rocephin Trend PCT  Strep pneumoniae urinary antigen and legionella results pending    Acute on chronic systolic CHF HFrEF-EF 63%-84% Hx: CAD s/p CABG, paroxysmal atrial fibrillation, abdominal aortic aneurysm, AICD, pacemaker  Continuous telemetry monitoring   prn levophed gtt to maintain map >65 Hold outpatient antihypertensives Continue outpatient simvastatin    Bilateral interstitial and airspace opacification  - possible CHF vs CAP vs Asp PNA related  -  currently on empiric ABX  - patient is on vasopressor support   -IVF   Watery diarreah     - GI - PCR stool panel     - FOBT     - continue Rocephin  Acute on chronic renal failure -stage 3 Lactic acidosis-improving  Trend BMP  Replace electrolytes as indicated  Monitor UOP Avoid nephrotoxic medications  IVF  Iron deficiency anemia (no s/sx of acute blood loss) VTE px: SCD's, avoid chemical prophylaxis for now  Trend CBC  Monitor for s/sx of bleeding and transfuse for hgb <7    Critical care provider statement:    Critical care time (minutes):  109   Critical care time was exclusive of:  Separately billable procedures and  treating other patients   Critical care was necessary to treat or prevent imminent or  life-threatening deterioration of the following conditions:  acute renal failure, septic shock, A/C-HFrEF, GBS-bacteremia, sensorineural hearing loss bilaterally    Critical care was time spent personally by me on the following  activities:  Development of treatment plan with patient or surrogate,  discussions with consultants, evaluation of patient's response to  treatment, examination of patient, obtaining history from patient or  surrogate, ordering and performing treatments and interventions, ordering  and review of laboratory studies and re-evaluation of patient's condition   I assumed direction of critical care for this patient from another  provider in my specialty: no

## 2019-08-19 NOTE — ED Notes (Signed)
Assigned bed @ 1232, spoke with RN Ena Dawley.

## 2019-08-20 DIAGNOSIS — K5520 Angiodysplasia of colon without hemorrhage: Secondary | ICD-10-CM

## 2019-08-20 DIAGNOSIS — D649 Anemia, unspecified: Secondary | ICD-10-CM

## 2019-08-20 DIAGNOSIS — D62 Acute posthemorrhagic anemia: Secondary | ICD-10-CM

## 2019-08-20 DIAGNOSIS — A401 Sepsis due to streptococcus, group B: Secondary | ICD-10-CM

## 2019-08-20 DIAGNOSIS — K921 Melena: Secondary | ICD-10-CM

## 2019-08-20 DIAGNOSIS — R6521 Severe sepsis with septic shock: Secondary | ICD-10-CM

## 2019-08-20 LAB — IRON AND TIBC
Iron: 11 ug/dL — ABNORMAL LOW (ref 45–182)
Saturation Ratios: 7 % — ABNORMAL LOW (ref 17.9–39.5)
TIBC: 155 ug/dL — ABNORMAL LOW (ref 250–450)
UIBC: 144 ug/dL

## 2019-08-20 LAB — CBC
HCT: 26.5 % — ABNORMAL LOW (ref 39.0–52.0)
Hemoglobin: 8.7 g/dL — ABNORMAL LOW (ref 13.0–17.0)
MCH: 25.4 pg — ABNORMAL LOW (ref 26.0–34.0)
MCHC: 32.8 g/dL (ref 30.0–36.0)
MCV: 77.3 fL — ABNORMAL LOW (ref 80.0–100.0)
Platelets: 131 10*3/uL — ABNORMAL LOW (ref 150–400)
RBC: 3.43 MIL/uL — ABNORMAL LOW (ref 4.22–5.81)
RDW: 18.6 % — ABNORMAL HIGH (ref 11.5–15.5)
WBC: 9.2 10*3/uL (ref 4.0–10.5)
nRBC: 0.2 % (ref 0.0–0.2)

## 2019-08-20 LAB — URINALYSIS, COMPLETE (UACMP) WITH MICROSCOPIC
Bacteria, UA: NONE SEEN
Bilirubin Urine: NEGATIVE
Glucose, UA: NEGATIVE mg/dL
Hgb urine dipstick: NEGATIVE
Ketones, ur: NEGATIVE mg/dL
Leukocytes,Ua: NEGATIVE
Nitrite: NEGATIVE
Protein, ur: NEGATIVE mg/dL
Specific Gravity, Urine: 1.015 (ref 1.005–1.030)
pH: 5 (ref 5.0–8.0)

## 2019-08-20 LAB — C DIFFICILE QUICK SCREEN W PCR REFLEX
C Diff antigen: NEGATIVE
C Diff interpretation: NOT DETECTED
C Diff toxin: NEGATIVE

## 2019-08-20 LAB — BASIC METABOLIC PANEL
Anion gap: 10 (ref 5–15)
BUN: 67 mg/dL — ABNORMAL HIGH (ref 8–23)
CO2: 27 mmol/L (ref 22–32)
Calcium: 7.5 mg/dL — ABNORMAL LOW (ref 8.9–10.3)
Chloride: 102 mmol/L (ref 98–111)
Creatinine, Ser: 1.95 mg/dL — ABNORMAL HIGH (ref 0.61–1.24)
GFR calc Af Amer: 36 mL/min — ABNORMAL LOW (ref >60)
GFR calc non Af Amer: 31 mL/min — ABNORMAL LOW (ref >60)
Glucose, Bld: 128 mg/dL — ABNORMAL HIGH (ref 70–99)
Potassium: 2.9 mmol/L — ABNORMAL LOW (ref 3.5–5.1)
Sodium: 139 mmol/L (ref 135–145)

## 2019-08-20 LAB — FERRITIN: Ferritin: 87 ng/mL (ref 24–336)

## 2019-08-20 LAB — POTASSIUM: Potassium: 3 mmol/L — ABNORMAL LOW (ref 3.5–5.1)

## 2019-08-20 LAB — HEMOGLOBIN
Hemoglobin: 9.5 g/dL — ABNORMAL LOW (ref 13.0–17.0)
Hemoglobin: 9.1 g/dL — ABNORMAL LOW (ref 13.0–17.0)

## 2019-08-20 LAB — STREP PNEUMONIAE URINARY ANTIGEN: Strep Pneumo Urinary Antigen: NEGATIVE

## 2019-08-20 MED ORDER — SODIUM CHLORIDE 0.9 % IV SOLN
200.0000 mg | Freq: Once | INTRAVENOUS | Status: AC
  Administered 2019-08-20: 200 mg via INTRAVENOUS
  Filled 2019-08-20: qty 10

## 2019-08-20 MED ORDER — POTASSIUM CHLORIDE 20 MEQ PO PACK
40.0000 meq | PACK | Freq: Once | ORAL | Status: AC
  Administered 2019-08-20: 40 meq via ORAL
  Filled 2019-08-20: qty 2

## 2019-08-20 MED ORDER — POTASSIUM CHLORIDE 10 MEQ/50ML IV SOLN: 10.0000 meq | INTRAVENOUS | Status: DC

## 2019-08-20 MED ORDER — POTASSIUM CHLORIDE CRYS ER 20 MEQ PO TBCR
40.0000 meq | EXTENDED_RELEASE_TABLET | Freq: Once | ORAL | Status: AC
  Administered 2019-08-21: 40 meq via ORAL
  Filled 2019-08-20: qty 2

## 2019-08-20 MED ORDER — POTASSIUM CHLORIDE 10 MEQ/100ML IV SOLN
10.0000 meq | INTRAVENOUS | Status: DC
  Administered 2019-08-20: 10 meq via INTRAVENOUS
  Filled 2019-08-20 (×4): qty 100

## 2019-08-20 NOTE — Progress Notes (Signed)
PROGRESS NOTE    Thomas [name]  ZRA:[id number] DOB: 08-08-1934 DOA: 08/18/2019 PCP: [name], MD   Chief Complaint: Follow-up on pneumonia.   Brief Narrative: Patient is 85 year old male with history of essential hypertension, history of stroke, coronary disease, paroxysmal atrial fibrillation not on anticoagulation, AICD placement, prostate cancer, chronic systolic congestive heart failure, history of GI bleed who presents to the hospital with a fever, chills, cough and shortness of breath.  He was admitted to the hospital on 6/4 with diagnosis of left basilar pneumonia.  Overnight, patient developed significant hypotension, he was transferred to ICU for pressor.  Patient also has black tarry stool with a positive occult blood.  Has been seen by GI, planning for small bowel enteroscopy. Patient also has a positive blood culture with group B streptococcus.  6/6.  Patient is off pressor.  Antibiotics is continued.  Patient to be transferred to medical floor.   Assessment & Plan:   Principal Problem:   HCAP (healthcare-associated pneumonia) Active Problems:   CAD Status post coronary artery bypass grafting: 1995   Permanent atrial fibrillation ([place])   Benign essential HTN   Acid reflux   HLD (hyperlipidemia)   Stroke (cerebrum) (HCC)   Chronic systolic CHF (congestive heart failure) (HCC)   Iron deficiency anemia due to chronic blood loss   Sepsis (HCC)   Acute renal failure superimposed on stage 3a chronic kidney disease (HCC)   Black stool   Iron deficiency anemia  #1.  Septic shock with group B streptococcus septicemia. Secondary to healthcare associated pneumonia. We will continue Rocephin 2 g every 24 hours. Repeat blood culture today.  If repeat cultures are negative, planning for PICC line tomorrow. Obtain TTE, if negative, will obtain TEE to rule out endocarditis.   #2.  Acute blood loss anemia.  With iron deficient anemia. 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  Planning for capsule endoscopy when patient is more stable.  Continue monitor hemoglobin, transfuse up to 8 of hemoglobin due to chronic systolic congestive heart failure.  Patient also had multiple loose stools, check a C. difficile toxin. Patient has iron deficiency on lab test, will give IV iron.  Continue IV Protonix.  #3.  Chronic systolic congestive heart failure. Ejection fraction 40 to 45%.  Currently no exacerbation of congestive heart failure.  4.  Atrial fibrillation. Not on anticoagulation due to concern for GI bleed.  5.  Essential hypertension. Hold blood pressure medicines.  6.  History of stroke. Continue Cozaar.  7.  Acute renal failure superimposed on stage IIIa chronic kidney disease. Renal function improving.  8.  Hypokalemia. Patient only received 40 mEq of oral potassium today, will give another 40 mEq.    DVT prophylaxis: SCDs Code Status: Full Family Communication: None Disposition Plan:  . Patient came from: Home            . Anticipated d/c place: Home . Barriers to d/c OR conditions which need to be met to effect a safe d/c:   Consultants:   Critical care  Gastroenterology.  Procedures: None Antimicrobials:  Rocephin.  Subjective: Patient has been having multiple loose stools today which is dark in color.  No nausea vomiting.  No abdominal pain. No shortness of breath, no hypoxemia.  Objective: Vitals:   08/20/19 0500 08/20/19 0600 08/20/19 0749 08/20/19 0800  BP: 101/66 109/68 106/70 105/80  Pulse: 61 61  (!) 118  Resp: 14 19  (!) 23  Temp:   98 F (36.7  C)   TempSrc:   Oral   SpO2: 98% 95%  97%  Weight:      Height:        Intake/Output Summary (Last 24 hours) at 08/20/2019 1239 Last data filed at 08/20/2019 0800 Gross per 24 hour  Intake 2185.49 ml  Output 965 ml  Net 1220.49 ml   Filed Weights   08/18/19 1346 08/19/19 0224  Weight: 89.5 kg 92.2 kg    Examination:  General exam: Appears calm and comfortable   Respiratory system: Clear to auscultation. Respiratory effort normal. Cardiovascular system: Irregular. No JVD, murmurs, rubs, gallops or clicks. 1+ pedal edema. Gastrointestinal system: Abdomen is nondistended, soft and nontender. No organomegaly or masses felt. Normal bowel sounds heard. Central nervous system: Alert and oriented. No focal neurological deficits. Extremities: Symmetric  Skin: No rashes, lesions or ulcers Psychiatry:  Mood & affect appropriate.     Data Reviewed: I have personally reviewed following labs and imaging studies  CBC: Recent Labs  Lab 08/18/19 1353 08/18/19 2029 08/19/19 0227 08/19/19 0650 08/19/19 1206 08/19/19 2244 08/20/19 0446  WBC 10.9*   < > 15.3* 14.4* 12.1* 9.4 9.2  NEUTROABS 9.4*  --   --   --   --   --   --   HGB 7.4*   < > 7.3* 7.2* 6.7* 8.5* 8.7*  HCT 24.4*   < > 23.5* 23.9* 22.2* 26.9* 26.5*  MCV 78.5*   < > 76.5* 79.7* 79.3* 78.4* 77.3*  PLT 181   < > 160 170 144* 142* 131*   < > = values in this interval not displayed.   Basic Metabolic Panel: Recent Labs  Lab 08/18/19 1353 08/18/19 2351 08/19/19 0227 08/20/19 0446  NA 139  --  139 139  K 3.5  --  3.5 2.9*  CL 99  --  103 102  CO2 28  --  27 27  GLUCOSE 115*  --  139* 128*  BUN 57*  --  67* 67*  CREATININE 2.34*  --  2.57* 1.95*  CALCIUM 7.9*  --  7.4* 7.5*  MG  --  2.0  --   --    GFR: Estimated Creatinine Clearance: 34 mL/min (A) (by C-G formula based on SCr of 1.95 mg/dL (H)). Liver Function Tests: Recent Labs  Lab 08/18/19 1353  AST 46*  ALT 18  ALKPHOS 87  BILITOT 1.1  PROT 4.9*  ALBUMIN 1.9*   No results for input(s): LIPASE, AMYLASE in the last 168 hours. No results for input(s): AMMONIA in the last 168 hours. Coagulation Profile: Recent Labs  Lab 08/18/19 2029  INR 1.6*   Cardiac Enzymes: No results for input(s): CKTOTAL, CKMB, CKMBINDEX, TROPONINI in the last 168 hours. BNP (last 3 results) No results for input(s): PROBNP in the last 8760  hours. HbA1C: No results for input(s): HGBA1C in the last 72 hours. CBG: No results for input(s): GLUCAP in the last 168 hours. Lipid Profile: No results for input(s): CHOL, HDL, LDLCALC, TRIG, CHOLHDL, LDLDIRECT in the last 72 hours. Thyroid Function Tests: No results for input(s): TSH, T4TOTAL, FREET4, T3FREE, THYROIDAB in the last 72 hours. Anemia Panel: Recent Labs    08/20/19 0446  FERRITIN 87  TIBC 155*  IRON 11*   Sepsis Labs: Recent Labs  Lab 08/18/19 1353 08/18/19 1353 08/18/19 1703 08/18/19 2029 08/18/19 2351 08/19/19 0650  PROCALCITON 52.90  --   --   --   --   --   LATICACIDVEN 2.3*   < >  3.6* 2.3* 2.2* 1.4   < > = values in this interval not displayed.    Recent Results (from the past 240 hour(s))  Blood Culture (routine x 2)     Status: Abnormal (Preliminary result)   Collection Time: 08/18/19  2:34 PM   Specimen: BLOOD  Result Value Ref Range Status   Specimen Description   Final    BLOOD BLOOD RIGHT HAND Performed at [facility], [address], [place], [place] [address]    Special Requests   Final    BOTTLES DRAWN AEROBIC AND ANAEROBIC Blood Culture adequate volume Performed at [facility], [address], [place], [place] [address]    Culture  Setup Time   Final    GRAM POSITIVE COCCI IN BOTH AEROBIC AND ANAEROBIC BOTTLES CRITICAL RESULT CALLED TO, READ BACK BY AND VERIFIED WITH: DAVID BESANTI 08/19/19 AT 0050 HS Performed at [facility], [place] [address], [place], [place] [address]    Culture GROUP B STREP(S.AGALACTIAE)ISOLATED (A)  Final   Report Status PENDING  Incomplete  Blood Culture ID Panel (Reflexed)     Status: Abnormal   Collection Time: 08/18/19  2:34 PM  Result Value Ref Range Status   Enterococcus species NOT DETECTED NOT DETECTED Final   Listeria monocytogenes NOT DETECTED NOT DETECTED Final   Staphylococcus species NOT DETECTED NOT DETECTED Final   Staphylococcus aureus (BCID) NOT DETECTED NOT  DETECTED Final   Streptococcus species DETECTED (A) NOT DETECTED Final    Comment: CRITICAL RESULT CALLED TO, READ BACK BY AND VERIFIED WITH: DAVID BESANTI 08/19/19 AT 0050 HS    Streptococcus agalactiae DETECTED (A) NOT DETECTED Final    Comment: CRITICAL RESULT CALLED TO, READ BACK BY AND VERIFIED WITH: DAVID BESANTI 08/19/19 AT 0050 HS    Streptococcus pneumoniae NOT DETECTED NOT DETECTED Final   Streptococcus pyogenes NOT DETECTED NOT DETECTED Final   Acinetobacter baumannii NOT DETECTED NOT DETECTED Final   Enterobacteriaceae species NOT DETECTED NOT DETECTED Final   Enterobacter cloacae complex NOT DETECTED NOT DETECTED Final   Escherichia coli NOT DETECTED NOT DETECTED Final   Klebsiella oxytoca NOT DETECTED NOT DETECTED Final   Klebsiella pneumoniae NOT DETECTED NOT DETECTED Final   Proteus species NOT DETECTED NOT DETECTED Final   Serratia marcescens NOT DETECTED NOT DETECTED Final   Haemophilus influenzae NOT DETECTED NOT DETECTED Final   Neisseria meningitidis NOT DETECTED NOT DETECTED Final   Pseudomonas aeruginosa NOT DETECTED NOT DETECTED Final   Candida albicans NOT DETECTED NOT DETECTED Final   Candida glabrata NOT DETECTED NOT DETECTED Final   Candida krusei NOT DETECTED NOT DETECTED Final   Candida parapsilosis NOT DETECTED NOT DETECTED Final   Candida tropicalis NOT DETECTED NOT DETECTED Final    Comment: Performed at [facility], [address] [place], [place] [address]  Blood Culture (routine x 2)     Status: Abnormal (Preliminary result)   Collection Time: 08/18/19  2:44 PM   Specimen: BLOOD  Result Value Ref Range Status   Specimen Description   Final    BLOOD RIGHT ANTECUBITAL Performed at [facility], [address], [place], [place] [address]    Special Requests   Final    BOTTLES DRAWN AEROBIC AND ANAEROBIC Blood Culture adequate volume Performed at [facility], [address] [place], [place] [address]    Culture   Setup Time   Final    GRAM POSITIVE COCCI IN BOTH AEROBIC AND ANAEROBIC BOTTLES CRITICAL VALUE  NOTED.  VALUE IS CONSISTENT WITH PREVIOUSLY REPORTED AND CALLED VALUE. Performed at [facility], [place], [place] [address]    Culture GROUP B STREP(S.AGALACTIAE)ISOLATED (A)  Final   Report Status PENDING  Incomplete  SARS Coronavirus 2 by RT PCR (hospital order, performed in [facility] hospital lab) Nasopharyngeal Nasopharyngeal Swab     Status: None   Collection Time: 08/18/19  2:50 PM   Specimen: Nasopharyngeal Swab  Result Value Ref Range Status   SARS Coronavirus 2 NEGATIVE NEGATIVE Final    Comment: (NOTE) SARS-CoV-2 target nucleic acids are NOT DETECTED. The SARS-CoV-2 RNA is generally detectable in upper and lower respiratory specimens during the acute phase of infection. The lowest concentration of SARS-CoV-2 viral copies this assay can detect is 250 copies / mL. A negative result does not preclude SARS-CoV-2 infection and should not be used as the sole basis for treatment or other patient management decisions.  A negative result may occur with improper specimen collection / handling, submission of specimen other than nasopharyngeal swab, presence of viral mutation(s) within the areas targeted by this assay, and inadequate number of viral copies (<250 copies / mL). A negative result must be combined with clinical observations, patient history, and epidemiological information. Fact Sheet for Patients:   StrictlyIdeas.no Fact Sheet for Healthcare Providers: BankingDealers.co.za This test is not yet approved or cleared  by the [place] FDA and has been authorized for detection and/or diagnosis of SARS-CoV-2 by FDA under an Emergency Use Authorization (EUA).  This EUA will remain in effect (meaning this test can be used) for the duration of the COVID-19 declaration under Section 564(b)(1) of the Act, 21  U.S.C. section 360bbb-3(b)(1), unless the authorization is terminated or revoked sooner. Performed at [facility], [address] [place], [place] [address]   Culture, sputum-assessment     Status: None   Collection Time: 08/19/19  1:11 AM   Specimen: Sputum  Result Value Ref Range Status   Specimen Description SPUTUM  Final   Special Requests NONE  Final   Sputum evaluation   Final    Sputum specimen not acceptable for testing.  Please recollect.   C/CHARLIE FLEETWOOD AT 2725 08/19/19.PMF Performed at [facility], [address] [place], [place] [address]    Report Status 08/19/2019 FINAL  Final  MRSA PCR Screening     Status: None   Collection Time: 08/19/19  1:30 AM   Specimen: Nasal Mucosa; Nasopharyngeal  Result Value Ref Range Status   MRSA by PCR NEGATIVE NEGATIVE Final    Comment:        The GeneXpert MRSA Assay (FDA approved for NASAL specimens only), is one component of a comprehensive MRSA colonization surveillance program. It is not intended to diagnose MRSA infection nor to guide or monitor treatment for MRSA infections. Performed at [facility], [address] [place], [place] [address]   Gastrointestinal Panel by PCR , Stool     Status: None   Collection Time: 08/19/19 12:56 PM   Specimen: Stool  Result Value Ref Range Status   Campylobacter species NOT DETECTED NOT DETECTED Final   Plesimonas shigelloides NOT DETECTED NOT DETECTED Final   Salmonella species NOT DETECTED NOT DETECTED Final   Yersinia enterocolitica NOT DETECTED NOT DETECTED Final   Vibrio species NOT DETECTED NOT DETECTED Final   Vibrio cholerae NOT DETECTED NOT DETECTED Final   Enteroaggregative E coli (EAEC) NOT DETECTED NOT DETECTED Final   Enteropathogenic E coli (EPEC)  NOT DETECTED NOT DETECTED Final   Enterotoxigenic E coli (ETEC) NOT DETECTED NOT DETECTED Final   Shiga like toxin producing E coli (STEC) NOT DETECTED NOT DETECTED Final    Shigella/Enteroinvasive E coli (EIEC) NOT DETECTED NOT DETECTED Final   Cryptosporidium NOT DETECTED NOT DETECTED Final   Cyclospora cayetanensis NOT DETECTED NOT DETECTED Final   Entamoeba histolytica NOT DETECTED NOT DETECTED Final   Giardia lamblia NOT DETECTED NOT DETECTED Final   Adenovirus F40/41 NOT DETECTED NOT DETECTED Final   Astrovirus NOT DETECTED NOT DETECTED Final   Norovirus GI/GII NOT DETECTED NOT DETECTED Final   Rotavirus A NOT DETECTED NOT DETECTED Final   Sapovirus (I, II, IV, and V) NOT DETECTED NOT DETECTED Final    Comment: Performed at [facility], [address], [place], [place] [address]         Radiology Studies: DG Chest Port 1 View  Result Date: 08/18/2019 CLINICAL DATA:  Fever and weakness EXAM: PORTABLE CHEST 1 VIEW COMPARISON:  July 14, 2019 FINDINGS: There is cardiomegaly with pulmonary vascularity normal. Pacemaker leads are attached to right atrium, right ventricle, and coronary sinus. There is consolidation in the left lower lobe with small left pleural effusion. There is a small right pleural effusion with slight right base atelectasis. Patient is status post internal mammary bypass grafting. There is aortic atherosclerosis. No adenopathy. There is arthropathy in each shoulder. IMPRESSION: Cardiomegaly. Airspace opacity consistent with pneumonia left base. Small pleural effusions bilaterally. Postoperative changes noted. Aortic Atherosclerosis (ICD10-I70.0). Electronically Signed   By: Lowella Grip III M.D.   On: 08/18/2019 14:20        Scheduled Meds: . Chlorhexidine Gluconate Cloth  6 each Topical Daily  . ferrous sulfate  325 mg Oral Daily  . multivitamin with minerals  1 tablet Oral Daily  . pantoprazole (PROTONIX) IV  40 mg Intravenous Q12H  . simvastatin  20 mg Oral Daily   Continuous Infusions: . sodium chloride 50 mL/hr at 08/19/19 1449  . sodium chloride Stopped (08/19/19 0023)  . cefTRIAXone (ROCEPHIN)  IV 2 g  (08/20/19 0258)  . iron sucrose    . norepinephrine (LEVOPHED) Adult infusion Stopped (08/19/19 2100)     LOS: 2 days    Time spent: 34 minutes    Sharen Hones, MD [facility]   To contact the attending provider between 7A-7P or the covering provider during after hours 7P-7A, please log into the web site [email or web address] and access using universal [place] password for that web site. If you do not have the password, please call the hospital operator.  08/20/2019, 12:39 PM

## 2019-08-20 NOTE — Consult Note (Signed)
Thomas Darby, MD [address]  [place]  [place], [place] [address]  Main: [phone]  Fax: [phone] Pager: [phone]   Consultation  Referring Provider:     No ref. provider found Primary Care Physician:  [name], MD Primary Gastroenterologist:  Dr. Alice Reichert         Reason for Consultation:     Melena  Date of Admission:  08/18/2019 Date of Consultation:  08/20/2019         HPI:   Thomas [name] is a 85 y.o. male history of bilateral deafness, hypertension, hyperlipidemia, stroke, coronary disease s/p CABG, paroxysmal A. fib not on anticoagulation, s/p AICD, history of prostate cancer known history of iron deficiency anemia, small bowel AVMs presented with fever, chills, shortness of breath, cough and black stool.  Patient had lactic acidosis, borderline hypotension and therefore admitted on 6/4 with sepsis due to healthcare associated pneumonia.  He is also found to have group B streptococcus bacteremia on 6/4.  Patient is transferred to ICU yesterday secondary to septic shock, requiring Levophed drip.  Patient was noticed to have loose stools that were black in color, he is on oral iron as outpatient.  Labs revealed AKI on admission, hemoglobin on admission 7.4, MCV 78.5, dropped to 6.7 yesterday, requiring blood transfusion with 2 units of PRBCs.  Hemoglobin responded appropriately to 8.7 today.  He does have mild thrombocytopenia.  Most recent EGD in 4/21 by Dr. Alice Reichert revealed duodenal AVMs that were cauterized.  There was no evidence of esophageal varices.  His lactic acidosis has currently resolved, kidney function is improving.  I interviewed the patient with the help of sign language interpreter.  Patient denies abdominal pain, nausea or vomiting.  He reported black stools for last 2 days.  He is currently off pressors.  Sitting up in chair.  He had one large bowel movement which was dark but not black.  He is getting ready to be transferred to the floor.  He  is tolerating diet well.  NSAIDs: None  Antiplts/Anticoagulants/Anti thrombotics: None  GI Procedures:  Upper endoscopy 06/21/2019 - Normal esophagus. - Gastric mucosal atrophy. 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 Treated with argon plasma coagulation (APC). - The examination was otherwise normal. - No specimens collected.  Small bowel endoscopy 09/14/2018 AVMs were detected in duodenum, cauterized  Colonoscopy 07/13/2018 No evidence of GI bleed, rectal polyp removed, internal hemorrhoids DIAGNOSIS:  A. RECTUM POLYP; COLD SNARE:  - TUBULAR ADENOMA.  - NEGATIVE FOR HIGH-GRADE DYSPLASIA AND MALIGNANCY.   Colonoscopy 11/13/2010 Diagnosis:  SPLENIC FLEXURE POLYP HOT SNARE:  - TUBULAR ADENOMA.  - NEGATIVE FOR HIGH GRADE DYSPLASIA AND MALIGNANCY.   Past Medical History:  Diagnosis Date  . AICD (automatic cardioverter/defibrillator) present   . Biventricular ICD (implantable cardioverter-defibrillator) in place 03/24/2005   Implantation of a Med[employer]onic Adapta ADDRO1, serial number [id number]  . CHF (congestive heart failure) ([place])   . CKD (chronic kidney disease), stage III   . Coronary artery disease    a. s/p CABG 1986. b. Multiple PCIs/caths. c. 09/2013: s/p PTCA and BMS to SVG-OM.  [name]   . Dysrhythmia   . History of abdominal aortic aneurysm   . History of bleeding peptic ulcer 1980  . History of epididymitis 2013  . HTN (hypertension)   . Hydronephrosis with ureteropelvic junction obstruction   . Hydroureter on left 2009  . Hypertension   . Ischemic cardiomyopathy    a. Prior EF 30-35%, s/p BIV-ICD.  b. 09/2013: EF 45-50%.  . Moderate tricuspid regurgitation   . PAF (paroxysmal atrial fibrillation) ([place])   . Presence of permanent cardiac pacemaker   . Prostate cancer ([place])   . Status post coronary artery bypass grafting 1986   LIMA to the LAD, SVG to OM, SVG to RCA  . Testicular swelling     Past Surgical History:  Procedure Laterality Date  . 2-D echocardiogram   11/20/2011   Ejection fraction 30-35% moderate concentric left ventricular hypertrophy. Left atrium is moderately dilated. Mild MR. Mild or  . BI-VENTRICULAR IMPLANTABLE CARDIOVERTER DEFIBRILLATOR N/A 12/16/2012   Procedure: BI-VENTRICULAR IMPLANTABLE CARDIOVERTER DEFIBRILLATOR  (CRT-D);  Surgeon: [name], MD;  Location: [facility] CATH LAB;  Service: Cardiovascular;  Laterality: N/A;  . CARDIAC CATHETERIZATION  12/10/2011   SVG to OM widely patent.  LIMA to LAD patent  . CATARACT EXTRACTION W/PHACO Right 10/12/2017   Procedure: CATARACT EXTRACTION PHACO AND INTRAOCULAR LENS PLACEMENT (IOC);  Surgeon: Birder Robson, MD;  Location: [facility] ORS;  Service: Ophthalmology;  Laterality: Right;  [place] 00:57 AP% 15.9 CDE 9.07 Fluid pack lot # [id number]  . COLONOSCOPY N/A 07/13/2018   Procedure: COLONOSCOPY;  Surgeon: Toledo, Benay Pike, MD;  Location: [facility] ENDOSCOPY;  Service: Gastroenterology;  Laterality: N/A;  . CORONARY ARTERY BYPASS GRAFT  1986  . ENTEROSCOPY N/A 09/14/2018   Procedure: ENTEROSCOPY;  Surgeon: Toledo, Benay Pike, MD;  Location: [facility] ENDOSCOPY;  Service: Gastroenterology;  Laterality: N/A;  symptomatic anemia, GI blood loss anemia, melena, positive small bowel capsule endoscopy showing source of bleeding   . ESOPHAGOGASTRODUODENOSCOPY N/A 07/13/2018   Procedure: ESOPHAGOGASTRODUODENOSCOPY (EGD);  Surgeon: Toledo, Benay Pike, MD;  Location: [facility] ENDOSCOPY;  Service: Gastroenterology;  Laterality: N/A;  . ESOPHAGOGASTRODUODENOSCOPY N/A 09/14/2018   Procedure: ESOPHAGOGASTRODUODENOSCOPY (EGD);  Surgeon: Toledo, Benay Pike, MD;  Location: [facility] ENDOSCOPY;  Service: Gastroenterology;  Laterality: N/A;  SIGN LANAGUAGE INTERPRETER  . ESOPHAGOGASTRODUODENOSCOPY N/A 06/21/2019   Procedure: ESOPHAGOGASTRODUODENOSCOPY (EGD);  Surgeon: Toledo, Benay Pike, MD;  Location: [facility] ENDOSCOPY;  Service: Gastroenterology;  Laterality: N/A;  . ESOPHAGOGASTRODUODENOSCOPY (EGD) WITH PROPOFOL N/A 05/27/2018   Procedure:  ESOPHAGOGASTRODUODENOSCOPY (EGD) WITH PROPOFOL;  Surgeon: Clarene Essex, MD;  Location: [facility];  Service: Endoscopy;  Laterality: N/A;  . INSERT / REPLACE / REMOVE PACEMAKER    . LEFT HEART CATHETERIZATION WITH CORONARY/GRAFT ANGIOGRAM N/A 12/10/2011   Procedure: LEFT HEART CATHETERIZATION WITH Beatrix Fetters;  Surgeon: Sanda Klein, MD;  Location: [facility] CATH LAB;  Service: Cardiovascular;  Laterality: N/A;  . LEFT HEART CATHETERIZATION WITH CORONARY/GRAFT ANGIOGRAM N/A 09/25/2013   Procedure: LEFT HEART CATHETERIZATION WITH Beatrix Fetters;  Surgeon: Blane Ohara, MD;  Location: [facility] CATH LAB;  Service: Cardiovascular;  Laterality: N/A;  . Persantine Myoview  05/06/2010   Post-rest ejection fraction 30%. No significant ischemia demonstrated. Compared to previous study there is no significant change.  . TEE WITHOUT CARDIOVERSION N/A 06/22/2019   Procedure: TRANSESOPHAGEAL ECHOCARDIOGRAM (TEE);  Surgeon: Minna Merritts, MD;  Location: [facility] ORS;  Service: Cardiovascular;  Laterality: N/A;  . TRANSURETHRAL RESECTION OF PROSTATE     s/p    Prior to Admission medications   Medication Sig Start Date End Date Taking? Authorizing Provider  albuterol (VENTOLIN HFA) 108 (90 Base) MCG/ACT inhaler Inhale 2 puffs into the lungs 4 (four) times daily as needed. Patient taking differently: Inhale 2 puffs into the lungs 4 (four) times daily as needed for wheezing or shortness of breath.  08/02/18  Yes Croitoru, Mihai, MD  [employer] ACETAMINOPHEN EX ST 500 MG  tablet Take 500-1,000 mg by mouth every 6 (six) hours as needed for pain or fever. 04/26/19  Yes [provider]  ferrous sulfate 325 (65 FE) MG tablet Take 1 tablet (325 mg total) by mouth daily for 30 days. 05/28/18  Yes Elodia Florence., MD  losartan (COZAAR) 25 MG tablet Take 0.5 tablets (12.5 mg total) by mouth daily. 06/26/19  Yes Danford, Suann Larry, MD  metolazone (ZAROXOLYN) 5 MG tablet Take 5 mg by mouth daily as  needed. TAKE FOR WEIGHT GAIN >3lbs IN A DAY OR >5Llbs IN A WEEK   Yes [provider]  Multiple Vitamin (MULTIVITAMIN WITH MINERALS) TABS tablet Take 1 tablet by mouth daily. 05/29/19  Yes Nicole Kindred A, DO  pantoprazole (PROTONIX) 40 MG tablet Take 1 tablet (40 mg total) by mouth 2 (two) times daily before a meal. 06/25/19  Yes Danford, Suann Larry, MD  potassium chloride SA (KLOR-CON) 20 MEQ tablet Take 1 tablet (20 mEq total) by mouth 2 (two) times daily. 06/25/19  Yes Danford, Suann Larry, MD  simvastatin (ZOCOR) 20 MG tablet TAKE 1 TABLET BY MOUTH EVERY DAY Patient taking differently: Take 20 mg by mouth daily.  04/07/19  Yes Kilroy, Doreene Burke, PA-C  spironolactone (ALDACTONE) 25 MG tablet Take 1 tablet (25 mg total) by mouth daily. 08/01/19 10/30/19 Yes Hackney, Otila Kluver A, FNP  torsemide (DEMADEX) 20 MG tablet Take 2 tablets (40 mg total) by mouth 2 (two) times daily. Patient taking differently: Take 80 mg by mouth daily.  06/25/19  Yes Danford, Suann Larry, MD  triamcinolone cream (KENALOG) 0.1 % Apply 1 application topically 2 (two) times daily as needed for itching. (avoid face, groin and axilla) 04/17/19  Yes [provider]    Current Facility-Administered Medications:  .  0.9 %  sodium chloride infusion, , Intravenous, Continuous, Blakeney, Dreama Saa, NP, Last Rate: 50 mL/hr at 08/19/19 1449, New Bag at 08/19/19 1449 .  0.9 %  sodium chloride infusion, 250 mL, Intravenous, Continuous, Awilda Bill, NP, Stopped at 08/19/19 0023 .  acetaminophen (TYLENOL) tablet 650 mg, 650 mg, Oral, Q6H PRN, Ivor Costa, MD, 650 mg at 08/18/19 1902 .  albuterol (PROVENTIL) (2.5 MG/3ML) 0.083% nebulizer solution 3 mL, 3 mL, Inhalation, Q4H PRN, Ivor Costa, MD .  cefTRIAXone (ROCEPHIN) 2 g in sodium chloride 0.9 % 100 mL IVPB, 2 g, Intravenous, Q24H, Blakeney, Dana G, NP, Last Rate: 200 mL/hr at 08/20/19 0258, 2 g at 08/20/19 0258 .  Chlorhexidine Gluconate Cloth 2 % PADS 6 each, 6 each,  Topical, Daily, Awilda Bill, NP, 6 each at 08/20/19 1203 .  dextromethorphan-guaiFENesin ([place] DM) 30-600 MG per 12 hr tablet 1 tablet, 1 tablet, Oral, BID PRN, Ivor Costa, MD .  ferrous sulfate tablet 325 mg, 325 mg, Oral, Daily, Ivor Costa, MD, 325 mg at 08/20/19 0907 .  multivitamin with minerals tablet 1 tablet, 1 tablet, Oral, Daily, Ivor Costa, MD, 1 tablet at 08/20/19 0907 .  norepinephrine (LEVOPHED) 7m in 2523mpremix infusion, 2-10 mcg/min, Intravenous, Titrated, BlAwilda BillNP, Stopped at 08/19/19 2100 .  ondansetron (ZOFRAN) injection 4 mg, 4 mg, Intravenous, Q8H PRN, NiIvor CostaMD .  pantoprazole (PROTONIX) injection 40 mg, 40 mg, Intravenous, Q12H, NiIvor CostaMD, 40 mg at 08/20/19 0907 .  simvastatin (ZOCOR) tablet 20 mg, 20 mg, Oral, Daily, NiIvor CostaMD, 20 mg at 08/19/19 1752   Family History  Problem Relation Age of Onset  . Hypertension Father  Social History   Tobacco Use  . Smoking status: Former Smoker    Quit date: 03/15/1985    Years since quitting: 34.4  . Smokeless tobacco: Never Used  Substance Use Topics  . Alcohol use: No    Comment: occas.  . Drug use: No    Allergies as of 08/18/2019 - Review Complete 08/18/2019  Allergen Reaction Noted  . Entresto [sacubitril-valsartan] Swelling 11/09/2018  . Phenazopyridine Nausea Only and Other (See Comments) 10/05/2013  . Ramipril Other (See Comments) 05/07/2014    Review of Systems:    All systems reviewed and negative except where noted in HPI.   Physical Exam:  Vital signs in last 24 hours: Temp:  [94.5 F (34.7 C)-98.6 F (37 C)] 98 F (36.7 C) (06/06 0749) Pulse Rate:  [42-118] 118 (06/06 0800) Resp:  [14-33] 23 (06/06 0800) BP: (68-109)/(51-80) 105/80 (06/06 0800) SpO2:  [91 %-100 %] 97 % (06/06 0800) Last BM Date: 08/19/19 General:   Pleasant, cooperative in NAD Head:  Normocephalic and atraumatic. Eyes:   No icterus.   Conjunctiva pink. PERRLA. Ears: Bilateral hearing  loss Neck:  Supple; no masses or thyroidomegaly Lungs: Respirations even and unlabored. Lungs clear to auscultation bilaterally.   No wheezes, crackles, or rhonchi.  Heart:  Regular rate and rhythm;  Without murmur, clicks, rubs or gallops Abdomen:  Soft, nondistended, nontender. Normal bowel sounds. No appreciable masses or hepatomegaly.  No rebound or guarding.  Rectal:  Not performed. Msk:  Symmetrical without gross deformities.  Strength normal Extremities:  Without edema, cyanosis or clubbing. Neurologic:  Alert and oriented x3;  grossly normal neurologically. Skin:  Intact without significant lesions or rashes. Psych:  Alert and cooperative. Normal affect.  LAB RESULTS: CBC Latest Ref Rng & Units 08/20/2019 08/19/2019 08/19/2019  WBC 4.0 - 10.5 K/uL 9.2 9.4 12.1(H)  Hemoglobin 13.0 - 17.0 g/dL 8.7(L) 8.5(L) 6.7(L)  Hematocrit 39.0 - 52.0 % 26.5(L) 26.9(L) 22.2(L)  Platelets 150 - 400 K/uL 131(L) 142(L) 144(L)    BMET BMP Latest Ref Rng & Units 08/20/2019 08/19/2019 08/18/2019  Glucose 70 - 99 mg/dL 128(H) 139(H) 115(H)  BUN 8 - 23 mg/dL 67(H) 67(H) 57(H)  Creatinine 0.61 - 1.24 mg/dL 1.95(H) 2.57(H) 2.34(H)  BUN/Creat Ratio 10 - 24 - - -  Sodium 135 - 145 mmol/L 139 139 139  Potassium 3.5 - 5.1 mmol/L 2.9(L) 3.5 3.5  Chloride 98 - 111 mmol/L 102 103 99  CO2 22 - 32 mmol/L _0 Calcium 8.9 - 10.3 mg/dL 7.5(L) 7.4(L) 7.9(L)    LFT Hepatic Function Latest Ref Rng & Units 08/18/2019 06/25/2019 06/17/2019  Total Protein 6.5 - 8.1 g/dL 4.9(L) 4.5(L) 4.4(L)  Albumin 3.5 - 5.0 g/dL 1.9(L) 1.7(L) 1.7(L)  AST 15 - 41 U/L 46(H) 25 44(H)  ALT 0 - 44 U/L _1 Alk Phosphatase 38 - 126 U/L 87 95 80  Total Bilirubin 0.3 - 1.2 mg/dL 1.1 0.9 1.1  Bilirubin, Direct 0.0 - 0.2 mg/dL - - -     STUDIES: DG Chest Port 1 View  Result Date: 08/18/2019 CLINICAL DATA:  Fever and weakness EXAM: PORTABLE CHEST 1 VIEW COMPARISON:  July 14, 2019 FINDINGS: There is cardiomegaly with pulmonary  vascularity normal. Pacemaker leads are attached to right atrium, right ventricle, and coronary sinus. There is consolidation in the left lower lobe with small left pleural effusion. There is a small right pleural effusion with slight right base atelectasis. Patient is status post internal mammary bypass grafting.  There is aortic atherosclerosis. No adenopathy. There is arthropathy in each shoulder. IMPRESSION: Cardiomegaly. Airspace opacity consistent with pneumonia left base. Small pleural effusions bilaterally. Postoperative changes noted. Aortic Atherosclerosis (ICD10-I70.0). Electronically Signed   By: Lowella Grip III M.D.   On: 08/18/2019 14:20      Impression / Plan:   Thomas [name] is a 85 y.o. male with multiple comorbidities, coronary disease s/p CABG, s/p AICD, paroxysmal A. fib admitted with septic shock secondary to group B strep pneumonia, bacteremia, AKI, melena  Melena: Known history of small bowel AVMs, other differentials include erosive esophagitis or peptic ulcer disease He is also on oral iron supplements, currently his stools are dark green and he responded appropriately to blood transfusion.  Therefore, recommend nonurgent small bowel enteroscopy or video capsule study when patient is hemodynamically stable and give at least 24 to 48 hours before proceeding with endoscopic intervention Patient is currently oxygenating well on room air Recommend pantoprazole 44m twice daily and continue as outpatient Monitor CBC closely, maintain hemoglobin greater than 8 given history of ischemic cardiomyopathy and stroke Diet as tolerated   Thank you for involving me in the care of this patient.  GI will follow along with you    LOS: 2 days   RSherri Sear MD  08/20/2019, 12:28 PM   Note: This dictation was prepared with Dragon dictation along with smaller phrase technology. Any transcriptional errors that result from this process are unintentional.

## 2019-08-20 NOTE — Evaluation (Signed)
Physical Therapy Evaluation Patient Details Name: Thomas [name] MRN: [id number] DOB: 16-Jan-1935 Today's Date: 08/20/2019   History of Present Illness  Pt is 85 y/o male with a PMH of CABG (1986), Prostate Cancer, Pacemaker, Paroxysmal Atrial Fibrillation, Moderate Tricuspid Regurgitation, Ischemic Cardiomyopathy, HTN, Hydronephrosis with Ureteropelvic Junction Obstruction, Epididymitis, Bleeding Peptic Ulcer, Abdominal Aortic Aneurysm, Deafness, CAD, Stage III CKD, CHF, and AICD.  He presented to [facility] ER on 06/4 with weakness (onset 06/3) and fever (temp 101.3 the morning of 06/4).  Pt also endorsed some shortness of breath and a mild cough. Pt to ICU d/t hypotension requiring Levo gtt. Pt adm d/t HCAP.  Clinical Impression  Pt is a pleasant 85 year old male who was admitted for HCAP. Currently on RA at time of evaluation with O2 sats WNL and BP improved this date. ASL interpreter used, [name] [phone]. Left iPad in room. Pt performs bed mobility/transfers with mod assist and RW. Further ambulation deferred due to poor endurance and incontient BM. Pt demonstrates deficits with strength/balance/endurance. Reports he was working with HHPT, however doesn't appear to be at baseline level. Would benefit from skilled PT to address above deficits and promote optimal return to PLOF; recommend transition to STR upon discharge from acute hospitalization.     Follow Up Recommendations SNF    Equipment Recommendations  (TBD)    Recommendations for Other Services       Precautions / Restrictions Precautions Precautions: Fall Restrictions Weight Bearing Restrictions: No      Mobility  Bed Mobility Overal bed mobility: Needs Assistance Bed Mobility: Supine to Sit     Supine to sit: Mod assist     General bed mobility comments: needs assist for trunk stability. Able to scoot out towards EOB with cues  Transfers Overall transfer level: Needs assistance Equipment used: Rolling walker (2  wheeled) Transfers: Sit to/from Stand Sit to Stand: Mod assist         General transfer comment: Needs several attempts and bed slightly elevated. Poor endurance with only able to stand briefly. INcontinent BM upon standing  Ambulation/Gait             General Gait Details: Pt impulsive and incontinent episode. Not safe to ambulate at this time  Stairs            Wheelchair Mobility    Modified Rankin (Stroke Patients Only)       Balance Overall balance assessment: History of Falls;Needs assistance Sitting-balance support: Feet supported Sitting balance-Leahy Scale: Good     Standing balance support: Bilateral upper extremity supported Standing balance-Leahy Scale: Fair Standing balance comment: post lean                             Pertinent Vitals/Pain Pain Assessment: Faces Faces Pain Scale: Hurts a little bit Pain Location: points to IV site Pain Descriptors / Indicators: Grimacing Pain Intervention(s): Limited activity within patient's tolerance;Repositioned    Home Living Family/patient expects to be discharged to:: Private residence Living Arrangements: Spouse/significant other Available Help at Discharge: Family;Available 24 hours/day Type of Home: House Home Access: Level entry     Home Layout: One level Home Equipment: Walker - 2 wheels;Cane - single point Additional Comments: Lives with wife and adult son     Prior Function Level of Independence: Independent with assistive device(s)         Comments: Reports he's been using RW more frequently and has been receiving HHPT  Hand Dominance        Extremity/Trunk Assessment   Upper Extremity Assessment Upper Extremity Assessment: Generalized weakness(B UE grossly 4/5)    Lower Extremity Assessment Lower Extremity Assessment: Generalized weakness(B LE grossly 4/5)       Communication   Communication: Deaf;Interpreter utilized  Cognition Arousal/Alertness:  Awake/alert Behavior During Therapy: WFL for tasks assessed/performed Overall Cognitive Status: Within Functional Limits for tasks assessed                                 General Comments: Pt impulsive with activity      General Comments      Exercises Other Exercises Other Exercises: Pt with incontient diarrhea upon standing. Returned to seated position and initial hygiene performed with RN at bedside. Upon 2 more attempts for standing and more BM, returned to seated. RN and aide to assist for further hygiene   Assessment/Plan    PT Assessment Patient needs continued PT services  PT Problem List Decreased strength;Decreased activity tolerance;Decreased balance;Decreased mobility;Decreased safety awareness       PT Treatment Interventions Gait training;DME instruction;Therapeutic exercise;Balance training    PT Goals (Current goals can be found in the Care Plan section)  Acute Rehab PT Goals Patient Stated Goal: to go home PT Goal Formulation: With patient Time For Goal Achievement: 09/03/19 Potential to Achieve Goals: Good    Frequency Min 2X/week   Barriers to discharge        Co-evaluation               AM-PAC PT "6 Clicks" Mobility  Outcome Measure Help needed turning from your back to your side while in a flat bed without using bedrails?: A Little Help needed moving from lying on your back to sitting on the side of a flat bed without using bedrails?: A Little Help needed moving to and from a bed to a chair (including a wheelchair)?: A Lot Help needed standing up from a chair using your arms (e.g., wheelchair or bedside chair)?: A Lot Help needed to walk in hospital room?: Total Help needed climbing 3-5 steps with a railing? : Total 6 Click Score: 12    End of Session Equipment Utilized During Treatment: Gait belt Activity Tolerance: Patient limited by fatigue Patient left: in bed;with nursing/sitter in room Nurse Communication: Mobility  status PT Visit Diagnosis: History of falling (Z91.81);Difficulty in walking, not elsewhere classified (R26.2);Muscle weakness (generalized) (M62.81);Unsteadiness on feet (R26.81)    Time: 1610-9604 PT Time Calculation (min) (ACUTE ONLY): 41 min   Charges:   PT Evaluation $PT Eval Moderate Complexity: 1 Mod PT Treatments $Therapeutic Activity: 23-37 mins        [name], PT, DPT [phone]   Lanee Chain 08/20/2019, 2:25 PM

## 2019-08-20 NOTE — Progress Notes (Signed)
Name: Thomas [name] MRN: [id number] DOB: 27-Jan-1935    ADMISSION DATE:  08/18/2019 CONSULTATION DATE: 08/18/2019  REFERRING MD : Sharion Settler, NP  CHIEF COMPLAINT: Hypotension   BRIEF PATIENT DESCRIPTION:  85 yo male admitted with septic shock secondary to pneumonia and group B strep bacteremia requiring levophed gtt   SIGNIFICANT EVENTS/STUDIES:  06/4: Pt initially admitted to the progressive care unit, but remained in the ER pending bed availability 06/5: Pt developed hypotension requiring levophed gtt, therefore pt changed to ICU status and PCCM contacted to assist with management 08/20/19- patient clinically improved, no events today. S/p GI eval   HISTORY OF PRESENT ILLNESS:  This is an 85 yo male with a PMH of CABG (1986), Prostate Cancer, Pacemaker, Paroxysmal Atrial Fibrillation, Moderate Tricuspid Regurgitation, Ischemic Cardiomyopathy, HTN, Hydronephrosis with Ureteropelvic Junction Obstruction, Epididymitis, Bleeding Peptic Ulcer, Abdominal Aortic Aneurysm, Deafness, CAD, Stage III CKD, CHF, and AICD.  He presented to [facility] ER on 06/4 with weakness (onset 06/3) and fever (temp 101.3 the morning of 06/4).  Pt also endorsed some shortness of breath and a mild cough. ER labs were: glucose 115, BUN 57, creatinine 2.34, albumin 1.9, BNP 588.8, lactic acid 2.3, pct 52.90, wbc 10.9, hgb 7.4, and  platelets 160.  COVID-19 negative, however CXR concerning for pneumonia.  Pt received vancomycin, cefepime, 500 ml NS bolus, and 1L LR bolus.  He was initially admitted to the progressive care unit per hospitalist team.  However, while awaiting in the ER for progressive care bed availability he became hypotensive with sbp 70-low 80's.  He received 25 g of albumin, but remained hypotensive requiring levophed gtt.  PCCM team consulted per hospitalist team to assist with management.    PAST MEDICAL HISTORY :   has a past medical history of AICD (automatic cardioverter/defibrillator) present,  Biventricular ICD (implantable cardioverter-defibrillator) in place (03/24/2005), CHF (congestive heart failure) ([place]), CKD (chronic kidney disease), stage III, Coronary artery disease, Deaf, Dysrhythmia, History of abdominal aortic aneurysm, History of bleeding peptic ulcer (1980), History of epididymitis (2013), HTN (hypertension), Hydronephrosis with ureteropelvic junction obstruction, Hydroureter on left (2009), Hypertension, Ischemic cardiomyopathy, Moderate tricuspid regurgitation, PAF (paroxysmal atrial fibrillation) ([place]), Presence of permanent cardiac pacemaker, Prostate cancer ([place]), Status post coronary artery bypass grafting (1986), and Testicular swelling.  has a past surgical history that includes Coronary artery bypass graft (1986); Insert / replace / remove pacemaker; Transurethral resection of prostate; Cardiac catheterization (12/10/2011); 2-D echocardiogram (11/20/2011); Persantine Myoview (05/06/2010); left heart catheterization with coronary/graft angiogram (N/A, 12/10/2011); bi-ventricular implantable cardioverter defibrillator (N/A, 12/16/2012); left heart catheterization with coronary/graft angiogram (N/A, 09/25/2013); Cataract extraction w/PHACO (Right, 10/12/2017); Esophagogastroduodenoscopy (egd) with propofol (N/A, 05/27/2018); Esophagogastroduodenoscopy (N/A, 07/13/2018); Colonoscopy (N/A, 07/13/2018); Esophagogastroduodenoscopy (N/A, 09/14/2018); enteroscopy (N/A, 09/14/2018); Esophagogastroduodenoscopy (N/A, 06/21/2019); and TEE without cardioversion (N/A, 06/22/2019). Prior to Admission medications   Medication Sig Start Date End Date Taking? Authorizing Provider  albuterol (VENTOLIN HFA) 108 (90 Base) MCG/ACT inhaler Inhale 2 puffs into the lungs 4 (four) times daily as needed. Patient taking differently: Inhale 2 puffs into the lungs 4 (four) times daily as needed for wheezing or shortness of breath.  08/02/18  Yes Croitoru, Mihai, MD  [employer] ACETAMINOPHEN EX ST 500 MG tablet Take 500-1,000 mg  by mouth every 6 (six) hours as needed for pain or fever. 04/26/19  Yes [provider]  ferrous sulfate 325 (65 FE) MG tablet Take 1 tablet (325 mg total) by mouth daily for 30 days. 05/28/18  Yes Elodia [place]., MD  losartan (  COZAAR) 25 MG tablet Take 0.5 tablets (12.5 mg total) by mouth daily. 06/26/19  Yes Danford, Suann Larry, MD  metolazone (ZAROXOLYN) 5 MG tablet Take 5 mg by mouth daily as needed. TAKE FOR WEIGHT GAIN >3lbs IN A DAY OR >5Llbs IN A WEEK   Yes [provider]  Multiple Vitamin (MULTIVITAMIN WITH MINERALS) TABS tablet Take 1 tablet by mouth daily. 05/29/19  Yes Nicole Kindred A, DO  pantoprazole (PROTONIX) 40 MG tablet Take 1 tablet (40 mg total) by mouth 2 (two) times daily before a meal. 06/25/19  Yes Danford, Suann Larry, MD  potassium chloride SA (KLOR-CON) 20 MEQ tablet Take 1 tablet (20 mEq total) by mouth 2 (two) times daily. 06/25/19  Yes Danford, Suann Larry, MD  simvastatin (ZOCOR) 20 MG tablet TAKE 1 TABLET BY MOUTH EVERY DAY Patient taking differently: Take 20 mg by mouth daily.  04/07/19  Yes Kilroy, Doreene Burke, PA-C  spironolactone (ALDACTONE) 25 MG tablet Take 1 tablet (25 mg total) by mouth daily. 08/01/19 10/30/19 Yes Hackney, Otila Kluver A, FNP  torsemide (DEMADEX) 20 MG tablet Take 2 tablets (40 mg total) by mouth 2 (two) times daily. Patient taking differently: Take 80 mg by mouth daily.  06/25/19  Yes Danford, Suann Larry, MD  triamcinolone cream (KENALOG) 0.1 % Apply 1 application topically 2 (two) times daily as needed for itching. (avoid face, groin and axilla) 04/17/19  Yes [provider]   Allergies  Allergen Reactions  . Entresto [Sacubitril-Valsartan] Swelling    And bruising of arm  . Phenazopyridine Nausea Only and Other (See Comments)    GI UPSET  . Ramipril Other (See Comments)    unk Other reaction(s): Other (See Comments), Unknown unk    FAMILY HISTORY:  family history includes Hypertension in his father. SOCIAL  HISTORY:  reports that he quit smoking about 34 years ago. He has never used smokeless tobacco. He reports that he does not drink alcohol or use drugs.  REVIEW OF SYSTEMS: Positives in BOLD  Constitutional: fever, chills, weight loss, malaise/fatigue and diaphoresis.  HENT: Negative for hearing loss, ear pain, nosebleeds, congestion, sore throat, neck pain, tinnitus and ear discharge.   Eyes: Negative for blurred vision, double vision, photophobia, pain, discharge and redness.  Respiratory: Negative for cough, hemoptysis, sputum production, shortness of breath, wheezing and stridor.   Cardiovascular: Negative for chest pain, palpitations, orthopnea, claudication, leg swelling and PND.  Gastrointestinal: Negative for heartburn, nausea, vomiting, abdominal pain, diarrhea, constipation, blood in stool and melena.  Genitourinary: Negative for dysuria, urgency, frequency, hematuria and flank pain.  Musculoskeletal: Negative for myalgias, back pain, joint pain and falls.  Skin: Negative for itching and rash.  Neurological: dizziness, tingling, tremors, sensory change, speech change, focal weakness, seizures, loss of consciousness, weakness and headaches.  Endo/Heme/Allergies: Negative for environmental allergies and polydipsia. Does not bruise/bleed easily.  SUBJECTIVE:  No complaints at this time   VITAL SIGNS: Temp:  [97.5 F (36.4 C)-98.6 F (37 C)] 98 F (36.7 C) (06/06 1500) Pulse Rate:  [42-118] 59 (06/06 1500) Resp:  [14-29] 18 (06/06 1500) BP: (68-113)/(51-80) 111/65 (06/06 1500) SpO2:  [91 %-100 %] 100 % (06/06 1500)  PHYSICAL EXAMINATION: General: chronically ill appearing male resting in bed, NAD Neuro: alert, follows commands, PERRLA HEENT: supple, no JVD  Cardiovascular:paced with BBB, no R/G Lungs: faint rhonchi throughout, even, non labored Abdomen: +BS x4, soft, non tender, non distended  Musculoskeletal: moves all extremities, 3+ bilateral lower extremity pitting edema    Skin: no  rashes or lesions present   Recent Labs  Lab 08/18/19 1353 08/19/19 0227 08/20/19 0446  NA 139 139 139  K 3.5 3.5 2.9*  CL 99 103 102  CO2 28 27 27   BUN 57* 67* 67*  CREATININE 2.34* 2.57* 1.95*  GLUCOSE 115* 139* 128*   Recent Labs  Lab 08/19/19 1206 08/19/19 1206 08/19/19 2244 08/20/19 0446 08/20/19 1322  HGB 6.7*   < > 8.5* 8.7* 9.5*  HCT 22.2*  --  26.9* 26.5*  --   WBC 12.1*  --  9.4 9.2  --   PLT 144*  --  142* 131*  --    < > = values in this interval not displayed.   No results found.     ASSESSMENT / PLAN:  Group B streptoccocus bacteremia and possible pneumonia  -patient with watery diarreah - as possible source- query diverticular/hemmoroidal lesion -Abnormal CXR in context of advanced HFrEF and cannot rule out infectious infiltrate.  -c/w Rocephin Trend PCT  Strep pneumoniae urinary antigen and legionella results pending    Acute on chronic systolic CHF HFrEF-EF 76%-16% Hx: CAD s/p CABG, paroxysmal atrial fibrillation, abdominal aortic aneurysm, AICD, pacemaker  Continuous telemetry monitoring   prn levophed gtt to maintain map >65 Hold outpatient antihypertensives Continue outpatient simvastatin    Bilateral interstitial and airspace opacification  - possible CHF vs CAP vs Asp PNA related  - currently on empiric ABX  - patient is on vasopressor support   -IVF   Watery diarreah     - GI - PCR stool panel     - FOBT     - continue Rocephin  Acute on chronic renal failure -stage 3 Lactic acidosis-improving  Trend BMP  Replace electrolytes as indicated  Monitor UOP Avoid nephrotoxic medications  IVF  Iron deficiency anemia (no s/sx of acute blood loss) VTE px: SCD's, avoid chemical prophylaxis for now  Trend CBC  Monitor for s/sx of bleeding and transfuse for hgb <7    Critical care provider statement:    Critical care time (minutes):  33   Critical care time was exclusive of:  Separately billable procedures and   treating other patients   Critical care was necessary to treat or prevent imminent or  life-threatening deterioration of the following conditions:  acute renal failure, septic shock, A/C-HFrEF, GBS-bacteremia, sensorineural hearing loss bilaterally    Critical care was time spent personally by me on the following  activities:  Development of treatment plan with patient or surrogate,  discussions with consultants, evaluation of patient's response to  treatment, examination of patient, obtaining history from patient or  surrogate, ordering and performing treatments and interventions, ordering  and review of laboratory studies and re-evaluation of patient's condition   I assumed direction of critical care for this patient from another  provider in my specialty: no

## 2019-08-20 NOTE — Progress Notes (Signed)
Pt has remained stable throughout shift. Had a very large BM black tarry, GI notified. Cdiff being tested. Currently on enteric precautions. 2 IV's pulled due to being infiltrated and new IV placed by IV team. Pt currently resting comfortably in bed.

## 2019-08-21 ENCOUNTER — Encounter: Payer: PPO | Admitting: Cardiovascular Disease

## 2019-08-21 ENCOUNTER — Inpatient Hospital Stay
Admit: 2019-08-21 | Discharge: 2019-08-21 | Disposition: A | Discharge status: Home / Self Care | Payer: PPO | Attending: Internal Medicine | Admitting: Internal Medicine

## 2019-08-21 DIAGNOSIS — B951 Streptococcus, group B, as the cause of diseases classified elsewhere: Secondary | ICD-10-CM

## 2019-08-21 DIAGNOSIS — R7881 Bacteremia: Secondary | ICD-10-CM

## 2019-08-21 LAB — BASIC METABOLIC PANEL
Anion gap: 5 (ref 5–15)
BUN: 48 mg/dL — ABNORMAL HIGH (ref 8–23)
CO2: 28 mmol/L (ref 22–32)
Calcium: 7.8 mg/dL — ABNORMAL LOW (ref 8.9–10.3)
Chloride: 108 mmol/L (ref 98–111)
Creatinine, Ser: 1.42 mg/dL — ABNORMAL HIGH (ref 0.61–1.24)
GFR calc Af Amer: 53 mL/min — ABNORMAL LOW (ref >60)
GFR calc non Af Amer: 46 mL/min — ABNORMAL LOW (ref >60)
Glucose, Bld: 103 mg/dL — ABNORMAL HIGH (ref 70–99)
Potassium: 3.3 mmol/L — ABNORMAL LOW (ref 3.5–5.1)
Sodium: 141 mmol/L (ref 135–145)

## 2019-08-21 LAB — ECHOCARDIOGRAM COMPLETE
Height: 74 in
Weight: 3252.23 oz

## 2019-08-21 LAB — URINE CULTURE: Culture: NO GROWTH

## 2019-08-21 LAB — HEMOGLOBIN
Hemoglobin: 8.8 g/dL — ABNORMAL LOW (ref 13.0–17.0)
Hemoglobin: 9.3 g/dL — ABNORMAL LOW (ref 13.0–17.0)

## 2019-08-21 LAB — CULTURE, BLOOD (ROUTINE X 2)
Special Requests: ADEQUATE
Special Requests: ADEQUATE

## 2019-08-21 LAB — LEGIONELLA PNEUMOPHILA SEROGP 1 UR AG: L. pneumophila Serogp 1 Ur Ag: NEGATIVE

## 2019-08-21 LAB — MAGNESIUM: Magnesium: 2.2 mg/dL (ref 1.7–2.4)

## 2019-08-21 NOTE — Progress Notes (Signed)
SHIFT SUMMARY:    Patient has been pleasant and cooperative throughout this shift. He has been resting comfortably while watching television.   He communicates his needs well. The patient had an Echocardiogram this morning.  There have been no signs of bleeding at this time.    Dr. Terri Piedra confirmed that she is planning to do the patients Endoscopy tomorrow 08/22/19 so the patient needs to be NPO after midnight.  Called and left a message for Mrs. Mccorkle, the patients spouse in order to update her on the patients progress.

## 2019-08-21 NOTE — Consult Note (Signed)
NAME: Thomas [name]  DOB: December 05, 1934  MRN: [id number]  Date/Time: 08/21/2019 12:48 PM  REQUESTING PROVIDER: Dr. Roosevelt Locks Subjective:  REASON FOR CONSULT: Group B streptococcus bacteremia  Patient has hearing and speech disability  ?Thomas [name] is a 85 y.o. male with a history of CHF, CAD, CABG, AICD, GI bleed, paroxysmal A. fib, prostate CA, recent group B streptococcus bacteremia treated with 4 weeks of IV antibiotics in April 2021 Presented to the ED through EMS on 08/18/2019 because of weakness of 2  days duration and fever of 1 day.  In the ED temperature was 101.3, heart rate of 76, pulse ox of 93% and BP of 93/43.  Patient was also complaining of black stools.  He was also having mild cough and mild shortness of breath.  In the ED the WBC was 10.9, lactate of 2.3, WBC of 10.9, hemoglobin of 7.4 and platelet of 181.  Procalcitonin was 52.  Creatinine was 2.34, BNP 588.  Blood cultures were sent And he was started on azithromycin and ceftriaxone initially which was then changed to Vanco cefepime and as his blood culture now is group B streptococcus.  Day after his admission he developed hypotension requiring Levophed drip and was transferred to the ICU.  Patient is clinically improved.  I am seeing the patient for the bacteremia. Patient was previously hospitalized between 4-21 and 06/25/2019.  He had group B streptococcus bacteremia.  TEE was done which did not show any vegetations but there was a PFO.  Patient also has an AICD.  After initial IV penicillin in the hospital he was discharged on IV ceftriaxone until 07/14/2019.  During that hospitalization he also had edema of his legs and was started on IV diuretics and then Unna boots. Past Medical History:  Diagnosis Date  . AICD (automatic cardioverter/defibrillator) present   . Biventricular ICD (implantable cardioverter-defibrillator) in place 03/24/2005   Implantation of a Med[employer]onic Adapta ADDRO1, serial number [id number]  . CHF (congestive  heart failure) ([place])   . CKD (chronic kidney disease), stage III   . Coronary artery disease    a. s/p CABG 1986. b. Multiple PCIs/caths. c. 09/2013: s/p PTCA and BMS to SVG-OM.  [name]   . Dysrhythmia   . History of abdominal aortic aneurysm   . History of bleeding peptic ulcer 1980  . History of epididymitis 2013  . HTN (hypertension)   . Hydronephrosis with ureteropelvic junction obstruction   . Hydroureter on left 2009  . Hypertension   . Ischemic cardiomyopathy    a. Prior EF 30-35%, s/p BIV-ICD. b. 09/2013: EF 45-50%.  . Moderate tricuspid regurgitation   . PAF (paroxysmal atrial fibrillation) ([place])   . Presence of permanent cardiac pacemaker   . Prostate cancer ([place])   . Status post coronary artery bypass grafting 1986   LIMA to the LAD, SVG to OM, SVG to RCA  . Testicular swelling     Past Surgical History:  Procedure Laterality Date  . 2-D echocardiogram  11/20/2011   Ejection fraction 30-35% moderate concentric left ventricular hypertrophy. Left atrium is moderately dilated. Mild MR. Mild or  . BI-VENTRICULAR IMPLANTABLE CARDIOVERTER DEFIBRILLATOR N/A 12/16/2012   Procedure: BI-VENTRICULAR IMPLANTABLE CARDIOVERTER DEFIBRILLATOR  (CRT-D);  Surgeon: [name], MD;  Location[facility] C[facility] LAB;  Service: Cardiovascular;  Laterality: N/A;  . CARDIAC CATHETERIZATION  12/10/2011   SVG to OM widely patent.  LIMA to LAD patent  . CATARACT EXTRACTION W/PHACO Right 10/12/2017   Procedure: CATARACT EXTRACTION PHACO AND  INTRAOCULAR LENS PLACEMENT (IOC);  Surgeon: Birder Robson, MD;  Location: [facility] ORS;  Service: Ophthalmology;  Laterality: Right;  [place] 00:57 AP% 15.9 CDE 9.07 Fluid pack lot # [id number]  . COLONOSCOPY N/A 07/13/2018   Procedure: COLONOSCOPY;  Surgeon: Toledo, Benay Pike, MD;  Location: [facility] ENDOSCOPY;  Service: Gastroenterology;  Laterality: N/A;  . CORONARY ARTERY BYPASS GRAFT  1986  . ENTEROSCOPY N/A 09/14/2018   Procedure: ENTEROSCOPY;  Surgeon: Toledo, Benay Pike, MD;   Location: [facility] ENDOSCOPY;  Service: Gastroenterology;  Laterality: N/A;  symptomatic anemia, GI blood loss anemia, melena, positive small bowel capsule endoscopy showing source of bleeding   . ESOPHAGOGASTRODUODENOSCOPY N/A 07/13/2018   Procedure: ESOPHAGOGASTRODUODENOSCOPY (EGD);  Surgeon: Toledo, Benay Pike, MD;  Location: [facility] ENDOSCOPY;  Service: Gastroenterology;  Laterality: N/A;  . ESOPHAGOGASTRODUODENOSCOPY N/A 09/14/2018   Procedure: ESOPHAGOGASTRODUODENOSCOPY (EGD);  Surgeon: Toledo, Benay Pike, MD;  Location: [facility] ENDOSCOPY;  Service: Gastroenterology;  Laterality: N/A;  SIGN LANAGUAGE INTERPRETER  . ESOPHAGOGASTRODUODENOSCOPY N/A 06/21/2019   Procedure: ESOPHAGOGASTRODUODENOSCOPY (EGD);  Surgeon: Toledo, Benay Pike, MD;  Location: [facility] ENDOSCOPY;  Service: Gastroenterology;  Laterality: N/A;  . ESOPHAGOGASTRODUODENOSCOPY (EGD) WITH PROPOFOL N/A 05/27/2018   Procedure: ESOPHAGOGASTRODUODENOSCOPY (EGD) WITH PROPOFOL;  Surgeon: Clarene Essex, MD;  Location: [place];  Service: Endoscopy;  Laterality: N/A;  . INSERT / REPLACE / REMOVE PACEMAKER    . LEFT HEART CATHETERIZATION WITH CORONARY/GRAFT ANGIOGRAM N/A 12/10/2011   Procedure: LEFT HEART CATHETERIZATION WITH Beatrix Fetters;  Surgeon: Sanda Klein, MD;  Location: [facility] CATH LAB;  Service: Cardiovascular;  Laterality: N/A;  . LEFT HEART CATHETERIZATION WITH CORONARY/GRAFT ANGIOGRAM N/A 09/25/2013   Procedure: LEFT HEART CATHETERIZATION WITH Beatrix Fetters;  Surgeon: Blane Ohara, MD;  Location: [facility] CATH LAB;  Service: Cardiovascular;  Laterality: N/A;  . Persantine Myoview  05/06/2010   Post-rest ejection fraction 30%. No significant ischemia demonstrated. Compared to previous study there is no significant change.  . TEE WITHOUT CARDIOVERSION N/A 06/22/2019   Procedure: TRANSESOPHAGEAL ECHOCARDIOGRAM (TEE);  Surgeon: Minna Merritts, MD;  Location: [facility] ORS;  Service: Cardiovascular;  Laterality: N/A;  . TRANSURETHRAL  RESECTION OF PROSTATE     s/p    Social History   Socioeconomic History  . Marital status: Married    Spouse name: Not on file  . Number of children: Not on file  . Years of education: Not on file  . Highest education level: Not on file  Occupational History  . Not on file  Tobacco Use  . Smoking status: Former Smoker    Quit date: 03/15/1985    Years since quitting: 34.4  . Smokeless tobacco: Never Used  Substance and Sexual Activity  . Alcohol use: No    Comment: occas.  . Drug use: No  . Sexual activity: Yes  Other Topics Concern  . Not on file  Social History Narrative  . Not on file   Social Determinants of Health   Financial Resource Strain: Low Risk   . Difficulty of Paying Living Expenses: Not hard at all  Food Insecurity: No Food Insecurity  . Worried About Charity fundraiser in the Last Year: Never true  . Ran Out of Food in the Last Year: Never true  Transportation Needs: No Transportation Needs  . Lack of Transportation (Medical): No  . Lack of Transportation (Non-Medical): No  Physical Activity: Inactive  . Days of Exercise per Week: 0 days  . Minutes of Exercise per Session: 0 min  Stress: No Stress Concern Present  . Feeling  of Stress : Not at all  Social Connections: Slightly Isolated  . Frequency of Communication with Friends and Family: Never  . Frequency of Social Gatherings with Friends and Family: Never  . Attends Religious Services: 1 to 4 times per year  . Active Member of Clubs or Organizations: Yes  . Attends Archivist Meetings: 1 to 4 times per year  . Marital Status: Married  Human resources officer Violence: Not At Risk  . Fear of Current or Ex-Partner: No  . Emotionally Abused: No  . Physically Abused: No  . Sexually Abused: No    Family History  Problem Relation Age of Onset  . Hypertension Father    Allergies  Allergen Reactions  . Entresto [Sacubitril-Valsartan] Swelling    And bruising of arm  . Phenazopyridine  Nausea Only and Other (See Comments)    GI UPSET  . Ramipril Other (See Comments)    unk Other reaction(s): Other (See Comments), Unknown unk   ? Current Facility-Administered Medications  Medication Dose Route Frequency Provider Last Rate Last Admin  . 0.9 %  sodium chloride infusion   Intravenous Continuous Awilda Bill, NP 50 mL/hr at 08/20/19 1342 New Bag at 08/20/19 1342  . 0.9 %  sodium chloride infusion  250 mL Intravenous Continuous Awilda Bill, NP   Stopped at 08/19/19 0023  . acetaminophen (TYLENOL) tablet 650 mg  650 mg Oral Q6H PRN Ivor Costa, MD   650 mg at 08/18/19 1902  . albuterol (PROVENTIL) (2.5 MG/3ML) 0.083% nebulizer solution 3 mL  3 mL Inhalation Q4H PRN Ivor Costa, MD      . cefTRIAXone (ROCEPHIN) 2 g in sodium chloride 0.9 % 100 mL IVPB  2 g Intravenous Q24H Awilda Bill, NP 200 mL/hr at 08/21/19 0244 2 g at 08/21/19 0244  . Chlorhexidine Gluconate Cloth 2 % PADS 6 each  6 each Topical Daily Awilda Bill, NP   6 each at 08/21/19 1054  . dextromethorphan-guaiFENesin (MUCINEX DM) 30-600 MG per 12 hr tablet 1 tablet  1 tablet Oral BID PRN Ivor Costa, MD      . ferrous sulfate tablet 325 mg  325 mg Oral Daily Ivor Costa, MD   325 mg at 08/21/19 1053  . multivitamin with minerals tablet 1 tablet  1 tablet Oral Daily Ivor Costa, MD   1 tablet at 08/21/19 1053  . ondansetron (ZOFRAN) injection 4 mg  4 mg Intravenous Q8H PRN Ivor Costa, MD      . pantoprazole (PROTONIX) injection 40 mg  40 mg Intravenous Q12H Ivor Costa, MD   40 mg at 08/21/19 1054  . simvastatin (ZOCOR) tablet 20 mg  20 mg Oral Daily Ivor Costa, MD   20 mg at 08/20/19 1806     Abtx:  Anti-infectives (From admission, onward)   Start     Dose/Rate Route Frequency Ordered Stop   08/19/19 1800  vancomycin (VANCOREADY) IVPB 750 mg/150 mL  Status:  Discontinued     750 mg 150 mL/hr over 60 Minutes Intravenous Every 24 hours 08/18/19 1538 08/19/19 0128   08/19/19 0200  penicillin G potassium 3  Million Units in dextrose 5 % 100 mL IVPB  Status:  Discontinued     3 Million Units 200 mL/hr over 30 Minutes Intravenous Every 4 hours 08/19/19 0138 08/19/19 0142   08/19/19 0145  cefTRIAXone (ROCEPHIN) 2 g in sodium chloride 0.9 % 100 mL IVPB     2 g 200 mL/hr over 30 Minutes Intravenous  Every 24 hours 08/19/19 0142     08/19/19 0130  penicillin G potassium 3 Million Units in dextrose 68mL IVPB  Status:  Discontinued     3 Million Units 100 mL/hr over 30 Minutes Intravenous Every 4 hours 08/19/19 0128 08/19/19 0138   08/18/19 1800  ceFEPIme (MAXIPIME) 2 g in sodium chloride 0.9 % 100 mL IVPB  Status:  Discontinued     2 g 200 mL/hr over 30 Minutes Intravenous Every 24 hours 08/18/19 1538 08/19/19 0128   08/18/19 1600  vancomycin (VANCOREADY) IVPB 1500 mg/300 mL     1,500 mg 150 mL/hr over 120 Minutes Intravenous  Once 08/18/19 1538 08/18/19 1933   08/18/19 1415  cefTRIAXone (ROCEPHIN) 2 g in sodium chloride 0.9 % 100 mL IVPB  Status:  Discontinued     2 g 200 mL/hr over 30 Minutes Intravenous Every 24 hours 08/18/19 1408 08/18/19 1538   08/18/19 1415  azithromycin (ZITHROMAX) 500 mg in sodium chloride 0.9 % 250 mL IVPB  Status:  Discontinued     500 mg 250 mL/hr over 60 Minutes Intravenous Every 24 hours 08/18/19 1408 08/19/19 0128      REVIEW OF SYSTEMS:  Const:  fever, negative chills, negative weight loss Eyes: negative diplopia or visual changes, negative eye pain ENT: negative coryza, negative sore throat Resp: negative cough, hemoptysis, has dyspnea Cards: negative for chest pain, palpitations, has lower extremity edema GU: negative for frequency, dysuria and hematuria GI: Dark stools, diarrhea Skin: negative for rash and pruritus Heme: negative for easy bruising and gum/nose bleeding MS: Generalized weakness Neurolo:negative for headaches, dizziness, vertigo, memory problems  Psych: negative for feelings of anxiety, depression  Endocrine: negative for thyroid,  diabetes Allergy/Immunology- as above: Objective:  VITALS:  BP 116/68   Pulse 60   Temp (!) 97.4 F (36.3 C) (Oral)   Resp (!) 23   Ht 6\' 2"  (1.88 m)   Wt 92.2 kg   SpO2 97%   BMI 26.10 kg/m  PHYSICAL EXAM:  General: Alert, cooperative, no distress, appears stated age.  Head: Normocephalic, without obvious abnormality, atraumatic. Eyes: Conjunctivae clear, anicteric sclerae. Pupils are equal ENT Nares normal. No drainage or sinus tenderness. Lips, mucosa, and tongue normal. No Thrush Neck: Supple, symmetrical, no adenopathy, thyroid: non tender no carotid bruit and no JVD. Back: Did not examine Lungs: Bilateral air entry.  Reduced on the left side base.  Crypts bilateral. Heart: Regular rate and rhythm, no murmur, rub or gallop. Sternal scar ICD site fine Abdomen: Soft, non-tender,not distended. Bowel sounds normal. No masses Extremities: Bilateral leg edema Skin: Some bruising Lymph: Cervical, supraclavicular normal. Neurologic: Grossly non-focal Pertinent Labs Lab Results CBC    Component Value Date/Time   WBC 9.2 08/20/2019 0446   RBC 3.43 (L) 08/20/2019 0446   HGB 8.8 (L) 08/21/2019 0504   HGB 14.3 02/14/2014 1027   HCT 26.5 (L) 08/20/2019 0446   HCT 43.3 02/14/2014 0546   PLT 131 (L) 08/20/2019 0446   PLT 154 02/14/2014 0546   MCV 77.3 (L) 08/20/2019 0446   MCV 95 02/14/2014 0546   MCH 25.4 (L) 08/20/2019 0446   MCHC 32.8 08/20/2019 0446   RDW 18.6 (H) 08/20/2019 0446   RDW 14.1 02/14/2014 0546   LYMPHSABS 0.6 (L) 08/18/2019 1353   LYMPHSABS 1.6 09/14/2012 2047   MONOABS 1.0 08/18/2019 1353   MONOABS 1.3 (H) 09/14/2012 2047   EOSABS 0.0 08/18/2019 1353   EOSABS 0.2 09/14/2012 2047   BASOSABS 0.0 08/18/2019 1353  BASOSABS 0.1 09/14/2012 2047    CMP Latest Ref Rng & Units 08/21/2019 08/20/2019 08/20/2019  Glucose 70 - 99 mg/dL 103(H) - 128(H)  BUN 8 - 23 mg/dL 48(H) - 67(H)  Creatinine 0.61 - 1.24 mg/dL 1.42(H) - 1.95(H)  Sodium 135 - 145 mmol/L 141 -  139  Potassium 3.5 - 5.1 mmol/L 3.3(L) 3.0(L) 2.9(L)  Chloride 98 - 111 mmol/L 108 - 102  CO2 22 - 32 mmol/L 28 - 27  Calcium 8.9 - 10.3 mg/dL 7.8(L) - 7.5(L)  Total Protein 6.5 - 8.1 g/dL - - -  Total Bilirubin 0.3 - 1.2 mg/dL - - -  Alkaline Phos 38 - 126 U/L - - -  AST 15 - 41 U/L - - -  ALT 0 - 44 U/L - - -      Microbiology: Recent Results (from the past 240 hour(s))  Blood Culture (routine x 2)     Status: Abnormal   Collection Time: 08/18/19  2:34 PM   Specimen: BLOOD  Result Value Ref Range Status   Specimen Description   Final    BLOOD BLOOD RIGHT HAND Performed at [facility], [address], [place], [place] [address]    Special Requests   Final    BOTTLES DRAWN AEROBIC AND ANAEROBIC Blood Culture adequate volume Performed at [facility], [address], [place], [place] [address]    Culture  Setup Time   Final    GRAM POSITIVE COCCI IN BOTH AEROBIC AND ANAEROBIC BOTTLES CRITICAL RESULT CALLED TO, READ BACK BY AND VERIFIED WITH: DAVID BESANTI 08/19/19 AT 0050 HS Performed at [facility], [place] [address], [place], [place] [address]    Culture GROUP B STREP(S.AGALACTIAE)ISOLATED (A)  Final   Report Status 08/21/2019 FINAL  Final   Organism ID, Bacteria GROUP B STREP(S.AGALACTIAE)ISOLATED  Final      Susceptibility   Group b strep(s.agalactiae)isolated - MIC*    CLINDAMYCIN >=1 RESISTANT Resistant     AMPICILLIN <=0.25 SENSITIVE Sensitive     ERYTHROMYCIN >=8 RESISTANT Resistant     VANCOMYCIN 0.5 SENSITIVE Sensitive     CEFTRIAXONE <=0.12 SENSITIVE Sensitive     LEVOFLOXACIN 1 SENSITIVE Sensitive     PENICILLIN Value in next row Sensitive      SENSITIVE0.06    * GROUP B STREP(S.AGALACTIAE)ISOLATED  Blood Culture ID Panel (Reflexed)     Status: Abnormal   Collection Time: 08/18/19  2:34 PM  Result Value Ref Range Status   Enterococcus species NOT DETECTED NOT DETECTED Final   Listeria monocytogenes NOT DETECTED NOT DETECTED Final    Staphylococcus species NOT DETECTED NOT DETECTED Final   Staphylococcus aureus (BCID) NOT DETECTED NOT DETECTED Final   Streptococcus species DETECTED (A) NOT DETECTED Final    Comment: CRITICAL RESULT CALLED TO, READ BACK BY AND VERIFIED WITH: DAVID BESANTI 08/19/19 AT 0050 HS    Streptococcus agalactiae DETECTED (A) NOT DETECTED Final    Comment: CRITICAL RESULT CALLED TO, READ BACK BY AND VERIFIED WITH: DAVID BESANTI 08/19/19 AT 0050 HS    Streptococcus pneumoniae NOT DETECTED NOT DETECTED Final   Streptococcus pyogenes NOT DETECTED NOT DETECTED Final   Acinetobacter baumannii NOT DETECTED NOT DETECTED Final   Enterobacteriaceae species NOT DETECTED NOT DETECTED Final   Enterobacter cloacae complex NOT DETECTED NOT DETECTED Final   Escherichia coli NOT DETECTED NOT DETECTED Final   Klebsiella oxytoca NOT DETECTED NOT DETECTED Final   Klebsiella pneumoniae NOT DETECTED NOT DETECTED Final   Proteus  species NOT DETECTED NOT DETECTED Final   Serratia marcescens NOT DETECTED NOT DETECTED Final   Haemophilus influenzae NOT DETECTED NOT DETECTED Final   Neisseria meningitidis NOT DETECTED NOT DETECTED Final   Pseudomonas aeruginosa NOT DETECTED NOT DETECTED Final   Candida albicans NOT DETECTED NOT DETECTED Final   Candida glabrata NOT DETECTED NOT DETECTED Final   Candida krusei NOT DETECTED NOT DETECTED Final   Candida parapsilosis NOT DETECTED NOT DETECTED Final   Candida tropicalis NOT DETECTED NOT DETECTED Final    Comment: Performed at [facility], [address] [place], [place] [address]  Blood Culture (routine x 2)     Status: Abnormal   Collection Time: 08/18/19  2:44 PM   Specimen: BLOOD  Result Value Ref Range Status   Specimen Description   Final    BLOOD RIGHT ANTECUBITAL Performed at [facility], [address] [place], [place] [address]    Special Requests   Final    BOTTLES DRAWN AEROBIC AND ANAEROBIC Blood Culture adequate volume Performed  at [facility], [address] [place], [place] [address]    Culture  Setup Time   Final    GRAM POSITIVE COCCI IN BOTH AEROBIC AND ANAEROBIC BOTTLES CRITICAL VALUE NOTED.  VALUE IS CONSISTENT WITH PREVIOUSLY REPORTED AND CALLED VALUE. Performed at [facility], [address] [place], [place] [address]    Culture (A)  Final    GROUP B STREP(S.AGALACTIAE)ISOLATED SUSCEPTIBILITIES PERFORMED ON PREVIOUS CULTURE WITHIN THE LAST 5 DAYS. Performed at [facility], [place] [address], [place], [place] [address]    Report Status 08/21/2019 FINAL  Final  SARS Coronavirus 2 by RT PCR (hospital order, performed in [facility] hospital lab) Nasopharyngeal Nasopharyngeal Swab     Status: None   Collection Time: 08/18/19  2:50 PM   Specimen: Nasopharyngeal Swab  Result Value Ref Range Status   SARS Coronavirus 2 NEGATIVE NEGATIVE Final    Comment: (NOTE) SARS-CoV-2 target nucleic acids are NOT DETECTED. The SARS-CoV-2 RNA is generally detectable in upper and lower respiratory specimens during the acute phase of infection. The lowest concentration of SARS-CoV-2 viral copies this assay can detect is 250 copies / mL. A negative result does not preclude SARS-CoV-2 infection and should not be used as the sole basis for treatment or other patient management decisions.  A negative result may occur with improper specimen collection / handling, submission of specimen other than nasopharyngeal swab, presence of viral mutation(s) within the areas targeted by this assay, and inadequate number of viral copies (<250 copies / mL). A negative result must be combined with clinical observations, patient history, and epidemiological information. Fact Sheet for Patients:   StrictlyIdeas.no Fact Sheet for Healthcare Providers: BankingDealers.co.za This test is not yet approved or cleared  by the [place] FDA and has been authorized for  detection and/or diagnosis of SARS-CoV-2 by FDA under an Emergency Use Authorization (EUA).  This EUA will remain in effect (meaning this test can be used) for the duration of the COVID-19 declaration under Section 564(b)(1) of the Act, 21 U.S.C. section 360bbb-3(b)(1), unless the authorization is terminated or revoked sooner. Performed at [facility], [address] [place], [place] [address]   Culture, sputum-assessment     Status: None   Collection Time: 08/19/19  1:11 AM   Specimen: Sputum  Result Value Ref Range Status   Specimen Description SPUTUM  Final   Special Requests NONE  Final   Sputum evaluation  Final    Sputum specimen not acceptable for testing.  Please recollect.   C/CHARLIE FLEETWOOD AT 8921 08/19/19.PMF Performed at [facility], [address] [place], [place] [address]    Report Status 08/19/2019 FINAL  Final  MRSA PCR Screening     Status: None   Collection Time: 08/19/19  1:30 AM   Specimen: Nasal Mucosa; Nasopharyngeal  Result Value Ref Range Status   MRSA by PCR NEGATIVE NEGATIVE Final    Comment:        The GeneXpert MRSA Assay (FDA approved for NASAL specimens only), is one component of a comprehensive MRSA colonization surveillance program. It is not intended to diagnose MRSA infection nor to guide or monitor treatment for MRSA infections. Performed at [facility], [address] [place], [place] [address]   Gastrointestinal Panel by PCR , Stool     Status: None   Collection Time: 08/19/19 12:56 PM   Specimen: Stool  Result Value Ref Range Status   Campylobacter species NOT DETECTED NOT DETECTED Final   Plesimonas shigelloides NOT DETECTED NOT DETECTED Final   Salmonella species NOT DETECTED NOT DETECTED Final   Yersinia enterocolitica NOT DETECTED NOT DETECTED Final   Vibrio species NOT DETECTED NOT DETECTED Final   Vibrio cholerae NOT DETECTED NOT DETECTED Final   Enteroaggregative E coli (EAEC) NOT  DETECTED NOT DETECTED Final   Enteropathogenic E coli (EPEC) NOT DETECTED NOT DETECTED Final   Enterotoxigenic E coli (ETEC) NOT DETECTED NOT DETECTED Final   Shiga like toxin producing E coli (STEC) NOT DETECTED NOT DETECTED Final   Shigella/Enteroinvasive E coli (EIEC) NOT DETECTED NOT DETECTED Final   Cryptosporidium NOT DETECTED NOT DETECTED Final   Cyclospora cayetanensis NOT DETECTED NOT DETECTED Final   Entamoeba histolytica NOT DETECTED NOT DETECTED Final   Giardia lamblia NOT DETECTED NOT DETECTED Final   Adenovirus F40/41 NOT DETECTED NOT DETECTED Final   Astrovirus NOT DETECTED NOT DETECTED Final   Norovirus GI/GII NOT DETECTED NOT DETECTED Final   Rotavirus A NOT DETECTED NOT DETECTED Final   Sapovirus (I, II, IV, and V) NOT DETECTED NOT DETECTED Final    Comment: Performed at [facility], [address] [place], [place] [address]  C Difficile Quick Screen w PCR reflex     Status: None   Collection Time: 08/20/19 12:01 PM   Specimen: STOOL  Result Value Ref Range Status   C Diff antigen NEGATIVE NEGATIVE Final   C Diff toxin NEGATIVE NEGATIVE Final   C Diff interpretation No C. difficile detected.  Final    Comment: Performed at [facility], [address] [place], [place] [address]  CULTURE, BLOOD (ROUTINE X 2) w Reflex to ID Panel     Status: None (Preliminary result)   Collection Time: 08/20/19  1:07 PM   Specimen: BLOOD  Result Value Ref Range Status   Specimen Description BLOOD [facility]  Final   Special Requests   Final    BOTTLES DRAWN AEROBIC AND ANAEROBIC Blood Culture adequate volume   Culture   Final    NO GROWTH < 24 HOURS Performed at [facility], [address] [place], [place] [address]    Report Status PENDING  Incomplete  CULTURE, BLOOD (ROUTINE X 2) w Reflex to ID Panel     Status: None (Preliminary result)   Collection Time: 08/20/19  1:22 PM   Specimen: BLOOD  Result Value Ref Range Status   Specimen Description  BLOOD [facility]  Final  Special Requests   Final    BOTTLES DRAWN AEROBIC AND ANAEROBIC Blood Culture adequate volume   Culture   Final    NO GROWTH < 24 HOURS Performed at [facility], [address] [place], [place] [address]    Report Status PENDING  Incomplete    IMAGING RESULTS:  I have personally reviewed the films Cardiomegaly, bilateral pleural effusion, possible left lower lobe consolidation ? 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  He has AICD and recently was treated with 6 4 weeks of IV antibiotics for the same after negative TEE in April 2021.  Very likely the ICD leads are infected.  Need to discuss with cardiology as the ICD will have to be removed. ? __Having biventricular heart failure and  advanced age this may not be easy. Continue IV ceftriaxone Not sure whether he has a left lower lobe pneumonia versus pleural effusion  Anemia.  He has positive fecal occult blood.  Congestive heart failure: EF 35% AICD in place  Coronary artery disease status post CABG  History of CA prostate: History of transurethral resection _________________________________________________ Discussed with patient, and his nurse Note:  This document was prepared using Dragon voice recognition software and may include unintentional dictation errors.

## 2019-08-21 NOTE — Progress Notes (Signed)
PROGRESS NOTE    Thomas [name]  IOE:[id number] DOB: [birth year] DOA: 08/18/2019 PCP: [name], MD    Chief complaint. Follow-up on bacteremia.  Brief Narrative: Patient is 85 year old male with history of essential hypertension, history of stroke, coronary disease, paroxysmal atrial fibrillation not on anticoagulation, AICD placement, prostate cancer, chronic systolic congestive heart failure, history of GI bleed who presents to the hospital with a fever, chills, cough and shortness of breath.  He was admitted to the hospital on 6/4 with diagnosis of left basilar pneumonia.  Overnight, patient developed significant hypotension, he was transferred to ICU for pressor.  Patient also has black tarry stool with a positive occult blood.  Has been seen by GI, planning for small bowel enteroscopy. Patient also has a positive blood culture with group B streptococcus.  6/6.  Patient is off pressor.  Antibiotics is continued.  Patient to be transferred to medical floor.    Assessment & Plan:   Principal Problem:   HCAP (healthcare-associated pneumonia) Active Problems:   CAD Status post coronary artery bypass grafting: 1995   Permanent atrial fibrillation ([place])   Benign essential HTN   Acid reflux   HLD (hyperlipidemia)   Stroke (cerebrum) (HCC)   Chronic systolic CHF (congestive heart failure) (HCC)   Iron deficiency anemia due to chronic blood loss   Severe sepsis with septic shock (HCC)   Acute renal failure superimposed on stage 3a chronic kidney disease (HCC)   Black stool   Iron deficiency anemia   Septicemia due to group B Streptococcus (HCC)   Acute blood loss anemia  #1. Septic shock secondary to group B streptococcus septicemia. Secondary to healthcare associated pneumonia. Continue Rocephin 2 g every 24 hours. Repeated blood cultures overnight no growth. TTE performed, pending results. Will perform TEE if TTE is negative for endocarditis. ID consulted for selection of  antibiotics.  2. Acute blood loss anemia with iron deficient anemia. Seen by GI. Pending further work-up. Hemoglobin has been stable. Patient still has some loose stools, C. difficile toxin is negative. Patient received IV iron. Continue IV Lasix.  #3. Chronic systolic congestive heart failure. No evidence of exacerbation.  4. Acute renal failure superimposed stage IIIa chronic kidney disease. Renal function continued to improve.  5. Hypokalemia. Continue oral supplement. Check another BMP tomorrow.  6. Essential hypertension. Continue to follow.  7. History of stroke.    DVT prophylaxis: SCDs Code Status: Full Family Communication: None Disposition Plan:   Patient came from: Home                                                                                                                           Anticipated d/c place: Home  Barriers to d/c OR conditions which need to be met to effect a safe d/c:   Consultants:   Critical care  Gastroenterology.  Procedures: None Antimicrobials:  Rocephin.    Subjective: Patient interviewed with the presence of sign language interpreter. Patient doing well  today. He denies any short of breath or cough. He still has some loose stools, but has been better than yesterday. No nausea vomiting or abdominal pain. No fever or chills.  Objective: Vitals:   08/21/19 0800 08/21/19 0900 08/21/19 1000 08/21/19 1200  BP: (!) 121/57 122/81 (!) 120/56 116/68  Pulse: 63 (!) 35 66 60  Resp: (!) 23 (!) 30 (!) 24 (!) 23  Temp: (!) 97.4 F (36.3 C)     TempSrc: Oral     SpO2: 98% 97% 97% 97%  Weight:      Height:        Intake/Output Summary (Last 24 hours) at 08/21/2019 1245 Last data filed at 08/21/2019 0800 Gross per 24 hour  Intake 1640 ml  Output 900 ml  Net 740 ml   Filed Weights   08/18/19 1346 08/19/19 0224  Weight: 89.5 kg 92.2 kg    Examination:  General exam: Appears calm and comfortable  Respiratory system:  Clear to auscultation. Respiratory effort normal. Cardiovascular system: S1 & S2 heard, RRR. No JVD, murmurs, rubs, gallops or clicks. No pedal edema. Gastrointestinal system: Abdomen is nondistended, soft and nontender. No organomegaly or masses felt. Normal bowel sounds heard. Central nervous system: Alert and oriented. No focal neurological deficits. Extremities: Symmetric  Skin: No rashes, lesions or ulcers Psychiatry:  Mood & affect appropriate.     Data Reviewed: I have personally reviewed following labs and imaging studies  CBC: Recent Labs  Lab 08/18/19 1353 08/18/19 2029 08/19/19 0227 08/19/19 0227 08/19/19 0650 08/19/19 0650 08/19/19 1206 08/19/19 1206 08/19/19 2244 08/20/19 0446 08/20/19 1322 08/20/19 2047 08/21/19 0504  WBC 10.9*   < > 15.3*  --  14.4*  --  12.1*  --  9.4 9.2  --   --   --   NEUTROABS 9.4*  --   --   --   --   --   --   --   --   --   --   --   --   HGB 7.4*   < > 7.3*   < > 7.2*   < > 6.7*   < > 8.5* 8.7* 9.5* 9.1* 8.8*  HCT 24.4*   < > 23.5*  --  23.9*  --  22.2*  --  26.9* 26.5*  --   --   --   MCV 78.5*   < > 76.5*  --  79.7*  --  79.3*  --  78.4* 77.3*  --   --   --   PLT 181   < > 160  --  170  --  144*  --  142* 131*  --   --   --    < > = values in this interval not displayed.   Basic Metabolic Panel: Recent Labs  Lab 08/18/19 1353 08/18/19 2351 08/19/19 0227 08/20/19 0446 08/20/19 2119 08/21/19 0504  NA 139  --  139 139  --  141  K 3.5  --  3.5 2.9* 3.0* 3.3*  CL 99  --  103 102  --  108  CO2 28  --  27 27  --  28  GLUCOSE 115*  --  139* 128*  --  103*  BUN 57*  --  67* 67*  --  48*  CREATININE 2.34*  --  2.57* 1.95*  --  1.42*  CALCIUM 7.9*  --  7.4* 7.5*  --  7.8*  MG  --  2.0  --   --   --  2.2   GFR: Estimated Creatinine Clearance: 46.6 mL/min (A) (by C-G formula based on SCr of 1.42 mg/dL (H)). Liver Function Tests: Recent Labs  Lab 08/18/19 1353  AST 46*  ALT 18  ALKPHOS 87  BILITOT 1.1  PROT 4.9*  ALBUMIN  1.9*   No results for input(s): LIPASE, AMYLASE in the last 168 hours. No results for input(s): AMMONIA in the last 168 hours. Coagulation Profile: Recent Labs  Lab 08/18/19 2029  INR 1.6*   Cardiac Enzymes: No results for input(s): CKTOTAL, CKMB, CKMBINDEX, TROPONINI in the last 168 hours. BNP (last 3 results) No results for input(s): PROBNP in the last 8760 hours. HbA1C: No results for input(s): HGBA1C in the last 72 hours. CBG: No results for input(s): GLUCAP in the last 168 hours. Lipid Profile: No results for input(s): CHOL, HDL, LDLCALC, TRIG, CHOLHDL, LDLDIRECT in the last 72 hours. Thyroid Function Tests: No results for input(s): TSH, T4TOTAL, FREET4, T3FREE, THYROIDAB in the last 72 hours. Anemia Panel: Recent Labs    08/20/19 0446  FERRITIN 87  TIBC 155*  IRON 11*   Sepsis Labs: Recent Labs  Lab 08/18/19 1353 08/18/19 1353 08/18/19 1703 08/18/19 2029 08/18/19 2351 08/19/19 0650  PROCALCITON 52.90  --   --   --   --   --   LATICACIDVEN 2.3*   < > 3.6* 2.3* 2.2* 1.4   < > = values in this interval not displayed.    Recent Results (from the past 240 hour(s))  Blood Culture (routine x 2)     Status: Abnormal   Collection Time: 08/18/19  2:34 PM   Specimen: BLOOD  Result Value Ref Range Status   Specimen Description   Final    BLOOD BLOOD RIGHT HAND Performed at [facility], [address], [place], [place] [address]    Special Requests   Final    BOTTLES DRAWN AEROBIC AND ANAEROBIC Blood Culture adequate volume Performed at [facility], [address], [place], [place] [address]    Culture  Setup Time   Final    GRAM POSITIVE COCCI IN BOTH AEROBIC AND ANAEROBIC BOTTLES CRITICAL RESULT CALLED TO, READ BACK BY AND VERIFIED WITH: DAVID BESANTI 08/19/19 AT 0050 HS Performed at [facility], [place] [address], [place], [place] [address]    Culture GROUP B STREP(S.AGALACTIAE)ISOLATED (A)  Final   Report Status 08/21/2019 FINAL   Final   Organism ID, Bacteria GROUP B STREP(S.AGALACTIAE)ISOLATED  Final      Susceptibility   Group b strep(s.agalactiae)isolated - MIC*    CLINDAMYCIN >=1 RESISTANT Resistant     AMPICILLIN <=0.25 SENSITIVE Sensitive     ERYTHROMYCIN >=8 RESISTANT Resistant     VANCOMYCIN 0.5 SENSITIVE Sensitive     CEFTRIAXONE <=0.12 SENSITIVE Sensitive     LEVOFLOXACIN 1 SENSITIVE Sensitive     PENICILLIN Value in next row Sensitive      SENSITIVE0.06    * GROUP B STREP(S.AGALACTIAE)ISOLATED  Blood Culture ID Panel (Reflexed)     Status: Abnormal   Collection Time: 08/18/19  2:34 PM  Result Value Ref Range Status   Enterococcus species NOT DETECTED NOT DETECTED Final   Listeria monocytogenes NOT DETECTED NOT DETECTED Final   Staphylococcus species NOT DETECTED NOT DETECTED Final   Staphylococcus aureus (BCID) NOT DETECTED NOT DETECTED Final   Streptococcus species DETECTED (A) NOT DETECTED Final    Comment: CRITICAL RESULT CALLED TO, READ BACK BY AND VERIFIED WITH: DAVID BESANTI 08/19/19 AT 0050 HS  Streptococcus agalactiae DETECTED (A) NOT DETECTED Final    Comment: CRITICAL RESULT CALLED TO, READ BACK BY AND VERIFIED WITH: DAVID BESANTI 08/19/19 AT 0050 HS    Streptococcus pneumoniae NOT DETECTED NOT DETECTED Final   Streptococcus pyogenes NOT DETECTED NOT DETECTED Final   Acinetobacter baumannii NOT DETECTED NOT DETECTED Final   Enterobacteriaceae species NOT DETECTED NOT DETECTED Final   Enterobacter cloacae complex NOT DETECTED NOT DETECTED Final   Escherichia coli NOT DETECTED NOT DETECTED Final   Klebsiella oxytoca NOT DETECTED NOT DETECTED Final   Klebsiella pneumoniae NOT DETECTED NOT DETECTED Final   Proteus species NOT DETECTED NOT DETECTED Final   Serratia marcescens NOT DETECTED NOT DETECTED Final   Haemophilus influenzae NOT DETECTED NOT DETECTED Final   Neisseria meningitidis NOT DETECTED NOT DETECTED Final   Pseudomonas aeruginosa NOT DETECTED NOT DETECTED Final   Candida  albicans NOT DETECTED NOT DETECTED Final   Candida glabrata NOT DETECTED NOT DETECTED Final   Candida krusei NOT DETECTED NOT DETECTED Final   Candida parapsilosis NOT DETECTED NOT DETECTED Final   Candida tropicalis NOT DETECTED NOT DETECTED Final    Comment: Performed at [facility], [address] [place], [place] [address]  Blood Culture (routine x 2)     Status: Abnormal   Collection Time: 08/18/19  2:44 PM   Specimen: BLOOD  Result Value Ref Range Status   Specimen Description   Final    BLOOD RIGHT ANTECUBITAL Performed at [facility], [address] [place], [place] [address]    Special Requests   Final    BOTTLES DRAWN AEROBIC AND ANAEROBIC Blood Culture adequate volume Performed at [facility], [address] [place], [place] [address]    Culture  Setup Time   Final    GRAM POSITIVE COCCI IN BOTH AEROBIC AND ANAEROBIC BOTTLES CRITICAL VALUE NOTED.  VALUE IS CONSISTENT WITH PREVIOUSLY REPORTED AND CALLED VALUE. Performed at [facility], [address] [place], [place] [address]    Culture (A)  Final    GROUP B STREP(S.AGALACTIAE)ISOLATED SUSCEPTIBILITIES PERFORMED ON PREVIOUS CULTURE WITHIN THE LAST 5 DAYS. Performed at [facility], [place] [address], [place], [place] [address]    Report Status 08/21/2019 FINAL  Final  SARS Coronavirus 2 by RT PCR (hospital order, performed in [facility] hospital lab) Nasopharyngeal Nasopharyngeal Swab     Status: None   Collection Time: 08/18/19  2:50 PM   Specimen: Nasopharyngeal Swab  Result Value Ref Range Status   SARS Coronavirus 2 NEGATIVE NEGATIVE Final    Comment: (NOTE) SARS-CoV-2 target nucleic acids are NOT DETECTED. The SARS-CoV-2 RNA is generally detectable in upper and lower respiratory specimens during the acute phase of infection. The lowest concentration of SARS-CoV-2 viral copies this assay can detect is 250 copies / mL. A negative result does not preclude  SARS-CoV-2 infection and should not be used as the sole basis for treatment or other patient management decisions.  A negative result may occur with improper specimen collection / handling, submission of specimen other than nasopharyngeal swab, presence of viral mutation(s) within the areas targeted by this assay, and inadequate number of viral copies (<250 copies / mL). A negative result must be combined with clinical observations, patient history, and epidemiological information. Fact Sheet for Patients:   StrictlyIdeas.no Fact Sheet for Healthcare Providers: BankingDealers.co.za This test is not yet approved or cleared  by the [place] FDA and has been authorized for detection and/or diagnosis of SARS-CoV-2  by FDA under an Emergency Use Authorization (EUA).  This EUA will remain in effect (meaning this test can be used) for the duration of the COVID-19 declaration under Section 564(b)(1) of the Act, 21 U.S.C. section 360bbb-3(b)(1), unless the authorization is terminated or revoked sooner. Performed at [facility], [address] [place], [place] [address]   Culture, sputum-assessment     Status: None   Collection Time: 08/19/19  1:11 AM   Specimen: Sputum  Result Value Ref Range Status   Specimen Description SPUTUM  Final   Special Requests NONE  Final   Sputum evaluation   Final    Sputum specimen not acceptable for testing.  Please recollect.   C/CHARLIE FLEETWOOD AT 1583 08/19/19.PMF Performed at [facility], [address] [place], [place] [address]    Report Status 08/19/2019 FINAL  Final  MRSA PCR Screening     Status: None   Collection Time: 08/19/19  1:30 AM   Specimen: Nasal Mucosa; Nasopharyngeal  Result Value Ref Range Status   MRSA by PCR NEGATIVE NEGATIVE Final    Comment:        The GeneXpert MRSA Assay (FDA approved for NASAL specimens only), is one component of a comprehensive MRSA  colonization surveillance program. It is not intended to diagnose MRSA infection nor to guide or monitor treatment for MRSA infections. Performed at [facility], [address] [place], [place] [address]   Gastrointestinal Panel by PCR , Stool     Status: None   Collection Time: 08/19/19 12:56 PM   Specimen: Stool  Result Value Ref Range Status   Campylobacter species NOT DETECTED NOT DETECTED Final   Plesimonas shigelloides NOT DETECTED NOT DETECTED Final   Salmonella species NOT DETECTED NOT DETECTED Final   Yersinia enterocolitica NOT DETECTED NOT DETECTED Final   Vibrio species NOT DETECTED NOT DETECTED Final   Vibrio cholerae NOT DETECTED NOT DETECTED Final   Enteroaggregative E coli (EAEC) NOT DETECTED NOT DETECTED Final   Enteropathogenic E coli (EPEC) NOT DETECTED NOT DETECTED Final   Enterotoxigenic E coli (ETEC) NOT DETECTED NOT DETECTED Final   Shiga like toxin producing E coli (STEC) NOT DETECTED NOT DETECTED Final   Shigella/Enteroinvasive E coli (EIEC) NOT DETECTED NOT DETECTED Final   Cryptosporidium NOT DETECTED NOT DETECTED Final   Cyclospora cayetanensis NOT DETECTED NOT DETECTED Final   Entamoeba histolytica NOT DETECTED NOT DETECTED Final   Giardia lamblia NOT DETECTED NOT DETECTED Final   Adenovirus F40/41 NOT DETECTED NOT DETECTED Final   Astrovirus NOT DETECTED NOT DETECTED Final   Norovirus GI/GII NOT DETECTED NOT DETECTED Final   Rotavirus A NOT DETECTED NOT DETECTED Final   Sapovirus (I, II, IV, and V) NOT DETECTED NOT DETECTED Final    Comment: Performed at [facility], [address] [place], [place] [address]  C Difficile Quick Screen w PCR reflex     Status: None   Collection Time: 08/20/19 12:01 PM   Specimen: STOOL  Result Value Ref Range Status   C Diff antigen NEGATIVE NEGATIVE Final   C Diff toxin NEGATIVE NEGATIVE Final   C Diff interpretation No C. difficile detected.  Final    Comment: Performed at [facility], [address] [place], [place] [address]  CULTURE, BLOOD (ROUTINE X 2) w Reflex to ID Panel     Status: None (Preliminary result)   Collection Time: 08/20/19  1:07 PM   Specimen: BLOOD  Result Value Ref Range Status  Specimen Description BLOOD [facility]  Final   Special Requests   Final    BOTTLES DRAWN AEROBIC AND ANAEROBIC Blood Culture adequate volume   Culture   Final    NO GROWTH < 24 HOURS Performed at [facility], [address] [place], [place] [address]    Report Status PENDING  Incomplete  CULTURE, BLOOD (ROUTINE X 2) w Reflex to ID Panel     Status: None (Preliminary result)   Collection Time: 08/20/19  1:22 PM   Specimen: BLOOD  Result Value Ref Range Status   Specimen Description BLOOD [facility]  Final   Special Requests   Final    BOTTLES DRAWN AEROBIC AND ANAEROBIC Blood Culture adequate volume   Culture   Final    NO GROWTH < 24 HOURS Performed at [facility], [address], [place], [place] [address]    Report Status PENDING  Incomplete         Radiology Studies: No results found.      Scheduled Meds: . Chlorhexidine Gluconate Cloth  6 each Topical Daily  . ferrous sulfate  325 mg Oral Daily  . multivitamin with minerals  1 tablet Oral Daily  . pantoprazole (PROTONIX) IV  40 mg Intravenous Q12H  . simvastatin  20 mg Oral Daily   Continuous Infusions: . sodium chloride 50 mL/hr at 08/20/19 1342  . sodium chloride Stopped (08/19/19 0023)  . cefTRIAXone (ROCEPHIN)  IV 2 g (08/21/19 0244)     LOS: 3 days    Time spent: 26 minutes    Sharen Hones, MD [facility]   To contact the attending provider between 7A-7P or the covering provider during after hours 7P-7A, please log into the web site [email or web address] and access using universal [place] password for that web site. If you do not have the password, please call the hospital operator.  08/21/2019, 12:45 PM

## 2019-08-21 NOTE — TOC Initial Note (Addendum)
Transition of Care [facility]) - Initial/Assessment Note    Patient Details  Name: Thomas [name] MRN: [id number] Date of Birth: 1934/12/13  Transition of Care [facility]) CM/SW Contact:    Magnus Ivan, LCSW Phone Number: 08/21/2019, 4:02 PM  Clinical Narrative:        CSW met with patient at bedside using Interpreter Tablet. Interpreter # [id number]. Patient reported he lives with his wife who provides transportation to appointments. [facility] [address] PCP is Dr. Kary Kos. Patient says he has a walker at home. He denies having other equipment at home, but says he is agreeable to any recommended equipment being ordered for him. Patient reported he has someone who comes out to his home, he thinks it is through [facility]. CSW informed patient of PT and OT recommending SNF at this time. He reported he would like to return home with Home Health PT and OT through [facility].  CSW called [facility] Representative [place]. She reported she will check to make sure patient is active with them and let CSW know. If not, she will check to see if they can accommodate patient for Home Health at discharge.            4:15- Per [facility] Brittney, they closed him last month. She is checking to see if they can staff him at discharge.    Expected Discharge Plan: [place] Barriers to Discharge: Continued Medical Work up   Patient Goals and CMS Choice Patient states their goals for this hospitalization and ongoing recovery are:: to return home with home health CMS Medicare.gov Compare Post Acute Care list provided to:: Patient Choice offered to / list presented to : Patient  Expected Discharge Plan and Services Expected Discharge Plan: [place]       Living arrangements for the past 2 months: [place] Agency: [facility] Date [place]: 08/21/19   Representative spoke with at [place]: Baltazar Najjar  Prior Living Arrangements/Services Living arrangements for the past 2 months: [place] Lives with:: Spouse Patient language and need for interpreter reviewed:: Yes Do you feel safe going back to the place where you live?: Yes      Need for Family Participation in Patient Care: Yes (Comment) Care giver support system in place?: Yes (comment) Current home services: DME, Home PT, Home OT Criminal Activity/Legal Involvement Pertinent to Current Situation/Hospitalization: No - Comment as needed  Activities of Daily Living Home Assistive Devices/Equipment: None ADL Screening (condition at time of admission) Patient's cognitive ability adequate to safely complete daily activities?: No Is the patient deaf or have difficulty hearing?: Yes(American Sign Language or written communication is best) Does the patient have difficulty seeing, even when wearing glasses/contacts?: No Does the patient have difficulty concentrating, remembering, or making decisions?: No Patient able to express need for assistance with ADLs?: No Does the patient have difficulty dressing or bathing?: No Independently performs ADLs?: Yes (appropriate for developmental age) Does the patient have difficulty walking or climbing stairs?: No Weakness of Legs: None Weakness of Arms/Hands: None  Permission Sought/Granted Permission sought to share information with : Facility Arts administrator granted to share info w AGENCY: [facility]  Emotional Assessment Appearance:: Appears stated age Attitude/Demeanor/Rapport: Engaged Affect (typically observed): Calm Orientation: : Oriented to Self, Oriented to Place, Oriented to  Time, Oriented to Situation Alcohol / Substance Use: Not Applicable Psych Involvement: No (comment)  Admission diagnosis:  HCAP (healthcare-associated pneumonia) [J18.9] Sepsis ([place]) [A41.9] Sepsis with acute renal failure without  septic shock, due to unspecified organism, unspecified acute renal failure type ([place]) [A41.9, R65.20, N17.9] Patient Active Problem List   Diagnosis Date Noted  . Septicemia due to group B Streptococcus ([place]) 08/20/2019  . Acute blood loss anemia 08/20/2019  . HCAP (healthcare-associated pneumonia) 08/18/2019  . Severe sepsis with septic shock ([place]) 08/18/2019  . Acute renal failure superimposed on stage 3a chronic kidney disease ([place]) 08/18/2019  . Black stool 08/18/2019  . Iron deficiency anemia 08/18/2019  . Group B streptococcal bacteriuria 06/18/2019  . AKI (acute kidney injury) ([place]) 06/18/2019  . Lactic acidosis 06/18/2019  . Pressure ulcer, stage 2 ([place]) 06/18/2019  . Bacteremia 06/17/2019  . SIRS (systemic inflammatory response syndrome) ([place]) 06/16/2019  . Leg swelling   . Decompensated hepatic cirrhosis ([place]) 05/22/2019  . Anasarca 05/19/2019  . CRI (chronic renal insufficiency), stage 3 (moderate) 01/30/2019  . Iron deficiency anemia due to chronic blood loss 11/02/2018  . Coronary artery disease of bypass graft of native heart with stable angina pectoris ([place]) 11/02/2018  . Pure hypercholesterolemia 11/02/2018  . Biventricular automatic implantable cardioverter defibrillator in situ 11/02/2018  . GIB (gastrointestinal bleeding) 09/14/2018  . Hypotension 08/14/2018  . Chronic systolic CHF (congestive heart failure) ([place]) 08/12/2018  . Acute on chronic combined systolic and diastolic CHF (congestive heart failure) ([place])   . GI bleed 05/26/2018  . Occult GI bleeding 05/25/2018  . Normocytic anemia 05/25/2018  . Elevated troponin 05/25/2018  . Anemia   . Lymphedema 02/28/2018  . Weakness 07/15/2016  . Fatigue 07/15/2016  . Stroke (cerebrum) ([place]) 10/31/2015  . Bulbous urethral stricture 09/18/2015  . Bilateral deafness 08/19/2015  . Bilateral cataracts 08/19/2015  . Acid reflux 08/19/2015  . HLD (hyperlipidemia) 08/19/2015  . Essential hypertension 08/19/2015  .  Myocardial infarction ([place]) 08/19/2015  . Calculus of kidney 08/19/2015  . Artificial cardiac pacemaker 08/19/2015  . Gastroduodenal ulcer 08/19/2015  . Dupuytren's contracture of foot 08/19/2015  . Malignant neoplasm of prostate ([place]) 08/19/2015  . Microhematuria 08/19/2015  . CAD S/P multiple PCIs 02/22/2015  . Status post coronary artery bypass grafting 02/22/2015  . Benign essential HTN 04/02/2014  . Hematochezia 02/20/2014  . Tricuspid valve regurgitation, nonrheumatic 02/20/2014  . Mobitz type II atrioventricular block 12/12/2013  . Long term current use of anticoagulant 10/18/2013  . Cardiomyopathy, ischemic 11/11/2012  . CAD Status post coronary artery bypass grafting: 1995 11/11/2012  . Permanent atrial fibrillation ([place]) 11/11/2012  . Biventricular ICD in place 11/11/2012  . Chronic combined systolic and diastolic heart failure ([place]) 11/11/2012   PCP:  [name], MD Pharmacy:   [id number] [address] [place]Phone: [phone]: [phone]    Social Determinants of Health (SDOH) Interventions    Readmission Risk Interventions Readmission Risk Prevention Plan 08/21/2019 06/23/2019 06/20/2019  Transportation Screening Complete Complete Complete  PCP or Specialist Appt within 3-5 Days - Complete -  [place] or [place]- Complete Complete  Social Work Consult for [place]Counseling - Complete -  Palliative Care Screening - Not Applicable Not Applicable  Medication Review (Press photographer Complete Complete Complete  PCP or Specialist  appointment within 3-5 days of discharge Complete - -  HRI or Home Care Consult Complete - -  [place] Complete - -  Some recent data might be hidden

## 2019-08-21 NOTE — Progress Notes (Signed)
*  PRELIMINARY RESULTS* Echocardiogram 2D Echocardiogram has been performed.  Thomas [name] Thomas [name] 08/21/2019, 10:05 AM

## 2019-08-22 ENCOUNTER — Inpatient Hospital Stay: Payer: PPO | Admitting: Anesthesiology

## 2019-08-22 ENCOUNTER — Ambulatory Visit: Payer: PPO | Admitting: Family

## 2019-08-22 ENCOUNTER — Encounter: Payer: Self-pay | Admitting: Internal Medicine

## 2019-08-22 ENCOUNTER — Telehealth: Payer: Self-pay | Admitting: Internal Medicine

## 2019-08-22 ENCOUNTER — Encounter
Admission: EM | Disposition: A | Discharge status: Other Acute Inpatient Hospital | Payer: Self-pay | Source: Home / Self Care | Attending: Internal Medicine

## 2019-08-22 DIAGNOSIS — R55 Syncope and collapse: Secondary | ICD-10-CM

## 2019-08-22 DIAGNOSIS — D62 Acute posthemorrhagic anemia: Secondary | ICD-10-CM

## 2019-08-22 HISTORY — PX: ENTEROSCOPY: SHX5533

## 2019-08-22 LAB — TYPE AND SCREEN
ABO/RH(D): O POS
Antibody Screen: NEGATIVE
Donor AG Type: NEGATIVE
Donor AG Type: NEGATIVE
Unit division: 0
Unit division: 0
Unit division: 0
Unit division: 0
Unit division: 0

## 2019-08-22 LAB — BPAM RBC
Blood Product Expiration Date: 202106232359
Blood Product Expiration Date: 202106302359
Blood Product Expiration Date: 202107082359
Blood Product Expiration Date: 202107082359
Blood Product Expiration Date: 202107092359
ISSUE DATE / TIME: 202106051423
ISSUE DATE / TIME: 202106051737
Unit Type and Rh: 5100
Unit Type and Rh: 5100
Unit Type and Rh: 5100
Unit Type and Rh: 5100
Unit Type and Rh: 5100

## 2019-08-22 LAB — CBC WITH DIFFERENTIAL/PLATELET
Abs Immature Granulocytes: 0.11 10*3/uL — ABNORMAL HIGH (ref 0.00–0.07)
Basophils Absolute: 0 10*3/uL (ref 0.0–0.1)
Basophils Relative: 0 %
Eosinophils Absolute: 0.2 10*3/uL (ref 0.0–0.5)
Eosinophils Relative: 3 %
HCT: 29.4 % — ABNORMAL LOW (ref 39.0–52.0)
Hemoglobin: 8.8 g/dL — ABNORMAL LOW (ref 13.0–17.0)
Immature Granulocytes: 2 %
Lymphocytes Relative: 10 %
Lymphs Abs: 0.7 10*3/uL (ref 0.7–4.0)
MCH: 24.2 pg — ABNORMAL LOW (ref 26.0–34.0)
MCHC: 29.9 g/dL — ABNORMAL LOW (ref 30.0–36.0)
MCV: 80.8 fL (ref 80.0–100.0)
Monocytes Absolute: 0.8 10*3/uL (ref 0.1–1.0)
Monocytes Relative: 11 %
Neutro Abs: 5.2 10*3/uL (ref 1.7–7.7)
Neutrophils Relative %: 74 %
Platelets: 160 10*3/uL (ref 150–400)
RBC: 3.64 MIL/uL — ABNORMAL LOW (ref 4.22–5.81)
RDW: 18.4 % — ABNORMAL HIGH (ref 11.5–15.5)
WBC: 7.1 10*3/uL (ref 4.0–10.5)
nRBC: 0.3 % — ABNORMAL HIGH (ref 0.0–0.2)

## 2019-08-22 LAB — BASIC METABOLIC PANEL
Anion gap: 7 (ref 5–15)
BUN: 31 mg/dL — ABNORMAL HIGH (ref 8–23)
CO2: 27 mmol/L (ref 22–32)
Calcium: 7.8 mg/dL — ABNORMAL LOW (ref 8.9–10.3)
Chloride: 107 mmol/L (ref 98–111)
Creatinine, Ser: 1.07 mg/dL (ref 0.61–1.24)
GFR calc Af Amer: >60 mL/min (ref >60)
GFR calc non Af Amer: >60 mL/min (ref >60)
Glucose, Bld: 96 mg/dL (ref 70–99)
Potassium: 3.2 mmol/L — ABNORMAL LOW (ref 3.5–5.1)
Sodium: 141 mmol/L (ref 135–145)

## 2019-08-22 LAB — MAGNESIUM: Magnesium: 2.3 mg/dL (ref 1.7–2.4)

## 2019-08-22 SURGERY — ENTEROSCOPY
Anesthesia: General

## 2019-08-22 MED ORDER — SODIUM CHLORIDE 0.9 % IV SOLN: INTRAVENOUS | Status: DC

## 2019-08-22 MED ORDER — PROPOFOL 500 MG/50ML IV EMUL
INTRAVENOUS | Status: DC | PRN
  Administered 2019-08-22: 75 ug/kg/min via INTRAVENOUS

## 2019-08-22 MED ORDER — SPIRONOLACTONE 25 MG PO TABS
25.0000 mg | ORAL_TABLET | Freq: Every day | ORAL | Status: DC
  Administered 2019-08-22 – 2019-08-24 (×3): 25 mg via ORAL
  Filled 2019-08-22 (×4): qty 1

## 2019-08-22 MED ORDER — PROPOFOL 10 MG/ML IV BOLUS
INTRAVENOUS | Status: AC
  Filled 2019-08-22: qty 20

## 2019-08-22 MED ORDER — PROPOFOL 10 MG/ML IV BOLUS
INTRAVENOUS | Status: DC | PRN
  Administered 2019-08-22: 30 mg via INTRAVENOUS

## 2019-08-22 MED ORDER — SODIUM CHLORIDE 0.9 % IV SOLN
INTRAVENOUS | Status: DC
Start: 2019-08-22 — End: 2019-08-22

## 2019-08-22 MED ORDER — POTASSIUM CHLORIDE 20 MEQ PO PACK
40.0000 meq | PACK | Freq: Once | ORAL | Status: AC
  Administered 2019-08-22: 40 meq via ORAL
  Filled 2019-08-22 (×2): qty 2

## 2019-08-22 MED ORDER — LIDOCAINE HCL (CARDIAC) PF 100 MG/5ML IV SOSY
PREFILLED_SYRINGE | INTRAVENOUS | Status: DC | PRN
  Administered 2019-08-22: 50 mg via INTRAVENOUS

## 2019-08-22 MED ORDER — PHENYLEPHRINE HCL (PRESSORS) 10 MG/ML IV SOLN
INTRAVENOUS | Status: DC | PRN
  Administered 2019-08-22: 50 ug via INTRAVENOUS

## 2019-08-22 NOTE — Progress Notes (Signed)
Endoscopy called me to give report and noted that patient was pinned to room 212.  Endoscopy offered to call report to the nurse getting Mr. Wambold.

## 2019-08-22 NOTE — Telephone Encounter (Signed)
Dr. [name] notified that Pt is in ICU.  No further action needed.

## 2019-08-22 NOTE — Progress Notes (Signed)
Report called to Gershon Mussel, the RN receiving the patient.  Notified him of being unable to give him his medications from this morning due to them still being on hold in the [facility] s/p his endoscopy.Patient transferred to [facility] at this time.

## 2019-08-22 NOTE — Consult Note (Signed)
Cardiology Consultation:   Patient ID: Thomas [name] MRN: [id number]; DOB: 02-Jan-1935  Admit date: 08/18/2019 Date of Consult: 08/22/2019  Primary Care Provider: [name], MD Primary Cardiologist: Thomas Klein, MD  Primary Electrophysiologist:  None    Patient Profile:   Thomas [name] is a 85 y.o. male with a hx of with a history of CAD s/p remote CABG with subsequent PCI/BMS to the SVG to OM 2015 with known CTO of the SVG to RCA and patent LIMA to LAD, chronic combined systolic and diastolic CHF with recovery of EF by most recent echo, ICM, Medtronic CRT-D implanted in 2014, permanent Afib not on [place] secondary to underlying microcytic anemia with known AVMs as well as falls, prior GIB requiring PRBC transfusion with subsequent discovery of aforementioned AVMs, CKDIII, prostate CA, HTN, HLD, and deafness and evaluated today  for TEE and bacteremia / group B streptococcus with a CRT-D at the request of Thomas [name].  History of Present Illness:   Thomas [name] is an 85 year old male with PMH as above.    He has a history of CAD s/p remote CABG and subsequent PCI as above.  He also has known history of permanent A. fib.  He is not on antiplatelet therapy or anticoagulation given history of recurrent GI bleeds and chronic anemia with known AVMs, as well as history of falls.    He is s/p Medtronic CRT-D, implanted in 2014.  Most recent device interrogation without significant findings.  He has a known history of group B strep with recent admissions 2/2 sepsis and  Exacerbation of acute on chronic systolic and diastolic heart failure. He was hospitalized 05/2019 for volume overload  and required prolonged hospitalization for diuresis.  He presented to [facility] 06/2019 with fevers, chills, and lethargy.  He was noted to be febrile.  Blood cultures positive for group B streptococcus.  He reported intermittent cough, as well as slight worsening of his lower extremity edema.  He was initially placed  on broad-spectrum antibiotics, which were then consolidated to penicillin.  He noted melena for the last month.  He also noted dysphagia with certain foods and pills.  EGD from 2020 showed hiatal hernia but otherwise no significant esophageal pathology.  He has known history of small bowel AVMs due to recurrent GI bleeds.  He has also had a CT scan that has raised the possibility of cirrhosis.  Given his bacteremia, TEE was recommended and without evidence of vegetation on pacer wires or cardiac valves.  He was IV diuresed and sent home on prolonged abx.  Of note, ultrasound was performed of his left upper extremity due to LUE>RUE and without evidence of DVT.  He reports that, prior to admission, he was febrile and suddenly felt dizzy.  He reports feeling a strange sensation in his chest, almost as if it was a coldness inside of him that then moved up into his head.  This is associated with some shortness of breath.  No syncope or loss of consciousness.  No orthopnea.  He did note lower extremity edema. On 08/18/2019, he presented to [facility] ED.    In the ED, vitals significant for BP 95/53, HR 60 bpm, temperature 99.9 F, 96% on room air.  Lab work significant for elevated sodium 139, hypokalemia with potassium 3.5, glucose 115, WBC 10.9, Hgb 7.4, hematocrit 24.4 lactic acid 2.3, AKI with creatinine 2.34, BUN 57, AST 46, ALT 18, BNP 588.8.  Was admitted for further evaluation.  Echo performed 08/21/2019, he  has recovery of LVEF with most recent echo showing EF 55 to 60%.  GI consulted for evaluation of bacteremia with device and to perform TEE to evaluate for endocarditis.  Heart Pathway Score:     Past Medical History:  Diagnosis Date  . AICD (automatic cardioverter/defibrillator) present   . Biventricular ICD (implantable cardioverter-defibrillator) in place 03/24/2005   Implantation of a Medtronic Adapta ADDRO1, serial number [id number]  . CHF (congestive heart failure) ([place])   . CKD (chronic kidney  disease), stage III   . Coronary artery disease    a. s/p CABG 1986. b. Multiple PCIs/caths. c. 09/2013: s/p PTCA and BMS to SVG-OM.  [name]   . Dysrhythmia   . History of abdominal aortic aneurysm   . History of bleeding peptic ulcer 1980  . History of epididymitis 2013  . HTN (hypertension)   . Hydronephrosis with ureteropelvic junction obstruction   . Hydroureter on left 2009  . Hypertension   . Ischemic cardiomyopathy    a. Prior EF 30-35%, s/p BIV-ICD. b. 09/2013: EF 45-50%.  . Moderate tricuspid regurgitation   . PAF (paroxysmal atrial fibrillation) ([place])   . Presence of permanent cardiac pacemaker   . Prostate cancer ([place])   . Status post coronary artery bypass grafting 1986   LIMA to the LAD, SVG to OM, SVG to RCA  . Testicular swelling     Past Surgical History:  Procedure Laterality Date  . 2-D echocardiogram  11/20/2011   Ejection fraction 30-35% moderate concentric left ventricular hypertrophy. Left atrium is moderately dilated. Mild MR. Mild or  . BI-VENTRICULAR IMPLANTABLE CARDIOVERTER DEFIBRILLATOR N/A 12/16/2012   Procedure: BI-VENTRICULAR IMPLANTABLE CARDIOVERTER DEFIBRILLATOR  (CRT-D);  Surgeon: [name], MD;  Location: [facility] CATH LAB;  Service: Cardiovascular;  Laterality: N/A;  . CARDIAC CATHETERIZATION  12/10/2011   SVG to OM widely patent.  LIMA to LAD patent  . CATARACT EXTRACTION W/PHACO Right 10/12/2017   Procedure: CATARACT EXTRACTION PHACO AND INTRAOCULAR LENS PLACEMENT (IOC);  Surgeon: Birder Robson, MD;  Location: [facility] ORS;  Service: Ophthalmology;  Laterality: Right;  [place] 00:57 AP% 15.9 CDE 9.07 Fluid pack lot # [id number]  . COLONOSCOPY N/A 07/13/2018   Procedure: COLONOSCOPY;  Surgeon: Toledo, Benay Pike, MD;  Location: [facility] ENDOSCOPY;  Service: Gastroenterology;  Laterality: N/A;  . CORONARY ARTERY BYPASS GRAFT  1986  . ENTEROSCOPY N/A 09/14/2018   Procedure: ENTEROSCOPY;  Surgeon: Toledo, Benay Pike, MD;  Location: [facility] ENDOSCOPY;  Service:  Gastroenterology;  Laterality: N/A;  symptomatic anemia, GI blood loss anemia, melena, positive small bowel capsule endoscopy showing source of bleeding   . ESOPHAGOGASTRODUODENOSCOPY N/A 07/13/2018   Procedure: ESOPHAGOGASTRODUODENOSCOPY (EGD);  Surgeon: Toledo, Benay Pike, MD;  Location: [facility] ENDOSCOPY;  Service: Gastroenterology;  Laterality: N/A;  . ESOPHAGOGASTRODUODENOSCOPY N/A 09/14/2018   Procedure: ESOPHAGOGASTRODUODENOSCOPY (EGD);  Surgeon: Toledo, Benay Pike, MD;  Location: [facility] ENDOSCOPY;  Service: Gastroenterology;  Laterality: N/A;  SIGN LANAGUAGE INTERPRETER  . ESOPHAGOGASTRODUODENOSCOPY N/A 06/21/2019   Procedure: ESOPHAGOGASTRODUODENOSCOPY (EGD);  Surgeon: Toledo, Benay Pike, MD;  Location: [facility] ENDOSCOPY;  Service: Gastroenterology;  Laterality: N/A;  . ESOPHAGOGASTRODUODENOSCOPY (EGD) WITH PROPOFOL N/A 05/27/2018   Procedure: ESOPHAGOGASTRODUODENOSCOPY (EGD) WITH PROPOFOL;  Surgeon: Clarene Essex, MD;  Location: [place];  Service: Endoscopy;  Laterality: N/A;  . INSERT / REPLACE / REMOVE PACEMAKER    . LEFT HEART CATHETERIZATION WITH CORONARY/GRAFT ANGIOGRAM N/A 12/10/2011   Procedure: LEFT HEART CATHETERIZATION WITH Beatrix Fetters;  Surgeon: Thomas Klein, MD;  Location: [place] CATH LAB;  Service: Cardiovascular;  Laterality: N/A;  . LEFT HEART CATHETERIZATION WITH CORONARY/GRAFT ANGIOGRAM N/A 09/25/2013   Procedure: LEFT HEART CATHETERIZATION WITH Beatrix Fetters;  Surgeon: Blane Ohara, MD;  Location: [facility] CATH LAB;  Service: Cardiovascular;  Laterality: N/A;  . Persantine Myoview  05/06/2010   Post-rest ejection fraction 30%. No significant ischemia demonstrated. Compared to previous study there is no significant change.  . TEE WITHOUT CARDIOVERSION N/A 06/22/2019   Procedure: TRANSESOPHAGEAL ECHOCARDIOGRAM (TEE);  Surgeon: Minna Merritts, MD;  Location: [facility] ORS;  Service: Cardiovascular;  Laterality: N/A;  . TRANSURETHRAL RESECTION OF PROSTATE     s/p      Home Medications:  Prior to Admission medications   Medication Sig Start Date End Date Taking? Authorizing Provider  albuterol (VENTOLIN HFA) 108 (90 Base) MCG/ACT inhaler Inhale 2 puffs into the lungs 4 (four) times daily as needed. Patient taking differently: Inhale 2 puffs into the lungs 4 (four) times daily as needed for wheezing or shortness of breath.  08/02/18  Yes Croitoru, Mihai, MD  [employer] ACETAMINOPHEN EX ST 500 MG tablet Take 500-1,000 mg by mouth every 6 (six) hours as needed for pain or fever. 04/26/19  Yes [provider]  ferrous sulfate 325 (65 FE) MG tablet Take 1 tablet (325 mg total) by mouth daily for 30 days. 05/28/18  Yes Elodia Florence., MD  losartan (COZAAR) 25 MG tablet Take 0.5 tablets (12.5 mg total) by mouth daily. 06/26/19  Yes Danford, Suann Larry, MD  metolazone (ZAROXOLYN) 5 MG tablet Take 5 mg by mouth daily as needed. TAKE FOR WEIGHT GAIN >3lbs IN A DAY OR >5Llbs IN A WEEK   Yes [provider]  Multiple Vitamin (MULTIVITAMIN WITH MINERALS) TABS tablet Take 1 tablet by mouth daily. 05/29/19  Yes Nicole Kindred A, DO  pantoprazole (PROTONIX) 40 MG tablet Take 1 tablet (40 mg total) by mouth 2 (two) times daily before a meal. 06/25/19  Yes Danford, Suann Larry, MD  potassium chloride SA (KLOR-CON) 20 MEQ tablet Take 1 tablet (20 mEq total) by mouth 2 (two) times daily. 06/25/19  Yes Danford, Suann Larry, MD  simvastatin (ZOCOR) 20 MG tablet TAKE 1 TABLET BY MOUTH EVERY DAY Patient taking differently: Take 20 mg by mouth daily.  04/07/19  Yes Kilroy, Doreene Burke, PA-C  spironolactone (ALDACTONE) 25 MG tablet Take 1 tablet (25 mg total) by mouth daily. 08/01/19 10/30/19 Yes Hackney, Otila Kluver A, FNP  torsemide (DEMADEX) 20 MG tablet Take 2 tablets (40 mg total) by mouth 2 (two) times daily. Patient taking differently: Take 80 mg by mouth daily.  06/25/19  Yes Danford, Suann Larry, MD  triamcinolone cream (KENALOG) 0.1 % Apply 1 application topically 2  (two) times daily as needed for itching. (avoid face, groin and axilla) 04/17/19  Yes [provider]    Inpatient Medications: Scheduled Meds: . [MAR Hold] Chlorhexidine Gluconate Cloth  6 each Topical Daily  . [MAR Hold] ferrous sulfate  325 mg Oral Daily  . [MAR Hold] multivitamin with minerals  1 tablet Oral Daily  . [MAR Hold] pantoprazole (PROTONIX) IV  40 mg Intravenous Q12H  . [MAR Hold] potassium chloride  40 mEq Oral Once  . [MAR Hold] simvastatin  20 mg Oral Daily  . [MAR Hold] spironolactone  25 mg Oral Daily   Continuous Infusions: . sodium chloride 250 mL (08/22/19 0136)  . [MAR Hold] cefTRIAXone (ROCEPHIN)  IV 2 g (08/22/19 0136)   PRN Meds: [MAR Hold] acetaminophen, [MAR Hold] albuterol, [MAR Hold] dextromethorphan-guaiFENesin, [  MAR Hold] ondansetron (ZOFRAN) IV  Allergies:    Allergies  Allergen Reactions  . Entresto [Sacubitril-Valsartan] Swelling    And bruising of arm  . Phenazopyridine Nausea Only and Other (See Comments)    GI UPSET  . Ramipril Other (See Comments)    unk Other reaction(s): Other (See Comments), Unknown unk    Social History:   Social History   Socioeconomic History  . Marital status: Married    Spouse name: Not on file  . Number of children: Not on file  . Years of education: Not on file  . Highest education level: Not on file  Occupational History  . Not on file  Tobacco Use  . Smoking status: Former Smoker    Quit date: 03/15/1985    Years since quitting: 34.4  . Smokeless tobacco: Never Used  Substance and Sexual Activity  . Alcohol use: No    Comment: occas.  . Drug use: No  . Sexual activity: Yes  Other Topics Concern  . Not on file  Social History Narrative  . Not on file   Social Determinants of Health   Financial Resource Strain: Low Risk   . Difficulty of Paying Living Expenses: Not hard at all  Food Insecurity: No Food Insecurity  . Worried About Charity fundraiser in the Last Year: Never true   . Ran Out of Food in the Last Year: Never true  Transportation Needs: No Transportation Needs  . Lack of Transportation (Medical): No  . Lack of Transportation (Non-Medical): No  Physical Activity: Inactive  . Days of Exercise per Week: 0 days  . Minutes of Exercise per Session: 0 min  Stress: No Stress Concern Present  . Feeling of Stress : Not at all  Social Connections: Slightly Isolated  . Frequency of Communication with Friends and Family: Never  . Frequency of Social Gatherings with Friends and Family: Never  . Attends Religious Services: 1 to 4 times per year  . Active Member of Clubs or Organizations: Yes  . Attends Archivist Meetings: 1 to 4 times per year  . Marital Status: Married  Human resources officer Violence: Not At Risk  . Fear of Current or Ex-Partner: No  . Emotionally Abused: No  . Physically Abused: No  . Sexually Abused: No    Family History:    Family History  Problem Relation Age of Onset  . Hypertension Father      ROS:  Please see the history of present illness.  Review of Systems  Constitutional: Positive for fever.  Respiratory: Positive for shortness of breath.   Cardiovascular: Positive for chest pain and leg swelling. Negative for orthopnea.  Gastrointestinal: Positive for melena.  Musculoskeletal: Negative for falls.  Neurological: Positive for dizziness. Negative for loss of consciousness.  All other systems reviewed and are negative.   All other ROS reviewed and negative.     Physical Exam/Data:   Vitals:   08/22/19 1400 08/22/19 1405 08/22/19 1500 08/22/19 1536  BP:  119/65 112/66 124/62  Pulse: 67 62 (!) 54 73  Resp: (!) 23 (!) 22 19 (!) 24  Temp:      TempSrc:      SpO2: 99% 97% 94% 97%  Weight:      Height:        Intake/Output Summary (Last 24 hours) at 08/22/2019 1539 Last data filed at 08/22/2019 0800 Gross per 24 hour  Intake 720 ml  Output 1700 ml  Net -980 ml  Last 3 Weights 08/19/2019 08/18/2019 08/16/2019   Weight (lbs) 203 lb 4.2 oz 197 lb 4.8 oz 205 lb  Weight (kg) 92.2 kg 89.495 kg 92.987 kg     Body mass index is 26.1 kg/m.  General: Frail and elderly male, in no acute distress HEENT: normal Neck: no JVD Vascular:  radial pulses 2+ bilaterally Cardiac:  normal S1, S2; RRR; 3/6 systolic murmur Lungs:  clear to auscultation bilaterally, no wheezing, rhonchi or rales  Abd: soft, nontender, no hepatomegaly  Ext: 2+ bilateral  Woody edema, LUE>RUE Musculoskeletal:  No deformities, BUE and BLE strength normal and equal Skin: warm and dry  Neuro:  No focal abnormalities noted Psych:  Normal affect   EKG:  The EKG was personally reviewed and demonstrates:  Ventricularly paced rhythm Telemetry:  Telemetry was personally reviewed and demonstrates: Ventricularly paced rhythm, 60s  Relevant CV Studies: Echocardiogram 08/21/2019 1. Left ventricular ejection fraction, by estimation, is 55 to 60%. The  left ventricle has normal function. The left ventricle has no regional  wall motion abnormalities. Left ventricular diastolic parameters were  normal.  2. Right ventricular systolic function is normal. The right ventricular  size is normal. There is moderately elevated pulmonary artery systolic  pressure.  3. Left atrial size was mildly dilated.  4. Right atrial size was mildly dilated.  5. The mitral valve is normal in structure. Moderate mitral valve  regurgitation.  6. Tricuspid valve regurgitation is moderate.  7. The aortic valve is normal in structure. Aortic valve regurgitation is  trivial.   TTE (06/19/19): 1. Left ventricular ejection fraction, by estimation, is 35 to 40%. The  left ventricle has moderately decreased function. The left ventricle has  no regional wall motion abnormalities. The left ventricular internal  cavity size was mildly to moderately  dilated. Left ventricular diastolic parameters are indeterminate.  2. Right ventricular systolic function is mildly  reduced. The right  ventricular size is normal. There is moderately elevated pulmonary artery  systolic pressure. The estimated right ventricular systolic pressure is  32.9 mmHg.  3. Left atrial size was severely dilated.  4. Right atrial size was moderately dilated.  5. Moderate pleural effusion in the left lateral region.  6. The mitral valve is normal in structure. Mild mitral valve  regurgitation. No evidence of mitral stenosis.  7. Tricuspid valve regurgitation is moderate.  8. The aortic valve is normal in structure. Aortic valve regurgitation is  not visualized. Mild to moderate aortic valve sclerosis/calcification is  present, without any evidence of aortic stenosis.  9. Moderately dilated pulmonary artery.  10. The inferior vena cava is dilated in size with <50% respiratory  variability, suggesting right atrial pressure of 15 mmHg.  11. No clear vegetation but the RV pacemaker lead appears thinkened. A  vegetation can not be excluded. Consider TEE.  Laboratory Data:  High Sensitivity Troponin:  No results for input(s): TROPONINIHS in the last 720 hours.   Cardiac EnzymesNo results for input(s): TROPONINI in the last 168 hours. No results for input(s): TROPIPOC in the last 168 hours.  Chemistry Recent Labs  Lab 08/20/19 0446 08/20/19 0446 08/20/19 2119 08/21/19 0504 08/22/19 0320  NA 139  --   --  141 141  K 2.9*   < > 3.0* 3.3* 3.2*  CL 102  --   --  108 107  CO2 27  --   --  28 27  GLUCOSE 128*  --   --  103* 96  BUN 67*  --   --  48* 31*  CREATININE 1.95*  --   --  1.42* 1.07  CALCIUM 7.5*  --   --  7.8* 7.8*  GFRNONAA 31*  --   --  46* >60  GFRAA 36*  --   --  53* >60  ANIONGAP 10  --   --  5 7   < > = values in this interval not displayed.    Recent Labs  Lab 08/18/19 1353  PROT 4.9*  ALBUMIN 1.9*  AST 46*  ALT 18  ALKPHOS 87  BILITOT 1.1   Hematology Recent Labs  Lab 08/19/19 2244 08/19/19 2244 08/20/19 0446 08/20/19 1322 08/21/19 0504  08/21/19 1749 08/22/19 0320  WBC 9.4  --  9.2  --   --   --  7.1  RBC 3.43*  --  3.43*  --   --   --  3.64*  HGB 8.5*   < > 8.7*   < > 8.8* 9.3* 8.8*  HCT 26.9*  --  26.5*  --   --   --  29.4*  MCV 78.4*  --  77.3*  --   --   --  80.8  MCH 24.8*  --  25.4*  --   --   --  24.2*  MCHC 31.6  --  32.8  --   --   --  29.9*  RDW 18.2*  --  18.6*  --   --   --  18.4*  PLT 142*  --  131*  --   --   --  160   < > = values in this interval not displayed.   BNP Recent Labs  Lab 08/18/19 1353  BNP 588.8*    DDimer No results for input(s): DDIMER in the last 168 hours.   Radiology/Studies:  ECHOCARDIOGRAM COMPLETE  Result Date: 08/21/2019    ECHOCARDIOGRAM REPORT   Patient Name:   JENNA ARDOIN Date of Exam: 08/21/2019 Medical Rec #:  [id number]      Height:       74.0 in Accession #:    [id number]     Weight:       203.3 lb Date of Birth:  1934-06-29     BSA:          2.188 m Patient Age:    76 years       BP:           121/57 mmHg Patient Gender: M              HR:           61 bpm. Exam Location:  [facility] Procedure: 2D Echo, Color Doppler and Cardiac Doppler Indications:     R78.81 Bacteremia  History:         Patient has prior history of Echocardiogram examinations, most                  recent 06/22/2019. ICM and CHF, Pacemaker, CKD; Risk                  Factors:Hypertension.  Sonographer:     Charmayne Sheer RDCS (AE) Referring Phys:  [id number] Sharen Hones Diagnosing Phys: Serafina Royals MD IMPRESSIONS  1. Left ventricular ejection fraction, by estimation, is 55 to 60%. The left ventricle has normal function. The left ventricle has no regional wall motion abnormalities. Left ventricular diastolic parameters were normal.  2. Right ventricular systolic function is normal. The right ventricular size is normal. There is moderately elevated  pulmonary artery systolic pressure.  3. Left atrial size was mildly dilated.  4. Right atrial size was mildly dilated.  5. The mitral valve is normal in structure. Moderate  mitral valve regurgitation.  6. Tricuspid valve regurgitation is moderate.  7. The aortic valve is normal in structure. Aortic valve regurgitation is trivial. FINDINGS  Left Ventricle: Left ventricular ejection fraction, by estimation, is 55 to 60%. The left ventricle has normal function. The left ventricle has no regional wall motion abnormalities. The left ventricular internal cavity size was normal in size. There is  no left ventricular hypertrophy. Left ventricular diastolic parameters were normal. Right Ventricle: The right ventricular size is normal. No increase in right ventricular wall thickness. Right ventricular systolic function is normal. There is moderately elevated pulmonary artery systolic pressure. The tricuspid regurgitant velocity is 3.12 m/s, and with an assumed right atrial pressure of 10 mmHg, the estimated right ventricular systolic pressure is 76.2 mmHg. Left Atrium: Left atrial size was mildly dilated. Right Atrium: Right atrial size was mildly dilated. Pericardium: There is no evidence of pericardial effusion. Mitral Valve: The mitral valve is normal in structure. Moderate mitral valve regurgitation. MV peak gradient, 7.3 mmHg. The mean mitral valve gradient is 2.0 mmHg. Tricuspid Valve: The tricuspid valve is normal in structure. Tricuspid valve regurgitation is moderate. Aortic Valve: The aortic valve is normal in structure. Aortic valve regurgitation is trivial. Aortic valve mean gradient measures 5.0 mmHg. Aortic valve peak gradient measures 11.2 mmHg. Aortic valve area, by VTI measures 2.80 cm. Pulmonic Valve: The pulmonic valve was normal in structure. Pulmonic valve regurgitation is trivial. Aorta: The aortic root and ascending aorta are structurally normal, with no evidence of dilitation. IAS/Shunts: No atrial level shunt detected by color flow Doppler.  LEFT VENTRICLE PLAX 2D LVIDd:         6.16 cm      Diastology LVIDs:         5.53 cm      LV e' lateral:   13.40 cm/s LV PW:          1.11 cm      LV E/e' lateral: 9.1 LV IVS:        0.88 cm      LV e' medial:    7.29 cm/s LVOT diam:     2.30 cm      LV E/e' medial:  16.8 LV SV:         77 LV SV Index:   35 LVOT Area:     4.15 cm  LV Volumes (MOD) LV vol d, MOD A2C: 163.0 ml LV vol d, MOD A4C: 167.0 ml LV vol s, MOD A2C: 74.6 ml LV vol s, MOD A4C: 77.1 ml LV SV MOD A2C:     88.4 ml LV SV MOD A4C:     167.0 ml LV SV MOD BP:      98.1 ml RIGHT VENTRICLE RV Basal diam:  4.69 cm LEFT ATRIUM              Index       RIGHT ATRIUM           Index LA diam:        6.20 cm  2.83 cm/m  RA Area:     34.40 cm LA Vol (A2C):   109.0 ml 49.81 ml/m RA Volume:   123.00 ml 56.20 ml/m LA Vol (A4C):   130.0 ml 59.40 ml/m LA Biplane Vol: 119.0 ml 54.38 ml/m  AORTIC VALVE  PULMONIC VALVE AV Area (Vmax):    2.59 cm     PV Vmax:       1.14 m/s AV Area (Vmean):   2.74 cm     PV Vmean:      80.900 cm/s AV Area (VTI):     2.80 cm     PV VTI:        0.252 m AV Vmax:           167.00 cm/s  PV Peak grad:  5.2 mmHg AV Vmean:          103.000 cm/s PV Mean grad:  3.0 mmHg AV VTI:            0.276 m AV Peak Grad:      11.2 mmHg AV Mean Grad:      5.0 mmHg LVOT Vmax:         104.00 cm/s LVOT Vmean:        67.900 cm/s LVOT VTI:          0.186 m LVOT/AV VTI ratio: 0.67  AORTA Ao Root diam: 4.20 cm MITRAL VALVE                TRICUSPID VALVE MV Area (PHT): 3.94 cm     TR Peak grad:   38.9 mmHg MV Peak grad:  7.3 mmHg     TR Vmax:        312.00 cm/s MV Mean grad:  2.0 mmHg MV Vmax:       1.35 m/s     SHUNTS MV Vmean:      61.1 cm/s    Systemic VTI:  0.19 m MV Decel Time: 193 msec     Systemic Diam: 2.30 cm MV E velocity: 122.50 cm/s Serafina Royals MD Electronically signed by Serafina Royals MD Signature Date/Time: 08/21/2019/5:42:42 PM    Final     Assessment and Plan:   Group B streptococcus Bacteremia with CRT-D --Consulted for TEE due to bacteremia with a cardiac device and in the setting of native valvular heart disease. TEE indicated to rule out  endocarditis.  He has a history of previous admissions with group B Streptococcus bacteremia on review of EMR. --He has reported melena over the last month with known chronic anemia and history of GIB. FOBT positive. GI consulted and to perform 3PM enteroscopy today, which should be performed first to evaluate the safety of performing TEE.  --TEE currently scheduled for tomorrow AM 6/9 at 9AM with Dr. Garen Lah, pending results of GI enteroscopy. Risks and benefits of transesophageal echocardiogram have been explained including risks of esophageal damage, perforation (1:10,000 risk), bleeding, pharyngeal hematoma as well as other potential complications associated with conscious sedation including aspiration, arrhythmia, respiratory failure and death. Alternatives to treatment were discussed, questions were answered. Patient is willing to proceed.  --NPO after midnight.  --Pending results of TEE, may need to loop in EP to consider if patient is a candidate for device removal. Will defer to ID for dosing and duration of abx, as well as outpatient follow-up. He will likely need a PICC line placed and prolonged course of abx.  Near Syncope --Likely in the setting of the above infection and melena as above, as well as in the setting of his comorbid conditions.   CAD s/p CABG with PCI  Demand Ischemia --No angina reported. No current plans for ischemic evaluation.  No DAPT in the setting of recurrent GIB, chronic anemia, current melena. Restart PTA BB and statin following his  procedures.  Permanent Afib --Not on [place] 2/2 previous GIB and known AVMs with current melena. CHA2DS2VASc score of at least  5 (CHF, HTN, age1, vascular). Restart PTA BB if room in BP following procedures.  Chronic combined systolic and diastolic CHF/ICM with recovery of EF  --No current SOB.  Most recent echo shows recovery of EF to 55 to 60%.  Bilateral edema noted on exam and a chronic finding. No evidence of acute  decompensation though would benefit from restarting diuresis / medical therapy  following his procedure if renal function allows given his LEE. --Restart PTA BB if room in BP following procedure, as well as losartan and torsemide if renal function / BP allow. Daily BMET. Monitor I/Os, daily weights. -1.6L for today and +3.5L since admission.   AOCKDIII, improved Cr --Daily BMET.  Cr improved from earlier AKI.  Hypokalemia --K 3.2. Replete with goal 4.0. Mg at goal 2.0.   HTN --BP well controlled. Continue PTA medications as BP / renal function allows.  Language barrier --ASL video interpreter utilized.  Anemia with known AVM; current melena --Hgb 8.8 with Hct 29.4. Daily CBC.Transfusion recommended with hemoglobin below 8.0. Not on [place]. GI consulted with enteroscopy to occur before TEE and to evaluate safety of TEE.   For questions or updates, please contact [place] Please consult [email or web address] for contact info under     Signed, [name] Chaco, PA-C  08/22/2019 3:39 PM

## 2019-08-22 NOTE — Consult Note (Deleted)
[facility] Consultation Note  Patient ID: Thomas [name], MRN: [id number], DOB/AGE: [id number] y.o. Admit date: 08/18/2019   Date of Consult: 08/22/2019 Primary Physician: [name], MD Primary Cardiologist: [facility]  Chief Complaint:  Chief Complaint  Patient presents with  . Weakness   Reason for Consult: Endocarditis  HPI: 85 y.o. 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  At that time the patient was placed on appropriate antibiotics but there was concerns that the patient had endocarditis.  Transesophageal echocardiogram was performed and the patient had no evidence of endocarditis at that time.  Current echocardiogram has shown no evidence of significant valvular heart disease or prominent concerns of valvular vegetations although images were not pristine.  Overall ejection fraction was 40 to 45%.  The patient has had a recurrence of Streptococcus B bacteremia and there is likely source of his AICD and biventricular pacer as the cause.  Therefore it would be appropriate for extraction of leads to relieve concerns of continued recurrent bacteremia.  Then further evaluate after this extraction for transesophageal echocardiogram if concerns of recurrence or concerns of valvular involvement.  This includes the possibility of valvular involvement with the tricuspid valve which would continue to be involved if extraction does not occur.  Currently the patient is hemodynamically stable  Past Medical History:  Diagnosis Date  . AICD (automatic cardioverter/defibrillator) present   . Biventricular ICD (implantable  cardioverter-defibrillator) in place 03/24/2005   Implantation of a Med[employer]onic Adapta ADDRO1, serial number [id number]  . CHF (congestive heart failure) ([place])   . CKD (chronic kidney disease), stage III   . Coronary artery disease    a. s/p CABG 1986. b. Multiple PCIs/caths. c. 09/2013: s/p PTCA and BMS to SVG-OM.  [name]   . Dysrhythmia   . History of abdominal aortic aneurysm   . History of bleeding peptic ulcer 1980  . History of epididymitis 2013  . HTN (hypertension)   . Hydronephrosis with ureteropelvic junction obstruction   . Hydroureter on left 2009  . Hypertension   . Ischemic cardiomyopathy    a. Prior EF 30-35%, s/p BIV-ICD. b. 09/2013: EF 45-50%.  . Moderate tricuspid regurgitation   . PAF (paroxysmal atrial fibrillation) ([place])   . Presence of permanent cardiac pacemaker   . Prostate cancer ([place])   . Status post coronary artery bypass grafting 1986   LIMA to the LAD, SVG to OM, SVG to RCA  . Testicular swelling       Surgical History:  Past Surgical History:  Procedure Laterality Date  . 2-D echocardiogram  11/20/2011   Ejection fraction 30-35% moderate concentric left ventricular hypertrophy. Left atrium is moderately dilated. Mild MR. Mild or  . BI-VENTRICULAR IMPLANTABLE CARDIOVERTER DEFIBRILLATOR N/A 12/16/2012   Procedure: BI-VENTRICULAR IMPLANTABLE CARDIOVERTER DEFIBRILLATOR  (CRT-D);  Surgeon: [name], MD;  Location: [facility] CATH LAB;  Service: Cardiovascular;  Laterality: N/A;  . CARDIAC CATHETERIZATION  12/10/2011   SVG to OM widely patent.  LIMA to LAD patent  . CATARACT EXTRACTION W/PHACO Right 10/12/2017   Procedure: CATARACT EXTRACTION PHACO AND INTRAOCULAR LENS PLACEMENT (IOC);  Surgeon: Birder Robson, MD;  Location: [facility] ORS;  Service: Ophthalmology;  Laterality: Right;  [place] 00:57 AP% 15.9  CDE 9.07 Fluid pack lot # [id number]  . COLONOSCOPY N/A 07/13/2018   Procedure: COLONOSCOPY;  Surgeon: Toledo, Benay Pike, MD;  Location: [facility] ENDOSCOPY;  Service:  Gastroenterology;  Laterality: N/A;  . CORONARY ARTERY BYPASS GRAFT  1986  . ENTEROSCOPY N/A 09/14/2018   Procedure: ENTEROSCOPY;  Surgeon: Toledo, Benay Pike, MD;  Location: [facility] ENDOSCOPY;  Service: Gastroenterology;  Laterality: N/A;  symptomatic anemia, GI blood loss anemia, melena, positive small bowel capsule endoscopy showing source of bleeding   . ESOPHAGOGASTRODUODENOSCOPY N/A 07/13/2018   Procedure: ESOPHAGOGASTRODUODENOSCOPY (EGD);  Surgeon: Toledo, Benay Pike, MD;  Location: [facility] ENDOSCOPY;  Service: Gastroenterology;  Laterality: N/A;  . ESOPHAGOGASTRODUODENOSCOPY N/A 09/14/2018   Procedure: ESOPHAGOGASTRODUODENOSCOPY (EGD);  Surgeon: Toledo, Benay Pike, MD;  Location: [facility] ENDOSCOPY;  Service: Gastroenterology;  Laterality: N/A;  SIGN LANAGUAGE INTERPRETER  . ESOPHAGOGASTRODUODENOSCOPY N/A 06/21/2019   Procedure: ESOPHAGOGASTRODUODENOSCOPY (EGD);  Surgeon: Toledo, Benay Pike, MD;  Location: [facility] ENDOSCOPY;  Service: Gastroenterology;  Laterality: N/A;  . ESOPHAGOGASTRODUODENOSCOPY (EGD) WITH PROPOFOL N/A 05/27/2018   Procedure: ESOPHAGOGASTRODUODENOSCOPY (EGD) WITH PROPOFOL;  Surgeon: Clarene Essex, MD;  Location: [place] [facility];  Service: Endoscopy;  Laterality: N/A;  . INSERT / REPLACE / REMOVE PACEMAKER    . LEFT HEART CATHETERIZATION WITH CORONARY/GRAFT ANGIOGRAM N/A 12/10/2011   Procedure: LEFT HEART CATHETERIZATION WITH Beatrix Fetters;  Surgeon: Sanda Klein, MD;  Location: [facility] CATH LAB;  Service: Cardiovascular;  Laterality: N/A;  . LEFT HEART CATHETERIZATION WITH CORONARY/GRAFT ANGIOGRAM N/A 09/25/2013   Procedure: LEFT HEART CATHETERIZATION WITH Beatrix Fetters;  Surgeon: Blane Ohara, MD;  Location: [facility] C[facility] LAB;  Service: Cardiovascular;  Laterality: N/A;  . Persantine Myoview  05/06/2010   Post-rest ejection fraction 30%. No significant ischemia demonstrated. Compared to previous study there is no significant change.  . TEE WITHOUT CARDIOVERSION N/A 06/22/2019    Procedure: TRANSESOPHAGEAL ECHOCARDIOGRAM (TEE);  Surgeon: Minna Merritts, MD;  Location: [facility] ORS;  Service: Cardiovascular;  Laterality: N/A;  . TRANSURETHRAL RESECTION OF PROSTATE     s/p     Home Meds: Prior to Admission medications   Medication Sig Start Date End Date Taking? Authorizing Provider  albuterol (VENTOLIN HFA) 108 (90 Base) MCG/ACT inhaler Inhale 2 puffs into the lungs 4 (four) times daily as needed. Patient taking differently: Inhale 2 puffs into the lungs 4 (four) times daily as needed for wheezing or shortness of breath.  08/02/18  Yes Croitoru, Mihai, MD  [employer] ACETAMINOPHEN EX ST 500 MG tablet Take 500-1,000 mg by mouth every 6 (six) hours as needed for pain or fever. 04/26/19  Yes [provider]  ferrous sulfate 325 (65 FE) MG tablet Take 1 tablet (325 mg total) by mouth daily for 30 days. 05/28/18  Yes Elodia Florence., MD  losartan (COZAAR) 25 MG tablet Take 0.5 tablets (12.5 mg total) by mouth daily. 06/26/19  Yes Danford, Suann Larry, MD  metolazone (ZAROXOLYN) 5 MG tablet Take 5 mg by mouth daily as needed. TAKE FOR WEIGHT GAIN >3lbs IN A DAY OR >5Llbs IN A WEEK   Yes [provider]  Multiple Vitamin (MULTIVITAMIN WITH MINERALS) TABS tablet Take 1 tablet by mouth daily. 05/29/19  Yes Nicole Kindred A, DO  pantoprazole (PROTONIX) 40 MG tablet Take 1 tablet (40 mg total) by mouth 2 (two) times daily before a meal. 06/25/19  Yes Danford, Suann Larry, MD  potassium chloride SA (KLOR-CON) 20 MEQ tablet Take 1 tablet (20 mEq total) by mouth 2 (two) times daily. 06/25/19  Yes Danford, Suann Larry,  MD  simvastatin (ZOCOR) 20 MG tablet TAKE 1 TABLET BY MOUTH EVERY DAY Patient taking differently: Take 20 mg by mouth daily.  04/07/19  Yes Kilroy, Doreene Burke, PA-C  spironolactone (ALDACTONE) 25 MG tablet Take 1 tablet (25 mg total) by mouth daily. 08/01/19 10/30/19 Yes Hackney, Otila Kluver A, FNP  torsemide (DEMADEX) 20 MG tablet Take 2 tablets (40 mg total) by mouth  2 (two) times daily. Patient taking differently: Take 80 mg by mouth daily.  06/25/19  Yes Danford, Suann Larry, MD  triamcinolone cream (KENALOG) 0.1 % Apply 1 application topically 2 (two) times daily as needed for itching. (avoid face, groin and axilla) 04/17/19  Yes [provider]    Inpatient Medications:  . Chlorhexidine Gluconate Cloth  6 each Topical Daily  . ferrous sulfate  325 mg Oral Daily  . multivitamin with minerals  1 tablet Oral Daily  . pantoprazole (PROTONIX) IV  40 mg Intravenous Q12H  . simvastatin  20 mg Oral Daily  . spironolactone  25 mg Oral Daily   . sodium chloride 250 mL (08/22/19 0136)  . cefTRIAXone (ROCEPHIN)  IV 2 g (08/22/19 0136)    Allergies:  Allergies  Allergen Reactions  . Entresto [Sacubitril-Valsartan] Swelling    And bruising of arm  . Phenazopyridine Nausea Only and Other (See Comments)    GI UPSET  . Ramipril Other (See Comments)    unk Other reaction(s): Other (See Comments), Unknown unk    Social History   Socioeconomic History  . Marital status: Married    Spouse name: Not on file  . Number of children: Not on file  . Years of education: Not on file  . Highest education level: Not on file  Occupational History  . Not on file  Tobacco Use  . Smoking status: Former Smoker    Quit date: 03/15/1985    Years since quitting: 34.4  . Smokeless tobacco: Never Used  Substance and Sexual Activity  . Alcohol use: No    Comment: occas.  . Drug use: No  . Sexual activity: Yes  Other Topics Concern  . Not on file  Social History Narrative  . Not on file   Social Determinants of Health   Financial Resource Strain: Low Risk   . Difficulty of Paying Living Expenses: Not hard at all  Food Insecurity: No Food Insecurity  . Worried About Charity fundraiser in the Last Year: Never true  . Ran Out of Food in the Last Year: Never true  Transportation Needs: No Transportation Needs  . Lack of Transportation (Medical): No   . Lack of Transportation (Non-Medical): No  Physical Activity: Inactive  . Days of Exercise per Week: 0 days  . Minutes of Exercise per Session: 0 min  Stress: No Stress Concern Present  . Feeling of Stress : Not at all  Social Connections: Slightly Isolated  . Frequency of Communication with Friends and Family: Never  . Frequency of Social Gatherings with Friends and Family: Never  . Attends Religious Services: 1 to 4 times per year  . Active Member of Clubs or Organizations: Yes  . Attends Archivist Meetings: 1 to 4 times per year  . Marital Status: Married  Human resources officer Violence: Not At Risk  . Fear of Current or Ex-Partner: No  . Emotionally Abused: No  . Physically Abused: No  . Sexually Abused: No     Family History  Problem Relation Age of Onset  . Hypertension Father  Review of Systems Positive for shortness of breath Negative for: General:  chills, fever, night sweats or weight changes.  Cardiovascular: PND orthopnea syncope dizziness  Dermatological skin lesions rashes Respiratory: Cough congestion Urologic: Frequent urination urination at night and hematuria Abdominal: negative for nausea, vomiting, diarrhea, bright red blood per rectum, melena, or hematemesis Neurologic: negative for visual changes, and/or hearing changes  All other systems reviewed and are otherwise negative except as noted above.  Labs: No results for input(s): CKTOTAL, CKMB, TROPONINI in the last 72 hours. Lab Results  Component Value Date   WBC 7.1 08/22/2019   HGB 8.8 (L) 08/22/2019   HCT 29.4 (L) 08/22/2019   MCV 80.8 08/22/2019   PLT 160 08/22/2019    Recent Labs  Lab 08/18/19 1353 08/19/19 0227 08/22/19 0320  NA 139   < > 141  K 3.5   < > 3.2*  CL 99   < > 107  CO2 28   < > 27  BUN 57*   < > 31*  CREATININE 2.34*   < > 1.07  CALCIUM 7.9*   < > 7.8*  PROT 4.9*  --   --   BILITOT 1.1  --   --   ALKPHOS 87  --   --   ALT 18  --   --   AST 46*  --    --   GLUCOSE 115*   < > 96   < > = values in this interval not displayed.   Lab Results  Component Value Date   CHOL 92 11/01/2015   HDL 31 (L) 11/01/2015   LDLCALC 46 11/01/2015   TRIG 76 11/01/2015   No results found for: DDIMER  Radiology/Studies:  DG Chest Port 1 View  Result Date: 08/18/2019 CLINICAL DATA:  Fever and weakness EXAM: PORTABLE CHEST 1 VIEW COMPARISON:  July 14, 2019 FINDINGS: There is cardiomegaly with pulmonary vascularity normal. Pacemaker leads are attached to right atrium, right ventricle, and coronary sinus. There is consolidation in the left lower lobe with small left pleural effusion. There is a small right pleural effusion with slight right base atelectasis. Patient is status post internal mammary bypass grafting. There is aortic atherosclerosis. No adenopathy. There is arthropathy in each shoulder. IMPRESSION: Cardiomegaly. Airspace opacity consistent with pneumonia left base. Small pleural effusions bilaterally. Postoperative changes noted. Aortic Atherosclerosis (ICD10-I70.0). Electronically Signed   By: Lowella Grip III M.D.   On: 08/18/2019 14:20   ECHOCARDIOGRAM COMPLETE  Result Date: 08/21/2019    ECHOCARDIOGRAM REPORT   Patient Name:   CLENTON ESPER Date of Exam: 08/21/2019 Medical Rec #:  [id number]      Height:       74.0 in Accession #:    [id number]     Weight:       203.3 lb Date of Birth:  [birth year]     BSA:          2.188 m Patient Age:    29 years       BP:           121/57 mmHg Patient Gender: M              HR:           61 bpm. Exam Location:  [facility] Procedure: 2D Echo, Color Doppler and Cardiac Doppler Indications:     R78.81 Bacteremia  History:         Patient has prior history of Echocardiogram examinations, most  recent 06/22/2019. ICM and CHF, Pacemaker, CKD; Risk                  Factors:Hypertension.  Sonographer:     Charmayne Sheer RDCS (AE) Referring Phys:  [id number] Sharen Hones Diagnosing Phys: Serafina Royals MD IMPRESSIONS  1.  Left ventricular ejection fraction, by estimation, is 55 to 60%. The left ventricle has normal function. The left ventricle has no regional wall motion abnormalities. Left ventricular diastolic parameters were normal.  2. Right ventricular systolic function is normal. The right ventricular size is normal. There is moderately elevated pulmonary artery systolic pressure.  3. Left atrial size was mildly dilated.  4. Right atrial size was mildly dilated.  5. The mitral valve is normal in structure. Moderate mitral valve regurgitation.  6. Tricuspid valve regurgitation is moderate.  7. The aortic valve is normal in structure. Aortic valve regurgitation is trivial. FINDINGS  Left Ventricle: Left ventricular ejection fraction, by estimation, is 55 to 60%. The left ventricle has normal function. The left ventricle has no regional wall motion abnormalities. The left ventricular internal cavity size was normal in size. There is  no left ventricular hypertrophy. Left ventricular diastolic parameters were normal. Right Ventricle: The right ventricular size is normal. No increase in right ventricular wall thickness. Right ventricular systolic function is normal. There is moderately elevated pulmonary artery systolic pressure. The tricuspid regurgitant velocity is 3.12 m/s, and with an assumed right atrial pressure of 10 mmHg, the estimated right ventricular systolic pressure is 78.6 mmHg. Left Atrium: Left atrial size was mildly dilated. Right Atrium: Right atrial size was mildly dilated. Pericardium: There is no evidence of pericardial effusion. Mitral Valve: The mitral valve is normal in structure. Moderate mitral valve regurgitation. MV peak gradient, 7.3 mmHg. The mean mitral valve gradient is 2.0 mmHg. Tricuspid Valve: The tricuspid valve is normal in structure. Tricuspid valve regurgitation is moderate. Aortic Valve: The aortic valve is normal in structure. Aortic valve regurgitation is trivial. Aortic valve mean gradient  measures 5.0 mmHg. Aortic valve peak gradient measures 11.2 mmHg. Aortic valve area, by VTI measures 2.80 cm. Pulmonic Valve: The pulmonic valve was normal in structure. Pulmonic valve regurgitation is trivial. Aorta: The aortic root and ascending aorta are structurally normal, with no evidence of dilitation. IAS/Shunts: No atrial level shunt detected by color flow Doppler.  LEFT VENTRICLE PLAX 2D LVIDd:         6.16 cm      Diastology LVIDs:         5.53 cm      LV e' lateral:   13.40 cm/s LV PW:         1.11 cm      LV E/e' lateral: 9.1 LV IVS:        0.88 cm      LV e' medial:    7.29 cm/s LVOT diam:     2.30 cm      LV E/e' medial:  16.8 LV SV:         77 LV SV Index:   35 LVOT Area:     4.15 cm  LV Volumes (MOD) LV vol d, MOD A2C: 163.0 ml LV vol d, MOD A4C: 167.0 ml LV vol s, MOD A2C: 74.6 ml LV vol s, MOD A4C: 77.1 ml LV SV MOD A2C:     88.4 ml LV SV MOD A4C:     167.0 ml LV SV MOD BP:      98.1 ml RIGHT VENTRICLE RV  Basal diam:  4.69 cm LEFT ATRIUM              Index       RIGHT ATRIUM           Index LA diam:        6.20 cm  2.83 cm/m  RA Area:     34.40 cm LA Vol (A2C):   109.0 ml 49.81 ml/m RA Volume:   123.00 ml 56.20 ml/m LA Vol (A4C):   130.0 ml 59.40 ml/m LA Biplane Vol: 119.0 ml 54.38 ml/m  AORTIC VALVE                    PULMONIC VALVE AV Area (Vmax):    2.59 cm     PV Vmax:       1.14 m/s AV Area (Vmean):   2.74 cm     PV Vmean:      80.900 cm/s AV Area (VTI):     2.80 cm     PV VTI:        0.252 m AV Vmax:           167.00 cm/s  PV Peak grad:  5.2 mmHg AV Vmean:          103.000 cm/s PV Mean grad:  3.0 mmHg AV VTI:            0.276 m AV Peak Grad:      11.2 mmHg AV Mean Grad:      5.0 mmHg LVOT Vmax:         104.00 cm/s LVOT Vmean:        67.900 cm/s LVOT VTI:          0.186 m LVOT/AV VTI ratio: 0.67  AORTA Ao Root diam: 4.20 cm MITRAL VALVE                TRICUSPID VALVE MV Area (PHT): 3.94 cm     TR Peak grad:   38.9 mmHg MV Peak grad:  7.3 mmHg     TR Vmax:        312.00 cm/s MV  Mean grad:  2.0 mmHg MV Vmax:       1.35 m/s     SHUNTS MV Vmean:      61.1 cm/s    Systemic VTI:  0.19 m MV Decel Time: 193 msec     Systemic Diam: 2.30 cm MV E velocity: 122.50 cm/s Serafina Royals MD Electronically signed by Serafina Royals MD Signature Date/Time: 08/21/2019/5:42:42 PM    Final    CUP PACEART REMOTE DEVICE CHECK  Result Date: 08/16/2019 Scheduled remote reviewed. Normal device function.  Battery estimated 1 month to ERI, wireless transmitting. Next remote 91 days. [place]   EKG: Atrial fibrillation with paced rhythm  Weights: Filed Weights   08/18/19 1346 08/19/19 0224  Weight: 89.5 kg 92.2 kg     Physical Exam: Blood pressure 132/72, pulse 85, temperature 97.8 F (36.6 C), temperature source Axillary, resp. rate 18, height 6\' 2"  (1.88 m), weight 92.2 kg, SpO2 97 %. Body mass index is 26.1 kg/m. General: Well developed, well nourished, in no acute distress. Head eyes ears nose throat: Normocephalic, atraumatic, sclera non-icteric, no xanthomas, nares are without discharge. No apparent thyromegaly and/or mass  Lungs: Normal respiratory effort.  no wheezes, no rales, no rhonchi.  Heart: Irregular with normal S1 S2. no murmur gallop, no rub, PMI is normal size and placement, carotid upstroke normal without bruit, jugular venous pressure  is normal Abdomen: Soft, non-tender, non-distended with normoactive bowel sounds. No hepatomegaly. No rebound/guarding. No obvious abdominal masses. Abdominal aorta is normal size without bruit Extremities: Trace edema. no cyanosis, no clubbing, no ulcers  Peripheral : 2+ bilateral upper extremity pulses, 2+ bilateral femoral pulses, 2+ bilateral dorsal pedal pulse Neuro: Alert and oriented. No facial asymmetry. No focal deficit. Moves all extremities spontaneously. Musculoskeletal: Normal muscle tone without kyphosis Psych:  Responds to questions appropriately with a normal affect.    Assessment: 85 year old male with coronary disease  status post coronary bypass graft cardiomyopathy chronic atrial fibrillation status post pacemaker placement with AICD and no endocarditis in the past by transesophageal echocardiogram but recurrent evidence of Streptococcus bacteremia needing lead extraction due to recurrent episodes of bacteremia and reassessment by transesophageal echocardiogram after extraction rather than need for before.  Plan: Continue antibiotics for bacteremia likely secondary to pacer defibrillator lead and needing extraction 2.  Hold on anticoagulation due to concerns of AVM bleeding and recent procedure with upper endoscopy 3.  Transfer for lead extraction and further treatment thereof as after above  Signed, Corey Skains M.D. [facility] 08/22/2019, 6:37 PM

## 2019-08-22 NOTE — Op Note (Signed)
[facility] Gastroenterology Patient Name: Thomas [name] Procedure Date: 08/22/2019 4:15 PM MRN: [id number] Account #: [id number] Date of Birth: September 09, 1934 Admit Type: Outpatient Age: 85 Room: [facility] ENDO ROOM 1 Gender: Male Note Status: Finalized Procedure:             Small bowel enteroscopy Indications:           Iron deficiency anemia secondary to chronic blood                         loss, Arteriovenous malformation in the small intestine Providers:             Lin Landsman MD, MD Medicines:             Monitored Anesthesia Care Complications:         No immediate complications. Estimated blood loss: None. Procedure:             Pre-Anesthesia Assessment:                        - Prior to the procedure, a History and Physical was                         performed, and patient medications and allergies were                         reviewed. The patient is competent. The risks and                         benefits of the procedure and the sedation options and                         risks were discussed with the patient. All questions                         were answered and informed consent was obtained.                         Patient identification and proposed procedure were                         verified by the physician, the nurse, the                         anesthesiologist, the anesthetist and the technician                         in the pre-procedure area in the procedure room in the                         endoscopy suite. Mental Status Examination: alert and                         oriented. Airway Examination: normal oropharyngeal                         airway and neck mobility. Respiratory Examination:                         clear  to auscultation. CV Examination: normal.                         Prophylactic Antibiotics: The patient does not require                         prophylactic antibiotics. Prior Anticoagulants: The                          patient has taken no previous anticoagulant or                         antiplatelet agents. ASA Grade Assessment: III - A                         patient with severe systemic disease. After reviewing                         the risks and benefits, the patient was deemed in                         satisfactory condition to undergo the procedure. The                         anesthesia plan was to use monitored anesthesia care                         (MAC). Immediately prior to administration of                         medications, the patient was re-assessed for adequacy                         to receive sedatives. The heart rate, respiratory                         rate, oxygen saturations, blood pressure, adequacy of                         pulmonary ventilation, and response to care were                         monitored throughout the procedure. The physical                         status of the patient was re-assessed after the                         procedure.                        After obtaining informed consent, the endoscope was                         passed under direct vision. Throughout the procedure,                         the patient's blood pressure, pulse, and oxygen  saturations were monitored continuously. The                         Enteroscope was introduced through the mouth and                         advanced to the proximal jejunum. The small bowel                         enteroscopy was accomplished without difficulty. The                         patient tolerated the procedure well. Findings:      There was no evidence of significant pathology in the proximal jejunum.      There was no evidence of significant pathology in the entire examined       duodenum.      The esophagus was normal.      The stomach was normal. Impression:            - The examined portion of the jejunum was normal.                        - Normal examined  duodenum.                        - Normal esophagus.                        - Normal stomach.                        - No specimens collected. Recommendation:        - Return patient to hospital ward for ongoing care.                        - Resume previous diet today. Procedure Code(s):     --- Professional ---                        [phone], Small intestinal endoscopy, enteroscopy beyond                         second portion of duodenum, not including ileum;                         diagnostic, including collection of specimen(s) by                         brushing or washing, when performed (separate                         procedure) Diagnosis Code(s):     --- Professional ---                        D50.0, Iron deficiency anemia secondary to blood loss                         (chronic)  K31.819, Angiodysplasia of stomach and duodenum                         without bleeding CPT copyright 2019 American Medical Association. All rights reserved. The codes documented in this report are preliminary and upon coder review may  be revised to meet current compliance requirements. Dr. Ulyess Mort Lin Landsman MD, MD 08/22/2019 4:52:42 PM This report has been signed electronically. Number of Addenda: 0 Note Initiated On: 08/22/2019 4:15 PM Estimated Blood Loss:  Estimated blood loss: none.      [facility]

## 2019-08-22 NOTE — TOC Progression Note (Addendum)
Transition of Care [facility]) - Progression Note    Patient Details  Name: Thomas [name] MRN: [id number] Date of Birth: Sep 20, 1934  Transition of Care [facility]) CM/SW [place], LCSW Phone Number: 08/22/2019, 8:45 AM  Clinical Narrative:   CSW was informed by [facility] Representative Brittney that due to staffing, the earliest they could see him would be next week. Reached out to other [place] agencies to see if they can accommodate patient sooner. [facility], [facility], [facility], [facility] unable to provide coverage at this time. [facility] is reviewing but unsure if they can accommodate patient at this time [facility]).   11:30- Spoke to Dr. Roosevelt Locks who reported patient is now not expected to be medically ready for discharge for 3-4 days, needs Cardio follow up. Asked [facility] Representative Brittney to have patient on their list to start [place] next week so we can ensure patient has coverage if he is discharged next week. Brittney reported they can provide RN, PT, and OT. Asked MD to put in orders to hold patient's spot per Brittney.  Will continue to follow.     Expected Discharge Plan: [place] Barriers to Discharge: Continued Medical Work up  Expected Discharge Plan and Services Expected Discharge Plan: [place] arrangements for the past 2 months: [place] Agency: [facility] Date [place]: 08/21/19   Representative spoke with at [place]: [place] ([place]) Interventions    Readmission Risk Interventions Readmission Risk Prevention Plan 08/21/2019 06/23/2019 06/20/2019  Transportation Screening Complete Complete Complete  PCP or Specialist Appt within 3-5 Days - Complete -  [place] or [place] - Complete Complete  Social Work Consult for [place] Planning/Counseling - Complete -  Palliative  Care Screening - Not Applicable Not Applicable  Medication Review Press photographer) Complete Complete Complete  PCP or Specialist appointment within 3-5 days of discharge Complete - -  [place] or Home Care Consult Complete - -  [place] Complete - -  Some recent data might be hidden

## 2019-08-22 NOTE — Progress Notes (Signed)
  Speech Language Pathology Treatment: Dysphagia  Patient Details Name: Thomas [name] MRN: [id number] DOB: 13-Jun-1934 Today's Date: 08/22/2019 Time: 0900-0920 SLP Time Calculation (min) (ACUTE ONLY): 20 min  Assessment / Plan / Recommendation Clinical Impression  Skilled treatment session targeted pt's dysphagia goals. Skilled observation was provided of pt consuming dysphagia 3 diet textures with thin liquids via straw. This diet continues to be stable for pt. As he is free of overt s/s of aspiration or dysphagia. Given pt's current level of respiratory compromise and comorbidity of GI issues this is the least restrictive diet for pt. As such, ST intervention is no longer indicated. Please reconsult if pt's condition changes.    HPI HPI: Pt is 85 y/o male with a PMH of CABG (1986), Prostate Cancer, GERD, Pacemaker, Paroxysmal Atrial Fibrillation, Moderate Tricuspid Regurgitation, Ischemic Cardiomyopathy, HTN, Hydronephrosis with Ureteropelvic Junction Obstruction, Epididymitis, Bleeding Peptic Ulcer, Abdominal Aortic Aneurysm, Deafness, CAD, Stage III CKD, CHF, and AICD.  He presented to [facility] ER on 06/4 with weakness (onset 06/3) and fever (temp 101.3 the morning of 06/4).  Pt also endorsed some shortness of breath and a mild cough. Pt to ICU d/t hypotension requiring Levo gtt. Pt adm d/t HCAP.  Noted Upper Endoscopy in 06/2019 revealing Gastric mucosal atrophy. CXR 08/18/2019: Cardiomegaly. Airspace opacity consistent with pneumonia left base; small effusions.      SLP Plan  Discharge SLP treatment due to (comment)(pt stable on current diet)       Recommendations  Diet recommendations: Dysphagia 3 (mechanical soft);Thin liquid Liquids provided via: Cup;Straw Medication Administration: Whole meds with puree Supervision: Patient able to self feed Compensations: Minimize environmental distractions;Slow rate;Small sips/bites Postural Changes and/or Swallow Maneuvers: Seated upright 90 degrees                 Oral Care Recommendations: Oral care BID;Oral care before and after PO;Patient independent with oral care Follow up Recommendations: None SLP Visit Diagnosis: Dysphagia, unspecified (R13.10) Plan: Discharge SLP treatment due to (comment)(pt stable on current diet)       GO             Julias Mould B. Rutherford Nail M.S., CCC-SLP, [place] Office [phone]    Stormy Fabian 08/22/2019, 2:54 PM

## 2019-08-22 NOTE — Transfer of Care (Signed)
Immediate Anesthesia Transfer of Care Note  Patient: Thomas [name]  Procedure(s) Performed: ENTEROSCOPY (N/A )  Patient Location: PACU and Endoscopy Unit  Anesthesia Type:General  Level of Consciousness: awake, alert  and oriented  Airway & Oxygen Therapy: Patient Spontanous Breathing and Patient connected to nasal cannula oxygen  Post-op Assessment: Report given to RN and Post -op Vital signs reviewed and stable  Post vital signs: Reviewed and stable  Last Vitals:  Vitals Value Taken Time  BP 90/44 08/22/19 1657  Temp    Pulse 57 08/22/19 1705  Resp 17 08/22/19 1705  SpO2 98 % 08/22/19 1705  Vitals shown include unvalidated device data.  Last Pain:  Vitals:   08/22/19 1536  TempSrc:   PainSc: 0-No pain      Patients Stated Pain Goal: 0 (26/33/35 4562)  Complications: No apparent anesthesia complications

## 2019-08-22 NOTE — Anesthesia Preprocedure Evaluation (Addendum)
Anesthesia Evaluation  Patient identified by MRN, date of birth, ID band Patient awake    Reviewed: Allergy & Precautions, H&P , NPO status , Patient's Chart, lab work & pertinent test results  Airway Mallampati: II  TM Distance: >3 FB Neck ROM: full    Dental  (+) Chipped   Pulmonary neg pulmonary ROS, former smoker,    Pulmonary exam normal        Cardiovascular hypertension, (-) angina+ CAD, + Past MI, + CABG and +CHF  Normal cardiovascular exam+ dysrhythmias + pacemaker + Cardiac Defibrillator   1. Left ventricular ejection fraction, by estimation, is 55 to 60%. The  left ventricle has normal function. The left ventricle has no regional  wall motion abnormalities. Left ventricular diastolic parameters were  normal.  2. Right ventricular systolic function is normal. The right ventricular  size is normal. There is moderately elevated pulmonary artery systolic  pressure.  3. Left atrial size was mildly dilated.  4. Right atrial size was mildly dilated.  5. The mitral valve is normal in structure. Moderate mitral valve  regurgitation.  6. Tricuspid valve regurgitation is moderate.  7. The aortic valve is normal in structure. Aortic valve regurgitation is  trivial.    Neuro/Psych CVA negative psych ROS   GI/Hepatic Neg liver ROS, PUD, GERD  ,  Endo/Other  negative endocrine ROS  Renal/GU CRFRenal disease  negative genitourinary   Musculoskeletal   Abdominal   Peds  Hematology  (+) Blood dyscrasia, anemia ,   Anesthesia Other Findings Past Medical History: No date: AICD (automatic cardioverter/defibrillator) present 03/24/2005: Biventricular ICD (implantable cardioverter- defibrillator) in place     Comment:  Implantation of a [employer]onic Adapta ADDRO1, serial number              [id number] No date: CHF (congestive heart failure) (HCC) No date: CKD (chronic kidney disease), stage III No date: Coronary  artery disease     Comment:  a. s/p CABG 1986. b. Multiple PCIs/caths. c. 09/2013: s/p              PTCA and BMS to SVG-OM. No date: Deaf No date: Dysrhythmia No date: History of abdominal aortic aneurysm 1980: History of bleeding peptic ulcer 2013: History of epididymitis No date: HTN (hypertension) No date: Hydronephrosis with ureteropelvic junction obstruction 2009: Hydroureter on left No date: Hypertension No date: Ischemic cardiomyopathy     Comment:  a. Prior EF 30-35%, s/p BIV-ICD. b. 09/2013: EF 45-50%. No date: Moderate tricuspid regurgitation No date: PAF (paroxysmal atrial fibrillation) (HCC) No date: Presence of permanent cardiac pacemaker No date: Prostate cancer ([place] 1986: Status post coronary artery bypass grafting     Comment:  LIMA to the LAD, SVG to OM, SVG to RCA No date: Testicular swelling  Past Surgical History: 11/20/2011: 2-D echocardiogram     Comment:  Ejection fraction 30-35% moderate concentric left               ventricular hypertrophy. Left atrium is moderately               dilated. Mild MR. Mild or 12/16/2012: BI-VENTRICULAR IMPLANTABLE CARDIOVERTER DEFIBRILLATOR; N/A     Comment:  Procedure: BI-VENTRICULAR IMPLANTABLE CARDIOVERTER               DEFIBRILLATOR  (CRT-D);  Surgeon: [name], MD;                Location: [facility] CATH LAB;  Service: Cardiovascular;  Laterality: N/A; 12/10/2011: CARDIAC CATHETERIZATION     Comment:  SVG to OM widely patent.  LIMA to LAD patent 10/12/2017: CATARACT EXTRACTION W/PHACO; Right     Comment:  Procedure: CATARACT EXTRACTION PHACO AND INTRAOCULAR               LENS PLACEMENT (IOC);  Surgeon: Birder Robson, MD;                Location: [facility] ORS;  Service: Ophthalmology;  Laterality:              Right;  [place] 00:57 AP% 15.9 CDE 9.07 Fluid pack lot #               [id number] 07/13/2018: COLONOSCOPY; N/A     Comment:  Procedure: COLONOSCOPY;  Surgeon: Toledo, Benay Pike, MD;              Location:  [facility] ENDOSCOPY;  Service: Gastroenterology;                Laterality: N/A; 1986: CORONARY ARTERY BYPASS GRAFT 09/14/2018: ENTEROSCOPY; N/A     Comment:  Procedure: ENTEROSCOPY;  Surgeon: Toledo, Benay Pike, MD;              Location: [facility] ENDOSCOPY;  Service: Gastroenterology;                Laterality: N/A;  symptomatic anemia, GI blood loss               anemia, melena, positive small bowel capsule endoscopy               showing source of bleeding  07/13/2018: ESOPHAGOGASTRODUODENOSCOPY; N/A     Comment:  Procedure: ESOPHAGOGASTRODUODENOSCOPY (EGD);  Surgeon:               Toledo, Benay Pike, MD;  Location: [facility] ENDOSCOPY;                Service: Gastroenterology;  Laterality: N/A; 09/14/2018: ESOPHAGOGASTRODUODENOSCOPY; N/A     Comment:  Procedure: ESOPHAGOGASTRODUODENOSCOPY (EGD);  Surgeon:               Toledo, Benay Pike, MD;  Location: [facility] ENDOSCOPY;                Service: Gastroenterology;  Laterality: N/A;  SIGN               LANAGUAGE INTERPRETER 06/21/2019: ESOPHAGOGASTRODUODENOSCOPY; N/A     Comment:  Procedure: ESOPHAGOGASTRODUODENOSCOPY (EGD);  Surgeon:               Toledo, Benay Pike, MD;  Location: [facility] ENDOSCOPY;                Service: Gastroenterology;  Laterality: N/A; 05/27/2018: ESOPHAGOGASTRODUODENOSCOPY (EGD) WITH PROPOFOL; N/A     Comment:  Procedure: ESOPHAGOGASTRODUODENOSCOPY (EGD) WITH               PROPOFOL;  Surgeon: Clarene Essex, MD;  Location: [place];  Service: Endoscopy;  Laterality: N/A; No date: INSERT / REPLACE / REMOVE PACEMAKER 12/10/2011: LEFT HEART CATHETERIZATION WITH CORONARY/GRAFT ANGIOGRAM;  N/A     Comment:  Procedure: LEFT HEART CATHETERIZATION WITH               Beatrix Fetters;  Surgeon: Sanda Klein, MD;                Location: [place] CATH LAB;  Service: Cardiovascular;  Laterality: N/A; 09/25/2013: LEFT HEART CATHETERIZATION WITH CORONARY/GRAFT ANGIOGRAM;  N/A     Comment:  Procedure: LEFT HEART  CATHETERIZATION WITH               Beatrix Fetters;  Surgeon: Blane Ohara, MD;              Location: [facility] CATH LAB;  Service: Cardiovascular;                Laterality: N/A; 05/06/2010: Persantine Myoview     Comment:  Post-rest ejection fraction 30%. No significant ischemia              demonstrated. Compared to previous study there is no               significant change. 06/22/2019: TEE WITHOUT CARDIOVERSION; N/A     Comment:  Procedure: TRANSESOPHAGEAL ECHOCARDIOGRAM (TEE);                Surgeon: Minna Merritts, MD;  Location: [facility] ORS;                Service: Cardiovascular;  Laterality: N/A; No date: TRANSURETHRAL RESECTION OF PROSTATE     Comment:  s/p  BMI    Body Mass Index: 26.10 kg/m      Reproductive/Obstetrics negative OB ROS                           Anesthesia Physical Anesthesia Plan  ASA: III  Anesthesia Plan: General   Post-op Pain Management:    Induction:   PONV Risk Score and Plan: Propofol infusion and TIVA  Airway Management Planned: Natural Airway and Nasal Cannula  Additional Equipment:   Intra-op Plan:   Post-operative Plan:   Informed Consent: I have reviewed the patients History and Physical, chart, labs and discussed the procedure including the risks, benefits and alternatives for the proposed anesthesia with the patient or authorized representative who has indicated his/her understanding and acceptance.     Dental Advisory Given  Plan Discussed with: Anesthesiologist, CRNA and Surgeon  Anesthesia Plan Comments:        Anesthesia Quick Evaluation

## 2019-08-22 NOTE — Progress Notes (Addendum)
PROGRESS NOTE    Thomas [name]  ESP:[id number] DOB: 07-Apr-1934 DOA: 08/18/2019 PCP: [name], MD   Follow-up on bacteremia.  Brief Narrative:   Patient is 85 year old male with history of essential hypertension, history of stroke, coronary disease, paroxysmal atrial fibrillation not on anticoagulation, AICD placement, prostate cancer, chronicsystoliccongestive heart failure, history of GI bleed who presents to the hospital with a fever, chills, cough and shortness of breath. He was admitted to the hospital on 6/4 with diagnosis of left basilar pneumonia. Overnight, patient developed significant hypotension, he was transferred to ICU for pressor.  Patient also has black tarry stool with a positive occult blood. Has been seen by GI, planning for small bowel enteroscopy. Patient also has a positive blood culture with group B streptococcus.  6/6.Patient is off pressor. Antibiotics is continued. Patient to be transferred to medical floor.    Assessment & Plan:   Principal Problem:   HCAP (healthcare-associated pneumonia) Active Problems:   CAD Status post coronary artery bypass grafting: 1995   Permanent atrial fibrillation ([place])   Benign essential HTN   Acid reflux   HLD (hyperlipidemia)   Stroke (cerebrum) (HCC)   Chronic systolic CHF (congestive heart failure) (HCC)   Iron deficiency anemia due to chronic blood loss   Severe sepsis with septic shock (HCC)   Acute renal failure superimposed on stage 3a chronic kidney disease (HCC)   Black stool   Iron deficiency anemia   Septicemia due to group B Streptococcus (HCC)   Acute blood loss anemia  #1.  Septic shock secondary to group B streptococcus septicemia secondary to healthcare associated pneumonia. Transthoracic echocardiogram did not show any vegetation.  Will obtain TEE.  Discussed with Dr. Saunders Revel from cardiology, TEE will be performed tomorrow.  We will also consider remove the AICD.  Appreciated ID consult.   Continue Rocephin.  2.  Healthcare associated pneumonia. As above.  3.  Acute blood loss anemia with iron deficient anemia. Patient received IV iron.  Continue IV Protonix. Patient has been evaluated by GI, pending work-up for small intestine bleeding.  Continue to follow hemoglobin and transfuse as needed.  Hemoglobin so far has been stable.  4.  Chronic systolic congestive heart failure. No evidence of exacerbation.  5.  Hypokalemia. Continue supplement.  Resume home dose Aldactone.  6.  Essential hypertension. Stable.  7.  History of stroke with permanent atrial fibrillation. Patient is not a candidate for anticoagulation due to acute bleeding and recurrent bleeding in the past.  8.  Acute kidney injury. Renal function has improved.    DVT prophylaxis:SCDs Code Status:Full Family Communication:None Disposition Plan:  Patient came from:Home  Anticipated d/c place:Home  Barriers to d/c OR conditions which need to be met to effect a safe d/c:   Consultants:  Critical care  Gastroenterology.  Cardiology  Procedures:None Antimicrobials: Rocephin.   Subjective: Seen patient with the help from sign language interpreter. Patient doing well today.  Does not have any short of breath or chest pain. He had a normal-looking bowel movement, no additional diarrhea.  Objective: Vitals:   08/22/19 0800 08/22/19 0900 08/22/19 1000 08/22/19 1100  BP: 120/71 116/63 113/64 116/62  Pulse: 72 (!) 59 (!) 54 (!) 58  Resp: (!) 28 17 (!) 23 (!) 25  Temp: (!) 97.5 F (36.4 C)     TempSrc: Oral     SpO2: 96% 98% 95% 95%  Weight:      Height:        Intake/Output Summary (Last  24 hours) at 08/22/2019 1157 Last data filed at 08/22/2019 0800 Gross per 24 hour  Intake 720 ml  Output 2000 ml  Net -1280 ml   Filed Weights   08/18/19 1346  08/19/19 0224  Weight: 89.5 kg 92.2 kg    Examination:  General exam: Appears calm and comfortable  Respiratory system: Clear to auscultation. Respiratory effort normal. Cardiovascular system: Irregular. No JVD, murmurs, rubs, gallops or clicks. No pedal edema. Gastrointestinal system: Abdomen is nondistended, soft and nontender. No organomegaly or masses felt. Normal bowel sounds heard. Central nervous system: Alert and oriented. No focal neurological deficits. Extremities: Symmetric Skin: No rashes, lesions or ulcers Psychiatry: Judgement and insight appear normal. Mood & affect appropriate.     Data Reviewed: I have personally reviewed following labs and imaging studies  CBC: Recent Labs  Lab 08/18/19 1353 08/18/19 2029 08/19/19 0650 08/19/19 0650 08/19/19 1206 08/19/19 1206 08/19/19 2244 08/19/19 2244 08/20/19 0446 08/20/19 0446 08/20/19 1322 08/20/19 2047 08/21/19 0504 08/21/19 1749 08/22/19 0320  WBC 10.9*   < > 14.4*  --  12.1*  --  9.4  --  9.2  --   --   --   --   --  7.1  NEUTROABS 9.4*  --   --   --   --   --   --   --   --   --   --   --   --   --  5.2  HGB 7.4*   < > 7.2*   < > 6.7*   < > 8.5*   < > 8.7*   < > 9.5* 9.1* 8.8* 9.3* 8.8*  HCT 24.4*   < > 23.9*  --  22.2*  --  26.9*  --  26.5*  --   --   --   --   --  29.4*  MCV 78.5*   < > 79.7*  --  79.3*  --  78.4*  --  77.3*  --   --   --   --   --  80.8  PLT 181   < > 170  --  144*  --  142*  --  131*  --   --   --   --   --  160   < > = values in this interval not displayed.   Basic Metabolic Panel: Recent Labs  Lab 08/18/19 1353 08/18/19 1353 08/18/19 2351 08/19/19 0227 08/20/19 0446 08/20/19 2119 08/21/19 0504 08/22/19 0320  NA 139  --   --  139 139  --  141 141  K 3.5   < >  --  3.5 2.9* 3.0* 3.3* 3.2*  CL 99  --   --  103 102  --  108 107  CO2 28  --   --  27 27  --  28 27  GLUCOSE 115*  --   --  139* 128*  --  103* 96  BUN 57*  --   --  67* 67*  --  48* 31*  CREATININE 2.34*  --   --   2.57* 1.95*  --  1.42* 1.07  CALCIUM 7.9*  --   --  7.4* 7.5*  --  7.8* 7.8*  MG  --   --  2.0  --   --   --  2.2 2.3   < > = values in this interval not displayed.   GFR: Estimated Creatinine Clearance: 61.9 mL/min (by C-G formula based on  SCr of 1.07 mg/dL). Liver Function Tests: Recent Labs  Lab 08/18/19 1353  AST 46*  ALT 18  ALKPHOS 87  BILITOT 1.1  PROT 4.9*  ALBUMIN 1.9*   No results for input(s): LIPASE, AMYLASE in the last 168 hours. No results for input(s): AMMONIA in the last 168 hours. Coagulation Profile: Recent Labs  Lab 08/18/19 2029  INR 1.6*   Cardiac Enzymes: No results for input(s): CKTOTAL, CKMB, CKMBINDEX, TROPONINI in the last 168 hours. BNP (last 3 results) No results for input(s): PROBNP in the last 8760 hours. HbA1C: No results for input(s): HGBA1C in the last 72 hours. CBG: No results for input(s): GLUCAP in the last 168 hours. Lipid Profile: No results for input(s): CHOL, HDL, LDLCALC, TRIG, CHOLHDL, LDLDIRECT in the last 72 hours. Thyroid Function Tests: No results for input(s): TSH, T4TOTAL, FREET4, T3FREE, THYROIDAB in the last 72 hours. Anemia Panel: Recent Labs    08/20/19 0446  FERRITIN 87  TIBC 155*  IRON 11*   Sepsis Labs: Recent Labs  Lab 08/18/19 1353 08/18/19 1353 08/18/19 1703 08/18/19 2029 08/18/19 2351 08/19/19 0650  PROCALCITON 52.90  --   --   --   --   --   LATICACIDVEN 2.3*   < > 3.6* 2.3* 2.2* 1.4   < > = values in this interval not displayed.    Recent Results (from the past 240 hour(s))  Blood Culture (routine x 2)     Status: Abnormal   Collection Time: 08/18/19  2:34 PM   Specimen: BLOOD  Result Value Ref Range Status   Specimen Description   Final    BLOOD BLOOD RIGHT HAND Performed at [facility], [address], [place], [place] [address]    Special Requests   Final    BOTTLES DRAWN AEROBIC AND ANAEROBIC Blood Culture adequate volume Performed at [facility], [address], [place], [place] [address]    Culture  Setup Time   Final    GRAM POSITIVE COCCI IN BOTH AEROBIC AND ANAEROBIC BOTTLES CRITICAL RESULT CALLED TO, READ BACK BY AND VERIFIED WITH: DAVID BESANTI 08/19/19 AT 0050 HS Performed at [facility], [place] [address], [place], [place] [address]    Culture GROUP B STREP(S.AGALACTIAE)ISOLATED (A)  Final   Report Status 08/21/2019 FINAL  Final   Organism ID, Bacteria GROUP B STREP(S.AGALACTIAE)ISOLATED  Final      Susceptibility   Group b strep(s.agalactiae)isolated - MIC*    CLINDAMYCIN >=1 RESISTANT Resistant     AMPICILLIN <=0.25 SENSITIVE Sensitive     ERYTHROMYCIN >=8 RESISTANT Resistant     VANCOMYCIN 0.5 SENSITIVE Sensitive     CEFTRIAXONE <=0.12 SENSITIVE Sensitive     LEVOFLOXACIN 1 SENSITIVE Sensitive     PENICILLIN Value in next row Sensitive      SENSITIVE0.06    * GROUP B STREP(S.AGALACTIAE)ISOLATED  Blood Culture ID Panel (Reflexed)     Status: Abnormal   Collection Time: 08/18/19  2:34 PM  Result Value Ref Range Status   Enterococcus species NOT DETECTED NOT DETECTED Final   Listeria monocytogenes NOT DETECTED NOT DETECTED Final   Staphylococcus species NOT DETECTED NOT DETECTED Final   Staphylococcus aureus (BCID) NOT DETECTED NOT DETECTED Final   Streptococcus species DETECTED (A) NOT DETECTED Final    Comment: CRITICAL RESULT CALLED TO, READ BACK BY AND VERIFIED WITH: DAVID BESANTI 08/19/19 AT 0050 HS    Streptococcus agalactiae DETECTED (A) NOT DETECTED Final    Comment: CRITICAL RESULT CALLED  TO, READ BACK BY AND VERIFIED WITH: DAVID BESANTI 08/19/19 AT 8676 HS    Streptococcus pneumoniae NOT DETECTED NOT DETECTED Final   Streptococcus pyogenes NOT DETECTED NOT DETECTED Final   Acinetobacter baumannii NOT DETECTED NOT DETECTED Final   Enterobacteriaceae species NOT DETECTED NOT DETECTED Final   Enterobacter cloacae complex NOT DETECTED NOT DETECTED Final   Escherichia coli NOT DETECTED NOT DETECTED Final   Klebsiella  oxytoca NOT DETECTED NOT DETECTED Final   Klebsiella pneumoniae NOT DETECTED NOT DETECTED Final   Proteus species NOT DETECTED NOT DETECTED Final   Serratia marcescens NOT DETECTED NOT DETECTED Final   Haemophilus influenzae NOT DETECTED NOT DETECTED Final   Neisseria meningitidis NOT DETECTED NOT DETECTED Final   Pseudomonas aeruginosa NOT DETECTED NOT DETECTED Final   Candida albicans NOT DETECTED NOT DETECTED Final   Candida glabrata NOT DETECTED NOT DETECTED Final   Candida krusei NOT DETECTED NOT DETECTED Final   Candida parapsilosis NOT DETECTED NOT DETECTED Final   Candida tropicalis NOT DETECTED NOT DETECTED Final    Comment: Performed at [facility], [address] [place], [place] [address]  Blood Culture (routine x 2)     Status: Abnormal   Collection Time: 08/18/19  2:44 PM   Specimen: BLOOD  Result Value Ref Range Status   Specimen Description   Final    BLOOD RIGHT ANTECUBITAL Performed at [facility], [address] [place], [place] [address]    Special Requests   Final    BOTTLES DRAWN AEROBIC AND ANAEROBIC Blood Culture adequate volume Performed at [facility], [address] [place], [place] [address]    Culture  Setup Time   Final    GRAM POSITIVE COCCI IN BOTH AEROBIC AND ANAEROBIC BOTTLES CRITICAL VALUE NOTED.  VALUE IS CONSISTENT WITH PREVIOUSLY REPORTED AND CALLED VALUE. Performed at [facility], [address] [place], [place] [address]    Culture (A)  Final    GROUP B STREP(S.AGALACTIAE)ISOLATED SUSCEPTIBILITIES PERFORMED ON PREVIOUS CULTURE WITHIN THE LAST 5 DAYS. Performed at [facility], [place] [address] [place],  [address]    Report Status 08/21/2019 FINAL  Final  SARS Coronavirus 2 by RT PCR (hospital order, performed in [facility] hospital lab) Nasopharyngeal Nasopharyngeal Swab     Status: None   Collection Time: 08/18/19  2:50 PM   Specimen: Nasopharyngeal Swab  Result Value Ref  Range Status   SARS Coronavirus 2 NEGATIVE NEGATIVE Final    Comment: (NOTE) SARS-CoV-2 target nucleic acids are NOT DETECTED. The SARS-CoV-2 RNA is generally detectable in upper and lower respiratory specimens during the acute phase of infection. The lowest concentration of SARS-CoV-2 viral copies this assay can detect is 250 copies / mL. A negative result does not preclude SARS-CoV-2 infection and should not be used as the sole basis for treatment or other patient management decisions.  A negative result may occur with improper specimen collection / handling, submission of specimen other than nasopharyngeal swab, presence of viral mutation(s) within the areas targeted by this assay, and inadequate number of viral copies (<250 copies / mL). A negative result must be combined with clinical observations, patient history, and epidemiological information. Fact Sheet for Patients:   StrictlyIdeas.no Fact Sheet for Healthcare Providers: BankingDealers.co.za This test is not yet approved or cleared  by the [place] FDA and has been authorized for detection and/or diagnosis of SARS-CoV-2 by FDA under an Emergency Use Authorization (EUA).  This EUA will remain in  effect (meaning this test can be used) for the duration of the COVID-19 declaration under Section 564(b)(1) of the Act, 21 U.S.C. section 360bbb-3(b)(1), unless the authorization is terminated or revoked sooner. Performed at [facility], [address] [place], [place] [address]   Culture, sputum-assessment     Status: None   Collection Time: 08/19/19  1:11 AM   Specimen: Sputum  Result Value Ref Range Status   Specimen Description SPUTUM  Final   Special Requests NONE  Final   Sputum evaluation   Final    Sputum specimen not acceptable for testing.  Please recollect.   C/CHARLIE FLEETWOOD AT 0626 08/19/19.PMF Performed at [facility], [address]  [place], [place] [address]    Report Status 08/19/2019 FINAL  Final  MRSA PCR Screening     Status: None   Collection Time: 08/19/19  1:30 AM   Specimen: Nasal Mucosa; Nasopharyngeal  Result Value Ref Range Status   MRSA by PCR NEGATIVE NEGATIVE Final    Comment:        The GeneXpert MRSA Assay (FDA approved for NASAL specimens only), is one component of a comprehensive MRSA colonization surveillance program. It is not intended to diagnose MRSA infection nor to guide or monitor treatment for MRSA infections. Performed at [facility], [address] [place], [place] [address]   Gastrointestinal Panel by PCR , Stool     Status: None   Collection Time: 08/19/19 12:56 PM   Specimen: Stool  Result Value Ref Range Status   Campylobacter species NOT DETECTED NOT DETECTED Final   Plesimonas shigelloides NOT DETECTED NOT DETECTED Final   Salmonella species NOT DETECTED NOT DETECTED Final   Yersinia enterocolitica NOT DETECTED NOT DETECTED Final   Vibrio species NOT DETECTED NOT DETECTED Final   Vibrio cholerae NOT DETECTED NOT DETECTED Final   Enteroaggregative E coli (EAEC) NOT DETECTED NOT DETECTED Final   Enteropathogenic E coli (EPEC) NOT DETECTED NOT DETECTED Final   Enterotoxigenic E coli (ETEC) NOT DETECTED NOT DETECTED Final   Shiga like toxin producing E coli (STEC) NOT DETECTED NOT DETECTED Final   Shigella/Enteroinvasive E coli (EIEC) NOT DETECTED NOT DETECTED Final   Cryptosporidium NOT DETECTED NOT DETECTED Final   Cyclospora cayetanensis NOT DETECTED NOT DETECTED Final   Entamoeba histolytica NOT DETECTED NOT DETECTED Final   Giardia lamblia NOT DETECTED NOT DETECTED Final   Adenovirus F40/41 NOT DETECTED NOT DETECTED Final   Astrovirus NOT DETECTED NOT DETECTED Final   Norovirus GI/GII NOT DETECTED NOT DETECTED Final   Rotavirus A NOT DETECTED NOT DETECTED Final   Sapovirus (I, II, IV, and V) NOT DETECTED NOT DETECTED Final    Comment: Performed at [facility], [address] [place], [place] [address]  Urine culture     Status: None   Collection Time: 08/20/19  9:24 AM   Specimen: In/Out Cath Urine  Result Value Ref Range Status   Specimen Description   Final    IN/OUT CATH URINE Performed at [facility], [address], [place], [place] [address]    Special Requests   Final    NONE Performed at [facility], [address], [place], [place] [address]    Culture   Final    NO GROWTH Performed at [facility], [address] [place], [place] [address]    Report Status 08/21/2019 FINAL  Final  C Difficile Quick Screen w PCR reflex     Status:  None   Collection Time: 08/20/19 12:01 PM   Specimen: STOOL  Result Value Ref Range Status   C Diff antigen NEGATIVE NEGATIVE Final   C Diff toxin NEGATIVE NEGATIVE Final   C Diff interpretation No C. difficile detected.  Final    Comment: Performed at [facility], [address] [place], [place] [address]  CULTURE, BLOOD (ROUTINE X 2) w Reflex to ID Panel     Status: None (Preliminary result)   Collection Time: 08/20/19  1:07 PM   Specimen: BLOOD  Result Value Ref Range Status   Specimen Description BLOOD [facility]  Final   Special Requests   Final    BOTTLES DRAWN AEROBIC AND ANAEROBIC Blood Culture adequate volume   Culture   Final    NO GROWTH 2 DAYS Performed at [facility], [address], [place], [place] [address]    Report Status PENDING  Incomplete  CULTURE, BLOOD (ROUTINE X 2) w Reflex to ID Panel     Status: None (Preliminary result)   Collection Time: 08/20/19  1:22 PM   Specimen: BLOOD  Result Value Ref Range Status   Specimen Description BLOOD [facility]  Final   Special Requests   Final    BOTTLES DRAWN AEROBIC AND ANAEROBIC Blood Culture adequate volume   Culture   Final    NO GROWTH 2 DAYS Performed at [facility], [address] [place], [place] [address]    Report Status PENDING  Incomplete          Radiology Studies: ECHOCARDIOGRAM COMPLETE  Result Date: 08/21/2019    ECHOCARDIOGRAM REPORT   Patient Name:   CLEM WISENBAKER Date of Exam: 08/21/2019 Medical Rec #:  [id number]      Height:       74.0 in Accession #:    [id number]     Weight:       203.3 lb Date of Birth:  01-Aug-1934     BSA:          2.188 m Patient Age:    19 years       BP:           121/57 mmHg Patient Gender: M              HR:           61 bpm. Exam Location:  [facility] Procedure: 2D Echo, Color Doppler and Cardiac Doppler Indications:     R78.81 Bacteremia  History:         Patient has prior history of Echocardiogram examinations, most                  recent 06/22/2019. ICM and CHF, Pacemaker, CKD; Risk                  Factors:Hypertension.  Sonographer:     Charmayne Sheer RDCS (AE) Referring Phys:  [id number] Sharen Hones Diagnosing Phys: Serafina Royals MD IMPRESSIONS  1. Left ventricular ejection fraction, by estimation, is 55 to 60%. The left ventricle has normal function. The left ventricle has no regional wall motion abnormalities. Left ventricular diastolic parameters were normal.  2. Right ventricular systolic function is normal. The right ventricular size is normal. There is moderately elevated pulmonary artery systolic pressure.  3. Left atrial size was mildly dilated.  4. Right atrial size was mildly dilated.  5. The mitral valve is normal in structure. Moderate mitral valve regurgitation.  6. Tricuspid valve regurgitation is moderate.  7. The  aortic valve is normal in structure. Aortic valve regurgitation is trivial. FINDINGS  Left Ventricle: Left ventricular ejection fraction, by estimation, is 55 to 60%. The left ventricle has normal function. The left ventricle has no regional wall motion abnormalities. The left ventricular internal cavity size was normal in size. There is  no left ventricular hypertrophy. Left ventricular diastolic parameters were normal. Right Ventricle: The right ventricular size is normal. No increase in  right ventricular wall thickness. Right ventricular systolic function is normal. There is moderately elevated pulmonary artery systolic pressure. The tricuspid regurgitant velocity is 3.12 m/s, and with an assumed right atrial pressure of 10 mmHg, the estimated right ventricular systolic pressure is 41.2 mmHg. Left Atrium: Left atrial size was mildly dilated. Right Atrium: Right atrial size was mildly dilated. Pericardium: There is no evidence of pericardial effusion. Mitral Valve: The mitral valve is normal in structure. Moderate mitral valve regurgitation. MV peak gradient, 7.3 mmHg. The mean mitral valve gradient is 2.0 mmHg. Tricuspid Valve: The tricuspid valve is normal in structure. Tricuspid valve regurgitation is moderate. Aortic Valve: The aortic valve is normal in structure. Aortic valve regurgitation is trivial. Aortic valve mean gradient measures 5.0 mmHg. Aortic valve peak gradient measures 11.2 mmHg. Aortic valve area, by VTI measures 2.80 cm. Pulmonic Valve: The pulmonic valve was normal in structure. Pulmonic valve regurgitation is trivial. Aorta: The aortic root and ascending aorta are structurally normal, with no evidence of dilitation. IAS/Shunts: No atrial level shunt detected by color flow Doppler.  LEFT VENTRICLE PLAX 2D LVIDd:         6.16 cm      Diastology LVIDs:         5.53 cm      LV e' lateral:   13.40 cm/s LV PW:         1.11 cm      LV E/e' lateral: 9.1 LV IVS:        0.88 cm      LV e' medial:    7.29 cm/s LVOT diam:     2.30 cm      LV E/e' medial:  16.8 LV SV:         77 LV SV Index:   35 LVOT Area:     4.15 cm  LV Volumes (MOD) LV vol d, MOD A2C: 163.0 ml LV vol d, MOD A4C: 167.0 ml LV vol s, MOD A2C: 74.6 ml LV vol s, MOD A4C: 77.1 ml LV SV MOD A2C:     88.4 ml LV SV MOD A4C:     167.0 ml LV SV MOD BP:      98.1 ml RIGHT VENTRICLE RV Basal diam:  4.69 cm LEFT ATRIUM              Index       RIGHT ATRIUM           Index LA diam:        6.20 cm  2.83 cm/m  RA Area:     34.40 cm  LA Vol (A2C):   109.0 ml 49.81 ml/m RA Volume:   123.00 ml 56.20 ml/m LA Vol (A4C):   130.0 ml 59.40 ml/m LA Biplane Vol: 119.0 ml 54.38 ml/m  AORTIC VALVE                    PULMONIC VALVE AV Area (Vmax):    2.59 cm     PV Vmax:       1.14 m/s  AV Area (Vmean):   2.74 cm     PV Vmean:      80.900 cm/s AV Area (VTI):     2.80 cm     PV VTI:        0.252 m AV Vmax:           167.00 cm/s  PV Peak grad:  5.2 mmHg AV Vmean:          103.000 cm/s PV Mean grad:  3.0 mmHg AV VTI:            0.276 m AV Peak Grad:      11.2 mmHg AV Mean Grad:      5.0 mmHg LVOT Vmax:         104.00 cm/s LVOT Vmean:        67.900 cm/s LVOT VTI:          0.186 m LVOT/AV VTI ratio: 0.67  AORTA Ao Root diam: 4.20 cm MITRAL VALVE                TRICUSPID VALVE MV Area (PHT): 3.94 cm     TR Peak grad:   38.9 mmHg MV Peak grad:  7.3 mmHg     TR Vmax:        312.00 cm/s MV Mean grad:  2.0 mmHg MV Vmax:       1.35 m/s     SHUNTS MV Vmean:      61.1 cm/s    Systemic VTI:  0.19 m MV Decel Time: 193 msec     Systemic Diam: 2.30 cm MV E velocity: 122.50 cm/s Serafina Royals MD Electronically signed by Serafina Royals MD Signature Date/Time: 08/21/2019/5:42:42 PM    Final         Scheduled Meds: . Chlorhexidine Gluconate Cloth  6 each Topical Daily  . ferrous sulfate  325 mg Oral Daily  . multivitamin with minerals  1 tablet Oral Daily  . pantoprazole (PROTONIX) IV  40 mg Intravenous Q12H  . potassium chloride  40 mEq Oral Once  . simvastatin  20 mg Oral Daily  . spironolactone  25 mg Oral Daily   Continuous Infusions: . sodium chloride 250 mL (08/22/19 0136)  . cefTRIAXone (ROCEPHIN)  IV 2 g (08/22/19 0136)     LOS: 4 days    Time spent:35 minutes    Sharen Hones, MD [facility]   To contact the attending provider between 7A-7P or the covering provider during after hours 7P-7A, please log into the web site [email or web address] and access using universal [place] password for that web site. If you do not have the  password, please call the hospital operator.  08/22/2019, 11:57 AM

## 2019-08-22 NOTE — Progress Notes (Signed)
Patient went down for endoscopy at this time.

## 2019-08-22 NOTE — Progress Notes (Signed)
Physical Therapy Treatment Patient Details Name: Thomas [name] MRN: [id number] DOB: 07-26-1934 Today's Date: 08/22/2019    History of Present Illness Pt is 85 y/o male with a PMH of CABG (1986), Prostate Cancer, Pacemaker, Paroxysmal Atrial Fibrillation, Moderate Tricuspid Regurgitation, Ischemic Cardiomyopathy, HTN, Hydronephrosis with Ureteropelvic Junction Obstruction, Epididymitis, Bleeding Peptic Ulcer, Abdominal Aortic Aneurysm, Deafness, CAD, Stage III CKD, CHF, and AICD.  He presented to [facility] ER on 06/4 with weakness (onset 06/3) and fever (temp 101.3 the morning of 06/4).  Pt also endorsed some shortness of breath and a mild cough. Pt to ICU d/t hypotension requiring Levo gtt. Pt adm d/t HCAP.    PT Comments    Pt pleasant and motivated to participate during the session.  ASL interpreter present throughout the session, Kirsten Corcoren from [employer].  Pt required physical assistance with bed mobility and transfers along with cueing for proper sequencing.  Pt was able to perform static standing and ambulation with a RW with only CGA for safety with pt ambulating with very slow, effortful steps a max of 2 x 6 feet with therapeutic rest breaks between walks.  Pt's telemetry noted to show SpO2 into the high 70s or low 80s at times but always associated with a poor waveform with writer's portable pulsox showing low 90s, nursing notified.  Pt will benefit from PT services in a SNF setting upon discharge to safely address deficits listed in patient problem list for decreased caregiver assistance and eventual return to PLOF.    Follow Up Recommendations  SNF     Equipment Recommendations  Other (comment)(TBD at next venue of care)    Recommendations for Other Services       Precautions / Restrictions Precautions Precautions: Fall Restrictions Weight Bearing Restrictions: No    Mobility  Bed Mobility Overal bed mobility: Needs Assistance Bed Mobility: Supine to  Sit;Sit to Supine;Rolling Rolling: Mod assist   Supine to sit: Mod assist Sit to supine: Min assist   General bed mobility comments: Min to mod A with bed mobility tasks for both BLE and trunk control  Transfers Overall transfer level: Needs assistance Equipment used: Rolling walker (2 wheeled) Transfers: Sit to/from Stand Sit to Stand: From elevated surface;Min assist;Mod assist         General transfer comment: Min A from elevated surface and mod A from low surface with cues for hand placement  Ambulation/Gait Ambulation/Gait assistance: Min guard Gait Distance (Feet): 6 Feet x 2 Assistive device: Rolling walker (2 wheeled) Gait Pattern/deviations: Step-through pattern;Decreased step length - right;Decreased step length - left;Trunk flexed;Shuffle Gait velocity: decreased   General Gait Details: Slow, effortful steps but steady without LOB   Stairs             Wheelchair Mobility    Modified Rankin (Stroke Patients Only)       Balance Overall balance assessment: History of Falls;Needs assistance Sitting-balance support: Feet supported Sitting balance-Leahy Scale: Good     Standing balance support: Bilateral upper extremity supported Standing balance-Leahy Scale: Fair                              Cognition Arousal/Alertness: Awake/alert Behavior During Therapy: WFL for tasks assessed/performed Overall Cognitive Status: Within Functional Limits for tasks assessed  Exercises Other Exercises Other Exercises: Multiple sit to/from stands from multiple height surfaces with training for proper sequencing Other Exercises: Static standing for improved activity tolerance 2 x 2-3 minutes    General Comments        Pertinent Vitals/Pain Pain Assessment: No/denies pain    Home Living                      Prior Function            PT Goals (current goals can now be found in  the care plan section) Progress towards PT goals: Progressing toward goals    Frequency    Min 2X/week      PT Plan Current plan remains appropriate    Co-evaluation              AM-PAC PT "6 Clicks" Mobility   Outcome Measure  Help needed turning from your back to your side while in a flat bed without using bedrails?: A Lot Help needed moving from lying on your back to sitting on the side of a flat bed without using bedrails?: A Lot Help needed moving to and from a bed to a chair (including a wheelchair)?: A Lot Help needed standing up from a chair using your arms (e.g., wheelchair or bedside chair)?: A Lot Help needed to walk in hospital room?: A Lot Help needed climbing 3-5 steps with a railing? : Total 6 Click Score: 11    End of Session Equipment Utilized During Treatment: Gait belt Activity Tolerance: Patient tolerated treatment well Patient left: in bed;with bed alarm set;with call bell/phone within reach Nurse Communication: Mobility status;Other (comment)(SpO2 dropped to high 70s to low 80s at times but with poor waveform and with reading typically in the low 90s with portable pulsox) PT Visit Diagnosis: History of falling (Z91.81);Difficulty in walking, not elsewhere classified (R26.2);Muscle weakness (generalized) (M62.81);Unsteadiness on feet (R26.81)     Time: 1330-1403 PT Time Calculation (min) (ACUTE ONLY): 33 min  Charges:  $Gait Training: 8-22 mins $Therapeutic Activity: 8-22 mins                     D. Scott Kamaree Berkel PT, DPT 08/22/19, 3:22 PM

## 2019-08-22 NOTE — Telephone Encounter (Signed)
Sign language interpreter states the patient's wife, [name], wanted to make Dr. [name] aware that the patient is currently in the ICU.

## 2019-08-22 NOTE — Anesthesia Postprocedure Evaluation (Signed)
Anesthesia Post Note  Patient: Thomas [name]  Procedure(s) Performed: ENTEROSCOPY (N/A )  Patient location during evaluation: PACU Anesthesia Type: General Level of consciousness: awake and alert Pain management: pain level controlled Vital Signs Assessment: post-procedure vital signs reviewed and stable Respiratory status: spontaneous breathing, nonlabored ventilation and respiratory function stable Cardiovascular status: blood pressure returned to baseline and stable Postop Assessment: no apparent nausea or vomiting Anesthetic complications: no     Last Vitals:  Vitals:   08/22/19 1827 08/22/19 2041  BP:  118/82  Pulse:  70  Resp:  (!) 24  Temp: 36.6 C 36.7 C  SpO2:  91%    Last Pain:  Vitals:   08/22/19 2041  TempSrc: Oral  PainSc:                  Tera Mater

## 2019-08-22 NOTE — Progress Notes (Signed)
Nurse from procedure room reported redness and raw looking skin on patients buttocks. I noticed redness on patients knees also. Red spots on bilateral arms and legs.  The left forearm red area about quarter size  swelling with blackish center. Red raw spot/ blister on left little toe

## 2019-08-22 NOTE — Progress Notes (Signed)
This patient was seen by [facility] cardiology group as outpatient. Dr. Saunders Revel will take over cardiology consult, planning for TEE tomorrow. Then he will give recommendation about ICD extraction.

## 2019-08-23 ENCOUNTER — Inpatient Hospital Stay (HOSPITAL_COMMUNITY)
Admit: 2019-08-23 | Discharge: 2019-08-23 | Disposition: A | Discharge status: Home / Self Care | Payer: PPO | Attending: Cardiology | Admitting: Cardiology

## 2019-08-23 ENCOUNTER — Encounter
Admission: EM | Disposition: A | Discharge status: Other Acute Inpatient Hospital | Payer: Self-pay | Source: Home / Self Care | Attending: Internal Medicine

## 2019-08-23 ENCOUNTER — Encounter: Payer: Self-pay | Admitting: *Deleted

## 2019-08-23 DIAGNOSIS — I5022 Chronic systolic (congestive) heart failure: Secondary | ICD-10-CM

## 2019-08-23 DIAGNOSIS — L89152 Pressure ulcer of sacral region, stage 2: Secondary | ICD-10-CM

## 2019-08-23 DIAGNOSIS — A401 Sepsis due to streptococcus, group B: Principal | ICD-10-CM

## 2019-08-23 DIAGNOSIS — I361 Nonrheumatic tricuspid (valve) insufficiency: Secondary | ICD-10-CM

## 2019-08-23 DIAGNOSIS — I351 Nonrheumatic aortic (valve) insufficiency: Secondary | ICD-10-CM

## 2019-08-23 DIAGNOSIS — I10 Essential (primary) hypertension: Secondary | ICD-10-CM

## 2019-08-23 DIAGNOSIS — I340 Nonrheumatic mitral (valve) insufficiency: Secondary | ICD-10-CM

## 2019-08-23 DIAGNOSIS — J189 Pneumonia, unspecified organism: Secondary | ICD-10-CM

## 2019-08-23 DIAGNOSIS — N179 Acute kidney failure, unspecified: Secondary | ICD-10-CM

## 2019-08-23 DIAGNOSIS — E876 Hypokalemia: Secondary | ICD-10-CM

## 2019-08-23 DIAGNOSIS — I442 Atrioventricular block, complete: Secondary | ICD-10-CM | POA: Insufficient documentation

## 2019-08-23 HISTORY — PX: TEE WITHOUT CARDIOVERSION: SHX5443

## 2019-08-23 LAB — BASIC METABOLIC PANEL
Anion gap: 5 (ref 5–15)
BUN: 20 mg/dL (ref 8–23)
CO2: 29 mmol/L (ref 22–32)
Calcium: 7.6 mg/dL — ABNORMAL LOW (ref 8.9–10.3)
Chloride: 111 mmol/L (ref 98–111)
Creatinine, Ser: 0.88 mg/dL (ref 0.61–1.24)
GFR calc Af Amer: >60 mL/min (ref >60)
GFR calc non Af Amer: >60 mL/min (ref >60)
Glucose, Bld: 86 mg/dL (ref 70–99)
Potassium: 3.5 mmol/L (ref 3.5–5.1)
Sodium: 145 mmol/L (ref 135–145)

## 2019-08-23 LAB — CBC WITH DIFFERENTIAL/PLATELET
Abs Immature Granulocytes: 0.1 10*3/uL — ABNORMAL HIGH (ref 0.00–0.07)
Basophils Absolute: 0 10*3/uL (ref 0.0–0.1)
Basophils Relative: 1 %
Eosinophils Absolute: 0.2 10*3/uL (ref 0.0–0.5)
Eosinophils Relative: 3 %
HCT: 28.8 % — ABNORMAL LOW (ref 39.0–52.0)
Hemoglobin: 9 g/dL — ABNORMAL LOW (ref 13.0–17.0)
Immature Granulocytes: 2 %
Lymphocytes Relative: 14 %
Lymphs Abs: 0.9 10*3/uL (ref 0.7–4.0)
MCH: 24.8 pg — ABNORMAL LOW (ref 26.0–34.0)
MCHC: 31.3 g/dL (ref 30.0–36.0)
MCV: 79.3 fL — ABNORMAL LOW (ref 80.0–100.0)
Monocytes Absolute: 0.8 10*3/uL (ref 0.1–1.0)
Monocytes Relative: 13 %
Neutro Abs: 4.2 10*3/uL (ref 1.7–7.7)
Neutrophils Relative %: 67 %
Platelets: 180 10*3/uL (ref 150–400)
RBC: 3.63 MIL/uL — ABNORMAL LOW (ref 4.22–5.81)
RDW: 18.8 % — ABNORMAL HIGH (ref 11.5–15.5)
WBC: 6.3 10*3/uL (ref 4.0–10.5)
nRBC: 0 % (ref 0.0–0.2)

## 2019-08-23 LAB — MAGNESIUM: Magnesium: 2.3 mg/dL (ref 1.7–2.4)

## 2019-08-23 SURGERY — ECHOCARDIOGRAM, TRANSESOPHAGEAL
Anesthesia: Moderate Sedation

## 2019-08-23 MED ORDER — ONDANSETRON HCL 4 MG/2ML IJ SOLN: 4.0000 mg | Freq: Three times a day (TID) | INTRAMUSCULAR | Status: DC | PRN

## 2019-08-23 MED ORDER — SODIUM CHLORIDE 0.9% FLUSH
3.0000 mL | Freq: Two times a day (BID) | INTRAVENOUS | Status: DC
  Administered 2019-08-25 – 2019-09-12 (×33): 3 mL via INTRAVENOUS

## 2019-08-23 MED ORDER — PANTOPRAZOLE SODIUM 40 MG IV SOLR: 40.0000 mg | Freq: Two times a day (BID) | INTRAVENOUS | Status: DC

## 2019-08-23 MED ORDER — SPIRONOLACTONE 25 MG PO TABS: 25.0000 mg | ORAL_TABLET | Freq: Every day | ORAL | Status: DC

## 2019-08-23 MED ORDER — SODIUM CHLORIDE 0.9 % IV SOLN: 2.0000 g | INTRAVENOUS | Status: DC

## 2019-08-23 MED ORDER — ACETAMINOPHEN 325 MG PO TABS
650.0000 mg | ORAL_TABLET | Freq: Four times a day (QID) | ORAL | Status: DC | PRN
  Administered 2019-08-26: 650 mg via ORAL
  Filled 2019-08-23 (×2): qty 2

## 2019-08-23 MED ORDER — PANTOPRAZOLE SODIUM 40 MG IV SOLR
40.0000 mg | Freq: Two times a day (BID) | INTRAVENOUS | Status: DC
  Administered 2019-08-25 – 2019-09-03 (×19): 40 mg via INTRAVENOUS
  Filled 2019-08-23 (×20): qty 40

## 2019-08-23 MED ORDER — ACETAMINOPHEN 325 MG PO TABS: 650.0000 mg | ORAL_TABLET | Freq: Four times a day (QID) | ORAL | Status: AC | PRN

## 2019-08-23 MED ORDER — ALBUTEROL SULFATE (2.5 MG/3ML) 0.083% IN NEBU: 3.0000 mL | INHALATION_SOLUTION | RESPIRATORY_TRACT | 12 refills | Status: AC | PRN

## 2019-08-23 MED ORDER — TORSEMIDE 20 MG PO TABS: 40.0000 mg | ORAL_TABLET | Freq: Every day | ORAL | 3 refills | Status: DC

## 2019-08-23 MED ORDER — DM-GUAIFENESIN ER 30-600 MG PO TB12
1.0000 | ORAL_TABLET | Freq: Two times a day (BID) | ORAL | Status: DC | PRN
  Administered 2019-08-26: 1 via ORAL
  Filled 2019-08-23 (×2): qty 1

## 2019-08-23 MED ORDER — SODIUM CHLORIDE FLUSH 0.9 % IV SOLN
INTRAVENOUS | Status: AC
  Filled 2019-08-23: qty 20

## 2019-08-23 MED ORDER — MIDAZOLAM HCL 5 MG/5ML IJ SOLN
INTRAMUSCULAR | Status: AC
  Filled 2019-08-23: qty 5

## 2019-08-23 MED ORDER — IPRATROPIUM-ALBUTEROL 0.5-2.5 (3) MG/3ML IN SOLN: 3.0000 mL | Freq: Four times a day (QID) | RESPIRATORY_TRACT | Status: DC

## 2019-08-23 MED ORDER — FENTANYL CITRATE (PF) 100 MCG/2ML IJ SOLN
INTRAMUSCULAR | Status: AC | PRN
  Administered 2019-08-23 (×3): 25 ug via INTRAVENOUS

## 2019-08-23 MED ORDER — FERROUS SULFATE 325 (65 FE) MG PO TABS
325.0000 mg | ORAL_TABLET | Freq: Every day | ORAL | Status: DC
  Administered 2019-08-26 – 2019-09-12 (×16): 325 mg via ORAL
  Filled 2019-08-23 (×18): qty 1

## 2019-08-23 MED ORDER — ADULT MULTIVITAMIN W/MINERALS CH
1.0000 | ORAL_TABLET | Freq: Every day | ORAL | Status: DC
  Administered 2019-08-26 – 2019-09-12 (×16): 1 via ORAL
  Filled 2019-08-23 (×17): qty 1

## 2019-08-23 MED ORDER — SODIUM CHLORIDE 0.9 % IV SOLN
2.0000 g | INTRAVENOUS | Status: DC
  Administered 2019-08-25 – 2019-09-11 (×19): 2 g via INTRAVENOUS
  Filled 2019-08-23 (×2): qty 2
  Filled 2019-08-23: qty 20
  Filled 2019-08-23: qty 2
  Filled 2019-08-23: qty 20
  Filled 2019-08-23 (×2): qty 2
  Filled 2019-08-23 (×2): qty 20
  Filled 2019-08-23 (×2): qty 2
  Filled 2019-08-23: qty 20
  Filled 2019-08-23: qty 2
  Filled 2019-08-23: qty 20
  Filled 2019-08-23 (×4): qty 2
  Filled 2019-08-23: qty 0.03
  Filled 2019-08-23: qty 20
  Filled 2019-08-23: qty 2
  Filled 2019-08-23: qty 20

## 2019-08-23 MED ORDER — MIDAZOLAM HCL 5 MG/5ML IJ SOLN
INTRAMUSCULAR | Status: AC | PRN
  Administered 2019-08-23 (×2): 1 mg via INTRAVENOUS

## 2019-08-23 MED ORDER — SIMVASTATIN 20 MG PO TABS
20.0000 mg | ORAL_TABLET | Freq: Every day | ORAL | Status: DC
  Administered 2019-08-26 – 2019-09-11 (×15): 20 mg via ORAL
  Filled 2019-08-23 (×18): qty 1

## 2019-08-23 MED ORDER — ALBUTEROL SULFATE (2.5 MG/3ML) 0.083% IN NEBU: 3.0000 mL | INHALATION_SOLUTION | RESPIRATORY_TRACT | Status: DC | PRN

## 2019-08-23 MED ORDER — LIDOCAINE VISCOUS HCL 2 % MT SOLN
OROMUCOSAL | Status: AC
  Filled 2019-08-23: qty 15

## 2019-08-23 MED ORDER — LIDOCAINE VISCOUS HCL 2 % MT SOLN
OROMUCOSAL | Status: AC | PRN
  Administered 2019-08-23: 15 mL via OROMUCOSAL

## 2019-08-23 MED ORDER — FENTANYL CITRATE (PF) 100 MCG/2ML IJ SOLN
INTRAMUSCULAR | Status: AC
  Filled 2019-08-23: qty 2

## 2019-08-23 MED ORDER — ONDANSETRON HCL 4 MG/2ML IJ SOLN: 4.0000 mg | Freq: Three times a day (TID) | INTRAMUSCULAR | 0 refills | Status: DC | PRN

## 2019-08-23 MED ORDER — IPRATROPIUM-ALBUTEROL 0.5-2.5 (3) MG/3ML IN SOLN
3.0000 mL | Freq: Four times a day (QID) | RESPIRATORY_TRACT | Status: DC
  Administered 2019-08-24: 3 mL via RESPIRATORY_TRACT
  Filled 2019-08-23: qty 3

## 2019-08-23 MED ORDER — IPRATROPIUM-ALBUTEROL 0.5-2.5 (3) MG/3ML IN SOLN
3.0000 mL | Freq: Four times a day (QID) | RESPIRATORY_TRACT | Status: DC
  Administered 2019-08-23 – 2019-08-24 (×4): 3 mL via RESPIRATORY_TRACT
  Filled 2019-08-23 (×3): qty 3

## 2019-08-23 MED ORDER — SODIUM CHLORIDE 0.9 % IV SOLN: INTRAVENOUS | Status: DC

## 2019-08-23 MED ORDER — DM-GUAIFENESIN ER 30-600 MG PO TB12: 1.0000 | ORAL_TABLET | Freq: Two times a day (BID) | ORAL | Status: DC | PRN

## 2019-08-23 NOTE — Plan of Care (Signed)
  Problem: Clinical Measurements: Goal: Ability to maintain clinical measurements within normal limits will improve Outcome: Progressing   Problem: Nutrition: Goal: Adequate nutrition will be maintained Outcome: Progressing   Problem: Activity: Goal: Risk for activity intolerance will decrease Outcome: Progressing

## 2019-08-23 NOTE — Progress Notes (Signed)
Progress Note  Patient Name: TARRENCE [name] Date of Encounter: 08/23/2019  [facility] Cardiologist: Sanda Klein, MD   Subjective   No acute events overnight. Denies fevers, cp or sob.  Inpatient Medications    Scheduled Meds: . fentaNYL      . [MAR Hold] ferrous sulfate  325 mg Oral Daily  . lidocaine      . midazolam      . [MAR Hold] multivitamin with minerals  1 tablet Oral Daily  . [MAR Hold] pantoprazole (PROTONIX) IV  40 mg Intravenous Q12H  . [MAR Hold] simvastatin  20 mg Oral Daily  . sodium chloride flush      . [MAR Hold] spironolactone  25 mg Oral Daily   Continuous Infusions: . sodium chloride 250 mL (08/23/19 0343)  . sodium chloride    . [MAR Hold] cefTRIAXone (ROCEPHIN)  IV 2 g (08/23/19 0343)   PRN Meds: [MAR Hold] acetaminophen, [MAR Hold] albuterol, [MAR Hold] dextromethorphan-guaiFENesin, [MAR Hold] ondansetron (ZOFRAN) IV   Vital Signs    Vitals:   08/23/19 0925 08/23/19 0930 08/23/19 0935 08/23/19 0940  BP: 113/62 (!) 115/59 114/60 110/75  Pulse: 65 64 60 62  Resp: 16  18 19   Temp:      TempSrc:      SpO2: 94% 94% 95% 93%  Weight:      Height:        Intake/Output Summary (Last 24 hours) at 08/23/2019 0943 Last data filed at 08/22/2019 2231 Gross per 24 hour  Intake 0 ml  Output 200 ml  Net -200 ml   Last 3 Weights 08/23/2019 08/19/2019 08/18/2019  Weight (lbs) 203 lb 4.2 oz 203 lb 4.2 oz 197 lb 4.8 oz  Weight (kg) 92.2 kg 92.2 kg 89.495 kg      Telemetry    Paced rhythm - Personally Reviewed  ECG    No new ekg obtained  Physical Exam   GEN: No acute distress.   Neck: No JVD Cardiac: RRR, faint systolic murmur Respiratory: coarse breath sounds GI: Soft, nontender, non-distended  MS: trace edema; No deformity. Neuro:  Nonfocal  Psych: Normal affect   Labs    High Sensitivity Troponin:  No results for input(s): TROPONINIHS in the last 720 hours.    Chemistry Recent Labs  Lab 08/18/19 1353 08/19/19 0227  08/21/19 0504 08/22/19 0320 08/23/19 0448  NA 139   < > 141 141 145  K 3.5   < > 3.3* 3.2* 3.5  CL 99   < > 108 107 111  CO2 28   < > 28 27 29   GLUCOSE 115*   < > 103* 96 86  BUN 57*   < > 48* 31* 20  CREATININE 2.34*   < > 1.42* 1.07 0.88  CALCIUM 7.9*   < > 7.8* 7.8* 7.6*  PROT 4.9*  --   --   --   --   ALBUMIN 1.9*  --   --   --   --   AST 46*  --   --   --   --   ALT 18  --   --   --   --   ALKPHOS 87  --   --   --   --   BILITOT 1.1  --   --   --   --   GFRNONAA 25*   < > 46* >60 >60  GFRAA 29*   < > 53* >60 >60  ANIONGAP 12   < >  5 7 5    < > = values in this interval not displayed.     Hematology Recent Labs  Lab 08/20/19 0446 08/20/19 1322 08/21/19 1749 08/22/19 0320 08/23/19 0448  WBC 9.2  --   --  7.1 6.3  RBC 3.43*  --   --  3.64* 3.63*  HGB 8.7*   < > 9.3* 8.8* 9.0*  HCT 26.5*  --   --  29.4* 28.8*  MCV 77.3*  --   --  80.8 79.3*  MCH 25.4*  --   --  24.2* 24.8*  MCHC 32.8  --   --  29.9* 31.3  RDW 18.6*  --   --  18.4* 18.8*  PLT 131*  --   --  160 180   < > = values in this interval not displayed.    BNP Recent Labs  Lab 08/18/19 1353  BNP 588.8*     DDimer No results for input(s): DDIMER in the last 168 hours.   Radiology    ECHOCARDIOGRAM COMPLETE  Result Date: 08/21/2019    ECHOCARDIOGRAM REPORT   Patient Name:   Thomas [name] Date of Exam: 08/21/2019 Medical Rec #:  [id number]      Height:       74.0 in Accession #:    [id number]     Weight:       203.3 lb Date of Birth:  1935-02-19     BSA:          2.188 m Patient Age:    85 years       BP:           121/57 mmHg Patient Gender: M              HR:           61 bpm. Exam Location:  [facility] Procedure: 2D Echo, Color Doppler and Cardiac Doppler Indications:     R78.81 Bacteremia  History:         Patient has prior history of Echocardiogram examinations, most                  recent 06/22/2019. ICM and CHF, Pacemaker, CKD; Risk                  Factors:Hypertension.  Sonographer:     Charmayne Sheer RDCS  (AE) Referring Phys:  [id number] Sharen Hones Diagnosing Phys: Serafina Royals MD IMPRESSIONS  1. Left ventricular ejection fraction, by estimation, is 55 to 60%. The left ventricle has normal function. The left ventricle has no regional wall motion abnormalities. Left ventricular diastolic parameters were normal.  2. Right ventricular systolic function is normal. The right ventricular size is normal. There is moderately elevated pulmonary artery systolic pressure.  3. Left atrial size was mildly dilated.  4. Right atrial size was mildly dilated.  5. The mitral valve is normal in structure. Moderate mitral valve regurgitation.  6. Tricuspid valve regurgitation is moderate.  7. The aortic valve is normal in structure. Aortic valve regurgitation is trivial. FINDINGS  Left Ventricle: Left ventricular ejection fraction, by estimation, is 55 to 60%. The left ventricle has normal function. The left ventricle has no regional wall motion abnormalities. The left ventricular internal cavity size was normal in size. There is  no left ventricular hypertrophy. Left ventricular diastolic parameters were normal. Right Ventricle: The right ventricular size is normal. No increase in right ventricular wall thickness. Right ventricular systolic function is normal. There is moderately elevated pulmonary  artery systolic pressure. The tricuspid regurgitant velocity is 3.12 m/s, and with an assumed right atrial pressure of 10 mmHg, the estimated right ventricular systolic pressure is 97.0 mmHg. Left Atrium: Left atrial size was mildly dilated. Right Atrium: Right atrial size was mildly dilated. Pericardium: There is no evidence of pericardial effusion. Mitral Valve: The mitral valve is normal in structure. Moderate mitral valve regurgitation. MV peak gradient, 7.3 mmHg. The mean mitral valve gradient is 2.0 mmHg. Tricuspid Valve: The tricuspid valve is normal in structure. Tricuspid valve regurgitation is moderate. Aortic Valve: The aortic  valve is normal in structure. Aortic valve regurgitation is trivial. Aortic valve mean gradient measures 5.0 mmHg. Aortic valve peak gradient measures 11.2 mmHg. Aortic valve area, by VTI measures 2.80 cm. Pulmonic Valve: The pulmonic valve was normal in structure. Pulmonic valve regurgitation is trivial. Aorta: The aortic root and ascending aorta are structurally normal, with no evidence of dilitation. IAS/Shunts: No atrial level shunt detected by color flow Doppler.  LEFT VENTRICLE PLAX 2D LVIDd:         6.16 cm      Diastology LVIDs:         5.53 cm      LV e' lateral:   13.40 cm/s LV PW:         1.11 cm      LV E/e' lateral: 9.1 LV IVS:        0.88 cm      LV e' medial:    7.29 cm/s LVOT diam:     2.30 cm      LV E/e' medial:  16.8 LV SV:         77 LV SV Index:   35 LVOT Area:     4.15 cm  LV Volumes (MOD) LV vol d, MOD A2C: 163.0 ml LV vol d, MOD A4C: 167.0 ml LV vol s, MOD A2C: 74.6 ml LV vol s, MOD A4C: 77.1 ml LV SV MOD A2C:     88.4 ml LV SV MOD A4C:     167.0 ml LV SV MOD BP:      98.1 ml RIGHT VENTRICLE RV Basal diam:  4.69 cm LEFT ATRIUM              Index       RIGHT ATRIUM           Index LA diam:        6.20 cm  2.83 cm/m  RA Area:     34.40 cm LA Vol (A2C):   109.0 ml 49.81 ml/m RA Volume:   123.00 ml 56.20 ml/m LA Vol (A4C):   130.0 ml 59.40 ml/m LA Biplane Vol: 119.0 ml 54.38 ml/m  AORTIC VALVE                    PULMONIC VALVE AV Area (Vmax):    2.59 cm     PV Vmax:       1.14 m/s AV Area (Vmean):   2.74 cm     PV Vmean:      80.900 cm/s AV Area (VTI):     2.80 cm     PV VTI:        0.252 m AV Vmax:           167.00 cm/s  PV Peak grad:  5.2 mmHg AV Vmean:          103.000 cm/s PV Mean grad:  3.0 mmHg AV VTI:  0.276 m AV Peak Grad:      11.2 mmHg AV Mean Grad:      5.0 mmHg LVOT Vmax:         104.00 cm/s LVOT Vmean:        67.900 cm/s LVOT VTI:          0.186 m LVOT/AV VTI ratio: 0.67  AORTA Ao Root diam: 4.20 cm MITRAL VALVE                TRICUSPID VALVE MV Area (PHT): 3.94  cm     TR Peak grad:   38.9 mmHg MV Peak grad:  7.3 mmHg     TR Vmax:        312.00 cm/s MV Mean grad:  2.0 mmHg MV Vmax:       1.35 m/s     SHUNTS MV Vmean:      61.1 cm/s    Systemic VTI:  0.19 m MV Decel Time: 193 msec     Systemic Diam: 2.30 cm MV E velocity: 122.50 cm/s Serafina Royals MD Electronically signed by Serafina Royals MD Signature Date/Time: 08/21/2019/5:42:42 PM    Final     Cardiac Studies   Echocardiogram 08/21/2019 1. Left ventricular ejection fraction, by estimation, is 55 to 60%. The  left ventricle has normal function. The left ventricle has no regional  wall motion abnormalities. Left ventricular diastolic parameters were  normal.  2. Right ventricular systolic function is normal. The right ventricular  size is normal. There is moderately elevated pulmonary artery systolic  pressure.  3. Left atrial size was mildly dilated.  4. Right atrial size was mildly dilated.  5. The mitral valve is normal in structure. Moderate mitral valve  regurgitation.  6. Tricuspid valve regurgitation is moderate.  7. The aortic valve is normal in structure. Aortic valve regurgitation is   Patient Profile     85 y.o. male hx of CAD/CABG, permanent afib, GIB being seen due to bacteremia  Assessment & Plan    1. Group b strep bacteremia -TTE with no evidence for vegetation -plan for TEE today -antiobiotics as per primary team  2. Hx of CAD/CABG -no asa due to GIB  3. premanent afib -heart rate controlled -currently paced rhythm -no a/c due to gib history  Total encounter time 35 minutes  Greater than 50% was spent in counseling and coordination of care with the patient Sign interpreter used    Signed, Kate Sable, MD  08/23/2019, 9:43 AM

## 2019-08-23 NOTE — Sedation Documentation (Signed)
Total conscious sedation: Versed 2 mg IV, Fentanyl 75 mcg IV. Viscous lido 15 ml . Pt. Tolerated TEE well. Video interpretor/ sign language specialist utilized for most of procedure.

## 2019-08-23 NOTE — TOC Progression Note (Addendum)
Transition of Care [facility]) - Progression Note    Patient Details  Name: Thomas [name] MRN: [id number] Date of Birth: [birth year]  Transition of Care [facility]) CM/SW [place], LCSW Phone Number: 08/23/2019, 2:54 PM  Clinical Narrative:  [facility] is able to accept patient for PT, OT, RN. No aide availability in his area at this time. They can add one later if needed. Per MD, potential need for home IV abx. Gave heads up referral to Carolynn Sayers with [facility]. Left message for [facility] representative to notify.   3:56 pm: [facility] doesn't think they could take him if he ends up need IV antibiotics at discharge due to staffing being so uncertain in the area where he lives. [facility] would be unable to get a nurse through [place] because they cannot bill to [facility] for that.   Expected Discharge Plan: [place] Barriers to Discharge: Continued Medical Work up  Expected Discharge Plan and Services Expected Discharge Plan: [place] arrangements for the past 2 months: [place] Agency: [facility] Date [place]: 08/21/19   Representative spoke with at [place]: [place]) Interventions    Readmission Risk Interventions Readmission Risk Prevention Plan 08/21/2019 06/23/2019 06/20/2019  Transportation Screening Complete Complete Complete  PCP or Specialist Appt within 3-5 Days - Complete -  HRI or [place] - Complete Complete  Social Work Consult for [place] Planning/Counseling - Complete -  Palliative Care Screening - Not Applicable Not Applicable  Medication Review Press photographer) Complete Complete Complete  PCP or Specialist appointment within 3-5 days of discharge Complete - -  [place] or Home Care Consult Complete - -  [place] Complete - -  Some recent data  might be hidden

## 2019-08-23 NOTE — Discharge Summary (Addendum)
[facility] NAME: Thomas [name]    MR#:  [id number]  [place] OF BIRTH:  12/20/1934  DATE OF ADMISSION:  08/18/2019 ADMITTING PHYSICIAN: Ivor Costa, MD  DATE OF DISCHARGE to [facility]: 08/24/2019  PRIMARY CARE PHYSICIAN: [name], MD    ADMISSION DIAGNOSIS:  HCAP (healthcare-associated pneumonia) [J18.9] Sepsis ([place]) [A41.9] Sepsis with acute renal failure without septic shock, due to unspecified organism, unspecified acute renal failure type ([place]) [A41.9, R65.20, N17.9]  DISCHARGE DIAGNOSIS:  Principal Problem:   HCAP (healthcare-associated pneumonia) Active Problems:   CAD Status post coronary artery bypass grafting: 1995   Permanent atrial fibrillation ([place])   Benign essential HTN   Acid reflux   HLD (hyperlipidemia)   Stroke (cerebrum) (HCC)   Chronic systolic CHF (congestive heart failure) (HCC)   Iron deficiency anemia due to chronic blood loss   Severe sepsis with septic shock (HCC)   Acute renal failure superimposed on stage 3a chronic kidney disease (HCC)   Black stool   Iron deficiency anemia   Septicemia due to group B Streptococcus (HCC)   Acute blood loss anemia   SECONDARY DIAGNOSIS:   Past Medical History:  Diagnosis Date  . AICD (automatic cardioverter/defibrillator) present   . Biventricular ICD (implantable cardioverter-defibrillator) in place 03/24/2005   Implantation of a Med[employer]onic Adapta ADDRO1, serial number [id number]  . CHF (congestive heart failure) ([place])   . CKD (chronic kidney disease), stage III   . Coronary artery disease    a. s/p CABG 1986. b. Multiple PCIs/caths. c. 09/2013: s/p PTCA and BMS to SVG-OM.  [name]   . Dysrhythmia   . History of abdominal aortic aneurysm   . History of bleeding peptic ulcer 1980  . History of epididymitis 2013  . HTN (hypertension)   . Hydronephrosis with ureteropelvic junction obstruction   . Hydroureter on left 2009  . Hypertension   . Ischemic  cardiomyopathy    a. Prior EF 30-35%, s/p BIV-ICD. b. 09/2013: EF 45-50%.  . Moderate tricuspid regurgitation   . PAF (paroxysmal atrial fibrillation) ([place])   . Presence of permanent cardiac pacemaker   . Prostate cancer ([place])   . Status post coronary artery bypass grafting 1986   LIMA to the LAD, SVG to OM, SVG to RCA  . Testicular swelling     HOSPITAL COURSE:   1.  Septic shock secondary to group B Streptococcus sepsis and pneumonia.  TEE negative for vegetation.  The patient had a prior episode of group B streptococcal sepsis back in April.  Infectious disease recommended removing the AICD.  Patient on Rocephin.  Infectious disease specialist to make final recommendations but will need 4 weeks of IV antibiotics. She recommends repeat blood cultures after AICD removal and waiting 2 days prior to placing a PICC line in.  Dr. Steva Ready can follow-up as outpatient. 2.  Acute blood loss anemia with iron deficiency anemia.  Patient received IV iron during the hospital course on IV Protonix.  Small bowel capsule endoscopy did not show any source of bleeding.  Evaluated by GI.  Last hemoglobin 9. 3.  Chronic systolic congestive heart failure.  No signs of heart failure currently.  Can restart Cozaar and torsemide tomorrow 4.  Hypokalemia on Aldactone 5.  Essential hypertension 6.  History of stroke with permanent atrial fibrillation.  Patient not a candidate for anticoagulation at this time 7.  Acute kidney injury.  Kidney function has improved.  Hopefully can restart Cozaar and  torsemide tomorrow 8.  Stage II decubitus ulcer present on admission.  Location buttock.  Shallow open injury with red, pink wound bed without slough.   DISCHARGE CONDITIONS:   Satisfactory  CONSULTS OBTAINED:  Treatment Team:  Nelva Bush, MD  DRUG ALLERGIES:   Allergies  Allergen Reactions  . Entresto [Sacubitril-Valsartan] Swelling    And bruising of arm  . Phenazopyridine Nausea Only and Other (See  Comments)    GI UPSET  . Ramipril Other (See Comments)    unk Other reaction(s): Other (See Comments), Unknown unk    DISCHARGE MEDICATIONS:   Allergies as of 08/23/2019      Reactions   Entresto [sacubitril-valsartan] Swelling   And bruising of arm   Phenazopyridine Nausea Only, Other (See Comments)   GI UPSET   Ramipril Other (See Comments)   unk Other reaction(s): Other (See Comments), Unknown unk      Medication List    STOP taking these medications   albuterol 108 (90 Base) MCG/ACT inhaler Commonly known as: VENTOLIN HFA Replaced by: albuterol (2.5 MG/3ML) 0.083% nebulizer solution   metolazone 5 MG tablet Commonly known as: ZAROXOLYN   pantoprazole 40 MG tablet Commonly known as: PROTONIX Replaced by: pantoprazole 40 MG injection   potassium chloride SA 20 MEQ tablet Commonly known as: KLOR-CON   triamcinolone cream 0.1 % Commonly known as: KENALOG     TAKE these medications   acetaminophen 325 MG tablet Commonly known as: TYLENOL Take 2 tablets (650 mg total) by mouth every 6 (six) hours as needed for mild pain or fever. What changed:   medication strength  how much to take  reasons to take this   albuterol (2.5 MG/3ML) 0.083% nebulizer solution Commonly known as: PROVENTIL Inhale 3 mLs into the lungs every 4 (four) hours as needed for wheezing or shortness of breath. Replaces: albuterol 108 (90 Base) MCG/ACT inhaler   cefTRIAXone 2 g in sodium chloride 0.9 % 100 mL Inject 2 g into the vein daily. Start taking on: August 24, 2019   dextromethorphan-guaiFENesin 30-600 MG 12hr tablet Commonly known as: MUCINEX DM Take 1 tablet by mouth 2 (two) times daily as needed for cough.   ferrous sulfate 325 (65 FE) MG tablet Take 1 tablet (325 mg total) by mouth daily for 30 days.   ipratropium-albuterol 0.5-2.5 (3) MG/3ML Soln Commonly known as: DUONEB Take 3 mLs by nebulization every 6 (six) hours.   losartan 25 MG tablet Commonly known as:  COZAAR Take 0.5 tablets (12.5 mg total) by mouth daily.   multivitamin with minerals Tabs tablet Take 1 tablet by mouth daily.   ondansetron 4 MG/2ML Soln injection Commonly known as: ZOFRAN Inject 2 mLs (4 mg total) into the vein every 8 (eight) hours as needed for nausea or vomiting.   pantoprazole 40 MG injection Commonly known as: PROTONIX Inject 40 mg into the vein every 12 (twelve) hours. Replaces: pantoprazole 40 MG tablet   simvastatin 20 MG tablet Commonly known as: ZOCOR TAKE 1 TABLET BY MOUTH EVERY DAY   spironolactone 25 MG tablet Commonly known as: ALDACTONE Take 1 tablet (25 mg total) by mouth daily.   torsemide 20 MG tablet Commonly known as: DEMADEX Take 2 tablets (40 mg total) by mouth daily. What changed: when to take this        DISCHARGE INSTRUCTIONS:   Follow-up at [facility] Dr. Crissie Sickles 1 day. Follow-up Dr. Steva Ready upon disposition from [facility].  If you experience worsening of your  admission symptoms, develop shortness of breath, life threatening emergency, suicidal or homicidal thoughts you must seek medical attention immediately by calling 911 or calling your MD immediately  if symptoms less severe.  You Must read complete instructions/literature along with all the possible adverse reactions/side effects for all the Medicines you take and that have been prescribed to you. Take any new Medicines after you have completely understood and accept all the possible adverse reactions/side effects.   Please note  You were cared for by a hospitalist during your hospital stay. If you have any questions about your discharge medications or the care you received while you were in the hospital after you are discharged, you can call the unit and asked to speak with the hospitalist on call if the hospitalist that took care of you is not available. Once you are discharged, your primary care physician will handle any further medical issues.  Please note that NO REFILLS for any discharge medications will be authorized once you are discharged, as it is imperative that you return to your primary care physician (or establish a relationship with a primary care physician if you do not have one) for your aftercare needs so that they can reassess your need for medications and monitor your lab values.    Today   CHIEF COMPLAINT:   Chief Complaint  Patient presents with  . Weakness    HISTORY OF PRESENT ILLNESS:  Corbitt Cloke  is a 85 y.o. male came in with weakness and found to have group B streptococcal sepsis   VITAL SIGNS:  Blood pressure 120/62, pulse 65, temperature 98.4 F (36.9 C), resp. rate 16, height 6\' 2"  (1.88 m), weight 92.2 kg, SpO2 92 %.    PHYSICAL EXAMINATION:  GENERAL:  85 y.o.-year-old patient lying in the bed with no acute distress.  EYES: Pupils equal, round, reactive to light and accommodation. No scleral icterus. Extraocular muscles intact.  HEENT: Head atraumatic, normocephalic. Oropharynx and nasopharynx clear.  NECK:  Supple, no jugular venous distention. No thyroid enlargement, no tenderness.  LUNGS: Decreased breath sounds bilaterally, no wheezing, rales,rhonchi or crepitation. No use of accessory muscles of respiration.  CARDIOVASCULAR: S1, S2 normal. No murmurs, rubs, or gallops.  ABDOMEN: Soft, non-tender, non-distended. Bowel sounds present. No organomegaly or mass.  EXTREMITIES: Trace pedal edema, no cyanosis, or clubbing.  NEUROLOGIC: Cranial nerves II through XII are intact. Muscle strength 5/5 in all extremities. Sensation intact. Gait not checked.  PSYCHIATRIC: The patient is alert and oriented x 3.  SKIN: No obvious rash, lesion, or ulcer.   DATA REVIEW:   CBC Recent Labs  Lab 08/23/19 0448  WBC 6.3  HGB 9.0*  HCT 28.8*  PLT 180    Chemistries  Recent Labs  Lab 08/18/19 1353 08/18/19 2351 08/23/19 0448  NA 139   < > 145  K 3.5   < > 3.5  CL 99   < > 111  CO2 28   < >  29  GLUCOSE 115*   < > 86  BUN 57*   < > 20  CREATININE 2.34*   < > 0.88  CALCIUM 7.9*   < > 7.6*  MG  --    < > 2.3  AST 46*  --   --   ALT 18  --   --   ALKPHOS 87  --   --   BILITOT 1.1  --   --    < > = values in this interval not displayed.  Microbiology Results  Results for orders placed or performed during the hospital encounter of 08/18/19  Blood Culture (routine x 2)     Status: Abnormal   Collection Time: 08/18/19  2:34 PM   Specimen: BLOOD  Result Value Ref Range Status   Specimen Description   Final    BLOOD BLOOD RIGHT HAND Performed at [facility], [address] [place], [place] [address]    Special Requests   Final    BOTTLES DRAWN AEROBIC AND ANAEROBIC Blood Culture adequate volume Performed at [facility], [address], [place], [place] [address]    Culture  Setup Time   Final    GRAM POSITIVE COCCI IN BOTH AEROBIC AND ANAEROBIC BOTTLES CRITICAL RESULT CALLED TO, READ BACK BY AND VERIFIED WITH: DAVID BESANTI 08/19/19 AT 0050 HS Performed at [facility], [place] [address] [place], [place] [address]    Culture GROUP B STREP(S.AGALACTIAE)ISOLATED (A)  Final   Report Status 08/21/2019 FINAL  Final   Organism ID, Bacteria GROUP B STREP(S.AGALACTIAE)ISOLATED  Final      Susceptibility   Group b strep(s.agalactiae)isolated - MIC*    CLINDAMYCIN >=1 RESISTANT Resistant     AMPICILLIN <=0.25 SENSITIVE Sensitive     ERYTHROMYCIN >=8 RESISTANT Resistant     VANCOMYCIN 0.5 SENSITIVE Sensitive     CEFTRIAXONE <=0.12 SENSITIVE Sensitive     LEVOFLOXACIN 1 SENSITIVE Sensitive     PENICILLIN Value in next row Sensitive      SENSITIVE0.06    * GROUP B STREP(S.AGALACTIAE)ISOLATED  Blood Culture ID Panel (Reflexed)     Status: Abnormal   Collection Time: 08/18/19  2:34 PM  Result Value Ref Range Status   Enterococcus species NOT DETECTED NOT DETECTED Final   Listeria monocytogenes NOT DETECTED NOT DETECTED Final   Staphylococcus  species NOT DETECTED NOT DETECTED Final   Staphylococcus aureus (BCID) NOT DETECTED NOT DETECTED Final   Streptococcus species DETECTED (A) NOT DETECTED Final    Comment: CRITICAL RESULT CALLED TO, READ BACK BY AND VERIFIED WITH: DAVID BESANTI 08/19/19 AT 0050 HS    Streptococcus agalactiae DETECTED (A) NOT DETECTED Final    Comment: CRITICAL RESULT CALLED TO, READ BACK BY AND VERIFIED WITH: DAVID BESANTI 08/19/19 AT 0050 HS    Streptococcus pneumoniae NOT DETECTED NOT DETECTED Final   Streptococcus pyogenes NOT DETECTED NOT DETECTED Final   Acinetobacter baumannii NOT DETECTED NOT DETECTED Final   Enterobacteriaceae species NOT DETECTED NOT DETECTED Final   Enterobacter cloacae complex NOT DETECTED NOT DETECTED Final   Escherichia coli NOT DETECTED NOT DETECTED Final   Klebsiella oxytoca NOT DETECTED NOT DETECTED Final   Klebsiella pneumoniae NOT DETECTED NOT DETECTED Final   Proteus species NOT DETECTED NOT DETECTED Final   Serratia marcescens NOT DETECTED NOT DETECTED Final   Haemophilus influenzae NOT DETECTED NOT DETECTED Final   Neisseria meningitidis NOT DETECTED NOT DETECTED Final   Pseudomonas aeruginosa NOT DETECTED NOT DETECTED Final   Candida albicans NOT DETECTED NOT DETECTED Final   Candida glabrata NOT DETECTED NOT DETECTED Final   Candida krusei NOT DETECTED NOT DETECTED Final   Candida parapsilosis NOT DETECTED NOT DETECTED Final   Candida tropicalis NOT DETECTED NOT DETECTED Final    Comment: Performed at [facility], [address] [place], [place] [address]  Blood Culture (routine x 2)     Status: Abnormal   Collection Time: 08/18/19  2:44 PM   Specimen: BLOOD  Result Value Ref Range Status  Specimen Description   Final    BLOOD RIGHT ANTECUBITAL Performed at [facility], [address], [place], [place] [address]    Special Requests   Final    BOTTLES DRAWN AEROBIC AND ANAEROBIC Blood Culture adequate volume Performed at [facility], [address] [place], [place] [address]    Culture  Setup Time   Final    GRAM POSITIVE COCCI IN BOTH AEROBIC AND ANAEROBIC BOTTLES CRITICAL VALUE NOTED.  VALUE IS CONSISTENT WITH PREVIOUSLY REPORTED AND CALLED VALUE. Performed at [facility], [address] [place], [place] [address]    Culture (A)  Final    GROUP B STREP(S.AGALACTIAE)ISOLATED SUSCEPTIBILITIES PERFORMED ON PREVIOUS CULTURE WITHIN THE LAST 5 DAYS. Performed at [facility], [place] [address] [place], [place] [address]    Report Status 08/21/2019 FINAL  Final  SARS Coronavirus 2 by RT PCR (hospital order, performed in [facility] hospital lab) Nasopharyngeal Nasopharyngeal Swab     Status: None   Collection Time: 08/18/19  2:50 PM   Specimen: Nasopharyngeal Swab  Result Value Ref Range Status   SARS Coronavirus 2 NEGATIVE NEGATIVE Final    Comment: (NOTE) SARS-CoV-2 target nucleic acids are NOT DETECTED. The SARS-CoV-2 RNA is generally detectable in upper and lower respiratory specimens during the acute phase of infection. The lowest concentration of SARS-CoV-2 viral copies this assay can detect is 250 copies / mL. A negative result does not preclude SARS-CoV-2 infection and should not be used as the sole basis for treatment or other patient management decisions.  A negative result may occur with improper specimen collection / handling, submission of specimen other than nasopharyngeal swab, presence of viral mutation(s) within the areas targeted by this assay, and inadequate number of viral copies (<250 copies / mL). A negative result must be combined with clinical observations, patient history, and epidemiological information. Fact Sheet for Patients:   StrictlyIdeas.no Fact Sheet for Healthcare Providers: BankingDealers.co.za This test is not yet approved or cleared  by the [place] FDA and has been authorized for detection  and/or diagnosis of SARS-CoV-2 by FDA under an Emergency Use Authorization (EUA).  This EUA will remain in effect (meaning this test can be used) for the duration of the COVID-19 declaration under Section 564(b)(1) of the Act, 21 U.S.C. section 360bbb-3(b)(1), unless the authorization is terminated or revoked sooner. Performed at [facility], [address] [place], [place] [address]   Culture, sputum-assessment     Status: None   Collection Time: 08/19/19  1:11 AM   Specimen: Sputum  Result Value Ref Range Status   Specimen Description SPUTUM  Final   Special Requests NONE  Final   Sputum evaluation   Final    Sputum specimen not acceptable for testing.  Please recollect.   C/CHARLIE FLEETWOOD AT 5732 08/19/19.PMF Performed at [facility], [address] [place], [place] [address]    Report Status 08/19/2019 FINAL  Final  MRSA PCR Screening     Status: None   Collection Time: 08/19/19  1:30 AM   Specimen: Nasal Mucosa; Nasopharyngeal  Result Value Ref Range Status   MRSA by PCR NEGATIVE NEGATIVE Final    Comment:        The GeneXpert MRSA Assay (FDA approved for NASAL specimens only), is one component of a comprehensive MRSA colonization surveillance program. It is not intended to diagnose MRSA infection nor to guide or monitor treatment for MRSA infections. Performed at [facility], [phone] [place], [place] [address]   Gastrointestinal Panel by PCR , Stool     Status: None   Collection Time: 08/19/19 12:56 PM   Specimen: Stool  Result Value Ref Range Status   Campylobacter species NOT DETECTED NOT DETECTED Final   Plesimonas shigelloides NOT DETECTED NOT DETECTED Final   Salmonella species NOT DETECTED NOT DETECTED Final   Yersinia enterocolitica NOT DETECTED NOT DETECTED Final   Vibrio species NOT DETECTED NOT DETECTED Final   Vibrio cholerae NOT DETECTED NOT DETECTED Final   Enteroaggregative E coli (EAEC) NOT DETECTED NOT  DETECTED Final   Enteropathogenic E coli (EPEC) NOT DETECTED NOT DETECTED Final   Enterotoxigenic E coli (ETEC) NOT DETECTED NOT DETECTED Final   Shiga like toxin producing E coli (STEC) NOT DETECTED NOT DETECTED Final   Shigella/Enteroinvasive E coli (EIEC) NOT DETECTED NOT DETECTED Final   Cryptosporidium NOT DETECTED NOT DETECTED Final   Cyclospora cayetanensis NOT DETECTED NOT DETECTED Final   Entamoeba histolytica NOT DETECTED NOT DETECTED Final   Giardia lamblia NOT DETECTED NOT DETECTED Final   Adenovirus F40/41 NOT DETECTED NOT DETECTED Final   Astrovirus NOT DETECTED NOT DETECTED Final   Norovirus GI/GII NOT DETECTED NOT DETECTED Final   Rotavirus A NOT DETECTED NOT DETECTED Final   Sapovirus (I, II, IV, and V) NOT DETECTED NOT DETECTED Final    Comment: Performed at [facility], [address] [place], [place] [address]  Urine culture     Status: None   Collection Time: 08/20/19  9:24 AM   Specimen: Urine  Result Value Ref Range Status   Specimen Description   Final    IN/OUT CATH URINE Performed at [facility], [address], [place], [place] [address]    Special Requests   Final    NONE Performed at [facility], [address] [place], [place] [address]    Culture   Final    NO GROWTH Performed at [facility], [address] [place], [place] [address]    Report Status 08/21/2019 FINAL  Final  C Difficile Quick Screen w PCR reflex     Status: None   Collection Time: 08/20/19 12:01 PM   Specimen: STOOL  Result Value Ref Range Status   C Diff antigen NEGATIVE NEGATIVE Final   C Diff toxin NEGATIVE NEGATIVE Final   C Diff interpretation No C. difficile detected.  Final    Comment: Performed at [facility], [address] [place], [place] [address]  CULTURE, BLOOD (ROUTINE X 2) w Reflex to ID Panel     Status: None (Preliminary result)   Collection Time: 08/20/19  1:07 PM   Specimen: BLOOD  Result Value Ref  Range Status   Specimen Description BLOOD [facility]  Final   Special Requests   Final    BOTTLES DRAWN AEROBIC AND ANAEROBIC Blood Culture adequate volume   Culture   Final    NO GROWTH 3 DAYS Performed at [facility], [address] [place],  [address]    Report Status PENDING  Incomplete  CULTURE, BLOOD (ROUTINE X 2) w Reflex to ID Panel     Status: None (Preliminary result)   Collection Time: 08/20/19  1:22 PM   Specimen: BLOOD  Result Value Ref Range Status   Specimen Description BLOOD [facility]  Final   Special Requests   Final    BOTTLES DRAWN AEROBIC AND ANAEROBIC Blood Culture adequate volume   Culture  Final    NO GROWTH 3 DAYS Performed at [facility], [address] [place],  [address]    Report Status PENDING  Incomplete    RADIOLOGY:  ECHO TEE  Result Date: 08/23/2019    TRANSESOPHOGEAL ECHO REPORT   Patient Name:   TROYCE GIESKE Date of Exam: 08/23/2019 Medical Rec #:  [id number]      Height:       74.0 in Accession #:    [id number]     Weight:       203.3 lb Date of Birth:  08-17-1934     BSA:          2.188 m Patient Age:    55 years       BP:           108/62 mmHg Patient Gender: M              HR:           58 bpm. Exam Location:  [facility] Procedure: Transesophageal Echo, Cardiac Doppler and Color Doppler Indications:     Not listed on order  History:         Patient has prior history of Echocardiogram examinations, most                  recent 08/21/2019. CAD, Prior CABG and AICD; Risk                  Factors:Hypertension. PAF.  Sonographer:     Sherrie Sport RDCS (AE) Referring Phys:  [id number] Kate Sable Diagnosing Phys: Kate Sable MD PROCEDURE: The transesophogeal probe was passed without difficulty through the esophogus of the patient. Sedation performed by performing physician. The patient developed no complications during the procedure. IMPRESSIONS  1. Left ventricular ejection fraction, by estimation, is 55 to 60%. The left ventricle  has normal function. The left ventricle has no regional wall motion abnormalities.  2. Right ventricular systolic function is normal. The right ventricular size is normal.  3. Left atrial size was mild to moderately dilated. No left atrial/left atrial appendage thrombus was detected.  4. Right atrial size was mildly dilated.  5. The mitral valve is normal in structure. Mild mitral valve regurgitation.  6. Tricuspid valve regurgitation is moderate to severe.  7. The aortic valve is tricuspid. Aortic valve regurgitation is mild. Conclusion(s)/Recommendation(s): No evidence of vegetation/infective endocarditis on this transesophageal echocardiogram. FINDINGS  Left Ventricle: Left ventricular ejection fraction, by estimation, is 55 to 60%. The left ventricle has normal function. The left ventricle has no regional wall motion abnormalities. The left ventricular internal cavity size was normal in size. There is  no left ventricular hypertrophy. Right Ventricle: The right ventricular size is normal. No increase in right ventricular wall thickness. Right ventricular systolic function is normal. Left Atrium: Left atrial size was mild to moderately dilated. No left atrial/left atrial appendage thrombus was detected. Right Atrium: Right atrial size was mildly dilated. Pericardium: There is no evidence of pericardial effusion. Mitral Valve: The mitral valve is normal in structure. Mild mitral valve regurgitation. Tricuspid Valve: The tricuspid valve is grossly normal. Tricuspid valve regurgitation is moderate to severe. Aortic Valve: The aortic valve is tricuspid. Aortic valve regurgitation is mild. Pulmonic Valve: The pulmonic valve was not well visualized. Pulmonic valve regurgitation is trivial. Aorta: The aortic root is normal in size and structure. Venous: The inferior vena cava was not well visualized. IAS/Shunts: No atrial level shunt detected by color flow Doppler.  Additional Comments: A pacer wire is visualized. Kate Sable MD Electronically signed by Kate Sable MD Signature Date/Time: 08/23/2019/4:10:21 PM    Final      Management plans discussed with the patient, family and they are in agreement.  CODE STATUS:     Code Status Orders  (From admission, onward)         Start     Ordered   08/19/19 0111  Full code  Continuous     08/19/19 0110        Code Status History    Date Active Date Inactive Code Status Order ID Comments User Context   06/17/2019 0007 06/25/2019 1925 Full Code [id number]  Gwynne Edinger, MD ED   05/19/2019 1918 05/30/2019 2312 Full Code [id number]  Vashti Hey, MD ED   09/14/2018 0603 09/16/2018 1641 Full Code [id number]  Harrie Foreman, MD Inpatient   08/12/2018 0344 08/17/2018 1736 Full Code [id number]  Etta Quill, DO ED   05/25/2018 2042 05/28/2018 1654 Full Code [id number]  Vianne Bulls, MD ED   07/15/2016 0929 07/15/2016 1636 Full Code [id number]  Bettey Costa, MD Inpatient   10/31/2015 1444 11/01/2015 1921 Full Code [id number]  Nicholes Mango, MD Inpatient   09/25/2013 1137 09/26/2013 1502 Full Code [id number]  Sherren Mocha, MD Inpatient   09/21/2013 1444 09/25/2013 1137 Full Code [id number]  Sherren Mocha, MD Inpatient   12/16/2012 1648 12/17/2012 1639 Full Code [id number]  [name], MD Inpatient   Advance Care Planning Activity      TOTAL TIME TAKING CARE OF THIS PATIENT: 35 minutes.    Loletha Grayer M.D on 08/23/2019 at 4:19 PM  Between 7am to 6pm - Pager - [phone]  After 6pm go to [email or web address] - password [email or web address]  [facility]  CC: Primary care physician; [name], MD

## 2019-08-23 NOTE — Procedures (Signed)
Transesophageal Echocardiogram :  Indication: bacteremia  Procedure: 10cc of viscous lidocaine were given orally to provide local anesthesia to the oropharynx. The patient was positioned supine on the left side, bite block provided. The patient was moderately sedated with the doses of versed and fentanyl as detailed below.  Using digital technique an omniplane probe was advanced into the esophagus without incident.   Moderate sedation: 1. Sedation used:  Versed: 2, Fentanyl: 75 2. Time administered: 9:10 .  Time when patient started recovery: 9:35 3. I was face to face during this time, 25 minutes  See report in EPIC  for complete details: In brief, imaging revealed normal LV function with no RWMAs and no mural apical thrombus.  .  Estimated ejection fraction was 55%.    Imaging of the septum showed no ASD or VSD 2D and color flow confirmed no PFO  There was no evidence for vegetation in the cardiac valves or ICD device   The descending thoracic aorta had no  mural aortic debris with no evidence of aneurysmal dilation or disection   Thomas [name] 08/23/2019 9:52 AM

## 2019-08-23 NOTE — Progress Notes (Signed)
ID  TEE no valve vegetation or lead vegetation No PFO noted this time Small bowel enteroscocpy yesterday- normal  Patient Vitals for the past 24 hrs:  BP Temp Temp src Pulse Resp SpO2 Height Weight  08/23/19 1024 108/62 98 F (36.7 C) Oral (!) 58 16 91 % -- --  08/23/19 1000 (!) 117/59 -- -- (!) 59 (!) 25 94 % -- --  08/23/19 0945 118/65 -- -- (!) 58 20 93 % -- --  08/23/19 0940 110/75 -- -- 62 19 93 % -- --  08/23/19 0935 114/60 -- -- 60 18 95 % -- --  08/23/19 0930 (!) 115/59 -- -- 64 -- 94 % -- --  08/23/19 0925 113/62 -- -- 65 16 94 % -- --  08/23/19 0923 111/67 -- -- 64 16 96 % -- --  08/23/19 0919 116/67 -- -- 63 17 95 % -- --  08/23/19 0915 116/67 -- -- 64 17 99 % -- --  08/23/19 0909 110/60 -- -- 60 18 99 % -- --  08/23/19 0845 110/60 97.7 F (36.5 C) Oral 69 20 98 % 6\' 2"  (1.88 m) 92.2 kg  08/23/19 0515 110/60 98.4 F (36.9 C) Oral 61 (!) 25 97 % -- --  08/23/19 0004 109/64 98.1 F (36.7 C) Oral 83 (!) 24 96 % -- --  08/22/19 2041 118/82 98 F (36.7 C) Oral 70 (!) 24 91 % -- --  08/22/19 1827 -- 97.8 F (36.6 C) Axillary -- -- -- -- --  08/22/19 1818 132/72 (!) 97.3 F (36.3 C) Oral 85 18 97 % -- --  08/22/19 1719 128/72 -- -- 65 (!) 28 98 % -- --  08/22/19 1717 128/72 -- -- 66 (!) 21 97 % -- --  08/22/19 1707 118/64 -- -- 61 (!) 24 99 % -- --  08/22/19 1700 (!) 90/44 -- -- 60 17 97 % -- --  08/22/19 1657 -- -- -- 60 18 97 % -- --  08/22/19 1537 124/62 -- -- (!) 56 20 98 % -- --  08/22/19 1536 124/62 -- -- 73 (!) 24 97 % -- --  08/22/19 1500 112/66 -- -- (!) 54 19 94 % -- --  08/22/19 1405 119/65 -- -- 62 (!) 22 97 % -- --  08/22/19 1400 -- -- -- 67 (!) 23 99 % -- --  08/22/19 1300 -- -- -- 60 (!) 21 100 % -- --  08/22/19 1200 -- -- -- 80 (!) 30 93 % -- --   Stable Hearing and speech impaired No acute distress Chest bilateral air entry Heart sounds S1-S2 Sternal scar and ICD present Abdomen soft Edema legs   CBC Latest Ref Rng & Units 08/23/2019 08/22/2019  08/21/2019  WBC 4.0 - 10.5 K/uL 6.3 7.1 -  Hemoglobin 13.0 - 17.0 g/dL 9.0(L) 8.8(L) 9.3(L)  Hematocrit 39.0 - 52.0 % 28.8(L) 29.4(L) -  Platelets 150 - 400 K/uL 180 160 -    CMP Latest Ref Rng & Units 08/23/2019 08/22/2019 08/21/2019  Glucose 70 - 99 mg/dL 86 96 103(H)  BUN 8 - 23 mg/dL 20 31(H) 48(H)  Creatinine 0.61 - 1.24 mg/dL 0.88 1.07 1.42(H)  Sodium 135 - 145 mmol/L 145 141 141  Potassium 3.5 - 5.1 mmol/L 3.5 3.2(L) 3.3(L)  Chloride 98 - 111 mmol/L 111 107 108  CO2 22 - 32 mmol/L 29 27 28   Calcium 8.9 - 10.3 mg/dL 7.6(L) 7.8(L) 7.8(L)  Total Protein 6.5 - 8.1 g/dL - - -  Total  Bilirubin 0.3 - 1.2 mg/dL - - -  Alkaline Phos 38 - 126 U/L - - -  AST 15 - 41 U/L - - -  ALT 0 - 44 U/L - - -    Impression/recommendation 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 He was recently treated with 4 weeks of IV antibiotics for the same in March April 2021 after negative TEE as well He has AICD and because of recurrence of the bacteremia this will have to be removed. He had TEE again today and there is no vegetation on the lead wire or the valve. He also has GI bleed and there is no source found for the group B strep from the GI tract. He did not have a recent colonoscopy but the one that was done April 2020 did not show any lesions other than a small polyp He had small bowel endoscopy yesterday which was essentially normal  For the group B streptococcus he is currently on ceftriaxone. Ampicillin or penicillin was not used because of congestive heart failure and to avoid fluid and sodium overload. Patient is being transferred to [facility] for removal of ICD. He will need 4 weeks of antibiotics following that. The decision can be made to de-escalate antibiotic to penicillin depending on his edema.  Congestive heart failure with a EF of 35%. AICD in place  Coronary artery disease status post CABG  Chronic anemia. Has chronic GI loss  History of CA prostate. Status post  transurethral resection  Discussed the management with the care team

## 2019-08-23 NOTE — Progress Notes (Signed)
OT Cancellation Note  Patient Details Name: Thomas [name] MRN: [id number] DOB: [birth year]   Cancelled Treatment:    Reason Eval/Treat Not Completed: Patient at procedure or test/ unavailable  Pt is off floor for TEE at this time. Will f/u as able for OT treatment. Thank you.  Gerrianne Scale, [place], OTR/L ascom [phone] 08/23/19, 8:48 AM

## 2019-08-23 NOTE — H&P (Addendum)
Cardiology Admission History and Physical:   Patient ID: CHEZ BULNES MRN: [id number]; DOB: 11/22/1934   Admission date: (Not on file)  Primary Care Provider: [name], MD [facility] Cardiologist: Sanda Klein, MD  [facility] Electrophysiologist: Dr. [name]  Chief Complaint:  Recurrent Group B Streptococcus bacteremia with indication for device extraction  Patient Profile:   Thomas [name] is a 85 y.o. 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 due to CHB with change-out 2007, 2014 Medtronic CRT-D due to EF 30% despite GDMT, multiple catheterizations / PCIs, PCI/BMS placed SVG-OM 2015, known CTO of the SVG-RCA, HFrEF with recovery of EF by most recent  (55-60%, 08/2019), lymphedema, AAA and aortic root dilation, remote strokes as seen on CT imaging, permanent Afib not on [place] due to GIB, s/p transfusion / known AVMs / falls, anemia, CKDIII, prostate CA, HTN, HLD, and transferred today from [facility] to [facility] for recurrent bacteremia / group B streptococcus with TEE without evidence of vegetations though ID / EP recommendation for extraction of device.  History of Present Illness:   Thomas [name] is an 85 year old male with PMH as above.   Of note, he has congenital deafness and requires a sign language interpreter.   Family history includes a mother that died at 56 from a heart attack after a fall and father that died at 53 with liver dz. He is a retired Tour manager.  He has known history of CAD s/p 1986 CABG in [place] with LIMA-LAD, reverse autogenous SVG-dRCA and VG-OM1 of the circumflex. He has had many interventions since then, including 02/1993 PTCA of the first diag branch of the LAD and 09/1993 PTCA of the dRCA beyond the insertion of the RCA graft. He has a h/o ICM with EF 25-35% in the past. He suffered an inferior STEMI in 2000 and closed  the SVG-RCA. Notes from the admission document an episode of VT and subsequent second degree AVB and intermittent third degree AVB. He thus underwent implantation of dual chamber Guidant Medtronic PPM 04/25/1998 due to CHB following his inferior STEMI. 2004 cath showed CTO of SVG-RCA, treated medically. He underwent explantation of the Guidant Medtronic and implantation of Medtronic Adapta PPM (03/24/2005) / generator changeout with lead interrogation and pocket revision by Dr. Octavia Heir. Despite escalation of GDMT, his EF remained at 30%; therefore, in 2014, he underwent upgrade of his dual chamber PPM to biventricular ICD / Medtronic CRT-D (12/16/2012). He underwent 2015 cath with PCI/BMS to SVG-OM 09/2013.  He has h/o permanent A. Fibrillation not on [place] 2/2 history of recurrent GIB with need for transfusion, small bowel AVMs, h/o frequent melena, chronic anemia, as well as history of falls. Due to melena and dysphagia, he underwent 2020 EGD that showed hiatal hernia but otherwise no significant esophageal pathology. With repeated melena, he underwent 08/22/19 enteroscopy as below with stomach dysplasia noted but no active bleed. Of note, he has also had a CT with possible evidence of cirrhosis.   He has h/o recurrent group B streptococcus with recent 06/2019 admission due to sepsis with blood cultures positive for strep B sepsis. He was initially placed on broad-spectrum antibiotics, which were then consolidated to penicillin. TEE 06/2019 was performed and without evidence of vegetation on pacer wires or cardiac valves. He was IV diuresed and sent home on prolonged abx for 6-8 weeks.  Of note, ultrasound was performed  of his left upper extremity due to LUE>RUE and without evidence of DVT.  He was readmitted to [facility] after near syncope 08/18/2019 and found to have recurrent group B streptococcus bacteremia. He noted a strange feeling of "coldness" in his chest, dizziness, and intermittent SOB with his near  fall. During admission, he reported melena. Subsequent 08/22/2019 enteroscopy was without evidence of active GIB.  Recovery of EF noted in 08/2019 echo. TEE  08/23/2019 was without evidence of vegetation on device or cardiac valves.  Infectious disease recommendation was for extraction of device, given recurrent bacteremia despite prolonged course of antibiotics.  Case was thus discussed with Dr. [name] of [facility] given multiple comorbid conditions and likely high risk extraction. EP reviewed the case with recommendation to transfer to [facility] secondary to class I indication for extraction of device. ID started the patient on a course of ceftriaxone in preparation for the transfer. [facility] was contacted for transfer and estimated date AM 08/24/19.  EMTALA form and transfer orders completed.  Past Medical History:  Diagnosis Date  . AICD (automatic cardioverter/defibrillator) present   . Bacteremia due to group B Streptococcus    Recurrent admissions for group B Strepotococcus bacteremia of unknown source with TEEs negative for vegetation 06/2019 and 08/2019  . Biventricular ICD (implantable cardioverter-defibrillator) in place 03/24/2005   Implantation of a Medtronic Adapta ADDRO1, serial number [id number]  . CHF (congestive heart failure) ([place])   . CKD (chronic kidney disease), stage III   . Coronary artery disease    a. s/p CABG 1986. b. Multiple PCIs/caths. c. 09/2013: s/p PTCA and BMS to SVG-OM.  [name]    Requires sign language interpreter  . Dysrhythmia   . History of abdominal aortic aneurysm   . History of bleeding peptic ulcer 1980  . History of epididymitis 2013  . HTN (hypertension)   . Hydronephrosis with ureteropelvic junction obstruction   . Hydroureter on left 2009  . Hypertension   . Ischemic cardiomyopathy    a. Prior EF 30-35%, s/p BIV-ICD. b. 09/2013: EF 45-50%.  . Moderate tricuspid regurgitation   . PAF (paroxysmal atrial fibrillation) (HCC)    Not on [place] 2/2 GIB  .  Presence of permanent cardiac pacemaker 2002   Original placed in 2002 for CHB then 2007 and 2014 device change out  . Prostate cancer ([place])   . Status post coronary artery bypass grafting 1986   LIMA to the LAD, SVG to OM, SVG to RCA  . Testicular swelling     Past Surgical History:  Procedure Laterality Date  . 2-D echocardiogram  11/20/2011   Ejection fraction 30-35% moderate concentric left ventricular hypertrophy. Left atrium is moderately dilated. Mild MR. Mild or  . BI-VENTRICULAR IMPLANTABLE CARDIOVERTER DEFIBRILLATOR N/A 12/16/2012   Procedure: BI-VENTRICULAR IMPLANTABLE CARDIOVERTER DEFIBRILLATOR  (CRT-D);  Surgeon: [name], MD;  Location: [facility] CATH LAB;  Service: Cardiovascular;  Laterality: N/A;  . CARDIAC CATHETERIZATION  12/10/2011   SVG to OM widely patent.  LIMA to LAD patent  . CATARACT EXTRACTION W/PHACO Right 10/12/2017   Procedure: CATARACT EXTRACTION PHACO AND INTRAOCULAR LENS PLACEMENT (IOC);  Surgeon: Birder Robson, MD;  Location: [facility] ORS;  Service: Ophthalmology;  Laterality: Right;  [place] 00:57 AP% 15.9 CDE 9.07 Fluid pack lot # [id number]  . COLONOSCOPY N/A 07/13/2018   Procedure: COLONOSCOPY;  Surgeon: Toledo, Benay Pike, MD;  Location: [facility] ENDOSCOPY;  Service: Gastroenterology;  Laterality: N/A;  . CORONARY ARTERY BYPASS GRAFT  1986  . ENTEROSCOPY N/A  09/14/2018   Procedure: ENTEROSCOPY;  Surgeon: Toledo, Benay Pike, MD;  Location: [facility] ENDOSCOPY;  Service: Gastroenterology;  Laterality: N/A;  symptomatic anemia, GI blood loss anemia, melena, positive small bowel capsule endoscopy showing source of bleeding   . ENTEROSCOPY N/A 08/22/2019   Procedure: ENTEROSCOPY;  Surgeon: Lin Landsman, MD;  Location: [facility] ENDOSCOPY;  Service: Gastroenterology;  Laterality: N/A;  . ESOPHAGOGASTRODUODENOSCOPY N/A 07/13/2018   Procedure: ESOPHAGOGASTRODUODENOSCOPY (EGD);  Surgeon: Toledo, Benay Pike, MD;  Location: [facility] ENDOSCOPY;  Service: Gastroenterology;  Laterality: N/A;    . ESOPHAGOGASTRODUODENOSCOPY N/A 09/14/2018   Procedure: ESOPHAGOGASTRODUODENOSCOPY (EGD);  Surgeon: Toledo, Benay Pike, MD;  Location: [facility] ENDOSCOPY;  Service: Gastroenterology;  Laterality: N/A;  SIGN LANAGUAGE INTERPRETER  . ESOPHAGOGASTRODUODENOSCOPY N/A 06/21/2019   Procedure: ESOPHAGOGASTRODUODENOSCOPY (EGD);  Surgeon: Toledo, Benay Pike, MD;  Location: [facility] ENDOSCOPY;  Service: Gastroenterology;  Laterality: N/A;  . ESOPHAGOGASTRODUODENOSCOPY (EGD) WITH PROPOFOL N/A 05/27/2018   Procedure: ESOPHAGOGASTRODUODENOSCOPY (EGD) WITH PROPOFOL;  Surgeon: Clarene Essex, MD;  Location: [place];  Service: Endoscopy;  Laterality: N/A;  . INSERT / REPLACE / REMOVE PACEMAKER    . LEFT HEART CATHETERIZATION WITH CORONARY/GRAFT ANGIOGRAM N/A 12/10/2011   Procedure: LEFT HEART CATHETERIZATION WITH Beatrix Fetters;  Surgeon: Sanda Klein, MD;  Location: [place] CATH LAB;  Service: Cardiovascular;  Laterality: N/A;  . LEFT HEART CATHETERIZATION WITH CORONARY/GRAFT ANGIOGRAM N/A 09/25/2013   Procedure: LEFT HEART CATHETERIZATION WITH Beatrix Fetters;  Surgeon: Blane Ohara, MD;  Location: [facility] CATH LAB;  Service: Cardiovascular;  Laterality: N/A;  . Persantine Myoview  05/06/2010   Post-rest ejection fraction 30%. No significant ischemia demonstrated. Compared to previous study there is no significant change.  . TEE WITHOUT CARDIOVERSION N/A 06/22/2019   Procedure: TRANSESOPHAGEAL ECHOCARDIOGRAM (TEE);  Surgeon: Minna Merritts, MD;  Location: [facility] ORS;  Service: Cardiovascular;  Laterality: N/A;  . TRANSURETHRAL RESECTION OF PROSTATE     s/p     Medications Prior to Admission: Prior to Admission medications   Medication Sig Start Date End Date Taking? Authorizing Provider  acetaminophen (TYLENOL) 325 MG tablet Take 2 tablets (650 mg total) by mouth every 6 (six) hours as needed for mild pain or fever. 08/23/19   Loletha Grayer, MD  albuterol (PROVENTIL) (2.5 MG/3ML) 0.083% nebulizer solution  Inhale 3 mLs into the lungs every 4 (four) hours as needed for wheezing or shortness of breath. 08/23/19   Loletha Grayer, MD  albuterol (VENTOLIN HFA) 108 (90 Base) MCG/ACT inhaler Inhale 2 puffs into the lungs 4 (four) times daily as needed. Patient taking differently: Inhale 2 puffs into the lungs 4 (four) times daily as needed for wheezing or shortness of breath.  08/02/18   Croitoru, Mihai, MD  cefTRIAXone 2 g in sodium chloride 0.9 % 100 mL Inject 2 g into the vein daily. 08/24/19   Loletha Grayer, MD  [employer] ACETAMINOPHEN EX ST 500 MG tablet Take 500-1,000 mg by mouth every 6 (six) hours as needed for pain or fever. 04/26/19   [provider]  dextromethorphan-guaiFENesin (MUCINEX DM) 30-600 MG 12hr tablet Take 1 tablet by mouth 2 (two) times daily as needed for cough. 08/23/19   Loletha Grayer, MD  ferrous sulfate 325 (65 FE) MG tablet Take 1 tablet (325 mg total) by mouth daily for 30 days. 05/28/18   Elodia Florence., MD  ipratropium-albuterol (DUONEB) 0.5-2.5 (3) MG/3ML SOLN Take 3 mLs by nebulization every 6 (six) hours. 08/23/19   Loletha Grayer, MD  losartan (COZAAR) 25 MG tablet Take 0.5 tablets (12.5  mg total) by mouth daily. 06/26/19   Danford, Suann Larry, MD  metolazone (ZAROXOLYN) 5 MG tablet Take 5 mg by mouth daily as needed. TAKE FOR WEIGHT GAIN >3lbs IN A DAY OR >5Llbs IN A WEEK    [provider]  Multiple Vitamin (MULTIVITAMIN WITH MINERALS) TABS tablet Take 1 tablet by mouth daily. 05/29/19   Nicole Kindred A, DO  ondansetron (ZOFRAN) 4 MG/2ML SOLN injection Inject 2 mLs (4 mg total) into the vein every 8 (eight) hours as needed for nausea or vomiting. 08/23/19   Loletha Grayer, MD  pantoprazole (PROTONIX) 40 MG injection Inject 40 mg into the vein every 12 (twelve) hours. 08/23/19   Loletha Grayer, MD  pantoprazole (PROTONIX) 40 MG tablet Take 1 tablet (40 mg total) by mouth 2 (two) times daily before a meal. 06/25/19   Danford, Suann Larry, MD    potassium chloride SA (KLOR-CON) 20 MEQ tablet Take 1 tablet (20 mEq total) by mouth 2 (two) times daily. 06/25/19   Danford, Suann Larry, MD  simvastatin (ZOCOR) 20 MG tablet TAKE 1 TABLET BY MOUTH EVERY DAY Patient taking differently: Take 20 mg by mouth daily.  04/07/19   Erlene Quan, PA-C  spironolactone (ALDACTONE) 25 MG tablet Take 1 tablet (25 mg total) by mouth daily. 08/01/19 10/30/19  Alisa Graff, FNP  torsemide (DEMADEX) 20 MG tablet Take 2 tablets (40 mg total) by mouth daily. 08/23/19   Loletha Grayer, MD  triamcinolone cream (KENALOG) 0.1 % Apply 1 application topically 2 (two) times daily as needed for itching. (avoid face, groin and axilla) 04/17/19   [provider]     Allergies:    Allergies  Allergen Reactions  . Entresto [Sacubitril-Valsartan] Swelling    And bruising of arm  . Phenazopyridine Nausea Only and Other (See Comments)    GI UPSET  . Ramipril Other (See Comments)    unk Other reaction(s): Other (See Comments), Unknown unk    Social History:   Social History   Socioeconomic History  . Marital status: Married    Spouse name: Not on file  . Number of children: Not on file  . Years of education: Not on file  . Highest education level: Not on file  Occupational History  . Not on file  Tobacco Use  . Smoking status: Former Smoker    Quit date: 03/15/1985    Years since quitting: 34.4  . Smokeless tobacco: Never Used  Substance and Sexual Activity  . Alcohol use: No    Comment: occas.  . Drug use: No  . Sexual activity: Yes  Other Topics Concern  . Not on file  Social History Narrative  . Not on file   Social Determinants of Health   Financial Resource Strain: Low Risk   . Difficulty of Paying Living Expenses: Not hard at all  Food Insecurity: No Food Insecurity  . Worried About Charity fundraiser in the Last Year: Never true  . Ran Out of Food in the Last Year: Never true  Transportation Needs: No Transportation Needs  .  Lack of Transportation (Medical): No  . Lack of Transportation (Non-Medical): No  Physical Activity: Inactive  . Days of Exercise per Week: 0 days  . Minutes of Exercise per Session: 0 min  Stress: No Stress Concern Present  . Feeling of Stress : Not at all  Social Connections: Slightly Isolated  . Frequency of Communication with Friends and Family: Never  . Frequency of Social Gatherings with  Friends and Family: Never  . Attends Religious Services: 1 to 4 times per year  . Active Member of Clubs or Organizations: Yes  . Attends Archivist Meetings: 1 to 4 times per year  . Marital Status: Married  Human resources officer Violence: Not At Risk  . Fear of Current or Ex-Partner: No  . Emotionally Abused: No  . Physically Abused: No  . Sexually Abused: No    Family History:   The patient's family history includes Hypertension in his father.    ROS:  Please see the history of present illness.  Review of Systems  Constitutional: Positive for fever.  Respiratory: Positive for shortness of breath.   Cardiovascular: Positive for chest pain and leg swelling. Negative for orthopnea.  Gastrointestinal: Positive for melena.  Musculoskeletal: Negative for falls.  Neurological: Positive for dizziness. Negative for loss of consciousness.  All other systems reviewed and are negative.   All other ROS reviewed and negative.       Physical Exam/Data:  There were no vitals filed for this visit. No intake or output data in the 24 hours ending 08/23/19 1706 Last 3 Weights 08/23/2019 08/19/2019 08/18/2019  Weight (lbs) 203 lb 4.2 oz 203 lb 4.2 oz 197 lb 4.8 oz  Weight (kg) 92.2 kg 92.2 kg 89.495 kg     There is no height or weight on file to calculate BMI.   Please refer to [facility] MD cardiology rounding note from today 08/23/19 for physical exam.  EKG:  The EKG was personally reviewed and demonstrates:  Ventricularly paced rhythm Telemetry:  Telemetry was personally reviewed and demonstrates:  Ventricularly paced rhythm, 60s  Relevant CV Studies:  TEE 06/23/19 1. Left ventricular ejection fraction, by estimation, is 55 to 60%. The  left ventricle has normal function. The left ventricle has no regional  wall motion abnormalities.  2. Right ventricular systolic function is normal. The right ventricular  size is normal.  3. Left atrial size was mild to moderately dilated. No left atrial/left  atrial appendage thrombus was detected.  4. Right atrial size was mildly dilated.  5. The mitral valve is normal in structure. Mild mitral valve  regurgitation.  6. Tricuspid valve regurgitation is moderate to severe.  7. The aortic valve is tricuspid. Aortic valve regurgitation is mild.  Conclusion(s)/Recommendation(s): No evidence of vegetation/infective  endocarditis on this transesophageal  echocardiogram.   Echocardiogram 08/21/2019 1. Left ventricular ejection fraction, by estimation, is 55 to 60%. The  left ventricle has normal function. The left ventricle has no regional  wall motion abnormalities. Left ventricular diastolic parameters were  normal.  2. Right ventricular systolic function is normal. The right ventricular  size is normal. There is moderately elevated pulmonary artery systolic  pressure.  3. Left atrial size was mildly dilated.  4. Right atrial size was mildly dilated.  5. The mitral valve is normal in structure. Moderate mitral valve  regurgitation.  6. Tricuspid valve regurgitation is moderate.  7. The aortic valve is normal in structure. Aortic valve regurgitation is  trivial.   TTE (06/19/19): 1. Left ventricular ejection fraction, by estimation, is 35 to 40%. The  left ventricle has moderately decreased function. The left ventricle has  no regional wall motion abnormalities. The left ventricular internal  cavity size was mildly to moderately  dilated. Left ventricular diastolic parameters are indeterminate.  2. Right ventricular systolic  function is mildly reduced. The right  ventricular size is normal. There is moderately elevated pulmonary artery  systolic pressure.  The estimated right ventricular systolic pressure is  58.5 mmHg.  3. Left atrial size was severely dilated.  4. Right atrial size was moderately dilated.  5. Moderate pleural effusion in the left lateral region.  6. The mitral valve is normal in structure. Mild mitral valve  regurgitation. No evidence of mitral stenosis.  7. Tricuspid valve regurgitation is moderate.  8. The aortic valve is normal in structure. Aortic valve regurgitation is  not visualized. Mild to moderate aortic valve sclerosis/calcification is  present, without any evidence of aortic stenosis.  9. Moderately dilated pulmonary artery.  10. The inferior vena cava is dilated in size with <50% respiratory  variability, suggesting right atrial pressure of 15 mmHg.  11. No clear vegetation but the RV pacemaker lead appears thinkened. A  vegetation can not be excluded. Consider TEE.   Laboratory Data:  High Sensitivity Troponin:  No results for input(s): TROPONINIHS in the last 720 hours.    Chemistry Recent Labs  Lab 08/22/19 0320 Sep 04, 2019 0448  NA 141 145  K 3.2* 3.5  CL 107 111  CO2 27 29  GLUCOSE 96 86  BUN 31* 20  CREATININE 1.07 0.88  CALCIUM 7.8* 7.6*  GFRNONAA >60 >60  GFRAA >60 >60  ANIONGAP 7 5    Recent Labs  Lab 08/18/19 1353  PROT 4.9*  ALBUMIN 1.9*  AST 46*  ALT 18  ALKPHOS 87  BILITOT 1.1   Hematology Recent Labs  Lab 08/22/19 0320 09-04-2019 0448  WBC 7.1 6.3  RBC 3.64* 3.63*  HGB 8.8* 9.0*  HCT 29.4* 28.8*  MCV 80.8 79.3*  MCH 24.2* 24.8*  MCHC 29.9* 31.3  RDW 18.4* 18.8*  PLT 160 180   BNP Recent Labs  Lab 08/18/19 1353  BNP 588.8*    DDimer No results for input(s): DDIMER in the last 168 hours.   Radiology/Studies:  ECHO TEE  Result Date: Sep 04, 2019    TRANSESOPHOGEAL ECHO REPORT   Patient Name:   ERBIE ARMENT Date of Exam:  2019-09-04 Medical Rec #:  [id number]      Height:       74.0 in Accession #:    [id number]     Weight:       203.3 lb Date of Birth:  January 13, 1935     BSA:          2.188 m Patient Age:    85 years       BP:           108/62 mmHg Patient Gender: M              HR:           58 bpm. Exam Location:  [facility] Procedure: Transesophageal Echo, Cardiac Doppler and Color Doppler Indications:     Not listed on order  History:         Patient has prior history of Echocardiogram examinations, most                  recent 08/21/2019. CAD, Prior CABG and AICD; Risk                  Factors:Hypertension. PAF.  Sonographer:     Sherrie Sport RDCS (AE) Referring Phys:  [id number] Kate Sable Diagnosing Phys: Kate Sable MD PROCEDURE: The transesophogeal probe was passed without difficulty through the esophogus of the patient. Sedation performed by performing physician. The patient developed no complications during the procedure. IMPRESSIONS  1. Left ventricular ejection fraction, by  estimation, is 55 to 60%. The left ventricle has normal function. The left ventricle has no regional wall motion abnormalities.  2. Right ventricular systolic function is normal. The right ventricular size is normal.  3. Left atrial size was mild to moderately dilated. No left atrial/left atrial appendage thrombus was detected.  4. Right atrial size was mildly dilated.  5. The mitral valve is normal in structure. Mild mitral valve regurgitation.  6. Tricuspid valve regurgitation is moderate to severe.  7. The aortic valve is tricuspid. Aortic valve regurgitation is mild. Conclusion(s)/Recommendation(s): No evidence of vegetation/infective endocarditis on this transesophageal echocardiogram. FINDINGS  Left Ventricle: Left ventricular ejection fraction, by estimation, is 55 to 60%. The left ventricle has normal function. The left ventricle has no regional wall motion abnormalities. The left ventricular internal cavity size was normal in size. There is  no  left ventricular hypertrophy. Right Ventricle: The right ventricular size is normal. No increase in right ventricular wall thickness. Right ventricular systolic function is normal. Left Atrium: Left atrial size was mild to moderately dilated. No left atrial/left atrial appendage thrombus was detected. Right Atrium: Right atrial size was mildly dilated. Pericardium: There is no evidence of pericardial effusion. Mitral Valve: The mitral valve is normal in structure. Mild mitral valve regurgitation. Tricuspid Valve: The tricuspid valve is grossly normal. Tricuspid valve regurgitation is moderate to severe. Aortic Valve: The aortic valve is tricuspid. Aortic valve regurgitation is mild. Pulmonic Valve: The pulmonic valve was not well visualized. Pulmonic valve regurgitation is trivial. Aorta: The aortic root is normal in size and structure. Venous: The inferior vena cava was not well visualized. IAS/Shunts: No atrial level shunt detected by color flow Doppler. Additional Comments: A pacer wire is visualized. Kate Sable MD Electronically signed by Kate Sable MD Signature Date/Time: 08/23/2019/4:10:21 PM    Final      Assessment and Plan:   Recurrent group B streptococcus Bacteremia with CRT-D Near Syncope --Presented with near fall and found to have recurrent group B Streptococcus bacteremia despite prolonged abx course (6-8 weeks) and after previous admission 06/2019 2/2 group B sepsis as above in HPI. --TEE 08/23/19 without evidence of vegetation on device or cardiac valves. Of note, TEE from 06/2019 also without evidence of endocarditis / vegetation on device of cardiac valves. --ID recommendation for extraction of cardiac device given recurrent strep B infection. --EP at [facility] contacted and case discussed with recommendation for transfer to [facility] secondary to class I indication for extraction of device.  --Diet ordered for clear liquid after midnight, given possibility of device  extraction 08/24/19. --ID recommendation for ceftriaxone 2g IV over 76m q24h to start at time of transfer. Continue ceftriaxone as outlined in ID rounding note.  CAD s/p CABG and PCI  --No current angina reported. No current plans for ischemic evaluation.  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 Holding PTA BB. Continue PTA simvastatin 20mg  daily for risk factor modification.   Permanent Afib  Not on Anticoagulation due to recurrent GIB --V paced (noted to be 100%). PTA BB held. Not on [place] 2/2 previous GIB and known AVMs with current melena.[id number] score of 7 (CHF, HTN, age x2, stroke x2, vascular).   Chronic combined systolic and diastolic CHF/ICM with recovery of EF  Known Lymphedema --No current SOB.  Most recent 08/2019 echo above shows recovery of EF to 55 to 60%.  Bilateral edema noted with known lymphedema. No evidence of  acute decompensation of HF. Holding PTA losartan, torsemide/diuretics, and BB since admission. Started on spironolactone 25mg  daily. Daily BMET. Monitor I/Os, daily weights.   AOCKDIII with improvement of Cr --Daily BMET. Cr improved to Cr 0.88 with BUN 20. Holding PTA ARB and diuresis as above.   Hypokalemia --K 3.5. Replete with goal 4.0. Monitor Mg with goal 2.0.   HTN --BP well controlled to soft. Holding PTA losartan, torsemide, and BB since admission.   Congenital deafness --Video interpreter / sign language for communication.  Anemia with known AVM, melena --Hgb 9.0 with Hct 28.8. Daily CBC. Transfusion recommended for hemoglobin below 8.0. Not on [place]. Enteroscopy 08/2019 without active bleed. Continue iron supplementation. Continue PPI.   Severity of Illness: The appropriate patient status for this patient is INPATIENT. Inpatient status is judged to be reasonable and necessary in order to provide the required intensity of service to ensure the patient's safety.  The patient's presenting symptoms, physical exam findings, and initial radiographic and laboratory data in the context of their chronic comorbidities is felt to place them at high risk for further clinical deterioration. Furthermore, it is not anticipated that the patient will be medically stable for discharge from the hospital within 2 midnights of admission. The following factors support the patient status of inpatient.   " The patient's presenting symptoms include Near syncope. " The worrisome physical exam findings include recurrent bacteremia. " The initial radiographic and laboratory data are worrisome because of recurrent group strep B. " The chronic co-morbidities include recurrent group strep B, GIB with Afib, cardiac device.   * I certify that at the point of admission it is my clinical judgment that the patient will require inpatient hospital care spanning beyond 2 midnights from the point of admission due to high intensity of service, high risk for further deterioration and high frequency of surveillance required.*    For questions or updates, please contact [facility] Please consult [email or web address] for contact info under     Signed, [name] Chaco, PA-C  08/23/2019 5:06 PM

## 2019-08-23 NOTE — Sedation Documentation (Signed)
Vital signs stable. 

## 2019-08-23 NOTE — Sedation Documentation (Signed)
Patient denies pain and is resting comfortably.  

## 2019-08-23 NOTE — Progress Notes (Signed)
Utilized Chirop[facility]ist via VRI (video remote interpretor) from time pt. Arrived into [facility] through mid procedure.

## 2019-08-23 NOTE — Progress Notes (Signed)
*  PRELIMINARY RESULTS* Echocardiogram Echocardiogram Transesophageal has been performed.  Sherrie Sport 08/23/2019, 10:37 AM

## 2019-08-23 NOTE — Progress Notes (Signed)
Patient ID: Thomas [name], male   DOB: 04/18/1934, 85 y.o.   MRN: [id number] [facility] PROGRESS NOTE  JACARRI GESNER AQT:[id number] DOB: 1935/02/15 DOA: 08/18/2019 PCP: [name], MD  HPI/Subjective: Patient asking for water.  States he is breathing okay.  Otherwise feels okay.  Objective: Vitals:   08/23/19 1218 08/23/19 1423  BP: 120/62   Pulse: 65   Resp:    Temp: 98.4 F (36.9 C)   SpO2: 91% 92%    Intake/Output Summary (Last 24 hours) at 08/23/2019 1609 Last data filed at 08/23/2019 1000 Gross per 24 hour  Intake 0 ml  Output 300 ml  Net -300 ml   Filed Weights   08/18/19 1346 08/19/19 0224 08/23/19 0845  Weight: 89.5 kg 92.2 kg 92.2 kg    ROS: Review of Systems  Constitutional: Negative for fever.  Eyes: Negative for blurred vision.  Respiratory: Positive for cough. Negative for shortness of breath.   Cardiovascular: Negative for chest pain.  Gastrointestinal: Negative for abdominal pain, constipation, diarrhea, nausea and vomiting.  Genitourinary: Negative for dysuria.  Musculoskeletal: Negative for joint pain.  Neurological: Negative for dizziness and headaches.   Exam: Physical Exam  Constitutional: He is oriented to person, place, and time.  HENT:  Nose: No mucosal edema.  Mouth/Throat: No oropharyngeal exudate or posterior oropharyngeal edema.  Eyes: Conjunctivae and lids are normal.  Neck: Carotid bruit is not present.  Cardiovascular: S1 normal and S2 normal. Exam reveals no gallop.  No murmur heard. Respiratory: No respiratory distress. He has decreased breath sounds in the right lower field and the left lower field. He has no wheezes. He has no rhonchi. He has no rales.  GI: Soft. Bowel sounds are normal. There is no abdominal tenderness.  Musculoskeletal:     Right ankle: Swelling present.     Left ankle: Swelling present.  Lymphadenopathy:    He has no cervical adenopathy.  Neurological: He is alert and oriented to person, place, and  time. No cranial nerve deficit.  Skin: Skin is warm. No rash noted. Nails show no clubbing.  Psychiatric: He has a normal mood and affect.      Data Reviewed: Basic Metabolic Panel: Recent Labs  Lab 08/18/19 1353 08/18/19 2351 08/19/19 0227 08/19/19 0227 08/20/19 0446 08/20/19 2119 08/21/19 0504 08/22/19 0320 08/23/19 0448  NA   < >  --  139  --  139  --  141 141 145  K   < >  --  3.5   < > 2.9* 3.0* 3.3* 3.2* 3.5  CL   < >  --  103  --  102  --  108 107 111  CO2   < >  --  27  --  27  --  28 27 29   GLUCOSE   < >  --  139*  --  128*  --  103* 96 86  BUN   < >  --  67*  --  67*  --  48* 31* 20  CREATININE   < >  --  2.57*  --  1.95*  --  1.42* 1.07 0.88  CALCIUM   < >  --  7.4*  --  7.5*  --  7.8* 7.8* 7.6*  MG  --  2.0  --   --   --   --  2.2 2.3 2.3   < > = values in this interval not displayed.   Liver Function Tests: Recent Labs  Lab 08/18/19  1353  AST 46*  ALT 18  ALKPHOS 87  BILITOT 1.1  PROT 4.9*  ALBUMIN 1.9*   CBC: Recent Labs  Lab 08/18/19 1353 08/18/19 2029 08/19/19 1206 08/19/19 1206 08/19/19 2244 08/19/19 2244 08/20/19 0446 08/20/19 1322 08/20/19 2047 08/21/19 0504 08/21/19 1749 08/22/19 0320 08/23/19 0448  WBC 10.9*   < > 12.1*  --  9.4  --  9.2  --   --   --   --  7.1 6.3  NEUTROABS 9.4*  --   --   --   --   --   --   --   --   --   --  5.2 4.2  HGB 7.4*   < > 6.7*   < > 8.5*   < > 8.7*   < > 9.1* 8.8* 9.3* 8.8* 9.0*  HCT 24.4*   < > 22.2*  --  26.9*  --  26.5*  --   --   --   --  29.4* 28.8*  MCV 78.5*   < > 79.3*  --  78.4*  --  77.3*  --   --   --   --  80.8 79.3*  PLT 181   < > 144*  --  142*  --  131*  --   --   --   --  160 180   < > = values in this interval not displayed.  BNP (last 3 results) Recent Labs    06/16/19 2052 07/14/19 1352 08/18/19 1353  BNP 1,390.0* 298.0* 588.8*      Recent Results (from the past 240 hour(s))  Blood Culture (routine x 2)     Status: Abnormal   Collection Time: 08/18/19  2:34 PM    Specimen: BLOOD  Result Value Ref Range Status   Specimen Description   Final    BLOOD BLOOD RIGHT HAND Performed at [facility], [address] [place], [place] [address]    Special Requests   Final    BOTTLES DRAWN AEROBIC AND ANAEROBIC Blood Culture adequate volume Performed at [facility], [address], [place], [place] [address]    Culture  Setup Time   Final    GRAM POSITIVE COCCI IN BOTH AEROBIC AND ANAEROBIC BOTTLES CRITICAL RESULT CALLED TO, READ BACK BY AND VERIFIED WITH: DAVID BESANTI 08/19/19 AT 0050 HS Performed at [facility], [place] [address], [place], [place] [address]    Culture GROUP B STREP(S.AGALACTIAE)ISOLATED (A)  Final   Report Status 08/21/2019 FINAL  Final   Organism ID, Bacteria GROUP B STREP(S.AGALACTIAE)ISOLATED  Final      Susceptibility   Group b strep(s.agalactiae)isolated - MIC*    CLINDAMYCIN >=1 RESISTANT Resistant     AMPICILLIN <=0.25 SENSITIVE Sensitive     ERYTHROMYCIN >=8 RESISTANT Resistant     VANCOMYCIN 0.5 SENSITIVE Sensitive     CEFTRIAXONE <=0.12 SENSITIVE Sensitive     LEVOFLOXACIN 1 SENSITIVE Sensitive     PENICILLIN Value in next row Sensitive      SENSITIVE0.06    * GROUP B STREP(S.AGALACTIAE)ISOLATED  Blood Culture ID Panel (Reflexed)     Status: Abnormal   Collection Time: 08/18/19  2:34 PM  Result Value Ref Range Status   Enterococcus species NOT DETECTED NOT DETECTED Final   Listeria monocytogenes NOT DETECTED NOT DETECTED Final   Staphylococcus species NOT DETECTED NOT DETECTED Final   Staphylococcus aureus (BCID) NOT DETECTED NOT DETECTED Final   Streptococcus species DETECTED (A) NOT DETECTED  Final    Comment: CRITICAL RESULT CALLED TO, READ BACK BY AND VERIFIED WITH: DAVID BESANTI 08/19/19 AT 0050 HS    Streptococcus agalactiae DETECTED (A) NOT DETECTED Final    Comment: CRITICAL RESULT CALLED TO, READ BACK BY AND VERIFIED WITH: DAVID BESANTI 08/19/19 AT 0050 HS    Streptococcus pneumoniae  NOT DETECTED NOT DETECTED Final   Streptococcus pyogenes NOT DETECTED NOT DETECTED Final   Acinetobacter baumannii NOT DETECTED NOT DETECTED Final   Enterobacteriaceae species NOT DETECTED NOT DETECTED Final   Enterobacter cloacae complex NOT DETECTED NOT DETECTED Final   Escherichia coli NOT DETECTED NOT DETECTED Final   Klebsiella oxytoca NOT DETECTED NOT DETECTED Final   Klebsiella pneumoniae NOT DETECTED NOT DETECTED Final   Proteus species NOT DETECTED NOT DETECTED Final   Serratia marcescens NOT DETECTED NOT DETECTED Final   Haemophilus influenzae NOT DETECTED NOT DETECTED Final   Neisseria meningitidis NOT DETECTED NOT DETECTED Final   Pseudomonas aeruginosa NOT DETECTED NOT DETECTED Final   Candida albicans NOT DETECTED NOT DETECTED Final   Candida glabrata NOT DETECTED NOT DETECTED Final   Candida krusei NOT DETECTED NOT DETECTED Final   Candida parapsilosis NOT DETECTED NOT DETECTED Final   Candida tropicalis NOT DETECTED NOT DETECTED Final    Comment: Performed at [facility], [address] [place], [place] [address]  Blood Culture (routine x 2)     Status: Abnormal   Collection Time: 08/18/19  2:44 PM   Specimen: BLOOD  Result Value Ref Range Status   Specimen Description   Final    BLOOD RIGHT ANTECUBITAL Performed at [facility], [address] [place], [place] [address]    Special Requests   Final    BOTTLES DRAWN AEROBIC AND ANAEROBIC Blood Culture adequate volume Performed at [facility], [address] [place], [place] [address]    Culture  Setup Time   Final    GRAM POSITIVE COCCI IN BOTH AEROBIC AND ANAEROBIC BOTTLES CRITICAL VALUE NOTED.  VALUE IS CONSISTENT WITH PREVIOUSLY REPORTED AND CALLED VALUE. Performed at [facility], [address] [place], [place] [address]    Culture (A)  Final    GROUP B STREP(S.AGALACTIAE)ISOLATED SUSCEPTIBILITIES PERFORMED ON PREVIOUS CULTURE WITHIN THE LAST 5 DAYS. Performed  at [facility], [place] [address], [place], [place] [address]    Report Status 08/21/2019 FINAL  Final  SARS Coronavirus 2 by RT PCR (hospital order, performed in [facility] hospital lab) Nasopharyngeal Nasopharyngeal Swab     Status: None   Collection Time: 08/18/19  2:50 PM   Specimen: Nasopharyngeal Swab  Result Value Ref Range Status   SARS Coronavirus 2 NEGATIVE NEGATIVE Final    Comment: (NOTE) SARS-CoV-2 target nucleic acids are NOT DETECTED. The SARS-CoV-2 RNA is generally detectable in upper and lower respiratory specimens during the acute phase of infection. The lowest concentration of SARS-CoV-2 viral copies this assay can detect is 250 copies / mL. A negative result does not preclude SARS-CoV-2 infection and should not be used as the sole basis for treatment or other patient management decisions.  A negative result may occur with improper specimen collection / handling, submission of specimen other than nasopharyngeal swab, presence of viral mutation(s) within the areas targeted by this assay, and inadequate number of viral copies (<250 copies / mL). A negative result must be combined with clinical observations, patient history, and epidemiological information. Fact Sheet for Patients:   StrictlyIdeas.no Fact Sheet for Healthcare Providers: BankingDealers.co.za  This test is not yet approved or cleared  by the [place] and has been authorized for detection and/or diagnosis of SARS-CoV-2 by FDA under an Emergency Use Authorization (EUA).  This EUA will remain in effect (meaning this test can be used) for the duration of the COVID-19 declaration under Section 564(b)(1) of the Act, 21 U.S.C. section 360bbb-3(b)(1), unless the authorization is terminated or revoked sooner. Performed at [facility], [address] [place], [place] [address]   Culture, sputum-assessment     Status: None   Collection Time:  08/19/19  1:11 AM   Specimen: Sputum  Result Value Ref Range Status   Specimen Description SPUTUM  Final   Special Requests NONE  Final   Sputum evaluation   Final    Sputum specimen not acceptable for testing.  Please recollect.   C/CHARLIE FLEETWOOD AT 6045 08/19/19.PMF Performed at [facility], [address] [place], [place] [address]    Report Status 08/19/2019 FINAL  Final  MRSA PCR Screening     Status: None   Collection Time: 08/19/19  1:30 AM   Specimen: Nasal Mucosa; Nasopharyngeal  Result Value Ref Range Status   MRSA by PCR NEGATIVE NEGATIVE Final    Comment:        The GeneXpert MRSA Assay (FDA approved for NASAL specimens only), is one component of a comprehensive MRSA colonization surveillance program. It is not intended to diagnose MRSA infection nor to guide or monitor treatment for MRSA infections. Performed at [facility], [address] [place], [place] [address]   Gastrointestinal Panel by PCR , Stool     Status: None   Collection Time: 08/19/19 12:56 PM   Specimen: Stool  Result Value Ref Range Status   Campylobacter species NOT DETECTED NOT DETECTED Final   Plesimonas shigelloides NOT DETECTED NOT DETECTED Final   Salmonella species NOT DETECTED NOT DETECTED Final   Yersinia enterocolitica NOT DETECTED NOT DETECTED Final   Vibrio species NOT DETECTED NOT DETECTED Final   Vibrio cholerae NOT DETECTED NOT DETECTED Final   Enteroaggregative E coli (EAEC) NOT DETECTED NOT DETECTED Final   Enteropathogenic E coli (EPEC) NOT DETECTED NOT DETECTED Final   Enterotoxigenic E coli (ETEC) NOT DETECTED NOT DETECTED Final   Shiga like toxin producing E coli (STEC) NOT DETECTED NOT DETECTED Final   Shigella/Enteroinvasive E coli (EIEC) NOT DETECTED NOT DETECTED Final   Cryptosporidium NOT DETECTED NOT DETECTED Final   Cyclospora cayetanensis NOT DETECTED NOT DETECTED Final   Entamoeba histolytica NOT DETECTED NOT DETECTED Final   Giardia  lamblia NOT DETECTED NOT DETECTED Final   Adenovirus F40/41 NOT DETECTED NOT DETECTED Final   Astrovirus NOT DETECTED NOT DETECTED Final   Norovirus GI/GII NOT DETECTED NOT DETECTED Final   Rotavirus A NOT DETECTED NOT DETECTED Final   Sapovirus (I, II, IV, and V) NOT DETECTED NOT DETECTED Final    Comment: Performed at [facility], [address], [place], [place] [address]  Urine culture     Status: None   Collection Time: 08/20/19  9:24 AM   Specimen: Urine  Result Value Ref Range Status   Specimen Description   Final    IN/OUT CATH URINE Performed at [facility], [address], [place], [place] [address]    Special Requests   Final    NONE Performed at [facility], [address], [place], [place] [address]    Culture   Final    NO  GROWTH Performed at [facility], [place] [address], [place], [place] [address]    Report Status 08/21/2019 FINAL  Final  C Difficile Quick Screen w PCR reflex     Status: None   Collection Time: 08/20/19 12:01 PM   Specimen: STOOL  Result Value Ref Range Status   C Diff antigen NEGATIVE NEGATIVE Final   C Diff toxin NEGATIVE NEGATIVE Final   C Diff interpretation No C. difficile detected.  Final    Comment: Performed at [facility], [address] [place], [place] [address]  CULTURE, BLOOD (ROUTINE X 2) w Reflex to ID Panel     Status: None (Preliminary result)   Collection Time: 08/20/19  1:07 PM   Specimen: BLOOD  Result Value Ref Range Status   Specimen Description BLOOD [facility]  Final   Special Requests   Final    BOTTLES DRAWN AEROBIC AND ANAEROBIC Blood Culture adequate volume   Culture   Final    NO GROWTH 3 DAYS Performed at [facility], [address], [place], [place] [address]    Report Status PENDING  Incomplete  CULTURE, BLOOD (ROUTINE X 2) w Reflex to ID Panel     Status: None (Preliminary result)   Collection Time: 08/20/19  1:22 PM   Specimen: BLOOD  Result Value Ref  Range Status   Specimen Description BLOOD [facility]  Final   Special Requests   Final    BOTTLES DRAWN AEROBIC AND ANAEROBIC Blood Culture adequate volume   Culture   Final    NO GROWTH 3 DAYS Performed at [facility], [address] [place], [place] [address]    Report Status PENDING  Incomplete     Studies: ECHO TEE  Result Date: 08/23/2019 Kate Sable, MD     08/23/2019  9:56 AM Transesophageal Echocardiogram : Indication: bacteremia Procedure: 10cc of viscous lidocaine were given orally to provide local anesthesia to the oropharynx. The patient was positioned supine on the left side, bite block provided. The patient was moderately sedated with the doses of versed and fentanyl as detailed below.  Using digital technique an omniplane probe was advanced into the esophagus without incident. Moderate sedation: 1. Sedation used:  Versed: 2, Fentanyl: 75 2. Time administered: 9:10 .  Time when patient started recovery: 9:35 3. I was face to face during this time, 25 minutes See report in EPIC  for complete details: In brief, imaging revealed normal LV function with no RWMAs and no mural apical thrombus.  .  Estimated ejection fraction was 55%.  Imaging of the septum showed no ASD or VSD 2D and color flow confirmed no PFO There was no evidence for vegetation in the cardiac valves or ICD device The descending thoracic aorta had no  mural aortic debris with no evidence of aneurysmal dilation or disection Aaron Edelman Agbor-Etang 08/23/2019 9:52 AM   Scheduled Meds: . fentaNYL      . ferrous sulfate  325 mg Oral Daily  . ipratropium-albuterol  3 mL Nebulization Q6H  . lidocaine      . midazolam      . multivitamin with minerals  1 tablet Oral Daily  . pantoprazole (PROTONIX) IV  40 mg Intravenous Q12H  . simvastatin  20 mg Oral Daily  . sodium chloride flush      . spironolactone  25 mg Oral Daily   Continuous Infusions: . sodium chloride 250 mL (08/23/19 0343)  . cefTRIAXone (ROCEPHIN)  IV  2 g (08/23/19 0343)  Assessment/Plan:   1. Septic shock secondary to group B streptococcus sepsis and pneumonia.  TEE negative for vegetation.  The patient had a prior episode of group B streptococcus sepsis back in April and required 4 weeks of IV antibiotics at that time.  This time infectious disease doctor recommended removing the AICD.  Patient on Rocephin.  Repeat blood cultures negative.  Infectious disease specialist to make final recommendations and note today but will need 4 weeks of IV antibiotics.  She recommends repeating a blood cultures after AICD removal and waiting 2 days prior to placing PICC line.  Patient can follow-up with Dr. [name] for as outpatient. 2. Acute blood loss anemia with iron deficiency.  The patient received IV iron during the hospital course.  Patient on IV Protonix here.  Blood thinners held.  Patient had a small bowel capsule endoscopy which did not show any source of bleeding.  Evaluated by GI.  Last hemoglobin 9.0. 3. Chronic systolic congestive heart failure.  No signs of congestive heart failure currently.  Can restart Cozaar and torsemide tomorrow. 4. Hypokalemia on Aldactone 5. Essential hypertension.  Patient can go back on Cozaar and torsemide tomorrow.  Already on Aldactone 6. History of stroke with permanent atrial fibrillation.  Not a candidate for anticoagulation at this time 7. Acute kidney injury.  This has improved. 8. Stage II decubitus present on admission.  See description below  Pressure Injury 06/21/19 Buttocks Mid Stage 2 -  Partial thickness loss of dermis presenting as a shallow open injury with a red, pink wound bed without slough. (Active)  06/21/19 0700  Location: Buttocks  Location Orientation: Mid  Staging: Stage 2 -  Partial thickness loss of dermis presenting as a shallow open injury with a red, pink wound bed without slough.  Wound Description (Comments):   Present on Admission:        Code Status:     Code  Status Orders  (From admission, onward)         Start     Ordered   08/19/19 0111  Full code  Continuous     08/19/19 0110        Code Status History    Date Active Date Inactive Code Status Order ID Comments User Context   06/17/2019 0007 06/25/2019 1925 Full Code [id number]  Gwynne Edinger, MD ED   05/19/2019 1918 05/30/2019 2312 Full Code [id number]  Vashti Hey, MD ED   09/14/2018 0603 09/16/2018 1641 Full Code [id number]  Harrie Foreman, MD Inpatient   08/12/2018 0344 08/17/2018 1736 Full Code [id number]  Etta Quill, DO ED   05/25/2018 2042 05/28/2018 1654 Full Code [id number]  Vianne Bulls, MD ED   07/15/2016 0929 07/15/2016 1636 Full Code [id number]  Bettey Costa, MD Inpatient   10/31/2015 1444 11/01/2015 1921 Full Code [id number]  Nicholes Mango, MD Inpatient   09/25/2013 1137 09/26/2013 1502 Full Code [id number]  Sherren Mocha, MD Inpatient   09/21/2013 1444 09/25/2013 1137 Full Code [id number]  Sherren Mocha, MD Inpatient   12/16/2012 1648 12/17/2012 1639 Full Code [id number]  [name], MD Inpatient   Advance Care Planning Activity     Family Communication: Spoke with son on the phone Disposition Plan: Status is: Inpatient   Dispo: The patient is from: Home              Anticipated d/c is to: [facility]              Anticipated  d/c date is: Today or tomorrow depending on bed availability to [facility]              Patient currently requires IV antibiotics.  Patient will be transferred to [facility] to have AICD removed.  Consultants:  Cardiology  Gastroenterology  Antibiotics:  Rocephin  Time spent: 35 minutes  [place]

## 2019-08-23 NOTE — Sedation Documentation (Signed)
Patient is resting comfortably. 

## 2019-08-24 ENCOUNTER — Inpatient Hospital Stay (HOSPITAL_COMMUNITY)
Admission: AD | Admit: 2019-08-24 | Discharge: 2019-09-12 | DRG: 226 | Disposition: A | Discharge status: Skilled Nursing Facility | Payer: PPO | Source: Other Acute Inpatient Hospital | Attending: Student | Admitting: Student

## 2019-08-24 ENCOUNTER — Encounter (HOSPITAL_COMMUNITY): Payer: Self-pay | Admitting: Cardiovascular Disease

## 2019-08-24 ENCOUNTER — Inpatient Hospital Stay (HOSPITAL_COMMUNITY): Payer: PPO

## 2019-08-24 DIAGNOSIS — E8809 Other disorders of plasma-protein metabolism, not elsewhere classified: Secondary | ICD-10-CM | POA: Diagnosis not present

## 2019-08-24 DIAGNOSIS — B372 Candidiasis of skin and nail: Secondary | ICD-10-CM | POA: Diagnosis present

## 2019-08-24 DIAGNOSIS — I509 Heart failure, unspecified: Secondary | ICD-10-CM | POA: Diagnosis not present

## 2019-08-24 DIAGNOSIS — K5900 Constipation, unspecified: Secondary | ICD-10-CM | POA: Diagnosis present

## 2019-08-24 DIAGNOSIS — M6281 Muscle weakness (generalized): Secondary | ICD-10-CM | POA: Diagnosis not present

## 2019-08-24 DIAGNOSIS — I2510 Atherosclerotic heart disease of native coronary artery without angina pectoris: Secondary | ICD-10-CM | POA: Diagnosis present

## 2019-08-24 DIAGNOSIS — N179 Acute kidney failure, unspecified: Secondary | ICD-10-CM | POA: Diagnosis not present

## 2019-08-24 DIAGNOSIS — Z743 Need for continuous supervision: Secondary | ICD-10-CM | POA: Diagnosis not present

## 2019-08-24 DIAGNOSIS — J811 Chronic pulmonary edema: Secondary | ICD-10-CM | POA: Diagnosis not present

## 2019-08-24 DIAGNOSIS — I330 Acute and subacute infective endocarditis: Secondary | ICD-10-CM | POA: Diagnosis not present

## 2019-08-24 DIAGNOSIS — K045 Chronic apical periodontitis: Secondary | ICD-10-CM | POA: Diagnosis present

## 2019-08-24 DIAGNOSIS — R600 Localized edema: Secondary | ICD-10-CM

## 2019-08-24 DIAGNOSIS — J9811 Atelectasis: Secondary | ICD-10-CM | POA: Diagnosis not present

## 2019-08-24 DIAGNOSIS — R6521 Severe sepsis with septic shock: Secondary | ICD-10-CM | POA: Diagnosis present

## 2019-08-24 DIAGNOSIS — Z452 Encounter for adjustment and management of vascular access device: Secondary | ICD-10-CM

## 2019-08-24 DIAGNOSIS — I5022 Chronic systolic (congestive) heart failure: Secondary | ICD-10-CM | POA: Diagnosis not present

## 2019-08-24 DIAGNOSIS — I890 Lymphedema, not elsewhere classified: Secondary | ICD-10-CM | POA: Diagnosis not present

## 2019-08-24 DIAGNOSIS — D62 Acute posthemorrhagic anemia: Secondary | ICD-10-CM | POA: Diagnosis not present

## 2019-08-24 DIAGNOSIS — I25708 Atherosclerosis of coronary artery bypass graft(s), unspecified, with other forms of angina pectoris: Secondary | ICD-10-CM | POA: Diagnosis present

## 2019-08-24 DIAGNOSIS — I493 Ventricular premature depolarization: Secondary | ICD-10-CM | POA: Diagnosis not present

## 2019-08-24 DIAGNOSIS — Z87891 Personal history of nicotine dependence: Secondary | ICD-10-CM

## 2019-08-24 DIAGNOSIS — I482 Chronic atrial fibrillation, unspecified: Secondary | ICD-10-CM | POA: Diagnosis not present

## 2019-08-24 DIAGNOSIS — K449 Diaphragmatic hernia without obstruction or gangrene: Secondary | ICD-10-CM | POA: Diagnosis present

## 2019-08-24 DIAGNOSIS — I252 Old myocardial infarction: Secondary | ICD-10-CM

## 2019-08-24 DIAGNOSIS — I82612 Acute embolism and thrombosis of superficial veins of left upper extremity: Secondary | ICD-10-CM | POA: Diagnosis present

## 2019-08-24 DIAGNOSIS — Z950 Presence of cardiac pacemaker: Secondary | ICD-10-CM

## 2019-08-24 DIAGNOSIS — Z8249 Family history of ischemic heart disease and other diseases of the circulatory system: Secondary | ICD-10-CM

## 2019-08-24 DIAGNOSIS — J181 Lobar pneumonia, unspecified organism: Secondary | ICD-10-CM | POA: Diagnosis not present

## 2019-08-24 DIAGNOSIS — R6520 Severe sepsis without septic shock: Secondary | ICD-10-CM | POA: Diagnosis not present

## 2019-08-24 DIAGNOSIS — A401 Sepsis due to streptococcus, group B: Secondary | ICD-10-CM | POA: Diagnosis not present

## 2019-08-24 DIAGNOSIS — I4821 Permanent atrial fibrillation: Secondary | ICD-10-CM

## 2019-08-24 DIAGNOSIS — E782 Mixed hyperlipidemia: Secondary | ICD-10-CM | POA: Diagnosis not present

## 2019-08-24 DIAGNOSIS — R5381 Other malaise: Secondary | ICD-10-CM | POA: Diagnosis not present

## 2019-08-24 DIAGNOSIS — R7881 Bacteremia: Secondary | ICD-10-CM

## 2019-08-24 DIAGNOSIS — B958 Unspecified staphylococcus as the cause of diseases classified elsewhere: Secondary | ICD-10-CM | POA: Diagnosis not present

## 2019-08-24 DIAGNOSIS — I5023 Acute on chronic systolic (congestive) heart failure: Secondary | ICD-10-CM | POA: Diagnosis not present

## 2019-08-24 DIAGNOSIS — H905 Unspecified sensorineural hearing loss: Secondary | ICD-10-CM | POA: Diagnosis not present

## 2019-08-24 DIAGNOSIS — K31819 Angiodysplasia of stomach and duodenum without bleeding: Secondary | ICD-10-CM | POA: Diagnosis not present

## 2019-08-24 DIAGNOSIS — I10 Essential (primary) hypertension: Secondary | ICD-10-CM | POA: Diagnosis present

## 2019-08-24 DIAGNOSIS — I255 Ischemic cardiomyopathy: Secondary | ICD-10-CM | POA: Diagnosis not present

## 2019-08-24 DIAGNOSIS — L89312 Pressure ulcer of right buttock, stage 2: Secondary | ICD-10-CM | POA: Diagnosis present

## 2019-08-24 DIAGNOSIS — T827XXD Infection and inflammatory reaction due to other cardiac and vascular devices, implants and grafts, subsequent encounter: Secondary | ICD-10-CM | POA: Diagnosis not present

## 2019-08-24 DIAGNOSIS — Z9861 Coronary angioplasty status: Secondary | ICD-10-CM

## 2019-08-24 DIAGNOSIS — Z419 Encounter for procedure for purposes other than remedying health state, unspecified: Secondary | ICD-10-CM

## 2019-08-24 DIAGNOSIS — Z95810 Presence of automatic (implantable) cardiac defibrillator: Secondary | ICD-10-CM

## 2019-08-24 DIAGNOSIS — I472 Ventricular tachycardia: Secondary | ICD-10-CM | POA: Diagnosis not present

## 2019-08-24 DIAGNOSIS — R1310 Dysphagia, unspecified: Secondary | ICD-10-CM | POA: Diagnosis present

## 2019-08-24 DIAGNOSIS — K0530 Chronic periodontitis, unspecified: Secondary | ICD-10-CM | POA: Diagnosis not present

## 2019-08-24 DIAGNOSIS — Z20822 Contact with and (suspected) exposure to covid-19: Secondary | ICD-10-CM | POA: Diagnosis not present

## 2019-08-24 DIAGNOSIS — I442 Atrioventricular block, complete: Secondary | ICD-10-CM | POA: Diagnosis not present

## 2019-08-24 DIAGNOSIS — R0902 Hypoxemia: Secondary | ICD-10-CM | POA: Diagnosis not present

## 2019-08-24 DIAGNOSIS — K029 Dental caries, unspecified: Secondary | ICD-10-CM | POA: Diagnosis not present

## 2019-08-24 DIAGNOSIS — H9190 Unspecified hearing loss, unspecified ear: Secondary | ICD-10-CM | POA: Diagnosis not present

## 2019-08-24 DIAGNOSIS — R1312 Dysphagia, oropharyngeal phase: Secondary | ICD-10-CM | POA: Diagnosis not present

## 2019-08-24 DIAGNOSIS — I5033 Acute on chronic diastolic (congestive) heart failure: Secondary | ICD-10-CM | POA: Diagnosis not present

## 2019-08-24 DIAGNOSIS — L89322 Pressure ulcer of left buttock, stage 2: Secondary | ICD-10-CM | POA: Diagnosis present

## 2019-08-24 DIAGNOSIS — I110 Hypertensive heart disease with heart failure: Secondary | ICD-10-CM | POA: Diagnosis not present

## 2019-08-24 DIAGNOSIS — J9601 Acute respiratory failure with hypoxia: Secondary | ICD-10-CM | POA: Diagnosis not present

## 2019-08-24 DIAGNOSIS — K31811 Angiodysplasia of stomach and duodenum with bleeding: Secondary | ICD-10-CM | POA: Diagnosis present

## 2019-08-24 DIAGNOSIS — Z9079 Acquired absence of other genital organ(s): Secondary | ICD-10-CM

## 2019-08-24 DIAGNOSIS — J189 Pneumonia, unspecified organism: Secondary | ICD-10-CM

## 2019-08-24 DIAGNOSIS — Z8546 Personal history of malignant neoplasm of prostate: Secondary | ICD-10-CM

## 2019-08-24 DIAGNOSIS — D500 Iron deficiency anemia secondary to blood loss (chronic): Secondary | ICD-10-CM | POA: Diagnosis present

## 2019-08-24 DIAGNOSIS — B951 Streptococcus, group B, as the cause of diseases classified elsewhere: Secondary | ICD-10-CM | POA: Diagnosis not present

## 2019-08-24 DIAGNOSIS — M6259 Muscle wasting and atrophy, not elsewhere classified, multiple sites: Secondary | ICD-10-CM | POA: Diagnosis not present

## 2019-08-24 DIAGNOSIS — J810 Acute pulmonary edema: Secondary | ICD-10-CM | POA: Diagnosis not present

## 2019-08-24 DIAGNOSIS — K083 Retained dental root: Secondary | ICD-10-CM | POA: Diagnosis present

## 2019-08-24 DIAGNOSIS — R079 Chest pain, unspecified: Secondary | ICD-10-CM | POA: Diagnosis not present

## 2019-08-24 DIAGNOSIS — Z8679 Personal history of other diseases of the circulatory system: Secondary | ICD-10-CM

## 2019-08-24 DIAGNOSIS — N1831 Chronic kidney disease, stage 3a: Secondary | ICD-10-CM

## 2019-08-24 DIAGNOSIS — J153 Pneumonia due to streptococcus, group B: Secondary | ICD-10-CM | POA: Diagnosis present

## 2019-08-24 DIAGNOSIS — I517 Cardiomegaly: Secondary | ICD-10-CM | POA: Diagnosis not present

## 2019-08-24 DIAGNOSIS — I130 Hypertensive heart and chronic kidney disease with heart failure and stage 1 through stage 4 chronic kidney disease, or unspecified chronic kidney disease: Secondary | ICD-10-CM | POA: Diagnosis not present

## 2019-08-24 DIAGNOSIS — R0602 Shortness of breath: Secondary | ICD-10-CM | POA: Diagnosis not present

## 2019-08-24 DIAGNOSIS — Z8711 Personal history of peptic ulcer disease: Secondary | ICD-10-CM

## 2019-08-24 DIAGNOSIS — R601 Generalized edema: Secondary | ICD-10-CM | POA: Diagnosis not present

## 2019-08-24 DIAGNOSIS — T82847A Pain from cardiac prosthetic devices, implants and grafts, initial encounter: Secondary | ICD-10-CM | POA: Diagnosis not present

## 2019-08-24 DIAGNOSIS — Z0189 Encounter for other specified special examinations: Secondary | ICD-10-CM

## 2019-08-24 DIAGNOSIS — I714 Abdominal aortic aneurysm, without rupture: Secondary | ICD-10-CM | POA: Diagnosis present

## 2019-08-24 DIAGNOSIS — Y848 Other medical procedures as the cause of abnormal reaction of the patient, or of later complication, without mention of misadventure at the time of the procedure: Secondary | ICD-10-CM | POA: Diagnosis not present

## 2019-08-24 DIAGNOSIS — R279 Unspecified lack of coordination: Secondary | ICD-10-CM | POA: Diagnosis not present

## 2019-08-24 DIAGNOSIS — J9600 Acute respiratory failure, unspecified whether with hypoxia or hypercapnia: Secondary | ICD-10-CM

## 2019-08-24 DIAGNOSIS — Z79899 Other long term (current) drug therapy: Secondary | ICD-10-CM

## 2019-08-24 DIAGNOSIS — R937 Abnormal findings on diagnostic imaging of other parts of musculoskeletal system: Secondary | ICD-10-CM | POA: Diagnosis not present

## 2019-08-24 DIAGNOSIS — J90 Pleural effusion, not elsewhere classified: Secondary | ICD-10-CM

## 2019-08-24 DIAGNOSIS — R41841 Cognitive communication deficit: Secondary | ICD-10-CM | POA: Diagnosis not present

## 2019-08-24 DIAGNOSIS — Z8673 Personal history of transient ischemic attack (TIA), and cerebral infarction without residual deficits: Secondary | ICD-10-CM

## 2019-08-24 DIAGNOSIS — I4430 Unspecified atrioventricular block: Secondary | ICD-10-CM | POA: Diagnosis present

## 2019-08-24 DIAGNOSIS — R609 Edema, unspecified: Secondary | ICD-10-CM | POA: Diagnosis not present

## 2019-08-24 DIAGNOSIS — H9193 Unspecified hearing loss, bilateral: Secondary | ICD-10-CM | POA: Diagnosis not present

## 2019-08-24 DIAGNOSIS — E876 Hypokalemia: Secondary | ICD-10-CM | POA: Diagnosis not present

## 2019-08-24 DIAGNOSIS — T827XXA Infection and inflammatory reaction due to other cardiac and vascular devices, implants and grafts, initial encounter: Secondary | ICD-10-CM | POA: Diagnosis not present

## 2019-08-24 DIAGNOSIS — A419 Sepsis, unspecified organism: Secondary | ICD-10-CM | POA: Diagnosis not present

## 2019-08-24 DIAGNOSIS — M264 Malocclusion, unspecified: Secondary | ICD-10-CM | POA: Diagnosis present

## 2019-08-24 DIAGNOSIS — M19011 Primary osteoarthritis, right shoulder: Secondary | ICD-10-CM | POA: Diagnosis present

## 2019-08-24 HISTORY — DX: Streptococcus, group b, as the cause of diseases classified elsewhere: B95.1

## 2019-08-24 HISTORY — DX: Bacteremia: R78.81

## 2019-08-24 LAB — RENAL FUNCTION PANEL
Albumin: 1.6 g/dL — ABNORMAL LOW (ref 3.5–5.0)
Anion gap: 5 (ref 5–15)
BUN: 12 mg/dL (ref 8–23)
CO2: 28 mmol/L (ref 22–32)
Calcium: 7.5 mg/dL — ABNORMAL LOW (ref 8.9–10.3)
Chloride: 108 mmol/L (ref 98–111)
Creatinine, Ser: 1.06 mg/dL (ref 0.61–1.24)
GFR calc Af Amer: >60 mL/min (ref >60)
GFR calc non Af Amer: >60 mL/min (ref >60)
Glucose, Bld: 116 mg/dL — ABNORMAL HIGH (ref 70–99)
Phosphorus: 2.3 mg/dL — ABNORMAL LOW (ref 2.5–4.6)
Potassium: 3.5 mmol/L (ref 3.5–5.1)
Sodium: 141 mmol/L (ref 135–145)

## 2019-08-24 LAB — CBC WITH DIFFERENTIAL/PLATELET
Abs Immature Granulocytes: 0.08 10*3/uL — ABNORMAL HIGH (ref 0.00–0.07)
Basophils Absolute: 0 10*3/uL (ref 0.0–0.1)
Basophils Relative: 1 %
Eosinophils Absolute: 0.2 10*3/uL (ref 0.0–0.5)
Eosinophils Relative: 3 %
HCT: 29.2 % — ABNORMAL LOW (ref 39.0–52.0)
Hemoglobin: 8.6 g/dL — ABNORMAL LOW (ref 13.0–17.0)
Immature Granulocytes: 1 %
Lymphocytes Relative: 12 %
Lymphs Abs: 0.7 10*3/uL (ref 0.7–4.0)
MCH: 24.6 pg — ABNORMAL LOW (ref 26.0–34.0)
MCHC: 29.5 g/dL — ABNORMAL LOW (ref 30.0–36.0)
MCV: 83.4 fL (ref 80.0–100.0)
Monocytes Absolute: 0.8 10*3/uL (ref 0.1–1.0)
Monocytes Relative: 13 %
Neutro Abs: 4.4 10*3/uL (ref 1.7–7.7)
Neutrophils Relative %: 70 %
Platelets: 222 10*3/uL (ref 150–400)
RBC: 3.5 MIL/uL — ABNORMAL LOW (ref 4.22–5.81)
RDW: 19.8 % — ABNORMAL HIGH (ref 11.5–15.5)
WBC: 6.2 10*3/uL (ref 4.0–10.5)
nRBC: 0 % (ref 0.0–0.2)

## 2019-08-24 LAB — PROTIME-INR
INR: 1.3 — ABNORMAL HIGH (ref 0.8–1.2)
Prothrombin Time: 15.5 seconds — ABNORMAL HIGH (ref 11.4–15.2)

## 2019-08-24 LAB — C-REACTIVE PROTEIN: CRP: 1.4 mg/dL — ABNORMAL HIGH (ref <1.0)

## 2019-08-24 LAB — BRAIN NATRIURETIC PEPTIDE: B Natriuretic Peptide: 480.2 pg/mL — ABNORMAL HIGH (ref 0.0–100.0)

## 2019-08-24 LAB — APTT: aPTT: 58 seconds — ABNORMAL HIGH (ref 24–36)

## 2019-08-24 LAB — MAGNESIUM: Magnesium: 2.1 mg/dL (ref 1.7–2.4)

## 2019-08-24 MED ORDER — IPRATROPIUM-ALBUTEROL 0.5-2.5 (3) MG/3ML IN SOLN
3.0000 mL | Freq: Three times a day (TID) | RESPIRATORY_TRACT | Status: DC
  Administered 2019-08-25: 3 mL via RESPIRATORY_TRACT
  Filled 2019-08-24: qty 3

## 2019-08-24 MED ORDER — LOSARTAN POTASSIUM 25 MG PO TABS
12.5000 mg | ORAL_TABLET | Freq: Every day | ORAL | Status: DC
  Administered 2019-08-24: 12.5 mg via ORAL
  Filled 2019-08-24: qty 1

## 2019-08-24 MED ORDER — FUROSEMIDE 10 MG/ML IJ SOLN
40.0000 mg | Freq: Once | INTRAMUSCULAR | Status: AC
  Administered 2019-08-25: 40 mg via INTRAVENOUS
  Filled 2019-08-24: qty 4

## 2019-08-24 MED ORDER — GERHARDT'S BUTT CREAM
TOPICAL_CREAM | Freq: Two times a day (BID) | CUTANEOUS | Status: DC
  Administered 2019-08-31 – 2019-09-11 (×4): 1 via TOPICAL
  Filled 2019-08-24 (×3): qty 1

## 2019-08-24 NOTE — Progress Notes (Signed)
Progress Note  Patient Name: Thomas [name] Date of Encounter: 08/24/2019  [facility] Cardiologist: Sanda Klein, MD   Subjective   No complaints this morning, hungry Orders have been placed to transfer to [facility] for extraction of device Breathing stable, on oxygen Chronic cough  Inpatient Medications    Scheduled Meds:  ferrous sulfate  325 mg Oral Daily   ipratropium-albuterol  3 mL Nebulization Q6H   multivitamin with minerals  1 tablet Oral Daily   pantoprazole (PROTONIX) IV  40 mg Intravenous Q12H   simvastatin  20 mg Oral Daily   spironolactone  25 mg Oral Daily   Continuous Infusions:  sodium chloride 250 mL (08/23/19 0343)   cefTRIAXone (ROCEPHIN)  IV 2 g (08/24/19 0234)   PRN Meds: acetaminophen, albuterol, dextromethorphan-guaiFENesin, ondansetron (ZOFRAN) IV   Vital Signs    Vitals:   08/24/19 0342 08/24/19 0740 08/24/19 1000 08/24/19 1228  BP: 116/66  115/69 126/66  Pulse: 67  79 70  Resp: 20  20 20   Temp: 98.3 F (36.8 C)  98.2 F (36.8 C) 97.8 F (36.6 C)  TempSrc: Oral  Oral Oral  SpO2: 97% 98% 96% 100%  Weight:      Height:        Intake/Output Summary (Last 24 hours) at 08/24/2019 1305 Last data filed at 08/24/2019 0251 Gross per 24 hour  Intake 0 ml  Output 650 ml  Net -650 ml   Last 3 Weights 08/23/2019 08/19/2019 08/18/2019  Weight (lbs) 203 lb 4.2 oz 203 lb 4.2 oz 197 lb 4.8 oz  Weight (kg) 92.2 kg 92.2 kg 89.495 kg      Telemetry    Paced rhythm- Personally Reviewed  ECG     - Personally Reviewed  Physical Exam   GEN: No acute distress.   Neck: No JVD Cardiac: RRR, no murmurs, rubs, or gallops.  Respiratory: Scattered Rales, decreased breath sounds throughout GI: Soft, nontender, non-distended  MS: No edema; No deformity. Neuro:  Nonfocal  Psych: Normal affect, hard of hearing  Labs    High Sensitivity Troponin:  No results for input(s): TROPONINIHS in the last 720 hours.    Chemistry Recent Labs  Lab  08/18/19 1353 08/19/19 0227 08/21/19 0504 08/22/19 0320 08/23/19 0448  NA 139   < > 141 141 145  K 3.5   < > 3.3* 3.2* 3.5  CL 99   < > 108 107 111  CO2 28   < > 28 27 29   GLUCOSE 115*   < > 103* 96 86  BUN 57*   < > 48* 31* 20  CREATININE 2.34*   < > 1.42* 1.07 0.88  CALCIUM 7.9*   < > 7.8* 7.8* 7.6*  PROT 4.9*  --   --   --   --   ALBUMIN 1.9*  --   --   --   --   AST 46*  --   --   --   --   ALT 18  --   --   --   --   ALKPHOS 87  --   --   --   --   BILITOT 1.1  --   --   --   --   GFRNONAA 25*   < > 46* >60 >60  GFRAA 29*   < > 53* >60 >60  ANIONGAP 12   < > 5 7 5    < > = values in this interval not displayed.  Hematology Recent Labs  Lab 08/20/19 0446 08/20/19 1322 08/21/19 1749 08/22/19 0320 08/23/19 0448  WBC 9.2  --   --  7.1 6.3  RBC 3.43*  --   --  3.64* 3.63*  HGB 8.7*   < > 9.3* 8.8* 9.0*  HCT 26.5*  --   --  29.4* 28.8*  MCV 77.3*  --   --  80.8 79.3*  MCH 25.4*  --   --  24.2* 24.8*  MCHC 32.8  --   --  29.9* 31.3  RDW 18.6*  --   --  18.4* 18.8*  PLT 131*  --   --  160 180   < > = values in this interval not displayed.    BNP Recent Labs  Lab 08/18/19 1353  BNP 588.8*     DDimer No results for input(s): DDIMER in the last 168 hours.   Radiology    ECHO TEE  Result Date: 08/23/2019    TRANSESOPHOGEAL ECHO REPORT   Patient Name:   IRIE FIORELLO Date of Exam: 08/23/2019 Medical Rec #:  [id number]      Height:       74.0 in Accession #:    [id number]     Weight:       203.3 lb Date of Birth:  19-Oct-1934     BSA:          2.188 m Patient Age:    85 years       BP:           108/62 mmHg Patient Gender: M              HR:           58 bpm. Exam Location:  [facility] Procedure: Transesophageal Echo, Cardiac Doppler and Color Doppler Indications:     Not listed on order  History:         Patient has prior history of Echocardiogram examinations, most                  recent 08/21/2019. CAD, Prior CABG and AICD; Risk                  Factors:Hypertension. PAF.   Sonographer:     Sherrie Sport RDCS (AE) Referring Phys:  [id number] Kate Sable Diagnosing Phys: Kate Sable MD PROCEDURE: The transesophogeal probe was passed without difficulty through the esophogus of the patient. Sedation performed by performing physician. The patient developed no complications during the procedure. IMPRESSIONS  1. Left ventricular ejection fraction, by estimation, is 55 to 60%. The left ventricle has normal function. The left ventricle has no regional wall motion abnormalities.  2. Right ventricular systolic function is normal. The right ventricular size is normal.  3. Left atrial size was mild to moderately dilated. No left atrial/left atrial appendage thrombus was detected.  4. Right atrial size was mildly dilated.  5. The mitral valve is normal in structure. Mild mitral valve regurgitation.  6. Tricuspid valve regurgitation is moderate to severe.  7. The aortic valve is tricuspid. Aortic valve regurgitation is mild. Conclusion(s)/Recommendation(s): No evidence of vegetation/infective endocarditis on this transesophageal echocardiogram. FINDINGS  Left Ventricle: Left ventricular ejection fraction, by estimation, is 55 to 60%. The left ventricle has normal function. The left ventricle has no regional wall motion abnormalities. The left ventricular internal cavity size was normal in size. There is  no left ventricular hypertrophy. Right Ventricle: The right ventricular size is normal. No increase in right ventricular  wall thickness. Right ventricular systolic function is normal. Left Atrium: Left atrial size was mild to moderately dilated. No left atrial/left atrial appendage thrombus was detected. Right Atrium: Right atrial size was mildly dilated. Pericardium: There is no evidence of pericardial effusion. Mitral Valve: The mitral valve is normal in structure. Mild mitral valve regurgitation. Tricuspid Valve: The tricuspid valve is grossly normal. Tricuspid valve regurgitation is  moderate to severe. Aortic Valve: The aortic valve is tricuspid. Aortic valve regurgitation is mild. Pulmonic Valve: The pulmonic valve was not well visualized. Pulmonic valve regurgitation is trivial. Aorta: The aortic root is normal in size and structure. Venous: The inferior vena cava was not well visualized. IAS/Shunts: No atrial level shunt detected by color flow Doppler. Additional Comments: A pacer wire is visualized. Kate Sable MD Electronically signed by Kate Sable MD Signature Date/Time: 08/23/2019/4:10:21 PM    Final     Cardiac Studies     Patient Profile     TERRIN MEDDAUGH is a 85 y.o. 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 due to CHB with change-out 2007, 2014 [employer] CRT-D due to EF 30% despite GDMT, multiple catheterizations / PCIs, PCI/BMS placed SVG-OM 2015, known CTO of the SVG-RCA, HFrEF with recovery of EF by most recent  (55-60%, 08/2019), lymphedema, AAA and aortic root dilation, remote strokes as seen on CT imaging, permanent Afib not on [place] due to GIB, s/p transfusion / known AVMs / falls, anemia, CKDIII, prostate CA, HTN, HLD, and transferred today from [facility] to [facility] for recurrent bacteremia/ group B streptococcus with TEE without evidence of vegetations though ID / EP recommendation for extraction of device.  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 --TEE 08/23/19 without evidence of vegetation on device or cardiac valves.--ID recommendation for extraction of cardiac device given recurrent strep B infection. --- recommendation for transfer to [facility] for extraction of device.  -- ceftriaxone 2g IV over 27m q24h to start at time of transfer. Continue ceftriaxone as outlined by ID  CAD s/p CABG and PCI   No current plans for ischemic evaluation. 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  Permanent Afib  Not on Anticoagulation due to recurrent GIB --V paced (noted to be 100%).   Not on [place] as detailed above CHA2DS2VASc score of 7 (CHF, HTN, age x2, stroke x2, vascular).  Chronic combined systolic and diastolic CHF/ICMwith recovery of EF Known Lymphedema 08/2019 echo above shows recovery of EF to 55 to 60%.   known lymphedema. No evidence ofacutedecompensation of HF.   AOCKDIII with improvement of Cr Continue spironolactone, losartan  Hypokalemia Potassium 3.5 yesterday, no labs today On spironolactone  HTN -Continue spironolactone, losartan  Congenital deafness --Video interpreter / sign language for communication.  Anemia with known AVM, melena Enteroscopy 08/2019 without active bleed.  Iron supplement  Complicated case as detailed above, Extensive review of recent history  Total encounter time more than 35 minutes  Greater than 50% was spent in counseling and coordination of care with the patient    For questions or updates,  please contact [place] Please consult [email or web address] for contact info under        Signed, Ida Rogue, MD  08/24/2019, 1:05 PM

## 2019-08-24 NOTE — Care Management Important Message (Signed)
Important Message  Patient Details  Name: Thomas [name] MRN: [id number] Date of Birth: 10-Mar-1935   Medicare Important Message Given:  Other (see comment)  Patient transferring to [place] for further inpatient care.    Dannette Barbara 08/24/2019, 12:11 PM

## 2019-08-24 NOTE — Progress Notes (Addendum)
Patient arrived from [place] at 1850hrs, oriented to unit using ASL interpreter Eustace Pen [phone]. Wife at bedside and given contact information.  PA Furth notified of patient arrival.

## 2019-08-24 NOTE — Progress Notes (Signed)
Report called to Vaughan Basta RN at [facility] on [place] room 8

## 2019-08-24 NOTE — Progress Notes (Signed)
Patient ID: TARQUIN WELCHER, male   DOB: 10/16/1934, 85 y.o.   MRN: [id number] [facility] PROGRESS NOTE  GATSBY CHISMAR KDX:[id number] DOB: [birth year] DOA: 08/18/2019 PCP: [name], MD  HPI/Subjective: Patient feeling okay.  Offers no complaints.  Breathing is fine.  Cough is less.  Objective: Vitals:   08/24/19 1000 08/24/19 1228  BP: 115/69 126/66  Pulse: 79 70  Resp: 20 20  Temp: 98.2 F (36.8 C) 97.8 F (36.6 C)  SpO2: 96% 100%    Intake/Output Summary (Last 24 hours) at 08/24/2019 1414 Last data filed at 08/24/2019 1300 Gross per 24 hour  Intake 0 ml  Output 750 ml  Net -750 ml   Filed Weights   08/18/19 1346 08/19/19 0224 08/23/19 0845  Weight: 89.5 kg 92.2 kg 92.2 kg    ROS: Review of Systems  Constitutional: Negative for fever.  Eyes: Negative for blurred vision.  Respiratory: Negative for cough and shortness of breath.   Cardiovascular: Negative for chest pain.  Gastrointestinal: Negative for abdominal pain, constipation, diarrhea, nausea and vomiting.  Genitourinary: Negative for dysuria.  Musculoskeletal: Negative for joint pain.  Neurological: Negative for dizziness and headaches.   Exam: Physical Exam  Constitutional: He is oriented to person, place, and time.  HENT:  Nose: No mucosal edema.  Mouth/Throat: No oropharyngeal exudate.  Eyes: Conjunctivae and lids are normal.  Neck: Carotid bruit is not present.  Cardiovascular: S1 normal and S2 normal. Exam reveals no gallop.  No murmur heard. Respiratory: No respiratory distress. He has decreased breath sounds in the right lower field and the left lower field. He has no wheezes. He has no rhonchi. He has no rales.  GI: Soft. Bowel sounds are normal. There is no abdominal tenderness.  Musculoskeletal:     Right ankle: Swelling present.     Left ankle: Swelling present.  Lymphadenopathy:    He has no cervical adenopathy.  Neurological: He is alert and oriented to person, place, and time. No  cranial nerve deficit.  Skin: Skin is warm. No rash noted. Nails show no clubbing.      Data Reviewed: Basic Metabolic Panel: Recent Labs  Lab 08/18/19 1353 08/18/19 2351 08/19/19 0227 08/19/19 0227 08/20/19 0446 08/20/19 2119 08/21/19 0504 08/22/19 0320 08/23/19 0448  NA   < >  --  139  --  139  --  141 141 145  K   < >  --  3.5   < > 2.9* 3.0* 3.3* 3.2* 3.5  CL   < >  --  103  --  102  --  108 107 111  CO2   < >  --  27  --  27  --  28 27 29   GLUCOSE   < >  --  139*  --  128*  --  103* 96 86  BUN   < >  --  67*  --  67*  --  48* 31* 20  CREATININE   < >  --  2.57*  --  1.95*  --  1.42* 1.07 0.88  CALCIUM   < >  --  7.4*  --  7.5*  --  7.8* 7.8* 7.6*  MG  --  2.0  --   --   --   --  2.2 2.3 2.3   < > = values in this interval not displayed.   Liver Function Tests: Recent Labs  Lab 08/18/19 1353  AST 46*  ALT 18  ALKPHOS 87  BILITOT 1.1  PROT 4.9*  ALBUMIN 1.9*   CBC: Recent Labs  Lab 08/18/19 1353 08/18/19 2029 08/19/19 1206 08/19/19 1206 08/19/19 2244 08/19/19 2244 08/20/19 0446 08/20/19 1322 08/20/19 2047 08/21/19 0504 08/21/19 1749 08/22/19 0320 08/23/19 0448  WBC 10.9*   < > 12.1*  --  9.4  --  9.2  --   --   --   --  7.1 6.3  NEUTROABS 9.4*  --   --   --   --   --   --   --   --   --   --  5.2 4.2  HGB 7.4*   < > 6.7*   < > 8.5*   < > 8.7*   < > 9.1* 8.8* 9.3* 8.8* 9.0*  HCT 24.4*   < > 22.2*  --  26.9*  --  26.5*  --   --   --   --  29.4* 28.8*  MCV 78.5*   < > 79.3*  --  78.4*  --  77.3*  --   --   --   --  80.8 79.3*  PLT 181   < > 144*  --  142*  --  131*  --   --   --   --  160 180   < > = values in this interval not displayed.  BNP (last 3 results) Recent Labs    06/16/19 2052 07/14/19 1352 08/18/19 1353  BNP 1,390.0* 298.0* 588.8*      Recent Results (from the past 240 hour(s))  Blood Culture (routine x 2)     Status: Abnormal   Collection Time: 08/18/19  2:34 PM   Specimen: BLOOD  Result Value Ref Range Status   Specimen  Description   Final    BLOOD BLOOD RIGHT HAND Performed at [facility], [address], [place], [place] [address]    Special Requests   Final    BOTTLES DRAWN AEROBIC AND ANAEROBIC Blood Culture adequate volume Performed at [facility], [address] [place], [place] [address]    Culture  Setup Time   Final    GRAM POSITIVE COCCI IN BOTH AEROBIC AND ANAEROBIC BOTTLES CRITICAL RESULT CALLED TO, READ BACK BY AND VERIFIED WITH: DAVID BESANTI 08/19/19 AT 0050 HS Performed at [facility], [place] [address], [place], [place] [address]    Culture GROUP B STREP(S.AGALACTIAE)ISOLATED (A)  Final   Report Status 08/21/2019 FINAL  Final   Organism ID, Bacteria GROUP B STREP(S.AGALACTIAE)ISOLATED  Final      Susceptibility   Group b strep(s.agalactiae)isolated - MIC*    CLINDAMYCIN >=1 RESISTANT Resistant     AMPICILLIN <=0.25 SENSITIVE Sensitive     ERYTHROMYCIN >=8 RESISTANT Resistant     VANCOMYCIN 0.5 SENSITIVE Sensitive     CEFTRIAXONE <=0.12 SENSITIVE Sensitive     LEVOFLOXACIN 1 SENSITIVE Sensitive     PENICILLIN Value in next row Sensitive      SENSITIVE0.06    * GROUP B STREP(S.AGALACTIAE)ISOLATED  Blood Culture ID Panel (Reflexed)     Status: Abnormal   Collection Time: 08/18/19  2:34 PM  Result Value Ref Range Status   Enterococcus species NOT DETECTED NOT DETECTED Final   Listeria monocytogenes NOT DETECTED NOT DETECTED Final   Staphylococcus species NOT DETECTED NOT DETECTED Final   Staphylococcus aureus (BCID) NOT DETECTED NOT DETECTED Final   Streptococcus species DETECTED (A) NOT DETECTED Final    Comment: CRITICAL RESULT CALLED TO, READ BACK  BY AND VERIFIED WITH: DAVID BESANTI 08/19/19 AT 0050 HS    Streptococcus agalactiae DETECTED (A) NOT DETECTED Final    Comment: CRITICAL RESULT CALLED TO, READ BACK BY AND VERIFIED WITH: DAVID BESANTI 08/19/19 AT 0050 HS    Streptococcus pneumoniae NOT DETECTED NOT DETECTED Final   Streptococcus pyogenes NOT  DETECTED NOT DETECTED Final   Acinetobacter baumannii NOT DETECTED NOT DETECTED Final   Enterobacteriaceae species NOT DETECTED NOT DETECTED Final   Enterobacter cloacae complex NOT DETECTED NOT DETECTED Final   Escherichia coli NOT DETECTED NOT DETECTED Final   Klebsiella oxytoca NOT DETECTED NOT DETECTED Final   Klebsiella pneumoniae NOT DETECTED NOT DETECTED Final   Proteus species NOT DETECTED NOT DETECTED Final   Serratia marcescens NOT DETECTED NOT DETECTED Final   Haemophilus influenzae NOT DETECTED NOT DETECTED Final   Neisseria meningitidis NOT DETECTED NOT DETECTED Final   Pseudomonas aeruginosa NOT DETECTED NOT DETECTED Final   Candida albicans NOT DETECTED NOT DETECTED Final   Candida glabrata NOT DETECTED NOT DETECTED Final   Candida krusei NOT DETECTED NOT DETECTED Final   Candida parapsilosis NOT DETECTED NOT DETECTED Final   Candida tropicalis NOT DETECTED NOT DETECTED Final    Comment: Performed at [facility], [address] [place], [place] [address]  Blood Culture (routine x 2)     Status: Abnormal   Collection Time: 08/18/19  2:44 PM   Specimen: BLOOD  Result Value Ref Range Status   Specimen Description   Final    BLOOD RIGHT ANTECUBITAL Performed at [facility], [address] [place], [place] [address]    Special Requests   Final    BOTTLES DRAWN AEROBIC AND ANAEROBIC Blood Culture adequate volume Performed at [facility], [address] [place], [place] [address]    Culture  Setup Time   Final    GRAM POSITIVE COCCI IN BOTH AEROBIC AND ANAEROBIC BOTTLES CRITICAL VALUE NOTED.  VALUE IS CONSISTENT WITH PREVIOUSLY REPORTED AND CALLED VALUE. Performed at [facility], [address] [place], [place] [address]    Culture (A)  Final    GROUP B STREP(S.AGALACTIAE)ISOLATED SUSCEPTIBILITIES PERFORMED ON PREVIOUS CULTURE WITHIN THE LAST 5 DAYS. Performed at [facility], [place] [address], [place], [place]  [address]    Report Status 08/21/2019 FINAL  Final  SARS Coronavirus 2 by RT PCR (hospital order, performed in [facility] hospital lab) Nasopharyngeal Nasopharyngeal Swab     Status: None   Collection Time: 08/18/19  2:50 PM   Specimen: Nasopharyngeal Swab  Result Value Ref Range Status   SARS Coronavirus 2 NEGATIVE NEGATIVE Final    Comment: (NOTE) SARS-CoV-2 target nucleic acids are NOT DETECTED. The SARS-CoV-2 RNA is generally detectable in upper and lower respiratory specimens during the acute phase of infection. The lowest concentration of SARS-CoV-2 viral copies this assay can detect is 250 copies / mL. A negative result does not preclude SARS-CoV-2 infection and should not be used as the sole basis for treatment or other patient management decisions.  A negative result may occur with improper specimen collection / handling, submission of specimen other than nasopharyngeal swab, presence of viral mutation(s) within the areas targeted by this assay, and inadequate number of viral copies (<250 copies / mL). A negative result must be combined with clinical observations, patient history, and epidemiological information. Fact Sheet for Patients:   StrictlyIdeas.no Fact Sheet for Healthcare Providers: BankingDealers.co.za This test is not yet approved or cleared  by the  [place] Architectural technologist and has been authorized for detection and/or diagnosis of SARS-CoV-2 by FDA under an Print production planner (EUA).  This EUA will remain in effect (meaning this test can be used) for the duration of the COVID-19 declaration under Section 564(b)(1) of the Act, 21 U.S.C. section 360bbb-3(b)(1), unless the authorization is terminated or revoked sooner. Performed at [facility], [address] [place], [place] [address]   Culture, sputum-assessment     Status: None   Collection Time: 08/19/19  1:11 AM   Specimen: Sputum  Result Value Ref  Range Status   Specimen Description SPUTUM  Final   Special Requests NONE  Final   Sputum evaluation   Final    Sputum specimen not acceptable for testing.  Please recollect.   C/CHARLIE FLEETWOOD AT 7322 08/19/19.PMF Performed at [facility], [address] [place], [place] [address]    Report Status 08/19/2019 FINAL  Final  MRSA PCR Screening     Status: None   Collection Time: 08/19/19  1:30 AM   Specimen: Nasal Mucosa; Nasopharyngeal  Result Value Ref Range Status   MRSA by PCR NEGATIVE NEGATIVE Final    Comment:        The GeneXpert MRSA Assay (FDA approved for NASAL specimens only), is one component of a comprehensive MRSA colonization surveillance program. It is not intended to diagnose MRSA infection nor to guide or monitor treatment for MRSA infections. Performed at [facility], [address] [place], [place] [address]   Gastrointestinal Panel by PCR , Stool     Status: None   Collection Time: 08/19/19 12:56 PM   Specimen: Stool  Result Value Ref Range Status   Campylobacter species NOT DETECTED NOT DETECTED Final   Plesimonas shigelloides NOT DETECTED NOT DETECTED Final   Salmonella species NOT DETECTED NOT DETECTED Final   Yersinia enterocolitica NOT DETECTED NOT DETECTED Final   Vibrio species NOT DETECTED NOT DETECTED Final   Vibrio cholerae NOT DETECTED NOT DETECTED Final   Enteroaggregative E coli (EAEC) NOT DETECTED NOT DETECTED Final   Enteropathogenic E coli (EPEC) NOT DETECTED NOT DETECTED Final   Enterotoxigenic E coli (ETEC) NOT DETECTED NOT DETECTED Final   Shiga like toxin producing E coli (STEC) NOT DETECTED NOT DETECTED Final   Shigella/Enteroinvasive E coli (EIEC) NOT DETECTED NOT DETECTED Final   Cryptosporidium NOT DETECTED NOT DETECTED Final   Cyclospora cayetanensis NOT DETECTED NOT DETECTED Final   Entamoeba histolytica NOT DETECTED NOT DETECTED Final   Giardia lamblia NOT DETECTED NOT DETECTED Final   Adenovirus F40/41  NOT DETECTED NOT DETECTED Final   Astrovirus NOT DETECTED NOT DETECTED Final   Norovirus GI/GII NOT DETECTED NOT DETECTED Final   Rotavirus A NOT DETECTED NOT DETECTED Final   Sapovirus (I, II, IV, and V) NOT DETECTED NOT DETECTED Final    Comment: Performed at [facility], [address] [place], [place] [address]  Urine culture     Status: None   Collection Time: 08/20/19  9:24 AM   Specimen: Urine  Result Value Ref Range Status   Specimen Description   Final    IN/OUT CATH URINE Performed at [facility], [address] [place], [place] [address]    Special Requests   Final    NONE Performed at [facility], [address], [place], [place] [address]    Culture   Final    NO GROWTH Performed at [facility], [address],  [place], [place] [address]    Report Status 08/21/2019 FINAL  Final  C Difficile Quick Screen w PCR reflex     Status: None   Collection Time: 08/20/19 12:01 PM   Specimen: STOOL  Result Value Ref Range Status   C Diff antigen NEGATIVE NEGATIVE Final   C Diff toxin NEGATIVE NEGATIVE Final   C Diff interpretation No C. difficile detected.  Final    Comment: Performed at [facility], [address] [place], [place] [address]  CULTURE, BLOOD (ROUTINE X 2) w Reflex to ID Panel     Status: None (Preliminary result)   Collection Time: 08/20/19  1:07 PM   Specimen: BLOOD  Result Value Ref Range Status   Specimen Description BLOOD [facility]  Final   Special Requests   Final    BOTTLES DRAWN AEROBIC AND ANAEROBIC Blood Culture adequate volume   Culture   Final    NO GROWTH 4 DAYS Performed at [facility], [address] [place], [place] [address]    Report Status PENDING  Incomplete  CULTURE, BLOOD (ROUTINE X 2) w Reflex to ID Panel     Status: None (Preliminary result)   Collection Time: 08/20/19  1:22 PM   Specimen: BLOOD  Result Value Ref Range Status   Specimen Description BLOOD [facility]  Final    Special Requests   Final    BOTTLES DRAWN AEROBIC AND ANAEROBIC Blood Culture adequate volume   Culture   Final    NO GROWTH 4 DAYS Performed at [facility], [address], [place], [place] [address]    Report Status PENDING  Incomplete     Studies: ECHO TEE  Result Date: 08/23/2019    TRANSESOPHOGEAL ECHO REPORT   Patient Name:   BRANDT CHANEY Date of Exam: 08/23/2019 Medical Rec #:  [id number]      Height:       74.0 in Accession #:    [id number]     Weight:       203.3 lb Date of Birth:  Jul 29, 1934     BSA:          2.188 m Patient Age:    75 years       BP:           108/62 mmHg Patient Gender: M              HR:           58 bpm. Exam Location:  [facility] Procedure: Transesophageal Echo, Cardiac Doppler and Color Doppler Indications:     Not listed on order  History:         Patient has prior history of Echocardiogram examinations, most                  recent 08/21/2019. CAD, Prior CABG and AICD; Risk                  Factors:Hypertension. PAF.  Sonographer:     Sherrie Sport RDCS (AE) Referring Phys:  [id number] Kate Sable Diagnosing Phys: Kate Sable MD PROCEDURE: The transesophogeal probe was passed without difficulty through the esophogus of the patient. Sedation performed by performing physician. The patient developed no complications during the procedure. IMPRESSIONS  1. Left ventricular ejection fraction, by estimation, is 55 to 60%. The left ventricle has normal function. The left ventricle has no regional wall motion abnormalities.  2. Right ventricular systolic function is normal. The right ventricular size is normal.  3.  Left atrial size was mild to moderately dilated. No left atrial/left atrial appendage thrombus was detected.  4. Right atrial size was mildly dilated.  5. The mitral valve is normal in structure. Mild mitral valve regurgitation.  6. Tricuspid valve regurgitation is moderate to severe.  7. The aortic valve is tricuspid. Aortic valve regurgitation is mild.  Conclusion(s)/Recommendation(s): No evidence of vegetation/infective endocarditis on this transesophageal echocardiogram. FINDINGS  Left Ventricle: Left ventricular ejection fraction, by estimation, is 55 to 60%. The left ventricle has normal function. The left ventricle has no regional wall motion abnormalities. The left ventricular internal cavity size was normal in size. There is  no left ventricular hypertrophy. Right Ventricle: The right ventricular size is normal. No increase in right ventricular wall thickness. Right ventricular systolic function is normal. Left Atrium: Left atrial size was mild to moderately dilated. No left atrial/left atrial appendage thrombus was detected. Right Atrium: Right atrial size was mildly dilated. Pericardium: There is no evidence of pericardial effusion. Mitral Valve: The mitral valve is normal in structure. Mild mitral valve regurgitation. Tricuspid Valve: The tricuspid valve is grossly normal. Tricuspid valve regurgitation is moderate to severe. Aortic Valve: The aortic valve is tricuspid. Aortic valve regurgitation is mild. Pulmonic Valve: The pulmonic valve was not well visualized. Pulmonic valve regurgitation is trivial. Aorta: The aortic root is normal in size and structure. Venous: The inferior vena cava was not well visualized. IAS/Shunts: No atrial level shunt detected by color flow Doppler. Additional Comments: A pacer wire is visualized. Kate Sable MD Electronically signed by Kate Sable MD Signature Date/Time: 08/23/2019/4:10:21 PM    Final     Scheduled Meds: . ferrous sulfate  325 mg Oral Daily  . ipratropium-albuterol  3 mL Nebulization Q6H  . losartan  12.5 mg Oral Daily  . multivitamin with minerals  1 tablet Oral Daily  . pantoprazole (PROTONIX) IV  40 mg Intravenous Q12H  . simvastatin  20 mg Oral Daily  . spironolactone  25 mg Oral Daily   Continuous Infusions: . sodium chloride 250 mL (08/23/19 0343)  . cefTRIAXone (ROCEPHIN)  IV 2  g (08/24/19 0234)    Assessment/Plan:   1. Septic shock secondary to group B streptococcus sepsis and pneumonia.  Present on admission TEE negative for vegetation.  The patient had a prior episode of group B streptococcus sepsis back in April and required 4 weeks of IV antibiotics at that time.  This time, infectious disease doctor recommended removing the AICD.  Patient on Rocephin. Repeat blood cultures negative.  Infectious disease recommends 4 weeks of IV Rocephin after AICD removal..  She recommends repeating a blood cultures after AICD removal and waiting 2 days prior to placing PICC line.  Patient can follow-up with Dr. [name] for as outpatient. 2. Acute blood loss anemia with iron deficiency.  The patient received IV iron during the hospital course.  Patient on IV Protonix here.  Blood thinners held.  Patient had a small bowel capsule endoscopy which did not show any source of bleeding.  Evaluated by GI.  Last hemoglobin 9.0. 3. Chronic systolic congestive heart failure.  No signs of congestive heart failure currently.  Can restart Cozaar and torsemide if blood pressure allows 4. Hypokalemia on Aldactone 5. Essential hypertension.  Already on Aldactone 6. History of nonhemorrhagic stroke with permanent atrial fibrillation.  Not a candidate for anticoagulation at this time 7. Acute kidney injury.  This has improved. 8. Stage II decubitus present on admission.  See description below  Pressure  Injury 06/21/19 Buttocks Mid Stage 2 -  Partial thickness loss of dermis presenting as a shallow open injury with a red, pink wound bed without slough. (Active)  06/21/19 0700  Location: Buttocks  Location Orientation: Mid  Staging: Stage 2 -  Partial thickness loss of dermis presenting as a shallow open injury with a red, pink wound bed without slough.  Wound Description (Comments):   Present on Admission:        Code Status:     Code Status Orders  (From admission, onward)          Start     Ordered   08/19/19 0111  Full code  Continuous     08/19/19 0110        Code Status History    Date Active Date Inactive Code Status Order ID Comments User Context   06/17/2019 0007 06/25/2019 1925 Full Code [id number]  Gwynne Edinger, MD ED   05/19/2019 1918 05/30/2019 2312 Full Code [id number]  Vashti Hey, MD ED   09/14/2018 0603 09/16/2018 1641 Full Code [id number]  Harrie Foreman, MD Inpatient   08/12/2018 0344 08/17/2018 1736 Full Code [id number]  Etta Quill, DO ED   05/25/2018 2042 05/28/2018 1654 Full Code [id number]  Vianne Bulls, MD ED   07/15/2016 0929 07/15/2016 1636 Full Code [id number]  Bettey Costa, MD Inpatient   10/31/2015 1444 11/01/2015 1921 Full Code [id number]  Nicholes Mango, MD Inpatient   09/25/2013 1137 09/26/2013 1502 Full Code [id number]  Sherren Mocha, MD Inpatient   09/21/2013 1444 09/25/2013 1137 Full Code [id number]  Sherren Mocha, MD Inpatient   12/16/2012 1648 12/17/2012 1639 Full Code [id number]  [name], MD Inpatient   Advance Care Planning Activity     Family Communication: Spoke with son on the phone yesterday about plan for transfer Disposition Plan: Status is: Inpatient   Dispo: The patient is from: Home              Anticipated d/c is to: [facility]              Anticipated d/c date is: Today  to [facility] when bed available              Patient currently requires IV antibiotics.  Patient will be transferred to [facility] to have AICD removed.  Consultants:  Cardiology  Gastroenterology  Antibiotics:  Rocephin  Time spent: 32 minutes  [place]

## 2019-08-24 NOTE — H&P (Addendum)
History and Physical    Thomas [name]:[id number] DOB: 26-Jul-1934 DOA: 08/24/2019  PCP: [name], MD  Patient coming from: Transfer from [facility]   Chief Complaint: light headedness   HPI:    85 year old deaf male with past medical history of coronary artery disease (S/P CABG 1986, STEMI in 2000, last cath 09/2013 with DES to SVG-OM), high grade AV block S/P Pacemaker switched to Medtronic dual chamber biventricular ICD , chronic systolic congestive heart failure (EF as low as 30%, now found to be 55-60% during TEE on 08/23/2019), stage IV chronic atrial fibrillation (not on anticoagulation due to GIB), 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  Febrile and found to have sepsis with left lower lobe pneumonia with recurrent group B strep bacteremia.  During the [facility] hospitalization, patient already does found to have multiple SIRS criteria and organ dysfunction (AKI) and soft blood pressures concerning for sepsis requiring intravenous antibiotic therapy.  Chest x-ray revealed evidence of possible developing pneumonia at the left base.  Blood cultures grew out recurrent group B strep.  Patient was followed by both Dr. [name] with Infectious disease as well as Dr. [name] with Cardiology.  TEE was performed on 6/9 according to the patient revealed no evidence of vegetations.  Despite this finding, infectious disease recommended transfer to [facility] for removal of ICD considering recurrent group B strep bacteremia.   During patient's hospital course, patient also was found to be anemic with concerns for melena and a  hemoglobin of 6.9.  Status post 2 units packed red blood cell transfusion.  Patient was initiated on intravenous proton pump inhibitor.  Dr. [name] with gastroenterology was consulted who performed small bowel enteroscopy revealing no evidence of significant pathology in the duodenum, jejunum, esophagus or stomach.  Hemoglobin has remained stable status post transfusion  Patient has now been transferred to [facility] for ICD removal.  Case was discussed with Dr. [name] with EP at time of acceptance.  Upon my evaluation of the patient upon arrival to the medical floor, patient continues to complain of continued cough although this is improved compared to his initial presentation to [facility] on 6/4.  Patient is also complaining of shortness of breath whenever lying flat and progressively worsening bilateral lower extremity edema beyond his baseline.  Patient denies chest pain or lightheadedness.  Review of Systems: A 10-system review of systems has been performed and all systems are negative with the exception of what is listed in the HPI.    Past Medical History:  Diagnosis Date  . AICD (automatic cardioverter/defibrillator) present   . Bacteremia due to group B Streptococcus    Recurrent admissions for group B Strepotococcus bacteremia of unknown source with TEEs negative for vegetation 06/2019 and 08/2019  . Biventricular ICD (implantable cardioverter-defibrillator) in place 03/24/2005   Implantation of a Medtronic Adapta ADDRO1, serial number [id number]  . CHF (congestive heart failure) ([place])   . CKD (chronic kidney disease), stage III   . Coronary artery disease    a. s/p CABG 1986. b. Multiple PCIs/caths. c. 09/2013: s/p PTCA and BMS to SVG-OM.  [name]    Requires sign language interpreter  .  Dysrhythmia   . History of abdominal aortic aneurysm   . History of bleeding peptic ulcer 1980  . History of epididymitis 2013  . HTN (hypertension)   . Hydronephrosis with ureteropelvic junction  obstruction   . Hydroureter on left 2009  . Hypertension   . Ischemic cardiomyopathy    a. Prior EF 30-35%, s/p BIV-ICD. b. 09/2013: EF 45-50%.  . Moderate tricuspid regurgitation   . PAF (paroxysmal atrial fibrillation) (HCC)    Not on [place] 2/2 GIB  . Presence of permanent cardiac pacemaker 2002   Original placed in 2002 for CHB then 2007 and 2014 device change out  . Prostate cancer ([place])   . Status post coronary artery bypass grafting 1986   LIMA to the LAD, SVG to OM, SVG to RCA  . Testicular swelling     Past Surgical History:  Procedure Laterality Date  . 2-D echocardiogram  11/20/2011   Ejection fraction 30-35% moderate concentric left ventricular hypertrophy. Left atrium is moderately dilated. Mild MR. Mild or  . BI-VENTRICULAR IMPLANTABLE CARDIOVERTER DEFIBRILLATOR N/A 12/16/2012   Procedure: BI-VENTRICULAR IMPLANTABLE CARDIOVERTER DEFIBRILLATOR  (CRT-D);  Surgeon: [name], MD;  Location: [facility] CATH LAB;  Service: Cardiovascular;  Laterality: N/A;  . CARDIAC CATHETERIZATION  12/10/2011   SVG to OM widely patent.  LIMA to LAD patent  . CATARACT EXTRACTION W/PHACO Right 10/12/2017   Procedure: CATARACT EXTRACTION PHACO AND INTRAOCULAR LENS PLACEMENT (IOC);  Surgeon: Birder Robson, MD;  Location: [facility] ORS;  Service: Ophthalmology;  Laterality: Right;  [place] 00:57 AP% 15.9 CDE 9.07 Fluid pack lot # [id number]  . COLONOSCOPY N/A 07/13/2018   Procedure: COLONOSCOPY;  Surgeon: Toledo, Benay Pike, MD;  Location: [facility] ENDOSCOPY;  Service: Gastroenterology;  Laterality: N/A;  . CORONARY ARTERY BYPASS GRAFT  1986  . ENTEROSCOPY N/A 09/14/2018   Procedure: ENTEROSCOPY;  Surgeon: Toledo, Benay Pike, MD;  Location: [facility] ENDOSCOPY;  Service: Gastroenterology;  Laterality: N/A;  symptomatic anemia, GI blood loss anemia, melena, positive small bowel capsule endoscopy showing source of bleeding   . ENTEROSCOPY N/A 08/22/2019   Procedure: ENTEROSCOPY;  Surgeon: Lin Landsman, MD;  Location:  [facility] ENDOSCOPY;  Service: Gastroenterology;  Laterality: N/A;  . ESOPHAGOGASTRODUODENOSCOPY N/A 07/13/2018   Procedure: ESOPHAGOGASTRODUODENOSCOPY (EGD);  Surgeon: Toledo, Benay Pike, MD;  Location: [facility] ENDOSCOPY;  Service: Gastroenterology;  Laterality: N/A;  . ESOPHAGOGASTRODUODENOSCOPY N/A 09/14/2018   Procedure: ESOPHAGOGASTRODUODENOSCOPY (EGD);  Surgeon: Toledo, Benay Pike, MD;  Location: [facility] ENDOSCOPY;  Service: Gastroenterology;  Laterality: N/A;  SIGN LANAGUAGE INTERPRETER  . ESOPHAGOGASTRODUODENOSCOPY N/A 06/21/2019   Procedure: ESOPHAGOGASTRODUODENOSCOPY (EGD);  Surgeon: Toledo, Benay Pike, MD;  Location: [facility] ENDOSCOPY;  Service: Gastroenterology;  Laterality: N/A;  . ESOPHAGOGASTRODUODENOSCOPY (EGD) WITH PROPOFOL N/A 05/27/2018   Procedure: ESOPHAGOGASTRODUODENOSCOPY (EGD) WITH PROPOFOL;  Surgeon: Clarene Essex, MD;  Location: [place];  Service: Endoscopy;  Laterality: N/A;  . INSERT / REPLACE / REMOVE PACEMAKER    . LEFT HEART CATHETERIZATION WITH CORONARY/GRAFT ANGIOGRAM N/A 12/10/2011   Procedure: LEFT HEART CATHETERIZATION WITH Beatrix Fetters;  Surgeon: Sanda Klein, MD;  Location: [place] CATH LAB;  Service: Cardiovascular;  Laterality: N/A;  . LEFT HEART CATHETERIZATION WITH CORONARY/GRAFT ANGIOGRAM N/A 09/25/2013   Procedure: LEFT HEART CATHETERIZATION WITH Beatrix Fetters;  Surgeon: Blane Ohara, MD;  Location: [facility] C[facility] LAB;  Service: Cardiovascular;  Laterality: N/A;  . [place] Myoview  05/06/2010   Post-rest ejection fraction 30%. No significant ischemia demonstrated. Compared to previous study there is no significant change.  . TEE WITHOUT CARDIOVERSION N/A 06/22/2019  Procedure: TRANSESOPHAGEAL ECHOCARDIOGRAM (TEE);  Surgeon: Minna Merritts, MD;  Location: [facility] ORS;  Service: Cardiovascular;  Laterality: N/A;  . TRANSURETHRAL RESECTION OF PROSTATE     s/p     reports that he quit smoking about 34 years ago. He has never used smokeless tobacco. He  reports that he does not drink alcohol and does not use drugs.  Allergies  Allergen Reactions  . Entresto [Sacubitril-Valsartan] Swelling    And bruising of arm  . Phenazopyridine Nausea Only and Other (See Comments)    GI UPSET  . Ramipril Other (See Comments)    unk Other reaction(s): Other (See Comments), Unknown unk    Family History  Problem Relation Age of Onset  . Hypertension Father      Prior to Admission medications   Medication Sig Start Date End Date Taking? Authorizing Provider  acetaminophen (TYLENOL) 325 MG tablet Take 2 tablets (650 mg total) by mouth every 6 (six) hours as needed for mild pain or fever. 08/23/19   Loletha Grayer, MD  albuterol (PROVENTIL) (2.5 MG/3ML) 0.083% nebulizer solution Inhale 3 mLs into the lungs every 4 (four) hours as needed for wheezing or shortness of breath. 08/23/19   Wieting, Richard, MD  cefTRIAXone 2 g in sodium chloride 0.9 % 100 mL Inject 2 g into the vein daily. 08/24/19   Loletha Grayer, MD  dextromethorphan-guaiFENesin [facility] DM) 30-600 MG 12hr tablet Take 1 tablet by mouth 2 (two) times daily as needed for cough. 08/23/19   Loletha Grayer, MD  ferrous sulfate 325 (65 FE) MG tablet Take 1 tablet (325 mg total) by mouth daily for 30 days. 05/28/18   Elodia Florence., MD  ipratropium-albuterol (DUONEB) 0.5-2.5 (3) MG/3ML SOLN Take 3 mLs by nebulization every 6 (six) hours. 08/23/19   Loletha Grayer, MD  losartan (COZAAR) 25 MG tablet Take 0.5 tablets (12.5 mg total) by mouth daily. 06/26/19   Danford, Suann Larry, MD  Multiple Vitamin (MULTIVITAMIN WITH MINERALS) TABS tablet Take 1 tablet by mouth daily. 05/29/19   Nicole Kindred A, DO  ondansetron (ZOFRAN) 4 MG/2ML SOLN injection Inject 2 mLs (4 mg total) into the vein every 8 (eight) hours as needed for nausea or vomiting. 08/23/19   Loletha Grayer, MD  pantoprazole (PROTONIX) 40 MG injection Inject 40 mg into the vein every 12 (twelve) hours. 08/23/19   Loletha Grayer, MD    simvastatin (ZOCOR) 20 MG tablet TAKE 1 TABLET BY MOUTH EVERY DAY Patient taking differently: Take 20 mg by mouth daily.  04/07/19   Erlene Quan, PA-C  spironolactone (ALDACTONE) 25 MG tablet Take 1 tablet (25 mg total) by mouth daily. 08/01/19 10/30/19  Alisa Graff, FNP  torsemide (DEMADEX) 20 MG tablet Take 2 tablets (40 mg total) by mouth daily. 08/23/19   Loletha Grayer, MD    Physical Exam: Vitals:   08/24/19 1853 08/24/19 1958 08/24/19 2035  BP: 115/60    Pulse: 64 60   Resp: 18 17   Temp: 98.8 F (37.1 C) 97.8 F (36.6 C)   TempSrc: Oral Axillary   SpO2: 99% 100% 99%  Weight: 96 kg    Height: 6\' 2"  (1.88 m)      Constitutional: Acute alert and oriented x3, no associated distress.   Skin: Significant hyperemic rash noted of the groin and buttocks.  No sacral wounds observed on examination.  Good skin turgor noted. Eyes: Pupils are equally reactive to light.  No evidence of scleral icterus or conjunctival pallor.  ENMT: Moist mucous membranes noted.  Posterior pharynx clear of any exudate or lesions.   Neck: normal, supple, no masses, no thyromegaly.  Slight elevation of the jugular venous pulse at 45 degrees. Respiratory: Significant bibasilar rales with intermittent mild expiratory wheezing.  Normal respiratory effort. No accessory muscle use.  Cardiovascular: Regular rate and rhythm, 3/6 systolic murmur noted.  Extensive bilateral lower extremity pitting edema that tracks up through the thighs with additional left upper extremity edema.  2+ pedal pulses. No carotid bruits.  Chest:   Nontender without crepitus or deformity.   Back:   Nontender without crepitus or deformity. Abdomen: Abdomen is soft and nontender.  No evidence of intra-abdominal masses.  Positive bowel sounds noted in all quadrants.   Musculoskeletal: No joint deformity upper and lower extremities. Good ROM, no contractures. Normal muscle tone.  Neurologic: Patient is deaf.  Cranial nerves are otherwise  intact.  Sensation intact, strength noted to be 5 out of 5 in all 4 extremities.  Patient is following all commands.  Patient is responsive to verbal stimuli.   Psychiatric: Patient presents as a normal mood with appropriate affect.  Patient seems to possess insight as to theircurrent situation.     Labs on Admission: I have personally reviewed following labs and imaging studies -   CBC: Recent Labs  Lab 08/18/19 1353 08/18/19 2029 08/19/19 2244 08/19/19 2244 08/20/19 0446 08/20/19 1322 08/21/19 0504 08/21/19 1749 08/22/19 0320 08/23/19 0448 08/24/19 2100  WBC 10.9*   < > 9.4  --  9.2  --   --   --  7.1 6.3 6.2  NEUTROABS 9.4*  --   --   --   --   --   --   --  5.2 4.2 4.4  HGB 7.4*   < > 8.5*   < > 8.7*   < > 8.8* 9.3* 8.8* 9.0* 8.6*  HCT 24.4*   < > 26.9*  --  26.5*  --   --   --  29.4* 28.8* 29.2*  MCV 78.5*   < > 78.4*  --  77.3*  --   --   --  80.8 79.3* 83.4  PLT 181   < > 142*  --  131*  --   --   --  160 180 222   < > = values in this interval not displayed.   Basic Metabolic Panel: Recent Labs  Lab 08/18/19 1353 08/18/19 2351 08/19/19 0227 08/19/19 0227 08/20/19 0446 08/20/19 2119 08/21/19 0504 08/22/19 0320 08/23/19 0448  NA   < >  --  139  --  139  --  141 141 145  K   < >  --  3.5   < > 2.9* 3.0* 3.3* 3.2* 3.5  CL   < >  --  103  --  102  --  108 107 111  CO2   < >  --  27  --  27  --  28 27 29   GLUCOSE   < >  --  139*  --  128*  --  103* 96 86  BUN   < >  --  67*  --  67*  --  48* 31* 20  CREATININE   < >  --  2.57*  --  1.95*  --  1.42* 1.07 0.88  CALCIUM   < >  --  7.4*  --  7.5*  --  7.8* 7.8* 7.6*  MG  --  2.0  --   --   --   --  2.2 2.3 2.3   < > = values in this interval not displayed.   GFR: Estimated Creatinine Clearance: 75.2 mL/min (by C-G formula based on SCr of 0.88 mg/dL). Liver Function Tests: Recent Labs  Lab 08/18/19 1353  AST 46*  ALT 18  ALKPHOS 87  BILITOT 1.1  PROT 4.9*  ALBUMIN 1.9*   No results for input(s): LIPASE,  AMYLASE in the last 168 hours. No results for input(s): AMMONIA in the last 168 hours. Coagulation Profile: Recent Labs  Lab 08/18/19 2029  INR 1.6*   Cardiac Enzymes: No results for input(s): CKTOTAL, CKMB, CKMBINDEX, TROPONINI in the last 168 hours. BNP (last 3 results) No results for input(s): PROBNP in the last 8760 hours. HbA1C: No results for input(s): HGBA1C in the last 72 hours. CBG: No results for input(s): GLUCAP in the last 168 hours. Lipid Profile: No results for input(s): CHOL, HDL, LDLCALC, TRIG, CHOLHDL, LDLDIRECT in the last 72 hours. Thyroid Function Tests: No results for input(s): TSH, T4TOTAL, FREET4, T3FREE, THYROIDAB in the last 72 hours. Anemia Panel: No results for input(s): VITAMINB12, FOLATE, FERRITIN, TIBC, IRON, RETICCTPCT in the last 72 hours. Urine analysis:    Component Value Date/Time   COLORURINE YELLOW (A) 08/20/2019 0924   APPEARANCEUR HAZY (A) 08/20/2019 0924   APPEARANCEUR Clear 12/13/2017 0918   LABSPEC 1.015 08/20/2019 0924   LABSPEC 1.021 11/19/2011 1501   PHURINE 5.0 08/20/2019 0924   GLUCOSEU NEGATIVE 08/20/2019 0924   GLUCOSEU Negative 11/19/2011 1501   HGBUR NEGATIVE 08/20/2019 0924   BILIRUBINUR NEGATIVE 08/20/2019 0924   BILIRUBINUR Negative 12/13/2017 0918   BILIRUBINUR Negative 11/19/2011 1501   KETONESUR NEGATIVE 08/20/2019 0924   PROTEINUR NEGATIVE 08/20/2019 0924   NITRITE NEGATIVE 08/20/2019 0924   LEUKOCYTESUR NEGATIVE 08/20/2019 0924   LEUKOCYTESUR Negative 11/19/2011 1501    Radiological Exams on Admission - Personally Reviewed: ECHO TEE  Result Date: 08/23/2019    TRANSESOPHOGEAL ECHO REPORT   Patient Name:   Thomas [name] Date of Exam: 08/23/2019 Medical Rec #:  [id number]      Height:       74.0 in Accession #:    [id number]     Weight:       203.3 lb Date of Birth:  [birth year]     BSA:          2.188 m Patient Age:    33 years       BP:           108/62 mmHg Patient Gender: M              HR:           58 bpm. Exam  Location:  [facility] Procedure: Transesophageal Echo, Cardiac Doppler and Color Doppler Indications:     Not listed on order  History:         Patient has prior history of Echocardiogram examinations, most                  recent 08/21/2019. CAD, Prior CABG and AICD; Risk                  Factors:Hypertension. PAF.  Sonographer:     Sherrie Sport RDCS (AE) Referring Phys:  [id number] Kate Sable Diagnosing Phys: Kate Sable MD PROCEDURE: The transesophogeal probe was passed without difficulty through the esophogus of the patient. Sedation performed by performing physician. The patient developed no complications during the procedure. IMPRESSIONS  1. Left ventricular ejection fraction, by estimation, is  55 to 60%. The left ventricle has normal function. The left ventricle has no regional wall motion abnormalities.  2. Right ventricular systolic function is normal. The right ventricular size is normal.  3. Left atrial size was mild to moderately dilated. No left atrial/left atrial appendage thrombus was detected.  4. Right atrial size was mildly dilated.  5. The mitral valve is normal in structure. Mild mitral valve regurgitation.  6. Tricuspid valve regurgitation is moderate to severe.  7. The aortic valve is tricuspid. Aortic valve regurgitation is mild. Conclusion(s)/Recommendation(s): No evidence of vegetation/infective endocarditis on this transesophageal echocardiogram. FINDINGS  Left Ventricle: Left ventricular ejection fraction, by estimation, is 55 to 60%. The left ventricle has normal function. The left ventricle has no regional wall motion abnormalities. The left ventricular internal cavity size was normal in size. There is  no left ventricular hypertrophy. Right Ventricle: The right ventricular size is normal. No increase in right ventricular wall thickness. Right ventricular systolic function is normal. Left Atrium: Left atrial size was mild to moderately dilated. No left atrial/left atrial appendage  thrombus was detected. Right Atrium: Right atrial size was mildly dilated. Pericardium: There is no evidence of pericardial effusion. Mitral Valve: The mitral valve is normal in structure. Mild mitral valve regurgitation. Tricuspid Valve: The tricuspid valve is grossly normal. Tricuspid valve regurgitation is moderate to severe. Aortic Valve: The aortic valve is tricuspid. Aortic valve regurgitation is mild. Pulmonic Valve: The pulmonic valve was not well visualized. Pulmonic valve regurgitation is trivial. Aorta: The aortic root is normal in size and structure. Venous: The inferior vena cava was not well visualized. IAS/Shunts: No atrial level shunt detected by color flow Doppler. Additional Comments: A pacer wire is visualized. Kate Sable MD Electronically signed by Kate Sable MD Signature Date/Time: 08/23/2019/4:10:21 PM    Final     Telemetry: Personally reviewed at the bedside.  Rhythm is paced rhythm with a heart rate of 64 bpm.   Assessment/Plan Principal Problem:   Bacteremia due to group B Streptococcus   Continuing ceftriaxone 2 g every 24 hours per infectious disease recommendations  Will ask infectious disease here to continue to follow the patient in the morning for continued recommendations.  Infectious disease at [facility] recommended approximate 4-week course of antibiotic therapy after removal of ICD.  Patient is currently going to be made n.p.o. for potential removal of ICD in the morning, per my review of the notes, case was discussed with Dr. [name] with the EP upon acceptance of the patient and should be notified in the morning.  Continue to follow last set of blood cultures obtained on 6/6 which currently revealed no growth.  Blood culture on 6/4 grew out group B streptococcus (strep agalactiae).  Obtaining repeat chest x-ray due to continued cough and substantial rales on examination  Active Problems: Acute on chronic systolic congestive heart failure   Patient  has longstanding known history of ischemic cardiomyopathy with ejection fractions as low as 30% in the past with most recent ejection fraction of 55 to 60% during TEE on 6/9  Patient has known history of lymphedema  That being said, patient is exhibiting extensive bilateral lower extremity edema, well beyond his baseline and well beyond documented cardiology notes even from earlier today.  Patient additionally is complaining of shortness of breath whenever lying flat in bed and continued cough with continued bilateral bibasilar rales on examination.    Chest x-ray revealing progressive, now bilateral infiltrates likely consistent with acute cardiogenic pulmonary edema.  Will attempt a one-time trial dose of 40 mg of IV Lasix which can be repeated by daytime provider based on clinical response.   Left lower lobe secondary to group B strep   Respiratory consultation ordered  Spirometry ordered  As needed bronchodilator therapy ordered  Continuing intravenous antibiotic therapy as above, please see remainder of assessment and plan above    Iron deficiency anemia due to chronic blood loss   Hemoglobin and hematocrit currently stable  No reported evidence of melena today  Small bowel enteroscopy performed 6/8 revealed no evidence of active bleeding  Continuing Protonix 40 mg IV every 12 which can be transitioned to oral PPI at time of discharge.  Patient is status post 2 units packed blood cell transfusion on 6/4.    Sepsis due to group B Streptococcus ([place])   Please see assessment and plan above.    Permanent atrial fibrillation (HCC)  Currently exhibiting a paced rhythm  Monitoring on telemetry  No coagulation due to history of gastrointestinal bleeding including during this hospitalization    Coronary artery disease of bypass graft of native heart with stable angina pectoris (HCC)   Currently chest pain-free  Monitoring patient on telemetry  Continue home  regimen of statin therapy    Acute renal failure superimposed on stage 3a chronic kidney disease ([place])   With intravenous volume resuscitation and treatment of underlying infection  Monitoring renal function and electrolytes with serial chemistry    Candidal intertrigo   Extensive evidence of candidal intertrigo of the groin and buttocks  Placing patient on compounded topical therapy of hydrocortisone, nystatin and zinc oxide twice daily.    Mixed hyperlipidemia   Continue home regimen of statin therapy  Left upper extremity edema   Left upper extremity [facility] in April negative for DVT  Left upper extremity edema during this hospitalization is quite extensive, I will repeat the ultrasound   Code Status:  Full code Family Communication: Patient's wife has been updated on plan of care by the floor staff  Status is: Inpatient  Remains inpatient appropriate because:IV treatments appropriate due to intensity of illness or inability to take PO and Inpatient level of care appropriate due to severity of illness   Dispo: The patient is from: Home              Anticipated d/c is to: Home              Anticipated d/c date is: > 3 days              Patient currently is not medically stable to d/c.        Vernelle Emerald MD [facility] Pager [phone]  If 7PM-7AM, please contact night-coverage [email or web address] Use universal [place] password for that web site. If you do not have the password, please call the hospital operator.  08/24/2019, 9:24 PM

## 2019-08-24 NOTE — TOC Progression Note (Addendum)
Transition of Care [facility]) - Progression Note    Patient Details  Name: Thomas [name] MRN: [id number] Date of Birth: 1934-11-05  Transition of Care [facility]) CM/SW [place], LCSW Phone Number: 08/24/2019, 9:33 AM  Clinical Narrative:  Notified [facility] representative of plan for 4 weeks IV abx. Left message for [facility] representative to notify.   11:33 am: At this time [name] is unable to provide home health services if patient is going to receive IV abx. They said since he is transferring to [facility], the [facility] staff can follow up with them closer to discharge and see what staffing looks like then.  Expected Discharge Plan: [place] Barriers to Discharge: Continued Medical Work up  Expected Discharge Plan and Services Expected Discharge Plan: [place] arrangements for the past 2 months: Single Family Home Expected Discharge Date: 08/23/19                           [facility] Agency: [facility] Date [place]: 08/21/19   Representative spoke with at [place]: [place] ([place]) Interventions    Readmission Risk Interventions Readmission Risk Prevention Plan 08/21/2019 06/23/2019 06/20/2019  Transportation Screening Complete Complete Complete  PCP or Specialist Appt within 3-5 Days - Complete -  HRI or [place] - Complete Complete  Social Work Consult for [place] Planning/Counseling - Complete -  Palliative Care Screening - Not Applicable Not Applicable  Medication Review Press photographer) Complete Complete Complete  PCP or Specialist appointment within 3-5 days of discharge Complete - -  [place] or Home Care Consult Complete - -  [place] Complete - -  Some recent data might be hidden

## 2019-08-24 NOTE — Progress Notes (Signed)
[facility] is here to transport the patient. VSS stable. Valuables sent with the patient. Attempted to call wife. Message left on her answering machine by sign language interpreter

## 2019-08-25 ENCOUNTER — Inpatient Hospital Stay (HOSPITAL_COMMUNITY): Payer: PPO

## 2019-08-25 ENCOUNTER — Inpatient Hospital Stay (HOSPITAL_COMMUNITY): Admission: RE | Admit: 2019-08-25 | Payer: PPO | Source: Home / Self Care | Admitting: Internal Medicine

## 2019-08-25 ENCOUNTER — Inpatient Hospital Stay (HOSPITAL_COMMUNITY): Payer: PPO | Admitting: Certified Registered"

## 2019-08-25 ENCOUNTER — Encounter: Payer: Self-pay | Admitting: Cardiology

## 2019-08-25 ENCOUNTER — Inpatient Hospital Stay: Payer: Self-pay

## 2019-08-25 ENCOUNTER — Encounter (HOSPITAL_COMMUNITY)
Admission: AD | Disposition: A | Discharge status: Skilled Nursing Facility | Payer: Self-pay | Source: Other Acute Inpatient Hospital | Attending: Internal Medicine

## 2019-08-25 DIAGNOSIS — K31819 Angiodysplasia of stomach and duodenum without bleeding: Secondary | ICD-10-CM

## 2019-08-25 DIAGNOSIS — T827XXA Infection and inflammatory reaction due to other cardiac and vascular devices, implants and grafts, initial encounter: Principal | ICD-10-CM

## 2019-08-25 DIAGNOSIS — R7881 Bacteremia: Secondary | ICD-10-CM

## 2019-08-25 DIAGNOSIS — B951 Streptococcus, group B, as the cause of diseases classified elsewhere: Secondary | ICD-10-CM

## 2019-08-25 HISTORY — PX: ICD LEAD REMOVAL: SHX5855

## 2019-08-25 HISTORY — PX: TEE WITHOUT CARDIOVERSION: SHX5443

## 2019-08-25 LAB — CBC WITH DIFFERENTIAL/PLATELET
Abs Immature Granulocytes: 0.06 10*3/uL (ref 0.00–0.07)
Basophils Absolute: 0 10*3/uL (ref 0.0–0.1)
Basophils Relative: 0 %
Eosinophils Absolute: 0.2 10*3/uL (ref 0.0–0.5)
Eosinophils Relative: 3 %
HCT: 29.3 % — ABNORMAL LOW (ref 39.0–52.0)
Hemoglobin: 8.7 g/dL — ABNORMAL LOW (ref 13.0–17.0)
Immature Granulocytes: 1 %
Lymphocytes Relative: 15 %
Lymphs Abs: 0.8 10*3/uL (ref 0.7–4.0)
MCH: 24.6 pg — ABNORMAL LOW (ref 26.0–34.0)
MCHC: 29.7 g/dL — ABNORMAL LOW (ref 30.0–36.0)
MCV: 83 fL (ref 80.0–100.0)
Monocytes Absolute: 0.6 10*3/uL (ref 0.1–1.0)
Monocytes Relative: 12 %
Neutro Abs: 3.5 10*3/uL (ref 1.7–7.7)
Neutrophils Relative %: 69 %
Platelets: 203 10*3/uL (ref 150–400)
RBC: 3.53 MIL/uL — ABNORMAL LOW (ref 4.22–5.81)
RDW: 19.9 % — ABNORMAL HIGH (ref 11.5–15.5)
WBC: 5.2 10*3/uL (ref 4.0–10.5)
nRBC: 0 % (ref 0.0–0.2)

## 2019-08-25 LAB — BASIC METABOLIC PANEL
Anion gap: 7 (ref 5–15)
BUN: 11 mg/dL (ref 8–23)
CO2: 27 mmol/L (ref 22–32)
Calcium: 7.5 mg/dL — ABNORMAL LOW (ref 8.9–10.3)
Chloride: 108 mmol/L (ref 98–111)
Creatinine, Ser: 0.99 mg/dL (ref 0.61–1.24)
GFR calc Af Amer: >60 mL/min (ref >60)
GFR calc non Af Amer: >60 mL/min (ref >60)
Glucose, Bld: 99 mg/dL (ref 70–99)
Potassium: 3.6 mmol/L (ref 3.5–5.1)
Sodium: 142 mmol/L (ref 135–145)

## 2019-08-25 LAB — CULTURE, BLOOD (ROUTINE X 2)
Culture: NO GROWTH
Culture: NO GROWTH
Special Requests: ADEQUATE
Special Requests: ADEQUATE

## 2019-08-25 LAB — SURGICAL PCR SCREEN
MRSA, PCR: NEGATIVE
Staphylococcus aureus: NEGATIVE

## 2019-08-25 LAB — ECHO INTRAOPERATIVE TEE
Height: 74 in
Weight: 3389.79 oz

## 2019-08-25 LAB — PREPARE RBC (CROSSMATCH)

## 2019-08-25 LAB — GLUCOSE, CAPILLARY: Glucose-Capillary: 93 mg/dL (ref 70–99)

## 2019-08-25 SURGERY — REMOVAL, ELECTRODE LEAD, ICD
Anesthesia: General | Site: Chest

## 2019-08-25 MED ORDER — DEXAMETHASONE SODIUM PHOSPHATE 10 MG/ML IJ SOLN
INTRAMUSCULAR | Status: AC
  Filled 2019-08-25: qty 1

## 2019-08-25 MED ORDER — CHLORHEXIDINE GLUCONATE 4 % EX LIQD
60.0000 mL | Freq: Once | CUTANEOUS | Status: AC
  Administered 2019-08-25: 4 via TOPICAL

## 2019-08-25 MED ORDER — LACTATED RINGERS IV SOLN: INTRAVENOUS | Status: DC

## 2019-08-25 MED ORDER — CHLORHEXIDINE GLUCONATE 0.12 % MT SOLN: 15.0000 mL | Freq: Once | OROMUCOSAL | Status: AC

## 2019-08-25 MED ORDER — SODIUM CHLORIDE 0.9 % IV SOLN
INTRAVENOUS | Status: AC
  Filled 2019-08-25: qty 1.2

## 2019-08-25 MED ORDER — SODIUM CHLORIDE 0.9% IV SOLUTION: Freq: Once | INTRAVENOUS | Status: DC

## 2019-08-25 MED ORDER — SODIUM CHLORIDE 0.9 % IV SOLN
INTRAVENOUS | Status: DC | PRN
  Administered 2019-08-25: 500 mL

## 2019-08-25 MED ORDER — PROPOFOL 10 MG/ML IV BOLUS
INTRAVENOUS | Status: DC | PRN
  Administered 2019-08-25: 100 mg via INTRAVENOUS

## 2019-08-25 MED ORDER — DEXAMETHASONE SODIUM PHOSPHATE 10 MG/ML IJ SOLN
INTRAMUSCULAR | Status: DC | PRN
  Administered 2019-08-25: 5 mg via INTRAVENOUS

## 2019-08-25 MED ORDER — FENTANYL CITRATE (PF) 100 MCG/2ML IJ SOLN
INTRAMUSCULAR | Status: DC | PRN
  Administered 2019-08-25: 50 ug via INTRAVENOUS

## 2019-08-25 MED ORDER — ROCURONIUM BROMIDE 10 MG/ML (PF) SYRINGE
PREFILLED_SYRINGE | INTRAVENOUS | Status: AC
  Filled 2019-08-25: qty 10

## 2019-08-25 MED ORDER — SODIUM CHLORIDE 0.9 % IV SOLN
80.0000 mg | INTRAVENOUS | Status: DC
  Filled 2019-08-25: qty 2

## 2019-08-25 MED ORDER — CHLORHEXIDINE GLUCONATE CLOTH 2 % EX PADS
6.0000 | MEDICATED_PAD | Freq: Every day | CUTANEOUS | Status: DC
  Administered 2019-08-26 – 2019-09-12 (×13): 6 via TOPICAL

## 2019-08-25 MED ORDER — CHLORHEXIDINE GLUCONATE 4 % EX LIQD
60.0000 mL | Freq: Once | CUTANEOUS | Status: AC
  Filled 2019-08-25: qty 60

## 2019-08-25 MED ORDER — PHENYLEPHRINE HCL-NACL 10-0.9 MG/250ML-% IV SOLN
INTRAVENOUS | Status: DC | PRN
  Administered 2019-08-25: 25 ug/min via INTRAVENOUS

## 2019-08-25 MED ORDER — SUGAMMADEX SODIUM 200 MG/2ML IV SOLN
INTRAVENOUS | Status: DC | PRN
  Administered 2019-08-25: 200 mg via INTRAVENOUS

## 2019-08-25 MED ORDER — CEFAZOLIN SODIUM-DEXTROSE 1-4 GM/50ML-% IV SOLN
1.0000 g | Freq: Four times a day (QID) | INTRAVENOUS | Status: AC
  Administered 2019-08-25 – 2019-08-26 (×3): 1 g via INTRAVENOUS
  Filled 2019-08-25 (×4): qty 50

## 2019-08-25 MED ORDER — SODIUM CHLORIDE 0.9 % IV SOLN
INTRAVENOUS | Status: AC
  Filled 2019-08-25: qty 2

## 2019-08-25 MED ORDER — ONDANSETRON HCL 4 MG/2ML IJ SOLN
INTRAMUSCULAR | Status: AC
  Filled 2019-08-25: qty 2

## 2019-08-25 MED ORDER — ACETAMINOPHEN 325 MG PO TABS
325.0000 mg | ORAL_TABLET | ORAL | Status: DC | PRN
  Administered 2019-08-28 – 2019-08-31 (×2): 650 mg via ORAL
  Filled 2019-08-25: qty 2

## 2019-08-25 MED ORDER — LIDOCAINE 2% (20 MG/ML) 5 ML SYRINGE
INTRAMUSCULAR | Status: DC | PRN
  Administered 2019-08-25: 60 mg via INTRAVENOUS

## 2019-08-25 MED ORDER — ORAL CARE MOUTH RINSE: 15.0000 mL | Freq: Once | OROMUCOSAL | Status: AC

## 2019-08-25 MED ORDER — ROCURONIUM BROMIDE 10 MG/ML (PF) SYRINGE
PREFILLED_SYRINGE | INTRAVENOUS | Status: DC | PRN
  Administered 2019-08-25: 30 mg via INTRAVENOUS
  Administered 2019-08-25: 20 mg via INTRAVENOUS
  Administered 2019-08-25: 50 mg via INTRAVENOUS

## 2019-08-25 MED ORDER — SODIUM CHLORIDE 0.9 % IV SOLN: INTRAVENOUS | Status: DC

## 2019-08-25 MED ORDER — FENTANYL CITRATE (PF) 250 MCG/5ML IJ SOLN
INTRAMUSCULAR | Status: AC
  Filled 2019-08-25: qty 5

## 2019-08-25 MED ORDER — ALBUMIN HUMAN 5 % IV SOLN
INTRAVENOUS | Status: DC | PRN
Start: 2019-08-25 — End: 2019-08-25

## 2019-08-25 MED ORDER — ONDANSETRON HCL 4 MG/2ML IJ SOLN
INTRAMUSCULAR | Status: DC | PRN
  Administered 2019-08-25: 4 mg via INTRAVENOUS

## 2019-08-25 MED ORDER — LIDOCAINE 2% (20 MG/ML) 5 ML SYRINGE
INTRAMUSCULAR | Status: AC
  Filled 2019-08-25: qty 5

## 2019-08-25 MED ORDER — PHENYLEPHRINE 40 MCG/ML (10ML) SYRINGE FOR IV PUSH (FOR BLOOD PRESSURE SUPPORT)
PREFILLED_SYRINGE | INTRAVENOUS | Status: AC
  Filled 2019-08-25: qty 10

## 2019-08-25 MED ORDER — PHENYLEPHRINE 40 MCG/ML (10ML) SYRINGE FOR IV PUSH (FOR BLOOD PRESSURE SUPPORT)
PREFILLED_SYRINGE | INTRAVENOUS | Status: DC | PRN
  Administered 2019-08-25: 80 ug via INTRAVENOUS

## 2019-08-25 MED ORDER — CEFAZOLIN SODIUM-DEXTROSE 2-4 GM/100ML-% IV SOLN
2.0000 g | INTRAVENOUS | Status: AC
  Administered 2019-08-25: 2 g via INTRAVENOUS
  Filled 2019-08-25 (×2): qty 100

## 2019-08-25 MED ORDER — TORSEMIDE 20 MG PO TABS: 40.0000 mg | ORAL_TABLET | Freq: Every day | ORAL | Status: DC

## 2019-08-25 MED ORDER — FUROSEMIDE 10 MG/ML IJ SOLN
40.0000 mg | Freq: Once | INTRAMUSCULAR | Status: AC
  Administered 2019-08-25: 40 mg via INTRAVENOUS
  Filled 2019-08-25: qty 4

## 2019-08-25 MED ORDER — ONDANSETRON HCL 4 MG/2ML IJ SOLN
4.0000 mg | Freq: Four times a day (QID) | INTRAMUSCULAR | Status: DC | PRN
  Administered 2019-08-30: 4 mg via INTRAVENOUS

## 2019-08-25 MED ORDER — CHLORHEXIDINE GLUCONATE 0.12 % MT SOLN
OROMUCOSAL | Status: AC
  Administered 2019-08-25: 15 mL via OROMUCOSAL
  Filled 2019-08-25: qty 15

## 2019-08-25 MED ORDER — PHENYLEPHRINE HCL (PRESSORS) 10 MG/ML IV SOLN
INTRAVENOUS | Status: AC
  Filled 2019-08-25: qty 1

## 2019-08-25 MED ORDER — PROPOFOL 10 MG/ML IV BOLUS
INTRAVENOUS | Status: AC
  Filled 2019-08-25: qty 20

## 2019-08-25 SURGICAL SUPPLY — 72 items
BAG BANDED W/RUBBER/TAPE 36X54 (MISCELLANEOUS) IMPLANT
BAG DECANTER FOR FLEXI CONT (MISCELLANEOUS) ×8 IMPLANT
BLADE CLIPPER SURG (BLADE) IMPLANT
BLADE OSCILLATING /SAGITTAL (BLADE) IMPLANT
BLADE STERNUM SYSTEM 6 (BLADE) IMPLANT
BNDG COHESIVE 4X5 WHT NS (GAUZE/BANDAGES/DRESSINGS) ×4 IMPLANT
BRUSH SCRUB EZ PLAIN DRY (MISCELLANEOUS) ×16 IMPLANT
CABLE ADAPT PACING TEMP 12FT (ADAPTER) ×4 IMPLANT
CABLE SURGICAL S-101-97-12 (CABLE) ×8 IMPLANT
CANISTER SUCT 3000ML PPV (MISCELLANEOUS) ×4 IMPLANT
CATH INFINITI 6F MPB2 (CATHETERS) ×4 IMPLANT
CATH S G BIP PACING (CATHETERS) ×4 IMPLANT
COIL ONE TIE COMPRESSION (VASCULAR PRODUCTS) ×8 IMPLANT
COVER BACK TABLE 60X90IN (DRAPES) ×4 IMPLANT
COVER SURGICAL LIGHT HANDLE (MISCELLANEOUS) ×4 IMPLANT
COVER WAND RF STERILE (DRAPES) IMPLANT
DEVICE LCKNG LEAD CARDIAC (CATHETERS) ×4 IMPLANT
DRAPE C-ARM 42X72 X-RAY (DRAPES) ×4 IMPLANT
DRAPE CARDIOVASCULAR INCISE (DRAPES) ×4
DRAPE HALF SHEET 40X57 (DRAPES) ×12 IMPLANT
DRAPE INCISE IOBAN 66X45 STRL (DRAPES) ×8 IMPLANT
DRAPE SRG 135X102X78XABS (DRAPES) ×2 IMPLANT
DRSG TEGADERM 4X4.75 (GAUZE/BANDAGES/DRESSINGS) ×4 IMPLANT
ELECT REM PT RETURN 9FT ADLT (ELECTROSURGICAL) ×8
ELECTRODE REM PT RTRN 9FT ADLT (ELECTROSURGICAL) ×4 IMPLANT
FELT TEFLON 1X6 (MISCELLANEOUS) IMPLANT
GAUZE 4X4 16PLY RFD (DISPOSABLE) ×16 IMPLANT
GAUZE SPONGE 4X4 12PLY STRL (GAUZE/BANDAGES/DRESSINGS) ×4 IMPLANT
GAUZE SPONGE 4X4 16PLY XRAY LF (GAUZE/BANDAGES/DRESSINGS) ×4 IMPLANT
GLOVE BIO SURGEON STRL SZ 6.5 (GLOVE) ×3 IMPLANT
GLOVE BIO SURGEONS STRL SZ 6.5 (GLOVE) ×1
GLOVE BIOGEL PI IND STRL 6.5 (GLOVE) ×6 IMPLANT
GLOVE BIOGEL PI IND STRL 7.5 (GLOVE) ×2 IMPLANT
GLOVE BIOGEL PI INDICATOR 6.5 (GLOVE) ×6
GLOVE BIOGEL PI INDICATOR 7.5 (GLOVE) ×2
GLOVE ECLIPSE 6.5 STRL STRAW (GLOVE) ×4 IMPLANT
GLOVE ECLIPSE 8.0 STRL XLNG CF (GLOVE) ×8 IMPLANT
GLOVE INDICATOR 7.5 STRL GRN (GLOVE) ×4 IMPLANT
GOWN STRL REUS W/ TWL LRG LVL3 (GOWN DISPOSABLE) ×2 IMPLANT
GOWN STRL REUS W/ TWL XL LVL3 (GOWN DISPOSABLE) ×6 IMPLANT
GOWN STRL REUS W/TWL LRG LVL3 (GOWN DISPOSABLE) ×4
GOWN STRL REUS W/TWL XL LVL3 (GOWN DISPOSABLE) ×12
KIT TURNOVER KIT B (KITS) ×4 IMPLANT
NEEDLE PERC 18GX7CM (NEEDLE) ×4 IMPLANT
NS IRRIG 1000ML POUR BTL (IV SOLUTION) IMPLANT
PAD ARMBOARD 7.5X6 YLW CONV (MISCELLANEOUS) ×8 IMPLANT
PAD ELECT DEFIB RADIOL ZOLL (MISCELLANEOUS) ×4 IMPLANT
REMOVAL LLD CARDIAC LEAD EZ (CATHETERS) ×8
SHEATH 11 SUB-C ROTATE DILATOR (SHEATH) ×4 IMPLANT
SHEATH 7FR PRELUDE SNAP 13 (SHEATH) ×4 IMPLANT
SHEATH EVOLUTION RL 13F (SHEATH) ×4 IMPLANT
SHEATH PINNACLE 6F 10CM (SHEATH) ×4 IMPLANT
SHEATH TIGHTRAIL SUB-C (SHEATH) ×4 IMPLANT
SLEEVE REPOSITIONING LENGTH 30 (MISCELLANEOUS) ×8 IMPLANT
STYLET LIBERATOR LOCKING (MISCELLANEOUS) ×8 IMPLANT
SUT PROLENE 2 0 CT2 30 (SUTURE) IMPLANT
SUT PROLENE 2 0 SH DA (SUTURE) ×4 IMPLANT
SUT SILK  1 MH (SUTURE) ×4
SUT SILK 1 MH (SUTURE) ×2 IMPLANT
SUT SILK 2 0 SH (SUTURE) ×8 IMPLANT
SUT VIC AB 2-0 CT2 18 VCP726D (SUTURE) IMPLANT
SUT VIC AB 3-0 X1 27 (SUTURE) IMPLANT
TAPE CLOTH SURG 4X10 WHT LF (GAUZE/BANDAGES/DRESSINGS) ×4 IMPLANT
TOWEL GREEN STERILE (TOWEL DISPOSABLE) ×8 IMPLANT
TOWEL GREEN STERILE FF (TOWEL DISPOSABLE) ×16 IMPLANT
TRAY FOLEY MTR SLVR 16FR STAT (SET/KITS/TRAYS/PACK) IMPLANT
TUBE CONNECTING 12'X1/4 (SUCTIONS) ×1
TUBE CONNECTING 12X1/4 (SUCTIONS) ×3 IMPLANT
TUBING EXTENTION W/L.L. (IV SETS) ×4 IMPLANT
WIRE HI TORQ VERSACORE J 260CM (WIRE) ×4 IMPLANT
WIRE MAILMAN 182CM (WIRE) ×4 IMPLANT
YANKAUER SUCT BULB TIP NO VENT (SUCTIONS) ×4 IMPLANT

## 2019-08-25 NOTE — Progress Notes (Signed)
      [address]       [place] [address]             [phone]      I provided surgical backup for 2 hours today  Remo Lipps C. Roxan Hockey, MD [facility] [phone]

## 2019-08-25 NOTE — OR Nursing (Signed)
Both leads from patient's pacemaker are out at 1715 by Dr. Cristopher Peru, MD and Dr. Roxan Hockey, MD on standby.

## 2019-08-25 NOTE — Anesthesia Procedure Notes (Signed)
Arterial Line Insertion Start/End6/01/2020 2:00 PM, 08/25/2019 2:10 PM Performed by: Shirlyn Goltz, CRNA, CRNA  Patient location: Pre-op. Preanesthetic checklist: patient identified, IV checked, site marked, risks and benefits discussed, surgical consent, monitors and equipment checked and pre-op evaluation Lidocaine 1% used for infiltration Right, radial was placed Catheter size: 20 G Hand hygiene performed , maximum sterile barriers used  and Seldinger technique used Allen's test indicative of satisfactory collateral circulation Attempts: 1 Procedure performed without using ultrasound guided technique. Following insertion, Biopatch and dressing applied. Post procedure assessment: normal

## 2019-08-25 NOTE — CV Procedure (Signed)
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  Anesthesia service was utilized to provide general endotracheal anesthesia and invasive dynamic monitoring with a right radial artery art line.  After the usual preparation and draping and timeouts were obtained, a 6 French sheath was inserted percutaneously into the right femoral vein.  Attention was then turned to the ICD system.  A 6 cm incision was carried out over the old ICD pocket and a combination of blunt and sharp dissection and electrocautery utilized to dissect down to the fascia.  The ICD was removed with gentle traction.  The old right ventricular pacing lead was utilized for backup pacing as the patient had no escape.  Attention was first turned towards the defibrillator lead.  It was freed up of its dense fibrous scar tissue.  A stylette was inserted into the body of the lead and the helix retracted.  The stylette was removed and the lead was cut.  A liberator locking stylette was inserted into the body of the lead.  The liberator locking stylette was locked in place.  A Cook 1 tie suture was placed over the proximal portion of the lead.  Next a [employer] 8 [place] sub-C extraction sheath was advanced over the defibrillator lead.  There was a fair amount of resistance at the junction of the innominate vein superior vena cava.  Traction was placed on the lead.  The Cook RL long sheath was advanced over the lead and gentle traction again placed resulting in complete  removal of the defibrillator lead with no hemodynamic sequelae.  Attention was then turned to the left ventricular lead.  The 014 angioplasty guidewire was advanced into the body of the lead.  The angioplasty guidewire was removed and a [employer]ranetics lz locking stylette was inserted.  The [employer] locking stylette was locked in place.  The 11 French sub-C Spectranetics sheath was then advanced over the left ventricular pacing lead and a combination of pressure and counterpressure, traction and countertraction were placed on the body of the lead resulting in complete removal of the lead with no immediate complications.  Attention was then turned to removal of the right atrial lead which had been placed 20 years previously.  This was a Guidant active-fixation lead.  The stylette was withdrawn.  The liberator locking stylette was inserted into the body of the lead after the lead was cut and advanced to the tip.  The liberator stylette was locked in place.  A Cook 1 tie suture was placed on the back of the lead.  The 11 French Spectranetics sub-C sheath was advanced over the lead.  It was then removed and the [facility] [place] RL extraction sheath was advanced over the atrial lead down to the SVC and the atrial lead was removed in total.  There is no hemodynamic sequelae.  At this point, attention was turned to insertion of a temp perm pacemaker.  Utilizing an angioplasty guidewire, the left jugular vein was punctured and a Medtronic 5076 58 cm active-fixation pacing lead, serial number [id number] was advanced into the right ventricle with modest difficulty.  The right atrium was quite large and there appeared to be significant tricuspid regurgitation which was confirmed with intraoperative TEE.  The pacing lead was advanced across the tricuspid valve into the right ventricular septum and actively fixed.  Paced R waves were 8 mV.  The threshold was less than 2 V at 0.5 ms.  With the satisfactory parameters, it was  deemed most appropriate to place a second temporary pacing wire and this was done through the right femoral vein.  After confirmation of the second temporary pacing wire was in fact working satisfactorily, attention was then turned to removal of the last pacing lead.  Again this lead to be in place for 20 years.  This was a Guidant passive fixation pacing lead.  The locking stylette was advanced into the lead and the lead was locked in place.  A Cook 1 tie suture was placed on the proximal portion of the lead.  The [employer] sub-C 10 [place] extraction sheath was then advanced over the lead down to the junction of the innominate vein and the superior vena cava.  The sub-C sheath was removed and the [facility] RL 22 [place] extraction sheath was then advanced over the pacing lead into the right atrium and down to the right ventricle.  The right ventricular lead appeared to be embedded in the right ventricular septum.  There is very dense and extensive scar tissue in this location.  Finally we were able to advance the extraction sheath to the tip of the right ventricular lead.  Initially the lead remained fixed to the heart muscle.  After approximately 30 seconds of gentle traction the lead came loose and was removed with no hemodynamic sequelae.  At this point the lead and the extraction sheath was removed and hemostasis was assured.  Electrocautery was utilized.  The pocket was irrigated with antibiotic irrigation.  The skin was closed with multiple 2-0 Prolene suture.  Bandages were placed over the ICD pocket as well as the temp perm access site in the left internal jugular vein as well as in the right femoral vein and the patient was returned to the recovery area.  Complications: There were no immediate procedure complications except that tricuspid regurgitation appeared to be more severe after all 4 leads had been removed.  Conclusion: Successful extraction of two 83 year old atrial and ventricular pacing leads  along with removal of a left ventricular pacing lead and a active-fixation defibrillation lead in a patient with streptococcal bacteremia, recurrent.  The study also demonstrated successful insertion of a temporary permanent transvenous pacing lead as the patient had no underlying escape rhythm.  Cristopher Peru, MD

## 2019-08-25 NOTE — Transfer of Care (Signed)
Immediate Anesthesia Transfer of Care Note  Patient: Thomas [name]  Procedure(s) Performed: ICD LEAD EXTRACTION (Left Chest) TRANSESOPHAGEAL ECHOCARDIOGRAM (TEE) (N/A )  Patient Location: ICU  Anesthesia Type:General  Level of Consciousness: awake  Airway & Oxygen Therapy: Patient Spontanous Breathing and Patient connected to face mask oxygen  Post-op Assessment: Report given to RN and Post -op Vital signs reviewed and stable  Post vital signs: Reviewed and stable  Last Vitals:  Vitals Value Taken Time  BP    Temp    Pulse    Resp    SpO2      Last Pain:  Vitals:   08/25/19 1210  TempSrc: Oral  PainSc:       Patients Stated Pain Goal: 0 (16/96/78 9381)  Complications: No complications documented.

## 2019-08-25 NOTE — Progress Notes (Signed)
Echocardiogram Echocardiogram Transesophageal has been performed.  Thomas [name] Thomas [name] 08/25/2019, 3:30 PM

## 2019-08-25 NOTE — Anesthesia Preprocedure Evaluation (Addendum)
Anesthesia Evaluation  Patient identified by MRN, date of birth, ID band Patient awake    Reviewed: Allergy & Precautions, NPO status , Patient's Chart, lab work & pertinent test results  History of Anesthesia Complications Negative for: history of anesthetic complications  Airway Mallampati: II  TM Distance: >3 FB Neck ROM: Full    Dental  (+) Poor Dentition, Chipped, Dental Advisory Given   Pulmonary former smoker,    Pulmonary exam normal        Cardiovascular hypertension, Pt. on medications + CAD, + Past MI, + CABG and +CHF  Normal cardiovascular exam+ dysrhythmias Atrial Fibrillation + pacemaker + Cardiac Defibrillator + Valvular Problems/Murmurs    '21 TEE - EF 55 to 60%. LA was mild to moderately dilated. RA was mildly dilated. Mild MR and AI. Tricuspid valve regurgitation is moderate to severe.     Neuro/Psych  Deaf  CVA negative psych ROS   GI/Hepatic Neg liver ROS, PUD, GERD  Medicated and Controlled,  Endo/Other  negative endocrine ROS  Renal/GU negative Renal ROS     Musculoskeletal negative musculoskeletal ROS (+)   Abdominal   Peds  Hematology  (+) anemia ,   Anesthesia Other Findings Covid test negative 6/4    Reproductive/Obstetrics                            Anesthesia Physical Anesthesia Plan  ASA: III  Anesthesia Plan: General   Post-op Pain Management:    Induction: Intravenous  PONV Risk Score and Plan: 2 and Treatment may vary due to age or medical condition, Ondansetron and Dexamethasone  Airway Management Planned: Oral ETT  Additional Equipment: Arterial line and TEE  Intra-op Plan:   Post-operative Plan: Extubation in OR  Informed Consent: I have reviewed the patients History and Physical, chart, labs and discussed the procedure including the risks, benefits and alternatives for the proposed anesthesia with the patient or authorized  representative who has indicated his/her understanding and acceptance.     Dental advisory given  Plan Discussed with: CRNA and Anesthesiologist  Anesthesia Plan Comments: (TEE for monitoring purposes only. History and consent obtained with assistance from sign language interpreter.)       Anesthesia Quick Evaluation

## 2019-08-25 NOTE — Consult Note (Signed)
[facility]    Date of Admission:  08/24/2019     Total days of antibiotics 8               Reason for Consult: Group B Strep Bacteremia / Endocarditis Referring Provider: Burt Knack Primary Care Provider: [name], MD   ASSESSMENT:  Thomas [name] is an 85 y/o male with re-current Group B streptococcus bacteremia and concern for GI bleed. There is concern for source of infection being his ICD which he is having removed today. Bacteremia has cleared and TEEs have been without evidence of endocarditis or cardiac implantable device infection, however there is no clear source for his recurrent bacteremia. Will continue with ceftriaxone to minimize fluids in the setting of heart failure. Will need PICC line placement prior to discharge with plan for 4 weeks of ceftriaxone and follow up with Dr. [name].   PLAN:  1. Continue ceftriaxone. 2. PICC line placement prior to discharge. 3. OPAT / Home Health orders placed.  4. Will arrange follow up with Dr. [name].   Diagnosis: Group B streptococcus bacteremia  Culture Result: Group B streptococcus  Allergies  Allergen Reactions  . Entresto [Sacubitril-Valsartan] Swelling    And bruising of arm  . Phenazopyridine Nausea Only and Other (See Comments)    GI UPSET  . Ramipril Other (See Comments)    unk Other reaction(s): Other (See Comments), Unknown unk    OPAT Orders Discharge antibiotics to be given via PICC line Discharge antibiotics: Ceftriaxone Per pharmacy protocol  Aim for Vancomycin trough 15-20 or AUC 400-550 (unless otherwise indicated) Duration: 4 weeks  End Date: 09/22/19  [facility] Care Per Protocol:  Home health RN for IV administration and teaching; PICC line care and labs.    Labs weekly while on IV antibiotics: _X_ CBC with differential _X_ BMP __ CMP __ CRP __ ESR __ Vancomycin trough __ CK  _X_ Please pull PIC at completion of IV antibiotics __ Please leave PIC in  place until doctor has seen patient or been notified  Fax weekly labs to [phone]  Clinic Follow Up Appt:  To be made with Dr. [name].     Principal Problem:   Bacteremia due to group B Streptococcus Active Problems:   Cardiomyopathy, ischemic   Permanent atrial fibrillation (HCC)   Benign essential HTN   Mixed hyperlipidemia   Iron deficiency anemia due to chronic blood loss   Coronary artery disease of bypass graft of native heart with stable angina pectoris (HCC)   Acute renal failure superimposed on stage 3a chronic kidney disease (HCC)   Sepsis due to group B Streptococcus (HCC)   Duodenal arteriovenous malformation   Candidal intertrigo   Edema of left upper extremity   Pneumonia of left lower lobe due to group B Streptococcus ([place])   . chlorhexidine  60 mL Topical Once  . chlorhexidine  60 mL Topical Once  . ferrous sulfate  325 mg Oral Daily  . gentamicin irrigation  80 mg Irrigation On Call  . Gerhardt's butt cream   Topical BID  . multivitamin with minerals  1 tablet Oral Daily  . pantoprazole  40 mg Intravenous Q12H  . simvastatin  20 mg Oral q1800  . sodium chloride flush  3 mL Intravenous Q12H  . torsemide  40 mg Oral Daily     HPI: Thomas [name] is a 85 y.o. male with previous medical history of coronary artery disease s/p CABG in 1986 and  biventricular implantable cardioverter defibrillator, paroxysmal atrial fibrillation, prostate cancer s/p TURP, and Group B streptococcus s/p 4 weeks of IV antibiotics 2021 admitted with fever, mild cough and having black stools.   Lab work with WBC of 10.9, lactate 2.3, hemoglobin 7.4, and platelet count of 181. Blood cultures sent and started on azithromycin and ceftriaxone. Cultures then positive for Group B streptococcus and narrowed to ceftriaxone. Course complicated by development of hypotension requiring vasopressors. TEE without evidence of endocarditis. Of note this was re-current bacteremia as he was  previously treated in April 2021. Work up for GI bleed showed no source for Group B Streptococcus infection. Transferred to [facility] for removal of ICD.   Thomas [name] has been afebrile since initial admission day and has no leukocytosis. Blood cultures from 08/20/19 are without growth to date. Current antibiotic is ceftriaxone. Scheduled for ICD lead extraction today with Dr. [name].  Thomas [name] is hearing impaired and a sign language interpreter was used to aid in communication through [employer].   Review of Systems: Review of Systems  Constitutional: Negative for chills, fever and weight loss.  Respiratory: Negative for cough, shortness of breath and wheezing.   Cardiovascular: Negative for chest pain and leg swelling.  Gastrointestinal: Negative for abdominal pain, constipation, diarrhea, nausea and vomiting.  Skin: Negative for rash.     Past Medical History:  Diagnosis Date  . AICD (automatic cardioverter/defibrillator) present   . Bacteremia due to group B Streptococcus    Recurrent admissions for group B Strepotococcus bacteremia of unknown source with TEEs negative for vegetation 06/2019 and 08/2019  . Biventricular ICD (implantable cardioverter-defibrillator) in place 03/24/2005   Implantation of a [employer]onic Adapta ADDRO1, serial number [id number]  . CHF (congestive heart failure) ([place])   . CKD (chronic kidney disease), stage III   . Coronary artery disease    a. s/p CABG 1986. b. Multiple PCIs/caths. c. 09/2013: s/p PTCA and BMS to SVG-OM.  [name]    Requires sign language interpreter  . Dysrhythmia   . History of abdominal aortic aneurysm   . History of bleeding peptic ulcer 1980  . History of epididymitis 2013  . HTN (hypertension)   . Hydronephrosis with ureteropelvic junction obstruction   . Hydroureter on left 2009  . Hypertension   . Ischemic cardiomyopathy    a. Prior EF 30-35%, s/p BIV-ICD. b. 09/2013: EF 45-50%.  . Moderate tricuspid regurgitation   . PAF  (paroxysmal atrial fibrillation) (HCC)    Not on [place] 2/2 GIB  . Presence of permanent cardiac pacemaker 2002   Original placed in 2002 for CHB then 2007 and 2014 device change out  . Prostate cancer ([place])   . Status post coronary artery bypass grafting 1986   LIMA to the LAD, SVG to OM, SVG to RCA  . Testicular swelling     Social History   Tobacco Use  . Smoking status: Former Smoker    Quit date: 03/15/1985    Years since quitting: 34.4  . Smokeless tobacco: Never Used  Vaping Use  . Vaping Use: Never used  Substance Use Topics  . Alcohol use: No    Comment: occas.  . Drug use: No    Family History  Problem Relation Age of Onset  . Hypertension Father     Allergies  Allergen Reactions  . Entresto [Sacubitril-Valsartan] Swelling    And bruising of arm  . Phenazopyridine Nausea Only and Other (See Comments)    GI UPSET  . Ramipril Other (  See Comments)    unk Other reaction(s): Other (See Comments), Unknown unk    OBJECTIVE: Blood pressure (!) 105/54, pulse 61, temperature 98 F (36.7 C), temperature source Oral, resp. rate 18, height 6' 2" (1.88 m), weight 96.1 kg, SpO2 95 %.  Physical Exam Constitutional:      General: He is not in acute distress.    Appearance: He is well-developed.  Cardiovascular:     Rate and Rhythm: Normal rate and regular rhythm.     Heart sounds: Normal heart sounds.  Pulmonary:     Effort: Pulmonary effort is normal.     Breath sounds: Normal breath sounds.  Skin:    General: Skin is warm and dry.  Neurological:     Mental Status: He is alert and oriented to person, place, and time.  Psychiatric:        Behavior: Behavior normal.        Thought Content: Thought content normal.        Judgment: Judgment normal.     Lab Results Lab Results  Component Value Date   WBC 6.2 08/24/2019   HGB 8.6 (L) 08/24/2019   HCT 29.2 (L) 08/24/2019   MCV 83.4 08/24/2019   PLT 222 08/24/2019    Lab Results  Component Value Date    CREATININE 1.06 08/24/2019   BUN 12 08/24/2019   NA 141 08/24/2019   K 3.5 08/24/2019   CL 108 08/24/2019   CO2 28 08/24/2019    Lab Results  Component Value Date   ALT 18 08/18/2019   AST 46 (H) 08/18/2019   ALKPHOS 87 08/18/2019   BILITOT 1.1 08/18/2019     Microbiology: Recent Results (from the past 240 hour(s))  Blood Culture (routine x 2)     Status: Abnormal   Collection Time: 08/18/19  2:34 PM   Specimen: BLOOD  Result Value Ref Range Status   Specimen Description   Final    BLOOD BLOOD RIGHT HAND Performed at [facility], [address], [place], [place] [address]    Special Requests   Final    BOTTLES DRAWN AEROBIC AND ANAEROBIC Blood Culture adequate volume Performed at [facility], [address] [place], [place] [address]    Culture  Setup Time   Final    GRAM POSITIVE COCCI IN BOTH AEROBIC AND ANAEROBIC BOTTLES CRITICAL RESULT CALLED TO, READ BACK BY AND VERIFIED WITH: DAVID BESANTI 08/19/19 AT 0050 HS Performed at [facility], [place] [address], [place], [place] [address]    Culture GROUP B STREP(S.AGALACTIAE)ISOLATED (A)  Final   Report Status 08/21/2019 FINAL  Final   Organism ID, Bacteria GROUP B STREP(S.AGALACTIAE)ISOLATED  Final      Susceptibility   Group b strep(s.agalactiae)isolated - MIC*    CLINDAMYCIN >=1 RESISTANT Resistant     AMPICILLIN <=0.25 SENSITIVE Sensitive     ERYTHROMYCIN >=8 RESISTANT Resistant     VANCOMYCIN 0.5 SENSITIVE Sensitive     CEFTRIAXONE <=0.12 SENSITIVE Sensitive     LEVOFLOXACIN 1 SENSITIVE Sensitive     PENICILLIN Value in next row Sensitive      SENSITIVE0.06    * GROUP B STREP(S.AGALACTIAE)ISOLATED  Blood Culture ID Panel (Reflexed)     Status: Abnormal   Collection Time: 08/18/19  2:34 PM  Result Value Ref Range Status   Enterococcus species NOT DETECTED NOT DETECTED Final   Listeria monocytogenes NOT DETECTED NOT DETECTED Final   Staphylococcus species NOT DETECTED NOT DETECTED Final  Staphylococcus aureus (BCID) NOT DETECTED NOT DETECTED Final   Streptococcus species DETECTED (A) NOT DETECTED Final    Comment: CRITICAL RESULT CALLED TO, READ BACK BY AND VERIFIED WITH: DAVID BESANTI 08/19/19 AT 0050 HS    Streptococcus agalactiae DETECTED (A) NOT DETECTED Final    Comment: CRITICAL RESULT CALLED TO, READ BACK BY AND VERIFIED WITH: DAVID BESANTI 08/19/19 AT 0050 HS    Streptococcus pneumoniae NOT DETECTED NOT DETECTED Final   Streptococcus pyogenes NOT DETECTED NOT DETECTED Final   Acinetobacter baumannii NOT DETECTED NOT DETECTED Final   Enterobacteriaceae species NOT DETECTED NOT DETECTED Final   Enterobacter cloacae complex NOT DETECTED NOT DETECTED Final   Escherichia coli NOT DETECTED NOT DETECTED Final   Klebsiella oxytoca NOT DETECTED NOT DETECTED Final   Klebsiella pneumoniae NOT DETECTED NOT DETECTED Final   Proteus species NOT DETECTED NOT DETECTED Final   Serratia marcescens NOT DETECTED NOT DETECTED Final   Haemophilus influenzae NOT DETECTED NOT DETECTED Final   Neisseria meningitidis NOT DETECTED NOT DETECTED Final   Pseudomonas aeruginosa NOT DETECTED NOT DETECTED Final   Candida albicans NOT DETECTED NOT DETECTED Final   Candida glabrata NOT DETECTED NOT DETECTED Final   Candida krusei NOT DETECTED NOT DETECTED Final   Candida parapsilosis NOT DETECTED NOT DETECTED Final   Candida tropicalis NOT DETECTED NOT DETECTED Final    Comment: Performed at [facility], [address] [place], [place] [address]  Blood Culture (routine x 2)     Status: Abnormal   Collection Time: 08/18/19  2:44 PM   Specimen: BLOOD  Result Value Ref Range Status   Specimen Description   Final    BLOOD RIGHT ANTECUBITAL Performed at [facility], [address], [place], [place] [address]    Special Requests   Final    BOTTLES DRAWN AEROBIC AND ANAEROBIC Blood Culture adequate volume Performed at [facility], [address]  [place], [place] [address]    Culture  Setup Time   Final    GRAM POSITIVE COCCI IN BOTH AEROBIC AND ANAEROBIC BOTTLES CRITICAL VALUE NOTED.  VALUE IS CONSISTENT WITH PREVIOUSLY REPORTED AND CALLED VALUE. Performed at [facility], [address] [place], [place] [address]    Culture (A)  Final    GROUP B STREP(S.AGALACTIAE)ISOLATED SUSCEPTIBILITIES PERFORMED ON PREVIOUS CULTURE WITHIN THE LAST 5 DAYS. Performed at [facility], [place] [address], [place],  [address]    Report Status 08/21/2019 FINAL  Final  SARS Coronavirus 2 by RT PCR (hospital order, performed in [facility] hospital lab) Nasopharyngeal Nasopharyngeal Swab     Status: None   Collection Time: 08/18/19  2:50 PM   Specimen: Nasopharyngeal Swab  Result Value Ref Range Status   SARS Coronavirus 2 NEGATIVE NEGATIVE Final    Comment: (NOTE) SARS-CoV-2 target nucleic acids are NOT DETECTED. The SARS-CoV-2 RNA is generally detectable in upper and lower respiratory specimens during the acute phase of infection. The lowest concentration of SARS-CoV-2 viral copies this assay can detect is 250 copies / mL. A negative result does not preclude SARS-CoV-2 infection and should not be used as the sole basis for treatment or other patient management decisions.  A negative result may occur with improper specimen collection / handling, submission of specimen other than nasopharyngeal swab, presence of viral mutation(s) within the areas targeted by this assay, and inadequate number of viral copies (<250 copies / mL). A negative result must be combined with clinical observations, patient history, and  epidemiological information. Fact Sheet for Patients:   StrictlyIdeas.no Fact Sheet for Healthcare Providers: BankingDealers.co.za This test is not yet approved or cleared  by the [place] FDA and has been authorized for detection and/or diagnosis of SARS-CoV-2 by FDA  under an Emergency Use Authorization (EUA).  This EUA will remain in effect (meaning this test can be used) for the duration of the COVID-19 declaration under Section 564(b)(1) of the Act, 21 U.S.C. section 360bbb-3(b)(1), unless the authorization is terminated or revoked sooner. Performed at [facility], [address] [place], [place] [address]   Culture, sputum-assessment     Status: None   Collection Time: 08/19/19  1:11 AM   Specimen: Sputum  Result Value Ref Range Status   Specimen Description SPUTUM  Final   Special Requests NONE  Final   Sputum evaluation   Final    Sputum specimen not acceptable for testing.  Please recollect.   C/CHARLIE FLEETWOOD AT 1638 08/19/19.PMF Performed at [facility], [address] [place], [place] [address]    Report Status 08/19/2019 FINAL  Final  MRSA PCR Screening     Status: None   Collection Time: 08/19/19  1:30 AM   Specimen: Nasal Mucosa; Nasopharyngeal  Result Value Ref Range Status   MRSA by PCR NEGATIVE NEGATIVE Final    Comment:        The GeneXpert MRSA Assay (FDA approved for NASAL specimens only), is one component of a comprehensive MRSA colonization surveillance program. It is not intended to diagnose MRSA infection nor to guide or monitor treatment for MRSA infections. Performed at [facility], [address] [place], [place] [address]   Gastrointestinal Panel by PCR , Stool     Status: None   Collection Time: 08/19/19 12:56 PM   Specimen: Stool  Result Value Ref Range Status   Campylobacter species NOT DETECTED NOT DETECTED Final   Plesimonas shigelloides NOT DETECTED NOT DETECTED Final   Salmonella species NOT DETECTED NOT DETECTED Final   Yersinia enterocolitica NOT DETECTED NOT DETECTED Final   Vibrio species NOT DETECTED NOT DETECTED Final   Vibrio cholerae NOT DETECTED NOT DETECTED Final   Enteroaggregative E coli (EAEC) NOT DETECTED NOT DETECTED Final   Enteropathogenic E coli  (EPEC) NOT DETECTED NOT DETECTED Final   Enterotoxigenic E coli (ETEC) NOT DETECTED NOT DETECTED Final   Shiga like toxin producing E coli (STEC) NOT DETECTED NOT DETECTED Final   Shigella/Enteroinvasive E coli (EIEC) NOT DETECTED NOT DETECTED Final   Cryptosporidium NOT DETECTED NOT DETECTED Final   Cyclospora cayetanensis NOT DETECTED NOT DETECTED Final   Entamoeba histolytica NOT DETECTED NOT DETECTED Final   Giardia lamblia NOT DETECTED NOT DETECTED Final   Adenovirus F40/41 NOT DETECTED NOT DETECTED Final   Astrovirus NOT DETECTED NOT DETECTED Final   Norovirus GI/GII NOT DETECTED NOT DETECTED Final   Rotavirus A NOT DETECTED NOT DETECTED Final   Sapovirus (I, II, IV, and V) NOT DETECTED NOT DETECTED Final    Comment: Performed at [facility], [address], [place], [place] [address]  Urine culture     Status: None   Collection Time: 08/20/19  9:24 AM   Specimen: Urine  Result Value Ref Range Status   Specimen Description   Final    IN/OUT CATH URINE Performed at [facility], [address], [place],  [address]    Special Requests   Final    NONE Performed at [facility], [address] [place], [place] [address]    Culture   Final    NO GROWTH Performed at [facility], [place] [address], [place], [place] [address]    Report Status 08/21/2019 FINAL  Final  C Difficile Quick Screen w PCR reflex     Status: None   Collection Time: 08/20/19 12:01 PM   Specimen: STOOL  Result Value Ref Range Status   C Diff antigen NEGATIVE NEGATIVE Final   C Diff toxin NEGATIVE NEGATIVE Final   C Diff interpretation No C. difficile detected.  Final    Comment: Performed at [facility], [address] [place], [place] [address]  CULTURE, BLOOD (ROUTINE X 2) w Reflex to ID Panel     Status: None   Collection Time: 08/20/19  1:07 PM   Specimen: BLOOD  Result Value Ref Range Status   Specimen Description BLOOD [facility]  Final   Special  Requests   Final    BOTTLES DRAWN AEROBIC AND ANAEROBIC Blood Culture adequate volume   Culture   Final    NO GROWTH 5 DAYS Performed at [facility], [address] [place], [place] [address]    Report Status 08/25/2019 FINAL  Final  CULTURE, BLOOD (ROUTINE X 2) w Reflex to ID Panel     Status: None   Collection Time: 08/20/19  1:22 PM   Specimen: BLOOD  Result Value Ref Range Status   Specimen Description BLOOD [facility]  Final   Special Requests   Final    BOTTLES DRAWN AEROBIC AND ANAEROBIC Blood Culture adequate volume   Culture   Final    NO GROWTH 5 DAYS Performed at [facility], [address], [place],  [address]    Report Status 08/25/2019 FINAL  Final     Terri Piedra, NP [facility]  08/25/2019  9:19 AM

## 2019-08-25 NOTE — Anesthesia Postprocedure Evaluation (Signed)
Anesthesia Post Note  Patient: Thomas [name]  Procedure(s) Performed: ICD LEAD EXTRACTION (Left Chest) TRANSESOPHAGEAL ECHOCARDIOGRAM (TEE) (N/A )     Patient location during evaluation: ICU Anesthesia Type: General Level of consciousness: awake and alert Pain management: pain level controlled Vital Signs Assessment: post-procedure vital signs reviewed and stable Respiratory status: spontaneous breathing, nonlabored ventilation and respiratory function stable Cardiovascular status: blood pressure returned to baseline and stable Postop Assessment: no apparent nausea or vomiting Anesthetic complications: no   No complications documented.  Last Vitals:  Vitals:   08/25/19 1210 08/25/19 2000  BP: (!) 106/58 (!) 100/56  Pulse:  60  Resp: 18   Temp: 36.9 C   SpO2:      Last Pain:  Vitals:   08/25/19 1210  TempSrc: Oral  PainSc:                  Caroly Purewal,W. EDMOND

## 2019-08-25 NOTE — Consult Note (Addendum)
ELECTROPHYSIOLOGY CONSULT NOTE    Patient ID: Thomas [name] MRN: [id number], DOB/AGE: 1935-02-17 85 y.o.  Admit date: 08/24/2019 Date of Consult: 08/25/2019  Primary Physician: [name], MD Primary Cardiologist: Cro[place] Electrophysiologist: [name]  Patient Profile: Thomas [name] is a 85 y.o. 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 due to CHB with change-out 2007, 2014 Medtronic CRT-D due to EF 30% despite GDMT, multiple catheterizations / PCIs, PCI/BMS placed SVG-OM 2015, known CTO of the SVG-RCA, HFrEF with recovery of EF by most recent  (55-60%, 08/2019), lymphedema, AAA and aortic root dilation, remote strokes as seen on CT imaging, permanent Afib not on [place] due to GIB, s/p transfusion / known AVMs / falls, anemia, CKDIII, prostate CA, HTN, HLD, and transferred today from [facility] to [facility] for recurrent bacteremia/ group B streptococcus with TEE without evidence of vegetations though ID / EP recommendation for extraction of device.  HPI:  Thomas [name] is a 85 y.o. male with the above past medical history. He has had recurrent group B strep bacteremia. He was readmitted to [facility] recently and recommendations are for device extraction. He was transferred to [facility] for EP evaluation and extraction.   He denies chest pain, palpitations, dyspnea, PND, orthopnea, nausea, vomiting, dizziness, syncope, edema, weight gain, or early satiety.  Past Medical History:  Diagnosis Date  . AICD (automatic cardioverter/defibrillator) present   . Bacteremia due to group B Streptococcus    Recurrent admissions for group B Strepotococcus bacteremia of unknown source with TEEs negative for vegetation 06/2019 and 08/2019  . Biventricular ICD (implantable cardioverter-defibrillator) in place 03/24/2005   Implantation of a Medtronic Adapta ADDRO1, serial number [id number]  .  CHF (congestive heart failure) ([place])   . CKD (chronic kidney disease), stage III   . Coronary artery disease    a. s/p CABG 1986. b. Multiple PCIs/caths. c. 09/2013: s/p PTCA and BMS to SVG-OM.  [name]    Requires sign language interpreter  . Dysrhythmia   . History of abdominal aortic aneurysm   . History of bleeding peptic ulcer 1980  . History of epididymitis 2013  . HTN (hypertension)   . Hydronephrosis with ureteropelvic junction obstruction   . Hydroureter on left 2009  . Hypertension   . Ischemic cardiomyopathy    a. Prior EF 30-35%, s/p BIV-ICD. b. 09/2013: EF 45-50%.  . Moderate tricuspid regurgitation   . PAF (paroxysmal atrial fibrillation) (HCC)    Not on [place] 2/2 GIB  . Presence of permanent cardiac pacemaker 2002   Original placed in 2002 for CHB then 2007 and 2014 device change out  . Prostate cancer ([place])   . Status post coronary artery bypass grafting 1986   LIMA to the LAD, SVG to OM, SVG to RCA  . Testicular swelling      Surgical History:  Past Surgical History:  Procedure Laterality Date  . 2-D echocardiogram  11/20/2011   Ejection fraction 30-35% moderate concentric left ventricular hypertrophy. Left atrium is moderately dilated. Mild MR. Mild or  . BI-VENTRICULAR IMPLANTABLE CARDIOVERTER DEFIBRILLATOR N/A 12/16/2012   Procedure: BI-VENTRICULAR IMPLANTABLE CARDIOVERTER DEFIBRILLATOR  (CRT-D);  Surgeon: [name], MD;  Location: [facility] CATH LAB;  Service: Cardiovascular;  Laterality: N/A;  . CARDIAC CATHETERIZATION  12/10/2011   SVG to OM widely patent.  LIMA to LAD patent  . CATARACT EXTRACTION W/PHACO Right 10/12/2017   Procedure:  CATARACT EXTRACTION PHACO AND INTRAOCULAR LENS PLACEMENT (IOC);  Surgeon: Birder Robson, MD;  Location: [facility] ORS;  Service: Ophthalmology;  Laterality: Right;  [place] 00:57 AP% 15.9 CDE 9.07 Fluid pack lot # [id number]  . COLONOSCOPY N/A 07/13/2018   Procedure: COLONOSCOPY;  Surgeon: Toledo, Benay Pike, MD;  Location: [facility] ENDOSCOPY;   Service: Gastroenterology;  Laterality: N/A;  . CORONARY ARTERY BYPASS GRAFT  1986  . ENTEROSCOPY N/A 09/14/2018   Procedure: ENTEROSCOPY;  Surgeon: Toledo, Benay Pike, MD;  Location: [facility] ENDOSCOPY;  Service: Gastroenterology;  Laterality: N/A;  symptomatic anemia, GI blood loss anemia, melena, positive small bowel capsule endoscopy showing source of bleeding   . ENTEROSCOPY N/A 08/22/2019   Procedure: ENTEROSCOPY;  Surgeon: Lin Landsman, MD;  Location[facility] ENDOSCOPY;  Service: Gastroenterology;  Laterality: N/A;  . ESOPHAGOGASTRODUODENOSCOPY N/A 07/13/2018   Procedure: ESOPHAGOGASTRODUODENOSCOPY (EGD);  Surgeon: Toledo, Benay Pike, MD;  Location: [facility] ENDOSCOPY;  Service: Gastroenterology;  Laterality: N/A;  . ESOPHAGOGASTRODUODENOSCOPY N/A 09/14/2018   Procedure: ESOPHAGOGASTRODUODENOSCOPY (EGD);  Surgeon: Toledo, Benay Pike, MD;  Location: [facility] ENDOSCOPY;  Service: Gastroenterology;  Laterality: N/A;  SIGN LANAGUAGE INTERPRETER  . ESOPHAGOGASTRODUODENOSCOPY N/A 06/21/2019   Procedure: ESOPHAGOGASTRODUODENOSCOPY (EGD);  Surgeon: Toledo, Benay Pike, MD;  Location: [facility] ENDOSCOPY;  Service: Gastroenterology;  Laterality: N/A;  . ESOPHAGOGASTRODUODENOSCOPY (EGD) WITH PROPOFOL N/A 05/27/2018   Procedure: ESOPHAGOGASTRODUODENOSCOPY (EGD) WITH PROPOFOL;  Surgeon: Clarene Essex, MD;  Location: [employer];  Service: Endoscopy;  Laterality: N/A;  . INSERT / REPLACE / REMOVE PACEMAKER    . LEFT HEART CATHETERIZATION WITH CORONARY/GRAFT ANGIOGRAM N/A 12/10/2011   Procedure: LEFT HEART CATHETERIZATION WITH Beatrix Fetters;  Surgeon: Sanda Klein, MD;  Location: [place] CATH LAB;  Service: Cardiovascular;  Laterality: N/A;  . LEFT HEART CATHETERIZATION WITH CORONARY/GRAFT ANGIOGRAM N/A 09/25/2013   Procedure: LEFT HEART CATHETERIZATION WITH Beatrix Fetters;  Surgeon: Blane Ohara, MD;  Location: [facility] CATH LAB;  Service: Cardiovascular;  Laterality: N/A;  . Persantine Myoview  05/06/2010    Post-rest ejection fraction 30%. No significant ischemia demonstrated. Compared to previous study there is no significant change.  . TEE WITHOUT CARDIOVERSION N/A 06/22/2019   Procedure: TRANSESOPHAGEAL ECHOCARDIOGRAM (TEE);  Surgeon: Minna Merritts, MD;  Location: [facility] ORS;  Service: Cardiovascular;  Laterality: N/A;  . TRANSURETHRAL RESECTION OF PROSTATE     s/p     Medications Prior to Admission  Medication Sig Dispense Refill Last Dose  . acetaminophen (TYLENOL) 325 MG tablet Take 2 tablets (650 mg total) by mouth every 6 (six) hours as needed for mild pain or fever.     [name] albuterol (PROVENTIL) (2.5 MG/3ML) 0.083% nebulizer solution Inhale 3 mLs into the lungs every 4 (four) hours as needed for wheezing or shortness of breath. 75 mL 12   . cefTRIAXone 2 g in sodium chloride 0.9 % 100 mL Inject 2 g into the vein daily.     [name] dextromethorphan-guaiFENesin (MUCINEX DM) 30-600 MG 12hr tablet Take 1 tablet by mouth 2 (two) times daily as needed for cough.     . ferrous sulfate 325 (65 FE) MG tablet Take 1 tablet (325 mg total) by mouth daily for 30 days. 30 tablet 0   . ipratropium-albuterol (DUONEB) 0.5-2.5 (3) MG/3ML SOLN Take 3 mLs by nebulization every 6 (six) hours. 360 mL    . losartan (COZAAR) 25 MG tablet Take 0.5 tablets (12.5 mg total) by mouth daily. 30 tablet 3   . Multiple Vitamin (MULTIVITAMIN WITH MINERALS) TABS tablet Take 1 tablet by mouth daily.     [name]  ondansetron (ZOFRAN) 4 MG/2ML SOLN injection Inject 2 mLs (4 mg total) into the vein every 8 (eight) hours as needed for nausea or vomiting. 2 mL 0   . pantoprazole (PROTONIX) 40 MG injection Inject 40 mg into the vein every 12 (twelve) hours. 1 each    . simvastatin (ZOCOR) 20 MG tablet TAKE 1 TABLET BY MOUTH EVERY DAY (Patient taking differently: Take 20 mg by mouth daily. ) 90 tablet 2   . spironolactone (ALDACTONE) 25 MG tablet Take 1 tablet (25 mg total) by mouth daily. 30 tablet 3   . torsemide (DEMADEX) 20 MG tablet Take 2  tablets (40 mg total) by mouth daily. 120 tablet 3     Inpatient Medications:  . ferrous sulfate  325 mg Oral Daily  . Gerhardt's butt cream   Topical BID  . multivitamin with minerals  1 tablet Oral Daily  . pantoprazole  40 mg Intravenous Q12H  . simvastatin  20 mg Oral q1800  . sodium chloride flush  3 mL Intravenous Q12H  . torsemide  40 mg Oral Daily    Allergies:  Allergies  Allergen Reactions  . Entresto [Sacubitril-Valsartan] Swelling    And bruising of arm  . Phenazopyridine Nausea Only and Other (See Comments)    GI UPSET  . Ramipril Other (See Comments)    unk Other reaction(s): Other (See Comments), Unknown unk    Social History   Socioeconomic History  . Marital status: Married    Spouse name: Not on file  . Number of children: Not on file  . Years of education: Not on file  . Highest education level: Not on file  Occupational History  . Not on file  Tobacco Use  . Smoking status: Former Smoker    Quit date: 03/15/1985    Years since quitting: 34.4  . Smokeless tobacco: Never Used  Vaping Use  . Vaping Use: Never used  Substance and Sexual Activity  . Alcohol use: No    Comment: occas.  . Drug use: No  . Sexual activity: Yes  Other Topics Concern  . Not on file  Social History Narrative  . Not on file   Social Determinants of Health   Financial Resource Strain: Low Risk   . Difficulty of Paying Living Expenses: Not hard at all  Food Insecurity: No Food Insecurity  . Worried About Charity fundraiser in the Last Year: Never true  . Ran Out of Food in the Last Year: Never true  Transportation Needs: No Transportation Needs  . Lack of Transportation (Medical): No  . Lack of Transportation (Non-Medical): No  Physical Activity: Inactive  . Days of Exercise per Week: 0 days  . Minutes of Exercise per Session: 0 min  Stress: No Stress Concern Present  . Feeling of Stress : Not at all  Social Connections: Moderately Integrated  . Frequency of  Communication with Friends and Family: Never  . Frequency of Social Gatherings with Friends and Family: Never  . Attends Religious Services: 1 to 4 times per year  . Active Member of Clubs or Organizations: Yes  . Attends Archivist Meetings: 1 to 4 times per year  . Marital Status: Married  Human resources officer Violence: Not At Risk  . Fear of Current or Ex-Partner: No  . Emotionally Abused: No  . Physically Abused: No  . Sexually Abused: No     Family History  Problem Relation Age of Onset  . Hypertension Father  Review of Systems: All other systems reviewed and are otherwise negative except as noted above.  Physical Exam: Vitals:   08/24/19 2322 08/25/19 0326 08/25/19 0804 08/25/19 0808  BP: (!) 108/58 (!) 105/54    Pulse: 67 61    Resp: 16 17  18   Temp: 98.3 F (36.8 C) 98.4 F (36.9 C)  98 F (36.7 C)  TempSrc: Oral Oral  Oral  SpO2: 92% 95% 95%   Weight:  96.1 kg    Height:        GEN- The patient is elderly appearing, alert and oriented x 3 today.   HEENT: normocephalic, atraumatic; sclera clear, conjunctiva pink; hearing intact; oropharynx clear; neck supple, poor dentition Lungs- Clear to ausculation bilaterally, normal work of breathing.  No wheezes, rales, rhonchi Heart- Regular rate and rhythm (paced) GI- soft, non-tender, non-distended, bowel sounds present Extremities- no clubbing, cyanosis, 1+ BLE edema MS- no significant deformity or atrophy Skin- warm and dry, no rash or lesion Psych- euthymic mood, full affect Neuro- strength and sensation are intact  Labs:   Lab Results  Component Value Date   WBC 6.2 08/24/2019   HGB 8.6 (L) 08/24/2019   HCT 29.2 (L) 08/24/2019   MCV 83.4 08/24/2019   PLT 222 08/24/2019    Recent Labs  Lab 08/18/19 1353 08/19/19 0227 08/24/19 2100  NA 139   < > 141  K 3.5   < > 3.5  CL 99   < > 108  CO2 28   < > 28  BUN 57*   < > 12  CREATININE 2.34*   < > 1.06  CALCIUM 7.9*   < > 7.5*  PROT 4.9*  --    --   BILITOT 1.1  --   --   ALKPHOS 87  --   --   ALT 18  --   --   AST 46*  --   --   GLUCOSE 115*   < > 116*   < > = values in this interval not displayed.      Radiology/Studies: DG Chest 1 View  Result Date: 08/24/2019 CLINICAL DATA:  Pneumonia EXAM: CHEST  1 VIEW COMPARISON:  08/18/2019 FINDINGS: Single frontal view of the chest demonstrates stable postsurgical changes from median sternotomy. Multi lead pacer again noted. Cardiac silhouette is enlarged. There is progressive central vascular congestion and bibasilar veiling opacities, left greater than right, consistent with airspace disease and effusions. No pneumothorax. IMPRESSION: 1. Worsening airspace disease, effusions, and central vascular congestion, most consistent with congestive heart failure. Superimposed infection cannot be excluded. Electronically Signed   By: Randa Ngo M.D.   On: 08/24/2019 21:49   DG Chest Port 1 View  Result Date: 08/18/2019 CLINICAL DATA:  Fever and weakness EXAM: PORTABLE CHEST 1 VIEW COMPARISON:  July 14, 2019 FINDINGS: There is cardiomegaly with pulmonary vascularity normal. Pacemaker leads are attached to right atrium, right ventricle, and coronary sinus. There is consolidation in the left lower lobe with small left pleural effusion. There is a small right pleural effusion with slight right base atelectasis. Patient is status post internal mammary bypass grafting. There is aortic atherosclerosis. No adenopathy. There is arthropathy in each shoulder. IMPRESSION: Cardiomegaly. Airspace opacity consistent with pneumonia left base. Small pleural effusions bilaterally. Postoperative changes noted. Aortic Atherosclerosis (ICD10-I70.0). Electronically Signed   By: Lowella Grip III M.D.   On: 08/18/2019 14:20   ECHOCARDIOGRAM COMPLETE  Result Date: 08/21/2019    ECHOCARDIOGRAM REPORT   Patient Name:  [name] Date of Exam: 08/21/2019 Medical Rec #:  [id number]      Height:       74.0 in Accession  #:    [id number]     Weight:       203.3 lb Date of Birth:  [birth year]     BSA:          2.188 m Patient Age:    56 years       BP:           121/57 mmHg Patient Gender: M              HR:           61 bpm. Exam Location:  [facility] Procedure: 2D Echo, Color Doppler and Cardiac Doppler Indications:     R78.81 Bacteremia  History:         Patient has prior history of Echocardiogram examinations, most                  recent 06/22/2019. ICM and CHF, Pacemaker, CKD; Risk                  Factors:Hypertension.  Sonographer:     Charmayne Sheer RDCS (AE) Referring Phys:  [id number] Sharen Hones Diagnosing Phys: Serafina Royals MD IMPRESSIONS  1. Left ventricular ejection fraction, by estimation, is 55 to 60%. The left ventricle has normal function. The left ventricle has no regional wall motion abnormalities. Left ventricular diastolic parameters were normal.  2. Right ventricular systolic function is normal. The right ventricular size is normal. There is moderately elevated pulmonary artery systolic pressure.  3. Left atrial size was mildly dilated.  4. Right atrial size was mildly dilated.  5. The mitral valve is normal in structure. Moderate mitral valve regurgitation.  6. Tricuspid valve regurgitation is moderate.  7. The aortic valve is normal in structure. Aortic valve regurgitation is trivial. FINDINGS  Left Ventricle: Left ventricular ejection fraction, by estimation, is 55 to 60%. The left ventricle has normal function. The left ventricle has no regional wall motion abnormalities. The left ventricular internal cavity size was normal in size. There is  no left ventricular hypertrophy. Left ventricular diastolic parameters were normal. Right Ventricle: The right ventricular size is normal. No increase in right ventricular wall thickness. Right ventricular systolic function is normal. There is moderately elevated pulmonary artery systolic pressure. The tricuspid regurgitant velocity is 3.12 m/s, and with an assumed right atrial  pressure of 10 mmHg, the estimated right ventricular systolic pressure is 22.2 mmHg. Left Atrium: Left atrial size was mildly dilated. Right Atrium: Right atrial size was mildly dilated. Pericardium: There is no evidence of pericardial effusion. Mitral Valve: The mitral valve is normal in structure. Moderate mitral valve regurgitation. MV peak gradient, 7.3 mmHg. The mean mitral valve gradient is 2.0 mmHg. Tricuspid Valve: The tricuspid valve is normal in structure. Tricuspid valve regurgitation is moderate. Aortic Valve: The aortic valve is normal in structure. Aortic valve regurgitation is trivial. Aortic valve mean gradient measures 5.0 mmHg. Aortic valve peak gradient measures 11.2 mmHg. Aortic valve area, by VTI measures 2.80 cm. Pulmonic Valve: The pulmonic valve was normal in structure. Pulmonic valve regurgitation is trivial. Aorta: The aortic root and ascending aorta are structurally normal, with no evidence of dilitation. IAS/Shunts: No atrial level shunt detected by color flow Doppler.  LEFT VENTRICLE PLAX 2D LVIDd:         6.16 cm      Diastology LVIDs:  5.53 cm      LV e' lateral:   13.40 cm/s LV PW:         1.11 cm      LV E/e' lateral: 9.1 LV IVS:        0.88 cm      LV e' medial:    7.29 cm/s LVOT diam:     2.30 cm      LV E/e' medial:  16.8 LV SV:         77 LV SV Index:   35 LVOT Area:     4.15 cm  LV Volumes (MOD) LV vol d, MOD A2C: 163.0 ml LV vol d, MOD A4C: 167.0 ml LV vol s, MOD A2C: 74.6 ml LV vol s, MOD A4C: 77.1 ml LV SV MOD A2C:     88.4 ml LV SV MOD A4C:     167.0 ml LV SV MOD BP:      98.1 ml RIGHT VENTRICLE RV Basal diam:  4.69 cm LEFT ATRIUM              Index       RIGHT ATRIUM           Index LA diam:        6.20 cm  2.83 cm/m  RA Area:     34.40 cm LA Vol (A2C):   109.0 ml 49.81 ml/m RA Volume:   123.00 ml 56.20 ml/m LA Vol (A4C):   130.0 ml 59.40 ml/m LA Biplane Vol: 119.0 ml 54.38 ml/m  AORTIC VALVE                    PULMONIC VALVE AV Area (Vmax):    2.59 cm      PV Vmax:       1.14 m/s AV Area (Vmean):   2.74 cm     PV Vmean:      80.900 cm/s AV Area (VTI):     2.80 cm     PV VTI:        0.252 m AV Vmax:           167.00 cm/s  PV Peak grad:  5.2 mmHg AV Vmean:          103.000 cm/s PV Mean grad:  3.0 mmHg AV VTI:            0.276 m AV Peak Grad:      11.2 mmHg AV Mean Grad:      5.0 mmHg LVOT Vmax:         104.00 cm/s LVOT Vmean:        67.900 cm/s LVOT VTI:          0.186 m LVOT/AV VTI ratio: 0.67  AORTA Ao Root diam: 4.20 cm MITRAL VALVE                TRICUSPID VALVE MV Area (PHT): 3.94 cm     TR Peak grad:   38.9 mmHg MV Peak grad:  7.3 mmHg     TR Vmax:        312.00 cm/s MV Mean grad:  2.0 mmHg MV Vmax:       1.35 m/s     SHUNTS MV Vmean:      61.1 cm/s    Systemic VTI:  0.19 m MV Decel Time: 193 msec     Systemic Diam: 2.30 cm MV E velocity: 122.50 cm/s Serafina Royals MD Electronically signed by Serafina Royals MD Signature Date/Time:  08/21/2019/5:42:42 PM    Final    ECHO TEE  Result Date: 08/23/2019    TRANSESOPHOGEAL ECHO REPORT   Patient Name:   KINGSLY KLOEPFER Date of Exam: 08/23/2019 Medical Rec #:  [id number]      Height:       74.0 in Accession #:    [id number]     Weight:       203.3 lb Date of Birth:  January 05, 1935     BSA:          2.188 m Patient Age:    73 years       BP:           108/62 mmHg Patient Gender: M              HR:           58 bpm. Exam Location:  [facility] Procedure: Transesophageal Echo, Cardiac Doppler and Color Doppler Indications:     Not listed on order  History:         Patient has prior history of Echocardiogram examinations, most                  recent 08/21/2019. CAD, Prior CABG and AICD; Risk                  Factors:Hypertension. PAF.  Sonographer:     Sherrie Sport RDCS (AE) Referring Phys:  [id number] Kate Sable Diagnosing Phys: Kate Sable MD PROCEDURE: The transesophogeal probe was passed without difficulty through the esophogus of the patient. Sedation performed by performing physician. The patient developed no complications  during the procedure. IMPRESSIONS  1. Left ventricular ejection fraction, by estimation, is 55 to 60%. The left ventricle has normal function. The left ventricle has no regional wall motion abnormalities.  2. Right ventricular systolic function is normal. The right ventricular size is normal.  3. Left atrial size was mild to moderately dilated. No left atrial/left atrial appendage thrombus was detected.  4. Right atrial size was mildly dilated.  5. The mitral valve is normal in structure. Mild mitral valve regurgitation.  6. Tricuspid valve regurgitation is moderate to severe.  7. The aortic valve is tricuspid. Aortic valve regurgitation is mild. Conclusion(s)/Recommendation(s): No evidence of vegetation/infective endocarditis on this transesophageal echocardiogram. FINDINGS  Left Ventricle: Left ventricular ejection fraction, by estimation, is 55 to 60%. The left ventricle has normal function. The left ventricle has no regional wall motion abnormalities. The left ventricular internal cavity size was normal in size. There is  no left ventricular hypertrophy. Right Ventricle: The right ventricular size is normal. No increase in right ventricular wall thickness. Right ventricular systolic function is normal. Left Atrium: Left atrial size was mild to moderately dilated. No left atrial/left atrial appendage thrombus was detected. Right Atrium: Right atrial size was mildly dilated. Pericardium: There is no evidence of pericardial effusion. Mitral Valve: The mitral valve is normal in structure. Mild mitral valve regurgitation. Tricuspid Valve: The tricuspid valve is grossly normal. Tricuspid valve regurgitation is moderate to severe. Aortic Valve: The aortic valve is tricuspid. Aortic valve regurgitation is mild. Pulmonic Valve: The pulmonic valve was not well visualized. Pulmonic valve regurgitation is trivial. Aorta: The aortic root is normal in size and structure. Venous: The inferior vena cava was not well visualized.  IAS/Shunts: No atrial level shunt detected by color flow Doppler. Additional Comments: A pacer wire is visualized. Kate Sable MD Electronically signed by Kate Sable MD Signature Date/Time: 08/23/2019/4:10:21 PM  Final    CUP PACEART REMOTE DEVICE CHECK  Result Date: 08/16/2019 Scheduled remote reviewed. Normal device function.  Battery estimated 1 month to ERI, wireless transmitting. Next remote 91 days. [place]   TELEMETRY: V pacing (personally reviewed)  DEVICE HISTORY: MDT dual chamber PPM implanted 2000 (RA 4244, RV 4285 leads), upgrade to MDT CRTD 2014 (RV 6935, LV 4298)  Assessment/Plan: 1.  Recurrent group B strep bacteremia Plan device system extraction and insertion of temp pacemaker later today Risks, benefits reviewed with patient through assistance of deaf interpreter ID consult for [facility] admission pending Will also ask for dental consult on Monday   2.  CAD s/p CABG No recent ischemic symptoms  3.  Chronic systolic heart failure Good response to IV lasix yesterday Will follow fluid status closely - will give another dose of IV lasix this morning  3.  Anemia Per IM  4.  Permanent AF No OAC 2/2 GI bleed   For questions or updates, please contact [place] Please consult [email or web address] for contact info under Cardiology/STEMI.  Signed, Chanetta Marshall, NP 08/25/2019 8:38 AM  EP Attending  Patient seen and examined. Agree with above. The patient presents in transfer from [facility] for ICD system extraction. He has had recurrent strep bacteremia, and no obvious source. I do note that he has bad teeth and after his system is removed, will need dental evaluation prior to any new permanent device insertion. I have reviewed the indications/risks/benefits/goals/expectations of ICD system removal and he wishes to proceed.  Salome Spotted.

## 2019-08-25 NOTE — Progress Notes (Signed)
PHARMACY CONSULT NOTE FOR:  OUTPATIENT  PARENTERAL ANTIBIOTIC THERAPY (OPAT)  Indication: Group B strep bacteremia with pacemaker  Regimen: Ceftriaxone 2g IV q24h End date: 09/22/2019  IV antibiotic discharge orders are pended. To discharging provider:  please sign these orders via discharge navigator,  Select New Orders & click on the button choice - Manage This Unsigned Work.     Thank you for allowing pharmacy to be a part of this patient's care.  Phillis Haggis 08/25/2019, 9:42 AM

## 2019-08-25 NOTE — Anesthesia Procedure Notes (Signed)
Procedure Name: Intubation Date/Time: 08/25/2019 4:04 PM Performed by: Barrington Ellison, CRNA Pre-anesthesia Checklist: Patient identified, Emergency Drugs available, Suction available and Patient being monitored Patient Re-evaluated:Patient Re-evaluated prior to induction Oxygen Delivery Method: Circle System Utilized Preoxygenation: Pre-oxygenation with 100% oxygen Induction Type: IV induction Ventilation: Mask ventilation without difficulty Laryngoscope Size: Mac and 4 Grade View: Grade I Tube type: Oral Tube size: 7.5 mm Number of attempts: 1 Airway Equipment and Method: Stylet and Oral airway Placement Confirmation: ETT inserted through vocal cords under direct vision,  positive ETCO2 and breath sounds checked- equal and bilateral Secured at: 23 cm Tube secured with: Tape Dental Injury: Teeth and Oropharynx as per pre-operative assessment

## 2019-08-25 NOTE — Progress Notes (Signed)
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 At [facility] patient was followed by both Dr. [name] with ID and  Dr. [name] with Cardiology. TEE was performed on 6/9 with no evidence of vegetations. Despite this finding, infectious disease recommended transfer to [facility] for removal of ICD considering recurrent group B strep bacteremia. During patient's hospital course, patient also was found to be anemic s/p 2U PRBC. Dr. [name] with GI was consulted who performed small bowel enteroscopy revealing no evidence of significant pathology in the duodenum, jejunum, esophagus or stomach. Pt transferred for further management.    Today, sign language via interpreter service was used to communicate as pt is deaf. Pt denies any new complaints, denies any chest pain, worsening SOB, abdominal pain, melena, N/V, fever/chills.     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 (HCC)   Acute renal failure superimposed on stage 3a chronic kidney  disease (HCC)   Sepsis due to group B Streptococcus (HCC)   Duodenal arteriovenous malformation   Candidal intertrigo   Edema of left upper extremity   Pneumonia of left lower lobe due to group B Streptococcus (HCC)   Septic shock likely 2/2 recurrent Group B streptococcus bacteremia Unknown source, of note, very poor dentition (will need dental evaluation) Currently afebrile, with no leukocytosis BC X 2 done on 08/20/19 showed NGTD TEE was performed on 6/9 with no evidence of vegetations ID on board, rec removal of AICD due to recurrence of bacteremia, plan for 4 weeks of IV rocephin after AICD removal with repeat BC, and waiting 2 days prior to placing PICC line EP on board, plan for AICD removal on 08/25/19 Continue Rocephin Outpt follow up with ID, Dr [name] Monitor closely  Left base PNA Currently on RA, afebrile Initial CXR with L base PNA, repeat with worsening congestion Continue IV Ceftriaxone Supplemental O2 as needed, duoneb prn, inhalers  Acute on chronic systolic HF CXR as above Recent TEE with EF of 55-60% EP/cardiology on board, rec IV lasix prn Plan to restart home torsemide, cozaar, aldactone once BP permits Strict I&O, daily weight  LUE edema USS negative for DVT in 06/2019, repeat pending   Acute on chronic blood loss anemia with iron def Baseline hemoglobin between 7-8, noted to drop S/P SB capsule endoscopy without any source of bleeding S/P IV iron, 2U PRBC Continue PPI, hold AC Daily CBC  Permanent Afib Rate controlled, paced rhythm Currently not on AC due to GIB Telemetry  CAD/ CABG, DES/ HLD Chest pain free Continue statin  HTN BP  stable Restart cozaar once able  CKD stage 3a Stable at baseline Daily BMP        Malnutrition Type:      Malnutrition Characteristics:      Nutrition Interventions:       Estimated body mass index is 27.2 kg/m as calculated from the following:   Height as of this encounter: 6\' 2"   (1.88 m).   Weight as of this encounter: 96.1 kg.     Code Status: Full  Family Communication: Discussed with patient via interpreter using sign language  Disposition Plan: Status is: Inpatient  Remains inpatient appropriate because:Inpatient level of care appropriate due to severity of illness   Dispo: The patient is from: Home              Anticipated d/c is to: Home              Anticipated d/c date is: 3 days              Patient currently is not medically stable to d/c.    Consultants:  EP/Cardiology  ID  GI  Procedures:  Removal of AICD  SB enteroscopy  Antimicrobials: IV ceftriaxone  DVT prophylaxis: SCD   Objective: Vitals:   08/25/19 0326 08/25/19 0804 08/25/19 0808 08/25/19 1210  BP: (!) 105/54   (!) 106/58  Pulse: 61     Resp: 17  18 18   Temp: 98.4 F (36.9 C)  98 F (36.7 C) 98.5 F (36.9 C)  TempSrc: Oral  Oral Oral  SpO2: 95% 95%    Weight: 96.1 kg     Height:        Intake/Output Summary (Last 24 hours) at 08/25/2019 1247 Last data filed at 08/25/2019 1214 Gross per 24 hour  Intake 240 ml  Output 1475 ml  Net -1235 ml   Filed Weights   08/24/19 1853 08/25/19 0326  Weight: 96 kg 96.1 kg    Exam:  General: NAD, deaf  Cardiovascular: S1, S2 present  Respiratory: Mild bibasilar crackles noted  Abdomen: Soft, nontender, nondistended, bowel sounds present  Musculoskeletal: 1+ bilateral pedal edema noted, chronic lymphedema, LUE edema  Skin: Rash noted around groin and buttocks  Psychiatry: Normal mood   Data Reviewed: CBC: Recent Labs  Lab 08/18/19 1353 08/18/19 2029 08/20/19 0446 08/20/19 1322 08/21/19 1749 08/22/19 0320 08/23/19 0448 08/24/19 2100 08/25/19 0947  WBC 10.9*   < > 9.2  --   --  7.1 6.3 6.2 5.2  NEUTROABS 9.4*  --   --   --   --  5.2 4.2 4.4 3.5  HGB 7.4*   < > 8.7*   < > 9.3* 8.8* 9.0* 8.6* 8.7*  HCT 24.4*   < > 26.5*  --   --  29.4* 28.8* 29.2* 29.3*  MCV 78.5*   < > 77.3*  --   --  80.8  79.3* 83.4 83.0  PLT 181   < > 131*  --   --  160 180 222 203   < > = values in this interval not displayed.   Basic Metabolic Panel: Recent Labs  Lab 08/18/19 2351 08/19/19 0227 08/21/19 0504 08/22/19 0320 08/23/19 0448 08/24/19 2100 08/25/19 0947  NA  --    < > 141 141 145 141 142  K  --    < > 3.3* 3.2* 3.5 3.5 3.6  CL  --    < > 108 107 111 108 108  CO2  --    < >  28 27 29 28 27   GLUCOSE  --    < > 103* 96 86 116* 99  BUN  --    < > 48* 31* 20 12 11   CREATININE  --    < > 1.42* 1.07 0.88 1.06 0.99  CALCIUM  --    < > 7.8* 7.8* 7.6* 7.5* 7.5*  MG 2.0  --  2.2 2.3 2.3 2.1  --   PHOS  --   --   --   --   --  2.3*  --    < > = values in this interval not displayed.   GFR: Estimated Creatinine Clearance: 66.9 mL/min (by C-G formula based on SCr of 0.99 mg/dL). Liver Function Tests: Recent Labs  Lab 08/18/19 1353 08/24/19 2100  AST 46*  --   ALT 18  --   ALKPHOS 87  --   BILITOT 1.1  --   PROT 4.9*  --   ALBUMIN 1.9* 1.6*   No results for input(s): LIPASE, AMYLASE in the last 168 hours. No results for input(s): AMMONIA in the last 168 hours. Coagulation Profile: Recent Labs  Lab 08/18/19 2029 08/24/19 2100  INR 1.6* 1.3*   Cardiac Enzymes: No results for input(s): CKTOTAL, CKMB, CKMBINDEX, TROPONINI in the last 168 hours. BNP (last 3 results) No results for input(s): PROBNP in the last 8760 hours. HbA1C: No results for input(s): HGBA1C in the last 72 hours. CBG: Recent Labs  Lab 08/25/19 0518  GLUCAP 93   Lipid Profile: No results for input(s): CHOL, HDL, LDLCALC, TRIG, CHOLHDL, LDLDIRECT in the last 72 hours. Thyroid Function Tests: No results for input(s): TSH, T4TOTAL, FREET4, T3FREE, THYROIDAB in the last 72 hours. Anemia Panel: No results for input(s): VITAMINB12, FOLATE, FERRITIN, TIBC, IRON, RETICCTPCT in the last 72 hours. Urine analysis:    Component Value Date/Time   COLORURINE YELLOW (A) 08/20/2019 0924   APPEARANCEUR HAZY (A) 08/20/2019  0924   APPEARANCEUR Clear 12/13/2017 0918   LABSPEC 1.015 08/20/2019 0924   LABSPEC 1.021 11/19/2011 1501   PHURINE 5.0 08/20/2019 0924   GLUCOSEU NEGATIVE 08/20/2019 0924   GLUCOSEU Negative 11/19/2011 1501   HGBUR NEGATIVE 08/20/2019 0924   BILIRUBINUR NEGATIVE 08/20/2019 0924   BILIRUBINUR Negative 12/13/2017 0918   BILIRUBINUR Negative 11/19/2011 1501   KETONESUR NEGATIVE 08/20/2019 0924   PROTEINUR NEGATIVE 08/20/2019 0924   NITRITE NEGATIVE 08/20/2019 0924   LEUKOCYTESUR NEGATIVE 08/20/2019 0924   LEUKOCYTESUR Negative 11/19/2011 1501   Sepsis Labs: @LABRCNTIP (procalcitonin:4,lacticidven:4)  ) Recent Results (from the past 240 hour(s))  Blood Culture (routine x 2)     Status: Abnormal   Collection Time: 08/18/19  2:34 PM   Specimen: BLOOD  Result Value Ref Range Status   Specimen Description   Final    BLOOD BLOOD RIGHT HAND Performed at [facility], [address] [place], [place] [address]    Special Requests   Final    BOTTLES DRAWN AEROBIC AND ANAEROBIC Blood Culture adequate volume Performed at [facility], [address] [place], [place] [address]    Culture  Setup Time   Final    GRAM POSITIVE COCCI IN BOTH AEROBIC AND ANAEROBIC BOTTLES CRITICAL RESULT CALLED TO, READ BACK BY AND VERIFIED WITH: DAVID BESANTI 08/19/19 AT 0050 HS Performed at [facility], [place] [address] [place], [place] [address]    Culture GROUP B STREP(S.AGALACTIAE)ISOLATED (A)  Final   Report Status 08/21/2019 FINAL  Final   Organism ID, Bacteria GROUP  B STREP(S.AGALACTIAE)ISOLATED  Final      Susceptibility   Group b strep(s.agalactiae)isolated - MIC*    CLINDAMYCIN >=1 RESISTANT Resistant     AMPICILLIN <=0.25 SENSITIVE Sensitive     ERYTHROMYCIN >=8 RESISTANT Resistant     VANCOMYCIN 0.5 SENSITIVE Sensitive     CEFTRIAXONE <=0.12 SENSITIVE Sensitive     LEVOFLOXACIN 1 SENSITIVE Sensitive     PENICILLIN Value in next row Sensitive      SENSITIVE0.06     * GROUP B STREP(S.AGALACTIAE)ISOLATED  Blood Culture ID Panel (Reflexed)     Status: Abnormal   Collection Time: 08/18/19  2:34 PM  Result Value Ref Range Status   Enterococcus species NOT DETECTED NOT DETECTED Final   Listeria monocytogenes NOT DETECTED NOT DETECTED Final   Staphylococcus species NOT DETECTED NOT DETECTED Final   Staphylococcus aureus (BCID) NOT DETECTED NOT DETECTED Final   Streptococcus species DETECTED (A) NOT DETECTED Final    Comment: CRITICAL RESULT CALLED TO, READ BACK BY AND VERIFIED WITH: DAVID BESANTI 08/19/19 AT 0050 HS    Streptococcus agalactiae DETECTED (A) NOT DETECTED Final    Comment: CRITICAL RESULT CALLED TO, READ BACK BY AND VERIFIED WITH: DAVID BESANTI 08/19/19 AT 0050 HS    Streptococcus pneumoniae NOT DETECTED NOT DETECTED Final   Streptococcus pyogenes NOT DETECTED NOT DETECTED Final   Acinetobacter baumannii NOT DETECTED NOT DETECTED Final   Enterobacteriaceae species NOT DETECTED NOT DETECTED Final   Enterobacter cloacae complex NOT DETECTED NOT DETECTED Final   Escherichia coli NOT DETECTED NOT DETECTED Final   Klebsiella oxytoca NOT DETECTED NOT DETECTED Final   Klebsiella pneumoniae NOT DETECTED NOT DETECTED Final   Proteus species NOT DETECTED NOT DETECTED Final   Serratia marcescens NOT DETECTED NOT DETECTED Final   Haemophilus influenzae NOT DETECTED NOT DETECTED Final   Neisseria meningitidis NOT DETECTED NOT DETECTED Final   Pseudomonas aeruginosa NOT DETECTED NOT DETECTED Final   Candida albicans NOT DETECTED NOT DETECTED Final   Candida glabrata NOT DETECTED NOT DETECTED Final   Candida krusei NOT DETECTED NOT DETECTED Final   Candida parapsilosis NOT DETECTED NOT DETECTED Final   Candida tropicalis NOT DETECTED NOT DETECTED Final    Comment: Performed at [facility], [address] [place], [place] [address]  Blood Culture (routine x 2)     Status: Abnormal   Collection Time: 08/18/19  2:44 PM   Specimen: BLOOD    Result Value Ref Range Status   Specimen Description   Final    BLOOD RIGHT ANTECUBITAL Performed at [facility], [address], [place], [place] [address]    Special Requests   Final    BOTTLES DRAWN AEROBIC AND ANAEROBIC Blood Culture adequate volume Performed at [facility], [address] [place], [place] [address]    Culture  Setup Time   Final    GRAM POSITIVE COCCI IN BOTH AEROBIC AND ANAEROBIC BOTTLES CRITICAL VALUE NOTED.  VALUE IS CONSISTENT WITH PREVIOUSLY REPORTED AND CALLED VALUE. Performed at [facility], [address] [place], [place] [address]    Culture (A)  Final    GROUP B STREP(S.AGALACTIAE)ISOLATED SUSCEPTIBILITIES PERFORMED ON PREVIOUS CULTURE WITHIN THE LAST 5 DAYS. Performed at [facility], [place] [address], [place], [place] [address]    Report Status 08/21/2019 FINAL  Final  SARS Coronavirus 2 by RT PCR (hospital order, performed in [facility] hospital lab) Nasopharyngeal Nasopharyngeal Swab     Status: None   Collection Time: 08/18/19  2:50 PM   Specimen: Nasopharyngeal Swab  Result Value Ref Range Status   SARS Coronavirus 2 NEGATIVE NEGATIVE Final    Comment: (NOTE) SARS-CoV-2 target nucleic acids are NOT DETECTED. The SARS-CoV-2 RNA is generally detectable in upper and lower respiratory specimens during the acute phase of infection. The lowest concentration of SARS-CoV-2 viral copies this assay can detect is 250 copies / mL. A negative result does not preclude SARS-CoV-2 infection and should not be used as the sole basis for treatment or other patient management decisions.  A negative result may occur with improper specimen collection / handling, submission of specimen other than nasopharyngeal swab, presence of viral mutation(s) within the areas targeted by this assay, and inadequate number of viral copies (<250 copies / mL). A negative result must be combined with clinical observations, patient history,  and epidemiological information. Fact Sheet for Patients:   StrictlyIdeas.no Fact Sheet for Healthcare Providers: BankingDealers.co.za This test is not yet approved or cleared  by the [place] FDA and has been authorized for detection and/or diagnosis of SARS-CoV-2 by FDA under an Emergency Use Authorization (EUA).  This EUA will remain in effect (meaning this test can be used) for the duration of the COVID-19 declaration under Section 564(b)(1) of the Act, 21 U.S.C. section 360bbb-3(b)(1), unless the authorization is terminated or revoked sooner. Performed at [facility], [address] [place], [place] [address]   Culture, sputum-assessment     Status: None   Collection Time: 08/19/19  1:11 AM   Specimen: Sputum  Result Value Ref Range Status   Specimen Description SPUTUM  Final   Special Requests NONE  Final   Sputum evaluation   Final    Sputum specimen not acceptable for testing.  Please recollect.   C/CHARLIE FLEETWOOD AT 3646 08/19/19.PMF Performed at [facility], [address] [place], [place] [address]    Report Status 08/19/2019 FINAL  Final  MRSA PCR Screening     Status: None   Collection Time: 08/19/19  1:30 AM   Specimen: Nasal Mucosa; Nasopharyngeal  Result Value Ref Range Status   MRSA by PCR NEGATIVE NEGATIVE Final    Comment:        The GeneXpert MRSA Assay (FDA approved for NASAL specimens only), is one component of a comprehensive MRSA colonization surveillance program. It is not intended to diagnose MRSA infection nor to guide or monitor treatment for MRSA infections. Performed at [facility], [address] [place], [place] [address]   Gastrointestinal Panel by PCR , Stool     Status: None   Collection Time: 08/19/19 12:56 PM   Specimen: Stool  Result Value Ref Range Status   Campylobacter species NOT DETECTED NOT DETECTED Final   Plesimonas shigelloides NOT  DETECTED NOT DETECTED Final   Salmonella species NOT DETECTED NOT DETECTED Final   Yersinia enterocolitica NOT DETECTED NOT DETECTED Final   Vibrio species NOT DETECTED NOT DETECTED Final   Vibrio cholerae NOT DETECTED NOT DETECTED Final   Enteroaggregative E coli (EAEC) NOT DETECTED NOT DETECTED Final   Enteropathogenic E coli (EPEC) NOT DETECTED NOT DETECTED Final   Enterotoxigenic E coli (ETEC) NOT DETECTED NOT DETECTED Final   Shiga like toxin producing E coli (STEC) NOT DETECTED NOT DETECTED Final   Shigella/Enteroinvasive E coli (EIEC) NOT DETECTED NOT DETECTED Final   Cryptosporidium NOT DETECTED NOT DETECTED Final   Cyclospora cayetanensis NOT DETECTED NOT DETECTED Final   Entamoeba histolytica NOT DETECTED NOT DETECTED Final  Giardia lamblia NOT DETECTED NOT DETECTED Final   Adenovirus F40/41 NOT DETECTED NOT DETECTED Final   Astrovirus NOT DETECTED NOT DETECTED Final   Norovirus GI/GII NOT DETECTED NOT DETECTED Final   Rotavirus A NOT DETECTED NOT DETECTED Final   Sapovirus (I, II, IV, and V) NOT DETECTED NOT DETECTED Final    Comment: Performed at [facility], [address], [place], [place] [address]  Urine culture     Status: None   Collection Time: 08/20/19  9:24 AM   Specimen: Urine  Result Value Ref Range Status   Specimen Description   Final    IN/OUT CATH URINE Performed at [facility] [address] [place], [place] [address]    Special Requests   Final    NONE Performed at [facility], [address], [place], [place] [address]    Culture   Final    NO GROWTH Performed at [facility], [place] [address] [place], [place] [address]    Report Status 08/21/2019 FINAL  Final  C Difficile Quick Screen w PCR reflex     Status: None   Collection Time: 08/20/19 12:01 PM   Specimen: STOOL  Result Value Ref Range Status   C Diff antigen NEGATIVE NEGATIVE Final   C Diff toxin NEGATIVE NEGATIVE Final   C Diff interpretation No  C. difficile detected.  Final    Comment: Performed at [facility], [address] [place], [place] [address]  CULTURE, BLOOD (ROUTINE X 2) w Reflex to ID Panel     Status: None   Collection Time: 08/20/19  1:07 PM   Specimen: BLOOD  Result Value Ref Range Status   Specimen Description BLOOD [facility]  Final   Special Requests   Final    BOTTLES DRAWN AEROBIC AND ANAEROBIC Blood Culture adequate volume   Culture   Final    NO GROWTH 5 DAYS Performed at [facility], [address] [place], [place] [address]    Report Status 08/25/2019 FINAL  Final  CULTURE, BLOOD (ROUTINE X 2) w Reflex to ID Panel     Status: None   Collection Time: 08/20/19  1:22 PM   Specimen: BLOOD  Result Value Ref Range Status   Specimen Description BLOOD [facility]  Final   Special Requests   Final    BOTTLES DRAWN AEROBIC AND ANAEROBIC Blood Culture adequate volume   Culture   Final    NO GROWTH 5 DAYS Performed at [facility], [address], [place], [place] [address]    Report Status 08/25/2019 FINAL  Final      Studies: DG Chest 1 View  Result Date: 08/24/2019 CLINICAL DATA:  Pneumonia EXAM: CHEST  1 VIEW COMPARISON:  08/18/2019 FINDINGS: Single frontal view of the chest demonstrates stable postsurgical changes from median sternotomy. Multi lead pacer again noted. Cardiac silhouette is enlarged. There is progressive central vascular congestion and bibasilar veiling opacities, left greater than right, consistent with airspace disease and effusions. No pneumothorax. IMPRESSION: 1. Worsening airspace disease, effusions, and central vascular congestion, most consistent with congestive heart failure. Superimposed infection cannot be excluded. Electronically Signed   By: Randa Ngo M.D.   On: 08/24/2019 21:49    Scheduled Meds: . ferrous sulfate  325 mg Oral Daily  . gentamicin irrigation  80 mg Irrigation On Call  . Gerhardt's butt cream   Topical BID  . multivitamin with minerals   1 tablet Oral Daily  . pantoprazole  40 mg Intravenous Q12H  . simvastatin  20 mg Oral q1800  . sodium chloride flush  3 mL Intravenous Q12H    Continuous Infusions: .  ceFAZolin (ANCEF) IV    . cefTRIAXone (ROCEPHIN) IVPB 2 gram/100 mL NS (Mini-Bag Plus) 2 g (08/25/19 0104)     LOS: 1 day     Alma Friendly, MD [facility]  If 7PM-7AM, please contact night-coverage [email or web address] 08/25/2019, 12:47 PM

## 2019-08-26 ENCOUNTER — Inpatient Hospital Stay (HOSPITAL_COMMUNITY): Payer: PPO

## 2019-08-26 DIAGNOSIS — I255 Ischemic cardiomyopathy: Secondary | ICD-10-CM

## 2019-08-26 DIAGNOSIS — N179 Acute kidney failure, unspecified: Secondary | ICD-10-CM

## 2019-08-26 DIAGNOSIS — T827XXD Infection and inflammatory reaction due to other cardiac and vascular devices, implants and grafts, subsequent encounter: Secondary | ICD-10-CM

## 2019-08-26 DIAGNOSIS — A401 Sepsis due to streptococcus, group B: Secondary | ICD-10-CM

## 2019-08-26 DIAGNOSIS — I4821 Permanent atrial fibrillation: Secondary | ICD-10-CM

## 2019-08-26 DIAGNOSIS — R6520 Severe sepsis without septic shock: Secondary | ICD-10-CM

## 2019-08-26 DIAGNOSIS — R609 Edema, unspecified: Secondary | ICD-10-CM

## 2019-08-26 LAB — CBC WITH DIFFERENTIAL/PLATELET
Abs Immature Granulocytes: 0.06 10*3/uL (ref 0.00–0.07)
Basophils Absolute: 0 10*3/uL (ref 0.0–0.1)
Basophils Relative: 0 %
Eosinophils Absolute: 0 10*3/uL (ref 0.0–0.5)
Eosinophils Relative: 0 %
HCT: 28.5 % — ABNORMAL LOW (ref 39.0–52.0)
Hemoglobin: 8.3 g/dL — ABNORMAL LOW (ref 13.0–17.0)
Immature Granulocytes: 1 %
Lymphocytes Relative: 7 %
Lymphs Abs: 0.4 10*3/uL — ABNORMAL LOW (ref 0.7–4.0)
MCH: 24.7 pg — ABNORMAL LOW (ref 26.0–34.0)
MCHC: 29.1 g/dL — ABNORMAL LOW (ref 30.0–36.0)
MCV: 84.8 fL (ref 80.0–100.0)
Monocytes Absolute: 0.2 10*3/uL (ref 0.1–1.0)
Monocytes Relative: 3 %
Neutro Abs: 6 10*3/uL (ref 1.7–7.7)
Neutrophils Relative %: 89 %
Platelets: 192 10*3/uL (ref 150–400)
RBC: 3.36 MIL/uL — ABNORMAL LOW (ref 4.22–5.81)
RDW: 19.9 % — ABNORMAL HIGH (ref 11.5–15.5)
WBC: 6.8 10*3/uL (ref 4.0–10.5)
nRBC: 0.3 % — ABNORMAL HIGH (ref 0.0–0.2)

## 2019-08-26 LAB — BASIC METABOLIC PANEL
Anion gap: 9 (ref 5–15)
BUN: 12 mg/dL (ref 8–23)
CO2: 25 mmol/L (ref 22–32)
Calcium: 7.2 mg/dL — ABNORMAL LOW (ref 8.9–10.3)
Chloride: 108 mmol/L (ref 98–111)
Creatinine, Ser: 1.13 mg/dL (ref 0.61–1.24)
GFR calc Af Amer: >60 mL/min (ref >60)
GFR calc non Af Amer: >60 mL/min (ref >60)
Glucose, Bld: 124 mg/dL — ABNORMAL HIGH (ref 70–99)
Potassium: 4 mmol/L (ref 3.5–5.1)
Sodium: 142 mmol/L (ref 135–145)

## 2019-08-26 LAB — GLUCOSE, CAPILLARY: Glucose-Capillary: 114 mg/dL — ABNORMAL HIGH (ref 70–99)

## 2019-08-26 LAB — MAGNESIUM: Magnesium: 1.9 mg/dL (ref 1.7–2.4)

## 2019-08-26 NOTE — Progress Notes (Signed)
Progress Note   Subjective   Sleeping but rouses  Inpatient Medications    Scheduled Meds:  sodium chloride   Intravenous Once   Chlorhexidine Gluconate Cloth  6 each Topical Daily   ferrous sulfate  325 mg Oral Daily   Gerhardt's butt cream   Topical BID   multivitamin with minerals  1 tablet Oral Daily   pantoprazole  40 mg Intravenous Q12H   simvastatin  20 mg Oral q1800   sodium chloride flush  3 mL Intravenous Q12H   Continuous Infusions:   ceFAZolin (ANCEF) IV Stopped (08/26/19 0520)   cefTRIAXone (ROCEPHIN) IVPB 2 gram/100 mL NS (Mini-Bag Plus) Stopped (08/26/19 0025)   PRN Meds: acetaminophen, acetaminophen, albuterol, dextromethorphan-guaiFENesin, ondansetron (ZOFRAN) IV   Vital Signs    Vitals:   08/26/19 0450 08/26/19 0500 08/26/19 0600 08/26/19 0700  BP:  (!) 127/57 (!) 104/54 (!) 95/55  Pulse:  60 (!) 59 60  Resp:  (!) 28 13 18   Temp:      TempSrc:      SpO2:  99% 100% 99%  Weight: 97.3 kg     Height:        Intake/Output Summary (Last 24 hours) at 08/26/2019 0756 Last data filed at 08/26/2019 0700 Gross per 24 hour  Intake 1830.2 ml  Output 1500 ml  Net 330.2 ml   Filed Weights   08/24/19 1853 08/25/19 0326 08/26/19 0450  Weight: 96 kg 96.1 kg 97.3 kg    Telemetry    Afib, V paced, rare PVCs - Personally Reviewed  Physical Exam   GEN- The patient is ill appearing, sleeping but rouses Head- normocephalic, atraumatic Ears- deaf Oropharynx- clear Neck- supple,  L subclavian line in place Lungs-  normal work of breathing Heart- Regular rate and rhythm  GI- soft  Extremities- no clubbing, cyanosis, + dependant edema  MS- no significant deformity or atrophy Skin- L chest site is without hematoma, Psych- euthymic mood, full affect Neuro- strength and sensation are intact   Labs    Chemistry Recent Labs  Lab 08/24/19 2100 08/25/19 0947 08/26/19 0201  NA 141 142 142  K 3.5 3.6 4.0  CL 108 108 108  CO2 28 27 25     GLUCOSE 116* 99 124*  BUN 12 11 12   CREATININE 1.06 0.99 1.13  CALCIUM 7.5* 7.5* 7.2*  ALBUMIN 1.6*  --   --   GFRNONAA >60 >60 >60  GFRAA >60 >60 >60  ANIONGAP 5 7 9      Hematology Recent Labs  Lab 08/24/19 2100 08/25/19 0947 08/26/19 0201  WBC 6.2 5.2 6.8  RBC 3.50* 3.53* 3.36*  HGB 8.6* 8.7* 8.3*  HCT 29.2* 29.3* 28.5*  MCV 83.4 83.0 84.8  MCH 24.6* 24.6* 24.7*  MCHC 29.5* 29.7* 29.1*  RDW 19.8* 19.9* 19.9*  PLT 222 203 192     Patient ID    Thomas [name] a 85 y.o.malewith history ofcongenitaldeafness,CAD s/p1986CABGas below, 2000 inferior STEMI with subsequent VT/ 2nd degree AVB /CHB,dualchamberPPM implantation2/12/1998 due to [place] 2007,2014[employer] CRT-Ddue to EF 30% despite GDMT,multiple catheterizations / PCIs,PCI/BMSplacedSVG-OM 2015,known CTO of the SVG-RCA,HFrEF withrecovery of EF by most recent(55-60%,08/2019),lymphedema,AAA and aortic root dilation, remote strokes as seen on CT imaging, permanent Afib not on OACdue toGIB,s/p transfusion /known AVMs /falls, anemia,CKDIII, prostate CA, HTN, HLD, [facility]/ group B streptococcus withTEE without evidence of vegetations thoughID/EP recommendation forextractionof device.  Assessment & Plan    1.  Group B strep bacteremia S/p ICD system extraction  yesterday. Appears clinically stable today Continue IV antibiotics with Ceftriaxone x 4 weeks ID has been consulted.  Dr Purcell Nails note reviewed Per Dr [name], will need dental consult on Monday for teeth extraction (likely source for recurrent bacteremia)  Would place PICC line in LEFT arm given plans for reimplantation on the right side.  2. Acute on chronic diastolic dysfunction EF has recovered by echo this admission Continue IV diuresis over the weekend  3. Ischemic CM/ CAD s/p CABG No ischemic symptoms   4. Permanent afib No OAC therapy due to  prior GI bleeding chads2vasc score is at least 5  5. Hypertensive cardiovascular disease Stable No change required today  6. Acute on chronic blood loss anemia with iron deficiency Stable s/p extraction IM following  7. ICD system extraction As above Keep leg temp wire in place until I hear from Dr [name] (texted him this am).  I have reviewed temp/perm interrogation from this am and his threshold is probably adequate to have his leg temp wire removed if ok with Dr [name].  EF has recovered.  Hopefully, will not need ICD in the future, however given bradycardia, may likely need reimplantation of a biv pacemaker vs left bundle pacing system in 4-6 weeks.  Will keep neck temp perm in place in the interim.  Transfer to stepdown once temp wire is removed from his leg.  Hopefully later today.  Thompson Grayer MD, [facility] 08/26/2019 7:56 AM

## 2019-08-26 NOTE — Progress Notes (Signed)
    [facility] for Infectious Disease   Reason for visit: Follow up on bacteremia   Interval History: s/p removal of ICD.    Physical Exam: Constitutional:  Vitals:   08/26/19 0900 08/26/19 1000  BP: (!) 100/54 (!) 97/55  Pulse: 60 (!) 59  Resp: 13 (!) 21  Temp:  97.8 F (36.6 C)  SpO2: 99% 100%   patient appears in NAD  Impression/Plan: Blood cultures from 6/6 have remained negative, blood cultures repeated today.  I favor waiting until Monday for picc line placement with new blood cultures sent.  Will hold on placement for now.  Picc line in LEFT arm with reimplantation on right side.   Dental consult on Monday.   Dr. Megan Salon will follow up on Monday

## 2019-08-26 NOTE — Progress Notes (Signed)
PT Cancellation Note  Patient Details Name: Thomas [name] MRN: [id number] DOB: 08-May-1934   Cancelled Treatment:    Reason Eval/Treat Not Completed: Patient not medically ready;Patient's level of consciousness. Pt lethargic and difficult to arouse as well as having femoral sheath. Will check back when he is more appropriate.   Leighton Roach, PT  Acute Rehab Services  Pager [phone] Office [place] 08/26/2019, 2:14 PM

## 2019-08-26 NOTE — Progress Notes (Signed)
PICC order for OPAT 4 weeks ABT for bacteremia.  Blood cultures drawn this am.  Spoke with Leory Plowman RN re blood cultures.  No fever noted, has PIV access for current meds ordered.  Spoke with Dr Linus Salmons.  New order to d/c current PICC order. Please Reorder PICC once [facility] are negative x 48 hours.

## 2019-08-26 NOTE — Progress Notes (Signed)
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 At [facility] patient was followed by both Dr. [name] with ID and  Dr. [name] with Cardiology. TEE was performed on 6/9 with no evidence of vegetations. Despite this finding, infectious disease recommended transfer to [facility] for removal of ICD considering recurrent group B strep bacteremia. During patient's hospital course, patient also was found to be anemic s/p 2U PRBC. Dr. [name] with GI was consulted who performed small bowel enteroscopy revealing no evidence of significant pathology in the duodenum, jejunum, esophagus or stomach. Pt transferred for further management.    Today, pt noted to be lethargic/drowsy, although arousable. Attempted to communicate using sign language via interpreter, pt unable to stay awake.    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 (HCC)   Acute renal failure superimposed on stage 3a chronic kidney disease (HCC)   Sepsis due to group B  Streptococcus (HCC)   Duodenal arteriovenous malformation   Candidal intertrigo   Edema of left upper extremity   Pneumonia of left lower lobe due to group B Streptococcus (HCC)   Septic shock likely 2/2 recurrent Group B streptococcus bacteremia Unknown source, of note, very poor dentition (will need dental evaluation) Currently afebrile, with no leukocytosis BC X 2 done on 08/20/19 showed NGTD, repeat done on 08/26/19 pending TEE was performed on 6/9 with no evidence of vegetations ID on board, rec removal of AICD due to recurrence of bacteremia, plan for 4 weeks of IV rocephin after AICD removal with repeat BC, and waiting 2 days prior to placing PICC line (plan to be placed in LEFT arm) EP on board, s/p removal of AICD on 08/25/19, temp wire in place Continue Rocephin Outpt follow up with ID, Dr [name] Monitor closely  Left base PNA Currently on RA, afebrile Initial CXR with L base PNA, repeat with worsening congestion Continue IV Ceftriaxone Supplemental O2 as needed, duoneb prn, inhalers  Acute on chronic systolic HF CXR as above Recent TEE with EF of 55-60% EP/cardiology on board, rec IV lasix prn Plan to restart home torsemide, cozaar, aldactone once BP permits Strict I&O, daily weight  LUE edema USS negative for DVT in 06/2019, repeat showed acute superficial vein thrombosis Conservative management  Unable to receive AC due to GIB  Acute on chronic blood loss anemia with iron def Baseline hemoglobin between 7-8 S/P SB capsule endoscopy without any source of bleeding S/P IV iron, 2U PRBC Continue PPI, hold AC Daily CBC  Permanent Afib Rate controlled, paced rhythm Currently not on AC due  to GIB Telemetry  CAD/ CABG, DES/ HLD Chest pain free Continue statin  HTN BP soft Restart cozaar once able  CKD stage 3a Stable at baseline Daily BMP        Malnutrition Type:      Malnutrition Characteristics:      Nutrition Interventions:        Estimated body mass index is 27.54 kg/m as calculated from the following:   Height as of this encounter: 6\' 2"  (1.88 m).   Weight as of this encounter: 97.3 kg.     Code Status: Full  Family Communication: None  Disposition Plan: Status is: Inpatient  Remains inpatient appropriate because:Inpatient level of care appropriate due to severity of illness   Dispo: The patient is from: Home              Anticipated d/c is to: Home              Anticipated d/c date is: 3 days              Patient currently is not medically stable to d/c.    Consultants:  EP/Cardiology  ID  GI  Procedures:  Removal of AICD  SB enteroscopy  Antimicrobials: IV ceftriaxone  DVT prophylaxis: SCD   Objective: Vitals:   08/26/19 1534 08/26/19 1600 08/26/19 1700 08/26/19 1800  BP:      Pulse:  (!) 59 (!) 59 (!) 59  Resp:  13 17 18   Temp: 97.8 F (36.6 C) 97.8 F (36.6 C)  97.8 F (36.6 C)  TempSrc: Oral Oral  Oral  SpO2:  100% 99% 98%  Weight:      Height:        Intake/Output Summary (Last 24 hours) at 08/26/2019 1811 Last data filed at 08/26/2019 1800 Gross per 24 hour  Intake 1700.2 ml  Output 1000 ml  Net 700.2 ml   Filed Weights   08/24/19 1853 08/25/19 0326 08/26/19 0450  Weight: 96 kg 96.1 kg 97.3 kg    Exam:  General: NAD, deaf  Cardiovascular: S1, S2 present  Respiratory: Mild bibasilar crackles noted  Abdomen: Soft, nontender, nondistended, bowel sounds present  Musculoskeletal: 1+ bilateral pedal edema noted, chronic lymphedema, LUE edema  Skin: Rash noted around groin and buttocks  Psychiatry: Normal mood   Data Reviewed: CBC: Recent Labs  Lab 08/22/19 0320 08/23/19 0448 08/24/19 2100 08/25/19 0947 08/26/19 0201  WBC 7.1 6.3 6.2 5.2 6.8  NEUTROABS 5.2 4.2 4.4 3.5 6.0  HGB 8.8* 9.0* 8.6* 8.7* 8.3*  HCT 29.4* 28.8* 29.2* 29.3* 28.5*  MCV 80.8 79.3* 83.4 83.0 84.8  PLT 160 180 222 203 361   Basic Metabolic Panel: Recent Labs  Lab  08/21/19 0504 08/21/19 0504 08/22/19 0320 08/23/19 0448 08/24/19 2100 08/25/19 0947 08/26/19 0201  NA 141   < > 141 145 141 142 142  K 3.3*   < > 3.2* 3.5 3.5 3.6 4.0  CL 108   < > 107 111 108 108 108  CO2 28   < > 27 29 28 27 25   GLUCOSE 103*   < > 96 86 116* 99 124*  BUN 48*   < > 31* 20 12 11 12   CREATININE 1.42*   < > 1.07 0.88 1.06 0.99 1.13  CALCIUM 7.8*   < > 7.8* 7.6* 7.5* 7.5* 7.2*  MG 2.2  --  2.3 2.3 2.1  --  1.9  PHOS  --   --   --   --  2.3*  --   --    < > = values in this interval not displayed.   GFR: Estimated Creatinine Clearance: 58.6 mL/min (by C-G formula based on SCr of 1.13 mg/dL). Liver Function Tests: Recent Labs  Lab 08/24/19 2100  ALBUMIN 1.6*   No results for input(s): LIPASE, AMYLASE in the last 168 hours. No results for input(s): AMMONIA in the last 168 hours. Coagulation Profile: Recent Labs  Lab 08/24/19 2100  INR 1.3*   Cardiac Enzymes: No results for input(s): CKTOTAL, CKMB, CKMBINDEX, TROPONINI in the last 168 hours. BNP (last 3 results) No results for input(s): PROBNP in the last 8760 hours. HbA1C: No results for input(s): HGBA1C in the last 72 hours. CBG: Recent Labs  Lab 08/25/19 0518 08/26/19 0455  GLUCAP 93 114*   Lipid Profile: No results for input(s): CHOL, HDL, LDLCALC, TRIG, CHOLHDL, LDLDIRECT in the last 72 hours. Thyroid Function Tests: No results for input(s): TSH, T4TOTAL, FREET4, T3FREE, THYROIDAB in the last 72 hours. Anemia Panel: No results for input(s): VITAMINB12, FOLATE, FERRITIN, TIBC, IRON, RETICCTPCT in the last 72 hours. Urine analysis:    Component Value Date/Time   COLORURINE YELLOW (A) 08/20/2019 0924   APPEARANCEUR HAZY (A) 08/20/2019 0924   APPEARANCEUR Clear 12/13/2017 0918   LABSPEC 1.015 08/20/2019 0924   LABSPEC 1.021 11/19/2011 1501   PHURINE 5.0 08/20/2019 0924   GLUCOSEU NEGATIVE 08/20/2019 0924   GLUCOSEU Negative 11/19/2011 1501   HGBUR NEGATIVE 08/20/2019 0924   BILIRUBINUR  NEGATIVE 08/20/2019 0924   BILIRUBINUR Negative 12/13/2017 0918   BILIRUBINUR Negative 11/19/2011 1501   KETONESUR NEGATIVE 08/20/2019 0924   PROTEINUR NEGATIVE 08/20/2019 0924   NITRITE NEGATIVE 08/20/2019 0924   LEUKOCYTESUR NEGATIVE 08/20/2019 0924   LEUKOCYTESUR Negative 11/19/2011 1501   Sepsis Labs: @LABRCNTIP (procalcitonin:4,lacticidven:4)  ) Recent Results (from the past 240 hour(s))  Blood Culture (routine x 2)     Status: Abnormal   Collection Time: 08/18/19  2:34 PM   Specimen: BLOOD  Result Value Ref Range Status   Specimen Description   Final    BLOOD BLOOD RIGHT HAND Performed at [facility], [address], [place], [place] [address]    Special Requests   Final    BOTTLES DRAWN AEROBIC AND ANAEROBIC Blood Culture adequate volume Performed at [facility], [address] [place],  [address]    Culture  Setup Time   Final    GRAM POSITIVE COCCI IN BOTH AEROBIC AND ANAEROBIC BOTTLES CRITICAL RESULT CALLED TO, READ BACK BY AND VERIFIED WITH: [place] 08/19/19 AT 0050 HS Performed at [facility], [place] [address] [place], [place] [address]    Culture GROUP B STREP(S.AGALACTIAE)ISOLATED (A)  Final   Report Status 08/21/2019 FINAL  Final   Organism ID, Bacteria GROUP B STREP(S.AGALACTIAE)ISOLATED  Final      Susceptibility   Group b strep(s.agalactiae)isolated - MIC*    CLINDAMYCIN >=1 RESISTANT Resistant     AMPICILLIN <=0.25 SENSITIVE Sensitive     ERYTHROMYCIN >=8 RESISTANT Resistant     VANCOMYCIN 0.5 SENSITIVE Sensitive     CEFTRIAXONE <=0.12 SENSITIVE Sensitive     LEVOFLOXACIN 1 SENSITIVE Sensitive     PENICILLIN Value in next row Sensitive      SENSITIVE0.06    * GROUP B STREP(S.AGALACTIAE)ISOLATED  Blood Culture ID Panel (Reflexed)     Status: Abnormal   Collection Time: 08/18/19  2:34 PM  Result Value Ref Range Status   Enterococcus species NOT DETECTED NOT DETECTED  Final   Listeria monocytogenes NOT DETECTED  NOT DETECTED Final   Staphylococcus species NOT DETECTED NOT DETECTED Final   Staphylococcus aureus (BCID) NOT DETECTED NOT DETECTED Final   Streptococcus species DETECTED (A) NOT DETECTED Final    Comment: CRITICAL RESULT CALLED TO, READ BACK BY AND VERIFIED WITH: [place] BESANTI 08/19/19 AT 0050 HS    Streptococcus agalactiae DETECTED (A) NOT DETECTED Final    Comment: CRITICAL RESULT CALLED TO, READ BACK BY AND VERIFIED WITH: [place] BESANTI 08/19/19 AT 0050 HS    Streptococcus pneumoniae NOT DETECTED NOT DETECTED Final   Streptococcus pyogenes NOT DETECTED NOT DETECTED Final   Acinetobacter baumannii NOT DETECTED NOT DETECTED Final   Enterobacteriaceae species NOT DETECTED NOT DETECTED Final   Enterobacter cloacae complex NOT DETECTED NOT DETECTED Final   Escherichia coli NOT DETECTED NOT DETECTED Final   Klebsiella oxytoca NOT DETECTED NOT DETECTED Final   Klebsiella pneumoniae NOT DETECTED NOT DETECTED Final   Proteus species NOT DETECTED NOT DETECTED Final   Serratia marcescens NOT DETECTED NOT DETECTED Final   Haemophilus influenzae NOT DETECTED NOT DETECTED Final   Neisseria meningitidis NOT DETECTED NOT DETECTED Final   Pseudomonas aeruginosa NOT DETECTED NOT DETECTED Final   Candida albicans NOT DETECTED NOT DETECTED Final   Candida glabrata NOT DETECTED NOT DETECTED Final   Candida krusei NOT DETECTED NOT DETECTED Final   Candida parapsilosis NOT DETECTED NOT DETECTED Final   Candida tropicalis NOT DETECTED NOT DETECTED Final    Comment: Performed at [facility], [address] [place], [place] [address]  Blood Culture (routine x 2)     Status: Abnormal   Collection Time: 08/18/19  2:44 PM   Specimen: BLOOD  Result Value Ref Range Status   Specimen Description   Final    BLOOD RIGHT ANTECUBITAL Performed at [facility], [address] [place], [place] [address]    Special Requests   Final    BOTTLES DRAWN AEROBIC AND ANAEROBIC Blood Culture adequate  volume Performed at [facility], [address] [place], [place] [address]    Culture  Setup Time   Final    GRAM POSITIVE COCCI IN BOTH AEROBIC AND ANAEROBIC BOTTLES CRITICAL VALUE NOTED.  VALUE IS CONSISTENT WITH PREVIOUSLY REPORTED AND CALLED VALUE. Performed at [facility], [address] [place], [place] [address]    Culture (A)  Final    GROUP B STREP(S.AGALACTIAE)ISOLATED SUSCEPTIBILITIES PERFORMED ON PREVIOUS CULTURE WITHIN THE LAST 5 DAYS. Performed at [facility], [place] [address], [place], [place] [address]    Report Status 08/21/2019 FINAL  Final  SARS Coronavirus 2 by RT PCR (hospital order, performed in [facility] hospital lab) Nasopharyngeal Nasopharyngeal Swab     Status: None   Collection Time: 08/18/19  2:50 PM   Specimen: Nasopharyngeal Swab  Result Value Ref Range Status   SARS Coronavirus 2 NEGATIVE NEGATIVE Final    Comment: (NOTE) SARS-CoV-2 target nucleic acids are NOT DETECTED. The SARS-CoV-2 RNA is generally detectable in upper and lower respiratory specimens during the acute phase of infection. The lowest concentration of SARS-CoV-2 viral copies this assay can detect is 250 copies / mL. A negative result does not preclude SARS-CoV-2 infection and should not be used as the sole basis for treatment or other patient management decisions.  A negative result may occur with improper specimen collection / handling, submission of specimen other than nasopharyngeal swab, presence of viral mutation(s) within the areas targeted by this assay,  and inadequate number of viral copies (<250 copies / mL). A negative result must be combined with clinical observations, patient history, and epidemiological information. Fact Sheet for Patients:   StrictlyIdeas.no Fact Sheet for Healthcare Providers: BankingDealers.co.za This test is not yet approved or cleared  by the [place] FDA and has been  authorized for detection and/or diagnosis of SARS-CoV-2 by FDA under an Emergency Use Authorization (EUA).  This EUA will remain in effect (meaning this test can be used) for the duration of the COVID-19 declaration under Section 564(b)(1) of the Act, 21 U.S.C. section 360bbb-3(b)(1), unless the authorization is terminated or revoked sooner. Performed at [facility], [address] [place], [place] [address]   Culture, sputum-assessment     Status: None   Collection Time: 08/19/19  1:11 AM   Specimen: Sputum  Result Value Ref Range Status   Specimen Description SPUTUM  Final   Special Requests NONE  Final   Sputum evaluation   Final    Sputum specimen not acceptable for testing.  Please recollect.   C/CHARLIE FLEETWOOD AT 0865 08/19/19.PMF Performed at [facility], [address] [place], [place] [address]    Report Status 08/19/2019 FINAL  Final  MRSA PCR Screening     Status: None   Collection Time: 08/19/19  1:30 AM   Specimen: Nasal Mucosa; Nasopharyngeal  Result Value Ref Range Status   MRSA by PCR NEGATIVE NEGATIVE Final    Comment:        The GeneXpert MRSA Assay (FDA approved for NASAL specimens only), is one component of a comprehensive MRSA colonization surveillance program. It is not intended to diagnose MRSA infection nor to guide or monitor treatment for MRSA infections. Performed at [facility], [address] [place], [place] [address]   Gastrointestinal Panel by PCR , Stool     Status: None   Collection Time: 08/19/19 12:56 PM   Specimen: Stool  Result Value Ref Range Status   Campylobacter species NOT DETECTED NOT DETECTED Final   Plesimonas shigelloides NOT DETECTED NOT DETECTED Final   Salmonella species NOT DETECTED NOT DETECTED Final   Yersinia enterocolitica NOT DETECTED NOT DETECTED Final   Vibrio species NOT DETECTED NOT DETECTED Final   Vibrio cholerae NOT DETECTED NOT DETECTED Final   Enteroaggregative E coli  (EAEC) NOT DETECTED NOT DETECTED Final   Enteropathogenic E coli (EPEC) NOT DETECTED NOT DETECTED Final   Enterotoxigenic E coli (ETEC) NOT DETECTED NOT DETECTED Final   Shiga like toxin producing E coli (STEC) NOT DETECTED NOT DETECTED Final   Shigella/Enteroinvasive E coli (EIEC) NOT DETECTED NOT DETECTED Final   Cryptosporidium NOT DETECTED NOT DETECTED Final   Cyclospora cayetanensis NOT DETECTED NOT DETECTED Final   Entamoeba histolytica NOT DETECTED NOT DETECTED Final   Giardia lamblia NOT DETECTED NOT DETECTED Final   Adenovirus F40/41 NOT DETECTED NOT DETECTED Final   Astrovirus NOT DETECTED NOT DETECTED Final   Norovirus GI/GII NOT DETECTED NOT DETECTED Final   Rotavirus A NOT DETECTED NOT DETECTED Final   Sapovirus (I, II, IV, and V) NOT DETECTED NOT DETECTED Final    Comment: Performed at [facility], [address], [place], [place] [address]  Urine culture     Status: None   Collection Time: 08/20/19  9:24 AM   Specimen: Urine  Result Value Ref Range Status   Specimen Description   Final    IN/OUT CATH URINE Performed at [facility], [address] [place],  [place] [address]    Special Requests   Final    NONE Performed at [facility], [address] [place], [place] [address]    Culture   Final    NO GROWTH Performed at [facility], [place] [address] [place], [place] [address]    Report Status 08/21/2019 FINAL  Final  C Difficile Quick Screen w PCR reflex     Status: None   Collection Time: 08/20/19 12:01 PM   Specimen: STOOL  Result Value Ref Range Status   C Diff antigen NEGATIVE NEGATIVE Final   C Diff toxin NEGATIVE NEGATIVE Final   C Diff interpretation No C. difficile detected.  Final    Comment: Performed at [facility], [address] [place], [place] [address]  CULTURE, BLOOD (ROUTINE X 2) w Reflex to ID Panel     Status: None   Collection Time: 08/20/19  1:07 PM   Specimen: BLOOD  Result Value Ref  Range Status   Specimen Description BLOOD [facility]  Final   Special Requests   Final    BOTTLES DRAWN AEROBIC AND ANAEROBIC Blood Culture adequate volume   Culture   Final    NO GROWTH 5 DAYS Performed at [facility], [address] [place], [place] [address]    Report Status 08/25/2019 FINAL  Final  CULTURE, BLOOD (ROUTINE X 2) w Reflex to ID Panel     Status: None   Collection Time: 08/20/19  1:22 PM   Specimen: BLOOD  Result Value Ref Range Status   Specimen Description BLOOD [facility]  Final   Special Requests   Final    BOTTLES DRAWN AEROBIC AND ANAEROBIC Blood Culture adequate volume   Culture   Final    NO GROWTH 5 DAYS Performed at [facility], [address], [place], [place] [address]    Report Status 08/25/2019 FINAL  Final  Surgical PCR screen     Status: None   Collection Time: 08/25/19 11:53 AM   Specimen: Nasal Mucosa; Nasal Swab  Result Value Ref Range Status   MRSA, PCR NEGATIVE NEGATIVE Final   Staphylococcus aureus NEGATIVE NEGATIVE Final    Comment: (NOTE) The Xpert SA Assay (FDA approved for NASAL specimens in patients 41 years of age and older), is one component of a comprehensive surveillance program. It is not intended to diagnose infection nor to guide or monitor treatment. Performed at [facility], [place] [address] [place], [place] [address]       Studies: DG Chest 1 View  Result Date: 08/25/2019 CLINICAL DATA:  Pacemaker lead removal EXAM: DG C-ARM 1-60 MIN; CHEST  1 VIEW FLUOROSCOPY TIME:  Fluoroscopy Time: 27 minutes 15 seconds 716.09 mGy Number of Acquired Spot Images: 1 COMPARISON:  Chest radiograph August 24, 2019 FINDINGS: Frontal spot image over the left lower chest shows stable pacemaker leads attached to the right heart and coronary sinus region. Visualized heart appears enlarged. IMPRESSION: Stable appearing lead placement on frontal view.  Cardiomegaly. Electronically Signed   By: Lowella Grip III M.D.   On:  08/25/2019 19:15   DG CHEST PORT 1 VIEW  Result Date: 08/26/2019 CLINICAL DATA:  Congestive heart failure EXAM: PORTABLE CHEST 1 VIEW COMPARISON:  August 24, 2019 FINDINGS: There is cardiomegaly with pulmonary vascularity normal. Pacemaker leads attached to right atrium and coronary sinus. Postoperative changes noted. There is aortic atherosclerosis. There are pleural effusions bilaterally. There is airspace opacity throughout the left lower lobe. There is  ill-defined airspace opacity in the left mid and lower lung regions as well. No adenopathy appreciable.  No bone lesions. IMPRESSION: Cardiomegaly with postoperative changes. Pleural effusions bilaterally with areas of airspace opacity in the mid and lower lung regions, more severe on the left than on the right, similar to 2 days prior. Aortic Atherosclerosis (ICD10-I70.0). Electronically Signed   By: Lowella Grip III M.D.   On: 08/26/2019 10:31   DG C-Arm 1-60 Min  Result Date: 08/25/2019 CLINICAL DATA:  Pacemaker lead removal EXAM: DG C-ARM 1-60 MIN; CHEST  1 VIEW FLUOROSCOPY TIME:  Fluoroscopy Time: 27 minutes 15 seconds 716.09 mGy Number of Acquired Spot Images: 1 COMPARISON:  Chest radiograph August 24, 2019 FINDINGS: Frontal spot image over the left lower chest shows stable pacemaker leads attached to the right heart and coronary sinus region. Visualized heart appears enlarged. IMPRESSION: Stable appearing lead placement on frontal view.  Cardiomegaly. Electronically Signed   By: Lowella Grip III M.D.   On: 08/25/2019 19:15   VAS [place] UPPER EXTREMITY VENOUS DUPLEX  Result Date: 08/26/2019 UPPER VENOUS STUDY  Indications: Swelling Performing Technologist: June Leap RDMS, RVT  Examination Guidelines: A complete evaluation includes B-mode imaging, spectral Doppler, color Doppler, and power Doppler as needed of all accessible portions of each vessel. Bilateral testing is considered an integral part of a complete examination. Limited  examinations for reoccurring indications may be performed as noted.  Right Findings: +-----+------------+---------+-----------+----------+--------------+ RIGHTCompressiblePhasicitySpontaneousProperties   Summary     +-----+------------+---------+-----------+----------+--------------+ IJV                                            Not visualized +-----+------------+---------+-----------+----------+--------------+  Left Findings: +----------+------------+---------+-----------+----------+--------------------+ LEFT      CompressiblePhasicitySpontaneousProperties      Summary        +----------+------------+---------+-----------+----------+--------------------+ IJV           Full       Yes       Yes                                   +----------+------------+---------+-----------+----------+--------------------+ Subclavian                                             Not visualized    +----------+------------+---------+-----------+----------+--------------------+ Axillary      Full       Yes       Yes                                   +----------+------------+---------+-----------+----------+--------------------+ Brachial      Full       Yes       Yes                                   +----------+------------+---------+-----------+----------+--------------------+ Radial        Full                                                       +----------+------------+---------+-----------+----------+--------------------+  Ulnar         Full                                                       +----------+------------+---------+-----------+----------+--------------------+ Cephalic      None                                     Acute thrombus                                                              forearm        +----------+------------+---------+-----------+----------+--------------------+ Basilic       Full                                                        +----------+------------+---------+-----------+----------+--------------------+  Summary:  Left: No evidence of deep vein thrombosis in the upper extremity. Findings consistent with acute superficial vein thrombosis involving the left cephalic vein.  *See table(s) above for measurements and observations.  Diagnosing physician: Monica Martinez MD Electronically signed by Monica Martinez MD on 08/26/2019 at 12:49:19 PM.    Final     Scheduled Meds: . sodium chloride   Intravenous Once  . Chlorhexidine Gluconate Cloth  6 each Topical Daily  . ferrous sulfate  325 mg Oral Daily  . Gerhardt's butt cream   Topical BID  . multivitamin with minerals  1 tablet Oral Daily  . pantoprazole  40 mg Intravenous Q12H  . simvastatin  20 mg Oral q1800  . sodium chloride flush  3 mL Intravenous Q12H    Continuous Infusions: . cefTRIAXone (ROCEPHIN) IVPB 2 gram/100 mL NS (Mini-Bag Plus) Stopped (08/26/19 0025)     LOS: 2 days     Alma Friendly, MD [facility]  If 7PM-7AM, please contact night-coverage [email or web address] 08/26/2019, 6:11 PM

## 2019-08-27 ENCOUNTER — Inpatient Hospital Stay: Payer: Self-pay

## 2019-08-27 DIAGNOSIS — I10 Essential (primary) hypertension: Secondary | ICD-10-CM

## 2019-08-27 DIAGNOSIS — I25708 Atherosclerosis of coronary artery bypass graft(s), unspecified, with other forms of angina pectoris: Secondary | ICD-10-CM

## 2019-08-27 LAB — BASIC METABOLIC PANEL
Anion gap: 7 (ref 5–15)
BUN: 20 mg/dL (ref 8–23)
CO2: 26 mmol/L (ref 22–32)
Calcium: 7.2 mg/dL — ABNORMAL LOW (ref 8.9–10.3)
Chloride: 107 mmol/L (ref 98–111)
Creatinine, Ser: 1.32 mg/dL — ABNORMAL HIGH (ref 0.61–1.24)
GFR calc Af Amer: 58 mL/min — ABNORMAL LOW (ref >60)
GFR calc non Af Amer: 50 mL/min — ABNORMAL LOW (ref >60)
Glucose, Bld: 100 mg/dL — ABNORMAL HIGH (ref 70–99)
Potassium: 4.3 mmol/L (ref 3.5–5.1)
Sodium: 140 mmol/L (ref 135–145)

## 2019-08-27 LAB — CBC WITH DIFFERENTIAL/PLATELET
Abs Immature Granulocytes: 0.05 10*3/uL (ref 0.00–0.07)
Basophils Absolute: 0 10*3/uL (ref 0.0–0.1)
Basophils Relative: 0 %
Eosinophils Absolute: 0.2 10*3/uL (ref 0.0–0.5)
Eosinophils Relative: 2 %
HCT: 28.4 % — ABNORMAL LOW (ref 39.0–52.0)
Hemoglobin: 8.1 g/dL — ABNORMAL LOW (ref 13.0–17.0)
Immature Granulocytes: 1 %
Lymphocytes Relative: 13 %
Lymphs Abs: 1 10*3/uL (ref 0.7–4.0)
MCH: 24.6 pg — ABNORMAL LOW (ref 26.0–34.0)
MCHC: 28.5 g/dL — ABNORMAL LOW (ref 30.0–36.0)
MCV: 86.3 fL (ref 80.0–100.0)
Monocytes Absolute: 0.7 10*3/uL (ref 0.1–1.0)
Monocytes Relative: 9 %
Neutro Abs: 5.9 10*3/uL (ref 1.7–7.7)
Neutrophils Relative %: 75 %
Platelets: 220 10*3/uL (ref 150–400)
RBC: 3.29 MIL/uL — ABNORMAL LOW (ref 4.22–5.81)
RDW: 19.9 % — ABNORMAL HIGH (ref 11.5–15.5)
WBC: 7.8 10*3/uL (ref 4.0–10.5)
nRBC: 0 % (ref 0.0–0.2)

## 2019-08-27 MED ORDER — FUROSEMIDE 10 MG/ML IJ SOLN
40.0000 mg | Freq: Once | INTRAMUSCULAR | Status: AC
  Administered 2019-08-27: 40 mg via INTRAVENOUS
  Filled 2019-08-27: qty 4

## 2019-08-27 NOTE — Progress Notes (Signed)
°    [facility]   Reason for visit: Follow up on bacteremia  Interval History: repeat blood cultures remain ngtd. No new issues.  Physical Exam: Constitutional:  Vitals:   08/27/19 1100 08/27/19 1122  BP:    Pulse: 60   Resp: (!) 21   Temp:  (!) 97.4 F (36.3 C)  SpO2: 100%    Impression: bacteremia. S/P extraction of ICD.   Plan: 1.  Continue with ceftriaxone through 09/22/19.  PICC line order placed for tomorrow for left arm placement OPAT orders in IV HH orders under consult He will follow up with ID, Dr. [name] at [facility]  I will sign off, call with any questions

## 2019-08-27 NOTE — Progress Notes (Signed)
Progress Note   Subjective   Doing well today, the patient denies CP or SOB.  No new concerns  Inpatient Medications    Scheduled Meds: . sodium chloride   Intravenous Once  . Chlorhexidine Gluconate Cloth  6 each Topical Daily  . ferrous sulfate  325 mg Oral Daily  . Gerhardt's butt cream   Topical BID  . multivitamin with minerals  1 tablet Oral Daily  . pantoprazole  40 mg Intravenous Q12H  . simvastatin  20 mg Oral q1800  . sodium chloride flush  3 mL Intravenous Q12H   Continuous Infusions: . cefTRIAXone (ROCEPHIN) IVPB 2 gram/100 mL NS (Mini-Bag Plus) 2 g (08/27/19 0003)   PRN Meds: acetaminophen, acetaminophen, albuterol, dextromethorphan-guaiFENesin, ondansetron (ZOFRAN) IV   Vital Signs    Vitals:   08/27/19 0400 08/27/19 0601 08/27/19 0800 08/27/19 0900  BP: (!) 96/53 (!) 106/53 (!) 100/57   Pulse: 60 (!) 59 60 (!) 59  Resp: 14 16 (!) 23 16  Temp: 97.9 F (36.6 C)  97.9 F (36.6 C)   TempSrc: Oral  Oral   SpO2: 98% 99% 95% 99%  Weight:      Height:        Intake/Output Summary (Last 24 hours) at 08/27/2019 0918 Last data filed at 08/27/2019 0900 Gross per 24 hour  Intake 320 ml  Output 600 ml  Net -280 ml   Filed Weights   08/24/19 1853 08/25/19 0326 08/26/19 0450  Weight: 96 kg 96.1 kg 97.3 kg    Telemetry    Afib, V paced, rare PVCs/ short NSVT - Personally Reviewed  Physical Exam   GEN- The patient is well appearing, aler  Head- normocephalic, atraumatic Eyes-  Sclera clear, conjunctiva pink Ears- hearing intact Oropharynx- clear with poor dentition Neck- supple, Lungs-  normal work of breathing Heart- Regular rate and rhythm  GI- soft  Extremities- no clubbing, cyanosis, or edema  MS- no significant deformity or atrophy Skin- no rash or lesion Psych- euthymic mood, full affect Neuro- strength and sensation are intact   Labs    Chemistry Recent Labs  Lab 08/24/19 2100 08/24/19 2100 08/25/19 0947 08/26/19 0201  08/27/19 0505  NA 141   < > 142 142 140  K 3.5   < > 3.6 4.0 4.3  CL 108   < > 108 108 107  CO2 28   < > 27 25 26   GLUCOSE 116*   < > 99 124* 100*  BUN 12   < > 11 12 20   CREATININE 1.06   < > 0.99 1.13 1.32*  CALCIUM 7.5*   < > 7.5* 7.2* 7.2*  ALBUMIN 1.6*  --   --   --   --   GFRNONAA >60   < > >60 >60 50*  GFRAA >60   < > >60 >60 58*  ANIONGAP 5   < > 7 9 7    < > = values in this interval not displayed.     Hematology Recent Labs  Lab 08/25/19 0947 08/26/19 0201 08/27/19 0505  WBC 5.2 6.8 7.8  RBC 3.53* 3.36* 3.29*  HGB 8.7* 8.3* 8.1*  HCT 29.3* 28.5* 28.4*  MCV 83.0 84.8 86.3  MCH 24.6* 24.7* 24.6*  MCHC 29.7* 29.1* 28.5*  RDW 19.9* 19.9* 19.9*  PLT 203 192 220     Patient ID    Thomas [name] a 85 y.o.malewith history ofcongenitaldeafness,CAD s/p1986CABGas below, 2000 inferior STEMI with subsequent VT/ 2nd degree AVB /CHB,dualchamberPPM  implantation2/12/1998 due to [place] 2007,2014[employer] CRT-Ddue to EF 30% despite GDMT,multiple catheterizations / PCIs,PCI/BMSplacedSVG-OM 2015,known CTO of the SVG-RCA,HFrEF withrecovery of EF by most recent(55-60%,08/2019),lymphedema,AAA and aortic root dilation, remote strokes as seen on CT imaging, permanent Afib not on OACdue toGIB,s/p transfusion /known AVMs /falls, anemia,CKDIII, prostate CA, HTN, HLD, [facility]/ group B streptococcus withTEE without evidence of vegetations thoughID/EP recommendation forextractionof device.  Assessment & Plan    1.  Group B strep bacteremia S/p ICD system extraction on Friday Temp perm in place.   I spoke with Dr [name] yesterday who would prefer to keep groin temp wire in place until he can reassess on Monday am. I will order portable CXR for tomorrow am. Per ID, ceftriaxone x 4 weeks. Will obtain L arm PICC.  2. Poor dentition Likely source for his recurrent bacteremia Dr  [name] plans to consult dental service tomorrow  3. Ischemic CM/ s/p CABG No ischemic symptoms EF has recovered with CRT Will not plan to place another ICD but hopefully CRT vs left bundle pacing after he has recovered  4. Permanent afib Not on anticoagulation due to prior GI bleeding  5. HTN Stable No change required today  6. Anemia Stable No change required today  Will hold off on subQ heparin today given anemia but will need to consider prophylaxis if not out of bed by tomorrow.  Thompson Grayer MD, [facility] 08/27/2019 9:18 AM

## 2019-08-27 NOTE — Progress Notes (Signed)
SLP Cancellation Note  Patient Details Name: Thomas [name] MRN: [id number] DOB: 18-May-1934   Cancelled treatment:       Reason Eval/Treat Not Completed: SLP screened, no needs identified, will sign off Pt was followed by SLP at [facility] and no further SLP needs were identified when pt was last seen on 08/22/19. RN indicated that the pt has been tolerating the current diet without symptoms of oropharyngeal dysphagia and imaging is not suspicious for silent aspiration. Please re-consult as clinically indicated.  Louise Victory I. Hardin Negus, [place], [place] Office number [phone] Pager [place] 08/27/2019, 10:38 AM

## 2019-08-27 NOTE — Progress Notes (Signed)
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 At [facility] patient was followed by both Dr. [name] with ID and  Dr. [name] with Cardiology. TEE was performed on 6/9 with no evidence of vegetations. Despite this finding, infectious disease recommended transfer to [facility] for removal of ICD considering recurrent group B strep bacteremia. During patient's hospital course, patient also was found to be anemic s/p 2U PRBC. Dr. [name] with GI was consulted who performed small bowel enteroscopy revealing no evidence of significant pathology in the duodenum, jejunum, esophagus or stomach. Pt transferred for further management.    Today, pt reported feeling much better, more awake, denies any new complaints, no chest pain, SOB, abdominal pain, fever/chills.    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 (HCC)   Acute renal failure superimposed on stage 3a chronic kidney disease (HCC)   Sepsis due to group B Streptococcus (HCC)    Duodenal arteriovenous malformation   Candidal intertrigo   Edema of left upper extremity   Pneumonia of left lower lobe due to group B Streptococcus (HCC)   Septic shock likely 2/2 recurrent Group B streptococcus bacteremia Unknown source, of note, very poor dentition (will need dental evaluation) Currently afebrile, with no leukocytosis BC X 2 done on 08/20/19 showed NGTD, repeat done on 08/26/19 after pacemaker removal NGTD TEE was performed on 6/9 with no evidence of vegetations ID on board, rec IV ceftriaxone through 09/22/19 PICC line (plan to be placed in LEFT arm) on 08/28/19 EP on board, s/p removal of AICD on 08/25/19, temp wire in place Dental evaluation on 08/28/19 Continue Rocephin Outpt follow up with ID, Dr [name] Monitor closely  Left base PNA Currently on RA, afebrile Initial CXR with L base PNA, repeat with worsening congestion Continue IV Ceftriaxone Supplemental O2 as needed, duoneb prn, inhalers  Acute on chronic systolic HF CXR as above Recent TEE with EF of 55-60% EP/cardiology on board, rec IV lasix prn Plan to restart home torsemide, cozaar, aldactone once BP permits Strict I&O, daily weight  LUE edema USS negative for DVT in 06/2019, repeat showed acute superficial vein thrombosis Conservative management  Unable to receive AC due to GIB  Acute on chronic blood loss anemia with iron def Baseline hemoglobin between 7-8 S/P SB capsule endoscopy without any source of bleeding S/P IV iron, 2U PRBC Continue PPI, hold AC Daily CBC  Permanent Afib Rate controlled, paced rhythm Currently not on AC due to GIB Telemetry  CAD/ CABG, DES/ HLD Chest pain free Continue statin  HTN BP soft  Restart cozaar once able  CKD stage 3a Some mild AKI Stable at baseline Daily BMP        Malnutrition Type:      Malnutrition Characteristics:      Nutrition Interventions:       Estimated body mass index is 27.54 kg/m as calculated from the  following:   Height as of this encounter: 6\' 2"  (1.88 m).   Weight as of this encounter: 97.3 kg.     Code Status: Full  Family Communication: None  Disposition Plan: Status is: Inpatient  Remains inpatient appropriate because:Inpatient level of care appropriate due to severity of illness   Dispo: The patient is from: Home              Anticipated d/c is to: Home              Anticipated d/c date is: 3 days              Patient currently is not medically stable to d/c.    Consultants:  EP/Cardiology  ID  GI  Procedures:  Removal of AICD  SB enteroscopy  Antimicrobials: IV ceftriaxone  DVT prophylaxis: SCD   Objective: Vitals:   08/27/19 1300 08/27/19 1400 08/27/19 1500 08/27/19 1550  BP:  (!) 114/56    Pulse: 60 (!) 58 (!) 59   Resp: (!) 21 (!) 21 (!) 21   Temp:  97.6 F (36.4 C)  97.6 F (36.4 C)  TempSrc:  Oral    SpO2: 100% 96% 97%   Weight:      Height:        Intake/Output Summary (Last 24 hours) at 08/27/2019 1626 Last data filed at 08/27/2019 1500 Gross per 24 hour  Intake 440 ml  Output 800 ml  Net -360 ml   Filed Weights   08/24/19 1853 08/25/19 0326 08/26/19 0450  Weight: 96 kg 96.1 kg 97.3 kg    Exam:  General: NAD, deaf  Cardiovascular: S1, S2 present  Respiratory: Mild bibasilar crackles noted  Abdomen: Soft, nontender, nondistended, bowel sounds present  Musculoskeletal: 1+ bilateral pedal edema noted, chronic lymphedema, LUE edema  Skin: Rash noted around groin and buttocks  Psychiatry: Normal mood   Data Reviewed: CBC: Recent Labs  Lab 08/23/19 0448 08/24/19 2100 08/25/19 0947 08/26/19 0201 08/27/19 0505  WBC 6.3 6.2 5.2 6.8 7.8  NEUTROABS 4.2 4.4 3.5 6.0 5.9  HGB 9.0* 8.6* 8.7* 8.3* 8.1*  HCT 28.8* 29.2* 29.3* 28.5* 28.4*  MCV 79.3* 83.4 83.0 84.8 86.3  PLT 180 222 203 192 009   Basic Metabolic Panel: Recent Labs  Lab 08/21/19 0504 08/21/19 0504 08/22/19 0320 08/22/19 0320 08/23/19 0448  08/24/19 2100 08/25/19 0947 08/26/19 0201 08/27/19 0505  NA 141   < > 141   < > 145 141 142 142 140  K 3.3*   < > 3.2*   < > 3.5 3.5 3.6 4.0 4.3  CL 108   < > 107   < > 111 108 108 108 107  CO2 28   < > 27   < > 29 28 27 25 26   GLUCOSE 103*   < > 96   < > 86 116* 99 124* 100*  BUN 48*   < > 31*   < > 20 12 11 12 20   CREATININE 1.42*   < > 1.07   < > 0.88 1.06 0.99 1.13 1.32*  CALCIUM 7.8*   < > 7.8*   < > 7.6*  7.5* 7.5* 7.2* 7.2*  MG 2.2  --  2.3  --  2.3 2.1  --  1.9  --   PHOS  --   --   --   --   --  2.3*  --   --   --    < > = values in this interval not displayed.   GFR: Estimated Creatinine Clearance: 50.2 mL/min (A) (by C-G formula based on SCr of 1.32 mg/dL (H)). Liver Function Tests: Recent Labs  Lab 08/24/19 2100  ALBUMIN 1.6*   No results for input(s): LIPASE, AMYLASE in the last 168 hours. No results for input(s): AMMONIA in the last 168 hours. Coagulation Profile: Recent Labs  Lab 08/24/19 2100  INR 1.3*   Cardiac Enzymes: No results for input(s): CKTOTAL, CKMB, CKMBINDEX, TROPONINI in the last 168 hours. BNP (last 3 results) No results for input(s): PROBNP in the last 8760 hours. HbA1C: No results for input(s): HGBA1C in the last 72 hours. CBG: Recent Labs  Lab 08/25/19 0518 08/26/19 0455  GLUCAP 93 114*   Lipid Profile: No results for input(s): CHOL, HDL, LDLCALC, TRIG, CHOLHDL, LDLDIRECT in the last 72 hours. Thyroid Function Tests: No results for input(s): TSH, T4TOTAL, FREET4, T3FREE, THYROIDAB in the last 72 hours. Anemia Panel: No results for input(s): VITAMINB12, FOLATE, FERRITIN, TIBC, IRON, RETICCTPCT in the last 72 hours. Urine analysis:    Component Value Date/Time   COLORURINE YELLOW (A) 08/20/2019 0924   APPEARANCEUR HAZY (A) 08/20/2019 0924   APPEARANCEUR Clear 12/13/2017 0918   LABSPEC 1.015 08/20/2019 0924   LABSPEC 1.021 11/19/2011 1501   PHURINE 5.0 08/20/2019 0924   GLUCOSEU NEGATIVE 08/20/2019 0924   GLUCOSEU Negative  11/19/2011 1501   HGBUR NEGATIVE 08/20/2019 0924   BILIRUBINUR NEGATIVE 08/20/2019 0924   BILIRUBINUR Negative 12/13/2017 0918   BILIRUBINUR Negative 11/19/2011 1501   KETONESUR NEGATIVE 08/20/2019 0924   PROTEINUR NEGATIVE 08/20/2019 0924   NITRITE NEGATIVE 08/20/2019 0924   LEUKOCYTESUR NEGATIVE 08/20/2019 0924   LEUKOCYTESUR Negative 11/19/2011 1501   Sepsis Labs: @LABRCNTIP (procalcitonin:4,lacticidven:4)  ) Recent Results (from the past 240 hour(s))  Blood Culture (routine x 2)     Status: Abnormal   Collection Time: 08/18/19  2:34 PM   Specimen: BLOOD  Result Value Ref Range Status   Specimen Description   Final    BLOOD BLOOD RIGHT HAND Performed at [facility], [address], [place], [place] [address]    Special Requests   Final    BOTTLES DRAWN AEROBIC AND ANAEROBIC Blood Culture adequate volume Performed at [facility], [address] [place], [place] [address]    Culture  Setup Time   Final    GRAM POSITIVE COCCI IN BOTH AEROBIC AND ANAEROBIC BOTTLES CRITICAL RESULT CALLED TO, READ BACK BY AND VERIFIED WITH: [place] 08/19/19 AT 0050 HS Performed at [facility], [place] [address], [place], [place] [address]    Culture GROUP B STREP(S.AGALACTIAE)ISOLATED (A)  Final   Report Status 08/21/2019 FINAL  Final   Organism ID, Bacteria GROUP B STREP(S.AGALACTIAE)ISOLATED  Final      Susceptibility   Group b strep(s.agalactiae)isolated - MIC*    CLINDAMYCIN >=1 RESISTANT Resistant     AMPICILLIN <=0.25 SENSITIVE Sensitive     ERYTHROMYCIN >=8 RESISTANT Resistant     VANCOMYCIN 0.5 SENSITIVE Sensitive     CEFTRIAXONE <=0.12 SENSITIVE Sensitive     LEVOFLOXACIN 1 SENSITIVE Sensitive     PENICILLIN Value in next row Sensitive  SENSITIVE0.06    * GROUP B STREP(S.AGALACTIAE)ISOLATED  Blood Culture ID Panel (Reflexed)     Status: Abnormal   Collection Time: 08/18/19  2:34 PM  Result Value Ref Range Status   Enterococcus species NOT  DETECTED NOT DETECTED Final   Listeria monocytogenes NOT DETECTED NOT DETECTED Final   Staphylococcus species NOT DETECTED NOT DETECTED Final   Staphylococcus aureus (BCID) NOT DETECTED NOT DETECTED Final   Streptococcus species DETECTED (A) NOT DETECTED Final    Comment: CRITICAL RESULT CALLED TO, READ BACK BY AND VERIFIED WITH: DAVID BESANTI 08/19/19 AT 0050 HS    Streptococcus agalactiae DETECTED (A) NOT DETECTED Final    Comment: CRITICAL RESULT CALLED TO, READ BACK BY AND VERIFIED WITH: DAVID BESANTI 08/19/19 AT 0050 HS    Streptococcus pneumoniae NOT DETECTED NOT DETECTED Final   Streptococcus pyogenes NOT DETECTED NOT DETECTED Final   Acinetobacter baumannii NOT DETECTED NOT DETECTED Final   Enterobacteriaceae species NOT DETECTED NOT DETECTED Final   Enterobacter cloacae complex NOT DETECTED NOT DETECTED Final   Escherichia coli NOT DETECTED NOT DETECTED Final   Klebsiella oxytoca NOT DETECTED NOT DETECTED Final   Klebsiella pneumoniae NOT DETECTED NOT DETECTED Final   Proteus species NOT DETECTED NOT DETECTED Final   Serratia marcescens NOT DETECTED NOT DETECTED Final   Haemophilus influenzae NOT DETECTED NOT DETECTED Final   Neisseria meningitidis NOT DETECTED NOT DETECTED Final   Pseudomonas aeruginosa NOT DETECTED NOT DETECTED Final   Candida albicans NOT DETECTED NOT DETECTED Final   Candida glabrata NOT DETECTED NOT DETECTED Final   Candida krusei NOT DETECTED NOT DETECTED Final   Candida parapsilosis NOT DETECTED NOT DETECTED Final   Candida tropicalis NOT DETECTED NOT DETECTED Final    Comment: Performed at [facility], [address] [place], [place] [address]  Blood Culture (routine x 2)     Status: Abnormal   Collection Time: 08/18/19  2:44 PM   Specimen: BLOOD  Result Value Ref Range Status   Specimen Description   Final    BLOOD RIGHT ANTECUBITAL Performed at [facility], [address], [place], [place] [address]    Special Requests    Final    BOTTLES DRAWN AEROBIC AND ANAEROBIC Blood Culture adequate volume Performed at [facility], [address] [place], [place] [address]    Culture  Setup Time   Final    GRAM POSITIVE COCCI IN BOTH AEROBIC AND ANAEROBIC BOTTLES CRITICAL VALUE NOTED.  VALUE IS CONSISTENT WITH PREVIOUSLY REPORTED AND CALLED VALUE. Performed at [facility], [address] [place], [place] [address]    Culture (A)  Final    GROUP B STREP(S.AGALACTIAE)ISOLATED SUSCEPTIBILITIES PERFORMED ON PREVIOUS CULTURE WITHIN THE LAST 5 DAYS. Performed at [facility], [place] [address] [place], [place] [address]    Report Status 08/21/2019 FINAL  Final  SARS Coronavirus 2 by RT PCR (hospital order, performed in [facility] hospital lab) Nasopharyngeal Nasopharyngeal Swab     Status: None   Collection Time: 08/18/19  2:50 PM   Specimen: Nasopharyngeal Swab  Result Value Ref Range Status   SARS Coronavirus 2 NEGATIVE NEGATIVE Final    Comment: (NOTE) SARS-CoV-2 target nucleic acids are NOT DETECTED. The SARS-CoV-2 RNA is generally detectable in upper and lower respiratory specimens during the acute phase of infection. The lowest concentration of SARS-CoV-2 viral copies this assay can detect is 250 copies / mL. A negative result does not preclude SARS-CoV-2 infection and should not be  used as the sole basis for treatment or other patient management decisions.  A negative result may occur with improper specimen collection / handling, submission of specimen other than nasopharyngeal swab, presence of viral mutation(s) within the areas targeted by this assay, and inadequate number of viral copies (<250 copies / mL). A negative result must be combined with clinical observations, patient history, and epidemiological information. Fact Sheet for Patients:   StrictlyIdeas.no Fact Sheet for Healthcare Providers: BankingDealers.co.za This test  is not yet approved or cleared  by the [place] FDA and has been authorized for detection and/or diagnosis of SARS-CoV-2 by FDA under an Emergency Use Authorization (EUA).  This EUA will remain in effect (meaning this test can be used) for the duration of the COVID-19 declaration under Section 564(b)(1) of the Act, 21 U.S.C. section 360bbb-3(b)(1), unless the authorization is terminated or revoked sooner. Performed at [facility], [address] [place], [place] [address]   Culture, sputum-assessment     Status: None   Collection Time: 08/19/19  1:11 AM   Specimen: Sputum  Result Value Ref Range Status   Specimen Description SPUTUM  Final   Special Requests NONE  Final   Sputum evaluation   Final    Sputum specimen not acceptable for testing.  Please recollect.   C/CHARLIE FLEETWOOD AT 0102 08/19/19.PMF Performed at [facility], [address] [place], [place] [address]    Report Status 08/19/2019 FINAL  Final  MRSA PCR Screening     Status: None   Collection Time: 08/19/19  1:30 AM   Specimen: Nasal Mucosa; Nasopharyngeal  Result Value Ref Range Status   MRSA by PCR NEGATIVE NEGATIVE Final    Comment:        The GeneXpert MRSA Assay (FDA approved for NASAL specimens only), is one component of a comprehensive MRSA colonization surveillance program. It is not intended to diagnose MRSA infection nor to guide or monitor treatment for MRSA infections. Performed at [facility], [address] [place], [place] [address]   Gastrointestinal Panel by PCR , Stool     Status: None   Collection Time: 08/19/19 12:56 PM   Specimen: Stool  Result Value Ref Range Status   Campylobacter species NOT DETECTED NOT DETECTED Final   Plesimonas shigelloides NOT DETECTED NOT DETECTED Final   Salmonella species NOT DETECTED NOT DETECTED Final   Yersinia enterocolitica NOT DETECTED NOT DETECTED Final   Vibrio species NOT DETECTED NOT DETECTED Final   Vibrio  cholerae NOT DETECTED NOT DETECTED Final   Enteroaggregative E coli (EAEC) NOT DETECTED NOT DETECTED Final   Enteropathogenic E coli (EPEC) NOT DETECTED NOT DETECTED Final   Enterotoxigenic E coli (ETEC) NOT DETECTED NOT DETECTED Final   Shiga like toxin producing E coli (STEC) NOT DETECTED NOT DETECTED Final   Shigella/Enteroinvasive E coli (EIEC) NOT DETECTED NOT DETECTED Final   Cryptosporidium NOT DETECTED NOT DETECTED Final   Cyclospora cayetanensis NOT DETECTED NOT DETECTED Final   Entamoeba histolytica NOT DETECTED NOT DETECTED Final   Giardia lamblia NOT DETECTED NOT DETECTED Final   Adenovirus F40/41 NOT DETECTED NOT DETECTED Final   Astrovirus NOT DETECTED NOT DETECTED Final   Norovirus GI/GII NOT DETECTED NOT DETECTED Final   Rotavirus A NOT DETECTED NOT DETECTED Final   Sapovirus (I, II, IV, and V) NOT DETECTED NOT DETECTED Final    Comment: Performed at [facility], [address], [place], [place] [address]  Urine culture     Status:  None   Collection Time: 08/20/19  9:24 AM   Specimen: Urine  Result Value Ref Range Status   Specimen Description   Final    IN/OUT CATH URINE Performed at [facility], [address], [place], [place] [address]    Special Requests   Final    NONE Performed at [facility], [address] [place], [place] [address]    Culture   Final    NO GROWTH Performed at [facility], [place] [address], [place], [place] [address]    Report Status 08/21/2019 FINAL  Final  C Difficile Quick Screen w PCR reflex     Status: None   Collection Time: 08/20/19 12:01 PM   Specimen: STOOL  Result Value Ref Range Status   C Diff antigen NEGATIVE NEGATIVE Final   C Diff toxin NEGATIVE NEGATIVE Final   C Diff interpretation No C. difficile detected.  Final    Comment: Performed at [facility], [address] [place], [place] [address]  CULTURE, BLOOD (ROUTINE X 2) w Reflex to ID Panel     Status: None    Collection Time: 08/20/19  1:07 PM   Specimen: BLOOD  Result Value Ref Range Status   Specimen Description BLOOD [facility]  Final   Special Requests   Final    BOTTLES DRAWN AEROBIC AND ANAEROBIC Blood Culture adequate volume   Culture   Final    NO GROWTH 5 DAYS Performed at [facility], [address] [place], [place] [address]    Report Status 08/25/2019 FINAL  Final  CULTURE, BLOOD (ROUTINE X 2) w Reflex to ID Panel     Status: None   Collection Time: 08/20/19  1:22 PM   Specimen: BLOOD  Result Value Ref Range Status   Specimen Description BLOOD [facility]  Final   Special Requests   Final    BOTTLES DRAWN AEROBIC AND ANAEROBIC Blood Culture adequate volume   Culture   Final    NO GROWTH 5 DAYS Performed at [facility], [address] [place], [place] [address]    Report Status 08/25/2019 FINAL  Final  Surgical PCR screen     Status: None   Collection Time: 08/25/19 11:53 AM   Specimen: Nasal Mucosa; Nasal Swab  Result Value Ref Range Status   MRSA, PCR NEGATIVE NEGATIVE Final   Staphylococcus aureus NEGATIVE NEGATIVE Final    Comment: (NOTE) The Xpert SA Assay (FDA approved for NASAL specimens in patients 43 years of age and older), is one component of a comprehensive surveillance program. It is not intended to diagnose infection nor to guide or monitor treatment. Performed at [facility], [place] [address], [place], [place] [address]   Culture, blood (routine x 2)     Status: None (Preliminary result)   Collection Time: 08/26/19  6:26 AM   Specimen: BLOOD LEFT HAND  Result Value Ref Range Status   Specimen Description BLOOD LEFT HAND  Final   Special Requests   Final    BOTTLES DRAWN AEROBIC AND ANAEROBIC Blood Culture results may not be optimal due to an inadequate volume of blood received in culture bottles   Culture   Final    NO GROWTH 1 DAY Performed at [facility], [place] [address] [place],  [address]    Report Status PENDING   Incomplete  Culture, blood (routine x 2)     Status: None (Preliminary result)   Collection Time: 08/26/19  6:26 AM   Specimen: BLOOD  Result Value Ref Range Status   Specimen Description BLOOD LEFT ARM  Final   Special Requests   Final    BOTTLES DRAWN AEROBIC AND ANAEROBIC Blood Culture adequate volume   Culture   Final    NO GROWTH 1 DAY Performed at [facility], [address], [place], [place] [address]    Report Status PENDING  Incomplete      Studies: [place] EKG SITE RITE  Result Date: 08/27/2019 If Site Rite image not attached, placement could not be confirmed due to current cardiac rhythm.   Scheduled Meds: . sodium chloride   Intravenous Once  . Chlorhexidine Gluconate Cloth  6 each Topical Daily  . ferrous sulfate  325 mg Oral Daily  . Gerhardt's butt cream   Topical BID  . multivitamin with minerals  1 tablet Oral Daily  . pantoprazole  40 mg Intravenous Q12H  . simvastatin  20 mg Oral q1800  . sodium chloride flush  3 mL Intravenous Q12H    Continuous Infusions: . cefTRIAXone (ROCEPHIN) IVPB 2 gram/100 mL NS (Mini-Bag Plus) 2 g (08/27/19 0003)     LOS: 3 days     Alma Friendly, MD [facility]  If 7PM-7AM, please contact night-coverage [email or web address] 08/27/2019, 4:26 PM

## 2019-08-28 ENCOUNTER — Inpatient Hospital Stay (HOSPITAL_COMMUNITY): Payer: PPO

## 2019-08-28 ENCOUNTER — Encounter (HOSPITAL_COMMUNITY): Payer: Self-pay | Admitting: Internal Medicine

## 2019-08-28 LAB — BASIC METABOLIC PANEL
Anion gap: 5 (ref 5–15)
BUN: 20 mg/dL (ref 8–23)
CO2: 27 mmol/L (ref 22–32)
Calcium: 7.6 mg/dL — ABNORMAL LOW (ref 8.9–10.3)
Chloride: 106 mmol/L (ref 98–111)
Creatinine, Ser: 1.3 mg/dL — ABNORMAL HIGH (ref 0.61–1.24)
GFR calc Af Amer: 59 mL/min — ABNORMAL LOW (ref >60)
GFR calc non Af Amer: 51 mL/min — ABNORMAL LOW (ref >60)
Glucose, Bld: 111 mg/dL — ABNORMAL HIGH (ref 70–99)
Potassium: 3.8 mmol/L (ref 3.5–5.1)
Sodium: 138 mmol/L (ref 135–145)

## 2019-08-28 LAB — CBC WITH DIFFERENTIAL/PLATELET
Abs Immature Granulocytes: 0.03 10*3/uL (ref 0.00–0.07)
Basophils Absolute: 0 10*3/uL (ref 0.0–0.1)
Basophils Relative: 1 %
Eosinophils Absolute: 0.1 10*3/uL (ref 0.0–0.5)
Eosinophils Relative: 2 %
HCT: 26.6 % — ABNORMAL LOW (ref 39.0–52.0)
Hemoglobin: 7.7 g/dL — ABNORMAL LOW (ref 13.0–17.0)
Immature Granulocytes: 1 %
Lymphocytes Relative: 12 %
Lymphs Abs: 0.7 10*3/uL (ref 0.7–4.0)
MCH: 25.2 pg — ABNORMAL LOW (ref 26.0–34.0)
MCHC: 28.9 g/dL — ABNORMAL LOW (ref 30.0–36.0)
MCV: 87.2 fL (ref 80.0–100.0)
Monocytes Absolute: 0.5 10*3/uL (ref 0.1–1.0)
Monocytes Relative: 8 %
Neutro Abs: 4.7 10*3/uL (ref 1.7–7.7)
Neutrophils Relative %: 76 %
Platelets: 194 10*3/uL (ref 150–400)
RBC: 3.05 MIL/uL — ABNORMAL LOW (ref 4.22–5.81)
RDW: 19.9 % — ABNORMAL HIGH (ref 11.5–15.5)
WBC: 6.2 10*3/uL (ref 4.0–10.5)
nRBC: 0 % (ref 0.0–0.2)

## 2019-08-28 MED ORDER — SODIUM CHLORIDE 0.9% FLUSH: 10.0000 mL | INTRAVENOUS | Status: DC | PRN

## 2019-08-28 MED ORDER — IOHEXOL 300 MG/ML  SOLN
75.0000 mL | Freq: Once | INTRAMUSCULAR | Status: AC | PRN
  Administered 2019-08-28: 75 mL via INTRAVENOUS

## 2019-08-28 MED ORDER — FUROSEMIDE 10 MG/ML IJ SOLN
40.0000 mg | Freq: Once | INTRAMUSCULAR | Status: AC
  Administered 2019-08-28: 40 mg via INTRAVENOUS
  Filled 2019-08-28: qty 4

## 2019-08-28 NOTE — Progress Notes (Signed)
PT Cancellation Note  Patient Details Name: SANDEEP RADELL MRN: [id number] DOB: [birth year]   Cancelled Treatment:    Reason Eval/Treat Not Completed: Patient not medically ready; pt currently with temporary R femoral sheath. Will follow up as able for PT eval.  Kittie Plater, PT, DPT Acute Rehabilitation Services Pager #: [phone] Office #: [phone]     Berline Lopes 08/28/2019, 9:27 AM

## 2019-08-28 NOTE — Progress Notes (Signed)
OT Cancellation Note  Patient Details Name: Thomas [name] MRN: [id number] DOB: Jul 21, 1934   Cancelled Treatment:    Reason Eval/Treat Not Completed: Medical issues which prohibited therapy; pt currently with temporary R femoral sheath. Will follow up as able for OT eval.   Lou Cal, OT Acute Rehabilitation Services Pager [phone] Office [place] 08/28/2019, 9:14 AM

## 2019-08-28 NOTE — Progress Notes (Signed)
Peripherally Inserted Central Catheter Placement  The IV Nurse has discussed with the patient and/or persons authorized to consent for the patient, the purpose of this procedure and the potential benefits and risks involved with this procedure.  The benefits include less needle sticks, lab draws from the catheter, and the patient may be discharged home with the catheter. Risks include, but not limited to, infection, bleeding, blood clot (thrombus formation), and puncture of an artery; nerve damage and irregular heartbeat and possibility to perform a PICC exchange if needed/ordered by physician.  Alternatives to this procedure were also discussed.  Bard Power PICC patient education guide, fact sheet on infection prevention and patient information card has been provided to patient /or left at bedside.    PICC Placement Documentation  PICC Single Lumen 08/28/19 PICC Left Brachial 44 cm 0 cm (Active)  Indication for Insertion or Continuance of Line Home intravenous therapies (PICC only) 08/28/19 0900  Exposed Catheter (cm) 0 cm 08/28/19 0900  Site Assessment Clean;Dry;Intact 08/28/19 0900  Line Status Flushed;Blood return noted 08/28/19 0900  Dressing Type Transparent 08/28/19 0900  Dressing Status Clean;Dry;Intact;Antimicrobial disc in place 08/28/19 0900  Dressing Change Due 09/04/19 08/28/19 0900       Thomas [name] 08/28/2019, 10:18 AM

## 2019-08-28 NOTE — Progress Notes (Addendum)
Progress Note  Patient Name: Thomas [name] Date of Encounter: 08/28/2019  [facility] Cardiologist: Sanda Klein, MD   Subjective   Today's visit is done with [employer] ASL sign interpretor Rebeka #[id number]  He feels well,  No CP, palpitations or SOB.  Aware of plans so far, just about to get picc placed  Inpatient Medications    Scheduled Meds:  sodium chloride   Intravenous Once   Chlorhexidine Gluconate Cloth  6 each Topical Daily   ferrous sulfate  325 mg Oral Daily   Gerhardt's butt cream   Topical BID   multivitamin with minerals  1 tablet Oral Daily   pantoprazole  40 mg Intravenous Q12H   simvastatin  20 mg Oral q1800   sodium chloride flush  3 mL Intravenous Q12H   Continuous Infusions:  cefTRIAXone (ROCEPHIN) IVPB 2 gram/100 mL NS (Mini-Bag Plus) Stopped (08/28/19 0054)   PRN Meds: acetaminophen, acetaminophen, albuterol, dextromethorphan-guaiFENesin, ondansetron (ZOFRAN) IV   Vital Signs    Vitals:   08/28/19 0200 08/28/19 0300 08/28/19 0400 08/28/19 0736  BP: (!) 109/55 (!) 107/56 (!) 110/59   Pulse: (!) 59 (!) 59 (!) 59   Resp: 17 (!) 22 (!) 24   Temp:   98.4 F (36.9 C) 97.7 F (36.5 C)  TempSrc:      SpO2: 97% 97% 98%   Weight:      Height:        Intake/Output Summary (Last 24 hours) at 08/28/2019 0901 Last data filed at 08/28/2019 0700 Gross per 24 hour  Intake 500 ml  Output 1425 ml  Net -925 ml   Last 3 Weights 08/26/2019 08/25/2019 08/24/2019  Weight (lbs) 214 lb 8.1 oz 211 lb 13.8 oz 211 lb 10.3 oz  Weight (kg) 97.3 kg 96.1 kg 96 kg      Telemetry    VP occ PVCs, couplets, 2 NSVT episodes - Personally Reviewed  ECG    No new EKGs - Personally Reviewed  Physical Exam   GEN: No acute distress.   Neck: No JVD Cardiac: RRR, no murmurs, rubs, or gallops.  Respiratory: CTA b/l (ant/lat auscultation) b/l GI: Soft, nontender, non-distended  MS: 2++ edema to about mid shin; No deformity. Neuro:  Nonfocal  Psych: Normal affect    Labs    High Sensitivity Troponin:  No results for input(s): TROPONINIHS in the last 720 hours.    Chemistry Recent Labs  Lab 08/24/19 2100 08/25/19 0947 08/26/19 0201 08/27/19 0505 08/28/19 0350  NA 141   < > 142 140 138  K 3.5   < > 4.0 4.3 3.8  CL 108   < > 108 107 106  CO2 28   < > 25 26 27   GLUCOSE 116*   < > 124* 100* 111*  BUN 12   < > 12 20 20   CREATININE 1.06   < > 1.13 1.32* 1.30*  CALCIUM 7.5*   < > 7.2* 7.2* 7.6*  ALBUMIN 1.6*  --   --   --   --   GFRNONAA >60   < > >60 50* 51*  GFRAA >60   < > >60 58* 59*  ANIONGAP 5   < > 9 7 5    < > = values in this interval not displayed.     Hematology Recent Labs  Lab 08/26/19 0201 08/27/19 0505 08/28/19 0350  WBC 6.8 7.8 6.2  RBC 3.36* 3.29* 3.05*  HGB 8.3* 8.1* 7.7*  HCT 28.5* 28.4* 26.6*  MCV 84.8 86.3 87.2  MCH 24.7* 24.6* 25.2*  MCHC 29.1* 28.5* 28.9*  RDW 19.9* 19.9* 19.9*  PLT 192 220 194    BNP Recent Labs  Lab 08/24/19 2100  BNP 480.2*     DDimer No results for input(s): DDIMER in the last 168 hours.   Radiology    DG CHEST PORT 1 VIEW Result Date: 08/28/2019 CLINICAL DATA:  Chest pain EXAM: PORTABLE CHEST 1 VIEW COMPARISON:  August 26, 2019 FINDINGS: There is cardiomegaly. There is mild pulmonary venous hypertension. Pacemaker leads attached to right atrium and coronary sinus,, stable. There are persistent bilateral pleural effusions with airspace opacity in the left lower lobe, stable. No new opacity evident. There is no evident adenopathy. There is aortic atherosclerosis. No bone lesions. IMPRESSION: Cardiomegaly with a degree of pulmonary vascular congestion. Persistent pleural effusions bilaterally with left lower lobe airspace opacity concerning for atelectasis and potential pneumonia in the left lower lung region. No new opacity evident. Pacemaker leads unchanged in position. Status post coronary artery bypass grafting. Aortic Atherosclerosis (ICD10-I70.0). Electronically Signed   By: Lowella Grip III M.D.   On: 08/28/2019 08:11    VAS [place] UPPER EXTREMITY VENOUS DUPLEX Result Date: 08/26/2019 UPPER VENOUS STUDY  Indications: Swelling Performing Technologist: June Leap RDMS, RVT  Examination Guidelines: A complete evaluation includes B-mode imaging, spectral Doppler, color Doppler, and power Doppler as needed of all accessible portions of each vessel. Bilateral testing is considered an integral part of a complete examination. Limited examinations for reoccurring indications may be performed as noted.  Summary:  Left: No evidence of deep vein thrombosis in the upper extremity. Findings consistent with acute superficial vein thrombosis involving the left cephalic vein.  *See table(s) above for measurements and observations.  Diagnosing physician: Monica Martinez MD Electronically signed by Monica Martinez MD on 08/26/2019 at 12:49:19 PM.    Final       Cardiac Studies   08/23/2019: TEE IMPRESSIONS  1. Left ventricular ejection fraction, by estimation, is 55 to 60%. The  left ventricle has normal function. The left ventricle has no regional  wall motion abnormalities.   2. Right ventricular systolic function is normal. The right ventricular  size is normal.   3. Left atrial size was mild to moderately dilated. No left atrial/left  atrial appendage thrombus was detected.   4. Right atrial size was mildly dilated.   5. The mitral valve is normal in structure. Mild mitral valve  regurgitation.   6. Tricuspid valve regurgitation is moderate to severe.   7. The aortic valve is tricuspid. Aortic valve regurgitation is mild.   Conclusion(s)/Recommendation(s): No evidence of vegetation/infective  endocarditis on this transesophageal  echocardiogram.   Patient Profile     85 y.o. male w/PMHx of congenital deafness, CAD s/p 1986 CABG as below, 2000 inferior STEMI with subsequent VT/ 2nd degree AVB /CHB, dual chamber PPM implantation 04/25/1998 due to CHB with change-out 2007,  2014 [employer] CRT-D due to EF 30% despite GDMT, multiple catheterizations / PCIs, PCI/BMS placed SVG-OM 2015, known CTO of the SVG-RCA, HFrEF with recovery of EF by most recent  (55-60%, 08/2019), lymphedema, AAA and aortic root dilation, remote strokes as seen on CT imaging, permanent Afib not on [place] due to GIB, s/p transfusion / known AVMs / falls, anemia, CKDIII, prostate CA, HTN, HLD.  He has h/o recurrent group B streptococcus with recent 06/2019 admission due to sepsis with blood cultures positive for strep B sepsis. He was initially placed on broad-spectrum  antibiotics, which were then consolidated to penicillin. TEE 06/2019 was performed and without evidence of vegetation on pacer wires or cardiac valves. He was IV diuresed and sent home on prolonged abx for 6-8 weeks.  Of note, ultrasound was performed of his left upper extremity due to LUE>RUE and without evidence of DVT.   He was readmitted to [facility] after near syncope 08/18/2019 and found to have recurrent group B streptococcus bacteremia. He noted a strange feeling of "coldness" in his chest, dizziness, and intermittent SOB with his near fall. During admission, he reported melena. Subsequent 08/22/2019 enteroscopy was without evidence of active GIB.  Recovery of EF noted in 08/2019 echo. TEE  08/23/2019 was without evidence of vegetation on device or cardiac valves.  Infectious disease recommendation was for extraction of device, given recurrent bacteremia despite prolonged course of antibiotics.  Case was thus discussed with Dr. [name] of [facility] given multiple comorbid conditions and likely high risk extraction. EP reviewed the case with recommendation to transfer to [facility] secondary to class I indication for extraction of device  Assessment & Plan    Sepsis recurrent Group B streptococcus bacteremia Unknown source, of note, very poor dentition (will need dental evaluation) Afebrile with no leukocytosis BC X 2 done on 08/20/19 were  negative repeat BC (x2) done on 08/26/19 after pacemaker removal negative x1 day (one bottle with inadequate volume) TEE was performed on 6/9 with no evidence of vegetations   ID signed off yesterday noting: Continue with ceftriaxone through 09/22/19.  PICC line order placed for tomorrow for left arm placement OPAT orders in IV HH orders under consult He will follow up with ID, Dr. [name] at [facility] for dental evaluation today   CHB CRT-D system extraction 08/25/2019 Has temp-perm L IJ He also has a temp wire via R groin Will need to discuss timing of new implant Await dental evaluation and recommendations  Dr. [name] will see later today, I would anticipate at least discontinuing temp wire and discuss timimg of new permanent device/type     Left base PNA Currently on RA, afebrile Initial CXR with L base PNA, repeat with worsening congestion Continue IV Ceftriaxone Supplemental O2 as needed, duoneb prn, inhalers Appreciate IM service   Acute on chronic diastolic HF CXR as above Recent TEE with EF of 55-60% Getting intermittent lasix He is edematous, he reports this extent of edema a month or two now Plan to restart home torsemide, cozaar, aldactone once BP permits Strict I&O, daily weight cumulatively fluid negative (-1209)  He may need more lasix.   If medicine decides to transfuse, please give lasix    LUE edema USS negative for DVT in 06/2019, repeat here showed no DVT, did find acute superficial vein thrombosis Conservative management per IM Unable to receive AC due to GIB   Acute on chronic blood loss anemia with iron def Baseline hemoglobin between 7-8 S/P SB capsule endoscopy without any source of bleeding S/P IV iron, 2U PRBC H/H down some Defer to IM management, and transfusion if need DVT prophylaxis held yesterday  SCDs if IM felt unable to continue pharmacologic prophylaxis     Permanent Afib Rate controlled, paced rhythm Currently  not on AC due to GIB   CAD/ CABG, DES/ HLD Remains Chest pain free Continue statin   HTN Relative hypotension here   CKD stage 3a Some mild AKI   For questions or updates, please contact [facility] Please consult [email or web address] for contact info under  Signed, Baldwin Jamaica, PA-C  08/28/2019, 9:01 AM    EP Attending  Patient seen and examined. Agree with above. The patient is stable today. He is pending a CT of the mouth to evaluate his teeth from abscess. If none, we will consider re-implant of his PPM later in the week. He will continue IV anti-biotics. I am concerned about potential thromboembolism.   Mikle Bosworth.D.

## 2019-08-28 NOTE — Progress Notes (Signed)
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 At [facility] patient was followed by both Dr. [name] with ID and  Dr. [name] with Cardiology. TEE was performed on 6/9 with no evidence of vegetations. Despite this finding, infectious disease recommended transfer to [facility] for removal of ICD considering recurrent group B strep bacteremia. During patient's hospital course, patient also was found to be anemic s/p 2U PRBC. Dr. [name] with GI was consulted who performed small bowel enteroscopy revealing no evidence of significant pathology in the duodenum, jejunum, esophagus or stomach. Pt transferred for further management.    Today, spoke to patient via interpreter service, denies any new complaints. Asked questions about his dentition and what was going to be done. All questions answered to the best of my ability. Denies any chest pain, SOB or palpitations    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 (HCC)   Acute renal failure  superimposed on stage 3a chronic kidney disease (HCC)   Sepsis due to group B Streptococcus (HCC)   Duodenal arteriovenous malformation   Candidal intertrigo   Edema of left upper extremity   Pneumonia of left lower lobe due to group B Streptococcus (HCC)   Septic shock likely 2/2 recurrent Group B streptococcus bacteremia Unknown source, of note, very poor dentition Currently afebrile, with no leukocytosis BC X 2 done on 08/20/19 showed NGTD, repeat done on 08/26/19 after pacemaker removal NGTD TEE was performed on 6/9 with no evidence of vegetations ID on board, rec IV ceftriaxone through 09/22/19 PICC line placed on 08/28/19 EP on board, s/p removal of AICD on 08/25/19, temp wire in place Dr [name], dentist on board, appreciate recs Continue Rocephin Outpt follow up with ID, Dr [name] Monitor closely  Left base PNA Currently on RA, afebrile Initial CXR with L base PNA, repeat with worsening congestion Continue IV Ceftriaxone Supplemental O2 as needed, duoneb prn, inhalers  Acute on chronic systolic HF CXR as above Recent TEE with EF of 55-60% EP/cardiology on board, rec IV lasix prn Plan to restart home torsemide, cozaar, aldactone once BP permits, received a dose of lasix on 08/28/19 by cardiology Strict I&O, daily weight  LUE edema USS negative for DVT in 06/2019, repeat showed acute superficial vein thrombosis Conservative management  Unable to receive AC due to GIB  Acute on chronic blood loss anemia with iron def Baseline hemoglobin between 7-8 S/P SB capsule endoscopy without any source of bleeding S/P IV iron, 2U PRBC Continue PPI, hold AC Daily CBC  Permanent Afib Rate controlled, paced rhythm  Currently not on AC due to GIB Telemetry  CAD/ CABG, DES/ HLD Chest pain free Continue statin  HTN BP soft Restart cozaar once able  CKD stage 3a Some mild AKI Stable at baseline Daily BMP        Malnutrition Type:      Malnutrition  Characteristics:      Nutrition Interventions:       Estimated body mass index is 27.54 kg/m as calculated from the following:   Height as of this encounter: 6\' 2"  (1.88 m).   Weight as of this encounter: 97.3 kg.     Code Status: Full  Family Communication: None  Disposition Plan: Status is: Inpatient  Remains inpatient appropriate because:Inpatient level of care appropriate due to severity of illness   Dispo: The patient is from: Home              Anticipated d/c is to: Home              Anticipated d/c date is: 3 days              Patient currently is not medically stable to d/c.    Consultants:  EP/Cardiology  ID  GI  Procedures:  Removal of AICD  SB enteroscopy  Antimicrobials: IV ceftriaxone  DVT prophylaxis: SCD   Objective: Vitals:   08/28/19 1200 08/28/19 1215 08/28/19 1300 08/28/19 1509  BP: (!) 84/70 (!) 108/57 110/64   Pulse: 61 61 (!) 59   Resp: (!) 21 (!) 25 19   Temp:    97.8 F (36.6 C)  TempSrc:      SpO2: 98% 99% 96%   Weight:      Height:        Intake/Output Summary (Last 24 hours) at 08/28/2019 1640 Last data filed at 08/28/2019 1500 Gross per 24 hour  Intake 983 ml  Output 1050 ml  Net -67 ml   Filed Weights   08/24/19 1853 08/25/19 0326 08/26/19 0450  Weight: 96 kg 96.1 kg 97.3 kg    Exam:  General: NAD, deaf  Cardiovascular: S1, S2 present  Respiratory: Mild bibasilar crackles noted  Abdomen: Soft, nontender, nondistended, bowel sounds present  Musculoskeletal: 1+ bilateral pedal edema noted, chronic lymphedema, LUE edema  Skin: Rash noted around groin and buttocks  Psychiatry: Normal mood   Data Reviewed: CBC: Recent Labs  Lab 08/24/19 2100 08/25/19 0947 08/26/19 0201 08/27/19 0505 08/28/19 0350  WBC 6.2 5.2 6.8 7.8 6.2  NEUTROABS 4.4 3.5 6.0 5.9 4.7  HGB 8.6* 8.7* 8.3* 8.1* 7.7*  HCT 29.2* 29.3* 28.5* 28.4* 26.6*  MCV 83.4 83.0 84.8 86.3 87.2  PLT 222 203 192 220 774   Basic  Metabolic Panel: Recent Labs  Lab 08/22/19 0320 08/22/19 0320 08/23/19 0448 08/23/19 0448 08/24/19 2100 08/25/19 0947 08/26/19 0201 08/27/19 0505 08/28/19 0350  NA 141   < > 145   < > 141 142 142 140 138  K 3.2*   < > 3.5   < > 3.5 3.6 4.0 4.3 3.8  CL 107   < > 111   < > 108 108 108 107 106  CO2 27   < > 29   < > 28 27 25 26 27   GLUCOSE 96   < > 86   < > 116* 99 124* 100* 111*  BUN 31*   < > 20   < > 12 11 12 20 20   CREATININE 1.07   < > 0.88   < >  1.06 0.99 1.13 1.32* 1.30*  CALCIUM 7.8*   < > 7.6*   < > 7.5* 7.5* 7.2* 7.2* 7.6*  MG 2.3  --  2.3  --  2.1  --  1.9  --   --   PHOS  --   --   --   --  2.3*  --   --   --   --    < > = values in this interval not displayed.   GFR: Estimated Creatinine Clearance: 50.9 mL/min (A) (by C-G formula based on SCr of 1.3 mg/dL (H)). Liver Function Tests: Recent Labs  Lab 08/24/19 2100  ALBUMIN 1.6*   No results for input(s): LIPASE, AMYLASE in the last 168 hours. No results for input(s): AMMONIA in the last 168 hours. Coagulation Profile: Recent Labs  Lab 08/24/19 2100  INR 1.3*   Cardiac Enzymes: No results for input(s): CKTOTAL, CKMB, CKMBINDEX, TROPONINI in the last 168 hours. BNP (last 3 results) No results for input(s): PROBNP in the last 8760 hours. HbA1C: No results for input(s): HGBA1C in the last 72 hours. CBG: Recent Labs  Lab 08/25/19 0518 08/26/19 0455  GLUCAP 93 114*   Lipid Profile: No results for input(s): CHOL, HDL, LDLCALC, TRIG, CHOLHDL, LDLDIRECT in the last 72 hours. Thyroid Function Tests: No results for input(s): TSH, T4TOTAL, FREET4, T3FREE, THYROIDAB in the last 72 hours. Anemia Panel: No results for input(s): VITAMINB12, FOLATE, FERRITIN, TIBC, IRON, RETICCTPCT in the last 72 hours. Urine analysis:    Component Value Date/Time   COLORURINE YELLOW (A) 08/20/2019 0924   APPEARANCEUR HAZY (A) 08/20/2019 0924   APPEARANCEUR Clear 12/13/2017 0918   LABSPEC 1.015 08/20/2019 0924   LABSPEC 1.021  11/19/2011 1501   PHURINE 5.0 08/20/2019 0924   GLUCOSEU NEGATIVE 08/20/2019 0924   GLUCOSEU Negative 11/19/2011 1501   HGBUR NEGATIVE 08/20/2019 0924   BILIRUBINUR NEGATIVE 08/20/2019 0924   BILIRUBINUR Negative 12/13/2017 0918   BILIRUBINUR Negative 11/19/2011 1501   KETONESUR NEGATIVE 08/20/2019 0924   PROTEINUR NEGATIVE 08/20/2019 0924   NITRITE NEGATIVE 08/20/2019 0924   LEUKOCYTESUR NEGATIVE 08/20/2019 0924   LEUKOCYTESUR Negative 11/19/2011 1501   Sepsis Labs: @LABRCNTIP (procalcitonin:4,lacticidven:4)  ) Recent Results (from the past 240 hour(s))  Culture, sputum-assessment     Status: None   Collection Time: 08/19/19  1:11 AM   Specimen: Sputum  Result Value Ref Range Status   Specimen Description SPUTUM  Final   Special Requests NONE  Final   Sputum evaluation   Final    Sputum specimen not acceptable for testing.  Please recollect.   C/CHARLIE FLEETWOOD AT 3810 08/19/19.PMF Performed at [facility], [address] [place], [place] [address]    Report Status 08/19/2019 FINAL  Final  MRSA PCR Screening     Status: None   Collection Time: 08/19/19  1:30 AM   Specimen: Nasal Mucosa; Nasopharyngeal  Result Value Ref Range Status   MRSA by PCR NEGATIVE NEGATIVE Final    Comment:        The GeneXpert MRSA Assay (FDA approved for NASAL specimens only), is one component of a comprehensive MRSA colonization surveillance program. It is not intended to diagnose MRSA infection nor to guide or monitor treatment for MRSA infections. Performed at [facility], [address] [place], [place] [address]   Gastrointestinal Panel by PCR , Stool     Status: None   Collection Time: 08/19/19 12:56 PM   Specimen: Stool  Result Value Ref Range Status  Campylobacter species NOT DETECTED NOT DETECTED Final   Plesimonas shigelloides NOT DETECTED NOT DETECTED Final   Salmonella species NOT DETECTED NOT DETECTED Final   Yersinia enterocolitica NOT DETECTED NOT  DETECTED Final   Vibrio species NOT DETECTED NOT DETECTED Final   Vibrio cholerae NOT DETECTED NOT DETECTED Final   Enteroaggregative E coli (EAEC) NOT DETECTED NOT DETECTED Final   Enteropathogenic E coli (EPEC) NOT DETECTED NOT DETECTED Final   Enterotoxigenic E coli (ETEC) NOT DETECTED NOT DETECTED Final   Shiga like toxin producing E coli (STEC) NOT DETECTED NOT DETECTED Final   Shigella/Enteroinvasive E coli (EIEC) NOT DETECTED NOT DETECTED Final   Cryptosporidium NOT DETECTED NOT DETECTED Final   Cyclospora cayetanensis NOT DETECTED NOT DETECTED Final   Entamoeba histolytica NOT DETECTED NOT DETECTED Final   Giardia lamblia NOT DETECTED NOT DETECTED Final   Adenovirus F40/41 NOT DETECTED NOT DETECTED Final   Astrovirus NOT DETECTED NOT DETECTED Final   Norovirus GI/GII NOT DETECTED NOT DETECTED Final   Rotavirus A NOT DETECTED NOT DETECTED Final   Sapovirus (I, II, IV, and V) NOT DETECTED NOT DETECTED Final    Comment: Performed at [facility], [address] [place], [place] [address]  Urine culture     Status: None   Collection Time: 08/20/19  9:24 AM   Specimen: Urine  Result Value Ref Range Status   Specimen Description   Final    IN/OUT CATH URINE Performed at [facility], [address], [place], [place] [address]    Special Requests   Final    NONE Performed at [facility], [address], [place], [place] [address]    Culture   Final    NO GROWTH Performed at [facility], [address], [place], [place] [address]    Report Status 08/21/2019 FINAL  Final  C Difficile Quick Screen w PCR reflex     Status: None   Collection Time: 08/20/19 12:01 PM   Specimen: STOOL  Result Value Ref Range Status   C Diff antigen NEGATIVE NEGATIVE Final   C Diff toxin NEGATIVE NEGATIVE Final   C Diff interpretation No C. difficile detected.  Final    Comment: Performed at [facility], [address] [place], [place] [address]    CULTURE, BLOOD (ROUTINE X 2) w Reflex to ID Panel     Status: None   Collection Time: 08/20/19  1:07 PM   Specimen: BLOOD  Result Value Ref Range Status   Specimen Description BLOOD [facility]  Final   Special Requests   Final    BOTTLES DRAWN AEROBIC AND ANAEROBIC Blood Culture adequate volume   Culture   Final    NO GROWTH 5 DAYS Performed at [facility], [address] [place], [place] [address]    Report Status 08/25/2019 FINAL  Final  CULTURE, BLOOD (ROUTINE X 2) w Reflex to ID Panel     Status: None   Collection Time: 08/20/19  1:22 PM   Specimen: BLOOD  Result Value Ref Range Status   Specimen Description BLOOD [facility]  Final   Special Requests   Final    BOTTLES DRAWN AEROBIC AND ANAEROBIC Blood Culture adequate volume   Culture   Final    NO GROWTH 5 DAYS Performed at [facility], [address] [place],  [address]    Report Status 08/25/2019 FINAL  Final  Surgical PCR screen     Status: None  Collection Time: 08/25/19 11:53 AM   Specimen: Nasal Mucosa; Nasal Swab  Result Value Ref Range Status   MRSA, PCR NEGATIVE NEGATIVE Final   Staphylococcus aureus NEGATIVE NEGATIVE Final    Comment: (NOTE) The Xpert SA Assay (FDA approved for NASAL specimens in patients 37 years of age and older), is one component of a comprehensive surveillance program. It is not intended to diagnose infection nor to guide or monitor treatment. Performed at [facility], [place] [address], [place], [place] [address]   Culture, blood (routine x 2)     Status: None (Preliminary result)   Collection Time: 08/26/19  6:26 AM   Specimen: BLOOD LEFT HAND  Result Value Ref Range Status   Specimen Description BLOOD LEFT HAND  Final   Special Requests   Final    BOTTLES DRAWN AEROBIC AND ANAEROBIC Blood Culture results may not be optimal due to an inadequate volume of blood received in culture bottles   Culture   Final    NO GROWTH 2 DAYS Performed at [place], [place] [address] [place], [place] [address]    Report Status PENDING  Incomplete  Culture, blood (routine x 2)     Status: None (Preliminary result)   Collection Time: 08/26/19  6:26 AM   Specimen: BLOOD  Result Value Ref Range Status   Specimen Description BLOOD LEFT ARM  Final   Special Requests   Final    BOTTLES DRAWN AEROBIC AND ANAEROBIC Blood Culture adequate volume   Culture   Final    NO GROWTH 2 DAYS Performed at [facility], [place] [address] [place], [place] [address]    Report Status PENDING  Incomplete      Studies: DG CHEST PORT 1 VIEW  Result Date: 08/28/2019 CLINICAL DATA:  Chest pain EXAM: PORTABLE CHEST 1 VIEW COMPARISON:  August 26, 2019 FINDINGS: There is cardiomegaly. There is mild pulmonary venous hypertension. Pacemaker leads attached to right atrium and coronary sinus,, stable. There are persistent bilateral pleural effusions with airspace opacity in the left lower lobe, stable. No new opacity evident. There is no evident adenopathy. There is aortic atherosclerosis. No bone lesions. IMPRESSION: Cardiomegaly with a degree of pulmonary vascular congestion. Persistent pleural effusions bilaterally with left lower lobe airspace opacity concerning for atelectasis and potential pneumonia in the left lower lung region. No new opacity evident. Pacemaker leads unchanged in position. Status post coronary artery bypass grafting. Aortic Atherosclerosis (ICD10-I70.0). Electronically Signed   By: Lowella Grip III M.D.   On: 08/28/2019 08:11   DG Chest Port 1V same Day  Result Date: 08/28/2019 CLINICAL DATA:  PICC placement. EXAM: PORTABLE CHEST 1 VIEW COMPARISON:  08/28/2019 and CT chest 05/19/2019. FINDINGS: Left PICC tip is in the low SVC. Heart is enlarged, stable. Thoracic aorta is calcified. Permanent pacemaker lead tip and trans venous temporary pacemaker lead tip, from an IVC approach, are unchanged in position. Mid and lower lung zone mixed interstitial and airspace  opacification with bilateral pleural effusions, similar. Left lower lobe consolidation persists. IMPRESSION: 1. Left PICC tip is in the SVC. 2. Congestive heart failure with left lower lobe collapse/consolidation. Pneumonia cannot be excluded. 3.  Aortic atherosclerosis (ICD10-I70.0). Electronically Signed   By: Lorin Picket M.D.   On: 08/28/2019 10:49    Scheduled Meds: . sodium chloride   Intravenous Once  . Chlorhexidine Gluconate Cloth  6 each Topical Daily  . ferrous sulfate  325 mg Oral Daily  . Gerhardt's  butt cream   Topical BID  . multivitamin with minerals  1 tablet Oral Daily  . pantoprazole  40 mg Intravenous Q12H  . simvastatin  20 mg Oral q1800  . sodium chloride flush  3 mL Intravenous Q12H    Continuous Infusions: . cefTRIAXone (ROCEPHIN) IVPB 2 gram/100 mL NS (Mini-Bag Plus) Stopped (08/28/19 0054)     LOS: 4 days     Alma Friendly, MD [facility]  If 7PM-7AM, please contact night-coverage [email or web address] 08/28/2019, 4:40 PM

## 2019-08-29 ENCOUNTER — Inpatient Hospital Stay (HOSPITAL_COMMUNITY): Payer: PPO

## 2019-08-29 ENCOUNTER — Encounter (HOSPITAL_COMMUNITY): Payer: Self-pay | Admitting: Cardiovascular Disease

## 2019-08-29 ENCOUNTER — Telehealth: Payer: Self-pay

## 2019-08-29 LAB — TYPE AND SCREEN
ABO/RH(D): O POS
Antibody Screen: NEGATIVE
Donor AG Type: NEGATIVE
Donor AG Type: NEGATIVE
Unit division: 0
Unit division: 0
Unit division: 0

## 2019-08-29 LAB — BPAM RBC
Blood Product Expiration Date: 202107172359
Blood Product Expiration Date: 202107172359
Blood Product Expiration Date: 202107172359
ISSUE DATE / TIME: 202106111602
ISSUE DATE / TIME: 202106111602
Unit Type and Rh: 5100
Unit Type and Rh: 5100
Unit Type and Rh: 5100

## 2019-08-29 LAB — CBC WITH DIFFERENTIAL/PLATELET
Abs Immature Granulocytes: 0.05 10*3/uL (ref 0.00–0.07)
Basophils Absolute: 0 10*3/uL (ref 0.0–0.1)
Basophils Relative: 1 %
Eosinophils Absolute: 0.1 10*3/uL (ref 0.0–0.5)
Eosinophils Relative: 2 %
HCT: 26.4 % — ABNORMAL LOW (ref 39.0–52.0)
Hemoglobin: 7.7 g/dL — ABNORMAL LOW (ref 13.0–17.0)
Immature Granulocytes: 1 %
Lymphocytes Relative: 12 %
Lymphs Abs: 0.8 10*3/uL (ref 0.7–4.0)
MCH: 25.2 pg — ABNORMAL LOW (ref 26.0–34.0)
MCHC: 29.2 g/dL — ABNORMAL LOW (ref 30.0–36.0)
MCV: 86.3 fL (ref 80.0–100.0)
Monocytes Absolute: 0.5 10*3/uL (ref 0.1–1.0)
Monocytes Relative: 7 %
Neutro Abs: 5.1 10*3/uL (ref 1.7–7.7)
Neutrophils Relative %: 77 %
Platelets: 207 10*3/uL (ref 150–400)
RBC: 3.06 MIL/uL — ABNORMAL LOW (ref 4.22–5.81)
RDW: 20 % — ABNORMAL HIGH (ref 11.5–15.5)
WBC: 6.6 10*3/uL (ref 4.0–10.5)
nRBC: 0 % (ref 0.0–0.2)

## 2019-08-29 LAB — BASIC METABOLIC PANEL
Anion gap: 8 (ref 5–15)
BUN: 17 mg/dL (ref 8–23)
CO2: 27 mmol/L (ref 22–32)
Calcium: 7.7 mg/dL — ABNORMAL LOW (ref 8.9–10.3)
Chloride: 103 mmol/L (ref 98–111)
Creatinine, Ser: 1.1 mg/dL (ref 0.61–1.24)
GFR calc Af Amer: >60 mL/min (ref >60)
GFR calc non Af Amer: >60 mL/min (ref >60)
Glucose, Bld: 98 mg/dL (ref 70–99)
Potassium: 3.8 mmol/L (ref 3.5–5.1)
Sodium: 138 mmol/L (ref 135–145)

## 2019-08-29 MED ORDER — SODIUM CHLORIDE 0.9 % IV SOLN
INTRAVENOUS | Status: DC | PRN
  Administered 2019-08-29: 250 mL via INTRAVENOUS

## 2019-08-29 MED ORDER — FUROSEMIDE 10 MG/ML IJ SOLN
40.0000 mg | Freq: Two times a day (BID) | INTRAMUSCULAR | Status: DC
  Administered 2019-08-29 – 2019-09-01 (×8): 40 mg via INTRAVENOUS
  Filled 2019-08-29 (×8): qty 4

## 2019-08-29 MED ORDER — HEPARIN SODIUM (PORCINE) 5000 UNIT/ML IJ SOLN
5000.0000 [IU] | Freq: Three times a day (TID) | INTRAMUSCULAR | Status: DC
  Administered 2019-08-29 – 2019-09-01 (×9): 5000 [IU] via SUBCUTANEOUS
  Filled 2019-08-29 (×9): qty 1

## 2019-08-29 MED ORDER — CEFAZOLIN SODIUM-DEXTROSE 2-4 GM/100ML-% IV SOLN
2.0000 g | INTRAVENOUS | Status: AC
  Administered 2019-08-30: 2 g via INTRAVENOUS
  Filled 2019-08-29: qty 100

## 2019-08-29 NOTE — Progress Notes (Addendum)
Progress Note  Patient Name: Thomas [name] Date of Encounter: 08/29/2019  [facility] Cardiologist: Sanda Klein, MD   Subjective   Today's visit is done with [employer] video interprtor ASL sign interpretor Daleen Snook #[id number] and Guy Begin #[id number]  He continues to feel well,  No CP, palpitations or SOB.    Inpatient Medications    Scheduled Meds:  sodium chloride   Intravenous Once   Chlorhexidine Gluconate Cloth  6 each Topical Daily   ferrous sulfate  325 mg Oral Daily   Gerhardt's butt cream   Topical BID   multivitamin with minerals  1 tablet Oral Daily   pantoprazole  40 mg Intravenous Q12H   simvastatin  20 mg Oral q1800   sodium chloride flush  3 mL Intravenous Q12H   Continuous Infusions:  cefTRIAXone (ROCEPHIN) IVPB 2 gram/100 mL NS (Mini-Bag Plus) Stopped (08/28/19 2351)   PRN Meds: acetaminophen, acetaminophen, albuterol, dextromethorphan-guaiFENesin, ondansetron (ZOFRAN) IV, sodium chloride flush   Vital Signs    Vitals:   08/29/19 0600 08/29/19 0700 08/29/19 0745 08/29/19 0800  BP: (!) 110/59 (!) 114/56  (!) 110/59  Pulse: (!) 59 (!) 59 (!) 57 (!) 59  Resp: 19 18 (!) 26 (!) 22  Temp:   98.3 F (36.8 C)   TempSrc:   Oral   SpO2:  97% 93% 94%  Weight:      Height:        Intake/Output Summary (Last 24 hours) at 08/29/2019 0832 Last data filed at 08/29/2019 0700 Gross per 24 hour  Intake 1306 ml  Output 560 ml  Net 746 ml   Last 3 Weights 08/29/2019 08/26/2019 08/25/2019  Weight (lbs) 221 lb 12.5 oz 214 lb 8.1 oz 211 lb 13.8 oz  Weight (kg) 100.6 kg 97.3 kg 96.1 kg      Telemetry    VP occ PVCs, couplets, NSVT episodes - Personally Reviewed  ECG    No new EKGs - Personally Reviewed  Physical Exam   GEN: No acute distress.   Neck: No JVD Cardiac: RRR, no murmurs, rubs, or gallops.  Respiratory: CTA b/l (ant/lat auscultation) b/l GI: Soft, nontender, non-distended  MS: 2++ edema to about mid shin; No deformity. Neuro:  Nonfocal  Psych: Normal  affect   R groin: dressing is clean and dry, temp wire in place  R IJ temp perm dressing was changed today with Dr. [name] present.  No bleeding, device and lead remain sutured in place  Labs    High Sensitivity Troponin:  No results for input(s): TROPONINIHS in the last 720 hours.    Chemistry Recent Labs  Lab 08/24/19 2100 08/25/19 0947 08/27/19 0505 08/28/19 0350 08/29/19 0337  NA 141   < > 140 138 138  K 3.5   < > 4.3 3.8 3.8  CL 108   < > 107 106 103  CO2 28   < > 26 27 27   GLUCOSE 116*   < > 100* 111* 98  BUN 12   < > 20 20 17   CREATININE 1.06   < > 1.32* 1.30* 1.10  CALCIUM 7.5*   < > 7.2* 7.6* 7.7*  ALBUMIN 1.6*  --   --   --   --   GFRNONAA >60   < > 50* 51* >60  GFRAA >60   < > 58* 59* >60  ANIONGAP 5   < > 7 5 8    < > = values in this interval not displayed.  Hematology Recent Labs  Lab 08/27/19 0505 08/28/19 0350 08/29/19 0337  WBC 7.8 6.2 6.6  RBC 3.29* 3.05* 3.06*  HGB 8.1* 7.7* 7.7*  HCT 28.4* 26.6* 26.4*  MCV 86.3 87.2 86.3  MCH 24.6* 25.2* 25.2*  MCHC 28.5* 28.9* 29.2*  RDW 19.9* 19.9* 20.0*  PLT 220 194 207    BNP Recent Labs  Lab 08/24/19 2100  BNP 480.2*     DDimer No results for input(s): DDIMER in the last 168 hours.   Radiology    08/28/2019: CT, maxillofacial  IMPRESSION: 1. Numerous caries bilaterally. Right upper third molar caries with periapical lucency. 2. Soft tissue mass in the upper nasal cavity bilaterally. Recommend direct visualization and biopsy if appropriate   DG CHEST PORT 1 VIEW Result Date: 08/28/2019 CLINICAL DATA:  Chest pain EXAM: PORTABLE CHEST 1 VIEW COMPARISON:  August 26, 2019 FINDINGS: There is cardiomegaly. There is mild pulmonary venous hypertension. Pacemaker leads attached to right atrium and coronary sinus,, stable. There are persistent bilateral pleural effusions with airspace opacity in the left lower lobe, stable. No new opacity evident. There is no evident adenopathy. There is aortic  atherosclerosis. No bone lesions. IMPRESSION: Cardiomegaly with a degree of pulmonary vascular congestion. Persistent pleural effusions bilaterally with left lower lobe airspace opacity concerning for atelectasis and potential pneumonia in the left lower lung region. No new opacity evident. Pacemaker leads unchanged in position. Status post coronary artery bypass grafting. Aortic Atherosclerosis (ICD10-I70.0). Electronically Signed   By: Lowella Grip III M.D.   On: 08/28/2019 08:11    VAS [place] UPPER EXTREMITY VENOUS DUPLEX Result Date: 08/26/2019 UPPER VENOUS STUDY  Indications: Swelling Performing Technologist: June Leap RDMS, RVT  Examination Guidelines: A complete evaluation includes B-mode imaging, spectral Doppler, color Doppler, and power Doppler as needed of all accessible portions of each vessel. Bilateral testing is considered an integral part of a complete examination. Limited examinations for reoccurring indications may be performed as noted.  Summary:  Left: No evidence of deep vein thrombosis in the upper extremity. Findings consistent with acute superficial vein thrombosis involving the left cephalic vein.  *See table(s) above for measurements and observations.  Diagnosing physician: Monica Martinez MD Electronically signed by Monica Martinez MD on 08/26/2019 at 12:49:19 PM.    Final       Cardiac Studies   08/23/2019: TEE IMPRESSIONS  1. Left ventricular ejection fraction, by estimation, is 55 to 60%. The  left ventricle has normal function. The left ventricle has no regional  wall motion abnormalities.   2. Right ventricular systolic function is normal. The right ventricular  size is normal.   3. Left atrial size was mild to moderately dilated. No left atrial/left  atrial appendage thrombus was detected.   4. Right atrial size was mildly dilated.   5. The mitral valve is normal in structure. Mild mitral valve  regurgitation.   6. Tricuspid valve regurgitation is moderate  to severe.   7. The aortic valve is tricuspid. Aortic valve regurgitation is mild.   Conclusion(s)/Recommendation(s): No evidence of vegetation/infective  endocarditis on this transesophageal  echocardiogram.   Patient Profile     85 y.o. male w/PMHx of congenital deafness, CAD s/p 1986 CABG as below, 2000 inferior STEMI with subsequent VT/ 2nd degree AVB /CHB, dual chamber PPM implantation 04/25/1998 due to CHB with change-out 2007, 2014 [employer] CRT-D due to EF 30% despite GDMT, multiple catheterizations / PCIs, PCI/BMS placed SVG-OM 2015, known CTO of the SVG-RCA, HFrEF with recovery of EF by  most recent  (55-60%, 08/2019), lymphedema, AAA and aortic root dilation, remote strokes as seen on CT imaging, permanent Afib not on [place] due to GIB, s/p transfusion / known AVMs / falls, anemia, CKDIII, prostate CA, HTN, HLD.  He has h/o recurrent group B streptococcus with recent 06/2019 admission due to sepsis with blood cultures positive for strep B sepsis. He was initially placed on broad-spectrum antibiotics, which were then consolidated to penicillin. TEE 06/2019 was performed and without evidence of vegetation on pacer wires or cardiac valves. He was IV diuresed and sent home on prolonged abx for 6-8 weeks.  Of note, ultrasound was performed of his left upper extremity due to LUE>RUE and without evidence of DVT.   He was readmitted to [facility] after near syncope 08/18/2019 and found to have recurrent group B streptococcus bacteremia. He noted a strange feeling of "coldness" in his chest, dizziness, and intermittent SOB with his near fall. During admission, he reported melena. Subsequent 08/22/2019 enteroscopy was without evidence of active GIB.  Recovery of EF noted in 08/2019 echo. TEE  08/23/2019 was without evidence of vegetation on device or cardiac valves.  Infectious disease recommendation was for extraction of device, given recurrent bacteremia despite prolonged course of antibiotics.  Case was thus discussed  with Dr. [name] of [facility] given multiple comorbid conditions and likely high risk extraction. EP reviewed the case with recommendation to transfer to [facility] secondary to class I indication for extraction of device  Assessment & Plan    Sepsis recurrent Group B streptococcus bacteremia Unknown source, of note, very poor dentition (will need dental evaluation) Afebrile with no leukocytosis BC X 2 done on 08/20/19 were negative repeat BC (x2) done on 08/26/19 after pacemaker removal negative x1 day (one bottle with inadequate volume) TEE was performed on 6/9 with no evidence of vegetations   ID signed off 08/27/19 noting: Continue with ceftriaxone through 09/22/19.  PICC line order placed for tomorrow for left arm placement (done) OPAT orders in IV HH orders under consult He will follow up with ID, Dr. [name] at [facility] Dr. [name].  Dental evaluation is in process 3D reconstruction is pending   CHB CRT-D system extraction 08/25/2019 Has temp-perm L IJ (dressing changed today) He also has a temp wire via R groin Will need to discuss timing of new implant Await dental evaluation and recommendations  Dr. [name] has seen and examined the patient this morning Pending denal dental recommendations, likely plans for new device Friday     Left base PNA Currently on RA, afebrile Initial CXR with L base PNA, repeat with worsening congestion gettinge IV Ceftriaxone Supplemental O2 as needed, duoneb prn, inhalers Continue as per IM service CXR today   Acute on chronic diastolic HF CXR as above Recent TEE with EF of 55-60% Getting intermittent lasix He is edematous, he reports this extent of edema a month or two now Plan to restart home torsemide, cozaar, aldactone once BP permits Strict I&O, daily weight Continue IV lasix today    LUE edema USS negative for DVT in 06/2019, repeat here showed no DVT, did find acute superficial vein  thrombosis Conservative management per IM Unable to receive AC due to GIB   Acute on chronic blood loss anemia with iron def Baseline hemoglobin between 7-8 S/P SB capsule endoscopy without any source of bleeding S/P IV iron, 2U PRBC (earlier in his stay) H/H stable from yesterday Defer to IM management, and transfusion if need DVT prophylaxis  held  SCDs ordered     Permanent Afib Rate controlled, paced rhythm Currently not on AC due to GIB   CAD/ CABG, DES/ HLD Remains Chest pain free Continue statin   HTN Relative hypotension here BP stable   CKD stage 3a Some mild AKI Creat stable   CT noted ? Nasal passage mass Will defer to medicine team thought and recommendations    For questions or updates, please contact [place] Please consult [email or web address] for contact info under     Signed, Baldwin Jamaica, PA-C  08/29/2019, 8:32 AM    EP Attending  Patient seen and examined. Agree with above. The patient remains stable today although he appears volume overloaded with peripheral edema. We will consider removing his temporary PM in the next day or two. He has undergone CT of the mouth and will be seen for consideration for tooth extraction/inspection.  Mikle Bosworth.D.

## 2019-08-29 NOTE — Progress Notes (Signed)
Patient with new onset right lower flank pain.  Patient grimaced in pain when RN repositioned patient in bed and then upon palpation.  When patient not moved or area not being touched, patient stated no pain however very strong reaction when area is touched by RN.   RN palpated left lower flank area and no reaction and patient stated no pain in that area.   Paged PA Charlcie Cradle.   PA Ursuy to bedside to assess patient.  Patient has skin tear and reddened area on right lower flank that is tender.  No signs of retroperitoneal bleed.

## 2019-08-29 NOTE — Progress Notes (Signed)
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 At [facility] patient was followed by both Dr. [name] with ID and  Dr. [name] with Cardiology. TEE was performed on 6/9 with no evidence of vegetations. Despite this finding, infectious disease recommended transfer to [facility] for removal of ICD considering recurrent group B strep bacteremia. During patient's hospital course, patient also was found to be anemic s/p 2U PRBC. Dr. [name] with GI was consulted who performed small bowel enteroscopy revealing no evidence of significant pathology in the duodenum, jejunum, esophagus or stomach. Pt transferred for further management.    Today, spoke to patient via interpreter service, denies any new complaints.    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 (HCC)   Acute renal failure superimposed on stage 3a chronic kidney disease (HCC)   Sepsis due to group B Streptococcus (HCC)   Duodenal arteriovenous malformation   Candidal  intertrigo   Edema of left upper extremity   Pneumonia of left lower lobe due to group B Streptococcus (HCC)   Septic shock likely 2/2 recurrent Group B streptococcus bacteremia Unknown source, of note, very poor dentition Currently afebrile, with no leukocytosis BC X 2 done on 08/20/19 showed NGTD, repeat done on 08/26/19 after pacemaker removal NGTD TEE was performed on 6/9 with no evidence of vegetations ID on board, rec IV ceftriaxone through 09/22/19 PICC line placed on 08/28/19 EP on board, s/p removal of AICD on 08/25/19, temp wire in place Dr [name], dentist on board, plan for dental caries teeth extraction in the OR on 08/30/19 Continue Rocephin Outpt follow up with ID, Dr [name] Monitor closely  Left base PNA Currently on RA, afebrile Initial CXR with L base PNA, repeat with worsening congestion Continue IV Ceftriaxone Supplemental O2 as needed, duoneb prn, inhalers  Acute on chronic systolic HF CXR as above Recent TEE with EF of 55-60% EP/cardiology on board, rec IV lasix prn Plan to restart home torsemide, cozaar, aldactone once BP permits On lasix bid by cardiology Strict I&O, daily weight  LUE edema USS negative for DVT in 06/2019, repeat showed acute superficial vein thrombosis Conservative management  Unable to receive AC due to GIB  Acute on chronic blood loss anemia with iron def Baseline hemoglobin between 7-8 S/P SB capsule endoscopy without any source of bleeding S/P IV iron, 2U PRBC Continue PPI, hold AC Daily CBC  Permanent Afib Rate controlled, paced rhythm Currently not on AC due to GIB Telemetry  CAD/ CABG, DES/ HLD Chest pain free Continue statin  HTN BP soft Restart  cozaar once able  CKD stage 3a Some mild AKI Stable at baseline Daily BMP        Malnutrition Type:      Malnutrition Characteristics:      Nutrition Interventions:       Estimated body mass index is 28.48 kg/m as calculated from the following:    Height as of this encounter: 6\' 2"  (1.88 m).   Weight as of this encounter: 100.6 kg.     Code Status: Full  Family Communication: None  Disposition Plan: Status is: Inpatient  Remains inpatient appropriate because:Inpatient level of care appropriate due to severity of illness   Dispo: The patient is from: Home              Anticipated d/c is to: Home              Anticipated d/c date is: 3 days              Patient currently is not medically stable to d/c.    Consultants:  EP/Cardiology  ID  GI  Procedures:  Removal of AICD  SB enteroscopy  Antimicrobials: IV ceftriaxone  DVT prophylaxis: Heparin   Objective: Vitals:   08/29/19 1600 08/29/19 1624 08/29/19 1700 08/29/19 1800  BP: (!) 115/58  114/61 (!) 155/55  Pulse: (!) 59  (!) 59 (!) 59  Resp: 20  (!) 21 20  Temp:  98.2 F (36.8 C)    TempSrc:  Oral    SpO2: 96%  96% 95%  Weight:      Height:        Intake/Output Summary (Last 24 hours) at 08/29/2019 1816 Last data filed at 08/29/2019 1600 Gross per 24 hour  Intake 943 ml  Output 885 ml  Net 58 ml   Filed Weights   08/25/19 0326 08/26/19 0450 08/29/19 0500  Weight: 96.1 kg 97.3 kg 100.6 kg    Exam:  General: NAD, deaf  Cardiovascular: S1, S2 present  Respiratory: Mild bibasilar crackles noted  Abdomen: Soft, nontender, nondistended, bowel sounds present  Musculoskeletal: 1+ bilateral pedal edema noted, chronic lymphedema, LUE edema  Skin: Rash noted around groin and buttocks  Psychiatry: Normal mood   Data Reviewed: CBC: Recent Labs  Lab 08/25/19 0947 08/26/19 0201 08/27/19 0505 08/28/19 0350 08/29/19 0337  WBC 5.2 6.8 7.8 6.2 6.6  NEUTROABS 3.5 6.0 5.9 4.7 5.1  HGB 8.7* 8.3* 8.1* 7.7* 7.7*  HCT 29.3* 28.5* 28.4* 26.6* 26.4*  MCV 83.0 84.8 86.3 87.2 86.3  PLT 203 192 220 194 128   Basic Metabolic Panel: Recent Labs  Lab 08/23/19 0448 08/23/19 0448 08/24/19 2100 08/24/19 2100 08/25/19 0947 08/26/19 0201  08/27/19 0505 08/28/19 0350 08/29/19 0337  NA 145   < > 141   < > 142 142 140 138 138  K 3.5   < > 3.5   < > 3.6 4.0 4.3 3.8 3.8  CL 111   < > 108   < > 108 108 107 106 103  CO2 29   < > 28   < > 27 25 26 27 27   GLUCOSE 86   < > 116*   < > 99 124* 100* 111* 98  BUN 20   < > 12   < > 11 12 20 20 17   CREATININE 0.88   < > 1.06   < > 0.99 1.13 1.32* 1.30* 1.10  CALCIUM 7.6*   < > 7.5*   < > 7.5* 7.2* 7.2* 7.6* 7.7*  MG 2.3  --  2.1  --   --  1.9  --   --   --   PHOS  --   --  2.3*  --   --   --   --   --   --    < > = values in this interval not displayed.   GFR: Estimated Creatinine Clearance: 65.6 mL/min (by C-G formula based on SCr of 1.1 mg/dL). Liver Function Tests: Recent Labs  Lab 08/24/19 2100  ALBUMIN 1.6*   No results for input(s): LIPASE, AMYLASE in the last 168 hours. No results for input(s): AMMONIA in the last 168 hours. Coagulation Profile: Recent Labs  Lab 08/24/19 2100  INR 1.3*   Cardiac Enzymes: No results for input(s): CKTOTAL, CKMB, CKMBINDEX, TROPONINI in the last 168 hours. BNP (last 3 results) No results for input(s): PROBNP in the last 8760 hours. HbA1C: No results for input(s): HGBA1C in the last 72 hours. CBG: Recent Labs  Lab 08/25/19 0518 08/26/19 0455  GLUCAP 93 114*   Lipid Profile: No results for input(s): CHOL, HDL, LDLCALC, TRIG, CHOLHDL, LDLDIRECT in the last 72 hours. Thyroid Function Tests: No results for input(s): TSH, T4TOTAL, FREET4, T3FREE, THYROIDAB in the last 72 hours. Anemia Panel: No results for input(s): VITAMINB12, FOLATE, FERRITIN, TIBC, IRON, RETICCTPCT in the last 72 hours. Urine analysis:    Component Value Date/Time   COLORURINE YELLOW (A) 08/20/2019 0924   APPEARANCEUR HAZY (A) 08/20/2019 0924   APPEARANCEUR Clear 12/13/2017 0918   LABSPEC 1.015 08/20/2019 0924   LABSPEC 1.021 11/19/2011 1501   PHURINE 5.0 08/20/2019 0924   GLUCOSEU NEGATIVE 08/20/2019 0924   GLUCOSEU Negative 11/19/2011 1501   HGBUR  NEGATIVE 08/20/2019 0924   BILIRUBINUR NEGATIVE 08/20/2019 0924   BILIRUBINUR Negative 12/13/2017 0918   BILIRUBINUR Negative 11/19/2011 1501   KETONESUR NEGATIVE 08/20/2019 0924   PROTEINUR NEGATIVE 08/20/2019 0924   NITRITE NEGATIVE 08/20/2019 0924   LEUKOCYTESUR NEGATIVE 08/20/2019 0924   LEUKOCYTESUR Negative 11/19/2011 1501   Sepsis Labs: @LABRCNTIP (procalcitonin:4,lacticidven:4)  ) Recent Results (from the past 240 hour(s))  Urine culture     Status: None   Collection Time: 08/20/19  9:24 AM   Specimen: Urine  Result Value Ref Range Status   Specimen Description   Final    IN/OUT CATH URINE Performed at [facility] [address], [place], [place] [address]    Special Requests   Final    NONE Performed at [facility], [address], [place], [place] [address]    Culture   Final    NO GROWTH Performed at [facility], [place] [address], [place], [place] [address]    Report Status 08/21/2019 FINAL  Final  C Difficile Quick Screen w PCR reflex     Status: None   Collection Time: 08/20/19 12:01 PM   Specimen: STOOL  Result Value Ref Range Status   C Diff antigen NEGATIVE NEGATIVE Final   C Diff toxin NEGATIVE NEGATIVE Final   C Diff interpretation No C. difficile detected.  Final    Comment: Performed at [facility], [address] [place],  [address]  CULTURE, BLOOD (ROUTINE X 2) w Reflex to ID Panel     Status: None   Collection Time: 08/20/19  1:07 PM   Specimen: BLOOD  Result Value Ref Range Status   Specimen Description BLOOD [facility]  Final   Special Requests   Final    BOTTLES DRAWN AEROBIC AND ANAEROBIC  Blood Culture adequate volume   Culture   Final    NO GROWTH 5 DAYS Performed at [facility], [address] [place], [place] [address]    Report Status 08/25/2019 FINAL  Final  CULTURE, BLOOD (ROUTINE X 2) w Reflex to ID Panel     Status: None   Collection Time: 08/20/19  1:22 PM   Specimen: BLOOD    Result Value Ref Range Status   Specimen Description BLOOD [facility]  Final   Special Requests   Final    BOTTLES DRAWN AEROBIC AND ANAEROBIC Blood Culture adequate volume   Culture   Final    NO GROWTH 5 DAYS Performed at [facility], [address], [place], [place] [address]    Report Status 08/25/2019 FINAL  Final  Surgical PCR screen     Status: None   Collection Time: 08/25/19 11:53 AM   Specimen: Nasal Mucosa; Nasal Swab  Result Value Ref Range Status   MRSA, PCR NEGATIVE NEGATIVE Final   Staphylococcus aureus NEGATIVE NEGATIVE Final    Comment: (NOTE) The Xpert SA Assay (FDA approved for NASAL specimens in patients 42 years of age and older), is one component of a comprehensive surveillance program. It is not intended to diagnose infection nor to guide or monitor treatment. Performed at [facility], [place] [address], [place], [place] [address]   Culture, blood (routine x 2)     Status: None (Preliminary result)   Collection Time: 08/26/19  6:26 AM   Specimen: BLOOD LEFT HAND  Result Value Ref Range Status   Specimen Description BLOOD LEFT HAND  Final   Special Requests   Final    BOTTLES DRAWN AEROBIC AND ANAEROBIC Blood Culture results may not be optimal due to an inadequate volume of blood received in culture bottles   Culture   Final    NO GROWTH 3 DAYS Performed at [facility], [place] [address], [place], [place] [address]    Report Status PENDING  Incomplete  Culture, blood (routine x 2)     Status: None (Preliminary result)   Collection Time: 08/26/19  6:26 AM   Specimen: BLOOD  Result Value Ref Range Status   Specimen Description BLOOD LEFT ARM  Final   Special Requests   Final    BOTTLES DRAWN AEROBIC AND ANAEROBIC Blood Culture adequate volume   Culture   Final    NO GROWTH 3 DAYS Performed at [facility], [address], [place], [place] [address]    Report Status PENDING  Incomplete      Studies: CT 3D RECON AT  [facility]  Result Date: 08/29/2019 CLINICAL DATA:  Nonspecific (abnormal) findings on radiological and other examination of musculoskeletal system. EXAM: 3-DIMENSIONAL CT IMAGE RENDERING ON ACQUISITION WORKSTATION TECHNIQUE: 3-dimensional CT images were rendered by post-processing of the original CT data on an acquisition workstation. The 3-dimensional CT images were interpreted and findings were reported in the accompanying complete CT report for this study COMPARISON:  None. FINDINGS: Refer to  CT maxillofacial 08/28/2019 IMPRESSION: Refer to CT maxillofacial 08/28/2019 Electronically Signed   By: Franchot Gallo M.D.   On: 08/29/2019 09:16   CT MAXILLOFACIAL W CONTRAST  Result Date: 08/28/2019 CLINICAL DATA:  Endocarditis suspected.  Rule out dental infection. EXAM: CT MAXILLOFACIAL WITH CONTRAST TECHNIQUE: Multidetector CT imaging of the maxillofacial structures was performed with intravenous contrast. Multiplanar CT image reconstructions were also generated. CONTRAST:  37mL OMNIPAQUE IOHEXOL 300 MG/ML  SOLN COMPARISON:  None. FINDINGS: Osseous: No facial fracture.  No acute skeletal abnormality. Numerous caries are present bilaterally. Extensive destruction of the right upper third molar with periapical lucency. Orbits: No orbital mass or edema.  Bilateral cataract extraction. Sinuses: Soft tissue masslike lesion in the upper nasal cavity measuring approximately 22 x 14 mm. This is on both sides of the septum. No destruction of the septum. Soft tissues: No soft tissue mass or adenopathy in the area of interest. Limited intracranial: Generalized atrophy. No acute intracranial abnormality. IMPRESSION: 1. Numerous caries bilaterally. Right upper third molar caries with periapical lucency. 2. Soft tissue mass in the upper nasal cavity bilaterally. Recommend direct visualization and biopsy if appropriate. Electronically Signed   By: Franchot Gallo M.D.   On: 08/28/2019 20:09   DG CHEST PORT 1 VIEW  Result  Date: 08/29/2019 CLINICAL DATA:  85 year old male status post ICD extraction. EXAM: PORTABLE CHEST 1 VIEW COMPARISON:  Chest x-ray 08/28/2019. FINDINGS: Left costophrenic sulcus incompletely imaged. Lung volumes are slightly low. Opacities are noted throughout the mid to lower lungs bilaterally, which may reflect areas of atelectasis and/or consolidation. Small bilateral pleural effusions. Cephalization of the pulmonary vasculature. Mild cardiomegaly. Upper mediastinal contours are distorted by patient's rotation to the left. Aortic atherosclerosis. Status post median sternotomy. Left-sided pacemaker device in place with lead tip projecting over the expected location of the right ventricle. Additional lead extending into the right-side of the heart via the inferior vena cava. IMPRESSION: 1. Postoperative changes and support apparatus, as above. 2. Persistent areas of atelectasis and/or consolidation throughout the mid to lower lungs bilaterally with small bilateral pleural effusions. 3. Cardiomegaly. Electronically Signed   By: Vinnie Langton M.D.   On: 08/29/2019 10:42    Scheduled Meds: . sodium chloride   Intravenous Once  . Chlorhexidine Gluconate Cloth  6 each Topical Daily  . ferrous sulfate  325 mg Oral Daily  . furosemide  40 mg Intravenous BID  . Gerhardt's butt cream   Topical BID  . heparin injection (subcutaneous)  5,000 Units Subcutaneous Q8H  . multivitamin with minerals  1 tablet Oral Daily  . pantoprazole  40 mg Intravenous Q12H  . simvastatin  20 mg Oral q1800  . sodium chloride flush  3 mL Intravenous Q12H    Continuous Infusions: . cefTRIAXone (ROCEPHIN) IVPB 2 gram/100 mL NS (Mini-Bag Plus) Stopped (08/28/19 2351)     LOS: 5 days     Alma Friendly, MD [facility]  If 7PM-7AM, please contact night-coverage [email or web address] 08/29/2019, 6:16 PM

## 2019-08-29 NOTE — Telephone Encounter (Signed)
Called and got home answering service and LM to have patient call us to schedule hosp f/u in 2 weeks.

## 2019-08-29 NOTE — Consult Note (Signed)
DENTAL CONSULTATION  Date of Consultation:  08/29/2019 Patient Name:   Thomas [name] Date of Birth:   [birth year] Medical Record Number: [id number]   Lab Results  Component Value Date   SARSCOV2NAA NEGATIVE 08/18/2019   SARSCOV2NAA NEGATIVE 06/16/2019   [place] NEGATIVE 05/19/2019   [place] NEGATIVE 09/13/2018   VITALS: BP (!) 110/59   Pulse (!) 59   Temp 98.3 F (36.8 C) (Oral)   Resp (!) 22   Ht 6\' 2"  (1.88 m)   Wt 100.6 kg   SpO2 94%   BMI 28.48 kg/m   CHIEF COMPLAINT: Patient referred by Dr. [name] for dental consultation.  HPI: Thomas [name] is an 85 year old male recently admitted with group B streptococcal bacteremia.  The ICD leads were removed by Dr. [name] as a potential source of the infection.  Dental consultation was then requested to evaluate dentition as a possible source of the infection and bacteremia.  The patient currently denies acute toothaches, swellings, or abscesses.  Patient is aware that he has a broken tooth in the upper right quadrant.  Patient points to the retained root segments in the area of #2.  Patient has not seen a dentist in over 10 years.  Patient cannot provide the name of the dentist that he saw.  Patient denies having partial dentures.  Patient denies having dental phobia.    PROBLEM LIST: Patient Active Problem List   Diagnosis Date Noted  . 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 Candidal intertrigo 08/24/2019  . Edema of left upper extremity 08/24/2019  . Pneumonia of left lower lobe due to group B Streptococcus ([place]) 08/24/2019  . Heart block AV complete ([place]) 08/23/2019  . Sepsis due to group B Streptococcus ([place]) 08/20/2019  . Acute blood loss anemia 08/20/2019  . HCAP (healthcare-associated pneumonia) 08/18/2019  . Severe sepsis with septic shock ([place]) 08/18/2019  . Acute renal failure superimposed on stage 3a chronic kidney disease ([place]) 08/18/2019  . Black stool 08/18/2019  . Iron deficiency anemia  08/18/2019  . Group B streptococcal bacteriuria 06/18/2019  . Lactic acidosis 06/18/2019  . Pressure ulcer, stage 2 ([place]) 06/18/2019  . Bacteremia due to group B Streptococcus 06/17/2019  . SIRS (systemic inflammatory response syndrome) ([place]) 06/16/2019  . Leg swelling   . Decompensated hepatic cirrhosis ([place]) 05/22/2019  . Anasarca 05/19/2019  . CRI (chronic renal insufficiency), stage 3 (moderate) 01/30/2019  . Iron deficiency anemia due to chronic blood loss 11/02/2018  . Coronary artery disease of bypass graft of native heart with stable angina pectoris ([place]) 11/02/2018  . Pure hypercholesterolemia 11/02/2018  . Biventricular automatic implantable cardioverter defibrillator in situ 11/02/2018  . GIB (gastrointestinal bleeding) 09/14/2018  . Hypotension 08/14/2018  . Chronic systolic CHF (congestive heart failure) ([place]) 08/12/2018  . Acute on chronic combined systolic and diastolic CHF (congestive heart failure) ([place])   . GI bleed 05/26/2018  . Occult GI bleeding 05/25/2018  . Normocytic anemia 05/25/2018  . Elevated troponin 05/25/2018  . Lymphedema 02/28/2018  . Weakness 07/15/2016  . Fatigue 07/15/2016  . Stroke (cerebrum) ([place]) 10/31/2015  . Bulbous urethral stricture 09/18/2015  . Bilateral deafness 08/19/2015  . Bilateral cataracts 08/19/2015  . Acid reflux 08/19/2015  . Mixed hyperlipidemia 08/19/2015  . Essential hypertension 08/19/2015  . Myocardial infarction ([place]) 08/19/2015  . Calculus of kidney 08/19/2015  . Pacemaker 08/19/2015  . Gastroduodenal ulcer 08/19/2015  . Dupuytren's contracture of foot 08/19/2015  . Malignant neoplasm of prostate ([place]) 08/19/2015  . Microhematuria 08/19/2015  .  CAD S/P multiple PCIs 02/22/2015  . Status post coronary artery bypass grafting 02/22/2015  . Benign essential HTN 04/02/2014  . Hematochezia 02/20/2014  . Tricuspid valve regurgitation, nonrheumatic 02/20/2014  . Mobitz type II atrioventricular block 12/12/2013  . Long  term current use of anticoagulant 10/18/2013  . Cardiomyopathy, ischemic 11/11/2012  . Permanent atrial fibrillation ([place]) 11/11/2012  . Biventricular ICD in place 11/11/2012  . Chronic combined systolic and diastolic heart failure ([place]) 11/11/2012    PMH: Past Medical History:  Diagnosis Date  . AICD (automatic cardioverter/defibrillator) present   . Bacteremia due to group B Streptococcus    Recurrent admissions for group B Strepotococcus bacteremia of unknown source with TEEs negative for vegetation 06/2019 and 08/2019  . Biventricular ICD (implantable cardioverter-defibrillator) in place 03/24/2005   Implantation of a Medtronic Adapta ADDRO1, serial number [id number]  . CHF (congestive heart failure) ([place])   . CKD (chronic kidney disease), stage III   . Coronary artery disease    a. s/p CABG 1986. b. Multiple PCIs/caths. c. 09/2013: s/p PTCA and BMS to SVG-OM.  [name]    Requires sign language interpreter  . Dysrhythmia   . History of abdominal aortic aneurysm   . History of bleeding peptic ulcer 1980  . History of epididymitis 2013  . HTN (hypertension)   . Hydronephrosis with ureteropelvic junction obstruction   . Hydroureter on left 2009  . Hypertension   . Ischemic cardiomyopathy    a. Prior EF 30-35%, s/p BIV-ICD. b. 09/2013: EF 45-50%.  . Moderate tricuspid regurgitation   . PAF (paroxysmal atrial fibrillation) (HCC)    Not on [place] 2/2 GIB  . Presence of permanent cardiac pacemaker 2002   Original placed in 2002 for CHB then 2007 and 2014 device change out  . Prostate cancer ([place])   . Status post coronary artery bypass grafting 1986   LIMA to the LAD, SVG to OM, SVG to RCA  . Testicular swelling     PSH: Past Surgical History:  Procedure Laterality Date  . 2-D echocardiogram  11/20/2011   Ejection fraction 30-35% moderate concentric left ventricular hypertrophy. Left atrium is moderately dilated. Mild MR. Mild or  . BI-VENTRICULAR IMPLANTABLE CARDIOVERTER  DEFIBRILLATOR N/A 12/16/2012   Procedure: BI-VENTRICULAR IMPLANTABLE CARDIOVERTER DEFIBRILLATOR  (CRT-D);  Surgeon: [name], MD;  Location: [facility] CATH LAB;  Service: Cardiovascular;  Laterality: N/A;  . CARDIAC CATHETERIZATION  12/10/2011   SVG to OM widely patent.  LIMA to LAD patent  . CATARACT EXTRACTION W/PHACO Right 10/12/2017   Procedure: CATARACT EXTRACTION PHACO AND INTRAOCULAR LENS PLACEMENT (IOC);  Surgeon: Birder Robson, MD;  Location: [facility] ORS;  Service: Ophthalmology;  Laterality: Right;  [place] 00:57 AP% 15.9 CDE 9.07 Fluid pack lot # [id number]  . COLONOSCOPY N/A 07/13/2018   Procedure: COLONOSCOPY;  Surgeon: Toledo, Benay Pike, MD;  Location: [facility] ENDOSCOPY;  Service: Gastroenterology;  Laterality: N/A;  . CORONARY ARTERY BYPASS GRAFT  1986  . ENTEROSCOPY N/A 09/14/2018   Procedure: ENTEROSCOPY;  Surgeon: Toledo, Benay Pike, MD;  Location: [facility] ENDOSCOPY;  Service: Gastroenterology;  Laterality: N/A;  symptomatic anemia, GI blood loss anemia, melena, positive small bowel capsule endoscopy showing source of bleeding   . ENTEROSCOPY N/A 08/22/2019   Procedure: ENTEROSCOPY;  Surgeon: Lin Landsman, MD;  Location: [facility] ENDOSCOPY;  Service: Gastroenterology;  Laterality: N/A;  . ESOPHAGOGASTRODUODENOSCOPY N/A 07/13/2018   Procedure: ESOPHAGOGASTRODUODENOSCOPY (EGD);  Surgeon: Toledo, Benay Pike, MD;  Location: [facility] ENDOSCOPY;  Service: Gastroenterology;  Laterality: N/A;  .  ESOPHAGOGASTRODUODENOSCOPY N/A 09/14/2018   Procedure: ESOPHAGOGASTRODUODENOSCOPY (EGD);  Surgeon: Toledo, Benay Pike, MD;  Location: [facility] ENDOSCOPY;  Service: Gastroenterology;  Laterality: N/A;  SIGN LANAGUAGE INTERPRETER  . ESOPHAGOGASTRODUODENOSCOPY N/A 06/21/2019   Procedure: ESOPHAGOGASTRODUODENOSCOPY (EGD);  Surgeon: Toledo, Benay Pike, MD;  Location: [facility] ENDOSCOPY;  Service: Gastroenterology;  Laterality: N/A;  . ESOPHAGOGASTRODUODENOSCOPY (EGD) WITH PROPOFOL N/A 05/27/2018   Procedure: ESOPHAGOGASTRODUODENOSCOPY  (EGD) WITH PROPOFOL;  Surgeon: Clarene Essex, MD;  Location: [place];  Service: Endoscopy;  Laterality: N/A;  . ICD LEAD REMOVAL Left 08/25/2019   Procedure: ICD LEAD EXTRACTION;  Surgeon: [name], MD;  Location: [place];  Service: Cardiovascular;  Laterality: Left;  DR. Roxan Hockey BACK UP  . INSERT / REPLACE / REMOVE PACEMAKER    . LEFT HEART CATHETERIZATION WITH CORONARY/GRAFT ANGIOGRAM N/A 12/10/2011   Procedure: LEFT HEART CATHETERIZATION WITH Beatrix Fetters;  Surgeon: Sanda Klein, MD;  Location: [place] CATH LAB;  Service: Cardiovascular;  Laterality: N/A;  . LEFT HEART CATHETERIZATION WITH CORONARY/GRAFT ANGIOGRAM N/A 09/25/2013   Procedure: LEFT HEART CATHETERIZATION WITH Beatrix Fetters;  Surgeon: Blane Ohara, MD;  Location: [facility] CATH LAB;  Service: Cardiovascular;  Laterality: N/A;  . Persantine Myoview  05/06/2010   Post-rest ejection fraction 30%. No significant ischemia demonstrated. Compared to previous study there is no significant change.  . TEE WITHOUT CARDIOVERSION N/A 06/22/2019   Procedure: TRANSESOPHAGEAL ECHOCARDIOGRAM (TEE);  Surgeon: Minna Merritts, MD;  Location: [facility] ORS;  Service: Cardiovascular;  Laterality: N/A;  . TEE WITHOUT CARDIOVERSION N/A 08/23/2019   Procedure: TRANSESOPHAGEAL ECHOCARDIOGRAM (TEE);  Surgeon: Kate Sable, MD;  Location: [facility] ORS;  Service: Cardiovascular;  Laterality: N/A;  . TEE WITHOUT CARDIOVERSION N/A 08/25/2019   Procedure: TRANSESOPHAGEAL ECHOCARDIOGRAM (TEE);  Surgeon: [name], MD;  Location: [facility] OR;  Service: Cardiovascular;  Laterality: N/A;  . TRANSURETHRAL RESECTION OF PROSTATE     s/p    ALLERGIES: Allergies  Allergen Reactions  . Entresto [Sacubitril-Valsartan] Swelling    And bruising of arm  . Phenazopyridine Nausea Only and Other (See Comments)    GI UPSET  . Ramipril Other (See Comments)    unk Other reaction(s): Other (See Comments), Unknown unk    MEDICATIONS: Current  Facility-Administered Medications  Medication Dose Route Frequency Provider Last Rate Last Admin  . 0.9 %  sodium chloride infusion (Manually program via Guardrails IV Fluids)   Intravenous Once [name], MD      . acetaminophen (TYLENOL) tablet 325-650 mg  325-650 mg Oral Q4H PRN [name], MD   650 mg at 08/28/19 1042  . acetaminophen (TYLENOL) tablet 650 mg  650 mg Oral Q6H PRN [name], MD   650 mg at 08/26/19 0505  . albuterol (PROVENTIL) (2.5 MG/3ML) 0.083% nebulizer solution 3 mL  3 mL Inhalation Q4H PRN [name], MD      . cefTRIAXone (ROCEPHIN) 2 g in sodium chloride 0.9 % 100 mL IVPB  2 g Intravenous Q24H [name], MD   Stopped at 08/28/19 2351  . Chlorhexidine Gluconate Cloth 2 % PADS 6 each  6 each Topical Daily Sherren Mocha, MD   6 each at 08/28/19 1215  . dextromethorphan-guaiFENesin (MUCINEX DM) 30-600 MG per 12 hr tablet 1 tablet  1 tablet Oral BID PRN [name], MD   1 tablet at 08/26/19 0505  . ferrous sulfate tablet 325 mg  325 mg Oral Daily [name], MD   325 mg at 08/28/19 1044  . furosemide (  LASIX) injection 40 mg  40 mg Intravenous BID Baldwin Jamaica, PA-C      . Gerhardt's butt cream   Topical BID [name], MD   Given at 08/28/19 2217  . multivitamin with minerals tablet 1 tablet  1 tablet Oral Daily [name], MD   1 tablet at 08/28/19 1044  . ondansetron (ZOFRAN) injection 4 mg  4 mg Intravenous Q6H PRN [name], MD      . pantoprazole (PROTONIX) injection 40 mg  40 mg Intravenous Q12H [name], MD   40 mg at 08/28/19 2217  . simvastatin (ZOCOR) tablet 20 mg  20 mg Oral q1800 [name], MD   20 mg at 08/28/19 1655  . sodium chloride flush (NS) 0.9 % injection 10-40 mL  10-40 mL Intracatheter PRN Thayer Headings, MD      . sodium chloride flush (NS) 0.9 % injection 3 mL  3 mL Intravenous Q12H [name], MD   3 mL at 08/28/19 2216    LABS: Lab Results  Component Value Date   WBC  6.6 08/29/2019   HGB 7.7 (L) 08/29/2019   HCT 26.4 (L) 08/29/2019   MCV 86.3 08/29/2019   PLT 207 08/29/2019      Component Value Date/Time   NA 138 08/29/2019 0337   NA 146 (H) 03/28/2018 1625   NA 140 02/14/2014 0546   K 3.8 08/29/2019 0337   K 4.1 02/14/2014 0546   CL 103 08/29/2019 0337   CL 107 02/14/2014 0546   CO2 27 08/29/2019 0337   CO2 25 02/14/2014 0546   GLUCOSE 98 08/29/2019 0337   GLUCOSE 103 (H) 02/14/2014 0546   BUN 17 08/29/2019 0337   BUN 33 (H) 03/28/2018 1625   BUN 30 (H) 02/14/2014 0546   CREATININE 1.10 08/29/2019 0337   CREATININE 1.24 02/14/2014 0546   CALCIUM 7.7 (L) 08/29/2019 0337   CALCIUM 9.6 02/14/2014 0546   GFRNONAA >60 08/29/2019 0337   GFRNONAA >60 02/14/2014 0546   GFRNONAA 41 (L) 09/14/2012 2047   GFRAA >60 08/29/2019 0337   GFRAA >60 02/14/2014 0546   GFRAA 47 (L) 09/14/2012 2047   Lab Results  Component Value Date   INR 1.3 (H) 08/24/2019   INR 1.6 (H) 08/18/2019   INR 1.4 (H) 06/16/2019   No results found for: PTT  SOCIAL HISTORY: Social History   Socioeconomic History  . Marital status: Married    Spouse name: Not on file  . Number of children: Not on file  . Years of education: Not on file  . Highest education level: Not on file  Occupational History  . Not on file  Tobacco Use  . Smoking status: Former Smoker    Quit date: 03/15/1985    Years since quitting: 34.4  . Smokeless tobacco: Never Used  Vaping Use  . Vaping Use: Never used  Substance and Sexual Activity  . Alcohol use: No    Comment: occas.  . Drug use: No  . Sexual activity: Yes  Other Topics Concern  . Not on file  Social History Narrative  . Not on file   Social Determinants of Health   Financial Resource Strain: Low Risk   . Difficulty of Paying Living Expenses: Not hard at all  Food Insecurity: No Food Insecurity  . Worried About Charity fundraiser in the Last Year: Never true  . Ran Out of Food in the Last Year: Never true  Transportation Needs: No Transportation Needs  . Lack of Transportation (Medical): No  . Lack of Transportation (Non-Medical): No  Physical Activity: Inactive  . Days of Exercise per Week: 0 days  . Minutes of Exercise per Session: 0 min  Stress: No Stress Concern Present  . Feeling of Stress : Not at all  Social Connections: Moderately Integrated  . Frequency of Communication with Friends and Family: Never  . Frequency of Social Gatherings with Friends and Family: Never  . Attends Religious Services: 1 to 4 times per year  . Active Member of Clubs or Organizations: Yes  . Attends Archivist Meetings: 1 to 4 times per year  . Marital Status: Married  Human resources officer Violence: Not At Risk  . Fear of Current or Ex-Partner: No  . Emotionally Abused: No  . Physically Abused: No  . Sexually Abused: No    FAMILY HISTORY: Family History  Problem Relation Age of Onset  . Hypertension Father     REVIEW OF SYSTEMS: Reviewed with the patient as per History of present illness. Psych: Patient denies having dental phobia.  DENTAL HISTORY: CHIEF COMPLAINT: Patient referred by Dr. [name] for dental consultation.  HPI: Thomas [name] is an 85 year old male recently admitted with group B streptococcal bacteremia.  The ICD leads were removed by Dr. [name] as a potential source of the infection.  Dental consultation was then requested to evaluate dentition as a possible source of the infection and bacteremia.  The patient currently denies acute toothaches, swellings, or abscesses.  Patient is aware that he has a broken tooth in the upper right quadrant.  Patient points to the retained root segments in the area of #2.  Patient has not seen a dentist in over 10 years.  Patient cannot provide the name of the dentist that he saw.  Patient denies having partial dentures.  Patient denies having dental phobia.    DENTAL EXAMINATION: GENERAL: The patient is a well-developed, well-nourished  male laying in a hospital bed. HEAD AND NECK: There is no palpable neck lymphadenopathy.  The patient denies acute TMJ symptoms.  The patient has normal opening. INTRAORAL EXAM: Patient has normal saliva.  There is no evidence of oral abscess formation. DENTITION: Patient with multiple missing teeth.  Patient has retained root segments in the area of tooth #2.  Patient has significant amounts of excessive maxillary and mandibular incisal attrition. PERIODONTAL: Patient has chronic periodontitis with plaque and calculus accumulations, gingival recession, but no significant tooth mobility. DENTAL CARIES/SUBOPTIMAL RESTORATIONS: There are dental caries associated with retained root segment #2. ENDODONTIC: Patient has periapical pathology associated with retained root segment #2. CROWN AND BRIDGE: There are no crown or bridge restorations PROSTHODONTIC: Patient indicates that he does not have partial dentures. OCCLUSION: Patient has a poor occlusal scheme secondary to multiple missing teeth, retained root segment #2, super eruption and drifting of the unopposed teeth and edentulous areas, and lack replacement of the missing teeth with dental prostheses.    RADIOGRAPHIC INTERPRETATION: Patient was unable to stand for the orthopantogram. A CT scan was obtained with 3D reconstruction. There are multiple missing teeth numbers 1, 5, 12, 13, 14, 16, 17 through 19, 29, and 32.  There are retained root segments in the area of tooth #2.  There is moderate bone loss noted.  There is probable periapical radiolucency associated with retained root segment #2.  ASSESSMENTS: 1.  Streptococcal B bacteremia 2.  Chronic apical periodontitis 3.  Retained root segment #2 4.  Chronic periodontitis with bone loss 5.  Gingival recession 7.  Accretions 8.  Excessive maxillary and mandibular incisal attrition 9.  No history of acute pulpitis symptoms 10. Multiple missing teeth 11.  Poor occlusal scheme and  malocclusion 12.  Risk for complications up to and including death due to his overall cardiovascular compromise.   PLAN/RECOMMENDATIONS: 1. I discussed the risks, benefits, and complications of various treatment options with the patient in relationship to his medical and dental conditions, streptococcal B bacteremia, and risk for infection for future ICD placement if dental infection is not treated.  We discussed various treatment options to include no treatment, extraction of indicated teeth with alveoloplasty, periodontal therapy,  and need for follow up evaluation for dental restorations, root canal therapy, crown and bridge therapy, implant therapy, and replacement of missing teeth as indicated. The patient currently wishes to proceed with extraction of indicated teeth with alveoloplasty and gross debridement of  remaining dentition in the operating room with general anesthesia.  Patient understands that retained root segment #2 will be removed but he will be evaluated in the operating room under anesthesia to determine if additional teeth need to be extracted at this time.  Dr. [name] was contacted and indicates patient is cleared for this dental surgery at this time and agrees with the use of general anesthesia.   2. Discussion of findings with medical team and coordination of future medical and dental care as needed.     [name], DDS

## 2019-08-29 NOTE — Progress Notes (Signed)
OT Cancellation Note  Patient Details Name: Thomas [name] MRN: [id number] DOB: Jun 21, 1934   Cancelled Treatment:    Reason Eval/Treat Not Completed: Medical issues which prohibited therapy; (pt with maintained femoral sheath and not yet appropriate). Will follow.  Lou Cal, OT Acute Rehabilitation Services Pager [phone] Office [phone]   Thomas [name] Band 08/29/2019, 7:49 AM

## 2019-08-29 NOTE — Progress Notes (Signed)
PT Cancellation Note  Patient Details Name: Thomas [name] MRN: [id number] DOB: 1935/03/08   Cancelled Treatment:    Reason Eval/Treat Not Completed: Patient not medically ready (pt with maintained femoral sheath and not yet appropriate)   [name] B Claribel Sachs 08/29/2019, 7:46 AM  Bayard Males, PT Acute Rehabilitation Services Pager: [phone] Office: [phone]

## 2019-08-30 ENCOUNTER — Inpatient Hospital Stay (HOSPITAL_COMMUNITY): Payer: PPO

## 2019-08-30 ENCOUNTER — Telehealth: Payer: Self-pay

## 2019-08-30 ENCOUNTER — Telehealth: Payer: Self-pay | Admitting: Internal Medicine

## 2019-08-30 ENCOUNTER — Encounter (HOSPITAL_COMMUNITY)
Admission: AD | Disposition: A | Discharge status: Skilled Nursing Facility | Payer: Self-pay | Source: Other Acute Inpatient Hospital | Attending: Internal Medicine

## 2019-08-30 HISTORY — PX: MULTIPLE EXTRACTIONS WITH ALVEOLOPLASTY: SHX5342

## 2019-08-30 LAB — BASIC METABOLIC PANEL
Anion gap: 8 (ref 5–15)
BUN: 16 mg/dL (ref 8–23)
CO2: 28 mmol/L (ref 22–32)
Calcium: 7.7 mg/dL — ABNORMAL LOW (ref 8.9–10.3)
Chloride: 103 mmol/L (ref 98–111)
Creatinine, Ser: 1.06 mg/dL (ref 0.61–1.24)
GFR calc Af Amer: >60 mL/min (ref >60)
GFR calc non Af Amer: >60 mL/min (ref >60)
Glucose, Bld: 96 mg/dL (ref 70–99)
Potassium: 3.4 mmol/L — ABNORMAL LOW (ref 3.5–5.1)
Sodium: 139 mmol/L (ref 135–145)

## 2019-08-30 LAB — CBC WITH DIFFERENTIAL/PLATELET
Abs Immature Granulocytes: 0.02 10*3/uL (ref 0.00–0.07)
Basophils Absolute: 0 10*3/uL (ref 0.0–0.1)
Basophils Relative: 1 %
Eosinophils Absolute: 0.1 10*3/uL (ref 0.0–0.5)
Eosinophils Relative: 2 %
HCT: 26 % — ABNORMAL LOW (ref 39.0–52.0)
Hemoglobin: 7.7 g/dL — ABNORMAL LOW (ref 13.0–17.0)
Immature Granulocytes: 0 %
Lymphocytes Relative: 15 %
Lymphs Abs: 1 10*3/uL (ref 0.7–4.0)
MCH: 24.9 pg — ABNORMAL LOW (ref 26.0–34.0)
MCHC: 29.6 g/dL — ABNORMAL LOW (ref 30.0–36.0)
MCV: 84.1 fL (ref 80.0–100.0)
Monocytes Absolute: 0.5 10*3/uL (ref 0.1–1.0)
Monocytes Relative: 8 %
Neutro Abs: 4.8 10*3/uL (ref 1.7–7.7)
Neutrophils Relative %: 74 %
Platelets: 198 10*3/uL (ref 150–400)
RBC: 3.09 MIL/uL — ABNORMAL LOW (ref 4.22–5.81)
RDW: 20.4 % — ABNORMAL HIGH (ref 11.5–15.5)
WBC: 6.5 10*3/uL (ref 4.0–10.5)
nRBC: 0 % (ref 0.0–0.2)

## 2019-08-30 LAB — GLUCOSE, CAPILLARY: Glucose-Capillary: 99 mg/dL (ref 70–99)

## 2019-08-30 SURGERY — MULTIPLE EXTRACTION WITH ALVEOLOPLASTY
Anesthesia: General | Site: Mouth | Laterality: Bilateral

## 2019-08-30 MED ORDER — FENTANYL CITRATE (PF) 100 MCG/2ML IJ SOLN
25.0000 ug | INTRAMUSCULAR | Status: DC | PRN
  Administered 2019-08-30: 50 ug via INTRAVENOUS

## 2019-08-30 MED ORDER — 0.9 % SODIUM CHLORIDE (POUR BTL) OPTIME
TOPICAL | Status: DC | PRN
  Administered 2019-08-30: 1000 mL

## 2019-08-30 MED ORDER — OXYMETAZOLINE HCL 0.05 % NA SOLN
NASAL | Status: AC
  Filled 2019-08-30: qty 30

## 2019-08-30 MED ORDER — FENTANYL CITRATE (PF) 100 MCG/2ML IJ SOLN
INTRAMUSCULAR | Status: AC
  Filled 2019-08-30: qty 2

## 2019-08-30 MED ORDER — THROMBIN (RECOMBINANT) 5000 UNITS EX SOLR
CUTANEOUS | Status: AC
  Filled 2019-08-30: qty 5000

## 2019-08-30 MED ORDER — PHENYLEPHRINE HCL-NACL 10-0.9 MG/250ML-% IV SOLN
INTRAVENOUS | Status: DC | PRN
Start: 2019-08-30 — End: 2019-08-30
  Administered 2019-08-30: 25 ug/min via INTRAVENOUS

## 2019-08-30 MED ORDER — LIDOCAINE-EPINEPHRINE 2 %-1:100000 IJ SOLN
INTRAMUSCULAR | Status: AC
  Filled 2019-08-30: qty 10.2

## 2019-08-30 MED ORDER — PHENYLEPHRINE HCL-NACL 10-0.9 MG/250ML-% IV SOLN
INTRAVENOUS | Status: AC
  Filled 2019-08-30: qty 250

## 2019-08-30 MED ORDER — LACTATED RINGERS IV SOLN: INTRAVENOUS | Status: DC | PRN

## 2019-08-30 MED ORDER — BUPIVACAINE-EPINEPHRINE 0.5% -1:200000 IJ SOLN
INTRAMUSCULAR | Status: DC | PRN
  Administered 2019-08-30: 3.4 mL

## 2019-08-30 MED ORDER — PROPOFOL 10 MG/ML IV BOLUS
INTRAVENOUS | Status: DC | PRN
  Administered 2019-08-30: 110 mg via INTRAVENOUS

## 2019-08-30 MED ORDER — DEXAMETHASONE SODIUM PHOSPHATE 10 MG/ML IJ SOLN
INTRAMUSCULAR | Status: DC | PRN
  Administered 2019-08-30: 4 mg via INTRAVENOUS

## 2019-08-30 MED ORDER — ONDANSETRON HCL 4 MG/2ML IJ SOLN: 4.0000 mg | Freq: Once | INTRAMUSCULAR | Status: DC | PRN

## 2019-08-30 MED ORDER — PHENYLEPHRINE HCL (PRESSORS) 10 MG/ML IV SOLN
INTRAVENOUS | Status: DC | PRN
Start: 2019-08-30 — End: 2019-08-30
  Administered 2019-08-30: 80 ug via INTRAVENOUS

## 2019-08-30 MED ORDER — SUGAMMADEX SODIUM 200 MG/2ML IV SOLN
INTRAVENOUS | Status: DC | PRN
  Administered 2019-08-30: 200 mg via INTRAVENOUS

## 2019-08-30 MED ORDER — ROCURONIUM BROMIDE 10 MG/ML (PF) SYRINGE
PREFILLED_SYRINGE | INTRAVENOUS | Status: DC | PRN
  Administered 2019-08-30: 80 mg via INTRAVENOUS

## 2019-08-30 MED ORDER — POTASSIUM CHLORIDE CRYS ER 20 MEQ PO TBCR: 40.0000 meq | EXTENDED_RELEASE_TABLET | Freq: Two times a day (BID) | ORAL | Status: DC

## 2019-08-30 MED ORDER — PROPOFOL 10 MG/ML IV BOLUS
INTRAVENOUS | Status: AC
  Filled 2019-08-30: qty 20

## 2019-08-30 MED ORDER — HYDROCODONE-ACETAMINOPHEN 5-325 MG PO TABS
1.0000 | ORAL_TABLET | Freq: Four times a day (QID) | ORAL | Status: DC | PRN
  Administered 2019-09-05: 2 via ORAL
  Filled 2019-08-30 (×2): qty 2

## 2019-08-30 MED ORDER — OXYCODONE HCL 5 MG PO TABS: 5.0000 mg | ORAL_TABLET | Freq: Once | ORAL | Status: DC | PRN

## 2019-08-30 MED ORDER — BUPIVACAINE-EPINEPHRINE (PF) 0.5% -1:200000 IJ SOLN
INTRAMUSCULAR | Status: AC
  Filled 2019-08-30: qty 3.6

## 2019-08-30 MED ORDER — FENTANYL CITRATE (PF) 100 MCG/2ML IJ SOLN
INTRAMUSCULAR | Status: DC | PRN
  Administered 2019-08-30: 150 ug via INTRAVENOUS

## 2019-08-30 MED ORDER — OXYCODONE HCL 5 MG/5ML PO SOLN: 5.0000 mg | Freq: Once | ORAL | Status: DC | PRN

## 2019-08-30 MED ORDER — AMINOCAPROIC ACID SOLUTION 5% (50 MG/ML)
10.0000 mL | ORAL | Status: AC
  Administered 2019-08-30 (×4): 10 mL via ORAL
  Filled 2019-08-30 (×2): qty 100

## 2019-08-30 MED ORDER — POTASSIUM CHLORIDE CRYS ER 20 MEQ PO TBCR
40.0000 meq | EXTENDED_RELEASE_TABLET | Freq: Two times a day (BID) | ORAL | Status: AC
  Administered 2019-08-30 (×2): 40 meq via ORAL
  Filled 2019-08-30 (×2): qty 2

## 2019-08-30 MED ORDER — LACTATED RINGERS IV SOLN: INTRAVENOUS | Status: DC

## 2019-08-30 MED ORDER — LIDOCAINE-EPINEPHRINE 2 %-1:100000 IJ SOLN
INTRAMUSCULAR | Status: DC | PRN
  Administered 2019-08-30: 3.4 mL

## 2019-08-30 MED ORDER — HEMOSTATIC AGENTS (NO CHARGE) OPTIME
TOPICAL | Status: DC | PRN
  Administered 2019-08-30: 1 via TOPICAL

## 2019-08-30 MED ORDER — AMINOCAPROIC ACID SOLUTION 5% (50 MG/ML)
ORAL | Status: DC | PRN
  Administered 2019-08-30: 10 mL via ORAL

## 2019-08-30 MED ORDER — FENTANYL CITRATE (PF) 250 MCG/5ML IJ SOLN
INTRAMUSCULAR | Status: AC
  Filled 2019-08-30: qty 5

## 2019-08-30 SURGICAL SUPPLY — 40 items
ALCOHOL 70% 16 OZ (MISCELLANEOUS) ×3 IMPLANT
ATTRACTOMAT 16X20 MAGNETIC DRP (DRAPES) ×3 IMPLANT
BANDAGE HEMOSTAT MRDH 4X4 STRL (MISCELLANEOUS) IMPLANT
BLADE SURG 15 STRL LF DISP TIS (BLADE) ×2 IMPLANT
BLADE SURG 15 STRL SS (BLADE) ×6
BNDG HEMOSTAT MRDH 4X4 STRL (MISCELLANEOUS)
COVER SURGICAL LIGHT HANDLE (MISCELLANEOUS) ×3 IMPLANT
COVER WAND RF STERILE (DRAPES) ×3 IMPLANT
GAUZE 4X4 16PLY RFD (DISPOSABLE) ×3 IMPLANT
GAUZE PACKING FOLDED 2  STR (GAUZE/BANDAGES/DRESSINGS) ×3
GAUZE PACKING FOLDED 2 STR (GAUZE/BANDAGES/DRESSINGS) ×1 IMPLANT
GLOVE BIO SURGEON STRL SZ 6.5 (GLOVE) ×2 IMPLANT
GLOVE BIO SURGEONS STRL SZ 6.5 (GLOVE) ×1
GLOVE SURG ORTHO 8.0 STRL STRW (GLOVE) ×3 IMPLANT
GOWN STRL REUS W/ TWL LRG LVL3 (GOWN DISPOSABLE) ×1 IMPLANT
GOWN STRL REUS W/TWL 2XL LVL3 (GOWN DISPOSABLE) ×3 IMPLANT
GOWN STRL REUS W/TWL LRG LVL3 (GOWN DISPOSABLE) ×3
HEMOSTAT SURGICEL 2X14 (HEMOSTASIS) IMPLANT
KIT BASIN OR (CUSTOM PROCEDURE TRAY) ×3 IMPLANT
KIT TURNOVER KIT B (KITS) ×3 IMPLANT
MANIFOLD NEPTUNE II (INSTRUMENTS) ×3 IMPLANT
NEEDLE BLUNT 16X1.5 OR ONLY (NEEDLE) ×3 IMPLANT
NEEDLE DENTAL 27 LONG (NEEDLE) ×3 IMPLANT
NS IRRIG 1000ML POUR BTL (IV SOLUTION) ×3 IMPLANT
PACK EENT II TURBAN DRAPE (CUSTOM PROCEDURE TRAY) ×3 IMPLANT
PAD ARMBOARD 7.5X6 YLW CONV (MISCELLANEOUS) ×3 IMPLANT
SPONGE SURGIFOAM ABS GEL 100 (HEMOSTASIS) IMPLANT
SPONGE SURGIFOAM ABS GEL 12-7 (HEMOSTASIS) IMPLANT
SPONGE SURGIFOAM ABS GEL SZ50 (HEMOSTASIS) ×3 IMPLANT
SUCTION FRAZIER HANDLE 10FR (MISCELLANEOUS) ×3
SUCTION TUBE FRAZIER 10FR DISP (MISCELLANEOUS) ×1 IMPLANT
SUT CHROMIC 3 0 PS 2 (SUTURE) ×9 IMPLANT
SUT CHROMIC 4 0 P 3 18 (SUTURE) IMPLANT
SYR 50ML SLIP (SYRINGE) ×3 IMPLANT
TOWEL GREEN STERILE (TOWEL DISPOSABLE) ×3 IMPLANT
TUBE CONNECTING 12'X1/4 (SUCTIONS) ×1
TUBE CONNECTING 12X1/4 (SUCTIONS) ×2 IMPLANT
WATER STERILE IRR 1000ML POUR (IV SOLUTION) ×3 IMPLANT
WATER TABLETS ICX (MISCELLANEOUS) ×3 IMPLANT
YANKAUER SUCT BULB TIP NO VENT (SUCTIONS) ×3 IMPLANT

## 2019-08-30 NOTE — Progress Notes (Addendum)
OT Cancellation Note  Patient Details Name: Thomas [name] MRN: [id number] DOB: May 05, 1934   Cancelled Treatment:    Reason Eval/Treat Not Completed: Patient at procedure or test/ unavailable (getting teeth pulled), RN also confirms R femoral sheath will likely not be removed today. Will follow up as able.  Lou Cal, OT Acute Rehabilitation Services Pager [phone] Office [phone]   Raymondo Band 08/30/2019, 7:41 AM

## 2019-08-30 NOTE — Op Note (Addendum)
OPERATIVE REPORT  Patient:            Thomas [name] Date of Birth:  1934-12-13 MRN:                [id number]   DATE OF PROCEDURE:  08/30/2019  PREOPERATIVE DIAGNOSES: 1.  Bacteremia 2.  Chronic apical periodontitis 3.  Dental caries 4.  Retained root segments 5.  Chronic periodontitis 6.  Accretions 7.  Loose teeth  POSTOPERATIVE DIAGNOSES: 1.  Bacteremia 2.  Chronic apical periodontitis 3.  Dental caries 4.  Retained root segments 5.  Chronic periodontitis 6.  Accretions 7.  Loose teeth  OPERATIONS: 1. Multiple extraction of tooth numbers 2, 28, and 31 2. 2 Quadrants of alveoloplasty 3. Gross debridement of remaining dentition   SURGEON: [name], DDS  ASSISTANT: Molli Posey (dental assistant)  ANESTHESIA: General anesthesia via oral endotracheal tube.  MEDICATIONS: 1. Ancef 2 g IV prior to invasive dental procedures. 2. Local anesthesia with a total utilization of 2 carpules each containing 34 mg of lidocaine with 0.017 mg of epinephrine as well as 2 carpules each containing 9 mg of bupivacaine with 0.009 mg of epinephrine.  SPECIMENS: There are 3 teeth that were discarded.  DRAINS: None  CULTURES: None  COMPLICATIONS: None  ESTIMATED BLOOD LOSS: 100 mLs.  INTRAVENOUS FLUIDS: 400 mLs of Lactated ringers solution.  INDICATIONS: The patient was recently diagnosed with bacteremia.   A medically necessary dental consultation was then requested to evaluate the teeth as the source of the bacteremia as well as to provide treatment as indicated.  The patient was examined and treatment planned for extraction of indicated teeth with alveoloplasty and gross debridement of remaining dentition in the operating room with general anesthesia.  This treatment plan was formulated to decrease the risks and complications associated with dental infection from affecting the patient's systemic health and future ICD placement.  OPERATIVE FINDINGS: Patient was  examined operating room number 16.  The teeth were identified for extraction.  Tooth #28 was noted to have significant mobility and extensive caries were associated with the distal buccal of tooth #31.  Therefore, tooth numbers 2, 28, 31 would be extracted at this time.  The patient was noted be affected by chronic apical periodontitis, dental caries, retained root segment, chronic periodontitis, accretions, and loose teeth.   DESCRIPTION OF PROCEDURE: Patient was brought to the main operating room number 16. Patient was then placed in the supine position on the operating table.  General anesthesia was then induced per the anesthesia team. The patient was then prepped and draped in the usual manner for dental medicine procedure. A timeout was performed. The patient was identified and procedures were verified. A throat pack was placed at this time. The oral cavity was then thoroughly examined with the findings noted above. The patient was then ready for dental medicine procedure as follows:  Local anesthesia was then administered sequentially with a total utilization of 2 carpules each containing 34 mg of lidocaine with 0.017 mg of epinephrine as well as 2 carpules  each containing 9 mg bupivacaine with 0.009 mg of epinephrine.  The Maxillary right quadrant was first approached. Anesthesia was then delivered utilizing infiltration with lidocaine with epinephrine. A #15 blade incision was then made from the maxillary right tuberosity and extended to the mesial of #3.  A  surgical flap was then carefully reflected. The retained root segments of tooth #2 were then elevated out without complication.  Alveoloplasty was then performed  using a rongeurs and bone file to help achieve primary closure.  The tissues were approximated and trimmed appropriately.  The surgical site was irrigated with sterile saline.  A piece of Surgifoam placed the extraction socket of tooth #2. The surgical site was then closed from the  maxillary tuberosity and extended the distal #3 utilizing 3-0 Chromic Gut suture in a continuous interrupted suture technique x1.    At this point in time, the mandibular right quadrant was approached. The patient was given a right inferior alveolar nerve block and long buccal nerve block utilizing the bupivacaine with epinephrine. Further infiltration was then achieved utilizing the lidocaine with epinephrine. A 15 blade incision was then made from the distal of number 32 and extended the mesial #30.  A surgical flap was then reflected appropriately.  Tooth numbers 28 and 31 were then subluxated with a series straight elevators.  Tooth #28 was removed with a 151 forceps without complications.  A surgical handpiece and bur and copious amounts of sterile water were then used to remove buccal and interseptal bone around tooth #31.  Tooth #31 was then subluxated.Tooth #31 was then removed with a 23 forceps without complications.  Alveoloplasty was then performed utilizing a rongeurs and bone file to help achieve primary closure. The tissues were approximated and trimmed appropriately. The surgical sites were then irrigated with copious amounts of sterile saline.  A piece of Surgifoam was placed in the extraction sockets of #28 and 31 appropriately to aid hemostasis.  The mandibular right surgical site was closed with a continuous interrupted suture technique from the distal #32 extended to the distal #30 .  The surgical area #28 was then closed with a figure of 8 suture technique and 3-0 chromic gut material.  The remaining dentition was then approached.  A sonic scaler was used to remove accretions.  A series of hand curettes were then used to remove accretions.  The sonic scaler was then again used to further refine the removal of accretions.  This completed the gross debridement procedure  At this point time, the entire mouth was irrigated with copious amounts of sterile saline. The patient was examined for  complications, seeing none, the dental medicine procedure was deemed to be complete. The throat pack was removed at this time. A series of 4 x 4 gauze moistened with Amicar 5% rinse were placed in the mouth to aid hemostasis. The patient was then handed over to the anesthesia team for final disposition. After an appropriate amount of time, the patient was extubated and taken to the postanesthsia care unit in good condition. All counts were correct for the dental medicine procedure.  The patient is to continue Amicar 5% rinses postoperatively.  Patient is to rinse with 10 mls every hour for the next 10 hours in a swish and spit manner.   [name], DDS.

## 2019-08-30 NOTE — Anesthesia Procedure Notes (Signed)
Procedure Name: Intubation Date/Time: 08/30/2019 8:48 AM Performed by: Oletta Lamas, CRNA Pre-anesthesia Checklist: Patient identified, Emergency Drugs available, Suction available and Patient being monitored Patient Re-evaluated:Patient Re-evaluated prior to induction Oxygen Delivery Method: Circle System Utilized Preoxygenation: Pre-oxygenation with 100% oxygen Induction Type: IV induction Ventilation: Mask ventilation without difficulty Laryngoscope Size: Miller and 2 Grade View: Grade I Tube type: Oral Number of attempts: 1 Airway Equipment and Method: Stylet Placement Confirmation: ETT inserted through vocal cords under direct vision,  positive ETCO2 and breath sounds checked- equal and bilateral Secured at: 22 cm Tube secured with: Tape Dental Injury: Teeth and Oropharynx as per pre-operative assessment

## 2019-08-30 NOTE — Anesthesia Preprocedure Evaluation (Signed)
Anesthesia Evaluation  Patient identified by MRN, date of birth, ID band Patient awake    Reviewed: Allergy & Precautions, NPO status , Patient's Chart, lab work & pertinent test results  Airway Mallampati: I  TM Distance: >3 FB Neck ROM: Full    Dental   Pulmonary former smoker,    Pulmonary exam normal        Cardiovascular hypertension, Pt. on medications + CAD, + Past MI and + CABG  Normal cardiovascular exam     Neuro/Psych CVA    GI/Hepatic GERD  Medicated and Controlled,  Endo/Other    Renal/GU Renal InsufficiencyRenal disease     Musculoskeletal   Abdominal   Peds  Hematology   Anesthesia Other Findings   Reproductive/Obstetrics                             Anesthesia Physical Anesthesia Plan  ASA: III  Anesthesia Plan: General   Post-op Pain Management:    Induction: Intravenous  PONV Risk Score and Plan:   Airway Management Planned: Nasal ETT  Additional Equipment:   Intra-op Plan:   Post-operative Plan: Extubation in OR  Informed Consent: I have reviewed the patients History and Physical, chart, labs and discussed the procedure including the risks, benefits and alternatives for the proposed anesthesia with the patient or authorized representative who has indicated his/her understanding and acceptance.       Plan Discussed with: CRNA and Surgeon  Anesthesia Plan Comments:         Anesthesia Quick Evaluation

## 2019-08-30 NOTE — Progress Notes (Signed)
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 on 6//2021 after presenting with weakness, presyncope found to have sepsis secondary to left lower lobe pneumonia and recurrent group B strep bacteremia. At [facility] patient was followed by both Dr. Tama High with ID and Dr. [name] with cardiology.  TEE was performed on 6/9 with no evidence of vegetation.  Despite this finding, infectious disease recommended transfer to [facility] for removal of ICD considering recurrent group B strep bacteremia.  During patient hospital course, patient also was found to be anemic status post 2 unit of packed red blood cells.  Dr. [name] with GI was consulted who performed a small bowel enteroscopy revealing no evidence of significant pathology in the duodenum, jejunum, esophagus or stomach.  On 08/25/2019 patient underwent extraction of the biventricular ICD system along with Menard right ventricular pacing lead and removal of the biventricular ICD with insertion of a new temp perm transvenous pacemaker.  Dr. Dorothyann Gibbs dentist was consulted for dental caries extraction.  Patient underwent procedure multiple extraction of tooth #2, 28, 31.  2 quadrants of alveoloplasty.  On 6/16.     Assessment & Plan:   Principal Problem:   Bacteremia due to group B Streptococcus Active Problems:   Cardiomyopathy, ischemic   Permanent atrial fibrillation (HCC)    Benign essential HTN   Mixed hyperlipidemia   Iron deficiency anemia due to chronic blood loss   Coronary artery disease of bypass graft of native heart with stable angina pectoris (HCC)   Acute renal failure superimposed on stage 3a chronic kidney disease (HCC)   Sepsis due to group B Streptococcus (HCC)   Duodenal arteriovenous malformation   Candidal intertrigo   Edema of left upper extremity   Pneumonia of left lower lobe due to group B Streptococcus (HCC)  1-septic shock likely secondary to group B streptococcus bacteremia Unknown source, but patient with very poor dentition. EEG on 6/9 no evidence of vegetation Blood cultures on 08/26/2019 left after pacemaker removal no growth to date. Patient underwent extraction of the pacemaker on 6/11, he currently has an temporary pacer. On IV Rocephin.  He needs IV antibiotics through 70/90/2021  2-Left base pneumonia: Currently on room air. Continue with IV ceftriaxone Nebulizer  3-Acute on chronic systolic heart failure: Recent TEE show ejection fraction 55% EP/cardiology on board on IV Lasix as needed Plan is to start torsemide, Cozaar and Aldactone once blood pressure stabilized  Left upper extremity edema: Ultrasound negative for DVT 06/2019.  Repeat showed acute superficial vein thrombosis  Acute on chronic blood loss anemia with iron deficiency: Status post capsule endoscopy without any source of bleeding Status post IV iron, 2 unit of packed red blood cell  Permanent A. fib: Rate control No anticoagulation due to GI bleed  CAD/CABG, DES Continue with statins  Hypertension Holding Cozaar due to blood pressure being soft  CKD stage IIIa: Monitor Hypokalemia: Replete orally  Pressure Injury 06/21/19 Buttocks Mid Stage 2 -  Partial  thickness loss of dermis presenting as a shallow open injury with a red, pink wound bed without slough. (Active)  06/21/19 0700  Location: Buttocks  Location Orientation: Mid  Staging:  Stage 2 -  Partial thickness loss of dermis presenting as a shallow open injury with a red, pink wound bed without slough.  Wound Description (Comments):   Present on Admission:                    Estimated body mass index is 27.06 kg/m as calculated from the following:   Height as of this encounter: 6\' 2"  (1.88 m).   Weight as of this encounter: 95.6 kg.   DVT prophylaxis: SCDs no anticoagulation due to GI bleed Code Status: Full code Family Communication: Disposition Plan:  Status is: Inpatient  Remains inpatient appropriate because:Hemodynamically unstable   Dispo: The patient is from: Home              Anticipated d/c is to: Home              Anticipated d/c date is: 2 days              Patient currently is not medically stable to d/c.        Consultants:   EP  Cardiology  ID  Procedures:   Removal of AICD  SB enteroscopy  Multiple extraction of tooth #2, 28, 31 2 quadrants of alveoloplasty.  Gross debridement of remaining dentition.  Antimicrobials:  Ceftriaxone  Subjective: Patient seen after procedure today.  He is sedated after procedure.  He will open his eyes and fall back to sleep.  Objective: Vitals:   08/30/19 1215 08/30/19 1230 08/30/19 1300 08/30/19 1400  BP: 130/65 120/67 119/62 (!) 108/56  Pulse: 62 (!) 59 63 (!) 59  Resp: (!) 24 20 19 14   Temp:      TempSrc:      SpO2: 94% 95% 97% 98%  Weight:      Height:        Intake/Output Summary (Last 24 hours) at 08/30/2019 1459 Last data filed at 08/30/2019 1400 Gross per 24 hour  Intake 548.52 ml  Output 1375 ml  Net -826.48 ml   Filed Weights   08/26/19 0450 08/29/19 0500 08/30/19 0500  Weight: 97.3 kg 100.6 kg 95.6 kg    Examination:  General exam: Appears calm and comfortable  Respiratory system: Clear to auscultation. Respiratory effort normal. Cardiovascular system: S1 & S2 heard, RRR. No JVD, murmurs, rubs, gallops or clicks. No pedal edema. Gastrointestinal  system: Abdomen is nondistended, soft and nontender. No organomegaly or masses felt. Normal bowel sounds heard. Central nervous system: Sleepy Extremities: Mild edema   Data Reviewed: I have personally reviewed following labs and imaging studies  CBC: Recent Labs  Lab 08/26/19 0201 08/27/19 0505 08/28/19 0350 08/29/19 0337 08/30/19 0206  WBC 6.8 7.8 6.2 6.6 6.5  NEUTROABS 6.0 5.9 4.7 5.1 4.8  HGB 8.3* 8.1* 7.7* 7.7* 7.7*  HCT 28.5* 28.4* 26.6* 26.4* 26.0*  MCV 84.8 86.3 87.2 86.3 84.1  PLT 192 220 194 207 578   Basic Metabolic Panel: Recent Labs  Lab 08/24/19 2100 08/25/19 0947 08/26/19 0201 08/27/19 0505 08/28/19 0350 08/29/19 0337 08/30/19 0206  NA 141   < > 142 140 138 138 139  K 3.5   < > 4.0 4.3 3.8 3.8 3.4*  CL 108   < > 108 107 106 103 103  CO2 28   < > 25 26  27 27 28   GLUCOSE 116*   < > 124* 100* 111* 98 96  BUN 12   < > 12 20 20 17 16   CREATININE 1.06   < > 1.13 1.32* 1.30* 1.10 1.06  CALCIUM 7.5*   < > 7.2* 7.2* 7.6* 7.7* 7.7*  MG 2.1  --  1.9  --   --   --   --   PHOS 2.3*  --   --   --   --   --   --    < > = values in this interval not displayed.   GFR: Estimated Creatinine Clearance: 62.5 mL/min (by C-G formula based on SCr of 1.06 mg/dL). Liver Function Tests: Recent Labs  Lab 08/24/19 2100  ALBUMIN 1.6*   No results for input(s): LIPASE, AMYLASE in the last 168 hours. No results for input(s): AMMONIA in the last 168 hours. Coagulation Profile: Recent Labs  Lab 08/24/19 2100  INR 1.3*   Cardiac Enzymes: No results for input(s): CKTOTAL, CKMB, CKMBINDEX, TROPONINI in the last 168 hours. BNP (last 3 results) No results for input(s): PROBNP in the last 8760 hours. HbA1C: No results for input(s): HGBA1C in the last 72 hours. CBG: Recent Labs  Lab 08/25/19 0518 08/26/19 0455 08/30/19 0702  GLUCAP 93 114* 99   Lipid Profile: No results for input(s): CHOL, HDL, LDLCALC, TRIG, CHOLHDL, LDLDIRECT in the last 72 hours. Thyroid Function  Tests: No results for input(s): TSH, T4TOTAL, FREET4, T3FREE, THYROIDAB in the last 72 hours. Anemia Panel: No results for input(s): VITAMINB12, FOLATE, FERRITIN, TIBC, IRON, RETICCTPCT in the last 72 hours. Sepsis Labs: No results for input(s): PROCALCITON, LATICACIDVEN in the last 168 hours.  Recent Results (from the past 240 hour(s))  Surgical PCR screen     Status: None   Collection Time: 08/25/19 11:53 AM   Specimen: Nasal Mucosa; Nasal Swab  Result Value Ref Range Status   MRSA, PCR NEGATIVE NEGATIVE Final   Staphylococcus aureus NEGATIVE NEGATIVE Final    Comment: (NOTE) The Xpert SA Assay (FDA approved for NASAL specimens in patients 12 years of age and older), is one component of a comprehensive surveillance program. It is not intended to diagnose infection nor to guide or monitor treatment. Performed at [facility], [place] [address], [place], [place] [address]   Culture, blood (routine x 2)     Status: None (Preliminary result)   Collection Time: 08/26/19  6:26 AM   Specimen: BLOOD LEFT HAND  Result Value Ref Range Status   Specimen Description BLOOD LEFT HAND  Final   Special Requests   Final    BOTTLES DRAWN AEROBIC AND ANAEROBIC Blood Culture results may not be optimal due to an inadequate volume of blood received in culture bottles   Culture   Final    NO GROWTH 4 DAYS Performed at [facility], [place] [address], [place], [place] [address]    Report Status PENDING  Incomplete  Culture, blood (routine x 2)     Status: None (Preliminary result)   Collection Time: 08/26/19  6:26 AM   Specimen: BLOOD  Result Value Ref Range Status   Specimen Description BLOOD LEFT ARM  Final   Special Requests   Final    BOTTLES DRAWN AEROBIC AND ANAEROBIC Blood Culture adequate volume   Culture   Final    NO GROWTH 4 DAYS Performed at [facility], [place] [address], [place], [place] [address]    Report Status  PENDING  Incomplete         Radiology  Studies: CT 3D RECON AT SCANNER  Result Date: 08/29/2019 CLINICAL DATA:  Nonspecific (abnormal) findings on radiological and other examination of musculoskeletal system. EXAM: 3-DIMENSIONAL CT IMAGE RENDERING ON ACQUISITION WORKSTATION TECHNIQUE: 3-dimensional CT images were rendered by post-processing of the original CT data on an acquisition workstation. The 3-dimensional CT images were interpreted and findings were reported in the accompanying complete CT report for this study COMPARISON:  None. FINDINGS: Refer to  CT maxillofacial 08/28/2019 IMPRESSION: Refer to CT maxillofacial 08/28/2019 Electronically Signed   By: Franchot Gallo M.D.   On: 08/29/2019 09:16   CT MAXILLOFACIAL W CONTRAST  Result Date: 08/28/2019 CLINICAL DATA:  Endocarditis suspected.  Rule out dental infection. EXAM: CT MAXILLOFACIAL WITH CONTRAST TECHNIQUE: Multidetector CT imaging of the maxillofacial structures was performed with intravenous contrast. Multiplanar CT image reconstructions were also generated. CONTRAST:  20mL OMNIPAQUE IOHEXOL 300 MG/ML  SOLN COMPARISON:  None. FINDINGS: Osseous: No facial fracture.  No acute skeletal abnormality. Numerous caries are present bilaterally. Extensive destruction of the right upper third molar with periapical lucency. Orbits: No orbital mass or edema.  Bilateral cataract extraction. Sinuses: Soft tissue masslike lesion in the upper nasal cavity measuring approximately 22 x 14 mm. This is on both sides of the septum. No destruction of the septum. Soft tissues: No soft tissue mass or adenopathy in the area of interest. Limited intracranial: Generalized atrophy. No acute intracranial abnormality. IMPRESSION: 1. Numerous caries bilaterally. Right upper third molar caries with periapical lucency. 2. Soft tissue mass in the upper nasal cavity bilaterally. Recommend direct visualization and biopsy if appropriate. Electronically Signed   By: Franchot Gallo M.D.   On: 08/28/2019 20:09   DG CHEST  PORT 1 VIEW  Result Date: 08/29/2019 CLINICAL DATA:  85 year old male status post ICD extraction. EXAM: PORTABLE CHEST 1 VIEW COMPARISON:  Chest x-ray 08/28/2019. FINDINGS: Left costophrenic sulcus incompletely imaged. Lung volumes are slightly low. Opacities are noted throughout the mid to lower lungs bilaterally, which may reflect areas of atelectasis and/or consolidation. Small bilateral pleural effusions. Cephalization of the pulmonary vasculature. Mild cardiomegaly. Upper mediastinal contours are distorted by patient's rotation to the left. Aortic atherosclerosis. Status post median sternotomy. Left-sided pacemaker device in place with lead tip projecting over the expected location of the right ventricle. Additional lead extending into the right-side of the heart via the inferior vena cava. IMPRESSION: 1. Postoperative changes and support apparatus, as above. 2. Persistent areas of atelectasis and/or consolidation throughout the mid to lower lungs bilaterally with small bilateral pleural effusions. 3. Cardiomegaly. Electronically Signed   By: Vinnie Langton M.D.   On: 08/29/2019 10:42        Scheduled Meds: . sodium chloride   Intravenous Once  . aminocaproic acid  10 mL Oral Q1H  . Chlorhexidine Gluconate Cloth  6 each Topical Daily  . fentaNYL      . ferrous sulfate  325 mg Oral Daily  . furosemide  40 mg Intravenous BID  . Gerhardt's butt cream   Topical BID  . heparin injection (subcutaneous)  5,000 Units Subcutaneous Q8H  . multivitamin with minerals  1 tablet Oral Daily  . pantoprazole  40 mg Intravenous Q12H  . potassium chloride  40 mEq Oral BID  . simvastatin  20 mg Oral q1800  . sodium chloride flush  3 mL Intravenous Q12H   Continuous Infusions: . sodium chloride 10 mL/hr at 08/30/19 0400  . cefTRIAXone (ROCEPHIN)  IVPB 2 gram/100 mL NS (Mini-Bag Plus) Stopped (08/30/19 0008)  . lactated ringers 10 mL/hr at 08/30/19 1200     LOS: 6 days    Time spent: 35 minutes.      Elmarie Shiley, MD [facility]   If 7PM-7AM, please contact night-coverage [email or web address]  08/30/2019, 2:59 PM

## 2019-08-30 NOTE — Progress Notes (Addendum)
Progress Note  Patient Name: Thomas [name] Date of Encounter: 08/30/2019  [facility] Cardiologist: Sanda Klein, MD   Subjective   Today's visit is done with [employer] video interprtor ASL sign interpretor [name] # [id number]  He continues to feel OK, says he feels better today,  No CP, palpitations, he denies SOB.    Inpatient Medications    Scheduled Meds: . sodium chloride   Intravenous Once  . Chlorhexidine Gluconate Cloth  6 each Topical Daily  . ferrous sulfate  325 mg Oral Daily  . furosemide  40 mg Intravenous BID  . Gerhardt's butt cream   Topical BID  . heparin injection (subcutaneous)  5,000 Units Subcutaneous Q8H  . multivitamin with minerals  1 tablet Oral Daily  . pantoprazole  40 mg Intravenous Q12H  . potassium chloride  40 mEq Oral BID  . simvastatin  20 mg Oral q1800  . sodium chloride flush  3 mL Intravenous Q12H   Continuous Infusions: . sodium chloride 10 mL/hr at 08/30/19 0400  .  ceFAZolin (ANCEF) IV    . cefTRIAXone (ROCEPHIN) IVPB 2 gram/100 mL NS (Mini-Bag Plus) Stopped (08/30/19 0008)   PRN Meds: sodium chloride, acetaminophen, acetaminophen, albuterol, dextromethorphan-guaiFENesin, ondansetron (ZOFRAN) IV, sodium chloride flush   Vital Signs    Vitals:   08/30/19 0400 08/30/19 0500 08/30/19 0600 08/30/19 0700  BP: (!) 113/57 (!) 105/50 (!) 106/56   Pulse: (!) 59 60 (!) 59   Resp: (!) 21 14 19    Temp: 98.3 F (36.8 C)   98.2 F (36.8 C)  TempSrc: Oral   Oral  SpO2: 96% 98% 98%   Weight:  95.6 kg    Height:        Intake/Output Summary (Last 24 hours) at 08/30/2019 0709 Last data filed at 08/30/2019 0600 Gross per 24 hour  Intake 787.76 ml  Output 1475 ml  Net -687.24 ml   Last 3 Weights 08/30/2019 08/29/2019 08/26/2019  Weight (lbs) 210 lb 12.2 oz 221 lb 12.5 oz 214 lb 8.1 oz  Weight (kg) 95.6 kg 100.6 kg 97.3 kg      Telemetry    VP occ/infrequent PVCs, couplets, NSVT episodes - Personally Reviewed  ECG    No new EKGs -  Personally Reviewed  Physical Exam   Exam remains essentially unchanged GEN: No acute distress.   Neck: No JVD Cardiac: RRR, no murmurs, rubs, or gallops.  Respiratory: CTA b/l (ant/lat auscultation) b/l GI: Soft, nontender, non-distended  MS: marked edema to about mid shin; No deformity. Neuro:  Nonfocal (known deafness) Psych: Normal affect   R groin: dressing is clean and dry, temp wire in place  R IJ temp perm dressing is stable, clean, dry.   No bleeding  Labs    High Sensitivity Troponin:  No results for input(s): TROPONINIHS in the last 720 hours.    Chemistry Recent Labs  Lab 08/24/19 2100 08/25/19 0947 08/28/19 0350 08/29/19 0337 08/30/19 0206  NA 141   < > 138 138 139  K 3.5   < > 3.8 3.8 3.4*  CL 108   < > 106 103 103  CO2 28   < > 27 27 28   GLUCOSE 116*   < > 111* 98 96  BUN 12   < > 20 17 16   CREATININE 1.06   < > 1.30* 1.10 1.06  CALCIUM 7.5*   < > 7.6* 7.7* 7.7*  ALBUMIN 1.6*  --   --   --   --  GFRNONAA >60   < > 51* >60 >60  GFRAA >60   < > 59* >60 >60  ANIONGAP 5   < > 5 8 8    < > = values in this interval not displayed.     Hematology Recent Labs  Lab 08/28/19 0350 08/29/19 0337 08/30/19 0206  WBC 6.2 6.6 6.5  RBC 3.05* 3.06* 3.09*  HGB 7.7* 7.7* 7.7*  HCT 26.6* 26.4* 26.0*  MCV 87.2 86.3 84.1  MCH 25.2* 25.2* 24.9*  MCHC 28.9* 29.2* 29.6*  RDW 19.9* 20.0* 20.4*  PLT 194 207 198    BNP Recent Labs  Lab 08/24/19 2100  BNP 480.2*     DDimer No results for input(s): DDIMER in the last 168 hours.   Radiology    08/28/2019: CT, maxillofacial  IMPRESSION: 1. Numerous caries bilaterally. Right upper third molar caries with periapical lucency. 2. Soft tissue mass in the upper nasal cavity bilaterally. Recommend direct visualization and biopsy if appropriate   DG CHEST PORT 1 VIEW Result Date: 08/28/2019 CLINICAL DATA:  Chest pain EXAM: PORTABLE CHEST 1 VIEW COMPARISON:  August 26, 2019 FINDINGS: There is cardiomegaly. There is  mild pulmonary venous hypertension. Pacemaker leads attached to right atrium and coronary sinus,, stable. There are persistent bilateral pleural effusions with airspace opacity in the left lower lobe, stable. No new opacity evident. There is no evident adenopathy. There is aortic atherosclerosis. No bone lesions. IMPRESSION: Cardiomegaly with a degree of pulmonary vascular congestion. Persistent pleural effusions bilaterally with left lower lobe airspace opacity concerning for atelectasis and potential pneumonia in the left lower lung region. No new opacity evident. Pacemaker leads unchanged in position. Status post coronary artery bypass grafting. Aortic Atherosclerosis (ICD10-I70.0). Electronically Signed   By: Lowella Grip III M.D.   On: 08/28/2019 08:11    VAS [place] UPPER EXTREMITY VENOUS DUPLEX Result Date: 08/26/2019 UPPER VENOUS STUDY  Indications: Swelling Performing Technologist: June Leap RDMS, RVT  Examination Guidelines: A complete evaluation includes B-mode imaging, spectral Doppler, color Doppler, and power Doppler as needed of all accessible portions of each vessel. Bilateral testing is considered an integral part of a complete examination. Limited examinations for reoccurring indications may be performed as noted.  Summary:  Left: No evidence of deep vein thrombosis in the upper extremity. Findings consistent with acute superficial vein thrombosis involving the left cephalic vein.  *See table(s) above for measurements and observations.  Diagnosing physician: Monica Martinez MD Electronically signed by Monica Martinez MD on 08/26/2019 at 12:49:19 PM.    Final       Cardiac Studies   08/23/2019: TEE IMPRESSIONS  1. Left ventricular ejection fraction, by estimation, is 55 to 60%. The  left ventricle has normal function. The left ventricle has no regional  wall motion abnormalities.   2. Right ventricular systolic function is normal. The right ventricular  size is normal.   3.  Left atrial size was mild to moderately dilated. No left atrial/left  atrial appendage thrombus was detected.   4. Right atrial size was mildly dilated.   5. The mitral valve is normal in structure. Mild mitral valve  regurgitation.   6. Tricuspid valve regurgitation is moderate to severe.   7. The aortic valve is tricuspid. Aortic valve regurgitation is mild.   Conclusion(s)/Recommendation(s): No evidence of vegetation/infective  endocarditis on this transesophageal  echocardiogram.   Patient Profile     85 y.o. male w/PMHx of congenital deafness, CAD s/p 1986 CABG as below, 2000 inferior STEMI with subsequent  VT/ 2nd degree AVB /CHB, dual chamber PPM implantation 04/25/1998 due to CHB with change-out 2007, 2014 Medtronic CRT-D due to EF 30% despite GDMT, multiple catheterizations / PCIs, PCI/BMS placed SVG-OM 2015, known CTO of the SVG-RCA, HFrEF with recovery of EF by most recent  (55-60%, 08/2019), lymphedema, AAA and aortic root dilation, remote strokes as seen on CT imaging, permanent Afib not on [place] due to GIB, s/p transfusion / known AVMs / falls, anemia, CKDIII, prostate CA, HTN, HLD.  He has h/o recurrent group B streptococcus with recent 06/2019 admission due to sepsis with blood cultures positive for strep B sepsis. He was initially placed on broad-spectrum antibiotics, which were then consolidated to penicillin. TEE 06/2019 was performed and without evidence of vegetation on pacer wires or cardiac valves. He was IV diuresed and sent home on prolonged abx for 6-8 weeks.  Of note, ultrasound was performed of his left upper extremity due to LUE>RUE and without evidence of DVT.   He was readmitted to [facility] after near syncope 08/18/2019 and found to have recurrent group B streptococcus bacteremia. He noted a strange feeling of "coldness" in his chest, dizziness, and intermittent SOB with his near fall. During admission, he reported melena. Subsequent 08/22/2019 enteroscopy was without evidence of  active GIB.  Recovery of EF noted in 08/2019 echo. TEE  08/23/2019 was without evidence of vegetation on device or cardiac valves.  Infectious disease recommendation was for extraction of device, given recurrent bacteremia despite prolonged course of antibiotics.  Case was thus discussed with Dr. [name] of [facility] given multiple comorbid conditions and likely high risk extraction. EP reviewed the case with recommendation to transfer to [facility] secondary to class I indication for extraction of device  Assessment & Plan    Sepsis recurrent Group B streptococcus bacteremia Unknown source, of note, very poor dentition  Remains Afebrile with no leukocytosis BC X 2 done on 08/20/19 were negative repeat BC (x2) done on 08/26/19 after pacemaker removal negative x3 days (one bottle with inadequate volume) TEE was performed on 6/9 with no evidence of vegetations   ID signed off 08/27/19 noting: Continue with ceftriaxone through 09/22/19.  PICC line order placed for tomorrow for left arm placement (done) OPAT orders in IV HH orders under consult He will follow up with ID, Dr. [name] at [facility] Dr. [name].  , planned for OR today with extraction of tooth #2 with alveoloplasty and gross debridement of remaining dentition, with possible further extractions if needed once in the OR    CHB CRT-D system extraction 08/25/2019 Has temp-perm L IJ (dressing changed today) He also has a temp wire via R groin Will need to discuss timing of new implant likely plans for new device Friday  Planned to remove temp wire out of R groin this AM, though given plans for OR ands suspect witll have some head/neck movement, will leave back up pacing temp wire in and recheck temp perm threshold post op If thresholds are stable will try to get temp wire pulled this afternoon      Left base PNA Currently on 2L n/c, afebrile IM is managing,  ? If more CHF then anything gettinge IV Ceftriaxone  [place] Supplemental O2 as needed, duoneb prn, inhalers   Acute on chronic diastolic HF CXR as above Recent TEE with EF of 55-60% He is very edematous, he reports this extent of edema a month or two now Plan to restart home torsemide, cozaar, aldactone once BP permits  gettiing IV lasix, Creat stable Cumulatively fluid neg (-1391), yesterday neg -472ml though weight is going up steadily Continue BID IV lasix today K+ supplementation already addressed by IM    LUE edema USS negative for DVT in 06/2019, repeat here showed no DVT, did find acute superficial vein thrombosis Conservative management per IM Unable to receive AC due to GIB   Acute on chronic blood loss anemia with iron def Baseline hemoglobin between 7-8 S/P SB capsule endoscopy without any source of bleeding S/P IV iron, 2U PRBC (earlier in his stay) Defer to IM management, and transfusion if need DVT prophylaxis resumed yesterday H/H stable    Permanent Afib Rate controlled, paced rhythm Currently not on AC due to GIB   CAD/ CABG, DES/ HLD Remains Chest pain free Continue statin   HTN Relative hypotension here BP stable   CKD stage 3a Some mild AKI  Creat stable   CT noted ? Nasal passage mass Will defer to medicine team thought and recommendations likely outpatient w/u via his PMD   For questions or updates, please contact [place] Please consult [email or web address] for contact info under     Signed, Baldwin Jamaica, PA-C  08/30/2019, 7:09 AM    EP Attending  Patient seen and examined. Agree with the findings as noted above. The patient has stabilized and has undergone ICD system extraction with insertion of a temp/perm/PM. He will undergo dental eval/extraction prior to insertion of a new device.  Mikle Bosworth.D.

## 2019-08-30 NOTE — Transfer of Care (Signed)
Immediate Anesthesia Transfer of Care Note  Patient: Thomas [name]  Procedure(s) Performed: Extractionof tooth #'s 2, 73, and 31 with alveoloplasty and gross debridement of remaining teeth. (Bilateral Mouth)  Patient Location: PACU  Anesthesia Type:General  Level of Consciousness: drowsy and responds to stimulation  Airway & Oxygen Therapy: Patient Spontanous Breathing and Patient connected to face mask oxygen  Post-op Assessment: Report given to RN and Post -op Vital signs reviewed and stable  Post vital signs: Reviewed and stable  Last Vitals:  Vitals Value Taken Time  BP 120/62 08/30/19 1003  Temp    Pulse 59 08/30/19 1004  Resp 13 08/30/19 1004  SpO2 97 % 08/30/19 1004  Vitals shown include unvalidated device data.  Last Pain:  Vitals:   08/30/19 0700  TempSrc: Oral  PainSc:       Patients Stated Pain Goal: 2 (81/27/51 7001)  Complications: No complications documented.

## 2019-08-30 NOTE — Progress Notes (Signed)
PRE-OPERATIVE NOTE:  08/30/2019 [name] [id number]  VITALS: BP (!) 113/59   Pulse 60   Temp 98.2 F (36.8 C) (Oral)   Resp 18   Ht 6\' 2"  (1.88 m)   Wt 95.6 kg   SpO2 97%   BMI 27.06 kg/m   Lab Results  Component Value Date   WBC 6.5 08/30/2019   HGB 7.7 (L) 08/30/2019   HCT 26.0 (L) 08/30/2019   MCV 84.1 08/30/2019   PLT 198 08/30/2019   BMET    Component Value Date/Time   NA 139 08/30/2019 0206   NA 146 (H) 03/28/2018 1625   NA 140 02/14/2014 0546   K 3.4 (L) 08/30/2019 0206   K 4.1 02/14/2014 0546   CL 103 08/30/2019 0206   CL 107 02/14/2014 0546   CO2 28 08/30/2019 0206   CO2 25 02/14/2014 0546   GLUCOSE 96 08/30/2019 0206   GLUCOSE 103 (H) 02/14/2014 0546   BUN 16 08/30/2019 0206   BUN 33 (H) 03/28/2018 1625   BUN 30 (H) 02/14/2014 0546   CREATININE 1.06 08/30/2019 0206   CREATININE 1.24 02/14/2014 0546   CALCIUM 7.7 (L) 08/30/2019 0206   CALCIUM 9.6 02/14/2014 0546   GFRNONAA >60 08/30/2019 0206   GFRNONAA >60 02/14/2014 0546   GFRNONAA 41 (L) 09/14/2012 2047   GFRAA >60 08/30/2019 0206   GFRAA >60 02/14/2014 0546   GFRAA 47 (L) 09/14/2012 2047    Lab Results  Component Value Date   INR 1.3 (H) 08/24/2019   INR 1.6 (H) 08/18/2019   INR 1.4 (H) 06/16/2019   No results found for: PTT   Thomas [name] presents for extraction of indicated teeth with alveoloplasty and gross debridement remaining dentition in the operating room with general anesthesia.     SUBJECTIVE: The patient denies any acute medical or dental changes and agrees to proceed with treatment as planned.  Patient understands that tooth #2 will be extracted and other teeth will be evaluated for extraction upon further examination in the operating room under general anesthesia.  EXAM: No sign of acute dental changes.  ASSESSMENT: Patient is affected by chronic apical periodontitis, dental caries, retained root segments, chronic periodontitis, and accretions.  PLAN: Patient  agrees to proceed with treatment as planned in the operating room as previously discussed and accepts the risks, benefits, and complications of the proposed treatment. Patient is aware of the risk for bleeding, bruising, swelling, infection, pain, nerve damage, soft tissue damage, damage to adjacent teeth, sinus involvement, root tip fracture, mandible fracture, and the risks of complications associated with the anesthesia. Patient also is aware of the potential for other complications up to and including death due to his overall cardiovascular compromise.    [name], DDS

## 2019-08-30 NOTE — Telephone Encounter (Signed)
Patients wife is calling because the patient is in ICU and would like the son to see his father. Messaged Dr. [name] nurse and she said that Dr. [name] said it was fine and they will let the EP APP over there know.

## 2019-08-30 NOTE — Anesthesia Postprocedure Evaluation (Signed)
Anesthesia Post Note  Patient: Thomas [name]  Procedure(s) Performed: Extractionof tooth #'s 2, 29, and 31 with alveoloplasty and gross debridement of remaining teeth. (Bilateral Mouth)     Patient location during evaluation: PACU Anesthesia Type: General Level of consciousness: awake and alert Pain management: pain level controlled Vital Signs Assessment: post-procedure vital signs reviewed and stable Respiratory status: spontaneous breathing, nonlabored ventilation, respiratory function stable and patient connected to nasal cannula oxygen Cardiovascular status: blood pressure returned to baseline and stable Postop Assessment: no apparent nausea or vomiting Anesthetic complications: no   No complications documented.  Last Vitals:  Vitals:   08/30/19 1500 08/30/19 1535  BP: (!) 98/45   Pulse: (!) 59   Resp: 15   Temp:  36.6 C  SpO2: 96%     Last Pain:  Vitals:   08/30/19 1200  TempSrc:   PainSc: [place] DAVID

## 2019-08-30 NOTE — Telephone Encounter (Signed)
Per Dr. [name]- ok for Pt's son to visit patient.  Notified wife.  Also notified EP APP at hospital to notify ICU nurse.

## 2019-08-30 NOTE — Progress Notes (Signed)
PT Cancellation Note  Patient Details Name: Thomas [name] MRN: [id number] DOB: February 28, 1935   Cancelled Treatment:    Reason Eval/Treat Not Completed: Patient at procedure or test/unavailable - will check back as schedule allows.  [place] Pager [phone]  Office [phone]    Roxine Caddy D Elonda Husky 08/30/2019, 8:11 AM

## 2019-08-31 ENCOUNTER — Encounter (HOSPITAL_COMMUNITY): Payer: Self-pay | Admitting: Dentistry

## 2019-08-31 ENCOUNTER — Other Ambulatory Visit: Payer: Self-pay

## 2019-08-31 LAB — CULTURE, BLOOD (ROUTINE X 2)
Culture: NO GROWTH
Culture: NO GROWTH
Special Requests: ADEQUATE

## 2019-08-31 LAB — CBC WITH DIFFERENTIAL/PLATELET
Abs Immature Granulocytes: 0.02 10*3/uL (ref 0.00–0.07)
Basophils Absolute: 0 10*3/uL (ref 0.0–0.1)
Basophils Relative: 0 %
Eosinophils Absolute: 0 10*3/uL (ref 0.0–0.5)
Eosinophils Relative: 0 %
HCT: 27.4 % — ABNORMAL LOW (ref 39.0–52.0)
Hemoglobin: 8 g/dL — ABNORMAL LOW (ref 13.0–17.0)
Immature Granulocytes: 0 %
Lymphocytes Relative: 11 %
Lymphs Abs: 0.7 10*3/uL (ref 0.7–4.0)
MCH: 25.2 pg — ABNORMAL LOW (ref 26.0–34.0)
MCHC: 29.2 g/dL — ABNORMAL LOW (ref 30.0–36.0)
MCV: 86.2 fL (ref 80.0–100.0)
Monocytes Absolute: 0.5 10*3/uL (ref 0.1–1.0)
Monocytes Relative: 7 %
Neutro Abs: 5.5 10*3/uL (ref 1.7–7.7)
Neutrophils Relative %: 82 %
Platelets: 245 10*3/uL (ref 150–400)
RBC: 3.18 MIL/uL — ABNORMAL LOW (ref 4.22–5.81)
RDW: 20.8 % — ABNORMAL HIGH (ref 11.5–15.5)
WBC: 6.7 10*3/uL (ref 4.0–10.5)
nRBC: 0 % (ref 0.0–0.2)

## 2019-08-31 LAB — BASIC METABOLIC PANEL
Anion gap: 8 (ref 5–15)
BUN: 17 mg/dL (ref 8–23)
CO2: 27 mmol/L (ref 22–32)
Calcium: 7.7 mg/dL — ABNORMAL LOW (ref 8.9–10.3)
Chloride: 104 mmol/L (ref 98–111)
Creatinine, Ser: 1.08 mg/dL (ref 0.61–1.24)
GFR calc Af Amer: >60 mL/min (ref >60)
GFR calc non Af Amer: >60 mL/min (ref >60)
Glucose, Bld: 104 mg/dL — ABNORMAL HIGH (ref 70–99)
Potassium: 4.6 mmol/L (ref 3.5–5.1)
Sodium: 139 mmol/L (ref 135–145)

## 2019-08-31 LAB — SURGICAL PCR SCREEN
MRSA, PCR: NEGATIVE
Staphylococcus aureus: NEGATIVE

## 2019-08-31 LAB — GLUCOSE, CAPILLARY: Glucose-Capillary: 106 mg/dL — ABNORMAL HIGH (ref 70–99)

## 2019-08-31 MED ORDER — SODIUM CHLORIDE 0.9 % IV SOLN: INTRAVENOUS | Status: DC

## 2019-08-31 MED ORDER — SENNA 8.6 MG PO TABS
1.0000 | ORAL_TABLET | Freq: Every day | ORAL | Status: DC
  Administered 2019-08-31 – 2019-09-12 (×10): 8.6 mg via ORAL
  Filled 2019-08-31 (×10): qty 1

## 2019-08-31 MED ORDER — SODIUM CHLORIDE 0.9 % IV SOLN
80.0000 mg | INTRAVENOUS | Status: AC
  Administered 2019-09-01: 80 mg
  Filled 2019-08-31: qty 2

## 2019-08-31 MED ORDER — POLYETHYLENE GLYCOL 3350 17 G PO PACK
17.0000 g | PACK | Freq: Every day | ORAL | Status: DC
  Administered 2019-08-31 – 2019-09-12 (×6): 17 g via ORAL
  Filled 2019-08-31 (×7): qty 1

## 2019-08-31 NOTE — Progress Notes (Signed)
PT Cancellation Note  Patient Details Name: Thomas [name] MRN: [id number] DOB: [birth year]   Cancelled Treatment:    Reason Eval/Treat Not Completed: Patient not medically ready (pt remains with femoral sheath)   Cissy Galbreath B Thomas [name] 08/31/2019, 7:47 AM Bayard Males, PT Acute Rehabilitation Services Pager: [phone] Office: [phone]

## 2019-08-31 NOTE — Progress Notes (Signed)
PROGRESS NOTE    Thomas [name]  ERD:[id number] DOB: [birth year] DOA: 08/24/2019 PCP: [name], MD   Brief Narrative: 85 year old, deaf male with past medical history significant for CAD, high-grade AV block status post Medtronic dual-chamber biventricular ICD, chronic systolic heart failure ejection fraction as low as 30%, recent ejection fraction 55% on TEE on 08/23/2019, stage III CKD, chronic A. 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 At [facility] patient was followed by both Dr. Tama High with ID and Dr. [name] with cardiology.  TEE was performed on 6/9 with no evidence of vegetation.  Despite this finding, infectious disease recommended transfer to [facility] for removal of ICD considering recurrent group B strep bacteremia.  During patient hospital course, patient also was found to be anemic status post 2 unit of packed red blood cells.  Dr. [name] with GI was consulted who performed a small bowel enteroscopy revealing no evidence of significant pathology in the duodenum, jejunum, esophagus or stomach.  On 08/25/2019 patient underwent extraction of the biventricular ICD system along with [place] right ventricular pacing lead and removal of the biventricular ICD with insertion of a new temp perm transvenous pacemaker.  Dr. Dorothyann Gibbs dentist was consulted for dental caries extraction.  Patient underwent procedure multiple extraction of tooth #2, 28, 31.  2 quadrants of alveoloplasty.  On 6/16.     Assessment & Plan:   Principal Problem:   Bacteremia due to group B Streptococcus Active Problems:   Cardiomyopathy, ischemic   Permanent atrial fibrillation (HCC)    Benign essential HTN   Mixed hyperlipidemia   Iron deficiency anemia due to chronic blood loss   Coronary artery disease of bypass graft of native heart with stable angina pectoris (HCC)   Acute renal failure superimposed on stage 3a chronic kidney disease (HCC)   Sepsis due to group B Streptococcus (HCC)   Duodenal arteriovenous malformation   Candidal intertrigo   Edema of left upper extremity   Pneumonia of left lower lobe due to group B Streptococcus (HCC)  1-Septic shock likely secondary to group B streptococcus bacteremia Unknown source, but patient with very poor dentition. EEG on 6/9 no evidence of vegetation Blood cultures on 08/26/2019 left after pacemaker removal no growth to date. Patient underwent extraction of the pacemaker on 6/11, he currently has an temporary pacer. On IV Rocephin.  He needs IV antibiotics through 70/90/2021 Plan to have insertion of Pacemaker/defibrilator tomorrow.   2-Left base pneumonia: Currently on room air. Continue with IV ceftriaxone Nebulizer  3-Acute on chronic systolic heart failure: Recent TEE show ejection fraction 55% EP/cardiology on board on IV Lasix as needed Plan is to start torsemide, Cozaar and Aldactone once blood pressure stabilized  Left upper extremity edema: Ultrasound negative for DVT 06/2019.  Repeat showed acute superficial vein thrombosis  Acute on chronic blood loss anemia with iron deficiency: Status post capsule endoscopy without any source of bleeding Status post IV iron, 2 unit of packed red blood cell Hb stable   Permanent A. fib: Rate control No anticoagulation due to GI bleed  CAD/CABG, DES Continue with statins  Hypertension Holding Cozaar due to blood pressure being soft  CKD stage IIIa: Monitor Hypokalemia: Replete orally  Soft mass upper nasal cavity B/L ; needs follow up with ENT>   Pressure Injury 06/21/19 Buttocks Mid Stage 2 -  Partial thickness loss of dermis presenting as a shallow open  injury with a red, pink wound bed without slough. (Active)  06/21/19 0700  Location: Buttocks  Location Orientation: Mid  Staging: Stage 2 -  Partial thickness loss of dermis presenting as a shallow open injury with a red, pink wound bed without slough.  Wound Description (Comments):   Present on Admission:                    Estimated body mass index is 27.63 kg/m as calculated from the following:   Height as of this encounter: 6\' 2"  (1.88 m).   Weight as of this encounter: 97.6 kg.   DVT prophylaxis: SCDs no anticoagulation due to GI bleed Code Status: Full code Family Communication: Disposition Plan:  Status is: Inpatient  Remains inpatient appropriate because:Hemodynamically unstable   Dispo: The patient is from: Home              Anticipated d/c is to: Home              Anticipated d/c date is: 2 days              Patient currently is not medically stable to d/c.        Consultants:   EP  Cardiology  ID  Procedures:   Removal of AICD  SB enteroscopy  Multiple extraction of tooth #2, 28, 31 2 quadrants of alveoloplasty.  Gross debridement of remaining dentition.  Antimicrobials:  Ceftriaxone  Subjective: Use translator.  Patient report feeling well, complaints of constipation. Denies abdominal pain.   Objective: Vitals:   08/31/19 1133 08/31/19 1200 08/31/19 1300 08/31/19 1400  BP:  113/70 104/61 107/61  Pulse:  (!) 59 60 60  Resp:  18 (!) 23 13  Temp: (!) 97.3 F (36.3 C)     TempSrc:      SpO2:  97% 93% 99%  Weight:      Height:        Intake/Output Summary (Last 24 hours) at 08/31/2019 1443 Last data filed at 08/31/2019 0800 Gross per 24 hour  Intake 1119.45 ml  Output 625 ml  Net 494.45 ml   Filed Weights   08/29/19 0500 08/30/19 0500 08/31/19 0500  Weight: 100.6 kg 95.6 kg 97.6 kg    Examination:  General exam: NAD Respiratory system: CTA Cardiovascular system: S 1, S 2 RRR Gastrointestinal system: BS present,  soft, nt Central nervous system: alert Extremities: Mild edema   Data Reviewed: I have personally reviewed following labs and imaging studies  CBC: Recent Labs  Lab 08/27/19 0505 08/28/19 0350 08/29/19 0337 08/30/19 0206 08/31/19 0225  WBC 7.8 6.2 6.6 6.5 6.7  NEUTROABS 5.9 4.7 5.1 4.8 5.5  HGB 8.1* 7.7* 7.7* 7.7* 8.0*  HCT 28.4* 26.6* 26.4* 26.0* 27.4*  MCV 86.3 87.2 86.3 84.1 86.2  PLT 220 194 207 198 017   Basic Metabolic Panel: Recent Labs  Lab 08/24/19 2100 08/25/19 0947 08/26/19 0201 08/26/19 0201 08/27/19 0505 08/28/19 0350 08/29/19 0337 08/30/19 0206 08/31/19 0225  NA 141   < > 142   < > 140 138 138 139 139  K 3.5   < > 4.0   < > 4.3 3.8 3.8 3.4* 4.6  CL 108   < > 108   < > 107 106 103 103 104  CO2  28   < > 25   < > 26 27 27 28 27   GLUCOSE 116*   < > 124*   < > 100* 111* 98 96 104*  BUN 12   < > 12   < > 20 20 17 16 17   CREATININE 1.06   < > 1.13   < > 1.32* 1.30* 1.10 1.06 1.08  CALCIUM 7.5*   < > 7.2*   < > 7.2* 7.6* 7.7* 7.7* 7.7*  MG 2.1  --  1.9  --   --   --   --   --   --   PHOS 2.3*  --   --   --   --   --   --   --   --    < > = values in this interval not displayed.   GFR: Estimated Creatinine Clearance: 61.3 mL/min (by C-G formula based on SCr of 1.08 mg/dL). Liver Function Tests: Recent Labs  Lab 08/24/19 2100  ALBUMIN 1.6*   No results for input(s): LIPASE, AMYLASE in the last 168 hours. No results for input(s): AMMONIA in the last 168 hours. Coagulation Profile: Recent Labs  Lab 08/24/19 2100  INR 1.3*   Cardiac Enzymes: No results for input(s): CKTOTAL, CKMB, CKMBINDEX, TROPONINI in the last 168 hours. BNP (last 3 results) No results for input(s): PROBNP in the last 8760 hours. HbA1C: No results for input(s): HGBA1C in the last 72 hours. CBG: Recent Labs  Lab 08/25/19 0518 08/26/19 0455 08/30/19 0702 08/31/19 0513  GLUCAP 93 114* 99 106*   Lipid Profile: No results for input(s): CHOL, HDL, LDLCALC, TRIG, CHOLHDL,  LDLDIRECT in the last 72 hours. Thyroid Function Tests: No results for input(s): TSH, T4TOTAL, FREET4, T3FREE, THYROIDAB in the last 72 hours. Anemia Panel: No results for input(s): VITAMINB12, FOLATE, FERRITIN, TIBC, IRON, RETICCTPCT in the last 72 hours. Sepsis Labs: No results for input(s): PROCALCITON, LATICACIDVEN in the last 168 hours.  Recent Results (from the past 240 hour(s))  Surgical PCR screen     Status: None   Collection Time: 08/25/19 11:53 AM   Specimen: Nasal Mucosa; Nasal Swab  Result Value Ref Range Status   MRSA, PCR NEGATIVE NEGATIVE Final   Staphylococcus aureus NEGATIVE NEGATIVE Final    Comment: (NOTE) The Xpert SA Assay (FDA approved for NASAL specimens in patients 9 years of age and older), is one component of a comprehensive surveillance program. It is not intended to diagnose infection nor to guide or monitor treatment. Performed at [facility], [place] [address], [place], [place] [address]   Culture, blood (routine x 2)     Status: None   Collection Time: 08/26/19  6:26 AM   Specimen: BLOOD LEFT HAND  Result Value Ref Range Status   Specimen Description BLOOD LEFT HAND  Final   Special Requests   Final    BOTTLES DRAWN AEROBIC AND ANAEROBIC Blood Culture results may not be optimal due to an inadequate volume of blood received in culture bottles   Culture   Final    NO GROWTH 5 DAYS Performed at [facility], [place] [address], [place], [place] [address]    Report Status 08/31/2019 FINAL  Final  Culture, blood (routine x 2)     Status: None   Collection Time: 08/26/19  6:26 AM   Specimen: BLOOD  Result Value Ref Range Status   Specimen Description BLOOD LEFT ARM  Final   Special Requests  Final    BOTTLES DRAWN AEROBIC AND ANAEROBIC Blood Culture adequate volume   Culture   Final    NO GROWTH 5 DAYS Performed at [facility], [place] [address], [place], [place] [address]    Report Status 08/31/2019 FINAL  Final          Radiology Studies: No results found.      Scheduled Meds: . sodium chloride   Intravenous Once  . Chlorhexidine Gluconate Cloth  6 each Topical Daily  . ferrous sulfate  325 mg Oral Daily  . furosemide  40 mg Intravenous BID  . Gerhardt's butt cream   Topical BID  . heparin injection (subcutaneous)  5,000 Units Subcutaneous Q8H  . multivitamin with minerals  1 tablet Oral Daily  . pantoprazole  40 mg Intravenous Q12H  . polyethylene glycol  17 g Oral Daily  . senna  1 tablet Oral Daily  . simvastatin  20 mg Oral q1800  . sodium chloride flush  3 mL Intravenous Q12H   Continuous Infusions: . sodium chloride 10 mL/hr at 08/30/19 0400  . cefTRIAXone (ROCEPHIN) IVPB 2 gram/100 mL NS (Mini-Bag Plus) 2 g (08/31/19 0031)  . lactated ringers 10 mL/hr at 08/31/19 0400     LOS: 7 days    Time spent: 35 minutes.     Elmarie Shiley, MD [facility]   If 7PM-7AM, please contact night-coverage [email or web address]  08/31/2019, 2:43 PM

## 2019-08-31 NOTE — Progress Notes (Signed)
OT Cancellation Note  Patient Details Name: TRYPP HECKMANN MRN: [id number] DOB: September 11, 1934   Cancelled Treatment:    Reason Eval/Treat Not Completed: Patient not medically ready (pt remains with femoral sheath. Will hold until this is removed to proceed with OT POC)  Zenovia Jarred, MSOT, OTR/L [facility] Office Number: [phone] Pager: [phone]  Zenovia Jarred 08/31/2019, 7:54 AM

## 2019-08-31 NOTE — Progress Notes (Addendum)
Progress Note  Patient Name: Thomas [name] Date of Encounter: 08/31/2019  [facility] Cardiologist: Sanda Klein, MD  EP: Dr. [name]  Subjective   Today's visit is done with in person interpretor.   He feels OK this am. Mouth slightly sore, feels like a wire is in place. Dental to follow up this am. Otherwise without complaint.  Had several questions about timing of pacemaker removal/insertion that were answered.   Inpatient Medications    Scheduled Meds: . sodium chloride   Intravenous Once  . Chlorhexidine Gluconate Cloth  6 each Topical Daily  . ferrous sulfate  325 mg Oral Daily  . furosemide  40 mg Intravenous BID  . Gerhardt's butt cream   Topical BID  . heparin injection (subcutaneous)  5,000 Units Subcutaneous Q8H  . multivitamin with minerals  1 tablet Oral Daily  . pantoprazole  40 mg Intravenous Q12H  . polyethylene glycol  17 g Oral Daily  . senna  1 tablet Oral Daily  . simvastatin  20 mg Oral q1800  . sodium chloride flush  3 mL Intravenous Q12H   Continuous Infusions: . sodium chloride 10 mL/hr at 08/30/19 0400  . cefTRIAXone (ROCEPHIN) IVPB 2 gram/100 mL NS (Mini-Bag Plus) 2 g (08/31/19 0031)  . lactated ringers 10 mL/hr at 08/31/19 0400   PRN Meds: sodium chloride, acetaminophen, acetaminophen, albuterol, dextromethorphan-guaiFENesin, HYDROcodone-acetaminophen, ondansetron (ZOFRAN) IV, sodium chloride flush   Vital Signs    Vitals:   08/31/19 0900 08/31/19 1000 08/31/19 1100 08/31/19 1133  BP: 113/65 107/63 114/63   Pulse: (!) 59 (!) 59 60   Resp: 20 15 19    Temp:    (!) 97.3 F (36.3 C)  TempSrc:      SpO2: 97% 98% 97%   Weight:      Height:        Intake/Output Summary (Last 24 hours) at 08/31/2019 1137 Last data filed at 08/31/2019 0800 Gross per 24 hour  Intake 1123.71 ml  Output 825 ml  Net 298.71 ml   Last 3 Weights 08/31/2019 08/30/2019 08/29/2019  Weight (lbs) 215 lb 2.7 oz 210 lb 12.2 oz 221 lb 12.5 oz  Weight (kg) 97.6 kg  95.6 kg 100.6 kg      Telemetry    VP occasional PVCs including couplets. Occasional NSVT. Personally reviewed.   ECG    No new EKGs - Personally Reviewed  Physical Exam   Exam remains essentially unchanged.  GEN: NAD Neck: No JVD Cardiac: RRR, no murmurs, rubs, or gallops. Left temp perm pacer site bandage C/D/I. Respiratory: CTA GI: Soft, nontender, non-distended  MS: Marked edema to mid calf Neuro:  Nonfocal (known deafness) Psych: Normal/pleasant affect   R groin: Dressing C/D/I. Temp wire in place  R IJ temp perm: Dressing C/D/I.    Labs    High Sensitivity Troponin:  No results for input(s): TROPONINIHS in the last 720 hours.    Chemistry Recent Labs  Lab 08/24/19 2100 08/25/19 0947 08/29/19 0337 08/30/19 0206 08/31/19 0225  NA 141   < > 138 139 139  K 3.5   < > 3.8 3.4* 4.6  CL 108   < > 103 103 104  CO2 28   < > 27 28 27   GLUCOSE 116*   < > 98 96 104*  BUN 12   < > 17 16 17   CREATININE 1.06   < > 1.10 1.06 1.08  CALCIUM 7.5*   < > 7.7* 7.7* 7.7*  ALBUMIN 1.6*  --   --   --   --  GFRNONAA >60   < > >60 >60 >60  GFRAA >60   < > >60 >60 >60  ANIONGAP 5   < > 8 8 8    < > = values in this interval not displayed.     Hematology Recent Labs  Lab 08/29/19 0337 08/30/19 0206 08/31/19 0225  WBC 6.6 6.5 6.7  RBC 3.06* 3.09* 3.18*  HGB 7.7* 7.7* 8.0*  HCT 26.4* 26.0* 27.4*  MCV 86.3 84.1 86.2  MCH 25.2* 24.9* 25.2*  MCHC 29.2* 29.6* 29.2*  RDW 20.0* 20.4* 20.8*  PLT 207 198 245    BNP Recent Labs  Lab 08/24/19 2100  BNP 480.2*     DDimer No results for input(s): DDIMER in the last 168 hours.   Radiology    08/28/2019: CT, maxillofacial  IMPRESSION: 1. Numerous caries bilaterally. Right upper third molar caries with periapical lucency. 2. Soft tissue mass in the upper nasal cavity bilaterally. Recommend direct visualization and biopsy if appropriate   DG CHEST PORT 1 VIEW Result Date: 08/28/2019 CLINICAL DATA:  Chest pain EXAM:  PORTABLE CHEST 1 VIEW COMPARISON:  August 26, 2019 FINDINGS: There is cardiomegaly. There is mild pulmonary venous hypertension. Pacemaker leads attached to right atrium and coronary sinus,, stable. There are persistent bilateral pleural effusions with airspace opacity in the left lower lobe, stable. No new opacity evident. There is no evident adenopathy. There is aortic atherosclerosis. No bone lesions. IMPRESSION: Cardiomegaly with a degree of pulmonary vascular congestion. Persistent pleural effusions bilaterally with left lower lobe airspace opacity concerning for atelectasis and potential pneumonia in the left lower lung region. No new opacity evident. Pacemaker leads unchanged in position. Status post coronary artery bypass grafting. Aortic Atherosclerosis (ICD10-I70.0). Electronically Signed   By: Lowella Grip III M.D.   On: 08/28/2019 08:11    VAS [place] UPPER EXTREMITY VENOUS DUPLEX Result Date: 08/26/2019 UPPER VENOUS STUDY  Indications: Swelling Performing Technologist: June Leap RDMS, RVT  Examination Guidelines: A complete evaluation includes B-mode imaging, spectral Doppler, color Doppler, and power Doppler as needed of all accessible portions of each vessel. Bilateral testing is considered an integral part of a complete examination. Limited examinations for reoccurring indications may be performed as noted.  Summary:  Left: No evidence of deep vein thrombosis in the upper extremity. Findings consistent with acute superficial vein thrombosis involving the left cephalic vein.  *See table(s) above for measurements and observations.  Diagnosing physician: Monica Martinez MD Electronically signed by Monica Martinez MD on 08/26/2019 at 12:49:19 PM.    Final       Cardiac Studies   08/23/2019: TEE IMPRESSIONS  1. Left ventricular ejection fraction, by estimation, is 55 to 60%. The  left ventricle has normal function. The left ventricle has no regional  wall motion abnormalities.   2. Right  ventricular systolic function is normal. The right ventricular  size is normal.   3. Left atrial size was mild to moderately dilated. No left atrial/left  atrial appendage thrombus was detected.   4. Right atrial size was mildly dilated.   5. The mitral valve is normal in structure. Mild mitral valve  regurgitation.   6. Tricuspid valve regurgitation is moderate to severe.   7. The aortic valve is tricuspid. Aortic valve regurgitation is mild.   Conclusion(s)/Recommendation(s): No evidence of vegetation/infective  endocarditis on this transesophageal  echocardiogram.   Patient Profile     85 y.o. male w/PMHx of congenital deafness, CAD s/p 1986 CABG as below, 2000 inferior STEMI with subsequent  VT/ 2nd degree AVB /CHB, dual chamber PPM implantation 04/25/1998 due to CHB with change-out 2007, 2014 Medtronic CRT-D due to EF 30% despite GDMT, multiple catheterizations / PCIs, PCI/BMS placed SVG-OM 2015, known CTO of the SVG-RCA, HFrEF with recovery of EF by most recent  (55-60%, 08/2019), lymphedema, AAA and aortic root dilation, remote strokes as seen on CT imaging, permanent Afib not on [place] due to GIB, s/p transfusion / known AVMs / falls, anemia, CKDIII, prostate CA, HTN, HLD.  He has h/o recurrent group B streptococcus with recent 06/2019 admission due to sepsis with blood cultures positive for strep B sepsis. He was initially placed on broad-spectrum antibiotics, which were then consolidated to penicillin. TEE 06/2019 was performed and without evidence of vegetation on pacer wires or cardiac valves. He was IV diuresed and sent home on prolonged abx for 6-8 weeks.  Of note, ultrasound was performed of his left upper extremity due to LUE>RUE and without evidence of DVT.   He was readmitted to [facility] after near syncope 08/18/2019 and found to have recurrent group B streptococcus bacteremia. He noted a strange feeling of "coldness" in his chest, dizziness, and intermittent SOB with his near fall. During  admission, he reported melena. Subsequent 08/22/2019 enteroscopy was without evidence of active GIB.  Recovery of EF noted in 08/2019 echo. TEE  08/23/2019 was without evidence of vegetation on device or cardiac valves.  Infectious disease recommendation was for extraction of device, given recurrent bacteremia despite prolonged course of antibiotics.  Case was thus discussed with Dr. [name] of [facility] given multiple comorbid conditions and likely high risk extraction. EP reviewed the case with recommendation to transfer to [facility] secondary to class I indication for extraction of device  Assessment & Plan    1. Sepsis recurrent Group B streptococcus bacteremia Unknown source, of note, very poor dentition Now s/p dental extractions. Appreciate Dr. [name] Remains Afebrile with no leukocytosis BC X 2 done on 08/20/19 were negative repeat BC (x2) done on 08/26/19 after pacemaker removal negative x3 days (one bottle with inadequate volume) TEE was performed on 6/9 with no evidence of vegetations  ID signed off 08/27/19 noting: Continue with ceftriaxone through 09/22/19.  PICC line order placed for tomorrow for left arm placement (done) OPAT orders in IV HH orders under consult He will follow up with ID, Dr. [name] at [facility]  2. CHB CRT-D system extraction 08/25/2019 Has temp-perm L IJ (dressing changed 6/16) He also has a temp wire via R groin Plan for re-implant tomorrow. On board. Per Dr. [name] will leave temp in place until then.   3. Left base PNA Currently on 2L n/c, afebrile IM managing.  ? If more CHF then anything Getting IV Ceftriaxone for infection Supplemental O2 as needed, duoneb prn, inhalers   4. Acute on chronic diastolic HF CXR as above Recent TEE with EF of 55-60% Remains edematous.  Will need to restart home torsemide, cozaar, aldactone once BP permits gettiing IV lasix, Creat stable Continue BID IV lasix  K+ 4.6 this am.    5. LUE edema USS negative for  DVT in 06/2019, repeat here showed no DVT, did find acute superficial vein thrombosis Conservative management per IM Unable to receive AC due to GIB   6. Acute on chronic blood loss anemia with iron def Baseline hemoglobin between 7-8 S/P SB capsule endoscopy without any source of bleeding S/P IV iron, 2U PRBC (earlier in his stay) Per IM. Hgb 8.0 this am. DVT prophylaxis  resumed yesterday H/H stable  7. Permanent Afib Rate controlled, paced.  Currently not on [facility] due to GIB   8. CAD/ CABG, DES/ HLD No ischemic symptoms.  Continue statin   9. HTN Stable to relative hypotension.    10. CKD stage 3a Cr stable at 1.08 today.   11. CT noted ? Nasal passage mass Will defer to medicine team thought and recommendations likely outpatient w/u via his PMD No change  For questions or updates, please contact [place] Please consult [email or web address] for contact info under    Signed, Annamaria Helling  08/31/2019, 11:37 AM    EP Attending  Patient seen and examined. Agree with the findings as noted above. The patient remains stable. He will undergon insertion of a new biv ICD on 6/18. I might have considered insertion of a biv PPM but his TR made placement of a standard PM lead very difficult and I would like to use an ICD for the extra mass.   Mikle Bosworth.D.

## 2019-08-31 NOTE — Progress Notes (Signed)
POST OPERATIVE NOTE:  08/31/2019 [name] [id number]  VITALS: BP 105/64 (BP Location: Left Arm)   Pulse 60   Temp (!) 97.3 F (36.3 C)   Resp (!) 22   Ht 6\' 2"  (1.88 m)   Wt 97.6 kg   SpO2 93%   BMI 27.63 kg/m   LABS:  Lab Results  Component Value Date   WBC 6.7 08/31/2019   HGB 8.0 (L) 08/31/2019   HCT 27.4 (L) 08/31/2019   MCV 86.2 08/31/2019   PLT 245 08/31/2019   BMET    Component Value Date/Time   NA 139 08/31/2019 0225   NA 146 (H) 03/28/2018 1625   NA 140 02/14/2014 0546   K 4.6 08/31/2019 0225   K 4.1 02/14/2014 0546   CL 104 08/31/2019 0225   CL 107 02/14/2014 0546   CO2 27 08/31/2019 0225   CO2 25 02/14/2014 0546   GLUCOSE 104 (H) 08/31/2019 0225   GLUCOSE 103 (H) 02/14/2014 0546   BUN 17 08/31/2019 0225   BUN 33 (H) 03/28/2018 1625   BUN 30 (H) 02/14/2014 0546   CREATININE 1.08 08/31/2019 0225   CREATININE 1.24 02/14/2014 0546   CALCIUM 7.7 (L) 08/31/2019 0225   CALCIUM 9.6 02/14/2014 0546   GFRNONAA >60 08/31/2019 0225   GFRNONAA >60 02/14/2014 0546   GFRNONAA 41 (L) 09/14/2012 2047   GFRAA >60 08/31/2019 0225   GFRAA >60 02/14/2014 0546   GFRAA 47 (L) 09/14/2012 2047    Lab Results  Component Value Date   INR 1.3 (H) 08/24/2019   INR 1.6 (H) 08/18/2019   INR 1.4 (H) 06/16/2019   No results found for: PTT   Thomas [name] is status post extraction of 3 teeth with the veloplasty and gross to being remaining dentition the operating with general anesthesia on 08/30/19.  SUBJECTIVE: Patient with minimal complaints from dental extraction sites.  Informed patient of the need for extraction tooth #28 secondary to tooth mobility and 31 secondary to extensive dental caries.  Patient understands and accepts the need for extraction of these 2 teeth.    EXAM: There is no sign of infection, hematoma or ooze.  Sutures are intact , clots are present.     ASSESSMENT: Post operative course is consistent with dental procedures performed in the  operating with general anesthesia. Loss of teeth due to extractions.  PLAN: 1.  Use chlorhexidine rinses after breakfast and at bedtime. 2.  Advance diet as tolerated. 3.  Patient is cleared for surgery with Dr. [name] .  This is to be scheduled at his discretion.    [name], DDS

## 2019-08-31 NOTE — Plan of Care (Signed)
  Problem: Education: Goal: Knowledge of General Education information will improve Description: Including pain rating scale, medication(s)/side effects and non-pharmacologic comfort measures Outcome: Progressing   Problem: Health Behavior/Discharge Planning: Goal: Ability to manage health-related needs will improve Outcome: Progressing   Problem: Clinical Measurements: Goal: Ability to maintain clinical measurements within normal limits will improve Outcome: Progressing Goal: Will remain free from infection Outcome: Progressing Goal: Diagnostic test results will improve Outcome: Progressing Goal: Respiratory complications will improve Outcome: Progressing Goal: Cardiovascular complication will be avoided Outcome: Progressing   Problem: Activity: Goal: Risk for activity intolerance will decrease Outcome: Progressing   Problem: Nutrition: Goal: Adequate nutrition will be maintained Outcome: Progressing   Problem: Coping: Goal: Level of anxiety will decrease Outcome: Progressing   Problem: Elimination: Goal: Will not experience complications related to bowel motility Outcome: Progressing Goal: Will not experience complications related to urinary retention Outcome: Progressing   Problem: Pain Managment: Goal: General experience of comfort will improve Outcome: Progressing   Problem: Safety: Goal: Ability to remain free from injury will improve Outcome: Progressing   Problem: Skin Integrity: Goal: Risk for impaired skin integrity will decrease Outcome: Progressing   Problem: Cardiovascular: Goal: Ability to achieve and maintain adequate cardiovascular perfusion will improve Outcome: Progressing Goal: Vascular access site(s) Level 0-1 will be maintained Outcome: Progressing   Problem: Education: Goal: Knowledge of cardiac device and self-care will improve Outcome: Progressing Goal: Ability to safely manage health related needs after discharge will  improve Outcome: Progressing Goal: Individualized Educational Video(s) Outcome: Progressing   Problem: Cardiac: Goal: Ability to achieve and maintain adequate cardiopulmonary perfusion will improve Outcome: Progressing

## 2019-09-01 ENCOUNTER — Inpatient Hospital Stay (HOSPITAL_COMMUNITY)
Admission: AD | Disposition: A | Discharge status: Skilled Nursing Facility | Payer: Self-pay | Source: Other Acute Inpatient Hospital | Attending: Internal Medicine

## 2019-09-01 ENCOUNTER — Inpatient Hospital Stay (HOSPITAL_COMMUNITY): Payer: PPO

## 2019-09-01 DIAGNOSIS — I482 Chronic atrial fibrillation, unspecified: Secondary | ICD-10-CM | POA: Diagnosis not present

## 2019-09-01 DIAGNOSIS — I5022 Chronic systolic (congestive) heart failure: Secondary | ICD-10-CM | POA: Diagnosis not present

## 2019-09-01 DIAGNOSIS — I442 Atrioventricular block, complete: Secondary | ICD-10-CM | POA: Diagnosis not present

## 2019-09-01 HISTORY — PX: BIV ICD INSERTION CRT-D: EP1195

## 2019-09-01 LAB — GLUCOSE, CAPILLARY
Glucose-Capillary: 88 mg/dL (ref 70–99)
Glucose-Capillary: 91 mg/dL (ref 70–99)

## 2019-09-01 LAB — CBC WITH DIFFERENTIAL/PLATELET
Abs Immature Granulocytes: 0.03 10*3/uL (ref 0.00–0.07)
Basophils Absolute: 0 10*3/uL (ref 0.0–0.1)
Basophils Relative: 1 %
Eosinophils Absolute: 0.2 10*3/uL (ref 0.0–0.5)
Eosinophils Relative: 3 %
HCT: 26.5 % — ABNORMAL LOW (ref 39.0–52.0)
Hemoglobin: 7.7 g/dL — ABNORMAL LOW (ref 13.0–17.0)
Immature Granulocytes: 1 %
Lymphocytes Relative: 13 %
Lymphs Abs: 0.7 10*3/uL (ref 0.7–4.0)
MCH: 25.3 pg — ABNORMAL LOW (ref 26.0–34.0)
MCHC: 29.1 g/dL — ABNORMAL LOW (ref 30.0–36.0)
MCV: 87.2 fL (ref 80.0–100.0)
Monocytes Absolute: 0.5 10*3/uL (ref 0.1–1.0)
Monocytes Relative: 10 %
Neutro Abs: 4.1 10*3/uL (ref 1.7–7.7)
Neutrophils Relative %: 72 %
Platelets: 215 10*3/uL (ref 150–400)
RBC: 3.04 MIL/uL — ABNORMAL LOW (ref 4.22–5.81)
RDW: 21.1 % — ABNORMAL HIGH (ref 11.5–15.5)
WBC: 5.6 10*3/uL (ref 4.0–10.5)
nRBC: 0 % (ref 0.0–0.2)

## 2019-09-01 LAB — BASIC METABOLIC PANEL
Anion gap: 5 (ref 5–15)
BUN: 17 mg/dL (ref 8–23)
CO2: 30 mmol/L (ref 22–32)
Calcium: 7.8 mg/dL — ABNORMAL LOW (ref 8.9–10.3)
Chloride: 103 mmol/L (ref 98–111)
Creatinine, Ser: 1.09 mg/dL (ref 0.61–1.24)
GFR calc Af Amer: >60 mL/min (ref >60)
GFR calc non Af Amer: >60 mL/min (ref >60)
Glucose, Bld: 85 mg/dL (ref 70–99)
Potassium: 4 mmol/L (ref 3.5–5.1)
Sodium: 138 mmol/L (ref 135–145)

## 2019-09-01 SURGERY — BIV ICD INSERTION CRT-D

## 2019-09-01 MED ORDER — FENTANYL CITRATE (PF) 100 MCG/2ML IJ SOLN
INTRAMUSCULAR | Status: DC | PRN
  Administered 2019-09-01 (×6): 12.5 ug via INTRAVENOUS

## 2019-09-01 MED ORDER — CEFAZOLIN SODIUM-DEXTROSE 2-4 GM/100ML-% IV SOLN
INTRAVENOUS | Status: AC
  Filled 2019-09-01: qty 100

## 2019-09-01 MED ORDER — FENTANYL CITRATE (PF) 100 MCG/2ML IJ SOLN
INTRAMUSCULAR | Status: AC
  Filled 2019-09-01: qty 2

## 2019-09-01 MED ORDER — MAGNESIUM HYDROXIDE 400 MG/5ML PO SUSP: 30.0000 mL | Freq: Once | ORAL | Status: DC

## 2019-09-01 MED ORDER — SODIUM CHLORIDE 0.9 % IV SOLN
INTRAVENOUS | Status: AC
  Filled 2019-09-01: qty 2

## 2019-09-01 MED ORDER — ONDANSETRON HCL 4 MG/2ML IJ SOLN: 4.0000 mg | Freq: Four times a day (QID) | INTRAMUSCULAR | Status: DC | PRN

## 2019-09-01 MED ORDER — IOHEXOL 350 MG/ML SOLN
INTRAVENOUS | Status: DC | PRN
  Administered 2019-09-01: 30 mL

## 2019-09-01 MED ORDER — MIDAZOLAM HCL 5 MG/5ML IJ SOLN
INTRAMUSCULAR | Status: DC | PRN
  Administered 2019-09-01 (×6): 1 mg via INTRAVENOUS

## 2019-09-01 MED ORDER — LIDOCAINE HCL 1 % IJ SOLN
INTRAMUSCULAR | Status: AC
  Filled 2019-09-01: qty 60

## 2019-09-01 MED ORDER — CEFAZOLIN SODIUM-DEXTROSE 2-3 GM-%(50ML) IV SOLR
INTRAVENOUS | Status: AC | PRN
  Administered 2019-09-01: 2 g via INTRAVENOUS

## 2019-09-01 MED ORDER — HEPARIN (PORCINE) IN NACL 1000-0.9 UT/500ML-% IV SOLN
INTRAVENOUS | Status: DC | PRN
  Administered 2019-09-01: 500 mL

## 2019-09-01 MED ORDER — LIDOCAINE HCL (PF) 1 % IJ SOLN
INTRAMUSCULAR | Status: DC | PRN
  Administered 2019-09-01: 55 mL

## 2019-09-01 MED ORDER — HEPARIN (PORCINE) IN NACL 1000-0.9 UT/500ML-% IV SOLN
INTRAVENOUS | Status: AC
  Filled 2019-09-01: qty 1000

## 2019-09-01 MED ORDER — MIDAZOLAM HCL 5 MG/5ML IJ SOLN
INTRAMUSCULAR | Status: AC
  Filled 2019-09-01: qty 5

## 2019-09-01 MED ORDER — ACETAMINOPHEN 325 MG PO TABS: 325.0000 mg | ORAL_TABLET | ORAL | Status: DC | PRN

## 2019-09-01 SURGICAL SUPPLY — 16 items
BALLN ATTAIN 80 (BALLOONS) ×2
BALLN ATTAIN 80CM 6215 (BALLOONS) ×1
BALLOON ATTAIN 80 (BALLOONS) ×1 IMPLANT
CABLE SURGICAL S-101-97-12 (CABLE) ×3 IMPLANT
CATH ATTAIN COM SURV 6250V-EH (CATHETERS) ×3 IMPLANT
CATH HEX JOSEPH 2-5-2 65CM 6F (CATHETERS) ×3 IMPLANT
ICD CLARIA MRI DTMA1Q1 (ICD Generator) ×3 IMPLANT
LEAD ATTAIN PERFORM ST 4398-88 (Lead) ×3 IMPLANT
LEAD SPRINT QUAT SEC 6935-65CM (Lead) ×3 IMPLANT
PAD PRO RADIOLUCENT 2001M-C (PAD) ×3 IMPLANT
SHEATH 9.5FR PRELUDE SNAP 13 (SHEATH) ×3 IMPLANT
SHEATH 9FR PRELUDE SNAP 13 (SHEATH) ×3 IMPLANT
SLITTER 6232ADJ (MISCELLANEOUS) ×3 IMPLANT
TRAY PACEMAKER INSERTION (PACKS) ×3 IMPLANT
WIRE ACUITY WHISPER EDS 4648 (WIRE) ×3 IMPLANT
WIRE MAILMAN 182CM (WIRE) ×3 IMPLANT

## 2019-09-01 NOTE — Discharge Instructions (Signed)
MOUTH CARE AFTER SURGERY ° °FACTS: °· Ice used in ice bag helps keep the swelling down, and can help lessen the pain. °· It is easier to treat pain BEFORE it happens. °· Spitting disturbs the clot and may cause bleeding to start again, or to get worse. °· Smoking delays healing and can cause complications. °· Sharing prescriptions can be dangerous.  Do not take medications not recently prescribed for you. °· Antibiotics may stop birth control pills from working.  Use other means of birth control while on antibiotics. °· Warm salt water rinses after the first 24 hours will help lessen the swelling:  Use 1/2 teaspoonful of table salt per oz.of water. ° °DO NOT: °· Do not spit.  Do not drink through a straw. °· Strongly advised not to smoke, dip snuff or chew tobacco at least for 3 days. °· Do not eat sharp or crunchy foods.  Avoid the area of surgery when chewing. °· Do not stop your antibiotics before your instructions say to do so. °· Do not eat hot foods until bleeding has stopped.  If you need to, let your food cool down to room temperature. ° °EXPECT: °· Some swelling, especially first 2-3 days. °· Soreness or discomfort in varying degrees.  Follow your dentist's instructions about how to handle pain before it starts. °· Pinkish saliva or light blood in saliva, or on your pillow in the morning.  This can last around 24 hours. °· Bruising inside or outside the mouth.  This may not show up until 2-3 days after surgery.  Don't worry, it will go away in time. °· Pieces of "bone" may work themselves loose.  It's OK.  If they bother you, let us know. ° °WHAT TO DO IMMEDIATELY AFTER SURGERY: °· Bite on the gauze with steady pressure for 1-2 hours.  Don't chew on the gauze. °· Do not lie down flat.  Raise your head support especially for the first 24 hours. °· Apply ice to your face on the side of the surgery.  You may apply it 20 minutes on and a few minutes off.  Ice for 8-12 hours.  You may use ice up to 24  hours. °· Before the numbness wears off, take a pain pill as instructed. °· Prescription pain medication is not always required. ° °SWELLING: °· Expect swelling for the first couple of days.  It should get better after that. °· If swelling increases 3 days or so after surgery; let us know as soon as possible. ° °FEVER: °· Take Tylenol every 4 hours if needed to lower your temperature, especially if it is at 100F or higher. °· Drink lots of fluids. °· If the fever does not go away, let us know. ° °BREATHING TROUBLE: °· Any unusual difficulty breathing means you have to have someone bring you to the emergency room ASAP ° °BLEEDING: °· Light oozing is expected for 24 hours or so. °· Prop head up with pillows °· Avoid spitting °· Do not confuse bright red fresh flowing blood with lots of saliva colored with a little bit of blood. °· If you notice some bleeding, place gauze or a tea bag where it is bleeding and apply CONSTANT pressure by biting down for 1 hour.  Avoid talking during this time.  Do not remove the gauze or tea bag during this hour to "check" the bleeding. °· If you notice bright RED bleeding FLOWING out of particular area, and filling the floor of your mouth, put   a wad of gauze on that area, bite down firmly and constantly.  Call us immediately.  If we're closed, have someone bring you to the emergency room.  ORAL HYGIENE:  Brush your teeth as usual after meals and before bedtime.  Use a soft toothbrush around the area of surgery.  DO NOT AVOID BRUSHING.  Otherwise bacteria(germs) will grow and may delay healing or encourage infection.  Since you cannot spit, just gently rinse and let the water flow out of your mouth.  DO NOT SWISH HARD.  EATING:  Cool liquids are a good point to start.  Increase to soft foods as tolerated.  PRESCRIPTIONS:  Follow the directions for your prescriptions exactly as written.  If Dr. [name] gave you a narcotic pain medication, do not drive, operate  machinery or drink alcohol when on that medication.  QUESTIONS:  Call our office during office hours [phone] or call the Emergency Room at [phone].            After Your Pacemaker   ACTIVITY . Do not lift your arm above shoulder height for 1 week after your procedure. After 7 days, you may progress as below.     Friday September 08, 2019  Saturday September 09, 2019 Sunday September 10, 2019 Monday September 11, 2019   . Do not lift, push, pull, or carry anything over 10 pounds with the affected arm until 6 weeks (Friday October 13, 2019 ) after your procedure.   . Do NOT DRIVE until you have been seen for your wound check, or as long as instructed by your healthcare provider.   . Ask your healthcare provider when you can go back to work   INCISION/Dressing . If you are on a blood thinner such as Coumadin, Xarelto, Eliquis, Plavix, or Pradaxa please confirm with your provider when this should be resumed.   . Monitor your Pacemaker site for redness, swelling, and drainage. Call the device clinic at [phone] if you experience these symptoms or fever/chills.  . If your incision is sealed with Steri-strips or staples, you may shower 10 days after your procedure or when told by your provider. Do not remove the steri-strips or let the shower hit directly on your site. You may wash around your site with soap and water.    [name] Avoid lotions, ointments, or perfumes over your incision until it is well-healed.  . You may use a hot tub or a pool AFTER your wound check appointment if the incision is completely closed.  [name] PAcemaker Alerts:  Some alerts are vibratory and others beep. These are NOT emergencies. Please call our office to let us know. If this occurs at night or on weekends, it can wait until the next business day. Send a remote transmission.  . If your device is capable of reading fluid status (for heart failure), you will be offered monthly monitoring to review this with you.   DEVICE  MANAGEMENT . Remote monitoring is used to monitor your pacemaker from home. This monitoring is scheduled every 91 days by our office. It allows [place] to keep an eye on the functioning of your device to ensure it is working properly. You will routinely see your Electrophysiologist annually (more often if necessary).   . You should receive your ID card for your new device in 4-8 weeks. Keep this card with you at all times once received. Consider wearing a medical alert bracelet or necklace.  . Your Pacemaker may be MRI compatible. This will be  discussed at your next office visit/wound check.  You should avoid contact with strong electric or magnetic fields.    Do not use amateur (ham) radio equipment or electric (arc) welding torches. MP3 player headphones with magnets should not be used. Some devices are safe to use if held at least 12 inches (30 cm) from your Pacemaker. These include power tools, lawn mowers, and speakers. If you are unsure if something is safe to use, ask your health care provider.   When using your cell phone, hold it to the ear that is on the opposite side from the Pacemaker. Do not leave your cell phone in a pocket over the Pacemaker.   You may safely use electric blankets, heating pads, computers, and microwave ovens.  Call the office right away if:  You have chest pain.  You feel more short of breath than you have felt before.  You feel more light-headed than you have felt before.  Your incision starts to open up.  This information is not intended to replace advice given to you by your health care provider. Make sure you discuss any questions you have with your health care provider.

## 2019-09-01 NOTE — Progress Notes (Signed)
OT Cancellation Note  Patient Details Name: Thomas [name] MRN: [id number] DOB: Jan 04, 1935   Cancelled Treatment:    Reason Eval/Treat Not Completed: Medical issues which prohibited therapy;Other (comment); pt remains with fem sheath and temp pacer with scheduled pacer placement this pm. Will follow up as able  Lou Cal, [place] Pager [phone] Office [place] 09/01/2019, 8:59 AM

## 2019-09-01 NOTE — Plan of Care (Signed)
  Problem: Education: Goal: Knowledge of General Education information will improve Description: Including pain rating scale, medication(s)/side effects and non-pharmacologic comfort measures Outcome: Progressing   Problem: Health Behavior/Discharge Planning: Goal: Ability to manage health-related needs will improve Outcome: Progressing   Problem: Clinical Measurements: Goal: Ability to maintain clinical measurements within normal limits will improve Outcome: Progressing Goal: Will remain free from infection Outcome: Progressing Goal: Diagnostic test results will improve Outcome: Progressing Goal: Respiratory complications will improve Outcome: Progressing Goal: Cardiovascular complication will be avoided Outcome: Progressing   Problem: Activity: Goal: Risk for activity intolerance will decrease Outcome: Progressing   Problem: Nutrition: Goal: Adequate nutrition will be maintained Outcome: Progressing   Problem: Coping: Goal: Level of anxiety will decrease Outcome: Progressing   Problem: Elimination: Goal: Will not experience complications related to bowel motility Outcome: Progressing Goal: Will not experience complications related to urinary retention Outcome: Progressing   Problem: Pain Managment: Goal: General experience of comfort will improve Outcome: Progressing   Problem: Safety: Goal: Ability to remain free from injury will improve Outcome: Progressing   Problem: Skin Integrity: Goal: Risk for impaired skin integrity will decrease Outcome: Progressing   Problem: Cardiovascular: Goal: Ability to achieve and maintain adequate cardiovascular perfusion will improve Outcome: Progressing Goal: Vascular access site(s) Level 0-1 will be maintained Outcome: Progressing   Problem: Education: Goal: Knowledge of cardiac device and self-care will improve Outcome: Progressing Goal: Ability to safely manage health related needs after discharge will  improve Outcome: Progressing Goal: Individualized Educational Video(s) Outcome: Progressing   Problem: Cardiac: Goal: Ability to achieve and maintain adequate cardiopulmonary perfusion will improve Outcome: Progressing

## 2019-09-01 NOTE — Progress Notes (Signed)
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 At [facility] patient was followed by both Dr. Tama High with ID and Dr. [name] with cardiology.  TEE was performed on 6/9 with no evidence of vegetation.  Despite this finding, infectious disease recommended transfer to [facility] for removal of ICD considering recurrent group B strep bacteremia.  During patient hospital course, patient also was found to be anemic status post 2 unit of packed red blood cells.  Dr. [name] with GI was consulted who performed a small bowel enteroscopy revealing no evidence of significant pathology in the duodenum, jejunum, esophagus or stomach.  On 08/25/2019 patient underwent extraction of the biventricular ICD system along with [place] right ventricular pacing lead and removal of the biventricular ICD with insertion of a new temp perm transvenous pacemaker.  Dr. Dorothyann Gibbs dentist was consulted for dental caries extraction.  Patient underwent procedure multiple extraction of tooth #2, 28, 31.  2 quadrants of alveoloplasty.  On 6/16.     Assessment & Plan:   Principal Problem:   Bacteremia due to group B Streptococcus Active Problems:   Cardiomyopathy, ischemic   Permanent atrial fibrillation (HCC)    Benign essential HTN   Mixed hyperlipidemia   Iron deficiency anemia due to chronic blood loss   Coronary artery disease of bypass graft of native heart with stable angina pectoris (HCC)   Acute renal failure superimposed on stage 3a chronic kidney disease (HCC)   Sepsis due to group B Streptococcus (HCC)   Duodenal arteriovenous malformation   Candidal intertrigo   Edema of left upper extremity   Pneumonia of left lower lobe due to group B Streptococcus (HCC)  1-Septic shock likely secondary to group B streptococcus bacteremia Unknown source, but patient with very poor dentition. EEG on 6/9 no evidence of vegetation Blood cultures on 08/26/2019 left after pacemaker removal no growth to date. Patient underwent extraction of the pacemaker on 6/11, he currently has an temporary pacer. On IV Rocephin.  He needs IV antibiotics through 70/11/2019 Plan to have insertion of Pacemaker/defibrilator today.   2-Left base pneumonia: Currently on room air. Continue with IV ceftriaxone Nebulizer  3-Acute on chronic systolic heart failure: Recent TEE show ejection fraction 55% EP/cardiology on board.  On IV lasix.  Plan is to start torsemide, Cozaar and Aldactone once blood pressure stabilized  Left upper extremity edema: Ultrasound negative for DVT 06/2019.  Repeat showed acute superficial vein thrombosis  Acute on chronic blood loss anemia with iron deficiency: Status post capsule endoscopy without any source of bleeding Status post IV iron, 2 unit of packed red blood cell Hb stable   Permanent A. fib: Rate control No anticoagulation due to GI bleed  CAD/CABG, DES Continue with statins  Hypertension Holding Cozaar due to blood pressure being soft  CKD stage IIIa: Monitor Hypokalemia: Replete orally  Soft mass upper nasal cavity B/L ; ENT consulted.   Pressure Injury 06/21/19 Buttocks Mid Stage 2 -  Partial thickness loss of dermis presenting as a shallow open injury with a red, pink  wound bed without slough. (Active)  06/21/19 0700  Location: Buttocks  Location Orientation: Mid  Staging: Stage 2 -  Partial thickness loss of dermis presenting as a shallow open injury with a red, pink wound bed without slough.  Wound Description (Comments):   Present on Admission:                    Estimated body mass index is 27.74 kg/m as calculated from the following:   Height as of this encounter: 6\' 2"  (1.88 m).   Weight as of this encounter: 98 kg.   DVT prophylaxis: SCDs no anticoagulation due to GI bleed Code Status: Full code Family Communication: Disposition Plan:  Status is: Inpatient  Remains inpatient appropriate because:Hemodynamically unstable   Dispo: The patient is from: Home              Anticipated d/c is to: Home              Anticipated d/c date is: 2 days              Patient currently is not medically stable to d/c.        Consultants:   EP  Cardiology  ID  Procedures:   Removal of AICD  SB enteroscopy  Multiple extraction of tooth #2, 28, 31 2 quadrants of alveoloplasty.  Gross debridement of remaining dentition.  Antimicrobials:  Ceftriaxone  Subjective: Use translator. No new complaints. He has not had BM.  Eating ok.  Denies dyspnea.   Objective: Vitals:   09/01/19 0900 09/01/19 1000 09/01/19 1100 09/01/19 1200  BP: (!) 107/56 (!) 106/58 (!) 105/56 (!) 105/54  Pulse: 60 61 60 60  Resp: 17 20 14 20   Temp:  98 F (36.7 C)    TempSrc:  Oral    SpO2: 97% 98% 98% 96%  Weight:      Height:        Intake/Output Summary (Last 24 hours) at 09/01/2019 1359 Last data filed at 09/01/2019 1200 Gross per 24 hour  Intake 1279.82 ml  Output 950 ml  Net 329.82 ml   Filed Weights   08/30/19 0500 08/31/19 0500 09/01/19 0500  Weight: 95.6 kg 97.6 kg 98 kg    Examination:  General exam: NAD Respiratory system: B/L crackles.  Cardiovascular system: S 1, S 2 RRR Gastrointestinal system: BS present, soft,  nt Central nervous system: ALert Extremities: Mild edema   Data Reviewed: I have personally reviewed following labs and imaging studies  CBC: Recent Labs  Lab 08/28/19 0350 08/29/19 0337 08/30/19 0206 08/31/19 0225 09/01/19 0341  WBC 6.2 6.6 6.5 6.7 5.6  NEUTROABS 4.7 5.1 4.8 5.5 4.1  HGB 7.7* 7.7* 7.7* 8.0* 7.7*  HCT 26.6* 26.4* 26.0* 27.4* 26.5*  MCV 87.2 86.3 84.1 86.2 87.2  PLT 194 207 198 245 449   Basic Metabolic Panel: Recent Labs  Lab 08/26/19 0201 08/27/19 0505 08/28/19 0350 08/29/19 0337 08/30/19 0206 08/31/19 0225 09/01/19 0341  NA 142   < > 138 138 139 139 138  K 4.0   < > 3.8 3.8 3.4* 4.6 4.0  CL 108   < > 106 103 103 104 103  CO2 25   < > 27 27 28 27 30   GLUCOSE 124*   < >  111* 98 96 104* 85  BUN 12   < > 20 17 16 17 17   CREATININE 1.13   < > 1.30* 1.10 1.06 1.08 1.09  CALCIUM 7.2*   < > 7.6* 7.7* 7.7* 7.7* 7.8*  MG 1.9  --   --   --   --   --   --    < > = values in this interval not displayed.   GFR: Estimated Creatinine Clearance: 60.7 mL/min (by C-G formula based on SCr of 1.09 mg/dL). Liver Function Tests: No results for input(s): AST, ALT, ALKPHOS, BILITOT, PROT, ALBUMIN in the last 168 hours. No results for input(s): LIPASE, AMYLASE in the last 168 hours. No results for input(s): AMMONIA in the last 168 hours. Coagulation Profile: No results for input(s): INR, PROTIME in the last 168 hours. Cardiac Enzymes: No results for input(s): CKTOTAL, CKMB, CKMBINDEX, TROPONINI in the last 168 hours. BNP (last 3 results) No results for input(s): PROBNP in the last 8760 hours. HbA1C: No results for input(s): HGBA1C in the last 72 hours. CBG: Recent Labs  Lab 08/26/19 0455 08/30/19 0702 08/31/19 0513 09/01/19 0514  GLUCAP 114* 99 106* 88   Lipid Profile: No results for input(s): CHOL, HDL, LDLCALC, TRIG, CHOLHDL, LDLDIRECT in the last 72 hours. Thyroid Function Tests: No results for input(s): TSH, T4TOTAL, FREET4, T3FREE, THYROIDAB in the  last 72 hours. Anemia Panel: No results for input(s): VITAMINB12, FOLATE, FERRITIN, TIBC, IRON, RETICCTPCT in the last 72 hours. Sepsis Labs: No results for input(s): PROCALCITON, LATICACIDVEN in the last 168 hours.  Recent Results (from the past 240 hour(s))  Surgical PCR screen     Status: None   Collection Time: 08/25/19 11:53 AM   Specimen: Nasal Mucosa; Nasal Swab  Result Value Ref Range Status   MRSA, PCR NEGATIVE NEGATIVE Final   Staphylococcus aureus NEGATIVE NEGATIVE Final    Comment: (NOTE) The Xpert SA Assay (FDA approved for NASAL specimens in patients 38 years of age and older), is one component of a comprehensive surveillance program. It is not intended to diagnose infection nor to guide or monitor treatment. Performed at [facility], [place] [address] [place], [place] [address]   Culture, blood (routine x 2)     Status: None   Collection Time: 08/26/19  6:26 AM   Specimen: BLOOD LEFT HAND  Result Value Ref Range Status   Specimen Description BLOOD LEFT HAND  Final   Special Requests   Final    BOTTLES DRAWN AEROBIC AND ANAEROBIC Blood Culture results may not be optimal due to an inadequate volume of blood received in culture bottles   Culture   Final    NO GROWTH 5 DAYS Performed at [facility], [place] [address], [place], [place] [address]    Report Status 08/31/2019 FINAL  Final  Culture, blood (routine x 2)     Status: None   Collection Time: 08/26/19  6:26 AM   Specimen: BLOOD  Result Value Ref Range Status   Specimen Description BLOOD LEFT ARM  Final   Special Requests   Final    BOTTLES DRAWN AEROBIC AND ANAEROBIC Blood Culture adequate volume   Culture   Final    NO GROWTH 5 DAYS Performed at [facility], [address], [place], [place] [address]    Report Status 08/31/2019 FINAL  Final  Surgical PCR screen     Status: None   Collection Time: 08/31/19  2:20 PM   Specimen:  Nasal Mucosa; Nasal Swab  Result Value Ref Range Status    MRSA, PCR NEGATIVE NEGATIVE Final   Staphylococcus aureus NEGATIVE NEGATIVE Final    Comment: (NOTE) The Xpert SA Assay (FDA approved for NASAL specimens in patients 41 years of age and older), is one component of a comprehensive surveillance program. It is not intended to diagnose infection nor to guide or monitor treatment. Performed at [facility], [place] [address] [place], [place] [address]          Radiology Studies: No results found.      Scheduled Meds: . sodium chloride   Intravenous Once  . Chlorhexidine Gluconate Cloth  6 each Topical Daily  . ferrous sulfate  325 mg Oral Daily  . furosemide  40 mg Intravenous BID  . gentamicin irrigation  80 mg Irrigation On Call  . Gerhardt's butt cream   Topical BID  . magnesium hydroxide  30 mL Oral Once  . multivitamin with minerals  1 tablet Oral Daily  . pantoprazole  40 mg Intravenous Q12H  . polyethylene glycol  17 g Oral Daily  . senna  1 tablet Oral Daily  . simvastatin  20 mg Oral q1800  . sodium chloride flush  3 mL Intravenous Q12H   Continuous Infusions: . sodium chloride 10 mL/hr at 08/30/19 0400  . sodium chloride    . cefTRIAXone (ROCEPHIN) IVPB 2 gram/100 mL NS (Mini-Bag Plus) 2 g (08/31/19 2358)  . lactated ringers 10 mL/hr at 09/01/19 1200     LOS: 8 days    Time spent: 35 minutes.     Elmarie Shiley, MD [facility]   If 7PM-7AM, please contact night-coverage [email or web address]  09/01/2019, 1:59 PM

## 2019-09-01 NOTE — Progress Notes (Addendum)
Progress Note  Patient Name: Thomas [name] Date of Encounter: 09/01/2019  [facility] Cardiologist: Sanda Klein, MD  EP: Dr. [name]  Subjective   Today's visit is done with an in person interpretor.   Pt is feeling well and has no further questions concerning the procedure.   Inpatient Medications    Scheduled Meds: . sodium chloride   Intravenous Once  . Chlorhexidine Gluconate Cloth  6 each Topical Daily  . ferrous sulfate  325 mg Oral Daily  . furosemide  40 mg Intravenous BID  . gentamicin irrigation  80 mg Irrigation On Call  . Gerhardt's butt cream   Topical BID  . heparin injection (subcutaneous)  5,000 Units Subcutaneous Q8H  . multivitamin with minerals  1 tablet Oral Daily  . pantoprazole  40 mg Intravenous Q12H  . polyethylene glycol  17 g Oral Daily  . senna  1 tablet Oral Daily  . simvastatin  20 mg Oral q1800  . sodium chloride flush  3 mL Intravenous Q12H   Continuous Infusions: . sodium chloride 10 mL/hr at 08/30/19 0400  . sodium chloride    . cefTRIAXone (ROCEPHIN) IVPB 2 gram/100 mL NS (Mini-Bag Plus) 2 g (08/31/19 2358)  . lactated ringers 10 mL/hr at 09/01/19 0700   PRN Meds: sodium chloride, acetaminophen, acetaminophen, albuterol, dextromethorphan-guaiFENesin, HYDROcodone-acetaminophen, ondansetron (ZOFRAN) IV, sodium chloride flush   Vital Signs    Vitals:   09/01/19 0600 09/01/19 0700 09/01/19 0800 09/01/19 0900  BP: 111/61 (!) 97/57 (!) 109/55 (!) 107/56  Pulse: 62 (!) 59 (!) 59 60  Resp: (!) 22 15 (!) 22 17  Temp:      TempSrc:      SpO2: 97% 96% 96% 97%  Weight:      Height:        Intake/Output Summary (Last 24 hours) at 09/01/2019 0959 Last data filed at 09/01/2019 0700 Gross per 24 hour  Intake 1709.84 ml  Output 950 ml  Net 759.84 ml   Last 3 Weights 09/01/2019 08/31/2019 08/30/2019  Weight (lbs) 216 lb 0.8 oz 215 lb 2.7 oz 210 lb 12.2 oz  Weight (kg) 98 kg 97.6 kg 95.6 kg      Telemetry    VP, occasional  PVCs/NSVT, personally reviewed.   ECG    No new EKGs   Physical Exam   [place] remains essentially unchanged GEN: NAD Neck: No JVD Cardiac: RRR, no murmurs, rubs, or gallops. Left temp perm pacer site bandage C/D/I Respiratory: CTA bilaterally GI: Soft, NT/ND MS: 2+ edema to mid calf. Neuro:  Nonfocal (known deafness) Psych: Normal/pleasant affect   R groin: Dressing C/D/I Temp wire in place  R IJ temp perm: Dressing C/D/I   Labs    High Sensitivity Troponin:  No results for input(s): TROPONINIHS in the last 720 hours.    Chemistry Recent Labs  Lab 08/30/19 0206 08/31/19 0225 09/01/19 0341  NA 139 139 138  K 3.4* 4.6 4.0  CL 103 104 103  CO2 28 27 30   GLUCOSE 96 104* 85  BUN 16 17 17   CREATININE 1.06 1.08 1.09  CALCIUM 7.7* 7.7* 7.8*  GFRNONAA >60 >60 >60  GFRAA >60 >60 >60  ANIONGAP 8 8 5      Hematology Recent Labs  Lab 08/30/19 0206 08/31/19 0225 09/01/19 0341  WBC 6.5 6.7 5.6  RBC 3.09* 3.18* 3.04*  HGB 7.7* 8.0* 7.7*  HCT 26.0* 27.4* 26.5*  MCV 84.1 86.2 87.2  MCH 24.9* 25.2* 25.3*  MCHC 29.6*  29.2* 29.1*  RDW 20.4* 20.8* 21.1*  PLT 198 245 215    BNP No results for input(s): BNP, PROBNP in the last 168 hours.   DDimer No results for input(s): DDIMER in the last 168 hours.   Radiology    08/28/2019: CT, maxillofacial  IMPRESSION: 1. Numerous caries bilaterally. Right upper third molar caries with periapical lucency. 2. Soft tissue mass in the upper nasal cavity bilaterally. Recommend direct visualization and biopsy if appropriate   DG CHEST PORT 1 VIEW Result Date: 08/28/2019 CLINICAL DATA:  Chest pain EXAM: PORTABLE CHEST 1 VIEW COMPARISON:  August 26, 2019 FINDINGS: There is cardiomegaly. There is mild pulmonary venous hypertension. Pacemaker leads attached to right atrium and coronary sinus,, stable. There are persistent bilateral pleural effusions with airspace opacity in the left lower lobe, stable. No new opacity evident. There is no  evident adenopathy. There is aortic atherosclerosis. No bone lesions. IMPRESSION: Cardiomegaly with a degree of pulmonary vascular congestion. Persistent pleural effusions bilaterally with left lower lobe airspace opacity concerning for atelectasis and potential pneumonia in the left lower lung region. No new opacity evident. Pacemaker leads unchanged in position. Status post coronary artery bypass grafting. Aortic Atherosclerosis (ICD10-I70.0). Electronically Signed   By: Lowella Grip III M.D.   On: 08/28/2019 08:11    VAS [place] UPPER EXTREMITY VENOUS DUPLEX Result Date: 08/26/2019 UPPER VENOUS STUDY  Indications: Swelling Performing Technologist: June Leap RDMS, RVT  Examination Guidelines: A complete evaluation includes B-mode imaging, spectral Doppler, color Doppler, and power Doppler as needed of all accessible portions of each vessel. Bilateral testing is considered an integral part of a complete examination. Limited examinations for reoccurring indications may be performed as noted.  Summary:  Left: No evidence of deep vein thrombosis in the upper extremity. Findings consistent with acute superficial vein thrombosis involving the left cephalic vein.  *See table(s) above for measurements and observations.  Diagnosing physician: Monica Martinez MD Electronically signed by Monica Martinez MD on 08/26/2019 at 12:49:19 PM.    Final       Cardiac Studies   08/23/2019: TEE IMPRESSIONS  1. Left ventricular ejection fraction, by estimation, is 55 to 60%. The  left ventricle has normal function. The left ventricle has no regional  wall motion abnormalities.   2. Right ventricular systolic function is normal. The right ventricular  size is normal.   3. Left atrial size was mild to moderately dilated. No left atrial/left  atrial appendage thrombus was detected.   4. Right atrial size was mildly dilated.   5. The mitral valve is normal in structure. Mild mitral valve  regurgitation.   6.  Tricuspid valve regurgitation is moderate to severe.   7. The aortic valve is tricuspid. Aortic valve regurgitation is mild.   Conclusion(s)/Recommendation(s): No evidence of vegetation/infective  endocarditis on this transesophageal  echocardiogram.   Patient Profile     85 y.o. male w/PMHx of congenital deafness, CAD s/p 1986 CABG as below, 2000 inferior STEMI with subsequent VT/ 2nd degree AVB /CHB, dual chamber PPM implantation 04/25/1998 due to CHB with change-out 2007, 2014 Medtronic CRT-D due to EF 30% despite GDMT, multiple catheterizations / PCIs, PCI/BMS placed SVG-OM 2015, known CTO of the SVG-RCA, HFrEF with recovery of EF by most recent  (55-60%, 08/2019), lymphedema, AAA and aortic root dilation, remote strokes as seen on CT imaging, permanent Afib not on [place] due to GIB, s/p transfusion / known AVMs / falls, anemia, CKDIII, prostate CA, HTN, HLD.  He has h/o recurrent  group B streptococcus with recent 06/2019 admission due to sepsis with blood cultures positive for strep B sepsis. He was initially placed on broad-spectrum antibiotics, which were then consolidated to penicillin. TEE 06/2019 was performed and without evidence of vegetation on pacer wires or cardiac valves. He was IV diuresed and sent home on prolonged abx for 6-8 weeks.  Of note, ultrasound was performed of his left upper extremity due to LUE>RUE and without evidence of DVT.   He was readmitted to [facility] after near syncope 08/18/2019 and found to have recurrent group B streptococcus bacteremia. He noted a strange feeling of "coldness" in his chest, dizziness, and intermittent SOB with his near fall. During admission, he reported melena. Subsequent 08/22/2019 enteroscopy was without evidence of active GIB.  Recovery of EF noted in 08/2019 echo. TEE  08/23/2019 was without evidence of vegetation on device or cardiac valves.  Infectious disease recommendation was for extraction of device, given recurrent bacteremia despite prolonged course  of antibiotics.  Case was thus discussed with Dr. [name] of [facility] given multiple comorbid conditions and likely high risk extraction. EP reviewed the case with recommendation to transfer to [facility] secondary to class I indication for extraction of device  Assessment & Plan    1. Sepsis recurrent Group B streptococcus bacteremia Unknown source, of note, very poor dentition Now s/p dental extractions. Appreciate Dr. [name] Remains Afebrile with no leukocytosis BC X 2 done on 08/20/19 were negative repeat BC (x2) done on 08/26/19 after pacemaker removal negative x3 days (one bottle with inadequate volume) TEE was performed on 6/9 with no evidence of vegetations  ID signed off 08/27/19 noting: Continue with ceftriaxone through 09/22/19.  PICC line order placed for left arm placement (done) OPAT orders in IV HH orders under consult He will follow up with ID, Dr. [name] at [facility]  2. CHB CRT-D system extraction 08/25/2019 Has temp-perm L IJ (dressing changed 6/16) He also has a temp wire via R groin Plan for re-implantation today. Possibly with downgrade to CRT-P.    3. Left base PNA Currently on 2L n/c, afebrile IM managing.  ? If more CHF then anything Getting IV Ceftriaxone for infection Supplemental O2 as needed, duoneb prn, inhalers   4. Acute on chronic diastolic HF CXR as above Recent TEE with EF of 55-60% Remains edematous.  Will need to restart home torsemide, cozaar, aldactone once BP permits gettiing IV lasix K+ 4.0 this am. Creatine stable.    5. LUE edema USS negative for DVT in 06/2019, repeat here showed no DVT, did find acute superficial vein thrombosis Conservative management per IM Unable to receive AC due to GIB   6. Acute on chronic blood loss anemia with iron def Baseline hemoglobin between 7-8 S/P SB capsule endoscopy without any source of bleeding S/P IV iron, 2U PRBC (earlier in his stay) Per IM. Hgb 7.7 this am. DVT prophylaxis resumed   H/H stable  7. Permanent Afib Rate controlled, Paced.  Currently not on [facility] due to GIB   8. CAD/ CABG, DES/ HLD No ischemic symptoms.  Continue statin   9. HTN Stable to relative hypotension.    10. CKD stage 3a Cr stable.   11. CT noted ? Nasal passage mass Will defer to medicine team thought and recommendations likely outpatient w/u via his PMD No change  Plan for device reimplantation today and removal of temporary devices.   For questions or updates, please contact [place] Please consult [email or web address] for contact info  under    Signed, Shirley Friar, PA-C  09/01/2019, 9:59 AM

## 2019-09-01 NOTE — Progress Notes (Signed)
PT Cancellation Note  Patient Details Name: Thomas [name] MRN: [id number] DOB: 10-12-1934   Cancelled Treatment:    Reason Eval/Treat Not Completed: Patient not medically ready (pt remains with fem sheath and temp pacer with scheduled pacer placement this pm. will plan to see next date as orders dictate)   Thomas [name] 09/01/2019, 8:38 AM Bayard Males, PT Acute Rehabilitation Services Pager: [phone] Office: [phone]

## 2019-09-02 ENCOUNTER — Inpatient Hospital Stay (HOSPITAL_COMMUNITY): Payer: PPO

## 2019-09-02 DIAGNOSIS — J9811 Atelectasis: Secondary | ICD-10-CM

## 2019-09-02 LAB — TYPE AND SCREEN
ABO/RH(D): O POS
Antibody Screen: NEGATIVE
Unit division: 0
Unit division: 0

## 2019-09-02 LAB — GLUCOSE, CAPILLARY
Glucose-Capillary: 98 mg/dL (ref 70–99)
Glucose-Capillary: 98 mg/dL (ref 70–99)

## 2019-09-02 LAB — BPAM RBC
Blood Product Expiration Date: 202107172359
Blood Product Expiration Date: 202107172359
ISSUE DATE / TIME: 202106111602
Unit Type and Rh: 5100
Unit Type and Rh: 5100

## 2019-09-02 LAB — BASIC METABOLIC PANEL
Anion gap: 7 (ref 5–15)
BUN: 16 mg/dL (ref 8–23)
CO2: 30 mmol/L (ref 22–32)
Calcium: 7.8 mg/dL — ABNORMAL LOW (ref 8.9–10.3)
Chloride: 102 mmol/L (ref 98–111)
Creatinine, Ser: 1.06 mg/dL (ref 0.61–1.24)
GFR calc Af Amer: >60 mL/min (ref >60)
GFR calc non Af Amer: >60 mL/min (ref >60)
Glucose, Bld: 130 mg/dL — ABNORMAL HIGH (ref 70–99)
Potassium: 3.6 mmol/L (ref 3.5–5.1)
Sodium: 139 mmol/L (ref 135–145)

## 2019-09-02 LAB — CBC WITH DIFFERENTIAL/PLATELET
Abs Immature Granulocytes: 0.02 10*3/uL (ref 0.00–0.07)
Basophils Absolute: 0 10*3/uL (ref 0.0–0.1)
Basophils Relative: 1 %
Eosinophils Absolute: 0.1 10*3/uL (ref 0.0–0.5)
Eosinophils Relative: 2 %
HCT: 26.1 % — ABNORMAL LOW (ref 39.0–52.0)
Hemoglobin: 7.6 g/dL — ABNORMAL LOW (ref 13.0–17.0)
Immature Granulocytes: 0 %
Lymphocytes Relative: 10 %
Lymphs Abs: 0.5 10*3/uL — ABNORMAL LOW (ref 0.7–4.0)
MCH: 25.1 pg — ABNORMAL LOW (ref 26.0–34.0)
MCHC: 29.1 g/dL — ABNORMAL LOW (ref 30.0–36.0)
MCV: 86.1 fL (ref 80.0–100.0)
Monocytes Absolute: 0.5 10*3/uL (ref 0.1–1.0)
Monocytes Relative: 9 %
Neutro Abs: 4.1 10*3/uL (ref 1.7–7.7)
Neutrophils Relative %: 78 %
Platelets: 206 10*3/uL (ref 150–400)
RBC: 3.03 MIL/uL — ABNORMAL LOW (ref 4.22–5.81)
RDW: 20.9 % — ABNORMAL HIGH (ref 11.5–15.5)
WBC: 5.3 10*3/uL (ref 4.0–10.5)
nRBC: 0 % (ref 0.0–0.2)

## 2019-09-02 MED ORDER — SODIUM CHLORIDE 3 % IN NEBU
4.0000 mL | INHALATION_SOLUTION | Freq: Three times a day (TID) | RESPIRATORY_TRACT | Status: AC
  Administered 2019-09-02 – 2019-09-04 (×9): 4 mL via RESPIRATORY_TRACT
  Filled 2019-09-02 (×9): qty 4

## 2019-09-02 MED ORDER — BISACODYL 10 MG RE SUPP: 10.0000 mg | Freq: Once | RECTAL | Status: DC

## 2019-09-02 MED ORDER — FUROSEMIDE 10 MG/ML IJ SOLN
40.0000 mg | Freq: Two times a day (BID) | INTRAMUSCULAR | Status: DC
  Administered 2019-09-02 – 2019-09-03 (×3): 40 mg via INTRAVENOUS
  Filled 2019-09-02 (×3): qty 4

## 2019-09-02 MED ORDER — HEPARIN SODIUM (PORCINE) 5000 UNIT/ML IJ SOLN
5000.0000 [IU] | Freq: Three times a day (TID) | INTRAMUSCULAR | Status: DC
  Administered 2019-09-02 – 2019-09-11 (×26): 5000 [IU] via SUBCUTANEOUS
  Filled 2019-09-02 (×26): qty 1

## 2019-09-02 MED ORDER — BISACODYL 10 MG RE SUPP
10.0000 mg | Freq: Once | RECTAL | Status: AC
  Administered 2019-09-02: 10 mg via RECTAL
  Filled 2019-09-02: qty 1

## 2019-09-02 NOTE — Consult Note (Signed)
Name: Thomas [name] MRN: [id number] DOB: April 16, 1934    ADMISSION DATE:  08/24/2019 CONSULTATION DATE:  09/02/2019   REFERRING MD :  Tyrell Antonio, triad  CHIEF COMPLAINT: Abnormal x-ray  BRIEF PATIENT DESCRIPTION: 85 year old deaf man admitted with recurrent group B strep bacteremia necessitating removal of ICD/pacemaker.  PCCM consulted for atelectasis  SIGNIFICANT EVENTS  08/25/2019 extraction of the biventricular ICD system along with  right ventricular pacing lead and removal of the biventricular ICD with insertion of a new temp perm transvenous pacemaker.  6/16 dental extraction  Capsule endoscopy >> no evidence of GI bleeding  STUDIES:     HISTORY OF PRESENT ILLNESS: 85 year old this man admitted 06/2019 with group B strep bacteremia, treated with 6 weeks of antibiotics.  Readmitted 6/4 to [facility] and found to have left lower lobe pneumonia and recurrent group B strep bacteremia.  TEE 6/9 did not show any evidence of vegetation.  He underwent extraction of ICD/pacer system and transvenous pacer was placed.  He also underwent dental extraction He was maintained on IV Rocephin .  Oxygen requirements have varied from 2 to 3 L.  We are consulted for abnormal chest x-ray. Chest x-ray 6/18 showed opacification of the right upper lung with large right effusion, small left effusion also noted.  Trachea appears to be shifted to the right Chest x-ray from 6/15 showed bibasilar consolidation and effusions with cardiomegaly  Above history obtained from chart review, further history obtained via interpreter using sign language.  He denies chest pain or shortness of breath or sputum production   PMH - CAD, high-grade AV block status post Medtronic dual-chamber biventricular ICD, chronic systolic heart failure ejection fraction as low as 30%, recent ejection fraction 55% on TEE on 08/23/2019, stage III CKD, chronic A. 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  06/2019  PAST MEDICAL HISTORY :   has a past medical history of AICD (automatic cardioverter/defibrillator) present, Bacteremia due to group B Streptococcus, Biventricular ICD (implantable cardioverter-defibrillator) in place (03/24/2005), CHF (congestive heart failure) ([place]), CKD (chronic kidney disease), stage III, Coronary artery disease, Deaf, Dysrhythmia, History of abdominal aortic aneurysm, History of bleeding peptic ulcer (1980), History of epididymitis (2013), HTN (hypertension), Hydronephrosis with ureteropelvic junction obstruction, Hydroureter on left (2009), Hypertension, Ischemic cardiomyopathy, Moderate tricuspid regurgitation, PAF (paroxysmal atrial fibrillation) ([place]), Presence of permanent cardiac pacemaker (2002), Prostate cancer [facility]), Status post coronary artery bypass grafting (1986), and Testicular swelling.  has a past surgical history that includes Coronary artery bypass graft (1986); Insert / replace / remove pacemaker; Transurethral resection of prostate; Cardiac catheterization (12/10/2011); 2-D echocardiogram (11/20/2011); Persantine Myoview (05/06/2010); left heart catheterization with coronary/graft angiogram (N/A, 12/10/2011); bi-ventricular implantable cardioverter defibrillator (N/A, 12/16/2012); left heart catheterization with coronary/graft angiogram (N/A, 09/25/2013); Cataract extraction w/PHACO (Right, 10/12/2017); Esophagogastroduodenoscopy (egd) with propofol (N/A, 05/27/2018); Esophagogastroduodenoscopy (N/A, 07/13/2018); Colonoscopy (N/A, 07/13/2018); Esophagogastroduodenoscopy (N/A, 09/14/2018); enteroscopy (N/A, 09/14/2018); Esophagogastroduodenoscopy (N/A, 06/21/2019); TEE without cardioversion (N/A, 06/22/2019); enteroscopy (N/A, 08/22/2019); TEE without cardioversion (N/A, 08/23/2019); Icd lead removal (Left, 08/25/2019); TEE without cardioversion (N/A, 08/25/2019); and Multiple extractions with alveoloplasty (Bilateral, 08/30/2019). Prior to Admission medications   Medication Sig Start  Date End Date Taking? Authorizing Provider  acetaminophen (TYLENOL) 325 MG tablet Take 2 tablets (650 mg total) by mouth every 6 (six) hours as needed for mild pain or fever. 08/23/19   Loletha Grayer, MD  albuterol (PROVENTIL) (2.5 MG/3ML) 0.083% nebulizer solution Inhale 3 mLs into the lungs every 4 (four) hours as needed for wheezing  or shortness of breath. 08/23/19   Wieting, Richard, MD  cefTRIAXone 2 g in sodium chloride 0.9 % 100 mL Inject 2 g into the vein daily. 08/24/19   Loletha Grayer, MD  dextromethorphan-guaiFENesin [facility] DM) 30-600 MG 12hr tablet Take 1 tablet by mouth 2 (two) times daily as needed for cough. 08/23/19   Loletha Grayer, MD  ferrous sulfate 325 (65 FE) MG tablet Take 1 tablet (325 mg total) by mouth daily for 30 days. 05/28/18   Elodia Florence., MD  ipratropium-albuterol (DUONEB) 0.5-2.5 (3) MG/3ML SOLN Take 3 mLs by nebulization every 6 (six) hours. 08/23/19   Loletha Grayer, MD  losartan (COZAAR) 25 MG tablet Take 0.5 tablets (12.5 mg total) by mouth daily. 06/26/19   Danford, Suann Larry, MD  Multiple Vitamin (MULTIVITAMIN WITH MINERALS) TABS tablet Take 1 tablet by mouth daily. 05/29/19   Nicole Kindred A, DO  ondansetron (ZOFRAN) 4 MG/2ML SOLN injection Inject 2 mLs (4 mg total) into the vein every 8 (eight) hours as needed for nausea or vomiting. 08/23/19   Loletha Grayer, MD  pantoprazole (PROTONIX) 40 MG injection Inject 40 mg into the vein every 12 (twelve) hours. 08/23/19   Loletha Grayer, MD  simvastatin (ZOCOR) 20 MG tablet TAKE 1 TABLET BY MOUTH EVERY DAY Patient taking differently: Take 20 mg by mouth daily.  04/07/19   Erlene Quan, PA-C  spironolactone (ALDACTONE) 25 MG tablet Take 1 tablet (25 mg total) by mouth daily. 08/01/19 10/30/19  Alisa Graff, FNP  torsemide (DEMADEX) 20 MG tablet Take 2 tablets (40 mg total) by mouth daily. 08/23/19   Loletha Grayer, MD   Allergies  Allergen Reactions  . Entresto [Sacubitril-Valsartan] Swelling     And bruising of arm  . Phenazopyridine Nausea Only and Other (See Comments)    GI UPSET  . Ramipril Other (See Comments)    unk Other reaction(s): Other (See Comments), Unknown unk    FAMILY HISTORY:  family history includes Hypertension in his father. SOCIAL HISTORY:  reports that he quit smoking about 34 years ago. He has never used smokeless tobacco. He reports that he does not drink alcohol and does not use drugs.  REVIEW OF SYSTEMS:   Constitutional: Negative for fever, chills, weight loss, malaise/fatigue and diaphoresis.  HENT: Negative for ear pain, nosebleeds, congestion, sore throat, neck pain, tinnitus and ear discharge.  +Deafness, communicates by sign language Eyes: Negative for blurred vision, double vision, photophobia, pain, discharge and redness.  Respiratory: Negative for cough, hemoptysis, sputum production,  wheezing and stridor.   Cardiovascular: Negative for chest pain, palpitations, orthopnea, claudication, leg swelling and PND.  Gastrointestinal: Negative for heartburn, nausea, vomiting, abdominal pain, diarrhea, constipation, blood in stool and melena.  Genitourinary: Negative for dysuria, urgency, frequency, hematuria and flank pain.  Musculoskeletal: Negative for myalgias, back pain, joint pain and falls.  Skin: Negative for itching and rash.  Neurological: Negative for dizziness, tingling, tremors, sensory change, speech change, focal weakness, seizures, loss of consciousness, weakness and headaches.  Endo/Heme/Allergies: Negative for environmental allergies and polydipsia. Does not bruise/bleed easily.  SUBJECTIVE:   VITAL SIGNS: Temp:  [97.3 F (36.3 C)-98.1 F (36.7 C)] 97.7 F (36.5 C) (06/19 1629) Pulse Rate:  [63-71] 68 (06/19 1629) Resp:  [13-27] 19 (06/19 1629) BP: (114-154)/(29-55) 120/44 (06/19 1629) SpO2:  [85 %-100 %] 94 % (06/19 1339) Weight:  [96.2 kg] 96.2 kg (06/19 0530)  PHYSICAL EXAMINATION: General: Elderly man, no distress, on 3  L oxygen Neuro: Awake, interactive,  nonfocal HEENT: No JVD, no pallor or icterus Cardiovascular: S1-S2 paced rhythm Lungs: Decreased breath sounds on the right, no rhonchi, no accessory muscle use Abdomen: Soft and nontender abdomen Musculoskeletal: No deformity Skin: No rash  Recent Labs  Lab 08/31/19 0225 09/01/19 0341 09/02/19 0550  NA 139 138 139  K 4.6 4.0 3.6  CL 104 103 102  CO2 27 30 30   BUN 17 17 16   CREATININE 1.08 1.09 1.06  GLUCOSE 104* 85 130*   Recent Labs  Lab 08/31/19 0225 09/01/19 0341 09/02/19 0550  HGB 8.0* 7.7* 7.6*  HCT 27.4* 26.5* 26.1*  WBC 6.7 5.6 5.3  PLT 245 215 206   DG Chest 2 View  Result Date: 09/02/2019 CLINICAL DATA:  Implantable cardioverter-defibrillator placement. EXAM: CHEST - 2 VIEW COMPARISON:  Chest radiograph dated 09/01/2019 FINDINGS: A right subclavian approach cardiac device is redemonstrated. Sternotomy hardware and surgical clips overlying the mediastinum and heart are redemonstrated. A left upper extremity peripherally inserted central venous catheter tip overlies the superior vena cava. The heart remains enlarged. Vascular calcifications are seen in the aortic arch. There is increased aeration of the right upper lung. Small bilateral pleural effusions with associated atelectasis/airspace disease appear similar to prior exam. There is no pneumothorax. The visualized skeletal structures are unremarkable. IMPRESSION: Increased aeration of the right upper lung. Unchanged small bilateral pleural effusions with associated atelectasis/airspace disease. Electronically Signed   By: Zerita Boers M.D.   On: 09/02/2019 11:37   EP PPM/ICD IMPLANT  Result Date: 09/01/2019 Conclusion: Successful insertion of a new biventricular ICD and removal of the 2 previously implanted temporary transvenous pacing leads in a patient with complete heart block, chronic atrial fibrillation, chronic systolic heart failure in the setting of ischemic cardiomyopathy  ejection fraction 30%.  Of note the patient's decision to place an ICD lead was made based on his severe tricuspid regurgitation and severe difficulty placing pacing leads at the time of his temporary permanent insertion during his extraction procedure. Cristopher Peru, MD  DG Chest Port 1 View  Result Date: 09/01/2019 CLINICAL DATA:  ICD placement EXAM: PORTABLE CHEST 1 VIEW COMPARISON:  Radiograph 08/29/2019 FINDINGS: There is near complete opacification of the right mid to upper lung with few residual air lucencies which could reflect some aerated lung parenchyma or air bronchograms. Patchy opacity noted in the residually aerated portion of the right lower lobe. Large right pleural effusion is present. More small left pleural effusion with some posterior layering distribution. Postsurgical changes related to prior CABG including intact and aligned sternotomy hardware and multiple surgical clips projecting over the mediastinum. Stable cardiomegaly. The aorta is calcified. The remaining cardiomediastinal contours are unremarkable. Removal of the previously seen left chest wall pacer pack with interval placement of a 2 lead pacer/defibrillator device overlying the right chest wall with leads at the right atrium and coronary sinus. Telemetry leads overlie the chest. IMPRESSION: 1. Removal of the previously seen left chest wall pacer device with placement of a right chest wall pacer/AICD with leads at the cardiac apex and coronary sinus. 2. Near complete opacification of the right mid to upper lung with few residual air lucencies which could reflect some aerated lung parenchyma or air bronchograms., suspect this may be part of the large right pleural effusion with atelectatic collapse though some underlying airspace process is not excluded particularly given some patchy opacity in the residually aerated portion of the right lower lobe. 3. Small left effusion as well. 4. Stable cardiomegaly and features of prior  CABG.  Electronically Signed   By: [name] M.D.   On: 09/01/2019 20:26    ASSESSMENT / PLAN:  Acute hypoxic respiratory failure Right upper lobe atelectasis   -Needs tracheobronchial toilet with hypertonic saline nebs -Chest PT with flutter valve and via bed .  He would not be able to use vest due to pain at pacer site -Early ambulation with PT note a transvenous pacemaker is out -Repeat chest x-ray obtained 6/19 shows improved aeration right upper lobe  Group B strep bacteremia -ceftriaxone is planned through 06/16/1425 Chronic diastolic heart failure -diuresis with IV Lasix  PCCM to see again Monday  Kara Mead MD. [facility]. [place] [facility]  If no response to pager , please call [phone]    09/02/2019, 4:53 PM

## 2019-09-02 NOTE — Progress Notes (Signed)
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 At [facility] patient was followed by both Dr. Tama High with ID and Dr. [name] with cardiology.  TEE was performed on 6/9 with no evidence of vegetation.  Despite this finding, infectious disease recommended transfer to [facility] for removal of ICD considering recurrent group B strep bacteremia.  During patient hospital course, patient also was found to be anemic status post 2 unit of packed red blood cells.  Dr. [name] with GI was consulted who performed a small bowel enteroscopy revealing no evidence of significant pathology in the duodenum, jejunum, esophagus or stomach.  On 08/25/2019 patient underwent extraction of the biventricular ICD system along with [place] right ventricular pacing lead and removal of the biventricular ICD with insertion of a new temp perm transvenous pacemaker.  Dr. Dorothyann Gibbs dentist was consulted for dental caries extraction.  Patient underwent procedure multiple extraction of tooth #2, 28, 31.  2 quadrants of alveoloplasty.  On 6/16.     Assessment & Plan:   Principal Problem:   Bacteremia due to group B Streptococcus Active Problems:   Cardiomyopathy, ischemic   Permanent atrial fibrillation (HCC)    Benign essential HTN   Mixed hyperlipidemia   Iron deficiency anemia due to chronic blood loss   Coronary artery disease of bypass graft of native heart with stable angina pectoris (HCC)   Acute renal failure superimposed on stage 3a chronic kidney disease (HCC)   Sepsis due to group B Streptococcus (HCC)   Duodenal arteriovenous malformation   Candidal intertrigo   Edema of left upper extremity   Pneumonia of left lower lobe due to group B Streptococcus (HCC)  1-Septic shock likely secondary to group B streptococcus bacteremia Unknown source, but patient with very poor dentition. PNA EEG on 6/9 no evidence of vegetation Blood cultures on 08/26/2019 left after pacemaker removal no growth to date. Patient underwent extraction of the pacemaker on 6/11, he currently has an temporary pacer. On IV Rocephin.  He needs IV antibiotics through 70/11/2019 Exchange of Pacemaker/defibrilator 6/18  2-Bilateral pneumonia: Chest x ray Right upper and middle lobe complete opacification.  Flutter valve ordered.  Continue with IV ceftriaxone Nebulizer Pulmonary consulted.   3-Acute on chronic systolic heart failure: Recent TEE show ejection fraction 55% EP/cardiology on board.  On IV lasix.  Plan is to start torsemide, Cozaar and Aldactone once blood pressure stabilized Renal function stable.   Left upper extremity edema: Ultrasound negative for DVT 06/2019.  Repeat showed acute superficial vein thrombosis  Acute on chronic blood loss anemia with iron deficiency: Status post capsule endoscopy without any source of bleeding Status post IV iron, 2 unit of packed red blood cell Hb stable   Permanent A. fib: Rate control No anticoagulation due to GI bleed  CAD/CABG, DES Continue with statins  Hypertension Holding Cozaar  due to blood pressure being soft  CKD stage IIIa: Monitor Hypokalemia: Replete orally Soft mass upper nasal cavity B/L ; ENT consulted.   Pressure Injury 06/21/19  Buttocks Mid Stage 2 -  Partial thickness loss of dermis presenting as a shallow open injury with a red, pink wound bed without slough. (Active)  06/21/19 0700  Location: Buttocks  Location Orientation: Mid  Staging: Stage 2 -  Partial thickness loss of dermis presenting as a shallow open injury with a red, pink wound bed without slough.  Wound Description (Comments):   Present on Admission:                    Estimated body mass index is 27.23 kg/m as calculated from the following:   Height as of this encounter: 6\' 2"  (1.88 m).   Weight as of this encounter: 96.2 kg.   DVT prophylaxis: SCDs no anticoagulation due to GI bleed Code Status: Full code Family Communication: Disposition Plan:  Status is: Inpatient  Remains inpatient appropriate because:Hemodynamically unstable   Dispo: The patient is from: Home              Anticipated d/c is to: Home              Anticipated d/c date is: 2 days              Patient currently is not medically stable to d/c.        Consultants:   EP  Cardiology  ID  Procedures:   Removal of AICD  SB enteroscopy  Multiple extraction of tooth #2, 28, 31 2 quadrants of alveoloplasty.  Gross debridement of remaining dentition.  Antimicrobials:  Ceftriaxone  Subjective: Used language sign traslator.  He has not had BM yet.  He report breathing ok, denies dyspnea.   Objective: Vitals:   09/02/19 0500 09/02/19 0530 09/02/19 0600 09/02/19 0700  BP:  (!) 144/52 (!) 143/46 (!) 138/47  Pulse: 70 70 70 69  Resp: (!) 21 18 16 14   Temp:      TempSrc:      SpO2: 97% 98% 96% 96%  Weight:  96.2 kg    Height:        Intake/Output Summary (Last 24 hours) at 09/02/2019 0759 Last data filed at 09/02/2019 0029 Gross per 24 hour  Intake 996.5 ml  Output 650 ml  Net 346.5 ml   Filed Weights   08/31/19 0500 09/01/19 0500 09/02/19 0530  Weight: 97.6 kg 98 kg 96.2 kg    Examination:  General exam: NAD Respiratory system:  Bilateral air movement, decreased right  Cardiovascular system: S 1, S 2 RRR Gastrointestinal system: BS present, soft, nt Central nervous system: Alert Extremities: trace edema   Data Reviewed: I have personally reviewed following labs and imaging studies  CBC: Recent Labs  Lab 08/29/19 0337 08/30/19 0206 08/31/19 0225 09/01/19 0341 09/02/19 0550  WBC 6.6 6.5 6.7 5.6 5.3  NEUTROABS 5.1 4.8 5.5 4.1 4.1  HGB 7.7* 7.7* 8.0* 7.7* 7.6*  HCT 26.4* 26.0* 27.4* 26.5* 26.1*  MCV 86.3 84.1 86.2 87.2 86.1  PLT 207 198 245 215 102   Basic Metabolic Panel: Recent Labs  Lab 08/29/19 0337 08/30/19 0206 08/31/19 0225 09/01/19 0341 09/02/19 0550  NA 138 139 139 138 139  K 3.8 3.4* 4.6 4.0 3.6  CL 103 103 104 103 102  CO2 27 28 27 30 30   GLUCOSE 98 96 104* 85 130*  BUN 17 16 17  17  16  CREATININE 1.10 1.06 1.08 1.09 1.06  CALCIUM 7.7* 7.7* 7.7* 7.8* 7.8*   GFR: Estimated Creatinine Clearance: 62.5 mL/min (by C-G formula based on SCr of 1.06 mg/dL). Liver Function Tests: No results for input(s): AST, ALT, ALKPHOS, BILITOT, PROT, ALBUMIN in the last 168 hours. No results for input(s): LIPASE, AMYLASE in the last 168 hours. No results for input(s): AMMONIA in the last 168 hours. Coagulation Profile: No results for input(s): INR, PROTIME in the last 168 hours. Cardiac Enzymes: No results for input(s): CKTOTAL, CKMB, CKMBINDEX, TROPONINI in the last 168 hours. BNP (last 3 results) No results for input(s): PROBNP in the last 8760 hours. HbA1C: No results for input(s): HGBA1C in the last 72 hours. CBG: Recent Labs  Lab 08/30/19 0702 08/31/19 0513 09/01/19 0514 09/01/19 1806 09/02/19 0510  GLUCAP 99 106* 88 91 98   Lipid Profile: No results for input(s): CHOL, HDL, LDLCALC, TRIG, CHOLHDL, LDLDIRECT in the last 72 hours. Thyroid Function Tests: No results for input(s): TSH, T4TOTAL, FREET4, T3FREE, THYROIDAB in the last 72 hours. Anemia Panel: No results for input(s):  VITAMINB12, FOLATE, FERRITIN, TIBC, IRON, RETICCTPCT in the last 72 hours. Sepsis Labs: No results for input(s): PROCALCITON, LATICACIDVEN in the last 168 hours.  Recent Results (from the past 240 hour(s))  Surgical PCR screen     Status: None   Collection Time: 08/25/19 11:53 AM   Specimen: Nasal Mucosa; Nasal Swab  Result Value Ref Range Status   MRSA, PCR NEGATIVE NEGATIVE Final   Staphylococcus aureus NEGATIVE NEGATIVE Final    Comment: (NOTE) The Xpert SA Assay (FDA approved for NASAL specimens in patients 35 years of age and older), is one component of a comprehensive surveillance program. It is not intended to diagnose infection nor to guide or monitor treatment. Performed at [facility], [place] [address], [place], [place] [address]   Culture, blood (routine x 2)     Status: None   Collection Time: 08/26/19  6:26 AM   Specimen: BLOOD LEFT HAND  Result Value Ref Range Status   Specimen Description BLOOD LEFT HAND  Final   Special Requests   Final    BOTTLES DRAWN AEROBIC AND ANAEROBIC Blood Culture results may not be optimal due to an inadequate volume of blood received in culture bottles   Culture   Final    NO GROWTH 5 DAYS Performed at [facility], [place] [address], [place], [place] [address]    Report Status 08/31/2019 FINAL  Final  Culture, blood (routine x 2)     Status: None   Collection Time: 08/26/19  6:26 AM   Specimen: BLOOD  Result Value Ref Range Status   Specimen Description BLOOD LEFT ARM  Final   Special Requests   Final    BOTTLES DRAWN AEROBIC AND ANAEROBIC Blood Culture adequate volume   Culture   Final    NO GROWTH 5 DAYS Performed at [facility], [address], [place], [place] [address]    Report Status 08/31/2019 FINAL  Final  Surgical PCR screen     Status: None   Collection Time: 08/31/19  2:20 PM   Specimen: Nasal Mucosa; Nasal Swab  Result Value Ref Range Status   MRSA, PCR NEGATIVE NEGATIVE Final   Staphylococcus  aureus NEGATIVE NEGATIVE Final    Comment: (NOTE) The Xpert SA Assay (FDA approved for NASAL specimens in patients 33 years of age and older), is one component of a comprehensive surveillance program.  It is not intended to diagnose infection nor to guide or monitor treatment. Performed at [facility], [place] [address] [place], [place] [address]          Radiology Studies: EP PPM/ICD IMPLANT  Result Date: 09/01/2019 Conclusion: Successful insertion of a new biventricular ICD and removal of the 2 previously implanted temporary transvenous pacing leads in a patient with complete heart block, chronic atrial fibrillation, chronic systolic heart failure in the setting of ischemic cardiomyopathy ejection fraction 30%.  Of note the patient's decision to place an ICD lead was made based on his severe tricuspid regurgitation and severe difficulty placing pacing leads at the time of his temporary permanent insertion during his extraction procedure. Cristopher Peru, MD  DG Chest Port 1 View  Result Date: 09/01/2019 CLINICAL DATA:  ICD placement EXAM: PORTABLE CHEST 1 VIEW COMPARISON:  Radiograph 08/29/2019 FINDINGS: There is near complete opacification of the right mid to upper lung with few residual air lucencies which could reflect some aerated lung parenchyma or air bronchograms. Patchy opacity noted in the residually aerated portion of the right lower lobe. Large right pleural effusion is present. More small left pleural effusion with some posterior layering distribution. Postsurgical changes related to prior CABG including intact and aligned sternotomy hardware and multiple surgical clips projecting over the mediastinum. Stable cardiomegaly. The aorta is calcified. The remaining cardiomediastinal contours are unremarkable. Removal of the previously seen left chest wall pacer pack with interval placement of a 2 lead pacer/defibrillator device overlying the right chest wall with leads at the right  atrium and coronary sinus. Telemetry leads overlie the chest. IMPRESSION: 1. Removal of the previously seen left chest wall pacer device with placement of a right chest wall pacer/AICD with leads at the cardiac apex and coronary sinus. 2. Near complete opacification of the right mid to upper lung with few residual air lucencies which could reflect some aerated lung parenchyma or air bronchograms., suspect this may be part of the large right pleural effusion with atelectatic collapse though some underlying airspace process is not excluded particularly given some patchy opacity in the residually aerated portion of the right lower lobe. 3. Small left effusion as well. 4. Stable cardiomegaly and features of prior CABG. Electronically Signed   By: [name] M.D.   On: 09/01/2019 20:26        Scheduled Meds:  sodium chloride   Intravenous Once   Chlorhexidine Gluconate Cloth  6 each Topical Daily   ferrous sulfate  325 mg Oral Daily   furosemide  40 mg Intravenous BID   Gerhardt's butt cream   Topical BID   magnesium hydroxide  30 mL Oral Once   multivitamin with minerals  1 tablet Oral Daily   pantoprazole  40 mg Intravenous Q12H   polyethylene glycol  17 g Oral Daily   senna  1 tablet Oral Daily   simvastatin  20 mg Oral q1800   sodium chloride flush  3 mL Intravenous Q12H   Continuous Infusions:  sodium chloride 10 mL/hr at 08/30/19 0400   cefTRIAXone (ROCEPHIN) IVPB 2 gram/100 mL NS (Mini-Bag Plus) 2 g (09/01/19 2343)   lactated ringers 10 mL/hr at 09/01/19 1444     LOS: 9 days    Time spent: 35 minutes.     Elmarie Shiley, MD [facility]   If 7PM-7AM, please contact night-coverage [email or web address]  09/02/2019, 7:59 AM

## 2019-09-02 NOTE — Evaluation (Addendum)
Physical Therapy Evaluation Patient Details Name: Thomas [name] MRN: [id number] DOB: [birth year] Today's Date: 09/02/2019   History of Present Illness  85 yo deaf male admitted to [facility] 6/4 with weakness, sepsis due to LLL PNA with recommendation for transfer to [facility] for ICD removal due to group b strep. ICD removal on 6/11 with temp pacer placed. Multiple tooth extractions 6/16. Pacer implant 6/18. PMHx: CAD, ICD, HF, CKD, AFib, prostate CA, sepsis, CABG, ICM  Clinical Impression  Pt evaluated with in-person interpreter which is pt preference stating stratus continues to freeze during use. Pt states awareness of pacemaker restrictions yet needed cues during transfers not to push with RUE. Pt easily frustrated with cues and questions for function and pt very adamant about goal to return home with wife and son who works despite inability to get to standing this session. Pt with lack of awareness for current assist needed and lack of presence of son 2hrs. Pt with decreased mobility, function , strength, independence and awareness of precautions who will benefit from acute therapy to maximize mobility, safety and independence to decrease burden of care.   SpO2 88-95% on 3L    Follow Up Recommendations SNF;Supervision/Assistance - 24 hour    Equipment Recommendations  Other (comment) (TBD)    Recommendations for Other Services       Precautions / Restrictions Precautions Precautions: ICD/Pacemaker;Fall Restrictions Weight Bearing Restrictions: Yes RUE Weight Bearing: Non weight bearing LUE Weight Bearing: Non weight bearing      Mobility  Bed Mobility Overal bed mobility: Needs Assistance Bed Mobility: Supine to Sit;Sit to Supine     Supine to sit: Mod assist Sit to supine: Mod assist   General bed mobility comments: pt able to move legs toward EOB with mod assist to fully elevate trunk from surface with HOB 25 degrees and max assist to fully scoot to EOB with pad and reciprocal  scooting. Mod to return to bed to lift legs. Total +2 to slide toward [facility] as pt not assisting with lower body despite cues  Transfers                 General transfer comment: pt sat EOB and reports fear of falling having slid off the EOB previously and refused attempting to stand  Ambulation/Gait             General Gait Details: Pt declined attempting  Stairs            Wheelchair Mobility    Modified Rankin (Stroke Patients Only)       Balance Overall balance assessment: Needs assistance Sitting-balance support: No upper extremity supported;Feet supported Sitting balance-Leahy Scale: Fair Sitting balance - Comments: pt with posterior right lean with leg perturbation in sitting                                     Pertinent Vitals/Pain Faces Pain Scale: Hurts a little bit Pain Location: right shoulder Pain Descriptors / Indicators: Sore Pain Intervention(s): Limited activity within patient's tolerance;Monitored during session;Repositioned    Home Living Family/patient expects to be discharged to:: Private residence Living Arrangements: Spouse/significant other Available Help at Discharge: Family;Available 24 hours/day Type of Home: House Home Access: Level entry     Home Layout: One level Home Equipment: Walker - 2 wheels;Kasandra Knudsen - single point Additional Comments: Lives with wife and adult son, son works    Prior Function Level of Independence:  Independent with assistive device(s)         Comments: RW with 7 falls in last 8 months, doesn't walk long distance     Hand Dominance        Extremity/Trunk Assessment   Upper Extremity Assessment Upper Extremity Assessment: Generalized weakness    Lower Extremity Assessment Lower Extremity Assessment: Generalized weakness    Cervical / Trunk Assessment Cervical / Trunk Assessment: Normal  Communication   Communication: Deaf;Interpreter utilized (In person interpreter  Richard present throughout session)  Cognition Arousal/Alertness: Awake/alert Behavior During Therapy: WFL for tasks assessed/performed Overall Cognitive Status: Impaired/Different from baseline Area of Impairment: Safety/judgement                         Safety/Judgement: Decreased awareness of safety;Decreased awareness of deficits     General Comments: pt fearful of standing and would not attempt. Despite need for 2 person assist to return to bed and inability to stand pt states he will return home and have rehab at home. Pt states his son can lift him even though he works. Easily frustrated with questions and cues for movement      General Comments      Exercises General Exercises - Lower Extremity Long Arc Quad: AROM;Both;Seated;10 reps Hip Flexion/Marching: AROM;Both;Seated;10 reps   Assessment/Plan    PT Assessment Patient needs continued PT services  PT Problem List Decreased strength;Decreased activity tolerance;Decreased balance;Decreased mobility;Decreased safety awareness;Decreased cognition;Decreased knowledge of use of DME       PT Treatment Interventions Gait training;DME instruction;Therapeutic exercise;Balance training;Functional mobility training;Therapeutic activities;Patient/family education;Cognitive remediation;Neuromuscular re-education    PT Goals (Current goals can be found in the Care Plan section)  Acute Rehab PT Goals Patient Stated Goal: to go home PT Goal Formulation: With patient Time For Goal Achievement: 09/15/19 Potential to Achieve Goals: Fair    Frequency Min 3X/week   Barriers to discharge Decreased caregiver support      Co-evaluation               AM-PAC PT "6 Clicks" Mobility  Outcome Measure Help needed turning from your back to your side while in a flat bed without using bedrails?: A Lot Help needed moving from lying on your back to sitting on the side of a flat bed without using bedrails?: A Lot Help needed  moving to and from a bed to a chair (including a wheelchair)?: Total Help needed standing up from a chair using your arms (e.g., wheelchair or bedside chair)?: Total Help needed to walk in hospital room?: Total Help needed climbing 3-5 steps with a railing? : Total 6 Click Score: 8    End of Session   Activity Tolerance: Patient limited by fatigue Patient left: in bed;with call bell/phone within reach;with bed alarm set Nurse Communication: Mobility status;Precautions PT Visit Diagnosis: History of falling (Z91.81);Difficulty in walking, not elsewhere classified (R26.2);Muscle weakness (generalized) (M62.81);Unsteadiness on feet (R26.81)    Time: 1275-1700 PT Time Calculation (min) (ACUTE ONLY): 32 min   Charges:   PT Evaluation $PT Eval Moderate Complexity: 1 Mod PT Treatments $Therapeutic Activity: 8-22 mins        Meleane Selinger P, PT Acute Rehabilitation Services Pager: [phone] Office: [phone]   Eulah Walkup B Daviel Allegretto 09/02/2019, 1:43 PM

## 2019-09-02 NOTE — Progress Notes (Signed)
Progress Note  Patient Name: Thomas [name] Date of Encounter: 09/02/2019  [facility] Cardiologist: Sanda Klein, MD  EP: Dr. [name]  Subjective   Resting comfortably in bed.  Status post right-sided CRT yesterday.  Inpatient Medications    Scheduled Meds: . sodium chloride   Intravenous Once  . Chlorhexidine Gluconate Cloth  6 each Topical Daily  . ferrous sulfate  325 mg Oral Daily  . furosemide  40 mg Intravenous BID  . Gerhardt's butt cream   Topical BID  . magnesium hydroxide  30 mL Oral Once  . multivitamin with minerals  1 tablet Oral Daily  . pantoprazole  40 mg Intravenous Q12H  . polyethylene glycol  17 g Oral Daily  . senna  1 tablet Oral Daily  . simvastatin  20 mg Oral q1800  . sodium chloride flush  3 mL Intravenous Q12H   Continuous Infusions: . sodium chloride 10 mL/hr at 08/30/19 0400  . cefTRIAXone (ROCEPHIN) IVPB 2 gram/100 mL NS (Mini-Bag Plus) 2 g (09/01/19 2343)  . lactated ringers 10 mL/hr at 09/01/19 1444   PRN Meds: sodium chloride, acetaminophen, acetaminophen, albuterol, dextromethorphan-guaiFENesin, HYDROcodone-acetaminophen, ondansetron (ZOFRAN) IV, sodium chloride flush   Vital Signs    Vitals:   09/02/19 0500 09/02/19 0530 09/02/19 0600 09/02/19 0700  BP:  (!) 144/52 (!) 143/46 (!) 138/47  Pulse: 70 70 70 69  Resp: (!) 21 18 16 14   Temp:      TempSrc:      SpO2: 97% 98% 96% 96%  Weight:  96.2 kg    Height:        Intake/Output Summary (Last 24 hours) at 09/02/2019 0733 Last data filed at 09/02/2019 0029 Gross per 24 hour  Intake 996.5 ml  Output 650 ml  Net 346.5 ml   Last 3 Weights 09/02/2019 09/01/2019 08/31/2019  Weight (lbs) 212 lb 1.3 oz 216 lb 0.8 oz 215 lb 2.7 oz  Weight (kg) 96.2 kg 98 kg 97.6 kg      Telemetry    Atrial fibrillation, V paced with occasional PVCs-personally reviewed  ECG    No new EKGs   Physical Exam   GEN: Well nourished, well developed, in no acute distress  HEENT: normal  Neck: no  JVD, carotid bruits, or masses Cardiac: RRR; no murmurs, rubs, or gallops,no edema  Respiratory:  clear to auscultation bilaterally, normal work of breathing GI: soft, nontender, nondistended, + BS MS: no deformity or atrophy  Skin: warm and dry, device site well healed Neuro:  Strength and sensation are intact Psych: euthymic mood, full affect    Labs    High Sensitivity Troponin:  No results for input(s): TROPONINIHS in the last 720 hours.    Chemistry Recent Labs  Lab 08/31/19 0225 09/01/19 0341 09/02/19 0550  NA 139 138 139  K 4.6 4.0 3.6  CL 104 103 102  CO2 27 30 30   GLUCOSE 104* 85 130*  BUN 17 17 16   CREATININE 1.08 1.09 1.06  CALCIUM 7.7* 7.8* 7.8*  GFRNONAA >60 >60 >60  GFRAA >60 >60 >60  ANIONGAP 8 5 7      Hematology Recent Labs  Lab 08/31/19 0225 09/01/19 0341 09/02/19 0550  WBC 6.7 5.6 5.3  RBC 3.18* 3.04* 3.03*  HGB 8.0* 7.7* 7.6*  HCT 27.4* 26.5* 26.1*  MCV 86.2 87.2 86.1  MCH 25.2* 25.3* 25.1*  MCHC 29.2* 29.1* 29.1*  RDW 20.8* 21.1* 20.9*  PLT 245 215 206    BNP No results for  input(s): BNP, PROBNP in the last 168 hours.   DDimer No results for input(s): DDIMER in the last 168 hours.   Radiology    08/28/2019: CT, maxillofacial  IMPRESSION: 1. Numerous caries bilaterally. Right upper third molar caries with periapical lucency. 2. Soft tissue mass in the upper nasal cavity bilaterally. Recommend direct visualization and biopsy if appropriate   DG CHEST PORT 1 VIEW Result Date: 08/28/2019 CLINICAL DATA:  Chest pain EXAM: PORTABLE CHEST 1 VIEW COMPARISON:  August 26, 2019 FINDINGS: There is cardiomegaly. There is mild pulmonary venous hypertension. Pacemaker leads attached to right atrium and coronary sinus,, stable. There are persistent bilateral pleural effusions with airspace opacity in the left lower lobe, stable. No new opacity evident. There is no evident adenopathy. There is aortic atherosclerosis. No bone lesions. IMPRESSION:  Cardiomegaly with a degree of pulmonary vascular congestion. Persistent pleural effusions bilaterally with left lower lobe airspace opacity concerning for atelectasis and potential pneumonia in the left lower lung region. No new opacity evident. Pacemaker leads unchanged in position. Status post coronary artery bypass grafting. Aortic Atherosclerosis (ICD10-I70.0). Electronically Signed   By: Lowella Grip III M.D.   On: 08/28/2019 08:11    VAS [place] UPPER EXTREMITY VENOUS DUPLEX Result Date: 08/26/2019 UPPER VENOUS STUDY  Indications: Swelling Performing Technologist: June Leap RDMS, RVT  Examination Guidelines: A complete evaluation includes B-mode imaging, spectral Doppler, color Doppler, and power Doppler as needed of all accessible portions of each vessel. Bilateral testing is considered an integral part of a complete examination. Limited examinations for reoccurring indications may be performed as noted.  Summary:  Left: No evidence of deep vein thrombosis in the upper extremity. Findings consistent with acute superficial vein thrombosis involving the left cephalic vein.  *See table(s) above for measurements and observations.  Diagnosing physician: Monica Martinez MD Electronically signed by Monica Martinez MD on 08/26/2019 at 12:49:19 PM.    Final       Cardiac Studies   08/23/2019: TEE IMPRESSIONS  1. Left ventricular ejection fraction, by estimation, is 55 to 60%. The  left ventricle has normal function. The left ventricle has no regional  wall motion abnormalities.   2. Right ventricular systolic function is normal. The right ventricular  size is normal.   3. Left atrial size was mild to moderately dilated. No left atrial/left  atrial appendage thrombus was detected.   4. Right atrial size was mildly dilated.   5. The mitral valve is normal in structure. Mild mitral valve  regurgitation.   6. Tricuspid valve regurgitation is moderate to severe.   7. The aortic valve is  tricuspid. Aortic valve regurgitation is mild.   Conclusion(s)/Recommendation(s): No evidence of vegetation/infective  endocarditis on this transesophageal  echocardiogram.   Patient Profile     85 y.o. male w/PMHx of congenital deafness, CAD s/p 1986 CABG as below, 2000 inferior STEMI with subsequent VT/ 2nd degree AVB /CHB, dual chamber PPM implantation 04/25/1998 due to CHB with change-out 2007, 2014 Medtronic CRT-D due to EF 30% despite GDMT, multiple catheterizations / PCIs, PCI/BMS placed SVG-OM 2015, known CTO of the SVG-RCA, HFrEF with recovery of EF by most recent  (55-60%, 08/2019), lymphedema, AAA and aortic root dilation, remote strokes as seen on CT imaging, permanent Afib not on [place] due to GIB, s/p transfusion / known AVMs / falls, anemia, CKDIII, prostate CA, HTN, HLD.  He has h/o recurrent group B streptococcus with recent 06/2019 admission due to sepsis with blood cultures positive for strep B sepsis. He  was initially placed on broad-spectrum antibiotics, which were then consolidated to penicillin. TEE 06/2019 was performed and without evidence of vegetation on pacer wires or cardiac valves. He was IV diuresed and sent home on prolonged abx for 6-8 weeks.  Of note, ultrasound was performed of his left upper extremity due to LUE>RUE and without evidence of DVT.   He was readmitted to [facility] after near syncope 08/18/2019 and found to have recurrent group B streptococcus bacteremia. He noted a strange feeling of "coldness" in his chest, dizziness, and intermittent SOB with his near fall. During admission, he reported melena. Subsequent 08/22/2019 enteroscopy was without evidence of active GIB.  Recovery of EF noted in 08/2019 echo. TEE  08/23/2019 was without evidence of vegetation on device or cardiac valves.  Infectious disease recommendation was for extraction of device, given recurrent bacteremia despite prolonged course of antibiotics.  Case was thus discussed with Dr. [name] of [facility] given  multiple comorbid conditions and likely high risk extraction. EP reviewed the case with recommendation to transfer to [facility] secondary to class I indication for extraction of device  Assessment & Plan    1. Sepsis recurrent Group B streptococcus bacteremia Unknown source but very poor dentition.  Status post dental extractions.  Remains afebrile without leukocytosis.  Is now status post ICD explant.  Continue with ceftriaxone per ID recommendations.  2. CHB CRT-D extraction 08/25/2019.  Had reimplantation 09/01/2019.  Chest x-ray has been ordered but not yet performed.  We [facility] Thomas [name] arrange for follow-up in device clinic once he leaves the hospital.   3. Left base PNA Currently afebrile.  Internal medicine managing.     4. Acute on chronic diastolic HF Normal ejection fraction though remains edematous.  We Thomas [name] continue home medications and diuresis with torsemide.  Creatinine has remained stable.     5. LUE edema Negative for DVT.  Has superficial vein thrombosis.  Conservative management per internal medicine.    6. Acute on chronic blood loss anemia with iron def Is now at his baseline.  Negative capsule endoscopy.  Has received IV iron.  Plan per IM.    7. Permanent Afib No anticoagulation due to GI bleed.   8. CAD/ CABG, DES/ HLD No current ischemic symptoms.   9. HTN Stable   10. CKD stage 3a Creatinine stable  We Thomas [name] have him work with PT to explain arm limitations.  Thomas [name] discuss again with him restrictions once he is more awake and alert.  Plan for device reimplantation today and removal of temporary devices.   For questions or updates, please contact [place] Please consult [email or web address] for contact info under    Signed, Thomas Koike Meredith Leeds, MD  09/02/2019, 7:33 AM

## 2019-09-03 ENCOUNTER — Inpatient Hospital Stay (HOSPITAL_COMMUNITY): Payer: PPO

## 2019-09-03 LAB — BASIC METABOLIC PANEL
Anion gap: 6 (ref 5–15)
Anion gap: 6 (ref 5–15)
BUN: 12 mg/dL (ref 8–23)
BUN: 12 mg/dL (ref 8–23)
CO2: 32 mmol/L (ref 22–32)
CO2: 31 mmol/L (ref 22–32)
Calcium: 7.7 mg/dL — ABNORMAL LOW (ref 8.9–10.3)
Calcium: 7.7 mg/dL — ABNORMAL LOW (ref 8.9–10.3)
Chloride: 101 mmol/L (ref 98–111)
Chloride: 102 mmol/L (ref 98–111)
Creatinine, Ser: 0.86 mg/dL (ref 0.61–1.24)
Creatinine, Ser: 0.88 mg/dL (ref 0.61–1.24)
GFR calc Af Amer: >60 mL/min (ref >60)
GFR calc Af Amer: >60 mL/min (ref >60)
GFR calc non Af Amer: >60 mL/min (ref >60)
GFR calc non Af Amer: >60 mL/min (ref >60)
Glucose, Bld: 93 mg/dL (ref 70–99)
Glucose, Bld: 118 mg/dL — ABNORMAL HIGH (ref 70–99)
Potassium: 3.3 mmol/L — ABNORMAL LOW (ref 3.5–5.1)
Potassium: 3.6 mmol/L (ref 3.5–5.1)
Sodium: 139 mmol/L (ref 135–145)
Sodium: 139 mmol/L (ref 135–145)

## 2019-09-03 LAB — CBC WITH DIFFERENTIAL/PLATELET
Abs Immature Granulocytes: 0.02 10*3/uL (ref 0.00–0.07)
Basophils Absolute: 0 10*3/uL (ref 0.0–0.1)
Basophils Relative: 1 %
Eosinophils Absolute: 0.1 10*3/uL (ref 0.0–0.5)
Eosinophils Relative: 2 %
HCT: 25.3 % — ABNORMAL LOW (ref 39.0–52.0)
Hemoglobin: 7.5 g/dL — ABNORMAL LOW (ref 13.0–17.0)
Immature Granulocytes: 0 %
Lymphocytes Relative: 13 %
Lymphs Abs: 0.7 10*3/uL (ref 0.7–4.0)
MCH: 26 pg (ref 26.0–34.0)
MCHC: 29.6 g/dL — ABNORMAL LOW (ref 30.0–36.0)
MCV: 87.8 fL (ref 80.0–100.0)
Monocytes Absolute: 0.5 10*3/uL (ref 0.1–1.0)
Monocytes Relative: 10 %
Neutro Abs: 4.1 10*3/uL (ref 1.7–7.7)
Neutrophils Relative %: 74 %
Platelets: 199 10*3/uL (ref 150–400)
RBC: 2.88 MIL/uL — ABNORMAL LOW (ref 4.22–5.81)
RDW: 21.3 % — ABNORMAL HIGH (ref 11.5–15.5)
WBC: 5.4 10*3/uL (ref 4.0–10.5)
nRBC: 0 % (ref 0.0–0.2)

## 2019-09-03 LAB — VITAMIN B12: Vitamin B-12: 1478 pg/mL — ABNORMAL HIGH (ref 180–914)

## 2019-09-03 LAB — GLUCOSE, CAPILLARY: Glucose-Capillary: 86 mg/dL (ref 70–99)

## 2019-09-03 MED ORDER — PANTOPRAZOLE SODIUM 40 MG PO TBEC
40.0000 mg | DELAYED_RELEASE_TABLET | Freq: Two times a day (BID) | ORAL | Status: DC
  Administered 2019-09-03 – 2019-09-12 (×18): 40 mg via ORAL
  Filled 2019-09-03 (×18): qty 1

## 2019-09-03 MED ORDER — TORSEMIDE 20 MG PO TABS
40.0000 mg | ORAL_TABLET | Freq: Every day | ORAL | Status: DC
  Administered 2019-09-04: 40 mg via ORAL
  Filled 2019-09-03: qty 2

## 2019-09-03 MED ORDER — POTASSIUM CHLORIDE CRYS ER 20 MEQ PO TBCR
40.0000 meq | EXTENDED_RELEASE_TABLET | Freq: Once | ORAL | Status: AC
  Administered 2019-09-03: 40 meq via ORAL
  Filled 2019-09-03: qty 2

## 2019-09-03 NOTE — Plan of Care (Signed)
  Problem: Education: Goal: Knowledge of General Education information will improve Description: Including pain rating scale, medication(s)/side effects and non-pharmacologic comfort measures Outcome: Progressing   Problem: Health Behavior/Discharge Planning: Goal: Ability to manage health-related needs will improve Outcome: Progressing   Problem: Clinical Measurements: Goal: Ability to maintain clinical measurements within normal limits will improve Outcome: Progressing Goal: Will remain free from infection Outcome: Progressing Goal: Diagnostic test results will improve Outcome: Progressing Goal: Respiratory complications will improve Outcome: Progressing Goal: Cardiovascular complication will be avoided Outcome: Progressing   Problem: Activity: Goal: Risk for activity intolerance will decrease Outcome: Progressing   Problem: Nutrition: Goal: Adequate nutrition will be maintained Outcome: Progressing   Problem: Coping: Goal: Level of anxiety will decrease Outcome: Progressing   Problem: Elimination: Goal: Will not experience complications related to bowel motility Outcome: Progressing Goal: Will not experience complications related to urinary retention Outcome: Progressing   Problem: Pain Managment: Goal: General experience of comfort will improve Outcome: Progressing   Problem: Safety: Goal: Ability to remain free from injury will improve Outcome: Progressing   Problem: Skin Integrity: Goal: Risk for impaired skin integrity will decrease Outcome: Progressing   Problem: Cardiovascular: Goal: Ability to achieve and maintain adequate cardiovascular perfusion will improve Outcome: Progressing Goal: Vascular access site(s) Level 0-1 will be maintained Outcome: Progressing   Problem: Education: Goal: Knowledge of cardiac device and self-care will improve Outcome: Progressing Goal: Ability to safely manage health related needs after discharge will  improve Outcome: Progressing Goal: Individualized Educational Video(s) Outcome: Progressing   Problem: Cardiac: Goal: Ability to achieve and maintain adequate cardiopulmonary perfusion will improve Outcome: Progressing

## 2019-09-03 NOTE — Evaluation (Signed)
Occupational Therapy Evaluation Patient Details Name: Thomas [name] MRN: [id number] DOB: 02-03-1935 Today's Date: 09/03/2019    History of Present Illness 85 yo deaf male admitted to [facility] 6/4 with weakness, sepsis due to LLL PNA with recommendation for transfer to [facility] for ICD removal due to group b strep. ICD removal on 6/11 with temp pacer placed. Multiple tooth extractions 6/16. Pacer implant 6/18. PMHx: CAD, ICD, HF, CKD, AFib, prostate CA, sepsis, CABG, ICM   Clinical Impression   PTA, pt was living with his wife and adult son and reports his wife assisted him with ADLs and he uses a RW/rollator at home for mobility. Pt currently requiring Min A for UB ADLs, Max A for LB ADLs, and Min Guard-Min A for functional mobility with RW. Pt presenting with decreased balance, strength, awareness, and activity tolerance. Pt performing functional mobility to door and back to bed. Pt fatigues quickly. Pt would benefit from further acute OT to facilitate safe dc. Pending pt progress, recommend dc to SNF for further OT to optimize safety, independence with ADLs, and return to PLOF.     Follow Up Recommendations  SNF;Supervision/Assistance - 24 hour    Equipment Recommendations  None recommended by OT    Recommendations for Other Services PT consult     Precautions / Restrictions Precautions Precautions: ICD/Pacemaker;Fall      Mobility Bed Mobility Overal bed mobility: Needs Assistance Bed Mobility: Supine to Sit;Sit to Supine     Supine to sit: Min assist;HOB elevated Sit to supine: Min assist   General bed mobility comments: Min A to hold therapist's hand and pull into upright position. Min A for bringing LLE over EOB in returning to bed  Transfers Overall transfer level: Needs assistance Equipment used: Rolling walker (2 wheeled) Transfers: Sit to/from Stand Sit to Stand: Min assist;From elevated surface         General transfer comment: Min A to power up into standing from  elevated surface.    Balance Overall balance assessment: Needs assistance Sitting-balance support: No upper extremity supported;Feet supported Sitting balance-Leahy Scale: Fair     Standing balance support: Bilateral upper extremity supported;During functional activity Standing balance-Leahy Scale: Poor Standing balance comment: Reliant on UE support                           ADL either performed or assessed with clinical judgement   ADL Overall ADL's : Needs assistance/impaired Eating/Feeding: Supervision/ safety;Set up;Bed level Eating/Feeding Details (indicate cue type and reason): Set up for lunch with HOB elevated Grooming: Wash/dry face;Set up;Sitting   Upper Body Bathing: Minimal assistance;Sitting   Lower Body Bathing: Maximal assistance;Sit to/from stand   Upper Body Dressing : Minimal assistance;Sitting   Lower Body Dressing: Maximal assistance;Bed level Lower Body Dressing Details (indicate cue type and reason): Donned socks Toilet Transfer: RW;Ambulation;Minimal assistance (simulated in room) Toilet Transfer Details (indicate cue type and reason): Min A to power up into standing Toileting- Clothing Manipulation and Hygiene: Maximal assistance;Sit to/from stand Toileting - Clothing Manipulation Details (indicate cue type and reason): Pt with bowel movement upon standing. When cueing pt to wait as he had a BM, pt stating "no i didn't". Max A for peri care.     Functional mobility during ADLs: Min guard;Rolling walker General ADL Comments: Pt presenting with decreased strength and activity tolerance     Vision Baseline Vision/History: Wears glasses Patient Visual Report: No change from baseline  Perception     Praxis      Pertinent Vitals/Pain Pain Assessment: Faces Faces Pain Scale: Hurts little more Pain Location: Bilateral knees and handstrings Pain Descriptors / Indicators: Sore Pain Intervention(s): Monitored during session      Hand Dominance Right   Extremity/Trunk Assessment Upper Extremity Assessment Upper Extremity Assessment: Generalized weakness   Lower Extremity Assessment Lower Extremity Assessment: Defer to PT evaluation   Cervical / Trunk Assessment Cervical / Trunk Assessment: Normal   Communication Communication Communication: Deaf (ASL used throughout session)   Cognition Arousal/Alertness: Awake/alert Behavior During Therapy: WFL for tasks assessed/performed Overall Cognitive Status: Impaired/Different from baseline Area of Impairment: Safety/judgement;Problem solving                         Safety/Judgement: Decreased awareness of safety;Decreased awareness of deficits   Problem Solving: Slow processing;Requires tactile cues General Comments: Pt presenting with decreased awareness of deficits. Requiring increased encouragment for OOB activity. Also noting decreased problem solving when managing food items and opening containers.   General Comments  HR stable. SpO2 in 90s on RA. During mobility, SpO2 reading at 77 however, poor pleth line and pt denies SOB.     Exercises     Shoulder Instructions      Home Living Family/patient expects to be discharged to:: Private residence Living Arrangements: Spouse/significant other Available Help at Discharge: Family;Available 24 hours/day Type of Home: House Home Access: Level entry     Home Layout: One level     Bathroom Shower/Tub: Teacher, early years/pre: Standard     Home Equipment: Environmental consultant - 2 wheels;Cane - single point   Additional Comments: Lives with wife and adult son, son works      Prior Functioning/Environment Level of Independence: Independent with assistive device(s)        Comments: RW with 7 falls in last 8 months, doesn't walk long distance        OT Problem List: Decreased strength;Decreased activity tolerance;Impaired balance (sitting and/or standing);Decreased knowledge of use  of DME or AE;Cardiopulmonary status limiting activity      OT Treatment/Interventions: Self-care/ADL training;Therapeutic exercise;Energy conservation;DME and/or AE instruction;Therapeutic activities;Balance training;Patient/family education    OT Goals(Current goals can be found in the care plan section) Acute Rehab OT Goals Patient Stated Goal: to go home OT Goal Formulation: With patient Time For Goal Achievement: 09/02/19 Potential to Achieve Goals: Good  OT Frequency: Min 2X/week   Barriers to D/C:            Co-evaluation              AM-PAC OT "6 Clicks" Daily Activity     Outcome Measure Help from another person eating meals?: A Little Help from another person taking care of personal grooming?: A Little Help from another person toileting, which includes using toliet, bedpan, or urinal?: A Lot Help from another person bathing (including washing, rinsing, drying)?: A Lot Help from another person to put on and taking off regular upper body clothing?: A Little Help from another person to put on and taking off regular lower body clothing?: A Lot 6 Click Score: 15   End of Session Equipment Utilized During Treatment: Rolling walker Nurse Communication: Mobility status  Activity Tolerance: Patient tolerated treatment well Patient left: in bed;with call bell/phone within reach;with nursing/sitter in room  OT Visit Diagnosis: Repeated falls (R29.6);Muscle weakness (generalized) (M62.81)  Time: 6503-5465 OT Time Calculation (min): 40 min Charges:  OT General Charges $OT Visit: 1 Visit OT Evaluation $OT Eval Moderate Complexity: 1 Mod OT Treatments $Self Care/Home Management : 23-37 mins  Camiyah Friberg MSOT, OTR/L Acute Rehab Pager: [phone] Office: [place] 09/03/2019, 1:45 PM

## 2019-09-03 NOTE — Progress Notes (Signed)
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 At [facility] patient was followed by both Dr. Tama High with ID and Dr. [name] with cardiology.  TEE was performed on 6/9 with no evidence of vegetation.  Despite this finding, infectious disease recommended transfer to [facility] for removal of ICD considering recurrent group B strep bacteremia.  During patient hospital course, patient also was found to be anemic status post 2 unit of packed red blood cells.  Dr. [name] with GI was consulted who performed a small bowel enteroscopy revealing no evidence of significant pathology in the duodenum, jejunum, esophagus or stomach.  On 08/25/2019 patient underwent extraction of the biventricular ICD system along with [place] right ventricular pacing lead and removal of the biventricular ICD with insertion of a new temp perm transvenous pacemaker.  Dr. Dorothyann Gibbs dentist was consulted for dental caries extraction.  Patient underwent procedure multiple extraction of tooth #2, 28, 31.  2 quadrants of alveoloplasty.  On 6/16.     Assessment & Plan:   Principal Problem:   Bacteremia due to group B Streptococcus Active Problems:   Cardiomyopathy, ischemic   Permanent atrial fibrillation (HCC)    Benign essential HTN   Mixed hyperlipidemia   Iron deficiency anemia due to chronic blood loss   Coronary artery disease of bypass graft of native heart with stable angina pectoris (HCC)   Acute renal failure superimposed on stage 3a chronic kidney disease (HCC)   Sepsis due to group B Streptococcus (HCC)   Duodenal arteriovenous malformation   Candidal intertrigo   Edema of left upper extremity   Pneumonia of left lower lobe due to group B Streptococcus (HCC)  1-Septic shock likely secondary to group B streptococcus bacteremia Unknown source, but patient with very poor dentition. PNA EEG on 6/9 no evidence of vegetation Blood cultures on 08/26/2019 left after pacemaker removal no growth to date. Patient underwent extraction of the pacemaker on 6/11, he currently has an temporary pacer. On IV Rocephin.  He needs IV antibiotics through 70/11/2019 Exchange of Pacemaker/defibrilator 6/18 Stable, afebrile.   2-Bilateral pneumonia: Chest x ray Right upper and middle lobe complete opacification.  Flutter valve ordered.  Continue with IV ceftriaxone Nebulizer Pulmonary consulted. Recommend pulmonary toilet.  Saline nebulizer. Vest  Aeration on x ray improved.   3-Acute on chronic systolic heart failure: Recent TEE show ejection fraction 55% EP/cardiology on board.  On IV lasix. Change to oral torsemide.  Plan is to start torsemide, Cozaar and Aldactone once blood pressure stabilized Renal function stable.   Left upper extremity edema: Ultrasound negative for DVT 06/2019.  Repeat showed acute superficial vein thrombosis  Acute on chronic blood loss anemia with iron deficiency: Status post capsule endoscopy without any source of bleeding Status post IV iron, 2 unit of packed red blood cell Hb stable   Permanent  A. fib: Rate control No anticoagulation due to GI bleed  CAD/CABG, DES Continue with statins  Hypertension Holding Cozaar due to blood pressure being soft  CKD stage  IIIa: Monitor Hypokalemia: Replete orally Soft mass upper nasal cavity B/L ; ENT consulted. Discussed with Dr Kalman Shan, follow up out patient. Might be nasal polyps.   Pressure Injury 06/21/19 Buttocks Mid Stage 2 -  Partial thickness loss of dermis presenting as a shallow open injury with a red, pink wound bed without slough. (Active)  06/21/19 0700  Location: Buttocks  Location Orientation: Mid  Staging: Stage 2 -  Partial thickness loss of dermis presenting as a shallow open injury with a red, pink wound bed without slough.  Wound Description (Comments):   Present on Admission:                    Estimated body mass index is 26.89 kg/m as calculated from the following:   Height as of this encounter: 6\' 2"  (1.88 m).   Weight as of this encounter: 95 kg.   DVT prophylaxis: SCDs no anticoagulation due to GI bleed Code Status: Full code Family Communication: Disposition Plan:  Status is: Inpatient  Remains inpatient appropriate because:Hemodynamically unstable   Dispo: The patient is from: Home              Anticipated d/c is to: Home              Anticipated d/c date is: 2 days              Patient currently is medically stable to d/c. snf         Consultants:   EP  Cardiology  ID  Procedures:   Removal of AICD  SB enteroscopy  Multiple extraction of tooth #2, 28, 31 2 quadrants of alveoloplasty.  Gross debridement of remaining dentition.  Antimicrobials:  Ceftriaxone  Subjective: Used language sign traslator.  No new complaints. Had BM yesterday.   Objective: Vitals:   09/02/19 2043 09/02/19 2318 09/03/19 0357 09/03/19 0755  BP:  (!) 121/44 (!) 114/44   Pulse:  70 70   Resp:  17 19   Temp:  98 F (36.7 C) 97.6 F (36.4 C)   TempSrc:  Oral Oral   SpO2: 100% 98% 98% 99%  Weight:   95 kg   Height:        Intake/Output Summary (Last 24 hours) at 09/03/2019 0805 Last data filed at 09/03/2019 0640 Gross per 24 hour  Intake --  Output  1750 ml  Net -1750 ml   Filed Weights   09/01/19 0500 09/02/19 0530 09/03/19 0357  Weight: 98 kg 96.2 kg 95 kg    Examination:  General exam: NAD Respiratory system: B/L crackles Cardiovascular system: S 1, S 2 RRR Gastrointestinal system: BS present, soft, nt Central nervous system: Non focal Extremities: trace edema   Data Reviewed: I have personally reviewed following labs and imaging studies  CBC: Recent Labs  Lab 08/30/19 0206 08/31/19 0225 09/01/19 0341 09/02/19 0550 09/03/19 0400  WBC 6.5 6.7 5.6 5.3 5.4  NEUTROABS 4.8 5.5 4.1 4.1 4.1  HGB 7.7* 8.0* 7.7* 7.6* 7.5*  HCT 26.0* 27.4* 26.5* 26.1* 25.3*  MCV 84.1 86.2 87.2 86.1 87.8  PLT 198 245 215 206 400   Basic Metabolic Panel: Recent Labs  Lab 08/30/19 0206 08/31/19 0225 09/01/19 0341 09/02/19 0550 09/03/19 0400  NA 139 139 138 139 139  K 3.4* 4.6 4.0 3.6 3.3*  CL 103 104 103 102 101  CO2 28 27 30 30  32  GLUCOSE 96 104* 85 130* 93  BUN 16 17 17 16 12   CREATININE 1.06 1.08 1.09 1.06 0.86  CALCIUM 7.7* 7.7* 7.8* 7.8* 7.7*   GFR: Estimated Creatinine Clearance: 77 mL/min (by C-G formula based on SCr of 0.86 mg/dL). Liver Function Tests: No results for input(s): AST, ALT, ALKPHOS, BILITOT, PROT, ALBUMIN in the last 168 hours. No results for input(s): LIPASE, AMYLASE in the last 168 hours. No results for input(s): AMMONIA in the last 168 hours. Coagulation Profile: No results for input(s): INR, PROTIME in the last 168 hours. Cardiac Enzymes: No results for input(s): CKTOTAL, CKMB, CKMBINDEX, TROPONINI in the last 168 hours. BNP (last 3 results) No results for input(s): PROBNP in the last 8760 hours. HbA1C: No results for input(s): HGBA1C in the last 72 hours. CBG: Recent Labs  Lab 09/01/19 0514 09/01/19 1806 09/02/19 0510 09/02/19 1628 09/03/19 0534  GLUCAP 88 91 98 98 86   Lipid Profile: No results for input(s): CHOL, HDL, LDLCALC, TRIG, CHOLHDL, LDLDIRECT in the last 72 hours. Thyroid  Function Tests: No results for input(s): TSH, T4TOTAL, FREET4, T3FREE, THYROIDAB in the last 72 hours. Anemia Panel: Recent Labs    09/03/19 0400  VITAMINB12 1,478*   Sepsis Labs: No results for input(s): PROCALCITON, LATICACIDVEN in the last 168 hours.  Recent Results (from the past 240 hour(s))  Surgical PCR screen     Status: None   Collection Time: 08/25/19 11:53 AM   Specimen: Nasal Mucosa; Nasal Swab  Result Value Ref Range Status   MRSA, PCR NEGATIVE NEGATIVE Final   Staphylococcus aureus NEGATIVE NEGATIVE Final    Comment: (NOTE) The Xpert SA Assay (FDA approved for NASAL specimens in patients 82 years of age and older), is one component of a comprehensive surveillance program. It is not intended to diagnose infection nor to guide or monitor treatment. Performed at [facility], [place] [address], [place], [place] [address]   Culture, blood (routine x 2)     Status: None   Collection Time: 08/26/19  6:26 AM   Specimen: BLOOD LEFT HAND  Result Value Ref Range Status   Specimen Description BLOOD LEFT HAND  Final   Special Requests   Final    BOTTLES DRAWN AEROBIC AND ANAEROBIC Blood Culture results may not be optimal due to an inadequate volume of blood received in culture bottles   Culture   Final    NO GROWTH 5 DAYS Performed at [facility], [place] [address], [place], [place] [address]    Report Status 08/31/2019 FINAL  Final  Culture, blood (routine x 2)     Status: None   Collection Time: 08/26/19  6:26 AM   Specimen: BLOOD  Result Value Ref Range Status   Specimen Description BLOOD LEFT ARM  Final   Special Requests   Final    BOTTLES DRAWN AEROBIC AND ANAEROBIC Blood Culture adequate volume   Culture   Final    NO GROWTH 5 DAYS Performed at [facility], [address], [place], [place] [address]    Report Status 08/31/2019 FINAL  Final  Surgical PCR screen     Status: None   Collection Time: 08/31/19  2:20 PM   Specimen: Nasal Mucosa;  Nasal Swab  Result Value Ref Range Status   MRSA, PCR NEGATIVE NEGATIVE Final   Staphylococcus aureus NEGATIVE NEGATIVE Final    Comment: (NOTE) The Xpert  SA Assay (FDA approved for NASAL specimens in patients 68 years of age and older), is one component of a comprehensive surveillance program. It is not intended to diagnose infection nor to guide or monitor treatment. Performed at [facility], [place] [address] [place], [place] [address]          Radiology Studies: DG Chest 2 View  Result Date: 09/02/2019 CLINICAL DATA:  Implantable cardioverter-defibrillator placement. EXAM: CHEST - 2 VIEW COMPARISON:  Chest radiograph dated 09/01/2019 FINDINGS: A right subclavian approach cardiac device is redemonstrated. Sternotomy hardware and surgical clips overlying the mediastinum and heart are redemonstrated. A left upper extremity peripherally inserted central venous catheter tip overlies the superior vena cava. The heart remains enlarged. Vascular calcifications are seen in the aortic arch. There is increased aeration of the right upper lung. Small bilateral pleural effusions with associated atelectasis/airspace disease appear similar to prior exam. There is no pneumothorax. The visualized skeletal structures are unremarkable. IMPRESSION: Increased aeration of the right upper lung. Unchanged small bilateral pleural effusions with associated atelectasis/airspace disease. Electronically Signed   By: Zerita Boers M.D.   On: 09/02/2019 11:37   EP PPM/ICD IMPLANT  Result Date: 09/01/2019 Conclusion: Successful insertion of a new biventricular ICD and removal of the 2 previously implanted temporary transvenous pacing leads in a patient with complete heart block, chronic atrial fibrillation, chronic systolic heart failure in the setting of ischemic cardiomyopathy ejection fraction 30%.  Of note the patient's decision to place an ICD lead was made based on his severe tricuspid regurgitation and  severe difficulty placing pacing leads at the time of his temporary permanent insertion during his extraction procedure. Cristopher Peru, MD  DG Chest Port 1 View  Result Date: 09/01/2019 CLINICAL DATA:  ICD placement EXAM: PORTABLE CHEST 1 VIEW COMPARISON:  Radiograph 08/29/2019 FINDINGS: There is near complete opacification of the right mid to upper lung with few residual air lucencies which could reflect some aerated lung parenchyma or air bronchograms. Patchy opacity noted in the residually aerated portion of the right lower lobe. Large right pleural effusion is present. More small left pleural effusion with some posterior layering distribution. Postsurgical changes related to prior CABG including intact and aligned sternotomy hardware and multiple surgical clips projecting over the mediastinum. Stable cardiomegaly. The aorta is calcified. The remaining cardiomediastinal contours are unremarkable. Removal of the previously seen left chest wall pacer pack with interval placement of a 2 lead pacer/defibrillator device overlying the right chest wall with leads at the right atrium and coronary sinus. Telemetry leads overlie the chest. IMPRESSION: 1. Removal of the previously seen left chest wall pacer device with placement of a right chest wall pacer/AICD with leads at the cardiac apex and coronary sinus. 2. Near complete opacification of the right mid to upper lung with few residual air lucencies which could reflect some aerated lung parenchyma or air bronchograms., suspect this may be part of the large right pleural effusion with atelectatic collapse though some underlying airspace process is not excluded particularly given some patchy opacity in the residually aerated portion of the right lower lobe. 3. Small left effusion as well. 4. Stable cardiomegaly and features of prior CABG. Electronically Signed   By: [name] M.D.   On: 09/01/2019 20:26        Scheduled Meds: . sodium chloride   Intravenous  Once  . bisacodyl  10 mg Rectal Once  . Chlorhexidine Gluconate Cloth  6 each Topical Daily  . ferrous sulfate  325  mg Oral Daily  . furosemide  40 mg Intravenous BID  . Gerhardt's butt cream   Topical BID  . heparin injection (subcutaneous)  5,000 Units Subcutaneous Q8H  . magnesium hydroxide  30 mL Oral Once  . multivitamin with minerals  1 tablet Oral Daily  . pantoprazole  40 mg Intravenous Q12H  . polyethylene glycol  17 g Oral Daily  . senna  1 tablet Oral Daily  . simvastatin  20 mg Oral q1800  . sodium chloride flush  3 mL Intravenous Q12H  . sodium chloride HYPERTONIC  4 mL Nebulization TID   Continuous Infusions: . sodium chloride 10 mL/hr at 08/30/19 0400  . cefTRIAXone (ROCEPHIN) IVPB 2 gram/100 mL NS (Mini-Bag Plus) 2 g (09/03/19 0005)  . lactated ringers 10 mL/hr at 09/01/19 1444     LOS: 10 days    Time spent: 35 minutes.     Elmarie Shiley, MD [facility]   If 7PM-7AM, please contact night-coverage [email or web address]  09/03/2019, 8:05 AM

## 2019-09-04 ENCOUNTER — Encounter (HOSPITAL_COMMUNITY): Payer: Self-pay | Admitting: Internal Medicine

## 2019-09-04 ENCOUNTER — Inpatient Hospital Stay (HOSPITAL_COMMUNITY): Payer: PPO

## 2019-09-04 DIAGNOSIS — J9601 Acute respiratory failure with hypoxia: Secondary | ICD-10-CM

## 2019-09-04 DIAGNOSIS — J90 Pleural effusion, not elsewhere classified: Secondary | ICD-10-CM

## 2019-09-04 LAB — BASIC METABOLIC PANEL
Anion gap: 6 (ref 5–15)
BUN: 12 mg/dL (ref 8–23)
CO2: 32 mmol/L (ref 22–32)
Calcium: 7.7 mg/dL — ABNORMAL LOW (ref 8.9–10.3)
Chloride: 103 mmol/L (ref 98–111)
Creatinine, Ser: 0.86 mg/dL (ref 0.61–1.24)
GFR calc Af Amer: >60 mL/min (ref >60)
GFR calc non Af Amer: >60 mL/min (ref >60)
Glucose, Bld: 98 mg/dL (ref 70–99)
Potassium: 3.7 mmol/L (ref 3.5–5.1)
Sodium: 141 mmol/L (ref 135–145)

## 2019-09-04 LAB — CBC WITH DIFFERENTIAL/PLATELET
Abs Immature Granulocytes: 0.03 10*3/uL (ref 0.00–0.07)
Basophils Absolute: 0.1 10*3/uL (ref 0.0–0.1)
Basophils Relative: 1 %
Eosinophils Absolute: 0.1 10*3/uL (ref 0.0–0.5)
Eosinophils Relative: 2 %
HCT: 25.8 % — ABNORMAL LOW (ref 39.0–52.0)
Hemoglobin: 7.5 g/dL — ABNORMAL LOW (ref 13.0–17.0)
Immature Granulocytes: 1 %
Lymphocytes Relative: 11 %
Lymphs Abs: 0.6 10*3/uL — ABNORMAL LOW (ref 0.7–4.0)
MCH: 25.4 pg — ABNORMAL LOW (ref 26.0–34.0)
MCHC: 29.1 g/dL — ABNORMAL LOW (ref 30.0–36.0)
MCV: 87.5 fL (ref 80.0–100.0)
Monocytes Absolute: 0.5 10*3/uL (ref 0.1–1.0)
Monocytes Relative: 10 %
Neutro Abs: 3.9 10*3/uL (ref 1.7–7.7)
Neutrophils Relative %: 75 %
Platelets: 188 10*3/uL (ref 150–400)
RBC: 2.95 MIL/uL — ABNORMAL LOW (ref 4.22–5.81)
RDW: 21.3 % — ABNORMAL HIGH (ref 11.5–15.5)
WBC: 5.2 10*3/uL (ref 4.0–10.5)
nRBC: 0 % (ref 0.0–0.2)

## 2019-09-04 LAB — GLUCOSE, CAPILLARY: Glucose-Capillary: 92 mg/dL (ref 70–99)

## 2019-09-04 MED ORDER — FUROSEMIDE 10 MG/ML IJ SOLN
40.0000 mg | Freq: Two times a day (BID) | INTRAMUSCULAR | Status: DC
  Administered 2019-09-04 – 2019-09-07 (×7): 40 mg via INTRAVENOUS
  Filled 2019-09-04 (×7): qty 4

## 2019-09-04 MED FILL — Cefazolin Sodium-Dextrose IV Solution 2 GM/100ML-4%: INTRAVENOUS | Qty: 100 | Status: AC

## 2019-09-04 MED FILL — Lidocaine HCl Local Inj 1%: INTRAMUSCULAR | Qty: 60 | Status: AC

## 2019-09-04 NOTE — Progress Notes (Signed)
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 At [facility] patient was followed by both Dr. Tama High with ID and Dr. [name] with cardiology.  TEE was performed on 6/9 with no evidence of vegetation.  Despite this finding, infectious disease recommended transfer to [facility] for removal of ICD considering recurrent group B strep bacteremia.  During patient hospital course, patient also was found to be anemic status post 2 unit of packed red blood cells.  Dr. [name] with GI was consulted who performed a small bowel enteroscopy revealing no evidence of significant pathology in the duodenum, jejunum, esophagus or stomach.  On 08/25/2019 patient underwent extraction of the biventricular ICD system along with [place] right ventricular pacing lead and removal of the biventricular ICD with insertion of a new temp perm transvenous pacemaker.  Dr. Dorothyann Gibbs dentist was consulted for dental caries extraction.  Patient underwent procedure multiple extraction of tooth #2, 28, 31.  2 quadrants of alveoloplasty.  On 6/16.     Assessment & Plan:   Principal Problem:   Bacteremia due to group B Streptococcus Active Problems:   Cardiomyopathy, ischemic   Permanent atrial fibrillation (HCC)    Benign essential HTN   Mixed hyperlipidemia   Iron deficiency anemia due to chronic blood loss   Coronary artery disease of bypass graft of native heart with stable angina pectoris (HCC)   Acute renal failure superimposed on stage 3a chronic kidney disease (HCC)   Sepsis due to group B Streptococcus (HCC)   Duodenal arteriovenous malformation   Candidal intertrigo   Edema of left upper extremity   Pneumonia of left lower lobe due to group B Streptococcus (HCC)  1-Septic shock likely secondary to group B streptococcus bacteremia Unknown source, but patient with very poor dentition. PNA EEG on 6/9 no evidence of vegetation Blood cultures on 08/26/2019 left after pacemaker removal no growth to date. Patient underwent extraction of the pacemaker on 6/11, he currently has an temporary pacer. On IV Rocephin.  He needs IV antibiotics through 70/11/2019 Exchange of Pacemaker/defibrilator 6/18 Stable, afebrile.   2-Bilateral pneumonia: Chest x ray Right upper and middle lobe complete opacification.  Flutter valve ordered.  Continue with IV ceftriaxone Nebulizer Pulmonary consulted. Recommend pulmonary toilet.  Saline nebulizer. Vest  Chest x ray today ; diffuse interstitial thickening. Change torsemide to IV lasix.   3-Acute on chronic systolic heart failure: Recent TEE show ejection fraction 55% EP/cardiology on board.  Plan is to start torsemide, Cozaar and Aldactone once blood pressure stabilized Renal function stable.  Resume IV lasix today, chest  Xray with worsening pulmonary edema.   Left upper extremity edema: Ultrasound negative for DVT 06/2019.  Repeat showed acute superficial vein thrombosis  Acute on chronic blood loss anemia with iron deficiency: Status post capsule endoscopy without any source of bleeding Status post IV iron,  2 unit of packed red blood cell Hb stable  Continue with oral iron.   Permanent A. fib: Rate control No anticoagulation due to GI  bleed  CAD/CABG, DES Continue with statins  Hypertension Holding Cozaar due to blood pressure being soft  CKD stage IIIa: Monitor Hypokalemia: Replete orally Soft mass upper nasal cavity B/L ; ENT consulted. Discussed with Dr Constance Holster, follow up out patient. Might be nasal polyps.   Pressure Injury 06/21/19 Buttocks Mid Stage 2 -  Partial thickness loss of dermis presenting as a shallow open injury with a red, pink wound bed without slough. (Active)  06/21/19 0700  Location: Buttocks  Location Orientation: Mid  Staging: Stage 2 -  Partial thickness loss of dermis presenting as a shallow open injury with a red, pink wound bed without slough.  Wound Description (Comments):   Present on Admission:                    Estimated body mass index is 26.78 kg/m as calculated from the following:   Height as of this encounter: 6\' 2"  (1.88 m).   Weight as of this encounter: 94.6 kg.   DVT prophylaxis: SCDs no anticoagulation due to GI bleed Code Status: Full code Family Communication: Disposition Plan:  Status is: Inpatient  Remains inpatient appropriate because:Hemodynamically unstable   Dispo: The patient is from: Home              Anticipated d/c is to: Home              Anticipated d/c date is: 2 days              Patient currently is medically stable to d/c. snf Back on IV lasix for pulmonary edema        Consultants:   EP  Cardiology  ID  Procedures:   Removal of AICD  SB enteroscopy  Multiple extraction of tooth #2, 28, 31 2 quadrants of alveoloplasty.  Gross debridement of remaining dentition.  Antimicrobials:  Ceftriaxone  Subjective: No new complaints. Dyspnea is not worse. Cough not significant   Objective: Vitals:   09/04/19 0500 09/04/19 0742 09/04/19 0842 09/04/19 1310  BP:  (!) 110/39  133/70  Pulse:  70 71 (!) 50  Resp:  13 17 (!) 22  Temp:  98.8 F (37.1 C)  98.1 F (36.7 C)  TempSrc:  Axillary  Oral  SpO2:  100% 100%    Weight: 94.6 kg     Height:        Intake/Output Summary (Last 24 hours) at 09/04/2019 1314 Last data filed at 09/04/2019 0600 Gross per 24 hour  Intake 80 ml  Output 800 ml  Net -720 ml   Filed Weights   09/02/19 0530 09/03/19 0357 09/04/19 0500  Weight: 96.2 kg 95 kg 94.6 kg    Examination:  General exam: NAD Respiratory system: B/L crackles.  Cardiovascular system; S 1 S  2 RRR Gastrointestinal system: BS present, soft, nt Central nervous system: Non focal.  Extremities: Trace edema   Data Reviewed: I have personally reviewed following labs and imaging studies  CBC: Recent Labs  Lab 08/31/19 0225 09/01/19 0341 09/02/19 0550 09/03/19 0400 09/04/19 0403  WBC 6.7 5.6 5.3 5.4 5.2  NEUTROABS 5.5 4.1 4.1 4.1 3.9  HGB 8.0* 7.7* 7.6* 7.5* 7.5*  HCT 27.4* 26.5* 26.1* 25.3* 25.8*  MCV 86.2 87.2 86.1 87.8 87.5  PLT 245 215 206 199 962   Basic Metabolic Panel: Recent Labs  Lab 09/01/19 0341 09/02/19 0550 09/03/19 0400 09/03/19 1430 09/04/19 0403  NA 138 139 139 139 141  K 4.0 3.6 3.3* 3.6 3.7  CL 103 102 101 102 103  CO2 30 30 32 31 32  GLUCOSE 85 130* 93 118* 98  BUN 17 16 12 12 12   CREATININE 1.09 1.06 0.86 0.88 0.86  CALCIUM 7.8* 7.8* 7.7* 7.7* 7.7*   GFR: Estimated Creatinine Clearance: 77 mL/min (by C-G formula based on SCr of 0.86 mg/dL). Liver Function Tests: No results for input(s): AST, ALT, ALKPHOS, BILITOT, PROT, ALBUMIN in the last 168 hours. No results for input(s): LIPASE, AMYLASE in the last 168 hours. No results for input(s): AMMONIA in the last 168 hours. Coagulation Profile: No results for input(s): INR, PROTIME in the last 168 hours. Cardiac Enzymes: No results for input(s): CKTOTAL, CKMB, CKMBINDEX, TROPONINI in the last 168 hours. BNP (last 3 results) No results for input(s): PROBNP in the last 8760 hours. HbA1C: No results for input(s): HGBA1C in the last 72 hours. CBG: Recent Labs  Lab 09/01/19 1806 09/02/19 0510  09/02/19 1628 09/03/19 0534 09/04/19 0631  GLUCAP 91 98 98 86 92   Lipid Profile: No results for input(s): CHOL, HDL, LDLCALC, TRIG, CHOLHDL, LDLDIRECT in the last 72 hours. Thyroid Function Tests: No results for input(s): TSH, T4TOTAL, FREET4, T3FREE, THYROIDAB in the last 72 hours. Anemia Panel: Recent Labs    09/03/19 0400  VITAMINB12 1,478*   Sepsis Labs: No results for input(s): PROCALCITON, LATICACIDVEN in the last 168 hours.  Recent Results (from the past 240 hour(s))  Culture, blood (routine x 2)     Status: None   Collection Time: 08/26/19  6:26 AM   Specimen: BLOOD LEFT HAND  Result Value Ref Range Status   Specimen Description BLOOD LEFT HAND  Final   Special Requests   Final    BOTTLES DRAWN AEROBIC AND ANAEROBIC Blood Culture results may not be optimal due to an inadequate volume of blood received in culture bottles   Culture   Final    NO GROWTH 5 DAYS Performed at [facility], [place] [address], [place], [place] [address]    Report Status 08/31/2019 FINAL  Final  Culture, blood (routine x 2)     Status: None   Collection Time: 08/26/19  6:26 AM   Specimen: BLOOD  Result Value Ref Range Status   Specimen Description BLOOD LEFT ARM  Final   Special Requests   Final    BOTTLES DRAWN AEROBIC AND ANAEROBIC Blood Culture adequate volume   Culture   Final    NO GROWTH 5 DAYS Performed at [facility], [address], [place], [place] [address]    Report Status 08/31/2019 FINAL  Final  Surgical PCR screen     Status: None   Collection Time: 08/31/19  2:20 PM   Specimen: Nasal Mucosa; Nasal Swab  Result Value Ref Range Status   MRSA, PCR NEGATIVE NEGATIVE Final   Staphylococcus aureus NEGATIVE NEGATIVE Final    Comment: (NOTE) The Xpert SA Assay (FDA approved for NASAL specimens in patients 44 years of age and older), is one component of a comprehensive surveillance program. It is not intended to diagnose infection nor to guide or monitor  treatment. Performed at [facility], [place] [address] [place],  [address]          Radiology Studies: DG Chest 2 View  Result Date: 09/04/2019 CLINICAL DATA:  Pneumonia EXAM: CHEST -  2 VIEW COMPARISON:  09/02/2017 FINDINGS: Small bilateral pleural effusions. Bilateral diffuse interstitial thickening. Bibasilar hazy airspace disease. No pneumothorax. Stable cardiomegaly. Prior CABG. Cardiac pacemaker is noted. Thoracic aortic atherosclerosis. Severe osteoarthritis of the right glenohumeral joint. IMPRESSION: Cardiomegaly with bilateral pleural effusions and diffuse interstitial thickening. Differential considerations include pulmonary edema with bibasilar atelectasis versus pneumonia. Electronically Signed   By: Kathreen Devoid   On: 09/04/2019 11:15   DG Chest Port 1 View  Result Date: 09/03/2019 CLINICAL DATA:  Acute respiratory distress EXAM: PORTABLE CHEST 1 VIEW COMPARISON:  September 02, 2019 FINDINGS: The left PICC line is in good position. Stable AICD device. Stable cardiomegaly. The hila and mediastinum are unchanged. No pneumothorax. Bilateral pleural effusions with underlying opacities. Pulmonary edema. Increasing opacity in the right mid lung. IMPRESSION: 1. Increasing opacity in the right mid lung could represent atelectasis underlying layering effusion versus developing infiltrate. Recommend clinical correlation and attention on follow-up. 2. Layering pleural effusions with underlying atelectasis. 3. Cardiomegaly and mild edema. Electronically Signed   By: Dorise Bullion III M.D   On: 09/03/2019 11:38        Scheduled Meds: . Chlorhexidine Gluconate Cloth  6 each Topical Daily  . ferrous sulfate  325 mg Oral Daily  . furosemide  40 mg Intravenous Q12H  . Gerhardt's butt cream   Topical BID  . heparin injection (subcutaneous)  5,000 Units Subcutaneous Q8H  . multivitamin with minerals  1 tablet Oral Daily  . pantoprazole  40 mg Oral BID  . polyethylene glycol  17 g  Oral Daily  . senna  1 tablet Oral Daily  . simvastatin  20 mg Oral q1800  . sodium chloride flush  3 mL Intravenous Q12H  . sodium chloride HYPERTONIC  4 mL Nebulization TID   Continuous Infusions: . sodium chloride 10 mL/hr at 08/30/19 0400  . cefTRIAXone (ROCEPHIN) IVPB 2 gram/100 mL NS (Mini-Bag Plus) 2 g (09/04/19 0053)  . lactated ringers 10 mL/hr at 09/03/19 1700     LOS: 11 days    Time spent: 35 minutes.     Elmarie Shiley, MD [facility]   If 7PM-7AM, please contact night-coverage [email or web address]  09/04/2019, 1:14 PM

## 2019-09-04 NOTE — Progress Notes (Signed)
   EP will see as needed while remains here. Follow up placed in AVS.   Please call with questions.    Legrand Como [address] [place], PA-C  09/04/2019 11:36 AM

## 2019-09-04 NOTE — Progress Notes (Signed)
Name: Thomas [name] MRN: [id number] DOB: May 02, 1934    ADMISSION DATE:  08/24/2019 CONSULTATION DATE:  09/04/2019  REFERRING MD :  Tyrell Antonio, [facility]  CHIEF COMPLAINT: Abnormal x-ray  BRIEF PATIENT DESCRIPTION: 85 year old deaf man admitted with recurrent group B strep bacteremia necessitating removal of ICD/pacemaker.  PCCM consulted for atelectasis  HISTORY OF PRESENT ILLNESS: 85 year old this man admitted 06/2019 with group B strep bacteremia, treated with 6 weeks of antibiotics.  Readmitted 6/4 to [facility] and found to have left lower lobe pneumonia and recurrent group B strep bacteremia.  TEE 6/9 did not show any evidence of vegetation.  He underwent extraction of ICD/pacer system and transvenous pacer was placed.  He also underwent dental extraction. He was maintained on IV Rocephin .  Oxygen requirements have varied from 2 to 3 L.  We are consulted for abnormal chest x-ray. Chest x-ray 6/18 showed opacification of the right upper lung with large right effusion, small left effusion also noted.  Trachea appears to be shifted to the right Chest x-ray from 6/15 showed bibasilar consolidation and effusions with cardiomegaly  PMH - CAD, high-grade AV block status post Medtronic dual-chamber biventricular ICD, chronic systolic heart failure ejection fraction as low as 30%, recent ejection fraction 55% on TEE on 08/23/2019, stage III CKD, chronic A. 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 6/16 dental extraction  STUDIES:  CXR 6/20 > Increasing opacity in the right mid lung could represent atelectasis underlying layering effusion versus developing infiltrate. Recommend clinical correlation and attention  on follow-up. Layering pleural effusions with underlying atelectasis. Cardiomegaly and mild edema.   PAST MEDICAL HISTORY :   has a past medical history of AICD (automatic cardioverter/defibrillator) present, Bacteremia due to group B Streptococcus, Biventricular ICD (implantable cardioverter-defibrillator) in place (03/24/2005), CHF (congestive heart failure) ([place]), CKD (chronic kidney disease), stage III, Coronary artery disease, Deaf, Dysrhythmia, History of abdominal aortic aneurysm, History of bleeding peptic ulcer (1980), History of epididymitis (2013), HTN (hypertension), Hydronephrosis with ureteropelvic junction obstruction, Hydroureter on left (2009), Hypertension, Ischemic cardiomyopathy, Moderate tricuspid regurgitation, PAF (paroxysmal atrial fibrillation) ([place]), Presence of permanent cardiac pacemaker (2002), Prostate cancer [facility]), Status post coronary artery bypass grafting (1986), and Testicular swelling.  has a past surgical history that includes Coronary artery bypass graft (1986); Insert / replace / remove pacemaker; Transurethral resection of prostate; Cardiac catheterization (12/10/2011); 2-D echocardiogram (11/20/2011); Persantine Myoview (05/06/2010); left heart catheterization with coronary/graft angiogram (N/A, 12/10/2011); bi-ventricular implantable cardioverter defibrillator (N/A, 12/16/2012); left heart catheterization with coronary/graft angiogram (N/A, 09/25/2013); Cataract extraction w/PHACO (Right, 10/12/2017); Esophagogastroduodenoscopy (egd) with propofol (N/A, 05/27/2018); Esophagogastroduodenoscopy (N/A, 07/13/2018); Colonoscopy (N/A, 07/13/2018); Esophagogastroduodenoscopy (N/A, 09/14/2018); enteroscopy (N/A, 09/14/2018); Esophagogastroduodenoscopy (N/A, 06/21/2019); TEE without cardioversion (N/A, 06/22/2019); enteroscopy (N/A, 08/22/2019); TEE without cardioversion (N/A, 08/23/2019); Icd lead removal (Left, 08/25/2019); TEE without cardioversion (N/A, 08/25/2019); Multiple extractions with  alveoloplasty (Bilateral, 08/30/2019); and BIV ICD INSERTION CRT-D (N/A, 09/01/2019). Prior to Admission medications   Medication Sig Start Date End Date Taking? Authorizing Provider  acetaminophen (TYLENOL) 325 MG tablet Take 2 tablets (650 mg total) by mouth every 6 (six) hours as needed for mild pain or fever. 08/23/19   Loletha Grayer, MD  albuterol (PROVENTIL) (2.5 MG/3ML) 0.083% nebulizer solution Inhale 3 mLs  into the lungs every 4 (four) hours as needed for wheezing or shortness of breath. 08/23/19   Wieting, Richard, MD  cefTRIAXone 2 g in sodium chloride 0.9 % 100 mL Inject 2 g into the vein daily. 08/24/19   Loletha Grayer, MD  dextromethorphan-guaiFENesin [facility] DM) 30-600 MG 12hr tablet Take 1 tablet by mouth 2 (two) times daily as needed for cough. 08/23/19   Loletha Grayer, MD  ferrous sulfate 325 (65 FE) MG tablet Take 1 tablet (325 mg total) by mouth daily for 30 days. 05/28/18   Elodia Florence., MD  ipratropium-albuterol (DUONEB) 0.5-2.5 (3) MG/3ML SOLN Take 3 mLs by nebulization every 6 (six) hours. 08/23/19   Loletha Grayer, MD  losartan (COZAAR) 25 MG tablet Take 0.5 tablets (12.5 mg total) by mouth daily. 06/26/19   Danford, Suann Larry, MD  Multiple Vitamin (MULTIVITAMIN WITH MINERALS) TABS tablet Take 1 tablet by mouth daily. 05/29/19   Nicole Kindred A, DO  ondansetron (ZOFRAN) 4 MG/2ML SOLN injection Inject 2 mLs (4 mg total) into the vein every 8 (eight) hours as needed for nausea or vomiting. 08/23/19   Loletha Grayer, MD  pantoprazole (PROTONIX) 40 MG injection Inject 40 mg into the vein every 12 (twelve) hours. 08/23/19   Loletha Grayer, MD  simvastatin (ZOCOR) 20 MG tablet TAKE 1 TABLET BY MOUTH EVERY DAY Patient taking differently: Take 20 mg by mouth daily.  04/07/19   Erlene Quan, PA-C  spironolactone (ALDACTONE) 25 MG tablet Take 1 tablet (25 mg total) by mouth daily. 08/01/19 10/30/19  Alisa Graff, FNP  torsemide (DEMADEX) 20 MG tablet Take 2 tablets (40  mg total) by mouth daily. 08/23/19   Loletha Grayer, MD   Allergies  Allergen Reactions   Delene Loll [Sacubitril-Valsartan] Swelling    And bruising of arm   Phenazopyridine Nausea Only and Other (See Comments)    GI UPSET   Ramipril Other (See Comments)    unk Other reaction(s): Other (See Comments), Unknown unk    FAMILY HISTORY:  family history includes Hypertension in his father. SOCIAL HISTORY:  reports that he quit smoking about 34 years ago. He has never used smokeless tobacco. He reports that he does not drink alcohol and does not use drugs.  SUBJECTIVE:   VITAL SIGNS: Temp:  [98.1 F (36.7 C)-98.8 F (37.1 C)] 98.8 F (37.1 C) (06/21 0742) Pulse Rate:  [68-94] 71 (06/21 0842) Resp:  [13-20] 17 (06/21 0842) BP: (110-125)/(39-45) 110/39 (06/21 0742) SpO2:  [94 %-100 %] 100 % (06/21 0842) Weight:  [94.6 kg] 94.6 kg (06/21 0500)  PHYSICAL EXAMINATION: General: Elderly man, no distress, sitting in chair on room air  Neuro: Awake, alert, follows commands  HEENT: No JVD, no pallor or icterus Cardiovascular: Paced Lungs: Decreased breath sounds to bases, rhonchi upper Abdomen: Soft and nontender abdomen Musculoskeletal: +2 edema to BLE and BUE Skin: No rash  Recent Labs  Lab 09/03/19 0400 09/03/19 1430 09/04/19 0403  NA 139 139 141  K 3.3* 3.6 3.7  CL 101 102 103  CO2 32 31 32  BUN 12 12 12   CREATININE 0.86 0.88 0.86  GLUCOSE 93 118* 98   Recent Labs  Lab 09/02/19 0550 09/03/19 0400 09/04/19 0403  HGB 7.6* 7.5* 7.5*  HCT 26.1* 25.3* 25.8*  WBC 5.3 5.4 5.2  PLT 206 199 188   DG Chest 2 View  Result Date: 09/04/2019 CLINICAL DATA:  Pneumonia EXAM: CHEST - 2 VIEW COMPARISON:  09/02/2017 FINDINGS: Small bilateral pleural effusions.  Bilateral diffuse interstitial thickening. Bibasilar hazy airspace disease. No pneumothorax. Stable cardiomegaly. Prior CABG. Cardiac pacemaker is noted. Thoracic aortic atherosclerosis. Severe osteoarthritis of the right  glenohumeral joint. IMPRESSION: Cardiomegaly with bilateral pleural effusions and diffuse interstitial thickening. Differential considerations include pulmonary edema with bibasilar atelectasis versus pneumonia. Electronically Signed   By: Kathreen Devoid   On: 09/04/2019 11:15   DG Chest Port 1 View  Result Date: 09/03/2019 CLINICAL DATA:  Acute respiratory distress EXAM: PORTABLE CHEST 1 VIEW COMPARISON:  September 02, 2019 FINDINGS: The left PICC line is in good position. Stable AICD device. Stable cardiomegaly. The hila and mediastinum are unchanged. No pneumothorax. Bilateral pleural effusions with underlying opacities. Pulmonary edema. Increasing opacity in the right mid lung. IMPRESSION: 1. Increasing opacity in the right mid lung could represent atelectasis underlying layering effusion versus developing infiltrate. Recommend clinical correlation and attention on follow-up. 2. Layering pleural effusions with underlying atelectasis. 3. Cardiomegaly and mild edema. Electronically Signed   By: Dorise Bullion III M.D   On: 09/03/2019 11:38    ASSESSMENT / PLAN:  Acute hypoxic respiratory failure with right upper lobe atelectasis/consolidation and small bilateral pleural effusion -CXR 6/21 with Cardiomegaly, bilateral pleural effusions and diffuse interstitial thickening >> resolution of right upper lobe consolidation that was noted on 6/18 Plan -Needs tracheobronchial toilet > Started on Hypertonic Saline on 6/19 with plans for 3 days.   -Chest PT with flutter valve and via bed > Unable to tolerate Chest PT due to pain at pacer site  -Early ambulation with PT when transvenous pacemaker is out -Currently on room air, has been on 3-4L [place] for last 24 hours. No distress noted.   -Plans to continue to diuresis as below. Started back on IV lasix today. Appears very volume overloaded.     Group B strep bacteremia -ceftriaxone is planned through 04/18/3610 Chronic diastolic heart failure - Continuing to  diuresis. Net negative 2.1 L for stay    Hayden Pedro, AGACNP-BC [place] Pgr: [phone]

## 2019-09-04 NOTE — Plan of Care (Signed)
  Problem: Education: Goal: Knowledge of General Education information will improve Description: Including pain rating scale, medication(s)/side effects and non-pharmacologic comfort measures Outcome: Progressing   Problem: Health Behavior/Discharge Planning: Goal: Ability to manage health-related needs will improve Outcome: Progressing   Problem: Clinical Measurements: Goal: Ability to maintain clinical measurements within normal limits will improve Outcome: Progressing Goal: Will remain free from infection Outcome: Progressing Goal: Diagnostic test results will improve Outcome: Progressing Goal: Respiratory complications will improve Outcome: Progressing Goal: Cardiovascular complication will be avoided Outcome: Progressing   Problem: Activity: Goal: Risk for activity intolerance will decrease Outcome: Progressing   Problem: Nutrition: Goal: Adequate nutrition will be maintained Outcome: Progressing   Problem: Coping: Goal: Level of anxiety will decrease Outcome: Progressing   Problem: Elimination: Goal: Will not experience complications related to bowel motility Outcome: Progressing Goal: Will not experience complications related to urinary retention Outcome: Progressing   Problem: Pain Managment: Goal: General experience of comfort will improve Outcome: Progressing   Problem: Safety: Goal: Ability to remain free from injury will improve Outcome: Progressing   Problem: Skin Integrity: Goal: Risk for impaired skin integrity will decrease Outcome: Progressing   Problem: Cardiovascular: Goal: Ability to achieve and maintain adequate cardiovascular perfusion will improve Outcome: Progressing Goal: Vascular access site(s) Level 0-1 will be maintained Outcome: Progressing   Problem: Education: Goal: Knowledge of cardiac device and self-care will improve Outcome: Progressing Goal: Ability to safely manage health related needs after discharge will  improve Outcome: Progressing Goal: Individualized Educational Video(s) Outcome: Progressing   Problem: Cardiac: Goal: Ability to achieve and maintain adequate cardiopulmonary perfusion will improve Outcome: Progressing

## 2019-09-04 NOTE — Progress Notes (Signed)
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 ICD removal on 6/11 with temp pacer placed. Multiple tooth extractions 6/16. Pacer implant 6/18. PMHx: CAD, ICD, HF, CKD, AFib, prostate CA, sepsis, CABG, ICM    PT Comments    Pt making steady progress with mobility. Pt agreeable to ST-SNF. In person sign language interpreter used.    Follow Up Recommendations  SNF;Supervision/Assistance - 24 hour     Equipment Recommendations  Other (comment) (rollator)    Recommendations for Other Services       Precautions / Restrictions Precautions Precautions: ICD/Pacemaker;Fall Restrictions Weight Bearing Restrictions: Yes    Mobility  Bed Mobility Overal bed mobility: Needs Assistance Bed Mobility: Supine to Sit     Supine to sit: Min assist;HOB elevated     General bed mobility comments: Assist to elevate trunk into sitting  Transfers Overall transfer level: Needs assistance Equipment used: 4-wheeled walker Transfers: Sit to/from Stand Sit to Stand: Min assist;From elevated surface         General transfer comment: Assist to bring hips up and for balance  Ambulation/Gait Ambulation/Gait assistance: Min assist Gait Distance (Feet): 75 Feet Assistive device: 4-wheeled walker Gait Pattern/deviations: Step-through pattern;Decreased stride length;Trunk flexed Gait velocity: decr Gait velocity interpretation: 1.31 - 2.62 ft/sec, indicative of limited community ambulator General Gait Details: Assist for balance and support and cues to stand more erect   Stairs             Wheelchair Mobility    Modified Rankin (Stroke Patients Only)       Balance Overall balance assessment: Needs assistance Sitting-balance  support: No upper extremity supported;Feet supported Sitting balance-Leahy Scale: Fair     Standing balance support: Bilateral upper extremity supported;During functional activity Standing balance-Leahy Scale: Poor Standing balance comment: rollator and min guard for static standing                            Cognition Arousal/Alertness: Awake/alert Behavior During Therapy: WFL for tasks assessed/performed Overall Cognitive Status: Impaired/Different from baseline Area of Impairment: Safety/judgement;Problem solving                         Safety/Judgement: Decreased awareness of safety   Problem Solving: Requires tactile cues General Comments: cues for pacer precautions      Exercises      General Comments        Pertinent Vitals/Pain      Home Living                      Prior Function            PT Goals (current goals can now be found in the care plan section) Acute Rehab PT Goals Patient Stated Goal: to go home PT Goal Formulation: With patient Time For Goal Achievement: 09/15/19 Potential to Achieve Goals: Good Progress towards PT goals: Goals met and updated - see care plan;Progressing toward goals    Frequency    Min 3X/week      PT Plan Current plan remains appropriate    Co-evaluation              AM-PAC PT "  6 Clicks" Mobility   Outcome Measure  Help needed turning from your back to your side while in a flat bed without using bedrails?: A Little Help needed moving from lying on your back to sitting on the side of a flat bed without using bedrails?: A Little Help needed moving to and from a bed to a chair (including a wheelchair)?: A Little Help needed standing up from a chair using your arms (e.g., wheelchair or bedside chair)?: A Little Help needed to walk in hospital room?: A Little Help needed climbing 3-5 steps with a railing? : A Lot 6 Click Score: 17    End of Session Equipment Utilized During  Treatment: Gait belt Activity Tolerance: Patient tolerated treatment well Patient left: in chair;with call bell/phone within reach;with nursing/sitter in room Nurse Communication: Mobility status PT Visit Diagnosis: History of falling (Z91.81);Difficulty in walking, not elsewhere classified (R26.2);Muscle weakness (generalized) (M62.81);Unsteadiness on feet (R26.81)     Time: 1019-1050 PT Time Calculation (min) (ACUTE ONLY): 31 min  Charges:  $Gait Training: 23-37 mins                     [place] Pager [phone] Office [place] 09/04/2019, 11:34 AM

## 2019-09-05 ENCOUNTER — Telehealth: Payer: Self-pay | Admitting: Internal Medicine

## 2019-09-05 DIAGNOSIS — J810 Acute pulmonary edema: Secondary | ICD-10-CM

## 2019-09-05 LAB — BASIC METABOLIC PANEL
Anion gap: 8 (ref 5–15)
BUN: 12 mg/dL (ref 8–23)
CO2: 31 mmol/L (ref 22–32)
Calcium: 8 mg/dL — ABNORMAL LOW (ref 8.9–10.3)
Chloride: 102 mmol/L (ref 98–111)
Creatinine, Ser: 1.01 mg/dL (ref 0.61–1.24)
GFR calc Af Amer: >60 mL/min (ref >60)
GFR calc non Af Amer: >60 mL/min (ref >60)
Glucose, Bld: 125 mg/dL — ABNORMAL HIGH (ref 70–99)
Potassium: 3.3 mmol/L — ABNORMAL LOW (ref 3.5–5.1)
Sodium: 141 mmol/L (ref 135–145)

## 2019-09-05 LAB — BRAIN NATRIURETIC PEPTIDE: B Natriuretic Peptide: 403.5 pg/mL — ABNORMAL HIGH (ref 0.0–100.0)

## 2019-09-05 LAB — GLUCOSE, CAPILLARY: Glucose-Capillary: 99 mg/dL (ref 70–99)

## 2019-09-05 MED ORDER — POTASSIUM CHLORIDE ER 10 MEQ PO TBCR
40.0000 meq | EXTENDED_RELEASE_TABLET | Freq: Once | ORAL | Status: AC
  Administered 2019-09-05: 40 meq via ORAL
  Filled 2019-09-05: qty 4

## 2019-09-05 MED ORDER — SODIUM CHLORIDE 0.9% FLUSH: 10.0000 mL | INTRAVENOUS | Status: DC | PRN

## 2019-09-05 MED ORDER — POTASSIUM CHLORIDE CRYS ER 20 MEQ PO TBCR
40.0000 meq | EXTENDED_RELEASE_TABLET | Freq: Once | ORAL | Status: AC
  Administered 2019-09-05: 40 meq via ORAL
  Filled 2019-09-05: qty 2

## 2019-09-05 MED ORDER — SODIUM CHLORIDE 0.9% FLUSH: 10.0000 mL | Freq: Two times a day (BID) | INTRAVENOUS | Status: DC

## 2019-09-05 NOTE — Telephone Encounter (Signed)
[employer] Patient Registration Services is requesting to discuss procedure completed on 08/24/19 with Dr. Burt Knack. Please return call to discuss at [phone] (ext#: [phone]).

## 2019-09-05 NOTE — Telephone Encounter (Signed)
Left a message for the patient to call and schedule an appointment.

## 2019-09-05 NOTE — Progress Notes (Signed)
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 At [facility] patient was followed by both Dr. Tama High with ID and Dr. [name] with cardiology.  TEE was performed on 6/9 with no evidence of vegetation.  Despite this finding, infectious disease recommended transfer to [facility] for removal of ICD considering recurrent group B strep bacteremia.  During patient hospital course, patient also was found to be anemic status post 2 unit of packed red blood cells.  Dr. [name] with GI was consulted who performed a small bowel enteroscopy revealing no evidence of significant pathology in the duodenum, jejunum, esophagus or stomach.  On 08/25/2019 patient underwent extraction of the biventricular ICD system along with [place] right ventricular pacing lead and removal of the biventricular ICD with insertion of a new temp perm transvenous pacemaker. Underwent pacemaker explantation on 6/18.  Dr. Dorothyann Gibbs dentist was consulted for dental caries extraction.  Patient underwent procedure multiple extraction of tooth #2, 28, 31.  2 quadrants of alveoloplasty.  On 6/16.     Assessment & Plan:   Principal Problem:   Bacteremia due to group B Streptococcus Active Problems:   Cardiomyopathy,  ischemic   Permanent atrial fibrillation (HCC)   Benign essential HTN   Mixed hyperlipidemia   Iron deficiency anemia due to chronic blood loss   Coronary artery disease of bypass graft of native heart with stable angina pectoris (HCC)   Acute renal failure superimposed on stage 3a chronic kidney disease (HCC)   Sepsis due to group B Streptococcus (HCC)   Duodenal arteriovenous malformation   Candidal intertrigo   Edema of left upper extremity   Pneumonia of left lower lobe due to group B Streptococcus (HCC)  1-Septic shock likely secondary to group B streptococcus bacteremia Unknown source, but patient with very poor dentition. PNA EEG on 6/9 no evidence of vegetation Blood cultures on 08/26/2019 left after pacemaker removal no growth to date. Patient underwent extraction of the pacemaker on 6/11, he had  temporary pacer. On IV Rocephin.  He needs IV antibiotics through 70/11/2019 Exchange of Pacemaker/defibrilator 6/18 Stable, afebrile.   2-Bilateral pneumonia: Chest x ray Right upper and middle lobe complete opacification.  Flutter valve ordered.  Continue with IV ceftriaxone Nebulizer Pulmonary consulted. Recommend pulmonary toilet.  Saline nebulizer. Vest  Chest x ray today ; diffuse interstitial thickening. Change torsemide to IV lasix.  Continue with iv lasix. Replete potasium.   3-Acute on chronic systolic heart failure: Recent TEE show ejection fraction 55% EP/cardiology on board.  Plan is to start torsemide, Cozaar and Aldactone once blood pressure stabilized Renal function stable.  Resume IV lasix 6-21, chest  Xray with worsening pulmonary edema.  On lasix IV   Left upper extremity edema: Ultrasound negative for DVT 06/2019.  Repeat showed acute superficial vein thrombosis  Acute on chronic blood loss anemia with  iron deficiency: Status post capsule endoscopy without any source of bleeding Status post IV iron, 2 unit of packed red blood cell Hb stable  Continue  with oral iron.   Permanent A. fib: Rate control No anticoagulation due to GI bleed  CAD/CABG, DES Continue with statins  Hypertension Holding Cozaar due to blood pressure being soft  CKD stage IIIa: Monitor Hypokalemia: Replete orally Soft mass upper nasal cavity B/L ; ENT consulted. Discussed with Dr Constance Holster, follow up out patient. Might be nasal polyps.   Pressure Injury 06/21/19 Buttocks Mid Stage 2 -  Partial thickness loss of dermis presenting as a shallow open injury with a red, pink wound bed without slough. (Active)  06/21/19 0700  Location: Buttocks  Location Orientation: Mid  Staging: Stage 2 -  Partial thickness loss of dermis presenting as a shallow open injury with a red, pink wound bed without slough.  Wound Description (Comments):   Present on Admission:                    Estimated body mass index is 27.31 kg/m as calculated from the following:   Height as of this encounter: 6\' 2"  (1.88 m).   Weight as of this encounter: 96.5 kg.   DVT prophylaxis: SCDs no anticoagulation due to GI bleed Code Status: Full code Family Communication: care discussed with patient Disposition Plan:  Status is: Inpatient  Remains inpatient appropriate because:Hemodynamically unstable   Dispo: The patient is from: Home              Anticipated d/c is to: SNF              Anticipated d/c date is: 2 days              Patient currently is medically stable to d/c. snf Back on IV lasix for pulmonary edema. Discharge when he is transition to oral torsemide.         Consultants:   EP  Cardiology  ID  Procedures:   Removal of AICD  SB enteroscopy  Multiple extraction of tooth #2, 28, 31 2 quadrants of alveoloplasty.  Gross debridement of remaining dentition.  Antimicrobials:  Ceftriaxone  Subjective: Use language sign translator.  He denies worsening dyspnea . Denies cough.   Vitals:   09/05/19 0412 09/05/19 0520 09/05/19 0745 09/05/19 0857  BP:  (!) 109/43  (!) 110/46   Pulse: 70  70 71  Resp: 20  20 20   Temp: 98.3 F (36.8 C)  98.3 F (36.8 C)   TempSrc: Oral  Oral   SpO2: 99%  98% 98%  Weight:  96.5 kg    Height:        Intake/Output Summary (Last 24 hours) at 09/05/2019 1226 Last data filed at 09/05/2019 0522 Gross per 24 hour  Intake 1230 ml  Output 2050 ml  Net -820 ml   Filed Weights   09/03/19 0357 09/04/19 0500 09/05/19 0520  Weight: 95 kg 94.6 kg 96.5 kg    Examination:  General exam: NAD Respiratory system: B/L crackles Cardiovascular system; S 1, S 2 RRR Gastrointestinal system: BS present, soft, nt Central nervous system: Non focal.   Extremities: Trace edema   Data Reviewed: I have personally reviewed following labs and imaging studies  CBC: Recent Labs  Lab 08/31/19 0225 09/01/19 0341 09/02/19 0550 09/03/19 0400 09/04/19 0403  WBC 6.7 5.6 5.3 5.4 5.2  NEUTROABS 5.5 4.1 4.1 4.1 3.9  HGB 8.0* 7.7* 7.6* 7.5* 7.5*  HCT 27.4* 26.5* 26.1* 25.3* 25.8*  MCV 86.2 87.2 86.1 87.8 87.5  PLT 245 215 206 199 063   Basic Metabolic Panel: Recent Labs  Lab 09/02/19 0550 09/03/19 0400 09/03/19 1430 09/04/19 0403 09/05/19 0824  NA 139 139 139 141 141  K 3.6 3.3* 3.6 3.7 3.3*  CL 102 101 102 103 102  CO2 30 32 31 32 31  GLUCOSE 130* 93 118* 98 125*  BUN 16 12 12 12 12   CREATININE 1.06 0.86 0.88 0.86 1.01  CALCIUM 7.8* 7.7* 7.7* 7.7* 8.0*   GFR: Estimated Creatinine Clearance: 65.6 mL/min (by C-G formula based on SCr of 1.01 mg/dL). Liver Function Tests: No results for input(s): AST, ALT, ALKPHOS, BILITOT, PROT, ALBUMIN in the last 168 hours. No results for input(s): LIPASE, AMYLASE in the last 168 hours. No results for input(s): AMMONIA in the last 168 hours. Coagulation Profile: No results for input(s): INR, PROTIME in the last 168 hours. Cardiac Enzymes: No results for input(s): CKTOTAL, CKMB, CKMBINDEX, TROPONINI in the last 168 hours. BNP (last 3 results) No results for input(s):  PROBNP in the last 8760 hours. HbA1C: No results for input(s): HGBA1C in the last 72 hours. CBG: Recent Labs  Lab 09/02/19 0510 09/02/19 1628 09/03/19 0534 09/04/19 0631 09/05/19 0603  GLUCAP 98 98 86 92 99   Lipid Profile: No results for input(s): CHOL, HDL, LDLCALC, TRIG, CHOLHDL, LDLDIRECT in the last 72 hours. Thyroid Function Tests: No results for input(s): TSH, T4TOTAL, FREET4, T3FREE, THYROIDAB in the last 72 hours. Anemia Panel: Recent Labs    09/03/19 0400  VITAMINB12 1,478*   Sepsis Labs: No results for input(s): PROCALCITON, LATICACIDVEN in the last 168 hours.  Recent Results (from the past 240 hour(s))  Surgical PCR screen     Status: None   Collection Time: 08/31/19  2:20 PM   Specimen: Nasal Mucosa; Nasal Swab  Result Value Ref Range Status   MRSA, PCR NEGATIVE NEGATIVE Final   Staphylococcus aureus NEGATIVE NEGATIVE Final    Comment: (NOTE) The Xpert SA Assay (FDA approved for NASAL specimens in patients 96 years of age and older), is one component of a comprehensive surveillance program. It is not intended to diagnose infection nor to guide or monitor treatment. Performed at [facility], [place] [address] [place], [place] [address]          Radiology Studies: DG Chest 2 View  Result Date: 09/04/2019 CLINICAL DATA:  Pneumonia EXAM: CHEST - 2 VIEW COMPARISON:  09/02/2017 FINDINGS: Small bilateral pleural effusions. Bilateral diffuse interstitial thickening. Bibasilar hazy airspace disease. No pneumothorax. Stable cardiomegaly. Prior CABG. Cardiac pacemaker is noted. Thoracic aortic atherosclerosis. Severe osteoarthritis of the right glenohumeral joint. IMPRESSION: Cardiomegaly with bilateral pleural effusions and diffuse interstitial thickening. Differential considerations include pulmonary edema with bibasilar atelectasis versus pneumonia. Electronically Signed   By: Kathreen Devoid   On: 09/04/2019 11:15        Scheduled Meds: .  Chlorhexidine Gluconate Cloth  6 each Topical Daily  . ferrous sulfate  325 mg Oral Daily  . furosemide  40 mg Intravenous Q12H  . Gerhardt's butt cream   Topical BID  . heparin injection (subcutaneous)  5,000 Units Subcutaneous Q8H  . multivitamin with minerals  1 tablet Oral Daily  . pantoprazole  40 mg Oral BID  . polyethylene glycol  17 g Oral Daily  . potassium chloride  40 mEq Oral Once  . senna  1 tablet Oral Daily  . simvastatin  20  mg Oral q1800  . sodium chloride flush  3 mL Intravenous Q12H   Continuous Infusions: . sodium chloride 10 mL/hr at 08/30/19 0400  . cefTRIAXone (ROCEPHIN) IVPB 2 gram/100 mL NS (Mini-Bag Plus) 2 g (09/05/19 0048)     LOS: 12 days    Time spent: 35 minutes.     Elmarie Shiley, MD [facility]   If 7PM-7AM, please contact night-coverage [email or web address]  09/05/2019, 12:26 PM

## 2019-09-05 NOTE — Telephone Encounter (Signed)
Call was placed under the wrong MD. Call should be for Dr. [name]. I will send call to Dr. Tanna Furry nurse Clent Ridges RN

## 2019-09-05 NOTE — Telephone Encounter (Signed)
Call went to VM.  Left phone number for device clinic for further questions.

## 2019-09-05 NOTE — Progress Notes (Signed)
RT NOTE: Pt did not want to do vest therapy at this time. He stated he wants to sleep.

## 2019-09-05 NOTE — Telephone Encounter (Signed)
Needs 2 week hosp f/u

## 2019-09-05 NOTE — Progress Notes (Signed)
   09/05/19 0857  Assess: MEWS Score  Pulse Rate 71  Resp 20  SpO2 98 %  O2 Device Nasal Cannula  O2 Flow Rate (L/min) 2 L/min  Assess: MEWS Score  MEWS Temp 0  MEWS Systolic 0  MEWS Pulse 0  MEWS RR 0  MEWS LOC 0  MEWS Score 0  MEWS Score Color Nyoka Cowden

## 2019-09-05 NOTE — Progress Notes (Signed)
Physical Therapy Treatment Patient Details Name: Thomas [name] MRN: [id number] DOB: 1934/10/24 Today's Date: 09/05/2019    History of Present Illness 85 yo deaf male admitted to [facility] 6/4 with weakness, sepsis due to LLL PNA with recommendation for transfer to [facility] for ICD removal due to group b strep. ICD removal on 6/11 with temp pacer placed. Multiple tooth extractions 6/16. Pacer implant 6/18. PMHx: CAD, ICD, HF, CKD, AFib, prostate CA, sepsis, CABG, ICM    PT Comments    Pt more fatigued today. LUE with copious weeping. Continue to recommend ST-SNF.    Follow Up Recommendations  SNF;Supervision/Assistance - 24 hour     Equipment Recommendations  Other (comment) (rollator)    Recommendations for Other Services       Precautions / Restrictions Precautions Precautions: ICD/Pacemaker;Fall Precaution Comments: for RUE. LUE had pacer removed    Mobility  Bed Mobility Overal bed mobility: Needs Assistance Bed Mobility: Supine to Sit     Supine to sit: Min assist;HOB elevated Sit to supine: Min assist   General bed mobility comments: assist for pt to pull up into sitting. Assist to bring feet back up into bed.   Transfers Overall transfer level: Needs assistance Equipment used: 4-wheeled walker Transfers: Sit to/from Stand Sit to Stand: From elevated surface;Mod assist         General transfer comment: Assist to bring hips up and for balance  Ambulation/Gait Ambulation/Gait assistance: Min assist Gait Distance (Feet): 30 Feet Assistive device: 4-wheeled walker Gait Pattern/deviations: Step-through pattern;Decreased stride length;Trunk flexed Gait velocity: decr Gait velocity interpretation: 1.31 - 2.62 ft/sec, indicative of limited community ambulator General Gait Details: Assist for balance and support and cues to stand more erect. Pt fatigued quickly and had to sit on rollator and ride back to room   Stairs             Wheelchair Mobility     Modified Rankin (Stroke Patients Only)       Balance Overall balance assessment: Needs assistance Sitting-balance support: No upper extremity supported;Feet supported Sitting balance-Leahy Scale: Fair     Standing balance support: Bilateral upper extremity supported;During functional activity Standing balance-Leahy Scale: Poor Standing balance comment: rollator and min guard for static standing                            Cognition Arousal/Alertness: Awake/alert Behavior During Therapy: WFL for tasks assessed/performed Overall Cognitive Status: Within Functional Limits for tasks assessed                                 General Comments: better adherence with pacer precautions      Exercises      General Comments General comments (skin integrity, edema, etc.): HR stable. Unable to get SpO2 reading. Pt amb on RA with dyspnea 2/4. Replaced O2 when returned to room.       Pertinent Vitals/Pain Pain Assessment: No/denies pain    Home Living                      Prior Function            PT Goals (current goals can now be found in the care plan section) Acute Rehab PT Goals Patient Stated Goal: to go home Progress towards PT goals: Not progressing toward goals - comment    Frequency    Min 3X/week  PT Plan Current plan remains appropriate    Co-evaluation              AM-PAC PT "6 Clicks" Mobility   Outcome Measure  Help needed turning from your back to your side while in a flat bed without using bedrails?: A Little Help needed moving from lying on your back to sitting on the side of a flat bed without using bedrails?: A Little Help needed moving to and from a bed to a chair (including a wheelchair)?: A Little Help needed standing up from a chair using your arms (e.g., wheelchair or bedside chair)?: A Lot Help needed to walk in hospital room?: A Little Help needed climbing 3-5 steps with a railing? : Total 6  Click Score: 15    End of Session Equipment Utilized During Treatment: Gait belt Activity Tolerance: Patient limited by fatigue Patient left: with call bell/phone within reach;in bed;with bed alarm set Nurse Communication: Mobility status PT Visit Diagnosis: History of falling (Z91.81);Difficulty in walking, not elsewhere classified (R26.2);Muscle weakness (generalized) (M62.81);Unsteadiness on feet (R26.81)     Time: 7943-2761 PT Time Calculation (min) (ACUTE ONLY): 37 min  Charges:  $Gait Training: 23-37 mins                     [place] Pager [phone] Office [place] 09/05/2019, 10:08 AM

## 2019-09-05 NOTE — Progress Notes (Signed)
Name: Thomas [name] MRN: [id number] DOB: [birth year]    ADMISSION DATE:  08/24/2019 CONSULTATION DATE:  09/05/2019  REFERRING MD :  Tyrell Antonio, triad  CHIEF COMPLAINT: Abnormal x-ray  BRIEF PATIENT DESCRIPTION: 85 year old deaf man admitted with recurrent group B strep bacteremia necessitating removal of ICD/pacemaker.  PCCM consulted for atelectasis  HISTORY OF PRESENT ILLNESS: 85 year old this man admitted 06/2019 with group B strep bacteremia, treated with 6 weeks of antibiotics.  Readmitted 6/4 to [facility] and found to have left lower lobe pneumonia and recurrent group B strep bacteremia.  TEE 6/9 did not show any evidence of vegetation.  He underwent extraction of ICD/pacer system and transvenous pacer was placed.  He also underwent dental extraction. He was maintained on IV Rocephin .  Oxygen requirements have varied from 2 to 3 L.  We are consulted for abnormal chest x-ray. Chest x-ray 6/18 showed opacification of the right upper lung with large right effusion, small left effusion also noted.  Trachea appears to be shifted to the right Chest x-ray from 6/15 showed bibasilar consolidation and effusions with cardiomegaly  PMH - CAD, high-grade AV block status post Medtronic dual-chamber biventricular ICD, chronic systolic heart failure ejection fraction as low as 30%, recent ejection fraction 55% on TEE on 08/23/2019, stage III CKD, chronic A. 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 6/16 dental extraction  STUDIES:  CXR 6/20 > Increasing opacity in the right mid lung could represent atelectasis underlying layering effusion versus developing infiltrate. Recommend clinical correlation and attention  on follow-up. Layering pleural effusions with underlying atelectasis.  Cardiomegaly and mild edema.  CXR 6/21>> personally reviewed Small bilateral pleural effusions. Bilateral diffuse interstitial thickening. Bibasilar hazy airspace disease. No pneumothorax. Stable cardiomegaly.  Cardiomegaly with bilateral pleural effusions and diffuse interstitial thickening. Differential considerations include pulmonary edema with bibasilar atelectasis versus pneumonia.   PAST MEDICAL HISTORY :   has a past medical history of AICD (automatic cardioverter/defibrillator) present, Bacteremia due to group B Streptococcus, Biventricular ICD (implantable cardioverter-defibrillator) in place (03/24/2005), CHF (congestive heart failure) ([place]), CKD (chronic kidney disease), stage III, Coronary artery disease, Deaf, Dysrhythmia, History of abdominal aortic aneurysm, History of bleeding peptic ulcer (1980), History of epididymitis (2013), HTN (hypertension), Hydronephrosis with ureteropelvic junction obstruction, Hydroureter on left (2009), Hypertension, Ischemic cardiomyopathy, Moderate tricuspid regurgitation, PAF (paroxysmal atrial fibrillation) ([place]), Presence of permanent cardiac pacemaker (2002), Prostate cancer [facility]), Status post coronary artery bypass grafting (1986), and Testicular swelling.  has a past surgical history that includes Coronary artery bypass graft (1986); Insert / replace / remove pacemaker; Transurethral resection of prostate; Cardiac catheterization (12/10/2011); 2-D echocardiogram (11/20/2011); Persantine Myoview (05/06/2010); left heart catheterization with coronary/graft angiogram (N/A, 12/10/2011); bi-ventricular implantable cardioverter defibrillator (N/A, 12/16/2012); left heart catheterization with coronary/graft angiogram (N/A, 09/25/2013); Cataract extraction w/PHACO (Right, 10/12/2017); Esophagogastroduodenoscopy (egd) with propofol (N/A, 05/27/2018); Esophagogastroduodenoscopy (N/A,  07/13/2018); Colonoscopy (N/A, 07/13/2018); Esophagogastroduodenoscopy (N/A, 09/14/2018); enteroscopy (N/A, 09/14/2018); Esophagogastroduodenoscopy (N/A, 06/21/2019); TEE without cardioversion (N/A, 06/22/2019); enteroscopy (N/A, 08/22/2019); TEE without cardioversion (N/A, 08/23/2019); Icd lead removal (Left, 08/25/2019); TEE without cardioversion (N/A, 08/25/2019); Multiple extractions with alveoloplasty (Bilateral, 08/30/2019); and BIV ICD INSERTION CRT-D (N/A, 09/01/2019). Prior to Admission medications   Medication Sig Start Date End Date Taking? Authorizing Provider  acetaminophen (TYLENOL) 325 MG tablet Take 2 tablets (650 mg total) by mouth every 6 (six) hours as needed for mild pain or fever. 08/23/19   Loletha Grayer, MD  albuterol (PROVENTIL) (2.5 MG/3ML) 0.083% nebulizer solution Inhale 3 mLs into the lungs every 4 (four) hours as needed for wheezing or shortness of breath. 08/23/19   Wieting, Richard, MD  cefTRIAXone 2 g in sodium chloride 0.9 % 100 mL Inject 2 g into the vein daily. 08/24/19   Loletha Grayer, MD  dextromethorphan-guaiFENesin [facility] DM) 30-600 MG 12hr tablet Take 1 tablet by mouth 2 (two) times daily as needed for cough. 08/23/19   Loletha Grayer, MD  ferrous sulfate 325 (65 FE) MG tablet Take 1 tablet (325 mg total) by mouth daily for 30 days. 05/28/18   Elodia Florence., MD  ipratropium-albuterol (DUONEB) 0.5-2.5 (3) MG/3ML SOLN Take 3 mLs by nebulization every 6 (six) hours. 08/23/19   Loletha Grayer, MD  losartan (COZAAR) 25 MG tablet Take 0.5 tablets (12.5 mg total) by mouth daily. 06/26/19   Danford, Suann Larry, MD  Multiple Vitamin (MULTIVITAMIN WITH MINERALS) TABS tablet Take 1 tablet by mouth daily. 05/29/19   Nicole Kindred A, DO  ondansetron (ZOFRAN) 4 MG/2ML SOLN injection Inject 2 mLs (4 mg total) into the vein every 8 (eight) hours as needed for nausea or vomiting. 08/23/19   Loletha Grayer, MD  pantoprazole (PROTONIX) 40 MG injection Inject 40 mg into the vein every 12  (twelve) hours. 08/23/19   Loletha Grayer, MD  simvastatin (ZOCOR) 20 MG tablet TAKE 1 TABLET BY MOUTH EVERY DAY Patient taking differently: Take 20 mg by mouth daily.  04/07/19   Erlene Quan, PA-C  spironolactone (ALDACTONE) 25 MG tablet Take 1 tablet (25 mg total) by mouth daily. 08/01/19 10/30/19  Alisa Graff, FNP  torsemide (DEMADEX) 20 MG tablet Take 2 tablets (40 mg total) by mouth daily. 08/23/19   Loletha Grayer, MD   Allergies  Allergen Reactions  . Entresto [Sacubitril-Valsartan] Swelling    And bruising of arm  . Phenazopyridine Nausea Only and Other (See Comments)    GI UPSET  . Ramipril Other (See Comments)    unk Other reaction(s): Other (See Comments), Unknown unk    FAMILY HISTORY:  family history includes Hypertension in his father. SOCIAL HISTORY:  reports that he quit smoking about 34 years ago. He has never used smokeless tobacco. He reports that he does not drink alcohol and does not use drugs.  SUBJECTIVE:  Pt communicating via pad a paper as he is deaf States he is feeling a bit better, and that breathing may be a bit improved.  Now on 2 L [place] with sats of 98% Endorses thick yellow secretions, but states he is mobilizing them States he is using his IS and flutter valve States he has pain at pacer site 6/10 Net negative 2976 cc's Slight increase in  Creatinine with addition of IV Lasix Hypokalemia Afebrile   VITAL SIGNS: Temp:  [97.6 F (36.4 C)-98.3 F (36.8 C)] 98.3 F (36.8 C) (06/22 0745) Pulse Rate:  [50-71] 71 (06/22 0857) Resp:  [17-22] 20 (06/22 0857) BP: (109-135)/(42-70) 110/46 (06/22 0745) SpO2:  [96 %-100 %] 98 % (06/22 0857) Weight:  [96.5 kg] 96.5 kg (06/22 0520)  PHYSICAL EXAMINATION: General: Elderly man, no distress, in bed on 2 L [place] Neuro: Awake, alert, follows commands , HOH, communicated through writing notes HEENT:NCAT,  No JVD, No LAD,no pallor or icterus Cardiovascular: Paced, S1, S2,  RRR  No RMG Lungs: Bilateral  chest excursion, BS diminished per bases, , rhonchi upper lobes Abdomen: Soft and nontender, ND, abdomen, BS +, Body mass index is 27.31 kg/m. Musculoskeletal: +1-2 edema to BLE and BUE Skin: No rash, no lesions, Clean dry and intact  Recent Labs  Lab 09/03/19 1430 09/04/19 0403 09/05/19 0824  NA 139 141 141  K 3.6 3.7 3.3*  CL 102 103 102  CO2 31 32 31  BUN 12 12 12   CREATININE 0.88 0.86 1.01  GLUCOSE 118* 98 125*   Recent Labs  Lab 09/02/19 0550 09/03/19 0400 09/04/19 0403  HGB 7.6* 7.5* 7.5*  HCT 26.1* 25.3* 25.8*  WBC 5.3 5.4 5.2  PLT 206 199 188   DG Chest 2 View  Result Date: 09/04/2019 CLINICAL DATA:  Pneumonia EXAM: CHEST - 2 VIEW COMPARISON:  09/02/2017 FINDINGS: Small bilateral pleural effusions. Bilateral diffuse interstitial thickening. Bibasilar hazy airspace disease. No pneumothorax. Stable cardiomegaly. Prior CABG. Cardiac pacemaker is noted. Thoracic aortic atherosclerosis. Severe osteoarthritis of the right glenohumeral joint. IMPRESSION: Cardiomegaly with bilateral pleural effusions and diffuse interstitial thickening. Differential considerations include pulmonary edema with bibasilar atelectasis versus pneumonia. Electronically Signed   By: Kathreen Devoid   On: 09/04/2019 11:15    ASSESSMENT / PLAN:  Acute hypoxic respiratory failure with right upper lobe atelectasis/consolidation and small bilateral pleural effusion -CXR 6/21 with Cardiomegaly, bilateral pleural effusions and diffuse interstitial thickening >> resolution of right upper lobe consolidation that was noted on 6/18 Plan - Continue aggressive  tracheobronchial toilet > Started on Hypertonic [place]ine on 6/19 with plans for 3 days.   -Chest PT with flutter valve and via bed > Unable to tolerate Chest PT due to pain at pacer site  -Early ambulation with PT when transvenous pacemaker is out -Currently on 2 L [place],  No distress noted.  Wean as tolerated for sats > 94% -Continue to diuresis as below.  Started back on IV lasix today. Appears very volume overloaded.  - Follow K and creatinine  with BMET while on IV Lasix>> replete as needed - CXR 6/23 to evaluate for improvement/ change in effusions    Group B strep bacteremia -ceftriaxone is planned through 04/17/252 Chronic diastolic heart failure - Continuing to diuresis. Net negative 2.1 L for stay    Magdalen Spatz, , AGACNP-BC  Pulmonary & Critical Care  PCCM Pgr: [phone] See Amion for personal pager  09/05/2019 11:07 AM

## 2019-09-06 ENCOUNTER — Inpatient Hospital Stay (HOSPITAL_COMMUNITY): Payer: PPO

## 2019-09-06 DIAGNOSIS — K0530 Chronic periodontitis, unspecified: Secondary | ICD-10-CM

## 2019-09-06 DIAGNOSIS — R5381 Other malaise: Secondary | ICD-10-CM

## 2019-09-06 DIAGNOSIS — J189 Pneumonia, unspecified organism: Secondary | ICD-10-CM

## 2019-09-06 DIAGNOSIS — I442 Atrioventricular block, complete: Secondary | ICD-10-CM

## 2019-09-06 DIAGNOSIS — I5023 Acute on chronic systolic (congestive) heart failure: Secondary | ICD-10-CM

## 2019-09-06 DIAGNOSIS — H9193 Unspecified hearing loss, bilateral: Secondary | ICD-10-CM

## 2019-09-06 DIAGNOSIS — R601 Generalized edema: Secondary | ICD-10-CM

## 2019-09-06 DIAGNOSIS — I890 Lymphedema, not elsewhere classified: Secondary | ICD-10-CM

## 2019-09-06 LAB — CBC
HCT: 25.4 % — ABNORMAL LOW (ref 39.0–52.0)
Hemoglobin: 7.6 g/dL — ABNORMAL LOW (ref 13.0–17.0)
MCH: 26 pg (ref 26.0–34.0)
MCHC: 29.9 g/dL — ABNORMAL LOW (ref 30.0–36.0)
MCV: 87 fL (ref 80.0–100.0)
Platelets: 176 10*3/uL (ref 150–400)
RBC: 2.92 MIL/uL — ABNORMAL LOW (ref 4.22–5.81)
RDW: 21.4 % — ABNORMAL HIGH (ref 11.5–15.5)
WBC: 4.9 10*3/uL (ref 4.0–10.5)
nRBC: 0 % (ref 0.0–0.2)

## 2019-09-06 LAB — BASIC METABOLIC PANEL
Anion gap: 6 (ref 5–15)
BUN: 12 mg/dL (ref 8–23)
CO2: 32 mmol/L (ref 22–32)
Calcium: 7.9 mg/dL — ABNORMAL LOW (ref 8.9–10.3)
Chloride: 102 mmol/L (ref 98–111)
Creatinine, Ser: 0.94 mg/dL (ref 0.61–1.24)
GFR calc Af Amer: >60 mL/min (ref >60)
GFR calc non Af Amer: >60 mL/min (ref >60)
Glucose, Bld: 89 mg/dL (ref 70–99)
Potassium: 3.7 mmol/L (ref 3.5–5.1)
Sodium: 140 mmol/L (ref 135–145)

## 2019-09-06 LAB — GLUCOSE, CAPILLARY: Glucose-Capillary: 96 mg/dL (ref 70–99)

## 2019-09-06 LAB — PREPARE RBC (CROSSMATCH)

## 2019-09-06 MED ORDER — SODIUM CHLORIDE 0.9% IV SOLUTION: Freq: Once | INTRAVENOUS | Status: DC

## 2019-09-06 NOTE — Plan of Care (Signed)

## 2019-09-06 NOTE — Progress Notes (Signed)
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  PCCM consulted for atelectasis  HISTORY OF PRESENT ILLNESS:  85 year old this man admitted 06/2019 with group B strep bacteremia, treated with 6 weeks of antibiotics.  Readmitted 6/4 to [facility] and found to have left lower lobe pneumonia and recurrent group B strep bacteremia.  TEE 6/9 did not show any evidence of vegetation.  He underwent extraction of ICD/pacer system and transvenous pacer was placed.  He also underwent dental extraction. He was maintained on IV Rocephin .  Oxygen requirements have varied from 2 to 3 L.  We are consulted for abnormal chest x-ray. Chest x-ray 6/18 showed opacification of the right upper lung with large right effusion, small left effusion also noted.  Trachea appears to be shifted to the right Chest x-ray from 6/15 showed bibasilar consolidation and effusions with cardiomegaly  PMH - CAD, high-grade AV block status post Medtronic dual-chamber biventricular ICD, chronic systolic heart failure ejection fraction as low as 30%, recent ejection fraction 55% on TEE on 08/23/2019, stage III CKD, chronic A. 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 6/16 dental extraction  STUDIES:  CXR 6/20 > Increasing opacity in the right mid lung could represent atelectasis underlying layering effusion versus developing infiltrate. Recommend clinical correlation and  attention on follow-up. Layering pleural effusions with underlying atelectasis.  Cardiomegaly and mild edema.  CXR 6/21> personally reviewed Small bilateral pleural effusions. Bilateral diffuse interstitial thickening. Bibasilar hazy airspace disease. No pneumothorax. Stable cardiomegaly.  Cardiomegaly with bilateral pleural effusions and diffuse interstitial thickening. Differential considerations include pulmonary edema with bibasilar atelectasis versus pneumonia.  CXR 6/23 > Improving bilateral pleural effusions and interstitial edema.  PAST MEDICAL HISTORY :  Status post coronary artery bypass grafting 1986 Prostate cancer Paroxysmal atrial fibrillation Moderate tricuspid regurgitation Ischemic cardiomyopathy Hypertension History of bleeding peptic ulcer Abdominal aortic aneurysm Dyslipidemia Death CKD stage III Biventricular ICD Bacteremia due to group B streptococcus  Prior to Admission medications   Medication Sig Start Date End Date Taking? Authorizing Provider  acetaminophen (TYLENOL) 325 MG tablet Take 2 tablets (650 mg total) by mouth every 6 (six) hours as needed for mild pain or fever. 08/23/19   Loletha Grayer, MD  albuterol (PROVENTIL) (2.5 MG/3ML) 0.083% nebulizer solution Inhale 3 mLs into the lungs every 4 (four) hours as needed for wheezing or shortness of breath. 08/23/19   Wieting, Richard, MD  cefTRIAXone 2 g in sodium chloride 0.9 % 100 mL Inject 2 g into the vein daily. 08/24/19   Loletha Grayer, MD  dextromethorphan-guaiFENesin [facility] DM) 30-600 MG 12hr tablet Take 1 tablet by mouth 2 (two) times daily as needed for cough. 08/23/19   Loletha Grayer, MD  ferrous sulfate 325 (65 FE) MG tablet Take 1 tablet (325 mg total) by mouth daily for 30 days. 05/28/18   Elodia Florence., MD  ipratropium-albuterol (DUONEB) 0.5-2.5 (3) MG/3ML SOLN Take 3 mLs by nebulization every 6 (six) hours. 08/23/19  Loletha Grayer, MD  losartan (COZAAR) 25 MG tablet Take  0.5 tablets (12.5 mg total) by mouth daily. 06/26/19   Danford, Suann Larry, MD  Multiple Vitamin (MULTIVITAMIN WITH MINERALS) TABS tablet Take 1 tablet by mouth daily. 05/29/19   Nicole Kindred A, DO  ondansetron (ZOFRAN) 4 MG/2ML SOLN injection Inject 2 mLs (4 mg total) into the vein every 8 (eight) hours as needed for nausea or vomiting. 08/23/19   Loletha Grayer, MD  pantoprazole (PROTONIX) 40 MG injection Inject 40 mg into the vein every 12 (twelve) hours. 08/23/19   Loletha Grayer, MD  simvastatin (ZOCOR) 20 MG tablet TAKE 1 TABLET BY MOUTH EVERY DAY Patient taking differently: Take 20 mg by mouth daily.  04/07/19   Erlene Quan, PA-C  spironolactone (ALDACTONE) 25 MG tablet Take 1 tablet (25 mg total) by mouth daily. 08/01/19 10/30/19  Alisa Graff, FNP  torsemide (DEMADEX) 20 MG tablet Take 2 tablets (40 mg total) by mouth daily. 08/23/19   Loletha Grayer, MD   Allergies  Allergen Reactions   Delene Loll [Sacubitril-Valsartan] Swelling    And bruising of arm   Phenazopyridine Nausea Only and Other (See Comments)    GI UPSET   Ramipril Other (See Comments)    unk Other reaction(s): Other (See Comments), Unknown unk    FAMILY HISTORY:  family history includes Hypertension in his father. SOCIAL HISTORY:  reports that he quit smoking about 34 years ago. He has never used smokeless tobacco. He reports that he does not drink alcohol and does not use drugs.  SUBJECTIVE:  Patient seen lying in bed in no acute distress, written language used for communication.  Patient reported continued to dyspnea but endorsed improvement compared to prior.  VITAL SIGNS: Temp:  [97.9 F (36.6 C)-98.2 F (36.8 C)] 98.2 F (36.8 C) (06/23 0826) Pulse Rate:  [69-95] 71 (06/23 0826) Resp:  [13-19] 15 (06/23 0445) BP: (107-128)/(30-58) 128/39 (06/23 0826) SpO2:  [97 %-100 %] 100 % (06/23 0826) Weight:  [93.4 kg] 93.4 kg (06/23 0243)  PHYSICAL EXAMINATION: General: Very pleasant elderly  gentleman lying in bed in no acute distress HEENT: [place]/AT, MM pink/moist, PERRL, sclera nonicteric Neuro: Alert and oriented, written language used for communication CV: s1s2 regular rate and rhythm, no murmur, rubs, or gallops,  PULM: Slightly diminished, no added breath sounds, no increased work of breathing, oxygen saturations 99 to 100% on 2 L nasal cannula GI: soft, bowel sounds active in all 4 quadrants, non-tender, non-distended Extremities: warm/dry, 1+ bilateral upper and lower extremity edema  Skin: no rashes or lesions   Recent Labs  Lab 09/04/19 0403 09/05/19 0824 09/06/19 0441  NA 141 141 140  K 3.7 3.3* 3.7  CL 103 102 102  CO2 32 31 32  BUN 12 12 12   CREATININE 0.86 1.01 0.94  GLUCOSE 98 125* 89   Recent Labs  Lab 09/03/19 0400 09/04/19 0403 09/06/19 0441  HGB 7.5* 7.5* 7.6*  HCT 25.3* 25.8* 25.4*  WBC 5.4 5.2 4.9  PLT 199 188 176   DG CHEST PORT 1 VIEW  Result Date: 09/06/2019 CLINICAL DATA:  Shortness of breath. Chronic bilateral pleural effusions. EXAM: PORTABLE CHEST 1 VIEW COMPARISON:  09/04/2019 and 09/03/2019 FINDINGS: There has been improvement in the haziness at both lung bases, probably representing a combination of decreasing effusions and improved mild interstitial edema. Chronic cardiomegaly. Chronic pulmonary vascular congestion. AICD in place. Aortic atherosclerosis. CABG. IMPRESSION: Improving bilateral pleural effusions and interstitial edema. Electronically Signed   By:  Lorriane Shire M.D.   On: 09/06/2019 09:41    ASSESSMENT / PLAN:  Acute hypoxic respiratory failure with right upper lobe atelectasis/consolidation and small bilateral pleural effusion -CXR 6/21 with Cardiomegaly, bilateral pleural effusions and diffuse interstitial thickening  -Resolution of right upper lobe consolidation that was noted on 6/18 Plan: Continue supplemental oxygen, wean as able SPO2 goal greater than 92% Continue aggressive chest PT Encourage frequent use  of incentive [place] flutter valve Continue to diurese as able Continue to encourage early mobilization, PT ordered Follow intermittent chest x-ray   Group B strep bacteremia  Plan: Continue current antibiotic regiment, ceftriaxone through 09/22/2019 Continue to follow CBC and fever curve  Chronic diastolic heart failure  Plan: Management per primary team  Diurese as tolerated  Closely monitor volume status  Strict intake and output    Rest of chronic medical conditions managed by primary team  Signature Johnsie Cancel, NP-C [place] / Pager information can be found on Amion  09/06/2019, 1:44 PM

## 2019-09-06 NOTE — Progress Notes (Signed)
OT Cancellation Note  Patient Details Name: Thomas [name] MRN: [id number] DOB: 1934-12-16   Cancelled Treatment:    Reason Eval/Treat Not Completed: Patient declined. Provided education and encouragement to participate in OOB activity. Patient states between spouse, sons and daughter he has assist he needs at home. Patient repeating LEs are weak, OT try to educate participating in activity will build strength however patient refusing OOB. Try to encourage bed level exercises, ultimately patient refusing OT services this afternoon. ALS in person interpreter present throughout session.   Delbert Phenix OT OT office: [phone]   Rosemary Holms 09/06/2019, 2:50 PM

## 2019-09-06 NOTE — Progress Notes (Signed)
PROGRESS NOTE  Thomas [name] AYT:[id number] DOB: November 28, 1934   PCP: [name], MD  Patient is from: Home  DOA: 08/24/2019 LOS: 7  Brief Narrative / Interim history: 85 year old male with impaired hearing, CAD, high-grade AVB s/p BiV-ICD, systolic CHF with recovered EF, CKD-3A, A. fib not on AC due to GI bleed/duodenal AVM, lymphedema, prostate cancer and recent group B strep bacteremia s/p 6 weeks of antibiotics admitted to [facility] after presenting with weakness and presyncope and found to have sepsis secondary to LLL pneumonia and recurrent group B strep bacteremia.  Heart TEE without evidence of vegetation on 08/23/2019.  ID at [facility] recommended transfer to [facility] for ICD removal given recurrent bacteremia.  Patient underwent extraction of BiV-ICD system and insertion of new temporary transvenous pacemaker on 08/25/2019.  He had right-sided BiV-ICD insertion CRT-D on 6/18.  He also underwent dental extraction involving tooth #2, 28 and 31 with 2 quadrants of alveoloplasty.  Hospital course complicated by anemia requiring 2 units of blood transfusions.  He had small bowel enteroscopy without significant pathology.  Subjective: Seen and examined earlier this morning.  No major events overnight or this morning.  No complaints.  He denies pain, shortness of breath, GI or UTI symptoms.  Video sign language interpreter with ID number [id number] utilized for this encounter.  Objective: Vitals:   09/05/19 2345 09/06/19 0243 09/06/19 0445 09/06/19 0826  BP: (!) 125/58  (!) 122/45 (!) 128/39  Pulse:   69 71  Resp:   15   Temp:   98.2 F (36.8 C) 98.2 F (36.8 C)  TempSrc:   Oral Oral  SpO2:   99% 100%  Weight:  93.4 kg    Height:        Intake/Output Summary (Last 24 hours) at 09/06/2019 1207 Last data filed at 09/06/2019 0130 Gross per 24 hour  Intake --  Output 450 ml  Net -450 ml   Filed Weights   09/04/19 0500 09/05/19 0520 09/06/19 0243  Weight: 94.6 kg 96.5 kg 93.4 kg     Examination:  GENERAL: No apparent distress.  Nontoxic. HEENT: MMM.  Vision and hearing grossly intact.  NECK: Supple.  No apparent JVD.  RESP: 99% on 2 L.  No IWOB.  Fair aeration bilaterally. CVS:  RRR. Heart sounds normal.  ABD/GI/GU: BS+. Abd soft, NTND.  MSK/EXT:  Moves extremities. No apparent deformity.  Significant lymphedema in BLE and LUE. SKIN: Dressing over left upper arm right upper chest DCI. NEURO: Awake, alert and oriented appropriately.  No apparent focal neuro deficit. PSYCH: Calm. Normal affect.  Procedures:  6/7-small bowel enteroscopy without significant finding  6/8-TEE without vegetation  6/11-ICD extraction  6/16-dental extraction including tooth #2, 28, 31, and 2 quadrants of alveoloplasty  6/18-right-sided BiV-ICD insertion CRT-D on 6/18.  Microbiology summarized: 6/11-MRSA PCR negative 6/12-blood cultures NGTD 6/17-MRSA PCR negative  Assessment & Plan: Septic shock likely secondary to group B streptococcus bacteremia-unclear source of bacteremia. -TEE without evidence of vegetation. -Blood cultures on 6/12 NGTD. -Hardware extraction and implantation as above. -IV ceftriaxone through 09/22/2019 per ID recommendation.  Chronic apical periodontitis/dental caries: s/p multiple dental extraction, 2 quadrants of alveoloplasty and gross debridement -Continue oral care  Acute respiratory failure with hypoxia-multifactorial including pneumonia, pleural effusion and CHF exacerbation.  Improving. -PCCM following. -Treat treatable causes as below. -Wean oxygen as able -OOB/PT/OT  Bilateral pneumonia: CXR with RUL and RML complete opacification.  Some overlap of pulmonary edema -On ceftriaxone, IV diuretics, saline nebulizer, vest and bronchodilators.  Acute on chronic systolic heart failure with recovered EF to 55%.  Patient with significant lymphedema likely chronic.  On IV Lasix 40 mg twice daily.  I&O incomplete.  Renal function stable.  CXR  improved. -EP/cardiology on board -Continue IV Lasix 40 mg twice daily.  Will eventually transition to torsemide. -Monitor fluid status, renal function and electrolytes.  -Cozaar and Aldactone on hold.  LUE edema: Chronic.  [place] negative for DVT in 06/2019. Repeat showed acute superficial vein thrombosis but unclear significance of this.  Acute on chronic blood loss anemia with iron deficiency: Hgb 7.6-stable -Enteroscopy negative. -Received 2 units of packed RBCs over -Transfuse 1 more unit to keep Hgb above 8.0 given history of CAD. -Continue iron supplementations  Permanent A. Fib?  Atrial paced rhythm on EKG.  Not on AC due to GI bleed   CAD/CABG, DES: Stable. -Continue with statins  Essential hypertension: Normotensive. -Cozaar on hold.  CKD stage IIIa: Stable  Hypokalemia: Replete orally  Soft mass upper nasal cavity B/L:  -ENT, Dr. Constance Holster consulted by previous attending recommended outpatient follow-up.  Debility/physical deconditioning -PT/OT  Impaired hearing -Utilizing sign language interpreter   Body mass index is 26.44 kg/m.        DVT prophylaxis:  heparin injection 5,000 Units Start: 09/02/19 1400 Place and maintain sequential compression device Start: 09/02/19 1000 SCDs Start: 09/01/19 1750 Place TED hose Start: 08/30/19 0740  Code Status: Full code Family Communication: Patient and/or RN. Available if any question.  Status is: Inpatient  Remains inpatient appropriate because:IV treatments appropriate due to intensity of illness or inability to take PO and Inpatient level of care appropriate due to severity of illness   Dispo: The patient is from: Home              Anticipated d/c is to: SNF              Anticipated d/c date is: 2 days              Patient currently is not medically stable to d/c.       Consultants:  PCCM Cardiology EP Infectious disease   Sch Meds:  Scheduled Meds: . sodium chloride   Intravenous Once  .  Chlorhexidine Gluconate Cloth  6 each Topical Daily  . ferrous sulfate  325 mg Oral Daily  . furosemide  40 mg Intravenous Q12H  . Gerhardt's butt cream   Topical BID  . heparin injection (subcutaneous)  5,000 Units Subcutaneous Q8H  . multivitamin with minerals  1 tablet Oral Daily  . pantoprazole  40 mg Oral BID  . polyethylene glycol  17 g Oral Daily  . senna  1 tablet Oral Daily  . simvastatin  20 mg Oral q1800  . sodium chloride flush  3 mL Intravenous Q12H   Continuous Infusions: . sodium chloride 10 mL/hr at 08/30/19 0400  . cefTRIAXone (ROCEPHIN) IVPB 2 gram/100 mL NS (Mini-Bag Plus) 2 g (09/05/19 2326)   PRN Meds:.sodium chloride, acetaminophen, acetaminophen, albuterol, dextromethorphan-guaiFENesin, HYDROcodone-acetaminophen, ondansetron (ZOFRAN) IV, sodium chloride flush  Antimicrobials: Anti-infectives (From admission, onward)   Start     Dose/Rate Route Frequency Ordered Stop   09/01/19 1523  ceFAZolin (ANCEF) IVPB 2 g/50 mL premix        over 30 Minutes  Continuous PRN 09/01/19 1523 09/01/19 1520   08/31/19 1930  gentamicin (GARAMYCIN) 80 mg in sodium chloride 0.9 % 500 mL irrigation        80 mg Irrigation On call 08/31/19 1918  09/01/19 1612   08/30/19 0800  ceFAZolin (ANCEF) IVPB 2g/100 mL premix        2 g 200 mL/hr over 30 Minutes Intravenous To [facility] 08/29/19 1911 08/30/19 0856   08/25/19 2215  ceFAZolin (ANCEF) IVPB 1 g/50 mL premix        1 g 100 mL/hr over 30 Minutes Intravenous Every 6 hours 08/25/19 2025 08/26/19 1212   08/25/19 1611  gentamicin (GARAMYCIN) 80 mg in sodium chloride 0.9 % 500 mL irrigation  Status:  Discontinued          As needed 08/25/19 1612 08/25/19 1936   08/25/19 0915  gentamicin (GARAMYCIN) 80 mg in sodium chloride 0.9 % 500 mL irrigation  Status:  Discontinued        80 mg Irrigation On call 08/25/19 0908 08/25/19 2005   08/25/19 0915  ceFAZolin (ANCEF) IVPB 2g/100 mL premix        2 g 200 mL/hr over 30 Minutes  Intravenous On call 08/25/19 0908 08/25/19 1610   08/24/19 0000  cefTRIAXone (ROCEPHIN) 2 g in sodium chloride 0.9 % 100 mL IVPB     Discontinue     2 g 200 mL/hr over 30 Minutes Intravenous Every 24 hours 08/23/19 1725         I have personally reviewed the following labs and images: CBC: Recent Labs  Lab 08/31/19 0225 08/31/19 0225 09/01/19 0341 09/02/19 0550 09/03/19 0400 09/04/19 0403 09/06/19 0441  WBC 6.7   < > 5.6 5.3 5.4 5.2 4.9  NEUTROABS 5.5  --  4.1 4.1 4.1 3.9  --   HGB 8.0*   < > 7.7* 7.6* 7.5* 7.5* 7.6*  HCT 27.4*   < > 26.5* 26.1* 25.3* 25.8* 25.4*  MCV 86.2   < > 87.2 86.1 87.8 87.5 87.0  PLT 245   < > 215 206 199 188 176   < > = values in this interval not displayed.   BMP &GFR Recent Labs  Lab 09/03/19 0400 09/03/19 1430 09/04/19 0403 09/05/19 0824 09/06/19 0441  NA 139 139 141 141 140  K 3.3* 3.6 3.7 3.3* 3.7  CL 101 102 103 102 102  CO2 32 31 32 31 32  GLUCOSE 93 118* 98 125* 89  BUN 12 12 12 12 12   CREATININE 0.86 0.88 0.86 1.01 0.94  CALCIUM 7.7* 7.7* 7.7* 8.0* 7.9*   Estimated Creatinine Clearance: 70.4 mL/min (by C-G formula based on SCr of 0.94 mg/dL). Liver & Pancreas: No results for input(s): AST, ALT, ALKPHOS, BILITOT, PROT, ALBUMIN in the last 168 hours. No results for input(s): LIPASE, AMYLASE in the last 168 hours. No results for input(s): AMMONIA in the last 168 hours. Diabetic: No results for input(s): HGBA1C in the last 72 hours. Recent Labs  Lab 09/02/19 1628 09/03/19 0534 09/04/19 0631 09/05/19 0603 09/06/19 0527  GLUCAP 98 86 92 99 96   Cardiac Enzymes: No results for input(s): CKTOTAL, CKMB, CKMBINDEX, TROPONINI in the last 168 hours. No results for input(s): PROBNP in the last 8760 hours. Coagulation Profile: No results for input(s): INR, PROTIME in the last 168 hours. Thyroid Function Tests: No results for input(s): TSH, T4TOTAL, FREET4, T3FREE, THYROIDAB in the last 72 hours. Lipid Profile: No results for  input(s): CHOL, HDL, LDLCALC, TRIG, CHOLHDL, LDLDIRECT in the last 72 hours. Anemia Panel: No results for input(s): VITAMINB12, FOLATE, FERRITIN, TIBC, IRON, RETICCTPCT in the last 72 hours. Urine analysis:    Component Value Date/Time   COLORURINE YELLOW (A)  08/20/2019 0924   APPEARANCEUR HAZY (A) 08/20/2019 0924   APPEARANCEUR Clear 12/13/2017 0918   LABSPEC 1.015 08/20/2019 0924   LABSPEC 1.021 11/19/2011 1501   PHURINE 5.0 08/20/2019 0924   GLUCOSEU NEGATIVE 08/20/2019 0924   GLUCOSEU Negative 11/19/2011 1501   HGBUR NEGATIVE 08/20/2019 0924   BILIRUBINUR NEGATIVE 08/20/2019 0924   BILIRUBINUR Negative 12/13/2017 0918   BILIRUBINUR Negative 11/19/2011 1501   KETONESUR NEGATIVE 08/20/2019 0924   PROTEINUR NEGATIVE 08/20/2019 0924   NITRITE NEGATIVE 08/20/2019 0924   LEUKOCYTESUR NEGATIVE 08/20/2019 0924   LEUKOCYTESUR Negative 11/19/2011 1501   Sepsis Labs: Invalid input(s): PROCALCITONIN, [place]  Microbiology: Recent Results (from the past 240 hour(s))  Surgical PCR screen     Status: None   Collection Time: 08/31/19  2:20 PM   Specimen: Nasal Mucosa; Nasal Swab  Result Value Ref Range Status   MRSA, PCR NEGATIVE NEGATIVE Final   Staphylococcus aureus NEGATIVE NEGATIVE Final    Comment: (NOTE) The Xpert SA Assay (FDA approved for NASAL specimens in patients 24 years of age and older), is one component of a comprehensive surveillance program. It is not intended to diagnose infection nor to guide or monitor treatment. Performed at [facility], [place] [address], [place], [place] [address]     Radiology Studies: DG CHEST PORT 1 VIEW  Result Date: 09/06/2019 CLINICAL DATA:  Shortness of breath. Chronic bilateral pleural effusions. EXAM: PORTABLE CHEST 1 VIEW COMPARISON:  09/04/2019 and 09/03/2019 FINDINGS: There has been improvement in the haziness at both lung bases, probably representing a combination of decreasing effusions and improved mild interstitial  edema. Chronic cardiomegaly. Chronic pulmonary vascular congestion. AICD in place. Aortic atherosclerosis. CABG. IMPRESSION: Improving bilateral pleural effusions and interstitial edema. Electronically Signed   By: Lorriane Shire M.D.   On: 09/06/2019 09:41   45 minutes with more than 50% spent in reviewing records, counseling patient/family and coordinating care.   Tyee Vandevoorde T. [place]  If 7PM-7AM, please contact night-coverage [email or web address] Password [facility] 09/06/2019, 12:07 PM

## 2019-09-06 NOTE — Plan of Care (Signed)
  Problem: Education: Goal: Knowledge of General Education information will improve Description: Including pain rating scale, medication(s)/side effects and non-pharmacologic comfort measures Outcome: Progressing   Problem: Health Behavior/Discharge Planning: Goal: Ability to manage health-related needs will improve Outcome: Progressing   Problem: Clinical Measurements: Goal: Ability to maintain clinical measurements within normal limits will improve Outcome: Progressing Goal: Will remain free from infection Outcome: Progressing Goal: Diagnostic test results will improve Outcome: Progressing Goal: Respiratory complications will improve Outcome: Progressing Goal: Cardiovascular complication will be avoided Outcome: Progressing   Problem: Activity: Goal: Risk for activity intolerance will decrease Outcome: Progressing   Problem: Nutrition: Goal: Adequate nutrition will be maintained Outcome: Progressing   Problem: Coping: Goal: Level of anxiety will decrease Outcome: Progressing   Problem: Elimination: Goal: Will not experience complications related to bowel motility Outcome: Progressing Goal: Will not experience complications related to urinary retention Outcome: Progressing   Problem: Pain Managment: Goal: General experience of comfort will improve Outcome: Progressing   Problem: Safety: Goal: Ability to remain free from injury will improve Outcome: Progressing   Problem: Skin Integrity: Goal: Risk for impaired skin integrity will decrease Outcome: Progressing   Problem: Cardiovascular: Goal: Ability to achieve and maintain adequate cardiovascular perfusion will improve Outcome: Progressing Goal: Vascular access site(s) Level 0-1 will be maintained Outcome: Progressing   Problem: Education: Goal: Knowledge of cardiac device and self-care will improve Outcome: Progressing Goal: Ability to safely manage health related needs after discharge will  improve Outcome: Progressing Goal: Individualized Educational Video(s) Outcome: Progressing   Problem: Cardiac: Goal: Ability to achieve and maintain adequate cardiopulmonary perfusion will improve Outcome: Progressing

## 2019-09-07 LAB — RENAL FUNCTION PANEL
Albumin: 1.4 g/dL — ABNORMAL LOW (ref 3.5–5.0)
Anion gap: 6 (ref 5–15)
BUN: 13 mg/dL (ref 8–23)
CO2: 31 mmol/L (ref 22–32)
Calcium: 7.9 mg/dL — ABNORMAL LOW (ref 8.9–10.3)
Chloride: 102 mmol/L (ref 98–111)
Creatinine, Ser: 0.89 mg/dL (ref 0.61–1.24)
GFR calc Af Amer: >60 mL/min (ref >60)
GFR calc non Af Amer: >60 mL/min (ref >60)
Glucose, Bld: 93 mg/dL (ref 70–99)
Phosphorus: 3.1 mg/dL (ref 2.5–4.6)
Potassium: 3.7 mmol/L (ref 3.5–5.1)
Sodium: 139 mmol/L (ref 135–145)

## 2019-09-07 LAB — GLUCOSE, CAPILLARY: Glucose-Capillary: 84 mg/dL (ref 70–99)

## 2019-09-07 LAB — CBC
HCT: 26.3 % — ABNORMAL LOW (ref 39.0–52.0)
Hemoglobin: 7.9 g/dL — ABNORMAL LOW (ref 13.0–17.0)
MCH: 25.8 pg — ABNORMAL LOW (ref 26.0–34.0)
MCHC: 30 g/dL (ref 30.0–36.0)
MCV: 85.9 fL (ref 80.0–100.0)
Platelets: 186 10*3/uL (ref 150–400)
RBC: 3.06 MIL/uL — ABNORMAL LOW (ref 4.22–5.81)
RDW: 20.9 % — ABNORMAL HIGH (ref 11.5–15.5)
WBC: 4.8 10*3/uL (ref 4.0–10.5)
nRBC: 0 % (ref 0.0–0.2)

## 2019-09-07 LAB — MAGNESIUM: Magnesium: 1.8 mg/dL (ref 1.7–2.4)

## 2019-09-07 MED ORDER — MAGNESIUM SULFATE 2 GM/50ML IV SOLN
2.0000 g | Freq: Once | INTRAVENOUS | Status: AC
  Administered 2019-09-07: 2 g via INTRAVENOUS
  Filled 2019-09-07: qty 50

## 2019-09-07 MED ORDER — TORSEMIDE 20 MG PO TABS
40.0000 mg | ORAL_TABLET | Freq: Two times a day (BID) | ORAL | Status: DC
  Administered 2019-09-07 – 2019-09-12 (×11): 40 mg via ORAL
  Filled 2019-09-07 (×11): qty 2

## 2019-09-07 MED ORDER — POTASSIUM CHLORIDE CRYS ER 20 MEQ PO TBCR
40.0000 meq | EXTENDED_RELEASE_TABLET | Freq: Once | ORAL | Status: AC
  Administered 2019-09-07: 40 meq via ORAL
  Filled 2019-09-07: qty 2

## 2019-09-07 NOTE — Telephone Encounter (Signed)
Pls call and schedule appt in 2 weeks for hosp f/u

## 2019-09-07 NOTE — TOC Initial Note (Addendum)
Transition of Care [facility]) - Initial/Assessment Note    Patient Details  Name: Thomas [name] MRN: [id number] Date of Birth: 1934-05-01  Transition of Care [facility]) CM/SW Contact:    Trula Ore, Ward Phone Number: 09/07/2019, 5:18 PM  Clinical Narrative:               CSW spoke with patient and patients spouse at bedside with sign language interpretor. Patients spouse agreed to SNF placement for patient near [place] area. Patients spouse asked CSW for help with food. CSW provided patients spouse with food voucher to [employer] as well as food resources for [employer] area. Patients spouse [name] would like CSW that works tomorrow 6/25 to call her at 10:00am with SNF facility choices.  Pending bed offers. CSW will need to start insurance authorization.  TOC team will continue to follow.  Expected Discharge Plan: Skilled Nursing Facility Barriers to Discharge: Continued Medical Work up   Patient Goals and CMS Choice Patient states their goals for this hospitalization and ongoing recovery are:: to go to SNF CMS Medicare.gov Compare Post Acute Care list provided to:: Patient Choice offered to / list presented to : Patient  Expected Discharge Plan and Services Expected Discharge Plan: [place]       Living arrangements for the past 2 months: Single Family Home                                      Prior Living Arrangements/Services Living arrangements for the past 2 months: Single Family Home Lives with:: Self, Spouse Patient language and need for interpreter reviewed:: Yes Do you feel safe going back to the place where you live?: No   SNF  Need for Family Participation in Patient Care: Yes (Comment) Care giver support system in place?: Yes (comment)   Criminal Activity/Legal Involvement Pertinent to Current Situation/Hospitalization: No - Comment as needed  Activities of Daily Living Home Assistive  Devices/Equipment: None ADL Screening (condition at time of admission) Patient's cognitive ability adequate to safely complete daily activities?: No Is the patient deaf or have difficulty hearing?: Yes (ASL and written communication as patient is deaf) Does the patient have difficulty seeing, even when wearing glasses/contacts?: No Does the patient have difficulty concentrating, remembering, or making decisions?: No Patient able to express need for assistance with ADLs?: Yes Does the patient have difficulty dressing or bathing?: No Independently performs ADLs?: Yes (appropriate for developmental age) Does the patient have difficulty walking or climbing stairs?: No Weakness of Legs: None Weakness of Arms/Hands: None  Permission Sought/Granted Permission sought to share information with : Case Manager, Family Supports, Chartered certified accountant granted to share information with : Yes, Verbal Permission Granted  Share Information with NAME: [name]  Permission granted to share info w AGENCY: SNF  Permission granted to share info w Relationship: Spouse  Permission granted to share info w Contact Information: [name] [phone]  Emotional Assessment Appearance:: Appears stated age Attitude/Demeanor/Rapport: Gracious Affect (typically observed): Calm Orientation: : Oriented to Self, Oriented to Place, Oriented to  Time, Oriented to Situation Alcohol / Substance Use: Not Applicable Psych Involvement: No (comment)  Admission diagnosis:  Bacteremia due to group B Streptococcus [R78.81, B95.1] Patient Active Problem List   Diagnosis Date Noted  . 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 [place] intertrigo 08/24/2019  . Edema of left upper extremity 08/24/2019  . Pneumonia of left lower  lobe due to group B Streptococcus ([employer]) 08/24/2019  . Heart block AV complete ([place]) 08/23/2019  . Sepsis due to group B Streptococcus ([place]) 08/20/2019  . Acute blood loss anemia  08/20/2019  . HCAP (healthcare-associated pneumonia) 08/18/2019  . Severe sepsis with septic shock ([place]) 08/18/2019  . Acute renal failure superimposed on stage 3a chronic kidney disease ([place]) 08/18/2019  . Black stool 08/18/2019  . Iron deficiency anemia 08/18/2019  . Group B streptococcal bacteriuria 06/18/2019  . Lactic acidosis 06/18/2019  . Pressure ulcer, stage 2 ([employer]) 06/18/2019  . Bacteremia due to group B Streptococcus 06/17/2019  . SIRS (systemic inflammatory response syndrome) ([place]) 06/16/2019  . Leg swelling   . Decompensated hepatic cirrhosis ([place]) 05/22/2019  . Anasarca 05/19/2019  . CRI (chronic renal insufficiency), stage 3 (moderate) 01/30/2019  . Iron deficiency anemia due to chronic blood loss 11/02/2018  . Coronary artery disease of bypass graft of native heart with stable angina pectoris ([place]) 11/02/2018  . Pure hypercholesterolemia 11/02/2018  . Biventricular automatic implantable cardioverter defibrillator in situ 11/02/2018  . GIB (gastrointestinal bleeding) 09/14/2018  . Hypotension 08/14/2018  . Chronic systolic CHF (congestive heart failure) ([place]) 08/12/2018  . Acute on chronic combined systolic and diastolic CHF (congestive heart failure) ([place])   . GI bleed 05/26/2018  . Occult GI bleeding 05/25/2018  . Normocytic anemia 05/25/2018  . Elevated troponin 05/25/2018  . Lymphedema 02/28/2018  . Weakness 07/15/2016  . Fatigue 07/15/2016  . Stroke (cerebrum) ([place]) 10/31/2015  . Bulbous urethral stricture 09/18/2015  . Bilateral deafness 08/19/2015  . Bilateral cataracts 08/19/2015  . Acid reflux 08/19/2015  . Mixed hyperlipidemia 08/19/2015  . Essential hypertension 08/19/2015  . Myocardial infarction ([place]) 08/19/2015  . Calculus of kidney 08/19/2015  . Pacemaker 08/19/2015  . Gastroduodenal ulcer 08/19/2015  . Dupuytren's contracture of foot 08/19/2015  . Malignant neoplasm of prostate ([place]) 08/19/2015  . Microhematuria 08/19/2015  . CAD S/P multiple  PCIs 02/22/2015  . Status post coronary artery bypass grafting 02/22/2015  . Benign essential HTN 04/02/2014  . Hematochezia 02/20/2014  . Tricuspid valve regurgitation, nonrheumatic 02/20/2014  . Mobitz type II atrioventricular block 12/12/2013  . Long term current use of anticoagulant 10/18/2013  . Cardiomyopathy, ischemic 11/11/2012  . Permanent atrial fibrillation ([place]) 11/11/2012  . Biventricular ICD in place 11/11/2012  . Chronic combined systolic and diastolic heart failure ([place]) 11/11/2012   PCP:  [name], MD Pharmacy:   [id number] - [place], [place] [address] [place] [address] Phone: [phone] Fax: [phone]     Social Determinants of Health (SDOH) Interventions    Readmission Risk Interventions Readmission Risk Prevention Plan 08/21/2019 06/23/2019 06/20/2019  Transportation Screening Complete Complete Complete  PCP or Specialist Appt within 3-5 Days - Complete -  HRI or [place] - Complete Complete  Social Work Consult for [place] Planning/Counseling - Complete -  Palliative Care Screening - Not Applicable Not Applicable  Medication Review Press photographer) Complete Complete Complete  PCP or Specialist appointment within 3-5 days of discharge Complete - -  [place] or Home Care Consult Complete - -  [place] Complete - -  Some recent data might be hidden

## 2019-09-07 NOTE — Progress Notes (Signed)
Physical Therapy Treatment Patient Details Name: Thomas [name] MRN: [id number] DOB: Dec 24, 1934 Today's Date: 09/07/2019    History of Present Illness 85 yo deaf male admitted to [facility] 6/4 with weakness, sepsis due to LLL PNA with recommendation for transfer to [facility] for ICD removal due to group b strep. ICD removal on 6/11 with temp pacer placed. Multiple tooth extractions 6/16. Pacer implant 6/18. PMHx: CAD, ICD, HF, CKD, AFib, prostate CA, sepsis, CABG, ICM    PT Comments    Pt making slow progress. Continues to have poor functional activity tolerance. Continue to recommend SNF.    Follow Up Recommendations  SNF;Supervision/Assistance - 24 hour     Equipment Recommendations  Other (comment) (rollator)    Recommendations for Other Services       Precautions / Restrictions Precautions Precautions: ICD/Pacemaker;Fall Precaution Comments: for RUE. LUE had pacer removed    Mobility  Bed Mobility Overal bed mobility: Needs Assistance Bed Mobility: Supine to Sit     Supine to sit: Min assist;HOB elevated     General bed mobility comments: Assist to elevate trunk into sitting  Transfers Overall transfer level: Needs assistance Equipment used: 4-wheeled walker Transfers: Sit to/from [facility] Sit to Stand: Mod assist;Min assist Stand pivot transfers: Min assist       General transfer comment: Assist to bring hips up and for balance. min assist from elevated bed. Mod assist from bsc. Bed to bsc to bed with rollator and assist for balance  Ambulation/Gait Ambulation/Gait assistance: Min assist Gait Distance (Feet): 50 Feet Assistive device: 4-wheeled walker Gait Pattern/deviations: Step-through pattern;Decreased stride length;Trunk flexed Gait velocity: decr   General Gait Details: Assist for balance and support. Pt fatigues quickly. Amb on 2L of O2 with SpO2 91% once sitting in room. Unable to get SpO2 reading during amb   Stairs              Wheelchair Mobility    Modified Rankin (Stroke Patients Only)       Balance Overall balance assessment: Needs assistance Sitting-balance support: No upper extremity supported;Feet supported Sitting balance-Leahy Scale: Fair     Standing balance support: Bilateral upper extremity supported;During functional activity Standing balance-Leahy Scale: Poor Standing balance comment: rollator and min guard for static standing                            Cognition Arousal/Alertness: Awake/alert Behavior During Therapy: WFL for tasks assessed/performed Overall Cognitive Status: Within Functional Limits for tasks assessed                                        Exercises      General Comments        Pertinent Vitals/Pain Pain Assessment: Faces Faces Pain Scale: Hurts a little bit Pain Location: bil knees Pain Descriptors / Indicators: Sore Pain Intervention(s): Limited activity within patient's tolerance    Home Living                      Prior Function            PT Goals (current goals can now be found in the care plan section) Acute Rehab PT Goals Patient Stated Goal: to go home Progress towards PT goals: Progressing toward goals    Frequency    Min 3X/week      PT  Plan Current plan remains appropriate    Co-evaluation              AM-PAC PT "6 Clicks" Mobility   Outcome Measure  Help needed turning from your back to your side while in a flat bed without using bedrails?: A Little Help needed moving from lying on your back to sitting on the side of a flat bed without using bedrails?: A Little Help needed moving to and from a bed to a chair (including a wheelchair)?: A Little Help needed standing up from a chair using your arms (e.g., wheelchair or bedside chair)?: A Lot Help needed to walk in hospital room?: A Little Help needed climbing 3-5 steps with a railing? : Total 6 Click Score: 15    End of Session  Equipment Utilized During Treatment: Gait belt Activity Tolerance: Patient limited by fatigue Patient left: with call bell/phone within reach;in bed;Other (comment) (with OT present) Nurse Communication: Mobility status PT Visit Diagnosis: History of falling (Z91.81);Difficulty in walking, not elsewhere classified (R26.2);Muscle weakness (generalized) (M62.81);Unsteadiness on feet (R26.81)     Time: 1030-1105 PT Time Calculation (min) (ACUTE ONLY): 35 min  Charges:  $Gait Training: 23-37 mins                     [place] Pager [phone] Office [place] 09/07/2019, 12:45 PM

## 2019-09-07 NOTE — Progress Notes (Signed)
Occupational Therapy Treatment Patient Details Name: Thomas [name] MRN: [id number] DOB: February 27, 1935 Today's Date: 09/07/2019    History of present illness 85 yo deaf male admitted to [facility] 6/4 with weakness, sepsis due to LLL PNA with recommendation for transfer to [facility] for ICD removal due to group b strep. ICD removal on 6/11 with temp pacer placed. Multiple tooth extractions 6/16. Pacer implant 6/18. PMHx: CAD, ICD, HF, CKD, AFib, prostate CA, sepsis, CABG, ICM   OT comments  Pt progressing toward established OT goals. In-person sign language interpreter utilized throughout session. Pt requires minA+2 to stand from [facility] and assistance from therapist for posterior care. Pt attempted to have BM, but reports pain, RN notified. Pt required minA for stand pivot and functional mobility at rollator level. Pt on 2-3lnc throughout session, SpO2 >90%, HR stable throughout 70s bpm. Pt will continue to benefit from skilled OT services to maximize safety and independence with ADL/IADL and functional mobility. Will continue to follow acutely and progress as tolerated.    Follow Up Recommendations  SNF;Supervision/Assistance - 24 hour    Equipment Recommendations  3 in 1 bedside commode    Recommendations for Other Services      Precautions / Restrictions Precautions Precautions: ICD/Pacemaker;Fall Precaution Comments:  LUE had pacer removed;right-sided BiV-ICD insertion CRT-D on 6/18       Mobility Bed Mobility Overal bed mobility: Needs Assistance Bed Mobility: Sit to Supine     Supine to sit: Min assist;HOB elevated Sit to supine: Min assist   General bed mobility comments: minA for BLE management  Transfers Overall transfer level: Needs assistance Equipment used: 4-wheeled walker Transfers: Sit to/from [facility] Sit to Stand: Min assist;Mod assist;+2 safety/equipment Stand pivot transfers: Min assist       General transfer comment: minA+2 to stand from [facility], minA  to stand from elevated surface;stand pivot minA for stability and safety    Balance Overall balance assessment: Needs assistance Sitting-balance support: No upper extremity supported;Feet supported Sitting balance-Leahy Scale: Fair     Standing balance support: Bilateral upper extremity supported;During functional activity Standing balance-Leahy Scale: Poor Standing balance comment: rollator and min guard for static standing                           ADL either performed or assessed with clinical judgement   ADL Overall ADL's : Needs assistance/impaired Eating/Feeding: Set up;Sitting Eating/Feeding Details (indicate cue type and reason): setup for lunch with bed in chair position Grooming: Set up;Sitting;Wash/dry face                   Toilet Transfer: Minimal assistance;Stand-pivot;BSC Toilet Transfer Details (indicate cue type and reason): stand pivot to [facility] minA to powerup from elevated surface;modA to powerup from [facility] Toileting- Clothing Manipulation and Hygiene: Sit to/from stand;Moderate assistance Toileting - Clothing Manipulation Details (indicate cue type and reason): pt reports pain with attempt to have BM;required minA+2 to stand from BSC;assist for posterior care     Functional mobility during ADLs: Minimal assistance (rollator) General ADL Comments: pt with decreased activity tolerance and decreased endurance;on 2-3lnc during functional mobility and ADL, SpO2 >90% throughout     Vision       Perception     Praxis      Cognition Arousal/Alertness: Awake/alert Behavior During Therapy: WFL for tasks assessed/performed Overall Cognitive Status: Within Functional Limits for tasks assessed Area of Impairment: Safety/judgement  Safety/Judgement: Decreased awareness of deficits              Exercises Exercises: Other exercises Other Exercises Other Exercises: elbow, wrist, and digit ROM x10 BUE while  seated   Shoulder Instructions       General Comments HR stable and within normal range throughout    Pertinent Vitals/ Pain       Pain Assessment: Faces Faces Pain Scale: Hurts a little bit Pain Location: bil knees Pain Descriptors / Indicators: Sore Pain Intervention(s): Limited activity within patient's tolerance;Monitored during session;Repositioned  Home Living                                          Prior Functioning/Environment              Frequency  Min 2X/week        Progress Toward Goals  OT Goals(current goals can now be found in the care plan section)  Progress towards OT goals: Progressing toward goals  Acute Rehab OT Goals Patient Stated Goal: to go home OT Goal Formulation: With patient Time For Goal Achievement: 09/21/19 Potential to Achieve Goals: Good ADL Goals Pt Will Perform Grooming: with modified independence;standing Pt Will Perform Lower Body Dressing: with set-up;with supervision;sit to/from stand Pt Will Transfer to Toilet: with set-up;with supervision;bedside commode;ambulating Pt Will Perform Toileting - Clothing Manipulation and hygiene: with set-up;with supervision;sit to/from stand Pt/caregiver will Perform Home Exercise Program: Both right and left upper extremity;With theraband;With Supervision;With written HEP provided  Plan Discharge plan remains appropriate    Co-evaluation                 AM-PAC OT "6 Clicks" Daily Activity     Outcome Measure   Help from another person eating meals?: A Little Help from another person taking care of personal grooming?: A Little Help from another person toileting, which includes using toliet, bedpan, or urinal?: A Lot Help from another person bathing (including washing, rinsing, drying)?: A Lot Help from another person to put on and taking off regular upper body clothing?: A Little Help from another person to put on and taking off regular lower body clothing?: A  Lot 6 Click Score: 15    End of Session Equipment Utilized During Treatment: Gait belt (rollator)  OT Visit Diagnosis: Repeated falls (R29.6);Muscle weakness (generalized) (M62.81)   Activity Tolerance Patient tolerated treatment well   Patient Left in bed;with call bell/phone within reach;with bed alarm set   Nurse Communication Mobility status        Time: 0160-1093 OT Time Calculation (min): 27 min  Charges: OT General Charges $OT Visit: 1 Visit OT Treatments $Self Care/Home Management : 23-37 mins  Helene Kelp OTR/L Acute Rehabilitation Services Office: [place] 09/07/2019, 1:57 PM

## 2019-09-07 NOTE — Progress Notes (Signed)
PROGRESS NOTE  Thomas [name] OJJ:[id number] DOB: Sep 28, 1934   PCP: [name], MD  Patient is from: Home  DOA: 08/24/2019 LOS: 44  Brief Narrative / Interim history: 85 year old male with impaired hearing, CAD, high-grade AVB s/p BiV-ICD, systolic CHF with recovered EF, CKD-3A, A. fib not on AC due to GI bleed/duodenal AVM, lymphedema, prostate cancer and recent group B strep bacteremia s/p 6 weeks of antibiotics admitted to [facility] after presenting with weakness and presyncope and found to have sepsis secondary to LLL pneumonia and recurrent group B strep bacteremia.  Heart TEE without evidence of vegetation on 08/23/2019.  ID at [facility] recommended transfer to [facility] for ICD removal given recurrent bacteremia.  Patient underwent extraction of BiV-ICD system and insertion of new temporary transvenous pacemaker on 08/25/2019.  He had right-sided BiV-ICD insertion CRT-D on 6/18.  He also underwent dental extraction involving tooth #2, 28 and 31 with 2 quadrants of alveoloplasty.  Hospital course complicated by anemia requiring 2 units of blood transfusions.  He had small bowel enteroscopy without significant pathology.  Subjective: Seen and examined earlier this morning.  No major events overnight or this morning.  No complaint this morning.  Denies chest pain, dyspnea, GI or UTI symptoms.  Reports dark looking stool but denies hematochezia.  Video sign language interpreter with ID number [id number] used for this encounter.  Objective: Vitals:   09/06/19 2245 09/07/19 0322 09/07/19 0839 09/07/19 1300  BP: 132/73 (!) 141/71 (!) 112/35 (!) 125/39  Pulse: 70 69 67 69  Resp: 19 18 20 17   Temp: 98.6 F (37 C) 98.2 F (36.8 C) 98.2 F (36.8 C) 98.4 F (36.9 C)  TempSrc: Oral Oral Oral Oral  SpO2: 97% 98% 98% 97%  Weight:  95.5 kg    Height:        Intake/Output Summary (Last 24 hours) at 09/07/2019 1432 Last data filed at 09/07/2019 0800 Gross per 24 hour  Intake 758 ml  Output 0 ml    Net 758 ml   Filed Weights   09/05/19 0520 09/06/19 0243 09/07/19 0322  Weight: 96.5 kg 93.4 kg 95.5 kg    Examination:  GENERAL: No apparent distress.  Nontoxic. HEENT: MMM.  Vision grossly intact.  Impaired hearing. NECK: Supple.  No apparent JVD.  RESP: 98% on 2 L.  No IWOB.  Fair aeration bilaterally. CVS:  RRR. Heart sounds normal.  ABD/GI/GU: BS+. Abd soft, NTND.  MSK/EXT:  Moves extremities. No apparent deformity.  Significant lymphedema in BLE and LUE. SKIN: Dressing over right and left upper chest DCI. NEURO: Awake, alert and oriented appropriately.  No apparent focal neuro deficit. PSYCH: Calm. Normal affect.  Procedures:  6/7-small bowel enteroscopy without significant finding  6/8-TEE without vegetation  6/11-ICD extraction  6/16-dental extraction including tooth #2, 28, 31, and 2 quadrants of alveoloplasty  6/18-right-sided BiV-ICD insertion CRT-D on 6/18.  Microbiology summarized: 6/11-MRSA PCR negative 6/12-blood cultures NGTD 6/17-MRSA PCR negative  Assessment & Plan: Septic shock likely secondary to group B streptococcus bacteremia-unclear source of bacteremia. -TEE without evidence of vegetation. -Blood cultures on 6/12 NGTD. -Hardware extraction and implantation as above. -IV ceftriaxone through 09/22/2019 per ID recommendation.  Chronic apical periodontitis/dental caries:  -s/p multiple dental extraction, 2 quadrants of alveoloplasty and gross debridement -Continue oral care with mouthwash  Acute respiratory failure with hypoxia-multifactorial including pneumonia, pleural effusion and CHF exacerbation.  Improving. -PCCM signed off. -Treat treatable causes as below. -Wean oxygen as able -OOB/PT/OT  Bilateral pneumonia: CXR with RUL and  RML complete opacification.  Some overlap of pulmonary edema -On ceftriaxone, IV diuretics, saline nebulizer, vest and bronchodilators.  Acute on chronic systolic heart failure with recovered EF to 55%.   Patient with significant lymphedema likely chronic.  On IV Lasix 40 mg twice daily.  I&O incomplete.  Renal function stable.  CXR improved. -EP/cardiology on board -Transition to p.o. torsemide 40 mg twice daily starting this p.m. -Monitor fluid status, renal function and electrolytes.  -Cozaar and Aldactone on hold.  LUE edema: Chronic.  [place] negative for DVT in 06/2019. Repeat showed acute superficial vein thrombosis but unclear significance of this.  Acute on chronic blood loss anemia with iron deficiency: Baseline Hgb 7-8> 7.4 (admit)> 6.7>2u> 8.5>7.6>1u>7.9.  Iron sat 7%.  TIBC 155.  Reports dark stool but he is on iron supplementations. -Enteroscopy negative.  Received 3 units so far. -Continue iron supplementations -Continue monitoring  Permanent A. Fib?  Atrial paced rhythm on EKG.  Not on AC due to GI bleed   CAD/CABG, DES: Stable. -Continue with statins  Essential hypertension: Normotensive. -Cozaar on hold.  CKD stage IIIa: Stable  Hypokalemia: Replete orally  Soft mass upper nasal cavity B/L:  -ENT, Dr. Constance Holster consulted by previous attending recommended outpatient follow-up.  Debility/physical deconditioning -OOB/PT/OT-  Impaired hearing -Utilizing sign language interpreter  Hypoalbuminemia: Albumin 1.4. -Check prealbumin -Consult dietitian Body mass index is 27.03 kg/m.        DVT prophylaxis:  heparin injection 5,000 Units Start: 09/02/19 1400 Place and maintain sequential compression device Start: 09/02/19 1000 SCDs Start: 09/01/19 1750 Place TED hose Start: 08/30/19 0740  Code Status: Full code Family Communication: Patient and/or RN. Available if any question.  Status is: Inpatient  Remains inpatient appropriate because:Unsafe d/c plan, Inpatient level of care appropriate due to severity of illness and Stable hemoglobin   Dispo: The patient is from: Home              Anticipated d/c is to: SNF              Anticipated d/c date is: 1 day               Patient currently is not medically stable to d/c.       Consultants:  PCCM Cardiology EP Infectious disease   Sch Meds:  Scheduled Meds: . sodium chloride   Intravenous Once  . Chlorhexidine Gluconate Cloth  6 each Topical Daily  . ferrous sulfate  325 mg Oral Daily  . furosemide  40 mg Intravenous Q12H  . Gerhardt's butt cream   Topical BID  . heparin injection (subcutaneous)  5,000 Units Subcutaneous Q8H  . multivitamin with minerals  1 tablet Oral Daily  . pantoprazole  40 mg Oral BID  . polyethylene glycol  17 g Oral Daily  . senna  1 tablet Oral Daily  . simvastatin  20 mg Oral q1800  . sodium chloride flush  3 mL Intravenous Q12H   Continuous Infusions: . sodium chloride 10 mL/hr at 08/30/19 0400  . cefTRIAXone (ROCEPHIN) IVPB 2 gram/100 mL NS (Mini-Bag Plus) Stopped (09/06/19 2337)  . magnesium sulfate bolus IVPB 2 g (09/07/19 1358)   PRN Meds:.sodium chloride, acetaminophen, acetaminophen, albuterol, dextromethorphan-guaiFENesin, HYDROcodone-acetaminophen, ondansetron (ZOFRAN) IV, sodium chloride flush  Antimicrobials: Anti-infectives (From admission, onward)   Start     Dose/Rate Route Frequency Ordered Stop   09/01/19 1523  ceFAZolin (ANCEF) IVPB 2 g/50 mL premix        over 30 Minutes  Continuous PRN 09/01/19  1523 09/01/19 1520   08/31/19 1930  gentamicin (GARAMYCIN) 80 mg in sodium chloride 0.9 % 500 mL irrigation        80 mg Irrigation On call 08/31/19 1918 09/01/19 1612   08/30/19 0800  ceFAZolin (ANCEF) IVPB 2g/100 mL premix        2 g 200 mL/hr over 30 Minutes Intravenous To [facility] 08/29/19 1911 08/30/19 0856   08/25/19 2215  ceFAZolin (ANCEF) IVPB 1 g/50 mL premix        1 g 100 mL/hr over 30 Minutes Intravenous Every 6 hours 08/25/19 2025 08/26/19 1212   08/25/19 1611  gentamicin (GARAMYCIN) 80 mg in sodium chloride 0.9 % 500 mL irrigation  Status:  Discontinued          As needed 08/25/19 1612 08/25/19 1936   08/25/19 0915   gentamicin (GARAMYCIN) 80 mg in sodium chloride 0.9 % 500 mL irrigation  Status:  Discontinued        80 mg Irrigation On call 08/25/19 0908 08/25/19 2005   08/25/19 0915  ceFAZolin (ANCEF) IVPB 2g/100 mL premix        2 g 200 mL/hr over 30 Minutes Intravenous On call 08/25/19 0908 08/25/19 1610   08/24/19 0000  cefTRIAXone (ROCEPHIN) 2 g in sodium chloride 0.9 % 100 mL IVPB     Discontinue     2 g 200 mL/hr over 30 Minutes Intravenous Every 24 hours 08/23/19 1725         I have personally reviewed the following labs and images: CBC: Recent Labs  Lab 09/01/19 0341 09/01/19 0341 09/02/19 0550 09/03/19 0400 09/04/19 0403 09/06/19 0441 09/07/19 0134  WBC 5.6   < > 5.3 5.4 5.2 4.9 4.8  NEUTROABS 4.1  --  4.1 4.1 3.9  --   --   HGB 7.7*   < > 7.6* 7.5* 7.5* 7.6* 7.9*  HCT 26.5*   < > 26.1* 25.3* 25.8* 25.4* 26.3*  MCV 87.2   < > 86.1 87.8 87.5 87.0 85.9  PLT 215   < > 206 199 188 176 186   < > = values in this interval not displayed.   BMP &GFR Recent Labs  Lab 09/03/19 1430 09/04/19 0403 09/05/19 0824 09/06/19 0441 09/07/19 0134  NA 139 141 141 140 139  K 3.6 3.7 3.3* 3.7 3.7  CL 102 103 102 102 102  CO2 31 32 31 32 31  GLUCOSE 118* 98 125* 89 93  BUN 12 12 12 12 13   CREATININE 0.88 0.86 1.01 0.94 0.89  CALCIUM 7.7* 7.7* 8.0* 7.9* 7.9*  MG  --   --   --   --  1.8  PHOS  --   --   --   --  3.1   Estimated Creatinine Clearance: 74.4 mL/min (by C-G formula based on SCr of 0.89 mg/dL). Liver & Pancreas: Recent Labs  Lab 09/07/19 0134  ALBUMIN 1.4*   No results for input(s): LIPASE, AMYLASE in the last 168 hours. No results for input(s): AMMONIA in the last 168 hours. Diabetic: No results for input(s): HGBA1C in the last 72 hours. Recent Labs  Lab 09/03/19 0534 09/04/19 0631 09/05/19 0603 09/06/19 0527 09/07/19 0458  GLUCAP 86 92 99 96 84   Cardiac Enzymes: No results for input(s): CKTOTAL, CKMB, CKMBINDEX, TROPONINI in the last 168 hours. No results for  input(s): PROBNP in the last 8760 hours. Coagulation Profile: No results for input(s): INR, PROTIME in the last 168 hours. Thyroid Function  Tests: No results for input(s): TSH, T4TOTAL, FREET4, T3FREE, THYROIDAB in the last 72 hours. Lipid Profile: No results for input(s): CHOL, HDL, LDLCALC, TRIG, CHOLHDL, LDLDIRECT in the last 72 hours. Anemia Panel: No results for input(s): VITAMINB12, FOLATE, FERRITIN, TIBC, IRON, RETICCTPCT in the last 72 hours. Urine analysis:    Component Value Date/Time   COLORURINE YELLOW (A) 08/20/2019 0924   APPEARANCEUR HAZY (A) 08/20/2019 0924   APPEARANCEUR Clear 12/13/2017 0918   LABSPEC 1.015 08/20/2019 0924   LABSPEC 1.021 11/19/2011 1501   PHURINE 5.0 08/20/2019 0924   GLUCOSEU NEGATIVE 08/20/2019 0924   GLUCOSEU Negative 11/19/2011 1501   HGBUR NEGATIVE 08/20/2019 0924   BILIRUBINUR NEGATIVE 08/20/2019 0924   BILIRUBINUR Negative 12/13/2017 0918   BILIRUBINUR Negative 11/19/2011 1501   KETONESUR NEGATIVE 08/20/2019 0924   PROTEINUR NEGATIVE 08/20/2019 0924   NITRITE NEGATIVE 08/20/2019 0924   LEUKOCYTESUR NEGATIVE 08/20/2019 0924   LEUKOCYTESUR Negative 11/19/2011 1501   Sepsis Labs: Invalid input(s): PROCALCITONIN, [place]  Microbiology: Recent Results (from the past 240 hour(s))  Surgical PCR screen     Status: None   Collection Time: 08/31/19  2:20 PM   Specimen: Nasal Mucosa; Nasal Swab  Result Value Ref Range Status   MRSA, PCR NEGATIVE NEGATIVE Final   Staphylococcus aureus NEGATIVE NEGATIVE Final    Comment: (NOTE) The Xpert SA Assay (FDA approved for NASAL specimens in patients 59 years of age and older), is one component of a comprehensive surveillance program. It is not intended to diagnose infection nor to guide or monitor treatment. Performed at [facility], [place] [address] [place], [place] [address]     Radiology Studies: No results found.   Kalaysia Demonbreun T. [place]  If 7PM-7AM, please contact  night-coverage [email or web address] Password [facility] 09/07/2019, 2:32 PM

## 2019-09-07 NOTE — Progress Notes (Signed)
Initial Nutrition Assessment  RD working remotely.  DOCUMENTATION CODES:   Not applicable  INTERVENTION:   - Continue MVI with minerals daily  - Ensure Enlive po BID, each supplement provides 350 kcal and 20 grams of protein  - Encourage continued adequate PO intake  NUTRITION DIAGNOSIS:   Increased nutrient needs related to chronic illness, post-op healing as evidenced by estimated needs.  GOAL:   Patient will meet greater than or equal to 90% of their needs  MONITOR:   PO intake, Supplement acceptance, Weight trends, I & O's  REASON FOR ASSESSMENT:   Consult Assessment of nutrition requirement/status  ASSESSMENT:   85 year old male with PMH of cpngenital deafness, CAD s/p CABG in 1986, STEMI in 2000, high grade AV block s/p pacemaker switched to [employer], CHF, atrial fibrillation, GI bleed secondary to duodenal AVM s/p cauterization, lymphedema, CKD stage III, prostate cancer, HTN, HLD, sepsis secondary to group B strep with bacteremia in April 20201 s/p 6 weeks antibiotics, anemia.   6/11 - s/p ICD removal 6/14 - s/p PICC placement 6/16 - s/p multiple tooth extractions, 2 quadrants of alveoloplasty 6/18 - s/p ICD insertion  Pt requires sign language interpreter for communication. Since RD is working remotely, unable to speak with pt to obtain history.  When a patient presents with low albumin, it is likely skewed due to the acute inflammatory response. Unless it is suspected that patient had poor PO intake or malnutrition prior to admission, then RD should not be consulted solely for low albumin. Note that low albumin is no longer used to diagnose malnutrition. [place] uses the malnutrition guidelines published by the American Society for Parenteral and Enteral Nutrition (A.S.P.E.N.) and the Academy of Nutrition and Dietetics (AND).  Reviewed weight history in chart. Pt with a 13 kg weight loss since 11/07/18. This is a 13.6% weight loss in 10 months which is not  significant for timeframe. Of note, pt's current weight is 3.3 kg above his weight from 08/23/19 encounter. Suspect pt's weight has been fluctuating as a result of fluid status given CHF diagnosis. Weight has been fairly stable this admission. However, per RN edema assessment, pt with moderate pitting generalized edema and deep pitting edema to LUE and BLE.  Admit weight: 96 kg Current weight: 95.5 kg  RD will order oral nutrition supplements to aid pt in meeting kcal and protein needs during this admission.  Meal Completion: 50-100% (averaging 90%)  Medications reviewed and include: ferrous sulfate, MVI with minerals, protonix, miralax, senna, torsemide, IV abx  Labs reviewed: hemoglobin 7.9 CBG: 84  NUTRITION - FOCUSED PHYSICAL EXAM:  Unable to complete at this time. RD working remotely.  Diet Order:   Diet Order            Diet Heart Room service appropriate? Yes; Fluid consistency: Thin  Diet effective now                 EDUCATION NEEDS:   No education needs have been identified at this time  Skin:  Skin Assessment: Skin Integrity Issues: Incisions: chest  Last BM:  09/03/19  Height:   Ht Readings from Last 1 Encounters:  08/24/19 6\' 2"  (1.88 m)    Weight:   Wt Readings from Last 1 Encounters:  09/07/19 95.5 kg    Ideal Body Weight:  86.3 kg  BMI:  Body mass index is 27.03 kg/m.  Estimated Nutritional Needs:   Kcal:  2100-2300  Protein:  105-120 grams  Fluid:  >/= 1.8 L  Kate Jablonski Deonne Rooks, MS, RD, LDN Inpatient Clinical Dietitian Pager: [phone] Weekend/After Hours: [phone]  

## 2019-09-08 LAB — PREALBUMIN: Prealbumin: 9.2 mg/dL — ABNORMAL LOW (ref 18–38)

## 2019-09-08 LAB — SARS CORONAVIRUS 2 (TAT 6-24 HRS): SARS Coronavirus 2: NEGATIVE

## 2019-09-08 LAB — BASIC METABOLIC PANEL
Anion gap: 7 (ref 5–15)
BUN: 13 mg/dL (ref 8–23)
CO2: 31 mmol/L (ref 22–32)
Calcium: 7.7 mg/dL — ABNORMAL LOW (ref 8.9–10.3)
Chloride: 99 mmol/L (ref 98–111)
Creatinine, Ser: 1.02 mg/dL (ref 0.61–1.24)
GFR calc Af Amer: >60 mL/min (ref >60)
GFR calc non Af Amer: >60 mL/min (ref >60)
Glucose, Bld: 94 mg/dL (ref 70–99)
Potassium: 3.6 mmol/L (ref 3.5–5.1)
Sodium: 137 mmol/L (ref 135–145)

## 2019-09-08 LAB — GLUCOSE, CAPILLARY: Glucose-Capillary: 86 mg/dL (ref 70–99)

## 2019-09-08 LAB — HEMOGLOBIN AND HEMATOCRIT, BLOOD
HCT: 26.3 % — ABNORMAL LOW (ref 39.0–52.0)
Hemoglobin: 8 g/dL — ABNORMAL LOW (ref 13.0–17.0)

## 2019-09-08 LAB — MAGNESIUM: Magnesium: 1.8 mg/dL (ref 1.7–2.4)

## 2019-09-08 MED ORDER — HYDROCODONE-ACETAMINOPHEN 5-325 MG PO TABS: 1.0000 | ORAL_TABLET | Freq: Four times a day (QID) | ORAL | Status: DC | PRN

## 2019-09-08 MED ORDER — MAGNESIUM SULFATE 2 GM/50ML IV SOLN
2.0000 g | Freq: Once | INTRAVENOUS | Status: AC
  Administered 2019-09-08: 2 g via INTRAVENOUS

## 2019-09-08 MED ORDER — POTASSIUM CHLORIDE CRYS ER 20 MEQ PO TBCR
40.0000 meq | EXTENDED_RELEASE_TABLET | Freq: Once | ORAL | Status: AC
  Administered 2019-09-08: 40 meq via ORAL

## 2019-09-08 NOTE — NC FL2 (Signed)
[place] LEVEL OF CARE SCREENING TOOL     IDENTIFICATION  Patient Name: Thomas [name] Birthdate: [birth year] Sex: male Admission Date (Current Location): 08/24/2019  [facility] and [place] Number:  Herbalist and Address:  [facility] [place], [facility] [place] [address] [place], [place] [address]      Provider Number: [id number]  Attending Physician Name and Address:  Mercy Riding, MD  Relative Name and Phone Number:  [name] [phone]    Current Level of Care: Hospital Recommended Level of Care: [place] Prior Approval Number:    Date Approved/Denied:   PASRR Number: [id number]  Discharge Plan: SNF    Current Diagnoses: Patient Active Problem List   Diagnosis Date Noted  . 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 Candidal intertrigo 08/24/2019  . Edema of left upper extremity 08/24/2019  . Pneumonia of left lower lobe due to group B Streptococcus ([place]) 08/24/2019  . Heart block AV complete ([place]) 08/23/2019  . Sepsis due to group B Streptococcus ([place]) 08/20/2019  . Acute blood loss anemia 08/20/2019  . HCAP (healthcare-associated pneumonia) 08/18/2019  . Severe sepsis with septic shock ([place]) 08/18/2019  . Acute renal failure superimposed on stage 3a chronic kidney disease ([place]) 08/18/2019  . Black stool 08/18/2019  . Iron deficiency anemia 08/18/2019  . Group B streptococcal bacteriuria 06/18/2019  . Lactic acidosis 06/18/2019  . Pressure ulcer, stage 2 ([place]) 06/18/2019  . Bacteremia due to group B Streptococcus 06/17/2019  . SIRS (systemic inflammatory response syndrome) ([place]) 06/16/2019  . Leg swelling   . Decompensated hepatic cirrhosis ([place]) 05/22/2019  . Anasarca 05/19/2019  . CRI (chronic renal insufficiency), stage 3 (moderate) 01/30/2019  . Iron deficiency anemia due to chronic blood loss 11/02/2018  . Coronary artery disease of bypass graft of native heart with stable angina pectoris ([place])  11/02/2018  . Pure hypercholesterolemia 11/02/2018  . Biventricular automatic implantable cardioverter defibrillator in situ 11/02/2018  . GIB (gastrointestinal bleeding) 09/14/2018  . Hypotension 08/14/2018  . Chronic systolic CHF (congestive heart failure) ([place]) 08/12/2018  . Acute on chronic combined systolic and diastolic CHF (congestive heart failure) ([place])   . GI bleed 05/26/2018  . Occult GI bleeding 05/25/2018  . Normocytic anemia 05/25/2018  . Elevated troponin 05/25/2018  . Lymphedema 02/28/2018  . Weakness 07/15/2016  . Fatigue 07/15/2016  . Stroke (cerebrum) ([place]) 10/31/2015  . Bulbous urethral stricture 09/18/2015  . Bilateral deafness 08/19/2015  . Bilateral cataracts 08/19/2015  . Acid reflux 08/19/2015  . Mixed hyperlipidemia 08/19/2015  . Essential hypertension 08/19/2015  . Myocardial infarction ([place]) 08/19/2015  . Calculus of kidney 08/19/2015  . Pacemaker 08/19/2015  . Gastroduodenal ulcer 08/19/2015  . Dupuytren's contracture of foot 08/19/2015  . Malignant neoplasm of prostate ([place]) 08/19/2015  . Microhematuria 08/19/2015  . CAD S/P multiple PCIs 02/22/2015  . Status post coronary artery bypass grafting 02/22/2015  . Benign essential HTN 04/02/2014  . Hematochezia 02/20/2014  . Tricuspid valve regurgitation, nonrheumatic 02/20/2014  . Mobitz type II atrioventricular block 12/12/2013  . Long term current use of anticoagulant 10/18/2013  . Cardiomyopathy, ischemic 11/11/2012  . Permanent atrial fibrillation ([place]) 11/11/2012  . Biventricular ICD in place 11/11/2012  . Chronic combined systolic and diastolic heart failure ([place]) 11/11/2012    Orientation RESPIRATION BLADDER Height & Weight     Self, Time, Situation, Place  Normal Continent Weight: 208 lb 5.4 oz (94.5 kg) Height:  6\' 2"  (188 cm)  BEHAVIORAL SYMPTOMS/MOOD NEUROLOGICAL BOWEL NUTRITION STATUS  Incontinent Diet (see discharge summary)  AMBULATORY STATUS COMMUNICATION OF NEEDS Skin    Extensive Assist Non-Verbally Surgical wounds, Skin abrasions, Other (Comment) (MASD on groin/buttocks; weeping on L arm; skin tear on back; incisions on L/R chest with gauze PRN)                       Personal Care Assistance Level of Assistance  Bathing, Feeding, Dressing Bathing Assistance: Maximum assistance Feeding assistance: Independent (able to feed self; Cardiac diet) Dressing Assistance: Maximum assistance     Functional Limitations Info  Sight, Hearing, Speech Sight Info: Adequate Hearing Info: Impaired (Deaf) Speech Info: Impaired (Uses Sign Language)    [place]  PT (By licensed PT), OT (By licensed OT)     PT Frequency: 5x week OT Frequency: 5x week            Contractures Contractures Info: Not present    Additional Factors Info  Code Status, Allergies Code Status Info: Full Code Allergies Info: Entresto (Sacubitril-valsartan), Phenazopyridine, Ramipril           Current Medications (09/08/2019):  This is the current hospital active medication list Current Facility-Administered Medications  Medication Dose Route Frequency Provider Last Rate Last Admin  . 0.9 %  sodium chloride infusion (Manually program via Guardrails IV Fluids)   Intravenous Once Gonfa, Taye T, MD      . 0.9 %  sodium chloride infusion   Intravenous PRN [name], MD 10 mL/hr at 08/30/19 0400 Rate Verify at 08/30/19 0400  . acetaminophen (TYLENOL) tablet 325-650 mg  325-650 mg Oral Q4H PRN [name], MD      . acetaminophen (TYLENOL) tablet 650 mg  650 mg Oral Q6H PRN [name], MD   650 mg at 08/26/19 0505  . albuterol (PROVENTIL) (2.5 MG/3ML) 0.083% nebulizer solution 3 mL  3 mL Inhalation Q4H PRN [name], MD      . cefTRIAXone (ROCEPHIN) 2 g in sodium chloride 0.9 % 100 mL IVPB  2 g Intravenous Q24H [name], MD 200 mL/hr at 09/08/19 0034 2 g at 09/08/19 0034  . Chlorhexidine Gluconate Cloth 2 % PADS 6 each  6 each Topical  Daily [name], MD   6 each at 09/07/19 1135  . dextromethorphan-guaiFENesin (MUCINEX DM) 30-600 MG per 12 hr tablet 1 tablet  1 tablet Oral BID PRN [name], MD   1 tablet at 08/26/19 0505  . ferrous sulfate tablet 325 mg  325 mg Oral Daily [name], MD   325 mg at 09/08/19 1154  . Gerhardt's butt cream   Topical BID [name], MD   1 application at 42/59/56 2128  . heparin injection 5,000 Units  5,000 Units Subcutaneous Q8H Lyndee Leo, RPH   5,000 Units at 09/08/19 3875  . HYDROcodone-acetaminophen (NORCO/VICODIN) 5-325 MG per tablet 1 tablet  1 tablet Oral Q6H PRN Wendee Beavers T, MD      . multivitamin with minerals tablet 1 tablet  1 tablet Oral Daily [name], MD   1 tablet at 09/08/19 1153  . ondansetron (ZOFRAN) injection 4 mg  4 mg Intravenous Q6H PRN [name], MD      . pantoprazole (PROTONIX) EC tablet 40 mg  40 mg Oral BID Regalado, Belkys A, MD   40 mg at 09/08/19 1155  . polyethylene glycol (MIRALAX / GLYCOLAX) packet 17 g  17 g Oral Daily  [name], MD   17 g at 09/07/19 1132  . senna (SENOKOT) tablet 8.6 mg  1 tablet Oral Daily [name], MD   8.6 mg at 09/08/19 1154  . simvastatin (ZOCOR) tablet 20 mg  20 mg Oral q1800 [name], MD   20 mg at 09/07/19 1758  . sodium chloride flush (NS) 0.9 % injection 10-40 mL  10-40 mL Intracatheter PRN [name], MD      . sodium chloride flush (NS) 0.9 % injection 3 mL  3 mL Intravenous Q12H [name], MD   3 mL at 09/07/19 2129  . torsemide (DEMADEX) tablet 40 mg  40 mg Oral BID Wendee Beavers T, MD   40 mg at 09/08/19 1154     Discharge Medications: Please see discharge summary for a list of discharge medications.  Relevant Imaging Results:  Relevant Lab Results:   Additional Information SS#[id number] Seltzer, [place]

## 2019-09-08 NOTE — Plan of Care (Signed)
°  Problem: Education: Goal: Knowledge of General Education information will improve Description: Including pain rating scale, medication(s)/side effects and non-pharmacologic comfort measures Outcome: Progressing   Problem: Health Behavior/Discharge Planning: Goal: Ability to manage health-related needs will improve Outcome: Progressing   Problem: Clinical Measurements: Goal: Ability to maintain clinical measurements within normal limits will improve Outcome: Progressing Goal: Will remain free from infection Outcome: Progressing Goal: Diagnostic test results will improve Outcome: Progressing Goal: Respiratory complications will improve Outcome: Progressing Goal: Cardiovascular complication will be avoided Outcome: Progressing   Problem: Activity: Goal: Risk for activity intolerance will decrease Outcome: Progressing   Problem: Nutrition: Goal: Adequate nutrition will be maintained Outcome: Progressing   Problem: Coping: Goal: Level of anxiety will decrease Outcome: Progressing   Problem: Elimination: Goal: Will not experience complications related to bowel motility Outcome: Progressing Goal: Will not experience complications related to urinary retention Outcome: Progressing   Problem: Pain Managment: Goal: General experience of comfort will improve Outcome: Progressing   Problem: Safety: Goal: Ability to remain free from injury will improve Outcome: Progressing   Problem: Skin Integrity: Goal: Risk for impaired skin integrity will decrease Outcome: Progressing   Problem: Cardiovascular: Goal: Ability to achieve and maintain adequate cardiovascular perfusion will improve Outcome: Progressing Goal: Vascular access site(s) Level 0-1 will be maintained Outcome: Progressing   Problem: Education: Goal: Knowledge of cardiac device and self-care will improve Outcome: Progressing Goal: Ability to safely manage health related needs after discharge will  improve Outcome: Progressing Goal: Individualized Educational Video(s) Outcome: Progressing   Problem: Cardiac: Goal: Ability to achieve and maintain adequate cardiopulmonary perfusion will improve Outcome: Progressing

## 2019-09-08 NOTE — TOC Progression Note (Signed)
Transition of Care [facility]) - Progression Note    Patient Details  Name: BRAVERY KETCHAM MRN: [id number] Date of Birth: 1934/10/02  Transition of Care [facility]) CM/SW Kevin, [place] Phone Number: 09/08/2019, 2:03 PM  Clinical Narrative:    CSW left HIPAA compliant message for pt wife [name] at [phone], pt with one SNF offer at this time. CSW also requested new COVID swab from MD if pt 24-48 hrs away from d/c. Await call back from pt wife to initiate authorization.    Expected Discharge Plan: Skilled Nursing Facility Barriers to Discharge: Continued Medical Work up  Expected Discharge Plan and Services Expected Discharge Plan: [place] arrangements for the past 2 months: Single Family Home   Readmission Risk Interventions Readmission Risk Prevention Plan 08/21/2019 06/23/2019 06/20/2019  Transportation Screening Complete Complete Complete  PCP or Specialist Appt within 3-5 Days - Complete -  HRI or [place] - Complete Complete  Social Work Consult for [place] Planning/Counseling - Complete -  Palliative Care Screening - Not Applicable Not Applicable  Medication Review Press photographer) Complete Complete Complete  PCP or Specialist appointment within 3-5 days of discharge Complete - -  [place] or Home Care Consult Complete - -  [place] Complete - -  Some recent data might be hidden

## 2019-09-08 NOTE — Progress Notes (Signed)
PROGRESS NOTE  Thomas [name]:[id number] DOB: 03-22-1934   PCP: [name], MD  Patient is from: Home  DOA: 08/24/2019 LOS: 77  Brief Narrative / Interim history: 85 year old male with impaired hearing, CAD, high-grade AVB s/p BiV-ICD, systolic CHF with recovered EF, CKD-3A, A. fib not on AC due to GI bleed/duodenal AVM, lymphedema, prostate cancer and recent group B strep bacteremia s/p 6 weeks of antibiotics admitted to [facility] after presenting with weakness and presyncope and found to have sepsis secondary to LLL pneumonia and recurrent group B strep bacteremia.  Heart TEE without evidence of vegetation on 08/23/2019.  ID at [facility] recommended transfer to [facility] for ICD removal given recurrent bacteremia.  Patient underwent extraction of BiV-ICD system and insertion of new temporary transvenous pacemaker on 08/25/2019.  He had right-sided BiV-ICD insertion CRT-D on 6/18.  He also underwent dental extraction involving tooth #2, 28 and 31 with 2 quadrants of alveoloplasty.  Hospital course complicated by anemia requiring 2 units of blood transfusions.  He had small bowel enteroscopy without significant pathology.  Subjective: Seen and examined earlier this morning.  No major events overnight or this morning.  No complaints.  He denies chest pain, dyspnea, GI or UTI symptoms.  In-house sign language interpreter used for this encounter.  Objective: Vitals:   09/07/19 2345 09/08/19 0255 09/08/19 0300 09/08/19 0445  BP: 125/61 (!) 105/49    Pulse: 65 70 69   Resp: 19 17 16    Temp: 98 F (36.7 C) 98.1 F (36.7 C)    TempSrc: Oral Oral    SpO2: 95% 97%    Weight:    94.5 kg  Height:        Intake/Output Summary (Last 24 hours) at 09/08/2019 1114 Last data filed at 09/08/2019 0255 Gross per 24 hour  Intake 150 ml  Output 725 ml  Net -575 ml   Filed Weights   09/06/19 0243 09/07/19 0322 09/08/19 0445  Weight: 93.4 kg 95.5 kg 94.5 kg    Examination:  GENERAL: No apparent  distress.  Nontoxic. HEENT: MMM.  Vision grossly intact.  Impaired hearing. NECK: Supple.  No apparent JVD.  RESP: 97% on 2 L.  No IWOB.  Fair aeration but limited exam CVS:  RRR. Heart sounds normal.  ABD/GI/GU: BS+. Abd soft, NTND.  MSK/EXT:  Moves extremities. No apparent deformity.  2+ dependent edema in BUE.  2+ edema in BLE SKIN: Erythematous skin rash in right groin, dressing over right and left upper chest DCI. NEURO: Awake, alert and oriented appropriately.  No apparent focal neuro deficit. PSYCH: Calm. Normal affect.   Procedures:  6/7-small bowel enteroscopy without significant finding  6/8-TEE without vegetation  6/11-ICD extraction  6/16-dental extraction including tooth #2, 28, 31, and 2 quadrants of alveoloplasty  6/18-right-sided BiV-ICD insertion CRT-D on 6/18.  Microbiology summarized: 6/11-MRSA PCR negative 6/12-blood cultures NGTD 6/17-MRSA PCR negative  Assessment & Plan: Septic shock likely secondary to group B streptococcus bacteremia-unclear source of bacteremia. -TEE without evidence of vegetation. -Blood cultures on 6/12 NGTD. -Hardware extraction and implantation as above. -IV ceftriaxone through 09/22/2019 per ID recommendation.  Chronic apical periodontitis/dental caries:  -s/p multiple dental extraction, 2 quadrants of alveoloplasty and gross debridement -Continue oral care with mouthwash  Acute respiratory failure with hypoxia-multifactorial including pneumonia, pleural effusion and CHF exacerbation.  Improving. -PCCM signed off. -Treat treatable causes as below. -Wean oxygen.  -OOB/PT/OT  Bilateral pneumonia: CXR with RUL and RML complete opacification.  Some overlap of pulmonary edema -On  ceftriaxone, IV diuretics, saline nebulizer, vest and bronchodilators.  Acute on chronic systolic heart failure with recovered EF to 55%.  Patient with significant lymphedema likely chronic.  On IV Lasix 40 mg twice daily.  I&O incomplete.  Renal  function stable.  CXR improved. -EP/cardiology on board -Continue p.o. torsemide 40 mg twice daily -Monitor fluid status, renal function and electrolytes.  -Cozaar and Aldactone on hold.  LUE edema: Chronic.  [place] negative for DVT in 06/2019. Repeat showed acute superficial vein thrombosis but unclear significance of this.  Acute on chronic blood loss anemia with iron deficiency: Baseline Hgb 7-8> 7.4 (admit)> 6.7>2u> 8.5>7.6>1u> 8.0.  Iron sat 7%.  TIBC 155.  Reports dark stool but he is on iron supplementations. -Enteroscopy negative.  Received 3 units so far. -Continue iron supplementations -Continue monitoring  Permanent A. Fib?  Atrial paced rhythm on EKG.  Not on AC due to GI bleed   CAD/CABG, DES: Stable. -Continue with statins  Essential hypertension: Normotensive. -Cozaar on hold.  CKD stage IIIa: Stable  Hypokalemia/hypomagnesemia  -Replenish and recheck.  Soft mass upper nasal cavity B/L:  -ENT, Dr. Constance Holster consulted by previous attending recommended outpatient follow-up.  Debility/physical deconditioning -OOB/PT/OT-  Impaired hearing -Utilizing sign language interpreter  Hypoalbuminemia: Albumin 1.4.  Prealbumin 9.2. -Appreciate dietitian input. Body mass index is 26.75 kg/m. Nutrition Problem: Increased nutrient needs Etiology: chronic illness, post-op healing Signs/Symptoms: estimated needs Interventions: Ensure Enlive (each supplement provides 350kcal and 20 grams of protein), MVI  DVT prophylaxis:  heparin injection 5,000 Units Start: 09/02/19 1400 Place and maintain sequential compression device Start: 09/02/19 1000 SCDs Start: 09/01/19 1750 Place TED hose Start: 08/30/19 0740  Code Status: Full code Family Communication: Attempted to call patient's wife but no answer.  Left a message via a sign interpreter Status is: Inpatient  Remains inpatient appropriate because:Unsafe d/c plan and COVID-19 result   Dispo: The patient is from: Home               Anticipated d/c is to: SNF              Anticipated d/c date is: 1 day              Patient currently is medically stable to d/c.       Consultants:  PCCM Cardiology EP Infectious disease   Sch Meds:  Scheduled Meds:  sodium chloride   Intravenous Once   Chlorhexidine Gluconate Cloth  6 each Topical Daily   ferrous sulfate  325 mg Oral Daily   Gerhardt's butt cream   Topical BID   heparin injection (subcutaneous)  5,000 Units Subcutaneous Q8H   multivitamin with minerals  1 tablet Oral Daily   pantoprazole  40 mg Oral BID   polyethylene glycol  17 g Oral Daily   potassium chloride  40 mEq Oral Once   senna  1 tablet Oral Daily   simvastatin  20 mg Oral q1800   sodium chloride flush  3 mL Intravenous Q12H   torsemide  40 mg Oral BID   Continuous Infusions:  sodium chloride 10 mL/hr at 08/30/19 0400   cefTRIAXone (ROCEPHIN) IVPB 2 gram/100 mL NS (Mini-Bag Plus) 2 g (09/08/19 0034)   magnesium sulfate bolus IVPB     PRN Meds:.sodium chloride, acetaminophen, acetaminophen, albuterol, dextromethorphan-guaiFENesin, HYDROcodone-acetaminophen, ondansetron (ZOFRAN) IV, sodium chloride flush  Antimicrobials: Anti-infectives (From admission, onward)   Start     Dose/Rate Route Frequency Ordered Stop   09/01/19 1523  ceFAZolin (ANCEF) IVPB 2 g/50  mL premix        over 30 Minutes  Continuous PRN 09/01/19 1523 09/01/19 1520   08/31/19 1930  gentamicin (GARAMYCIN) 80 mg in sodium chloride 0.9 % 500 mL irrigation        80 mg Irrigation On call 08/31/19 1918 09/01/19 1612   08/30/19 0800  ceFAZolin (ANCEF) IVPB 2g/100 mL premix        2 g 200 mL/hr over 30 Minutes Intravenous To [facility] 08/29/19 1911 08/30/19 0856   08/25/19 2215  ceFAZolin (ANCEF) IVPB 1 g/50 mL premix        1 g 100 mL/hr over 30 Minutes Intravenous Every 6 hours 08/25/19 2025 08/26/19 1212   08/25/19 1611  gentamicin (GARAMYCIN) 80 mg in sodium chloride 0.9 % 500 mL irrigation   Status:  Discontinued          As needed 08/25/19 1612 08/25/19 1936   08/25/19 0915  gentamicin (GARAMYCIN) 80 mg in sodium chloride 0.9 % 500 mL irrigation  Status:  Discontinued        80 mg Irrigation On call 08/25/19 0908 08/25/19 2005   08/25/19 0915  ceFAZolin (ANCEF) IVPB 2g/100 mL premix        2 g 200 mL/hr over 30 Minutes Intravenous On call 08/25/19 0908 08/25/19 1610   08/24/19 0000  cefTRIAXone (ROCEPHIN) 2 g in sodium chloride 0.9 % 100 mL IVPB     Discontinue     2 g 200 mL/hr over 30 Minutes Intravenous Every 24 hours 08/23/19 1725         I have personally reviewed the following labs and images: CBC: Recent Labs  Lab 09/02/19 0550 09/02/19 0550 09/03/19 0400 09/04/19 0403 09/06/19 0441 09/07/19 0134 09/08/19 0500  WBC 5.3  --  5.4 5.2 4.9 4.8  --   NEUTROABS 4.1  --  4.1 3.9  --   --   --   HGB 7.6*   < > 7.5* 7.5* 7.6* 7.9* 8.0*  HCT 26.1*   < > 25.3* 25.8* 25.4* 26.3* 26.3*  MCV 86.1  --  87.8 87.5 87.0 85.9  --   PLT 206  --  199 188 176 186  --    < > = values in this interval not displayed.   BMP &GFR Recent Labs  Lab 09/04/19 0403 09/05/19 0824 09/06/19 0441 09/07/19 0134 09/08/19 0500  NA 141 141 140 139 137  K 3.7 3.3* 3.7 3.7 3.6  CL 103 102 102 102 99  CO2 32 31 32 31 31  GLUCOSE 98 125* 89 93 94  BUN 12 12 12 13 13   CREATININE 0.86 1.01 0.94 0.89 1.02  CALCIUM 7.7* 8.0* 7.9* 7.9* 7.7*  MG  --   --   --  1.8 1.8  PHOS  --   --   --  3.1  --    Estimated Creatinine Clearance: 64.9 mL/min (by C-G formula based on SCr of 1.02 mg/dL). Liver & Pancreas: Recent Labs  Lab 09/07/19 0134  ALBUMIN 1.4*   No results for input(s): LIPASE, AMYLASE in the last 168 hours. No results for input(s): AMMONIA in the last 168 hours. Diabetic: No results for input(s): HGBA1C in the last 72 hours. Recent Labs  Lab 09/04/19 0631 09/05/19 0603 09/06/19 0527 09/07/19 0458 09/08/19 0606  GLUCAP 92 99 96 84 86   Cardiac Enzymes: No results for  input(s): CKTOTAL, CKMB, CKMBINDEX, TROPONINI in the last 168 hours. No results for input(s): PROBNP in  the last 8760 hours. Coagulation Profile: No results for input(s): INR, PROTIME in the last 168 hours. Thyroid Function Tests: No results for input(s): TSH, T4TOTAL, FREET4, T3FREE, THYROIDAB in the last 72 hours. Lipid Profile: No results for input(s): CHOL, HDL, LDLCALC, TRIG, CHOLHDL, LDLDIRECT in the last 72 hours. Anemia Panel: No results for input(s): VITAMINB12, FOLATE, FERRITIN, TIBC, IRON, RETICCTPCT in the last 72 hours. Urine analysis:    Component Value Date/Time   COLORURINE YELLOW (A) 08/20/2019 0924   APPEARANCEUR HAZY (A) 08/20/2019 0924   APPEARANCEUR Clear 12/13/2017 0918   LABSPEC 1.015 08/20/2019 0924   LABSPEC 1.021 11/19/2011 1501   PHURINE 5.0 08/20/2019 0924   GLUCOSEU NEGATIVE 08/20/2019 0924   GLUCOSEU Negative 11/19/2011 1501   HGBUR NEGATIVE 08/20/2019 0924   BILIRUBINUR NEGATIVE 08/20/2019 0924   BILIRUBINUR Negative 12/13/2017 0918   BILIRUBINUR Negative 11/19/2011 1501   KETONESUR NEGATIVE 08/20/2019 0924   PROTEINUR NEGATIVE 08/20/2019 0924   NITRITE NEGATIVE 08/20/2019 0924   LEUKOCYTESUR NEGATIVE 08/20/2019 0924   LEUKOCYTESUR Negative 11/19/2011 1501   Sepsis Labs: Invalid input(s): PROCALCITONIN, [place]  Microbiology: Recent Results (from the past 240 hour(s))  Surgical PCR screen     Status: None   Collection Time: 08/31/19  2:20 PM   Specimen: Nasal Mucosa; Nasal Swab  Result Value Ref Range Status   MRSA, PCR NEGATIVE NEGATIVE Final   Staphylococcus aureus NEGATIVE NEGATIVE Final    Comment: (NOTE) The Xpert SA Assay (FDA approved for NASAL specimens in patients 61 years of age and older), is one component of a comprehensive surveillance program. It is not intended to diagnose infection nor to guide or monitor treatment. Performed at [facility], [place] [address], [place], [place] [address]     Radiology  Studies: No results found.   Janea Schwenn T. [place]  If 7PM-7AM, please contact night-coverage [email or web address] Password [facility] 09/08/2019, 11:14 AM

## 2019-09-09 DIAGNOSIS — I959 Hypotension, unspecified: Secondary | ICD-10-CM

## 2019-09-09 LAB — HEMOGLOBIN AND HEMATOCRIT, BLOOD
HCT: 26.1 % — ABNORMAL LOW (ref 39.0–52.0)
Hemoglobin: 7.8 g/dL — ABNORMAL LOW (ref 13.0–17.0)

## 2019-09-09 LAB — RENAL FUNCTION PANEL
Albumin: 1.4 g/dL — ABNORMAL LOW (ref 3.5–5.0)
Anion gap: 10 (ref 5–15)
BUN: 13 mg/dL (ref 8–23)
CO2: 32 mmol/L (ref 22–32)
Calcium: 7.6 mg/dL — ABNORMAL LOW (ref 8.9–10.3)
Chloride: 97 mmol/L — ABNORMAL LOW (ref 98–111)
Creatinine, Ser: 0.92 mg/dL (ref 0.61–1.24)
GFR calc Af Amer: >60 mL/min (ref >60)
GFR calc non Af Amer: >60 mL/min (ref >60)
Glucose, Bld: 92 mg/dL (ref 70–99)
Phosphorus: 3.2 mg/dL (ref 2.5–4.6)
Potassium: 3.3 mmol/L — ABNORMAL LOW (ref 3.5–5.1)
Sodium: 139 mmol/L (ref 135–145)

## 2019-09-09 LAB — GLUCOSE, CAPILLARY
Glucose-Capillary: 94 mg/dL (ref 70–99)
Glucose-Capillary: 101 mg/dL — ABNORMAL HIGH (ref 70–99)

## 2019-09-09 LAB — MAGNESIUM: Magnesium: 1.7 mg/dL (ref 1.7–2.4)

## 2019-09-09 MED ORDER — MAGNESIUM SULFATE 2 GM/50ML IV SOLN
2.0000 g | Freq: Once | INTRAVENOUS | Status: AC
  Administered 2019-09-09: 2 g via INTRAVENOUS
  Filled 2019-09-09: qty 50

## 2019-09-09 MED ORDER — POTASSIUM CHLORIDE CRYS ER 20 MEQ PO TBCR
40.0000 meq | EXTENDED_RELEASE_TABLET | ORAL | Status: AC
  Administered 2019-09-09 (×2): 40 meq via ORAL
  Filled 2019-09-09 (×2): qty 2

## 2019-09-09 MED ORDER — SPIRONOLACTONE 25 MG PO TABS
25.0000 mg | ORAL_TABLET | Freq: Every day | ORAL | Status: DC
  Administered 2019-09-09 – 2019-09-12 (×4): 25 mg via ORAL
  Filled 2019-09-09 (×4): qty 1

## 2019-09-09 MED ORDER — MIDODRINE HCL 5 MG PO TABS
5.0000 mg | ORAL_TABLET | Freq: Three times a day (TID) | ORAL | Status: DC
  Administered 2019-09-09 – 2019-09-12 (×10): 5 mg via ORAL
  Filled 2019-09-09 (×10): qty 1

## 2019-09-09 MED ORDER — ALBUMIN HUMAN 25 % IV SOLN
25.0000 g | Freq: Four times a day (QID) | INTRAVENOUS | Status: AC
  Administered 2019-09-09 (×3): 25 g via INTRAVENOUS
  Filled 2019-09-09 (×3): qty 100

## 2019-09-09 NOTE — Progress Notes (Signed)
PROGRESS NOTE  Thomas [name] VXB:[id number] DOB: 31-Jan-1935   PCP: [name], MD  Patient is from: Home  DOA: 08/24/2019 LOS: 97  Brief Narrative / Interim history: 85 year old male with impaired hearing, CAD, high-grade AVB s/p BiV-ICD, systolic CHF with recovered EF, CKD-3A, A. fib not on AC due to GI bleed/duodenal AVM, lymphedema, prostate cancer and recent group B strep bacteremia s/p 6 weeks of antibiotics admitted to [facility] after presenting with weakness and presyncope and found to have sepsis secondary to LLL pneumonia and recurrent group B strep bacteremia.  Heart TEE without evidence of vegetation on 08/23/2019.  ID at [facility] recommended transfer to [facility] for ICD removal given recurrent bacteremia.  Patient underwent extraction of BiV-ICD system and insertion of new temporary transvenous pacemaker on 08/25/2019.  He had right-sided BiV-ICD insertion CRT-D on 6/18.  He also underwent dental extraction involving tooth #2, 28 and 31 with 2 quadrants of alveoloplasty.  Hospital course complicated by anemia requiring 2 units of blood transfusions.  He had small bowel enteroscopy without significant pathology.  Therapy recommended SNF but patient and wife prefer to go home with home health and DME.  Subjective: Seen and examined earlier this morning.  No major events overnight of this morning.  Soft blood pressures but not symptomatic.  No complaints.  Denies chest pain, dyspnea, GI or UTI symptoms.  Patient's wife at bedside.  Both patient and wife asked a lot of questions that have attempted to answer.   In-house sign language interpreter used for this encounter.  Objective: Vitals:   09/09/19 0000 09/09/19 0356 09/09/19 0738 09/09/19 0800  BP:  (!) 109/44 (!) 122/55   Pulse: 70 69 70   Resp: 20 18 19    Temp:  97.8 F (36.6 C) 98 F (36.7 C)   TempSrc:  Oral Oral   SpO2:  98%  98%  Weight:  97 kg    Height:        Intake/Output Summary (Last 24 hours) at 09/09/2019  1243 Last data filed at 09/09/2019 1100 Gross per 24 hour  Intake 680 ml  Output 400 ml  Net 280 ml   Filed Weights   09/07/19 0322 09/08/19 0445 09/09/19 0356  Weight: 95.5 kg 94.5 kg 97 kg    Examination:  GENERAL: No apparent distress.  Nontoxic. HEENT: MMM.  Vision grossly intact.  Impaired hearing. NECK: Supple.  No apparent JVD.  RESP: 92% on RA.  No IWOB.  Fair aeration with bibasilar crackles. CVS:  RRR. Heart sounds normal.  ABD/GI/GU: BS+. Abd soft, NTND.  MSK/EXT:  Moves extremities.  2+ dependent edema in BUE.  2+ edema in BLE. SKIN: Erythematous skin rash in right groin.  Dressing over right and left upper chest DCI. NEURO: Awake, alert and oriented appropriately.  No apparent focal neuro deficit. PSYCH: Calm. Normal affect.  Procedures:  6/7-small bowel enteroscopy without significant finding  6/8-TEE without vegetation  6/11-ICD extraction  6/16-dental extraction including tooth #2, 28, 31, and 2 quadrants of alveoloplasty  6/18-right-sided BiV-ICD insertion CRT-D on 6/18.  Microbiology summarized: 6/11-MRSA PCR negative 6/12-blood cultures NGTD 6/17-MRSA PCR negative  Assessment & Plan: Septic shock likely secondary to group B streptococcus bacteremia-unclear source of bacteremia. -TEE without evidence of vegetation. -Blood cultures on 6/12 NGTD. -Hardware extraction and implantation as above. -IV ceftriaxone through 09/22/2019 per ID recommendation.  Chronic apical periodontitis/dental caries:  -s/p multiple dental extraction, 2 quadrants of alveoloplasty and gross debridement -Continue oral care with mouthwash  Acute respiratory  failure with hypoxia-multifactorial including pneumonia, pleural effusion and CHF exacerbation.  Improving. -PCCM signed off. -Treat treatable causes as below. -Weaned off oxygen this morning -IS/OOB/PT/OT  Bilateral pneumonia: CXR with RUL and RML complete opacification.  Some overlap of pulmonary edema -On  ceftriaxone, IV diuretics, saline nebulizer, vest and bronchodilators.  Acute on chronic systolic heart failure with recovered EF to 55%.  Patient with significant lymphedema likely chronic.  On torsemide 40 mg twice daily.  I&O incomplete.  Renal function stable.  Soft blood pressures. -EP/cardiology signed off. -Continue p.o. torsemide 40 mg twice daily -Albumin for better diuresis given significant hypoalbuminemia -Monitor fluid status, renal function and electrolytes.  -Cozaar and Aldactone on hold.  Hypotension: Now symptomatic. -IV albumin and midodrine started.  LUE edema: Chronic.  [place] negative for DVT in 06/2019. Repeat showed acute superficial vein thrombosis but unclear significance of this.  Acute on chronic blood loss anemia with iron deficiency: Baseline Hgb 7-8> 7.4 (admit)> 6.7>2u> 8.5>7.6>1u> 8.0> 7.8.  Iron sat 7%.  TIBC 155.  Reports dark stool but he is on iron supplementations. -Enteroscopy negative.  Received 3 units so far. -Continue iron supplementations -Continue monitoring  Permanent A. Fib?  Atrial paced rhythm on EKG.  Not on AC due to GI bleed   CAD/CABG, DES: Stable. -Continue with statins  Essential hypertension: Hypotensive. -Cozaar on hold.  Albumin and midodrine as above  CKD stage IIIa: Stable  Hypokalemia/hypomagnesemia  -Replenish and recheck.  Soft mass upper nasal cavity B/L:  -ENT, Dr. Constance Holster consulted by previous attending recommended outpatient follow-up.  Debility/physical deconditioning -OOB/PT/OT-  Impaired hearing -Utilizing sign language interpreter  Hypoalbuminemia: Albumin 1.4.  Prealbumin 9.2. -IV albumin as above. Body mass index is 27.46 kg/m. Nutrition Problem: Increased nutrient needs Etiology: chronic illness, post-op healing Signs/Symptoms: estimated needs Interventions: Ensure Enlive (each supplement provides 350kcal and 20 grams of protein), MVI  DVT prophylaxis:  heparin injection 5,000 Units Start:  09/02/19 1400 Place and maintain sequential compression device Start: 09/02/19 1000 SCDs Start: 09/01/19 1750 Place TED hose Start: 08/30/19 0740  Code Status: Full code Family Communication: Updated patient's wife at bedside using sign interpreter Status is: Inpatient  Remains inpatient appropriate because:Unsafe d/c plan and appropriate DME and home health prior to discharge   Dispo: The patient is from: Home              Anticipated d/c is to: Home              Anticipated d/c date is: 2 days              Patient currently is medically stable to d/c.       Consultants:  PCCM Cardiology EP Infectious disease   Sch Meds:  Scheduled Meds: . sodium chloride   Intravenous Once  . Chlorhexidine Gluconate Cloth  6 each Topical Daily  . ferrous sulfate  325 mg Oral Daily  . Gerhardt's butt cream   Topical BID  . heparin injection (subcutaneous)  5,000 Units Subcutaneous Q8H  . midodrine  5 mg Oral TID WC  . multivitamin with minerals  1 tablet Oral Daily  . pantoprazole  40 mg Oral BID  . polyethylene glycol  17 g Oral Daily  . potassium chloride  40 mEq Oral Q3H  . senna  1 tablet Oral Daily  . simvastatin  20 mg Oral q1800  . sodium chloride flush  3 mL Intravenous Q12H  . spironolactone  25 mg Oral Daily  . torsemide  40  mg Oral BID   Continuous Infusions: . sodium chloride 10 mL/hr at 08/30/19 0400  . albumin human 25 g (09/09/19 0953)  . cefTRIAXone (ROCEPHIN) IVPB 2 gram/100 mL NS (Mini-Bag Plus) 2 g (09/09/19 0019)   PRN Meds:.sodium chloride, acetaminophen, acetaminophen, albuterol, ondansetron (ZOFRAN) IV, sodium chloride flush  Antimicrobials: Anti-infectives (From admission, onward)   Start     Dose/Rate Route Frequency Ordered Stop   09/01/19 1523  ceFAZolin (ANCEF) IVPB 2 g/50 mL premix        over 30 Minutes  Continuous PRN 09/01/19 1523 09/01/19 1520   08/31/19 1930  gentamicin (GARAMYCIN) 80 mg in sodium chloride 0.9 % 500 mL irrigation        80  mg Irrigation On call 08/31/19 1918 09/01/19 1612   08/30/19 0800  ceFAZolin (ANCEF) IVPB 2g/100 mL premix        2 g 200 mL/hr over 30 Minutes Intravenous To ShortStay Surgical 08/29/19 1911 08/30/19 0856   08/25/19 2215  ceFAZolin (ANCEF) IVPB 1 g/50 mL premix        1 g 100 mL/hr over 30 Minutes Intravenous Every 6 hours 08/25/19 2025 08/26/19 1212   08/25/19 1611  gentamicin (GARAMYCIN) 80 mg in sodium chloride 0.9 % 500 mL irrigation  Status:  Discontinued          As needed 08/25/19 1612 08/25/19 1936   08/25/19 0915  gentamicin (GARAMYCIN) 80 mg in sodium chloride 0.9 % 500 mL irrigation  Status:  Discontinued        80 mg Irrigation On call 08/25/19 0908 08/25/19 2005   08/25/19 0915  ceFAZolin (ANCEF) IVPB 2g/100 mL premix        2 g 200 mL/hr over 30 Minutes Intravenous On call 08/25/19 0908 08/25/19 1610   08/24/19 0000  cefTRIAXone (ROCEPHIN) 2 g in sodium chloride 0.9 % 100 mL IVPB     Discontinue     2 g 200 mL/hr over 30 Minutes Intravenous Every 24 hours 08/23/19 1725         I have personally reviewed the following labs and images: CBC: Recent Labs  Lab 09/03/19 0400 09/03/19 0400 09/04/19 0403 09/06/19 0441 09/07/19 0134 09/08/19 0500 09/09/19 0400  WBC 5.4  --  5.2 4.9 4.8  --   --   NEUTROABS 4.1  --  3.9  --   --   --   --   HGB 7.5*   < > 7.5* 7.6* 7.9* 8.0* 7.8*  HCT 25.3*   < > 25.8* 25.4* 26.3* 26.3* 26.1*  MCV 87.8  --  87.5 87.0 85.9  --   --   PLT 199  --  188 176 186  --   --    < > = values in this interval not displayed.   BMP &GFR Recent Labs  Lab 09/05/19 0824 09/06/19 0441 09/07/19 0134 09/08/19 0500 09/09/19 0400  NA 141 140 139 137 139  K 3.3* 3.7 3.7 3.6 3.3*  CL 102 102 102 99 97*  CO2 31 32 31 31 32  GLUCOSE 125* 89 93 94 92  BUN 12 12 13 13 13   CREATININE 1.01 0.94 0.89 1.02 0.92  CALCIUM 8.0* 7.9* 7.9* 7.7* 7.6*  MG  --   --  1.8 1.8 1.7  PHOS  --   --  3.1  --  3.2   Estimated Creatinine Clearance: 72 mL/min (by C-G  formula based on SCr of 0.92 mg/dL). Liver & Pancreas: Recent Labs  Lab 09/07/19 0134 09/09/19 0400  ALBUMIN 1.4* 1.4*   No results for input(s): LIPASE, AMYLASE in the last 168 hours. No results for input(s): AMMONIA in the last 168 hours. Diabetic: No results for input(s): HGBA1C in the last 72 hours. Recent Labs  Lab 09/06/19 0527 09/07/19 0458 09/08/19 0606 09/09/19 0457 09/09/19 0745  GLUCAP 96 84 86 94 101*   Cardiac Enzymes: No results for input(s): CKTOTAL, CKMB, CKMBINDEX, TROPONINI in the last 168 hours. No results for input(s): PROBNP in the last 8760 hours. Coagulation Profile: No results for input(s): INR, PROTIME in the last 168 hours. Thyroid Function Tests: No results for input(s): TSH, T4TOTAL, FREET4, T3FREE, THYROIDAB in the last 72 hours. Lipid Profile: No results for input(s): CHOL, HDL, LDLCALC, TRIG, CHOLHDL, LDLDIRECT in the last 72 hours. Anemia Panel: No results for input(s): VITAMINB12, FOLATE, FERRITIN, TIBC, IRON, RETICCTPCT in the last 72 hours. Urine analysis:    Component Value Date/Time   COLORURINE YELLOW (A) 08/20/2019 0924   APPEARANCEUR HAZY (A) 08/20/2019 0924   APPEARANCEUR Clear 12/13/2017 0918   LABSPEC 1.015 08/20/2019 0924   LABSPEC 1.021 11/19/2011 1501   PHURINE 5.0 08/20/2019 0924   GLUCOSEU NEGATIVE 08/20/2019 0924   GLUCOSEU Negative 11/19/2011 1501   HGBUR NEGATIVE 08/20/2019 0924   BILIRUBINUR NEGATIVE 08/20/2019 0924   BILIRUBINUR Negative 12/13/2017 0918   BILIRUBINUR Negative 11/19/2011 1501   KETONESUR NEGATIVE 08/20/2019 0924   PROTEINUR NEGATIVE 08/20/2019 0924   NITRITE NEGATIVE 08/20/2019 0924   LEUKOCYTESUR NEGATIVE 08/20/2019 0924   LEUKOCYTESUR Negative 11/19/2011 1501   Sepsis Labs: Invalid input(s): PROCALCITONIN, [place]  Microbiology: Recent Results (from the past 240 hour(s))  Surgical PCR screen     Status: None   Collection Time: 08/31/19  2:20 PM   Specimen: Nasal Mucosa; Nasal Swab    Result Value Ref Range Status   MRSA, PCR NEGATIVE NEGATIVE Final   Staphylococcus aureus NEGATIVE NEGATIVE Final    Comment: (NOTE) The Xpert SA Assay (FDA approved for NASAL specimens in patients 41 years of age and older), is one component of a comprehensive surveillance program. It is not intended to diagnose infection nor to guide or monitor treatment. Performed at [facility], [place] [address] [place], [place] [address]   SARS CORONAVIRUS 2 (TAT 6-24 HRS) Nasopharyngeal Nasopharyngeal Swab     Status: None   Collection Time: 09/08/19  5:40 PM   Specimen: Nasopharyngeal Swab  Result Value Ref Range Status   SARS Coronavirus 2 NEGATIVE NEGATIVE Final    Comment: (NOTE) SARS-CoV-2 target nucleic acids are NOT DETECTED.  The SARS-CoV-2 RNA is generally detectable in upper and lower respiratory specimens during the acute phase of infection. Negative results do not preclude SARS-CoV-2 infection, do not rule out co-infections with other pathogens, and should not be used as the sole basis for treatment or other patient management decisions. Negative results must be combined with clinical observations, patient history, and epidemiological information. The expected result is Negative.  Fact Sheet for Patients: SugarRoll.be  Fact Sheet for Healthcare Providers: https://www.woods-mathews.com/  This test is not yet approved or cleared by the [place] FDA and  has been authorized for detection and/or diagnosis of SARS-CoV-2 by FDA under an Emergency Use Authorization (EUA). This EUA will remain  in effect (meaning this test can be used) for the duration of the COVID-19 declaration under Se ction 564(b)(1) of the Act, 21 U.S.C. section 360bbb-3(b)(1), unless the authorization is terminated or revoked sooner.  Performed at [facility]  [facility], [place] [address] [place], [place] [address]     Radiology Studies: No results  found.   Thomas [name]  If 7PM-7AM, please contact night-coverage [email or web address] Password [facility] 09/09/2019, 12:43 PM

## 2019-09-09 NOTE — TOC Progression Note (Signed)
Transition of Care [facility]) - Progression Note    Patient Details  Name: MAHLON GABRIELLE MRN: [id number] Date of Birth: [birth year]  Transition of Care [facility]) CM/SW [place], [place] Phone Number: 09/09/2019, 1:15 PM  Clinical Narrative:     CSW met with pts wife and sign language interpreter. Wife stated that the pt will be going home on Monday per the MD. Wife stated that a hospital bed was needed and is unsure about other DME. Wife states that pt needs [facility] pt/ot set up.   CSW informed NCM of HH and DME needs. The communication center can be called at [phone] to set up interpreter services.   Expected Discharge Plan: [place] Barriers to Discharge: Continued Medical Work up  Expected Discharge Plan and Services Expected Discharge Plan: [place] arrangements for the past 2 months: [place] Determinants of Health (SDOH) Interventions    Readmission Risk Interventions Readmission Risk Prevention Plan 08/21/2019 06/23/2019 06/20/2019  Transportation Screening Complete Complete Complete  PCP or Specialist Appt within 3-5 Days - Complete -  HRI or [place] - Complete Complete  Social Work Consult for [place] Planning/Counseling - Complete -  Palliative Care Screening - Not Applicable Not Applicable  Medication Review Press photographer) Complete Complete Complete  PCP or Specialist appointment within 3-5 days of discharge Complete - -  [place] or Home Care Consult Complete - -  [place] Complete - -  Some recent data might be hidden   Emeterio Reeve, Latanya Presser, [place] Social Worker [phone]

## 2019-09-09 NOTE — TOC Progression Note (Signed)
Transition of Care [facility]) - Progression Note    Patient Details  Name: Thomas [name] MRN: [id number] Date of Birth: 16-Apr-1934  Transition of Care [facility]) CM/SW Contact  Carles Collet, RN Phone Number: 09/09/2019, 1:43 PM  Clinical Narrative:    Patient from home w wife. They have experience with PICC care and IV meds at home. Plan for Rocephin through 7/9 (will be given IVP, as was last home IV med)  So there should be some familiarity with administration. Patient know to [facility] from last home care needs, and would like to continue services. Referral made 6/9 to [facility] from [facility] and they were able to accept for PT OT, but not nursing care at that time due to staffing. Reached out to [facility] again with tentative DC daye for Monday with SOC needs for Tuesday. Liaison checking availability for RN. Pam w [facility] accepted for IV needs [facility] does not take HTA).  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  LVM w wife [name] at [phone]. This home phone line has a video sign language interpreter. She was requesting hospital bed, may also need 3/1 and rollator.  Sign Language Interpreter to be at the hospital tomorrow morning at 8am. Her number is [phone]. (provided this number to Carolynn Sayers as well).      Expected Discharge Plan: [place] Barriers to Discharge: Continued Medical Work up  Expected Discharge Plan and Services Expected Discharge Plan: [place] arrangements for the past 2 months: Single Family Home                           HH Arranged: RN, PT, OT [facility] Agency: [facility] Date [place]: 09/09/19 Time [place]: [place] Representative spoke with at [place]: [place] ([place]) Interventions    Readmission Risk Interventions Readmission Risk Prevention Plan 08/21/2019 06/23/2019 06/20/2019  Transportation  Screening Complete Complete Complete  PCP or Specialist Appt within 3-5 Days - Complete -  HRI or [place] - Complete Complete  Social Work Consult for [place] Planning/Counseling - Complete -  Palliative Care Screening - Not Applicable Not Applicable  Medication Review Press photographer) Complete Complete Complete  PCP or Specialist appointment within 3-5 days of discharge Complete - -  [place] or Home Care Consult Complete - -  [place] Complete - -  Some recent data might be hidden

## 2019-09-10 LAB — GLUCOSE, CAPILLARY: Glucose-Capillary: 125 mg/dL — ABNORMAL HIGH (ref 70–99)

## 2019-09-10 LAB — RENAL FUNCTION PANEL
Albumin: 2 g/dL — ABNORMAL LOW (ref 3.5–5.0)
Anion gap: 8 (ref 5–15)
BUN: 13 mg/dL (ref 8–23)
CO2: 33 mmol/L — ABNORMAL HIGH (ref 22–32)
Calcium: 8 mg/dL — ABNORMAL LOW (ref 8.9–10.3)
Chloride: 99 mmol/L (ref 98–111)
Creatinine, Ser: 0.98 mg/dL (ref 0.61–1.24)
GFR calc Af Amer: >60 mL/min (ref >60)
GFR calc non Af Amer: >60 mL/min (ref >60)
Glucose, Bld: 104 mg/dL — ABNORMAL HIGH (ref 70–99)
Phosphorus: 2.7 mg/dL (ref 2.5–4.6)
Potassium: 3.6 mmol/L (ref 3.5–5.1)
Sodium: 140 mmol/L (ref 135–145)

## 2019-09-10 LAB — BPAM RBC
Blood Product Expiration Date: 202107172359
Blood Product Expiration Date: 202107172359
ISSUE DATE / TIME: 202106111602
ISSUE DATE / TIME: 202106231724
Unit Type and Rh: 5100
Unit Type and Rh: 5100

## 2019-09-10 LAB — TYPE AND SCREEN
ABO/RH(D): O POS
Antibody Screen: NEGATIVE
Donor AG Type: NEGATIVE
Donor AG Type: NEGATIVE
Unit division: 0
Unit division: 0

## 2019-09-10 LAB — HEMOGLOBIN AND HEMATOCRIT, BLOOD
HCT: 27.7 % — ABNORMAL LOW (ref 39.0–52.0)
Hemoglobin: 8.4 g/dL — ABNORMAL LOW (ref 13.0–17.0)

## 2019-09-10 LAB — CBC
HCT: 25 % — ABNORMAL LOW (ref 39.0–52.0)
Hemoglobin: 7.5 g/dL — ABNORMAL LOW (ref 13.0–17.0)
MCH: 26.3 pg (ref 26.0–34.0)
MCHC: 30 g/dL (ref 30.0–36.0)
MCV: 87.7 fL (ref 80.0–100.0)
Platelets: 166 10*3/uL (ref 150–400)
RBC: 2.85 MIL/uL — ABNORMAL LOW (ref 4.22–5.81)
RDW: 21.9 % — ABNORMAL HIGH (ref 11.5–15.5)
WBC: 4.3 10*3/uL (ref 4.0–10.5)
nRBC: 0 % (ref 0.0–0.2)

## 2019-09-10 LAB — OCCULT BLOOD X 1 CARD TO LAB, STOOL: Fecal Occult Bld: POSITIVE — AB

## 2019-09-10 LAB — MAGNESIUM: Magnesium: 2 mg/dL (ref 1.7–2.4)

## 2019-09-10 LAB — PREPARE RBC (CROSSMATCH)

## 2019-09-10 MED ORDER — SODIUM CHLORIDE 0.9% IV SOLUTION: Freq: Once | INTRAVENOUS | Status: AC

## 2019-09-10 MED ORDER — POTASSIUM CHLORIDE CRYS ER 20 MEQ PO TBCR
40.0000 meq | EXTENDED_RELEASE_TABLET | Freq: Once | ORAL | Status: AC
  Administered 2019-09-10: 40 meq via ORAL
  Filled 2019-09-10: qty 2

## 2019-09-10 NOTE — Progress Notes (Signed)
Left arm weeping , cleansed , wrapped with abd. dressing and kerlix.

## 2019-09-10 NOTE — TOC Progression Note (Addendum)
Transition of Care [facility]) - Progression Note    Patient Details  Name: Thomas [name] MRN: [id number] Date of Birth: December 20, 1934  Transition of Care [facility]) CM/SW [place], [place] Phone Number: 09/10/2019, 4:14 PM  Clinical Narrative:     CSW gave bed offers to pt. CSW gave pt printed explanation of bed offers and the insurance process due to not having a sign language interpreter. Pt gave CSW a thumbs up to [facility] with [facility]. CSW called [facility] to start insurance auth. [facility] did not have an admissions person to speak to today. CSW is unsure if pt will need new covid test, last on don 09/08/19.   Expected Discharge Plan: [place] Barriers to Discharge: Continued Medical Work up  Expected Discharge Plan and Services Expected Discharge Plan: [place] arrangements for the past 2 months: Single Family Home                           HH Arranged: RN, PT, OT [facility] Agency: [facility] Date [facility] Agency Contacted: 09/09/19 Time [place]: [place] Representative spoke with at [place]: [place] ([place]) Interventions    Readmission Risk Interventions Readmission Risk Prevention Plan 08/21/2019 06/23/2019 06/20/2019  Transportation Screening Complete Complete Complete  PCP or Specialist Appt within 3-5 Days - Complete -  HRI or [place] - Complete Complete  Social Work Consult for [place] Planning/Counseling - Complete -  Palliative Care Screening - Not Applicable Not Applicable  Medication Review Press photographer) Complete Complete Complete  PCP or Specialist appointment within 3-5 days of discharge Complete - -  [place] or [place] Complete - -  [place] Complete - -  Some recent data might be hidden   Emeterio Reeve, Latanya Presser, [place] Social Worker [phone]

## 2019-09-10 NOTE — Progress Notes (Signed)
PROGRESS NOTE  Thomas [name]:[id number] DOB: November 25, 1934   PCP: [name], MD  Patient is from: Home  DOA: 08/24/2019 LOS: 49  Brief Narrative / Interim history: 85 year old male with impaired hearing, CAD, high-grade AVB s/p BiV-ICD, systolic CHF with recovered EF, CKD-3A, A. fib not on AC due to GI bleed/duodenal AVM, lymphedema, prostate cancer and recent group B strep bacteremia s/p 6 weeks of antibiotics admitted to [facility] after presenting with weakness and presyncope and found to have sepsis secondary to LLL pneumonia and recurrent group B strep bacteremia.  Heart TEE without evidence of vegetation on 08/23/2019.  ID at [facility] recommended transfer to [facility] for ICD removal given recurrent bacteremia.  Patient underwent extraction of BiV-ICD system and insertion of new temporary transvenous pacemaker on 08/25/2019.  He had right-sided BiV-ICD insertion CRT-D on 6/18.  He also underwent dental extraction involving tooth #2, 28 and 31 with 2 quadrants of alveoloplasty.  Hospital course complicated by anemia requiring 2 units of blood transfusions.  He had small bowel enteroscopy without significant pathology.  Therapy recommended SNF.   Subjective: Seen and examined earlier this morning.  No major events overnight of this morning.  No complaints.  He denies pain, shortness of breath, palpitation, dizziness, GI or UTI symptoms.  In-house sign language interpreter used for this encounter.  Objective: Vitals:   09/09/19 2345 09/10/19 0300 09/10/19 0742 09/10/19 1026  BP: (!) 104/32 (!) 121/23  (!) 112/52  Pulse: 70 68  73  Resp: 20 20  18   Temp: 98 F (36.7 C) 97.9 F (36.6 C) (!) 97.3 F (36.3 C) 98.1 F (36.7 C)  TempSrc: Oral Oral Axillary Oral  SpO2: 92% 97% 90% 94%  Weight:  97.1 kg    Height:        Intake/Output Summary (Last 24 hours) at 09/10/2019 1040 Last data filed at 09/10/2019 1026 Gross per 24 hour  Intake 610 ml  Output 1800 ml  Net -1190 ml    Filed Weights   09/08/19 0445 09/09/19 0356 09/10/19 0300  Weight: 94.5 kg 97 kg 97.1 kg    Examination:  GENERAL: Frail and chronically ill-appearing.  No apparent distress.  Nontoxic. HEENT: MMM.  Vision grossly intact.  Impaired hearing. NECK: Supple.  No apparent JVD.  RESP: 92% on RA while in the room.  Poor inspiratory effort.  Only 500 cc on I-S.  No IWOB.  Fair aeration with fine bibasilar crackles. CVS:  RRR. Heart sounds normal.  ABD/GI/GU: BS+. Abd soft, NTND.  MSK/EXT:  Moves extremities.  2+ dependent edema in BUE.  2+ edema in BLE. SKIN: Erythematous skin rash in right groin. NEURO: Awake, alert and oriented appropriately.  No apparent focal neuro deficit. PSYCH: Calm. Normal affect.  Procedures:  6/7-small bowel enteroscopy without significant finding  6/8-TEE without vegetation  6/11-ICD extraction  6/16-dental extraction including tooth #2, 28, 31, and 2 quadrants of alveoloplasty  6/18-right-sided BiV-ICD insertion CRT-D on 6/18.  Microbiology summarized: 6/11-MRSA PCR negative 6/12-blood cultures NGTD 6/17-MRSA PCR negative  Assessment & Plan: Septic shock likely secondary to group B streptococcus bacteremia-unclear source of bacteremia. -TEE without evidence of vegetation. -Blood cultures on 6/12 NGTD. -Hardware extraction and implantation as above. -IV ceftriaxone through 09/22/2019 per ID recommendation.  Chronic apical periodontitis/dental caries:  -s/p multiple dental extraction, 2 quadrants of alveoloplasty and gross debridement -Continue oral care with mouthwash  Acute respiratory failure with hypoxia-multifactorial including pneumonia, pleural effusion and CHF exacerbation.  Improving but poor inspiratory effort. -PCCM  signed off. -Treat treatable causes as below. -Weaned off oxygen this morning again. -IS/OOB/PT/OT  Bilateral pneumonia: CXR with RUL and RML complete opacification.  Some overlap of pulmonary edema -On ceftriaxone, IV  diuretics, saline nebulizer, vest and bronchodilators.  Acute on chronic systolic heart failure with recovered EF to 55%.  Patient with significant lymphedema likely chronic.  On torsemide 40 mg twice daily.  About 1.8 L UOP/24 hours.  Net -4.4 L.  Renal function stable.  Soft blood pressures but improved. -EP/cardiology signed off. -Continue p.o. torsemide 40 mg twice daily -Continue Aldactone.  Cozaar on hold. -Monitor fluid status, renal function and electrolytes.   Hypotension: Improved. -Continue low-dose midodrine.  Started on 6/26.  Acute on chronic blood loss anemia with iron deficiency: Baseline Hgb 7-8> 7.4 (admit)> 6.7>2u> 8.5>7.6>1u> 8.0> 7.8> 7.5>1u.  Iron sat 7%.  TIBC 155.  Reports dark stool but he is on iron supplementations. -Enteroscopy negative.  Received 3 units so far.  Transfuse 1 more unit -Recheck Hemoccult.  If positive, discontinue subcu heparin -Continue iron supplementations -Continue monitoring  Permanent A. Fib?  Atrial paced rhythm on EKG.  Not on AC due to GI bleed   CAD/CABG, DES: Stable. -Continue with statins  Essential hypertension: Hypotensive but improved. -Cozaar on hold.  Albumin and midodrine as above  CKD stage IIIa: Stable -Continue monitoring  Hypokalemia/hypomagnesemia  -Replenish and recheck.  Soft mass upper nasal cavity B/L:  -ENT, Dr. Constance Holster consulted by previous attending recommended outpatient follow-up.  Debility/physical deconditioning -OOB/PT/OT-SNF  Impaired hearing -Utilizing sign language interpreter  Hypoalbuminemia: Albumin 2.0 after IV albumin.  Prealbumin 9.2. Body mass index is 27.48 kg/m. Nutrition Problem: Increased nutrient needs Etiology: chronic illness, post-op healing Signs/Symptoms: estimated needs Interventions: Ensure Enlive (each supplement provides 350kcal and 20 grams of protein), MVI  DVT prophylaxis:  heparin injection 5,000 Units Start: 09/02/19 1400 Place and maintain sequential  compression device Start: 09/02/19 1000 SCDs Start: 09/01/19 1750 Place TED hose Start: 08/30/19 0740  Code Status: Full code Family Communication: Updated patient's wife at bedside using sign interpreter on 6/26 Status is: Inpatient  Remains inpatient appropriate because:Unsafe d/c plan.  Now interested in SNF for IV antibiotic administration.   Dispo: The patient is from: Home              Anticipated d/c is to: SNF              Anticipated d/c date is: 1 day              Patient currently is medically stable to d/c.       Consultants:  PCCM Cardiology EP Infectious disease   Sch Meds:  Scheduled Meds: . sodium chloride   Intravenous Once  . sodium chloride   Intravenous Once  . Chlorhexidine Gluconate Cloth  6 each Topical Daily  . ferrous sulfate  325 mg Oral Daily  . Gerhardt's butt cream   Topical BID  . heparin injection (subcutaneous)  5,000 Units Subcutaneous Q8H  . midodrine  5 mg Oral TID WC  . multivitamin with minerals  1 tablet Oral Daily  . pantoprazole  40 mg Oral BID  . polyethylene glycol  17 g Oral Daily  . senna  1 tablet Oral Daily  . simvastatin  20 mg Oral q1800  . sodium chloride flush  3 mL Intravenous Q12H  . spironolactone  25 mg Oral Daily  . torsemide  40 mg Oral BID   Continuous Infusions: . sodium chloride 10 mL/hr  at 08/30/19 0400  . cefTRIAXone (ROCEPHIN) IVPB 2 gram/100 mL NS (Mini-Bag Plus) 2 g (09/10/19 0017)   PRN Meds:.sodium chloride, acetaminophen, acetaminophen, albuterol, ondansetron (ZOFRAN) IV, sodium chloride flush  Antimicrobials: Anti-infectives (From admission, onward)   Start     Dose/Rate Route Frequency Ordered Stop   09/01/19 1523  ceFAZolin (ANCEF) IVPB 2 g/50 mL premix        over 30 Minutes  Continuous PRN 09/01/19 1523 09/01/19 1520   08/31/19 1930  gentamicin (GARAMYCIN) 80 mg in sodium chloride 0.9 % 500 mL irrigation        80 mg Irrigation On call 08/31/19 1918 09/01/19 1612   08/30/19 0800   ceFAZolin (ANCEF) IVPB 2g/100 mL premix        2 g 200 mL/hr over 30 Minutes Intravenous To ShortStay [facility] 08/29/19 1911 08/30/19 0856   08/25/19 2215  ceFAZolin (ANCEF) IVPB 1 g/50 mL premix        1 g 100 mL/hr over 30 Minutes Intravenous Every 6 hours 08/25/19 2025 08/26/19 1212   08/25/19 1611  gentamicin (GARAMYCIN) 80 mg in sodium chloride 0.9 % 500 mL irrigation  Status:  Discontinued          As needed 08/25/19 1612 08/25/19 1936   08/25/19 0915  gentamicin (GARAMYCIN) 80 mg in sodium chloride 0.9 % 500 mL irrigation  Status:  Discontinued        80 mg Irrigation On call 08/25/19 0908 08/25/19 2005   08/25/19 0915  ceFAZolin (ANCEF) IVPB 2g/100 mL premix        2 g 200 mL/hr over 30 Minutes Intravenous On call 08/25/19 0908 08/25/19 1610   08/24/19 0000  cefTRIAXone (ROCEPHIN) 2 g in sodium chloride 0.9 % 100 mL IVPB     Discontinue     2 g 200 mL/hr over 30 Minutes Intravenous Every 24 hours 08/23/19 1725         I have personally reviewed the following labs and images: CBC: Recent Labs  Lab 09/04/19 0403 09/04/19 0403 09/06/19 0441 09/07/19 0134 09/08/19 0500 09/09/19 0400 09/10/19 0410  WBC 5.2  --  4.9 4.8  --   --  4.3  NEUTROABS 3.9  --   --   --   --   --   --   HGB 7.5*   < > 7.6* 7.9* 8.0* 7.8* 7.5*  HCT 25.8*   < > 25.4* 26.3* 26.3* 26.1* 25.0*  MCV 87.5  --  87.0 85.9  --   --  87.7  PLT 188  --  176 186  --   --  166   < > = values in this interval not displayed.   BMP &GFR Recent Labs  Lab 09/06/19 0441 09/07/19 0134 09/08/19 0500 09/09/19 0400 09/10/19 0410  NA 140 139 137 139 140  K 3.7 3.7 3.6 3.3* 3.6  CL 102 102 99 97* 99  CO2 32 31 31 32 33*  GLUCOSE 89 93 94 92 104*  BUN 12 13 13 13 13   CREATININE 0.94 0.89 1.02 0.92 0.98  CALCIUM 7.9* 7.9* 7.7* 7.6* 8.0*  MG  --  1.8 1.8 1.7 2.0  PHOS  --  3.1  --  3.2 2.7   Estimated Creatinine Clearance: 67.6 mL/min (by C-G formula based on SCr of 0.98 mg/dL). Liver & Pancreas: Recent Labs   Lab 09/07/19 0134 09/09/19 0400 09/10/19 0410  ALBUMIN 1.4* 1.4* 2.0*   No results for input(s): LIPASE, AMYLASE in the  last 168 hours. No results for input(s): AMMONIA in the last 168 hours. Diabetic: No results for input(s): HGBA1C in the last 72 hours. Recent Labs  Lab 09/07/19 0458 09/08/19 0606 09/09/19 0457 09/09/19 0745 09/10/19 0426  GLUCAP 84 86 94 101* 125*   Cardiac Enzymes: No results for input(s): CKTOTAL, CKMB, CKMBINDEX, TROPONINI in the last 168 hours. No results for input(s): PROBNP in the last 8760 hours. Coagulation Profile: No results for input(s): INR, PROTIME in the last 168 hours. Thyroid Function Tests: No results for input(s): TSH, T4TOTAL, FREET4, T3FREE, THYROIDAB in the last 72 hours. Lipid Profile: No results for input(s): CHOL, HDL, LDLCALC, TRIG, CHOLHDL, LDLDIRECT in the last 72 hours. Anemia Panel: No results for input(s): VITAMINB12, FOLATE, FERRITIN, TIBC, IRON, RETICCTPCT in the last 72 hours. Urine analysis:    Component Value Date/Time   COLORURINE YELLOW (A) 08/20/2019 0924   APPEARANCEUR HAZY (A) 08/20/2019 0924   APPEARANCEUR Clear 12/13/2017 0918   LABSPEC 1.015 08/20/2019 0924   LABSPEC 1.021 11/19/2011 1501   PHURINE 5.0 08/20/2019 0924   GLUCOSEU NEGATIVE 08/20/2019 0924   GLUCOSEU Negative 11/19/2011 1501   HGBUR NEGATIVE 08/20/2019 0924   BILIRUBINUR NEGATIVE 08/20/2019 0924   BILIRUBINUR Negative 12/13/2017 0918   BILIRUBINUR Negative 11/19/2011 1501   KETONESUR NEGATIVE 08/20/2019 0924   PROTEINUR NEGATIVE 08/20/2019 0924   NITRITE NEGATIVE 08/20/2019 0924   LEUKOCYTESUR NEGATIVE 08/20/2019 0924   LEUKOCYTESUR Negative 11/19/2011 1501   Sepsis Labs: Invalid input(s): PROCALCITONIN, [place]  Microbiology: Recent Results (from the past 240 hour(s))  Surgical PCR screen     Status: None   Collection Time: 08/31/19  2:20 PM   Specimen: Nasal Mucosa; Nasal Swab  Result Value Ref Range Status   MRSA, PCR  NEGATIVE NEGATIVE Final   Staphylococcus aureus NEGATIVE NEGATIVE Final    Comment: (NOTE) The Xpert SA Assay (FDA approved for NASAL specimens in patients 64 years of age and older), is one component of a comprehensive surveillance program. It is not intended to diagnose infection nor to guide or monitor treatment. Performed at [facility], [place] [address] [place], [place] [address]   SARS CORONAVIRUS 2 (TAT 6-24 HRS) Nasopharyngeal Nasopharyngeal Swab     Status: None   Collection Time: 09/08/19  5:40 PM   Specimen: Nasopharyngeal Swab  Result Value Ref Range Status   SARS Coronavirus 2 NEGATIVE NEGATIVE Final    Comment: (NOTE) SARS-CoV-2 target nucleic acids are NOT DETECTED.  The SARS-CoV-2 RNA is generally detectable in upper and lower respiratory specimens during the acute phase of infection. Negative results do not preclude SARS-CoV-2 infection, do not rule out co-infections with other pathogens, and should not be used as the sole basis for treatment or other patient management decisions. Negative results must be combined with clinical observations, patient history, and epidemiological information. The expected result is Negative.  Fact Sheet for Patients: SugarRoll.be  Fact Sheet for Healthcare Providers: https://www.woods-mathews.com/  This test is not yet approved or cleared by the [place] FDA and  has been authorized for detection and/or diagnosis of SARS-CoV-2 by FDA under an Emergency Use Authorization (EUA). This EUA will remain  in effect (meaning this test can be used) for the duration of the COVID-19 declaration under Se ction 564(b)(1) of the Act, 21 U.S.C. section 360bbb-3(b)(1), unless the authorization is terminated or revoked sooner.  Performed at [facility], [place] [address] [place], [place] [address]     Radiology Studies: No results found.  Amara Justen T. [place]  If  7PM-7AM, please contact night-coverage [email or web address] Password [email or web address] 09/10/2019, 10:40 AM

## 2019-09-10 NOTE — TOC Progression Note (Signed)
Transition of Care [facility]) - Progression Note    Patient Details  Name: TROI FLORENDO MRN: [id number] Date of Birth: [birth year]  Transition of [facility]) CM/SW Contact  Carles Collet, RN Phone Number: 09/10/2019, 8:50 AM  Clinical Narrative:   Damaris Schooner w patient, interpreter [name] at bedside, and wife over the phone w phone interpreter. DC plan discussed. Discussed plan for home w IV ABx. Murat states that his son Sherrie Sport, not his wife, administered the medications last time, his wife is not comfortable doing it. Sherrie Sport is getting married today, and will be on his honeymoon for the next week. Nathanel will not have a support person for IV administration. Wife, [name] called in. She states that she has been discussing the DC plan with her two sons and they are all in agreement that he will need to go to a SNF for IV administration of medications and rehab at DC. They prefer a SNF close to [facility]. [facility] is first preference if available.  [name] to be at the hospital Monday am at 10. I have notified communication services [phone] for the need for a SL interpreter.         Expected Discharge Plan: [place] Barriers to Discharge: Continued Medical Work up  Expected Discharge Plan and Services Expected Discharge Plan: [place] arrangements for the past 2 months: Single Family Home                           HH Arranged: RN, PT, OT [facility] Agency: [facility] Date [place]: 09/09/19 Time [place]: [place] Representative spoke with at [place]: [place] ([place]) Interventions    Readmission Risk Interventions Readmission Risk Prevention Plan 08/21/2019 06/23/2019 06/20/2019  Transportation Screening Complete Complete Complete  PCP or Specialist Appt within 3-5 Days - Complete -  HRI or [place] - Complete Complete  Social Work Consult for [place] Planning/Counseling -  Complete -  Palliative Care Screening - Not Applicable Not Applicable  Medication Review Press photographer) Complete Complete Complete  PCP or Specialist appointment within 3-5 days of discharge Complete - -  [place] or Home Care Consult Complete - -  [place] Complete - -  Some recent data might be hidden

## 2019-09-11 ENCOUNTER — Telehealth: Payer: Self-pay

## 2019-09-11 DIAGNOSIS — E8809 Other disorders of plasma-protein metabolism, not elsewhere classified: Secondary | ICD-10-CM

## 2019-09-11 DIAGNOSIS — H9190 Unspecified hearing loss, unspecified ear: Secondary | ICD-10-CM

## 2019-09-11 LAB — RENAL FUNCTION PANEL
Albumin: 2 g/dL — ABNORMAL LOW (ref 3.5–5.0)
Anion gap: 8 (ref 5–15)
BUN: 15 mg/dL (ref 8–23)
CO2: 32 mmol/L (ref 22–32)
Calcium: 8.2 mg/dL — ABNORMAL LOW (ref 8.9–10.3)
Chloride: 101 mmol/L (ref 98–111)
Creatinine, Ser: 0.96 mg/dL (ref 0.61–1.24)
GFR calc Af Amer: >60 mL/min (ref >60)
GFR calc non Af Amer: >60 mL/min (ref >60)
Glucose, Bld: 99 mg/dL (ref 70–99)
Phosphorus: 2.6 mg/dL (ref 2.5–4.6)
Potassium: 3.7 mmol/L (ref 3.5–5.1)
Sodium: 141 mmol/L (ref 135–145)

## 2019-09-11 LAB — CBC
HCT: 28.4 % — ABNORMAL LOW (ref 39.0–52.0)
Hemoglobin: 8.7 g/dL — ABNORMAL LOW (ref 13.0–17.0)
MCH: 26.4 pg (ref 26.0–34.0)
MCHC: 30.6 g/dL (ref 30.0–36.0)
MCV: 86.1 fL (ref 80.0–100.0)
Platelets: 196 10*3/uL (ref 150–400)
RBC: 3.3 MIL/uL — ABNORMAL LOW (ref 4.22–5.81)
RDW: 21.9 % — ABNORMAL HIGH (ref 11.5–15.5)
WBC: 5.1 10*3/uL (ref 4.0–10.5)
nRBC: 0 % (ref 0.0–0.2)

## 2019-09-11 LAB — GLUCOSE, CAPILLARY: Glucose-Capillary: 95 mg/dL (ref 70–99)

## 2019-09-11 LAB — MAGNESIUM: Magnesium: 1.8 mg/dL (ref 1.7–2.4)

## 2019-09-11 MED ORDER — MIDODRINE HCL 5 MG PO TABS: 5.0000 mg | ORAL_TABLET | Freq: Three times a day (TID) | ORAL | 1 refills | Status: DC

## 2019-09-11 MED ORDER — MAGNESIUM SULFATE 2 GM/50ML IV SOLN
2.0000 g | Freq: Once | INTRAVENOUS | Status: AC
  Administered 2019-09-11: 2 g via INTRAVENOUS
  Filled 2019-09-11: qty 50

## 2019-09-11 MED ORDER — GERHARDT'S BUTT CREAM: 1.0000 "application " | TOPICAL_CREAM | Freq: Two times a day (BID) | CUTANEOUS | Status: AC

## 2019-09-11 MED ORDER — TORSEMIDE 20 MG PO TABS: 40.0000 mg | ORAL_TABLET | Freq: Two times a day (BID) | ORAL | 3 refills | Status: AC

## 2019-09-11 MED ORDER — CEFTRIAXONE IV (FOR PTA / DISCHARGE USE ONLY): 2.0000 g | INTRAVENOUS | 0 refills | Status: DC

## 2019-09-11 MED ORDER — FERROUS SULFATE 325 (65 FE) MG PO TABS: 325.0000 mg | ORAL_TABLET | Freq: Two times a day (BID) | ORAL | 0 refills | Status: DC

## 2019-09-11 MED ORDER — POLYETHYLENE GLYCOL 3350 17 G PO PACK: 17.0000 g | PACK | Freq: Every day | ORAL | 0 refills | Status: DC | PRN

## 2019-09-11 MED ORDER — POTASSIUM CHLORIDE CRYS ER 20 MEQ PO TBCR
40.0000 meq | EXTENDED_RELEASE_TABLET | Freq: Once | ORAL | Status: AC
  Administered 2019-09-11: 40 meq via ORAL
  Filled 2019-09-11: qty 2

## 2019-09-11 NOTE — Discharge Summary (Signed)
Physician Discharge Summary  Thomas [name]:[id number] DOB: [birth year] DOA: 08/24/2019  PCP: [name], MD  Admit date: 08/24/2019 Discharge date: 09/11/2019  Admitted From: Home Disposition: SNF  Recommendations for Outpatient Follow-up:  1. Follow ups as below. 2. Please obtain CBC with differential, BMP and Mg weekly, and ESR and CRP biweekly 3. Please follow up on the following pending results: None   Discharge Condition: Stable CODE STATUS: Full code   Follow-up Information    Thomas [name], DDS Follow up.   Specialty: Dentistry Why: Call for F/U evaluation of healing and suture removal as Outpatient after discharge Contact information: [place] [address] [phone]        Thomas Lance, MD Follow up on 12/04/2019.   Specialty: Cardiology Why: at 3 pm for 3 month pacermaker check Contact information: [address] [place] [address] [phone]        [facility] Follow up on 09/14/2019.   Why: at 3 pm for post hospital pacer check Contact information: [place] [id number] [phone]       Thomas Gala, MD Follow up in 2 week(s).   Specialty: Otolaryngology Contact information: [address] [place] [address] [phone]               Hospital Course: 85 year old male with impaired hearing, CAD, high-grade AVB s/p BiV-ICD, systolic CHF with recovered EF, CKD-3A, A. fib not on AC due to GI bleed/duodenal AVM, lymphedema, prostate cancer and recent group B strep bacteremia s/p 6 weeks of antibiotics admitted to [facility] after presenting with weakness and presyncope and found to have sepsis secondary to LLL pneumonia and recurrent group B strep bacteremia.  Heart TEE without evidence of vegetation on 08/23/2019.  ID at [facility] recommended transfer to [facility] for ICD removal given  recurrent bacteremia.  Patient underwent extraction of BiV-ICD system and insertion of new temporary transvenous pacemaker on 08/25/2019.  He had right-sided BiV-ICD insertion CRT-D on 6/18.  He also underwent dental extraction involving tooth #2, 28 and 31 with 2 quadrants of alveoloplasty.  ID recommended IV ceftriaxone through 09/22/2019.  Hospital course complicated by anemia requiring 4 units of blood transfusions throughout his hospitalization.  He had small bowel enteroscopy without significant pathology or finding. His hemoglobin remained stable prior to discharge.  Patient developed respiratory failure with hypoxia in the setting of pneumonia, pleural effusion, hypoalbuminemia and CHF exacerbation.  He was started on IV Lasix with significant improvement.  He eventually transitioned to torsemide 40 mg twice daily.  He will be discharged on torsemide 40 mg twice daily.  He is also on Aldactone and midodrine.  Therapy recommended SNF.   See individual problem list below for more on hospital course.  Discharge Diagnoses:  Septic shock likely secondary to group B streptococcus bacteremia-unclear source of bacteremia. -TEE without evidence of vegetation. -Blood cultures on 6/12 NGTD. -Hardware extraction and implantation as above. -IV ceftriaxone through 09/22/2019 per ID recommendation. -Weekly CBC with differential, BMP and magnesium.  -CRP and ESR biweekly  Chronic apical periodontitis/dental caries:  -s/p multiple dental extraction, 2 quadrants of alveoloplasty and gross debridement -Continue oral care with mouthwash  Acute respiratory failure with hypoxia-multifactorial including pneumonia, pleural effusion and CHF exacerbation.  Liberated off oxygen.  -PCCM signed off. -Treat treatable causes as below. -Encourage incentive spirometry and OOB -Continue PT/OT  Bilateral pneumonia: CXR with RUL and  RML complete opacification.  Some overlap of pulmonary edema -On ceftriaxone, IV  diuretics and breathing treatments.  Acute on chronic systolic heart failure with recovered EF to 55%.  Patient with significant lymphedema likely chronic.    Has been doing well on torsemide 40 mg twice daily for the last few days.  Renal function and electrolytes stable. -EP/cardiology signed off. -Continue p.o. torsemide 40 mg twice daily -Continue Aldactone. Cozaar discontinued in the setting of soft blood pressures -Monitor fluid status with daily weight -Sodium and fluid restriction.  -Check renal function and magnesium in 1 week.  Hypotension: Improved. -Continue low-dose midodrine.  Started on 6/26.  Acute on chronic blood loss anemia with iron deficiency: Baseline Hgb 7-8> 7.4 (admit)> 6.7>2u> 8.5>7.6>1u> 8.0> 7.8> 7.5>1u> 8.4> 8.7.  Iron sat 7%.  TIBC 155.  Reports dark stool but he is on iron supplementations. -Enteroscopy negative. -Continue iron supplementations with bowel regimen -Recheck CBC in about a week  Permanent A. Fib?  Atrial paced rhythm on EKG.  Not on AC due to GI bleed   CAD/CABG, DES: Stable. -Continue with statins  Essential hypertension: Normotensive for most part. -Diuretics on midodrine as above  AKI: Initially thought to have CKD.  Resolved. -Continue monitoring  Hypokalemia/hypomagnesemia: Resolved. -Recheck in 1 week  Soft mass upper nasal cavity B/L:  -ENT, Dr. Constance Holster consulted by previous attending recommended outpatient follow-up. -Outpatient follow-up with Dr. Para Skeans in 2 to 3 weeks  Debility/physical deconditioning -Continue therapy at SNF  Impaired hearing -Utilizing sign language interpreter  Hypoalbuminemia: Albumin 2.0 after IV albumin.  Prealbumin 9.2. -Would benefit from dietitian guidance at SNF. Body mass index is 27.48 kg/m. Nutrition Problem: Increased nutrient needs Etiology: chronic illness, post-op healing Signs/Symptoms: estimated needs Interventions: Ensure Enlive (each supplement provides 350kcal and 20  grams of protein), MVI   Pressure Injury 06/21/19 Buttocks Mid Stage 2 -  Partial thickness loss of dermis presenting as a shallow open injury with a red, pink wound bed without slough. (Active)  06/21/19 0700  Location: Buttocks  Location Orientation: Mid  Staging: Stage 2 -  Partial thickness loss of dermis presenting as a shallow open injury with a red, pink wound bed without slough.  Wound Description (Comments):   Present on Admission:     Discharge Exam: Vitals:   09/11/19 0733 09/11/19 1051  BP:  112/60  Pulse:    Resp: 20 (!) 21  Temp: 98 F (36.7 C) 97.7 F (36.5 C)  SpO2: 91% 93%    GENERAL: Frail looking male.  No apparent distress.  Nontoxic. HEENT: MMM.  Vision grossly intact.  Impaired hearing. NECK: Supple.  No apparent JVD.  RESP: On room air.  No IWOB.  Fair aeration but limited by poor inspiratory effort CVS:  RRR. Heart sounds normal.  ABD/GI/GU: Bowel sounds present. Soft. Non tender.  MSK/EXT:  Moves extremities. No apparent deformity.  2+ dependent edema in BUE.  2+ BLE edema SKIN: Some skin erythema in right groin and on his back NEURO: Awake, alert and oriented appropriately.  No apparent focal neuro deficit. PSYCH: Calm. Normal affect.  Discharge Instructions  Discharge Instructions    Anaphylaxis Kit: Provided to treat any anaphylactic reaction to the medication being provided to the patient if First Dose or when requested by physician   Complete by: As directed    Epinephrine 19m/ml vial / amp: Administer 0.359m(0.71m51msubcutaneously once for moderate to severe anaphylaxis, nurse to call physician and pharmacy when reaction occurs and call 911 if  needed for immediate care   Diphenhydramine 94m/ml IV vial: Administer 25-533mIV/IM PRN for first dose reaction, rash, itching, mild reaction, nurse to call physician and pharmacy when reaction occurs   Sodium Chloride 0.9% NS 50099mV: Administer if needed for hypovolemic blood pressure drop or as  ordered by physician after call to physician with anaphylactic reaction   Change dressing on IV access line weekly and PRN   Complete by: As directed    Diet - low sodium heart healthy   Complete by: As directed    Flush IV access with Sodium Chloride 0.9% and Heparin 10 units/ml or 100 units/ml   Complete by: As directed    Increase activity slowly   Complete by: As directed    No dressing needed   Complete by: As directed    Usual skin care to prevent pressure injury     Allergies as of 09/11/2019      Reactions   Entresto [sacubitril-valsartan] Swelling   And bruising of arm   Phenazopyridine Nausea Only, Other (See Comments)   GI UPSET   Ramipril Other (See Comments)   unk Other reaction(s): Other (See Comments), Unknown unk      Medication List    STOP taking these medications   cefTRIAXone 2 g in sodium chloride 0.9 % 100 mL   losartan 25 MG tablet Commonly known as: COZAAR     TAKE these medications   acetaminophen 325 MG tablet Commonly known as: TYLENOL Take 2 tablets (650 mg total) by mouth every 6 (six) hours as needed for mild pain or fever.   albuterol (2.5 MG/3ML) 0.083% nebulizer solution Commonly known as: PROVENTIL Inhale 3 mLs into the lungs every 4 (four) hours as needed for wheezing or shortness of breath.   cefTRIAXone  IVPB Commonly known as: ROCEPHIN Inject 2 g into the vein daily for 28 days. Indication:  Group B strep bacteremia with pacemaker  First Dose: No Last Day of Therapy:  09/22/2019 Labs - Once weekly:  CBC/D and BMP, Labs - Every other week:  ESR and CRP Method of administration: IV Push Method of administration may be changed at the discretion of home infusion pharmacist based upon assessment of the patient and/or caregiver's ability to self-administer the medication ordered.   dextromethorphan-guaiFENesin 30-600 MG 12hr tablet Commonly known as: MUCINEX DM Take 1 tablet by mouth 2 (two) times daily as needed for cough.     ferrous sulfate 325 (65 FE) MG tablet Take 1 tablet (325 mg total) by mouth 2 (two) times daily with a meal. What changed: when to take this   Gerhardt's butt cream Crea Apply 1 application topically 2 (two) times daily.   ipratropium-albuterol 0.5-2.5 (3) MG/3ML Soln Commonly known as: DUONEB Take 3 mLs by nebulization every 6 (six) hours.   midodrine 5 MG tablet Commonly known as: PROAMATINE Take 1 tablet (5 mg total) by mouth 3 (three) times daily with meals.   multivitamin with minerals Tabs tablet Take 1 tablet by mouth daily.   ondansetron 4 MG/2ML Soln injection Commonly known as: ZOFRAN Inject 2 mLs (4 mg total) into the vein every 8 (eight) hours as needed for nausea or vomiting.   pantoprazole 40 MG injection Commonly known as: PROTONIX Inject 40 mg into the vein every 12 (twelve) hours.   polyethylene glycol 17 g packet Commonly known as: MIRALAX / GLYCOLAX Take 17 g by mouth daily as needed for mild constipation.   simvastatin 20 MG tablet Commonly known  as: ZOCOR TAKE 1 TABLET BY MOUTH EVERY DAY   spironolactone 25 MG tablet Commonly known as: ALDACTONE Take 1 tablet (25 mg total) by mouth daily.   torsemide 20 MG tablet Commonly known as: DEMADEX Take 2 tablets (40 mg total) by mouth 2 (two) times daily. What changed: when to take this            Durable Medical Equipment  (From admission, onward)         Start     Ordered   09/09/19 1243  For home use only DME Hospital bed  Once       Question Answer Comment  Length of Need Lifetime   The above medical condition requires: Patient requires the ability to reposition frequently   Head must be elevated greater than: 30 degrees   Bed type Semi-electric   Hoyer Lift Yes   Trapeze Bar Yes   Support Surface: Gel Overlay      09/09/19 1243           Discharge Care Instructions  (From admission, onward)         Start     Ordered   09/11/19 0000  Change dressing on IV access line  weekly and PRN  (Home infusion instructions - [facility] )        09/11/19 1045   09/11/19 0000  No dressing needed       Comments: Usual skin care to prevent pressure injury   09/11/19 1045          Consultations:  PCCM  Cardiology  Procedures/Studies: 6/7-small bowel enteroscopy without significant finding  6/8-TEE without vegetation  6/11-ICD extraction  6/16-dental extraction including tooth #2, 28, 31, and 2 quadrants of alveoloplasty  6/18-right-sided BiV-ICD insertion CRT-D on 6/18.  2D echo on 08/21/2019 1. Left ventricular ejection fraction, by estimation, is 55 to 60%. The  left ventricle has normal function. The left ventricle has no regional  wall motion abnormalities. Left ventricular diastolic parameters were  normal.  2. Right ventricular systolic function is normal. The right ventricular  size is normal. There is moderately elevated pulmonary artery systolic  pressure.  3. Left atrial size was mildly dilated.  4. Right atrial size was mildly dilated.  5. The mitral valve is normal in structure. Moderate mitral valve  regurgitation.  6. Tricuspid valve regurgitation is moderate.  7. The aortic valve is normal in structure. Aortic valve regurgitation is  trivial.    DG Chest 1 View  Result Date: 08/25/2019 CLINICAL DATA:  Pacemaker lead removal EXAM: DG C-ARM 1-60 MIN; CHEST  1 VIEW FLUOROSCOPY TIME:  Fluoroscopy Time: 27 minutes 15 seconds 716.09 mGy Number of Acquired Spot Images: 1 COMPARISON:  Chest radiograph August 24, 2019 FINDINGS: Frontal spot image over the left lower chest shows stable pacemaker leads attached to the right heart and coronary sinus region. Visualized heart appears enlarged. IMPRESSION: Stable appearing lead placement on frontal view.  Cardiomegaly. Electronically Signed   By: Lowella Grip III M.D.   On: 08/25/2019 19:15   DG Chest 1 View  Result Date: 08/24/2019 CLINICAL DATA:  Pneumonia EXAM: CHEST  1  VIEW COMPARISON:  08/18/2019 FINDINGS: Single frontal view of the chest demonstrates stable postsurgical changes from median sternotomy. Multi lead pacer again noted. Cardiac silhouette is enlarged. There is progressive central vascular congestion and bibasilar veiling opacities, left greater than right, consistent with airspace disease and effusions. No pneumothorax. IMPRESSION: 1. Worsening airspace disease, effusions, and central  vascular congestion, most consistent with congestive heart failure. Superimposed infection cannot be excluded. Electronically Signed   By: Randa Ngo M.D.   On: 08/24/2019 21:49   DG Chest 2 View  Result Date: 09/04/2019 CLINICAL DATA:  Pneumonia EXAM: CHEST - 2 VIEW COMPARISON:  09/02/2017 FINDINGS: Small bilateral pleural effusions. Bilateral diffuse interstitial thickening. Bibasilar hazy airspace disease. No pneumothorax. Stable cardiomegaly. Prior CABG. Cardiac pacemaker is noted. Thoracic aortic atherosclerosis. Severe osteoarthritis of the right glenohumeral joint. IMPRESSION: Cardiomegaly with bilateral pleural effusions and diffuse interstitial thickening. Differential considerations include pulmonary edema with bibasilar atelectasis versus pneumonia. Electronically Signed   By: Kathreen Devoid   On: 09/04/2019 11:15   DG Chest 2 View  Result Date: 09/02/2019 CLINICAL DATA:  Implantable cardioverter-defibrillator placement. EXAM: CHEST - 2 VIEW COMPARISON:  Chest radiograph dated 09/01/2019 FINDINGS: A right subclavian approach cardiac device is redemonstrated. Sternotomy hardware and surgical clips overlying the mediastinum and heart are redemonstrated. A left upper extremity peripherally inserted central venous catheter tip overlies the superior vena cava. The heart remains enlarged. Vascular calcifications are seen in the aortic arch. There is increased aeration of the right upper lung. Small bilateral pleural effusions with associated atelectasis/airspace disease  appear similar to prior exam. There is no pneumothorax. The visualized skeletal structures are unremarkable. IMPRESSION: Increased aeration of the right upper lung. Unchanged small bilateral pleural effusions with associated atelectasis/airspace disease. Electronically Signed   By: Zerita Boers M.D.   On: 09/02/2019 11:37   EP PPM/ICD IMPLANT  Result Date: 09/01/2019 Conclusion: Successful insertion of a new biventricular ICD and removal of the 2 previously implanted temporary transvenous pacing leads in a patient with complete heart block, chronic atrial fibrillation, chronic systolic heart failure in the setting of ischemic cardiomyopathy ejection fraction 30%.  Of note the patient's decision to place an ICD lead was made based on his severe tricuspid regurgitation and severe difficulty placing pacing leads at the time of his temporary permanent insertion during his extraction procedure. Cristopher Peru, MD  CT 3D RECON AT SCANNER  Result Date: 08/29/2019 CLINICAL DATA:  Nonspecific (abnormal) findings on radiological and other examination of musculoskeletal system. EXAM: 3-DIMENSIONAL CT IMAGE RENDERING ON ACQUISITION WORKSTATION TECHNIQUE: 3-dimensional CT images were rendered by post-processing of the original CT data on an acquisition workstation. The 3-dimensional CT images were interpreted and findings were reported in the accompanying complete CT report for this study COMPARISON:  None. FINDINGS: Refer to  CT maxillofacial 08/28/2019 IMPRESSION: Refer to CT maxillofacial 08/28/2019 Electronically Signed   By: Franchot Gallo M.D.   On: 08/29/2019 09:16   CT MAXILLOFACIAL W CONTRAST  Result Date: 08/28/2019 CLINICAL DATA:  Endocarditis suspected.  Rule out dental infection. EXAM: CT MAXILLOFACIAL WITH CONTRAST TECHNIQUE: Multidetector CT imaging of the maxillofacial structures was performed with intravenous contrast. Multiplanar CT image reconstructions were also generated. CONTRAST:  53m OMNIPAQUE  IOHEXOL 300 MG/ML  SOLN COMPARISON:  None. FINDINGS: Osseous: No facial fracture.  No acute skeletal abnormality. Numerous caries are present bilaterally. Extensive destruction of the right upper third molar with periapical lucency. Orbits: No orbital mass or edema.  Bilateral cataract extraction. Sinuses: Soft tissue masslike lesion in the upper nasal cavity measuring approximately 22 x 14 mm. This is on both sides of the septum. No destruction of the septum. Soft tissues: No soft tissue mass or adenopathy in the area of interest. Limited intracranial: Generalized atrophy. No acute intracranial abnormality. IMPRESSION: 1. Numerous caries bilaterally. Right upper third molar caries with periapical lucency.  2. Soft tissue mass in the upper nasal cavity bilaterally. Recommend direct visualization and biopsy if appropriate. Electronically Signed   By: Franchot Gallo M.D.   On: 08/28/2019 20:09   DG CHEST PORT 1 VIEW  Result Date: 09/06/2019 CLINICAL DATA:  Shortness of breath. Chronic bilateral pleural effusions. EXAM: PORTABLE CHEST 1 VIEW COMPARISON:  09/04/2019 and 09/03/2019 FINDINGS: There has been improvement in the haziness at both lung bases, probably representing a combination of decreasing effusions and improved mild interstitial edema. Chronic cardiomegaly. Chronic pulmonary vascular congestion. AICD in place. Aortic atherosclerosis. CABG. IMPRESSION: Improving bilateral pleural effusions and interstitial edema. Electronically Signed   By: Lorriane Shire M.D.   On: 09/06/2019 09:41   DG Chest Port 1 View  Result Date: 09/03/2019 CLINICAL DATA:  Acute respiratory distress EXAM: PORTABLE CHEST 1 VIEW COMPARISON:  September 02, 2019 FINDINGS: The left PICC line is in good position. Stable AICD device. Stable cardiomegaly. The hila and mediastinum are unchanged. No pneumothorax. Bilateral pleural effusions with underlying opacities. Pulmonary edema. Increasing opacity in the right mid lung. IMPRESSION: 1.  Increasing opacity in the right mid lung could represent atelectasis underlying layering effusion versus developing infiltrate. Recommend clinical correlation and attention on follow-up. 2. Layering pleural effusions with underlying atelectasis. 3. Cardiomegaly and mild edema. Electronically Signed   By: Dorise Bullion III M.D   On: 09/03/2019 11:38   DG Chest Port 1 View  Result Date: 09/01/2019 CLINICAL DATA:  ICD placement EXAM: PORTABLE CHEST 1 VIEW COMPARISON:  Radiograph 08/29/2019 FINDINGS: There is near complete opacification of the right mid to upper lung with few residual air lucencies which could reflect some aerated lung parenchyma or air bronchograms. Patchy opacity noted in the residually aerated portion of the right lower lobe. Large right pleural effusion is present. More small left pleural effusion with some posterior layering distribution. Postsurgical changes related to prior CABG including intact and aligned sternotomy hardware and multiple surgical clips projecting over the mediastinum. Stable cardiomegaly. The aorta is calcified. The remaining cardiomediastinal contours are unremarkable. Removal of the previously seen left chest wall pacer pack with interval placement of a 2 lead pacer/defibrillator device overlying the right chest wall with leads at the right atrium and coronary sinus. Telemetry leads overlie the chest. IMPRESSION: 1. Removal of the previously seen left chest wall pacer device with placement of a right chest wall pacer/AICD with leads at the cardiac apex and coronary sinus. 2. Near complete opacification of the right mid to upper lung with few residual air lucencies which could reflect some aerated lung parenchyma or air bronchograms., suspect this may be part of the large right pleural effusion with atelectatic collapse though some underlying airspace process is not excluded particularly given some patchy opacity in the residually aerated portion of the right lower lobe.  3. Small left effusion as well. 4. Stable cardiomegaly and features of prior CABG. Electronically Signed   By: [name] M.D.   On: 09/01/2019 20:26   DG CHEST PORT 1 VIEW  Result Date: 08/29/2019 CLINICAL DATA:  85 year old male status post ICD extraction. EXAM: PORTABLE CHEST 1 VIEW COMPARISON:  Chest x-ray 08/28/2019. FINDINGS: Left costophrenic sulcus incompletely imaged. Lung volumes are slightly low. Opacities are noted throughout the mid to lower lungs bilaterally, which may reflect areas of atelectasis and/or consolidation. Small bilateral pleural effusions. Cephalization of the pulmonary vasculature. Mild cardiomegaly. Upper mediastinal contours are distorted by patient's rotation to the left. Aortic atherosclerosis. Status post median sternotomy. Left-sided pacemaker device  in place with lead tip projecting over the expected location of the right ventricle. Additional lead extending into the right-side of the heart via the inferior vena cava. IMPRESSION: 1. Postoperative changes and support apparatus, as above. 2. Persistent areas of atelectasis and/or consolidation throughout the mid to lower lungs bilaterally with small bilateral pleural effusions. 3. Cardiomegaly. Electronically Signed   By: Vinnie Langton M.D.   On: 08/29/2019 10:42   DG CHEST PORT 1 VIEW  Result Date: 08/28/2019 CLINICAL DATA:  Chest pain EXAM: PORTABLE CHEST 1 VIEW COMPARISON:  August 26, 2019 FINDINGS: There is cardiomegaly. There is mild pulmonary venous hypertension. Pacemaker leads attached to right atrium and coronary sinus,, stable. There are persistent bilateral pleural effusions with airspace opacity in the left lower lobe, stable. No new opacity evident. There is no evident adenopathy. There is aortic atherosclerosis. No bone lesions. IMPRESSION: Cardiomegaly with a degree of pulmonary vascular congestion. Persistent pleural effusions bilaterally with left lower lobe airspace opacity concerning for atelectasis and  potential pneumonia in the left lower lung region. No new opacity evident. Pacemaker leads unchanged in position. Status post coronary artery bypass grafting. Aortic Atherosclerosis (ICD10-I70.0). Electronically Signed   By: Lowella Grip III M.D.   On: 08/28/2019 08:11   DG CHEST PORT 1 VIEW  Result Date: 08/26/2019 CLINICAL DATA:  Congestive heart failure EXAM: PORTABLE CHEST 1 VIEW COMPARISON:  August 24, 2019 FINDINGS: There is cardiomegaly with pulmonary vascularity normal. Pacemaker leads attached to right atrium and coronary sinus. Postoperative changes noted. There is aortic atherosclerosis. There are pleural effusions bilaterally. There is airspace opacity throughout the left lower lobe. There is ill-defined airspace opacity in the left mid and lower lung regions as well. No adenopathy appreciable.  No bone lesions. IMPRESSION: Cardiomegaly with postoperative changes. Pleural effusions bilaterally with areas of airspace opacity in the mid and lower lung regions, more severe on the left than on the right, similar to 2 days prior. Aortic Atherosclerosis (ICD10-I70.0). Electronically Signed   By: Lowella Grip III M.D.   On: 08/26/2019 10:31   DG Chest Port 1 View  Result Date: 08/18/2019 CLINICAL DATA:  Fever and weakness EXAM: PORTABLE CHEST 1 VIEW COMPARISON:  July 14, 2019 FINDINGS: There is cardiomegaly with pulmonary vascularity normal. Pacemaker leads are attached to right atrium, right ventricle, and coronary sinus. There is consolidation in the left lower lobe with small left pleural effusion. There is a small right pleural effusion with slight right base atelectasis. Patient is status post internal mammary bypass grafting. There is aortic atherosclerosis. No adenopathy. There is arthropathy in each shoulder. IMPRESSION: Cardiomegaly. Airspace opacity consistent with pneumonia left base. Small pleural effusions bilaterally. Postoperative changes noted. Aortic Atherosclerosis  (ICD10-I70.0). Electronically Signed   By: Lowella Grip III M.D.   On: 08/18/2019 14:20   DG Chest Port 1V same Day  Result Date: 08/28/2019 CLINICAL DATA:  PICC placement. EXAM: PORTABLE CHEST 1 VIEW COMPARISON:  08/28/2019 and CT chest 05/19/2019. FINDINGS: Left PICC tip is in the low SVC. Heart is enlarged, stable. Thoracic aorta is calcified. Permanent pacemaker lead tip and trans venous temporary pacemaker lead tip, from an IVC approach, are unchanged in position. Mid and lower lung zone mixed interstitial and airspace opacification with bilateral pleural effusions, similar. Left lower lobe consolidation persists. IMPRESSION: 1. Left PICC tip is in the SVC. 2. Congestive heart failure with left lower lobe collapse/consolidation. Pneumonia cannot be excluded. 3.  Aortic atherosclerosis (ICD10-I70.0). Electronically Signed   By: Lorin Picket  M.D.   On: 08/28/2019 10:49   DG C-Arm 1-60 Min  Result Date: 08/25/2019 CLINICAL DATA:  Pacemaker lead removal EXAM: DG C-ARM 1-60 MIN; CHEST  1 VIEW FLUOROSCOPY TIME:  Fluoroscopy Time: 27 minutes 15 seconds 716.09 mGy Number of Acquired Spot Images: 1 COMPARISON:  Chest radiograph August 24, 2019 FINDINGS: Frontal spot image over the left lower chest shows stable pacemaker leads attached to the right heart and coronary sinus region. Visualized heart appears enlarged. IMPRESSION: Stable appearing lead placement on frontal view.  Cardiomegaly. Electronically Signed   By: Lowella Grip III M.D.   On: 08/25/2019 19:15   ECHOCARDIOGRAM COMPLETE  Result Date: 08/21/2019    ECHOCARDIOGRAM REPORT   Patient Name:   Thomas [name] Date of Exam: 08/21/2019 Medical Rec #:  [id number]      Height:       74.0 in Accession #:    [id number]     Weight:       203.3 lb Date of Birth:  06/06/1934     BSA:          2.188 m Patient Age:    30 years       BP:           121/57 mmHg Patient Gender: M              HR:           61 bpm. Exam Location:  [facility] Procedure: 2D Echo,  Color Doppler and Cardiac Doppler Indications:     R78.81 Bacteremia  History:         Patient has prior history of Echocardiogram examinations, most                  recent 06/22/2019. ICM and CHF, Pacemaker, CKD; Risk                  Factors:Hypertension.  Sonographer:     Charmayne Sheer RDCS (AE) Referring Phys:  [id number] Sharen Hones Diagnosing Phys: Serafina Royals MD IMPRESSIONS  1. Left ventricular ejection fraction, by estimation, is 55 to 60%. The left ventricle has normal function. The left ventricle has no regional wall motion abnormalities. Left ventricular diastolic parameters were normal.  2. Right ventricular systolic function is normal. The right ventricular size is normal. There is moderately elevated pulmonary artery systolic pressure.  3. Left atrial size was mildly dilated.  4. Right atrial size was mildly dilated.  5. The mitral valve is normal in structure. Moderate mitral valve regurgitation.  6. Tricuspid valve regurgitation is moderate.  7. The aortic valve is normal in structure. Aortic valve regurgitation is trivial. FINDINGS  Left Ventricle: Left ventricular ejection fraction, by estimation, is 55 to 60%. The left ventricle has normal function. The left ventricle has no regional wall motion abnormalities. The left ventricular internal cavity size was normal in size. There is  no left ventricular hypertrophy. Left ventricular diastolic parameters were normal. Right Ventricle: The right ventricular size is normal. No increase in right ventricular wall thickness. Right ventricular systolic function is normal. There is moderately elevated pulmonary artery systolic pressure. The tricuspid regurgitant velocity is 3.12 m/s, and with an assumed right atrial pressure of 10 mmHg, the estimated right ventricular systolic pressure is 52.7 mmHg. Left Atrium: Left atrial size was mildly dilated. Right Atrium: Right atrial size was mildly dilated. Pericardium: There is no evidence of pericardial effusion.  Mitral Valve: The mitral valve is normal in structure. Moderate mitral valve regurgitation. MV  peak gradient, 7.3 mmHg. The mean mitral valve gradient is 2.0 mmHg. Tricuspid Valve: The tricuspid valve is normal in structure. Tricuspid valve regurgitation is moderate. Aortic Valve: The aortic valve is normal in structure. Aortic valve regurgitation is trivial. Aortic valve mean gradient measures 5.0 mmHg. Aortic valve peak gradient measures 11.2 mmHg. Aortic valve area, by VTI measures 2.80 cm. Pulmonic Valve: The pulmonic valve was normal in structure. Pulmonic valve regurgitation is trivial. Aorta: The aortic root and ascending aorta are structurally normal, with no evidence of dilitation. IAS/Shunts: No atrial level shunt detected by color flow Doppler.  LEFT VENTRICLE PLAX 2D LVIDd:         6.16 cm      Diastology LVIDs:         5.53 cm      LV e' lateral:   13.40 cm/s LV PW:         1.11 cm      LV E/e' lateral: 9.1 LV IVS:        0.88 cm      LV e' medial:    7.29 cm/s LVOT diam:     2.30 cm      LV E/e' medial:  16.8 LV SV:         77 LV SV Index:   35 LVOT Area:     4.15 cm  LV Volumes (MOD) LV vol d, MOD A2C: 163.0 ml LV vol d, MOD A4C: 167.0 ml LV vol s, MOD A2C: 74.6 ml LV vol s, MOD A4C: 77.1 ml LV SV MOD A2C:     88.4 ml LV SV MOD A4C:     167.0 ml LV SV MOD BP:      98.1 ml RIGHT VENTRICLE RV Basal diam:  4.69 cm LEFT ATRIUM              Index       RIGHT ATRIUM           Index LA diam:        6.20 cm  2.83 cm/m  RA Area:     34.40 cm LA Vol (A2C):   109.0 ml 49.81 ml/m RA Volume:   123.00 ml 56.20 ml/m LA Vol (A4C):   130.0 ml 59.40 ml/m LA Biplane Vol: 119.0 ml 54.38 ml/m  AORTIC VALVE                    PULMONIC VALVE AV Area (Vmax):    2.59 cm     PV Vmax:       1.14 m/s AV Area (Vmean):   2.74 cm     PV Vmean:      80.900 cm/s AV Area (VTI):     2.80 cm     PV VTI:        0.252 m AV Vmax:           167.00 cm/s  PV Peak grad:  5.2 mmHg AV Vmean:          103.000 cm/s PV Mean grad:  3.0  mmHg AV VTI:            0.276 m AV Peak Grad:      11.2 mmHg AV Mean Grad:      5.0 mmHg LVOT Vmax:         104.00 cm/s LVOT Vmean:        67.900 cm/s LVOT VTI:          0.186 m LVOT/AV VTI  ratio: 0.67  AORTA Ao Root diam: 4.20 cm MITRAL VALVE                TRICUSPID VALVE MV Area (PHT): 3.94 cm     TR Peak grad:   38.9 mmHg MV Peak grad:  7.3 mmHg     TR Vmax:        312.00 cm/s MV Mean grad:  2.0 mmHg MV Vmax:       1.35 m/s     SHUNTS MV Vmean:      61.1 cm/s    Systemic VTI:  0.19 m MV Decel Time: 193 msec     Systemic Diam: 2.30 cm MV E velocity: 122.50 cm/s Serafina Royals MD Electronically signed by Serafina Royals MD Signature Date/Time: 08/21/2019/5:42:42 PM    Final    ECHO TEE  Result Date: 08/23/2019    TRANSESOPHOGEAL ECHO REPORT   Patient Name:   Thomas [name] Date of Exam: 08/23/2019 Medical Rec #:  [id number]      Height:       74.0 in Accession #:    [id number]     Weight:       203.3 lb Date of Birth:  [birth year]     BSA:          2.188 m Patient Age:    85 years       BP:           108/62 mmHg Patient Gender: M              HR:           58 bpm. Exam Location:  [facility] Procedure: Transesophageal Echo, Cardiac Doppler and Color Doppler Indications:     Not listed on order  History:         Patient has prior history of Echocardiogram examinations, most                  recent 08/21/2019. CAD, Prior CABG and AICD; Risk                  Factors:Hypertension. PAF.  Sonographer:     Sherrie Sport RDCS (AE) Referring Phys:  [id number] Kate Sable Diagnosing Phys: Kate Sable MD PROCEDURE: The transesophogeal probe was passed without difficulty through the esophogus of the patient. Sedation performed by performing physician. The patient developed no complications during the procedure. IMPRESSIONS  1. Left ventricular ejection fraction, by estimation, is 55 to 60%. The left ventricle has normal function. The left ventricle has no regional wall motion abnormalities.  2. Right ventricular systolic function  is normal. The right ventricular size is normal.  3. Left atrial size was mild to moderately dilated. No left atrial/left atrial appendage thrombus was detected.  4. Right atrial size was mildly dilated.  5. The mitral valve is normal in structure. Mild mitral valve regurgitation.  6. Tricuspid valve regurgitation is moderate to severe.  7. The aortic valve is tricuspid. Aortic valve regurgitation is mild. Conclusion(s)/Recommendation(s): No evidence of vegetation/infective endocarditis on this transesophageal echocardiogram. FINDINGS  Left Ventricle: Left ventricular ejection fraction, by estimation, is 55 to 60%. The left ventricle has normal function. The left ventricle has no regional wall motion abnormalities. The left ventricular internal cavity size was normal in size. There is  no left ventricular hypertrophy. Right Ventricle: The right ventricular size is normal. No increase in right ventricular wall thickness. Right ventricular systolic function is normal. Left Atrium: Left atrial size was mild to moderately  dilated. No left atrial/left atrial appendage thrombus was detected. Right Atrium: Right atrial size was mildly dilated. Pericardium: There is no evidence of pericardial effusion. Mitral Valve: The mitral valve is normal in structure. Mild mitral valve regurgitation. Tricuspid Valve: The tricuspid valve is grossly normal. Tricuspid valve regurgitation is moderate to severe. Aortic Valve: The aortic valve is tricuspid. Aortic valve regurgitation is mild. Pulmonic Valve: The pulmonic valve was not well visualized. Pulmonic valve regurgitation is trivial. Aorta: The aortic root is normal in size and structure. Venous: The inferior vena cava was not well visualized. IAS/Shunts: No atrial level shunt detected by color flow Doppler. Additional Comments: A pacer wire is visualized. Kate Sable MD Electronically signed by Kate Sable MD Signature Date/Time: 08/23/2019/4:10:21 PM    Final    CUP  PACEART REMOTE DEVICE CHECK  Result Date: 08/16/2019 Scheduled remote reviewed. Normal device function.  Battery estimated 1 month to ERI, wireless transmitting. Next remote 91 days. [employer]  VAS [place] UPPER EXTREMITY VENOUS DUPLEX  Result Date: 08/26/2019 UPPER VENOUS STUDY  Indications: Swelling Performing Technologist: June Leap RDMS, RVT  Examination Guidelines: A complete evaluation includes B-mode imaging, spectral Doppler, color Doppler, and power Doppler as needed of all accessible portions of each vessel. Bilateral testing is considered an integral part of a complete examination. Limited examinations for reoccurring indications may be performed as noted.  Right Findings: +-----+------------+---------+-----------+----------+--------------+ RIGHTCompressiblePhasicitySpontaneousProperties   Summary     +-----+------------+---------+-----------+----------+--------------+ IJV                                            Not visualized +-----+------------+---------+-----------+----------+--------------+  Left Findings: +----------+------------+---------+-----------+----------+--------------------+ LEFT      CompressiblePhasicitySpontaneousProperties      Summary        +----------+------------+---------+-----------+----------+--------------------+ IJV           Full       Yes       Yes                                   +----------+------------+---------+-----------+----------+--------------------+ Subclavian                                             Not visualized    +----------+------------+---------+-----------+----------+--------------------+ Axillary      Full       Yes       Yes                                   +----------+------------+---------+-----------+----------+--------------------+ Brachial      Full       Yes       Yes                                   +----------+------------+---------+-----------+----------+--------------------+ Radial         Full                                                       +----------+------------+---------+-----------+----------+--------------------+  Ulnar         Full                                                       +----------+------------+---------+-----------+----------+--------------------+ Cephalic      None                                     Acute thrombus                                                              forearm        +----------+------------+---------+-----------+----------+--------------------+ Basilic       Full                                                       +----------+------------+---------+-----------+----------+--------------------+  Summary:  Left: No evidence of deep vein thrombosis in the upper extremity. Findings consistent with acute superficial vein thrombosis involving the left cephalic vein.  *See table(s) above for measurements and observations.  Diagnosing physician: Monica Martinez MD Electronically signed by Monica Martinez MD on 08/26/2019 at 12:49:19 PM.    Final    [place] EKG SITE RITE  Result Date: 08/27/2019 If Site Rite image not attached, placement could not be confirmed due to current cardiac rhythm.  [place] EKG SITE RITE  Result Date: 08/25/2019 If Site Rite image not attached, placement could not be confirmed due to current cardiac rhythm.      The results of significant diagnostics from this hospitalization (including imaging, microbiology, ancillary and laboratory) are listed below for reference.     Microbiology: Recent Results (from the past 240 hour(s))  SARS CORONAVIRUS 2 (TAT 6-24 HRS) Nasopharyngeal Nasopharyngeal Swab     Status: None   Collection Time: 09/08/19  5:40 PM   Specimen: Nasopharyngeal Swab  Result Value Ref Range Status   SARS Coronavirus 2 NEGATIVE NEGATIVE Final    Comment: (NOTE) SARS-CoV-2 target nucleic acids are NOT DETECTED.  The SARS-CoV-2 RNA is generally detectable in upper and  lower respiratory specimens during the acute phase of infection. Negative results do not preclude SARS-CoV-2 infection, do not rule out co-infections with other pathogens, and should not be used as the sole basis for treatment or other patient management decisions. Negative results must be combined with clinical observations, patient history, and epidemiological information. The expected result is Negative.  Fact Sheet for Patients: SugarRoll.be  Fact Sheet for Healthcare Providers: https://www.woods-mathews.com/  This test is not yet approved or cleared by the [place] FDA and  has been authorized for detection and/or diagnosis of SARS-CoV-2 by FDA under an Emergency Use Authorization (EUA). This EUA will remain  in effect (meaning this test can be used) for the duration of the COVID-19 declaration under Se ction 564(b)(1) of the Act, 21 U.S.C. section 360bbb-3(b)(1), unless the authorization is terminated or revoked sooner.  Performed at [facility], [place] [address], [place], [place] [address]      Labs: BNP (last 3 results) Recent Labs    08/18/19 1353 08/24/19 2100 09/05/19 0500  BNP 588.8* 480.2* 841.3*   Basic Metabolic Panel: Recent Labs  Lab 09/07/19 0134 09/08/19 0500 09/09/19 0400 09/10/19 0410 09/11/19 0312  NA 139 137 139 140 141  K 3.7 3.6 3.3* 3.6 3.7  CL 102 99 97* 99 101  CO2 31 31 32 33* 32  GLUCOSE 93 94 92 104* 99  BUN '13 13 13 13 15  ' CREATININE 0.89 1.02 0.92 0.98 0.96  CALCIUM 7.9* 7.7* 7.6* 8.0* 8.2*  MG 1.8 1.8 1.7 2.0 1.8  PHOS 3.1  --  3.2 2.7 2.6   Liver Function Tests: Recent Labs  Lab 09/07/19 0134 09/09/19 0400 09/10/19 0410 09/11/19 0312  ALBUMIN 1.4* 1.4* 2.0* 2.0*   No results for input(s): LIPASE, AMYLASE in the last 168 hours. No results for input(s): AMMONIA in the last 168 hours. CBC: Recent Labs  Lab 09/06/19 0441 09/06/19 0441 09/07/19 0134 09/07/19 0134  09/08/19 0500 09/09/19 0400 09/10/19 0410 09/10/19 1605 09/11/19 0313  WBC 4.9  --  4.8  --   --   --  4.3  --  5.1  HGB 7.6*   < > 7.9*   < > 8.0* 7.8* 7.5* 8.4* 8.7*  HCT 25.4*   < > 26.3*   < > 26.3* 26.1* 25.0* 27.7* 28.4*  MCV 87.0  --  85.9  --   --   --  87.7  --  86.1  PLT 176  --  186  --   --   --  166  --  196   < > = values in this interval not displayed.   Cardiac Enzymes: No results for input(s): CKTOTAL, CKMB, CKMBINDEX, TROPONINI in the last 168 hours. BNP: Invalid input(s): POCBNP CBG: Recent Labs  Lab 09/08/19 0606 09/09/19 0457 09/09/19 0745 09/10/19 0426 09/11/19 0600  GLUCAP 86 94 101* 125* 95   D-Dimer No results for input(s): DDIMER in the last 72 hours. Hgb A1c No results for input(s): HGBA1C in the last 72 hours. Lipid Profile No results for input(s): CHOL, HDL, LDLCALC, TRIG, CHOLHDL, LDLDIRECT in the last 72 hours. Thyroid function studies No results for input(s): TSH, T4TOTAL, T3FREE, THYROIDAB in the last 72 hours.  Invalid input(s): FREET3 Anemia work up No results for input(s): VITAMINB12, FOLATE, FERRITIN, TIBC, IRON, RETICCTPCT in the last 72 hours. Urinalysis    Component Value Date/Time   COLORURINE YELLOW (A) 08/20/2019 0924   APPEARANCEUR HAZY (A) 08/20/2019 0924   APPEARANCEUR Clear 12/13/2017 0918   LABSPEC 1.015 08/20/2019 0924   LABSPEC 1.021 11/19/2011 1501   PHURINE 5.0 08/20/2019 0924   GLUCOSEU NEGATIVE 08/20/2019 0924   GLUCOSEU Negative 11/19/2011 1501   HGBUR NEGATIVE 08/20/2019 0924   BILIRUBINUR NEGATIVE 08/20/2019 0924   BILIRUBINUR Negative 12/13/2017 0918   BILIRUBINUR Negative 11/19/2011 1501   KETONESUR NEGATIVE 08/20/2019 0924   PROTEINUR NEGATIVE 08/20/2019 0924   NITRITE NEGATIVE 08/20/2019 0924   LEUKOCYTESUR NEGATIVE 08/20/2019 0924   LEUKOCYTESUR Negative 11/19/2011 1501   Sepsis Labs Invalid input(s): PROCALCITONIN,  WBC,  LACTICIDVEN   Time coordinating discharge: 45  minutes  SIGNED:  Mercy Riding, MD  [facility] 09/11/2019, 10:58 AM  If 7PM-7AM, please contact night-coverage [email or web address] Password [email or web address]

## 2019-09-11 NOTE — Plan of Care (Signed)

## 2019-09-11 NOTE — Telephone Encounter (Signed)
Patient wife [name] [facility]) called. Informed me that the patient is currently in progressive care at [facility] and the patient will not be able to attend the follow-up apt. scheduled for 09/14/19. Advises that patient will be in rehab and will be there for about 20 days. Advised wife that I called Renee, PA to inform her of patient currently in hospital and if she could see patient for wound/device check d/t patient not being able to make it for in-clinic apt.

## 2019-09-11 NOTE — TOC Progression Note (Signed)
Transition of Care [facility]) - Progression Note    Patient Details  Name: Thomas [name] MRN: [id number] Date of Birth: 04-01-1934  [facility] CM/SW [place], [place] Phone Number: 09/11/2019, 11:44 AM  Clinical Narrative:    CSW met with patient, patient's spouse and interpreter Amy at bedside.  Pt's spouse prefers [facility] however is open for SNF placement at [facility]. CSW updated physician.  CSW will continue to follow for disposition planning.   Expected Discharge Plan: [place] Barriers to Discharge: Continued Medical Work up  Expected Discharge Plan and Services Expected Discharge Plan: [place] arrangements for the past 2 months: Single Family Home Expected Discharge Date: 09/11/19                         HH Arranged: RN, PT, OT HH Agency: [facility] Date [facility] Agency Contacted: 09/09/19 Time [place]: [place] Representative spoke with at [place]: [place] ([place]) Interventions    Readmission Risk Interventions Readmission Risk Prevention Plan 08/21/2019 06/23/2019 06/20/2019  Transportation Screening Complete Complete Complete  PCP or Specialist Appt within 3-5 Days - Complete -  HRI or [place] - Complete Complete  Social Work Consult for [place] Planning/Counseling - Complete -  Palliative Care Screening - Not Applicable Not Applicable  Medication Review Press photographer) Complete Complete Complete  PCP or Specialist appointment within 3-5 days of discharge Complete - -  [place] or Home Care Consult Complete - -  [place] Complete - -  Some recent data might be hidden

## 2019-09-11 NOTE — Progress Notes (Signed)
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 ICD removal on 6/11 with temp pacer placed. Multiple tooth extractions 6/16. Pacer implant 6/18. PMHx: CAD, ICD, HF, CKD, AFib, prostate CA, sepsis, CABG, ICM    PT Comments    Pt with slow progress due to continued decr activity tolerance. Pt incontinent of stool and urine during session and too fatigued to amb after cleaned up.    Follow Up Recommendations  SNF;Supervision/Assistance - 24 hour     Equipment Recommendations  Other (comment) (rollator)    Recommendations for Other Services       Precautions / Restrictions Precautions Precautions: ICD/Pacemaker;Fall Precaution Comments: for RUE. LUE had pacer removed    Mobility  Bed Mobility Overal bed mobility: Needs Assistance Bed Mobility: Supine to Sit;Sit to Supine     Supine to sit: Min assist;HOB elevated Sit to supine: Min assist   General bed mobility comments: Assist to elevate trunk into sitting and assist to bring feet back up into bed.  Transfers Overall transfer level: Needs assistance Equipment used: 4-wheeled walker Transfers: Sit to/from [facility] Sit to Stand: Min assist Stand pivot transfers: Min assist       General transfer comment: Assist to bring hips up and for balance. Bed to bsc to bed with rollator.   Ambulation/Gait             General Gait Details: Unable due to pt incontinent of stool and urine and fatigued after being cleaned   Stairs             Wheelchair Mobility    Modified Rankin (Stroke Patients Only)       Balance Overall balance assessment: Needs assistance Sitting-balance support: No upper extremity supported;Feet supported Sitting  balance-Leahy Scale: Fair     Standing balance support: Bilateral upper extremity supported;During functional activity Standing balance-Leahy Scale: Poor Standing balance comment: rollator and min guard for static standing                            Cognition Arousal/Alertness: Awake/alert Behavior During Therapy: WFL for tasks assessed/performed Overall Cognitive Status: Impaired/Different from baseline Area of Impairment: Memory;Problem solving                     Memory: Decreased short-term memory       Problem Solving: Requires verbal cues General Comments: Needs cues for ICD/pacer precautions      Exercises      General Comments        Pertinent Vitals/Pain Pain Assessment: Faces Faces Pain Scale: No hurt    Home Living                      Prior Function            PT Goals (current goals can now be found in the care plan section) Acute Rehab PT Goals Patient Stated Goal: to go home Progress towards PT goals: Not progressing toward goals - comment    Frequency    Min 2X/week      PT Plan Current plan remains appropriate;Discharge plan needs to be updated    Co-evaluation  AM-PAC PT "6 Clicks" Mobility   Outcome Measure  Help needed turning from your back to your side while in a flat bed without using bedrails?: A Little Help needed moving from lying on your back to sitting on the side of a flat bed without using bedrails?: A Little Help needed moving to and from a bed to a chair (including a wheelchair)?: A Little Help needed standing up from a chair using your arms (e.g., wheelchair or bedside chair)?: A Little Help needed to walk in hospital room?: Total Help needed climbing 3-5 steps with a railing? : Total 6 Click Score: 14    End of Session   Activity Tolerance: Patient limited by fatigue Patient left: with call bell/phone within reach;in bed;with bed alarm set Nurse Communication:  Mobility status PT Visit Diagnosis: History of falling (Z91.81);Difficulty in walking, not elsewhere classified (R26.2);Muscle weakness (generalized) (M62.81);Unsteadiness on feet (R26.81)     Time: 5009-3818 PT Time Calculation (min) (ACUTE ONLY): 37 min  Charges:  $Therapeutic Activity: 23-37 mins                     [place] Pager [phone] Office [place] 09/11/2019, 5:02 PM

## 2019-09-11 NOTE — Plan of Care (Signed)
°  Problem: Health Behavior/Discharge Planning: Goal: Ability to manage health-related needs will improve Outcome: Progressing   Problem: Clinical Measurements: Goal: Ability to maintain clinical measurements within normal limits will improve Outcome: Progressing Goal: Will remain free from infection Outcome: Progressing Goal: Diagnostic test results will improve Outcome: Progressing Goal: Respiratory complications will improve Outcome: Progressing Goal: Cardiovascular complication will be avoided Outcome: Progressing   Problem: Activity: Goal: Risk for activity intolerance will decrease Outcome: Progressing   Problem: Nutrition: Goal: Adequate nutrition will be maintained Outcome: Progressing   Problem: Coping: Goal: Level of anxiety will decrease Outcome: Progressing   Problem: Elimination: Goal: Will not experience complications related to bowel motility Outcome: Progressing Goal: Will not experience complications related to urinary retention Outcome: Progressing   Problem: Pain Managment: Goal: General experience of comfort will improve Outcome: Progressing   Problem: Safety: Goal: Ability to remain free from injury will improve Outcome: Progressing   Problem: Skin Integrity: Goal: Risk for impaired skin integrity will decrease Outcome: Progressing   Problem: Cardiovascular: Goal: Ability to achieve and maintain adequate cardiovascular perfusion will improve Outcome: Progressing Goal: Vascular access site(s) Level 0-1 will be maintained Outcome: Progressing   Problem: Education: Goal: Knowledge of cardiac device and self-care will improve Outcome: Progressing Goal: Ability to safely manage health related needs after discharge will improve Outcome: Progressing Goal: Individualized Educational Video(s) Outcome: Progressing   Problem: Cardiac: Goal: Ability to achieve and maintain adequate cardiopulmonary perfusion will improve Outcome: Progressing

## 2019-09-12 ENCOUNTER — Inpatient Hospital Stay (HOSPITAL_COMMUNITY): Payer: PPO

## 2019-09-12 DIAGNOSIS — E7800 Pure hypercholesterolemia, unspecified: Secondary | ICD-10-CM | POA: Diagnosis not present

## 2019-09-12 DIAGNOSIS — J811 Chronic pulmonary edema: Secondary | ICD-10-CM | POA: Diagnosis not present

## 2019-09-12 DIAGNOSIS — J90 Pleural effusion, not elsewhere classified: Secondary | ICD-10-CM | POA: Diagnosis not present

## 2019-09-12 DIAGNOSIS — K31819 Angiodysplasia of stomach and duodenum without bleeding: Secondary | ICD-10-CM | POA: Diagnosis not present

## 2019-09-12 DIAGNOSIS — J189 Pneumonia, unspecified organism: Secondary | ICD-10-CM | POA: Diagnosis not present

## 2019-09-12 DIAGNOSIS — R0902 Hypoxemia: Secondary | ICD-10-CM | POA: Diagnosis not present

## 2019-09-12 DIAGNOSIS — R5381 Other malaise: Secondary | ICD-10-CM | POA: Diagnosis not present

## 2019-09-12 DIAGNOSIS — R7989 Other specified abnormal findings of blood chemistry: Secondary | ICD-10-CM | POA: Diagnosis not present

## 2019-09-12 DIAGNOSIS — Z79899 Other long term (current) drug therapy: Secondary | ICD-10-CM | POA: Diagnosis not present

## 2019-09-12 DIAGNOSIS — M2550 Pain in unspecified joint: Secondary | ICD-10-CM | POA: Diagnosis not present

## 2019-09-12 DIAGNOSIS — A419 Sepsis, unspecified organism: Secondary | ICD-10-CM | POA: Diagnosis not present

## 2019-09-12 DIAGNOSIS — N179 Acute kidney failure, unspecified: Secondary | ICD-10-CM | POA: Diagnosis not present

## 2019-09-12 DIAGNOSIS — Z743 Need for continuous supervision: Secondary | ICD-10-CM | POA: Diagnosis not present

## 2019-09-12 DIAGNOSIS — M6259 Muscle wasting and atrophy, not elsewhere classified, multiple sites: Secondary | ICD-10-CM | POA: Diagnosis not present

## 2019-09-12 DIAGNOSIS — R1312 Dysphagia, oropharyngeal phase: Secondary | ICD-10-CM | POA: Diagnosis not present

## 2019-09-12 DIAGNOSIS — D500 Iron deficiency anemia secondary to blood loss (chronic): Secondary | ICD-10-CM | POA: Diagnosis not present

## 2019-09-12 DIAGNOSIS — M6281 Muscle weakness (generalized): Secondary | ICD-10-CM | POA: Diagnosis not present

## 2019-09-12 DIAGNOSIS — B951 Streptococcus, group B, as the cause of diseases classified elsewhere: Secondary | ICD-10-CM | POA: Diagnosis not present

## 2019-09-12 DIAGNOSIS — R279 Unspecified lack of coordination: Secondary | ICD-10-CM | POA: Diagnosis not present

## 2019-09-12 DIAGNOSIS — D649 Anemia, unspecified: Secondary | ICD-10-CM | POA: Diagnosis not present

## 2019-09-12 DIAGNOSIS — A401 Sepsis due to streptococcus, group B: Secondary | ICD-10-CM | POA: Diagnosis not present

## 2019-09-12 DIAGNOSIS — J9811 Atelectasis: Secondary | ICD-10-CM | POA: Diagnosis not present

## 2019-09-12 DIAGNOSIS — R41841 Cognitive communication deficit: Secondary | ICD-10-CM | POA: Diagnosis not present

## 2019-09-12 DIAGNOSIS — J9601 Acute respiratory failure with hypoxia: Secondary | ICD-10-CM | POA: Diagnosis not present

## 2019-09-12 DIAGNOSIS — I255 Ischemic cardiomyopathy: Secondary | ICD-10-CM | POA: Diagnosis not present

## 2019-09-12 DIAGNOSIS — H9190 Unspecified hearing loss, unspecified ear: Secondary | ICD-10-CM | POA: Diagnosis not present

## 2019-09-12 DIAGNOSIS — R7881 Bacteremia: Secondary | ICD-10-CM | POA: Diagnosis not present

## 2019-09-12 DIAGNOSIS — E8809 Other disorders of plasma-protein metabolism, not elsewhere classified: Secondary | ICD-10-CM | POA: Diagnosis not present

## 2019-09-12 DIAGNOSIS — I10 Essential (primary) hypertension: Secondary | ICD-10-CM | POA: Diagnosis not present

## 2019-09-12 LAB — CBC
HCT: 28.6 % — ABNORMAL LOW (ref 39.0–52.0)
Hemoglobin: 8.8 g/dL — ABNORMAL LOW (ref 13.0–17.0)
MCH: 26.6 pg (ref 26.0–34.0)
MCHC: 30.8 g/dL (ref 30.0–36.0)
MCV: 86.4 fL (ref 80.0–100.0)
Platelets: 218 10*3/uL (ref 150–400)
RBC: 3.31 MIL/uL — ABNORMAL LOW (ref 4.22–5.81)
RDW: 22 % — ABNORMAL HIGH (ref 11.5–15.5)
WBC: 5.4 10*3/uL (ref 4.0–10.5)
nRBC: 0 % (ref 0.0–0.2)

## 2019-09-12 LAB — RENAL FUNCTION PANEL
Albumin: 1.9 g/dL — ABNORMAL LOW (ref 3.5–5.0)
Anion gap: 7 (ref 5–15)
BUN: 16 mg/dL (ref 8–23)
CO2: 33 mmol/L — ABNORMAL HIGH (ref 22–32)
Calcium: 8.3 mg/dL — ABNORMAL LOW (ref 8.9–10.3)
Chloride: 103 mmol/L (ref 98–111)
Creatinine, Ser: 1 mg/dL (ref 0.61–1.24)
GFR calc Af Amer: >60 mL/min (ref >60)
GFR calc non Af Amer: >60 mL/min (ref >60)
Glucose, Bld: 95 mg/dL (ref 70–99)
Phosphorus: 3 mg/dL (ref 2.5–4.6)
Potassium: 4 mmol/L (ref 3.5–5.1)
Sodium: 143 mmol/L (ref 135–145)

## 2019-09-12 LAB — MAGNESIUM: Magnesium: 2.1 mg/dL (ref 1.7–2.4)

## 2019-09-12 LAB — SARS CORONAVIRUS 2 (TAT 6-24 HRS): SARS Coronavirus 2: NEGATIVE

## 2019-09-12 MED ORDER — PANTOPRAZOLE SODIUM 40 MG PO TBEC: DELAYED_RELEASE_TABLET | ORAL | 0 refills | Status: DC

## 2019-09-12 MED ORDER — CEFTRIAXONE IV (FOR PTA / DISCHARGE USE ONLY): 2.0000 g | INTRAVENOUS | 0 refills | Status: AC

## 2019-09-12 MED ORDER — ONDANSETRON HCL 4 MG PO TABS: 4.0000 mg | ORAL_TABLET | Freq: Three times a day (TID) | ORAL | 1 refills | Status: DC | PRN

## 2019-09-12 NOTE — TOC Progression Note (Signed)
Transition of Care [facility]) - Progression Note    Patient Details  Name: KALIK HOARE MRN: [id number] Date of Birth: 04/29/1934  Transition of Care [facility]) CM/SW [place], [place] Phone Number: 09/12/2019, 11:07 AM  Clinical Narrative:     CSW contacted [facility] was informed they had rescind their bed offer yesterday and informed covering CSW. CSW met patient at bedside, along with his wife ( and Mark,interpreter) to updated on SNF bed status and the SNF process. CSW explained will contact [facility] to and request they review for possible admission- waiting on response. CSW advised received authorization for SNF but not for PTAR. Patient's wife expressed concerns about cost for PTAR. CSW explain they will be billed for PTAR  at a later date, once they receive the bill they can contact number on billing to discuss payment plan. Patient and wife states they understand. NO further questions or concerns expressed at this time.   CSW reached out to [facility]- requested they review and let CSW know if they accept patient. Informed patient is medically ready for discharge. Waiting on response.  Thurmond Butts, MSW, [place] Clinical Social Worker   Expected Discharge Plan: Skilled Nursing Facility Barriers to Discharge: Continued Medical Work up  Expected Discharge Plan and Services Expected Discharge Plan: [place] arrangements for the past 2 months: [place] Expected Discharge Date: 09/12/19                         HH Arranged: RN, PT, OT HH Agency: [facility] Date [place]: 09/09/19 Time [place]: [place] Representative spoke with at [place]: [place] ([place]) Interventions    Readmission Risk Interventions Readmission Risk Prevention Plan 08/21/2019 06/23/2019 06/20/2019  Transportation Screening Complete Complete Complete  PCP or  Specialist Appt within 3-5 Days - Complete -  HRI or [place] - Complete Complete  Social Work Consult for [place] Planning/Counseling - Complete -  Palliative Care Screening - Not Applicable Not Applicable  Medication Review Press photographer) Complete Complete Complete  PCP or Specialist appointment within 3-5 days of discharge Complete - -  [place] or Home Care Consult Complete - -  [place] Complete - -  Some recent data might be hidden

## 2019-09-12 NOTE — Progress Notes (Addendum)
Patient seen for device wound check visit.  S/p CRT-D system extraction 08/25/2019 L chest.  Sutures remain in place. The pocket is fluid filled, very soft. Not tender, no erythema, not warm, mild ecchymosis, skin is intact Wound edges are well healed Image was taken and sent to Dr. [name]. Advised to remove sutures.  No other intervention needed, will reabsorb over time, no additional wound check/care needed for this. All sutures were removed without diffculty  New CRT-D implanted 09/01/19 R side Steri strips are removed without difficult Site looks good, no hematoma, erythema, heat or tenderness Skin edges are well approximated  Patient tolerated well.  Device was interrogated with stable findings, teaching was completed by MDT rep with the patient and wife, all her questions answered  Wife was at bedside Sign interpretor also present  Cardiology and EP follow up is in place  [place], [place]

## 2019-09-12 NOTE — Discharge Summary (Addendum)
Physician Discharge Summary  Thomas [name] OHY:[id number] DOB: 18-Nov-1934 DOA: 08/24/2019  PCP: [name], MD  Admit date: 08/24/2019 Discharge date: 09/12/2019  Admitted From: Home Disposition: SNF  Recommendations for Outpatient Follow-up:  1. Follow ups as below. 2. Please obtain CBC with differential, BMP and Mg weekly, and ESR and CRP biweekly 3. Please follow up on the following pending results: None   Discharge Condition: Stable CODE STATUS: Full code   Follow-up Information    Thomas [name], DDS Follow up.   Specialty: Dentistry Why: Call for F/U evaluation  Contact information: [place] [address] [phone]        Thomas Lance, MD Follow up on 12/04/2019.   Specialty: Cardiology Why: at 3 pm for 3 month pacermaker check Contact information: [address] [place] [address] [phone]        Thomas [name] [facility] Follow up on 09/14/2019.   Why: at 3 pm for post hospital pacer check Contact information: [place] [address] [phone]       Thomas Gala, MD Follow up in 2 week(s).   Specialty: Otolaryngology Contact information: [address] [place] [address] [phone]               Hospital Course: 85 year old male with impaired hearing, CAD, high-grade AVB s/p BiV-ICD, systolic CHF with recovered EF, CKD-3A, A. fib not on AC due to GI bleed/duodenal AVM, lymphedema, prostate cancer and recent group B strep bacteremia s/p 6 weeks of antibiotics admitted to [facility] after presenting with weakness and presyncope and found to have sepsis secondary to LLL pneumonia and recurrent group B strep bacteremia.  Heart TEE without evidence of vegetation on 08/23/2019.  ID at [facility] recommended transfer to [facility] for ICD removal given recurrent bacteremia.  Patient underwent extraction of  BiV-ICD system and insertion of new temporary transvenous pacemaker on 08/25/2019.  He had right-sided BiV-ICD insertion CRT-D on 6/18.  He also underwent dental extraction involving tooth #2, 28 and 31 with 2 quadrants of alveoloplasty.  ID recommended IV ceftriaxone through 09/22/2019.  Hospital course complicated by anemia requiring 4 units of blood transfusions throughout his hospitalization.  He had small bowel enteroscopy without significant pathology or finding. His hemoglobin remained stable prior to discharge.  Patient developed respiratory failure with hypoxia in the setting of pneumonia, pleural effusion, hypoalbuminemia and CHF exacerbation.  He was started on IV Lasix with significant improvement.  He eventually transitioned to torsemide 40 mg twice daily.  He will be discharged on torsemide 40 mg twice daily.  He is also on Aldactone and midodrine.  Therapy recommended SNF.   See individual problem list below for more on hospital course.  Discharge Diagnoses:  Septic shock likely secondary to group B streptococcus bacteremia-unclear source of bacteremia. -TEE without evidence of vegetation. -Blood cultures on 6/12 NGTD. -Hardware extraction and implantation as above. -IV ceftriaxone through 09/22/2019 per ID recommendation. -Weekly CBC with differential, BMP and magnesium.  -CRP and ESR biweekly  Chronic apical periodontitis/dental caries:  -s/p multiple dental extraction, 2 quadrants of alveoloplasty and gross debridement -Continue oral care with mouthwash -Out patient follow ups as above  Acute respiratory failure with hypoxia-multifactorial including pneumonia, pleural effusion and CHF exacerbation.  Liberated off oxygen.  -PCCM signed off. -Treat treatable causes as below. -Encourage incentive spirometry and OOB -Continue PT/OT  Bilateral pneumonia: CXR with RUL and RML complete  opacification.  Some overlap of pulmonary edema -On ceftriaxone, IV diuretics and breathing  treatments.  Acute on chronic systolic heart failure with recovered EF to 55%.  Patient with significant lymphedema likely chronic.    Has been doing well on torsemide 40 mg twice daily for the last 3-4 days.  Renal function and electrolytes stable. -EP/cardiology signed off. -Continue p.o. torsemide 40 mg twice daily -Continue Aldactone. Cozaar discontinued in the setting of soft blood pressures -Monitor fluid status with daily weight -Sodium and fluid restriction.  -Check renal function and magnesium in 1 week.  Hypotension: Improved. -Continue low-dose midodrine.  Started on 6/26.  Acute on chronic blood loss anemia with iron deficiency: Baseline Hgb 7-8> 7.4 (admit)> 6.7>2u> 8.5>7.6>1u> 8.0> 7.8> 7.5>1u> 8.4> 8.8.  Iron sat 7%.  TIBC 155.  Reports dark stool but he is on iron supplementations. -Enteroscopy negative. -PO Protonix 40 mg BID for two weeks followed by daily  -Continue iron supplementations with bowel regimen -Recheck CBC in about a week  Permanent A. Fib?  Atrial paced rhythm on EKG.  Not on AC due to GI bleed   CAD/CABG, DES: Stable. -Continue with statins  Essential hypertension: Normotensive for most part. -Diuretics on midodrine as above  AKI: Initially thought to have CKD.  Resolved. -Continue monitoring  Hypokalemia/hypomagnesemia: Resolved. -Recheck in 1 week  Soft mass upper nasal cavity B/L:  -ENT, Dr. Constance Holster consulted by previous attending recommended outpatient follow-up.  Debility/physical deconditioning -Continue therapy at SNF  Impaired hearing -Utilizing sign language interpreter  Hypoalbuminemia: Albumin 2.0 after IV albumin.  Prealbumin 9.2. -Would benefit from dietitian guidance at SNF. Body mass index is 27.37 kg/m. Nutrition Problem: Increased nutrient needs Etiology: chronic illness, post-op healing Signs/Symptoms: estimated needs Interventions: Ensure Enlive (each supplement provides 350kcal and 20 grams of protein),  MVI   Pressure Injury 06/21/19 Buttocks Mid Stage 2 -  Partial thickness loss of dermis presenting as a shallow open injury with a red, pink wound bed without slough. (Active)  06/21/19 0700  Location: Buttocks  Location Orientation: Mid  Staging: Stage 2 -  Partial thickness loss of dermis presenting as a shallow open injury with a red, pink wound bed without slough.  Wound Description (Comments):   Present on Admission:     Discharge Exam: Vitals:   09/12/19 0709 09/12/19 1035  BP:    Pulse:    Resp: 20   Temp: 98 F (36.7 C) 98 F (36.7 C)  SpO2: 94% 93%    GENERAL: Frail looking male.  No apparent distress.  Nontoxic. HEENT: MMM.  Vision grossly intact.  Impaired hearing. NECK: Supple.  No apparent JVD.  RESP: 94-96% on room air.  No IWOB.  Fair aeration but limited by poor inspiratory effort CVS:  RRR. Heart sounds normal.  ABD/GI/GU: Bowel sounds present. Soft. Non tender.  MSK/EXT:  Moves extremities. No apparent deformity.  1+ dependent edema in BUE.  1+ BLE edema SKIN: Some skin erythema in right groin and on his back NEURO: Awake, alert and oriented appropriately.  No apparent focal neuro deficit. PSYCH: Calm. Normal affect.  Discharge Instructions  Discharge Instructions    Anaphylaxis Kit: Provided to treat any anaphylactic reaction to the medication being provided to the patient if First Dose or when requested by physician   Complete by: As directed    Epinephrine 56m/ml vial / amp: Administer 0.338m(0.73m17msubcutaneously once for moderate to severe anaphylaxis, nurse to call physician and pharmacy when reaction occurs and call 911 if  needed for immediate care   Diphenhydramine 69m/ml IV vial: Administer 25-520mIV/IM PRN for first dose reaction, rash, itching, mild reaction, nurse to call physician and pharmacy when reaction occurs   Sodium Chloride 0.9% NS 50064mV: Administer if needed for hypovolemic blood pressure drop or as ordered by physician after call  to physician with anaphylactic reaction   Change dressing on IV access line weekly and PRN   Complete by: As directed    Diet - low sodium heart healthy   Complete by: As directed    Flush IV access with Sodium Chloride 0.9% and Heparin 10 units/ml or 100 units/ml   Complete by: As directed    Increase activity slowly   Complete by: As directed    No dressing needed   Complete by: As directed    Usual skin care to prevent pressure injury     Allergies as of 09/12/2019      Reactions   Entresto [sacubitril-valsartan] Swelling   And bruising of arm   Phenazopyridine Nausea Only, Other (See Comments)   GI UPSET   Ramipril Other (See Comments)   unk Other reaction(s): Other (See Comments), Unknown unk      Medication List    STOP taking these medications   cefTRIAXone 2 g in sodium chloride 0.9 % 100 mL   losartan 25 MG tablet Commonly known as: COZAAR   ondansetron 4 MG/2ML Soln injection Commonly known as: ZOFRAN Replaced by: ondansetron 4 MG tablet   pantoprazole 40 MG injection Commonly known as: PROTONIX Replaced by: pantoprazole 40 MG tablet     TAKE these medications   acetaminophen 325 MG tablet Commonly known as: TYLENOL Take 2 tablets (650 mg total) by mouth every 6 (six) hours as needed for mild pain or fever.   albuterol (2.5 MG/3ML) 0.083% nebulizer solution Commonly known as: PROVENTIL Inhale 3 mLs into the lungs every 4 (four) hours as needed for wheezing or shortness of breath.   cefTRIAXone  IVPB Commonly known as: ROCEPHIN Inject 2 g into the vein daily for 10 days. Indication:  Group B strep bacteremia with pacemaker   dextromethorphan-guaiFENesin 30-600 MG 12hr tablet Commonly known as: MUCINEX DM Take 1 tablet by mouth 2 (two) times daily as needed for cough.   ferrous sulfate 325 (65 FE) MG tablet Take 1 tablet (325 mg total) by mouth 2 (two) times daily with a meal. What changed: when to take this   Gerhardt's butt cream Crea Apply 1  application topically 2 (two) times daily.   ipratropium-albuterol 0.5-2.5 (3) MG/3ML Soln Commonly known as: DUONEB Take 3 mLs by nebulization every 6 (six) hours.   midodrine 5 MG tablet Commonly known as: PROAMATINE Take 1 tablet (5 mg total) by mouth 3 (three) times daily with meals.   multivitamin with minerals Tabs tablet Take 1 tablet by mouth daily.   ondansetron 4 MG tablet Commonly known as: Zofran Take 1 tablet (4 mg total) by mouth every 8 (eight) hours as needed for nausea or vomiting. Replaces: ondansetron 4 MG/2ML Soln injection   pantoprazole 40 MG tablet Commonly known as: PROTONIX Take 1 tablet (40 mg total) by mouth 2 (two) times daily for 14 days, THEN 1 tablet (40 mg total) daily. Start taking on: September 12, 2019 Replaces: pantoprazole 40 MG injection   polyethylene glycol 17 g packet Commonly known as: MIRALAX / GLYCOLAX Take 17 g by mouth daily as needed for mild constipation.   simvastatin 20 MG tablet Commonly known  as: ZOCOR TAKE 1 TABLET BY MOUTH EVERY DAY   spironolactone 25 MG tablet Commonly known as: ALDACTONE Take 1 tablet (25 mg total) by mouth daily.   torsemide 20 MG tablet Commonly known as: DEMADEX Take 2 tablets (40 mg total) by mouth 2 (two) times daily. What changed: when to take this            Durable Medical Equipment  (From admission, onward)         Start     Ordered   09/09/19 1243  For home use only DME Hospital bed  Once       Question Answer Comment  Length of Need Lifetime   The above medical condition requires: Patient requires the ability to reposition frequently   Head must be elevated greater than: 30 degrees   Bed type Semi-electric   Hoyer Lift Yes   Trapeze Bar Yes   Support Surface: Gel Overlay      09/09/19 1243           Discharge Care Instructions  (From admission, onward)         Start     Ordered   09/11/19 0000  Change dressing on IV access line weekly and PRN  (Home infusion  instructions - [facility] )        09/11/19 1045   09/11/19 0000  No dressing needed       Comments: Usual skin care to prevent pressure injury   09/11/19 1045          Consultations:  PCCM  Cardiology  Procedures/Studies: 6/7-small bowel enteroscopy without significant finding  6/8-TEE without vegetation  6/11-ICD extraction  6/16-dental extraction including tooth #2, 28, 31, and 2 quadrants of alveoloplasty  6/18-right-sided BiV-ICD insertion CRT-D on 6/18.  2D echo on 08/21/2019 1. Left ventricular ejection fraction, by estimation, is 55 to 60%. The  left ventricle has normal function. The left ventricle has no regional  wall motion abnormalities. Left ventricular diastolic parameters were  normal.  2. Right ventricular systolic function is normal. The right ventricular  size is normal. There is moderately elevated pulmonary artery systolic  pressure.  3. Left atrial size was mildly dilated.  4. Right atrial size was mildly dilated.  5. The mitral valve is normal in structure. Moderate mitral valve  regurgitation.  6. Tricuspid valve regurgitation is moderate.  7. The aortic valve is normal in structure. Aortic valve regurgitation is  trivial.    DG Chest 1 View  Result Date: 08/25/2019 CLINICAL DATA:  Pacemaker lead removal EXAM: DG C-ARM 1-60 MIN; CHEST  1 VIEW FLUOROSCOPY TIME:  Fluoroscopy Time: 27 minutes 15 seconds 716.09 mGy Number of Acquired Spot Images: 1 COMPARISON:  Chest radiograph August 24, 2019 FINDINGS: Frontal spot image over the left lower chest shows stable pacemaker leads attached to the right heart and coronary sinus region. Visualized heart appears enlarged. IMPRESSION: Stable appearing lead placement on frontal view.  Cardiomegaly. Electronically Signed   By: Lowella Grip III M.D.   On: 08/25/2019 19:15   DG Chest 1 View  Result Date: 08/24/2019 CLINICAL DATA:  Pneumonia EXAM: CHEST  1 VIEW COMPARISON:  08/18/2019  FINDINGS: Single frontal view of the chest demonstrates stable postsurgical changes from median sternotomy. Multi lead pacer again noted. Cardiac silhouette is enlarged. There is progressive central vascular congestion and bibasilar veiling opacities, left greater than right, consistent with airspace disease and effusions. No pneumothorax. IMPRESSION: 1. Worsening airspace disease, effusions, and central  vascular congestion, most consistent with congestive heart failure. Superimposed infection cannot be excluded. Electronically Signed   By: Randa Ngo M.D.   On: 08/24/2019 21:49   DG Chest 2 View  Result Date: 09/04/2019 CLINICAL DATA:  Pneumonia EXAM: CHEST - 2 VIEW COMPARISON:  09/02/2017 FINDINGS: Small bilateral pleural effusions. Bilateral diffuse interstitial thickening. Bibasilar hazy airspace disease. No pneumothorax. Stable cardiomegaly. Prior CABG. Cardiac pacemaker is noted. Thoracic aortic atherosclerosis. Severe osteoarthritis of the right glenohumeral joint. IMPRESSION: Cardiomegaly with bilateral pleural effusions and diffuse interstitial thickening. Differential considerations include pulmonary edema with bibasilar atelectasis versus pneumonia. Electronically Signed   By: Kathreen Devoid   On: 09/04/2019 11:15   DG Chest 2 View  Result Date: 09/02/2019 CLINICAL DATA:  Implantable cardioverter-defibrillator placement. EXAM: CHEST - 2 VIEW COMPARISON:  Chest radiograph dated 09/01/2019 FINDINGS: A right subclavian approach cardiac device is redemonstrated. Sternotomy hardware and surgical clips overlying the mediastinum and heart are redemonstrated. A left upper extremity peripherally inserted central venous catheter tip overlies the superior vena cava. The heart remains enlarged. Vascular calcifications are seen in the aortic arch. There is increased aeration of the right upper lung. Small bilateral pleural effusions with associated atelectasis/airspace disease appear similar to prior exam.  There is no pneumothorax. The visualized skeletal structures are unremarkable. IMPRESSION: Increased aeration of the right upper lung. Unchanged small bilateral pleural effusions with associated atelectasis/airspace disease. Electronically Signed   By: Zerita Boers M.D.   On: 09/02/2019 11:37   EP PPM/ICD IMPLANT  Result Date: 09/01/2019 Conclusion: Successful insertion of a new biventricular ICD and removal of the 2 previously implanted temporary transvenous pacing leads in a patient with complete heart block, chronic atrial fibrillation, chronic systolic heart failure in the setting of ischemic cardiomyopathy ejection fraction 30%.  Of note the patient's decision to place an ICD lead was made based on his severe tricuspid regurgitation and severe difficulty placing pacing leads at the time of his temporary permanent insertion during his extraction procedure. Cristopher Peru, MD  CT 3D RECON AT SCANNER  Result Date: 08/29/2019 CLINICAL DATA:  Nonspecific (abnormal) findings on radiological and other examination of musculoskeletal system. EXAM: 3-DIMENSIONAL CT IMAGE RENDERING ON ACQUISITION WORKSTATION TECHNIQUE: 3-dimensional CT images were rendered by post-processing of the original CT data on an acquisition workstation. The 3-dimensional CT images were interpreted and findings were reported in the accompanying complete CT report for this study COMPARISON:  None. FINDINGS: Refer to  CT maxillofacial 08/28/2019 IMPRESSION: Refer to CT maxillofacial 08/28/2019 Electronically Signed   By: Franchot Gallo M.D.   On: 08/29/2019 09:16   CT MAXILLOFACIAL W CONTRAST  Result Date: 08/28/2019 CLINICAL DATA:  Endocarditis suspected.  Rule out dental infection. EXAM: CT MAXILLOFACIAL WITH CONTRAST TECHNIQUE: Multidetector CT imaging of the maxillofacial structures was performed with intravenous contrast. Multiplanar CT image reconstructions were also generated. CONTRAST:  67m OMNIPAQUE IOHEXOL 300 MG/ML  SOLN  COMPARISON:  None. FINDINGS: Osseous: No facial fracture.  No acute skeletal abnormality. Numerous caries are present bilaterally. Extensive destruction of the right upper third molar with periapical lucency. Orbits: No orbital mass or edema.  Bilateral cataract extraction. Sinuses: Soft tissue masslike lesion in the upper nasal cavity measuring approximately 22 x 14 mm. This is on both sides of the septum. No destruction of the septum. Soft tissues: No soft tissue mass or adenopathy in the area of interest. Limited intracranial: Generalized atrophy. No acute intracranial abnormality. IMPRESSION: 1. Numerous caries bilaterally. Right upper third molar caries with periapical lucency.  2. Soft tissue mass in the upper nasal cavity bilaterally. Recommend direct visualization and biopsy if appropriate. Electronically Signed   By: Franchot Gallo M.D.   On: 08/28/2019 20:09   DG CHEST PORT 1 VIEW  Result Date: 09/12/2019 CLINICAL DATA:  At PICC line placement EXAM: PORTABLE CHEST 1 VIEW COMPARISON:  Portable exam 1553 hours compared to 09/06/2019 FINDINGS: LEFT arm PICC line with tip projecting over SVC. LEFT subclavian ICD with leads projecting over RIGHT ventricle and coronary sinus. Enlargement of cardiac silhouette post CABG. Atherosclerotic calcification aorta. Bibasilar effusions and atelectasis. Mild pulmonary vascular congestion and minimal perihilar edema. No pneumothorax. RIGHT glenohumeral degenerative changes seen. IMPRESSION: Enlargement of cardiac silhouette with vascular congestion and minimal pulmonary edema. Persistent bibasilar pleural effusions and atelectasis. Electronically Signed   By: Lavonia Dana M.D.   On: 09/12/2019 16:02   DG CHEST PORT 1 VIEW  Result Date: 09/06/2019 CLINICAL DATA:  Shortness of breath. Chronic bilateral pleural effusions. EXAM: PORTABLE CHEST 1 VIEW COMPARISON:  09/04/2019 and 09/03/2019 FINDINGS: There has been improvement in the haziness at both lung bases, probably  representing a combination of decreasing effusions and improved mild interstitial edema. Chronic cardiomegaly. Chronic pulmonary vascular congestion. AICD in place. Aortic atherosclerosis. CABG. IMPRESSION: Improving bilateral pleural effusions and interstitial edema. Electronically Signed   By: Lorriane Shire M.D.   On: 09/06/2019 09:41   DG Chest Port 1 View  Result Date: 09/03/2019 CLINICAL DATA:  Acute respiratory distress EXAM: PORTABLE CHEST 1 VIEW COMPARISON:  September 02, 2019 FINDINGS: The left PICC line is in good position. Stable AICD device. Stable cardiomegaly. The hila and mediastinum are unchanged. No pneumothorax. Bilateral pleural effusions with underlying opacities. Pulmonary edema. Increasing opacity in the right mid lung. IMPRESSION: 1. Increasing opacity in the right mid lung could represent atelectasis underlying layering effusion versus developing infiltrate. Recommend clinical correlation and attention on follow-up. 2. Layering pleural effusions with underlying atelectasis. 3. Cardiomegaly and mild edema. Electronically Signed   By: Dorise Bullion III M.D   On: 09/03/2019 11:38   DG Chest Port 1 View  Result Date: 09/01/2019 CLINICAL DATA:  ICD placement EXAM: PORTABLE CHEST 1 VIEW COMPARISON:  Radiograph 08/29/2019 FINDINGS: There is near complete opacification of the right mid to upper lung with few residual air lucencies which could reflect some aerated lung parenchyma or air bronchograms. Patchy opacity noted in the residually aerated portion of the right lower lobe. Large right pleural effusion is present. More small left pleural effusion with some posterior layering distribution. Postsurgical changes related to prior CABG including intact and aligned sternotomy hardware and multiple surgical clips projecting over the mediastinum. Stable cardiomegaly. The aorta is calcified. The remaining cardiomediastinal contours are unremarkable. Removal of the previously seen left chest wall  pacer pack with interval placement of a 2 lead pacer/defibrillator device overlying the right chest wall with leads at the right atrium and coronary sinus. Telemetry leads overlie the chest. IMPRESSION: 1. Removal of the previously seen left chest wall pacer device with placement of a right chest wall pacer/AICD with leads at the cardiac apex and coronary sinus. 2. Near complete opacification of the right mid to upper lung with few residual air lucencies which could reflect some aerated lung parenchyma or air bronchograms., suspect this may be part of the large right pleural effusion with atelectatic collapse though some underlying airspace process is not excluded particularly given some patchy opacity in the residually aerated portion of the right lower lobe. 3. Small left effusion  as well. 4. Stable cardiomegaly and features of prior CABG. Electronically Signed   By: [name] M.D.   On: 09/01/2019 20:26   DG CHEST PORT 1 VIEW  Result Date: 08/29/2019 CLINICAL DATA:  85 year old male status post ICD extraction. EXAM: PORTABLE CHEST 1 VIEW COMPARISON:  Chest x-ray 08/28/2019. FINDINGS: Left costophrenic sulcus incompletely imaged. Lung volumes are slightly low. Opacities are noted throughout the mid to lower lungs bilaterally, which may reflect areas of atelectasis and/or consolidation. Small bilateral pleural effusions. Cephalization of the pulmonary vasculature. Mild cardiomegaly. Upper mediastinal contours are distorted by patient's rotation to the left. Aortic atherosclerosis. Status post median sternotomy. Left-sided pacemaker device in place with lead tip projecting over the expected location of the right ventricle. Additional lead extending into the right-side of the heart via the inferior vena cava. IMPRESSION: 1. Postoperative changes and support apparatus, as above. 2. Persistent areas of atelectasis and/or consolidation throughout the mid to lower lungs bilaterally with small bilateral pleural  effusions. 3. Cardiomegaly. Electronically Signed   By: Vinnie Langton M.D.   On: 08/29/2019 10:42   DG CHEST PORT 1 VIEW  Result Date: 08/28/2019 CLINICAL DATA:  Chest pain EXAM: PORTABLE CHEST 1 VIEW COMPARISON:  August 26, 2019 FINDINGS: There is cardiomegaly. There is mild pulmonary venous hypertension. Pacemaker leads attached to right atrium and coronary sinus,, stable. There are persistent bilateral pleural effusions with airspace opacity in the left lower lobe, stable. No new opacity evident. There is no evident adenopathy. There is aortic atherosclerosis. No bone lesions. IMPRESSION: Cardiomegaly with a degree of pulmonary vascular congestion. Persistent pleural effusions bilaterally with left lower lobe airspace opacity concerning for atelectasis and potential pneumonia in the left lower lung region. No new opacity evident. Pacemaker leads unchanged in position. Status post coronary artery bypass grafting. Aortic Atherosclerosis (ICD10-I70.0). Electronically Signed   By: Lowella Grip III M.D.   On: 08/28/2019 08:11   DG CHEST PORT 1 VIEW  Result Date: 08/26/2019 CLINICAL DATA:  Congestive heart failure EXAM: PORTABLE CHEST 1 VIEW COMPARISON:  August 24, 2019 FINDINGS: There is cardiomegaly with pulmonary vascularity normal. Pacemaker leads attached to right atrium and coronary sinus. Postoperative changes noted. There is aortic atherosclerosis. There are pleural effusions bilaterally. There is airspace opacity throughout the left lower lobe. There is ill-defined airspace opacity in the left mid and lower lung regions as well. No adenopathy appreciable.  No bone lesions. IMPRESSION: Cardiomegaly with postoperative changes. Pleural effusions bilaterally with areas of airspace opacity in the mid and lower lung regions, more severe on the left than on the right, similar to 2 days prior. Aortic Atherosclerosis (ICD10-I70.0). Electronically Signed   By: Lowella Grip III M.D.   On: 08/26/2019  10:31   DG Chest Port 1 View  Result Date: 08/18/2019 CLINICAL DATA:  Fever and weakness EXAM: PORTABLE CHEST 1 VIEW COMPARISON:  July 14, 2019 FINDINGS: There is cardiomegaly with pulmonary vascularity normal. Pacemaker leads are attached to right atrium, right ventricle, and coronary sinus. There is consolidation in the left lower lobe with small left pleural effusion. There is a small right pleural effusion with slight right base atelectasis. Patient is status post internal mammary bypass grafting. There is aortic atherosclerosis. No adenopathy. There is arthropathy in each shoulder. IMPRESSION: Cardiomegaly. Airspace opacity consistent with pneumonia left base. Small pleural effusions bilaterally. Postoperative changes noted. Aortic Atherosclerosis (ICD10-I70.0). Electronically Signed   By: Lowella Grip III M.D.   On: 08/18/2019 14:20   DG Chest Port 1V  same Day  Result Date: 08/28/2019 CLINICAL DATA:  PICC placement. EXAM: PORTABLE CHEST 1 VIEW COMPARISON:  08/28/2019 and CT chest 05/19/2019. FINDINGS: Left PICC tip is in the low SVC. Heart is enlarged, stable. Thoracic aorta is calcified. Permanent pacemaker lead tip and trans venous temporary pacemaker lead tip, from an IVC approach, are unchanged in position. Mid and lower lung zone mixed interstitial and airspace opacification with bilateral pleural effusions, similar. Left lower lobe consolidation persists. IMPRESSION: 1. Left PICC tip is in the SVC. 2. Congestive heart failure with left lower lobe collapse/consolidation. Pneumonia cannot be excluded. 3.  Aortic atherosclerosis (ICD10-I70.0). Electronically Signed   By: Lorin Picket M.D.   On: 08/28/2019 10:49   DG C-Arm 1-60 Min  Result Date: 08/25/2019 CLINICAL DATA:  Pacemaker lead removal EXAM: DG C-ARM 1-60 MIN; CHEST  1 VIEW FLUOROSCOPY TIME:  Fluoroscopy Time: 27 minutes 15 seconds 716.09 mGy Number of Acquired Spot Images: 1 COMPARISON:  Chest radiograph August 24, 2019 FINDINGS:  Frontal spot image over the left lower chest shows stable pacemaker leads attached to the right heart and coronary sinus region. Visualized heart appears enlarged. IMPRESSION: Stable appearing lead placement on frontal view.  Cardiomegaly. Electronically Signed   By: Lowella Grip III M.D.   On: 08/25/2019 19:15   ECHOCARDIOGRAM COMPLETE  Result Date: 08/21/2019    ECHOCARDIOGRAM REPORT   Patient Name:   Thomas [name] Date of Exam: 08/21/2019 Medical Rec #:  [id number]      Height:       74.0 in Accession #:    [id number]     Weight:       203.3 lb Date of Birth:  December 28, 1934     BSA:          2.188 m Patient Age:    85 years       BP:           121/57 mmHg Patient Gender: M              HR:           61 bpm. Exam Location:  [facility] Procedure: 2D Echo, Color Doppler and Cardiac Doppler Indications:     R78.81 Bacteremia  History:         Patient has prior history of Echocardiogram examinations, most                  recent 06/22/2019. ICM and CHF, Pacemaker, CKD; Risk                  Factors:Hypertension.  Sonographer:     Charmayne Sheer RDCS (AE) Referring Phys:  [id number] Sharen Hones Diagnosing Phys: Serafina Royals MD IMPRESSIONS  1. Left ventricular ejection fraction, by estimation, is 55 to 60%. The left ventricle has normal function. The left ventricle has no regional wall motion abnormalities. Left ventricular diastolic parameters were normal.  2. Right ventricular systolic function is normal. The right ventricular size is normal. There is moderately elevated pulmonary artery systolic pressure.  3. Left atrial size was mildly dilated.  4. Right atrial size was mildly dilated.  5. The mitral valve is normal in structure. Moderate mitral valve regurgitation.  6. Tricuspid valve regurgitation is moderate.  7. The aortic valve is normal in structure. Aortic valve regurgitation is trivial. FINDINGS  Left Ventricle: Left ventricular ejection fraction, by estimation, is 55 to 60%. The left ventricle has normal function.  The left ventricle has no regional wall motion abnormalities. The left  ventricular internal cavity size was normal in size. There is  no left ventricular hypertrophy. Left ventricular diastolic parameters were normal. Right Ventricle: The right ventricular size is normal. No increase in right ventricular wall thickness. Right ventricular systolic function is normal. There is moderately elevated pulmonary artery systolic pressure. The tricuspid regurgitant velocity is 3.12 m/s, and with an assumed right atrial pressure of 10 mmHg, the estimated right ventricular systolic pressure is 19.1 mmHg. Left Atrium: Left atrial size was mildly dilated. Right Atrium: Right atrial size was mildly dilated. Pericardium: There is no evidence of pericardial effusion. Mitral Valve: The mitral valve is normal in structure. Moderate mitral valve regurgitation. MV peak gradient, 7.3 mmHg. The mean mitral valve gradient is 2.0 mmHg. Tricuspid Valve: The tricuspid valve is normal in structure. Tricuspid valve regurgitation is moderate. Aortic Valve: The aortic valve is normal in structure. Aortic valve regurgitation is trivial. Aortic valve mean gradient measures 5.0 mmHg. Aortic valve peak gradient measures 11.2 mmHg. Aortic valve area, by VTI measures 2.80 cm. Pulmonic Valve: The pulmonic valve was normal in structure. Pulmonic valve regurgitation is trivial. Aorta: The aortic root and ascending aorta are structurally normal, with no evidence of dilitation. IAS/Shunts: No atrial level shunt detected by color flow Doppler.  LEFT VENTRICLE PLAX 2D LVIDd:         6.16 cm      Diastology LVIDs:         5.53 cm      LV e' lateral:   13.40 cm/s LV PW:         1.11 cm      LV E/e' lateral: 9.1 LV IVS:        0.88 cm      LV e' medial:    7.29 cm/s LVOT diam:     2.30 cm      LV E/e' medial:  16.8 LV SV:         77 LV SV Index:   35 LVOT Area:     4.15 cm  LV Volumes (MOD) LV vol d, MOD A2C: 163.0 ml LV vol d, MOD A4C: 167.0 ml LV vol s, MOD  A2C: 74.6 ml LV vol s, MOD A4C: 77.1 ml LV SV MOD A2C:     88.4 ml LV SV MOD A4C:     167.0 ml LV SV MOD BP:      98.1 ml RIGHT VENTRICLE RV Basal diam:  4.69 cm LEFT ATRIUM              Index       RIGHT ATRIUM           Index LA diam:        6.20 cm  2.83 cm/m  RA Area:     34.40 cm LA Vol (A2C):   109.0 ml 49.81 ml/m RA Volume:   123.00 ml 56.20 ml/m LA Vol (A4C):   130.0 ml 59.40 ml/m LA Biplane Vol: 119.0 ml 54.38 ml/m  AORTIC VALVE                    PULMONIC VALVE AV Area (Vmax):    2.59 cm     PV Vmax:       1.14 m/s AV Area (Vmean):   2.74 cm     PV Vmean:      80.900 cm/s AV Area (VTI):     2.80 cm     PV VTI:        0.252  m AV Vmax:           167.00 cm/s  PV Peak grad:  5.2 mmHg AV Vmean:          103.000 cm/s PV Mean grad:  3.0 mmHg AV VTI:            0.276 m AV Peak Grad:      11.2 mmHg AV Mean Grad:      5.0 mmHg LVOT Vmax:         104.00 cm/s LVOT Vmean:        67.900 cm/s LVOT VTI:          0.186 m LVOT/AV VTI ratio: 0.67  AORTA Ao Root diam: 4.20 cm MITRAL VALVE                TRICUSPID VALVE MV Area (PHT): 3.94 cm     TR Peak grad:   38.9 mmHg MV Peak grad:  7.3 mmHg     TR Vmax:        312.00 cm/s MV Mean grad:  2.0 mmHg MV Vmax:       1.35 m/s     SHUNTS MV Vmean:      61.1 cm/s    Systemic VTI:  0.19 m MV Decel Time: 193 msec     Systemic Diam: 2.30 cm MV E velocity: 122.50 cm/s Serafina Royals MD Electronically signed by Serafina Royals MD Signature Date/Time: 08/21/2019/5:42:42 PM    Final    ECHO TEE  Result Date: 08/23/2019    TRANSESOPHOGEAL ECHO REPORT   Patient Name:   Thomas [name] Date of Exam: 08/23/2019 Medical Rec #:  [id number]      Height:       74.0 in Accession #:    [id number]     Weight:       203.3 lb Date of Birth:  09/01/1934     BSA:          2.188 m Patient Age:    85 years       BP:           108/62 mmHg Patient Gender: M              HR:           58 bpm. Exam Location:  [facility] Procedure: Transesophageal Echo, Cardiac Doppler and Color Doppler Indications:     Not  listed on order  History:         Patient has prior history of Echocardiogram examinations, most                  recent 08/21/2019. CAD, Prior CABG and AICD; Risk                  Factors:Hypertension. PAF.  Sonographer:     Sherrie Sport RDCS (AE) Referring Phys:  [id number] Kate Sable Diagnosing Phys: Kate Sable MD PROCEDURE: The transesophogeal probe was passed without difficulty through the esophogus of the patient. Sedation performed by performing physician. The patient developed no complications during the procedure. IMPRESSIONS  1. Left ventricular ejection fraction, by estimation, is 55 to 60%. The left ventricle has normal function. The left ventricle has no regional wall motion abnormalities.  2. Right ventricular systolic function is normal. The right ventricular size is normal.  3. Left atrial size was mild to moderately dilated. No left atrial/left atrial appendage thrombus was detected.  4. Right atrial size was mildly dilated.  5. The mitral valve is  normal in structure. Mild mitral valve regurgitation.  6. Tricuspid valve regurgitation is moderate to severe.  7. The aortic valve is tricuspid. Aortic valve regurgitation is mild. Conclusion(s)/Recommendation(s): No evidence of vegetation/infective endocarditis on this transesophageal echocardiogram. FINDINGS  Left Ventricle: Left ventricular ejection fraction, by estimation, is 55 to 60%. The left ventricle has normal function. The left ventricle has no regional wall motion abnormalities. The left ventricular internal cavity size was normal in size. There is  no left ventricular hypertrophy. Right Ventricle: The right ventricular size is normal. No increase in right ventricular wall thickness. Right ventricular systolic function is normal. Left Atrium: Left atrial size was mild to moderately dilated. No left atrial/left atrial appendage thrombus was detected. Right Atrium: Right atrial size was mildly dilated. Pericardium: There is no evidence  of pericardial effusion. Mitral Valve: The mitral valve is normal in structure. Mild mitral valve regurgitation. Tricuspid Valve: The tricuspid valve is grossly normal. Tricuspid valve regurgitation is moderate to severe. Aortic Valve: The aortic valve is tricuspid. Aortic valve regurgitation is mild. Pulmonic Valve: The pulmonic valve was not well visualized. Pulmonic valve regurgitation is trivial. Aorta: The aortic root is normal in size and structure. Venous: The inferior vena cava was not well visualized. IAS/Shunts: No atrial level shunt detected by color flow Doppler. Additional Comments: A pacer wire is visualized. Kate Sable MD Electronically signed by Kate Sable MD Signature Date/Time: 08/23/2019/4:10:21 PM    Final    CUP PACEART REMOTE DEVICE CHECK  Result Date: 08/16/2019 Scheduled remote reviewed. Normal device function.  Battery estimated 1 month to ERI, wireless transmitting. Next remote 91 days. [employer]  VAS [place] UPPER EXTREMITY VENOUS DUPLEX  Result Date: 08/26/2019 UPPER VENOUS STUDY  Indications: Swelling Performing Technologist: June Leap RDMS, RVT  Examination Guidelines: A complete evaluation includes B-mode imaging, spectral Doppler, color Doppler, and power Doppler as needed of all accessible portions of each vessel. Bilateral testing is considered an integral part of a complete examination. Limited examinations for reoccurring indications may be performed as noted.  Right Findings: +-----+------------+---------+-----------+----------+--------------+ RIGHTCompressiblePhasicitySpontaneousProperties   Summary     +-----+------------+---------+-----------+----------+--------------+ IJV                                            Not visualized +-----+------------+---------+-----------+----------+--------------+  Left Findings: +----------+------------+---------+-----------+----------+--------------------+ LEFT      CompressiblePhasicitySpontaneousProperties       Summary        +----------+------------+---------+-----------+----------+--------------------+ IJV           Full       Yes       Yes                                   +----------+------------+---------+-----------+----------+--------------------+ Subclavian                                             Not visualized    +----------+------------+---------+-----------+----------+--------------------+ Axillary      Full       Yes       Yes                                   +----------+------------+---------+-----------+----------+--------------------+  Brachial      Full       Yes       Yes                                   +----------+------------+---------+-----------+----------+--------------------+ Radial        Full                                                       +----------+------------+---------+-----------+----------+--------------------+ Ulnar         Full                                                       +----------+------------+---------+-----------+----------+--------------------+ Cephalic      None                                     Acute thrombus                                                              forearm        +----------+------------+---------+-----------+----------+--------------------+ Basilic       Full                                                       +----------+------------+---------+-----------+----------+--------------------+  Summary:  Left: No evidence of deep vein thrombosis in the upper extremity. Findings consistent with acute superficial vein thrombosis involving the left cephalic vein.  *See table(s) above for measurements and observations.  Diagnosing physician: Monica Martinez MD Electronically signed by Monica Martinez MD on 08/26/2019 at 12:49:19 PM.    Final    [place] EKG SITE RITE  Result Date: 08/27/2019 If Site Rite image not attached, placement could not be confirmed due to current  cardiac rhythm.  [place] EKG SITE RITE  Result Date: 08/25/2019 If Site Rite image not attached, placement could not be confirmed due to current cardiac rhythm.      The results of significant diagnostics from this hospitalization (including imaging, microbiology, ancillary and laboratory) are listed below for reference.     Microbiology: Recent Results (from the past 240 hour(s))  SARS CORONAVIRUS 2 (TAT 6-24 HRS) Nasopharyngeal Nasopharyngeal Swab     Status: None   Collection Time: 09/08/19  5:40 PM   Specimen: Nasopharyngeal Swab  Result Value Ref Range Status   SARS Coronavirus 2 NEGATIVE NEGATIVE Final    Comment: (NOTE) SARS-CoV-2 target nucleic acids are NOT DETECTED.  The SARS-CoV-2 RNA is generally detectable in upper and lower respiratory specimens during the acute phase of infection. Negative results do not preclude SARS-CoV-2 infection, do not rule out co-infections with other pathogens, and should not be  used as the sole basis for treatment or other patient management decisions. Negative results must be combined with clinical observations, patient history, and epidemiological information. The expected result is Negative.  Fact Sheet for Patients: SugarRoll.be  Fact Sheet for Healthcare Providers: https://www.woods-mathews.com/  This test is not yet approved or cleared by the [place] FDA and  has been authorized for detection and/or diagnosis of SARS-CoV-2 by FDA under an Emergency Use Authorization (EUA). This EUA will remain  in effect (meaning this test can be used) for the duration of the COVID-19 declaration under Se ction 564(b)(1) of the Act, 21 U.S.C. section 360bbb-3(b)(1), unless the authorization is terminated or revoked sooner.  Performed at [facility], [place] [address] [place], [place] [address]   SARS CORONAVIRUS 2 (TAT 6-24 HRS) Nasopharyngeal Nasopharyngeal Swab     Status: None   Collection  Time: 09/11/19 11:50 PM   Specimen: Nasopharyngeal Swab  Result Value Ref Range Status   SARS Coronavirus 2 NEGATIVE NEGATIVE Final    Comment: (NOTE) SARS-CoV-2 target nucleic acids are NOT DETECTED.  The SARS-CoV-2 RNA is generally detectable in upper and lower respiratory specimens during the acute phase of infection. Negative results do not preclude SARS-CoV-2 infection, do not rule out co-infections with other pathogens, and should not be used as the sole basis for treatment or other patient management decisions. Negative results must be combined with clinical observations, patient history, and epidemiological information. The expected result is Negative.  Fact Sheet for Patients: SugarRoll.be  Fact Sheet for Healthcare Providers: https://www.woods-mathews.com/  This test is not yet approved or cleared by the [place] FDA and  has been authorized for detection and/or diagnosis of SARS-CoV-2 by FDA under an Emergency Use Authorization (EUA). This EUA will remain  in effect (meaning this test can be used) for the duration of the COVID-19 declaration under Se ction 564(b)(1) of the Act, 21 U.S.C. section 360bbb-3(b)(1), unless the authorization is terminated or revoked sooner.  Performed at [facility], [place] [address], [place], [place] [address]      Labs: BNP (last 3 results) Recent Labs    08/18/19 1353 08/24/19 2100 09/05/19 0500  BNP 588.8* 480.2* 924.4*   Basic Metabolic Panel: Recent Labs  Lab 09/07/19 0134 09/07/19 0134 09/08/19 0500 09/09/19 0400 09/10/19 0410 09/11/19 0312 09/12/19 0441  NA 139   < > 137 139 140 141 143  K 3.7   < > 3.6 3.3* 3.6 3.7 4.0  CL 102   < > 99 97* 99 101 103  CO2 31   < > 31 32 33* 32 33*  GLUCOSE 93   < > 94 92 104* 99 95  BUN 13   < > '13 13 13 15 16  ' CREATININE 0.89   < > 1.02 0.92 0.98 0.96 1.00  CALCIUM 7.9*   < > 7.7* 7.6* 8.0* 8.2* 8.3*  MG 1.8   < > 1.8 1.7  2.0 1.8 2.1  PHOS 3.1  --   --  3.2 2.7 2.6 3.0   < > = values in this interval not displayed.   Liver Function Tests: Recent Labs  Lab 09/07/19 0134 09/09/19 0400 09/10/19 0410 09/11/19 0312 09/12/19 0441  ALBUMIN 1.4* 1.4* 2.0* 2.0* 1.9*   No results for input(s): LIPASE, AMYLASE in the last 168 hours. No results for input(s): AMMONIA in the last 168 hours. CBC: Recent Labs  Lab 09/06/19 0441 09/06/19 0441 09/07/19 0134 09/08/19 0500 09/09/19 0400 09/10/19 0410  09/10/19 1605 09/11/19 0313 09/12/19 0442  WBC 4.9  --  4.8  --   --  4.3  --  5.1 5.4  HGB 7.6*   < > 7.9*   < > 7.8* 7.5* 8.4* 8.7* 8.8*  HCT 25.4*   < > 26.3*   < > 26.1* 25.0* 27.7* 28.4* 28.6*  MCV 87.0  --  85.9  --   --  87.7  --  86.1 86.4  PLT 176  --  186  --   --  166  --  196 218   < > = values in this interval not displayed.   Cardiac Enzymes: No results for input(s): CKTOTAL, CKMB, CKMBINDEX, TROPONINI in the last 168 hours. BNP: Invalid input(s): POCBNP CBG: Recent Labs  Lab 09/08/19 0606 09/09/19 0457 09/09/19 0745 09/10/19 0426 09/11/19 0600  GLUCAP 86 94 101* 125* 95   D-Dimer No results for input(s): DDIMER in the last 72 hours. Hgb A1c No results for input(s): HGBA1C in the last 72 hours. Lipid Profile No results for input(s): CHOL, HDL, LDLCALC, TRIG, CHOLHDL, LDLDIRECT in the last 72 hours. Thyroid function studies No results for input(s): TSH, T4TOTAL, T3FREE, THYROIDAB in the last 72 hours.  Invalid input(s): FREET3 Anemia work up No results for input(s): VITAMINB12, FOLATE, FERRITIN, TIBC, IRON, RETICCTPCT in the last 72 hours. Urinalysis    Component Value Date/Time   COLORURINE YELLOW (A) 08/20/2019 0924   APPEARANCEUR HAZY (A) 08/20/2019 0924   APPEARANCEUR Clear 12/13/2017 0918   LABSPEC 1.015 08/20/2019 0924   LABSPEC 1.021 11/19/2011 1501   PHURINE 5.0 08/20/2019 0924   GLUCOSEU NEGATIVE 08/20/2019 0924   GLUCOSEU Negative 11/19/2011 1501   HGBUR NEGATIVE  08/20/2019 0924   BILIRUBINUR NEGATIVE 08/20/2019 0924   BILIRUBINUR Negative 12/13/2017 0918   BILIRUBINUR Negative 11/19/2011 1501   KETONESUR NEGATIVE 08/20/2019 0924   PROTEINUR NEGATIVE 08/20/2019 0924   NITRITE NEGATIVE 08/20/2019 0924   LEUKOCYTESUR NEGATIVE 08/20/2019 0924   LEUKOCYTESUR Negative 11/19/2011 1501   Sepsis Labs Invalid input(s): PROCALCITONIN,  WBC,  LACTICIDVEN   Time coordinating discharge: 45 minutes  SIGNED:  Mercy Riding, MD  [facility] 09/12/2019, 5:49 PM  If 7PM-7AM, please contact night-coverage [email or web address] Password [email or web address]

## 2019-09-12 NOTE — Plan of Care (Signed)
  Problem: Education: Goal: Knowledge of General Education information will improve Description: Including pain rating scale, medication(s)/side effects and non-pharmacologic comfort measures Outcome: Progressing   Problem: Health Behavior/Discharge Planning: Goal: Ability to manage health-related needs will improve Outcome: Progressing   Problem: Clinical Measurements: Goal: Will remain free from infection Outcome: Progressing   Problem: Nutrition: Goal: Adequate nutrition will be maintained Outcome: Progressing   Problem: Coping: Goal: Level of anxiety will decrease Outcome: Progressing   Problem: Skin Integrity: Goal: Risk for impaired skin integrity will decrease Outcome: Progressing

## 2019-09-12 NOTE — Social Work (Signed)
SNF insurance Josem Kaufmann is [phone], this Josem Kaufmann is good for 5 days.   PTAR Josem Kaufmann was denied. Pt sis not meet criteria according to [employer].   Emeterio Reeve, Latanya Presser, [place] Social Worker [phone]

## 2019-09-12 NOTE — TOC Transition Note (Signed)
Transition of Care [name]) - CM/SW Discharge Note   Patient Details  Name: [name] MRN: [id number] Date of Birth: [birth year]  Transition of Care [facility]) CM/SW Contact:  Vinie Sill, [place] Phone Number: 09/12/2019, 4:41 PM   Clinical Narrative:     Patient will DC to: Thomas [name]  DC Date: 09/12/2019 Family Notified:Thomas [name],spouse Transport By: Corey Harold   Per MD patient is ready for discharge. RN, patient, and facility notified of DC. Discharge Summary sent to facility. RN given number for [phone]. Ambulance transport requested for patient.   Clinical Social Worker signing off.  Thurmond Butts, MSW, [place] Clinical Social Worker     Final next level of care: Skilled Nursing Facility Barriers to Discharge: Barriers Resolved   Patient Goals and CMS Choice Patient states their goals for this hospitalization and ongoing recovery are:: to go to rehab for a short time CMS Medicare.gov Compare Post Acute Care list provided to:: Patient Choice offered to / list presented to : Patient  Discharge Placement              Patient chooses bed at: [facility] Patient to be transferred to facility by: [place] Name of family member notified: Thomas [name],spouse Patient and family notified of of transfer: 09/12/19  Discharge Plan and Services                          HH Arranged: RN, PT, OT [facility] Agency: [facility] Date [place]: 09/09/19 Time [place]: [place] Representative spoke with at [place]: [place] ([place]) Interventions     Readmission Risk Interventions Readmission Risk Prevention Plan 08/21/2019 06/23/2019 06/20/2019  Transportation Screening Complete Complete Complete  PCP or Specialist Appt within 3-5 Days - Complete -  [place] or [place] - Complete Complete  Social Work Consult for [place] Planning/Counseling - Complete -  [employer] - Not Applicable Not Applicable   Medication Review Press photographer) Complete Complete Complete  PCP or Specialist appointment within 3-5 days of discharge Complete - -  [place] or Home Care Consult Complete - -  [place] Complete - -  Some recent data might be hidden

## 2019-09-13 LAB — TYPE AND SCREEN
ABO/RH(D): O POS
Antibody Screen: NEGATIVE
Unit division: 0
Unit division: 0

## 2019-09-13 LAB — BPAM RBC
Blood Product Expiration Date: 202107132359
Blood Product Expiration Date: 202107172359
ISSUE DATE / TIME: 202106271018
Unit Type and Rh: 5100
Unit Type and Rh: 5100

## 2019-09-14 ENCOUNTER — Ambulatory Visit: Payer: PPO

## 2019-09-21 ENCOUNTER — Encounter: Payer: PPO | Admitting: Internal Medicine

## 2019-09-26 ENCOUNTER — Telehealth (HOSPITAL_COMMUNITY): Payer: Self-pay

## 2019-09-26 NOTE — Telephone Encounter (Signed)
I left a message on machine for Thomas [name] to call Dental Medicine to schedule a Follow up Suture Removal appointment for patient.

## 2019-09-28 ENCOUNTER — Telehealth: Payer: Self-pay

## 2019-09-28 ENCOUNTER — Inpatient Hospital Stay: Payer: PPO | Admitting: Infectious Diseases

## 2019-09-28 NOTE — Telephone Encounter (Signed)
[facility] called this morning to verify time of appt and Abbie gave appt info. We also had sign language interp. Scheduled to come.... I called back  And attempted to get notes and lab results and asked if they were on the way at 12:20 and was told transportation canceled his appt today because they did not have his labs done. I asked to speak to clinical staff and was hung up on and called back twice with no answer.

## 2019-10-03 DIAGNOSIS — Z950 Presence of cardiac pacemaker: Secondary | ICD-10-CM | POA: Diagnosis not present

## 2019-10-03 DIAGNOSIS — I2510 Atherosclerotic heart disease of native coronary artery without angina pectoris: Secondary | ICD-10-CM | POA: Diagnosis not present

## 2019-10-03 DIAGNOSIS — Z951 Presence of aortocoronary bypass graft: Secondary | ICD-10-CM | POA: Diagnosis not present

## 2019-10-03 DIAGNOSIS — N183 Chronic kidney disease, stage 3 unspecified: Secondary | ICD-10-CM | POA: Diagnosis not present

## 2019-10-03 DIAGNOSIS — I959 Hypotension, unspecified: Secondary | ICD-10-CM | POA: Diagnosis not present

## 2019-10-03 DIAGNOSIS — I5042 Chronic combined systolic (congestive) and diastolic (congestive) heart failure: Secondary | ICD-10-CM | POA: Diagnosis not present

## 2019-10-03 DIAGNOSIS — E8809 Other disorders of plasma-protein metabolism, not elsewhere classified: Secondary | ICD-10-CM | POA: Diagnosis not present

## 2019-10-03 DIAGNOSIS — I480 Paroxysmal atrial fibrillation: Secondary | ICD-10-CM | POA: Diagnosis not present

## 2019-10-03 DIAGNOSIS — Z8546 Personal history of malignant neoplasm of prostate: Secondary | ICD-10-CM | POA: Diagnosis not present

## 2019-10-03 DIAGNOSIS — I130 Hypertensive heart and chronic kidney disease with heart failure and stage 1 through stage 4 chronic kidney disease, or unspecified chronic kidney disease: Secondary | ICD-10-CM | POA: Diagnosis not present

## 2019-10-03 DIAGNOSIS — Z9181 History of falling: Secondary | ICD-10-CM | POA: Diagnosis not present

## 2019-10-03 DIAGNOSIS — Z8673 Personal history of transient ischemic attack (TIA), and cerebral infarction without residual deficits: Secondary | ICD-10-CM | POA: Diagnosis not present

## 2019-10-03 DIAGNOSIS — M6259 Muscle wasting and atrophy, not elsewhere classified, multiple sites: Secondary | ICD-10-CM | POA: Diagnosis not present

## 2019-10-03 DIAGNOSIS — I890 Lymphedema, not elsewhere classified: Secondary | ICD-10-CM | POA: Diagnosis not present

## 2019-10-03 DIAGNOSIS — I252 Old myocardial infarction: Secondary | ICD-10-CM | POA: Diagnosis not present

## 2019-10-03 DIAGNOSIS — H918X3 Other specified hearing loss, bilateral: Secondary | ICD-10-CM | POA: Diagnosis not present

## 2019-10-03 DIAGNOSIS — I714 Abdominal aortic aneurysm, without rupture: Secondary | ICD-10-CM | POA: Diagnosis not present

## 2019-10-03 DIAGNOSIS — E785 Hyperlipidemia, unspecified: Secondary | ICD-10-CM | POA: Diagnosis not present

## 2019-10-03 DIAGNOSIS — I255 Ischemic cardiomyopathy: Secondary | ICD-10-CM | POA: Diagnosis not present

## 2019-10-03 DIAGNOSIS — Z87891 Personal history of nicotine dependence: Secondary | ICD-10-CM | POA: Diagnosis not present

## 2019-10-03 DIAGNOSIS — L89151 Pressure ulcer of sacral region, stage 1: Secondary | ICD-10-CM | POA: Diagnosis not present

## 2019-10-03 DIAGNOSIS — D631 Anemia in chronic kidney disease: Secondary | ICD-10-CM | POA: Diagnosis not present

## 2019-10-03 DIAGNOSIS — L539 Erythematous condition, unspecified: Secondary | ICD-10-CM | POA: Diagnosis not present

## 2019-10-04 ENCOUNTER — Telehealth: Payer: Self-pay | Admitting: Internal Medicine

## 2019-10-04 NOTE — Telephone Encounter (Signed)
Wife of the patient called using an interpreter. Wife wanted to know if the patient is allowed to drink Coca Cola or Mountain Dew (soda), and if he can, how much is safe for him to drink. She was concerned because the patient has a pacemaker

## 2019-10-04 NOTE — Telephone Encounter (Signed)
Returned call to patient's wife left Dr.Croitoru's advice on personal voice mail.

## 2019-10-04 NOTE — Telephone Encounter (Signed)
Please limit to one caffeinated beverage a day

## 2019-10-04 NOTE — Therapy (Signed)
[place] MAIN [facility] SERVICES [address] [place], [place], [address] Phone: [phone]   Fax:  [phone]  Patient Details  Name: Thomas [name] MRN: [id number] Date of Birth: Oct 30, 1934 Referring Provider:  Alisa Graff, FNP  Encounter Date: 08/15/2019 Note opened in error.   [name] 10/04/2019, 1:29 PM  [place] MAIN [facility] SERVICES [address] [place], [place], [address] Phone: [phone]   Fax:  [phone]

## 2019-10-04 NOTE — Telephone Encounter (Signed)
Message sent to Dr.Croitoru for advice.

## 2019-10-08 ENCOUNTER — Other Ambulatory Visit: Payer: Self-pay | Admitting: Family

## 2019-10-10 DIAGNOSIS — Z8673 Personal history of transient ischemic attack (TIA), and cerebral infarction without residual deficits: Secondary | ICD-10-CM | POA: Diagnosis not present

## 2019-10-10 DIAGNOSIS — N183 Chronic kidney disease, stage 3 unspecified: Secondary | ICD-10-CM | POA: Diagnosis not present

## 2019-10-10 DIAGNOSIS — I255 Ischemic cardiomyopathy: Secondary | ICD-10-CM | POA: Diagnosis not present

## 2019-10-10 DIAGNOSIS — L89151 Pressure ulcer of sacral region, stage 1: Secondary | ICD-10-CM | POA: Diagnosis not present

## 2019-10-10 DIAGNOSIS — I959 Hypotension, unspecified: Secondary | ICD-10-CM | POA: Diagnosis not present

## 2019-10-10 DIAGNOSIS — D631 Anemia in chronic kidney disease: Secondary | ICD-10-CM | POA: Diagnosis not present

## 2019-10-10 DIAGNOSIS — H918X3 Other specified hearing loss, bilateral: Secondary | ICD-10-CM | POA: Diagnosis not present

## 2019-10-10 DIAGNOSIS — Z87891 Personal history of nicotine dependence: Secondary | ICD-10-CM | POA: Diagnosis not present

## 2019-10-10 DIAGNOSIS — I252 Old myocardial infarction: Secondary | ICD-10-CM | POA: Diagnosis not present

## 2019-10-10 DIAGNOSIS — I480 Paroxysmal atrial fibrillation: Secondary | ICD-10-CM | POA: Diagnosis not present

## 2019-10-10 DIAGNOSIS — E8809 Other disorders of plasma-protein metabolism, not elsewhere classified: Secondary | ICD-10-CM | POA: Diagnosis not present

## 2019-10-10 DIAGNOSIS — I2510 Atherosclerotic heart disease of native coronary artery without angina pectoris: Secondary | ICD-10-CM | POA: Diagnosis not present

## 2019-10-10 DIAGNOSIS — I714 Abdominal aortic aneurysm, without rupture: Secondary | ICD-10-CM | POA: Diagnosis not present

## 2019-10-10 DIAGNOSIS — Z9181 History of falling: Secondary | ICD-10-CM | POA: Diagnosis not present

## 2019-10-10 DIAGNOSIS — I5042 Chronic combined systolic (congestive) and diastolic (congestive) heart failure: Secondary | ICD-10-CM | POA: Diagnosis not present

## 2019-10-10 DIAGNOSIS — I890 Lymphedema, not elsewhere classified: Secondary | ICD-10-CM | POA: Diagnosis not present

## 2019-10-10 DIAGNOSIS — Z8546 Personal history of malignant neoplasm of prostate: Secondary | ICD-10-CM | POA: Diagnosis not present

## 2019-10-10 DIAGNOSIS — Z950 Presence of cardiac pacemaker: Secondary | ICD-10-CM | POA: Diagnosis not present

## 2019-10-10 DIAGNOSIS — L539 Erythematous condition, unspecified: Secondary | ICD-10-CM | POA: Diagnosis not present

## 2019-10-10 DIAGNOSIS — Z951 Presence of aortocoronary bypass graft: Secondary | ICD-10-CM | POA: Diagnosis not present

## 2019-10-10 DIAGNOSIS — M6259 Muscle wasting and atrophy, not elsewhere classified, multiple sites: Secondary | ICD-10-CM | POA: Diagnosis not present

## 2019-10-10 DIAGNOSIS — I130 Hypertensive heart and chronic kidney disease with heart failure and stage 1 through stage 4 chronic kidney disease, or unspecified chronic kidney disease: Secondary | ICD-10-CM | POA: Diagnosis not present

## 2019-10-10 DIAGNOSIS — E785 Hyperlipidemia, unspecified: Secondary | ICD-10-CM | POA: Diagnosis not present

## 2019-10-11 DIAGNOSIS — E785 Hyperlipidemia, unspecified: Secondary | ICD-10-CM | POA: Diagnosis not present

## 2019-10-11 DIAGNOSIS — N183 Chronic kidney disease, stage 3 unspecified: Secondary | ICD-10-CM | POA: Diagnosis not present

## 2019-10-11 DIAGNOSIS — Z951 Presence of aortocoronary bypass graft: Secondary | ICD-10-CM | POA: Diagnosis not present

## 2019-10-11 DIAGNOSIS — I252 Old myocardial infarction: Secondary | ICD-10-CM | POA: Diagnosis not present

## 2019-10-11 DIAGNOSIS — Z9181 History of falling: Secondary | ICD-10-CM | POA: Diagnosis not present

## 2019-10-11 DIAGNOSIS — Z950 Presence of cardiac pacemaker: Secondary | ICD-10-CM | POA: Diagnosis not present

## 2019-10-11 DIAGNOSIS — E8809 Other disorders of plasma-protein metabolism, not elsewhere classified: Secondary | ICD-10-CM | POA: Diagnosis not present

## 2019-10-11 DIAGNOSIS — Z8546 Personal history of malignant neoplasm of prostate: Secondary | ICD-10-CM | POA: Diagnosis not present

## 2019-10-11 DIAGNOSIS — I480 Paroxysmal atrial fibrillation: Secondary | ICD-10-CM | POA: Diagnosis not present

## 2019-10-11 DIAGNOSIS — L89151 Pressure ulcer of sacral region, stage 1: Secondary | ICD-10-CM | POA: Diagnosis not present

## 2019-10-11 DIAGNOSIS — H918X3 Other specified hearing loss, bilateral: Secondary | ICD-10-CM | POA: Diagnosis not present

## 2019-10-11 DIAGNOSIS — I2510 Atherosclerotic heart disease of native coronary artery without angina pectoris: Secondary | ICD-10-CM | POA: Diagnosis not present

## 2019-10-11 DIAGNOSIS — I890 Lymphedema, not elsewhere classified: Secondary | ICD-10-CM | POA: Diagnosis not present

## 2019-10-11 DIAGNOSIS — I130 Hypertensive heart and chronic kidney disease with heart failure and stage 1 through stage 4 chronic kidney disease, or unspecified chronic kidney disease: Secondary | ICD-10-CM | POA: Diagnosis not present

## 2019-10-11 DIAGNOSIS — I714 Abdominal aortic aneurysm, without rupture: Secondary | ICD-10-CM | POA: Diagnosis not present

## 2019-10-11 DIAGNOSIS — D631 Anemia in chronic kidney disease: Secondary | ICD-10-CM | POA: Diagnosis not present

## 2019-10-11 DIAGNOSIS — I255 Ischemic cardiomyopathy: Secondary | ICD-10-CM | POA: Diagnosis not present

## 2019-10-11 DIAGNOSIS — I959 Hypotension, unspecified: Secondary | ICD-10-CM | POA: Diagnosis not present

## 2019-10-11 DIAGNOSIS — Z8673 Personal history of transient ischemic attack (TIA), and cerebral infarction without residual deficits: Secondary | ICD-10-CM | POA: Diagnosis not present

## 2019-10-11 DIAGNOSIS — L539 Erythematous condition, unspecified: Secondary | ICD-10-CM | POA: Diagnosis not present

## 2019-10-11 DIAGNOSIS — I5042 Chronic combined systolic (congestive) and diastolic (congestive) heart failure: Secondary | ICD-10-CM | POA: Diagnosis not present

## 2019-10-11 DIAGNOSIS — Z87891 Personal history of nicotine dependence: Secondary | ICD-10-CM | POA: Diagnosis not present

## 2019-10-11 DIAGNOSIS — M6259 Muscle wasting and atrophy, not elsewhere classified, multiple sites: Secondary | ICD-10-CM | POA: Diagnosis not present

## 2019-10-17 DIAGNOSIS — Z8673 Personal history of transient ischemic attack (TIA), and cerebral infarction without residual deficits: Secondary | ICD-10-CM | POA: Diagnosis not present

## 2019-10-17 DIAGNOSIS — Z8546 Personal history of malignant neoplasm of prostate: Secondary | ICD-10-CM | POA: Diagnosis not present

## 2019-10-17 DIAGNOSIS — I252 Old myocardial infarction: Secondary | ICD-10-CM | POA: Diagnosis not present

## 2019-10-17 DIAGNOSIS — D631 Anemia in chronic kidney disease: Secondary | ICD-10-CM | POA: Diagnosis not present

## 2019-10-17 DIAGNOSIS — I2510 Atherosclerotic heart disease of native coronary artery without angina pectoris: Secondary | ICD-10-CM | POA: Diagnosis not present

## 2019-10-17 DIAGNOSIS — Z9181 History of falling: Secondary | ICD-10-CM | POA: Diagnosis not present

## 2019-10-17 DIAGNOSIS — I714 Abdominal aortic aneurysm, without rupture: Secondary | ICD-10-CM | POA: Diagnosis not present

## 2019-10-17 DIAGNOSIS — N183 Chronic kidney disease, stage 3 unspecified: Secondary | ICD-10-CM | POA: Diagnosis not present

## 2019-10-17 DIAGNOSIS — I890 Lymphedema, not elsewhere classified: Secondary | ICD-10-CM | POA: Diagnosis not present

## 2019-10-17 DIAGNOSIS — H918X3 Other specified hearing loss, bilateral: Secondary | ICD-10-CM | POA: Diagnosis not present

## 2019-10-17 DIAGNOSIS — Z87891 Personal history of nicotine dependence: Secondary | ICD-10-CM | POA: Diagnosis not present

## 2019-10-17 DIAGNOSIS — E8809 Other disorders of plasma-protein metabolism, not elsewhere classified: Secondary | ICD-10-CM | POA: Diagnosis not present

## 2019-10-17 DIAGNOSIS — I5042 Chronic combined systolic (congestive) and diastolic (congestive) heart failure: Secondary | ICD-10-CM | POA: Diagnosis not present

## 2019-10-17 DIAGNOSIS — I130 Hypertensive heart and chronic kidney disease with heart failure and stage 1 through stage 4 chronic kidney disease, or unspecified chronic kidney disease: Secondary | ICD-10-CM | POA: Diagnosis not present

## 2019-10-17 DIAGNOSIS — M6259 Muscle wasting and atrophy, not elsewhere classified, multiple sites: Secondary | ICD-10-CM | POA: Diagnosis not present

## 2019-10-17 DIAGNOSIS — Z950 Presence of cardiac pacemaker: Secondary | ICD-10-CM | POA: Diagnosis not present

## 2019-10-17 DIAGNOSIS — Z951 Presence of aortocoronary bypass graft: Secondary | ICD-10-CM | POA: Diagnosis not present

## 2019-10-17 DIAGNOSIS — I959 Hypotension, unspecified: Secondary | ICD-10-CM | POA: Diagnosis not present

## 2019-10-17 DIAGNOSIS — L539 Erythematous condition, unspecified: Secondary | ICD-10-CM | POA: Diagnosis not present

## 2019-10-17 DIAGNOSIS — L89151 Pressure ulcer of sacral region, stage 1: Secondary | ICD-10-CM | POA: Diagnosis not present

## 2019-10-17 DIAGNOSIS — I255 Ischemic cardiomyopathy: Secondary | ICD-10-CM | POA: Diagnosis not present

## 2019-10-17 DIAGNOSIS — E785 Hyperlipidemia, unspecified: Secondary | ICD-10-CM | POA: Diagnosis not present

## 2019-10-17 DIAGNOSIS — I480 Paroxysmal atrial fibrillation: Secondary | ICD-10-CM | POA: Diagnosis not present

## 2019-10-23 ENCOUNTER — Encounter: Payer: Self-pay | Admitting: Cardiovascular Disease

## 2019-10-23 ENCOUNTER — Other Ambulatory Visit: Payer: Self-pay

## 2019-10-23 ENCOUNTER — Ambulatory Visit (INDEPENDENT_AMBULATORY_CARE_PROVIDER_SITE_OTHER): Payer: PPO | Admitting: Cardiovascular Disease

## 2019-10-23 VITALS — BP 110/65 | HR 75 | Ht 74.0 in | Wt 188.4 lb

## 2019-10-23 DIAGNOSIS — I25810 Atherosclerosis of coronary artery bypass graft(s) without angina pectoris: Secondary | ICD-10-CM | POA: Diagnosis not present

## 2019-10-23 DIAGNOSIS — I442 Atrioventricular block, complete: Secondary | ICD-10-CM

## 2019-10-23 DIAGNOSIS — D649 Anemia, unspecified: Secondary | ICD-10-CM

## 2019-10-23 DIAGNOSIS — I50812 Chronic right heart failure: Secondary | ICD-10-CM

## 2019-10-23 DIAGNOSIS — Z95810 Presence of automatic (implantable) cardiac defibrillator: Secondary | ICD-10-CM

## 2019-10-23 DIAGNOSIS — I9589 Other hypotension: Secondary | ICD-10-CM | POA: Diagnosis not present

## 2019-10-23 DIAGNOSIS — E7800 Pure hypercholesterolemia, unspecified: Secondary | ICD-10-CM

## 2019-10-23 DIAGNOSIS — I361 Nonrheumatic tricuspid (valve) insufficiency: Secondary | ICD-10-CM

## 2019-10-23 DIAGNOSIS — I4821 Permanent atrial fibrillation: Secondary | ICD-10-CM

## 2019-10-23 DIAGNOSIS — H9193 Unspecified hearing loss, bilateral: Secondary | ICD-10-CM | POA: Diagnosis not present

## 2019-10-23 NOTE — Progress Notes (Signed)
Cardiology Office Note    Date:  10/26/2019   ID:  Thomas [name], DOB Feb 12, 1935, MRN [id number]  PCP:  [name], MD  Cardiologist:  Cristopher Peru, MD (EP); Sanda Klein, MD   Chief Complaint  Patient presents with  . Follow-up CHF    pt reports no complaints    History of Present Illness:  Thomas [name] is a 85 y.o. male with chronic systolic heart failure related to ischemic cardiomyopathy, previous coronary bypass surgery and previous stent in SVG-OM for acute non-STEMI in 2015, long-term persistent atrial fibrillation, history of second degree Mobitz type II AV block, CRT-D Medtronic device implanted 2014, entire system explanted due to infection, followed by reimplantation by Dr. 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  As always, we need the services of a sign language interpreter.  He is also accompanied by his wife.  He has lost a lot of true weight during his problems with device infection, but is gaining it back.  It is hard to assign a "dry weight" at this point.  He does have edema, but denies shortness of breath.  He does not have orthopnea or PND.  He denies cough, fever, chills, palpitations or syncope or dizziness.  His old and new device sites are well-healed without evidence of swelling or redness.  With the exception of diuretics, all his heart failure medications had to be stopped due to hypotension.  He is actually receiving midodrine 3 times daily.  Still on torsemide and spironolactone in relatively low doses.  ECG today shows atrial fibrillation with biventricular pacing and a distinct positive initial R wave in lead V1 consistent with effective LV pacing.  QTc 509 ms.  Device interrogation in the clinic today shows normal function.  Estimated generator longevity is 7.8 years.  He has had 97.4% biventricular pacing.  He does not have ventricular tachycardia.  He does have a slow ventricular  escape rhythm at 30 to 40 bpm.  He had bypass surgery in 1995. Left ventricle systolic function deteriorated in 2014 without options for revascularization by cardiac catheterization and he underwent implantation of his CRT-D in October 2014. He had substantial clinical improvement and left ventricular EF also improved to 45-50%. In July 2015 he had an acute non-STEMI with placement of a stent to the SVG to OM (bare-metal 3.512 mm MultiLink vision). Native coronary arteries were occluded, LIMA to LAD was patent with a 50-70% stenosis at the anastomosis and chronic total occlusion of SVG-RCA. In 2021 he had to undergo extraction of his CRT-the system due to infection, with reimplantation and June 2021 (Dr. [name]).  Past Medical History:  Diagnosis Date  . AICD (automatic cardioverter/defibrillator) present   . Bacteremia due to group B Streptococcus    Recurrent admissions for group B Strepotococcus bacteremia of unknown source with TEEs negative for vegetation 06/2019 and 08/2019  . Biventricular ICD (implantable cardioverter-defibrillator) in place 03/24/2005   Implantation of a Medtronic Adapta ADDRO1, serial number [id number]  . CHF (congestive heart failure) ([place])   . CKD (chronic kidney disease), stage III   . Coronary artery disease    a. s/p CABG 1986. b. Multiple PCIs/caths. c. 09/2013: s/p PTCA and BMS to SVG-OM.  [name]    Requires sign language interpreter  . Dysrhythmia   . History of abdominal aortic aneurysm   . History of bleeding peptic ulcer 1980  . History of epididymitis 2013  . HTN (  hypertension)   . Hydronephrosis with ureteropelvic junction obstruction   . Hydroureter on left 2009  . Hypertension   . Ischemic cardiomyopathy    a. Prior EF 30-35%, s/p BIV-ICD. b. 09/2013: EF 45-50%.  . Moderate tricuspid regurgitation   . PAF (paroxysmal atrial fibrillation) (HCC)    Not on [place] 2/2 GIB  . Presence of permanent cardiac pacemaker 2002   Original placed in 2002 for CHB  then 2007 and 2014 device change out  . Prostate cancer ([name])   . Status post coronary artery bypass grafting 1986   LIMA to the LAD, SVG to OM, SVG to RCA  . Testicular swelling     Past Surgical History:  Procedure Laterality Date  . 2-D echocardiogram  11/20/2011   Ejection fraction 30-35% moderate concentric left ventricular hypertrophy. Left atrium is moderately dilated. Mild MR. Mild or  . BI-VENTRICULAR IMPLANTABLE CARDIOVERTER DEFIBRILLATOR N/A 12/16/2012   Procedure: BI-VENTRICULAR IMPLANTABLE CARDIOVERTER DEFIBRILLATOR  (CRT-D);  Surgeon: [name], MD;  Location: [facility] CATH LAB;  Service: Cardiovascular;  Laterality: N/A;  . BIV ICD INSERTION CRT-D N/A 09/01/2019   Procedure: BIV ICD INSERTION CRT-D;  Surgeon: [name], MD;  Location: [place] CV LAB;  Service: Cardiovascular;  Laterality: N/A;  . CARDIAC CATHETERIZATION  12/10/2011   SVG to OM widely patent.  LIMA to LAD patent  . CATARACT EXTRACTION W/PHACO Right 10/12/2017   Procedure: CATARACT EXTRACTION PHACO AND INTRAOCULAR LENS PLACEMENT (IOC);  Surgeon: Birder Robson, MD;  Location: [facility] ORS;  Service: Ophthalmology;  Laterality: Right;  [place] 00:57 AP% 15.9 CDE 9.07 Fluid pack lot # [id number]  . COLONOSCOPY N/A 07/13/2018   Procedure: COLONOSCOPY;  Surgeon: Toledo, Benay Pike, MD;  Location: [facility] ENDOSCOPY;  Service: Gastroenterology;  Laterality: N/A;  . CORONARY ARTERY BYPASS GRAFT  1986  . ENTEROSCOPY N/A 09/14/2018   Procedure: ENTEROSCOPY;  Surgeon: Toledo, Benay Pike, MD;  Location: [facility] ENDOSCOPY;  Service: Gastroenterology;  Laterality: N/A;  symptomatic anemia, GI blood loss anemia, melena, positive small bowel capsule endoscopy showing source of bleeding   . ENTEROSCOPY N/A 08/22/2019   Procedure: ENTEROSCOPY;  Surgeon: Lin Landsman, MD;  Location[facility] ENDOSCOPY;  Service: Gastroenterology;  Laterality: N/A;  . ESOPHAGOGASTRODUODENOSCOPY N/A 07/13/2018   Procedure: ESOPHAGOGASTRODUODENOSCOPY (EGD);   Surgeon: Toledo, Benay Pike, MD;  Location: [facility] ENDOSCOPY;  Service: Gastroenterology;  Laterality: N/A;  . ESOPHAGOGASTRODUODENOSCOPY N/A 09/14/2018   Procedure: ESOPHAGOGASTRODUODENOSCOPY (EGD);  Surgeon: Toledo, Benay Pike, MD;  Location: [facility] ENDOSCOPY;  Service: Gastroenterology;  Laterality: N/A;  SIGN LANAGUAGE INTERPRETER  . ESOPHAGOGASTRODUODENOSCOPY N/A 06/21/2019   Procedure: ESOPHAGOGASTRODUODENOSCOPY (EGD);  Surgeon: Toledo, Benay Pike, MD;  Location: [facility] ENDOSCOPY;  Service: Gastroenterology;  Laterality: N/A;  . ESOPHAGOGASTRODUODENOSCOPY (EGD) WITH PROPOFOL N/A 05/27/2018   Procedure: ESOPHAGOGASTRODUODENOSCOPY (EGD) WITH PROPOFOL;  Surgeon: Clarene Essex, MD;  Location: [place];  Service: Endoscopy;  Laterality: N/A;  . ICD LEAD REMOVAL Left 08/25/2019   Procedure: ICD LEAD EXTRACTION;  Surgeon: [name], MD;  Location: [place];  Service: Cardiovascular;  Laterality: Left;  DR. Roxan Hockey BACK UP  . INSERT / REPLACE / REMOVE PACEMAKER    . LEFT HEART CATHETERIZATION WITH CORONARY/GRAFT ANGIOGRAM N/A 12/10/2011   Procedure: LEFT HEART CATHETERIZATION WITH Beatrix Fetters;  Surgeon: Sanda Klein, MD;  Location: [place] CATH LAB;  Service: Cardiovascular;  Laterality: N/A;  . LEFT HEART CATHETERIZATION WITH CORONARY/GRAFT ANGIOGRAM N/A 09/25/2013   Procedure: LEFT HEART CATHETERIZATION WITH Beatrix Fetters;  Surgeon: Blane Ohara, MD;  Location: [facility]  CATH LAB;  Service: Cardiovascular;  Laterality: N/A;  . MULTIPLE EXTRACTIONS WITH ALVEOLOPLASTY Bilateral 08/30/2019   Procedure: Extractionof tooth #'s 2, 28, and 31 with alveoloplasty and gross debridement of remaining teeth.;  Surgeon: [name], DDS;  Location: [place];  Service: Oral Surgery;  Laterality: Bilateral;  . Persantine Myoview  05/06/2010   Post-rest ejection fraction 30%. No significant ischemia demonstrated. Compared to previous study there is no significant change.  . TEE WITHOUT CARDIOVERSION  N/A 06/22/2019   Procedure: TRANSESOPHAGEAL ECHOCARDIOGRAM (TEE);  Surgeon: Minna Merritts, MD;  Location: [facility] ORS;  Service: Cardiovascular;  Laterality: N/A;  . TEE WITHOUT CARDIOVERSION N/A 08/23/2019   Procedure: TRANSESOPHAGEAL ECHOCARDIOGRAM (TEE);  Surgeon: Kate Sable, MD;  Location: [facility] ORS;  Service: Cardiovascular;  Laterality: N/A;  . TEE WITHOUT CARDIOVERSION N/A 08/25/2019   Procedure: TRANSESOPHAGEAL ECHOCARDIOGRAM (TEE);  Surgeon: [name], MD;  [facility]: [facility] OR;  Service: Cardiovascular;  Laterality: N/A;  . TRANSURETHRAL RESECTION OF PROSTATE     s/p    Current Medications: Outpatient Medications Prior to Visit  Medication Sig Dispense Refill  . acetaminophen (TYLENOL) 325 MG tablet Take 2 tablets (650 mg total) by mouth every 6 (six) hours as needed for mild pain or fever.    [name] albuterol (PROVENTIL) (2.5 MG/3ML) 0.083% nebulizer solution Inhale 3 mLs into the lungs every 4 (four) hours as needed for wheezing or shortness of breath. 75 mL 12  . ferrous sulfate 325 (65 FE) MG tablet Take 1 tablet (325 mg total) by mouth 2 (two) times daily with a meal. 60 tablet 0  . midodrine (PROAMATINE) 5 MG tablet Take 1 tablet (5 mg total) by mouth 3 (three) times daily with meals. 90 tablet 1  . Multiple Vitamin (MULTIVITAMIN WITH MINERALS) TABS tablet Take 1 tablet by mouth daily.    [name] Nystatin (GERHARDT'S BUTT CREAM) CREA Apply 1 application topically 2 (two) times daily.    . ondansetron (ZOFRAN) 4 MG tablet Take 1 tablet (4 mg total) by mouth every 8 (eight) hours as needed for nausea or vomiting. 30 tablet 1  . pantoprazole (PROTONIX) 40 MG tablet Take 1 tablet (40 mg total) by mouth 2 (two) times daily for 14 days, THEN 1 tablet (40 mg total) daily. 104 tablet 0  . simvastatin (ZOCOR) 20 MG tablet TAKE 1 TABLET BY MOUTH EVERY DAY (Patient taking differently: Take 20 mg by mouth daily. ) 90 tablet 2  . spironolactone (ALDACTONE) 25 MG tablet Take 1 tablet (25 mg total)  by mouth daily. 30 tablet 3  . torsemide (DEMADEX) 20 MG tablet Take 2 tablets (40 mg total) by mouth 2 (two) times daily. 240 tablet 3  . dextromethorphan-guaiFENesin (MUCINEX DM) 30-600 MG 12hr tablet Take 1 tablet by mouth 2 (two) times daily as needed for cough.    [name] ipratropium-albuterol (DUONEB) 0.5-2.5 (3) MG/3ML SOLN Take 3 mLs by nebulization every 6 (six) hours. 360 mL   . polyethylene glycol (MIRALAX / GLYCOLAX) 17 g packet Take 17 g by mouth daily as needed for mild constipation. 14 each 0   No facility-administered medications prior to visit.     Allergies:   Entresto [sacubitril-valsartan], Phenazopyridine, and Ramipril   Social History   Socioeconomic History  . Marital status: Married    Spouse name: Not on file  . Number of children: Not on file  . Years of education: Not on file  . Highest education level: Not on file  Occupational History  . Not  on file  Tobacco Use  . Smoking status: Former Smoker    Quit date: 03/15/1985    Years since quitting: 34.6  . Smokeless tobacco: Never Used  Vaping Use  . Vaping Use: Never used  Substance and Sexual Activity  . Alcohol use: No    Comment: occas.  . Drug use: No  . Sexual activity: Yes  Other Topics Concern  . Not on file  Social History Narrative  . Not on file   Social Determinants of Health   Financial Resource Strain: Low Risk   . Difficulty of Paying Living Expenses: Not hard at all  Food Insecurity: No Food Insecurity  . Worried About Charity fundraiser in the Last Year: Never true  . Ran Out of Food in the Last Year: Never true  Transportation Needs: No Transportation Needs  . Lack of Transportation (Medical): No  . Lack of Transportation (Non-Medical): No  Physical Activity: Inactive  . Days of Exercise per Week: 0 days  . Minutes of Exercise per Session: 0 min  Stress: No Stress Concern Present  . Feeling of Stress : Not at all  Social Connections: Moderately Integrated  . Frequency of  Communication with Friends and Family: Never  . Frequency of Social Gatherings with Friends and Family: Never  . Attends Religious Services: 1 to 4 times per year  . Active Member of Clubs or Organizations: Yes  . Attends Archivist Meetings: 1 to 4 times per year  . Marital Status: Married     Family History:  The patient's family history includes Hypertension in his father.   ROS:   Please see the history of present illness.    ROS All other systems are reviewed and are negative.   PHYSICAL EXAM:   VS:  BP 110/65   Pulse 75   Ht 6\' 2"  (1.88 m)   Wt 188 lb 6.4 oz (85.5 kg)   SpO2 95%   BMI 24.19 kg/m    General: Alert, oriented x3, no distress, he is thinner than in the past, but appears comfortable and is less pale.  New right subclavian device site as well as old left subclavian sites both superior well-healed without evidence of swelling or erythema or drainage. Head: no evidence of trauma, PERRL, EOMI, no exophtalmos or lid lag, no myxedema, no xanthelasma; normal ears, nose and oropharynx Neck: normal jugular venous pulsations and no hepatojugular reflux; brisk carotid pulses without delay and no carotid bruits Chest: clear to auscultation, no signs of consolidation by percussion or palpation, normal fremitus, symmetrical and full respiratory excursions Cardiovascular: normal position and quality of the apical impulse, regular rhythm, normal first and paradoxically split second heart sounds, 3/6 holosystolic murmur left lower sternal border no diastolic murmurs, rubs or gallops Abdomen: no tenderness or distention, no masses by palpation, no abnormal pulsatility or arterial bruits, normal bowel sounds, no hepatosplenomegaly Extremities: no clubbing, cyanosis; he has 2+ symmetrical swelling of the feet and ankles, pitting edema; 2+ radial, ulnar and brachial pulses bilaterally; 2+ right femoral, posterior tibial and dorsalis pedis pulses; 2+ left femoral, posterior tibial  and dorsalis pedis pulses; no subclavian or femoral bruits Neurological: grossly nonfocal Psych: Normal mood and affect   Wt Readings from Last 3 Encounters:  10/24/19 187 lb (84.8 kg)  10/23/19 188 lb 6.4 oz (85.5 kg)  09/12/19 213 lb 3 oz (96.7 kg)      Studies/Labs Reviewed:   ECHO 08/23/2019:  1. Left ventricular ejection fraction, by  estimation, is 55 to 60%. The  left ventricle has normal function. The left ventricle has no regional  wall motion abnormalities.  2. Right ventricular systolic function is normal. The right ventricular  size is normal.  3. Left atrial size was mild to moderately dilated. No left atrial/left  atrial appendage thrombus was detected.  4. Right atrial size was mildly dilated.  5. The mitral valve is normal in structure. Mild mitral valve  regurgitation.  6. Tricuspid valve regurgitation is moderate to severe.  7. The aortic valve is tricuspid. Aortic valve regurgitation is mild.  EKG:  EKG is ordered today.  Atrial fibrillation with 100% ventricular pacing positive initial R in V1.  QTc 509 ms  Recent Labs: 05/19/2019: TSH 2.407 09/05/2019: B Natriuretic Peptide 403.5 09/12/2019: Magnesium 2.1 10/24/2019: ALT 15; BUN 36; Creatinine, Ser 1.35; Hemoglobin 9.2; Platelets 263; Potassium 3.9; Sodium 142   Lipid Panel    Component Value Date/Time   CHOL 92 11/01/2015 0331   CHOL 139 11/20/2011 0624   TRIG 76 11/01/2015 0331   TRIG 108 11/20/2011 0624   HDL 31 (L) 11/01/2015 0331   HDL 26 (L) 11/20/2011 0624   CHOLHDL 3.0 11/01/2015 0331   VLDL 15 11/01/2015 0331   VLDL 22 11/20/2011 0624   LDLCALC 46 11/01/2015 0331   LDLCALC 91 11/20/2011 0624     ASSESSMENT:    1. Chronic right-sided congestive heart failure ([place])   2. Normocytic anemia   3. Coronary artery disease involving coronary bypass graft of native heart without angina pectoris   4. Permanent atrial fibrillation ([place])   5. CHB (complete heart block) (HCC)   6.  Biventricular automatic implantable cardioverter defibrillator in situ   7. Pure hypercholesterolemia   8. Other specified hypotension   9. Nonrheumatic tricuspid valve regurgitation   10. Bilateral deafness     PLAN:  In order of problems listed above:  1. CHF: He is showing some signs of hypervolemia, but does not have symptoms of left heart failure.  LVEF normal by most recent echo in June 2021, but with moderate to severe TR.  Reviewed sodium dietary restriction.  His favorite foods are all very rich in sodium, especially when he chooses and processed meats.  Discussed the fact that even low fat daily meats such as [place] breast still has way too much sodium from his diet.  He loves hotdogs.  Many times cooking with canned foods.  Reviewed the importance of looking at nutritional labels with both [place] and his wife.  Discussed weight monitoring.  Will have to reestablish his "dry weight" but I suspect it is around 185 pounds or less.  He now has manifestations primarily of right heart failure.  Will likely have a larger "buffer" with volume gain.[name]  He will likely be gaining some real weight also, as he recovers from his protracted illness. 2. Anemia: Initially appeared to be related to acute bleeding from GI angiectasia, likely compounded by anemia of chronic illness and infection.  Remains moderately anemic with a hemoglobin of 9.2.  Normocytic, normochromic but with very broad RDW.  No longer on iron supplements. 3. CAD: No recent problems with angina pectoris (last had angina when he was severely anemic year ago). He is status post CABG and PCI of SVG to OM. Patent LIMA to LAD. Occluded native arteries and occluded SVG to RCA.  He is currently not receiving any antianginal medications due to his low blood pressure. 4. AFib: Never has RVR. CHADSVasc 7 (  age 50, TIA 2, HTN, CAD, CHF).  He remains off anticoagulation due to GI bleeding and anemia. 5. High-grade AV block/CHB: Has underlying escape  rhythm <40. He is not device dependent, but is 97% BiV paced. 6. CRT-D: Device extraction and reimplantation due to infection.  (Despite his age, a heavy ICD lead was used, rather than downgrade to CRT-P due to severe TR).  Normal device function today.  Followed by Dr. [name].  Remote downloads every 3 months.  OptiVol is just now becoming "mature".  Will enroll him into the heart failure device clinic with monthly downloads. 7. Hyperlipidemia on simvastatin.  Wait for him to achieve a new metabolic steady state before rechecking lipid profile. 8. Hypotension: History of hypertension.  Currently hypotensive on midodrine. 9. TR: Part of his hemodynamic changes likely related to right ventricular failure due to severe tricuspid regurgitation.  10.  Congenital deafness - always needs a sign language interpreter  Medication Adjustments/Labs and Tests Ordered: Current medicines are reviewed at length with the patient today.  Concerns regarding medicines are outlined above.  Medication changes, Labs and Tests ordered today are listed in the Patient Instructions below.  Patient Instructions  Medication Instructions:  No changes *If you need a refill on your cardiac medications before your next appointment, please call your pharmacy*   Lab Work: None ordered If you have labs (blood work) drawn today and your tests are completely normal, you will receive your results only by: [name] MyChart Message (if you have MyChart) OR . A paper copy in the mail If you have any lab test that is abnormal or we need to change your treatment, we will call you to review the results.   Testing/Procedures: None ordered   Follow-Up: At [facility], you and your health needs are our priority.  As part of our continuing mission to provide you with exceptional heart care, we have created designated Provider Care Teams.  These Care Teams include your primary Cardiologist (physician) and Advanced Practice Providers (APPs  -  Physician Assistants and Nurse Practitioners) who all work together to provide you with the care you need, when you need it.  We recommend signing up for the patient portal called "MyChart".  Sign up information is provided on this After Visit Summary.  MyChart is used to connect with patients for Virtual Visits (Telemedicine).  Patients are able to view lab/test results, encounter notes, upcoming appointments, etc.  Non-urgent messages can be sent to your provider as well.   To learn more about what you can do with MyChart, go to [email or web address].    Your next appointment:   3 month(s)  The format for your next appointment:   In Person  Provider:   Sanda Klein, MD   Other Instructions Please call if your weight is 195 pounds and above.    Signed, Sanda Klein, MD  10/26/2019 8:47 AM    [facility] [place], [place], [place]  [address] Phone: [phone]; Fax: [phone]

## 2019-10-23 NOTE — Patient Instructions (Signed)
Medication Instructions:  No changes *If you need a refill on your cardiac medications before your next appointment, please call your pharmacy*   Lab Work: None ordered If you have labs (blood work) drawn today and your tests are completely normal, you will receive your results only by:  [place] (if you have MyChart) OR  A paper copy in the mail If you have any lab test that is abnormal or we need to change your treatment, we will call you to review the results.   Testing/Procedures: None ordered   Follow-Up: At [facility], you and your health needs are our priority.  As part of our continuing mission to provide you with exceptional heart care, we have created designated Provider Care Teams.  These Care Teams include your primary Cardiologist (physician) and Advanced Practice Providers (APPs -  Physician Assistants and Nurse Practitioners) who all work together to provide you with the care you need, when you need it.  We recommend signing up for the patient portal called "MyChart".  Sign up information is provided on this After Visit Summary.  MyChart is used to connect with patients for Virtual Visits (Telemedicine).  Patients are able to view lab/test results, encounter notes, upcoming appointments, etc.  Non-urgent messages can be sent to your provider as well.   To learn more about what you can do with MyChart, go to [email or web address].    Your next appointment:   3 month(s)  The format for your next appointment:   In Person  Provider:   Sanda Klein, MD   Other Instructions Please call if your weight is 195 pounds and above.

## 2019-10-24 ENCOUNTER — Telehealth: Payer: Self-pay | Admitting: Cardiovascular Disease

## 2019-10-24 ENCOUNTER — Other Ambulatory Visit: Payer: Self-pay

## 2019-10-24 ENCOUNTER — Emergency Department
Admission: EM | Admit: 2019-10-24 | Discharge: 2019-10-24 | Disposition: A | Discharge status: Home / Self Care | Payer: PPO | Attending: Emergency Medicine | Admitting: Emergency Medicine

## 2019-10-24 ENCOUNTER — Encounter: Payer: Self-pay | Admitting: Emergency Medicine

## 2019-10-24 ENCOUNTER — Emergency Department: Payer: PPO

## 2019-10-24 DIAGNOSIS — D631 Anemia in chronic kidney disease: Secondary | ICD-10-CM | POA: Diagnosis not present

## 2019-10-24 DIAGNOSIS — I2510 Atherosclerotic heart disease of native coronary artery without angina pectoris: Secondary | ICD-10-CM | POA: Diagnosis not present

## 2019-10-24 DIAGNOSIS — N183 Chronic kidney disease, stage 3 unspecified: Secondary | ICD-10-CM | POA: Diagnosis not present

## 2019-10-24 DIAGNOSIS — I509 Heart failure, unspecified: Secondary | ICD-10-CM | POA: Diagnosis not present

## 2019-10-24 DIAGNOSIS — I959 Hypotension, unspecified: Secondary | ICD-10-CM | POA: Diagnosis not present

## 2019-10-24 DIAGNOSIS — Z8673 Personal history of transient ischemic attack (TIA), and cerebral infarction without residual deficits: Secondary | ICD-10-CM | POA: Diagnosis not present

## 2019-10-24 DIAGNOSIS — L89151 Pressure ulcer of sacral region, stage 1: Secondary | ICD-10-CM | POA: Diagnosis not present

## 2019-10-24 DIAGNOSIS — I130 Hypertensive heart and chronic kidney disease with heart failure and stage 1 through stage 4 chronic kidney disease, or unspecified chronic kidney disease: Secondary | ICD-10-CM | POA: Insufficient documentation

## 2019-10-24 DIAGNOSIS — I872 Venous insufficiency (chronic) (peripheral): Secondary | ICD-10-CM | POA: Diagnosis not present

## 2019-10-24 DIAGNOSIS — Z87891 Personal history of nicotine dependence: Secondary | ICD-10-CM | POA: Diagnosis not present

## 2019-10-24 DIAGNOSIS — I5042 Chronic combined systolic (congestive) and diastolic (congestive) heart failure: Secondary | ICD-10-CM | POA: Diagnosis not present

## 2019-10-24 DIAGNOSIS — Z8546 Personal history of malignant neoplasm of prostate: Secondary | ICD-10-CM | POA: Diagnosis not present

## 2019-10-24 DIAGNOSIS — I252 Old myocardial infarction: Secondary | ICD-10-CM | POA: Diagnosis not present

## 2019-10-24 DIAGNOSIS — Z9181 History of falling: Secondary | ICD-10-CM | POA: Diagnosis not present

## 2019-10-24 DIAGNOSIS — L539 Erythematous condition, unspecified: Secondary | ICD-10-CM | POA: Diagnosis not present

## 2019-10-24 DIAGNOSIS — I255 Ischemic cardiomyopathy: Secondary | ICD-10-CM | POA: Diagnosis not present

## 2019-10-24 DIAGNOSIS — H918X3 Other specified hearing loss, bilateral: Secondary | ICD-10-CM | POA: Diagnosis not present

## 2019-10-24 DIAGNOSIS — I714 Abdominal aortic aneurysm, without rupture: Secondary | ICD-10-CM | POA: Diagnosis not present

## 2019-10-24 DIAGNOSIS — Z951 Presence of aortocoronary bypass graft: Secondary | ICD-10-CM | POA: Diagnosis not present

## 2019-10-24 DIAGNOSIS — Z950 Presence of cardiac pacemaker: Secondary | ICD-10-CM | POA: Diagnosis not present

## 2019-10-24 DIAGNOSIS — R600 Localized edema: Secondary | ICD-10-CM | POA: Diagnosis not present

## 2019-10-24 DIAGNOSIS — I890 Lymphedema, not elsewhere classified: Secondary | ICD-10-CM | POA: Diagnosis not present

## 2019-10-24 DIAGNOSIS — I480 Paroxysmal atrial fibrillation: Secondary | ICD-10-CM | POA: Diagnosis not present

## 2019-10-24 DIAGNOSIS — L309 Dermatitis, unspecified: Secondary | ICD-10-CM | POA: Diagnosis not present

## 2019-10-24 DIAGNOSIS — M6259 Muscle wasting and atrophy, not elsewhere classified, multiple sites: Secondary | ICD-10-CM | POA: Diagnosis not present

## 2019-10-24 DIAGNOSIS — E785 Hyperlipidemia, unspecified: Secondary | ICD-10-CM | POA: Diagnosis not present

## 2019-10-24 DIAGNOSIS — E8809 Other disorders of plasma-protein metabolism, not elsewhere classified: Secondary | ICD-10-CM | POA: Diagnosis not present

## 2019-10-24 LAB — CBC WITH DIFFERENTIAL/PLATELET
Abs Immature Granulocytes: 0.02 10*3/uL (ref 0.00–0.07)
Basophils Absolute: 0.1 10*3/uL (ref 0.0–0.1)
Basophils Relative: 1 %
Eosinophils Absolute: 0.2 10*3/uL (ref 0.0–0.5)
Eosinophils Relative: 3 %
HCT: 29.2 % — ABNORMAL LOW (ref 39.0–52.0)
Hemoglobin: 9.2 g/dL — ABNORMAL LOW (ref 13.0–17.0)
Immature Granulocytes: 0 %
Lymphocytes Relative: 15 %
Lymphs Abs: 0.9 10*3/uL (ref 0.7–4.0)
MCH: 26.7 pg (ref 26.0–34.0)
MCHC: 31.5 g/dL (ref 30.0–36.0)
MCV: 84.6 fL (ref 80.0–100.0)
Monocytes Absolute: 0.6 10*3/uL (ref 0.1–1.0)
Monocytes Relative: 11 %
Neutro Abs: 4.1 10*3/uL (ref 1.7–7.7)
Neutrophils Relative %: 70 %
Platelets: 263 10*3/uL (ref 150–400)
RBC: 3.45 MIL/uL — ABNORMAL LOW (ref 4.22–5.81)
RDW: 19 % — ABNORMAL HIGH (ref 11.5–15.5)
WBC: 5.8 10*3/uL (ref 4.0–10.5)
nRBC: 0 % (ref 0.0–0.2)

## 2019-10-24 LAB — COMPREHENSIVE METABOLIC PANEL
ALT: 15 U/L (ref 0–44)
AST: 30 U/L (ref 15–41)
Albumin: 2.9 g/dL — ABNORMAL LOW (ref 3.5–5.0)
Alkaline Phosphatase: 119 U/L (ref 38–126)
Anion gap: 9 (ref 5–15)
BUN: 36 mg/dL — ABNORMAL HIGH (ref 8–23)
CO2: 30 mmol/L (ref 22–32)
Calcium: 8.7 mg/dL — ABNORMAL LOW (ref 8.9–10.3)
Chloride: 103 mmol/L (ref 98–111)
Creatinine, Ser: 1.35 mg/dL — ABNORMAL HIGH (ref 0.61–1.24)
GFR calc Af Amer: 56 mL/min — ABNORMAL LOW (ref >60)
GFR calc non Af Amer: 49 mL/min — ABNORMAL LOW (ref >60)
Glucose, Bld: 106 mg/dL — ABNORMAL HIGH (ref 70–99)
Potassium: 3.9 mmol/L (ref 3.5–5.1)
Sodium: 142 mmol/L (ref 135–145)
Total Bilirubin: 0.7 mg/dL (ref 0.3–1.2)
Total Protein: 6 g/dL — ABNORMAL LOW (ref 6.5–8.1)

## 2019-10-24 LAB — LACTIC ACID, PLASMA: Lactic Acid, Venous: 0.7 mmol/L (ref 0.5–1.9)

## 2019-10-24 NOTE — ED Triage Notes (Signed)
Patient presents to the ED with leg redness and pain.  Through ASL interpreter, patient's wife states that patient has had some redness for approx. 1 month but it seems to be getting worse and their home healthcare RN encouraged them to come to the hospital.

## 2019-10-24 NOTE — Telephone Encounter (Signed)
Wife of the patient called using a Copy. The Wife wanted to verify all of the patient's medications. She was not able to do so at the patient's appointment yesterday.  Please contact the wife to verify the patient's medication

## 2019-10-24 NOTE — Discharge Instructions (Addendum)
Walk as much as possible to keep the blood flow circulating in your legs.  When not walking, try to move your legs a couple of times an hour.  This does not have to be significant movement, just activating the muscles of the lower legs will improve blood flow.  Wear compression stockings as much as tolerated.  Most times you will have to start with 1 to 2 hours of wearing the stockings and can increase the time wearing them as tolerated.  Have home health nurse keep an eye on the legs to ensure no worsening symptoms.  If you see significant swelling, significant redness, any kinds of skin breakdown or wounds please have him reevaluated.

## 2019-10-24 NOTE — Telephone Encounter (Signed)
Called patient wife back-  She discussed medications, they were all correct on the med list. Will update and notify nurse.

## 2019-10-24 NOTE — Telephone Encounter (Signed)
Patient's wife, [name], called in with sign language interpreter on the phone. She states, per [place], the patient needs to be seen today due to cellulitis and swelling/redness all over patient's feet. I informed the patient that Dr. Christiana Pellant do not have any availability today, but she insisted that the patient must be seen. Made her aware I would forward her request to clinical staff. She states a call may be returned to [place] with [facility] at [phone].

## 2019-10-24 NOTE — ED Provider Notes (Signed)
[facility] Emergency Department Provider Note  ____________________________________________  Time seen: Approximately 9:30 PM  I have reviewed the triage vital signs and the nursing notes.   HISTORY  Chief Complaint Leg Problem  Video interpreter with ASL interpreter was used  HPI Thomas [name] is a 85 y.o. male who presents the emergency department with his wife for complaint of possible edema, erythema and skin changes of the lower leg.  Patient has a home health nurse that comes out and evaluate some on a regular basis.  There was some concern that the patient's leg appeared more shiny than normal.  He has had no pain.  Subjective erythema where the patient does not believe there is an area of erythema but the wife feels that there was slight erythema with the "shininess."  No wounds are reported.  Patient has a history of CHF, CKD, coronary artery disease, AAA, hypertension, ischemic cardiomyopathy, prostate cancer, anasarca.  Other than skin changes to the leg no other complaints at this time.         Past Medical History:  Diagnosis Date   AICD (automatic cardioverter/defibrillator) present    Bacteremia due to group B Streptococcus    Recurrent admissions for group B Strepotococcus bacteremia of unknown source with TEEs negative for vegetation 06/2019 and 08/2019   Biventricular ICD (implantable cardioverter-defibrillator) in place 03/24/2005   Implantation of a Medtronic Adapta ADDRO1, serial number [id number]   CHF (congestive heart failure) (HCC)    CKD (chronic kidney disease), stage III    Coronary artery disease    a. s/p CABG 1986. b. Multiple PCIs/caths. c. 09/2013: s/p PTCA and BMS to SVG-OM.   Deaf    Requires sign language interpreter   Dysrhythmia    History of abdominal aortic aneurysm    History of bleeding peptic ulcer 1980   History of epididymitis 2013   HTN (hypertension)    Hydronephrosis with ureteropelvic junction  obstruction    Hydroureter on left 2009   Hypertension    Ischemic cardiomyopathy    a. Prior EF 30-35%, s/p BIV-ICD. b. 09/2013: EF 45-50%.   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   Candidal intertrigo 08/24/2019   Edema of left upper extremity 08/24/2019   Pneumonia of left lower lobe due to group B Streptococcus ([place]) 08/24/2019   Heart block AV complete ([place]) 08/23/2019   Sepsis due to group B Streptococcus ([place]) 08/20/2019   Acute blood loss anemia 08/20/2019   HCAP (healthcare-associated pneumonia) 08/18/2019   Severe sepsis with septic shock ([place]) 08/18/2019   Acute renal failure superimposed on stage 3a chronic kidney disease ([place]) 08/18/2019   Black stool 08/18/2019   Iron deficiency anemia 08/18/2019   Group B streptococcal bacteriuria 06/18/2019   Lactic acidosis 06/18/2019   Pressure ulcer, stage 2 ([place]) 06/18/2019   Bacteremia due to group B Streptococcus 06/17/2019   SIRS (systemic inflammatory response syndrome) ([place]) 06/16/2019   Leg swelling    Decompensated hepatic cirrhosis ([place]) 05/22/2019  Anasarca 05/19/2019   CRI (chronic renal insufficiency), stage 3 (moderate) 01/30/2019   Iron deficiency anemia due to chronic blood loss 11/02/2018   Coronary artery disease of bypass graft of native heart with stable angina pectoris ([place]) 11/02/2018   Pure hypercholesterolemia 11/02/2018   Biventricular automatic implantable cardioverter defibrillator in situ 11/02/2018   GIB (gastrointestinal bleeding) 09/14/2018    Hypotension 75/91/6384   Chronic systolic CHF (congestive heart failure) ([place]) 08/12/2018   Acute on chronic combined systolic and diastolic CHF (congestive heart failure) ([place])    GI bleed 05/26/2018   Occult GI bleeding 05/25/2018   Normocytic anemia 05/25/2018   Elevated troponin 05/25/2018   Lymphedema 02/28/2018   Weakness 07/15/2016   Fatigue 07/15/2016   Stroke (cerebrum) ([place]) 10/31/2015   Bulbous urethral stricture 09/18/2015   Bilateral deafness 08/19/2015   Bilateral cataracts 08/19/2015   Acid reflux 08/19/2015   Mixed hyperlipidemia 08/19/2015   Essential hypertension 08/19/2015   Myocardial infarction ([place]) 08/19/2015   Calculus of kidney 08/19/2015   Pacemaker 08/19/2015   Gastroduodenal ulcer 08/19/2015   Dupuytren's contracture of foot 08/19/2015   Malignant neoplasm of prostate ([place]) 08/19/2015   Microhematuria 08/19/2015   CAD S/P multiple PCIs 02/22/2015   Status post coronary artery bypass grafting 02/22/2015   Benign essential HTN 04/02/2014   Hematochezia 02/20/2014   Tricuspid valve regurgitation, nonrheumatic 02/20/2014   Mobitz type II atrioventricular block 12/12/2013   Long term current use of anticoagulant 10/18/2013   Cardiomyopathy, ischemic 11/11/2012   Permanent atrial fibrillation ([place]) 11/11/2012   Biventricular ICD in place 11/11/2012   Chronic combined systolic and diastolic heart failure ([place]) 11/11/2012    Past Surgical History:  Procedure Laterality Date   2-D echocardiogram  11/20/2011   Ejection fraction 30-35% moderate concentric left ventricular hypertrophy. Left atrium is moderately dilated. Mild MR. Mild or   BI-VENTRICULAR IMPLANTABLE CARDIOVERTER DEFIBRILLATOR N/A 12/16/2012   Procedure: BI-VENTRICULAR IMPLANTABLE CARDIOVERTER DEFIBRILLATOR  (CRT-D);  Surgeon: [name], MD;  Location: [facility] CATH LAB;  Service: Cardiovascular;  Laterality: N/A;   BIV ICD INSERTION CRT-D N/A 09/01/2019    Procedure: BIV ICD INSERTION CRT-D;  Surgeon: [name], MD;  Location: [place] CV LAB;  Service: Cardiovascular;  Laterality: N/A;   CARDIAC CATHETERIZATION  12/10/2011   SVG to OM widely patent.  LIMA to LAD patent   CATARACT EXTRACTION W/PHACO Right 10/12/2017   Procedure: CATARACT EXTRACTION PHACO AND INTRAOCULAR LENS PLACEMENT (IOC);  Surgeon: Birder Robson, MD;  Location: [facility] ORS;  Service: Ophthalmology;  Laterality: Right;  [place] 00:57 AP% 15.9 CDE 9.07 Fluid pack lot # [id number]   COLONOSCOPY N/A 07/13/2018   Procedure: COLONOSCOPY;  Surgeon: Toledo, Benay Pike, MD;  Location: [facility] ENDOSCOPY;  Service: Gastroenterology;  Laterality: N/A;   CORONARY ARTERY BYPASS GRAFT  1986   ENTEROSCOPY N/A 09/14/2018   Procedure: ENTEROSCOPY;  Surgeon: Toledo, Benay Pike, MD;  Location: [facility] ENDOSCOPY;  Service: Gastroenterology;  Laterality: N/A;  symptomatic anemia, GI blood loss anemia, melena, positive small bowel capsule endoscopy showing source of bleeding    ENTEROSCOPY N/A 08/22/2019   Procedure: ENTEROSCOPY;  Surgeon: Lin Landsman, MD;  Location: [place];  Service: Gastroenterology;  Laterality: N/A;   ESOPHAGOGASTRODUODENOSCOPY N/A 07/13/2018   Procedure: ESOPHAGOGASTRODUODENOSCOPY (EGD);  Surgeon: Toledo, Benay Pike, MD;  Location: [facility] ENDOSCOPY;  Service: Gastroenterology;  Laterality: N/A;   ESOPHAGOGASTRODUODENOSCOPY N/A 09/14/2018   Procedure: ESOPHAGOGASTRODUODENOSCOPY (EGD);  Surgeon: Toledo, Benay Pike, MD;  Location: [facility] ENDOSCOPY;  Service: Gastroenterology;  Laterality: N/A;  SIGN LANAGUAGE INTERPRETER   ESOPHAGOGASTRODUODENOSCOPY N/A 06/21/2019   Procedure: ESOPHAGOGASTRODUODENOSCOPY (EGD);  Surgeon: Toledo, Benay Pike, MD;  Location: [facility] ENDOSCOPY;  Service: Gastroenterology;  Laterality: N/A;   ESOPHAGOGASTRODUODENOSCOPY (EGD) WITH PROPOFOL N/A 05/27/2018   Procedure: ESOPHAGOGASTRODUODENOSCOPY (EGD) WITH PROPOFOL;  Surgeon: Clarene Essex, MD;  Location:  [place];  Service: Endoscopy;  Laterality: N/A;   ICD LEAD REMOVAL Left 08/25/2019   Procedure: ICD LEAD EXTRACTION;  Surgeon: [name], MD;  Location: [facility] OR;  Service: Cardiovascular;  Laterality: Left;  DR. Roxan Hockey BACK UP   INSERT / REPLACE / REMOVE PACEMAKER     LEFT HEART CATHETERIZATION WITH CORONARY/GRAFT ANGIOGRAM N/A 12/10/2011   Procedure: LEFT HEART CATHETERIZATION WITH Beatrix Fetters;  Surgeon: Sanda Klein, MD;  Location: [place] CATH LAB;  Service: Cardiovascular;  Laterality: N/A;   LEFT HEART CATHETERIZATION WITH CORONARY/GRAFT ANGIOGRAM N/A 09/25/2013   Procedure: LEFT HEART CATHETERIZATION WITH Beatrix Fetters;  Surgeon: Blane Ohara, MD;  Location: [facility] CATH LAB;  Service: Cardiovascular;  Laterality: N/A;   MULTIPLE EXTRACTIONS WITH ALVEOLOPLASTY Bilateral 08/30/2019   Procedure: Extractionof tooth #'s 2, 28, and 31 with alveoloplasty and gross debridement of remaining teeth.;  Surgeon: [name], DDS;  Location: [place];  Service: Oral Surgery;  Laterality: Bilateral;   Persantine Myoview  05/06/2010   Post-rest ejection fraction 30%. No significant ischemia demonstrated. Compared to previous study there is no significant change.   TEE WITHOUT CARDIOVERSION N/A 06/22/2019   Procedure: TRANSESOPHAGEAL ECHOCARDIOGRAM (TEE);  Surgeon: Minna Merritts, MD;  Location: [facility] ORS;  Service: Cardiovascular;  Laterality: N/A;   TEE WITHOUT CARDIOVERSION N/A 08/23/2019   Procedure: TRANSESOPHAGEAL ECHOCARDIOGRAM (TEE);  Surgeon: Kate Sable, MD;  Location: [facility] ORS;  Service: Cardiovascular;  Laterality: N/A;   TEE WITHOUT CARDIOVERSION N/A 08/25/2019   Procedure: TRANSESOPHAGEAL ECHOCARDIOGRAM (TEE);  Surgeon: [name], MD;  Location: [facility] OR;  Service: Cardiovascular;  Laterality: N/A;   TRANSURETHRAL RESECTION OF PROSTATE     s/p    Prior to Admission medications   Medication Sig Start Date End Date Taking? Authorizing  Provider  acetaminophen (TYLENOL) 325 MG tablet Take 2 tablets (650 mg total) by mouth every 6 (six) hours as needed for mild pain or fever. 08/23/19   Loletha Grayer, MD  albuterol (PROVENTIL) (2.5 MG/3ML) 0.083% nebulizer solution Inhale 3 mLs into the lungs every 4 (four) hours as needed for wheezing or shortness of breath. 08/23/19   Loletha Grayer, MD  ferrous sulfate 325 (65 FE) MG tablet Take 1 tablet (325 mg total) by mouth 2 (two) times daily with a meal. 09/11/19 10/23/19  Mercy Riding, MD  midodrine (PROAMATINE) 5 MG tablet Take 1 tablet (5 mg total) by mouth 3 (three) times daily with meals. 09/11/19   Mercy Riding, MD  Multiple Vitamin (MULTIVITAMIN WITH MINERALS) TABS tablet Take 1 tablet by mouth daily. 05/29/19   Ezekiel Slocumb, DO  Nystatin (GERHARDT'S BUTT CREAM) CREA Apply 1 application topically 2 (two) times daily. 09/11/19   Mercy Riding, MD  ondansetron (ZOFRAN) 4 MG tablet Take 1 tablet (4 mg total) by mouth every 8 (eight) hours as needed for nausea or vomiting. 09/12/19 09/11/20  Mercy Riding, MD  pantoprazole (PROTONIX) 40 MG tablet Take 1 tablet (40 mg total) by mouth 2 (two) times daily for 14 days, THEN 1 tablet (40 mg total) daily. 09/12/19 12/11/19  Mercy Riding, MD  simvastatin (ZOCOR) 20 MG tablet TAKE 1 TABLET BY MOUTH EVERY DAY  Patient taking differently: Take 20 mg by mouth daily.  04/07/19   Erlene Quan, PA-C  spironolactone (ALDACTONE) 25 MG tablet Take 1 tablet (25 mg total) by mouth daily. 08/01/19 10/30/19  Alisa Graff, FNP  torsemide (DEMADEX) 20 MG tablet Take 2 tablets (40 mg total) by mouth 2 (two) times daily. 09/11/19   Mercy Riding, MD    Allergies Entresto [sacubitril-valsartan], Phenazopyridine, and Ramipril  Family History  Problem Relation Age of Onset   Hypertension Father     Social History Social History   Tobacco Use   Smoking status: Former Smoker    Quit date: 03/15/1985    Years since quitting: 34.6   Smokeless tobacco:  Never Used  Vaping Use   Vaping Use: Never used  Substance Use Topics   Alcohol use: No    Comment: occas.   Drug use: No     Review of Systems  Constitutional: No fever/chills Eyes: No visual changes. No discharge ENT: No upper respiratory complaints. Cardiovascular: no chest pain. Respiratory: no cough. No SOB. Gastrointestinal: No abdominal pain.  No nausea, no vomiting.  No diarrhea.  No constipation. Musculoskeletal: Negative for musculoskeletal pain. Skin: Skin changes to the left lower extremity with "shininess" and possible erythema Neurological: Negative for headaches, focal weakness or numbness. 10-point ROS otherwise negative.  ____________________________________________   PHYSICAL EXAM:  VITAL SIGNS: ED Triage Vitals  Enc Vitals Group     BP 10/24/19 1928 (!) 108/54     Pulse Rate 10/24/19 1928 75     Resp 10/24/19 1928 18     Temp 10/24/19 1928 98.1 F (36.7 C)     Temp Source 10/24/19 1928 Oral     SpO2 10/24/19 1928 95 %     Weight 10/24/19 1715 187 lb (84.8 kg)     Height 10/24/19 1715 6\' 2"  (1.88 m)     Head Circumference --      Peak Flow --      Pain Score 10/24/19 1713 7     Pain Loc --      Pain Edu? --      Excl. in [place]? --      Constitutional: Alert and oriented. Well appearing and in no acute distress. Eyes: Conjunctivae are normal. PERRL. EOMI. Head: Atraumatic. ENT:      Ears:       Nose: No congestion/rhinnorhea.      Mouth/Throat: Mucous membranes are moist.  Neck: No stridor.    Cardiovascular: Normal rate, regular rhythm. Normal S1 and S2.  Good peripheral circulation. Respiratory: Normal respiratory effort without tachypnea or retractions. Lungs CTAB. Good air entry to the bases with no decreased or absent breath sounds. Musculoskeletal: Full range of motion to all extremities. No gross deformities appreciated.  Visualization of bilateral lower legs reveals no gross erythema or edema.  Patient does have skin findings  bilaterally, slightly worse on left than right consistent with venous stasis dermatitis.  There is no evidence of pressure ulcer.  No gross edema of one extremity versus the other.  Sensation intact bilateral lower extremities with no deficits.  Sensation equal.  Dorsalis pedis pulse appreciated bilateral lower extremities.  Capillary refill intact bilateral lower extremities. Neurologic:  Normal speech and language. No gross focal neurologic deficits are appreciated.  Skin:  Skin is warm, dry and intact. No rash noted. Psychiatric: Mood and affect are normal. Speech and behavior are normal. Patient exhibits appropriate insight and judgement.   ____________________________________________   LABS (all  labs ordered are listed, but only abnormal results are displayed)  Labs Reviewed  COMPREHENSIVE METABOLIC PANEL - Abnormal; Notable for the following components:      Result Value   Glucose, Bld 106 (*)    BUN 36 (*)    Creatinine, Ser 1.35 (*)    Calcium 8.7 (*)    Total Protein 6.0 (*)    Albumin 2.9 (*)    GFR calc non Af Amer 49 (*)    GFR calc Af Amer 56 (*)    All other components within normal limits  CBC WITH DIFFERENTIAL/PLATELET - Abnormal; Notable for the following components:   RBC 3.45 (*)    Hemoglobin 9.2 (*)    HCT 29.2 (*)    RDW 19.0 (*)    All other components within normal limits  LACTIC ACID, PLASMA  LACTIC ACID, PLASMA   ____________________________________________  EKG   ____________________________________________  RADIOLOGY I personally viewed and evaluated these images as part of my medical decision making, as well as reviewing the written report by the radiologist.  US Venous Img Lower Unilateral Left  Result Date: 10/24/2019 CLINICAL DATA:  Left leg redness swelling and pain. EXAM: LEFT LOWER EXTREMITY VENOUS DOPPLER ULTRASOUND TECHNIQUE: Gray-scale sonography with compression, as well as color and duplex ultrasound, were performed to evaluate the  deep venous system(s) from the level of the common femoral vein through the popliteal and proximal calf veins. COMPARISON:  None. FINDINGS: VENOUS Normal compressibility of the common femoral, superficial femoral, and popliteal veins, as well as the visualized calf veins. Visualized portions of profunda femoral vein and great saphenous vein unremarkable. No filling defects to suggest DVT on grayscale or color Doppler imaging. Doppler waveforms show normal direction of venous flow, normal respiratory plasticity and response to augmentation. Limited views of the contralateral common femoral vein are unremarkable. OTHER None. Limitations: Posterior tibial vein not well visualized. Peroneal vein not visualized IMPRESSION: Negative. Electronically Signed   By: Lajean Manes M.D.   On: 10/24/2019 18:35    ____________________________________________    PROCEDURES  Procedure(s) performed:    Procedures    Medications - No data to display   ____________________________________________   INITIAL IMPRESSION / ASSESSMENT AND PLAN / ED COURSE  Pertinent labs & imaging results that were available during my care of the patient were reviewed by me and considered in my medical decision making (see chart for details).  Review of the [place] CSRS was performed in accordance of the [place] prior to dispensing any controlled drugs.           Patient's diagnosis is consistent with venous stasis dermatitis.  Patient presents emergency department with concern for skin changes to the left lower extremity.  Patient had findings consistent venous stasis dermatitis.  No evidence of DVT on ultrasound.  No evidence of cellulitis on labs or physical exam.  No evidence of pressure ulcer.  At this time patient is to ambulate as much as possible, wear compression stockings.  Follow-up with primary care as needed..  No medications at this time.  Patient is given ED precautions to return to the ED for any worsening or new  symptoms.     ____________________________________________  FINAL CLINICAL IMPRESSION(S) / ED DIAGNOSES  Final diagnoses:  Venous stasis dermatitis of left lower extremity      NEW MEDICATIONS STARTED DURING THIS VISIT:  ED Discharge Orders    None          This chart was dictated using voice recognition  software/Dragon. Despite best efforts to proofread, errors can occur which can change the meaning. Any change was purely unintentional.    Brynda Peon 10/24/19 2145    Nance Pear, MD 10/24/19 2209

## 2019-10-24 NOTE — Telephone Encounter (Signed)
Spoke with stephanie, she reports the patient has redness from his heel up the back of his leg and they felt like it was cellulitis. They had advised the patient to be seen today. Colletta [place] made aware we do not treat cellulitis and he would need to see his medical doctor. She also reports significant weight gain from 174 lb last week to 187 today. He denies SOB. When reviewing patient chart it appears he is in the [facility] ER.

## 2019-10-26 ENCOUNTER — Encounter: Payer: Self-pay | Admitting: Cardiovascular Disease

## 2019-10-30 DIAGNOSIS — I5022 Chronic systolic (congestive) heart failure: Secondary | ICD-10-CM | POA: Diagnosis not present

## 2019-10-30 DIAGNOSIS — D649 Anemia, unspecified: Secondary | ICD-10-CM | POA: Diagnosis not present

## 2019-10-30 DIAGNOSIS — I10 Essential (primary) hypertension: Secondary | ICD-10-CM | POA: Diagnosis not present

## 2019-10-30 DIAGNOSIS — I878 Other specified disorders of veins: Secondary | ICD-10-CM | POA: Diagnosis not present

## 2019-10-31 ENCOUNTER — Telehealth: Payer: Self-pay | Admitting: Physician Assistant

## 2019-10-31 DIAGNOSIS — H918X3 Other specified hearing loss, bilateral: Secondary | ICD-10-CM | POA: Diagnosis not present

## 2019-10-31 DIAGNOSIS — I252 Old myocardial infarction: Secondary | ICD-10-CM | POA: Diagnosis not present

## 2019-10-31 DIAGNOSIS — E785 Hyperlipidemia, unspecified: Secondary | ICD-10-CM | POA: Diagnosis not present

## 2019-10-31 DIAGNOSIS — Z950 Presence of cardiac pacemaker: Secondary | ICD-10-CM | POA: Diagnosis not present

## 2019-10-31 DIAGNOSIS — Z8546 Personal history of malignant neoplasm of prostate: Secondary | ICD-10-CM | POA: Diagnosis not present

## 2019-10-31 DIAGNOSIS — M6259 Muscle wasting and atrophy, not elsewhere classified, multiple sites: Secondary | ICD-10-CM | POA: Diagnosis not present

## 2019-10-31 DIAGNOSIS — I959 Hypotension, unspecified: Secondary | ICD-10-CM | POA: Diagnosis not present

## 2019-10-31 DIAGNOSIS — I130 Hypertensive heart and chronic kidney disease with heart failure and stage 1 through stage 4 chronic kidney disease, or unspecified chronic kidney disease: Secondary | ICD-10-CM | POA: Diagnosis not present

## 2019-10-31 DIAGNOSIS — I255 Ischemic cardiomyopathy: Secondary | ICD-10-CM | POA: Diagnosis not present

## 2019-10-31 DIAGNOSIS — I714 Abdominal aortic aneurysm, without rupture: Secondary | ICD-10-CM | POA: Diagnosis not present

## 2019-10-31 DIAGNOSIS — Z951 Presence of aortocoronary bypass graft: Secondary | ICD-10-CM | POA: Diagnosis not present

## 2019-10-31 DIAGNOSIS — E8809 Other disorders of plasma-protein metabolism, not elsewhere classified: Secondary | ICD-10-CM | POA: Diagnosis not present

## 2019-10-31 DIAGNOSIS — I890 Lymphedema, not elsewhere classified: Secondary | ICD-10-CM | POA: Diagnosis not present

## 2019-10-31 DIAGNOSIS — N183 Chronic kidney disease, stage 3 unspecified: Secondary | ICD-10-CM | POA: Diagnosis not present

## 2019-10-31 DIAGNOSIS — I2510 Atherosclerotic heart disease of native coronary artery without angina pectoris: Secondary | ICD-10-CM | POA: Diagnosis not present

## 2019-10-31 DIAGNOSIS — L539 Erythematous condition, unspecified: Secondary | ICD-10-CM | POA: Diagnosis not present

## 2019-10-31 DIAGNOSIS — Z87891 Personal history of nicotine dependence: Secondary | ICD-10-CM | POA: Diagnosis not present

## 2019-10-31 DIAGNOSIS — L89151 Pressure ulcer of sacral region, stage 1: Secondary | ICD-10-CM | POA: Diagnosis not present

## 2019-10-31 DIAGNOSIS — Z9181 History of falling: Secondary | ICD-10-CM | POA: Diagnosis not present

## 2019-10-31 DIAGNOSIS — I480 Paroxysmal atrial fibrillation: Secondary | ICD-10-CM | POA: Diagnosis not present

## 2019-10-31 DIAGNOSIS — I5042 Chronic combined systolic (congestive) and diastolic (congestive) heart failure: Secondary | ICD-10-CM | POA: Diagnosis not present

## 2019-10-31 DIAGNOSIS — D631 Anemia in chronic kidney disease: Secondary | ICD-10-CM | POA: Diagnosis not present

## 2019-10-31 DIAGNOSIS — Z8673 Personal history of transient ischemic attack (TIA), and cerebral infarction without residual deficits: Secondary | ICD-10-CM | POA: Diagnosis not present

## 2019-10-31 NOTE — Telephone Encounter (Signed)
Called to discuss the homebound Covid-19 vaccination initiative with the patient and/or caregiver.   Message left to call back with an interpreter (pt is deaf)  Angelena Form PA-C  [facility]

## 2019-11-06 ENCOUNTER — Encounter: Payer: PPO | Admitting: Cardiovascular Disease

## 2019-11-08 ENCOUNTER — Telehealth: Payer: Self-pay

## 2019-11-08 NOTE — Telephone Encounter (Signed)
Patient referred to [facility] clinic by Dr Sallyanne Kuster.  Attempted call to patient and left message via interpreter services to return call with phone number.

## 2019-11-08 NOTE — Telephone Encounter (Signed)
-----   Message from Ricci Barker, RN sent at 10/23/2019  9:32 AM EDT ----- Morning!!! Dr. Sallyanne Kuster wants him added with you and 3 month downloads. Is there anything I need to do on this end??  Thank you, Lattie Haw

## 2019-11-09 DIAGNOSIS — M6259 Muscle wasting and atrophy, not elsewhere classified, multiple sites: Secondary | ICD-10-CM | POA: Diagnosis not present

## 2019-11-09 DIAGNOSIS — I252 Old myocardial infarction: Secondary | ICD-10-CM | POA: Diagnosis not present

## 2019-11-09 DIAGNOSIS — I959 Hypotension, unspecified: Secondary | ICD-10-CM | POA: Diagnosis not present

## 2019-11-09 DIAGNOSIS — Z950 Presence of cardiac pacemaker: Secondary | ICD-10-CM | POA: Diagnosis not present

## 2019-11-09 DIAGNOSIS — Z9181 History of falling: Secondary | ICD-10-CM | POA: Diagnosis not present

## 2019-11-09 DIAGNOSIS — I890 Lymphedema, not elsewhere classified: Secondary | ICD-10-CM | POA: Diagnosis not present

## 2019-11-09 DIAGNOSIS — Z8673 Personal history of transient ischemic attack (TIA), and cerebral infarction without residual deficits: Secondary | ICD-10-CM | POA: Diagnosis not present

## 2019-11-09 DIAGNOSIS — E8809 Other disorders of plasma-protein metabolism, not elsewhere classified: Secondary | ICD-10-CM | POA: Diagnosis not present

## 2019-11-09 DIAGNOSIS — I714 Abdominal aortic aneurysm, without rupture: Secondary | ICD-10-CM | POA: Diagnosis not present

## 2019-11-09 DIAGNOSIS — I130 Hypertensive heart and chronic kidney disease with heart failure and stage 1 through stage 4 chronic kidney disease, or unspecified chronic kidney disease: Secondary | ICD-10-CM | POA: Diagnosis not present

## 2019-11-09 DIAGNOSIS — D631 Anemia in chronic kidney disease: Secondary | ICD-10-CM | POA: Diagnosis not present

## 2019-11-09 DIAGNOSIS — N183 Chronic kidney disease, stage 3 unspecified: Secondary | ICD-10-CM | POA: Diagnosis not present

## 2019-11-09 DIAGNOSIS — L89151 Pressure ulcer of sacral region, stage 1: Secondary | ICD-10-CM | POA: Diagnosis not present

## 2019-11-09 DIAGNOSIS — Z951 Presence of aortocoronary bypass graft: Secondary | ICD-10-CM | POA: Diagnosis not present

## 2019-11-09 DIAGNOSIS — Z87891 Personal history of nicotine dependence: Secondary | ICD-10-CM | POA: Diagnosis not present

## 2019-11-09 DIAGNOSIS — H918X3 Other specified hearing loss, bilateral: Secondary | ICD-10-CM | POA: Diagnosis not present

## 2019-11-09 DIAGNOSIS — I5042 Chronic combined systolic (congestive) and diastolic (congestive) heart failure: Secondary | ICD-10-CM | POA: Diagnosis not present

## 2019-11-09 DIAGNOSIS — I480 Paroxysmal atrial fibrillation: Secondary | ICD-10-CM | POA: Diagnosis not present

## 2019-11-09 DIAGNOSIS — E785 Hyperlipidemia, unspecified: Secondary | ICD-10-CM | POA: Diagnosis not present

## 2019-11-09 DIAGNOSIS — L539 Erythematous condition, unspecified: Secondary | ICD-10-CM | POA: Diagnosis not present

## 2019-11-09 DIAGNOSIS — Z8546 Personal history of malignant neoplasm of prostate: Secondary | ICD-10-CM | POA: Diagnosis not present

## 2019-11-09 DIAGNOSIS — I2510 Atherosclerotic heart disease of native coronary artery without angina pectoris: Secondary | ICD-10-CM | POA: Diagnosis not present

## 2019-11-09 DIAGNOSIS — I255 Ischemic cardiomyopathy: Secondary | ICD-10-CM | POA: Diagnosis not present

## 2019-11-11 NOTE — Progress Notes (Deleted)
[place]  [phone] [phone]) [phone]  ID: Thomas [name] OB: [birth year]  MR#: [id number]  JFH#:[id number]  Patient Care Team: [name], MD as PCP - General (Family Medicine) Sanda Klein, MD as PCP - Cardiology (Cardiology)  CHIEF COMPLAINT: Iron deficiency anemia.  INTERVAL HISTORY: ***  REVIEW OF SYSTEMS:   ROS  As per HPI. Otherwise, a complete review of systems is negative.  PAST MEDICAL HISTORY: Past Medical History:  Diagnosis Date   AICD (automatic cardioverter/defibrillator) present    Bacteremia due to group B Streptococcus    Recurrent admissions for group B Strepotococcus bacteremia of unknown source with TEEs negative for vegetation 06/2019 and 08/2019   Biventricular ICD (implantable cardioverter-defibrillator) in place 03/24/2005   Implantation of a Med[employer]onic Adapta ADDRO1, serial number [id number]   CHF (congestive heart failure) (HCC)    CKD (chronic kidney disease), stage III    Coronary artery disease    a. s/p CABG 1986. b. Multiple PCIs/caths. c. 09/2013: s/p PTCA and BMS to SVG-OM.   Deaf    Requires sign language interpreter   Dysrhythmia    History of abdominal aortic aneurysm    History of bleeding peptic ulcer 1980   History of epididymitis 2013   HTN (hypertension)    Hydronephrosis with ureteropelvic junction obstruction    Hydroureter on left 2009   Hypertension    Ischemic cardiomyopathy    a. Prior EF 30-35%, s/p BIV-ICD. b. 09/2013: EF 45-50%.   Moderate tricuspid regurgitation    PAF (paroxysmal atrial fibrillation) (HCC)    Not on [place] 2/2 GIB   Presence of permanent cardiac pacemaker 2002   Original placed in 2002 for CHB then 2007 and 2014 device change out   Prostate cancer [facility])    Status post coronary artery bypass grafting 1986   LIMA to the LAD, SVG to OM, SVG to RCA   Testicular swelling     PAST SURGICAL HISTORY: Past Surgical History:  Procedure  Laterality Date   2-D echocardiogram  11/20/2011   Ejection fraction 30-35% moderate concentric left ventricular hypertrophy. Left atrium is moderately dilated. Mild MR. Mild or   BI-VENTRICULAR IMPLANTABLE CARDIOVERTER DEFIBRILLATOR N/A 12/16/2012   Procedure: BI-VENTRICULAR IMPLANTABLE CARDIOVERTER DEFIBRILLATOR  (CRT-D);  Surgeon: [name], MD;  Location: [facility] CATH LAB;  Service: Cardiovascular;  Laterality: N/A;   BIV ICD INSERTION CRT-D N/A 09/01/2019   Procedure: BIV ICD INSERTION CRT-D;  Surgeon: [name], MD;  Location: [place] CV LAB;  Service: Cardiovascular;  Laterality: N/A;   CARDIAC CATHETERIZATION  12/10/2011   SVG to OM widely patent.  LIMA to LAD patent   CATARACT EXTRACTION W/PHACO Right 10/12/2017   Procedure: CATARACT EXTRACTION PHACO AND INTRAOCULAR LENS PLACEMENT (IOC);  Surgeon: Birder Robson, MD;  Location: [facility] ORS;  Service: Ophthalmology;  Laterality: Right;  [place] 00:57 AP% 15.9 CDE 9.07 Fluid pack lot # [id number]   COLONOSCOPY N/A 07/13/2018   Procedure: COLONOSCOPY;  Surgeon: [place], Benay Pike, MD;  Location: [facility] ENDOSCOPY;  Service: Gastroenterology;  Laterality: N/A;   CORONARY ARTERY BYPASS GRAFT  1986   ENTEROSCOPY N/A 09/14/2018   Procedure: ENTEROSCOPY;  Surgeon: [place], Benay Pike, MD;  Location: [facility] ENDOSCOPY;  Service: Gastroenterology;  Laterality: N/A;  symptomatic anemia, GI blood loss anemia, melena, positive small bowel capsule endoscopy showing source of bleeding    ENTEROSCOPY N/A 08/22/2019   Procedure: ENTEROSCOPY;  Surgeon: Lin Landsman, MD;  Location: [place];  Service: Gastroenterology;  Laterality: N/A;  ESOPHAGOGASTRODUODENOSCOPY N/A 07/13/2018   Procedure: ESOPHAGOGASTRODUODENOSCOPY (EGD);  Surgeon: [place], Benay Pike, MD;  Location: [facility] ENDOSCOPY;  Service: Gastroenterology;  Laterality: N/A;   ESOPHAGOGASTRODUODENOSCOPY N/A 09/14/2018   Procedure: ESOPHAGOGASTRODUODENOSCOPY (EGD);  Surgeon: [place], Benay Pike, MD;  Location: [facility] ENDOSCOPY;  Service: Gastroenterology;  Laterality: N/A;  SIGN LANAGUAGE INTERPRETER   ESOPHAGOGASTRODUODENOSCOPY N/A 06/21/2019   Procedure: ESOPHAGOGASTRODUODENOSCOPY (EGD);  Surgeon: [place], Benay Pike, MD;  Location: [facility] ENDOSCOPY;  Service: Gastroenterology;  Laterality: N/A;   ESOPHAGOGASTRODUODENOSCOPY (EGD) WITH PROPOFOL N/A 05/27/2018   Procedure: ESOPHAGOGASTRODUODENOSCOPY (EGD) WITH PROPOFOL;  Surgeon: Clarene Essex, MD;  Location: [place];  Service: Endoscopy;  Laterality: N/A;   ICD LEAD REMOVAL Left 08/25/2019   Procedure: ICD LEAD EXTRACTION;  Surgeon: [name], MD;  Location: [facility] OR;  Service: Cardiovascular;  Laterality: Left;  DR. Roxan Hockey BACK UP   INSERT / REPLACE / REMOVE PACEMAKER     LEFT HEART CATHETERIZATION WITH CORONARY/GRAFT ANGIOGRAM N/A 12/10/2011   Procedure: LEFT HEART CATHETERIZATION WITH Beatrix Fetters;  Surgeon: Sanda Klein, MD;  Location: [place] CATH LAB;  Service: Cardiovascular;  Laterality: N/A;   LEFT HEART CATHETERIZATION WITH CORONARY/GRAFT ANGIOGRAM N/A 09/25/2013   Procedure: LEFT HEART CATHETERIZATION WITH Beatrix Fetters;  Surgeon: Blane Ohara, MD;  Location: [facility] CATH LAB;  Service: Cardiovascular;  Laterality: N/A;   MULTIPLE EXTRACTIONS WITH ALVEOLOPLASTY Bilateral 08/30/2019   Procedure: Extractionof tooth #'s 2, 28, and 31 with alveoloplasty and gross debridement of remaining teeth.;  Surgeon: [name], DDS;  Location: [place];  Service: Oral Surgery;  Laterality: Bilateral;   Persantine Myoview  05/06/2010   Post-rest ejection fraction 30%. No significant ischemia demonstrated. Compared to previous study there is no significant change.   TEE WITHOUT CARDIOVERSION N/A 06/22/2019   Procedure: TRANSESOPHAGEAL ECHOCARDIOGRAM (TEE);  Surgeon: Minna Merritts, MD;  Location: [facility] ORS;  Service: Cardiovascular;  Laterality: N/A;   TEE WITHOUT CARDIOVERSION N/A 08/23/2019   Procedure:  TRANSESOPHAGEAL ECHOCARDIOGRAM (TEE);  Surgeon: Kate Sable, MD;  Location: [facility] ORS;  Service: Cardiovascular;  Laterality: N/A;   TEE WITHOUT CARDIOVERSION N/A 08/25/2019   Procedure: TRANSESOPHAGEAL ECHOCARDIOGRAM (TEE);  Surgeon: [name], MD;  Location: [facility] OR;  Service: Cardiovascular;  Laterality: N/A;   TRANSURETHRAL RESECTION OF PROSTATE     s/p    FAMILY HISTORY: Family History  Problem Relation Age of Onset   Hypertension Father     ADVANCED DIRECTIVES (Y/N):  N  HEALTH MAINTENANCE: Social History   Tobacco Use   Smoking status: Former Smoker    Quit date: 03/15/1985    Years since quitting: 34.6   Smokeless tobacco: Never Used  Vaping Use   Vaping Use: Never used  Substance Use Topics   Alcohol use: No    Comment: occas.   Drug use: No     Colonoscopy:  PAP:  Bone density:  Lipid panel:  Allergies  Allergen Reactions   Entresto [Sacubitril-Valsartan] Swelling    And bruising of arm   Phenazopyridine Nausea Only and Other (See Comments)    GI UPSET   Ramipril Other (See Comments)    unk Other reaction(s): Other (See Comments), Unknown unk    Current Outpatient Medications  Medication Sig Dispense Refill   acetaminophen (TYLENOL) 325 MG tablet Take 2 tablets (650 mg total) by mouth every 6 (six) hours as needed for mild pain or fever.     albuterol (PROVENTIL) (2.5 MG/3ML) 0.083% nebulizer solution Inhale 3 mLs into the lungs every 4 (four) hours as  needed for wheezing or shortness of breath. 75 mL 12   ferrous sulfate 325 (65 FE) MG tablet Take 1 tablet (325 mg total) by mouth 2 (two) times daily with a meal. 60 tablet 0   midodrine (PROAMATINE) 5 MG tablet Take 1 tablet (5 mg total) by mouth 3 (three) times daily with meals. 90 tablet 1   Multiple Vitamin (MULTIVITAMIN WITH MINERALS) TABS tablet Take 1 tablet by mouth daily.     Nystatin (GERHARDT'S BUTT CREAM) CREA Apply 1 application topically 2 (two) times daily.      ondansetron (ZOFRAN) 4 MG tablet Take 1 tablet (4 mg total) by mouth every 8 (eight) hours as needed for nausea or vomiting. 30 tablet 1   pantoprazole (PROTONIX) 40 MG tablet Take 1 tablet (40 mg total) by mouth 2 (two) times daily for 14 days, THEN 1 tablet (40 mg total) daily. 104 tablet 0   simvastatin (ZOCOR) 20 MG tablet TAKE 1 TABLET BY MOUTH EVERY DAY (Patient taking differently: Take 20 mg by mouth daily. ) 90 tablet 2   spironolactone (ALDACTONE) 25 MG tablet Take 1 tablet (25 mg total) by mouth daily. 30 tablet 3   torsemide (DEMADEX) 20 MG tablet Take 2 tablets (40 mg total) by mouth 2 (two) times daily. 240 tablet 3   No current facility-administered medications for this visit.    OBJECTIVE: There were no vitals filed for this visit.   There is no height or weight on file to calculate BMI.    ECOG FS:{CHL ONC [id number]  General: Well-developed, well-nourished, no acute distress. Eyes: Pink conjunctiva, anicteric sclera. HEENT: Normocephalic, moist mucous membranes. Lungs: No audible wheezing or coughing. Heart: Regular rate and rhythm. Abdomen: Soft, nontender, no obvious distention. Musculoskeletal: No edema, cyanosis, or clubbing. Neuro: Alert, answering all questions appropriately. Cranial nerves grossly intact. Skin: No rashes or petechiae noted. Psych: Normal affect. Lymphatics: No cervical, calvicular, axillary or inguinal LAD.   LAB RESULTS:  Lab Results  Component Value Date   NA 142 10/24/2019   K 3.9 10/24/2019   CL 103 10/24/2019   CO2 30 10/24/2019   GLUCOSE 106 (H) 10/24/2019   BUN 36 (H) 10/24/2019   CREATININE 1.35 (H) 10/24/2019   CALCIUM 8.7 (L) 10/24/2019   PROT 6.0 (L) 10/24/2019   ALBUMIN 2.9 (L) 10/24/2019   AST 30 10/24/2019   ALT 15 10/24/2019   ALKPHOS 119 10/24/2019   BILITOT 0.7 10/24/2019   GFRNONAA 49 (L) 10/24/2019   GFRAA 56 (L) 10/24/2019    Lab Results  Component Value Date   WBC 5.8 10/24/2019   NEUTROABS 4.1  10/24/2019   HGB 9.2 (L) 10/24/2019   HCT 29.2 (L) 10/24/2019   MCV 84.6 10/24/2019   PLT 263 10/24/2019     STUDIES: US Venous Img Lower Unilateral Left  Result Date: 10/24/2019 CLINICAL DATA:  Left leg redness swelling and pain. EXAM: LEFT LOWER EXTREMITY VENOUS DOPPLER ULTRASOUND TECHNIQUE: Gray-scale sonography with compression, as well as color and duplex ultrasound, were performed to evaluate the deep venous system(s) from the level of the common femoral vein through the popliteal and proximal calf veins. COMPARISON:  None. FINDINGS: VENOUS Normal compressibility of the common femoral, superficial femoral, and popliteal veins, as well as the visualized calf veins. Visualized portions of profunda femoral vein and great saphenous vein unremarkable. No filling defects to suggest DVT on grayscale or color Doppler imaging. Doppler waveforms show normal direction of venous flow, normal respiratory plasticity and response to augmentation.  Limited views of the contralateral common femoral vein are unremarkable. OTHER None. Limitations: Posterior tibial vein not well visualized. Peroneal vein not visualized IMPRESSION: Negative. Electronically Signed   By: Lajean Manes M.D.   On: 10/24/2019 18:35    ASSESSMENT: Iron deficiency anemia.   PLAN:    1. Iron deficiency anemia:  Patient expressed understanding and was in agreement with this plan. He also understands that He can call clinic at any time with any questions, concerns, or complaints.   Cancer Staging No matching staging information was found for the patient.  Lloyd Huger, MD   11/11/2019 10:37 AM

## 2019-11-16 ENCOUNTER — Inpatient Hospital Stay: Payer: PPO

## 2019-11-16 ENCOUNTER — Inpatient Hospital Stay: Payer: PPO | Admitting: Oncology

## 2019-11-21 NOTE — Telephone Encounter (Signed)
Attempted call to patient and left message via interpreter services to return call with phone number.

## 2019-11-22 NOTE — Telephone Encounter (Signed)
Wife returned call.  She will check home monitor to make sure it is plugged in.  Advised if monitor is connected it will arrive between midnight and 6 AM on 08/29/2019.  If the report is not received, will call to assist with sending a manual report.

## 2019-11-22 NOTE — Telephone Encounter (Signed)
ICM call to patient and spoke with wife using interpreter services.  Provided ICM intro and she agreed to monthly follow up for patient.  Advised currently his monitor is showing disconnected and advised to check to make sure the monitor is plugged in.  She thought it was connected but would check it. Advised remote transmission is scheduled for 11/29/2019 to review fluid levels.  Offered assistance to help send a remote to connect but call was suddenly lost connection.  Attempted call back but no answer.

## 2019-11-29 ENCOUNTER — Ambulatory Visit (INDEPENDENT_AMBULATORY_CARE_PROVIDER_SITE_OTHER): Payer: PPO

## 2019-11-29 ENCOUNTER — Telehealth: Payer: Self-pay

## 2019-11-29 DIAGNOSIS — I50812 Chronic right heart failure: Secondary | ICD-10-CM | POA: Diagnosis not present

## 2019-11-29 DIAGNOSIS — Z95810 Presence of automatic (implantable) cardiac defibrillator: Secondary | ICD-10-CM | POA: Diagnosis not present

## 2019-11-29 NOTE — Progress Notes (Signed)
Thank you (she asked the same questions about the same foods in the office...)

## 2019-11-29 NOTE — Telephone Encounter (Signed)
Remote ICM transmission received.  Attempted call to wife using interpreter services regarding ICM remote transmission and left message to return call.

## 2019-11-29 NOTE — Progress Notes (Signed)
EPIC Encounter for ICM Monitoring  Patient Name: Thomas [name] is a 85 y.o. male Date: 11/29/2019 Primary Care Physican: [name], MD Primary Cardiologist: [place] Electrophysiologist: Santina Evans Pacing: 88.9%  10/23/2019 Office Weight: 188 lbs        1st ICM remote transmission. Attempted call to wife and left message for return call.  Transmission reviewed.    Optivol thoracic impedance suggesting possible fluid accumulation for past 2 days but trending toward baseline.   Prescribed:   Torsemide 20 mg take 2 tablets (40 mg total) twice a day.  Spironolactone 25 mg take 1 tablet daily  Labs: 10/24/2019 Creatinine 1.35, BUN 36, Potassium 3.9, Sodium 142, GFR 49-56 09/12/2019 Creatinine 1.00, BUN 16, Potassium 4.0, Sodium 143, GFR >60  09/11/2019 Creatinine 0.96, BUN 15, Potassium 3.7, Sodium 141, GFR >60  09/10/2019 Creatinine 0.98, BUN 13, Potassium 3.6, Sodium 140, GFR >60  09/09/2019 Creatinine 0.92, BUN 13, Potassium 3.3, Sodium 139, GFR >60  09/08/2019 Creatinine 1.02, BUN 13, Potassium 3.6, Sodium 137, GFR >60  A complete set of results can be found in Results Review.  Recommendations: Unable to reach.    Follow-up plan: ICM clinic phone appointment on 12/18/2019 to recheck fluid levels.   91 day device clinic remote transmission 12/12/2019.    EP/Cardiology Office Visits: 12/04/2019 with Dr. [name].    Copy of ICM check sent to Dr. [name] and Dr Sallyanne Kuster.   3 month ICM trend: 11/29/2019    1 Year ICM trend:       Rosalene Billings, RN 11/29/2019 3:05 PM

## 2019-11-29 NOTE — Progress Notes (Signed)
Spoke with wife.  She reports patient if feeling fine.  Current weight is 176 lbs.  Discussed food choices and limiting daily salt intake.  She said the physician said to limit salt intake to 1500 mg daily and to avoid canned foods.  She asked if patient can have foods such as deli meats, cheese and packages of meats and cheeses presliced.  Advised these types of foods are high in salt and better choice would be to bake fresh chicken or [place] for sandwiches. Encouraged to call if experiencing any fluid symptoms.

## 2019-12-03 NOTE — Progress Notes (Signed)
[place]  [phone] Fax:(336) [phone]  ID: Thomas [name] OB: 03-Apr-1934  MR#: [id number]  LKJ#:[id number]  Patient Care Team: [name], MD as PCP - General (Family Medicine) Sanda Klein, MD as PCP - Cardiology (Cardiology)  CHIEF COMPLAINT: Iron deficiency anemia.   INTERVAL HISTORY: Patient is an 85 year old male who was recently admitted to the hospital with upper GI bleed requiring approximately 4 units of packed red blood cells.  Subsequent work-up revealed AVMs that were subsequently treated with argon laser.  He continues to have chronic weakness and fatigue, but otherwise feels well.  He has no neurologic complaints.  He denies any recent fevers or illnesses.  He has a good appetite and denies weight loss.  He has no chest pain, shortness of breath, cough, or hemoptysis.  He denies any nausea, vomiting, constipation, or diarrhea.  He denies any further melena or hematochezia.  He has no urinary complaints.  Patient offers no further specific complaints today.  REVIEW OF SYSTEMS:   Review of Systems  Constitutional: Positive for malaise/fatigue. Negative for fever and weight loss.  Respiratory: Negative.  Negative for cough, hemoptysis and shortness of breath.   Cardiovascular: Negative.  Negative for chest pain and leg swelling.  Gastrointestinal: Negative.  Negative for abdominal pain, blood in stool and melena.  Genitourinary: Negative.  Negative for frequency.  Musculoskeletal: Negative.  Negative for back pain.  Skin: Negative.  Negative for rash.  Neurological: Positive for weakness. Negative for dizziness, focal weakness and headaches.  Psychiatric/Behavioral: Negative.  The patient is not nervous/anxious.     As per HPI. Otherwise, a complete review of systems is negative.  PAST MEDICAL HISTORY: Past Medical History:  Diagnosis Date  . AICD (automatic cardioverter/defibrillator) present   . Bacteremia due to group B  Streptococcus    Recurrent admissions for group B Strepotococcus bacteremia of unknown source with TEEs negative for vegetation 06/2019 and 08/2019  . Biventricular ICD (implantable cardioverter-defibrillator) in place 03/24/2005   Implantation of a Medtronic Adapta ADDRO1, serial number [id number]  . CHF (congestive heart failure) ([place])   . CKD (chronic kidney disease), stage III   . Coronary artery disease    a. s/p CABG 1986. b. Multiple PCIs/caths. c. 09/2013: s/p PTCA and BMS to SVG-OM.  [name]    Requires sign language interpreter  . Dysrhythmia   . History of abdominal aortic aneurysm   . History of bleeding peptic ulcer 1980  . History of epididymitis 2013  . HTN (hypertension)   . Hydronephrosis with ureteropelvic junction obstruction   . Hydroureter on left 2009  . Hypertension   . Ischemic cardiomyopathy    a. Prior EF 30-35%, s/p BIV-ICD. b. 09/2013: EF 45-50%.  . Moderate tricuspid regurgitation   . PAF (paroxysmal atrial fibrillation) (HCC)    Not on [place] 2/2 GIB  . Presence of permanent cardiac pacemaker 2002   Original placed in 2002 for CHB then 2007 and 2014 device change out  . Prostate cancer ([place])   . Status post coronary artery bypass grafting 1986   LIMA to the LAD, SVG to OM, SVG to RCA  . Testicular swelling     PAST SURGICAL HISTORY: Past Surgical History:  Procedure Laterality Date  . 2-D echocardiogram  11/20/2011   Ejection fraction 30-35% moderate concentric left ventricular hypertrophy. Left atrium is moderately dilated. Mild MR. Mild or  . BI-VENTRICULAR IMPLANTABLE CARDIOVERTER DEFIBRILLATOR N/A 12/16/2012   Procedure: BI-VENTRICULAR IMPLANTABLE CARDIOVERTER DEFIBRILLATOR  (CRT-D);  Surgeon:  [name], MD;  Location: [facility] CATH LAB;  Service: Cardiovascular;  Laterality: N/A;  . BIV ICD INSERTION CRT-D N/A 09/01/2019   Procedure: BIV ICD INSERTION CRT-D;  Surgeon: [name], MD;  Location: [place] CV LAB;  Service: Cardiovascular;   Laterality: N/A;  . CARDIAC CATHETERIZATION  12/10/2011   SVG to OM widely patent.  LIMA to LAD patent  . CATARACT EXTRACTION W/PHACO Right 10/12/2017   Procedure: CATARACT EXTRACTION PHACO AND INTRAOCULAR LENS PLACEMENT (IOC);  Surgeon: Birder Robson, MD;  Location: [facility] ORS;  Service: Ophthalmology;  Laterality: Right;  [place] 00:57 AP% 15.9 CDE 9.07 Fluid pack lot # [id number]  . COLONOSCOPY N/A 07/13/2018   Procedure: COLONOSCOPY;  Surgeon: Toledo, Benay Pike, MD;  Location: [facility] ENDOSCOPY;  Service: Gastroenterology;  Laterality: N/A;  . CORONARY ARTERY BYPASS GRAFT  1986  . ENTEROSCOPY N/A 09/14/2018   Procedure: ENTEROSCOPY;  Surgeon: Toledo, Benay Pike, MD;  Location: [facility] ENDOSCOPY;  Service: Gastroenterology;  Laterality: N/A;  symptomatic anemia, GI blood loss anemia, melena, positive small bowel capsule endoscopy showing source of bleeding   . ENTEROSCOPY N/A 08/22/2019   Procedure: ENTEROSCOPY;  Surgeon: Lin Landsman, MD;  [facility] ENDOSCOPY;  Service: Gastroenterology;  Laterality: N/A;  . ESOPHAGOGASTRODUODENOSCOPY N/A 07/13/2018   Procedure: ESOPHAGOGASTRODUODENOSCOPY (EGD);  Surgeon: Toledo, Benay Pike, MD;  Location: [facility] ENDOSCOPY;  Service: Gastroenterology;  Laterality: N/A;  . ESOPHAGOGASTRODUODENOSCOPY N/A 09/14/2018   Procedure: ESOPHAGOGASTRODUODENOSCOPY (EGD);  Surgeon: Toledo, Benay Pike, MD;  Location: [facility] ENDOSCOPY;  Service: Gastroenterology;  Laterality: N/A;  SIGN LANAGUAGE INTERPRETER  . ESOPHAGOGASTRODUODENOSCOPY N/A 06/21/2019   Procedure: ESOPHAGOGASTRODUODENOSCOPY (EGD);  Surgeon: Toledo, Benay Pike, MD;  Location: [facility] ENDOSCOPY;  Service: Gastroenterology;  Laterality: N/A;  . ESOPHAGOGASTRODUODENOSCOPY (EGD) WITH PROPOFOL N/A 05/27/2018   Procedure: ESOPHAGOGASTRODUODENOSCOPY (EGD) WITH PROPOFOL;  Surgeon: Clarene Essex, MD;  Location: [place];  Service: Endoscopy;  Laterality: N/A;  . ICD LEAD REMOVAL Left 08/25/2019   Procedure: ICD LEAD EXTRACTION;   Surgeon: [name], MD;  Location: [place];  Service: Cardiovascular;  Laterality: Left;  DR. Roxan Hockey BACK UP  . INSERT / REPLACE / REMOVE PACEMAKER    . LEFT HEART CATHETERIZATION WITH CORONARY/GRAFT ANGIOGRAM N/A 12/10/2011   Procedure: LEFT HEART CATHETERIZATION WITH Beatrix Fetters;  Surgeon: Sanda Klein, MD;  Location: [place] CATH LAB;  Service: Cardiovascular;  Laterality: N/A;  . LEFT HEART CATHETERIZATION WITH CORONARY/GRAFT ANGIOGRAM N/A 09/25/2013   Procedure: LEFT HEART CATHETERIZATION WITH Beatrix Fetters;  Surgeon: Blane Ohara, MD;  Location: [facility] CATH LAB;  Service: Cardiovascular;  Laterality: N/A;  . MULTIPLE EXTRACTIONS WITH ALVEOLOPLASTY Bilateral 08/30/2019   Procedure: Extractionof tooth #'s 2, 28, and 31 with alveoloplasty and gross debridement of remaining teeth.;  Surgeon: [name], DDS;  Location: [place];  Service: Oral Surgery;  Laterality: Bilateral;  . Persantine Myoview  05/06/2010   Post-rest ejection fraction 30%. No significant ischemia demonstrated. Compared to previous study there is no significant change.  . TEE WITHOUT CARDIOVERSION N/A 06/22/2019   Procedure: TRANSESOPHAGEAL ECHOCARDIOGRAM (TEE);  Surgeon: Minna Merritts, MD;  Location: [facility] ORS;  Service: Cardiovascular;  Laterality: N/A;  . TEE WITHOUT CARDIOVERSION N/A 08/23/2019   Procedure: TRANSESOPHAGEAL ECHOCARDIOGRAM (TEE);  Surgeon: Kate Sable, MD;  Location: [facility] ORS;  Service: Cardiovascular;  Laterality: N/A;  . TEE WITHOUT CARDIOVERSION N/A 08/25/2019   Procedure: TRANSESOPHAGEAL ECHOCARDIOGRAM (TEE);  Surgeon: [name], MD;  Location: [facility] OR;  Service: Cardiovascular;  Laterality: N/A;  . TRANSURETHRAL RESECTION OF  PROSTATE     s/p    FAMILY HISTORY: Family History  Problem Relation Age of Onset  . Hypertension Father     ADVANCED DIRECTIVES (Y/N):  N  HEALTH MAINTENANCE: Social History   Tobacco Use  . Smoking status: Former Smoker     Quit date: 03/15/1985    Years since quitting: 34.7  . Smokeless tobacco: Never Used  Vaping Use  . Vaping Use: Never used  Substance Use Topics  . Alcohol use: No    Comment: occas.  . Drug use: No     Colonoscopy:  PAP:  Bone density:  Lipid panel:  Allergies  Allergen Reactions  . Entresto [Sacubitril-Valsartan] Swelling    And bruising of arm  . Phenazopyridine Nausea Only and Other (See Comments)    GI UPSET  . Ramipril Other (See Comments)    unk Other reaction(s): Other (See Comments), Unknown unk    Current Outpatient Medications  Medication Sig Dispense Refill  . acetaminophen (TYLENOL) 325 MG tablet Take 2 tablets (650 mg total) by mouth every 6 (six) hours as needed for mild pain or fever.    [name] albuterol (PROVENTIL) (2.5 MG/3ML) 0.083% nebulizer solution Inhale 3 mLs into the lungs every 4 (four) hours as needed for wheezing or shortness of breath. 75 mL 12  . ferrous sulfate 325 (65 FE) MG tablet Take 1 tablet (325 mg total) by mouth 2 (two) times daily with a meal. (Patient taking differently: Take 325 mg by mouth daily with breakfast. ) 60 tablet 0  . losartan (COZAAR) 25 MG tablet Take 25 mg by mouth daily.    . midodrine (PROAMATINE) 5 MG tablet Take 1 tablet (5 mg total) by mouth 3 (three) times daily with meals. 90 tablet 1  . Multiple Vitamin (MULTIVITAMIN WITH MINERALS) TABS tablet Take 1 tablet by mouth daily.    [name] Nystatin (GERHARDT'S BUTT CREAM) CREA Apply 1 application topically 2 (two) times daily. (Patient taking differently: Apply 1 application topically as needed. )    . ondansetron (ZOFRAN) 4 MG tablet Take 1 tablet (4 mg total) by mouth every 8 (eight) hours as needed for nausea or vomiting. 30 tablet 1  . simvastatin (ZOCOR) 20 MG tablet TAKE 1 TABLET BY MOUTH EVERY DAY 90 tablet 2  . spironolactone (ALDACTONE) 25 MG tablet Take 1 tablet (25 mg total) by mouth daily. 30 tablet 3  . torsemide (DEMADEX) 20 MG tablet Take 2 tablets (40 mg total) by  mouth 2 (two) times daily. 240 tablet 3  . pantoprazole (PROTONIX) 40 MG tablet Take 1 tablet (40 mg total) by mouth 2 (two) times daily for 14 days, THEN 1 tablet (40 mg total) daily. (Patient not taking: Reported on 12/05/2019) 104 tablet 0   No current facility-administered medications for this visit.    OBJECTIVE: Vitals:   12/05/19 1125 12/05/19 1132  BP: 106/65   Pulse: 70   Resp: 20   Temp: 98.4 F (36.9 C) 98.4 F (36.9 C)  SpO2: 100%      Body mass index is 23.51 kg/m.    ECOG FS:1 - Symptomatic but completely ambulatory  General: Well-developed, well-nourished, no acute distress. Eyes: Pink conjunctiva, anicteric sclera. HEENT: Normocephalic, moist mucous membranes. Lungs: No audible wheezing or coughing. Heart: Regular rate and rhythm. Abdomen: Soft, nontender, no obvious distention. Musculoskeletal: No edema, cyanosis, or clubbing. Neuro: Alert, answering all questions appropriately. Cranial nerves grossly intact. Skin: No rashes or petechiae noted. Psych: Normal affect. Lymphatics: No cervical,  calvicular, axillary or inguinal LAD.   LAB RESULTS:  Lab Results  Component Value Date   NA 142 10/24/2019   K 3.9 10/24/2019   CL 103 10/24/2019   CO2 30 10/24/2019   GLUCOSE 106 (H) 10/24/2019   BUN 36 (H) 10/24/2019   CREATININE 1.35 (H) 10/24/2019   CALCIUM 8.7 (L) 10/24/2019   PROT 6.0 (L) 10/24/2019   ALBUMIN 2.9 (L) 10/24/2019   AST 30 10/24/2019   ALT 15 10/24/2019   ALKPHOS 119 10/24/2019   BILITOT 0.7 10/24/2019   GFRNONAA 49 (L) 10/24/2019   GFRAA 56 (L) 10/24/2019    Lab Results  Component Value Date   WBC 7.9 12/05/2019   NEUTROABS 4.1 10/24/2019   HGB 8.9 (L) 12/05/2019   HCT 28.7 (L) 12/05/2019   MCV 80.2 12/05/2019   PLT 320 12/05/2019   Lab Results  Component Value Date   IRON 52 12/05/2019   TIBC 265 12/05/2019   IRONPCTSAT 20 12/05/2019   Lab Results  Component Value Date   FERRITIN 46 12/05/2019     STUDIES: No results  found.  ASSESSMENT: Iron deficiency anemia.   PLAN:    1. Iron deficiency anemia: Secondary to GI bleed.  Patient underwent endoscopy in June 2021 revealed multiple AVMs that were treated with argon laser.  He received 4 units of packed red blood cells at that time.  His hemoglobin significantly improved.  Today's result is 8.9 which is essentially unchanged from previous.  Though his iron stores are within normal limits, this may be artificially elevated secondary to recent blood transfusions.  The remainder of her laboratory work is either negative or within normal limits.  Patient will return to clinic in 1, 2, and 3 weeks to receive 200 mg IV Venofer.  Patient will then return to clinic in 2 months with repeat laboratory work, further evaluation, and continuation of treatment if needed.  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  Patient expressed understanding and was in agreement with this plan. He also understands that He can call clinic at any time with any questions, concerns, or complaints.    Lloyd Huger, MD   12/06/2019 2:48 PM

## 2019-12-04 ENCOUNTER — Other Ambulatory Visit: Payer: Self-pay

## 2019-12-04 ENCOUNTER — Ambulatory Visit (INDEPENDENT_AMBULATORY_CARE_PROVIDER_SITE_OTHER): Payer: PPO | Admitting: Internal Medicine

## 2019-12-04 ENCOUNTER — Encounter: Payer: Self-pay | Admitting: Internal Medicine

## 2019-12-04 VITALS — BP 112/60 | HR 93 | Ht 74.0 in | Wt 184.8 lb

## 2019-12-04 DIAGNOSIS — Z95810 Presence of automatic (implantable) cardiac defibrillator: Secondary | ICD-10-CM | POA: Diagnosis not present

## 2019-12-04 DIAGNOSIS — I10 Essential (primary) hypertension: Secondary | ICD-10-CM | POA: Diagnosis not present

## 2019-12-04 DIAGNOSIS — B951 Streptococcus, group B, as the cause of diseases classified elsewhere: Secondary | ICD-10-CM | POA: Diagnosis not present

## 2019-12-04 DIAGNOSIS — I5022 Chronic systolic (congestive) heart failure: Secondary | ICD-10-CM | POA: Diagnosis not present

## 2019-12-04 DIAGNOSIS — R7881 Bacteremia: Secondary | ICD-10-CM | POA: Diagnosis not present

## 2019-12-04 NOTE — Progress Notes (Signed)
HPI Thomas [name] returns today for followup. He is a pleasant 85 yo man with CHB, chronic atrial fib, ICD system infection s/p extraction, and insertion of a new biv ICD. He has done well in the interim. He is trying to avoid salty foods. He has not had any ICD therapies. He has known severe TR.  Allergies  Allergen Reactions  . Entresto [Sacubitril-Valsartan] Swelling    And bruising of arm  . Phenazopyridine Nausea Only and Other (See Comments)    GI UPSET  . Ramipril Other (See Comments)    unk Other reaction(s): Other (See Comments), Unknown unk     Current Outpatient Medications  Medication Sig Dispense Refill  . acetaminophen (TYLENOL) 325 MG tablet Take 2 tablets (650 mg total) by mouth every 6 (six) hours as needed for mild pain or fever.    [name] albuterol (PROVENTIL) (2.5 MG/3ML) 0.083% nebulizer solution Inhale 3 mLs into the lungs every 4 (four) hours as needed for wheezing or shortness of breath. 75 mL 12  . midodrine (PROAMATINE) 5 MG tablet Take 1 tablet (5 mg total) by mouth 3 (three) times daily with meals. 90 tablet 1  . Multiple Vitamin (MULTIVITAMIN WITH MINERALS) TABS tablet Take 1 tablet by mouth daily.    [name] Nystatin (GERHARDT'S BUTT CREAM) CREA Apply 1 application topically 2 (two) times daily.    . ondansetron (ZOFRAN) 4 MG tablet Take 1 tablet (4 mg total) by mouth every 8 (eight) hours as needed for nausea or vomiting. 30 tablet 1  . pantoprazole (PROTONIX) 40 MG tablet Take 1 tablet (40 mg total) by mouth 2 (two) times daily for 14 days, THEN 1 tablet (40 mg total) daily. 104 tablet 0  . simvastatin (ZOCOR) 20 MG tablet TAKE 1 TABLET BY MOUTH EVERY DAY 90 tablet 2  . torsemide (DEMADEX) 20 MG tablet Take 2 tablets (40 mg total) by mouth 2 (two) times daily. 240 tablet 3  . ferrous sulfate 325 (65 FE) MG tablet Take 1 tablet (325 mg total) by mouth 2 (two) times daily with a meal. 60 tablet 0  . spironolactone (ALDACTONE) 25 MG tablet Take 1 tablet (25 mg  total) by mouth daily. 30 tablet 3   No current facility-administered medications for this visit.     Past Medical History:  Diagnosis Date  . AICD (automatic cardioverter/defibrillator) present   . Bacteremia due to group B Streptococcus    Recurrent admissions for group B Strepotococcus bacteremia of unknown source with TEEs negative for vegetation 06/2019 and 08/2019  . Biventricular ICD (implantable cardioverter-defibrillator) in place 03/24/2005   Implantation of a Medtronic Adapta ADDRO1, serial number [id number]  . CHF (congestive heart failure) ([place])   . CKD (chronic kidney disease), stage III   . Coronary artery disease    a. s/p CABG 1986. b. Multiple PCIs/caths. c. 09/2013: s/p PTCA and BMS to SVG-OM.  [name]    Requires sign language interpreter  . Dysrhythmia   . History of abdominal aortic aneurysm   . History of bleeding peptic ulcer 1980  . History of epididymitis 2013  . HTN (hypertension)   . Hydronephrosis with ureteropelvic junction obstruction   . Hydroureter on left 2009  . Hypertension   . Ischemic cardiomyopathy    a. Prior EF 30-35%, s/p BIV-ICD. b. 09/2013: EF 45-50%.  . Moderate tricuspid regurgitation   . PAF (paroxysmal atrial fibrillation) (HCC)    Not on [place] 2/2 GIB  . Presence of permanent  cardiac pacemaker 2002   Original placed in 2002 for CHB then 2007 and 2014 device change out  . Prostate cancer ([place])   . Status post coronary artery bypass grafting 1986   LIMA to the LAD, SVG to OM, SVG to RCA  . Testicular swelling     ROS:   All systems reviewed and negative except as noted in the HPI.   Past Surgical History:  Procedure Laterality Date  . 2-D echocardiogram  11/20/2011   Ejection fraction 30-35% moderate concentric left ventricular hypertrophy. Left atrium is moderately dilated. Mild MR. Mild or  . BI-VENTRICULAR IMPLANTABLE CARDIOVERTER DEFIBRILLATOR N/A 12/16/2012   Procedure: BI-VENTRICULAR IMPLANTABLE CARDIOVERTER DEFIBRILLATOR   (CRT-D);  Surgeon: [name], MD;  Location: [facility] CATH LAB;  Service: Cardiovascular;  Laterality: N/A;  . BIV ICD INSERTION CRT-D N/A 09/01/2019   Procedure: BIV ICD INSERTION CRT-D;  Surgeon: [name], MD;  Location: [place] CV LAB;  Service: Cardiovascular;  Laterality: N/A;  . CARDIAC CATHETERIZATION  12/10/2011   SVG to OM widely patent.  LIMA to LAD patent  . CATARACT EXTRACTION W/PHACO Right 10/12/2017   Procedure: CATARACT EXTRACTION PHACO AND INTRAOCULAR LENS PLACEMENT (IOC);  Surgeon: Birder Robson, MD;  Location: [facility] ORS;  Service: Ophthalmology;  Laterality: Right;  [place] 00:57 AP% 15.9 CDE 9.07 Fluid pack lot # [id number]  . COLONOSCOPY N/A 07/13/2018   Procedure: COLONOSCOPY;  Surgeon: Toledo, Benay Pike, MD;  Location: [facility] ENDOSCOPY;  Service: Gastroenterology;  Laterality: N/A;  . CORONARY ARTERY BYPASS GRAFT  1986  . ENTEROSCOPY N/A 09/14/2018   Procedure: ENTEROSCOPY;  Surgeon: Toledo, Benay Pike, MD;  Location: [facility] ENDOSCOPY;  Service: Gastroenterology;  Laterality: N/A;  symptomatic anemia, GI blood loss anemia, melena, positive small bowel capsule endoscopy showing source of bleeding   . ENTEROSCOPY N/A 08/22/2019   Procedure: ENTEROSCOPY;  Surgeon: Lin Landsman, MD;  Location: [facility] ENDOSCOPY;  Service: Gastroenterology;  Laterality: N/A;  . ESOPHAGOGASTRODUODENOSCOPY N/A 07/13/2018   Procedure: ESOPHAGOGASTRODUODENOSCOPY (EGD);  Surgeon: Toledo, Benay Pike, MD;  Location: [facility] ENDOSCOPY;  Service: Gastroenterology;  Laterality: N/A;  . ESOPHAGOGASTRODUODENOSCOPY N/A 09/14/2018   Procedure: ESOPHAGOGASTRODUODENOSCOPY (EGD);  Surgeon: Toledo, Benay Pike, MD;  Location: [facility] ENDOSCOPY;  Service: Gastroenterology;  Laterality: N/A;  SIGN LANAGUAGE INTERPRETER  . ESOPHAGOGASTRODUODENOSCOPY N/A 06/21/2019   Procedure: ESOPHAGOGASTRODUODENOSCOPY (EGD);  Surgeon: Toledo, Benay Pike, MD;  Location: [facility] ENDOSCOPY;  Service: Gastroenterology;  Laterality: N/A;  .  ESOPHAGOGASTRODUODENOSCOPY (EGD) WITH PROPOFOL N/A 05/27/2018   Procedure: ESOPHAGOGASTRODUODENOSCOPY (EGD) WITH PROPOFOL;  Surgeon: Clarene Essex, MD;  Location: [place];  Service: Endoscopy;  Laterality: N/A;  . ICD LEAD REMOVAL Left 08/25/2019   Procedure: ICD LEAD EXTRACTION;  Surgeon: [name], MD;  Location: [place];  Service: Cardiovascular;  Laterality: Left;  DR. Roxan Hockey BACK UP  . INSERT / REPLACE / REMOVE PACEMAKER    . LEFT HEART CATHETERIZATION WITH CORONARY/GRAFT ANGIOGRAM N/A 12/10/2011   Procedure: LEFT HEART CATHETERIZATION WITH Beatrix Fetters;  Surgeon: Sanda Klein, MD;  Location: [place] CATH LAB;  Service: Cardiovascular;  Laterality: N/A;  . LEFT HEART CATHETERIZATION WITH CORONARY/GRAFT ANGIOGRAM N/A 09/25/2013   Procedure: LEFT HEART CATHETERIZATION WITH Beatrix Fetters;  Surgeon: Blane Ohara, MD;  Location[facility] CATH LAB;  Service: Cardiovascular;  Laterality: N/A;  . MULTIPLE EXTRACTIONS WITH ALVEOLOPLASTY Bilateral 08/30/2019   Procedure: Extractionof tooth #'s 2, 28, and 31 with alveoloplasty and gross debridement of remaining teeth.;  Surgeon: [name], DDS;  Location: [place];  Service: Oral  Surgery;  Laterality: Bilateral;  . Persantine Myoview  05/06/2010   Post-rest ejection fraction 30%. No significant ischemia demonstrated. Compared to previous study there is no significant change.  . TEE WITHOUT CARDIOVERSION N/A 06/22/2019   Procedure: TRANSESOPHAGEAL ECHOCARDIOGRAM (TEE);  Surgeon: Minna Merritts, MD;  Location: [facility] ORS;  Service: Cardiovascular;  Laterality: N/A;  . TEE WITHOUT CARDIOVERSION N/A 08/23/2019   Procedure: TRANSESOPHAGEAL ECHOCARDIOGRAM (TEE);  Surgeon: Kate Sable, MD;  Location: [facility] ORS;  Service: Cardiovascular;  Laterality: N/A;  . TEE WITHOUT CARDIOVERSION N/A 08/25/2019   Procedure: TRANSESOPHAGEAL ECHOCARDIOGRAM (TEE);  Surgeon: [name], MD;  Location: [facility] OR;  Service: Cardiovascular;   Laterality: N/A;  . TRANSURETHRAL RESECTION OF PROSTATE     s/p     Family History  Problem Relation Age of Onset  . Hypertension Father      Social History   Socioeconomic History  . Marital status: Married    Spouse name: Not on file  . Number of children: Not on file  . Years of education: Not on file  . Highest education level: Not on file  Occupational History  . Not on file  Tobacco Use  . Smoking status: Former Smoker    Quit date: 03/15/1985    Years since quitting: 34.7  . Smokeless tobacco: Never Used  Vaping Use  . Vaping Use: Never used  Substance and Sexual Activity  . Alcohol use: No    Comment: occas.  . Drug use: No  . Sexual activity: Yes  Other Topics Concern  . Not on file  Social History Narrative  . Not on file   Social Determinants of Health   Financial Resource Strain:   . Difficulty of Paying Living Expenses: Not on file  Food Insecurity:   . Worried About Charity fundraiser in the Last Year: Not on file  . Ran Out of Food in the Last Year: Not on file  Transportation Needs:   . Lack of Transportation (Medical): Not on file  . Lack of Transportation (Non-Medical): Not on file  Physical Activity:   . Days of Exercise per Week: Not on file  . Minutes of Exercise per Session: Not on file  Stress:   . Feeling of Stress : Not on file  Social Connections:   . Frequency of Communication with Friends and Family: Not on file  . Frequency of Social Gatherings with Friends and Family: Not on file  . Attends Religious Services: Not on file  . Active Member of Clubs or Organizations: Not on file  . Attends Archivist Meetings: Not on file  . Marital Status: Not on file  Intimate Partner Violence:   . Fear of Current or Ex-Partner: Not on file  . Emotionally Abused: Not on file  . Physically Abused: Not on file  . Sexually Abused: Not on file     BP 112/60   Pulse 93   Ht 6\' 2"  (1.88 m)   Wt 184 lb 12.8 oz (83.8 kg)   SpO2  97%   BMI 23.73 kg/m   Physical Exam:  Well appearing NAD HEENT: Unremarkable Neck:  No JVD, no thyromegally Lymphatics:  No adenopathy Back:  No CVA tenderness Lungs:  Clear with no wheezes; well healed ICD incision. HEART:  Regular rate rhythm, no murmurs, no rubs, no clicks Abd:  soft, positive bowel sounds, no organomegally, no rebound, no guarding Ext:  2 plus pulses, no edema, no cyanosis, no clubbing Skin:  No  rashes no nodules Neuro:  CN II through XII intact, motor grossly intact  EKG - atrial fib with ventricular pacing  DEVICE  Normal device function.  See PaceArt for details.   Assess/Plan: 1. CHB - he is asymptomatic, s/p ICD insertion. No escape. 2. ICD - his medtronic Biv ICD is working normally. 7 years of battery longevity. 3. Chronic atrial fib - his rates are well controlled.  4. CAD - he denies anginal symptoms.   Carleene Overlie Edita Weyenberg,MD

## 2019-12-04 NOTE — Patient Instructions (Signed)
Medication Instructions:  Your physician recommends that you continue on your current medications as directed. Please refer to the Current Medication list given to you today.  Labwork: None ordered.  Testing/Procedures: None ordered.  Follow-Up: Your physician wants you to follow-up in: one year with Dr. [name].   You will receive a reminder letter in the mail two months in advance. If you don't receive a letter, please call our office to schedule the follow-up appointment.  Remote monitoring is used to monitor your ICD from home. This monitoring reduces the number of office visits required to check your device to one time per year. It allows [place] to keep an eye on the functioning of your device to ensure it is working properly. You are scheduled for a device check from home on 12/12/2019. You may send your transmission at any time that day. If you have a wireless device, the transmission will be sent automatically. After your physician reviews your transmission, you will receive a postcard with your next transmission date.  Any Other Special Instructions Will Be Listed Below (If Applicable).  If you need a refill on your cardiac medications before your next appointment, please call your pharmacy.

## 2019-12-05 ENCOUNTER — Inpatient Hospital Stay: Payer: PPO

## 2019-12-05 ENCOUNTER — Inpatient Hospital Stay: Payer: PPO | Attending: Oncology | Admitting: Oncology

## 2019-12-05 ENCOUNTER — Encounter: Payer: Self-pay | Admitting: Oncology

## 2019-12-05 VITALS — BP 106/65 | HR 70 | Temp 98.4°F | Resp 20 | Wt 183.1 lb

## 2019-12-05 DIAGNOSIS — Q2733 Arteriovenous malformation of digestive system vessel: Secondary | ICD-10-CM | POA: Diagnosis not present

## 2019-12-05 DIAGNOSIS — D509 Iron deficiency anemia, unspecified: Secondary | ICD-10-CM

## 2019-12-05 DIAGNOSIS — I110 Hypertensive heart disease with heart failure: Secondary | ICD-10-CM

## 2019-12-05 DIAGNOSIS — Q2730 Arteriovenous malformation, site unspecified: Secondary | ICD-10-CM

## 2019-12-05 DIAGNOSIS — Z87891 Personal history of nicotine dependence: Secondary | ICD-10-CM | POA: Diagnosis not present

## 2019-12-05 DIAGNOSIS — Z79899 Other long term (current) drug therapy: Secondary | ICD-10-CM

## 2019-12-05 DIAGNOSIS — Z95810 Presence of automatic (implantable) cardiac defibrillator: Secondary | ICD-10-CM | POA: Diagnosis not present

## 2019-12-05 DIAGNOSIS — I480 Paroxysmal atrial fibrillation: Secondary | ICD-10-CM | POA: Diagnosis not present

## 2019-12-05 DIAGNOSIS — I2510 Atherosclerotic heart disease of native coronary artery without angina pectoris: Secondary | ICD-10-CM | POA: Diagnosis not present

## 2019-12-05 DIAGNOSIS — D500 Iron deficiency anemia secondary to blood loss (chronic): Secondary | ICD-10-CM | POA: Diagnosis not present

## 2019-12-05 DIAGNOSIS — I509 Heart failure, unspecified: Secondary | ICD-10-CM

## 2019-12-05 DIAGNOSIS — Z8249 Family history of ischemic heart disease and other diseases of the circulatory system: Secondary | ICD-10-CM

## 2019-12-05 DIAGNOSIS — Z8546 Personal history of malignant neoplasm of prostate: Secondary | ICD-10-CM | POA: Diagnosis not present

## 2019-12-05 DIAGNOSIS — N183 Chronic kidney disease, stage 3 unspecified: Secondary | ICD-10-CM | POA: Diagnosis not present

## 2019-12-05 LAB — CBC
HCT: 28.7 % — ABNORMAL LOW (ref 39.0–52.0)
Hemoglobin: 8.9 g/dL — ABNORMAL LOW (ref 13.0–17.0)
MCH: 24.9 pg — ABNORMAL LOW (ref 26.0–34.0)
MCHC: 31 g/dL (ref 30.0–36.0)
MCV: 80.2 fL (ref 80.0–100.0)
Platelets: 320 10*3/uL (ref 150–400)
RBC: 3.58 MIL/uL — ABNORMAL LOW (ref 4.22–5.81)
RDW: 17 % — ABNORMAL HIGH (ref 11.5–15.5)
WBC: 7.9 10*3/uL (ref 4.0–10.5)
nRBC: 0 % (ref 0.0–0.2)

## 2019-12-05 LAB — FERRITIN: Ferritin: 46 ng/mL (ref 24–336)

## 2019-12-05 LAB — IRON AND TIBC
Iron: 52 ug/dL (ref 45–182)
Saturation Ratios: 20 % (ref 17.9–39.5)
TIBC: 265 ug/dL (ref 250–450)
UIBC: 213 ug/dL

## 2019-12-05 LAB — RETICULOCYTES
Immature Retic Fract: 30.8 % — ABNORMAL HIGH (ref 2.3–15.9)
RBC.: 3.57 MIL/uL — ABNORMAL LOW (ref 4.22–5.81)
Retic Count, Absolute: 63.5 10*3/uL (ref 19.0–186.0)
Retic Ct Pct: 1.8 % (ref 0.4–3.1)

## 2019-12-05 LAB — VITAMIN B12: Vitamin B-12: 579 pg/mL (ref 180–914)

## 2019-12-05 LAB — FOLATE: Folate: 19.2 ng/mL (ref >5.9)

## 2019-12-05 LAB — LACTATE DEHYDROGENASE: LDH: 159 U/L (ref 98–192)

## 2019-12-07 ENCOUNTER — Other Ambulatory Visit: Payer: Self-pay

## 2019-12-07 ENCOUNTER — Telehealth: Payer: Self-pay | Admitting: Internal Medicine

## 2019-12-07 LAB — CUP PACEART INCLINIC DEVICE CHECK
Battery Remaining Longevity: 95 mo
Battery Voltage: 3.03 V
Brady Statistic AP VP Percent: 0 %
Brady Statistic AP VS Percent: 0 %
Brady Statistic AS VP Percent: 0 %
Brady Statistic AS VS Percent: 0 %
Brady Statistic RA Percent Paced: 0 %
Brady Statistic RV Percent Paced: 94.04 %
Date Time Interrogation Session: 20210920172801
HighPow Impedance: 61 Ohm
Implantable Lead Implant Date: 20210618
Implantable Lead Implant Date: 20210618
Implantable Lead Location: 753858
Implantable Lead Location: 753860
Implantable Lead Model: 4398
Implantable Pulse Generator Implant Date: 20210618
Lead Channel Impedance Value: 132.321
Lead Channel Impedance Value: 136.276
Lead Channel Impedance Value: 136.276
Lead Channel Impedance Value: 147.097
Lead Channel Impedance Value: 147.097
Lead Channel Impedance Value: 247 Ohm
Lead Channel Impedance Value: 285 Ohm
Lead Channel Impedance Value: 304 Ohm
Lead Channel Impedance Value: 304 Ohm
Lead Channel Impedance Value: 342 Ohm
Lead Channel Impedance Value: 4047 Ohm
Lead Channel Impedance Value: 418 Ohm
Lead Channel Impedance Value: 456 Ohm
Lead Channel Impedance Value: 513 Ohm
Lead Channel Impedance Value: 513 Ohm
Lead Channel Impedance Value: 513 Ohm
Lead Channel Impedance Value: 513 Ohm
Lead Channel Impedance Value: 551 Ohm
Lead Channel Pacing Threshold Amplitude: 0.625 V
Lead Channel Pacing Threshold Amplitude: 0.625 V
Lead Channel Pacing Threshold Pulse Width: 0.4 ms
Lead Channel Pacing Threshold Pulse Width: 0.4 ms
Lead Channel Sensing Intrinsic Amplitude: 0.25 mV
Lead Channel Sensing Intrinsic Amplitude: 1.875 mV
Lead Channel Sensing Intrinsic Amplitude: 18.125 mV
Lead Channel Setting Pacing Amplitude: 1.25 V
Lead Channel Setting Pacing Amplitude: 2.5 V
Lead Channel Setting Pacing Pulse Width: 0.4 ms
Lead Channel Setting Pacing Pulse Width: 0.4 ms
Lead Channel Setting Sensing Sensitivity: 0.3 mV

## 2019-12-07 MED ORDER — MIDODRINE HCL 5 MG PO TABS: 5.0000 mg | ORAL_TABLET | Freq: Three times a day (TID) | ORAL | 0 refills | Status: DC

## 2019-12-07 NOTE — Telephone Encounter (Signed)
*  STAT* If patient is at the pharmacy, call can be transferred to refill team.   1. Which medications need to be refilled? (please list name of each medication and dose if known) midodrine (PROAMATINE) 5 MG tablet  2. Which pharmacy/location (including street and city if local pharmacy) is medication to be sent to? [id number] [address], [place]  3. Do they need a 30 day or 90 day supply? 30 day

## 2019-12-07 NOTE — Telephone Encounter (Signed)
midodrine (PROAMATINE) 5 MG tablet 90 tablet 0 12/07/2019    Sig - Route: Take 1 tablet (5 mg total) by mouth 3 (three) times daily with meals. - Oral   Sent to pharmacy as: midodrine (PROAMATINE) 5 MG tablet   E-Prescribing Status: Receipt confirmed by pharmacy (12/07/2019  2:29 PM EDT)   Pharmacy  [id number] - [place], [place]

## 2019-12-11 ENCOUNTER — Inpatient Hospital Stay: Payer: PPO

## 2019-12-11 ENCOUNTER — Other Ambulatory Visit: Payer: Self-pay

## 2019-12-11 VITALS — BP 112/62 | HR 75 | Temp 98.9°F | Resp 20

## 2019-12-11 DIAGNOSIS — D500 Iron deficiency anemia secondary to blood loss (chronic): Secondary | ICD-10-CM | POA: Diagnosis not present

## 2019-12-11 MED ORDER — IRON SUCROSE 20 MG/ML IV SOLN
200.0000 mg | Freq: Once | INTRAVENOUS | Status: AC
  Administered 2019-12-11: 200 mg via INTRAVENOUS
  Filled 2019-12-11: qty 10

## 2019-12-11 MED ORDER — SODIUM CHLORIDE 0.9 % IV SOLN: 200.0000 mg | Freq: Once | INTRAVENOUS | Status: DC

## 2019-12-11 MED ORDER — SODIUM CHLORIDE 0.9 % IV SOLN
Freq: Once | INTRAVENOUS | Status: AC
  Filled 2019-12-11: qty 250

## 2019-12-12 ENCOUNTER — Ambulatory Visit (INDEPENDENT_AMBULATORY_CARE_PROVIDER_SITE_OTHER): Payer: PPO | Admitting: Emergency Medicine

## 2019-12-12 DIAGNOSIS — I442 Atrioventricular block, complete: Secondary | ICD-10-CM

## 2019-12-14 LAB — CUP PACEART REMOTE DEVICE CHECK
Battery Remaining Longevity: 93 mo
Battery Voltage: 3.04 V
Brady Statistic AP VP Percent: 0 %
Brady Statistic AP VS Percent: 0 %
Brady Statistic AS VP Percent: 0 %
Brady Statistic AS VS Percent: 0 %
Brady Statistic RA Percent Paced: 0 %
Brady Statistic RV Percent Paced: 96.21 %
Date Time Interrogation Session: 20210929124825
HighPow Impedance: 61 Ohm
Implantable Lead Implant Date: 20210618
Implantable Lead Implant Date: 20210618
Implantable Lead Location: 753858
Implantable Lead Location: 753860
Implantable Lead Model: 4398
Implantable Pulse Generator Implant Date: 20210618
Lead Channel Impedance Value: 147.097
Lead Channel Impedance Value: 147.097
Lead Channel Impedance Value: 147.097
Lead Channel Impedance Value: 152 Ohm
Lead Channel Impedance Value: 152 Ohm
Lead Channel Impedance Value: 285 Ohm
Lead Channel Impedance Value: 304 Ohm
Lead Channel Impedance Value: 304 Ohm
Lead Channel Impedance Value: 304 Ohm
Lead Channel Impedance Value: 304 Ohm
Lead Channel Impedance Value: 342 Ohm
Lead Channel Impedance Value: 399 Ohm
Lead Channel Impedance Value: 4047 Ohm
Lead Channel Impedance Value: 513 Ohm
Lead Channel Impedance Value: 513 Ohm
Lead Channel Impedance Value: 513 Ohm
Lead Channel Impedance Value: 532 Ohm
Lead Channel Impedance Value: 551 Ohm
Lead Channel Pacing Threshold Amplitude: 0.5 V
Lead Channel Pacing Threshold Amplitude: 0.75 V
Lead Channel Pacing Threshold Pulse Width: 0.4 ms
Lead Channel Pacing Threshold Pulse Width: 0.4 ms
Lead Channel Sensing Intrinsic Amplitude: 0.25 mV
Lead Channel Sensing Intrinsic Amplitude: 1.875 mV
Lead Channel Sensing Intrinsic Amplitude: 18.125 mV
Lead Channel Setting Pacing Amplitude: 1 V
Lead Channel Setting Pacing Amplitude: 2.5 V
Lead Channel Setting Pacing Pulse Width: 0.4 ms
Lead Channel Setting Pacing Pulse Width: 0.4 ms
Lead Channel Setting Sensing Sensitivity: 0.3 mV

## 2019-12-15 NOTE — Progress Notes (Signed)
Remote ICD transmission.   

## 2019-12-18 ENCOUNTER — Inpatient Hospital Stay: Payer: PPO | Attending: Oncology

## 2019-12-18 ENCOUNTER — Other Ambulatory Visit: Payer: Self-pay

## 2019-12-18 ENCOUNTER — Ambulatory Visit (INDEPENDENT_AMBULATORY_CARE_PROVIDER_SITE_OTHER): Payer: PPO

## 2019-12-18 VITALS — BP 126/71 | HR 71 | Temp 98.7°F | Resp 18

## 2019-12-18 DIAGNOSIS — I5022 Chronic systolic (congestive) heart failure: Secondary | ICD-10-CM

## 2019-12-18 DIAGNOSIS — D500 Iron deficiency anemia secondary to blood loss (chronic): Secondary | ICD-10-CM

## 2019-12-18 DIAGNOSIS — Z95810 Presence of automatic (implantable) cardiac defibrillator: Secondary | ICD-10-CM

## 2019-12-18 DIAGNOSIS — D509 Iron deficiency anemia, unspecified: Secondary | ICD-10-CM | POA: Diagnosis not present

## 2019-12-18 MED ORDER — IRON SUCROSE 20 MG/ML IV SOLN
200.0000 mg | Freq: Once | INTRAVENOUS | Status: AC
  Administered 2019-12-18: 200 mg via INTRAVENOUS
  Filled 2019-12-18: qty 10

## 2019-12-18 MED ORDER — SODIUM CHLORIDE 0.9 % IV SOLN: 200.0000 mg | Freq: Once | INTRAVENOUS | Status: DC

## 2019-12-18 MED ORDER — SODIUM CHLORIDE 0.9 % IV SOLN
Freq: Once | INTRAVENOUS | Status: AC
  Filled 2019-12-18: qty 250

## 2019-12-19 NOTE — Progress Notes (Signed)
EPIC Encounter for ICM Monitoring  Patient Name: Thomas [name] is a 85 y.o. male Date: 12/19/2019 Primary Care Physican: [name], MD Primary Cardiologist: Croitoru Electrophysiologist: Santina Evans Pacing: 94.7%         12/01/2019 Weight: 176 lbs                                                            Spoke with wife via hearing impaired interpreter and reports pt is feeling well at this time.  Denies fluid symptoms.  Reviewed transmission and explained fluid levels have returned to normal.   Optivol thoracic impedance suggesting fluid levels have returned to normal.   Prescribed:   Torsemide 20 mg take 2 tablets (40 mg total) twice a day.  Spironolactone 25 mg take 1 tablet daily  Labs: 10/24/2019 Creatinine 1.35, BUN 36, Potassium 3.9, Sodium 142, GFR 49-56 09/12/2019 Creatinine 1.00, BUN 16, Potassium 4.0, Sodium 143, GFR >60  09/11/2019 Creatinine 0.96, BUN 15, Potassium 3.7, Sodium 141, GFR >60  09/10/2019 Creatinine 0.98, BUN 13, Potassium 3.6, Sodium 140, GFR >60  09/09/2019 Creatinine 0.92, BUN 13, Potassium 3.3, Sodium 139, GFR >60  09/08/2019 Creatinine 1.02, BUN 13, Potassium 3.6, Sodium 137, GFR >60  A complete set of results can be found in Results Review.  Recommendations: No changes and encouraged to call if experiencing any fluid symptoms.  Follow-up plan: ICM clinic phone appointment on 01/08/2020.   91 day device clinic remote transmission 03/12/2020.    EP/Cardiology Office Visits:  01/22/2020 with Dr Sallyanne Kuster.    Copy of ICM check sent to Dr. [name].  3 month ICM trend: 12/19/2019    1 Year ICM trend:       Rosalene Billings, RN 12/19/2019 3:49 PM

## 2019-12-29 ENCOUNTER — Other Ambulatory Visit: Payer: Self-pay | Admitting: Cardiovascular Disease

## 2019-12-29 NOTE — Telephone Encounter (Signed)
*  STAT* If patient is at the pharmacy, call can be transferred to refill team.   1. Which medications need to be refilled? (please list name of each medication and dose if known)   pantoprazole (PROTONIX) 40 MG tablet   2. Which pharmacy/location (including street and city if local pharmacy) is medication to be sent to? [id number] - [place], [place] - [address]  3. Do they need a 30 day or 90 day supply? 90  

## 2020-01-07 ENCOUNTER — Other Ambulatory Visit: Payer: Self-pay | Admitting: Internal Medicine

## 2020-01-08 ENCOUNTER — Ambulatory Visit (INDEPENDENT_AMBULATORY_CARE_PROVIDER_SITE_OTHER): Payer: PPO

## 2020-01-08 DIAGNOSIS — Z95810 Presence of automatic (implantable) cardiac defibrillator: Secondary | ICD-10-CM | POA: Diagnosis not present

## 2020-01-08 DIAGNOSIS — I5022 Chronic systolic (congestive) heart failure: Secondary | ICD-10-CM

## 2020-01-12 ENCOUNTER — Other Ambulatory Visit: Payer: Self-pay | Admitting: Cardiovascular Disease

## 2020-01-12 ENCOUNTER — Telehealth: Payer: Self-pay

## 2020-01-12 NOTE — Telephone Encounter (Signed)
Remote ICM transmission received.  Attempted call to wife regarding ICM remote transmission and left message to return call.  

## 2020-01-12 NOTE — Progress Notes (Signed)
EPIC Encounter for ICM Monitoring  Patient Name: Thomas [name] is a 85 y.o. male Date: 01/12/2020 Primary Care Physican: [name], MD Primary Cardiologist:Croitoru Electrophysiologist:Taylor Bi-V Pacing:97% 9/17/2021Weight:176lbs    Spoke with wife and denies patient is having any fluid symptoms.  He has fallen twice in the las 2 weeks with mild injuries to arm but has recovered.  Patient is not taking Torsemide as prescribed and wife has tried to tell him the importance of taking the medication but he is not listening to her.  Explained by not taking Torsemide this may contribute to possible fluid accumulation.    Optivol thoracic impedance suggesting possible fluid accumulation starting 12/18/2019 but trended back to baseline day transmission 01/08/2020.   Prescribed:   Torsemide20 mg take 2 tablets (40 mg total) twice a day.  Spironolactone 25 mg take 1 tablet daily  Labs: 10/24/2019 Creatinine1.35, BUN36, Potassium3.9, [id number], [id number] 09/12/2019 Creatinine1.00, BUN16, Potassium4.0, [id number], GFR>60  09/11/2019 Creatinine0.96, BUN15, Potassium3.7, Sodium141, GFR>60  09/10/2019 Creatinine0.98, BUN13, Potassium3.6, Sodium140, GFR>60  09/09/2019 Creatinine0.92, BUN13, Potassium3.3, Sodium139, GFR>60  09/08/2019 Creatinine1.02, BUN13, Potassium3.6, Sodium137, GFR>60 A complete set of results can be found in Results Review.  Recommendations: Wife asked that Dr Sallyanne Kuster discuss with patient at 11/8 OV about the importance of taking Torsemide as prescribed.   Follow-up plan: ICM clinic phone appointment on11/29/2021. 91 day device clinic remote transmission 03/12/2020.   EP/Cardiology Office Visits: 01/22/2020 with Dr Sallyanne Kuster.   Copy of ICM check sent to Dr.Taylor.   3 month ICM trend: 01/08/2020    1 Year ICM trend:       Rosalene Billings,  RN 01/12/2020 12:53 PM

## 2020-01-12 NOTE — Telephone Encounter (Signed)
*  STAT* If patient is at the pharmacy, call can be transferred to refill team.   1. Which medications need to be refilled? (please list name of each medication and dose if known)   pantoprazole (PROTONIX) 40 MG tablet   2. Which pharmacy/location (including street and city if local pharmacy) is medication to be sent to? [id number] [address], [place]  3. Do they need a 30 day or 90 day supply? [place]

## 2020-01-15 ENCOUNTER — Other Ambulatory Visit: Payer: Self-pay

## 2020-01-15 ENCOUNTER — Encounter: Payer: Self-pay | Admitting: Radiology

## 2020-01-15 ENCOUNTER — Emergency Department: Payer: PPO

## 2020-01-15 ENCOUNTER — Inpatient Hospital Stay: Payer: PPO | Admitting: Anesthesiology

## 2020-01-15 ENCOUNTER — Encounter
Admission: EM | Disposition: A | Discharge status: Home Health | Payer: Self-pay | Source: Home / Self Care | Attending: Internal Medicine

## 2020-01-15 ENCOUNTER — Inpatient Hospital Stay
Admission: EM | Admit: 2020-01-15 | Discharge: 2020-01-21 | DRG: 982 | Disposition: A | Discharge status: Home Health | Payer: PPO | Attending: Internal Medicine | Admitting: Internal Medicine

## 2020-01-15 DIAGNOSIS — K922 Gastrointestinal hemorrhage, unspecified: Secondary | ICD-10-CM | POA: Diagnosis not present

## 2020-01-15 DIAGNOSIS — R1111 Vomiting without nausea: Secondary | ICD-10-CM | POA: Diagnosis not present

## 2020-01-15 DIAGNOSIS — I130 Hypertensive heart and chronic kidney disease with heart failure and stage 1 through stage 4 chronic kidney disease, or unspecified chronic kidney disease: Secondary | ICD-10-CM | POA: Diagnosis present

## 2020-01-15 DIAGNOSIS — Z23 Encounter for immunization: Secondary | ICD-10-CM | POA: Diagnosis not present

## 2020-01-15 DIAGNOSIS — R231 Pallor: Secondary | ICD-10-CM | POA: Diagnosis not present

## 2020-01-15 DIAGNOSIS — Z20822 Contact with and (suspected) exposure to covid-19: Secondary | ICD-10-CM | POA: Diagnosis not present

## 2020-01-15 DIAGNOSIS — J9811 Atelectasis: Secondary | ICD-10-CM | POA: Diagnosis present

## 2020-01-15 DIAGNOSIS — Z8619 Personal history of other infectious and parasitic diseases: Secondary | ICD-10-CM | POA: Diagnosis not present

## 2020-01-15 DIAGNOSIS — I2510 Atherosclerotic heart disease of native coronary artery without angina pectoris: Secondary | ICD-10-CM | POA: Diagnosis present

## 2020-01-15 DIAGNOSIS — I5032 Chronic diastolic (congestive) heart failure: Secondary | ICD-10-CM

## 2020-01-15 DIAGNOSIS — Z95828 Presence of other vascular implants and grafts: Secondary | ICD-10-CM

## 2020-01-15 DIAGNOSIS — K264 Chronic or unspecified duodenal ulcer with hemorrhage: Secondary | ICD-10-CM | POA: Diagnosis not present

## 2020-01-15 DIAGNOSIS — K8689 Other specified diseases of pancreas: Secondary | ICD-10-CM | POA: Diagnosis not present

## 2020-01-15 DIAGNOSIS — I10 Essential (primary) hypertension: Secondary | ICD-10-CM | POA: Diagnosis not present

## 2020-01-15 DIAGNOSIS — Z8249 Family history of ischemic heart disease and other diseases of the circulatory system: Secondary | ICD-10-CM

## 2020-01-15 DIAGNOSIS — E782 Mixed hyperlipidemia: Secondary | ICD-10-CM | POA: Diagnosis not present

## 2020-01-15 DIAGNOSIS — Z951 Presence of aortocoronary bypass graft: Secondary | ICD-10-CM

## 2020-01-15 DIAGNOSIS — Z9861 Coronary angioplasty status: Secondary | ICD-10-CM | POA: Diagnosis not present

## 2020-01-15 DIAGNOSIS — E785 Hyperlipidemia, unspecified: Secondary | ICD-10-CM | POA: Diagnosis present

## 2020-01-15 DIAGNOSIS — K31811 Angiodysplasia of stomach and duodenum with bleeding: Secondary | ICD-10-CM | POA: Diagnosis present

## 2020-01-15 DIAGNOSIS — N1831 Chronic kidney disease, stage 3a: Secondary | ICD-10-CM | POA: Diagnosis not present

## 2020-01-15 DIAGNOSIS — Z8679 Personal history of other diseases of the circulatory system: Secondary | ICD-10-CM

## 2020-01-15 DIAGNOSIS — I5042 Chronic combined systolic (congestive) and diastolic (congestive) heart failure: Secondary | ICD-10-CM | POA: Diagnosis present

## 2020-01-15 DIAGNOSIS — Z79899 Other long term (current) drug therapy: Secondary | ICD-10-CM

## 2020-01-15 DIAGNOSIS — K7469 Other cirrhosis of liver: Secondary | ICD-10-CM | POA: Diagnosis present

## 2020-01-15 DIAGNOSIS — Z8546 Personal history of malignant neoplasm of prostate: Secondary | ICD-10-CM

## 2020-01-15 DIAGNOSIS — I5043 Acute on chronic combined systolic (congestive) and diastolic (congestive) heart failure: Secondary | ICD-10-CM | POA: Diagnosis not present

## 2020-01-15 DIAGNOSIS — K269 Duodenal ulcer, unspecified as acute or chronic, without hemorrhage or perforation: Secondary | ICD-10-CM | POA: Diagnosis not present

## 2020-01-15 DIAGNOSIS — H9193 Unspecified hearing loss, bilateral: Secondary | ICD-10-CM | POA: Diagnosis not present

## 2020-01-15 DIAGNOSIS — I4821 Permanent atrial fibrillation: Secondary | ICD-10-CM | POA: Diagnosis present

## 2020-01-15 DIAGNOSIS — I723 Aneurysm of iliac artery: Secondary | ICD-10-CM | POA: Diagnosis not present

## 2020-01-15 DIAGNOSIS — R52 Pain, unspecified: Secondary | ICD-10-CM | POA: Diagnosis not present

## 2020-01-15 DIAGNOSIS — Z8701 Personal history of pneumonia (recurrent): Secondary | ICD-10-CM

## 2020-01-15 DIAGNOSIS — Z95810 Presence of automatic (implantable) cardiac defibrillator: Secondary | ICD-10-CM | POA: Diagnosis not present

## 2020-01-15 DIAGNOSIS — D62 Acute posthemorrhagic anemia: Secondary | ICD-10-CM | POA: Diagnosis present

## 2020-01-15 DIAGNOSIS — Z87891 Personal history of nicotine dependence: Secondary | ICD-10-CM

## 2020-01-15 DIAGNOSIS — I255 Ischemic cardiomyopathy: Secondary | ICD-10-CM | POA: Diagnosis present

## 2020-01-15 DIAGNOSIS — M545 Low back pain, unspecified: Secondary | ICD-10-CM | POA: Diagnosis not present

## 2020-01-15 DIAGNOSIS — K766 Portal hypertension: Secondary | ICD-10-CM | POA: Diagnosis present

## 2020-01-15 DIAGNOSIS — Z791 Long term (current) use of non-steroidal anti-inflammatories (NSAID): Secondary | ICD-10-CM | POA: Diagnosis not present

## 2020-01-15 DIAGNOSIS — K31819 Angiodysplasia of stomach and duodenum without bleeding: Secondary | ICD-10-CM

## 2020-01-15 DIAGNOSIS — N183 Chronic kidney disease, stage 3 unspecified: Secondary | ICD-10-CM | POA: Diagnosis present

## 2020-01-15 DIAGNOSIS — I701 Atherosclerosis of renal artery: Secondary | ICD-10-CM | POA: Diagnosis not present

## 2020-01-15 DIAGNOSIS — N2889 Other specified disorders of kidney and ureter: Secondary | ICD-10-CM | POA: Diagnosis not present

## 2020-01-15 DIAGNOSIS — R58 Hemorrhage, not elsewhere classified: Secondary | ICD-10-CM | POA: Diagnosis not present

## 2020-01-15 HISTORY — PX: ESOPHAGOGASTRODUODENOSCOPY: SHX5428

## 2020-01-15 LAB — CBC WITH DIFFERENTIAL/PLATELET
Abs Immature Granulocytes: 0.03 10*3/uL (ref 0.00–0.07)
Basophils Absolute: 0.1 10*3/uL (ref 0.0–0.1)
Basophils Relative: 1 %
Eosinophils Absolute: 0 10*3/uL (ref 0.0–0.5)
Eosinophils Relative: 1 %
HCT: 20.6 % — ABNORMAL LOW (ref 39.0–52.0)
Hemoglobin: 6.2 g/dL — ABNORMAL LOW (ref 13.0–17.0)
Immature Granulocytes: 0 %
Lymphocytes Relative: 12 %
Lymphs Abs: 0.8 10*3/uL (ref 0.7–4.0)
MCH: 25.2 pg — ABNORMAL LOW (ref 26.0–34.0)
MCHC: 30.1 g/dL (ref 30.0–36.0)
MCV: 83.7 fL (ref 80.0–100.0)
Monocytes Absolute: 0.4 10*3/uL (ref 0.1–1.0)
Monocytes Relative: 6 %
Neutro Abs: 5.4 10*3/uL (ref 1.7–7.7)
Neutrophils Relative %: 80 %
Platelets: 244 10*3/uL (ref 150–400)
RBC: 2.46 MIL/uL — ABNORMAL LOW (ref 4.22–5.81)
RDW: 20.9 % — ABNORMAL HIGH (ref 11.5–15.5)
WBC: 6.7 10*3/uL (ref 4.0–10.5)
nRBC: 0 % (ref 0.0–0.2)

## 2020-01-15 LAB — COMPREHENSIVE METABOLIC PANEL
ALT: 11 U/L (ref 0–44)
AST: 18 U/L (ref 15–41)
Albumin: 2.2 g/dL — ABNORMAL LOW (ref 3.5–5.0)
Alkaline Phosphatase: 121 U/L (ref 38–126)
Anion gap: 4 — ABNORMAL LOW (ref 5–15)
BUN: 31 mg/dL — ABNORMAL HIGH (ref 8–23)
CO2: 26 mmol/L (ref 22–32)
Calcium: 8.3 mg/dL — ABNORMAL LOW (ref 8.9–10.3)
Chloride: 111 mmol/L (ref 98–111)
Creatinine, Ser: 1.17 mg/dL (ref 0.61–1.24)
GFR, Estimated: >60 mL/min (ref >60)
Glucose, Bld: 117 mg/dL — ABNORMAL HIGH (ref 70–99)
Potassium: 5.3 mmol/L — ABNORMAL HIGH (ref 3.5–5.1)
Sodium: 141 mmol/L (ref 135–145)
Total Bilirubin: 0.6 mg/dL (ref 0.3–1.2)
Total Protein: 5.1 g/dL — ABNORMAL LOW (ref 6.5–8.1)

## 2020-01-15 LAB — RESPIRATORY PANEL BY RT PCR (FLU A&B, COVID)
Influenza A by PCR: NEGATIVE
Influenza B by PCR: NEGATIVE
SARS Coronavirus 2 by RT PCR: NEGATIVE

## 2020-01-15 LAB — PREPARE RBC (CROSSMATCH)

## 2020-01-15 LAB — HEMOGLOBIN AND HEMATOCRIT, BLOOD
HCT: 22.8 % — ABNORMAL LOW (ref 39.0–52.0)
Hemoglobin: 7.1 g/dL — ABNORMAL LOW (ref 13.0–17.0)

## 2020-01-15 LAB — PROTIME-INR
INR: 1.3 — ABNORMAL HIGH (ref 0.8–1.2)
Prothrombin Time: 16 s — ABNORMAL HIGH (ref 11.4–15.2)

## 2020-01-15 SURGERY — EGD (ESOPHAGOGASTRODUODENOSCOPY)
Anesthesia: General

## 2020-01-15 MED ORDER — PROPOFOL 500 MG/50ML IV EMUL
INTRAVENOUS | Status: DC | PRN
  Administered 2020-01-15: 145 ug/kg/min via INTRAVENOUS

## 2020-01-15 MED ORDER — IOHEXOL 350 MG/ML SOLN
100.0000 mL | Freq: Once | INTRAVENOUS | Status: AC | PRN
  Administered 2020-01-15: 100 mL via INTRAVENOUS

## 2020-01-15 MED ORDER — SODIUM CHLORIDE 0.9 % IV SOLN
10.0000 mL/h | Freq: Once | INTRAVENOUS | Status: AC
  Administered 2020-01-15: 10 mL/h via INTRAVENOUS

## 2020-01-15 MED ORDER — GLYCOPYRROLATE 0.2 MG/ML IJ SOLN
INTRAMUSCULAR | Status: DC | PRN
  Administered 2020-01-15: .2 mg via INTRAVENOUS

## 2020-01-15 MED ORDER — ALBUTEROL SULFATE (2.5 MG/3ML) 0.083% IN NEBU: 3.0000 mL | INHALATION_SOLUTION | RESPIRATORY_TRACT | Status: DC | PRN

## 2020-01-15 MED ORDER — SODIUM CHLORIDE 0.9 % IV SOLN: 250.0000 mL | INTRAVENOUS | Status: DC | PRN

## 2020-01-15 MED ORDER — PROPOFOL 10 MG/ML IV BOLUS
INTRAVENOUS | Status: DC | PRN
  Administered 2020-01-15: 10 mg via INTRAVENOUS
  Administered 2020-01-15: 50 mg via INTRAVENOUS

## 2020-01-15 MED ORDER — SODIUM CHLORIDE 0.9% FLUSH: 3.0000 mL | INTRAVENOUS | Status: DC | PRN

## 2020-01-15 MED ORDER — SODIUM CHLORIDE 0.9% FLUSH
3.0000 mL | Freq: Two times a day (BID) | INTRAVENOUS | Status: DC
  Administered 2020-01-15 – 2020-01-21 (×11): 3 mL via INTRAVENOUS

## 2020-01-15 MED ORDER — LIDOCAINE HCL (CARDIAC) PF 100 MG/5ML IV SOSY
PREFILLED_SYRINGE | INTRAVENOUS | Status: DC | PRN
  Administered 2020-01-15: 100 mg via INTRAVENOUS

## 2020-01-15 MED ORDER — SODIUM CHLORIDE 0.9 % IV SOLN: INTRAVENOUS | Status: DC

## 2020-01-15 MED ORDER — PANTOPRAZOLE SODIUM 40 MG IV SOLR: 40.0000 mg | Freq: Two times a day (BID) | INTRAVENOUS | Status: DC

## 2020-01-15 MED ORDER — SODIUM CHLORIDE 0.9 % IV SOLN
8.0000 mg/h | INTRAVENOUS | Status: DC
  Administered 2020-01-15 – 2020-01-16 (×2): 8 mg/h via INTRAVENOUS
  Filled 2020-01-15 (×2): qty 80

## 2020-01-15 MED ORDER — SODIUM CHLORIDE 0.9 % IV SOLN
80.0000 mg | Freq: Once | INTRAVENOUS | Status: DC
  Filled 2020-01-15: qty 80

## 2020-01-15 NOTE — ED Notes (Signed)
Returned from CT.

## 2020-01-15 NOTE — ED Notes (Addendum)
Attempted to contact wife, no answer. Left message. Wife is also deaf.

## 2020-01-15 NOTE — ED Provider Notes (Signed)
[facility] Emergency Department Provider Note  ____________________________________________   First MD Initiated Contact with Patient 01/15/20 [phone]     (approximate)  I have reviewed the triage vital signs and the nursing notes.   HISTORY  Chief Complaint GI Bleeding   HPI Thomas [name] is a 85 y.o. 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  EMS states that there was blood that saturated the bed into the mattress.  Patient does admit to 10 out of 10 right upper quadrant abdominal pain.  Patient denies any fever.  Patient denies any nausea or vomiting.       Past Medical History:  Diagnosis Date  . AICD (automatic cardioverter/defibrillator) present   . Bacteremia due to group B Streptococcus    Recurrent admissions for group B Strepotococcus bacteremia of unknown source with TEEs negative for vegetation 06/2019 and 08/2019  . Biventricular ICD (implantable cardioverter-defibrillator) in place 03/24/2005   Implantation of a Medtronic Adapta ADDRO1, serial number [id number]  . CHF (congestive heart failure) ([place])   . CKD (chronic kidney disease), stage III ([place])   . Coronary artery disease    a. s/p CABG 1986. b. Multiple PCIs/caths. c. 09/2013: s/p PTCA and BMS to SVG-OM.  [name]    Requires sign language interpreter  . Dysrhythmia   . History of abdominal aortic aneurysm   . History of bleeding peptic ulcer 1980  . History of epididymitis 2013  . HTN (hypertension)   . Hydronephrosis with ureteropelvic junction obstruction   . Hydroureter on left 2009  . Hypertension   . Ischemic cardiomyopathy    a. Prior EF 30-35%, s/p BIV-ICD. b. 09/2013: EF 45-50%.  . Moderate tricuspid regurgitation   . PAF (paroxysmal atrial fibrillation) (HCC)    Not on [place] 2/2 GIB   . Presence of permanent cardiac pacemaker 2002   Original placed in 2002 for CHB then 2007 and 2014 device change out  . Prostate cancer ([place])   . Status post coronary artery bypass grafting 1986   LIMA to the LAD, SVG to OM, SVG to RCA  . Testicular swelling     Patient Active Problem List   Diagnosis Date Noted  . 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 [place] intertrigo 08/24/2019  . Edema of left upper extremity 08/24/2019  . Pneumonia of left lower lobe due to group B Streptococcus ([place]) 08/24/2019  . Heart block AV complete ([place]) 08/23/2019  . Sepsis due to group B Streptococcus ([place]) 08/20/2019  . Acute blood loss anemia 08/20/2019  . HCAP (healthcare-associated pneumonia) 08/18/2019  . Severe sepsis with septic shock ([place]) 08/18/2019  . Acute renal failure superimposed on stage 3a chronic kidney disease ([place]) 08/18/2019  . Black stool 08/18/2019  . Iron deficiency anemia 08/18/2019  . Group B streptococcal bacteriuria 06/18/2019  . Lactic acidosis 06/18/2019  . Pressure ulcer, stage 2 ([place]) 06/18/2019  . Bacteremia due to group B Streptococcus 06/17/2019  . SIRS (systemic inflammatory response syndrome) ([place]) 06/16/2019  . Leg swelling   . Decompensated hepatic cirrhosis ([place]) 05/22/2019  . Anasarca 05/19/2019  . CRI (chronic renal insufficiency), stage 3 (moderate) ([place]) 01/30/2019  . Iron deficiency anemia due to chronic blood loss 11/02/2018  . Coronary artery disease of bypass graft of native heart with stable angina pectoris ([place]) 11/02/2018  .  Pure hypercholesterolemia 11/02/2018  . Biventricular automatic implantable cardioverter defibrillator in situ 11/02/2018  . GIB (gastrointestinal bleeding) 09/14/2018  . Hypotension 08/14/2018  . Chronic systolic CHF (congestive heart failure) ([place]) 08/12/2018  . Acute on chronic combined systolic and diastolic CHF (congestive heart failure) ([place])   . GI bleed 05/26/2018  . Occult GI bleeding 05/25/2018  .  Normocytic anemia 05/25/2018  . Elevated troponin 05/25/2018  . Lymphedema 02/28/2018  . Weakness 07/15/2016  . Fatigue 07/15/2016  . Stroke (cerebrum) ([place]) 10/31/2015  . Bulbous urethral stricture 09/18/2015  . Bilateral deafness 08/19/2015  . Bilateral cataracts 08/19/2015  . Acid reflux 08/19/2015  . Mixed hyperlipidemia 08/19/2015  . Essential hypertension 08/19/2015  . Myocardial infarction ([place]) 08/19/2015  . Calculus of kidney 08/19/2015  . Pacemaker 08/19/2015  . Gastroduodenal ulcer 08/19/2015  . Dupuytren's contracture of foot 08/19/2015  . Malignant neoplasm of prostate ([place]) 08/19/2015  . Microhematuria 08/19/2015  . CAD S/P multiple PCIs 02/22/2015  . Status post coronary artery bypass grafting 02/22/2015  . Benign essential HTN 04/02/2014  . Hematochezia 02/20/2014  . Tricuspid valve regurgitation, nonrheumatic 02/20/2014  . Mobitz type II atrioventricular block 12/12/2013  . Long term current use of anticoagulant 10/18/2013  . Cardiomyopathy, ischemic 11/11/2012  . Permanent atrial fibrillation ([place]) 11/11/2012  . Biventricular ICD in place 11/11/2012  . Chronic combined systolic and diastolic heart failure ([place]) 11/11/2012    Past Surgical History:  Procedure Laterality Date  . 2-D echocardiogram  11/20/2011   Ejection fraction 30-35% moderate concentric left ventricular hypertrophy. Left atrium is moderately dilated. Mild MR. Mild or  . BI-VENTRICULAR IMPLANTABLE CARDIOVERTER DEFIBRILLATOR N/A 12/16/2012   Procedure: BI-VENTRICULAR IMPLANTABLE CARDIOVERTER DEFIBRILLATOR  (CRT-D);  Surgeon: [name], MD;  Location: [facility] C[facility] LAB;  Service: Cardiovascular;  Laterality: N/A;  . BIV ICD INSERTION CRT-D N/A 09/01/2019   Procedure: BIV ICD INSERTION CRT-D;  Surgeon: [name], MD;  Location: [facility] CV LAB;  Service: Cardiovascular;  Laterality: N/A;  . CARDIAC CATHETERIZATION  12/10/2011   SVG to OM widely patent.  LIMA to LAD patent  . CATARACT  EXTRACTION W/PHACO Right 10/12/2017   Procedure: CATARACT EXTRACTION PHACO AND INTRAOCULAR LENS PLACEMENT (IOC);  Surgeon: Birder Robson, MD;  Location: [facility] ORS;  Service: Ophthalmology;  Laterality: Right;  [place] 00:57 AP% 15.9 CDE 9.07 Fluid pack lot # [id number]  . COLONOSCOPY N/A 07/13/2018   Procedure: COLONOSCOPY;  Surgeon: Toledo, Benay Pike, MD;  Location: [facility] ENDOSCOPY;  Service: Gastroenterology;  Laterality: N/A;  . CORONARY ARTERY BYPASS GRAFT  1986  . ENTEROSCOPY N/A 09/14/2018   Procedure: ENTEROSCOPY;  Surgeon: Toledo, Benay Pike, MD;  Location: [facility] ENDOSCOPY;  Service: Gastroenterology;  Laterality: N/A;  symptomatic anemia, GI blood loss anemia, melena, positive small bowel capsule endoscopy showing source of bleeding   . ENTEROSCOPY N/A 08/22/2019   Procedure: ENTEROSCOPY;  Surgeon: Lin Landsman, MD;  Location: [facility] ENDOSCOPY;  Service: Gastroenterology;  Laterality: N/A;  . ESOPHAGOGASTRODUODENOSCOPY N/A 07/13/2018   Procedure: ESOPHAGOGASTRODUODENOSCOPY (EGD);  Surgeon: Toledo, Benay Pike, MD;  Location: [facility] ENDOSCOPY;  Service: Gastroenterology;  Laterality: N/A;  . ESOPHAGOGASTRODUODENOSCOPY N/A 09/14/2018   Procedure: ESOPHAGOGASTRODUODENOSCOPY (EGD);  Surgeon: Toledo, Benay Pike, MD;  Location: [facility] ENDOSCOPY;  Service: Gastroenterology;  Laterality: N/A;  SIGN LANAGUAGE INTERPRETER  . ESOPHAGOGASTRODUODENOSCOPY N/A 06/21/2019   Procedure: ESOPHAGOGASTRODUODENOSCOPY (EGD);  Surgeon: Toledo, Benay Pike, MD;  Location: [facility] ENDOSCOPY;  Service: Gastroenterology;  Laterality: N/A;  . ESOPHAGOGASTRODUODENOSCOPY (EGD) WITH PROPOFOL N/A 05/27/2018   Procedure: ESOPHAGOGASTRODUODENOSCOPY (  EGD) WITH PROPOFOL;  Surgeon: Clarene Essex, MD;  Location: [place];  Service: Endoscopy;  Laterality: N/A;  . ICD LEAD REMOVAL Left 08/25/2019   Procedure: ICD LEAD EXTRACTION;  Surgeon: [name], MD;  Location: [place];  Service: Cardiovascular;  Laterality: Left;  DR. Roxan Hockey BACK UP  .  INSERT / REPLACE / REMOVE PACEMAKER    . LEFT HEART CATHETERIZATION WITH CORONARY/GRAFT ANGIOGRAM N/A 12/10/2011   Procedure: LEFT HEART CATHETERIZATION WITH Beatrix Fetters;  Surgeon: Sanda Klein, MD;  Location: [place] CATH LAB;  Service: Cardiovascular;  Laterality: N/A;  . LEFT HEART CATHETERIZATION WITH CORONARY/GRAFT ANGIOGRAM N/A 09/25/2013   Procedure: LEFT HEART CATHETERIZATION WITH Beatrix Fetters;  Surgeon: Blane Ohara, MD;  Location: [facility] CATH LAB;  Service: Cardiovascular;  Laterality: N/A;  . MULTIPLE EXTRACTIONS WITH ALVEOLOPLASTY Bilateral 08/30/2019   Procedure: Extractionof tooth #'s 2, 28, and 31 with alveoloplasty and gross debridement of remaining teeth.;  Surgeon: [name], DDS;  Location: [place];  Service: Oral Surgery;  Laterality: Bilateral;  . Persantine Myoview  05/06/2010   Post-rest ejection fraction 30%. No significant ischemia demonstrated. Compared to previous study there is no significant change.  . TEE WITHOUT CARDIOVERSION N/A 06/22/2019   Procedure: TRANSESOPHAGEAL ECHOCARDIOGRAM (TEE);  Surgeon: Minna Merritts, MD;  Location: [facility] ORS;  Service: Cardiovascular;  Laterality: N/A;  . TEE WITHOUT CARDIOVERSION N/A 08/23/2019   Procedure: TRANSESOPHAGEAL ECHOCARDIOGRAM (TEE);  Surgeon: Kate Sable, MD;  Location: [facility] ORS;  Service: Cardiovascular;  Laterality: N/A;  . TEE WITHOUT CARDIOVERSION N/A 08/25/2019   Procedure: TRANSESOPHAGEAL ECHOCARDIOGRAM (TEE);  Surgeon: [name], MD;  Location: [facility] OR;  Service: Cardiovascular;  Laterality: N/A;  . TRANSURETHRAL RESECTION OF PROSTATE     s/p    Prior to Admission medications   Medication Sig Start Date End Date Taking? Authorizing Provider  acetaminophen (TYLENOL) 325 MG tablet Take 2 tablets (650 mg total) by mouth every 6 (six) hours as needed for mild pain or fever. 08/23/19   Loletha Grayer, MD  albuterol (PROVENTIL) (2.5 MG/3ML) 0.083% nebulizer solution Inhale 3 mLs into  the lungs every 4 (four) hours as needed for wheezing or shortness of breath. 08/23/19   Loletha Grayer, MD  ferrous sulfate 325 (65 FE) MG tablet Take 1 tablet (325 mg total) by mouth 2 (two) times daily with a meal. Patient taking differently: Take 325 mg by mouth daily with breakfast.  09/11/19 12/05/19  Mercy Riding, MD  losartan (COZAAR) 25 MG tablet Take 25 mg by mouth daily.    [provider]  midodrine (PROAMATINE) 5 MG tablet TAKE 1 TABLET (5 MG TOTAL) BY MOUTH 3 (THREE) TIMES DAILY WITH MEALS. 01/08/20   [name], MD  Multiple Vitamin (MULTIVITAMIN WITH MINERALS) TABS tablet Take 1 tablet by mouth daily. 05/29/19   Ezekiel Slocumb, DO  Nystatin (GERHARDT'S BUTT CREAM) CREA Apply 1 application topically 2 (two) times daily. Patient taking differently: Apply 1 application topically as needed.  09/11/19   Mercy Riding, MD  ondansetron (ZOFRAN) 4 MG tablet Take 1 tablet (4 mg total) by mouth every 8 (eight) hours as needed for nausea or vomiting. 09/12/19 09/11/20  Mercy Riding, MD  pantoprazole (PROTONIX) 40 MG tablet Take 1 tablet (40 mg total) by mouth 2 (two) times daily for 14 days, THEN 1 tablet (40 mg total) daily. Patient not taking: Reported on 12/05/2019 09/12/19 12/11/19  Mercy Riding, MD  simvastatin (ZOCOR) 20 MG tablet TAKE 1  TABLET BY MOUTH EVERY DAY 04/07/19   Erlene Quan, PA-C  spironolactone (ALDACTONE) 25 MG tablet Take 1 tablet (25 mg total) by mouth daily. 08/01/19 12/05/19  Alisa Graff, FNP  torsemide (DEMADEX) 20 MG tablet Take 2 tablets (40 mg total) by mouth 2 (two) times daily. 09/11/19   Mercy Riding, MD    Allergies Entresto [sacubitril-valsartan], Phenazopyridine, and Ramipril  Family History  Problem Relation Age of Onset  . Hypertension Father     Social History Social History   Tobacco Use  . Smoking status: Former Smoker    Quit date: 03/15/1985    Years since quitting: 34.8  . Smokeless tobacco: Never Used  Vaping Use  .  Vaping Use: Never used  Substance Use Topics  . Alcohol use: No    Comment: occas.  . Drug use: No    Review of Systems Constitutional: No fever/chills Eyes: No visual changes. ENT: No sore throat. Cardiovascular: Denies chest pain. Respiratory: Denies shortness of breath. Gastrointestinal: Positive for abdominal pain, positive for dark red blood per rectum. Genitourinary: Negative for dysuria. Musculoskeletal: Negative for neck pain.  Negative for back pain. Integumentary: Negative for rash. Neurological: Negative for headaches, focal weakness or numbness.  ____________________________________________   PHYSICAL EXAM:  VITAL SIGNS: ED Triage Vitals  Enc Vitals Group     BP 01/15/20 0252 107/68     Pulse Rate 01/15/20 0252 79     Resp 01/15/20 0252 16     Temp 01/15/20 0252 97.9 F (36.6 C)     Temp Source 01/15/20 0252 Oral     SpO2 01/15/20 0248 98 %     Weight 01/15/20 0306 81.7 kg (180 lb 1.9 oz)     Height 01/15/20 0306 1.88 m (6\' 2" )     Head Circumference --      Peak Flow --      Pain Score 01/15/20 0253 10     Pain Loc --      Pain Edu? --      Excl. in [place]? --     Constitutional: Alert and oriented.  Eyes: Pale conjunctiva Head: Atraumatic. Mouth/Throat: Patient is wearing a mask. Neck: No stridor.  No meningeal signs.   Cardiovascular: Normal rate, regular rhythm. Good peripheral circulation. Grossly normal heart sounds. Respiratory: Normal respiratory effort.  No retractions. Gastrointestinal: Right upper quadrant tenderness to palpation.. No distention.  Musculoskeletal: No lower extremity tenderness nor edema. No gross deformities of extremities. Neurologic:  Normal speech and language. No gross focal neurologic deficits are appreciated.  Skin:  Skin is warm, dry and intact. Psychiatric: Mood and affect are normal. Speech and behavior are normal.  ____________________________________________   LABS (all labs ordered are listed, but only  abnormal results are displayed)  Labs Reviewed  COMPREHENSIVE METABOLIC PANEL - Abnormal; Notable for the following components:      Result Value   Potassium 5.3 (*)    Glucose, Bld 117 (*)    BUN 31 (*)    Calcium 8.3 (*)    Total Protein 5.1 (*)    Albumin 2.2 (*)    Anion gap 4 (*)    All other components within normal limits  PROTIME-INR - Abnormal; Notable for the following components:   Prothrombin Time 16.0 (*)    INR 1.3 (*)    All other components within normal limits  CBC WITH DIFFERENTIAL/PLATELET - Abnormal; Notable for the following components:   RBC 2.46 (*)    Hemoglobin 6.2 (*)  HCT 20.6 (*)    MCH 25.2 (*)    RDW 20.9 (*)    All other components within normal limits  RESPIRATORY PANEL BY RT PCR (FLU A&B, COVID)  CBC WITH DIFFERENTIAL/PLATELET  TYPE AND SCREEN  PREPARE RBC (CROSSMATCH)     RADIOLOGY I, [place] Sharad Vane[place], personally viewed and evaluated these images (plain radiographs) as part of my medical decision making, as well as reviewing the written report by the radiologist.  ED MD interpretation: No emergent findings noted on CT angiogram abdomen pelvis radiologist on the vascular impression.  Inflammation of the pancreaticoduodenal groove most likely secondary to peptic ulcer disease per radiologist.  Official radiology report(s): CT Angio Abd/Pel w/ and/or w/o  Result Date: 01/15/2020 CLINICAL DATA:  Acute nonlocalized abdominal pain.  GI bleeding EXAM: CTA ABDOMEN AND PELVIS WITHOUT AND WITH CONTRAST TECHNIQUE: Multidetector CT imaging of the abdomen and pelvis was performed using the standard protocol during bolus administration of intravenous contrast. Multiplanar reconstructed images and MIPs were obtained and reviewed to evaluate the vascular anatomy. CONTRAST:  136mL OMNIPAQUE IOHEXOL 350 MG/ML SOLN COMPARISON:  06/17/2019 abdomen and pelvis CT without contrast FINDINGS: VASCULAR Aorta: Stent grafting of the aorta and iliacs. No high-density  hematoma seen within the wall and noncontrast phase. Well-opposed stent graft to the infrarenal aorta. No adjacent fat stranding seen. Celiac: Heavily calcified branches from atherosclerosis. No acute finding. Proximal narrowing is approximately 50%. SMA: Diffuse atheromatous plaque. No branch occlusion or dissection seen. Renals: Accessory upper pole renal artery on the left. The renal arteries are heavily calcified. No aneurysm or occlusion seen. No detected beading. IMA: Poorly enhancing, being covered by the stent graft Inflow: Heavily calcified on both sides. 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 No significant change from prior. No superimposed dissection. Proximal Outflow: Diffuse atheromatous calcification Veins: No portal or systemic venous occlusion is seen. 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 Review of the MIP images confirms the above findings. NON-VASCULAR Lower chest: At least moderate bilateral pleural effusion with atelectasis. The heart is enlarged and there is a biventricular pacer. Hepatobiliary: 2.8 cm cystic density mass in the left lobe liver. No visible gallbladder inflammation or calcified stone Pancreas: Fat stranding and expansion around the pancreatic head. Spleen: Granulomatous calcifications Adrenals/Urinary Tract: Negative adrenals. Patchy right renal cortical scarring which is extensive. Renal hilar calcifications are atheromatous. Unremarkable bladder. Stomach/Bowel: No intraluminal hemorrhage is seen when accounting for intrinsic high-density bowel contents at the distal colon. Inflammation around the mid duodenum. There are a few colonic diverticula Lymphatic: Negative for adenopathy or mass Reproductive: Enlarged prostate, asymmetric to the left, unchanged. Fiducial markers are present in the prostate. Other: No ascites or pneumoperitoneum Musculoskeletal: Degenerative disease without acute finding IMPRESSION:  VASCULAR 1. No emergent finding. 2. Aortoiliac stent grafting. 3.  Aortic Atherosclerosis (ICD10-I70.0). NON-VASCULAR 1. Inflammation at the pancreaticoduodenal groove which could be from pancreatitis or peptic ulcer disease, please correlate with labs. 2. Chronic bilateral pleural effusion with atelectasis. 3. 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 4. Extensive right renal cortical scarring. Electronically Signed   By: Monte Fantasia M.D.   On: 01/15/2020 05:00    _______________________________________  .Critical Care Performed by: Gregor Hams, MD Authorized by: Gregor Hams, MD   Critical care provider statement:    Critical care time (minutes):  30   Critical care time was exclusive of:  Separately billable procedures and treating other patients (GI bleed requiring blood transfusion)  Critical care was time spent personally by me on the following activities:  Development of treatment plan with patient or surrogate, discussions with consultants, evaluation of patient's response to treatment, examination of patient, obtaining history from patient or surrogate, ordering and performing treatments and interventions, ordering and review of laboratory studies, ordering and review of radiographic studies, pulse oximetry, re-evaluation of patient's condition and review of old charts     ____________________________________________   [place] / MDM / [place] / ED COURSE  As part of my medical decision making, I reviewed the following data within the electronic MEDICAL RECORD NUMBER   85 year old male presented with above-stated history and physical exam secondary to GI bleed.  Patient hemodynamically stable on arrival not tachycardic, normotensive.  However given volume of blood reported by EMS and patient's clinical status with markedly pale conjunctiva patient typed and crossed.  Laboratory data revealed hemoglobin of 6.2 most recent hemoglobin  from 12/05/2019 was 8.9.  2 units packed red blood cells ordered for transfusion after obtaining consent from the patient.  Patient subsequently discussed with Dr. Vicente Males Gastroenterologist.  Patient will be admitted to the hospitalist after further evaluation and management. ____________________________________________  FINAL CLINICAL IMPRESSION(S) / ED DIAGNOSES  Final diagnoses:  Gastrointestinal hemorrhage, unspecified gastrointestinal hemorrhage type     MEDICATIONS GIVEN DURING THIS VISIT:  Medications  0.9 %  sodium chloride infusion (has no administration in time range)  iohexol (OMNIPAQUE) 350 MG/ML injection 100 mL (100 mLs Intravenous Contrast Given 01/15/20 0411)     ED Discharge Orders    None      *Please note:  Thomas [name] was evaluated in Emergency Department on 01/15/2020 for the symptoms described in the history of present illness. He was evaluated in the context of the global COVID-19 pandemic, which necessitated consideration that the patient might be at risk for infection with the SARS-CoV-2 virus that causes COVID-19. Institutional protocols and algorithms that pertain to the evaluation of patients at risk for COVID-19 are in a state of rapid change based on information released by regulatory bodies including the CDC and federal and state organizations. These policies and algorithms were followed during the patient's care in the ED.  Some ED evaluations and interventions may be delayed as a result of limited staffing during and after the pandemic.*  Note:  This document was prepared using Dragon voice recognition software and may include unintentional dictation errors.   Gregor Hams, MD 01/15/20 [phone]

## 2020-01-15 NOTE — ED Notes (Signed)
This RN attempted to call report. Receiving RN hasn't had time to look at chart. Patient will go to floor in 15 min and this RN will call and see if RN has any questions.

## 2020-01-15 NOTE — Anesthesia Postprocedure Evaluation (Signed)
Anesthesia Post Note  Patient: Thomas [name]  Procedure(s) Performed: ESOPHAGOGASTRODUODENOSCOPY (EGD) (N/A )  Patient location during evaluation: Endoscopy Anesthesia Type: General Level of consciousness: awake and alert Pain management: pain level controlled Vital Signs Assessment: post-procedure vital signs reviewed and stable Respiratory status: spontaneous breathing, nonlabored ventilation, respiratory function stable and patient connected to nasal cannula oxygen Cardiovascular status: blood pressure returned to baseline and stable Postop Assessment: no apparent nausea or vomiting Anesthetic complications: no   No complications documented.   Last Vitals:  Vitals:   01/15/20 1627 01/15/20 1637  BP: (!) 97/53 107/61  Pulse: 70 70  Resp: 17 (!) 31  Temp:    SpO2: 98% 95%    Last Pain:  Vitals:   01/15/20 1637  TempSrc:   PainSc: 0-No pain                 Precious Haws Maya Scholer

## 2020-01-15 NOTE — ED Notes (Signed)
Pt tolerating blood at this time, rate increased to 116ml/hr

## 2020-01-15 NOTE — Consult Note (Signed)
Lucilla Lame, MD [facility]  [address], [name], [place] [address] Phone: [phone] Fax : [phone]  Consultation  Referring Provider:     Dr. Francine Graven Primary Care Physician:  [name], MD Primary Gastroenterologist:  Dr. Alice Reichert         Reason for Consultation:     GI bleeding  Date of Admission:  01/15/2020 Date of Consultation:  01/15/2020         HPI:   Thomas [name] is a 85 y.o. male with a history of recurrent GI bleeding and he has had multiple EGDs with AVMs found.  The patient has had these treated with cautery.  Patient is hearing impaired and the history is obtained through a Optometrist.  The patient has had a history of CHF.  The patient states that his bleeding started 4 days ago with bleeding on the first day of the second day then no bleeding yesterday then again had bleeding today.  He states that his stools are black when it started and now he had a large amount of dark maroon stools.  There was report by the hospitalist that the patient had some hematemesis at home. Although the patient had told the ER doctor that he had 10 out of 10 pain he denied any abdominal pain with me today.  He denies alcohol abuse and also denies taking any anti-inflammatory medications. The patient had small bowel enteroscopy by Dr. [name] in June of this year that did not show any sign of bleeding or abnormalities. 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  There was also a 2.8 cm cystic density in the left lobe of the liver.  The patient's elastography in March mild nodular contour with liver lesions and an MRI was recommended.  Past Medical History:  Diagnosis Date  . AICD (automatic  cardioverter/defibrillator) present   . Bacteremia due to group B Streptococcus    Recurrent admissions for group B Strepotococcus bacteremia of unknown source with TEEs negative for vegetation 06/2019 and 08/2019  . Biventricular ICD (implantable cardioverter-defibrillator) in place 03/24/2005   Implantation of a Medtronic Adapta ADDRO1, serial number [id number]  . CHF (congestive heart failure) ([place])   . CKD (chronic kidney disease), stage III ([place])   . Coronary artery disease    a. s/p CABG 1986. b. Multiple PCIs/caths. c. 09/2013: s/p PTCA and BMS to SVG-OM.  [name]    Requires sign language interpreter  . Dysrhythmia   . History of abdominal aortic aneurysm   . History of bleeding peptic ulcer 1980  . History of epididymitis 2013  . HTN (hypertension)   . Hydronephrosis with ureteropelvic junction obstruction   . Hydroureter on left 2009  . Hypertension   . Ischemic cardiomyopathy    a. Prior EF 30-35%, s/p BIV-ICD. b. 09/2013: EF 45-50%.  . Moderate tricuspid regurgitation   . PAF (paroxysmal atrial fibrillation) (HCC)    Not on [place] 2/2 GIB  . Presence of permanent cardiac pacemaker 2002   Original placed in 2002 for CHB then 2007 and 2014 device change out  . Prostate cancer ([place])   . Status post coronary artery bypass grafting 1986   LIMA to the LAD, SVG to OM, SVG to RCA  .  Testicular swelling     Past Surgical History:  Procedure Laterality Date  . 2-D echocardiogram  11/20/2011   Ejection fraction 30-35% moderate concentric left ventricular hypertrophy. Left atrium is moderately dilated. Mild MR. Mild or  . BI-VENTRICULAR IMPLANTABLE CARDIOVERTER DEFIBRILLATOR N/A 12/16/2012   Procedure: BI-VENTRICULAR IMPLANTABLE CARDIOVERTER DEFIBRILLATOR  (CRT-D);  Surgeon: [name], MD;  Location: [facility] CATH LAB;  Service: Cardiovascular;  Laterality: N/A;  . BIV ICD INSERTION CRT-D N/A 09/01/2019   Procedure: BIV ICD INSERTION CRT-D;  Surgeon: [name], MD;  Location: [place] CV LAB;  Service: Cardiovascular;  Laterality: N/A;  . CARDIAC CATHETERIZATION  12/10/2011   SVG to OM widely patent.  LIMA to LAD patent  . CATARACT EXTRACTION W/PHACO Right 10/12/2017   Procedure: CATARACT EXTRACTION PHACO AND INTRAOCULAR LENS PLACEMENT (IOC);  Surgeon: Birder Robson, MD;  Location: [facility] ORS;  Service: Ophthalmology;  Laterality: Right;  [place] 00:57 AP% 15.9 CDE 9.07 Fluid pack lot # [id number]  . COLONOSCOPY N/A 07/13/2018   Procedure: COLONOSCOPY;  Surgeon: Toledo, Benay Pike, MD;  Location: [facility] ENDOSCOPY;  Service: Gastroenterology;  Laterality: N/A;  . CORONARY ARTERY BYPASS GRAFT  1986  . ENTEROSCOPY N/A 09/14/2018   Procedure: ENTEROSCOPY;  Surgeon: Toledo, Benay Pike, MD;  Location: [facility] ENDOSCOPY;  Service: Gastroenterology;  Laterality: N/A;  symptomatic anemia, GI blood loss anemia, melena, positive small bowel capsule endoscopy showing source of bleeding   . ENTEROSCOPY N/A 08/22/2019   Procedure: ENTEROSCOPY;  Surgeon: Lin Landsman, MD;  Location: [facility] ENDOSCOPY;  Service: Gastroenterology;  Laterality: N/A;  . ESOPHAGOGASTRODUODENOSCOPY N/A 07/13/2018   Procedure: ESOPHAGOGASTRODUODENOSCOPY (EGD);  Surgeon: Toledo, Benay Pike, MD;  Location: [facility] ENDOSCOPY;  Service: Gastroenterology;  Laterality: N/A;  . ESOPHAGOGASTRODUODENOSCOPY N/A 09/14/2018   Procedure: ESOPHAGOGASTRODUODENOSCOPY (EGD);  Surgeon: Toledo, Benay Pike, MD;  Location: [facility] ENDOSCOPY;  Service: Gastroenterology;  Laterality: N/A;  SIGN LANAGUAGE INTERPRETER  . ESOPHAGOGASTRODUODENOSCOPY N/A 06/21/2019   Procedure: ESOPHAGOGASTRODUODENOSCOPY (EGD);  Surgeon: Toledo, Benay Pike, MD;  Location: [facility] ENDOSCOPY;  Service: Gastroenterology;  Laterality: N/A;  . ESOPHAGOGASTRODUODENOSCOPY (EGD) WITH PROPOFOL N/A 05/27/2018   Procedure: ESOPHAGOGASTRODUODENOSCOPY (EGD) WITH PROPOFOL;  Surgeon: Clarene Essex, MD;  Location: [facility];  Service: Endoscopy;  Laterality: N/A;  . ICD LEAD REMOVAL Left  08/25/2019   Procedure: ICD LEAD EXTRACTION;  Surgeon: [name], MD;  Location: [place];  Service: Cardiovascular;  Laterality: Left;  DR. Roxan Hockey BACK UP  . INSERT / REPLACE / REMOVE PACEMAKER    . LEFT HEART CATHETERIZATION WITH CORONARY/GRAFT ANGIOGRAM N/A 12/10/2011   Procedure: LEFT HEART CATHETERIZATION WITH Beatrix Fetters;  Surgeon: Sanda Klein, MD;  Location: [facility] CATH LAB;  Service: Cardiovascular;  Laterality: N/A;  . LEFT HEART CATHETERIZATION WITH CORONARY/GRAFT ANGIOGRAM N/A 09/25/2013   Procedure: LEFT HEART CATHETERIZATION WITH Beatrix Fetters;  Surgeon: Blane Ohara, MD;  Location: [facility] CATH LAB;  Service: Cardiovascular;  Laterality: N/A;  . MULTIPLE EXTRACTIONS WITH ALVEOLOPLASTY Bilateral 08/30/2019   Procedure: Extractionof tooth #'s 2, 28, and 31 with alveoloplasty and gross debridement of remaining teeth.;  Surgeon: [name], DDS;  Location: [facility];  Service: Oral Surgery;  Laterality: Bilateral;  . Persantine Myoview  05/06/2010   Post-rest ejection fraction 30%. No significant ischemia demonstrated. Compared to previous study there is no significant change.  . TEE WITHOUT CARDIOVERSION N/A 06/22/2019   Procedure: TRANSESOPHAGEAL ECHOCARDIOGRAM (TEE);  Surgeon: Minna Merritts, MD;  Location: [facility] ORS;  Service: Cardiovascular;  Laterality: N/A;  . TEE WITHOUT CARDIOVERSION  N/A 08/23/2019   Procedure: TRANSESOPHAGEAL ECHOCARDIOGRAM (TEE);  Surgeon: Kate Sable, MD;  Location: [facility] ORS;  Service: Cardiovascular;  Laterality: N/A;  . TEE WITHOUT CARDIOVERSION N/A 08/25/2019   Procedure: TRANSESOPHAGEAL ECHOCARDIOGRAM (TEE);  Surgeon: [name], MD;  Location: [facility] OR;  Service: Cardiovascular;  Laterality: N/A;  . TRANSURETHRAL RESECTION OF PROSTATE     s/p    Prior to Admission medications   Medication Sig Start Date End Date Taking? Authorizing Provider  acetaminophen (TYLENOL) 325 MG tablet Take 2 tablets (650 mg total) by  mouth every 6 (six) hours as needed for mild pain or fever. 08/23/19   Loletha Grayer, MD  albuterol (PROVENTIL) (2.5 MG/3ML) 0.083% nebulizer solution Inhale 3 mLs into the lungs every 4 (four) hours as needed for wheezing or shortness of breath. 08/23/19   Loletha Grayer, MD  ferrous sulfate 325 (65 FE) MG tablet Take 1 tablet (325 mg total) by mouth 2 (two) times daily with a meal. Patient taking differently: Take 325 mg by mouth daily with breakfast.  09/11/19 12/05/19  Mercy Riding, MD  losartan (COZAAR) 25 MG tablet Take 25 mg by mouth daily.    [provider]  midodrine (PROAMATINE) 5 MG tablet TAKE 1 TABLET (5 MG TOTAL) BY MOUTH 3 (THREE) TIMES DAILY WITH MEALS. 01/08/20   [name], MD  Multiple Vitamin (MULTIVITAMIN WITH MINERALS) TABS tablet Take 1 tablet by mouth daily. 05/29/19   Ezekiel Slocumb, DO  Nystatin (GERHARDT'S BUTT CREAM) CREA Apply 1 application topically 2 (two) times daily. Patient taking differently: Apply 1 application topically as needed.  09/11/19   Mercy Riding, MD  ondansetron (ZOFRAN) 4 MG tablet Take 1 tablet (4 mg total) by mouth every 8 (eight) hours as needed for nausea or vomiting. 09/12/19 09/11/20  Mercy Riding, MD  pantoprazole (PROTONIX) 40 MG tablet Take 1 tablet (40 mg total) by mouth 2 (two) times daily for 14 days, THEN 1 tablet (40 mg total) daily. Patient not taking: Reported on 12/05/2019 09/12/19 12/11/19  Mercy Riding, MD  simvastatin (ZOCOR) 20 MG tablet TAKE 1 TABLET BY MOUTH EVERY DAY 04/07/19   Erlene Quan, PA-C  spironolactone (ALDACTONE) 25 MG tablet Take 1 tablet (25 mg total) by mouth daily. 08/01/19 12/05/19  Alisa Graff, FNP  torsemide (DEMADEX) 20 MG tablet Take 2 tablets (40 mg total) by mouth 2 (two) times daily. 09/11/19   Mercy Riding, MD    Family History  Problem Relation Age of Onset  . Hypertension Father      Social History   Tobacco Use  . Smoking status: Former Smoker    Quit date: 03/15/1985     Years since quitting: 34.8  . Smokeless tobacco: Never Used  Vaping Use  . Vaping Use: Never used  Substance Use Topics  . Alcohol use: No    Comment: occas.  . Drug use: No    Allergies as of 01/15/2020 - Review Complete 01/15/2020  Allergen Reaction Noted  . Entresto [sacubitril-valsartan] Swelling 11/09/2018  . Phenazopyridine Nausea Only and Other (See Comments) 10/05/2013  . Ramipril Other (See Comments) 05/07/2014    Review of Systems:    All systems reviewed and negative except where noted in HPI.   Physical Exam:  Vital signs in last 24 hours: Temp:  [97.7 F (36.5 C)-97.9 F (36.6 C)] 97.7 F (36.5 C) (11/01 0919) Pulse Rate:  [69-79] 73 (11/01 1045) Resp:  [14-26] 24 (11/01 1345) BP: (  106-127)/(55-88) 110/61 (11/01 1345) SpO2:  [96 %-100 %] 100 % (11/01 1045) Weight:  [81.7 kg] 81.7 kg (11/01 0306)   General:   Pleasant, cooperative in NAD Head:  Normocephalic and atraumatic. Eyes:   No icterus.   Conjunctiva pink. PERRLA. Ears:  Normal auditory acuity. Neck:  Supple; no masses or thyroidomegaly Lungs: Respirations even and unlabored. Lungs clear to auscultation bilaterally.   No wheezes, crackles, or rhonchi.  Heart:  Regular rate and rhythm;  Without murmur, clicks, rubs or gallops Abdomen:  Soft, nondistended, nontender. Normal bowel sounds. No appreciable masses or hepatomegaly.  No rebound or guarding.  Rectal:  Not performed. Msk:  Symmetrical without gross deformities.    Extremities:  Without edema, cyanosis or clubbing. Neurologic:  Alert and oriented x3;  grossly normal neurologically. Skin:  Intact without significant lesions or rashes. Cervical Nodes:  No significant cervical adenopathy. Psych:  Alert and cooperative. Normal affect.  LAB RESULTS: Recent Labs    01/15/20 0256  WBC 6.7  HGB 6.2*  HCT 20.6*  PLT 244   BMET Recent Labs    01/15/20 0256  NA 141  K 5.3*  CL 111  CO2 26  GLUCOSE 117*  BUN 31*  CREATININE 1.17  CALCIUM  8.3*   LFT Recent Labs    01/15/20 0256  PROT 5.1*  ALBUMIN 2.2*  AST 18  ALT 11  ALKPHOS 121  BILITOT 0.6   PT/INR Recent Labs    01/15/20 0301  LABPROT 16.0*  INR 1.3*    STUDIES: CT Angio Abd/Pel w/ and/or w/o  Result Date: 01/15/2020 CLINICAL DATA:  Acute nonlocalized abdominal pain.  GI bleeding EXAM: CTA ABDOMEN AND PELVIS WITHOUT AND WITH CONTRAST TECHNIQUE: Multidetector CT imaging of the abdomen and pelvis was performed using the standard protocol during bolus administration of intravenous contrast. Multiplanar reconstructed images and MIPs were obtained and reviewed to evaluate the vascular anatomy. CONTRAST:  125mL OMNIPAQUE IOHEXOL 350 MG/ML SOLN COMPARISON:  06/17/2019 abdomen and pelvis CT without contrast FINDINGS: VASCULAR Aorta: Stent grafting of the aorta and iliacs. No high-density hematoma seen within the wall and noncontrast phase. Well-opposed stent graft to the infrarenal aorta. No adjacent fat stranding seen. Celiac: Heavily calcified branches from atherosclerosis. No acute finding. Proximal narrowing is approximately 50%. SMA: Diffuse atheromatous plaque. No branch occlusion or dissection seen. Renals: Accessory upper pole renal artery on the left. The renal arteries are heavily calcified. No aneurysm or occlusion seen. No detected beading. IMA: Poorly enhancing, being covered by the stent graft Inflow: Heavily calcified on both sides. 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 No significant change from prior. No superimposed dissection. Proximal Outflow: Diffuse atheromatous calcification Veins: No [name] or systemic venous occlusion is seen. 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 Review of the MIP images confirms the above findings. NON-VASCULAR Lower chest: At least moderate bilateral pleural effusion with atelectasis. The heart is enlarged and there is a biventricular pacer. Hepatobiliary: 2.8 cm cystic  density mass in the left lobe liver. No visible gallbladder inflammation or calcified stone Pancreas: Fat stranding and expansion around the pancreatic head. Spleen: Granulomatous calcifications Adrenals/Urinary Tract: Negative adrenals. Patchy right renal cortical scarring which is extensive. Renal hilar calcifications are atheromatous. Unremarkable bladder. Stomach/Bowel: No intraluminal hemorrhage is seen when accounting for intrinsic high-density bowel contents at the distal colon. Inflammation around the mid duodenum. There are a few colonic diverticula Lymphatic: Negative for adenopathy or mass Reproductive: Enlarged prostate, asymmetric  to the left, unchanged. Fiducial markers are present in the prostate. Other: No ascites or pneumoperitoneum Musculoskeletal: Degenerative disease without acute finding IMPRESSION: VASCULAR 1. No emergent finding. 2. Aortoiliac stent grafting. 3.  Aortic Atherosclerosis (ICD10-I70.0). NON-VASCULAR 1. Inflammation at the pancreaticoduodenal groove which could be from pancreatitis or peptic ulcer disease, please correlate with labs. 2. Chronic bilateral pleural effusion with atelectasis. 3. 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 4. Extensive right renal cortical scarring. Electronically Signed   By: Monte Fantasia M.D.   On: 01/15/2020 05:00      Impression / Plan:   Assessment: Principal Problem:   GI bleed Active Problems:   Permanent atrial fibrillation (HCC)   Benign essential HTN   CAD S/P multiple PCIs   Bilateral deafness   CRI (chronic renal insufficiency), stage 3 (moderate) (HCC)   Acute blood loss anemia   Duodenal arteriovenous malformation   Chronic diastolic CHF (congestive heart failure) (HCC)   Thomas [name] is a 86 y.o. y/o male with dark stools and hemoglobin of 6.2 down from 1 month ago of 8.9.  The patient reported his bleeding to have started 4 days ago and he had a large dark and bloody bowel movement today.  The  patient was transfused blood since admission.  He denies any abdominal pain nausea vomiting fevers or chills at this time.  Plan:  The patient will be set up for a EGD to look for the source of his bleeding and to look for any further signs of AVMs that may be causing him to have his black/bloody stools.  The patient had a colonoscopy back in April 2020 without any source of bleeding seen at that time.  The patient has been explained the plan through an interpreter for sign language and agrees with it.  Thank you for involving me in the care of this patient.      LOS: 0 days   Lucilla Lame, MD, [facility] 01/15/2020, 3:23 PM,  Pager [phone] 7am-5pm  Check AMION for 5pm -7am coverage and on weekends   Note: This dictation was prepared with Dragon dictation along with smaller phrase technology. Any transcriptional errors that result from this process are unintentional.

## 2020-01-15 NOTE — ED Triage Notes (Addendum)
Pt arrived via [facility] from home with reports of GI bleed, per EMS pt has dark red blood that is stained through clothes.  Pt is deaf and can read lips. Pt does appear pale on arrival.   AICD present on R chest.  Pt also c/o RUQ pain and is tender to the touch.

## 2020-01-15 NOTE — H&P (Addendum)
History and Physical    LANIS [name] GNF:[id number] DOB: 03/26/1934 DOA: 01/15/2020  PCP: [name], MD   Patient coming from: Home  I have personally briefly reviewed patient's old medical records in [place]  Chief Complaint: Bleeding History is limited due to patient's history of deafness  HPI: Thomas [name] is a 85 y.o. male with medical history significant for impaired hearing, CAD, high-grade AVB s/p BiV-ICD, systolic CHF with recovered EF, CKD-3A, A. fib not on AC due to GI bleed/duodenal AVM, lymphedema, peptic ulcer disease, prostate cancer and history of group B strep bacteremia s/p 6 weeks of antibiotic therapy and status post extraction of BiV-ICD system due to recurrent bacteremia. He had right-sided BiV-ICD insertion CRT-D on 6/18. He also underwent dental extraction involving tooth #2, 28 and 31 with 2 quadrants of alveoloplasty.  Patient was brought into the emergency room by EMS for evaluation of bleeding and was noted to have dark red blood stains on his clothes and lower extremities.  Patient states that he has a history of GI bleed and that he has had emesis containing blood at home as well as dark blood per rectum.  He also complains of pain in his right upper quadrant but denies having any fever or chills.  He complains of weakness and fatigue but denies having any chest pain or shortness of breath. He admits to occasional NSAID use for low back pain but states that he takes it with food. Labs reveal sodium 141, potassium 5.3, chloride 111, bicarb 26, glucose 117, BUN 31, creatinine 1.17, calcium 8.3, alkaline phosphatase 121, albumin 2.2, AST 18, ALT 11, white count 6.7, hemoglobin 6.2, hematocrit 20.6, MCV 83.7, RDW 20.9, PT 16, INR 1.3 Respiratory viral panel is negative CT scan of abdomen and pelvis shows  Inflammation at the pancreaticoduodenal groove which could be from pancreatitis or peptic ulcer disease, please correlate with Labs. Chronic  bilateral pleural effusion with atelectasis. 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 Extensive right renal cortical scarring.    ED Course: Patient is an 85 year old Caucasian male who presents to the emergency room for evaluation of weakness and fatigue as well as hematemesis and passage of melena stools.  Patient was noted to have a hemoglobin of 6.2 g per DL and blood transfusion was started in the emergency room.  He has a known history of peptic ulcer disease as well as bleeding AVMs and will be admitted to the hospital for further evaluation.  Review of Systems: As per HPI otherwise 10 point review of systems negative.    Past Medical History:  Diagnosis Date  . AICD (automatic cardioverter/defibrillator) present   . Bacteremia due to group B Streptococcus    Recurrent admissions for group B Strepotococcus bacteremia of unknown source with TEEs negative for vegetation 06/2019 and 08/2019  . Biventricular ICD (implantable cardioverter-defibrillator) in place 03/24/2005   Implantation of a Med[employer]onic Adapta ADDRO1, serial number [id number]  . CHF (congestive heart failure) ([place])   . CKD (chronic kidney disease), stage III ([place])   . Coronary artery disease    a. s/p CABG 1986. b. Multiple PCIs/caths. c. 09/2013: s/p PTCA and BMS to SVG-OM.  [name]    Requires sign language interpreter  . Dysrhythmia   . History of abdominal aortic aneurysm   . History of bleeding peptic ulcer 1980  . History of epididymitis 2013  . HTN (hypertension)   . Hydronephrosis with ureteropelvic junction obstruction   . Hydroureter on left 2009  .  Hypertension   . Ischemic cardiomyopathy    a. Prior EF 30-35%, s/p BIV-ICD. b. 09/2013: EF 45-50%.  . Moderate tricuspid regurgitation   . PAF (paroxysmal atrial fibrillation) (HCC)    Not on [place] 2/2 GIB  . Presence of permanent cardiac pacemaker 2002   Original placed in 2002 for CHB then 2007 and 2014 device change out  . Prostate cancer  ([place])   . Status post coronary artery bypass grafting 1986   LIMA to the LAD, SVG to OM, SVG to RCA  . Testicular swelling     Past Surgical History:  Procedure Laterality Date  . 2-D echocardiogram  11/20/2011   Ejection fraction 30-35% moderate concentric left ventricular hypertrophy. Left atrium is moderately dilated. Mild MR. Mild or  . BI-VENTRICULAR IMPLANTABLE CARDIOVERTER DEFIBRILLATOR N/A 12/16/2012   Procedure: BI-VENTRICULAR IMPLANTABLE CARDIOVERTER DEFIBRILLATOR  (CRT-D);  Surgeon: [name], MD;  Location: [facility] CATH LAB;  Service: Cardiovascular;  Laterality: N/A;  . BIV ICD INSERTION CRT-D N/A 09/01/2019   Procedure: BIV ICD INSERTION CRT-D;  Surgeon: [name], MD;  Location: [place] CV LAB;  Service: Cardiovascular;  Laterality: N/A;  . CARDIAC CATHETERIZATION  12/10/2011   SVG to OM widely patent.  LIMA to LAD patent  . CATARACT EXTRACTION W/PHACO Right 10/12/2017   Procedure: CATARACT EXTRACTION PHACO AND INTRAOCULAR LENS PLACEMENT (IOC);  Surgeon: Birder Robson, MD;  Location: [facility] ORS;  Service: Ophthalmology;  Laterality: Right;  [place] 00:57 AP% 15.9 CDE 9.07 Fluid pack lot # [id number]  . COLONOSCOPY N/A 07/13/2018   Procedure: COLONOSCOPY;  Surgeon: Toledo, Benay Pike, MD;  Location: [facility] ENDOSCOPY;  Service: Gastroenterology;  Laterality: N/A;  . CORONARY ARTERY BYPASS GRAFT  1986  . ENTEROSCOPY N/A 09/14/2018   Procedure: ENTEROSCOPY;  Surgeon: Toledo, Benay Pike, MD;  Location: [facility] ENDOSCOPY;  Service: Gastroenterology;  Laterality: N/A;  symptomatic anemia, GI blood loss anemia, melena, positive small bowel capsule endoscopy showing source of bleeding   . ENTEROSCOPY N/A 08/22/2019   Procedure: ENTEROSCOPY;  Surgeon: Lin Landsman, MD;  [facility] ENDOSCOPY;  Service: Gastroenterology;  Laterality: N/A;  . ESOPHAGOGASTRODUODENOSCOPY N/A 07/13/2018   Procedure: ESOPHAGOGASTRODUODENOSCOPY (EGD);  Surgeon: Toledo, Benay Pike, MD;  Location: [facility]  ENDOSCOPY;  Service: Gastroenterology;  Laterality: N/A;  . ESOPHAGOGASTRODUODENOSCOPY N/A 09/14/2018   Procedure: ESOPHAGOGASTRODUODENOSCOPY (EGD);  Surgeon: Toledo, Benay Pike, MD;  Location: [facility] ENDOSCOPY;  Service: Gastroenterology;  Laterality: N/A;  SIGN LANAGUAGE INTERPRETER  . ESOPHAGOGASTRODUODENOSCOPY N/A 06/21/2019   Procedure: ESOPHAGOGASTRODUODENOSCOPY (EGD);  Surgeon: Toledo, Benay Pike, MD;  Location: [facility] ENDOSCOPY;  Service: Gastroenterology;  Laterality: N/A;  . ESOPHAGOGASTRODUODENOSCOPY (EGD) WITH PROPOFOL N/A 05/27/2018   Procedure: ESOPHAGOGASTRODUODENOSCOPY (EGD) WITH PROPOFOL;  Surgeon: Clarene Essex, MD;  Location: [place];  Service: Endoscopy;  Laterality: N/A;  . ICD LEAD REMOVAL Left 08/25/2019   Procedure: ICD LEAD EXTRACTION;  Surgeon: [name], MD;  Location: [facility];  Service: Cardiovascular;  Laterality: Left;  DR. Roxan Hockey BACK UP  . INSERT / REPLACE / REMOVE PACEMAKER    . LEFT HEART CATHETERIZATION WITH CORONARY/GRAFT ANGIOGRAM N/A 12/10/2011   Procedure: LEFT HEART CATHETERIZATION WITH Beatrix Fetters;  Surgeon: Sanda Klein, MD;  Location: [facility] CATH LAB;  Service: Cardiovascular;  Laterality: N/A;  . LEFT HEART CATHETERIZATION WITH CORONARY/GRAFT ANGIOGRAM N/A 09/25/2013   Procedure: LEFT HEART CATHETERIZATION WITH Beatrix Fetters;  Surgeon: Blane Ohara, MD;  Location: [facility] CATH LAB;  Service: Cardiovascular;  Laterality: N/A;  . MULTIPLE EXTRACTIONS WITH ALVEOLOPLASTY Bilateral 08/30/2019  Procedure: Extractionof tooth #'s 2, 28, and 31 with alveoloplasty and gross debridement of remaining teeth.;  Surgeon: [name], DDS;  Location: [place];  Service: Oral Surgery;  Laterality: Bilateral;  . Persantine Myoview  05/06/2010   Post-rest ejection fraction 30%. No significant ischemia demonstrated. Compared to previous study there is no significant change.  . TEE WITHOUT CARDIOVERSION N/A 06/22/2019   Procedure: TRANSESOPHAGEAL  ECHOCARDIOGRAM (TEE);  Surgeon: Minna Merritts, MD;  Location: [facility] ORS;  Service: Cardiovascular;  Laterality: N/A;  . TEE WITHOUT CARDIOVERSION N/A 08/23/2019   Procedure: TRANSESOPHAGEAL ECHOCARDIOGRAM (TEE);  Surgeon: Kate Sable, MD;  Location: [facility] ORS;  Service: Cardiovascular;  Laterality: N/A;  . TEE WITHOUT CARDIOVERSION N/A 08/25/2019   Procedure: TRANSESOPHAGEAL ECHOCARDIOGRAM (TEE);  Surgeon: [name], MD;  Location: [facility] OR;  Service: Cardiovascular;  Laterality: N/A;  . TRANSURETHRAL RESECTION OF PROSTATE     s/p     reports that he quit smoking about 34 years ago. He has never used smokeless tobacco. He reports that he does not drink alcohol and does not use drugs.  Allergies  Allergen Reactions  . Entresto [Sacubitril-Valsartan] Swelling    And bruising of arm  . Phenazopyridine Nausea Only and Other (See Comments)    GI UPSET  . Ramipril Other (See Comments)    unk Other reaction(s): Other (See Comments), Unknown unk    Family History  Problem Relation Age of Onset  . Hypertension Father      Prior to Admission medications   Medication Sig Start Date End Date Taking? Authorizing Provider  acetaminophen (TYLENOL) 325 MG tablet Take 2 tablets (650 mg total) by mouth every 6 (six) hours as needed for mild pain or fever. 08/23/19   Loletha Grayer, MD  albuterol (PROVENTIL) (2.5 MG/3ML) 0.083% nebulizer solution Inhale 3 mLs into the lungs every 4 (four) hours as needed for wheezing or shortness of breath. 08/23/19   Loletha Grayer, MD  ferrous sulfate 325 (65 FE) MG tablet Take 1 tablet (325 mg total) by mouth 2 (two) times daily with a meal. Patient taking differently: Take 325 mg by mouth daily with breakfast.  09/11/19 12/05/19  Mercy Riding, MD  losartan (COZAAR) 25 MG tablet Take 25 mg by mouth daily.    [provider]  midodrine (PROAMATINE) 5 MG tablet TAKE 1 TABLET (5 MG TOTAL) BY MOUTH 3 (THREE) TIMES DAILY WITH MEALS. 01/08/20   [name], MD  Multiple Vitamin (MULTIVITAMIN WITH MINERALS) TABS tablet Take 1 tablet by mouth daily. 05/29/19   Ezekiel Slocumb, DO  Nystatin (GERHARDT'S BUTT CREAM) CREA Apply 1 application topically 2 (two) times daily. Patient taking differently: Apply 1 application topically as needed.  09/11/19   Mercy Riding, MD  ondansetron (ZOFRAN) 4 MG tablet Take 1 tablet (4 mg total) by mouth every 8 (eight) hours as needed for nausea or vomiting. 09/12/19 09/11/20  Mercy Riding, MD  pantoprazole (PROTONIX) 40 MG tablet Take 1 tablet (40 mg total) by mouth 2 (two) times daily for 14 days, THEN 1 tablet (40 mg total) daily. Patient not taking: Reported on 12/05/2019 09/12/19 12/11/19  Mercy Riding, MD  simvastatin (ZOCOR) 20 MG tablet TAKE 1 TABLET BY MOUTH EVERY DAY 04/07/19   Erlene Quan, PA-C  spironolactone (ALDACTONE) 25 MG tablet Take 1 tablet (25 mg total) by mouth daily. 08/01/19 12/05/19  Alisa Graff, FNP  torsemide (DEMADEX) 20 MG tablet Take 2 tablets (40 mg total)  by mouth 2 (two) times daily. 09/11/19   Mercy Riding, MD    Physical Exam: Vitals:   01/15/20 0600 01/15/20 0630 01/15/20 0700 01/15/20 0715  BP: 110/60 116/61 (!) 108/58 (!) 109/57  Pulse: 69 70 70 70  Resp: 18 (!) 23 18 17   Temp:  97.8 F (36.6 C) 97.8 F (36.6 C)   TempSrc:  Oral Oral   SpO2: 97% 99% 98% 97%  Weight:      Height:         Vitals:   01/15/20 0600 01/15/20 0630 01/15/20 0700 01/15/20 0715  BP: 110/60 116/61 (!) 108/58 (!) 109/57  Pulse: 69 70 70 70  Resp: 18 (!) 23 18 17   Temp:  97.8 F (36.6 C) 97.8 F (36.6 C)   TempSrc:  Oral Oral   SpO2: 97% 99% 98% 97%  Weight:      Height:        Constitutional: NAD, alert and oriented x 3.  Appears comfortable and in no distress.  Chronically ill-appearing Eyes: PERRL, lids and conjunctivae pallor ENMT: Mucous membranes are moist.  Patient is deaf Neck: normal, supple, no masses, no thyromegaly Respiratory: Bilateral air entry, no wheezing,  decreased air entry at the bases. Normal respiratory effort. No accessory muscle use. Cardiovascular: Regular rate and rhythm, no murmurs / rubs / gallops. No extremity edema. 2+ pedal pulses. No carotid bruits.  Abdomen: no tenderness, no masses palpated. No hepatosplenomegaly. Bowel sounds positive.  Musculoskeletal: no clubbing / cyanosis. No joint deformity upper and lower extremities.  Skin: no rashes, lesions, ulcers.  Neurologic: No gross focal neurologic deficit.  Generalized weakness Psychiatric: Normal mood and affect.   Labs on Admission: I have personally reviewed following labs and imaging studies  CBC: Recent Labs  Lab 01/15/20 0256  WBC 6.7  NEUTROABS 5.4  HGB 6.2*  HCT 20.6*  MCV 83.7  PLT 149   Basic Metabolic Panel: Recent Labs  Lab 01/15/20 0256  NA 141  K 5.3*  CL 111  CO2 26  GLUCOSE 117*  BUN 31*  CREATININE 1.17  CALCIUM 8.3*   GFR: Estimated Creatinine Clearance: 56.3 mL/min (by C-G formula based on SCr of 1.17 mg/dL). Liver Function Tests: Recent Labs  Lab 01/15/20 0256  AST 18  ALT 11  ALKPHOS 121  BILITOT 0.6  PROT 5.1*  ALBUMIN 2.2*   No results for input(s): LIPASE, AMYLASE in the last 168 hours. No results for input(s): AMMONIA in the last 168 hours. Coagulation Profile: Recent Labs  Lab 01/15/20 0301  INR 1.3*   Cardiac Enzymes: No results for input(s): CKTOTAL, CKMB, CKMBINDEX, TROPONINI in the last 168 hours. BNP (last 3 results) No results for input(s): PROBNP in the last 8760 hours. HbA1C: No results for input(s): HGBA1C in the last 72 hours. CBG: No results for input(s): GLUCAP in the last 168 hours. Lipid Profile: No results for input(s): CHOL, HDL, LDLCALC, TRIG, CHOLHDL, LDLDIRECT in the last 72 hours. Thyroid Function Tests: No results for input(s): TSH, T4TOTAL, FREET4, T3FREE, THYROIDAB in the last 72 hours. Anemia Panel: No results for input(s): VITAMINB12, FOLATE, FERRITIN, TIBC, IRON, RETICCTPCT in the  last 72 hours. Urine analysis:    Component Value Date/Time   COLORURINE YELLOW (A) 08/20/2019 0924   APPEARANCEUR HAZY (A) 08/20/2019 0924   APPEARANCEUR Clear 12/13/2017 0918   LABSPEC 1.015 08/20/2019 0924   LABSPEC 1.021 11/19/2011 1501   PHURINE 5.0 08/20/2019 0924   GLUCOSEU NEGATIVE 08/20/2019 0924   GLUCOSEU Negative  11/19/2011 1501   HGBUR NEGATIVE 08/20/2019 [place] 08/20/2019 0924   BILIRUBINUR Negative 12/13/2017 0918   BILIRUBINUR Negative 11/19/2011 1501   KETONESUR NEGATIVE 08/20/2019 0924   PROTEINUR NEGATIVE 08/20/2019 0924   NITRITE NEGATIVE 08/20/2019 0924   LEUKOCYTESUR NEGATIVE 08/20/2019 0924   LEUKOCYTESUR Negative 11/19/2011 1501    Radiological Exams on Admission: CT Angio Abd/Pel w/ and/or w/o  Result Date: 01/15/2020 CLINICAL DATA:  Acute nonlocalized abdominal pain.  GI bleeding EXAM: CTA ABDOMEN AND PELVIS WITHOUT AND WITH CONTRAST TECHNIQUE: Multidetector CT imaging of the abdomen and pelvis was performed using the standard protocol during bolus administration of intravenous contrast. Multiplanar reconstructed images and MIPs were obtained and reviewed to evaluate the vascular anatomy. CONTRAST:  165mL OMNIPAQUE IOHEXOL 350 MG/ML SOLN COMPARISON:  06/17/2019 abdomen and pelvis CT without contrast FINDINGS: VASCULAR Aorta: Stent grafting of the aorta and iliacs. No high-density hematoma seen within the wall and noncontrast phase. Well-opposed stent graft to the infrarenal aorta. No adjacent fat stranding seen. Celiac: Heavily calcified branches from atherosclerosis. No acute finding. Proximal narrowing is approximately 50%. SMA: Diffuse atheromatous plaque. No branch occlusion or dissection seen. Renals: Accessory upper pole renal artery on the left. The renal arteries are heavily calcified. No aneurysm or occlusion seen. No detected beading. IMA: Poorly enhancing, being covered by the stent graft Inflow: Heavily calcified on both sides. 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 1.8 cm aneurysmal right common iliac artery. No significant change from prior. No superimposed dissection. Proximal Outflow: Diffuse atheromatous calcification Veins: No portal or systemic venous occlusion is seen. 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 Review of the MIP images confirms the above findings. NON-VASCULAR Lower chest: At least moderate bilateral pleural effusion with atelectasis. The heart is enlarged and there is a biventricular pacer. Hepatobiliary: 2.8 cm cystic density mass in the left lobe liver. No visible gallbladder inflammation or calcified stone Pancreas: Fat stranding and expansion around the pancreatic head. Spleen: Granulomatous calcifications Adrenals/Urinary Tract: Negative adrenals. Patchy right renal cortical scarring which is extensive. Renal hilar calcifications are atheromatous. Unremarkable bladder. Stomach/Bowel: No intraluminal hemorrhage is seen when accounting for intrinsic high-density bowel contents at the distal colon. Inflammation around the mid duodenum. There are a few colonic diverticula Lymphatic: Negative for adenopathy or mass Reproductive: Enlarged prostate, asymmetric to the left, unchanged. Fiducial markers are present in the prostate. Other: No ascites or pneumoperitoneum Musculoskeletal: Degenerative disease without acute finding IMPRESSION: VASCULAR 1. No emergent finding. 2. Aortoiliac stent grafting. 3.  Aortic Atherosclerosis (ICD10-I70.0). NON-VASCULAR 1. Inflammation at the pancreaticoduodenal groove which could be from pancreatitis or peptic ulcer disease, please correlate with labs. 2. Chronic bilateral pleural effusion with atelectasis. 3. 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 4. Extensive right renal cortical scarring. Electronically Signed   By: Monte Fantasia M.D.   On: 01/15/2020 05:00    EKG: Independently reviewed.   Assessment/Plan Principal Problem:   GI  bleed Active Problems:   Permanent atrial fibrillation (HCC)   Benign essential HTN   CAD S/P multiple PCIs   Bilateral deafness   CRI (chronic renal insufficiency), stage 3 (moderate) (HCC)   Acute blood loss anemia   Duodenal arteriovenous malformation   Chronic diastolic CHF (congestive heart failure) (HCC)       Acute blood loss anemia Secondary to GI source of blood loss Hemoglobin on admission was 6.2g/dl compared to 8.9g/dl in September, 2021 Unclear etiology, patient has a history of known duodenal AVMs as well as  history of peptic ulcer disease and complaints of NSAID use We will transfuse patient 2 units of packed RBC Place patient on Protonix drip Consult gastroenterology     Permanent atrial fibrillation Rate controlled Not on chronic anticoagulation due to history of GI bleed    Chronic diastolic dysfunction CHF Last known LVEF of 50 to 55% Hold losartan, torsemide and spironolactone   History of coronary artery disease Hold aspirin due to GI bleed Hold statins for now    DVT prophylaxis: SCD Code Status: Full code Family Communication: Greater than 50% of time spent discussing patient's condition with him at the bedside.  All questions and concerns have been addressed. Disposition Plan: Back to previous home environment Consults called: Gastroenterology    Collier Bullock MD [facility]     01/15/2020, 8:19 AM

## 2020-01-15 NOTE — Op Note (Signed)
[facility] Gastroenterology Patient Name: Thomas [name] Procedure Date: 01/15/2020 3:37 PM MRN: [id number] Account #: [id number] Date of Birth: 1934-06-03 Admit Type: Inpatient Age: 85 Room: [facility] ENDO ROOM 2 Gender: Male Note Status: Finalized Procedure:             Upper GI endoscopy Indications:           Melena Providers:             Lucilla Lame MD, MD Referring MD:          Irven Easterly. Kary Kos, MD (Referring MD) Medicines:             Propofol per Anesthesia Complications:         No immediate complications. Procedure:             Pre-Anesthesia Assessment:                        - Prior to the procedure, a History and Physical was                         performed, and patient medications and allergies were                         reviewed. The patient's tolerance of previous                         anesthesia was also reviewed. The risks and benefits                         of the procedure and the sedation options and risks                         were discussed with the patient. All questions were                         answered, and informed consent was obtained. Prior                         Anticoagulants: The patient has taken no previous                         anticoagulant or antiplatelet agents. ASA Grade                         Assessment: II - A patient with mild systemic disease.                         After reviewing the risks and benefits, the patient                         was deemed in satisfactory condition to undergo the                         procedure.                        After obtaining informed consent, the endoscope was  passed under direct vision. Throughout the procedure,                         the patient's blood pressure, pulse, and oxygen                         saturations were monitored continuously. The Endoscope                         was introduced through the mouth, and advanced to the                          second part of duodenum. The upper GI endoscopy was                         accomplished without difficulty. The patient tolerated                         the procedure well. Findings:      The examined esophagus was normal.      The stomach was normal.      Few non-bleeding cratered duodenal ulcers with no stigmata of bleeding       were found in the duodenal bulb. Impression:            - Normal esophagus.                        - Normal stomach.                        - Non-bleeding duodenal ulcers with no stigmata of                         bleeding.                        - No specimens collected. Recommendation:        - Return patient to hospital ward for ongoing care.                        - Clear liquid diet.                        - Continue present medications.                        - Use a proton pump inhibitor PO BID. Procedure Code(s):     --- Professional ---                        [phone], Esophagogastroduodenoscopy, flexible,                         transoral; diagnostic, including collection of                         specimen(s) by brushing or washing, when performed                         (separate procedure) Diagnosis Code(s):     --- Professional ---  K92.1, Melena (includes Hematochezia)                        K26.9, Duodenal ulcer, unspecified as acute or                         chronic, without hemorrhage or perforation CPT copyright 2019 American Medical Association. All rights reserved. The codes documented in this report are preliminary and upon coder review may  be revised to meet current compliance requirements. Lucilla Lame MD, MD 01/15/2020 4:14:28 PM This report has been signed electronically. Number of Addenda: 0 Note Initiated On: 01/15/2020 3:37 PM Estimated Blood Loss:  Estimated blood loss: none.      [facility]

## 2020-01-15 NOTE — Transfer of Care (Signed)
Immediate Anesthesia Transfer of Care Note  Patient: Thomas [name]  Procedure(s) Performed: ESOPHAGOGASTRODUODENOSCOPY (EGD) (N/A )  Patient Location: Endoscopy Unit  Anesthesia Type:General  Level of Consciousness: drowsy and responds to stimulation  Airway & Oxygen Therapy: Patient Spontanous Breathing and Patient connected to face mask oxygen  Post-op Assessment: Report given to RN and Post -op Vital signs reviewed and stable  Post vital signs: Reviewed and stable  Last Vitals:  Vitals Value Taken Time  BP 113/60 01/15/20 1617  Temp 36.6 C 01/15/20 1617  Pulse 70 01/15/20 1619  Resp 17 01/15/20 1619  SpO2 100 % 01/15/20 1619  Vitals shown include unvalidated device data.  Last Pain:  Vitals:   01/15/20 1617  TempSrc: Temporal  PainSc: Asleep         Complications: No complications documented.

## 2020-01-15 NOTE — ED Notes (Signed)
Pt transported to CT.  Blood consent signed using ASL interpreter Apolonio Schneiders on [employer].

## 2020-01-15 NOTE — Anesthesia Preprocedure Evaluation (Signed)
Anesthesia Evaluation  Patient identified by MRN, date of birth, ID band Patient awake    Reviewed: Allergy & Precautions, H&P , NPO status , Patient's Chart, lab work & pertinent test results  History of Anesthesia Complications Negative for: history of anesthetic complications  Airway Mallampati: III  TM Distance: <3 FB Neck ROM: limited    Dental  (+) Chipped, Poor Dentition, Missing   Pulmonary neg shortness of breath, sleep apnea , former smoker,    Pulmonary exam normal        Cardiovascular Exercise Tolerance: Good hypertension, (-) angina+ CAD, + Past MI, + CABG and +CHF  Normal cardiovascular exam+ dysrhythmias + pacemaker + Cardiac Defibrillator      Neuro/Psych CVA negative psych ROS   GI/Hepatic Neg liver ROS, PUD, GERD  Medicated and Controlled,  Endo/Other  negative endocrine ROS  Renal/GU Renal disease  negative genitourinary   Musculoskeletal   Abdominal   Peds  Hematology negative hematology ROS (+)   Anesthesia Other Findings Patient is NPO appropriate and reports no nausea or vomiting today.   Past Medical History: No date: AICD (automatic cardioverter/defibrillator) present No date: Bacteremia due to group B Streptococcus     Comment:  Recurrent admissions for group B Strepotococcus               bacteremia of unknown source with TEEs negative for               vegetation 06/2019 and 08/2019 03/24/2005: Biventricular ICD (implantable cardioverter- defibrillator) in place     Comment:  Implantation of a Medtronic Adapta ADDRO1, serial number              [id number] No date: CHF (congestive heart failure) (HCC) No date: CKD (chronic kidney disease), stage III ([place]) No date: Coronary artery disease     Comment:  a. s/p CABG 1986. b. Multiple PCIs/caths. c. 09/2013: s/p              PTCA and BMS to SVG-OM. No date: Deaf     Comment:  Requires sign language interpreter No date:  Dysrhythmia No date: History of abdominal aortic aneurysm 1980: History of bleeding peptic ulcer 2013: History of epididymitis No date: HTN (hypertension) No date: Hydronephrosis with ureteropelvic junction obstruction 2009: Hydroureter on left No date: Hypertension No date: Ischemic cardiomyopathy     Comment:  a. Prior EF 30-35%, s/p BIV-ICD. b. 09/2013: EF 45-50%. No date: Moderate tricuspid regurgitation No date: PAF (paroxysmal atrial fibrillation) (HCC)     Comment:  Not on [place] 2/2 GIB 2002: Presence of permanent cardiac pacemaker     Comment:  Original placed in 2002 for CHB then 2007 and 2014               device change out No date: Prostate cancer [facility]) 1986: Status post coronary artery bypass grafting     Comment:  LIMA to the LAD, SVG to OM, SVG to RCA No date: Testicular swelling  Past Surgical History: 11/20/2011: 2-D echocardiogram     Comment:  Ejection fraction 30-35% moderate concentric left               ventricular hypertrophy. Left atrium is moderately               dilated. Mild MR. Mild or 12/16/2012: BI-VENTRICULAR IMPLANTABLE CARDIOVERTER DEFIBRILLATOR; N/A     Comment:  Procedure: BI-VENTRICULAR IMPLANTABLE CARDIOVERTER               DEFIBRILLATOR  (  CRT-D);  Surgeon: [name], MD;                Location: [facility] CATH LAB;  Service: Cardiovascular;                Laterality: N/A; 09/01/2019: BIV ICD INSERTION CRT-D; N/A     Comment:  Procedure: BIV ICD INSERTION CRT-D;  Surgeon: [name], MD;  Location: [place] CV LAB;  Service:               Cardiovascular;  Laterality: N/A; 12/10/2011: CARDIAC CATHETERIZATION     Comment:  SVG to OM widely patent.  LIMA to LAD patent 10/12/2017: CATARACT EXTRACTION W/PHACO; Right     Comment:  Procedure: CATARACT EXTRACTION PHACO AND INTRAOCULAR               LENS PLACEMENT (IOC);  Surgeon: Birder Robson, MD;                Location: [facility] ORS;  Service: Ophthalmology;  Laterality:               Right;  [place] 00:57 AP% 15.9 CDE 9.07 Fluid pack lot #               [id number] 07/13/2018: COLONOSCOPY; N/A     Comment:  Procedure: COLONOSCOPY;  Surgeon: Toledo, Benay Pike, MD;              Location: [facility] ENDOSCOPY;  Service: Gastroenterology;                Laterality: N/A; 1986: CORONARY ARTERY BYPASS GRAFT 09/14/2018: ENTEROSCOPY; N/A     Comment:  Procedure: ENTEROSCOPY;  Surgeon: Toledo, Benay Pike, MD;              Location: [facility] ENDOSCOPY;  Service: Gastroenterology;                Laterality: N/A;  symptomatic anemia, GI blood loss               anemia, melena, positive small bowel capsule endoscopy               showing source of bleeding  08/22/2019: ENTEROSCOPY; N/A     Comment:  Procedure: ENTEROSCOPY;  Surgeon: Lin Landsman,               MD;  Location: [facility] ENDOSCOPY;  Service:               Gastroenterology;  Laterality: N/A; 07/13/2018: ESOPHAGOGASTRODUODENOSCOPY; N/A     Comment:  Procedure: ESOPHAGOGASTRODUODENOSCOPY (EGD);  Surgeon:               Toledo, Benay Pike, MD;  Location: [facility] ENDOSCOPY;                Service: Gastroenterology;  Laterality: N/A; 09/14/2018: ESOPHAGOGASTRODUODENOSCOPY; N/A     Comment:  Procedure: ESOPHAGOGASTRODUODENOSCOPY (EGD);  Surgeon:               Toledo, Benay Pike, MD;  Location: [facility] ENDOSCOPY;                Service: Gastroenterology;  Laterality: N/A;  SIGN               LANAGUAGE INTERPRETER 06/21/2019: ESOPHAGOGASTRODUODENOSCOPY; N/A     Comment:  Procedure: ESOPHAGOGASTRODUODENOSCOPY (EGD);  Surgeon:  Toledo, Benay Pike, MD;  Location: [facility] ENDOSCOPY;                Service: Gastroenterology;  Laterality: N/A; 05/27/2018: ESOPHAGOGASTRODUODENOSCOPY (EGD) WITH PROPOFOL; N/A     Comment:  Procedure: ESOPHAGOGASTRODUODENOSCOPY (EGD) WITH               PROPOFOL;  Surgeon: Clarene Essex, MD;  Location: [place];  Service: Endoscopy;  Laterality: N/A; 08/25/2019: ICD LEAD REMOVAL; Left     Comment:   Procedure: ICD LEAD EXTRACTION;  Surgeon: [name], MD;  Location: [place];  Service: Cardiovascular;                Laterality: Left;  DR. Roxan Hockey BACK UP No date: INSERT / REPLACE / REMOVE PACEMAKER 12/10/2011: LEFT HEART CATHETERIZATION WITH CORONARY/GRAFT ANGIOGRAM;  N/A     Comment:  Procedure: LEFT HEART CATHETERIZATION WITH               Beatrix Fetters;  Surgeon: Sanda Klein, MD;                Location: [place] CATH LAB;  Service: Cardiovascular;                Laterality: N/A; 09/25/2013: LEFT HEART CATHETERIZATION WITH CORONARY/GRAFT ANGIOGRAM;  N/A     Comment:  Procedure: LEFT HEART CATHETERIZATION WITH               Beatrix Fetters;  Surgeon: Blane Ohara, MD;              Location: [facility] CATH LAB;  Service: Cardiovascular;                Laterality: N/A; 08/30/2019: MULTIPLE EXTRACTIONS WITH ALVEOLOPLASTY; Bilateral     Comment:  Procedure: Extractionof tooth #'s 2, 28, and 31 with               alveoloplasty and gross debridement of remaining teeth.;               Surgeon: [name], DDS;  Location: [place];                Service: Oral Surgery;  Laterality: Bilateral; 05/06/2010: Persantine Myoview     Comment:  Post-rest ejection fraction 30%. No significant ischemia              demonstrated. Compared to previous study there is no               significant change. 06/22/2019: TEE WITHOUT CARDIOVERSION; N/A     Comment:  Procedure: TRANSESOPHAGEAL ECHOCARDIOGRAM (TEE);                Surgeon: Minna Merritts, MD;  Location: [facility] ORS;                Service: Cardiovascular;  Laterality: N/A; 08/23/2019: TEE WITHOUT CARDIOVERSION; N/A     Comment:  Procedure: TRANSESOPHAGEAL ECHOCARDIOGRAM (TEE);                Surgeon: Kate Sable, MD;  Location: [facility] ORS;                Service: Cardiovascular;  Laterality: N/A; 08/25/2019: TEE WITHOUT CARDIOVERSION; N/A     Comment:  Procedure: TRANSESOPHAGEAL ECHOCARDIOGRAM (TEE);  Surgeon: [name], MD;  Location: [facility] OR;  Service:              Cardiovascular;  Laterality: N/A; No date: TRANSURETHRAL RESECTION OF PROSTATE     Comment:  s/p  BMI    Body Mass Index: 23.13 kg/m      Reproductive/Obstetrics negative OB ROS                             Anesthesia Physical Anesthesia Plan  ASA: III  Anesthesia Plan: General   Post-op Pain Management:    Induction: Intravenous  PONV Risk Score and Plan: Propofol infusion and TIVA  Airway Management Planned: Natural Airway and Nasal Cannula  Additional Equipment:   Intra-op Plan:   Post-operative Plan:   Informed Consent: I have reviewed the patients History and Physical, chart, labs and discussed the procedure including the risks, benefits and alternatives for the proposed anesthesia with the patient or authorized representative who has indicated his/her understanding and acceptance.     Dental Advisory Given  Plan Discussed with: Anesthesiologist, CRNA and Surgeon  Anesthesia Plan Comments: (Consent via interpreter   Patient consented for risks of anesthesia including but not limited to:  - adverse reactions to medications - risk of intubation if required - damage to eyes, teeth, lips or other oral mucosa - nerve damage due to positioning  - sore throat or hoarseness - Damage to heart, brain, nerves, lungs, other parts of body or loss of life  Patient voiced understanding.)        Anesthesia Quick Evaluation

## 2020-01-15 NOTE — Anesthesia Procedure Notes (Signed)
Procedure Name: General with mask airway Performed by: Fletcher-Harrison, Ashyr Hedgepath, CRNA Pre-anesthesia Checklist: Patient identified, Emergency Drugs available, Suction available and Patient being monitored Patient Re-evaluated:Patient Re-evaluated prior to induction Oxygen Delivery Method: Simple face mask Induction Type: IV induction Placement Confirmation: positive ETCO2 and CO2 detector Dental Injury: Teeth and Oropharynx as per pre-operative assessment        

## 2020-01-16 ENCOUNTER — Encounter: Payer: Self-pay | Admitting: Internal Medicine

## 2020-01-16 DIAGNOSIS — H9193 Unspecified hearing loss, bilateral: Secondary | ICD-10-CM

## 2020-01-16 DIAGNOSIS — K264 Chronic or unspecified duodenal ulcer with hemorrhage: Principal | ICD-10-CM

## 2020-01-16 DIAGNOSIS — I10 Essential (primary) hypertension: Secondary | ICD-10-CM

## 2020-01-16 LAB — BASIC METABOLIC PANEL
Anion gap: 7 (ref 5–15)
BUN: 25 mg/dL — ABNORMAL HIGH (ref 8–23)
CO2: 23 mmol/L (ref 22–32)
Calcium: 8.3 mg/dL — ABNORMAL LOW (ref 8.9–10.3)
Chloride: 109 mmol/L (ref 98–111)
Creatinine, Ser: 1.26 mg/dL — ABNORMAL HIGH (ref 0.61–1.24)
GFR, Estimated: 57 mL/min — ABNORMAL LOW (ref >60)
Glucose, Bld: 94 mg/dL (ref 70–99)
Potassium: 4.5 mmol/L (ref 3.5–5.1)
Sodium: 139 mmol/L (ref 135–145)

## 2020-01-16 LAB — CBC
HCT: 22.9 % — ABNORMAL LOW (ref 39.0–52.0)
Hemoglobin: 7.2 g/dL — ABNORMAL LOW (ref 13.0–17.0)
MCH: 26.6 pg (ref 26.0–34.0)
MCHC: 31.4 g/dL (ref 30.0–36.0)
MCV: 84.5 fL (ref 80.0–100.0)
Platelets: 211 10*3/uL (ref 150–400)
RBC: 2.71 MIL/uL — ABNORMAL LOW (ref 4.22–5.81)
RDW: 20.3 % — ABNORMAL HIGH (ref 11.5–15.5)
WBC: 5.8 10*3/uL (ref 4.0–10.5)
nRBC: 0 % (ref 0.0–0.2)

## 2020-01-16 LAB — HEMOGLOBIN AND HEMATOCRIT, BLOOD
HCT: 23.9 % — ABNORMAL LOW (ref 39.0–52.0)
Hemoglobin: 7.5 g/dL — ABNORMAL LOW (ref 13.0–17.0)

## 2020-01-16 LAB — PREPARE RBC (CROSSMATCH)

## 2020-01-16 MED ORDER — SIMVASTATIN 20 MG PO TABS
20.0000 mg | ORAL_TABLET | Freq: Every day | ORAL | Status: DC
  Administered 2020-01-16 – 2020-01-21 (×6): 20 mg via ORAL
  Filled 2020-01-16 (×7): qty 1

## 2020-01-16 MED ORDER — PANTOPRAZOLE SODIUM 40 MG PO TBEC: 40.0000 mg | DELAYED_RELEASE_TABLET | Freq: Two times a day (BID) | ORAL | Status: DC

## 2020-01-16 MED ORDER — SODIUM CHLORIDE 0.9% IV SOLUTION: Freq: Once | INTRAVENOUS | Status: AC

## 2020-01-16 MED ORDER — SODIUM CHLORIDE 0.9% IV SOLUTION: Freq: Once | INTRAVENOUS | Status: DC

## 2020-01-16 MED ORDER — PANTOPRAZOLE SODIUM 40 MG IV SOLR
40.0000 mg | Freq: Two times a day (BID) | INTRAVENOUS | Status: DC
  Administered 2020-01-16 – 2020-01-21 (×11): 40 mg via INTRAVENOUS
  Filled 2020-01-16 (×11): qty 40

## 2020-01-16 NOTE — Progress Notes (Addendum)
Progress Note    Thomas [name]  QVZ:[id number] DOB: [birth year]  DOA: 01/15/2020 PCP: [name], MD      Brief Narrative:    Medical records reviewed and are as summarized below:  Thomas [name] is a 85 y.o. male with medical history significant for impaired hearing, CAD, high-grade AVB s/p BiV-ICD, systolic CHF with recovered EF, CKD-3A, A. fib not on AC due to GI bleed/duodenal AVM, lymphedema, peptic ulcer disease, prostate cancer and history of group B strep bacteremia s/p 6 weeks of antibiotic therapy and status post extraction of BiV-ICD system due to recurrent bacteremia. He had right-sided BiV-ICD insertion CRT-D on 6/18. He also underwent dental extraction involving tooth #2, 28 and 31 with 2 quadrants of alveoloplasty.  Patient was brought into the emergency room by EMS for evaluation of dark red stools right upper quadrant abdominal pain.  His hemoglobin was 6.2 on admission.  He was admitted to the hospital for acute blood loss anemia secondary to acute GI bleeding.  He was treated with IV Protonix infusion and IV fluids.  He was also transfused with a total of 3 units of packed red blood cells.  He was evaluated by the gastroenterologist and he underwent EGD on 01/15/2020 which showed loss of focus, normal stomach and nonbleeding duodenal ulcer with no stigmata of bleeding.    Assessment/Plan:   Principal Problem:   GI bleed Active Problems:   Permanent atrial fibrillation (HCC)   Benign essential HTN   CAD S/P multiple PCIs   Bilateral deafness   CRI (chronic renal insufficiency), stage 3 (moderate) (HCC)   Acute blood loss anemia   Duodenal arteriovenous malformation   Chronic diastolic CHF (congestive heart failure) (HCC)   Duodenal ulceration    Body mass index is 23.6 kg/m.    Acute upper GI bleeding/melena: s/p EGD on 01/15/2020.  EGD showed normal esophagus, normal stomach and nonbleeding duodenal ulcers with no stigmata of bleeding.  Patient  is still have some dark stools.  Monitor in the hospital for another day.  Continue IV Protonix.  Dr. Verl Blalock, gastroenterologist was notified about patient's black tarry stools.  Acute blood loss anemia: s/p transfusion with 2 units of PRBCs on 01/15/2020.  Hemoglobin is only 7.2.  Given history of CAD and CHF, patient will be transfused with another unit of blood today.  Monitor H&H closely.  CAD s/p CABG: Hold aspirin for now with plan to resume at discharge.  Resume simvastatin.  Chronic diastolic CHF/hypertension: Aldactone, torsemide and losartan have been held.  Continue to hold these medications because blood pressure is still soft and patient still having some dark stools.  These medications may be resumed tomorrow if blood pressure is stable and patient is no longer bleeding.  Permanent atrial fibrillation: He is not on long-term anticoagulation because of history of GI bleed  CKD stage IIIa: Creatinine is stable.  Continue to monitor.  Deafness: Patient is deaf so the interpretation services via iPad was used for this encounter.  Diet Order            Diet clear liquid Room service appropriate? Yes; Fluid consistency: Thin  Diet effective now                    Consultants:  Gastroenterologist  Procedures:  EGD on 01/15/2020    Medications:   . sodium chloride   Intravenous Once  . pantoprazole (PROTONIX) IV  40 mg Intravenous Q12H  . sodium chloride  flush  3 mL Intravenous Q12H   Continuous Infusions: . sodium chloride       Anti-infectives (From admission, onward)   None             Family Communication/Anticipated D/C date and plan/Code Status   DVT prophylaxis: SCDs Start: 01/15/20 0998     Code Status: Full Code  Family Communication: None Disposition Plan:    Status is: Inpatient  Remains inpatient appropriate because:IV treatments appropriate due to intensity of illness or inability to take PO and Inpatient level of care appropriate  due to severity of illness   Dispo: The patient is from: Home              Anticipated d/c is to: Home              Anticipated d/c date is: 1 day              Patient currently is not medically stable to d/c.           Subjective:   Patient has black tarry stools this morning.  His nurse and CNA were at the bedside changing his linen when I walked in.  Objective:    Vitals:   01/15/20 1940 01/16/20 0402 01/16/20 0502 01/16/20 0856  BP: 105/63 99/60  104/62  Pulse: 78 70  70  Resp: 17 19    Temp: 97.9 F (36.6 C) (!) 97.5 F (36.4 C)  97.9 F (36.6 C)  TempSrc: Oral Oral  Axillary  SpO2: 98% 98%  98%  Weight:   83.4 kg   Height:       No data found.   Intake/Output Summary (Last 24 hours) at 01/16/2020 1042 Last data filed at 01/16/2020 1000 Gross per 24 hour  Intake 1237.3 ml  Output 550 ml  Net 687.3 ml   Filed Weights   01/15/20 0306 01/15/20 1527 01/16/20 0502  Weight: 81.7 kg 81.7 kg 83.4 kg    Exam:  GEN: NAD SKIN: Warm and dry EYES: Pale, anicteric ENT: MMM.  Bilateral deafness CV: RRR PULM: CTA B ABD: soft, ND, NT, +BS CNS: AAO x 3, non focal EXT: No edema or tenderness   Data Reviewed:   I have personally reviewed following labs and imaging studies:  Labs: Labs show the following:   Basic Metabolic Panel: Recent Labs  Lab 01/15/20 0256 01/16/20 0514  NA 141 139  K 5.3* 4.5  CL 111 109  CO2 26 23  GLUCOSE 117* 94  BUN 31* 25*  CREATININE 1.17 1.26*  CALCIUM 8.3* 8.3*   GFR Estimated Creatinine Clearance: 52.6 mL/min (A) (by C-G formula based on SCr of 1.26 mg/dL (H)). Liver Function Tests: Recent Labs  Lab 01/15/20 0256  AST 18  ALT 11  ALKPHOS 121  BILITOT 0.6  PROT 5.1*  ALBUMIN 2.2*   No results for input(s): LIPASE, AMYLASE in the last 168 hours. No results for input(s): AMMONIA in the last 168 hours. Coagulation profile Recent Labs  Lab 01/15/20 0301  INR 1.3*    CBC: Recent Labs  Lab  01/15/20 0256 01/15/20 1736 01/16/20 0514  WBC 6.7  --  5.8  NEUTROABS 5.4  --   --   HGB 6.2* 7.1* 7.2*  HCT 20.6* 22.8* 22.9*  MCV 83.7  --  84.5  PLT 244  --  211   Cardiac Enzymes: No results for input(s): CKTOTAL, CKMB, CKMBINDEX, TROPONINI in the last 168 hours. BNP (last 3 results) No results  for input(s): PROBNP in the last 8760 hours. CBG: No results for input(s): GLUCAP in the last 168 hours. D-Dimer: No results for input(s): DDIMER in the last 72 hours. Hgb A1c: No results for input(s): HGBA1C in the last 72 hours. Lipid Profile: No results for input(s): CHOL, HDL, LDLCALC, TRIG, CHOLHDL, LDLDIRECT in the last 72 hours. Thyroid function studies: No results for input(s): TSH, T4TOTAL, T3FREE, THYROIDAB in the last 72 hours.  Invalid input(s): FREET3 Anemia work up: No results for input(s): VITAMINB12, FOLATE, FERRITIN, TIBC, IRON, RETICCTPCT in the last 72 hours. Sepsis Labs: Recent Labs  Lab 01/15/20 0256 01/16/20 0514  WBC 6.7 5.8    Microbiology Recent Results (from the past 240 hour(s))  Respiratory Panel by RT PCR (Flu A&B, Covid) - Nasopharyngeal Swab     Status: None   Collection Time: 01/15/20  4:52 AM   Specimen: Nasopharyngeal Swab  Result Value Ref Range Status   SARS Coronavirus 2 by RT PCR NEGATIVE NEGATIVE Final    Comment: (NOTE) SARS-CoV-2 target nucleic acids are NOT DETECTED.  The SARS-CoV-2 RNA is generally detectable in upper respiratoy specimens during the acute phase of infection. The lowest concentration of SARS-CoV-2 viral copies this assay can detect is 131 copies/mL. A negative result does not preclude SARS-Cov-2 infection and should not be used as the sole basis for treatment or other patient management decisions. A negative result may occur with  improper specimen collection/handling, submission of specimen other than nasopharyngeal swab, presence of viral mutation(s) within the areas targeted by this assay, and inadequate  number of viral copies (<131 copies/mL). A negative result must be combined with clinical observations, patient history, and epidemiological information. The expected result is Negative.  Fact Sheet for Patients:  PinkCheek.be  Fact Sheet for Healthcare Providers:  GravelBags.it  This test is no t yet approved or cleared by the [place] FDA and  has been authorized for detection and/or diagnosis of SARS-CoV-2 by FDA under an Emergency Use Authorization (EUA). This EUA will remain  in effect (meaning this test can be used) for the duration of the COVID-19 declaration under Section 564(b)(1) of the Act, 21 U.S.C. section 360bbb-3(b)(1), unless the authorization is terminated or revoked sooner.     Influenza A by PCR NEGATIVE NEGATIVE Final   Influenza B by PCR NEGATIVE NEGATIVE Final    Comment: (NOTE) The Xpert Xpress SARS-CoV-2/FLU/RSV assay is intended as an aid in  the diagnosis of influenza from Nasopharyngeal swab specimens and  should not be used as a sole basis for treatment. Nasal washings and  aspirates are unacceptable for Xpert Xpress SARS-CoV-2/FLU/RSV  testing.  Fact Sheet for Patients: PinkCheek.be  Fact Sheet for Healthcare Providers: GravelBags.it  This test is not yet approved or cleared by the [place] FDA and  has been authorized for detection and/or diagnosis of SARS-CoV-2 by  FDA under an Emergency Use Authorization (EUA). This EUA will remain  in effect (meaning this test can be used) for the duration of the  Covid-19 declaration under Section 564(b)(1) of the Act, 21  U.S.C. section 360bbb-3(b)(1), unless the authorization is  terminated or revoked. Performed at [facility], [address] [place], [place] [address]     Procedures and diagnostic studies:  CT Angio Abd/Pel w/ and/or w/o  Result Date:  01/15/2020 CLINICAL DATA:  Acute nonlocalized abdominal pain.  GI bleeding EXAM: CTA ABDOMEN AND PELVIS WITHOUT AND WITH CONTRAST TECHNIQUE: Multidetector CT imaging of the abdomen and pelvis was performed using  the standard protocol during bolus administration of intravenous contrast. Multiplanar reconstructed images and MIPs were obtained and reviewed to evaluate the vascular anatomy. CONTRAST:  167mL OMNIPAQUE IOHEXOL 350 MG/ML SOLN COMPARISON:  06/17/2019 abdomen and pelvis CT without contrast FINDINGS: VASCULAR Aorta: Stent grafting of the aorta and iliacs. No high-density hematoma seen within the wall and noncontrast phase. Well-opposed stent graft to the infrarenal aorta. No adjacent fat stranding seen. Celiac: Heavily calcified branches from atherosclerosis. No acute finding. Proximal narrowing is approximately 50%. SMA: Diffuse atheromatous plaque. No branch occlusion or dissection seen. Renals: Accessory upper pole renal artery on the left. The renal arteries are heavily calcified. No aneurysm or occlusion seen. No detected beading. IMA: Poorly enhancing, being covered by the stent graft Inflow: Heavily calcified on both sides. 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 No significant change from prior. No superimposed dissection. Proximal Outflow: Diffuse atheromatous calcification Veins: No portal or systemic venous occlusion is seen. 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 Review of the MIP images confirms the above findings. NON-VASCULAR Lower chest: At least moderate bilateral pleural effusion with atelectasis. The heart is enlarged and there is a biventricular pacer. Hepatobiliary: 2.8 cm cystic density mass in the left lobe liver. No visible gallbladder inflammation or calcified stone Pancreas: Fat stranding and expansion around the pancreatic head. Spleen: Granulomatous calcifications Adrenals/Urinary Tract: Negative adrenals. Patchy right  renal cortical scarring which is extensive. Renal hilar calcifications are atheromatous. Unremarkable bladder. Stomach/Bowel: No intraluminal hemorrhage is seen when accounting for intrinsic high-density bowel contents at the distal colon. Inflammation around the mid duodenum. There are a few colonic diverticula Lymphatic: Negative for adenopathy or mass Reproductive: Enlarged prostate, asymmetric to the left, unchanged. Fiducial markers are present in the prostate. Other: No ascites or pneumoperitoneum Musculoskeletal: Degenerative disease without acute finding IMPRESSION: VASCULAR 1. No emergent finding. 2. Aortoiliac stent grafting. 3.  Aortic Atherosclerosis (ICD10-I70.0). NON-VASCULAR 1. Inflammation at the pancreaticoduodenal groove which could be from pancreatitis or peptic ulcer disease, please correlate with labs. 2. Chronic bilateral pleural effusion with atelectasis. 3. 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 4. Extensive right renal cortical scarring. Electronically Signed   By: Monte Fantasia M.D.   On: 01/15/2020 05:00               LOS: 1 day   Wing Schoch  [facility]   Pager on [email or web address]. If 7PM-7AM, please contact night-coverage at [email or web address]     01/16/2020, 10:42 AM

## 2020-01-16 NOTE — Plan of Care (Signed)

## 2020-01-16 NOTE — Progress Notes (Signed)
Dr. Mal Misty in room, aware patient had moderate black/red stool. Will continue to monitor

## 2020-01-16 NOTE — Progress Notes (Signed)
Dr. Allen Norris on the floor was made aware patient had a large bowel movement black/red in color. Per md no new orders. Will continue to monitor. Hemoglobin to be rechecked at 1700.

## 2020-01-16 NOTE — Progress Notes (Signed)
Mobility Specialist - Progress Note   01/16/20 1304  Mobility  Activity Contraindicated/medical hold  Mobility performed by Mobility specialist    Per chart review and discussion w/ nurse, pt currently on strict bed rest and receiving blood transfusion. Will hold and re-attempt session when pt is medically appropriate.     Pasqual Farias Mobility Specialist  01/16/20, 1:05 PM

## 2020-01-17 LAB — CBC WITH DIFFERENTIAL/PLATELET
Abs Immature Granulocytes: 0.03 10*3/uL (ref 0.00–0.07)
Basophils Absolute: 0 10*3/uL (ref 0.0–0.1)
Basophils Relative: 1 %
Eosinophils Absolute: 0.3 10*3/uL (ref 0.0–0.5)
Eosinophils Relative: 5 %
HCT: 25 % — ABNORMAL LOW (ref 39.0–52.0)
Hemoglobin: 7.9 g/dL — ABNORMAL LOW (ref 13.0–17.0)
Immature Granulocytes: 1 %
Lymphocytes Relative: 11 %
Lymphs Abs: 0.6 10*3/uL — ABNORMAL LOW (ref 0.7–4.0)
MCH: 27.4 pg (ref 26.0–34.0)
MCHC: 31.6 g/dL (ref 30.0–36.0)
MCV: 86.8 fL (ref 80.0–100.0)
Monocytes Absolute: 0.5 10*3/uL (ref 0.1–1.0)
Monocytes Relative: 9 %
Neutro Abs: 4.5 10*3/uL (ref 1.7–7.7)
Neutrophils Relative %: 73 %
Platelets: 188 10*3/uL (ref 150–400)
RBC: 2.88 MIL/uL — ABNORMAL LOW (ref 4.22–5.81)
RDW: 19.7 % — ABNORMAL HIGH (ref 11.5–15.5)
WBC: 6 10*3/uL (ref 4.0–10.5)
nRBC: 0 % (ref 0.0–0.2)

## 2020-01-17 LAB — PREPARE RBC (CROSSMATCH)

## 2020-01-17 MED ORDER — SODIUM CHLORIDE 0.9% IV SOLUTION: Freq: Once | INTRAVENOUS | Status: DC

## 2020-01-17 NOTE — Progress Notes (Signed)
OT Cancellation Note  Patient Details Name: Thomas [name] MRN: [id number] DOB: June 09, 1934   Cancelled Treatment:    Reason Eval/Treat Not Completed: OT screened, no needs identified, will sign off. Thank you for the OT consult. Order received and chart reviewed. This Chief Strategy Officer met with pt in room, [place] Interpreter utilized t/o conversation (Interpreter ID [phone]). Pt endorses feeling back to baseline level of functional independence for ADL management. He endorses using the room bathroom independently, he is having no difficulty with self-feeding or grooming tasks and denies functional deficits at this time. No skilled OT needs identified. Will complete order. Please re-consult if additional OT needs arise during this admission.   Shara Blazing, M.S., OTR/L Ascom: [phone] 01/17/20, 1:35 PM

## 2020-01-17 NOTE — Progress Notes (Addendum)
Mobility Specialist - Progress Note   01/17/20 1226  Mobility  Activity Ambulated in room;Dangled on edge of bed;Transferred:  Bed to chair  Level of Assistance Standby assist, set-up cues, supervision of patient - no hands on (CGA for safety and steadying during ambulation)  Assistive Device [employer] Ambulated (ft) 25 ft  Mobility Response Tolerated well  Mobility performed by Mobility specialist  $Mobility charge 1 Mobility   Pt laying in bed upon arrival. VRI pole utilized for interpretation: Luellen Pucker, [place] #[id number]. Pt agreed to session. Pt independent w/ bed mobility. Pt S2S independently. SBA for safety. Pt ambulated 20' total in room using his personal cane w/ SBA. CGA utilized for steadying and safety. Noted LOB when increasing pace. Pt encouraged to take his time ambulating. O2 sat > 90% t/o session. No c/o pain or SOB. Once returning to bed, pt requested to be left sitting EOB. Mobility specialist suggested pt sit on recliner instead. Pt agreed. Pt transferred from bed to chair independently w/o AD. No LOB noted. Overall, pt tolerated session very well. Pt pleasant t/o session. Requests to ambulate later again. Pt left sitting on recliner w/ alarm set. All needs placed in reach. Nurse was notified.    Jenet Durio Mobility Specialist  01/17/20, 12:32 PM

## 2020-01-17 NOTE — Progress Notes (Signed)
PROGRESS NOTE    Thomas [name]  OHY:[id number] DOB: 12-19-1934 DOA: 01/15/2020 PCP: [name], MD    Assessment & Plan:   Principal Problem:   GI bleed Active Problems:   Permanent atrial fibrillation (HCC)   Benign essential HTN   CAD S/P multiple PCIs   Bilateral deafness   CRI (chronic renal insufficiency), stage 3 (moderate) (HCC)   Acute blood loss anemia   Duodenal arteriovenous malformation   Chronic diastolic CHF (congestive heart failure) ([place])   Duodenal ulceration  Acute upper GI bleeding: s/p EGD on 01/15/2020 which showed normal esophagus, normal stomach and nonbleeding duodenal ulcers with no stigmata of bleeding. Continue IV Protonix. Small amount of blood in stool today as per pt.   Acute blood loss anemia: s/p transfusion with 3 units of pRBCs. Will continue to monitor H&H   CAD: s/p CABG. Continue to hold aspirin  HLD: continue statin   Chronic diastolic CHF: appears euvolemic. Continue to hold aldactone, torsemide and losartan.  HTN: will continue to hold aldactone, torsemide & losartan secondary to low normal BP   Permanent atrial fibrillation: not on long-term anticoagulation because of history of GI bleed. Not on any rate controlling meds today   CKDIIIa: Cr is labile. Will continue to monitor   Deafness: communicated via writing   DVT prophylaxis: SCDs Code Status: full  Family Communication: called pt's wife no answer so I left a voicemail  Disposition Plan: likely d/c back home  Status is: Inpatient  Remains inpatient appropriate because:Ongoing diagnostic testing needed not appropriate for outpatient work up and IV treatments appropriate due to intensity of illness or inability to take PO   Dispo: The patient is from: Home              Anticipated d/c is to: Home              Anticipated d/c date is: 3 days              Patient currently is not medically stable to d/c.     Consultants:     Procedures: EGD showed  duodenal ulcers    Antimicrobials:   Subjective: Pt c/o blood in stool today, minimal.   Objective: Vitals:   01/16/20 2320 01/17/20 0007 01/17/20 0225 01/17/20 0410  BP: 105/61 (!) 108/58 107/63 109/62  Pulse: 70 70 69 70  Resp: 18 18 18 17   Temp: 97.7 F (36.5 C) 97.8 F (36.6 C) 98.7 F (37.1 C) 97.6 F (36.4 C)  TempSrc: Oral Oral Oral Oral  SpO2: 97% 96% 98% 97%  Weight:    83.1 kg  Height:        Intake/Output Summary (Last 24 hours) at 01/17/2020 0811 Last data filed at 01/17/2020 9485 Gross per 24 hour  Intake 1838.27 ml  Output 810 ml  Net 1028.27 ml   Filed Weights   01/15/20 1527 01/16/20 0502 01/17/20 0410  Weight: 81.7 kg 83.4 kg 83.1 kg    Examination:  General exam: Appears calm and comfortable  Respiratory system: Clear to auscultation. Respiratory effort normal. Cardiovascular system: S1 & S2+. No rubs, gallops or clicks.  Gastrointestinal system: Abdomen is nondistended, soft and nontender.  Normal bowel sounds heard. Central nervous system: Alert and oriented. Moves all 4 extremities  Psychiatry: Judgement and insight appear normal. Mood & affect appropriate.     Data Reviewed: I have personally reviewed following labs and imaging studies  CBC: Recent Labs  Lab 01/15/20 0256 01/15/20 1736 01/16/20 4627  01/16/20 1844 01/17/20 0505  WBC 6.7  --  5.8  --  6.0  NEUTROABS 5.4  --   --   --  4.5  HGB 6.2* 7.1* 7.2* 7.5* 7.9*  HCT 20.6* 22.8* 22.9* 23.9* 25.0*  MCV 83.7  --  84.5  --  86.8  PLT 244  --  211  --  875   Basic Metabolic Panel: Recent Labs  Lab 01/15/20 0256 01/16/20 0514  NA 141 139  K 5.3* 4.5  CL 111 109  CO2 26 23  GLUCOSE 117* 94  BUN 31* 25*  CREATININE 1.17 1.26*  CALCIUM 8.3* 8.3*   GFR: Estimated Creatinine Clearance: 52.6 mL/min (A) (by C-G formula based on SCr of 1.26 mg/dL (H)). Liver Function Tests: Recent Labs  Lab 01/15/20 0256  AST 18  ALT 11  ALKPHOS 121  BILITOT 0.6  PROT 5.1*  ALBUMIN  2.2*   No results for input(s): LIPASE, AMYLASE in the last 168 hours. No results for input(s): AMMONIA in the last 168 hours. Coagulation Profile: Recent Labs  Lab 01/15/20 0301  INR 1.3*   Cardiac Enzymes: No results for input(s): CKTOTAL, CKMB, CKMBINDEX, TROPONINI in the last 168 hours. BNP (last 3 results) No results for input(s): PROBNP in the last 8760 hours. HbA1C: No results for input(s): HGBA1C in the last 72 hours. CBG: No results for input(s): GLUCAP in the last 168 hours. Lipid Profile: No results for input(s): CHOL, HDL, LDLCALC, TRIG, CHOLHDL, LDLDIRECT in the last 72 hours. Thyroid Function Tests: No results for input(s): TSH, T4TOTAL, FREET4, T3FREE, THYROIDAB in the last 72 hours. Anemia Panel: No results for input(s): VITAMINB12, FOLATE, FERRITIN, TIBC, IRON, RETICCTPCT in the last 72 hours. Sepsis Labs: No results for input(s): PROCALCITON, LATICACIDVEN in the last 168 hours.  Recent Results (from the past 240 hour(s))  Respiratory Panel by RT PCR (Flu A&B, Covid) - Nasopharyngeal Swab     Status: None   Collection Time: 01/15/20  4:52 AM   Specimen: Nasopharyngeal Swab  Result Value Ref Range Status   SARS Coronavirus 2 by RT PCR NEGATIVE NEGATIVE Final    Comment: (NOTE) SARS-CoV-2 target nucleic acids are NOT DETECTED.  The SARS-CoV-2 RNA is generally detectable in upper respiratoy specimens during the acute phase of infection. The lowest concentration of SARS-CoV-2 viral copies this assay can detect is 131 copies/mL. A negative result does not preclude SARS-Cov-2 infection and should not be used as the sole basis for treatment or other patient management decisions. A negative result may occur with  improper specimen collection/handling, submission of specimen other than nasopharyngeal swab, presence of viral mutation(s) within the areas targeted by this assay, and inadequate number of viral copies (<131 copies/mL). A negative result must be  combined with clinical observations, patient history, and epidemiological information. The expected result is Negative.  Fact Sheet for Patients:  PinkCheek.be  Fact Sheet for Healthcare Providers:  GravelBags.it  This test is no t yet approved or cleared by the [place] FDA and  has been authorized for detection and/or diagnosis of SARS-CoV-2 by FDA under an Emergency Use Authorization (EUA). This EUA will remain  in effect (meaning this test can be used) for the duration of the COVID-19 declaration under Section 564(b)(1) of the Act, 21 U.S.C. section 360bbb-3(b)(1), unless the authorization is terminated or revoked sooner.     Influenza A by PCR NEGATIVE NEGATIVE Final   Influenza B by PCR NEGATIVE NEGATIVE Final    Comment: (NOTE) The Xpert  Xpress SARS-CoV-2/FLU/RSV assay is intended as an aid in  the diagnosis of influenza from Nasopharyngeal swab specimens and  should not be used as a sole basis for treatment. Nasal washings and  aspirates are unacceptable for Xpert Xpress SARS-CoV-2/FLU/RSV  testing.  Fact Sheet for Patients: PinkCheek.be  Fact Sheet for Healthcare Providers: GravelBags.it  This test is not yet approved or cleared by the [place] FDA and  has been authorized for detection and/or diagnosis of SARS-CoV-2 by  FDA under an Emergency Use Authorization (EUA). This EUA will remain  in effect (meaning this test can be used) for the duration of the  Covid-19 declaration under Section 564(b)(1) of the Act, 21  U.S.C. section 360bbb-3(b)(1), unless the authorization is  terminated or revoked. Performed at [facility], [address] [place], [place] [address]          Radiology Studies: No results found.      Scheduled Meds: . sodium chloride   Intravenous Once  . sodium chloride   Intravenous Once  .  pantoprazole (PROTONIX) IV  40 mg Intravenous Q12H  . simvastatin  20 mg Oral Daily  . sodium chloride flush  3 mL Intravenous Q12H   Continuous Infusions: . sodium chloride       LOS: 2 days    Time spent: 32 mins     Wyvonnia Dusky, MD [facility] Pager [phone]  If 7PM-7AM, please contact night-coverage [email or web address] 01/17/2020, 8:11 AM

## 2020-01-18 ENCOUNTER — Encounter: Payer: Self-pay | Admitting: Certified Registered"

## 2020-01-18 ENCOUNTER — Other Ambulatory Visit (INDEPENDENT_AMBULATORY_CARE_PROVIDER_SITE_OTHER): Payer: Self-pay | Admitting: Vascular Surgery

## 2020-01-18 ENCOUNTER — Encounter
Admission: EM | Disposition: A | Discharge status: Home Health | Payer: Self-pay | Source: Home / Self Care | Attending: Internal Medicine

## 2020-01-18 DIAGNOSIS — I4821 Permanent atrial fibrillation: Secondary | ICD-10-CM

## 2020-01-18 DIAGNOSIS — K922 Gastrointestinal hemorrhage, unspecified: Secondary | ICD-10-CM

## 2020-01-18 HISTORY — PX: EMBOLIZATION: CATH118239

## 2020-01-18 LAB — CBC
HCT: 20.5 % — ABNORMAL LOW (ref 39.0–52.0)
Hemoglobin: 6.4 g/dL — ABNORMAL LOW (ref 13.0–17.0)
MCH: 27.2 pg (ref 26.0–34.0)
MCHC: 31.2 g/dL (ref 30.0–36.0)
MCV: 87.2 fL (ref 80.0–100.0)
Platelets: 178 10*3/uL (ref 150–400)
RBC: 2.35 MIL/uL — ABNORMAL LOW (ref 4.22–5.81)
RDW: 19.9 % — ABNORMAL HIGH (ref 11.5–15.5)
WBC: 6.3 10*3/uL (ref 4.0–10.5)
nRBC: 0 % (ref 0.0–0.2)

## 2020-01-18 LAB — BASIC METABOLIC PANEL
Anion gap: 5 (ref 5–15)
BUN: 29 mg/dL — ABNORMAL HIGH (ref 8–23)
CO2: 23 mmol/L (ref 22–32)
Calcium: 7.7 mg/dL — ABNORMAL LOW (ref 8.9–10.3)
Chloride: 109 mmol/L (ref 98–111)
Creatinine, Ser: 1.28 mg/dL — ABNORMAL HIGH (ref 0.61–1.24)
GFR, Estimated: 56 mL/min — ABNORMAL LOW (ref >60)
Glucose, Bld: 109 mg/dL — ABNORMAL HIGH (ref 70–99)
Potassium: 4.5 mmol/L (ref 3.5–5.1)
Sodium: 137 mmol/L (ref 135–145)

## 2020-01-18 LAB — HEMOGLOBIN AND HEMATOCRIT, BLOOD
HCT: 23.5 % — ABNORMAL LOW (ref 39.0–52.0)
Hemoglobin: 7.5 g/dL — ABNORMAL LOW (ref 13.0–17.0)

## 2020-01-18 SURGERY — EMBOLIZATION
Anesthesia: Moderate Sedation

## 2020-01-18 MED ORDER — FAMOTIDINE 20 MG PO TABS: 40.0000 mg | ORAL_TABLET | Freq: Once | ORAL | Status: DC | PRN

## 2020-01-18 MED ORDER — MIDAZOLAM HCL 2 MG/ML PO SYRP: 8.0000 mg | ORAL_SOLUTION | Freq: Once | ORAL | Status: DC | PRN

## 2020-01-18 MED ORDER — SODIUM CHLORIDE 0.9% IV SOLUTION: Freq: Once | INTRAVENOUS | Status: AC

## 2020-01-18 MED ORDER — CEFAZOLIN SODIUM-DEXTROSE 1-4 GM/50ML-% IV SOLN
INTRAVENOUS | Status: AC
  Administered 2020-01-18: 1 g via INTRAVENOUS
  Filled 2020-01-18: qty 50

## 2020-01-18 MED ORDER — CEFAZOLIN SODIUM-DEXTROSE 1-4 GM/50ML-% IV SOLN: 1.0000 g | Freq: Once | INTRAVENOUS | Status: AC

## 2020-01-18 MED ORDER — HYDROMORPHONE HCL 1 MG/ML IJ SOLN
1.0000 mg | Freq: Once | INTRAMUSCULAR | Status: AC | PRN
  Administered 2020-01-19: 1 mg via INTRAVENOUS
  Filled 2020-01-18: qty 1

## 2020-01-18 MED ORDER — FENTANYL CITRATE (PF) 100 MCG/2ML IJ SOLN
INTRAMUSCULAR | Status: DC | PRN
Start: 2020-01-18 — End: 2020-01-18
  Administered 2020-01-18: 50 ug via INTRAVENOUS
  Administered 2020-01-18: 25 ug via INTRAVENOUS

## 2020-01-18 MED ORDER — METHYLPREDNISOLONE SODIUM SUCC 125 MG IJ SOLR: 125.0000 mg | Freq: Once | INTRAMUSCULAR | Status: DC | PRN

## 2020-01-18 MED ORDER — ONDANSETRON HCL 4 MG/2ML IJ SOLN: 4.0000 mg | Freq: Four times a day (QID) | INTRAMUSCULAR | Status: DC | PRN

## 2020-01-18 MED ORDER — SODIUM CHLORIDE 0.9 % IV SOLN: INTRAVENOUS | Status: DC

## 2020-01-18 MED ORDER — FENTANYL CITRATE (PF) 100 MCG/2ML IJ SOLN
INTRAMUSCULAR | Status: AC
  Filled 2020-01-18: qty 2

## 2020-01-18 MED ORDER — DIPHENHYDRAMINE HCL 50 MG/ML IJ SOLN: 50.0000 mg | Freq: Once | INTRAMUSCULAR | Status: DC | PRN

## 2020-01-18 MED ORDER — SODIUM CHLORIDE 0.9 % IV BOLUS
500.0000 mL | Freq: Once | INTRAVENOUS | Status: AC
  Administered 2020-01-18: 500 mL via INTRAVENOUS

## 2020-01-18 MED ORDER — MIDAZOLAM HCL 5 MG/5ML IJ SOLN
INTRAMUSCULAR | Status: AC
  Filled 2020-01-18: qty 5

## 2020-01-18 MED ORDER — MIDAZOLAM HCL 2 MG/2ML IJ SOLN
INTRAMUSCULAR | Status: DC | PRN
  Administered 2020-01-18: 2 mg via INTRAVENOUS
  Administered 2020-01-18: 1 mg via INTRAVENOUS

## 2020-01-18 SURGICAL SUPPLY — 16 items
BLOCK BEAD 500-700 (Vascular Products) ×3 IMPLANT
CATH ANGIO 5F PIGTAIL 65CM (CATHETERS) ×3 IMPLANT
CATH MICROCATH PRGRT 2.8F 110 (CATHETERS) ×1 IMPLANT
CATH VS15FR (CATHETERS) ×3 IMPLANT
COIL 400 COMPLEX STD 4X30CM (Vascular Products) ×3 IMPLANT
COIL 400 COMPLEX STD 4X35CM (Vascular Products) ×3 IMPLANT
COIL 400 COMPLEX STD 5X20CM (Vascular Products) ×3 IMPLANT
DEVICE OCCLUSION PODJ15 (Vascular Products) ×1 IMPLANT
DEVICE STARCLOSE SE CLOSURE (Vascular Products) ×3 IMPLANT
GLIDEWIRE STIFF .35X180X3 HYDR (WIRE) ×3 IMPLANT
HANDLE DETACHMENT COIL (MISCELLANEOUS) ×3 IMPLANT
MICROCATH PROGREAT 2.8F 110 CM (CATHETERS) ×3
OCCLUSION DEVICE PODJ15 (Vascular Products) ×3 IMPLANT
PACK ANGIOGRAPHY (CUSTOM PROCEDURE TRAY) ×3 IMPLANT
SHEATH BRITE TIP 5FRX11 (SHEATH) ×3 IMPLANT
WIRE J 3MM .035X145CM (WIRE) ×3 IMPLANT

## 2020-01-18 NOTE — Progress Notes (Signed)
Mobility Specialist - Progress Note   01/18/20 1024  Mobility  Activity Contraindicated/medical hold  Mobility performed by Mobility specialist    Per chart review, pt's Hgb is 6.4. Hgb value falls out of safety guidelines for mobility session. Will hold and re-attempt when pt is medically appropriate.    Karel Turpen Mobility Specialist  01/18/20, 10:26 AM

## 2020-01-18 NOTE — Progress Notes (Signed)
PT Cancellation Note  Patient Details Name: Thomas [name] MRN: [id number] DOB: 06/09/1934   Cancelled Treatment:    Reason Eval/Treat Not Completed: Patient not medically ready Pt had drop in hemoglobin >1 point down to 6.4 last night.  Spoke with nursing who agrees with holding PT at this time.    Kreg Shropshire, DPT 01/18/2020, 9:25 AM

## 2020-01-18 NOTE — Consult Note (Signed)
Subjective:   CC: upper GI bleed  HPI:  Thomas [name] is a 85 y.o. male who was consulted by [name] for evaluation of above.  Admitted couple days ago for anemia.  EGD showed large duodenal ulcers but no sign of active bleeding.  Hx of bleeding in the past.  S/p 3u pRBC transfusion but still dropping hgb with very large maroon stool this am as well.  Currently getting another unit of blood.   Past Medical History:  has a past medical history of AICD (automatic cardioverter/defibrillator) present, Bacteremia due to group B Streptococcus, Biventricular ICD (implantable cardioverter-defibrillator) in place (03/24/2005), CHF (congestive heart failure) ([place]), CKD (chronic kidney disease), stage III ([place]), Coronary artery disease, Deaf, Dysrhythmia, History of abdominal aortic aneurysm, History of bleeding peptic ulcer (1980), History of epididymitis (2013), HTN (hypertension), Hydronephrosis with ureteropelvic junction obstruction, Hydroureter on left (2009), Hypertension, Ischemic cardiomyopathy, Moderate tricuspid regurgitation, PAF (paroxysmal atrial fibrillation) ([place]), Presence of permanent cardiac pacemaker (2002), Prostate cancer [facility]), Status post coronary artery bypass grafting (1986), and Testicular swelling.  Past Surgical History:  has a past surgical history that includes Coronary artery bypass graft (1986); Insert / replace / remove pacemaker; Transurethral resection of prostate; Cardiac catheterization (12/10/2011); 2-D echocardiogram (11/20/2011); Persantine Myoview (05/06/2010); left heart catheterization with coronary/graft angiogram (N/A, 12/10/2011); bi-ventricular implantable cardioverter defibrillator (N/A, 12/16/2012); left heart catheterization with coronary/graft angiogram (N/A, 09/25/2013); Cataract extraction w/PHACO (Right, 10/12/2017); Esophagogastroduodenoscopy (egd) with propofol (N/A, 05/27/2018); Esophagogastroduodenoscopy (N/A, 07/13/2018); Colonoscopy (N/A, 07/13/2018);  Esophagogastroduodenoscopy (N/A, 09/14/2018); enteroscopy (N/A, 09/14/2018); Esophagogastroduodenoscopy (N/A, 06/21/2019); TEE without cardioversion (N/A, 06/22/2019); enteroscopy (N/A, 08/22/2019); TEE without cardioversion (N/A, 08/23/2019); Icd lead removal (Left, 08/25/2019); TEE without cardioversion (N/A, 08/25/2019); Multiple extractions with alveoloplasty (Bilateral, 08/30/2019); BIV ICD INSERTION CRT-D (N/A, 09/01/2019); and Esophagogastroduodenoscopy (N/A, 01/15/2020).  Family History: family history includes Hypertension in his father.  Social History:  reports that he quit smoking about 34 years ago. He has never used smokeless tobacco. He reports that he does not drink alcohol and does not use drugs.  Current Medications:  Prior to Admission medications   Medication Sig Start Date End Date Taking? Authorizing Provider  losartan (COZAAR) 25 MG tablet Take 25 mg by mouth daily.   Yes [provider]  midodrine (PROAMATINE) 5 MG tablet TAKE 1 TABLET (5 MG TOTAL) BY MOUTH 3 (THREE) TIMES DAILY WITH MEALS. 01/08/20  Yes [name], MD  Multiple Vitamin (MULTIVITAMIN WITH MINERALS) TABS tablet Take 1 tablet by mouth daily. 05/29/19  Yes Nicole Kindred A, DO  Nystatin (GERHARDT'S BUTT CREAM) CREA Apply 1 application topically 2 (two) times daily. Patient taking differently: Apply 1 application topically as needed.  09/11/19  Yes Mercy Riding, MD  simvastatin (ZOCOR) 20 MG tablet TAKE 1 TABLET BY MOUTH EVERY DAY 04/07/19  Yes Kilroy, Luke K, PA-C  torsemide (DEMADEX) 20 MG tablet Take 2 tablets (40 mg total) by mouth 2 (two) times daily. 09/11/19  Yes Mercy Riding, MD  acetaminophen (TYLENOL) 325 MG tablet Take 2 tablets (650 mg total) by mouth every 6 (six) hours as needed for mild pain or fever. 08/23/19   Loletha Grayer, MD  albuterol (PROVENTIL) (2.5 MG/3ML) 0.083% nebulizer solution Inhale 3 mLs into the lungs every 4 (four) hours as needed for wheezing or shortness of breath. 08/23/19    Loletha Grayer, MD  ferrous sulfate 325 (65 FE) MG tablet Take 1 tablet (325 mg total) by mouth 2 (two) times daily with a meal. Patient taking differently: Take 325 mg  by mouth daily with breakfast.  09/11/19 12/05/19  Mercy Riding, MD  ondansetron (ZOFRAN) 4 MG tablet Take 1 tablet (4 mg total) by mouth every 8 (eight) hours as needed for nausea or vomiting. 09/12/19 09/11/20  Mercy Riding, MD  pantoprazole (PROTONIX) 40 MG tablet Take 1 tablet (40 mg total) by mouth 2 (two) times daily for 14 days, THEN 1 tablet (40 mg total) daily. Patient not taking: Reported on 12/05/2019 09/12/19 12/11/19  Mercy Riding, MD  spironolactone (ALDACTONE) 25 MG tablet Take 1 tablet (25 mg total) by mouth daily. 08/01/19 12/05/19  Alisa Graff, FNP    Allergies:  Allergies  Allergen Reactions  . Entresto [Sacubitril-Valsartan] Swelling    And bruising of arm  . Phenazopyridine Nausea Only and Other (See Comments)    GI UPSET  . Ramipril Other (See Comments)    unk Other reaction(s): Other (See Comments), Unknown unk    ROS:  General: Denies weight loss, weight gain, fatigue, fevers, chills, and night sweats. Eyes: Denies blurry vision, double vision, eye pain, itchy eyes, and tearing. Ears: Denies hearing loss, earache, and ringing in ears. Nose: Denies sinus pain, congestion, infections, runny nose, and nosebleeds. Mouth/throat: Denies hoarseness, sore throat, bleeding gums, and difficulty swallowing. Heart: Denies chest pain, palpitations, racing heart, irregular heartbeat, leg pain or swelling, and decreased activity tolerance. Respiratory: Denies breathing difficulty, shortness of breath, wheezing, cough, and sputum. GI: Denies change in appetite, heartburn, nausea, vomiting, constipation, diarrhea, and blood in stool. GU: Denies difficulty urinating, pain with urinating, urgency, frequency, blood in urine. Musculoskeletal: Denies joint stiffness, pain, swelling, muscle weakness. Skin: Denies  rash, itching, mass, tumors, sores, and boils Neurologic: Denies headache, fainting, dizziness, seizures, numbness, and tingling. Psychiatric: Denies depression, anxiety, difficulty sleeping, and memory loss. Endocrine: Denies heat or cold intolerance, and increased thirst or urination. Blood/lymph: Denies easy bruising, easy bruising, and swollen glands     Objective:     BP (!) 93/55 (BP Location: Left Arm)   Pulse 70   Temp 98.3 F (36.8 C) (Oral)   Resp 19   Ht 6\' 2"  (1.88 m)   Wt 82.2 kg   SpO2 97%   BMI 23.26 kg/m   Constitutional :  alert, cooperative, appears stated age and no distress  Lymphatics/Throat:  no asymmetry, masses, or scars  Respiratory:  clear to auscultation bilaterally  Cardiovascular:  regular rate and rhythm  Gastrointestinal: soft, non-tender; bowel sounds normal; no masses,  no organomegaly.  Musculoskeletal: Steady gait and movement  Skin: Cool and moist  Psychiatric: Normal affect, non-agitated, not confused       LABS:  CMP Latest Ref Rng & Units 01/18/2020 01/16/2020 01/15/2020  Glucose 70 - 99 mg/dL 109(H) 94 117(H)  BUN 8 - 23 mg/dL 29(H) 25(H) 31(H)  Creatinine 0.61 - 1.24 mg/dL 1.28(H) 1.26(H) 1.17  Sodium 135 - 145 mmol/L 137 139 141  Potassium 3.5 - 5.1 mmol/L 4.5 4.5 5.3(H)  Chloride 98 - 111 mmol/L 109 109 111  CO2 22 - 32 mmol/L 23 23 26   Calcium 8.9 - 10.3 mg/dL 7.7(L) 8.3(L) 8.3(L)  Total Protein 6.5 - 8.1 g/dL - - 5.1(L)  Total Bilirubin 0.3 - 1.2 mg/dL - - 0.6  Alkaline Phos 38 - 126 U/L - - 121  AST 15 - 41 U/L - - 18  ALT 0 - 44 U/L - - 11   CBC Latest Ref Rng & Units 01/18/2020 01/17/2020 01/16/2020  WBC 4.0 - 10.5 K/uL 6.3  6.0 -  Hemoglobin 13.0 - 17.0 g/dL 6.4(L) 7.9(L) 7.5(L)  Hematocrit 39 - 52 % 20.5(L) 25.0(L) 23.9(L)  Platelets 150 - 400 K/uL 178 188 -    RADS: n/a  Assessment:      Upper GI bleed, recurrent after EGD showing large duodenal ulcers not amenable to any endoscopic intervention  Plan:      Recommend vascular consult for possible emoblization prior to consideration of open surgical repair, which carries high morbidity and mortality with patient.  Discussed case with Dr. Lucky Cowboy, vascular surgery and agreeable to consultation.  Surgery will be on standby as needed.  Time of notification 1000, Pt seen and examined, 1030

## 2020-01-18 NOTE — Consult Note (Signed)
[facility] Vascular Consult Note  MRN : [id number]  Thomas [name] is a 85 y.o. (1934-09-11) male who presents with chief complaint of  Chief Complaint  Patient presents with  . GI Bleeding   History of Present Illness:  Thomas [name] is a 85 y.o. male with medical history significant for impaired hearing, CAD, high-grade AVB s/p BiV-ICD, systolic CHF with recovered EF, CKD-3A, A. fib not on AC due to GI bleed/duodenal AVM, lymphedema, peptic ulcer disease, prostate cancer and history of group B strep bacteremia s/p 6 weeks of antibiotic therapy and status post extraction of BiV-ICD system due to recurrent bacteremia. He had right-sided BiV-ICD insertion CRT-D on 6/18. He also underwent dental extraction involving tooth #2, 28 and 31 with 2 quadrants of alveoloplasty.  Patient was brought into the emergency room by EMS for evaluation of bleeding and was noted to have dark red blood stains on his clothes and lower extremities.  Patient states that he has a history of GI bleed and that he has had emesis containing blood at home as well as dark blood per rectum.  He also complains of pain in his right upper quadrant but denies having any fever or chills.  He complains of weakness and fatigue but denies having any chest pain or shortness of breath. He admits to occasional NSAID use for low back pain but states that he takes it with food. Labs reveal sodium 141, potassium 5.3, chloride 111, bicarb 26, glucose 117, BUN 31, creatinine 1.17, calcium 8.3, alkaline phosphatase 121, albumin 2.2, AST 18, ALT 11, white count 6.7, hemoglobin 6.2, hematocrit 20.6, MCV 83.7, RDW 20.9, PT 16, INR 1.3  On January 15, 2020 the patient underwent an endoscopy which is notable for a few non-bleeding cratered duodenal ulcers with no stigmata of bleeding were found in the duodenal bulb.  Status post endoscopy the patient continued to require blood products.  Team felt the patient was bleeding from  duodenal ulcerations noted on endoscopy.    Vascular surgery was consulted by Dr. Lysle Pearl for possible endovascular intervention.  Current Facility-Administered Medications  Medication Dose Route Frequency Provider Last Rate Last Admin  . ceFAZolin (ANCEF) 1-4 GM/50ML-% IVPB           . 0.9 %  sodium chloride infusion (Manually program via Guardrails IV Fluids)   Intravenous Once Jennye Boroughs, MD      . 0.9 %  sodium chloride infusion (Manually program via Guardrails IV Fluids)   Intravenous Once Jennye Boroughs, MD      . 0.9 %  sodium chloride infusion  250 mL Intravenous PRN Lucilla Lame, MD      . 0.9 %  sodium chloride infusion   Intravenous Continuous Greggory Safranek, Janalyn Harder, PA-C 75 mL/hr at 01/18/20 1205 New Bag at 01/18/20 1205  . albuterol (PROVENTIL) (2.5 MG/3ML) 0.083% nebulizer solution 3 mL  3 mL Inhalation Q4H PRN Lucilla Lame, MD      . Derrill Memo ON 01/19/2020] ceFAZolin (ANCEF) IVPB 1 g/50 mL premix  1 g Intravenous Once Doyle Tegethoff A, PA-C      . diphenhydrAMINE (BENADRYL) injection 50 mg  50 mg Intravenous Once PRN Waylon Hershey A, PA-C      . famotidine (PEPCID) tablet 40 mg  40 mg Oral Once PRN Shaleen Talamantez, Janalyn Harder, PA-C      . HYDROmorphone (DILAUDID) injection 1 mg  1 mg Intravenous Once PRN Osvaldo Lamping A, PA-C      . methylPREDNISolone sodium  succinate (SOLU-MEDROL) 125 mg/2 mL injection 125 mg  125 mg Intravenous Once PRN Wen Munford A, PA-C      . midazolam (VERSED) 2 MG/ML syrup 8 mg  8 mg Oral Once PRN Trayquan Kolakowski A, PA-C      . ondansetron (ZOFRAN) injection 4 mg  4 mg Intravenous Q6H PRN Mikeria Valin A, PA-C      . pantoprazole (PROTONIX) injection 40 mg  40 mg Intravenous Q12H Jennye Boroughs, MD   40 mg at 01/18/20 1024  . simvastatin (ZOCOR) tablet 20 mg  20 mg Oral Daily Jennye Boroughs, MD   20 mg at 01/18/20 1024  . sodium chloride flush (NS) 0.9 % injection 3 mL  3 mL Intravenous Q12H Lucilla Lame, MD   3 mL at 01/18/20  0958  . sodium chloride flush (NS) 0.9 % injection 3 mL  3 mL Intravenous PRN Lucilla Lame, MD       Past Medical History:  Diagnosis Date  . AICD (automatic cardioverter/defibrillator) present   . Bacteremia due to group B Streptococcus    Recurrent admissions for group B Strepotococcus bacteremia of unknown source with TEEs negative for vegetation 06/2019 and 08/2019  . Biventricular ICD (implantable cardioverter-defibrillator) in place 03/24/2005   Implantation of a Medtronic Adapta ADDRO1, serial number [id number]  . CHF (congestive heart failure) ([place])   . CKD (chronic kidney disease), stage III ([place])   . Coronary artery disease    a. s/p CABG 1986. b. Multiple PCIs/caths. c. 09/2013: s/p PTCA and BMS to SVG-OM.  [name]    Requires sign language interpreter  . Dysrhythmia   . History of abdominal aortic aneurysm   . History of bleeding peptic ulcer 1980  . History of epididymitis 2013  . HTN (hypertension)   . Hydronephrosis with ureteropelvic junction obstruction   . Hydroureter on left 2009  . Hypertension   . Ischemic cardiomyopathy    a. Prior EF 30-35%, s/p BIV-ICD. b. 09/2013: EF 45-50%.  . Moderate tricuspid regurgitation   . PAF (paroxysmal atrial fibrillation) (H[facility])    Not on [place] 2/2 GIB  . Presence of permanent cardiac pacemaker 2002   Original placed in 2002 for CHB then 2007 and 2014 device change out  . Prostate cancer ([place])   . Status post coronary artery bypass grafting 1986   LIMA to the LAD, SVG to OM, SVG to RCA  . Testicular swelling    Past Surgical History:  Procedure Laterality Date  . 2-D echocardiogram  11/20/2011   Ejection fraction 30-35% moderate concentric left ventricular hypertrophy. Left atrium is moderately dilated. Mild MR. Mild or  . BI-VENTRICULAR IMPLANTABLE CARDIOVERTER DEFIBRILLATOR N/A 12/16/2012   Procedure: BI-VENTRICULAR IMPLANTABLE CARDIOVERTER DEFIBRILLATOR  (CRT-D);  Surgeon: [name], MD;  Location: [facility] CATH LAB;  Service:  Cardiovascular;  Laterality: N/A;  . BIV ICD INSERTION CRT-D N/A 09/01/2019   Procedure: BIV ICD INSERTION CRT-D;  Surgeon: [name], MD;  Location: [place] CV LAB;  Service: Cardiovascular;  Laterality: N/A;  . CARDIAC CATHETERIZATION  12/10/2011   SVG to OM widely patent.  LIMA to LAD patent  . CATARACT EXTRACTION W/PHACO Right 10/12/2017   Procedure: CATARACT EXTRACTION PHACO AND INTRAOCULAR LENS PLACEMENT (IOC);  Surgeon: Birder Robson, MD;  Location: [facility] ORS;  Service: Ophthalmology;  Laterality: Right;  [place] 00:57 AP% 15.9 CDE 9.07 Fluid pack lot # [id number]  . COLONOSCOPY N/A 07/13/2018   Procedure: COLONOSCOPY;  Surgeon: Alice Reichert, Benay Pike, MD;  Location: [facility]  ENDOSCOPY;  Service: Gastroenterology;  Laterality: N/A;  . CORONARY ARTERY BYPASS GRAFT  1986  . ENTEROSCOPY N/A 09/14/2018   Procedure: ENTEROSCOPY;  Surgeon: Toledo, Benay Pike, MD;  Location: [facility] ENDOSCOPY;  Service: Gastroenterology;  Laterality: N/A;  symptomatic anemia, GI blood loss anemia, melena, positive small bowel capsule endoscopy showing source of bleeding   . ENTEROSCOPY N/A 08/22/2019   Procedure: ENTEROSCOPY;  Surgeon: Lin Landsman, MD;  Location: [facility] ENDOSCOPY;  Service: Gastroenterology;  Laterality: N/A;  . ESOPHAGOGASTRODUODENOSCOPY N/A 07/13/2018   Procedure: ESOPHAGOGASTRODUODENOSCOPY (EGD);  Surgeon: Toledo, Benay Pike, MD;  Location: [facility] ENDOSCOPY;  Service: Gastroenterology;  Laterality: N/A;  . ESOPHAGOGASTRODUODENOSCOPY N/A 09/14/2018   Procedure: ESOPHAGOGASTRODUODENOSCOPY (EGD);  Surgeon: Toledo, Benay Pike, MD;  Location: [facility] ENDOSCOPY;  Service: Gastroenterology;  Laterality: N/A;  SIGN LANAGUAGE INTERPRETER  . ESOPHAGOGASTRODUODENOSCOPY N/A 06/21/2019   Procedure: ESOPHAGOGASTRODUODENOSCOPY (EGD);  Surgeon: Toledo, Benay Pike, MD;  Location: [facility] ENDOSCOPY;  Service: Gastroenterology;  Laterality: N/A;  . ESOPHAGOGASTRODUODENOSCOPY N/A 01/15/2020   Procedure: ESOPHAGOGASTRODUODENOSCOPY  (EGD);  Surgeon: Lucilla Lame, MD;  Location: [facility] ENDOSCOPY;  Service: Endoscopy;  Laterality: N/A;  . ESOPHAGOGASTRODUODENOSCOPY (EGD) WITH PROPOFOL N/A 05/27/2018   Procedure: ESOPHAGOGASTRODUODENOSCOPY (EGD) WITH PROPOFOL;  Surgeon: Clarene Essex, MD;  Location: [place];  Service: Endoscopy;  Laterality: N/A;  . ICD LEAD REMOVAL Left 08/25/2019   Procedure: ICD LEAD EXTRACTION;  Surgeon: [name], MD;  Location: [place];  Service: Cardiovascular;  Laterality: Left;  DR. Roxan Hockey BACK UP  . INSERT / REPLACE / REMOVE PACEMAKER    . LEFT HEART CATHETERIZATION WITH CORONARY/GRAFT ANGIOGRAM N/A 12/10/2011   Procedure: LEFT HEART CATHETERIZATION WITH Beatrix Fetters;  Surgeon: Sanda Klein, MD;  Location: [place] CATH LAB;  Service: Cardiovascular;  Laterality: N/A;  . LEFT HEART CATHETERIZATION WITH CORONARY/GRAFT ANGIOGRAM N/A 09/25/2013   Procedure: LEFT HEART CATHETERIZATION WITH Beatrix Fetters;  Surgeon: Blane Ohara, MD;  Location: [facility] CATH LAB;  Service: Cardiovascular;  Laterality: N/A;  . MULTIPLE EXTRACTIONS WITH ALVEOLOPLASTY Bilateral 08/30/2019   Procedure: Extractionof tooth #'s 2, 28, and 31 with alveoloplasty and gross debridement of remaining teeth.;  Surgeon: [name], DDS;  Location: [place];  Service: Oral Surgery;  Laterality: Bilateral;  . Persantine Myoview  05/06/2010   Post-rest ejection fraction 30%. No significant ischemia demonstrated. Compared to previous study there is no significant change.  . TEE WITHOUT CARDIOVERSION N/A 06/22/2019   Procedure: TRANSESOPHAGEAL ECHOCARDIOGRAM (TEE);  Surgeon: Minna Merritts, MD;  Location: [facility] ORS;  Service: Cardiovascular;  Laterality: N/A;  . TEE WITHOUT CARDIOVERSION N/A 08/23/2019   Procedure: TRANSESOPHAGEAL ECHOCARDIOGRAM (TEE);  Surgeon: Kate Sable, MD;  Location: [facility] ORS;  Service: Cardiovascular;  Laterality: N/A;  . TEE WITHOUT CARDIOVERSION N/A 08/25/2019   Procedure: TRANSESOPHAGEAL  ECHOCARDIOGRAM (TEE);  Surgeon: [name], MD;  Location: [facility] OR;  Service: Cardiovascular;  Laterality: N/A;  . TRANSURETHRAL RESECTION OF PROSTATE     s/p   Social History Social History   Tobacco Use  . Smoking status: Former Smoker    Quit date: 03/15/1985    Years since quitting: 34.8  . Smokeless tobacco: Never Used  Vaping Use  . Vaping Use: Never used  Substance Use Topics  . Alcohol use: No    Comment: occas.  . Drug use: No   Family History Family History  Problem Relation Age of Onset  . Hypertension Father   Denies family history of peripheral artery disease, venous disease or renal disease.  Allergies  Allergen Reactions  .  Entresto [Sacubitril-Valsartan] Swelling    And bruising of arm  . Phenazopyridine Nausea Only and Other (See Comments)    GI UPSET  . Ramipril Other (See Comments)    unk Other reaction(s): Other (See Comments), Unknown unk   REVIEW OF SYSTEMS (Negative unless checked)  Constitutional: [] Weight loss  [] Fever  [] Chills Cardiac: [] Chest pain   [] Chest pressure   [] Palpitations   [] Shortness of breath when laying flat   [] Shortness of breath at rest   [x] Shortness of breath with exertion. Vascular:  [] Pain in legs with walking   [] Pain in legs at rest   [] Pain in legs when laying flat   [] Claudication   [] Pain in feet when walking  [] Pain in feet at rest  [] Pain in feet when laying flat   [] History of DVT   [] Phlebitis   [] Swelling in legs   [] Varicose veins   [] Non-healing ulcers Pulmonary:   [] Uses home oxygen   [] Productive cough   [] Hemoptysis   [] Wheeze  [] COPD   [] Asthma Neurologic:  [] Dizziness  [] Blackouts   [] Seizures   [] History of stroke   [] History of TIA  [] Aphasia   [] Temporary blindness   [] Dysphagia   [] Weakness or numbness in arms   [] Weakness or numbness in legs Musculoskeletal:  [] Arthritis   [] Joint swelling   [] Joint pain   [] Low back pain Hematologic:  [] Easy bruising  [] Easy bleeding   [] Hypercoagulable state    [x] Anemic  [] Hepatitis Gastrointestinal:  [x] Blood in stool   [] Vomiting blood  [] Gastroesophageal reflux/heartburn   [] Difficulty swallowing. Genitourinary:  [] Chronic kidney disease   [] Difficult urination  [] Frequent urination  [] Burning with urination   [] Blood in urine Skin:  [] Rashes   [] Ulcers   [] Wounds Psychological:  [] History of anxiety   []  History of major depression.  Physical Examination  Vitals:   01/18/20 0953 01/18/20 0956 01/18/20 1008 01/18/20 1205  BP: (!) 96/50 (!) 99/53 (!) 93/55 (!) 104/56  Pulse: 72 69 70 70  Resp: 19  19 20   Temp: 98.6 F (37 C)  98.3 F (36.8 C) 98.8 F (37.1 C)  TempSrc: Oral  Oral Oral  SpO2: 98% 97% 97%   Weight:    82.2 kg  Height:    6\' 2"  (1.88 m)   Body mass index is 23.27 kg/m. Gen:  WD/WN, NAD Head: [place]/AT, No temporalis wasting. Prominent temp pulse not noted. Ear/Nose/Throat: Hearing grossly intact, nares w/o erythema or drainage, oropharynx w/o Erythema/Exudate Eyes: Sclera non-icteric, conjunctiva clear Neck: Trachea midline.  No JVD.  Pulmonary:  Good air movement, respirations not labored, equal bilaterally.  Cardiac: RRR, normal S1, S2. Vascular:  Vessel Right Left  Radial Palpable Palpable  Ulnar Palpable Palpable  Brachial Palpable Palpable  Carotid Palpable, without bruit Palpable, without bruit  Aorta Not palpable N/A  Femoral Palpable Palpable  Popliteal Palpable Palpable  PT Palpable Palpable  DP Palpable Palpable   Gastrointestinal: soft, non-tender/non-distended. No guarding/reflex.  Musculoskeletal: M/S 5/5 throughout.  Extremities without ischemic changes.  No deformity or atrophy. No edema. Neurologic: Sensation grossly intact in extremities.  Symmetrical.  Speech is fluent. Motor exam as listed above. Psychiatric: Judgment intact, Mood & affect appropriate for pt's clinical situation. Dermatologic: No rashes or ulcers noted.  No cellulitis or open wounds. Lymph : No Cervical, Axillary, or Inguinal  lymphadenopathy.  CBC Lab Results  Component Value Date   WBC 6.3 01/18/2020   HGB 6.4 (L) 01/18/2020   HCT 20.5 (L) 01/18/2020   MCV 87.2 01/18/2020  PLT 178 01/18/2020   BMET    Component Value Date/Time   NA 137 01/18/2020 0523   NA 146 (H) 03/28/2018 1625   NA 140 02/14/2014 0546   K 4.5 01/18/2020 0523   K 4.1 02/14/2014 0546   CL 109 01/18/2020 0523   CL 107 02/14/2014 0546   CO2 23 01/18/2020 0523   CO2 25 02/14/2014 0546   GLUCOSE 109 (H) 01/18/2020 0523   GLUCOSE 103 (H) 02/14/2014 0546   BUN 29 (H) 01/18/2020 0523   BUN 33 (H) 03/28/2018 1625   BUN 30 (H) 02/14/2014 0546   CREATININE 1.28 (H) 01/18/2020 0523   CREATININE 1.24 02/14/2014 0546   CALCIUM 7.7 (L) 01/18/2020 0523   CALCIUM 9.6 02/14/2014 0546   GFRNONAA 56 (L) 01/18/2020 0523   GFRNONAA >60 02/14/2014 0546   GFRNONAA 41 (L) 09/14/2012 2047   GFRAA 56 (L) 10/24/2019 1720   GFRAA >60 02/14/2014 0546   GFRAA 47 (L) 09/14/2012 2047   Estimated Creatinine Clearance: 51.7 mL/min (A) (by C-G formula based on SCr of 1.28 mg/dL (H)).  COAG Lab Results  Component Value Date   INR 1.3 (H) 01/15/2020   INR 1.3 (H) 08/24/2019   INR 1.6 (H) 08/18/2019   Radiology CT Angio Abd/Pel w/ and/or w/o  Result Date: 01/15/2020 CLINICAL DATA:  Acute nonlocalized abdominal pain.  GI bleeding EXAM: CTA ABDOMEN AND PELVIS WITHOUT AND WITH CONTRAST TECHNIQUE: Multidetector CT imaging of the abdomen and pelvis was performed using the standard protocol during bolus administration of intravenous contrast. Multiplanar reconstructed images and MIPs were obtained and reviewed to evaluate the vascular anatomy. CONTRAST:  119mL OMNIPAQUE IOHEXOL 350 MG/ML SOLN COMPARISON:  06/17/2019 abdomen and pelvis CT without contrast FINDINGS: VASCULAR Aorta: Stent grafting of the aorta and iliacs. No high-density hematoma seen within the wall and noncontrast phase. Well-opposed stent graft to the infrarenal aorta. No adjacent fat stranding  seen. Celiac: Heavily calcified branches from atherosclerosis. No acute finding. Proximal narrowing is approximately 50%. SMA: Diffuse atheromatous plaque. No branch occlusion or dissection seen. Renals: Accessory upper pole renal artery on the left. The renal arteries are heavily calcified. No aneurysm or occlusion seen. No detected beading. IMA: Poorly enhancing, being covered by the stent graft Inflow: Heavily calcified on both sides. 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 No significant change from prior. No superimposed dissection. Proximal Outflow: Diffuse atheromatous calcification Veins: No portal or systemic venous occlusion is seen. 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 Review of the MIP images confirms the above findings. NON-VASCULAR Lower chest: At least moderate bilateral pleural effusion with atelectasis. The heart is enlarged and there is a biventricular pacer. Hepatobiliary: 2.8 cm cystic density mass in the left lobe liver. No visible gallbladder inflammation or calcified stone Pancreas: Fat stranding and expansion around the pancreatic head. Spleen: Granulomatous calcifications Adrenals/Urinary Tract: Negative adrenals. Patchy right renal cortical scarring which is extensive. Renal hilar calcifications are atheromatous. Unremarkable bladder. Stomach/Bowel: No intraluminal hemorrhage is seen when accounting for intrinsic high-density bowel contents at the distal colon. Inflammation around the mid duodenum. There are a few colonic diverticula Lymphatic: Negative for adenopathy or mass Reproductive: Enlarged prostate, asymmetric to the left, unchanged. Fiducial markers are present in the prostate. Other: No ascites or pneumoperitoneum Musculoskeletal: Degenerative disease without acute finding IMPRESSION: VASCULAR 1. No emergent finding. 2. Aortoiliac stent grafting. 3.  Aortic Atherosclerosis (ICD10-I70.0). NON-VASCULAR 1.  Inflammation at the pancreaticoduodenal groove which could be from  pancreatitis or peptic ulcer disease, please correlate with labs. 2. Chronic bilateral pleural effusion with atelectasis. 3. 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 4. Extensive right renal cortical scarring. Electronically Signed   By: Monte Fantasia M.D.   On: 01/15/2020 05:00   Assessment/Plan The patient is an 85 year old with multiple medical issues with possible upper GI bleed requiring multiple transfusions presents for possible embolization.   1.  Possible Upper GI bleed: Patient underwent endoscopy on 01/19/20 - which was notable for duodenal ulcerations.  Over the next few days, patient has required multiple blood transfusions to treat anemia.  Recommend undergoing mesenteric angiography and attempt assess the patient's anatomy and location of bleeding if any.  If it is found which is amenable to embolization endovascularly we will attempt this.  Procedure, risks and benefits were explained to the patient to the use of an American sign language interpreter.  All questions were answered.  The patient wishes to proceed.  2.  Duodenal Ulcerations: Were not seen bleeding on recent endoscopy however could certainly be source of the patient's pneumonia.  3.  Atrial fibrillation: Anticoagulation has stopped in setting of anemia  Discussed with Dr. Mayme Genta, PA-C  01/18/2020 12:14 PM  This note was created with Dragon medical transcription system.  Any error is purely unintentional

## 2020-01-18 NOTE — Care Management Important Message (Signed)
Important Message  Patient Details  Name: Thomas [name] MRN: [id number] Date of Birth: 1934/08/24   Medicare Important Message Given:  Yes     Dannette Barbara 01/18/2020, 1:18 PM

## 2020-01-18 NOTE — Op Note (Signed)
[facility] Percutaneous Study/Intervention Procedural Note     Surgeon(s): [employer]  Assistants: none  Pre-operative Diagnosis: 1. Upper GI bleed, duodenal ulcer  Post-operative diagnosis: Same  Procedure(s) Performed: 1. Ultrasound guidance for vascular access right femoral artery 2. Catheter placement into celiac, hepatic and gastroduodenal artery 3. Aortogram and selective angiogram of the celiac artery, hepatic artery and gastroduodenal artery 4. Microbead embolization of the gastroduodenal artery with 2 cc of 500-700  polyvinyl alcohol beads.  5.  Coil embolization of the gastroduodenal artery beyond the duodenum into the pancreaticoduodenal artery and then back to the proximal gastroduodenal artery using a total of 4 Ruby coils 6. StarClose closure device right femoral artery  Anesthesia: Moderate conscious sedation for approximately 40 minutes using 3 mg of Versed and 75 mcg of Fentanyl  EBL: 2 cc  Fluoro Time: 4.8 minutes  Contrast: 50 cc  Indications: Patient is a 85 y.o.male with brisk lower GI bleeding with resultant anemia. The patient has an endoscopy showing a very large duodenal ulcer. The patient is brought in for angiography for further evaluation and potential treatment. Risks and benefits are discussed and informed consent is obtained  Procedure: The patient was identified and appropriate procedural time out was performed. The patient was then placed supine on the table and prepped and draped in the usual sterile fashion.Moderate conscious sedation was administered during a face to face encounter with the patient throughout the procedure with my supervision of the RN administering medicines and monitoring the patient's vital signs, pulse oximetry, telemetry and mental status throughout from the start of the procedure until the patient was taken  to the recovery room.  Ultrasound was used to evaluate the right common femoral artery. It was patent . A digital ultrasound image was acquired. A Seldinger needle was used to access the right common femoral artery under direct ultrasound guidance and a permanent image was performed. A 0.035 J wire was advanced without resistance and a 5Fr sheath was placed. Pigtail catheter was placed into the aorta and an AP aortogram was performed. This demonstrated normal renal arteries.  There was a patent stent graft in place from his aneurysm repair.  There was still some aneurysmal degeneration but no endoleak was seen.  There was good flow in the celiac and SMA.  A V S1 catheter was used to selectively cannulate the celiac artery.  This demonstrated a large splenic artery going laterally with a long common hepatic artery.  I then advanced a prograde microcatheter through the V S1 cath and into the hepatic artery where imaging was performed.  This showed the gastroduodenal artery coming off slightly more lateral than typical but clearly going down towards the duodenum with a continuation of the hepatic artery up to the liver.  Using this is a roadmap, the prograde microcatheter was then advanced out into the gastroduodenal artery where imaging showed brisk flow into the duodenum with a large continuation into the pancreaticoduodenal artery beyond the duodenum.  At this point, I gave 2 cc of 500 to 700 m polyvinyl alcohol beads into the area and then advanced the prograde microcatheter out to the large pancreaticoduodenal artery distal to the duodenum and began coil embolization back to the more proximal gastroduodenal artery.  A pair of 4 mm x 30 cm Ruby coils were deployed initially going back to the more proximal gastroduodenal artery prior to the duodenum.  This was slightly larger and a 5 mm x 20 cm Ruby coil was then  deployed followed by packing coil.  Completion imaging showed successful embolization with minimal  flow going into the duodenum at this point. I elected to terminate the procedure. The diagnostic catheter was removed. StarClose closure device was deployed in usual fashion with excellent hemostatic result. The patient was taken to the recovery room in stable condition having tolerated the procedure well.     Findings: none  Disposition: Patient was taken to the recovery room in stable condition having tolerated the procedure well.  Complications: None  Leotis Pain 01/18/2020 1:14 PM   This note was created with Dragon Medical transcription system. Any errors in dictation are purely unintentional.

## 2020-01-18 NOTE — Progress Notes (Signed)
PROGRESS NOTE    Thomas [name]  KYH:[id number] DOB: 10-Feb-1935 DOA: 01/15/2020 PCP: [name], MD    Assessment & Plan:   Principal Problem:   GI bleed Active Problems:   Permanent atrial fibrillation (HCC)   Benign essential HTN   CAD S/P multiple PCIs   Bilateral deafness   CRI (chronic renal insufficiency), stage 3 (moderate) (HCC)   Acute blood loss anemia   Duodenal arteriovenous malformation   Chronic diastolic CHF (congestive heart failure) ([place])   Duodenal ulceration  Acute upper GI bleeding: s/p EGD on 01/15/2020 which showed normal esophagus, normal stomach and nonbleeding duodenal ulcers with no stigmata of bleeding. Continue IV Protonix. Large bloody bowel movements today. General surg consulted for large ulcers that are not amenable to treatment w/ EGD but will go for possible embolization w/ vascular surg 01/18/20   Acute blood loss anemia: s/p transfusion with 3 units of pRBCs. 2 more units of pRBCs will be transfused 01/18/20. Will continue to monitor H&H. Management as stated above   CAD: s/p CABG. Continue to hold aspirn, losartan, aldactone  HLD: continue on statin   Chronic diastolic CHF: appears euvolemic. Continue to hold aldactone, torsemide and losartan.  HTN: will continue to hold losartan, aldactone, torsemide secondary to GI bleed & hypotension   Permanent atrial fibrillation: not on long-term anticoagulation because of history of GI bleed. Not on any rate controlling meds today   CKDIIIa: Cr is trending up today slightly. Avoid nephrotoxic meds  Deafness: communicated via writing   DVT prophylaxis: SCDs Code Status: full  Family Communication:  Disposition Plan: likely d/c back home, PT/OT still needs to see pt when the pt is stable   Status is: Inpatient  Remains inpatient appropriate because:Ongoing diagnostic testing needed not appropriate for outpatient work up and IV treatments appropriate due to intensity of illness or  inability to take PO, will go for embolization w/ vascular surg    Dispo: The patient is from: Home              Anticipated d/c is to: Home              Anticipated d/c date is: 3 days              Patient currently is not medically stable to d/c.     Consultants:     Procedures: EGD showed duodenal ulcers    Antimicrobials:   Subjective: Pt c/o large of blood in stool today  Objective: Vitals:   01/17/20 2010 01/18/20 0353 01/18/20 0353 01/18/20 0746  BP: 107/61  (!) 103/59 (!) 102/57  Pulse: 71  70 72  Resp: 18  17 17   Temp: 98.9 F (37.2 C)  (!) 97.4 F (36.3 C) 98.7 F (37.1 C)  TempSrc: Oral  Oral Oral  SpO2: 95%  97% 97%  Weight:  82.2 kg    Height:        Intake/Output Summary (Last 24 hours) at 01/18/2020 0751 Last data filed at 01/18/2020 0354 Gross per 24 hour  Intake 480 ml  Output 650 ml  Net -170 ml   Filed Weights   01/16/20 0502 01/17/20 0410 01/18/20 0353  Weight: 83.4 kg 83.1 kg 82.2 kg    Examination:  General exam: Appears calm & comfortable  Respiratory system: Clear to auscultation. Respiratory effort normal. Cardiovascular system: irregularly irregular. No rubs or gallops Gastrointestinal system: Abdomen is nondistended, soft and nontender.  Normal bowel sounds heard. Central nervous system: Alert and oriented.  Moves all 4 extremities  Psychiatry: Judgement and insight appear normal. Flat mood and affect    Data Reviewed: I have personally reviewed following labs and imaging studies  CBC: Recent Labs  Lab 01/15/20 0256 01/15/20 0256 01/15/20 1736 01/16/20 0514 01/16/20 1844 01/17/20 0505 01/18/20 0523  WBC 6.7  --   --  5.8  --  6.0 6.3  NEUTROABS 5.4  --   --   --   --  4.5  --   HGB 6.2*   < > 7.1* 7.2* 7.5* 7.9* 6.4*  HCT 20.6*   < > 22.8* 22.9* 23.9* 25.0* 20.5*  MCV 83.7  --   --  84.5  --  86.8 87.2  PLT 244  --   --  211  --  188 178   < > = values in this interval not displayed.   Basic Metabolic  Panel: Recent Labs  Lab 01/15/20 0256 01/16/20 0514 01/18/20 0523  NA 141 139 137  K 5.3* 4.5 4.5  CL 111 109 109  CO2 26 23 23   GLUCOSE 117* 94 109*  BUN 31* 25* 29*  CREATININE 1.17 1.26* 1.28*  CALCIUM 8.3* 8.3* 7.7*   GFR: Estimated Creatinine Clearance: 51.7 mL/min (A) (by C-G formula based on SCr of 1.28 mg/dL (H)). Liver Function Tests: Recent Labs  Lab 01/15/20 0256  AST 18  ALT 11  ALKPHOS 121  BILITOT 0.6  PROT 5.1*  ALBUMIN 2.2*   No results for input(s): LIPASE, AMYLASE in the last 168 hours. No results for input(s): AMMONIA in the last 168 hours. Coagulation Profile: Recent Labs  Lab 01/15/20 0301  INR 1.3*   Cardiac Enzymes: No results for input(s): CKTOTAL, CKMB, CKMBINDEX, TROPONINI in the last 168 hours. BNP (last 3 results) No results for input(s): PROBNP in the last 8760 hours. HbA1C: No results for input(s): HGBA1C in the last 72 hours. CBG: No results for input(s): GLUCAP in the last 168 hours. Lipid Profile: No results for input(s): CHOL, HDL, LDLCALC, TRIG, CHOLHDL, LDLDIRECT in the last 72 hours. Thyroid Function Tests: No results for input(s): TSH, T4TOTAL, FREET4, T3FREE, THYROIDAB in the last 72 hours. Anemia Panel: No results for input(s): VITAMINB12, FOLATE, FERRITIN, TIBC, IRON, RETICCTPCT in the last 72 hours. Sepsis Labs: No results for input(s): PROCALCITON, LATICACIDVEN in the last 168 hours.  Recent Results (from the past 240 hour(s))  Respiratory Panel by RT PCR (Flu A&B, Covid) - Nasopharyngeal Swab     Status: None   Collection Time: 01/15/20  4:52 AM   Specimen: Nasopharyngeal Swab  Result Value Ref Range Status   SARS Coronavirus 2 by RT PCR NEGATIVE NEGATIVE Final    Comment: (NOTE) SARS-CoV-2 target nucleic acids are NOT DETECTED.  The SARS-CoV-2 RNA is generally detectable in upper respiratoy specimens during the acute phase of infection. The lowest concentration of SARS-CoV-2 viral copies this assay can detect  is 131 copies/mL. A negative result does not preclude SARS-Cov-2 infection and should not be used as the sole basis for treatment or other patient management decisions. A negative result may occur with  improper specimen collection/handling, submission of specimen other than nasopharyngeal swab, presence of viral mutation(s) within the areas targeted by this assay, and inadequate number of viral copies (<131 copies/mL). A negative result must be combined with clinical observations, patient history, and epidemiological information. The expected result is Negative.  Fact Sheet for Patients:  PinkCheek.be  Fact Sheet for Healthcare Providers:  GravelBags.it  This test is no  t yet approved or cleared by the [place] and  has been authorized for detection and/or diagnosis of SARS-CoV-2 by FDA under an Emergency Use Authorization (EUA). This EUA will remain  in effect (meaning this test can be used) for the duration of the COVID-19 declaration under Section 564(b)(1) of the Act, 21 U.S.C. section 360bbb-3(b)(1), unless the authorization is terminated or revoked sooner.     Influenza A by PCR NEGATIVE NEGATIVE Final   Influenza B by PCR NEGATIVE NEGATIVE Final    Comment: (NOTE) The Xpert Xpress SARS-CoV-2/FLU/RSV assay is intended as an aid in  the diagnosis of influenza from Nasopharyngeal swab specimens and  should not be used as a sole basis for treatment. Nasal washings and  aspirates are unacceptable for Xpert Xpress SARS-CoV-2/FLU/RSV  testing.  Fact Sheet for Patients: PinkCheek.be  Fact Sheet for Healthcare Providers: GravelBags.it  This test is not yet approved or cleared by the [place] FDA and  has been authorized for detection and/or diagnosis of SARS-CoV-2 by  FDA under an Emergency Use Authorization (EUA). This EUA will remain  in effect  (meaning this test can be used) for the duration of the  Covid-19 declaration under Section 564(b)(1) of the Act, 21  U.S.C. section 360bbb-3(b)(1), unless the authorization is  terminated or revoked. Performed at [facility], [address] [place], [place] [address]          Radiology Studies: No results found.      Scheduled Meds: . sodium chloride   Intravenous Once  . sodium chloride   Intravenous Once  . sodium chloride   Intravenous Once  . pantoprazole (PROTONIX) IV  40 mg Intravenous Q12H  . simvastatin  20 mg Oral Daily  . sodium chloride flush  3 mL Intravenous Q12H   Continuous Infusions: . sodium chloride       LOS: 3 days    Time spent: 33 mins     Wyvonnia Dusky, MD [facility] Pager [phone]  If 7PM-7AM, please contact night-coverage [email or web address] 01/18/2020, 7:51 AM

## 2020-01-19 ENCOUNTER — Encounter: Payer: Self-pay | Admitting: Vascular Surgery

## 2020-01-19 LAB — TYPE AND SCREEN
ABO/RH(D): O POS
Antibody Screen: NEGATIVE
Donor AG Type: NEGATIVE
Donor AG Type: NEGATIVE
Donor AG Type: NEGATIVE
Donor AG Type: NEGATIVE
Donor AG Type: NEGATIVE
Donor AG Type: NEGATIVE
Unit division: 0
Unit division: 0
Unit division: 0
Unit division: 0
Unit division: 0
Unit division: 0
Unit division: 0

## 2020-01-19 LAB — BPAM RBC
Blood Product Expiration Date: 202112042359
Blood Product Expiration Date: 202112062359
Blood Product Expiration Date: 202112062359
Blood Product Expiration Date: 202112072359
Blood Product Expiration Date: 202112072359
Blood Product Expiration Date: 202112082359
Blood Product Expiration Date: 202112102359
ISSUE DATE / TIME: 202111010639
ISSUE DATE / TIME: 202111010911
ISSUE DATE / TIME: 202111021116
ISSUE DATE / TIME: 202111022339
ISSUE DATE / TIME: 202111040946
ISSUE DATE / TIME: 202111041230
Unit Type and Rh: 5100
Unit Type and Rh: 5100
Unit Type and Rh: 5100
Unit Type and Rh: 5100
Unit Type and Rh: 5100
Unit Type and Rh: 5100
Unit Type and Rh: 5100

## 2020-01-19 LAB — BASIC METABOLIC PANEL
Anion gap: 7 (ref 5–15)
BUN: 25 mg/dL — ABNORMAL HIGH (ref 8–23)
CO2: 19 mmol/L — ABNORMAL LOW (ref 22–32)
Calcium: 8.2 mg/dL — ABNORMAL LOW (ref 8.9–10.3)
Chloride: 110 mmol/L (ref 98–111)
Creatinine, Ser: 1.19 mg/dL (ref 0.61–1.24)
GFR, Estimated: >60 mL/min (ref >60)
Glucose, Bld: 104 mg/dL — ABNORMAL HIGH (ref 70–99)
Potassium: 4.4 mmol/L (ref 3.5–5.1)
Sodium: 136 mmol/L (ref 135–145)

## 2020-01-19 LAB — CBC
HCT: 26.4 % — ABNORMAL LOW (ref 39.0–52.0)
Hemoglobin: 8.4 g/dL — ABNORMAL LOW (ref 13.0–17.0)
MCH: 27.5 pg (ref 26.0–34.0)
MCHC: 31.8 g/dL (ref 30.0–36.0)
MCV: 86.6 fL (ref 80.0–100.0)
Platelets: 173 10*3/uL (ref 150–400)
RBC: 3.05 MIL/uL — ABNORMAL LOW (ref 4.22–5.81)
RDW: 19.5 % — ABNORMAL HIGH (ref 11.5–15.5)
WBC: 7 10*3/uL (ref 4.0–10.5)
nRBC: 0 % (ref 0.0–0.2)

## 2020-01-19 LAB — PREPARE RBC (CROSSMATCH)

## 2020-01-19 NOTE — Progress Notes (Signed)
The patient has a stable Hb. S/P embolization yesterday. No endoscopic intervention for this large ulcer. Nothing further to do from a GI point of view.  I will sign off.  Please call if any further GI concerns or questions.  We would like to thank you for the opportunity to participate in the care of Thomas [name].

## 2020-01-19 NOTE — Evaluation (Signed)
Physical Therapy Evaluation Patient Details Name: Thomas [name] MRN: [id number] DOB: 1934-07-01 Today's Date: 01/19/2020   History of Present Illness  85 y.o. male with medical history significant for impaired hearing, CAD, high-grade AVB s/p BiV-ICD, systolic CHF with recovered EF, CKD-3A, A. fib not on AC due to GI bleed.  Came in with weakness and bleed with low H/H.  Clinical Impression  Pt with much better H/H levels today and has more energy and feels able to do some limited ambulation.  He has functional strength in U&LEs, and though he had some minimal unsteadiness/guarded gait he was able to ambulate 40-50 ft w/o direct assist.  Pt was able to sign with interpreter and overall did well.    Follow Up Recommendations Home health PT;Supervision - Intermittent    Equipment Recommendations  None recommended by PT    Recommendations for Other Services       Precautions / Restrictions Precautions Precautions: Fall Restrictions Weight Bearing Restrictions: No      Mobility  Bed Mobility Overal bed mobility: Modified Independent             General bed mobility comments: able to get himself to EOB w/o assist, good confidence    Transfers Overall transfer level: Modified independent Equipment used: Rolling walker (2 wheeled)             General transfer comment: Pt did well using SPC to get to standing w/o assist and w/o safety concern.   Ambulation/Gait Ambulation/Gait assistance: Min guard Gait Distance (Feet): 40 Feet Assistive device: Straight cane       General Gait Details: Pt was able to ambulate with SPC into the hallway, but did not wish to go very far.  He did not have any overt LOBs but did display a few small stagger steps that he self arrested.  Reports feeling less steady, slower and weaker than baseline.   Stairs            Wheelchair Mobility    Modified Rankin (Stroke Patients Only)       Balance Overall balance assessment:  Mild deficits observed, not formally tested                                           Pertinent Vitals/Pain Pain Assessment:  (unrated, but "a little" stomach pain)    Home Living Family/patient expects to be discharged to:: Private residence Living Arrangements: Spouse/significant other;Children Available Help at Discharge: Family;Available 24 hours/day Type of Home: House Home Access: Level entry     Home Layout: One level Home Equipment: Walker - 2 wheels;Cane - single point      Prior Function Level of Independence: Independent with assistive device(s)         Comments: SPC in the home, has had multiple recent falls, reports he cooks, does his own bathing, dresses himself, etc, doesn't walk long distance     Hand Dominance        Extremity/Trunk Assessment   Upper Extremity Assessment Upper Extremity Assessment: Overall WFL for tasks assessed    Lower Extremity Assessment Lower Extremity Assessment: Overall WFL for tasks assessed       Communication   Communication: Deaf (ASL tele interp t/o session, [place])  Cognition Arousal/Alertness: Awake/alert Behavior During Therapy: WFL for tasks assessed/performed Overall Cognitive Status: Within Functional Limits for tasks assessed  General Comments      Exercises     Assessment/Plan    PT Assessment Patient needs continued PT services  PT Problem List Decreased strength;Decreased activity tolerance;Decreased balance;Decreased knowledge of use of DME;Decreased safety awareness;Pain;Decreased mobility       PT Treatment Interventions DME instruction;Gait training;Functional mobility training;Therapeutic activities;Therapeutic exercise;Balance training;Neuromuscular re-education;Patient/family education    PT Goals (Current goals can be found in the Care Plan section)  Acute Rehab PT Goals Patient Stated Goal: Go home PT Goal  Formulation: With patient Time For Goal Achievement: 02/02/20 Potential to Achieve Goals: Good    Frequency Min 2X/week   Barriers to discharge        Co-evaluation               AM-PAC PT "6 Clicks" Mobility  Outcome Measure Help needed turning from your back to your side while in a flat bed without using bedrails?: None Help needed moving from lying on your back to sitting on the side of a flat bed without using bedrails?: None Help needed moving to and from a bed to a chair (including a wheelchair)?: A Little Help needed standing up from a chair using your arms (e.g., wheelchair or bedside chair)?: None Help needed to walk in hospital room?: A Little Help needed climbing 3-5 steps with a railing? : A Little 6 Click Score: 21    End of Session Equipment Utilized During Treatment: Gait belt Activity Tolerance: Patient tolerated treatment well Patient left: with call bell/phone within reach;with bed alarm set (sitting EOB for lunch) Nurse Communication: Mobility status PT Visit Diagnosis: Muscle weakness (generalized) (M62.81);Difficulty in walking, not elsewhere classified (R26.2);Unsteadiness on feet (R26.81)    Time: 8110-3159 PT Time Calculation (min) (ACUTE ONLY): 24 min   Charges:   PT Evaluation $PT Eval Low Complexity: 1 Low          Kreg Shropshire, DPT 01/19/2020, 1:13 PM

## 2020-01-19 NOTE — Progress Notes (Signed)
 Vein & Vascular Surgery Daily Progress Note  Subjective: 01/18/20: 1. Ultrasound guidance for vascular access right femoral artery 2. Catheter placement into celiac, hepatic and gastroduodenal artery 3. Aortogram and selective angiogram of the celiac artery, hepatic artery and gastroduodenal artery 4. Microbead embolization of the gastroduodenal artery with 2 cc of 500-700  polyvinyl alcohol beads.             5.  Coil embolization of the gastroduodenal artery beyond the duodenum into the pancreaticoduodenal artery and then back to the proximal gastroduodenal artery using a total of 4 Ruby coils 6. StarClose closure device right femoral artery  Of note: ASL interpreter used during examination  Patient without nausea, vomiting and abdominal pain this AM.  Continued dark stools overnight.  Denies any shortness of breath or chest pain.  Objective: Vitals:   01/18/20 1656 01/18/20 2110 01/19/20 0355 01/19/20 0724  BP: 120/68 (!) 107/59 114/60 (!) 113/58  Pulse: 73 69 70 70  Resp: 18 18 18 17   Temp: 97.7 F (36.5 C) 97.6 F (36.4 C) 97.7 F (36.5 C) 98.1 F (36.7 C)  TempSrc: Oral Oral Oral   SpO2: 100% 97% 98% 100%  Weight:      Height:        Intake/Output Summary (Last 24 hours) at 01/19/2020 1033 Last data filed at 01/19/2020 9326 Gross per 24 hour  Intake 818.75 ml  Output 505 ml  Net 313.75 ml   Physical Exam: A&Ox3, NAD CV: RRR Pulmonary: CTA Bilaterally Abdomen: Soft, Nontender, Nondistended Access: Clean dry intact.  No swelling or drainage. Vascular: Warm distally toes.   Laboratory: CBC    Component Value Date/Time   WBC 7.0 01/19/2020 0203   HGB 8.4 (L) 01/19/2020 0203   HGB 14.3 02/14/2014 1027   HCT 26.4 (L) 01/19/2020 0203   HCT 43.3 02/14/2014 0546   PLT 173 01/19/2020 0203   PLT 154 02/14/2014 0546   BMET    Component Value Date/Time   NA 136 01/19/2020 0203   NA 146 (H)  03/28/2018 1625   NA 140 02/14/2014 0546   K 4.4 01/19/2020 0203   K 4.1 02/14/2014 0546   CL 110 01/19/2020 0203   CL 107 02/14/2014 0546   CO2 19 (L) 01/19/2020 0203   CO2 25 02/14/2014 0546   GLUCOSE 104 (H) 01/19/2020 0203   GLUCOSE 103 (H) 02/14/2014 0546   BUN 25 (H) 01/19/2020 0203   BUN 33 (H) 03/28/2018 1625   BUN 30 (H) 02/14/2014 0546   CREATININE 1.19 01/19/2020 0203   CREATININE 1.24 02/14/2014 0546   CALCIUM 8.2 (L) 01/19/2020 0203   CALCIUM 9.6 02/14/2014 0546   GFRNONAA >60 01/19/2020 0203   GFRNONAA >60 02/14/2014 0546   GFRNONAA 41 (L) 09/14/2012 2047   GFRAA 56 (L) 10/24/2019 1720   GFRAA >60 02/14/2014 0546   GFRAA 47 (L) 09/14/2012 2047   Assessment/Planning: The patient is an 85 year old male with multiple active issues who presented with upper GI bleed status post embolization - POD#1  1) without abdominal pain nausea vomiting this a.m. 2) benign abdominal exam 3) stable hemoglobin, stable vitals 4) continued dark stools which is to be expected 5) seems to be successful embolization, if any continued signs of bleeding would recommend consultation to general surgery as repeat embolization to the same area increases the risk of perforation, sepsis and death 6) no outpatient vascular follow-up needed  Discussed with Dr. Ellis Parents [name] PA-C 01/19/2020 10:33 AM

## 2020-01-19 NOTE — Progress Notes (Signed)
Subjective:  CC: Thomas [name] is a 85 y.o. male  Hospital stay day 4, 1 Day Post-Op embolization for bleeding duodenal ulcer  HPI: No issues reported post embolization  Objective:   Temp:  [97.6 F (36.4 C)-98.1 F (36.7 C)] 97.9 F (36.6 C) (11/05 1109) Pulse Rate:  [69-73] 69 (11/05 1109) Resp:  [12-18] 17 (11/05 1109) BP: (105-120)/(58-68) 109/60 (11/05 1109) SpO2:  [97 %-100 %] 100 % (11/05 1109)     Height: 6\' 2"  (188 cm) Weight: 82.2 kg BMI (Calculated): 23.26   Intake/Output this shift:   Intake/Output Summary (Last 24 hours) at 01/19/2020 1410 Last data filed at 01/19/2020 1110 Gross per 24 hour  Intake 450 ml  Output 605 ml  Net -155 ml    Constitutional :  alert, cooperative, appears stated age and no distress  Respiratory:  clear to auscultation bilaterally  Cardiovascular:  regular rate and rhythm  Gastrointestinal: soft, non-tender; bowel sounds normal; no masses,  no organomegaly.   Skin: Cool and moist.   Psychiatric: Normal affect, non-agitated, not confused       LABS:  CMP Latest Ref Rng & Units 01/19/2020 01/18/2020 01/16/2020  Glucose 70 - 99 mg/dL 104(H) 109(H) 94  BUN 8 - 23 mg/dL 25(H) 29(H) 25(H)  Creatinine 0.61 - 1.24 mg/dL 1.19 1.28(H) 1.26(H)  Sodium 135 - 145 mmol/L 136 137 139  Potassium 3.5 - 5.1 mmol/L 4.4 4.5 4.5  Chloride 98 - 111 mmol/L 110 109 109  CO2 22 - 32 mmol/L 19(L) 23 23  Calcium 8.9 - 10.3 mg/dL 8.2(L) 7.7(L) 8.3(L)  Total Protein 6.5 - 8.1 g/dL - - -  Total Bilirubin 0.3 - 1.2 mg/dL - - -  Alkaline Phos 38 - 126 U/L - - -  AST 15 - 41 U/L - - -  ALT 0 - 44 U/L - - -   CBC Latest Ref Rng & Units 01/19/2020 01/18/2020 01/18/2020  WBC 4.0 - 10.5 K/uL 7.0 - 6.3  Hemoglobin 13.0 - 17.0 g/dL 8.4(L) 7.5(L) 6.4(L)  Hematocrit 39 - 52 % 26.4(L) 23.5(L) 20.5(L)  Platelets 150 - 400 K/uL 173 - 178    RADS: n/a Assessment:   Duodenal ulcer bleed.  S/p embolization, hgb stable.  No need for surgical intervention.  Surgery to  sign off, please call with any questions

## 2020-01-19 NOTE — Progress Notes (Signed)
PROGRESS NOTE    Thomas [name]  XBJ:[id number] DOB: 1934-10-07 DOA: 01/15/2020 PCP: [name], MD    Assessment & Plan:   Principal Problem:   GI bleed Active Problems:   Permanent atrial fibrillation (HCC)   Benign essential HTN   CAD S/P multiple PCIs   Bilateral deafness   CRI (chronic renal insufficiency), stage 3 (moderate) (HCC)   Acute blood loss anemia   Duodenal arteriovenous malformation   Chronic diastolic CHF (congestive heart failure) ([place])   Duodenal ulceration  Acute upper GI bleeding: s/p EGD on 01/15/2020 which showed normal esophagus, normal stomach and nonbleeding duodenal ulcers with no stigmata of bleeding. Continue IV Protonix. Large bloody bowel movements today. S/p embolization w/ vascular surg 01/18/20. Still w/ dark stools again today but H&H are trending up   Acute blood loss anemia: s/p 5 units of pRBCs transfused. Will continue to monitor H&H   CAD: s/p CABG. Continue on losartan, aldactone secondary low normal BP. Continue to hold aspirin secondary GI bleed   HLD: continue on statin   Chronic diastolic CHF: appears euvolemic. Continue to hold aldactone, torsemide and losartan.  HTN: will continue to hold losartan, aldactone, torsemide secondary to GI bleed & hypotension   Permanent atrial fibrillation: not on anticoagulation secondary to hx of GI bleed. No rate controlling meds as per med rec   CKDIIIa: Cr is trending down from day prior. Will continue monitor   Deafness: communicated via writing   DVT prophylaxis: SCDs Code Status: full  Family Communication:  Disposition Plan: likely d/c back home, PT/OT still needs to see pt   Status is: Inpatient  Remains inpatient appropriate because:Ongoing diagnostic testing needed not appropriate for outpatient work up and IV treatments appropriate due to intensity of illness or inability to take PO, still w/ dark stools but H&H are trending up    Dispo: The patient is from: Home               Anticipated d/c is to: Home vs SNF vs home w/ home health               Anticipated d/c date is: 3 days              Patient currently is not medically stable to d/c.     Consultants:     Procedures: EGD showed duodenal ulcers;  Ultrasound guidance for vascular access right femoral artery 2. Catheter placement into celiac, hepatic and gastroduodenal artery 3. Aortogram and selective angiogram of the celiac artery, hepatic artery and gastroduodenal artery 4. Microbead embolization of the gastroduodenal artery with 2 cc of 500-700  polyvinyl alcohol beads.             5.  Coil embolization of the gastroduodenal artery beyond the duodenum into the pancreaticoduodenal artery and then back to the proximal gastroduodenal artery using a total of 4 Ruby coils 6. StarClose closure device right femoral artery   Antimicrobials:   Subjective: Pt c/o dark stools again today.   Objective: Vitals:   01/18/20 1656 01/18/20 2110 01/19/20 0355 01/19/20 0724  BP: 120/68 (!) 107/59 114/60 (!) 113/58  Pulse: 73 69 70 70  Resp: 18 18 18 17   Temp: 97.7 F (36.5 C) 97.6 F (36.4 C) 97.7 F (36.5 C) 98.1 F (36.7 C)  TempSrc: Oral Oral Oral   SpO2: 100% 97% 98% 100%  Weight:      Height:        Intake/Output Summary (Last 24 hours)  at 01/19/2020 0743 Last data filed at 01/19/2020 0727 Gross per 24 hour  Intake 1178.75 ml  Output 605 ml  Net 573.75 ml   Filed Weights   01/17/20 0410 01/18/20 0353 01/18/20 1205  Weight: 83.1 kg 82.2 kg 82.2 kg    Examination:  General exam: Appears calm & comfortable. Respiratory system: clear breath sounds b/l. No rhonchi, rales  Cardiovascular system: irregularly irregular. No rubs or gallops Gastrointestinal system: Abdomen is nondistended, soft and nontender.  Normal bowel sounds heard. Central nervous system: Alert and oriented. Moves all 4 extremities  Psychiatry: Judgement and  insight appear normal. Normal mood and affect    Data Reviewed: I have personally reviewed following labs and imaging studies  CBC: Recent Labs  Lab 01/15/20 0256 01/15/20 1736 01/16/20 0514 01/16/20 0514 01/16/20 1844 01/17/20 0505 01/18/20 0523 01/18/20 2021 01/19/20 0203  WBC 6.7  --  5.8  --   --  6.0 6.3  --  7.0  NEUTROABS 5.4  --   --   --   --  4.5  --   --   --   HGB 6.2*   < > 7.2*   < > 7.5* 7.9* 6.4* 7.5* 8.4*  HCT 20.6*   < > 22.9*   < > 23.9* 25.0* 20.5* 23.5* 26.4*  MCV 83.7  --  84.5  --   --  86.8 87.2  --  86.6  PLT 244  --  211  --   --  188 178  --  173   < > = values in this interval not displayed.   Basic Metabolic Panel: Recent Labs  Lab 01/15/20 0256 01/16/20 0514 01/18/20 0523 01/19/20 0203  NA 141 139 137 136  K 5.3* 4.5 4.5 4.4  CL 111 109 109 110  CO2 26 23 23  19*  GLUCOSE 117* 94 109* 104*  BUN 31* 25* 29* 25*  CREATININE 1.17 1.26* 1.28* 1.19  CALCIUM 8.3* 8.3* 7.7* 8.2*   GFR: Estimated Creatinine Clearance: 55.6 mL/min (by C-G formula based on SCr of 1.19 mg/dL). Liver Function Tests: Recent Labs  Lab 01/15/20 0256  AST 18  ALT 11  ALKPHOS 121  BILITOT 0.6  PROT 5.1*  ALBUMIN 2.2*   No results for input(s): LIPASE, AMYLASE in the last 168 hours. No results for input(s): AMMONIA in the last 168 hours. Coagulation Profile: Recent Labs  Lab 01/15/20 0301  INR 1.3*   Cardiac Enzymes: No results for input(s): CKTOTAL, CKMB, CKMBINDEX, TROPONINI in the last 168 hours. BNP (last 3 results) No results for input(s): PROBNP in the last 8760 hours. HbA1C: No results for input(s): HGBA1C in the last 72 hours. CBG: No results for input(s): GLUCAP in the last 168 hours. Lipid Profile: No results for input(s): CHOL, HDL, LDLCALC, TRIG, CHOLHDL, LDLDIRECT in the last 72 hours. Thyroid Function Tests: No results for input(s): TSH, T4TOTAL, FREET4, T3FREE, THYROIDAB in the last 72 hours. Anemia Panel: No results for input(s):  VITAMINB12, FOLATE, FERRITIN, TIBC, IRON, RETICCTPCT in the last 72 hours. Sepsis Labs: No results for input(s): PROCALCITON, LATICACIDVEN in the last 168 hours.  Recent Results (from the past 240 hour(s))  Respiratory Panel by RT PCR (Flu A&B, Covid) - Nasopharyngeal Swab     Status: None   Collection Time: 01/15/20  4:52 AM   Specimen: Nasopharyngeal Swab  Result Value Ref Range Status   SARS Coronavirus 2 by RT PCR NEGATIVE NEGATIVE Final    Comment: (NOTE) SARS-CoV-2 target nucleic acids  are NOT DETECTED.  The SARS-CoV-2 RNA is generally detectable in upper respiratoy specimens during the acute phase of infection. The lowest concentration of SARS-CoV-2 viral copies this assay can detect is 131 copies/mL. A negative result does not preclude SARS-Cov-2 infection and should not be used as the sole basis for treatment or other patient management decisions. A negative result may occur with  improper specimen collection/handling, submission of specimen other than nasopharyngeal swab, presence of viral mutation(s) within the areas targeted by this assay, and inadequate number of viral copies (<131 copies/mL). A negative result must be combined with clinical observations, patient history, and epidemiological information. The expected result is Negative.  Fact Sheet for Patients:  PinkCheek.be  Fact Sheet for Healthcare Providers:  GravelBags.it  This test is no t yet approved or cleared by the [place] FDA and  has been authorized for detection and/or diagnosis of SARS-CoV-2 by FDA under an Emergency Use Authorization (EUA). This EUA will remain  in effect (meaning this test can be used) for the duration of the COVID-19 declaration under Section 564(b)(1) of the Act, 21 U.S.C. section 360bbb-3(b)(1), unless the authorization is terminated or revoked sooner.     Influenza A by PCR NEGATIVE NEGATIVE Final   Influenza B  by PCR NEGATIVE NEGATIVE Final    Comment: (NOTE) The Xpert Xpress SARS-CoV-2/FLU/RSV assay is intended as an aid in  the diagnosis of influenza from Nasopharyngeal swab specimens and  should not be used as a sole basis for treatment. Nasal washings and  aspirates are unacceptable for Xpert Xpress SARS-CoV-2/FLU/RSV  testing.  Fact Sheet for Patients: PinkCheek.be  Fact Sheet for Healthcare Providers: GravelBags.it  This test is not yet approved or cleared by the [place] FDA and  has been authorized for detection and/or diagnosis of SARS-CoV-2 by  FDA under an Emergency Use Authorization (EUA). This EUA will remain  in effect (meaning this test can be used) for the duration of the  Covid-19 declaration under Section 564(b)(1) of the Act, 21  U.S.C. section 360bbb-3(b)(1), unless the authorization is  terminated or revoked. Performed at [facility], [address] [place], [place] [address]          Radiology Studies: PERIPHERAL VASCULAR CATHETERIZATION  Result Date: 01/18/2020 See op note       Scheduled Meds: . pantoprazole (PROTONIX) IV  40 mg Intravenous Q12H  . simvastatin  20 mg Oral Daily  . sodium chloride flush  3 mL Intravenous Q12H   Continuous Infusions: . sodium chloride       LOS: 4 days    Time spent: 30 mins     Wyvonnia Dusky, MD [facility] Pager [phone]  If 7PM-7AM, please contact night-coverage [email or web address] 01/19/2020, 7:43 AM

## 2020-01-20 LAB — CBC
HCT: 24.4 % — ABNORMAL LOW (ref 39.0–52.0)
Hemoglobin: 7.7 g/dL — ABNORMAL LOW (ref 13.0–17.0)
MCH: 27.7 pg (ref 26.0–34.0)
MCHC: 31.6 g/dL (ref 30.0–36.0)
MCV: 87.8 fL (ref 80.0–100.0)
Platelets: 191 10*3/uL (ref 150–400)
RBC: 2.78 MIL/uL — ABNORMAL LOW (ref 4.22–5.81)
RDW: 19.4 % — ABNORMAL HIGH (ref 11.5–15.5)
WBC: 7 10*3/uL (ref 4.0–10.5)
nRBC: 0 % (ref 0.0–0.2)

## 2020-01-20 LAB — BASIC METABOLIC PANEL
Anion gap: 7 (ref 5–15)
BUN: 20 mg/dL (ref 8–23)
CO2: 21 mmol/L — ABNORMAL LOW (ref 22–32)
Calcium: 8.5 mg/dL — ABNORMAL LOW (ref 8.9–10.3)
Chloride: 107 mmol/L (ref 98–111)
Creatinine, Ser: 0.9 mg/dL (ref 0.61–1.24)
GFR, Estimated: >60 mL/min (ref >60)
Glucose, Bld: 102 mg/dL — ABNORMAL HIGH (ref 70–99)
Potassium: 4.1 mmol/L (ref 3.5–5.1)
Sodium: 135 mmol/L (ref 135–145)

## 2020-01-20 NOTE — Progress Notes (Signed)
PROGRESS NOTE    Thomas [name]  [id number] DOB: 05-01-1934 DOA: 01/15/2020 PCP: [name], MD    Assessment & Plan:   Principal Problem:   GI bleed Active Problems:   Permanent atrial fibrillation (HCC)   Benign essential HTN   CAD S/P multiple PCIs   Bilateral deafness   CRI (chronic renal insufficiency), stage 3 (moderate) (HCC)   Acute blood loss anemia   Duodenal arteriovenous malformation   Chronic diastolic CHF (congestive heart failure) ([place])   Duodenal ulceration  Acute upper GI bleeding: s/p EGD on 01/15/2020 which showed normal esophagus, normal stomach and nonbleeding duodenal ulcers with no stigmata of bleeding. Continue on protonix. S/p embolization w/ vascular surg 01/18/20. Small amount of blood in stools today but H&H are trending down slightly today.   Acute blood loss anemia: s/p 5 units of pRBCs transfused. Will continue to monitor H&H   CAD:s/p CABG. Will restart home dose of losartan, aldactone, torsemide tomorrow if BP & H&H are stable   HLD: continue on statin   Chronic diastolic CHF: appears euvolemic. Continue to hold aldactone, torsemide and losartan.  HTN: will restart  losartan, aldactone, torsemide tomorrow   Permanent atrial fibrillation: not on anticoagulation secondary to hx of GI bleed. No rate controlling meds as per med rec   CKDIIIa: Cr continues to trend down.   Deafness: communicated via writing   DVT prophylaxis: SCDs Code Status: full  Family Communication:  Disposition Plan: d/c back home w/ home health which has already been set up   Status is: Inpatient  Remains inpatient appropriate because:Ongoing diagnostic testing needed not appropriate for outpatient work up and IV treatments appropriate due to intensity of illness or inability to take PO, still w/ blood in stool and H&H are trending down slightly today   Dispo: The patient is from: Home              Anticipated d/c is to: Home w/ home health                Anticipated d/c date is: 1 day               Patient currently is not medically stable to d/c.     Consultants:     Procedures: EGD showed duodenal ulcers;  Ultrasound guidance for vascular access right femoral artery 2. Catheter placement into celiac, hepatic and gastroduodenal artery 3. Aortogram and selective angiogram of the celiac artery, hepatic artery and gastroduodenal artery 4. Microbead embolization of the gastroduodenal artery with 2 cc of 500-700  polyvinyl alcohol beads.             5.  Coil embolization of the gastroduodenal artery beyond the duodenum into the pancreaticoduodenal artery and then back to the proximal gastroduodenal artery using a total of 4 Ruby coils 6. StarClose closure device right femoral artery   Antimicrobials:   Subjective: Pt c/o small amount of blood in stool today   Objective: Vitals:   01/19/20 2008 01/20/20 0501 01/20/20 0621 01/20/20 0729  BP: 112/72 (!) 106/56  113/60  Pulse: 69 70  66  Resp: 16 19  17   Temp: 98 F (36.7 C) (!) 97.4 F (36.3 C)  97.9 F (36.6 C)  TempSrc: Oral Oral  Oral  SpO2: 98% 97%  98%  Weight:   84.9 kg   Height:        Intake/Output Summary (Last 24 hours) at 01/20/2020 0801 Last data filed at 01/19/2020 1920 Gross per 24  hour  Intake 540 ml  Output 100 ml  Net 440 ml   Filed Weights   01/18/20 0353 01/18/20 1205 01/20/20 0621  Weight: 82.2 kg 82.2 kg 84.9 kg    Examination:  General exam: Appears calm & comfortable  Respiratory system: clear breath sounds b/l. No wheezes Cardiovascular system: irregularly irregular. No rubs or gallops Gastrointestinal system: Abdomen is nondistended, soft and nontender.  Hypoactive bowel sounds  Central nervous system: Alert and oriented. Moves all 4 extremities  Psychiatry: Judgement and insight appear normal. Normal mood and affect    Data Reviewed: I have personally reviewed following labs and  imaging studies  CBC: Recent Labs  Lab 01/15/20 0256 01/15/20 1736 01/16/20 0514 01/16/20 1844 01/17/20 0505 01/18/20 0523 01/18/20 2021 01/19/20 0203 01/20/20 0437  WBC 6.7  --  5.8  --  6.0 6.3  --  7.0 7.0  NEUTROABS 5.4  --   --   --  4.5  --   --   --   --   HGB 6.2*   < > 7.2*   < > 7.9* 6.4* 7.5* 8.4* 7.7*  HCT 20.6*   < > 22.9*   < > 25.0* 20.5* 23.5* 26.4* 24.4*  MCV 83.7  --  84.5  --  86.8 87.2  --  86.6 87.8  PLT 244  --  211  --  188 178  --  173 191   < > = values in this interval not displayed.   Basic Metabolic Panel: Recent Labs  Lab 01/15/20 0256 01/16/20 0514 01/18/20 0523 01/19/20 0203 01/20/20 0437  NA 141 139 137 136 135  K 5.3* 4.5 4.5 4.4 4.1  CL 111 109 109 110 107  CO2 26 23 23  19* 21*  GLUCOSE 117* 94 109* 104* 102*  BUN 31* 25* 29* 25* 20  CREATININE 1.17 1.26* 1.28* 1.19 0.90  CALCIUM 8.3* 8.3* 7.7* 8.2* 8.5*   GFR: Estimated Creatinine Clearance: 73.6 mL/min (by C-G formula based on SCr of 0.9 mg/dL). Liver Function Tests: Recent Labs  Lab 01/15/20 0256  AST 18  ALT 11  ALKPHOS 121  BILITOT 0.6  PROT 5.1*  ALBUMIN 2.2*   No results for input(s): LIPASE, AMYLASE in the last 168 hours. No results for input(s): AMMONIA in the last 168 hours. Coagulation Profile: Recent Labs  Lab 01/15/20 0301  INR 1.3*   Cardiac Enzymes: No results for input(s): CKTOTAL, CKMB, CKMBINDEX, TROPONINI in the last 168 hours. BNP (last 3 results) No results for input(s): PROBNP in the last 8760 hours. HbA1C: No results for input(s): HGBA1C in the last 72 hours. CBG: No results for input(s): GLUCAP in the last 168 hours. Lipid Profile: No results for input(s): CHOL, HDL, LDLCALC, TRIG, CHOLHDL, LDLDIRECT in the last 72 hours. Thyroid Function Tests: No results for input(s): TSH, T4TOTAL, FREET4, T3FREE, THYROIDAB in the last 72 hours. Anemia Panel: No results for input(s): VITAMINB12, FOLATE, FERRITIN, TIBC, IRON, RETICCTPCT in the last 72  hours. Sepsis Labs: No results for input(s): PROCALCITON, LATICACIDVEN in the last 168 hours.  Recent Results (from the past 240 hour(s))  Respiratory Panel by RT PCR (Flu A&B, Covid) - Nasopharyngeal Swab     Status: None   Collection Time: 01/15/20  4:52 AM   Specimen: Nasopharyngeal Swab  Result Value Ref Range Status   SARS Coronavirus 2 by RT PCR NEGATIVE NEGATIVE Final    Comment: (NOTE) SARS-CoV-2 target nucleic acids are NOT DETECTED.  The SARS-CoV-2 RNA is generally  detectable in upper respiratoy specimens during the acute phase of infection. The lowest concentration of SARS-CoV-2 viral copies this assay can detect is 131 copies/mL. A negative result does not preclude SARS-Cov-2 infection and should not be used as the sole basis for treatment or other patient management decisions. A negative result may occur with  improper specimen collection/handling, submission of specimen other than nasopharyngeal swab, presence of viral mutation(s) within the areas targeted by this assay, and inadequate number of viral copies (<131 copies/mL). A negative result must be combined with clinical observations, patient history, and epidemiological information. The expected result is Negative.  Fact Sheet for Patients:  PinkCheek.be  Fact Sheet for Healthcare Providers:  GravelBags.it  This test is no t yet approved or cleared by the [place] FDA and  has been authorized for detection and/or diagnosis of SARS-CoV-2 by FDA under an Emergency Use Authorization (EUA). This EUA will remain  in effect (meaning this test can be used) for the duration of the COVID-19 declaration under Section 564(b)(1) of the Act, 21 U.S.C. section 360bbb-3(b)(1), unless the authorization is terminated or revoked sooner.     Influenza A by PCR NEGATIVE NEGATIVE Final   Influenza B by PCR NEGATIVE NEGATIVE Final    Comment: (NOTE) The Xpert Xpress  SARS-CoV-2/FLU/RSV assay is intended as an aid in  the diagnosis of influenza from Nasopharyngeal swab specimens and  should not be used as a sole basis for treatment. Nasal washings and  aspirates are unacceptable for Xpert Xpress SARS-CoV-2/FLU/RSV  testing.  Fact Sheet for Patients: PinkCheek.be  Fact Sheet for Healthcare Providers: GravelBags.it  This test is not yet approved or cleared by the [place] FDA and  has been authorized for detection and/or diagnosis of SARS-CoV-2 by  FDA under an Emergency Use Authorization (EUA). This EUA will remain  in effect (meaning this test can be used) for the duration of the  Covid-19 declaration under Section 564(b)(1) of the Act, 21  U.S.C. section 360bbb-3(b)(1), unless the authorization is  terminated or revoked. Performed at [facility], [address] [place], [place] [address]          Radiology Studies: PERIPHERAL VASCULAR CATHETERIZATION  Result Date: 01/18/2020 See op note       Scheduled Meds: . pantoprazole (PROTONIX) IV  40 mg Intravenous Q12H  . simvastatin  20 mg Oral Daily  . sodium chloride flush  3 mL Intravenous Q12H   Continuous Infusions: . sodium chloride       LOS: 5 days    Time spent: 33 mins     Wyvonnia Dusky, MD [facility] Pager [phone]  If 7PM-7AM, please contact night-coverage [email or web address] 01/20/2020, 8:01 AM

## 2020-01-20 NOTE — TOC Progression Note (Signed)
Transition of Care [facility]) - Progression Note    Patient Details  Name: Thomas [name] MRN: [id number] Date of Birth: 1934/06/09  [facility]) CM/SW Contact  Meriel Flavors, LCSW Phone Number: 01/20/2020, 11:57 AM  Clinical Narrative:    Dr. Eric Form CSW patient needs HH/PT at discharge. CSW was able to get patient set up with [facility]. Spoke with Tillie Rung [phone]        Expected Discharge Plan and Services                                                 Social Determinants of Health (SDOH) Interventions    Readmission Risk Interventions Readmission Risk Prevention Plan 08/21/2019 06/23/2019 06/20/2019  Transportation Screening Complete Complete Complete  PCP or Specialist Appt within 3-5 Days - Complete -  HRI or [place] - Complete Complete  Social Work Consult for [place] Planning/Counseling - Complete -  Palliative Care Screening - Not Applicable Not Applicable  Medication Review Press photographer) Complete Complete Complete  PCP or Specialist appointment within 3-5 days of discharge Complete - -  [place] or Home Care Consult Complete - -  [place] Complete - -  Some recent data might be hidden

## 2020-01-21 ENCOUNTER — Other Ambulatory Visit: Payer: Self-pay | Admitting: Internal Medicine

## 2020-01-21 LAB — CBC
HCT: 24.7 % — ABNORMAL LOW (ref 39.0–52.0)
Hemoglobin: 7.8 g/dL — ABNORMAL LOW (ref 13.0–17.0)
MCH: 27.7 pg (ref 26.0–34.0)
MCHC: 31.6 g/dL (ref 30.0–36.0)
MCV: 87.6 fL (ref 80.0–100.0)
Platelets: 179 10*3/uL (ref 150–400)
RBC: 2.82 MIL/uL — ABNORMAL LOW (ref 4.22–5.81)
RDW: 19.7 % — ABNORMAL HIGH (ref 11.5–15.5)
WBC: 5.6 10*3/uL (ref 4.0–10.5)
nRBC: 0 % (ref 0.0–0.2)

## 2020-01-21 LAB — BASIC METABOLIC PANEL
Anion gap: 3 — ABNORMAL LOW (ref 5–15)
BUN: 18 mg/dL (ref 8–23)
CO2: 24 mmol/L (ref 22–32)
Calcium: 8 mg/dL — ABNORMAL LOW (ref 8.9–10.3)
Chloride: 109 mmol/L (ref 98–111)
Creatinine, Ser: 1.01 mg/dL (ref 0.61–1.24)
GFR, Estimated: >60 mL/min (ref >60)
Glucose, Bld: 98 mg/dL (ref 70–99)
Potassium: 4.4 mmol/L (ref 3.5–5.1)
Sodium: 136 mmol/L (ref 135–145)

## 2020-01-21 MED ORDER — PANTOPRAZOLE SODIUM 40 MG PO TBEC: 40.0000 mg | DELAYED_RELEASE_TABLET | Freq: Two times a day (BID) | ORAL | 0 refills | Status: DC

## 2020-01-21 NOTE — Discharge Summary (Signed)
Physician Discharge Summary  Thomas [name] DQQ:[id number] DOB: 22-Apr-1934 DOA: 01/15/2020  PCP: [name], MD  Admit date: 01/15/2020 Discharge date: 01/21/2020  Admitted From: home  Disposition: home w/ home health   Recommendations for Outpatient Follow-up:  1. Follow up with PCP in 1-2 weeks 2. F/u w/ GI in 1 month   Home Health: yes  Equipment/Devices:  Discharge Condition: stable  CODE STATUS: full  Diet recommendation: Heart Healthy  Brief/Interim Summary: HPI was taken from Dr. Francine Graven: Thomas [name] is a 85 y.o. male with medical history significant for impaired hearing, CAD, high-grade AVB s/p BiV-ICD, systolic CHF with recovered EF, CKD-3A, A. fib not on AC due to GI bleed/duodenal AVM, lymphedema, peptic ulcer disease, prostate cancer and history of group B strep bacteremia s/p 6 weeks of antibiotic therapy and status post extraction of BiV-ICD system due to recurrent bacteremia. He had right-sided BiV-ICD insertion CRT-D on 6/18. He also underwent dental extraction involving tooth #2, 28 and 31 with 2 quadrants of alveoloplasty.  Patient was brought into the emergency room by EMS for evaluation of bleeding and was noted to have dark red blood stains on his clothes and lower extremities.  Patient states that he has a history of GI bleed and that he has had emesis containing blood at home as well as dark blood per rectum.  He also complains of pain in his right upper quadrant but denies having any fever or chills.  He complains of weakness and fatigue but denies having any chest pain or shortness of breath. He admits to occasional NSAID use for low back pain but states that he takes it with food. Labs reveal sodium 141, potassium 5.3, chloride 111, bicarb 26, glucose 117, BUN 31, creatinine 1.17, calcium 8.3, alkaline phosphatase 121, albumin 2.2, AST 18, ALT 11, white count 6.7, hemoglobin 6.2, hematocrit 20.6, MCV 83.7, RDW 20.9, PT 16, INR 1.3 Respiratory viral panel is  negative CT scan of abdomen and pelvis shows  Inflammation at the pancreaticoduodenal groove which could be from pancreatitis or peptic ulcer disease, please correlate with Labs. Chronic bilateral pleural effusion with atelectasis. 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 Extensive right renal cortical scarring.    ED Course: Patient is an 85 year old Caucasian male who presents to the emergency room for evaluation of weakness and fatigue as well as hematemesis and passage of melena stools.  Patient was noted to have a hemoglobin of 6.2 g per DL and blood transfusion was started in the emergency room.  He has a known history of peptic ulcer disease as well as bleeding AVMs and will be admitted to the hospital for further evaluation.   Hospital Course from Dr Lenna Sciara. [name] 11/3-11/7/21: Pt was found to have acute upper GI bleed secondary to duodenal ulcers. Pt had to have embolization done by vascular surg on 01/18/20 as the pt continue to bleed and the duodenal ulcer was not amenable to treatment w/ EGD as per GI. After embolization, pt's H&H slowly began to stabilize. Of note, PT/OT saw the pt and recommended home health and home health was set up by CM prior to d/c. For more information, please see previous progress/consult notes.   Discharge Diagnoses:  Principal Problem:   GI bleed Active Problems:   Permanent atrial fibrillation (HCC)   Benign essential HTN   CAD S/P multiple PCIs   Bilateral deafness   CRI (chronic renal insufficiency), stage 3 (moderate) (HCC)   Acute blood loss anemia   Duodenal arteriovenous malformation  Chronic diastolic CHF (congestive heart failure) (HCC)   Duodenal ulceration  Acute upper GI bleeding:s/pEGD on 01/15/2020 which showed normal esophagus, normal stomach and nonbleeding duodenal ulcers with no stigmata of bleeding. Continue on protonix. S/p embolization w/ vascular surg 01/18/20. H&H are trending up slightly today   Acute  blood loss anemia:s/p 5 units of pRBCs transfused. Will continue to monitor H&H   CAD:s/p CABG. Will restart home dose of losartan, aldactone, torsemide   HLD: continue on statin   Chronic diastolic CHF: appears euvolemic. Continue to hold aldactone, torsemide and losartan.  HTN: will restart  losartan, aldactone, torsemide   Permanent atrial fibrillation: not on anticoagulation secondary to hx of GI bleed. No rate controlling meds as per med rec   CKDIIIa: Cr is stable   Deafness: communicated via writing   Discharge Instructions  Discharge Instructions    Diet - low sodium heart healthy   Complete by: As directed    Discharge instructions   Complete by: As directed    F/u PCP in 1-2 weeks. F/u GI in 1 month   Discharge wound care:   Complete by: As directed    Wound care as stated above   Increase activity slowly   Complete by: As directed      Allergies as of 01/21/2020      Reactions   Entresto [sacubitril-valsartan] Swelling   And bruising of arm   Phenazopyridine Nausea Only, Other (See Comments)   GI UPSET   Ramipril Other (See Comments)   unk Other reaction(s): Other (See Comments), Unknown unk      Medication List    TAKE these medications   acetaminophen 325 MG tablet Commonly known as: TYLENOL Take 2 tablets (650 mg total) by mouth every 6 (six) hours as needed for mild pain or fever.   albuterol (2.5 MG/3ML) 0.083% nebulizer solution Commonly known as: PROVENTIL Inhale 3 mLs into the lungs every 4 (four) hours as needed for wheezing or shortness of breath.   ferrous sulfate 325 (65 FE) MG tablet Take 1 tablet (325 mg total) by mouth 2 (two) times daily with a meal. What changed: when to take this   Gerhardt's butt cream Crea Apply 1 application topically 2 (two) times daily. What changed:   when to take this  reasons to take this   losartan 25 MG tablet Commonly known as: COZAAR Take 25 mg by mouth daily.   midodrine 5 MG  tablet Commonly known as: PROAMATINE TAKE 1 TABLET (5 MG TOTAL) BY MOUTH 3 (THREE) TIMES DAILY WITH MEALS.   multivitamin with minerals Tabs tablet Take 1 tablet by mouth daily.   ondansetron 4 MG tablet Commonly known as: Zofran Take 1 tablet (4 mg total) by mouth every 8 (eight) hours as needed for nausea or vomiting.   pantoprazole 40 MG tablet Commonly known as: Protonix Take 1 tablet (40 mg total) by mouth 2 (two) times daily. What changed: See the new instructions.   simvastatin 20 MG tablet Commonly known as: ZOCOR TAKE 1 TABLET BY MOUTH EVERY DAY   spironolactone 25 MG tablet Commonly known as: ALDACTONE Take 1 tablet (25 mg total) by mouth daily.   torsemide 20 MG tablet Commonly known as: DEMADEX Take 2 tablets (40 mg total) by mouth 2 (two) times daily.            Discharge Care Instructions  (From admission, onward)         Start     Ordered  01/21/20 0000  Discharge wound care:       Comments: Wound care as stated above   01/21/20 1134          Allergies  Allergen Reactions  . Entresto [Sacubitril-Valsartan] Swelling    And bruising of arm  . Phenazopyridine Nausea Only and Other (See Comments)    GI UPSET  . Ramipril Other (See Comments)    unk Other reaction(s): Other (See Comments), Unknown unk    Consultations:  GI  Vascular surg    Procedures/Studies: PERIPHERAL VASCULAR CATHETERIZATION  Result Date: 01/18/2020 See op note  CT Angio Abd/Pel w/ and/or w/o  Result Date: 01/15/2020 CLINICAL DATA:  Acute nonlocalized abdominal pain.  GI bleeding EXAM: CTA ABDOMEN AND PELVIS WITHOUT AND WITH CONTRAST TECHNIQUE: Multidetector CT imaging of the abdomen and pelvis was performed using the standard protocol during bolus administration of intravenous contrast. Multiplanar reconstructed images and MIPs were obtained and reviewed to evaluate the vascular anatomy. CONTRAST:  155mL OMNIPAQUE IOHEXOL 350 MG/ML SOLN COMPARISON:  06/17/2019  abdomen and pelvis CT without contrast FINDINGS: VASCULAR Aorta: Stent grafting of the aorta and iliacs. No high-density hematoma seen within the wall and noncontrast phase. Well-opposed stent graft to the infrarenal aorta. No adjacent fat stranding seen. Celiac: Heavily calcified branches from atherosclerosis. No acute finding. Proximal narrowing is approximately 50%. SMA: Diffuse atheromatous plaque. No branch occlusion or dissection seen. Renals: Accessory upper pole renal artery on the left. The renal arteries are heavily calcified. No aneurysm or occlusion seen. No detected beading. IMA: Poorly enhancing, being covered by the stent graft Inflow: Heavily calcified on both sides. 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 No significant change from prior. No superimposed dissection. Proximal Outflow: Diffuse atheromatous calcification Veins: No portal or systemic venous occlusion is seen. 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 Review of the MIP images confirms the above findings. NON-VASCULAR Lower chest: At least moderate bilateral pleural effusion with atelectasis. The heart is enlarged and there is a biventricular pacer. Hepatobiliary: 2.8 cm cystic density mass in the left lobe liver. No visible gallbladder inflammation or calcified stone Pancreas: Fat stranding and expansion around the pancreatic head. Spleen: Granulomatous calcifications Adrenals/Urinary Tract: Negative adrenals. Patchy right renal cortical scarring which is extensive. Renal hilar calcifications are atheromatous. Unremarkable bladder. Stomach/Bowel: No intraluminal hemorrhage is seen when accounting for intrinsic high-density bowel contents at the distal colon. Inflammation around the mid duodenum. There are a few colonic diverticula Lymphatic: Negative for adenopathy or mass Reproductive: Enlarged prostate, asymmetric to the left, unchanged. Fiducial markers are present in the  prostate. Other: No ascites or pneumoperitoneum Musculoskeletal: Degenerative disease without acute finding IMPRESSION: VASCULAR 1. No emergent finding. 2. Aortoiliac stent grafting. 3.  Aortic Atherosclerosis (ICD10-I70.0). NON-VASCULAR 1. Inflammation at the pancreaticoduodenal groove which could be from pancreatitis or peptic ulcer disease, please correlate with labs. 2. Chronic bilateral pleural effusion with atelectasis. 3. 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 4. Extensive right renal cortical scarring. Electronically Signed   By: Monte Fantasia M.D.   On: 01/15/2020 05:00    Subjective: Pt c/o fatigue    Discharge Exam: Vitals:   01/21/20 0730 01/21/20 1127  BP: 111/68 107/63  Pulse: 69 69  Resp: 16 16  Temp: 97.8 F (36.6 C) 97.8 F (36.6 C)  SpO2: 97% 99%   Vitals:   01/20/20 1940 01/21/20 0420 01/21/20 0730 01/21/20 1127  BP: (!) 110/57 111/62 111/68 107/63  Pulse: 70 70 69  69  Resp:  18 16 16   Temp: 98.4 F (36.9 C) 97.7 F (36.5 C) 97.8 F (36.6 C) 97.8 F (36.6 C)  TempSrc: Oral Axillary Oral Oral  SpO2: 97% 97% 97% 99%  Weight:      Height:        General: Pt is alert, awake, not in acute distress Cardiovascular: S1/S2 +, no rubs, no gallops Respiratory: CTA bilaterally, no wheezing, no rhonchi Abdominal: Soft, NT, ND, bowel sounds + Extremities: no edema, no cyanosis    The results of significant diagnostics from this hospitalization (including imaging, microbiology, ancillary and laboratory) are listed below for reference.     Microbiology: Recent Results (from the past 240 hour(s))  Respiratory Panel by RT PCR (Flu A&B, Covid) - Nasopharyngeal Swab     Status: None   Collection Time: 01/15/20  4:52 AM   Specimen: Nasopharyngeal Swab  Result Value Ref Range Status   SARS Coronavirus 2 by RT PCR NEGATIVE NEGATIVE Final    Comment: (NOTE) SARS-CoV-2 target nucleic acids are NOT DETECTED.  The SARS-CoV-2 RNA is generally  detectable in upper respiratoy specimens during the acute phase of infection. The lowest concentration of SARS-CoV-2 viral copies this assay can detect is 131 copies/mL. A negative result does not preclude SARS-Cov-2 infection and should not be used as the sole basis for treatment or other patient management decisions. A negative result may occur with  improper specimen collection/handling, submission of specimen other than nasopharyngeal swab, presence of viral mutation(s) within the areas targeted by this assay, and inadequate number of viral copies (<131 copies/mL). A negative result must be combined with clinical observations, patient history, and epidemiological information. The expected result is Negative.  Fact Sheet for Patients:  PinkCheek.be  Fact Sheet for Healthcare Providers:  GravelBags.it  This test is no t yet approved or cleared by the [place] FDA and  has been authorized for detection and/or diagnosis of SARS-CoV-2 by FDA under an Emergency Use Authorization (EUA). This EUA will remain  in effect (meaning this test can be used) for the duration of the COVID-19 declaration under Section 564(b)(1) of the Act, 21 U.S.C. section 360bbb-3(b)(1), unless the authorization is terminated or revoked sooner.     Influenza A by PCR NEGATIVE NEGATIVE Final   Influenza B by PCR NEGATIVE NEGATIVE Final    Comment: (NOTE) The Xpert Xpress SARS-CoV-2/FLU/RSV assay is intended as an aid in  the diagnosis of influenza from Nasopharyngeal swab specimens and  should not be used as a sole basis for treatment. Nasal washings and  aspirates are unacceptable for Xpert Xpress SARS-CoV-2/FLU/RSV  testing.  Fact Sheet for Patients: PinkCheek.be  Fact Sheet for Healthcare Providers: GravelBags.it  This test is not yet approved or cleared by the [place] FDA and   has been authorized for detection and/or diagnosis of SARS-CoV-2 by  FDA under an Emergency Use Authorization (EUA). This EUA will remain  in effect (meaning this test can be used) for the duration of the  Covid-19 declaration under Section 564(b)(1) of the Act, 21  U.S.C. section 360bbb-3(b)(1), unless the authorization is  terminated or revoked. Performed at [facility], [address] [place], [place] [address]      Labs: BNP (last 3 results) Recent Labs    08/18/19 1353 08/24/19 2100 09/05/19 0500  BNP 588.8* 480.2* 607.3*   Basic Metabolic Panel: Recent Labs  Lab 01/16/20 0514 01/18/20 0523 01/19/20 0203 01/20/20 0437 01/21/20 0454  NA 139 137 136 135  136  K 4.5 4.5 4.4 4.1 4.4  CL 109 109 110 107 109  CO2 23 23 19* 21* 24  GLUCOSE 94 109* 104* 102* 98  BUN 25* 29* 25* 20 18  CREATININE 1.26* 1.28* 1.19 0.90 1.01  CALCIUM 8.3* 7.7* 8.2* 8.5* 8.0*   Liver Function Tests: Recent Labs  Lab 01/15/20 0256  AST 18  ALT 11  ALKPHOS 121  BILITOT 0.6  PROT 5.1*  ALBUMIN 2.2*   No results for input(s): LIPASE, AMYLASE in the last 168 hours. No results for input(s): AMMONIA in the last 168 hours. CBC: Recent Labs  Lab 01/15/20 0256 01/15/20 1736 01/17/20 0505 01/17/20 0505 01/18/20 0523 01/18/20 2021 01/19/20 0203 01/20/20 0437 01/21/20 0454  WBC 6.7   < > 6.0  --  6.3  --  7.0 7.0 5.6  NEUTROABS 5.4  --  4.5  --   --   --   --   --   --   HGB 6.2*   < > 7.9*   < > 6.4* 7.5* 8.4* 7.7* 7.8*  HCT 20.6*   < > 25.0*   < > 20.5* 23.5* 26.4* 24.4* 24.7*  MCV 83.7   < > 86.8  --  87.2  --  86.6 87.8 87.6  PLT 244   < > 188  --  178  --  173 191 179   < > = values in this interval not displayed.   Cardiac Enzymes: No results for input(s): CKTOTAL, CKMB, CKMBINDEX, TROPONINI in the last 168 hours. BNP: Invalid input(s): POCBNP CBG: No results for input(s): GLUCAP in the last 168 hours. D-Dimer No results for input(s): DDIMER in the last 72  hours. Hgb A1c No results for input(s): HGBA1C in the last 72 hours. Lipid Profile No results for input(s): CHOL, HDL, LDLCALC, TRIG, CHOLHDL, LDLDIRECT in the last 72 hours. Thyroid function studies No results for input(s): TSH, T4TOTAL, T3FREE, THYROIDAB in the last 72 hours.  Invalid input(s): FREET3 Anemia work up No results for input(s): VITAMINB12, FOLATE, FERRITIN, TIBC, IRON, RETICCTPCT in the last 72 hours. Urinalysis    Component Value Date/Time   COLORURINE YELLOW (A) 08/20/2019 0924   APPEARANCEUR HAZY (A) 08/20/2019 0924   APPEARANCEUR Clear 12/13/2017 0918   LABSPEC 1.015 08/20/2019 0924   LABSPEC 1.021 11/19/2011 1501   PHURINE 5.0 08/20/2019 0924   GLUCOSEU NEGATIVE 08/20/2019 0924   GLUCOSEU Negative 11/19/2011 1501   HGBUR NEGATIVE 08/20/2019 0924   BILIRUBINUR NEGATIVE 08/20/2019 0924   BILIRUBINUR Negative 12/13/2017 0918   BILIRUBINUR Negative 11/19/2011 1501   KETONESUR NEGATIVE 08/20/2019 0924   PROTEINUR NEGATIVE 08/20/2019 0924   NITRITE NEGATIVE 08/20/2019 0924   LEUKOCYTESUR NEGATIVE 08/20/2019 0924   LEUKOCYTESUR Negative 11/19/2011 1501   Sepsis Labs Invalid input(s): PROCALCITONIN,  WBC,  LACTICIDVEN Microbiology Recent Results (from the past 240 hour(s))  Respiratory Panel by RT PCR (Flu A&B, Covid) - Nasopharyngeal Swab     Status: None   Collection Time: 01/15/20  4:52 AM   Specimen: Nasopharyngeal Swab  Result Value Ref Range Status   SARS Coronavirus 2 by RT PCR NEGATIVE NEGATIVE Final    Comment: (NOTE) SARS-CoV-2 target nucleic acids are NOT DETECTED.  The SARS-CoV-2 RNA is generally detectable in upper respiratoy specimens during the acute phase of infection. The lowest concentration of SARS-CoV-2 viral copies this assay can detect is 131 copies/mL. A negative result does not preclude SARS-Cov-2 infection and should not be used as the sole basis for treatment  or other patient management decisions. A negative result may occur with   improper specimen collection/handling, submission of specimen other than nasopharyngeal swab, presence of viral mutation(s) within the areas targeted by this assay, and inadequate number of viral copies (<131 copies/mL). A negative result must be combined with clinical observations, patient history, and epidemiological information. The expected result is Negative.  Fact Sheet for Patients:  PinkCheek.be  Fact Sheet for Healthcare Providers:  GravelBags.it  This test is no t yet approved or cleared by the [place] FDA and  has been authorized for detection and/or diagnosis of SARS-CoV-2 by FDA under an Emergency Use Authorization (EUA). This EUA will remain  in effect (meaning this test can be used) for the duration of the COVID-19 declaration under Section 564(b)(1) of the Act, 21 U.S.C. section 360bbb-3(b)(1), unless the authorization is terminated or revoked sooner.     Influenza A by PCR NEGATIVE NEGATIVE Final   Influenza B by PCR NEGATIVE NEGATIVE Final    Comment: (NOTE) The Xpert Xpress SARS-CoV-2/FLU/RSV assay is intended as an aid in  the diagnosis of influenza from Nasopharyngeal swab specimens and  should not be used as a sole basis for treatment. Nasal washings and  aspirates are unacceptable for Xpert Xpress SARS-CoV-2/FLU/RSV  testing.  Fact Sheet for Patients: PinkCheek.be  Fact Sheet for Healthcare Providers: GravelBags.it  This test is not yet approved or cleared by the [place] FDA and  has been authorized for detection and/or diagnosis of SARS-CoV-2 by  FDA under an Emergency Use Authorization (EUA). This EUA will remain  in effect (meaning this test can be used) for the duration of the  Covid-19 declaration under Section 564(b)(1) of the Act, 21  U.S.C. section 360bbb-3(b)(1), unless the authorization is  terminated or  revoked. Performed at [facility], [address], [place], [place] [address]      Time coordinating discharge: Over 30 minutes  SIGNED:   Wyvonnia Dusky, MD  [facility] 01/21/2020, 12:31 PM Pager   If 7PM-7AM, please contact night-coverage

## 2020-01-21 NOTE — Progress Notes (Signed)
Mobility Specialist - Progress Note   01/21/20 1126  Mobility  Activity Ambulated in room;Ambulated in hall  Level of Assistance Modified independent, requires aide device or extra time (CGA for safety)  Assistive Device [facility] Ambulated (ft) 40 ft  Mobility Response Tolerated fair  Mobility performed by Mobility specialist  $Mobility charge 1 Mobility    VRI utilized for interpretation: Margreta Journey, [place]. Pt laying in bed upon arrival. Pt agreed to session. Pt mod. Independent getting to EOB. Mobility specialist tried to provide CGA for S2S. Pt refused. Attempted to S2S SBA. Pt unable to hold stability during the 1st attempt. 2nd attempt successful. Pt encouraged to stand at bedside for awhile and to not rush to ambulate. Pt unable to follow commands thoroughly. Pt ambulated 40' total in room and hallway mod. Independently. CGA for safety. Pt had slight LOB during session d/t ambulating at an increased pace. Would not follow VCs. Pt c/o slight SOB. Noted pt desat to 92%, but quickly sat up to 95% after sitting EOB. Easily managed SOB by PLB. Encouraged pt to take his time when ambulating, for future reference. Overall, pt tolerated session fair. Pt left laying in bed w/ all needs placed in reach.     Manuela Halbur Mobility Specialist  01/21/20, 11:41 AM

## 2020-01-21 NOTE — Plan of Care (Signed)
Pt dc home with HH per MD order, IV removed, dc instructions and follow up gone over with pt and family   Problem: Education: Goal: Knowledge of General Education information will improve Description: Including pain rating scale, medication(s)/side effects and non-pharmacologic comfort measures Outcome: Adequate for Discharge   Problem: Health Behavior/Discharge Planning: Goal: Ability to manage health-related needs will improve Outcome: Adequate for Discharge   Problem: Clinical Measurements: Goal: Ability to maintain clinical measurements within normal limits will improve Outcome: Adequate for Discharge Goal: Will remain free from infection Outcome: Adequate for Discharge Goal: Diagnostic test results will improve Outcome: Adequate for Discharge Goal: Respiratory complications will improve Outcome: Adequate for Discharge Goal: Cardiovascular complication will be avoided Outcome: Adequate for Discharge   Problem: Activity: Goal: Risk for activity intolerance will decrease Outcome: Adequate for Discharge   Problem: Nutrition: Goal: Adequate nutrition will be maintained Outcome: Adequate for Discharge   Problem: Coping: Goal: Level of anxiety will decrease Outcome: Adequate for Discharge   Problem: Elimination: Goal: Will not experience complications related to bowel motility Outcome: Adequate for Discharge Goal: Will not experience complications related to urinary retention Outcome: Adequate for Discharge   Problem: Pain Managment: Goal: General experience of comfort will improve Outcome: Adequate for Discharge   Problem: Safety: Goal: Ability to remain free from injury will improve Outcome: Adequate for Discharge   Problem: Skin Integrity: Goal: Risk for impaired skin integrity will decrease Outcome: Adequate for Discharge   Problem: Education: Goal: Ability to identify signs and symptoms of gastrointestinal bleeding will improve Outcome: Adequate for  Discharge   Problem: Bowel/Gastric: Goal: Will show no signs and symptoms of gastrointestinal bleeding Outcome: Adequate for Discharge   Problem: Fluid Volume: Goal: Will show no signs and symptoms of excessive bleeding Outcome: Adequate for Discharge   Problem: Clinical Measurements: Goal: Complications related to the disease process, condition or treatment will be avoided or minimized Outcome: Adequate for Discharge

## 2020-01-22 ENCOUNTER — Encounter: Payer: PPO | Admitting: Cardiovascular Disease

## 2020-01-22 LAB — TYPE AND SCREEN
ABO/RH(D): O POS
Antibody Screen: NEGATIVE
Unit division: 0
Unit division: 0

## 2020-01-22 LAB — BPAM RBC
Blood Product Expiration Date: 202112072359
Blood Product Expiration Date: 202112092359
Unit Type and Rh: 5100
Unit Type and Rh: 5100

## 2020-01-22 NOTE — Progress Notes (Signed)
Will talk to him today

## 2020-01-23 DIAGNOSIS — I130 Hypertensive heart and chronic kidney disease with heart failure and stage 1 through stage 4 chronic kidney disease, or unspecified chronic kidney disease: Secondary | ICD-10-CM | POA: Diagnosis not present

## 2020-01-23 DIAGNOSIS — E785 Hyperlipidemia, unspecified: Secondary | ICD-10-CM | POA: Diagnosis not present

## 2020-01-23 DIAGNOSIS — I890 Lymphedema, not elsewhere classified: Secondary | ICD-10-CM | POA: Diagnosis not present

## 2020-01-23 DIAGNOSIS — I4821 Permanent atrial fibrillation: Secondary | ICD-10-CM | POA: Diagnosis not present

## 2020-01-23 DIAGNOSIS — I255 Ischemic cardiomyopathy: Secondary | ICD-10-CM | POA: Diagnosis not present

## 2020-01-23 DIAGNOSIS — Z8546 Personal history of malignant neoplasm of prostate: Secondary | ICD-10-CM | POA: Diagnosis not present

## 2020-01-23 DIAGNOSIS — N1831 Chronic kidney disease, stage 3a: Secondary | ICD-10-CM | POA: Diagnosis not present

## 2020-01-23 DIAGNOSIS — I2510 Atherosclerotic heart disease of native coronary artery without angina pectoris: Secondary | ICD-10-CM | POA: Diagnosis not present

## 2020-01-23 DIAGNOSIS — I071 Rheumatic tricuspid insufficiency: Secondary | ICD-10-CM | POA: Diagnosis not present

## 2020-01-23 DIAGNOSIS — Z9181 History of falling: Secondary | ICD-10-CM | POA: Diagnosis not present

## 2020-01-23 DIAGNOSIS — Z8673 Personal history of transient ischemic attack (TIA), and cerebral infarction without residual deficits: Secondary | ICD-10-CM | POA: Diagnosis not present

## 2020-01-23 DIAGNOSIS — I959 Hypotension, unspecified: Secondary | ICD-10-CM | POA: Diagnosis not present

## 2020-01-23 DIAGNOSIS — Z87891 Personal history of nicotine dependence: Secondary | ICD-10-CM | POA: Diagnosis not present

## 2020-01-23 DIAGNOSIS — I252 Old myocardial infarction: Secondary | ICD-10-CM | POA: Diagnosis not present

## 2020-01-23 DIAGNOSIS — H918X3 Other specified hearing loss, bilateral: Secondary | ICD-10-CM | POA: Diagnosis not present

## 2020-01-23 DIAGNOSIS — Z951 Presence of aortocoronary bypass graft: Secondary | ICD-10-CM | POA: Diagnosis not present

## 2020-01-23 DIAGNOSIS — Z95810 Presence of automatic (implantable) cardiac defibrillator: Secondary | ICD-10-CM | POA: Diagnosis not present

## 2020-01-23 DIAGNOSIS — I5042 Chronic combined systolic (congestive) and diastolic (congestive) heart failure: Secondary | ICD-10-CM | POA: Diagnosis not present

## 2020-01-23 DIAGNOSIS — D631 Anemia in chronic kidney disease: Secondary | ICD-10-CM | POA: Diagnosis not present

## 2020-01-23 DIAGNOSIS — Z950 Presence of cardiac pacemaker: Secondary | ICD-10-CM | POA: Diagnosis not present

## 2020-01-26 ENCOUNTER — Inpatient Hospital Stay
Admission: EM | Admit: 2020-01-26 | Discharge: 2020-01-31 | DRG: 871 | Disposition: A | Discharge status: Home Health | Payer: PPO | Attending: Internal Medicine | Admitting: Internal Medicine

## 2020-01-26 ENCOUNTER — Other Ambulatory Visit: Payer: Self-pay

## 2020-01-26 ENCOUNTER — Emergency Department: Payer: PPO

## 2020-01-26 DIAGNOSIS — Z7689 Persons encountering health services in other specified circumstances: Secondary | ICD-10-CM | POA: Diagnosis not present

## 2020-01-26 DIAGNOSIS — Z9889 Other specified postprocedural states: Secondary | ICD-10-CM

## 2020-01-26 DIAGNOSIS — R000 Tachycardia, unspecified: Secondary | ICD-10-CM | POA: Diagnosis not present

## 2020-01-26 DIAGNOSIS — I5042 Chronic combined systolic (congestive) and diastolic (congestive) heart failure: Secondary | ICD-10-CM | POA: Diagnosis present

## 2020-01-26 DIAGNOSIS — I517 Cardiomegaly: Secondary | ICD-10-CM | POA: Diagnosis not present

## 2020-01-26 DIAGNOSIS — L89302 Pressure ulcer of unspecified buttock, stage 2: Secondary | ICD-10-CM | POA: Diagnosis present

## 2020-01-26 DIAGNOSIS — I890 Lymphedema, not elsewhere classified: Secondary | ICD-10-CM | POA: Diagnosis not present

## 2020-01-26 DIAGNOSIS — I10 Essential (primary) hypertension: Secondary | ICD-10-CM | POA: Diagnosis not present

## 2020-01-26 DIAGNOSIS — I5043 Acute on chronic combined systolic (congestive) and diastolic (congestive) heart failure: Secondary | ICD-10-CM | POA: Diagnosis not present

## 2020-01-26 DIAGNOSIS — R5381 Other malaise: Secondary | ICD-10-CM | POA: Diagnosis present

## 2020-01-26 DIAGNOSIS — J9601 Acute respiratory failure with hypoxia: Secondary | ICD-10-CM | POA: Diagnosis not present

## 2020-01-26 DIAGNOSIS — J9621 Acute and chronic respiratory failure with hypoxia: Secondary | ICD-10-CM | POA: Diagnosis not present

## 2020-01-26 DIAGNOSIS — A419 Sepsis, unspecified organism: Secondary | ICD-10-CM | POA: Diagnosis not present

## 2020-01-26 DIAGNOSIS — R0902 Hypoxemia: Secondary | ICD-10-CM | POA: Diagnosis not present

## 2020-01-26 DIAGNOSIS — R6520 Severe sepsis without septic shock: Secondary | ICD-10-CM | POA: Diagnosis not present

## 2020-01-26 DIAGNOSIS — I9589 Other hypotension: Secondary | ICD-10-CM | POA: Diagnosis not present

## 2020-01-26 DIAGNOSIS — I472 Ventricular tachycardia: Secondary | ICD-10-CM | POA: Diagnosis not present

## 2020-01-26 DIAGNOSIS — Z8673 Personal history of transient ischemic attack (TIA), and cerebral infarction without residual deficits: Secondary | ICD-10-CM

## 2020-01-26 DIAGNOSIS — E1122 Type 2 diabetes mellitus with diabetic chronic kidney disease: Secondary | ICD-10-CM | POA: Diagnosis not present

## 2020-01-26 DIAGNOSIS — I255 Ischemic cardiomyopathy: Secondary | ICD-10-CM | POA: Diagnosis present

## 2020-01-26 DIAGNOSIS — Z7401 Bed confinement status: Secondary | ICD-10-CM

## 2020-01-26 DIAGNOSIS — Z20822 Contact with and (suspected) exposure to covid-19: Secondary | ICD-10-CM | POA: Diagnosis present

## 2020-01-26 DIAGNOSIS — J90 Pleural effusion, not elsewhere classified: Secondary | ICD-10-CM

## 2020-01-26 DIAGNOSIS — K5900 Constipation, unspecified: Secondary | ICD-10-CM | POA: Diagnosis not present

## 2020-01-26 DIAGNOSIS — H9193 Unspecified hearing loss, bilateral: Secondary | ICD-10-CM | POA: Diagnosis present

## 2020-01-26 DIAGNOSIS — R55 Syncope and collapse: Secondary | ICD-10-CM | POA: Diagnosis not present

## 2020-01-26 DIAGNOSIS — Z95810 Presence of automatic (implantable) cardiac defibrillator: Secondary | ICD-10-CM | POA: Diagnosis present

## 2020-01-26 DIAGNOSIS — I361 Nonrheumatic tricuspid (valve) insufficiency: Secondary | ICD-10-CM | POA: Diagnosis not present

## 2020-01-26 DIAGNOSIS — I4821 Permanent atrial fibrillation: Secondary | ICD-10-CM | POA: Diagnosis not present

## 2020-01-26 DIAGNOSIS — Z79899 Other long term (current) drug therapy: Secondary | ICD-10-CM

## 2020-01-26 DIAGNOSIS — R918 Other nonspecific abnormal finding of lung field: Secondary | ICD-10-CM | POA: Diagnosis not present

## 2020-01-26 DIAGNOSIS — I959 Hypotension, unspecified: Secondary | ICD-10-CM | POA: Diagnosis not present

## 2020-01-26 DIAGNOSIS — R0600 Dyspnea, unspecified: Secondary | ICD-10-CM | POA: Diagnosis not present

## 2020-01-26 DIAGNOSIS — I130 Hypertensive heart and chronic kidney disease with heart failure and stage 1 through stage 4 chronic kidney disease, or unspecified chronic kidney disease: Secondary | ICD-10-CM | POA: Diagnosis present

## 2020-01-26 DIAGNOSIS — I25110 Atherosclerotic heart disease of native coronary artery with unstable angina pectoris: Secondary | ICD-10-CM | POA: Diagnosis present

## 2020-01-26 DIAGNOSIS — I2489 Other forms of acute ischemic heart disease: Secondary | ICD-10-CM

## 2020-01-26 DIAGNOSIS — E876 Hypokalemia: Secondary | ICD-10-CM | POA: Diagnosis present

## 2020-01-26 DIAGNOSIS — N1831 Chronic kidney disease, stage 3a: Secondary | ICD-10-CM | POA: Diagnosis not present

## 2020-01-26 DIAGNOSIS — Z951 Presence of aortocoronary bypass graft: Secondary | ICD-10-CM | POA: Diagnosis not present

## 2020-01-26 DIAGNOSIS — J9811 Atelectasis: Secondary | ICD-10-CM | POA: Diagnosis not present

## 2020-01-26 DIAGNOSIS — J9691 Respiratory failure, unspecified with hypoxia: Secondary | ICD-10-CM | POA: Diagnosis present

## 2020-01-26 DIAGNOSIS — R7989 Other specified abnormal findings of blood chemistry: Secondary | ICD-10-CM | POA: Diagnosis not present

## 2020-01-26 DIAGNOSIS — I441 Atrioventricular block, second degree: Secondary | ICD-10-CM | POA: Diagnosis not present

## 2020-01-26 DIAGNOSIS — R0602 Shortness of breath: Secondary | ICD-10-CM

## 2020-01-26 DIAGNOSIS — E785 Hyperlipidemia, unspecified: Secondary | ICD-10-CM | POA: Diagnosis present

## 2020-01-26 DIAGNOSIS — J690 Pneumonitis due to inhalation of food and vomit: Secondary | ICD-10-CM | POA: Diagnosis present

## 2020-01-26 DIAGNOSIS — W19XXXA Unspecified fall, initial encounter: Secondary | ICD-10-CM | POA: Diagnosis not present

## 2020-01-26 DIAGNOSIS — Z9114 Patient's other noncompliance with medication regimen: Secondary | ICD-10-CM

## 2020-01-26 DIAGNOSIS — I248 Other forms of acute ischemic heart disease: Secondary | ICD-10-CM

## 2020-01-26 DIAGNOSIS — Z9861 Coronary angioplasty status: Secondary | ICD-10-CM | POA: Diagnosis not present

## 2020-01-26 DIAGNOSIS — N183 Chronic kidney disease, stage 3 unspecified: Secondary | ICD-10-CM | POA: Diagnosis present

## 2020-01-26 DIAGNOSIS — J189 Pneumonia, unspecified organism: Secondary | ICD-10-CM | POA: Diagnosis not present

## 2020-01-26 DIAGNOSIS — D638 Anemia in other chronic diseases classified elsewhere: Secondary | ICD-10-CM | POA: Diagnosis not present

## 2020-01-26 DIAGNOSIS — I252 Old myocardial infarction: Secondary | ICD-10-CM

## 2020-01-26 DIAGNOSIS — I5023 Acute on chronic systolic (congestive) heart failure: Secondary | ICD-10-CM | POA: Diagnosis not present

## 2020-01-26 DIAGNOSIS — I2510 Atherosclerotic heart disease of native coronary artery without angina pectoris: Secondary | ICD-10-CM | POA: Diagnosis not present

## 2020-01-26 DIAGNOSIS — J811 Chronic pulmonary edema: Secondary | ICD-10-CM

## 2020-01-26 DIAGNOSIS — Z955 Presence of coronary angioplasty implant and graft: Secondary | ICD-10-CM

## 2020-01-26 DIAGNOSIS — I712 Thoracic aortic aneurysm, without rupture: Secondary | ICD-10-CM | POA: Diagnosis not present

## 2020-01-26 DIAGNOSIS — R296 Repeated falls: Secondary | ICD-10-CM | POA: Diagnosis present

## 2020-01-26 DIAGNOSIS — Z87891 Personal history of nicotine dependence: Secondary | ICD-10-CM

## 2020-01-26 DIAGNOSIS — Z8546 Personal history of malignant neoplasm of prostate: Secondary | ICD-10-CM

## 2020-01-26 LAB — URINALYSIS, ROUTINE W REFLEX MICROSCOPIC
Bilirubin Urine: NEGATIVE
Glucose, UA: NEGATIVE mg/dL
Hgb urine dipstick: NEGATIVE
Ketones, ur: NEGATIVE mg/dL
Leukocytes,Ua: NEGATIVE
Nitrite: NEGATIVE
Protein, ur: NEGATIVE mg/dL
Specific Gravity, Urine: 1.017 (ref 1.005–1.030)
pH: 5 (ref 5.0–8.0)

## 2020-01-26 LAB — CBC WITH DIFFERENTIAL/PLATELET
Abs Immature Granulocytes: 0.02 10*3/uL (ref 0.00–0.07)
Basophils Absolute: 0 10*3/uL (ref 0.0–0.1)
Basophils Relative: 1 %
Eosinophils Absolute: 0 10*3/uL (ref 0.0–0.5)
Eosinophils Relative: 1 %
HCT: 28.6 % — ABNORMAL LOW (ref 39.0–52.0)
Hemoglobin: 8.6 g/dL — ABNORMAL LOW (ref 13.0–17.0)
Immature Granulocytes: 0 %
Lymphocytes Relative: 10 %
Lymphs Abs: 0.5 10*3/uL — ABNORMAL LOW (ref 0.7–4.0)
MCH: 27.1 pg (ref 26.0–34.0)
MCHC: 30.1 g/dL (ref 30.0–36.0)
MCV: 90.2 fL (ref 80.0–100.0)
Monocytes Absolute: 0.4 10*3/uL (ref 0.1–1.0)
Monocytes Relative: 7 %
Neutro Abs: 4.4 10*3/uL (ref 1.7–7.7)
Neutrophils Relative %: 81 %
Platelets: 305 10*3/uL (ref 150–400)
RBC: 3.17 MIL/uL — ABNORMAL LOW (ref 4.22–5.81)
RDW: 19 % — ABNORMAL HIGH (ref 11.5–15.5)
WBC: 5.4 10*3/uL (ref 4.0–10.5)
nRBC: 0.4 % — ABNORMAL HIGH (ref 0.0–0.2)

## 2020-01-26 LAB — COMPREHENSIVE METABOLIC PANEL
ALT: 16 U/L (ref 0–44)
AST: 35 U/L (ref 15–41)
Albumin: 2.6 g/dL — ABNORMAL LOW (ref 3.5–5.0)
Alkaline Phosphatase: 139 U/L — ABNORMAL HIGH (ref 38–126)
Anion gap: 13 (ref 5–15)
BUN: 14 mg/dL (ref 8–23)
CO2: 22 mmol/L (ref 22–32)
Calcium: 8.6 mg/dL — ABNORMAL LOW (ref 8.9–10.3)
Chloride: 105 mmol/L (ref 98–111)
Creatinine, Ser: 1.2 mg/dL (ref 0.61–1.24)
GFR, Estimated: >60 mL/min (ref >60)
Glucose, Bld: 153 mg/dL — ABNORMAL HIGH (ref 70–99)
Potassium: 3.7 mmol/L (ref 3.5–5.1)
Sodium: 140 mmol/L (ref 135–145)
Total Bilirubin: 0.9 mg/dL (ref 0.3–1.2)
Total Protein: 5.6 g/dL — ABNORMAL LOW (ref 6.5–8.1)

## 2020-01-26 LAB — BLOOD GAS, ARTERIAL
Acid-base deficit: 1.1 mmol/L (ref 0.0–2.0)
Bicarbonate: 23.5 mmol/L (ref 20.0–28.0)
Delivery systems: POSITIVE
Expiratory PAP: 4
FIO2: 0.32
Inspiratory PAP: 8
O2 Saturation: 97.7 %
Patient temperature: 37
pCO2 arterial: 38 mmHg (ref 32.0–48.0)
pH, Arterial: 7.4 (ref 7.350–7.450)
pO2, Arterial: 100 mmHg (ref 83.0–108.0)

## 2020-01-26 LAB — TROPONIN I (HIGH SENSITIVITY)
Troponin I (High Sensitivity): 35 ng/L — ABNORMAL HIGH (ref <18)
Troponin I (High Sensitivity): 83 ng/L — ABNORMAL HIGH (ref <18)
Troponin I (High Sensitivity): 168 ng/L (ref <18)
Troponin I (High Sensitivity): 192 ng/L (ref <18)

## 2020-01-26 LAB — EXPECTORATED SPUTUM ASSESSMENT W GRAM STAIN, RFLX TO RESP C

## 2020-01-26 LAB — RESPIRATORY PANEL BY RT PCR (FLU A&B, COVID)
Influenza A by PCR: NEGATIVE
Influenza B by PCR: NEGATIVE
SARS Coronavirus 2 by RT PCR: NEGATIVE

## 2020-01-26 LAB — BRAIN NATRIURETIC PEPTIDE: B Natriuretic Peptide: 266.7 pg/mL — ABNORMAL HIGH (ref 0.0–100.0)

## 2020-01-26 LAB — PROCALCITONIN: Procalcitonin: 2.88 ng/mL

## 2020-01-26 MED ORDER — GUAIFENESIN-DM 100-10 MG/5ML PO SYRP: 5.0000 mL | ORAL_SOLUTION | ORAL | Status: DC | PRN

## 2020-01-26 MED ORDER — ONDANSETRON HCL 4 MG PO TABS: 4.0000 mg | ORAL_TABLET | Freq: Four times a day (QID) | ORAL | Status: DC | PRN

## 2020-01-26 MED ORDER — ACETAMINOPHEN 325 MG PO TABS: 650.0000 mg | ORAL_TABLET | Freq: Four times a day (QID) | ORAL | Status: DC | PRN

## 2020-01-26 MED ORDER — FUROSEMIDE 10 MG/ML IJ SOLN
40.0000 mg | Freq: Two times a day (BID) | INTRAMUSCULAR | Status: DC
  Filled 2020-01-26: qty 4

## 2020-01-26 MED ORDER — IOHEXOL 350 MG/ML SOLN
75.0000 mL | Freq: Once | INTRAVENOUS | Status: AC | PRN
  Administered 2020-01-26: 75 mL via INTRAVENOUS

## 2020-01-26 MED ORDER — SODIUM CHLORIDE 0.9% FLUSH: 3.0000 mL | INTRAVENOUS | Status: DC | PRN

## 2020-01-26 MED ORDER — ASPIRIN 81 MG PO CHEW
81.0000 mg | CHEWABLE_TABLET | Freq: Every day | ORAL | Status: DC
  Administered 2020-01-26 – 2020-01-27 (×2): 81 mg via ORAL
  Filled 2020-01-26 (×2): qty 1

## 2020-01-26 MED ORDER — FUROSEMIDE 10 MG/ML IJ SOLN
40.0000 mg | Freq: Once | INTRAMUSCULAR | Status: AC
  Administered 2020-01-26: 40 mg via INTRAVENOUS

## 2020-01-26 MED ORDER — PANTOPRAZOLE SODIUM 40 MG PO TBEC
40.0000 mg | DELAYED_RELEASE_TABLET | Freq: Two times a day (BID) | ORAL | Status: DC
  Administered 2020-01-26 – 2020-01-31 (×10): 40 mg via ORAL
  Filled 2020-01-26 (×10): qty 1

## 2020-01-26 MED ORDER — SODIUM CHLORIDE 0.9 % IV SOLN
500.0000 mg | INTRAVENOUS | Status: AC
  Administered 2020-01-26 – 2020-01-30 (×5): 500 mg via INTRAVENOUS
  Filled 2020-01-26 (×5): qty 500

## 2020-01-26 MED ORDER — SODIUM CHLORIDE 0.9 % IV SOLN: 250.0000 mL | INTRAVENOUS | Status: DC | PRN

## 2020-01-26 MED ORDER — SODIUM CHLORIDE 0.9 % IV SOLN
1.0000 g | INTRAVENOUS | Status: DC
  Administered 2020-01-26: 1 g via INTRAVENOUS
  Filled 2020-01-26 (×2): qty 10

## 2020-01-26 MED ORDER — NITROGLYCERIN 0.4 MG SL SUBL: 0.4000 mg | SUBLINGUAL_TABLET | SUBLINGUAL | Status: DC | PRN

## 2020-01-26 MED ORDER — MIDODRINE HCL 5 MG PO TABS
5.0000 mg | ORAL_TABLET | Freq: Three times a day (TID) | ORAL | Status: DC
  Administered 2020-01-26 – 2020-01-27 (×4): 5 mg via ORAL
  Filled 2020-01-26 (×7): qty 1

## 2020-01-26 MED ORDER — POLYETHYLENE GLYCOL 3350 17 G PO PACK: 17.0000 g | PACK | Freq: Every day | ORAL | Status: DC | PRN

## 2020-01-26 MED ORDER — FUROSEMIDE 10 MG/ML IJ SOLN
40.0000 mg | Freq: Once | INTRAMUSCULAR | Status: AC
  Administered 2020-01-26: 40 mg via INTRAVENOUS
  Filled 2020-01-26: qty 4

## 2020-01-26 MED ORDER — ENOXAPARIN SODIUM 40 MG/0.4ML ~~LOC~~ SOLN
40.0000 mg | SUBCUTANEOUS | Status: DC
  Administered 2020-01-27: 40 mg via SUBCUTANEOUS
  Filled 2020-01-26: qty 0.4

## 2020-01-26 MED ORDER — ACETAMINOPHEN 650 MG RE SUPP: 650.0000 mg | Freq: Four times a day (QID) | RECTAL | Status: DC | PRN

## 2020-01-26 MED ORDER — ONDANSETRON HCL 4 MG/2ML IJ SOLN: 4.0000 mg | Freq: Four times a day (QID) | INTRAMUSCULAR | Status: DC | PRN

## 2020-01-26 MED ORDER — SODIUM CHLORIDE 0.9% FLUSH
3.0000 mL | Freq: Two times a day (BID) | INTRAVENOUS | Status: DC
  Administered 2020-01-26 – 2020-01-31 (×9): 3 mL via INTRAVENOUS

## 2020-01-26 MED ORDER — IPRATROPIUM-ALBUTEROL 0.5-2.5 (3) MG/3ML IN SOLN
3.0000 mL | Freq: Four times a day (QID) | RESPIRATORY_TRACT | Status: DC
  Administered 2020-01-26 – 2020-01-28 (×9): 3 mL via RESPIRATORY_TRACT
  Filled 2020-01-26 (×10): qty 3

## 2020-01-26 MED ORDER — BISACODYL 5 MG PO TBEC: 5.0000 mg | DELAYED_RELEASE_TABLET | Freq: Every day | ORAL | Status: DC | PRN

## 2020-01-26 MED ORDER — MIDODRINE HCL 5 MG PO TABS: 5.0000 mg | ORAL_TABLET | Freq: Three times a day (TID) | ORAL | Status: DC

## 2020-01-26 MED ORDER — SIMVASTATIN 20 MG PO TABS
20.0000 mg | ORAL_TABLET | Freq: Every day | ORAL | Status: DC
  Administered 2020-01-27 – 2020-01-31 (×5): 20 mg via ORAL
  Filled 2020-01-26: qty 1
  Filled 2020-01-26: qty 2
  Filled 2020-01-26 (×4): qty 1

## 2020-01-26 MED ORDER — GUAIFENESIN ER 600 MG PO TB12
600.0000 mg | ORAL_TABLET | Freq: Two times a day (BID) | ORAL | Status: DC
  Administered 2020-01-26 – 2020-01-31 (×10): 600 mg via ORAL
  Filled 2020-01-26 (×10): qty 1

## 2020-01-26 NOTE — ED Notes (Signed)
Attempted to call report, floor unable to take at this time.  To call back.

## 2020-01-26 NOTE — ED Notes (Signed)
Critical lab,trop 83 reported.  Provider [place] notified.

## 2020-01-26 NOTE — ED Notes (Addendum)
Elevated troponin reported 168.  Provider Val Riles notified via chat

## 2020-01-26 NOTE — ED Provider Notes (Signed)
[facility] Emergency Department Provider Note   ____________________________________________   First MD Initiated Contact with Patient 01/26/20 [phone]     (approximate)  I have reviewed the triage vital signs and the nursing notes.   HISTORY  Chief Complaint Shortness of Breath and Fall    HPI Thomas [name] is a 85 y.o. male with a history of pacemaker/defibrillator, CKD, CHF, legal deafness, hypertension, and CAD status post CABG who presents via EMS after being found down between the toilet and the bathtub in his bathroom.  On arrival, EMS state patient's oxygen saturation was 47 on room air with shallow breathing.  EMS placed patient on CPAP which she tolerated well with sats rising to 91%.  Patient arrives on 15 L nonrebreather in mild respiratory distress.  Patient unable to participate in history or review of systems         Past Medical History:  Diagnosis Date  . AICD (automatic cardioverter/defibrillator) present   . Bacteremia due to group B Streptococcus    Recurrent admissions for group B Strepotococcus bacteremia of unknown source with TEEs negative for vegetation 06/2019 and 08/2019  . Biventricular ICD (implantable cardioverter-defibrillator) in place 03/24/2005   Implantation of a Medtronic Adapta ADDRO1, serial number [id number]  . CHF (congestive heart failure) ([place])   . CKD (chronic kidney disease), stage III ([place])   . Coronary artery disease    a. s/p CABG 1986. b. Multiple PCIs/caths. c. 09/2013: s/p PTCA and BMS to SVG-OM.  [name]    Requires sign language interpreter  . Dysrhythmia   . History of abdominal aortic aneurysm   . History of bleeding peptic ulcer 1980  . History of epididymitis 2013  . HTN (hypertension)   . Hydronephrosis with ureteropelvic junction obstruction   . Hydroureter on left 2009  . Hypertension   . Ischemic cardiomyopathy    a. Prior EF 30-35%, s/p BIV-ICD. b. 09/2013: EF 45-50%.  . Moderate tricuspid  regurgitation   . PAF (paroxysmal atrial fibrillation) (HCC)    Not on [place] 2/2 GIB  . Presence of permanent cardiac pacemaker 2002   Original placed in 2002 for CHB then 2007 and 2014 device change out  . Prostate cancer ([place])   . Status post coronary artery bypass grafting 1986   LIMA to the LAD, SVG to OM, SVG to RCA  . Testicular swelling     Patient Active Problem List   Diagnosis Date Noted  . Respiratory failure with hypoxia ([place]) 01/26/2020  . Chronic diastolic CHF (congestive heart failure) ([place]) 01/15/2020  . Duodenal ulceration   . 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 [place] intertrigo 08/24/2019  . Edema of left upper extremity 08/24/2019  . Pneumonia of left lower lobe due to group B Streptococcus ([place]) 08/24/2019  . Heart block AV complete ([place]) 08/23/2019  . Sepsis due to group B Streptococcus ([place]) 08/20/2019  . Acute blood loss anemia 08/20/2019  . HCAP (healthcare-associated pneumonia) 08/18/2019  . Severe sepsis with septic shock ([place]) 08/18/2019  . Acute renal failure superimposed on stage 3a chronic kidney disease ([place]) 08/18/2019  . Black stool 08/18/2019  . Iron deficiency anemia 08/18/2019  . Group B streptococcal bacteriuria 06/18/2019  . Lactic acidosis 06/18/2019  . Pressure ulcer, stage 2 ([place]) 06/18/2019  . Bacteremia due to group B Streptococcus 06/17/2019  . SIRS (systemic inflammatory response syndrome) ([place]) 06/16/2019  . Leg swelling   . Decompensated hepatic cirrhosis ([place]) 05/22/2019  . Anasarca 05/19/2019  .  CRI (chronic renal insufficiency), stage 3 (moderate) ([place]) 01/30/2019  . Iron deficiency anemia due to chronic blood loss 11/02/2018  . Coronary artery disease of bypass graft of native heart with stable angina pectoris ([place]) 11/02/2018  . Pure hypercholesterolemia 11/02/2018  . Biventricular automatic implantable cardioverter defibrillator in situ 11/02/2018  . GIB (gastrointestinal bleeding) 09/14/2018  . Hypotension  08/14/2018  . Chronic systolic CHF (congestive heart failure) ([place]) 08/12/2018  . Acute on chronic combined systolic and diastolic CHF (congestive heart failure) ([place])   . GI bleed 05/26/2018  . Occult GI bleeding 05/25/2018  . Normocytic anemia 05/25/2018  . Elevated troponin 05/25/2018  . Lymphedema 02/28/2018  . Weakness 07/15/2016  . Fatigue 07/15/2016  . Stroke (cerebrum) ([place]) 10/31/2015  . Bulbous urethral stricture 09/18/2015  . Bilateral deafness 08/19/2015  . Bilateral cataracts 08/19/2015  . Acid reflux 08/19/2015  . Mixed hyperlipidemia 08/19/2015  . Essential hypertension 08/19/2015  . Myocardial infarction ([place]) 08/19/2015  . Calculus of kidney 08/19/2015  . Pacemaker 08/19/2015  . Gastroduodenal ulcer 08/19/2015  . Dupuytren's contracture of foot 08/19/2015  . Malignant neoplasm of prostate ([place]) 08/19/2015  . Microhematuria 08/19/2015  . CAD S/P multiple PCIs 02/22/2015  . Status post coronary artery bypass grafting 02/22/2015  . Benign essential HTN 04/02/2014  . Hematochezia 02/20/2014  . Tricuspid valve regurgitation, nonrheumatic 02/20/2014  . Mobitz type II atrioventricular block 12/12/2013  . Long term current use of anticoagulant 10/18/2013  . Cardiomyopathy, ischemic 11/11/2012  . Permanent atrial fibrillation ([place]) 11/11/2012  . Biventricular ICD in place 11/11/2012  . Chronic combined systolic and diastolic heart failure ([place]) 11/11/2012    Past Surgical History:  Procedure Laterality Date  . 2-D echocardiogram  11/20/2011   Ejection fraction 30-35% moderate concentric left ventricular hypertrophy. Left atrium is moderately dilated. Mild MR. Mild or  . BI-VENTRICULAR IMPLANTABLE CARDIOVERTER DEFIBRILLATOR N/A 12/16/2012   Procedure: BI-VENTRICULAR IMPLANTABLE CARDIOVERTER DEFIBRILLATOR  (CRT-D);  Surgeon: [name], MD;  Location[facility] CATH LAB;  Service: Cardiovascular;  Laterality: N/A;  . BIV ICD INSERTION CRT-D N/A 09/01/2019   Procedure: BIV  ICD INSERTION CRT-D;  Surgeon: [name], MD;  Location: [place] CV LAB;  Service: Cardiovascular;  Laterality: N/A;  . CARDIAC CATHETERIZATION  12/10/2011   SVG to OM widely patent.  LIMA to LAD patent  . CATARACT EXTRACTION W/PHACO Right 10/12/2017   Procedure: CATARACT EXTRACTION PHACO AND INTRAOCULAR LENS PLACEMENT (IOC);  Surgeon: Birder Robson, MD;  Location: [facility] ORS;  Service: Ophthalmology;  Laterality: Right;  [place] 00:57 AP% 15.9 CDE 9.07 Fluid pack lot # [id number]  . COLONOSCOPY N/A 07/13/2018   Procedure: COLONOSCOPY;  Surgeon: Toledo, Benay Pike, MD;  Location: [facility] ENDOSCOPY;  Service: Gastroenterology;  Laterality: N/A;  . CORONARY ARTERY BYPASS GRAFT  1986  . EMBOLIZATION N/A 01/18/2020   Procedure: EMBOLIZATION;  Surgeon: Algernon Huxley, MD;  Location: [place] CV LAB;  Service: Cardiovascular;  Laterality: N/A;  . ENTEROSCOPY N/A 09/14/2018   Procedure: ENTEROSCOPY;  Surgeon: Toledo, Benay Pike, MD;  Location: [facility] ENDOSCOPY;  Service: Gastroenterology;  Laterality: N/A;  symptomatic anemia, GI blood loss anemia, melena, positive small bowel capsule endoscopy showing source of bleeding   . ENTEROSCOPY N/A 08/22/2019   Procedure: ENTEROSCOPY;  Surgeon: Lin Landsman, MD;  Location: [facility] ENDOSCOPY;  Service: Gastroenterology;  Laterality: N/A;  . ESOPHAGOGASTRODUODENOSCOPY N/A 07/13/2018   Procedure: ESOPHAGOGASTRODUODENOSCOPY (EGD);  Surgeon: Toledo, Benay Pike, MD;  Location: [facility] ENDOSCOPY;  Service: Gastroenterology;  Laterality: N/A;  .  ESOPHAGOGASTRODUODENOSCOPY N/A 09/14/2018   Procedure: ESOPHAGOGASTRODUODENOSCOPY (EGD);  Surgeon: Toledo, Benay Pike, MD;  Location: [facility] ENDOSCOPY;  Service: Gastroenterology;  Laterality: N/A;  SIGN LANAGUAGE INTERPRETER  . ESOPHAGOGASTRODUODENOSCOPY N/A 06/21/2019   Procedure: ESOPHAGOGASTRODUODENOSCOPY (EGD);  Surgeon: Toledo, Benay Pike, MD;  Location: [facility] ENDOSCOPY;  Service: Gastroenterology;  Laterality: N/A;  .  ESOPHAGOGASTRODUODENOSCOPY N/A 01/15/2020   Procedure: ESOPHAGOGASTRODUODENOSCOPY (EGD);  Surgeon: Lucilla Lame, MD;  Location: [facility] ENDOSCOPY;  Service: Endoscopy;  Laterality: N/A;  . ESOPHAGOGASTRODUODENOSCOPY (EGD) WITH PROPOFOL N/A 05/27/2018   Procedure: ESOPHAGOGASTRODUODENOSCOPY (EGD) WITH PROPOFOL;  Surgeon: Clarene Essex, MD;  Location: [place];  Service: Endoscopy;  Laterality: N/A;  . ICD LEAD REMOVAL Left 08/25/2019   Procedure: ICD LEAD EXTRACTION;  Surgeon: [name], MD;  Location: [place];  Service: Cardiovascular;  Laterality: Left;  DR. Roxan Hockey BACK UP  . INSERT / REPLACE / REMOVE PACEMAKER    . LEFT HEART CATHETERIZATION WITH CORONARY/GRAFT ANGIOGRAM N/A 12/10/2011   Procedure: LEFT HEART CATHETERIZATION WITH Beatrix Fetters;  Surgeon: Sanda Klein, MD;  Location: [place] CATH LAB;  Service: Cardiovascular;  Laterality: N/A;  . LEFT HEART CATHETERIZATION WITH CORONARY/GRAFT ANGIOGRAM N/A 09/25/2013   Procedure: LEFT HEART CATHETERIZATION WITH Beatrix Fetters;  Surgeon: Blane Ohara, MD;  Location: [facility] CATH LAB;  Service: Cardiovascular;  Laterality: N/A;  . MULTIPLE EXTRACTIONS WITH ALVEOLOPLASTY Bilateral 08/30/2019   Procedure: Extractionof tooth #'s 2, 28, and 31 with alveoloplasty and gross debridement of remaining teeth.;  Surgeon: [name], DDS;  Location: [place];  Service: Oral Surgery;  Laterality: Bilateral;  . Persantine Myoview  05/06/2010   Post-rest ejection fraction 30%. No significant ischemia demonstrated. Compared to previous study there is no significant change.  . TEE WITHOUT CARDIOVERSION N/A 06/22/2019   Procedure: TRANSESOPHAGEAL ECHOCARDIOGRAM (TEE);  Surgeon: Minna Merritts, MD;  Location: [facility] ORS;  Service: Cardiovascular;  Laterality: N/A;  . TEE WITHOUT CARDIOVERSION N/A 08/23/2019   Procedure: TRANSESOPHAGEAL ECHOCARDIOGRAM (TEE);  Surgeon: Kate Sable, MD;  Location: [facility] ORS;  Service: Cardiovascular;   Laterality: N/A;  . TEE WITHOUT CARDIOVERSION N/A 08/25/2019   Procedure: TRANSESOPHAGEAL ECHOCARDIOGRAM (TEE);  Surgeon: [name], MD;  Location: [facility] OR;  Service: Cardiovascular;  Laterality: N/A;  . TRANSURETHRAL RESECTION OF PROSTATE     s/p    Prior to Admission medications   Medication Sig Start Date End Date Taking? Authorizing Provider  ferrous sulfate 325 (65 FE) MG tablet Take 1 tablet (325 mg total) by mouth 2 (two) times daily with a meal. Patient taking differently: Take 325 mg by mouth daily with breakfast.  09/11/19 01/26/20 Yes Mercy Riding, MD  losartan (COZAAR) 25 MG tablet Take 25 mg by mouth daily.   Yes [provider]  Multiple Vitamin (MULTIVITAMIN WITH MINERALS) TABS tablet Take 1 tablet by mouth daily. 05/29/19  Yes Nicole Kindred A, DO  simvastatin (ZOCOR) 20 MG tablet TAKE 1 TABLET BY MOUTH EVERY DAY Patient taking differently: Take 20 mg by mouth daily at 6 PM.  04/07/19  Yes Kilroy, Doreene Burke, PA-C  spironolactone (ALDACTONE) 25 MG tablet Take 1 tablet (25 mg total) by mouth daily. 08/01/19 01/26/20 Yes Hackney, Otila Kluver A, FNP  torsemide (DEMADEX) 20 MG tablet Take 2 tablets (40 mg total) by mouth 2 (two) times daily. 09/11/19  Yes Mercy Riding, MD  acetaminophen (TYLENOL) 325 MG tablet Take 2 tablets (650 mg total) by mouth every 6 (six) hours as needed for mild pain or fever. 08/23/19   Wieting, Richard,  MD  albuterol (PROVENTIL) (2.5 MG/3ML) 0.083% nebulizer solution Inhale 3 mLs into the lungs every 4 (four) hours as needed for wheezing or shortness of breath. 08/23/19   Wieting, Richard, MD  midodrine (PROAMATINE) 5 MG tablet TAKE 1 TABLET (5 MG TOTAL) BY MOUTH 3 (THREE) TIMES DAILY WITH MEALS. 01/23/20   [name], MD  Nystatin (GERHARDT'S BUTT CREAM) CREA Apply 1 application topically 2 (two) times daily. Patient taking differently: Apply 1 application topically as needed.  09/11/19   Mercy Riding, MD  ondansetron (ZOFRAN) 4 MG tablet Take 1 tablet (4  mg total) by mouth every 8 (eight) hours as needed for nausea or vomiting. Patient not taking: Reported on 01/26/2020 09/12/19 09/11/20  Mercy Riding, MD  pantoprazole (PROTONIX) 40 MG tablet Take 1 tablet (40 mg total) by mouth 2 (two) times daily. Patient not taking: Reported on 01/26/2020 01/21/20 02/20/20  Wyvonnia Dusky, MD    Allergies Delene Loll [sacubitril-valsartan], Phenazopyridine, and Ramipril  Family History  Problem Relation Age of Onset  . Hypertension Father     Social History Social History   Tobacco Use  . Smoking status: Former Smoker    Quit date: 03/15/1985    Years since quitting: 34.8  . Smokeless tobacco: Never Used  Vaping Use  . Vaping Use: Never used  Substance Use Topics  . Alcohol use: No    Comment: occas.  . Drug use: No    Review of Systems Unable to assess  ____________________________________________   PHYSICAL EXAM:  VITAL SIGNS: ED Triage Vitals  Enc Vitals Group     BP 01/26/20 0942 (!) 118/57     Pulse Rate 01/26/20 0942 (!) 103     Resp 01/26/20 0942 (!) 26     Temp 01/26/20 0942 98.6 F (37 C)     Temp Source 01/26/20 0942 Axillary     SpO2 01/26/20 0942 95 %     Weight 01/26/20 0944 175 lb (79.4 kg)     Height 01/26/20 0944 5\' 10"  (1.778 m)     Head Circumference --      Peak Flow --      Pain Score --      Pain Loc --      Pain Edu? --      Excl. in [place]? --    Constitutional: 15 L nonrebreather in place with mild respiratory distress Eyes: Conjunctivae are normal. PERRL. Head: Atraumatic. Nose: No congestion/rhinnorhea. Mouth/Throat: Mucous membranes are moist. Neck: No stridor Cardiovascular: Grossly normal heart sounds.  Good peripheral circulation. Respiratory: Use of accessory muscles.  Tachypneic.  Rales over bilateral lung fields Gastrointestinal: Soft and nontender. No distention. Musculoskeletal: No obvious deformities Neurologic:  Normal speech and language. No gross focal neurologic deficits are  appreciated. Skin:  Skin is warm and dry. No rash noted. Psychiatric: Mood and affect are normal. Speech and behavior are normal.  ____________________________________________   LABS (all labs ordered are listed, but only abnormal results are displayed)  Labs Reviewed  COMPREHENSIVE METABOLIC PANEL - Abnormal; Notable for the following components:      Result Value   Glucose, Bld 153 (*)    Calcium 8.6 (*)    Total Protein 5.6 (*)    Albumin 2.6 (*)    Alkaline Phosphatase 139 (*)    All other components within normal limits  CBC WITH DIFFERENTIAL/PLATELET - Abnormal; Notable for the following components:   RBC 3.17 (*)    Hemoglobin 8.6 (*)  HCT 28.6 (*)    RDW 19.0 (*)    nRBC 0.4 (*)    Lymphs Abs 0.5 (*)    All other components within normal limits  BRAIN NATRIURETIC PEPTIDE - Abnormal; Notable for the following components:   B Natriuretic Peptide 266.7 (*)    All other components within normal limits  TROPONIN I (HIGH SENSITIVITY) - Abnormal; Notable for the following components:   Troponin I (High Sensitivity) 35 (*)    All other components within normal limits  TROPONIN I (HIGH SENSITIVITY) - Abnormal; Notable for the following components:   Troponin I (High Sensitivity) 83 (*)    All other components within normal limits  RESPIRATORY PANEL BY RT PCR (FLU A&B, COVID)  EXPECTORATED SPUTUM ASSESSMENT W REFEX TO RESP CULTURE  URINALYSIS, ROUTINE W REFLEX MICROSCOPIC  BLOOD GAS, ARTERIAL  PROCALCITONIN  HEMOGLOBIN A1C  TROPONIN I (HIGH SENSITIVITY)   ____________________________________________  EKG  ED ECG REPORT I, Naaman Plummer, the attending physician, personally viewed and interpreted this ECG.  Date: 01/26/2020 EKG Time: 0941 Rate: 98 Rhythm: Atrial sensed, ventricularly paced complexes QRS Axis: normal Intervals: Left bundle branch block pattern ST/T Wave abnormalities: normal Narrative Interpretation: no evidence of acute  ischemia  ____________________________________________  RADIOLOGY  ED MD interpretation: Single view portable chest x-ray of the chest shows stable cardiomegaly with central pulmonary vascular congestion as well as stable bibasilar opacities concerning for pleural effusions.  No evidence of acute pneumonia, pneumothorax, or widened mediastinum  Official radiology report(s): CT Angio Chest PE W/Cm &/Or Wo Cm  Result Date: 01/26/2020 CLINICAL DATA:  Hypoxia EXAM: CT ANGIOGRAPHY CHEST WITH CONTRAST TECHNIQUE: Multidetector CT imaging of the chest was performed using the standard protocol during bolus administration of intravenous contrast. Multiplanar CT image reconstructions and MIPs were obtained to evaluate the vascular anatomy. CONTRAST:  49mL OMNIPAQUE IOHEXOL 350 MG/ML SOLN COMPARISON:  CT 05/19/2019 FINDINGS: Cardiovascular: Satisfactory opacification of the pulmonary arteries. No evidence of filling defect to the lobar branch level. Evaluation is somewhat limited by respiratory motion artifact and extensive streak and beam hardening artifact related to patient's pacemaker. Mid ascending thoracic aorta measures 5.0 cm in diameter (previously 4.8 cm on 05/19/2019). There is tortuosity of the descending thoracic aorta. Atherosclerotic calcification of the aorta and coronary arteries. Moderate four-chamber cardiomegaly. Status post CABG. Mediastinum/Nodes: There are a few mildly prominent mediastinal lymph nodes including index upper right paratracheal node measuring 12 mm (series 6, image 25), previously measured 9 mm. No axillary or hilar lymphadenopathy. Trachea, esophagus, and thyroid gland appear within normal limits. Lungs/Pleura: Moderate bilateral pleural effusions with associated compressive atelectasis. Patchy airspace opacities within the dependent portions of the bilateral lower lobes, right middle lobe, and lingula suspicious for superimposed pneumonia or aspiration. No pneumothorax. Upper  Abdomen: Reflux of contrast into the IVC and hepatic veins. Left hepatic lobe cyst. Musculoskeletal: No chest wall abnormality. No acute or significant osseous findings. Review of the MIP images confirms the above findings. IMPRESSION: 1. Negative for pulmonary embolism to the lobar branch level. 2. Moderate bilateral pleural effusions with associated compressive atelectasis. 3. Patchy airspace opacities within the dependent portions of the bilateral lower lobes, right middle lobe, and lingula suspicious for superimposed pneumonia or aspiration. 4. Mid ascending thoracic aortic aneurysm measuring up to 5.0 cm in diameter (previously 4.8 cm on 05/19/2019). Recommend semi-annual imaging followup by CTA or MRA and referral to cardiothoracic surgery if not already obtained. This recommendation follows 2010 ACCF/AHA/AATS/ACR/ASA/SCA/SCAI/SIR/STS/SVM Guidelines for the Diagnosis and Management of  Patients With Thoracic Aortic Disease. Circulation. 2010; 121: J941-D408. Aortic aneurysm NOS (ICD10-I71.9) 5. Cardiomegaly with evidence of right heart dysfunction. 6. Aortic and coronary artery atherosclerosis.  (ICD10-I70.0). Electronically Signed   By: Davina Poke D.O.   On: 01/26/2020 13:56   DG Chest Port 1 View  Result Date: 01/26/2020 CLINICAL DATA:  Shortness of breath. EXAM: PORTABLE CHEST 1 VIEW COMPARISON:  September 12, 2019. FINDINGS: Stable cardiomegaly. Right-sided pacemaker is unchanged in position. Central pulmonary vascular congestion is noted. No pneumothorax is noted. Stable bibasilar opacities are noted concerning for edema or atelectasis with associated pleural effusions. Bony thorax is unremarkable. IMPRESSION: Stable cardiomegaly and central pulmonary vascular congestion. Stable bibasilar opacities are noted concerning for edema or atelectasis with associated pleural effusions. Aortic Atherosclerosis (ICD10-I70.0). Electronically Signed   By: Marijo Conception M.D.   On: 01/26/2020 10:00     ____________________________________________   PROCEDURES  Procedure(s) performed (including Critical Care):  .Critical Care Performed by: Naaman Plummer, MD Authorized by: Naaman Plummer, MD   Critical care provider statement:    Critical care time (minutes):  49   Critical care time was exclusive of:  Separately billable procedures and treating other patients   Critical care was necessary to treat or prevent imminent or life-threatening deterioration of the following conditions:  Respiratory failure   Critical care was time spent personally by me on the following activities:  Discussions with consultants, evaluation of patient's response to treatment, examination of patient, ordering and performing treatments and interventions, ordering and review of laboratory studies, ordering and review of radiographic studies, pulse oximetry, re-evaluation of patient's condition, obtaining history from patient or surrogate and review of old charts   I assumed direction of critical care for this patient from another provider in my specialty: no   .1-3 Lead EKG Interpretation Performed by: Naaman Plummer, MD Authorized by: Naaman Plummer, MD     Interpretation: normal     ECG rate:  82   ECG rate assessment: normal     Rhythm: sinus rhythm     Ectopy: none     Conduction: normal       ____________________________________________   INITIAL IMPRESSION / ASSESSMENT AND PLAN / ED COURSE  As part of my medical decision making, I reviewed the following data within the [facility] [place] notes reviewed and incorporated, Labs reviewed, EKG interpreted, Old chart reviewed, Radiograph reviewed and Notes from prior ED visits reviewed and incorporated        + dyspnea +LE edema - Non adherence to medication regimen  Workup: ECG, CBC, BMP, Troponin, BNP, CXR Findings: EKG: No STEMI and no evidence of Brugadas sign, delta wave, epsilon wave, significantly prolonged QTc,  or malignant arrhythmia. BNP: 267 CXR: Central pulmonary vascular congestion and bilateral pleural effusions Based on history, exam and findings, presentation most consistent with acute on chronic heart failure. Low suspicion for PNA, ACS, tamponade, aortic dissection. Interventions: Oxygen, Diuresis  Reassessment: Symptoms improved in ED with oxygen and diuresis Disposition: Admit to medicine for further monitoring and for improvement of medication regimen to control symptoms.      ____________________________________________   FINAL CLINICAL IMPRESSION(S) / ED DIAGNOSES  Final diagnoses:  Acute respiratory failure with hypoxia (HCC)  Chronic pulmonary edema  Chronic bilateral pleural effusions     ED Discharge Orders    None       Note:  This document was prepared using Dragon voice recognition software and may include unintentional dictation  errors.   Naaman Plummer, MD 01/26/20 [phone]

## 2020-01-26 NOTE — H&P (Addendum)
[facility] History and Physical   Patient: Thomas [name] UXL:[id number]   PCP: [name], MD DOB: 07-10-1934   DOA: 01/26/2020   DOS: 01/26/2020   DOS: the patient was seen and examined on 01/26/2020   Patient coming from: The patient is coming from Home  Chief Complaint: Syncope and collapse due to acute hypoxic respiratory failure  HPI: Thomas [name] is a 85 y.o. male legally deaf communicates with sign language, with Past medical history of CAD s/p CABG, s/p multiple PCI, A. Fib not on AC due to h/o GIBleed, second-degree AV block s/p biventricular AICD, chronic systolic CHF LVEF 35 to 53%, tricuspid valve regurgitation, history of CVA without residual weakness, h/o GI bleed duodenal AVM, prostate cancer treated more than 20 years ago, as reviewed from EMR, presented at [facility] ED from home after being found down between the toilet and the bathtub in his bathroom with shallow breathing. HPI reviewed from ER physician chart and patient's son was at bedside who helped to find out the actual problem. As per them patient was having difficulty breathing in the morning and then he thought to take a shower after an hour he was found down in the bathroom. EMS was called and patient was found to have acute hyper respiratory failure O2 saturation 47% on room air, patient was placed on CPAP which he tolerated and O2 sat improved to 91%. On arrival in the ED patient was on 15 L nonrebreather and with mild respiratory distress and patient was placed on BiPAP. On my exam patient was resting comfortably wearing BiPAP, family was at bedside. Patient did not complain of any chest pain prior to this episode, no any headache or dizziness, no abdominal pain, no nausea vomiting or diarrhea.  Discussed management plan and they verbalized understanding.   ED Course: CBC WBC count within normal range, chronic anemia hemoglobin 8.6, BNP 266 slightly elevated, troponin 35--83 slightly elevated Mild  hyperglycemia blood glucose 153 CTA chest ruled out PE, Moderate bilateral pleural effusions with associated compressive Atelectasis. 3. Patchy airspace opacities within the dependent portions of the bilateral lower lobes, right middle lobe, and lingula suspicious for superimposed pneumonia or aspiration. 4. Mid ascending thoracic aortic aneurysm measuring up to 5.0 cm in diameter (previously 4.8 cm on 05/19/2019). Recommend semi-annual imaging followup by CTA or MRA and referral to cardiothoracic surgery if not already obtained.  CXR: Stable cardiomegaly and central pulmonary vascular congestion. Stable bibasilar opacities are noted concerning for edema or atelectasis with associated pleural effusions.     Review of Systems: as mentioned in the history of present illness.  All other systems reviewed and are negative.  Past Medical History:  Diagnosis Date  . AICD (automatic cardioverter/defibrillator) present   . Bacteremia due to group B Streptococcus    Recurrent admissions for group B Strepotococcus bacteremia of unknown source with TEEs negative for vegetation 06/2019 and 08/2019  . Biventricular ICD (implantable cardioverter-defibrillator) in place 03/24/2005   Implantation of a Med[employer]onic Adapta ADDRO1, serial number [id number]  . CHF (congestive heart failure) ([place])   . CKD (chronic kidney disease), stage III ([employer])   . Coronary artery disease    a. s/p CABG 1986. b. Multiple PCIs/caths. c. 09/2013: s/p PTCA and BMS to SVG-OM.  [name]    Requires sign language interpreter  . Dysrhythmia   . History of abdominal aortic aneurysm   . History of bleeding peptic ulcer 1980  . History of epididymitis 2013  . HTN (hypertension)   .  Hydronephrosis with ureteropelvic junction obstruction   . Hydroureter on left 2009  . Hypertension   . Ischemic cardiomyopathy    a. Prior EF 30-35%, s/p BIV-ICD. b. 09/2013: EF 45-50%.  . Moderate tricuspid regurgitation   . PAF (paroxysmal atrial  fibrillation) (HCC)    Not on [place] 2/2 GIB  . Presence of permanent cardiac pacemaker 2002   Original placed in 2002 for CHB then 2007 and 2014 device change out  . Prostate cancer ([place])   . Status post coronary artery bypass grafting 1986   LIMA to the LAD, SVG to OM, SVG to RCA  . Testicular swelling    Past Surgical History:  Procedure Laterality Date  . 2-D echocardiogram  11/20/2011   Ejection fraction 30-35% moderate concentric left ventricular hypertrophy. Left atrium is moderately dilated. Mild MR. Mild or  . BI-VENTRICULAR IMPLANTABLE CARDIOVERTER DEFIBRILLATOR N/A 12/16/2012   Procedure: BI-VENTRICULAR IMPLANTABLE CARDIOVERTER DEFIBRILLATOR  (CRT-D);  Surgeon: [name], MD;  Location: [facility] CATH LAB;  Service: Cardiovascular;  Laterality: N/A;  . BIV ICD INSERTION CRT-D N/A 09/01/2019   Procedure: BIV ICD INSERTION CRT-D;  Surgeon: [name], MD;  Location: [place] CV LAB;  Service: Cardiovascular;  Laterality: N/A;  . CARDIAC CATHETERIZATION  12/10/2011   SVG to OM widely patent.  LIMA to LAD patent  . CATARACT EXTRACTION W/PHACO Right 10/12/2017   Procedure: CATARACT EXTRACTION PHACO AND INTRAOCULAR LENS PLACEMENT (IOC);  Surgeon: Birder Robson, MD;  Location: [facility] ORS;  Service: Ophthalmology;  Laterality: Right;  [place] 00:57 AP% 15.9 CDE 9.07 Fluid pack lot # [id number]  . COLONOSCOPY N/A 07/13/2018   Procedure: COLONOSCOPY;  Surgeon: Toledo, Benay Pike, MD;  Location: [facility] ENDOSCOPY;  Service: Gastroenterology;  Laterality: N/A;  . CORONARY ARTERY BYPASS GRAFT  1986  . EMBOLIZATION N/A 01/18/2020   Procedure: EMBOLIZATION;  Surgeon: Algernon Huxley, MD;  Location: [place] CV LAB;  Service: Cardiovascular;  Laterality: N/A;  . ENTEROSCOPY N/A 09/14/2018   Procedure: ENTEROSCOPY;  Surgeon: Toledo, Benay Pike, MD;  Location: [facility] ENDOSCOPY;  Service: Gastroenterology;  Laterality: N/A;  symptomatic anemia, GI blood loss anemia, melena, positive small bowel capsule  endoscopy showing source of bleeding   . ENTEROSCOPY N/A 08/22/2019   Procedure: ENTEROSCOPY;  Surgeon: Lin Landsman, MD;  Location: [facility] ENDOSCOPY;  Service: Gastroenterology;  Laterality: N/A;  . ESOPHAGOGASTRODUODENOSCOPY N/A 07/13/2018   Procedure: ESOPHAGOGASTRODUODENOSCOPY (EGD);  Surgeon: Toledo, Benay Pike, MD;  Location: [facility] ENDOSCOPY;  Service: Gastroenterology;  Laterality: N/A;  . ESOPHAGOGASTRODUODENOSCOPY N/A 09/14/2018   Procedure: ESOPHAGOGASTRODUODENOSCOPY (EGD);  Surgeon: Toledo, Benay Pike, MD;  Location: [facility] ENDOSCOPY;  Service: Gastroenterology;  Laterality: N/A;  SIGN LANAGUAGE INTERPRETER  . ESOPHAGOGASTRODUODENOSCOPY N/A 06/21/2019   Procedure: ESOPHAGOGASTRODUODENOSCOPY (EGD);  Surgeon: Toledo, Benay Pike, MD;  Location: [facility] ENDOSCOPY;  Service: Gastroenterology;  Laterality: N/A;  . ESOPHAGOGASTRODUODENOSCOPY N/A 01/15/2020   Procedure: ESOPHAGOGASTRODUODENOSCOPY (EGD);  Surgeon: Lucilla Lame, MD;  Location: [facility] ENDOSCOPY;  Service: Endoscopy;  Laterality: N/A;  . ESOPHAGOGASTRODUODENOSCOPY (EGD) WITH PROPOFOL N/A 05/27/2018   Procedure: ESOPHAGOGASTRODUODENOSCOPY (EGD) WITH PROPOFOL;  Surgeon: Clarene Essex, MD;  Location: [place];  Service: Endoscopy;  Laterality: N/A;  . ICD LEAD REMOVAL Left 08/25/2019   Procedure: ICD LEAD EXTRACTION;  Surgeon: [name], MD;  Location: [place];  Service: Cardiovascular;  Laterality: Left;  DR. Roxan Hockey BACK UP  . INSERT / REPLACE / REMOVE PACEMAKER    . LEFT HEART CATHETERIZATION WITH CORONARY/GRAFT ANGIOGRAM N/A 12/10/2011   Procedure: LEFT HEART CATHETERIZATION  WITH Beatrix Fetters;  Surgeon: Sanda Klein, MD;  Location: [facility] CATH LAB;  Service: Cardiovascular;  Laterality: N/A;  . LEFT HEART CATHETERIZATION WITH CORONARY/GRAFT ANGIOGRAM N/A 09/25/2013   Procedure: LEFT HEART CATHETERIZATION WITH Beatrix Fetters;  Surgeon: Blane Ohara, MD;  Location: [facility] CATH LAB;  Service: Cardiovascular;  Laterality:  N/A;  . MULTIPLE EXTRACTIONS WITH ALVEOLOPLASTY Bilateral 08/30/2019   Procedure: Extractionof tooth #'s 2, 28, and 31 with alveoloplasty and gross debridement of remaining teeth.;  Surgeon: [name], DDS;  Location: [place];  Service: Oral Surgery;  Laterality: Bilateral;  . Persantine Myoview  05/06/2010   Post-rest ejection fraction 30%. No significant ischemia demonstrated. Compared to previous study there is no significant change.  . TEE WITHOUT CARDIOVERSION N/A 06/22/2019   Procedure: TRANSESOPHAGEAL ECHOCARDIOGRAM (TEE);  Surgeon: Minna Merritts, MD;  Location: [facility] ORS;  Service: Cardiovascular;  Laterality: N/A;  . TEE WITHOUT CARDIOVERSION N/A 08/23/2019   Procedure: TRANSESOPHAGEAL ECHOCARDIOGRAM (TEE);  Surgeon: Kate Sable, MD;  Location: [facility] ORS;  Service: Cardiovascular;  Laterality: N/A;  . TEE WITHOUT CARDIOVERSION N/A 08/25/2019   Procedure: TRANSESOPHAGEAL ECHOCARDIOGRAM (TEE);  Surgeon: [name], MD;  Location: [facility] OR;  Service: Cardiovascular;  Laterality: N/A;  . TRANSURETHRAL RESECTION OF PROSTATE     s/p   Social History:  reports that he quit smoking about 34 years ago. He has never used smokeless tobacco. He reports that he does not drink alcohol and does not use drugs.  Allergies  Allergen Reactions  . Entresto [Sacubitril-Valsartan] Swelling    And bruising of arm  . Phenazopyridine Nausea Only and Other (See Comments)    GI UPSET  . Ramipril Other (See Comments)    unk Other reaction(s): Other (See Comments), Unknown unk     Family history reviewed and not pertinent Family History  Problem Relation Age of Onset  . Hypertension Father      Prior to Admission medications   Medication Sig Start Date End Date Taking? Authorizing Provider  acetaminophen (TYLENOL) 325 MG tablet Take 2 tablets (650 mg total) by mouth every 6 (six) hours as needed for mild pain or fever. 08/23/19   Loletha Grayer, MD  albuterol (PROVENTIL) (2.5 MG/3ML)  0.083% nebulizer solution Inhale 3 mLs into the lungs every 4 (four) hours as needed for wheezing or shortness of breath. 08/23/19   Loletha Grayer, MD  ferrous sulfate 325 (65 FE) MG tablet Take 1 tablet (325 mg total) by mouth 2 (two) times daily with a meal. Patient taking differently: Take 325 mg by mouth daily with breakfast.  09/11/19 12/05/19  Mercy Riding, MD  losartan (COZAAR) 25 MG tablet Take 25 mg by mouth daily.    [provider]  midodrine (PROAMATINE) 5 MG tablet TAKE 1 TABLET (5 MG TOTAL) BY MOUTH 3 (THREE) TIMES DAILY WITH MEALS. 01/23/20   [name], MD  Multiple Vitamin (MULTIVITAMIN WITH MINERALS) TABS tablet Take 1 tablet by mouth daily. 05/29/19   Ezekiel Slocumb, DO  Nystatin (GERHARDT'S BUTT CREAM) CREA Apply 1 application topically 2 (two) times daily. Patient taking differently: Apply 1 application topically as needed.  09/11/19   Mercy Riding, MD  ondansetron (ZOFRAN) 4 MG tablet Take 1 tablet (4 mg total) by mouth every 8 (eight) hours as needed for nausea or vomiting. 09/12/19 09/11/20  Mercy Riding, MD  pantoprazole (PROTONIX) 40 MG tablet Take 1 tablet (40 mg total) by mouth 2 (two) times daily. 01/21/20 02/20/20  [name],  August Saucer, MD  simvastatin (ZOCOR) 20 MG tablet TAKE 1 TABLET BY MOUTH EVERY DAY 04/07/19   Erlene Quan, PA-C  spironolactone (ALDACTONE) 25 MG tablet Take 1 tablet (25 mg total) by mouth daily. 08/01/19 12/05/19  Alisa Graff, FNP  torsemide (DEMADEX) 20 MG tablet Take 2 tablets (40 mg total) by mouth 2 (two) times daily. 09/11/19   Mercy Riding, MD    Physical Exam: Vitals:   01/26/20 1100 01/26/20 1130 01/26/20 1200 01/26/20 1230  BP: (!) 101/55 (!) 102/56 (!) 96/59 (!) 102/57  Pulse: 77 72 67 70  Resp: (!) 28 (!) 30 (!) 24 (!) 24  Temp: 98.6 F (37 C)     TempSrc:      SpO2: 92% 92% 98% 98%  Weight:      Height:        General: Sleepy but arousable wearing BiPAP, mild respiratory distress affect appropriate, no  agitation noticed Eyes: PERRLA, Conjunctiva normal ENT: Patient was wearing BiPAP Neck: no JVD, no Abnormal Mass Or lumps Cardiovascular: S1 and S2 Present, + Murmur, peripheral pulses symmetrical Respiratory: Mild respiratory distress, bibasilar crackles, no significant wheezing appreciated Abdomen: Bowel Sound present, Soft and no tenderness, no hernia Skin: no rashes  Extremities: 2-3+ Pedal edema, no calf tenderness Neurologic: without any new focal findings Gait not checked due to patient safety concerns  Data Reviewed: I have personally reviewed and interpreted labs, imaging as discussed below.  CBC: Recent Labs  Lab 01/20/20 0437 01/21/20 0454 01/26/20 0946  WBC 7.0 5.6 5.4  NEUTROABS  --   --  4.4  HGB 7.7* 7.8* 8.6*  HCT 24.4* 24.7* 28.6*  MCV 87.8 87.6 90.2  PLT 191 179 097   Basic Metabolic Panel: Recent Labs  Lab 01/20/20 0437 01/21/20 0454 01/26/20 0946  NA 135 136 140  K 4.1 4.4 3.7  CL 107 109 105  CO2 21* 24 22  GLUCOSE 102* 98 153*  BUN 20 18 14   CREATININE 0.90 1.01 1.20  CALCIUM 8.5* 8.0* 8.6*   GFR: Estimated Creatinine Clearance: 49 mL/min (by C-G formula based on SCr of 1.2 mg/dL). Liver Function Tests: Recent Labs  Lab 01/26/20 0946  AST 35  ALT 16  ALKPHOS 139*  BILITOT 0.9  PROT 5.6*  ALBUMIN 2.6*   No results for input(s): LIPASE, AMYLASE in the last 168 hours. No results for input(s): AMMONIA in the last 168 hours. Coagulation Profile: No results for input(s): INR, PROTIME in the last 168 hours. Cardiac Enzymes: No results for input(s): CKTOTAL, CKMB, CKMBINDEX, TROPONINI in the last 168 hours. BNP (last 3 results) No results for input(s): PROBNP in the last 8760 hours. HbA1C: No results for input(s): HGBA1C in the last 72 hours. CBG: No results for input(s): GLUCAP in the last 168 hours. Lipid Profile: No results for input(s): CHOL, HDL, LDLCALC, TRIG, CHOLHDL, LDLDIRECT in the last 72 hours. Thyroid Function Tests: No  results for input(s): TSH, T4TOTAL, FREET4, T3FREE, THYROIDAB in the last 72 hours. Anemia Panel: No results for input(s): VITAMINB12, FOLATE, FERRITIN, TIBC, IRON, RETICCTPCT in the last 72 hours. Urine analysis:    Component Value Date/Time   COLORURINE YELLOW (A) 08/20/2019 0924   APPEARANCEUR HAZY (A) 08/20/2019 0924   APPEARANCEUR Clear 12/13/2017 0918   LABSPEC 1.015 08/20/2019 0924   LABSPEC 1.021 11/19/2011 1501   PHURINE 5.0 08/20/2019 0924   GLUCOSEU NEGATIVE 08/20/2019 0924   GLUCOSEU Negative 11/19/2011 1501   HGBUR NEGATIVE 08/20/2019 0924   BILIRUBINUR  NEGATIVE 08/20/2019 0924   BILIRUBINUR Negative 12/13/2017 0918   BILIRUBINUR Negative 11/19/2011 1501   KETONESUR NEGATIVE 08/20/2019 0924   PROTEINUR NEGATIVE 08/20/2019 0924   NITRITE NEGATIVE 08/20/2019 0924   LEUKOCYTESUR NEGATIVE 08/20/2019 0924   LEUKOCYTESUR Negative 11/19/2011 1501    Radiological Exams on Admission: DG Chest Port 1 View  Result Date: 01/26/2020 CLINICAL DATA:  Shortness of breath. EXAM: PORTABLE CHEST 1 VIEW COMPARISON:  September 12, 2019. FINDINGS: Stable cardiomegaly. Right-sided pacemaker is unchanged in position. Central pulmonary vascular congestion is noted. No pneumothorax is noted. Stable bibasilar opacities are noted concerning for edema or atelectasis with associated pleural effusions. Bony thorax is unremarkable. IMPRESSION: Stable cardiomegaly and central pulmonary vascular congestion. Stable bibasilar opacities are noted concerning for edema or atelectasis with associated pleural effusions. Aortic Atherosclerosis (ICD10-I70.0). Electronically Signed   By: Marijo Conception M.D.   On: 01/26/2020 10:00    I reviewed all nursing notes, pharmacy notes, vitals, pertinent old records.  Assessment/Plan Active Problems:   Respiratory failure with hypoxia (HCC)    # Acute hypoxic respiratory failure multifactorial could be secondary to CHF versus pneumonia CTA chest ruled out PE, positive  pleural effusion and atelectasis concerning for infiltrates/pneumonia WBC count within normal range, Follow procalcitonin Empirically started antibiotics ceftriaxone and azithromycin Sputum culture and blood culture Continue DuoNeb every 6 hourly, Mucinex twice daily Robitussin DM as needed, continue incentive spirometry and flutter valve Continue BiPAP as needed Follow ABG Consider thoracentesis if respiratory distress does not improve after diuresis   # Bilateral pleural effusion and bilateral lower extremity edema could be secondary to decompensated heart failure, BNP 266 slightly elevated Started Lasix 40 mg IV twice daily Monitor renal functions and urine output Repeat 2D echocardiogram   # Elevated troponin could be secondary demand ischemia due to hypoxia and CHF exacerbation Patient denied any chest pain Troponin 33--83--168 Started aspirin 81 mg daily, nitroglycerin as needed for chest pain Continue monitor on telemetry Repeat EKG tomorrow a.m. Trend troponin Repeat 2D echocardiogram Consider cardiology consult tomorrow a.m. for further management    # History of CAD s/p CABG,  multiple PCI, second-degree heart block s/p biventricular AICD, chronic systolic CHF LVEF 35 to 12%, tricuspid regurgitation, A. fib not on anticoagulation secondary to history of GI bleed. H/o hypertension but has low blood pressure, so patient is on midodrine. Continue midodrine and statin Monitor BP and titrate medication accordingly   # Anemia of chronic disease and history of GI bleed due to duodenal AVM Denied any active issues Hemoglobin 8.6 on admission Resumed PPI   # Legally deaf, communicates with sign language. Continue supportive care   Nutrition: Carb modified diet DVT Prophylaxis: Subcutaneous Lovenox  Advance goals of care discussion: Full code   Consults: None  Family Communication: family was present at bedside, at the time of interview.  Opportunity was given to  ask question and all questions were answered satisfactorily.  Disposition: Admitted as inpatient on telemetry, med-surge unit. Likely to be discharged home, in 2-3 days .  I have discussed plan of care as described above with RN and patient/family.  Severity of Illness: The appropriate patient status for this patient is INPATIENT. Inpatient status is judged to be reasonable and necessary in order to provide the required intensity of service to ensure the patient's safety. The patient's presenting symptoms, physical exam findings, and initial radiographic and laboratory data in the context of their chronic comorbidities is felt to place them at high risk for further clinical  deterioration. Furthermore, it is not anticipated that the patient will be medically stable for discharge from the hospital within 2 midnights of admission. The following factors support the patient status of inpatient.   " The patient's presenting symptoms include respiratory failure. " The worrisome physical exam findings include on BiPAP. " The initial radiographic and laboratory data are worrisome because of pneumonia, CHF, pleural effusion, elevated troponin. " The chronic co-morbidities include CAD, CHF, A. fib.   * I certify that at the point of admission it is my clinical judgment that the patient will require inpatient hospital care spanning beyond 2 midnights from the point of admission due to high intensity of service, high risk for further deterioration and high frequency of surveillance required.*    Author: Val Riles, MD [facility] 01/26/2020 1:31 PM   To reach On-call, see care teams to locate the attending and reach out to them via [email or web address]. If 7PM-7AM, please contact night-coverage If you still have difficulty reaching the attending provider, please page the [facility] (Director on Call) for [facility] on amion for assistance.

## 2020-01-26 NOTE — Plan of Care (Signed)
  Problem: Clinical Measurements: Goal: Ability to maintain clinical measurements within normal limits will improve Outcome: Progressing   Problem: Clinical Measurements: Goal: Will remain free from infection Outcome: Progressing   Problem: Clinical Measurements: Goal: Diagnostic test results will improve Outcome: Progressing   Problem: Clinical Measurements: Goal: Respiratory complications will improve Outcome: Progressing   Problem: Clinical Measurements: Goal: Cardiovascular complication will be avoided Outcome: Progressing   Problem: Activity: Goal: Ability to tolerate increased activity will improve Outcome: Progressing   Problem: Respiratory: Goal: Ability to maintain adequate ventilation will improve Outcome: Progressing

## 2020-01-26 NOTE — ED Triage Notes (Signed)
Pt arrived via EMS for fall/shortness of breath.  EMS states pt was found by wife in bathroom, between toilet and bathtub.  On arrival, reports O2 was 47% RA, shallow breathing.  EMS placed pt on CPAP, tolerated well, with sats rising to 91%.

## 2020-01-27 DIAGNOSIS — I4821 Permanent atrial fibrillation: Secondary | ICD-10-CM

## 2020-01-27 DIAGNOSIS — D649 Anemia, unspecified: Secondary | ICD-10-CM

## 2020-01-27 DIAGNOSIS — I2510 Atherosclerotic heart disease of native coronary artery without angina pectoris: Secondary | ICD-10-CM

## 2020-01-27 DIAGNOSIS — I10 Essential (primary) hypertension: Secondary | ICD-10-CM

## 2020-01-27 DIAGNOSIS — Z95810 Presence of automatic (implantable) cardiac defibrillator: Secondary | ICD-10-CM | POA: Diagnosis not present

## 2020-01-27 DIAGNOSIS — J90 Pleural effusion, not elsewhere classified: Secondary | ICD-10-CM

## 2020-01-27 DIAGNOSIS — J811 Chronic pulmonary edema: Secondary | ICD-10-CM

## 2020-01-27 DIAGNOSIS — J189 Pneumonia, unspecified organism: Secondary | ICD-10-CM

## 2020-01-27 DIAGNOSIS — I959 Hypotension, unspecified: Secondary | ICD-10-CM

## 2020-01-27 DIAGNOSIS — A419 Sepsis, unspecified organism: Secondary | ICD-10-CM | POA: Diagnosis not present

## 2020-01-27 DIAGNOSIS — R55 Syncope and collapse: Secondary | ICD-10-CM

## 2020-01-27 DIAGNOSIS — I472 Ventricular tachycardia: Secondary | ICD-10-CM

## 2020-01-27 DIAGNOSIS — R6520 Severe sepsis without septic shock: Secondary | ICD-10-CM

## 2020-01-27 DIAGNOSIS — I5043 Acute on chronic combined systolic (congestive) and diastolic (congestive) heart failure: Secondary | ICD-10-CM

## 2020-01-27 DIAGNOSIS — J9601 Acute respiratory failure with hypoxia: Secondary | ICD-10-CM

## 2020-01-27 DIAGNOSIS — Z9861 Coronary angioplasty status: Secondary | ICD-10-CM

## 2020-01-27 DIAGNOSIS — Z951 Presence of aortocoronary bypass graft: Secondary | ICD-10-CM

## 2020-01-27 DIAGNOSIS — I361 Nonrheumatic tricuspid (valve) insufficiency: Secondary | ICD-10-CM

## 2020-01-27 LAB — CBC
HCT: 23.3 % — ABNORMAL LOW (ref 39.0–52.0)
Hemoglobin: 7.1 g/dL — ABNORMAL LOW (ref 13.0–17.0)
MCH: 27.1 pg (ref 26.0–34.0)
MCHC: 30.5 g/dL (ref 30.0–36.0)
MCV: 88.9 fL (ref 80.0–100.0)
Platelets: 197 10*3/uL (ref 150–400)
RBC: 2.62 MIL/uL — ABNORMAL LOW (ref 4.22–5.81)
RDW: 19.2 % — ABNORMAL HIGH (ref 11.5–15.5)
WBC: 6.1 10*3/uL (ref 4.0–10.5)
nRBC: 0 % (ref 0.0–0.2)

## 2020-01-27 LAB — COMPREHENSIVE METABOLIC PANEL
ALT: 13 U/L (ref 0–44)
AST: 24 U/L (ref 15–41)
Albumin: 2.2 g/dL — ABNORMAL LOW (ref 3.5–5.0)
Alkaline Phosphatase: 101 U/L (ref 38–126)
Anion gap: 7 (ref 5–15)
BUN: 18 mg/dL (ref 8–23)
CO2: 27 mmol/L (ref 22–32)
Calcium: 8.2 mg/dL — ABNORMAL LOW (ref 8.9–10.3)
Chloride: 105 mmol/L (ref 98–111)
Creatinine, Ser: 1.26 mg/dL — ABNORMAL HIGH (ref 0.61–1.24)
GFR, Estimated: 57 mL/min — ABNORMAL LOW (ref >60)
Glucose, Bld: 117 mg/dL — ABNORMAL HIGH (ref 70–99)
Potassium: 3.9 mmol/L (ref 3.5–5.1)
Sodium: 139 mmol/L (ref 135–145)
Total Bilirubin: 0.8 mg/dL (ref 0.3–1.2)
Total Protein: 5.2 g/dL — ABNORMAL LOW (ref 6.5–8.1)

## 2020-01-27 LAB — LACTIC ACID, PLASMA
Lactic Acid, Venous: 1.3 mmol/L (ref 0.5–1.9)
Lactic Acid, Venous: 1.2 mmol/L (ref 0.5–1.9)

## 2020-01-27 LAB — PREPARE RBC (CROSSMATCH)

## 2020-01-27 LAB — HEMOGLOBIN AND HEMATOCRIT, BLOOD
HCT: 21.8 % — ABNORMAL LOW (ref 39.0–52.0)
Hemoglobin: 6.7 g/dL — ABNORMAL LOW (ref 13.0–17.0)

## 2020-01-27 LAB — MAGNESIUM: Magnesium: 1.9 mg/dL (ref 1.7–2.4)

## 2020-01-27 LAB — HEMOGLOBIN A1C
Hgb A1c MFr Bld: 4.8 % (ref 4.8–5.6)
Mean Plasma Glucose: 91.06 mg/dL

## 2020-01-27 LAB — TROPONIN I (HIGH SENSITIVITY)
Troponin I (High Sensitivity): 184 ng/L (ref <18)
Troponin I (High Sensitivity): 118 ng/L (ref <18)

## 2020-01-27 MED ORDER — MIDODRINE HCL 5 MG PO TABS
10.0000 mg | ORAL_TABLET | Freq: Three times a day (TID) | ORAL | Status: DC
  Administered 2020-01-28 – 2020-01-31 (×10): 10 mg via ORAL
  Filled 2020-01-27 (×10): qty 2

## 2020-01-27 MED ORDER — FUROSEMIDE 10 MG/ML IJ SOLN
40.0000 mg | Freq: Every day | INTRAMUSCULAR | Status: DC
  Administered 2020-01-28: 40 mg via INTRAVENOUS
  Filled 2020-01-27: qty 4

## 2020-01-27 MED ORDER — SODIUM CHLORIDE 0.9% IV SOLUTION: Freq: Once | INTRAVENOUS | Status: AC

## 2020-01-27 MED ORDER — SODIUM CHLORIDE 0.9 % IV SOLN
2.0000 g | INTRAVENOUS | Status: AC
  Administered 2020-01-27 – 2020-01-30 (×4): 2 g via INTRAVENOUS
  Filled 2020-01-27: qty 20
  Filled 2020-01-27: qty 2
  Filled 2020-01-27: qty 20
  Filled 2020-01-27: qty 2

## 2020-01-27 NOTE — Evaluation (Signed)
Clinical/Bedside Swallow Evaluation Patient Details  Name: Thomas [name] MRN: [id number] Date of Birth: [birth year]  Today's Date: 01/27/2020 Time: SLP Start Time (ACUTE ONLY): 1315 SLP Stop Time (ACUTE ONLY): 1405 SLP Time Calculation (min) (ACUTE ONLY): 50 min  Past Medical History:  Past Medical History:  Diagnosis Date  . AICD (automatic cardioverter/defibrillator) present   . Bacteremia due to group B Streptococcus    Recurrent admissions for group B Strepotococcus bacteremia of unknown source with TEEs negative for vegetation 06/2019 and 08/2019  . Biventricular ICD (implantable cardioverter-defibrillator) in place 03/24/2005   Implantation of a Medtronic Adapta ADDRO1, serial number [id number]  . CHF (congestive heart failure) ([place])   . CKD (chronic kidney disease), stage III ([place])   . Coronary artery disease    a. s/p CABG 1986. b. Multiple PCIs/caths. c. 09/2013: s/p PTCA and BMS to SVG-OM.  [name]    Requires sign language interpreter  . Dysrhythmia   . History of abdominal aortic aneurysm   . History of bleeding peptic ulcer 1980  . History of epididymitis 2013  . HTN (hypertension)   . Hydronephrosis with ureteropelvic junction obstruction   . Hydroureter on left 2009  . Hypertension   . Ischemic cardiomyopathy    a. Prior EF 30-35%, s/p BIV-ICD. b. 09/2013: EF 45-50%.  . Moderate tricuspid regurgitation   . PAF (paroxysmal atrial fibrillation) (HCC)    Not on [place] 2/2 GIB  . Presence of permanent cardiac pacemaker 2002   Original placed in 2002 for CHB then 2007 and 2014 device change out  . Prostate cancer ([place])   . Status post coronary artery bypass grafting 1986   LIMA to the LAD, SVG to OM, SVG to RCA  . Testicular swelling    Past Surgical History:  Past Surgical History:  Procedure Laterality Date  . 2-D echocardiogram  11/20/2011   Ejection fraction 30-35% moderate concentric left ventricular hypertrophy. Left atrium is moderately dilated. Mild MR. Mild  or  . BI-VENTRICULAR IMPLANTABLE CARDIOVERTER DEFIBRILLATOR N/A 12/16/2012   Procedure: BI-VENTRICULAR IMPLANTABLE CARDIOVERTER DEFIBRILLATOR  (CRT-D);  Surgeon: [name], MD;  Location: [facility] CATH LAB;  Service: Cardiovascular;  Laterality: N/A;  . BIV ICD INSERTION CRT-D N/A 09/01/2019   Procedure: BIV ICD INSERTION CRT-D;  Surgeon: [name], MD;  Location: [place] CV LAB;  Service: Cardiovascular;  Laterality: N/A;  . CARDIAC CATHETERIZATION  12/10/2011   SVG to OM widely patent.  LIMA to LAD patent  . CATARACT EXTRACTION W/PHACO Right 10/12/2017   Procedure: CATARACT EXTRACTION PHACO AND INTRAOCULAR LENS PLACEMENT (IOC);  Surgeon: Birder Robson, MD;  Location: [facility] ORS;  Service: Ophthalmology;  Laterality: Right;  [place] 00:57 AP% 15.9 CDE 9.07 Fluid pack lot # [id number]  . COLONOSCOPY N/A 07/13/2018   Procedure: COLONOSCOPY;  Surgeon: Toledo, Benay Pike, MD;  Location: [facility] ENDOSCOPY;  Service: Gastroenterology;  Laterality: N/A;  . CORONARY ARTERY BYPASS GRAFT  1986  . EMBOLIZATION N/A 01/18/2020   Procedure: EMBOLIZATION;  Surgeon: Algernon Huxley, MD;  Location: [place] CV LAB;  Service: Cardiovascular;  Laterality: N/A;  . ENTEROSCOPY N/A 09/14/2018   Procedure: ENTEROSCOPY;  Surgeon: Toledo, Benay Pike, MD;  Location: [facility] ENDOSCOPY;  Service: Gastroenterology;  Laterality: N/A;  symptomatic anemia, GI blood loss anemia, melena, positive small bowel capsule endoscopy showing source of bleeding   . ENTEROSCOPY N/A 08/22/2019   Procedure: ENTEROSCOPY;  Surgeon: Lin Landsman, MD;  Location: [facility] ENDOSCOPY;  Service: Gastroenterology;  Laterality: N/A;  .  ESOPHAGOGASTRODUODENOSCOPY N/A 07/13/2018   Procedure: ESOPHAGOGASTRODUODENOSCOPY (EGD);  Surgeon: Toledo, Benay Pike, MD;  Location: [facility] ENDOSCOPY;  Service: Gastroenterology;  Laterality: N/A;  . ESOPHAGOGASTRODUODENOSCOPY N/A 09/14/2018   Procedure: ESOPHAGOGASTRODUODENOSCOPY (EGD);  Surgeon: Toledo, Benay Pike, MD;   Location: [facility] ENDOSCOPY;  Service: Gastroenterology;  Laterality: N/A;  SIGN LANAGUAGE INTERPRETER  . ESOPHAGOGASTRODUODENOSCOPY N/A 06/21/2019   Procedure: ESOPHAGOGASTRODUODENOSCOPY (EGD);  Surgeon: Toledo, Benay Pike, MD;  Location: [facility] ENDOSCOPY;  Service: Gastroenterology;  Laterality: N/A;  . ESOPHAGOGASTRODUODENOSCOPY N/A 01/15/2020   Procedure: ESOPHAGOGASTRODUODENOSCOPY (EGD);  Surgeon: Lucilla Lame, MD;  Location: [facility] ENDOSCOPY;  Service: Endoscopy;  Laterality: N/A;  . ESOPHAGOGASTRODUODENOSCOPY (EGD) WITH PROPOFOL N/A 05/27/2018   Procedure: ESOPHAGOGASTRODUODENOSCOPY (EGD) WITH PROPOFOL;  Surgeon: Clarene Essex, MD;  Location: [place];  Service: Endoscopy;  Laterality: N/A;  . ICD LEAD REMOVAL Left 08/25/2019   Procedure: ICD LEAD EXTRACTION;  Surgeon: [name], MD;  Location: [place];  Service: Cardiovascular;  Laterality: Left;  DR. Roxan Hockey BACK UP  . INSERT / REPLACE / REMOVE PACEMAKER    . LEFT HEART CATHETERIZATION WITH CORONARY/GRAFT ANGIOGRAM N/A 12/10/2011   Procedure: LEFT HEART CATHETERIZATION WITH Beatrix Fetters;  Surgeon: Sanda Klein, MD;  Location: [place] CATH LAB;  Service: Cardiovascular;  Laterality: N/A;  . LEFT HEART CATHETERIZATION WITH CORONARY/GRAFT ANGIOGRAM N/A 09/25/2013   Procedure: LEFT HEART CATHETERIZATION WITH Beatrix Fetters;  Surgeon: Blane Ohara, MD;  Location: [facility] C[facility] LAB;  Service: Cardiovascular;  Laterality: N/A;  . MULTIPLE EXTRACTIONS WITH ALVEOLOPLASTY Bilateral 08/30/2019   Procedure: Extractionof tooth #'s 2, 28, and 31 with alveoloplasty and gross debridement of remaining teeth.;  Surgeon: [name], DDS;  Location: [place];  Service: Oral Surgery;  Laterality: Bilateral;  . Persantine Myoview  05/06/2010   Post-rest ejection fraction 30%. No significant ischemia demonstrated. Compared to previous study there is no significant change.  . TEE WITHOUT CARDIOVERSION N/A 06/22/2019   Procedure: TRANSESOPHAGEAL  ECHOCARDIOGRAM (TEE);  Surgeon: Minna Merritts, MD;  Location: [facility] ORS;  Service: Cardiovascular;  Laterality: N/A;  . TEE WITHOUT CARDIOVERSION N/A 08/23/2019   Procedure: TRANSESOPHAGEAL ECHOCARDIOGRAM (TEE);  Surgeon: Kate Sable, MD;  Location: [facility] ORS;  Service: Cardiovascular;  Laterality: N/A;  . TEE WITHOUT CARDIOVERSION N/A 08/25/2019   Procedure: TRANSESOPHAGEAL ECHOCARDIOGRAM (TEE);  Surgeon: [name], MD;  Location: [facility] OR;  Service: Cardiovascular;  Laterality: N/A;  . TRANSURETHRAL RESECTION OF PROSTATE     s/p   HPI:  Pt is a 85 y.o. male legally deaf communicates with sign language, with Past medical history of GERD, CAD s/p CABG, s/p multiple PCI, A. Fib not on AC due to h/o GIBleed, second-degree AV block s/p biventricular AICD, chronic systolic CHF LVEF 35 to 32%, tricuspid valve regurgitation, history of CVA without residual weakness, h/o GI bleed duodenal AVM, prostate cancer treated more than 20 years ago, as reviewed from EMR, presented at [facility] ED from home after being found down between the toilet and the bathtub in his bathroom with shallow breathing. HPI reviewed from ER physician chart and patient's son was at bedside who helped to find out the actual problem. As per them patient was having difficulty breathing in the morning and then he thought to take a shower after an hour he was found down in the bathroom. EMS was called and patient was found to have acute hyper respiratory failure O2 saturation 47% on room air, patient was placed on CPAP which he tolerated and O2 sat improved to 91%. On arrival in  the ED patient was on 15 L nonrebreather and with mild respiratory distress and patient was placed on BiPAP.  Pt is currently on 3L O2 via [place].  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  Per chart note also, He has dysphagia with certain solid foods and Pills. EGD from 2020 showed hiatal hernia.  Pt denies any difficulty swallowing.  Exhibits a chronic wet cough at rest and w/ any exertion in bed PRIOR to po's.  Pt stated Cold liquids cause him to cough.    Assessment / Plan / Recommendation Clinical Impression  Pt seen during this bedside swallow evaluation using Interpreter services(ASL). Pt presented w/ a chronic, mildy congested cough PRIOR to any po's w/ just exertion in bed. Instructed pt on breathing calmly to avoid WOB/SOB which could interfere w/ swallowing. Also instructed on sitting fully upright w/ all oral intake.  Pt appears to present w/ adequate oropharyngeal phase swallow w/ No overt oropharyngeal phase dysphagia noted, No neuromuscular deficits noted. Pt consumed po trials w/ No immediate, overt clinical s/s of aspiration during po trials accepted. Pt appears at reduced risk for aspiration following general aspiration precautions, and Reflux precautions. Pt has endorse Baseline Esophageal Dysmotility and discomfort w/ Regurgitation occurrences -- dx'd Hiatal Hernia per one EGD note in 2020 in chart. The dysmotility can happen w/ Pills or solid foods he communicated. Any Esophageal dysmotility can impact the pharyngeal phase of swallowing as well as impact overall desire for oral intake and increase risk for aspiration of Regurgitated food/liquid material thus pulmonary impact.  During po trials at BSE, pt consumed all consistencies w/ no immediate, overt coughing, decline in vocal quality, or change in respiratory presentation during/post trials. O2 sats remained 97%.  Oral phase appeared [facility] w/ timely bolus management, mastication, and control of bolus  propulsion for A-P transfer for swallowing. Oral clearing achieved w/ all trial consistencies. OM Exam appeared [facility] w/ no unilateral weakness noted. Pt fed self w/ setup support.  Recommend a more Mech Soft consistency diet w/ well-Cut or Minced meats, moistened foods as recommended previously during assessment in 08/2019; Thin liquids. Recommend general aspiration and Reflux precautions, Pills WHOLE in Puree for safer, easier swallowing in light of Esophageal dysmotility complaints. Education given on Pills in Puree; food consistencies and easy to eat options; general aspiration and Reflux precautions. NSG to reconsult if any new needs arise. MD/NSG/pt agreed. Precautions posted. SLP Visit Diagnosis: Dysphagia, unspecified (R13.10)    Aspiration Risk   (reduced following general precautions)    Diet Recommendation  Mech Soft diet w/ thin liquids; general aspiration precautions -- must take small, slow sips to lessen any increased WOB/SOB which could interfere w/ swallowing. Rest Breaks during meals. REFLUX/GERD precautions.   Medication Administration: Whole meds with puree (as needed)    Other  Recommendations Recommended Consults:  (dietician f/u) Oral Care Recommendations: Oral care BID;Oral care before and after PO;Patient independent with oral care Other Recommendations:  (n/a)   Follow up Recommendations None      Frequency and Duration  (n/a)   (n/a)       Prognosis Prognosis for Safe Diet Advancement: Good Barriers to Reach  Goals: Severity of deficits;Time post onset (weakness)      Swallow Study   General Date of Onset: 01/26/20 HPI: Pt is a 85 y.o. male legally deaf communicates with sign language, with Past medical history of GERD, CAD s/p CABG, s/p multiple PCI, A. Fib not on AC due to h/o GIBleed, second-degree AV block s/p biventricular AICD, chronic systolic CHF LVEF 35 to 64%, tricuspid valve regurgitation, history of CVA without residual weakness, h/o GI bleed duodenal  AVM, prostate cancer treated more than 20 years ago, as reviewed from EMR, presented at [facility] ED from home after being found down between the toilet and the bathtub in his bathroom with shallow breathing. HPI reviewed from ER physician chart and patient's son was at bedside who helped to find out the actual problem. As per them patient was having difficulty breathing in the morning and then he thought to take a shower after an hour he was found down in the bathroom. EMS was called and patient was found to have acute hyper respiratory failure O2 saturation 47% on room air, patient was placed on CPAP which he tolerated and O2 sat improved to 91%. On arrival in the ED patient was on 15 L nonrebreather and with mild respiratory distress and patient was placed on BiPAP.  Pt is currently on 3L O2 via [place].  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  Per chart note also, He has dysphagia with certain solid foods and Pills. EGD from 2020 showed hiatal hernia.  Pt denies any difficulty swallowing.  Exhibits a chronic wet cough at rest and w/ any exertion in bed PRIOR to po's.  Pt stated Cold liquids cause him to cough.  Type of Study: Bedside Swallow Evaluation Previous Swallow Assessment: 2021; 2017 - mech soft diet w/ thin liquids rec'd Diet Prior to this Study: Dysphagia 3 (soft);Thin liquids (in 08/2019) Temperature Spikes Noted: No (wbc 6.1) Respiratory Status: Nasal cannula (3L) History of Recent Intubation: No Behavior/Cognition: Alert;Cooperative;Pleasant mood (ASL) Oral Cavity Assessment: Within Functional  Limits Oral Care Completed by SLP: Recent completion by staff Oral Cavity - Dentition: Missing dentition (few) Vision: Functional for self-feeding Self-Feeding Abilities: Able to feed self;Needs set up Patient Positioning: Upright in bed (needed min encouragement to sit fully upright) Baseline Vocal Quality:  (uses ASL) Volitional Cough: Strong;Congested Volitional Swallow: Able to elicit    Oral/Motor/Sensory Function Overall Oral Motor/Sensory Function: Within functional limits   Ice Chips Ice chips: Not tested   Thin Liquid Thin Liquid: Within functional limits Presentation: Cup;Self Fed;Straw (1 trial via cup; 8 trials via straw(preferred)) Other Comments: pt kept saying via ASL - "no problems"    Nectar Thick Nectar Thick Liquid: Not tested   Honey Thick Honey Thick Liquid: Not tested   Puree Puree: Within functional limits Presentation: Self Fed;Spoon (5 trials)   Solid     Solid: Within functional limits (soft solid moistened) Presentation: Self Fed (3 trials) Other Comments: reluctant to eat more - did not want it       Orinda Kenner, [place], [employer] Speech Language Pathologist Rehab Services [phone] Priscille Shadduck 01/27/2020,2:38 PM

## 2020-01-27 NOTE — Plan of Care (Signed)
  Problem: Education: Goal: Knowledge of General Education information will improve Description: Including pain rating scale, medication(s)/side effects and non-pharmacologic comfort measures Outcome: Progressing   Problem: Health Behavior/Discharge Planning: Goal: Ability to manage health-related needs will improve Outcome: Progressing   Problem: Clinical Measurements: Goal: Ability to maintain clinical measurements within normal limits will improve Outcome: Progressing Goal: Will remain free from infection Outcome: Progressing Goal: Diagnostic test results will improve Outcome: Progressing Goal: Respiratory complications will improve Outcome: Progressing Goal: Cardiovascular complication will be avoided Outcome: Progressing   Problem: Activity: Goal: Risk for activity intolerance will decrease Outcome: Progressing   Problem: Nutrition: Goal: Adequate nutrition will be maintained Outcome: Progressing   Problem: Coping: Goal: Level of anxiety will decrease Outcome: Progressing   Problem: Elimination: Goal: Will not experience complications related to bowel motility Outcome: Progressing Goal: Will not experience complications related to urinary retention Outcome: Progressing   Problem: Pain Managment: Goal: General experience of comfort will improve Outcome: Progressing   Problem: Safety: Goal: Ability to remain free from injury will improve Outcome: Progressing   Problem: Skin Integrity: Goal: Risk for impaired skin integrity will decrease Outcome: Progressing   Problem: Activity: Goal: Ability to tolerate increased activity will improve Outcome: Progressing   Problem: Clinical Measurements: Goal: Ability to maintain a body temperature in the normal range will improve Outcome: Progressing   Problem: Respiratory: Goal: Ability to maintain adequate ventilation will improve Outcome: Progressing Goal: Ability to maintain a clear airway will improve Outcome:  Progressing    Patient receiving 1 units of blood, no transfusion reaction noted at this time. Staff continues to frequently check on patient.

## 2020-01-27 NOTE — Evaluation (Signed)
Physical Therapy Evaluation Patient Details Name: Thomas [name] MRN: [id number] DOB: 09-11-1934 Today's Date: 01/27/2020   History of Present Illness  Thomas [name] is an 68yoM who comes to [facility] on 11/12 after fall in BR at home, found on floor by wife. PMH: hearing impairment uses ASL, CAD, high-grade AVB s/p BiV-ICD, sCHF c recovered EF, CKD-3A, A. fib not on AC due to GI bleed/duodenal AVM, lymphedema, PUD, PrCA.  He had right-sided BiV-ICD insertion CRT-D on 6/18. Pt recently here and DC on 11/7 back to home c homehealth services. CTA chest negative for acute PE, but does reveal 5cm TAA, increased from 4.8cm on 05/19/19 imaging study.  Clinical Impression  Pt admitted with above diagnosis. Pt currently with functional limitations due to the deficits listed below (see "PT Problem List"). Upon entry, pt in bed, awake and agreeable to participate. The pt is alert and oriented x4, pleasant, conversational, and generally a good historian. ModI bed mobility and transfers performed on Room air, sats at 94%, Pt uses intermittent UE support for modI balance, no gross LOB in session. AMB deferred as tele interpreter stand is broke and requires constant hands-on fo ruse. Pt reports to be at baseline with prolonged standing and supported marching in place. Functional mobility assessment demonstrates increased effort/time requirements, poor tolerance, and need for physical assistance, whereas the patient performed these at a higher level of independence PTA.    Empirically, the patient demonstrates increased risk of recurrent falls AEB gait speed <0.76m/s, forward reach <5", and multiple LOB demonstrated throughout session. Pt will benefit from skilled PT intervention to increase independence and safety with basic mobility in preparation for discharge to the venue listed below.       Follow Up Recommendations Home health PT;Supervision - Intermittent    Equipment Recommendations  None recommended by PT     Recommendations for Other Services       Precautions / Restrictions Precautions Precautions: Fall Restrictions Weight Bearing Restrictions: No      Mobility  Bed Mobility Overal bed mobility: Modified Independent                  Transfers Overall transfer level: Modified independent Equipment used: None             General transfer comment: no presyncope prodrome  Ambulation/Gait Ambulation/Gait assistance:  (deferred due to broken tele interpreter. Pt able to march in place with counter support, reports to feelat basleine, Room air, 94%)              Stairs            Wheelchair Mobility    Modified Rankin (Stroke Patients Only)       Balance                                             Pertinent Vitals/Pain Pain Assessment: No/denies pain    Home Living Family/patient expects to be discharged to:: Private residence Living Arrangements: Spouse/significant other Available Help at Discharge: Family;Available 24 hours/day (Wife/Son)) Type of Home: House Home Access: Level entry     Home Layout: One level Home Equipment: Walker - 2 wheels;Cane - single point;Other (comment) Additional Comments: Went home with O2 last admission (last week) but was not using    Prior Function Level of Independence: Independent with assistive device(s)  Comments: SPC in the home, has had multiple recent falls, reports he cooks, does his own bathing, dresses himself, etc, doesn't walk long distance     Hand Dominance        Extremity/Trunk Assessment                Communication   Communication: Deaf (ASL)  Cognition Arousal/Alertness: Awake/alert Behavior During Therapy: WFL for tasks assessed/performed Overall Cognitive Status: Within Functional Limits for tasks assessed                                        General Comments      Exercises     Assessment/Plan    PT Assessment  Patient needs continued PT services  PT Problem List Decreased strength;Decreased activity tolerance;Decreased balance;Decreased knowledge of use of DME;Decreased safety awareness;Pain;Decreased mobility       PT Treatment Interventions DME instruction;Gait training;Functional mobility training;Therapeutic activities;Therapeutic exercise;Balance training;Neuromuscular re-education;Patient/family education    PT Goals (Current goals can be found in the Care Plan section)  Acute Rehab PT Goals Patient Stated Goal: Go home PT Goal Formulation: With patient Time For Goal Achievement: 02/10/20 Potential to Achieve Goals: Good    Frequency Min 2X/week   Barriers to discharge        Co-evaluation               AM-PAC PT "6 Clicks" Mobility  Outcome Measure Help needed turning from your back to your side while in a flat bed without using bedrails?: None Help needed moving from lying on your back to sitting on the side of a flat bed without using bedrails?: None Help needed moving to and from a bed to a chair (including a wheelchair)?: A Little Help needed standing up from a chair using your arms (e.g., wheelchair or bedside chair)?: None Help needed to walk in hospital room?: A Little Help needed climbing 3-5 steps with a railing? : A Little 6 Click Score: 21    End of Session   Activity Tolerance: Patient tolerated treatment well;No increased pain Patient left: with call bell/phone within reach;with bed alarm set;in bed Nurse Communication: Mobility status PT Visit Diagnosis: Muscle weakness (generalized) (M62.81);Difficulty in walking, not elsewhere classified (R26.2);Unsteadiness on feet (R26.81)    Time: 9983-3825 PT Time Calculation (min) (ACUTE ONLY): 24 min   Charges:   PT Evaluation $PT Eval Moderate Complexity: 1 Mod          6:22 PM, 01/27/20 Etta Grandchild, PT, DPT Physical Therapist - [facility]  [phone] ([place])     Laurin Paulo C 01/27/2020, 6:20 PM

## 2020-01-27 NOTE — Progress Notes (Signed)
PROGRESS NOTE  Thomas [name] HKV:[id number] DOB: [birth year]   PCP: [name], MD  Patient is from: home.  Lives with his wife.  Uses walker at baseline.  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 fib not on AC due to GI bleed, systolic CHF/second-degree AVB s/p BiV AICD, CVA without residual deficit, GIB with duodenal AVM and treated prostate cancer brought to ED by EMS after syncopal episode at home.  Reportedly started feeling short of breath in the morning. It seems he went to take shower and found down between the toilet commode and the bus stop.  Patient denies hitting his head or LOC.  When EMS arrived, hypoxic to 47% on RA, and recovered to 91% on CPAP.  In ED, slightly tachycardic to 100s.  Tachypneic to 30s.  BP 118/57.  95% on BiPAP.  WBC 5.4.  Hgb 8.6 (higher than baseline).  ABG normal.  BNP 266 (lower than baseline).  Troponin 35> 83.  COVID-19, influenza and RSV PCR negative.  UA without significant finding.  EKG paced rhythm.  CXR concerning for CHF.  Procalcitonin elevated.  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   Admitted for acute respiratory failure, sepsis, pneumonia and possible CHF. Started on ceftriaxone, azithromycin and IV Lasix.  Cardiology consulted for elevated troponin.   Subjective: Seen and examined earlier this morning with the help of sign interpreter with ID number [id number].  Feels better.  He reports chills, productive cough with yellowish phlegm and blood tinge.  Breathing improved.  Denies chest pain, GI or UTI symptoms.  Objective: Vitals:   01/26/20 2156 01/27/20 0000 01/27/20 0500 01/27/20 0758    BP: (!) 97/52 (!) 96/54 (!) 94/59 105/60  Pulse: 70  70 70  Resp: 18 20    Temp: 98.6 F (37 C) 97.7 F (36.5 C) (!) 97.5 F (36.4 C) 98.7 F (37.1 C)  TempSrc: Oral Axillary Oral Oral  SpO2: 98%  100% 94%  Weight:   80.4 kg   Height:        Intake/Output Summary (Last 24 hours) at 01/27/2020 1149 Last data filed at 01/27/2020 0100 Gross per 24 hour  Intake 831.32 ml  Output 380 ml  Net 451.32 ml   Filed Weights   01/26/20 0944 01/27/20 0500  Weight: 79.4 kg 80.4 kg    Examination:  GENERAL: No apparent distress.  Nontoxic. HEENT: MMM.  Vision grossly intact. NECK: Supple.  Notable JVD. RESP: 100% on 3 L.  No IWOB.  Bilateral crackles. CVS:  RRR. Heart sounds normal.  ABD/GI/GU: BS+. Abd soft, NTND.  MSK/EXT:  Moves extremities. No apparent deformity. No edema.  SKIN: no apparent skin lesion or wound NEURO: Awake, alert and oriented appropriately.  No apparent focal neuro deficit. PSYCH: Calm. Normal affect.   Procedures:  None  Microbiology summarized: [id number], influenza and RSV PCR nonreactive.  Assessment & Plan: Acute hypoxemic respiratory failure-multifactorial including sepsis, community-acquired pneumonia, pleural effusion, CHF exacerbation and anemia.  Reportedly desaturated to 47% when EMS arrived.  Started on BiPAP.  Currently on 3 L by nasal cannula. -Wean oxygen as able -Treat treatable causes as below.  Severe sepsis due to community-acquired pneumonia-POA.  Concerned about aspiration pneumonia.  Met criteria  for severe sepsis with tachycardia, pneumonia and respiratory failure with hypoxia requiring BiPAP.  Procalcitonin elevated.  Blood culture pending but Gram stain with GPC and GPR.  Sepsis physiology resolving. -Continue ceftriaxone and azithromycin -SLP eval -Aspiration precautions -Incentive spirometry and flutter valve -Mucolytic's and antitussives. -Follow blood cultures.  Acute on chronic systolic CHF with recovered EF s/p CRT-D in  08/2019: TEE on 08/2019 with EF of 55 to 60% (previously 35 to 40%), moderate to severe TVR.  Has dyspnea and evidence of pulmonary congestion on CXR and CT but BNP lower than baseline.  Reports good urine output although not well captured.  Has JVD and crackles on exam.  Creatinine slightly up. -Reduce IV Lasix to 40 mg daily. -Hold home Aldactone and losartan. -Monitor fluid status, renal function and electrolytes -Follow repeat TTE.  Moderate bilateral pleural effusion: Likely due to CHF but difficult to exclude parapneumonic effusion.   -Given improvement in his respiratory status, treat with diuretics and recheck chest x-ray. -If no improvement after diuretics, consider thoracocentesis  Syncopal fall: Likely due to severe sepsis and hypoxia.  No head impact or loss of consciousness -Treat sepsis as above. -TTE -Continue telemetry monitoring -PT/OT eval.  Nonsustained V. tach: Patient had 18 beats of V. tach this afternoon.  He felt brief flutters, dizziness and shortness of breath.  Currently asymptomatic. -Cardiology notified by RN.  Chronic hypotension: On midodrine at home. -Continue home midodrine  Elevated troponin: High-sensitivity troponin 35>> 192 (peak)> 184.  Likely demand ischemia in the setting of hypoxia and sepsis.[name]  EKG without acute ischemic finding.  CTA negative for PE.  Patient without chest pain.  -Appreciate input by cardiology -Follow TTE.  History of  CAD s/p CABG and  multiple PCI: Elevated troponin but no chest pain.  EKG without acute ischemic finding. -Continue home medications -Follow TTE  Second-degree AV block s/p AICD Permanent atrial fibrillation: Rate controlled without medication..  Not on AC due to GI bleed.  Anemia of chronic disease and chronic GI bleed: Hgb 8.6 on admit> 7.1.  Baseline 7.0-8.0.  Denies melena or hematochezia. -Check Hemoccult -Stop aspirin and subcu Lovenox -SCD for VTE prophylaxis -Transfuse a unit of blood.  Keep  hemoglobin greater than 8.0.  Legally deaf, communicates with sign language. -Use language iPad   Debility/physical deconditioning: Uses walker at baseline. -PT/OT eval  Body mass index is 25.43 kg/m.       Pressure Injury 06/21/19 Buttocks Mid Stage 2 -  Partial thickness loss of dermis presenting as a shallow open injury with a red, pink wound bed without slough. (Active)  06/21/19 0700  Location: Buttocks  Location Orientation: Mid  Staging: Stage 2 -  Partial thickness loss of dermis presenting as a shallow open injury with a red, pink wound bed without slough.  Wound Description (Comments):   Present on Admission:    DVT prophylaxis:  Place and maintain sequential compression device Start: 01/27/20 1009 SCDs Start: 01/26/20 1757  Code Status: Full code Family Communication: Attempted to call patient's wife for update but no answer. Status is: Inpatient  Remains inpatient appropriate because:Ongoing diagnostic testing needed not appropriate for outpatient work up, IV treatments appropriate due to intensity of illness or inability to take PO and Inpatient level of care appropriate due to severity of illness   Dispo: The patient is from: Home              Anticipated d/c is to: Home  Anticipated d/c date is: 3 days              Patient currently is not medically stable to d/c.       Consultants:  Cardiology   Sch Meds:  Scheduled Meds: . furosemide  40 mg Intravenous BID  . guaiFENesin  600 mg Oral BID  . ipratropium-albuterol  3 mL Nebulization Q6H  . midodrine  5 mg Oral TID WC  . pantoprazole  40 mg Oral BID  . simvastatin  20 mg Oral Daily  . sodium chloride flush  3 mL Intravenous Q12H   Continuous Infusions: . sodium chloride    . azithromycin Stopped (01/26/20 1639)  . cefTRIAXone (ROCEPHIN)  IV     PRN Meds:.sodium chloride, acetaminophen **OR** acetaminophen, bisacodyl, guaiFENesin-dextromethorphan, nitroGLYCERIN, ondansetron  **OR** ondansetron (ZOFRAN) IV, polyethylene glycol, sodium chloride flush  Antimicrobials: Anti-infectives (From admission, onward)   Start     Dose/Rate Route Frequency Ordered Stop   01/27/20 1430  cefTRIAXone (ROCEPHIN) 2 g in sodium chloride 0.9 % 100 mL IVPB        2 g 200 mL/hr over 30 Minutes Intravenous Every 24 hours 01/27/20 0803     01/26/20 1430  cefTRIAXone (ROCEPHIN) 1 g in sodium chloride 0.9 % 100 mL IVPB  Status:  Discontinued        1 g 200 mL/hr over 30 Minutes Intravenous Every 24 hours 01/26/20 1422 01/27/20 0803   01/26/20 1430  azithromycin (ZITHROMAX) 500 mg in sodium chloride 0.9 % 250 mL IVPB        500 mg 250 mL/hr over 60 Minutes Intravenous Every 24 hours 01/26/20 1422         I have personally reviewed the following labs and images: CBC: Recent Labs  Lab 01/21/20 0454 01/26/20 0946 01/27/20 0730  WBC 5.6 5.4 6.1  NEUTROABS  --  4.4  --   HGB 7.8* 8.6* 7.1*  HCT 24.7* 28.6* 23.3*  MCV 87.6 90.2 88.9  PLT 179 305 197   BMP &GFR Recent Labs  Lab 01/21/20 0454 01/26/20 0946 01/27/20 0730  NA 136 140 139  K 4.4 3.7 3.9  CL 109 105 105  CO2 _0 GLUCOSE 98 153* 117*  BUN _1 CREATININE 1.01 1.20 1.26*  CALCIUM 8.0* 8.6* 8.2*   Estimated Creatinine Clearance: 46.7 mL/min (A) (by C-G formula based on SCr of 1.26 mg/dL (H)). Liver & Pancreas: Recent Labs  Lab 01/26/20 0946 01/27/20 0730  AST 35 24  ALT 16 13  ALKPHOS 139* 101  BILITOT 0.9 0.8  PROT 5.6* 5.2*  ALBUMIN 2.6* 2.2*   No results for input(s): LIPASE, AMYLASE in the last 168 hours. No results for input(s): AMMONIA in the last 168 hours. Diabetic: Recent Labs    01/26/20 1840  HGBA1C 4.8   No results for input(s): GLUCAP in the last 168 hours. Cardiac Enzymes: No results for input(s): CKTOTAL, CKMB, CKMBINDEX, TROPONINI in the last 168 hours. No results for input(s): PROBNP in the last 8760 hours. Coagulation Profile: No results for input(s): INR,  PROTIME in the last 168 hours. Thyroid Function Tests: No results for input(s): TSH, T4TOTAL, FREET4, T3FREE, THYROIDAB in the last 72 hours. Lipid Profile: No results for input(s): CHOL, HDL, LDLCALC, TRIG, CHOLHDL, LDLDIRECT in the last 72 hours. Anemia Panel: No results for input(s): VITAMINB12, FOLATE, FERRITIN, TIBC, IRON, RETICCTPCT in the last 72 hours. Urine analysis:    Component Value Date/Time   COLORURINE YELLOW (  A) 01/26/2020 0941   APPEARANCEUR CLEAR (A) 01/26/2020 0941   APPEARANCEUR Clear 12/13/2017 0918   LABSPEC 1.017 01/26/2020 0941   LABSPEC 1.021 11/19/2011 1501   PHURINE 5.0 01/26/2020 0941   GLUCOSEU NEGATIVE 01/26/2020 0941   GLUCOSEU Negative 11/19/2011 1501   HGBUR NEGATIVE 01/26/2020 0941   BILIRUBINUR NEGATIVE 01/26/2020 0941   BILIRUBINUR Negative 12/13/2017 0918   BILIRUBINUR Negative 11/19/2011 1501   KETONESUR NEGATIVE 01/26/2020 0941   PROTEINUR NEGATIVE 01/26/2020 0941   NITRITE NEGATIVE 01/26/2020 0941   LEUKOCYTESUR NEGATIVE 01/26/2020 0941   LEUKOCYTESUR Negative 11/19/2011 1501   Sepsis Labs: Invalid input(s): PROCALCITONIN, [place]  Microbiology: Recent Results (from the past 240 hour(s))  Respiratory Panel by RT PCR (Flu A&B, Covid) - Nasopharyngeal Swab     Status: None   Collection Time: 01/26/20  9:46 AM   Specimen: Nasopharyngeal Swab  Result Value Ref Range Status   SARS Coronavirus 2 by RT PCR NEGATIVE NEGATIVE Final    Comment: (NOTE) SARS-CoV-2 target nucleic acids are NOT DETECTED.  The SARS-CoV-2 RNA is generally detectable in upper respiratoy specimens during the acute phase of infection. The lowest concentration of SARS-CoV-2 viral copies this assay can detect is 131 copies/mL. A negative result does not preclude SARS-Cov-2 infection and should not be used as the sole basis for treatment or other patient management decisions. A negative result may occur with  improper specimen collection/handling, submission of  specimen other than nasopharyngeal swab, presence of viral mutation(s) within the areas targeted by this assay, and inadequate number of viral copies (<131 copies/mL). A negative result must be combined with clinical observations, patient history, and epidemiological information. The expected result is Negative.  Fact Sheet for Patients:  PinkCheek.be  Fact Sheet for Healthcare Providers:  GravelBags.it  This test is no t yet approved or cleared by the [place] FDA and  has been authorized for detection and/or diagnosis of SARS-CoV-2 by FDA under an Emergency Use Authorization (EUA). This EUA will remain  in effect (meaning this test can be used) for the duration of the COVID-19 declaration under Section 564(b)(1) of the Act, 21 U.S.C. section 360bbb-3(b)(1), unless the authorization is terminated or revoked sooner.     Influenza A by PCR NEGATIVE NEGATIVE Final   Influenza B by PCR NEGATIVE NEGATIVE Final    Comment: (NOTE) The Xpert Xpress SARS-CoV-2/FLU/RSV assay is intended as an aid in  the diagnosis of influenza from Nasopharyngeal swab specimens and  should not be used as a sole basis for treatment. Nasal washings and  aspirates are unacceptable for Xpert Xpress SARS-CoV-2/FLU/RSV  testing.  Fact Sheet for Patients: PinkCheek.be  Fact Sheet for Healthcare Providers: GravelBags.it  This test is not yet approved or cleared by the [place] FDA and  has been authorized for detection and/or diagnosis of SARS-CoV-2 by  FDA under an Emergency Use Authorization (EUA). This EUA will remain  in effect (meaning this test can be used) for the duration of the  Covid-19 declaration under Section 564(b)(1) of the Act, 21  U.S.C. section 360bbb-3(b)(1), unless the authorization is  terminated or revoked. Performed at [facility], [address] [place], [place] [address]   Expectorated sputum assessment w rflx to resp cult     Status: None   Collection Time: 01/26/20  2:20 PM   Specimen: Sputum  Result Value Ref Range Status   Specimen Description SPUTUM  Final   Special Requests NONE  Final   Sputum evaluation   Final  THIS SPECIMEN IS ACCEPTABLE FOR SPUTUM CULTURE Performed at [facility], [address] [place], [place] [address]    Report Status 01/26/2020 FINAL  Final  Culture, respiratory     Status: None (Preliminary result)   Collection Time: 01/26/20  2:20 PM   Specimen: SPU  Result Value Ref Range Status   Specimen Description   Final    SPUTUM Performed at [facility], [address] [place], [place] [address]    Special Requests   Final    NONE Reflexed from [id number] Performed at [facility], [address] [place], [place] [address]    Gram Stain   Final    ABUNDANT WBC PRESENT, PREDOMINANTLY PMN MODERATE GRAM POSITIVE COCCI RARE GRAM POSITIVE RODS    Culture   Final    CULTURE REINCUBATED FOR BETTER GROWTH Performed at [facility], [place] [address], [place], [place] [address]    Report Status PENDING  Incomplete    Radiology Studies: CT Angio Chest PE W/Cm &/Or Wo Cm  Result Date: 01/26/2020 CLINICAL DATA:  Hypoxia EXAM: CT ANGIOGRAPHY CHEST WITH CONTRAST TECHNIQUE: Multidetector CT imaging of the chest was performed using the standard protocol during bolus administration of intravenous contrast. Multiplanar CT image reconstructions and MIPs were obtained to evaluate the vascular anatomy. CONTRAST:  66m OMNIPAQUE IOHEXOL 350 MG/ML SOLN COMPARISON:  CT 05/19/2019 FINDINGS: Cardiovascular: Satisfactory opacification of the pulmonary arteries. No evidence of filling defect to the lobar branch level. Evaluation is somewhat limited by respiratory motion artifact and extensive streak and beam hardening artifact related to patient's pacemaker. Mid ascending thoracic  aorta measures 5.0 cm in diameter (previously 4.8 cm on 05/19/2019). There is tortuosity of the descending thoracic aorta. Atherosclerotic calcification of the aorta and coronary arteries. Moderate four-chamber cardiomegaly. Status post CABG. Mediastinum/Nodes: There are a few mildly prominent mediastinal lymph nodes including index upper right paratracheal node measuring 12 mm (series 6, image 25), previously measured 9 mm. No axillary or hilar lymphadenopathy. Trachea, esophagus, and thyroid gland appear within normal limits. Lungs/Pleura: Moderate bilateral pleural effusions with associated compressive atelectasis. Patchy airspace opacities within the dependent portions of the bilateral lower lobes, right middle lobe, and lingula suspicious for superimposed pneumonia or aspiration. No pneumothorax. Upper Abdomen: Reflux of contrast into the IVC and hepatic veins. Left hepatic lobe cyst. Musculoskeletal: No chest wall abnormality. No acute or significant osseous findings. Review of the MIP images confirms the above findings. IMPRESSION: 1. Negative for pulmonary embolism to the lobar branch level. 2. Moderate bilateral pleural effusions with associated compressive atelectasis. 3. Patchy airspace opacities within the dependent portions of the bilateral lower lobes, right middle lobe, and lingula suspicious for superimposed pneumonia or aspiration. 4. Mid ascending thoracic aortic aneurysm measuring up to 5.0 cm in diameter (previously 4.8 cm on 05/19/2019). Recommend semi-annual imaging followup by CTA or MRA and referral to cardiothoracic surgery if not already obtained. This recommendation follows 2010 ACCF/AHA/AATS/ACR/ASA/SCA/SCAI/SIR/STS/SVM Guidelines for the Diagnosis and Management of Patients With Thoracic Aortic Disease. Circulation. 2010; 121:: B284-X324 Aortic aneurysm NOS (ICD10-I71.9) 5. Cardiomegaly with evidence of right heart dysfunction. 6. Aortic and coronary artery atherosclerosis.   (ICD10-I70.0). Electronically Signed   By: NDavina PokeD.O.   On: 01/26/2020 13:56     Aspynn Clover T. [place] If 7PM-7AM, please contact night-coverage [email or web address] 01/27/2020, 11:49 AM

## 2020-01-27 NOTE — Consult Note (Signed)
Cardiology Consultation:   Patient ID: HRIDHAAN YOHN MRN: [id number]; DOB: Aug 09, 1934  Admit date: 01/26/2020 Date of Consult: 01/27/2020  Primary Care Provider: [name], MD [facility] Cardiologist: Sanda Klein, MD  [facility] Electrophysiologist:  None    Patient Profile:   Thomas [name] is a 85 y.o. male with a hx of CAD s/p remote CABG with subsequent PCI/BMS to the SVG to OM 2015 with known CTO of the SVG to RCA and patent LIMA to LAD, chronic combined systolic and diastolic CHF with recovery of EF by most recent echo, ICM, Medtronic CRT-D implanted in 2014, permanent Afib not on [place] secondary to underlying microcytic anemia with known AVMs as well as falls, CHB, severe TR, prior GIB requiring PRBC transfusion with subsequent discovery of aforementioned AVMs, CKDIII, prostate CA, HTN, HLD, and deafness, who is being seen today for the evaluation of elevated HS Tn at the request of Dr. Dwyane Dee.  History of Present Illness:   Thomas [name] is an 85 year old male with PMH as above.  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  He underwent ultrasound was performed of his left upper extremity due to LUE>RUE and without evidence of DVT.  He has dysphagia with certain foods and pills. EGD from 2020 showed hiatal hernia but otherwise no significant esophageal pathology.    He has known history of small bowel AVMs due to recurrent GI bleeds.   He has had a CT scan that has raised the possibility of cirrhosis.     He is not on antiplatelet therapy or anticoagulation given history of recurrent GI bleeds and chronic anemia  with known AVMs, as well as history of falls.    Presented to the emergency room 01/15/2020 with emesis containing blood and dark red blood per rectum.  CT scan showed inflammation of the pancreaticoduodenal groove, which was thought to be 2/2 pancreatitis or peptic ulcer disease. He was found to have an upper GI bleed secondary to duodenal ulcers.  He had an embolization done by vascular surgery 01/18/2020 after continuing to bleed.  After embolization, H&H stabilized.  Recommendation was for home health. He was discharged 01/21/2020.  On 01/26/2020, he presented again to the emergency department with report of recent syncope and collapse due to hypoxic respiratory failure. Per son, the patient has had difficulty breathing earlier that morning and then took a shower. After an hour, he was reportedly found between the toilet and the bathtub in his bathroom.  Shallow breathing was noted.  EMS was called.  In the ED, vitals significant for ventricular rate 103 bpm, BP 118/57, 95% on BiPAP.  Chest x-ray showed stable cardiomegaly and central pulmonary vascular congestion.  Stable bibasilar opacities concerning for edema or atelectasis with associated pleural effusions.  Also noted was aortic atherosclerosis.  CTA negative for pulmonary embolism to the lobar branch level.  Moderate bilateral pleural effusions noted.  Patchy airspace opacities within the dependent portions of the lung were noted.  It was noted lung findings were suspicious for superimposed pneumonia or aspiration.  An aortic aneurysm measuring up to 5.0 cm in diameter (previously 4.8 cm 05/19/2019) with recommendation for semiannual imaging follow-up by CTA or MRI and referral  to cardiothoracic surgery was noted.  Cardiomegaly and aortic/coronary artery atherosclerosis were also noted.  Labs showed hyperkalemia with potassium 3.7, creatinine 1.20, BUN 14, BNP 266.7, high-sensitivity troponin 35  83. EKG showed afib with paced rhythm 98bpm and no acute  changes.  Cardiology consulted.  Past Medical History:  Diagnosis Date  . AICD (automatic cardioverter/defibrillator) present   . Bacteremia due to group B Streptococcus    Recurrent admissions for group B Strepotococcus bacteremia of unknown source with TEEs negative for vegetation 06/2019 and 08/2019  . Biventricular ICD (implantable cardioverter-defibrillator) in place 03/24/2005   Implantation of a Medtronic Adapta ADDRO1, serial number [id number]  . CHF (congestive heart failure) ([place])   . CKD (chronic kidney disease), stage III ([place])   . Coronary artery disease    a. s/p CABG 1986. b. Multiple PCIs/caths. c. 09/2013: s/p PTCA and BMS to SVG-OM.  [name]    Requires sign language interpreter  . Dysrhythmia   . History of abdominal aortic aneurysm   . History of bleeding peptic ulcer 1980  . History of epididymitis 2013  . HTN (hypertension)   . Hydronephrosis with ureteropelvic junction obstruction   . Hydroureter on left 2009  . Hypertension   . Ischemic cardiomyopathy    a. Prior EF 30-35%, s/p BIV-ICD. b. 09/2013: EF 45-50%.  . Moderate tricuspid regurgitation   . PAF (paroxysmal atrial fibrillation) (HCC)    Not on [place] 2/2 GIB  . Presence of permanent cardiac pacemaker 2002   Original placed in 2002 for CHB then 2007 and 2014 device change out  . Prostate cancer ([place])   . Status post coronary artery bypass grafting 1986   LIMA to the LAD, SVG to OM, SVG to RCA  . Testicular swelling     Past Surgical History:  Procedure Laterality Date  . 2-D echocardiogram  11/20/2011   Ejection fraction 30-35% moderate concentric left ventricular hypertrophy. Left atrium is moderately dilated. Mild MR. Mild or  . BI-VENTRICULAR IMPLANTABLE CARDIOVERTER DEFIBRILLATOR N/A 12/16/2012   Procedure: BI-VENTRICULAR IMPLANTABLE CARDIOVERTER DEFIBRILLATOR  (CRT-D);  Surgeon: [name], MD;  Location: [facility] CATH LAB;  Service: Cardiovascular;  Laterality: N/A;  . BIV ICD INSERTION CRT-D N/A  09/01/2019   Procedure: BIV ICD INSERTION CRT-D;  Surgeon: [name], MD;  Location: [place] CV LAB;  Service: Cardiovascular;  Laterality: N/A;  . CARDIAC CATHETERIZATION  12/10/2011   SVG to OM widely patent.  LIMA to LAD patent  . CATARACT EXTRACTION W/PHACO Right 10/12/2017   Procedure: CATARACT EXTRACTION PHACO AND INTRAOCULAR LENS PLACEMENT (IOC);  Surgeon: Birder Robson, MD;  Location: [facility] ORS;  Service: Ophthalmology;  Laterality: Right;  [place] 00:57 AP% 15.9 CDE 9.07 Fluid pack lot # [id number]  . COLONOSCOPY N/A 07/13/2018   Procedure: COLONOSCOPY;  Surgeon: Toledo, Benay Pike, MD;  Location: [facility] ENDOSCOPY;  Service: Gastroenterology;  Laterality: N/A;  . CORONARY ARTERY BYPASS GRAFT  1986  . EMBOLIZATION N/A 01/18/2020   Procedure: EMBOLIZATION;  Surgeon: Algernon Huxley, MD;  Location: [place] CV LAB;  Service: Cardiovascular;  Laterality: N/A;  . ENTEROSCOPY N/A 09/14/2018   Procedure: ENTEROSCOPY;  Surgeon: Toledo, Benay Pike, MD;  Location: [facility] ENDOSCOPY;  Service: Gastroenterology;  Laterality: N/A;  symptomatic anemia, GI blood loss anemia, melena, positive small bowel capsule endoscopy showing source of bleeding   . ENTEROSCOPY N/A 08/22/2019   Procedure: ENTEROSCOPY;  Surgeon: Lin Landsman, MD;  Location: [facility] ENDOSCOPY;  Service: Gastroenterology;  Laterality: N/A;  . ESOPHAGOGASTRODUODENOSCOPY  N/A 07/13/2018   Procedure: ESOPHAGOGASTRODUODENOSCOPY (EGD);  Surgeon: Toledo, Benay Pike, MD;  Location: [facility] ENDOSCOPY;  Service: Gastroenterology;  Laterality: N/A;  . ESOPHAGOGASTRODUODENOSCOPY N/A 09/14/2018   Procedure: ESOPHAGOGASTRODUODENOSCOPY (EGD);  Surgeon: Toledo, Benay Pike, MD;  Location: [facility] ENDOSCOPY;  Service: Gastroenterology;  Laterality: N/A;  SIGN LANAGUAGE INTERPRETER  . ESOPHAGOGASTRODUODENOSCOPY N/A 06/21/2019   Procedure: ESOPHAGOGASTRODUODENOSCOPY (EGD);  Surgeon: Toledo, Benay Pike, MD;  Location: [facility] ENDOSCOPY;  Service: Gastroenterology;   Laterality: N/A;  . ESOPHAGOGASTRODUODENOSCOPY N/A 01/15/2020   Procedure: ESOPHAGOGASTRODUODENOSCOPY (EGD);  Surgeon: Lucilla Lame, MD;  Location: [facility] ENDOSCOPY;  Service: Endoscopy;  Laterality: N/A;  . ESOPHAGOGASTRODUODENOSCOPY (EGD) WITH PROPOFOL N/A 05/27/2018   Procedure: ESOPHAGOGASTRODUODENOSCOPY (EGD) WITH PROPOFOL;  Surgeon: Clarene Essex, MD;  Location: [place];  Service: Endoscopy;  Laterality: N/A;  . ICD LEAD REMOVAL Left 08/25/2019   Procedure: ICD LEAD EXTRACTION;  Surgeon: [name], MD;  Location: [place];  Service: Cardiovascular;  Laterality: Left;  DR. Roxan Hockey BACK UP  . INSERT / REPLACE / REMOVE PACEMAKER    . LEFT HEART CATHETERIZATION WITH CORONARY/GRAFT ANGIOGRAM N/A 12/10/2011   Procedure: LEFT HEART CATHETERIZATION WITH Beatrix Fetters;  Surgeon: Sanda Klein, MD;  Location: [place] CATH LAB;  Service: Cardiovascular;  Laterality: N/A;  . LEFT HEART CATHETERIZATION WITH CORONARY/GRAFT ANGIOGRAM N/A 09/25/2013   Procedure: LEFT HEART CATHETERIZATION WITH Beatrix Fetters;  Surgeon: Blane Ohara, MD;  Location: [facility] CATH LAB;  Service: Cardiovascular;  Laterality: N/A;  . MULTIPLE EXTRACTIONS WITH ALVEOLOPLASTY Bilateral 08/30/2019   Procedure: Extractionof tooth #'s 2, 28, and 31 with alveoloplasty and gross debridement of remaining teeth.;  Surgeon: [name], DDS;  Location: [place];  Service: Oral Surgery;  Laterality: Bilateral;  . Persantine Myoview  05/06/2010   Post-rest ejection fraction 30%. No significant ischemia demonstrated. Compared to previous study there is no significant change.  . TEE WITHOUT CARDIOVERSION N/A 06/22/2019   Procedure: TRANSESOPHAGEAL ECHOCARDIOGRAM (TEE);  Surgeon: Minna Merritts, MD;  Location: [facility] ORS;  Service: Cardiovascular;  Laterality: N/A;  . TEE WITHOUT CARDIOVERSION N/A 08/23/2019   Procedure: TRANSESOPHAGEAL ECHOCARDIOGRAM (TEE);  Surgeon: Kate Sable, MD;  Location: [facility] ORS;  Service:  Cardiovascular;  Laterality: N/A;  . TEE WITHOUT CARDIOVERSION N/A 08/25/2019   Procedure: TRANSESOPHAGEAL ECHOCARDIOGRAM (TEE);  Surgeon: [name], MD;  Location: [facility] OR;  Service: Cardiovascular;  Laterality: N/A;  . TRANSURETHRAL RESECTION OF PROSTATE     s/p     Home Medications:  Prior to Admission medications   Medication Sig Start Date End Date Taking? Authorizing Provider  ferrous sulfate 325 (65 FE) MG tablet Take 1 tablet (325 mg total) by mouth 2 (two) times daily with a meal. Patient taking differently: Take 325 mg by mouth daily with breakfast.  09/11/19 01/26/20 Yes Mercy Riding, MD  losartan (COZAAR) 25 MG tablet Take 25 mg by mouth daily.   Yes [provider]  Multiple Vitamin (MULTIVITAMIN WITH MINERALS) TABS tablet Take 1 tablet by mouth daily. 05/29/19  Yes Nicole Kindred A, DO  simvastatin (ZOCOR) 20 MG tablet TAKE 1 TABLET BY MOUTH EVERY DAY Patient taking differently: Take 20 mg by mouth daily at 6 PM.  04/07/19  Yes Kilroy, Doreene Burke, PA-C  spironolactone (ALDACTONE) 25 MG tablet Take 1 tablet (25 mg total) by mouth daily. 08/01/19 01/26/20 Yes Hackney, Otila Kluver A, FNP  torsemide (DEMADEX) 20 MG tablet Take 2 tablets (40 mg total) by mouth 2 (two) times daily. 09/11/19  Yes Mercy Riding, MD  acetaminophen (TYLENOL) 325 MG tablet Take 2 tablets (650 mg total) by mouth every 6 (six) hours as needed for mild pain or fever. 08/23/19   Loletha Grayer, MD  albuterol (PROVENTIL) (2.5 MG/3ML) 0.083% nebulizer solution Inhale 3 mLs into the lungs every 4 (four) hours as needed for wheezing or shortness of breath. 08/23/19   Wieting, Richard, MD  midodrine (PROAMATINE) 5 MG tablet TAKE 1 TABLET (5 MG TOTAL) BY MOUTH 3 (THREE) TIMES DAILY WITH MEALS. 01/23/20   [name], MD  Nystatin (GERHARDT'S BUTT CREAM) CREA Apply 1 application topically 2 (two) times daily. Patient taking differently: Apply 1 application topically as needed.  09/11/19   Mercy Riding, MD  ondansetron  (ZOFRAN) 4 MG tablet Take 1 tablet (4 mg total) by mouth every 8 (eight) hours as needed for nausea or vomiting. Patient not taking: Reported on 01/26/2020 09/12/19 09/11/20  Mercy Riding, MD  pantoprazole (PROTONIX) 40 MG tablet Take 1 tablet (40 mg total) by mouth 2 (two) times daily. Patient not taking: Reported on 01/26/2020 01/21/20 02/20/20  Wyvonnia Dusky, MD    Inpatient Medications: Scheduled Meds: . furosemide  40 mg Intravenous BID  . guaiFENesin  600 mg Oral BID  . ipratropium-albuterol  3 mL Nebulization Q6H  . midodrine  5 mg Oral TID WC  . pantoprazole  40 mg Oral BID  . simvastatin  20 mg Oral Daily  . sodium chloride flush  3 mL Intravenous Q12H   Continuous Infusions: . sodium chloride    . azithromycin Stopped (01/26/20 1639)  . cefTRIAXone (ROCEPHIN)  IV     PRN Meds: sodium chloride, acetaminophen **OR** acetaminophen, bisacodyl, guaiFENesin-dextromethorphan, nitroGLYCERIN, ondansetron **OR** ondansetron (ZOFRAN) IV, polyethylene glycol, sodium chloride flush  Allergies:    Allergies  Allergen Reactions  . Entresto [Sacubitril-Valsartan] Swelling    And bruising of arm  . Phenazopyridine Nausea Only and Other (See Comments)    GI UPSET  . Ramipril Other (See Comments)    unk Other reaction(s): Other (See Comments), Unknown unk    Social History:   Social History   Socioeconomic History  . Marital status: Married    Spouse name: Not on file  . Number of children: Not on file  . Years of education: Not on file  . Highest education level: Not on file  Occupational History  . Not on file  Tobacco Use  . Smoking status: Former Smoker    Quit date: 03/15/1985    Years since quitting: 34.8  . Smokeless tobacco: Never Used  Vaping Use  . Vaping Use: Never used  Substance and Sexual Activity  . Alcohol use: No    Comment: occas.  . Drug use: No  . Sexual activity: Yes  Other Topics Concern  . Not on file  Social History Narrative  . Not on  file   Social Determinants of Health   Financial Resource Strain:   . Difficulty of Paying Living Expenses: Not on file  Food Insecurity:   . Worried About Charity fundraiser in the Last Year: Not on file  . Ran Out of Food in the Last Year: Not on file  Transportation Needs:   . Lack of Transportation (Medical): Not on file  . Lack of Transportation (Non-Medical): Not on file  Physical Activity:   . Days of Exercise per Week: Not on file  . Minutes of Exercise per Session: Not on file  Stress:   . Feeling of Stress : Not  on file  Social Connections:   . Frequency of Communication with Friends and Family: Not on file  . Frequency of Social Gatherings with Friends and Family: Not on file  . Attends Religious Services: Not on file  . Active Member of Clubs or Organizations: Not on file  . Attends Archivist Meetings: Not on file  . Marital Status: Not on file  Intimate Partner Violence:   . Fear of Current or Ex-Partner: Not on file  . Emotionally Abused: Not on file  . Physically Abused: Not on file  . Sexually Abused: Not on file    Family History:    Family History  Problem Relation Age of Onset  . Hypertension Father      ROS:  Please see the history of present illness.  Review of Systems  Constitutional: Positive for malaise/fatigue.  Respiratory: Positive for shortness of breath.   Musculoskeletal: Positive for falls.  Neurological: Positive for loss of consciousness.  All other systems reviewed and are negative.   All other ROS reviewed and negative.     Physical Exam/Data:   Vitals:   01/26/20 2156 01/27/20 0000 01/27/20 0500 01/27/20 0758  BP: (!) 97/52 (!) 96/54 (!) 94/59 105/60  Pulse: 70  70 70  Resp: 18 20    Temp: 98.6 F (37 C) 97.7 F (36.5 C) (!) 97.5 F (36.4 C) 98.7 F (37.1 C)  TempSrc: Oral Axillary Oral Oral  SpO2: 98%  100% 94%  Weight:   80.4 kg   Height:        Intake/Output Summary (Last 24 hours) at 01/27/2020  1140 Last data filed at 01/27/2020 0100 Gross per 24 hour  Intake 831.32 ml  Output 380 ml  Net 451.32 ml   Last 3 Weights 01/27/2020 01/26/2020 01/20/2020  Weight (lbs) 177 lb 4 oz 175 lb 187 lb 3.2 oz  Weight (kg) 80.4 kg 79.379 kg 84.913 kg     Body mass index is 25.43 kg/m.  General:  Elderly male, eating breakfast. HEENT: normal.   Lymph: no adenopathy Neck: no JVD Endocrine:  No thryomegaly Vascular: No carotid bruits; FA pulses 2+ bilaterally without bruits  Cardiac:  normal S1, S2; RRR; significant systolic murmurs appreciated along the LLSB and 5th ICS Lungs: Coarse breath sounds bilaterally, rhonchi or rales  Abd: soft, nontender, no hepatomegaly  Ext: moderate to 1+ right lower extremity edema, moderate left lower extremity edema Musculoskeletal:  No deformities, BUE and BLE strength normal and equal Skin: warm and dry  Neuro:  CNs 2-12 intact, no focal abnormalities noted Psych:  Normal affect   EKG:  The EKG was personally reviewed and demonstrates: Atrial fibrillation with ventricular paced rhythm, 98 bpm, no acute changes Telemetry:  Telemetry was personally reviewed and demonstrates:  Afib, Paced  Relevant CV Studies: ECHO 08/23/2019:  1. Left ventricular ejection fraction, by estimation, is 55 to 60%. The  left ventricle has normal function. The left ventricle has no regional  wall motion abnormalities.  2. Right ventricular systolic function is normal. The right ventricular  size is normal.  3. Left atrial size was mild to moderately dilated. No left atrial/left  atrial appendage thrombus was detected.  4. Right atrial size was mildly dilated.  5. The mitral valve is normal in structure. Mild mitral valve  regurgitation.  6. Tricuspid valve regurgitation is moderate to severe.  7. The aortic valve is tricuspid. Aortic valve regurgitation is mild.  Laboratory Data:  High Sensitivity Troponin:   Recent  Labs  Lab 01/26/20 1205 01/26/20 1500  01/26/20 1840 01/27/20 0017 01/27/20 0730  TROPONINIHS 83* 168* 192* 184* 118*     Chemistry Recent Labs  Lab 01/21/20 0454 01/26/20 0946 01/27/20 0730  NA 136 140 139  K 4.4 3.7 3.9  CL 109 105 105  CO2 24 22 27   GLUCOSE 98 153* 117*  BUN 18 14 18   CREATININE 1.01 1.20 1.26*  CALCIUM 8.0* 8.6* 8.2*  GFRNONAA >60 >60 57*  ANIONGAP 3* 13 7    Recent Labs  Lab 01/26/20 0946 01/27/20 0730  PROT 5.6* 5.2*  ALBUMIN 2.6* 2.2*  AST 35 24  ALT 16 13  ALKPHOS 139* 101  BILITOT 0.9 0.8   Hematology Recent Labs  Lab 01/21/20 0454 01/26/20 0946 01/27/20 0730  WBC 5.6 5.4 6.1  RBC 2.82* 3.17* 2.62*  HGB 7.8* 8.6* 7.1*  HCT 24.7* 28.6* 23.3*  MCV 87.6 90.2 88.9  MCH 27.7 27.1 27.1  MCHC 31.6 30.1 30.5  RDW 19.7* 19.0* 19.2*  PLT 179 305 197   BNP Recent Labs  Lab 01/26/20 0946  BNP 266.7*    DDimer No results for input(s): DDIMER in the last 168 hours.   Radiology/Studies:  CT Angio Chest PE W/Cm &/Or Wo Cm  Result Date: 01/26/2020 CLINICAL DATA:  Hypoxia EXAM: CT ANGIOGRAPHY CHEST WITH CONTRAST TECHNIQUE: Multidetector CT imaging of the chest was performed using the standard protocol during bolus administration of intravenous contrast. Multiplanar CT image reconstructions and MIPs were obtained to evaluate the vascular anatomy. CONTRAST:  45mL OMNIPAQUE IOHEXOL 350 MG/ML SOLN COMPARISON:  CT 05/19/2019 FINDINGS: Cardiovascular: Satisfactory opacification of the pulmonary arteries. No evidence of filling defect to the lobar branch level. Evaluation is somewhat limited by respiratory motion artifact and extensive streak and beam hardening artifact related to patient's pacemaker. Mid ascending thoracic aorta measures 5.0 cm in diameter (previously 4.8 cm on 05/19/2019). There is tortuosity of the descending thoracic aorta. Atherosclerotic calcification of the aorta and coronary arteries. Moderate four-chamber cardiomegaly. Status post CABG. Mediastinum/Nodes: There are  a few mildly prominent mediastinal lymph nodes including index upper right paratracheal node measuring 12 mm (series 6, image 25), previously measured 9 mm. No axillary or hilar lymphadenopathy. Trachea, esophagus, and thyroid gland appear within normal limits. Lungs/Pleura: Moderate bilateral pleural effusions with associated compressive atelectasis. Patchy airspace opacities within the dependent portions of the bilateral lower lobes, right middle lobe, and lingula suspicious for superimposed pneumonia or aspiration. No pneumothorax. Upper Abdomen: Reflux of contrast into the IVC and hepatic veins. Left hepatic lobe cyst. Musculoskeletal: No chest wall abnormality. No acute or significant osseous findings. Review of the MIP images confirms the above findings. IMPRESSION: 1. Negative for pulmonary embolism to the lobar branch level. 2. Moderate bilateral pleural effusions with associated compressive atelectasis. 3. Patchy airspace opacities within the dependent portions of the bilateral lower lobes, right middle lobe, and lingula suspicious for superimposed pneumonia or aspiration. 4. Mid ascending thoracic aortic aneurysm measuring up to 5.0 cm in diameter (previously 4.8 cm on 05/19/2019). Recommend semi-annual imaging followup by CTA or MRA and referral to cardiothoracic surgery if not already obtained. This recommendation follows 2010 ACCF/AHA/AATS/ACR/ASA/SCA/SCAI/SIR/STS/SVM Guidelines for the Diagnosis and Management of Patients With Thoracic Aortic Disease. Circulation. 2010; 121: M384-Y659. Aortic aneurysm NOS (ICD10-I71.9) 5. Cardiomegaly with evidence of right heart dysfunction. 6. Aortic and coronary artery atherosclerosis.  (ICD10-I70.0). Electronically Signed   By: Davina Poke D.O.   On: 01/26/2020 13:56   DG Chest [facility]  Result Date: 01/26/2020 CLINICAL DATA:  Shortness of breath. EXAM: PORTABLE CHEST 1 VIEW COMPARISON:  September 12, 2019. FINDINGS: Stable cardiomegaly. Right-sided  pacemaker is unchanged in position. Central pulmonary vascular congestion is noted. No pneumothorax is noted. Stable bibasilar opacities are noted concerning for edema or atelectasis with associated pleural effusions. Bony thorax is unremarkable. IMPRESSION: Stable cardiomegaly and central pulmonary vascular congestion. Stable bibasilar opacities are noted concerning for edema or atelectasis with associated pleural effusions. Aortic Atherosclerosis (ICD10-I70.0). Electronically Signed   By: Marijo Conception M.D.   On: 01/26/2020 10:00     Assessment and Plan:   Elevated high-sensitivity troponin History of CAD s/p CABG with PCI  [place]and Ischemia suspected --No angina reported. CTA negative for PE. --Presented with weakness and fall after recent admission with GI bleed. --High-sensitivity troponin minimally elevated and flat trending. --EKG without acute ST/T changes. --Not consistent with ACS. --Suspect supply demand ischemia in the setting of his comorbid conditions, including recent GIB. --No plan for invasive ischemic work-up this admission. --Echo ordered by internal medicine.  Will result. --Further recommendations, if indicated, pending results of echo. --No indication for IV heparin. Avoid in setting of recent GIB. --No DAPT in the setting of recurrent GIB.  --Continue medical management as BP and renal function allows.  Permanent Afib not on OAC --Rate controlled. Not on current BB.  --Not on [place] 2/2 previous GIB and known AVMs.CHA2DS2VASc score of at least5 (CHF, HTN, age1, vascular).  --As above, consider [place] of LE due to asymmetric swelling and SOB. CTA negative for PE.   Chronic combined systolic and diastolic CHF/ICM with recovery of EF  S/p CRT-D with reimplantation 08/2019 --No current SOB.  Presented with earlier shortness of breath as outlined above and in the setting of recent GI bleed.  CTA negative for PE. Given asymmetric swelling of LE, could consider LE [place] to rule  out DVT. Consider also SOB 2/2 anemia as contributing to his shortness of breath.   --S/p reimplantation of CRT-D 08/2019 after an infection as above.  --Most recent 08/2019 echo with normal EF and moderate TR.  --Euvolemic on exam. --Monitor I's/O's. --Daily standing weights. --Daily BMET. --CHF education. --Echo ordered and pending. --Continue medical management.  Frequent Falls, Syncope Weakness --History of frequent falls. Consider weakness as 2/2 his recent GIB as above with history of frequent falls. Also considered is possible pna started on empiric abx.      CKDIII --Daily BMET.   Hypokalemia --Replete with goal 4.0. Check Mg with goal 2.0.   HTN --Currently BP well controlled. Continue PTA medications as BP / renal function allows.  Anemia with known AVM; current melena --Recent GIB s/p ablation.       TIMI Risk Score for Unstable Angina or Non-ST Elevation MI:   The patient's TIMI risk score is 5, which indicates a 26% risk of all cause mortality, new or recurrent myocardial infarction or need for urgent revascularization in the next 14 days.  New York Heart Association (NYHA) Functional Class NYHA Class III   CHA2DS2-VASc Score = 5  This indicates a 7.2% annual risk of stroke. The patient's score is based upon: CHF History: 1 HTN History: 1 Diabetes History: 0 Stroke History: 0 Vascular Disease History: 1 Age Score: 2 Gender Score: 0         For questions or updates, please contact [facility] Please consult [email or web address] for contact info under    Signed, [name] Chaco, PA-C  01/27/2020 11:40 AM

## 2020-01-28 ENCOUNTER — Inpatient Hospital Stay (HOSPITAL_COMMUNITY)
Admit: 2020-01-28 | Discharge: 2020-01-28 | Disposition: A | Discharge status: Home / Self Care | Payer: PPO | Attending: Cardiovascular Disease | Admitting: Cardiovascular Disease

## 2020-01-28 DIAGNOSIS — J9601 Acute respiratory failure with hypoxia: Secondary | ICD-10-CM

## 2020-01-28 DIAGNOSIS — I255 Ischemic cardiomyopathy: Secondary | ICD-10-CM

## 2020-01-28 DIAGNOSIS — R55 Syncope and collapse: Secondary | ICD-10-CM | POA: Diagnosis not present

## 2020-01-28 DIAGNOSIS — I5042 Chronic combined systolic (congestive) and diastolic (congestive) heart failure: Secondary | ICD-10-CM

## 2020-01-28 DIAGNOSIS — I2510 Atherosclerotic heart disease of native coronary artery without angina pectoris: Secondary | ICD-10-CM | POA: Diagnosis not present

## 2020-01-28 DIAGNOSIS — Z95810 Presence of automatic (implantable) cardiac defibrillator: Secondary | ICD-10-CM | POA: Diagnosis not present

## 2020-01-28 DIAGNOSIS — H9193 Unspecified hearing loss, bilateral: Secondary | ICD-10-CM

## 2020-01-28 DIAGNOSIS — A419 Sepsis, unspecified organism: Secondary | ICD-10-CM | POA: Diagnosis not present

## 2020-01-28 DIAGNOSIS — J189 Pneumonia, unspecified organism: Secondary | ICD-10-CM | POA: Diagnosis not present

## 2020-01-28 LAB — HEMOGLOBIN AND HEMATOCRIT, BLOOD
HCT: 24.1 % — ABNORMAL LOW (ref 39.0–52.0)
Hemoglobin: 7.8 g/dL — ABNORMAL LOW (ref 13.0–17.0)

## 2020-01-28 LAB — RENAL FUNCTION PANEL
Albumin: 2.2 g/dL — ABNORMAL LOW (ref 3.5–5.0)
Anion gap: 10 (ref 5–15)
BUN: 22 mg/dL (ref 8–23)
CO2: 24 mmol/L (ref 22–32)
Calcium: 8 mg/dL — ABNORMAL LOW (ref 8.9–10.3)
Chloride: 100 mmol/L (ref 98–111)
Creatinine, Ser: 1.19 mg/dL (ref 0.61–1.24)
GFR, Estimated: >60 mL/min (ref >60)
Glucose, Bld: 114 mg/dL — ABNORMAL HIGH (ref 70–99)
Phosphorus: 3.4 mg/dL (ref 2.5–4.6)
Potassium: 3.9 mmol/L (ref 3.5–5.1)
Sodium: 134 mmol/L — ABNORMAL LOW (ref 135–145)

## 2020-01-28 LAB — CBC
HCT: 24.4 % — ABNORMAL LOW (ref 39.0–52.0)
HCT: 26.1 % — ABNORMAL LOW (ref 39.0–52.0)
Hemoglobin: 7.6 g/dL — ABNORMAL LOW (ref 13.0–17.0)
Hemoglobin: 8.3 g/dL — ABNORMAL LOW (ref 13.0–17.0)
MCH: 26.8 pg (ref 26.0–34.0)
MCH: 27.2 pg (ref 26.0–34.0)
MCHC: 31.1 g/dL (ref 30.0–36.0)
MCHC: 31.8 g/dL (ref 30.0–36.0)
MCV: 85.9 fL (ref 80.0–100.0)
MCV: 85.6 fL (ref 80.0–100.0)
Platelets: 211 10*3/uL (ref 150–400)
Platelets: 204 10*3/uL (ref 150–400)
RBC: 2.84 MIL/uL — ABNORMAL LOW (ref 4.22–5.81)
RBC: 3.05 MIL/uL — ABNORMAL LOW (ref 4.22–5.81)
RDW: 18.6 % — ABNORMAL HIGH (ref 11.5–15.5)
RDW: 18.2 % — ABNORMAL HIGH (ref 11.5–15.5)
WBC: 6.6 10*3/uL (ref 4.0–10.5)
WBC: 6.8 10*3/uL (ref 4.0–10.5)
nRBC: 0 % (ref 0.0–0.2)
nRBC: 0 % (ref 0.0–0.2)

## 2020-01-28 LAB — ECHOCARDIOGRAM COMPLETE
AR max vel: 3.58 cm2
AV Peak grad: 9.5 mmHg
Ao pk vel: 1.54 m/s
Area-P 1/2: 4.31 cm2
Height: 70 in
S' Lateral: 5.14 cm
Single Plane A2C EF: 51.4 %
Weight: 2818.01 oz

## 2020-01-28 LAB — PREPARE RBC (CROSSMATCH)

## 2020-01-28 LAB — MAGNESIUM: Magnesium: 1.9 mg/dL (ref 1.7–2.4)

## 2020-01-28 MED ORDER — SODIUM CHLORIDE 0.9% IV SOLUTION: Freq: Once | INTRAVENOUS | Status: AC

## 2020-01-28 MED ORDER — IPRATROPIUM-ALBUTEROL 0.5-2.5 (3) MG/3ML IN SOLN
3.0000 mL | Freq: Three times a day (TID) | RESPIRATORY_TRACT | Status: DC
  Administered 2020-01-29 – 2020-01-30 (×4): 3 mL via RESPIRATORY_TRACT
  Filled 2020-01-28 (×4): qty 3

## 2020-01-28 MED ORDER — DOCUSATE SODIUM 100 MG PO CAPS
200.0000 mg | ORAL_CAPSULE | Freq: Every day | ORAL | Status: DC
  Administered 2020-01-28 – 2020-01-31 (×4): 200 mg via ORAL
  Filled 2020-01-28 (×4): qty 2

## 2020-01-28 MED ORDER — POLYETHYLENE GLYCOL 3350 17 G PO PACK: 17.0000 g | PACK | Freq: Two times a day (BID) | ORAL | Status: DC | PRN

## 2020-01-28 MED ORDER — FUROSEMIDE 10 MG/ML IJ SOLN
40.0000 mg | Freq: Two times a day (BID) | INTRAMUSCULAR | Status: DC
  Administered 2020-01-28 – 2020-01-31 (×6): 40 mg via INTRAVENOUS
  Filled 2020-01-28 (×6): qty 4

## 2020-01-28 NOTE — TOC Progression Note (Signed)
Transition of Care [facility]) - Progression Note    Patient Details  Name: Thomas [name] MRN: [id number] Date of Birth: February 20, 1935  Transition of Care [facility]) CM/SW Contact  Izola Price, RN Phone Number: 01/28/2020, 2:32 PM  Clinical Narrative:     Wife and patient are deaf and use sign language interpretor and have a sign language phone call interpreter already set up.   Had left VM with spouse regarding agency preference and to try to get TOC assessment. She will be in his room tomorrow but was too tired today.   She does not have a preference for [facility] services. Had questions about insurance coverage.    Will not be home Tuesday to receive him. Son may be available.   She discussed and CM listened to translation of the event and how stressed out she is.  Simmie Davies RN CM        Expected Discharge Plan and Services                                                 Social Determinants of Health (SDOH) Interventions    Readmission Risk Interventions Readmission Risk Prevention Plan 08/21/2019 06/23/2019 06/20/2019  Transportation Screening Complete Complete Complete  PCP or Specialist Appt within 3-5 Days - Complete -  [place]RI or [place] - Complete Complete  Social Work Consult for [place] Planning/Counseling - Complete -  Palliative Care Screening - Not Applicable Not Applicable  Medication Review Press photographer) Complete Complete Complete  PCP or Specialist appointment within 3-5 days of discharge Complete - -  [place] or Home Care Consult Complete - -  [place] Complete - -  Some recent data might be hidden

## 2020-01-28 NOTE — Evaluation (Addendum)
Occupational Therapy Evaluation Patient Details Name: Thomas [name] MRN: [id number] DOB: 10/18/1934 Today's Date: 01/28/2020    History of Present Illness Thomas [name] is an 82yoM who [facility] after fall in BR at home, found on floor by wife. PMH: hearing impairment uses ASL, CAD, high-grade AVB s/p BiV-ICD, sCHF c recovered EF, CKD-3A, A. fib not on AC due to GI bleed/duodenal AVM, lymphedema, PUD, PrCA.  He had right-sided BiV-ICD insertion CRT-D on 6/18. Pt recently here and DC on 11/7 back to home c homehealth services. CTA chest negative for acute PE, but does reveal 5cm TAA, increased from 4.8cm on 05/19/19 imaging study.   Clinical Impression   Thomas [name] was seen for OT evaluation this date. ASL interpreter (ID # [id number]) utilized t/o. Prior to hospital admission, pt was MOD I for mobility using SPC and required assistance for IADLs and LBD from family. Pt lives with wife and son available 24/7. Pt presents to acute OT demonstrating impaired ADL performance and functional mobility 2/2 decreased safety awareness and functional balance/endurance deficits. Pt currently requires close SBA for toilet t/fs - pt mildly impulsive and refuses hands on assist/RW despite furniture walking. Pt would benefit from skilled OT to address noted impairments and functional limitations (see below for any additional details) in order to maximize safety and independence while minimizing falls risk and caregiver burden. Upon hospital discharge, recommend SUPERVISION at home, no OT follow up.     Follow Up Recommendations  No OT follow up;Supervision - Intermittent    Equipment Recommendations  None recommended by OT    Recommendations for Other Services       Precautions / Restrictions Precautions Precautions: Fall Restrictions Weight Bearing Restrictions: No      Mobility Bed Mobility Overal bed mobility: Modified Independent       Transfers Overall transfer level: Modified  independent Equipment used: None       Balance Overall balance assessment: Needs assistance Sitting-balance support: No upper extremity supported;Feet supported Sitting balance-Leahy Scale: Normal     Standing balance support: No upper extremity supported;During functional activity Standing balance-Leahy Scale: Fair            ADL either performed or assessed with clinical judgement   ADL Overall ADL's : Needs assistance/impaired      General ADL Comments: MOD A don B socks seated EOB - assist for threading over toes/heels. CGA for toilet t/f and standing hand hygiene - pt noted to furniture walk and impulsive                  Pertinent Vitals/Pain Pain Assessment: No/denies pain     Hand Dominance Right   Extremity/Trunk Assessment Upper Extremity Assessment Upper Extremity Assessment: Overall WFL for tasks assessed   Lower Extremity Assessment Lower Extremity Assessment: Overall WFL for tasks assessed       Communication Communication Communication: Deaf (ASL)   Cognition Arousal/Alertness: Awake/alert Behavior During Therapy: Impulsive;WFL for tasks assessed/performed Overall Cognitive Status: Within Functional Limits for tasks assessed               Exercises Exercises: Other exercises Other Exercises Other Exercises: Pt educated re: OT role, DME recs, d/c recs Other Exercises: LBD, toileting, sup<>sit, sit<>stand, ~15 in room mobility, sitting/standing balance/tolerance   Shoulder Instructions      Home Living Family/patient expects to be discharged to:: Private residence Living Arrangements: Spouse/significant other Available Help at Discharge: Family;Available 24 hours/day (wife/son) Type of Home: House Home Access:  Level entry     Home Layout: One level     Bathroom Shower/Tub: Teacher, early years/pre: Standard     Home Equipment: Environmental consultant - 2 wheels;Cane - single point;Other (comment)   Additional Comments: Went home  with O2 last admission (last week) but was not using      Prior Functioning/Environment Level of Independence: Independent with assistive device(s)        Comments: SPC in the home, has had multiple recent falls, reports he bathes himself, wife and son assist with LBD and IADLs        OT Problem List: Decreased activity tolerance;Impaired balance (sitting and/or standing);Decreased safety awareness      OT Treatment/Interventions: Self-care/ADL training;Therapeutic exercise;Energy conservation;DME and/or AE instruction;Balance training;Therapeutic activities    OT Goals(Current goals can be found in the care plan section) Acute Rehab OT Goals Patient Stated Goal: Go home OT Goal Formulation: With patient Time For Goal Achievement: 02/11/20 Potential to Achieve Goals: Good ADL Goals Pt Will Perform Grooming: standing;Independently Pt Will Transfer to Toilet: Independently;ambulating Additional ADL Goal #1: Pt will Independently verbalize plan to implement x3 falls prevention strategies.  OT Frequency: Min 1X/week    AM-PAC OT "6 Clicks" Daily Activity     Outcome Measure Help from another person eating meals?: None Help from another person taking care of personal grooming?: A Little Help from another person toileting, which includes using toliet, bedpan, or urinal?: A Little Help from another person bathing (including washing, rinsing, drying)?: A Little Help from another person to put on and taking off regular upper body clothing?: None Help from another person to put on and taking off regular lower body clothing?: A Lot 6 Click Score: 19   End of Session Nurse Communication: Mobility status  Activity Tolerance: Patient tolerated treatment well Patient left: in bed;with call bell/phone within reach;with bed alarm set  OT Visit Diagnosis: Other abnormalities of gait and mobility (R26.89)                Time: 1438-8875 OT Time Calculation (min): 14 min Charges:  OT  General Charges $OT Visit: 1 Visit OT Evaluation $OT Eval Low Complexity: 1 Low OT Treatments $Self Care/Home Management : 8-22 mins  Thomas [name], M.S. OTR/L  01/28/20, 10:25 AM  ascom [phone]

## 2020-01-28 NOTE — Progress Notes (Signed)
PROGRESS NOTE  FABIANO GINLEY VOH:[id number] DOB: 08/19/1934   PCP: [name], MD  Patient is from: home.  Lives with his wife.  Uses walker at baseline.  DOA: 01/26/2020 LOS: 2  Chief complaints: Syncopal fall at home  Brief Narrative / Interim history: 85 year old male legally deaf and communicates with sign language, CAD/CABG s/p PCI, A. fib not on AC due to GI bleed, systolic CHF/second-degree AVB s/p BiV AICD, CVA without residual deficit, GIB with duodenal AVM and treated prostate cancer brought to ED by EMS after syncopal episode at home.  Reportedly started feeling short of breath in the morning. It seems he went to take shower and found down between the toilet commode and the bus stop.  Patient denies hitting his head or LOC.  When EMS arrived, hypoxic to 47% on RA, and recovered to 91% on CPAP.  In ED, slightly tachycardic to 100s.  Tachypneic to 30s.  BP 118/57.  95% on BiPAP.  WBC 5.4.  Hgb 8.6 (higher than baseline).  ABG normal.  BNP 266 (lower than baseline).  Troponin 35> 83.  COVID-19, influenza and RSV PCR negative.  UA without significant finding.  EKG paced rhythm.  CXR concerning for CHF.  Procalcitonin elevated.  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   Admitted for acute respiratory failure, sepsis, pneumonia and possible CHF. Started on ceftriaxone, azithromycin and IV Lasix.  Cardiology consulted for elevated troponin.   Subjective: Seen and examined earlier this morning with the help of sign language interpreter over iPad.  No major events overnight of this morning.  No complaints.  He denies dizziness, palpitation, chest pain or shortness of breath.  Thinks he might be constipated.  Objective: Vitals:   01/28/20 0322 01/28/20 0749 01/28/20 1042 01/28/20  1103  BP: (!) 115/59 117/73 107/71 109/63  Pulse: 72 71 82 69  Resp: 18 16    Temp: 97.7 F (36.5 C) 97.8 F (36.6 C) 98 F (36.7 C) 98.2 F (36.8 C)  TempSrc: Oral Axillary Oral Oral  SpO2: 94% 94% 93% 92%  Weight: 79.9 kg     Height:        Intake/Output Summary (Last 24 hours) at 01/28/2020 1123 Last data filed at 01/28/2020 1044 Gross per 24 hour  Intake 1249.2 ml  Output --  Net 1249.2 ml   Filed Weights   01/26/20 0944 01/27/20 0500 01/28/20 0322  Weight: 79.4 kg 80.4 kg 79.9 kg    Examination: GENERAL: No apparent distress.  Nontoxic. HEENT: MMM.  Vision and hearing grossly intact.  NECK: Supple.  Notable JVD. RESP: 92% on RA.  No IWOB.  Bibasilar crackles. CVS:  RRR. Heart sounds normal.  ABD/GI/GU: BS+. Abd soft, NTND.  MSK/EXT:  Moves extremities. No apparent deformity.  1+ edema bilaterally. SKIN: no apparent skin lesion or wound NEURO: Awake, alert and oriented appropriately.  No apparent focal neuro deficit. PSYCH: Calm. Normal affect.   Procedures:  None  Microbiology summarized: [id number], influenza and RSV PCR nonreactive.  Assessment & Plan: Acute hypoxemic respiratory failure-multifactorial including sepsis, community-acquired pneumonia, pleural effusion, CHF exacerbation and anemia.  Reportedly desaturated to 47% when EMS arrived.  Started on BiPAP.  Currently on room air but crackles and edema. -Wean oxygen as able -Treat treatable causes as below.  Severe sepsis due to community-acquired pneumonia-POA.  Concerned about aspiration pneumonia.  Met criteria for severe sepsis with tachycardia, pneumonia and respiratory failure with hypoxia requiring BiPAP.  Procalcitonin elevated.  Blood culture pending but Gram stain with GPC and GPR.  Sepsis physiology resolving. -Continue ceftriaxone and azithromycin -Aspiration precautions.  Appreciate input by SLP. -Encourage incentive spirometry and flutter valve -Mucolytic's and antitussives. -Follow  blood cultures.  Acute on chronic systolic CHF with recovered EF s/p CRT-D in 08/2019: TEE on 08/2019 with EF of 55 to 60% (previously 35 to 40%), moderate to severe TVR.  Has dyspnea and evidence of pulmonary congestion on CXR and CT but BNP lower than baseline.  I&O incomplete.  Has JVD, crackles and edema on exam.  Creatinine improved. -Cardiology following. -Continue IV Lasix 40 mg daily -Continue holding Aldactone losartan in the setting of soft blood pressures -Monitor fluid status, renal function and electrolytes -Follow repeat TTE.  Moderate bilateral pleural effusion: Likely due to CHF but difficult to exclude parapneumonic effusion.   -Given improvement in his respiratory status, treat with diuretics and recheck chest x-ray. -If no improvement after diuretics, consider thoracocentesis  Syncopal fall: Multiple potential etiologies including sepsis, respiratory failure, anemia and arrhythmia.  No head impact or loss of consciousness -Treat sepsis as above. -Transfusing for anemia. -Continue telemetry monitoring -Follow TTE -PT/OT eval. -Cardiology following.  Nonsustained V. tach: Patient had 18 beats of V. tach on 11/13.  He felt brief flutters, dizziness and shortness of breath.  Currently asymptomatic. -Defer to cardiology. -Optimize electrolytes.  Chronic hypotension: On midodrine at home. -Continue home midodrine  Elevated troponin: High-sensitivity troponin 35>> 192 (peak)> 184.  Likely demand ischemia in the setting of hypoxia and sepsis.[name]  EKG without acute ischemic finding.  CTA negative for PE.  Patient without chest pain.  -Appreciate input by cardiology -Follow TTE.  History of  CAD s/p CABG and  multiple PCI: Elevated troponin but no chest pain.  EKG without acute ischemic finding. -Continue home medications -Follow TTE  Second-degree AV block s/p AICD Permanent atrial fibrillation: Rate controlled without medication..  Not on AC due to GI bleed.  Anemia of  chronic disease and chronic GI bleed: Hgb 8.6 on admit> 6.7>1u> 7.6>1u>.  Baseline 7.0-8.0.  Denies melena or hematochezia. -Check Hemoccult -Continue holding aspirin and Lovenox. -SCD for VTE prophylaxis -Transfuse a unit of blood.  Keep hemoglobin greater than 8.0.  Legally deaf, communicates with sign language. -Use language iPad   Debility/physical deconditioning: Uses walker at baseline. -PT/OT eval  Constipation: Has not a bowel movement in 2 days. -Bowel regimen.  Body mass index is 25.27 kg/m.       Pressure Injury 06/21/19 Buttocks Mid Stage 2 -  Partial thickness loss of dermis presenting as a shallow open injury with a red, pink wound bed without slough. (Active)  06/21/19 0700  Location: Buttocks  Location Orientation: Mid  Staging: Stage 2 -  Partial thickness loss of dermis presenting as a shallow open injury with a red, pink wound bed without slough.  Wound Description (Comments):   Present on Admission:    DVT prophylaxis:  Place and maintain sequential compression device Start: 01/27/20 1009 SCDs Start: 01/26/20 1757  Code Status: Full code Family Communication: Left voicemail for patient's wife using sign language interpreter Status is: Inpatient  Remains inpatient appropriate because:Ongoing diagnostic testing needed not appropriate for outpatient work up, IV treatments appropriate due to intensity of illness or inability to take PO and Inpatient level of care appropriate due to severity of illness   Dispo: The patient is  from: Home              Anticipated d/c is to: Home              Anticipated d/c date is: 3 days              Patient currently is not medically stable to d/c.       Consultants:  Cardiology   Sch Meds:  Scheduled Meds:  docusate sodium  200 mg Oral Daily   furosemide  40 mg Intravenous Daily   guaiFENesin  600 mg Oral BID   ipratropium-albuterol  3 mL Nebulization Q6H   midodrine  10 mg Oral TID WC    pantoprazole  40 mg Oral BID   simvastatin  20 mg Oral Daily   sodium chloride flush  3 mL Intravenous Q12H   Continuous Infusions:  sodium chloride     azithromycin 20 mL/hr at 01/27/20 1600   cefTRIAXone (ROCEPHIN)  IV Stopped (01/27/20 1448)   PRN Meds:.sodium chloride, acetaminophen **OR** acetaminophen, bisacodyl, guaiFENesin-dextromethorphan, nitroGLYCERIN, ondansetron **OR** ondansetron (ZOFRAN) IV, polyethylene glycol, sodium chloride flush  Antimicrobials: Anti-infectives (From admission, onward)   Start     Dose/Rate Route Frequency Ordered Stop   01/27/20 1430  cefTRIAXone (ROCEPHIN) 2 g in sodium chloride 0.9 % 100 mL IVPB        2 g 200 mL/hr over 30 Minutes Intravenous Every 24 hours 01/27/20 0803     01/26/20 1430  cefTRIAXone (ROCEPHIN) 1 g in sodium chloride 0.9 % 100 mL IVPB  Status:  Discontinued        1 g 200 mL/hr over 30 Minutes Intravenous Every 24 hours 01/26/20 1422 01/27/20 0803   01/26/20 1430  azithromycin (ZITHROMAX) 500 mg in sodium chloride 0.9 % 250 mL IVPB        500 mg 250 mL/hr over 60 Minutes Intravenous Every 24 hours 01/26/20 1422         I have personally reviewed the following labs and images: CBC: Recent Labs  Lab 01/26/20 0946 01/27/20 0730 01/27/20 1725 01/28/20 0044 01/28/20 0445  WBC 5.4 6.1  --   --  6.6  NEUTROABS 4.4  --   --   --   --   HGB 8.6* 7.1* 6.7* 7.8* 7.6*  HCT 28.6* 23.3* 21.8* 24.1* 24.4*  MCV 90.2 88.9  --   --  85.9  PLT 305 197  --   --  211   BMP &GFR Recent Labs  Lab 01/26/20 0946 01/27/20 0730 01/27/20 1725 01/28/20 0443 01/28/20 0445  NA 140 139  --  134*  --   K 3.7 3.9  --  3.9  --   CL 105 105  --  100  --   CO2 22 27  --  24  --   GLUCOSE 153* 117*  --  114*  --   BUN 14 18  --  22  --   CREATININE 1.20 1.26*  --  1.19  --   CALCIUM 8.6* 8.2*  --  8.0*  --   MG  --   --  1.9  --  1.9  PHOS  --   --   --  3.4  --    Estimated Creatinine Clearance: 49.4 mL/min (by C-G formula based  on SCr of 1.19 mg/dL). Liver & Pancreas: Recent Labs  Lab 01/26/20 0946 01/27/20 0730 01/28/20 0443  AST 35 24  --   ALT 16 13  --  ALKPHOS 139* 101  --   BILITOT 0.9 0.8  --   PROT 5.6* 5.2*  --   ALBUMIN 2.6* 2.2* 2.2*   No results for input(s): LIPASE, AMYLASE in the last 168 hours. No results for input(s): AMMONIA in the last 168 hours. Diabetic: Recent Labs    01/26/20 1840  HGBA1C 4.8   No results for input(s): GLUCAP in the last 168 hours. Cardiac Enzymes: No results for input(s): CKTOTAL, CKMB, CKMBINDEX, TROPONINI in the last 168 hours. No results for input(s): PROBNP in the last 8760 hours. Coagulation Profile: No results for input(s): INR, PROTIME in the last 168 hours. Thyroid Function Tests: No results for input(s): TSH, T4TOTAL, FREET4, T3FREE, THYROIDAB in the last 72 hours. Lipid Profile: No results for input(s): CHOL, HDL, LDLCALC, TRIG, CHOLHDL, LDLDIRECT in the last 72 hours. Anemia Panel: No results for input(s): VITAMINB12, FOLATE, FERRITIN, TIBC, IRON, RETICCTPCT in the last 72 hours. Urine analysis:    Component Value Date/Time   COLORURINE YELLOW (A) 01/26/2020 0941   APPEARANCEUR CLEAR (A) 01/26/2020 0941   APPEARANCEUR Clear 12/13/2017 0918   LABSPEC 1.017 01/26/2020 0941   LABSPEC 1.021 11/19/2011 1501   PHURINE 5.0 01/26/2020 0941   GLUCOSEU NEGATIVE 01/26/2020 0941   GLUCOSEU Negative 11/19/2011 1501   HGBUR NEGATIVE 01/26/2020 0941   BILIRUBINUR NEGATIVE 01/26/2020 0941   BILIRUBINUR Negative 12/13/2017 0918   BILIRUBINUR Negative 11/19/2011 1501   KETONESUR NEGATIVE 01/26/2020 0941   PROTEINUR NEGATIVE 01/26/2020 0941   NITRITE NEGATIVE 01/26/2020 0941   LEUKOCYTESUR NEGATIVE 01/26/2020 0941   LEUKOCYTESUR Negative 11/19/2011 1501   Sepsis Labs: Invalid input(s): PROCALCITONIN, [place]  Microbiology: Recent Results (from the past 240 hour(s))  Respiratory Panel by RT PCR (Flu A&B, Covid) - Nasopharyngeal Swab     Status:  None   Collection Time: 01/26/20  9:46 AM   Specimen: Nasopharyngeal Swab  Result Value Ref Range Status   SARS Coronavirus 2 by RT PCR NEGATIVE NEGATIVE Final    Comment: (NOTE) SARS-CoV-2 target nucleic acids are NOT DETECTED.  The SARS-CoV-2 RNA is generally detectable in upper respiratoy specimens during the acute phase of infection. The lowest concentration of SARS-CoV-2 viral copies this assay can detect is 131 copies/mL. A negative result does not preclude SARS-Cov-2 infection and should not be used as the sole basis for treatment or other patient management decisions. A negative result may occur with  improper specimen collection/handling, submission of specimen other than nasopharyngeal swab, presence of viral mutation(s) within the areas targeted by this assay, and inadequate number of viral copies (<131 copies/mL). A negative result must be combined with clinical observations, patient history, and epidemiological information. The expected result is Negative.  Fact Sheet for Patients:  PinkCheek.be  Fact Sheet for Healthcare Providers:  GravelBags.it  This test is no t yet approved or cleared by the [place] FDA and  has been authorized for detection and/or diagnosis of SARS-CoV-2 by FDA under an Emergency Use Authorization (EUA). This EUA will remain  in effect (meaning this test can be used) for the duration of the COVID-19 declaration under Section 564(b)(1) of the Act, 21 U.S.C. section 360bbb-3(b)(1), unless the authorization is terminated or revoked sooner.     Influenza A by PCR NEGATIVE NEGATIVE Final   Influenza B by PCR NEGATIVE NEGATIVE Final    Comment: (NOTE) The Xpert Xpress SARS-CoV-2/FLU/RSV assay is intended as an aid in  the diagnosis of influenza from Nasopharyngeal swab specimens and  should not be used as a  sole basis for treatment. Nasal washings and  aspirates are unacceptable for  Xpert Xpress SARS-CoV-2/FLU/RSV  testing.  Fact Sheet for Patients: PinkCheek.be  Fact Sheet for Healthcare Providers: GravelBags.it  This test is not yet approved or cleared by the [place] FDA and  has been authorized for detection and/or diagnosis of SARS-CoV-2 by  FDA under an Emergency Use Authorization (EUA). This EUA will remain  in effect (meaning this test can be used) for the duration of the  Covid-19 declaration under Section 564(b)(1) of the Act, 21  U.S.C. section 360bbb-3(b)(1), unless the authorization is  terminated or revoked. Performed at [facility], [address] [place], [place] [address]   Expectorated sputum assessment w rflx to resp cult     Status: None   Collection Time: 01/26/20  2:20 PM   Specimen: Sputum  Result Value Ref Range Status   Specimen Description SPUTUM  Final   Special Requests NONE  Final   Sputum evaluation   Final    THIS SPECIMEN IS ACCEPTABLE FOR SPUTUM CULTURE Performed at [facility], [address], [place], [place] [address]    Report Status 01/26/2020 FINAL  Final  Culture, respiratory     Status: None (Preliminary result)   Collection Time: 01/26/20  2:20 PM   Specimen: SPU  Result Value Ref Range Status   Specimen Description   Final    SPUTUM Performed at [facility], [address] [place], [place] [address]    Special Requests   Final    NONE Reflexed from [id number] Performed at [facility], [address] [place], [place] [address]    Gram Stain   Final    ABUNDANT WBC PRESENT, PREDOMINANTLY PMN MODERATE GRAM POSITIVE COCCI RARE GRAM POSITIVE RODS    Culture   Final    CULTURE REINCUBATED FOR BETTER GROWTH Performed at [facility], [place] [address], [place], [place] [address]    Report Status PENDING  Incomplete    Radiology Studies: No results found.   Adib [place]  If  7PM-7AM, please contact night-coverage [email or web address] 01/28/2020, 11:23 AM

## 2020-01-28 NOTE — Progress Notes (Signed)
*  PRELIMINARY RESULTS* Echocardiogram 2D Echocardiogram has been performed.  Thomas [name] 01/28/2020, 2:53 PM

## 2020-01-28 NOTE — Progress Notes (Addendum)
Progress Note  Patient Name: Thomas [name] Date of Encounter: 01/28/2020  [facility] Cardiologist: Sanda Klein, MD  Subjective   No complaints this morning, watching television Reports breathing stable, gives me the thumbs up Received 1 unit packed red blood cells, Hemoglobin 7.6  Inpatient Medications    Scheduled Meds: . docusate sodium  200 mg Oral Daily  . furosemide  40 mg Intravenous Daily  . guaiFENesin  600 mg Oral BID  . ipratropium-albuterol  3 mL Nebulization Q6H  . midodrine  10 mg Oral TID WC  . pantoprazole  40 mg Oral BID  . simvastatin  20 mg Oral Daily  . sodium chloride flush  3 mL Intravenous Q12H   Continuous Infusions: . sodium chloride    . azithromycin 20 mL/hr at 01/27/20 1600  . cefTRIAXone (ROCEPHIN)  IV Stopped (01/27/20 1448)   PRN Meds: sodium chloride, acetaminophen **OR** acetaminophen, bisacodyl, guaiFENesin-dextromethorphan, nitroGLYCERIN, ondansetron **OR** ondansetron (ZOFRAN) IV, polyethylene glycol, sodium chloride flush   Vital Signs    Vitals:   01/28/20 0322 01/28/20 0749 01/28/20 1042 01/28/20 1103  BP: (!) 115/59 117/73 107/71 109/63  Pulse: 72 71 82 69  Resp: 18 16    Temp: 97.7 F (36.5 C) 97.8 F (36.6 C) 98 F (36.7 C) 98.2 F (36.8 C)  TempSrc: Oral Axillary Oral Oral  SpO2: 94% 94% 93% 92%  Weight: 79.9 kg     Height:        Intake/Output Summary (Last 24 hours) at 01/28/2020 1311 Last data filed at 01/28/2020 1230 Gross per 24 hour  Intake 1249.2 ml  Output 1425 ml  Net -175.8 ml   Last 3 Weights 01/28/2020 01/27/2020 01/26/2020  Weight (lbs) 176 lb 2 oz 177 lb 4 oz 175 lb  Weight (kg) 79.89 kg 80.4 kg 79.379 kg      Telemetry    Atrial fibrillation rate 70-80- personally Reviewed  ECG     - Personally Reviewed  Physical Exam   GEN: No acute distress.  Thin, pale Neck: No JVD Cardiac:  Irregularly irregular, no murmurs, rubs, or gallops.  Respiratory: Clear to auscultation  bilaterally, scattered Rales, wheezes GI: Soft, nontender, non-distended  MS: No edema; No deformity. Neuro:  Nonfocal  Psych: Normal affect , very hard of hearing  Labs    High Sensitivity Troponin:   Recent Labs  Lab 01/26/20 1205 01/26/20 1500 01/26/20 1840 01/27/20 0017 01/27/20 0730  TROPONINIHS 83* 168* 192* 184* 118*      Chemistry Recent Labs  Lab 01/26/20 0946 01/27/20 0730 01/28/20 0443  NA 140 139 134*  K 3.7 3.9 3.9  CL 105 105 100  CO2 22 27 24   GLUCOSE 153* 117* 114*  BUN 14 18 22   CREATININE 1.20 1.26* 1.19  CALCIUM 8.6* 8.2* 8.0*  PROT 5.6* 5.2*  --   ALBUMIN 2.6* 2.2* 2.2*  AST 35 24  --   ALT 16 13  --   ALKPHOS 139* 101  --   BILITOT 0.9 0.8  --   GFRNONAA >60 57* >60  ANIONGAP 13 7 10      Hematology Recent Labs  Lab 01/26/20 0946 01/26/20 0946 01/27/20 0730 01/27/20 0730 01/27/20 1725 01/28/20 0044 01/28/20 0445  WBC 5.4  --  6.1  --   --   --  6.6  RBC 3.17*  --  2.62*  --   --   --  2.84*  HGB 8.6*   < > 7.1*   < > 6.7*  7.8* 7.6*  HCT 28.6*   < > 23.3*   < > 21.8* 24.1* 24.4*  MCV 90.2  --  88.9  --   --   --  85.9  MCH 27.1  --  27.1  --   --   --  26.8  MCHC 30.1  --  30.5  --   --   --  31.1  RDW 19.0*  --  19.2*  --   --   --  18.6*  PLT 305  --  197  --   --   --  211   < > = values in this interval not displayed.    BNP Recent Labs  Lab 01/26/20 0946  BNP 266.7*     DDimer No results for input(s): DDIMER in the last 168 hours.   Radiology    CT Angio Chest PE W/Cm &/Or Wo Cm  Result Date: 01/26/2020 CLINICAL DATA:  Hypoxia EXAM: CT ANGIOGRAPHY CHEST WITH CONTRAST TECHNIQUE: Multidetector CT imaging of the chest was performed using the standard protocol during bolus administration of intravenous contrast. Multiplanar CT image reconstructions and MIPs were obtained to evaluate the vascular anatomy. CONTRAST:  83mL OMNIPAQUE IOHEXOL 350 MG/ML SOLN COMPARISON:  CT 05/19/2019 FINDINGS: Cardiovascular: Satisfactory  opacification of the pulmonary arteries. No evidence of filling defect to the lobar branch level. Evaluation is somewhat limited by respiratory motion artifact and extensive streak and beam hardening artifact related to patient's pacemaker. Mid ascending thoracic aorta measures 5.0 cm in diameter (previously 4.8 cm on 05/19/2019). There is tortuosity of the descending thoracic aorta. Atherosclerotic calcification of the aorta and coronary arteries. Moderate four-chamber cardiomegaly. Status post CABG. Mediastinum/Nodes: There are a few mildly prominent mediastinal lymph nodes including index upper right paratracheal node measuring 12 mm (series 6, image 25), previously measured 9 mm. No axillary or hilar lymphadenopathy. Trachea, esophagus, and thyroid gland appear within normal limits. Lungs/Pleura: Moderate bilateral pleural effusions with associated compressive atelectasis. Patchy airspace opacities within the dependent portions of the bilateral lower lobes, right middle lobe, and lingula suspicious for superimposed pneumonia or aspiration. No pneumothorax. Upper Abdomen: Reflux of contrast into the IVC and hepatic veins. Left hepatic lobe cyst. Musculoskeletal: No chest wall abnormality. No acute or significant osseous findings. Review of the MIP images confirms the above findings. IMPRESSION: 1. Negative for pulmonary embolism to the lobar branch level. 2. Moderate bilateral pleural effusions with associated compressive atelectasis. 3. Patchy airspace opacities within the dependent portions of the bilateral lower lobes, right middle lobe, and lingula suspicious for superimposed pneumonia or aspiration. 4. Mid ascending thoracic aortic aneurysm measuring up to 5.0 cm in diameter (previously 4.8 cm on 05/19/2019). Recommend semi-annual imaging followup by CTA or MRA and referral to cardiothoracic surgery if not already obtained. This recommendation follows 2010 ACCF/AHA/AATS/ACR/ASA/SCA/SCAI/SIR/STS/SVM  Guidelines for the Diagnosis and Management of Patients With Thoracic Aortic Disease. Circulation. 2010; 121: U765-Y650. Aortic aneurysm NOS (ICD10-I71.9) 5. Cardiomegaly with evidence of right heart dysfunction. 6. Aortic and coronary artery atherosclerosis.  (ICD10-I70.0). Electronically Signed   By: Davina Poke D.O.   On: 01/26/2020 13:56    Cardiac Studies     Patient Profile     85 year old gentleman with complicated prior cardiac history including coronary disease, CABG, PCI to vein graft to OM 3546, chronic diastolic and systolic CHF, ICD, permanent atrial fibrillation not on anticoagulation secondary to GI bleeding/anemia/falls, complete heart block, presenting after recent discharge from the hospital for GI bleed, noted to have hypoxia, falls  Assessment & Plan    Acute on chronic diastolic and systolic respiratory failure  multifactorial : profound anemia hemoglobin 7, pulmonary edema on x-ray with (chronic) moderate pleural effusions (seen on x-ray June 2021, CT scan January 14, 9766 ) / diastolic CHF,  permanent atrial fibrillation, effusions exacerbated by protein calorie malnutrition, albumin 2.2 --Consider additional units of packed red blood cell hemoglobin goal 10 --Consider thoracentesis bilaterally (can send for cytology) Effusions persistent dating back to at least June 2021, documented again on CT scan November 1.   ---on echo, EF 40%, moderately elevated RVSP Lasix up to 40 IV BID -Aggressive nutrition  Demand ischemia/elevated troponin In the setting of known disease, hypoxia on arrival, with underlying anemia Not having anginal symptoms -He is not a candidate for ischemic work-up given recent GI bleed  Nonsustained VT Noted on telemetry Underlying severe coronary disease, bypass, -Anemia will be a cardiac stressor, consider hemoglobin goal 9-10 -For recurrent frequent episodes of VT may need to start amiodarone  Anemia Consider repeat  transfusion Recent GI bleed, We will need to monitor for further bleeding -Anticoagulation for A. fib held a year  Permanent atrial fibrillation Number anticoagulation secondary to anemia and recent GI bleeding, falls Rate well controlled  Hypotension Currently on midodrine Appears he is on this as an outpatient 5 mg 3 times daily Appears predominantly bedbound  Dilated aorta Medical management, not a surgical candidate No restrictions on activity   Total encounter time more than 25 minutes  Greater than 50% was spent in counseling and coordination of care with the patient   For questions or updates, please contact [facility] Please consult [email or web address] for contact info under        Signed, Ida Rogue, MD  01/28/2020, 1:11 PM

## 2020-01-28 NOTE — Plan of Care (Signed)
  Problem: Clinical Measurements: Goal: Ability to maintain clinical measurements within normal limits will improve Outcome: Progressing Goal: Respiratory complications will improve Outcome: Progressing Goal: Cardiovascular complication will be avoided Outcome: Progressing   Problem: Elimination: Goal: Will not experience complications related to bowel motility Outcome: Progressing Goal: Will not experience complications related to urinary retention Outcome: Progressing   Problem: Pain Managment: Goal: General experience of comfort will improve Outcome: Progressing   Problem: Safety: Goal: Ability to remain free from injury will improve Outcome: Progressing   Problem: Skin Integrity: Goal: Risk for impaired skin integrity will decrease Outcome: Progressing   Problem: Clinical Measurements: Goal: Ability to maintain a body temperature in the normal range will improve Outcome: Progressing   Problem: Respiratory: Goal: Ability to maintain adequate ventilation will improve Outcome: Progressing Goal: Ability to maintain a clear airway will improve Outcome: Progressing

## 2020-01-28 NOTE — Progress Notes (Signed)
Assessment and education provided via ASL interpretor Rolan Bucco [phone]. No reported concerns at this time.

## 2020-01-29 ENCOUNTER — Inpatient Hospital Stay: Payer: PPO

## 2020-01-29 DIAGNOSIS — I4821 Permanent atrial fibrillation: Secondary | ICD-10-CM | POA: Diagnosis not present

## 2020-01-29 DIAGNOSIS — R55 Syncope and collapse: Secondary | ICD-10-CM | POA: Diagnosis not present

## 2020-01-29 DIAGNOSIS — J189 Pneumonia, unspecified organism: Secondary | ICD-10-CM | POA: Diagnosis not present

## 2020-01-29 DIAGNOSIS — J9601 Acute respiratory failure with hypoxia: Secondary | ICD-10-CM | POA: Diagnosis not present

## 2020-01-29 DIAGNOSIS — A419 Sepsis, unspecified organism: Secondary | ICD-10-CM | POA: Diagnosis not present

## 2020-01-29 LAB — CBC
HCT: 26.6 % — ABNORMAL LOW (ref 39.0–52.0)
Hemoglobin: 8.3 g/dL — ABNORMAL LOW (ref 13.0–17.0)
MCH: 26.6 pg (ref 26.0–34.0)
MCHC: 31.2 g/dL (ref 30.0–36.0)
MCV: 85.3 fL (ref 80.0–100.0)
Platelets: 202 10*3/uL (ref 150–400)
RBC: 3.12 MIL/uL — ABNORMAL LOW (ref 4.22–5.81)
RDW: 18.4 % — ABNORMAL HIGH (ref 11.5–15.5)
WBC: 6.1 10*3/uL (ref 4.0–10.5)
nRBC: 0 % (ref 0.0–0.2)

## 2020-01-29 LAB — RENAL FUNCTION PANEL
Albumin: 2.2 g/dL — ABNORMAL LOW (ref 3.5–5.0)
Anion gap: 9 (ref 5–15)
BUN: 18 mg/dL (ref 8–23)
CO2: 26 mmol/L (ref 22–32)
Calcium: 8 mg/dL — ABNORMAL LOW (ref 8.9–10.3)
Chloride: 103 mmol/L (ref 98–111)
Creatinine, Ser: 1.09 mg/dL (ref 0.61–1.24)
GFR, Estimated: >60 mL/min (ref >60)
Glucose, Bld: 86 mg/dL (ref 70–99)
Phosphorus: 3.1 mg/dL (ref 2.5–4.6)
Potassium: 3.3 mmol/L — ABNORMAL LOW (ref 3.5–5.1)
Sodium: 138 mmol/L (ref 135–145)

## 2020-01-29 LAB — PREPARE RBC (CROSSMATCH)

## 2020-01-29 LAB — LIPID PANEL
Cholesterol: 89 mg/dL (ref 0–200)
HDL: 29 mg/dL — ABNORMAL LOW (ref >40)
LDL Cholesterol: 48 mg/dL (ref 0–99)
Total CHOL/HDL Ratio: 3.1 RATIO
Triglycerides: 61 mg/dL (ref <150)
VLDL: 12 mg/dL (ref 0–40)

## 2020-01-29 LAB — MAGNESIUM: Magnesium: 1.9 mg/dL (ref 1.7–2.4)

## 2020-01-29 LAB — LACTATE DEHYDROGENASE, PLEURAL OR PERITONEAL FLUID: LD, Fluid: 114 U/L — ABNORMAL HIGH (ref 3–23)

## 2020-01-29 LAB — BODY FLUID CELL COUNT WITH DIFFERENTIAL
Eos, Fluid: 0 %
Lymphs, Fluid: 1 %
Monocyte-Macrophage-Serous Fluid: 8 %
Neutrophil Count, Fluid: 91 %
Total Nucleated Cell Count, Fluid: 747 cu mm

## 2020-01-29 LAB — CULTURE, RESPIRATORY W GRAM STAIN: Culture: NORMAL

## 2020-01-29 LAB — GLUCOSE, PLEURAL OR PERITONEAL FLUID: Glucose, Fluid: 100 mg/dL

## 2020-01-29 LAB — HEMOGLOBIN AND HEMATOCRIT, BLOOD
HCT: 30 % — ABNORMAL LOW (ref 39.0–52.0)
Hemoglobin: 9.6 g/dL — ABNORMAL LOW (ref 13.0–17.0)

## 2020-01-29 LAB — PROTEIN, PLEURAL OR PERITONEAL FLUID: Total protein, fluid: <3 g/dL

## 2020-01-29 MED ORDER — POTASSIUM CHLORIDE CRYS ER 20 MEQ PO TBCR
40.0000 meq | EXTENDED_RELEASE_TABLET | ORAL | Status: AC
  Administered 2020-01-29 (×2): 40 meq via ORAL
  Filled 2020-01-29 (×2): qty 2

## 2020-01-29 MED ORDER — SODIUM CHLORIDE 0.9% IV SOLUTION: Freq: Once | INTRAVENOUS | Status: AC

## 2020-01-29 NOTE — Care Management Important Message (Signed)
Important Message  Patient Details  Name: Thomas [name] MRN: [id number] Date of Birth: May 14, 1934   Medicare Important Message Given:  Yes     Dannette Barbara 01/29/2020, 11:21 AM

## 2020-01-29 NOTE — Progress Notes (Signed)
Progress Note  Patient Name: Thomas [name] Date of Encounter: 01/29/2020  Primary Cardiologist: Sanda Klein, MD   Subjective   No chest pain. No shortness of breath - breathing stable.  States that he is ready to go home. Informed him that this is up to his internal medicine doctor. Per nursing staff, he will get PRBC today.  Inpatient Medications    Scheduled Meds: . sodium chloride   Intravenous Once  . docusate sodium  200 mg Oral Daily  . furosemide  40 mg Intravenous BID  . guaiFENesin  600 mg Oral BID  . ipratropium-albuterol  3 mL Nebulization TID  . midodrine  10 mg Oral TID WC  . pantoprazole  40 mg Oral BID  . potassium chloride  40 mEq Oral Q4H  . simvastatin  20 mg Oral Daily  . sodium chloride flush  3 mL Intravenous Q12H   Continuous Infusions: . sodium chloride    . azithromycin 500 mg (01/28/20 1458)  . cefTRIAXone (ROCEPHIN)  IV 2 g (01/28/20 1413)   PRN Meds: sodium chloride, acetaminophen **OR** acetaminophen, bisacodyl, guaiFENesin-dextromethorphan, nitroGLYCERIN, ondansetron **OR** ondansetron (ZOFRAN) IV, polyethylene glycol, sodium chloride flush   Vital Signs    Vitals:   01/28/20 1613 01/28/20 1947 01/28/20 1951 01/29/20 0341  BP: 98/66 108/65  108/65  Pulse: 70 70  70  Resp: 16 16    Temp: 98.4 F (36.9 C) 98.8 F (37.1 C)  99.1 F (37.3 C)  TempSrc: Oral Oral  Oral  SpO2: 93% 90% 93% 91%  Weight:    80.2 kg  Height:        Intake/Output Summary (Last 24 hours) at 01/29/2020 0818 Last data filed at 01/29/2020 0545 Gross per 24 hour  Intake 1355.83 ml  Output 3350 ml  Net -1994.17 ml   Last 3 Weights 01/29/2020 01/28/2020 01/27/2020  Weight (lbs) 176 lb 12.8 oz 176 lb 2 oz 177 lb 4 oz  Weight (kg) 80.196 kg 79.89 kg 80.4 kg      Telemetry    Atrial fibrillation, paced, ventricular rate 70-80s, PVCs, NSVT up to 11 beats - Personally Reviewed  ECG    No new tracings - Personally Reviewed  Physical Exam   GEN:  No acute distress.  Receiving AM medications. Frail and elderly gentleman. Neck: No JVD Cardiac: IRIR with 3-5/0 systolic murmur best appreciated at LLSB /  approximately the 5th ICS / lateral auscultation. No rubs or gallops.  Respiratory: Coarse breath sounds bilaterally, bilateral crackles and wheezing. GI: Soft, nontender, non-distended  MS: No pitting edema, SCDs in place; No deformity. Neuro:  Nonfocal. Deaf. Psych: Normal affect    Labs    High Sensitivity Troponin:   Recent Labs  Lab 01/26/20 1205 01/26/20 1500 01/26/20 1840 01/27/20 0017 01/27/20 0730  TROPONINIHS 83* 168* 192* 184* 118*      Chemistry Recent Labs  Lab 01/26/20 0946 01/26/20 0946 01/27/20 0730 01/28/20 0443 01/29/20 0519  NA 140   < > 139 134* 138  K 3.7   < > 3.9 3.9 3.3*  CL 105   < > 105 100 103  CO2 22   < > 27 24 26   GLUCOSE 153*   < > 117* 114* 86  BUN 14   < > 18 22 18   CREATININE 1.20   < > 1.26* 1.19 1.09  CALCIUM 8.6*   < > 8.2* 8.0* 8.0*  PROT 5.6*  --  5.2*  --   --  ALBUMIN 2.6*   < > 2.2* 2.2* 2.2*  AST 35  --  24  --   --   ALT 16  --  13  --   --   ALKPHOS 139*  --  101  --   --   BILITOT 0.9  --  0.8  --   --   GFRNONAA >60   < > 57* >60 >60  ANIONGAP 13   < > 7 10 9    < > = values in this interval not displayed.     Hematology Recent Labs  Lab 01/28/20 0445 01/28/20 1605 01/29/20 0519  WBC 6.6 6.8 6.1  RBC 2.84* 3.05* 3.12*  HGB 7.6* 8.3* 8.3*  HCT 24.4* 26.1* 26.6*  MCV 85.9 85.6 85.3  MCH 26.8 27.2 26.6  MCHC 31.1 31.8 31.2  RDW 18.6* 18.2* 18.4*  PLT 211 204 202    BNP Recent Labs  Lab 01/26/20 0946  BNP 266.7*     DDimer No results for input(s): DDIMER in the last 168 hours.   Radiology    ECHOCARDIOGRAM COMPLETE  Result Date: 01/28/2020    ECHOCARDIOGRAM REPORT   Patient Name:   Thomas [name] Date of Exam: 01/28/2020 Medical Rec #:  [id number]      Height:       70.0 in Accession #:    [id number]     Weight:       176.1 lb Date of Birth:   1934-03-21     BSA:          1.978 m Patient Age:    85 years       BP:           100/50 mmHg Patient Gender: M              HR:           73 bpm. Exam Location:  [facility] Procedure: 2D Echo, Cardiac Doppler and Color Doppler Indications:     R06.02 SOB; I48.2 Chronic atrial fibrillation; I25.110                  Atherosclerotic heart disease of native coronary artery with                  unstable angina pectoris; I50.33 Acute on chronic diastolic                  (congestive) heart failure  History:         Patient has prior history of Echocardiogram examinations. CHF                  and LV DysFx, s/p Cardiac Surgery, CAD, Arrythmias:Atrial                  Fibrillation; Signs/Symptoms:Shortness of Breath.  Sonographer:     Arville Go Referring Phys:  [place] Phys: Ida Rogue MD IMPRESSIONS  1. Left ventricular ejection fraction, by estimation, is 40 to 45%. The left ventricle has mildly decreased function. The left ventricle demonstrates regional wall motion abnormalities (Challenging images, though there is concern for inferior/posterior hypokinesis). The left ventricular internal cavity size was moderately dilated. Left ventricular diastolic parameters are indeterminate.  2. Right ventricular systolic function is normal. The right ventricular size is moderately enlarged. There is moderately elevated pulmonary artery systolic pressure. The estimated right ventricular systolic pressure is 93.7 mmHg.  3. Left atrial size was moderately dilated.  4. The mitral valve  is normal in structure. Moderate mitral valve regurgitation.  5. Tricuspid valve regurgitation is moderate to severe.  6. Aortic valve regurgitation is mild.  7. The inferior vena cava is dilated in size with <50% respiratory variability, suggesting right atrial pressure of 15 mmHg.  8. Large left pleural effusion noted FINDINGS  Left Ventricle: Left ventricular ejection fraction, by estimation, is 40 to 45%. The left  ventricle has mildly decreased function. The left ventricle demonstrates regional wall motion abnormalities. The left ventricular internal cavity size was moderately dilated. There is no left ventricular hypertrophy. Left ventricular diastolic parameters are indeterminate. Right Ventricle: The right ventricular size is moderately enlarged. No increase in right ventricular wall thickness. Right ventricular systolic function is normal. There is moderately elevated pulmonary artery systolic pressure. The tricuspid regurgitant  velocity is 3.05 m/s, and with an assumed right atrial pressure of 20 mmHg, the estimated right ventricular systolic pressure is 16.1 mmHg. Left Atrium: Left atrial size was moderately dilated. Right Atrium: Right atrial size was normal in size. Pericardium: There is no evidence of pericardial effusion. Mitral Valve: The mitral valve is normal in structure. Moderate mitral valve regurgitation, with eccentric laterally directed jet. No evidence of mitral valve stenosis. Tricuspid Valve: The tricuspid valve is normal in structure. Tricuspid valve regurgitation is moderate to severe. No evidence of tricuspid stenosis. Aortic Valve: The aortic valve was not well visualized. Aortic valve regurgitation is mild. No aortic stenosis is present. Aortic valve peak gradient measures 9.5 mmHg. Pulmonic Valve: The pulmonic valve was normal in structure. Pulmonic valve regurgitation is not visualized. No evidence of pulmonic stenosis. Aorta: The aortic root is normal in size and structure. Venous: The inferior vena cava is dilated in size with less than 50% respiratory variability, suggesting right atrial pressure of 15 mmHg. IAS/Shunts: No atrial level shunt detected by color flow Doppler. Additional Comments: A pacer wire is visualized.  LEFT VENTRICLE PLAX 2D LVIDd:         6.46 cm      Diastology LVIDs:         5.14 cm      LV e' medial:    8.27 cm/s LV PW:         1.13 cm      LV E/e' medial:  16.4 LV IVS:         1.02 cm      LV e' lateral:   9.68 cm/s LVOT diam:     2.20 cm      LV E/e' lateral: 14.0 LV SV:         92 LV SV Index:   47 LVOT Area:     3.80 cm  LV Volumes (MOD) LV vol d, MOD A2C: 218.0 ml LV vol s, MOD A2C: 106.0 ml LV SV MOD A2C:     112.0 ml RIGHT VENTRICLE RV Basal diam:  4.54 cm RV S prime:     10.30 cm/s TAPSE (M-mode): 2.5 cm LEFT ATRIUM              Index        RIGHT ATRIUM           Index LA diam:        6.00 cm  3.03 cm/m   RA Area:     39.50 cm LA Vol (A2C):   205.0 ml 103.66 ml/m RA Volume:   165.00 ml 83.44 ml/m LA Vol (A4C):   208.0 ml 105.18 ml/m LA Biplane Vol: 228.0 ml 115.29  ml/m  AORTIC VALVE                PULMONIC VALVE AV Area (Vmax): 3.58 cm    PV Vmax:       1.11 m/s AV Vmax:        154.00 cm/s PV Peak grad:  4.9 mmHg AV Peak Grad:   9.5 mmHg LVOT Vmax:      145.00 cm/s LVOT Vmean:     95.200 cm/s LVOT VTI:       0.243 m  AORTA Ao Root diam: 3.80 cm Ao Asc diam:  3.60 cm MITRAL VALVE                TRICUSPID VALVE MV Area (PHT): 4.31 cm     TV Peak grad:   34.0 mmHg MV Decel Time: 176 msec     TV Vmax:        2.92 m/s MV E velocity: 136.00 cm/s  TR Peak grad:   37.2 mmHg                             TR Vmax:        305.00 cm/s                              SHUNTS                             Systemic VTI:  0.24 m                             Systemic Diam: 2.20 cm Ida Rogue MD Electronically signed by Ida Rogue MD Signature Date/Time: 01/28/2020/4:02:34 PM    Final     Cardiac Studies   ECHO06/11/2019:  1. Left ventricular ejection fraction, by estimation, is 55 to 60%. The  left ventricle has normal function. The left ventricle has no regional  wall motion abnormalities.  2. Right ventricular systolic function is normal. The right ventricular  size is normal.  3. Left atrial size was mild to moderately dilated. No left atrial/left  atrial appendage thrombus was detected.  4. Right atrial size was mildly dilated.  5. The mitral valve is normal  in structure. Mild mitral valve  regurgitation.  6. Tricuspid valve regurgitation is moderate to severe.  7. The aortic valve is tricuspid. Aortic valve regurgitation is mild.  Echo 01/28/20 1. Left ventricular ejection fraction, by estimation, is 40 to 45%. The  left ventricle has mildly decreased function. The left ventricle  demonstrates regional wall motion abnormalities (Challenging images,  though there is concern for inferior/posterior  hypokinesis). The left ventricular internal cavity size was moderately  dilated. Left ventricular diastolic parameters are indeterminate.  2. Right ventricular systolic function is normal. The right ventricular  size is moderately enlarged. There is moderately elevated pulmonary artery  systolic pressure. The estimated right ventricular systolic pressure is  02.6 mmHg.  3. Left atrial size was moderately dilated.  4. The mitral valve is normal in structure. Moderate mitral valve  regurgitation.  5. Tricuspid valve regurgitation is moderate to severe.  6. Aortic valve regurgitation is mild.  7. The inferior vena cava is dilated in size with <50% respiratory  variability, suggesting right atrial pressure of 15 mmHg.  8. Large left pleural effusion noted  Patient Profile     85 y.o. male with history of CAD s/p remote CABG with subsequent PCI/BMS to the SVG to OM 2015 with known CTO of the SVG to RCA and patent LIMA to LAD, chronic combined systolic and diastolic CHFwith recovery of EF by most recent echo, ICM, [employer] CRT-D implanted in 2014, permanent Afib not on Okay secondary to underlying microcytic anemia with known AVMs as well as falls, CHB, severe TR, prior GIB requiring PRBC transfusion with subsequent discovery of aforementioned AVMs, CKDIII, prostate CA, HTN, HLD, and deafness, who is being seen for the evaluation of elevated HS Tn.  Assessment & Plan    Elevated high-sensitivity troponin History of CAD s/p CABG with PCI    [place] Ischemia suspected --Noangina reported. CTA negative for PE. --Presented with weakness and fall after recent admission with GI bleed. --High-sensitivity troponin minimally elevated and flat trending. --EKG without acute ST/T changes. --Echo with EF 40-45%, reduced from previous 55-60%. --Known history of underlying CAD s/p CABG with PCI. --Consider also reduced EF 2/2 NSVT. Maintain electrolytes at goal. --Not an ideal catheterization candidate / candidate for further ischemic workup in the setting of his comorbid conditions, including recent GIB. No plan for invasive ischemic work-up this admission. No DAPT in the setting of recurrent GIB.Continue medical management as BP, H&H, and renal function allows. Replete electrolytes, given has been hypokalemic for most of admission. K goal 4.0. Mg goal 2.0.  Permanent Afib not on OAC --Rate controlled. Not on current BB.  --Not on [place] 2/2 previous GIB and known AVMs, as well as frequent falls. --Not on [facility] prior to admission. --CHA2DS2VASc score of at least5 (CHF, HTN, age1, vascular).  NSVT --Noted on telemetry. --Likely exacerbated by GIB / hypokalemia with K 3.3. --Known underlying CAD s/p bypass with severe anemia in the setting of GIB. --Consider NSVT as contributing to reduced EF. K goal 4.0. Mg goal 2.0. --As previously noted, for recurrent VT, may need initiation of amiodarone. --Would first attempt to maintain electrolytes at goal. --Consider a Zio monitor at discharge as well to quantify the burden of NSVT.  Chronic combined systolic and diastolic CHF/ICMwith recovery of EF S/p CRT-D with reimplantation 08/2019 --No current SOB. --Presented with earlier shortness of breath as outlined above and in the setting of recent GI bleed.  Consider breathing issues as 2/2 GIB.  Recommend transfusion for stable H&H. --Maintain electrolytes at goal. --CTA negative for PE. --Reduced EF as above but poor candidate for further  invasive ischemic workup at this time. Could consider as an outpatient and once recovered from GIB if stable at that time.  --S/p reimplantation of CRT-D 08/2019 after an infection as above.  --Volume up over the weekend, improving volume status this AM on exam. --Continue IV lasix 40mg  BID. --Monitor I's/O's. Net -2.2L yesterday. --Daily weights. Wt 79.9kg   80.2kg. --Daily BMET. Cr 1.19  1.09 with BUN 22  18. --CHF education. ReDS vest. --Will need transition to oral diuretic before discharge.  --Continue medical management.  Frequent Falls, Syncope Weakness --History of frequent falls. Consider weakness as 2/2 his recent GIB as above with history of frequent falls.Also considered is possible pna started on empiric abx.      CKDIII --Daily BMET.  Hypokalemia, hypomagnesemia   --K remains low and likely contributing to NSVT. Replete with goal 4.0. Check Mg with goal2.0.  HTN --Currently BP well controlled to hypotensive on midodrine. Ideally, would wean off of midodrine. Continue PTA medications as BP / renal  functionallows.  Aortic dilation as seen on CT --As seen on CT, 4.8cm  5.0cm. Continue to monitor with periodic imaging. Per report, vascular surgery consult also recommended per guidelines. Continue medical management and aggressive risk factor modification. No ASA in the setting of GIB but can continue statin. Recommend high intensity statin or transition from current statin to atorvastatin 80mg  daily and LDL goal below 70.  Anemia with known AVM; melena --Recent GIB s/p ablation. Consider repeat transfusion to maintain stable H&H.  For questions or updates, please contact [place] Please consult [email or web address] for contact info under        Signed, [name] Chaco, PA-C  01/29/2020, 8:18 AM

## 2020-01-29 NOTE — Plan of Care (Signed)

## 2020-01-29 NOTE — Progress Notes (Signed)
PROGRESS NOTE  Thomas [name] MCN:[id number] DOB: 1934/11/11   PCP: [name], MD  Patient is from: home.  Lives with his wife.  Uses walker at baseline.  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 fib not on AC due to GI bleed, systolic CHF/second-degree AVB s/p BiV AICD, CVA without residual deficit, GIB with duodenal AVM and treated prostate cancer brought to ED by EMS after syncopal episode at home.  Reportedly started feeling short of breath in the morning. It seems he went to take shower and found down between the toilet commode and the bus stop.  Patient denies hitting his head or LOC.  When EMS arrived, hypoxic to 47% on RA, and recovered to 91% on CPAP.  In ED, slightly tachycardic to 100s.  Tachypneic to 30s.  BP 118/57.  95% on BiPAP.  WBC 5.4.  Hgb 8.6 (higher than baseline).  ABG normal.  BNP 266 (lower than baseline).  Troponin 35> 83.  COVID-19, influenza and RSV PCR negative.  UA without significant finding.  EKG paced rhythm.  CXR concerning for CHF.  Procalcitonin elevated.  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   Admitted for acute respiratory failure, sepsis, pneumonia and possible CHF. Started on ceftriaxone, azithromycin and IV Lasix.  Cardiology consulted and following.   Subjective: Seen and examined earlier this morning with the help of sign language interpreter over an iPad.  No major events overnight or this morning.  No complaints.  Denies chest pain, shortness of breath, lightheadedness, GI or UTI symptoms.  Objective: Vitals:   01/29/20 0341 01/29/20 0734 01/29/20 0822 01/29/20 1143  BP: 108/65  127/66  113/61  Pulse: 70  73 70  Resp:   18 19  Temp: 99.1 F (37.3 C)  97.9 F (36.6 C) 98.5 F (36.9 C)  TempSrc: Oral   Oral  SpO2: 91% 90% 90% 92%  Weight: 80.2 kg     Height:        Intake/Output Summary (Last 24 hours) at 01/29/2020 1147 Last data filed at 01/29/2020 1144 Gross per 24 hour  Intake 920.83 ml  Output 3550 ml  Net -2629.17 ml   Filed Weights   01/27/20 0500 01/28/20 0322 01/29/20 0341  Weight: 80.4 kg 79.9 kg 80.2 kg    Examination:  GENERAL: No apparent distress.  Nontoxic. HEENT: MMM.  Vision grossly intact. NECK: Supple.  No apparent JVD.  RESP: On RA.  No IWOB.  Fair aeration bilaterally. CVS:  RRR. Heart sounds normal.  ABD/GI/GU: BS+. Abd soft, NTND.  MSK/EXT:  Moves extremities. No apparent deformity.  Trace edema bilaterally. SKIN: no apparent skin lesion or wound NEURO: Awake, alert and oriented appropriately.  No apparent focal neuro deficit. PSYCH: Calm. Normal affect.  Procedures:  None  Microbiology summarized: [id number], influenza and RSV PCR nonreactive. Respiratory cultures with normal flora.  Assessment & Plan: Acute hypoxemic respiratory failure-multifactorial including sepsis, community-acquired pneumonia, pleural effusion, CHF exacerbation and anemia.  Reportedly desaturated to 47% when EMS arrived.  Started on BiPAP.  Currently on room air.  Edema improved. -Treat treatable causes as below.  -Chest x-ray to reassess pleural effusion.  Severe sepsis due to community-acquired pneumonia-POA.  Concerned about aspiration pneumonia.  Met  criteria for severe sepsis with tachycardia, pneumonia and respiratory failure with hypoxia requiring BiPAP.  Procalcitonin elevated.  Respiratory culture with normal flora. Sepsis physiology resolving. -Continue ceftriaxone and azithromycin to complete 5 days course -Aspiration precautions.  Appreciate input by SLP. -Encourage incentive spirometry and flutter valve -Mucolytic's and  antitussives.  Acute on chronic systolic CHF with recovered EF s/p CRT-D in 08/2019: TTE with EF of 40 to 45% (55 to 60% on TEE in 08/2019), RWMA, indeterminate DD, RVSP of 57.2, moderate to severe TVR (unchanged), moderate MVR and moderate LAE and large left pleural effusion. Has dyspnea and evidence of pulmonary congestion on CXR and CT but BNP lower than baseline.  About 3.6 L UOP/24 hours.  Creatinine improved. -Cardiology following. -Continue IV Lasix 40 mg daily -Continue holding Aldactone losartan in the setting of soft blood pressures -Monitor fluid status, renal function and electrolytes -Check two-view chest x-ray to assess pleural effusion  Moderate bilateral pleural effusion: Likely due to CHF but difficult to exclude parapneumonic effusion.   -Two-view chest x-ray to reassess pleural effusion. -Thoracocentesis if significant pleural effusion.  Syncopal fall: Multiple potential etiologies including sepsis, respiratory failure, anemia and arrhythmia.  No head impact or loss of consciousness.  TTE as above. -Treat sepsis as above. -Transfusing for anemia. -Continue telemetry monitoring -PT/OT eval. -Cardiology following.  Nonsustained V. tach: Patient had 18 beats of V. tach on 11/13.  He felt brief flutters, dizziness and shortness of breath.  Currently asymptomatic. -Defer to cardiology. -Optimize electrolytes.  Chronic hypotension: On midodrine at home.  Hypotension resolved. -Continue home midodrine  Elevated troponin: High-sensitivity troponin 35>> 192 (peak)> 184.  Likely demand ischemia in the setting of hypoxia and sepsis.[name]  EKG without acute ischemic finding.  CTA negative for PE.  Patient without chest pain.  TTE as above. -Appreciate input by cardiology  History of  CAD s/p CABG and  multiple PCI: Elevated troponin but no chest pain.  EKG without acute ischemic finding. -Continue home medications  Second-degree AV block s/p AICD Permanent atrial fibrillation: Rate  controlled without medication..  Not on AC due to GI bleed.  Anemia of chronic disease and chronic GI bleed: Hgb 8.6 on admit> 6.7>2u> 8.3>1u.  Baseline 7.0-8.0.  Denies melena or hematochezia. -Continue holding aspirin and Lovenox. -SCD for VTE prophylaxis -Transfuse a unit of blood.  Cardiology recommended Hgb goal > 9.0. -Continue p.o. Protonix  Legally deaf, communicates with sign language. -Use language iPad   Debility/physical deconditioning: Uses walker at baseline. -PT/OT eval  Constipation: Has not a bowel movement in 2 days. -Bowel regimen.  Hypokalemia: K3.3. -K-Dur 40 mEq x 2  Body mass index is 25.37 kg/m.        Pressure injury: Pressure Injury 06/21/19 Buttocks Mid Stage 2 -  Partial thickness loss of dermis presenting as a shallow open injury with a red, pink wound bed without slough. (Active)  06/21/19 0700  Location: Buttocks  Location Orientation: Mid  Staging: Stage 2 -  Partial thickness loss of dermis presenting as a shallow open injury with a red, pink wound bed without slough.  Wound Description (Comments):   Present on Admission:    DVT prophylaxis:  Place and maintain sequential compression device Start: 01/27/20 1009 SCDs Start: 01/26/20 1757  Code Status: Full code Family Communication: Updated patient's wife using sign language interpreter Status is: Inpatient  Remains inpatient appropriate because:Ongoing diagnostic testing needed not appropriate for outpatient work up, IV treatments appropriate due to intensity of illness or inability to  take PO and Inpatient level of care appropriate due to severity of illness   Dispo: The patient is from: Home              Anticipated d/c is to: Home              Anticipated d/c date is: 2 days              Patient currently is not medically stable to d/c.       Consultants:  Cardiology   Sch Meds:  Scheduled Meds: . sodium chloride   Intravenous Once  . docusate sodium  200 mg Oral  Daily  . furosemide  40 mg Intravenous BID  . guaiFENesin  600 mg Oral BID  . ipratropium-albuterol  3 mL Nebulization TID  . midodrine  10 mg Oral TID WC  . pantoprazole  40 mg Oral BID  . potassium chloride  40 mEq Oral Q4H  . simvastatin  20 mg Oral Daily  . sodium chloride flush  3 mL Intravenous Q12H   Continuous Infusions: . sodium chloride    . azithromycin 500 mg (01/28/20 1458)  . cefTRIAXone (ROCEPHIN)  IV 2 g (01/28/20 1413)   PRN Meds:.sodium chloride, acetaminophen **OR** acetaminophen, bisacodyl, guaiFENesin-dextromethorphan, nitroGLYCERIN, ondansetron **OR** ondansetron (ZOFRAN) IV, polyethylene glycol, sodium chloride flush  Antimicrobials: Anti-infectives (From admission, onward)   Start     Dose/Rate Route Frequency Ordered Stop   01/27/20 1430  cefTRIAXone (ROCEPHIN) 2 g in sodium chloride 0.9 % 100 mL IVPB        2 g 200 mL/hr over 30 Minutes Intravenous Every 24 hours 01/27/20 0803     01/26/20 1430  cefTRIAXone (ROCEPHIN) 1 g in sodium chloride 0.9 % 100 mL IVPB  Status:  Discontinued        1 g 200 mL/hr over 30 Minutes Intravenous Every 24 hours 01/26/20 1422 01/27/20 0803   01/26/20 1430  azithromycin (ZITHROMAX) 500 mg in sodium chloride 0.9 % 250 mL IVPB        500 mg 250 mL/hr over 60 Minutes Intravenous Every 24 hours 01/26/20 1422         I have personally reviewed the following labs and images: CBC: Recent Labs  Lab 01/26/20 0946 01/26/20 0946 01/27/20 0730 01/27/20 0730 01/27/20 1725 01/28/20 0044 01/28/20 0445 01/28/20 1605 01/29/20 0519  WBC 5.4  --  6.1  --   --   --  6.6 6.8 6.1  NEUTROABS 4.4  --   --   --   --   --   --   --   --   HGB 8.6*   < > 7.1*   < > 6.7* 7.8* 7.6* 8.3* 8.3*  HCT 28.6*   < > 23.3*   < > 21.8* 24.1* 24.4* 26.1* 26.6*  MCV 90.2  --  88.9  --   --   --  85.9 85.6 85.3  PLT 305  --  197  --   --   --  211 204 202   < > = values in this interval not displayed.   BMP &GFR Recent Labs  Lab 01/26/20 0946  01/27/20 0730 01/27/20 1725 01/28/20 0443 01/28/20 0445 01/29/20 0519  NA 140 139  --  134*  --  138  K 3.7 3.9  --  3.9  --  3.3*  CL 105 105  --  100  --  103  CO2 22 27  --  24  --  26  GLUCOSE 153* 117*  --  114*  --  86  BUN 14 18  --  22  --  18  CREATININE 1.20 1.26*  --  1.19  --  1.09  CALCIUM 8.6* 8.2*  --  8.0*  --  8.0*  MG  --   --  1.9  --  1.9 1.9  PHOS  --   --   --  3.4  --  3.1   Estimated Creatinine Clearance: 54 mL/min (by C-G formula based on SCr of 1.09 mg/dL). Liver & Pancreas: Recent Labs  Lab 01/26/20 0946 01/27/20 0730 01/28/20 0443 01/29/20 0519  AST 35 24  --   --   ALT 16 13  --   --   ALKPHOS 139* 101  --   --   BILITOT 0.9 0.8  --   --   PROT 5.6* 5.2*  --   --   ALBUMIN 2.6* 2.2* 2.2* 2.2*   No results for input(s): LIPASE, AMYLASE in the last 168 hours. No results for input(s): AMMONIA in the last 168 hours. Diabetic: Recent Labs    01/26/20 1840  HGBA1C 4.8   No results for input(s): GLUCAP in the last 168 hours. Cardiac Enzymes: No results for input(s): CKTOTAL, CKMB, CKMBINDEX, TROPONINI in the last 168 hours. No results for input(s): PROBNP in the last 8760 hours. Coagulation Profile: No results for input(s): INR, PROTIME in the last 168 hours. Thyroid Function Tests: No results for input(s): TSH, T4TOTAL, FREET4, T3FREE, THYROIDAB in the last 72 hours. Lipid Profile: Recent Labs    01/29/20 0519  CHOL 89  HDL 29*  LDLCALC 48  TRIG 61  CHOLHDL 3.1   Anemia Panel: No results for input(s): VITAMINB12, FOLATE, FERRITIN, TIBC, IRON, RETICCTPCT in the last 72 hours. Urine analysis:    Component Value Date/Time   COLORURINE YELLOW (A) 01/26/2020 0941   APPEARANCEUR CLEAR (A) 01/26/2020 0941   APPEARANCEUR Clear 12/13/2017 0918   LABSPEC 1.017 01/26/2020 0941   LABSPEC 1.021 11/19/2011 1501   PHURINE 5.0 01/26/2020 0941   GLUCOSEU NEGATIVE 01/26/2020 0941   GLUCOSEU Negative 11/19/2011 1501   HGBUR NEGATIVE  01/26/2020 0941   BILIRUBINUR NEGATIVE 01/26/2020 0941   BILIRUBINUR Negative 12/13/2017 0918   BILIRUBINUR Negative 11/19/2011 1501   KETONESUR NEGATIVE 01/26/2020 0941   PROTEINUR NEGATIVE 01/26/2020 0941   NITRITE NEGATIVE 01/26/2020 0941   LEUKOCYTESUR NEGATIVE 01/26/2020 0941   LEUKOCYTESUR Negative 11/19/2011 1501   Sepsis Labs: Invalid input(s): PROCALCITONIN, [place]  Microbiology: Recent Results (from the past 240 hour(s))  Respiratory Panel by RT PCR (Flu A&B, Covid) - Nasopharyngeal Swab     Status: None   Collection Time: 01/26/20  9:46 AM   Specimen: Nasopharyngeal Swab  Result Value Ref Range Status   SARS Coronavirus 2 by RT PCR NEGATIVE NEGATIVE Final    Comment: (NOTE) SARS-CoV-2 target nucleic acids are NOT DETECTED.  The SARS-CoV-2 RNA is generally detectable in upper respiratoy specimens during the acute phase of infection. The lowest concentration of SARS-CoV-2 viral copies this assay can detect is 131 copies/mL. A negative result does not preclude SARS-Cov-2 infection and should not be used as the sole basis for treatment or other patient management decisions. A negative result may occur with  improper specimen collection/handling, submission of specimen other than nasopharyngeal swab, presence of viral mutation(s) within the areas targeted by this assay, and inadequate number of viral copies (<131 copies/mL). A negative result must be combined with clinical observations,  patient history, and epidemiological information. The expected result is Negative.  Fact Sheet for Patients:  PinkCheek.be  Fact Sheet for Healthcare Providers:  GravelBags.it  This test is no t yet approved or cleared by the [place] FDA and  has been authorized for detection and/or diagnosis of SARS-CoV-2 by FDA under an Emergency Use Authorization (EUA). This EUA will remain  in effect (meaning this test can be  used) for the duration of the COVID-19 declaration under Section 564(b)(1) of the Act, 21 U.S.C. section 360bbb-3(b)(1), unless the authorization is terminated or revoked sooner.     Influenza A by PCR NEGATIVE NEGATIVE Final   Influenza B by PCR NEGATIVE NEGATIVE Final    Comment: (NOTE) The Xpert Xpress SARS-CoV-2/FLU/RSV assay is intended as an aid in  the diagnosis of influenza from Nasopharyngeal swab specimens and  should not be used as a sole basis for treatment. Nasal washings and  aspirates are unacceptable for Xpert Xpress SARS-CoV-2/FLU/RSV  testing.  Fact Sheet for Patients: PinkCheek.be  Fact Sheet for Healthcare Providers: GravelBags.it  This test is not yet approved or cleared by the [place] FDA and  has been authorized for detection and/or diagnosis of SARS-CoV-2 by  FDA under an Emergency Use Authorization (EUA). This EUA will remain  in effect (meaning this test can be used) for the duration of the  Covid-19 declaration under Section 564(b)(1) of the Act, 21  U.S.C. section 360bbb-3(b)(1), unless the authorization is  terminated or revoked. Performed at [facility], [address] [place], [place] [address]   Expectorated sputum assessment w rflx to resp cult     Status: None   Collection Time: 01/26/20  2:20 PM   Specimen: Sputum  Result Value Ref Range Status   Specimen Description SPUTUM  Final   Special Requests NONE  Final   Sputum evaluation   Final    THIS SPECIMEN IS ACCEPTABLE FOR SPUTUM CULTURE Performed at [facility], [address] [place], [place] [address]    Report Status 01/26/2020 FINAL  Final  Culture, respiratory     Status: None   Collection Time: 01/26/20  2:20 PM   Specimen: SPU  Result Value Ref Range Status   Specimen Description   Final    SPUTUM Performed at [facility], [address], [place], [place] [address]    Special  Requests   Final    NONE Reflexed from [id number] Performed at [facility], [address] [place], [place] [address]    Gram Stain   Final    ABUNDANT WBC PRESENT, PREDOMINANTLY PMN MODERATE GRAM POSITIVE COCCI RARE GRAM POSITIVE RODS    Culture   Final    MODERATE Normal respiratory flora-no Staph aureus or Pseudomonas seen Performed at [facility], [place] [address], [place], [place] [address]    Report Status 01/29/2020 FINAL  Final    Radiology Studies: ECHOCARDIOGRAM COMPLETE  Result Date: 01/28/2020    ECHOCARDIOGRAM REPORT   Patient Name:   TYMEIR WEATHINGTON Date of Exam: 01/28/2020 Medical Rec #:  [id number]      Height:       70.0 in Accession #:    [id number]     Weight:       176.1 lb Date of Birth:  08-04-1934     BSA:          1.978 m Patient Age:    35 years       BP:  100/50 mmHg Patient Gender: M              HR:           73 bpm. Exam Location:  [facility] Procedure: 2D Echo, Cardiac Doppler and Color Doppler Indications:     R06.02 SOB; I48.2 Chronic atrial fibrillation; I25.110                  Atherosclerotic heart disease of native coronary artery with                  unstable angina pectoris; I50.33 Acute on chronic diastolic                  (congestive) heart failure  History:         Patient has prior history of Echocardiogram examinations. CHF                  and LV DysFx, s/p Cardiac Surgery, CAD, Arrythmias:Atrial                  Fibrillation; Signs/Symptoms:Shortness of Breath.  Sonographer:     Arville Go Referring Phys:  [place] Phys: Ida Rogue MD IMPRESSIONS  1. Left ventricular ejection fraction, by estimation, is 40 to 45%. The left ventricle has mildly decreased function. The left ventricle demonstrates regional wall motion abnormalities (Challenging images, though there is concern for inferior/posterior hypokinesis). The left ventricular internal cavity size was moderately dilated. Left ventricular diastolic  parameters are indeterminate.  2. Right ventricular systolic function is normal. The right ventricular size is moderately enlarged. There is moderately elevated pulmonary artery systolic pressure. The estimated right ventricular systolic pressure is 09.8 mmHg.  3. Left atrial size was moderately dilated.  4. The mitral valve is normal in structure. Moderate mitral valve regurgitation.  5. Tricuspid valve regurgitation is moderate to severe.  6. Aortic valve regurgitation is mild.  7. The inferior vena cava is dilated in size with <50% respiratory variability, suggesting right atrial pressure of 15 mmHg.  8. Large left pleural effusion noted FINDINGS  Left Ventricle: Left ventricular ejection fraction, by estimation, is 40 to 45%. The left ventricle has mildly decreased function. The left ventricle demonstrates regional wall motion abnormalities. The left ventricular internal cavity size was moderately dilated. There is no left ventricular hypertrophy. Left ventricular diastolic parameters are indeterminate. Right Ventricle: The right ventricular size is moderately enlarged. No increase in right ventricular wall thickness. Right ventricular systolic function is normal. There is moderately elevated pulmonary artery systolic pressure. The tricuspid regurgitant  velocity is 3.05 m/s, and with an assumed right atrial pressure of 20 mmHg, the estimated right ventricular systolic pressure is 11.9 mmHg. Left Atrium: Left atrial size was moderately dilated. Right Atrium: Right atrial size was normal in size. Pericardium: There is no evidence of pericardial effusion. Mitral Valve: The mitral valve is normal in structure. Moderate mitral valve regurgitation, with eccentric laterally directed jet. No evidence of mitral valve stenosis. Tricuspid Valve: The tricuspid valve is normal in structure. Tricuspid valve regurgitation is moderate to severe. No evidence of tricuspid stenosis. Aortic Valve: The aortic valve was not well  visualized. Aortic valve regurgitation is mild. No aortic stenosis is present. Aortic valve peak gradient measures 9.5 mmHg. Pulmonic Valve: The pulmonic valve was normal in structure. Pulmonic valve regurgitation is not visualized. No evidence of pulmonic stenosis. Aorta: The aortic root is normal in size and structure. Venous: The inferior vena cava is dilated  in size with less than 50% respiratory variability, suggesting right atrial pressure of 15 mmHg. IAS/Shunts: No atrial level shunt detected by color flow Doppler. Additional Comments: A pacer wire is visualized.  LEFT VENTRICLE PLAX 2D LVIDd:         6.46 cm      Diastology LVIDs:         5.14 cm      LV e' medial:    8.27 cm/s LV PW:         1.13 cm      LV E/e' medial:  16.4 LV IVS:        1.02 cm      LV e' lateral:   9.68 cm/s LVOT diam:     2.20 cm      LV E/e' lateral: 14.0 LV SV:         92 LV SV Index:   47 LVOT Area:     3.80 cm  LV Volumes (MOD) LV vol d, MOD A2C: 218.0 ml LV vol s, MOD A2C: 106.0 ml LV SV MOD A2C:     112.0 ml RIGHT VENTRICLE RV Basal diam:  4.54 cm RV S prime:     10.30 cm/s TAPSE (M-mode): 2.5 cm LEFT ATRIUM              Index        RIGHT ATRIUM           Index LA diam:        6.00 cm  3.03 cm/m   RA Area:     39.50 cm LA Vol (A2C):   205.0 ml 103.66 ml/m RA Volume:   165.00 ml 83.44 ml/m LA Vol (A4C):   208.0 ml 105.18 ml/m LA Biplane Vol: 228.0 ml 115.29 ml/m  AORTIC VALVE                PULMONIC VALVE AV Area (Vmax): 3.58 cm    PV Vmax:       1.11 m/s AV Vmax:        154.00 cm/s PV Peak grad:  4.9 mmHg AV Peak Grad:   9.5 mmHg LVOT Vmax:      145.00 cm/s LVOT Vmean:     95.200 cm/s LVOT VTI:       0.243 m  AORTA Ao Root diam: 3.80 cm Ao Asc diam:  3.60 cm MITRAL VALVE                TRICUSPID VALVE MV Area (PHT): 4.31 cm     TV Peak grad:   34.0 mmHg MV Decel Time: 176 msec     TV Vmax:        2.92 m/s MV E velocity: 136.00 cm/s  TR Peak grad:   37.2 mmHg                             TR Vmax:        305.00 cm/s                               SHUNTS                             Systemic VTI:  0.24 m  Systemic Diam: 2.20 cm Ida Rogue MD Electronically signed by Ida Rogue MD Signature Date/Time: 01/28/2020/4:02:34 PM    Final      Charlesetta Ivory. Cush[place]  If 7PM-7AM, please contact night-coverage [email or web address] 01/29/2020, 11:47 AM

## 2020-01-29 NOTE — Procedures (Signed)
PROCEDURE SUMMARY:  Successful US guided right thoracentesis. Yielded 1.2 liters of amber fluid. Pt tolerated procedure well. No immediate complications.  Specimen was sent for labs. CXR ordered.  EBL < 5 mL  Docia Barrier PA-C 01/29/2020 4:42 PM

## 2020-01-29 NOTE — Progress Notes (Signed)
Consent verified on the chart, blood transfusion started, education regarding transfusion reaction given. Will continue to monitor.

## 2020-01-30 ENCOUNTER — Inpatient Hospital Stay: Payer: PPO

## 2020-01-30 DIAGNOSIS — I248 Other forms of acute ischemic heart disease: Secondary | ICD-10-CM | POA: Diagnosis not present

## 2020-01-30 DIAGNOSIS — J189 Pneumonia, unspecified organism: Secondary | ICD-10-CM | POA: Diagnosis not present

## 2020-01-30 DIAGNOSIS — A419 Sepsis, unspecified organism: Secondary | ICD-10-CM | POA: Diagnosis not present

## 2020-01-30 DIAGNOSIS — I255 Ischemic cardiomyopathy: Secondary | ICD-10-CM | POA: Diagnosis not present

## 2020-01-30 DIAGNOSIS — J9601 Acute respiratory failure with hypoxia: Secondary | ICD-10-CM | POA: Diagnosis not present

## 2020-01-30 DIAGNOSIS — R55 Syncope and collapse: Secondary | ICD-10-CM | POA: Diagnosis not present

## 2020-01-30 LAB — HEMOGLOBIN AND HEMATOCRIT, BLOOD
HCT: 30 % — ABNORMAL LOW (ref 39.0–52.0)
Hemoglobin: 9.3 g/dL — ABNORMAL LOW (ref 13.0–17.0)

## 2020-01-30 LAB — RENAL FUNCTION PANEL
Albumin: 2.2 g/dL — ABNORMAL LOW (ref 3.5–5.0)
Anion gap: 6 (ref 5–15)
BUN: 13 mg/dL (ref 8–23)
CO2: 28 mmol/L (ref 22–32)
Calcium: 7.7 mg/dL — ABNORMAL LOW (ref 8.9–10.3)
Chloride: 103 mmol/L (ref 98–111)
Creatinine, Ser: 1.03 mg/dL (ref 0.61–1.24)
GFR, Estimated: >60 mL/min (ref >60)
Glucose, Bld: 93 mg/dL (ref 70–99)
Phosphorus: 3 mg/dL (ref 2.5–4.6)
Potassium: 3.7 mmol/L (ref 3.5–5.1)
Sodium: 137 mmol/L (ref 135–145)

## 2020-01-30 LAB — ACID FAST SMEAR (AFB, MYCOBACTERIA): Acid Fast Smear: NEGATIVE

## 2020-01-30 LAB — MAGNESIUM: Magnesium: 2 mg/dL (ref 1.7–2.4)

## 2020-01-30 MED ORDER — IPRATROPIUM-ALBUTEROL 0.5-2.5 (3) MG/3ML IN SOLN
3.0000 mL | Freq: Two times a day (BID) | RESPIRATORY_TRACT | Status: DC
  Administered 2020-01-30 – 2020-01-31 (×2): 3 mL via RESPIRATORY_TRACT
  Filled 2020-01-30 (×2): qty 3

## 2020-01-30 NOTE — Progress Notes (Signed)
Occupational Therapy Treatment Patient Details Name: Thomas [name] MRN: [id number] DOB: 19-Jan-1935 Today's Date: 01/30/2020    History of present illness Thomas [name] is an 56yoM who comes to [facility] on 11/12 after fall in BR at home, found on floor by wife. PMH: hearing impairment uses ASL, CAD, high-grade AVB s/p BiV-ICD, sCHF c recovered EF, CKD-3A, A. fib not on AC due to GI bleed/duodenal AVM, lymphedema, PUD, PrCA.  He had right-sided BiV-ICD insertion CRT-D on 6/18. Pt recently here and DC on 11/7 back to home c homehealth services. CTA chest negative for acute PE, but does reveal 5cm TAA, increased from 4.8cm on 05/19/19 imaging study.   OT comments  Pt seen for OT tx this date to f/u re: safety with ADLs/ADL mobility. OT engages pt in UB grooming/hygiene tasks. SETUP for seated (in bed, HOB elevated) ADLs including brushing teeth, washing face and combing hair. Pt declines OOB activity this date d/t pain from injections. OT facilitates ed re: importance of OOB activity to maintain strength and tolerance during acute stay in an effort to not return home weaker. Pt with moderate reception. Will continue to follow per OT POC. Continue to anticipate pt will require SUPV for safety at home upon d/c.    Follow Up Recommendations  No OT follow up;Supervision - Intermittent    Equipment Recommendations  None recommended by OT    Recommendations for Other Services      Precautions / Restrictions Precautions Precautions: Fall Restrictions Weight Bearing Restrictions: No       Mobility Bed Mobility               General bed mobility comments: pt declines to get OOB with OT this date citing pain  Transfers                 General transfer comment: pt declines to get OOB with OT this date citing pain    Balance                                           ADL either performed or assessed with clinical judgement   ADL                                          General ADL Comments: SETUP for seated (in bed, HOB elevated) UB grooming including brushing teeth, washing face and combing hair. Pt declines OOB activity this date d/t pain from injections.     Vision Baseline Vision/History: Wears glasses Patient Visual Report: No change from baseline     Perception     Praxis      Cognition Arousal/Alertness: Awake/alert Behavior During Therapy: WFL for tasks assessed/performed Overall Cognitive Status: Within Functional Limits for tasks assessed                                 General Comments: ASL interpreter utilized via iPad-Katie [phone]        Exercises Other Exercises Other Exercises: OT facilitates ed re: importance of OOB activity to maintain strength and tolerance during acute stay in an effort to not return home weaker. Pt with moderate reception.   Shoulder Instructions       General Comments  Pertinent Vitals/ Pain       Pain Assessment: 0-10 Pain Score: 6  Pain Location: back/general upper body d/t injections Pain Descriptors / Indicators: Tender;Sore Pain Intervention(s): Limited activity within patient's tolerance;Monitored during session  Home Living                                          Prior Functioning/Environment              Frequency  Min 1X/week        Progress Toward Goals  OT Goals(current goals can now be found in the care plan section)  Progress towards OT goals: OT to reassess next treatment  Acute Rehab OT Goals Patient Stated Goal: Go home OT Goal Formulation: With patient Time For Goal Achievement: 02/11/20 Potential to Achieve Goals: Good  Plan Discharge plan remains appropriate    Co-evaluation                 AM-PAC OT "6 Clicks" Daily Activity     Outcome Measure   Help from another person eating meals?: None Help from another person taking care of personal grooming?: A Little Help from another  person toileting, which includes using toliet, bedpan, or urinal?: A Little Help from another person bathing (including washing, rinsing, drying)?: A Little Help from another person to put on and taking off regular upper body clothing?: None Help from another person to put on and taking off regular lower body clothing?: A Lot 6 Click Score: 19    End of Session    OT Visit Diagnosis: Other abnormalities of gait and mobility (R26.89)   Activity Tolerance Patient tolerated treatment well;Patient limited by pain   Patient Left in bed;with call bell/phone within reach;with bed alarm set   Nurse Communication          Time: 8453-6468 OT Time Calculation (min): 23 min  Charges: OT General Charges $OT Visit: 1 Visit OT Treatments $Self Care/Home Management : 23-37 mins  Gerrianne Scale, [place], OTR/L ascom [phone] 01/30/20, 2:29 PM

## 2020-01-30 NOTE — Progress Notes (Signed)
PT Cancellation Note  Patient Details Name: Thomas [name] MRN: [id number] DOB: 1934-12-18   Cancelled Treatment:    Reason Eval/Treat Not Completed: Patient at procedure or test/unavailable. Pt off floor for thoracentesis. Will attempt again later date/time.    Yaritzel Stange C 01/30/2020, 11:38 AM

## 2020-01-30 NOTE — Progress Notes (Signed)
Pt back from ultra sound, per report from [place], RN, pt requiring 2L o2, for SOB. VSS, O2 saturation 100% on 2L, O2 removed, pt sats 99% on room, no acute distress noted. Will continue to monitor.

## 2020-01-30 NOTE — Progress Notes (Signed)
PROGRESS NOTE  Thomas [name] IRW:[id number] DOB: 1934-03-28   PCP: Thomas Pink, MD  Patient is from: home.  Lives with his wife.  Uses walker at baseline.  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 fib not on AC due to GI bleed, systolic CHF/second-degree AVB s/p BiV AICD, CVA without residual deficit, GIB with duodenal AVM and treated prostate cancer brought to ED by EMS after syncopal episode at home.  Admitted for acute respiratory failure, sepsis, pneumonia and possible CHF. Started on ceftriaxone, azithromycin and IV Lasix.  Cardiology consulted and following.  Reportedly started feeling short of breath in the morning. It seems he went to take shower and found down between the toilet commode and the bus stop.  Patient denies hitting his head or LOC.  When EMS arrived, hypoxic to 47% on RA, and recovered to 91% on CPAP.  In ED, slightly tachycardic to 100s.  Tachypneic to 30s.  BP 118/57.  95% on BiPAP.  WBC 5.4.  Hgb 8.6 (higher than baseline).  ABG normal.  BNP 266 (lower than baseline).  Troponin 35> 83.  COVID-19, influenza and RSV PCR negative.  UA without significant finding.  EKG paced rhythm.  CXR concerning for CHF.  Procalcitonin elevated.  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     Patient has been diuresing well with IV Lasix.  Heart thoracocentesis with removal of 1.2 L transudative and culture negative fluid from right lung.  Received 3 units of blood transfusion with appropriate response.  Now on room air.  Plan for left-sided thoracocentesis.   Hopefully home on 01/31/2020 once cleared by  cardiology.    Subjective: Seen and examined earlier this morning with the help of sign language interpreter with ID number [id number] on over interpreter iPad.  No major events overnight of this morning.  No complaints.  He denies chest pain, dyspnea, palpitation, dizziness, GI or UTI symptoms.  He is pleased with the outcome of thoracocentesis yesterday.  Objective: Vitals:   01/30/20 0407 01/30/20 0747 01/30/20 0819 01/30/20 1009  BP: 108/65  114/65 108/60  Pulse: 72  69 98  Resp: 19  18   Temp: 98.4 F (36.9 C)  98.4 F (36.9 C)   TempSrc: Oral  Oral   SpO2: 94% 96% 96% 94%  Weight: 78.4 kg     Height:        Intake/Output Summary (Last 24 hours) at 01/30/2020 1052 Last data filed at 01/30/2020 1025 Gross per 24 hour  Intake 1139.17 ml  Output 2050 ml  Net -910.83 ml   Filed Weights   01/28/20 0322 01/29/20 0341 01/30/20 0407  Weight: 79.9 kg 80.2 kg 78.4 kg    Examination:  GENERAL: No apparent distress.  Nontoxic. HEENT: MMM.  Vision grossly intact.  NECK: Supple.  No apparent JVD.  RESP: On RA.  No IWOB.  Coarse breath sounds bilaterally. CVS:  RRR. Heart sounds normal.  ABD/GI/GU: BS+. Abd soft, NTND.  MSK/EXT:  Moves extremities. No apparent deformity.  Trace edema bilaterally. SKIN: no apparent skin lesion or wound NEURO: Awake, alert and oriented appropriately.  No apparent focal neuro deficit. PSYCH: Calm. Normal affect.  Procedures:  None  Microbiology summarized: DTOIZ-12, influenza and  RSV PCR nonreactive. Respiratory cultures with normal flora.  Assessment & Plan: Acute hypoxemic respiratory failure-multifactorial including sepsis, community-acquired pneumonia, pleural effusion, CHF exacerbation and anemia.  Reportedly desaturated to 47% when EMS arrived.  Started on BiPAP.  Currently on room air.  Edema improved. -Treat treatable causes as below.   Severe sepsis due to community-acquired pneumonia-POA.  Concerned about aspiration pneumonia.  Met  criteria for severe sepsis with tachycardia, pneumonia and respiratory failure with hypoxia requiring BiPAP.  Procalcitonin elevated.  Respiratory culture with normal flora. Sepsis physiology resolved. -Continue ceftriaxone and azithromycin to complete 5 days course -Aspiration precautions.  Appreciate input by SLP. -Encourage incentive spirometry and flutter valve -Mucolytic's and antitussives.  Acute on chronic systolic CHF with recovered EF s/p CRT-D in 08/2019: TTE with EF of 40 to 45% (55 to 60% on TEE in 08/2019), RWMA, indeterminate DD, RVSP of 57.2, moderate to severe TVR (unchanged), moderate MVR and moderate LAE and large left pleural effusion. Has dyspnea and evidence of pulmonary congestion on CXR and CT but BNP lower than baseline.  Had 1.2 pleural fluid removed with right thoracocentesis.  2.5 L UOP/24 hours.  Creatinine improved. -Cardiology following. -Continue IV Lasix 40 mg twice daily per cardiology -Continue holding Aldactone losartan in the setting of soft blood pressures -Monitor fluid status, renal function and electrolytes -Check two-view chest x-ray to assess pleural effusion  Moderate bilateral pleural effusion: Likely due to CHF but difficult to exclude parapneumonic effusion.   -S/p right thoracocentesis with removal of 1.2 L transudative and culture negative fluid -Left thoracocentesis today.  Syncopal fall: Multiple potential etiologies including sepsis, respiratory failure, anemia and arrhythmia.  No head impact or loss of consciousness.  TTE as above. -Treat sepsis as above. -Transfused for anemia. -Continue telemetry monitoring -PT/OT eval. -Cardiology following.  Nonsustained V. tach: Patient had 18 beats of V. tach on 11/13.  He felt brief flutters, dizziness and shortness of breath.  Currently asymptomatic. -Defer to cardiology. -Optimize electrolytes.  Chronic hypotension: On midodrine at home.  Hypotension resolved. -Continue home  midodrine  Elevated troponin: High-sensitivity troponin 35>> 192 (peak)> 184.  Likely demand ischemia in the setting of hypoxia and sepsis.[name]  EKG without acute ischemic finding.  CTA negative for PE.  Patient without chest pain.  TTE as above. -Appreciate input by cardiology  History of  CAD s/p CABG and  multiple PCI: Elevated troponin but no chest pain.  EKG without acute ischemic finding. -Continue home medications  Second-degree AV block s/p AICD Permanent atrial fibrillation: Rate controlled without medication..  Not on AC due to GI bleed.  Anemia of chronic disease and chronic GI bleed: Hgb 8.6 on admit> 6.7>2u> 8.3>1u> 9.3.  Baseline 7.0-8.0.  Denies melena or hematochezia. -Continue holding aspirin and Lovenox. -SCD for VTE prophylaxis -Transfuse a unit of blood.  Cardiology recommended Hgb goal > 9.0. -Continue p.o. Protonix  Legally deaf, communicates with sign language. -Use language iPad   Debility/physical deconditioning: Uses walker at baseline. -PT/OT   Constipation: Has not a bowel movement in 2 days. -Bowel regimen.  Hypokalemia: Resolved.  Body mass index is 24.8 kg/m.        Pressure injury: Pressure Injury 06/21/19 Buttocks Mid Stage 2 -  Partial thickness loss of dermis presenting as a shallow open injury with a red, [name] wound bed without slough. (Active)  06/21/19 0700  Location: Buttocks  Location Orientation: Mid  Staging: Stage 2 -  Partial thickness loss of dermis presenting as a shallow open injury with  a red, [name] wound bed without slough.  Wound Description (Comments):   Present on Admission:    DVT prophylaxis:  Place and maintain sequential compression device Start: 01/27/20 1009 SCDs Start: 01/26/20 1757  Code Status: Full code Family Communication: Updated patient's wife using sign language interpreter Status is: Inpatient  Remains inpatient appropriate because:Ongoing diagnostic testing needed not appropriate for outpatient work  up, IV treatments appropriate due to intensity of illness or inability to take PO and Inpatient level of care appropriate due to severity of illness   Dispo: The patient is from: Home              Anticipated d/c is to: Home with home health              Anticipated d/c date is: 1 day              Patient currently is not medically stable to d/c.       Consultants:  Cardiology IR   Sch Meds:  Scheduled Meds: . docusate sodium  200 mg Oral Daily  . furosemide  40 mg Intravenous BID  . guaiFENesin  600 mg Oral BID  . ipratropium-albuterol  3 mL Nebulization TID  . midodrine  10 mg Oral TID WC  . pantoprazole  40 mg Oral BID  . simvastatin  20 mg Oral Daily  . sodium chloride flush  3 mL Intravenous Q12H   Continuous Infusions: . sodium chloride    . azithromycin 500 mg (01/29/20 1705)  . cefTRIAXone (ROCEPHIN)  IV 2 g (01/29/20 1521)   PRN Meds:.sodium chloride, acetaminophen **OR** acetaminophen, bisacodyl, guaiFENesin-dextromethorphan, nitroGLYCERIN, ondansetron **OR** ondansetron (ZOFRAN) IV, polyethylene glycol, sodium chloride flush  Antimicrobials: Anti-infectives (From admission, onward)   Start     Dose/Rate Route Frequency Ordered Stop   01/27/20 1430  cefTRIAXone (ROCEPHIN) 2 g in sodium chloride 0.9 % 100 mL IVPB        2 g 200 mL/hr over 30 Minutes Intravenous Every 24 hours 01/27/20 0803     01/26/20 1430  cefTRIAXone (ROCEPHIN) 1 g in sodium chloride 0.9 % 100 mL IVPB  Status:  Discontinued        1 g 200 mL/hr over 30 Minutes Intravenous Every 24 hours 01/26/20 1422 01/27/20 0803   01/26/20 1430  azithromycin (ZITHROMAX) 500 mg in sodium chloride 0.9 % 250 mL IVPB        500 mg 250 mL/hr over 60 Minutes Intravenous Every 24 hours 01/26/20 1422         I have personally reviewed the following labs and images: CBC: Recent Labs  Lab 01/26/20 0946 01/26/20 0946 01/27/20 0730 01/27/20 1725 01/28/20 0445 01/28/20 1605 01/29/20 0519 01/29/20 1722  01/30/20 0601  WBC 5.4  --  6.1  --  6.6 6.8 6.1  --   --   NEUTROABS 4.4  --   --   --   --   --   --   --   --   HGB 8.6*   < > 7.1*   < > 7.6* 8.3* 8.3* 9.6* 9.3*  HCT 28.6*   < > 23.3*   < > 24.4* 26.1* 26.6* 30.0* 30.0*  MCV 90.2  --  88.9  --  85.9 85.6 85.3  --   --   PLT 305  --  197  --  211 204 202  --   --    < > = values in this interval not displayed.  BMP &GFR Recent Labs  Lab 01/26/20 0946 01/27/20 0730 01/27/20 1725 01/28/20 0443 01/28/20 0445 01/29/20 0519 01/30/20 0601  NA 140 139  --  134*  --  138 137  K 3.7 3.9  --  3.9  --  3.3* 3.7  CL 105 105  --  100  --  103 103  CO2 22 27  --  24  --  26 28  GLUCOSE 153* 117*  --  114*  --  86 93  BUN 14 18  --  22  --  18 13  CREATININE 1.20 1.26*  --  1.19  --  1.09 1.03  CALCIUM 8.6* 8.2*  --  8.0*  --  8.0* 7.7*  MG  --   --  1.9  --  1.9 1.9 2.0  PHOS  --   --   --  3.4  --  3.1 3.0   Estimated Creatinine Clearance: 57.1 mL/min (by C-G formula based on SCr of 1.03 mg/dL). Liver & Pancreas: Recent Labs  Lab 01/26/20 0946 01/27/20 0730 01/28/20 0443 01/29/20 0519 01/30/20 0601  AST 35 24  --   --   --   ALT 16 13  --   --   --   ALKPHOS 139* 101  --   --   --   BILITOT 0.9 0.8  --   --   --   PROT 5.6* 5.2*  --   --   --   ALBUMIN 2.6* 2.2* 2.2* 2.2* 2.2*   No results for input(s): LIPASE, AMYLASE in the last 168 hours. No results for input(s): AMMONIA in the last 168 hours. Diabetic: No results for input(s): HGBA1C in the last 72 hours. No results for input(s): GLUCAP in the last 168 hours. Cardiac Enzymes: No results for input(s): CKTOTAL, CKMB, CKMBINDEX, TROPONINI in the last 168 hours. No results for input(s): PROBNP in the last 8760 hours. Coagulation Profile: No results for input(s): INR, PROTIME in the last 168 hours. Thyroid Function Tests: No results for input(s): TSH, T4TOTAL, FREET4, T3FREE, THYROIDAB in the last 72 hours. Lipid Profile: Recent Labs    01/29/20 0519  CHOL 89   HDL 29*  LDLCALC 48  TRIG 61  CHOLHDL 3.1   Anemia Panel: No results for input(s): VITAMINB12, FOLATE, FERRITIN, TIBC, IRON, RETICCTPCT in the last 72 hours. Urine analysis:    Component Value Date/Time   COLORURINE YELLOW (A) 01/26/2020 0941   APPEARANCEUR CLEAR (A) 01/26/2020 0941   APPEARANCEUR Clear 12/13/2017 0918   LABSPEC 1.017 01/26/2020 0941   LABSPEC 1.021 11/19/2011 1501   PHURINE 5.0 01/26/2020 0941   GLUCOSEU NEGATIVE 01/26/2020 0941   GLUCOSEU Negative 11/19/2011 1501   HGBUR NEGATIVE 01/26/2020 0941   BILIRUBINUR NEGATIVE 01/26/2020 0941   BILIRUBINUR Negative 12/13/2017 0918   BILIRUBINUR Negative 11/19/2011 1501   KETONESUR NEGATIVE 01/26/2020 0941   PROTEINUR NEGATIVE 01/26/2020 0941   NITRITE NEGATIVE 01/26/2020 0941   LEUKOCYTESUR NEGATIVE 01/26/2020 0941   LEUKOCYTESUR Negative 11/19/2011 1501   Sepsis Labs: Invalid input(s): PROCALCITONIN, [place]  Microbiology: Recent Results (from the past 240 hour(s))  Respiratory Panel by RT PCR (Flu A&B, Covid) - Nasopharyngeal Swab     Status: None   Collection Time: 01/26/20  9:46 AM   Specimen: Nasopharyngeal Swab  Result Value Ref Range Status   SARS Coronavirus 2 by RT PCR NEGATIVE NEGATIVE Final    Comment: (NOTE) SARS-CoV-2 target nucleic acids are NOT DETECTED.  The SARS-CoV-2 RNA is generally detectable  in upper respiratoy specimens during the acute phase of infection. The lowest concentration of SARS-CoV-2 viral copies this assay can detect is 131 copies/mL. A negative result does not preclude SARS-Cov-2 infection and should not be used as the sole basis for treatment or other patient management decisions. A negative result may occur with  improper specimen collection/handling, submission of specimen other than nasopharyngeal swab, presence of viral mutation(s) within the areas targeted by this assay, and inadequate number of viral copies (<131 copies/mL). A negative result must be combined  with clinical observations, patient history, and epidemiological information. The expected result is Negative.  Fact Sheet for Patients:  PinkCheek.be  Fact Sheet for Healthcare Providers:  GravelBags.it  This test is no t yet approved or cleared by the [place] FDA and  has been authorized for detection and/or diagnosis of SARS-CoV-2 by FDA under an Emergency Use Authorization (EUA). This EUA will remain  in effect (meaning this test can be used) for the duration of the COVID-19 declaration under Section 564(b)(1) of the Act, 21 U.S.C. section 360bbb-3(b)(1), unless the authorization is terminated or revoked sooner.     Influenza A by PCR NEGATIVE NEGATIVE Final   Influenza B by PCR NEGATIVE NEGATIVE Final    Comment: (NOTE) The Xpert Xpress SARS-CoV-2/FLU/RSV assay is intended as an aid in  the diagnosis of influenza from Nasopharyngeal swab specimens and  should not be used as a sole basis for treatment. Nasal washings and  aspirates are unacceptable for Xpert Xpress SARS-CoV-2/FLU/RSV  testing.  Fact Sheet for Patients: PinkCheek.be  Fact Sheet for Healthcare Providers: GravelBags.it  This test is not yet approved or cleared by the [place] FDA and  has been authorized for detection and/or diagnosis of SARS-CoV-2 by  FDA under an Emergency Use Authorization (EUA). This EUA will remain  in effect (meaning this test can be used) for the duration of the  Covid-19 declaration under Section 564(b)(1) of the Act, 21  U.S.C. section 360bbb-3(b)(1), unless the authorization is  terminated or revoked. Performed at [facility], [address] [place], [place] [address]   Expectorated sputum assessment w rflx to resp cult     Status: None   Collection Time: 01/26/20  2:20 PM   Specimen: Sputum  Result Value Ref Range Status   Specimen  Description SPUTUM  Final   Special Requests NONE  Final   Sputum evaluation   Final    THIS SPECIMEN IS ACCEPTABLE FOR SPUTUM CULTURE Performed at [facility], [address] [place], [place] [address]    Report Status 01/26/2020 FINAL  Final  Culture, respiratory     Status: None   Collection Time: 01/26/20  2:20 PM   Specimen: SPU  Result Value Ref Range Status   Specimen Description   Final    SPUTUM Performed at [facility], [address], [place], [place] [address]    Special Requests   Final    NONE Reflexed from [id number] Performed at [facility], [address] [place], [place] [address]    Gram Stain   Final    ABUNDANT WBC PRESENT, PREDOMINANTLY PMN MODERATE GRAM POSITIVE COCCI RARE GRAM POSITIVE RODS    Culture   Final    MODERATE Normal respiratory flora-no Staph aureus or Pseudomonas seen Performed at [facility], [place] [address], [place], [place] [address]    Report Status 01/29/2020 FINAL  Final  Body fluid culture     Status: None (Preliminary  result)   Collection Time: 01/29/20  4:46 PM   Specimen: PATH Cytology Pleural fluid  Result Value Ref Range Status   Specimen Description   Final    PLEURAL Performed at [facility], [address], [place], [place] [address]    Special Requests   Final    NONE Performed at [facility], [address] [place], [place] [address]    Gram Stain   Final    FEW WBC PRESENT, PREDOMINANTLY PMN NO ORGANISMS SEEN    Culture   Final    NO GROWTH < 12 HOURS Performed at [place] [address], [place], [place] [address]    Report Status PENDING  Incomplete    Radiology Studies: DG Chest 2 View  Result Date: 01/29/2020 CLINICAL DATA:  Shortness of breath.  Pleural effusion EXAM: CHEST - 2 VIEW COMPARISON:  Chest radiograph and CT angiogram chest January 26 2020 FINDINGS: There are pleural effusions bilaterally, similar to recent studies.  There is consolidation in the left lower lobe with patchy infiltrate in the right base. There is cardiomegaly with pulmonary vascular normal. Pacemaker leads are attached to the right ventricle and coronary sinus regions. Patient is status post coronary artery bypass grafting. There is aortic atherosclerosis. No adenopathy. There is degenerative change in each shoulder. IMPRESSION: Persistent cardiomegaly with pacemaker leads attached to right ventricle and coronary sinus. Status post coronary artery bypass grafting. Pleural effusions bilaterally with left lower lobe consolidation and patchy right base infiltrate. Appearance similar to recent studies. Aortic Atherosclerosis (ICD10-I70.0). Electronically Signed   By: Lowella Grip III M.D.   On: 01/29/2020 13:10   DG Chest Port 1 View  Result Date: 01/29/2020 CLINICAL DATA:  Status post thoracentesis EXAM: PORTABLE CHEST 1 VIEW COMPARISON:  01/29/2020, 12:30 p.m. FINDINGS: Interval decrease in volume of a right-sided pleural effusion, now small. Persistent left pleural effusion and or retrocardiac atelectasis. No pneumothorax. Diffuse interstitial pulmonary opacity, consistent with edema. Cardiomegaly with right chest multi lead pacer defibrillator. IMPRESSION: 1. Interval decrease in volume of a right-sided pleural effusion, now small. 2. No pneumothorax. 3. Persistent left pleural effusion and or retrocardiac atelectasis. 4. Pulmonary edema. 5. Cardiomegaly. Electronically Signed   By: Eddie Candle M.D.   On: 01/29/2020 17:05   US THORACENTESIS ASP PLEURAL SPACE W/IMG GUIDE  Result Date: 01/30/2020 INDICATION: Patient with history of congestive heart failure, bilateral pleural. Request is made diagnostic and therapeutic right thoracentesis. EXAM: ULTRASOUND GUIDED DIAGNOSTIC AND THERAPEUTIC RIGHT THORACENTESIS MEDICATIONS: 59m 1% lidocaine COMPLICATIONS: None immediate.  No pneumothorax on follow-up chest radiograph. PROCEDURE: An ultrasound guided  thoracentesis was thoroughly discussed with the patient and questions answered. The benefits, risks, alternatives and complications were also discussed. The patient understands and wishes to proceed with the procedure. Written consent was obtained. Ultrasound was performed to localize and mark an adequate pocket of fluid in the right chest. The area was then prepped and draped in the normal sterile fashion. 1% Lidocaine was used for local anesthesia. Under ultrasound guidance a 6 Fr Safe-T-Centesis catheter was introduced. Thoracentesis was performed. The catheter was removed and a dressing applied. FINDINGS: A total of approximately 1.2 liters of amber fluid was removed. Samples were sent to the laboratory as requested by the clinical team. IMPRESSION: Successful ultrasound guided diagnostic and therapeutic right thoracentesis yielding 1.2 liters of pleural fluid. Read by: KBrynda GreathousePA-C Electronically Signed   By: DLucrezia EuropeM.D.   On: 01/29/2020 16:47  Franchelle Foskett T. [place]  If 7PM-7AM, please contact night-coverage [email or web address] 01/30/2020, 10:52 AM

## 2020-01-30 NOTE — Progress Notes (Signed)
Progress Note  Patient Name: Thomas [name] Date of Encounter: 01/30/2020  Primary Cardiologist: Sanda Klein, MD   Subjective   No chest pain.  Reports improvement in earlier pain 2/2 bilateral thoracenteses.    Breathing has improved since right thoracentesis 11/15 and left thoracentesis 11/16.  Remains eager to go home.  Plans to lift weights and walk 1 mile when home with recommendation that he slowly increase activity as tolerated and not over exert himself.  Inpatient Medications    Scheduled Meds: . docusate sodium  200 mg Oral Daily  . furosemide  40 mg Intravenous BID  . guaiFENesin  600 mg Oral BID  . ipratropium-albuterol  3 mL Nebulization BID  . midodrine  10 mg Oral TID WC  . pantoprazole  40 mg Oral BID  . simvastatin  20 mg Oral Daily  . sodium chloride flush  3 mL Intravenous Q12H   Continuous Infusions: . sodium chloride    . cefTRIAXone (ROCEPHIN)  IV 2 g (01/30/20 1525)   PRN Meds: sodium chloride, acetaminophen **OR** acetaminophen, bisacodyl, guaiFENesin-dextromethorphan, nitroGLYCERIN, ondansetron **OR** ondansetron (ZOFRAN) IV, polyethylene glycol, sodium chloride flush   Vital Signs    Vitals:   01/30/20 0819 01/30/20 1009 01/30/20 1059 01/30/20 1131  BP: 114/65 108/60 113/67 103/60  Pulse: 69 98 77 66  Resp: 18   19  Temp: 98.4 F (36.9 C)   98.1 F (36.7 C)  TempSrc: Oral   Oral  SpO2: 96% 94% 99% 99%  Weight:      Height:        Intake/Output Summary (Last 24 hours) at 01/30/2020 1532 Last data filed at 01/30/2020 1200 Gross per 24 hour  Intake 720 ml  Output 1775 ml  Net -1055 ml   Last 3 Weights 01/30/2020 01/29/2020 01/28/2020  Weight (lbs) 172 lb 13.5 oz 176 lb 12.8 oz 176 lb 2 oz  Weight (kg) 78.4 kg 80.196 kg 79.89 kg      Telemetry    Atrial fibrillation, paced, ventricular rate, PVCs, short runs of NSVT - Personally Reviewed  ECG    No new tracings - Personally Reviewed  Physical Exam   GEN: No acute  distress.   Frail and elderly gentleman. Neck: No JVD Cardiac: IRIR with 2/6 systolic murmur best appreciated at LLSB/5th ICS. No rubs or gallops.  Respiratory:  reduced bibasilar breath sounds. GI: Soft, nontender, non-distended  MS: No pitting edema, SCDs in place; No deformity. Neuro:  Nonfocal. Deaf. Psych: Normal affect    Labs    High Sensitivity Troponin:   Recent Labs  Lab 01/26/20 1205 01/26/20 1500 01/26/20 1840 01/27/20 0017 01/27/20 0730  TROPONINIHS 83* 168* 192* 184* 118*      Chemistry Recent Labs  Lab 01/26/20 0946 01/26/20 0946 01/27/20 0730 01/27/20 0730 01/28/20 0443 01/29/20 0519 01/30/20 0601  NA 140   < > 139   < > 134* 138 137  K 3.7   < > 3.9   < > 3.9 3.3* 3.7  CL 105   < > 105   < > 100 103 103  CO2 22   < > 27   < > 24 26 28   GLUCOSE 153*   < > 117*   < > 114* 86 93  BUN 14   < > 18   < > 22 18 13   CREATININE 1.20   < > 1.26*   < > 1.19 1.09 1.03  CALCIUM 8.6*   < > 8.2*   < >  8.0* 8.0* 7.7*  PROT 5.6*  --  5.2*  --   --   --   --   ALBUMIN 2.6*   < > 2.2*   < > 2.2* 2.2* 2.2*  AST 35  --  24  --   --   --   --   ALT 16  --  13  --   --   --   --   ALKPHOS 139*  --  101  --   --   --   --   BILITOT 0.9  --  0.8  --   --   --   --   GFRNONAA >60   < > 57*   < > >60 >60 >60  ANIONGAP 13   < > 7   < > 10 9 6    < > = values in this interval not displayed.     Hematology Recent Labs  Lab 01/28/20 0445 01/28/20 0445 01/28/20 1605 01/28/20 1605 01/29/20 0519 01/29/20 1722 01/30/20 0601  WBC 6.6  --  6.8  --  6.1  --   --   RBC 2.84*  --  3.05*  --  3.12*  --   --   HGB 7.6*   < > 8.3*   < > 8.3* 9.6* 9.3*  HCT 24.4*   < > 26.1*   < > 26.6* 30.0* 30.0*  MCV 85.9  --  85.6  --  85.3  --   --   MCH 26.8  --  27.2  --  26.6  --   --   MCHC 31.1  --  31.8  --  31.2  --   --   RDW 18.6*  --  18.2*  --  18.4*  --   --   PLT 211  --  204  --  202  --   --    < > = values in this interval not displayed.    BNP Recent Labs  Lab  01/26/20 0946  BNP 266.7*     DDimer No results for input(s): DDIMER in the last 168 hours.   Radiology    DG Chest 2 View  Result Date: 01/29/2020 CLINICAL DATA:  Shortness of breath.  Pleural effusion EXAM: CHEST - 2 VIEW COMPARISON:  Chest radiograph and CT angiogram chest January 26 2020 FINDINGS: There are pleural effusions bilaterally, similar to recent studies. There is consolidation in the left lower lobe with patchy infiltrate in the right base. There is cardiomegaly with pulmonary vascular normal. Pacemaker leads are attached to the right ventricle and coronary sinus regions. Patient is status post coronary artery bypass grafting. There is aortic atherosclerosis. No adenopathy. There is degenerative change in each shoulder. IMPRESSION: Persistent cardiomegaly with pacemaker leads attached to right ventricle and coronary sinus. Status post coronary artery bypass grafting. Pleural effusions bilaterally with left lower lobe consolidation and patchy right base infiltrate. Appearance similar to recent studies. Aortic Atherosclerosis (ICD10-I70.0). Electronically Signed   By: Lowella Grip III M.D.   On: 01/29/2020 13:10   DG Chest Port 1 View  Result Date: 01/30/2020 CLINICAL DATA:  Status post left thoracentesis. EXAM: PORTABLE CHEST 1 VIEW COMPARISON:  Yesterday. FINDINGS: Significant decrease in left pleural fluid and associated left basilar atelectasis. Mild increase in size of a small right pleural effusion. No pneumothorax. Enlarged cardiac silhouette without significant change. Post CABG changes and right subclavian pacer and AICD leads are again demonstrated. Stable prominence of the interstitial markings.  Thoracic spine and right shoulder degenerative changes. IMPRESSION: 1. No pneumothorax following left thoracentesis. 2. Mild increase in size of a small right pleural effusion. 3. Stable cardiomegaly and chronic interstitial lung disease with possible mild superimposed  interstitial pulmonary edema. 4. Decreased left basilar atelectasis. Electronically Signed   By: Claudie Revering M.D.   On: 01/30/2020 11:26   DG Chest Port 1 View  Result Date: 01/29/2020 CLINICAL DATA:  Status post thoracentesis EXAM: PORTABLE CHEST 1 VIEW COMPARISON:  01/29/2020, 12:30 p.m. FINDINGS: Interval decrease in volume of a right-sided pleural effusion, now small. Persistent left pleural effusion and or retrocardiac atelectasis. No pneumothorax. Diffuse interstitial pulmonary opacity, consistent with edema. Cardiomegaly with right chest multi lead pacer defibrillator. IMPRESSION: 1. Interval decrease in volume of a right-sided pleural effusion, now small. 2. No pneumothorax. 3. Persistent left pleural effusion and or retrocardiac atelectasis. 4. Pulmonary edema. 5. Cardiomegaly. Electronically Signed   By: Eddie Candle M.D.   On: 01/29/2020 17:05   US THORACENTESIS ASP PLEURAL SPACE W/IMG GUIDE  Result Date: 01/30/2020 INDICATION: Patient history of congestive heart failure, bilateral pleural effusions status post right thoracentesis yesterday who presents to IR today for therapeutic left thoracentesis. EXAM: ULTRASOUND GUIDED LEFT THORACENTESIS MEDICATIONS: 13 mL 1% lidocaine COMPLICATIONS: None immediate. PROCEDURE: An ultrasound guided thoracentesis was thoroughly discussed with the patient and questions answered. The benefits, risks, alternatives and complications were also discussed. The patient understands and wishes to proceed with the procedure. Written consent was obtained. Ultrasound was performed to localize and mark an adequate pocket of fluid in the left chest. The area was then prepped and draped in the normal sterile fashion. 1% Lidocaine was used for local anesthesia. Under ultrasound guidance a 6 Fr Safe-T-Centesis catheter was introduced. Thoracentesis was performed. The catheter was removed and a dressing applied. FINDINGS: A total of approximately 1.3 L of clear yellow fluid  was removed. IMPRESSION: Successful ultrasound guided left thoracentesis yielding 1.3 L of pleural fluid. Read by Candiss Norse, PA-C Electronically Signed   By: Markus Daft M.D.   On: 01/30/2020 11:06   US THORACENTESIS ASP PLEURAL SPACE W/IMG GUIDE  Result Date: 01/30/2020 INDICATION: Patient with history of congestive heart failure, bilateral pleural. Request is made diagnostic and therapeutic right thoracentesis. EXAM: ULTRASOUND GUIDED DIAGNOSTIC AND THERAPEUTIC RIGHT THORACENTESIS MEDICATIONS: 32mL 1% lidocaine COMPLICATIONS: None immediate.  No pneumothorax on follow-up chest radiograph. PROCEDURE: An ultrasound guided thoracentesis was thoroughly discussed with the patient and questions answered. The benefits, risks, alternatives and complications were also discussed. The patient understands and wishes to proceed with the procedure. Written consent was obtained. Ultrasound was performed to localize and mark an adequate pocket of fluid in the right chest. The area was then prepped and draped in the normal sterile fashion. 1% Lidocaine was used for local anesthesia. Under ultrasound guidance a 6 Fr Safe-T-Centesis catheter was introduced. Thoracentesis was performed. The catheter was removed and a dressing applied. FINDINGS: A total of approximately 1.2 liters of amber fluid was removed. Samples were sent to the laboratory as requested by the clinical team. IMPRESSION: Successful ultrasound guided diagnostic and therapeutic right thoracentesis yielding 1.2 liters of pleural fluid. Read by: Brynda Greathouse PA-C Electronically Signed   By: Lucrezia Europe M.D.   On: 01/29/2020 16:47    Cardiac Studies   ECHO06/11/2019:  1. Left ventricular ejection fraction, by estimation, is 55 to 60%. The  left ventricle has normal function. The left ventricle has no regional  wall motion abnormalities.  2. Right  ventricular systolic function is normal. The right ventricular  size is normal.  3. Left atrial  size was mild to moderately dilated. No left atrial/left  atrial appendage thrombus was detected.  4. Right atrial size was mildly dilated.  5. The mitral valve is normal in structure. Mild mitral valve  regurgitation.  6. Tricuspid valve regurgitation is moderate to severe.  7. The aortic valve is tricuspid. Aortic valve regurgitation is mild.  Echo 01/28/20 1. Left ventricular ejection fraction, by estimation, is 40 to 45%. The  left ventricle has mildly decreased function. The left ventricle  demonstrates regional wall motion abnormalities (Challenging images,  though there is concern for inferior/posterior  hypokinesis). The left ventricular internal cavity size was moderately  dilated. Left ventricular diastolic parameters are indeterminate.  2. Right ventricular systolic function is normal. The right ventricular  size is moderately enlarged. There is moderately elevated pulmonary artery  systolic pressure. The estimated right ventricular systolic pressure is  69.4 mmHg.  3. Left atrial size was moderately dilated.  4. The mitral valve is normal in structure. Moderate mitral valve  regurgitation.  5. Tricuspid valve regurgitation is moderate to severe.  6. Aortic valve regurgitation is mild.  7. The inferior vena cava is dilated in size with <50% respiratory  variability, suggesting right atrial pressure of 15 mmHg.  8. Large left pleural effusion noted   Patient Profile     85 y.o. male with history of CAD s/p remote CABG with subsequent PCI/BMS to the SVG to OM 2015 with known CTO of the SVG to RCA and patent LIMA to LAD, chronic combined systolic and diastolic CHFwith recovery of EF by most recent echo, ICM, [employer] CRT-D implanted in 2014, permanent Afib not on [place] secondary to underlying microcytic anemia with known AVMs as well as falls, CHB, severe TR, prior GIB requiring PRBC transfusion with subsequent discovery of aforementioned AVMs, CKDIII, prostate CA,  HTN, HLD, and deafness, who is being seen for the evaluation of elevated HS Tn.  Assessment & Plan    Elevated high-sensitivity troponin History of CAD s/p CABG with PCI  Demand Ischemia suspected --Noangina reported. CTA negative for PE. --High-sensitivity troponin minimally elevated and flat trending. --EKG without acute ST/T changes. --Echo with EF 40-45%, reduced from previous 55-60%. --Suspect supply demand ischemia in the setting of his recent GI bleed, tachycardic rates, and comorbid conditions as outlined below.  Less consistent with ACS.  No plan for further ischemic work-up.   --Continue medical management.  No ASA in the setting of recent GI bleed. Restart home medications as tolerated by BP and renal function to ensure on GDMT, including his BB, discontinued in the outpatient setting. Continue statin and risk factor modification.  Permanent Afib not on OAC --Rate controlled. Not on a BB due to low BP and controlled ventricular rate.  --CHA2DS2VASc score of at least5 (CHF, HTN, age1, vascular). --Not on [place] 2/2 risk of bleeding given previous GIB, AVMs, and frequent falls.  Chronic combined systolic and diastolic CHF/ICMwith recovery of EF S/p CRT-D with reimplantation 08/2019 --No current SOB. --S/p CRT-D with reimplantation 08/2019 after an infection as above.  --Breathing improved s/p bilateral thoracentesis on 11/15 and 11/16. --Currently on IV lasix 40mg  BID. --Recommend transition to oral diuresis prior to discharge. --Monitor I's/O's.  Daily weights. Daily BMET. --Continue medical management. --Escalate GDMT / restart home medications as tolerated by BP / Cr at RTC.  NSVT --Noted on telemetry. --Maintain electrolytes at goal.  CKDIII --Daily BMET. --Current  renal function stable.  Hypokalemia, hypomagnesemia   --Replete with K goal 4.0.  Mg with goal2.0.  HTN --Currently BP well controlled to hypotensive on midodrine. Ideally, would wean off  of midodrine. Continue PTA medications as BP / renal functionallows.  Aortic dilation as seen on CT --As seen on CT, 4.8cm  5.0cm. Continue to monitor with periodic imaging. OP vascular surgery consult recommended per guidelines. Continue statin / aggressive risk factor modification. No ASA in the setting of GIB.  Anemia with known AVM; melena --Recent GIB s/p ablation.  --Transfuse as needed to maintain stable H&H, per IM.  [facility] will sign off.   Follow up as an outpatient:  1-2 weeks.  Further recommendations per MD.  For questions or updates, please contact [place] Please consult [email or web address] for contact info under        Signed, [name] Chaco, PA-C  01/30/2020, 3:32 PM

## 2020-01-30 NOTE — Progress Notes (Signed)
Pt scored a 2 on the RT protocol assessment. The nebulizer treatments are changed to BID.

## 2020-01-30 NOTE — Procedures (Signed)
PROCEDURE SUMMARY:  Successful image-guided left thoracentesis. Yielded 1.3 liters of clear yellow fluid. Patient tolerated procedure well - he developed some left shoulder pain during the procedure as well as some post procedural dyspnea. Vital signs remained stable throughout (SpO2 93-96%, BP soft but near baseline). His symptoms improved with 2L of oxygen via [place] and SpO2 was 100%. Supplemental oxygen may d/ced upon return to the floor if patient is asymptomatic. EBL: Zero No immediate complications.  Specimen was not sent for labs. Post procedure CXR shows no pneumothorax.  Please see imaging section of Epic for full dictation.  Joaquim Nam PA-C 01/30/2020 10:58 AM

## 2020-01-31 DIAGNOSIS — I5023 Acute on chronic systolic (congestive) heart failure: Secondary | ICD-10-CM

## 2020-01-31 DIAGNOSIS — N1831 Chronic kidney disease, stage 3a: Secondary | ICD-10-CM | POA: Diagnosis not present

## 2020-01-31 DIAGNOSIS — I959 Hypotension, unspecified: Secondary | ICD-10-CM | POA: Diagnosis not present

## 2020-01-31 DIAGNOSIS — I130 Hypertensive heart and chronic kidney disease with heart failure and stage 1 through stage 4 chronic kidney disease, or unspecified chronic kidney disease: Secondary | ICD-10-CM | POA: Diagnosis not present

## 2020-01-31 DIAGNOSIS — I4821 Permanent atrial fibrillation: Secondary | ICD-10-CM | POA: Diagnosis not present

## 2020-01-31 DIAGNOSIS — I2510 Atherosclerotic heart disease of native coronary artery without angina pectoris: Secondary | ICD-10-CM | POA: Diagnosis not present

## 2020-01-31 DIAGNOSIS — I5042 Chronic combined systolic (congestive) and diastolic (congestive) heart failure: Secondary | ICD-10-CM | POA: Diagnosis not present

## 2020-01-31 DIAGNOSIS — I890 Lymphedema, not elsewhere classified: Secondary | ICD-10-CM | POA: Diagnosis not present

## 2020-01-31 LAB — CBC
HCT: 30.7 % — ABNORMAL LOW (ref 39.0–52.0)
Hemoglobin: 9.7 g/dL — ABNORMAL LOW (ref 13.0–17.0)
MCH: 27.6 pg (ref 26.0–34.0)
MCHC: 31.6 g/dL (ref 30.0–36.0)
MCV: 87.5 fL (ref 80.0–100.0)
Platelets: 206 10*3/uL (ref 150–400)
RBC: 3.51 MIL/uL — ABNORMAL LOW (ref 4.22–5.81)
RDW: 18.1 % — ABNORMAL HIGH (ref 11.5–15.5)
WBC: 5.6 10*3/uL (ref 4.0–10.5)
nRBC: 0 % (ref 0.0–0.2)

## 2020-01-31 LAB — RENAL FUNCTION PANEL
Albumin: 2.2 g/dL — ABNORMAL LOW (ref 3.5–5.0)
Anion gap: 8 (ref 5–15)
BUN: 13 mg/dL (ref 8–23)
CO2: 28 mmol/L (ref 22–32)
Calcium: 7.9 mg/dL — ABNORMAL LOW (ref 8.9–10.3)
Chloride: 102 mmol/L (ref 98–111)
Creatinine, Ser: 1.05 mg/dL (ref 0.61–1.24)
GFR, Estimated: >60 mL/min (ref >60)
Glucose, Bld: 90 mg/dL (ref 70–99)
Phosphorus: 3.1 mg/dL (ref 2.5–4.6)
Potassium: 3.7 mmol/L (ref 3.5–5.1)
Sodium: 138 mmol/L (ref 135–145)

## 2020-01-31 LAB — PROTEIN, BODY FLUID (OTHER): Total Protein, Body Fluid Other: 1.6 g/dL

## 2020-01-31 LAB — MAGNESIUM: Magnesium: 2 mg/dL (ref 1.7–2.4)

## 2020-01-31 LAB — CYTOLOGY - NON PAP

## 2020-01-31 NOTE — TOC Transition Note (Signed)
Transition of Care [facility]) - CM/SW Discharge Note   Patient Details  Name: Thomas [name] MRN: [id number] Date of Birth: [birth year]  [facility]) CM/SW Contact:  Kerin Salen, RN Phone Number: 01/31/2020, 11:45 AM   Clinical Narrative:  Patient to be discharged to home, wife providing transportation. [place] services, RN/PT/OT to be provided by [facility]. Barriers resolved, TOC CM complete.     Final next level of care: [place] Barriers to Discharge: Barriers Resolved   Patient Goals and CMS Choice Patient states their goals for this hospitalization and ongoing recovery are:: Return home with family      Discharge Placement                       Discharge Plan and Services                          HH Arranged: RN, PT, OT [facility] Agency: [facility] Date [facility] Agency Contacted: 01/31/20 Time [place]: [id number] Representative spoke with at [place]: [place] (SDOH) Interventions     Readmission Risk Interventions Readmission Risk Prevention Plan 08/21/2019 06/23/2019 06/20/2019  Transportation Screening Complete Complete Complete  PCP or Specialist Appt within 3-5 Days - Complete -  HRI or [place] - Complete Complete  Social Work Consult for [place] Planning/Counseling - Complete -  Palliative Care Screening - Not Applicable Not Applicable  Medication Review [employer] photographer) Complete Complete Complete  PCP or Specialist appointment within 3-5 days of discharge Complete - -  [place] or Home Care Consult Complete - -  [place] Complete - -  Some recent data might be hidden

## 2020-01-31 NOTE — Discharge Summary (Signed)
Physician Discharge Summary  Patient ID: Thomas [name] MRN: [id number] DOB/AGE: [birth year] 85 y.o.  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 fib not on AC due to GI bleed, systolic CHF/second-degree AVB s/p BiV AICD, CVA without residual deficit, GIB with duodenal AVM and treated prostate cancer brought to ED by EMS after syncopal episode at home.  Admitted for acute respiratory failure, sepsis, pneumonia and possible CHF. Started on ceftriaxone, azithromycin and IV Lasix.  Cardiology consulted and following.  Patient has been diuresing well with IV Lasix.  Heart thoracocentesis with removal of 1.2 L transudative and culture negative fluid from right lung.  Received 3 units of blood transfusion with appropriate response.  Now on room air.  Left-sided thoracocentesis 11/16 with removal of 1.3 L of fluids.  Patient condition had improved, currently he is euvolemic.  He has completed antibiotics.  He is medically stable to be discharged.  #1.  Acute hypoxemic respiratory failure, sepsis, multifocal aspiration pneumonia. Condition had improved.  Currently off oxygen.  Antibiotics completed.  2.  Bilateral pleural effusion.  Status post bilateral thoracentesis.  3.  Acute on chronic systolic congestive heart failure. Moderate to severe tricuspid regurgitation,  moderate mitral valve regurgitation. Patient has received IV Lasix, will resume home dose of torsemide and Aldactone.  4.  Syncopal and collapse. Secondary to sepsis and respiratory failure.  5.  Nonsustained VT. Patient had one episode with 18 beats of intratachycardia, no recurrence.  6.  Anemia of chronic disease with chronic GI bleed.  Patient received 3 units of PRBC transfusion.  Hemoglobin has been stable.  No gross black stool or rectal bleeding.  7.  Debility.    Consults: cardiology  Significant Diagnostic Studies:  CT ANGIOGRAPHY CHEST WITH CONTRAST  TECHNIQUE: Multidetector CT imaging of the chest was performed using the standard protocol during bolus administration of intravenous contrast. Multiplanar CT image reconstructions and MIPs were obtained to evaluate the vascular anatomy.  CONTRAST:  65mL OMNIPAQUE IOHEXOL 350 MG/ML SOLN  COMPARISON:  CT 05/19/2019  FINDINGS: Cardiovascular: Satisfactory opacification of the pulmonary arteries. No evidence of filling defect to the lobar branch level. Evaluation is somewhat limited by respiratory motion artifact and extensive streak and beam hardening artifact related to patient's pacemaker. Mid ascending thoracic aorta measures 5.0 cm in diameter (previously 4.8 cm on 05/19/2019). There is tortuosity of the descending thoracic aorta. Atherosclerotic calcification of the aorta and coronary arteries. Moderate four-chamber cardiomegaly. Status post CABG.  Mediastinum/Nodes: There are a few mildly prominent mediastinal lymph nodes including index upper right paratracheal node measuring 12 mm (series 6, image 25), previously measured 9 mm. No axillary or hilar lymphadenopathy. Trachea, esophagus, and thyroid gland appear within normal limits.  Lungs/Pleura: Moderate bilateral pleural effusions with associated compressive atelectasis. Patchy airspace opacities within the dependent portions of the bilateral lower  lobes, right middle lobe, and lingula suspicious for superimposed pneumonia or aspiration. No pneumothorax.  Upper Abdomen: Reflux of contrast into the IVC and hepatic veins. Left hepatic lobe cyst.  Musculoskeletal: No chest wall abnormality. No acute or significant osseous findings.  Review of the MIP images confirms the above findings.  IMPRESSION: 1. Negative for pulmonary embolism to the lobar branch level. 2. Moderate bilateral pleural effusions with associated compressive atelectasis. 3. Patchy airspace opacities within the dependent portions of the bilateral lower lobes, right middle lobe, and lingula suspicious for superimposed pneumonia or aspiration. 4. Mid ascending thoracic aortic aneurysm measuring up to 5.0 cm in diameter (previously 4.8 cm on 05/19/2019). Recommend semi-annual imaging followup by CTA or MRA and referral to cardiothoracic surgery if not already obtained. This recommendation follows 2010 ACCF/AHA/AATS/ACR/ASA/SCA/SCAI/SIR/STS/SVM Guidelines for the Diagnosis and Management of Patients With Thoracic Aortic Disease. Circulation. 2010; 121: A355-D322. Aortic aneurysm NOS (ICD10-I71.9) 5. Cardiomegaly with evidence of right heart dysfunction. 6. Aortic and coronary artery atherosclerosis.  (ICD10-I70.0).   Electronically Signed   By: Davina Poke D.O.   On: 01/26/2020 13:56  Treatments: IV Lasix, thoracentesis.  Discharge Exam: Blood pressure 106/61, pulse 69, temperature 98.2 F (36.8 C), temperature source Oral, resp. rate 18, height 5\' 10"  (1.778 m), weight 79.2 kg, SpO2 93 %. General appearance: alert and cooperative Resp: clear to auscultation bilaterally Cardio: regular rate and rhythm, S1, S2 normal, no murmur, click, rub or gallop GI: soft, non-tender; bowel sounds normal; no masses,  no organomegaly Extremities: extremities normal, atraumatic, no cyanosis or edema  Disposition: Discharge disposition: 01-Home or Self  Care       Discharge Instructions    Diet - low sodium heart healthy   Complete by: As directed    Fluid restriction 1560ml/day   Discharge wound care:   Complete by: As directed    Keep clean   Increase activity slowly   Complete by: As directed      Allergies as of 01/31/2020      Reactions   Entresto [sacubitril-valsartan] Swelling   And bruising of arm   Phenazopyridine Nausea Only, Other (See Comments)   GI UPSET   Ramipril Other (See Comments)   unk Other reaction(s): Other (See Comments), Unknown unk      Medication List    TAKE these medications   acetaminophen 325 MG tablet Commonly known as: TYLENOL Take 2 tablets (650 mg total) by mouth every 6 (six) hours as needed for mild pain or fever.   albuterol (2.5 MG/3ML) 0.083% nebulizer solution Commonly known as: PROVENTIL Inhale 3 mLs into the lungs every 4 (four) hours as needed for wheezing or shortness of breath.   ferrous sulfate 325 (65 FE) MG tablet Take 1 tablet (325 mg total) by mouth 2 (two) times daily with a meal. What changed: when to take this   Gerhardt's butt cream Crea Apply 1 application topically 2 (two) times daily. What changed:   when to take this  reasons to take this   losartan 25 MG tablet Commonly known as: COZAAR Take 25 mg by mouth daily.   midodrine 5 MG tablet Commonly known as: PROAMATINE TAKE 1 TABLET (5 MG TOTAL) BY MOUTH 3 (THREE) TIMES DAILY WITH MEALS.   multivitamin with minerals Tabs tablet Take 1 tablet by mouth daily.   ondansetron 4 MG tablet Commonly known as: Zofran Take 1 tablet (4 mg total) by mouth every 8 (eight) hours as needed for nausea or vomiting.   pantoprazole 40 MG tablet Commonly known as: Protonix Take 1 tablet (40 mg total) by mouth 2 (  two) times daily.   simvastatin 20 MG tablet Commonly known as: ZOCOR TAKE 1 TABLET BY MOUTH EVERY DAY What changed: when to take this   spironolactone 25 MG tablet Commonly known as:  ALDACTONE Take 1 tablet (25 mg total) by mouth daily.   torsemide 20 MG tablet Commonly known as: DEMADEX Take 2 tablets (40 mg total) by mouth 2 (two) times daily.            Discharge Care Instructions  (From admission, onward)         Start     Ordered   01/31/20 0000  Discharge wound care:       Comments: Keep clean   01/31/20 0912          Follow-up Information    [name], MD Follow up in 1 week(s).   Specialty: Family Medicine Contact information: [address] [place] [address] [phone]        Sanda Klein, MD Follow up in 2 week(s).   Specialty: Cardiology Contact information: [address] [place] [address] [phone]              34 minutes Signed: Sharen Hones 01/31/2020, 9:12 AM

## 2020-02-01 ENCOUNTER — Encounter: Payer: PPO | Admitting: Nurse Practitioner

## 2020-02-01 LAB — TYPE AND SCREEN
ABO/RH(D): O POS
Antibody Screen: NEGATIVE
Donor AG Type: NEGATIVE
Donor AG Type: NEGATIVE
Donor AG Type: NEGATIVE
Unit division: 0
Unit division: 0
Unit division: 0
Unit division: 0
Unit division: 0

## 2020-02-01 LAB — BPAM RBC
Blood Product Expiration Date: 202112092359
Blood Product Expiration Date: 202112102359
Blood Product Expiration Date: 202112172359
Blood Product Expiration Date: 202112172359
Blood Product Expiration Date: 202112222359
ISSUE DATE / TIME: 202111132043
ISSUE DATE / TIME: 202111141037
ISSUE DATE / TIME: 202111151153
Unit Type and Rh: 5100
Unit Type and Rh: 5100
Unit Type and Rh: 5100
Unit Type and Rh: 5100
Unit Type and Rh: 5100

## 2020-02-01 LAB — PH, BODY FLUID: pH, Body Fluid: 7.5

## 2020-02-02 ENCOUNTER — Other Ambulatory Visit: Payer: Self-pay | Admitting: *Deleted

## 2020-02-02 DIAGNOSIS — I252 Old myocardial infarction: Secondary | ICD-10-CM | POA: Diagnosis not present

## 2020-02-02 DIAGNOSIS — Z8546 Personal history of malignant neoplasm of prostate: Secondary | ICD-10-CM | POA: Diagnosis not present

## 2020-02-02 DIAGNOSIS — E785 Hyperlipidemia, unspecified: Secondary | ICD-10-CM | POA: Diagnosis not present

## 2020-02-02 DIAGNOSIS — Z950 Presence of cardiac pacemaker: Secondary | ICD-10-CM | POA: Diagnosis not present

## 2020-02-02 DIAGNOSIS — D631 Anemia in chronic kidney disease: Secondary | ICD-10-CM | POA: Diagnosis not present

## 2020-02-02 DIAGNOSIS — I4821 Permanent atrial fibrillation: Secondary | ICD-10-CM | POA: Diagnosis not present

## 2020-02-02 DIAGNOSIS — I255 Ischemic cardiomyopathy: Secondary | ICD-10-CM | POA: Diagnosis not present

## 2020-02-02 DIAGNOSIS — I2510 Atherosclerotic heart disease of native coronary artery without angina pectoris: Secondary | ICD-10-CM | POA: Diagnosis not present

## 2020-02-02 DIAGNOSIS — Z951 Presence of aortocoronary bypass graft: Secondary | ICD-10-CM | POA: Diagnosis not present

## 2020-02-02 DIAGNOSIS — D500 Iron deficiency anemia secondary to blood loss (chronic): Secondary | ICD-10-CM

## 2020-02-02 DIAGNOSIS — N1831 Chronic kidney disease, stage 3a: Secondary | ICD-10-CM | POA: Diagnosis not present

## 2020-02-02 DIAGNOSIS — I959 Hypotension, unspecified: Secondary | ICD-10-CM | POA: Diagnosis not present

## 2020-02-02 DIAGNOSIS — Z87891 Personal history of nicotine dependence: Secondary | ICD-10-CM | POA: Diagnosis not present

## 2020-02-02 DIAGNOSIS — H918X3 Other specified hearing loss, bilateral: Secondary | ICD-10-CM | POA: Diagnosis not present

## 2020-02-02 DIAGNOSIS — I890 Lymphedema, not elsewhere classified: Secondary | ICD-10-CM | POA: Diagnosis not present

## 2020-02-02 DIAGNOSIS — Z9181 History of falling: Secondary | ICD-10-CM | POA: Diagnosis not present

## 2020-02-02 DIAGNOSIS — I130 Hypertensive heart and chronic kidney disease with heart failure and stage 1 through stage 4 chronic kidney disease, or unspecified chronic kidney disease: Secondary | ICD-10-CM | POA: Diagnosis not present

## 2020-02-02 DIAGNOSIS — Z95810 Presence of automatic (implantable) cardiac defibrillator: Secondary | ICD-10-CM | POA: Diagnosis not present

## 2020-02-02 DIAGNOSIS — Z8673 Personal history of transient ischemic attack (TIA), and cerebral infarction without residual deficits: Secondary | ICD-10-CM | POA: Diagnosis not present

## 2020-02-02 DIAGNOSIS — I5042 Chronic combined systolic (congestive) and diastolic (congestive) heart failure: Secondary | ICD-10-CM | POA: Diagnosis not present

## 2020-02-02 DIAGNOSIS — I071 Rheumatic tricuspid insufficiency: Secondary | ICD-10-CM | POA: Diagnosis not present

## 2020-02-02 LAB — BODY FLUID CULTURE: Culture: NO GROWTH

## 2020-02-03 NOTE — Progress Notes (Deleted)
[place]  [phone]) [phone] [phone]) [phone]  ID: Thomas [name] OB: August 19, 1934  MR#: [id number]  TDD#:[id number]  Patient Care Team: [name], MD as PCP - General (Family Medicine) Sanda Klein, MD as PCP - Cardiology (Cardiology)  CHIEF COMPLAINT: Iron deficiency anemia.   INTERVAL HISTORY: Patient is an 85 year old male who was recently admitted to the hospital with upper GI bleed requiring approximately 4 units of packed red blood cells.  Subsequent work-up revealed AVMs that were subsequently treated with argon laser.  He continues to have chronic weakness and fatigue, but otherwise feels well.  He has no neurologic complaints.  He denies any recent fevers or illnesses.  He has a good appetite and denies weight loss.  He has no chest pain, shortness of breath, cough, or hemoptysis.  He denies any nausea, vomiting, constipation, or diarrhea.  He denies any further melena or hematochezia.  He has no urinary complaints.  Patient offers no further specific complaints today.  REVIEW OF SYSTEMS:   Review of Systems  Constitutional: Positive for malaise/fatigue. Negative for fever and weight loss.  Respiratory: Negative.  Negative for cough, hemoptysis and shortness of breath.   Cardiovascular: Negative.  Negative for chest pain and leg swelling.  Gastrointestinal: Negative.  Negative for abdominal pain, blood in stool and melena.  Genitourinary: Negative.  Negative for frequency.  Musculoskeletal: Negative.  Negative for back pain.  Skin: Negative.  Negative for rash.  Neurological: Positive for weakness. Negative for dizziness, focal weakness and headaches.  Psychiatric/Behavioral: Negative.  The patient is not nervous/anxious.     As per HPI. Otherwise, a complete review of systems is negative.  PAST MEDICAL HISTORY: Past Medical History:  Diagnosis Date  . AICD (automatic cardioverter/defibrillator) present   . Bacteremia due to group B  Streptococcus    Recurrent admissions for group B Strepotococcus bacteremia of unknown source with TEEs negative for vegetation 06/2019 and 08/2019  . Biventricular ICD (implantable cardioverter-defibrillator) in place 03/24/2005   Implantation of a Medtronic Adapta ADDRO1, serial number [id number]  . CHF (congestive heart failure) ([place])   . CKD (chronic kidney disease), stage III ([place])   . Coronary artery disease    a. s/p CABG 1986. b. Multiple PCIs/caths. c. 09/2013: s/p PTCA and BMS to SVG-OM.  [name]    Requires sign language interpreter  . Dysrhythmia   . History of abdominal aortic aneurysm   . History of bleeding peptic ulcer 1980  . History of epididymitis 2013  . HTN (hypertension)   . Hydronephrosis with ureteropelvic junction obstruction   . Hydroureter on left 2009  . Hypertension   . Ischemic cardiomyopathy    a. Prior EF 30-35%, s/p BIV-ICD. b. 09/2013: EF 45-50%.  . Moderate tricuspid regurgitation   . PAF (paroxysmal atrial fibrillation) (H[facility])    Not on [place] 2/2 GIB  . Presence of permanent cardiac pacemaker 2002   Original placed in 2002 for CHB then 2007 and 2014 device change out  . Prostate cancer ([place])   . Status post coronary artery bypass grafting 1986   LIMA to the LAD, SVG to OM, SVG to RCA  . Testicular swelling     PAST SURGICAL HISTORY: Past Surgical History:  Procedure Laterality Date  . 2-D echocardiogram  11/20/2011   Ejection fraction 30-35% moderate concentric left ventricular hypertrophy. Left atrium is moderately dilated. Mild MR. Mild or  . BI-VENTRICULAR IMPLANTABLE CARDIOVERTER DEFIBRILLATOR N/A 12/16/2012   Procedure: BI-VENTRICULAR IMPLANTABLE CARDIOVERTER DEFIBRILLATOR  (CRT-D);  Surgeon: [name], MD;  Location: [facility] CATH LAB;  Service: Cardiovascular;  Laterality: N/A;  . BIV ICD INSERTION CRT-D N/A 09/01/2019   Procedure: BIV ICD INSERTION CRT-D;  Surgeon: [name], MD;  Location: [place] CV LAB;  Service: Cardiovascular;   Laterality: N/A;  . CARDIAC CATHETERIZATION  12/10/2011   SVG to OM widely patent.  LIMA to LAD patent  . CATARACT EXTRACTION W/PHACO Right 10/12/2017   Procedure: CATARACT EXTRACTION PHACO AND INTRAOCULAR LENS PLACEMENT (IOC);  Surgeon: Birder Robson, MD;  Location: [facility] ORS;  Service: Ophthalmology;  Laterality: Right;  [place] 00:57 AP% 15.9 CDE 9.07 Fluid pack lot # [id number]  . COLONOSCOPY N/A 07/13/2018   Procedure: COLONOSCOPY;  Surgeon: Toledo, Benay Pike, MD;  Location: [facility] ENDOSCOPY;  Service: Gastroenterology;  Laterality: N/A;  . CORONARY ARTERY BYPASS GRAFT  1986  . EMBOLIZATION N/A 01/18/2020   Procedure: EMBOLIZATION;  Surgeon: Algernon Huxley, MD;  Location: [place] CV LAB;  Service: Cardiovascular;  Laterality: N/A;  . ENTEROSCOPY N/A 09/14/2018   Procedure: ENTEROSCOPY;  Surgeon: Toledo, Benay Pike, MD;  Location: [facility] ENDOSCOPY;  Service: Gastroenterology;  Laterality: N/A;  symptomatic anemia, GI blood loss anemia, melena, positive small bowel capsule endoscopy showing source of bleeding   . ENTEROSCOPY N/A 08/22/2019   Procedure: ENTEROSCOPY;  Surgeon: Lin Landsman, MD;  Location: [facility] ENDOSCOPY;  Service: Gastroenterology;  Laterality: N/A;  . ESOPHAGOGASTRODUODENOSCOPY N/A 07/13/2018   Procedure: ESOPHAGOGASTRODUODENOSCOPY (EGD);  Surgeon: Toledo, Benay Pike, MD;  Location: [facility] ENDOSCOPY;  Service: Gastroenterology;  Laterality: N/A;  . ESOPHAGOGASTRODUODENOSCOPY N/A 09/14/2018   Procedure: ESOPHAGOGASTRODUODENOSCOPY (EGD);  Surgeon: Toledo, Benay Pike, MD;  Location: [facility] ENDOSCOPY;  Service: Gastroenterology;  Laterality: N/A;  SIGN LANAGUAGE INTERPRETER  . ESOPHAGOGASTRODUODENOSCOPY N/A 06/21/2019   Procedure: ESOPHAGOGASTRODUODENOSCOPY (EGD);  Surgeon: Toledo, Benay Pike, MD;  Location: [facility] ENDOSCOPY;  Service: Gastroenterology;  Laterality: N/A;  . ESOPHAGOGASTRODUODENOSCOPY N/A 01/15/2020   Procedure: ESOPHAGOGASTRODUODENOSCOPY (EGD);  Surgeon: Lucilla Lame, MD;   Location[facility] ENDOSCOPY;  Service: Endoscopy;  Laterality: N/A;  . ESOPHAGOGASTRODUODENOSCOPY (EGD) WITH PROPOFOL N/A 05/27/2018   Procedure: ESOPHAGOGASTRODUODENOSCOPY (EGD) WITH PROPOFOL;  Surgeon: Clarene Essex, MD;  Location: [place];  Service: Endoscopy;  Laterality: N/A;  . ICD LEAD REMOVAL Left 08/25/2019   Procedure: ICD LEAD EXTRACTION;  Surgeon: [name], MD;  Location: [place];  Service: Cardiovascular;  Laterality: Left;  DR. Roxan Hockey BACK UP  . INSERT / REPLACE / REMOVE PACEMAKER    . LEFT HEART CATHETERIZATION WITH CORONARY/GRAFT ANGIOGRAM N/A 12/10/2011   Procedure: LEFT HEART CATHETERIZATION WITH Beatrix Fetters;  Surgeon: Sanda Klein, MD;  Location: [place] CATH LAB;  Service: Cardiovascular;  Laterality: N/A;  . LEFT HEART CATHETERIZATION WITH CORONARY/GRAFT ANGIOGRAM N/A 09/25/2013   Procedure: LEFT HEART CATHETERIZATION WITH Beatrix Fetters;  Surgeon: Blane Ohara, MD;  Location: [facility] CATH LAB;  Service: Cardiovascular;  Laterality: N/A;  . MULTIPLE EXTRACTIONS WITH ALVEOLOPLASTY Bilateral 08/30/2019   Procedure: Extractionof tooth #'s 2, 28, and 31 with alveoloplasty and gross debridement of remaining teeth.;  Surgeon: [name], DDS;  Location: [place];  Service: Oral Surgery;  Laterality: Bilateral;  . Persantine Myoview  05/06/2010   Post-rest ejection fraction 30%. No significant ischemia demonstrated. Compared to previous study there is no significant change.  . TEE WITHOUT CARDIOVERSION N/A 06/22/2019   Procedure: TRANSESOPHAGEAL ECHOCARDIOGRAM (TEE);  Surgeon: Minna Merritts, MD;  Location: [facility] ORS;  Service: Cardiovascular;  Laterality: N/A;  . TEE WITHOUT CARDIOVERSION N/A 08/23/2019  Procedure: TRANSESOPHAGEAL ECHOCARDIOGRAM (TEE);  Surgeon: Kate Sable, MD;  Location: [facility] ORS;  Service: Cardiovascular;  Laterality: N/A;  . TEE WITHOUT CARDIOVERSION N/A 08/25/2019   Procedure: TRANSESOPHAGEAL ECHOCARDIOGRAM (TEE);  Surgeon:  [name], MD;  [facility];  Service: Cardiovascular;  Laterality: N/A;  . TRANSURETHRAL RESECTION OF PROSTATE     s/p    FAMILY HISTORY: Family History  Problem Relation Age of Onset  . Hypertension Father     ADVANCED DIRECTIVES (Y/N):  N  HEALTH MAINTENANCE: Social History   Tobacco Use  . Smoking status: Former Smoker    Quit date: 03/15/1985    Years since quitting: 34.9  . Smokeless tobacco: Never Used  Vaping Use  . Vaping Use: Never used  Substance Use Topics  . Alcohol use: No    Comment: occas.  . Drug use: No     Colonoscopy:  PAP:  Bone density:  Lipid panel:  Allergies  Allergen Reactions  . Entresto [Sacubitril-Valsartan] Swelling    And bruising of arm  . Phenazopyridine Nausea Only and Other (See Comments)    GI UPSET  . Ramipril Other (See Comments)    unk Other reaction(s): Other (See Comments), Unknown unk    Current Outpatient Medications  Medication Sig Dispense Refill  . acetaminophen (TYLENOL) 325 MG tablet Take 2 tablets (650 mg total) by mouth every 6 (six) hours as needed for mild pain or fever.    [name] albuterol (PROVENTIL) (2.5 MG/3ML) 0.083% nebulizer solution Inhale 3 mLs into the lungs every 4 (four) hours as needed for wheezing or shortness of breath. 75 mL 12  . ferrous sulfate 325 (65 FE) MG tablet Take 1 tablet (325 mg total) by mouth 2 (two) times daily with a meal. (Patient taking differently: Take 325 mg by mouth daily with breakfast. ) 60 tablet 0  . losartan (COZAAR) 25 MG tablet Take 25 mg by mouth daily.    . midodrine (PROAMATINE) 5 MG tablet TAKE 1 TABLET (5 MG TOTAL) BY MOUTH 3 (THREE) TIMES DAILY WITH MEALS. 270 tablet 3  . Multiple Vitamin (MULTIVITAMIN WITH MINERALS) TABS tablet Take 1 tablet by mouth daily.    [name] Nystatin (GERHARDT'S BUTT CREAM) CREA Apply 1 application topically 2 (two) times daily. (Patient taking differently: Apply 1 application topically as needed. )    . ondansetron (ZOFRAN) 4 MG  tablet Take 1 tablet (4 mg total) by mouth every 8 (eight) hours as needed for nausea or vomiting. (Patient not taking: Reported on 01/26/2020) 30 tablet 1  . pantoprazole (PROTONIX) 40 MG tablet Take 1 tablet (40 mg total) by mouth 2 (two) times daily. (Patient not taking: Reported on 01/26/2020) 60 tablet 0  . simvastatin (ZOCOR) 20 MG tablet TAKE 1 TABLET BY MOUTH EVERY DAY (Patient taking differently: Take 20 mg by mouth daily at 6 PM. ) 90 tablet 2  . spironolactone (ALDACTONE) 25 MG tablet Take 1 tablet (25 mg total) by mouth daily. 30 tablet 3  . torsemide (DEMADEX) 20 MG tablet Take 2 tablets (40 mg total) by mouth 2 (two) times daily. 240 tablet 3   No current facility-administered medications for this visit.    OBJECTIVE: There were no vitals filed for this visit.   There is no height or weight on file to calculate BMI.    ECOG FS:1 - Symptomatic but completely ambulatory  General: Well-developed, well-nourished, no acute distress. Eyes: Pink conjunctiva, anicteric sclera. HEENT: Normocephalic, moist mucous membranes. Lungs: No audible wheezing or  coughing. Heart: Regular rate and rhythm. Abdomen: Soft, nontender, no obvious distention. Musculoskeletal: No edema, cyanosis, or clubbing. Neuro: Alert, answering all questions appropriately. Cranial nerves grossly intact. Skin: No rashes or petechiae noted. Psych: Normal affect. Lymphatics: No cervical, calvicular, axillary or inguinal LAD.   LAB RESULTS:  Lab Results  Component Value Date   NA 138 01/31/2020   K 3.7 01/31/2020   CL 102 01/31/2020   CO2 28 01/31/2020   GLUCOSE 90 01/31/2020   BUN 13 01/31/2020   CREATININE 1.05 01/31/2020   CALCIUM 7.9 (L) 01/31/2020   PROT 5.2 (L) 01/27/2020   ALBUMIN 2.2 (L) 01/31/2020   AST 24 01/27/2020   ALT 13 01/27/2020   ALKPHOS 101 01/27/2020   BILITOT 0.8 01/27/2020   GFRNONAA >60 01/31/2020   GFRAA 56 (L) 10/24/2019    Lab Results  Component Value Date   WBC 5.6  01/31/2020   NEUTROABS 4.4 01/26/2020   HGB 9.7 (L) 01/31/2020   HCT 30.7 (L) 01/31/2020   MCV 87.5 01/31/2020   PLT 206 01/31/2020   Lab Results  Component Value Date   IRON 52 12/05/2019   TIBC 265 12/05/2019   IRONPCTSAT 20 12/05/2019   Lab Results  Component Value Date   FERRITIN 46 12/05/2019     STUDIES: DG Chest 2 View  Result Date: 01/29/2020 CLINICAL DATA:  Shortness of breath.  Pleural effusion EXAM: CHEST - 2 VIEW COMPARISON:  Chest radiograph and CT angiogram chest January 26 2020 FINDINGS: There are pleural effusions bilaterally, similar to recent studies. There is consolidation in the left lower lobe with patchy infiltrate in the right base. There is cardiomegaly with pulmonary vascular normal. Pacemaker leads are attached to the right ventricle and coronary sinus regions. Patient is status post coronary artery bypass grafting. There is aortic atherosclerosis. No adenopathy. There is degenerative change in each shoulder. IMPRESSION: Persistent cardiomegaly with pacemaker leads attached to right ventricle and coronary sinus. Status post coronary artery bypass grafting. Pleural effusions bilaterally with left lower lobe consolidation and patchy right base infiltrate. Appearance similar to recent studies. Aortic Atherosclerosis (ICD10-I70.0). Electronically Signed   By: Lowella Grip III M.D.   On: 01/29/2020 13:10   CT Angio Chest PE W/Cm &/Or Wo Cm  Result Date: 01/26/2020 CLINICAL DATA:  Hypoxia EXAM: CT ANGIOGRAPHY CHEST WITH CONTRAST TECHNIQUE: Multidetector CT imaging of the chest was performed using the standard protocol during bolus administration of intravenous contrast. Multiplanar CT image reconstructions and MIPs were obtained to evaluate the vascular anatomy. CONTRAST:  60mL OMNIPAQUE IOHEXOL 350 MG/ML SOLN COMPARISON:  CT 05/19/2019 FINDINGS: Cardiovascular: Satisfactory opacification of the pulmonary arteries. No evidence of filling defect to the lobar  branch level. Evaluation is somewhat limited by respiratory motion artifact and extensive streak and beam hardening artifact related to patient's pacemaker. Mid ascending thoracic aorta measures 5.0 cm in diameter (previously 4.8 cm on 05/19/2019). There is tortuosity of the descending thoracic aorta. Atherosclerotic calcification of the aorta and coronary arteries. Moderate four-chamber cardiomegaly. Status post CABG. Mediastinum/Nodes: There are a few mildly prominent mediastinal lymph nodes including index upper right paratracheal node measuring 12 mm (series 6, image 25), previously measured 9 mm. No axillary or hilar lymphadenopathy. Trachea, esophagus, and thyroid gland appear within normal limits. Lungs/Pleura: Moderate bilateral pleural effusions with associated compressive atelectasis. Patchy airspace opacities within the dependent portions of the bilateral lower lobes, right middle lobe, and lingula suspicious for superimposed pneumonia or aspiration. No pneumothorax. Upper Abdomen: Reflux of contrast into the  IVC and hepatic veins. Left hepatic lobe cyst. Musculoskeletal: No chest wall abnormality. No acute or significant osseous findings. Review of the MIP images confirms the above findings. IMPRESSION: 1. Negative for pulmonary embolism to the lobar branch level. 2. Moderate bilateral pleural effusions with associated compressive atelectasis. 3. Patchy airspace opacities within the dependent portions of the bilateral lower lobes, right middle lobe, and lingula suspicious for superimposed pneumonia or aspiration. 4. Mid ascending thoracic aortic aneurysm measuring up to 5.0 cm in diameter (previously 4.8 cm on 05/19/2019). Recommend semi-annual imaging followup by CTA or MRA and referral to cardiothoracic surgery if not already obtained. This recommendation follows 2010 ACCF/AHA/AATS/ACR/ASA/SCA/SCAI/SIR/STS/SVM Guidelines for the Diagnosis and Management of Patients With Thoracic Aortic Disease.  Circulation. 2010; 121: E315-Q008. Aortic aneurysm NOS (ICD10-I71.9) 5. Cardiomegaly with evidence of right heart dysfunction. 6. Aortic and coronary artery atherosclerosis.  (ICD10-I70.0). Electronically Signed   By: Davina Poke D.O.   On: 01/26/2020 13:56   PERIPHERAL VASCULAR CATHETERIZATION  Result Date: 01/18/2020 See op note  DG Chest Port 1 View  Result Date: 01/30/2020 CLINICAL DATA:  Status post left thoracentesis. EXAM: PORTABLE CHEST 1 VIEW COMPARISON:  Yesterday. FINDINGS: Significant decrease in left pleural fluid and associated left basilar atelectasis. Mild increase in size of a small right pleural effusion. No pneumothorax. Enlarged cardiac silhouette without significant change. Post CABG changes and right subclavian pacer and AICD leads are again demonstrated. Stable prominence of the interstitial markings. Thoracic spine and right shoulder degenerative changes. IMPRESSION: 1. No pneumothorax following left thoracentesis. 2. Mild increase in size of a small right pleural effusion. 3. Stable cardiomegaly and chronic interstitial lung disease with possible mild superimposed interstitial pulmonary edema. 4. Decreased left basilar atelectasis. Electronically Signed   By: Claudie Revering M.D.   On: 01/30/2020 11:26   DG Chest Port 1 View  Result Date: 01/29/2020 CLINICAL DATA:  Status post thoracentesis EXAM: PORTABLE CHEST 1 VIEW COMPARISON:  01/29/2020, 12:30 p.m. FINDINGS: Interval decrease in volume of a right-sided pleural effusion, now small. Persistent left pleural effusion and or retrocardiac atelectasis. No pneumothorax. Diffuse interstitial pulmonary opacity, consistent with edema. Cardiomegaly with right chest multi lead pacer defibrillator. IMPRESSION: 1. Interval decrease in volume of a right-sided pleural effusion, now small. 2. No pneumothorax. 3. Persistent left pleural effusion and or retrocardiac atelectasis. 4. Pulmonary edema. 5. Cardiomegaly. Electronically Signed    By: Eddie Candle M.D.   On: 01/29/2020 17:05   DG Chest Port 1 View  Result Date: 01/26/2020 CLINICAL DATA:  Shortness of breath. EXAM: PORTABLE CHEST 1 VIEW COMPARISON:  September 12, 2019. FINDINGS: Stable cardiomegaly. Right-sided pacemaker is unchanged in position. Central pulmonary vascular congestion is noted. No pneumothorax is noted. Stable bibasilar opacities are noted concerning for edema or atelectasis with associated pleural effusions. Bony thorax is unremarkable. IMPRESSION: Stable cardiomegaly and central pulmonary vascular congestion. Stable bibasilar opacities are noted concerning for edema or atelectasis with associated pleural effusions. Aortic Atherosclerosis (ICD10-I70.0). Electronically Signed   By: Marijo Conception M.D.   On: 01/26/2020 10:00   ECHOCARDIOGRAM COMPLETE  Result Date: 01/28/2020    ECHOCARDIOGRAM REPORT   Patient Name:   ZYIERE ROSEMOND Date of Exam: 01/28/2020 Medical Rec #:  [id number]      Height:       70.0 in Accession #:    [id number]     Weight:       176.1 lb Date of Birth:  [birth year]     BSA:  1.978 m Patient Age:    17 years       BP:           100/50 mmHg Patient Gender: M              HR:           73 bpm. Exam Location:  [facility] Procedure: 2D Echo, Cardiac Doppler and Color Doppler Indications:     R06.02 SOB; I48.2 Chronic atrial fibrillation; I25.110                  Atherosclerotic heart disease of native coronary artery with                  unstable angina pectoris; I50.33 Acute on chronic diastolic                  (congestive) heart failure  History:         Patient has prior history of Echocardiogram examinations. CHF                  and LV DysFx, s/p Cardiac Surgery, CAD, Arrythmias:Atrial                  Fibrillation; Signs/Symptoms:Shortness of Breath.  Sonographer:     Arville Go Referring Phys:  [place] Phys: Ida Rogue MD IMPRESSIONS  1. Left ventricular ejection fraction, by estimation, is 40 to 45%. The left  ventricle has mildly decreased function. The left ventricle demonstrates regional wall motion abnormalities (Challenging images, though there is concern for inferior/posterior hypokinesis). The left ventricular internal cavity size was moderately dilated. Left ventricular diastolic parameters are indeterminate.  2. Right ventricular systolic function is normal. The right ventricular size is moderately enlarged. There is moderately elevated pulmonary artery systolic pressure. The estimated right ventricular systolic pressure is 81.0 mmHg.  3. Left atrial size was moderately dilated.  4. The mitral valve is normal in structure. Moderate mitral valve regurgitation.  5. Tricuspid valve regurgitation is moderate to severe.  6. Aortic valve regurgitation is mild.  7. The inferior vena cava is dilated in size with <50% respiratory variability, suggesting right atrial pressure of 15 mmHg.  8. Large left pleural effusion noted FINDINGS  Left Ventricle: Left ventricular ejection fraction, by estimation, is 40 to 45%. The left ventricle has mildly decreased function. The left ventricle demonstrates regional wall motion abnormalities. The left ventricular internal cavity size was moderately dilated. There is no left ventricular hypertrophy. Left ventricular diastolic parameters are indeterminate. Right Ventricle: The right ventricular size is moderately enlarged. No increase in right ventricular wall thickness. Right ventricular systolic function is normal. There is moderately elevated pulmonary artery systolic pressure. The tricuspid regurgitant  velocity is 3.05 m/s, and with an assumed right atrial pressure of 20 mmHg, the estimated right ventricular systolic pressure is 17.5 mmHg. Left Atrium: Left atrial size was moderately dilated. Right Atrium: Right atrial size was normal in size. Pericardium: There is no evidence of pericardial effusion. Mitral Valve: The mitral valve is normal in structure. Moderate mitral valve  regurgitation, with eccentric laterally directed jet. No evidence of mitral valve stenosis. Tricuspid Valve: The tricuspid valve is normal in structure. Tricuspid valve regurgitation is moderate to severe. No evidence of tricuspid stenosis. Aortic Valve: The aortic valve was not well visualized. Aortic valve regurgitation is mild. No aortic stenosis is present. Aortic valve peak gradient measures 9.5 mmHg. Pulmonic Valve: The pulmonic valve was normal in structure. Pulmonic valve  regurgitation is not visualized. No evidence of pulmonic stenosis. Aorta: The aortic root is normal in size and structure. Venous: The inferior vena cava is dilated in size with less than 50% respiratory variability, suggesting right atrial pressure of 15 mmHg. IAS/Shunts: No atrial level shunt detected by color flow Doppler. Additional Comments: A pacer wire is visualized.  LEFT VENTRICLE PLAX 2D LVIDd:         6.46 cm      Diastology LVIDs:         5.14 cm      LV e' medial:    8.27 cm/s LV PW:         1.13 cm      LV E/e' medial:  16.4 LV IVS:        1.02 cm      LV e' lateral:   9.68 cm/s LVOT diam:     2.20 cm      LV E/e' lateral: 14.0 LV SV:         92 LV SV Index:   47 LVOT Area:     3.80 cm  LV Volumes (MOD) LV vol d, MOD A2C: 218.0 ml LV vol s, MOD A2C: 106.0 ml LV SV MOD A2C:     112.0 ml RIGHT VENTRICLE RV Basal diam:  4.54 cm RV S prime:     10.30 cm/s TAPSE (M-mode): 2.5 cm LEFT ATRIUM              Index        RIGHT ATRIUM           Index LA diam:        6.00 cm  3.03 cm/m   RA Area:     39.50 cm LA Vol (A2C):   205.0 ml 103.66 ml/m RA Volume:   165.00 ml 83.44 ml/m LA Vol (A4C):   208.0 ml 105.18 ml/m LA Biplane Vol: 228.0 ml 115.29 ml/m  AORTIC VALVE                PULMONIC VALVE AV Area (Vmax): 3.58 cm    PV Vmax:       1.11 m/s AV Vmax:        154.00 cm/s PV Peak grad:  4.9 mmHg AV Peak Grad:   9.5 mmHg LVOT Vmax:      145.00 cm/s LVOT Vmean:     95.200 cm/s LVOT VTI:       0.243 m  AORTA Ao Root diam: 3.80 cm Ao  Asc diam:  3.60 cm MITRAL VALVE                TRICUSPID VALVE MV Area (PHT): 4.31 cm     TV Peak grad:   34.0 mmHg MV Decel Time: 176 msec     TV Vmax:        2.92 m/s MV E velocity: 136.00 cm/s  TR Peak grad:   37.2 mmHg                             TR Vmax:        305.00 cm/s                              SHUNTS  Systemic VTI:  0.24 m                             Systemic Diam: 2.20 cm Ida Rogue MD Electronically signed by Ida Rogue MD Signature Date/Time: 01/28/2020/4:02:34 PM    Final    CT Angio Abd/Pel w/ and/or w/o  Result Date: 01/15/2020 CLINICAL DATA:  Acute nonlocalized abdominal pain.  GI bleeding EXAM: CTA ABDOMEN AND PELVIS WITHOUT AND WITH CONTRAST TECHNIQUE: Multidetector CT imaging of the abdomen and pelvis was performed using the standard protocol during bolus administration of intravenous contrast. Multiplanar reconstructed images and MIPs were obtained and reviewed to evaluate the vascular anatomy. CONTRAST:  164mL OMNIPAQUE IOHEXOL 350 MG/ML SOLN COMPARISON:  06/17/2019 abdomen and pelvis CT without contrast FINDINGS: VASCULAR Aorta: Stent grafting of the aorta and iliacs. No high-density hematoma seen within the wall and noncontrast phase. Well-opposed stent graft to the infrarenal aorta. No adjacent fat stranding seen. Celiac: Heavily calcified branches from atherosclerosis. No acute finding. Proximal narrowing is approximately 50%. SMA: Diffuse atheromatous plaque. No branch occlusion or dissection seen. Renals: Accessory upper pole renal artery on the left. The renal arteries are heavily calcified. No aneurysm or occlusion seen. No detected beading. IMA: Poorly enhancing, being covered by the stent graft Inflow: Heavily calcified on both sides. 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 No significant change from prior. No superimposed dissection. Proximal Outflow: Diffuse atheromatous calcification Veins:  No portal or systemic venous occlusion is seen. 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 Review of the MIP images confirms the above findings. NON-VASCULAR Lower chest: At least moderate bilateral pleural effusion with atelectasis. The heart is enlarged and there is a biventricular pacer. Hepatobiliary: 2.8 cm cystic density mass in the left lobe liver. No visible gallbladder inflammation or calcified stone Pancreas: Fat stranding and expansion around the pancreatic head. Spleen: Granulomatous calcifications Adrenals/Urinary Tract: Negative adrenals. Patchy right renal cortical scarring which is extensive. Renal hilar calcifications are atheromatous. Unremarkable bladder. Stomach/Bowel: No intraluminal hemorrhage is seen when accounting for intrinsic high-density bowel contents at the distal colon. Inflammation around the mid duodenum. There are a few colonic diverticula Lymphatic: Negative for adenopathy or mass Reproductive: Enlarged prostate, asymmetric to the left, unchanged. Fiducial markers are present in the prostate. Other: No ascites or pneumoperitoneum Musculoskeletal: Degenerative disease without acute finding IMPRESSION: VASCULAR 1. No emergent finding. 2. Aortoiliac stent grafting. 3.  Aortic Atherosclerosis (ICD10-I70.0). NON-VASCULAR 1. Inflammation at the pancreaticoduodenal groove which could be from pancreatitis or peptic ulcer disease, please correlate with labs. 2. Chronic bilateral pleural effusion with atelectasis. 3. 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 4. Extensive right renal cortical scarring. Electronically Signed   By: Monte Fantasia M.D.   On: 01/15/2020 05:00   US THORACENTESIS ASP PLEURAL SPACE W/IMG GUIDE  Result Date: 01/30/2020 INDICATION: Patient history of congestive heart failure, bilateral pleural effusions status post right thoracentesis yesterday who presents to IR today for therapeutic left thoracentesis. EXAM: ULTRASOUND GUIDED LEFT  THORACENTESIS MEDICATIONS: 13 mL 1% lidocaine COMPLICATIONS: None immediate. PROCEDURE: An ultrasound guided thoracentesis was thoroughly discussed with the patient and questions answered. The benefits, risks, alternatives and complications were also discussed. The patient understands and wishes to proceed with the procedure. Written consent was obtained. Ultrasound was performed to localize and mark an adequate pocket of fluid in the left chest. The area was then prepped and draped in the normal sterile fashion.  1% Lidocaine was used for local anesthesia. Under ultrasound guidance a 6 Fr Safe-T-Centesis catheter was introduced. Thoracentesis was performed. The catheter was removed and a dressing applied. FINDINGS: A total of approximately 1.3 L of clear yellow fluid was removed. IMPRESSION: Successful ultrasound guided left thoracentesis yielding 1.3 L of pleural fluid. Read by Candiss Norse, PA-C Electronically Signed   By: Markus Daft M.D.   On: 01/30/2020 11:06   US THORACENTESIS ASP PLEURAL SPACE W/IMG GUIDE  Result Date: 01/30/2020 INDICATION: Patient with history of congestive heart failure, bilateral pleural. Request is made diagnostic and therapeutic right thoracentesis. EXAM: ULTRASOUND GUIDED DIAGNOSTIC AND THERAPEUTIC RIGHT THORACENTESIS MEDICATIONS: 35mL 1% lidocaine COMPLICATIONS: None immediate.  No pneumothorax on follow-up chest radiograph. PROCEDURE: An ultrasound guided thoracentesis was thoroughly discussed with the patient and questions answered. The benefits, risks, alternatives and complications were also discussed. The patient understands and wishes to proceed with the procedure. Written consent was obtained. Ultrasound was performed to localize and mark an adequate pocket of fluid in the right chest. The area was then prepped and draped in the normal sterile fashion. 1% Lidocaine was used for local anesthesia. Under ultrasound guidance a 6 Fr Safe-T-Centesis catheter was introduced.  Thoracentesis was performed. The catheter was removed and a dressing applied. FINDINGS: A total of approximately 1.2 liters of amber fluid was removed. Samples were sent to the laboratory as requested by the clinical team. IMPRESSION: Successful ultrasound guided diagnostic and therapeutic right thoracentesis yielding 1.2 liters of pleural fluid. Read by: Brynda Greathouse PA-C Electronically Signed   By: Lucrezia Europe M.D.   On: 01/29/2020 16:47    ASSESSMENT: Iron deficiency anemia.   PLAN:    1. Iron deficiency anemia: Secondary to GI bleed.  Patient underwent endoscopy in June 2021 revealed multiple AVMs that were treated with argon laser.  He received 4 units of packed red blood cells at that time.  His hemoglobin significantly improved.  Today's result is 8.9 which is essentially unchanged from previous.  Though his iron stores are within normal limits, this may be artificially elevated secondary to recent blood transfusions.  The remainder of her laboratory work is either negative or within normal limits.  Patient will return to clinic in 1, 2, and 3 weeks to receive 200 mg IV Venofer.  Patient will then return to clinic in 2 months with repeat laboratory work, further evaluation, and continuation of treatment if needed.  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  Patient expressed understanding and was in agreement with this plan. He also understands that He can call clinic at any time with any questions, concerns, or complaints.    Thomas Huger, MD   02/03/2020 6:37 PM

## 2020-02-05 ENCOUNTER — Inpatient Hospital Stay: Payer: PPO

## 2020-02-05 ENCOUNTER — Inpatient Hospital Stay: Payer: PPO | Admitting: Oncology

## 2020-02-05 DIAGNOSIS — D509 Iron deficiency anemia, unspecified: Secondary | ICD-10-CM

## 2020-02-06 ENCOUNTER — Emergency Department: Payer: PPO

## 2020-02-06 ENCOUNTER — Inpatient Hospital Stay: Payer: PPO | Admitting: Anesthesiology

## 2020-02-06 ENCOUNTER — Other Ambulatory Visit: Payer: Self-pay

## 2020-02-06 ENCOUNTER — Encounter: Admission: EM | Disposition: E | Payer: Self-pay | Source: Home / Self Care | Attending: Surgery

## 2020-02-06 ENCOUNTER — Inpatient Hospital Stay
Admission: EM | Admit: 2020-02-06 | Discharge: 2020-03-16 | DRG: 326 | Disposition: E | Payer: PPO | Attending: Surgery | Admitting: Surgery

## 2020-02-06 DIAGNOSIS — J9811 Atelectasis: Secondary | ICD-10-CM | POA: Diagnosis not present

## 2020-02-06 DIAGNOSIS — R1013 Epigastric pain: Secondary | ICD-10-CM | POA: Diagnosis present

## 2020-02-06 DIAGNOSIS — K653 Choleperitonitis: Secondary | ICD-10-CM

## 2020-02-06 DIAGNOSIS — N261 Atrophy of kidney (terminal): Secondary | ICD-10-CM | POA: Diagnosis not present

## 2020-02-06 DIAGNOSIS — K265 Chronic or unspecified duodenal ulcer with perforation: Secondary | ICD-10-CM | POA: Diagnosis not present

## 2020-02-06 DIAGNOSIS — R188 Other ascites: Secondary | ICD-10-CM | POA: Diagnosis present

## 2020-02-06 DIAGNOSIS — K266 Chronic or unspecified duodenal ulcer with both hemorrhage and perforation: Principal | ICD-10-CM | POA: Diagnosis present

## 2020-02-06 DIAGNOSIS — I10 Essential (primary) hypertension: Secondary | ICD-10-CM | POA: Diagnosis present

## 2020-02-06 DIAGNOSIS — N183 Chronic kidney disease, stage 3 unspecified: Secondary | ICD-10-CM | POA: Diagnosis not present

## 2020-02-06 DIAGNOSIS — R54 Age-related physical debility: Secondary | ICD-10-CM | POA: Diagnosis present

## 2020-02-06 DIAGNOSIS — I358 Other nonrheumatic aortic valve disorders: Secondary | ICD-10-CM | POA: Diagnosis not present

## 2020-02-06 DIAGNOSIS — Z20822 Contact with and (suspected) exposure to covid-19: Secondary | ICD-10-CM | POA: Diagnosis not present

## 2020-02-06 DIAGNOSIS — Z888 Allergy status to other drugs, medicaments and biological substances status: Secondary | ICD-10-CM

## 2020-02-06 DIAGNOSIS — E782 Mixed hyperlipidemia: Secondary | ICD-10-CM

## 2020-02-06 DIAGNOSIS — K275 Chronic or unspecified peptic ulcer, site unspecified, with perforation: Secondary | ICD-10-CM

## 2020-02-06 DIAGNOSIS — R64 Cachexia: Secondary | ICD-10-CM | POA: Diagnosis not present

## 2020-02-06 DIAGNOSIS — R0902 Hypoxemia: Secondary | ICD-10-CM | POA: Diagnosis not present

## 2020-02-06 DIAGNOSIS — I4821 Permanent atrial fibrillation: Secondary | ICD-10-CM | POA: Diagnosis not present

## 2020-02-06 DIAGNOSIS — E860 Dehydration: Secondary | ICD-10-CM | POA: Diagnosis not present

## 2020-02-06 DIAGNOSIS — I441 Atrioventricular block, second degree: Secondary | ICD-10-CM | POA: Diagnosis present

## 2020-02-06 DIAGNOSIS — N1831 Chronic kidney disease, stage 3a: Secondary | ICD-10-CM | POA: Diagnosis not present

## 2020-02-06 DIAGNOSIS — Z95810 Presence of automatic (implantable) cardiac defibrillator: Secondary | ICD-10-CM

## 2020-02-06 DIAGNOSIS — Z66 Do not resuscitate: Secondary | ICD-10-CM | POA: Diagnosis not present

## 2020-02-06 DIAGNOSIS — Z8249 Family history of ischemic heart disease and other diseases of the circulatory system: Secondary | ICD-10-CM

## 2020-02-06 DIAGNOSIS — Z8679 Personal history of other diseases of the circulatory system: Secondary | ICD-10-CM

## 2020-02-06 DIAGNOSIS — Z434 Encounter for attention to other artificial openings of digestive tract: Secondary | ICD-10-CM | POA: Diagnosis not present

## 2020-02-06 DIAGNOSIS — I739 Peripheral vascular disease, unspecified: Secondary | ICD-10-CM | POA: Diagnosis present

## 2020-02-06 DIAGNOSIS — E7800 Pure hypercholesterolemia, unspecified: Secondary | ICD-10-CM | POA: Diagnosis present

## 2020-02-06 DIAGNOSIS — Z9861 Coronary angioplasty status: Secondary | ICD-10-CM

## 2020-02-06 DIAGNOSIS — I5042 Chronic combined systolic (congestive) and diastolic (congestive) heart failure: Secondary | ICD-10-CM | POA: Diagnosis present

## 2020-02-06 DIAGNOSIS — E870 Hyperosmolality and hypernatremia: Secondary | ICD-10-CM

## 2020-02-06 DIAGNOSIS — K7460 Unspecified cirrhosis of liver: Secondary | ICD-10-CM | POA: Diagnosis present

## 2020-02-06 DIAGNOSIS — I25119 Atherosclerotic heart disease of native coronary artery with unspecified angina pectoris: Secondary | ICD-10-CM | POA: Diagnosis not present

## 2020-02-06 DIAGNOSIS — K6389 Other specified diseases of intestine: Secondary | ICD-10-CM | POA: Diagnosis not present

## 2020-02-06 DIAGNOSIS — N179 Acute kidney failure, unspecified: Secondary | ICD-10-CM | POA: Diagnosis not present

## 2020-02-06 DIAGNOSIS — I25708 Atherosclerosis of coronary artery bypass graft(s), unspecified, with other forms of angina pectoris: Secondary | ICD-10-CM

## 2020-02-06 DIAGNOSIS — Z980 Intestinal bypass and anastomosis status: Secondary | ICD-10-CM

## 2020-02-06 DIAGNOSIS — I130 Hypertensive heart and chronic kidney disease with heart failure and stage 1 through stage 4 chronic kidney disease, or unspecified chronic kidney disease: Secondary | ICD-10-CM | POA: Diagnosis present

## 2020-02-06 DIAGNOSIS — Z5331 Laparoscopic surgical procedure converted to open procedure: Secondary | ICD-10-CM

## 2020-02-06 DIAGNOSIS — K659 Peritonitis, unspecified: Secondary | ICD-10-CM

## 2020-02-06 DIAGNOSIS — H9193 Unspecified hearing loss, bilateral: Secondary | ICD-10-CM | POA: Diagnosis not present

## 2020-02-06 DIAGNOSIS — I255 Ischemic cardiomyopathy: Secondary | ICD-10-CM

## 2020-02-06 DIAGNOSIS — I2510 Atherosclerotic heart disease of native coronary artery without angina pectoris: Secondary | ICD-10-CM

## 2020-02-06 DIAGNOSIS — K668 Other specified disorders of peritoneum: Secondary | ICD-10-CM | POA: Diagnosis not present

## 2020-02-06 DIAGNOSIS — Z951 Presence of aortocoronary bypass graft: Secondary | ICD-10-CM | POA: Diagnosis not present

## 2020-02-06 DIAGNOSIS — K822 Perforation of gallbladder: Secondary | ICD-10-CM

## 2020-02-06 DIAGNOSIS — K269 Duodenal ulcer, unspecified as acute or chronic, without hemorrhage or perforation: Secondary | ICD-10-CM | POA: Diagnosis not present

## 2020-02-06 DIAGNOSIS — Z8673 Personal history of transient ischemic attack (TIA), and cerebral infarction without residual deficits: Secondary | ICD-10-CM

## 2020-02-06 DIAGNOSIS — Z7189 Other specified counseling: Secondary | ICD-10-CM | POA: Diagnosis not present

## 2020-02-06 DIAGNOSIS — R1030 Lower abdominal pain, unspecified: Secondary | ICD-10-CM | POA: Diagnosis not present

## 2020-02-06 DIAGNOSIS — E43 Unspecified severe protein-calorie malnutrition: Secondary | ICD-10-CM | POA: Insufficient documentation

## 2020-02-06 DIAGNOSIS — N200 Calculus of kidney: Secondary | ICD-10-CM | POA: Diagnosis present

## 2020-02-06 DIAGNOSIS — I081 Rheumatic disorders of both mitral and tricuspid valves: Secondary | ICD-10-CM | POA: Diagnosis not present

## 2020-02-06 DIAGNOSIS — I509 Heart failure, unspecified: Secondary | ICD-10-CM | POA: Diagnosis not present

## 2020-02-06 DIAGNOSIS — Z515 Encounter for palliative care: Secondary | ICD-10-CM | POA: Diagnosis not present

## 2020-02-06 DIAGNOSIS — D7389 Other diseases of spleen: Secondary | ICD-10-CM | POA: Diagnosis not present

## 2020-02-06 DIAGNOSIS — I252 Old myocardial infarction: Secondary | ICD-10-CM

## 2020-02-06 DIAGNOSIS — R58 Hemorrhage, not elsewhere classified: Secondary | ICD-10-CM | POA: Diagnosis not present

## 2020-02-06 DIAGNOSIS — N471 Phimosis: Secondary | ICD-10-CM | POA: Diagnosis present

## 2020-02-06 DIAGNOSIS — Z6821 Body mass index (BMI) 21.0-21.9, adult: Secondary | ICD-10-CM

## 2020-02-06 DIAGNOSIS — I5043 Acute on chronic combined systolic (congestive) and diastolic (congestive) heart failure: Secondary | ICD-10-CM | POA: Diagnosis not present

## 2020-02-06 DIAGNOSIS — K5730 Diverticulosis of large intestine without perforation or abscess without bleeding: Secondary | ICD-10-CM | POA: Diagnosis present

## 2020-02-06 DIAGNOSIS — R109 Unspecified abdominal pain: Secondary | ICD-10-CM | POA: Diagnosis not present

## 2020-02-06 DIAGNOSIS — Z8546 Personal history of malignant neoplasm of prostate: Secondary | ICD-10-CM

## 2020-02-06 DIAGNOSIS — I700 Atherosclerosis of aorta: Secondary | ICD-10-CM | POA: Diagnosis present

## 2020-02-06 DIAGNOSIS — E876 Hypokalemia: Secondary | ICD-10-CM | POA: Diagnosis not present

## 2020-02-06 DIAGNOSIS — Z87891 Personal history of nicotine dependence: Secondary | ICD-10-CM

## 2020-02-06 DIAGNOSIS — R1084 Generalized abdominal pain: Secondary | ICD-10-CM | POA: Diagnosis not present

## 2020-02-06 DIAGNOSIS — I959 Hypotension, unspecified: Secondary | ICD-10-CM | POA: Diagnosis not present

## 2020-02-06 HISTORY — PX: CHOLECYSTECTOMY: SHX55

## 2020-02-06 HISTORY — PX: XI ROBOT ASSISTED DIAGNOSTIC LAPAROSCOPY: SHX6815

## 2020-02-06 HISTORY — PX: LAPAROTOMY: SHX154

## 2020-02-06 LAB — COMPREHENSIVE METABOLIC PANEL
ALT: 13 U/L (ref 0–44)
AST: 26 U/L (ref 15–41)
Albumin: 2.8 g/dL — ABNORMAL LOW (ref 3.5–5.0)
Alkaline Phosphatase: 138 U/L — ABNORMAL HIGH (ref 38–126)
Anion gap: 9 (ref 5–15)
BUN: 16 mg/dL (ref 8–23)
CO2: 31 mmol/L (ref 22–32)
Calcium: 9.2 mg/dL (ref 8.9–10.3)
Chloride: 98 mmol/L (ref 98–111)
Creatinine, Ser: 1.12 mg/dL (ref 0.61–1.24)
GFR, Estimated: >60 mL/min (ref >60)
Glucose, Bld: 109 mg/dL — ABNORMAL HIGH (ref 70–99)
Potassium: 4.7 mmol/L (ref 3.5–5.1)
Sodium: 138 mmol/L (ref 135–145)
Total Bilirubin: 2.2 mg/dL — ABNORMAL HIGH (ref 0.3–1.2)
Total Protein: 6.5 g/dL (ref 6.5–8.1)

## 2020-02-06 LAB — URINALYSIS, COMPLETE (UACMP) WITH MICROSCOPIC
Bacteria, UA: NONE SEEN
Bilirubin Urine: NEGATIVE
Glucose, UA: NEGATIVE mg/dL
Hgb urine dipstick: NEGATIVE
Ketones, ur: NEGATIVE mg/dL
Leukocytes,Ua: NEGATIVE
Nitrite: NEGATIVE
Protein, ur: 30 mg/dL — AB
Specific Gravity, Urine: 1.039 — ABNORMAL HIGH (ref 1.005–1.030)
pH: 5 (ref 5.0–8.0)

## 2020-02-06 LAB — PROTIME-INR
INR: 1.4 — ABNORMAL HIGH (ref 0.8–1.2)
Prothrombin Time: 16.9 seconds — ABNORMAL HIGH (ref 11.4–15.2)

## 2020-02-06 LAB — CBC WITH DIFFERENTIAL/PLATELET
Abs Immature Granulocytes: 0.06 10*3/uL (ref 0.00–0.07)
Basophils Absolute: 0 10*3/uL (ref 0.0–0.1)
Basophils Relative: 0 %
Eosinophils Absolute: 0 10*3/uL (ref 0.0–0.5)
Eosinophils Relative: 0 %
HCT: 33.4 % — ABNORMAL LOW (ref 39.0–52.0)
Hemoglobin: 10.3 g/dL — ABNORMAL LOW (ref 13.0–17.0)
Immature Granulocytes: 1 %
Lymphocytes Relative: 6 %
Lymphs Abs: 0.5 10*3/uL — ABNORMAL LOW (ref 0.7–4.0)
MCH: 27.3 pg (ref 26.0–34.0)
MCHC: 30.8 g/dL (ref 30.0–36.0)
MCV: 88.6 fL (ref 80.0–100.0)
Monocytes Absolute: 0.3 10*3/uL (ref 0.1–1.0)
Monocytes Relative: 5 %
Neutro Abs: 6.1 10*3/uL (ref 1.7–7.7)
Neutrophils Relative %: 88 %
Platelets: 287 10*3/uL (ref 150–400)
RBC: 3.77 MIL/uL — ABNORMAL LOW (ref 4.22–5.81)
RDW: 19.1 % — ABNORMAL HIGH (ref 11.5–15.5)
WBC: 7 10*3/uL (ref 4.0–10.5)
nRBC: 0 % (ref 0.0–0.2)

## 2020-02-06 LAB — RESP PANEL BY RT-PCR (FLU A&B, COVID) ARPGX2
Influenza A by PCR: NEGATIVE
Influenza B by PCR: NEGATIVE
SARS Coronavirus 2 by RT PCR: NEGATIVE

## 2020-02-06 LAB — PREPARE RBC (CROSSMATCH)

## 2020-02-06 LAB — MRSA PCR SCREENING: MRSA by PCR: NEGATIVE

## 2020-02-06 LAB — LACTIC ACID, PLASMA: Lactic Acid, Venous: 1.6 mmol/L (ref 0.5–1.9)

## 2020-02-06 LAB — GLUCOSE, CAPILLARY: Glucose-Capillary: 145 mg/dL — ABNORMAL HIGH (ref 70–99)

## 2020-02-06 LAB — LIPASE, BLOOD: Lipase: 30 U/L (ref 11–51)

## 2020-02-06 LAB — APTT: aPTT: 32 seconds (ref 24–36)

## 2020-02-06 SURGERY — LAPAROSCOPY, DIAGNOSTIC, ROBOT-ASSISTED
Anesthesia: General

## 2020-02-06 MED ORDER — LIDOCAINE HCL (PF) 2 % IJ SOLN
INTRAMUSCULAR | Status: AC
  Filled 2020-02-06: qty 5

## 2020-02-06 MED ORDER — ACETAMINOPHEN 500 MG PO TABS
1000.0000 mg | ORAL_TABLET | Freq: Once | ORAL | Status: AC
  Administered 2020-02-06: 1000 mg via ORAL
  Filled 2020-02-06: qty 2

## 2020-02-06 MED ORDER — OXYCODONE HCL 5 MG/5ML PO SOLN: 5.0000 mg | Freq: Once | ORAL | Status: DC | PRN

## 2020-02-06 MED ORDER — SODIUM CHLORIDE 0.9 % IV SOLN: INTRAVENOUS | Status: DC

## 2020-02-06 MED ORDER — FENTANYL CITRATE (PF) 100 MCG/2ML IJ SOLN
50.0000 ug | Freq: Once | INTRAMUSCULAR | Status: AC
  Administered 2020-02-06: 50 ug via INTRAVENOUS
  Filled 2020-02-06: qty 2

## 2020-02-06 MED ORDER — LIDOCAINE HCL (CARDIAC) PF 100 MG/5ML IV SOSY
PREFILLED_SYRINGE | INTRAVENOUS | Status: DC | PRN
  Administered 2020-02-06: 80 mg via INTRAVENOUS

## 2020-02-06 MED ORDER — FENTANYL CITRATE (PF) 100 MCG/2ML IJ SOLN
INTRAMUSCULAR | Status: DC | PRN
  Administered 2020-02-06 (×2): 50 ug via INTRAVENOUS

## 2020-02-06 MED ORDER — KETAMINE HCL 10 MG/ML IJ SOLN
INTRAMUSCULAR | Status: DC | PRN
  Administered 2020-02-06: 20 mg via INTRAVENOUS
  Administered 2020-02-06 (×2): 10 mg via INTRAVENOUS

## 2020-02-06 MED ORDER — FUROSEMIDE 10 MG/ML IJ SOLN
40.0000 mg | Freq: Once | INTRAMUSCULAR | Status: AC
  Administered 2020-02-07: 40 mg via INTRAVENOUS
  Filled 2020-02-06: qty 4

## 2020-02-06 MED ORDER — PROMETHAZINE HCL 25 MG/ML IJ SOLN: 6.2500 mg | INTRAMUSCULAR | Status: DC | PRN

## 2020-02-06 MED ORDER — PROPOFOL 10 MG/ML IV BOLUS
INTRAVENOUS | Status: DC | PRN
  Administered 2020-02-06: 100 mg via INTRAVENOUS

## 2020-02-06 MED ORDER — FUROSEMIDE 10 MG/ML IJ SOLN
40.0000 mg | Freq: Two times a day (BID) | INTRAMUSCULAR | Status: DC
  Administered 2020-02-07: 40 mg via INTRAVENOUS
  Filled 2020-02-06: qty 4

## 2020-02-06 MED ORDER — METHYLENE BLUE 0.5 % INJ SOLN
INTRAVENOUS | Status: DC | PRN
  Administered 2020-02-06: 10 mg

## 2020-02-06 MED ORDER — PHENYLEPHRINE HCL (PRESSORS) 10 MG/ML IV SOLN
INTRAVENOUS | Status: DC | PRN
  Administered 2020-02-06 (×2): 150 ug via INTRAVENOUS
  Administered 2020-02-06: 200 ug via INTRAVENOUS

## 2020-02-06 MED ORDER — ACETAMINOPHEN 10 MG/ML IV SOLN
INTRAVENOUS | Status: AC
  Filled 2020-02-06: qty 100

## 2020-02-06 MED ORDER — SODIUM CHLORIDE 0.9 % IV SOLN
INTRAVENOUS | Status: DC | PRN
  Administered 2020-02-06: 60 mL

## 2020-02-06 MED ORDER — DEXAMETHASONE SODIUM PHOSPHATE 10 MG/ML IJ SOLN
INTRAMUSCULAR | Status: DC | PRN
  Administered 2020-02-06: 5 mg via INTRAVENOUS

## 2020-02-06 MED ORDER — VISTASEAL 10 ML SINGLE DOSE KIT
PACK | CUTANEOUS | Status: DC | PRN
  Administered 2020-02-06: 10 mL via TOPICAL

## 2020-02-06 MED ORDER — BUPIVACAINE-EPINEPHRINE (PF) 0.25% -1:200000 IJ SOLN
INTRAMUSCULAR | Status: DC | PRN
  Administered 2020-02-06: 30 mL

## 2020-02-06 MED ORDER — FENTANYL CITRATE (PF) 100 MCG/2ML IJ SOLN
INTRAMUSCULAR | Status: AC
  Filled 2020-02-06: qty 2

## 2020-02-06 MED ORDER — EPHEDRINE 5 MG/ML INJ
INTRAVENOUS | Status: AC
  Filled 2020-02-06: qty 10

## 2020-02-06 MED ORDER — ALBUMIN HUMAN 5 % IV SOLN
INTRAVENOUS | Status: AC
  Filled 2020-02-06: qty 500

## 2020-02-06 MED ORDER — ONDANSETRON HCL 4 MG/2ML IJ SOLN
4.0000 mg | Freq: Four times a day (QID) | INTRAMUSCULAR | Status: DC | PRN
  Administered 2020-02-12: 12:00:00 4 mg via INTRAVENOUS
  Filled 2020-02-06: qty 2

## 2020-02-06 MED ORDER — CHLORHEXIDINE GLUCONATE CLOTH 2 % EX PADS
6.0000 | MEDICATED_PAD | Freq: Every day | CUTANEOUS | Status: DC
  Administered 2020-02-06 – 2020-02-13 (×6): 6 via TOPICAL

## 2020-02-06 MED ORDER — ONDANSETRON 4 MG PO TBDP
4.0000 mg | ORAL_TABLET | Freq: Four times a day (QID) | ORAL | Status: DC | PRN
  Filled 2020-02-06: qty 1

## 2020-02-06 MED ORDER — FENTANYL CITRATE (PF) 100 MCG/2ML IJ SOLN: 25.0000 ug | INTRAMUSCULAR | Status: DC | PRN

## 2020-02-06 MED ORDER — ONDANSETRON HCL 4 MG/2ML IJ SOLN
4.0000 mg | Freq: Once | INTRAMUSCULAR | Status: AC
  Administered 2020-02-06: 4 mg via INTRAVENOUS
  Filled 2020-02-06: qty 2

## 2020-02-06 MED ORDER — PROPOFOL 10 MG/ML IV BOLUS
INTRAVENOUS | Status: AC
  Filled 2020-02-06: qty 20

## 2020-02-06 MED ORDER — ONDANSETRON HCL 4 MG/2ML IJ SOLN
INTRAMUSCULAR | Status: AC
  Filled 2020-02-06: qty 2

## 2020-02-06 MED ORDER — MEPERIDINE HCL 50 MG/ML IJ SOLN: 6.2500 mg | INTRAMUSCULAR | Status: DC | PRN

## 2020-02-06 MED ORDER — METHYLENE BLUE 0.5 % INJ SOLN
INTRAVENOUS | Status: AC
  Filled 2020-02-06: qty 10

## 2020-02-06 MED ORDER — IOHEXOL 300 MG/ML  SOLN
100.0000 mL | Freq: Once | INTRAMUSCULAR | Status: AC | PRN
  Administered 2020-02-06: 100 mL via INTRAVENOUS

## 2020-02-06 MED ORDER — SODIUM CHLORIDE (PF) 0.9 % IJ SOLN
INTRAMUSCULAR | Status: AC
  Filled 2020-02-06: qty 10

## 2020-02-06 MED ORDER — OXYCODONE HCL 5 MG PO TABS: 5.0000 mg | ORAL_TABLET | Freq: Once | ORAL | Status: DC | PRN

## 2020-02-06 MED ORDER — ALBUMIN HUMAN 5 % IV SOLN: INTRAVENOUS | Status: DC | PRN

## 2020-02-06 MED ORDER — KETAMINE HCL 50 MG/ML IJ SOLN
INTRAMUSCULAR | Status: AC
  Filled 2020-02-06: qty 10

## 2020-02-06 MED ORDER — PIPERACILLIN-TAZOBACTAM 3.375 G IVPB
3.3750 g | Freq: Three times a day (TID) | INTRAVENOUS | Status: DC
  Administered 2020-02-06 – 2020-02-13 (×21): 3.375 g via INTRAVENOUS
  Filled 2020-02-06 (×24): qty 50

## 2020-02-06 MED ORDER — SUGAMMADEX SODIUM 200 MG/2ML IV SOLN
INTRAVENOUS | Status: DC | PRN
  Administered 2020-02-06: 200 mg via INTRAVENOUS

## 2020-02-06 MED ORDER — MORPHINE SULFATE (PF) 2 MG/ML IV SOLN
2.0000 mg | Freq: Once | INTRAVENOUS | Status: AC
  Administered 2020-02-06: 2 mg via INTRAVENOUS
  Filled 2020-02-06: qty 1

## 2020-02-06 MED ORDER — PANTOPRAZOLE SODIUM 40 MG IV SOLR
40.0000 mg | Freq: Once | INTRAVENOUS | Status: AC
  Administered 2020-02-06: 40 mg via INTRAVENOUS
  Filled 2020-02-06: qty 40

## 2020-02-06 MED ORDER — ACETAMINOPHEN 10 MG/ML IV SOLN
INTRAVENOUS | Status: DC | PRN
  Administered 2020-02-06: 1000 mg via INTRAVENOUS

## 2020-02-06 MED ORDER — PANTOPRAZOLE SODIUM 40 MG IV SOLR
40.0000 mg | Freq: Two times a day (BID) | INTRAVENOUS | Status: DC
  Administered 2020-02-06 – 2020-02-13 (×14): 40 mg via INTRAVENOUS
  Filled 2020-02-06 (×14): qty 40

## 2020-02-06 MED ORDER — EPHEDRINE SULFATE 50 MG/ML IJ SOLN
INTRAMUSCULAR | Status: DC | PRN
  Administered 2020-02-06 (×2): 10 mg via INTRAVENOUS
  Administered 2020-02-06: 15 mg via INTRAVENOUS

## 2020-02-06 MED ORDER — SODIUM CHLORIDE 0.9 % IV BOLUS
500.0000 mL | Freq: Once | INTRAVENOUS | Status: AC
  Administered 2020-02-06: 500 mL via INTRAVENOUS

## 2020-02-06 MED ORDER — ONDANSETRON HCL 4 MG/2ML IJ SOLN
INTRAMUSCULAR | Status: DC | PRN
  Administered 2020-02-06: 4 mg via INTRAVENOUS

## 2020-02-06 MED ORDER — SODIUM CHLORIDE 0.9 % IV BOLUS: 1000.0000 mL | Freq: Once | INTRAVENOUS | Status: DC

## 2020-02-06 MED ORDER — SODIUM CHLORIDE 0.9% IV SOLUTION
Freq: Once | INTRAVENOUS | Status: DC
  Filled 2020-02-06: qty 250

## 2020-02-06 MED ORDER — ROCURONIUM BROMIDE 100 MG/10ML IV SOLN
INTRAVENOUS | Status: DC | PRN
  Administered 2020-02-06 (×2): 30 mg via INTRAVENOUS
  Administered 2020-02-06 (×2): 20 mg via INTRAVENOUS
  Administered 2020-02-06: 50 mg via INTRAVENOUS

## 2020-02-06 MED ORDER — PANTOPRAZOLE SODIUM 40 MG IV SOLR: 40.0000 mg | Freq: Two times a day (BID) | INTRAVENOUS | Status: DC

## 2020-02-06 MED ORDER — HYDROMORPHONE HCL 1 MG/ML IJ SOLN
0.5000 mg | INTRAMUSCULAR | Status: DC | PRN
  Administered 2020-02-07: 14:00:00 0.5 mg via INTRAVENOUS
  Administered 2020-02-08 – 2020-02-11 (×3): 1 mg via INTRAVENOUS
  Filled 2020-02-06 (×4): qty 1

## 2020-02-06 MED ORDER — HEMOSTATIC AGENTS (NO CHARGE) OPTIME
TOPICAL | Status: DC | PRN
  Administered 2020-02-06: 1 via TOPICAL

## 2020-02-06 SURGICAL SUPPLY — 102 items
APPLIER CLIP 11 MED OPEN (CLIP)
APPLIER CLIP 13 LRG OPEN (CLIP)
BARRIER ADH SEPRAFILM 3INX5IN (MISCELLANEOUS) IMPLANT
BLADE CLIPPER SURG (BLADE) ×4 IMPLANT
BLADE SURG SZ10 CARB STEEL (BLADE) ×4 IMPLANT
BULB RESERV EVAC DRAIN JP 100C (MISCELLANEOUS) ×4 IMPLANT
CANISTER SUCT 1200ML W/VALVE (MISCELLANEOUS) ×4 IMPLANT
CANISTER SUCT 3000ML PPV (MISCELLANEOUS) ×4 IMPLANT
CANNULA REDUC XI 12-8 STAPL (CANNULA) ×3
CANNULA REDUC XI 12-8MM STAPL (CANNULA) ×1
CANNULA REDUCER 12-8 DVNC XI (CANNULA) ×2 IMPLANT
CATH ROBINSON RED A/P 16FR (CATHETERS) ×8 IMPLANT
CHLORAPREP W/TINT 26 (MISCELLANEOUS) ×8 IMPLANT
CLIP APPLIE 11 MED OPEN (CLIP) IMPLANT
CLIP APPLIE 13 LRG OPEN (CLIP) IMPLANT
CLIP VESOLOCK MED LG 6/CT (CLIP) ×4 IMPLANT
COVER BACK TABLE REUSABLE LG (DRAPES) ×4 IMPLANT
COVER TIP SHEARS 8 DVNC (MISCELLANEOUS) ×2 IMPLANT
COVER TIP SHEARS 8MM DA VINCI (MISCELLANEOUS) ×4
COVER WAND RF STERILE (DRAPES) ×8 IMPLANT
DEFOGGER SCOPE WARMER CLEARIFY (MISCELLANEOUS) ×4 IMPLANT
DERMABOND ADVANCED (GAUZE/BANDAGES/DRESSINGS) ×2
DERMABOND ADVANCED .7 DNX12 (GAUZE/BANDAGES/DRESSINGS) ×2 IMPLANT
DRAIN CHANNEL JP 19F (MISCELLANEOUS) ×4 IMPLANT
DRAPE ARM DVNC X/XI (DISPOSABLE) ×8 IMPLANT
DRAPE COLUMN DVNC XI (DISPOSABLE) ×2 IMPLANT
DRAPE DA VINCI XI ARM (DISPOSABLE) ×16
DRAPE DA VINCI XI COLUMN (DISPOSABLE) ×4
DRAPE LAPAROTOMY 100X77 ABD (DRAPES) ×4 IMPLANT
DRSG OPSITE POSTOP 3X4 (GAUZE/BANDAGES/DRESSINGS) ×8 IMPLANT
DRSG OPSITE POSTOP 4X10 (GAUZE/BANDAGES/DRESSINGS) IMPLANT
DRSG OPSITE POSTOP 4X12 (GAUZE/BANDAGES/DRESSINGS) ×4 IMPLANT
DRSG TEGADERM 4X4.75 (GAUZE/BANDAGES/DRESSINGS) ×8 IMPLANT
ELECT BLADE 6.5 EXT (BLADE) ×4 IMPLANT
ELECT CAUTERY BLADE 6.4 (BLADE) ×4 IMPLANT
ELECT REM PT RETURN 9FT ADLT (ELECTROSURGICAL) ×8
ELECTRODE REM PT RTRN 9FT ADLT (ELECTROSURGICAL) ×4 IMPLANT
GLOVE BIO SURGEON STRL SZ7 (GLOVE) ×20 IMPLANT
GOWN STRL REUS W/ TWL LRG LVL3 (GOWN DISPOSABLE) ×10 IMPLANT
GOWN STRL REUS W/TWL LRG LVL3 (GOWN DISPOSABLE) ×20
HANDLE SUCTION POOLE (INSTRUMENTS) IMPLANT
HANDLE YANKAUER SUCT BULB TIP (MISCELLANEOUS) ×8 IMPLANT
HEMOSTAT SURGICEL 2X14 (HEMOSTASIS) ×4 IMPLANT
IRRIGATION STRYKERFLOW (MISCELLANEOUS) IMPLANT
IRRIGATOR STRYKERFLOW (MISCELLANEOUS)
KIT PINK PAD W/HEAD ARE REST (MISCELLANEOUS) ×4
KIT PINK PAD W/HEAD ARM REST (MISCELLANEOUS) ×2 IMPLANT
LABEL OR SOLS (LABEL) ×4 IMPLANT
LIGASURE IMPACT 36 18CM CVD LR (INSTRUMENTS) ×4 IMPLANT
MANIFOLD NEPTUNE II (INSTRUMENTS) ×8 IMPLANT
NEEDLE HYPO 22GX1.5 SAFETY (NEEDLE) ×12 IMPLANT
OBTURATOR OPTICAL STANDARD 8MM (TROCAR) ×4
OBTURATOR OPTICAL STND 8 DVNC (TROCAR) ×2
OBTURATOR OPTICALSTD 8 DVNC (TROCAR) ×2 IMPLANT
PACK BASIN MAJOR ARMC (MISCELLANEOUS) ×4 IMPLANT
PACK LAP CHOLECYSTECTOMY (MISCELLANEOUS) ×4 IMPLANT
PENCIL ELECTRO HAND CTR (MISCELLANEOUS) ×4 IMPLANT
POUCH SPECIMEN RETRIEVAL 10MM (ENDOMECHANICALS) IMPLANT
RELOAD PROXIMATE 75MM BLUE (ENDOMECHANICALS) IMPLANT
RELOAD PROXIMATE TA60MM BLUE (ENDOMECHANICALS) ×8 IMPLANT
SEAL CANN UNIV 5-8 DVNC XI (MISCELLANEOUS) ×6 IMPLANT
SEAL XI 5MM-8MM UNIVERSAL (MISCELLANEOUS) ×12
SEALER VESSEL DA VINCI XI (MISCELLANEOUS)
SEALER VESSEL EXT DVNC XI (MISCELLANEOUS) IMPLANT
SET TUBE SMOKE EVAC HIGH FLOW (TUBING) ×4 IMPLANT
SOLUTION ELECTROLUBE (MISCELLANEOUS) ×4 IMPLANT
SPONGE KITTNER 5P (MISCELLANEOUS) ×8 IMPLANT
SPONGE LAP 18X18 RF (DISPOSABLE) ×36 IMPLANT
SPONGE LAP 18X36 RFD (DISPOSABLE) IMPLANT
SPONGE LAP 4X18 RFD (DISPOSABLE) ×4 IMPLANT
STAPLER CANNULA SEAL DVNC XI (STAPLE) ×2 IMPLANT
STAPLER CANNULA SEAL XI (STAPLE) ×4
STAPLER CIRCULAR MANUAL XL 25 (STAPLE) ×4 IMPLANT
STAPLER GUN LINEAR PROX 60 (STAPLE) ×4 IMPLANT
STAPLER PROXIMATE 75MM BLUE (STAPLE) ×4 IMPLANT
STAPLER SKIN PROX 35W (STAPLE) IMPLANT
SUCTION POOLE HANDLE (INSTRUMENTS)
SUT ETHILON 3-0 FS-10 30 BLK (SUTURE) ×4
SUT MNCRL 4-0 (SUTURE)
SUT MNCRL 4-0 27XMFL (SUTURE)
SUT PDS AB 0 CT1 27 (SUTURE) ×8 IMPLANT
SUT PROLENE 0 CT 2 (SUTURE) ×4 IMPLANT
SUT SILK 2 0 (SUTURE)
SUT SILK 2 0 SH (SUTURE) ×12 IMPLANT
SUT SILK 2 0 SH CR/8 (SUTURE) ×8 IMPLANT
SUT SILK 2 0SH CR/8 30 (SUTURE) IMPLANT
SUT SILK 2-0 18XBRD TIE 12 (SUTURE) IMPLANT
SUT VIC AB 0 CT1 36 (SUTURE) IMPLANT
SUT VIC AB 2-0 SH 27 (SUTURE)
SUT VIC AB 2-0 SH 27XBRD (SUTURE) IMPLANT
SUT VIC AB 3-0 SH 27 (SUTURE)
SUT VIC AB 3-0 SH 27X BRD (SUTURE) IMPLANT
SUT VICRYL 0 AB UR-6 (SUTURE) IMPLANT
SUTURE EHLN 3-0 FS-10 30 BLK (SUTURE) ×2 IMPLANT
SUTURE MNCRL 4-0 27XMF (SUTURE) IMPLANT
SYR 30ML LL (SYRINGE) ×12 IMPLANT
SYR 3ML LL SCALE MARK (SYRINGE) ×4 IMPLANT
SYR TOOMEY IRRIG 70ML (MISCELLANEOUS) ×4
SYRINGE TOOMEY IRRIG 70ML (MISCELLANEOUS) ×2 IMPLANT
TAPE TRANSPORE STRL 2 31045 (GAUZE/BANDAGES/DRESSINGS) IMPLANT
TRAY FOLEY MTR SLVR 16FR STAT (SET/KITS/TRAYS/PACK) ×4 IMPLANT
TROCAR BALLN GELPORT 12X130M (ENDOMECHANICALS) IMPLANT

## 2020-02-06 NOTE — Op Note (Addendum)
PROCEDURES: 1. Complex foley placement 2. Robotic diagnostic laparoscopy 3. Laparotomy with repair of duodenal ulcer with graham patch 4. Pyloric/duodenal  exclusion with gastrojejunostomy and omega loop 5. Feeding Jejunostomy 6. Open cholecystectomy 7. 19 blake drain placement  Pre-operative Diagnosis:perforated viscus  Post-operative Diagnosis: same  Surgeon: [place]   Assistants: Otho Ket [facility], required for exposure, multiple anastomosis and complexity of the case  Anesthesia: General endotracheal anesthesia  ASA Class: 4  Surgeon: Caroleen Hamman , MD FACS  Anesthesia: Gen. with endotracheal tube  Findings: Large perforated duodenal ulcer on second portion of the duodenum, posterior and lateral wall encompassing about 50% of the circumference of the lumen  Estimated Blood Loss: 150cc              Complications: none immediate                Procedure Details  The patient was seen again in the Holding Room. The benefits, complications, treatment options, and expected outcomes were discussed with the patient. The risks of bleeding, infection, recurrence of symptoms, failure to resolve symptoms,  bowel injury, any of which could require further surgery were reviewed with the patient.   The patient was taken to Operating Room, identified and the procedure verified.  A Time Out was held and the above information confirmed.  Prior to the induction of general anesthesia, antibiotic prophylaxis was administered. VTE prophylaxis was in place. General endotracheal anesthesia was then administered and tolerated well. After the induction, the abdomen was prepped with Chloraprep and draped in the sterile fashion. The patient was positioned in the supine position.  We encountered a significant phimosis, using betadine I prepped the penis and used a hemostat to dilate the foreskin until the urethra was visualized and a foley catheter was placed under sterile conditions. Balloon  inflated.  Attention was placed to the abdominal cavity, infraumbilical incision created and fascia elevated and incised. We encounter mesh. Peritoneum divided and abdominal cavit entered. Pneumoperitoneum was created w/o HD changes. Three additional ports were placed under direct vision. We saw significant peritonitis and inflammatory response around the GB. I was not able to easily have exposure and therefore decided to convert to a midline laparotomy. All laparoscopic ports were removed an generous midline laparotomy performed. We visualized severe inflammatory response on the RUQ quadrant including the duodenum, antrum and Gallbladder.  We saw significant bile stain ascites and I could not tell if this was coming from a small perforation in the Gb or the duodenum.  I was able to perform a kocher maneuver and identified the duodenal perforation that was on the lateral and posterior wall of the duodenum, involving 50% of its circumference and with no good tissue to close primarily. A piece of greater omentum was mobilized as a pedicle and graham path was used to close the defect with greater omentum. Because of  The tenuous repair I felt that the appropriate operative intervention was a pyloric exclusion with a gastrojejunostomy and feeding tube. Greater omentum was divided from the greater curvature and we were able to have good mobilization of the stomach. Anterior gastrotomy was performed with cautery and from the inside the pylorus was closed with a pursestring using 0 prolene suture. Attention was turned to the ligament of treitz and we measure 60 cms and selected the spot to perform a jejunojunostomy using the 75 mm stapler device. Via the common channel the head of the 25 mm EEA stapler was introduced and using the shaft of  the stapler an anastomosis was created from the posterior wall of the body of the stomach to the jejunum. Two donuts were visualized. Anterior gastrotomy was closed with TA 60 mm  stapler and the common channel for the enterotomy was closed in a similar fashion. All the anastomosis were widely patent and under no tension.  Methylene blue was injected via NGT and no evidence of leaks were observed. 40 cms from the gastrojejunostomy a Witzel jejunostomy was done using a 16 FR in the standard fashion.  The jejunum was tacked to the post abdominal wall under direct visualization. Abdominal cavity was irrigated with 2 lts of NS and 19 blake drain was placed on the RUQ next to the graham patch. Given the perforation of the gallbladder I felt that cholecystectomy needed to be performed. The tissues were very friable but the cystic artery and duct were identified and ligated w 2-0 silks. GB was removed from liver bed using electrocautery.   We changed gloves and close the abdomen with a 0 PDS suture in a running fashion and the skin was closed with staples. Liposomal Marcaine was injected on all incision sites under direct visualization. Needle and laparotomy count were correct and there were no immediate complications.  Caroleen Hamman, MD, FACS

## 2020-02-06 NOTE — ED Provider Notes (Signed)
[facility] Emergency Department Provider Note  ____________________________________________   First MD Initiated Contact with Patient 02/01/2020 0914     (approximate)  I have reviewed the triage vital signs and the nursing notes.   HISTORY  Chief Complaint Abdominal Pain    HPI Thomas [name] is a 85 y.o. male here with abdominal pain.  History is limited as patient uses American sign language and refuses to converse with me due to pain.  He had written on a pad that the pain began today as severe, bilateral abdominal pain.  He has had nausea but no vomiting.  No diarrhea.  Patient refuses to provide additional details.  Per review of records, the patient was just hospitalized  from 11/12-11/17 for respiratory failure due to sepsis, pneumonia, and possible CHF.       Past Medical History:  Diagnosis Date  . AICD (automatic cardioverter/defibrillator) present   . Bacteremia due to group B Streptococcus    Recurrent admissions for group B Strepotococcus bacteremia of unknown source with TEEs negative for vegetation 06/2019 and 08/2019  . Biventricular ICD (implantable cardioverter-defibrillator) in place 03/24/2005   Implantation of a Medtronic Adapta ADDRO1, serial number [id number]  . CHF (congestive heart failure) ([place])   . CKD (chronic kidney disease), stage III ([place])   . Coronary artery disease    a. s/p CABG 1986. b. Multiple PCIs/caths. c. 09/2013: s/p PTCA and BMS to SVG-OM.  [name]    Requires sign language interpreter  . Dysrhythmia   . History of abdominal aortic aneurysm   . History of bleeding peptic ulcer 1980  . History of epididymitis 2013  . HTN (hypertension)   . Hydronephrosis with ureteropelvic junction obstruction   . Hydroureter on left 2009  . Hypertension   . Ischemic cardiomyopathy    a. Prior EF 30-35%, s/p BIV-ICD. b. 09/2013: EF 45-50%.  . Moderate tricuspid regurgitation   . PAF (paroxysmal atrial fibrillation) (HCC)     Not on [place] 2/2 GIB  . Presence of permanent cardiac pacemaker 2002   Original placed in 2002 for CHB then 2007 and 2014 device change out  . Prostate cancer ([place])   . Status post coronary artery bypass grafting 1986   LIMA to the LAD, SVG to OM, SVG to RCA  . Testicular swelling     Patient Active Problem List   Diagnosis Date Noted  . Respiratory failure with hypoxia ([place]) 01/26/2020  . Chronic diastolic CHF (congestive heart failure) ([place]) 01/15/2020  . Duodenal ulceration   . 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 [place] intertrigo 08/24/2019  . Edema of left upper extremity 08/24/2019  . Pneumonia of left lower lobe due to group B Streptococcus ([place]) 08/24/2019  . Heart block AV complete ([place]) 08/23/2019  . Sepsis due to group B Streptococcus ([employer]) 08/20/2019  . Acute blood loss anemia 08/20/2019  . HCAP (healthcare-associated pneumonia) 08/18/2019  . Severe sepsis with septic shock ([place]) 08/18/2019  . Acute renal failure superimposed on stage 3a chronic kidney disease ([place]) 08/18/2019  . Black stool 08/18/2019  . Iron deficiency anemia 08/18/2019  . Group B streptococcal bacteriuria 06/18/2019  . Lactic acidosis 06/18/2019  . Pressure ulcer, stage 2 ([place]) 06/18/2019  . Bacteremia due to group B Streptococcus 06/17/2019  . SIRS (systemic inflammatory response syndrome) ([place]) 06/16/2019  . Leg swelling   . Decompensated hepatic cirrhosis ([place]) 05/22/2019  . Anasarca 05/19/2019  . CRI (chronic renal insufficiency), stage 3 (moderate) ([place]) 01/30/2019  .  Iron deficiency anemia due to chronic blood loss 11/02/2018  . Coronary artery disease of bypass graft of native heart with stable angina pectoris ([place]) 11/02/2018  . Pure hypercholesterolemia 11/02/2018  . Biventricular automatic implantable cardioverter defibrillator in situ 11/02/2018  . GIB (gastrointestinal bleeding) 09/14/2018  . Hypotension 08/14/2018  . Chronic systolic CHF (congestive heart failure)  ([place]) 08/12/2018  . Acute on chronic combined systolic and diastolic CHF (congestive heart failure) ([place])   . GI bleed 05/26/2018  . Occult GI bleeding 05/25/2018  . Normocytic anemia 05/25/2018  . Elevated troponin 05/25/2018  . Lymphedema 02/28/2018  . Demand ischemia ([place]) 07/15/2016  . Weakness 07/15/2016  . Fatigue 07/15/2016  . Stroke (cerebrum) ([place]) 10/31/2015  . Bulbous urethral stricture 09/18/2015  . Bilateral deafness 08/19/2015  . Bilateral cataracts 08/19/2015  . Acid reflux 08/19/2015  . Mixed hyperlipidemia 08/19/2015  . Essential hypertension 08/19/2015  . Myocardial infarction ([place]) 08/19/2015  . Calculus of kidney 08/19/2015  . Pacemaker 08/19/2015  . Gastroduodenal ulcer 08/19/2015  . Dupuytren's contracture of foot 08/19/2015  . Malignant neoplasm of prostate ([place]) 08/19/2015  . Microhematuria 08/19/2015  . CAD S/P multiple PCIs 02/22/2015  . Status post coronary artery bypass grafting 02/22/2015  . Benign essential HTN 04/02/2014  . Hematochezia 02/20/2014  . Tricuspid valve regurgitation, nonrheumatic 02/20/2014  . Mobitz type II atrioventricular block 12/12/2013  . Long term current use of anticoagulant 10/18/2013  . Cardiomyopathy, ischemic 11/11/2012  . Permanent atrial fibrillation ([place]) 11/11/2012  . Biventricular ICD in place 11/11/2012  . Chronic combined systolic and diastolic heart failure ([place]) 11/11/2012    Past Surgical History:  Procedure Laterality Date  . 2-D echocardiogram  11/20/2011   Ejection fraction 30-35% moderate concentric left ventricular hypertrophy. Left atrium is moderately dilated. Mild MR. Mild or  . BI-VENTRICULAR IMPLANTABLE CARDIOVERTER DEFIBRILLATOR N/A 12/16/2012   Procedure: BI-VENTRICULAR IMPLANTABLE CARDIOVERTER DEFIBRILLATOR  (CRT-D);  Surgeon: [name], MD;  Location: [facility] CATH LAB;  Service: Cardiovascular;  Laterality: N/A;  . BIV ICD INSERTION CRT-D N/A 09/01/2019   Procedure: BIV ICD INSERTION CRT-D;   Surgeon: [name], MD;  Location: [place] CV LAB;  Service: Cardiovascular;  Laterality: N/A;  . CARDIAC CATHETERIZATION  12/10/2011   SVG to OM widely patent.  LIMA to LAD patent  . CATARACT EXTRACTION W/PHACO Right 10/12/2017   Procedure: CATARACT EXTRACTION PHACO AND INTRAOCULAR LENS PLACEMENT (IOC);  Surgeon: Birder Robson, MD;  Location: [facility] ORS;  Service: Ophthalmology;  Laterality: Right;  [place] 00:57 AP% 15.9 CDE 9.07 Fluid pack lot # [id number]  . COLONOSCOPY N/A 07/13/2018   Procedure: COLONOSCOPY;  Surgeon: Toledo, Benay Pike, MD;  Location: [facility] ENDOSCOPY;  Service: Gastroenterology;  Laterality: N/A;  . CORONARY ARTERY BYPASS GRAFT  1986  . EMBOLIZATION N/A 01/18/2020   Procedure: EMBOLIZATION;  Surgeon: Algernon Huxley, MD;  Location: [place] CV LAB;  Service: Cardiovascular;  Laterality: N/A;  . ENTEROSCOPY N/A 09/14/2018   Procedure: ENTEROSCOPY;  Surgeon: Toledo, Benay Pike, MD;  Location: [facility] ENDOSCOPY;  Service: Gastroenterology;  Laterality: N/A;  symptomatic anemia, GI blood loss anemia, melena, positive small bowel capsule endoscopy showing source of bleeding   . ENTEROSCOPY N/A 08/22/2019   Procedure: ENTEROSCOPY;  Surgeon: Lin Landsman, MD;  Location: [facility] ENDOSCOPY;  Service: Gastroenterology;  Laterality: N/A;  . ESOPHAGOGASTRODUODENOSCOPY N/A 07/13/2018   Procedure: ESOPHAGOGASTRODUODENOSCOPY (EGD);  Surgeon: Toledo, Benay Pike, MD;  Location: [facility] ENDOSCOPY;  Service: Gastroenterology;  Laterality: N/A;  . ESOPHAGOGASTRODUODENOSCOPY N/A 09/14/2018  Procedure: ESOPHAGOGASTRODUODENOSCOPY (EGD);  Surgeon: Toledo, Benay Pike, MD;  Location: [facility] ENDOSCOPY;  Service: Gastroenterology;  Laterality: N/A;  SIGN LANAGUAGE INTERPRETER  . ESOPHAGOGASTRODUODENOSCOPY N/A 06/21/2019   Procedure: ESOPHAGOGASTRODUODENOSCOPY (EGD);  Surgeon: Toledo, Benay Pike, MD;  Location: [facility] ENDOSCOPY;  Service: Gastroenterology;  Laterality: N/A;  . ESOPHAGOGASTRODUODENOSCOPY N/A  01/15/2020   Procedure: ESOPHAGOGASTRODUODENOSCOPY (EGD);  Surgeon: Lucilla Lame, MD;  Location[facility] ENDOSCOPY;  Service: Endoscopy;  Laterality: N/A;  . ESOPHAGOGASTRODUODENOSCOPY (EGD) WITH PROPOFOL N/A 05/27/2018   Procedure: ESOPHAGOGASTRODUODENOSCOPY (EGD) WITH PROPOFOL;  Surgeon: Clarene Essex, MD;  Location: [place];  Service: Endoscopy;  Laterality: N/A;  . ICD LEAD REMOVAL Left 08/25/2019   Procedure: ICD LEAD EXTRACTION;  Surgeon: [name], MD;  Location: [place];  Service: Cardiovascular;  Laterality: Left;  DR. Roxan Hockey BACK UP  . INSERT / REPLACE / REMOVE PACEMAKER    . LEFT HEART CATHETERIZATION WITH CORONARY/GRAFT ANGIOGRAM N/A 12/10/2011   Procedure: LEFT HEART CATHETERIZATION WITH Beatrix Fetters;  Surgeon: Sanda Klein, MD;  Location: [facility] CATH LAB;  Service: Cardiovascular;  Laterality: N/A;  . LEFT HEART CATHETERIZATION WITH CORONARY/GRAFT ANGIOGRAM N/A 09/25/2013   Procedure: LEFT HEART CATHETERIZATION WITH Beatrix Fetters;  Surgeon: Blane Ohara, MD;  Location: [facility] CATH LAB;  Service: Cardiovascular;  Laterality: N/A;  . MULTIPLE EXTRACTIONS WITH ALVEOLOPLASTY Bilateral 08/30/2019   Procedure: Extractionof tooth #'s 2, 28, and 31 with alveoloplasty and gross debridement of remaining teeth.;  Surgeon: [name], DDS;  Location: [place];  Service: Oral Surgery;  Laterality: Bilateral;  . Persantine Myoview  05/06/2010   Post-rest ejection fraction 30%. No significant ischemia demonstrated. Compared to previous study there is no significant change.  . TEE WITHOUT CARDIOVERSION N/A 06/22/2019   Procedure: TRANSESOPHAGEAL ECHOCARDIOGRAM (TEE);  Surgeon: Minna Merritts, MD;  Location: [facility] ORS;  Service: Cardiovascular;  Laterality: N/A;  . TEE WITHOUT CARDIOVERSION N/A 08/23/2019   Procedure: TRANSESOPHAGEAL ECHOCARDIOGRAM (TEE);  Surgeon: Kate Sable, MD;  Location: [facility] ORS;  Service: Cardiovascular;  Laterality: N/A;  . TEE WITHOUT  CARDIOVERSION N/A 08/25/2019   Procedure: TRANSESOPHAGEAL ECHOCARDIOGRAM (TEE);  Surgeon: [name], MD;  Location: [facility] OR;  Service: Cardiovascular;  Laterality: N/A;  . TRANSURETHRAL RESECTION OF PROSTATE     s/p    Prior to Admission medications   Medication Sig Start Date End Date Taking? Authorizing Provider  acetaminophen (TYLENOL) 325 MG tablet Take 2 tablets (650 mg total) by mouth every 6 (six) hours as needed for mild pain or fever. 08/23/19   Loletha Grayer, MD  albuterol (PROVENTIL) (2.5 MG/3ML) 0.083% nebulizer solution Inhale 3 mLs into the lungs every 4 (four) hours as needed for wheezing or shortness of breath. 08/23/19   Loletha Grayer, MD  ferrous sulfate 325 (65 FE) MG tablet Take 1 tablet (325 mg total) by mouth 2 (two) times daily with a meal. Patient taking differently: Take 325 mg by mouth daily with breakfast.  09/11/19 01/26/20  Mercy Riding, MD  losartan (COZAAR) 25 MG tablet Take 25 mg by mouth daily.    [provider]  midodrine (PROAMATINE) 5 MG tablet TAKE 1 TABLET (5 MG TOTAL) BY MOUTH 3 (THREE) TIMES DAILY WITH MEALS. 01/23/20   [name], MD  Multiple Vitamin (MULTIVITAMIN WITH MINERALS) TABS tablet Take 1 tablet by mouth daily. 05/29/19   Ezekiel Slocumb, DO  Nystatin (GERHARDT'S BUTT CREAM) CREA Apply 1 application topically 2 (two) times daily. Patient taking differently: Apply 1 application topically as needed.  09/11/19  Mercy Riding, MD  ondansetron (ZOFRAN) 4 MG tablet Take 1 tablet (4 mg total) by mouth every 8 (eight) hours as needed for nausea or vomiting. Patient not taking: Reported on 01/26/2020 09/12/19 09/11/20  Mercy Riding, MD  pantoprazole (PROTONIX) 40 MG tablet Take 1 tablet (40 mg total) by mouth 2 (two) times daily. Patient not taking: Reported on 01/26/2020 01/21/20 02/20/20  Wyvonnia Dusky, MD  simvastatin (ZOCOR) 20 MG tablet TAKE 1 TABLET BY MOUTH EVERY DAY Patient taking differently: Take 20 mg by mouth daily at  6 PM.  04/07/19   Kilroy, Doreene Burke, PA-C  spironolactone (ALDACTONE) 25 MG tablet Take 1 tablet (25 mg total) by mouth daily. 08/01/19 01/26/20  Alisa Graff, FNP  torsemide (DEMADEX) 20 MG tablet Take 2 tablets (40 mg total) by mouth 2 (two) times daily. 09/11/19   Mercy Riding, MD    Allergies Entresto [sacubitril-valsartan], Phenazopyridine, and Ramipril  Family History  Problem Relation Age of Onset  . Hypertension Father     Social History Social History   Tobacco Use  . Smoking status: Former Smoker    Quit date: 03/15/1985    Years since quitting: 34.9  . Smokeless tobacco: Never Used  Vaping Use  . Vaping Use: Never used  Substance Use Topics  . Alcohol use: No    Comment: occas.  . Drug use: No    Review of Systems  Review of Systems  Unable to perform ROS: Patient nonverbal  Gastrointestinal: Positive for abdominal pain and nausea.     ____________________________________________  PHYSICAL EXAM:      VITAL SIGNS: ED Triage Vitals  Enc Vitals Group     BP 01/26/2020 0914 105/66     Pulse Rate 01/25/2020 0913 74     Resp 02/09/2020 0913 18     Temp 01/17/2020 0914 98.2 F (36.8 C)     Temp Source 02/09/2020 0914 Oral     SpO2 02/11/2020 0913 99 %     Weight 01/28/2020 0914 174 lb 2.6 oz (79 kg)     Height 01/30/2020 0914 6\' 2"  (1.88 m)     Head Circumference --      Peak Flow --      Pain Score 01/28/2020 0913 8     Pain Loc --      Pain Edu? --      Excl. in [place]? --      Physical Exam Vitals and nursing note reviewed.  Constitutional:      General: He is not in acute distress.    Appearance: He is well-developed.     Comments: Appears uncomfortable, moaning in pain  HENT:     Head: Normocephalic and atraumatic.     Mouth/Throat:     Comments: Dry MM Eyes:     Conjunctiva/sclera: Conjunctivae normal.  Cardiovascular:     Rate and Rhythm: Normal rate and regular rhythm.     Heart sounds: Normal heart sounds. No murmur heard.  No friction rub.  Pulmonary:      Effort: Pulmonary effort is normal. No respiratory distress.     Breath sounds: Normal breath sounds. No wheezing or rales.  Abdominal:     General: There is no distension.     Palpations: Abdomen is soft.     Tenderness: There is generalized abdominal tenderness and tenderness in the right upper quadrant, epigastric area, periumbilical area and left upper quadrant. There is guarding and rebound.  Musculoskeletal:  Cervical back: Neck supple.  Skin:    General: Skin is warm.     Capillary Refill: Capillary refill takes less than 2 seconds.  Neurological:     Mental Status: He is alert and oriented to person, place, and time.     Motor: No abnormal muscle tone.       ____________________________________________   LABS (all labs ordered are listed, but only abnormal results are displayed)  Labs Reviewed  CBC WITH DIFFERENTIAL/PLATELET - Abnormal; Notable for the following components:      Result Value   RBC 3.77 (*)    Hemoglobin 10.3 (*)    HCT 33.4 (*)    RDW 19.1 (*)    Lymphs Abs 0.5 (*)    All other components within normal limits  COMPREHENSIVE METABOLIC PANEL - Abnormal; Notable for the following components:   Glucose, Bld 109 (*)    Albumin 2.8 (*)    Alkaline Phosphatase 138 (*)    Total Bilirubin 2.2 (*)    All other components within normal limits  URINALYSIS, COMPLETE (UACMP) WITH MICROSCOPIC - Abnormal; Notable for the following components:   Color, Urine AMBER (*)    APPearance HAZY (*)    Specific Gravity, Urine 1.039 (*)    Protein, ur 30 (*)    All other components within normal limits  RESP PANEL BY RT-PCR (FLU A&B, COVID) ARPGX2  LIPASE, BLOOD  LACTIC ACID, PLASMA  PROTIME-INR  APTT    ____________________________________________  EKG: Ventricular paced rhythm, ventricular rate 73.  QRS 129, QTc 479.  History of depression.  There QT evidence of acute ischemia or infarct. ________________________________________  RADIOLOGY All imaging,  including plain films, CT scans, and ultrasounds, independently reviewed by me, and interpretations confirmed via formal radiology reads.  ED MD interpretation:   CT A/P: Perforated ulcer with free air, b/l pleural effusions  Official radiology report(s): CT ABDOMEN PELVIS W CONTRAST  Result Date: 02/13/2020 CLINICAL DATA:  RIGHT lower quadrant abdominal pain with nausea; past history of coronary artery disease post bypass, pacemaker/ICD, CHF, ischemic cardiomyopathy, atrial fibrillation, prostate cancer, hypertension EXAM: CT ABDOMEN AND PELVIS WITH CONTRAST TECHNIQUE: Multidetector CT imaging of the abdomen and pelvis was performed using the standard protocol following bolus administration of intravenous contrast. Sagittal and coronal MPR images reconstructed from axial data set. CONTRAST:  17mL OMNIPAQUE IOHEXOL 300 MG/ML SOLN IV. No oral contrast. COMPARISON:  CT angio abdomen and pelvis 01/15/2020 FINDINGS: Lower chest: Moderate-sized BILATERAL pleural effusions and bibasilar atelectasis. Significant enlargement of cardiac chambers with postsurgical changes and pacemaker. Hepatobiliary: Nodular hepatic margins consistent with cirrhosis. Low-attenuation lesion lateral segment LEFT lobe 2.7 x 2.5 cm image 15. Gallbladder and liver otherwise unremarkable. Pancreas: Atrophic Spleen: Calcified granulomata.  Otherwise normal. Adrenals/Urinary Tract: Adrenal glands normal. BILATERAL renal cortical atrophy. Cortical scarring and small nonobstructing calculus RIGHT kidney. Tiny LEFT renal cyst. No hydronephrosis or hydroureter. Bladder unremarkable. Stomach/Bowel: Mild sigmoid diverticulosis. Appendix not visualized. Remainder of colon unremarkable. Stomach decompressed, unable to accurately assess wall thickness. Thickening of duodenal bulb, which appears enlarged, with adjacent fluid and small amounts of extraluminal gas. Additionally, free intraperitoneal air. Findings consistent with perforated duodenal  ulcer. Embolization coils present medial to duodenal bulb. Vascular/Lymphatic: Extensive atherosclerotic calcifications of coronary arteries per, aorta, visceral arteries, iliac arteries, femoral arteries. Prior endo stenting of abdominal aorta into the common iliac arteries. Significant calcified plaque at the origins of the celiac artery, SMA, and renal arteries. Multiple small collaterals in LEFT pelvis. Reproductive: Minimal prostatic enlargement. Two  metallic clips in prostate gland. Other: Small amount of free fluid in upper abdomen and pelvis. Free air as noted above, predominately RIGHT upper quadrant. No hernia. Musculoskeletal: Osseous demineralization. Degenerative disc and facet disease changes lumbar spine. IMPRESSION: Perforated duodenal ulcer with free intraperitoneal air and fluid. Cirrhotic liver. Moderate-sized BILATERAL pleural effusions and bibasilar atelectasis. Mild sigmoid diverticulosis. BILATERAL renal cortical atrophy with cortical scarring and small nonobstructing calculus RIGHT kidney. Extensive atherosclerotic disease including visceral artery. Enlargement of cardiac chambers post CABG and ICD. Aortic Atherosclerosis (ICD10-I70.0). Critical Value/emergent results were called by telephone at the time of interpretation on 01/28/2020 at 11:25 am to provider DR. Duffy Bruce , who verbally acknowledged these results. Electronically Signed   By: Lavonia Dana M.D.   On: 01/30/2020 11:26    ____________________________________________  PROCEDURES   Procedure(s) performed (including Critical Care):  .Critical Care Performed by: Duffy Bruce, MD Authorized by: Duffy Bruce, MD   Critical care provider statement:    Critical care time (minutes):  35   Critical care time was exclusive of:  Separately billable procedures and treating other patients and teaching time   Critical care was necessary to treat or prevent imminent or life-threatening deterioration of the following  conditions:  Cardiac failure, circulatory failure, respiratory failure and sepsis   Critical care was time spent personally by me on the following activities:  Development of treatment plan with patient or surrogate, discussions with consultants, evaluation of patient's response to treatment, examination of patient, obtaining history from patient or surrogate, ordering and performing treatments and interventions, ordering and review of laboratory studies, ordering and review of radiographic studies, pulse oximetry, re-evaluation of patient's condition and review of old charts   I assumed direction of critical care for this patient from another provider in my specialty: no      ____________________________________________  INITIAL IMPRESSION / MDM / [place] / ED COURSE  As part of my medical decision making, I reviewed the following data within the [place] notes reviewed and incorporated, Old chart reviewed, Notes from prior ED visits, and [place] Controlled Substance Database       *Thomas [name] was evaluated in Emergency Department on 02/11/2020 for the symptoms described in the history of present illness. He was evaluated in the context of the global COVID-19 pandemic, which necessitated consideration that the patient might be at risk for infection with the SARS-CoV-2 virus that causes COVID-19. Institutional protocols and algorithms that pertain to the evaluation of patients at risk for COVID-19 are in a state of rapid change based on information released by regulatory bodies including the CDC and federal and state organizations. These policies and algorithms were followed during the patient's care in the ED.  Some ED evaluations and interventions may be delayed as a result of limited staffing during the pandemic.*     Medical Decision Making: 85 year old male here with severe epigastric abdominal pain, nausea, and vomiting.  Patient is afebrile and  hemodynamically stable on arrival.  White count 7.0.  CMP is largely unremarkable with exception of elevated bilirubin of 2.2.  Given his extreme tenderness on exam and apparent distress, stat CT with contrast obtained.  CT reviewed, shows perforated duodenal ulcer with free fluid and air.  Patient will be started on IV Zosyn, Protonix, and general surgery consulted.  Dr. Dahlia Byes and PA Thedore Mins have evaluated the patient.  Patient will be admitted.  I updated the patient as well as his wife using ASL interpreter via  iPad.  Patient was maintained on telemetry in the ED with apparent normal sinus rhythm and no evidence of significant ectopy.  INR/PTT added on.  ____________________________________________  FINAL CLINICAL IMPRESSION(S) / ED DIAGNOSES  Final diagnoses:  Perforated ulcer ([place])     MEDICATIONS GIVEN DURING THIS VISIT:  Medications  piperacillin-tazobactam (ZOSYN) IVPB 3.375 g (has no administration in time range)  0.9 %  sodium chloride infusion (has no administration in time range)  fentaNYL (SUBLIMAZE) injection 50 mcg (50 mcg Intravenous Given 02/05/2020 0941)  ondansetron (ZOFRAN) injection 4 mg (4 mg Intravenous Given 01/21/2020 0941)  sodium chloride 0.9 % bolus 500 mL (0 mLs Intravenous Stopped 01/23/2020 1020)  iohexol (OMNIPAQUE) 300 MG/ML solution 100 mL (100 mLs Intravenous Contrast Given 01/21/2020 1040)  acetaminophen (TYLENOL) tablet 1,000 mg (1,000 mg Oral Given 01/28/2020 1114)  morphine 2 MG/ML injection 2 mg (2 mg Intravenous Given 01/29/2020 1115)     ED Discharge Orders    None       Note:  This document was prepared using Dragon voice recognition software and may include unintentional dictation errors.   Duffy Bruce, MD 01/18/2020 1200

## 2020-02-06 NOTE — ED Notes (Signed)
In person interpreter requested by patient. Request placed and pt updated.

## 2020-02-06 NOTE — Anesthesia Procedure Notes (Signed)
Procedure Name: Intubation Date/Time: 01/24/2020 2:03 PM Performed by: Lia Foyer, CRNA Pre-anesthesia Checklist: Patient identified, Emergency Drugs available, Suction available and Patient being monitored Patient Re-evaluated:Patient Re-evaluated prior to induction Oxygen Delivery Method: Circle system utilized Preoxygenation: Pre-oxygenation with 100% oxygen Induction Type: IV induction Ventilation: Mask ventilation without difficulty Laryngoscope Size: McGraph and 4 Grade View: Grade I Tube type: Oral Tube size: 7.5 mm Number of attempts: 1 Airway Equipment and Method: Stylet,  Oral airway and Video-laryngoscopy Placement Confirmation: ETT inserted through vocal cords under direct vision,  positive ETCO2 and breath sounds checked- equal and bilateral Secured at: 22 cm Tube secured with: Tape Dental Injury: Teeth and Oropharynx as per pre-operative assessment

## 2020-02-06 NOTE — ED Notes (Signed)
Wife- [name] [phone]. Home for lunch

## 2020-02-06 NOTE — Anesthesia Postprocedure Evaluation (Signed)
Anesthesia Post Note  Patient: NAZIRE FRUTH  Procedure(s) Performed: XI ROBOT ASSISTED DIAGNOSTIC LAPAROSCOPY (N/A ) EXPLORATORY LAPAROTOMY (N/A ) CHOLECYSTECTOMY  Patient location during evaluation: PACU Anesthesia Type: General Level of consciousness: awake and alert Pain management: pain level controlled Vital Signs Assessment: post-procedure vital signs reviewed and stable Respiratory status: spontaneous breathing, nonlabored ventilation, respiratory function stable and patient connected to nasal cannula oxygen Cardiovascular status: blood pressure returned to baseline and stable Postop Assessment: no apparent nausea or vomiting Anesthetic complications: no   No complications documented.   Last Vitals:  Vitals:   02/11/2020 1958 02/07/2020 2013  BP: 102/61 (!) 106/59  Pulse: 70 70  Resp: 20 20  Temp:  37.2 C  SpO2: 96% 97%    Last Pain:  Vitals:   01/16/2020 2013  TempSrc:   PainSc: [place]

## 2020-02-06 NOTE — Anesthesia Preprocedure Evaluation (Addendum)
Anesthesia Evaluation  Patient identified by MRN, date of birth, ID band Patient awake    Reviewed: Allergy & Precautions, NPO status , Patient's Chart, lab work & pertinent test results  History of Anesthesia Complications Negative for: history of anesthetic complications  Airway Mallampati: II  TM Distance: >3 FB Neck ROM: Full    Dental  (+) Poor Dentition   Pulmonary neg sleep apnea, neg COPD, former smoker,    breath sounds clear to auscultation- rhonchi (-) wheezing      Cardiovascular hypertension, + CAD, + Past MI, + CABG and +CHF (EF 40-45%)  + pacemaker + Cardiac Defibrillator  Rhythm:Regular Rate:Normal - Systolic murmurs and - Diastolic murmurs Echo 50/35/46: 1. Left ventricular ejection fraction, by estimation, is 40 to 45%. The  left ventricle has mildly decreased function. The left ventricle  demonstrates regional wall motion abnormalities (Challenging images,  though there is concern for inferior/posterior  hypokinesis). The left ventricular internal cavity size was moderately  dilated. Left ventricular diastolic parameters are indeterminate.  2. Right ventricular systolic function is normal. The right ventricular  size is moderately enlarged. There is moderately elevated pulmonary artery  systolic pressure. The estimated right ventricular systolic pressure is  56.8 mmHg.  3. Left atrial size was moderately dilated.  4. The mitral valve is normal in structure. Moderate mitral valve  regurgitation.  5. Tricuspid valve regurgitation is moderate to severe.  6. Aortic valve regurgitation is mild.  7. The inferior vena cava is dilated in size with <50% respiratory  variability, suggesting right atrial pressure of 15 mmHg.  8. Large left pleural effusion noted    Neuro/Psych neg Seizures CVA negative psych ROS   GI/Hepatic Neg liver ROS, PUD, GERD  ,  Endo/Other  negative endocrine ROSneg diabetes   Renal/GU Renal InsufficiencyRenal disease     Musculoskeletal negative musculoskeletal ROS (+)   Abdominal (+) - obese,   Peds  Hematology  (+) anemia ,   Anesthesia Other Findings Past Medical History: No date: AICD (automatic cardioverter/defibrillator) present No date: Bacteremia due to group B Streptococcus     Comment:  Recurrent admissions for group B Strepotococcus               bacteremia of unknown source with TEEs negative for               vegetation 06/2019 and 08/2019 03/24/2005: Biventricular ICD (implantable cardioverter- defibrillator) in place     Comment:  Implantation of a Medtronic Adapta ADDRO1, serial number              [id number] No date: CHF (congestive heart failure) (HCC) No date: CKD (chronic kidney disease), stage III ([place]) No date: Coronary artery disease     Comment:  a. s/p CABG 1986. b. Multiple PCIs/caths. c. 09/2013: s/p              PTCA and BMS to SVG-OM. No date: Deaf     Comment:  Requires sign language interpreter No date: Dysrhythmia No date: History of abdominal aortic aneurysm 1980: History of bleeding peptic ulcer 2013: History of epididymitis No date: HTN (hypertension) No date: Hydronephrosis with ureteropelvic junction obstruction 2009: Hydroureter on left No date: Hypertension No date: Ischemic cardiomyopathy     Comment:  a. Prior EF 30-35%, s/p BIV-ICD. b. 09/2013: EF 45-50%. No date: Moderate tricuspid regurgitation No date: PAF (paroxysmal atrial fibrillation) (HCC)     Comment:  Not on [place] 2/2 GIB 2002: Presence of permanent cardiac pacemaker  Comment:  Original placed in 2002 for CHB then 2007 and 2014               device change out No date: Prostate cancer ([place]) 1986: Status post coronary artery bypass grafting     Comment:  LIMA to the LAD, SVG to OM, SVG to RCA No date: Testicular swelling   Reproductive/Obstetrics                            Anesthesia Physical Anesthesia  Plan  ASA: III and emergent  Anesthesia Plan: General   Post-op Pain Management:    Induction: Intravenous  PONV Risk Score and Plan: 1 and Ondansetron and Dexamethasone  Airway Management Planned: Oral ETT  Additional Equipment:   Intra-op Plan:   Post-operative Plan: Extubation in OR  Informed Consent: I have reviewed the patients History and Physical, chart, labs and discussed the procedure including the risks, benefits and alternatives for the proposed anesthesia with the patient or authorized representative who has indicated his/her understanding and acceptance.     Dental advisory given  Plan Discussed with: CRNA and Anesthesiologist  Anesthesia Plan Comments:         Anesthesia Quick Evaluation

## 2020-02-06 NOTE — H&P (Signed)
[facility] SURGICAL HISTORY & PHYSICAL (cpt [phone])  HISTORY OF PRESENT ILLNESS (HPI):  History obtained through the help of an ASL interpretor.   85 y.o. male presented to [facility] ED today for abdominal pain. Patient reports the acute onset of abdominal pain earlier in the day. The pain was sharp in nature and a 10/10. He got no relief from this at home. He noted that movement and deep inspiration exacerbated the pain. He reported associated nausea but no emesis. No fever, chills, cough, congestion, SOB, or CP. On chart review, he was recently admitted to [facility] from 11/12 - 11/17 for respiratory failure from sepsis secondary to CHF vs PNA. He does have quite a significant cardiac history. He does also have a history of upper GI bleed secondary to ulceration, unsure of when this was. Work up in the ED this morning was concerning for pneumoperitoneum on CT Abdomen/Pelvis likely attributable to perorated duodenal ulcer. His laboratory work up is reassuring. There is evidence of cirrhosis on CT and he is a Childs B (Bilirubin 2.2, Albumin 2.8, INR 1.4, no ascites nor encephalopathy).   General surgery is consulted by emergency medicine physician Dr Duffy Bruce, MD for evaluation and management of perforated duodenal ulcer.  PAST MEDICAL HISTORY (PMH):  Past Medical History:  Diagnosis Date  . AICD (automatic cardioverter/defibrillator) present   . Bacteremia due to group B Streptococcus    Recurrent admissions for group B Strepotococcus bacteremia of unknown source with TEEs negative for vegetation 06/2019 and 08/2019  . Biventricular ICD (implantable cardioverter-defibrillator) in place 03/24/2005   Implantation of a Medtronic Adapta ADDRO1, serial number [id number]  . CHF (congestive heart failure) ([place])   . CKD (chronic kidney disease), stage III ([place])   . Coronary artery disease    a. s/p CABG 1986. b. Multiple PCIs/caths. c. 09/2013: s/p PTCA and BMS to SVG-OM.  [name]     Requires sign language interpreter  . Dysrhythmia   . History of abdominal aortic aneurysm   . History of bleeding peptic ulcer 1980  . History of epididymitis 2013  . HTN (hypertension)   . Hydronephrosis with ureteropelvic junction obstruction   . Hydroureter on left 2009  . Hypertension   . Ischemic cardiomyopathy    a. Prior EF 30-35%, s/p BIV-ICD. b. 09/2013: EF 45-50%.  . Moderate tricuspid regurgitation   . PAF (paroxysmal atrial fibrillation) (HCC)    Not on [place] 2/2 GIB  . Presence of permanent cardiac pacemaker 2002   Original placed in 2002 for CHB then 2007 and 2014 device change out  . Prostate cancer ([place])   . Status post coronary artery bypass grafting 1986   LIMA to the LAD, SVG to OM, SVG to RCA  . Testicular swelling     Reviewed. Otherwise negative.   PAST SURGICAL HISTORY ([place]):  Past Surgical History:  Procedure Laterality Date  . 2-D echocardiogram  11/20/2011   Ejection fraction 30-35% moderate concentric left ventricular hypertrophy. Left atrium is moderately dilated. Mild MR. Mild or  . BI-VENTRICULAR IMPLANTABLE CARDIOVERTER DEFIBRILLATOR N/A 12/16/2012   Procedure: BI-VENTRICULAR IMPLANTABLE CARDIOVERTER DEFIBRILLATOR  (CRT-D);  Surgeon: [name], MD;  Location: [facility] C[facility] LAB;  Service: Cardiovascular;  Laterality: N/A;  . BIV ICD INSERTION CRT-D N/A 09/01/2019   Procedure: BIV ICD INSERTION CRT-D;  Surgeon: [name], MD;  Location: [place] CV LAB;  Service: Cardiovascular;  Laterality: N/A;  . CARDIAC CATHETERIZATION  12/10/2011   SVG to OM widely patent.  LIMA to LAD patent  . CATARACT EXTRACTION W/PHACO Right 10/12/2017   Procedure: CATARACT EXTRACTION PHACO AND INTRAOCULAR LENS PLACEMENT (IOC);  Surgeon: Birder Robson, MD;  Location: [facility] ORS;  Service: Ophthalmology;  Laterality: Right;  [place] 00:57 AP% 15.9 CDE 9.07 Fluid pack lot # [id number]  . COLONOSCOPY N/A 07/13/2018   Procedure: COLONOSCOPY;  Surgeon: Toledo, Benay Pike, MD;   Location: [facility] ENDOSCOPY;  Service: Gastroenterology;  Laterality: N/A;  . CORONARY ARTERY BYPASS GRAFT  1986  . EMBOLIZATION N/A 01/18/2020   Procedure: EMBOLIZATION;  Surgeon: Algernon Huxley, MD;  Location: [place] CV LAB;  Service: Cardiovascular;  Laterality: N/A;  . ENTEROSCOPY N/A 09/14/2018   Procedure: ENTEROSCOPY;  Surgeon: Toledo, Benay Pike, MD;  Location: [facility] ENDOSCOPY;  Service: Gastroenterology;  Laterality: N/A;  symptomatic anemia, GI blood loss anemia, melena, positive small bowel capsule endoscopy showing source of bleeding   . ENTEROSCOPY N/A 08/22/2019   Procedure: ENTEROSCOPY;  Surgeon: Lin Landsman, MD;  Location: [facility] ENDOSCOPY;  Service: Gastroenterology;  Laterality: N/A;  . ESOPHAGOGASTRODUODENOSCOPY N/A 07/13/2018   Procedure: ESOPHAGOGASTRODUODENOSCOPY (EGD);  Surgeon: Toledo, Benay Pike, MD;  Location: [facility] ENDOSCOPY;  Service: Gastroenterology;  Laterality: N/A;  . ESOPHAGOGASTRODUODENOSCOPY N/A 09/14/2018   Procedure: ESOPHAGOGASTRODUODENOSCOPY (EGD);  Surgeon: Toledo, Benay Pike, MD;  Location: [facility] ENDOSCOPY;  Service: Gastroenterology;  Laterality: N/A;  SIGN LANAGUAGE INTERPRETER  . ESOPHAGOGASTRODUODENOSCOPY N/A 06/21/2019   Procedure: ESOPHAGOGASTRODUODENOSCOPY (EGD);  Surgeon: Toledo, Benay Pike, MD;  Location: [facility] ENDOSCOPY;  Service: Gastroenterology;  Laterality: N/A;  . ESOPHAGOGASTRODUODENOSCOPY N/A 01/15/2020   Procedure: ESOPHAGOGASTRODUODENOSCOPY (EGD);  Surgeon: Lucilla Lame, MD;  Location: [facility] ENDOSCOPY;  Service: Endoscopy;  Laterality: N/A;  . ESOPHAGOGASTRODUODENOSCOPY (EGD) WITH PROPOFOL N/A 05/27/2018   Procedure: ESOPHAGOGASTRODUODENOSCOPY (EGD) WITH PROPOFOL;  Surgeon: Clarene Essex, MD;  Location: [place];  Service: Endoscopy;  Laterality: N/A;  . ICD LEAD REMOVAL Left 08/25/2019   Procedure: ICD LEAD EXTRACTION;  Surgeon: [name], MD;  Location: [place];  Service: Cardiovascular;  Laterality: Left;  DR. Roxan Hockey BACK UP  . INSERT /  REPLACE / REMOVE PACEMAKER    . LEFT HEART CATHETERIZATION WITH CORONARY/GRAFT ANGIOGRAM N/A 12/10/2011   Procedure: LEFT HEART CATHETERIZATION WITH Beatrix Fetters;  Surgeon: Sanda Klein, MD;  Location: [place] CATH LAB;  Service: Cardiovascular;  Laterality: N/A;  . LEFT HEART CATHETERIZATION WITH CORONARY/GRAFT ANGIOGRAM N/A 09/25/2013   Procedure: LEFT HEART CATHETERIZATION WITH Beatrix Fetters;  Surgeon: Blane Ohara, MD;  Location: [facility] CATH LAB;  Service: Cardiovascular;  Laterality: N/A;  . MULTIPLE EXTRACTIONS WITH ALVEOLOPLASTY Bilateral 08/30/2019   Procedure: Extractionof tooth #'s 2, 28, and 31 with alveoloplasty and gross debridement of remaining teeth.;  Surgeon: [name], DDS;  Location: [place];  Service: Oral Surgery;  Laterality: Bilateral;  . Persantine Myoview  05/06/2010   Post-rest ejection fraction 30%. No significant ischemia demonstrated. Compared to previous study there is no significant change.  . TEE WITHOUT CARDIOVERSION N/A 06/22/2019   Procedure: TRANSESOPHAGEAL ECHOCARDIOGRAM (TEE);  Surgeon: Minna Merritts, MD;  Location: [facility] ORS;  Service: Cardiovascular;  Laterality: N/A;  . TEE WITHOUT CARDIOVERSION N/A 08/23/2019   Procedure: TRANSESOPHAGEAL ECHOCARDIOGRAM (TEE);  Surgeon: Kate Sable, MD;  Location: [facility] ORS;  Service: Cardiovascular;  Laterality: N/A;  . TEE WITHOUT CARDIOVERSION N/A 08/25/2019   Procedure: TRANSESOPHAGEAL ECHOCARDIOGRAM (TEE);  Surgeon: [name], MD;  Location: [facility] OR;  Service: Cardiovascular;  Laterality: N/A;  . TRANSURETHRAL RESECTION OF PROSTATE     s/p    Reviewed.  Otherwise negative.   MEDICATIONS:  Prior to Admission medications   Medication Sig Start Date End Date Taking? Authorizing Provider  acetaminophen (TYLENOL) 325 MG tablet Take 2 tablets (650 mg total) by mouth every 6 (six) hours as needed for mild pain or fever. 08/23/19   Loletha Grayer, MD  albuterol (PROVENTIL) (2.5 MG/3ML)  0.083% nebulizer solution Inhale 3 mLs into the lungs every 4 (four) hours as needed for wheezing or shortness of breath. 08/23/19   Loletha Grayer, MD  ferrous sulfate 325 (65 FE) MG tablet Take 1 tablet (325 mg total) by mouth 2 (two) times daily with a meal. Patient taking differently: Take 325 mg by mouth daily with breakfast.  09/11/19 01/26/20  Mercy Riding, MD  losartan (COZAAR) 25 MG tablet Take 25 mg by mouth daily.    [provider]  midodrine (PROAMATINE) 5 MG tablet TAKE 1 TABLET (5 MG TOTAL) BY MOUTH 3 (THREE) TIMES DAILY WITH MEALS. 01/23/20   [name], MD  Multiple Vitamin (MULTIVITAMIN WITH MINERALS) TABS tablet Take 1 tablet by mouth daily. 05/29/19   Ezekiel Slocumb, DO  Nystatin (GERHARDT'S BUTT CREAM) CREA Apply 1 application topically 2 (two) times daily. Patient taking differently: Apply 1 application topically as needed.  09/11/19   Mercy Riding, MD  ondansetron (ZOFRAN) 4 MG tablet Take 1 tablet (4 mg total) by mouth every 8 (eight) hours as needed for nausea or vomiting. Patient not taking: Reported on 01/26/2020 09/12/19 09/11/20  Mercy Riding, MD  pantoprazole (PROTONIX) 40 MG tablet Take 1 tablet (40 mg total) by mouth 2 (two) times daily. Patient not taking: Reported on 01/26/2020 01/21/20 02/20/20  Wyvonnia Dusky, MD  simvastatin (ZOCOR) 20 MG tablet TAKE 1 TABLET BY MOUTH EVERY DAY Patient taking differently: Take 20 mg by mouth daily at 6 PM.  04/07/19   Kilroy, Doreene Burke, PA-C  spironolactone (ALDACTONE) 25 MG tablet Take 1 tablet (25 mg total) by mouth daily. 08/01/19 01/26/20  Alisa Graff, FNP  torsemide (DEMADEX) 20 MG tablet Take 2 tablets (40 mg total) by mouth 2 (two) times daily. 09/11/19   Mercy Riding, MD     ALLERGIES:  Allergies  Allergen Reactions  . Entresto [Sacubitril-Valsartan] Swelling    And bruising of arm  . Phenazopyridine Nausea Only and Other (See Comments)    GI UPSET  . Ramipril Other (See Comments)    unk Other  reaction(s): Other (See Comments), Unknown unk     SOCIAL HISTORY:  Social History   Socioeconomic History  . Marital status: Married    Spouse name: Not on file  . Number of children: Not on file  . Years of education: Not on file  . Highest education level: Not on file  Occupational History  . Not on file  Tobacco Use  . Smoking status: Former Smoker    Quit date: 03/15/1985    Years since quitting: 34.9  . Smokeless tobacco: Never Used  Vaping Use  . Vaping Use: Never used  Substance and Sexual Activity  . Alcohol use: No    Comment: occas.  . Drug use: No  . Sexual activity: Yes  Other Topics Concern  . Not on file  Social History Narrative  . Not on file   Social Determinants of Health   Financial Resource Strain:   . Difficulty of Paying Living Expenses: Not on file  Food Insecurity:   . Worried About Charity fundraiser in the  Last Year: Not on file  . Ran Out of Food in the Last Year: Not on file  Transportation Needs:   . Lack of Transportation (Medical): Not on file  . Lack of Transportation (Non-Medical): Not on file  Physical Activity:   . Days of Exercise per Week: Not on file  . Minutes of Exercise per Session: Not on file  Stress:   . Feeling of Stress : Not on file  Social Connections:   . Frequency of Communication with Friends and Family: Not on file  . Frequency of Social Gatherings with Friends and Family: Not on file  . Attends Religious Services: Not on file  . Active Member of Clubs or Organizations: Not on file  . Attends Archivist Meetings: Not on file  . Marital Status: Not on file  Intimate Partner Violence:   . Fear of Current or Ex-Partner: Not on file  . Emotionally Abused: Not on file  . Physically Abused: Not on file  . Sexually Abused: Not on file     FAMILY HISTORY:  Family History  Problem Relation Age of Onset  . Hypertension Father     Otherwise negative.   REVIEW OF SYSTEMS:  Review of Systems   Constitutional: Negative for chills and fever.  HENT: Negative for congestion and sore throat.   Respiratory: Negative for cough and shortness of breath.   Cardiovascular: Negative for chest pain and palpitations.  Gastrointestinal: Positive for abdominal pain and nausea. Negative for blood in stool, constipation, diarrhea and vomiting.  All other systems reviewed and are negative.   VITAL SIGNS:  Temp:  [98.2 F (36.8 C)] 98.2 F (36.8 C) (11/23 0914) Pulse Rate:  [71-74] 71 (11/23 1011) Resp:  [18] 18 (11/23 1011) BP: (105-118)/(63-66) 118/63 (11/23 1011) SpO2:  [99 %-100 %] 100 % (11/23 1011) Weight:  [79 kg] 79 kg (11/23 0914)     Height: 6\' 2"  (188 cm) Weight: 79 kg BMI (Calculated): 22.35   PHYSICAL EXAM:  Physical Exam Vitals and nursing note reviewed.  Constitutional:      General: He is not in acute distress.    Appearance: He is well-developed and normal weight. He is not ill-appearing.     Comments: Patient deaf, ASL interpretor present  HENT:     Head: Normocephalic and atraumatic.  Eyes:     General: No scleral icterus.    Extraocular Movements: Extraocular movements intact.  Cardiovascular:     Rate and Rhythm: Normal rate.     Heart sounds: No murmur heard.   Pulmonary:     Effort: Pulmonary effort is normal. No respiratory distress.     Breath sounds: Normal breath sounds.  Chest:     Comments: Previous sternotomy incision is present Abdominal:     General: Abdomen is flat. There is no distension.     Palpations: Abdomen is soft.     Tenderness: There is abdominal tenderness in the epigastric area. There is no guarding or rebound.     Comments: Abdomen is soft, he is markedly tender over his epigastrium, no appreciable distension, he does appear peritonitic   Genitourinary:    Comments: Deferred Skin:    General: Skin is warm and dry.  Neurological:     General: No focal deficit present.     Mental Status: He is alert and oriented to person, place,  and time.  Psychiatric:        Mood and Affect: Mood normal.  Behavior: Behavior normal.     INTAKE/OUTPUT:  This shift: No intake/output data recorded.  Last 2 shifts: @IOLAST2SHIFTS @  Labs:  CBC Latest Ref Rng & Units 01/31/2020 01/31/2020 01/30/2020  WBC 4.0 - 10.5 K/uL 7.0 5.6 -  Hemoglobin 13.0 - 17.0 g/dL 10.3(L) 9.7(L) 9.3(L)  Hematocrit 39 - 52 % 33.4(L) 30.7(L) 30.0(L)  Platelets 150 - 400 K/uL 287 206 -   CMP Latest Ref Rng & Units 02/07/2020 01/31/2020 01/30/2020  Glucose 70 - 99 mg/dL 109(H) 90 93  BUN 8 - 23 mg/dL 16 13 13   Creatinine 0.61 - 1.24 mg/dL 1.12 1.05 1.03  Sodium 135 - 145 mmol/L 138 138 137  Potassium 3.5 - 5.1 mmol/L 4.7 3.7 3.7  Chloride 98 - 111 mmol/L 98 102 103  CO2 22 - 32 mmol/L 31 28 28   Calcium 8.9 - 10.3 mg/dL 9.2 7.9(L) 7.7(L)  Total Protein 6.5 - 8.1 g/dL 6.5 - -  Total Bilirubin 0.3 - 1.2 mg/dL 2.2(H) - -  Alkaline Phos 38 - 126 U/L 138(H) - -  AST 15 - 41 U/L 26 - -  ALT 0 - 44 U/L 13 - -     Imaging studies:   CT Abdomen/Pelvis (02/03/2020) personally reviewed with intra-abdominal free air concerning for likely perforated duodenal ulceration, significant atherosclerotic disease, moderate bilateral pleural effusions, and radiologist report reviewed below:  IMPRESSION: Perforated duodenal ulcer with free intraperitoneal air and fluid.  Cirrhotic liver.  Moderate-sized BILATERAL pleural effusions and bibasilar atelectasis.  Mild sigmoid diverticulosis.  BILATERAL renal cortical atrophy with cortical scarring and small nonobstructing calculus RIGHT kidney.  Extensive atherosclerotic disease including visceral artery.  Enlargement of cardiac chambers post CABG and ICD.  Aortic Atherosclerosis (ICD10-I70.0).   Assessment/Plan: (ICD-10's: K31.5) 85 y.o. male with marked abdominal pain found to have pneumoperitoneum attributable to likely perforated duodenal ulcer, complicated by pertinent comorbidities including  significant cardiovascular disease.    - Given his marked abdominal pain and pneumoperitoneum, I feel it is prudent to proceed emergently with surgical intervention. With his history of cirrhosis Ardine Eng B), we will attempt to preform this laparoscopically with the assistance of the robot, understanding there may still be a need for laparotomy. Using the help of an ASL interpretor I was able to convey this to the patient and family at bedside as well as the risks associated with surgery including, but not limited to, infection, bleeding, injury to other structures, need for additional procedures, need for prolonged intubation, and death. They verbalized understanding of these, all questions answered, and agreeable with proceeding.   - Start IV Abx (Zosyn)  - IVF resuscitation; may need lower rate secondary to CHF   - Start IV PPI  - Pain control prn; antiemetics prn  - Monitor abdominal examination  - will need hospitalist assistance for comorbidities  - DVT prophylaxis; hold  All of the above findings and recommendations were discussed with the patient and his family using the help of an ASL interpretor, and all of their questions were answered to their expressed satisfaction.  -- Edison Simon, PA-C [facility] 01/27/2020, 11:32 AM [phone] M-F: 7am - 4pm

## 2020-02-06 NOTE — Consult Note (Signed)
[facility] Medical Consultation  Thomas [name] BWG:[id number] DOB: Oct 30, 1934 DOA: 02/02/2020 PCP: [name], MD   Requesting physician: Dr. Dahlia Byes Date of consultation: 02/07/2020 Reason for consultation: management of complex medical issues following major GI surgery  Impression/Recommendations Active Problems:   Perforated duodenal ulcer ([place])   Cardiomyopathy, ischemic   Permanent atrial fibrillation ([place])   Chronic combined systolic and diastolic heart failure ([place])   Coronary artery disease of bypass graft of native heart with stable angina pectoris ([place])   CRI (chronic renal insufficiency), stage 3 (moderate) ([place])   Benign essential HTN   Bilateral deafness   Mixed hyperlipidemia    1. Perforated duodenal ulcer - patient present with acute perforation and peritonitis requiring emergency surgery. Complex surgery (see op-note) with Patch to duodenal perforation, pyloric diversion with creation of gastrojejunostomy and placement of jejunostomy feeding tube, cholecystectomy.  Plan Per General surgery  Agree with continue Zosyn coverage  Pain mgt per surgery  Agree with continuation IV PPI  2.  Cardiac - patient with combined HFrEF who had recent hospitalization for respiratory failure. He underwent thoracentesis  R&L for transudative effusion. Post procedure improved respiratory function. Permanent atrial fibrillation - not a candidate for anticoagulation. Has had good rate control Plan IV lasix 40 mg q 12 until able to resume oral medications: aldactone and torsemide  Continue telemetry - for RVR if BP stable IV CCB  Consider Cardiology consult  3. HTN - as outpatient was controlled on ARB Plan For elevation in BP resume losaartan 25 mg via J-tube  4. HLD - hold medication until patient stable and taking J-tube feeds.  [place] in-patient team will follow. Thank you for this consultation.  Chief Complaint: acute abdominal pain  HPI:  Thomas [name] is an 85 y/o  with a complex medical h/o including combined heart failure, mitral and tricuspid valvular disease with regurgitation, permanent a. 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 He was recently hospitalized for acute respiratory failure due to aspiration pneumonia and transudative pleural effusions. He underwent thoracentesis. He was treated with Abx. He made a good recovery and was ble to be d/c home 01/31/20. He was to resume aldactone and torsemide. He did well but developed acute abdominal pain leading to presentation to [facility] ED where evaluation revealed peritonitis and intraabdominal free air.  Review of Systems:  Patient obtunded/sedated and deaf - unable to give history  Past Medical History:  Diagnosis Date  . AICD (automatic cardioverter/defibrillator) present   . Bacteremia due to group B Streptococcus    Recurrent admissions for group B Strepotococcus bacteremia of unknown source with TEEs negative for vegetation 06/2019 and 08/2019  . Biventricular ICD (implantable cardioverter-defibrillator) in place 03/24/2005   Implantation of a Medtronic Adapta ADDRO1, serial number [id number]  . CHF (congestive heart failure) ([place])   . CKD (chronic kidney disease), stage III ([place])   . Coronary artery disease    a. s/p CABG 1986. b. Multiple PCIs/caths. c. 09/2013: s/p PTCA and BMS to SVG-OM.  [name]    Requires sign language interpreter  . Dysrhythmia   . History of abdominal aortic aneurysm   . History of bleeding peptic ulcer 1980  . History of epididymitis 2013  . HTN (hypertension)   . Hydronephrosis with ureteropelvic junction obstruction   . Hydroureter on left 2009  . Hypertension   . Ischemic cardiomyopathy    a. Prior EF 30-35%, s/p BIV-ICD. b. 09/2013:  EF 45-50%.  . Moderate tricuspid regurgitation   . PAF (paroxysmal atrial fibrillation) ([facility])    Not on [place] 2/2 GIB  . Presence of permanent cardiac pacemaker 2002    Original placed in 2002 for CHB then 2007 and 2014 device change out  . Prostate cancer ([place])   . Status post coronary artery bypass grafting 1986   LIMA to the LAD, SVG to OM, SVG to RCA  . Testicular swelling    Past Surgical History:  Procedure Laterality Date  . 2-D echocardiogram  11/20/2011   Ejection fraction 30-35% moderate concentric left ventricular hypertrophy. Left atrium is moderately dilated. Mild MR. Mild or  . BI-VENTRICULAR IMPLANTABLE CARDIOVERTER DEFIBRILLATOR N/A 12/16/2012   Procedure: BI-VENTRICULAR IMPLANTABLE CARDIOVERTER DEFIBRILLATOR  (CRT-D);  Surgeon: [name], MD;  Location: [facility] CATH LAB;  Service: Cardiovascular;  Laterality: N/A;  . BIV ICD INSERTION CRT-D N/A 09/01/2019   Procedure: BIV ICD INSERTION CRT-D;  Surgeon: [name], MD;  Location: [place] CV LAB;  Service: Cardiovascular;  Laterality: N/A;  . CARDIAC CATHETERIZATION  12/10/2011   SVG to OM widely patent.  LIMA to LAD patent  . CATARACT EXTRACTION W/PHACO Right 10/12/2017   Procedure: CATARACT EXTRACTION PHACO AND INTRAOCULAR LENS PLACEMENT (IOC);  Surgeon: Birder Robson, MD;  Location: [facility] ORS;  Service: Ophthalmology;  Laterality: Right;  [place] 00:57 AP% 15.9 CDE 9.07 Fluid pack lot # [id number]  . COLONOSCOPY N/A 07/13/2018   Procedure: COLONOSCOPY;  Surgeon: Toledo, Benay Pike, MD;  Location: [facility] ENDOSCOPY;  Service: Gastroenterology;  Laterality: N/A;  . CORONARY ARTERY BYPASS GRAFT  1986  . EMBOLIZATION N/A 01/18/2020   Procedure: EMBOLIZATION;  Surgeon: Algernon Huxley, MD;  Location: [place] CV LAB;  Service: Cardiovascular;  Laterality: N/A;  . ENTEROSCOPY N/A 09/14/2018   Procedure: ENTEROSCOPY;  Surgeon: Toledo, Benay Pike, MD;  Location: [facility] ENDOSCOPY;  Service: Gastroenterology;  Laterality: N/A;  symptomatic anemia, GI blood loss anemia, melena, positive small bowel capsule endoscopy showing source of bleeding   . ENTEROSCOPY N/A 08/22/2019   Procedure: ENTEROSCOPY;   Surgeon: Lin Landsman, MD;  Location: [facility] ENDOSCOPY;  Service: Gastroenterology;  Laterality: N/A;  . ESOPHAGOGASTRODUODENOSCOPY N/A 07/13/2018   Procedure: ESOPHAGOGASTRODUODENOSCOPY (EGD);  Surgeon: Toledo, Benay Pike, MD;  Location: [facility] ENDOSCOPY;  Service: Gastroenterology;  Laterality: N/A;  . ESOPHAGOGASTRODUODENOSCOPY N/A 09/14/2018   Procedure: ESOPHAGOGASTRODUODENOSCOPY (EGD);  Surgeon: Toledo, Benay Pike, MD;  Location: [facility] ENDOSCOPY;  Service: Gastroenterology;  Laterality: N/A;  SIGN LANAGUAGE INTERPRETER  . ESOPHAGOGASTRODUODENOSCOPY N/A 06/21/2019   Procedure: ESOPHAGOGASTRODUODENOSCOPY (EGD);  Surgeon: Toledo, Benay Pike, MD;  Location: [facility] ENDOSCOPY;  Service: Gastroenterology;  Laterality: N/A;  . ESOPHAGOGASTRODUODENOSCOPY N/A 01/15/2020   Procedure: ESOPHAGOGASTRODUODENOSCOPY (EGD);  Surgeon: Lucilla Lame, MD;  Location[facility] ENDOSCOPY;  Service: Endoscopy;  Laterality: N/A;  . ESOPHAGOGASTRODUODENOSCOPY (EGD) WITH PROPOFOL N/A 05/27/2018   Procedure: ESOPHAGOGASTRODUODENOSCOPY (EGD) WITH PROPOFOL;  Surgeon: Clarene Essex, MD;  Location: [place];  Service: Endoscopy;  Laterality: N/A;  . ICD LEAD REMOVAL Left 08/25/2019   Procedure: ICD LEAD EXTRACTION;  Surgeon: [name], MD;  Location: [place];  Service: Cardiovascular;  Laterality: Left;  DR. Roxan Hockey BACK UP  . INSERT / REPLACE / REMOVE PACEMAKER    . LEFT HEART CATHETERIZATION WITH CORONARY/GRAFT ANGIOGRAM N/A 12/10/2011   Procedure: LEFT HEART CATHETERIZATION WITH Beatrix Fetters;  Surgeon: Sanda Klein, MD;  Location: [place] CATH LAB;  Service: Cardiovascular;  Laterality: N/A;  . LEFT HEART CATHETERIZATION WITH CORONARY/GRAFT ANGIOGRAM N/A 09/25/2013  Procedure: LEFT HEART CATHETERIZATION WITH Beatrix Fetters;  Surgeon: Blane Ohara, MD;  Location: [facility] CATH LAB;  Service: Cardiovascular;  Laterality: N/A;  . MULTIPLE EXTRACTIONS WITH ALVEOLOPLASTY Bilateral 08/30/2019   Procedure: Extractionof  tooth #'s 2, 28, and 31 with alveoloplasty and gross debridement of remaining teeth.;  Surgeon: [name], DDS;  Location: [place];  Service: Oral Surgery;  Laterality: Bilateral;  . Persantine Myoview  05/06/2010   Post-rest ejection fraction 30%. No significant ischemia demonstrated. Compared to previous study there is no significant change.  . TEE WITHOUT CARDIOVERSION N/A 06/22/2019   Procedure: TRANSESOPHAGEAL ECHOCARDIOGRAM (TEE);  Surgeon: Minna Merritts, MD;  Location: [facility] ORS;  Service: Cardiovascular;  Laterality: N/A;  . TEE WITHOUT CARDIOVERSION N/A 08/23/2019   Procedure: TRANSESOPHAGEAL ECHOCARDIOGRAM (TEE);  Surgeon: Kate Sable, MD;  Location: [facility] ORS;  Service: Cardiovascular;  Laterality: N/A;  . TEE WITHOUT CARDIOVERSION N/A 08/25/2019   Procedure: TRANSESOPHAGEAL ECHOCARDIOGRAM (TEE);  Surgeon: [name], MD;  Location: [facility] OR;  Service: Cardiovascular;  Laterality: N/A;  . TRANSURETHRAL RESECTION OF PROSTATE     s/p   Social History:  reports that he quit smoking about 34 years ago. He has never used smokeless tobacco. He reports that he does not drink alcohol and does not use drugs.  Allergies  Allergen Reactions  . Entresto [Sacubitril-Valsartan] Swelling    And bruising of arm  . Phenazopyridine Nausea Only and Other (See Comments)    GI UPSET  . Ramipril Other (See Comments)    unk Other reaction(s): Other (See Comments), Unknown unk   Family History  Problem Relation Age of Onset  . Hypertension Father     Prior to Admission medications   Medication Sig Start Date End Date Taking? Authorizing Provider  acetaminophen (TYLENOL) 325 MG tablet Take 2 tablets (650 mg total) by mouth every 6 (six) hours as needed for mild pain or fever. 08/23/19  Yes Wieting, Richard, MD  albuterol (PROVENTIL) (2.5 MG/3ML) 0.083% nebulizer solution Inhale 3 mLs into the lungs every 4 (four) hours as needed for wheezing or shortness of breath. 08/23/19  Yes Wieting,  Richard, MD  ferrous sulfate 325 (65 FE) MG tablet Take 325 mg by mouth daily with breakfast.   Yes [provider]  losartan (COZAAR) 25 MG tablet Take 25 mg by mouth daily.   Yes [provider]  midodrine (PROAMATINE) 5 MG tablet TAKE 1 TABLET (5 MG TOTAL) BY MOUTH 3 (THREE) TIMES DAILY WITH MEALS. 01/23/20  Yes [name], MD  Multiple Vitamin (MULTIVITAMIN WITH MINERALS) TABS tablet Take 1 tablet by mouth daily. 05/29/19  Yes Nicole Kindred A, DO  Nystatin (GERHARDT'S BUTT CREAM) CREA Apply 1 application topically 2 (two) times daily. Patient taking differently: Apply 1 application topically as needed.  09/11/19  Yes Mercy Riding, MD  simvastatin (ZOCOR) 20 MG tablet TAKE 1 TABLET BY MOUTH EVERY DAY Patient taking differently: Take 20 mg by mouth daily at 6 PM.  04/07/19  Yes Kilroy, Doreene Burke, PA-C  spironolactone (ALDACTONE) 25 MG tablet Take 25 mg by mouth daily.   Yes [provider]  torsemide (DEMADEX) 20 MG tablet Take 2 tablets (40 mg total) by mouth 2 (two) times daily. 09/11/19  Yes Mercy Riding, MD   Physical Exam: Blood pressure (!) 97/56, pulse 70, temperature 97.8 F (36.6 C), temperature source Axillary, resp. rate 13, height 6\' 2"  (1.88 m), weight 79.6 kg, SpO2 98 %. Vitals:   02/09/2020 2100  01/22/2020 2122  BP: (!) 99/57 (!) 97/56  Pulse: 70 70  Resp: 16 13  Temp:    SpO2: 98% 98%     General:  Chronically ill emaciated elderly man who is obtunded  Eyes: rheumy  ENT: deferred  Neck: large pulsating right jugular vein - chronic finding, no thyromegaly  Cardiovascular: 2+ radial pulse, regular rate. II/VI mm RSB, III/VI high pitched mm upper LSB, III/VI low pitched mm lower LSB, mechanical sounding III/VI mm at apex. Trace pedal pulses.   Respiratory: no increased work of breathing. Decreased BS bilaterally, no rales, no wheezing  Abdomen: soft, hypoactive to absent BS, mid-line ventral surgical wound closed w/o sign of bleeding, J-tube  left abdomen.  Skin: poor turgor, no lesions  Musculoskeletal: thin, no deformity  Psychiatric: obtunded  Neurologic: no facial droop. Exam limited by decreased LOC  Labs on Admission:  Basic Metabolic Panel: Recent Labs  Lab 01/31/20 0426 01/20/2020 0920  NA 138 138  K 3.7 4.7  CL 102 98  CO2 28 31  GLUCOSE 90 109*  BUN 13 16  CREATININE 1.05 1.12  CALCIUM 7.9* 9.2  MG 2.0  --   PHOS 3.1  --    Liver Function Tests: Recent Labs  Lab 01/31/20 0426 02/13/2020 0920  AST  --  26  ALT  --  13  ALKPHOS  --  138*  BILITOT  --  2.2*  PROT  --  6.5  ALBUMIN 2.2* 2.8*   Recent Labs  Lab 02/11/2020 0920  LIPASE 30   No results for input(s): AMMONIA in the last 168 hours. CBC: Recent Labs  Lab 01/31/20 0426 01/23/2020 0920  WBC 5.6 7.0  NEUTROABS  --  6.1  HGB 9.7* 10.3*  HCT 30.7* 33.4*  MCV 87.5 88.6  PLT 206 287   Cardiac Enzymes: No results for input(s): CKTOTAL, CKMB, CKMBINDEX, TROPONINI in the last 168 hours. BNP: Invalid input(s): POCBNP CBG: Recent Labs  Lab 01/29/2020 2035  GLUCAP 145*    Radiological Exams on Admission: CT ABDOMEN PELVIS W CONTRAST  Result Date: 01/26/2020 CLINICAL DATA:  RIGHT lower quadrant abdominal pain with nausea; past history of coronary artery disease post bypass, pacemaker/ICD, CHF, ischemic cardiomyopathy, atrial fibrillation, prostate cancer, hypertension EXAM: CT ABDOMEN AND PELVIS WITH CONTRAST TECHNIQUE: Multidetector CT imaging of the abdomen and pelvis was performed using the standard protocol following bolus administration of intravenous contrast. Sagittal and coronal MPR images reconstructed from axial data set. CONTRAST:  174mL OMNIPAQUE IOHEXOL 300 MG/ML SOLN IV. No oral contrast. COMPARISON:  CT angio abdomen and pelvis 01/15/2020 FINDINGS: Lower chest: Moderate-sized BILATERAL pleural effusions and bibasilar atelectasis. Significant enlargement of cardiac chambers with postsurgical changes and pacemaker.  Hepatobiliary: Nodular hepatic margins consistent with cirrhosis. Low-attenuation lesion lateral segment LEFT lobe 2.7 x 2.5 cm image 15. Gallbladder and liver otherwise unremarkable. Pancreas: Atrophic Spleen: Calcified granulomata.  Otherwise normal. Adrenals/Urinary Tract: Adrenal glands normal. BILATERAL renal cortical atrophy. Cortical scarring and small nonobstructing calculus RIGHT kidney. Tiny LEFT renal cyst. No hydronephrosis or hydroureter. Bladder unremarkable. Stomach/Bowel: Mild sigmoid diverticulosis. Appendix not visualized. Remainder of colon unremarkable. Stomach decompressed, unable to accurately assess wall thickness. Thickening of duodenal bulb, which appears enlarged, with adjacent fluid and small amounts of extraluminal gas. Additionally, free intraperitoneal air. Findings consistent with perforated duodenal ulcer. Embolization coils present medial to duodenal bulb. Vascular/Lymphatic: Extensive atherosclerotic calcifications of coronary arteries per, aorta, visceral arteries, iliac arteries, femoral arteries. Prior endo stenting of abdominal aorta into the common iliac  arteries. Significant calcified plaque at the origins of the celiac artery, SMA, and renal arteries. Multiple small collaterals in LEFT pelvis. Reproductive: Minimal prostatic enlargement. Two metallic clips in prostate gland. Other: Small amount of free fluid in upper abdomen and pelvis. Free air as noted above, predominately RIGHT upper quadrant. No hernia. Musculoskeletal: Osseous demineralization. Degenerative disc and facet disease changes lumbar spine. IMPRESSION: Perforated duodenal ulcer with free intraperitoneal air and fluid. Cirrhotic liver. Moderate-sized BILATERAL pleural effusions and bibasilar atelectasis. Mild sigmoid diverticulosis. BILATERAL renal cortical atrophy with cortical scarring and small nonobstructing calculus RIGHT kidney. Extensive atherosclerotic disease including visceral artery. Enlargement of  cardiac chambers post CABG and ICD. Aortic Atherosclerosis (ICD10-I70.0). Critical Value/emergent results were called by telephone at the time of interpretation on 02/01/2020 at 11:25 am to provider DR. Duffy Bruce , who verbally acknowledged these results. Electronically Signed   By: Lavonia Dana M.D.   On: 01/19/2020 11:26    EKG: Independently reviewed. Ventricular pacing  Time spent: 70 min  Adella Hare [facility] Pager [phone]  If 7PM-7AM, please contact night-coverage [email or web address] Password [facility] 01/19/2020, 10:32 PM

## 2020-02-06 NOTE — ED Notes (Signed)
Pt taken to CT.

## 2020-02-06 NOTE — ED Triage Notes (Addendum)
BIB [facility] c/o RLQ pain this AM. EMS reports VSS, 20G to left arm started en route. Interpreter for ASL used for triage. Nausea. Denies emesis or diarrhea.     CBG 130

## 2020-02-06 NOTE — Transfer of Care (Signed)
Immediate Anesthesia Transfer of Care Note  Patient: Thomas [name]  Procedure(s) Performed: XI ROBOT ASSISTED DIAGNOSTIC LAPAROSCOPY (N/A ) EXPLORATORY LAPAROTOMY (N/A ) CHOLECYSTECTOMY  Patient Location: PACU  Anesthesia Type:General  Level of Consciousness: drowsy and patient cooperative  Airway & Oxygen Therapy: Patient Spontanous Breathing and Patient connected to face mask oxygen  Post-op Assessment: Report given to RN and Post -op Vital signs reviewed and stable  Post vital signs: Reviewed and stable  Last Vitals:  Vitals Value Taken Time  BP 109/64 02/09/2020 1916  Temp    Pulse 70 02/05/2020 1917  Resp 20 01/27/2020 1917  SpO2 99 % 02/01/2020 1917  Vitals shown include unvalidated device data.  Last Pain:  Vitals:   02/09/2020 1327  TempSrc: Temporal  PainSc: 3          Complications: No complications documented.

## 2020-02-07 ENCOUNTER — Encounter: Payer: Self-pay | Admitting: Surgery

## 2020-02-07 DIAGNOSIS — E43 Unspecified severe protein-calorie malnutrition: Secondary | ICD-10-CM | POA: Insufficient documentation

## 2020-02-07 DIAGNOSIS — I10 Essential (primary) hypertension: Secondary | ICD-10-CM | POA: Diagnosis not present

## 2020-02-07 DIAGNOSIS — I5042 Chronic combined systolic (congestive) and diastolic (congestive) heart failure: Secondary | ICD-10-CM | POA: Diagnosis not present

## 2020-02-07 DIAGNOSIS — K275 Chronic or unspecified peptic ulcer, site unspecified, with perforation: Secondary | ICD-10-CM

## 2020-02-07 DIAGNOSIS — I255 Ischemic cardiomyopathy: Secondary | ICD-10-CM | POA: Diagnosis not present

## 2020-02-07 LAB — PREPARE FRESH FROZEN PLASMA: Unit division: 0

## 2020-02-07 LAB — CBC
HCT: 29.1 % — ABNORMAL LOW (ref 39.0–52.0)
HCT: 29.5 % — ABNORMAL LOW (ref 39.0–52.0)
Hemoglobin: 8.9 g/dL — ABNORMAL LOW (ref 13.0–17.0)
Hemoglobin: 8.9 g/dL — ABNORMAL LOW (ref 13.0–17.0)
MCH: 27.6 pg (ref 26.0–34.0)
MCH: 27.6 pg (ref 26.0–34.0)
MCHC: 30.6 g/dL (ref 30.0–36.0)
MCHC: 30.2 g/dL (ref 30.0–36.0)
MCV: 90.4 fL (ref 80.0–100.0)
MCV: 91.3 fL (ref 80.0–100.0)
Platelets: 225 10*3/uL (ref 150–400)
Platelets: 232 10*3/uL (ref 150–400)
RBC: 3.22 MIL/uL — ABNORMAL LOW (ref 4.22–5.81)
RBC: 3.23 MIL/uL — ABNORMAL LOW (ref 4.22–5.81)
RDW: 19.2 % — ABNORMAL HIGH (ref 11.5–15.5)
RDW: 19.2 % — ABNORMAL HIGH (ref 11.5–15.5)
WBC: 13.4 10*3/uL — ABNORMAL HIGH (ref 4.0–10.5)
WBC: 11.3 10*3/uL — ABNORMAL HIGH (ref 4.0–10.5)
nRBC: 0 % (ref 0.0–0.2)
nRBC: 0 % (ref 0.0–0.2)

## 2020-02-07 LAB — PHOSPHORUS: Phosphorus: 6.4 mg/dL — ABNORMAL HIGH (ref 2.5–4.6)

## 2020-02-07 LAB — COMPREHENSIVE METABOLIC PANEL
ALT: 17 U/L (ref 0–44)
AST: 39 U/L (ref 15–41)
Albumin: 2.9 g/dL — ABNORMAL LOW (ref 3.5–5.0)
Alkaline Phosphatase: 92 U/L (ref 38–126)
Anion gap: 10 (ref 5–15)
BUN: 23 mg/dL (ref 8–23)
CO2: 27 mmol/L (ref 22–32)
Calcium: 8.3 mg/dL — ABNORMAL LOW (ref 8.9–10.3)
Chloride: 103 mmol/L (ref 98–111)
Creatinine, Ser: 1.52 mg/dL — ABNORMAL HIGH (ref 0.61–1.24)
GFR, Estimated: 45 mL/min — ABNORMAL LOW (ref >60)
Glucose, Bld: 135 mg/dL — ABNORMAL HIGH (ref 70–99)
Potassium: 5 mmol/L (ref 3.5–5.1)
Sodium: 140 mmol/L (ref 135–145)
Total Bilirubin: 1.7 mg/dL — ABNORMAL HIGH (ref 0.3–1.2)
Total Protein: 5.9 g/dL — ABNORMAL LOW (ref 6.5–8.1)

## 2020-02-07 LAB — BPAM FFP
Blood Product Expiration Date: 202111282359
Blood Product Expiration Date: 202111282359
ISSUE DATE / TIME: 202111231656
ISSUE DATE / TIME: 202111231656
Unit Type and Rh: 5100
Unit Type and Rh: 9500

## 2020-02-07 LAB — MAGNESIUM: Magnesium: 2.2 mg/dL (ref 1.7–2.4)

## 2020-02-07 LAB — GLUCOSE, CAPILLARY
Glucose-Capillary: 121 mg/dL — ABNORMAL HIGH (ref 70–99)
Glucose-Capillary: 122 mg/dL — ABNORMAL HIGH (ref 70–99)

## 2020-02-07 LAB — APTT: aPTT: 47 seconds — ABNORMAL HIGH (ref 24–36)

## 2020-02-07 LAB — PROTIME-INR
INR: 1.6 — ABNORMAL HIGH (ref 0.8–1.2)
Prothrombin Time: 18.1 seconds — ABNORMAL HIGH (ref 11.4–15.2)

## 2020-02-07 MED ORDER — ACETAMINOPHEN 160 MG/5ML PO SOLN
1000.0000 mg | Freq: Four times a day (QID) | ORAL | Status: DC
  Administered 2020-02-08 – 2020-02-16 (×24): 1000 mg
  Filled 2020-02-07 (×38): qty 40.6

## 2020-02-07 MED ORDER — FREE WATER
30.0000 mL | Status: DC
  Administered 2020-02-07 – 2020-02-11 (×24): 30 mL

## 2020-02-07 MED ORDER — OXYCODONE HCL 5 MG/5ML PO SOLN
5.0000 mg | ORAL | Status: DC | PRN
  Administered 2020-02-07 – 2020-02-11 (×6): 5 mg
  Filled 2020-02-07 (×8): qty 5

## 2020-02-07 MED ORDER — PIVOT 1.5 CAL PO LIQD
1000.0000 mL | ORAL | Status: DC
  Administered 2020-02-07 – 2020-02-08 (×2): 1000 mL
  Filled 2020-02-07: qty 1000

## 2020-02-07 MED ORDER — ACETAMINOPHEN 10 MG/ML IV SOLN
1000.0000 mg | Freq: Four times a day (QID) | INTRAVENOUS | Status: DC
  Administered 2020-02-07 – 2020-02-08 (×3): 1000 mg via INTRAVENOUS
  Filled 2020-02-07 (×4): qty 100

## 2020-02-07 MED ORDER — ALBUMIN HUMAN 25 % IV SOLN
25.0000 g | Freq: Once | INTRAVENOUS | Status: AC
  Administered 2020-02-07: 25 g via INTRAVENOUS
  Filled 2020-02-07: qty 100

## 2020-02-07 MED ORDER — GABAPENTIN 250 MG/5ML PO SOLN
200.0000 mg | Freq: Three times a day (TID) | ORAL | Status: DC
  Administered 2020-02-07 – 2020-02-16 (×23): 200 mg
  Filled 2020-02-07 (×32): qty 4

## 2020-02-07 MED ORDER — SODIUM CHLORIDE 0.9 % IV SOLN: INTRAVENOUS | Status: DC

## 2020-02-07 MED ORDER — GABAPENTIN 250 MG/5ML PO SOLN
200.0000 mg | Freq: Three times a day (TID) | ORAL | Status: DC
  Filled 2020-02-07 (×3): qty 4

## 2020-02-07 MED ORDER — FUROSEMIDE 10 MG/ML IJ SOLN: 40.0000 mg | Freq: Every day | INTRAMUSCULAR | Status: DC

## 2020-02-07 MED ORDER — ACETAMINOPHEN 160 MG/5ML PO SOLN: 1000.0000 mg | Freq: Four times a day (QID) | ORAL | Status: DC

## 2020-02-07 MED ORDER — GABAPENTIN 250 MG/5ML PO SOLN
200.0000 mg | Freq: Three times a day (TID) | ORAL | Status: DC
  Filled 2020-02-07 (×2): qty 4

## 2020-02-07 NOTE — Progress Notes (Signed)
Spoke with Pabon MD, orders for 48ml free water flush q4 hours with tube feed.

## 2020-02-07 NOTE — Progress Notes (Addendum)
Initial Nutrition Assessment  DOCUMENTATION CODES:   Severe malnutrition in context of chronic illness  INTERVENTION:  Initiate Pivot 1.5 Cal at trickle rate of 20 mL/hr via J-tube.  Once okay to advance tube feeds per surgery recommend: -Advance Pivot 1.5 Cal by 20 mL/hr every 12 hours to goal rate of 60 mL/hr (1440 mL goal daily volume) -Provides 2160 kcal, 135 grams of protein, 1080 mL H2O daily  Provide minimum free water flush of 30 mL Q4hrs to maintain tube patency. Further changes to free water flush per MD.  Monitor magnesium, potassium, and phosphorus daily for at least 3 days, MD to replete as needed, as pt is at risk for refeeding syndrome given severe malnutrition. Surgery aware of risk for refeeding syndrome and will monitor.  NUTRITION DIAGNOSIS:   Severe Malnutrition related to chronic illness (CHF, CKD) as evidenced by 17.7% weight loss over 5 months, severe fat depletion, severe muscle depletion.  GOAL:   Patient will meet greater than or equal to 90% of their needs  MONITOR:   Labs, Weight trends, TF tolerance, Skin, I & O's  REASON FOR ASSESSMENT:   Consult Enteral/tube feeding initiation and management  ASSESSMENT:   85 year old male with PMHx of CAD s/p CABG 1986 and multiple PCIs, paroxysmal A-fib, s/p placement of biventricular ICD, CKD stage III, HTN, CHF, deafness requiring sign language interpreter admitted with perforated duodenal ulcer s/p exploratory laparotomy, duodenal ulcer repair with Phillip Heal patch, pyloric exclusion gastrojejunostomy and omega loop, feeding jejunostomy, and open cholecystectomy on 11/23.   Initially attempted to meet with patient at bedside this morning but [employer] tablet was not available for use. Returned in the afternoon and wife was present in room, who also utilizes sign language. Had a very difficult time connecting with interpreter on tablet. Three times the connection was lost. Finally able to connect with  interpreter #[id number]. Patient was more lethargic in afternoon so history provided by wife. She reports patient has had a decreased appetite for a while now. She reports he eats 3-4 meals per day. He eats typical food such as chicken or meat with sides. After asking if intake has decreased over time wife does report patient has been eating smaller amounts over time, but unable to get any further details.   Wife reports patient's UBW was in the 180s and that he is now down to 175 lbs. Per review of weight history in chart patient was 96.7 kg (212.74 lbs) on 09/12/2019. He is currently documented to be 79.6 kg (175.49 lbs) but appears to weigh even less than this during assessment. He has lost at least 17.1 kg (17.7% body weight) over the past 5 months, which is significant for time frame.  Medications reviewed and include: Lasix 40 mg daily IV, Protonix, acetaminophen IV, NS at 50 mL/hr, Zosyn.  Labs reviewed: CBG 145, Creatinine 1.52, Phosphorus 6.4.  Enteral Access: NGT to LIS; J-tube (red rubber catheter) placed 11/23 during surgery  Discussed with RN and Surgeon. Plan is to initiate trickle tube feeds today per J-tube.  NUTRITION - FOCUSED PHYSICAL EXAM:    Most Recent Value  Orbital Region Severe depletion  Upper Arm Region Severe depletion  Thoracic and Lumbar Region Severe depletion  Buccal Region Severe depletion  Temple Region Severe depletion  Clavicle Bone Region Severe depletion  Clavicle and Acromion Bone Region Severe depletion  Scapular Bone Region Severe depletion  Dorsal Hand Severe depletion  Patellar Region Severe depletion  Anterior Thigh Region Severe depletion  Posterior Calf Region Severe depletion  Edema (RD Assessment) Mild  Hair Reviewed  Eyes Reviewed  Mouth Reviewed  Skin Reviewed  Nails Reviewed     Diet Order:   Diet Order            Diet NPO time specified  Diet effective now                EDUCATION NEEDS:   No education needs have been  identified at this time  Skin:  Skin Assessment: Skin Integrity Issues: Skin Integrity Issues:: Stage II, Incisions Stage II: buttocks (3cm x 1cm) Incisions: closed incision to abdomen with honeycomb dressing  Last BM:  PTA  Height:   Ht Readings from Last 1 Encounters:  01/19/2020 6\' 2"  (1.88 m)   Weight:   Wt Readings from Last 1 Encounters:  01/18/2020 79.6 kg   Ideal Body Weight:  86.4 kg  BMI:  Body mass index is 22.53 kg/m.  Estimated Nutritional Needs:   Kcal:  2200-2400  Protein:  120-135 grams  Fluid:  2 L/day  Jacklynn Barnacle, MS, RD, LDN Pager number available on Amion

## 2020-02-07 NOTE — Progress Notes (Addendum)
Pt settled into room. Pt oriented to room and assessment completed using translator for Sign Language. Pt requesting pain medication, no other questions at this time.

## 2020-02-07 NOTE — Progress Notes (Signed)
[place] Hospital Day(s): 1.   Post op day(s): 1 Day Post-Op.   Interval History:  Patient seen and examined no acute events or new complaints overnight.  Patient has been somnolent post-operatively, does not participate Leukocytosis this morning to 11k; no fever, most likely reactive in setting of recent surgery Hgb stable at 8.9, combination from OR and dilutional PLT stable at 232K AKI this morning, sCr - 1.52; UO - 850 ccs Hyperphosphatemia to 6.4 otherwise no significant electrolyte derangements Hypoalbuminemia consistent with cirrhosis, 2.9 NGT with 100 ccs out; blue in color consistent with methylene blue used intraoperatively  Surgical drain with 80 ccs in last 24 hours; serosanguinous Continues on Zosyn   Vital signs in last 24 hours: [min-max] current  Temp:  [97.8 F (36.6 C)-99 F (37.2 C)] 98.6 F (37 C) (11/24 0200) Pulse Rate:  [69-83] 70 (11/24 0500) Resp:  [13-32] 17 (11/24 0500) BP: (95-118)/(56-83) 102/65 (11/24 0500) SpO2:  [95 %-100 %] 100 % (11/24 0500) Weight:  [79 kg-79.6 kg] 79.6 kg (11/23 2036)     Height: 6\' 2"  (188 cm) Weight: 79.6 kg BMI (Calculated): 22.52   Intake/Output last 2 shifts:  11/23 0701 - 11/24 0700 In: 3070.7 [I.V.:1815.3; Blood:508; IV Piggyback:747.4] Out: 1180 [Urine:850; Emesis/NG output:100; Drains:80; Blood:150]   Physical Exam:  Constitutional: resting comfortably, somnolent HEENT: NGT in place, output blue in color consistent with intra-operative methylene blue Respiratory: breathing non-labored at rest  Cardiovascular: regular rate and sinus rhythm  Gastrointestinal: Soft, does not appear to be overtly tender, non-distended, no rebound/guarding, JP in the RLQ with serosanguinous output, jejunostomy tube in the left mid-abdomen, this is capped currently Genitourinary: Foley in place Integumentary: Laparoscopic and laparotomy incisions are CDI with staples, no drainage or  erythema  Labs:  CBC Latest Ref Rng & Units 02/07/2020 02/07/2020 01/24/2020  WBC 4.0 - 10.5 K/uL 11.3(H) 13.4(H) 7.0  Hemoglobin 13.0 - 17.0 g/dL 8.9(L) 8.9(L) 10.3(L)  Hematocrit 39 - 52 % 29.5(L) 29.1(L) 33.4(L)  Platelets 150 - 400 K/uL 232 225 287   CMP Latest Ref Rng & Units 01/15/2020 01/31/2020 01/30/2020  Glucose 70 - 99 mg/dL 109(H) 90 93  BUN 8 - 23 mg/dL 16 13 13   Creatinine 0.61 - 1.24 mg/dL 1.12 1.05 1.03  Sodium 135 - 145 mmol/L 138 138 137  Potassium 3.5 - 5.1 mmol/L 4.7 3.7 3.7  Chloride 98 - 111 mmol/L 98 102 103  CO2 22 - 32 mmol/L 31 28 28   Calcium 8.9 - 10.3 mg/dL 9.2 7.9(L) 7.7(L)  Total Protein 6.5 - 8.1 g/dL 6.5 - -  Total Bilirubin 0.3 - 1.2 mg/dL 2.2(H) - -  Alkaline Phos 38 - 126 U/L 138(H) - -  AST 15 - 41 U/L 26 - -  ALT 0 - 44 U/L 13 - -     Imaging studies: No new pertinent imaging studies   Assessment/Plan:  85 y.o. male overall doing reasonably well 1 Day Post-Op s/p exploratory laparotomy, duodenal ulcer repair with Phillip Heal patch, pyloric exclusion gastrojejunostomy and omega loop, feeding jejunostomy, and open cholecystectomy for perforated duodenal ulcer, complicated by a multitude pertinent comorbidities.   - Okay to begin trickle feeds today through jejunostomy tube  - Recommend maintaining NGT through the remainder of the week on LIS; we will plan UGI on Monday (11/29) to evaluate anastomosis prior to removal  - Continue IV Zosyn  - Continue IV PPI; 40 mg BID   - Monitor abdominal examination; on-going bowel function  -  Pain control prn; antiemetics prn  - Maintain foley; monitor and record ouput  - Monitor renal function; morning labs  - Appreciate medicine assistance with comorbid conditions conditions   - Mobilize once feasible  - stable for floor status  All of the above findings and recommendations were discussed with the medical team. No family at bedside, patient somnolent.   -- Edison Simon, PA-C [facility] 02/07/2020, 7:10 AM [phone] M-F: 7am - 4pm

## 2020-02-07 NOTE — Progress Notes (Signed)
+ [place] at [place] NAME: Thomas [name]    MR#:  [id number]  DATE OF BIRTH:  [birth year]  SUBJECTIVE:  last sign language interpreter. Wife in the room. Patient is postop day one perforated ulcer repair.  Hemodynamically stable. To be started on tube feeding. Per surgery transfer out to medical floor. Complains of dry mouth.  REVIEW OF SYSTEMS:   Review of Systems  Unable to perform ROS: Language  Constitutional: Negative for chills, fever and weight loss.  HENT: Negative for ear discharge, ear pain and nosebleeds.   Eyes: Negative for blurred vision, pain and discharge.  Respiratory: Negative for sputum production, shortness of breath, wheezing and stridor.   Cardiovascular: Negative for chest pain, palpitations, orthopnea and PND.  Gastrointestinal: Negative for abdominal pain, diarrhea, nausea and vomiting.  Genitourinary: Negative for frequency and urgency.  Musculoskeletal: Negative for back pain and joint pain.  Neurological: Negative for sensory change, speech change, focal weakness and weakness.  Psychiatric/Behavioral: Negative for depression and hallucinations. The patient is not nervous/anxious.    Tolerating Diet:TF to be started Tolerating PT: pending  DRUG ALLERGIES:   Allergies  Allergen Reactions  . Entresto [Sacubitril-Valsartan] Swelling    And bruising of arm  . Phenazopyridine Nausea Only and Other (See Comments)    GI UPSET  . Ramipril Other (See Comments)    unk Other reaction(s): Other (See Comments), Unknown unk    VITALS:  Blood pressure 102/65, pulse 70, temperature 98.4 F (36.9 C), temperature source Oral, resp. rate 17, height 6\' 2"  (1.88 m), weight 79.6 kg, SpO2 100 %.  PHYSICAL EXAMINATION:   Physical Exam  GENERAL:  85 y.o.-year-old patient lying in the bed with no acute distress. Appears chronically ill thin cachectic malnourished HEENT: Head atraumatic, normocephalic. Oropharynx and  nasopharynx clear. Dry oral mucosa LUNGS: Normal breath sounds bilaterally, no wheezing, rales, rhonchi. No use of accessory muscles of respiration.  CARDIOVASCULAR: S1, S2 normal. No murmurs, rubs, or gallops.  ABDOMEN: Soft, nontender, nondistended. Bowel sounds present. No organomegaly or mass. G-tube, Foley+ EXTREMITIES: No cyanosis, clubbing or edema b/l.    NEUROLOGIC: grossly nonfocal. Very deconditioned. Moves all extremities well. PSYCHIATRIC:  patient is alert and awake SKIN: No obvious rash, lesion, or ulcer. Per RN  LABORATORY PANEL:  CBC Recent Labs  Lab 02/07/20 0637  WBC 11.3*  HGB 8.9*  HCT 29.5*  PLT 232    Chemistries  Recent Labs  Lab 02/07/20 0637  NA 140  K 5.0  CL 103  CO2 27  GLUCOSE 135*  BUN 23  CREATININE 1.52*  CALCIUM 8.3*  MG 2.2  AST 39  ALT 17  ALKPHOS 92  BILITOT 1.7*   Cardiac Enzymes No results for input(s): TROPONINI in the last 168 hours. RADIOLOGY:  CT ABDOMEN PELVIS W CONTRAST  Result Date: 01/23/2020 CLINICAL DATA:  RIGHT lower quadrant abdominal pain with nausea; past history of coronary artery disease post bypass, pacemaker/ICD, CHF, ischemic cardiomyopathy, atrial fibrillation, prostate cancer, hypertension EXAM: CT ABDOMEN AND PELVIS WITH CONTRAST TECHNIQUE: Multidetector CT imaging of the abdomen and pelvis was performed using the standard protocol following bolus administration of intravenous contrast. Sagittal and coronal MPR images reconstructed from axial data set. CONTRAST:  129mL OMNIPAQUE IOHEXOL 300 MG/ML SOLN IV. No oral contrast. COMPARISON:  CT angio abdomen and pelvis 01/15/2020 FINDINGS: Lower chest: Moderate-sized BILATERAL pleural effusions and bibasilar atelectasis. Significant enlargement of cardiac chambers with postsurgical changes and pacemaker. Hepatobiliary: Nodular hepatic margins  consistent with cirrhosis. Low-attenuation lesion lateral segment LEFT lobe 2.7 x 2.5 cm image 15. Gallbladder and liver  otherwise unremarkable. Pancreas: Atrophic Spleen: Calcified granulomata.  Otherwise normal. Adrenals/Urinary Tract: Adrenal glands normal. BILATERAL renal cortical atrophy. Cortical scarring and small nonobstructing calculus RIGHT kidney. Tiny LEFT renal cyst. No hydronephrosis or hydroureter. Bladder unremarkable. Stomach/Bowel: Mild sigmoid diverticulosis. Appendix not visualized. Remainder of colon unremarkable. Stomach decompressed, unable to accurately assess wall thickness. Thickening of duodenal bulb, which appears enlarged, with adjacent fluid and small amounts of extraluminal gas. Additionally, free intraperitoneal air. Findings consistent with perforated duodenal ulcer. Embolization coils present medial to duodenal bulb. Vascular/Lymphatic: Extensive atherosclerotic calcifications of coronary arteries per, aorta, visceral arteries, iliac arteries, femoral arteries. Prior endo stenting of abdominal aorta into the common iliac arteries. Significant calcified plaque at the origins of the celiac artery, SMA, and renal arteries. Multiple small collaterals in LEFT pelvis. Reproductive: Minimal prostatic enlargement. Two metallic clips in prostate gland. Other: Small amount of free fluid in upper abdomen and pelvis. Free air as noted above, predominately RIGHT upper quadrant. No hernia. Musculoskeletal: Osseous demineralization. Degenerative disc and facet disease changes lumbar spine. IMPRESSION: Perforated duodenal ulcer with free intraperitoneal air and fluid. Cirrhotic liver. Moderate-sized BILATERAL pleural effusions and bibasilar atelectasis. Mild sigmoid diverticulosis. BILATERAL renal cortical atrophy with cortical scarring and small nonobstructing calculus RIGHT kidney. Extensive atherosclerotic disease including visceral artery. Enlargement of cardiac chambers post CABG and ICD. Aortic Atherosclerosis (ICD10-I70.0). Critical Value/emergent results were called by telephone at the time of interpretation  on 01/30/2020 at 11:25 am to provider DR. Duffy Bruce , who verbally acknowledged these results. Electronically Signed   By: Lavonia Dana M.D.   On: 01/25/2020 11:26   ASSESSMENT AND PLAN:   Mr. 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 Patient came in with acute abdominal pain leading to presentation to [facility] ED where evaluation revealed peritonitis and intraabdominal free air.  1. Perforated duodenal ulcer - patient present with acute perforation and peritonitis requiring emergency surgery. Complex surgery (see op-note) with Patch to duodenal perforation, pyloric diversion with creation of gastrojejunostomy and placement of jejunostomy feeding tube, cholecystectomy. -cont  with continue Zosyn coverage   -Pain mgt per surgery   - continuation IV PPI  2. chronic systolic congestive heart failure with history of moderate to severe tricuspid regurgitation, moderate mitral valve regurgitation.  -Patient appears euvolemic -recently underwent thoracentesis  R&L for transudative effusion. Post procedure improved respiratory function. -Resume IV Lasix daily. -- At home patient is on Aldactone and torsemide  3. Permanent atrial fibrillation - not a candidate for anticoagulation.  -Has had good rate control. -Follows with [facility] cardiology. Will consider cardiology consultation if needed.          4. HTN - as outpatient was controlled on ARB -- hold BP meds since blood pressure is low/soft.  5. HLD - hold medication until patient stable and taking J-tube feeds.  6. Chronic stage IIIa -creatinine stable at 1.5 -avoid nephrotoxins  7. Severe malnutrition -Nutrition Status: Nutrition Problem: Severe Malnutrition Etiology: chronic illness (CHF, CKD) Signs/Symptoms: percent weight  loss, severe fat depletion, severe muscle depletion Percent weight loss: 17.7 % Interventions: Refer to RD note for recommendations follow dietitian recommendations     Procedures: laparoscopic repair of duodenal ulcer, G-tube placement,  open cholecystectomy. Family communication : wife in the room CODE STATUS: full DVT Prophylaxis :per surgery (primary team)  Status is: Inpatient       TOTAL TIME TAKING CARE OF THIS PATIENT: 25* minutes.  >50% time spent on counselling and coordination of care  Note: This dictation was prepared with Dragon dictation along with smaller phrase technology. Any transcriptional errors that result from this process are unintentional.  Fritzi Mandes M.D    [facility]   CC: Primary care physician; [name], MDPatient ID: [name], male   DOB: [birth year], 85 y.o.   MRN: [id number]

## 2020-02-07 NOTE — Progress Notes (Signed)
Provider Pabon made aware that patient is still obtunded, current vital signs, and that labs have not  been drawn since 0900. Provider instructed to monitor for any changes and not to order any new labs.

## 2020-02-07 NOTE — Progress Notes (Signed)
Report called to RN on 1C, wife at bedside and aware of the transfer. VS wnl at the time of transfer. MD aware of the transfer.

## 2020-02-08 DIAGNOSIS — I10 Essential (primary) hypertension: Secondary | ICD-10-CM | POA: Diagnosis not present

## 2020-02-08 DIAGNOSIS — I5042 Chronic combined systolic (congestive) and diastolic (congestive) heart failure: Secondary | ICD-10-CM | POA: Diagnosis not present

## 2020-02-08 DIAGNOSIS — K275 Chronic or unspecified peptic ulcer, site unspecified, with perforation: Secondary | ICD-10-CM | POA: Diagnosis not present

## 2020-02-08 DIAGNOSIS — I255 Ischemic cardiomyopathy: Secondary | ICD-10-CM | POA: Diagnosis not present

## 2020-02-08 LAB — GLUCOSE, CAPILLARY
Glucose-Capillary: 113 mg/dL — ABNORMAL HIGH (ref 70–99)
Glucose-Capillary: 113 mg/dL — ABNORMAL HIGH (ref 70–99)
Glucose-Capillary: 125 mg/dL — ABNORMAL HIGH (ref 70–99)
Glucose-Capillary: 126 mg/dL — ABNORMAL HIGH (ref 70–99)
Glucose-Capillary: 130 mg/dL — ABNORMAL HIGH (ref 70–99)
Glucose-Capillary: 90 mg/dL (ref 70–99)

## 2020-02-08 LAB — CBC
HCT: 29.8 % — ABNORMAL LOW (ref 39.0–52.0)
Hemoglobin: 9.2 g/dL — ABNORMAL LOW (ref 13.0–17.0)
MCH: 28.1 pg (ref 26.0–34.0)
MCHC: 30.9 g/dL (ref 30.0–36.0)
MCV: 91.1 fL (ref 80.0–100.0)
Platelets: 302 10*3/uL (ref 150–400)
RBC: 3.27 MIL/uL — ABNORMAL LOW (ref 4.22–5.81)
RDW: 19.8 % — ABNORMAL HIGH (ref 11.5–15.5)
WBC: 13.3 10*3/uL — ABNORMAL HIGH (ref 4.0–10.5)
nRBC: 0 % (ref 0.0–0.2)

## 2020-02-08 LAB — COMPREHENSIVE METABOLIC PANEL
ALT: 17 U/L (ref 0–44)
AST: 28 U/L (ref 15–41)
Albumin: 2.7 g/dL — ABNORMAL LOW (ref 3.5–5.0)
Alkaline Phosphatase: 85 U/L (ref 38–126)
Anion gap: 10 (ref 5–15)
BUN: 35 mg/dL — ABNORMAL HIGH (ref 8–23)
CO2: 27 mmol/L (ref 22–32)
Calcium: 8.6 mg/dL — ABNORMAL LOW (ref 8.9–10.3)
Chloride: 105 mmol/L (ref 98–111)
Creatinine, Ser: 1.91 mg/dL — ABNORMAL HIGH (ref 0.61–1.24)
GFR, Estimated: 35 mL/min — ABNORMAL LOW (ref >60)
Glucose, Bld: 140 mg/dL — ABNORMAL HIGH (ref 70–99)
Potassium: 4.9 mmol/L (ref 3.5–5.1)
Sodium: 142 mmol/L (ref 135–145)
Total Bilirubin: 1.5 mg/dL — ABNORMAL HIGH (ref 0.3–1.2)
Total Protein: 6 g/dL — ABNORMAL LOW (ref 6.5–8.1)

## 2020-02-08 LAB — PHOSPHORUS: Phosphorus: 5.5 mg/dL — ABNORMAL HIGH (ref 2.5–4.6)

## 2020-02-08 LAB — MAGNESIUM: Magnesium: 2.3 mg/dL (ref 1.7–2.4)

## 2020-02-08 NOTE — Progress Notes (Signed)
[place] Hospital Day(s): 2.   Post op day(s): 2 Days Post-Op.   Interval History:  Patient seen and examined; evaluation held with the assistance of ASL interpreter via [employer]. No acute events or new complaints overnight.  He is much more alert an interactive this morning. He complains of being very thirsty Leukocytosis roughly stable at 13.3 AKI slightly worse this AM; UO - 9000 ccs NGT with 200 ccs out Surgical drain with 40 ccs in last 24 hours; serosanguinous Continues on Zosyn   Vital signs in last 24 hours: [min-max] current  Temp:  [97.1 F (36.2 C)-98.6 F (37 C)] 97.1 F (36.2 C) (11/25 0814) Pulse Rate:  [68-97] 70 (11/25 0814) Resp:  [15-18] 18 (11/25 0814) BP: (98-114)/(61-83) 108/68 (11/25 0814) SpO2:  [87 %-100 %] 87 % (11/25 0814)     Height: 6\' 2"  (188 cm) Weight: 79.6 kg BMI (Calculated): 22.52   Intake/Output last 2 shifts:  11/24 0701 - 11/25 0700 In: 200 [IV Piggyback:200] Out: 1140 [Urine:900; Emesis/NG output:200; Drains:40]   Physical Exam:  Constitutional: resting comfortably, easily roused and is quite animated this morning HEENT: NGT in place Respiratory: breathing non-labored at rest  Cardiovascular: regular rate and sinus rhythm  Gastrointestinal: Soft, expected amount of post-operative tenderness, non-distended, no rebound/guarding, JP in the RLQ with serosanguinous output, jejunostomy tube in the left mid-abdomen, with TF running Genitourinary: Foley in place Integumentary: Laparoscopic and laparotomy incisions are CDI with staples, no drainage or erythema  Labs:  CBC Latest Ref Rng & Units 02/08/2020 02/07/2020 02/07/2020  WBC 4.0 - 10.5 K/uL 13.3(H) 11.3(H) 13.4(H)  Hemoglobin 13.0 - 17.0 g/dL 9.2(L) 8.9(L) 8.9(L)  Hematocrit 39 - 52 % 29.8(L) 29.5(L) 29.1(L)  Platelets 150 - 400 K/uL 302 232 225   CMP Latest Ref Rng & Units 02/08/2020 02/07/2020 01/28/2020  Glucose 70 - 99 mg/dL 140(H)  135(H) 109(H)  BUN 8 - 23 mg/dL 35(H) 23 16  Creatinine 0.61 - 1.24 mg/dL 1.91(H) 1.52(H) 1.12  Sodium 135 - 145 mmol/L 142 140 138  Potassium 3.5 - 5.1 mmol/L 4.9 5.0 4.7  Chloride 98 - 111 mmol/L 105 103 98  CO2 22 - 32 mmol/L 27 27 31   Calcium 8.9 - 10.3 mg/dL 8.6(L) 8.3(L) 9.2  Total Protein 6.5 - 8.1 g/dL 6.0(L) 5.9(L) 6.5  Total Bilirubin 0.3 - 1.2 mg/dL 1.5(H) 1.7(H) 2.2(H)  Alkaline Phos 38 - 126 U/L 85 92 138(H)  AST 15 - 41 U/L 28 39 26  ALT 0 - 44 U/L 17 17 13      Imaging studies: No new pertinent imaging studies   Assessment/Plan:  85 y.o. male overall doing surprisingly well 2 Days Post-Op s/p exploratory laparotomy, duodenal ulcer repair with Phillip Heal patch, pyloric exclusion gastrojejunostomy and omega loop, feeding jejunostomy, and open cholecystectomy for perforated duodenal ulcer, complicated by a multitude pertinent comorbidities.   - Okay to advance TF as tolerated  - Maintin NGT through the remainder of the week on LIS; we will plan UGI on Monday (11/29) to evaluate anastomosis prior to removal  - Continue IV Zosyn  - Continue IV PPI; 40 mg BID   - Monitor abdominal examination; on-going bowel function  - Pain control prn; antiemetics prn  - Maintain foley; monitor and record ouput  - Monitor renal function; morning labs; may need to hold or decrease Lasix dose for worsening renal function  - Appreciate medicine assistance with comorbid conditions conditions   - Mobilize  - OK for  limited swabs for oral comfort

## 2020-02-08 NOTE — Progress Notes (Signed)
+ [place] at [place] NAME: Thomas [name]    MR#:  [id number]  DATE OF BIRTH:  [birth year]  SUBJECTIVE:  last sign language interpreter. No family in the room earlier  Patient is postop day 2 perforated ulcer repair.  Hemodynamically stable.  Tolerating tube feeding.  Complains of dry mouth and being thirsty  REVIEW OF SYSTEMS:   Review of Systems  Unable to perform ROS: Language  Constitutional: Negative for chills, fever and weight loss.  HENT: Negative for ear discharge, ear pain and nosebleeds.   Eyes: Negative for blurred vision, pain and discharge.  Respiratory: Negative for sputum production, shortness of breath, wheezing and stridor.   Cardiovascular: Negative for chest pain, palpitations, orthopnea and PND.  Gastrointestinal: Negative for abdominal pain, diarrhea, nausea and vomiting.  Genitourinary: Negative for frequency and urgency.  Musculoskeletal: Negative for back pain and joint pain.  Neurological: Negative for sensory change, speech change, focal weakness and weakness.  Psychiatric/Behavioral: Negative for depression and hallucinations. The patient is not nervous/anxious.    Tolerating Diet:TF to be started Tolerating PT: pending  DRUG ALLERGIES:   Allergies  Allergen Reactions  . Entresto [Sacubitril-Valsartan] Swelling    And bruising of arm  . Phenazopyridine Nausea Only and Other (See Comments)    GI UPSET  . Ramipril Other (See Comments)    unk Other reaction(s): Other (See Comments), Unknown unk    VITALS:  Blood pressure 101/68, pulse 69, temperature (!) 97.1 F (36.2 C), temperature source Axillary, resp. rate 20, height 6\' 2"  (1.88 m), weight 79.6 kg, SpO2 91 %.  PHYSICAL EXAMINATION:   Physical Exam  GENERAL:  85 y.o.-year-old patient lying in the bed with no acute distress.appear weak and deconditioned  Appears chronically ill, thin, cachectic, malnourished HEENT: Head atraumatic,  normocephalic. Oropharynx and nasopharynx clear. Dry oral mucosa LUNGS: Normal breath sounds bilaterally, no wheezing, rales, rhonchi. No use of accessory muscles of respiration.  CARDIOVASCULAR: S1, S2 normal. No murmurs, rubs, or gallops.  ABDOMEN: Soft, nontender, nondistended. Bowel sounds present. No organomegaly or mass. G-tube, Foley+ EXTREMITIES: No cyanosis, clubbing or edema b/l.    NEUROLOGIC: grossly nonfocal. Very deconditioned. Moves all extremities well. PSYCHIATRIC:  patient is alert and awake SKIN: No obvious rash, lesion, or ulcer. Per RN  LABORATORY PANEL:  CBC Recent Labs  Lab 02/08/20 0532  WBC 13.3*  HGB 9.2*  HCT 29.8*  PLT 302    Chemistries  Recent Labs  Lab 02/08/20 0532  NA 142  K 4.9  CL 105  CO2 27  GLUCOSE 140*  BUN 35*  CREATININE 1.91*  CALCIUM 8.6*  MG 2.3  AST 28  ALT 17  ALKPHOS 85  BILITOT 1.5*   Cardiac Enzymes No results for input(s): TROPONINI in the last 168 hours. RADIOLOGY:  No results found. 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 Patient came in with acute abdominal pain leading to presentation to [facility] ED where evaluation revealed peritonitis and intraabdominal free air.  1.Perforated duodenal ulcer - patient present with acute perforation and peritonitis requiring emergency surgery. Complex surgery (see op-note) with Patch to duodenal perforation, pyloric diversion with creation of gastrojejunostomy and placement of jejunostomy feeding tube, cholecystectomy. -cont  with continue Zosyn coverage   -  Pain mgt per surgery   - continuation IV PPI -Post-op day 2  2. chronic systolic congestive heart failure with history of moderate to severe tricuspid regurgitation, moderate mitral valve  regurgitation.  -Patient appears euvolemic -recently underwent thoracentesis  R&L for transudative effusion. Post procedure improved respiratory function. --11/25-- hold Lasix --appears dry -- At home patient is on Aldactone and torsemide  3. Permanent atrial fibrillation - not a candidate for anticoagulation.  -Has had good rate control. -Follows with [facility]ology.          4. HTN - as outpatient was controlled on ARB -- hold BP meds since blood pressure is low/soft.  5. HLD - hold medication until patient stable and taking J-tube feeds.  6. Chronic stage IIIa -creatinine  1.5--1.9 -avoid nephrotoxins  7. Severe malnutrition -Nutrition Status: Nutrition Problem: Severe Malnutrition Etiology: chronic illness (CHF, CKD) Signs/Symptoms: percent weight loss, severe fat depletion, severe muscle depletion Percent weight loss: 17.7 % Interventions: Refer to RD note for recommendations follow dietitian recommendations     Procedures: laparoscopic repair of duodenal ulcer, G-tube placement, open cholecystectomy. Family communication : wife in the room CODE STATUS: full DVT Prophylaxis :per surgery (primary team)  Status is: Inpatient       TOTAL TIME TAKING CARE OF THIS PATIENT: 20* minutes.  >50% time spent on counselling and coordination of care  Note: This dictation was prepared with Dragon dictation along with smaller phrase technology. Any transcriptional errors that result from this process are unintentional.  Thomas [name] M.D    [facility]   CC: Primary care physician; [name], MDPatient ID: Thomas [name], male   DOB: 1934-05-09, 85 y.o.   MRN: [id number]

## 2020-02-09 DIAGNOSIS — I25708 Atherosclerosis of coronary artery bypass graft(s), unspecified, with other forms of angina pectoris: Secondary | ICD-10-CM | POA: Diagnosis not present

## 2020-02-09 DIAGNOSIS — I10 Essential (primary) hypertension: Secondary | ICD-10-CM | POA: Diagnosis not present

## 2020-02-09 DIAGNOSIS — I5042 Chronic combined systolic (congestive) and diastolic (congestive) heart failure: Secondary | ICD-10-CM | POA: Diagnosis not present

## 2020-02-09 DIAGNOSIS — K275 Chronic or unspecified peptic ulcer, site unspecified, with perforation: Secondary | ICD-10-CM | POA: Diagnosis not present

## 2020-02-09 DIAGNOSIS — E43 Unspecified severe protein-calorie malnutrition: Secondary | ICD-10-CM

## 2020-02-09 LAB — BASIC METABOLIC PANEL
Anion gap: 10 (ref 5–15)
BUN: 42 mg/dL — ABNORMAL HIGH (ref 8–23)
CO2: 28 mmol/L (ref 22–32)
Calcium: 8.5 mg/dL — ABNORMAL LOW (ref 8.9–10.3)
Chloride: 105 mmol/L (ref 98–111)
Creatinine, Ser: 1.76 mg/dL — ABNORMAL HIGH (ref 0.61–1.24)
GFR, Estimated: 38 mL/min — ABNORMAL LOW (ref >60)
Glucose, Bld: 104 mg/dL — ABNORMAL HIGH (ref 70–99)
Potassium: 4.1 mmol/L (ref 3.5–5.1)
Sodium: 143 mmol/L (ref 135–145)

## 2020-02-09 LAB — MAGNESIUM: Magnesium: 2.4 mg/dL (ref 1.7–2.4)

## 2020-02-09 LAB — CBC
HCT: 26.4 % — ABNORMAL LOW (ref 39.0–52.0)
Hemoglobin: 7.9 g/dL — ABNORMAL LOW (ref 13.0–17.0)
MCH: 27.2 pg (ref 26.0–34.0)
MCHC: 29.9 g/dL — ABNORMAL LOW (ref 30.0–36.0)
MCV: 91 fL (ref 80.0–100.0)
Platelets: 230 10*3/uL (ref 150–400)
RBC: 2.9 MIL/uL — ABNORMAL LOW (ref 4.22–5.81)
RDW: 19.9 % — ABNORMAL HIGH (ref 11.5–15.5)
WBC: 6.9 10*3/uL (ref 4.0–10.5)
nRBC: 0 % (ref 0.0–0.2)

## 2020-02-09 LAB — GLUCOSE, CAPILLARY
Glucose-Capillary: 104 mg/dL — ABNORMAL HIGH (ref 70–99)
Glucose-Capillary: 99 mg/dL (ref 70–99)
Glucose-Capillary: 113 mg/dL — ABNORMAL HIGH (ref 70–99)

## 2020-02-09 LAB — SURGICAL PATHOLOGY

## 2020-02-09 MED ORDER — PIVOT 1.5 CAL PO LIQD
1000.0000 mL | ORAL | Status: DC
  Administered 2020-02-09 – 2020-02-11 (×3): 1000 mL
  Filled 2020-02-09: qty 1000

## 2020-02-09 NOTE — Progress Notes (Signed)
02/09/2020  Subjective: Patient is 3 Days Post-Op status post exploratory laparotomy, Thomas [name] patch repair of a large duodenal perforation, pyloric exclusion, gastrojejunostomy, and feeding jejunostomy tube placement on 01/23/2020.  No acute events overnight.  Patient reports that his abdominal discomfort is getting better.  Reports he is having some flatus but denies any bowel movement.  Has been tolerating tube feeds per J-tube at 20 mL/h.  WBC normalized today to 6.9.  There is a decrease in his hemoglobin to 7.9 but no overt signs of bleeding at this point.  Electrolytes are within normal, and his creatinine has been improving.  The patient is asking if he can have ice chips because his lips and mouth are very dry.  No thorough records of his intake and output over last 24 hours.  Vital signs: Temp:  [97.5 F (36.4 C)-98.5 F (36.9 C)] 98.5 F (36.9 C) (11/26 0925) Pulse Rate:  [69-73] 69 (11/26 0925) Resp:  [16-19] 16 (11/26 0925) BP: (98-108)/(59-64) 102/60 (11/26 0925) SpO2:  [91 %-95 %] 92 % (11/26 0925) Weight:  [80.1 kg] 80.1 kg (11/26 0452)   Intake/Output: 11/25 0701 - 11/26 0700 In: -  Out: 55 [Emesis/NG output:55] Last BM Date:  (PTA)  Physical Exam: Constitutional: No acute distress Abdomen: Soft, nondistended, appropriately tender to palpation.  Midline incision is clean, dry, intact with staples in place.  Left-sided jejunostomy tube in place connected to feeding pump.  Right-sided drain with serosanguineous fluid.  Labs:  Recent Labs    02/08/20 0532 02/09/20 0835  WBC 13.3* 6.9  HGB 9.2* 7.9*  HCT 29.8* 26.4*  PLT 302 230   Recent Labs    02/08/20 0532 02/09/20 0835  NA 142 143  K 4.9 4.1  CL 105 105  CO2 27 28  GLUCOSE 140* 104*  BUN 35* 42*  CREATININE 1.91* 1.76*  CALCIUM 8.6* 8.5*   Recent Labs    02/07/20 0637  LABPROT 18.1*  INR 1.6*    Imaging: No results found.  Assessment/Plan: This is a 85 y.o. male s/p exploratory laparotomy,  Graham patch repair of large duodenal perforated ulcer, pyloric exclusion, gastrojejunostomy, and J-tube placement.  -Discussed with dietitian this morning we will start advancing his feeding tubes as tolerated to goal.  He started to have some bowel function with some flatus.  We will watch closely to make sure there is no distention. -Continue n.p.o. with NG tube to suction.  Discussed with the patient the importance of keeping the NG tube for now.  I think it is okay to have a very limited amount of ice chips only to keep his lips and tongue wet but we want to avoid pushing any liquids down the stomach.  Discussed this with the patient, and he understands. -Continue IV antibiotics for his perforation. --Continue IV PPI twice daily. -Out of bed, will order PT   Melvyn Neth, [place]

## 2020-02-09 NOTE — Progress Notes (Addendum)
Nutrition Follow-up  DOCUMENTATION CODES:   Severe malnutrition in context of chronic illness  INTERVENTION:  Patient currently tolerating Pivot 1.5 Cal at trickle rate of 20 mL/hr per J-tube and plan is for advancement.  Advance by 20 mL/hr every 12 hours to goal regimen of Pivot 1.5 Cal at 60 mL/hr (1440 mL goal daily volume) via J-tube. Provides 2160 kcal, 135 grams of protein, 1080 mL H2O daily.  Provide minimum free water flush of 30 mL Q4hrs to maintain tube patency. Further changes to free water flush per MD.  Monitor magnesium, potassium, and phosphorus daily for at least 3 days, MD to replete as needed, as pt is at risk for refeeding syndrome given severe malnutrition. Surgery aware of risk for refeeding syndrome and will monitor  NUTRITION DIAGNOSIS:   Severe Malnutrition related to chronic illness (CHF, CKD) as evidenced by 17.7% weight loss over 5 months, severe fat depletion, severe muscle depletion.  Ongoing.  GOAL:   Patient will meet greater than or equal to 90% of their needs  Progressing with advancement of tube feeds.  MONITOR:   Labs, Weight trends, TF tolerance, Skin, I & O's  REASON FOR ASSESSMENT:   Consult Enteral/tube feeding initiation and management  ASSESSMENT:   85 year old male with PMHx of CAD s/p CABG 1986 and multiple PCIs, paroxysmal A-fib, s/p placement of biventricular ICD, CKD stage III, HTN, CHF, deafness requiring sign language interpreter admitted with perforated duodenal ulcer s/p exploratory laparotomy, duodenal ulcer repair with Phillip Heal patch, pyloric exclusion gastrojejunostomy and omega loop, feeding jejunostomy, and open cholecystectomy on 11/23.  Met with patient at bedside with use of [employer] tablet Oneita Kras [id number]). Patient reports he is tolerating tube feeds well. Discussed that plan is to start advancing tube feeds today.  Medications reviewed and include: free water 30 mL Q4hrs, gabapentin, Protonix,  Zosyn.  Labs reviewed: CBG 90-1225, BUN 42, Creatinine 1.76.  Enteral Access: NGT to LIS; J-tube (red rubber catheter) placed 11/23 during surgery  Discussed with surgeon. Plan for possible UGI on Monday (11/29) to evaluate anastomosis.  Diet Order:   Diet Order            Diet NPO time specified Except for: Ice Chips  Diet effective now                EDUCATION NEEDS:   No education needs have been identified at this time  Skin:  Skin Assessment: Skin Integrity Issues: Skin Integrity Issues:: Stage II, Incisions Stage II: buttocks (3cm x 1cm) Incisions: closed incision to abdomen with honeycomb dressing  Last BM:  PTA  Height:   Ht Readings from Last 1 Encounters:  01/15/2020 6' 2" (1.88 m)   Weight:   Wt Readings from Last 1 Encounters:  02/09/20 80.1 kg   Ideal Body Weight:  86.4 kg  BMI:  Body mass index is 22.67 kg/m.  Estimated Nutritional Needs:   Kcal:  2200-2400  Protein:  120-135 grams  Fluid:  2 L/day  Jacklynn Barnacle, MS, RD, LDN Pager number available on Am[employer]

## 2020-02-09 NOTE — Progress Notes (Signed)
+ [place] [place] NAME: Thomas [name]    MR#:  [id number]  DATE OF BIRTH:  1934/04/27  SUBJECTIVE:  last sign language interpreter. No family in the room   Patient is postop day 3 perforated ulcer repair.  Hemodynamically stable.  Tolerating tube feeding.  No new issues per RN  REVIEW OF SYSTEMS:   Review of Systems  Unable to perform ROS: Language  Constitutional: Negative for chills, fever and weight loss.  HENT: Negative for ear discharge, ear pain and nosebleeds.   Eyes: Negative for blurred vision, pain and discharge.  Respiratory: Negative for sputum production, shortness of breath, wheezing and stridor.   Cardiovascular: Negative for chest pain, palpitations, orthopnea and PND.  Gastrointestinal: Negative for abdominal pain, diarrhea, nausea and vomiting.  Genitourinary: Negative for frequency and urgency.  Musculoskeletal: Negative for back pain and joint pain.  Neurological: Positive for weakness. Negative for sensory change, speech change and focal weakness.  Psychiatric/Behavioral: Negative for depression and hallucinations. The patient is not nervous/anxious.    Tolerating Diet:TF to be started Tolerating PT: pending given acuity of illness  DRUG ALLERGIES:   Allergies  Allergen Reactions  . Entresto [Sacubitril-Valsartan] Swelling    And bruising of arm  . Phenazopyridine Nausea Only and Other (See Comments)    GI UPSET  . Ramipril Other (See Comments)    unk Other reaction(s): Other (See Comments), Unknown unk    VITALS:  Blood pressure 102/60, pulse 69, temperature 98.5 F (36.9 C), resp. rate 16, height 6\' 2"  (1.88 m), weight 80.1 kg, SpO2 92 %.  PHYSICAL EXAMINATION:   Physical Exam  GENERAL:  85 y.o.-year-old patient lying in the bed with no acute distress.appear weak and deconditioned  Appears chronically ill, thin, cachectic, malnourished HEENT: Head atraumatic, normocephalic. Oropharynx and  nasopharynx clear. Dry oral mucosa LUNGS: Normal breath sounds bilaterally, no wheezing, rales, rhonchi. No use of accessory muscles of respiration.  CARDIOVASCULAR: S1, S2 normal. No murmurs, rubs, or gallops.  ABDOMEN: Soft, nontender, nondistended. Bowel sounds present. No organomegaly or mass. G-tube, Foley+, post surgical drains+ EXTREMITIES: No cyanosis, clubbing or edema b/l.    NEUROLOGIC: grossly nonfocal. Very deconditioned. Moves all extremities well. PSYCHIATRIC:  patient is alert and awake SKIN:  Pressure Injury 06/21/19 Buttocks Mid Stage 2 -  Partial thickness loss of dermis presenting as a shallow open injury with a red, pink wound bed without slough. (Active)  06/21/19 0700  Location: Buttocks  Location Orientation: Mid  Staging: Stage 2 -  Partial thickness loss of dermis presenting as a shallow open injury with a red, pink wound bed without slough.  Wound Description (Comments):   Present on Admission:    LABORATORY PANEL:  CBC Recent Labs  Lab 02/09/20 0835  WBC 6.9  HGB 7.9*  HCT 26.4*  PLT 230    Chemistries  Recent Labs  Lab 02/08/20 0532 02/08/20 0532 02/09/20 0835  NA 142   < > 143  K 4.9   < > 4.1  CL 105   < > 105  CO2 27   < > 28  GLUCOSE 140*   < > 104*  BUN 35*   < > 42*  CREATININE 1.91*   < > 1.76*  CALCIUM 8.6*   < > 8.5*  MG 2.3   < > 2.4  AST 28  --   --   ALT 17  --   --   ALKPHOS 85  --   --  BILITOT 1.5*  --   --    < > = values in this interval not displayed.   Cardiac Enzymes No results for input(s): TROPONINI in the last 168 hours. RADIOLOGY:  No results found. ASSESSMENT AND PLAN:   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 Patient came in with acute abdominal pain leading to presentation to [facility] ED where  evaluation revealed peritonitis and intraabdominal free air.  1.Perforated duodenal ulcer - patient present with acute perforation and peritonitis requiring emergency surgery. Complex surgery (see op-note) with Patch to duodenal perforation, pyloric diversion with creation of gastrojejunostomy and placement of jejunostomy feeding tube, cholecystectomy. -cont  with continue Zosyn coverage   -Pain mgt per surgery   - continuation IV PPI -Post-op day 2  2. chronic systolic congestive heart failure with history of moderate to severe tricuspid regurgitation, moderate mitral valve regurgitation.  -Patient appears euvolemic -recently underwent thoracentesis  R&L for transudative effusion. Post procedure improved respiratory function. --11/25-- hold Lasix --appears dry -- At home patient is on Aldactone and torsemide  3. Permanent atrial fibrillation - not a candidate for anticoagulation.  -Has had good rate control. -Follows with [facility] cardiology.          4. HTN - as outpatient was controlled on ARB -- hold BP meds since blood pressure is low/soft.  5. HLD - hold medication until patient stable and taking J-tube feeds.  6. Chronic stage IIIa -creatinine  1.5--1.9 -avoid nephrotoxins  7. Severe malnutrition -Nutrition Status: Nutrition Problem: Severe Malnutrition Etiology: chronic illness (CHF, CKD) Signs/Symptoms: percent weight loss, severe fat depletion, severe muscle depletion Percent weight loss: 17.7 % Interventions: Refer to RD note for recommendations follow dietitian recommendations  8.  Pressure Injury 06/21/19 Buttocks Mid Stage 2 -  Partial thickness loss of dermis presenting as a shallow open injury with a red, pink wound bed without slough. (Active)  06/21/19 0700  Location: Buttocks  Location Orientation: Mid  Staging: Stage 2 -  Partial thickness loss of dermis presenting as a shallow open injury with a red, pink wound bed without slough.  Wound Description  (Comments):   Present on Admission:      Procedures: laparoscopic repair of duodenal ulcer, G-tube placement, open cholecystectomy. Family communication :none today CODE STATUS: full DVT Prophylaxis :per surgery (primary team)  Status is: Inpatient  Per Rn--foley to be removed by primary team tomorrow     TOTAL TIME TAKING CARE OF THIS PATIENT: 20* minutes.  >50% time spent on counselling and coordination of care  Note: This dictation was prepared with Dragon dictation along with smaller phrase technology. Any transcriptional errors that result from this process are unintentional.  Fritzi Mandes M.D    [facility]   CC: Primary care physician; [name], MDPatient ID: Thomas [name], male   DOB: 02/10/1935, 85 y.o.   MRN: [id number]

## 2020-02-10 DIAGNOSIS — I4821 Permanent atrial fibrillation: Secondary | ICD-10-CM | POA: Diagnosis not present

## 2020-02-10 DIAGNOSIS — I25708 Atherosclerosis of coronary artery bypass graft(s), unspecified, with other forms of angina pectoris: Secondary | ICD-10-CM | POA: Diagnosis not present

## 2020-02-10 DIAGNOSIS — K265 Chronic or unspecified duodenal ulcer with perforation: Secondary | ICD-10-CM | POA: Diagnosis not present

## 2020-02-10 DIAGNOSIS — E43 Unspecified severe protein-calorie malnutrition: Secondary | ICD-10-CM | POA: Diagnosis not present

## 2020-02-10 LAB — BASIC METABOLIC PANEL
Anion gap: 10 (ref 5–15)
BUN: 43 mg/dL — ABNORMAL HIGH (ref 8–23)
CO2: 28 mmol/L (ref 22–32)
Calcium: 8.1 mg/dL — ABNORMAL LOW (ref 8.9–10.3)
Chloride: 110 mmol/L (ref 98–111)
Creatinine, Ser: 1.44 mg/dL — ABNORMAL HIGH (ref 0.61–1.24)
GFR, Estimated: 48 mL/min — ABNORMAL LOW (ref >60)
Glucose, Bld: 115 mg/dL — ABNORMAL HIGH (ref 70–99)
Potassium: 3.8 mmol/L (ref 3.5–5.1)
Sodium: 148 mmol/L — ABNORMAL HIGH (ref 135–145)

## 2020-02-10 LAB — CBC
HCT: 26.6 % — ABNORMAL LOW (ref 39.0–52.0)
Hemoglobin: 8.2 g/dL — ABNORMAL LOW (ref 13.0–17.0)
MCH: 27.7 pg (ref 26.0–34.0)
MCHC: 30.8 g/dL (ref 30.0–36.0)
MCV: 89.9 fL (ref 80.0–100.0)
Platelets: 244 10*3/uL (ref 150–400)
RBC: 2.96 MIL/uL — ABNORMAL LOW (ref 4.22–5.81)
RDW: 20 % — ABNORMAL HIGH (ref 11.5–15.5)
WBC: 4.6 10*3/uL (ref 4.0–10.5)
nRBC: 0 % (ref 0.0–0.2)

## 2020-02-10 LAB — PHOSPHORUS: Phosphorus: 2.7 mg/dL (ref 2.5–4.6)

## 2020-02-10 LAB — GLUCOSE, CAPILLARY
Glucose-Capillary: 107 mg/dL — ABNORMAL HIGH (ref 70–99)
Glucose-Capillary: 116 mg/dL — ABNORMAL HIGH (ref 70–99)
Glucose-Capillary: 104 mg/dL — ABNORMAL HIGH (ref 70–99)
Glucose-Capillary: 103 mg/dL — ABNORMAL HIGH (ref 70–99)
Glucose-Capillary: 117 mg/dL — ABNORMAL HIGH (ref 70–99)

## 2020-02-10 LAB — MAGNESIUM: Magnesium: 2.6 mg/dL — ABNORMAL HIGH (ref 1.7–2.4)

## 2020-02-10 NOTE — Evaluation (Signed)
Physical Therapy Evaluation Patient Details Name: Thomas [name] MRN: [id number] DOB: [birth year] Today's Date: 02/10/2020   History of Present Illness  Pt presents this admission s/p ex lap, Graham patch repair of large duodenal perforated ulcer, pyloric exclusion, gastrojejunostomy, and J-tube placement. He was just recently admitted 2/2 fall. The pt is hearing impaired and uses ASL. He has PMHx significant for high-grade AVB s/p BiV-ICD, sCHF, A-fib, lymphedema, PUD, and PrCA.  Clinical Impression  The pt presents with increased willingness to trial standing with PT this session. He requires consistent education for safety d/t impulsive nature. The pt requires increased assistance with bed mobility and for sit<>stand transfer. On previous admission x 1 week ago the pt scored 21/24 on the [facility] indicating 29.52% impairment. During this evaluation the pt scores 11/24 on the [facility] indicating 66.76% impairment. He presents with generalized weakness and balance impairments. At this time the pt would best benefit from short term rehabilitation in order to optimize functional mobility.     Follow Up Recommendations SNF    Equipment Recommendations  None recommended by PT    Recommendations for Other Services       Precautions / Restrictions Precautions Precautions: Fall Restrictions Weight Bearing Restrictions: No      Mobility  Bed Mobility Overal bed mobility: Needs Assistance Bed Mobility: Supine to Sit;Rolling Rolling: Min guard (with use of bed railing.)   Supine to sit: Mod assist     General bed mobility comments: Pt requiring assistance for LE management. (Pt performs lateral scoots at EOB with close supervision for line management.)    Transfers Overall transfer level: Needs assistance Equipment used: Straight cane Transfers: Sit to/from Stand Sit to Stand: From elevated surface;Max assist (Would be most appropriate for use of RW, however pt refusing this  session.)         General transfer comment: Pt refusing assitance initially. He also does not follow commands to hold therapist for assist with sit<>stand transfer, reaching outside of BOS for counter in order to attempt to stand. With Max A able to clear buttocks from elevated surface.  Ambulation/Gait Ambulation/Gait assistance:  (Unable to safely attempt)              Stairs            Wheelchair Mobility    Modified Rankin (Stroke Patients Only)       Balance Overall balance assessment: Needs assistance Sitting-balance support: Feet supported;Bilateral upper extremity supported Sitting balance-Leahy Scale: Poor (Pt presenting with intermittent posterior LOB while sitting EOB.)   Postural control: Posterior lean                                   Pertinent Vitals/Pain Pain Assessment: Faces Faces Pain Scale: Hurts little more Pain Location: abdomen, incision site. Pain Descriptors / Indicators: Tender;Sore Pain Intervention(s): Premedicated before session;Monitored during session;Limited activity within patient's tolerance    Home Living Family/patient expects to be discharged to:: Skilled nursing facility Living Arrangements: Spouse/significant other Available Help at Discharge: Family;Available 24 hours/day Type of Home: House Home Access: Level entry     Home Layout: One level Home Equipment: Walker - 2 wheels;Cane - single point      Prior Function Level of Independence: Needs assistance   Gait / Transfers Assistance Needed: pt states he was able to ambulate, however does not indicate ambulation distances or level of independence.  ADL's / [employer]  Needed: Wife assists with ADLs.  Comments: Pt utilizes [facility] for ambulation.     Hand Dominance        Extremity/Trunk Assessment   Upper Extremity Assessment Upper Extremity Assessment: Generalized weakness    Lower Extremity Assessment Lower Extremity Assessment:  Generalized weakness    Cervical / Trunk Assessment Cervical / Trunk Assessment: Kyphotic  Communication   Communication: Deaf  Cognition Arousal/Alertness: Awake/alert Behavior During Therapy: Impulsive;Agitated Overall Cognitive Status: No family/caregiver present to determine baseline cognitive functioning                                 General Comments: used I pad interpreter throughout      General Comments      Exercises     Assessment/Plan    PT Assessment Patient needs continued PT services  PT Problem List Decreased strength;Decreased mobility;Decreased safety awareness;Decreased activity tolerance;Decreased knowledge of use of DME;Decreased balance       PT Treatment Interventions DME instruction;Therapeutic activities;Gait training;Therapeutic exercise;Balance training;Functional mobility training    PT Goals (Current goals can be found in the Care Plan section)  Acute Rehab PT Goals Patient Stated Goal: to stand PT Goal Formulation: With patient Time For Goal Achievement: 02/24/20 Potential to Achieve Goals: Fair    Frequency Min 2X/week   Barriers to discharge Decreased caregiver support      Co-evaluation               AM-PAC PT "6 Clicks" Mobility  Outcome Measure Help needed turning from your back to your side while in a flat bed without using bedrails?: A Little Help needed moving from lying on your back to sitting on the side of a flat bed without using bedrails?: A Lot Help needed moving to and from a bed to a chair (including a wheelchair)?: A Lot Help needed standing up from a chair using your arms (e.g., wheelchair or bedside chair)?: A Lot Help needed to walk in hospital room?: Total Help needed climbing 3-5 steps with a railing? : Total 6 Click Score: 11    End of Session Equipment Utilized During Treatment:  (d/t impulsive nature of patient and his unwillingness to accept assistance, unable to donn gait belt. HIGHLY  RECOMMEND.) Activity Tolerance: Patient limited by pain Patient left: in bed;with bed alarm set;with call bell/phone within reach Nurse Communication: Mobility status;Precautions PT Visit Diagnosis: Muscle weakness (generalized) (M62.81);Difficulty in walking, not elsewhere classified (R26.2);Unsteadiness on feet (R26.81)    Time: 1224-8250 PT Time Calculation (min) (ACUTE ONLY): 30 min   Charges:   PT Evaluation $PT Eval Moderate Complexity: 1 Mod PT Treatments $Therapeutic Activity: 8-22 mins        3:53 PM, 02/10/20 Thomas [name] PT, DPT Physical Therapist - [facility]  Thomas [name] 02/10/2020, 3:50 PM

## 2020-02-10 NOTE — Progress Notes (Signed)
PT Cancellation Note  Patient Details Name: Thomas [name] MRN: [id number] DOB: 01/17/1935   Cancelled Treatment:    Reason Eval/Treat Not Completed: Pain limiting ability to participate (Pain medication offered, pt still refusing; stating pain will be better in a few days.)   Montanna Mcbain A Lorianne Malbrough 02/10/2020, 11:39 AM

## 2020-02-10 NOTE — Progress Notes (Signed)
+ [place] at [place] NAME: Jasman Murri    MR#:  [id number]  DATE OF BIRTH:  June 22, 1934  SUBJECTIVE:  last sign language interpreter. No family in the room   Patient is postop day 4 perforated ulcer repair.  Hemodynamically stable.  Tolerating tube feeding.  Having BM's  REVIEW OF SYSTEMS:   Review of Systems  Unable to perform ROS: Language  Constitutional: Negative for chills, fever and weight loss.  HENT: Negative for ear discharge, ear pain and nosebleeds.   Eyes: Negative for blurred vision, pain and discharge.  Respiratory: Negative for sputum production, shortness of breath, wheezing and stridor.   Cardiovascular: Negative for chest pain, palpitations, orthopnea and PND.  Gastrointestinal: Negative for abdominal pain, diarrhea, nausea and vomiting.  Genitourinary: Negative for frequency and urgency.  Musculoskeletal: Negative for back pain and joint pain.  Neurological: Positive for weakness. Negative for sensory change, speech change and focal weakness.  Psychiatric/Behavioral: Negative for depression and hallucinations. The patient is not nervous/anxious.    Tolerating Diet:TF to be started Tolerating PT: pending given acuity of illness--to be startedsoon  DRUG ALLERGIES:   Allergies  Allergen Reactions   Entresto [Sacubitril-Valsartan] Swelling    And bruising of arm   Phenazopyridine Nausea Only and Other (See Comments)    GI UPSET   Ramipril Other (See Comments)    unk Other reaction(s): Other (See Comments), Unknown unk    VITALS:  Blood pressure 109/63, pulse 69, temperature 98.5 F (36.9 C), temperature source Oral, resp. rate 20, height 6\' 2"  (1.88 m), weight 80.1 kg, SpO2 94 %.  PHYSICAL EXAMINATION:   Physical Exam  GENERAL:  85 y.o.-year-old patient lying in the bed with no acute distress.appear weak and deconditioned  Appears chronically ill, thin, cachectic, malnourished HEENT: Head  atraumatic, normocephalic. Oropharynx and nasopharynx clear. Dry oral mucosa LUNGS: Normal breath sounds bilaterally, no wheezing, rales, rhonchi. No use of accessory muscles of respiration.  CARDIOVASCULAR: S1, S2 normal. No murmurs, rubs, or gallops.  ABDOMEN: Soft, nontender, nondistended. Bowel sounds present. No organomegaly or mass. G-tube, post surgical drains+ EXTREMITIES: No cyanosis, clubbing or edema b/l.    NEUROLOGIC: grossly nonfocal. Very deconditioned. Moves all extremities well. PSYCHIATRIC:  patient is alert and awake SKIN:  Pressure Injury 06/21/19 Buttocks Mid Stage 2 -  Partial thickness loss of dermis presenting as a shallow open injury with a red, pink wound bed without slough. (Active)  06/21/19 0700  Location: Buttocks  Location Orientation: Mid  Staging: Stage 2 -  Partial thickness loss of dermis presenting as a shallow open injury with a red, pink wound bed without slough.  Wound Description (Comments):   Present on Admission:    LABORATORY PANEL:  CBC Recent Labs  Lab 02/10/20 0503  WBC 4.6  HGB 8.2*  HCT 26.6*  PLT 244    Chemistries  Recent Labs  Lab 02/08/20 0532 02/09/20 0835 02/10/20 0503  NA 142   < > 148*  K 4.9   < > 3.8  CL 105   < > 110  CO2 27   < > 28  GLUCOSE 140*   < > 115*  BUN 35*   < > 43*  CREATININE 1.91*   < > 1.44*  CALCIUM 8.6*   < > 8.1*  MG 2.3   < > 2.6*  AST 28  --   --   ALT 17  --   --   ALKPHOS 85  --   --  BILITOT 1.5*  --   --    < > = values in this interval not displayed.   Cardiac Enzymes No results for input(s): TROPONINI in the last 168 hours. RADIOLOGY:  No results found. ASSESSMENT AND PLAN:   Mr. 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 Patient came in with acute abdominal pain  leading to presentation to [facility] ED where evaluation revealed peritonitis and intraabdominal free air.  1.Perforated duodenal ulcer - patient present with acute perforation and peritonitis requiring emergency surgery. Complex surgery (see op-note) with Patch to duodenal perforation, pyloric diversion with creation of gastrojejunostomy and placement of jejunostomy feeding tube, cholecystectomy. -cont  With IV Zosyn coverage   -Pain mgt per surgery  -IV PPI -Post-op day 4 -Upper GI on 11/29  2. chronic systolic congestive heart failure with history of moderate to severe tricuspid regurgitation, moderate mitral valve regurgitation.  -Patient appears euvolemic -recently underwent thoracentesis  R&L for transudative effusion. Post procedure improved respiratory function. --11/25-- hold Lasix --appears dry -- At home patient is on Aldactone and torsemide --11/27--good uop >2.3Liters  3. Permanent atrial fibrillation - not a candidate for anticoagulation.  -Has had good rate control. -Follows with [facility] cardiology. -not any rate blocking meds at home          4. HTN - as outpatient was controlled on ARB -- hold BP meds since blood pressure is low/soft.  5. HLD - hold medication until patient stable and taking J-tube feeds.  6. Chronic stage IIIa -creatinine  1.5--1.9 -avoid nephrotoxins  7. Severe malnutrition -Nutrition Status: Nutrition Problem: Severe Malnutrition Etiology: chronic illness (CHF, CKD) Signs/Symptoms: percent weight loss, severe fat depletion, severe muscle depletion Percent weight loss: 17.7 % Interventions: Refer to RD note for recommendations follow dietitian recommendations Tolerating TF  8.  Pressure Injury 06/21/19 Buttocks Mid Stage 2 -  Partial thickness loss of dermis presenting as a shallow open injury with a red, pink wound bed without slough. (Active)  06/21/19 0700  Location: Buttocks  Location Orientation: Mid  Staging: Stage 2 -  Partial  thickness loss of dermis presenting as a shallow open injury with a red, pink wound bed without slough.  Wound Description (Comments):   Present on Admission:    D/c foley today per surgery  Procedures: laparoscopic repair of duodenal ulcer, G-tube placement, open cholecystectomy. Family communication :per Surgery CODE STATUS: full DVT Prophylaxis :per surgery (primary team)  Status is: Inpatient       TOTAL TIME TAKING CARE OF THIS PATIENT: 20* minutes.  >50% time spent on counselling and coordination of care  Note: This dictation was prepared with Dragon dictation along with smaller phrase technology. Any transcriptional errors that result from this process are unintentional.  Fritzi Mandes M.D    [facility]   CC: Primary care physician; [name], MDPatient ID: [name], male   DOB: 1934-07-04, 85 y.o.   MRN: [id number]

## 2020-02-10 NOTE — Progress Notes (Signed)
02/10/2020  Subjective: Patient is 4 Days Post-Op status post exploratory laparotomy, Graham patch repair of a large duodenal perforated ulcer, pyloric exclusion, gastrojejunostomy, and feeding jejunostomy tube placement.  No acute events overnight.  Patient is having some flatus already and had reported bowel movement per nurse.  Reports that his pain is doing well and has been improving each day.  Denies any nausea.  Did well with some ice chips to keep his lips and tongue wet.  His tube feeds were advanced to goal and is tolerating this well.  Vital signs: Temp:  [97.6 F (36.4 C)-98.5 F (36.9 C)] 98.5 F (36.9 C) (11/27 0740) Pulse Rate:  [66-75] 69 (11/27 0740) Resp:  [16-20] 20 (11/27 0740) BP: (103-115)/(59-69) 109/63 (11/27 0740) SpO2:  [94 %-99 %] 94 % (11/27 0740)   Intake/Output: 11/26 0701 - 11/27 0700 In: 24  Out: 2640 [Urine:2350; Emesis/NG output:240; Drains:50] Last BM Date:  (PTA)  Physical Exam: Constitutional: No acute distress Abdomen: Soft, nondistended, appropriately tender to palpation.  Midline incision is clean, dry, intact with staples in place.  Patient has a right-sided JP drain serosanguineous output.  Left-sided feeding J-tube is connected to pump.  NG tube in place with bilious contents.  Labs:  Recent Labs    02/09/20 0835 02/10/20 0503  WBC 6.9 4.6  HGB 7.9* 8.2*  HCT 26.4* 26.6*  PLT 230 244   Recent Labs    02/09/20 0835 02/10/20 0503  NA 143 148*  K 4.1 3.8  CL 105 110  CO2 28 28  GLUCOSE 104* 115*  BUN 42* 43*  CREATININE 1.76* 1.44*  CALCIUM 8.5* 8.1*   No results for input(s): LABPROT, INR in the last 72 hours.  Imaging: No results found.  Assessment/Plan: This is a 85 y.o. 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  -Discussed with the patient that for now we will continue n.p.o. diet with NG tube in place to suction.  Continue feeding  through the J-tube at goal rate. -DC Foley catheter today. -Continue IV antibiotics and IV PPI. -Discussed with the patient again plans for upper GI series on Monday 11/29 to evaluate the gastrojejunostomy and the pyloric exclusion.   Melvyn Neth, [facility]

## 2020-02-11 DIAGNOSIS — I4821 Permanent atrial fibrillation: Secondary | ICD-10-CM | POA: Diagnosis not present

## 2020-02-11 DIAGNOSIS — I25708 Atherosclerosis of coronary artery bypass graft(s), unspecified, with other forms of angina pectoris: Secondary | ICD-10-CM | POA: Diagnosis not present

## 2020-02-11 DIAGNOSIS — E870 Hyperosmolality and hypernatremia: Secondary | ICD-10-CM

## 2020-02-11 DIAGNOSIS — K265 Chronic or unspecified duodenal ulcer with perforation: Secondary | ICD-10-CM | POA: Diagnosis not present

## 2020-02-11 DIAGNOSIS — E43 Unspecified severe protein-calorie malnutrition: Secondary | ICD-10-CM | POA: Diagnosis not present

## 2020-02-11 LAB — BPAM RBC
Blood Product Expiration Date: 202112172359
Blood Product Expiration Date: 202112222359
Blood Product Expiration Date: 202112302359
Blood Product Expiration Date: 202201012359
Blood Product Expiration Date: 202201012359
Blood Product Expiration Date: 202201022359
Unit Type and Rh: 5100
Unit Type and Rh: 5100
Unit Type and Rh: 5100
Unit Type and Rh: 5100
Unit Type and Rh: 5100
Unit Type and Rh: 5100

## 2020-02-11 LAB — TYPE AND SCREEN
ABO/RH(D): O POS
Antibody Screen: NEGATIVE
Unit division: 0
Unit division: 0
Unit division: 0
Unit division: 0
Unit division: 0
Unit division: 0

## 2020-02-11 LAB — MAGNESIUM: Magnesium: 2.7 mg/dL — ABNORMAL HIGH (ref 1.7–2.4)

## 2020-02-11 LAB — PHOSPHORUS: Phosphorus: 1.6 mg/dL — ABNORMAL LOW (ref 2.5–4.6)

## 2020-02-11 LAB — BASIC METABOLIC PANEL
Anion gap: 7 (ref 5–15)
BUN: 37 mg/dL — ABNORMAL HIGH (ref 8–23)
CO2: 29 mmol/L (ref 22–32)
Calcium: 8.4 mg/dL — ABNORMAL LOW (ref 8.9–10.3)
Chloride: 116 mmol/L — ABNORMAL HIGH (ref 98–111)
Creatinine, Ser: 1.17 mg/dL (ref 0.61–1.24)
GFR, Estimated: >60 mL/min (ref >60)
Glucose, Bld: 150 mg/dL — ABNORMAL HIGH (ref 70–99)
Potassium: 3.1 mmol/L — ABNORMAL LOW (ref 3.5–5.1)
Sodium: 152 mmol/L — ABNORMAL HIGH (ref 135–145)

## 2020-02-11 LAB — GLUCOSE, CAPILLARY
Glucose-Capillary: 104 mg/dL — ABNORMAL HIGH (ref 70–99)
Glucose-Capillary: 108 mg/dL — ABNORMAL HIGH (ref 70–99)
Glucose-Capillary: 120 mg/dL — ABNORMAL HIGH (ref 70–99)

## 2020-02-11 MED ORDER — OXYCODONE HCL 5 MG/5ML PO SOLN
5.0000 mg | Freq: Four times a day (QID) | ORAL | Status: DC | PRN
  Administered 2020-02-12 (×2): 5 mg
  Filled 2020-02-11 (×2): qty 5

## 2020-02-11 MED ORDER — POTASSIUM PHOSPHATES 15 MMOLE/5ML IV SOLN
20.0000 mmol | Freq: Once | INTRAVENOUS | Status: AC
  Administered 2020-02-11: 11:00:00 20 mmol via INTRAVENOUS
  Filled 2020-02-11: qty 6.67

## 2020-02-11 MED ORDER — FREE WATER
100.0000 mL | Status: DC
  Administered 2020-02-11 – 2020-02-12 (×5): 100 mL

## 2020-02-11 MED ORDER — SODIUM CHLORIDE 0.45 % IV SOLN: INTRAVENOUS | Status: DC

## 2020-02-11 MED ORDER — POTASSIUM CHLORIDE 20 MEQ/15ML (10%) PO SOLN
40.0000 meq | Freq: Once | ORAL | Status: AC
  Administered 2020-02-11: 11:00:00 40 meq via ORAL
  Filled 2020-02-11: qty 30

## 2020-02-11 NOTE — TOC Initial Note (Signed)
Transition of Care [facility]) - Initial/Assessment Note    Patient Details  Name: Thomas [name] MRN: [id number] Date of Birth: May 12, 1934  Transition of Care [facility]) CM/SW Contact:    Meriel Flavors, LCSW Phone Number: 02/11/2020, 11:59 AM  Clinical Narrative:                 CSW spoke with patient's wife, [name]. Patient lives with wife, she is able to provide transportation, assist with getting medications, and with visits to patient's PCP, Croitoru, [name] Gobble, MD. Patient also has adult children that active and provide support as needed. No further needs identified at this time        Patient Goals and CMS Choice        Expected Discharge Plan and Services                                                Prior Living Arrangements/Services                       Activities of Daily Living      Permission Sought/Granted                  Emotional Assessment              Admission diagnosis:  Perforated duodenal ulcer ([place]) [K26.5] Perforated ulcer ([place]) [K27.5] Perforated chronic duodenal ulcer ([place]) [K26.5] Patient Active Problem List   Diagnosis Date Noted  . Protein-calorie malnutrition, severe 02/07/2020  . Perforated ulcer ([place])   . Perforated duodenal ulcer ([place]) 01/28/2020  . Perforated chronic duodenal ulcer ([place]) 01/26/2020  . Respiratory failure with hypoxia ([place]) 01/26/2020  . Chronic diastolic CHF (congestive heart failure) ([place]) 01/15/2020  . Duodenal ulceration   . 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 Candidal intertrigo 08/24/2019  . Edema of left upper extremity 08/24/2019  . Pneumonia of left lower lobe due to group B Streptococcus ([place]) 08/24/2019  . Heart block AV complete ([place]) 08/23/2019  . Sepsis due to group B Streptococcus ([place]) 08/20/2019  . Acute blood loss anemia 08/20/2019  . HCAP (healthcare-associated pneumonia) 08/18/2019  . Severe sepsis with septic shock ([place]) 08/18/2019  . Acute renal  failure superimposed on stage 3a chronic kidney disease ([place]) 08/18/2019  . Black stool 08/18/2019  . Iron deficiency anemia 08/18/2019  . Group B streptococcal bacteriuria 06/18/2019  . Lactic acidosis 06/18/2019  . Pressure ulcer, stage 2 ([place]) 06/18/2019  . Bacteremia due to group B Streptococcus 06/17/2019  . SIRS (systemic inflammatory response syndrome) ([place]) 06/16/2019  . Leg swelling   . Decompensated hepatic cirrhosis ([place]) 05/22/2019  . Anasarca 05/19/2019  . CRI (chronic renal insufficiency), stage 3 (moderate) ([place]) 01/30/2019  . Iron deficiency anemia due to chronic blood loss 11/02/2018  . Coronary artery disease of bypass graft of native heart with stable angina pectoris ([place]) 11/02/2018  . Pure hypercholesterolemia 11/02/2018  . Biventricular automatic implantable cardioverter defibrillator in situ 11/02/2018  . GIB (gastrointestinal bleeding) 09/14/2018  . Hypotension 08/14/2018  . Chronic systolic CHF (congestive heart failure) ([place]) 08/12/2018  . Acute on chronic combined systolic and diastolic CHF (congestive heart failure) ([place])   . GI bleed 05/26/2018  . Occult GI bleeding 05/25/2018  . Normocytic anemia 05/25/2018  . Elevated troponin 05/25/2018  . Lymphedema 02/28/2018  . Demand ischemia ([place]) 07/15/2016  .  Weakness 07/15/2016  . Fatigue 07/15/2016  . Stroke (cerebrum) ([place]) 10/31/2015  . Bulbous urethral stricture 09/18/2015  . Bilateral deafness 08/19/2015  . Bilateral cataracts 08/19/2015  . Acid reflux 08/19/2015  . Mixed hyperlipidemia 08/19/2015  . Essential hypertension 08/19/2015  . Myocardial infarction ([place]) 08/19/2015  . Calculus of kidney 08/19/2015  . Pacemaker 08/19/2015  . Gastroduodenal ulcer 08/19/2015  . Dupuytren's contracture of foot 08/19/2015  . Malignant neoplasm of prostate ([place]) 08/19/2015  . Microhematuria 08/19/2015  . CAD S/P multiple PCIs 02/22/2015  . Status post coronary artery bypass grafting 02/22/2015  . Benign  essential HTN 04/02/2014  . Hematochezia 02/20/2014  . Tricuspid valve regurgitation, nonrheumatic 02/20/2014  . Mobitz type II atrioventricular block 12/12/2013  . Long term current use of anticoagulant 10/18/2013  . Cardiomyopathy, ischemic 11/11/2012  . Permanent atrial fibrillation ([place]) 11/11/2012  . Biventricular ICD in place 11/11/2012  . Chronic combined systolic and diastolic heart failure ([place]) 11/11/2012   PCP:  [name], MD Pharmacy:   [id number] - [place], [place] [address] [place] [address] Phone: [phone] Fax: [phone]     Social Determinants of Health (SDOH) Interventions    Readmission Risk Interventions Readmission Risk Prevention Plan 02/11/2020 08/21/2019 06/23/2019  Transportation Screening Complete Complete Complete  PCP or Specialist Appt within 3-5 Days - - Complete  HRI or [place] - - Complete  Social Work Consult for [place] Planning/Counseling - - Complete  Palliative Care Screening - - Not Applicable  Medication Review Press photographer) Complete Complete Complete  PCP or Specialist appointment within 3-5 days of discharge Complete Complete -  [place] or Home Care Consult Complete Complete -  Palliative Care Screening Not Applicable - -  [place] Not Applicable Complete -  Some recent data might be hidden

## 2020-02-11 NOTE — Progress Notes (Signed)
+ [place] at [place] NAME: Elzia Hott    MR#:  [id number]  DATE OF BIRTH:  Jan 30, 1935  SUBJECTIVE:  Pt pulled NG tube out last nite--does not want another be placed diarrhea  REVIEW OF SYSTEMS:   Review of Systems  Unable to perform ROS: Language  Constitutional: Negative for chills, fever and weight loss.  HENT: Negative for ear discharge, ear pain and nosebleeds.   Eyes: Negative for blurred vision, pain and discharge.  Respiratory: Negative for sputum production, shortness of breath, wheezing and stridor.   Cardiovascular: Negative for chest pain, palpitations, orthopnea and PND.  Gastrointestinal: Negative for abdominal pain, diarrhea, nausea and vomiting.  Genitourinary: Negative for frequency and urgency.  Musculoskeletal: Negative for back pain and joint pain.  Neurological: Positive for weakness. Negative for sensory change, speech change and focal weakness.  Psychiatric/Behavioral: Negative for depression and hallucinations. The patient is not nervous/anxious.    Tolerating Diet:TF  Tolerating PT: rec rehab  DRUG ALLERGIES:   Allergies  Allergen Reactions  . Entresto [Sacubitril-Valsartan] Swelling    And bruising of arm  . Phenazopyridine Nausea Only and Other (See Comments)    GI UPSET  . Ramipril Other (See Comments)    unk Other reaction(s): Other (See Comments), Unknown unk    VITALS:  Blood pressure 115/61, pulse 70, temperature 97.9 F (36.6 C), resp. rate 18, height 6\' 2"  (1.88 m), weight 74.5 kg, SpO2 96 %.  PHYSICAL EXAMINATION:   Physical Exam  GENERAL:  85 y.o.-year-old patient lying in the bed with no acute distress.appear weak and deconditioned  Appears chronically ill, thin, cachectic, malnourished HEENT: Head atraumatic, normocephalic. Oropharynx and nasopharynx clear. Dry oral mucosa LUNGS: Normal breath sounds bilaterally, no wheezing, rales, rhonchi. No use of accessory muscles of  respiration.  CARDIOVASCULAR: S1, S2 normal. No murmurs, rubs, or gallops.  ABDOMEN: Soft, nontender, nondistended. Bowel sounds present. No organomegaly or mass. G-tube, post surgical drains+ EXTREMITIES: No cyanosis, clubbing or edema b/l.    NEUROLOGIC: grossly nonfocal. Very deconditioned. Moves all extremities well. PSYCHIATRIC:  patient is alert and awake SKIN:  Pressure Injury 06/21/19 Buttocks Mid Stage 2 -  Partial thickness loss of dermis presenting as a shallow open injury with a red, pink wound bed without slough. (Active)  06/21/19 0700  Location: Buttocks  Location Orientation: Mid  Staging: Stage 2 -  Partial thickness loss of dermis presenting as a shallow open injury with a red, pink wound bed without slough.  Wound Description (Comments):   Present on Admission:    LABORATORY PANEL:  CBC Recent Labs  Lab 02/10/20 0503  WBC 4.6  HGB 8.2*  HCT 26.6*  PLT 244    Chemistries  Recent Labs  Lab 02/08/20 0532 02/09/20 0835 02/11/20 0628  NA 142   < > 152*  K 4.9   < > 3.1*  CL 105   < > 116*  CO2 27   < > 29  GLUCOSE 140*   < > 150*  BUN 35*   < > 37*  CREATININE 1.91*   < > 1.17  CALCIUM 8.6*   < > 8.4*  MG 2.3   < > 2.7*  AST 28  --   --   ALT 17  --   --   ALKPHOS 85  --   --   BILITOT 1.5*  --   --    < > = values in this interval not displayed.  Cardiac Enzymes No results for input(s): TROPONINI in the last 168 hours. RADIOLOGY:  No results found. ASSESSMENT AND PLAN:   Mr. 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 Patient came in with acute abdominal pain leading to presentation to [facility] ED where evaluation revealed peritonitis and intraabdominal free air.  Perforated duodenal ulcer - patient present with acute perforation and  peritonitis requiring emergency surgery. Complex surgery (see op-note) with Patch to duodenal perforation, pyloric diversion with creation of gastrojejunostomy and placement of jejunostomy feeding tube, cholecystectomy. -cont  With IV Zosyn coverage   -Pain mgt per surgery  -IV PPI -Post-op day 5 -Upper GI on 11/29 -pt pulled NG out  Hypernatremia/hypokalemia/Hypophatemia suspect due to GI losses --increased free water to 100 cc q4 hourly --IV 1/2 NS --received KCL   chronic systolic congestive heart failure with history of moderate to severe tricuspid regurgitation, moderate mitral valve regurgitation.  -Patient appears euvolemic -recently underwent thoracentesis  R&L for transudative effusion. Post procedure improved respiratory function. --11/25-- hold Lasix --appears dry -- At home patient is on Aldactone and torsemide --11/27--good uop >2.3Liters   Permanent atrial fibrillation - not a candidate for anticoagulation.  -Has had good rate control. -Follows with [facility] cardiology. -not any rate blocking meds at home           HTN - as outpatient was controlled on ARB -- hold BP meds since blood pressure is low/soft.   HLD - hold medication until patient stable and taking J-tube feeds.   ChronicCKD stage IIIa -creatinine  1.5--1.9 -avoid nephrotoxins  Severe malnutrition -Nutrition Status: Nutrition Problem: Severe Malnutrition Etiology: chronic illness (CHF, CKD) Signs/Symptoms: percent weight loss, severe fat depletion, severe muscle depletion Percent weight loss: 17.7 % Interventions: Refer to RD note for recommendations follow dietitian recommendations Tolerating TF  Pressure Injury 06/21/19 Buttocks Mid Stage 2 -  Partial thickness loss of dermis presenting as a shallow open injury with a red, pink wound bed without slough. (Active)  06/21/19 0700  Location: Buttocks  Location Orientation: Mid  Staging: Stage 2 -  Partial thickness loss of dermis presenting as a  shallow open injury with a red, pink wound bed without slough.  Wound Description (Comments):   Present on Admission:    D/c foley today per surgery  Procedures: laparoscopic repair of duodenal ulcer, G-tube placement, open cholecystectomy. Family communication :per Surgery CODE STATUS: full DVT Prophylaxis :per surgery (primary team)  Status is: Inpatient       TOTAL TIME TAKING CARE OF THIS PATIENT: 20* minutes.  >50% time spent on counselling and coordination of care  Note: This dictation was prepared with Dragon dictation along with smaller phrase technology. Any transcriptional errors that result from this process are unintentional.  Fritzi Mandes M.D    [facility]   CC: Primary care physician; [name], MDPatient ID: [name], male   DOB: [birth year], 85 y.o.   MRN: [id number]

## 2020-02-11 NOTE — Progress Notes (Signed)
02/11/2020  Subjective: Patient is 5 Days Post-Op s/p exploratory laparotomy, Phillip Heal patch repair of a large duodenal perforated ulcer, pyloric exclusion, gastrojejunostomy, and feeding jejunostomy tube placement.  Patient pulled his NG tube overnight before morning shift.  However, there's no documentation of this, and MD on call was never contacted about it.  Patient apparently refused to get it replaced because the tube was hurting him.  Otherwise, no issues.  Continues having bowel function.  Vital signs: Temp:  [97.4 F (36.3 C)-97.9 F (36.6 C)] 97.9 F (36.6 C) (11/28 1203) Pulse Rate:  [69-71] 70 (11/28 1203) Resp:  [14-18] 18 (11/28 1203) BP: (107-122)/(56-68) 115/61 (11/28 1203) SpO2:  [96 %-98 %] 96 % (11/28 1203) Weight:  [74.5 kg] 74.5 kg (11/28 0546)   Intake/Output: 11/27 0701 - 11/28 0700 In: 1480.8 [I.V.:1310.8; IV Piggyback:50] Out: 500 [Urine:275; Emesis/NG output:450; Drains:60] Last BM Date: 02/10/20  Physical Exam: Constitutional: No acute distress Abdomen:  Soft, non-distended, appropriately sore to palpation.  Midline incision is clean, dry, intact, with staples.  Honeycomb dressings removed.  JP drain with serosanguinous fluid.  Labs:  Recent Labs    02/09/20 0835 02/10/20 0503  WBC 6.9 4.6  HGB 7.9* 8.2*  HCT 26.4* 26.6*  PLT 230 244   Recent Labs    02/10/20 0503 02/11/20 0628  NA 148* 152*  K 3.8 3.1*  CL 110 116*  CO2 28 29  GLUCOSE 115* 150*  BUN 43* 37*  CREATININE 1.44* 1.17  CALCIUM 8.1* 8.4*   No results for input(s): LABPROT, INR in the last 72 hours.  Imaging: No results found.  Assessment/Plan: This is a 85 y.o. male s/p exploratory laparotomy, Graham patch repair of a large duodenal perforated ulcer, pyloric exclusion, gastrojejunostomy, and feeding jejunostomy tube placement.   -Discussed with Dr. Dahlia Byes regarding the patient's NG tube and given that he is having bowel function, his drain remains serosanguineous, and he is  tolerating tube feeds, we will forego placement of the NG tube today.  He still due for a upper GI series contrast study tomorrow to evaluate the pyloric exclusion and repairing gastrojejunostomy. -Continue IV antibiotics. -Discussed with pharmacy today regarding his electrolyte derangement with a sodium of 152, chloride 116, potassium 3.1.  This may be related to refeeding syndrome given that he has been malnourished prior to surgery but he is also not getting enough free water will make these adjustments today.  Follow-up labs tomorrow. -Continue PPI and DVT prophylaxis.   Melvyn Neth, [facility]

## 2020-02-11 NOTE — Progress Notes (Signed)
Interpreter used for all care this shift. Tube feeding tubing/rate changed per order. Madlyn Frankel, RN

## 2020-02-11 NOTE — Progress Notes (Signed)
Patient ID: Thomas [name], male   DOB: 11-30-1934, 85 y.o.   MRN: [id number] discussed with patient's wife in the room with two nurses via sign language interpreter wife is worried patient is very sleepy and not interacting in talking with her as before. Explained to wife that patient pulled out NG tube last night has had several diarrheal stools requiring changing of linens/bed appears dehydrated on IV fluids and could be exhausted because of that. As well signs are stable. Has just got on one oxycodone per RN today. Wife does not want IV morphine. Will discontinue it. Continue to monitor.

## 2020-02-12 ENCOUNTER — Inpatient Hospital Stay: Payer: PPO

## 2020-02-12 DIAGNOSIS — I4821 Permanent atrial fibrillation: Secondary | ICD-10-CM | POA: Diagnosis not present

## 2020-02-12 DIAGNOSIS — E870 Hyperosmolality and hypernatremia: Secondary | ICD-10-CM | POA: Diagnosis not present

## 2020-02-12 DIAGNOSIS — E43 Unspecified severe protein-calorie malnutrition: Secondary | ICD-10-CM | POA: Diagnosis not present

## 2020-02-12 DIAGNOSIS — K265 Chronic or unspecified duodenal ulcer with perforation: Secondary | ICD-10-CM | POA: Diagnosis not present

## 2020-02-12 LAB — PHOSPHORUS: Phosphorus: 1.7 mg/dL — ABNORMAL LOW (ref 2.5–4.6)

## 2020-02-12 LAB — GLUCOSE, CAPILLARY
Glucose-Capillary: 124 mg/dL — ABNORMAL HIGH (ref 70–99)
Glucose-Capillary: 104 mg/dL — ABNORMAL HIGH (ref 70–99)
Glucose-Capillary: 101 mg/dL — ABNORMAL HIGH (ref 70–99)
Glucose-Capillary: 79 mg/dL (ref 70–99)
Glucose-Capillary: 138 mg/dL — ABNORMAL HIGH (ref 70–99)
Glucose-Capillary: 136 mg/dL — ABNORMAL HIGH (ref 70–99)

## 2020-02-12 LAB — BASIC METABOLIC PANEL
Anion gap: 10 (ref 5–15)
BUN: 32 mg/dL — ABNORMAL HIGH (ref 8–23)
CO2: 28 mmol/L (ref 22–32)
Calcium: 8.2 mg/dL — ABNORMAL LOW (ref 8.9–10.3)
Chloride: 116 mmol/L — ABNORMAL HIGH (ref 98–111)
Creatinine, Ser: 1.09 mg/dL (ref 0.61–1.24)
GFR, Estimated: >60 mL/min (ref >60)
Glucose, Bld: 143 mg/dL — ABNORMAL HIGH (ref 70–99)
Potassium: 3.7 mmol/L (ref 3.5–5.1)
Sodium: 154 mmol/L — ABNORMAL HIGH (ref 135–145)

## 2020-02-12 LAB — MAGNESIUM: Magnesium: 2.7 mg/dL — ABNORMAL HIGH (ref 1.7–2.4)

## 2020-02-12 MED ORDER — FREE WATER
150.0000 mL | Status: DC
  Administered 2020-02-12 – 2020-02-13 (×7): 150 mL

## 2020-02-12 MED ORDER — IOHEXOL 300 MG/ML  SOLN
25.0000 mL | Freq: Once | INTRAMUSCULAR | Status: AC | PRN
  Administered 2020-02-12: 09:00:00 25 mL

## 2020-02-12 MED ORDER — DEXTROSE 5 % IV SOLN: INTRAVENOUS | Status: DC

## 2020-02-12 MED ORDER — PIVOT 1.5 CAL PO LIQD
1000.0000 mL | ORAL | Status: DC
  Administered 2020-02-12 – 2020-02-13 (×2): 1000 mL
  Filled 2020-02-12: qty 1000

## 2020-02-12 NOTE — Progress Notes (Signed)
[facility] Day(s): 6.   Post op day(s): 6 Days Post-Op.   Interval History:  Patient seen and examined no acute events or new complaints overnight.  RLQ drain with 310 ccs out; serosanguinous Continues to have bowel function Renal function remains normal, sCr - 1.09, BUN 32, UO - 200 ccs + unmeasured He continues to have some degree of electrolyte derangement with hypernatremia to 154, hyperchloremia to 116, hypermagnesemia to 2.7, and hypophosphatemia to 1.7  He did undergo UGI as planned this morning; however, contrast was only injected through jejunostomy tube and not orally to evaluate his pyloric exclusion and gastrojejunostomy anastomosis.    Vital signs in last 24 hours: [min-max] current  Temp:  [97.4 F (36.3 C)-97.9 F (36.6 C)] 97.8 F (36.6 C) (11/29 0300) Pulse Rate:  [69-73] 70 (11/29 0921) Resp:  [16-20] 20 (11/29 0921) BP: (109-121)/(61-65) 109/65 (11/29 0921) SpO2:  [96 %-100 %] 99 % (11/29 0921) Weight:  [74.1 kg] 74.1 kg (11/29 0500)     Height: 6\' 2"  (188 cm) Weight: 74.1 kg BMI (Calculated): 20.97   Intake/Output last 2 shifts:  11/28 0701 - 11/29 0700 In: 2006.8 [I.V.:572.2; NG/GT:1199; IV Piggyback:135.6] Out: 513 [Urine:201; Drains:310; Stool:2]   Physical Exam:  Constitutional: resting comfortably Respiratory: breathing non-labored at rest  Cardiovascular: regular rate and sinus rhythm  Gastrointestinal: Soft, does not appear to be overtly tender, non-distended, no rebound/guarding, JP in the RLQ with serosanguinous output, jejunostomy tube in the left mid-abdomen Integumentary: Laparoscopic and laparotomy incisions are CDI with staples, no drainage or erythema   Labs:  CBC Latest Ref Rng & Units 02/10/2020 02/09/2020 02/08/2020  WBC 4.0 - 10.5 K/uL 4.6 6.9 13.3(H)  Hemoglobin 13.0 - 17.0 g/dL 8.2(L) 7.9(L) 9.2(L)  Hematocrit 39 - 52 % 26.6(L) 26.4(L) 29.8(L)  Platelets 150 - 400 K/uL 244 230  302   CMP Latest Ref Rng & Units 02/12/2020 02/11/2020 02/10/2020  Glucose 70 - 99 mg/dL 143(H) 150(H) 115(H)  BUN 8 - 23 mg/dL 32(H) 37(H) 43(H)  Creatinine 0.61 - 1.24 mg/dL 1.09 1.17 1.44(H)  Sodium 135 - 145 mmol/L 154(H) 152(H) 148(H)  Potassium 3.5 - 5.1 mmol/L 3.7 3.1(L) 3.8  Chloride 98 - 111 mmol/L 116(H) 116(H) 110  CO2 22 - 32 mmol/L 28 29 28   Calcium 8.9 - 10.3 mg/dL 8.2(L) 8.4(L) 8.1(L)  Total Protein 6.5 - 8.1 g/dL - - -  Total Bilirubin 0.3 - 1.2 mg/dL - - -  Alkaline Phos 38 - 126 U/L - - -  AST 15 - 41 U/L - - -  ALT 0 - 44 U/L - - -     Imaging studies:   UGI (02/12/2020) personally reviewed which appears to have had contrast injected through jejunostomy tube rather than PO, and radiologist report reviewed below:  IMPRESSION: Jejunostomy tube injection with no evidence of extravasation or small bowel obstruction. Tube in adequate position and functioning normally.   Assessment/Plan:  85 y.o. male 6 Days Post-Op s/p exploratory laparotomy, duodenal ulcer repair with Phillip Heal patch, pyloric exclusion gastrojejunostomy and omega loop, feeding jejunostomy, and open cholecystectomy for perforated duodenal ulcer, complicated by a multitude pertinent comorbidities.   - Will plan on repeating UGI with PO contrast to evaluate the gastrojejunostomy repair and pyloric exclusion. I have discussed this in person with radiologist.     - Continue NPO for now  - Increase free water; continue tube feedings, monitor electrolytes daily   - Continue IV Zosyn             -  Continue IV PPI; 40 mg BID              - Monitor abdominal examination; on-going bowel function             - Pain control prn; antiemetics prn             - Maintain surgical drain; mnitor and record output             - Monitor renal function; morning labs             - Appreciate medicine assistance with comorbid conditions conditions              - Mobilize once feasible   All of the above findings and  recommendations were discussed with the patient, and the medical team, and all of patient's questions were answered to his expressed satisfaction.  -- Edison Simon, PA-C [facility] 02/12/2020, 10:00 AM [phone] M-F: 7am - 4pm

## 2020-02-12 NOTE — Progress Notes (Addendum)
Progress Note I spent about 30 minutes at bedside with the help of virtual ASL interpretor discussing the patient's current condition, prognosis, and limitations to further care as outlined by Dr Dahlia Byes in his note. I believe there is some disconnect given the language barrier, and I am not sure his wife fully understands the severity of his condition. I attempted multiple times to explain this to her with the help of the interpretor however she seems mostly focused on getting him "swabs for his mouth." I explained to her our rationale for involving palliative care and what their role is in cases like his and helping families make difficult decisions. She seemed open and agreeable to this. She would like her son to be involved as well in this discussion. I think this is more than reasonable. She also requested an in-person interpretor. I will try and figure out how we can do this but she understands I can not make any promises regarding getting an in-person interpretor. Additionally, I attempted to review code status, intubation, CPR, etc..Marland KitchenMarland KitchenShe did not want to discuss this without reviewing with his cardiologist first. She seems to understands that there is a high-probablity he will never make it out of the hospital, but asks continually about plans for rehab and when he will be ready. At this point, I felt the discussion was becoming futile, and I ensured all questions were answered.   Plan:  -- We will proceed with attempting to establish a group palliative care meeting to establish code status, GOCs, end of life decisions. I will do my best once we have a time scheduled to get an in person interpretor. I have placed a consult to palliative care. -- In the meantime, he most remain NPO. Continue tube feedings + free water, monitor electrolytes -- Maintain JP drain, monitor and record output closely -- May need to consider re-imaging pending clinical condition -- Unfortunately, he is not a candidate for  surgical re-intervention given his limited options short of a Whipple, fragility, and acuity of current condition  -- Edison Simon, PA-C [facility] 02/12/2020, 3:15 PM [phone] M-F: 7am - 4pm

## 2020-02-12 NOTE — Progress Notes (Signed)
No ICM remote transmission received for 02/12/2020 due to hospitalization and next ICM transmission scheduled for 02/28/2020.

## 2020-02-12 NOTE — Progress Notes (Addendum)
+ [place] [facility] [place] NAME: Thomas [name]    MR#:  [id number]  DATE OF BIRTH:  [birth year]  SUBJECTIVE:  Via sign language interpreter patient is awake and alert. He is an lot of pain and quite frustrated with his nausea and pain he is upset and tells me "let me go" underwent upper G.I. series.   REVIEW OF SYSTEMS:   Review of Systems  Unable to perform ROS: Language  Constitutional: Negative for chills, fever and weight loss.  HENT: Negative for ear discharge, ear pain and nosebleeds.   Eyes: Negative for blurred vision, pain and discharge.  Respiratory: Negative for sputum production, shortness of breath, wheezing and stridor.   Cardiovascular: Negative for chest pain, palpitations, orthopnea and PND.  Gastrointestinal: Positive for abdominal pain and nausea. Negative for diarrhea and vomiting.  Genitourinary: Negative for frequency and urgency.  Musculoskeletal: Negative for back pain and joint pain.  Neurological: Positive for weakness. Negative for sensory change, speech change and focal weakness.  Psychiatric/Behavioral: Negative for depression and hallucinations. The patient is nervous/anxious.    Tolerating Diet:TF  Tolerating PT: rec rehab  DRUG ALLERGIES:   Allergies  Allergen Reactions  . Entresto [Sacubitril-Valsartan] Swelling    And bruising of arm  . Phenazopyridine Nausea Only and Other (See Comments)    GI UPSET  . Ramipril Other (See Comments)    unk Other reaction(s): Other (See Comments), Unknown unk    VITALS:  Blood pressure 114/63, pulse 69, temperature 98.3 F (36.8 C), resp. rate 19, height 6\' 2"  (1.88 m), weight 74.1 kg, SpO2 97 %.  PHYSICAL EXAMINATION:   Physical Exam  GENERAL:  85 y.o.-year-old patient lying in the bed with no acute distress.appear weak and deconditioned  Appears chronically ill, thin, cachectic, malnourished HEENT: Head atraumatic, normocephalic. Oropharynx and nasopharynx  clear. Dry oral mucosa. Poor dentition LUNGS: Normal breath sounds bilaterally, no wheezing, rales, rhonchi. No use of accessory muscles of respiration.  CARDIOVASCULAR: S1, S2 normal. No murmurs, rubs, or gallops.  ABDOMEN: Soft, nontender, nondistended. Bowel sounds present. No organomegaly or mass. G-tube, post surgical drains+ EXTREMITIES: No cyanosis, clubbing or edema b/l.    NEUROLOGIC: grossly nonfocal. Very deconditioned. Moves all extremities well. PSYCHIATRIC:  patient is alert and awake SKIN:  Pressure Injury 06/21/19 Buttocks Mid Stage 2 -  Partial thickness loss of dermis presenting as a shallow open injury with a red, pink wound bed without slough. (Active)  06/21/19 0700  Location: Buttocks  Location Orientation: Mid  Staging: Stage 2 -  Partial thickness loss of dermis presenting as a shallow open injury with a red, pink wound bed without slough.  Wound Description (Comments):   Present on Admission:    LABORATORY PANEL:  CBC Recent Labs  Lab 02/10/20 0503  WBC 4.6  HGB 8.2*  HCT 26.6*  PLT 244    Chemistries  Recent Labs  Lab 02/08/20 0532 02/09/20 0835 02/12/20 0715  NA 142   < > 154*  K 4.9   < > 3.7  CL 105   < > 116*  CO2 27   < > 28  GLUCOSE 140*   < > 143*  BUN 35*   < > 32*  CREATININE 1.91*   < > 1.09  CALCIUM 8.6*   < > 8.2*  MG 2.3   < > 2.7*  AST 28  --   --   ALT 17  --   --   ALKPHOS  85  --   --   BILITOT 1.5*  --   --    < > = values in this interval not displayed.   Cardiac Enzymes No results for input(s): TROPONINI in the last 168 hours. RADIOLOGY:  DG UGI W SINGLE CM (SOL OR THIN BA)  Addendum Date: 02/12/2020   ADDENDUM REPORT: 02/12/2020 12:17 ADDENDUM: Patient was brought back for re-evaluation via water-soluble oral contrast. Patient unable to sip via straw as sips via cup were administered. Exam somewhat difficult due to patient immobility. Visualized esophagus is normal. A moderate amount of contrast enters the body of the  stomach over the midline and left of midline. Patient was placed into the supine as well as steep left and right oblique positions and table elevated 45 degrees. Patient was imaged for 10 minutes without contrast seen leaving the stomach in antegrade fashion through the gastrojejunostomy. Small amount of gastroesophageal reflux was noted. Procedure was terminated at this point with plan for delayed KUB in 1 hour to further assess patency of the gastrojejunostomy anastomosis. Electronically Signed   By: Marin Olp M.D.   On: 02/12/2020 12:17   Result Date: 02/12/2020 CLINICAL DATA:  Post gastrojejunostomy. Recent laparotomy/cholecystectomy 01/27/2020. EXAM: WATER SOLUBLE UPPER GI SERIES TECHNIQUE: Single-column upper GI series was performed using water soluble contrast. CONTRAST:  50% dilute Omnipaque 300 COMPARISON:  CT 01/17/2020 FLUOROSCOPY TIME:  Fluoroscopy Time:  1 minutes 4 seconds Radiation Exposure Index (if provided by the fluoroscopic device): 30.9 mGy Number of Acquired Spot Images: 6 FINDINGS: Precontrast KUB demonstrates a surgical drain with tip over the stomach in the left upper quadrant. Single air-filled nondilated small bowel loops over the left upper quadrant. Embolization material over the right upper quadrant. Aortic stent graft in place. Patient's jejunostomy tube was injected as contrast flows freely into the jejunum without evidence of extravasation. Tip of tube is located over the left lower quadrant. Contrast moves freely through the small bowel without evidence of obstruction. No evidence of retrograde flow of contrast to the stomach. Remainder the exam is unremarkable. IMPRESSION: Jejunostomy tube injection with no evidence of extravasation or small bowel obstruction. Tube in adequate position and functioning normally. Electronically Signed: By: Marin Olp M.D. On: 02/12/2020 09:41   ASSESSMENT AND PLAN:   Mr. 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 Patient came in with acute abdominal pain leading to presentation to [facility] ED where evaluation revealed peritonitis and intraabdominal free air.  Perforated duodenal ulcer - patient present with acute perforation and peritonitis requiring emergency surgery. Complex surgery (see op-note) with Patch to duodenal perforation, pyloric diversion with creation of gastrojejunostomy and placement of jejunostomy feeding tube, cholecystectomy. -cont empiric IV Zosyn coverage   -Pain mgt per surgery  -IV PPI -Post-op day 6 -Upper GI on 11/29--was done via J tube showed no blockage. Needs another Upper GI with po contrast - pt is frustrated with his abdominal pain. His wife yday did not want pt to get IV pain meds due to him being a bit drowsy. I have relayed message to surgery to d/w wife and pt.  -primary team to consider Palliative care consult  Hypernatremia/hypokalemia/Hypophosphatemia suspect due to GI losses --increased free water to 150 cc q4 hourly --Change to IV d5W --received KCL--K is 3.7 -Na 143--148--1/2  AS--505--397--Q7H  Chronic systolic congestive heart failure with history of moderate to severe tricuspid regurgitation, moderate mitral valve regurgitation.  -Patient appears euvolemic -recently underwent thoracentesis  R&L for transudative effusion. Post procedure improved respiratory function. -- At home patient is on Aldactone and torsemide-- on HOLD --11/27--good uop >2.3Liters   Permanent atrial fibrillation - not a candidate for anticoagulation.  -Has had good rate control. -Follows with [facility] cardiology. -not any rate blocking meds at home           HTN - as outpatient was controlled on ARB -- hold BP meds since blood pressure is low/soft.   HLD - hold medication until patient stable and  taking J-tube feeds.   ChronicCKD stage IIIa -baseline creatinine  1.5--1.9 -creat today 1.0 -avoid nephrotoxins  Severe malnutrition -Nutrition Status: Nutrition Problem: Severe Malnutrition Etiology: chronic illness (CHF, CKD) Signs/Symptoms: percent weight loss, severe fat depletion, severe muscle depletion Percent weight loss: 17.7 % Interventions: Refer to RD note for recommendations follow dietitian recommendations Tolerating TF  Pressure Injury 06/21/19 Buttocks Mid Stage 2 -  Partial thickness loss of dermis presenting as a shallow open injury with a red, pink wound bed without slough. (Active)  06/21/19 0700  Location: Buttocks  Location Orientation: Mid  Staging: Stage 2 -  Partial thickness loss of dermis presenting as a shallow open injury with a red, pink wound bed without slough.  Wound Description (Comments):   Present on Admission:      Procedures: laparoscopic repair of duodenal ulcer, G-tube placement, open cholecystectomy. Family communication :per Surgery CODE STATUS: full DVT Prophylaxis :per surgery (primary team)  Status is: Inpatient  Overall poor prognosis     TOTAL TIME TAKING CARE OF THIS PATIENT: 20* minutes.  >50% time spent on counselling and coordination of care  Note: This dictation was prepared with Dragon dictation along with smaller phrase technology. Any transcriptional errors that result from this process are unintentional.  Fritzi Mandes M.D    [facility]   CC: Primary care physician; [name], MDPatient ID: [name], male   DOB: [birth year], 85 y.o.   MRN: [id number]

## 2020-02-12 NOTE — Progress Notes (Addendum)
Communication has been very difficult. I tried several times until I was able to talk to Thomas [name] ( son).  He is very understanding of the situationf.  He confirmed to me that he know his DAd is  actually at the end of his life and his dad did not want any heroic measures. He is also in agreement with me that he thinks that his dad   does not want  to be suffering.  He understands that there is so much we can do from our medical stand point. he also understands that it is challenging to communicate with his mother. Given an extensive conversation that I had with Thomas [name] and the pt's wife.  I do feel that the right thing to do is to make sure that he is DNR that he does not get intubated and that we can call for a palliative care meeting where things are explained even further to his wife.  I do not think that another intervention will be in his best interest and unfortunately his prognosis is not very good. We will make sure he is comfortable and continue current care I am granting the patient's wishes of not having heroic measures. He seems very tired and did not want to have major interventions from a medical standpoint. We reinstate DNR for now and wait on palliative care meeting.

## 2020-02-12 NOTE — Care Management Important Message (Signed)
Important Message  Patient Details  Name: Thomas [name] MRN: [id number] Date of Birth: Jun 19, 1934   Medicare Important Message Given:  N/A - LOS <3 / Initial given by admissions     [name] A [name] 02/12/2020, 8:06 AM

## 2020-02-13 ENCOUNTER — Telehealth: Payer: Self-pay | Admitting: Internal Medicine

## 2020-02-13 ENCOUNTER — Encounter: Payer: Self-pay | Admitting: Surgery

## 2020-02-13 ENCOUNTER — Inpatient Hospital Stay: Payer: PPO

## 2020-02-13 DIAGNOSIS — Z515 Encounter for palliative care: Secondary | ICD-10-CM

## 2020-02-13 DIAGNOSIS — E43 Unspecified severe protein-calorie malnutrition: Secondary | ICD-10-CM | POA: Diagnosis not present

## 2020-02-13 DIAGNOSIS — I4821 Permanent atrial fibrillation: Secondary | ICD-10-CM | POA: Diagnosis not present

## 2020-02-13 DIAGNOSIS — E870 Hyperosmolality and hypernatremia: Secondary | ICD-10-CM | POA: Diagnosis not present

## 2020-02-13 DIAGNOSIS — Z7189 Other specified counseling: Secondary | ICD-10-CM

## 2020-02-13 DIAGNOSIS — K659 Peritonitis, unspecified: Secondary | ICD-10-CM

## 2020-02-13 DIAGNOSIS — K265 Chronic or unspecified duodenal ulcer with perforation: Secondary | ICD-10-CM | POA: Diagnosis not present

## 2020-02-13 LAB — CBC
HCT: 34.3 % — ABNORMAL LOW (ref 39.0–52.0)
Hemoglobin: 10.3 g/dL — ABNORMAL LOW (ref 13.0–17.0)
MCH: 27.5 pg (ref 26.0–34.0)
MCHC: 30 g/dL (ref 30.0–36.0)
MCV: 91.7 fL (ref 80.0–100.0)
Platelets: 339 10*3/uL (ref 150–400)
RBC: 3.74 MIL/uL — ABNORMAL LOW (ref 4.22–5.81)
RDW: 19.9 % — ABNORMAL HIGH (ref 11.5–15.5)
WBC: 13 10*3/uL — ABNORMAL HIGH (ref 4.0–10.5)
nRBC: 1.1 % — ABNORMAL HIGH (ref 0.0–0.2)

## 2020-02-13 LAB — COMPREHENSIVE METABOLIC PANEL
ALT: 13 U/L (ref 0–44)
AST: 25 U/L (ref 15–41)
Albumin: 2 g/dL — ABNORMAL LOW (ref 3.5–5.0)
Alkaline Phosphatase: 118 U/L (ref 38–126)
Anion gap: 14 (ref 5–15)
BUN: 47 mg/dL — ABNORMAL HIGH (ref 8–23)
CO2: 23 mmol/L (ref 22–32)
Calcium: 7.9 mg/dL — ABNORMAL LOW (ref 8.9–10.3)
Chloride: 109 mmol/L (ref 98–111)
Creatinine, Ser: 1.91 mg/dL — ABNORMAL HIGH (ref 0.61–1.24)
GFR, Estimated: 34 mL/min — ABNORMAL LOW (ref >60)
Glucose, Bld: 149 mg/dL — ABNORMAL HIGH (ref 70–99)
Potassium: 4 mmol/L (ref 3.5–5.1)
Sodium: 146 mmol/L — ABNORMAL HIGH (ref 135–145)
Total Bilirubin: 1.4 mg/dL — ABNORMAL HIGH (ref 0.3–1.2)
Total Protein: 5.3 g/dL — ABNORMAL LOW (ref 6.5–8.1)

## 2020-02-13 LAB — GLUCOSE, CAPILLARY
Glucose-Capillary: 139 mg/dL — ABNORMAL HIGH (ref 70–99)
Glucose-Capillary: 148 mg/dL — ABNORMAL HIGH (ref 70–99)
Glucose-Capillary: 121 mg/dL — ABNORMAL HIGH (ref 70–99)
Glucose-Capillary: 115 mg/dL — ABNORMAL HIGH (ref 70–99)

## 2020-02-13 LAB — MAGNESIUM: Magnesium: 2.5 mg/dL — ABNORMAL HIGH (ref 1.7–2.4)

## 2020-02-13 LAB — PHOSPHORUS: Phosphorus: 3.5 mg/dL (ref 2.5–4.6)

## 2020-02-13 LAB — SODIUM: Sodium: 148 mmol/L — ABNORMAL HIGH (ref 135–145)

## 2020-02-13 MED ORDER — GLYCOPYRROLATE 1 MG PO TABS
1.0000 mg | ORAL_TABLET | ORAL | Status: DC | PRN
  Filled 2020-02-13: qty 1

## 2020-02-13 MED ORDER — LORAZEPAM 2 MG/ML IJ SOLN: 1.0000 mg | INTRAMUSCULAR | Status: DC | PRN

## 2020-02-13 MED ORDER — ONDANSETRON HCL 4 MG/2ML IJ SOLN: 4.0000 mg | Freq: Four times a day (QID) | INTRAMUSCULAR | Status: DC | PRN

## 2020-02-13 MED ORDER — LORAZEPAM 2 MG/ML PO CONC: 1.0000 mg | ORAL | Status: DC | PRN

## 2020-02-13 MED ORDER — ACETAMINOPHEN 650 MG RE SUPP: 650.0000 mg | Freq: Four times a day (QID) | RECTAL | Status: DC | PRN

## 2020-02-13 MED ORDER — POLYVINYL ALCOHOL 1.4 % OP SOLN
1.0000 [drp] | Freq: Four times a day (QID) | OPHTHALMIC | Status: DC | PRN
  Filled 2020-02-13: qty 15

## 2020-02-13 MED ORDER — HALOPERIDOL 0.5 MG PO TABS
0.5000 mg | ORAL_TABLET | ORAL | Status: DC | PRN
  Filled 2020-02-13: qty 1

## 2020-02-13 MED ORDER — IOHEXOL 300 MG/ML  SOLN
75.0000 mL | Freq: Once | INTRAMUSCULAR | Status: AC | PRN
  Administered 2020-02-13: 75 mL via INTRAVENOUS

## 2020-02-13 MED ORDER — GLYCOPYRROLATE 0.2 MG/ML IJ SOLN
0.2000 mg | INTRAMUSCULAR | Status: DC | PRN
  Administered 2020-02-13: 0.2 mg via INTRAVENOUS
  Filled 2020-02-13: qty 1

## 2020-02-13 MED ORDER — HALOPERIDOL LACTATE 2 MG/ML PO CONC
0.5000 mg | ORAL | Status: DC | PRN
  Filled 2020-02-13: qty 0.3

## 2020-02-13 MED ORDER — ONDANSETRON 4 MG PO TBDP
4.0000 mg | ORAL_TABLET | Freq: Four times a day (QID) | ORAL | Status: DC | PRN
  Filled 2020-02-13: qty 1

## 2020-02-13 MED ORDER — MORPHINE SULFATE (PF) 2 MG/ML IV SOLN
1.0000 mg | INTRAVENOUS | Status: DC | PRN
  Administered 2020-02-13 – 2020-02-16 (×5): 2 mg via INTRAVENOUS
  Filled 2020-02-13 (×5): qty 1

## 2020-02-13 MED ORDER — IOHEXOL 9 MG/ML PO SOLN: 500.0000 mL | ORAL | Status: AC

## 2020-02-13 MED ORDER — BIOTENE DRY MOUTH MT LIQD: 15.0000 mL | OROMUCOSAL | Status: DC | PRN

## 2020-02-13 MED ORDER — HALOPERIDOL LACTATE 5 MG/ML IJ SOLN: 0.5000 mg | INTRAMUSCULAR | Status: DC | PRN

## 2020-02-13 MED ORDER — GLYCOPYRROLATE 0.2 MG/ML IJ SOLN: 0.2000 mg | INTRAMUSCULAR | Status: DC | PRN

## 2020-02-13 MED ORDER — LORAZEPAM 1 MG PO TABS: 1.0000 mg | ORAL_TABLET | ORAL | Status: DC | PRN

## 2020-02-13 MED ORDER — SODIUM CHLORIDE 0.9% FLUSH
3.0000 mL | Freq: Two times a day (BID) | INTRAVENOUS | Status: DC
  Administered 2020-02-13 – 2020-02-16 (×6): 3 mL via INTRAVENOUS

## 2020-02-13 MED ORDER — ACETAMINOPHEN 325 MG PO TABS: 650.0000 mg | ORAL_TABLET | Freq: Four times a day (QID) | ORAL | Status: DC | PRN

## 2020-02-13 MED ORDER — SODIUM CHLORIDE 0.9% FLUSH: 3.0000 mL | INTRAVENOUS | Status: DC | PRN

## 2020-02-13 NOTE — Telephone Encounter (Signed)
Noted.  Pt is admitted for perforated duodenal ulcer.  No action needed by cardiology per review of hospital notes.

## 2020-02-13 NOTE — Progress Notes (Signed)
Progress Note I had an extensive discussion at bedside with the patient's wife and son with the help of ASL interpretor. I was later joined by Talbot Grumbling, NP (Palliative Care). I reviewed the patient's current condition and decline in the last 48 hours. I shared with them the CT Abdomen/Pelvis findings from earlier in the day which were concerning for further extravasation of contrast into the abdomen mostly likely from giant duodenal ulceration and failure of patch. At this point, given his poor functional status, beginning signs of end organ damage, and alteration in cognition there are no further options from a surgical perspective and unfortunately this is not a survivable injury. I did let them know that he is in the dying process. They were understanding of this and had an opportunity to ask questions. At this point, I feel that transitioning to comfort measures only was in his best interest, and family was agreeable. Palliative care was able to explain the details of this. The wife just asked that all family members be allowed to visit, which is reasonable, and they were educated on visitor policies in this scenario. All additional questions were addressed.   We will plan to transition patient to comfort measures only.   Discussed with all members of his medical team.   -- Edison Simon, PA-C [facility] 02/13/2020, 2:08 PM [phone] M-F: 7am - 4pm

## 2020-02-13 NOTE — Progress Notes (Signed)
Patient ID: Thomas [name], male   DOB: 01-04-1935, 85 y.o.   MRN: [id number] Per surgery and Palliative care pt is transitioned to comfort care. IM will sign off

## 2020-02-13 NOTE — Progress Notes (Signed)
Nutrition Brief Follow-Up Note  Chart reviewed. Patient now transitioning to comfort care.   No further nutrition interventions warranted at this time. Please re-consult RD as needed.   Edelmiro Innocent King, MS, RD, LDN Pager number available on Amion   

## 2020-02-13 NOTE — Progress Notes (Signed)
Records reviewed. I agree with Mr Thomas [name] [facility]. Now comfort care which is the right thing to do for this unfortunate gentleman.

## 2020-02-13 NOTE — Consult Note (Signed)
Consultation Note Date: 02/13/2020   Patient Name: Thomas [name]  DOB: 1934-06-09  MRN: [id number]  Age / Sex: 85 y.o., male  PCP: [name], MD Referring Physician: Jules Husbands, MD  Reason for Consultation: Establishing goals of care  HPI/Patient Profile:Thomas [name] is a 85 y.o. male here with abdominal pain.  History is limited as patient uses American sign language and refuses to converse with me due to pain.  He had written on a pad that the pain began today as severe, bilateral abdominal pain. Leaking diverticular ulcer noted. Operative management with continued leak.   Clinical Assessment and Goals of Care: Patient is resting in bed. His eyes and mouth are both partially open. His skin is ashen. Wife and one of their children are at bedside with surgery service having a discussion. They have discussed a shift to comfort care with the use of a tele interpretor. As a group we re-discuss his status and that he has a very poor prognosis of likely hours to days. Family requests to call family to come to bedside. Comfort care and visitation policy was reviewed with them. Son began calling family members as patient's wife continued to ask questions. All questions asked and answered with interpretor. MOST form was completed with wife and son. Wife wrote notes to me requesting particular personnel to come to bedside.     Brought ice water and swabs for patient at wife's request. He attempted to turn away upon attempts at mouth care, but was unable to close his mouth. Items for mouth care left at bedside. Comfort orders placed and reviewed with surgery PA Delena Bali.   I completed a MOST form today and the signed original was placed in the chart. A photocopy was placed in the chart to be scanned into EMR. The patient outlined their wishes for the following treatment decisions:  Cardiopulmonary Resuscitation:  Do Not Attempt Resuscitation (DNR/No CPR)  Medical Interventions: Comfort Measures: Keep clean, warm, and dry. Use medication by any route, positioning, wound care, and other measures to relieve pain and suffering. Use oxygen, suction and manual treatment of airway obstruction as needed for comfort. Do not transfer to the hospital unless comfort needs cannot be met in current location.  Antibiotics: No antibiotics (use other measures to relieve symptoms)  IV Fluids: No IV fluids (provide other measures to ensure comfort)  Feeding Tube: No feeding tube       SUMMARY OF RECOMMENDATIONS   Shifting to full comfort care.  Not stable to transport from hospital.   Prognosis:   Hours - Days  Discharge Planning: Anticipated Hospital Death      Primary Diagnoses: Present on Admission: . Perforated duodenal ulcer ([place]) . Permanent atrial fibrillation ([place]) . Benign essential HTN . Bilateral deafness . Cardiomyopathy, ischemic . Chronic combined systolic and diastolic heart failure ([place]) . Mixed hyperlipidemia . Coronary artery disease of bypass graft of native heart with stable angina pectoris ([place]) . CRI (chronic renal insufficiency), stage 3 (moderate) ([facility])   I have  reviewed the medical record, interviewed the patient and family, and examined the patient. The following aspects are pertinent.  Past Medical History:  Diagnosis Date  . AICD (automatic cardioverter/defibrillator) present   . Bacteremia due to group B Streptococcus    Recurrent admissions for group B Strepotococcus bacteremia of unknown source with TEEs negative for vegetation 06/2019 and 08/2019  . Biventricular ICD (implantable cardioverter-defibrillator) in place 03/24/2005   Implantation of a Medtronic Adapta ADDRO1, serial number [id number]  . CHF (congestive heart failure) ([place])   . CKD (chronic kidney disease), stage III ([place])   . Coronary artery disease    a. s/p CABG 1986. b. Multiple PCIs/caths. c. 09/2013: s/p  PTCA and BMS to SVG-OM.  [name]    Requires sign language interpreter  . Dysrhythmia   . History of abdominal aortic aneurysm   . History of bleeding peptic ulcer 1980  . History of epididymitis 2013  . HTN (hypertension)   . Hydronephrosis with ureteropelvic junction obstruction   . Hydroureter on left 2009  . Hypertension   . Ischemic cardiomyopathy    a. Prior EF 30-35%, s/p BIV-ICD. b. 09/2013: EF 45-50%.  . Moderate tricuspid regurgitation   . PAF (paroxysmal atrial fibrillation) (H[facility])    Not on [place] 2/2 GIB  . Presence of permanent cardiac pacemaker 2002   Original placed in 2002 for CHB then 2007 and 2014 device change out  . Prostate cancer ([place])   . Status post coronary artery bypass grafting 1986   LIMA to the LAD, SVG to OM, SVG to RCA  . Testicular swelling    Social History   Socioeconomic History  . Marital status: Married    Spouse name: Not on file  . Number of children: Not on file  . Years of education: Not on file  . Highest education level: Not on file  Occupational History  . Not on file  Tobacco Use  . Smoking status: Former Smoker    Quit date: 03/15/1985    Years since quitting: 34.9  . Smokeless tobacco: Never Used  Vaping Use  . Vaping Use: Never used  Substance and Sexual Activity  . Alcohol use: No    Comment: occas.  . Drug use: No  . Sexual activity: Yes  Other Topics Concern  . Not on file  Social History Narrative  . Not on file   Social Determinants of Health   Financial Resource Strain:   . Difficulty of Paying Living Expenses: Not on file  Food Insecurity:   . Worried About Charity fundraiser in the Last Year: Not on file  . Ran Out of Food in the Last Year: Not on file  Transportation Needs:   . Lack of Transportation (Medical): Not on file  . Lack of Transportation (Non-Medical): Not on file  Physical Activity:   . Days of Exercise per Week: Not on file  . Minutes of Exercise per Session: Not on file  Stress:   .  Feeling of Stress : Not on file  Social Connections:   . Frequency of Communication with Friends and Family: Not on file  . Frequency of Social Gatherings with Friends and Family: Not on file  . Attends Religious Services: Not on file  . Active Member of Clubs or Organizations: Not on file  . Attends Archivist Meetings: Not on file  . Marital Status: Not on file   Family History  Problem Relation Age of Onset  . Hypertension Father  Scheduled Meds: . acetaminophen (TYLENOL) oral liquid 160 mg/5 mL  1,000 mg Per Tube Q6H  . gabapentin  200 mg Per Tube Q8H  . sodium chloride flush  3 mL Intravenous Q12H   Continuous Infusions: PRN Meds:.antiseptic oral rinse, glycopyrrolate **OR** glycopyrrolate **OR** glycopyrrolate, haloperidol **OR** haloperidol **OR** haloperidol lactate, LORazepam **OR** LORazepam **OR** LORazepam, morphine injection, ondansetron **OR** ondansetron (ZOFRAN) IV, polyvinyl alcohol, sodium chloride flush Medications Prior to Admission:  Prior to Admission medications   Medication Sig Start Date End Date Taking? Authorizing Provider  acetaminophen (TYLENOL) 325 MG tablet Take 2 tablets (650 mg total) by mouth every 6 (six) hours as needed for mild pain or fever. 08/23/19  Yes Wieting, Richard, MD  albuterol (PROVENTIL) (2.5 MG/3ML) 0.083% nebulizer solution Inhale 3 mLs into the lungs every 4 (four) hours as needed for wheezing or shortness of breath. 08/23/19  Yes Wieting, Richard, MD  ferrous sulfate 325 (65 FE) MG tablet Take 325 mg by mouth daily with breakfast.   Yes [provider]  losartan (COZAAR) 25 MG tablet Take 25 mg by mouth daily.   Yes [provider]  midodrine (PROAMATINE) 5 MG tablet TAKE 1 TABLET (5 MG TOTAL) BY MOUTH 3 (THREE) TIMES DAILY WITH MEALS. 01/23/20  Yes [name], MD  Multiple Vitamin (MULTIVITAMIN WITH MINERALS) TABS tablet Take 1 tablet by mouth daily. 05/29/19  Yes Nicole Kindred A, DO  Nystatin  (GERHARDT'S BUTT CREAM) CREA Apply 1 application topically 2 (two) times daily. Patient taking differently: Apply 1 application topically as needed.  09/11/19  Yes Mercy Riding, MD  simvastatin (ZOCOR) 20 MG tablet TAKE 1 TABLET BY MOUTH EVERY DAY Patient taking differently: Take 20 mg by mouth daily at 6 PM.  04/07/19  Yes Kilroy, Doreene Burke, PA-C  spironolactone (ALDACTONE) 25 MG tablet Take 25 mg by mouth daily.   Yes [provider]  torsemide (DEMADEX) 20 MG tablet Take 2 tablets (40 mg total) by mouth 2 (two) times daily. 09/11/19  Yes Mercy Riding, MD   Allergies  Allergen Reactions  . Entresto [Sacubitril-Valsartan] Swelling    And bruising of arm  . Phenazopyridine Nausea Only and Other (See Comments)    GI UPSET  . Ramipril Other (See Comments)    unk Other reaction(s): Other (See Comments), Unknown unk   Review of Systems  Unable to perform ROS   Physical Exam Constitutional:      Comments: Eyes and mouth partially open.     Vital Signs: BP (!) 89/50 (BP Location: Right Arm)   Pulse 70   Temp 98.8 F (37.1 C)   Resp 20   Ht '6\' 2"'  (1.88 m)   Wt 75.6 kg   SpO2 95%   BMI 21.40 kg/m  Pain Scale: Faces POSS *See Group Information*: 1-Acceptable,Awake and alert Pain Score: 2    SpO2: SpO2: 95 % O2 Device:SpO2: 95 % O2 Flow Rate: .O2 Flow Rate (L/min): 2 L/min  IO: Intake/output summary:   Intake/Output Summary (Last 24 hours) at 02/13/2020 1428 Last data filed at 02/13/2020 0600 Gross per 24 hour  Intake 2980.87 ml  Output 280 ml  Net 2700.87 ml    LBM: Last BM Date: 02/13/20 Baseline Weight: Weight: 79 kg Most recent weight: Weight: 75.6 kg     Palliative Assessment/Data:     Time In: 1:10 Time Out: 2:20 Time Total: 70 min Greater than 50%  of this time was spent counseling and coordinating care related to the above  assessment and plan.  Signed by: Asencion Gowda, NP   Please contact Palliative Medicine Team phone at [phone] for  questions and concerns.  For individual provider: See Shea Evans

## 2020-02-13 NOTE — Telephone Encounter (Signed)
I will send to both EP doctor's nurse Clent Ridges RN for Dr. [name], and to [facility] triage for primary card Dr. Sallyanne Kuster.

## 2020-02-13 NOTE — Progress Notes (Signed)
[facility] Day(s): 7.   Post op day(s): 7 Days Post-Op.   Interval History:  Patient seen and examined no acute events or new complaints overnight.  Worsening leukocytosis this morning to 13.0K Hypernatremia improved this morning to 146 and improved hypermagnesemia to 2.5, previous electrolyte derangements resolved Renal function worse this morning with sCr - 1.91, UO not recorded He is pending follow CT Abdomen/Pelvis as well this morning Surgical drain in RUQ with 380 ccs out; this has become bilious in the last 24 hours I have attempted to involve palliative care in this difficult case. Discussion with the family have been challenging. His son did agree with DNR last night. We will await family meeting sometime soon.    Vital signs in last 24 hours: [min-max] current  Temp:  [97.5 F (36.4 C)-98.5 F (36.9 C)] 97.6 F (36.4 C) (11/30 0449) Pulse Rate:  [69-75] 70 (11/30 0449) Resp:  [16-20] 20 (11/30 0449) BP: (91-127)/(46-67) 98/53 (11/30 0449) SpO2:  [86 %-99 %] 89 % (11/30 0449) Weight:  [75.6 kg] 75.6 kg (11/30 0526)     Height: 6\' 2"  (188 cm) Weight: 75.6 kg BMI (Calculated): 21.39   Intake/Output last 2 shifts:  11/29 0701 - 11/30 0700 In: 2980.9 [I.V.:1757.8; NG/GT:1061; IV Piggyback:162] Out: 380 [Drains:380]   Physical Exam:  Constitutional:laying in bed, cachetic, appears uncomfortable Respiratory: breathing non-labored at rest  Cardiovascular: regular rate and sinus rhythm  Gastrointestinal:Soft, tenderness over incisions, non-distended, no rebound/guarding, JP in the RLQ, output is now bilious, jejunostomy tube in the left mid-abdomen Integumentary:Laparoscopic and laparotomy incisions are CDI with staples, no drainage or erythema  Labs:  CBC Latest Ref Rng & Units 02/10/2020 02/09/2020 02/08/2020  WBC 4.0 - 10.5 K/uL 4.6 6.9 13.3(H)  Hemoglobin 13.0 - 17.0 g/dL 8.2(L) 7.9(L) 9.2(L)  Hematocrit 39 - 52 %  26.6(L) 26.4(L) 29.8(L)  Platelets 150 - 400 K/uL 244 230 302   CMP Latest Ref Rng & Units 02/13/2020 02/12/2020 02/11/2020  Glucose 70 - 99 mg/dL - 143(H) 150(H)  BUN 8 - 23 mg/dL - 32(H) 37(H)  Creatinine 0.61 - 1.24 mg/dL - 1.09 1.17  Sodium 135 - 145 mmol/L 148(H) 154(H) 152(H)  Potassium 3.5 - 5.1 mmol/L - 3.7 3.1(L)  Chloride 98 - 111 mmol/L - 116(H) 116(H)  CO2 22 - 32 mmol/L - 28 29  Calcium 8.9 - 10.3 mg/dL - 8.2(L) 8.4(L)  Total Protein 6.5 - 8.1 g/dL - - -  Total Bilirubin 0.3 - 1.2 mg/dL - - -  Alkaline Phos 38 - 126 U/L - - -  AST 15 - 41 U/L - - -  ALT 0 - 44 U/L - - -     Imaging studies: No new pertinent imaging studies, CT Abdomen/Pelvis pending   Assessment/Plan:  85 y.o. very debilitated male with now likely leak 7 Days Post-Op s/p exploratory laparotomy, duodenal ulcer repair with Phillip Heal patch, pyloric exclusiongastrojejunostomy and omega loop, feeding jejunostomy, and open cholecystectomyfor perforated duodenal ulcer, complicated bya multitudepertinent comorbidities.   - Unfortunately this is a very difficult situation with no good answers and I believe he is getting worse this morning given increase in leukocytosis and worsening renal function. He may be in dying process. The surgical team has been attempting to discuss this with patient's family which has been challenging. His son was agreeable to making him DNR and seems understanding of the situation. I have placed a consult for palliative care to hopefully have a family meeting  to establish [place].    - We will also get repeat CT Abdomen/Pelvis this morning to reassess intra-abdominal process and rule out any undrained collections.     - Continue strict NPO + tube feedings + free water   - Continue IV Zosyn - Continue IV PPI; 40 mg BID - Monitor abdominal examination; on-going bowel function - Pain control prn; antiemetics prn - Maintainsurgical drain;  mnitor and record output - Monitor renal function; morning labs - Appreciate medicine assistance with comorbid conditions conditions   All of the above findings and recommendations were discussed with the medical team. We will continue to attempt to discuss this with patient's family when available.   -- Edison Simon, PA-C [facility] 02/13/2020, 7:07 AM [phone] M-F: 7am - 4pm

## 2020-02-13 NOTE — Progress Notes (Signed)
+ [facility] NAME: Thomas [name]    MR#:  [id number]  DATE OF BIRTH:  1934-09-01  SUBJECTIVE:  Pt is very lethargic--barely able to keep eyes open   REVIEW OF SYSTEMS:   Review of Systems  Unable to perform ROS: Language  Constitutional: Negative for chills, fever and weight loss.  HENT: Negative for ear discharge, ear pain and nosebleeds.   Eyes: Negative for blurred vision, pain and discharge.  Respiratory: Negative for sputum production, shortness of breath, wheezing and stridor.   Cardiovascular: Negative for chest pain, palpitations, orthopnea and PND.  Gastrointestinal: Positive for abdominal pain and nausea. Negative for diarrhea and vomiting.  Genitourinary: Negative for frequency and urgency.  Musculoskeletal: Negative for back pain and joint pain.  Neurological: Positive for weakness. Negative for sensory change, speech change and focal weakness.  Psychiatric/Behavioral: Negative for depression and hallucinations. The patient is nervous/anxious.    Tolerating Diet:getting TF  Tolerating PT: rec rehab  DRUG ALLERGIES:   Allergies  Allergen Reactions  . Entresto [Sacubitril-Valsartan] Swelling    And bruising of arm  . Phenazopyridine Nausea Only and Other (See Comments)    GI UPSET  . Ramipril Other (See Comments)    unk Other reaction(s): Other (See Comments), Unknown unk    VITALS:  Blood pressure (!) 89/50, pulse 70, temperature 98.8 F (37.1 C), resp. rate 20, height 6\' 2"  (1.88 m), weight 75.6 kg, SpO2 95 %.  PHYSICAL EXAMINATION:   Physical Exam  GENERAL:  85 y.o.-year-old patient lying in the bed with no acute distress.appear weak and deconditioned, malnourished  Appears chronically ill, thin, cachectic, malnourished HEENT: Head atraumatic, normocephalic.   Dry oral mucosa. Poor dentition LUNGS: shallow breath sounds bilaterally, no wheezing, rales, rhonchi. No use of accessory muscles of  respiration.  CARDIOVASCULAR: S1, S2 normal. No murmurs, rubs, or gallops.  ABDOMEN: Soft, nontender, nondistended. Bowel sounds present. No organomegaly or mass. G-tube, post surgical drains+   NEUROLOGIC: grossly nonfocal. Very deconditioned. Moves all extremities well. PSYCHIATRIC:  patient is alert and awake SKIN:  Pressure Injury 06/21/19 Buttocks Mid Stage 2 -  Partial thickness loss of dermis presenting as a shallow open injury with a red, pink wound bed without slough. (Active)  06/21/19 0700  Location: Buttocks  Location Orientation: Mid  Staging: Stage 2 -  Partial thickness loss of dermis presenting as a shallow open injury with a red, pink wound bed without slough.  Wound Description (Comments):   Present on Admission:    LABORATORY PANEL:  CBC Recent Labs  Lab 02/13/20 0718  WBC 13.0*  HGB 10.3*  HCT 34.3*  PLT 339    Chemistries  Recent Labs  Lab 02/13/20 0718  NA 146*  K 4.0  CL 109  CO2 23  GLUCOSE 149*  BUN 47*  CREATININE 1.91*  CALCIUM 7.9*  MG 2.5*  AST 25  ALT 13  ALKPHOS 118  BILITOT 1.4*   Cardiac Enzymes No results for input(s): TROPONINI in the last 168 hours. RADIOLOGY:  DG Abd 1 View  Result Date: 02/12/2020 CLINICAL DATA:  Postop repair perforated duodenal/gastric ulcer with gastrojejunostomy. Limited upper GI series performed earlier today as patient here for delayed KUB. EXAM: ABDOMEN - 1 VIEW COMPARISON:  Limited upper GI series earlier today. FINDINGS: Examination demonstrates persistent moderate amount contrast over the stomach in the left upper quadrant. There is now dilute contrast present within several adjacent small bowel loops in the left upper  quadrant likely filling via the gastrojejunostomy, however the gastrojejunostomy anastomosis is not well visualized. Remainder of the exam is unchanged. IMPRESSION: Evidence of delayed passage of small amount of contrast from the stomach into the adjacent small bowel loops via patient's  gastrojejunostomy, although the gastrojejunostomy anastomosis is not well visualized on this exam. Further delayed imaging may be obtained as clinically indicated. Electronically Signed   By: Marin Olp M.D.   On: 02/12/2020 13:18   CT ABDOMEN PELVIS W CONTRAST  Result Date: 02/13/2020 CLINICAL DATA:  85 year old male with history of perforated duodenal ulcer repair and gastrojejunostomy. Suspected postoperative leak. Evaluate for abdominal abscess/infection. EXAM: CT ABDOMEN AND PELVIS WITH CONTRAST TECHNIQUE: Multidetector CT imaging of the abdomen and pelvis was performed using the standard protocol following bolus administration of intravenous contrast. CONTRAST:  65mL OMNIPAQUE IOHEXOL 300 MG/ML  SOLN COMPARISON:  CT the abdomen and pelvis 01/23/2020. FINDINGS: Lower chest: Moderate to large bilateral pleural effusions lying dependently. Areas of atelectasis in the lower lobes of the lungs bilaterally. Cardiomegaly. Atherosclerotic calcifications in the descending thoracic aorta as well as the left anterior descending, left circumflex and right coronary arteries. Mild calcifications of the aortic valve. Pacemaker leads in the right atrium, right ventricle and overlying the lateral wall the left ventricle via the coronary sinus and coronary veins. Status post median sternotomy for CABG. Hepatobiliary: Liver has a shrunken appearance and nodular contour, indicative of advanced cirrhosis. Low-attenuation lesion in the anterior aspect of segment 4A of the liver, compatible with a cyst measuring 2.5 cm. No other suspicious hepatic lesions. No intra or extrahepatic biliary ductal dilatation. Gallbladder is either completely decompressed or surgically absent. Pancreas: No definite pancreatic mass or peripancreatic fluid collections or inflammatory changes. Spleen: Calcified granulomas in the spleen. Adrenals/Urinary Tract: Severe atrophy of the anterior aspect of the right kidney. Subcentimeter low-attenuation  lesion in the anterior aspect of the upper pole of the left kidney, too small to characterize, but statistically likely to represent a tiny cyst. Bilateral adrenal glands are normal in appearance. No hydroureteronephrosis. Urinary bladder is remarkable for gas non dependently. No indwelling Foley balloon catheter is noted at this time. Urinary bladder is otherwise unremarkable in appearance. Stomach/Bowel: Postoperative changes of gastrojejunostomy are noted. The anastomosis between the stomach in the jejunum appears intact. However, there is a large amount of extraluminal contrast and gas adjacent to the proximal duodenum, suggesting residual perforation. This contrast extends into the adjacent portions of the peritoneal cavity. A few scattered colonic diverticulae are noted, without surrounding inflammatory changes to suggest an acute diverticulitis at this time. Appendix is not confidently identified. Vascular/Lymphatic: Aortic atherosclerosis with aorto bi-iliac stent graft in place. No aneurysm or dissection noted in the abdominal or pelvic vasculature. No lymphadenopathy noted in the abdomen or pelvis. Reproductive: Fiducial markers in the prostate gland. Seminal vesicles are unremarkable in appearance. Other: Surgical drain extending from the right-side of the abdomen into the left subhepatic region. Moderate volume of ascites, most evident adjacent to the liver. Some of this ascites appears partially loculated with some enhancement of peritoneal membranes, most evident adjacent to the inferior aspect of the right lobe of the liver (axial image 47 of series 2), and in the right-side of the abdomen where there is a well-defined fluid collection measuring 5.4 x 2.8 cm which is exerting local mass effect, concerning for potential abscess. Moderate volume of pneumoperitoneum. Musculoskeletal: There are no aggressive appearing lytic or blastic lesions noted in the visualized portions of the skeleton. IMPRESSION:  1. Status post duodenal ulcer repair and gastrojejunostomy with persistent perforation of proximal duodenum with large volume leak into the peritoneal cavity, moderate volume of ascites including loculated areas of ascites and some areas that appear concerning for potential abscess formation. 2. Moderate to large bilateral pleural effusions with areas of passive subsegmental atelectasis in the lower lobes of the lungs bilaterally. 3. Small amount of gas non dependently within the lumen of the urinary bladder. This may be iatrogenic if there has been recent catheterization. In the absence of recent catheterization, correlation with urinalysis would be recommended to exclude the possibility of urinary tract infection with gas-forming organisms. 4. Morphologic changes in the liver indicative of advanced cirrhosis. 5. Aortic atherosclerosis, in addition to least 3 vessel coronary artery disease. 6. Cardiomegaly. 7. Additional incidental findings, as above. Critical Value/emergent results were called by telephone at the time of interpretation on 02/13/2020 at 11:45 am to provider [facility] , who verbally acknowledged these results. Electronically Signed   By: Vinnie Langton M.D.   On: 02/13/2020 11:54   DG UGI W SINGLE CM (SOL OR THIN BA)  Addendum Date: 02/12/2020   ADDENDUM REPORT: 02/12/2020 12:17 ADDENDUM: Patient was brought back for re-evaluation via water-soluble oral contrast. Patient unable to sip via straw as sips via cup were administered. Exam somewhat difficult due to patient immobility. Visualized esophagus is normal. A moderate amount of contrast enters the body of the stomach over the midline and left of midline. Patient was placed into the supine as well as steep left and right oblique positions and table elevated 45 degrees. Patient was imaged for 10 minutes without contrast seen leaving the stomach in antegrade fashion through the gastrojejunostomy. Small amount of gastroesophageal reflux  was noted. Procedure was terminated at this point with plan for delayed KUB in 1 hour to further assess patency of the gastrojejunostomy anastomosis. Electronically Signed   By: Marin Olp M.D.   On: 02/12/2020 12:17   Result Date: 02/12/2020 CLINICAL DATA:  Post gastrojejunostomy. Recent laparotomy/cholecystectomy 02/10/2020. EXAM: WATER SOLUBLE UPPER GI SERIES TECHNIQUE: Single-column upper GI series was performed using water soluble contrast. CONTRAST:  50% dilute Omnipaque 300 COMPARISON:  CT 02/02/2020 FLUOROSCOPY TIME:  Fluoroscopy Time:  1 minutes 4 seconds Radiation Exposure Index (if provided by the fluoroscopic device): 30.9 mGy Number of Acquired Spot Images: 6 FINDINGS: Precontrast KUB demonstrates a surgical drain with tip over the stomach in the left upper quadrant. Single air-filled nondilated small bowel loops over the left upper quadrant. Embolization material over the right upper quadrant. Aortic stent graft in place. Patient's jejunostomy tube was injected as contrast flows freely into the jejunum without evidence of extravasation. Tip of tube is located over the left lower quadrant. Contrast moves freely through the small bowel without evidence of obstruction. No evidence of retrograde flow of contrast to the stomach. Remainder the exam is unremarkable. IMPRESSION: Jejunostomy tube injection with no evidence of extravasation or small bowel obstruction. Tube in adequate position and functioning normally. Electronically Signed: By: Marin Olp M.D. On: 02/12/2020 09:41   ASSESSMENT AND PLAN:   Mr. Iwanicki is an 85 y/o with a complex medical h/o including combined heart failure, mitral and tricuspid valvular disease with regurgitation, permanent a. 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 Patient came in with acute abdominal pain leading to presentation to [facility] ED where evaluation revealed peritonitis and intraabdominal free  air.  Perforated duodenal ulcer  -  patient present with acute perforation and peritonitis requiring emergency surgery. Complex surgery (see op-note) with Patch to duodenal perforation, pyloric diversion with creation of gastrojejunostomy and placement of jejunostomy feeding tube, cholecystectomy. -cont empiric IV Zosyn coverage   -Pain mgt per surgery  -IV PPI -Post-op day 7 -Upper GI on 11/29--was done via J tube showed no blockage. Needs another Upper GI with po contrast --11/30-- CT abdomen shows persistent leak and s/o peritonitis --Palliative care/Surgery  trying to meet with wife to d/w her about pt's very grave prognosis.  Hypernatremia/hypokalemia/Hypophosphatemia suspect due to GI losses --increased free water to 150 cc q4 hourly --Change to IV d5W --received KCL--K is 3.7 -Na 143--148--1/2 TR--320--233--I3H--686  Chronic systolic congestive heart failure with history of moderate to severe tricuspid regurgitation, moderate mitral valve regurgitation.  -recently underwent thoracentesis  R&L for transudative effusion. Post procedure improved respiratory function. -- At home patient is on Aldactone and torsemide-- on HOLD --11/27--good uop >2.3Liters   Permanent atrial fibrillation - not a candidate for anticoagulation.  -Follows with [facility] cardiology. -not any rate blocking meds at home           HTN - as outpatient [facility] -- hold BP meds since blood pressure is low/soft. --pt remains hypotensive   HLD - holding meds   Acute on CKD stage IIIa -baseline creatinine  1.5--1.9 -creat today 1.0-->1.9 -avoid nephrotoxins  Severe malnutrition -Nutrition Status: Nutrition Problem: Severe Malnutrition Etiology: chronic illness (CHF, CKD) Signs/Symptoms: percent weight loss, severe fat depletion, severe muscle depletion Percent weight loss: 17.7 % Interventions: Refer to RD note for recommendations follow dietitian recommendations Tolerating TF  Pressure Injury  06/21/19 Buttocks Mid Stage 2 -  Partial thickness loss of dermis presenting as a shallow open injury with a red, pink wound bed without slough. (Active)  06/21/19 0700  Location: Buttocks  Location Orientation: Mid  Staging: Stage 2 -  Partial thickness loss of dermis presenting as a shallow open injury with a red, pink wound bed without slough.  Wound Description (Comments):   Present on Admission:      Procedures: laparoscopic repair of duodenal ulcer, G-tube placement, open cholecystectomy. CODE STATUS: DNR (Dr Dahlia Byes spoke with son) DVT Prophylaxis :per surgery (primary team)  Status is: Inpatient  Overall poor/grave prognosis Palliative care consulted     TOTAL TIME TAKING CARE OF THIS PATIENT: 25* minutes.  >50% time spent on counselling and coordination of care  Note: This dictation was prepared with Dragon dictation along with smaller phrase technology. Any transcriptional errors that result from this process are unintentional.  Fritzi Mandes M.D    [facility]   CC: Primary care physician; [name], MDPatient ID: Thomas [name], male   DOB: 1934-09-21, 85 y.o.   MRN: [id number]

## 2020-02-13 NOTE — Progress Notes (Signed)
PT Cancellation Note  Patient Details Name: TOME WILSON MRN: [id number] DOB: 1934/10/19   Cancelled Treatment:    Reason Eval/Treat Not Completed: Medical issues which prohibited therapy: Hold PT services this date per PA Z. Olean Ree secondary to decline in status.  Will attempt to see pt at a future date/time as medically appropriate.     Linus Salmons PT, DPT 02/13/20, 1:31 PM

## 2020-02-13 NOTE — Telephone Encounter (Signed)
New message:      Patient wife calling to let the doctor know patient is the hospital.

## 2020-02-14 NOTE — Progress Notes (Signed)
Daily Progress Note   Patient Name: Thomas [name]       Date: 02/14/2020 DOB: [birth year]  Age: 85 y.o. MRN#: [id number] Attending Physician: Jules Husbands, MD Primary Care Physician: [name], MD Admit Date: 01/17/2020  Reason for Consultation/Follow-up: Terminal Care and Withdrawal of life-sustaining treatment  Subjective: Patient is resting in bed with no family at bedside. No distress noted, he appears comfortable. He does not respond to touch (he is deaf). No changes to medications recommended at this time.      Length of Stay: 8  Current Medications: Scheduled Meds:  . acetaminophen (TYLENOL) oral liquid 160 mg/5 mL  1,000 mg Per Tube Q6H  . gabapentin  200 mg Per Tube Q8H  . sodium chloride flush  3 mL Intravenous Q12H    Continuous Infusions:   PRN Meds: antiseptic oral rinse, glycopyrrolate **OR** glycopyrrolate **OR** glycopyrrolate, haloperidol **OR** haloperidol **OR** haloperidol lactate, LORazepam **OR** LORazepam **OR** LORazepam, morphine injection, ondansetron **OR** ondansetron (ZOFRAN) IV, polyvinyl alcohol, sodium chloride flush  Physical Exam Constitutional:      Comments: Eyes half open. Mouth open. Slightly tachpnic. In dying process.              Vital Signs: BP (!) 101/48 (BP Location: Left Arm)   Pulse 70   Temp 98.7 F (37.1 C) (Axillary)   Resp 20   Ht 6\' 2"  (1.88 m)   Wt 75.6 kg   SpO2 95%   BMI 21.40 kg/m  SpO2: SpO2: 95 % O2 Device: O2 Device: Room Air O2 Flow Rate: O2 Flow Rate (L/min): 2 L/min  Intake/output summary: No intake or output data in the 24 hours ending 02/14/20 1233 LBM: Last BM Date: 02/13/20 Baseline Weight: Weight: 79 kg Most recent weight: Weight: 75.6 kg       Palliative Assessment/Data:  10%      Patient Active Problem List   Diagnosis Date Noted  . Peritonitis ([place])   . Hypernatremia   . Protein-calorie malnutrition, severe 02/07/2020  . Perforated ulcer ([place])   . Perforated duodenal ulcer ([place]) 02/10/2020  . Perforated chronic duodenal ulcer ([place]) 01/22/2020  . Respiratory failure with hypoxia ([place]) 01/26/2020  . Chronic diastolic CHF (congestive heart failure) ([place]) 01/15/2020  . Duodenal ulceration   . 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 Candidal intertrigo 08/24/2019  .  Edema of left upper extremity 08/24/2019  . Pneumonia of left lower lobe due to group B Streptococcus ([place]) 08/24/2019  . Heart block AV complete ([place]) 08/23/2019  . Sepsis due to group B Streptococcus ([place]) 08/20/2019  . Acute blood loss anemia 08/20/2019  . HCAP (healthcare-associated pneumonia) 08/18/2019  . Severe sepsis with septic shock ([place]) 08/18/2019  . Acute renal failure superimposed on stage 3a chronic kidney disease ([place]) 08/18/2019  . Black stool 08/18/2019  . Iron deficiency anemia 08/18/2019  . Group B streptococcal bacteriuria 06/18/2019  . Lactic acidosis 06/18/2019  . Pressure ulcer, stage 2 ([place]) 06/18/2019  . Bacteremia due to group B Streptococcus 06/17/2019  . SIRS (systemic inflammatory response syndrome) ([place]) 06/16/2019  . Leg swelling   . Decompensated hepatic cirrhosis ([place]) 05/22/2019  . Anasarca 05/19/2019  . CRI (chronic renal insufficiency), stage 3 (moderate) ([place]) 01/30/2019  . Iron deficiency anemia due to chronic blood loss 11/02/2018  . Coronary artery disease of bypass graft of native heart with stable angina pectoris ([place]) 11/02/2018  . Pure hypercholesterolemia 11/02/2018  . Biventricular automatic implantable cardioverter defibrillator in situ 11/02/2018  . GIB (gastrointestinal bleeding) 09/14/2018  . Hypotension 08/14/2018  . Chronic systolic CHF (congestive heart failure) ([place]) 08/12/2018  . Acute on chronic combined systolic  and diastolic CHF (congestive heart failure) ([place])   . GI bleed 05/26/2018  . Occult GI bleeding 05/25/2018  . Normocytic anemia 05/25/2018  . Elevated troponin 05/25/2018  . Lymphedema 02/28/2018  . Demand ischemia ([place]) 07/15/2016  . Weakness 07/15/2016  . Fatigue 07/15/2016  . Stroke (cerebrum) ([place]) 10/31/2015  . Bulbous urethral stricture 09/18/2015  . Bilateral deafness 08/19/2015  . Bilateral cataracts 08/19/2015  . Acid reflux 08/19/2015  . Mixed hyperlipidemia 08/19/2015  . Essential hypertension 08/19/2015  . Myocardial infarction ([place]) 08/19/2015  . Calculus of kidney 08/19/2015  . Pacemaker 08/19/2015  . Gastroduodenal ulcer 08/19/2015  . Dupuytren's contracture of foot 08/19/2015  . Malignant neoplasm of prostate ([place]) 08/19/2015  . Microhematuria 08/19/2015  . CAD S/P multiple PCIs 02/22/2015  . Status post coronary artery bypass grafting 02/22/2015  . Benign essential HTN 04/02/2014  . Hematochezia 02/20/2014  . Tricuspid valve regurgitation, nonrheumatic 02/20/2014  . Mobitz type II atrioventricular block 12/12/2013  . Long term current use of anticoagulant 10/18/2013  . Cardiomyopathy, ischemic 11/11/2012  . Permanent atrial fibrillation ([place]) 11/11/2012  . Biventricular ICD in place 11/11/2012  . Chronic combined systolic and diastolic heart failure ([place]) 11/11/2012    Palliative Care Assessment & Plan   Recommendations/Plan:  Patient is in dying process and is not stable for transport to an outside facility. Continuing comfort care with anticipated hospital death.     Code Status:    Code Status Orders  (From admission, onward)         Start     Ordered   02/13/20 1348  Do not attempt resuscitation (DNR)  Continuous       Question Answer Comment  In the event of cardiac or respiratory ARREST Do not call a "code blue"   In the event of cardiac or respiratory ARREST Do not perform Intubation, CPR, defibrillation or ACLS   In the event of  cardiac or respiratory ARREST Use medication by any route, position, wound care, and other measures to relive pain and suffering. May use oxygen, suction and manual treatment of airway obstruction as needed for comfort.   Comments MOST form in chart.      02/13/20 1351  Code Status History    Date Active Date Inactive Code Status Order ID Comments User Context   02/12/2020 1611 02/13/2020 1351 DNR [id number]  Jules Husbands, MD Inpatient   01/18/2020 2028 02/12/2020 1610 Full Code [id number]  Carlus Pavlov Inpatient   01/26/2020 1756 01/31/2020 1937 Full Code [id number]  Val Riles, MD Inpatient   01/15/2020 0827 01/21/2020 1848 Full Code [id number]  Collier Bullock, MD ED   08/25/2019 2025 09/13/2019 0016 Full Code [id number]  [name], MD Inpatient   08/23/2019 1729 08/25/2019 2025 Full Code [id number]  [name] Chaco, PA-C Inpatient   08/19/2019 0111 08/23/2019 1729 Full Code [id number]  Ivor Costa, MD Inpatient   06/17/2019 0007 06/25/2019 1925 Full Code [id number]  Gwynne Edinger, MD ED   05/19/2019 1918 05/30/2019 2312 Full Code [id number]  Vashti Hey, MD ED   09/14/2018 0603 09/16/2018 1641 Full Code [id number]  Harrie Foreman, MD Inpatient   08/12/2018 0344 08/17/2018 1736 Full Code [id number]  Etta Quill, DO ED   05/25/2018 2042 05/28/2018 1654 Full Code [id number]  Vianne Bulls, MD ED   07/15/2016 0929 07/15/2016 1636 Full Code [id number]  Bettey Costa, MD Inpatient   10/31/2015 1444 11/01/2015 1921 Full Code [id number]  Nicholes Mango, MD Inpatient   09/25/2013 1137 09/26/2013 1502 Full Code [id number]  Sherren Mocha, MD Inpatient   09/21/2013 1444 09/25/2013 1137 Full Code [id number]  Sherren Mocha, MD Inpatient   12/16/2012 1648 12/17/2012 1639 Full Code [id number]  [name], MD Inpatient   Advance Care Planning Activity       Prognosis:   Hours - Days   Thank you for allowing the Palliative Medicine Team to assist in the care of this  patient.   Total Time 15 min Prolonged Time Billed no      Greater than 50%  of this time was spent counseling and coordinating care related to the above assessment and plan.  Asencion Gowda, NP  Please contact Palliative Medicine Team phone at [phone] for questions and concerns.

## 2020-02-14 NOTE — Progress Notes (Signed)
[place] Hospital Day(s): 8.   Post op day(s): 8 Days Post-Op.   Interval History:  Patient seen and examined Patient transitioned to comfort measures only yesterday No family at bedside this morning Resting comfortably, no major changes per discussion with RN  Vital signs in last 24 hours: [min-max] current  Temp:  [98.4 F (36.9 C)-99.3 F (37.4 C)] 98.4 F (36.9 C) (11/30 2343) Pulse Rate:  [69-70] 69 (11/30 2343) Resp:  [16-20] 16 (11/30 2343) BP: (89-93)/(43-78) 93/52 (11/30 2343) SpO2:  [90 %-95 %] 94 % (11/30 2343)     Height: 6\' 2"  (188 cm) Weight: 75.6 kg BMI (Calculated): 21.39   Intake/Output last 2 shifts:  No intake/output data recorded.   Physical Exam:  Constitutional:laying in bed, cachetic, appears comfortable Respiratory: breathing non-labored at rest  Cardiovascular: regular rate and sinus rhythm  Gastrointestinal:JP in the RLQ, output is now bilious, jejunostomy tube in the left mid-abdomen Integumentary:Laparoscopic and laparotomy incisions are CDI with staples, no drainage or erythema  Labs:  CBC Latest Ref Rng & Units 02/13/2020 02/10/2020 02/09/2020  WBC 4.0 - 10.5 K/uL 13.0(H) 4.6 6.9  Hemoglobin 13.0 - 17.0 g/dL 10.3(L) 8.2(L) 7.9(L)  Hematocrit 39 - 52 % 34.3(L) 26.6(L) 26.4(L)  Platelets 150 - 400 K/uL 339 244 230   CMP Latest Ref Rng & Units 02/13/2020 02/13/2020 02/12/2020  Glucose 70 - 99 mg/dL 149(H) - 143(H)  BUN 8 - 23 mg/dL 47(H) - 32(H)  Creatinine 0.61 - 1.24 mg/dL 1.91(H) - 1.09  Sodium 135 - 145 mmol/L 146(H) 148(H) 154(H)  Potassium 3.5 - 5.1 mmol/L 4.0 - 3.7  Chloride 98 - 111 mmol/L 109 - 116(H)  CO2 22 - 32 mmol/L 23 - 28  Calcium 8.9 - 10.3 mg/dL 7.9(L) - 8.2(L)  Total Protein 6.5 - 8.1 g/dL 5.3(L) - -  Total Bilirubin 0.3 - 1.2 mg/dL 1.4(H) - -  Alkaline Phos 38 - 126 U/L 118 - -  AST 15 - 41 U/L 25 - -  ALT 0 - 44 U/L 13 - -     Imaging studies: No new pertinent  imaging studies   Assessment/Plan:  85 y.o. male 8 Days Post-Op s/p exploratory laparotomy, duodenal ulcer repair with Phillip Heal patch, pyloric exclusiongastrojejunostomy and omega loop, feeding jejunostomy, and open cholecystectomyfor perforated duodenal ulcer, complicated bya multitudepertinent comorbidities.   - Transitioned to comfort measures on 11/30. Patient is in dying process. Continue with comfort measures at this time. Appreciate palliative care assistance. Anticipate hospital death.   - We will be available as needed should family have questions  All of the above findings and recommendations were discussed with the medical team.  -- Edison Simon, PA-C [facility] 02/14/2020, 7:05 AM [phone] M-F: 7am - 4pm

## 2020-02-14 NOTE — Progress Notes (Signed)
[place] visited pt. per suggestion from RN; pt. is on comfort care and, per chart, is deaf.  Pt. lying in bed, mouth open, eyes fixed ahead of him.  [place] attempted to make pt. aware of [place] presence through gentle touch and standing in pt.'s field of vision, but no response was evident.  CH offered silent prayer or pt.'s sense of calm and peace.  CH remains available as needed.

## 2020-02-14 NOTE — Progress Notes (Signed)
Updated pt son on the current status. Pt is currently lying in bed with obvious signs of life still present. Chest expansion is symmetircal and skin is still warm to touch. No signs of cynosis present. This nurse informs sons that if any changes deviate from this present baseline, I would call to inform the family. Pt son verbally agrees and is appreciative of the update.

## 2020-02-14 NOTE — Care Management Important Message (Signed)
Important Message  Patient Details  Name: Thomas [name] MRN: [id number] Date of Birth: 11-04-1934   Medicare Important Message Given:  Other (see comment)  Patient placed on comfort care and out of respect for the patient and family no Important Message from Medicare given.  [name] A Niaja Stickley 02/14/2020, 7:41 AM

## 2020-02-14 DEATH — deceased

## 2020-02-15 NOTE — Progress Notes (Signed)
Upon assessment of pt this AM, pt is lying in bed with eyes open and pupils fixed with mouth open. Pt had no blink to threat reaction nor any movement when touched on the shoulder. Breathing was non-labored, chest symmetrical. Pt appears to be comfortable at this time. However, pt touched on chin and he became alert but then seemed to drift back into a daze/asleep with eyes still open.

## 2020-02-15 NOTE — Progress Notes (Signed)
[place] Hospital Day(s): 9.   Post op day(s): 9 Days Post-Op.   Interval History:  Patient seen and examined No acute events or new complaints overnight. Patient on comfort measures only He does not appear uncomfortable No family present  Vital signs in last 24 hours: [min-max] current  Temp:  [98 F (36.7 C)-98.7 F (37.1 C)] 98 F (36.7 C) (12/02 0820) Pulse Rate:  [70-74] 70 (12/02 0820) Resp:  [15-20] 15 (12/02 0820) BP: (101-103)/(48-57) 101/57 (12/02 0820) SpO2:  [95 %-99 %] 99 % (12/02 0820)     Height: 6\' 2"  (188 cm) Weight: 75.6 kg BMI (Calculated): 21.39   Intake/Output last 2 shifts:  No intake/output data recorded.   Physical Exam:  Constitutional:laying in bed, cachetic, appears comfortable Respiratory: short and shallow breathing Cardiovascular: regular rate and sinus rhythm  Gastrointestinal:JP in the RLQ, output is nowbilious, jejunostomy tube in the left mid-abdomen Integumentary:Laparoscopic and laparotomy incisions are CDI with staples, no drainage or erythema  Labs:  CBC Latest Ref Rng & Units 02/13/2020 02/10/2020 02/09/2020  WBC 4.0 - 10.5 K/uL 13.0(H) 4.6 6.9  Hemoglobin 13.0 - 17.0 g/dL 10.3(L) 8.2(L) 7.9(L)  Hematocrit 39 - 52 % 34.3(L) 26.6(L) 26.4(L)  Platelets 150 - 400 K/uL 339 244 230   CMP Latest Ref Rng & Units 02/13/2020 02/13/2020 02/12/2020  Glucose 70 - 99 mg/dL 149(H) - 143(H)  BUN 8 - 23 mg/dL 47(H) - 32(H)  Creatinine 0.61 - 1.24 mg/dL 1.91(H) - 1.09  Sodium 135 - 145 mmol/L 146(H) 148(H) 154(H)  Potassium 3.5 - 5.1 mmol/L 4.0 - 3.7  Chloride 98 - 111 mmol/L 109 - 116(H)  CO2 22 - 32 mmol/L 23 - 28  Calcium 8.9 - 10.3 mg/dL 7.9(L) - 8.2(L)  Total Protein 6.5 - 8.1 g/dL 5.3(L) - -  Total Bilirubin 0.3 - 1.2 mg/dL 1.4(H) - -  Alkaline Phos 38 - 126 U/L 118 - -  AST 15 - 41 U/L 25 - -  ALT 0 - 44 U/L 13 - -     Imaging studies: No new pertinent imaging  studies   Assessment/Plan:  85 y.o. male 9 Days Post-Op s/p exploratory laparotomy, duodenal ulcer repair with Phillip Heal patch, pyloric exclusiongastrojejunostomy and omega loop, feeding jejunostomy, and open cholecystectomyfor perforated duodenal ulcer, complicated bya multitudepertinent comorbidities.   - Transitioned to comfort measures on 11/30. Patient is in dying process. Continue with comfort measures at this time. Appreciate palliative care assistance. Anticipate hospital death.              - We will be available as needed should family have questions  All of the above findings and recommendations were discussed with the medical team.  -- Edison Simon, PA-C  [facility] 02/15/2020, 8:54 AM [phone] M-F: 7am - 4pm

## 2020-02-16 ENCOUNTER — Inpatient Hospital Stay: Payer: PPO | Admitting: Oncology

## 2020-02-16 ENCOUNTER — Inpatient Hospital Stay: Payer: PPO

## 2020-02-16 NOTE — Progress Notes (Addendum)
Daily Progress Note   Patient Name: Thomas [name]       Date: 02/16/2020 DOB: 12/10/1934  Age: 85 y.o. MRN#: [id number] Attending Physician: Jules Husbands, MD Primary Care Physician: [name], MD Admit Date: 02/05/2020  Reason for Consultation/Follow-up: Terminal Care and Withdrawal of life-sustaining treatment  Subjective: Patient is resting in bed with no family at bedside. No distress noted, he appears comfortable. He is non-responsive. No changes to medications recommended at this time.      Length of Stay: 10  Current Medications: Scheduled Meds:  . acetaminophen (TYLENOL) oral liquid 160 mg/5 mL  1,000 mg Per Tube Q6H  . gabapentin  200 mg Per Tube Q8H  . sodium chloride flush  3 mL Intravenous Q12H    Continuous Infusions:   PRN Meds: antiseptic oral rinse, glycopyrrolate **OR** glycopyrrolate **OR** glycopyrrolate, haloperidol **OR** haloperidol **OR** haloperidol lactate, LORazepam **OR** LORazepam **OR** LORazepam, morphine injection, ondansetron **OR** ondansetron (ZOFRAN) IV, polyvinyl alcohol, sodium chloride flush  Physical Exam Constitutional:      Comments: Eyes half open. Mouth open. Slightly tachpnic. In dying process.  Non-responsive.   Skin:    Comments: Feet are cold             Vital Signs: BP (!) 101/57 (BP Location: Right Arm)   Pulse 70   Temp 98 F (36.7 C) (Oral)   Resp 15   Ht 6\' 2"  (1.88 m)   Wt 75.6 kg   SpO2 99%   BMI 21.40 kg/m  SpO2: SpO2: 99 % O2 Device: O2 Device: Room Air O2 Flow Rate: O2 Flow Rate (L/min): 2 L/min  Intake/output summary: No intake or output data in the 24 hours ending 02/16/20 1101 LBM: Last BM Date: 02/14/20 Baseline Weight: Weight: 79 kg Most recent weight: Weight: 75.6 kg       Palliative  Assessment/Data: 10%      Patient Active Problem List   Diagnosis Date Noted  . Peritonitis ([place])   . Hypernatremia   . Protein-calorie malnutrition, severe 02/07/2020  . Perforated ulcer ([place])   . Perforated duodenal ulcer ([place]) 02/09/2020  . Perforated chronic duodenal ulcer ([place]) 02/01/2020  . Respiratory failure with hypoxia ([place]) 01/26/2020  . Chronic diastolic CHF (congestive heart failure) ([place]) 01/15/2020  . Duodenal ulceration   . 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  Candidal intertrigo 08/24/2019  . Edema of left upper extremity 08/24/2019  . Pneumonia of left lower lobe due to group B Streptococcus ([place]) 08/24/2019  . Heart block AV complete ([place]) 08/23/2019  . Sepsis due to group B Streptococcus ([place]) 08/20/2019  . Acute blood loss anemia 08/20/2019  . HCAP (healthcare-associated pneumonia) 08/18/2019  . Severe sepsis with septic shock ([place]) 08/18/2019  . Acute renal failure superimposed on stage 3a chronic kidney disease ([place]) 08/18/2019  . Black stool 08/18/2019  . Iron deficiency anemia 08/18/2019  . Group B streptococcal bacteriuria 06/18/2019  . Lactic acidosis 06/18/2019  . Pressure ulcer, stage 2 ([place]) 06/18/2019  . Bacteremia due to group B Streptococcus 06/17/2019  . SIRS (systemic inflammatory response syndrome) ([place]) 06/16/2019  . Leg swelling   . Decompensated hepatic cirrhosis ([place]) 05/22/2019  . Anasarca 05/19/2019  . CRI (chronic renal insufficiency), stage 3 (moderate) ([place]) 01/30/2019  . Iron deficiency anemia due to chronic blood loss 11/02/2018  . Coronary artery disease of bypass graft of native heart with stable angina pectoris ([place]) 11/02/2018  . Pure hypercholesterolemia 11/02/2018  . Biventricular automatic implantable cardioverter defibrillator in situ 11/02/2018  . GIB (gastrointestinal bleeding) 09/14/2018  . Hypotension 08/14/2018  . Chronic systolic CHF (congestive heart failure) ([place]) 08/12/2018  . Acute on chronic  combined systolic and diastolic CHF (congestive heart failure) ([place])   . GI bleed 05/26/2018  . Occult GI bleeding 05/25/2018  . Normocytic anemia 05/25/2018  . Elevated troponin 05/25/2018  . Lymphedema 02/28/2018  . Demand ischemia ([place]) 07/15/2016  . Weakness 07/15/2016  . Fatigue 07/15/2016  . Stroke (cerebrum) ([place]) 10/31/2015  . Bulbous urethral stricture 09/18/2015  . Bilateral deafness 08/19/2015  . Bilateral cataracts 08/19/2015  . Acid reflux 08/19/2015  . Mixed hyperlipidemia 08/19/2015  . Essential hypertension 08/19/2015  . Myocardial infarction ([place]) 08/19/2015  . Calculus of kidney 08/19/2015  . Pacemaker 08/19/2015  . Gastroduodenal ulcer 08/19/2015  . Dupuytren's contracture of foot 08/19/2015  . Malignant neoplasm of prostate ([place]) 08/19/2015  . Microhematuria 08/19/2015  . CAD S/P multiple PCIs 02/22/2015  . Status post coronary artery bypass grafting 02/22/2015  . Benign essential HTN 04/02/2014  . Hematochezia 02/20/2014  . Tricuspid valve regurgitation, nonrheumatic 02/20/2014  . Mobitz type II atrioventricular block 12/12/2013  . Long term current use of anticoagulant 10/18/2013  . Cardiomyopathy, ischemic 11/11/2012  . Permanent atrial fibrillation ([place]) 11/11/2012  . Biventricular ICD in place 11/11/2012  . Chronic combined systolic and diastolic heart failure ([place]) 11/11/2012    Palliative Care Assessment & Plan   Recommendations/Plan:  Patient is currently in dying process and is not stable for transport to an outside facility. Continuing comfort care with anticipated hospital death.     Code Status:    Code Status Orders  (From admission, onward)         Start     Ordered   02/13/20 1348  Do not attempt resuscitation (DNR)  Continuous       Question Answer Comment  In the event of cardiac or respiratory ARREST Do not call a "code blue"   In the event of cardiac or respiratory ARREST Do not perform Intubation, CPR, defibrillation or  ACLS   In the event of cardiac or respiratory ARREST Use medication by any route, position, wound care, and other measures to relive pain and suffering. May use oxygen, suction and manual treatment of airway obstruction as needed for comfort.   Comments MOST form in chart.  02/13/20 1351        Code Status History    Date Active Date Inactive Code Status Order ID Comments User Context   02/12/2020 1611 02/13/2020 1351 DNR [id number]  Jules Husbands, MD Inpatient   02/02/2020 2028 02/12/2020 1610 Full Code [id number]  Carlus Pavlov Inpatient   01/26/2020 1756 01/31/2020 1937 Full Code [id number]  Val Riles, MD Inpatient   01/15/2020 0827 01/21/2020 1848 Full Code [id number]  Collier Bullock, MD ED   08/25/2019 2025 09/13/2019 0016 Full Code [id number]  [name], MD Inpatient   08/23/2019 1729 08/25/2019 2025 Full Code [id number]  [name] Chaco, PA-C Inpatient   08/19/2019 0111 08/23/2019 1729 Full Code [id number]  Ivor Costa, MD Inpatient   06/17/2019 0007 06/25/2019 1925 Full Code [id number]  Gwynne Edinger, MD ED   05/19/2019 1918 05/30/2019 2312 Full Code [id number]  Vashti Hey, MD ED   09/14/2018 0603 09/16/2018 1641 Full Code [id number]  Harrie Foreman, MD Inpatient   08/12/2018 0344 08/17/2018 1736 Full Code [id number]  Etta Quill, DO ED   05/25/2018 2042 05/28/2018 1654 Full Code [id number]  Vianne Bulls, MD ED   07/15/2016 0929 07/15/2016 1636 Full Code [id number]  Bettey Costa, MD Inpatient   10/31/2015 1444 11/01/2015 1921 Full Code [id number]  Nicholes Mango, MD Inpatient   09/25/2013 1137 09/26/2013 1502 Full Code [id number]  Sherren Mocha, MD Inpatient   09/21/2013 1444 09/25/2013 1137 Full Code [id number]  Sherren Mocha, MD Inpatient   12/16/2012 1648 12/17/2012 1639 Full Code [id number]  [name], MD Inpatient   Advance Care Planning Activity       Prognosis:   Hours - Days   Thank you for allowing the Palliative Medicine Team to assist in the care of  this patient.   Total Time 15 min Prolonged Time Billed no      Greater than 50%  of this time was spent counseling and coordinating care related to the above assessment and plan.  Asencion Gowda, NP  Please contact Palliative Medicine Team phone at [phone] for questions and concerns.

## 2020-02-16 NOTE — Progress Notes (Signed)
[place] Hospital Day(s): 10.   Post op day(s): 10 Days Post-Op.   Interval History:  Patient seen and examined No acute events or new complaints overnight. Patient on comfort measures only He does not appear uncomfortable No family present  Vital signs in last 24 hours: [min-max] current  Temp:  [98 F (36.7 C)] 98 F (36.7 C) (12/02 0820) Pulse Rate:  [70] 70 (12/02 0820) Resp:  [15] 15 (12/02 0820) BP: (101)/(57) 101/57 (12/02 0820) SpO2:  [99 %] 99 % (12/02 0820)     Height: 6\' 2"  (188 cm) Weight: 75.6 kg BMI (Calculated): 21.39   Intake/Output last 2 shifts:  No intake/output data recorded.   Physical Exam:  Constitutional:laying in bed, cachetic, appears comfortable Respiratory: short and shallow breathing Cardiovascular: regular rate and sinus rhythm  Gastrointestinal:JP in the RLQ, output is nowbilious, jejunostomy tube in the left mid-abdomen Integumentary:Laparoscopic and laparotomy incisions are CDI with staples, no drainage or erythema  Labs:  CBC Latest Ref Rng & Units 02/13/2020 02/10/2020 02/09/2020  WBC 4.0 - 10.5 K/uL 13.0(H) 4.6 6.9  Hemoglobin 13.0 - 17.0 g/dL 10.3(L) 8.2(L) 7.9(L)  Hematocrit 39 - 52 % 34.3(L) 26.6(L) 26.4(L)  Platelets 150 - 400 K/uL 339 244 230   CMP Latest Ref Rng & Units 02/13/2020 02/13/2020 02/12/2020  Glucose 70 - 99 mg/dL 149(H) - 143(H)  BUN 8 - 23 mg/dL 47(H) - 32(H)  Creatinine 0.61 - 1.24 mg/dL 1.91(H) - 1.09  Sodium 135 - 145 mmol/L 146(H) 148(H) 154(H)  Potassium 3.5 - 5.1 mmol/L 4.0 - 3.7  Chloride 98 - 111 mmol/L 109 - 116(H)  CO2 22 - 32 mmol/L 23 - 28  Calcium 8.9 - 10.3 mg/dL 7.9(L) - 8.2(L)  Total Protein 6.5 - 8.1 g/dL 5.3(L) - -  Total Bilirubin 0.3 - 1.2 mg/dL 1.4(H) - -  Alkaline Phos 38 - 126 U/L 118 - -  AST 15 - 41 U/L 25 - -  ALT 0 - 44 U/L 13 - -     Imaging studies: No new pertinent imaging studies   Assessment/Plan:  85 y.o. male 10 Days  Post-Op s/p exploratory laparotomy, duodenal ulcer repair with Phillip Heal patch, pyloric exclusiongastrojejunostomy and omega loop, feeding jejunostomy, and open cholecystectomyfor perforated duodenal ulcer, complicated bya multitudepertinent comorbidities.   - Transitioned to comfort measures on 11/30. Patient is in dying process. Continue with comfort measures at this time. Appreciate palliative care assistance. Anticipate hospital death.              - We will be available as needed should family have questions  All of the above findings and recommendations were discussed with the medical team.  -- Edison Simon, PA-C [facility] 02/16/2020, 7:32 AM [phone] M-F: 7am - 4pm

## 2020-02-26 ENCOUNTER — Ambulatory Visit: Payer: PPO | Admitting: Medical

## 2020-02-28 LAB — FUNGAL ORGANISM REFLEX

## 2020-02-28 LAB — FUNGUS CULTURE WITH STAIN

## 2020-02-28 LAB — FUNGUS CULTURE RESULT

## 2020-03-13 LAB — ACID FAST CULTURE WITH REFLEXED SENSITIVITIES (MYCOBACTERIA): Acid Fast Culture: NEGATIVE

## 2020-03-16 NOTE — Progress Notes (Signed)
Pt expired on Mar 06, 2020 at 1:45am, RN to pronounced, pronounced by Kadyn Guild Erskine Speed, RN/Jeritza Almoms, RN. [place] [facility] contacted at 1:50am and waiting for a call back to confirm if patient was a donor. Contact patient son Cassie Henkels at [phone] notified of patient expiring on 03/06/20 at 1:455am. Dionne notified nurse he had to go and tell mother, nurse writer advise son they can come and view body if like but if theu decide not come, to call back and notify nurse what the funeral arrangements are.

## 2020-03-16 NOTE — Discharge Summary (Signed)
Patient ID: Thomas [name] MRN: [id number] DOB/AGE: [birth year] 84 y.o.  Admit date: 01/18/2020 Discharge date: 03-02-20   Discharge Diagnoses:  Active Problems:   Cardiomyopathy, ischemic   Permanent atrial fibrillation (HCC)   Chronic combined systolic and diastolic heart failure (HCC)   Benign essential HTN   Bilateral deafness   Mixed hyperlipidemia   Coronary artery disease of bypass graft of native heart with stable angina pectoris (HCC)   CRI (chronic renal insufficiency), stage 3 (moderate) (HCC)   Perforated duodenal ulcer (HCC)   Protein-calorie malnutrition, severe   Perforated ulcer (HCC)   Hypernatremia   Peritonitis ([place])   Procedures: 1.  Exploratory laparotomy with Phillip Heal patch pyloric exclusion procedure with gastrojejunostomy and feeding jejunostomy with drain placement  Hospital Course:  Thomas [name] is a 86 year old male evaluated in the emergency room for an acute abdomen and work-up revealing evidence of perforation from a large duodenal ulcer.  When I evaluated the patient the patient had obvious peritonitis and was septic.  Of note the patient had significant comorbidities to include: Coronary artery disease, status post CABG, atrial fibrillation, congestive heart failure, he does have a biventricular ICD cirrhosis, chronic kidney disease stage III, history of AAA, hypertension. He also had a recent hospitalization for an upper GI bleed from a duodenal ulcer that required embolization by vascular surgery. The bleeding is stopped but this likely contributed to the worsening of this duodenal ulcer.  After extensive discussion with the patient and the family about the severity of this disease they choose to proceed with emergent operation.  They fully understood that this was going to be a surgery in an attempt to save his life. He underwent an attempted robotic repair with conversion to open repair operative findings revealed a giant duodenal ulcer on the  proximal to mid second portion of duodenum, lateral and posterior wall.  Very difficult to repair.  Defect measured greater than 50% of the duodenal circumference.  I felt that at that time the best operation was going to do a [place] patch to try to cover the defect and then exclude the stomach from the defect area therefore I did a pyloric exclusion with a gastrojejunostomy and jejunostomy feeding tube.  Patient was transferred to the ICU where he did well and for the first week or so the okay.  He was transferred to the floor where his feeds were advanced to goal. He did remove his NG tube.  He underwent an upper GI series showing no evidence of a leak.  Subsequently his drainage started to put out bilious output approximately 1 week after the repair.  His CT scan was performed showing evidence of a large defect on the second portion of the duodenum.  He started to deteriorate with delirium altered mental status and worsening sepsis.  I had a lengthy discussion with his son and also his wife.  Unfortunately his kidney and liver function started to deteriorate and he started to have a multiorgan failure.  Given all his comorbidities, his advanced age malnutrition and fragility I was very hesitant about doing multiple major surgical intervention that will not change any outcome.  I discussed with the family that my personal belief was that this was not a survivable injury because in an attempt to save his life he will have to require prolonged hospitalization with prolonged ICU care and probably ventilatory dependence, with multiple operative intervention to include open abdomen and tracheostomy. Even with those heroic events changes of survival were close to  zero.  Thomas [name] who is his son was very clear that his dad did not want to suffer and persue heroic measures.  He knew his dad was a DNR and "was at peace with god".  More importantly when I talked to Thomas [name] he also expressed to me that he was very tired  and he did not wanted to continue suffering.  We consulted palliative care and they help [place] navigate end-of-life issues and goals of care.  Patient was made comfort care.  Patient during the last days of his life did not suffer and was very comfortable.  The patient expired on 02/25/2020 at 1:45 AM.  He was pronounced by Thomas [name] and Thomas [name]. I again had a conversation over the phone with Thomas [name] wife and his other son.  Given that I was not on call and it was a weekend I talked to him over the phone via an interpreter.  I again explained to them the hospital course and the cause of death.  Cause of death was a giant duodenal ulcer that failed major operative intervention on a very frail, debilitated malnourished patient with significant comorbidities to include heart failure, coronary artery disease, peripheral vascular disease, cirrhosis, chronic kidney insufficiency. All the questions were answered and they were appreciative of the care that we gave him at [facility]. I do want to note that there were barriers in communications because both Thomas [name] and his wife were death.  I was available to my best of abilities and use video interpreter as well as in person interpreter. This was not an easy task but I was available to as much as my capacity.  The nurses also were very supportive of his care and try to accommodate the Lapinsky family to the best of our abilities.  I expressed my condolences to the family and also clarified that because of his advanced disease he was not a candidate to donate any of his organs.   Consults: Critical care, Hospitalist service and palliative medicine  Disposition:  PT died and cremation services to arrange body.  Cause of death: giant duodenal ulcer and multti organ failure       Caroleen Hamman, MD FACS

## 2020-03-16 DEATH — deceased
